# Patient Record
Sex: Male | Born: 1950
Health system: Southern US, Community
[De-identification: ages and names within clinical notes are randomized; demographics above are authoritative.]

## PROBLEM LIST (undated history)

## (undated) DIAGNOSIS — I10 Essential (primary) hypertension: Secondary | ICD-10-CM

## (undated) DIAGNOSIS — C9590 Leukemia, unspecified not having achieved remission: Secondary | ICD-10-CM

## (undated) DIAGNOSIS — E119 Type 2 diabetes mellitus without complications: Secondary | ICD-10-CM

## (undated) DIAGNOSIS — E785 Hyperlipidemia, unspecified: Secondary | ICD-10-CM

## (undated) HISTORY — PX: TOOTH EXTRACTION: SUR596

## (undated) HISTORY — PX: TONSILLECTOMY: SUR1361

## (undated) HISTORY — DX: Type 2 diabetes mellitus without complications: E11.9

## (undated) HISTORY — PX: CATARACT EXTRACTION: SUR2

## (undated) HISTORY — DX: Hyperlipidemia, unspecified: E78.5

---

## 2016-05-11 DIAGNOSIS — E119 Type 2 diabetes mellitus without complications: Secondary | ICD-10-CM | POA: Diagnosis not present

## 2016-05-22 ENCOUNTER — Ambulatory Visit
Admission: EM | Admit: 2016-05-22 | Discharge: 2016-05-22 | Disposition: A | Discharge status: Home / Self Care | Payer: PPO | Attending: Emergency Medicine | Admitting: Emergency Medicine

## 2016-05-22 DIAGNOSIS — I872 Venous insufficiency (chronic) (peripheral): Secondary | ICD-10-CM | POA: Diagnosis not present

## 2016-05-22 DIAGNOSIS — L03115 Cellulitis of right lower limb: Secondary | ICD-10-CM | POA: Diagnosis not present

## 2016-05-22 DIAGNOSIS — L97229 Non-pressure chronic ulcer of left calf with unspecified severity: Secondary | ICD-10-CM | POA: Diagnosis not present

## 2016-05-22 DIAGNOSIS — T148XXA Other injury of unspecified body region, initial encounter: Secondary | ICD-10-CM

## 2016-05-22 DIAGNOSIS — L97219 Non-pressure chronic ulcer of right calf with unspecified severity: Secondary | ICD-10-CM | POA: Diagnosis not present

## 2016-05-22 HISTORY — DX: Leukemia, unspecified not having achieved remission: C95.90

## 2016-05-22 MED ORDER — CEFTRIAXONE SODIUM 1 G IJ SOLR
1.0000 g | Freq: Once | INTRAMUSCULAR | Status: AC
Start: 2016-05-22 — End: 2016-05-22
  Administered 2016-05-22: 1 g via INTRAMUSCULAR

## 2016-05-22 MED ORDER — SILVER SULFADIAZINE 1 % EX CREA
TOPICAL_CREAM | Freq: Once | CUTANEOUS | Status: AC
  Administered 2016-05-22: 16:00:00 via TOPICAL

## 2016-05-22 MED ORDER — DOXYCYCLINE HYCLATE 100 MG PO CAPS: 100.0000 mg | ORAL_CAPSULE | Freq: Two times a day (BID) | ORAL | 0 refills | Status: DC

## 2016-05-22 NOTE — Discharge Instructions (Signed)
Follow-up with the [facility] ER tomorrow. You will need to get involved with a wound care center, and you can either do this through the [place] or you can go to [facility] wound care center. This will take some time to heal. Finish the antibiotics unless your doctor tells you to stop. Go to the ER if you get acutely worse tonight.

## 2016-05-22 NOTE — ED Triage Notes (Signed)
Pt has large blisters on his lower legs that are oozing fluid. He does have peripheral vascular issues. He is a diabetic.

## 2016-05-22 NOTE — ED Provider Notes (Signed)
HPI  SUBJECTIVE:  [name] is a 66 y.o. male who presents with  Bilateral lower extremity "water blisters" starting 2 days ago. He reports clear drainage. He reports intermittent minutes long leg pain described as soreness the right lower extremity. Is worse with walking. He tried draining the blister multiple times. No alleviating factors. He reports redness in his right lower extremity, but no fevers, increased temperature, body aches are generalized weakness. He has had similar blisters like this before, but they have been smaller and has not had localized erythema. He denies increased swelling in his legs recently.  Past medical history of leukemia, diabetes, hypertension, , lower extremity edema for he takes Lasix. No history of MRSA, recurrent skin infections, peripheral vascular disease, hypercholesterolemia, coronary artery disease, congestive heart failure, peripheral neuropathy. PMD: Dr. Arnoldo Morale at the [facility].   Past Medical History:  Diagnosis Date  . Leukemia ([place])     History reviewed. No pertinent surgical history.  History reviewed. No pertinent family history.  Social History  Substance Use Topics  . Smoking status: Former Research scientist (life sciences)  . Smokeless tobacco: Never Used  . Alcohol use No    No current facility-administered medications for this encounter.   Current Outpatient Prescriptions:  .  bisoprolol-hydrochlorothiazide (ZIAC) 10-6.25 MG tablet, Take 1 tablet by mouth daily., Disp: , Rfl:  .  furosemide (LASIX) 20 MG tablet, Take 20 mg by mouth., Disp: , Rfl:  .  hydrALAZINE (APRESOLINE) 10 MG tablet, Take 10 mg by mouth 3 (three) times daily., Disp: , Rfl:  .  lisinopril-hydrochlorothiazide (PRINZIDE,ZESTORETIC) 20-12.5 MG tablet, Take 1 tablet by mouth daily., Disp: , Rfl:  .  metFORMIN (GLUCOPHAGE) 1000 MG tablet, Take 1,000 mg by mouth 2 (two) times daily with a meal., Disp: , Rfl:  .  pioglitazone (ACTOS) 15 MG tablet, Take 15 mg by mouth daily., Disp: , Rfl:   .  pravastatin (PRAVACHOL) 80 MG tablet, Take 80 mg by mouth daily., Disp: , Rfl:  .  doxycycline (VIBRAMYCIN) 100 MG capsule, Take 1 capsule (100 mg total) by mouth 2 (two) times daily. X 7 days, Disp: 14 capsule, Rfl: 0  No Known Allergies   ROS  As noted in HPI.   Physical Exam  BP (!) 146/66 (BP Location: Left Arm)   Pulse (!) 56   Temp 97.9 F (36.6 C) (Oral)   Resp 18   Ht 6\' 1"  (1.854 m)   Wt (!) 305 lb (138.3 kg)   SpO2 97%   BMI 40.24 kg/m   Constitutional: Well developed, well nourished, no acute distress Eyes:  EOMI, conjunctiva normal bilaterally HENT: Normocephalic, atraumatic,mucus membranes moist Respiratory: Normal inspiratory effort Cardiovascular: Normal rate GI: nondistended skin:Tender Flaccid blister right lower extremity measuring 13 x 15 cm with diffuse distal erythema. He has bilateral foot edema. Right foot is erythematous. Bilateral DP pulses 2+. Slightly increased temperature in the right lower extremity. Marked area of erythema with a marker for reference. 3 x3.5 centimeter flaccid blister draining serous fluid with no surrounding erythema on his left lower extremity. Marked this with a marker for reference.         Musculoskeletal: no deformities Neurologic: Alert & oriented x 3, no focal neuro deficits Psychiatric: Speech and behavior appropriate   ED Course   Medications  cefTRIAXone (ROCEPHIN) injection 1 g (1 g Intramuscular Given 05/22/16 1625)  silver sulfADIAZINE (SILVADENE) 1 % cream ( Topical Given 05/22/16 1625)    Orders Placed This Encounter  Procedures  . Wound  or Superficial Culture    Standing Status:   Standing    Number of Occurrences:   1    No results found for this or any previous visit (from the past 24 hour(s)). No results found.  ED Clinical Impression  Blister  Venous stasis ulcer of left calf without varicose veins, unspecified ulcer stage (HCC)  Venous stasis ulcer of right calf without varicose  veins, unspecified ulcer stage (HCC)  Cellulitis of right lower extremity   ED Assessment/Plan  presentation most consistent with blisters due to venous stasis. He appears to have a cellulitis of the right lower extremity. Giving 1 g Rocephin will start him on doxycycline. Sending off wound culture. He needs to follow-up with the [facility] either tomorrow or the next day, or he can follow up with [facility] wound care center. Advised patient that this will take some time to heal. Advised elevation, compression hose. He is to go to the ER for any acute worsening of his symptoms.   Discussed  MDM, plan and followup with patient. Discussed sn/sx that should prompt return to the ED. Patient agrees with plan.   Meds ordered this encounter  Medications  . metFORMIN (GLUCOPHAGE) 1000 MG tablet    Sig: Take 1,000 mg by mouth 2 (two) times daily with a meal.  . pioglitazone (ACTOS) 15 MG tablet    Sig: Take 15 mg by mouth daily.  . hydrALAZINE (APRESOLINE) 10 MG tablet    Sig: Take 10 mg by mouth 3 (three) times daily.  . bisoprolol-hydrochlorothiazide (ZIAC) 10-6.25 MG tablet    Sig: Take 1 tablet by mouth daily.  . furosemide (LASIX) 20 MG tablet    Sig: Take 20 mg by mouth.  Marland Kitchen lisinopril-hydrochlorothiazide (PRINZIDE,ZESTORETIC) 20-12.5 MG tablet    Sig: Take 1 tablet by mouth daily.  . pravastatin (PRAVACHOL) 80 MG tablet    Sig: Take 80 mg by mouth daily.  . cefTRIAXone (ROCEPHIN) injection 1 g  . doxycycline (VIBRAMYCIN) 100 MG capsule    Sig: Take 1 capsule (100 mg total) by mouth 2 (two) times daily. X 7 days    Dispense:  14 capsule    Refill:  0  . silver sulfADIAZINE (SILVADENE) 1 % cream    *This clinic note was created using [employer]. Therefore, there may be occasional mistakes despite careful proofreading.  ?   Melynda Ripple, MD 05/22/16 [phone]

## 2016-05-23 ENCOUNTER — Encounter: Payer: Self-pay | Admitting: *Deleted

## 2016-05-23 ENCOUNTER — Encounter: Payer: PPO | Attending: Surgery | Admitting: Surgery

## 2016-05-23 ENCOUNTER — Ambulatory Visit
Admission: EM | Admit: 2016-05-23 | Discharge: 2016-05-23 | Disposition: A | Discharge status: Home / Self Care | Payer: PPO | Attending: Family Medicine | Admitting: Family Medicine

## 2016-05-23 DIAGNOSIS — L97209 Non-pressure chronic ulcer of unspecified calf with unspecified severity: Secondary | ICD-10-CM

## 2016-05-23 DIAGNOSIS — I890 Lymphedema, not elsewhere classified: Secondary | ICD-10-CM | POA: Diagnosis not present

## 2016-05-23 DIAGNOSIS — Z79899 Other long term (current) drug therapy: Secondary | ICD-10-CM | POA: Insufficient documentation

## 2016-05-23 DIAGNOSIS — E1140 Type 2 diabetes mellitus with diabetic neuropathy, unspecified: Secondary | ICD-10-CM | POA: Insufficient documentation

## 2016-05-23 DIAGNOSIS — I110 Hypertensive heart disease with heart failure: Secondary | ICD-10-CM | POA: Diagnosis not present

## 2016-05-23 DIAGNOSIS — I509 Heart failure, unspecified: Secondary | ICD-10-CM | POA: Insufficient documentation

## 2016-05-23 DIAGNOSIS — I83002 Varicose veins of unspecified lower extremity with ulcer of calf: Secondary | ICD-10-CM

## 2016-05-23 DIAGNOSIS — L97211 Non-pressure chronic ulcer of right calf limited to breakdown of skin: Secondary | ICD-10-CM | POA: Diagnosis not present

## 2016-05-23 DIAGNOSIS — L03115 Cellulitis of right lower limb: Secondary | ICD-10-CM | POA: Diagnosis not present

## 2016-05-23 DIAGNOSIS — I2510 Atherosclerotic heart disease of native coronary artery without angina pectoris: Secondary | ICD-10-CM | POA: Diagnosis not present

## 2016-05-23 DIAGNOSIS — Z7984 Long term (current) use of oral hypoglycemic drugs: Secondary | ICD-10-CM | POA: Diagnosis not present

## 2016-05-23 DIAGNOSIS — Z87891 Personal history of nicotine dependence: Secondary | ICD-10-CM | POA: Insufficient documentation

## 2016-05-23 DIAGNOSIS — E11622 Type 2 diabetes mellitus with other skin ulcer: Secondary | ICD-10-CM | POA: Insufficient documentation

## 2016-05-23 DIAGNOSIS — L97221 Non-pressure chronic ulcer of left calf limited to breakdown of skin: Secondary | ICD-10-CM | POA: Insufficient documentation

## 2016-05-23 HISTORY — DX: Essential (primary) hypertension: I10

## 2016-05-23 MED ORDER — CEFTRIAXONE SODIUM 1 G IJ SOLR
1.0000 g | Freq: Once | INTRAMUSCULAR | Status: AC
  Administered 2016-05-23: 1 g via INTRAMUSCULAR

## 2016-05-23 NOTE — ED Triage Notes (Signed)
Seen here yesterday for cellulitis to both lower legs. Here for wound check.

## 2016-05-23 NOTE — ED Provider Notes (Signed)
[facility]    CSN: [id number] Arrival date & time: 05/23/16  0848     History   Chief Complaint Chief Complaint  Patient presents with  . Wound Check    HPI [name] is a 66 y.o. male.   66 yo diabetic male seen here yesterday with a venous stasis ulcers and right lower extremity cellulitis. He was given Rocephin 1gm IM and started on oral antibiotic. Here for wound recheck. States it feels slightly improved today. Denies any fevers, chills.    The history is provided by the patient.  Wound Check     Past Medical History:  Diagnosis Date  . Hypertension   . Leukemia ([place])     There are no active problems to display for this patient.   History reviewed. No pertinent surgical history.     Home Medications    Prior to Admission medications   Medication Sig Start Date End Date Taking? Authorizing Provider  bisoprolol-hydrochlorothiazide (ZIAC) 10-6.25 MG tablet Take 1 tablet by mouth daily.   Yes Historical Provider, MD  doxycycline (VIBRAMYCIN) 100 MG capsule Take 1 capsule (100 mg total) by mouth 2 (two) times daily. X 7 days 05/22/16  Yes Melynda Ripple, MD  furosemide (LASIX) 20 MG tablet Take 20 mg by mouth.   Yes Historical Provider, MD  hydrALAZINE (APRESOLINE) 10 MG tablet Take 10 mg by mouth 3 (three) times daily.   Yes Historical Provider, MD  lisinopril-hydrochlorothiazide (PRINZIDE,ZESTORETIC) 20-12.5 MG tablet Take 1 tablet by mouth daily.   Yes Historical Provider, MD  metFORMIN (GLUCOPHAGE) 1000 MG tablet Take 1,000 mg by mouth 2 (two) times daily with a meal.   Yes Historical Provider, MD  pioglitazone (ACTOS) 15 MG tablet Take 15 mg by mouth daily.   Yes Historical Provider, MD  pravastatin (PRAVACHOL) 80 MG tablet Take 80 mg by mouth daily.   Yes Historical Provider, MD    Family History History reviewed. No pertinent family history.  Social History Social History  Substance Use Topics  . Smoking status: Former Research scientist (life sciences)  .  Smokeless tobacco: Never Used  . Alcohol use No     Allergies   Patient has no known allergies.   Review of Systems Review of Systems   Physical Exam Triage Vital Signs ED Triage Vitals  Enc Vitals Group     BP 05/23/16 0902 (!) 135/55     Pulse Rate 05/23/16 0902 60     Resp 05/23/16 0902 16     Temp 05/23/16 0902 98.8 F (37.1 C)     Temp Source 05/23/16 0902 Oral     SpO2 05/23/16 0902 95 %     Weight 05/23/16 0905 (!) 310 lb (140.6 kg)     Height 05/23/16 0905 6\' 1"  (1.854 m)     Head Circumference --      Peak Flow --      Pain Score --      Pain Loc --      Pain Edu? --      Excl. in [place]? --    No data found.   Updated Vital Signs BP (!) 135/55 (BP Location: Left Arm)   Pulse 60   Temp 98.8 F (37.1 C) (Oral)   Resp 16   Ht 6\' 1"  (1.854 m)   Wt (!) 310 lb (140.6 kg)   SpO2 95%   BMI 40.90 kg/m   Visual Acuity Right Eye Distance:   Left Eye Distance:   Bilateral Distance:  Right Eye Near:   Left Eye Near:    Bilateral Near:     Physical Exam  Constitutional: He appears well-developed and well-nourished. No distress.  Skin: He is not diaphoretic.  Large venous stasis blister and ulcer with surrounding tenderness and skin blanchable erythema on right lower leg; Venous stasis blister/ulcer to left posterior lower leg  Nursing note and vitals reviewed.    UC Treatments / Results  Labs (all labs ordered are listed, but only abnormal results are displayed) Labs Reviewed - No data to display  EKG  EKG Interpretation None       Radiology No results found.  Procedures Procedures (including critical care time)  Medications Ordered in UC Medications  cefTRIAXone (ROCEPHIN) injection 1 g (1 g Intramuscular Given 05/23/16 0931)     Initial Impression / Assessment and Plan / UC Course  I have reviewed the triage vital signs and the nursing notes.  Pertinent labs & imaging results that were available during my care of the patient were  reviewed by me and considered in my medical decision making (see chart for details).  Clinical Course       Final Clinical Impressions(s) / UC Diagnoses   Final diagnoses:  Venous stasis ulcer of calf, unspecified laterality, unspecified ulcer stage, unspecified whether varicose veins present (HCC)  Cellulitis of right leg    New Prescriptions New Prescriptions   No medications on file   1. diagnosis reviewed with patient 2. Continue current oral antibiotic 3. Recommend follow up at wound care clinic ([facility]); appt today at 1:30    Norval Gable, MD 05/23/16 [phone]

## 2016-05-23 NOTE — Progress Notes (Signed)
NEVYN, GARG ([id number]) Visit Report for 05/23/2016 Allergy List Details Patient Name: [name], [name] Date of Service: 05/23/2016 1:30 PM Medical Record Number: [id number] Patient Account Number: [id number] Date of Birth/Sex: 1951-02-02 (66 y.o. Male) Treating RN: Montey Hora Primary Care Physician: PATIENT, NO Other Clinician: Referring Physician: Treating Physician/Extender: Frann Rider in Treatment: 0 Allergies Active Allergies No Known Drug Allergies Allergy Notes Electronic Signature(s) Signed: 05/23/2016 3:52:38 PM By: Montey Hora Entered By: Montey Hora on 05/23/2016 13:39:34 [name] ([id number]) -------------------------------------------------------------------------------- [place] Details Patient Name: [name] Date of Service: 05/23/2016 1:30 PM Medical Record Number: [id number] Patient Account Number: [id number] Date of Birth/Sex: 1951/04/29 (66 y.o. Male) Treating RN: Montey Hora Primary Care Physician: PATIENT, NO Other Clinician: Referring Physician: Treating Physician/Extender: Frann Rider in Treatment: 0 Visit Information Patient Arrived: Ambulatory Arrival Time: 13:35 Accompanied By: self Transfer Assistance: None Patient Identification Verified: Yes Secondary Verification Process Yes Completed: Patient Has Alerts: Yes Patient Alerts: DMII NO ABI RIGHT R/T PAIN Electronic Signature(s) Signed: 05/23/2016 3:52:38 PM By: Montey Hora Entered By: Montey Hora on 05/23/2016 14:18:50 [name] ([id number]) -------------------------------------------------------------------------------- Clinic Level of Care Assessment Details Patient Name: [name] Date of Service: 05/23/2016 1:30 PM Medical Record Number: [id number] Patient Account Number: [id number] Date of Birth/Sex: 18-Jun-1950 (66 y.o. Male) Treating RN: Montey Hora Primary Care Physician: PATIENT, NO Other Clinician: Referring  Physician: Treating Physician/Extender: Frann Rider in Treatment: 0 Clinic Level of Care Assessment Items TOOL 2 Quantity Score []  - Use when only an EandM is performed on the INITIAL visit 0 ASSESSMENTS - Nursing Assessment / Reassessment X - General Physical Exam (combine w/ comprehensive assessment (listed just 1 20 below) when performed on new pt. evals) X - Comprehensive Assessment (HX, ROS, Risk Assessments, Wounds Hx, etc.) 1 25 ASSESSMENTS - Wound and Skin Assessment / Reassessment []  - Simple Wound Assessment / Reassessment - one wound 0 X - Complex Wound Assessment / Reassessment - multiple wounds 2 5 []  - Dermatologic / Skin Assessment (not related to wound area) 0 ASSESSMENTS - Ostomy and/or Continence Assessment and Care []  - Incontinence Assessment and Management 0 []  - Ostomy Care Assessment and Management (repouching, etc.) 0 PROCESS - Coordination of Care X - Simple Patient / Family Education for ongoing care 1 15 []  - Complex (extensive) Patient / Family Education for ongoing care 0 X - Staff obtains Programmer, systems, Records, Test Results / Process Orders 1 10 []  - Staff telephones HHA, Nursing Homes / Clarify orders / etc 0 []  - Routine Transfer to another Facility (non-emergent condition) 0 []  - Routine Hospital Admission (non-emergent condition) 0 []  - New Admissions / Biomedical engineer / Ordering NPWT, Apligraf, etc. 0 []  - Emergency Hospital Admission (emergent condition) 0 X - Simple Discharge Coordination 1 10 [name], [name] ([id number]) []  - Complex (extensive) Discharge Coordination 0 PROCESS - Special Needs []  - Pediatric / Minor Patient Management 0 []  - Isolation Patient Management 0 []  - Hearing / Language / Visual special needs 0 []  - Assessment of Community assistance (transportation, D/C planning, etc.) 0 []  - Additional assistance / Altered mentation 0 []  - Support Surface(s) Assessment (bed, cushion, seat, etc.) 0 INTERVENTIONS - Wound  Cleansing / Measurement X - Wound Imaging (photographs - any number of wounds) 1 5 []  - Wound Tracing (instead of photographs) 0 []  - Simple Wound Measurement - one wound 0 X - Complex Wound Measurement - multiple wounds 2 5 []  - Simple Wound Cleansing - one wound 0 X - Complex Wound  Cleansing - multiple wounds 2 5 INTERVENTIONS - Wound Dressings []  - Small Wound Dressing one or multiple wounds 0 []  - Medium Wound Dressing one or multiple wounds 0 X - Large Wound Dressing one or multiple wounds 2 20 []  - Application of Medications - injection 0 INTERVENTIONS - Miscellaneous []  - External ear exam 0 []  - Specimen Collection (cultures, biopsies, blood, body fluids, etc.) 0 []  - Specimen(s) / Culture(s) sent or taken to Lab for analysis 0 []  - Patient Transfer (multiple staff / Harrel Lemon Lift / Similar devices) 0 []  - Simple Staple / Suture removal (25 or less) 0 []  - Complex Staple / Suture removal (26 or more) 0 [name], [name] ([id number]) []  - Hypo / Hyperglycemic Management (close monitor of Blood Glucose) 0 X - Ankle / Brachial Index (ABI) - do not check if billed separately 1 15 Has the patient been seen at the hospital within the last three years: Yes Total Score: 170 Level Of Care: New/Established - Level 5 Electronic Signature(s) Signed: 05/23/2016 3:52:38 PM By: Montey Hora Entered By: Montey Hora on 05/23/2016 14:35:46 [name] ([id number]) -------------------------------------------------------------------------------- Encounter Discharge Information Details Patient Name: [name] Date of Service: 05/23/2016 1:30 PM Medical Record Number: [id number] Patient Account Number: [id number] Date of Birth/Sex: 1950/09/22 (66 y.o. Male) Treating RN: Montey Hora Primary Care Physician: PATIENT, NO Other Clinician: Referring Physician: Treating Physician/Extender: Frann Rider in Treatment: 0 Encounter Discharge Information Items Discharge Pain Level:  0 Discharge Condition: Stable Ambulatory Status: Ambulatory Discharge Destination: Home Transportation: Private Auto Accompanied By: self Schedule Follow-up Appointment: Yes Medication Reconciliation completed No and provided to Patient/Care Tamee Battin: Patient Clinical Summary of Care: Declined Electronic Signature(s) Signed: 05/23/2016 3:03:24 PM By: Sharon Mt Entered By: Sharon Mt on 05/23/2016 15:03:24 [name] ([id number]) -------------------------------------------------------------------------------- Lower Extremity Assessment Details Patient Name: [name] Date of Service: 05/23/2016 1:30 PM Medical Record Number: [id number] Patient Account Number: [id number] Date of Birth/Sex: [birth year] (66 y.o. Male) Treating RN: Montey Hora Primary Care Physician: PATIENT, NO Other Clinician: Referring Physician: Treating Physician/Extender: Frann Rider in Treatment: 0 Edema Assessment Assessed: [Left: No] [Right: No] Edema: [Left: Yes] [Right: Yes] Calf Left: Right: Point of Measurement: 34 cm From Medial Instep 41.2 cm 42.5 cm Ankle Left: Right: Point of Measurement: 12 cm From Medial Instep 29 cm 29.5 cm Vascular Assessment Pulses: Dorsalis Pedis Palpable: [Left:Yes] [Right:Yes] Doppler Audible: [Left:Yes] [Right:Yes] Posterior Tibial Palpable: [Left:No] [Right:No] Doppler Audible: [Left:Yes] [Right:Yes] Extremity colors, hair growth, and conditions: Extremity Color: [Left:Hyperpigmented] [Right:Hyperpigmented] Hair Growth on Extremity: [Left:No] [Right:No] Temperature of Extremity: [Left:Warm] [Right:Warm] Capillary Refill: [Left:< 3 seconds] [Right:< 3 seconds] Blood Pressure: Brachial: [Left:124] Dorsalis Pedis: 146 [Left:Dorsalis Pedis:] Ankle: Posterior Tibial: 170 [Left:Posterior Tibial: 1.37] Toe Nail Assessment Left: Right: Thick: Yes Yes Discolored: No No Deformed: No No Improper Length and Hygiene: No No MAYNARD, PINTA  ([id number]) Electronic Signature(s) Signed: 05/23/2016 3:52:38 PM By: Montey Hora Entered By: Montey Hora on 05/23/2016 14:18:33 [name] ([id number]) -------------------------------------------------------------------------------- Multi Wound Chart Details Patient Name: [name] Date of Service: 05/23/2016 1:30 PM Medical Record Number: [id number] Patient Account Number: [id number] Date of Birth/Sex: 23-Aug-1950 (66 y.o. Male) Treating RN: Montey Hora Primary Care Physician: PATIENT, NO Other Clinician: Referring Physician: Treating Physician/Extender: Frann Rider in Treatment: 0 Vital Signs Height(in): 73 Pulse(bpm): 61 Weight(lbs): 326 Blood Pressure 126/49 (mmHg): Body Mass Index(BMI): 43 Temperature(F): 98.1 Respiratory Rate 16 (breaths/min): Photos: [N/A:N/A] Wound Location: Right Lower Leg - Medial Left Lower Leg - Medial N/A Wounding Event: Blister Blister N/A Primary  Etiology: Venous Leg Ulcer Venous Leg Ulcer N/A Comorbid History: Hypertension, Type II Hypertension, Type II N/A Diabetes, Neuropathy, Diabetes, Neuropathy, Received Chemotherapy Received Chemotherapy Date Acquired: 05/20/2016 05/20/2016 N/A Weeks of Treatment: 0 0 N/A Wound Status: Open Open N/A Measurements L x W x D 12.3x15.5x0.1 2.3x2.2x0.1 N/A (cm) Area (cm) : 149.736 3.974 N/A Volume (cm) : 14.974 0.397 N/A % Reduction in Area: 0.00% N/A N/A % Reduction in Volume: 0.00% N/A N/A Classification: Partial Thickness Partial Thickness N/A HBO Classification: Grade 1 Grade 1 N/A Exudate Amount: Large Large N/A Exudate Type: Serous Serous N/A Exudate Color: amber amber N/A Wound Margin: Flat and Intact Flat and Intact N/A Granulation Amount: Large (67-100%) Large (67-100%) N/A Granulation Quality: Red, Pink Pink N/A [place], Banyan ([id number]) Necrotic Amount: Small (1-33%) None Present (0%) N/A Exposed Structures: Fascia: No Fascia: No N/A Fat: No Fat: No Tendon:  No Tendon: No Muscle: No Muscle: No Joint: No Joint: No Bone: No Bone: No Limited to Skin Limited to Skin Breakdown Breakdown Epithelialization: Small (1-33%) Small (1-33%) N/A Periwound Skin Texture: Edema: No Edema: No N/A Excoriation: No Excoriation: No Induration: No Induration: No Callus: No Callus: No Crepitus: No Crepitus: No Fluctuance: No Fluctuance: No Friable: No Friable: No Rash: No Rash: No Scarring: No Scarring: No Periwound Skin Moist: Yes Moist: Yes N/A Moisture: Maceration: No Maceration: No Dry/Scaly: No Dry/Scaly: No Periwound Skin Color: Erythema: Yes Atrophie Blanche: No N/A Atrophie Blanche: No Cyanosis: No Cyanosis: No Ecchymosis: No Ecchymosis: No Erythema: No Hemosiderin Staining: No Hemosiderin Staining: No Mottled: No Mottled: No Pallor: No Pallor: No Rubor: No Rubor: No Erythema Location: Circumferential N/A N/A Tenderness on Yes Yes N/A Palpation: Wound Preparation: Ulcer Cleansing: Ulcer Cleansing: N/A Rinsed/Irrigated with Rinsed/Irrigated with Saline Saline Topical Anesthetic Topical Anesthetic Applied: Other: lidocaine Applied: Other: lidocaine 4% 4% Treatment Notes Electronic Signature(s) Signed: 05/23/2016 2:39:08 PM By: Christin Fudge MD, FACS Entered By: Christin Fudge on 05/23/2016 14:39:08 [name] ([id number]) -------------------------------------------------------------------------------- [place] Details Patient Name: [name] Date of Service: 05/23/2016 1:30 PM Medical Record Number: [id number] Patient Account Number: [id number] Date of Birth/Sex: [birth year] (65 y.o. Male) Treating RN: Montey Hora Primary Care Physician: PATIENT, NO Other Clinician: Referring Physician: Treating Physician/Extender: Frann Rider in Treatment: 0 Active Inactive Abuse / Safety / Falls / Self Care Management Nursing Diagnoses: Potential for falls Goals: Patient will remain injury  free Date Initiated: 05/23/2016 Goal Status: Active Interventions: Assess fall risk on admission and as needed Notes: Orientation to the Wound Care Program Nursing Diagnoses: Knowledge deficit related to the wound healing center program Goals: Patient/caregiver will verbalize understanding of the [facility] Date Initiated: 05/23/2016 Goal Status: Active Interventions: Provide education on orientation to the wound center Notes: Venous Leg Ulcer Nursing Diagnoses: Potential for venous Insuffiency (use before diagnosis confirmed) Goals: Non-invasive venous studies are completed as ordered Date Initiated: 05/23/2016 NYSEAN, PRATS ([id number]) Goal Status: Active Patient will maintain optimal edema control Date Initiated: 05/23/2016 Goal Status: Active Interventions: Assess peripheral edema status every visit. Compression as ordered Notes: Wound/Skin Impairment Nursing Diagnoses: Impaired tissue integrity Goals: Patient/caregiver will verbalize understanding of skin care regimen Date Initiated: 05/23/2016 Goal Status: Active Ulcer/skin breakdown will have a volume reduction of 30% by week 4 Date Initiated: 05/23/2016 Goal Status: Active Ulcer/skin breakdown will have a volume reduction of 50% by week 8 Date Initiated: 05/23/2016 Goal Status: Active Ulcer/skin breakdown will have a volume reduction of 80% by week 12 Date Initiated: 05/23/2016 Goal Status: Active Ulcer/skin breakdown  will heal within 14 weeks Date Initiated: 05/23/2016 Goal Status: Active Interventions: Assess patient/caregiver ability to obtain necessary supplies Assess patient/caregiver ability to perform ulcer/skin care regimen upon admission and as needed Assess ulceration(s) every visit Notes: Electronic Signature(s) Signed: 05/23/2016 3:52:38 PM By: Montey Hora Entered By: Montey Hora on 05/23/2016 14:29:19 [name]  ([id number]) -------------------------------------------------------------------------------- Pain Assessment Details Patient Name: [name] Date of Service: 05/23/2016 1:30 PM Medical Record Number: [id number] Patient Account Number: [id number] Date of Birth/Sex: 1951/01/06 (66 y.o. Male) Treating RN: Montey Hora Primary Care Physician: PATIENT, NO Other Clinician: Referring Physician: Treating Physician/Extender: Frann Rider in Treatment: 0 Active Problems Location of Pain Severity and Description of Pain Patient Has Paino Yes Site Locations Pain Location: Pain in Ulcers With Dressing Change: Yes Duration of the Pain. Constant / Intermittento Constant Pain Management and Medication Current Pain Management: Notes Topical or injectable lidocaine is offered to patient for acute pain when surgical debridement is performed. If needed, Patient is instructed to use over the counter pain medication for the following 24-48 hours after debridement. Wound care MDs do not prescribed pain medications. Patient has chronic pain or uncontrolled pain. Patient has been instructed to make an appointment with their Primary Care Physician for pain management. Electronic Signature(s) Signed: 05/23/2016 3:52:38 PM By: Montey Hora Entered By: Montey Hora on 05/23/2016 13:39:55 [name] ([id number]) -------------------------------------------------------------------------------- Patient/Caregiver Education Details Patient Name: [name] Date of Service: 05/23/2016 1:30 PM Medical Record Number: [id number] Patient Account Number: [id number] Date of Birth/Gender: 1950/10/26 (65 y.o. Male) Treating RN: Montey Hora Primary Care Physician: PATIENT, NO Other Clinician: Referring Physician: Treating Physician/Extender: Frann Rider in Treatment: 0 Education Assessment Education Provided To: Patient Education Topics Provided Venous: Handouts: Other: leg  elevation Methods: Explain/Verbal Responses: State content correctly Electronic Signature(s) Signed: 05/23/2016 3:52:38 PM By: Montey Hora Entered By: Montey Hora on 05/23/2016 14:30:59 [name] ([id number]) -------------------------------------------------------------------------------- Wound Assessment Details Patient Name: [name] Date of Service: 05/23/2016 1:30 PM Medical Record Number: [id number] Patient Account Number: [id number] Date of Birth/Sex: [birth year] (66 y.o. Male) Treating RN: Montey Hora Primary Care Physician: PATIENT, NO Other Clinician: Referring Physician: Treating Physician/Extender: Frann Rider in Treatment: 0 Wound Status Wound Number: 1 Primary Venous Leg Ulcer Etiology: Wound Location: Right Lower Leg - Medial Wound Open Wounding Event: Blister Status: Date Acquired: 05/20/2016 Comorbid Hypertension, Type II Diabetes, Weeks Of Treatment: 0 History: Neuropathy, Received Chemotherapy Clustered Wound: No Photos Wound Measurements Length: (cm) 12.3 Width: (cm) 15.5 Depth: (cm) 0.1 Area: (cm) 149.736 Volume: (cm) 14.974 % Reduction in Area: 0% % Reduction in Volume: 0% Epithelialization: Small (1-33%) Tunneling: No Undermining: No Wound Description Classification: Partial Thickness Foul Odor Af Diabetic Severity (Wagner): Grade 1 Wound Margin: Flat and Intact Exudate Amount: Large Exudate Type: Serous Exudate Color: amber ter Cleansing: No Wound Bed Granulation Amount: Large (67-100%) Exposed Structure Granulation Quality: Red, Pink Fascia Exposed: No Necrotic Amount: Small (1-33%) Fat Layer Exposed: No Necrotic Quality: Adherent Slough Tendon Exposed: No [place], [place] ([id number]) Muscle Exposed: No Joint Exposed: No Bone Exposed: No Limited to Skin Breakdown Periwound Skin Texture Texture Color No Abnormalities Noted: No No Abnormalities Noted: No Callus: No Atrophie Blanche: No Crepitus:  No Cyanosis: No Excoriation: No Ecchymosis: No Fluctuance: No Erythema: Yes Friable: No Erythema Location: Circumferential Induration: No Hemosiderin Staining: No Localized Edema: No Mottled: No Rash: No Pallor: No Scarring: No Rubor: No Moisture Temperature / Pain No Abnormalities Noted: No Tenderness on Palpation: Yes Dry / Scaly: No Maceration: No Moist: Yes  Wound Preparation Ulcer Cleansing: Rinsed/Irrigated with Saline Topical Anesthetic Applied: Other: lidocaine 4%, Treatment Notes Wound #1 (Right, Medial Lower Leg) 1. Cleansed with: Clean wound with Normal Saline 2. Anesthetic Topical Lidocaine 4% cream to wound bed prior to debridement 4. Dressing Applied: Aquacel Ag Other dressing (specify in notes) 5. Secondary Dressing Applied ABD Pad 7. Secured with Tape Notes xtrasorb, kerlix and coban wrap from toes to 3cm below the knee Electronic Signature(s) Signed: 05/23/2016 3:52:38 PM By: Montey Hora Entered By: Montey Hora on 05/23/2016 14:07:15 [name] ([id number]) [place], [name] ([id number]) -------------------------------------------------------------------------------- Wound Assessment Details Patient Name: [name] Date of Service: 05/23/2016 1:30 PM Medical Record Number: [id number] Patient Account Number: [id number] Date of Birth/Sex: Jan 16, 1951 (66 y.o. Male) Treating RN: Montey Hora Primary Care Physician: PATIENT, NO Other Clinician: Referring Physician: Treating Physician/Extender: Frann Rider in Treatment: 0 Wound Status Wound Number: 2 Primary Venous Leg Ulcer Etiology: Wound Location: Left Lower Leg - Medial Wound Open Wounding Event: Blister Status: Date Acquired: 05/20/2016 Comorbid Hypertension, Type II Diabetes, Weeks Of Treatment: 0 History: Neuropathy, Received Chemotherapy Clustered Wound: No Photos Wound Measurements Length: (cm) 2.3 Width: (cm) 2.2 Depth: (cm) 0.1 Area: (cm) 3.974 Volume:  (cm) 0.397 % Reduction in Area: % Reduction in Volume: Epithelialization: Small (1-33%) Tunneling: No Undermining: No Wound Description Classification: Partial Thickness Foul Odor A Diabetic Severity (Wagner): Grade 1 Wound Margin: Flat and Intact Exudate Amount: Large Exudate Type: Serous Exudate Color: amber fter Cleansing: No Wound Bed Granulation Amount: Large (67-100%) Exposed Structure Granulation Quality: Pink Fascia Exposed: No Necrotic Amount: None Present (0%) Fat Layer Exposed: No Tendon Exposed: No MARKEITH, BORDONARO ([id number]) Muscle Exposed: No Joint Exposed: No Bone Exposed: No Limited to Skin Breakdown Periwound Skin Texture Texture Color No Abnormalities Noted: No No Abnormalities Noted: No Callus: No Atrophie Blanche: No Crepitus: No Cyanosis: No Excoriation: No Ecchymosis: No Fluctuance: No Erythema: No Friable: No Hemosiderin Staining: No Induration: No Mottled: No Localized Edema: No Pallor: No Rash: No Rubor: No Scarring: No Temperature / Pain Moisture Tenderness on Palpation: Yes No Abnormalities Noted: No Dry / Scaly: No Maceration: No Moist: Yes Wound Preparation Ulcer Cleansing: Rinsed/Irrigated with Saline Topical Anesthetic Applied: Other: lidocaine 4%, Treatment Notes Wound #2 (Left, Medial Lower Leg) 1. Cleansed with: Clean wound with Normal Saline 2. Anesthetic Topical Lidocaine 4% cream to wound bed prior to debridement 4. Dressing Applied: Aquacel Ag Other dressing (specify in notes) 5. Secondary Dressing Applied ABD Pad 7. Secured with Tape Notes xtrasorb, kerlix and coban wrap from toes to 3cm below the knee Electronic Signature(s) Signed: 05/23/2016 3:52:38 PM By: Montey Hora Entered By: Montey Hora on 05/23/2016 14:06:57 [name] ([id number]) [place], [name] ([id number]) -------------------------------------------------------------------------------- [place] Details Patient Name: [name] Date  of Service: 05/23/2016 1:30 PM Medical Record Number: [id number] Patient Account Number: [id number] Date of Birth/Sex: 1950/08/03 (66 y.o. Male) Treating RN: Montey Hora Primary Care Physician: PATIENT, NO Other Clinician: Referring Physician: Treating Physician/Extender: Frann Rider in Treatment: 0 Vital Signs Time Taken: 13:36 Temperature (F): 98.1 Height (in): 73 Pulse (bpm): 61 Source: Measured Respiratory Rate (breaths/min): 16 Weight (lbs): 326 Blood Pressure (mmHg): 126/49 Source: Measured Reference Range: 80 - 120 mg / dl Body Mass Index (BMI): 43 Electronic Signature(s) Signed: 05/23/2016 3:52:38 PM By: Montey Hora Entered By: Montey Hora on 05/23/2016 13:39:10

## 2016-05-23 NOTE — Discharge Instructions (Signed)
Continue current oral antibiotic\ Follow up at wound care clinic ([facility]); appointment today at 1:30

## 2016-05-23 NOTE — Progress Notes (Addendum)
[name], [name] ([id number]) Visit Report for 05/23/2016 Chief Complaint Document Details Patient Name: [name] Date of Service: 05/23/2016 1:30 PM Medical Record Number: [id number] Patient Account Number: [id number] Date of Birth/Sex: [birth year] (66 y.o. Male) Treating RN: Primary Care Physician: PATIENT, NO Other Clinician: Referring Physician: Treating Physician/Extender: Frann Rider in Treatment: 0 Information Obtained from: Patient Chief Complaint Patient seen for complaints of Non-Healing Wound to both lower extremities with ulceration and swelling which has been going on for several months. 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 Male) Treating RN: Primary Care Physician: PATIENT, NO Other Clinician: Referring Physician: Treating Physician/Extender: Frann Rider in Treatment: 0 History of Present Illness Location: both lower extremities right worse than left Quality: Patient reports experiencing a dull pain to affected area(s). Severity: Patient states wound are getting worse. Duration: Patient has had the swelling for > 3 months prior to seeking treatment at the wound center. the blisters only arisen for the last week Timing: Pain in wound is Intermittent (comes and goes Context: The wound appeared gradually over time Modifying Factors: Other treatment(s) tried include:he has been put on oral doxycycline and has had Rocephin at the ER for 2 days Associated Signs and Symptoms: Patient reports having increase swelling. HPI Description:  66 year old male who presented to the ER with bilateral lower extremity blisters which had started last week. he has a past medical history of leukemia, diabetes mellitus, hypertension, edema of both lower extremities, his recurrent skin infections, peripheral vascular disease, coronary artery disease, congestive heart failure and peripheral neuropathy. in the ER he was given Rocephin and put on Silvadene cream. he was put on oral doxycycline and was asked to follow-up with the [facility]. His last hemoglobin A1c was 6.6 in December and he checks his blood sugar once a week. He does not have any physicians outside the [place] system. He does not recall any vascular duplex studies done either for arterial or venous disease but was told to wear compression stockings which he does not use Electronic Signature(s) Signed: 05/23/2016 2:42:31 PM By: Christin Fudge MD, FACS Previous Signature: 05/23/2016 1:52:44 PM Version By: Christin Fudge MD, FACS Entered By: Christin Fudge on 05/23/2016 14:42:31 [name] ([id number]) -------------------------------------------------------------------------------- Physical Exam Details Patient Name: [name] Date of Service: 05/23/2016 1:30 PM Medical Record Number: [id number] Patient Account Number: [id number] Date of Birth/Sex: 1951/01/20 (66 y.o. Male) Treating RN: Primary Care Physician: PATIENT, NO Other Clinician: Referring Physician: Treating Physician/Extender: Frann Rider in Treatment: 0 Constitutional . Pulse regular. Respirations normal and unlabored. Afebrile. . Eyes Nonicteric. Reactive to light. Ears, Nose, Mouth, and Throat Lips, teeth, and gums WNL.Marland Kitchen Moist mucosa without lesions. Neck supple and nontender. No palpable supraclavicular or cervical adenopathy. Normal sized without goiter. Respiratory WNL. No retractions.. Breath sounds WNL, No rubs, rales, rhonchi, or wheeze.. Cardiovascular Heart rhythm and rate regular, no murmur or  gallop.. Pedal Pulses WNL. his ABI on the left was 1.37 and basically noncompressible. The right could not be measured. he has stage I lymphedema of both lower extremities. Chest Breasts symmetical and no nipple discharge.. Breast tissue WNL, no masses, lumps, or tenderness.. Gastrointestinal (GI) Abdomen without masses or tenderness.. No liver or spleen enlargement or tenderness.Marland Kitchen  Lymphatic No adneopathy. No adenopathy. No adenopathy. Musculoskeletal Adexa without tenderness or enlargement.. Digits and nails w/o clubbing, cyanosis, infection, petechiae, ischemia, or inflammatory conditions.. Integumentary (Hair, Skin) No suspicious lesions. No crepitus or fluctuance. No peri-wound warmth or erythema. No masses.Marland Kitchen Psychiatric Judgement and insight Intact.. No evidence of depression, anxiety, or agitation.. Notes aside bilateral lymphedema the patient has a large blister of the right lower extremity which has now discharge the fluid and the skin is adherent to the underlying breakdown. The right leg is worse than the left leg and no sharp debridement was required today. Electronic Signature(s) Signed: 05/23/2016 2:43:41 PM By: Christin Fudge MD, FACS [name], [name] ([id number]) Entered By: Christin Fudge on 05/23/2016 14:43:41 [name] ([id number]) -------------------------------------------------------------------------------- Physician Orders Details Patient Name: [name] Date of Service: 05/23/2016 1:30 PM Medical Record Number: [id number] Patient Account Number: [id number] Date of Birth/Sex: 1950-12-26 (66 y.o. Male) Treating RN: Montey Hora Primary Care Provider: PATIENT, NO Other Clinician: Referring Provider: Treating Provider/Extender: Frann Rider in Treatment: 0 Verbal / Phone Orders: Yes Clinician: Montey Hora Read Back and Verified: Yes Diagnosis Coding Wound Cleansing Wound #1 Right,Medial Lower Leg o Clean wound with Normal Saline. Wound #2  Left,Medial Lower Leg o Clean wound with Normal Saline. Anesthetic Wound #1 Right,Medial Lower Leg o Topical Lidocaine 4% cream applied to wound bed prior to debridement Wound #2 Left,Medial Lower Leg o Topical Lidocaine 4% cream applied to wound bed prior to debridement Primary Wound Dressing Wound #1 Right,Medial Lower Leg o Aquacel Ag Wound #2 Left,Medial Lower Leg o Aquacel Ag Secondary Dressing Wound #1 Right,Medial Lower Leg o ABD pad o XtraSorb Wound #2 Left,Medial Lower Leg o ABD pad o XtraSorb Dressing Change Frequency Wound #1 Right,Medial Lower Leg o Change Dressing Monday, Wednesday, Friday Wound #2 Left,Medial Lower Leg o Change Dressing Monday, Wednesday, Friday [place] ([id number]) Follow-up Appointments Wound #1 Right,Medial Lower Leg o Return Appointment in 1 week. Wound #2 Left,Medial Lower Leg o Return Appointment in 1 week. Edema Control Wound #1 Right,Medial Lower Leg o Kerlix and Coban - Bilateral - may anchor with unna if needed o Elevate legs to the level of the heart and pump ankles as often as possible Wound #2 Left,Medial Lower Leg o Kerlix and Coban - Bilateral - may anchor with unna if needed o Elevate legs to the level of the heart and pump ankles as often as possible Additional Orders / Instructions Wound #1 Right,Medial Lower Leg o Increase protein intake. o Other: - Please added vitamin a, vitamin c and zinc supplements to your diet Wound #2 Left,Medial Lower Leg o Increase protein intake. o Other: - Please added vitamin a, vitamin c and zinc supplements to your diet Home Health Wound #1 [facility] [place] Nurse may visit PRN to address patientos wound care needs. o FACE TO FACE ENCOUNTER: MEDICARE and MEDICAID PATIENTS: I certify that this patient is under my care and that I had a face-to-face encounter that meets the  physician face-to-face encounter requirements with this patient on this date. The encounter with the patient was in whole or in part for the following MEDICAL CONDITION: (primary reason for [place]) MEDICAL NECESSITY: I certify, that based on my findings, NURSING services are a medically necessary home health service. HOME BOUND STATUS: I certify that my clinical findings support that this patient is homebound (i.e., Due to illness or injury, pt requires aid of supportive devices such as crutches, cane,  wheelchairs, walkers, the use of special transportation or the assistance of another person to leave their place of residence. There is a normal inability to leave the home and doing so requires considerable and taxing effort. Other absences are for medical reasons / religious services and are infrequent or of short duration when for other reasons). o If current dressing causes regression in wound condition, may D/C ordered dressing product/s and apply Normal Saline Moist Dressing daily until next [place] / Other MD appointment. [place] of regression in wound condition at [phone]. o Please direct any NON-WOUND related issues/requests for orders to patient's Primary Care Physician [place] ([id number]) Wound #2 [place] for [place] Nurse may visit PRN to address patientos wound care needs. o FACE TO FACE ENCOUNTER: MEDICARE and MEDICAID PATIENTS: I certify that this patient is under my care and that I had a face-to-face encounter that meets the physician face-to-face encounter requirements with this patient on this date. The encounter with the patient was in whole or in part for the following MEDICAL CONDITION: (primary reason for [place]) MEDICAL NECESSITY: I certify, that based on my findings, NURSING services are a medically necessary home health service. HOME BOUND  STATUS: I certify that my clinical findings support that this patient is homebound (i.e., Due to illness or injury, pt requires aid of supportive devices such as crutches, cane, wheelchairs, walkers, the use of special transportation or the assistance of another person to leave their place of residence. There is a normal inability to leave the home and doing so requires considerable and taxing effort. Other absences are for medical reasons / religious services and are infrequent or of short duration when for other reasons). o If current dressing causes regression in wound condition, may D/C ordered dressing product/s and apply Normal Saline Moist Dressing daily until next [place] / Other MD appointment. [place] of regression in wound condition at [phone]. o Please direct any NON-WOUND related issues/requests for orders to patient's Primary Care Physician Services and Therapies o Arterial Studies- Bilateral o Venous Studies -Bilateral Electronic Signature(s) Signed: 05/30/2016 4:30:11 PM By: Christin Fudge MD, FACS Signed: 08/18/2016 4:57:27 PM By: Montey Hora Previous Signature: 05/23/2016 3:40:08 PM Version By: Christin Fudge MD, FACS Previous Signature: 05/23/2016 3:52:38 PM Version By: Montey Hora Entered By: Montey Hora on 05/30/2016 11:11:44 [name] ([id number]) -------------------------------------------------------------------------------- Problem List Details Patient Name: [name] Date of Service: 05/23/2016 1:30 PM Medical Record Number: [id number] Patient Account Number: [id number] Date of Birth/Sex: [birth year] (66 y.o. Male) Treating RN: Primary Care Physician: PATIENT, NO Other Clinician: Referring Physician: Treating Physician/Extender: Frann Rider in Treatment: 0 Active Problems ICD-10 Encounter Code Description Active Date Diagnosis E11.622 Type 2 diabetes mellitus with other skin ulcer 05/23/2016  Yes I89.0 Lymphedema, not elsewhere classified 05/23/2016 Yes L97.211 Non-pressure chronic ulcer of right calf limited to 05/23/2016 Yes breakdown of skin L97.221 Non-pressure chronic ulcer of left calf limited to 05/23/2016 Yes breakdown of skin E66.01 Morbid (severe) obesity due to excess calories 05/23/2016 Yes Inactive Problems Resolved Problems Electronic Signature(s) Signed: 05/23/2016 2:39:01 PM By: Christin Fudge MD, FACS Entered By: Christin Fudge on 05/23/2016 14:39:00 [name] ([id number]) -------------------------------------------------------------------------------- Progress Note Details Patient Name: [name] Date of Service: 05/23/2016 1:30 PM Medical Record Number: [id number] Patient Account Number: [id number] Date of Birth/Sex: 1950/08/05 (66 y.o. Male) Treating RN: Primary Care Provider: PATIENT, NO Other Clinician: Referring Provider: Treating Provider/Extender: Con Memos,  Landon Truax Weeks in Treatment: 0 Subjective Chief Complaint Information obtained from Patient Patient seen for complaints of Non-Healing Wound to both lower extremities with ulceration and swelling which has been going on for several months. He broken into large blisters about a week ago History of Present Illness (HPI) The following HPI elements were documented for the patient's wound: Location: both lower extremities right worse than left Quality: Patient reports experiencing a dull pain to affected area(s). Severity: Patient states wound are getting worse. Duration: Patient has had the swelling for > 3 months prior to seeking treatment at the wound center. the blisters only arisen for the last week Timing: Pain in wound is Intermittent (comes and goes Context: The wound appeared gradually over time Modifying Factors: Other treatment(s) tried include:he has been put on oral doxycycline and has had Rocephin at the ER for 2 days Associated Signs and Symptoms: Patient reports having increase  swelling. 66 year old male who presented to the ER with bilateral lower extremity blisters which had started last week. he has a past medical history of leukemia, diabetes mellitus, hypertension, edema of both lower extremities, his recurrent skin infections, peripheral vascular disease, coronary artery disease, congestive heart failure and peripheral neuropathy. in the ER he was given Rocephin and put on Silvadene cream. he was put on oral doxycycline and was asked to follow-up with the [facility]. His last hemoglobin A1c was 6.6 in December and he checks his blood sugar once a week. He does not have any physicians outside the [place] system. He does not recall any vascular duplex studies done either for arterial or venous disease but was told to wear compression stockings which he does not use Wound History Patient presents with 2 open wounds that have been present for approximately 4-5 days. Patient has been treating wounds in the following manner: cream and wrapping from urgent care in [place]. Laboratory tests have been performed in the last month. Patient reportedly has not tested positive for an antibiotic resistant organism. Patient reportedly has not tested positive for osteomyelitis. Patient reportedly has not had testing performed to evaluate circulation in the legs. Patient experiences the following problems associated with their wounds: swelling. Patient History [name], [name] ([id number]) Information obtained from Patient. Allergies No Known Drug Allergies Social History Former smoker - quit 5 years ago, Marital Status - Divorced, Alcohol Use - Never, Drug Use - No History, Caffeine Use - Daily. Medical History Cardiovascular Patient has history of Hypertension Endocrine Patient has history of Type II Diabetes Neurologic Patient has history of Neuropathy Oncologic Patient has history of Received Chemotherapy Patient is treated with Oral Agents. 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 Eyes Complains or has symptoms of Glasses / Contacts. Ear/Nose/Mouth/Throat The patient has no complaints or symptoms. Hematologic/Lymphatic The patient has no complaints or symptoms. Respiratory The patient has no complaints or symptoms. Cardiovascular Complains or has symptoms of LE edema. Gastrointestinal The patient has no complaints or symptoms. Genitourinary The patient has no complaints or symptoms. Immunological The patient has no complaints or symptoms. Integumentary (Skin) The patient has no complaints or symptoms. Musculoskeletal The patient has no complaints or symptoms. Neurologic The patient has no complaints or symptoms. [name], [name] ([id number]) Psychiatric The patient has no complaints or symptoms. Medications lisinopril 20 mg-hydrochlorothiazide 12.5 mg tablet oral tablet oral metformin 1,000 mg tablet oral tablet oral pioglitazone 15 mg tablet oral tablet oral  pravastatin 80 mg tablet oral tablet oral hydralazine 10 mg tablet oral tablet oral bisoprolol 10 mg-hydrochlorothiazide 6.25 mg tablet oral tablet oral furosemide 20 mg tablet oral tablet oral Vibramycin 100 mg capsule oral capsule oral Objective Constitutional Pulse regular. Respirations normal and unlabored. Afebrile. Vitals Time Taken: 1:36 PM, Height: 73 in, Source: Measured, Weight: 326 lbs, Source: Measured, BMI: 43, Temperature: 98.1 F, Pulse: 61 bpm, Respiratory Rate: 16 breaths/min, Blood Pressure: 126/49 mmHg. Eyes Nonicteric. Reactive to light. Ears, Nose, Mouth, and Throat Lips, teeth, and gums WNL.Marland Kitchen Moist mucosa without lesions. Neck supple and nontender. No palpable supraclavicular or cervical adenopathy. Normal sized without goiter. Respiratory WNL. No retractions.. Breath sounds WNL, No rubs, rales, rhonchi, or  wheeze.. Cardiovascular Heart rhythm and rate regular, no murmur or gallop.. Pedal Pulses WNL. his ABI on the left was 1.37 and basically noncompressible. The right could not be measured. he has stage I lymphedema of both lower extremities. Chest Breasts symmetical and no nipple discharge.. Breast tissue WNL, no masses, lumps, or tenderness.[name], [name] ([id number]) Gastrointestinal (GI) Abdomen without masses or tenderness.. No liver or spleen enlargement or tenderness.. Lymphatic No adneopathy. No adenopathy. No adenopathy. Musculoskeletal Adexa without tenderness or enlargement.. Digits and nails w/o clubbing, cyanosis, infection, petechiae, ischemia, or inflammatory conditions.Marland Kitchen Psychiatric Judgement and insight Intact.. No evidence of depression, anxiety, or agitation.. General Notes: aside bilateral lymphedema the patient has a large blister of the right lower extremity which has now discharge the fluid and the skin is adherent to the underlying breakdown. The right leg is worse than the left leg and no sharp debridement was required today. Integumentary (Hair, Skin) No suspicious lesions. No crepitus or fluctuance. No peri-wound warmth or erythema. No masses.. Wound #1 status is Open. Original cause of wound was Blister. The wound is located on the Right,Medial Lower Leg. The wound measures 12.3cm length x 15.5cm width x 0.1cm depth; 149.736cm^2 area and 14.974cm^3 volume. The wound is limited to skin breakdown. There is no tunneling or undermining noted. There is a large amount of serous drainage noted. The wound margin is flat and intact. There is large (67- 100%) red, pink granulation within the wound bed. There is a small (1-33%) amount of necrotic tissue within the wound bed including Adherent Slough. The periwound skin appearance exhibited: Moist, Erythema. The periwound skin appearance did not exhibit: Callus, Crepitus, Excoriation, Fluctuance, Friable,  Induration, Localized Edema, Rash, Scarring, Dry/Scaly, Maceration, Atrophie Blanche, Cyanosis, Ecchymosis, Hemosiderin Staining, Mottled, Pallor, Rubor. The surrounding wound skin color is noted with erythema which is circumferential. The periwound has tenderness on palpation. Wound #2 status is Open. Original cause of wound was Blister. The wound is located on the Left,Medial Lower Leg. The wound measures 2.3cm length x 2.2cm width x 0.1cm depth; 3.974cm^2 area and 0.397cm^3 volume. The wound is limited to skin breakdown. There is no tunneling or undermining noted. There is a large amount of serous drainage noted. The wound margin is flat and intact. There is large (67- 100%) pink granulation within the wound bed. There is no necrotic tissue within the wound bed. The periwound skin appearance exhibited: Moist. The periwound skin appearance did not exhibit: Callus, Crepitus, Excoriation, Fluctuance, Friable, Induration, Localized Edema, Rash, Scarring, Dry/Scaly, Maceration, Atrophie Blanche, Cyanosis, Ecchymosis, Hemosiderin Staining, Mottled, Pallor, Rubor, Erythema. The periwound has tenderness on palpation. Assessment Active Problems ICD-10 [name], [name] ([id number]) E11.622 - Type 2 diabetes mellitus with other skin ulcer I89.0 - Lymphedema, not elsewhere classified L97.211 - Non-pressure chronic ulcer of right  calf limited to breakdown of skin L97.221 - Non-pressure chronic ulcer of left calf limited to breakdown of skin E66.01 - Morbid (severe) obesity due to excess calories we have not been able to get any of the patient's notes as he has been worked up at the [place], [place] system. I have requested notes from his PCP and have recommended: 1. Silver alginate to both lower extremities and a Kerlix and Coban wrap to be changed twice a week 2. Elevation and exercise 3. Adequate protein, vitamin A, vitamin C and zinc 4. arterial duplex study and venous reflux study for both lower  extremities 5. Regular basis to the wound center Plan Wound Cleansing: Wound #1 Right,Medial Lower Leg: Clean wound with Normal Saline. Wound #2 Left,Medial Lower Leg: Clean wound with Normal Saline. Anesthetic: Wound #1 Right,Medial Lower Leg: Topical Lidocaine 4% cream applied to wound bed prior to debridement Wound #2 Left,Medial Lower Leg: Topical Lidocaine 4% cream applied to wound bed prior to debridement Primary Wound Dressing: Wound #1 Right,Medial Lower Leg: Aquacel Ag Wound #2 Left,Medial Lower Leg: Aquacel Ag Secondary Dressing: Wound #1 Right,Medial Lower Leg: ABD pad XtraSorb Wound #2 Left,Medial Lower Leg: ABD pad XtraSorb Dressing Change Frequency: Wound #1 Right,Medial Lower Leg: Change Dressing Monday, Wednesday, Friday Wound #2 Left,Medial Lower Leg: Change Dressing Monday, Wednesday, Friday Follow-up Appointments: [name], [name] ([id number]) Wound #1 Right,Medial Lower Leg: Return Appointment in 1 week. Wound #2 Left,Medial Lower Leg: Return Appointment in 1 week. Edema Control: Wound #1 Right,Medial Lower Leg: Kerlix and Coban - Bilateral - may anchor with unna if needed Elevate legs to the level of the heart and pump ankles as often as possible Wound #2 Left,Medial Lower Leg: Kerlix and Coban - Bilateral - may anchor with unna if needed Elevate legs to the level of the heart and pump ankles as often as possible Additional Orders / Instructions: Wound #1 Right,Medial Lower Leg: Increase protein intake. Other: - Please added vitamin a, vitamin c and zinc supplements to your diet Wound #2 Left,Medial Lower Leg: Increase protein intake. Other: - Please added vitamin a, vitamin c and zinc supplements to your diet Home Health: Wound #1 Right,Medial Lower Leg: [place] for [place] Nurse may visit PRN to address patient s wound care needs. FACE TO FACE ENCOUNTER: MEDICARE and MEDICAID PATIENTS: I certify that this  patient is under my care and that I had a face-to-face encounter that meets the physician face-to-face encounter requirements with this patient on this date. The encounter with the patient was in whole or in part for the following MEDICAL CONDITION: (primary reason for [place]) MEDICAL NECESSITY: I certify, that based on my findings, NURSING services are a medically necessary home health service. HOME BOUND STATUS: I certify that my clinical findings support that this patient is homebound (i.e., Due to illness or injury, pt requires aid of supportive devices such as crutches, cane, wheelchairs, walkers, the use of special transportation or the assistance of another person to leave their place of residence. There is a normal inability to leave the home and doing so requires considerable and taxing effort. Other absences are for medical reasons / religious services and are infrequent or of short duration when for other reasons). If current dressing causes regression in wound condition, may D/C ordered dressing product/s and apply Normal Saline Moist Dressing daily until next [place] / Other MD appointment. [place] of regression in wound condition at [phone]. Please direct any NON-WOUND  related issues/requests for orders to patient's Primary Care Physician Wound #2 Left,Medial Lower Leg: [place] for [place] Nurse may visit PRN to address patient s wound care needs. FACE TO FACE ENCOUNTER: MEDICARE and MEDICAID PATIENTS: I certify that this patient is under my care and that I had a face-to-face encounter that meets the physician face-to-face encounter requirements with this patient on this date. The encounter with the patient was in whole or in part for the following MEDICAL CONDITION: (primary reason for [place]) MEDICAL NECESSITY: I certify, that based on my findings, NURSING services are a medically necessary  home health service. HOME BOUND STATUS: I certify that my clinical findings support that this patient is homebound (i.e., Due to illness or injury, pt requires aid of supportive devices such as crutches, cane, wheelchairs, walkers, the use of special transportation or the assistance of another person to leave their place of residence. There is a normal inability to leave the home and doing so requires considerable and taxing effort. Other absences are for medical reasons / religious services and are infrequent or of short duration when for other reasons). If current dressing causes regression in wound condition, may D/C ordered dressing product/s and apply Normal Saline Moist Dressing daily until next [place] / Other MD appointment. Notify Wound [name], [name] ([id number]) [name] of regression in wound condition at [phone]. Please direct any NON-WOUND related issues/requests for orders to patient's Primary Care Physician Services and Therapies ordered were: Arterial Studies- Bilateral, Venous Studies -Bilateral we have not been able to get any of the patient's notes as he has been worked up at the [place] system. I have requested notes from his PCP and have recommended: 1. Silver alginate to both lower extremities and a Kerlix and Coban wrap to be changed twice a week 2. Elevation and exercise 3. Adequate protein, vitamin A, vitamin C and zinc 4. arterial duplex study and venous reflux study for both lower extremities 5. Regular basis to the wound center Electronic Signature(s) Signed: 05/30/2016 4:26:52 PM By: Christin Fudge MD, FACS Previous Signature: 05/23/2016 2:45:19 PM Version By: Christin Fudge MD, FACS Entered By: Christin Fudge on 05/30/2016 16:26:52 [name] ([id number]) -------------------------------------------------------------------------------- ROS/PFSH Details Patient Name: [name] Date of Service: 05/23/2016 1:30 PM Medical Record Number:  [id number] Patient Account Number: [id number] Date of Birth/Sex: 1951/04/15 (66 y.o. Male) Treating RN: Montey Hora Primary Care Physician: PATIENT, NO Other Clinician: Referring Physician: Treating Physician/Extender: Frann Rider in Treatment: 0 Information Obtained From Patient Wound History Do you currently have one or more open woundso Yes How many open wounds do you currently haveo 2 Approximately how long have you had your woundso 4-5 days How have you been treating your wound(s) until nowo cream and wrapping from urgent care in [place] Has your wound(s) ever healed and then re-openedo No Have you had any lab work done in the past montho Yes Who ordered the lab work [place] Have you tested positive for an antibiotic resistant organism No (MRSA, VRE)o Have you tested positive for osteomyelitis (bone infection)o No Have you had any tests for circulation on your legso No Have you had other problems associated with your woundso Swelling Eyes Complaints and Symptoms: Positive for: Glasses / Contacts Cardiovascular Complaints and Symptoms: Positive for: LE edema Medical History: Positive for: Hypertension Constitutional Symptoms (General Health) Complaints and Symptoms: No Complaints or Symptoms Ear/Nose/Mouth/Throat Complaints and Symptoms: No Complaints or Symptoms Hematologic/Lymphatic [name], [name] ([id number]) Complaints and Symptoms:  No Complaints or Symptoms Respiratory Complaints and Symptoms: No Complaints or Symptoms Gastrointestinal Complaints and Symptoms: No Complaints or Symptoms Endocrine Medical History: Positive for: Type II Diabetes Treated with: Oral agents Genitourinary Complaints and Symptoms: No Complaints or Symptoms Immunological Complaints and Symptoms: No Complaints or Symptoms Integumentary (Skin) Complaints and Symptoms: No Complaints or Symptoms Musculoskeletal Complaints and Symptoms: No Complaints or  Symptoms Neurologic Complaints and Symptoms: No Complaints or Symptoms Medical History: Positive for: Neuropathy Oncologic Medical History: Positive for: Received Chemotherapy Past Medical History NotesMarland Kitchen [name], [name] ([id number]) 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 Psychiatric Complaints and Symptoms: No Complaints or Symptoms Immunizations Pneumococcal Vaccine: Received Pneumococcal Vaccination: Yes Immunization Notes: up to date Family and Social History Former smoker - quit 5 years ago; Marital Status - Divorced; Alcohol Use: Never; Drug Use: No History; Caffeine Use: Daily; Financial Concerns: No; Food, Clothing or Shelter Needs: No; Support System Lacking: No; Transportation Concerns: No; Advanced Directives: Yes (Not Provided); Patient does not want information on Advanced Directives; Living Will: Yes (Not Provided); Medical Power of Attorney: Yes - sister - Ferd Hibbs (Not Provided) Physician Affirmation I have reviewed and agree with the above information. Electronic Signature(s) Signed: 05/23/2016 3:40:08 PM By: Christin Fudge MD, FACS Signed: 05/23/2016 3:52:38 PM By: Montey Hora Entered By: Christin Fudge on 05/23/2016 14:22:53 [name] ([id number]) -------------------------------------------------------------------------------- [place] Details Patient Name: [name] Date of Service: 05/23/2016 Medical Record Number: [id number] Patient Account Number: [id number] Date of Birth/Sex: [birth year] (66 y.o. Male) Treating RN: Primary Care Physician: PATIENT, NO Other Clinician: Referring Physician: Treating Physician/Extender: Frann Rider in Treatment: 0 Diagnosis Coding ICD-10 Codes Code Description E11.622 Type 2 diabetes mellitus with other skin ulcer I89.0 Lymphedema, not elsewhere classified L97.211 Non-pressure chronic ulcer of right calf limited to breakdown of skin L97.221 Non-pressure chronic ulcer of left calf limited to breakdown of  skin E66.01 Morbid (severe) obesity due to excess calories Facility Procedures CPT4 Code: [id number] Description: [id number] - WOUND CARE VISIT-LEV 5 EST PT Modifier: Quantity: 1 Physician Procedures CPT4 Code Description: [id number] - WC PHYS LEVEL 4 - NEW PT ICD-10 Description Diagnosis E11.622 Type 2 diabetes mellitus with other skin ulcer I89.0 Lymphedema, not elsewhere classified L97.211 Non-pressure chronic ulcer of right calf limited to  L97.221 Non-pressure chronic ulcer of left calf limited to b Modifier: breakdown of sk reakdown of ski Quantity: 1 in Administrator) Signed: 05/23/2016 2:45:45 PM By: Christin Fudge MD, FACS Entered By: Christin Fudge on 05/23/2016 14:45:44

## 2016-05-23 NOTE — Progress Notes (Signed)
BAILEN, MCCONVIL[place] ([id number]) Visit Report for 05/23/2016 Abuse/Suicide Risk Screen Details Patient Name: [name], [name] Date of Service: 05/23/2016 1:30 PM Medical Record Number: [id number] Patient Account Number: [id number] Date of Birth/Sex: [birth year] (66 y.o. Male) Treating RN: Montey Hora Primary Care Physician: PATIENT, NO Other Clinician: Referring Physician: Treating Physician/Extender: Frann Rider in Treatment: 0 Abuse/Suicide Risk Screen Items Answer ABUSE/SUICIDE RISK SCREEN: Has anyone close to you tried to hurt or harm you recentlyo No Do you feel uncomfortable with anyone in your familyo No Has anyone forced you do things that you didnot want to doo No Do you have any thoughts of harming yourselfo No Patient displays signs or symptoms of abuse and/or neglect. No Electronic Signature(s) Signed: 05/23/2016 3:52:38 PM By: Montey Hora Entered By: Montey Hora on 05/23/2016 13:40:06 [name] ([id number]) -------------------------------------------------------------------------------- Activities of Daily Living Details Patient Name: [name] Date of Service: 05/23/2016 1:30 PM Medical Record Number: [id number] Patient Account Number: [id number] Date of Birth/Sex: [birth year] (66 y.o. Male) Treating RN: Montey Hora Primary Care Physician: PATIENT, NO Other Clinician: Referring Physician: Treating Physician/Extender: Frann Rider in Treatment: 0 Activities of Daily Living Items Answer Activities of Daily Living (Please select one for each item) Drive Automobile Completely Able Take Medications Completely Able Use Telephone Completely [place] for Appearance Completely Able Use Toilet Completely Able Bath / Shower Completely Able Dress Self Completely Able Feed Self Completely Able Walk Completely Able Get In / Out Bed Completely [place] for Self  Completely Able Electronic Signature(s) Signed: 05/23/2016 3:52:38 PM By: Montey Hora Entered By: Montey Hora on 05/23/2016 13:40:26 [name] ([id number]) -------------------------------------------------------------------------------- Education Assessment Details Patient Name: [name] Date of Service: 05/23/2016 1:30 PM Medical Record Number: [id number] Patient Account Number: [id number] Date of Birth/Sex: [birth year] (66 y.o. Male) Treating RN: Montey Hora Primary Care Physician: PATIENT, NO Other Clinician: Referring Physician: Treating Physician/Extender: Frann Rider in Treatment: 0 Primary Learner Assessed: Patient Learning Preferences/Education Level/Primary Language Learning Preference: Explanation, Demonstration Highest Education Level: High School Preferred Language: English Cognitive Barrier Assessment/Beliefs Language Barrier: No Translator Needed: No Memory Deficit: No Emotional Barrier: No Cultural/Religious Beliefs Affecting Medical No Care: Physical Barrier Assessment Impaired Vision: No Impaired Hearing: No Decreased Hand dexterity: No Knowledge/Comprehension Assessment Knowledge Level: Medium Comprehension Level: Medium Ability to understand written Medium instructions: Ability to understand verbal Medium instructions: Motivation Assessment Anxiety Level: Calm Cooperation: Cooperative Education Importance: Acknowledges Need Interest in Health Problems: Asks Questions Perception: Coherent Willingness to Engage in Self- Medium Management Activities: Readiness to Engage in Self- Medium Management Activities: Electronic Signature(s) [place], [place] ([id number]) Signed: 05/23/2016 3:52:38 PM By: Montey Hora Entered By: Montey Hora on 05/23/2016 13:40:50 [name] ([id number]) -------------------------------------------------------------------------------- Fall Risk Assessment Details Patient Name: [name] Date  of Service: 05/23/2016 1:30 PM Medical Record Number: [id number] Patient Account Number: [id number] Date of Birth/Sex: 1951/01/27 (66 y.o. Male) Treating RN: Montey Hora Primary Care Physician: PATIENT, NO Other Clinician: Referring Physician: Treating Physician/Extender: Frann Rider in Treatment: 0 Fall Risk Assessment Items Have you had 2 or more falls in the last 12 monthso 0 No Have you had any fall that resulted in injury in the last 12 monthso 0 No FALL RISK ASSESSMENT: History of falling - immediate or within 3 months 0 No Secondary diagnosis 0 No Ambulatory aid None/bed rest/wheelchair/nurse 0 Yes Crutches/cane/walker 0 No Furniture 0 No IV Access/Saline Lock 0 No Gait/Training Normal/bed rest/immobile 0 No Weak 10 Yes Impaired 0 No  Mental Status Oriented to own ability 0 Yes Electronic Signature(s) Signed: 05/23/2016 3:52:38 PM By: Montey Hora Entered By: Montey Hora on 05/23/2016 13:41:00 [name] ([id number]) -------------------------------------------------------------------------------- Foot Assessment Details Patient Name: [name] Date of Service: 05/23/2016 1:30 PM Medical Record Number: [id number] Patient Account Number: [id number] Date of Birth/Sex: 1950/10/24 (66 y.o. Male) Treating RN: Montey Hora Primary Care Physician: PATIENT, NO Other Clinician: Referring Physician: Treating Physician/Extender: Frann Rider in Treatment: 0 Foot Assessment Items Site Locations + = Sensation present, - = Sensation absent, C = Callus, U = Ulcer R = Redness, W = Warmth, M = Maceration, PU = Pre-ulcerative lesion F = Fissure, S = Swelling, D = Dryness Assessment Right: Left: Other Deformity: No No Prior Foot Ulcer: No No Prior Amputation: No No Charcot Joint: No No Ambulatory Status: Ambulatory Without Help Gait: Steady Electronic Signature(s) Signed: 05/23/2016 3:52:38 PM By: Montey Hora Entered By: Montey Hora on  05/23/2016 14:07:41 [name] ([id number]) -------------------------------------------------------------------------------- Nutrition Risk Assessment Details Patient Name: [name] Date of Service: 05/23/2016 1:30 PM Medical Record Number: [id number] Patient Account Number: [id number] Date of Birth/Sex: 04-01-1951 (66 y.o. Male) Treating RN: Montey Hora Primary Care Physician: PATIENT, NO Other Clinician: Referring Physician: Treating Physician/Extender: Frann Rider in Treatment: 0 Height (in): 73 Weight (lbs): 326 Body Mass Index (BMI): 43 Nutrition Risk Assessment Items NUTRITION RISK SCREEN: I have an illness or condition that made me change the kind and/or 0 No amount of food I eat I eat fewer than two meals per day 0 No I eat few fruits and vegetables, or milk products 0 No I have three or more drinks of beer, liquor or wine almost every day 0 No I have tooth or mouth problems that make it hard for me to eat 0 No I don't always have enough money to buy the food I need 0 No I eat alone most of the time 0 No I take three or more different prescribed or over-the-counter drugs a 1 Yes day Without wanting to, I have lost or gained 10 pounds in the last six 0 No months I am not always physically able to shop, cook and/or feed myself 0 No Nutrition Protocols Good Risk Protocol 0 No interventions needed Moderate Risk Protocol Electronic Signature(s) Signed: 05/23/2016 3:52:38 PM By: Montey Hora Entered By: Montey Hora on 05/23/2016 13:41:10

## 2016-05-25 LAB — AEROBIC CULTURE  (SUPERFICIAL SPECIMEN)

## 2016-05-25 LAB — AEROBIC CULTURE W GRAM STAIN (SUPERFICIAL SPECIMEN)

## 2016-05-26 ENCOUNTER — Telehealth (HOSPITAL_COMMUNITY): Payer: Self-pay | Admitting: Internal Medicine

## 2016-05-26 MED ORDER — CEPHALEXIN 500 MG PO CAPS: 500.0000 mg | ORAL_CAPSULE | Freq: Two times a day (BID) | ORAL | 0 refills | Status: DC

## 2016-05-26 NOTE — Telephone Encounter (Signed)
Clinical staff please see how patient's legs are and let patient know that wound culture was positive for Strep germs.  Rx for cephalexin sent to pharmacy of record, [facility] on [address].  Should continue doxycycline and add cephalexin.  [facility] not [facility], please ask why and encourage followup with wound center either at [facility] or his preferred site.  Can recheck at urgent care as well but anticipate that more specialized therapies than we are able to provide, and prolonged time, will be needed for this condition to be controlled.  LM

## 2016-05-30 ENCOUNTER — Encounter: Payer: PPO | Admitting: Surgery

## 2016-05-30 DIAGNOSIS — E11622 Type 2 diabetes mellitus with other skin ulcer: Secondary | ICD-10-CM | POA: Diagnosis not present

## 2016-05-30 DIAGNOSIS — L97211 Non-pressure chronic ulcer of right calf limited to breakdown of skin: Secondary | ICD-10-CM | POA: Diagnosis not present

## 2016-05-30 DIAGNOSIS — L97221 Non-pressure chronic ulcer of left calf limited to breakdown of skin: Secondary | ICD-10-CM | POA: Diagnosis not present

## 2016-05-31 NOTE — Progress Notes (Signed)
[name], [name] ([id number]) Visit Report for 05/30/2016 Chief Complaint Document Details Patient Name: [name], [name] Date of Service: 05/30/2016 10:15 AM Medical Record Number: [id number] Patient Account Number: [id number] Date of Birth/Sex: [birth year] (66 y.o. Male) Treating RN: Montey Hora Primary Care Provider: PATIENT, NO Other Clinician: Referring Provider: Treating Provider/Extender: Frann Rider in Treatment: 1 Information Obtained from: Patient Chief Complaint Patient seen for complaints of Non-Healing Wound to both lower extremities with ulceration and swelling which has been going on for several months. 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 Male) Treating RN: Montey Hora Primary Care Provider: PATIENT, NO Other Clinician: Referring Provider: Treating Provider/Extender: Frann Rider in Treatment: 1 History of Present Illness Location: both lower extremities right worse than left Quality: Patient reports experiencing a dull pain to affected area(s). Severity: Patient states wound are getting worse. Duration: Patient has had the swelling for > 3 months prior to seeking treatment at the wound center. the blisters only arisen for the last week Timing: Pain in wound is Intermittent (comes and goes Context: The wound appeared gradually over time Modifying Factors: Other treatment(s) tried include:he has been put on oral doxycycline and has had Rocephin at the ER for 2 days Associated Signs and Symptoms: Patient reports having increase  swelling. HPI Description: 66 year old male who presented to the ER with bilateral lower extremity blisters which had started last week. he has a past medical history of leukemia, diabetes mellitus, hypertension, edema of both lower extremities, his recurrent skin infections, peripheral vascular disease, coronary artery disease, congestive heart failure and peripheral neuropathy. in the ER he was given Rocephin and put on Silvadene cream. he was put on oral doxycycline and was asked to follow-up with the [facility]. His last hemoglobin A1c was 6.6 in December and he checks his blood sugar once a week. He does not have any physicians outside the [place] system. He does not recall any vascular duplex studies done either for arterial or venous disease but was told to wear compression stockings which he does not use 05/30/2016 -- we have not yet received any of his notes from the [facility] hospital system and his arterial and venous duplex studies are scheduled here in [place] around mid February. We are unable to have his insurance accepted by home health agencies and hence he is getting dressings only once a week. Electronic Signature(s) Signed: 05/30/2016 10:59:38 AM By: Christin Fudge MD, FACS Entered By: Christin Fudge on 05/30/2016 10:59:38 [name] ([id number]) -------------------------------------------------------------------------------- Physical Exam Details Patient Name: [name] Date of Service: 05/30/2016 10:15 AM Medical Record Number: [id number] Patient Account Number: [id number] Date of Birth/Sex: [birth year] (66 y.o. Male) Treating RN: Montey Hora Primary Care Provider: PATIENT, NO Other Clinician: Referring Provider: Treating Provider/Extender: Frann Rider in Treatment: 1 Constitutional . Pulse regular. Respirations normal and unlabored. Afebrile. . Eyes Nonicteric. Reactive to light. Ears, Nose, Mouth, and Throat Lips, teeth, and gums WNL.Marland Kitchen Moist mucosa without  lesions. Neck supple and nontender. No palpable supraclavicular or cervical adenopathy. Normal sized without goiter. Respiratory WNL. No retractions.. Cardiovascular Pedal Pulses WNL. No clubbing, cyanosis or edema. Lymphatic No adneopathy. No adenopathy. No adenopathy. Musculoskeletal Adexa without tenderness or enlargement.. Digits and nails w/o clubbing, cyanosis,  infection, petechiae, ischemia, or inflammatory conditions.. Integumentary (Hair, Skin) No suspicious lesions. No crepitus or fluctuance. No peri-wound warmth or erythema. No masses.Marland Kitchen Psychiatric Judgement and insight Intact.. No evidence of depression, anxiety, or agitation.. Notes the lymphedema is about the same but the blister on the left lower extremity is looking much better on the right side has got some healthy epithelization. No sharp debridement was done today. Electronic Signature(s) Signed: 05/30/2016 11:00:16 AM By: Christin Fudge MD, FACS Entered By: Christin Fudge on 05/30/2016 11:00:15 [name] ([id number]) -------------------------------------------------------------------------------- Physician Orders Details Patient Name: [name] Date of Service: 05/30/2016 10:15 AM Medical Record Number: [id number] Patient Account Number: [id number] Date of Birth/Sex: [birth year] (66 y.o. Male) Treating RN: Montey Hora Primary Care Provider: PATIENT, NO Other Clinician: Referring Provider: Treating Provider/Extender: Frann Rider in Treatment: 1 Verbal / Phone Orders: Yes Clinician: Montey Hora Read Back and Verified: Yes Diagnosis Coding Wound Cleansing Wound #1 Right,Medial Lower Leg o Clean wound with Normal Saline. Wound #2 Left,Medial Lower Leg o Clean wound with Normal Saline. Anesthetic Wound #1 Right,Medial Lower Leg o Topical Lidocaine 4% cream applied to wound bed prior to debridement Wound #2 Left,Medial Lower Leg o Topical Lidocaine 4% cream applied to wound bed prior  to debridement Primary Wound Dressing Wound #1 Right,Medial Lower Leg o Aquacel Ag Wound #2 Left,Medial Lower Leg o Aquacel Ag Secondary Dressing Wound #1 Right,Medial Lower Leg o ABD pad o XtraSorb Wound #2 Left,Medial Lower Leg o ABD pad o XtraSorb Dressing Change Frequency Wound #1 Right,Medial Lower Leg o Change dressing every week Wound #2 Left,Medial Lower Leg o Change dressing every week [name], [name] ([id number]) Follow-up Appointments Wound #1 Right,Medial Lower Leg o Return Appointment in 1 week. Wound #2 Left,Medial Lower Leg o Return Appointment in 1 week. Edema Control Wound #1 Right,Medial Lower Leg o Kerlix and Coban - Bilateral - may anchor with unna if needed o Elevate legs to the level of the heart and pump ankles as often as possible Wound #2 Left,Medial Lower Leg o Kerlix and Coban - Bilateral - may anchor with unna if needed o Elevate legs to the level of the heart and pump ankles as often as possible Additional Orders / Instructions Wound #1 Right,Medial Lower Leg o Increase protein intake. o Other: - Please added vitamin a, vitamin c and zinc supplements to your diet Wound #2 Left,Medial Lower Leg o Increase protein intake. o Other: - Please added vitamin a, vitamin c and zinc supplements to your diet Electronic Signature(s) Signed: 05/30/2016 4:24:55 PM By: Christin Fudge MD, FACS Signed: 05/30/2016 4:53:16 PM By: Montey Hora Entered By: Montey Hora on 05/30/2016 10:35:55 [name] ([id number]) -------------------------------------------------------------------------------- Problem List Details Patient Name: [name] Date of Service: 05/30/2016 10:15 AM Medical Record Number: [id number] Patient Account Number: [id number] Date of Birth/Sex: Sep 09, 1950 (66 y.o. Male) Treating RN: Montey Hora Primary Care Provider: PATIENT, NO Other Clinician: Referring Provider: Treating Provider/Extender: Frann Rider in Treatment: 1 Active Problems ICD-10 Encounter Code Description Active Date Diagnosis E11.622 Type 2 diabetes mellitus with other skin ulcer 05/23/2016 Yes I89.0 Lymphedema, not elsewhere classified 05/23/2016 Yes L97.211 Non-pressure chronic ulcer of right calf limited to 05/23/2016 Yes breakdown of skin L97.221 Non-pressure chronic ulcer of left calf limited to 05/23/2016 Yes breakdown of skin E66.01 Morbid (severe) obesity due to excess calories 05/23/2016 Yes Inactive Problems Resolved Problems Electronic Signature(s) Signed: 05/30/2016 10:58:41 AM By: Christin Fudge MD, FACS Entered By: Christin Fudge on 05/30/2016 10:58:40 [name] ([id number]) -------------------------------------------------------------------------------- Progress Note  Details Patient Name: [name], [name] Date of Service: 05/30/2016 10:15 AM Medical Record Number: [id number] Patient Account Number: [id number] Date of Birth/Sex: 1950-07-30 (66 y.o. Male) Treating RN: Montey Hora Primary Care Provider: PATIENT, NO Other Clinician: Referring Provider: Treating Provider/Extender: Frann Rider in Treatment: 1 Subjective Chief Complaint Information obtained from Patient Patient seen for complaints of Non-Healing Wound to both lower extremities with ulceration and swelling which has been going on for several months. He broken into large blisters about a week ago History of Present Illness (HPI) The following HPI elements were documented for the patient's wound: Location: both lower extremities right worse than left Quality: Patient reports experiencing a dull pain to affected area(s). Severity: Patient states wound are getting worse. Duration: Patient has had the swelling for > 3 months prior to seeking treatment at the wound center. the blisters only arisen for the last week Timing: Pain in wound is Intermittent (comes and goes Context: The wound appeared gradually over time Modifying  Factors: Other treatment(s) tried include:he has been put on oral doxycycline and has had Rocephin at the ER for 2 days Associated Signs and Symptoms: Patient reports having increase swelling. 66 year old male who presented to the ER with bilateral lower extremity blisters which had started last week. he has a past medical history of leukemia, diabetes mellitus, hypertension, edema of both lower extremities, his recurrent skin infections, peripheral vascular disease, coronary artery disease, congestive heart failure and peripheral neuropathy. in the ER he was given Rocephin and put on Silvadene cream. he was put on oral doxycycline and was asked to follow-up with the [facility]. His last hemoglobin A1c was 6.6 in December and he checks his blood sugar once a week. He does not have any physicians outside the [place] system. He does not recall any vascular duplex studies done either for arterial or venous disease but was told to wear compression stockings which he does not use 05/30/2016 -- we have not yet received any of his notes from the [facility] hospital system and his arterial and venous duplex studies are scheduled here in [place] around mid February. We are unable to have his insurance accepted by home health agencies and hence he is getting dressings only once a week. [place], [name] ([id number]) Constitutional Pulse regular. Respirations normal and unlabored. Afebrile. Vitals Time Taken: 10:11 AM, Height: 73 in, Weight: 326 lbs, BMI: 43, Temperature: 98.4 F, Pulse: 54 bpm, Respiratory Rate: 18 breaths/min, Blood Pressure: 137/61 mmHg. Eyes Nonicteric. Reactive to light. Ears, Nose, Mouth, and Throat Lips, teeth, and gums WNL.Marland Kitchen Moist mucosa without lesions. Neck supple and nontender. No palpable supraclavicular or cervical adenopathy. Normal sized without goiter. Respiratory WNL. No retractions.. Cardiovascular Pedal Pulses WNL. No clubbing, cyanosis or edema. Lymphatic No  adneopathy. No adenopathy. No adenopathy. Musculoskeletal Adexa without tenderness or enlargement.. Digits and nails w/o clubbing, cyanosis, infection, petechiae, ischemia, or inflammatory conditions.Marland Kitchen Psychiatric Judgement and insight Intact.. No evidence of depression, anxiety, or agitation.. General Notes: the lymphedema is about the same but the blister on the left lower extremity is looking much better on the right side has got some healthy epithelization. No sharp debridement was done today. Integumentary (Hair, Skin) No suspicious lesions. No crepitus or fluctuance. No peri-wound warmth or erythema. No masses.. Wound #1 status is Open. Original cause of wound was Blister. The wound is located on the Right,Medial Lower Leg. The wound measures 12cm length x 15.3cm width x 0.1cm depth; 144.199cm^2 area and 14.42cm^3 volume. The wound is limited to skin  breakdown. There is no tunneling or undermining noted. There is a large amount of serous drainage noted. The wound margin is flat and intact. There is large (67- 100%) red, pink granulation within the wound bed. There is a small (1-33%) amount of necrotic tissue within the wound bed including Adherent Slough. The periwound skin appearance did not exhibit: Callus, Crepitus, Excoriation, Induration, Rash, Scarring, Dry/Scaly, Maceration, Atrophie Blanche, Cyanosis, Ecchymosis, Hemosiderin Staining, Mottled, Pallor, Rubor, Erythema. The periwound has tenderness on palpation. Wound #2 status is Open. Original cause of wound was Blister. The wound is located on the Left,Medial Lower Leg. The wound measures 1.1cm length x 0.7cm width x 0.1cm depth; 0.605cm^2 area and Advance, Almon ([id number]) 0.06cm^3 volume. The wound is limited to skin breakdown. There is no tunneling or undermining noted. There is a large amount of serous drainage noted. The wound margin is flat and intact. There is large (67- 100%) pink granulation within the wound bed. There  is no necrotic tissue within the wound bed. The periwound skin appearance did not exhibit: Callus, Crepitus, Excoriation, Induration, Rash, Scarring, Dry/Scaly, Maceration, Atrophie Blanche, Cyanosis, Ecchymosis, Hemosiderin Staining, Mottled, Pallor, Rubor, Erythema. The periwound has tenderness on palpation. Assessment Active Problems ICD-10 E11.622 - Type 2 diabetes mellitus with other skin ulcer I89.0 - Lymphedema, not elsewhere classified L97.211 - Non-pressure chronic ulcer of right calf limited to breakdown of skin L97.221 - Non-pressure chronic ulcer of left calf limited to breakdown of skin E66.01 - Morbid (severe) obesity due to excess calories Plan Wound Cleansing: Wound #1 Right,Medial Lower Leg: Clean wound with Normal Saline. Wound #2 Left,Medial Lower Leg: Clean wound with Normal Saline. Anesthetic: Wound #1 Right,Medial Lower Leg: Topical Lidocaine 4% cream applied to wound bed prior to debridement Wound #2 Left,Medial Lower Leg: Topical Lidocaine 4% cream applied to wound bed prior to debridement Primary Wound Dressing: Wound #1 Right,Medial Lower Leg: Aquacel Ag Wound #2 Left,Medial Lower Leg: Aquacel Ag Secondary Dressing: Wound #1 Right,Medial Lower Leg: ABD pad XtraSorb Wound #2 Left,Medial Lower Leg: ABD pad Alexander Bergeron, [name] ([id number]) Dressing Change Frequency: Wound #1 Right,Medial Lower Leg: Change dressing every week Wound #2 Left,Medial Lower Leg: Change dressing every week Follow-up Appointments: Wound #1 Right,Medial Lower Leg: Return Appointment in 1 week. Wound #2 Left,Medial Lower Leg: Return Appointment in 1 week. Edema Control: Wound #1 Right,Medial Lower Leg: Kerlix and Coban - Bilateral - may anchor with unna if needed Elevate legs to the level of the heart and pump ankles as often as possible Wound #2 Left,Medial Lower Leg: Kerlix and Coban - Bilateral - may anchor with unna if needed Elevate legs to the level of the  heart and pump ankles as often as possible Additional Orders / Instructions: Wound #1 Right,Medial Lower Leg: Increase protein intake. Other: - Please added vitamin a, vitamin c and zinc supplements to your diet Wound #2 Left,Medial Lower Leg: Increase protein intake. Other: - Please added vitamin a, vitamin c and zinc supplements to your diet I have requested notes from his PCP and have recommended: 1. Silver alginate to both lower extremities and a Kerlix and Coban wrap to be changed once a week 2. Elevation and exercise 3. Adequate protein, vitamin A, vitamin C and zinc 4. arterial duplex study and venous reflux study for both lower extremities -- med February at [facility] 5. Regular basis to the wound center Electronic Signature(s) Signed: 05/30/2016 11:01:13 AM By: Christin Fudge MD, FACS Entered By: Christin Fudge on 05/30/2016 11:01:13 [name] ([id number]) -------------------------------------------------------------------------------- SuperBill  Details Patient Name: DEVIAN, TATA Date of Service: 05/30/2016 Medical Record Number: [id number] Patient Account Number: [id number] Date of Birth/Sex: 1950/11/26 (66 y.o. Male) Treating RN: Montey Hora Primary Care Provider: PATIENT, NO Other Clinician: Referring Provider: Treating Provider/Extender: Frann Rider in Treatment: 1 Diagnosis Coding ICD-10 Codes Code Description E11.622 Type 2 diabetes mellitus with other skin ulcer I89.0 Lymphedema, not elsewhere classified L97.211 Non-pressure chronic ulcer of right calf limited to breakdown of skin L97.221 Non-pressure chronic ulcer of left calf limited to breakdown of skin E66.01 Morbid (severe) obesity due to excess calories Facility Procedures CPT4 Code: [id number] Description: 99214 - WOUND CARE VISIT-LEV 4 EST PT Modifier: Quantity: 1 Physician Procedures CPT4 Code Description: [id number] - WC PHYS LEVEL 3 - EST PT ICD-10 Description Diagnosis I89.0  Lymphedema, not elsewhere classified E11.622 Type 2 diabetes mellitus with other skin ulcer L97.221 Non-pressure chronic ulcer of left calf limited to  L97.211 Non-pressure chronic ulcer of right calf limited to Modifier: breakdown of ski breakdown of sk Quantity: 1 n in Electronic Signature(s) Signed: 05/30/2016 11:01:37 AM By: Christin Fudge MD, FACS Entered By: Christin Fudge on 05/30/2016 11:01:36

## 2016-05-31 NOTE — Progress Notes (Signed)
[name] ([id number]) Visit Report for 05/30/2016 Arrival Information Details Patient Name: [name], [name] Date of Service: 05/30/2016 10:15 AM Medical Record Number: [id number] Patient Account Number: [id number] Date of Birth/Sex: 1950-07-29 (66 y.o. Male) Treating RN: Montey Hora Primary Care Saidee Geremia: PATIENT, NO Other Clinician: Referring Macy Lingenfelter: Treating Kyleigha Markert/Extender: Frann Rider in Treatment: 1 Visit Information History Since Last Visit Added or deleted any medications: No Patient Arrived: Ambulatory Any new allergies or adverse reactions: No Arrival Time: 10:09 Had a fall or experienced change in No Accompanied By: self activities of daily living that may affect Transfer Assistance: None risk of falls: Patient Identification Verified: Yes Signs or symptoms of abuse/neglect since last No Secondary Verification Process Yes visito Completed: Hospitalized since last visit: No Patient Has Alerts: Yes Pain Present Now: Yes Patient Alerts: DMII NO ABI RIGHT R/T PAIN Electronic Signature(s) Signed: 05/30/2016 4:53:16 PM By: Montey Hora Entered By: Montey Hora on 05/30/2016 10:10:37 [name] ([id number]) -------------------------------------------------------------------------------- Clinic Level of Care Assessment Details Patient Name: [name] Date of Service: 05/30/2016 10:15 AM Medical Record Number: [id number] Patient Account Number: [id number] Date of Birth/Sex: [birth year] (65 y.o. Male) Treating RN: Montey Hora Primary Care Zenia Guest: PATIENT, NO Other Clinician: Referring Shakeia Krus: Treating Zeriah Baysinger/Extender: Frann Rider in Treatment: 1 Clinic Level of Care Assessment Items TOOL 4 Quantity Score []  - Use when only an EandM is performed on FOLLOW-UP visit 0 ASSESSMENTS - Nursing Assessment / Reassessment X - Reassessment of Co-morbidities (includes updates in patient status) 1 10 X - Reassessment of Adherence to  Treatment Plan 1 5 ASSESSMENTS - Wound and Skin Assessment / Reassessment []  - Simple Wound Assessment / Reassessment - one wound 0 X - Complex Wound Assessment / Reassessment - multiple wounds 2 5 []  - Dermatologic / Skin Assessment (not related to wound area) 0 ASSESSMENTS - Focused Assessment []  - Circumferential Edema Measurements - multi extremities 0 []  - Nutritional Assessment / Counseling / Intervention 0 X - Lower Extremity Assessment (monofilament, tuning fork, pulses) 1 5 []  - Peripheral Arterial Disease Assessment (using hand held doppler) 0 ASSESSMENTS - Ostomy and/or Continence Assessment and Care []  - Incontinence Assessment and Management 0 []  - Ostomy Care Assessment and Management (repouching, etc.) 0 PROCESS - Coordination of Care X - Simple Patient / Family Education for ongoing care 1 15 []  - Complex (extensive) Patient / Family Education for ongoing care 0 []  - Staff obtains Programmer, systems, Records, Test Results / Process Orders 0 []  - Staff telephones HHA, Nursing Homes / Clarify orders / etc 0 []  - Routine Transfer to another Facility (non-emergent condition) 0 [place], [name] ([id number]) []  - Routine Hospital Admission (non-emergent condition) 0 []  - New Admissions / Biomedical engineer / Ordering NPWT, Apligraf, etc. 0 []  - Emergency Hospital Admission (emergent condition) 0 X - Simple Discharge Coordination 1 10 []  - Complex (extensive) Discharge Coordination 0 PROCESS - Special Needs []  - Pediatric / Minor Patient Management 0 []  - Isolation Patient Management 0 []  - Hearing / Language / Visual special needs 0 []  - Assessment of Community assistance (transportation, D/C planning, etc.) 0 []  - Additional assistance / Altered mentation 0 []  - Support Surface(s) Assessment (bed, cushion, seat, etc.) 0 INTERVENTIONS - Wound Cleansing / Measurement []  - Simple Wound Cleansing - one wound 0 X - Complex Wound Cleansing - multiple wounds 2 5 X - Wound Imaging  (photographs - any number of wounds) 1 5 []  - Wound Tracing (instead of photographs) 0 []  - Simple Wound Measurement - one  wound 0 X - Complex Wound Measurement - multiple wounds 2 5 INTERVENTIONS - Wound Dressings []  - Small Wound Dressing one or multiple wounds 0 []  - Medium Wound Dressing one or multiple wounds 0 X - Large Wound Dressing one or multiple wounds 2 20 []  - Application of Medications - topical 0 []  - Application of Medications - injection 0 INTERVENTIONS - Miscellaneous []  - External ear exam 0 JONANTHAN, ANDREOLA ([id number]) []  - Specimen Collection (cultures, biopsies, blood, body fluids, etc.) 0 []  - Specimen(s) / Culture(s) sent or taken to Lab for analysis 0 []  - Patient Transfer (multiple staff / Harrel Lemon Lift / Similar devices) 0 []  - Simple Staple / Suture removal (25 or less) 0 []  - Complex Staple / Suture removal (26 or more) 0 []  - Hypo / Hyperglycemic Management (close monitor of Blood Glucose) 0 []  - Ankle / Brachial Index (ABI) - do not check if billed separately 0 X - Vital Signs 1 5 Has the patient been seen at the hospital within the last three years: Yes Total Score: 125 Level Of Care: New/Established - Level 4 Electronic Signature(s) Signed: 05/30/2016 4:53:16 PM By: Montey Hora Entered By: Montey Hora on 05/30/2016 10:36:35 [name] ([id number]) -------------------------------------------------------------------------------- Encounter Discharge Information Details Patient Name: [name] Date of Service: 05/30/2016 10:15 AM Medical Record Number: [id number] Patient Account Number: [id number] Date of Birth/Sex: 06-08-1950 (66 y.o. Male) Treating RN: Montey Hora Primary Care Rainn Zupko: PATIENT, NO Other Clinician: Referring Amatullah Christy: Treating Siobhan Zaro/Extender: Frann Rider in Treatment: 1 Encounter Discharge Information Items Discharge Pain Level: 0 Discharge Condition: Stable Ambulatory Status: Ambulatory Discharge Destination:  Home Transportation: Private Auto Accompanied By: self Schedule Follow-up Appointment: Yes Medication Reconciliation completed and provided to Patient/Care No Rice Walsh: Provided on Clinical Summary of Care: 05/30/2016 Form Type Recipient Paper Patient DT Electronic Signature(s) Signed: 05/30/2016 10:56:59 AM By: Ruthine Dose Entered By: Ruthine Dose on 05/30/2016 10:56:59 [name] ([id number]) -------------------------------------------------------------------------------- Lower Extremity Assessment Details Patient Name: [name] Date of Service: 05/30/2016 10:15 AM Medical Record Number: [id number] Patient Account Number: [id number] Date of Birth/Sex: 09-10-1950 (66 y.o. Male) Treating RN: Montey Hora Primary Care Shadoe Bethel: PATIENT, NO Other Clinician: Referring Dae Antonucci: Treating Leanette Eutsler/Extender: Frann Rider in Treatment: 1 Edema Assessment Assessed: [Left: No] [Right: No] Edema: [Left: Yes] [Right: Yes] Calf Left: Right: Point of Measurement: 34 cm From Medial Instep 39.7 cm 40.3 cm Ankle Left: Right: Point of Measurement: 12 cm From Medial Instep 27.5 cm 29.3 cm Vascular Assessment Pulses: Dorsalis Pedis Palpable: [Left:Yes] [Right:Yes] Posterior Tibial Extremity colors, hair growth, and conditions: Extremity Color: [Left:Hyperpigmented] [Right:Hyperpigmented] Hair Growth on Extremity: [Left:No] [Right:No] Temperature of Extremity: [Left:Warm] [Right:Warm] Capillary Refill: [Left:< 3 seconds] [Right:< 3 seconds] Electronic Signature(s) Signed: 05/30/2016 4:53:16 PM By: Montey Hora Entered By: Montey Hora on 05/30/2016 10:27:00 [name] ([id number]) -------------------------------------------------------------------------------- Multi Wound Chart Details Patient Name: [name] Date of Service: 05/30/2016 10:15 AM Medical Record Number: [id number] Patient Account Number: [id number] Date of Birth/Sex: 08-01-1950 (66 y.o.  Male) Treating RN: Montey Hora Primary Care Aero Drummonds: PATIENT, NO Other Clinician: Referring Clemma Johnsen: Treating Harris Kistler/Extender: Frann Rider in Treatment: 1 Vital Signs Height(in): 73 Pulse(bpm): 54 Weight(lbs): 326 Blood Pressure 137/61 (mmHg): Body Mass Index(BMI): 43 Temperature(F): 98.4 Respiratory Rate 18 (breaths/min): Photos: [N/A:N/A] Wound Location: Right Lower Leg - Medial Left Lower Leg - Medial N/A Wounding Event: Blister Blister N/A Primary Etiology: Venous Leg Ulcer Venous Leg Ulcer N/A Comorbid History: Hypertension, Type II Hypertension, Type II N/A Diabetes, Neuropathy, Diabetes, Neuropathy, Received Chemotherapy Received  Chemotherapy Date Acquired: 05/20/2016 05/20/2016 N/A Weeks of Treatment: 1 1 N/A Wound Status: Open Open N/A Measurements L x W x D 12x15.3x0.1 1.1x0.7x0.1 N/A (cm) Area (cm) : 144.199 0.605 N/A Volume (cm) : 14.42 0.06 N/A % Reduction in Area: 3.70% 84.80% N/A % Reduction in Volume: 3.70% 84.90% N/A Classification: Partial Thickness Partial Thickness N/A HBO Classification: Grade 1 Grade 1 N/A Exudate Amount: Large Large N/A Exudate Type: Serous Serous N/A Exudate Color: amber amber N/A Wound Margin: Flat and Intact Flat and Intact N/A Granulation Amount: Large (67-100%) Large (67-100%) N/A Granulation Quality: Red, Pink Pink N/A Peculiar, Leonell ([id number]) Necrotic Amount: Small (1-33%) None Present (0%) N/A Exposed Structures: Fascia: No Fascia: No N/A Fat Layer (Subcutaneous Fat Layer (Subcutaneous Tissue) Exposed: No Tissue) Exposed: No Tendon: No Tendon: No Muscle: No Muscle: No Joint: No Joint: No Bone: No Bone: No Limited to Skin Limited to Skin Breakdown Breakdown Epithelialization: Small (1-33%) Medium (34-66%) N/A Periwound Skin Texture: Excoriation: No Excoriation: No N/A Induration: No Induration: No Callus: No Callus: No Crepitus: No Crepitus: No Rash: No Rash: No Scarring:  No Scarring: No Periwound Skin Maceration: No Maceration: No N/A Moisture: Dry/Scaly: No Dry/Scaly: No Periwound Skin Color: Atrophie Blanche: No Atrophie Blanche: No N/A Cyanosis: No Cyanosis: No Ecchymosis: No Ecchymosis: No Erythema: No Erythema: No Hemosiderin Staining: No Hemosiderin Staining: No Mottled: No Mottled: No Pallor: No Pallor: No Rubor: No Rubor: No Tenderness on Yes Yes N/A Palpation: Wound Preparation: Ulcer Cleansing: Ulcer Cleansing: N/A Rinsed/Irrigated with Rinsed/Irrigated with Saline Saline Topical Anesthetic Topical Anesthetic Applied: Other: lidocaine Applied: Other: lidocaine 4% 4% Treatment Notes Electronic Signature(s) Signed: 05/30/2016 10:58:50 AM By: Christin Fudge MD, FACS Entered By: Christin Fudge on 05/30/2016 10:58:50 [name] ([id number]) -------------------------------------------------------------------------------- [place] Details Patient Name: [name] Date of Service: 05/30/2016 10:15 AM Medical Record Number: [id number] Patient Account Number: [id number] Date of Birth/Sex: Sep 18, 1950 (66 y.o. Male) Treating RN: Montey Hora Primary Care Lazaria Schaben: PATIENT, NO Other Clinician: Referring Kamayah Pillay: Treating Wagner Tanzi/Extender: Frann Rider in Treatment: 1 Active Inactive ` Abuse / Safety / Falls / Self Care Management Nursing Diagnoses: Potential for falls Goals: Patient will remain injury free Date Initiated: 05/23/2016 Target Resolution Date: 06/06/2016 Goal Status: Active Interventions: Assess fall risk on admission and as needed Notes: ` Orientation to the Wound Care Program Nursing Diagnoses: Knowledge deficit related to the wound healing center program Goals: Patient/caregiver will verbalize understanding of the [place] Date Initiated: 05/23/2016 Target Resolution Date: 06/06/2016 Goal Status: Active Interventions: Provide education on orientation to  the wound center Notes: ` Venous Leg Ulcer Nursing Diagnoses: Potential for venous Insuffiency (use before diagnosis confirmed) HARVEY, MAXON ([id number]) Goals: Non-invasive venous studies are completed as ordered Date Initiated: 05/23/2016 Target Resolution Date: 06/23/2016 Goal Status: Active Patient will maintain optimal edema control Date Initiated: 05/23/2016 Target Resolution Date: 06/06/2016 Goal Status: Active Interventions: Assess peripheral edema status every visit. Compression as ordered Notes: ` Wound/Skin Impairment Nursing Diagnoses: Impaired tissue integrity Goals: Patient/caregiver will verbalize understanding of skin care regimen Date Initiated: 05/23/2016 Target Resolution Date: 06/06/2016 Goal Status: Active Ulcer/skin breakdown will have a volume reduction of 30% by week 4 Date Initiated: 05/23/2016 Target Resolution Date: 06/20/2016 Goal Status: Active Ulcer/skin breakdown will have a volume reduction of 50% by week 8 Date Initiated: 05/23/2016 Target Resolution Date: 07/18/2016 Goal Status: Active Ulcer/skin breakdown will have a volume reduction of 80% by week 12 Date Initiated: 05/23/2016 Target Resolution Date: 07/18/2016 Goal Status: Active Ulcer/skin breakdown will  heal within 14 weeks Date Initiated: 05/23/2016 Target Resolution Date: 08/01/2016 Goal Status: Active Interventions: Assess patient/caregiver ability to obtain necessary supplies Assess patient/caregiver ability to perform ulcer/skin care regimen upon admission and as needed Assess ulceration(s) every visit Notes: Electronic Signature(s) MORELAND, DEGENOVA ([id number]) Signed: 05/30/2016 4:53:16 PM By: Montey Hora Entered By: Montey Hora on 05/30/2016 10:34:40 [name] ([id number]) -------------------------------------------------------------------------------- Pain Assessment Details Patient Name: [name] Date of Service: 05/30/2016 10:15 AM Medical Record Number:  [id number] Patient Account Number: [id number] Date of Birth/Sex: 09/14/1950 (66 y.o. Male) Treating RN: Montey Hora Primary Care Aliz Meritt: PATIENT, NO Other Clinician: Referring Immanuel Fedak: Treating Niza Soderholm/Extender: Frann Rider in Treatment: 1 Active Problems Location of Pain Severity and Description of Pain Patient Has Paino Yes Site Locations Pain Location: Pain in Ulcers With Dressing Change: Yes Duration of the Pain. Constant / Intermittento Intermittent Pain Management and Medication Current Pain Management: Notes Topical or injectable lidocaine is offered to patient for acute pain when surgical debridement is performed. If needed, Patient is instructed to use over the counter pain medication for the following 24-48 hours after debridement. Wound care MDs do not prescribed pain medications. Patient has chronic pain or uncontrolled pain. Patient has been instructed to make an appointment with their Primary Care Physician for pain management. Electronic Signature(s) Signed: 05/30/2016 4:53:16 PM By: Montey Hora Entered By: Montey Hora on 05/30/2016 10:10:50 [name] ([id number]) -------------------------------------------------------------------------------- Patient/Caregiver Education Details Patient Name: [name] Date of Service: 05/30/2016 10:15 AM Medical Record Number: [id number] Patient Account Number: [id number] Date of Birth/Gender: Sep 11, 1950 (66 y.o. Male) Treating RN: Montey Hora Primary Care Physician: PATIENT, NO Other Clinician: Referring Physician: Treating Physician/Extender: Frann Rider in Treatment: 1 Education Assessment Education Provided To: Patient Education Topics Provided Venous: Handouts: Other: leg eevation and come in for nurse visit if wrap slips down Methods: Explain/Verbal Responses: State content correctly Electronic Signature(s) Signed: 05/30/2016 4:53:16 PM By: Montey Hora Entered By: Montey Hora on 05/30/2016 10:37:22 [name] ([id number]) -------------------------------------------------------------------------------- Wound Assessment Details Patient Name: [name] Date of Service: 05/30/2016 10:15 AM Medical Record Number: [id number] Patient Account Number: [id number] Date of Birth/Sex: 1951/04/11 (66 y.o. Male) Treating RN: Montey Hora Primary Care Elena Cothern: PATIENT, NO Other Clinician: Referring Chimaobi Casebolt: Treating Elye Harmsen/Extender: Frann Rider in Treatment: 1 Wound Status Wound Number: 1 Primary Venous Leg Ulcer Etiology: Wound Location: Right Lower Leg - Medial Wound Open Wounding Event: Blister Status: Date Acquired: 05/20/2016 Comorbid Hypertension, Type II Diabetes, Weeks Of Treatment: 1 History: Neuropathy, Received Chemotherapy Clustered Wound: No Photos Wound Measurements Length: (cm) 12 Width: (cm) 15.3 Depth: (cm) 0.1 Area: (cm) 144.199 Volume: (cm) 14.42 % Reduction in Area: 3.7% % Reduction in Volume: 3.7% Epithelialization: Small (1-33%) Tunneling: No Undermining: No Wound Description Classification: Partial Thickness Foul Odor Af Diabetic Severity Earleen Newport): Grade 1 Wound Margin: Flat and Intact Exudate Amount: Large Exudate Type: Serous Exudate Color: amber ter Cleansing: No Wound Bed Granulation Amount: Large (67-100%) Exposed Structure Granulation Quality: Red, Pink Fascia Exposed: No Necrotic Amount: Small (1-33%) Fat Layer (Subcutaneous Tissue) Exposed: No Necrotic Quality: Adherent Slough Tendon Exposed: No Danby, Rollan ([id number]) Muscle Exposed: No Joint Exposed: No Bone Exposed: No Limited to Skin Breakdown Periwound Skin Texture Texture Color No Abnormalities Noted: No No Abnormalities Noted: No Callus: No Atrophie Blanche: No Crepitus: No Cyanosis: No Excoriation: No Ecchymosis: No Induration: No Erythema: No Rash: No Hemosiderin Staining: No Scarring: No Mottled: No Pallor:  No Moisture Rubor: No No Abnormalities Noted: No Dry / Scaly: No Temperature / Pain  Maceration: No Tenderness on Palpation: Yes Wound Preparation Ulcer Cleansing: Rinsed/Irrigated with Saline Topical Anesthetic Applied: Other: lidocaine 4%, Electronic Signature(s) Signed: 05/30/2016 4:53:16 PM By: Montey Hora Entered By: Montey Hora on 05/30/2016 10:22:54 [name] ([id number]) -------------------------------------------------------------------------------- Wound Assessment Details Patient Name: [name] Date of Service: 05/30/2016 10:15 AM Medical Record Number: [id number] Patient Account Number: [id number] Date of Birth/Sex: 1951/02/24 (66 y.o. Male) Treating RN: Montey Hora Primary Care Caoimhe Damron: PATIENT, NO Other Clinician: Referring Alyse Kathan: Treating Derl Abalos/Extender: Frann Rider in Treatment: 1 Wound Status Wound Number: 2 Primary Venous Leg Ulcer Etiology: Wound Location: Left Lower Leg - Medial Wound Open Wounding Event: Blister Status: Date Acquired: 05/20/2016 Comorbid Hypertension, Type II Diabetes, Weeks Of Treatment: 1 History: Neuropathy, Received Chemotherapy Clustered Wound: No Photos Wound Measurements Length: (cm) 1.1 Width: (cm) 0.7 Depth: (cm) 0.1 Area: (cm) 0.605 Volume: (cm) 0.06 % Reduction in Area: 84.8% % Reduction in Volume: 84.9% Epithelialization: Medium (34-66%) Tunneling: No Undermining: No Wound Description Classification: Partial Thickness Foul Odor A Diabetic Severity (Wagner): Grade 1 Wound Margin: Flat and Intact Exudate Amount: Large Exudate Type: Serous Exudate Color: amber fter Cleansing: No Wound Bed Granulation Amount: Large (67-100%) Exposed Structure Granulation Quality: Pink Fascia Exposed: No Necrotic Amount: None Present (0%) Fat Layer (Subcutaneous Tissue) Exposed: No Tendon Exposed: No WALEED, NAIFEH ([id number]) Muscle Exposed: No Joint Exposed: No Bone Exposed: No Limited to  Skin Breakdown Periwound Skin Texture Texture Color No Abnormalities Noted: No No Abnormalities Noted: No Callus: No Atrophie Blanche: No Crepitus: No Cyanosis: No Excoriation: No Ecchymosis: No Induration: No Erythema: No Rash: No Hemosiderin Staining: No Scarring: No Mottled: No Pallor: No Moisture Rubor: No No Abnormalities Noted: No Dry / Scaly: No Temperature / Pain Maceration: No Tenderness on Palpation: Yes Wound Preparation Ulcer Cleansing: Rinsed/Irrigated with Saline Topical Anesthetic Applied: Other: lidocaine 4%, Electronic Signature(s) Signed: 05/30/2016 4:53:16 PM By: Montey Hora Entered By: Montey Hora on 05/30/2016 10:23:18 [name] ([id number]) -------------------------------------------------------------------------------- [place] Details Patient Name: [name] Date of Service: 05/30/2016 10:15 AM Medical Record Number: [id number] Patient Account Number: [id number] Date of Birth/Sex: 03-Dec-1950 (66 y.o. Male) Treating RN: Montey Hora Primary Care Vega Withrow: PATIENT, NO Other Clinician: Referring Hakeem Frazzini: Treating Toni Demo/Extender: Frann Rider in Treatment: 1 Vital Signs Time Taken: 10:11 Temperature (F): 98.4 Height (in): 73 Pulse (bpm): 54 Weight (lbs): 326 Respiratory Rate (breaths/min): 18 Body Mass Index (BMI): 43 Blood Pressure (mmHg): 137/61 Reference Range: 80 - 120 mg / dl Electronic Signature(s) Signed: 05/30/2016 4:53:16 PM By: Montey Hora Entered By: Montey Hora on 05/30/2016 10:13:09

## 2016-06-06 ENCOUNTER — Encounter: Payer: PPO | Admitting: Surgery

## 2016-06-06 DIAGNOSIS — L97229 Non-pressure chronic ulcer of left calf with unspecified severity: Secondary | ICD-10-CM | POA: Diagnosis not present

## 2016-06-06 DIAGNOSIS — E11622 Type 2 diabetes mellitus with other skin ulcer: Secondary | ICD-10-CM | POA: Diagnosis not present

## 2016-06-06 DIAGNOSIS — I890 Lymphedema, not elsewhere classified: Secondary | ICD-10-CM | POA: Diagnosis not present

## 2016-06-06 DIAGNOSIS — L97211 Non-pressure chronic ulcer of right calf limited to breakdown of skin: Secondary | ICD-10-CM | POA: Diagnosis not present

## 2016-06-06 NOTE — Progress Notes (Addendum)
[name], [name] ([id number]) Visit Report for 06/06/2016 Chief Complaint Document Details Patient Name: [name], [name] Date of Service: 06/06/2016 8:30 AM Medical Record Number: [id number] Patient Account Number: [id number] Date of Birth/Sex: 06/26/1950 (66 y.o. Male) Treating RN: Montey Hora Primary Care Provider: PATIENT, NO Other Clinician: Referring Provider: Treating Provider/Extender: Frann Rider in Treatment: 2 Information Obtained from: Patient Chief Complaint Patient seen for complaints of Non-Healing Wound to both lower extremities with ulceration and swelling which has been going on for several months. 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 Male) Treating RN: Montey Hora Primary Care Provider: PATIENT, NO Other Clinician: Referring Provider: Treating Provider/Extender: Frann Rider in Treatment: 2 History of Present Illness Location: both lower extremities right worse than left Quality: Patient reports experiencing a dull pain to affected area(s). Severity: Patient states wound are getting worse. Duration: Patient has had the swelling for > 3 months prior to seeking treatment at the wound center. the blisters only arisen for the last week Timing: Pain in wound is Intermittent (comes and goes Context: The wound appeared gradually over time Modifying Factors: Other treatment(s) tried include:he has been put on oral doxycycline and has had Rocephin at the ER for 2 days Associated Signs and Symptoms: Patient reports having increase  swelling. HPI Description: 66 year old male who presented to the ER with bilateral lower extremity blisters which had started last week. he has a past medical history of leukemia, diabetes mellitus, hypertension, edema of both lower extremities, his recurrent skin infections, peripheral vascular disease, coronary artery disease, congestive heart failure and peripheral neuropathy. in the ER he was given Rocephin and put on Silvadene cream. he was put on oral doxycycline and was asked to follow-up with the [facility]. His last hemoglobin A1c was 6.6 in December and he checks his blood sugar once a week. He does not have any physicians outside the [place] system. He does not recall any vascular duplex studies done either for arterial or venous disease but was told to wear compression stockings which he does not use 05/30/2016 -- we have not yet received any of his notes from the [facility] hospital system and his arterial and venous duplex studies are scheduled here in [place] around mid February. We are unable to have his insurance accepted by home health agencies and hence he is getting dressings only once a week. 06/06/16 -- -- I received a call from the patient's PCP at the [facility] at [facility] and spoke to Dr. Garvin Fila, phone number [phone] and fax number [phone]. She confirmed that no vascular testing was done over the last 5 years and she would be happy to do them if the patient did want them to be done at the [place] and we could fax him a request. Electronic Signature(s) Signed: 06/06/2016 8:57:25 AM By: Christin Fudge MD, FACS Previous Signature: 06/06/2016 8:56:14 AM Version By: Christin Fudge MD, FACS Previous Signature: 06/06/2016 8:52:16 AM Version By: Christin Fudge MD, FACS Entered By: Christin Fudge on 06/06/2016 08:57:25 [name] ([id number]) -------------------------------------------------------------------------------- Physical Exam Details Patient Name: [name] Date of  Service: 06/06/2016 8:30 AM Medical Record Number: [id number] Patient Account Number: [id number] Date of Birth/Sex: May 10, 1950 (66 y.o. Male) Treating RN: Montey Hora Primary  Care Provider: PATIENT, NO Other Clinician: Referring Provider: Treating Provider/Extender: Frann Rider in Treatment: 2 Constitutional . Pulse regular. Respirations normal and unlabored. Afebrile. . Eyes Nonicteric. Reactive to light. Ears, Nose, Mouth, and Throat Lips, teeth, and gums WNL.Marland Kitchen Moist mucosa without lesions. Neck supple and nontender. No palpable supraclavicular or cervical adenopathy. Normal sized without goiter. Respiratory WNL. No retractions.. Breath sounds WNL, No rubs, rales, rhonchi, or wheeze.. Cardiovascular Heart rhythm and rate regular, no murmur or gallop.. Pedal Pulses WNL. No clubbing, cyanosis or edema. Chest Breasts symmetical and no nipple discharge.. Breast tissue WNL, no masses, lumps, or tenderness.. Lymphatic No adneopathy. No adenopathy. No adenopathy. Musculoskeletal Adexa without tenderness or enlargement.. Digits and nails w/o clubbing, cyanosis, infection, petechiae, ischemia, or inflammatory conditions.. Integumentary (Hair, Skin) No suspicious lesions. No crepitus or fluctuance. No peri-wound warmth or erythema. No masses.Marland Kitchen Psychiatric Judgement and insight Intact.. No evidence of depression, anxiety, or agitation.. Notes the lymphedema is much better on the left lower extremity and the wound is completely healed. The right side still has some open areas and there is no evidence of cellulitis in the lymphedema has come down significantly. Electronic Signature(s) Signed: 06/06/2016 8:59:28 AM By: Christin Fudge MD, FACS Entered By: Christin Fudge on 06/06/2016 08:59:28 [name] ([id number]) -------------------------------------------------------------------------------- Physician Orders Details Patient Name: [name] Date of Service: 06/06/2016 8:30  AM Medical Record Number: [id number] Patient Account Number: [id number] Date of Birth/Sex: 1950/06/12 (66 y.o. Male) Treating RN: Montey Hora Primary Care Provider: PATIENT, NO Other Clinician: Referring Provider: Treating Provider/Extender: Frann Rider in Treatment: 2 Verbal / Phone Orders: Yes Clinician: Montey Hora Read Back and Verified: Yes Diagnosis Coding Wound Cleansing Wound #1 Right,Medial Lower Leg o Clean wound with Normal Saline. Anesthetic Wound #1 Right,Medial Lower Leg o Topical Lidocaine 4% cream applied to wound bed prior to debridement Primary Wound Dressing Wound #1 Right,Medial Lower Leg o Aquacel Ag Secondary Dressing Wound #1 Right,Medial Lower Leg o ABD pad Dressing Change Frequency Wound #1 Right,Medial Lower Leg o Change dressing every week Follow-up Appointments Wound #1 Right,Medial Lower Leg o Return Appointment in 1 week. Edema Control Wound #1 Right,Medial Lower Leg o Kerlix and Coban - Bilateral - may anchor with unna if needed o Patient to wear own compression stockings - on left leg o Elevate legs to the level of the heart and pump ankles as often as possible Additional Orders / Instructions Wound #1 Right,Medial Lower Leg o Increase protein intake. o Other: - Please added vitamin a, vitamin c and zinc supplements to your diet ULICES, SASSE ([id number]) Electronic Signature(s) Signed: 06/06/2016 4:18:36 PM By: Christin Fudge MD, FACS Signed: 06/06/2016 4:46:17 PM By: Montey Hora Entered By: Montey Hora on 06/06/2016 08:55:45 [name] ([id number]) -------------------------------------------------------------------------------- Problem List Details Patient Name: [name] Date of Service: 06/06/2016 8:30 AM Medical Record Number: [id number] Patient Account Number: [id number] Date of Birth/Sex: 1950-05-25 (66 y.o. Male) Treating RN: Montey Hora Primary Care Provider: PATIENT, NO Other  Clinician: Referring Provider: Treating Provider/Extender: Frann Rider in Treatment: 2 Active Problems ICD-10 Encounter Code Description Active Date Diagnosis E11.622 Type 2 diabetes mellitus with other skin ulcer 05/23/2016 Yes I89.0 Lymphedema, not elsewhere classified 05/23/2016 Yes L97.211 Non-pressure chronic ulcer of right calf limited to 05/23/2016 Yes breakdown of skin L97.221 Non-pressure chronic ulcer of left calf limited to 05/23/2016 Yes breakdown of skin E66.01 Morbid (severe) obesity due to excess calories 05/23/2016 Yes Inactive Problems Resolved Problems Electronic Signature(s) Signed: 06/06/2016 8:57:02 AM By: Christin Fudge MD, FACS  Entered By: Christin Fudge on 06/06/2016 08:57:02 [name] ([id number]) -------------------------------------------------------------------------------- Progress Note Details Patient Name: [name], [name] Date of Service: 06/06/2016 8:30 AM Medical Record Number: [id number] Patient Account Number: [id number] Date of Birth/Sex: Jan 10, 1951 (66 y.o. Male) Treating RN: Montey Hora Primary Care Provider: PATIENT, NO Other Clinician: Referring Provider: Treating Provider/Extender: Frann Rider in Treatment: 2 Subjective Chief Complaint Information obtained from Patient Patient seen for complaints of Non-Healing Wound to both lower extremities with ulceration and swelling which has been going on for several months. He broken into large blisters about a week ago History of Present Illness (HPI) The following HPI elements were documented for the patient's wound: Location: both lower extremities right worse than left Quality: Patient reports experiencing a dull pain to affected area(s). Severity: Patient states wound are getting worse. Duration: Patient has had the swelling for > 3 months prior to seeking treatment at the wound center. the blisters only arisen for the last week Timing: Pain in wound is Intermittent (comes and  goes Context: The wound appeared gradually over time Modifying Factors: Other treatment(s) tried include:he has been put on oral doxycycline and has had Rocephin at the ER for 2 days Associated Signs and Symptoms: Patient reports having increase swelling. 66 year old male who presented to the ER with bilateral lower extremity blisters which had started last week. he has a past medical history of leukemia, diabetes mellitus, hypertension, edema of both lower extremities, his recurrent skin infections, peripheral vascular disease, coronary artery disease, congestive heart failure and peripheral neuropathy. in the ER he was given Rocephin and put on Silvadene cream. he was put on oral doxycycline and was asked to follow-up with the [facility]. His last hemoglobin A1c was 6.6 in December and he checks his blood sugar once a week. He does not have any physicians outside the [place] system. He does not recall any vascular duplex studies done either for arterial or venous disease but was told to wear compression stockings which he does not use 05/30/2016 -- we have not yet received any of his notes from the [facility] hospital system and his arterial and venous duplex studies are scheduled here in [place] around mid February. We are unable to have his insurance accepted by home health agencies and hence he is getting dressings only once a week. 06/06/16 -- -- I received a call from the patient's PCP at the [facility] and spoke to Dr. Garvin Fila, phone number [phone] and fax number [phone]. She confirmed that no vascular testing was done over the last 5 years and she would be happy to do them if the patient did want them to be done at the [place] and we could fax him a request. [name], [name] ([id number]) Objective Constitutional Pulse regular. Respirations normal and unlabored. Afebrile. Vitals Time Taken: 8:36 AM, Height: 73 in, Weight: 326 lbs, BMI: 43, Temperature: 98.3 F, Pulse: 53  bpm, Respiratory Rate: 18 breaths/min, Blood Pressure: 128/59 mmHg. Eyes Nonicteric. Reactive to light. Ears, Nose, Mouth, and Throat Lips, teeth, and gums WNL.Marland Kitchen Moist mucosa without lesions. Neck supple and nontender. No palpable supraclavicular or cervical adenopathy. Normal sized without goiter. Respiratory WNL. No retractions.. Breath sounds WNL, No rubs, rales, rhonchi, or wheeze.. Cardiovascular Heart rhythm and rate regular, no murmur or gallop.. Pedal Pulses WNL. No clubbing, cyanosis or edema. Chest Breasts symmetical and no nipple discharge.. Breast tissue WNL, no masses, lumps, or tenderness.. Lymphatic No adneopathy. No adenopathy. No adenopathy. Musculoskeletal Adexa without tenderness or enlargement.. Digits and nails  w/o clubbing, cyanosis, infection, petechiae, ischemia, or inflammatory conditions.Marland Kitchen Psychiatric Judgement and insight Intact.. No evidence of depression, anxiety, or agitation.. General Notes: the lymphedema is much better on the left lower extremity and the wound is completely healed. The right side still has some open areas and there is no evidence of cellulitis in the lymphedema has come down significantly. Integumentary (Hair, Skin) No suspicious lesions. No crepitus or fluctuance. No peri-wound warmth or erythema. No masses.. Wound #1 status is Open. Original cause of wound was Blister. The wound is located on the Right,Medial Lower Leg. The wound measures 0.2cm length x 1.1cm width x 0.1cm depth; 0.173cm^2 area and [place], Imari ([id number]) 0.017cm^3 volume. The wound is limited to skin breakdown. There is no tunneling or undermining noted. There is a large amount of serous drainage noted. The wound margin is flat and intact. There is large (67- 100%) red, pink granulation within the wound bed. There is a small (1-33%) amount of necrotic tissue within the wound bed including Adherent Slough. The periwound skin appearance did not exhibit: Callus,  Crepitus, Excoriation, Induration, Rash, Scarring, Dry/Scaly, Maceration, Atrophie Blanche, Cyanosis, Ecchymosis, Hemosiderin Staining, Mottled, Pallor, Rubor, Erythema. The periwound has tenderness on palpation. Wound #2 status is Healed - Epithelialized. Original cause of wound was Blister. The wound is located on the Left,Medial Lower Leg. The wound measures 0cm length x 0cm width x 0cm depth; 0cm^2 area and 0cm^3 volume. Assessment Active Problems ICD-10 E11.622 - Type 2 diabetes mellitus with other skin ulcer I89.0 - Lymphedema, not elsewhere classified L97.211 - Non-pressure chronic ulcer of right calf limited to breakdown of skin L97.221 - Non-pressure chronic ulcer of left calf limited to breakdown of skin E66.01 - Morbid (severe) obesity due to excess calories Plan Wound Cleansing: Wound #1 Right,Medial Lower Leg: Clean wound with Normal Saline. Anesthetic: Wound #1 Right,Medial Lower Leg: Topical Lidocaine 4% cream applied to wound bed prior to debridement Primary Wound Dressing: Wound #1 Right,Medial Lower Leg: Aquacel Ag Secondary Dressing: Wound #1 Right,Medial Lower Leg: ABD pad Dressing Change Frequency: Wound #1 Right,Medial Lower Leg: Change dressing every week Follow-up Appointments: Wound #1 Right,Medial Lower Leg: Return Appointment in 1 week. [name], [name] ([id number]) Edema Control: Wound #1 Right,Medial Lower Leg: Kerlix and Coban - Bilateral - may anchor with unna if needed Patient to wear own compression stockings - on left leg Elevate legs to the level of the heart and pump ankles as often as possible Additional Orders / Instructions: Wound #1 Right,Medial Lower Leg: Increase protein intake. Other: - Please added vitamin a, vitamin c and zinc supplements to your diet having recently spoken to his PCP and after review today, I have recommended: 1. Silver alginate to the right lower extremities with a Kerlix and Coban wrap to be changed once a  week. 2. he will wear compression stockings 20-30 mm variety on the left lower extremity. He has some given by the [place] but we will order him a fresh pair. 3. Elevation and exercise 4. Adequate protein, vitamin A, vitamin C and zinc 5. arterial duplex study and venous reflux study for both lower extremities -- mid February at [facility] 6. Regular basis to the wound center Electronic Signature(s) Signed: 06/06/2016 9:00:57 AM By: Christin Fudge MD, FACS Entered By: Christin Fudge on 06/06/2016 09:00:57 [name] ([id number]) -------------------------------------------------------------------------------- SuperBill Details Patient Name: [name] Date of Service: 06/06/2016 Medical Record Number: [id number] Patient Account Number: [id number] Date of Birth/Sex: October 09, 1950 (66 y.o. Male) Treating RN: Montey Hora Primary Care Provider:  PATIENT, NO Other Clinician: Referring Provider: Treating Provider/Extender: Frann Rider in Treatment: 2 Diagnosis Coding ICD-10 Codes Code Description E11.622 Type 2 diabetes mellitus with other skin ulcer I89.0 Lymphedema, not elsewhere classified L97.211 Non-pressure chronic ulcer of right calf limited to breakdown of skin L97.221 Non-pressure chronic ulcer of left calf limited to breakdown of skin E66.01 Morbid (severe) obesity due to excess calories Facility Procedures CPT4 Code: [id number] Description: 99213 - WOUND CARE VISIT-LEV 3 EST PT Modifier: Quantity: 1 Physician Procedures CPT4 Code Description: [id number] - WC PHYS LEVEL 3 - EST PT ICD-10 Description Diagnosis E11.622 Type 2 diabetes mellitus with other skin ulcer I89.0 Lymphedema, not elsewhere classified L97.211 Non-pressure chronic ulcer of right calf limited to  L97.221 Non-pressure chronic ulcer of left calf limited to Modifier: breakdown of sk breakdown of ski Quantity: 1 in n Electronic Signature(s) Signed: 06/06/2016 4:18:36 PM By: Christin Fudge MD, FACS Signed:  06/06/2016 4:46:17 PM By: Montey Hora Previous Signature: 06/06/2016 9:01:12 AM Version By: Christin Fudge MD, FACS Entered By: Montey Hora on 06/06/2016 09:27:26

## 2016-06-07 NOTE — Progress Notes (Signed)
[name], [name] ([id number]) Visit Report for 06/06/2016 Arrival Information Details Patient Name: JORAWAR, STUTE Date of Service: 06/06/2016 8:30 AM Medical Record Number: [id number] Patient Account Number: [id number] Date of Birth/Sex: October 17, 1950 (66 y.o. Male) Treating RN: Montey Hora Primary Care Tracker Mance: PATIENT, NO Other Clinician: Referring Wanell Lorenzi: Treating Verla Bryngelson/Extender: Frann Rider in Treatment: 2 Visit Information History Since Last Visit Added or deleted any medications: No Patient Arrived: Ambulatory Any new allergies or adverse reactions: No Arrival Time: 08:35 Had a fall or experienced change in No Accompanied By: self activities of daily living that may affect Transfer Assistance: None risk of falls: Patient Identification Verified: Yes Signs or symptoms of abuse/neglect since last No Secondary Verification Process Yes visito Completed: Hospitalized since last visit: No Patient Has Alerts: Yes Has Dressing in Place as Prescribed: Yes Patient Alerts: DMII Has Compression in Place as Prescribed: Yes NO ABI RIGHT R/T Pain Present Now: No PAIN Electronic Signature(s) Signed: 06/06/2016 4:46:17 PM By: Montey Hora Entered By: Montey Hora on 06/06/2016 08:35:47 [name] ([id number]) -------------------------------------------------------------------------------- Clinic Level of Care Assessment Details Patient Name: [name] Date of Service: 06/06/2016 8:30 AM Medical Record Number: [id number] Patient Account Number: [id number] Date of Birth/Sex: July 16, 1950 (66 y.o. Male) Treating RN: Montey Hora Primary Care Rashema Seawright: PATIENT, NO Other Clinician: Referring Caroline Longie: Treating Nashaly Dorantes/Extender: Frann Rider in Treatment: 2 Clinic Level of Care Assessment Items TOOL 4 Quantity Score []  - Use when only an EandM is performed on FOLLOW-UP visit 0 ASSESSMENTS - Nursing Assessment / Reassessment X - Reassessment of Co-morbidities  (includes updates in patient status) 1 10 X - Reassessment of Adherence to Treatment Plan 1 5 ASSESSMENTS - Wound and Skin Assessment / Reassessment []  - Simple Wound Assessment / Reassessment - one wound 0 X - Complex Wound Assessment / Reassessment - multiple wounds 2 5 []  - Dermatologic / Skin Assessment (not related to wound area) 0 ASSESSMENTS - Focused Assessment X - Circumferential Edema Measurements - multi extremities 1 5 []  - Nutritional Assessment / Counseling / Intervention 0 X - Lower Extremity Assessment (monofilament, tuning fork, pulses) 1 5 []  - Peripheral Arterial Disease Assessment (using hand held doppler) 0 ASSESSMENTS - Ostomy and/or Continence Assessment and Care []  - Incontinence Assessment and Management 0 []  - Ostomy Care Assessment and Management (repouching, etc.) 0 PROCESS - Coordination of Care X - Simple Patient / Family Education for ongoing care 1 15 []  - Complex (extensive) Patient / Family Education for ongoing care 0 []  - Staff obtains Programmer, systems, Records, Test Results / Process Orders 0 []  - Staff telephones HHA, Nursing Homes / Clarify orders / etc 0 []  - Routine Transfer to another Facility (non-emergent condition) 0 [name], [name] ([id number]) []  - Routine Hospital Admission (non-emergent condition) 0 []  - New Admissions / Biomedical engineer / Ordering NPWT, Apligraf, etc. 0 []  - Emergency Hospital Admission (emergent condition) 0 X - Simple Discharge Coordination 1 10 []  - Complex (extensive) Discharge Coordination 0 PROCESS - Special Needs []  - Pediatric / Minor Patient Management 0 []  - Isolation Patient Management 0 []  - Hearing / Language / Visual special needs 0 []  - Assessment of Community assistance (transportation, D/C planning, etc.) 0 []  - Additional assistance / Altered mentation 0 []  - Support Surface(s) Assessment (bed, cushion, seat, etc.) 0 INTERVENTIONS - Wound Cleansing / Measurement []  - Simple Wound Cleansing - one wound  0 X - Complex Wound Cleansing - multiple wounds 2 5 X - Wound Imaging (photographs - any number of wounds) 1 5 []  -  Wound Tracing (instead of photographs) 0 []  - Simple Wound Measurement - one wound 0 X - Complex Wound Measurement - multiple wounds 2 5 INTERVENTIONS - Wound Dressings []  - Small Wound Dressing one or multiple wounds 0 []  - Medium Wound Dressing one or multiple wounds 0 X - Large Wound Dressing one or multiple wounds 1 20 []  - Application of Medications - topical 0 []  - Application of Medications - injection 0 INTERVENTIONS - Miscellaneous []  - External ear exam 0 [name], [name] ([id number]) []  - Specimen Collection (cultures, biopsies, blood, body fluids, etc.) 0 []  - Specimen(s) / Culture(s) sent or taken to Lab for analysis 0 []  - Patient Transfer (multiple staff / Harrel Lemon Lift / Similar devices) 0 []  - Simple Staple / Suture removal (25 or less) 0 []  - Complex Staple / Suture removal (26 or more) 0 []  - Hypo / Hyperglycemic Management (close monitor of Blood Glucose) 0 []  - Ankle / Brachial Index (ABI) - do not check if billed separately 0 X - Vital Signs 1 5 Has the patient been seen at the hospital within the last three years: Yes Total Score: 110 Level Of Care: New/Established - Level 3 Electronic Signature(s) Signed: 06/06/2016 4:46:17 PM By: Montey Hora Entered By: Montey Hora on 06/06/2016 09:27:17 [name] ([id number]) -------------------------------------------------------------------------------- Encounter Discharge Information Details Patient Name: [name] Date of Service: 06/06/2016 8:30 AM Medical Record Number: [id number] Patient Account Number: [id number] Date of Birth/Sex: 1951/03/27 (66 y.o. Male) Treating RN: Montey Hora Primary Care Hairo Garraway: PATIENT, NO Other Clinician: Referring Chasmine Lender: Treating Dayten Juba/Extender: Frann Rider in Treatment: 2 Encounter Discharge Information Items Discharge Pain Level: 0 Discharge  Condition: Stable Ambulatory Status: Ambulatory Discharge Destination: Home Transportation: Private Auto Accompanied By: self Schedule Follow-up Appointment: Yes Medication Reconciliation completed and provided to Patient/Care No Mitchelle Goerner: Provided on Clinical Summary of Care: 06/06/2016 Form Type Recipient Paper Patient DT Electronic Signature(s) Signed: 06/06/2016 4:46:17 PM By: Montey Hora Previous Signature: 06/06/2016 9:13:02 AM Version By: Ruthine Dose Entered By: Montey Hora on 06/06/2016 09:28:10 [name] ([id number]) -------------------------------------------------------------------------------- Lower Extremity Assessment Details Patient Name: [name] Date of Service: 06/06/2016 8:30 AM Medical Record Number: [id number] Patient Account Number: [id number] Date of Birth/Sex: [birth year] (66 y.o. Male) Treating RN: Montey Hora Primary Care Mekai Wilkinson: PATIENT, NO Other Clinician: Referring Jamey Harman: Treating Ania Levay/Extender: Frann Rider in Treatment: 2 Edema Assessment Assessed: [Left: No] [Right: No] Edema: [Left: Ye] [Right: s] Calf Left: Right: Point of Measurement: 34 cm From Medial Instep cm 39.8 cm Ankle Left: Right: Point of Measurement: 12 cm From Medial Instep cm 27.9 cm Vascular Assessment Pulses: Dorsalis Pedis Palpable: [Right:Yes] Posterior Tibial Extremity colors, hair growth, and conditions: Extremity Color: [Right:Hyperpigmented] Hair Growth on Extremity: [Right:No] Temperature of Extremity: [Right:Warm] Capillary Refill: [Right:< 3 seconds] Electronic Signature(s) Signed: 06/06/2016 4:46:17 PM By: Montey Hora Entered By: Montey Hora on 06/06/2016 08:54:53 [name] ([id number]) -------------------------------------------------------------------------------- Multi Wound Chart Details Patient Name: [name] Date of Service: 06/06/2016 8:30 AM Medical Record Number: [id number] Patient Account Number:  [id number] Date of Birth/Sex: 1950/09/19 (66 y.o. Male) Treating RN: Montey Hora Primary Care Merlina Marchena: PATIENT, NO Other Clinician: Referring Kennth Vanbenschoten: Treating Mehek Grega/Extender: Frann Rider in Treatment: 2 Vital Signs Height(in): 73 Pulse(bpm): 53 Weight(lbs): 326 Blood Pressure 128/59 (mmHg): Body Mass Index(BMI): 43 Temperature(F): 98.3 Respiratory Rate 18 (breaths/min): Photos: [2:No Photos] [N/A:N/A] Wound Location: Right Lower Leg - Medial Left, Medial Lower Leg N/A Wounding Event: Blister Blister N/A Primary Etiology: Venous Leg Ulcer Venous Leg Ulcer N/A Comorbid History:  Hypertension, Type II N/A N/A Diabetes, Neuropathy, Received Chemotherapy Date Acquired: 05/20/2016 05/20/2016 N/A Weeks of Treatment: 2 2 N/A Wound Status: Open Healed - Epithelialized N/A Measurements L x W x D 0.2x1.1x0.1 0x0x0 N/A (cm) Area (cm) : 0.173 0 N/A Volume (cm) : 0.017 0 N/A % Reduction in Area: 99.90% 100.00% N/A % Reduction in Volume: 99.90% 100.00% N/A Classification: Partial Thickness Partial Thickness N/A HBO Classification: Grade 1 N/A N/A Exudate Amount: Large N/A N/A Exudate Type: Serous N/A N/A Exudate Color: amber N/A N/A Wound Margin: Flat and Intact N/A N/A Granulation Amount: Large (67-100%) N/A N/A Granulation Quality: Red, Pink N/A N/A [name], [name] ([id number]) Necrotic Amount: Small (1-33%) N/A N/A Exposed Structures: Fascia: No N/A N/A Fat Layer (Subcutaneous Tissue) Exposed: No Tendon: No Muscle: No Joint: No Bone: No Limited to Skin Breakdown Epithelialization: Large (67-100%) N/A N/A Periwound Skin Texture: Excoriation: No No Abnormalities Noted N/A Induration: No Callus: No Crepitus: No Rash: No Scarring: No Periwound Skin Maceration: No No Abnormalities Noted N/A Moisture: Dry/Scaly: No Periwound Skin Color: Atrophie Blanche: No No Abnormalities Noted N/A Cyanosis: No Ecchymosis: No Erythema: No Hemosiderin Staining:  No Mottled: No Pallor: No Rubor: No Tenderness on Yes No N/A Palpation: Wound Preparation: Ulcer Cleansing: N/A N/A Rinsed/Irrigated with Saline Topical Anesthetic Applied: Other: lidocaine 4% Treatment Notes Electronic Signature(s) Signed: 06/06/2016 8:57:06 AM By: Christin Fudge MD, FACS Entered By: Christin Fudge on 06/06/2016 08:57:06 [name] ([id number]) -------------------------------------------------------------------------------- [place] Details Patient Name: [name] Date of Service: 06/06/2016 8:30 AM Medical Record Number: [id number] Patient Account Number: [id number] Date of Birth/Sex: 03-22-1951 (66 y.o. Male) Treating RN: Montey Hora Primary Care Kao Berkheimer: PATIENT, NO Other Clinician: Referring Lucilia Yanni: Treating Aimy Sweeting/Extender: Frann Rider in Treatment: 2 Active Inactive ` Abuse / Safety / Falls / Self Care Management Nursing Diagnoses: Potential for falls Goals: Patient will remain injury free Date Initiated: 05/23/2016 Target Resolution Date: 06/06/2016 Goal Status: Active Interventions: Assess fall risk on admission and as needed Notes: ` Orientation to the Wound Care Program Nursing Diagnoses: Knowledge deficit related to the wound healing center program Goals: Patient/caregiver will verbalize understanding of the [place] Date Initiated: 05/23/2016 Target Resolution Date: 06/06/2016 Goal Status: Active Interventions: Provide education on orientation to the wound center Notes: ` Venous Leg Ulcer Nursing Diagnoses: Potential for venous Insuffiency (use before diagnosis confirmed) [name], [name] ([id number]) Goals: Non-invasive venous studies are completed as ordered Date Initiated: 05/23/2016 Target Resolution Date: 06/23/2016 Goal Status: Active Patient will maintain optimal edema control Date Initiated: 05/23/2016 Target Resolution Date: 06/06/2016 Goal Status:  Active Interventions: Assess peripheral edema status every visit. Compression as ordered Notes: ` Wound/Skin Impairment Nursing Diagnoses: Impaired tissue integrity Goals: Patient/caregiver will verbalize understanding of skin care regimen Date Initiated: 05/23/2016 Target Resolution Date: 06/06/2016 Goal Status: Active Ulcer/skin breakdown will have a volume reduction of 30% by week 4 Date Initiated: 05/23/2016 Target Resolution Date: 06/20/2016 Goal Status: Active Ulcer/skin breakdown will have a volume reduction of 50% by week 8 Date Initiated: 05/23/2016 Target Resolution Date: 07/18/2016 Goal Status: Active Ulcer/skin breakdown will have a volume reduction of 80% by week 12 Date Initiated: 05/23/2016 Target Resolution Date: 07/18/2016 Goal Status: Active Ulcer/skin breakdown will heal within 14 weeks Date Initiated: 05/23/2016 Target Resolution Date: 08/01/2016 Goal Status: Active Interventions: Assess patient/caregiver ability to obtain necessary supplies Assess patient/caregiver ability to perform ulcer/skin care regimen upon admission and as needed Assess ulceration(s) every visit Notes: Electronic Signature(s) [name], [name] ([id number]) Signed: 06/06/2016 4:46:17 PM By: Marjory Lies,  Di Kindle Entered By: Montey Hora on 06/06/2016 08:55:05 [name] ([id number]) -------------------------------------------------------------------------------- Pain Assessment Details Patient Name: [name], [name] Date of Service: 06/06/2016 8:30 AM Medical Record Number: [id number] Patient Account Number: [id number] Date of Birth/Sex: August 08, 1950 (66 y.o. Male) Treating RN: Montey Hora Primary Care Tobey Schmelzle: PATIENT, NO Other Clinician: Referring Emmer Lillibridge: Treating Jaedyn Marrufo/Extender: Frann Rider in Treatment: 2 Active Problems Location of Pain Severity and Description of Pain Patient Has Paino No Site Locations Pain Management and Medication Current Pain  Management: Notes Topical or injectable lidocaine is offered to patient for acute pain when surgical debridement is performed. If needed, Patient is instructed to use over the counter pain medication for the following 24-48 hours after debridement. Wound care MDs do not prescribed pain medications. Patient has chronic pain or uncontrolled pain. Patient has been instructed to make an appointment with their Primary Care Physician for pain management. Electronic Signature(s) Signed: 06/06/2016 4:46:17 PM By: Montey Hora Entered By: Montey Hora on 06/06/2016 08:35:55 [name] ([id number]) -------------------------------------------------------------------------------- Patient/Caregiver Education Details Patient Name: [name] Date of Service: 06/06/2016 8:30 AM Medical Record Number: [id number] Patient Account Number: [id number] Date of Birth/Gender: 08-07-1950 (67 y.o. Male) Treating RN: Montey Hora Primary Care Physician: PATIENT, NO Other Clinician: Referring Physician: Treating Physician/Extender: Frann Rider in Treatment: 2 Education Assessment Education Provided To: Patient Education Topics Provided Venous: Handouts: Other: wear compression on left eg Methods: Explain/Verbal Responses: State content correctly Electronic Signature(s) Signed: 06/06/2016 4:46:17 PM By: Montey Hora Entered By: Montey Hora on 06/06/2016 09:28:29 [name] ([id number]) -------------------------------------------------------------------------------- Wound Assessment Details Patient Name: [name] Date of Service: 06/06/2016 8:30 AM Medical Record Number: [id number] Patient Account Number: [id number] Date of Birth/Sex: 09/20/1950 (66 y.o. Male) Treating RN: Montey Hora Primary Care Laylee Schooley: PATIENT, NO Other Clinician: Referring Marquel Spoto: Treating Donivin Wirt/Extender: Frann Rider in Treatment: 2 Wound Status Wound Number: 1 Primary Venous Leg  Ulcer Etiology: Wound Location: Right Lower Leg - Medial Wound Open Wounding Event: Blister Status: Date Acquired: 05/20/2016 Comorbid Hypertension, Type II Diabetes, Weeks Of Treatment: 2 History: Neuropathy, Received Chemotherapy Clustered Wound: No Photos Wound Measurements Length: (cm) 0.2 Width: (cm) 1.1 Depth: (cm) 0.1 Area: (cm) 0.173 Volume: (cm) 0.017 % Reduction in Area: 99.9% % Reduction in Volume: 99.9% Epithelialization: Large (67-100%) Tunneling: No Undermining: No Wound Description Classification: Partial Thickness Foul Odor Af Diabetic Severity (Wagner): Grade 1 Slough/Fibri Wound Margin: Flat and Intact Exudate Amount: Large Exudate Type: Serous Exudate Color: amber ter Cleansing: No no No Wound Bed Granulation Amount: Large (67-100%) Exposed Structure Granulation Quality: Red, Pink Fascia Exposed: No Necrotic Amount: Small (1-33%) Fat Layer (Subcutaneous Tissue) Exposed: No Necrotic Quality: Adherent Slough Tendon Exposed: No [place], [name] ([id number]) Muscle Exposed: No Joint Exposed: No Bone Exposed: No Limited to Skin Breakdown Periwound Skin Texture Texture Color No Abnormalities Noted: No No Abnormalities Noted: No Callus: No Atrophie Blanche: No Crepitus: No Cyanosis: No Excoriation: No Ecchymosis: No Induration: No Erythema: No Rash: No Hemosiderin Staining: No Scarring: No Mottled: No Pallor: No Moisture Rubor: No No Abnormalities Noted: No Dry / Scaly: No Temperature / Pain Maceration: No Tenderness on Palpation: Yes Wound Preparation Ulcer Cleansing: Rinsed/Irrigated with Saline Topical Anesthetic Applied: Other: lidocaine 4%, Treatment Notes Wound #1 (Right, Medial Lower Leg) 1. Cleansed with: Clean wound with Normal Saline 4. Dressing Applied: Aquacel Ag 5. Secondary Dressing Applied ABD Pad 7. Secured with Other (specify in notes) Notes kerlix and coban wrap from toes to 3cm below the knee Electronic  Signature(s) Signed: 06/06/2016 4:46:17 PM By:  Dorthy, Di Kindle Entered By: Montey Hora on 06/06/2016 08:54:15 [name] ([id number]) -------------------------------------------------------------------------------- Wound Assessment Details Patient Name: [name], [name] Date of Service: 06/06/2016 8:30 AM Medical Record Number: [id number] Patient Account Number: [id number] Date of Birth/Sex: 1950-07-08 (66 y.o. Male) Treating RN: Montey Hora Primary Care Aljean Horiuchi: PATIENT, NO Other Clinician: Referring Keyshon Stein: Treating Seline Enzor/Extender: Frann Rider in Treatment: 2 Wound Status Wound Number: 2 Primary Etiology: Venous Leg Ulcer Wound Location: Left, Medial Lower Leg Wound Status: Healed - Epithelialized Wounding Event: Blister Date Acquired: 05/20/2016 Weeks Of Treatment: 2 Clustered Wound: No Photos Photo Uploaded By: Montey Hora on 06/06/2016 09:30:10 Wound Measurements Length: (cm) 0 % Reduction in Width: (cm) 0 % Reduction in Depth: (cm) 0 Area: (cm) 0 Volume: (cm) 0 Area: 100% Volume: 100% Wound Description Classification: Partial Thickness Periwound Skin Texture Texture Color No Abnormalities Noted: No No Abnormalities Noted: No Moisture No Abnormalities Noted: No Electronic Signature(s) Signed: 06/06/2016 4:46:17 PM By: Zella Richer, [name] ([id number]) Entered By: Montey Hora on 06/06/2016 08:50:01 [name] ([id number]) -------------------------------------------------------------------------------- [place] Details Patient Name: [name] Date of Service: 06/06/2016 8:30 AM Medical Record Number: [id number] Patient Account Number: [id number] Date of Birth/Sex: 1951-01-01 (66 y.o. Male) Treating RN: Montey Hora Primary Care Tin Engram: PATIENT, NO Other Clinician: Referring Fantashia Shupert: Treating Minaal Struckman/Extender: Frann Rider in Treatment: 2 Vital Signs Time Taken: 08:36 Temperature (F): 98.3 Height (in):  73 Pulse (bpm): 53 Weight (lbs): 326 Respiratory Rate (breaths/min): 18 Body Mass Index (BMI): 43 Blood Pressure (mmHg): 128/59 Reference Range: 80 - 120 mg / dl Electronic Signature(s) Signed: 06/06/2016 4:46:17 PM By: Montey Hora Entered By: Montey Hora on 06/06/2016 08:38:27

## 2016-06-13 ENCOUNTER — Encounter: Payer: PPO | Attending: Surgery | Admitting: Surgery

## 2016-06-13 DIAGNOSIS — Z7984 Long term (current) use of oral hypoglycemic drugs: Secondary | ICD-10-CM | POA: Diagnosis not present

## 2016-06-13 DIAGNOSIS — I110 Hypertensive heart disease with heart failure: Secondary | ICD-10-CM | POA: Diagnosis not present

## 2016-06-13 DIAGNOSIS — L97229 Non-pressure chronic ulcer of left calf with unspecified severity: Secondary | ICD-10-CM | POA: Diagnosis not present

## 2016-06-13 DIAGNOSIS — Z79899 Other long term (current) drug therapy: Secondary | ICD-10-CM | POA: Insufficient documentation

## 2016-06-13 DIAGNOSIS — I509 Heart failure, unspecified: Secondary | ICD-10-CM | POA: Diagnosis not present

## 2016-06-13 DIAGNOSIS — I890 Lymphedema, not elsewhere classified: Secondary | ICD-10-CM | POA: Insufficient documentation

## 2016-06-13 DIAGNOSIS — L97211 Non-pressure chronic ulcer of right calf limited to breakdown of skin: Secondary | ICD-10-CM | POA: Diagnosis not present

## 2016-06-13 DIAGNOSIS — Z87891 Personal history of nicotine dependence: Secondary | ICD-10-CM | POA: Insufficient documentation

## 2016-06-13 DIAGNOSIS — E1140 Type 2 diabetes mellitus with diabetic neuropathy, unspecified: Secondary | ICD-10-CM | POA: Insufficient documentation

## 2016-06-13 DIAGNOSIS — Z6841 Body Mass Index (BMI) 40.0 and over, adult: Secondary | ICD-10-CM | POA: Insufficient documentation

## 2016-06-13 DIAGNOSIS — I2510 Atherosclerotic heart disease of native coronary artery without angina pectoris: Secondary | ICD-10-CM | POA: Diagnosis not present

## 2016-06-13 DIAGNOSIS — E11622 Type 2 diabetes mellitus with other skin ulcer: Secondary | ICD-10-CM | POA: Insufficient documentation

## 2016-06-13 DIAGNOSIS — L97221 Non-pressure chronic ulcer of left calf limited to breakdown of skin: Secondary | ICD-10-CM | POA: Diagnosis not present

## 2016-06-13 DIAGNOSIS — L97219 Non-pressure chronic ulcer of right calf with unspecified severity: Secondary | ICD-10-CM | POA: Diagnosis not present

## 2016-06-13 NOTE — Progress Notes (Signed)
TEGH, BRYS ([id number]) Visit Report for 06/13/2016 Arrival Information Details Patient Name: [name], [name] Date of Service: 06/13/2016 9:15 AM Medical Record Number: [id number] Patient Account Number: [id number] Date of Birth/Sex: 14-Sep-1950 (66 y.o. Male) Treating RN: Montey Hora Primary Care Vanessa Alesi: PATIENT, NO Other Clinician: Referring Tecumseh Yeagley: Treating Syrena Burges/Extender: Frann Rider in Treatment: 3 Visit Information History Since Last Visit Added or deleted any medications: No Patient Arrived: Ambulatory Any new allergies or adverse reactions: No Arrival Time: 09:07 Had a fall or experienced change in No Accompanied By: self activities of daily living that may affect Transfer Assistance: None risk of falls: Patient Identification Verified: Yes Signs or symptoms of abuse/neglect since last No Secondary Verification Process Yes visito Completed: Hospitalized since last visit: No Patient Has Alerts: Yes Has Dressing in Place as Prescribed: Yes Patient Alerts: DMII Has Compression in Place as Prescribed: Yes NO ABI RIGHT R/T Pain Present Now: No PAIN Electronic Signature(s) Signed: 06/13/2016 4:09:53 PM By: Montey Hora Entered By: Montey Hora on 06/13/2016 09:07:42 [name] ([id number]) -------------------------------------------------------------------------------- Clinic Level of Care Assessment Details Patient Name: [name] Date of Service: 06/13/2016 9:15 AM Medical Record Number: [id number] Patient Account Number: [id number] Date of Birth/Sex: [birth year] (66 y.o. Male) Treating RN: Montey Hora Primary Care Jeanne Terrance: PATIENT, NO Other Clinician: Referring Fiorela Pelzer: Treating Young Brim/Extender: Frann Rider in Treatment: 3 Clinic Level of Care Assessment Items TOOL 4 Quantity Score []  - Use when only an EandM is performed on FOLLOW-UP visit 0 ASSESSMENTS - Nursing Assessment / Reassessment X - Reassessment of Co-morbidities  (includes updates in patient status) 1 10 X - Reassessment of Adherence to Treatment Plan 1 5 ASSESSMENTS - Wound and Skin Assessment / Reassessment X - Simple Wound Assessment / Reassessment - one wound 1 5 []  - Complex Wound Assessment / Reassessment - multiple wounds 0 []  - Dermatologic / Skin Assessment (not related to wound area) 0 ASSESSMENTS - Focused Assessment X - Circumferential Edema Measurements - multi extremities 1 5 []  - Nutritional Assessment / Counseling / Intervention 0 X - Lower Extremity Assessment (monofilament, tuning fork, pulses) 1 5 []  - Peripheral Arterial Disease Assessment (using hand held doppler) 0 ASSESSMENTS - Ostomy and/or Continence Assessment and Care []  - Incontinence Assessment and Management 0 []  - Ostomy Care Assessment and Management (repouching, etc.) 0 PROCESS - Coordination of Care []  - Simple Patient / Family Education for ongoing care 0 X - Complex (extensive) Patient / Family Education for ongoing care 1 20 []  - Staff obtains Programmer, systems, Records, Test Results / Process Orders 0 []  - Staff telephones HHA, Nursing Homes / Clarify orders / etc 0 []  - Routine Transfer to another Facility (non-emergent condition) 0 [name], [name] ([id number]) []  - Routine Hospital Admission (non-emergent condition) 0 []  - New Admissions / Biomedical engineer / Ordering NPWT, Apligraf, etc. 0 []  - Emergency Hospital Admission (emergent condition) 0 X - Simple Discharge Coordination 1 10 []  - Complex (extensive) Discharge Coordination 0 PROCESS - Special Needs []  - Pediatric / Minor Patient Management 0 []  - Isolation Patient Management 0 []  - Hearing / Language / Visual special needs 0 []  - Assessment of Community assistance (transportation, D/C planning, etc.) 0 []  - Additional assistance / Altered mentation 0 []  - Support Surface(s) Assessment (bed, cushion, seat, etc.) 0 INTERVENTIONS - Wound Cleansing / Measurement X - Simple Wound Cleansing - one wound 1  5 []  - Complex Wound Cleansing - multiple wounds 0 X - Wound Imaging (photographs - any number of wounds) 1 5 []  -  Wound Tracing (instead of photographs) 0 X - Simple Wound Measurement - one wound 1 5 []  - Complex Wound Measurement - multiple wounds 0 INTERVENTIONS - Wound Dressings []  - Small Wound Dressing one or multiple wounds 0 []  - Medium Wound Dressing one or multiple wounds 0 []  - Large Wound Dressing one or multiple wounds 0 []  - Application of Medications - topical 0 []  - Application of Medications - injection 0 INTERVENTIONS - Miscellaneous []  - External ear exam 0 [name], [name] ([id number]) []  - Specimen Collection (cultures, biopsies, blood, body fluids, etc.) 0 []  - Specimen(s) / Culture(s) sent or taken to Lab for analysis 0 []  - Patient Transfer (multiple staff / Harrel Lemon Lift / Similar devices) 0 []  - Simple Staple / Suture removal (25 or less) 0 []  - Complex Staple / Suture removal (26 or more) 0 []  - Hypo / Hyperglycemic Management (close monitor of Blood Glucose) 0 []  - Ankle / Brachial Index (ABI) - do not check if billed separately 0 X - Vital Signs 1 5 Has the patient been seen at the hospital within the last three years: Yes Total Score: 80 Level Of Care: New/Established - Level 3 Electronic Signature(s) Signed: 06/13/2016 4:09:53 PM By: Montey Hora Entered By: Montey Hora on 06/13/2016 09:29:47 [name] ([id number]) -------------------------------------------------------------------------------- Encounter Discharge Information Details Patient Name: [name] Date of Service: 06/13/2016 9:15 AM Medical Record Number: [id number] Patient Account Number: [id number] Date of Birth/Sex: July 25, 1950 (66 y.o. Male) Treating RN: Montey Hora Primary Care Allisson Schindel: PATIENT, NO Other Clinician: Referring Alaria Oconnor: Treating Charlesa Ehle/Extender: Frann Rider in Treatment: 3 Encounter Discharge Information Items Discharge Pain Level: 0 Discharge  Condition: Stable Ambulatory Status: Ambulatory Discharge Destination: Home Transportation: Private Auto Accompanied By: self Schedule Follow-up Appointment: No Medication Reconciliation completed and provided to Patient/Care No Yvonnie Schinke: Provided on Clinical Summary of Care: 06/13/2016 Form Type Recipient Paper Patient DT Electronic Signature(s) Signed: 06/13/2016 4:09:53 PM By: Montey Hora Previous Signature: 06/13/2016 9:31:24 AM Version By: Ruthine Dose Entered By: Montey Hora on 06/13/2016 09:40:32 [name] ([id number]) -------------------------------------------------------------------------------- Lower Extremity Assessment Details Patient Name: [name] Date of Service: 06/13/2016 9:15 AM Medical Record Number: [id number] Patient Account Number: [id number] Date of Birth/Sex: Sep 27, 1950 (66 y.o. Male) Treating RN: Montey Hora Primary Care Lianah Peed: PATIENT, NO Other Clinician: Referring Mandy Peeks: Treating Ranyah Groeneveld/Extender: Frann Rider in Treatment: 3 Edema Assessment Assessed: [Left: No] [Right: No] E[Left: dema] [Right: :] Calf Left: Right: Point of Measurement: 34 cm From Medial Instep cm 39.4 cm Ankle Left: Right: Point of Measurement: 12 cm From Medial Instep cm 27.5 cm Vascular Assessment Pulses: Dorsalis Pedis Palpable: [Right:Yes] Posterior Tibial Extremity colors, hair growth, and conditions: Extremity Color: [Right:Hyperpigmented] Hair Growth on Extremity: [Right:No] Temperature of Extremity: [Right:Warm] Capillary Refill: [Right:< 3 seconds] Electronic Signature(s) Signed: 06/13/2016 4:09:53 PM By: Montey Hora Entered By: Montey Hora on 06/13/2016 09:14:29 [name] ([id number]) -------------------------------------------------------------------------------- Multi Wound Chart Details Patient Name: [name] Date of Service: 06/13/2016 9:15 AM Medical Record Number: [id number] Patient Account Number: [id number] Date  of Birth/Sex: 12/25/1950 (66 y.o. Male) Treating RN: Montey Hora Primary Care Darnette Lampron: PATIENT, NO Other Clinician: Referring Aadith Raudenbush: Treating Yuuki Skeens/Extender: Frann Rider in Treatment: 3 Vital Signs Height(in): 73 Pulse(bpm): 51 Weight(lbs): 326 Blood Pressure 131/51 (mmHg): Body Mass Index(BMI): 43 Temperature(F): 98.2 Respiratory Rate 18 (breaths/min): Photos: [1:No Photos] [N/A:N/A] Wound Location: [1:Right, Medial Lower Leg] [N/A:N/A] Wounding Event: [1:Blister] [N/A:N/A] Primary Etiology: [1:Venous Leg Ulcer] [N/A:N/A] Date Acquired: [1:05/20/2016] [N/A:N/A] Weeks of Treatment: [1:3] [N/A:N/A] Wound Status: [1:Open] [N/A:N/A]  Measurements L x W x D 0x0x0 [N/A:N/A] (cm) Area (cm) : [1:0] [N/A:N/A] Volume (cm) : [1:0] [N/A:N/A] % Reduction in Area: [1:100.00%] [N/A:N/A] % Reduction in Volume: 100.00% [N/A:N/A] Classification: [1:Partial Thickness] [N/A:N/A] Periwound Skin Texture: No Abnormalities Noted [N/A:N/A] Periwound Skin [1:No Abnormalities Noted] [N/A:N/A] Moisture: Periwound Skin Color: No Abnormalities Noted [N/A:N/A] Tenderness on [1:No] [N/A:N/A] Treatment Notes Electronic Signature(s) Signed: 06/13/2016 9:26:14 AM By: Christin Fudge MD, FACS Entered By: Christin Fudge on 06/13/2016 09:26:14 [name] ([id number]) -------------------------------------------------------------------------------- [place] Details Patient Name: [name] Date of Service: 06/13/2016 9:15 AM Medical Record Number: [id number] Patient Account Number: [id number] Date of Birth/Sex: [birth year] (66 y.o. Male) Treating RN: Montey Hora Primary Care Haset Oaxaca: PATIENT, NO Other Clinician: Referring Jessenia Filippone: Treating Abigayl Hor/Extender: Frann Rider in Treatment: 3 Active Inactive Electronic Signature(s) Signed: 06/13/2016 4:09:53 PM By: Montey Hora Entered By: Montey Hora on 06/13/2016 09:24:10 [name]  ([id number]) -------------------------------------------------------------------------------- Pain Assessment Details Patient Name: [name] Date of Service: 06/13/2016 9:15 AM Medical Record Number: [id number] Patient Account Number: [id number] Date of Birth/Sex: 1951-01-22 (66 y.o. Male) Treating RN: Montey Hora Primary Care Jorell Agne: PATIENT, NO Other Clinician: Referring Berdene Askari: Treating Sadiyah Kangas/Extender: Frann Rider in Treatment: 3 Active Problems Location of Pain Severity and Description of Pain Patient Has Paino No Site Locations Pain Management and Medication Current Pain Management: Notes Topical or injectable lidocaine is offered to patient for acute pain when surgical debridement is performed. If needed, Patient is instructed to use over the counter pain medication for the following 24-48 hours after debridement. Wound care MDs do not prescribed pain medications. Patient has chronic pain or uncontrolled pain. Patient has been instructed to make an appointment with their Primary Care Physician for pain management. Electronic Signature(s) Signed: 06/13/2016 4:09:53 PM By: Montey Hora Entered By: Montey Hora on 06/13/2016 09:07:50 [name] ([id number]) -------------------------------------------------------------------------------- Patient/Caregiver Education Details Patient Name: [name] Date of Service: 06/13/2016 9:15 AM Medical Record Number: [id number] Patient Account Number: [id number] Date of Birth/Gender: Jul 26, 1950 (66 y.o. Male) Treating RN: Montey Hora Primary Care Physician: PATIENT, NO Other Clinician: Referring Physician: Treating Physician/Extender: Frann Rider in Treatment: 3 Education Assessment Education Provided To: Patient Education Topics Provided Venous: Handouts: Other: compression daily Methods: Explain/Verbal, Printed Responses: State content correctly Electronic Signature(s) Signed: 06/13/2016  4:09:53 PM By: Montey Hora Entered By: Montey Hora on 06/13/2016 09:40:49 [name] ([id number]) -------------------------------------------------------------------------------- Wound Assessment Details Patient Name: [name] Date of Service: 06/13/2016 9:15 AM Medical Record Number: [id number] Patient Account Number: [id number] Date of Birth/Sex: 1951/03/09 (66 y.o. Male) Treating RN: Montey Hora Primary Care Eular Panek: PATIENT, NO Other Clinician: Referring Lorie Cleckley: Treating Lyfe Monger/Extender: Frann Rider in Treatment: 3 Wound Status Wound Number: 1 Primary Etiology: Venous Leg Ulcer Wound Location: Right, Medial Lower Leg Wound Status: Open Wounding Event: Blister Date Acquired: 05/20/2016 Weeks Of Treatment: 3 Clustered Wound: No Photos Photo Uploaded By: Montey Hora on 06/13/2016 09:41:01 Wound Measurements Length: (cm) 0 % Reduction Width: (cm) 0 % Reduction Depth: (cm) 0 Area: (cm) 0 Volume: (cm) 0 in Area: 100% in Volume: 100% Wound Description Classification: Partial Thickness Periwound Skin Texture Texture Color No Abnormalities Noted: No No Abnormalities Noted: No Moisture No Abnormalities Noted: No Electronic Signature(s) Signed: 06/13/2016 4:09:53 PM By: Zella Richer, [name] ([id number]) Entered By: Montey Hora on 06/13/2016 09:14:01 [name] ([id number]) -------------------------------------------------------------------------------- [place] Details Patient Name: [name] Date of Service: 06/13/2016 9:15 AM Medical Record Number: [id number] Patient Account Number: [id number] Date of Birth/Sex: 1950/09/12 (66 y.o. Male) Treating RN: Marjory Lies,  Di Kindle Primary Care Koleton Duchemin: PATIENT, NO Other Clinician: Referring Iverna Hammac: Treating Jaydyn Menon/Extender: Frann Rider in Treatment: 3 Vital Signs Time Taken: 09:08 Temperature (F): 98.2 Height (in): 73 Pulse (bpm): 51 Weight (lbs): 326 Respiratory Rate  (breaths/min): 18 Body Mass Index (BMI): 43 Blood Pressure (mmHg): 131/51 Reference Range: 80 - 120 mg / dl Electronic Signature(s) Signed: 06/13/2016 4:09:53 PM By: Montey Hora Entered By: Montey Hora on 06/13/2016 09:08:55

## 2016-06-13 NOTE — Progress Notes (Signed)
JAREE, REMPE ([id number]) Visit Report for 06/13/2016 Chief Complaint Document Details Patient Name: [name], [name] Date of Service: 06/13/2016 9:15 AM Medical Record Number: [id number] Patient Account Number: [id number] Date of Birth/Sex: 12-17-1950 (66 y.o. Male) Treating RN: Montey Hora Primary Care Provider: PATIENT, NO Other Clinician: Referring Provider: Treating Provider/Extender: Frann Rider in Treatment: 3 Information Obtained from: Patient Chief Complaint Patient seen for complaints of Non-Healing Wound to both lower extremities with ulceration and swelling which has been going on for several months. 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 Male) Treating RN: Montey Hora Primary Care Provider: PATIENT, NO Other Clinician: Referring Provider: Treating Provider/Extender: Frann Rider in Treatment: 3 History of Present Illness Location: both lower extremities right worse than left Quality: Patient reports experiencing a dull pain to affected area(s). Severity: Patient states wound are getting worse. Duration: Patient has had the swelling for > 3 months prior to seeking treatment at the wound center. the blisters only arisen for the last week Timing: Pain in wound is Intermittent (comes and goes Context: The wound appeared gradually over time Modifying Factors: Other treatment(s) tried include:he has been put on oral doxycycline and has had Rocephin at the ER for 2 days Associated Signs and Symptoms: Patient reports having increase  swelling. HPI Description: 66 year old male who presented to the ER with bilateral lower extremity blisters which had started last week. he has a past medical history of leukemia, diabetes mellitus, hypertension, edema of both lower extremities, his recurrent skin infections, peripheral vascular disease, coronary artery disease, congestive heart failure and peripheral neuropathy. in the ER he was given Rocephin and put on Silvadene cream. he was put on oral doxycycline and was asked to follow-up with the [facility]. His last hemoglobin A1c was 6.6 in December and he checks his blood sugar once a week. He does not have any physicians outside the [place] system. He does not recall any vascular duplex studies done either for arterial or venous disease but was told to wear compression stockings which he does not use 05/30/2016 -- we have not yet received any of his notes from the [facility] hospital system and his arterial and venous duplex studies are scheduled here in [place] around mid February. We are unable to have his insurance accepted by home health agencies and hence he is getting dressings only once a week. 06/06/16 -- -- I received a call from the patient's PCP at the [facility] at [facility] and spoke to Dr. Garvin Fila, phone number [phone] and fax number [phone]. She confirmed that no vascular testing was done over the last 5 years and she would be happy to do them if the patient did want them to be done at the [place] and we could fax him a request. Electronic Signature(s) Signed: 06/13/2016 9:26:25 AM By: Christin Fudge MD, FACS Entered By: Christin Fudge on 06/13/2016 09:26:25 [name] ([id number]) -------------------------------------------------------------------------------- Physical Exam Details Patient Name: [name] Date of Service: 06/13/2016 9:15 AM Medical Record Number: [id number] Patient Account Number: [id number] Date of Birth/Sex: 1950/10/13 (66 y.o. Male) Treating RN: Montey Hora Primary Care Provider: PATIENT, NO Other Clinician: Referring Provider: Treating Provider/Extender: Frann Rider in Treatment: 3 Constitutional . Pulse regular. Respirations normal  and unlabored. Afebrile. . Eyes Nonicteric. Reactive to light. Ears, Nose, Mouth, and Throat Lips, teeth, and gums WNL.Marland Kitchen Moist mucosa without lesions. Neck supple and nontender. No palpable supraclavicular or cervical adenopathy. Normal sized without goiter. Respiratory WNL. No retractions.. Breath sounds WNL, No rubs, rales, rhonchi, or wheeze.. Cardiovascular Heart rhythm and rate regular, no murmur or gallop.. Pedal Pulses WNL. No clubbing, cyanosis or edema. Chest Breasts symmetical and no nipple discharge.. Breast tissue WNL, no masses, lumps, or tenderness.. Lymphatic No adneopathy. No adenopathy. No adenopathy. Musculoskeletal Adexa without tenderness or enlargement.. Digits and nails w/o clubbing, cyanosis, infection, petechiae, ischemia, or inflammatory conditions.. Integumentary (Hair, Skin) No suspicious lesions. No crepitus or fluctuance. No peri-wound warmth or erythema. No masses.Marland Kitchen Psychiatric Judgement and insight Intact.. No evidence of depression, anxiety, or agitation.. Notes the lymphedema has gone down significantly and his right and left leg are now completely healed Electronic Signature(s) Signed: 06/13/2016 9:26:47 AM By: Christin Fudge MD, FACS Entered By: Christin Fudge on 06/13/2016 09:26:47 [name] ([id number]) -------------------------------------------------------------------------------- Physician Orders Details Patient Name: [name] Date of Service: 06/13/2016 9:15 AM Medical Record Number: [id number] Patient Account Number: [id number] Date of Birth/Sex: 29-Apr-1951 (66 y.o. Male) Treating RN: Montey Hora Primary Care Provider: PATIENT, NO Other Clinician: Referring Provider: Treating Provider/Extender: Frann Rider in Treatment:  3 Verbal / Phone Orders: Yes Clinician: Montey Hora Read Back and Verified: Yes Diagnosis Coding Discharge From [facility] Services o Discharge from [place] wearing compression every day from morning until evening when you shower and go to bed Electronic Signature(s) Signed: 06/13/2016 4:07:26 PM By: Christin Fudge MD, FACS Signed: 06/13/2016 4:09:53 PM By: Montey Hora Entered By: Montey Hora on 06/13/2016 09:24:48 [name] ([id number]) -------------------------------------------------------------------------------- Problem List Details Patient Name: [name] Date of Service: 06/13/2016 9:15 AM Medical Record Number: [id number] Patient Account Number: [id number] Date of Birth/Sex: 05/18/1950 (67 y.o. Male) Treating RN: Montey Hora Primary Care Provider: PATIENT, NO Other Clinician: Referring Provider: Treating Provider/Extender: Frann Rider in Treatment: 3 Active Problems ICD-10 Encounter Code Description Active Date Diagnosis E11.622 Type 2 diabetes mellitus with other skin ulcer 05/23/2016 Yes I89.0 Lymphedema, not elsewhere classified 05/23/2016 Yes L97.211 Non-pressure chronic ulcer of right calf limited to 05/23/2016 Yes breakdown of skin L97.221 Non-pressure chronic ulcer of left calf limited to 05/23/2016 Yes breakdown of skin E66.01 Morbid (severe) obesity due to excess calories 05/23/2016 Yes Inactive Problems Resolved Problems Electronic Signature(s) Signed: 06/13/2016 9:26:11 AM By: Christin Fudge MD, FACS Entered By: Christin Fudge on 06/13/2016 09:26:11 [name] ([id number]) -------------------------------------------------------------------------------- Progress Note Details Patient Name: [name] Date of Service: 06/13/2016 9:15 AM Medical Record Number: [id number] Patient Account Number: [id number] Date of Birth/Sex: Oct 07, 1950 (66 y.o. Male) Treating RN: Montey Hora Primary Care Provider: PATIENT, NO Other  Clinician: Referring Provider: Treating Provider/Extender: Frann Rider in Treatment: 3 Subjective Chief Complaint Information obtained from Patient Patient seen for complaints of Non-Healing Wound to both lower extremities with ulceration and swelling which has been going on for several months. He broken into large blisters about a week ago History of Present Illness (HPI) The following HPI elements were documented for the patient's wound: Location: both lower extremities right worse than left Quality: Patient reports experiencing a dull pain to affected area(s). Severity: Patient states wound are getting worse. Duration: Patient has had the swelling for > 3 months prior to seeking treatment at the wound center. the blisters only arisen for the last week Timing: Pain in wound is Intermittent (comes and  goes Context: The wound appeared gradually over time Modifying Factors: Other treatment(s) tried include:he has been put on oral doxycycline and has had Rocephin at the ER for 2 days Associated Signs and Symptoms: Patient reports having increase swelling. 67 year old male who presented to the ER with bilateral lower extremity blisters which had started last week. he has a past medical history of leukemia, diabetes mellitus, hypertension, edema of both lower extremities, his recurrent skin infections, peripheral vascular disease, coronary artery disease, congestive heart failure and peripheral neuropathy. in the ER he was given Rocephin and put on Silvadene cream. he was put on oral doxycycline and was asked to follow-up with the [facility]. His last hemoglobin A1c was 6.6 in December and he checks his blood sugar once a week. He does not have any physicians outside the [place] system. He does not recall any vascular duplex studies done either for arterial or venous disease but was told to wear compression stockings which he does not use 05/30/2016 -- we have not yet received any of his  notes from the [facility] hospital system and his arterial and venous duplex studies are scheduled here in [place] around mid February. We are unable to have his insurance accepted by home health agencies and hence he is getting dressings only once a week. 06/06/16 -- -- I received a call from the patient's PCP at the [facility] at [facility] and spoke to Dr. Garvin Fila, phone number [phone] and fax number [phone]. She confirmed that no vascular testing was done over the last 5 years and she would be happy to do them if the patient did want them to be done at the [place] and we could fax him a request. [name], [name] ([id number]) Objective Constitutional Pulse regular. Respirations normal and unlabored. Afebrile. Vitals Time Taken: 9:08 AM, Height: 73 in, Weight: 326 lbs, BMI: 43, Temperature: 98.2 F, Pulse: 51 bpm, Respiratory Rate: 18 breaths/min, Blood Pressure: 131/51 mmHg. Eyes Nonicteric. Reactive to light. Ears, Nose, Mouth, and Throat Lips, teeth, and gums WNL.Marland Kitchen Moist mucosa without lesions. Neck supple and nontender. No palpable supraclavicular or cervical adenopathy. Normal sized without goiter. Respiratory WNL. No retractions.. Breath sounds WNL, No rubs, rales, rhonchi, or wheeze.. Cardiovascular Heart rhythm and rate regular, no murmur or gallop.. Pedal Pulses WNL. No clubbing, cyanosis or edema. Chest Breasts symmetical and no nipple discharge.. Breast tissue WNL, no masses, lumps, or tenderness.. Lymphatic No adneopathy. No adenopathy. No adenopathy. Musculoskeletal Adexa without tenderness or enlargement.. Digits and nails w/o clubbing, cyanosis, infection, petechiae, ischemia, or inflammatory conditions.Marland Kitchen Psychiatric Judgement and insight Intact.. No evidence of depression, anxiety, or agitation.. General Notes: the lymphedema has gone down significantly and his right and left leg are now completely healed Integumentary (Hair, Skin) No suspicious lesions. No crepitus or  fluctuance. No peri-wound warmth or erythema. No masses.. Wound #1 status is Open. Original cause of wound was Blister. The wound is located on the Right,Medial Lower Leg. The wound measures 0cm length x 0cm width x 0cm depth; 0cm^2 area and 0cm^3 volume. [name], [name] ([id number]) Assessment Active Problems ICD-10 E11.622 - Type 2 diabetes mellitus with other skin ulcer I89.0 - Lymphedema, not elsewhere classified L97.211 - Non-pressure chronic ulcer of right calf limited to breakdown of skin L97.221 - Non-pressure chronic ulcer of left calf limited to breakdown of skin E66.01 - Morbid (severe) obesity due to excess calories Plan Discharge From [facility] Services: Discharge from [place] wearing compression every day from morning until evening when you shower  and go to bed having healed his wound completely and his lymphedema is much better I have discharged him from the wound care services with the following advice: 1. he will wear compression stockings 20-30 mm variety on the lower extremity. He has some given by the [place] but we will order him a fresh pair. 2. Elevation and exercise 3. Adequate protein, vitamin A, vitamin C and zinc 4. arterial duplex study and venous reflux study for both lower extremities -- mid February at [facility]. I have asked him to keep these appointments and follow-up with the vascular surgeon as required 5. Return to the wound center only if his problems recur Electronic Signature(s) Signed: 06/13/2016 9:28:51 AM By: Christin Fudge MD, FACS Entered By: Christin Fudge on 06/13/2016 09:28:51 [name] ([id number]) -------------------------------------------------------------------------------- SuperBill Details Patient Name: [name] Date of Service: 06/13/2016 Medical Record Number: [id number] Patient Account Number: [id number] Date of Birth/Sex: 12/28/1950 (66 y.o. Male) Treating RN: Montey Hora Primary Care Provider: PATIENT, NO Other  Clinician: Referring Provider: Treating Provider/Extender: Frann Rider in Treatment: 3 Diagnosis Coding ICD-10 Codes Code Description E11.622 Type 2 diabetes mellitus with other skin ulcer I89.0 Lymphedema, not elsewhere classified L97.211 Non-pressure chronic ulcer of right calf limited to breakdown of skin L97.221 Non-pressure chronic ulcer of left calf limited to breakdown of skin E66.01 Morbid (severe) obesity due to excess calories Facility Procedures CPT4 Code: [id number] Description: 99213 - WOUND CARE VISIT-LEV 3 EST PT Modifier: Quantity: 1 Physician Procedures CPT4 Code Description: [id number] - WC PHYS LEVEL 2 - EST PT ICD-10 Description Diagnosis E11.622 Type 2 diabetes mellitus with other skin ulcer I89.0 Lymphedema, not elsewhere classified L97.211 Non-pressure chronic ulcer of right calf limited to  L97.221 Non-pressure chronic ulcer of left calf limited to Modifier: breakdown of sk breakdown of ski Quantity: 1 in n Electronic Signature(s) Signed: 06/13/2016 4:07:26 PM By: Christin Fudge MD, FACS Signed: 06/13/2016 4:09:53 PM By: Montey Hora Previous Signature: 06/13/2016 9:29:06 AM Version By: Christin Fudge MD, FACS Entered By: Montey Hora on 06/13/2016 09:30:01

## 2016-06-23 ENCOUNTER — Encounter (INDEPENDENT_AMBULATORY_CARE_PROVIDER_SITE_OTHER): Payer: PPO

## 2016-06-23 ENCOUNTER — Other Ambulatory Visit (INDEPENDENT_AMBULATORY_CARE_PROVIDER_SITE_OTHER): Payer: PPO

## 2016-06-23 ENCOUNTER — Other Ambulatory Visit: Payer: Self-pay | Admitting: Surgery

## 2016-06-23 ENCOUNTER — Encounter (INDEPENDENT_AMBULATORY_CARE_PROVIDER_SITE_OTHER): Payer: Self-pay

## 2016-06-23 DIAGNOSIS — L97221 Non-pressure chronic ulcer of left calf limited to breakdown of skin: Secondary | ICD-10-CM

## 2016-06-23 DIAGNOSIS — L97211 Non-pressure chronic ulcer of right calf limited to breakdown of skin: Secondary | ICD-10-CM

## 2016-06-23 DIAGNOSIS — I890 Lymphedema, not elsewhere classified: Secondary | ICD-10-CM

## 2016-07-07 ENCOUNTER — Other Ambulatory Visit (INDEPENDENT_AMBULATORY_CARE_PROVIDER_SITE_OTHER): Payer: PPO

## 2016-07-07 ENCOUNTER — Encounter (INDEPENDENT_AMBULATORY_CARE_PROVIDER_SITE_OTHER): Payer: Self-pay | Admitting: Vascular Surgery

## 2016-07-07 ENCOUNTER — Ambulatory Visit (INDEPENDENT_AMBULATORY_CARE_PROVIDER_SITE_OTHER): Payer: PPO | Admitting: Vascular Surgery

## 2016-07-07 DIAGNOSIS — I890 Lymphedema, not elsewhere classified: Secondary | ICD-10-CM | POA: Insufficient documentation

## 2016-07-07 DIAGNOSIS — E11620 Type 2 diabetes mellitus with diabetic dermatitis: Secondary | ICD-10-CM | POA: Diagnosis not present

## 2016-07-07 DIAGNOSIS — L97211 Non-pressure chronic ulcer of right calf limited to breakdown of skin: Secondary | ICD-10-CM

## 2016-07-07 DIAGNOSIS — E1129 Type 2 diabetes mellitus with other diabetic kidney complication: Secondary | ICD-10-CM | POA: Insufficient documentation

## 2016-07-07 DIAGNOSIS — E782 Mixed hyperlipidemia: Secondary | ICD-10-CM | POA: Diagnosis not present

## 2016-07-07 DIAGNOSIS — I872 Venous insufficiency (chronic) (peripheral): Secondary | ICD-10-CM | POA: Insufficient documentation

## 2016-07-07 DIAGNOSIS — L97221 Non-pressure chronic ulcer of left calf limited to breakdown of skin: Secondary | ICD-10-CM

## 2016-07-07 DIAGNOSIS — E785 Hyperlipidemia, unspecified: Secondary | ICD-10-CM | POA: Insufficient documentation

## 2016-07-07 DIAGNOSIS — I10 Essential (primary) hypertension: Secondary | ICD-10-CM | POA: Diagnosis not present

## 2016-07-07 DIAGNOSIS — E119 Type 2 diabetes mellitus without complications: Secondary | ICD-10-CM | POA: Insufficient documentation

## 2016-07-07 NOTE — Progress Notes (Signed)
MRN : [id number]  [name] is a 66 y.o. (Jun 14, 1950) male who presents with chief complaint of  Chief Complaint  Patient presents with  . New Evaluation    Venous and Arterial  .  History of Present Illness: Patient is seen for evaluation of leg pain and swelling associated with new onset ulceration bilateral ankles. The patient first noticed the swelling remotely. The swelling is associated with pain and discoloration. The pain and swelling worsens with prolonged dependency and improves with elevation. The pain is unrelated to activity.  The patient notes that in the morning the legs are better but the leg symptoms worsened throughout the course of the day. The patient has also noted a progressive worsening of the discoloration in the ankle and shin area.   The patient notes that an ulcer has developed acutely without specific trauma and since it occurred it has been very slow to heal.  There is a moderate amount of drainage associated with the open area.  The wound is also very painful.  The patient denies claudication symptoms or rest pain symptoms.  The patient denies DJD and LS spine disease.  The patient has not had any past angiography, interventions or vascular surgery.  Elevation makes the leg symptoms better, dependency makes them much worse. The patient denies any recent changes in medications.  The patient has not been wearing graduated compression.  The patient denies a history of DVT or PE. There is no prior history of phlebitis. There is no history of primary lymphedema.  No history of malignancies. No history of trauma or groin or pelvic surgery. There is no history of radiation treatment to the groin or pelvis    Current Meds  Medication Sig  . bisoprolol-hydrochlorothiazide (ZIAC) 10-6.25 MG tablet Take 1 tablet by mouth daily.  . furosemide (LASIX) 20 MG tablet Take 20 mg by mouth.  Marland Kitchen glimepiride (AMARYL) 4 MG tablet   . hydrALAZINE (APRESOLINE) 10 MG tablet  Take 10 mg by mouth 3 (three) times daily.  Marland Kitchen lisinopril-hydrochlorothiazide (PRINZIDE,ZESTORETIC) 20-12.5 MG tablet Take 1 tablet by mouth daily.  . metFORMIN (GLUCOPHAGE) 1000 MG tablet Take 1,000 mg by mouth 2 (two) times daily with a meal.  . pioglitazone (ACTOS) 15 MG tablet Take 15 mg by mouth daily.  . pravastatin (PRAVACHOL) 80 MG tablet Take 80 mg by mouth daily.    Past Medical History:  Diagnosis Date  . Diabetes mellitus without complication ([place])   . Hyperlipidemia   . Hypertension   . Leukemia ([place])     No past surgical history on file.  Social History Social History  Substance Use Topics  . Smoking status: Former Research scientist (life sciences)  . Smokeless tobacco: Never Used  . Alcohol use No    Family History No family history on file. No family history of bleeding/clotting disorders, porphyria or autoimmune disease   No Known Allergies   REVIEW OF SYSTEMS (Negative unless checked)  Constitutional: [] Weight loss  [] Fever  [] Chills Cardiac: [] Chest pain   [] Chest pressure   [] Palpitations   [] Shortness of breath when laying flat   [] Shortness of breath with exertion. Vascular:  [] Pain in legs with walking   [x] Pain in legs with standing  [] History of DVT   [] Phlebitis   [x] Swelling in legs   [x] Varicose veins   [x] Non-healing ulcers Pulmonary:   [] Uses home oxygen   [] Productive cough   [] Hemoptysis   [] Wheeze  [] COPD   [] Asthma Neurologic:  [] Dizziness   [] Seizures   []   History of stroke   [] History of TIA  [] Aphasia   [] Vissual changes   [] Weakness or numbness in arm   [] Weakness or numbness in leg Musculoskeletal:   [] Joint swelling   [x] Joint pain   [] Low back pain Hematologic:  [] Easy bruising  [] Easy bleeding   [] Hypercoagulable state   [] Anemic Gastrointestinal:  [] Diarrhea   [] Vomiting  [] Gastroesophageal reflux/heartburn   [] Difficulty swallowing. Genitourinary:  [] Chronic kidney disease   [] Difficult urination  [] Frequent urination   [] Blood in urine Skin:  [] Rashes    [] Ulcers  Psychological:  [] History of anxiety   []  History of major depression.  Physical Examination  Vitals:   07/07/16 1503  BP: (!) 186/78  Pulse: 65  Resp: 18  Weight: (!) 321 lb (145.6 kg)  Height: 6\' 1"  (1.854 m)   Body mass index is 42.35 kg/m. Gen: WD/WN, NAD Head: [place]/AT, No temporalis wasting.  Ear/Nose/Throat: Hearing grossly intact, nares w/o erythema or drainage, poor dentition Eyes: PER, EOMI, sclera nonicteric.  Neck: Supple, no masses.  No bruit or JVD.  Pulmonary:  Good air movement, clear to auscultation bilaterally, no use of accessory muscles.  Cardiac: RRR, normal S1, S2, no Murmurs. Vascular: 2-3+ edema bilaterally with severe venous changes bilaterally.  Venous ulcers noted in the ankle area bilaterally, noninfected Vessel Right Left  Radial Palpable Palpable  Ulnar Palpable Palpable  Brachial Palpable Palpable  Carotid Palpable Palpable  Femoral Palpable Palpable  Popliteal Palpable Palpable  PT Palpable Palpable  DP Palpable Palpable   Gastrointestinal: soft, non-distended. No guarding/no peritoneal signs.  Musculoskeletal: M/S 5/5 throughout.  No deformity or atrophy.  Neurologic: CN 2-12 intact. Pain and light touch intact in extremities.  Symmetrical.  Speech is fluent. Motor exam as listed above. Psychiatric: Judgment intact, Mood & affect appropriate for pt's clinical situation. Dermatologic: venous rashes with ulcers noted bilaterally.  No changes consistent with cellulitis. Lymph : No Cervical lymphadenopathy, no lichenification or skin changes of chronic lymphedema.  CBC No results found for: WBC, HGB, HCT, MCV, PLT  BMET No results found for: NA, K, CL, CO2, GLUCOSE, BUN, CREATININE, CALCIUM, GFRNONAA, GFRAA CrCl cannot be calculated (No order found.).  COAG No results found for: INR, PROTIME  Radiology No results found.  Assessment/Plan 1. Chronic venous insufficiency I have had a long discussion with the patient regarding  swelling and why it  causes symptoms.  Patient will begin wearing graduated compression stockings class 1 (20-30 mmHg) on a daily basis a prescription was given. The patient will  beginning wearing the stockings first thing in the morning and removing them in the evening. The patient is instructed specifically not to sleep in the stockings.   In addition, behavioral modification will be initiated.  This will include frequent elevation, use of over the counter pain medications and exercise such as walking.  I have reviewed systemic causes for chronic edema such as liver, kidney and cardiac etiologies.  The patient denies problems with these organ systems.    Consideration for a lymph pump will also be made based upon the effectiveness of conservative therapy.  This would help to improve the edema control and prevent sequela such as ulcers and infections   Patient should undergo duplex ultrasound of the venous system to ensure that DVT or reflux is not present.  The patient will follow-up with me after the ultrasound.   2. Venous stasis dermatitis of both lower extremities See #1  3. Lymphedema See #1  4. Type 2 diabetes mellitus with  diabetic dermatitis, without long-term current use of insulin ([place]) Continue hypoglycemic medications as already ordered, these medications have been reviewed and there are no changes at this time.  Hgb A1C to be monitored as already arranged by primary service  5. Essential hypertension Continue antihypertensive medications as already ordered, these medications have been reviewed and there are no changes at this time.  6. Mixed hyperlipidemia Continue statin as ordered and reviewed, no changes at this time   Hortencia Pilar, MD  07/07/2016 10:07 PM

## 2017-01-12 ENCOUNTER — Ambulatory Visit (INDEPENDENT_AMBULATORY_CARE_PROVIDER_SITE_OTHER): Payer: PPO | Admitting: Vascular Surgery

## 2017-01-27 ENCOUNTER — Telehealth (INDEPENDENT_AMBULATORY_CARE_PROVIDER_SITE_OTHER): Payer: Self-pay | Admitting: Vascular Surgery

## 2017-01-27 ENCOUNTER — Encounter: Payer: PPO | Attending: Surgery | Admitting: Surgery

## 2017-01-27 DIAGNOSIS — E11622 Type 2 diabetes mellitus with other skin ulcer: Secondary | ICD-10-CM | POA: Diagnosis not present

## 2017-01-27 DIAGNOSIS — L97221 Non-pressure chronic ulcer of left calf limited to breakdown of skin: Secondary | ICD-10-CM | POA: Insufficient documentation

## 2017-01-27 DIAGNOSIS — I87312 Chronic venous hypertension (idiopathic) with ulcer of left lower extremity: Secondary | ICD-10-CM | POA: Insufficient documentation

## 2017-01-27 DIAGNOSIS — L97821 Non-pressure chronic ulcer of other part of left lower leg limited to breakdown of skin: Secondary | ICD-10-CM | POA: Diagnosis not present

## 2017-01-27 DIAGNOSIS — I890 Lymphedema, not elsewhere classified: Secondary | ICD-10-CM | POA: Insufficient documentation

## 2017-01-27 DIAGNOSIS — I87303 Chronic venous hypertension (idiopathic) without complications of bilateral lower extremity: Secondary | ICD-10-CM | POA: Diagnosis not present

## 2017-02-03 ENCOUNTER — Ambulatory Visit: Payer: PPO | Admitting: Physician Assistant

## 2017-02-03 ENCOUNTER — Encounter: Payer: PPO | Admitting: Physician Assistant

## 2017-02-03 DIAGNOSIS — E11622 Type 2 diabetes mellitus with other skin ulcer: Secondary | ICD-10-CM | POA: Diagnosis not present

## 2017-02-03 DIAGNOSIS — L97221 Non-pressure chronic ulcer of left calf limited to breakdown of skin: Secondary | ICD-10-CM | POA: Diagnosis not present

## 2017-02-06 NOTE — Progress Notes (Signed)
[name], [name] ([id number]) Visit Report for 02/03/2017 Arrival Information Details Patient Name: [name], [name] Date of Service: 02/03/2017 8:45 AM Medical Record Number: [id number] Patient Account Number: [id number] Date of Birth/Sex: 03-24-1951 (66 y.o. Male) Treating RN: Ahmed Prima Primary Care Jocelyn Nold: PATIENT, NO Other Clinician: Referring Trish Mancinelli: Referral, Self Treating Laurie Penado/Extender: STONE III, [name] Weeks in Treatment: 1 Visit Information History Since Last Visit All ordered tests and consults were completed: No Patient Arrived: Ambulatory Added or deleted any medications: No Arrival Time: 08:48 Any new allergies or adverse reactions: No Accompanied By: self Had a fall or experienced change in No Transfer Assistance: None activities of daily living that may affect Patient Identification Verified: Yes risk of falls: Secondary Verification Process Yes Signs or symptoms of abuse/neglect since last No Completed: visito Patient Requires Transmission-Based No Hospitalized since last visit: No Precautions: Has Dressing in Place as Prescribed: Yes Patient Has Alerts: No Has Compression in Place as Prescribed: Yes Pain Present Now: No Electronic Signature(s) Signed: 02/06/2017 9:42:14 AM By: Alric Quan Entered By: Alric Quan on 02/03/2017 08:49:07 [name] ([id number]) -------------------------------------------------------------------------------- Encounter Discharge Information Details Patient Name: [name] Date of Service: 02/03/2017 8:45 AM Medical Record Number: [id number] Patient Account Number: [id number] Date of Birth/Sex: 08/16/1950 (66 y.o. Male) Treating RN: Ahmed Prima Primary Care Damon Hargrove: PATIENT, NO Other Clinician: Referring Kasyn Stouffer: Referral, Self Treating Eufemio Strahm/Extender: STONE III, [name] Weeks in Treatment: 1 Encounter Discharge Information Items Discharge Pain Level: 0 Discharge Condition: Stable Ambulatory  Status: Ambulatory Discharge Destination: Home Transportation: Private Auto Accompanied By: self Schedule Follow-up Appointment: Yes Medication Reconciliation completed and provided to Patient/Care No Burgundy Matuszak: Provided on Clinical Summary of Care: 02/03/2017 Form Type Recipient Paper Patient DT Electronic Signature(s) Signed: 02/06/2017 9:00:05 AM By: Ruthine Dose Entered By: Ruthine Dose on 02/03/2017 09:31:28 [name] ([id number]) -------------------------------------------------------------------------------- Lower Extremity Assessment Details Patient Name: [name] Date of Service: 02/03/2017 8:45 AM Medical Record Number: [id number] Patient Account Number: [id number] Date of Birth/Sex: 1950/07/19 (66 y.o. Male) Treating RN: Ahmed Prima Primary Care Navarre Diana: PATIENT, NO Other Clinician: Referring Lawren Sexson: Referral, Self Treating [place] Steidle/Extender: STONE III, [name] Weeks in Treatment: 1 Edema Assessment Assessed: [Left: No] [Right: No] E[Left: dema] [Right: :] Calf Left: Right: Point of Measurement: 36 cm From Medial Instep 41.6 cm cm Ankle Left: Right: Point of Measurement: 11 cm From Medial Instep 27 cm cm Vascular Assessment Pulses: Dorsalis Pedis Palpable: [Left:Yes] Posterior Tibial Extremity colors, hair growth, and conditions: Extremity Color: [Left:Hyperpigmented] Temperature of Extremity: [Left:Warm] Capillary Refill: [Left:< 3 seconds] Toe Nail Assessment Left: Right: Thick: No Discolored: No Deformed: No Improper Length and Hygiene: No Electronic Signature(s) Signed: 02/06/2017 9:42:14 AM By: Alric Quan Entered By: Alric Quan on 02/03/2017 08:59:01 [name] ([id number]) -------------------------------------------------------------------------------- Multi Wound Chart Details Patient Name: [name] Date of Service: 02/03/2017 8:45 AM Medical Record Number: [id number] Patient Account Number: [id number] Date of  Birth/Sex: 09/22/1950 (66 y.o. Male) Treating RN: Ahmed Prima Primary Care Marney Treloar: PATIENT, NO Other Clinician: Referring Jayshaun Phillips: Referral, Self Treating Juan Kissoon/Extender: STONE III, [name] Weeks in Treatment: 1 Vital Signs Height(in): 73 Pulse(bpm): 50 Weight(lbs): 331.7 Blood Pressure 134/59 (mmHg): Body Mass Index(BMI): 44 Temperature(F): 98.1 Respiratory Rate 20 (breaths/min): Photos: [3:No Photos] [N/A:N/A] Wound Location: [3:Left Lower Leg - Medial] [N/A:N/A] Wounding Event: [3:Blister] [N/A:N/A] Primary Etiology: [3:Diabetic Wound/Ulcer of the Lower Extremity] [N/A:N/A] Comorbid History: [3:Hypertension, Type II Diabetes, Neuropathy, Received Chemotherapy] [N/A:N/A] Date Acquired: [3:01/24/2017] [N/A:N/A] Weeks of Treatment: [3:1] [N/A:N/A] Wound Status: [3:Open] [N/A:N/A] Clustered Wound: [3:Yes] [N/A:N/A] Clustered Quantity: [3:2] [N/A:N/A] Measurements L x W  x D 10.5x7x0.1 [N/A:N/A] (cm) Area (cm) : [3:57.727] [N/A:N/A] Volume (cm) : [3:5.773] [N/A:N/A] % Reduction in Area: [3:-50.80%] [N/A:N/A] % Reduction in Volume: -50.80% [N/A:N/A] Classification: [3:Grade 1] [N/A:N/A] Exudate Amount: [3:Large] [N/A:N/A] Exudate Type: [3:Serous] [N/A:N/A] Exudate Color: [3:amber] [N/A:N/A] Wound Margin: [3:Distinct, outline attached] [N/A:N/A] Granulation Amount: [3:Small (1-33%)] [N/A:N/A] Granulation Quality: [3:Red] [N/A:N/A] Necrotic Amount: [3:Large (67-100%)] [N/A:N/A] Necrotic Tissue: [3:Eschar, Adherent Slough] [N/A:N/A] Epithelialization: [3:None] [N/A:N/A] Periwound Skin Texture: No Abnormalities Noted N/A N/A Periwound Skin No Abnormalities Noted N/A N/A Moisture: Periwound Skin Color: No Abnormalities Noted N/A N/A Temperature: No Abnormality N/A N/A Tenderness on Yes N/A N/A Palpation: Wound Preparation: Ulcer Cleansing: N/A N/A Rinsed/Irrigated with Saline Topical Anesthetic Applied: Other: lidocaine 4% Treatment Notes Electronic  Signature(s) Signed: 02/06/2017 9:42:14 AM By: Alric Quan Entered By: Alric Quan on 02/03/2017 09:00:25 [name] ([id number]) -------------------------------------------------------------------------------- [place] Details Patient Name: [name] Date of Service: 02/03/2017 8:45 AM Medical Record Number: [id number] Patient Account Number: [id number] Date of Birth/Sex: Nov 09, 1950 (66 y.o. Male) Treating RN: Ahmed Prima Primary Care Haidee Stogsdill: PATIENT, NO Other Clinician: Referring Kastiel Simonian: Referral, Self Treating Jillyn Stacey/Extender: STONE III, [name] Weeks in Treatment: 1 Active Inactive ` Orientation to the Wound Care Program Nursing Diagnoses: Knowledge deficit related to the wound healing center program Goals: Patient/caregiver will verbalize understanding of the [facility] Date Initiated: 01/27/2017 Target Resolution Date: 02/11/2017 Goal Status: Active Interventions: Provide education on orientation to the wound center Notes: ` Pain, Acute or Chronic Nursing Diagnoses: Pain, acute or chronic: actual or potential Potential alteration in comfort, pain Goals: Patient/caregiver will verbalize adequate pain control between visits Date Initiated: 01/27/2017 Target Resolution Date: 05/13/2017 Goal Status: Active Interventions: Complete pain assessment as per visit requirements Notes: ` Wound/Skin Impairment Nursing Diagnoses: Impaired tissue integrity [name], [name] ([id number]) Knowledge deficit related to ulceration/compromised skin integrity Goals: Ulcer/skin breakdown will have a volume reduction of 80% by week 12 Date Initiated: 01/27/2017 Target Resolution Date: 05/06/2017 Goal Status: Active Interventions: Assess patient/caregiver ability to perform ulcer/skin care regimen upon admission and as needed Notes: Electronic Signature(s) Signed: 02/06/2017 9:42:14 AM By: Alric Quan Entered By: Alric Quan on 02/03/2017 09:00:20 [name] ([id number]) -------------------------------------------------------------------------------- Pain Assessment Details Patient Name: [name] Date of Service: 02/03/2017 8:45 AM Medical Record Number: [id number] Patient Account Number: [id number] Date of Birth/Sex: Sep 29, 1950 (66 y.o. Male) Treating RN: Ahmed Prima Primary Care Meaghen Vecchiarelli: PATIENT, NO Other Clinician: Referring Duilio Heritage: Referral, Self Treating Glendell Schlottman/Extender: STONE III, [name] Weeks in Treatment: 1 Active Problems Location of Pain Severity and Description of Pain Patient Has Paino No Site Locations Pain Management and Medication Current Pain Management: Electronic Signature(s) Signed: 02/06/2017 9:42:14 AM By: Alric Quan Entered By: Alric Quan on 02/03/2017 08:49:12 [name] ([id number]) -------------------------------------------------------------------------------- Patient/Caregiver Education Details Patient Name: [name] Date of Service: 02/03/2017 8:45 AM Medical Record Number: [id number] Patient Account Number: [id number] Date of Birth/Gender: [birth year] (66 y.o. Male) Treating RN: Ahmed Prima Primary Care Physician: PATIENT, NO Other Clinician: Referring Physician: Referral, Self Treating Physician/Extender: Melburn Hake, [name] Weeks in Treatment: 1 Education Assessment Education Provided To: Patient Education Topics Provided Wound/Skin Impairment: Handouts: Other: do not get wrap wet Methods: Demonstration, Explain/Verbal Responses: State content correctly Electronic Signature(s) Signed: 02/06/2017 9:42:14 AM By: Alric Quan Entered By: Alric Quan on 02/03/2017 09:14:53 [name] ([id number]) -------------------------------------------------------------------------------- Wound Assessment Details Patient Name: [name] Date of Service: 02/03/2017 8:45 AM Medical Record Number: [id number] Patient Account  Number: [id number] Date of Birth/Sex: 19-Jul-1950 (66 y.o. Male) Treating RN: Ahmed Prima Primary Care  Abrahan Fulmore: PATIENT, NO Other Clinician: Referring Arjay Jaskiewicz: Referral, Self Treating Zayd Bonet/Extender: STONE III, [name] Weeks in Treatment: 1 Wound Status Wound Number: 3 Primary Diabetic Wound/Ulcer of the Lower Etiology: Extremity Wound Location: Left Lower Leg - Medial Wound Open Wounding Event: Blister Status: Date Acquired: 01/24/2017 Comorbid Hypertension, Type II Diabetes, Weeks Of Treatment: 1 History: Neuropathy, Received Chemotherapy Clustered Wound: Yes Photos Photo Uploaded By: Alric Quan on 02/03/2017 11:50:30 Wound Measurements Length: (cm) 10.5 % Reduction i Width: (cm) 7 % Reduction i Depth: (cm) 0.1 Epithelializa Clustered Quantity: 2 Tunneling: Area: (cm) 57.727 Undermining: Volume: (cm) 5.773 n Area: -50.8% n Volume: -50.8% tion: None No No Wound Description Classification: Grade 1 Foul Odor Aft Wound Margin: Distinct, outline attached Slough/Fibrin Exudate Amount: Large Exudate Type: Serous Exudate Color: amber er Cleansing: No o Yes Wound Bed Granulation Amount: Small (1-33%) Granulation Quality: Red Necrotic Amount: Large (67-100%) [name], [name] ([id number]) Necrotic Quality: Eschar, Adherent Slough Periwound Skin Texture Texture Color No Abnormalities Noted: No No Abnormalities Noted: No Moisture Temperature / Pain No Abnormalities Noted: No Temperature: No Abnormality Tenderness on Palpation: Yes Wound Preparation Ulcer Cleansing: Rinsed/Irrigated with Saline Topical Anesthetic Applied: Other: lidocaine 4%, Treatment Notes Wound #3 (Left, Medial Lower Leg) 1. Cleansed with: Clean wound with Normal Saline Cleanse wound with antibacterial soap and water 2. Anesthetic Topical Lidocaine 4% cream to wound bed prior to debridement 4. Dressing Applied: Other dressing (specify in notes) 5. Secondary Dressing Applied ABD  Pad 7. Secured with Tape 3 Layer Compression System - Left Lower Extremity Notes unna to anchor, xtrasorb, silvercel Electronic Signature(s) Signed: 02/06/2017 9:42:14 AM By: Alric Quan Entered By: Alric Quan on 02/03/2017 08:56:14 [name] ([id number]) -------------------------------------------------------------------------------- Vitals Details Patient Name: [name] Date of Service: 02/03/2017 8:45 AM Medical Record Number: [id number] Patient Account Number: [id number] Date of Birth/Sex: Sep 07, 1950 (66 y.o. Male) Treating RN: Ahmed Prima Primary Care Morrigan Wickens: PATIENT, NO Other Clinician: Referring Manisha Cancel: Referral, Self Treating Kelty Szafran/Extender: STONE III, [name] Weeks in Treatment: 1 Vital Signs Time Taken: 08:56 Temperature (F): 98.1 Height (in): 73 Pulse (bpm): 50 Weight (lbs): 331.7 Respiratory Rate (breaths/min): 20 Body Mass Index (BMI): 43.8 Blood Pressure (mmHg): 134/59 Reference Range: 80 - 120 mg / dl Electronic Signature(s) Signed: 02/06/2017 9:42:14 AM By: Alric Quan Entered By: Alric Quan on 02/03/2017 08:57:24

## 2017-02-06 NOTE — Progress Notes (Signed)
MAHLIK, LENN ([id number]) Visit Report for 02/03/2017 Chief Complaint Document Details Patient Name: ONEAL, SCHOENBERGER Date of Service: 02/03/2017 8:45 AM Medical Record Number: [id number] Patient Account Number: [id number] Date of Birth/Sex: [birth year] (67 y.o. Male) Treating RN: Ahmed Prima Primary Care Provider: PATIENT, NO Other Clinician: Referring Provider: Referral, Self Treating Provider/Extender: STONE III, [name] Weeks in Treatment: 1 Information Obtained from: Patient Chief Complaint this patient is returning to [place] after having recurrence of left lower extremity blisters Electronic Signature(s) Signed: 02/03/2017 5:13:37 PM By: Worthy Keeler PA-C Entered By: Worthy Keeler on 02/03/2017 09:24:33 [name] ([id number]) -------------------------------------------------------------------------------- Debridement Details Patient Name: [name] Date of Service: 02/03/2017 8:45 AM Medical Record Number: [id number] Patient Account Number: [id number] Date of Birth/Sex: 08-Feb-1951 (66 y.o. Male) Treating RN: Ahmed Prima Primary Care Provider: PATIENT, NO Other Clinician: Referring Provider: Referral, Self Treating Provider/Extender: STONE III, [name] Weeks in Treatment: 1 Debridement Performed for Wound #3 Left,Medial Lower Leg Assessment: Performed By: Physician STONE III, [name] E., PA-C Debridement: Open Wound/Selective Severity of Tissue Pre Fat layer exposed Debridement: Debridement Selective Description: Pre-procedure Verification/Time Out Yes - 09:14 Taken: Start Time: 09:15 Pain Control: Lidocaine 4% Topical Solution Level: Non-Viable Tissue Total Area Debrided (L x 10.5 (cm) x 7 (cm) = 73.5 (cm) W): Tissue and other Viable, Non-Viable, Exudate, Fibrin/Slough material debrided: Instrument: Forceps, Scissors Bleeding: None End Time: 09:19 Procedural Pain: 0 Post Procedural Pain: 0 Response to Treatment: Procedure was tolerated well Post  Debridement Measurements of Total Wound Length: (cm) 10.5 Width: (cm) 7 Depth: (cm) 0.2 Volume: (cm) 11.545 Character of Wound/Ulcer Post Requires Further Debridement Debridement: Severity of Tissue Post Debridement: Fat layer exposed Post Procedure Diagnosis Same as Pre-procedure Electronic Signature(s) Signed: 02/03/2017 5:13:37 PM By: Irean Hong [place], [place] ([id number]) Signed: 02/06/2017 9:42:14 AM By: Alric Quan Entered By: Alric Quan on 02/03/2017 09:20:08 [name] ([id number]) -------------------------------------------------------------------------------- HPI Details Patient Name: [name] Date of Service: 02/03/2017 8:45 AM Medical Record Number: [id number] Patient Account Number: [id number] Date of Birth/Sex: 1950-07-23 (66 y.o. Male) Treating RN: Ahmed Prima Primary Care Provider: PATIENT, NO Other Clinician: Referring Provider: Referral, Self Treating Provider/Extender: STONE III, [name] Weeks in Treatment: 1 History of Present Illness Location: left lower extremity Quality: Patient reports experiencing a dull pain to affected area(s). Severity: Patient states wound are getting worse. Duration: Patient has had the swelling for > 5 days prior to seeking treatment at the wound center. the blisters only arisen for the last week Timing: Pain in wound is Intermittent (comes and goes Context: The wound appeared gradually over time Modifying Factors: Other treatment(s) tried include:he has been put on oral doxycycline and has had Rocephin at the ER for 2 days Associated Signs and Symptoms: Patient reports having increase swelling. HPI Description: 66 year old male seen by as in February of this year and was referred to vein and vascular for studies and opinion from the vascular surgeons. The patient returns today with a fresh problem having had blisters on his left lower extremity which have been there for about 5 days and he clearly  states that he has been wearing his compression stockings as advised though he could not read the moderate compression and has been wearing light compression. Review of his electronic medical records note that he had lower extremity arterial duplex examination done on 06/23/2016 which showed no hemodynamically significant stenosis in the bilateral lower extremity arterial system. He also had a lower extremity venous reflux examination done on 07/07/2016 and it was noted that he  had venous incompetence in the right great saphenous vein and bilateral common femoral veins. Patient was seen by Dr. Tamala Julian on the same day and for some reason his notes do not reflect the venous studies or the arterial studies and he recommended patient do a venous duplex ultrasound to look for reflux and return to see him.he would also consider a lymph pump if required. The patient was told that his workup was normal and hence the patient canceled his follow-up appointment. 02/03/17 on evaluation today patient left medial lower extremity blister appears to be doing about the same. It is still continuing to drain and there's still the blistered skin covering the wound bed which is making it difficult for the alternate to do its job. Fortunately there is no evidence of cellulitis. No fevers chills noted. Patient states in general he is not having any significant discomfort. Patient's lower extremity arterial duplex exam revealed that patient was hemodynamically stable with no evidence of stenosis in regard to the bilateral lower extremities. The lower extremity venous reflux exam revealed the patient had venous incontinence noted in the right greater saphenous and bilateral common femoral vein. There is no evidence of deep or superficial vein thrombosis in the bilateral lower extremities. ====== Old notes SHIVANK, PINEDO ([id number]) 66 year old male who presented to the ER with bilateral lower extremity blisters which had  started last week. he has a past medical history of leukemia, diabetes mellitus, hypertension, edema of both lower extremities, his recurrent skin infections, peripheral vascular disease, coronary artery disease, congestive heart failure and peripheral neuropathy. in the ER he was given Rocephin and put on Silvadene cream. he was put on oral doxycycline and was asked to follow-up with the [facility]. His last hemoglobin A1c was 6.6 in December and he checks his blood sugar once a week. He does not have any physicians outside the [place] system. He does not recall any vascular duplex studies done either for arterial or venous disease but was told to wear compression stockings which he does not use 05/30/2016 -- we have not yet received any of his notes from the [facility] hospital system and his arterial and venous duplex studies are scheduled here in [place] around mid February. We are unable to have his insurance accepted by home health agencies and hence he is getting dressings only once a week. 06/06/16 -- -- I received a call from the patient's PCP at the [facility] at [facility] and spoke to Dr. Garvin Fila, phone number [phone] and fax number [phone]. She confirmed that no vascular testing was done over the last 5 years and she would be happy to do them if the patient did want them to be done at the [place] and we could fax him a request. ============ Electronic Signature(s) Signed: 02/03/2017 5:13:37 PM By: Worthy Keeler PA-C Entered By: Worthy Keeler on 02/03/2017 10:21:50 [name] ([id number]) -------------------------------------------------------------------------------- Physical Exam Details Patient Name: [name] Date of Service: 02/03/2017 8:45 AM Medical Record Number: [id number] Patient Account Number: [id number] Date of Birth/Sex: Mar 28, 1951 (66 y.o. Male) Treating RN: Ahmed Prima Primary Care Provider: PATIENT, NO Other Clinician: Referring Provider: Referral,  Self Treating Provider/Extender: STONE III, [name] Weeks in Treatment: 1 Constitutional Obese and well-hydrated in no acute distress. Respiratory normal breathing without difficulty. clear to auscultation bilaterally. Cardiovascular regular rate and rhythm with normal S1, S2. Psychiatric this patient is able to make decisions and demonstrates good insight into disease process. Alert and Oriented x 3. pleasant and cooperative. Notes Patient did  have a layer of blistered skin which was necrotic and starting to slough off still covering the wound bed and the wound itself was very macerated due to trapping mainly serous fluid. This did require selective debridement today which patient tolerated very well without complication no pain was noted during the procedure. Electronic Signature(s) Signed: 02/03/2017 5:13:37 PM By: Worthy Keeler PA-C Entered By: Worthy Keeler on 02/03/2017 09:34:44 [name] ([id number]) -------------------------------------------------------------------------------- Physician Orders Details Patient Name: [name] Date of Service: 02/03/2017 8:45 AM Medical Record Number: [id number] Patient Account Number: [id number] Date of Birth/Sex: [birth year] (66 y.o. Male) Treating RN: Ahmed Prima Primary Care Provider: PATIENT, NO Other Clinician: Referring Provider: Referral, Self Treating Provider/Extender: STONE III, [name] Weeks in Treatment: 1 Verbal / Phone Orders: Yes Clinician: Pinkerton, Debi Read Back and Verified: Yes Diagnosis Coding Wound Cleansing Wound #3 Left,Medial Lower Leg o Clean wound with Normal Saline. o Cleanse wound with mild soap and water Anesthetic Wound #3 Left,Medial Lower Leg o Topical Lidocaine 4% cream applied to wound bed prior to debridement Primary Wound Dressing Wound #3 Left,Medial Lower Leg o Silvercel Non-Adherent Secondary Dressing Wound #3 Left,Medial Lower Leg o ABD pad o XtraSorb Dressing Change  Frequency Wound #3 Left,Medial Lower Leg o Change dressing every week Follow-up Appointments Wound #3 Left,Medial Lower Leg o Return Appointment in 1 week. Edema Control Wound #3 Left,Medial Lower Leg o 3 Layer Compression System - Left Lower Extremity - unna to anchor o Elevate legs to the level of the heart and pump ankles as often as possible Additional Orders / Instructions Wound #3 Left,Medial Lower Leg o Increase protein intake. BRIGG, CAPE ([id number]) Electronic Signature(s) Signed: 02/03/2017 5:13:37 PM By: Worthy Keeler PA-C Signed: 02/06/2017 9:42:14 AM By: Alric Quan Entered By: Alric Quan on 02/03/2017 09:20:32 [name] ([id number]) -------------------------------------------------------------------------------- Problem List Details Patient Name: [name] Date of Service: 02/03/2017 8:45 AM Medical Record Number: [id number] Patient Account Number: [id number] Date of Birth/Sex: April 20, 1951 (66 y.o. Male) Treating RN: Ahmed Prima Primary Care Provider: PATIENT, NO Other Clinician: Referring Provider: Referral, Self Treating Provider/Extender: Melburn Hake, [name] Weeks in Treatment: 1 Active Problems ICD-10 Encounter Code Description Active Date Diagnosis E11.622 Type 2 diabetes mellitus with other skin ulcer 01/27/2017 Yes L97.221 Non-pressure chronic ulcer of left calf limited to 01/27/2017 Yes breakdown of skin I87.312 Chronic venous hypertension (idiopathic) with ulcer of left 01/27/2017 Yes lower extremity I89.0 Lymphedema, not elsewhere classified 01/27/2017 Yes E66.01 Morbid (severe) obesity due to excess calories 01/27/2017 Yes Inactive Problems Resolved Problems Electronic Signature(s) Signed: 02/03/2017 5:13:37 PM By: Worthy Keeler PA-C Entered By: Worthy Keeler on 02/03/2017 09:24:14 [name] ([id number]) -------------------------------------------------------------------------------- Progress Note Details Patient  Name: [name] Date of Service: 02/03/2017 8:45 AM Medical Record Number: [id number] Patient Account Number: [id number] Date of Birth/Sex: [birth year] (66 y.o. Male) Treating RN: Ahmed Prima Primary Care Provider: PATIENT, NO Other Clinician: Referring Provider: Referral, Self Treating Provider/Extender: STONE III, [name] Weeks in Treatment: 1 Subjective Chief Complaint Information obtained from Patient this patient is returning to [place] after having recurrence of left lower extremity blisters History of Present Illness (HPI) The following HPI elements were documented for the patient's wound: Location: left lower extremity Quality: Patient reports experiencing a dull pain to affected area(s). Severity: Patient states wound are getting worse. Duration: Patient has had the swelling for > 5 days prior to seeking treatment at the wound center. the blisters only arisen for the last week Timing: Pain in wound is Intermittent (comes and goes  Context: The wound appeared gradually over time Modifying Factors: Other treatment(s) tried include:he has been put on oral doxycycline and has had Rocephin at the ER for 2 days Associated Signs and Symptoms: Patient reports having increase swelling. 66 year old male seen by as in February of this year and was referred to vein and vascular for studies and opinion from the vascular surgeons. The patient returns today with a fresh problem having had blisters on his left lower extremity which have been there for about 5 days and he clearly states that he has been wearing his compression stockings as advised though he could not read the moderate compression and has been wearing light compression. Review of his electronic medical records note that he had lower extremity arterial duplex examination done on 06/23/2016 which showed no hemodynamically significant stenosis in the bilateral lower extremity arterial system. He also had a lower extremity venous  reflux examination done on 07/07/2016 and it was noted that he had venous incompetence in the right great saphenous vein and bilateral common femoral veins. Patient was seen by Dr. Tamala Julian on the same day and for some reason his notes do not reflect the venous studies or the arterial studies and he recommended patient do a venous duplex ultrasound to look for reflux and return to see him.he would also consider a lymph pump if required. The patient was told that his workup was normal and hence the patient canceled his follow-up appointment. 02/03/17 on evaluation today patient left medial lower extremity blister appears to be doing about the same. It is still continuing to drain and there's still the blistered skin covering the wound bed which is making it difficult for the alternate to do its job. Fortunately there is no evidence of cellulitis. No fevers chills noted. Patient states in general he is not having any significant discomfort. Patient's lower extremity arterial duplex exam revealed that patient was hemodynamically stable with no [place]kom, [name] ([id number]) evidence of stenosis in regard to the bilateral lower extremities. The lower extremity venous reflux exam revealed the patient had venous incontinence noted in the right greater saphenous and bilateral common femoral vein. There is no evidence of deep or superficial vein thrombosis in the bilateral lower extremities. ====== Old notes 66 year old male who presented to the ER with bilateral lower extremity blisters which had started last week. he has a past medical history of leukemia, diabetes mellitus, hypertension, edema of both lower extremities, his recurrent skin infections, peripheral vascular disease, coronary artery disease, congestive heart failure and peripheral neuropathy. in the ER he was given Rocephin and put on Silvadene cream. he was put on oral doxycycline and was asked to follow-up with the [facility]. His last  hemoglobin A1c was 6.6 in December and he checks his blood sugar once a week. He does not have any physicians outside the [place] system. He does not recall any vascular duplex studies done either for arterial or venous disease but was told to wear compression stockings which he does not use 05/30/2016 -- we have not yet received any of his notes from the [facility] hospital system and his arterial and venous duplex studies are scheduled here in [place] around mid February. We are unable to have his insurance accepted by home health agencies and hence he is getting dressings only once a week. 06/06/16 -- -- I received a call from the patient's PCP at the [facility] at [facility] and spoke to Dr. Garvin Fila, phone number [phone] and fax number [phone]. She confirmed that no  vascular testing was done over the last 5 years and she would be happy to do them if the patient did want them to be done at the [place] and we could fax him a request. ============ Objective Constitutional Obese and well-hydrated in no acute distress. Vitals Time Taken: 8:56 AM, Height: 73 in, Weight: 331.7 lbs, BMI: 43.8, Temperature: 98.1 F, Pulse: 50 bpm, Respiratory Rate: 20 breaths/min, Blood Pressure: 134/59 mmHg. Respiratory normal breathing without difficulty. clear to auscultation bilaterally. Cardiovascular regular rate and rhythm with normal S1, S2. Psychiatric KASS, HERBERGER ([id number]) this patient is able to make decisions and demonstrates good insight into disease process. Alert and Oriented x 3. pleasant and cooperative. General Notes: Patient did have a layer of blistered skin which was necrotic and starting to slough off still covering the wound bed and the wound itself was very macerated due to trapping mainly serous fluid. This did require selective debridement today which patient tolerated very well without complication no pain was noted during the procedure. Integumentary (Hair, Skin) Wound #3 status is  Open. Original cause of wound was Blister. The wound is located on the Left,Medial Lower Leg. The wound measures 10.5cm length x 7cm width x 0.1cm depth; 57.727cm^2 area and 5.773cm^3 volume. There is no tunneling or undermining noted. There is a large amount of serous drainage noted. The wound margin is distinct with the outline attached to the wound base. There is small (1-33%) red granulation within the wound bed. There is a large (67-100%) amount of necrotic tissue within the wound bed including Eschar and Adherent Slough. Periwound temperature was noted as No Abnormality. The periwound has tenderness on palpation. Assessment Active Problems ICD-10 E11.622 - Type 2 diabetes mellitus with other skin ulcer L97.221 - Non-pressure chronic ulcer of left calf limited to breakdown of skin I87.312 - Chronic venous hypertension (idiopathic) with ulcer of left lower extremity I89.0 - Lymphedema, not elsewhere classified E66.01 - Morbid (severe) obesity due to excess calories Procedures Wound #3 Pre-procedure diagnosis of Wound #3 is a Diabetic Wound/Ulcer of the Lower Extremity located on the Left,Medial Lower Leg .Severity of Tissue Pre Debridement is: Fat layer exposed. There was a Non-Viable Tissue Open Wound/Selective [phone]) debridement with total area of 73.5 sq cm performed by STONE III, [name] E., PA-C. with the following instrument(s): Forceps and Scissors to remove Viable and Non-Viable tissue/material including Exudate and Fibrin/Slough after achieving pain control using Lidocaine 4% Topical Solution. A time out was conducted at 09:14, prior to the start of the procedure. There was no bleeding. The procedure was tolerated well with a pain level of 0 throughout and a pain level of 0 following the procedure. Post Debridement Measurements: 10.5cm length x 7cm width x 0.2cm depth; 11.545cm^3 volume. Character of Wound/Ulcer Post Debridement requires further debridement. Severity of  Tissue Post [place], SATORU ([id number]) Debridement is: Fat layer exposed. Post procedure Diagnosis Wound #3: Same as Pre-Procedure Plan Wound Cleansing: Wound #3 Left,Medial Lower Leg: Clean wound with Normal Saline. Cleanse wound with mild soap and water Anesthetic: Wound #3 Left,Medial Lower Leg: Topical Lidocaine 4% cream applied to wound bed prior to debridement Primary Wound Dressing: Wound #3 Left,Medial Lower Leg: Silvercel Non-Adherent Secondary Dressing: Wound #3 Left,Medial Lower Leg: ABD pad XtraSorb Dressing Change Frequency: Wound #3 Left,Medial Lower Leg: Change dressing every week Follow-up Appointments: Wound #3 Left,Medial Lower Leg: Return Appointment in 1 week. Edema Control: Wound #3 Left,Medial Lower Leg: 3 Layer Compression System - Left Lower Extremity - unna to anchor Elevate legs  to the level of the heart and pump ankles as often as possible Additional Orders / Instructions: Wound #3 Left,Medial Lower Leg: Increase protein intake. Fortunately patient did tolerate the selected debridement today without complication. We are going to continue with the silver alginate along with the three layer compression wraps. We will see were things stand in one weeks time. I feel like this will likely be much better since we now have clear golf the blistered skin which will hopefully no longer trap and further macerated the wound bed. We will see him for reevaluation in one weeks time if you have any concerns in the interim he will contact the office for additional recommendations. ROGER, KETTLES ([id number]) Electronic Signature(s) Signed: 02/03/2017 5:13:37 PM By: Worthy Keeler PA-C Entered By: Worthy Keeler on 02/03/2017 10:22:14 [name] ([id number]) -------------------------------------------------------------------------------- SuperBill Details Patient Name: [name] Date of Service: 02/03/2017 Medical Record Number: [id number] Patient Account  Number: [id number] Date of Birth/Sex: 02-10-1951 (66 y.o. Male) Treating RN: Ahmed Prima Primary Care Provider: PATIENT, NO Other Clinician: Referring Provider: Referral, Self Treating Provider/Extender: STONE III, [name] Weeks in Treatment: 1 Diagnosis Coding ICD-10 Codes Code Description E11.622 Type 2 diabetes mellitus with other skin ulcer L97.221 Non-pressure chronic ulcer of left calf limited to breakdown of skin I87.312 Chronic venous hypertension (idiopathic) with ulcer of left lower extremity I89.0 Lymphedema, not elsewhere classified E66.01 Morbid (severe) obesity due to excess calories Facility Procedures CPT4 Code Description: [id number] 97597 - DEBRIDE WOUND 1ST 20 SQ CM OR < ICD-10 Description Diagnosis L97.221 Non-pressure chronic ulcer of left calf limited to brea Modifier: kdown of sk Quantity: 1 in CPT4 Code Description: [id number] 97598 - DEBRIDE WOUND EA ADDL 20 SQ CM ICD-10 Description Diagnosis L97.221 Non-pressure chronic ulcer of left calf limited to brea Modifier: kdown of sk Quantity: 3 in Physician Procedures CPT4 Code Description: [id number] - WC PHYS DEBR WO ANESTH 20 SQ CM ICD-10 Description Diagnosis L97.221 Non-pressure chronic ulcer of left calf limited to brea Modifier: kdown of sk Quantity: 1 in CPT4 Code Description: [id number] 18299 - WC PHYS DEBR WO ANESTH EA ADD 20 CM ICD-10 Description Diagnosis L97.221 Non-pressure chronic ulcer of left calf limited to brea Shepherd, JODIE ([id number]) Modifier: kdown of sk Quantity: 3 in Electronic Signature(s) Signed: 02/03/2017 5:13:37 PM By: Worthy Keeler PA-C Entered By: Worthy Keeler on 02/03/2017 09:37:11

## 2017-02-08 ENCOUNTER — Encounter: Payer: PPO | Attending: Internal Medicine

## 2017-02-08 DIAGNOSIS — I87312 Chronic venous hypertension (idiopathic) with ulcer of left lower extremity: Secondary | ICD-10-CM | POA: Diagnosis not present

## 2017-02-08 DIAGNOSIS — E11622 Type 2 diabetes mellitus with other skin ulcer: Secondary | ICD-10-CM | POA: Insufficient documentation

## 2017-02-08 DIAGNOSIS — I890 Lymphedema, not elsewhere classified: Secondary | ICD-10-CM | POA: Diagnosis not present

## 2017-02-08 DIAGNOSIS — L97221 Non-pressure chronic ulcer of left calf limited to breakdown of skin: Secondary | ICD-10-CM | POA: Insufficient documentation

## 2017-02-09 NOTE — Progress Notes (Signed)
[name], [name] ([id number]) Visit Report for 02/08/2017 Arrival Information Details Patient Name: [name], [name] Date of Service: 02/08/2017 9:00 AM Medical Record Number: [id number] Patient Account Number: [id number] Date of Birth/Sex: [birth year] (66 y.o. Male) Treating RN: [name] Primary Care [name]: PATIENT, NO Other Clinician: Referring [name]: Referral, Self Treating Marin Milley/Extender: [name]: 1 Visit Information History Since Last Visit Added or deleted any medications: No Patient Arrived: Ambulatory Any new allergies or adverse reactions: No Arrival Time: 08:52 Had a fall or experienced change in No Accompanied By: self activities of daily living that may affect Transfer Assistance: None risk of falls: Patient Identification Verified: Yes Signs or symptoms of abuse/neglect since last No Secondary Verification Process Yes visito Completed: Hospitalized since last visit: No Patient Requires Transmission-Based No Has Dressing in Place as Prescribed: Yes Precautions: Has Compression in Place as Prescribed: Yes Patient Has Alerts: No Pain Present Now: No Electronic Signature(s) Signed: 02/08/2017 4:42:06 PM By: [name], BSN, RN, CWS, Kim RN, BSN Entered By: [name] on 02/08/2017 08:54:05 [name] ([id number]) -------------------------------------------------------------------------------- Compression Therapy Details Patient Name: [name] Date of Service: 02/08/2017 9:00 AM Medical Record Number: [id number] Patient Account Number: [id number] Date of Birth/Sex: 24-Apr-1951 (66 y.o. Male) Treating RN: [name] Primary Care [name]: PATIENT, NO Other Clinician: Referring [name]: Referral, Self Treating [name]/Extender: [name]: 1 Compression Therapy Performed for Wound Wound #3 Left,Medial Lower Leg Assessment: Performed By: Clinician [name] Barman, RN Compression Type: Three  Layer Pre Mooney ABI: 1.2 Electronic Signature(s) Signed: 02/08/2017 4:42:06 PM By: [name], BSN, RN, CWS, Kim RN, BSN Entered By: [name] on 02/08/2017 09:07:48 [name] ([id number]) -------------------------------------------------------------------------------- Encounter Discharge Information Details Patient Name: [name] Date of Service: 02/08/2017 9:00 AM Medical Record Number: [id number] Patient Account Number: [id number] Date of Birth/Sex: 12/30/1950 (66 y.o. Male) Treating RN: [name] Primary Care [name]: PATIENT, NO Other Clinician: Referring [name]: Referral, Self Treating [name]/Extender: [name] in Mooney: 1 Encounter Discharge Information Items Discharge Pain Level: 0 Discharge Condition: Stable Ambulatory Status: Ambulatory Discharge Destination: Home Private Transportation: Auto Schedule Follow-up Appointment: Yes Medication Reconciliation completed and Yes provided to Patient/Care Nai Borromeo: Clinical Summary of Care: Electronic Signature(s) Signed: 02/08/2017 4:42:06 PM By: [name], BSN, RN, CWS, Kim RN, BSN Entered By: [name] on 02/08/2017 09:09:13 [name] ([id number]) -------------------------------------------------------------------------------- Patient/Caregiver Education Details Patient Name: [name] Date of Service: 02/08/2017 9:00 AM Medical Record Patient Account Number: [id number] Number: Treating RN: [name] [birth year] (66 y.o. Other Clinician: Date of Birth/Gender: Male) Treating [name], [name] Primary Care Physician: PATIENT, NO Physician/Extender: G Referring Physician: Referral, Self Weeks in Mooney: 1 Education Assessment Education Provided To: Patient Education Topics Provided Venous: Handouts: Controlling Swelling with Compression Stockings , Other: elevate and keep dressing dry Methods: Demonstration, Explain/Verbal Responses: State  content correctly Electronic Signature(s) Signed: 02/08/2017 4:42:06 PM By: [name], BSN, RN, CWS, Kim RN, BSN Entered By: [name] on 02/08/2017 09:09:00 [name] ([id number]) -------------------------------------------------------------------------------- Wound Assessment Details Patient Name: [name] Date of Service: 02/08/2017 9:00 AM Medical Record Number: [id number] Patient Account Number: [id number] Date of Birth/Sex: 1950-09-12 (66 y.o. Male) Treating RN: [name] Primary Care [name]: PATIENT, NO Other Clinician: Referring [name]: Referral, Self Treating [name]: [name]: 1 Wound Status Wound Number: 3 Primary Diabetic Wound/Ulcer of the Lower Etiology: Extremity Wound Location: Left Lower Leg - Medial Wound Open Wounding Event: Blister Status: Date  Acquired: 01/24/2017 Comorbid Hypertension, Type II Diabetes, Weeks Of Mooney: 1 History: Neuropathy, Received Chemotherapy Clustered Wound: Yes Photos Wound Measurements Length: (cm) 8.5 % Reduction i Width: (cm) 5 % Reduction i Depth: (cm) 0.1 Epithelializa Clustered Quantity: 2 Tunneling: Area: (cm) 33.379 Undermining: Volume: (cm) 3.338 n Area: 12.8% n Volume: 12.8% tion: Small (1-33%) No No Wound Description Classification: Grade 1 Foul Odor Af Wound Margin: Distinct, outline attached Slough/Fibri Exudate Amount: Large Exudate Type: Serous Exudate Color: amber ter Cleansing: No no Yes Wound Bed Granulation Amount: Small (1-33%) Granulation Quality: Red Necrotic Amount: Large (67-100%) Necrotic Quality: Eschar, Adherent Slough Periwound Skin Texture Texture Color [place], Estell ([id number]) No Abnormalities Noted: No No Abnormalities Noted: No Rubor: Yes Moisture No Abnormalities Noted: No Temperature / Pain Maceration: Yes Temperature: No Abnormality Tenderness on Palpation: Yes Wound Preparation Ulcer Cleansing:  Rinsed/Irrigated with Saline Topical Anesthetic Applied: Other: lidocaine 4%, Mooney Notes Wound #3 (Left, Medial Lower Leg) 1. Cleansed with: Cleanse wound with antibacterial soap and water 4. Dressing Applied: Other dressing (specify in notes) 5. Secondary Dressing Applied ABD Pad 7. Secured with 3 Layer Compression System - Left Lower Extremity Notes unna to anchor, xtrasorb, Research officer, trade union) Signed: 02/08/2017 4:42:06 PM By: [name], BSN, RN, CWS, Kim RN, BSN Entered By: [name] on 02/08/2017 09:07:12

## 2017-02-17 ENCOUNTER — Encounter: Payer: PPO | Admitting: Surgery

## 2017-02-17 DIAGNOSIS — I890 Lymphedema, not elsewhere classified: Secondary | ICD-10-CM | POA: Diagnosis not present

## 2017-02-17 DIAGNOSIS — E11622 Type 2 diabetes mellitus with other skin ulcer: Secondary | ICD-10-CM | POA: Diagnosis not present

## 2017-02-17 DIAGNOSIS — S80822A Blister (nonthermal), left lower leg, initial encounter: Secondary | ICD-10-CM | POA: Diagnosis not present

## 2017-02-19 NOTE — Progress Notes (Signed)
JOHNEL, YIELDING ([id number]) Visit Report for 02/17/2017 Arrival Information Details Patient Name: [name], [name] Date of Service: 02/17/2017 8:00 AM Medical Record Number: [id number] Patient Account Number: [id number] Date of Birth/Sex: [birth year] (66 y.o. Male) Treating RN: Ahmed Prima Primary Care Kaci Freel: PATIENT, NO Other Clinician: Referring Malyk Girouard: Referral, Self Treating [name]/Extender: Frann Rider in Treatment: 3 Visit Information History Since Last Visit All ordered tests and consults were completed: No Patient Arrived: Ambulatory Added or deleted any medications: No Arrival Time: 08:07 Any new allergies or adverse reactions: No Accompanied By: self Had a fall or experienced change in No Transfer Assistance: None activities of daily living that may affect Patient Identification Verified: Yes risk of falls: Secondary Verification Process Yes Signs or symptoms of abuse/neglect since last No Completed: visito Patient Requires Transmission-Based No Hospitalized since last visit: No Precautions: Has Dressing in Place as Prescribed: Yes Patient Has Alerts: No Has Compression in Place as Prescribed: Yes Pain Present Now: No Electronic Signature(s) Signed: 02/17/2017 9:47:23 AM By: Alric Quan Entered By: Alric Quan on 02/17/2017 08:10:04 [name] ([id number]) -------------------------------------------------------------------------------- Clinic Level of Care Assessment Details Patient Name: [name] Date of Service: 02/17/2017 8:00 AM Medical Record Number: [id number] Patient Account Number: [id number] Date of Birth/Sex: 23-Mar-1951 (66 y.o. Male) Treating RN: Ahmed Prima Primary Care Rabecka Brendel: PATIENT, NO Other Clinician: Referring Lateya Dauria: Referral, Self Treating Layani Foronda/Extender: Frann Rider in Treatment: 3 Clinic Level of Care Assessment Items TOOL 4 Quantity Score X - Use when only an EandM is performed on  FOLLOW-UP visit 1 0 ASSESSMENTS - Nursing Assessment / Reassessment X - Reassessment of Co-morbidities (includes updates in patient status) 1 10 X - Reassessment of Adherence to Treatment Plan 1 5 ASSESSMENTS - Wound and Skin Assessment / Reassessment X - Simple Wound Assessment / Reassessment - one wound 1 5 []  - Complex Wound Assessment / Reassessment - multiple wounds 0 []  - Dermatologic / Skin Assessment (not related to wound area) 0 ASSESSMENTS - Focused Assessment []  - Circumferential Edema Measurements - multi extremities 0 []  - Nutritional Assessment / Counseling / Intervention 0 []  - Lower Extremity Assessment (monofilament, tuning fork, pulses) 0 []  - Peripheral Arterial Disease Assessment (using hand held doppler) 0 ASSESSMENTS - Ostomy and/or Continence Assessment and Care []  - Incontinence Assessment and Management 0 []  - Ostomy Care Assessment and Management (repouching, etc.) 0 PROCESS - Coordination of Care X - Simple Patient / Family Education for ongoing care 1 15 []  - Complex (extensive) Patient / Family Education for ongoing care 0 []  - Staff obtains Programmer, systems, Records, Test Results / Process Orders 0 []  - Staff telephones HHA, Nursing Homes / Clarify orders / etc 0 []  - Routine Transfer to another Facility (non-emergent condition) 0 [place], [name] ([id number]) []  - Routine Hospital Admission (non-emergent condition) 0 []  - New Admissions / Biomedical engineer / Ordering NPWT, Apligraf, etc. 0 []  - Emergency Hospital Admission (emergent condition) 0 X - Simple Discharge Coordination 1 10 []  - Complex (extensive) Discharge Coordination 0 PROCESS - Special Needs []  - Pediatric / Minor Patient Management 0 []  - Isolation Patient Management 0 []  - Hearing / Language / Visual special needs 0 []  - Assessment of Community assistance (transportation, D/C planning, etc.) 0 []  - Additional assistance / Altered mentation 0 []  - Support Surface(s) Assessment (bed, cushion,  seat, etc.) 0 INTERVENTIONS - Wound Cleansing / Measurement X - Simple Wound Cleansing - one wound 1 5 []  - Complex Wound Cleansing - multiple wounds 0 X - Wound Imaging (  photographs - any number of wounds) 1 5 []  - Wound Tracing (instead of photographs) 0 []  - Simple Wound Measurement - one wound 0 []  - Complex Wound Measurement - multiple wounds 0 INTERVENTIONS - Wound Dressings []  - Small Wound Dressing one or multiple wounds 0 []  - Medium Wound Dressing one or multiple wounds 0 []  - Large Wound Dressing one or multiple wounds 0 []  - Application of Medications - topical 0 []  - Application of Medications - injection 0 INTERVENTIONS - Miscellaneous []  - External ear exam 0 [name], [name] ([id number]) []  - Specimen Collection (cultures, biopsies, blood, body fluids, etc.) 0 []  - Specimen(s) / Culture(s) sent or taken to Lab for analysis 0 []  - Patient Transfer (multiple staff / Harrel Lemon Lift / Similar devices) 0 []  - Simple Staple / Suture removal (25 or less) 0 []  - Complex Staple / Suture removal (26 or more) 0 []  - Hypo / Hyperglycemic Management (close monitor of Blood Glucose) 0 []  - Ankle / Brachial Index (ABI) - do not check if billed separately 0 X - Vital Signs 1 5 Has the patient been seen at the hospital within the last three years: Yes Total Score: 60 Level Of Care: New/Established - Level 2 Electronic Signature(s) Signed: 02/17/2017 9:47:23 AM By: Alric Quan Entered By: Alric Quan on 02/17/2017 09:16:18 [name] ([id number]) -------------------------------------------------------------------------------- Encounter Discharge Information Details Patient Name: [name] Date of Service: 02/17/2017 8:00 AM Medical Record Number: [id number] Patient Account Number: [id number] Date of Birth/Sex: 1950/06/23 (66 y.o. Male) Treating RN: Ahmed Prima Primary Care Orvill Coulthard: PATIENT, NO Other Clinician: Referring Richanda Darin: Referral, Self Treating  [name]/Extender: Frann Rider in Treatment: 3 Encounter Discharge Information Items Discharge Pain Level: 0 Discharge Condition: Stable Ambulatory Status: Ambulatory Discharge Destination: Home Transportation: Private Auto Accompanied By: self Schedule Follow-up Appointment: No Medication Reconciliation completed and provided to Patient/Care No Esta Carmon: Provided on Clinical Summary of Care: 02/17/2017 Form Type Recipient Paper Patient DT Electronic Signature(s) Signed: 02/17/2017 4:20:56 PM By: Ruthine Dose Entered By: Ruthine Dose on 02/17/2017 08:26:06 [name] ([id number]) -------------------------------------------------------------------------------- Lower Extremity Assessment Details Patient Name: [name] Date of Service: 02/17/2017 8:00 AM Medical Record Number: [id number] Patient Account Number: [id number] Date of Birth/Sex: 10/23/1950 (66 y.o. Male) Treating RN: Ahmed Prima Primary Care Elianne Gubser: PATIENT, NO Other Clinician: Referring Tammee Thielke: Referral, Self Treating Arabell Neria/Extender: Frann Rider in Treatment: 3 Edema Assessment Assessed: [Left: No] [Right: No] E[Left: dema] [Right: :] Calf Left: Right: Point of Measurement: 36 cm From Medial Instep 41.6 cm cm Ankle Left: Right: Point of Measurement: 11 cm From Medial Instep 27 cm cm Vascular Assessment Pulses: Dorsalis Pedis Palpable: [Left:Yes] Posterior Tibial Extremity colors, hair growth, and conditions: Extremity Color: [Left:Hyperpigmented] Temperature of Extremity: [Left:Warm] Capillary Refill: [Left:< 3 seconds] Toe Nail Assessment Left: Right: Thick: No Discolored: No Deformed: No Improper Length and Hygiene: No Electronic Signature(s) Signed: 02/17/2017 9:47:23 AM By: Alric Quan Entered By: Alric Quan on 02/17/2017 08:18:13 [name] ([id number]) -------------------------------------------------------------------------------- Multi  Wound Chart Details Patient Name: [name] Date of Service: 02/17/2017 8:00 AM Medical Record Number: [id number] Patient Account Number: [id number] Date of Birth/Sex: [birth year] (66 y.o. Male) Treating RN: Ahmed Prima Primary Care Cristen Murcia: PATIENT, NO Other Clinician: Referring Nayzeth Altman: Referral, Self Treating Christabell Loseke/Extender: Frann Rider in Treatment: 3 Vital Signs Height(in): 73 Pulse(bpm): 50 Weight(lbs): 331.7 Blood Pressure 140/65 (mmHg): Body Mass Index(BMI): 44 Temperature(F): 97.7 Respiratory Rate 20 (breaths/min): Photos: [3:No Photos] [N/A:N/A] Wound Location: [3:Left Lower Leg - Medial] [N/A:N/A] Wounding Event: [3:Blister] [N/A:N/A] Primary  Etiology: [3:Diabetic Wound/Ulcer of the Lower Extremity] [N/A:N/A] Comorbid History: [3:Hypertension, Type II Diabetes, Neuropathy, Received Chemotherapy] [N/A:N/A] Date Acquired: [3:01/24/2017] [N/A:N/A] Weeks of Treatment: [3:3] [N/A:N/A] Wound Status: [3:Open] [N/A:N/A] Clustered Wound: [3:Yes] [N/A:N/A] Clustered Quantity: [3:2] [N/A:N/A] Measurements L x W x D 0x0x0 [N/A:N/A] (cm) Area (cm) : [3:0] [N/A:N/A] Volume (cm) : [3:0] [N/A:N/A] % Reduction in Area: [3:100.00%] [N/A:N/A] % Reduction in Volume: 100.00% [N/A:N/A] Classification: [3:Grade 1] [N/A:N/A] Exudate Amount: [3:None Present] [N/A:N/A] Wound Margin: [3:Distinct, outline attached] [N/A:N/A] Granulation Amount: [3:None Present (0%)] [N/A:N/A] Necrotic Amount: [3:None Present (0%)] [N/A:N/A] Epithelialization: [3:Large (67-100%)] [N/A:N/A] Periwound Skin Texture: No Abnormalities Noted [N/A:N/A] Periwound Skin [3:Maceration: No] [N/A:N/A] Moisture: Periwound Skin Color: Rubor: Yes [N/A:N/A] Temperature: No Abnormality N/A N/A Tenderness on Yes N/A N/A Palpation: Wound Preparation: Ulcer Cleansing: N/A N/A Rinsed/Irrigated with Saline Topical Anesthetic Applied: None Treatment Notes Electronic Signature(s) Signed:  02/17/2017 8:28:37 AM By: Christin Fudge MD, FACS Entered By: Christin Fudge on 02/17/2017 08:28:37 [name] ([id number]) -------------------------------------------------------------------------------- Multi-Disciplinary Care Plan Details Patient Name: [name] Date of Service: 02/17/2017 8:00 AM Medical Record Number: [id number] Patient Account Number: [id number] Date of Birth/Sex: 1951-04-24 (66 y.o. Male) Treating RN: Ahmed Prima Primary Care Yuchen Fedor: PATIENT, NO Other Clinician: Referring [place] Franson: Referral, Self Treating Birt Reinoso/Extender: Frann Rider in Treatment: 3 Active Inactive Electronic Signature(s) Signed: 02/17/2017 9:47:23 AM By: Alric Quan Entered By: Alric Quan on 02/17/2017 08:23:17 [name] ([id number]) -------------------------------------------------------------------------------- Pain Assessment Details Patient Name: [name] Date of Service: 02/17/2017 8:00 AM Medical Record Number: [id number] Patient Account Number: [id number] Date of Birth/Sex: 1950/10/04 (66 y.o. Male) Treating RN: Ahmed Prima Primary Care Amarian Botero: PATIENT, NO Other Clinician: Referring Alleah Dearman: Referral, Self Treating Harel Repetto/Extender: Frann Rider in Treatment: 3 Active Problems Location of Pain Severity and Description of Pain Patient Has Paino No Site Locations Pain Management and Medication Current Pain Management: Electronic Signature(s) Signed: 02/17/2017 9:47:23 AM By: Alric Quan Entered By: Alric Quan on 02/17/2017 08:10:11 [name] ([id number]) -------------------------------------------------------------------------------- Patient/Caregiver Education Details Patient Name: [name] Date of Service: 02/17/2017 8:00 AM Medical Record Number: [id number] Patient Account Number: [id number] Date of Birth/Gender: 29-Apr-1951 (66 y.o. Male) Treating RN: Ahmed Prima Primary Care Physician: PATIENT,  NO Other Clinician: Referring Physician: Referral, Self Treating Physician/Extender: Frann Rider in Treatment: 3 Education Assessment Education Provided To: Patient Education Topics Provided Wound/Skin Impairment: Handouts: Other: Wear you compression stockings. See your vascular doctor. Methods: Explain/Verbal Responses: State content correctly Electronic Signature(s) Signed: 02/17/2017 9:47:23 AM By: Alric Quan Entered By: Alric Quan on 02/17/2017 08:26:16 [name] ([id number]) -------------------------------------------------------------------------------- Wound Assessment Details Patient Name: [name] Date of Service: 02/17/2017 8:00 AM Medical Record Number: [id number] Patient Account Number: [id number] Date of Birth/Sex: [birth year] (66 y.o. Male) Treating RN: Ahmed Prima Primary Care Kea Callan: PATIENT, NO Other Clinician: Referring Lexandra Rettke: Referral, Self Treating Eniola Cerullo/Extender: Frann Rider in Treatment: 3 Wound Status Wound Number: 3 Primary Diabetic Wound/Ulcer of the Lower Etiology: Extremity Wound Location: Left Lower Leg - Medial Wound Open Wounding Event: Blister Status: Date Acquired: 01/24/2017 Comorbid Hypertension, Type II Diabetes, Weeks Of Treatment: 3 History: Neuropathy, Received Chemotherapy Clustered Wound: Yes Photos Photo Uploaded By: Alric Quan on 02/17/2017 09:48:50 Wound Measurements Length: (cm) Width: (cm) Depth: (cm) Clustered Quantity: Area: (cm) Volume: (cm) 0 % Reduction in Area: 100% 0 % Reduction in Volume: 100% 0 Epithelialization: Large (67-100%) 2 Tunneling: No 0 Undermining: No 0 Wound Description Classification: Grade 1 Wound Margin: Distinct, outline attached Exudate Amount: None Present Foul Odor After Cleansing: No Slough/Fibrino No Wound  Bed Granulation Amount: None Present (0%) Necrotic Amount: None Present (0%) Periwound Skin Texture Texture Color No  Abnormalities Noted: No No Abnormalities Noted: No [name], [name] ([id number]) Moisture Rubor: Yes No Abnormalities Noted: No Temperature / Pain Maceration: No Temperature: No Abnormality Tenderness on Palpation: Yes Wound Preparation Ulcer Cleansing: Rinsed/Irrigated with Saline Topical Anesthetic Applied: None Electronic Signature(s) Signed: 02/17/2017 9:47:23 AM By: Alric Quan Entered By: Alric Quan on 02/17/2017 08:22:43 [name] ([id number]) -------------------------------------------------------------------------------- Vitals Details Patient Name: [name] Date of Service: 02/17/2017 8:00 AM Medical Record Number: [id number] Patient Account Number: [id number] Date of Birth/Sex: 1951/04/01 (66 y.o. Male) Treating RN: Ahmed Prima Primary Care [name]: PATIENT, NO Other Clinician: Referring Shailen Thielen: Referral, Self Treating Starlet Gallentine/Extender: Frann Rider in Treatment: 3 Vital Signs Time Taken: 08:10 Temperature (F): 97.7 Height (in): 73 Pulse (bpm): 50 Weight (lbs): 331.7 Respiratory Rate (breaths/min): 20 Body Mass Index (BMI): 43.8 Blood Pressure (mmHg): 140/65 Reference Range: 80 - 120 mg / dl Electronic Signature(s) Signed: 02/17/2017 9:47:23 AM By: Alric Quan Entered By: Alric Quan on 02/17/2017 08:10:44

## 2017-02-20 ENCOUNTER — Encounter (INDEPENDENT_AMBULATORY_CARE_PROVIDER_SITE_OTHER): Payer: Self-pay | Admitting: Vascular Surgery

## 2017-02-20 ENCOUNTER — Ambulatory Visit (INDEPENDENT_AMBULATORY_CARE_PROVIDER_SITE_OTHER): Payer: PPO | Admitting: Vascular Surgery

## 2017-02-20 VITALS — BP 138/79 | HR 75 | Resp 16 | Ht 73.0 in | Wt 325.8 lb

## 2017-02-20 DIAGNOSIS — I890 Lymphedema, not elsewhere classified: Secondary | ICD-10-CM

## 2017-02-20 DIAGNOSIS — I10 Essential (primary) hypertension: Secondary | ICD-10-CM

## 2017-02-20 DIAGNOSIS — I872 Venous insufficiency (chronic) (peripheral): Secondary | ICD-10-CM

## 2017-02-20 NOTE — Progress Notes (Signed)
[name] ([id number]) Visit Report for 02/17/2017 Chief Complaint Document Details Patient Name: [name], [name] Date of Service: 02/17/2017 8:00 AM Medical Record Number: [id number] Patient Account Number: [id number] Date of Birth/Sex: 1950-06-10 (66 y.o. Male) Treating RN: Ahmed Prima Primary Care Provider: PATIENT, NO Other Clinician: Referring Provider: Referral, Self Treating Provider/Extender: Frann Rider in Treatment: 3 Information Obtained from: Patient Chief Complaint this patient is returning to [place] after having recurrence of left lower extremity blisters Electronic Signature(s) Signed: 02/17/2017 8:28:42 AM By: Christin Fudge MD, FACS Entered By: Christin Fudge on 02/17/2017 08:28:42 [name] ([id number]) -------------------------------------------------------------------------------- HPI Details Patient Name: [name] Date of Service: 02/17/2017 8:00 AM Medical Record Number: [id number] Patient Account Number: [id number] Date of Birth/Sex: 1951/02/23 (66 y.o. Male) Treating RN: Ahmed Prima Primary Care Provider: PATIENT, NO Other Clinician: Referring Provider: Referral, Self Treating Provider/Extender: Frann Rider in Treatment: 3 History of Present Illness Location: left lower extremity Quality: Patient reports experiencing a dull pain to affected area(s). Severity: Patient states wound are getting worse. Duration: Patient has had the swelling for > 5 days prior to seeking treatment at the wound center. the blisters only arisen for the last week Timing: Pain in wound is Intermittent (comes and goes Context: The wound appeared gradually over time Modifying Factors: Other treatment(s) tried include:he has been put on oral doxycycline and has had Rocephin at the ER for 2 days Associated Signs and Symptoms: Patient reports having increase swelling. HPI Description: 66 year old male seen by as in February of this year and was referred to  vein and vascular for studies and opinion from the vascular surgeons. The patient returns today with a fresh problem having had blisters on his left lower extremity which have been there for about 5 days and he clearly states that he has been wearing his compression stockings as advised though he could not read the moderate compression and has been wearing light compression. Review of his electronic medical records note that he had lower extremity arterial duplex examination done on 06/23/2016 which showed no hemodynamically significant stenosis in the bilateral lower extremity arterial system. He also had a lower extremity venous reflux examination done on 07/07/2016 and it was noted that he had venous incompetence in the right great saphenous vein and bilateral common femoral veins. Patient was seen by Dr. Tamala Julian on the same day and for some reason his notes do not reflect the venous studies or the arterial studies and he recommended patient do a venous duplex ultrasound to look for reflux and return to see him.he would also consider a lymph pump if required. The patient was told that his workup was normal and hence the patient canceled his follow-up appointment. 02/03/17 on evaluation today patient left medial lower extremity blister appears to be doing about the same. It is still continuing to drain and there's still the blistered skin covering the wound bed which is making it difficult for the alternate to do its job. Fortunately there is no evidence of cellulitis. No fevers chills noted. Patient states in general he is not having any significant discomfort. Patient's lower extremity arterial duplex exam revealed that patient was hemodynamically stable with no evidence of stenosis in regard to the bilateral lower extremities. The lower extremity venous reflux exam revealed the patient had venous incontinence noted in the right greater saphenous and bilateral common femoral vein. There is no  evidence of deep or superficial vein thrombosis in the bilateral lower extremities. ====== Old notes [name] ([id number]) 66 year old male who  presented to the ER with bilateral lower extremity blisters which had started last week. he has a past medical history of leukemia, diabetes mellitus, hypertension, edema of both lower extremities, his recurrent skin infections, peripheral vascular disease, coronary artery disease, congestive heart failure and peripheral neuropathy. in the ER he was given Rocephin and put on Silvadene cream. he was put on oral doxycycline and was asked to follow-up with the [facility]. His last hemoglobin A1c was 6.6 in December and he checks his blood sugar once a week. He does not have any physicians outside the [place] system. He does not recall any vascular duplex studies done either for arterial or venous disease but was told to wear compression stockings which he does not use 05/30/2016 -- we have not yet received any of his notes from the [facility] hospital system and his arterial and venous duplex studies are scheduled here in [place] around mid February. We are unable to have his insurance accepted by home health agencies and hence he is getting dressings only once a week. 06/06/16 -- -- I received a call from the patient's PCP at the [facility] and spoke to Dr. Garvin Fila, phone number [phone] and fax number [phone]. She confirmed that no vascular testing was done over the last 5 years and she would be happy to do them if the patient did want them to be done at the [place] and we could fax him a request. ============ Electronic Signature(s) Signed: 02/17/2017 8:29:31 AM By: Christin Fudge MD, FACS Entered By: Christin Fudge on 02/17/2017 08:29:31 [name] ([id number]) -------------------------------------------------------------------------------- Physical Exam Details Patient Name: [name] Date of Service: 02/17/2017 8:00 AM Medical  Record Number: [id number] Patient Account Number: [id number] Date of Birth/Sex: 1950/07/12 (66 y.o. Male) Treating RN: Ahmed Prima Primary Care Provider: PATIENT, NO Other Clinician: Referring Provider: Referral, Self Treating Provider/Extender: Frann Rider in Treatment: 3 Constitutional . Pulse regular. Respirations normal and unlabored. Afebrile. . Eyes Nonicteric. Reactive to light. Ears, Nose, Mouth, and Throat Lips, teeth, and gums WNL.Marland Kitchen Moist mucosa without lesions. Neck supple and nontender. No palpable supraclavicular or cervical adenopathy. Normal sized without goiter. Respiratory WNL. No retractions.. Cardiovascular Pedal Pulses WNL. No clubbing, cyanosis or edema. Genitourinary (GU) No hydrocele, spermatocele, tenderness of the cord, or testicular mass.Marland Kitchen Penis without lesions.Lowella Fairy without lesions. No cystocele, or rectocele. Pelvic support intact, no discharge.Marland Kitchen Urethra without masses, tenderness or scarring.Marland Kitchen Lymphatic No adneopathy. No adenopathy. No adenopathy. Musculoskeletal Adexa without tenderness or enlargement.. Digits and nails w/o clubbing, cyanosis, infection, petechiae, ischemia, or inflammatory conditions.. Integumentary (Hair, Skin) No suspicious lesions. No crepitus or fluctuance. No peri-wound warmth or erythema. No masses.Marland Kitchen Psychiatric Judgement and insight Intact.. No evidence of depression, anxiety, or agitation.. Notes the left lower extremity lymphedema as well as current cold and the wound is completely healed with no open areas Electronic Signature(s) Signed: 02/17/2017 8:29:56 AM By: Christin Fudge MD, FACS Entered By: Christin Fudge on 02/17/2017 08:29:56 [name] ([id number]) -------------------------------------------------------------------------------- Physician Orders Details Patient Name: [name] Date of Service: 02/17/2017 8:00 AM Medical Record Number: [id number] Patient Account Number: [id number] Date  of Birth/Sex: [birth year] (66 y.o. Male) Treating RN: Ahmed Prima Primary Care Provider: PATIENT, NO Other Clinician: Referring Provider: Referral, Self Treating Provider/Extender: Frann Rider in Treatment: 3 Verbal / Phone Orders: Yes Clinician: Carolyne Fiscal, Debi Read Back and Verified: Yes Diagnosis Coding Discharge From [facility] Services o Discharge from [place] you compression stockings. See your vascular doctor. Please call our office  if you have any questions or concerns. Electronic Signature(s) Signed: 02/17/2017 8:30:03 AM By: Christin Fudge MD, FACS Entered By: Christin Fudge on 02/17/2017 08:30:02 [name] ([id number]) -------------------------------------------------------------------------------- Problem List Details Patient Name: [name] Date of Service: 02/17/2017 8:00 AM Medical Record Number: [id number] Patient Account Number: [id number] Date of Birth/Sex: 1950/06/07 (66 y.o. Male) Treating RN: Ahmed Prima Primary Care Provider: PATIENT, NO Other Clinician: Referring Provider: Referral, Self Treating Provider/Extender: Frann Rider in Treatment: 3 Active Problems ICD-10 Encounter Code Description Active Date Diagnosis E11.622 Type 2 diabetes mellitus with other skin ulcer 01/27/2017 Yes L97.221 Non-pressure chronic ulcer of left calf limited to 01/27/2017 Yes breakdown of skin I87.312 Chronic venous hypertension (idiopathic) with ulcer of left 01/27/2017 Yes lower extremity I89.0 Lymphedema, not elsewhere classified 01/27/2017 Yes E66.01 Morbid (severe) obesity due to excess calories 01/27/2017 Yes Inactive Problems Resolved Problems Electronic Signature(s) Signed: 02/17/2017 8:28:31 AM By: Christin Fudge MD, FACS Entered By: Christin Fudge on 02/17/2017 08:28:31 [name] ([id number]) -------------------------------------------------------------------------------- Progress Note Details Patient Name: [name] Date of Service: 02/17/2017 8:00 AM Medical Record Number: [id number] Patient Account Number: [id number] Date of Birth/Sex: Feb 07, 1951 (66 y.o. Male) Treating RN: Ahmed Prima Primary Care Provider: PATIENT, NO Other Clinician: Referring Provider: Referral, Self Treating Provider/Extender: Frann Rider in Treatment: 3 Subjective Chief Complaint Information obtained from Patient this patient is returning to [place] after having recurrence of left lower extremity blisters History of Present Illness (HPI) The following HPI elements were documented for the patient's wound: Location: left lower extremity Quality: Patient reports experiencing a dull pain to affected area(s). Severity: Patient states wound are getting worse. Duration: Patient has had the swelling for > 5 days prior to seeking treatment at the wound center. the blisters only arisen for the last week Timing: Pain in wound is Intermittent (comes and goes Context: The wound appeared gradually over time Modifying Factors: Other treatment(s) tried include:he has been put on oral doxycycline and has had Rocephin at the ER for 2 days Associated Signs and Symptoms: Patient reports having increase swelling. 66 year old male seen by as in February of this year and was referred to vein and vascular for studies and opinion from the vascular surgeons. The patient returns today with a fresh problem having had blisters on his left lower extremity which have been there for about 5 days and he clearly states that he has been wearing his compression stockings as advised though he could not read the moderate compression and has been wearing light compression. Review of his electronic medical records note that he had lower extremity arterial duplex examination done on 06/23/2016 which showed no hemodynamically significant stenosis in the bilateral lower extremity arterial system. He also had a lower extremity venous reflux examination  done on 07/07/2016 and it was noted that he had venous incompetence in the right great saphenous vein and bilateral common femoral veins. Patient was seen by Dr. Tamala Julian on the same day and for some reason his notes do not reflect the venous studies or the arterial studies and he recommended patient do a venous duplex ultrasound to look for reflux and return to see him.he would also consider a lymph pump if required. The patient was told that his workup was normal and hence the patient canceled his follow-up appointment. 02/03/17 on evaluation today patient left medial lower extremity blister appears to be doing about the same. It is still continuing to drain and there's still the blistered skin covering the wound bed which is making it difficult  for the alternate to do its job. Fortunately there is no evidence of cellulitis. No fevers chills noted. Patient states in general he is not having any significant discomfort. Patient's lower extremity arterial duplex exam revealed that patient was hemodynamically stable with no [place], [name] ([id number]) evidence of stenosis in regard to the bilateral lower extremities. The lower extremity venous reflux exam revealed the patient had venous incontinence noted in the right greater saphenous and bilateral common femoral vein. There is no evidence of deep or superficial vein thrombosis in the bilateral lower extremities. ====== Old notes 66 year old male who presented to the ER with bilateral lower extremity blisters which had started last week. he has a past medical history of leukemia, diabetes mellitus, hypertension, edema of both lower extremities, his recurrent skin infections, peripheral vascular disease, coronary artery disease, congestive heart failure and peripheral neuropathy. in the ER he was given Rocephin and put on Silvadene cream. he was put on oral doxycycline and was asked to follow-up with the [facility]. His last hemoglobin A1c was 6.6 in  December and he checks his blood sugar once a week. He does not have any physicians outside the [place] system. He does not recall any vascular duplex studies done either for arterial or venous disease but was told to wear compression stockings which he does not use 05/30/2016 -- we have not yet received any of his notes from the [facility] hospital system and his arterial and venous duplex studies are scheduled here in [place] around mid February. We are unable to have his insurance accepted by home health agencies and hence he is getting dressings only once a week. 06/06/16 -- -- I received a call from the patient's PCP at the [facility] at [facility] and spoke to Dr. Garvin Fila, phone number [phone] and fax number [phone]. She confirmed that no vascular testing was done over the last 5 years and she would be happy to do them if the patient did want them to be done at the [place] and we could fax him a request. ============ Objective Constitutional Pulse regular. Respirations normal and unlabored. Afebrile. Vitals Time Taken: 8:10 AM, Height: 73 in, Weight: 331.7 lbs, BMI: 43.8, Temperature: 97.7 F, Pulse: 50 bpm, Respiratory Rate: 20 breaths/min, Blood Pressure: 140/65 mmHg. Eyes Nonicteric. Reactive to light. Ears, Nose, Mouth, and Throat Lips, teeth, and gums WNL.Marland Kitchen Moist mucosa without lesions. Neck [place], [name] ([id number]) supple and nontender. No palpable supraclavicular or cervical adenopathy. Normal sized without goiter. Respiratory WNL. No retractions.. Cardiovascular Pedal Pulses WNL. No clubbing, cyanosis or edema. Genitourinary (GU) No hydrocele, spermatocele, tenderness of the cord, or testicular mass.Marland Kitchen Penis without lesions.Lowella Fairy without lesions. No cystocele, or rectocele. Pelvic support intact, no discharge.Marland Kitchen Urethra without masses, tenderness or scarring.Marland Kitchen Lymphatic No adneopathy. No adenopathy. No adenopathy. Musculoskeletal Adexa without tenderness or enlargement..  Digits and nails w/o clubbing, cyanosis, infection, petechiae, ischemia, or inflammatory conditions.Marland Kitchen Psychiatric Judgement and insight Intact.. No evidence of depression, anxiety, or agitation.. General Notes: the left lower extremity lymphedema as well as current cold and the wound is completely healed with no open areas Integumentary (Hair, Skin) No suspicious lesions. No crepitus or fluctuance. No peri-wound warmth or erythema. No masses.. Wound #3 status is Open. Original cause of wound was Blister. The wound is located on the Left,Medial Lower Leg. The wound measures 0cm length x 0cm width x 0cm depth; 0cm^2 area and 0cm^3 volume. There is no tunneling or undermining noted. There is a none present amount of drainage noted. The wound  margin is distinct with the outline attached to the wound base. There is no granulation within the wound bed. There is no necrotic tissue within the wound bed. The periwound skin appearance exhibited: Rubor. The periwound skin appearance did not exhibit: Maceration. Periwound temperature was noted as No Abnormality. The periwound has tenderness on palpation. Assessment Active Problems ICD-10 E11.622 - Type 2 diabetes mellitus with other skin ulcer L97.221 - Non-pressure chronic ulcer of left calf limited to breakdown of skin I87.312 - Chronic venous hypertension (idiopathic) with ulcer of left lower extremity I89.0 - Lymphedema, not elsewhere classified LEJEND, DALBY ([id number]) E66.01 - Morbid (severe) obesity due to excess calories Plan Discharge From [facility] Services: Discharge from [place] you compression stockings. See your vascular doctor. Please call our office if you have any questions or concerns. the patient's wound is completely healed and after review today I have recommended: 1. he should commence wearing his compression stockings all day except for bedtime 2. Elevation and exercise 3. Adequate protein, vitamin A, vitamin C  and zinc 4. arterial duplex study and venous reflux study for both lower extremities -- results discussed with him. I have also recommended he sees the vascular surgeon in follow-up for further review of his results -- appointment on Monday 5. he is discharged from the wound care services and be seen back only if needed Electronic Signature(s) Signed: 02/17/2017 8:31:27 AM By: Christin Fudge MD, FACS Entered By: Christin Fudge on 02/17/2017 08:31:27 [name] ([id number]) -------------------------------------------------------------------------------- [place] Details Patient Name: [name] Date of Service: 02/17/2017 Medical Record Number: [id number] Patient Account Number: [id number] Date of Birth/Sex: 06/09/1950 (66 y.o. Male) Treating RN: Ahmed Prima Primary Care Provider: PATIENT, NO Other Clinician: Referring Provider: Referral, Self Treating Provider/Extender: Frann Rider in Treatment: 3 Diagnosis Coding ICD-10 Codes Code Description E11.622 Type 2 diabetes mellitus with other skin ulcer L97.221 Non-pressure chronic ulcer of left calf limited to breakdown of skin I87.312 Chronic venous hypertension (idiopathic) with ulcer of left lower extremity I89.0 Lymphedema, not elsewhere classified E66.01 Morbid (severe) obesity due to excess calories Facility Procedures CPT4 Code: [id number] Description: [phone] - WOUND CARE VISIT-LEV 2 EST PT Modifier: Quantity: 1 Physician Procedures CPT4 Code Description: [id number] - WC PHYS LEVEL 2 - EST PT ICD-10 Description Diagnosis E11.622 Type 2 diabetes mellitus with other skin ulcer L97.221 Non-pressure chronic ulcer of left calf limited to I87.312 Chronic venous hypertension  (idiopathic) with ulcer I89.0 Lymphedema, not elsewhere classified Modifier: breakdown of ski of left lower e Quantity: 1 n xtremity Electronic Signature(s) Signed: 02/17/2017 9:47:23 AM By: Alric Quan Signed: 02/17/2017 4:18:43 PM By:  Christin Fudge MD, FACS Previous Signature: 02/17/2017 8:31:41 AM Version By: Christin Fudge MD, FACS Entered By: Alric Quan on 02/17/2017 09:16:51

## 2017-03-05 NOTE — Progress Notes (Signed)
MRN : [id number]  [name] is a 66 y.o. (04/24/1951) male who presents with chief complaint of  Chief Complaint  Patient presents with  . Follow-up    ref [place] review results  .  History of Present Illness: The patient returns to the office for followup evaluation regarding leg swelling.  The swelling has persisted and the pain associated with swelling continues. There have not been any interval development of a ulcerations or wounds.  Since the previous visit the patient has been wearing graduated compression stockings and has noted little if any improvement in the lymphedema. The patient has been using compression routinely morning until night.  The patient also states elevation during the day and exercise is being done too.   Current Meds  Medication Sig  . bisoprolol-hydrochlorothiazide (ZIAC) 10-6.25 MG tablet Take 1 tablet by mouth daily.  . furosemide (LASIX) 20 MG tablet Take 20 mg by mouth.  Marland Kitchen glimepiride (AMARYL) 4 MG tablet   . hydrALAZINE (APRESOLINE) 10 MG tablet Take 10 mg by mouth 3 (three) times daily.  Marland Kitchen lisinopril-hydrochlorothiazide (PRINZIDE,ZESTORETIC) 20-12.5 MG tablet Take 1 tablet by mouth daily.  . metFORMIN (GLUCOPHAGE) 1000 MG tablet Take 1,000 mg by mouth 2 (two) times daily with a meal.  . pioglitazone (ACTOS) 15 MG tablet Take 15 mg by mouth daily.  . pravastatin (PRAVACHOL) 80 MG tablet Take 80 mg by mouth daily.    Past Medical History:  Diagnosis Date  . Diabetes mellitus without complication ([place])   . Hyperlipidemia   . Hypertension   . Leukemia [facility])     Past Surgical History:  Procedure Laterality Date  . CATARACT EXTRACTION Right   . TONSILLECTOMY    . TOOTH EXTRACTION      Social History Social History  Substance Use Topics  . Smoking status: Former Research scientist (life sciences)  . Smokeless tobacco: Never Used  . Alcohol use No    Family History Family History  Problem Relation Age of Onset  . Cancer Mother   . Stroke Father   . Cancer  Sister     No Known Allergies   REVIEW OF SYSTEMS (Negative unless checked)  Constitutional: [] Weight loss  [] Fever  [] Chills Cardiac: [] Chest pain   [] Chest pressure   [] Palpitations   [] Shortness of breath when laying flat   [] Shortness of breath with exertion. Vascular:  [] Pain in legs with walking   [x] Pain in legs with standing  [] History of DVT   [] Phlebitis   [x] Swelling in legs   [] Varicose veins   [] Non-healing ulcers Pulmonary:   [] Uses home oxygen   [] Productive cough   [] Hemoptysis   [] Wheeze  [] COPD   [] Asthma Neurologic:  [] Dizziness   [] Seizures   [] History of stroke   [] History of TIA  [] Aphasia   [] Vissual changes   [] Weakness or numbness in arm   [] Weakness or numbness in leg Musculoskeletal:   [] Joint swelling   [] Joint pain   [] Low back pain Hematologic:  [] Easy bruising  [] Easy bleeding   [] Hypercoagulable state   [] Anemic Gastrointestinal:  [] Diarrhea   [] Vomiting  [] Gastroesophageal reflux/heartburn   [] Difficulty swallowing. Genitourinary:  [] Chronic kidney disease   [] Difficult urination  [] Frequent urination   [] Blood in urine Skin:  [] Rashes   [] Ulcers  Psychological:  [] History of anxiety   []  History of major depression.  Physical Examination  Vitals:   02/20/17 1347  BP: 138/79  Pulse: 75  Resp: 16  Weight: (!) 325 lb 12.8 oz (147.8 kg)  Height:  6\' 1"  (1.854 m)   Body mass index is 42.98 kg/m. Gen: WD/WN, NAD Head: [place]/AT, No temporalis wasting.  Ear/Nose/Throat: Hearing grossly intact, nares w/o erythema or drainage Eyes: PER, EOMI, sclera nonicteric.  Neck: Supple, no large masses.   Pulmonary:  Good air movement, no audible wheezing bilaterally, no use of accessory muscles.  Cardiac: RRR, no JVD Vascular: scattered varicosities present bilaterally.  Severe venous stasis changes to the legs bilaterally.  3-4+ soft pitting edema Vessel Right Left  Radial Palpable Palpable  PT Palpable Palpable  DP Palpable Palpable  Gastrointestinal:  Non-distended. No guarding/no peritoneal signs.  Musculoskeletal: M/S 5/5 throughout.  No deformity or atrophy.  Neurologic: CN 2-12 intact. Symmetrical.  Speech is fluent. Motor exam as listed above. Psychiatric: Judgment intact, Mood & affect appropriate for pt's clinical situation. Dermatologic: No rashes or ulcers noted.  No changes consistent with cellulitis. Lymph : No lichenification or skin changes of chronic lymphedema.  CBC No results found for: WBC, HGB, HCT, MCV, PLT  BMET No results found for: NA, K, CL, CO2, GLUCOSE, BUN, CREATININE, CALCIUM, GFRNONAA, GFRAA CrCl cannot be calculated (No order found.).  COAG No results found for: INR, PROTIME  Radiology No results found.  Assessment/Plan 1. Lymphedema Recommend:  No surgery or intervention at this point in time.    I have reviewed my previous discussion with the patient regarding swelling and why it causes symptoms.  Patient will continue wearing graduated compression stockings class 1 (20-30 mmHg) on a daily basis. The patient will  beginning wearing the stockings first thing in the morning and removing them in the evening. The patient is instructed specifically not to sleep in the stockings.    In addition, behavioral modification including several periods of elevation of the lower extremities during the day will be continued.  This was reviewed with the patient during the initial visit.  The patient will also continue routine exercise, especially walking on a daily basis as was discussed during the initial visit.    Despite conservative treatments including graduated compression therapy class 1 and behavioral modification including exercise and elevation the patient  has not obtained adequate control of the lymphedema.  The patient still has stage 3 lymphedema and therefore, I believe that a lymph pump should be added to improve the control of the patient's lymphedema.  Additionally, a lymph pump is warranted because  it will reduce the risk of cellulitis and ulceration in the future.  Patient should follow-up in six months    2. Chronic venous insufficiency Recommend:  No surgery or intervention at this point in time.    I have reviewed my previous discussion with the patient regarding swelling and why it causes symptoms.  Patient will continue wearing graduated compression stockings class 1 (20-30 mmHg) on a daily basis. The patient will  beginning wearing the stockings first thing in the morning and removing them in the evening. The patient is instructed specifically not to sleep in the stockings.    In addition, behavioral modification including several periods of elevation of the lower extremities during the day will be continued.  This was reviewed with the patient during the initial visit.  The patient will also continue routine exercise, especially walking on a daily basis as was discussed during the initial visit.    Despite conservative treatments including graduated compression therapy class 1 and behavioral modification including exercise and elevation the patient  has not obtained adequate control of the lymphedema.  The patient still has  stage 3 lymphedema and therefore, I believe that a lymph pump should be added to improve the control of the patient's lymphedema.  Additionally, a lymph pump is warranted because it will reduce the risk of cellulitis and ulceration in the future.  Patient should follow-up in six months    3. Essential hypertension Continue antihypertensive medications as already ordered, these medications have been reviewed and there are no changes at this time.   4. Venous stasis dermatitis of both lower extremities See #1&2    Hortencia Pilar, MD  03/05/2017 3:50 PM

## 2017-05-18 DIAGNOSIS — E119 Type 2 diabetes mellitus without complications: Secondary | ICD-10-CM | POA: Diagnosis not present

## 2017-08-21 ENCOUNTER — Ambulatory Visit (INDEPENDENT_AMBULATORY_CARE_PROVIDER_SITE_OTHER): Payer: PPO | Admitting: Vascular Surgery

## 2018-05-23 DIAGNOSIS — Z961 Presence of intraocular lens: Secondary | ICD-10-CM | POA: Diagnosis not present

## 2018-11-12 ENCOUNTER — Other Ambulatory Visit: Payer: Self-pay

## 2018-11-12 ENCOUNTER — Encounter: Payer: PPO | Attending: Physician Assistant | Admitting: Physician Assistant

## 2018-11-12 DIAGNOSIS — I2510 Atherosclerotic heart disease of native coronary artery without angina pectoris: Secondary | ICD-10-CM | POA: Insufficient documentation

## 2018-11-12 DIAGNOSIS — I110 Hypertensive heart disease with heart failure: Secondary | ICD-10-CM | POA: Insufficient documentation

## 2018-11-12 DIAGNOSIS — I509 Heart failure, unspecified: Secondary | ICD-10-CM | POA: Insufficient documentation

## 2018-11-12 DIAGNOSIS — L97222 Non-pressure chronic ulcer of left calf with fat layer exposed: Secondary | ICD-10-CM | POA: Diagnosis not present

## 2018-11-12 DIAGNOSIS — I890 Lymphedema, not elsewhere classified: Secondary | ICD-10-CM | POA: Insufficient documentation

## 2018-11-12 DIAGNOSIS — Z87891 Personal history of nicotine dependence: Secondary | ICD-10-CM | POA: Insufficient documentation

## 2018-11-12 DIAGNOSIS — L97822 Non-pressure chronic ulcer of other part of left lower leg with fat layer exposed: Secondary | ICD-10-CM | POA: Diagnosis not present

## 2018-11-12 DIAGNOSIS — Z856 Personal history of leukemia: Secondary | ICD-10-CM | POA: Insufficient documentation

## 2018-11-12 DIAGNOSIS — E1142 Type 2 diabetes mellitus with diabetic polyneuropathy: Secondary | ICD-10-CM | POA: Diagnosis not present

## 2018-11-12 DIAGNOSIS — I872 Venous insufficiency (chronic) (peripheral): Secondary | ICD-10-CM | POA: Insufficient documentation

## 2018-11-12 DIAGNOSIS — Z8249 Family history of ischemic heart disease and other diseases of the circulatory system: Secondary | ICD-10-CM | POA: Insufficient documentation

## 2018-11-12 DIAGNOSIS — E1151 Type 2 diabetes mellitus with diabetic peripheral angiopathy without gangrene: Secondary | ICD-10-CM | POA: Diagnosis not present

## 2018-11-12 DIAGNOSIS — E11622 Type 2 diabetes mellitus with other skin ulcer: Secondary | ICD-10-CM | POA: Insufficient documentation

## 2018-11-12 NOTE — Progress Notes (Signed)
KEINO, PLACENCIA ([id number]) Visit Report for 11/12/2018 Abuse/Suicide Risk Screen Details Patient Name: [name], [name] Date of Service: 11/12/2018 9:45 AM Medical Record Number: [id number] Patient Account Number: [id number] Date of Birth/Sex: Jan 07, 1951 (68 y.o. M) Treating RN: Montey Hora Primary Care Jhalen Eley: SYSTEM, PCP Other Clinician: Referring Soffia Doshier: Referral, Self Treating Chriselda Leppert/Extender: STONE III, [name] Weeks in Treatment: 0 Abuse/Suicide Risk Screen Items Answer ABUSE RISK SCREEN: Has anyone close to you tried to hurt or harm you recentlyo No Do you feel uncomfortable with anyone in your familyo No Has anyone forced you do things that you didnot want to doo No Electronic Signature(s) Signed: 11/12/2018 3:32:36 PM By: Montey Hora Entered By: Montey Hora on 11/12/2018 10:09:46 [name] ([id number]) -------------------------------------------------------------------------------- Activities of Daily Living Details Patient Name: [name] Date of Service: 11/12/2018 9:45 AM Medical Record Number: [id number] Patient Account Number: [id number] Date of Birth/Sex: [birth year] (68 y.o. M) Treating RN: Montey Hora Primary Care Allyne Hebert: SYSTEM, PCP Other Clinician: Referring Kiegan Macaraeg: Referral, Self Treating Geneen Dieter/Extender: STONE III, [name] Weeks in Treatment: 0 Activities of Daily Living Items Answer Activities of Daily Living (Please select one for each item) Drive Automobile Completely Able Take Medications Completely Able Use Telephone Completely Able Care for Appearance Completely Able Use Toilet Completely Able Bath / Shower Completely Able Dress Self Completely Able Feed Self Completely Able Walk Completely Able Get In / Out Bed Completely Able Housework Completely Able Prepare Meals Completely [place] for Self Completely Able Electronic Signature(s) Signed: 11/12/2018 3:32:36 PM By: Montey Hora Entered By: Montey Hora on 11/12/2018 10:10:22 [name] ([id number]) -------------------------------------------------------------------------------- Education Screening Details Patient Name: [name] Date of Service: 11/12/2018 9:45 AM Medical Record Number: [id number] Patient Account Number: [id number] Date of Birth/Sex: 1950-11-08 (68 y.o. M) Treating RN: Montey Hora Primary Care Zachory Mangual: SYSTEM, PCP Other Clinician: Referring Bryant Saye: Referral, Self Treating Leahmarie Gasiorowski/Extender: Melburn Hake, [name] Weeks in Treatment: 0 Primary Learner Assessed: Patient Learning Preferences/Education Level/Primary Language Learning Preference: Explanation, Demonstration Highest Education Level: High School Preferred Language: English Cognitive Barrier Language Barrier: No Translator Needed: No Memory Deficit: No Emotional Barrier: No Cultural/Religious Beliefs Affecting Medical Care: No Physical Barrier Impaired Vision: No Impaired Hearing: No Decreased Hand dexterity: No Knowledge/Comprehension Knowledge Level: Medium Comprehension Level: Medium Ability to understand written Medium instructions: Ability to understand verbal Medium instructions: Motivation Anxiety Level: Calm Cooperation: Cooperative Education Importance: Acknowledges Need Interest in Health Problems: Asks Questions Perception: Coherent Willingness to Engage in Self- Medium Management Activities: Readiness to Engage in Self- Medium Management Activities: Electronic Signature(s) Signed: 11/12/2018 3:32:36 PM By: Montey Hora Entered By: Montey Hora on 11/12/2018 10:10:47 [name] ([id number]) -------------------------------------------------------------------------------- Fall Risk Assessment Details Patient Name: [name] Date of Service: 11/12/2018 9:45 AM Medical Record Number: [id number] Patient Account Number: [id number] Date of Birth/Sex: 1951/04/17 (68 y.o. M) Treating RN: Montey Hora Primary Care  Ceylon Arenson: SYSTEM, PCP Other Clinician: Referring Demonte Dobratz: Referral, Self Treating Kamiryn Bezanson/Extender: STONE III, [name] Weeks in Treatment: 0 Fall Risk Assessment Items Have you had 2 or more falls in the last 12 monthso 0 No Have you had any fall that resulted in injury in the last 12 monthso 0 No FALLS RISK SCREEN History of falling - immediate or within 3 months 0 No Secondary diagnosis (Do you have 2 or more medical diagnoseso) 0 No Ambulatory aid None/bed rest/wheelchair/nurse 0 Yes Crutches/cane/walker 0 No Furniture 0 No Intravenous therapy Access/Saline/Heparin Lock 0 No Gait/Transferring Normal/ bed rest/ wheelchair 0 No Weak (short steps with or without shuffle, stooped but  able to lift head while 10 Yes walking, may seek support from furniture) Impaired (short steps with shuffle, may have difficulty arising from chair, head 0 No down, impaired balance) Mental Status Oriented to own ability 0 Yes Electronic Signature(s) Signed: 11/12/2018 3:32:36 PM By: Montey Hora Entered By: Montey Hora on 11/12/2018 10:10:59 [name] ([id number]) -------------------------------------------------------------------------------- Foot Assessment Details Patient Name: [name] Date of Service: 11/12/2018 9:45 AM Medical Record Number: [id number] Patient Account Number: [id number] Date of Birth/Sex: Feb 11, 1951 (68 y.o. M) Treating RN: Montey Hora Primary Care Anisa Leanos: SYSTEM, PCP Other Clinician: Referring Affie Gasner: Referral, Self Treating Gerlene Glassburn/Extender: STONE III, [name] Weeks in Treatment: 0 Foot Assessment Items Site Locations + = Sensation present, - = Sensation absent, C = Callus, U = Ulcer R = Redness, W = Warmth, M = Maceration, PU = Pre-ulcerative lesion F = Fissure, S = Swelling, D = Dryness Assessment Right: Left: Other Deformity: No No Prior Foot Ulcer: No No Prior Amputation: No No Charcot Joint: No No Ambulatory Status: Ambulatory Without Help Gait:  Steady Electronic Signature(s) Signed: 11/12/2018 3:32:36 PM By: Montey Hora Entered By: Montey Hora on 11/12/2018 10:12:44 [name] ([id number]) -------------------------------------------------------------------------------- Nutrition Risk Screening Details Patient Name: [name] Date of Service: 11/12/2018 9:45 AM Medical Record Number: [id number] Patient Account Number: [id number] Date of Birth/Sex: 05-Oct-1950 (68 y.o. M) Treating RN: Montey Hora Primary Care Kaavya Puskarich: SYSTEM, PCP Other Clinician: Referring Filomena Pokorney: Referral, Self Treating Hennessy Bartel/Extender: STONE III, [name] Weeks in Treatment: 0 Height (in): 73 Weight (lbs): 300 Body Mass Index (BMI): 39.6 Nutrition Risk Screening Items Score Screening NUTRITION RISK SCREEN: I have an illness or condition that made me change the kind and/or amount of 0 No food I eat I eat fewer than two meals per day 0 No I eat few fruits and vegetables, or milk products 0 No I have three or more drinks of beer, liquor or wine almost every day 0 No I have tooth or mouth problems that make it hard for me to eat 0 No I don't always have enough money to buy the food I need 0 No I eat alone most of the time 0 No I take three or more different prescribed or over-the-counter drugs a day 1 Yes Without wanting to, I have lost or gained 10 pounds in the last six months 0 No I am not always physically able to shop, cook and/or feed myself 0 No Nutrition Protocols Good Risk Protocol 0 No interventions needed Moderate Risk Protocol High Risk Proctocol Risk Level: Good Risk Score: 1 Electronic Signature(s) Signed: 11/12/2018 3:32:36 PM By: Montey Hora Entered By: Montey Hora on 11/12/2018 10:11:08

## 2018-11-14 NOTE — Progress Notes (Signed)
GIOVANNI, BIBY ([id number]) Visit Report for 11/12/2018 Chief Complaint Document Details Patient Name: [name], [name] Date of Service: 11/12/2018 9:45 AM Medical Record Number: [id number] Patient Account Number: [id number] Date of Birth/Sex: Aug 10, 1950 (68 y.o. M) Treating RN: Primary Care Provider: SYSTEM, PCP Other Clinician: Referring Provider: Referral, Self Treating Provider/Extender: STONE III, [name] Weeks in Treatment: 0 Information Obtained from: Patient Chief Complaint Left LE ulcers Electronic Signature(s) Signed: 11/14/2018 1:30:00 AM By: Worthy Keeler PA-C Entered By: Worthy Keeler on 11/12/2018 10:36:41 [name] ([id number]) -------------------------------------------------------------------------------- HPI Details Patient Name: [name] Date of Service: 11/12/2018 9:45 AM Medical Record Number: [id number] Patient Account Number: [id number] Date of Birth/Sex: Mar 24, 1951 (68 y.o. M) Treating RN: Primary Care Provider: SYSTEM, PCP Other Clinician: Referring Provider: Referral, Self Treating Provider/Extender: STONE III, [name] Weeks in Treatment: 0 History of Present Illness HPI Description: 68 year old male who presented to the ER with bilateral lower extremity blisters which had started last week. he has a past medical history of leukemia, diabetes mellitus, hypertension, edema of both lower extremities, his recurrent skin infections, peripheral vascular disease, coronary artery disease, congestive heart failure and peripheral neuropathy. in the ER he was given Rocephin and put on Silvadene cream. he was put on oral doxycycline and was asked to follow-up with the [facility]. His last hemoglobin A1c was 6.6 in December and he checks his blood sugar once a week. He does not have any physicians outside the [place] system. He does not recall any vascular duplex studies done either for arterial or venous disease but was told to wear compression stockings which he does not  use 05/30/2016 -- we have not yet received any of his notes from the [facility] hospital system and his arterial and venous duplex studies are scheduled here in [place] around mid February. We are unable to have his insurance accepted by home health agencies and hence he is getting dressings only once a week. 06/06/16 -- -- I received a call from the patient's PCP at the [facility] at [facility] and spoke to Dr. Garvin Fila, phone number [phone] and fax number [phone]. She confirmed that no vascular testing was done over the last 5 years and she would be happy to do them if the patient did want them to be done at the [place] and we could fax him a request. Readmission: 68 year old male seen by as in February of this year and was referred to vein and vascular for studies and opinion from the vascular surgeons. The patient returns today with a fresh problem having had blisters on his left lower extremity which have been there for about 5 days and he clearly states that he has been wearing his compression stockings as advised though he could not read the moderate compression and has been wearing light compression. Review of his electronic medical records note that he had lower extremity arterial duplex examination done on 06/23/2016 which showed no hemodynamically significant stenosis in the bilateral lower extremity arterial system. He also had a lower extremity venous reflux examination done on 07/07/2016 and it was noted that he had venous incompetence in the right great saphenous vein and bilateral common femoral veins. Patient was seen by Dr. Tamala Julian on the same day and for some reason his notes do not reflect the venous studies or the arterial studies and he recommended patient do a venous duplex ultrasound to look for reflux and return to see him.he would also consider a lymph pump if required. The patient was told that his workup was normal  and hence the patient canceled his follow-up  appointment. 02/03/17 on evaluation today patient left medial lower extremity blister appears to be doing about the same. It is still continuing to drain and there's still the blistered skin covering the wound bed which is making it difficult for the alternate to do its job. Fortunately there is no evidence of cellulitis. No fevers chills noted. Patient states in general he is not having any significant discomfort. Patient's lower extremity arterial duplex exam revealed that patient was hemodynamically stable with no evidence of stenosis in regard to the bilateral lower extremities. The lower extremity venous reflux exam revealed the patient had venous incontinence noted in the right greater saphenous and bilateral common femoral vein. There is no evidence of deep or superficial vein thrombosis in the bilateral lower extremities. Readmission: [name], [name] ([id number]) 11/12/18 Patient presents for evaluation our clinic today concerning issues that he is having with his left lower extremity. He tells me that a couple weeks ago he began developing blisters on the left lower extremity along with increased swelling. He typically wears his compression stockings on a regular basis is previously been evaluated both here as well is with vascular surgery they would recommend lymphedema pumps but unfortunately that somehow fell through and he never heard anything back from that. Nonetheless I think lymphedema pumps would be beneficial for this patient. He does have a history of hypertension and diabetes. Obviously the chronic venous stasis and lymphedema as well. At this point the blisters have been given in more trouble he states sometimes when the blisters openings able to clean it down with alcohol and it will dry out and do well. Unfortunately that has not been the case this time. He is having some discomfort although this mean these with cleaning the areas he doesn't have discomfort just on a regular  basis. He has not been able to wear his compression stockings since the blisters arose due to the fact that of course it will drain into the socks causing additional issues and he didn't have any way to wrap this otherwise. He has increased to taking his Lasix every day instead of every other day. He sees his primary care provider later this month as well. No fevers, chills, nausea, or vomiting noted at this time. Electronic Signature(s) Signed: 11/14/2018 1:30:00 AM By: Worthy Keeler PA-C Entered By: Worthy Keeler on 11/12/2018 11:00:12 [name] ([id number]) -------------------------------------------------------------------------------- Physical Exam Details Patient Name: [name], [name] Date of Service: 11/12/2018 9:45 AM Medical Record Number: [id number] Patient Account Number: [id number] Date of Birth/Sex: 10/13/1950 (68 y.o. M) Treating RN: Harold Barban Primary Care Provider: SYSTEM, PCP Other Clinician: Referring Provider: Referral, Self Treating Provider/Extender: STONE III, [name] Weeks in Treatment: 0 Constitutional sitting or standing blood pressure is within target range for patient.. pulse regular and within target range for patient.Marland Kitchen respirations regular, non-labored and within target range for patient.Marland Kitchen temperature within target range for patient.. Well- nourished and well-hydrated in no acute distress. Eyes conjunctiva clear no eyelid edema noted. pupils equal round and reactive to light and accommodation. Ears, Nose, Mouth, and Throat no gross abnormality of ear auricles or external auditory canals. normal hearing noted during conversation. mucus membranes moist. Respiratory normal breathing without difficulty. clear to auscultation bilaterally. Cardiovascular regular rate and rhythm with normal S1, S2. 2+ dorsalis pedis/posterior tibialis pulses. no clubbing, cyanosis, significant edema, <3 sec cap refill. Gastrointestinal (GI) soft, non-tender, non-distended,  +BS. no ventral hernia noted. Musculoskeletal normal gait and posture. no significant deformity  or arthritic changes, no loss or range of motion, no clubbing. Psychiatric this patient is able to make decisions and demonstrates good insight into disease process. Alert and Oriented x 3. pleasant and cooperative. Notes On inspection today patient's wounds actually appeared to be doing okay and that they're not too deep. With that being said he is having some issues with quite a bit of weeping and drainage from the leg secondary to the fact is not been able to wear his compression stockings due to the open wounds themselves. Obviously this is a issue that kind of continues to cause ongoing complications since the increased swelling propagates more drainage the more drainage he has he can't wears compression stockings. Prior to this he is been extremely compliant with his compression stockings his predeceasing vascular with a recommend lymphedema pumps. With that being said he states that was never completely follow through with. He does try to elevate his legs as much as possible as well obviously he is retired so he doesn't have to work actively although he still has a lot of stuff around the house to do he tells me. Prior to getting wounds however he was compression stockings at all times he still has one on the right given that he cannot wear it on the left. Electronic Signature(s) Signed: 11/14/2018 1:30:00 AM By: Worthy Keeler PA-C Entered By: Worthy Keeler on 11/12/2018 15:18:44 [name] ([id number]) -------------------------------------------------------------------------------- Physician Orders Details Patient Name: [name] Date of Service: 11/12/2018 9:45 AM Medical Record Number: [id number] Patient Account Number: [id number] Date of Birth/Sex: Sep 15, 1950 (68 y.o. M) Treating RN: Harold Barban Primary Care Provider: SYSTEM, PCP Other Clinician: Referring Provider: Referral,  Self Treating Provider/Extender: STONE III, [name] Weeks in Treatment: 0 Verbal / Phone Orders: No Diagnosis Coding ICD-10 Coding Code Description E11.622 Type 2 diabetes mellitus with other skin ulcer I89.0 Lymphedema, not elsewhere classified I87.2 Venous insufficiency (chronic) (peripheral) L97.822 Non-pressure chronic ulcer of other part of left lower leg with fat layer exposed I10 Essential (primary) hypertension Wound Cleansing Wound #4 Left,Medial Lower Leg o May shower with protection. - Can cover with bag and tape tightly so not to leak or purchase a cast protector to keep wrap dry Wound #5 Left,Proximal,Lateral Lower Leg o May shower with protection. - Can cover with bag and tape tightly so not to leak or purchase a cast protector to keep wrap dry Wound #6 Left,Lateral Lower Leg o May shower with protection. - Can cover with bag and tape tightly so not to leak or purchase a cast protector to keep wrap dry Wound #7 Left,Distal,Lateral Lower Leg o May shower with protection. - Can cover with bag and tape tightly so not to leak or purchase a cast protector to keep wrap dry Primary Wound Dressing Wound #4 Left,Medial Lower Leg o Silver Alginate Wound #5 Left,Proximal,Lateral Lower Leg o Silver Alginate Wound #6 Left,Lateral Lower Leg o Silver Alginate Wound #7 Left,Distal,Lateral Lower Leg o Silver Alginate Secondary Dressing Wound #4 Left,Medial Lower Leg o XtraSorb Wound #5 Left,Proximal,Lateral Lower Leg [name], [name] ([id number]) o XtraSorb Wound #6 Left,Lateral Lower Leg o XtraSorb Wound #7 Left,Distal,Lateral Lower Leg o XtraSorb Follow-up Appointments Wound #4 Left,Medial Lower Leg o Return Appointment in 1 week. - For dressing and wrap change on Thursday July 9th for Nurse Visit to re-wrap leg Wound #5 Left,Proximal,Lateral Lower Leg o Return Appointment in 1 week. - For dressing and wrap change on Thursday July 9th for Nurse  Visit to re-wrap leg Wound #6  Left,Lateral Lower Leg o Return Appointment in 1 week. - For dressing and wrap change on Thursday July 9th for Nurse Visit to re-wrap leg Wound #7 Left,Distal,Lateral Lower Leg o Return Appointment in 1 week. - For dressing and wrap change on Thursday July 9th for Nurse Visit to re-wrap leg Edema Control Wound #4 Left,Medial Lower Leg o 3 Layer Compression System - Left Lower Extremity o Unna Boot to Left Lower Extremity - Unna only at top to anchor o Compression Pump: Use compression pump on left lower extremity for 60 minutes, twice daily. - Wound Care will order pumps for Patient o Compression Pump: Use compression pump on right lower extremity for 60 minutes, twice daily. - Wound Care will order pumps for Patient Wound #5 Left,Proximal,Lateral Lower Leg o 3 Layer Compression System - Left Lower Extremity o Unna Boot to Left Lower Extremity - Unna only at top to anchor Wound #6 Left,Lateral Lower Leg o 3 Layer Compression System - Left Lower Extremity o Unna Boot to Left Lower Extremity - Unna only at top to anchor Wound #7 Left,Distal,Lateral Lower Leg o 3 Layer Compression System - Left Lower Extremity o Unna Boot to Left Lower Extremity - Unna only at top to anchor Off-Loading Wound #4 Left,Medial Lower Leg o Other: - Elevate leg while sitting, to aid in the reduction of swelling. Wound #5 Left,Proximal,Lateral Lower Leg o Other: - Elevate leg while sitting, to aid in the reduction of swelling. Wound #6 Left,Lateral Lower Leg o Other: - Elevate leg while sitting, to aid in the reduction of swelling. Wound #7 Left,Distal,Lateral Lower Leg o Other: - Elevate leg while sitting, to aid in the reduction of swelling. [name], [name] ([id number]) Electronic Signature(s) Signed: 11/12/2018 4:36:57 PM By: Harold Barban Signed: 11/14/2018 1:30:00 AM By: Worthy Keeler PA-C Entered By: Harold Barban on 11/12/2018  10:55:51 [name] ([id number]) -------------------------------------------------------------------------------- Problem List Details Patient Name: [name] Date of Service: 11/12/2018 9:45 AM Medical Record Number: [id number] Patient Account Number: [id number] Date of Birth/Sex: 1950/06/02 (68 y.o. M) Treating RN: Primary Care Provider: SYSTEM, PCP Other Clinician: Referring Provider: Referral, Self Treating Provider/Extender: STONE III, [name] Weeks in Treatment: 0 Active Problems ICD-10 Evaluated Encounter Code Description Active Date Today Diagnosis E11.622 Type 2 diabetes mellitus with other skin ulcer 11/12/2018 No Yes I89.0 Lymphedema, not elsewhere classified 11/12/2018 No Yes I87.2 Venous insufficiency (chronic) (peripheral) 11/12/2018 No Yes L97.822 Non-pressure chronic ulcer of other part of left lower leg with 11/12/2018 No Yes fat layer exposed I10 Essential (primary) hypertension 11/12/2018 No Yes Inactive Problems Resolved Problems Electronic Signature(s) Signed: 11/14/2018 1:30:00 AM By: Worthy Keeler PA-C Entered By: Worthy Keeler on 11/12/2018 10:36:09 [name] ([id number]) -------------------------------------------------------------------------------- Progress Note Details Patient Name: [name] Date of Service: 11/12/2018 9:45 AM Medical Record Number: [id number] Patient Account Number: [id number] Date of Birth/Sex: 1950-05-19 (68 y.o. M) Treating RN: Harold Barban Primary Care Provider: SYSTEM, PCP Other Clinician: Referring Provider: Referral, Self Treating Provider/Extender: STONE III, [name] Weeks in Treatment: 0 Subjective Chief Complaint Information obtained from Patient Left LE ulcers History of Present Illness (HPI) 68 year old male who presented to the ER with bilateral lower extremity blisters which had started last week. he has a past medical history of leukemia, diabetes mellitus, hypertension, edema of both lower extremities, his  recurrent skin infections, peripheral vascular disease, coronary artery disease, congestive heart failure and peripheral neuropathy. in the ER he was given Rocephin and put on Silvadene cream. he was put on oral doxycycline and was  asked to follow-up with the [facility]. His last hemoglobin A1c was 6.6 in December and he checks his blood sugar once a week. He does not have any physicians outside the [place] system. He does not recall any vascular duplex studies done either for arterial or venous disease but was told to wear compression stockings which he does not use 05/30/2016 -- we have not yet received any of his notes from the [facility] hospital system and his arterial and venous duplex studies are scheduled here in [place] around mid February. We are unable to have his insurance accepted by home health agencies and hence he is getting dressings only once a week. 06/06/16 -- -- I received a call from the patient's PCP at the [facility] at [facility] and spoke to Dr. Garvin Fila, phone number [phone] and fax number [phone]. She confirmed that no vascular testing was done over the last 5 years and she would be happy to do them if the patient did want them to be done at the [place] and we could fax him a request. Readmission: 68 year old male seen by as in February of this year and was referred to vein and vascular for studies and opinion from the vascular surgeons. The patient returns today with a fresh problem having had blisters on his left lower extremity which have been there for about 5 days and he clearly states that he has been wearing his compression stockings as advised though he could not read the moderate compression and has been wearing light compression. Review of his electronic medical records note that he had lower extremity arterial duplex examination done on 06/23/2016 which showed no hemodynamically significant stenosis in the bilateral lower extremity arterial system. He also had a lower  extremity venous reflux examination done on 07/07/2016 and it was noted that he had venous incompetence in the right great saphenous vein and bilateral common femoral veins. Patient was seen by Dr. Tamala Julian on the same day and for some reason his notes do not reflect the venous studies or the arterial studies and he recommended patient do a venous duplex ultrasound to look for reflux and return to see him.he would also consider a lymph pump if required. The patient was told that his workup was normal and hence the patient canceled his follow-up appointment. 02/03/17 on evaluation today patient left medial lower extremity blister appears to be doing about the same. It is still continuing to drain and there's still the blistered skin covering the wound bed which is making it difficult for the alternate to do its job. Fortunately there is no evidence of cellulitis. No fevers chills noted. Patient states in general he is not having any significant discomfort. Patient's lower extremity arterial duplex exam revealed that patient was hemodynamically stable with no evidence of stenosis in regard to the bilateral lower extremities. The lower extremity venous reflux exam revealed the patient had venous incontinence noted in the right greater saphenous [name], [name] ([id number]) and bilateral common femoral vein. There is no evidence of deep or superficial vein thrombosis in the bilateral lower extremities. Readmission: 11/12/18 Patient presents for evaluation our clinic today concerning issues that he is having with his left lower extremity. He tells me that a couple weeks ago he began developing blisters on the left lower extremity along with increased swelling. He typically wears his compression stockings on a regular basis is previously been evaluated both here as well is with vascular surgery they would recommend lymphedema pumps but unfortunately that somehow fell through  and he never heard anything back  from that. Nonetheless I think lymphedema pumps would be beneficial for this patient. He does have a history of hypertension and diabetes. Obviously the chronic venous stasis and lymphedema as well. At this point the blisters have been given in more trouble he states sometimes when the blisters openings able to clean it down with alcohol and it will dry out and do well. Unfortunately that has not been the case this time. He is having some discomfort although this mean these with cleaning the areas he doesn't have discomfort just on a regular basis. He has not been able to wear his compression stockings since the blisters arose due to the fact that of course it will drain into the socks causing additional issues and he didn't have any way to wrap this otherwise. He has increased to taking his Lasix every day instead of every other day. He sees his primary care provider later this month as well. No fevers, chills, nausea, or vomiting noted at this time. Patient History Information obtained from Patient. Allergies No Known Drug Allergies Family History Cancer - Mother, Stroke - Father, No family history of Diabetes, Heart Disease, Hereditary Spherocytosis, Hypertension, Kidney Disease, Lung Disease, Seizures, Thyroid Problems, Tuberculosis. Social History Former smoker - quit 5 years ago, Marital Status - Divorced, Alcohol Use - Never, Drug Use - No History, Caffeine Use - Daily. Medical History Cardiovascular Patient has history of Hypertension, Peripheral Venous Disease Denies history of Angina, Arrhythmia, Congestive Heart Failure, Coronary Artery Disease, Deep Vein Thrombosis, Hypotension, Myocardial Infarction, Peripheral Arterial Disease, Phlebitis, Vasculitis Endocrine Patient has history of Type II Diabetes Integumentary (Skin) Denies history of History of Burn, History of pressure wounds Neurologic Patient has history of Neuropathy Denies history of Dementia, Quadriplegia,  Paraplegia, Seizure Disorder Oncologic Patient has history of Received Chemotherapy Denies history of Received Radiation Medical And Surgical History Notes 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 Eyes Denies complaints or symptoms of Dry Eyes, Vision Changes, Glasses / Contacts. [name], [name] ([id number]) Ear/Nose/Mouth/Throat Denies complaints or symptoms of Difficult clearing ears, Sinusitis. Hematologic/Lymphatic Denies complaints or symptoms of Bleeding / Clotting Disorders, Human Immunodeficiency Virus. Respiratory Denies complaints or symptoms of Chronic or frequent coughs, Shortness of Breath. Cardiovascular Complains or has symptoms of LE edema. Denies complaints or symptoms of Chest pain. Gastrointestinal Denies complaints or symptoms of Frequent diarrhea, Nausea, Vomiting. Endocrine Denies complaints or symptoms of Hepatitis, Thyroid disease, Polydypsia (Excessive Thirst). Genitourinary Denies complaints or symptoms of Kidney failure/ Dialysis, Incontinence/dribbling. Immunological Denies complaints or symptoms of Hives, Itching. Integumentary (Skin) Complains or has symptoms of Wounds, Swelling. Denies complaints or symptoms of Bleeding or bruising tendency, Breakdown. Musculoskeletal Denies complaints or symptoms of Muscle Pain, Muscle Weakness. Neurologic Denies complaints or symptoms of Numbness/parasthesias, Focal/Weakness. Psychiatric Denies complaints or symptoms of Anxiety, Claustrophobia. Objective Constitutional sitting or standing blood pressure is within target range for patient.. pulse regular and within target range for patient.Marland Kitchen respirations regular, non-labored and within target range for patient.Marland Kitchen temperature within target range for patient.. Well- nourished and well-hydrated in no acute  distress. Vitals Time Taken: 10:02 AM, Height: 73 in, Source: Measured, Weight: 300 lbs, Source: Measured, BMI: 39.6, Temperature: 98.3 F, Pulse: 56 bpm, Respiratory Rate: 18 breaths/min, Blood Pressure: 117/57 mmHg. Eyes conjunctiva clear no eyelid edema noted. pupils equal round and reactive to light and accommodation. Ears, Nose, Mouth, and Throat  no gross abnormality of ear auricles or external auditory canals. normal hearing noted during conversation. mucus membranes moist. Respiratory normal breathing without difficulty. clear to auscultation bilaterally. Cardiovascular regular rate and rhythm with normal S1, S2. 2+ dorsalis pedis/posterior tibialis pulses. no clubbing, cyanosis, significant edema, [name], [name] ([id number]) Gastrointestinal (GI) soft, non-tender, non-distended, +BS. no ventral hernia noted. Musculoskeletal normal gait and posture. no significant deformity or arthritic changes, no loss or range of motion, no clubbing. Psychiatric this patient is able to make decisions and demonstrates good insight into disease process. Alert and Oriented x 3. pleasant and cooperative. General Notes: On inspection today patient's wounds actually appeared to be doing okay and that they're not too deep. With that being said he is having some issues with quite a bit of weeping and drainage from the leg secondary to the fact is not been able to wear his compression stockings due to the open wounds themselves. Obviously this is a issue that kind of continues to cause ongoing complications since the increased swelling propagates more drainage the more drainage he has he can't wears compression stockings. Prior to this he is been extremely compliant with his compression stockings his predeceasing vascular with a recommend lymphedema pumps. With that being said he states that was never completely follow through with. He does try to elevate his legs as much as possible as well obviously he is  retired so he doesn't have to work actively although he still has a lot of stuff around the house to do he tells me. Prior to getting wounds however he was compression stockings at all times he still has one on the right given that he cannot wear it on the left. Integumentary (Hair, Skin) Wound #4 status is Open. Original cause of wound was Blister. The wound is located on the Left,Medial Lower Leg. The wound measures 0.9cm length x 1cm width x 0.1cm depth; 0.707cm^2 area and 0.071cm^3 volume. There is Fat Layer (Subcutaneous Tissue) Exposed exposed. There is no tunneling or undermining noted. There is a large amount of serous drainage noted. The wound margin is flat and intact. There is large (67-100%) pink granulation within the wound bed. There is a small (1-33%) amount of necrotic tissue within the wound bed including Adherent Slough. Wound #5 status is Open. Original cause of wound was Blister. The wound is located on the Left,Proximal,Lateral Lower Leg. The wound measures 3.4cm length x 7.5cm width x 0.1cm depth; 20.028cm^2 area and 2.003cm^3 volume. There is Fat Layer (Subcutaneous Tissue) Exposed exposed. There is no tunneling or undermining noted. There is a large amount of serous drainage noted. The wound margin is flat and intact. There is medium (34-66%) pink granulation within the wound bed. There is a medium (34-66%) amount of necrotic tissue within the wound bed including Adherent Slough. Wound #6 status is Open. Original cause of wound was Blister. The wound is located on the Left,Lateral Lower Leg. The wound measures 7.5cm length x 3cm width x 0.1cm depth; 17.671cm^2 area and 1.767cm^3 volume. There is Fat Layer (Subcutaneous Tissue) Exposed exposed. There is no tunneling or undermining noted. There is a large amount of serous drainage noted. The wound margin is flat and intact. There is medium (34-66%) pink granulation within the wound bed. There is a medium (34-66%) amount of  necrotic tissue within the wound bed including Adherent Slough. Wound #7 status is Open. Original cause of wound was Blister. The wound is located on the Left,Distal,Lateral Lower Leg. The wound measures 5cm length x  5.5cm width x 0.1cm depth; 21.598cm^2 area and 2.16cm^3 volume. There is Fat Layer (Subcutaneous Tissue) Exposed exposed. There is no tunneling or undermining noted. There is a large amount of serous drainage noted. The wound margin is flat and intact. There is medium (34-66%) pink granulation within the wound bed. There is a medium (34-66%) amount of necrotic tissue within the wound bed including Adherent Slough. Assessment Active Problems ICD-10 Type 2 diabetes mellitus with other skin ulcer Lymphedema, not elsewhere classified Venous insufficiency (chronic) (peripheral) Non-pressure chronic ulcer of other part of left lower leg with fat layer exposed Essential (primary) hypertension [name], [name] ([id number]) Plan Wound Cleansing: Wound #4 Left,Medial Lower Leg: May shower with protection. - Can cover with bag and tape tightly so not to leak or purchase a cast protector to keep wrap dry Wound #5 Left,Proximal,Lateral Lower Leg: May shower with protection. - Can cover with bag and tape tightly so not to leak or purchase a cast protector to keep wrap dry Wound #6 Left,Lateral Lower Leg: May shower with protection. - Can cover with bag and tape tightly so not to leak or purchase a cast protector to keep wrap dry Wound #7 Left,Distal,Lateral Lower Leg: May shower with protection. - Can cover with bag and tape tightly so not to leak or purchase a cast protector to keep wrap dry Primary Wound Dressing: Wound #4 Left,Medial Lower Leg: Silver Alginate Wound #5 Left,Proximal,Lateral Lower Leg: Silver Alginate Wound #6 Left,Lateral Lower Leg: Silver Alginate Wound #7 Left,Distal,Lateral Lower Leg: Silver Alginate Secondary Dressing: Wound #4 Left,Medial Lower  Leg: XtraSorb Wound #5 Left,Proximal,Lateral Lower Leg: XtraSorb Wound #6 Left,Lateral Lower Leg: XtraSorb Wound #7 Left,Distal,Lateral Lower Leg: XtraSorb Follow-up Appointments: Wound #4 Left,Medial Lower Leg: Return Appointment in 1 week. - For dressing and wrap change on Thursday July 9th for Nurse Visit to re-wrap leg Wound #5 Left,Proximal,Lateral Lower Leg: Return Appointment in 1 week. - For dressing and wrap change on Thursday July 9th for Nurse Visit to re-wrap leg Wound #6 Left,Lateral Lower Leg: Return Appointment in 1 week. - For dressing and wrap change on Thursday July 9th for Nurse Visit to re-wrap leg Wound #7 Left,Distal,Lateral Lower Leg: Return Appointment in 1 week. - For dressing and wrap change on Thursday July 9th for Nurse Visit to re-wrap leg Edema Control: Wound #4 Left,Medial Lower Leg: 3 Layer Compression System - Left Lower Extremity Unna Boot to Left Lower Extremity - Unna only at top to anchor Compression Pump: Use compression pump on left lower extremity for 60 minutes, twice daily. - Wound Care will order pumps for Patient Compression Pump: Use compression pump on right lower extremity for 60 minutes, twice daily. - Wound Care will order pumps for Patient Wound #5 Left,Proximal,Lateral Lower Leg: 3 Layer Compression System - Left Lower Extremity Unna Boot to Left Lower Extremity - Unna only at top to anchor [name], [name] ([id number]) Wound #6 Left,Lateral Lower Leg: 3 Layer Compression System - Left Lower Extremity Unna Boot to Left Lower Extremity - Unna only at top to anchor Wound #7 Left,Distal,Lateral Lower Leg: 3 Layer Compression System - Left Lower Extremity Unna Boot to Left Lower Extremity - Unna only at top to anchor Off-Loading: Wound #4 Left,Medial Lower Leg: Other: - Elevate leg while sitting, to aid in the reduction of swelling. Wound #5 Left,Proximal,Lateral Lower Leg: Other: - Elevate leg while sitting, to aid in the reduction of  swelling. Wound #6 Left,Lateral Lower Leg: Other: - Elevate leg while sitting, to aid in the  reduction of swelling. Wound #7 Left,Distal,Lateral Lower Leg: Other: - Elevate leg while sitting, to aid in the reduction of swelling. The patient does have stage II lymphedema which I think would benefit from lymphedema pumps. Again prior to developing the wounds currently he has been wearing compression stockings on a regular basis for the past two years since we last saw him. Compression/lymphedema pumps were previously recommended for him by vascular although for some reason they were never fully executed. Nonetheless I think that he would be a candidate for lymphedema pumps at this point due to persistent symptoms and the fact that even with wearing compression stockings he still tends to develop blisters and ulcerations. I suggested that he continue to walk and exercise as much as possible, elevate his legs as needed, and we are gonna initiate a compression wrap for him today. We will subsequently see were things stand at follow-up. Please see above for specific wound care orders. We will see patient for re-evaluation in 1 week(s) here in the clinic. If anything worsens or changes patient will contact our office for additional recommendations. Electronic Signature(s) Signed: 11/14/2018 1:30:00 AM By: Worthy Keeler PA-C Entered By: Worthy Keeler on 11/12/2018 15:19:52 [name] ([id number]) -------------------------------------------------------------------------------- ROS/PFSH Details Patient Name: [name] Date of Service: 11/12/2018 9:45 AM Medical Record Number: [id number] Patient Account Number: [id number] Date of Birth/Sex: Aug 23, 1950 (68 y.o. M) Treating RN: Montey Hora Primary Care Provider: SYSTEM, PCP Other Clinician: Referring Provider: Referral, Self Treating Provider/Extender: STONE III, [name] Weeks in Treatment: 0 Information Obtained From Patient Constitutional  Symptoms (General Health) Complaints and Symptoms: Negative for: Fatigue; Fever; Chills; Marked Weight Change Eyes Complaints and Symptoms: Negative for: Dry Eyes; Vision Changes; Glasses / Contacts Ear/Nose/Mouth/Throat Complaints and Symptoms: Negative for: Difficult clearing ears; Sinusitis Hematologic/Lymphatic Complaints and Symptoms: Negative for: Bleeding / Clotting Disorders; Human Immunodeficiency Virus Respiratory Complaints and Symptoms: Negative for: Chronic or frequent coughs; Shortness of Breath Cardiovascular Complaints and Symptoms: Positive for: LE edema Negative for: Chest pain Medical History: Positive for: Hypertension; Peripheral Venous Disease Negative for: Angina; Arrhythmia; Congestive Heart Failure; Coronary Artery Disease; Deep Vein Thrombosis; Hypotension; Myocardial Infarction; Peripheral Arterial Disease; Phlebitis; Vasculitis Gastrointestinal Complaints and Symptoms: Negative for: Frequent diarrhea; Nausea; Vomiting Endocrine Complaints and Symptoms: Negative for: Hepatitis; Thyroid disease; Polydypsia (Excessive Thirst) Medical History: Positive for: Type II Diabetes [name], [name] ([id number]) Treated with: Oral agents Genitourinary Complaints and Symptoms: Negative for: Kidney failure/ Dialysis; Incontinence/dribbling Immunological Complaints and Symptoms: Negative for: Hives; Itching Integumentary (Skin) Complaints and Symptoms: Positive for: Wounds; Swelling Negative for: Bleeding or bruising tendency; Breakdown Medical History: Negative for: History of Burn; History of pressure wounds Musculoskeletal Complaints and Symptoms: Negative for: Muscle Pain; Muscle Weakness Neurologic Complaints and Symptoms: Negative for: Numbness/parasthesias; Focal/Weakness Medical History: Positive for: Neuropathy Negative for: Dementia; Quadriplegia; Paraplegia; Seizure Disorder Psychiatric Complaints and Symptoms: Negative for: Anxiety;  Claustrophobia Oncologic Medical History: Positive for: Received Chemotherapy Negative for: Received Radiation Past Medical History Notes: 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 Immunizations Pneumococcal Vaccine: Received Pneumococcal Vaccination: Yes Immunization Notes: up to date Implantable Devices None Family and Social History [name], GOETTEL ([id number]) Cancer: Yes - Mother; Diabetes: No; Heart Disease: No; Hereditary Spherocytosis: No; Hypertension: No; Kidney Disease: No; Lung Disease: No; Seizures: No; Stroke: Yes - Father; Thyroid Problems: No; Tuberculosis: No; Former smoker - quit 5 years ago; Marital Status - Divorced; Alcohol Use: Never; Drug Use: No History; Caffeine Use: Daily; Financial Concerns: No; Food, Clothing or Shelter Needs: No; Support System Lacking:  No; Transportation Concerns: No Electronic Signature(s) Signed: 11/12/2018 3:32:36 PM By: Montey Hora Signed: 11/14/2018 1:30:00 AM By: Worthy Keeler PA-C Entered By: Montey Hora on 11/12/2018 10:09:38 [name] ([id number]) -------------------------------------------------------------------------------- SuperBill Details Patient Name: [name] Date of Service: 11/12/2018 Medical Record Number: [id number] Patient Account Number: [id number] Date of Birth/Sex: 04-27-1951 (68 y.o. M) Treating RN: Harold Barban Primary Care Provider: SYSTEM, PCP Other Clinician: Referring Provider: Referral, Self Treating Provider/Extender: STONE III, [name] Weeks in Treatment: 0 Diagnosis Coding ICD-10 Codes Code Description E11.622 Type 2 diabetes mellitus with other skin ulcer I89.0 Lymphedema, not elsewhere classified I87.2 Venous insufficiency (chronic) (peripheral) L97.822 Non-pressure chronic ulcer of other part of left lower leg with fat layer exposed I10 Essential (primary) hypertension Facility Procedures CPT4 Code: [id number] Description: (Facility Use Only) [phone] - APPLY [facility]  LEG Modifier: Quantity: 1 Physician Procedures CPT4 Code Description: [id number] - WC PHYS LEVEL 4 - EST PT ICD-10 Diagnosis Description E11.622 Type 2 diabetes mellitus with other skin ulcer I89.0 Lymphedema, not elsewhere classified I87.2 Venous insufficiency (chronic) (peripheral) L97.822  Non-pressure chronic ulcer of other part of left lower leg wit Modifier: h fat layer expos Quantity: 1 ed Electronic Signature(s) Signed: 11/14/2018 1:30:00 AM By: Worthy Keeler PA-C Entered By: Worthy Keeler on 11/12/2018 15:20:12

## 2018-11-14 NOTE — Progress Notes (Signed)
TOBI, LEINWEBER ([id number]) Visit Report for 11/12/2018 Allergy List Details Patient Name: [name], [name] Date of Service: 11/12/2018 9:45 AM Medical Record Number: [id number] Patient Account Number: [id number] Date of Birth/Sex: Jan 17, 1951 (68 y.o. M) Treating RN: Montey Hora Primary Care Manasvini Whatley: SYSTEM, PCP Other Clinician: Referring Melynda Krzywicki: Referral, Self Treating Labrian Torregrossa/Extender: STONE III, [name] Weeks in Treatment: 0 Allergies Active Allergies No Known Drug Allergies Allergy Notes Electronic Signature(s) Signed: 11/12/2018 3:32:36 PM By: Montey Hora Entered By: Montey Hora on 11/12/2018 10:07:17 [name] ([id number]) -------------------------------------------------------------------------------- Arrival Information Details Patient Name: [name] Date of Service: 11/12/2018 9:45 AM Medical Record Number: [id number] Patient Account Number: [id number] Date of Birth/Sex: 06/11/1950 (68 y.o. M) Treating RN: Montey Hora Primary Care Ira Busbin: SYSTEM, PCP Other Clinician: Referring Dwight Burdo: Referral, Self Treating Zaara Sprowl/Extender: STONE III, [name] Weeks in Treatment: 0 Visit Information Patient Arrived: Ambulatory Arrival Time: 10:01 Accompanied By: self Transfer Assistance: None Patient Identification Verified: Yes Secondary Verification Process Completed: Yes Patient Has Alerts: Yes Patient Alerts: DMII History Since Last Visit Added or deleted any medications: No Any new allergies or adverse reactions: No Had a fall or experienced change in activities of daily living that may affect risk of falls: No Signs or symptoms of abuse/neglect since last visito No Hospitalized since last visit: No Implantable device outside of the clinic excluding cellular tissue based products placed in the center since last visit: No Has Dressing in Place as Prescribed: Yes Has Compression in Place as Prescribed: No Electronic Signature(s) Signed: 11/12/2018 3:32:36 PM By:  Montey Hora Entered By: Montey Hora on 11/12/2018 10:02:00 [name] ([id number]) -------------------------------------------------------------------------------- Clinic Level of Care Assessment Details Patient Name: [name] Date of Service: 11/12/2018 9:45 AM Medical Record Number: [id number] Patient Account Number: [id number] Date of Birth/Sex: 03-11-1951 (68 y.o. M) Treating RN: Harold Barban Primary Care Lynia Landry: SYSTEM, PCP Other Clinician: Referring Mayur Duman: Referral, Self Treating Amberlyn Martinezgarcia/Extender: STONE III, [name] Weeks in Treatment: 0 Clinic Level of Care Assessment Items TOOL 3 Quantity Score []  - Use when EandM and Procedure is performed on FOLLOW-UP visit 0 ASSESSMENTS - Nursing Assessment / Reassessment X - Reassessment of Co-morbidities (includes updates in patient status) 1 10 X- 1 5 Reassessment of Adherence to Treatment Plan ASSESSMENTS - Wound and Skin Assessment / Reassessment []  - Points for Wound Assessment can only be taken for a new wound of unknown or different 0 etiology and a procedure is NOT performed to that wound []  - 0 Simple Wound Assessment / Reassessment - one wound X- 4 5 Complex Wound Assessment / Reassessment - multiple wounds []  - 0 Dermatologic / Skin Assessment (not related to wound area) ASSESSMENTS - Focused Assessment X - Circumferential Edema Measurements - multi extremities 1 5 []  - 0 Nutritional Assessment / Counseling / Intervention []  - 0 Lower Extremity Assessment (monofilament, tuning fork, pulses) []  - 0 Peripheral Arterial Disease Assessment (using hand held doppler) ASSESSMENTS - Ostomy and/or Continence Assessment and Care []  - Incontinence Assessment and Management 0 []  - 0 Ostomy Care Assessment and Management (repouching, etc.) PROCESS - Coordination of Care []  - Points for Discharge Coordination can only be taken for a new wound of unknown or different 0 etiology and a procedure is NOT performed to  that wound X- 1 15 Simple Patient / Family Education for ongoing care []  - 0 Complex (extensive) Patient / Family Education for ongoing care []  - 0 Staff obtains Programmer, systems, Records, Test Results / Process Orders []  - 0 Staff telephones HHA, Nursing Homes / Clarify orders /  etc []  - 0 Routine Transfer to another Facility (non-emergent condition) []  - 0 Routine Hospital Admission (non-emergent condition) REYNALD, WOODS ([id number]) []  - 0 New Admissions / Biomedical engineer / Ordering NPWT, Apligraf, etc. []  - 0 Emergency Hospital Admission (emergent condition) X- 1 10 Simple Discharge Coordination []  - 0 Complex (extensive) Discharge Coordination PROCESS - Special Needs []  - Pediatric / Minor Patient Management 0 []  - 0 Isolation Patient Management []  - 0 Hearing / Language / Visual special needs []  - 0 Assessment of Community assistance (transportation, D/C planning, etc.) []  - 0 Additional assistance / Altered mentation []  - 0 Support Surface(s) Assessment (bed, cushion, seat, etc.) INTERVENTIONS - Wound Cleansing / Measurement []  - Points for Wound Cleaning / Measurement, Wound Dressing, Specimen Collection and 0 Specimen taken to lab can only be taken for a new wound of unknown or different etiology and a procedure is NOT performed to that wound []  - 0 Simple Wound Cleansing - one wound X- 4 5 Complex Wound Cleansing - multiple wounds X- 1 5 Wound Imaging (photographs - any number of wounds) []  - 0 Wound Tracing (instead of photographs) []  - 0 Simple Wound Measurement - one wound X- 4 5 Complex Wound Measurement - multiple wounds INTERVENTIONS - Wound Dressings []  - Small Wound Dressing one or multiple wounds 0 X- 4 15 Medium Wound Dressing one or multiple wounds []  - 0 Large Wound Dressing one or multiple wounds INTERVENTIONS - Miscellaneous []  - External ear exam 0 []  - 0 Specimen Collection (cultures, biopsies, blood, body fluids, etc.) []  -  0 Specimen(s) / Culture(s) sent or taken to Lab for analysis []  - 0 Patient Transfer (multiple staff / Civil Service fast streamer / Similar devices) []  - 0 Simple Staple / Suture removal (25 or less) []  - 0 Complex Staple / Suture removal (26 or more) Sand Pillow, Jassen ([id number]) []  - 0 Hypo / Hyperglycemic Management (close monitor of Blood Glucose) []  - 0 Ankle / Brachial Index (ABI) - do not check if billed separately X- 1 5 Vital Signs Has the patient been seen at the hospital within the last three years: Yes Total Score: 175 Level Of Care: New/Established - Level 5 Electronic Signature(s) Signed: 11/12/2018 4:36:57 PM By: Harold Barban Entered By: Harold Barban on 11/12/2018 10:56:50 [name] ([id number]) -------------------------------------------------------------------------------- Encounter Discharge Information Details Patient Name: [name] Date of Service: 11/12/2018 9:45 AM Medical Record Number: [id number] Patient Account Number: [id number] Date of Birth/Sex: 1950/06/17 (68 y.o. M) Treating RN: Army Melia Primary Care Retaj Hilbun: SYSTEM, PCP Other Clinician: Referring Adanely Reynoso: Referral, Self Treating Pinkey Mcjunkin/Extender: STONE III, [name] Weeks in Treatment: 0 Encounter Discharge Information Items Discharge Condition: Stable Ambulatory Status: Ambulatory Discharge Destination: Home Transportation: Private Auto Accompanied By: self Schedule Follow-up Appointment: Yes Clinical Summary of Care: Electronic Signature(s) Signed: 11/12/2018 4:22:26 PM By: Army Melia Entered By: Army Melia on 11/12/2018 11:11:20 [name] ([id number]) -------------------------------------------------------------------------------- Lower Extremity Assessment Details Patient Name: [name] Date of Service: 11/12/2018 9:45 AM Medical Record Number: [id number] Patient Account Number: [id number] Date of Birth/Sex: [birth year] (68 y.o. M) Treating RN: Montey Hora Primary Care Rochell Puett:  SYSTEM, PCP Other Clinician: Referring Alonia Dibuono: Referral, Self Treating Kasaundra Fahrney/Extender: STONE III, [name] Weeks in Treatment: 0 Edema Assessment Assessed: [Left: No] [Right: No] Edema: [Left: Ye] [Right: s] Calf Left: Right: Point of Measurement: 30 cm From Medial Instep 42 cm cm Ankle Left: Right: Point of Measurement: 11 cm From Medial Instep 32 cm cm Vascular Assessment Pulses: Dorsalis Pedis Palpable: [Left:No] Doppler Audible: [Left:Yes]  Posterior Tibial Palpable: [Left:No] Doppler Audible: [Left:Yes] Blood Pressure: Brachial: [Left:122] Dorsalis Pedis: 174 Ankle: Posterior Tibial: 188 Ankle Brachial Index: [Left:1.54] Electronic Signature(s) Signed: 11/12/2018 3:32:36 PM By: Montey Hora Entered By: Montey Hora on 11/12/2018 10:29:11 [name] ([id number]) -------------------------------------------------------------------------------- Multi Wound Chart Details Patient Name: [name] Date of Service: 11/12/2018 9:45 AM Medical Record Number: [id number] Patient Account Number: [id number] Date of Birth/Sex: 06-05-1950 (68 y.o. 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 Lower Extremity Lower Extremity Secondary Etiology: Venous Leg Ulcer Venous Leg Ulcer Venous Leg Ulcer Comorbid History: Hypertension, Peripheral Hypertension, Peripheral Hypertension, Peripheral Venous Disease,  Type II Venous Disease, Type II Venous Disease, Type II Diabetes, Neuropathy, Diabetes, Neuropathy, Diabetes, Neuropathy, Received Chemotherapy Received Chemotherapy Received Chemotherapy Date Acquired: 10/29/2018 10/29/2018 10/29/2018 Weeks of Treatment: 0 0 0 Wound Status: Open Open Open Clustered Wound: No No Yes Clustered Quantity: N/A N/A 2 Measurements L x W x D 0.9x1x0.1 3.4x7.5x0.1 7.5x3x0.1 (cm) Area (cm) : 0.707 20.028 17.671 Volume (cm) : 0.071 2.003 1.767 % Reduction in Area: N/A N/A 0.00% % Reduction in Volume: N/A N/A 0.00% Classification: Grade 1 Grade 1 Grade 1 Exudate Amount: Large Large Large Exudate Type: Serous Serous Serous Exudate Color: amber amber amber Wound Margin: Flat and Intact Flat and Intact Flat and Intact Granulation Amount: Large (67-100%) Medium (34-66%) Medium (34-66%) Granulation Quality: Pink Pink Pink Necrotic Amount: Small (1-33%) Medium (34-66%) Medium (34-66%) JP, EAST[place]AM ([id number]) Exposed Structures: Fat Layer (Subcutaneous Fat Layer (Subcutaneous Fat Layer (Subcutaneous Tissue) Exposed: Yes Tissue) Exposed: Yes Tissue) Exposed: Yes Fascia: No Fascia: No Fascia: No Tendon: No Tendon: No Tendon: No Muscle: No Muscle: No Muscle: No Joint: No Joint: No Joint: No Bone: No Bone: No Bone: No Epithelialization: None None None Wound Number: 7 N/A N/A Photos: N/A N/A Wound Location: Left Lower Leg - Lateral, Distal N/A N/A Wounding Event: Blister N/A N/A Primary Etiology: Diabetic Wound/Ulcer of the N/A N/A Lower Extremity Secondary Etiology: Venous Leg Ulcer N/A N/A Comorbid History: Hypertension, Peripheral N/A N/A Venous Disease, Type II Diabetes, Neuropathy, Received Chemotherapy Date Acquired: 10/29/2018 N/A N/A Weeks of Treatment: 0 N/A N/A Wound Status: Open N/A N/A Clustered Wound: No N/A N/A Clustered Quantity: N/A N/A N/A Measurements L x W x D 5x5.5x0.1 N/A N/A (cm) Area (cm) : 21.598 N/A N/A Volume  (cm) : 2.16 N/A N/A % Reduction in Area: N/A N/A N/A % Reduction in Volume: N/A N/A N/A Classification: Grade 1 N/A N/A Exudate Amount: Large N/A N/A Exudate Type: Serous N/A N/A Exudate Color: amber N/A N/A Wound Margin: Flat and Intact N/A N/A Granulation Amount: Medium (34-66%) N/A N/A Granulation Quality: Pink N/A N/A Necrotic Amount: Medium (34-66%) N/A N/A Exposed Structures: Fat Layer (Subcutaneous N/A N/A Tissue) Exposed: Yes Fascia: No Tendon: No Muscle: No Joint: No Bone: No Epithelialization: None N/A N/A Treatment Notes MOSS, BERRY ([id number]) Electronic Signature(s) Signed: 11/12/2018 4:36:57 PM By: Harold Barban Entered By: Harold Barban on 11/12/2018 10:42:19 [name] ([id number]) -------------------------------------------------------------------------------- [place] Details Patient Name: [name] Date of Service: 11/12/2018 9:45  AM Medical Record Number: [id number] Patient Account Number: [id number] Date of Birth/Sex: 1951/04/15 (68 y.o. M) Treating RN: Harold Barban Primary Care Key Cen: SYSTEM, PCP Other Clinician: Referring Haward Pope: Referral, Self Treating Danyla Wattley/Extender: STONE III, [name] Weeks in Treatment: 0 Active Inactive Venous Leg Ulcer Nursing Diagnoses: Actual venous Insuffiency (use after diagnosis is confirmed) Knowledge deficit related to disease process and management Goals: Patient will maintain optimal edema control Date Initiated: 11/12/2018 Target Resolution Date: 12/13/2018 Goal Status: Active Patient/caregiver will verbalize understanding of disease process and disease management Date Initiated: 11/12/2018 Target Resolution Date: 12/13/2018 Goal Status: Active Interventions: Assess peripheral edema status every visit. Compression as ordered Provide education on venous insufficiency Notes: Wound/Skin Impairment Nursing Diagnoses: Impaired tissue integrity Knowledge deficit related to  ulceration/compromised skin integrity Goals: Ulcer/skin breakdown will have a volume reduction of 30% by week 4 Date Initiated: 11/12/2018 Target Resolution Date: 12/13/2018 Goal Status: Active Interventions: Assess patient/caregiver ability to obtain necessary supplies Assess patient/caregiver ability to perform ulcer/skin care regimen upon admission and as needed Assess ulceration(s) every visit Provide education on ulcer and skin care Notes: GERMAIN, KOOPMANN ([id number]) Electronic Signature(s) Signed: 11/12/2018 4:36:57 PM By: Harold Barban Entered By: Harold Barban on 11/12/2018 10:41:46 [name] ([id number]) -------------------------------------------------------------------------------- Pain Assessment Details Patient Name: [name] Date of Service: 11/12/2018 9:45 AM Medical Record Number: [id number] Patient Account Number: [id number] Date of Birth/Sex: Oct 18, 1950 (68 y.o. M) Treating RN: Montey Hora Primary Care Kyland No: SYSTEM, PCP Other Clinician: Referring Lear Carstens: Referral, Self Treating Jovani Flury/Extender: STONE III, [name] Weeks in Treatment: 0 Active Problems Location of Pain Severity and Description of Pain Patient Has Paino No Site Locations Pain Management and Medication Current Pain Management: Electronic Signature(s) Signed: 11/12/2018 3:32:36 PM By: Montey Hora Entered By: Montey Hora on 11/12/2018 10:02:12 [name] ([id number]) -------------------------------------------------------------------------------- Patient/Caregiver Education Details Patient Name: [name] Date of Service: 11/12/2018 9:45 AM Medical Record Number: [id number] Patient Account Number: [id number] Date of Birth/Gender: [birth year] (68 y.o. M) Treating RN: Harold Barban Primary Care Physician: SYSTEM, PCP Other Clinician: Referring Physician: Referral, Self Treating Physician/Extender: Melburn Hake, [name] Weeks in Treatment: 0 Education Assessment Education Provided  To: Patient Education Topics Provided Venous: Handouts: Controlling Swelling with Compression Stockings Methods: Demonstration, Explain/Verbal Responses: State content correctly Wound/Skin Impairment: Handouts: Caring for Your Ulcer Methods: Demonstration, Explain/Verbal Responses: State content correctly Electronic Signature(s) Signed: 11/12/2018 4:36:57 PM By: Harold Barban Entered By: Harold Barban on 11/12/2018 10:42:47 [name] ([id number]) -------------------------------------------------------------------------------- Wound Assessment Details Patient Name: [name] Date of Service: 11/12/2018 9:45 AM Medical Record Number: [id number] Patient Account Number: [id number] Date of Birth/Sex: 06-01-1950 (68 y.o. M) Treating RN: Montey Hora Primary Care Wymon Swaney: SYSTEM, PCP Other Clinician: Referring Magdelene Ruark: Referral, Self Treating Manreet Kiernan/Extender: STONE III, [name] Weeks in Treatment: 0 Wound Status Wound Number: 4 Primary Diabetic Wound/Ulcer of the Lower Extremity Etiology: Wound Location: Left Lower Leg - Medial Secondary Venous Leg Ulcer Wounding Event: Blister Etiology: Date Acquired: 10/29/2018 Wound Open Weeks Of Treatment: 0 Status: Clustered Wound: No Comorbid Hypertension, Peripheral Venous Disease, History: Type II Diabetes, Neuropathy, Received Chemotherapy Photos Wound Measurements Length: (cm) 0.9 % Reduction in Width: (cm) 1 % Reduction in Depth: (cm) 0.1 Epithelializat Area: (cm) 0.707 Tunneling: Volume: (cm) 0.071 Undermining: Area: Volume: ion: None No No Wound Description Classification: Grade 1 Foul Odor Afte Wound Margin: Flat and Intact Slough/Fibrino Exudate Amount: Large Exudate Type: Serous Exudate Color: amber r Cleansing: No Yes Wound Bed Granulation Amount: Large (67-100%) Exposed Structure Granulation Quality: Pink Fascia Exposed: No Necrotic Amount: Small (1-33%) Fat Layer (  Subcutaneous Tissue) Exposed:  Yes Necrotic Quality: Adherent Slough Tendon Exposed: No Muscle Exposed: No Joint Exposed: No Bone Exposed: No Luddy, [name] ([id number]) Treatment Notes Wound #4 (Left, Medial Lower Leg) Notes silver cell, xtrasorb, 3 layer Electronic Signature(s) Signed: 11/12/2018 3:32:36 PM By: Montey Hora Entered By: Montey Hora on 11/12/2018 10:17:48 [name] ([id number]) -------------------------------------------------------------------------------- Wound Assessment Details Patient Name: [name] Date of Service: 11/12/2018 9:45 AM Medical Record Number: [id number] Patient Account Number: [id number] Date of Birth/Sex: 05/14/1950 (68 y.o. M) Treating RN: Montey Hora Primary Care Psalm Arman: SYSTEM, PCP Other Clinician: Referring Amayah Staheli: Referral, Self Treating Sharisse Rantz/Extender: STONE III, [name] Weeks in Treatment: 0 Wound Status Wound Number: 5 Primary Diabetic Wound/Ulcer of the Lower Extremity Etiology: Wound Location: Left Lower Leg - Lateral, Proximal Secondary Venous Leg Ulcer Wounding Event: Blister Etiology: Date Acquired: 10/29/2018 Wound Open Weeks Of Treatment: 0 Status: Clustered Wound: No Comorbid Hypertension, Peripheral Venous Disease, History: Type II Diabetes, Neuropathy, Received Chemotherapy Photos Wound Measurements Length: (cm) 3.4 Width: (cm) 7.5 Depth: (cm) 0.1 Area: (cm) 20.028 Volume: (cm) 2.003 % Reduction in Area: % Reduction in Volume: Epithelialization: None Tunneling: No Undermining: No Wound Description Classification: Grade 1 Wound Margin: Flat and Intact Exudate Amount: Large Exudate Type: Serous Exudate Color: amber Foul Odor After Cleansing: No Slough/Fibrino Yes Wound Bed Granulation Amount: Medium (34-66%) Exposed Structure Granulation Quality: Pink Fascia Exposed: No Necrotic Amount: Medium (34-66%) Fat Layer (Subcutaneous Tissue) Exposed: Yes Necrotic Quality: Adherent Slough Tendon Exposed: No Muscle  Exposed: No Joint Exposed: No Bone Exposed: No Mercado, [name] ([id number]) Treatment Notes Wound #5 (Left, Proximal, Lateral Lower Leg) Notes silver cell, xtrasorb, 3 layer Electronic Signature(s) Signed: 11/12/2018 3:32:36 PM By: Montey Hora Entered By: Montey Hora on 11/12/2018 10:19:50 [name] ([id number]) -------------------------------------------------------------------------------- Wound Assessment Details Patient Name: [name] Date of Service: 11/12/2018 9:45 AM Medical Record Number: [id number] Patient Account Number: [id number] Date of Birth/Sex: 1950-12-08 (68 y.o. M) Treating RN: Montey Hora Primary Care Omir Cooprider: SYSTEM, PCP Other Clinician: Referring Lenea Bywater: Referral, Self Treating Orrie Schubert/Extender: STONE III, [name] Weeks in Treatment: 0 Wound Status Wound Number: 6 Primary Diabetic Wound/Ulcer of the Lower Extremity Etiology: Wound Location: Left Lower Leg - Lateral Secondary Venous Leg Ulcer Wounding Event: Blister Etiology: Date Acquired: 10/29/2018 Wound Open Weeks Of Treatment: 0 Status: Clustered Wound: Yes Comorbid Hypertension, Peripheral Venous Disease, History: Type II Diabetes, Neuropathy, Received Chemotherapy Photos Wound Measurements Length: (cm) 7.5 % Reduction Width: (cm) 3 % Reduction Depth: (cm) 0.1 Epitheliali Clustered Quantity: 2 Tunneling: Area: (cm) 17.671 Underminin Volume: (cm) 1.767 in Area: 0% in Volume: 0% zation: None No g: No Wound Description Classification: Grade 1 Foul Odor A Wound Margin: Flat and Intact Slough/Fibr Exudate Amount: Large Exudate Type: Serous Exudate Color: amber fter Cleansing: No ino Yes Wound Bed Granulation Amount: Medium (34-66%) Exposed Structure Granulation Quality: Pink Fascia Exposed: No Necrotic Amount: Medium (34-66%) Fat Layer (Subcutaneous Tissue) Exposed: Yes Necrotic Quality: Adherent Slough Tendon Exposed: No Muscle Exposed: No Joint Exposed: No Bone  Exposed: No Fertig, Taber ([id number]) Treatment Notes Wound #6 (Left, Lateral Lower Leg) Notes silver cell, xtrasorb, 3 layer Electronic Signature(s) Signed: 11/12/2018 3:32:36 PM By: Montey Hora Entered By: Montey Hora on 11/12/2018 10:21:53 [name] ([id number]) -------------------------------------------------------------------------------- Wound Assessment Details Patient Name: [name] Date of Service: 11/12/2018 9:45 AM Medical Record Number: [id number] Patient Account Number: [id number] Date of Birth/Sex: [birth year] (68 y.o. M) Treating RN: Montey Hora Primary Care Deanglo Hissong: SYSTEM, PCP Other Clinician: Referring Bless Belshe: Referral, Self Treating Deshonda Cryderman/Extender: STONE III, [name] Weeks  in Treatment: 0 Wound Status Wound Number: 7 Primary Diabetic Wound/Ulcer of the Lower Extremity Etiology: Wound Location: Left Lower Leg - Lateral, Distal Secondary Venous Leg Ulcer Wounding Event: Blister Etiology: Date Acquired: 10/29/2018 Wound Open Weeks Of Treatment: 0 Status: Clustered Wound: No Comorbid Hypertension, Peripheral Venous Disease, History: Type II Diabetes, Neuropathy, Received Chemotherapy Photos Wound Measurements Length: (cm) 5 Width: (cm) 5.5 Depth: (cm) 0.1 Area: (cm) 21.598 Volume: (cm) 2.16 % Reduction in Area: % Reduction in Volume: Epithelialization: None Tunneling: No Undermining: No Wound Description Classification: Grade 1 Foul Odor Wound Margin: Flat and Intact Slough/Fi Exudate Amount: Large Exudate Type: Serous Exudate Color: amber After Cleansing: No brino Yes Wound Bed Granulation Amount: Medium (34-66%) Exposed Structure Granulation Quality: Pink Fascia Exposed: No Necrotic Amount: Medium (34-66%) Fat Layer (Subcutaneous Tissue) Exposed: Yes Necrotic Quality: Adherent Slough Tendon Exposed: No Muscle Exposed: No Joint Exposed: No Bone Exposed: No Furniss, [name] ([id number]) Treatment Notes Wound #7 (Left,  Distal, Lateral Lower Leg) Notes silver cell, xtrasorb, 3 layer Electronic Signature(s) Signed: 11/12/2018 3:32:36 PM By: Montey Hora Entered By: Montey Hora on 11/12/2018 10:23:28 [name] ([id number]) -------------------------------------------------------------------------------- Vitals Details Patient Name: [name] Date of Service: 11/12/2018 9:45 AM Medical Record Number: [id number] Patient Account Number: [id number] Date of Birth/Sex: 23-May-1950 (68 y.o. M) Treating RN: Montey Hora Primary Care Daisy Lites: SYSTEM, PCP Other Clinician: Referring Antionette Luster: Referral, Self Treating Laveah Gloster/Extender: STONE III, [name] Weeks in Treatment: 0 Vital Signs Time Taken: 10:02 Temperature (F): 98.3 Height (in): 73 Pulse (bpm): 56 Source: Measured Respiratory Rate (breaths/min): 18 Weight (lbs): 300 Blood Pressure (mmHg): 117/57 Source: Measured Reference Range: 80 - 120 mg / dl Body Mass Index (BMI): 39.6 Electronic Signature(s) Signed: 11/12/2018 3:32:36 PM By: Montey Hora Entered By: Montey Hora on 11/12/2018 10:06:40

## 2018-11-15 ENCOUNTER — Other Ambulatory Visit: Payer: Self-pay

## 2018-11-15 DIAGNOSIS — I872 Venous insufficiency (chronic) (peripheral): Secondary | ICD-10-CM | POA: Diagnosis not present

## 2018-11-15 NOTE — Progress Notes (Signed)
LANGLEY, INGALLS ([id number]) Visit Report for 11/15/2018 Arrival Information Details Patient Name: [name], [name] Date of Service: 11/15/2018 1:00 PM Medical Record Number: [id number] Patient Account Number: [id number] Date of Birth/Sex: 15-Sep-1950 (68 y.o. M) Treating RN: Montey Hora Primary Care Javoni Lucken: SYSTEM, PCP Other Clinician: Referring Fermin Yan: Referral, Self Treating Daksh Coates/Extender: STONE III, [name] Weeks in Treatment: 0 Visit Information History Since Last Visit Added or deleted any medications: No Patient Arrived: Ambulatory Any new allergies or adverse reactions: No Arrival Time: 13:05 Had a fall or experienced change in No Accompanied By: self activities of daily living that may affect Transfer Assistance: None risk of falls: Patient Identification Verified: Yes Signs or symptoms of abuse/neglect since last visito No Secondary Verification Process Completed: Yes Hospitalized since last visit: No Patient Has Alerts: Yes Implantable device outside of the clinic excluding No Patient Alerts: DMII cellular tissue based products placed in the center since last visit: Has Dressing in Place as Prescribed: Yes Has Compression in Place as Prescribed: Yes Pain Present Now: No Electronic Signature(s) Signed: 11/15/2018 4:02:59 PM By: Montey Hora Entered By: Montey Hora on 11/15/2018 13:05:37 [name] ([id number]) -------------------------------------------------------------------------------- Compression Therapy Details Patient Name: [name] Date of Service: 11/15/2018 1:00 PM Medical Record Number: [id number] Patient Account Number: [id number] Date of Birth/Sex: 1950-10-29 (68 y.o. M) Treating RN: Montey Hora Primary Care Eevee Borbon: SYSTEM, PCP Other Clinician: Referring Jalene Demo: Referral, Self Treating Mialee Weyman/Extender: STONE III, [name] Weeks in Treatment: 0 Compression Therapy Performed for Wound Assessment: Wound #4 Left,Medial Lower Leg Performed By:  Clinician Montey Hora, RN Compression Type: Three Layer Pre Treatment ABI: 1.5 Electronic Signature(s) Signed: 11/15/2018 4:02:59 PM By: Montey Hora Entered By: Montey Hora on 11/15/2018 13:07:13 [name] ([id number]) -------------------------------------------------------------------------------- Compression Therapy Details Patient Name: [name] Date of Service: 11/15/2018 1:00 PM Medical Record Number: [id number] Patient Account Number: [id number] Date of Birth/Sex: 27-Aug-1950 (68 y.o. M) Treating RN: Montey Hora Primary Care Aiken Withem: SYSTEM, PCP Other Clinician: Referring Serenitie Vinton: Referral, Self Treating Dasean Brow/Extender: STONE III, [name] Weeks in Treatment: 0 Compression Therapy Performed for Wound Assessment: Wound #5 Left,Proximal,Lateral Lower Leg Performed By: Clinician Montey Hora, RN Compression Type: Three Layer Pre Treatment ABI: 1.5 Electronic Signature(s) Signed: 11/15/2018 4:02:59 PM By: Montey Hora Entered By: Montey Hora on 11/15/2018 13:07:14 [name] ([id number]) -------------------------------------------------------------------------------- Compression Therapy Details Patient Name: [name] Date of Service: 11/15/2018 1:00 PM Medical Record Number: [id number] Patient Account Number: [id number] Date of Birth/Sex: 07-Apr-1951 (68 y.o. M) Treating RN: Montey Hora Primary Care Rukiya Hodgkins: SYSTEM, PCP Other Clinician: Referring Karrine Kluttz: Referral, Self Treating Mathayus Stanbery/Extender: STONE III, [name] Weeks in Treatment: 0 Compression Therapy Performed for Wound Assessment: Wound #6 Left,Lateral Lower Leg Performed By: Clinician Montey Hora, RN Compression Type: Three Layer Pre Treatment ABI: 1.5 Electronic Signature(s) Signed: 11/15/2018 4:02:59 PM By: Montey Hora Entered By: Montey Hora on 11/15/2018 13:07:14 [name]  ([id number]) -------------------------------------------------------------------------------- Compression Therapy Details Patient Name: [name] Date of Service: 11/15/2018 1:00 PM Medical Record Number: [id number] Patient Account Number: [id number] Date of Birth/Sex: 02/19/1951 (68 y.o. M) Treating RN: Montey Hora Primary Care Katherina Wimer: SYSTEM, PCP Other Clinician: Referring Chinara Hertzberg: Referral, Self Treating Shayley Medlin/Extender: STONE III, [name] Weeks in Treatment: 0 Compression Therapy Performed for Wound Assessment: Wound #7 Left,Distal,Lateral Lower Leg Performed By: Clinician Montey Hora, RN Compression Type: Three Layer Pre Treatment ABI: 1.5 Electronic Signature(s) Signed: 11/15/2018 4:02:59 PM By: Montey Hora Entered By: Montey Hora on 11/15/2018 13:07:14 [name] ([id number]) -------------------------------------------------------------------------------- Encounter Discharge Information Details Patient Name: [name] Date of Service: 11/15/2018 1:00 PM Medical  Record Number: [id number] Patient Account Number: [id number] Date of Birth/Sex: [birth year] (68 y.o. M) Treating RN: Montey Hora Primary Care Jasmen Emrich: SYSTEM, PCP Other Clinician: Referring Leighton Luster: Referral, Self Treating Jasilyn Holderman/Extender: STONE III, [name] Weeks in Treatment: 0 Encounter Discharge Information Items Discharge Condition: Stable Ambulatory Status: Ambulatory Discharge Destination: Home Transportation: Private Auto Accompanied By: self Schedule Follow-up Appointment: No Clinical Summary of Care: Electronic Signature(s) Signed: 11/15/2018 4:02:59 PM By: Montey Hora Entered By: Montey Hora on 11/15/2018 13:07:54 [name] ([id number]) -------------------------------------------------------------------------------- Wound Assessment Details Patient Name: [name] Date of Service: 11/15/2018 1:00 PM Medical Record Number: [id number] Patient Account Number:  [id number] Date of Birth/Sex: December 17, 1950 (68 y.o. M) Treating RN: Montey Hora Primary Care Alexsia Klindt: SYSTEM, PCP Other Clinician: Referring Aalijah Lanphere: Referral, Self Treating Winferd Wease/Extender: STONE III, [name] Weeks in Treatment: 0 Wound Status Wound Number: 4 Primary Diabetic Wound/Ulcer of the Lower Extremity Etiology: Wound Location: Left Lower Leg - Medial Secondary Venous Leg Ulcer Wounding Event: Blister Etiology: Date Acquired: 10/29/2018 Wound Open Weeks Of Treatment: 0 Status: Clustered Wound: No Comorbid Hypertension, Peripheral Venous Disease, History: Type II Diabetes, Neuropathy, Received Chemotherapy Wound Measurements Length: (cm) 0.9 Width: (cm) 1 Depth: (cm) 0.1 Area: (cm) 0.707 Volume: (cm) 0.071 % Reduction in Area: 0% % Reduction in Volume: 0% Epithelialization: None Tunneling: No Undermining: No Wound Description Classification: Grade 1 Wound Margin: Flat and Intact Exudate Amount: Large Exudate Type: Serous Exudate Color: amber Foul Odor After Cleansing: No Slough/Fibrino Yes Wound Bed Granulation Amount: Large (67-100%) Exposed Structure Granulation Quality: Pink Fascia Exposed: No Necrotic Amount: Small (1-33%) Fat Layer (Subcutaneous Tissue) Exposed: Yes Necrotic Quality: Adherent Slough Tendon Exposed: No Muscle Exposed: No Joint Exposed: No Bone Exposed: No Treatment Notes Wound #4 (Left, Medial Lower Leg) Notes silvercel, xtrasorb, 3 layer Electronic Signature(s) Signed: 11/15/2018 4:02:59 PM By: Montey Hora Entered By: Montey Hora on 11/15/2018 13:06:20 [name] ([id number]) -------------------------------------------------------------------------------- Wound Assessment Details Patient Name: [name] Date of Service: 11/15/2018 1:00 PM Medical Record Number: [id number] Patient Account Number: [id number] Date of Birth/Sex: [birth year] (68 y.o. M) Treating RN: Montey Hora Primary Care Audel Coakley: SYSTEM, PCP  Other Clinician: Referring Zauria Dombek: Referral, Self Treating Vibha Ferdig/Extender: STONE III, [name] Weeks in Treatment: 0 Wound Status Wound Number: 5 Primary Diabetic Wound/Ulcer of the Lower Extremity Etiology: Wound Location: Left Lower Leg - Lateral, Proximal Secondary Venous Leg Ulcer Wounding Event: Blister Etiology: Date Acquired: 10/29/2018 Wound Open Weeks Of Treatment: 0 Status: Clustered Wound: No Comorbid Hypertension, Peripheral Venous Disease, History: Type II Diabetes, Neuropathy, Received Chemotherapy Wound Measurements Length: (cm) 3.4 Width: (cm) 7.5 Depth: (cm) 0.1 Area: (cm) 20.028 Volume: (cm) 2.003 % Reduction in Area: 0% % Reduction in Volume: 0% Epithelialization: None Tunneling: No Undermining: No Wound Description Classification: Grade 1 Wound Margin: Flat and Intact Exudate Amount: Large Exudate Type: Serous Exudate Color: amber Foul Odor After Cleansing: No Slough/Fibrino Yes Wound Bed Granulation Amount: Medium (34-66%) Exposed Structure Granulation Quality: Pink Fascia Exposed: No Necrotic Amount: Medium (34-66%) Fat Layer (Subcutaneous Tissue) Exposed: Yes Necrotic Quality: Adherent Slough Tendon Exposed: No Muscle Exposed: No Joint Exposed: No Bone Exposed: No Treatment Notes Wound #5 (Left, Proximal, Lateral Lower Leg) Notes silvercel, xtrasorb, 3 layer Electronic Signature(s) Signed: 11/15/2018 4:02:59 PM By: Montey Hora Entered By: Montey Hora on 11/15/2018 13:06:32 [name] ([id number]) -------------------------------------------------------------------------------- Wound Assessment Details Patient Name: [name] Date of Service: 11/15/2018 1:00 PM Medical Record Number: [id number] Patient Account Number: [id number] Date of Birth/Sex: Apr 15, 1951 (68 y.o. M) Treating RN: Montey Hora Primary Care Fred Hammes: SYSTEM, PCP  Other Clinician: Referring Wyolene Weimann: Referral, Self Treating Kenetra Hildenbrand/Extender: STONE III,  [name] Weeks in Treatment: 0 Wound Status Wound Number: 6 Primary Diabetic Wound/Ulcer of the Lower Extremity Etiology: Wound Location: Left Lower Leg - Lateral Secondary Venous Leg Ulcer Wounding Event: Blister Etiology: Date Acquired: 10/29/2018 Wound Open Weeks Of Treatment: 0 Status: Clustered Wound: Yes Comorbid Hypertension, Peripheral Venous Disease, History: Type II Diabetes, Neuropathy, Received Chemotherapy Wound Measurements Length: (cm) 7.5 Width: (cm) 3 Depth: (cm) 0.1 Clustered Quantity: 2 Area: (cm) 17.671 Volume: (cm) 1.767 % Reduction in Area: 0% % Reduction in Volume: 0% Epithelialization: None Tunneling: No Undermining: No Wound Description Classification: Grade 1 Wound Margin: Flat and Intact Exudate Amount: Large Exudate Type: Serous Exudate Color: amber Foul Odor After Cleansing: No Slough/Fibrino Yes Wound Bed Granulation Amount: Medium (34-66%) Exposed Structure Granulation Quality: Pink Fascia Exposed: No Necrotic Amount: Medium (34-66%) Fat Layer (Subcutaneous Tissue) Exposed: Yes Necrotic Quality: Adherent Slough Tendon Exposed: No Muscle Exposed: No Joint Exposed: No Bone Exposed: No Treatment Notes Wound #6 (Left, Lateral Lower Leg) Notes silvercel, xtrasorb, 3 layer Electronic Signature(s) Signed: 11/15/2018 4:02:59 PM By: Montey Hora Entered By: Montey Hora on 11/15/2018 13:06:42 [name] ([id number] Moores, [name] ([id number]) -------------------------------------------------------------------------------- Wound Assessment Details Patient Name: [name] Date of Service: 11/15/2018 1:00 PM Medical Record Number: [id number] Patient Account Number: [id number] Date of Birth/Sex: [birth year] (69 y.o. M) Treating RN: Montey Hora Primary Care Taje Littler: SYSTEM, PCP Other Clinician: Referring Blimy Napoleon: Referral, Self Treating Niani Mourer/Extender: STONE III, [name] Weeks in Treatment: 0 Wound Status Wound Number: 7  Primary Diabetic Wound/Ulcer of the Lower Extremity Etiology: Wound Location: Left Lower Leg - Lateral, Distal Secondary Venous Leg Ulcer Wounding Event: Blister Etiology: Date Acquired: 10/29/2018 Wound Open Weeks Of Treatment: 0 Status: Clustered Wound: No Comorbid Hypertension, Peripheral Venous Disease, History: Type II Diabetes, Neuropathy, Received Chemotherapy Wound Measurements Length: (cm) 5 Width: (cm) 5.5 Depth: (cm) 0.1 Area: (cm) 21.598 Volume: (cm) 2.16 % Reduction in Area: 0% % Reduction in Volume: 0% Epithelialization: None Tunneling: No Undermining: No Wound Description Classification: Grade 1 Wound Margin: Flat and Intact Exudate Amount: Large Exudate Type: Serous Exudate Color: amber Foul Odor After Cleansing: No Slough/Fibrino Yes Wound Bed Granulation Amount: Medium (34-66%) Exposed Structure Granulation Quality: Pink Fascia Exposed: No Necrotic Amount: Medium (34-66%) Fat Layer (Subcutaneous Tissue) Exposed: Yes Necrotic Quality: Adherent Slough Tendon Exposed: No Muscle Exposed: No Joint Exposed: No Bone Exposed: No Treatment Notes Wound #7 (Left, Distal, Lateral Lower Leg) Notes silvercel, xtrasorb, 3 layer Electronic Signature(s) Signed: 11/15/2018 4:02:59 PM By: Montey Hora Entered By: Montey Hora on 11/15/2018 13:06:51

## 2018-11-19 ENCOUNTER — Encounter: Payer: PPO | Admitting: Internal Medicine

## 2018-11-19 ENCOUNTER — Other Ambulatory Visit: Payer: Self-pay

## 2018-11-19 DIAGNOSIS — S81802A Unspecified open wound, left lower leg, initial encounter: Secondary | ICD-10-CM | POA: Diagnosis not present

## 2018-11-19 DIAGNOSIS — I872 Venous insufficiency (chronic) (peripheral): Secondary | ICD-10-CM | POA: Diagnosis not present

## 2018-11-20 NOTE — Progress Notes (Signed)
ARLIND, KLINGERMAN ([id number]) Visit Report for 11/19/2018 Arrival Information Details Patient Name: CHEICK, SUHR Date of Service: 11/19/2018 9:00 AM Medical Record Number: [id number] Patient Account Number: [id number] Date of Birth/Sex: 1950-11-24 (68 y.o. M) Treating RN: Montey Hora Primary Care Latria Mccarron: SYSTEM, PCP Other Clinician: Referring Deysy Schabel: Referral, Self Treating Zyaira Vejar/Extender: Beverly Gust in Treatment: 1 Visit Information History Since Last Visit Added or deleted any medications: No Patient Arrived: Ambulatory Any new allergies or adverse reactions: No Arrival Time: 09:01 Had a fall or experienced change in No Accompanied By: self activities of daily living that may affect Transfer Assistance: None risk of falls: Patient Identification Verified: Yes Signs or symptoms of abuse/neglect since last visito No Secondary Verification Process Completed: Yes Hospitalized since last visit: No Patient Has Alerts: Yes Implantable device outside of the clinic excluding No Patient Alerts: DMII cellular tissue based products placed in the center since last visit: Has Dressing in Place as Prescribed: Yes Has Compression in Place as Prescribed: Yes Pain Present Now: No Electronic Signature(s) Signed: 11/19/2018 4:55:00 PM By: Montey Hora Entered By: Montey Hora on 11/19/2018 09:02:12 [name] ([id number]) -------------------------------------------------------------------------------- Clinic Level of Care Assessment Details Patient Name: [name] Date of Service: 11/19/2018 9:00 AM Medical Record Number: [id number] Patient Account Number: [id number] Date of Birth/Sex: [birth year] (68 y.o. M) Treating RN: Harold Barban Primary Care Jamilet Ambroise: SYSTEM, PCP Other Clinician: Referring Morton Simson: Referral, Self Treating Renaye Janicki/Extender: Beverly Gust in Treatment: 1 Clinic Level of Care Assessment Items TOOL 4 Quantity Score []  - Use when only  an EandM is performed on FOLLOW-UP visit 0 ASSESSMENTS - Nursing Assessment / Reassessment X - Reassessment of Co-morbidities (includes updates in patient status) 1 10 X- 1 5 Reassessment of Adherence to Treatment Plan ASSESSMENTS - Wound and Skin Assessment / Reassessment []  - Simple Wound Assessment / Reassessment - one wound 0 X- 4 5 Complex Wound Assessment / Reassessment - multiple wounds []  - 0 Dermatologic / Skin Assessment (not related to wound area) ASSESSMENTS - Focused Assessment X - Circumferential Edema Measurements - multi extremities 1 5 []  - 0 Nutritional Assessment / Counseling / Intervention []  - 0 Lower Extremity Assessment (monofilament, tuning fork, pulses) []  - 0 Peripheral Arterial Disease Assessment (using hand held doppler) ASSESSMENTS - Ostomy and/or Continence Assessment and Care []  - Incontinence Assessment and Management 0 []  - 0 Ostomy Care Assessment and Management (repouching, etc.) PROCESS - Coordination of Care X - Simple Patient / Family Education for ongoing care 1 15 []  - 0 Complex (extensive) Patient / Family Education for ongoing care []  - 0 Staff obtains Programmer, systems, Records, Test Results / Process Orders []  - 0 Staff telephones HHA, Nursing Homes / Clarify orders / etc []  - 0 Routine Transfer to another Facility (non-emergent condition) []  - 0 Routine Hospital Admission (non-emergent condition) []  - 0 New Admissions / Biomedical engineer / Ordering NPWT, Apligraf, etc. []  - 0 Emergency Hospital Admission (emergent condition) X- 1 10 Simple Discharge Coordination [place], GENNIE ([id number]) []  - 0 Complex (extensive) Discharge Coordination PROCESS - Special Needs []  - Pediatric / Minor Patient Management 0 []  - 0 Isolation Patient Management []  - 0 Hearing / Language / Visual special needs []  - 0 Assessment of Community assistance (transportation, D/C planning, etc.) []  - 0 Additional assistance / Altered mentation []  -  0 Support Surface(s) Assessment (bed, cushion, seat, etc.) INTERVENTIONS - Wound Cleansing / Measurement []  - Simple Wound Cleansing - one wound 0 X- 4 5 Complex Wound Cleansing - multiple  wounds X- 1 5 Wound Imaging (photographs - any number of wounds) []  - 0 Wound Tracing (instead of photographs) []  - 0 Simple Wound Measurement - one wound X- 4 5 Complex Wound Measurement - multiple wounds INTERVENTIONS - Wound Dressings []  - Small Wound Dressing one or multiple wounds 0 X- 4 15 Medium Wound Dressing one or multiple wounds []  - 0 Large Wound Dressing one or multiple wounds []  - 0 Application of Medications - topical []  - 0 Application of Medications - injection INTERVENTIONS - Miscellaneous []  - External ear exam 0 []  - 0 Specimen Collection (cultures, biopsies, blood, body fluids, etc.) []  - 0 Specimen(s) / Culture(s) sent or taken to Lab for analysis []  - 0 Patient Transfer (multiple staff / Civil Service fast streamer / Similar devices) []  - 0 Simple Staple / Suture removal (25 or less) []  - 0 Complex Staple / Suture removal (26 or more) []  - 0 Hypo / Hyperglycemic Management (close monitor of Blood Glucose) []  - 0 Ankle / Brachial Index (ABI) - do not check if billed separately X- 1 5 Vital Signs ASRIEL, WESTRUP ([id number]) Has the patient been seen at the hospital within the last three years: Yes Total Score: 175 Level Of Care: New/Established - Level 5 Electronic Signature(s) Signed: 11/19/2018 5:21:59 PM By: Harold Barban Entered By: Harold Barban on 11/19/2018 09:22:03 [name] ([id number]) -------------------------------------------------------------------------------- Encounter Discharge Information Details Patient Name: [name] Date of Service: 11/19/2018 9:00 AM Medical Record Number: [id number] Patient Account Number: [id number] Date of Birth/Sex: 1950-08-22 (68 y.o. M) Treating RN: Army Melia Primary Care Future Yeldell: SYSTEM, PCP Other  Clinician: Referring Joana Nolton: Referral, Self Treating Quetzally Callas/Extender: Beverly Gust in Treatment: 1 Encounter Discharge Information Items Discharge Condition: Stable Ambulatory Status: Ambulatory Discharge Destination: Home Transportation: Private Auto Accompanied By: self Schedule Follow-up Appointment: Yes Clinical Summary of Care: Electronic Signature(s) Signed: 11/19/2018 11:19:00 AM By: Army Melia Entered By: Army Melia on 11/19/2018 09:36:23 [name] ([id number]) -------------------------------------------------------------------------------- Lower Extremity Assessment Details Patient Name: [name] Date of Service: 11/19/2018 9:00 AM Medical Record Number: [id number] Patient Account Number: [id number] Date of Birth/Sex: Jan 01, 1951 (68 y.o. M) Treating RN: Montey Hora Primary Care Trayven Lumadue: SYSTEM, PCP Other Clinician: Referring Angelle Isais: Referral, Self Treating Latavia Goga/Extender: Beverly Gust in Treatment: 1 Edema Assessment Assessed: [Left: No] [Right: No] Edema: [Left: Ye] [Right: s] Calf Left: Right: Point of Measurement: 30 cm From Medial Instep 39.5 cm cm Ankle Left: Right: Point of Measurement: 11 cm From Medial Instep 31.5 cm cm Vascular Assessment Pulses: Dorsalis Pedis Palpable: [Left:Yes] Electronic Signature(s) Signed: 11/19/2018 4:55:00 PM By: Montey Hora Entered By: Montey Hora on 11/19/2018 09:08:49 [name] ([id number]) -------------------------------------------------------------------------------- Multi Wound Chart Details Patient Name: [name] Date of Service: 11/19/2018 9:00 AM Medical Record Number: [id number] Patient Account Number: [id number] Date of Birth/Sex: [birth year] (68 y.o. 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1x0.1x0.1 2.7x2.1x0.1 (cm) Area (cm) : 0.44 0.008 4.453 Volume (cm) : 0.044 0.001 0.445 % Reduction in Area: 37.80% 100.00% 74.80% % Reduction in Volume: 38.00% 100.00% 74.80% Classification: Grade 1 Grade 1 Grade 1 Exudate Amount: Large Large Large Exudate Type: Serous Serous Serous Exudate Color: amber amber amber Wound Margin: Flat and Intact Flat and Intact Flat and Intact Granulation Amount: Large (67-100%) Medium (34-66%) Large (67-100%) Granulation Quality: Pink Pink Pink Necrotic Amount: Small (1-33%) Medium (34-66%) Small (1-33%) Roane, Cote ([id number]) Exposed Structures: Fat Layer (Subcutaneous Fat Layer (Subcutaneous Fat Layer (Subcutaneous Tissue) Exposed: Yes Tissue) Exposed: Yes Tissue) Exposed: Yes Fascia: No Fascia: No Fascia: No Tendon: No Tendon: No Tendon: No Muscle: No Muscle: No Muscle: No Joint: No Joint: No Joint: No Bone: No Bone: No Bone:  No Epithelialization: None None None Wound Number: 7 N/A N/A Photos: N/A N/A Wound Location: Left Lower Leg - Lateral, Distal N/A N/A Wounding Event: Blister N/A N/A Primary Etiology: Diabetic Wound/Ulcer of the N/A N/A Lower Extremity Secondary Etiology: Venous Leg Ulcer N/A N/A Comorbid History: Hypertension, Peripheral N/A N/A Venous Disease, Type II Diabetes, Neuropathy, Received Chemotherapy Date Acquired: 10/29/2018 N/A N/A Weeks of Treatment: 1 N/A N/A Wound Status: Open N/A N/A Clustered Wound: No N/A N/A Clustered Quantity: N/A N/A N/A Measurements L x W x D 4.2x3.1x0.1 N/A N/A (cm) Area (cm) : 10.226 N/A N/A Volume (cm) : 1.023 N/A N/A % Reduction in Area: 52.70% N/A N/A % Reduction in Volume: 52.60% N/A N/A Classification: Grade 1 N/A N/A Exudate Amount: Large N/A N/A Exudate Type: Serous N/A N/A Exudate Color: amber N/A N/A Wound Margin: Flat and Intact N/A N/A Granulation Amount: Small (1-33%) N/A N/A Granulation Quality: Pink N/A N/A Necrotic Amount: Large (67-100%) N/A N/A Exposed Structures: Fat Layer (Subcutaneous N/A N/A Tissue) Exposed: Yes Fascia: No Tendon: No Muscle: No Joint: No Bone: No Epithelialization: None N/A N/A Treatment Notes GERHARDT, GLEED ([id number]) Electronic Signature(s) Signed: 11/19/2018 5:21:59 PM By: Harold Barban Entered By: Harold Barban on 11/19/2018 09:20:26 [name] ([id number]) -------------------------------------------------------------------------------- Multi-Disciplinary Care Plan Details Patient Name: [name] Date of Service: 11/19/2018 9:00 AM Medical Record Number: [id number] Patient Account Number: [id number] Date of Birth/Sex: Dec 08, 1950 (68 y.o. M) Treating RN: Harold Barban Primary Care Vitali Seibert: SYSTEM, PCP Other Clinician: Referring Jmichael Gille: Referral, Self Treating Kaarin Pardy/Extender: Beverly Gust in Treatment: 1 Active Inactive Venous Leg Ulcer Nursing Diagnoses: Actual  venous Insuffiency (use after diagnosis is confirmed) Knowledge deficit related to disease process and management Goals: Patient will maintain optimal edema control Date Initiated: 11/12/2018 Target Resolution Date: 12/13/2018 Goal Status: Active Patient/caregiver will verbalize understanding of disease process and disease management Date Initiated: 11/12/2018 Target Resolution Date: 12/13/2018 Goal Status: Active Interventions: Assess peripheral edema status every visit. Compression as ordered Provide education on venous insufficiency Notes: Wound/Skin Impairment Nursing Diagnoses: Impaired tissue integrity Knowledge deficit related to ulceration/compromised skin integrity Goals: Ulcer/skin breakdown will have a volume reduction of 30% by week 4 Date Initiated: 11/12/2018 Target Resolution Date: 12/13/2018 Goal Status: Active Interventions: Assess patient/caregiver ability to obtain necessary supplies Assess patient/caregiver ability to perform ulcer/skin care regimen upon admission and as needed Assess ulceration(s) every visit Provide education on ulcer and skin care Notes: KRISTAIN, HU ([id number])  Electronic Signature(s) Signed: 11/19/2018 5:21:59 PM By: Harold Barban Entered By: Harold Barban on 11/19/2018 09:19:53 [name] ([id number]) -------------------------------------------------------------------------------- Pain Assessment Details Patient Name: [name] Date of Service: 11/19/2018 9:00 AM Medical Record Number: [id number] Patient Account Number: [id number] Date of Birth/Sex: 1951/03/13 (68 y.o. M) Treating RN: Montey Hora Primary Care Talyia Allende: SYSTEM, PCP Other Clinician: Referring Tippi Mccrae: Referral, Self Treating Tida Saner/Extender: Beverly Gust in Treatment: 1 Active Problems Location of Pain Severity and Description of Pain Patient Has Paino No Site Locations Pain Management and Medication Current Pain Management: Electronic  Signature(s) Signed: 11/19/2018 4:55:00 PM By: Montey Hora Entered By: Montey Hora on 11/19/2018 09:02:19 [name] ([id number]) -------------------------------------------------------------------------------- Patient/Caregiver Education Details Patient Name: [name] Date of Service: 11/19/2018 9:00 AM Medical Record Number: [id number] Patient Account Number: [id number] Date of Birth/Gender: July 27, 1950 (68 y.o. M) Treating RN: Harold Barban Primary Care Physician: SYSTEM, PCP Other Clinician: Referring Physician: Referral, Self Treating Physician/Extender: Beverly Gust in Treatment: 1 Education Assessment Education Provided To: Patient Education Topics Provided Venous: Handouts: Controlling Swelling with Compression Stockings Methods: Demonstration, Explain/Verbal Responses: State content correctly Wound/Skin Impairment: Handouts: Caring for Your Ulcer Methods: Demonstration, Explain/Verbal Responses: State content correctly Electronic Signature(s) Signed: 11/19/2018 5:21:59 PM By: Harold Barban Entered By: Harold Barban on 11/19/2018 09:20:54 [name] ([id number]) -------------------------------------------------------------------------------- Wound Assessment Details Patient Name: [name] Date of Service: 11/19/2018 9:00 AM Medical Record Number: [id number] Patient Account Number: [id number] Date of Birth/Sex: 03-23-1951 (68 y.o. M) Treating RN: Montey Hora Primary Care Dejean Tribby: SYSTEM, PCP Other Clinician: Referring Janelis Stelzer: Referral, Self Treating Desmon Hitchner/Extender: Beverly Gust in Treatment: 1 Wound Status Wound Number: 4 Primary Diabetic Wound/Ulcer of the Lower Extremity Etiology: Wound Location: Left Lower Leg - Medial Secondary Venous Leg Ulcer Wounding Event: Blister Etiology: Date Acquired: 10/29/2018 Wound Open Weeks Of Treatment: 1 Status: Clustered Wound: No Comorbid Hypertension, Peripheral Venous  Disease, History: Type II Diabetes, Neuropathy, Received Chemotherapy Photos Wound Measurements Length: (cm) 0.8 % Reduction in Width: (cm) 0.7 % Reduction in Depth: (cm) 0.1 Epithelializat Area: (cm) 0.44 Tunneling: Volume: (cm) 0.044 Undermining: Area: 37.8% Volume: 38% ion: None No No Wound Description Classification: Grade 1 Foul Odor Afte Wound Margin: Flat and Intact Slough/Fibrino Exudate Amount: Large Exudate Type: Serous Exudate Color: amber r Cleansing: No Yes Wound Bed Granulation Amount: Large (67-100%) Exposed Structure Granulation Quality: Pink Fascia Exposed: No Necrotic Amount: Small (1-33%) Fat Layer (Subcutaneous Tissue) Exposed: Yes Necrotic Quality: Adherent Slough Tendon Exposed: No Muscle Exposed: No Joint Exposed: No Bone Exposed: No Vanosdol, [name] ([id number]) Treatment Notes Wound #4 (Left, Medial Lower Leg) Notes silver ag, ABD, 3 layer with unna to anchor Electronic Signature(s) Signed: 11/19/2018 4:55:00 PM By: Montey Hora Entered By: Montey Hora on 11/19/2018 09:14:52 [name] ([id number]) -------------------------------------------------------------------------------- Wound Assessment Details Patient Name: [name] Date of Service: 11/19/2018 9:00 AM Medical Record Number: [id number] Patient Account Number: [id number] Date of Birth/Sex: 20-Nov-1950 (68 y.o. M) Treating RN: Montey Hora Primary Care Alvey Brockel: SYSTEM, PCP Other Clinician: Referring Kaniesha Barile: Referral, Self Treating Michiah Masse/Extender: Beverly Gust in Treatment: 1 Wound Status Wound Number: 5 Primary Diabetic Wound/Ulcer of the Lower Extremity Etiology: Wound Location: Left Lower Leg - Lateral, Proximal Secondary Venous Leg Ulcer Wounding Event: Blister Etiology: Date Acquired: 10/29/2018 Wound Open Weeks Of Treatment: 1 Status: Clustered Wound: No Comorbid Hypertension, Peripheral Venous Disease, History: Type II Diabetes,  Neuropathy, Received Chemotherapy Photos Wound Measurements Length: (cm) 0.1 Width: (cm) 0.1 Depth: (cm) 0.1 Area: (cm) 0.008 Volume: (cm) 0.001 % Reduction in Area: 100% %  Reduction in Volume: 100% Epithelialization: None Tunneling: No Undermining: No Wound Description Classification: Grade 1 Wound Margin: Flat and Intact Exudate Amount: Large Exudate Type: Serous Exudate Color: amber Foul Odor After Cleansing: No Slough/Fibrino Yes Wound Bed Granulation Amount: Medium (34-66%) Exposed Structure Granulation Quality: Pink Fascia Exposed: No Necrotic Amount: Medium (34-66%) Fat Layer (Subcutaneous Tissue) Exposed: Yes Necrotic Quality: Adherent Slough Tendon Exposed: No Muscle Exposed: No Joint Exposed: No Bone Exposed: No Kreh, [name] ([id number]) Treatment Notes Wound #5 (Left, Proximal, Lateral Lower Leg) Notes silver ag, ABD, 3 layer with unna to anchor Electronic Signature(s) Signed: 11/19/2018 4:55:00 PM By: Montey Hora Entered By: Montey Hora on 11/19/2018 09:15:23 [name] ([id number]) -------------------------------------------------------------------------------- Wound Assessment Details Patient Name: [name] Date of Service: 11/19/2018 9:00 AM Medical Record Number: [id number] Patient Account Number: [id number] Date of Birth/Sex: [birth year] (68 y.o. M) Treating RN: Montey Hora Primary Care Athziri Freundlich: SYSTEM, PCP Other Clinician: Referring Alaney Witter: Referral, Self Treating Nyhla Mountjoy/Extender: Beverly Gust in Treatment: 1 Wound Status Wound Number: 6 Primary Diabetic Wound/Ulcer of the Lower Extremity Etiology: Wound Location: Left Lower Leg - Lateral Secondary Venous Leg Ulcer Wounding Event: Blister Etiology: Date Acquired: 10/29/2018 Wound Open Weeks Of Treatment: 1 Status: Clustered Wound: Yes Comorbid Hypertension, Peripheral Venous Disease, History: Type II Diabetes, Neuropathy,  Received Chemotherapy Photos Wound Measurements Length: (cm) 2.7 % Reducti Width: (cm) 2.1 % Reducti Depth: (cm) 0.1 Epithelia Clustered Quantity: 2 Tunneling Area: (cm) 4.453 Undermin Volume: (cm) 0.445 on in Area: 74.8% on in Volume: 74.8% lization: None : No ing: No Wound Description Classification: Grade 1 Foul Odor Wound Margin: Flat and Intact Slough/Fi Exudate Amount: Large Exudate Type: Serous Exudate Color: amber After Cleansing: No brino Yes Wound Bed Granulation Amount: Large (67-100%) Exposed Structure Granulation Quality: Pink Fascia Exposed: No Necrotic Amount: Small (1-33%) Fat Layer (Subcutaneous Tissue) Exposed: Yes Necrotic Quality: Adherent Slough Tendon Exposed: No Muscle Exposed: No Joint Exposed: No Bone Exposed: No Zafar, [name] ([id number]) Treatment Notes Wound #6 (Left, Lateral Lower Leg) Notes silver ag, ABD, 3 layer with unna to anchor Electronic Signature(s) Signed: 11/19/2018 4:55:00 PM By: Montey Hora Entered By: Montey Hora on 11/19/2018 [place] [name] ([id number]) -------------------------------------------------------------------------------- Wound Assessment Details Patient Name: [name] Date of Service: 11/19/2018 9:00 AM Medical Record Number: [id number] Patient Account Number: [id number] Date of Birth/Sex: January 16, 1951 (68 y.o. M) Treating RN: Montey Hora Primary Care Leighton Brickley: SYSTEM, PCP Other Clinician: Referring Kerline Trahan: Referral, Self Treating Abran Gavigan/Extender: Beverly Gust in Treatment: 1 Wound Status Wound Number: 7 Primary Diabetic Wound/Ulcer of the Lower Extremity Etiology: Wound Location: Left Lower Leg - Lateral, Distal Secondary Venous Leg Ulcer Wounding Event: Blister Etiology: Date Acquired: 10/29/2018 Wound Open Weeks Of Treatment: 1 Status: Clustered Wound: No Comorbid Hypertension, Peripheral Venous Disease, History: Type II Diabetes, Neuropathy,  Received Chemotherapy Photos Wound Measurements Length: (cm) 4.2 Width: (cm) 3.1 Depth: (cm) 0.1 Area: (cm) 10.226 Volume: (cm) 1.023 % Reduction in Area: 52.7% % Reduction in Volume: 52.6% Epithelialization: None Tunneling: No Undermining: No Wound Description Classification: Grade 1 Foul Odor Wound Margin: Flat and Intact Slough/Fi Exudate Amount: Large Exudate Type: Serous Exudate Color: amber After Cleansing: No brino Yes Wound Bed Granulation Amount: Small (1-33%) Exposed Structure Granulation Quality: Pink Fascia Exposed: No Necrotic Amount: Large (67-100%) Fat Layer (Subcutaneous Tissue) Exposed: Yes Necrotic Quality: Adherent Slough Tendon Exposed: No Muscle Exposed: No Joint Exposed: No Bone Exposed: No Boschert, Kace ([id number]) Treatment Notes Wound #7 (Left, Distal, Lateral Lower Leg) Notes silver ag, ABD, 3 layer with unna  to anchor Electronic Signature(s) Signed: 11/19/2018 4:55:00 PM By: Montey Hora Entered By: Montey Hora on 11/19/2018 09:16:34 [name] ([id number]) -------------------------------------------------------------------------------- [place] Details Patient Name: [name] Date of Service: 11/19/2018 9:00 AM Medical Record Number: [id number] Patient Account Number: [id number] Date of Birth/Sex: Jun 16, 1950 (68 y.o. M) Treating RN: Montey Hora Primary Care Chesney Klimaszewski: SYSTEM, PCP Other Clinician: Referring [place]: Referral, Self Treating Anyely Cunning/Extender: Beverly Gust in Treatment: 1 Vital Signs Time Taken: 09:02 Temperature (F): 98.4 Height (in): 73 Pulse (bpm): 49 Weight (lbs): 300 Respiratory Rate (breaths/min): 18 Body Mass Index (BMI): 39.6 Blood Pressure (mmHg): 133/50 Reference Range: 80 - 120 mg / dl Electronic Signature(s) Signed: 11/19/2018 4:55:00 PM By: Montey Hora Entered By: Montey Hora on 11/19/2018 09:03:20

## 2018-11-20 NOTE — Progress Notes (Signed)
RYDELL, WIEGEL ([id number]) Visit Report for 11/19/2018 HPI Details Patient Name: [name], [name] Date of Service: 11/19/2018 9:00 AM Medical Record Number: [id number] Patient Account Number: [id number] Date of Birth/Sex: 06/20/1950 (68 y.o. M) Treating RN: Harold Barban Primary Care Provider: SYSTEM, PCP Other Clinician: Referring Provider: Referral, Self Treating Provider/Extender: Beverly Gust in Treatment: 1 History of Present Illness HPI Description: 68 year old male who presented to the ER with bilateral lower extremity blisters which had started last week. he has a past medical history of leukemia, diabetes mellitus, hypertension, edema of both lower extremities, his recurrent skin infections, peripheral vascular disease, coronary artery disease, congestive heart failure and peripheral neuropathy. in the ER he was given Rocephin and put on Silvadene cream. he was put on oral doxycycline and was asked to follow-up with the [facility]. His last hemoglobin A1c was 6.6 in December and he checks his blood sugar once a week. He does not have any physicians outside the [place] system. He does not recall any vascular duplex studies done either for arterial or venous disease but was told to wear compression stockings which he does not use 05/30/2016 -- we have not yet received any of his notes from the [facility] hospital system and his arterial and venous duplex studies are scheduled here in [place] around mid February. We are unable to have his insurance accepted by home health agencies and hence he is getting dressings only once a week. 06/06/16 -- -- I received a call from the patient's PCP at the [facility] and spoke to Dr. Garvin Fila, phone number [phone] and fax number [phone]. She confirmed that no vascular testing was done over the last 5 years and she would be happy to do them if the patient did want them to be done at the [place] and we could fax him a  request. Readmission: 67 year old male seen by as in February of this year and was referred to vein and vascular for studies and opinion from the vascular surgeons. The patient returns today with a fresh problem having had blisters on his left lower extremity which have been there for about 5 days and he clearly states that he has been wearing his compression stockings as advised though he could not read the moderate compression and has been wearing light compression. Review of his electronic medical records note that he had lower extremity arterial duplex examination done on 06/23/2016 which showed no hemodynamically significant stenosis in the bilateral lower extremity arterial system. He also had a lower extremity venous reflux examination done on 07/07/2016 and it was noted that he had venous incompetence in the right great saphenous vein and bilateral common femoral veins. Patient was seen by Dr. Tamala Julian on the same day and for some reason his notes do not reflect the venous studies or the arterial studies and he recommended patient do a venous duplex ultrasound to look for reflux and return to see him.he would also consider a lymph pump if required. The patient was told that his workup was normal and hence the patient canceled his follow-up appointment. 02/03/17 on evaluation today patient left medial lower extremity blister appears to be doing about the same. It is still continuing to drain and there's still the blistered skin covering the wound bed which is making it difficult for the alternate to do its job. Fortunately there is no evidence of cellulitis. No fevers chills noted. Patient states in general he is not having any significant discomfort. Patient's lower extremity arterial duplex exam revealed  that patient was hemodynamically stable with no evidence of stenosis in regard to the bilateral lower extremities. The lower extremity venous reflux exam revealed the patient had venous  incontinence noted in the right greater saphenous and bilateral common femoral vein. There is no evidence of deep or superficial vein thrombosis in the bilateral lower [place], [name] ([id number]) extremities. Readmission: 11/12/18 Patient presents for evaluation our clinic today concerning issues that he is having with his left lower extremity. He tells me that a couple weeks ago he began developing blisters on the left lower extremity along with increased swelling. He typically wears his compression stockings on a regular basis is previously been evaluated both here as well is with vascular surgery they would recommend lymphedema pumps but unfortunately that somehow fell through and he never heard anything back from that. Nonetheless I think lymphedema pumps would be beneficial for this patient. He does have a history of hypertension and diabetes. Obviously the chronic venous stasis and lymphedema as well. At this point the blisters have been given in more trouble he states sometimes when the blisters openings able to clean it down with alcohol and it will dry out and do well. Unfortunately that has not been the case this time. He is having some discomfort although this mean these with cleaning the areas he doesn't have discomfort just on a regular basis. He has not been able to wear his compression stockings since the blisters arose due to the fact that of course it will drain into the socks causing additional issues and he didn't have any way to wrap this otherwise. He has increased to taking his Lasix every day instead of every other day. He sees his primary care provider later this month as well. No fevers, chills, nausea, or vomiting noted at this time. 11/19/18-Patient returns at 1 week, per intake RN the amount of seepage into the compression wraps was definitely improved, overall all the wounds are measuring smaller but continuing silver alginate to the wounds as primary dressing Electronic  Signature(s) Signed: 11/19/2018 9:24:30 AM By: Tobi Bastos Entered By: Tobi Bastos on 11/19/2018 09:24:29 [name] ([id number]) -------------------------------------------------------------------------------- Physical Exam Details Patient Name: [name] Date of Service: 11/19/2018 9:00 AM Medical Record Number: [id number] Patient Account Number: [id number] Date of Birth/Sex: November 27, 1950 (68 y.o. M) Treating RN: Harold Barban Primary Care Provider: SYSTEM, PCP Other Clinician: Referring Provider: Referral, Self Treating Provider/Extender: Beverly Gust in Treatment: 1 Constitutional alert and oriented x 3. sitting or standing blood pressure is within target range for patient.. supine blood pressure is within target range for patient.. pulse regular and within target range for patient.Marland Kitchen respirations regular, non-labored and within target range for patient.Marland Kitchen temperature within target range for patient.. . . Well-nourished and well-hydrated in no acute distress. Notes The left lateral leg wounds numbering 4 are definitely looking better, the base appears healthy, the periwound does not have any significant inflammation, no slough or necrotic areas noted in the wounds, the smallest of the wounds are the most proximal they are almost closed. Electronic Signature(s) Signed: 11/19/2018 9:25:20 AM By: Tobi Bastos Entered By: Tobi Bastos on 11/19/2018 09:25:20 [name] ([id number]) -------------------------------------------------------------------------------- Physician Orders Details Patient Name: [name] Date of Service: 11/19/2018 9:00 AM Medical Record Number: [id number] Patient Account Number: [id number] Date of Birth/Sex: [birth year] (68 y.o. M) Treating RN: Harold Barban Primary Care Provider: SYSTEM, PCP Other Clinician: Referring Provider: Referral, Self Treating Provider/Extender: Beverly Gust in Treatment: 1 Verbal / Phone Orders:  No Diagnosis Coding  Wound Cleansing Wound #4 Left,Medial Lower Leg o May shower with protection. - Can cover with bag and tape tightly so not to leak or purchase a cast protector to keep wrap dry Wound #5 Left,Proximal,Lateral Lower Leg o May shower with protection. - Can cover with bag and tape tightly so not to leak or purchase a cast protector to keep wrap dry Wound #6 Left,Lateral Lower Leg o May shower with protection. - Can cover with bag and tape tightly so not to leak or purchase a cast protector to keep wrap dry Wound #7 Left,Distal,Lateral Lower Leg o May shower with protection. - Can cover with bag and tape tightly so not to leak or purchase a cast protector to keep wrap dry Primary Wound Dressing Wound #4 Left,Medial Lower Leg o Silver Alginate Wound #5 Left,Proximal,Lateral Lower Leg o Silver Alginate Wound #6 Left,Lateral Lower Leg o Silver Alginate Wound #7 Left,Distal,Lateral Lower Leg o Silver Alginate Dressing Change Frequency Wound #4 Left,Medial Lower Leg o Change dressing every week Wound #5 Left,Proximal,Lateral Lower Leg o Change dressing every week Wound #6 Left,Lateral Lower Leg o Change dressing every week Wound #7 Left,Distal,Lateral Lower Leg o Change dressing every week Follow-up Appointments [name], [name] ([id number]) Wound #4 Left,Medial Lower Leg o Return Appointment in 1 week. Wound #5 Left,Proximal,Lateral Lower Leg o Return Appointment in 1 week. Wound #6 Left,Lateral Lower Leg o Return Appointment in 1 week. Wound #7 Left,Distal,Lateral Lower Leg o Return Appointment in 1 week. Edema Control Wound #4 Left,Medial Lower Leg o 3 Layer Compression System - Left Lower Extremity o Unna Boot to Left Lower Extremity - Unna only at top to anchor o Compression Pump: Use compression pump on left lower extremity for 60 minutes, twice daily. - Wound Care will order pumps for Patient o Compression Pump: Use  compression pump on right lower extremity for 60 minutes, twice daily. - Wound Care will order pumps for Patient Wound #5 Left,Proximal,Lateral Lower Leg o 3 Layer Compression System - Left Lower Extremity o Unna Boot to Left Lower Extremity - Unna only at top to anchor Wound #6 Left,Lateral Lower Leg o 3 Layer Compression System - Left Lower Extremity o Unna Boot to Left Lower Extremity - Unna only at top to anchor Wound #7 Left,Distal,Lateral Lower Leg o 3 Layer Compression System - Left Lower Extremity o Unna Boot to Left Lower Extremity - Unna only at top to anchor Off-Loading Wound #4 Left,Medial Lower Leg o Other: - Elevate leg while sitting, to aid in the reduction of swelling. Wound #5 Left,Proximal,Lateral Lower Leg o Other: - Elevate leg while sitting, to aid in the reduction of swelling. Wound #6 Left,Lateral Lower Leg o Other: - Elevate leg while sitting, to aid in the reduction of swelling. Wound #7 Left,Distal,Lateral Lower Leg o Other: - Elevate leg while sitting, to aid in the reduction of swelling. Electronic Signature(s) Signed: 11/19/2018 5:21:59 PM By: Harold Barban Signed: 11/20/2018 8:15:47 AM By: Tobi Bastos Entered By: Harold Barban on 11/19/2018 09:24:11 [name] ([id number]) -------------------------------------------------------------------------------- Progress Note Details Patient Name: [name] Date of Service: 11/19/2018 9:00 AM Medical Record Number: [id number] Patient Account Number: [id number] Date of Birth/Sex: 09-14-1950 (68 y.o. M) Treating RN: Harold Barban Primary Care Provider: SYSTEM, PCP Other Clinician: Referring Provider: Referral, Self Treating Provider/Extender: Beverly Gust in Treatment: 1 Subjective History of Present Illness (HPI) 68 year old male who presented to the ER with bilateral lower extremity blisters which had started last week. he has a past medical history of leukemia,  diabetes mellitus, hypertension, edema of both lower extremities, his recurrent skin infections, peripheral vascular disease, coronary artery disease, congestive heart failure and peripheral neuropathy. in the ER he was given Rocephin and put on Silvadene cream. he was put on oral doxycycline and was asked to follow-up with the [facility]. His last hemoglobin A1c was 6.6 in December and he checks his blood sugar once a week. He does not have any physicians outside the [place] system. He does not recall any vascular duplex studies done either for arterial or venous disease but was told to wear compression stockings which he does not use 05/30/2016 -- we have not yet received any of his notes from the [facility] hospital system and his arterial and venous duplex studies are scheduled here in [place] around mid February. We are unable to have his insurance accepted by home health agencies and hence he is getting dressings only once a week. 06/06/16 -- -- I received a call from the patient's PCP at the [facility] at [facility] and spoke to Dr. Garvin Fila, phone number [phone] and fax number [phone]. She confirmed that no vascular testing was done over the last 5 years and she would be happy to do them if the patient did want them to be done at the [place] and we could fax him a request. Readmission: 68 year old male seen by as in February of this year and was referred to vein and vascular for studies and opinion from the vascular surgeons. The patient returns today with a fresh problem having had blisters on his left lower extremity which have been there for about 5 days and he clearly states that he has been wearing his compression stockings as advised though he could not read the moderate compression and has been wearing light compression. Review of his electronic medical records note that he had lower extremity arterial duplex examination done on 06/23/2016 which showed no hemodynamically significant stenosis  in the bilateral lower extremity arterial system. He also had a lower extremity venous reflux examination done on 07/07/2016 and it was noted that he had venous incompetence in the right great saphenous vein and bilateral common femoral veins. Patient was seen by Dr. Tamala Julian on the same day and for some reason his notes do not reflect the venous studies or the arterial studies and he recommended patient do a venous duplex ultrasound to look for reflux and return to see him.he would also consider a lymph pump if required. The patient was told that his workup was normal and hence the patient canceled his follow-up appointment. 02/03/17 on evaluation today patient left medial lower extremity blister appears to be doing about the same. It is still continuing to drain and there's still the blistered skin covering the wound bed which is making it difficult for the alternate to do its job. Fortunately there is no evidence of cellulitis. No fevers chills noted. Patient states in general he is not having any significant discomfort. Patient's lower extremity arterial duplex exam revealed that patient was hemodynamically stable with no evidence of stenosis in regard to the bilateral lower extremities. The lower extremity venous reflux exam revealed the patient had venous incontinence noted in the right greater saphenous and bilateral common femoral vein. There is no evidence of deep or superficial vein thrombosis in the bilateral lower extremities. Readmission: KINNETH, FUJIWARA ([id number]) 11/12/18 Patient presents for evaluation our clinic today concerning issues that he is having with his left lower extremity. He tells me that a couple weeks ago he began  developing blisters on the left lower extremity along with increased swelling. He typically wears his compression stockings on a regular basis is previously been evaluated both here as well is with vascular surgery they would recommend lymphedema pumps but  unfortunately that somehow fell through and he never heard anything back from that. Nonetheless I think lymphedema pumps would be beneficial for this patient. He does have a history of hypertension and diabetes. Obviously the chronic venous stasis and lymphedema as well. At this point the blisters have been given in more trouble he states sometimes when the blisters openings able to clean it down with alcohol and it will dry out and do well. Unfortunately that has not been the case this time. He is having some discomfort although this mean these with cleaning the areas he doesn't have discomfort just on a regular basis. He has not been able to wear his compression stockings since the blisters arose due to the fact that of course it will drain into the socks causing additional issues and he didn't have any way to wrap this otherwise. He has increased to taking his Lasix every day instead of every other day. He sees his primary care provider later this month as well. No fevers, chills, nausea, or vomiting noted at this time. 11/19/18-Patient returns at 1 week, per intake RN the amount of seepage into the compression wraps was definitely improved, overall all the wounds are measuring smaller but continuing silver alginate to the wounds as primary dressing Objective Constitutional alert and oriented x 3. sitting or standing blood pressure is within target range for patient.. supine blood pressure is within target range for patient.. pulse regular and within target range for patient.Marland Kitchen respirations regular, non-labored and within target range for patient.Marland Kitchen temperature within target range for patient.. Well-nourished and well-hydrated in no acute distress. Vitals Time Taken: 9:02 AM, Height: 73 in, Weight: 300 lbs, BMI: 39.6, Temperature: 98.4 F, Pulse: 49 bpm, Respiratory Rate: 18 breaths/min, Blood Pressure: 133/50 mmHg. General Notes: The left lateral leg wounds numbering 4 are definitely looking  better, the base appears healthy, the periwound does not have any significant inflammation, no slough or necrotic areas noted in the wounds, the smallest of the wounds are the most proximal they are almost closed. Integumentary (Hair, Skin) Wound #4 status is Open. Original cause of wound was Blister. The wound is located on the Left,Medial Lower Leg. The wound measures 0.8cm length x 0.7cm width x 0.1cm depth; 0.44cm^2 area and 0.044cm^3 volume. There is Fat Layer (Subcutaneous Tissue) Exposed exposed. There is no tunneling or undermining noted. There is a large amount of serous drainage noted. The wound margin is flat and intact. There is large (67-100%) pink granulation within the wound bed. There is a small (1-33%) amount of necrotic tissue within the wound bed including Adherent Slough. Wound #5 status is Open. Original cause of wound was Blister. The wound is located on the Left,Proximal,Lateral Lower Leg. The wound measures 0.1cm length x 0.1cm width x 0.1cm depth; 0.008cm^2 area and 0.001cm^3 volume. There is Fat Layer (Subcutaneous Tissue) Exposed exposed. There is no tunneling or undermining noted. There is a large amount of serous drainage noted. The wound margin is flat and intact. There is medium (34-66%) pink granulation within the wound bed. There is a medium (34-66%) amount of necrotic tissue within the wound bed including Adherent Slough. Wound #6 status is Open. Original cause of wound was Blister. The wound is located on the Left,Lateral Lower Leg. The wound  measures 2.7cm length x 2.1cm width x 0.1cm depth; 4.453cm^2 area and 0.445cm^3 volume. There is Fat Layer (Subcutaneous Tissue) Exposed exposed. There is no tunneling or undermining noted. There is a large amount of serous drainage noted. The wound margin is flat and intact. There is large (67-100%) pink granulation within the wound bed. There is a small (1-33%) amount of necrotic tissue within the wound bed including  Adherent Slough. Wound #7 status is Open. Original cause of wound was Blister. The wound is located on the Left,Distal,Lateral Lower Leg. [place], [name] ([id number]) The wound measures 4.2cm length x 3.1cm width x 0.1cm depth; 10.226cm^2 area and 1.023cm^3 volume. There is Fat Layer (Subcutaneous Tissue) Exposed exposed. There is no tunneling or undermining noted. There is a large amount of serous drainage noted. The wound margin is flat and intact. There is small (1-33%) pink granulation within the wound bed. There is a large (67-100%) amount of necrotic tissue within the wound bed including Adherent Slough. Plan Wound Cleansing: Wound #4 Left,Medial Lower Leg: May shower with protection. - Can cover with bag and tape tightly so not to leak or purchase a cast protector to keep wrap dry Wound #5 Left,Proximal,Lateral Lower Leg: May shower with protection. - Can cover with bag and tape tightly so not to leak or purchase a cast protector to keep wrap dry Wound #6 Left,Lateral Lower Leg: May shower with protection. - Can cover with bag and tape tightly so not to leak or purchase a cast protector to keep wrap dry Wound #7 Left,Distal,Lateral Lower Leg: May shower with protection. - Can cover with bag and tape tightly so not to leak or purchase a cast protector to keep wrap dry Primary Wound Dressing: Wound #4 Left,Medial Lower Leg: Silver Alginate Wound #5 Left,Proximal,Lateral Lower Leg: Silver Alginate Wound #6 Left,Lateral Lower Leg: Silver Alginate Wound #7 Left,Distal,Lateral Lower Leg: Silver Alginate Dressing Change Frequency: Wound #4 Left,Medial Lower Leg: Change dressing every week Wound #5 Left,Proximal,Lateral Lower Leg: Change dressing every week Wound #6 Left,Lateral Lower Leg: Change dressing every week Wound #7 Left,Distal,Lateral Lower Leg: Change dressing every week Follow-up Appointments: Wound #4 Left,Medial Lower Leg: Return Appointment in 1 week. Wound #5  Left,Proximal,Lateral Lower Leg: Return Appointment in 1 week. Wound #6 Left,Lateral Lower Leg: Return Appointment in 1 week. Wound #7 Left,Distal,Lateral Lower Leg: Return Appointment in 1 week. Edema Control: Wound #4 Left,Medial Lower Leg: 3 Layer Compression System - Left Lower Extremity Unna Boot to Left Lower Extremity - Unna only at top to anchor Compression Pump: Use compression pump on left lower extremity for 60 minutes, twice daily. - Wound Care will order pumps for Patient Compression Pump: Use compression pump on right lower extremity for 60 minutes, twice daily. - Wound Care will order pumps for Patient HARVIS, MABUS ([id number]) Wound #5 Left,Proximal,Lateral Lower Leg: 3 Layer Compression System - Left Lower Extremity Unna Boot to Left Lower Extremity - Unna only at top to anchor Wound #6 Left,Lateral Lower Leg: 3 Layer Compression System - Left Lower Extremity Unna Boot to Left Lower Extremity - Unna only at top to anchor Wound #7 Left,Distal,Lateral Lower Leg: 3 Layer Compression System - Left Lower Extremity Unna Boot to Left Lower Extremity - Unna only at top to anchor Off-Loading: Wound #4 Left,Medial Lower Leg: Other: - Elevate leg while sitting, to aid in the reduction of swelling. Wound #5 Left,Proximal,Lateral Lower Leg: Other: - Elevate leg while sitting, to aid in the reduction of swelling. Wound #6 Left,Lateral Lower Leg: Other: -  Elevate leg while sitting, to aid in the reduction of swelling. Wound #7 Left,Distal,Lateral Lower Leg: Other: - Elevate leg while sitting, to aid in the reduction of swelling. 1. Continue with silver alginate dressing under 3 layer compression to the left leg 2. Return to clinic next week 3. Patient asked about his furosemide which he is using 20 mg daily and has not seen any changes in his edema nor his weight I have encouraged him to increase it to 40 daily but strongly advised him to connect with his primary care physician  to monitor those response. Electronic Signature(s) Signed: 11/19/2018 9:26:33 AM By: Tobi Bastos Entered By: Tobi Bastos on 11/19/2018 09:26:32 [name] ([id number]) -------------------------------------------------------------------------------- SuperBill Details Patient Name: [name] Date of Service: 11/19/2018 Medical Record Number: [id number] Patient Account Number: [id number] Date of Birth/Sex: 10-23-1950 (68 y.o. M) Treating RN: Harold Barban Primary Care Provider: SYSTEM, PCP Other Clinician: Referring Provider: Referral, Self Treating Provider/Extender: Beverly Gust in Treatment: 1 Diagnosis Coding ICD-10 Codes Code Description E11.622 Type 2 diabetes mellitus with other skin ulcer I89.0 Lymphedema, not elsewhere classified I87.2 Venous insufficiency (chronic) (peripheral) L97.822 Non-pressure chronic ulcer of other part of left lower leg with fat layer exposed I10 Essential (primary) hypertension Physician Procedures CPT4 Code Description: [id number] - WC PHYS LEVEL 3 - EST PT ICD-10 Diagnosis Description L97.822 Non-pressure chronic ulcer of other part of left lower leg wit Modifier: h fat layer expos Quantity: 1 ed Electronic Signature(s) Signed: 11/19/2018 9:26:52 AM By: Tobi Bastos Entered By: Tobi Bastos on 11/19/2018 09:26:52

## 2018-11-21 DIAGNOSIS — H26491 Other secondary cataract, right eye: Secondary | ICD-10-CM | POA: Diagnosis not present

## 2018-11-21 DIAGNOSIS — H2512 Age-related nuclear cataract, left eye: Secondary | ICD-10-CM | POA: Diagnosis not present

## 2018-11-26 ENCOUNTER — Other Ambulatory Visit: Payer: Self-pay

## 2018-11-26 ENCOUNTER — Encounter: Payer: PPO | Admitting: Physician Assistant

## 2018-11-26 DIAGNOSIS — L97222 Non-pressure chronic ulcer of left calf with fat layer exposed: Secondary | ICD-10-CM | POA: Diagnosis not present

## 2018-11-26 DIAGNOSIS — L97822 Non-pressure chronic ulcer of other part of left lower leg with fat layer exposed: Secondary | ICD-10-CM | POA: Diagnosis not present

## 2018-11-26 DIAGNOSIS — L97821 Non-pressure chronic ulcer of other part of left lower leg limited to breakdown of skin: Secondary | ICD-10-CM | POA: Diagnosis not present

## 2018-11-26 DIAGNOSIS — I872 Venous insufficiency (chronic) (peripheral): Secondary | ICD-10-CM | POA: Diagnosis not present

## 2018-11-28 NOTE — Progress Notes (Signed)
ANGUS, AMINI ([id number]) Visit Report for 11/26/2018 Arrival Information Details Patient Name: VANDELL, KUN Date of Service: 11/26/2018 9:00 AM Medical Record Number: [id number] Patient Account Number: [id number] Date of Birth/Sex: Jun 18, 1950 (68 y.o. M) Treating RN: Cornell Barman Primary Care Suvi Archuletta: SYSTEM, PCP Other Clinician: Referring Amzie Sillas: Referral, Self Treating Brodey Bonn/Extender: STONE III, [name] Weeks in Treatment: 2 Visit Information History Since Last Visit Added or deleted any medications: No Patient Arrived: Ambulatory Any new allergies or adverse reactions: No Arrival Time: 09:05 Had a fall or experienced change in No Accompanied By: self activities of daily living that may affect Transfer Assistance: None risk of falls: Patient Identification Verified: Yes Signs or symptoms of abuse/neglect since last visito No Secondary Verification Process Completed: Yes Hospitalized since last visit: No Patient Has Alerts: Yes Implantable device outside of the clinic excluding No Patient Alerts: DMII cellular tissue based products placed in the center since last visit: Has Dressing in Place as Prescribed: Yes Pain Present Now: No Electronic Signature(s) Signed: 11/26/2018 4:21:32 PM By: Lorine Bears RCP, RRT, CHT Entered By: Lorine Bears on 11/26/2018 09:05:46 [name] ([id number]) -------------------------------------------------------------------------------- Encounter Discharge Information Details Patient Name: [name] Date of Service: 11/26/2018 9:00 AM Medical Record Number: [id number] Patient Account Number: [id number] Date of Birth/Sex: 1950/12/29 (68 y.o. M) Treating RN: Army Melia Primary Care Araya Roel: SYSTEM, PCP Other Clinician: Referring Annisten Manchester: Referral, Self Treating Ronit Marczak/Extender: STONE III, [name] Weeks in Treatment: 2 Encounter Discharge Information Items Discharge Condition: Stable Ambulatory Status:  Ambulatory Discharge Destination: Home Transportation: Other Accompanied By: self Schedule Follow-up Appointment: Yes Clinical Summary of Care: Electronic Signature(s) Signed: 11/26/2018 10:17:25 AM By: Army Melia Entered By: Army Melia on 11/26/2018 09:45:15 [name] ([id number]) -------------------------------------------------------------------------------- Lower Extremity Assessment Details Patient Name: [name] Date of Service: 11/26/2018 9:00 AM Medical Record Number: [id number] Patient Account Number: [id number] Date of Birth/Sex: December 14, 1950 (68 y.o. M) Treating RN: Montey Hora Primary Care Atticus Wedin: SYSTEM, PCP Other Clinician: Referring Leliana Kontz: Referral, Self Treating Remo Kirschenmann/Extender: STONE III, [name] Weeks in Treatment: 2 Edema Assessment Assessed: [Left: No] [Right: No] Edema: [Left: Ye] [Right: s] Calf Left: Right: Point of Measurement: 30 cm From Medial Instep 39.7 cm cm Ankle Left: Right: Point of Measurement: 11 cm From Medial Instep 31.7 cm cm Vascular Assessment Pulses: Dorsalis Pedis Palpable: [Left:Yes] Electronic Signature(s) Signed: 11/26/2018 4:17:58 PM By: Montey Hora Entered By: Montey Hora on 11/26/2018 09:14:09 [name] ([id number]) -------------------------------------------------------------------------------- Multi Wound Chart Details Patient Name: [name] Date of Service: 11/26/2018 9:00 AM Medical Record Number: [id number] Patient Account Number: [id number] Date of Birth/Sex: [birth year] (68 y.o. M) Treating RN: Cornell Barman Primary Care Addilee Neu: SYSTEM, PCP Other Clinician: Referring Bobetta Korf: Referral, Self Treating Lazarus Sudbury/Extender: STONE III, [name] Weeks in Treatment: 2 Vital Signs Height(in): 73 Pulse(bpm): 36 Weight(lbs): 300 Blood Pressure(mmHg): 125/63 Body Mass Index(BMI): 40 Temperature(F): 98.3 Respiratory Rate 18 (breaths/min): Photos: Wound Location: Left, Medial Lower Leg Left Lower  Leg - Lateral, Left Lower Leg - Lateral Proximal Wounding Event: Blister Blister Blister Primary Etiology: Diabetic Wound/Ulcer of the Diabetic Wound/Ulcer of the Diabetic Wound/Ulcer of the Lower Extremity Lower Extremity Lower Extremity Secondary Etiology: Venous Leg Ulcer Venous Leg Ulcer Venous Leg Ulcer Comorbid History: Hypertension, Peripheral Hypertension, Peripheral Hypertension, Peripheral Venous Disease, Type II Venous Disease, Type II Venous Disease, Type II Diabetes, Neuropathy, Diabetes, Neuropathy, Diabetes, Neuropathy, Received Chemotherapy Received Chemotherapy Received Chemotherapy Date Acquired: 10/29/2018 10/29/2018 10/29/2018 Weeks of Treatment: 2 2 2  Wound Status: Healed - Epithelialized Open Open Clustered Wound: No No Yes Clustered Quantity: N/A  N/A 2 Measurements L x W x D 0x0x0 0x0x0 0.8x0.8x0.1 (cm) Area (cm) : 0 0 0.503 Volume (cm) : 0 0 0.05 % Reduction in Area: 100.00% 100.00% 97.20% % Reduction in Volume: 100.00% 100.00% 97.20% Classification: Grade 1 Grade 1 Grade 1 Exudate Amount: None Present None Present Large Exudate Type: N/A N/A Serous Exudate Color: N/A N/A amber Wound Margin: Flat and Intact Flat and Intact Flat and Intact Granulation Amount: None Present (0%) None Present (0%) Large (67-100%) Granulation Quality: N/A N/A Pink Necrotic Amount: None Present (0%) None Present (0%) Small (1-33%) SHIRAZ, BASTYR ([id number]) Exposed Structures: Fascia: No Fascia: No Fat Layer (Subcutaneous Fat Layer (Subcutaneous Fat Layer (Subcutaneous Tissue) Exposed: Yes Tissue) Exposed: No Tissue) Exposed: No Fascia: No Tendon: No Tendon: No Tendon: No Muscle: No Muscle: No Muscle: No Joint: No Joint: No Joint: No Bone: No Bone: No Bone: No Limited to Skin Breakdown Limited to Skin Breakdown Epithelialization: Large (67-100%) Large (67-100%) None Wound Number: 7 N/A N/A Photos: N/A N/A Wound Location: Left Lower Leg - Lateral, Distal N/A  N/A Wounding Event: Blister N/A N/A Primary Etiology: Diabetic Wound/Ulcer of the N/A N/A Lower Extremity Secondary Etiology: Venous Leg Ulcer N/A N/A Comorbid History: Hypertension, Peripheral N/A N/A Venous Disease, Type II Diabetes, Neuropathy, Received Chemotherapy Date Acquired: 10/29/2018 N/A N/A Weeks of Treatment: 2 N/A N/A Wound Status: Open N/A N/A Clustered Wound: No N/A N/A Clustered Quantity: N/A N/A N/A Measurements L x W x D 3.4x2.8x0.1 N/A N/A (cm) Area (cm) : 7.477 N/A N/A Volume (cm) : 0.748 N/A N/A % Reduction in Area: 65.40% N/A N/A % Reduction in Volume: 65.40% N/A N/A Classification: Grade 1 N/A N/A Exudate Amount: Large N/A N/A Exudate Type: Serous N/A N/A Exudate Color: amber N/A N/A Wound Margin: Flat and Intact N/A N/A Granulation Amount: Medium (34-66%) N/A N/A Granulation Quality: Pink N/A N/A Necrotic Amount: Medium (34-66%) N/A N/A Exposed Structures: Fat Layer (Subcutaneous N/A N/A Tissue) Exposed: Yes Fascia: No Tendon: No Muscle: No Joint: No Bone: No Epithelialization: None N/A N/A Treatment Notes GERY, SABEDRA ([id number]) Electronic Signature(s) Signed: 11/27/2018 9:33:28 AM By: Gretta Cool, BSN, RN, CWS, Kim RN, BSN Entered By: Gretta Cool, BSN, RN, CWS, Kim on 11/26/2018 09:28:05 [name] ([id number]) -------------------------------------------------------------------------------- [place] Details Patient Name: [name] Date of Service: 11/26/2018 9:00 AM Medical Record Number: [id number] Patient Account Number: [id number] Date of Birth/Sex: [birth year] (68 y.o. M) Treating RN: Cornell Barman Primary Care Krystelle Prashad: SYSTEM, PCP Other Clinician: Referring Lucill Mauck: Referral, Self Treating Shanina Kepple/Extender: STONE III, [name] Weeks in Treatment: 2 Active Inactive Venous Leg Ulcer Nursing Diagnoses: Actual venous Insuffiency (use after diagnosis is confirmed) Knowledge deficit related to disease process and  management Goals: Patient will maintain optimal edema control Date Initiated: 11/12/2018 Target Resolution Date: 12/13/2018 Goal Status: Active Patient/caregiver will verbalize understanding of disease process and disease management Date Initiated: 11/12/2018 Target Resolution Date: 12/13/2018 Goal Status: Active Interventions: Assess peripheral edema status every visit. Compression as ordered Provide education on venous insufficiency Notes: Wound/Skin Impairment Nursing Diagnoses: Impaired tissue integrity Knowledge deficit related to ulceration/compromised skin integrity Goals: Ulcer/skin breakdown will have a volume reduction of 30% by week 4 Date Initiated: 11/12/2018 Target Resolution Date: 12/13/2018 Goal Status: Active Interventions: Assess patient/caregiver ability to obtain necessary supplies Assess patient/caregiver ability to perform ulcer/skin care regimen upon admission and as needed Assess ulceration(s) every visit Provide education on ulcer and skin care Notes: HASSON, GASPARD ([id number]) Electronic Signature(s) Signed: 11/27/2018 9:33:28 AM By: Gretta Cool, BSN, RN, CWS, Kim RN, BSN  Entered By: Gretta Cool, BSN, RN, CWS, Kim on 11/26/2018 09:27:52 [name] ([id number]) -------------------------------------------------------------------------------- Pain Assessment Details Patient Name: ROCCO, KERKHOFF Date of Service: 11/26/2018 9:00 AM Medical Record Number: [id number] Patient Account Number: [id number] Date of Birth/Sex: Oct 31, 1950 (68 y.o. M) Treating RN: Cornell Barman Primary Care Paislee Szatkowski: SYSTEM, PCP Other Clinician: Referring Idalee Foxworthy: Referral, Self Treating Cristyn Crossno/Extender: STONE III, [name] Weeks in Treatment: 2 Active Problems Location of Pain Severity and Description of Pain Patient Has Paino No Site Locations Pain Management and Medication Current Pain Management: Electronic Signature(s) Signed: 11/26/2018 4:21:32 PM By: Lorine Bears RCP, RRT,  CHT Signed: 11/27/2018 9:33:28 AM By: Gretta Cool, BSN, RN, CWS, Kim RN, BSN Entered By: Lorine Bears on 11/26/2018 09:06:00 [name] ([id number]) -------------------------------------------------------------------------------- Patient/Caregiver Education Details Patient Name: [name] Date of Service: 11/26/2018 9:00 AM Medical Record Number: [id number] Patient Account Number: [id number] Date of Birth/Gender: 1951-04-29 (68 y.o. M) Treating RN: Cornell Barman Primary Care Physician: SYSTEM, PCP Other Clinician: Referring Physician: Referral, Self Treating Physician/Extender: Melburn Hake, [name] Weeks in Treatment: 2 Education Assessment Education Provided To: Patient Education Topics Provided Venous: Handouts: Controlling Swelling with Multilayered Compression Wraps Methods: Demonstration, Explain/Verbal Responses: State content correctly Wound/Skin Impairment: Handouts: Caring for Your Ulcer Methods: Demonstration, Explain/Verbal Responses: State content correctly Electronic Signature(s) Signed: 11/27/2018 9:33:28 AM By: Gretta Cool, BSN, RN, CWS, Kim RN, BSN Entered By: Gretta Cool, BSN, RN, CWS, Kim on 11/26/2018 09:32:46 [name] ([id number]) -------------------------------------------------------------------------------- Wound Assessment Details Patient Name: [name] Date of Service: 11/26/2018 9:00 AM Medical Record Number: [id number] Patient Account Number: [id number] Date of Birth/Sex: 1950-10-19 (68 y.o. M) Treating RN: Cornell Barman Primary Care Shamar Kracke: SYSTEM, PCP Other Clinician: Referring Bayli Quesinberry: Referral, Self Treating Timya Trimmer/Extender: STONE III, [name] Weeks in Treatment: 2 Wound Status Wound Number: 4 Primary Diabetic Wound/Ulcer of the Lower Extremity Etiology: Wound Location: Left, Medial Lower Leg Secondary Venous Leg Ulcer Wounding Event: Blister Etiology: Date Acquired: 10/29/2018 Wound Healed - Epithelialized Weeks Of Treatment:  2 Status: Clustered Wound: No Comorbid Hypertension, Peripheral Venous Disease, History: Type II Diabetes, Neuropathy, Received Chemotherapy Photos Wound Measurements Length: (cm) 0 % Reductio Width: (cm) 0 % Reductio Depth: (cm) 0 Epithelial Area: (cm) 0 Tunneling Volume: (cm) 0 Undermini n in Area: 100% n in Volume: 100% ization: Large (67-100%) : No ng: No Wound Description Classification: Grade 1 Foul Odor Wound Margin: Flat and Intact Slough/Fi Exudate Amount: None Present After Cleansing: No brino No Wound Bed Granulation Amount: None Present (0%) Exposed Structure Necrotic Amount: None Present (0%) Fascia Exposed: No Fat Layer (Subcutaneous Tissue) Exposed: No Tendon Exposed: No Muscle Exposed: No Joint Exposed: No Bone Exposed: No Limited to Skin CAP, MASSI ([id number]) Electronic Signature(s) Signed: 11/27/2018 9:33:28 AM By: Gretta Cool, BSN, RN, CWS, Kim RN, BSN Entered By: Gretta Cool, BSN, RN, CWS, Kim on 11/26/2018 09:27:39 [name] ([id number]) -------------------------------------------------------------------------------- Wound Assessment Details Patient Name: [name] Date of Service: 11/26/2018 9:00 AM Medical Record Number: [id number] Patient Account Number: [id number] Date of Birth/Sex: [birth year] (68 y.o. M) Treating RN: Montey Hora Primary Care Reinhard Schack: SYSTEM, PCP Other Clinician: Referring Laureen Frederic: Referral, Self Treating Ching Rabideau/Extender: STONE III, [name] Weeks in Treatment: 2 Wound Status Wound Number: 5 Primary Diabetic Wound/Ulcer of the Lower Extremity Etiology: Wound Location: Left Lower Leg - Lateral, Proximal Secondary Venous Leg Ulcer Wounding Event: Blister Etiology: Date Acquired: 10/29/2018 Wound Open Weeks Of Treatment: 2 Status: Clustered Wound: No Comorbid Hypertension, Peripheral Venous Disease, History: Type II Diabetes, Neuropathy, Received Chemotherapy Photos Wound Measurements Length:  (cm) Width: (cm) Depth: (cm)  Area: (cm) Volume: (cm) 0 % Reduction in Area: 100% 0 % Reduction in Volume: 100% 0 Epithelialization: Large (67-100%) 0 Tunneling: No 0 Undermining: No Wound Description Classification: Grade 1 Wound Margin: Flat and Intact Exudate Amount: None Present Foul Odor After Cleansing: No Slough/Fibrino No Wound Bed Granulation Amount: None Present (0%) Exposed Structure Necrotic Amount: None Present (0%) Fascia Exposed: No Fat Layer (Subcutaneous Tissue) Exposed: No Tendon Exposed: No Muscle Exposed: No Joint Exposed: No Bone Exposed: No Limited to Skin OSHAE, SIMMERING ([id number]) Electronic Signature(s) Signed: 11/26/2018 4:17:58 PM By: Montey Hora Entered By: Montey Hora on 11/26/2018 09:19:44 [name] ([id number]) -------------------------------------------------------------------------------- Wound Assessment Details Patient Name: [name] Date of Service: 11/26/2018 9:00 AM Medical Record Number: [id number] Patient Account Number: [id number] Date of Birth/Sex: 1950-06-14 (68 y.o. M) Treating RN: Montey Hora Primary Care Josue Kass: SYSTEM, PCP Other Clinician: Referring Kamela Blansett: Referral, Self Treating Ferdie Bakken/Extender: STONE III, [name] Weeks in Treatment: 2 Wound Status Wound Number: 6 Primary Diabetic Wound/Ulcer of the Lower Extremity Etiology: Wound Location: Left Lower Leg - Lateral Secondary Venous Leg Ulcer Wounding Event: Blister Etiology: Date Acquired: 10/29/2018 Wound Open Weeks Of Treatment: 2 Status: Clustered Wound: Yes Comorbid Hypertension, Peripheral Venous Disease, History: Type II Diabetes, Neuropathy, Received Chemotherapy Photos Wound Measurements Length: (cm) 0.8 % Reducti Width: (cm) 0.8 % Reducti Depth: (cm) 0.1 Epithelia Clustered Quantity: 2 Tunneling Area: (cm) 0.503 Undermin Volume: (cm) 0.05 on in Area: 97.2% on in Volume: 97.2% lization: None : No ing: No Wound  Description Classification: Grade 1 Foul Odor Wound Margin: Flat and Intact Slough/Fi Exudate Amount: Large Exudate Type: Serous Exudate Color: amber After Cleansing: No brino Yes Wound Bed Granulation Amount: Large (67-100%) Exposed Structure Granulation Quality: Pink Fascia Exposed: No Necrotic Amount: Small (1-33%) Fat Layer (Subcutaneous Tissue) Exposed: Yes Necrotic Quality: Adherent Slough Tendon Exposed: No Muscle Exposed: No Joint Exposed: No Bone Exposed: No Handy, Cheston ([id number]) Treatment Notes Wound #6 (Left, Lateral Lower Leg) Notes silver cell, ABD, 3 layer Electronic Signature(s) Signed: 11/26/2018 4:17:58 PM By: Montey Hora Entered By: Montey Hora on 11/26/2018 09:20:43 [name] ([id number]) -------------------------------------------------------------------------------- Wound Assessment Details Patient Name: [name] Date of Service: 11/26/2018 9:00 AM Medical Record Number: [id number] Patient Account Number: [id number] Date of Birth/Sex: 23-Dec-1950 (68 y.o. M) Treating RN: Montey Hora Primary Care Qais Jowers: SYSTEM, PCP Other Clinician: Referring Trestin Vences: Referral, Self Treating Vietta Bonifield/Extender: STONE III, [name] Weeks in Treatment: 2 Wound Status Wound Number: 7 Primary Diabetic Wound/Ulcer of the Lower Extremity Etiology: Wound Location: Left Lower Leg - Lateral, Distal Secondary Venous Leg Ulcer Wounding Event: Blister Etiology: Date Acquired: 10/29/2018 Wound Open Weeks Of Treatment: 2 Status: Clustered Wound: No Comorbid Hypertension, Peripheral Venous Disease, History: Type II Diabetes, Neuropathy, Received Chemotherapy Photos Wound Measurements Length: (cm) 3.4 Width: (cm) 2.8 Depth: (cm) 0.1 Area: (cm) 7.477 Volume: (cm) 0.748 % Reduction in Area: 65.4% % Reduction in Volume: 65.4% Epithelialization: None Tunneling: No Undermining: No Wound Description Classification: Grade 1 Foul Odor Wound Margin: Flat  and Intact Slough/Fi Exudate Amount: Large Exudate Type: Serous Exudate Color: amber After Cleansing: No brino Yes Wound Bed Granulation Amount: Medium (34-66%) Exposed Structure Granulation Quality: Pink Fascia Exposed: No Necrotic Amount: Medium (34-66%) Fat Layer (Subcutaneous Tissue) Exposed: Yes Necrotic Quality: Adherent Slough Tendon Exposed: No Muscle Exposed: No Joint Exposed: No Bone Exposed: No Saavedra, [name] ([id number]) Treatment Notes Wound #7 (Left, Distal, Lateral Lower Leg) Notes silver cell, ABD, 3 layer Electronic Signature(s) Signed: 11/26/2018 4:17:58 PM By: Montey Hora Entered By: Montey Hora  on 11/26/2018 09:21:21 TRESEAN, MATTIX ([id number]) -------------------------------------------------------------------------------- [place] Details Patient Name: FESTUS, PURSEL Date of Service: 11/26/2018 9:00 AM Medical Record Number: [id number] Patient Account Number: [id number] Date of Birth/Sex: 12-16-1950 (68 y.o. M) Treating RN: Cornell Barman Primary Care Linette Gunderson: SYSTEM, PCP Other Clinician: Referring Jorie Zee: Referral, Self Treating Martise Waddell/Extender: STONE III, [name] Weeks in Treatment: 2 Vital Signs Time Taken: 09:06 Temperature (F): 98.3 Height (in): 73 Pulse (bpm): 47 Weight (lbs): 300 Respiratory Rate (breaths/min): 18 Body Mass Index (BMI): 39.6 Blood Pressure (mmHg): 125/63 Reference Range: 80 - 120 mg / dl Electronic Signature(s) Signed: 11/26/2018 4:21:32 PM By: Lorine Bears RCP, RRT, CHT Entered By: Lorine Bears on 11/26/2018 09:09:06

## 2018-11-28 NOTE — Progress Notes (Signed)
[name], [name] ([id number]) Visit Report for 11/26/2018 Chief Complaint Document Details Patient Name: [name], [name] Date of Service: 11/26/2018 9:00 AM Medical Record Number: [id number] Patient Account Number: [id number] Date of Birth/Sex: 01/20/1951 (68 y.o. M) Treating RN: [name] Primary Care Provider: SYSTEM, PCP Other Clinician: Referring Provider: Referral, Self Treating Provider/Extender: Melburn Hake, Nirvi Boehler Weeks in Treatment: 2 Information Obtained from: Patient Chief Complaint Left LE ulcers Electronic Signature(s) Signed: 11/28/2018 7:41:37 AM By: Worthy Keeler PA-C Entered By: Worthy Keeler on 11/26/2018 09:26:17 [name] ([id number]) -------------------------------------------------------------------------------- HPI Details Patient Name: [name] Date of Service: 11/26/2018 9:00 AM Medical Record Number: [id number] Patient Account Number: [id number] Date of Birth/Sex: [birth year] (68 y.o. M) Treating RN: [name] Primary Care Provider: SYSTEM, PCP Other Clinician: Referring Provider: Referral, Self Treating Provider/Extender: [name], [name] Weeks in Treatment: 2 History of Present Illness HPI Description: 68 year old male who presented to the ER with bilateral lower extremity blisters which had started last week. he has a past medical history of leukemia, diabetes mellitus, hypertension, edema of both lower extremities, his recurrent skin infections, peripheral vascular disease, coronary artery disease, congestive heart failure and peripheral neuropathy. in the ER he was given Rocephin and put on Silvadene cream. he was put on oral doxycycline and was asked to follow-up with the [facility]. His last hemoglobin A1c was 6.6 in December and he checks his blood sugar once a week. He does not have any physicians outside the [place] system. He does not recall any vascular duplex studies done either for arterial or venous disease but was told to wear compression stockings  which he does not use 05/30/2016 -- we have not yet received any of his notes from the [facility] hospital system and his arterial and venous duplex studies are scheduled here in [place] around mid February. We are unable to have his insurance accepted by home health agencies and hence he is getting dressings only once a week. 06/06/16 -- -- I received a call from the patient's PCP at the [facility] at [facility] and spoke to Dr. Garvin Fila, phone number [phone] and fax number [phone]. She confirmed that no vascular testing was done over the last 5 years and she would be happy to do them if the patient did want them to be done at the [place] and we could fax him a request. Readmission: 68 year old male seen by as in February of this year and was referred to vein and vascular for studies and opinion from the vascular surgeons. The patient returns today with a fresh problem having had blisters on his left lower extremity which have been there for about 5 days and he clearly states that he has been wearing his compression stockings as advised though he could not read the moderate compression and has been wearing light compression. Review of his electronic medical records note that he had lower extremity arterial duplex examination done on 06/23/2016 which showed no hemodynamically significant stenosis in the bilateral lower extremity arterial system. He also had a lower extremity venous reflux examination done on 07/07/2016 and it was noted that he had venous incompetence in the right great saphenous vein and bilateral common femoral veins. Patient was seen by Dr. Tamala Julian on the same day and for some reason his notes do not reflect the venous studies or the arterial studies and he recommended patient do a venous duplex ultrasound to look for reflux and return to see him.he would also consider a lymph pump if required. The patient was told that  his workup was normal and hence the patient canceled his  follow-up appointment. 02/03/17 on evaluation today patient left medial lower extremity blister appears to be doing about the same. It is still continuing to drain and there's still the blistered skin covering the wound bed which is making it difficult for the alternate to do its job. Fortunately there is no evidence of cellulitis. No fevers chills noted. Patient states in general he is not having any significant discomfort. Patient's lower extremity arterial duplex exam revealed that patient was hemodynamically stable with no evidence of stenosis in regard to the bilateral lower extremities. The lower extremity venous reflux exam revealed the patient had venous incontinence noted in the right greater saphenous and bilateral common femoral vein. There is no evidence of deep or superficial vein thrombosis in the bilateral lower extremities. Readmission: [name], [name] ([id number]) 11/12/18 Patient presents for evaluation our clinic today concerning issues that he is having with his left lower extremity. He tells me that a couple weeks ago he began developing blisters on the left lower extremity along with increased swelling. He typically wears his compression stockings on a regular basis is previously been evaluated both here as well is with vascular surgery they would recommend lymphedema pumps but unfortunately that somehow fell through and he never heard anything back from that. Nonetheless I think lymphedema pumps would be beneficial for this patient. He does have a history of hypertension and diabetes. Obviously the chronic venous stasis and lymphedema as well. At this point the blisters have been given in more trouble he states sometimes when the blisters openings able to clean it down with alcohol and it will dry out and do well. Unfortunately that has not been the case this time. He is having some discomfort although this mean these with cleaning the areas he doesn't have discomfort just on a  regular basis. He has not been able to wear his compression stockings since the blisters arose due to the fact that of course it will drain into the socks causing additional issues and he didn't have any way to wrap this otherwise. He has increased to taking his Lasix every day instead of every other day. He sees his primary care provider later this month as well. No fevers, chills, nausea, or vomiting noted at this time. 11/19/18-Patient returns at 1 week, per intake RN the amount of seepage into the compression wraps was definitely improved, overall all the wounds are measuring smaller but continuing silver alginate to the wounds as primary dressing 11/26/18 on evaluation today patient appears to be doing quite well in regard to his left lower Trinity ulcers. In fact of the areas that were noted initially he only has two regions still open. There is no evidence of active infection at this time. He still is not heard anything from the company regarding lymphedema pumps as of yet. Again as previously seen vascular they have not recommended any surgical intervention. Electronic Signature(s) Signed: 11/28/2018 7:41:37 AM By: Worthy Keeler PA-C Entered By: Worthy Keeler on 11/26/2018 12:31:12 [name] ([id number]) -------------------------------------------------------------------------------- Physical Exam Details Patient Name: [name] Date of Service: 11/26/2018 9:00 AM Medical Record Number: [id number] Patient Account Number: [id number] Date of Birth/Sex: 22-Jun-1950 (68 y.o. M) Treating RN: [name] Primary Care Provider: SYSTEM, PCP Other Clinician: Referring Provider: Referral, Self Treating Provider/Extender: [name], Lular Letson Weeks in Treatment: 2 Constitutional Well-nourished and well-hydrated in no acute distress. Respiratory normal breathing without difficulty. clear to auscultation bilaterally. Cardiovascular regular rate and  rhythm with normal S1, S2. trace pitting  edema of the bilateral lower extremities. Psychiatric this patient is able to make decisions and demonstrates good insight into disease process. Alert and Oriented x 3. pleasant and cooperative. Notes No sharp debridement was necessary today during the office visit patient's wounds actually appear to be progressing quite nicely and very pleased in this regard. Overall his swelling is much better controlled with the compression wrap at this time he does wonder how much longer he's gonna have to wear this. Electronic Signature(s) Signed: 11/28/2018 7:41:37 AM By: Worthy Keeler PA-C Entered By: Worthy Keeler on 11/26/2018 12:31:44 [name] ([id number]) -------------------------------------------------------------------------------- Physician Orders Details Patient Name: [name] Date of Service: 11/26/2018 9:00 AM Medical Record Number: [id number] Patient Account Number: [id number] Date of Birth/Sex: Oct 01, 1950 (68 y.o. M) Treating RN: [name] Primary Care Provider: SYSTEM, PCP Other Clinician: Referring Provider: Referral, Self Treating Provider/Extender: [name], [name] Weeks in Treatment: 2 Verbal / Phone Orders: No Diagnosis Coding ICD-10 Coding Code Description E11.622 Type 2 diabetes mellitus with other skin ulcer I89.0 Lymphedema, not elsewhere classified I87.2 Venous insufficiency (chronic) (peripheral) L97.822 Non-pressure chronic ulcer of other part of left lower leg with fat layer exposed I10 Essential (primary) hypertension Wound Cleansing o May shower with protection. - Can cover with bag and tape tightly so not to leak or purchase a cast protector to keep wrap dry Primary Wound Dressing Wound #6 Left,Lateral Lower Leg o Silver Alginate Wound #7 Left,Distal,Lateral Lower Leg o Silver Alginate Secondary Dressing Wound #6 Left,Lateral Lower Leg o ABD pad Wound #7 Left,Distal,Lateral Lower Leg o ABD pad Dressing Change Frequency Wound #6  Left,Lateral Lower Leg o Change dressing every week Wound #7 Left,Distal,Lateral Lower Leg o Change dressing every week Follow-up Appointments Wound #6 Left,Lateral Lower Leg o Return Appointment in 1 week. Wound #7 Left,Distal,Lateral Lower Leg o Return Appointment in 1 week. Edema Control Wound #6 Left,Lateral Lower Leg KENRY, DAUBERT ([id number]) o 3 Layer Compression System - Left Lower Extremity o Unna Boot to Left Lower Extremity - Unna only at top to anchor Wound #7 Left,Distal,Lateral Lower Leg o 3 Layer Compression System - Left Lower Extremity o Unna Boot to Left Lower Extremity - Unna only at top to anchor Off-Loading Wound #6 Left,Lateral Lower Leg o Other: - Elevate leg while sitting, to aid in the reduction of swelling. Wound #7 Left,Distal,Lateral Lower Leg o Other: - Elevate leg while sitting, to aid in the reduction of swelling. Electronic Signature(s) Signed: 11/27/2018 9:33:28 AM By: Gretta Cool, BSN, RN, CWS, Kim RN, BSN Signed: 11/28/2018 7:41:37 AM By: Worthy Keeler PA-C Entered By: Gretta Cool BSN, RN, CWS, Kim on 11/26/2018 09:29:48 [name] ([id number]) -------------------------------------------------------------------------------- Problem List Details Patient Name: [name], [name] Date of Service: 11/26/2018 9:00 AM Medical Record Number: [id number] Patient Account Number: [id number] Date of Birth/Sex: 26-Jun-1950 (68 y.o. M) Treating RN: [name] Primary Care Provider: SYSTEM, PCP Other Clinician: Referring Provider: Referral, Self Treating Provider/Extender: Melburn Hake, Ettel Albergo Weeks in Treatment: 2 Active Problems ICD-10 Evaluated Encounter Code Description Active Date Today Diagnosis E11.622 Type 2 diabetes mellitus with other skin ulcer 11/12/2018 No Yes I89.0 Lymphedema, not elsewhere classified 11/12/2018 No Yes I87.2 Venous insufficiency (chronic) (peripheral) 11/12/2018 No Yes L97.822 Non-pressure chronic ulcer of other part of left lower  leg with 11/12/2018 No Yes fat layer exposed I10 Essential (primary) hypertension 11/12/2018 No Yes Inactive Problems Resolved Problems Electronic Signature(s) Signed: 11/28/2018 7:41:37 AM By: Worthy Keeler PA-C Entered By: Worthy Keeler  on 11/26/2018 09:26:02 [name], [name] ([id number]) -------------------------------------------------------------------------------- Progress Note Details Patient Name: [name], [name] Date of Service: 11/26/2018 9:00 AM Medical Record Number: [id number] Patient Account Number: [id number] Date of Birth/Sex: 1950-07-18 (68 y.o. M) Treating RN: [name] Primary Care Provider: SYSTEM, PCP Other Clinician: Referring Provider: Referral, Self Treating Provider/Extender: [name], Tracer Gutridge Weeks in Treatment: 2 Subjective Chief Complaint Information obtained from Patient Left LE ulcers History of Present Illness (HPI) 68 year old male who presented to the ER with bilateral lower extremity blisters which had started last week. he has a past medical history of leukemia, diabetes mellitus, hypertension, edema of both lower extremities, his recurrent skin infections, peripheral vascular disease, coronary artery disease, congestive heart failure and peripheral neuropathy. in the ER he was given Rocephin and put on Silvadene cream. he was put on oral doxycycline and was asked to follow-up with the [facility]. His last hemoglobin A1c was 6.6 in December and he checks his blood sugar once a week. He does not have any physicians outside the [place] system. He does not recall any vascular duplex studies done either for arterial or venous disease but was told to wear compression stockings which he does not use 05/30/2016 -- we have not yet received any of his notes from the [facility] hospital system and his arterial and venous duplex studies are scheduled here in [place] around mid February. We are unable to have his insurance accepted by home health agencies and hence he is getting  dressings only once a week. 06/06/16 -- -- I received a call from the patient's PCP at the [facility] and spoke to Dr. Garvin Fila, phone number [phone] and fax number [phone]. She confirmed that no vascular testing was done over the last 5 years and she would be happy to do them if the patient did want them to be done at the [place] and we could fax him a request. Readmission: 68 year old male seen by as in February of this year and was referred to vein and vascular for studies and opinion from the vascular surgeons. The patient returns today with a fresh problem having had blisters on his left lower extremity which have been there for about 5 days and he clearly states that he has been wearing his compression stockings as advised though he could not read the moderate compression and has been wearing light compression. Review of his electronic medical records note that he had lower extremity arterial duplex examination done on 06/23/2016 which showed no hemodynamically significant stenosis in the bilateral lower extremity arterial system. He also had a lower extremity venous reflux examination done on 07/07/2016 and it was noted that he had venous incompetence in the right great saphenous vein and bilateral common femoral veins. Patient was seen by Dr. Tamala Julian on the same day and for some reason his notes do not reflect the venous studies or the arterial studies and he recommended patient do a venous duplex ultrasound to look for reflux and return to see him.he would also consider a lymph pump if required. The patient was told that his workup was normal and hence the patient canceled his follow-up appointment. 02/03/17 on evaluation today patient left medial lower extremity blister appears to be doing about the same. It is still continuing to drain and there's still the blistered skin covering the wound bed which is making it difficult for the alternate to do its job. Fortunately there  is no evidence of cellulitis. No fevers chills noted. Patient states in general he is  not having any significant discomfort. Patient's lower extremity arterial duplex exam revealed that patient was hemodynamically stable with no evidence of stenosis in regard to the bilateral lower extremities. The lower extremity venous reflux exam revealed the patient had venous incontinence noted in the right greater saphenous [name], [name] ([id number]) and bilateral common femoral vein. There is no evidence of deep or superficial vein thrombosis in the bilateral lower extremities. Readmission: 11/12/18 Patient presents for evaluation our clinic today concerning issues that he is having with his left lower extremity. He tells me that a couple weeks ago he began developing blisters on the left lower extremity along with increased swelling. He typically wears his compression stockings on a regular basis is previously been evaluated both here as well is with vascular surgery they would recommend lymphedema pumps but unfortunately that somehow fell through and he never heard anything back from that. Nonetheless I think lymphedema pumps would be beneficial for this patient. He does have a history of hypertension and diabetes. Obviously the chronic venous stasis and lymphedema as well. At this point the blisters have been given in more trouble he states sometimes when the blisters openings able to clean it down with alcohol and it will dry out and do well. Unfortunately that has not been the case this time. He is having some discomfort although this mean these with cleaning the areas he doesn't have discomfort just on a regular basis. He has not been able to wear his compression stockings since the blisters arose due to the fact that of course it will drain into the socks causing additional issues and he didn't have any way to wrap this otherwise. He has increased to taking his Lasix every day instead of every other  day. He sees his primary care provider later this month as well. No fevers, chills, nausea, or vomiting noted at this time. 11/19/18-Patient returns at 1 week, per intake RN the amount of seepage into the compression wraps was definitely improved, overall all the wounds are measuring smaller but continuing silver alginate to the wounds as primary dressing 11/26/18 on evaluation today patient appears to be doing quite well in regard to his left lower Trinity ulcers. In fact of the areas that were noted initially he only has two regions still open. There is no evidence of active infection at this time. He still is not heard anything from the company regarding lymphedema pumps as of yet. Again as previously seen vascular they have not recommended any surgical intervention. Patient History Information obtained from Patient. Family History Cancer - Mother, Stroke - Father, No family history of Diabetes, Heart Disease, Hereditary Spherocytosis, Hypertension, Kidney Disease, Lung Disease, Seizures, Thyroid Problems, Tuberculosis. Social History Former smoker - quit 5 years ago, Marital Status - Divorced, Alcohol Use - Never, Drug Use - No History, Caffeine Use - Daily. Medical History Cardiovascular Patient has history of Hypertension, Peripheral Venous Disease Denies history of Angina, Arrhythmia, Congestive Heart Failure, Coronary Artery Disease, Deep Vein Thrombosis, Hypotension, Myocardial Infarction, Peripheral Arterial Disease, Phlebitis, Vasculitis Endocrine Patient has history of Type II Diabetes Integumentary (Skin) Denies history of History of Burn, History of pressure wounds Neurologic Patient has history of Neuropathy Denies history of Dementia, Quadriplegia, Paraplegia, Seizure Disorder Oncologic Patient has history of Received Chemotherapy Denies history of Received Radiation Medical And Surgical History Notes 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 Respiratory Denies complaints or symptoms of Chronic or frequent coughs, Shortness of Breath. Cardiovascular Complains or has symptoms of LE edema. Denies complaints or symptoms of Chest pain. Psychiatric Denies complaints or symptoms of Anxiety, Claustrophobia. Objective Constitutional Well-nourished and well-hydrated in no acute distress. Vitals Time Taken: 9:06 AM, Height: 73 in, Weight: 300 lbs, BMI: 39.6, Temperature: 98.3 F, Pulse: 47 bpm, Respiratory Rate: 18 breaths/min, Blood Pressure: 125/63 mmHg. Respiratory normal breathing without difficulty. clear to auscultation bilaterally. Cardiovascular regular rate and rhythm with normal S1, S2. trace pitting edema of the bilateral lower extremities. Psychiatric this patient is able to make decisions and demonstrates good insight into disease process. Alert and Oriented x 3. pleasant and cooperative. General Notes: No sharp debridement was necessary today during the office visit patient's wounds actually appear to be progressing quite nicely and very pleased in this regard. Overall his swelling is much better controlled with the compression wrap at this time he does wonder how much longer he's gonna have to wear this. Integumentary (Hair, Skin) Wound #4 status is Healed - Epithelialized. Original cause of wound was Blister. The wound is located on the Left,Medial Lower Leg. The wound measures 0cm length x 0cm width x 0cm depth; 0cm^2 area and 0cm^3 volume. The wound is limited to skin breakdown. There is no tunneling or undermining noted. There is a none present amount of drainage noted. The wound margin is flat and intact. There is no granulation within the wound bed. There is no necrotic tissue within the wound bed. Wound #5 status is Open. Original cause of wound was Blister.  The wound is located on the Left,Proximal,Lateral Lower Leg. The wound measures 0cm length x 0cm width x 0cm depth; 0cm^2 area and 0cm^3 volume. The wound is limited to skin breakdown. There is no tunneling or undermining noted. There is a none present amount of drainage noted. The wound margin is flat and intact. There is no granulation within the wound bed. There is no necrotic tissue within the wound bed. Wound #6 status is Open. Original cause of wound was Blister. The wound is located on the Left,Lateral Lower Leg. The wound measures 0.8cm length x 0.8cm width x 0.1cm depth; 0.503cm^2 area and 0.05cm^3 volume. There is Fat Layer (Subcutaneous Tissue) Exposed exposed. There is no tunneling or undermining noted. There is a large amount of serous drainage noted. The wound margin is flat and intact. There is large (67-100%) pink granulation within the wound bed. There is a small (1-33%) amount of necrotic tissue within the wound bed including Adherent Slough. [name], [name] ([id number]) Wound #7 status is Open. Original cause of wound was Blister. The wound is located on the Left,Distal,Lateral Lower Leg. The wound measures 3.4cm length x 2.8cm width x 0.1cm depth; 7.477cm^2 area and 0.748cm^3 volume. There is Fat Layer (Subcutaneous Tissue) Exposed exposed. There is no tunneling or undermining noted. There is a large amount of serous drainage noted. The wound margin is flat and intact. There is medium (34-66%) pink granulation within the wound bed. There is a medium (34-66%) amount of necrotic tissue within the wound bed including Adherent Slough. Assessment Active Problems ICD-10 Type 2 diabetes mellitus with other skin ulcer Lymphedema, not elsewhere classified Venous insufficiency (chronic) (peripheral) Non-pressure chronic ulcer of other part of left lower leg with fat layer exposed Essential (primary) hypertension Plan Wound Cleansing: May shower with protection. - Can cover with bag  and tape tightly so not to leak or purchase a cast protector to keep  wrap dry Primary Wound Dressing: Wound #6 Left,Lateral Lower Leg: Silver Alginate Wound #7 Left,Distal,Lateral Lower Leg: Silver Alginate Secondary Dressing: Wound #6 Left,Lateral Lower Leg: ABD pad Wound #7 Left,Distal,Lateral Lower Leg: ABD pad Dressing Change Frequency: Wound #6 Left,Lateral Lower Leg: Change dressing every week Wound #7 Left,Distal,Lateral Lower Leg: Change dressing every week Follow-up Appointments: Wound #6 Left,Lateral Lower Leg: Return Appointment in 1 week. Wound #7 Left,Distal,Lateral Lower Leg: Return Appointment in 1 week. Edema Control: Wound #6 Left,Lateral Lower Leg: 3 Layer Compression System - Left Lower Extremity Unna Boot to Left Lower Extremity - Unna only at top to anchor Wound #7 Left,Distal,Lateral Lower Leg: 3 Layer Compression System - Left Lower Extremity Unna Boot to Left Lower Extremity - Unna only at top to anchor Off-Loading: Wound #6 Left,Lateral Lower Leg: JOSPEH, MANGEL ([id number]) Other: - Elevate leg while sitting, to aid in the reduction of swelling. Wound #7 Left,Distal,Lateral Lower Leg: Other: - Elevate leg while sitting, to aid in the reduction of swelling. My suggestion is gonna be currently that we go ahead and continue with the above wound to measures since the patient seems to be doing so well he is in agreement with plan. If anything changes or worsens the meantime he will contact the office and let me know. Otherwise will see were things stand at follow-up. Please see above for specific wound care orders. We will see patient for re-evaluation in 1 week(s) here in the clinic. If anything worsens or changes patient will contact our office for additional recommendations. Electronic Signature(s) Signed: 11/28/2018 7:41:37 AM By: Worthy Keeler PA-C Entered By: Worthy Keeler on 11/26/2018 12:31:58 [name]  ([id number]) -------------------------------------------------------------------------------- ROS/PFSH Details Patient Name: [name] Date of Service: 11/26/2018 9:00 AM Medical Record Number: [id number] Patient Account Number: [id number] Date of Birth/Sex: [birth year] (68 y.o. M) Treating RN: [name] Primary Care Provider: SYSTEM, PCP Other Clinician: Referring Provider: Referral, Self Treating Provider/Extender: [name], Edward Guthmiller Weeks in Treatment: 2 Information Obtained From Patient Constitutional Symptoms (General Health) Complaints and Symptoms: Negative for: Fatigue; Fever; Chills; Marked Weight Change Respiratory Complaints and Symptoms: Negative for: Chronic or frequent coughs; Shortness of Breath Cardiovascular Complaints and Symptoms: Positive for: LE edema Negative for: Chest pain Medical History: Positive for: Hypertension; Peripheral Venous Disease Negative for: Angina; Arrhythmia; Congestive Heart Failure; Coronary Artery Disease; Deep Vein Thrombosis; Hypotension; Myocardial Infarction; Peripheral Arterial Disease; Phlebitis; Vasculitis Psychiatric Complaints and Symptoms: Negative for: Anxiety; Claustrophobia Endocrine Medical History: Positive for: Type II Diabetes Treated with: Oral agents Integumentary (Skin) Medical History: Negative for: History of Burn; History of pressure wounds Neurologic Medical History: Positive for: Neuropathy Negative for: Dementia; Quadriplegia; Paraplegia; Seizure Disorder Oncologic Medical History: Positive for: Received Chemotherapy MARQUE, RADEMAKER ([id number]) Negative for: Received Radiation Past Medical History Notes: 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 Immunizations Pneumococcal Vaccine: Received Pneumococcal Vaccination: Yes Immunization Notes: up to date Implantable Devices None Family and Social History Cancer: Yes - Mother; Diabetes: No; Heart Disease: No; Hereditary Spherocytosis: No; Hypertension: No;  Kidney Disease: No; Lung Disease: No; Seizures: No; Stroke: Yes - Father; Thyroid Problems: No; Tuberculosis: No; Former smoker - quit 5 years ago; Marital Status - Divorced; Alcohol Use: Never; Drug Use: No History; Caffeine Use: Daily; Financial Concerns: No; Food, Clothing or Shelter Needs: No; Support System Lacking: No; Transportation Concerns: No Physician Affirmation I have reviewed and agree with the above information. Electronic Signature(s) Signed: 11/27/2018 9:33:28 AM By: Gretta Cool, BSN, RN, CWS, Kim RN, BSN Signed: 11/28/2018 7:41:37 AM By: Joaquim Lai  Mooney, Windsor Goeken PA-C Entered By: Worthy Keeler on 11/26/2018 12:31:28 [name] ([id number]) -------------------------------------------------------------------------------- SuperBill Details Patient Name: [name] Date of Service: 11/26/2018 Medical Record Number: [id number] Patient Account Number: [id number] Date of Birth/Sex: 1950/07/08 (68 y.o. M) Treating RN: [name] Primary Care Provider: SYSTEM, PCP Other Clinician: Referring Provider: Referral, Self Treating Provider/Extender: [name], Elisavet Buehrer Weeks in Treatment: 2 Diagnosis Coding ICD-10 Codes Code Description E11.622 Type 2 diabetes mellitus with other skin ulcer I89.0 Lymphedema, not elsewhere classified I87.2 Venous insufficiency (chronic) (peripheral) L97.822 Non-pressure chronic ulcer of other part of left lower leg with fat layer exposed I10 Essential (primary) hypertension Facility Procedures CPT4 Code: [id number] Description: (Facility Use Only) [phone] - [place] [facility] LT LEG Modifier: Quantity: 1 Physician Procedures CPT4 Code Description: [phone] [id number] - WC PHYS LEVEL 4 - EST PT ICD-10 Diagnosis Description E11.622 Type 2 diabetes mellitus with other skin ulcer I89.0 Lymphedema, not elsewhere classified I87.2 Venous insufficiency (chronic) (peripheral) L97.822  Non-pressure chronic ulcer of other part of left lower leg wit Modifier: h fat layer  expos Quantity: 1 ed Electronic Signature(s) Signed: 11/28/2018 7:41:37 AM By: Worthy Keeler PA-C Entered By: Worthy Keeler on 11/26/2018 12:32:11

## 2018-12-03 ENCOUNTER — Encounter: Payer: PPO | Admitting: Physician Assistant

## 2018-12-03 ENCOUNTER — Other Ambulatory Visit: Payer: Self-pay

## 2018-12-03 DIAGNOSIS — L97822 Non-pressure chronic ulcer of other part of left lower leg with fat layer exposed: Secondary | ICD-10-CM | POA: Diagnosis not present

## 2018-12-03 DIAGNOSIS — L97221 Non-pressure chronic ulcer of left calf limited to breakdown of skin: Secondary | ICD-10-CM | POA: Diagnosis not present

## 2018-12-03 DIAGNOSIS — I872 Venous insufficiency (chronic) (peripheral): Secondary | ICD-10-CM | POA: Diagnosis not present

## 2018-12-03 NOTE — Progress Notes (Addendum)
[name], [name] ([id number]) Visit Report for 12/03/2018 Chief Complaint Document Details Patient Name: [name], [name] Date of Service: 12/03/2018 9:00 AM Medical Record Number: [id number] Patient Account Number: [id number] Date of Birth/Sex: 08/11/1950 (68 y.o. M) Treating RN: Harold Barban Primary Care Provider: SYSTEM, PCP Other Clinician: Referring Provider: Referral, Self Treating Provider/Extender: Melburn Hake, [name] Weeks in Treatment: 3 Information Obtained from: Patient Chief Complaint Left LE ulcers Electronic Signature(s) Signed: 12/03/2018 9:14:09 AM By: Worthy Keeler PA-C Entered By: Worthy Keeler on 12/03/2018 09:14:08 [name] ([id number]) -------------------------------------------------------------------------------- HPI Details Patient Name: [name] Date of Service: 12/03/2018 9:00 AM Medical Record Number: [id number] Patient Account Number: [id number] Date of Birth/Sex: January 12, 1951 (68 y.o. M) Treating RN: Harold Barban Primary Care Provider: SYSTEM, PCP Other Clinician: Referring Provider: Referral, Self Treating Provider/Extender: STONE III, [name] Weeks in Treatment: 3 History of Present Illness HPI Description: 68 year old male who presented to the ER with bilateral lower extremity blisters which had started last week. he has a past medical history of leukemia, diabetes mellitus, hypertension, edema of both lower extremities, his recurrent skin infections, peripheral vascular disease, coronary artery disease, congestive heart failure and peripheral neuropathy. in the ER he was given Rocephin and put on Silvadene cream. he was put on oral doxycycline and was asked to follow-up with the [facility]. His last hemoglobin A1c was 6.6 in December and he checks his blood sugar once a week. He does not have any physicians outside the [place] system. He does not recall any vascular duplex studies done either for arterial or venous disease but was told to wear  compression stockings which he does not use 05/30/2016 -- we have not yet received any of his notes from the [facility] hospital system and his arterial and venous duplex studies are scheduled here in [place] around mid February. We are unable to have his insurance accepted by home health agencies and hence he is getting dressings only once a week. 06/06/16 -- -- I received a call from the patient's PCP at the [name] at [facility] and spoke to Dr. Garvin Fila, phone number [phone] and fax number [phone]. She confirmed that no vascular testing was done over the last 5 years and she would be happy to do them if the patient did want them to be done at the [place] and we could fax him a request. Readmission: 68 year old male seen by as in February of this year and was referred to vein and vascular for studies and opinion from the vascular surgeons. The patient returns today with a fresh problem having had blisters on his left lower extremity which have been there for about 5 days and he clearly states that he has been wearing his compression stockings as advised though he could not read the moderate compression and has been wearing light compression. Review of his electronic medical records note that he had lower extremity arterial duplex examination done on 06/23/2016 which showed no hemodynamically significant stenosis in the bilateral lower extremity arterial system. He also had a lower extremity venous reflux examination done on 07/07/2016 and it was noted that he had venous incompetence in the right great saphenous vein and bilateral common femoral veins. Patient was seen by Dr. Tamala Julian on the same day and for some reason his notes do not reflect the venous studies or the arterial studies and he recommended patient do a venous duplex ultrasound to look for reflux and return to see him.he would also consider a lymph pump if required. The patient was told that  his workup was normal and hence the  patient canceled his follow-up appointment. 02/03/17 on evaluation today patient left medial lower extremity blister appears to be doing about the same. It is still continuing to drain and there's still the blistered skin covering the wound bed which is making it difficult for the alternate to do its job. Fortunately there is no evidence of cellulitis. No fevers chills noted. Patient states in general he is not having any significant discomfort. Patient's lower extremity arterial duplex exam revealed that patient was hemodynamically stable with no evidence of stenosis in regard to the bilateral lower extremities. The lower extremity venous reflux exam revealed the patient had venous incontinence noted in the right greater saphenous and bilateral common femoral vein. There is no evidence of deep or superficial vein thrombosis in the bilateral lower extremities. Readmission: [name], [name] ([id number]) 11/12/18 Patient presents for evaluation our clinic today concerning issues that he is having with his left lower extremity. He tells me that a couple weeks ago he began developing blisters on the left lower extremity along with increased swelling. He typically wears his compression stockings on a regular basis is previously been evaluated both here as well is with vascular surgery they would recommend lymphedema pumps but unfortunately that somehow fell through and he never heard anything back from that. Nonetheless I think lymphedema pumps would be beneficial for this patient. He does have a history of hypertension and diabetes. Obviously the chronic venous stasis and lymphedema as well. At this point the blisters have been given in more trouble he states sometimes when the blisters openings able to clean it down with alcohol and it will dry out and do well. Unfortunately that has not been the case this time. He is having some discomfort although this mean these with cleaning the areas he doesn't have  discomfort just on a regular basis. He has not been able to wear his compression stockings since the blisters arose due to the fact that of course it will drain into the socks causing additional issues and he didn't have any way to wrap this otherwise. He has increased to taking his Lasix every day instead of every other day. He sees his primary care provider later this month as well. No fevers, chills, nausea, or vomiting noted at this time. 11/19/18-Patient returns at 1 week, per intake RN the amount of seepage into the compression wraps was definitely improved, overall all the wounds are measuring smaller but continuing silver alginate to the wounds as primary dressing 11/26/18 on evaluation today patient appears to be doing quite well in regard to his left lower Trinity ulcers. In fact of the areas that were noted initially he only has two regions still open. There is no evidence of active infection at this time. He still is not heard anything from the company regarding lymphedema pumps as of yet. Again as previously seen vascular they have not recommended any surgical intervention. 12/03/2018 on evaluation today patient actually appears to be doing quite well with regard to his lower extremity ulcers. In fact most of the areas appear to be healed the one spot which does not seem to be completely healed I am unsure of whether or not this is really draining that much but nonetheless there does not appear to be any signs of infection or significant drainage at this point. There is no sign of fever, chills, nausea, vomiting, or diarrhea. Overall I am pleased with how things have progressed I think is very close  to being able to transition to his home compression stockings. Electronic Signature(s) Signed: 12/03/2018 10:10:24 AM By: Worthy Keeler PA-C Entered By: Worthy Keeler on 12/03/2018 10:10:24 [name]  ([id number]) -------------------------------------------------------------------------------- Physical Exam Details Patient Name: [name] Date of Service: 12/03/2018 9:00 AM Medical Record Number: [id number] Patient Account Number: [id number] Date of Birth/Sex: 1951-03-31 (68 y.o. M) Treating RN: Harold Barban Primary Care Provider: SYSTEM, PCP Other Clinician: Referring Provider: Referral, Self Treating Provider/Extender: STONE III, [name] Weeks in Treatment: 3 Constitutional Well-nourished and well-hydrated in no acute distress. Respiratory normal breathing without difficulty. clear to auscultation bilaterally. Cardiovascular regular rate and rhythm with normal S1, S2. Psychiatric this patient is able to make decisions and demonstrates good insight into disease process. Alert and Oriented x 3. pleasant and cooperative. Notes Patient's wound bed currently did not require any sharp debridement and effect appears to be for the most part heal. I think that his swelling is much better controlled and I do believe that he is close to complete resolution possibly even next week. Electronic Signature(s) Signed: 12/05/2018 9:47:59 AM By: Worthy Keeler PA-C Entered By: Worthy Keeler on 12/03/2018 20:36:41 [name] ([id number]) -------------------------------------------------------------------------------- Physician Orders Details Patient Name: [name] Date of Service: 12/03/2018 9:00 AM Medical Record Number: [id number] Patient Account Number: [id number] Date of Birth/Sex: Jul 12, 1950 (68 y.o. M) Treating RN: Harold Barban Primary Care Provider: SYSTEM, PCP Other Clinician: Referring Provider: Referral, Self Treating Provider/Extender: STONE III, [name] Weeks in Treatment: 3 Verbal / Phone Orders: No Diagnosis Coding ICD-10 Coding Code Description E11.622 Type 2 diabetes mellitus with other skin ulcer I89.0 Lymphedema, not elsewhere classified I87.2 Venous  insufficiency (chronic) (peripheral) L97.822 Non-pressure chronic ulcer of other part of left lower leg with fat layer exposed I10 Essential (primary) hypertension Wound Cleansing o May shower with protection. - Can cover with bag and tape tightly so not to leak or purchase a cast protector to keep wrap dry Primary Wound Dressing Wound #6 Left,Lateral Lower Leg o Silver Alginate Wound #7 Left,Distal,Lateral Lower Leg o Silver Alginate Secondary Dressing Wound #6 Left,Lateral Lower Leg o ABD pad Wound #7 Left,Distal,Lateral Lower Leg o ABD pad Dressing Change Frequency Wound #6 Left,Lateral Lower Leg o Change dressing every week Wound #7 Left,Distal,Lateral Lower Leg o Change dressing every week Follow-up Appointments Wound #6 Left,Lateral Lower Leg o Return Appointment in 1 week. Wound #7 Left,Distal,Lateral Lower Leg o Return Appointment in 1 week. Edema Control Wound #6 Left,Lateral Lower Leg KORTNEY, SCHOENFELDER ([id number]) o 3 Layer Compression System - Left Lower Extremity o Unna Boot to Left Lower Extremity - Unna only at top to anchor Wound #7 Left,Distal,Lateral Lower Leg o 3 Layer Compression System - Left Lower Extremity o Unna Boot to Left Lower Extremity - Unna only at top to anchor Off-Loading Wound #6 Left,Lateral Lower Leg o Other: - Elevate leg while sitting, to aid in the reduction of swelling. Wound #7 Left,Distal,Lateral Lower Leg o Other: - Elevate leg while sitting, to aid in the reduction of swelling. Electronic Signature(s) Signed: 12/04/2018 4:21:20 PM By: Harold Barban Signed: 12/05/2018 9:47:59 AM By: Worthy Keeler PA-C Entered By: Harold Barban on 12/03/2018 09:23:40 [name] ([id number]) -------------------------------------------------------------------------------- Problem List Details Patient Name: [name] Date of Service: 12/03/2018 9:00 AM Medical Record Number: [id number] Patient Account Number:  [id number] Date of Birth/Sex: [birth year] (68 y.o. M) Treating RN: Harold Barban Primary Care Provider: SYSTEM, PCP Other Clinician: Referring Provider: Referral, Self Treating Provider/Extender: STONE III, [name] Weeks in Treatment: 3 Active  Problems ICD-10 Evaluated Encounter Code Description Active Date Today Diagnosis E11.622 Type 2 diabetes mellitus with other skin ulcer 11/12/2018 No Yes I89.0 Lymphedema, not elsewhere classified 11/12/2018 No Yes I87.2 Venous insufficiency (chronic) (peripheral) 11/12/2018 No Yes L97.822 Non-pressure chronic ulcer of other part of left lower leg with 11/12/2018 No Yes fat layer exposed [name] (primary) hypertension 11/12/2018 No Yes Inactive Problems Resolved Problems Electronic Signature(s) Signed: 12/03/2018 9:13:58 AM By: Worthy Keeler PA-C Entered By: Worthy Keeler on 12/03/2018 09:13:57 [name] ([id number]) -------------------------------------------------------------------------------- Progress Note Details Patient Name: [name] Date of Service: 12/03/2018 9:00 AM Medical Record Number: [id number] Patient Account Number: [id number] Date of Birth/Sex: 06-09-1950 (68 y.o. M) Treating RN: Harold Barban Primary Care Provider: SYSTEM, PCP Other Clinician: Referring Provider: Referral, Self Treating Provider/Extender: STONE III, [name] Weeks in Treatment: 3 Subjective Chief Complaint Information obtained from Patient Left LE ulcers History of Present Illness (HPI) 68 year old male who presented to the ER with bilateral lower extremity blisters which had started last week. he has a past medical history of leukemia, diabetes mellitus, hypertension, edema of both lower extremities, his recurrent skin infections, peripheral vascular disease, coronary artery disease, congestive heart failure and peripheral neuropathy. in the ER he was given Rocephin and put on Silvadene cream. he was put on oral doxycycline and was asked to follow-up  with the [facility]. His last hemoglobin A1c was 6.6 in December and he checks his blood sugar once a week. He does not have any physicians outside the [place] system. He does not recall any vascular duplex studies done either for arterial or venous disease but was told to wear compression stockings which he does not use 05/30/2016 -- we have not yet received any of his notes from the [facility] hospital system and his arterial and venous duplex studies are scheduled here in [place] around mid February. We are unable to have his insurance accepted by home health agencies and hence he is getting dressings only once a week. 06/06/16 -- -- I received a call from the patient's PCP at the [facility] at [facility] and spoke to Dr. Garvin Fila, phone number [phone] and fax number [phone]. She confirmed that no vascular testing was done over the last 5 years and she would be happy to do them if the patient did want them to be done at the [place] and we could fax him a request. Readmission: 68 year old male seen by as in February of this year and was referred to vein and vascular for studies and opinion from the vascular surgeons. The patient returns today with a fresh problem having had blisters on his left lower extremity which have been there for about 5 days and he clearly states that he has been wearing his compression stockings as advised though he could not read the moderate compression and has been wearing light compression. Review of his electronic medical records note that he had lower extremity arterial duplex examination done on 06/23/2016 which showed no hemodynamically significant stenosis in the bilateral lower extremity arterial system. He also had a lower extremity venous reflux examination done on 07/07/2016 and it was noted that he had venous incompetence in the right great saphenous vein and bilateral common femoral veins. Patient was seen by Dr. Tamala Julian on the same day and for some reason his notes  do not reflect the venous studies or the arterial studies and he recommended patient do a venous duplex ultrasound to look for reflux and return to see him.he would also consider a  lymph pump if required. The patient was told that his workup was normal and hence the patient canceled his follow-up appointment. 02/03/17 on evaluation today patient left medial lower extremity blister appears to be doing about the same. It is still continuing to drain and there's still the blistered skin covering the wound bed which is making it difficult for the alternate to do its job. Fortunately there is no evidence of cellulitis. No fevers chills noted. Patient states in general he is not having any significant discomfort. Patient's lower extremity arterial duplex exam revealed that patient was hemodynamically stable with no evidence of stenosis in regard to the bilateral lower extremities. The lower extremity venous reflux exam revealed the patient had venous incontinence noted in the right greater saphenous [place], [name] ([id number]) and bilateral common femoral vein. There is no evidence of deep or superficial vein thrombosis in the bilateral lower extremities. Readmission: 11/12/18 Patient presents for evaluation our clinic today concerning issues that he is having with his left lower extremity. He tells me that a couple weeks ago he began developing blisters on the left lower extremity along with increased swelling. He typically wears his compression stockings on a regular basis is previously been evaluated both here as well is with vascular surgery they would recommend lymphedema pumps but unfortunately that somehow fell through and he never heard anything back from that. Nonetheless I think lymphedema pumps would be beneficial for this patient. He does have a history of hypertension and diabetes. Obviously the chronic venous stasis and lymphedema as well. At this point the blisters have been given in more  trouble he states sometimes when the blisters openings able to clean it down with alcohol and it will dry out and do well. Unfortunately that has not been the case this time. He is having some discomfort although this mean these with cleaning the areas he doesn't have discomfort just on a regular basis. He has not been able to wear his compression stockings since the blisters arose due to the fact that of course it will drain into the socks causing additional issues and he didn't have any way to wrap this otherwise. He has increased to taking his Lasix every day instead of every other day. He sees his primary care provider later this month as well. No fevers, chills, nausea, or vomiting noted at this time. 11/19/18-Patient returns at 1 week, per intake RN the amount of seepage into the compression wraps was definitely improved, overall all the wounds are measuring smaller but continuing silver alginate to the wounds as primary dressing 11/26/18 on evaluation today patient appears to be doing quite well in regard to his left lower Trinity ulcers. In fact of the areas that were noted initially he only has two regions still open. There is no evidence of active infection at this time. He still is not heard anything from the company regarding lymphedema pumps as of yet. Again as previously seen vascular they have not recommended any surgical intervention. 12/03/2018 on evaluation today patient actually appears to be doing quite well with regard to his lower extremity ulcers. In fact most of the areas appear to be healed the one spot which does not seem to be completely healed I am unsure of whether or not this is really draining that much but nonetheless there does not appear to be any signs of infection or significant drainage at this point. There is no sign of fever, chills, nausea, vomiting, or diarrhea. Overall I am pleased with how  things have progressed I think is very close to being able to transition  to his home compression stockings. Patient History Information obtained from Patient. Family History Cancer - Mother, Stroke - Father, No family history of Diabetes, Heart Disease, Hereditary Spherocytosis, Hypertension, Kidney Disease, Lung Disease, Seizures, Thyroid Problems, Tuberculosis. Social History Former smoker - quit 5 years ago, Marital Status - Divorced, Alcohol Use - Never, Drug Use - No History, Caffeine Use - Daily. Medical History Cardiovascular Patient has history of Hypertension, Peripheral Venous Disease Denies history of Angina, Arrhythmia, Congestive Heart Failure, Coronary Artery Disease, Deep Vein Thrombosis, Hypotension, Myocardial Infarction, Peripheral Arterial Disease, Phlebitis, Vasculitis Endocrine Patient has history of Type II Diabetes Integumentary (Skin) Denies history of History of Burn, History of pressure wounds Neurologic Patient has history of Neuropathy Denies history of Dementia, Quadriplegia, Paraplegia, Seizure Disorder Oncologic Patient has history of Received Chemotherapy Denies history of Received Radiation [name], [name] ([id number]) Medical And Surgical History Notes 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 Respiratory Denies complaints or symptoms of Chronic or frequent coughs, Shortness of Breath. Cardiovascular Complains or has symptoms of LE edema. Denies complaints or symptoms of Chest pain. Psychiatric Denies complaints or symptoms of Anxiety, Claustrophobia. Objective Constitutional Well-nourished and well-hydrated in no acute distress. Vitals Time Taken: 9:01 AM, Height: 73 in, Weight: 300 lbs, BMI: 39.6, Temperature: 98.2 F, Pulse: 50 bpm, Respiratory Rate: 18 breaths/min, Blood Pressure: 144/67 mmHg. Respiratory normal breathing without difficulty. clear to auscultation  bilaterally. Cardiovascular regular rate and rhythm with normal S1, S2. Psychiatric this patient is able to make decisions and demonstrates good insight into disease process. Alert and Oriented x 3. pleasant and cooperative. General Notes: Patient's wound bed currently did not require any sharp debridement and effect appears to be for the most part heal. I think that his swelling is much better controlled and I do believe that he is close to complete resolution possibly even next week. Integumentary (Hair, Skin) Wound #6 status is Open. Original cause of wound was Blister. The wound is located on the Left,Lateral Lower Leg. The wound measures 0.5cm length x 0.7cm width x 0.1cm depth; 0.275cm^2 area and 0.027cm^3 volume. There is Fat Layer (Subcutaneous Tissue) Exposed exposed. There is no tunneling or undermining noted. There is a large amount of serous drainage noted. The wound margin is flat and intact. There is large (67-100%) pink granulation within the wound bed. There is no necrotic tissue within the wound bed. Wound #7 status is Open. Original cause of wound was Blister. The wound is located on the Left,Distal,Lateral Lower Leg. The wound measures 0.1cm length x 0.1cm width x 0.1cm depth; 0.008cm^2 area and 0.001cm^3 volume. The wound is limited to skin breakdown. There is no tunneling or undermining noted. There is a none present amount of drainage noted. The wound margin is flat and intact. There is no granulation within the wound bed. There is no necrotic tissue within the [place], [name] ([id number]) wound bed. Assessment Active Problems ICD-10 Type 2 diabetes mellitus with other skin ulcer Lymphedema, not elsewhere classified Venous insufficiency (chronic) (peripheral) Non-pressure chronic ulcer of other part of left lower leg with fat layer exposed Essential (primary) hypertension Plan Wound Cleansing: May shower with protection. - Can cover with bag and tape tightly so not  to leak or purchase a cast protector to keep wrap dry Primary Wound Dressing: Wound #6 Left,Lateral Lower Leg: Silver  Alginate Wound #7 Left,Distal,Lateral Lower Leg: Silver Alginate Secondary Dressing: Wound #6 Left,Lateral Lower Leg: ABD pad Wound #7 Left,Distal,Lateral Lower Leg: ABD pad Dressing Change Frequency: Wound #6 Left,Lateral Lower Leg: Change dressing every week Wound #7 Left,Distal,Lateral Lower Leg: Change dressing every week Follow-up Appointments: Wound #6 Left,Lateral Lower Leg: Return Appointment in 1 week. Wound #7 Left,Distal,Lateral Lower Leg: Return Appointment in 1 week. Edema Control: Wound #6 Left,Lateral Lower Leg: 3 Layer Compression System - Left Lower Extremity Unna Boot to Left Lower Extremity - Unna only at top to anchor Wound #7 Left,Distal,Lateral Lower Leg: 3 Layer Compression System - Left Lower Extremity Unna Boot to Left Lower Extremity - Unna only at top to anchor Off-Loading: Wound #6 Left,Lateral Lower Leg: Other: - Elevate leg while sitting, to aid in the reduction of swelling. Wound #7 Left,Distal,Lateral Lower Leg: Other: - Elevate leg while sitting, to aid in the reduction of swelling. [name], [name] ([id number]) My suggestion at this point is gonna be that we go ahead and initiate the above wound care measures for the next week. That is gonna include a continuation of the silver out and it dressing to keep things nice and dry. With that being said we will also keep with the compression wrap at this point for the next week. He will plan to bring his compression stocking with him next week. With regard to the lymphedema pumps we are gonna check on where things stand in this regard. Obviously I do believe that this is something that will still be beneficial for him and we have filled out several items of paperwork to get this moving. Nonetheless we haven't gotten the word back from the insurance as to whether or not this is gonna be  covered in winter if he would receive this. We will check on this and follow up with him. Please see above for specific wound care orders. We will see patient for re-evaluation in 1 week(s) here in the clinic. If anything worsens or changes patient will contact our office for additional recommendations. Electronic Signature(s) Signed: 12/05/2018 9:47:59 AM By: Worthy Keeler PA-C Entered By: Worthy Keeler on 12/03/2018 20:37:55 [name] ([id number]) -------------------------------------------------------------------------------- ROS/PFSH Details Patient Name: [name] Date of Service: 12/03/2018 9:00 AM Medical Record Number: [id number] Patient Account Number: [id number] Date of Birth/Sex: [birth year] (68 y.o. M) Treating RN: Harold Barban Primary Care Provider: SYSTEM, PCP Other Clinician: Referring Provider: Referral, Self Treating Provider/Extender: STONE III, [name] Weeks in Treatment: 3 Information Obtained From Patient Constitutional Symptoms (General Health) Complaints and Symptoms: Negative for: Fatigue; Fever; Chills; Marked Weight Change Respiratory Complaints and Symptoms: Negative for: Chronic or frequent coughs; Shortness of Breath Cardiovascular Complaints and Symptoms: Positive for: LE edema Negative for: Chest pain Medical History: Positive for: Hypertension; Peripheral Venous Disease Negative for: Angina; Arrhythmia; Congestive Heart Failure; Coronary Artery Disease; Deep Vein Thrombosis; Hypotension; Myocardial Infarction; Peripheral Arterial Disease; Phlebitis; Vasculitis Psychiatric Complaints and Symptoms: Negative for: Anxiety; Claustrophobia Endocrine Medical History: Positive for: Type II Diabetes Treated with: Oral agents Integumentary (Skin) Medical History: Negative for: History of Burn; History of pressure wounds Neurologic Medical History: Positive for: Neuropathy Negative for: Dementia; Quadriplegia; Paraplegia; Seizure  Disorder Oncologic Medical History: Positive for: Received Chemotherapy [name], [name] ([id number]) Negative for: Received Radiation Past Medical History Notes: 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 Immunizations Pneumococcal Vaccine: Received Pneumococcal Vaccination: Yes Immunization Notes: up to date Implantable Devices None Family and Social History Cancer: Yes - Mother; Diabetes: No; Heart Disease: No; Hereditary Spherocytosis: No;  Hypertension: No; Kidney Disease: No; Lung Disease: No; Seizures: No; Stroke: Yes - Father; Thyroid Problems: No; Tuberculosis: No; Former smoker - quit 5 years ago; Marital Status - Divorced; Alcohol Use: Never; Drug Use: No History; Caffeine Use: Daily; Financial Concerns: No; Food, Clothing or Shelter Needs: No; Support System Lacking: No; Transportation Concerns: No Physician Affirmation I have reviewed and agree with the above information. Electronic Signature(s) Signed: 12/04/2018 4:21:20 PM By: Harold Barban Signed: 12/05/2018 9:47:59 AM By: Worthy Keeler PA-C Entered By: Worthy Keeler on 12/03/2018 20:36:27 [name] ([id number]) -------------------------------------------------------------------------------- SuperBill Details Patient Name: [name] Date of Service: 12/03/2018 Medical Record Number: [id number] Patient Account Number: [id number] Date of Birth/Sex: 10/18/1950 (68 y.o. M) Treating RN: Harold Barban Primary Care Provider: SYSTEM, PCP Other Clinician: Referring Provider: Referral, Self Treating Provider/Extender: STONE III, [name] Weeks in Treatment: 3 Diagnosis Coding ICD-10 Codes Code Description E11.622 Type 2 diabetes mellitus with other skin ulcer I89.0 Lymphedema, not elsewhere classified I87.2 Venous insufficiency (chronic) (peripheral) L97.822 Non-pressure chronic ulcer of other part of left lower leg with fat layer exposed I10 Essential (primary) hypertension Facility Procedures CPT4 Code:  28206015 Description: 99213 - WOUND CARE VISIT-LEV 3 EST PT Modifier: Quantity: 1 Physician Procedures CPT4 Code Description: 6153794 99214 - WC PHYS LEVEL 4 - EST PT ICD-10 Diagnosis Description E11.622 Type 2 diabetes mellitus with other skin ulcer I89.0 Lymphedema, not elsewhere classified I87.2 Venous insufficiency (chronic) (peripheral) L97.822  Non-pressure chronic ulcer of other part of left lower leg wit Modifier: h fat layer expos Quantity: 1 ed Electronic Signature(s) Signed: 12/05/2018 9:47:59 AM By: Worthy Keeler PA-C Entered By: Worthy Keeler on 12/03/2018 20:38:08

## 2018-12-04 NOTE — Progress Notes (Signed)
MURRELL, DOME ([id number]) Visit Report for 12/03/2018 Arrival Information Details Patient Name: [name], [name] Date of Service: 12/03/2018 9:00 AM Medical Record Number: [id number] Patient Account Number: [id number] Date of Birth/Sex: [birth year] (68 y.o. M) Treating RN: Harold Barban Primary Care Shynia Daleo: SYSTEM, PCP Other Clinician: Referring Bernadetta Roell: Referral, Self Treating Priyal Musquiz/Extender: STONE III, [name] Weeks in Treatment: 3 Visit Information History Since Last Visit Added or deleted any medications: No Patient Arrived: Ambulatory Any new allergies or adverse reactions: No Arrival Time: 09:00 Had a fall or experienced change in No Accompanied By: self activities of daily living that may affect Transfer Assistance: None risk of falls: Patient Identification Verified: Yes Signs or symptoms of abuse/neglect since last visito No Secondary Verification Process Completed: Yes Hospitalized since last visit: No Patient Has Alerts: Yes Implantable device outside of the clinic excluding No Patient Alerts: DMII cellular tissue based products placed in the center since last visit: Has Dressing in Place as Prescribed: Yes Has Compression in Place as Prescribed: Yes Pain Present Now: No Electronic Signature(s) Signed: 12/03/2018 11:52:04 AM By: Lorine Bears RCP, RRT, CHT Entered By: Lorine Bears on 12/03/2018 09:01:33 [name] ([id number]) -------------------------------------------------------------------------------- Clinic Level of Care Assessment Details Patient Name: [name] Date of Service: 12/03/2018 9:00 AM Medical Record Number: [id number] Patient Account Number: [id number] Date of Birth/Sex: Jan 07, 1951 (68 y.o. M) Treating RN: Harold Barban Primary Care Tahir Blank: SYSTEM, PCP Other Clinician: Referring Samira Acero: Referral, Self Treating Friedrich [name]/Extender: STONE III, [name] Weeks in Treatment: 3 Clinic Level of Care Assessment  Items TOOL 4 Quantity Score []  - Use when only an EandM is performed on FOLLOW-UP visit 0 ASSESSMENTS - Nursing Assessment / Reassessment X - Reassessment of Co-morbidities (includes updates in patient status) 1 10 X- 1 5 Reassessment of Adherence to Treatment Plan ASSESSMENTS - Wound and Skin Assessment / Reassessment X - Simple Wound Assessment / Reassessment - one wound 1 5 []  - 0 Complex Wound Assessment / Reassessment - multiple wounds []  - 0 Dermatologic / Skin Assessment (not related to wound area) ASSESSMENTS - Focused Assessment []  - Circumferential Edema Measurements - multi extremities 0 []  - 0 Nutritional Assessment / Counseling / Intervention []  - 0 Lower Extremity Assessment (monofilament, tuning fork, pulses) []  - 0 Peripheral Arterial Disease Assessment (using hand held doppler) ASSESSMENTS - Ostomy and/or Continence Assessment and Care []  - Incontinence Assessment and Management 0 []  - 0 Ostomy Care Assessment and Management (repouching, etc.) PROCESS - Coordination of Care X - Simple Patient / Family Education for ongoing care 1 15 []  - 0 Complex (extensive) Patient / Family Education for ongoing care X- 1 10 Staff obtains Programmer, systems, Records, Test Results / Process Orders []  - 0 Staff telephones HHA, Nursing Homes / Clarify orders / etc []  - 0 Routine Transfer to another Facility (non-emergent condition) []  - 0 Routine Hospital Admission (non-emergent condition) []  - 0 New Admissions / Biomedical engineer / Ordering NPWT, Apligraf, etc. []  - 0 Emergency Hospital Admission (emergent condition) X- 1 10 Simple Discharge Coordination [place], AVYUKT ([id number]) []  - 0 Complex (extensive) Discharge Coordination PROCESS - Special Needs []  - Pediatric / Minor Patient Management 0 []  - 0 Isolation Patient Management []  - 0 Hearing / Language / Visual special needs []  - 0 Assessment of Community assistance (transportation, D/C planning, etc.) []  -  0 Additional assistance / Altered mentation []  - 0 Support Surface(s) Assessment (bed, cushion, seat, etc.) INTERVENTIONS - Wound Cleansing / Measurement X - Simple Wound Cleansing - one wound 1 5 []  -  0 Complex Wound Cleansing - multiple wounds X- 1 5 Wound Imaging (photographs - any number of wounds) []  - 0 Wound Tracing (instead of photographs) X- 1 5 Simple Wound Measurement - one wound []  - 0 Complex Wound Measurement - multiple wounds INTERVENTIONS - Wound Dressings X - Small Wound Dressing one or multiple wounds 1 10 []  - 0 Medium Wound Dressing one or multiple wounds []  - 0 Large Wound Dressing one or multiple wounds []  - 0 Application of Medications - topical []  - 0 Application of Medications - injection INTERVENTIONS - Miscellaneous []  - External ear exam 0 []  - 0 Specimen Collection (cultures, biopsies, blood, body fluids, etc.) []  - 0 Specimen(s) / Culture(s) sent or taken to Lab for analysis []  - 0 Patient Transfer (multiple staff / Civil Service fast streamer / Similar devices) []  - 0 Simple Staple / Suture removal (25 or less) []  - 0 Complex Staple / Suture removal (26 or more) []  - 0 Hypo / Hyperglycemic Management (close monitor of Blood Glucose) []  - 0 Ankle / Brachial Index (ABI) - do not check if billed separately X- 1 5 Vital Signs CASIMER, RUSSETT ([id number]) Has the patient been seen at the hospital within the last three years: Yes Total Score: 85 Level Of Care: New/Established - Level 3 Electronic Signature(s) Signed: 12/04/2018 4:21:20 PM By: Harold Barban Entered By: Harold Barban on 12/03/2018 09:21:58 [name] ([id number]) -------------------------------------------------------------------------------- Encounter Discharge Information Details Patient Name: [name] Date of Service: 12/03/2018 9:00 AM Medical Record Number: [id number] Patient Account Number: [id number] Date of Birth/Sex: 08/29/1950 (68 y.o. M) Treating RN: Army Melia Primary  Care Felicita Nuncio: SYSTEM, PCP Other Clinician: Referring Avalin Briley: Referral, Self Treating Biagio Snelson/Extender: STONE III, [name] Weeks in Treatment: 3 Encounter Discharge Information Items Discharge Condition: Stable Ambulatory Status: Ambulatory Discharge Destination: Home Transportation: Private Auto Accompanied By: self Schedule Follow-up Appointment: Yes Clinical Summary of Care: Electronic Signature(s) Signed: 12/03/2018 11:41:51 AM By: Army Melia Entered By: Army Melia on 12/03/2018 09:35:14 [name] ([id number]) -------------------------------------------------------------------------------- Lower Extremity Assessment Details Patient Name: [name] Date of Service: 12/03/2018 9:00 AM Medical Record Number: [id number] Patient Account Number: [id number] Date of Birth/Sex: Feb 07, 1951 (68 y.o. M) Treating RN: Montey Hora Primary Care Dulse Rutan: SYSTEM, PCP Other Clinician: Referring Clarabel Marion: Referral, Self Treating Brach Birdsall/Extender: STONE III, [name] Weeks in Treatment: 3 Edema Assessment Assessed: [Left: No] [Right: No] Edema: [Left: Ye] [Right: s] Calf Left: Right: Point of Measurement: 30 cm From Medial Instep 39.5 cm cm Ankle Left: Right: Point of Measurement: 11 cm From Medial Instep 31.5 cm cm Vascular Assessment Pulses: Dorsalis Pedis Palpable: [Left:Yes] Electronic Signature(s) Signed: 12/03/2018 4:17:28 PM By: Montey Hora Entered By: Montey Hora on 12/03/2018 09:05:56 [name] ([id number]) -------------------------------------------------------------------------------- Multi Wound Chart Details Patient Name: [name] Date of Service: 12/03/2018 9:00 AM Medical Record Number: [id number] Patient Account Number: [id number] Date of Birth/Sex: 10-20-1950 (68 y.o. M) Treating RN: Harold Barban Primary Care Kyarra Vancamp: SYSTEM, PCP Other Clinician: Referring Tierra Thoma: Referral, Self Treating Toni Hoffmeister/Extender: STONE III, [name] Weeks in  Treatment: 3 Vital Signs Height(in): 73 Pulse(bpm): 50 Weight(lbs): 300 Blood Pressure(mmHg): 144/67 Body Mass Index(BMI): 40 Temperature(F): 98.2 Respiratory Rate 18 (breaths/min): Photos: [N/A:N/A] Wound Location: Left Lower Leg - Lateral Left Lower Leg - Lateral, Distal N/A Wounding Event: Blister Blister N/A Primary Etiology: Diabetic Wound/Ulcer of the Diabetic Wound/Ulcer of the N/A Lower Extremity Lower Extremity Secondary Etiology: Venous Leg Ulcer Venous Leg Ulcer N/A Comorbid History: Hypertension, Peripheral Hypertension, Peripheral N/A Venous Disease, Type II Venous Disease, Type II Diabetes, Neuropathy,  Diabetes, Neuropathy, Received Chemotherapy Received Chemotherapy Date Acquired: 10/29/2018 10/29/2018 N/A Weeks of Treatment: 3 3 N/A Wound Status: Open Open N/A Clustered Wound: Yes No N/A Clustered Quantity: 2 N/A N/A Measurements L x W x D 0.5x0.7x0.1 0.1x0.1x0.1 N/A (cm) Area (cm) : 0.275 0.008 N/A Volume (cm) : 0.027 0.001 N/A % Reduction in Area: 98.40% 100.00% N/A % Reduction in Volume: 98.50% 100.00% N/A Classification: Grade 1 Grade 1 N/A Exudate Amount: Large None Present N/A Exudate Type: Serous N/A N/A Exudate Color: amber N/A N/A Wound Margin: Flat and Intact Flat and Intact N/A Granulation Amount: Large (67-100%) None Present (0%) N/A Granulation Quality: Pink N/A N/A Necrotic Amount: None Present (0%) None Present (0%) N/A Exposed Structures: N/A KONNOR, VONDRASEK ([id number]) Fat Layer (Subcutaneous Fascia: No Tissue) Exposed: Yes Fat Layer (Subcutaneous Fascia: No Tissue) Exposed: No Tendon: No Tendon: No Muscle: No Muscle: No Joint: No Joint: No Bone: No Bone: No Limited to Skin Breakdown Epithelialization: Medium (34-66%) Large (67-100%) N/A Treatment Notes Electronic Signature(s) Signed: 12/04/2018 4:21:20 PM By: Harold Barban Entered By: Harold Barban on 12/03/2018 [place] [name]  ([id number]) -------------------------------------------------------------------------------- Multi-Disciplinary Care Plan Details Patient Name: [name] Date of Service: 12/03/2018 9:00 AM Medical Record Number: [id number] Patient Account Number: [id number] Date of Birth/Sex: Mar 18, 1951 (68 y.o. M) Treating RN: Harold Barban Primary Care Tawny Raspberry: SYSTEM, PCP Other Clinician: Referring Colman Birdwell: Referral, Self Treating Murray Guzzetta/Extender: STONE III, [name] Weeks in Treatment: 3 Active Inactive Venous Leg Ulcer Nursing Diagnoses: Actual venous Insuffiency (use after diagnosis is confirmed) Knowledge deficit related to disease process and management Goals: Patient will maintain optimal edema control Date Initiated: 11/12/2018 Target Resolution Date: 12/13/2018 Goal Status: Active Patient/caregiver will verbalize understanding of disease process and disease management Date Initiated: 11/12/2018 Target Resolution Date: 12/13/2018 Goal Status: Active Interventions: Assess peripheral edema status every visit. Compression as ordered Provide education on venous insufficiency Notes: Wound/Skin Impairment Nursing Diagnoses: Impaired tissue integrity Knowledge deficit related to ulceration/compromised skin integrity Goals: Ulcer/skin breakdown will have a volume reduction of 30% by week 4 Date Initiated: 11/12/2018 Target Resolution Date: 12/13/2018 Goal Status: Active Interventions: Assess patient/caregiver ability to obtain necessary supplies Assess patient/caregiver ability to perform ulcer/skin care regimen upon admission and as needed Assess ulceration(s) every visit Provide education on ulcer and skin care Notes: ASPEN, DETERDING ([id number]) Electronic Signature(s) Signed: 12/04/2018 4:21:20 PM By: Harold Barban Entered By: Harold Barban on 12/03/2018 09:18:26 [name] ([id number]) -------------------------------------------------------------------------------- Pain Assessment  Details Patient Name: [name] Date of Service: 12/03/2018 9:00 AM Medical Record Number: [id number] Patient Account Number: [id number] Date of Birth/Sex: Jun 14, 1950 (68 y.o. M) Treating RN: Harold Barban Primary Care Sharone Almond: SYSTEM, PCP Other Clinician: Referring Psalms Olarte: Referral, Self Treating Alahna Dunne/Extender: STONE III, [name] Weeks in Treatment: 3 Active Problems Location of Pain Severity and Description of Pain Patient Has Paino No Site Locations Pain Management and Medication Current Pain Management: Electronic Signature(s) Signed: 12/03/2018 11:52:04 AM By: Lorine Bears RCP, RRT, CHT Signed: 12/04/2018 4:21:20 PM By: Harold Barban Entered By: Lorine Bears on 12/03/2018 09:01:40 [name] ([id number]) -------------------------------------------------------------------------------- Patient/Caregiver Education Details Patient Name: [name] Date of Service: 12/03/2018 9:00 AM Medical Record Number: [id number] Patient Account Number: [id number] Date of Birth/Gender: April 10, 1951 (68 y.o. M) Treating RN: Harold Barban Primary Care Physician: SYSTEM, PCP Other Clinician: Referring Physician: Referral, Self Treating Physician/Extender: Melburn Hake, [name] Weeks in Treatment: 3 Education Assessment Education Provided To: Patient Education Topics Provided Wound/Skin Impairment: Handouts: Caring for Your Ulcer Methods: Demonstration, Explain/Verbal Responses: State content correctly Electronic Signature(s) Signed:  12/04/2018 4:21:20 PM By: Harold Barban Entered By: Harold Barban on 12/03/2018 09:19:31 [name] ([id number]) -------------------------------------------------------------------------------- Wound Assessment Details Patient Name: [name] Date of Service: 12/03/2018 9:00 AM Medical Record Number: [id number] Patient Account Number: [id number] Date of Birth/Sex: Aug 27, 1950 (68 y.o. M) Treating RN: Montey Hora Primary Care Taegan Standage: SYSTEM, PCP Other Clinician: Referring Taisia Fantini: Referral, Self Treating Breanah Faddis/Extender: STONE III, [name] Weeks in Treatment: 3 Wound Status Wound Number: 6 Primary Diabetic Wound/Ulcer of the Lower Extremity Etiology: Wound Location: Left Lower Leg - Lateral Secondary Venous Leg Ulcer Wounding Event: Blister Etiology: Date Acquired: 10/29/2018 Wound Open Weeks Of Treatment: 3 Status: Clustered Wound: Yes Comorbid Hypertension, Peripheral Venous Disease, History: Type II Diabetes, Neuropathy, Received Chemotherapy Photos Wound Measurements Length: (cm) 0.5 % Reducti Width: (cm) 0.7 % Reducti Depth: (cm) 0.1 Epithelia Clustered Quantity: 2 Tunneling Area: (cm) 0.275 Undermin Volume: (cm) 0.027 on in Area: 98.4% on in Volume: 98.5% lization: Medium (34-66%) : No ing: No Wound Description Classification: Grade 1 Foul Odor Wound Margin: Flat and Intact Slough/Fi Exudate Amount: Large Exudate Type: Serous Exudate Color: amber After Cleansing: No brino Yes Wound Bed Granulation Amount: Large (67-100%) Exposed Structure Granulation Quality: Pink Fascia Exposed: No Necrotic Amount: None Present (0%) Fat Layer (Subcutaneous Tissue) Exposed: Yes Tendon Exposed: No Muscle Exposed: No Joint Exposed: No Bone Exposed: No Gracie, [name] ([id number]) Treatment Notes Wound #6 (Left, Lateral Lower Leg) Notes silver ag, ABD, 3 layer Electronic Signature(s) Signed: 12/03/2018 4:17:28 PM By: Montey Hora Entered By: Montey Hora on 12/03/2018 09:10:51 [name] ([id number]) -------------------------------------------------------------------------------- Wound Assessment Details Patient Name: [name] Date of Service: 12/03/2018 9:00 AM Medical Record Number: [id number] Patient Account Number: [id number] Date of Birth/Sex: [birth year] (68 y.o. M) Treating RN: Montey Hora Primary Care Tully Burgo: SYSTEM, PCP Other  Clinician: Referring Roger Fasnacht: Referral, Self Treating Shaquella Stamant/Extender: STONE III, [name] Weeks in Treatment: 3 Wound Status Wound Number: 7 Primary Diabetic Wound/Ulcer of the Lower Extremity Etiology: Wound Location: Left Lower Leg - Lateral, Distal Secondary Venous Leg Ulcer Wounding Event: Blister Etiology: Date Acquired: 10/29/2018 Wound Open Weeks Of Treatment: 3 Status: Clustered Wound: No Comorbid Hypertension, Peripheral Venous Disease, History: Type II Diabetes, Neuropathy, Received Chemotherapy Photos Wound Measurements Length: (cm) 0.1 Width: (cm) 0.1 Depth: (cm) 0.1 Area: (cm) 0.008 Volume: (cm) 0.001 % Reduction in Area: 100% % Reduction in Volume: 100% Epithelialization: Large (67-100%) Tunneling: No Undermining: No Wound Description Classification: Grade 1 Foul Odo Wound Margin: Flat and Intact Slough/F Exudate Amount: None Present r After Cleansing: No ibrino No Wound Bed Granulation Amount: None Present (0%) Exposed Structure Necrotic Amount: None Present (0%) Fascia Exposed: No Fat Layer (Subcutaneous Tissue) Exposed: No Tendon Exposed: No Muscle Exposed: No Joint Exposed: No Bone Exposed: No Limited to Skin Jock Mahon, [name] ([id number]) Treatment Notes Wound #7 (Left, Distal, Lateral Lower Leg) Notes silver ag, ABD, 3 layer Electronic Signature(s) Signed: 12/03/2018 4:17:28 PM By: Montey Hora Entered By: Montey Hora on 12/03/2018 09:11:48 [name] ([id number]) -------------------------------------------------------------------------------- Central Details Patient Name: [name] Date of Service: 12/03/2018 9:00 AM Medical Record Number: [id number] Patient Account Number: [id number] Date of Birth/Sex: 29-Sep-1950 (68 y.o. M) Treating RN: Harold Barban Primary Care Tomasz Steeves: SYSTEM, PCP Other Clinician: Referring Korvin Valentine: Referral, Self Treating Joeziah Voit/Extender: STONE III, [name] Weeks in Treatment: 3 Vital  Signs Time Taken: 09:01 Temperature (F): 98.2 Height (in): 73 Pulse (bpm): 50 Weight (lbs): 300 Respiratory Rate (breaths/min): 18 Body Mass Index (BMI): 39.6 Blood Pressure (mmHg): 144/67 Reference Range: 80 - 120 mg / dl Electronic  Signature(s) Signed: 12/03/2018 11:52:04 AM By: Lorine Bears RCP, RRT, CHT Entered By: Lorine Bears on 12/03/2018 09:05:22

## 2018-12-10 ENCOUNTER — Encounter: Payer: PPO | Attending: Physician Assistant | Admitting: Physician Assistant

## 2018-12-10 ENCOUNTER — Other Ambulatory Visit: Payer: Self-pay | Admitting: Physician Assistant

## 2018-12-10 ENCOUNTER — Other Ambulatory Visit: Payer: Self-pay

## 2018-12-10 DIAGNOSIS — I509 Heart failure, unspecified: Secondary | ICD-10-CM | POA: Insufficient documentation

## 2018-12-10 DIAGNOSIS — E1142 Type 2 diabetes mellitus with diabetic polyneuropathy: Secondary | ICD-10-CM | POA: Insufficient documentation

## 2018-12-10 DIAGNOSIS — E11622 Type 2 diabetes mellitus with other skin ulcer: Secondary | ICD-10-CM | POA: Insufficient documentation

## 2018-12-10 DIAGNOSIS — I890 Lymphedema, not elsewhere classified: Secondary | ICD-10-CM | POA: Diagnosis not present

## 2018-12-10 DIAGNOSIS — I252 Old myocardial infarction: Secondary | ICD-10-CM | POA: Insufficient documentation

## 2018-12-10 DIAGNOSIS — L97222 Non-pressure chronic ulcer of left calf with fat layer exposed: Secondary | ICD-10-CM | POA: Diagnosis not present

## 2018-12-10 DIAGNOSIS — I2510 Atherosclerotic heart disease of native coronary artery without angina pectoris: Secondary | ICD-10-CM | POA: Diagnosis not present

## 2018-12-10 DIAGNOSIS — I110 Hypertensive heart disease with heart failure: Secondary | ICD-10-CM | POA: Insufficient documentation

## 2018-12-10 DIAGNOSIS — E669 Obesity, unspecified: Secondary | ICD-10-CM | POA: Insufficient documentation

## 2018-12-10 DIAGNOSIS — Z809 Family history of malignant neoplasm, unspecified: Secondary | ICD-10-CM | POA: Insufficient documentation

## 2018-12-10 DIAGNOSIS — Z87891 Personal history of nicotine dependence: Secondary | ICD-10-CM | POA: Insufficient documentation

## 2018-12-10 DIAGNOSIS — L97822 Non-pressure chronic ulcer of other part of left lower leg with fat layer exposed: Secondary | ICD-10-CM | POA: Insufficient documentation

## 2018-12-10 DIAGNOSIS — Z856 Personal history of leukemia: Secondary | ICD-10-CM | POA: Diagnosis not present

## 2018-12-10 DIAGNOSIS — E1151 Type 2 diabetes mellitus with diabetic peripheral angiopathy without gangrene: Secondary | ICD-10-CM | POA: Insufficient documentation

## 2018-12-10 NOTE — Progress Notes (Addendum)
[name] ([id number]) Visit Report for 12/10/2018 Chief Complaint Document Details Patient Name: [name], [name] Date of Service: 12/10/2018 9:15 AM Medical Record Number: [id number] Patient Account Number: [id number] Date of Birth/Sex: 1950-05-16 (68 y.o. M) Treating RN: Harold Barban Primary Care Provider: SYSTEM, PCP Other Clinician: Referring Provider: Referral, Self Treating Provider/Extender: Melburn Hake, Dlynn Ranes Weeks in Treatment: 4 Information Obtained from: Patient Chief Complaint Left LE ulcers Electronic Signature(s) Signed: 12/10/2018 9:29:19 AM By: Worthy Keeler PA-C Entered By: Worthy Keeler on 12/10/2018 09:29:19 [name] ([id number]) -------------------------------------------------------------------------------- Debridement Details Patient Name: [name] Date of Service: 12/10/2018 9:15 AM Medical Record Number: [id number] Patient Account Number: [id number] Date of Birth/Sex: [birth year] (68 y.o. M) Treating RN: Harold Barban Primary Care Provider: SYSTEM, PCP Other Clinician: Referring Provider: Referral, Self Treating Provider/Extender: STONE III, Aylen Stradford Weeks in Treatment: 4 Debridement Performed for Wound #7 Left,Distal,Lateral Lower Leg Assessment: Performed By: Physician STONE III, Coraima Tibbs E., PA-C Debridement Type: Debridement Severity of Tissue Pre Fat layer exposed Debridement: Level of Consciousness (Pre- Awake and Alert procedure): Pre-procedure Verification/Time Yes - 09:34 Out Taken: Start Time: 09:34 Pain Control: Lidocaine Total Area Debrided (L x W): 0.1 (cm) x 0.1 (cm) = 0.01 (cm) Tissue and other material Viable, Non-Viable, Eschar, Slough, Subcutaneous, Slough debrided: Level: Skin/Subcutaneous Tissue Debridement Description: Excisional Instrument: Curette Bleeding: Minimum Hemostasis Achieved: Pressure End Time: 09:37 Procedural Pain: 0 Post Procedural Pain: 0 Response to Treatment: Procedure was tolerated well Level of  Consciousness Awake and Alert (Post-procedure): Post Debridement Measurements of Total Wound Length: (cm) 0.1 Width: (cm) 0.1 Depth: (cm) 0.1 Volume: (cm) 0.001 Character of Wound/Ulcer Post Debridement: Improved Severity of Tissue Post Debridement: Fat layer exposed Post Procedure Diagnosis Same as Pre-procedure Electronic Signature(s) Signed: 12/10/2018 4:11:30 PM By: Harold Barban Signed: 12/10/2018 6:32:30 PM By: Worthy Keeler PA-C Entered By: Harold Barban on 12/10/2018 09:37:09 [name] ([id number]) -------------------------------------------------------------------------------- HPI Details Patient Name: [name] Date of Service: 12/10/2018 9:15 AM Medical Record Number: [id number] Patient Account Number: [id number] Date of Birth/Sex: Jul 28, 1950 (68 y.o. M) Treating RN: Harold Barban Primary Care Provider: SYSTEM, PCP Other Clinician: Referring Provider: Referral, Self Treating Provider/Extender: STONE III, Shaquia Berkley Weeks in Treatment: 4 History of Present Illness HPI Description: 68 year old male who presented to the ER with bilateral lower extremity blisters which had started last week. he has a past medical history of leukemia, diabetes mellitus, hypertension, edema of both lower extremities, his recurrent skin infections, peripheral vascular disease, coronary artery disease, congestive heart failure and peripheral neuropathy. in the ER he was given Rocephin and put on Silvadene cream. he was put on oral doxycycline and was asked to follow-up with the [facility]. His last hemoglobin A1c was 6.6 in December and he checks his blood sugar once a week. He does not have any physicians outside the [place] system. He does not recall any vascular duplex studies done either for arterial or venous disease but was told to wear compression stockings which he does not use 05/30/2016 -- we have not yet received any of his notes from the [facility] hospital system and his arterial and venous  duplex studies are scheduled here in [place] around mid February. We are unable to have his insurance accepted by home health agencies and hence he is getting dressings only once a week. 06/06/16 -- -- I received a call from the patient's PCP at the [facility] at [facility] and spoke to Dr. Garvin Fila, phone number [phone] and fax number [phone]. She confirmed that no vascular testing was done  over the last 5 years and she would be happy to do them if the patient did want them to be done at the [place] and we could fax him a request. Readmission: 68 year old male seen by as in February of this year and was referred to vein and vascular for studies and opinion from the vascular surgeons. The patient returns today with a fresh problem having had blisters on his left lower extremity which have been there for about 5 days and he clearly states that he has been wearing his compression stockings as advised though he could not read the moderate compression and has been wearing light compression. Review of his electronic medical records note that he had lower extremity arterial duplex examination done on 06/23/2016 which showed no hemodynamically significant stenosis in the bilateral lower extremity arterial system. He also had a lower extremity venous reflux examination done on 07/07/2016 and it was noted that he had venous incompetence in the right great saphenous vein and bilateral common femoral veins. Patient was seen by Dr. Tamala Julian on the same day and for some reason his notes do not reflect the venous studies or the arterial studies and he recommended patient do a venous duplex ultrasound to look for reflux and return to see him.he would also consider a lymph pump if required. The patient was told that his workup was normal and hence the patient canceled his follow-up appointment. 02/03/17 on evaluation today patient left medial lower extremity blister appears to be doing about the same. It is still  continuing to drain and there's still the blistered skin covering the wound bed which is making it difficult for the alternate to do its job. Fortunately there is no evidence of cellulitis. No fevers chills noted. Patient states in general he is not having any significant discomfort. Patient's lower extremity arterial duplex exam revealed that patient was hemodynamically stable with no evidence of stenosis in regard to the bilateral lower extremities. The lower extremity venous reflux exam revealed the patient had venous incontinence noted in the right greater saphenous and bilateral common femoral vein. There is no evidence of deep or superficial vein thrombosis in the bilateral lower extremities. Readmission: JAHAAN, VANWAGNER ([id number]) 11/12/18 Patient presents for evaluation our clinic today concerning issues that he is having with his left lower extremity. He tells me that a couple weeks ago he began developing blisters on the left lower extremity along with increased swelling. He typically wears his compression stockings on a regular basis is previously been evaluated both here as well is with vascular surgery they would recommend lymphedema pumps but unfortunately that somehow fell through and he never heard anything back from that. Nonetheless I think lymphedema pumps would be beneficial for this patient. He does have a history of hypertension and diabetes. Obviously the chronic venous stasis and lymphedema as well. At this point the blisters have been given in more trouble he states sometimes when the blisters openings able to clean it down with alcohol and it will dry out and do well. Unfortunately that has not been the case this time. He is having some discomfort although this mean these with cleaning the areas he doesn't have discomfort just on a regular basis. He has not been able to wear his compression stockings since the blisters arose due to the fact that of course it will drain into  the socks causing additional issues and he didn't have any way to wrap this otherwise. He has increased to taking his Lasix every day  instead of every other day. He sees his primary care provider later this month as well. No fevers, chills, nausea, or vomiting noted at this time. 11/19/18-Patient returns at 1 week, per intake RN the amount of seepage into the compression wraps was definitely improved, overall all the wounds are measuring smaller but continuing silver alginate to the wounds as primary dressing 11/26/18 on evaluation today patient appears to be doing quite well in regard to his left lower Trinity ulcers. In fact of the areas that were noted initially he only has two regions still open. There is no evidence of active infection at this time. He still is not heard anything from the company regarding lymphedema pumps as of yet. Again as previously seen vascular they have not recommended any surgical intervention. 12/03/2018 on evaluation today patient actually appears to be doing quite well with regard to his lower extremity ulcers. In fact most of the areas appear to be healed the one spot which does not seem to be completely healed I am unsure of whether or not this is really draining that much but nonetheless there does not appear to be any signs of infection or significant drainage at this point. There is no sign of fever, chills, nausea, vomiting, or diarrhea. Overall I am pleased with how things have progressed I think is very close to being able to transition to his home compression stockings. 12/10/2018 upon evaluation today patient appears to be doing quite well with regard to his left lower extremity. He has been tolerating the dressing changes without complication. Fortunately there is no signs of active infection at this time. He appears after thorough evaluation of his leg to only have 1 small area that remains open at this point everything else appears to be almost completely  closed. He still have significant swelling of the left lower extremity. We had discussed discussing this with his primary care provider he is not able to see her in person they were at the [facility] and right now the [place] is not seeing patients on site. According to the patient anyway. Subsequently he did speak with her apparently and his primary care provider feels that he may likely have a DVT. With that being said she has not seen his leg she is just going off of his history. Nonetheless that is a concern that the patient now has as well and while I do not feel the DVT is likely we can definitely ensure that that is not the case I will go ahead and see about putting that order in today. Nonetheless otherwise I am in a recommend that we continue with the current wound care measures including the compression therapy most likely. We just need to ensure that his leg is indeed free of any DVTs. Electronic Signature(s) Signed: 12/10/2018 1:21:23 PM By: Worthy Keeler PA-C Entered By: Worthy Keeler on 12/10/2018 13:21:22 [name] ([id number]) -------------------------------------------------------------------------------- Physical Exam Details Patient Name: EDER, MACEK Date of Service: 12/10/2018 9:15 AM Medical Record Number: [id number] Patient Account Number: [id number] Date of Birth/Sex: 04-28-1951 (68 y.o. M) Treating RN: Harold Barban Primary Care Provider: SYSTEM, PCP Other Clinician: Referring Provider: Referral, Self Treating Provider/Extender: STONE III, Kyser Wandel Weeks in Treatment: 4 Constitutional Well-nourished and well-hydrated in no acute distress. Respiratory normal breathing without difficulty. clear to auscultation bilaterally. Cardiovascular regular rate and rhythm with normal S1, S2. Psychiatric this patient is able to make decisions and demonstrates good insight into disease process. Alert and Oriented x 3. pleasant and cooperative.  Notes Upon inspection today  patient's wound bed actually again showed some eschar covering the surface of the wound I did have to remove this once eschar was removed down to good subcutaneous tissue it revealed that he really did not have any significant opening there was just a very small area at this point this is very close to complete resolution. Overall very pleased in this regard. Electronic Signature(s) Signed: 12/10/2018 1:21:56 PM By: Worthy Keeler PA-C Entered By: Worthy Keeler on 12/10/2018 13:21:56 [name] ([id number]) -------------------------------------------------------------------------------- Physician Orders Details Patient Name: [name] Date of Service: 12/10/2018 9:15 AM Medical Record Number: [id number] Patient Account Number: [id number] Date of Birth/Sex: 1950-12-01 (68 y.o. M) Treating RN: Harold Barban Primary Care Provider: SYSTEM, PCP Other Clinician: Referring Provider: Referral, Self Treating Provider/Extender: STONE III, Reagan Klemz Weeks in Treatment: 4 Verbal / Phone Orders: No Diagnosis Coding ICD-10 Coding Code Description E11.622 Type 2 diabetes mellitus with other skin ulcer I89.0 Lymphedema, not elsewhere classified I87.2 Venous insufficiency (chronic) (peripheral) L97.822 Non-pressure chronic ulcer of other part of left lower leg with fat layer exposed I10 Essential (primary) hypertension Wound Cleansing o May shower with protection. - Can cover with bag and tape tightly so not to leak or purchase a cast protector to keep wrap dry Primary Wound Dressing Wound #6 Left,Lateral Lower Leg o Silver Alginate Wound #7 Left,Distal,Lateral Lower Leg o Silver Alginate Secondary Dressing Wound #6 Left,Lateral Lower Leg o ABD pad Wound #7 Left,Distal,Lateral Lower Leg o ABD pad Dressing Change Frequency Wound #6 Left,Lateral Lower Leg o Change dressing every week Wound #7 Left,Distal,Lateral Lower Leg o Change dressing every week Follow-up  Appointments Wound #6 Left,Lateral Lower Leg o Return Appointment in 1 week. - Nurse visit after Ultrasound Test Wound #7 Left,Distal,Lateral Lower Leg o Return Appointment in 1 week. - Nurse visit after Ultrasound Test Edema Control Wound #6 Left,Lateral Lower Leg JOESPH, MARCY ([id number]) o Other: - Kerlix and Tubi Grip G Wound #7 Left,Distal,Lateral Lower Leg o Other: - Ker;ix and Tubi Grip G Off-Loading Wound #6 Left,Lateral Lower Leg o Other: - Elevate leg while sitting, to aid in the reduction of swelling. Wound #7 Left,Distal,Lateral Lower Leg o Other: - Elevate leg while sitting, to aid in the reduction of swelling. Additional Orders / Instructions o Activity as tolerated Electronic Signature(s) Signed: 12/10/2018 4:11:30 PM By: Harold Barban Signed: 12/10/2018 6:32:30 PM By: Worthy Keeler PA-C Entered By: Harold Barban on 12/10/2018 15:09:11 [name] ([id number]) -------------------------------------------------------------------------------- Problem List Details Patient Name: [name] Date of Service: 12/10/2018 9:15 AM Medical Record Number: [id number] Patient Account Number: [id number] Date of Birth/Sex: 1950/11/18 (68 y.o. M) Treating RN: Harold Barban Primary Care Provider: SYSTEM, PCP Other Clinician: Referring Provider: Referral, Self Treating Provider/Extender: Melburn Hake, Lawyer Washabaugh Weeks in Treatment: 4 Active Problems ICD-10 Evaluated Encounter Code Description Active Date Today Diagnosis E11.622 Type 2 diabetes mellitus with other skin ulcer 11/12/2018 No Yes I89.0 Lymphedema, not elsewhere classified 11/12/2018 No Yes I87.2 Venous insufficiency (chronic) (peripheral) 11/12/2018 No Yes L97.822 Non-pressure chronic ulcer of other part of left lower leg with 11/12/2018 No Yes fat layer exposed I10 Essential (primary) hypertension 11/12/2018 No Yes Inactive Problems Resolved Problems Electronic Signature(s) Signed: 12/10/2018 9:29:11 AM By: Worthy Keeler PA-C Entered By: Worthy Keeler on 12/10/2018 09:29:10 [name] ([id number]) -------------------------------------------------------------------------------- Progress Note Details Patient Name: [name] Date of Service: 12/10/2018 9:15 AM Medical Record Number: [id number] Patient Account Number: [id number] Date of Birth/Sex: 1951/05/09 (68 y.o. M) Treating RN: Harold Barban  Primary Care Provider: SYSTEM, PCP Other Clinician: Referring Provider: Referral, Self Treating Provider/Extender: STONE III, Rieley Khalsa Weeks in Treatment: 4 Subjective Chief Complaint Information obtained from Patient Left LE ulcers History of Present Illness (HPI) 68 year old male who presented to the ER with bilateral lower extremity blisters which had started last week. he has a past medical history of leukemia, diabetes mellitus, hypertension, edema of both lower extremities, his recurrent skin infections, peripheral vascular disease, coronary artery disease, congestive heart failure and peripheral neuropathy. in the ER he was given Rocephin and put on Silvadene cream. he was put on oral doxycycline and was asked to follow-up with the [facility]. His last hemoglobin A1c was 6.6 in December and he checks his blood sugar once a week. He does not have any physicians outside the [place] system. He does not recall any vascular duplex studies done either for arterial or venous disease but was told to wear compression stockings which he does not use 05/30/2016 -- we have not yet received any of his notes from the [facility] hospital system and his arterial and venous duplex studies are scheduled here in [place] around mid February. We are unable to have his insurance accepted by home health agencies and hence he is getting dressings only once a week. 06/06/16 -- -- I received a call from the patient's PCP at the [facility] at [facility] and spoke to Dr. Garvin Fila, phone number [phone] and fax number [phone]. She  confirmed that no vascular testing was done over the last 5 years and she would be happy to do them if the patient did want them to be done at the [place] and we could fax him a request. Readmission: 68 year old male seen by as in February of this year and was referred to vein and vascular for studies and opinion from the vascular surgeons. The patient returns today with a fresh problem having had blisters on his left lower extremity which have been there for about 5 days and he clearly states that he has been wearing his compression stockings as advised though he could not read the moderate compression and has been wearing light compression. Review of his electronic medical records note that he had lower extremity arterial duplex examination done on 06/23/2016 which showed no hemodynamically significant stenosis in the bilateral lower extremity arterial system. He also had a lower extremity venous reflux examination done on 07/07/2016 and it was noted that he had venous incompetence in the right great saphenous vein and bilateral common femoral veins. Patient was seen by Dr. Tamala Julian on the same day and for some reason his notes do not reflect the venous studies or the arterial studies and he recommended patient do a venous duplex ultrasound to look for reflux and return to see him.he would also consider a lymph pump if required. The patient was told that his workup was normal and hence the patient canceled his follow-up appointment. 02/03/17 on evaluation today patient left medial lower extremity blister appears to be doing about the same. It is still continuing to drain and there's still the blistered skin covering the wound bed which is making it difficult for the alternate to do its job. Fortunately there is no evidence of cellulitis. No fevers chills noted. Patient states in general he is not having any significant discomfort. Patient's lower extremity arterial duplex exam revealed that patient was  hemodynamically stable with no evidence of stenosis in regard to the bilateral lower extremities. The lower extremity venous reflux exam revealed the patient had  venous incontinence noted in the right greater saphenous Odell, [name] ([id number]) and bilateral common femoral vein. There is no evidence of deep or superficial vein thrombosis in the bilateral lower extremities. Readmission: 11/12/18 Patient presents for evaluation our clinic today concerning issues that he is having with his left lower extremity. He tells me that a couple weeks ago he began developing blisters on the left lower extremity along with increased swelling. He typically wears his compression stockings on a regular basis is previously been evaluated both here as well is with vascular surgery they would recommend lymphedema pumps but unfortunately that somehow fell through and he never heard anything back from that. Nonetheless I think lymphedema pumps would be beneficial for this patient. He does have a history of hypertension and diabetes. Obviously the chronic venous stasis and lymphedema as well. At this point the blisters have been given in more trouble he states sometimes when the blisters openings able to clean it down with alcohol and it will dry out and do well. Unfortunately that has not been the case this time. He is having some discomfort although this mean these with cleaning the areas he doesn't have discomfort just on a regular basis. He has not been able to wear his compression stockings since the blisters arose due to the fact that of course it will drain into the socks causing additional issues and he didn't have any way to wrap this otherwise. He has increased to taking his Lasix every day instead of every other day. He sees his primary care provider later this month as well. No fevers, chills, nausea, or vomiting noted at this time. 11/19/18-Patient returns at 1 week, per intake RN the amount of seepage into  the compression wraps was definitely improved, overall all the wounds are measuring smaller but continuing silver alginate to the wounds as primary dressing 11/26/18 on evaluation today patient appears to be doing quite well in regard to his left lower Trinity ulcers. In fact of the areas that were noted initially he only has two regions still open. There is no evidence of active infection at this time. He still is not heard anything from the company regarding lymphedema pumps as of yet. Again as previously seen vascular they have not recommended any surgical intervention. 12/03/2018 on evaluation today patient actually appears to be doing quite well with regard to his lower extremity ulcers. In fact most of the areas appear to be healed the one spot which does not seem to be completely healed I am unsure of whether or not this is really draining that much but nonetheless there does not appear to be any signs of infection or significant drainage at this point. There is no sign of fever, chills, nausea, vomiting, or diarrhea. Overall I am pleased with how things have progressed I think is very close to being able to transition to his home compression stockings. 12/10/2018 upon evaluation today patient appears to be doing quite well with regard to his left lower extremity. He has been tolerating the dressing changes without complication. Fortunately there is no signs of active infection at this time. He appears after thorough evaluation of his leg to only have 1 small area that remains open at this point everything else appears to be almost completely closed. He still have significant swelling of the left lower extremity. We had discussed discussing this with his primary care provider he is not able to see her in person they were at the [facility] According to the patient anyway. Subsequently he did speak with her apparently and his primary  care provider feels that he may likely have a DVT. With that being said she has not seen his leg she is just going off of his history. Nonetheless that is a concern that the patient now has as well and while I do not feel the DVT is likely we can definitely ensure that that is not the case I will go ahead and see about putting that order in today. Nonetheless otherwise I am in a recommend that we continue with the current wound care measures including the compression therapy most likely. We just need to ensure that his leg is indeed free of any DVTs. Patient History Information obtained from Patient. Family History Cancer - Mother, Stroke - Father, No family history of Diabetes, Heart Disease, Hereditary Spherocytosis, Hypertension, Kidney Disease, Lung Disease, Seizures, Thyroid Problems, Tuberculosis. Social History Former smoker - quit 5 years ago, Marital Status - Divorced, Alcohol Use - Never, Drug Use - No History, Caffeine Use - Daily. Medical History Cardiovascular Patient has history of Hypertension, Peripheral Venous Disease Denies history of Angina, Arrhythmia, Congestive Heart Failure, Coronary Artery Disease, Deep Vein Thrombosis, JAMESEN, STAHNKE ([id number]) Hypotension, Myocardial Infarction, Peripheral Arterial Disease, Phlebitis, Vasculitis Endocrine Patient has history of Type II Diabetes Integumentary (Skin) Denies history of History of Burn, History of pressure wounds Neurologic Patient has history of Neuropathy Denies history of Dementia, Quadriplegia, Paraplegia, Seizure Disorder Oncologic Patient has history of Received Chemotherapy Denies history of Received Radiation Medical And Surgical History Notes 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 Respiratory Denies complaints or symptoms of Chronic or frequent coughs,  Shortness of Breath. Cardiovascular Complains or has symptoms of LE edema. Denies complaints or symptoms of Chest pain. Psychiatric Denies complaints or symptoms of Anxiety, Claustrophobia. Objective Constitutional Well-nourished and well-hydrated in no acute distress. Vitals Time Taken: 9:21 AM, Height: 73 in, Weight: 300 lbs, BMI: 39.6, Temperature: 99.1 F, Pulse: 58 bpm, Respiratory Rate: 16 breaths/min, Blood Pressure: 135/45 mmHg. Respiratory normal breathing without difficulty. clear to auscultation bilaterally. Cardiovascular regular rate and rhythm with normal S1, S2. Psychiatric this patient is able to make decisions and demonstrates good insight into disease process. Alert and Oriented x 3. pleasant and cooperative. General Notes: Upon inspection today patient's wound bed actually again showed some eschar covering the surface of the wound I did have to remove this once eschar was removed down to good subcutaneous tissue it revealed that he really did not have any significant opening there was just a very small area at this point this is very close to complete resolution. Overall very pleased in this regard. Pocahontas, [name] ([id number]) Integumentary (Hair, Skin) Wound #6 status is Open. Original cause of wound was Blister. The wound is located on the Left,Lateral Lower Leg. The wound measures 0.1cm length x 0.1cm width x 0.1cm depth; 0.008cm^2 area and 0.001cm^3 volume. There is Fat Layer (Subcutaneous Tissue) Exposed exposed. There is no tunneling or undermining noted. There is a none present amount of drainage noted. The wound margin is flat and intact. There is no granulation within the wound bed. There is a large (67-100%) amount of necrotic tissue within the wound bed including Eschar. Wound #7 status is Open. Original cause of wound was Blister. The wound is located on the Left,Distal,Lateral Lower  Leg. The wound measures 0.1cm length x 0.1cm width x 0.1cm depth;  0.008cm^2 area and 0.001cm^3 volume. The wound is limited to skin breakdown. There is no tunneling or undermining noted. There is a none present amount of drainage noted. The wound margin is flat and intact. There is no granulation within the wound bed. There is a large (67-100%) amount of necrotic tissue within the wound bed including Eschar. Assessment Active Problems ICD-10 Type 2 diabetes mellitus with other skin ulcer Lymphedema, not elsewhere classified Venous insufficiency (chronic) (peripheral) Non-pressure chronic ulcer of other part of left lower leg with fat layer exposed Essential (primary) hypertension Procedures Wound #7 Pre-procedure diagnosis of Wound #7 is a Diabetic Wound/Ulcer of the Lower Extremity located on the Left,Distal,Lateral Lower Leg .Severity of Tissue Pre Debridement is: Fat layer exposed. There was a Excisional Skin/Subcutaneous Tissue Debridement with a total area of 0.01 sq cm performed by STONE III, Anthoney Sheppard E., PA-C. With the following instrument(s): Curette to remove Viable and Non-Viable tissue/material. Material removed includes Eschar, Subcutaneous Tissue, and Slough after achieving pain control using Lidocaine. No specimens were taken. A time out was conducted at 09:34, prior to the start of the procedure. A Minimum amount of bleeding was controlled with Pressure. The procedure was tolerated well with a pain level of 0 throughout and a pain level of 0 following the procedure. Post Debridement Measurements: 0.1cm length x 0.1cm width x 0.1cm depth; 0.001cm^3 volume. Character of Wound/Ulcer Post Debridement is improved. Severity of Tissue Post Debridement is: Fat layer exposed. Post procedure Diagnosis Wound #7: Same as Pre-Procedure Plan Wound Cleansing: May shower with protection. - Can cover with bag and tape tightly so not to leak or purchase a cast protector to keep wrap dry Primary Wound Dressing: Wound #6 Left,Lateral Lower Leg: NYKO, GELL  ([id number]) Silver Alginate Wound #7 Left,Distal,Lateral Lower Leg: Silver Alginate Secondary Dressing: Wound #6 Left,Lateral Lower Leg: ABD pad Wound #7 Left,Distal,Lateral Lower Leg: ABD pad Dressing Change Frequency: Wound #6 Left,Lateral Lower Leg: Change dressing every week Wound #7 Left,Distal,Lateral Lower Leg: Change dressing every week Follow-up Appointments: Wound #6 Left,Lateral Lower Leg: Return Appointment in 1 week. - Nurse visit after Ultrasound Test Wound #7 Left,Distal,Lateral Lower Leg: Return Appointment in 1 week. - Nurse visit after Ultrasound Test Edema Control: Wound #6 Left,Lateral Lower Leg: Other: - Ker;ix and Tubi Grip G Wound #7 Left,Distal,Lateral Lower Leg: Other: - Ker;ix and Tubi Grip G Off-Loading: Wound #6 Left,Lateral Lower Leg: Other: - Elevate leg while sitting, to aid in the reduction of swelling. Wound #7 Left,Distal,Lateral Lower Leg: Other: - Elevate leg while sitting, to aid in the reduction of swelling. 1. I would recommend that we go ahead and order the DVT study at this point I think that this is appropriate and hopefully will ensure that there is again no DVT and then we can proceed with higher normal care as above. 2. I would hold off on wrapping his leg for the time being again due to ordering the DVT study they obviously will not be able to do this with him wrapped. 3. In the interim we will utilize Tubigrip to help with some compression while this will also be something he can remove for his DVT study. 4. I still recommend that he attempt to elevate his legs as much as possible this will in turn continue to help with the swelling is much as possible. 5. We will contact him with the results of the DVT study as soon as we  get those back and then subsequently we will get him in for a nurse visit to rewrap his leg with a 3 layer compression wrap at that point. We will see patient back for reevaluation in 1 week here in the clinic.  If anything worsens or changes patient will contact our office for additional recommendations. Electronic Signature(s) Signed: 12/10/2018 1:22:54 PM By: Worthy Keeler PA-C Entered By: Worthy Keeler on 12/10/2018 13:22:54 [name] ([id number]) -------------------------------------------------------------------------------- ROS/PFSH Details Patient Name: [name] Date of Service: 12/10/2018 9:15 AM Medical Record Number: [id number] Patient Account Number: [id number] Date of Birth/Sex: July 29, 1950 (68 y.o. M) Treating RN: Harold Barban Primary Care Provider: SYSTEM, PCP Other Clinician: Referring Provider: Referral, Self Treating Provider/Extender: STONE III, Anjannette Gauger Weeks in Treatment: 4 Information Obtained From Patient Constitutional Symptoms (General Health) Complaints and Symptoms: Negative for: Fatigue; Fever; Chills; Marked Weight Change Respiratory Complaints and Symptoms: Negative for: Chronic or frequent coughs; Shortness of Breath Cardiovascular Complaints and Symptoms: Positive for: LE edema Negative for: Chest pain Medical History: Positive for: Hypertension; Peripheral Venous Disease Negative for: Angina; Arrhythmia; Congestive Heart Failure; Coronary Artery Disease; Deep Vein Thrombosis; Hypotension; Myocardial Infarction; Peripheral Arterial Disease; Phlebitis; Vasculitis Psychiatric Complaints and Symptoms: Negative for: Anxiety; Claustrophobia Endocrine Medical History: Positive for: Type II Diabetes Treated with: Oral agents Integumentary (Skin) Medical History: Negative for: History of Burn; History of pressure wounds Neurologic Medical History: Positive for: Neuropathy Negative for: Dementia; Quadriplegia; Paraplegia; Seizure Disorder Oncologic Medical History: Positive for: Received Chemotherapy DEMETRIC, DUNNAWAY ([id number]) Negative for: Received Radiation Past Medical History Notes: leukemia - 1995 - received  chemo Immunizations Pneumococcal Vaccine: Received Pneumococcal Vaccination: Yes Immunization Notes: up to date Implantable Devices None Family and Social History Cancer: Yes - Mother; Diabetes: No; Heart Disease: No; Hereditary Spherocytosis: No; Hypertension: No; Kidney Disease: No; Lung Disease: No; Seizures: No; Stroke: Yes - Father; Thyroid Problems: No; Tuberculosis: No; Former smoker - quit 5 years ago; Marital Status - Divorced; Alcohol Use: Never; Drug Use: No History; Caffeine Use: Daily; Financial Concerns: No; Food, Clothing or Shelter Needs: No; Support System Lacking: No; Transportation Concerns: No Physician Affirmation I have reviewed and agree with the above information. Electronic Signature(s) Signed: 12/10/2018 4:11:30 PM By: Harold Barban Signed: 12/10/2018 6:32:30 PM By: Worthy Keeler PA-C Entered By: Worthy Keeler on 12/10/2018 13:21:41 [name] ([id number]) -------------------------------------------------------------------------------- SuperBill Details Patient Name: [name] Date of Service: 12/10/2018 Medical Record Number: [id number] Patient Account Number: [id number] Date of Birth/Sex: 27-Jan-1951 (68 y.o. M) Treating RN: Harold Barban Primary Care Provider: SYSTEM, PCP Other Clinician: Referring Provider: Referral, Self Treating Provider/Extender: STONE III, Niambi Smoak Weeks in Treatment: 4 Diagnosis Coding ICD-10 Codes Code Description E11.622 Type 2 diabetes mellitus with other skin ulcer I89.0 Lymphedema, not elsewhere classified I87.2 Venous insufficiency (chronic) (peripheral) L97.822 Non-pressure chronic ulcer of other part of left lower leg with fat layer exposed I10 Essential (primary) hypertension Facility Procedures CPT4 Code Description: [id number] 11042 - DEB SUBQ TISSUE 20 SQ CM/< ICD-10 Diagnosis Description L97.822 Non-pressure chronic ulcer of other part of left lower leg with Modifier: fat layer expos Quantity: 1 ed Physician  Procedures CPT4 Code Description: [id number] - WC PHYS LEVEL 4 - EST PT ICD-10 Diagnosis Description E11.622 Type 2 diabetes mellitus with other skin ulcer I89.0 Lymphedema, not elsewhere classified I87.2 Venous insufficiency (chronic) (peripheral) L97.822  Non-pressure chronic ulcer of other part of left lower leg with Modifier: 25 fat layer expos Quantity: 1 ed CPT4 Code Description: [id number] - WC PHYS SUBQ TISS  20 SQ CM ICD-10 Diagnosis Description L97.822 Non-pressure chronic ulcer of other part of left lower leg with Modifier: fat layer expos Quantity: 1 ed Electronic Signature(s) Signed: 12/10/2018 1:23:19 PM By: Worthy Keeler PA-C Entered By: Worthy Keeler on 12/10/2018 13:23:19

## 2018-12-10 NOTE — Progress Notes (Signed)
LEDFORD, GOODSON ([id number]) Visit Report for 12/10/2018 Arrival Information Details Patient Name: [name], [name] Date of Service: 12/10/2018 9:15 AM Medical Record Number: [id number] Patient Account Number: [id number] Date of Birth/Sex: [birth year] (68 y.o. M) Treating RN: Army Melia Primary Care Vernida Mcnicholas: SYSTEM, PCP Other Clinician: Referring Juquan Reznick: Referral, Self Treating Nikolette Reindl/Extender: STONE III, [name] Weeks in Treatment: 4 Visit Information History Since Last Visit Added or deleted any medications: No Patient Arrived: Ambulatory Any new allergies or adverse reactions: No Arrival Time: 09:20 Had a fall or experienced change in No Accompanied By: self activities of daily living that may affect Transfer Assistance: None risk of falls: Patient Has Alerts: Yes Signs or symptoms of abuse/neglect since last visito No Patient Alerts: DMII Hospitalized since last visit: No Has Dressing in Place as Prescribed: Yes Pain Present Now: No Electronic Signature(s) Signed: 12/10/2018 1:41:30 PM By: Army Melia Entered By: Army Melia on 12/10/2018 09:21:08 [name] ([id number]) -------------------------------------------------------------------------------- Encounter Discharge Information Details Patient Name: [name] Date of Service: 12/10/2018 9:15 AM Medical Record Number: [id number] Patient Account Number: [id number] Date of Birth/Sex: 1950/10/04 (68 y.o. M) Treating RN: Army Melia Primary Care Amario Longmore: SYSTEM, PCP Other Clinician: Referring Usbaldo Pannone: Referral, Self Treating Shakil Dirk/Extender: STONE III, [name] Weeks in Treatment: 4 Encounter Discharge Information Items Post Procedure Vitals Discharge Condition: Stable Temperature (F): 99.1 Ambulatory Status: Ambulatory Pulse (bpm): 58 Discharge Destination: Home Respiratory Rate (breaths/min): 16 Transportation: Private Auto Blood Pressure (mmHg): 135/45 Accompanied By: self Schedule Follow-up Appointment:  Yes Clinical Summary of Care: Electronic Signature(s) Signed: 12/10/2018 1:41:30 PM By: Army Melia Entered By: Army Melia on 12/10/2018 09:50:26 [name] ([id number]) -------------------------------------------------------------------------------- Lower Extremity Assessment Details Patient Name: [name] Date of Service: 12/10/2018 9:15 AM Medical Record Number: [id number] Patient Account Number: [id number] Date of Birth/Sex: 1950/06/13 (68 y.o. M) Treating RN: Army Melia Primary Care Dniya Neuhaus: SYSTEM, PCP Other Clinician: Referring Tranisha Tissue: Referral, Self Treating Haden Cavenaugh/Extender: STONE III, [name] Weeks in Treatment: 4 Edema Assessment Assessed: [Left: No] [Right: No] Edema: [Left: N] [Right: o] Vascular Assessment Pulses: Dorsalis Pedis Palpable: [Left:Yes] Electronic Signature(s) Signed: 12/10/2018 1:41:30 PM By: Army Melia Entered By: Army Melia on 12/10/2018 09:27:47 [name] ([id number]) -------------------------------------------------------------------------------- Multi Wound Chart Details Patient Name: [name] Date of Service: 12/10/2018 9:15 AM Medical Record Number: [id number] Patient Account Number: [id number] Date of Birth/Sex: 19-Feb-1951 (68 y.o. M) Treating RN: Harold Barban Primary Care Yonatan Guitron: SYSTEM, PCP Other Clinician: Referring Naythan Douthit: Referral, Self Treating Zakiyah Diop/Extender: STONE III, [name] Weeks in Treatment: 4 Vital Signs Height(in): 73 Pulse(bpm): 51 Weight(lbs): 300 Blood Pressure(mmHg): 135/45 Body Mass Index(BMI): 40 Temperature(F): 99.1 Respiratory Rate 16 (breaths/min): Photos: [N/A:N/A] Wound Location: Left Lower Leg - Lateral Left Lower Leg - Lateral, Distal N/A Wounding Event: Blister Blister N/A Primary Etiology: Diabetic Wound/Ulcer of the Diabetic Wound/Ulcer of the N/A Lower Extremity Lower Extremity Secondary Etiology: Venous Leg Ulcer Venous Leg Ulcer N/A Comorbid History: Hypertension,  Peripheral Hypertension, Peripheral N/A Venous Disease, Type II Venous Disease, Type II Diabetes, Neuropathy, Diabetes, Neuropathy, Received Chemotherapy Received Chemotherapy Date Acquired: 10/29/2018 10/29/2018 N/A Weeks of Treatment: 4 4 N/A Wound Status: Open Open N/A Clustered Wound: Yes No N/A Clustered Quantity: 2 N/A N/A Measurements L x W x D 0.1x0.1x0.1 0.1x0.1x0.1 N/A (cm) Area (cm) : 0.008 0.008 N/A Volume (cm) : 0.001 0.001 N/A % Reduction in Area: 100.00% 100.00% N/A % Reduction in Volume: 99.90% 100.00% N/A Classification: Grade 1 Grade 1 N/A Exudate Amount: None Present None Present N/A Wound Margin: Flat and Intact Flat and Intact N/A Granulation Amount: None  Present (0%) None Present (0%) N/A Necrotic Amount: Large (67-100%) Large (67-100%) N/A Necrotic Tissue: Eschar Eschar N/A Exposed Structures: Fat Layer (Subcutaneous Fascia: No N/A Tissue) Exposed: Yes Fat Layer (Subcutaneous Fascia: No Tissue) Exposed: No Drewry, Rc ([id number]) Tendon: No Tendon: No Muscle: No Muscle: No Joint: No Joint: No Bone: No Bone: No Limited to Skin Breakdown Epithelialization: Large (67-100%) Large (67-100%) N/A Treatment Notes Electronic Signature(s) Signed: 12/10/2018 4:11:30 PM By: Harold Barban Entered By: Harold Barban on 12/10/2018 09:31:20 [name] ([id number]) -------------------------------------------------------------------------------- Multi-Disciplinary Care Plan Details Patient Name: [name] Date of Service: 12/10/2018 9:15 AM Medical Record Number: [id number] Patient Account Number: [id number] Date of Birth/Sex: 08/05/1950 (68 y.o. M) Treating RN: Harold Barban Primary Care Billijo Dilling: SYSTEM, PCP Other Clinician: Referring Earsel Shouse: Referral, Self Treating Bellatrix Devonshire/Extender: STONE III, [name] Weeks in Treatment: 4 Active Inactive Venous Leg Ulcer Nursing Diagnoses: Actual venous Insuffiency (use after diagnosis is confirmed) Knowledge  deficit related to disease process and management Goals: Patient will maintain optimal edema control Date Initiated: 11/12/2018 Target Resolution Date: 12/13/2018 Goal Status: Active Patient/caregiver will verbalize understanding of disease process and disease management Date Initiated: 11/12/2018 Target Resolution Date: 12/13/2018 Goal Status: Active Interventions: Assess peripheral edema status every visit. Compression as ordered Provide education on venous insufficiency Notes: Wound/Skin Impairment Nursing Diagnoses: Impaired tissue integrity Knowledge deficit related to ulceration/compromised skin integrity Goals: Ulcer/skin breakdown will have a volume reduction of 30% by week 4 Date Initiated: 11/12/2018 Target Resolution Date: 12/13/2018 Goal Status: Active Interventions: Assess patient/caregiver ability to obtain necessary supplies Assess patient/caregiver ability to perform ulcer/skin care regimen upon admission and as needed Assess ulceration(s) every visit Provide education on ulcer and skin care Notes: [name], HOLLENBAUGH ([id number]) Electronic Signature(s) Signed: 12/10/2018 4:11:30 PM By: Harold Barban Entered By: Harold Barban on 12/10/2018 09:30:28 [name] ([id number]) -------------------------------------------------------------------------------- Pain Assessment Details Patient Name: [name] Date of Service: 12/10/2018 9:15 AM Medical Record Number: [id number] Patient Account Number: [id number] Date of Birth/Sex: January 30, 1951 (68 y.o. M) Treating RN: Army Melia Primary Care Amellia Panik: SYSTEM, PCP Other Clinician: Referring Tyresse Jayson: Referral, Self Treating Uliana Brinker/Extender: STONE III, [name] Weeks in Treatment: 4 Active Problems Location of Pain Severity and Description of Pain Patient Has Paino No Site Locations Pain Management and Medication Current Pain Management: Electronic Signature(s) Signed: 12/10/2018 1:41:30 PM By: Army Melia Entered By: Army Melia on 12/10/2018 09:21:16 [name] ([id number]) -------------------------------------------------------------------------------- Patient/Caregiver Education Details Patient Name: [name] Date of Service: 12/10/2018 9:15 AM Medical Record Number: [id number] Patient Account Number: [id number] Date of Birth/Gender: [birth year] (68 y.o. M) Treating RN: Harold Barban Primary Care Physician: SYSTEM, PCP Other Clinician: Referring Physician: Referral, Self Treating Physician/Extender: Melburn Hake, [name] Weeks in Treatment: 4 Education Assessment Education Provided To: Patient Education Topics Provided Venous: Handouts: Controlling Swelling with Compression Stockings Methods: Demonstration, Explain/Verbal Responses: State content correctly Wound/Skin Impairment: Handouts: Caring for Your Ulcer Methods: Demonstration, Explain/Verbal Responses: State content correctly Electronic Signature(s) Signed: 12/10/2018 4:11:30 PM By: Harold Barban Entered By: Harold Barban on 12/10/2018 09:31:51 [name] ([id number]) -------------------------------------------------------------------------------- Wound Assessment Details Patient Name: [name] Date of Service: 12/10/2018 9:15 AM Medical Record Number: [id number] Patient Account Number: [id number] Date of Birth/Sex: 12/11/1950 (68 y.o. M) Treating RN: Army Melia Primary Care Raef Sprigg: SYSTEM, PCP Other Clinician: Referring Berneda Piccininni: Referral, Self Treating Tayvia Faughnan/Extender: STONE III, [name] Weeks in Treatment: 4 Wound Status Wound Number: 6 Primary Diabetic Wound/Ulcer of the Lower Extremity Etiology: Wound Location: Left Lower Leg - Lateral Secondary Venous Leg Ulcer Wounding Event: Blister Etiology: Date Acquired: 10/29/2018 Wound  Open Weeks Of Treatment: 4 Status: Clustered Wound: Yes Comorbid Hypertension, Peripheral Venous Disease, History: Type II Diabetes, Neuropathy, Received Chemotherapy Photos Wound  Measurements Length: (cm) 0.1 % Reducti Width: (cm) 0.1 % Reducti Depth: (cm) 0.1 Epithelia Clustered Quantity: 2 Tunneling Area: (cm) 0.008 Undermin Volume: (cm) 0.001 on in Area: 100% on in Volume: 99.9% lization: Large (67-100%) : No ing: No Wound Description Classification: Grade 1 Foul Odor Wound Margin: Flat and Intact Slough/Fi Exudate Amount: None Present After Cleansing: No brino Yes Wound Bed Granulation Amount: None Present (0%) Exposed Structure Necrotic Amount: Large (67-100%) Fascia Exposed: No Necrotic Quality: Eschar Fat Layer (Subcutaneous Tissue) Exposed: Yes Tendon Exposed: No Muscle Exposed: No Joint Exposed: No Bone Exposed: No Gater, Taichi ([id number]) Treatment Notes Wound #6 (Left, Lateral Lower Leg) Notes prisma, ABD, kerlix, Tubigrip G Electronic Signature(s) Signed: 12/10/2018 1:41:30 PM By: Army Melia Entered By: Army Melia on 12/10/2018 09:27:34 [name] ([id number]) -------------------------------------------------------------------------------- Wound Assessment Details Patient Name: [name] Date of Service: 12/10/2018 9:15 AM Medical Record Number: [id number] Patient Account Number: [id number] Date of Birth/Sex: 04/01/1951 (68 y.o. M) Treating RN: Army Melia Primary Care Calub Tarnow: SYSTEM, PCP Other Clinician: Referring Amelya Mabry: Referral, Self Treating Lavanna Rog/Extender: STONE III, [name] Weeks in Treatment: 4 Wound Status Wound Number: 7 Primary Diabetic Wound/Ulcer of the Lower Extremity Etiology: Wound Location: Left Lower Leg - Lateral, Distal Secondary Venous Leg Ulcer Wounding Event: Blister Etiology: Date Acquired: 10/29/2018 Wound Open Weeks Of Treatment: 4 Status: Clustered Wound: No Comorbid Hypertension, Peripheral Venous Disease, History: Type II Diabetes, Neuropathy, Received Chemotherapy Photos Wound Measurements Length: (cm) 0.1 Width: (cm) 0.1 Depth: (cm) 0.1 Area: (cm) 0.008 Volume:  (cm) 0.001 % Reduction in Area: 100% % Reduction in Volume: 100% Epithelialization: Large (67-100%) Tunneling: No Undermining: No Wound Description Classification: Grade 1 Foul Odor Wound Margin: Flat and Intact Slough/Fi Exudate Amount: None Present After Cleansing: No brino No Wound Bed Granulation Amount: None Present (0%) Exposed Structure Necrotic Amount: Large (67-100%) Fascia Exposed: No Necrotic Quality: Eschar Fat Layer (Subcutaneous Tissue) Exposed: No Tendon Exposed: No Muscle Exposed: No Joint Exposed: No Bone Exposed: No Limited to Skin Marcques Wrightsman, [name] ([id number]) Treatment Notes Wound #7 (Left, Distal, Lateral Lower Leg) Notes prisma, ABD, kerlix, Tubigrip G Electronic Signature(s) Signed: 12/10/2018 1:41:30 PM By: Army Melia Entered By: Army Melia on 12/10/2018 09:27:05 [name] ([id number]) -------------------------------------------------------------------------------- Vitals Details Patient Name: [name] Date of Service: 12/10/2018 9:15 AM Medical Record Number: [id number] Patient Account Number: [id number] Date of Birth/Sex: 01/13/1951 (68 y.o. M) Treating RN: Army Melia Primary Care Sondos Wolfman: SYSTEM, PCP Other Clinician: Referring Henleigh Robello: Referral, Self Treating Ellias Mcelreath/Extender: STONE III, [name] Weeks in Treatment: 4 Vital Signs Time Taken: 09:21 Temperature (F): 99.1 Height (in): 73 Pulse (bpm): 58 Weight (lbs): 300 Respiratory Rate (breaths/min): 16 Body Mass Index (BMI): 39.6 Blood Pressure (mmHg): 135/45 Reference Range: 80 - 120 mg / dl Electronic Signature(s) Signed: 12/10/2018 1:41:30 PM By: Army Melia Entered By: Army Melia on 12/10/2018 38:45:36

## 2018-12-12 ENCOUNTER — Ambulatory Visit: Payer: PPO

## 2018-12-13 ENCOUNTER — Other Ambulatory Visit: Payer: Self-pay

## 2018-12-13 DIAGNOSIS — E11622 Type 2 diabetes mellitus with other skin ulcer: Secondary | ICD-10-CM | POA: Diagnosis not present

## 2018-12-14 ENCOUNTER — Ambulatory Visit
Admission: RE | Admit: 2018-12-14 | Discharge: 2018-12-14 | Disposition: A | Discharge status: Home / Self Care | Payer: PPO | Source: Ambulatory Visit | Attending: Physician Assistant | Admitting: Physician Assistant

## 2018-12-14 ENCOUNTER — Other Ambulatory Visit: Payer: Self-pay

## 2018-12-14 DIAGNOSIS — L97822 Non-pressure chronic ulcer of other part of left lower leg with fat layer exposed: Secondary | ICD-10-CM | POA: Insufficient documentation

## 2018-12-14 DIAGNOSIS — M7989 Other specified soft tissue disorders: Secondary | ICD-10-CM | POA: Diagnosis not present

## 2018-12-17 ENCOUNTER — Other Ambulatory Visit: Payer: Self-pay

## 2018-12-17 ENCOUNTER — Encounter: Payer: PPO | Admitting: Physician Assistant

## 2018-12-17 DIAGNOSIS — E11622 Type 2 diabetes mellitus with other skin ulcer: Secondary | ICD-10-CM | POA: Diagnosis not present

## 2018-12-17 DIAGNOSIS — L97229 Non-pressure chronic ulcer of left calf with unspecified severity: Secondary | ICD-10-CM | POA: Diagnosis not present

## 2018-12-17 DIAGNOSIS — L97829 Non-pressure chronic ulcer of other part of left lower leg with unspecified severity: Secondary | ICD-10-CM | POA: Diagnosis not present

## 2018-12-17 NOTE — Progress Notes (Addendum)
[name] ([id number]) Visit Report for 12/17/2018 Chief Complaint Document Details Patient Name: [name], [name] Date of Service: 12/17/2018 9:00 AM Medical Record Number: [id number] Patient Account Number: [id number] Date of Birth/Sex: Jul 27, 1950 (68 y.o. M) Treating RN: [name] Primary Care Provider: SYSTEM, PCP Other Clinician: Referring Provider: Referral, Self Treating Provider/Extender: [name] Weeks in Treatment: 5 Information Obtained from: Patient Chief Complaint Left LE ulcers Electronic Signature(s) Signed: 12/17/2018 9:50:22 AM By: Worthy Keeler PA-C Entered By: Worthy Mooney on 12/17/2018 09:50:21 [name] ([id number]) -------------------------------------------------------------------------------- HPI Details Patient Name: [name] Date of Service: 12/17/2018 9:00 AM Medical Record Number: [id number] Patient Account Number: [id number] Date of Birth/Sex: 08-18-1950 (68 y.o. M) Treating RN: [name] Primary Care Provider: SYSTEM, PCP Other Clinician: Referring Provider: Referral, Self Treating Provider/Extender: [name] Weeks in Treatment: 5 History of Present Illness HPI Description: 68 year old male who presented to the ER with bilateral lower extremity blisters which had started last week. he has a past medical history of leukemia, diabetes mellitus, hypertension, edema of both lower extremities, his recurrent skin infections, peripheral vascular disease, coronary artery disease, congestive heart failure and peripheral neuropathy. in the ER he was given Rocephin and put on Silvadene cream. he was put on oral doxycycline and was asked to follow-up with the [facility]. His last hemoglobin A1c was 6.6 in December and he checks his blood sugar once a week. He does not have any physicians outside the [place] system. He does not recall any vascular duplex studies done either for arterial or venous disease but was told to wear  compression stockings which he does not use 05/30/2016 -- we have not yet received any of his notes from the [facility] hospital system and his arterial and venous duplex studies are scheduled here in [place] around mid February. We are unable to have his insurance accepted by home health agencies and hence he is getting dressings only once a week. 06/06/16 -- -- I received a call from the patient's PCP at the [facility] at [facility] and spoke to Dr. Garvin Fila, phone number [phone] and fax number [phone]. She confirmed that no vascular testing was done over the last 5 years and she would be happy to do them if the patient did want them to be done at the [place] and we could fax him a request. Readmission: 68 year old male seen by as in February of this year and was referred to vein and vascular for studies and opinion from the vascular surgeons. The patient returns today with a fresh problem having had blisters on his left lower extremity which have been there for about 5 days and he clearly states that he has been wearing his compression stockings as advised though he could not read the moderate compression and has been wearing light compression. Review of his electronic medical records note that he had lower extremity arterial duplex examination done on 06/23/2016 which showed no hemodynamically significant stenosis in the bilateral lower extremity arterial system. He also had a lower extremity venous reflux examination done on 07/07/2016 and it was noted that he had venous incompetence in the right great saphenous vein and bilateral common femoral veins. Patient was seen by Dr. Tamala Julian on the same day and for some reason his notes do not reflect the venous studies or the arterial studies and he recommended patient do a venous duplex ultrasound to look for reflux and return to see him.he would also consider a lymph pump if required. The patient was told that  his workup was normal and hence the  patient canceled his follow-up appointment. 02/03/17 on evaluation today patient left medial lower extremity blister appears to be doing about the same. It is still continuing to drain and there's still the blistered skin covering the wound bed which is making it difficult for the alternate to do its job. Fortunately there is no evidence of cellulitis. No fevers chills noted. Patient states in general he is not having any significant discomfort. Patient's lower extremity arterial duplex exam revealed that patient was hemodynamically stable with no evidence of stenosis in regard to the bilateral lower extremities. The lower extremity venous reflux exam revealed the patient had venous incontinence noted in the right greater saphenous and bilateral common femoral vein. There is no evidence of deep or superficial vein thrombosis in the bilateral lower extremities. Readmission: [name] ([id number]) 11/12/18 Patient presents for evaluation our clinic today concerning issues that he is having with his left lower extremity. He tells me that a couple weeks ago he began developing blisters on the left lower extremity along with increased swelling. He typically wears his compression stockings on a regular basis is previously been evaluated both here as well is with vascular surgery they would recommend lymphedema pumps but unfortunately that somehow fell through and he never heard anything back from that. Nonetheless I think lymphedema pumps would be beneficial for this patient. He does have a history of hypertension and diabetes. Obviously the chronic venous stasis and lymphedema as well. At this point the blisters have been given in more trouble he states sometimes when the blisters openings able to clean it down with alcohol and it will dry out and do well. Unfortunately that has not been the case this time. He is having some discomfort although this mean these with cleaning the areas he doesn't have  discomfort just on a regular basis. He has not been able to wear his compression stockings since the blisters arose due to the fact that of course it will drain into the socks causing additional issues and he didn't have any way to wrap this otherwise. He has increased to taking his Lasix every day instead of every other day. He sees his primary care provider later this month as well. No fevers, chills, nausea, or vomiting noted at this time. 11/19/18-Patient returns at 1 week, per intake RN the amount of seepage into the compression wraps was definitely improved, overall all the wounds are measuring smaller but continuing silver alginate to the wounds as primary dressing 11/26/18 on evaluation today patient appears to be doing quite well in regard to his left lower Trinity ulcers. In fact of the areas that were noted initially he only has two regions still open. There is no evidence of active infection at this time. He still is not heard anything from the company regarding lymphedema pumps as of yet. Again as previously seen vascular they have not recommended any surgical intervention. 12/03/2018 on evaluation today patient actually appears to be doing quite well with regard to his lower extremity ulcers. In fact most of the areas appear to be healed the one spot which does not seem to be completely healed I am unsure of whether or not this is really draining that much but nonetheless there does not appear to be any signs of infection or significant drainage at this point. There is no sign of fever, chills, nausea, vomiting, or diarrhea. Overall I am pleased with how things have progressed I think is very close  to being able to transition to his home compression stockings. 12/10/2018 upon evaluation today patient appears to be doing quite well with regard to his left lower extremity. He has been tolerating the dressing changes without complication. Fortunately there is no signs of active infection at  this time. He appears after thorough evaluation of his leg to only have 1 small area that remains open at this point everything else appears to be almost completely closed. He still have significant swelling of the left lower extremity. We had discussed discussing this with his primary care provider he is not able to see her in person they were at the [facility] and right now the [place] is not seeing patients on site. According to the patient anyway. Subsequently he did speak with her apparently and his primary care provider feels that he may likely have a DVT. With that being said she has not seen his leg she is just going off of his history. Nonetheless that is a concern that the patient now has as well and while I do not feel the DVT is likely we can definitely ensure that that is not the case I will go ahead and see about putting that order in today. Nonetheless otherwise I am in a recommend that we continue with the current wound care measures including the compression therapy most likely. We just need to ensure that his leg is indeed free of any DVTs. 12/17/2018 on evaluation today patient actually appears to be completely healed today. He does have 2 very small areas of blistering although this is not anything too significant at this point which is good news. With that being said I am in agreement with the fact that I think he is completely healed at this point. He does want to get back into his compression stocking. The good news is we have gotten approval from insurance for his lymphedema pumps we received a letter since last saw him last week. The other good news is his study did come back and showed no evidence of a DVT. Electronic Signature(s) Signed: 12/17/2018 10:45:44 AM By: Worthy Keeler PA-C Entered By: Worthy Mooney on 12/17/2018 10:45:43 [name] ([id number]) -------------------------------------------------------------------------------- Physical Exam Details Patient Name:  [name] Date of Service: 12/17/2018 9:00 AM Medical Record Number: [id number] Patient Account Number: [id number] Date of Birth/Sex: 07/22/1950 (68 y.o. M) Treating RN: [name] Primary Care Provider: SYSTEM, PCP Other Clinician: Referring Provider: Referral, Self Treating Provider/Extender: [name] Weeks in Treatment: 5 Constitutional Obese and well-hydrated in no acute distress. Respiratory normal breathing without difficulty. clear to auscultation bilaterally. Cardiovascular regular rate and rhythm with normal S1, S2. Psychiatric this patient is able to make decisions and demonstrates good insight into disease process. Alert and Oriented x 3. pleasant and cooperative. Notes Upon inspection today patient has no evidence of active infection at this time. He has been tolerating the dressing changes without complication. He was just using the Tubigrip until today although I do believe he is doing much better at this point. He is ready to get into his compression stocking I think that is okay to do I am also hopeful that lymphedema pumps as soon as we get those together for him would also be of benefit. Electronic Signature(s) Signed: 12/17/2018 10:46:15 AM By: Worthy Keeler PA-C Entered By: Worthy Mooney on 12/17/2018 10:46:15 [name] ([id number]) -------------------------------------------------------------------------------- Physician Orders Details Patient Name: [name] Date of Service: 12/17/2018 9:00 AM Medical Record Number: [id number] Patient Account  Number: [id number] Date of Birth/Sex: 10-21-1950 (68 y.o. M) Treating RN: [name] Primary Care Provider: SYSTEM, PCP Other Clinician: Referring Provider: Referral, Self Treating Provider/Extender: [name] Weeks in Treatment: 5 Verbal / Phone Orders: No Diagnosis Coding ICD-10 Coding Code Description E11.622 Type 2 diabetes mellitus with other skin ulcer I89.0 Lymphedema, not  elsewhere classified I87.2 Venous insufficiency (chronic) (peripheral) L97.822 Non-pressure chronic ulcer of other part of left lower leg with fat layer exposed I10 Essential (primary) hypertension Discharge From [facility] o Discharge from [place] with Letter of [place]. Call with any questions Electronic Signature(s) Signed: 12/17/2018 4:21:56 PM By: [name] Signed: 12/17/2018 5:48:01 PM By: Worthy Keeler PA-C Entered By: [name] on 12/17/2018 10:05:08 [name] ([id number]) -------------------------------------------------------------------------------- Problem List Details Patient Name: [name] Date of Service: 12/17/2018 9:00 AM Medical Record Number: [id number] Patient Account Number: [id number] Date of Birth/Sex: September 05, 1950 (68 y.o. M) Treating RN: [name] Primary Care Provider: SYSTEM, PCP Other Clinician: Referring Provider: Referral, Self Treating Provider/Extender: [name] Weeks in Treatment: 5 Active Problems ICD-10 Evaluated Encounter Code Description Active Date Today Diagnosis E11.622 Type 2 diabetes mellitus with other skin ulcer 11/12/2018 No Yes I89.0 Lymphedema, not elsewhere classified 11/12/2018 No Yes I87.2 Venous insufficiency (chronic) (peripheral) 11/12/2018 No Yes L97.822 Non-pressure chronic ulcer of other part of left lower leg with 11/12/2018 No Yes fat layer exposed I10 Essential (primary) hypertension 11/12/2018 No Yes Inactive Problems Resolved Problems Electronic Signature(s) Signed: 12/17/2018 9:50:13 AM By: Worthy Keeler PA-C Entered By: Worthy Mooney on 12/17/2018 09:50:12 [name] ([id number]) -------------------------------------------------------------------------------- Progress Note Details Patient Name: [name] Date of Service: 12/17/2018 9:00 AM Medical Record Number: [id number] Patient Account Number: [id number] Date of  Birth/Sex: 10/23/1950 (68 y.o. M) Treating RN: [name] Primary Care Provider: SYSTEM, PCP Other Clinician: Referring Provider: Referral, Self Treating Provider/Extender: [name] Weeks in Treatment: 5 Subjective Chief Complaint Information obtained from Patient Left LE ulcers History of Present Illness (HPI) 68 year old male who presented to the ER with bilateral lower extremity blisters which had started last week. he has a past medical history of leukemia, diabetes mellitus, hypertension, edema of both lower extremities, his recurrent skin infections, peripheral vascular disease, coronary artery disease, congestive heart failure and peripheral neuropathy. in the ER he was given Rocephin and put on Silvadene cream. he was put on oral doxycycline and was asked to follow-up with the [facility]. His last hemoglobin A1c was 6.6 in December and he checks his blood sugar once a week. He does not have any physicians outside the [place] system. He does not recall any vascular duplex studies done either for arterial or venous disease but was told to wear compression stockings which he does not use 05/30/2016 -- we have not yet received any of his notes from the [facility] hospital system and his arterial and venous duplex studies are scheduled here in [place] around mid February. We are unable to have his insurance accepted by home health agencies and hence he is getting dressings only once a week. 06/06/16 -- -- I received a call from the patient's PCP at the [facility] at [facility] and spoke to Dr. Garvin Fila, phone number [phone] and fax number [phone]. She confirmed that no vascular testing was done over the last 5 years and she would be happy to do them if the patient did want them to be done at the [place] and we could fax him a request.  Readmission: 69 year old male seen by as in February of this year and was referred to vein and vascular for studies and opinion from the vascular  surgeons. The patient returns today with a fresh problem having had blisters on his left lower extremity which have been there for about 5 days and he clearly states that he has been wearing his compression stockings as advised though he could not read the moderate compression and has been wearing light compression. Review of his electronic medical records note that he had lower extremity arterial duplex examination done on 06/23/2016 which showed no hemodynamically significant stenosis in the bilateral lower extremity arterial system. He also had a lower extremity venous reflux examination done on 07/07/2016 and it was noted that he had venous incompetence in the right great saphenous vein and bilateral common femoral veins. Patient was seen by Dr. Tamala Julian on the same day and for some reason his notes do not reflect the venous studies or the arterial studies and he recommended patient do a venous duplex ultrasound to look for reflux and return to see him.he would also consider a lymph pump if required. The patient was told that his workup was normal and hence the patient canceled his follow-up appointment. 02/03/17 on evaluation today patient left medial lower extremity blister appears to be doing about the same. It is still continuing to drain and there's still the blistered skin covering the wound bed which is making it difficult for the alternate to do its job. Fortunately there is no evidence of cellulitis. No fevers chills noted. Patient states in general he is not having any significant discomfort. Patient's lower extremity arterial duplex exam revealed that patient was hemodynamically stable with no evidence of stenosis in regard to the bilateral lower extremities. The lower extremity venous reflux exam revealed the patient had venous incontinence noted in the right greater saphenous [name], [name] ([id number]) and bilateral common femoral vein. There is no evidence of deep or superficial vein  thrombosis in the bilateral lower extremities. Readmission: 11/12/18 Patient presents for evaluation our clinic today concerning issues that he is having with his left lower extremity. He tells me that a couple weeks ago he began developing blisters on the left lower extremity along with increased swelling. He typically wears his compression stockings on a regular basis is previously been evaluated both here as well is with vascular surgery they would recommend lymphedema pumps but unfortunately that somehow fell through and he never heard anything back from that. Nonetheless I think lymphedema pumps would be beneficial for this patient. He does have a history of hypertension and diabetes. Obviously the chronic venous stasis and lymphedema as well. At this point the blisters have been given in more trouble he states sometimes when the blisters openings able to clean it down with alcohol and it will dry out and do well. Unfortunately that has not been the case this time. He is having some discomfort although this mean these with cleaning the areas he doesn't have discomfort just on a regular basis. He has not been able to wear his compression stockings since the blisters arose due to the fact that of course it will drain into the socks causing additional issues and he didn't have any way to wrap this otherwise. He has increased to taking his Lasix every day instead of every other day. He sees his primary care provider later this month as well. No fevers, chills, nausea, or vomiting noted at this time. 11/19/18-Patient returns at 1 week, per  intake RN the amount of seepage into the compression wraps was definitely improved, overall all the wounds are measuring smaller but continuing silver alginate to the wounds as primary dressing 11/26/18 on evaluation today patient appears to be doing quite well in regard to his left lower Trinity ulcers. In fact of the areas that were noted initially he only has two  regions still open. There is no evidence of active infection at this time. He still is not heard anything from the company regarding lymphedema pumps as of yet. Again as previously seen vascular they have not recommended any surgical intervention. 12/03/2018 on evaluation today patient actually appears to be doing quite well with regard to his lower extremity ulcers. In fact most of the areas appear to be healed the one spot which does not seem to be completely healed I am unsure of whether or not this is really draining that much but nonetheless there does not appear to be any signs of infection or significant drainage at this point. There is no sign of fever, chills, nausea, vomiting, or diarrhea. Overall I am pleased with how things have progressed I think is very close to being able to transition to his home compression stockings. 12/10/2018 upon evaluation today patient appears to be doing quite well with regard to his left lower extremity. He has been tolerating the dressing changes without complication. Fortunately there is no signs of active infection at this time. He appears after thorough evaluation of his leg to only have 1 small area that remains open at this point everything else appears to be almost completely closed. He still have significant swelling of the left lower extremity. We had discussed discussing this with his primary care provider he is not able to see her in person they were at the [facility] and right now the [place] is not seeing patients on site. According to the patient anyway. Subsequently he did speak with her apparently and his primary care provider feels that he may likely have a DVT. With that being said she has not seen his leg she is just going off of his history. Nonetheless that is a concern that the patient now has as well and while I do not feel the DVT is likely we can definitely ensure that that is not the case I will go ahead and see about putting that order in  today. Nonetheless otherwise I am in a recommend that we continue with the current wound care measures including the compression therapy most likely. We just need to ensure that his leg is indeed free of any DVTs. 12/17/2018 on evaluation today patient actually appears to be completely healed today. He does have 2 very small areas of blistering although this is not anything too significant at this point which is good news. With that being said I am in agreement with the fact that I think he is completely healed at this point. He does want to get back into his compression stocking. The good news is we have gotten approval from insurance for his lymphedema pumps we received a letter since last saw him last week. The other good news is his study did come back and showed no evidence of a DVT. Patient History Information obtained from Patient. Family History Cancer - Mother, Stroke - Father, No family history of Diabetes, Heart Disease, Hereditary Spherocytosis, Hypertension, Kidney Disease, Lung Disease, Seizures, Thyroid Problems, Tuberculosis. Social History Former smoker - quit 5 years ago, Marital Status - Divorced, Alcohol Use -  Never, Drug Use - No History, Caffeine Use [name], [name] ([id number]) Daily. Medical History Cardiovascular Patient has history of Hypertension, Peripheral Venous Disease Denies history of Angina, Arrhythmia, Congestive Heart Failure, Coronary Artery Disease, Deep Vein Thrombosis, Hypotension, Myocardial Infarction, Peripheral Arterial Disease, Phlebitis, Vasculitis Endocrine Patient has history of Type II Diabetes Integumentary (Skin) Denies history of History of Burn, History of pressure wounds Neurologic Patient has history of Neuropathy Denies history of Dementia, Quadriplegia, Paraplegia, Seizure Disorder Oncologic Patient has history of Received Chemotherapy Denies history of Received Radiation Medical And Surgical History Notes 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 Respiratory Denies complaints or symptoms of Chronic or frequent coughs, Shortness of Breath. Cardiovascular Complains or has symptoms of LE edema. Denies complaints or symptoms of Chest pain. Psychiatric Denies complaints or symptoms of Anxiety, Claustrophobia. Objective Constitutional Obese and well-hydrated in no acute distress. Vitals Time Taken: 9:05 AM, Height: 73 in, Weight: 300 lbs, BMI: 39.6, Temperature: 98.9 F, Pulse: 52 bpm, Respiratory Rate: 16 breaths/min, Blood Pressure: 128/57 mmHg. Respiratory normal breathing without difficulty. clear to auscultation bilaterally. Cardiovascular regular rate and rhythm with normal S1, S2. Psychiatric this patient is able to make decisions and demonstrates good insight into disease process. Alert and Oriented x 3. pleasant and cooperative. [name], [name] ([id number]) General Notes: Upon inspection today patient has no evidence of active infection at this time. He has been tolerating the dressing changes without complication. He was just using the Tubigrip until today although I do believe he is doing much better at this point. He is ready to get into his compression stocking I think that is okay to do I am also hopeful that lymphedema pumps as soon as we get those together for him would also be of benefit. Integumentary (Hair, Skin) Wound #6 status is Healed - Epithelialized. Original cause of wound was Blister. The wound is located on the Left,Lateral Lower Leg. The wound measures 0cm length x 0cm width x 0cm depth; 0cm^2 area and 0cm^3 volume. Wound #7 status is Healed - Epithelialized. Original cause of wound was Blister. The wound is located on the Left,Distal,Lateral Lower Leg. The wound measures 0cm length x 0cm width x 0cm depth; 0cm^2 area and 0cm^3  volume. Assessment Active Problems ICD-10 Type 2 diabetes mellitus with other skin ulcer Lymphedema, not elsewhere classified Venous insufficiency (chronic) (peripheral) Non-pressure chronic ulcer of other part of left lower leg with fat layer exposed Essential (primary) hypertension Plan [facility]charge [facility]: Discharge from [facility] with Letter of [place]. Call with any questions 1. I am in a suggest that he continue to wear compression stockings on a regular basis day in and day out. 2. I also suggest that he use the lymphedema pumps as directed he should be getting these within the next couple of weeks I hope based on the fact that insurance has approved that as of today. 3. With regard to elevation I still think that elevating his legs as much as possible is good to be of benefit for him. Follow-up as needed. Electronic Signature(s) Signed: 12/17/2018 10:46:53 AM By: Worthy Keeler PA-C Entered By: Worthy Mooney on 12/17/2018 10:46:53 [name] ([id number]) -------------------------------------------------------------------------------- ROS/PFSH Details Patient Name: [name] Date of Service: 12/17/2018 9:00 AM Medical Record Number: [id number] Patient Account Number: [id number] Date of Birth/Sex: 1950-05-31 (68 y.o. M) Treating  RN: [name] Primary Care Provider: SYSTEM, PCP Other Clinician: Referring Provider: Referral, Self Treating Provider/Extender: [name] Weeks in Treatment: 5 Information Obtained From Patient Constitutional Symptoms (General Health) Complaints and Symptoms: Negative for: Fatigue; Fever; Chills; Marked Weight Change Respiratory Complaints and Symptoms: Negative for: Chronic or frequent coughs; Shortness of Breath Cardiovascular Complaints and Symptoms: Positive for: LE edema Negative for: Chest pain Medical History: Positive for: Hypertension; Peripheral  Venous Disease Negative for: Angina; Arrhythmia; Congestive Heart Failure; Coronary Artery Disease; Deep Vein Thrombosis; Hypotension; Myocardial Infarction; Peripheral Arterial Disease; Phlebitis; Vasculitis Psychiatric Complaints and Symptoms: Negative for: Anxiety; Claustrophobia Endocrine Medical History: Positive for: Type II Diabetes Treated with: Oral agents Integumentary (Skin) Medical History: Negative for: History of Burn; History of pressure wounds Neurologic Medical History: Positive for: Neuropathy Negative for: Dementia; Quadriplegia; Paraplegia; Seizure Disorder Oncologic Medical History: Positive for: Received Chemotherapy [name], [name] ([id number]) Negative for: Received Radiation Past Medical History Notes: 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 Immunizations Pneumococcal Vaccine: Received Pneumococcal Vaccination: Yes Immunization Notes: up to date Implantable Devices None Family and Social History Cancer: Yes - Mother; Diabetes: No; Heart Disease: No; Hereditary Spherocytosis: No; Hypertension: No; Kidney Disease: No; Lung Disease: No; Seizures: No; Stroke: Yes - Father; Thyroid Problems: No; Tuberculosis: No; Former smoker - quit 5 years ago; Marital Status - Divorced; Alcohol Use: Never; Drug Use: No History; Caffeine Use: Daily; Financial Concerns: No; Food, Clothing or Shelter Needs: No; Support System Lacking: No; Transportation Concerns: No Physician Affirmation I have reviewed and agree with the above information. Electronic Signature(s) Signed: 12/17/2018 4:21:56 PM By: [name] Signed: 12/17/2018 5:48:01 PM By: Worthy Keeler PA-C Entered By: Worthy Mooney on 12/17/2018 10:45:58 [name] ([id number]) -------------------------------------------------------------------------------- SuperBill Details Patient Name: [name] Date of Service: 12/17/2018 Medical Record Number: [id number] Patient Account Number: [id number] Date of  Birth/Sex: Mar 06, 1951 (68 y.o. M) Treating RN: [name] Primary Care Provider: SYSTEM, PCP Other Clinician: Referring Provider: Referral, Self Treating Provider/Extender: [name] Weeks in Treatment: 5 Diagnosis Coding ICD-10 Codes Code Description E11.622 Type 2 diabetes mellitus with other skin ulcer I89.0 Lymphedema, not elsewhere classified I87.2 Venous insufficiency (chronic) (peripheral) L97.822 Non-pressure chronic ulcer of other part of left lower leg with fat layer exposed I10 Essential (primary) hypertension Facility Procedures CPT4 Code: [id number] Description: 99213 - WOUND CARE VISIT-LEV 3 EST PT Modifier: Quantity: 1 Physician Procedures CPT4 Code Description: 3893734 99214 - WC PHYS LEVEL 4 - EST PT ICD-10 Diagnosis Description E11.622 Type 2 diabetes mellitus with other skin ulcer I89.0 Lymphedema, not elsewhere classified I87.2 Venous insufficiency (chronic) (peripheral) L97.822  Non-pressure chronic ulcer of other part of left lower leg wit Modifier: h fat layer expos Quantity: 1 ed Electronic Signature(s) Signed: 12/17/2018 10:47:06 AM By: Worthy Keeler PA-C Entered By: Worthy Mooney on 12/17/2018 10:47:06

## 2018-12-17 NOTE — Progress Notes (Signed)
TAMI, BARREN ([id number]) Visit Report for 12/17/2018 Arrival Information Details Patient Name: [name], [name] Date of Service: 12/17/2018 9:00 AM Medical Record Number: [id number] Patient Account Number: [id number] Date of Birth/Sex: December 26, 1950 (68 y.o. M) Treating RN: Harold Barban Primary Care Vernelle Wisner: SYSTEM, PCP Other Clinician: Referring Nyasia Baxley: Referral, Self Treating Adonia Porada/Extender: STONE III, [name] Weeks in Treatment: 5 Visit Information History Since Last Visit Added or deleted any medications: No Patient Arrived: Ambulatory Any new allergies or adverse reactions: No Arrival Time: 09:07 Had a fall or experienced change in No Accompanied By: self activities of daily living that may affect Transfer Assistance: None risk of falls: Patient Has Alerts: Yes Signs or symptoms of abuse/neglect since last visito No Patient Alerts: DMII Hospitalized since last visit: No Implantable device outside of the clinic excluding No cellular tissue based products placed in the center since last visit: Has Dressing in Place as Prescribed: Yes Pain Present Now: No Electronic Signature(s) Signed: 12/17/2018 4:33:12 PM By: Lorine Bears RCP, RRT, CHT Entered By: Lorine Bears on 12/17/2018 09:09:01 [name] ([id number]) -------------------------------------------------------------------------------- Clinic Level of Care Assessment Details Patient Name: [name] Date of Service: 12/17/2018 9:00 AM Medical Record Number: [id number] Patient Account Number: [id number] Date of Birth/Sex: [birth year] (68 y.o. M) Treating RN: Harold Barban Primary Care Atisha Hamidi: SYSTEM, PCP Other Clinician: Referring Mushka Laconte: Referral, Self Treating Kymari Lollis/Extender: STONE III, [name] Weeks in Treatment: 5 Clinic Level of Care Assessment Items TOOL 4 Quantity Score []  - Use when only an EandM is performed on FOLLOW-UP visit 0 ASSESSMENTS - Nursing Assessment /  Reassessment X - Reassessment of Co-morbidities (includes updates in patient status) 1 10 X- 1 5 Reassessment of Adherence to Treatment Plan ASSESSMENTS - Wound and Skin Assessment / Reassessment []  - Simple Wound Assessment / Reassessment - one wound 0 X- 2 5 Complex Wound Assessment / Reassessment - multiple wounds []  - 0 Dermatologic / Skin Assessment (not related to wound area) ASSESSMENTS - Focused Assessment []  - Circumferential Edema Measurements - multi extremities 0 []  - 0 Nutritional Assessment / Counseling / Intervention []  - 0 Lower Extremity Assessment (monofilament, tuning fork, pulses) []  - 0 Peripheral Arterial Disease Assessment (using hand held doppler) ASSESSMENTS - Ostomy and/or Continence Assessment and Care []  - Incontinence Assessment and Management 0 []  - 0 Ostomy Care Assessment and Management (repouching, etc.) PROCESS - Coordination of Care X - Simple Patient / Family Education for ongoing care 1 15 []  - 0 Complex (extensive) Patient / Family Education for ongoing care []  - 0 Staff obtains Programmer, systems, Records, Test Results / Process Orders []  - 0 Staff telephones HHA, Nursing Homes / Clarify orders / etc []  - 0 Routine Transfer to another Facility (non-emergent condition) []  - 0 Routine Hospital Admission (non-emergent condition) []  - 0 New Admissions / Biomedical engineer / Ordering NPWT, Apligraf, etc. []  - 0 Emergency Hospital Admission (emergent condition) X- 1 10 Simple Discharge Coordination [place], ANTINIO ([id number]) []  - 0 Complex (extensive) Discharge Coordination PROCESS - Special Needs []  - Pediatric / Minor Patient Management 0 []  - 0 Isolation Patient Management []  - 0 Hearing / Language / Visual special needs []  - 0 Assessment of Community assistance (transportation, D/C planning, etc.) []  - 0 Additional assistance / Altered mentation []  - 0 Support Surface(s) Assessment (bed, cushion, seat, etc.) INTERVENTIONS - Wound  Cleansing / Measurement []  - Simple Wound Cleansing - one wound 0 X- 2 5 Complex Wound Cleansing - multiple wounds X- 1 5 Wound Imaging (photographs - any number  of wounds) []  - 0 Wound Tracing (instead of photographs) []  - 0 Simple Wound Measurement - one wound X- 2 5 Complex Wound Measurement - multiple wounds INTERVENTIONS - Wound Dressings X - Small Wound Dressing one or multiple wounds 2 10 []  - 0 Medium Wound Dressing one or multiple wounds []  - 0 Large Wound Dressing one or multiple wounds []  - 0 Application of Medications - topical []  - 0 Application of Medications - injection INTERVENTIONS - Miscellaneous []  - External ear exam 0 []  - 0 Specimen Collection (cultures, biopsies, blood, body fluids, etc.) []  - 0 Specimen(s) / Culture(s) sent or taken to Lab for analysis []  - 0 Patient Transfer (multiple staff / Civil Service fast streamer / Similar devices) []  - 0 Simple Staple / Suture removal (25 or less) []  - 0 Complex Staple / Suture removal (26 or more) []  - 0 Hypo / Hyperglycemic Management (close monitor of Blood Glucose) []  - 0 Ankle / Brachial Index (ABI) - do not check if billed separately X- 1 5 Vital Signs [name], Mooney ([id number]) Has the patient been seen at the hospital within the last three years: Yes Total Score: 100 Level Of Care: New/Established - Level 3 Electronic Signature(s) Signed: 12/17/2018 4:21:56 PM By: Harold Barban Entered By: Harold Barban on 12/17/2018 09:59:57 [name] ([id number]) -------------------------------------------------------------------------------- Encounter Discharge Information Details Patient Name: [name] Date of Service: 12/17/2018 9:00 AM Medical Record Number: [id number] Patient Account Number: [id number] Date of Birth/Sex: [birth year] (68 y.o. M) Treating RN: Harold Barban Primary Care Kimberlin Scheel: SYSTEM, PCP Other Clinician: Referring Tasmine Hipwell: Referral, Self Treating Ralynn San/Extender: STONE III,  [name] Weeks in Treatment: 5 Encounter Discharge Information Items Discharge Condition: Stable Ambulatory Status: Walker Discharge Destination: Home Transportation: Private Auto Accompanied By: self Schedule Follow-up Appointment: Yes Clinical Summary of Care: Electronic Signature(s) Signed: 12/17/2018 4:21:56 PM By: Harold Barban Entered By: Harold Barban on 12/17/2018 15:20:13 [name] ([id number]) -------------------------------------------------------------------------------- Lower Extremity Assessment Details Patient Name: [name] Date of Service: 12/17/2018 9:00 AM Medical Record Number: [id number] Patient Account Number: [id number] Date of Birth/Sex: 18-Dec-1950 (68 y.o. M) Treating RN: Cornell Barman Primary Care Koree Staheli: SYSTEM, PCP Other Clinician: Referring [place] Skalicky: Referral, Self Treating Trish Mancinelli/Extender: STONE III, [name] Weeks in Treatment: 5 Edema Assessment Assessed: [Left: Yes] [Right: No] Edema: [Left: Ye] [Right: s] Vascular Assessment Pulses: Dorsalis Pedis Palpable: [Left:No] Posterior Tibial Palpable: [Left:No] Notes Unable to palpate pulses due to 3+ pitting edema. Electronic Signature(s) Signed: 12/17/2018 5:04:06 PM By: Gretta Cool, BSN, RN, CWS, Kim RN, BSN Entered By: Gretta Cool, BSN, RN, CWS, Kim on 12/17/2018 09:14:41 [name] ([id number]) -------------------------------------------------------------------------------- Multi Wound Chart Details Patient Name: [name] Date of Service: 12/17/2018 9:00 AM Medical Record Number: [id number] Patient Account Number: [id number] Date of Birth/Sex: 03/01/1951 (68 y.o. M) Treating RN: Harold Barban Primary Care Nizar Cutler: SYSTEM, PCP Other Clinician: Referring Izaak Sahr: Referral, Self Treating Miela Desjardin/Extender: STONE III, [name] Weeks in Treatment: 5 Vital Signs Height(in): 73 Pulse(bpm): 52 Weight(lbs): 300 Blood Pressure(mmHg): 128/57 Body Mass Index(BMI): 40 Temperature(F):  98.9 Respiratory Rate 16 (breaths/min): Photos: [6:No Photos] [N/A:N/A] Wound Location: [6:Left, Lateral Lower Leg] [N/A:N/A] Wounding Event: [6:Blister] [N/A:N/A] Primary Etiology: [6:Diabetic Wound/Ulcer of the Lower Extremity] [N/A:N/A] Secondary Etiology: [6:Venous Leg Ulcer] [N/A:N/A] Date Acquired: [6:10/29/2018] [N/A:N/A] Weeks of Treatment: [6:5] [N/A:N/A] Wound Status: [6:Open] [N/A:N/A] Clustered Wound: [6:Yes] [N/A:N/A] Measurements L x W x D [6:0.1x0.1x0.1] [N/A:N/A] (cm) Area (cm) : [4:6.270] [N/A:N/A] Volume (cm) : [6:0.001] [N/A:N/A] % Reduction in Area: [6:100.00%] [N/A:N/A] % Reduction in Volume: [6:99.90% Grade 1] [N/A:N/A N/A] Treatment Notes Electronic  Signature(s) Signed: 12/17/2018 4:21:56 PM By: Harold Barban Entered By: Harold Barban on 12/17/2018 09:56:41 [name] ([id number]) -------------------------------------------------------------------------------- [place] Details Patient Name: [name] Date of Service: 12/17/2018 9:00 AM Medical Record Number: [id number] Patient Account Number: [id number] Date of Birth/Sex: July 04, 1950 (68 y.o. M) Treating RN: Harold Barban Primary Care Najla Aughenbaugh: SYSTEM, PCP Other Clinician: Referring Daveon Arpino: Referral, Self Treating Valinda Fedie/Extender: STONE III, [name] Weeks in Treatment: 5 Active Inactive Electronic Signature(s) Signed: 12/17/2018 4:21:56 PM By: Harold Barban Entered By: Harold Barban on 12/17/2018 15:19:03 [name] ([id number]) -------------------------------------------------------------------------------- Pain Assessment Details Patient Name: [name] Date of Service: 12/17/2018 9:00 AM Medical Record Number: [id number] Patient Account Number: [id number] Date of Birth/Sex: Jan 17, 1951 (68 y.o. M) Treating RN: Harold Barban Primary Care Donique Hammonds: SYSTEM, PCP Other Clinician: Referring [place]: Referral, Self Treating Kyley Solow/Extender: STONE III,  [name] Weeks in Treatment: 5 Active Problems Location of Pain Severity and Description of Pain Patient Has Paino No Site Locations Pain Management and Medication Current Pain Management: Electronic Signature(s) Signed: 12/17/2018 4:21:56 PM By: Harold Barban Signed: 12/17/2018 4:33:12 PM By: Lorine Bears RCP, RRT, CHT Entered By: Lorine Bears on 12/17/2018 09:09:10 [name] ([id number]) -------------------------------------------------------------------------------- Patient/Caregiver Education Details Patient Name: [name] Date of Service: 12/17/2018 9:00 AM Medical Record Number: [id number] Patient Account Number: [id number] Date of Birth/Gender: 1950/10/19 (68 y.o. M) Treating RN: Harold Barban Primary Care Physician: SYSTEM, PCP Other Clinician: Referring Physician: Referral, Self Treating Physician/Extender: Melburn Hake, [name] Weeks in Treatment: 5 Education Assessment Education Provided To: Patient Education Topics Provided Venous: Handouts: Controlling Swelling with Compression Stockings Methods: Demonstration, Explain/Verbal Responses: State content correctly Wound/Skin Impairment: Handouts: Caring for Your Ulcer Methods: Demonstration, Explain/Verbal Responses: State content correctly Electronic Signature(s) Signed: 12/17/2018 4:21:56 PM By: Harold Barban Entered By: Harold Barban on 12/17/2018 09:57:10 [name] ([id number]) -------------------------------------------------------------------------------- Wound Assessment Details Patient Name: [name] Date of Service: 12/17/2018 9:00 AM Medical Record Number: [id number] Patient Account Number: [id number] Date of Birth/Sex: 1950/12/04 (68 y.o. M) Treating RN: Harold Barban Primary Care Arasely Akkerman: SYSTEM, PCP Other Clinician: Referring Lucy Boardman: Referral, Self Treating [place] Cantero/Extender: STONE III, [name] Weeks in Treatment: 5 Wound Status Wound Number: 6 Primary  Etiology: Diabetic Wound/Ulcer of the Lower Extremity Wound Location: Left, Lateral Lower Leg Secondary Venous Leg Ulcer Wounding Event: Blister Etiology: Date Acquired: 10/29/2018 Wound Status: Healed - Epithelialized Weeks Of Treatment: 5 Clustered Wound: Yes Wound Measurements Length: (cm) 0 % Width: (cm) 0 % Depth: (cm) 0 Area: (cm) 0 Volume: (cm) 0 Reduction in Area: 100% Reduction in Volume: 100% Wound Description Classification: Grade 1 Electronic Signature(s) Signed: 12/17/2018 4:21:56 PM By: Harold Barban Entered By: Harold Barban on 12/17/2018 09:58:43 [name] ([id number]) -------------------------------------------------------------------------------- Wound Assessment Details Patient Name: [name] Date of Service: 12/17/2018 9:00 AM Medical Record Number: [id number] Patient Account Number: [id number] Date of Birth/Sex: 1950/11/02 (68 y.o. M) Treating RN: Harold Barban Primary Care Bubba Vanbenschoten: SYSTEM, PCP Other Clinician: Referring Davan Hark: Referral, Self Treating Zoiee Wimmer/Extender: STONE III, [name] Weeks in Treatment: 5 Wound Status Wound Number: 7 Primary Etiology: Diabetic Wound/Ulcer of the Lower Extremity Wound Location: Left, Distal, Lateral Lower Leg Secondary Venous Leg Ulcer Wounding Event: Blister Etiology: Date Acquired: 10/29/2018 Wound Status: Healed - Epithelialized Weeks Of Treatment: 5 Clustered Wound: No Wound Measurements Length: (cm) 0 % Width: (cm) 0 % Depth: (cm) 0 Area: (cm) 0 Volume: (cm) 0 Reduction in Area: 100% Reduction in Volume: 100% Wound Description Classification: Grade 1 Electronic Signature(s) Signed: 12/17/2018 4:21:56 PM By: Harold Barban Entered By: Harold Barban on 12/17/2018  09:58:52 JATHNIEL, SMELTZER ([id number]) -------------------------------------------------------------------------------- [place] Details Patient Name: JOSEFF, LUCKMAN Date of Service: 12/17/2018 9:00 AM Medical Record  Number: [id number] Patient Account Number: [id number] Date of Birth/Sex: 1950-06-30 (68 y.o. M) Treating RN: Harold Barban Primary Care Doralyn Kirkes: SYSTEM, PCP Other Clinician: Referring Lesia Monica: Referral, Self Treating Nataniel Gasper/Extender: STONE III, [name] Weeks in Treatment: 5 Vital Signs Time Taken: 09:05 Temperature (F): 98.9 Height (in): 73 Pulse (bpm): 52 Weight (lbs): 300 Respiratory Rate (breaths/min): 16 Body Mass Index (BMI): 39.6 Blood Pressure (mmHg): 128/57 Reference Range: 80 - 120 mg / dl Electronic Signature(s) Signed: 12/17/2018 4:33:12 PM By: Lorine Bears RCP, RRT, CHT Entered By: Lorine Bears on 12/17/2018 09:10:33

## 2018-12-18 NOTE — Progress Notes (Signed)
LEEMAN, JOHNSEY ([id number]) Visit Report for 12/13/2018 Arrival Information Details Patient Name: [name], [name] Date of Service: 12/13/2018 3:00 PM Medical Record Number: [id number] Patient Account Number: [id number] Date of Birth/Sex: 07/14/1950 (68 y.o. M) Treating RN: Cornell Barman Primary Care Calbert Hulsebus: SYSTEM, PCP Other Clinician: Referring Marticia Reifschneider: Referral, Self Treating Lyllie Cobbins/Extender: STONE III, [name] Weeks in Treatment: 4 Visit Information History Since Last Visit Added or deleted any medications: No Patient Arrived: Ambulatory Any new allergies or adverse reactions: No Arrival Time: 10:02 Had a fall or experienced change in No Accompanied By: self activities of daily living that may affect Transfer Assistance: None risk of falls: Patient Identification Verified: Yes Signs or symptoms of abuse/neglect since last visito No Secondary Verification Process Completed: Yes Hospitalized since last visit: No Patient Requires Transmission-Based No Implantable device outside of the clinic excluding No Precautions: cellular tissue based products placed in the center Patient Has Alerts: Yes since last visit: Patient Alerts: DMII Pain Present Now: No Electronic Signature(s) Signed: 12/18/2018 10:03:13 AM By: Gretta Cool, BSN, RN, CWS, Kim RN, BSN Previous Signature: 12/18/2018 10:02:29 AM Version By: Gretta Cool, BSN, RN, CWS, Kim RN, BSN Entered By: Gretta Cool, BSN, RN, CWS, Kim on 12/18/2018 10:03:13 [name] ([id number]) -------------------------------------------------------------------------------- Clinic Level of Care Assessment Details Patient Name: [name] Date of Service: 12/13/2018 3:00 PM Medical Record Number: [id number] Patient Account Number: [id number] Date of Birth/Sex: 02-18-1951 (68 y.o. M) Treating RN: Cornell Barman Primary Care Arben Packman: SYSTEM, PCP Other Clinician: Referring Jd Mccaster: Referral, Self Treating [place]/Extender: STONE III, [name] Weeks in Treatment: 4 Clinic  Level of Care Assessment Items TOOL 4 Quantity Score []  - Use when only an EandM is performed on FOLLOW-UP visit 0 ASSESSMENTS - Nursing Assessment / Reassessment []  - Reassessment of Co-morbidities (includes updates in patient status) 0 []  - 0 Reassessment of Adherence to Treatment Plan ASSESSMENTS - Wound and Skin Assessment / Reassessment X - Simple Wound Assessment / Reassessment - one wound 1 5 []  - 0 Complex Wound Assessment / Reassessment - multiple wounds []  - 0 Dermatologic / Skin Assessment (not related to wound area) ASSESSMENTS - Focused Assessment []  - Circumferential Edema Measurements - multi extremities 0 []  - 0 Nutritional Assessment / Counseling / Intervention []  - 0 Lower Extremity Assessment (monofilament, tuning fork, pulses) []  - 0 Peripheral Arterial Disease Assessment (using hand held doppler) ASSESSMENTS - Ostomy and/or Continence Assessment and Care []  - Incontinence Assessment and Management 0 []  - 0 Ostomy Care Assessment and Management (repouching, etc.) PROCESS - Coordination of Care X - Simple Patient / Family Education for ongoing care 1 15 []  - 0 Complex (extensive) Patient / Family Education for ongoing care X- 1 10 Staff obtains Programmer, systems, Records, Test Results / Process Orders []  - 0 Staff telephones HHA, Nursing Homes / Clarify orders / etc []  - 0 Routine Transfer to another Facility (non-emergent condition) []  - 0 Routine Hospital Admission (non-emergent condition) []  - 0 New Admissions / Biomedical engineer / Ordering NPWT, Apligraf, etc. []  - 0 Emergency Hospital Admission (emergent condition) X- 1 10 Simple Discharge Coordination [place], AVREY ([id number]) []  - 0 Complex (extensive) Discharge Coordination PROCESS - Special Needs []  - Pediatric / Minor Patient Management 0 []  - 0 Isolation Patient Management []  - 0 Hearing / Language / Visual special needs []  - 0 Assessment of Community assistance (transportation, D/C  planning, etc.) []  - 0 Additional assistance / Altered mentation []  - 0 Support Surface(s) Assessment (bed, cushion, seat, etc.) INTERVENTIONS - Wound Cleansing / Measurement X - Simple  Wound Cleansing - one wound 1 5 []  - 0 Complex Wound Cleansing - multiple wounds X- 1 5 Wound Imaging (photographs - any number of wounds) []  - 0 Wound Tracing (instead of photographs) X- 1 5 Simple Wound Measurement - one wound []  - 0 Complex Wound Measurement - multiple wounds INTERVENTIONS - Wound Dressings []  - Small Wound Dressing one or multiple wounds 0 []  - 0 Medium Wound Dressing one or multiple wounds X- 1 20 Large Wound Dressing one or multiple wounds []  - 0 Application of Medications - topical []  - 0 Application of Medications - injection INTERVENTIONS - Miscellaneous []  - External ear exam 0 []  - 0 Specimen Collection (cultures, biopsies, blood, body fluids, etc.) []  - 0 Specimen(s) / Culture(s) sent or taken to Lab for analysis []  - 0 Patient Transfer (multiple staff / Civil Service fast streamer / Similar devices) []  - 0 Simple Staple / Suture removal (25 or less) []  - 0 Complex Staple / Suture removal (26 or more) []  - 0 Hypo / Hyperglycemic Management (close monitor of Blood Glucose) []  - 0 Ankle / Brachial Index (ABI) - do not check if billed separately []  - 0 Vital Signs [place], SHELLHAMMER ([id number]) Has the patient been seen at the hospital within the last three years: Yes Total Score: 75 Level Of Care: New/Established - Level 2 Electronic Signature(s) Unsigned Entered By: Gretta Cool, BSN, RN, CWS, Kim on 12/18/2018 10:05:02 Signature(s): Date(s): [name] ([id number]) -------------------------------------------------------------------------------- Encounter Discharge Information Details Patient Name: [name] Date of Service: 12/13/2018 3:00 PM Medical Record Number: [id number] Patient Account Number: [id number] Date of Birth/Sex: 08-31-1950 (68 y.o. M) Treating RN: Cornell Barman Primary Care Mahdi Frye: SYSTEM, PCP Other Clinician: Referring Hildred Pharo: Referral, Self Treating Elianah Karis/Extender: Melburn Hake, [name] Weeks in Treatment: 4 Encounter Discharge Information Items Discharge Condition: Stable Ambulatory Status: Ambulatory Discharge Destination: Home Transportation: Other Accompanied By: self Schedule Follow-up Appointment: Yes Clinical Summary of Care: Electronic Signature(s) Signed: 12/18/2018 10:04:18 AM By: Gretta Cool, BSN, RN, CWS, Kim RN, BSN Entered By: Gretta Cool, BSN, RN, CWS, Kim on 12/18/2018 10:04:17

## 2018-12-20 ENCOUNTER — Other Ambulatory Visit: Payer: Self-pay

## 2018-12-20 ENCOUNTER — Encounter: Payer: PPO | Admitting: Physician Assistant

## 2018-12-20 DIAGNOSIS — L97829 Non-pressure chronic ulcer of other part of left lower leg with unspecified severity: Secondary | ICD-10-CM | POA: Diagnosis not present

## 2018-12-20 DIAGNOSIS — E11622 Type 2 diabetes mellitus with other skin ulcer: Secondary | ICD-10-CM | POA: Diagnosis not present

## 2018-12-20 DIAGNOSIS — I890 Lymphedema, not elsewhere classified: Secondary | ICD-10-CM | POA: Diagnosis not present

## 2018-12-20 NOTE — Progress Notes (Addendum)
AASIM, RESTIVO ([id number]) Visit Report for 12/20/2018 Chief Complaint Document Details Patient Name: [name], [name] Date of Service: 12/20/2018 8:45 AM Medical Record Number: [id number] Patient Account Number: [id number] Date of Birth/Sex: Oct 05, 1950 (68 y.o. M) Treating RN: Army Melia Primary Care Provider: SYSTEM, PCP Other Clinician: Referring Provider: Referral, Self Treating Provider/Extender: STONE III, [name] Weeks in Treatment: 5 Information Obtained from: Patient Chief Complaint Left LE ulcers Electronic Signature(s) Signed: 12/20/2018 9:39:09 AM By: Worthy Keeler PA-C Entered By: Worthy Keeler on 12/20/2018 09:39:08 [name] ([id number]) -------------------------------------------------------------------------------- HPI Details Patient Name: [name] Date of Service: 12/20/2018 8:45 AM Medical Record Number: [id number] Patient Account Number: [id number] Date of Birth/Sex: August 26, 1950 (68 y.o. M) Treating RN: Army Melia Primary Care Provider: SYSTEM, PCP Other Clinician: Referring Provider: Referral, Self Treating Provider/Extender: STONE III, [name] Weeks in Treatment: 5 History of Present Illness HPI Description: 68 year old male who presented to the ER with bilateral lower extremity blisters which had started last week. he has a past medical history of leukemia, diabetes mellitus, hypertension, edema of both lower extremities, his recurrent skin infections, peripheral vascular disease, coronary artery disease, congestive heart failure and peripheral neuropathy. in the ER he was given Rocephin and put on Silvadene cream. he was put on oral doxycycline and was asked to follow-up with the [facility]. His last hemoglobin A1c was 6.6 in December and he checks his blood sugar once a week. He does not have any physicians outside the [place] system. He does not recall any vascular duplex studies done either for arterial or venous disease but was told to wear  compression stockings which he does not use 05/30/2016 -- we have not yet received any of his notes from the [facility] hospital system and his arterial and venous duplex studies are scheduled here in [place] around mid February. We are unable to have his insurance accepted by home health agencies and hence he is getting dressings only once a week. 06/06/16 -- -- I received a call from the patient's PCP at the [facility] at [facility] and spoke to Dr. Garvin Fila, phone number [phone] and fax number [phone]. She confirmed that no vascular testing was done over the last 5 years and she would be happy to do them if the patient did want them to be done at the [place] and we could fax him a request. Readmission: 68 year old male seen by as in February of this year and was referred to vein and vascular for studies and opinion from the vascular surgeons. The patient returns today with a fresh problem having had blisters on his left lower extremity which have been there for about 5 days and he clearly states that he has been wearing his compression stockings as advised though he could not read the moderate compression and has been wearing light compression. Review of his electronic medical records note that he had lower extremity arterial duplex examination done on 06/23/2016 which showed no hemodynamically significant stenosis in the bilateral lower extremity arterial system. He also had a lower extremity venous reflux examination done on 07/07/2016 and it was noted that he had venous incompetence in the right great saphenous vein and bilateral common femoral veins. Patient was seen by Dr. Tamala Julian on the same day and for some reason his notes do not reflect the venous studies or the arterial studies and he recommended patient do a venous duplex ultrasound to look for reflux and return to see him.he would also consider a lymph pump if required. The patient was told that  his workup was normal and hence the  patient canceled his follow-up appointment. 02/03/17 on evaluation today patient left medial lower extremity blister appears to be doing about the same. It is still continuing to drain and there's still the blistered skin covering the wound bed which is making it difficult for the alternate to do its job. Fortunately there is no evidence of cellulitis. No fevers chills noted. Patient states in general he is not having any significant discomfort. Patient's lower extremity arterial duplex exam revealed that patient was hemodynamically stable with no evidence of stenosis in regard to the bilateral lower extremities. The lower extremity venous reflux exam revealed the patient had venous incontinence noted in the right greater saphenous and bilateral common femoral vein. There is no evidence of deep or superficial vein thrombosis in the bilateral lower extremities. Readmission: [name], [name] ([id number]) 11/12/18 Patient presents for evaluation our clinic today concerning issues that he is having with his left lower extremity. He tells me that a couple weeks ago he began developing blisters on the left lower extremity along with increased swelling. He typically wears his compression stockings on a regular basis is previously been evaluated both here as well is with vascular surgery they would recommend lymphedema pumps but unfortunately that somehow fell through and he never heard anything back from that. Nonetheless I think lymphedema pumps would be beneficial for this patient. He does have a history of hypertension and diabetes. Obviously the chronic venous stasis and lymphedema as well. At this point the blisters have been given in more trouble he states sometimes when the blisters openings able to clean it down with alcohol and it will dry out and do well. Unfortunately that has not been the case this time. He is having some discomfort although this mean these with cleaning the areas he doesn't have  discomfort just on a regular basis. He has not been able to wear his compression stockings since the blisters arose due to the fact that of course it will drain into the socks causing additional issues and he didn't have any way to wrap this otherwise. He has increased to taking his Lasix every day instead of every other day. He sees his primary care provider later this month as well. No fevers, chills, nausea, or vomiting noted at this time. 11/19/18-Patient returns at 1 week, per intake RN the amount of seepage into the compression wraps was definitely improved, overall all the wounds are measuring smaller but continuing silver alginate to the wounds as primary dressing 11/26/18 on evaluation today patient appears to be doing quite well in regard to his left lower Trinity ulcers. In fact of the areas that were noted initially he only has two regions still open. There is no evidence of active infection at this time. He still is not heard anything from the company regarding lymphedema pumps as of yet. Again as previously seen vascular they have not recommended any surgical intervention. 12/03/2018 on evaluation today patient actually appears to be doing quite well with regard to his lower extremity ulcers. In fact most of the areas appear to be healed the one spot which does not seem to be completely healed I am unsure of whether or not this is really draining that much but nonetheless there does not appear to be any signs of infection or significant drainage at this point. There is no sign of fever, chills, nausea, vomiting, or diarrhea. Overall I am pleased with how things have progressed I think is very close  to being able to transition to his home compression stockings. 12/10/2018 upon evaluation today patient appears to be doing quite well with regard to his left lower extremity. He has been tolerating the dressing changes without complication. Fortunately there is no signs of active infection at  this time. He appears after thorough evaluation of his leg to only have 1 small area that remains open at this point everything else appears to be almost completely closed. He still have significant swelling of the left lower extremity. We had discussed discussing this with his primary care provider he is not able to see her in person they were at the [facility] and right now the [place] is not seeing patients on site. According to the patient anyway. Subsequently he did speak with her apparently and his primary care provider feels that he may likely have a DVT. With that being said she has not seen his leg she is just going off of his history. Nonetheless that is a concern that the patient now has as well and while I do not feel the DVT is likely we can definitely ensure that that is not the case I will go ahead and see about putting that order in today. Nonetheless otherwise I am in a recommend that we continue with the current wound care measures including the compression therapy most likely. We just need to ensure that his leg is indeed free of any DVTs. 12/17/2018 on evaluation today patient actually appears to be completely healed today. He does have 2 very small areas of blistering although this is not anything too significant at this point which is good news. With that being said I am in agreement with the fact that I think he is completely healed at this point. He does want to get back into his compression stocking. The good news is we have gotten approval from insurance for his lymphedema pumps we received a letter since last saw him last week. The other good news is his study did come back and showed no evidence of a DVT. 12/20/2018 on evaluation today patient presents for follow-up concerning his ongoing issues with his left lower extremity. He was actually discharged last Friday and did fairly well until he states blisters opened this morning. He tells me he has been wearing his compression  stocking although he has a hard time getting this on. There does not appear to be any signs of active infection at this time. No fevers, chills, nausea, vomiting, or diarrhea. Electronic Signature(s) Signed: 12/20/2018 9:50:58 AM By: Worthy Keeler PA-C Entered By: Worthy Keeler on 12/20/2018 09:50:58 [name] ([id number]) -------------------------------------------------------------------------------- Physical Exam Details Patient Name: [name] Date of Service: 12/20/2018 8:45 AM Medical Record Number: [id number] Patient Account Number: [id number] Date of Birth/Sex: 09/23/1950 (68 y.o. M) Treating RN: Army Melia Primary Care Provider: SYSTEM, PCP Other Clinician: Referring Provider: Referral, Self Treating Provider/Extender: STONE III, [name] Weeks in Treatment: 5 Constitutional Well-nourished and well-hydrated in no acute distress. Respiratory normal breathing without difficulty. clear to auscultation bilaterally. Cardiovascular regular rate and rhythm with normal S1, S2. Psychiatric this patient is able to make decisions and demonstrates good insight into disease process. Alert and Oriented x 3. pleasant and cooperative. Notes Patient's leg currently does not show any particular open wounds at this point he does have several blistered areas that are weeping currently. With that being said this does not appear to be too extreme and a hopefully will be able to get this under control fairly  quickly. I think the big thing we need to check on her his lymphedema pumps which he still has not heard from the company regarding at this point. Electronic Signature(s) Signed: 12/20/2018 9:51:47 AM By: Worthy Keeler PA-C Entered By: Worthy Keeler on 12/20/2018 09:51:47 [name] ([id number]) -------------------------------------------------------------------------------- Physician Orders Details Patient Name: [name] Date of Service: 12/20/2018 8:45 AM Medical Record  Number: [id number] Patient Account Number: [id number] Date of Birth/Sex: 03/27/1951 (68 y.o. M) Treating RN: Army Melia Primary Care Provider: SYSTEM, PCP Other Clinician: Referring Provider: Referral, Self Treating Provider/Extender: STONE III, [name] Weeks in Treatment: 5 Verbal / Phone Orders: No Diagnosis Coding ICD-10 Coding Code Description E11.622 Type 2 diabetes mellitus with other skin ulcer I89.0 Lymphedema, not elsewhere classified I87.2 Venous insufficiency (chronic) (peripheral) L97.822 Non-pressure chronic ulcer of other part of left lower leg with fat layer exposed I10 Essential (primary) hypertension Primary Wound Dressing o XtraSorb Dressing Change Frequency o Change dressing every week - nurse visit for Monday in office to re-wrap Follow-up Appointments o Return Appointment in 1 week. Edema Control o 4-Layer Compression System - Left Lower Extremity. Electronic Signature(s) Signed: 12/24/2018 2:15:02 PM By: Army Melia Signed: 12/24/2018 5:18:58 PM By: Worthy Keeler PA-C Previous Signature: 12/20/2018 11:56:24 AM Version By: Army Melia Previous Signature: 12/20/2018 10:42:11 PM Version By: Worthy Keeler PA-C Entered By: Army Melia on 12/24/2018 13:34:18 [name] ([id number]) -------------------------------------------------------------------------------- Problem List Details Patient Name: [name] Date of Service: 12/20/2018 8:45 AM Medical Record Number: [id number] Patient Account Number: [id number] Date of Birth/Sex: 10-Nov-1950 (68 y.o. M) Treating RN: Army Melia Primary Care Provider: SYSTEM, PCP Other Clinician: Referring Provider: Referral, Self Treating Provider/Extender: Melburn Hake, [name] Weeks in Treatment: 5 Active Problems ICD-10 Evaluated Encounter Code Description Active Date Today Diagnosis E11.622 Type 2 diabetes mellitus with other skin ulcer 11/12/2018 No Yes I89.0 Lymphedema, not elsewhere classified 11/12/2018 No  Yes I87.2 Venous insufficiency (chronic) (peripheral) 11/12/2018 No Yes L97.822 Non-pressure chronic ulcer of other part of left lower leg with 11/12/2018 No Yes fat layer exposed I10 Essential (primary) hypertension 11/12/2018 No Yes Inactive Problems Resolved Problems Electronic Signature(s) Signed: 12/20/2018 9:39:03 AM By: Worthy Keeler PA-C Entered By: Worthy Keeler on 12/20/2018 09:39:02 [name] ([id number]) -------------------------------------------------------------------------------- Progress Note Details Patient Name: [name] Date of Service: 12/20/2018 8:45 AM Medical Record Number: [id number] Patient Account Number: [id number] Date of Birth/Sex: 1950-06-15 (68 y.o. M) Treating RN: Army Melia Primary Care Provider: SYSTEM, PCP Other Clinician: Referring Provider: Referral, Self Treating Provider/Extender: STONE III, [name] Weeks in Treatment: 5 Subjective Chief Complaint Information obtained from Patient Left LE ulcers History of Present Illness (HPI) 68 year old male who presented to the ER with bilateral lower extremity blisters which had started last week. he has a past medical history of leukemia, diabetes mellitus, hypertension, edema of both lower extremities, his recurrent skin infections, peripheral vascular disease, coronary artery disease, congestive heart failure and peripheral neuropathy. in the ER he was given Rocephin and put on Silvadene cream. he was put on oral doxycycline and was asked to follow-up with the [facility]. His last hemoglobin A1c was 6.6 in December and he checks his blood sugar once a week. He does not have any physicians outside the [place] system. He does not recall any vascular duplex studies done either for arterial or venous disease but was told to wear compression stockings which he does not use 05/30/2016 -- we have not yet received any of his notes from the [facility] hospital system and  his arterial and venous duplex studies are  scheduled here in [place] around mid February. We are unable to have his insurance accepted by home health agencies and hence he is getting dressings only once a week. 06/06/16 -- -- I received a call from the patient's PCP at the [facility] at [facility] and spoke to Dr. Garvin Fila, phone number [phone] and fax number [phone]. She confirmed that no vascular testing was done over the last 5 years and she would be happy to do them if the patient did want them to be done at the [place] and we could fax him a request. Readmission: 68 year old male seen by as in February of this year and was referred to vein and vascular for studies and opinion from the vascular surgeons. The patient returns today with a fresh problem having had blisters on his left lower extremity which have been there for about 5 days and he clearly states that he has been wearing his compression stockings as advised though he could not read the moderate compression and has been wearing light compression. Review of his electronic medical records note that he had lower extremity arterial duplex examination done on 06/23/2016 which showed no hemodynamically significant stenosis in the bilateral lower extremity arterial system. He also had a lower extremity venous reflux examination done on 07/07/2016 and it was noted that he had venous incompetence in the right great saphenous vein and bilateral common femoral veins. Patient was seen by Dr. Tamala Julian on the same day and for some reason his notes do not reflect the venous studies or the arterial studies and he recommended patient do a venous duplex ultrasound to look for reflux and return to see him.he would also consider a lymph pump if required. The patient was told that his workup was normal and hence the patient canceled his follow-up appointment. 02/03/17 on evaluation today patient left medial lower extremity blister appears to be doing about the same. It is still continuing to  drain and there's still the blistered skin covering the wound bed which is making it difficult for the alternate to do its job. Fortunately there is no evidence of cellulitis. No fevers chills noted. Patient states in general he is not having any significant discomfort. Patient's lower extremity arterial duplex exam revealed that patient was hemodynamically stable with no evidence of stenosis in regard to the bilateral lower extremities. The lower extremity venous reflux exam revealed the patient had venous incontinence noted in the right greater saphenous [name], [name] ([id number]) and bilateral common femoral vein. There is no evidence of deep or superficial vein thrombosis in the bilateral lower extremities. Readmission: 11/12/18 Patient presents for evaluation our clinic today concerning issues that he is having with his left lower extremity. He tells me that a couple weeks ago he began developing blisters on the left lower extremity along with increased swelling. He typically wears his compression stockings on a regular basis is previously been evaluated both here as well is with vascular surgery they would recommend lymphedema pumps but unfortunately that somehow fell through and he never heard anything back from that. Nonetheless I think lymphedema pumps would be beneficial for this patient. He does have a history of hypertension and diabetes. Obviously the chronic venous stasis and lymphedema as well. At this point the blisters have been given in more trouble he states sometimes when the blisters openings able to clean it down with alcohol and it will dry out and do well. Unfortunately that has not been the case  this time. He is having some discomfort although this mean these with cleaning the areas he doesn't have discomfort just on a regular basis. He has not been able to wear his compression stockings since the blisters arose due to the fact that of course it will drain into the socks  causing additional issues and he didn't have any way to wrap this otherwise. He has increased to taking his Lasix every day instead of every other day. He sees his primary care provider later this month as well. No fevers, chills, nausea, or vomiting noted at this time. 11/19/18-Patient returns at 1 week, per intake RN the amount of seepage into the compression wraps was definitely improved, overall all the wounds are measuring smaller but continuing silver alginate to the wounds as primary dressing 11/26/18 on evaluation today patient appears to be doing quite well in regard to his left lower Trinity ulcers. In fact of the areas that were noted initially he only has two regions still open. There is no evidence of active infection at this time. He still is not heard anything from the company regarding lymphedema pumps as of yet. Again as previously seen vascular they have not recommended any surgical intervention. 12/03/2018 on evaluation today patient actually appears to be doing quite well with regard to his lower extremity ulcers. In fact most of the areas appear to be healed the one spot which does not seem to be completely healed I am unsure of whether or not this is really draining that much but nonetheless there does not appear to be any signs of infection or significant drainage at this point. There is no sign of fever, chills, nausea, vomiting, or diarrhea. Overall I am pleased with how things have progressed I think is very close to being able to transition to his home compression stockings. 12/10/2018 upon evaluation today patient appears to be doing quite well with regard to his left lower extremity. He has been tolerating the dressing changes without complication. Fortunately there is no signs of active infection at this time. He appears after thorough evaluation of his leg to only have 1 small area that remains open at this point everything else appears to be almost completely closed. He  still have significant swelling of the left lower extremity. We had discussed discussing this with his primary care provider he is not able to see her in person they were at the [facility] and right now the [place] is not seeing patients on site. According to the patient anyway. Subsequently he did speak with her apparently and his primary care provider feels that he may likely have a DVT. With that being said she has not seen his leg she is just going off of his history. Nonetheless that is a concern that the patient now has as well and while I do not feel the DVT is likely we can definitely ensure that that is not the case I will go ahead and see about putting that order in today. Nonetheless otherwise I am in a recommend that we continue with the current wound care measures including the compression therapy most likely. We just need to ensure that his leg is indeed free of any DVTs. 12/17/2018 on evaluation today patient actually appears to be completely healed today. He does have 2 very small areas of blistering although this is not anything too significant at this point which is good news. With that being said I am in agreement with the fact that I think he  is completely healed at this point. He does want to get back into his compression stocking. The good news is we have gotten approval from insurance for his lymphedema pumps we received a letter since last saw him last week. The other good news is his study did come back and showed no evidence of a DVT. 12/20/2018 on evaluation today patient presents for follow-up concerning his ongoing issues with his left lower extremity. He was actually discharged last Friday and did fairly well until he states blisters opened this morning. He tells me he has been wearing his compression stocking although he has a hard time getting this on. There does not appear to be any signs of active infection at this time. No fevers, chills, nausea, vomiting, or  diarrhea. Patient History Information obtained from Patient. Family History Cancer - Mother, Stroke - [name], [name] ([id number]) No family history of Diabetes, Heart Disease, Hereditary Spherocytosis, Hypertension, Kidney Disease, Lung Disease, Seizures, Thyroid Problems, Tuberculosis. Social History Former smoker - quit 5 years ago, Marital Status - Divorced, Alcohol Use - Never, Drug Use - No History, Caffeine Use - Daily. Medical History Cardiovascular Patient has history of Hypertension, Peripheral Venous Disease Denies history of Angina, Arrhythmia, Congestive Heart Failure, Coronary Artery Disease, Deep Vein Thrombosis, Hypotension, Myocardial Infarction, Peripheral Arterial Disease, Phlebitis, Vasculitis Endocrine Patient has history of Type II Diabetes Integumentary (Skin) Denies history of History of Burn, History of pressure wounds Neurologic Patient has history of Neuropathy Denies history of Dementia, Quadriplegia, Paraplegia, Seizure Disorder Oncologic Patient has history of Received Chemotherapy Denies history of Received Radiation Medical And Surgical History Notes 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 Respiratory Denies complaints or symptoms of Chronic or frequent coughs, Shortness of Breath. Cardiovascular Complains or has symptoms of LE edema. Denies complaints or symptoms of Chest pain. Psychiatric Denies complaints or symptoms of Anxiety, Claustrophobia. Objective Constitutional Well-nourished and well-hydrated in no acute distress. Vitals Time Taken: 9:05 AM, Height: 73 in, Source: Stated, Weight: 300 lbs, Source: Stated, BMI: 39.6, Temperature: 98.6 F, Pulse: 52 bpm, Respiratory Rate: 18 breaths/min, Blood Pressure: 134/54 mmHg. Respiratory normal breathing without difficulty. clear to  auscultation bilaterally. Cardiovascular [name], [name] ([id number]) regular rate and rhythm with normal S1, S2. Psychiatric this patient is able to make decisions and demonstrates good insight into disease process. Alert and Oriented x 3. pleasant and cooperative. General Notes: Patient's leg currently does not show any particular open wounds at this point he does have several blistered areas that are weeping currently. With that being said this does not appear to be too extreme and a hopefully will be able to get this under control fairly quickly. I think the big thing we need to check on her his lymphedema pumps which he still has not heard from the company regarding at this point. Other Condition(s) Patient presents with Lymphedema located on the Left Leg. Assessment Active Problems ICD-10 Type 2 diabetes mellitus with other skin ulcer Lymphedema, not elsewhere classified Venous insufficiency (chronic) (peripheral) Non-pressure chronic ulcer of other part of left lower leg with fat layer exposed Essential (primary) hypertension Procedures There was a Four Layer Compression Therapy Procedure with a pre-treatment ABI of 1.5 by Army Melia, RN. Post procedure Diagnosis Wound #: Same as Pre-Procedure Plan Primary Wound Dressing: XtraSorb Dressing Change Frequency: Change dressing every week - nurse visit for Monday in office to re-wrap Follow-up Appointments: Return  Appointment in 1 week. Edema Control: 3 Layer Compression System - Left Lower Extremity [place], [name] ([id number]) 1. I would recommend that we go ahead and initiate a 4 layer compression wrap to get the fluid and swelling under control and the patient is in agreement with the plan. 2. We will use extra sorb over the weeping areas. 3. I plan to have him come back for nurse visit on Monday to rewrap as him afraid that this is been a start sliding down as more the fluid is squeezed out of his leg. 4. I recommend still  he continue to elevate his legs as much as possible. We will also check into the lymphedema pumps and see where things stand in that regard since he really my opinion should have already heard from the company regarding this since insurance has approved it. He has not heard anything from them in the past week. We will see patient back for reevaluation in 1 week here in the clinic. If anything worsens or changes patient will contact our office for additional recommendations. Electronic Signature(s) Signed: 12/20/2018 9:52:20 AM By: Worthy Keeler PA-C Entered By: Worthy Keeler on 12/20/2018 09:52:20 [name] ([id number]) -------------------------------------------------------------------------------- ROS/PFSH Details Patient Name: [name] Date of Service: 12/20/2018 8:45 AM Medical Record Number: [id number] Patient Account Number: [id number] Date of Birth/Sex: 1951/02/03 (68 y.o. M) Treating RN: Army Melia Primary Care Provider: SYSTEM, PCP Other Clinician: Referring Provider: Referral, Self Treating Provider/Extender: STONE III, [name] Weeks in Treatment: 5 Information Obtained From Patient Constitutional Symptoms (General Health) Complaints and Symptoms: Negative for: Fatigue; Fever; Chills; Marked Weight Change Respiratory Complaints and Symptoms: Negative for: Chronic or frequent coughs; Shortness of Breath Cardiovascular Complaints and Symptoms: Positive for: LE edema Negative for: Chest pain Medical History: Positive for: Hypertension; Peripheral Venous Disease Negative for: Angina; Arrhythmia; Congestive Heart Failure; Coronary Artery Disease; Deep Vein Thrombosis; Hypotension; Myocardial Infarction; Peripheral Arterial Disease; Phlebitis; Vasculitis Psychiatric Complaints and Symptoms: Negative for: Anxiety; Claustrophobia Endocrine Medical History: Positive for: Type II Diabetes Treated with: Oral agents Integumentary (Skin) Medical History: Negative for:  History of Burn; History of pressure wounds Neurologic Medical History: Positive for: Neuropathy Negative for: Dementia; Quadriplegia; Paraplegia; Seizure Disorder Oncologic Medical History: Positive for: Received Chemotherapy [name], [name] ([id number]) Negative for: Received Radiation Past Medical History Notes: 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 Immunizations Pneumococcal Vaccine: Received Pneumococcal Vaccination: Yes Immunization Notes: up to date Implantable Devices None Family and Social History Cancer: Yes - Mother; Diabetes: No; Heart Disease: No; Hereditary Spherocytosis: No; Hypertension: No; Kidney Disease: No; Lung Disease: No; Seizures: No; Stroke: Yes - Father; Thyroid Problems: No; Tuberculosis: No; Former smoker - quit 5 years ago; Marital Status - Divorced; Alcohol Use: Never; Drug Use: No History; Caffeine Use: Daily; Financial Concerns: No; Food, Clothing or Shelter Needs: No; Support System Lacking: No; Transportation Concerns: No Physician Affirmation I have reviewed and agree with the above information. Electronic Signature(s) Signed: 12/20/2018 11:56:24 AM By: Army Melia Signed: 12/20/2018 10:42:11 PM By: Worthy Keeler PA-C Entered By: Worthy Keeler on 12/20/2018 09:51:17 [name] ([id number]) -------------------------------------------------------------------------------- SuperBill Details Patient Name: [name] Date of Service: 12/20/2018 Medical Record Number: [id number] Patient Account Number: [id number] Date of Birth/Sex: 14-Jan-1951 (68 y.o. M) Treating RN: Army Melia Primary Care Provider: SYSTEM, PCP Other Clinician: Referring Provider: Referral, Self Treating Provider/Extender: STONE III, [name] Weeks in Treatment: 5 Diagnosis Coding ICD-10 Codes Code Description E11.622 Type 2 diabetes mellitus with other skin ulcer I89.0 Lymphedema, not elsewhere classified I87.2 Venous  insufficiency (chronic) (peripheral) L97.822 Non-pressure  chronic ulcer of other part of left lower leg with fat layer exposed I10 Essential (primary) hypertension Facility Procedures CPT4 Code: 88677373 Description: (Facility Use Only) [phone] - APPLY [place] LWR LT LEG Modifier: Quantity: 1 Physician Procedures CPT4 Code Description: [id number] - WC PHYS LEVEL 4 - EST PT ICD-10 Diagnosis Description E11.622 Type 2 diabetes mellitus with other skin ulcer I89.0 Lymphedema, not elsewhere classified I87.2 Venous insufficiency (chronic) (peripheral) L97.822  Non-pressure chronic ulcer of other part of left lower leg wit Modifier: h fat layer expos Quantity: 1 ed Electronic Signature(s) Signed: 12/20/2018 9:52:32 AM By: Worthy Keeler PA-C Entered By: Worthy Keeler on 12/20/2018 09:52:32

## 2018-12-20 NOTE — Progress Notes (Signed)
[name] ([id number]) Visit Report for 12/20/2018 Arrival Information Details Patient Name: [name], [name] Date of Service: 12/20/2018 8:45 AM Medical Record Number: [id number] Patient Account Number: [id number] Date of Birth/Sex: [birth year] (68 y.o. M) Treating RN: Harold Barban Primary Care Federick Levene: SYSTEM, PCP Other Clinician: Referring Rasheeda Mulvehill: Referral, Self Treating Vallerie Hentz/Extender: STONE III, [name] Weeks in Treatment: 5 Visit Information History Since Last Visit Added or deleted any medications: No Patient Arrived: Ambulatory Any new allergies or adverse reactions: No Arrival Time: 09:04 Had a fall or experienced change in No Accompanied By: self activities of daily living that may affect Transfer Assistance: None risk of falls: Patient Identification Verified: Yes Signs or symptoms of abuse/neglect since last visito No Secondary Verification Process Completed: Yes Hospitalized since last visit: No Patient Has Alerts: Yes Has Dressing in Place as Prescribed: No Patient Alerts: DMII Has Compression in Place as Prescribed: No Pain Present Now: No Electronic Signature(s) Signed: 12/20/2018 2:03:15 PM By: Harold Barban Entered By: Harold Barban on 12/20/2018 09:04:43 [name] ([id number]) -------------------------------------------------------------------------------- Compression Therapy Details Patient Name: [name] Date of Service: 12/20/2018 8:45 AM Medical Record Number: [id number] Patient Account Number: [id number] Date of Birth/Sex: 19-Nov-1950 (68 y.o. M) Treating RN: Army Melia Primary Care Patra Gherardi: SYSTEM, PCP Other Clinician: Referring Kayron Kalmar: Referral, Self Treating Aida Lemaire/Extender: STONE III, [name] Weeks in Treatment: 5 Compression Therapy Performed for Wound Assessment: NonWound Condition Lymphedema - Left Leg Performed By: Clinician Army Melia, RN Compression Type: Four Layer Pre Treatment ABI: 1.5 Post Procedure Diagnosis Same  as Pre-procedure Electronic Signature(s) Signed: 12/20/2018 11:56:24 AM By: Army Melia Entered By: Army Melia on 12/20/2018 09:47:57 [name] ([id number]) -------------------------------------------------------------------------------- Lower Extremity Assessment Details Patient Name: [name] Date of Service: 12/20/2018 8:45 AM Medical Record Number: [id number] Patient Account Number: [id number] Date of Birth/Sex: 1951/01/08 (68 y.o. M) Treating RN: Harold Barban Primary Care Graig Hessling: SYSTEM, PCP Other Clinician: Referring Carole Deere: Referral, Self Treating Brinn Westby/Extender: STONE III, [name] Weeks in Treatment: 5 Edema Assessment Assessed: [Left: No] [Right: No] [Left: Edema] [Right: :] Calf Left: Right: Point of Measurement: 37 cm From Medial Instep 45 cm cm Ankle Left: Right: Point of Measurement: 13 cm From Medial Instep 33.5 cm cm Vascular Assessment Pulses: Dorsalis Pedis Palpable: [Left:Yes] Posterior Tibial Palpable: [Left:Yes] Electronic Signature(s) Signed: 12/20/2018 2:03:15 PM By: Harold Barban Entered By: Harold Barban on 12/20/2018 09:11:39 [name] ([id number]) -------------------------------------------------------------------------------- Multi Wound Chart Details Patient Name: [name] Date of Service: 12/20/2018 8:45 AM Medical Record Number: [id number] Patient Account Number: [id number] Date of Birth/Sex: June 16, 1950 (68 y.o. M) Treating RN: Army Melia Primary Care Denice Cardon: SYSTEM, PCP Other Clinician: Referring Baylyn Sickles: Referral, Self Treating Jahid Weida/Extender: STONE III, [name] Weeks in Treatment: 5 Vital Signs Height(in): 73 Pulse(bpm): 52 Weight(lbs): 300 Blood Pressure(mmHg): 134/54 Body Mass Index(BMI): 40 Temperature(F): 98.6 Respiratory Rate 18 (breaths/min): Wound Assessments Treatment Notes Electronic Signature(s) Signed: 12/20/2018 11:56:24 AM By: Army Melia Entered By: Army Melia on 12/20/2018  09:41:19 [name] ([id number]) -------------------------------------------------------------------------------- Non-Wound Condition Assessment Details Patient Name: [name] Date of Service: 12/20/2018 8:45 AM Medical Record Number: [id number] Patient Account Number: [id number] Date of Birth/Sex: 1950/11/12 (68 y.o. M) Treating RN: Harold Barban Primary Care Elease Swarm: SYSTEM, PCP Other Clinician: Referring Lyndsee Casa: Referral, Self Treating Pallie Swigert/Extender: STONE III, [name] Weeks in Treatment: 5 Non-Wound Condition: Condition: Lymphedema Location: Leg Side: Left Notes: Anterior aspect of lower leg Photos Electronic Signature(s) Signed: 12/20/2018 2:03:15 PM By: Harold Barban Entered By: Harold Barban on 12/20/2018 09:10:36 [name] ([id number]) -------------------------------------------------------------------------------- Pain Assessment Details Patient Name: [name] Date of  Service: 12/20/2018 8:45 AM Medical Record Number: [id number] Patient Account Number: [id number] Date of Birth/Sex: July 01, 1950 (68 y.o. M) Treating RN: Harold Barban Primary Care Colm Lyford: SYSTEM, PCP Other Clinician: Referring Jilene Spohr: Referral, Self Treating Fabienne Nolasco/Extender: STONE III, [name] Weeks in Treatment: 5 Active Problems Location of Pain Severity and Description of Pain Patient Has Paino No Site Locations Pain Management and Medication Current Pain Management: Electronic Signature(s) Signed: 12/20/2018 2:03:15 PM By: Harold Barban Entered By: Harold Barban on 12/20/2018 09:05:41 [name] ([id number]) -------------------------------------------------------------------------------- Patient/Caregiver Education Details Patient Name: [name] Date of Service: 12/20/2018 8:45 AM Medical Record Number: [id number] Patient Account Number: [id number] Date of Birth/Gender: 04-Sep-1950 (68 y.o. M) Treating RN: Army Melia Primary Care Physician: SYSTEM, PCP Other  Clinician: Referring Physician: Referral, Self Treating Physician/Extender: Melburn Hake, [name] Weeks in Treatment: 5 Education Assessment Education Provided To: Patient Education Topics Provided Wound/Skin Impairment: Handouts: Caring for Your Ulcer Methods: Demonstration, Explain/Verbal Responses: State content correctly Electronic Signature(s) Signed: 12/20/2018 11:56:24 AM By: Army Melia Entered By: Army Melia on 12/20/2018 09:51:04 [name] ([id number]) -------------------------------------------------------------------------------- Vitals Details Patient Name: [name] Date of Service: 12/20/2018 8:45 AM Medical Record Number: [id number] Patient Account Number: [id number] Date of Birth/Sex: 18-Sep-1950 (68 y.o. M) Treating RN: Harold Barban Primary Care Shomari Matusik: SYSTEM, PCP Other Clinician: Referring Sylas Twombly: Referral, Self Treating Gohan Collister/Extender: STONE III, [name] Weeks in Treatment: 5 Vital Signs Time Taken: 09:05 Temperature (F): 98.6 Height (in): 73 Pulse (bpm): 52 Source: Stated Respiratory Rate (breaths/min): 18 Weight (lbs): 300 Blood Pressure (mmHg): 134/54 Source: Stated Reference Range: 80 - 120 mg / dl Body Mass Index (BMI): 39.6 Electronic Signature(s) Signed: 12/20/2018 2:03:15 PM By: Harold Barban Entered By: Harold Barban on 12/20/2018 09:07:27

## 2018-12-24 ENCOUNTER — Other Ambulatory Visit: Payer: Self-pay

## 2018-12-24 DIAGNOSIS — E11622 Type 2 diabetes mellitus with other skin ulcer: Secondary | ICD-10-CM | POA: Diagnosis not present

## 2018-12-24 NOTE — Progress Notes (Signed)
[facility], [facility] ([id number]) Visit Report for 12/24/2018 Arrival Information Details Patient Name: CORDEL, DREWES Date of Service: 12/24/2018 1:30 PM Medical Record Number: [id number] Patient Account Number: [id number] Date of Birth/Sex: 1950/12/16 (68 y.o. M) Treating RN: Montey Hora Primary Care Amyia Lodwick: SYSTEM, PCP Other Clinician: Referring Oda Placke: Referral, Self Treating My Rinke/Extender: STONE III, [name] Weeks in Treatment: 6 Visit Information History Since Last Visit Added or deleted any medications: No Patient Arrived: Ambulatory Any new allergies or adverse reactions: No Arrival Time: 13:42 Had a fall or experienced change in No Accompanied By: self activities of daily living that may affect Transfer Assistance: None risk of falls: Patient Identification Verified: Yes Signs or symptoms of abuse/neglect since last visito No Secondary Verification Process Completed: Yes Hospitalized since last visit: No Patient Has Alerts: Yes Implantable device outside of the clinic excluding No Patient Alerts: DMII cellular tissue based products placed in the center since last visit: Has Dressing in Place as Prescribed: Yes Has Compression in Place as Prescribed: Yes Pain Present Now: No Electronic Signature(s) Signed: 12/24/2018 1:43:16 PM By: Montey Hora Entered By: Montey Hora on 12/24/2018 13:43:16 [name] ([id number]) -------------------------------------------------------------------------------- Compression Therapy Details Patient Name: [name] Date of Service: 12/24/2018 1:30 PM Medical Record Number: [id number] Patient Account Number: [id number] Date of Birth/Sex: 07/15/1950 (68 y.o. M) Treating RN: Montey Hora Primary Care Raysha Tilmon: SYSTEM, PCP Other Clinician: Referring Geraldine Tesar: Referral, Self Treating Adrien Dietzman/Extender: STONE III, [name] Weeks in Treatment: 6 Compression Therapy Performed for Wound Assessment: NonWound Condition Lymphedema - Left  Leg Performed By: Clinician Montey Hora, RN Compression Type: Four Layer Pre Treatment ABI: 1.5 Electronic Signature(s) Signed: 12/24/2018 1:43:39 PM By: Montey Hora Entered By: Montey Hora on 12/24/2018 13:43:38 [name] ([id number]) -------------------------------------------------------------------------------- Encounter Discharge Information Details Patient Name: [name] Date of Service: 12/24/2018 1:30 PM Medical Record Number: [id number] Patient Account Number: [id number] Date of Birth/Sex: [birth year] (68 y.o. M) Treating RN: Montey Hora Primary Care Mouna Yager: SYSTEM, PCP Other Clinician: Referring Juli Odom: Referral, Self Treating Hana Trippett/Extender: Melburn Hake, [name] Weeks in Treatment: 6 Encounter Discharge Information Items Discharge Condition: Stable Ambulatory Status: Ambulatory Discharge Destination: Home Transportation: Private Auto Accompanied By: self Schedule Follow-up Appointment: Yes Clinical Summary of Care: Electronic Signature(s) Signed: 12/24/2018 1:44:37 PM By: Montey Hora Entered By: Montey Hora on 12/24/2018 13:44:37

## 2018-12-26 DIAGNOSIS — I890 Lymphedema, not elsewhere classified: Secondary | ICD-10-CM | POA: Diagnosis not present

## 2018-12-27 ENCOUNTER — Other Ambulatory Visit: Payer: Self-pay

## 2018-12-27 ENCOUNTER — Encounter: Payer: PPO | Admitting: Physician Assistant

## 2018-12-27 DIAGNOSIS — L97822 Non-pressure chronic ulcer of other part of left lower leg with fat layer exposed: Secondary | ICD-10-CM | POA: Diagnosis not present

## 2018-12-27 DIAGNOSIS — E11622 Type 2 diabetes mellitus with other skin ulcer: Secondary | ICD-10-CM | POA: Diagnosis not present

## 2018-12-27 DIAGNOSIS — I890 Lymphedema, not elsewhere classified: Secondary | ICD-10-CM | POA: Diagnosis not present

## 2018-12-27 NOTE — Progress Notes (Addendum)
Mooney, [name] ([id number]) Visit Report for 12/27/2018 Chief Complaint Document Details Patient Name: [name], [name] Date of Service: 12/27/2018 9:00 AM Medical Record Number: [id number] Patient Account Number: [id number] Date of Birth/Sex: Jun 13, 1950 (68 y.o. M) Treating RN: Army Melia Primary Care Provider: SYSTEM, PCP Other Clinician: Referring Provider: Referral, Self Treating Provider/Extender: STONE III, Ousman Dise Weeks in Treatment: 6 Information Obtained from: Patient Chief Complaint Left LE ulcers Electronic Signature(s) Signed: 12/27/2018 9:10:41 AM By: Worthy Keeler PA-C Entered By: Worthy Keeler on 12/27/2018 09:10:41 [name] ([id number]) -------------------------------------------------------------------------------- HPI Details Patient Name: [name] Date of Service: 12/27/2018 9:00 AM Medical Record Number: [id number] Patient Account Number: [id number] Date of Birth/Sex: 04-24-1951 (68 y.o. M) Treating RN: Army Melia Primary Care Provider: SYSTEM, PCP Other Clinician: Referring Provider: Referral, Self Treating Provider/Extender: STONE III, Nalaya Wojdyla Weeks in Treatment: 6 History of Present Illness HPI Description: 68 year old male who presented to the ER with bilateral lower extremity blisters which had started last week. he has a past medical history of leukemia, diabetes mellitus, hypertension, edema of both lower extremities, his recurrent skin infections, peripheral vascular disease, coronary artery disease, congestive heart failure and peripheral neuropathy. in the ER he was given Rocephin and put on Silvadene cream. he was put on oral doxycycline and was asked to follow-up with the [facility]. His last hemoglobin A1c was 6.6 in December and he checks his blood sugar once a week. He does not have any physicians outside the [place] system. He does not recall any vascular duplex studies done either for arterial or venous disease but was told to wear  compression stockings which he does not use 05/30/2016 -- we have not yet received any of his notes from the [facility] hospital system and his arterial and venous duplex studies are scheduled here in [place] around mid February. We are unable to have his insurance accepted by home health agencies and hence he is getting dressings only once a week. 06/06/16 -- -- I received a call from the patient's PCP at the [facility] at [facility] and spoke to Dr. Garvin Fila, phone number [phone] and fax number [phone]. She confirmed that no vascular testing was done over the last 5 years and she would be happy to do them if the patient did want them to be done at the [place] and we could fax him a request. Readmission: 68 year old male seen by as in February of this year and was referred to vein and vascular for studies and opinion from the vascular surgeons. The patient returns today with a fresh problem having had blisters on his left lower extremity which have been there for about 5 days and he clearly states that he has been wearing his compression stockings as advised though he could not read the moderate compression and has been wearing light compression. Review of his electronic medical records note that he had lower extremity arterial duplex examination done on 06/23/2016 which showed no hemodynamically significant stenosis in the bilateral lower extremity arterial system. He also had a lower extremity venous reflux examination done on 07/07/2016 and it was noted that he had venous incompetence in the right great saphenous vein and bilateral common femoral veins. Patient was seen by Dr. Tamala Julian on the same day and for some reason his notes do not reflect the venous studies or the arterial studies and he recommended patient do a venous duplex ultrasound to look for reflux and return to see him.he would also consider a lymph pump if required. The patient was told that  his workup was normal and hence the  patient canceled his follow-up appointment. 02/03/17 on evaluation today patient left medial lower extremity blister appears to be doing about the same. It is still continuing to drain and there's still the blistered skin covering the wound bed which is making it difficult for the alternate to do its job. Fortunately there is no evidence of cellulitis. No fevers chills noted. Patient states in general he is not having any significant discomfort. Patient's lower extremity arterial duplex exam revealed that patient was hemodynamically stable with no evidence of stenosis in regard to the bilateral lower extremities. The lower extremity venous reflux exam revealed the patient had venous incontinence noted in the right greater saphenous and bilateral common femoral vein. There is no evidence of deep or superficial vein thrombosis in the bilateral lower extremities. Readmission: [name], Mooney ([id number]) 11/12/18 Patient presents for evaluation our clinic today concerning issues that he is having with his left lower extremity. He tells me that a couple weeks ago he began developing blisters on the left lower extremity along with increased swelling. He typically wears his compression stockings on a regular basis is previously been evaluated both here as well is with vascular surgery they would recommend lymphedema pumps but unfortunately that somehow fell through and he never heard anything back from that. Nonetheless I think lymphedema pumps would be beneficial for this patient. He does have a history of hypertension and diabetes. Obviously the chronic venous stasis and lymphedema as well. At this point the blisters have been given in more trouble he states sometimes when the blisters openings able to clean it down with alcohol and it will dry out and do well. Unfortunately that has not been the case this time. He is having some discomfort although this mean these with cleaning the areas he doesn't have  discomfort just on a regular basis. He has not been able to wear his compression stockings since the blisters arose due to the fact that of course it will drain into the socks causing additional issues and he didn't have any way to wrap this otherwise. He has increased to taking his Lasix every day instead of every other day. He sees his primary care provider later this month as well. No fevers, chills, nausea, or vomiting noted at this time. 11/19/18-Patient returns at 1 week, per intake RN the amount of seepage into the compression wraps was definitely improved, overall all the wounds are measuring smaller but continuing silver alginate to the wounds as primary dressing 11/26/18 on evaluation today patient appears to be doing quite well in regard to his left lower Trinity ulcers. In fact of the areas that were noted initially he only has two regions still open. There is no evidence of active infection at this time. He still is not heard anything from the company regarding lymphedema pumps as of yet. Again as previously seen vascular they have not recommended any surgical intervention. 12/03/2018 on evaluation today patient actually appears to be doing quite well with regard to his lower extremity ulcers. In fact most of the areas appear to be healed the one spot which does not seem to be completely healed I am unsure of whether or not this is really draining that much but nonetheless there does not appear to be any signs of infection or significant drainage at this point. There is no sign of fever, chills, nausea, vomiting, or diarrhea. Overall I am pleased with how things have progressed I think is very close  to being able to transition to his home compression stockings. 12/10/2018 upon evaluation today patient appears to be doing quite well with regard to his left lower extremity. He has been tolerating the dressing changes without complication. Fortunately there is no signs of active infection at  this time. He appears after thorough evaluation of his leg to only have 1 small area that remains open at this point everything else appears to be almost completely closed. He still have significant swelling of the left lower extremity. We had discussed discussing this with his primary care provider he is not able to see her in person they were at the [facility] and right now the [place] is not seeing patients on site. According to the patient anyway. Subsequently he did speak with her apparently and his primary care provider feels that he may likely have a DVT. With that being said she has not seen his leg she is just going off of his history. Nonetheless that is a concern that the patient now has as well and while I do not feel the DVT is likely we can definitely ensure that that is not the case I will go ahead and see about putting that order in today. Nonetheless otherwise I am in a recommend that we continue with the current wound care measures including the compression therapy most likely. We just need to ensure that his leg is indeed free of any DVTs. 12/17/2018 on evaluation today patient actually appears to be completely healed today. He does have 2 very small areas of blistering although this is not anything too significant at this point which is good news. With that being said I am in agreement with the fact that I think he is completely healed at this point. He does want to get back into his compression stocking. The good news is we have gotten approval from insurance for his lymphedema pumps we received a letter since last saw him last week. The other good news is his study did come back and showed no evidence of a DVT. 12/20/2018 on evaluation today patient presents for follow-up concerning his ongoing issues with his left lower extremity. He was actually discharged last Friday and did fairly well until he states blisters opened this morning. He tells me he has been wearing his compression  stocking although he has a hard time getting this on. There does not appear to be any signs of active infection at this time. No fevers, chills, nausea, vomiting, or diarrhea. 12/27/2018 on evaluation today patient appears to be doing very well with regard to his swelling of the left lower extremity the 4 layer compression wrap seems to have been beneficial for him. Fortunately there is no signs of active infection at this time. Patient has been tolerating the compression wrap without complication and his foot swelling in particular appears to be greatly improved. He does still have a wound on the lateral portion of his left leg I believe this is more of a blister that has now reopened. Electronic Signature(s) Signed: 12/27/2018 9:56:10 AM By: Worthy Keeler PA-C Entered By: Worthy Keeler on 12/27/2018 09:56:09 ROEY, COOPMAN ([id number]) JIOVANY, SCHEFFEL ([id number]) -------------------------------------------------------------------------------- Physical Exam Details Patient Name: STEADMAN, PROSPERI Date of Service: 12/27/2018 9:00 AM Medical Record Number: [id number] Patient Account Number: [id number] Date of Birth/Sex: 1950/06/18 (68 y.o. M) Treating RN: Army Melia Primary Care Provider: SYSTEM, PCP Other Clinician: Referring Provider: Referral, Self Treating Provider/Extender: STONE III, Othelia Riederer Weeks in Treatment: 6 Constitutional Well-nourished and well-hydrated  in no acute distress. Respiratory normal breathing without difficulty. clear to auscultation bilaterally. Cardiovascular regular rate and rhythm with normal S1, S2. Psychiatric this patient is able to make decisions and demonstrates good insight into disease process. Alert and Oriented x 3. pleasant and cooperative. Notes Upon inspection today patient's wound on the left lateral lower extremity appears to be very superficial and there is some new skin growth noted at this point. Fortunately there is no evidence of active infection  which is good news. Overall I feel like his swelling is significantly improved hopefully will be even better so once he gets the lymphedema pumps they should be arriving either tomorrow or Monday. Electronic Signature(s) Signed: 12/27/2018 9:58:19 AM By: Worthy Keeler PA-C Entered By: Worthy Keeler on 12/27/2018 09:58:18 [name] ([id number]) -------------------------------------------------------------------------------- Physician Orders Details Patient Name: [name] Date of Service: 12/27/2018 9:00 AM Medical Record Number: [id number] Patient Account Number: [id number] Date of Birth/Sex: [birth year] (68 y.o. M) Treating RN: Army Melia Primary Care Provider: SYSTEM, PCP Other Clinician: Referring Provider: Referral, Self Treating Provider/Extender: STONE III, Ishaaq Penna Weeks in Treatment: 6 Verbal / Phone Orders: No Diagnosis Coding ICD-10 Coding Code Description E11.622 Type 2 diabetes mellitus with other skin ulcer I89.0 Lymphedema, not elsewhere classified I87.2 Venous insufficiency (chronic) (peripheral) L97.822 Non-pressure chronic ulcer of other part of left lower leg with fat layer exposed I10 Essential (primary) hypertension Primary Wound Dressing Wound #6R Left,Lateral Lower Leg o ABD Pad o Silver Collagen Dressing Change Frequency Wound #6R Left,Lateral Lower Leg o Change dressing every week - nurse visit for Monday in office to re-wrap Follow-up Appointments Wound #6R Left,Lateral Lower Leg o Return Appointment in 1 week. Edema Control Wound #6R Left,Lateral Lower Leg o 4-Layer Compression System - Left Lower Extremity. Electronic Signature(s) Signed: 12/27/2018 11:51:11 AM By: Army Melia Signed: 12/27/2018 5:39:21 PM By: Worthy Keeler PA-C Entered By: Army Melia on 12/27/2018 09:32:13 [name] ([id number]) -------------------------------------------------------------------------------- Problem List Details Patient Name: [name] Date of Service: 12/27/2018 9:00 AM Medical Record Number: [id number] Patient Account Number: [id number] Date of Birth/Sex: [birth year] (68 y.o. M) Treating RN: Army Melia Primary Care Provider: SYSTEM, PCP Other Clinician: Referring Provider: Referral, Self Treating Provider/Extender: Melburn Hake, Kue Fox Weeks in Treatment: 6 Active Problems ICD-10 Evaluated Encounter Code Description Active Date Today Diagnosis E11.622 Type 2 diabetes mellitus with other skin ulcer 11/12/2018 No Yes I89.0 Lymphedema, not elsewhere classified 11/12/2018 No Yes I87.2 Venous insufficiency (chronic) (peripheral) 11/12/2018 No Yes L97.822 Non-pressure chronic ulcer of other part of left lower leg with 11/12/2018 No Yes fat layer exposed I10 Essential (primary) hypertension 11/12/2018 No Yes Inactive Problems Resolved Problems Electronic Signature(s) Signed: 12/27/2018 9:10:35 AM By: Worthy Keeler PA-C Entered By: Worthy Keeler on 12/27/2018 09:10:34 [name] ([id number]) -------------------------------------------------------------------------------- Progress Note Details Patient Name: [name] Date of Service: 12/27/2018 9:00 AM Medical Record Number: [id number] Patient Account Number: [id number] Date of Birth/Sex: Oct 11, 1950 (68 y.o. M) Treating RN: Army Melia Primary Care Provider: SYSTEM, PCP Other Clinician: Referring Provider: Referral, Self Treating Provider/Extender: STONE III, Alayne Estrella Weeks in Treatment: 6 Subjective Chief Complaint Information obtained from Patient Left LE ulcers History of Present Illness (HPI) 68 year old male who presented to the ER with bilateral lower extremity blisters which had started last week. he has a past medical history of leukemia, diabetes mellitus, hypertension, edema of both lower extremities, his recurrent skin infections, peripheral vascular disease, coronary artery disease, congestive heart failure and peripheral neuropathy. in the ER he was given  Rocephin  and put on Silvadene cream. he was put on oral doxycycline and was asked to follow-up with the [facility]. His last hemoglobin A1c was 6.6 in December and he checks his blood sugar once a week. He does not have any physicians outside the [place] system. He does not recall any vascular duplex studies done either for arterial or venous disease but was told to wear compression stockings which he does not use 05/30/2016 -- we have not yet received any of his notes from the [facility] hospital system and his arterial and venous duplex studies are scheduled here in [place] around mid February. We are unable to have his insurance accepted by home health agencies and hence he is getting dressings only once a week. 06/06/16 -- -- I received a call from the patient's PCP at the [facility] at [facility] and spoke to Dr. Garvin Fila, phone number [phone] and fax number [phone]. She confirmed that no vascular testing was done over the last 5 years and she would be happy to do them if the patient did want them to be done at the [place] and we could fax him a request. Readmission: 68 year old male seen by as in February of this year and was referred to vein and vascular for studies and opinion from the vascular surgeons. The patient returns today with a fresh problem having had blisters on his left lower extremity which have been there for about 5 days and he clearly states that he has been wearing his compression stockings as advised though he could not read the moderate compression and has been wearing light compression. Review of his electronic medical records note that he had lower extremity arterial duplex examination done on 06/23/2016 which showed no hemodynamically significant stenosis in the bilateral lower extremity arterial system. He also had a lower extremity venous reflux examination done on 07/07/2016 and it was noted that he had venous incompetence in the right great saphenous vein and bilateral  common femoral veins. Patient was seen by Dr. Tamala Julian on the same day and for some reason his notes do not reflect the venous studies or the arterial studies and he recommended patient do a venous duplex ultrasound to look for reflux and return to see him.he would also consider a lymph pump if required. The patient was told that his workup was normal and hence the patient canceled his follow-up appointment. 02/03/17 on evaluation today patient left medial lower extremity blister appears to be doing about the same. It is still continuing to drain and there's still the blistered skin covering the wound bed which is making it difficult for the alternate to do its job. Fortunately there is no evidence of cellulitis. No fevers chills noted. Patient states in general he is not having any significant discomfort. Patient's lower extremity arterial duplex exam revealed that patient was hemodynamically stable with no evidence of stenosis in regard to the bilateral lower extremities. The lower extremity venous reflux exam revealed the patient had venous incontinence noted in the right greater saphenous [place], [name] ([id number]) and bilateral common femoral vein. There is no evidence of deep or superficial vein thrombosis in the bilateral lower extremities. Readmission: 11/12/18 Patient presents for evaluation our clinic today concerning issues that he is having with his left lower extremity. He tells me that a couple weeks ago he began developing blisters on the left lower extremity along with increased swelling. He typically wears his compression stockings on a regular basis is previously been evaluated both here as well is with  vascular surgery they would recommend lymphedema pumps but unfortunately that somehow fell through and he never heard anything back from that. Nonetheless I think lymphedema pumps would be beneficial for this patient. He does have a history of hypertension and diabetes. Obviously the  chronic venous stasis and lymphedema as well. At this point the blisters have been given in more trouble he states sometimes when the blisters openings able to clean it down with alcohol and it will dry out and do well. Unfortunately that has not been the case this time. He is having some discomfort although this mean these with cleaning the areas he doesn't have discomfort just on a regular basis. He has not been able to wear his compression stockings since the blisters arose due to the fact that of course it will drain into the socks causing additional issues and he didn't have any way to wrap this otherwise. He has increased to taking his Lasix every day instead of every other day. He sees his primary care provider later this month as well. No fevers, chills, nausea, or vomiting noted at this time. 11/19/18-Patient returns at 1 week, per intake RN the amount of seepage into the compression wraps was definitely improved, overall all the wounds are measuring smaller but continuing silver alginate to the wounds as primary dressing 11/26/18 on evaluation today patient appears to be doing quite well in regard to his left lower Trinity ulcers. In fact of the areas that were noted initially he only has two regions still open. There is no evidence of active infection at this time. He still is not heard anything from the company regarding lymphedema pumps as of yet. Again as previously seen vascular they have not recommended any surgical intervention. 12/03/2018 on evaluation today patient actually appears to be doing quite well with regard to his lower extremity ulcers. In fact most of the areas appear to be healed the one spot which does not seem to be completely healed I am unsure of whether or not this is really draining that much but nonetheless there does not appear to be any signs of infection or significant drainage at this point. There is no sign of fever, chills, nausea, vomiting, or diarrhea.  Overall I am pleased with how things have progressed I think is very close to being able to transition to his home compression stockings. 12/10/2018 upon evaluation today patient appears to be doing quite well with regard to his left lower extremity. He has been tolerating the dressing changes without complication. Fortunately there is no signs of active infection at this time. He appears after thorough evaluation of his leg to only have 1 small area that remains open at this point everything else appears to be almost completely closed. He still have significant swelling of the left lower extremity. We had discussed discussing this with his primary care provider he is not able to see her in person they were at the [facility] and right now the [place] is not seeing patients on site. According to the patient anyway. Subsequently he did speak with her apparently and his primary care provider feels that he may likely have a DVT. With that being said she has not seen his leg she is just going off of his history. Nonetheless that is a concern that the patient now has as well and while I do not feel the DVT is likely we can definitely ensure that that is not the case I will go ahead and see about putting  that order in today. Nonetheless otherwise I am in a recommend that we continue with the current wound care measures including the compression therapy most likely. We just need to ensure that his leg is indeed free of any DVTs. 12/17/2018 on evaluation today patient actually appears to be completely healed today. He does have 2 very small areas of blistering although this is not anything too significant at this point which is good news. With that being said I am in agreement with the fact that I think he is completely healed at this point. He does want to get back into his compression stocking. The good news is we have gotten approval from insurance for his lymphedema pumps we received a letter since last saw him  last week. The other good news is his study did come back and showed no evidence of a DVT. 12/20/2018 on evaluation today patient presents for follow-up concerning his ongoing issues with his left lower extremity. He was actually discharged last Friday and did fairly well until he states blisters opened this morning. He tells me he has been wearing his compression stocking although he has a hard time getting this on. There does not appear to be any signs of active infection at this time. No fevers, chills, nausea, vomiting, or diarrhea. 12/27/2018 on evaluation today patient appears to be doing very well with regard to his swelling of the left lower extremity the 4 layer compression wrap seems to have been beneficial for him. Fortunately there is no signs of active infection at this time. Patient has been tolerating the compression wrap without complication and his foot swelling in particular appears to be greatly improved. He does still have a wound on the lateral portion of his left leg I believe this is more of a blister that has now reopened. GARRY, NICOLINI ([id number]) Patient History Information obtained from Patient. Family History Cancer - Mother, Stroke - Father, No family history of Diabetes, Heart Disease, Hereditary Spherocytosis, Hypertension, Kidney Disease, Lung Disease, Seizures, Thyroid Problems, Tuberculosis. Social History Former smoker - quit 5 years ago, Marital Status - Divorced, Alcohol Use - Never, Drug Use - No History, Caffeine Use - Daily. Medical History Cardiovascular Patient has history of Hypertension, Peripheral Venous Disease Denies history of Angina, Arrhythmia, Congestive Heart Failure, Coronary Artery Disease, Deep Vein Thrombosis, Hypotension, Myocardial Infarction, Peripheral Arterial Disease, Phlebitis, Vasculitis Endocrine Patient has history of Type II Diabetes Integumentary (Skin) Denies history of History of Burn, History of pressure  wounds Neurologic Patient has history of Neuropathy Denies history of Dementia, Quadriplegia, Paraplegia, Seizure Disorder Oncologic Patient has history of Received Chemotherapy Denies history of Received Radiation Medical And Surgical History Notes 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 Respiratory Denies complaints or symptoms of Chronic or frequent coughs, Shortness of Breath. Cardiovascular Complains or has symptoms of LE edema. Denies complaints or symptoms of Chest pain. Psychiatric Denies complaints or symptoms of Anxiety, Claustrophobia. Objective Constitutional Well-nourished and well-hydrated in no acute distress. Vitals Time Taken: 9:07 AM, Height: 73 in, Weight: 300 lbs, BMI: 39.6, Temperature: 98.8 F, Pulse: 57 bpm, Respiratory Rate: 18 breaths/min, Blood Pressure: 129/56 mmHg. [place], [name] ([id number]) Respiratory normal breathing without difficulty. clear to auscultation bilaterally. Cardiovascular regular rate and rhythm with normal S1, S2. Psychiatric this patient is able to make decisions and demonstrates good insight into disease process. Alert and Oriented x 3. pleasant and cooperative.  General Notes: Upon inspection today patient's wound on the left lateral lower extremity appears to be very superficial and there is some new skin growth noted at this point. Fortunately there is no evidence of active infection which is good news. Overall I feel like his swelling is significantly improved hopefully will be even better so once he gets the lymphedema pumps they should be arriving either tomorrow or Monday. Integumentary (Hair, Skin) Wound #6R status is Open. Original cause of wound was Blister. The wound is located on the Left,Lateral Lower Leg. The wound measures 6.2cm length x 3.5cm width x 0.1cm depth;  17.043cm^2 area and 1.704cm^3 volume. There is Fat Layer (Subcutaneous Tissue) Exposed exposed. There is no tunneling or undermining noted. There is large (67-100%) red granulation within the wound bed. There is no necrotic tissue within the wound bed. Other Condition(s) Patient presents with Lymphedema located on the Left Leg. Assessment Active Problems ICD-10 Type 2 diabetes mellitus with other skin ulcer Lymphedema, not elsewhere classified Venous insufficiency (chronic) (peripheral) Non-pressure chronic ulcer of other part of left lower leg with fat layer exposed Essential (primary) hypertension Procedures Wound #6R Pre-procedure diagnosis of Wound #6R is a Diabetic Wound/Ulcer of the Lower Extremity located on the Left,Lateral Lower Leg . There was a Four Layer Compression Therapy Procedure with a pre-treatment ABI of 1.5 by Army Melia, RN. Post procedure Diagnosis Wound #6R: Same as Pre-Procedure DOLORES, MCGOVERN ([id number]) Plan Primary Wound Dressing: Wound #6R Left,Lateral Lower Leg: ABD Pad Silver Collagen Dressing Change Frequency: Wound #6R Left,Lateral Lower Leg: Change dressing every week - nurse visit for Monday in office to re-wrap Follow-up Appointments: Wound #6R Left,Lateral Lower Leg: Return Appointment in 1 week. Edema Control: Wound #6R Left,Lateral Lower Leg: 4-Layer Compression System - Left Lower Extremity. 1. I would recommend that we go ahead and continue with the 4 layer compression wrap since this seems to be beneficial for the patient he is in agreement with the plan. 2. I am in a suggest as well that we apply Prisma to the open wound location and then subsequently we will see where things stand at this site upon follow-up next week. 3. With regard to lymphedema pumps the good news is he should be getting these either tomorrow or Monday at the latest and hopefully this will help with getting his edema under better control his foot already seems to be  a little bit better based on what I am seeing today. 4. I still recommend that he continue to elevate his legs as much as possible. We will see patient back for reevaluation in 1 week here in the clinic. If anything worsens or changes patient will contact our office for additional recommendations. Electronic Signature(s) Signed: 12/27/2018 9:59:04 AM By: Worthy Keeler PA-C Entered By: Worthy Keeler on 12/27/2018 09:59:04 [name] ([id number]) -------------------------------------------------------------------------------- ROS/PFSH Details Patient Name: [name] Date of Service: 12/27/2018 9:00 AM Medical Record Number: [id number] Patient Account Number: [id number] Date of Birth/Sex: June 21, 1950 (68 y.o. M) Treating RN: Army Melia Primary Care Provider: SYSTEM, PCP Other Clinician: Referring Provider: Referral, Self Treating Provider/Extender: STONE III, Korde Jeppsen Weeks in Treatment: 6 Information Obtained From Patient Constitutional Symptoms (General Health) Complaints and Symptoms: Negative for: Fatigue; Fever; Chills; Marked Weight Change Respiratory Complaints and Symptoms: Negative for: Chronic or frequent coughs; Shortness of Breath Cardiovascular Complaints and Symptoms: Positive for: LE edema Negative for: Chest pain Medical History: Positive for: Hypertension; Peripheral Venous Disease Negative for: Angina; Arrhythmia; Congestive Heart Failure; Coronary Artery Disease; Deep  Vein Thrombosis; Hypotension; Myocardial Infarction; Peripheral Arterial Disease; Phlebitis; Vasculitis Psychiatric Complaints and Symptoms: Negative for: Anxiety; Claustrophobia Endocrine Medical History: Positive for: Type II Diabetes Treated with: Oral agents Integumentary (Skin) Medical History: Negative for: History of Burn; History of pressure wounds Neurologic Medical History: Positive for: Neuropathy Negative for: Dementia; Quadriplegia; Paraplegia; Seizure  Disorder Oncologic Medical History: Positive for: Received Chemotherapy MUNEER, LEIDER ([id number]) Negative for: Received Radiation Past Medical History Notes: 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 Immunizations Pneumococcal Vaccine: Received Pneumococcal Vaccination: Yes Immunization Notes: up to date Implantable Devices None Family and Social History Cancer: Yes - Mother; Diabetes: No; Heart Disease: No; Hereditary Spherocytosis: No; Hypertension: No; Kidney Disease: No; Lung Disease: No; Seizures: No; Stroke: Yes - Father; Thyroid Problems: No; Tuberculosis: No; Former smoker - quit 5 years ago; Marital Status - Divorced; Alcohol Use: Never; Drug Use: No History; Caffeine Use: Daily; Financial Concerns: No; Food, Clothing or Shelter Needs: No; Support System Lacking: No; Transportation Concerns: No Physician Affirmation I have reviewed and agree with the above information. Electronic Signature(s) Signed: 12/27/2018 11:51:11 AM By: Army Melia Signed: 12/27/2018 5:39:21 PM By: Worthy Keeler PA-C Entered By: Worthy Keeler on 12/27/2018 09:56:26 [name] ([id number]) -------------------------------------------------------------------------------- SuperBill Details Patient Name: [name] Date of Service: 12/27/2018 Medical Record Number: [id number] Patient Account Number: [id number] Date of Birth/Sex: 1951/03/12 (68 y.o. M) Treating RN: Army Melia Primary Care Provider: SYSTEM, PCP Other Clinician: Referring Provider: Referral, Self Treating Provider/Extender: STONE III, Marciana Uplinger Weeks in Treatment: 6 Diagnosis Coding ICD-10 Codes Code Description E11.622 Type 2 diabetes mellitus with other skin ulcer I89.0 Lymphedema, not elsewhere classified I87.2 Venous insufficiency (chronic) (peripheral) L97.822 Non-pressure chronic ulcer of other part of left lower leg with fat layer exposed I10 Essential (primary) hypertension Facility Procedures CPT4 Code: [id number] Description:  (Facility Use Only) [phone] - APPLY [place] LWR LT LEG Modifier: Quantity: 1 Physician Procedures CPT4 Code Description: [id number] - WC PHYS LEVEL 4 - EST PT ICD-10 Diagnosis Description E11.622 Type 2 diabetes mellitus with other skin ulcer I89.0 Lymphedema, not elsewhere classified I87.2 Venous insufficiency (chronic) (peripheral) L97.822  Non-pressure chronic ulcer of other part of left lower leg wit Modifier: h fat layer expos Quantity: 1 ed Electronic Signature(s) Signed: 12/27/2018 9:59:18 AM By: Worthy Keeler PA-C Entered By: Worthy Keeler on 12/27/2018 09:59:17

## 2018-12-27 NOTE — Progress Notes (Signed)
CARMICHAEL, BURDETTE ([id number]) Visit Report for 12/27/2018 Arrival Information Details Patient Name: [name], [name] Date of Service: 12/27/2018 9:00 AM Medical Record Number: [id number] Patient Account Number: [id number] Date of Birth/Sex: [birth year] (68 y.o. M) Treating RN: Montey Hora Primary Care Aiyla Baucom: SYSTEM, PCP Other Clinician: Referring Al Bracewell: Referral, Self Treating Feliza Diven/Extender: STONE III, [name] Weeks in Treatment: 6 Visit Information History Since Last Visit Added or deleted any medications: No Patient Arrived: Ambulatory Any new allergies or adverse reactions: No Arrival Time: 09:03 Had a fall or experienced change in No Accompanied By: self activities of daily living that may affect Transfer Assistance: None risk of falls: Patient Identification Verified: Yes Signs or symptoms of abuse/neglect since last visito No Secondary Verification Process Completed: Yes Hospitalized since last visit: No Patient Has Alerts: Yes Implantable device outside of the clinic excluding No Patient Alerts: DMII cellular tissue based products placed in the center since last visit: Has Dressing in Place as Prescribed: Yes Has Compression in Place as Prescribed: Yes Pain Present Now: No Electronic Signature(s) Signed: 12/27/2018 4:23:33 PM By: Montey Hora Entered By: Montey Hora on 12/27/2018 09:04:13 [name] ([id number]) -------------------------------------------------------------------------------- Compression Therapy Details Patient Name: [name] Date of Service: 12/27/2018 9:00 AM Medical Record Number: [id number] Patient Account Number: [id number] Date of Birth/Sex: [birth year] (68 y.o. M) Treating RN: Army Melia Primary Care Darcie Mellone: SYSTEM, PCP Other Clinician: Referring Namir Neto: Referral, Self Treating Refugia Laneve/Extender: STONE III, [name] Weeks in Treatment: 6 Compression Therapy Performed for Wound Assessment: Wound #6R Left,Lateral Lower Leg Performed  By: Clinician Army Melia, RN Compression Type: Four Layer Pre Treatment ABI: 1.5 Post Procedure Diagnosis Same as Pre-procedure Electronic Signature(s) Signed: 12/27/2018 11:51:11 AM By: Army Melia Entered By: Army Melia on 12/27/2018 09:31:17 [name] ([id number]) -------------------------------------------------------------------------------- Encounter Discharge Information Details Patient Name: [name] Date of Service: 12/27/2018 9:00 AM Medical Record Number: [id number] Patient Account Number: [id number] Date of Birth/Sex: Sep 20, 1950 (68 y.o. M) Treating RN: Army Melia Primary Care Trayvon Trumbull: SYSTEM, PCP Other Clinician: Referring Chikita Dogan: Referral, Self Treating Rowdy Guerrini/Extender: STONE III, [name] Weeks in Treatment: 6 Encounter Discharge Information Items Discharge Condition: Stable Ambulatory Status: Ambulatory Discharge Destination: Home Transportation: Private Auto Accompanied By: self Schedule Follow-up Appointment: Yes Clinical Summary of Care: Electronic Signature(s) Signed: 12/27/2018 11:51:11 AM By: Army Melia Entered By: Army Melia on 12/27/2018 09:33:37 [name] ([id number]) -------------------------------------------------------------------------------- Lower Extremity Assessment Details Patient Name: [name] Date of Service: 12/27/2018 9:00 AM Medical Record Number: [id number] Patient Account Number: [id number] Date of Birth/Sex: [birth year] (68 y.o. M) Treating RN: Montey Hora Primary Care Ofelia Podolski: SYSTEM, PCP Other Clinician: Referring Riot Barrick: Referral, Self Treating Jancie Kercher/Extender: STONE III, [name] Weeks in Treatment: 6 Edema Assessment Assessed: [Left: No] [Right: No] Edema: [Left: Ye] [Right: s] Calf Left: Right: Point of Measurement: 37 cm From Medial Instep 40 cm cm Ankle Left: Right: Point of Measurement: 13 cm From Medial Instep 29 cm cm Vascular Assessment Pulses: Dorsalis Pedis Palpable:  [Left:Yes] Electronic Signature(s) Signed: 12/27/2018 4:23:33 PM By: Montey Hora Entered By: Montey Hora on 12/27/2018 09:11:53 [name] ([id number]) -------------------------------------------------------------------------------- Multi Wound Chart Details Patient Name: [name] Date of Service: 12/27/2018 9:00 AM Medical Record Number: [id number] Patient Account Number: [id number] Date of Birth/Sex: 07-01-1950 (68 y.o. M) Treating RN: Army Melia Primary Care Liba Hulsey: SYSTEM, PCP Other Clinician: Referring Ashey Tramontana: Referral, Self Treating Navada Osterhout/Extender: STONE III, [name] Weeks in Treatment: 6 Vital Signs Height(in): 73 Pulse(bpm): 57 Weight(lbs): 300 Blood Pressure(mmHg): 129/56 Body Mass Index(BMI): 40 Temperature(F): 98.8 Respiratory Rate 18 (breaths/min): Photos: [N/A:N/A] Wound Location: Left Lower  Leg - Lateral N/A N/A Wounding Event: Blister N/A N/A Primary Etiology: Diabetic Wound/Ulcer of the N/A N/A Lower Extremity Secondary Etiology: Venous Leg Ulcer N/A N/A Comorbid History: Hypertension, Peripheral N/A N/A Venous Disease, Type II Diabetes, Neuropathy, Received Chemotherapy Date Acquired: 10/29/2018 N/A N/A Weeks of Treatment: 6 N/A N/A Wound Status: Open N/A N/A Wound Recurrence: Yes N/A N/A Clustered Wound: Yes N/A N/A Measurements L x W x D 6.2x3.5x0.1 N/A N/A (cm) Area (cm) : 17.043 N/A N/A Volume (cm) : 1.704 N/A N/A % Reduction in Area: 3.60% N/A N/A % Reduction in Volume: 3.60% N/A N/A Classification: Grade 1 N/A N/A Granulation Amount: Large (67-100%) N/A N/A Granulation Quality: Red N/A N/A Necrotic Amount: None Present (0%) N/A N/A Exposed Structures: Fat Layer (Subcutaneous N/A N/A Tissue) Exposed: Yes Fascia: No Tendon: No Muscle: No [name], [name] ([id number]) Joint: No Bone: No Epithelialization: None N/A N/A Treatment Notes Electronic Signature(s) Signed: 12/27/2018 11:51:11 AM By: Army Melia Entered By:  Army Melia on 12/27/2018 09:30:57 [name] ([id number]) -------------------------------------------------------------------------------- Multi-Disciplinary Care Plan Details Patient Name: [name] Date of Service: 12/27/2018 9:00 AM Medical Record Number: [id number] Patient Account Number: [id number] Date of Birth/Sex: [birth year] (68 y.o. M) Treating RN: Army Melia Primary Care Brayen Bunn: SYSTEM, PCP Other Clinician: Referring Anabeth Chilcott: Referral, Self Treating Kylinn Shropshire/Extender: STONE III, [name] Weeks in Treatment: 6 Active Inactive Venous Leg Ulcer Nursing Diagnoses: Actual venous Insuffiency (use after diagnosis is confirmed) Knowledge deficit related to disease process and management Goals: Patient will maintain optimal edema control Date Initiated: 11/12/2018 Target Resolution Date: 01/03/2019 Goal Status: Active Patient/caregiver will verbalize understanding of disease process and disease management Date Initiated: 11/12/2018 Target Resolution Date: 01/03/2019 Goal Status: Active Interventions: Assess peripheral edema status every visit. Compression as ordered Provide education on venous insufficiency Notes: Wound/Skin Impairment Nursing Diagnoses: Impaired tissue integrity Knowledge deficit related to ulceration/compromised skin integrity Goals: Ulcer/skin breakdown will have a volume reduction of 30% by week 4 Date Initiated: 11/12/2018 Target Resolution Date: 01/03/2019 Goal Status: Active Interventions: Assess patient/caregiver ability to obtain necessary supplies Assess patient/caregiver ability to perform ulcer/skin care regimen upon admission and as needed Assess ulceration(s) every visit Provide education on ulcer and skin care Notes: [name], [name] ([id number]) Electronic Signature(s) Signed: 12/27/2018 11:51:11 AM By: Army Melia Entered By: Army Melia on 12/27/2018 09:30:37 [name]  ([id number]) -------------------------------------------------------------------------------- Non-Wound Condition Assessment Details Patient Name: [name] Date of Service: 12/27/2018 9:00 AM Medical Record Number: [id number] Patient Account Number: [id number] Date of Birth/Sex: 07-Mar-1951 (68 y.o. M) Treating RN: Montey Hora Primary Care Cigi Bega: SYSTEM, PCP Other Clinician: Referring Magdalena Skilton: Referral, Self Treating Oceana Walthall/Extender: STONE III, [name] Weeks in Treatment: 6 Non-Wound Condition: Condition: Lymphedema Location: Leg Side: Left Notes: Anterior aspect of lower leg Photos Electronic Signature(s) Signed: 12/27/2018 4:23:33 PM By: Montey Hora Entered By: Montey Hora on 12/27/2018 09:12:53 [name] ([id number]) -------------------------------------------------------------------------------- Pain Assessment Details Patient Name: [name] Date of Service: 12/27/2018 9:00 AM Medical Record Number: [id number] Patient Account Number: [id number] Date of Birth/Sex: 06-24-1950 (68 y.o. M) Treating RN: Montey Hora Primary Care Dakiya Puopolo: SYSTEM, PCP Other Clinician: Referring Jocelynn Gioffre: Referral, Self Treating Adriene Knipfer/Extender: STONE III, [name] Weeks in Treatment: 6 Active Problems Location of Pain Severity and Description of Pain Patient Has Paino No Site Locations Pain Management and Medication Current Pain Management: Electronic Signature(s) Signed: 12/27/2018 4:23:33 PM By: Montey Hora Entered By: Montey Hora on 12/27/2018 09:07:47 [name] ([id number]) -------------------------------------------------------------------------------- Patient/Caregiver Education Details Patient Name: [name] Date of Service: 12/27/2018 9:00 AM Medical Record Number: [id number] Patient  Account Number: [id number] Date of Birth/Gender: Apr 26, 1951 (68 y.o. M) Treating RN: Army Melia Primary Care Physician: SYSTEM, PCP Other Clinician: Referring  Physician: Referral, Self Treating Physician/Extender: Melburn Hake, [name] Weeks in Treatment: 6 Education Assessment Education Provided To: Patient Education Topics Provided Wound/Skin Impairment: Handouts: Caring for Your Ulcer Methods: Demonstration, Explain/Verbal Responses: State content correctly Electronic Signature(s) Signed: 12/27/2018 11:51:11 AM By: Army Melia Entered By: Army Melia on 12/27/2018 09:32:30 [name] ([id number]) -------------------------------------------------------------------------------- Wound Assessment Details Patient Name: [name] Date of Service: 12/27/2018 9:00 AM Medical Record Number: [id number] Patient Account Number: [id number] Date of Birth/Sex: Sep 12, 1950 (68 y.o. M) Treating RN: Army Melia Primary Care Starlett Pehrson: SYSTEM, PCP Other Clinician: Referring Romayne Ticas: Referral, Self Treating Azarya Oconnell/Extender: STONE III, [name] Weeks in Treatment: 6 Wound Status Wound Number: 6R Primary Diabetic Wound/Ulcer of the Lower Extremity Etiology: Wound Location: Left Lower Leg - Lateral Secondary Venous Leg Ulcer Wounding Event: Blister Etiology: Date Acquired: 10/29/2018 Wound Open Weeks Of Treatment: 6 Status: Clustered Wound: Yes Comorbid Hypertension, Peripheral Venous Disease, History: Type II Diabetes, Neuropathy, Received Chemotherapy Photos Wound Measurements Length: (cm) 6.2 % Reduction i Width: (cm) 3.5 % Reduction i Depth: (cm) 0.1 Epithelializa Area: (cm) 17.043 Tunneling: Volume: (cm) 1.704 Undermining: n Area: 3.6% n Volume: 3.6% tion: None No No Wound Description Classification: Grade 1 Wound Bed Granulation Amount: Large (67-100%) Exposed Structure Granulation Quality: Red Fascia Exposed: No Necrotic Amount: None Present (0%) Fat Layer (Subcutaneous Tissue) Exposed: Yes Tendon Exposed: No Muscle Exposed: No Joint Exposed: No Bone Exposed: No Treatment Notes Wound #6R (Left, Lateral Lower Leg) [name], [name] ([id number]) Notes prisma, ABD, 4 layer Electronic Signature(s) Signed: 12/27/2018 11:51:11 AM By: Army Melia Entered By: Army Melia on 12/27/2018 09:28:18 [name] ([id number]) -------------------------------------------------------------------------------- Vitals Details Patient Name: [name] Date of Service: 12/27/2018 9:00 AM Medical Record Number: [id number] Patient Account Number: [id number] Date of Birth/Sex: 30-Dec-1950 (68 y.o. M) Treating RN: Montey Hora Primary Care Synai Prettyman: SYSTEM, PCP Other Clinician: Referring Tyshawn Keel: Referral, Self Treating Lyman Balingit/Extender: STONE III, [name] Weeks in Treatment: 6 Vital Signs Time Taken: 09:07 Temperature (F): 98.8 Height (in): 73 Pulse (bpm): 57 Weight (lbs): 300 Respiratory Rate (breaths/min): 18 Body Mass Index (BMI): 39.6 Blood Pressure (mmHg): 129/56 Reference Range: 80 - 120 mg / dl Electronic Signature(s) Signed: 12/27/2018 4:23:33 PM By: Montey Hora Entered By: Montey Hora on 12/27/2018 09:08:10

## 2019-01-03 ENCOUNTER — Encounter: Payer: PPO | Admitting: Physician Assistant

## 2019-01-03 ENCOUNTER — Other Ambulatory Visit: Payer: Self-pay

## 2019-01-03 DIAGNOSIS — E11622 Type 2 diabetes mellitus with other skin ulcer: Secondary | ICD-10-CM | POA: Diagnosis not present

## 2019-01-03 DIAGNOSIS — L97829 Non-pressure chronic ulcer of other part of left lower leg with unspecified severity: Secondary | ICD-10-CM | POA: Diagnosis not present

## 2019-01-03 NOTE — Progress Notes (Addendum)
ANGLE, HEROD ([id number]) Visit Report for 01/03/2019 Chief Complaint Document Details Patient Name: [name], [name] Date of Service: 01/03/2019 9:00 AM Medical Record Number: [id number] Patient Account Number: [id number] Date of Birth/Sex: [birth year] (68 y.o. M) Treating RN: Army Melia Primary Care Provider: SYSTEM, PCP Other Clinician: Referring Provider: Referral, Self Treating Provider/Extender: STONE III, [name] Weeks in Treatment: 7 Information Obtained from: Patient Chief Complaint Left LE ulcers Electronic Signature(s) Signed: 01/03/2019 9:15:55 AM By: Worthy Keeler PA-C Entered By: Worthy Keeler on 01/03/2019 09:15:54 [name] ([id number]) -------------------------------------------------------------------------------- HPI Details Patient Name: [name] Date of Service: 01/03/2019 9:00 AM Medical Record Number: [id number] Patient Account Number: [id number] Date of Birth/Sex: [birth year] (68 y.o. M) Treating RN: Army Melia Primary Care Provider: SYSTEM, PCP Other Clinician: Referring Provider: Referral, Self Treating Provider/Extender: STONE III, [name] Weeks in Treatment: 7 History of Present Illness HPI Description: 68 year old male who presented to the ER with bilateral lower extremity blisters which had started last week. he has a past medical history of leukemia, diabetes mellitus, hypertension, edema of both lower extremities, his recurrent skin infections, peripheral vascular disease, coronary artery disease, congestive heart failure and peripheral neuropathy. in the ER he was given Rocephin and put on Silvadene cream. he was put on oral doxycycline and was asked to follow-up with the [facility]. His last hemoglobin A1c was 6.6 in December and he checks his blood sugar once a week. He does not have any physicians outside the [place] system. He does not recall any vascular duplex studies done either for arterial or venous disease but was told to wear  compression stockings which he does not use 05/30/2016 -- we have not yet received any of his notes from the [facility] hospital system and his arterial and venous duplex studies are scheduled here in [place] around mid February. We are unable to have his insurance accepted by home health agencies and hence he is getting dressings only once a week. 06/06/16 -- -- I received a call from the patient's PCP at the [facility] at [facility] and spoke to Dr. Garvin Fila, phone number [phone] and fax number [phone]. She confirmed that no vascular testing was done over the last 5 years and she would be happy to do them if the patient did want them to be done at the [place] and we could fax him a request. Readmission: 68 year old male seen by as in February of this year and was referred to vein and vascular for studies and opinion from the vascular surgeons. The patient returns today with a fresh problem having had blisters on his left lower extremity which have been there for about 5 days and he clearly states that he has been wearing his compression stockings as advised though he could not read the moderate compression and has been wearing light compression. Review of his electronic medical records note that he had lower extremity arterial duplex examination done on 06/23/2016 which showed no hemodynamically significant stenosis in the bilateral lower extremity arterial system. He also had a lower extremity venous reflux examination done on 07/07/2016 and it was noted that he had venous incompetence in the right great saphenous vein and bilateral common femoral veins. Patient was seen by Dr. Tamala Julian on the same day and for some reason his notes do not reflect the venous studies or the arterial studies and he recommended patient do a venous duplex ultrasound to look for reflux and return to see him.he would also consider a lymph pump if required. The patient was told that  his workup was normal and hence the  patient canceled his follow-up appointment. 02/03/17 on evaluation today patient left medial lower extremity blister appears to be doing about the same. It is still continuing to drain and there's still the blistered skin covering the wound bed which is making it difficult for the alternate to do its job. Fortunately there is no evidence of cellulitis. No fevers chills noted. Patient states in general he is not having any significant discomfort. Patient's lower extremity arterial duplex exam revealed that patient was hemodynamically stable with no evidence of stenosis in regard to the bilateral lower extremities. The lower extremity venous reflux exam revealed the patient had venous incontinence noted in the right greater saphenous and bilateral common femoral vein. There is no evidence of deep or superficial vein thrombosis in the bilateral lower extremities. Readmission: [name] ([id number]) 11/12/18 Patient presents for evaluation our clinic today concerning issues that he is having with his left lower extremity. He tells me that a couple weeks ago he began developing blisters on the left lower extremity along with increased swelling. He typically wears his compression stockings on a regular basis is previously been evaluated both here as well is with vascular surgery they would recommend lymphedema pumps but unfortunately that somehow fell through and he never heard anything back from that. Nonetheless I think lymphedema pumps would be beneficial for this patient. He does have a history of hypertension and diabetes. Obviously the chronic venous stasis and lymphedema as well. At this point the blisters have been given in more trouble he states sometimes when the blisters openings able to clean it down with alcohol and it will dry out and do well. Unfortunately that has not been the case this time. He is having some discomfort although this mean these with cleaning the areas he doesn't have  discomfort just on a regular basis. He has not been able to wear his compression stockings since the blisters arose due to the fact that of course it will drain into the socks causing additional issues and he didn't have any way to wrap this otherwise. He has increased to taking his Lasix every day instead of every other day. He sees his primary care provider later this month as well. No fevers, chills, nausea, or vomiting noted at this time. 11/19/18-Patient returns at 1 week, per intake RN the amount of seepage into the compression wraps was definitely improved, overall all the wounds are measuring smaller but continuing silver alginate to the wounds as primary dressing 11/26/18 on evaluation today patient appears to be doing quite well in regard to his left lower Trinity ulcers. In fact of the areas that were noted initially he only has two regions still open. There is no evidence of active infection at this time. He still is not heard anything from the company regarding lymphedema pumps as of yet. Again as previously seen vascular they have not recommended any surgical intervention. 12/03/2018 on evaluation today patient actually appears to be doing quite well with regard to his lower extremity ulcers. In fact most of the areas appear to be healed the one spot which does not seem to be completely healed I am unsure of whether or not this is really draining that much but nonetheless there does not appear to be any signs of infection or significant drainage at this point. There is no sign of fever, chills, nausea, vomiting, or diarrhea. Overall I am pleased with how things have progressed I think is very close  to being able to transition to his home compression stockings. 12/10/2018 upon evaluation today patient appears to be doing quite well with regard to his left lower extremity. He has been tolerating the dressing changes without complication. Fortunately there is no signs of active infection at  this time. He appears after thorough evaluation of his leg to only have 1 small area that remains open at this point everything else appears to be almost completely closed. He still have significant swelling of the left lower extremity. We had discussed discussing this with his primary care provider he is not able to see her in person they were at the [facility] and right now the [place] is not seeing patients on site. According to the patient anyway. Subsequently he did speak with her apparently and his primary care provider feels that he may likely have a DVT. With that being said she has not seen his leg she is just going off of his history. Nonetheless that is a concern that the patient now has as well and while I do not feel the DVT is likely we can definitely ensure that that is not the case I will go ahead and see about putting that order in today. Nonetheless otherwise I am in a recommend that we continue with the current wound care measures including the compression therapy most likely. We just need to ensure that his leg is indeed free of any DVTs. 12/17/2018 on evaluation today patient actually appears to be completely healed today. He does have 2 very small areas of blistering although this is not anything too significant at this point which is good news. With that being said I am in agreement with the fact that I think he is completely healed at this point. He does want to get back into his compression stocking. The good news is we have gotten approval from insurance for his lymphedema pumps we received a letter since last saw him last week. The other good news is his study did come back and showed no evidence of a DVT. 12/20/2018 on evaluation today patient presents for follow-up concerning his ongoing issues with his left lower extremity. He was actually discharged last Friday and did fairly well until he states blisters opened this morning. He tells me he has been wearing his compression  stocking although he has a hard time getting this on. There does not appear to be any signs of active infection at this time. No fevers, chills, nausea, vomiting, or diarrhea. 12/27/2018 on evaluation today patient appears to be doing very well with regard to his swelling of the left lower extremity the 4 layer compression wrap seems to have been beneficial for him. Fortunately there is no signs of active infection at this time. Patient has been tolerating the compression wrap without complication and his foot swelling in particular appears to be greatly improved. He does still have a wound on the lateral portion of his left leg I believe this is more of a blister that has now reopened. 01/03/2019 on evaluation today patient actually appears to be doing excellent in regard to his left lower extremity. He did receive his compression pumps and is actually use this 7 times since he was last here in the office. On top of the compression wrap he is now roughly 3 cm better at the calf and 2 cm better at the ankle he also states that his foot seem to go an issue better without even having to use a shoe horn.  Obviously I think this is all evidence that he is doing excellent in this regard. The other good news is he does not appear to have anything open today as far as wounds are concerned. RONNEL, RAVENSCRAFT ([id number]) Electronic Signature(s) Signed: 01/03/2019 9:40:53 AM By: Worthy Keeler PA-C Entered By: Worthy Keeler on 01/03/2019 09:40:53 [name] ([id number]) -------------------------------------------------------------------------------- Physical Exam Details Patient Name: [name] Date of Service: 01/03/2019 9:00 AM Medical Record Number: [id number] Patient Account Number: [id number] Date of Birth/Sex: [birth year] (68 y.o. M) Treating RN: Army Melia Primary Care Provider: SYSTEM, PCP Other Clinician: Referring Provider: Referral, Self Treating Provider/Extender: STONE III, [name] Weeks in  Treatment: 7 Constitutional Obese and well-hydrated in no acute distress. Respiratory normal breathing without difficulty. Psychiatric this patient is able to make decisions and demonstrates good insight into disease process. Alert and Oriented x 3. pleasant and cooperative. Notes Upon inspection patient's wound bed actually showed signs of good epithelialization there did not appear to be any wound openings today which is great news overall very pleased with how things are doing. His edema is under excellent control and obviously he has been using the lymphedema pumps and they are effective for him. Electronic Signature(s) Signed: 01/03/2019 9:41:26 AM By: Worthy Keeler PA-C Entered By: Worthy Keeler on 01/03/2019 09:41:26 [name] ([id number]) -------------------------------------------------------------------------------- Physician Orders Details Patient Name: [name] Date of Service: 01/03/2019 9:00 AM Medical Record Number: [id number] Patient Account Number: [id number] Date of Birth/Sex: 21-Oct-1950 (68 y.o. M) Treating RN: Army Melia Primary Care Provider: SYSTEM, PCP Other Clinician: Referring Provider: Referral, Self Treating Provider/Extender: STONE III, [name] Weeks in Treatment: 7 Verbal / Phone Orders: No Diagnosis Coding ICD-10 Coding Code Description E11.622 Type 2 diabetes mellitus with other skin ulcer I89.0 Lymphedema, not elsewhere classified I87.2 Venous insufficiency (chronic) (peripheral) L97.822 Non-pressure chronic ulcer of other part of left lower leg with fat layer exposed I10 Essential (primary) hypertension Edema Control o Patient to wear own compression stockings o Elevate legs to the level of the heart and pump ankles as often as possible o Compression Pump: Use compression pump on left lower extremity for 60 minutes, twice daily. o Compression Pump: Use compression pump on right lower extremity for 60 minutes, twice  daily. Discharge From [facility] o Discharge from [place] as needed Electronic Signature(s) Signed: 01/06/2019 6:59:38 PM By: Worthy Keeler PA-C Entered By: Worthy Keeler on 01/03/2019 09:38:11 [name] ([id number]) -------------------------------------------------------------------------------- Problem List Details Patient Name: [name] Date of Service: 01/03/2019 9:00 AM Medical Record Number: [id number] Patient Account Number: [id number] Date of Birth/Sex: 09/28/1950 (68 y.o. M) Treating RN: Army Melia Primary Care Provider: SYSTEM, PCP Other Clinician: Referring Provider: Referral, Self Treating Provider/Extender: Melburn Hake, [name] Weeks in Treatment: 7 Active Problems ICD-10 Evaluated Encounter Code Description Active Date Today Diagnosis E11.622 Type 2 diabetes mellitus with other skin ulcer 11/12/2018 No Yes I89.0 Lymphedema, not elsewhere classified 11/12/2018 No Yes I87.2 Venous insufficiency (chronic) (peripheral) 11/12/2018 No Yes L97.822 Non-pressure chronic ulcer of other part of left lower leg with 11/12/2018 No Yes fat layer exposed I10 Essential (primary) hypertension 11/12/2018 No Yes Inactive Problems Resolved Problems Electronic Signature(s) Signed: 01/03/2019 9:15:47 AM By: Worthy Keeler PA-C Entered By: Worthy Keeler on 01/03/2019 09:15:47 [name] ([id number]) -------------------------------------------------------------------------------- Progress Note Details Patient Name: [name] Date of Service: 01/03/2019 9:00 AM Medical Record Number: [id number] Patient Account Number: [id number] Date of Birth/Sex: 06-21-1950 (68 y.o. M) Treating RN: Army Melia Primary Care Provider:  SYSTEM, PCP Other Clinician: Referring Provider: Referral, Self Treating Provider/Extender: STONE III, [name] Weeks in Treatment: 7 Subjective Chief Complaint Information obtained from Patient Left LE ulcers History of Present Illness  (HPI) 68 year old male who presented to the ER with bilateral lower extremity blisters which had started last week. he has a past medical history of leukemia, diabetes mellitus, hypertension, edema of both lower extremities, his recurrent skin infections, peripheral vascular disease, coronary artery disease, congestive heart failure and peripheral neuropathy. in the ER he was given Rocephin and put on Silvadene cream. he was put on oral doxycycline and was asked to follow-up with the [facility]. His last hemoglobin A1c was 6.6 in December and he checks his blood sugar once a week. He does not have any physicians outside the [place] system. He does not recall any vascular duplex studies done either for arterial or venous disease but was told to wear compression stockings which he does not use 05/30/2016 -- we have not yet received any of his notes from the [facility] hospital system and his arterial and venous duplex studies are scheduled here in [place] around mid February. We are unable to have his insurance accepted by home health agencies and hence he is getting dressings only once a week. 06/06/16 -- -- I received a call from the patient's PCP at the [facility] and spoke to Dr. Garvin Fila, phone number [phone] and fax number [phone]. She confirmed that no vascular testing was done over the last 5 years and she would be happy to do them if the patient did want them to be done at the [place] and we could fax him a request. Readmission: 68 year old male seen by as in February of this year and was referred to vein and vascular for studies and opinion from the vascular surgeons. The patient returns today with a fresh problem having had blisters on his left lower extremity which have been there for about 5 days and he clearly states that he has been wearing his compression stockings as advised though he could not read the moderate compression and has been wearing light compression. Review of his  electronic medical records note that he had lower extremity arterial duplex examination done on 06/23/2016 which showed no hemodynamically significant stenosis in the bilateral lower extremity arterial system. He also had a lower extremity venous reflux examination done on 07/07/2016 and it was noted that he had venous incompetence in the right great saphenous vein and bilateral common femoral veins. Patient was seen by Dr. Tamala Julian on the same day and for some reason his notes do not reflect the venous studies or the arterial studies and he recommended patient do a venous duplex ultrasound to look for reflux and return to see him.he would also consider a lymph pump if required. The patient was told that his workup was normal and hence the patient canceled his follow-up appointment. 02/03/17 on evaluation today patient left medial lower extremity blister appears to be doing about the same. It is still continuing to drain and there's still the blistered skin covering the wound bed which is making it difficult for the alternate to do its job. Fortunately there is no evidence of cellulitis. No fevers chills noted. Patient states in general he is not having any significant discomfort. Patient's lower extremity arterial duplex exam revealed that patient was hemodynamically stable with no evidence of stenosis in regard to the bilateral lower extremities. The lower extremity venous reflux exam revealed the patient had venous incontinence noted  in the right greater saphenous [place], [name] ([id number]) and bilateral common femoral vein. There is no evidence of deep or superficial vein thrombosis in the bilateral lower extremities. Readmission: 11/12/18 Patient presents for evaluation our clinic today concerning issues that he is having with his left lower extremity. He tells me that a couple weeks ago he began developing blisters on the left lower extremity along with increased swelling. He typically wears his  compression stockings on a regular basis is previously been evaluated both here as well is with vascular surgery they would recommend lymphedema pumps but unfortunately that somehow fell through and he never heard anything back from that. Nonetheless I think lymphedema pumps would be beneficial for this patient. He does have a history of hypertension and diabetes. Obviously the chronic venous stasis and lymphedema as well. At this point the blisters have been given in more trouble he states sometimes when the blisters openings able to clean it down with alcohol and it will dry out and do well. Unfortunately that has not been the case this time. He is having some discomfort although this mean these with cleaning the areas he doesn't have discomfort just on a regular basis. He has not been able to wear his compression stockings since the blisters arose due to the fact that of course it will drain into the socks causing additional issues and he didn't have any way to wrap this otherwise. He has increased to taking his Lasix every day instead of every other day. He sees his primary care provider later this month as well. No fevers, chills, nausea, or vomiting noted at this time. 11/19/18-Patient returns at 1 week, per intake RN the amount of seepage into the compression wraps was definitely improved, overall all the wounds are measuring smaller but continuing silver alginate to the wounds as primary dressing 11/26/18 on evaluation today patient appears to be doing quite well in regard to his left lower Trinity ulcers. In fact of the areas that were noted initially he only has two regions still open. There is no evidence of active infection at this time. He still is not heard anything from the company regarding lymphedema pumps as of yet. Again as previously seen vascular they have not recommended any surgical intervention. 12/03/2018 on evaluation today patient actually appears to be doing quite well with  regard to his lower extremity ulcers. In fact most of the areas appear to be healed the one spot which does not seem to be completely healed I am unsure of whether or not this is really draining that much but nonetheless there does not appear to be any signs of infection or significant drainage at this point. There is no sign of fever, chills, nausea, vomiting, or diarrhea. Overall I am pleased with how things have progressed I think is very close to being able to transition to his home compression stockings. 12/10/2018 upon evaluation today patient appears to be doing quite well with regard to his left lower extremity. He has been tolerating the dressing changes without complication. Fortunately there is no signs of active infection at this time. He appears after thorough evaluation of his leg to only have 1 small area that remains open at this point everything else appears to be almost completely closed. He still have significant swelling of the left lower extremity. We had discussed discussing this with his primary care provider he is not able to see her in person they were at the [facility] and right now the [place]  is not seeing patients on site. According to the patient anyway. Subsequently he did speak with her apparently and his primary care provider feels that he may likely have a DVT. With that being said she has not seen his leg she is just going off of his history. Nonetheless that is a concern that the patient now has as well and while I do not feel the DVT is likely we can definitely ensure that that is not the case I will go ahead and see about putting that order in today. Nonetheless otherwise I am in a recommend that we continue with the current wound care measures including the compression therapy most likely. We just need to ensure that his leg is indeed free of any DVTs. 12/17/2018 on evaluation today patient actually appears to be completely healed today. He does have 2 very small areas  of blistering although this is not anything too significant at this point which is good news. With that being said I am in agreement with the fact that I think he is completely healed at this point. He does want to get back into his compression stocking. The good news is we have gotten approval from insurance for his lymphedema pumps we received a letter since last saw him last week. The other good news is his study did come back and showed no evidence of a DVT. 12/20/2018 on evaluation today patient presents for follow-up concerning his ongoing issues with his left lower extremity. He was actually discharged last Friday and did fairly well until he states blisters opened this morning. He tells me he has been wearing his compression stocking although he has a hard time getting this on. There does not appear to be any signs of active infection at this time. No fevers, chills, nausea, vomiting, or diarrhea. 12/27/2018 on evaluation today patient appears to be doing very well with regard to his swelling of the left lower extremity the 4 layer compression wrap seems to have been beneficial for him. Fortunately there is no signs of active infection at this time. Patient has been tolerating the compression wrap without complication and his foot swelling in particular appears to be greatly improved. He does still have a wound on the lateral portion of his left leg I believe this is more of a blister that has now reopened. DANG, CORPUS ([id number]) 01/03/2019 on evaluation today patient actually appears to be doing excellent in regard to his left lower extremity. He did receive his compression pumps and is actually use this 7 times since he was last here in the office. On top of the compression wrap he is now roughly 3 cm better at the calf and 2 cm better at the ankle he also states that his foot seem to go an issue better without even having to use a shoe horn. Obviously I think this is all evidence that he  is doing excellent in this regard. The other good news is he does not appear to have anything open today as far as wounds are concerned. Patient History Information obtained from Patient. Family History Cancer - Mother, Stroke - Father, No family history of Diabetes, Heart Disease, Hereditary Spherocytosis, Hypertension, Kidney Disease, Lung Disease, Seizures, Thyroid Problems, Tuberculosis. Social History Former smoker - quit 5 years ago, Marital Status - Divorced, Alcohol Use - Never, Drug Use - No History, Caffeine Use - Daily. Medical History Cardiovascular Patient has history of Hypertension, Peripheral Venous Disease Denies history of Angina, Arrhythmia, Congestive Heart Failure, Coronary  Artery Disease, Deep Vein Thrombosis, Hypotension, Myocardial Infarction, Peripheral Arterial Disease, Phlebitis, Vasculitis Endocrine Patient has history of Type II Diabetes Integumentary (Skin) Denies history of History of Burn, History of pressure wounds Neurologic Patient has history of Neuropathy Denies history of Dementia, Quadriplegia, Paraplegia, Seizure Disorder Oncologic Patient has history of Received Chemotherapy Denies history of Received Radiation Medical And Surgical History Notes 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 Respiratory Denies complaints or symptoms of Chronic or frequent coughs, Shortness of Breath. Cardiovascular Complains or has symptoms of LE edema. Denies complaints or symptoms of Chest pain. Psychiatric Denies complaints or symptoms of Anxiety, Claustrophobia. Objective KEAGON, HOFMANN ([id number]) Constitutional Obese and well-hydrated in no acute distress. Vitals Time Taken: 9:15 AM, Height: 73 in, Weight: 300 lbs, BMI: 39.6, Temperature: 98.7 F, Pulse: 52 bpm, Respiratory Rate: 18 breaths/min, Blood  Pressure: 128/57 mmHg. Respiratory normal breathing without difficulty. Psychiatric this patient is able to make decisions and demonstrates good insight into disease process. Alert and Oriented x 3. pleasant and cooperative. General Notes: Upon inspection patient's wound bed actually showed signs of good epithelialization there did not appear to be any wound openings today which is great news overall very pleased with how things are doing. His edema is under excellent control and obviously he has been using the lymphedema pumps and they are effective for him. Integumentary (Hair, Skin) Wound #6R status is Healed - Epithelialized. Original cause of wound was Blister. The wound is located on the Left,Lateral Lower Leg. The wound measures 0cm length x 0cm width x 0cm depth; 0cm^2 area and 0cm^3 volume. There is no tunneling or undermining noted. There is a none present amount of drainage noted. The wound margin is indistinct and nonvisible. There is no granulation within the wound bed. There is no necrotic tissue within the wound bed. Assessment Active Problems ICD-10 Type 2 diabetes mellitus with other skin ulcer Lymphedema, not elsewhere classified Venous insufficiency (chronic) (peripheral) Non-pressure chronic ulcer of other part of left lower leg with fat layer exposed Essential (primary) hypertension Plan Edema Control: Patient to wear own compression stockings Elevate legs to the level of the heart and pump ankles as often as possible Compression Pump: Use compression pump on left lower extremity for 60 minutes, twice daily. Compression Pump: Use compression pump on right lower extremity for 60 minutes, twice daily. Discharge From [facility] Services: Discharge from [place] as needed AIDON, HERGENRADER ([id number]) 1. I would recommend that he continue to wear his compression stockings on a regular basis and he is in agreement with that plan. I told him that he does not  have to remove those to use the pumps he can use the pumps over top of the stockings. 2. He will continue to utilize the lymphedema pumps for 60 minutes 2 times a day he actually is doing this very well at this point as well and again I think he is making excellent progress. Patient will follow-up as needed here in the office. Electronic Signature(s) Signed: 01/03/2019 9:42:00 AM By: Worthy Keeler PA-C Entered By: Worthy Keeler on 01/03/2019 09:41:59 [name] ([id number]) -------------------------------------------------------------------------------- ROS/PFSH Details Patient Name: [name] Date of Service: 01/03/2019 9:00 AM Medical Record Number: [id number] Patient Account Number: [id number] Date of Birth/Sex: [birth year] (68 y.o. M) Treating RN: Army Melia Primary Care Provider: SYSTEM, PCP Other Clinician: Referring Provider: Referral, Self Treating Provider/Extender: Joaquim Lai  III, [name] Weeks in Treatment: 7 Information Obtained From Patient Constitutional Symptoms (General Health) Complaints and Symptoms: Negative for: Fatigue; Fever; Chills; Marked Weight Change Respiratory Complaints and Symptoms: Negative for: Chronic or frequent coughs; Shortness of Breath Cardiovascular Complaints and Symptoms: Positive for: LE edema Negative for: Chest pain Medical History: Positive for: Hypertension; Peripheral Venous Disease Negative for: Angina; Arrhythmia; Congestive Heart Failure; Coronary Artery Disease; Deep Vein Thrombosis; Hypotension; Myocardial Infarction; Peripheral Arterial Disease; Phlebitis; Vasculitis Psychiatric Complaints and Symptoms: Negative for: Anxiety; Claustrophobia Endocrine Medical History: Positive for: Type II Diabetes Treated with: Oral agents Integumentary (Skin) Medical History: Negative for: History of Burn; History of pressure wounds Neurologic Medical History: Positive for: Neuropathy Negative for: Dementia; Quadriplegia; Paraplegia;  Seizure Disorder Oncologic Medical History: Positive for: Received Chemotherapy RALPHAEL, TRAME ([id number]) Negative for: Received Radiation Past Medical History Notes: 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 Immunizations Pneumococcal Vaccine: Received Pneumococcal Vaccination: Yes Immunization Notes: up to date Implantable Devices None Family and Social History Cancer: Yes - Mother; Diabetes: No; Heart Disease: No; Hereditary Spherocytosis: No; Hypertension: No; Kidney Disease: No; Lung Disease: No; Seizures: No; Stroke: Yes - Father; Thyroid Problems: No; Tuberculosis: No; Former smoker - quit 5 years ago; Marital Status - Divorced; Alcohol Use: Never; Drug Use: No History; Caffeine Use: Daily; Financial Concerns: No; Food, Clothing or Shelter Needs: No; Support System Lacking: No; Transportation Concerns: No Physician Affirmation I have reviewed and agree with the above information. Electronic Signature(s) Signed: 01/03/2019 11:49:33 AM By: Army Melia Signed: 01/06/2019 6:59:38 PM By: Worthy Keeler PA-C Entered By: Worthy Keeler on 01/03/2019 09:41:09 [name] ([id number]) -------------------------------------------------------------------------------- SuperBill Details Patient Name: [name] Date of Service: 01/03/2019 Medical Record Number: [id number] Patient Account Number: [id number] Date of Birth/Sex: 1950/11/05 (68 y.o. M) Treating RN: Army Melia Primary Care Provider: SYSTEM, PCP Other Clinician: Referring Provider: Referral, Self Treating Provider/Extender: STONE III, [name] Weeks in Treatment: 7 Diagnosis Coding ICD-10 Codes Code Description E11.622 Type 2 diabetes mellitus with other skin ulcer I89.0 Lymphedema, not elsewhere classified I87.2 Venous insufficiency (chronic) (peripheral) L97.822 Non-pressure chronic ulcer of other part of left lower leg with fat layer exposed I10 Essential (primary) hypertension Physician Procedures CPT4 Code Description:  [id number] - WC PHYS LEVEL 3 - EST PT ICD-10 Diagnosis Description E11.622 Type 2 diabetes mellitus with other skin ulcer I89.0 Lymphedema, not elsewhere classified I87.2 Venous insufficiency (chronic) (peripheral) L97.822  Non-pressure chronic ulcer of other part of left lower leg wit Modifier: h fat layer expos Quantity: 1 ed Electronic Signature(s) Signed: 01/03/2019 9:42:17 AM By: Worthy Keeler PA-C Entered By: Worthy Keeler on 01/03/2019 09:42:17

## 2019-01-06 NOTE — Progress Notes (Signed)
[name] ([id number]) Visit Report for 01/03/2019 Arrival Information Details Patient Name: [name] Date of Service: 01/03/2019 9:00 AM Medical Record Number: [id number] Patient Account Number: [id number] Date of Birth/Sex: 1950-05-12 (68 y.o. M) Treating RN: Cornell Barman Primary Care Carrissa Taitano: SYSTEM, PCP Other Clinician: Referring Daleyza Gadomski: Referral, Self Treating Anette Barra/Extender: STONE III, [name] Weeks in Treatment: 7 Visit Information History Since Last Visit Added or deleted any medications: No Patient Arrived: Ambulatory Any new allergies or adverse reactions: No Arrival Time: 09:14 Had a fall or experienced change in No Accompanied By: self activities of daily living that may affect Transfer Assistance: None risk of falls: Patient Identification Verified: Yes Signs or symptoms of abuse/neglect since last visito No Secondary Verification Process Completed: Yes Hospitalized since last visit: No Patient Has Alerts: Yes Implantable device outside of the clinic excluding No Patient Alerts: DMII cellular tissue based products placed in the center since last visit: Has Compression in Place as Prescribed: Yes Pain Present Now: No Electronic Signature(s) Signed: 01/04/2019 9:32:15 AM By: Gretta Cool, BSN, RN, CWS, Kim RN, BSN Entered By: Gretta Cool, BSN, RN, CWS, Kim on 01/03/2019 09:14:44 [name] ([id number]) -------------------------------------------------------------------------------- Clinic Level of Care Assessment Details Patient Name: [name] Date of Service: 01/03/2019 9:00 AM Medical Record Number: [id number] Patient Account Number: [id number] Date of Birth/Sex: July 04, 1950 (68 y.o. M) Treating RN: Army Melia Primary Care Dawayne Ohair: SYSTEM, PCP Other Clinician: Referring Leeasia Secrist: Referral, Self Treating Steffani Dionisio/Extender: STONE III, [name] Weeks in Treatment: 7 Clinic Level of Care Assessment Items TOOL 4 Quantity Score []  - Use when only an EandM is  performed on FOLLOW-UP visit 0 ASSESSMENTS - Nursing Assessment / Reassessment X - Reassessment of Co-morbidities (includes updates in patient status) 1 10 X- 1 5 Reassessment of Adherence to Treatment Plan ASSESSMENTS - Wound and Skin Assessment / Reassessment X - Simple Wound Assessment / Reassessment - one wound 1 5 []  - 0 Complex Wound Assessment / Reassessment - multiple wounds []  - 0 Dermatologic / Skin Assessment (not related to wound area) ASSESSMENTS - Focused Assessment []  - Circumferential Edema Measurements - multi extremities 0 []  - 0 Nutritional Assessment / Counseling / Intervention []  - 0 Lower Extremity Assessment (monofilament, tuning fork, pulses) []  - 0 Peripheral Arterial Disease Assessment (using hand held doppler) ASSESSMENTS - Ostomy and/or Continence Assessment and Care []  - Incontinence Assessment and Management 0 []  - 0 Ostomy Care Assessment and Management (repouching, etc.) PROCESS - Coordination of Care X - Simple Patient / Family Education for ongoing care 1 15 []  - 0 Complex (extensive) Patient / Family Education for ongoing care []  - 0 Staff obtains Programmer, systems, Records, Test Results / Process Orders []  - 0 Staff telephones HHA, Nursing Homes / Clarify orders / etc []  - 0 Routine Transfer to another Facility (non-emergent condition) []  - 0 Routine Hospital Admission (non-emergent condition) []  - 0 New Admissions / Biomedical engineer / Ordering NPWT, Apligraf, etc. []  - 0 Emergency Hospital Admission (emergent condition) X- 1 10 Simple Discharge Coordination [place], [name] ([id number]) []  - 0 Complex (extensive) Discharge Coordination PROCESS - Special Needs []  - Pediatric / Minor Patient Management 0 []  - 0 Isolation Patient Management []  - 0 Hearing / Language / Visual special needs []  - 0 Assessment of Community assistance (transportation, D/C planning, etc.) []  - 0 Additional assistance / Altered mentation []  - 0 Support  Surface(s) Assessment (bed, cushion, seat, etc.) INTERVENTIONS - Wound Cleansing / Measurement X - Simple Wound Cleansing - one wound 1 5 []  - 0 Complex  Wound Cleansing - multiple wounds X- 1 5 Wound Imaging (photographs - any number of wounds) []  - 0 Wound Tracing (instead of photographs) X- 1 5 Simple Wound Measurement - one wound []  - 0 Complex Wound Measurement - multiple wounds INTERVENTIONS - Wound Dressings []  - Small Wound Dressing one or multiple wounds 0 []  - 0 Medium Wound Dressing one or multiple wounds []  - 0 Large Wound Dressing one or multiple wounds []  - 0 Application of Medications - topical []  - 0 Application of Medications - injection INTERVENTIONS - Miscellaneous []  - External ear exam 0 []  - 0 Specimen Collection (cultures, biopsies, blood, body fluids, etc.) []  - 0 Specimen(s) / Culture(s) sent or taken to Lab for analysis []  - 0 Patient Transfer (multiple staff / Civil Service fast streamer / Similar devices) []  - 0 Simple Staple / Suture removal (25 or less) []  - 0 Complex Staple / Suture removal (26 or more) []  - 0 Hypo / Hyperglycemic Management (close monitor of Blood Glucose) []  - 0 Ankle / Brachial Index (ABI) - do not check if billed separately X- 1 5 Vital Signs [name], [name] ([id number]) Has the patient been seen at the hospital within the last three years: Yes Total Score: 65 Level Of Care: New/Established - Level 2 Electronic Signature(s) Signed: 01/03/2019 11:49:33 AM By: Army Melia Entered By: Army Melia on 01/03/2019 10:01:18 [name] ([id number]) -------------------------------------------------------------------------------- Encounter Discharge Information Details Patient Name: [name] Date of Service: 01/03/2019 9:00 AM Medical Record Number: [id number] Patient Account Number: [id number] Date of Birth/Sex: [birth year] (68 y.o. M) Treating RN: Army Melia Primary Care Ezme Duch: SYSTEM, PCP Other Clinician: Referring Cahlil Sattar:  Referral, Self Treating Zyairah Wacha/Extender: STONE III, [name] Weeks in Treatment: 7 Encounter Discharge Information Items Discharge Condition: Stable Ambulatory Status: Ambulatory Discharge Destination: Home Transportation: Private Auto Accompanied By: self Schedule Follow-up Appointment: Yes Clinical Summary of Care: Electronic Signature(s) Signed: 01/03/2019 10:01:51 AM By: Army Melia Entered By: Army Melia on 01/03/2019 10:01:51 [name] ([id number]) -------------------------------------------------------------------------------- Lower Extremity Assessment Details Patient Name: [name] Date of Service: 01/03/2019 9:00 AM Medical Record Number: [id number] Patient Account Number: [id number] Date of Birth/Sex: 10/18/1950 (68 y.o. M) Treating RN: Cornell Barman Primary Care Aadin Gaut: SYSTEM, PCP Other Clinician: Referring Nashira Mcglynn: Referral, Self Treating Comfort Iversen/Extender: STONE III, [name] Weeks in Treatment: 7 Edema Assessment Assessed: [Left: No] [Right: No] [Left: Edema] [Right: :] Calf Left: Right: Point of Measurement: 37 cm From Medial Instep 38.2 cm cm Ankle Left: Right: Point of Measurement: 13 cm From Medial Instep 26.6 cm cm Vascular Assessment Pulses: Dorsalis Pedis Palpable: [Left:Yes] Electronic Signature(s) Signed: 01/04/2019 9:32:15 AM By: Gretta Cool, BSN, RN, CWS, Kim RN, BSN Entered By: Gretta Cool, BSN, RN, CWS, Kim on 01/03/2019 09:27:03 [name] ([id number]) -------------------------------------------------------------------------------- Multi Wound Chart Details Patient Name: [name] Date of Service: 01/03/2019 9:00 AM Medical Record Number: [id number] Patient Account Number: [id number] Date of Birth/Sex: [birth year] (68 y.o. M) Treating RN: Army Melia Primary Care Hayzen Lorenson: SYSTEM, PCP Other Clinician: Referring Eli Adami: Referral, Self Treating Lucero Ide/Extender: STONE III, [name] Weeks in Treatment: 7 Vital Signs Height(in): 73 Pulse(bpm):  52 Weight(lbs): 300 Blood Pressure(mmHg): 128/57 Body Mass Index(BMI): 40 Temperature(F): 98.7 Respiratory Rate 18 (breaths/min): Photos: [N/A:N/A] Wound Location: Left, Lateral Lower Leg N/A N/A Wounding Event: Blister N/A N/A Primary Etiology: Diabetic Wound/Ulcer of the N/A N/A Lower Extremity Secondary Etiology: Venous Leg Ulcer N/A N/A Comorbid History: Hypertension, Peripheral N/A N/A Venous Disease, Type II Diabetes, Neuropathy, Received Chemotherapy Date Acquired: 10/29/2018 N/A N/A Weeks of Treatment: 7 N/A N/A Wound  Status: Healed - Epithelialized N/A N/A Wound Recurrence: Yes N/A N/A Clustered Wound: Yes N/A N/A Measurements L x W x D 0x0x0 N/A N/A (cm) Area (cm) : 0 N/A N/A Volume (cm) : 0 N/A N/A % Reduction in Area: 100.00% N/A N/A % Reduction in Volume: 100.00% N/A N/A Classification: Grade 1 N/A N/A Exudate Amount: None Present N/A N/A Wound Margin: Indistinct, nonvisible N/A N/A Granulation Amount: None Present (0%) N/A N/A Necrotic Amount: None Present (0%) N/A N/A Exposed Structures: Fascia: No N/A N/A Fat Layer (Subcutaneous Tissue) Exposed: No Tendon: No [name], [name] ([id number]) Muscle: No Joint: No Bone: No Epithelialization: Large (67-100%) N/A N/A Treatment Notes Electronic Signature(s) Signed: 01/03/2019 10:00:38 AM By: Army Melia Entered By: Army Melia on 01/03/2019 10:00:37 [name] ([id number]) -------------------------------------------------------------------------------- Multi-Disciplinary Care Plan Details Patient Name: [name] Date of Service: 01/03/2019 9:00 AM Medical Record Number: [id number] Patient Account Number: [id number] Date of Birth/Sex: Dec 02, 1950 (68 y.o. M) Treating RN: Army Melia Primary Care Briahnna Harries: SYSTEM, PCP Other Clinician: Referring Kidada Ging: Referral, Self Treating Marshall Roehrich/Extender: STONE III, [name] Weeks in Treatment: 7 Active Inactive Electronic Signature(s) Signed: 01/03/2019  10:00:06 AM By: Army Melia Entered By: Army Melia on 01/03/2019 10:00:05 [name] ([id number]) -------------------------------------------------------------------------------- Pain Assessment Details Patient Name: [name] Date of Service: 01/03/2019 9:00 AM Medical Record Number: [id number] Patient Account Number: [id number] Date of Birth/Sex: January 03, 1951 (68 y.o. M) Treating RN: Cornell Barman Primary Care Scherry Laverne: SYSTEM, PCP Other Clinician: Referring Damaria Stofko: Referral, Self Treating River Ambrosio/Extender: STONE III, [name] Weeks in Treatment: 7 Active Problems Location of Pain Severity and Description of Pain Patient Has Paino No Site Locations Pain Management and Medication Current Pain Management: Notes Patient denies pain at this time. Electronic Signature(s) Signed: 01/04/2019 9:32:15 AM By: Gretta Cool, BSN, RN, CWS, Kim RN, BSN Entered By: Gretta Cool, BSN, RN, CWS, Kim on 01/03/2019 09:15:05 [name] ([id number]) -------------------------------------------------------------------------------- Patient/Caregiver Education Details Patient Name: [name] Date of Service: 01/03/2019 9:00 AM Medical Record Number: [id number] Patient Account Number: [id number] Date of Birth/Gender: 09-Apr-1951 (68 y.o. M) Treating RN: Army Melia Primary Care Physician: SYSTEM, PCP Other Clinician: Referring Physician: Referral, Self Treating Physician/Extender: Melburn Hake, [name] Weeks in Treatment: 7 Education Assessment Education Provided To: Patient Education Topics Provided Wound/Skin Impairment: Handouts: Caring for Your Ulcer Methods: Demonstration, Explain/Verbal Responses: State content correctly Electronic Signature(s) Signed: 01/03/2019 11:49:33 AM By: Army Melia Entered By: Army Melia on 01/03/2019 10:01:32 [name] ([id number]) -------------------------------------------------------------------------------- Wound Assessment Details Patient Name: [name] Date  of Service: 01/03/2019 9:00 AM Medical Record Number: [id number] Patient Account Number: [id number] Date of Birth/Sex: Sep 14, 1950 (68 y.o. M) Treating RN: Army Melia Primary Care Poonam Woehrle: SYSTEM, PCP Other Clinician: Referring Kassidee Narciso: Referral, Self Treating Fard Borunda/Extender: STONE III, [name] Weeks in Treatment: 7 Wound Status Wound Number: [id number] Primary Diabetic Wound/Ulcer of the Lower Extremity Etiology: Wound Location: Left, Lateral Lower Leg Secondary Venous Leg Ulcer Wounding Event: Blister Etiology: Date Acquired: 10/29/2018 Wound Healed - Epithelialized Weeks Of Treatment: 7 Status: Clustered Wound: Yes Comorbid Hypertension, Peripheral Venous Disease, History: Type II Diabetes, Neuropathy, Received Chemotherapy Photos Wound Measurements Length: (cm) 0 % Reduct Width: (cm) 0 % Reduct Depth: (cm) 0 Epitheli Area: (cm) 0 Tunneli Volume: (cm) 0 Undermi ion in Area: 100% ion in Volume: 100% alization: Large (67-100%) ng: No ning: No Wound Description Classification: Grade 1 Wound Margin: Indistinct, nonvisible Exudate Amount: None Present Foul Odor After Cleansing: No Slough/Fibrino No Wound Bed Granulation Amount: None Present (0%) Exposed Structure Necrotic Amount: None Present (0%) Fascia Exposed: No Fat Layer (  Subcutaneous Tissue) Exposed: No Tendon Exposed: No Muscle Exposed: No Joint Exposed: No Bone Exposed: No Electronic Signature(s) [name], [name] ([id number]) Signed: 01/03/2019 11:49:33 AM By: Army Melia Signed: 01/06/2019 6:59:38 PM By: Worthy Keeler PA-C Entered By: Worthy Keeler on 01/03/2019 09:36:34 [name] ([id number]) -------------------------------------------------------------------------------- [place] Details Patient Name: [name] Date of Service: 01/03/2019 9:00 AM Medical Record Number: [id number] Patient Account Number: [id number] Date of Birth/Sex: [birth year] (69 y.o. M) Treating RN: Cornell Barman Primary Care Dai Apel:  SYSTEM, PCP Other Clinician: Referring Harjas Biggins: Referral, Self Treating Noelle Hoogland/Extender: STONE III, [name] Weeks in Treatment: 7 Vital Signs Time Taken: 09:15 Temperature (F): 98.7 Height (in): 73 Pulse (bpm): 52 Weight (lbs): 300 Respiratory Rate (breaths/min): 18 Body Mass Index (BMI): 39.6 Blood Pressure (mmHg): 128/57 Reference Range: 80 - 120 mg / dl Electronic Signature(s) Signed: 01/04/2019 9:32:15 AM By: Gretta Cool, BSN, RN, CWS, Kim RN, BSN Entered By: Gretta Cool, BSN, RN, CWS, Kim on 01/03/2019 09:15:31

## 2019-01-15 ENCOUNTER — Other Ambulatory Visit: Payer: Self-pay

## 2019-01-15 ENCOUNTER — Encounter: Payer: PPO | Attending: Physician Assistant | Admitting: Physician Assistant

## 2019-01-15 DIAGNOSIS — L97822 Non-pressure chronic ulcer of other part of left lower leg with fat layer exposed: Secondary | ICD-10-CM | POA: Insufficient documentation

## 2019-01-15 DIAGNOSIS — I2510 Atherosclerotic heart disease of native coronary artery without angina pectoris: Secondary | ICD-10-CM | POA: Diagnosis not present

## 2019-01-15 DIAGNOSIS — E1142 Type 2 diabetes mellitus with diabetic polyneuropathy: Secondary | ICD-10-CM | POA: Diagnosis not present

## 2019-01-15 DIAGNOSIS — I890 Lymphedema, not elsewhere classified: Secondary | ICD-10-CM | POA: Insufficient documentation

## 2019-01-15 DIAGNOSIS — Z809 Family history of malignant neoplasm, unspecified: Secondary | ICD-10-CM | POA: Insufficient documentation

## 2019-01-15 DIAGNOSIS — I509 Heart failure, unspecified: Secondary | ICD-10-CM | POA: Diagnosis not present

## 2019-01-15 DIAGNOSIS — E11622 Type 2 diabetes mellitus with other skin ulcer: Secondary | ICD-10-CM | POA: Insufficient documentation

## 2019-01-15 DIAGNOSIS — I110 Hypertensive heart disease with heart failure: Secondary | ICD-10-CM | POA: Insufficient documentation

## 2019-01-15 DIAGNOSIS — L97222 Non-pressure chronic ulcer of left calf with fat layer exposed: Secondary | ICD-10-CM | POA: Diagnosis not present

## 2019-01-15 DIAGNOSIS — Z87891 Personal history of nicotine dependence: Secondary | ICD-10-CM | POA: Insufficient documentation

## 2019-01-15 DIAGNOSIS — I878 Other specified disorders of veins: Secondary | ICD-10-CM | POA: Diagnosis not present

## 2019-01-15 DIAGNOSIS — E1151 Type 2 diabetes mellitus with diabetic peripheral angiopathy without gangrene: Secondary | ICD-10-CM | POA: Insufficient documentation

## 2019-01-15 DIAGNOSIS — Z856 Personal history of leukemia: Secondary | ICD-10-CM | POA: Diagnosis not present

## 2019-01-15 NOTE — Progress Notes (Signed)
LAGUAN, BADO ([id number]) Visit Report for 01/15/2019 Arrival Information Details Patient Name: [name], [name] Date of Service: 01/15/2019 10:45 AM Medical Record Number: [id number] Patient Account Number: [id number] Date of Birth/Sex: 12-27-1950 (68 y.o. M) Treating RN: Harold Barban Primary Care Trena Dunavan: SYSTEM, PCP Other Clinician: Referring Adeli Frost: Referral, Self Treating Lillybeth Tal/Extender: STONE III, [name] Weeks in Treatment: 9 Visit Information History Since Last Visit Added or deleted any medications: No Patient Arrived: Ambulatory Any new allergies or adverse reactions: No Arrival Time: 10:48 Had a fall or experienced change in No Accompanied By: self activities of daily living that may affect Transfer Assistance: None risk of falls: Patient Identification Verified: Yes Signs or symptoms of abuse/neglect since last visito No Secondary Verification Process Completed: Yes Has Dressing in Place as Prescribed: No Patient Has Alerts: Yes Pain Present Now: Yes Patient Alerts: DMII Electronic Signature(s) Signed: 01/15/2019 4:18:38 PM By: Harold Barban Entered By: Harold Barban on 01/15/2019 10:49:48 [name] ([id number]) -------------------------------------------------------------------------------- Compression Therapy Details Patient Name: [name] Date of Service: 01/15/2019 10:45 AM Medical Record Number: [id number] Patient Account Number: [id number] Date of Birth/Sex: 1950/08/01 (68 y.o. M) Treating RN: Montey Hora Primary Care Garry Bochicchio: SYSTEM, PCP Other Clinician: Referring Florance Paolillo: Referral, Self Treating Ade Stmarie/Extender: STONE III, [name] Weeks in Treatment: 9 Compression Therapy Performed for Wound Assessment: Wound #8 Left,Posterior Lower Leg Performed By: Clinician Montey Hora, RN Compression Type: Three Layer Pre Treatment ABI: 1.5 Post Procedure Diagnosis Same as Pre-procedure Electronic Signature(s) Signed: 01/15/2019 4:33:06 PM By: Montey Hora Entered By: Montey Hora on 01/15/2019 11:32:53 [name] ([id number]) -------------------------------------------------------------------------------- Encounter Discharge Information Details Patient Name: [name] Date of Service: 01/15/2019 10:45 AM Medical Record Number: [id number] Patient Account Number: [id number] Date of Birth/Sex: 11-13-1950 (68 y.o. M) Treating RN: Montey Hora Primary Care Jamin Humphries: SYSTEM, PCP Other Clinician: Referring Anis Cinelli: Referral, Self Treating Krizia Flight/Extender: STONE III, [name] Weeks in Treatment: 9 Encounter Discharge Information Items Discharge Condition: Stable Ambulatory Status: Ambulatory Discharge Destination: Home Transportation: Private Auto Accompanied By: self Schedule Follow-up Appointment: Yes Clinical Summary of Care: Electronic Signature(s) Signed: 01/15/2019 4:33:06 PM By: Montey Hora Entered By: Montey Hora on 01/15/2019 11:41:42 [name] ([id number]) -------------------------------------------------------------------------------- Lower Extremity Assessment Details Patient Name: [name] Date of Service: 01/15/2019 10:45 AM Medical Record Number: [id number] Patient Account Number: [id number] Date of Birth/Sex: 1951/01/25 (68 y.o. M) Treating RN: Harold Barban Primary Care Vali Capano: SYSTEM, PCP Other Clinician: Referring Sonoma Firkus: Referral, Self Treating Havier Deeb/Extender: STONE III, [name] Weeks in Treatment: 9 Edema Assessment Assessed: [Left: No] [Right: No] Edema: [Left: N] [Right: o] Vascular Assessment Pulses: Dorsalis Pedis Palpable: [Left:Yes] Electronic Signature(s) Signed: 01/15/2019 4:18:38 PM By: Harold Barban Entered By: Harold Barban on 01/15/2019 10:55:42 [name] ([id number]) -------------------------------------------------------------------------------- Multi Wound Chart Details Patient Name: [name] Date of Service: 01/15/2019 10:45 AM Medical Record Number:  [id number] Patient Account Number: [id number] Date of Birth/Sex: 1951/02/09 (67 y.o. M) Treating RN: Montey Hora Primary Care Yaneisy Wenz: SYSTEM, PCP Other Clinician: Referring Rajni Holsworth: Referral, Self Treating Myiah Petkus/Extender: STONE III, [name] Weeks in Treatment: 9 Vital Signs Height(in): 73 Pulse(bpm): 19 Weight(lbs): 300 Blood Pressure(mmHg): 137/58 Body Mass Index(BMI): 40 Temperature(F): 98.4 Respiratory Rate 18 (breaths/min): Photos: [N/A:N/A] Wound Location: Left Lower Leg - Posterior N/A N/A Wounding Event: Gradually Appeared N/A N/A Primary Etiology: Diabetic Wound/Ulcer of the N/A N/A Lower Extremity Comorbid History: Hypertension, Peripheral N/A N/A Venous Disease, Type II Diabetes, Neuropathy, Received Chemotherapy Date Acquired: 01/01/2019 N/A N/A Weeks of Treatment: 0 N/A N/A Wound Status: Open N/A N/A Measurements L x W x D 1.7x2x0.1 N/A  N/A (cm) Area (cm) : 2.67 N/A N/A Volume (cm) : 0.267 N/A N/A Classification: Grade 2 N/A N/A Exudate Amount: Medium N/A N/A Exudate Type: Serosanguineous N/A N/A Exudate Color: red, brown N/A N/A Wound Margin: Flat and Intact N/A N/A Granulation Amount: Small (1-33%) N/A N/A Granulation Quality: Red N/A N/A Necrotic Amount: Medium (34-66%) N/A N/A Exposed Structures: Fat Layer (Subcutaneous N/A N/A Tissue) Exposed: Yes Fascia: No Tendon: No Muscle: No [name], [name] ([id number]) Joint: No Bone: No Epithelialization: None N/A N/A Treatment Notes Electronic Signature(s) Signed: 01/15/2019 4:33:06 PM By: Montey Hora Entered By: Montey Hora on 01/15/2019 11:27:19 [name] ([id number]) -------------------------------------------------------------------------------- Multi-Disciplinary Care Plan Details Patient Name: [name] Date of Service: 01/15/2019 10:45 AM Medical Record Number: [id number] Patient Account Number: [id number] Date of Birth/Sex: Jun 27, 1950 (68 y.o. M) Treating RN: Montey Hora Primary Care Maeve Debord: SYSTEM, PCP Other Clinician: Referring Brendan Gruwell: Referral, Self Treating Mahnoor Mathisen/Extender: STONE III, [name] Weeks in Treatment: 9 Active Inactive Venous Leg Ulcer Nursing Diagnoses: Actual venous Insuffiency (use after diagnosis is confirmed) Knowledge deficit related to disease process and management Goals: Patient will maintain optimal edema control Date Initiated: 11/12/2018 Target Resolution Date: 03/08/2019 Goal Status: Active Patient/caregiver will verbalize understanding of disease process and disease management Date Initiated: 11/12/2018 Target Resolution Date: 03/08/2019 Goal Status: Active Interventions: Assess peripheral edema status every visit. Compression as ordered Provide education on venous insufficiency Notes: Wound/Skin Impairment Nursing Diagnoses: Impaired tissue integrity Knowledge deficit related to ulceration/compromised skin integrity Goals: Ulcer/skin breakdown will have a volume reduction of 30% by week 4 Date Initiated: 11/12/2018 Target Resolution Date: 03/08/2019 Goal Status: Active Interventions: Assess patient/caregiver ability to obtain necessary supplies Assess patient/caregiver ability to perform ulcer/skin care regimen upon admission and as needed Assess ulceration(s) every visit Provide education on ulcer and skin care Notes: [name], [name] ([id number]) Electronic Signature(s) Signed: 01/15/2019 4:33:06 PM By: Montey Hora Entered By: Montey Hora on 01/15/2019 11:27:04 [name] ([id number]) -------------------------------------------------------------------------------- Pain Assessment Details Patient Name: [name] Date of Service: 01/15/2019 10:45 AM Medical Record Number: [id number] Patient Account Number: [id number] Date of Birth/Sex: 1951/04/25 (68 y.o. M) Treating RN: Harold Barban Primary Care Abron Neddo: SYSTEM, PCP Other Clinician: Referring Ambrosia Wisnewski: Referral, Self Treating  Diamante Rubin/Extender: STONE III, [name] Weeks in Treatment: 9 Active Problems Location of Pain Severity and Description of Pain Patient Has Paino Yes Site Locations Rate the pain. Current Pain Level: 3 Pain Management and Medication Current Pain Management: Electronic Signature(s) Signed: 01/15/2019 4:18:38 PM By: Harold Barban Entered By: Harold Barban on 01/15/2019 10:49:59 [name] ([id number]) -------------------------------------------------------------------------------- Wound Assessment Details Patient Name: [name] Date of Service: 01/15/2019 10:45 AM Medical Record Number: [id number] Patient Account Number: [id number] Date of Birth/Sex: November 05, 1950 (68 y.o. M) Treating RN: Harold Barban Primary Care Nishaan Stanke: SYSTEM, PCP Other Clinician: Referring Shiron Whetsel: Referral, Self Treating Nigel Wessman/Extender: STONE III, [name] Weeks in Treatment: 9 Wound Status Wound Number: 8 Primary Diabetic Wound/Ulcer of the Lower Extremity Etiology: Wound Location: Left Lower Leg - Posterior Wound Open Wounding Event: Gradually Appeared Status: Date Acquired: 01/01/2019 Comorbid Hypertension, Peripheral Venous Disease, Type Weeks Of Treatment: 0 History: II Diabetes, Neuropathy, Received Clustered Wound: No Chemotherapy Photos Wound Measurements Length: (cm) 1.7 % Reduction in Width: (cm) 2 % Reduction in Depth: (cm) 0.1 Epithelializat Area: (cm) 2.67 Tunneling: Volume: (cm) 0.267 Undermining: Area: Volume: ion: None No No Wound Description Classification: Grade 2 Foul Odor Aft Wound Margin: Flat and Intact Slough/Fibrin Exudate Amount: Medium Exudate Type: Serosanguineous Exudate Color: red, brown er Cleansing: No o Yes Wound Bed Granulation  Amount: Small (1-33%) Exposed Structure Granulation Quality: Red Fascia Exposed: No Necrotic Amount: Medium (34-66%) Fat Layer (Subcutaneous Tissue) Exposed: Yes Necrotic Quality: Adherent Slough Tendon Exposed:  No Muscle Exposed: No Joint Exposed: No Bone Exposed: No Treatment Notes [name], [name] ([id number]) Wound #8 (Left, Posterior Lower Leg) Notes hydrofera blue, ABD, 3 layer Electronic Signature(s) Signed: 01/15/2019 4:18:38 PM By: Harold Barban Entered By: Harold Barban on 01/15/2019 10:54:53 [name] ([id number]) -------------------------------------------------------------------------------- Dresser Details Patient Name: [name] Date of Service: 01/15/2019 10:45 AM Medical Record Number: [id number] Patient Account Number: [id number] Date of Birth/Sex: Sep 23, 1950 (68 y.o. M) Treating RN: Harold Barban Primary Care Dhilan Brauer: SYSTEM, PCP Other Clinician: Referring Yazlynn Birkeland: Referral, Self Treating Asaf Elmquist/Extender: STONE III, [name] Weeks in Treatment: 9 Vital Signs Time Taken: 10:40 Temperature (F): 98.4 Height (in): 73 Pulse (bpm): 59 Weight (lbs): 300 Respiratory Rate (breaths/min): 18 Body Mass Index (BMI): 39.6 Blood Pressure (mmHg): 137/58 Reference Range: 80 - 120 mg / dl Electronic Signature(s) Signed: 01/15/2019 4:18:38 PM By: Harold Barban Entered By: Harold Barban on 01/15/2019 10:52:03

## 2019-01-15 NOTE — Progress Notes (Addendum)
KAMIN, WAYE ([id number]) Visit Report for 01/15/2019 Chief Complaint Document Details Patient Name: [name], Mooney Date of Service: 01/15/2019 10:45 AM Medical Record Number: [id number] Patient Account Number: [id number] Date of Birth/Sex: 06/21/1950 (68 y.o. M) Treating RN: Montey Hora Primary Care Provider: SYSTEM, PCP Other Clinician: Referring Provider: Referral, Self Treating Provider/Extender: Melburn Hake, [name] Weeks in Treatment: 9 Information Obtained from: Patient Chief Complaint Left LE ulcers Electronic Signature(s) Signed: 01/15/2019 10:59:07 AM By: Worthy Keeler PA-C Entered By: Worthy Keeler on 01/15/2019 10:59:06 [name] ([id number]) -------------------------------------------------------------------------------- HPI Details Patient Name: [name] Date of Service: 01/15/2019 10:45 AM Medical Record Number: [id number] Patient Account Number: [id number] Date of Birth/Sex: 1951-02-07 (68 y.o. M) Treating RN: Montey Hora Primary Care Provider: SYSTEM, PCP Other Clinician: Referring Provider: Referral, Self Treating Provider/Extender: STONE III, [name] Weeks in Treatment: 9 History of Present Illness HPI Description: 68 year old male who presented to the ER with bilateral lower extremity blisters which had started last week. he has a past medical history of leukemia, diabetes mellitus, hypertension, edema of both lower extremities, his recurrent skin infections, peripheral vascular disease, coronary artery disease, congestive heart failure and peripheral neuropathy. in the ER he was given Rocephin and put on Silvadene cream. he was put on oral doxycycline and was asked to follow-up with the [facility]. His last hemoglobin A1c was 6.6 in December and he checks his blood sugar once a week. He does not have any physicians outside the [place] system. He does not recall any vascular duplex studies done either for arterial or venous disease but was told to wear  compression stockings which he does not use 05/30/2016 -- we have not yet received any of his notes from the [facility] hospital system and his arterial and venous duplex studies are scheduled here in [place] around mid February. We are unable to have his insurance accepted by home health agencies and hence he is getting dressings only once a week. 06/06/16 -- -- I received a call from the patient's PCP at the [facility] and spoke to Dr. Garvin Fila, phone number [phone] and fax number [phone]. She confirmed that no vascular testing was done over the last 5 years and she would be happy to do them if the patient did want them to be done at the [place] and we could fax him a request. Readmission: 68 year old male seen by as in February of this year and was referred to vein and vascular for studies and opinion from the vascular surgeons. The patient returns today with a fresh problem having had blisters on his left lower extremity which have been there for about 5 days and he clearly states that he has been wearing his compression stockings as advised though he could not read the moderate compression and has been wearing light compression. Review of his electronic medical records note that he had lower extremity arterial duplex examination done on 06/23/2016 which showed no hemodynamically significant stenosis in the bilateral lower extremity arterial system. He also had a lower extremity venous reflux examination done on 07/07/2016 and it was noted that he had venous incompetence in the right great saphenous vein and bilateral common femoral veins. Patient was seen by Dr. Tamala Julian on the same day and for some reason his notes do not reflect the venous studies or the arterial studies and he recommended patient do a venous duplex ultrasound to look for reflux and return to see him.he would also consider a lymph pump if required. The patient was told that  his workup was normal and hence the  patient canceled his follow-up appointment. 02/03/17 on evaluation today patient left medial lower extremity blister appears to be doing about the same. It is still continuing to drain and there's still the blistered skin covering the wound bed which is making it difficult for the alternate to do its job. Fortunately there is no evidence of cellulitis. No fevers chills noted. Patient states in general he is not having any significant discomfort. Patient's lower extremity arterial duplex exam revealed that patient was hemodynamically stable with no evidence of stenosis in regard to the bilateral lower extremities. The lower extremity venous reflux exam revealed the patient had venous incontinence noted in the right greater saphenous and bilateral common femoral vein. There is no evidence of deep or superficial vein thrombosis in the bilateral lower extremities. Readmission: [name], [name] ([id number]) 11/12/18 Patient presents for evaluation our clinic today concerning issues that he is having with his left lower extremity. He tells me that a couple weeks ago he began developing blisters on the left lower extremity along with increased swelling. He typically wears his compression stockings on a regular basis is previously been evaluated both here as well is with vascular surgery they would recommend lymphedema pumps but unfortunately that somehow fell through and he never heard anything back from that. Nonetheless I think lymphedema pumps would be beneficial for this patient. He does have a history of hypertension and diabetes. Obviously the chronic venous stasis and lymphedema as well. At this point the blisters have been given in more trouble he states sometimes when the blisters openings able to clean it down with alcohol and it will dry out and do well. Unfortunately that has not been the case this time. He is having some discomfort although this mean these with cleaning the areas he doesn't have  discomfort just on a regular basis. He has not been able to wear his compression stockings since the blisters arose due to the fact that of course it will drain into the socks causing additional issues and he didn't have any way to wrap this otherwise. He has increased to taking his Lasix every day instead of every other day. He sees his primary care provider later this month as well. No fevers, chills, nausea, or vomiting noted at this time. 11/19/18-Patient returns at 1 week, per intake RN the amount of seepage into the compression wraps was definitely improved, overall all the wounds are measuring smaller but continuing silver alginate to the wounds as primary dressing 11/26/18 on evaluation today patient appears to be doing quite well in regard to his left lower Trinity ulcers. In fact of the areas that were noted initially he only has two regions still open. There is no evidence of active infection at this time. He still is not heard anything from the company regarding lymphedema pumps as of yet. Again as previously seen vascular they have not recommended any surgical intervention. 12/03/2018 on evaluation today patient actually appears to be doing quite well with regard to his lower extremity ulcers. In fact most of the areas appear to be healed the one spot which does not seem to be completely healed I am unsure of whether or not this is really draining that much but nonetheless there does not appear to be any signs of infection or significant drainage at this point. There is no sign of fever, chills, nausea, vomiting, or diarrhea. Overall I am pleased with how things have progressed I think is very close  to being able to transition to his home compression stockings. 12/10/2018 upon evaluation today patient appears to be doing quite well with regard to his left lower extremity. He has been tolerating the dressing changes without complication. Fortunately there is no signs of active infection at  this time. He appears after thorough evaluation of his leg to only have 1 small area that remains open at this point everything else appears to be almost completely closed. He still have significant swelling of the left lower extremity. We had discussed discussing this with his primary care provider he is not able to see her in person they were at the [facility] and right now the [place] is not seeing patients on site. According to the patient anyway. Subsequently he did speak with her apparently and his primary care provider feels that he may likely have a DVT. With that being said she has not seen his leg she is just going off of his history. Nonetheless that is a concern that the patient now has as well and while I do not feel the DVT is likely we can definitely ensure that that is not the case I will go ahead and see about putting that order in today. Nonetheless otherwise I am in a recommend that we continue with the current wound care measures including the compression therapy most likely. We just need to ensure that his leg is indeed free of any DVTs. 12/17/2018 on evaluation today patient actually appears to be completely healed today. He does have 2 very small areas of blistering although this is not anything too significant at this point which is good news. With that being said I am in agreement with the fact that I think he is completely healed at this point. He does want to get back into his compression stocking. The good news is we have gotten approval from insurance for his lymphedema pumps we received a letter since last saw him last week. The other good news is his study did come back and showed no evidence of a DVT. 12/20/2018 on evaluation today patient presents for follow-up concerning his ongoing issues with his left lower extremity. He was actually discharged last Friday and did fairly well until he states blisters opened this morning. He tells me he has been wearing his compression  stocking although he has a hard time getting this on. There does not appear to be any signs of active infection at this time. No fevers, chills, nausea, vomiting, or diarrhea. 12/27/2018 on evaluation today patient appears to be doing very well with regard to his swelling of the left lower extremity the 4 layer compression wrap seems to have been beneficial for him. Fortunately there is no signs of active infection at this time. Patient has been tolerating the compression wrap without complication and his foot swelling in particular appears to be greatly improved. He does still have a wound on the lateral portion of his left leg I believe this is more of a blister that has now reopened. 01/03/2019 on evaluation today patient actually appears to be doing excellent in regard to his left lower extremity. He did receive his compression pumps and is actually use this 7 times since he was last here in the office. On top of the compression wrap he is now roughly 3 cm better at the calf and 2 cm better at the ankle he also states that his foot seem to go an issue better without even having to use a shoe horn.  Obviously I think this is all evidence that he is doing excellent in this regard. The other good news is he does not appear to have anything open today as far as wounds are concerned. JAVAUGHN, GERREN ([id number]) 01/15/2019 on evaluation today patient appears to be doing more poorly yet again with regard to his left lower extremity. He has developed new wounds again after being discharged just recently. Unfortunately this continues to be the case that he will heal and then have subsequent new wounds. The last time I was hopeful that he may not end up coming back too quickly especially since he states he has been using his lymphedema pumps along with wearing his compression. Nonetheless he had a blister on the back of his leg that popped up on the left and this has opened up into an ulceration it is quite  painful. Electronic Signature(s) Signed: 01/15/2019 5:20:35 PM By: Worthy Keeler PA-C Entered By: Worthy Keeler on 01/15/2019 17:20:35 [name] ([id number]) -------------------------------------------------------------------------------- Physical Exam Details Patient Name: [name] Date of Service: 01/15/2019 10:45 AM Medical Record Number: [id number] Patient Account Number: [id number] Date of Birth/Sex: 02-23-1951 (68 y.o. M) Treating RN: Montey Hora Primary Care Provider: SYSTEM, PCP Other Clinician: Referring Provider: Referral, Self Treating Provider/Extender: STONE III, [name] Weeks in Treatment: 9 Constitutional Well-nourished and well-hydrated in no acute distress. Respiratory normal breathing without difficulty. clear to auscultation bilaterally. Cardiovascular regular rate and rhythm with normal S1, S2. Psychiatric this patient is able to make decisions and demonstrates good insight into disease process. Alert and Oriented x 3. pleasant and cooperative. Notes Patient's wound bed currently showed signs of granulation there did not appear to be evidence of active infection which is good news. Fortunately there is no signs of systemic infection either. Overall his edema seems to be fairly well-controlled I do believe it sounds like he has been using his pumps which is good he is also been wearing the compression unfortunately she is not seeming to be quite enough as of yet. He has compression socks that he is currently utilizing. I think he may benefit from [place] looking into Velcro compression wraps for him. Electronic Signature(s) Signed: 01/15/2019 5:21:09 PM By: Worthy Keeler PA-C Entered By: Worthy Keeler on 01/15/2019 17:21:09 [name] ([id number]) -------------------------------------------------------------------------------- Physician Orders Details Patient Name: [name] Date of Service: 01/15/2019 10:45 AM Medical Record Number: [id number] Patient  Account Number: [id number] Date of Birth/Sex: 03-18-1951 (68 y.o. M) Treating RN: Montey Hora Primary Care Provider: SYSTEM, PCP Other Clinician: Referring Provider: Referral, Self Treating Provider/Extender: STONE III, [name] Weeks in Treatment: 9 Verbal / Phone Orders: No Diagnosis Coding ICD-10 Coding Code Description E11.622 Type 2 diabetes mellitus with other skin ulcer I89.0 Lymphedema, not elsewhere classified I87.2 Venous insufficiency (chronic) (peripheral) L97.822 Non-pressure chronic ulcer of other part of left lower leg with fat layer exposed I10 Essential (primary) hypertension Wound Cleansing Wound #8 Left,Posterior Lower Leg o May shower with protection. - Please do not get your wraps wet Primary Wound Dressing Wound #8 Left,Posterior Lower Leg o Hydrafera Blue Ready Transfer Secondary Dressing Wound #8 Left,Posterior Lower Leg o ABD pad Dressing Change Frequency Wound #8 Left,Posterior Lower Leg o Change dressing every week Follow-up Appointments Wound #8 Left,Posterior Lower Leg o Return Appointment in 1 week. Edema Control Wound #8 Left,Posterior Lower Leg o 3 Layer Compression System - Left Lower Extremity Electronic Signature(s) Signed: 01/15/2019 4:33:06 PM By: Montey Hora Signed: 01/15/2019 5:39:49 PM By: Worthy Keeler PA-C Entered By: Marjory Lies,  Di Kindle on 01/15/2019 11:32:28 HANNON, CERA ([id number]) -------------------------------------------------------------------------------- Problem List Details Patient Name: CHIKA, WEB Date of Service: 01/15/2019 10:45 AM Medical Record Number: [id number] Patient Account Number: [id number] Date of Birth/Sex: 14-Oct-1950 (68 y.o. M) Treating RN: Montey Hora Primary Care Provider: SYSTEM, PCP Other Clinician: Referring Provider: Referral, Self Treating Provider/Extender: Melburn Hake, [name] Weeks in Treatment: 9 Active Problems ICD-10 Evaluated Encounter Code Description Active Date Today  Diagnosis E11.622 Type 2 diabetes mellitus with other skin ulcer 11/12/2018 No Yes I89.0 Lymphedema, not elsewhere classified 11/12/2018 No Yes I87.2 Venous insufficiency (chronic) (peripheral) 11/12/2018 No Yes L97.822 Non-pressure chronic ulcer of other part of left lower leg with 11/12/2018 No Yes fat layer exposed I10 Essential (primary) hypertension 11/12/2018 No Yes Inactive Problems Resolved Problems Electronic Signature(s) Signed: 01/15/2019 10:58:49 AM By: Worthy Keeler PA-C Entered By: Worthy Keeler on 01/15/2019 10:58:49 [name] ([id number]) -------------------------------------------------------------------------------- Progress Note Details Patient Name: [name] Date of Service: 01/15/2019 10:45 AM Medical Record Number: [id number] Patient Account Number: [id number] Date of Birth/Sex: 1950-07-08 (68 y.o. M) Treating RN: Montey Hora Primary Care Provider: SYSTEM, PCP Other Clinician: Referring Provider: Referral, Self Treating Provider/Extender: STONE III, [name] Weeks in Treatment: 9 Subjective Chief Complaint Information obtained from Patient Left LE ulcers History of Present Illness (HPI) 68 year old male who presented to the ER with bilateral lower extremity blisters which had started last week. he has a past medical history of leukemia, diabetes mellitus, hypertension, edema of both lower extremities, his recurrent skin infections, peripheral vascular disease, coronary artery disease, congestive heart failure and peripheral neuropathy. in the ER he was given Rocephin and put on Silvadene cream. he was put on oral doxycycline and was asked to follow-up with the [facility]. His last hemoglobin A1c was 6.6 in December and he checks his blood sugar once a week. He does not have any physicians outside the [place] system. He does not recall any vascular duplex studies done either for arterial or venous disease but was told to wear compression stockings which he does not  use 05/30/2016 -- we have not yet received any of his notes from the [facility] hospital system and his arterial and venous duplex studies are scheduled here in [place] around mid February. We are unable to have his insurance accepted by home health agencies and hence he is getting dressings only once a week. 06/06/16 -- -- I received a call from the patient's PCP at the [facility] and spoke to Dr. Garvin Fila, phone number [phone] and fax number [phone]. She confirmed that no vascular testing was done over the last 5 years and she would be happy to do them if the patient did want them to be done at the [place] and we could fax him a request. Readmission: 68 year old male seen by as in February of this year and was referred to vein and vascular for studies and opinion from the vascular surgeons. The patient returns today with a fresh problem having had blisters on his left lower extremity which have been there for about 5 days and he clearly states that he has been wearing his compression stockings as advised though he could not read the moderate compression and has been wearing light compression. Review of his electronic medical records note that he had lower extremity arterial duplex examination done on 06/23/2016 which showed no hemodynamically significant stenosis in the bilateral lower extremity arterial system. He also had a lower extremity venous reflux examination done on 07/07/2016 and it was noted that he had  venous incompetence in the right great saphenous vein and bilateral common femoral veins. Patient was seen by Dr. Tamala Julian on the same day and for some reason his notes do not reflect the venous studies or the arterial studies and he recommended patient do a venous duplex ultrasound to look for reflux and return to see him.he would also consider a lymph pump if required. The patient was told that his workup was normal and hence the patient canceled his follow-up  appointment. 02/03/17 on evaluation today patient left medial lower extremity blister appears to be doing about the same. It is still continuing to drain and there's still the blistered skin covering the wound bed which is making it difficult for the alternate to do its job. Fortunately there is no evidence of cellulitis. No fevers chills noted. Patient states in general he is not having any significant discomfort. Patient's lower extremity arterial duplex exam revealed that patient was hemodynamically stable with no evidence of stenosis in regard to the bilateral lower extremities. The lower extremity venous reflux exam revealed the patient had venous incontinence noted in the right greater saphenous [place], [name] ([id number]) and bilateral common femoral vein. There is no evidence of deep or superficial vein thrombosis in the bilateral lower extremities. Readmission: 11/12/18 Patient presents for evaluation our clinic today concerning issues that he is having with his left lower extremity. He tells me that a couple weeks ago he began developing blisters on the left lower extremity along with increased swelling. He typically wears his compression stockings on a regular basis is previously been evaluated both here as well is with vascular surgery they would recommend lymphedema pumps but unfortunately that somehow fell through and he never heard anything back from that. Nonetheless I think lymphedema pumps would be beneficial for this patient. He does have a history of hypertension and diabetes. Obviously the chronic venous stasis and lymphedema as well. At this point the blisters have been given in more trouble he states sometimes when the blisters openings able to clean it down with alcohol and it will dry out and do well. Unfortunately that has not been the case this time. He is having some discomfort although this mean these with cleaning the areas he doesn't have discomfort just on a regular  basis. He has not been able to wear his compression stockings since the blisters arose due to the fact that of course it will drain into the socks causing additional issues and he didn't have any way to wrap this otherwise. He has increased to taking his Lasix every day instead of every other day. He sees his primary care provider later this month as well. No fevers, chills, nausea, or vomiting noted at this time. 11/19/18-Patient returns at 1 week, per intake RN the amount of seepage into the compression wraps was definitely improved, overall all the wounds are measuring smaller but continuing silver alginate to the wounds as primary dressing 11/26/18 on evaluation today patient appears to be doing quite well in regard to his left lower Trinity ulcers. In fact of the areas that were noted initially he only has two regions still open. There is no evidence of active infection at this time. He still is not heard anything from the company regarding lymphedema pumps as of yet. Again as previously seen vascular they have not recommended any surgical intervention. 12/03/2018 on evaluation today patient actually appears to be doing quite well with regard to his lower extremity ulcers. In fact most of the areas appear  to be healed the one spot which does not seem to be completely healed I am unsure of whether or not this is really draining that much but nonetheless there does not appear to be any signs of infection or significant drainage at this point. There is no sign of fever, chills, nausea, vomiting, or diarrhea. Overall I am pleased with how things have progressed I think is very close to being able to transition to his home compression stockings. 12/10/2018 upon evaluation today patient appears to be doing quite well with regard to his left lower extremity. He has been tolerating the dressing changes without complication. Fortunately there is no signs of active infection at this time. He appears after  thorough evaluation of his leg to only have 1 small area that remains open at this point everything else appears to be almost completely closed. He still have significant swelling of the left lower extremity. We had discussed discussing this with his primary care provider he is not able to see her in person they were at the [facility] and right now the [place] is not seeing patients on site. According to the patient anyway. Subsequently he did speak with her apparently and his primary care provider feels that he may likely have a DVT. With that being said she has not seen his leg she is just going off of his history. Nonetheless that is a concern that the patient now has as well and while I do not feel the DVT is likely we can definitely ensure that that is not the case I will go ahead and see about putting that order in today. Nonetheless otherwise I am in a recommend that we continue with the current wound care measures including the compression therapy most likely. We just need to ensure that his leg is indeed free of any DVTs. 12/17/2018 on evaluation today patient actually appears to be completely healed today. He does have 2 very small areas of blistering although this is not anything too significant at this point which is good news. With that being said I am in agreement with the fact that I think he is completely healed at this point. He does want to get back into his compression stocking. The good news is we have gotten approval from insurance for his lymphedema pumps we received a letter since last saw him last week. The other good news is his study did come back and showed no evidence of a DVT. 12/20/2018 on evaluation today patient presents for follow-up concerning his ongoing issues with his left lower extremity. He was actually discharged last Friday and did fairly well until he states blisters opened this morning. He tells me he has been wearing his compression stocking although he has a  hard time getting this on. There does not appear to be any signs of active infection at this time. No fevers, chills, nausea, vomiting, or diarrhea. 12/27/2018 on evaluation today patient appears to be doing very well with regard to his swelling of the left lower extremity the 4 layer compression wrap seems to have been beneficial for him. Fortunately there is no signs of active infection at this time. Patient has been tolerating the compression wrap without complication and his foot swelling in particular appears to be greatly improved. He does still have a wound on the lateral portion of his left leg I believe this is more of a blister that has now reopened. MJ, WALT ([id number]) 01/03/2019 on evaluation today patient actually appears to be  doing excellent in regard to his left lower extremity. He did receive his compression pumps and is actually use this 7 times since he was last here in the office. On top of the compression wrap he is now roughly 3 cm better at the calf and 2 cm better at the ankle he also states that his foot seem to go an issue better without even having to use a shoe horn. Obviously I think this is all evidence that he is doing excellent in this regard. The other good news is he does not appear to have anything open today as far as wounds are concerned. 01/15/2019 on evaluation today patient appears to be doing more poorly yet again with regard to his left lower extremity. He has developed new wounds again after being discharged just recently. Unfortunately this continues to be the case that he will heal and then have subsequent new wounds. The last time I was hopeful that he may not end up coming back too quickly especially since he states he has been using his lymphedema pumps along with wearing his compression. Nonetheless he had a blister on the back of his leg that popped up on the left and this has opened up into an ulceration it is quite painful. Patient  History Information obtained from Patient. Family History Cancer - Mother, Stroke - Father, No family history of Diabetes, Heart Disease, Hereditary Spherocytosis, Hypertension, Kidney Disease, Lung Disease, Seizures, Thyroid Problems, Tuberculosis. Social History Former smoker - quit 5 years ago, Marital Status - Divorced, Alcohol Use - Never, Drug Use - No History, Caffeine Use - Daily. Medical History Cardiovascular Patient has history of Hypertension, Peripheral Venous Disease Denies history of Angina, Arrhythmia, Congestive Heart Failure, Coronary Artery Disease, Deep Vein Thrombosis, Hypotension, Myocardial Infarction, Peripheral Arterial Disease, Phlebitis, Vasculitis Endocrine Patient has history of Type II Diabetes Integumentary (Skin) Denies history of History of Burn, History of pressure wounds Neurologic Patient has history of Neuropathy Denies history of Dementia, Quadriplegia, Paraplegia, Seizure Disorder Oncologic Patient has history of Received Chemotherapy Denies history of Received Radiation Medical And Surgical History Notes 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 Respiratory Denies complaints or symptoms of Chronic or frequent coughs, Shortness of Breath. Cardiovascular Complains or has symptoms of LE edema. Denies complaints or symptoms of Chest pain. Psychiatric Denies complaints or symptoms of Anxiety, Claustrophobia. [place], [name] ([id number]) Objective Constitutional Well-nourished and well-hydrated in no acute distress. Vitals Time Taken: 10:40 AM, Height: 73 in, Weight: 300 lbs, BMI: 39.6, Temperature: 98.4 F, Pulse: 59 bpm, Respiratory Rate: 18 breaths/min, Blood Pressure: 137/58 mmHg. Respiratory normal breathing without difficulty. clear to auscultation bilaterally. Cardiovascular regular rate  and rhythm with normal S1, S2. Psychiatric this patient is able to make decisions and demonstrates good insight into disease process. Alert and Oriented x 3. pleasant and cooperative. General Notes: Patient's wound bed currently showed signs of granulation there did not appear to be evidence of active infection which is good news. Fortunately there is no signs of systemic infection either. Overall his edema seems to be fairly well-controlled I do believe it sounds like he has been using his pumps which is good he is also been wearing the compression unfortunately she is not seeming to be quite enough as of yet. He has compression socks that he is currently utilizing. I think he may benefit from [place] looking into Velcro compression wraps for  him. Integumentary (Hair, Skin) Wound #8 status is Open. Original cause of wound was Gradually Appeared. The wound is located on the Left,Posterior Lower Leg. The wound measures 1.7cm length x 2cm width x 0.1cm depth; 2.67cm^2 area and 0.267cm^3 volume. There is Fat Layer (Subcutaneous Tissue) Exposed exposed. There is no tunneling or undermining noted. There is a medium amount of serosanguineous drainage noted. The wound margin is flat and intact. There is small (1-33%) red granulation within the wound bed. There is a medium (34-66%) amount of necrotic tissue within the wound bed including Adherent Slough. Assessment Active Problems ICD-10 Type 2 diabetes mellitus with other skin ulcer Lymphedema, not elsewhere classified Venous insufficiency (chronic) (peripheral) Non-pressure chronic ulcer of other part of left lower leg with fat layer exposed Essential (primary) hypertension Procedures Wound #8 [place], [name] ([id number]) Pre-procedure diagnosis of Wound #8 is a Diabetic Wound/Ulcer of the Lower Extremity located on the Left,Posterior Lower Leg . There was a Three Layer Compression Therapy Procedure with a pre-treatment ABI of 1.5 by Montey Hora,  RN. Post procedure Diagnosis Wound #8: Same as Pre-Procedure Plan Wound Cleansing: Wound #8 Left,Posterior Lower Leg: May shower with protection. - Please do not get your wraps wet Primary Wound Dressing: Wound #8 Left,Posterior Lower Leg: Hydrafera Blue Ready Transfer Secondary Dressing: Wound #8 Left,Posterior Lower Leg: ABD pad Dressing Change Frequency: Wound #8 Left,Posterior Lower Leg: Change dressing every week Follow-up Appointments: Wound #8 Left,Posterior Lower Leg: Return Appointment in 1 week. Edema Control: Wound #8 Left,Posterior Lower Leg: 3 Layer Compression System - Left Lower Extremity 1. I would recommend currently that we go ahead and see about ordering a Velcro compression wrap for the left lower extremity to try to keep the wounds under control and prevent anything from worsening. Obviously the goal is to stop these blisters from occurring as frequently as they are. 2. For the time being I will go ahead and initiate treatment yet again with a 3 layer compression wrap and we will utilize [facility] as the dressing of choice underneath this. 3. I still think the patient needs to continue to elevate his legs as well as use his lymphedema pumps on a regular basis as we have previously discussed he states he has been doing such and I do believe him I recommend he continue to do such. We will see patient back for reevaluation in 1 week here in the clinic. If anything worsens or changes patient will contact our office for additional recommendations. Electronic Signature(s) Signed: 01/15/2019 5:21:57 PM By: Worthy Keeler PA-C Entered By: Worthy Keeler on 01/15/2019 17:21:57 [name] ([id number]) -------------------------------------------------------------------------------- ROS/PFSH Details Patient Name: [name] Date of Service: 01/15/2019 10:45 AM Medical Record Number: [id number] Patient Account Number: [id number] Date of Birth/Sex: [birth year] (68  y.o. M) Treating RN: Montey Hora Primary Care Provider: SYSTEM, PCP Other Clinician: Referring Provider: Referral, Self Treating Provider/Extender: STONE III, [name] Weeks in Treatment: 9 Information Obtained From Patient Constitutional Symptoms (General Health) Complaints and Symptoms: Negative for: Fatigue; Fever; Chills; Marked Weight Change Respiratory Complaints and Symptoms: Negative for: Chronic or frequent coughs; Shortness of Breath Cardiovascular Complaints and Symptoms: Positive for: LE edema Negative for: Chest pain Medical History: Positive for: Hypertension; Peripheral Venous Disease Negative for: Angina; Arrhythmia; Congestive Heart Failure; Coronary Artery Disease; Deep Vein Thrombosis; Hypotension; Myocardial Infarction; Peripheral Arterial Disease; Phlebitis; Vasculitis Psychiatric Complaints and Symptoms: Negative for: Anxiety; Claustrophobia Endocrine Medical History: Positive for: Type II Diabetes Treated with: Oral agents Integumentary (Skin) Medical History: Negative for:  History of Burn; History of pressure wounds Neurologic Medical History: Positive for: Neuropathy Negative for: Dementia; Quadriplegia; Paraplegia; Seizure Disorder Oncologic Medical History: Positive for: Received Chemotherapy MALICAH, LOEZA ([id number]) Negative for: Received Radiation Past Medical History Notes: 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 Immunizations Pneumococcal Vaccine: Received Pneumococcal Vaccination: Yes Immunization Notes: up to date Implantable Devices None Family and Social History Cancer: Yes - Mother; Diabetes: No; Heart Disease: No; Hereditary Spherocytosis: No; Hypertension: No; Kidney Disease: No; Lung Disease: No; Seizures: No; Stroke: Yes - Father; Thyroid Problems: No; Tuberculosis: No; Former smoker - quit 5 years ago; Marital Status - Divorced; Alcohol Use: Never; Drug Use: No History; Caffeine Use: Daily; Financial Concerns: No; Food, Clothing  or Shelter Needs: No; Support System Lacking: No; Transportation Concerns: No Physician Affirmation I have reviewed and agree with the above information. Electronic Signature(s) Signed: 01/15/2019 5:39:49 PM By: Worthy Keeler PA-C Signed: 01/17/2019 4:59:38 PM By: Montey Hora Entered By: Worthy Keeler on 01/15/2019 17:20:53 [name] ([id number]) -------------------------------------------------------------------------------- SuperBill Details Patient Name: [name] Date of Service: 01/15/2019 Medical Record Number: [id number] Patient Account Number: [id number] Date of Birth/Sex: December 10, 1950 (68 y.o. M) Treating RN: Montey Hora Primary Care Provider: SYSTEM, PCP Other Clinician: Referring Provider: Referral, Self Treating Provider/Extender: STONE III, [name] Weeks in Treatment: 9 Diagnosis Coding ICD-10 Codes Code Description E11.622 Type 2 diabetes mellitus with other skin ulcer I89.0 Lymphedema, not elsewhere classified I87.2 Venous insufficiency (chronic) (peripheral) L97.822 Non-pressure chronic ulcer of other part of left lower leg with fat layer exposed I10 Essential (primary) hypertension Facility Procedures CPT4 Code: [id number] Description: (Facility Use Only) [phone] - APPLY [facility] LT LEG Modifier: Quantity: 1 Physician Procedures CPT4 Code Description: [id number] - WC PHYS LEVEL 4 - EST PT ICD-10 Diagnosis Description E11.622 Type 2 diabetes mellitus with other skin ulcer I89.0 Lymphedema, not elsewhere classified I87.2 Venous insufficiency (chronic) (peripheral) L97.822  Non-pressure chronic ulcer of other part of left lower leg wit Modifier: h fat layer expos Quantity: 1 ed Electronic Signature(s) Signed: 01/15/2019 5:22:26 PM By: Worthy Keeler PA-C Previous Signature: 01/15/2019 12:54:16 PM Version By: Montey Hora Entered By: Worthy Keeler on 01/15/2019 17:22:26

## 2019-01-16 DIAGNOSIS — E11621 Type 2 diabetes mellitus with foot ulcer: Secondary | ICD-10-CM | POA: Diagnosis not present

## 2019-01-22 ENCOUNTER — Encounter: Payer: PPO | Admitting: Physician Assistant

## 2019-01-22 ENCOUNTER — Other Ambulatory Visit: Payer: Self-pay

## 2019-01-22 DIAGNOSIS — E11622 Type 2 diabetes mellitus with other skin ulcer: Secondary | ICD-10-CM | POA: Diagnosis not present

## 2019-01-22 DIAGNOSIS — L97222 Non-pressure chronic ulcer of left calf with fat layer exposed: Secondary | ICD-10-CM | POA: Diagnosis not present

## 2019-01-22 NOTE — Progress Notes (Addendum)
[name], Mooney ([id number]) Visit Report for 01/22/2019 Chief Complaint Document Details Patient Name: [name], [name] Date of Service: 01/22/2019 9:00 AM Medical Record Number: [id number] Patient Account Number: [id number] Date of Birth/Sex: [birth year] (68 y.o. M) Treating RN: Montey Hora Primary Care Provider: SYSTEM, PCP Other Clinician: Referring Provider: Referral, Self Treating Provider/Extender: STONE III, [name] Weeks in Treatment: 10 Information Obtained from: Patient Chief Complaint Left LE ulcers Electronic Signature(s) Signed: 01/22/2019 9:08:39 AM By: Worthy Keeler PA-C Entered By: Worthy Keeler on 01/22/2019 09:08:39 [name] ([id number]) -------------------------------------------------------------------------------- HPI Details Patient Name: [name] Date of Service: 01/22/2019 9:00 AM Medical Record Number: [id number] Patient Account Number: [id number] Date of Birth/Sex: Oct 24, 1950 (68 y.o. M) Treating RN: Montey Hora Primary Care Provider: SYSTEM, PCP Other Clinician: Referring Provider: Referral, Self Treating Provider/Extender: STONE III, [name] Weeks in Treatment: 10 History of Present Illness HPI Description: 68 year old male who presented to the ER with bilateral lower extremity blisters which had started last week. he has a past medical history of leukemia, diabetes mellitus, hypertension, edema of both lower extremities, his recurrent skin infections, peripheral vascular disease, coronary artery disease, congestive heart failure and peripheral neuropathy. in the ER he was given Rocephin and put on Silvadene cream. he was put on oral doxycycline and was asked to follow-up with the [facility]. His last hemoglobin A1c was 6.6 in December and he checks his blood sugar once a week. He does not have any physicians outside the [place] system. He does not recall any vascular duplex studies done either for arterial or venous disease but was told to wear  compression stockings which he does not use 05/30/2016 -- we have not yet received any of his notes from the [facility] hospital system and his arterial and venous duplex studies are scheduled here in [place] around mid February. We are unable to have his insurance accepted by home health agencies and hence he is getting dressings only once a week. 06/06/16 -- -- I received a call from the patient's PCP at the [facility] and spoke to Dr. Garvin Fila, phone number [phone] and fax number [phone]. She confirmed that no vascular testing was done over the last 5 years and she would be happy to do them if the patient did want them to be done at the [place] and we could fax him a request. Readmission: 68 year old male seen by as in February of this year and was referred to vein and vascular for studies and opinion from the vascular surgeons. The patient returns today with a fresh problem having had blisters on his left lower extremity which have been there for about 5 days and he clearly states that he has been wearing his compression stockings as advised though he could not read the moderate compression and has been wearing light compression. Review of his electronic medical records note that he had lower extremity arterial duplex examination done on 06/23/2016 which showed no hemodynamically significant stenosis in the bilateral lower extremity arterial system. He also had a lower extremity venous reflux examination done on 07/07/2016 and it was noted that he had venous incompetence in the right great saphenous vein and bilateral common femoral veins. Patient was seen by Dr. Tamala Julian on the same day and for some reason his notes do not reflect the venous studies or the arterial studies and he recommended patient do a venous duplex ultrasound to look for reflux and return to see him.he would also consider a lymph pump if required. The patient was told that  his workup was normal and hence the  patient canceled his follow-up appointment. 02/03/17 on evaluation today patient left medial lower extremity blister appears to be doing about the same. It is still continuing to drain and there's still the blistered skin covering the wound bed which is making it difficult for the alternate to do its job. Fortunately there is no evidence of cellulitis. No fevers chills noted. Patient states in general he is not having any significant discomfort. Patient's lower extremity arterial duplex exam revealed that patient was hemodynamically stable with no evidence of stenosis in regard to the bilateral lower extremities. The lower extremity venous reflux exam revealed the patient had venous incontinence noted in the right greater saphenous and bilateral common femoral vein. There is no evidence of deep or superficial vein thrombosis in the bilateral lower extremities. Readmission: [name], [name] ([id number]) 11/12/18 Patient presents for evaluation our clinic today concerning issues that he is having with his left lower extremity. He tells me that a couple weeks ago he began developing blisters on the left lower extremity along with increased swelling. He typically wears his compression stockings on a regular basis is previously been evaluated both here as well is with vascular surgery they would recommend lymphedema pumps but unfortunately that somehow fell through and he never heard anything back from that. Nonetheless I think lymphedema pumps would be beneficial for this patient. He does have a history of hypertension and diabetes. Obviously the chronic venous stasis and lymphedema as well. At this point the blisters have been given in more trouble he states sometimes when the blisters openings able to clean it down with alcohol and it will dry out and do well. Unfortunately that has not been the case this time. He is having some discomfort although this mean these with cleaning the areas he doesn't have  discomfort just on a regular basis. He has not been able to wear his compression stockings since the blisters arose due to the fact that of course it will drain into the socks causing additional issues and he didn't have any way to wrap this otherwise. He has increased to taking his Lasix every day instead of every other day. He sees his primary care provider later this month as well. No fevers, chills, nausea, or vomiting noted at this time. 11/19/18-Patient returns at 1 week, per intake RN the amount of seepage into the compression wraps was definitely improved, overall all the wounds are measuring smaller but continuing silver alginate to the wounds as primary dressing 11/26/18 on evaluation today patient appears to be doing quite well in regard to his left lower Trinity ulcers. In fact of the areas that were noted initially he only has two regions still open. There is no evidence of active infection at this time. He still is not heard anything from the company regarding lymphedema pumps as of yet. Again as previously seen vascular they have not recommended any surgical intervention. 12/03/2018 on evaluation today patient actually appears to be doing quite well with regard to his lower extremity ulcers. In fact most of the areas appear to be healed the one spot which does not seem to be completely healed I am unsure of whether or not this is really draining that much but nonetheless there does not appear to be any signs of infection or significant drainage at this point. There is no sign of fever, chills, nausea, vomiting, or diarrhea. Overall I am pleased with how things have progressed I think is very close  to being able to transition to his home compression stockings. 12/10/2018 upon evaluation today patient appears to be doing quite well with regard to his left lower extremity. He has been tolerating the dressing changes without complication. Fortunately there is no signs of active infection at  this time. He appears after thorough evaluation of his leg to only have 1 small area that remains open at this point everything else appears to be almost completely closed. He still have significant swelling of the left lower extremity. We had discussed discussing this with his primary care provider he is not able to see her in person they were at the [facility] and right now the [place] is not seeing patients on site. According to the patient anyway. Subsequently he did speak with her apparently and his primary care provider feels that he may likely have a DVT. With that being said she has not seen his leg she is just going off of his history. Nonetheless that is a concern that the patient now has as well and while I do not feel the DVT is likely we can definitely ensure that that is not the case I will go ahead and see about putting that order in today. Nonetheless otherwise I am in a recommend that we continue with the current wound care measures including the compression therapy most likely. We just need to ensure that his leg is indeed free of any DVTs. 12/17/2018 on evaluation today patient actually appears to be completely healed today. He does have 2 very small areas of blistering although this is not anything too significant at this point which is good news. With that being said I am in agreement with the fact that I think he is completely healed at this point. He does want to get back into his compression stocking. The good news is we have gotten approval from insurance for his lymphedema pumps we received a letter since last saw him last week. The other good news is his study did come back and showed no evidence of a DVT. 12/20/2018 on evaluation today patient presents for follow-up concerning his ongoing issues with his left lower extremity. He was actually discharged last Friday and did fairly well until he states blisters opened this morning. He tells me he has been wearing his compression  stocking although he has a hard time getting this on. There does not appear to be any signs of active infection at this time. No fevers, chills, nausea, vomiting, or diarrhea. 12/27/2018 on evaluation today patient appears to be doing very well with regard to his swelling of the left lower extremity the 4 layer compression wrap seems to have been beneficial for him. Fortunately there is no signs of active infection at this time. Patient has been tolerating the compression wrap without complication and his foot swelling in particular appears to be greatly improved. He does still have a wound on the lateral portion of his left leg I believe this is more of a blister that has now reopened. 01/03/2019 on evaluation today patient actually appears to be doing excellent in regard to his left lower extremity. He did receive his compression pumps and is actually use this 7 times since he was last here in the office. On top of the compression wrap he is now roughly 3 cm better at the calf and 2 cm better at the ankle he also states that his foot seem to go an issue better without even having to use a shoe horn.  Obviously I think this is all evidence that he is doing excellent in this regard. The other good news is he does not appear to have anything open today as far as wounds are concerned. [name], [name] ([id number]) 01/15/2019 on evaluation today patient appears to be doing more poorly yet again with regard to his left lower extremity. He has developed new wounds again after being discharged just recently. Unfortunately this continues to be the case that he will heal and then have subsequent new wounds. The last time I was hopeful that he may not end up coming back too quickly especially since he states he has been using his lymphedema pumps along with wearing his compression. Nonetheless he had a blister on the back of his leg that popped up on the left and this has opened up into an ulceration it is quite  painful. 01/22/19 on evaluation today patient actually appears to be doing well with regard to his wound on the left lower extremity. He's been tolerating the dressing changes without complication including the compression wrap in the wound appears to be significantly smaller today which is great news. Overall very pleased in this regard. Electronic Signature(s) Signed: 01/24/2019 2:05:35 AM By: Worthy Keeler PA-C Entered By: Worthy Keeler on 01/24/2019 00:03:48 [name] ([id number]) -------------------------------------------------------------------------------- Physical Exam Details Patient Name: [name] Date of Service: 01/22/2019 9:00 AM Medical Record Number: [id number] Patient Account Number: [id number] Date of Birth/Sex: Jun 14, 1950 (68 y.o. M) Treating RN: Montey Hora Primary Care Provider: SYSTEM, PCP Other Clinician: Referring Provider: Referral, Self Treating Provider/Extender: STONE III, [name] Weeks in Treatment: 10 Constitutional Obese and well-hydrated in no acute distress. Respiratory normal breathing without difficulty. clear to auscultation bilaterally. Cardiovascular regular rate and rhythm with normal S1, S2. Psychiatric this patient is able to make decisions and demonstrates good insight into disease process. Alert and Oriented x 3. pleasant and cooperative. Notes Patient's wound bed showed signs of good granulation at this time to does not appear to be evidence of active infection which is good news. Overall very pleased with how he seems to be progressing no sharp debridement was necessary today. Electronic Signature(s) Signed: 01/24/2019 2:05:35 AM By: Worthy Keeler PA-C Entered By: Worthy Keeler on 01/24/2019 01:43:16 [name] ([id number]) -------------------------------------------------------------------------------- Physician Orders Details Patient Name: [name] Date of Service: 01/22/2019 9:00 AM Medical Record Number:  [id number] Patient Account Number: [id number] Date of Birth/Sex: 1950-11-20 (68 y.o. M) Treating RN: Montey Hora Primary Care Provider: SYSTEM, PCP Other Clinician: Referring Provider: Referral, Self Treating Provider/Extender: STONE III, [name] Weeks in Treatment: 10 Verbal / Phone Orders: No Diagnosis Coding ICD-10 Coding Code Description E11.622 Type 2 diabetes mellitus with other skin ulcer I89.0 Lymphedema, not elsewhere classified I87.2 Venous insufficiency (chronic) (peripheral) L97.822 Non-pressure chronic ulcer of other part of left lower leg with fat layer exposed I10 Essential (primary) hypertension Wound Cleansing Wound #8 Left,Posterior Lower Leg o May shower with protection. - Please do not get your wraps wet Primary Wound Dressing Wound #8 Left,Posterior Lower Leg o Hydrafera Blue Ready Transfer Secondary Dressing Wound #8 Left,Posterior Lower Leg o ABD pad Dressing Change Frequency Wound #8 Left,Posterior Lower Leg o Change dressing every week Follow-up Appointments Wound #8 Left,Posterior Lower Leg o Return Appointment in 1 week. Edema Control Wound #8 Left,Posterior Lower Leg o 3 Layer Compression System - Left Lower Extremity Electronic Signature(s) Signed: 01/22/2019 3:57:57 PM By: Montey Hora Signed: 01/24/2019 2:05:35 AM By: Worthy Keeler PA-C Entered By: Montey Hora on 01/22/2019  09:34:47 [name], [name] ([id number]) -------------------------------------------------------------------------------- Problem List Details Patient Name: [name], [name] Date of Service: 01/22/2019 9:00 AM Medical Record Number: [id number] Patient Account Number: [id number] Date of Birth/Sex: Dec 30, 1950 (68 y.o. M) Treating RN: Montey Hora Primary Care Provider: SYSTEM, PCP Other Clinician: Referring Provider: Referral, Self Treating Provider/Extender: Melburn Hake, [name] Weeks in Treatment: 10 Active Problems ICD-10 Evaluated Encounter Code Description  Active Date Today Diagnosis E11.622 Type 2 diabetes mellitus with other skin ulcer 11/12/2018 No Yes I89.0 Lymphedema, not elsewhere classified 11/12/2018 No Yes I87.2 Venous insufficiency (chronic) (peripheral) 11/12/2018 No Yes L97.822 Non-pressure chronic ulcer of other part of left lower leg with 11/12/2018 No Yes fat layer exposed I10 Essential (primary) hypertension 11/12/2018 No Yes Inactive Problems Resolved Problems Electronic Signature(s) Signed: 01/22/2019 9:08:33 AM By: Worthy Keeler PA-C Entered By: Worthy Keeler on 01/22/2019 09:08:32 [name] ([id number]) -------------------------------------------------------------------------------- Progress Note Details Patient Name: [name] Date of Service: 01/22/2019 9:00 AM Medical Record Number: [id number] Patient Account Number: [id number] Date of Birth/Sex: 1951-01-25 (68 y.o. M) Treating RN: Montey Hora Primary Care Provider: SYSTEM, PCP Other Clinician: Referring Provider: Referral, Self Treating Provider/Extender: STONE III, [name] Weeks in Treatment: 10 Subjective Chief Complaint Information obtained from Patient Left LE ulcers History of Present Illness (HPI) 68 year old male who presented to the ER with bilateral lower extremity blisters which had started last week. he has a past medical history of leukemia, diabetes mellitus, hypertension, edema of both lower extremities, his recurrent skin infections, peripheral vascular disease, coronary artery disease, congestive heart failure and peripheral neuropathy. in the ER he was given Rocephin and put on Silvadene cream. he was put on oral doxycycline and was asked to follow-up with the [facility]. His last hemoglobin A1c was 6.6 in December and he checks his blood sugar once a week. He does not have any physicians outside the [place] system. He does not recall any vascular duplex studies done either for arterial or venous disease but was told to wear compression stockings  which he does not use 05/30/2016 -- we have not yet received any of his notes from the [facility] hospital system and his arterial and venous duplex studies are scheduled here in [place] around mid February. We are unable to have his insurance accepted by home health agencies and hence he is getting dressings only once a week. 06/06/16 -- -- I received a call from the patient's PCP at the [facility] and spoke to Dr. Garvin Fila, phone number [phone] and fax number [phone]. She confirmed that no vascular testing was done over the last 5 years and she would be happy to do them if the patient did want them to be done at the [place] and we could fax him a request. Readmission: 68 year old male seen by as in February of this year and was referred to vein and vascular for studies and opinion from the vascular surgeons. The patient returns today with a fresh problem having had blisters on his left lower extremity which have been there for about 5 days and he clearly states that he has been wearing his compression stockings as advised though he could not read the moderate compression and has been wearing light compression. Review of his electronic medical records note that he had lower extremity arterial duplex examination done on 06/23/2016 which showed no hemodynamically significant stenosis in the bilateral lower extremity arterial system. He also had a lower extremity venous reflux examination done on 07/07/2016 and it was noted that he had venous incompetence in  the right great saphenous vein and bilateral common femoral veins. Patient was seen by Dr. Tamala Julian on the same day and for some reason his notes do not reflect the venous studies or the arterial studies and he recommended patient do a venous duplex ultrasound to look for reflux and return to see him.he would also consider a lymph pump if required. The patient was told that his workup was normal and hence the patient canceled his  follow-up appointment. 02/03/17 on evaluation today patient left medial lower extremity blister appears to be doing about the same. It is still continuing to drain and there's still the blistered skin covering the wound bed which is making it difficult for the alternate to do its job. Fortunately there is no evidence of cellulitis. No fevers chills noted. Patient states in general he is not having any significant discomfort. Patient's lower extremity arterial duplex exam revealed that patient was hemodynamically stable with no evidence of stenosis in regard to the bilateral lower extremities. The lower extremity venous reflux exam revealed the patient had venous incontinence noted in the right greater saphenous [place], [name] ([id number]) and bilateral common femoral vein. There is no evidence of deep or superficial vein thrombosis in the bilateral lower extremities. Readmission: 11/12/18 Patient presents for evaluation our clinic today concerning issues that he is having with his left lower extremity. He tells me that a couple weeks ago he began developing blisters on the left lower extremity along with increased swelling. He typically wears his compression stockings on a regular basis is previously been evaluated both here as well is with vascular surgery they would recommend lymphedema pumps but unfortunately that somehow fell through and he never heard anything back from that. Nonetheless I think lymphedema pumps would be beneficial for this patient. He does have a history of hypertension and diabetes. Obviously the chronic venous stasis and lymphedema as well. At this point the blisters have been given in more trouble he states sometimes when the blisters openings able to clean it down with alcohol and it will dry out and do well. Unfortunately that has not been the case this time. He is having some discomfort although this mean these with cleaning the areas he doesn't have discomfort just on a  regular basis. He has not been able to wear his compression stockings since the blisters arose due to the fact that of course it will drain into the socks causing additional issues and he didn't have any way to wrap this otherwise. He has increased to taking his Lasix every day instead of every other day. He sees his primary care provider later this month as well. No fevers, chills, nausea, or vomiting noted at this time. 11/19/18-Patient returns at 1 week, per intake RN the amount of seepage into the compression wraps was definitely improved, overall all the wounds are measuring smaller but continuing silver alginate to the wounds as primary dressing 11/26/18 on evaluation today patient appears to be doing quite well in regard to his left lower Trinity ulcers. In fact of the areas that were noted initially he only has two regions still open. There is no evidence of active infection at this time. He still is not heard anything from the company regarding lymphedema pumps as of yet. Again as previously seen vascular they have not recommended any surgical intervention. 12/03/2018 on evaluation today patient actually appears to be doing quite well with regard to his lower extremity ulcers. In fact most of the areas appear to be healed  the one spot which does not seem to be completely healed I am unsure of whether or not this is really draining that much but nonetheless there does not appear to be any signs of infection or significant drainage at this point. There is no sign of fever, chills, nausea, vomiting, or diarrhea. Overall I am pleased with how things have progressed I think is very close to being able to transition to his home compression stockings. 12/10/2018 upon evaluation today patient appears to be doing quite well with regard to his left lower extremity. He has been tolerating the dressing changes without complication. Fortunately there is no signs of active infection at this time. He  appears after thorough evaluation of his leg to only have 1 small area that remains open at this point everything else appears to be almost completely closed. He still have significant swelling of the left lower extremity. We had discussed discussing this with his primary care provider he is not able to see her in person they were at the [facility] and right now the [place] is not seeing patients on site. According to the patient anyway. Subsequently he did speak with her apparently and his primary care provider feels that he may likely have a DVT. With that being said she has not seen his leg she is just going off of his history. Nonetheless that is a concern that the patient now has as well and while I do not feel the DVT is likely we can definitely ensure that that is not the case I will go ahead and see about putting that order in today. Nonetheless otherwise I am in a recommend that we continue with the current wound care measures including the compression therapy most likely. We just need to ensure that his leg is indeed free of any DVTs. 12/17/2018 on evaluation today patient actually appears to be completely healed today. He does have 2 very small areas of blistering although this is not anything too significant at this point which is good news. With that being said I am in agreement with the fact that I think he is completely healed at this point. He does want to get back into his compression stocking. The good news is we have gotten approval from insurance for his lymphedema pumps we received a letter since last saw him last week. The other good news is his study did come back and showed no evidence of a DVT. 12/20/2018 on evaluation today patient presents for follow-up concerning his ongoing issues with his left lower extremity. He was actually discharged last Friday and did fairly well until he states blisters opened this morning. He tells me he has been wearing his compression stocking  although he has a hard time getting this on. There does not appear to be any signs of active infection at this time. No fevers, chills, nausea, vomiting, or diarrhea. 12/27/2018 on evaluation today patient appears to be doing very well with regard to his swelling of the left lower extremity the 4 layer compression wrap seems to have been beneficial for him. Fortunately there is no signs of active infection at this time. Patient has been tolerating the compression wrap without complication and his foot swelling in particular appears to be greatly improved. He does still have a wound on the lateral portion of his left leg I believe this is more of a blister that has now reopened. [name], [name] ([id number]) 01/03/2019 on evaluation today patient actually appears to be doing excellent in  regard to his left lower extremity. He did receive his compression pumps and is actually use this 7 times since he was last here in the office. On top of the compression wrap he is now roughly 3 cm better at the calf and 2 cm better at the ankle he also states that his foot seem to go an issue better without even having to use a shoe horn. Obviously I think this is all evidence that he is doing excellent in this regard. The other good news is he does not appear to have anything open today as far as wounds are concerned. 01/15/2019 on evaluation today patient appears to be doing more poorly yet again with regard to his left lower extremity. He has developed new wounds again after being discharged just recently. Unfortunately this continues to be the case that he will heal and then have subsequent new wounds. The last time I was hopeful that he may not end up coming back too quickly especially since he states he has been using his lymphedema pumps along with wearing his compression. Nonetheless he had a blister on the back of his leg that popped up on the left and this has opened up into an ulceration it is quite  painful. 01/22/19 on evaluation today patient actually appears to be doing well with regard to his wound on the left lower extremity. He's been tolerating the dressing changes without complication including the compression wrap in the wound appears to be significantly smaller today which is great news. Overall very pleased in this regard. Patient History Information obtained from Patient. Family History Cancer - Mother, Stroke - Father, No family history of Diabetes, Heart Disease, Hereditary Spherocytosis, Hypertension, Kidney Disease, Lung Disease, Seizures, Thyroid Problems, Tuberculosis. Social History Former smoker - quit 5 years ago, Marital Status - Divorced, Alcohol Use - Never, Drug Use - No History, Caffeine Use - Daily. Medical History Cardiovascular Patient has history of Hypertension, Peripheral Venous Disease Denies history of Angina, Arrhythmia, Congestive Heart Failure, Coronary Artery Disease, Deep Vein Thrombosis, Hypotension, Myocardial Infarction, Peripheral Arterial Disease, Phlebitis, Vasculitis Endocrine Patient has history of Type II Diabetes Integumentary (Skin) Denies history of History of Burn, History of pressure wounds Neurologic Patient has history of Neuropathy Denies history of Dementia, Quadriplegia, Paraplegia, Seizure Disorder Oncologic Patient has history of Received Chemotherapy Denies history of Received Radiation Medical And Surgical History Notes 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 Respiratory Denies complaints or symptoms of Chronic or frequent coughs, Shortness of Breath. Cardiovascular Denies complaints or symptoms of Chest pain, LE edema. Psychiatric Denies complaints or symptoms of Anxiety, Claustrophobia. [place], [name] ([id number]) Objective Constitutional Obese and well-hydrated in  no acute distress. Vitals Time Taken: 9:02 AM, Height: 73 in, Weight: 300 lbs, BMI: 39.6, Temperature: 98.0 F, Pulse: 57 bpm, Respiratory Rate: 16 breaths/min, Blood Pressure: 137/68 mmHg. Respiratory normal breathing without difficulty. clear to auscultation bilaterally. Cardiovascular regular rate and rhythm with normal S1, S2. Psychiatric this patient is able to make decisions and demonstrates good insight into disease process. Alert and Oriented x 3. pleasant and cooperative. General Notes: Patient's wound bed showed signs of good granulation at this time to does not appear to be evidence of active infection which is good news. Overall very pleased with how he seems to be progressing no sharp debridement was necessary today. Integumentary (Hair, Skin) Wound #8 status is Open. Original cause  of wound was Gradually Appeared. The wound is located on the Left,Posterior Lower Leg. The wound measures 1.5cm length x 1.7cm width x 0.1cm depth; 2.003cm^2 area and 0.2cm^3 volume. There is Fat Layer (Subcutaneous Tissue) Exposed exposed. There is no tunneling or undermining noted. There is a medium amount of serosanguineous drainage noted. The wound margin is flat and intact. There is large (67-100%) red granulation within the wound bed. There is a small (1-33%) amount of necrotic tissue within the wound bed including Adherent Slough. Assessment Active Problems ICD-10 Type 2 diabetes mellitus with other skin ulcer Lymphedema, not elsewhere classified Venous insufficiency (chronic) (peripheral) Non-pressure chronic ulcer of other part of left lower leg with fat layer exposed Essential (primary) hypertension Procedures [name], [name] ([id number]) Wound #8 Pre-procedure diagnosis of Wound #8 is a Diabetic Wound/Ulcer of the Lower Extremity located on the Left,Posterior Lower Leg . There was a Three Layer Compression Therapy Procedure with a pre-treatment ABI of 1.5 by Montey Hora, RN. Post  procedure Diagnosis Wound #8: Same as Pre-Procedure Plan Wound Cleansing: Wound #8 Left,Posterior Lower Leg: May shower with protection. - Please do not get your wraps wet Primary Wound Dressing: Wound #8 Left,Posterior Lower Leg: Hydrafera Blue Ready Transfer Secondary Dressing: Wound #8 Left,Posterior Lower Leg: ABD pad Dressing Change Frequency: Wound #8 Left,Posterior Lower Leg: Change dressing every week Follow-up Appointments: Wound #8 Left,Posterior Lower Leg: Return Appointment in 1 week. Edema Control: Wound #8 Left,Posterior Lower Leg: 3 Layer Compression System - Left Lower Extremity 1. I am gonna recommend currently that we continue with the [facility] Dressing as this seems to be doing very well for the patient. 2. I'm also gonna suggest we go ahead and continue with the three layer compression wrap is a seems to be very beneficial for the patient. He is tolerating this quite well. 3. The good news is he does have the Velcro compression wrap as well at this time and I'm hopeful that once we get the wound under control at this point he will be able to transition into that without any complication. Please see above for specific wound care orders. We will see patient for re-evaluation in 1 week(s) here in the clinic. If anything worsens or changes patient will contact our office for additional recommendations. Electronic Signature(s) Signed: 01/24/2019 2:05:35 AM By: Worthy Keeler PA-C Entered By: Worthy Keeler on 01/24/2019 01:45:02 [name] ([id number]) -------------------------------------------------------------------------------- ROS/PFSH Details Patient Name: [name] Date of Service: 01/22/2019 9:00 AM Medical Record Number: [id number] Patient Account Number: [id number] Date of Birth/Sex: 1950-05-21 (68 y.o. M) Treating RN: Montey Hora Primary Care Provider: SYSTEM, PCP Other Clinician: Referring Provider: Referral, Self Treating  Provider/Extender: STONE III, [name] Weeks in Treatment: 10 Information Obtained From Patient Constitutional Symptoms (General Health) Complaints and Symptoms: Negative for: Fatigue; Fever; Chills; Marked Weight Change Respiratory Complaints and Symptoms: Negative for: Chronic or frequent coughs; Shortness of Breath Cardiovascular Complaints and Symptoms: Negative for: Chest pain; LE edema Medical History: Positive for: Hypertension; Peripheral Venous Disease Negative for: Angina; Arrhythmia; Congestive Heart Failure; Coronary Artery Disease; Deep Vein Thrombosis; Hypotension; Myocardial Infarction; Peripheral Arterial Disease; Phlebitis; Vasculitis Psychiatric Complaints and Symptoms: Negative for: Anxiety; Claustrophobia Endocrine Medical History: Positive for: Type II Diabetes Treated with: Oral agents Integumentary (Skin) Medical History: Negative for: History of Burn; History of pressure wounds Neurologic Medical History: Positive for: Neuropathy Negative for: Dementia; Quadriplegia; Paraplegia; Seizure Disorder Oncologic Medical History: Positive for: Received Chemotherapy Negative for: Received Radiation [name], [name] ([id number]) Past Medical History Notes:  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 Immunizations Pneumococcal Vaccine: Received Pneumococcal Vaccination: Yes Immunization Notes: up to date Implantable Devices None Family and Social History Cancer: Yes - Mother; Diabetes: No; Heart Disease: No; Hereditary Spherocytosis: No; Hypertension: No; Kidney Disease: No; Lung Disease: No; Seizures: No; Stroke: Yes - Father; Thyroid Problems: No; Tuberculosis: No; Former smoker - quit 5 years ago; Marital Status - Divorced; Alcohol Use: Never; Drug Use: No History; Caffeine Use: Daily; Financial Concerns: No; Food, Clothing or Shelter Needs: No; Support System Lacking: No; Transportation Concerns: No Physician Affirmation I have reviewed and agree with the above  information. Electronic Signature(s) Signed: 01/24/2019 2:05:35 AM By: Worthy Keeler PA-C Signed: 01/25/2019 4:27:31 PM By: Montey Hora Entered By: Worthy Keeler on 01/24/2019 01:03:07 [name] ([id number]) -------------------------------------------------------------------------------- SuperBill Details Patient Name: [name] Date of Service: 01/22/2019 Medical Record Number: [id number] Patient Account Number: [id number] Date of Birth/Sex: [birth year] (68 y.o. M) Treating RN: Montey Hora Primary Care Provider: SYSTEM, PCP Other Clinician: Referring Provider: Referral, Self Treating Provider/Extender: STONE III, [name] Weeks in Treatment: 10 Diagnosis Coding ICD-10 Codes Code Description E11.622 Type 2 diabetes mellitus with other skin ulcer I89.0 Lymphedema, not elsewhere classified I87.2 Venous insufficiency (chronic) (peripheral) L97.822 Non-pressure chronic ulcer of other part of left lower leg with fat layer exposed I10 Essential (primary) hypertension Facility Procedures CPT4 Code: [id number] Description: (Facility Use Only) [id number] - APPLY [facility] L[facility] LT LEG Modifier: Quantity: 1 Physician Procedures CPT4 Code Description: [id number] - WC PHYS LEVEL 4 - EST PT ICD-10 Diagnosis Description I89.0 Lymphedema, not elsewhere classified E11.622 Type 2 diabetes mellitus with other skin ulcer I87.2 Venous insufficiency (chronic) (peripheral) L97.822  Non-pressure chronic ulcer of other part of left lower leg wit Modifier: h fat layer expos Quantity: 1 ed Electronic Signature(s) Signed: 01/24/2019 2:05:35 AM By: Worthy Keeler PA-C Previous Signature: 01/22/2019 3:57:57 PM Version By: Montey Hora Entered By: Worthy Keeler on 01/22/2019 23:23:36

## 2019-01-22 NOTE — Progress Notes (Signed)
SOTERO, TORCIVIA ([id number]) Visit Report for 01/22/2019 Arrival Information Details Patient Name: [name], [name] Date of Service: 01/22/2019 9:00 AM Medical Record Number: [id number] Patient Account Number: [id number] Date of Birth/Sex: 1950/07/08 (68 y.o. M) Treating RN: Army Melia Primary Care Rocio Roam: SYSTEM, PCP Other Clinician: Referring Dallis Czaja: Referral, Self Treating Yeimi Debnam/Extender: STONE III, [name] Weeks in Treatment: 10 Visit Information History Since Last Visit Added or deleted any medications: No Patient Arrived: Ambulatory Any new allergies or adverse reactions: No Arrival Time: 09:01 Had a fall or experienced change in No Accompanied By: self activities of daily living that may affect Transfer Assistance: None risk of falls: Patient Has Alerts: Yes Signs or symptoms of abuse/neglect since last visito No Patient Alerts: DMII Hospitalized since last visit: No Has Dressing in Place as Prescribed: Yes Pain Present Now: No Electronic Signature(s) Signed: 01/22/2019 11:24:57 AM By: Army Melia Entered By: Army Melia on 01/22/2019 09:02:08 [name] ([id number]) -------------------------------------------------------------------------------- Compression Therapy Details Patient Name: [name] Date of Service: 01/22/2019 9:00 AM Medical Record Number: [id number] Patient Account Number: [id number] Date of Birth/Sex: 07-May-1951 (68 y.o. M) Treating RN: Montey Hora Primary Care Raekwan Spelman: SYSTEM, PCP Other Clinician: Referring Boyd Litaker: Referral, Self Treating Ileane Sando/Extender: STONE III, [name] Weeks in Treatment: 10 Compression Therapy Performed for Wound Assessment: Wound #8 Left,Posterior Lower Leg Performed By: Clinician Montey Hora, RN Compression Type: Three Layer Pre Treatment ABI: 1.5 Post Procedure Diagnosis Same as Pre-procedure Electronic Signature(s) Signed: 01/22/2019 3:57:57 PM By: Montey Hora Entered By: Montey Hora on 01/22/2019  09:34:24 [name] ([id number]) -------------------------------------------------------------------------------- Encounter Discharge Information Details Patient Name: [name] Date of Service: 01/22/2019 9:00 AM Medical Record Number: [id number] Patient Account Number: [id number] Date of Birth/Sex: Apr 26, 1951 (68 y.o. M) Treating RN: Montey Hora Primary Care Skyelyn Scruggs: SYSTEM, PCP Other Clinician: Referring Allean Montfort: Referral, Self Treating Lexia Vandevender/Extender: STONE III, [name] Weeks in Treatment: 10 Encounter Discharge Information Items Discharge Condition: Stable Ambulatory Status: Ambulatory Discharge Destination: Home Transportation: Private Auto Accompanied By: self Schedule Follow-up Appointment: Yes Clinical Summary of Care: Electronic Signature(s) Signed: 01/22/2019 3:57:57 PM By: Montey Hora Entered By: Montey Hora on 01/22/2019 09:36:01 [name] ([id number]) -------------------------------------------------------------------------------- Lower Extremity Assessment Details Patient Name: [name] Date of Service: 01/22/2019 9:00 AM Medical Record Number: [id number] Patient Account Number: [id number] Date of Birth/Sex: [birth year] (68 y.o. M) Treating RN: Army Melia Primary Care Kol Consuegra: SYSTEM, PCP Other Clinician: Referring Dushawn Pusey: Referral, Self Treating Evangelia Whitaker/Extender: STONE III, [name] Weeks in Treatment: 10 Edema Assessment Assessed: [Left: No] [Right: No] Edema: [Left: N] [Right: o] Calf Left: Right: Point of Measurement: 35 cm From Medial Instep 40 cm cm Ankle Left: Right: Point of Measurement: 11 cm From Medial Instep 29 cm cm Vascular Assessment Pulses: Dorsalis Pedis Palpable: [Left:Yes] Electronic Signature(s) Signed: 01/22/2019 11:24:57 AM By: Army Melia Entered By: Army Melia on 01/22/2019 09:10:01 [name] ([id number]) -------------------------------------------------------------------------------- Multi Wound  Chart Details Patient Name: [name] Date of Service: 01/22/2019 9:00 AM Medical Record Number: [id number] Patient Account Number: [id number] Date of Birth/Sex: [birth year] (68 y.o. M) Treating RN: Montey Hora Primary Care Wylma Tatem: SYSTEM, PCP Other Clinician: Referring Ladashia Demarinis: Referral, Self Treating Kinser Fellman/Extender: STONE III, [name] Weeks in Treatment: 10 Vital Signs Height(in): 73 Pulse(bpm): 38 Weight(lbs): 300 Blood Pressure(mmHg): 137/68 Body Mass Index(BMI): 40 Temperature(F): 98.0 Respiratory Rate 16 (breaths/min): Photos: [N/A:N/A] Wound Location: Left Lower Leg - Posterior N/A N/A Wounding Event: Gradually Appeared N/A N/A Primary Etiology: Diabetic Wound/Ulcer of the N/A N/A Lower Extremity Comorbid History: Hypertension, Peripheral N/A N/A Venous Disease, Type II Diabetes, Neuropathy, Received Chemotherapy  Date Acquired: 01/01/2019 N/A N/A Weeks of Treatment: 1 N/A N/A Wound Status: Open N/A N/A Measurements L x W x D 1.5x1.7x0.1 N/A N/A (cm) Area (cm) : 2.003 N/A N/A Volume (cm) : 0.2 N/A N/A % Reduction in Area: 25.00% N/A N/A % Reduction in Volume: 25.10% N/A N/A Classification: Grade 2 N/A N/A Exudate Amount: Medium N/A N/A Exudate Type: Serosanguineous N/A N/A Exudate Color: red, brown N/A N/A Wound Margin: Flat and Intact N/A N/A Granulation Amount: Large (67-100%) N/A N/A Granulation Quality: Red N/A N/A Necrotic Amount: Small (1-33%) N/A N/A Exposed Structures: Fat Layer (Subcutaneous N/A N/A Tissue) Exposed: Yes Fascia: No Tendon: No [name], [name] ([id number]) Muscle: No Joint: No Bone: No Epithelialization: None N/A N/A Treatment Notes Electronic Signature(s) Signed: 01/22/2019 3:57:57 PM By: Montey Hora Entered By: Montey Hora on 01/22/2019 09:34:07 [name] ([id number]) -------------------------------------------------------------------------------- Multi-Disciplinary Care Plan Details Patient Name: [name] Date of Service: 01/22/2019 9:00 AM Medical Record Number: [id number] Patient Account Number: [id number] Date of Birth/Sex: [birth year] (68 y.o. M) Treating RN: Montey Hora Primary Care Brayan Votaw: SYSTEM, PCP Other Clinician: Referring Walda Hertzog: Referral, Self Treating Wesly Whisenant/Extender: STONE III, [name] Weeks in Treatment: 10 Active Inactive Venous Leg Ulcer Nursing Diagnoses: Actual venous Insuffiency (use after diagnosis is confirmed) Knowledge deficit related to disease process and management Goals: Patient will maintain optimal edema control Date Initiated: 11/12/2018 Target Resolution Date: 03/08/2019 Goal Status: Active Patient/caregiver will verbalize understanding of disease process and disease management Date Initiated: 11/12/2018 Target Resolution Date: 03/08/2019 Goal Status: Active Interventions: Assess peripheral edema status every visit. Compression as ordered Provide education on venous insufficiency Notes: Wound/Skin Impairment Nursing Diagnoses: Impaired tissue integrity Knowledge deficit related to ulceration/compromised skin integrity Goals: Ulcer/skin breakdown will have a volume reduction of 30% by week 4 Date Initiated: 11/12/2018 Target Resolution Date: 03/08/2019 Goal Status: Active Interventions: Assess patient/caregiver ability to obtain necessary supplies Assess patient/caregiver ability to perform ulcer/skin care regimen upon admission and as needed Assess ulceration(s) every visit Provide education on ulcer and skin care Notes: [name], [name] ([id number]) Electronic Signature(s) Signed: 01/22/2019 3:57:57 PM By: Montey Hora Entered By: Montey Hora on 01/22/2019 09:33:28 [name] ([id number]) -------------------------------------------------------------------------------- Pain Assessment Details Patient Name: [name] Date of Service: 01/22/2019 9:00 AM Medical Record Number: [id number] Patient Account Number: [id number] Date of  Birth/Sex: [birth year] (68 y.o. M) Treating RN: Army Melia Primary Care Amarria Andreasen: SYSTEM, PCP Other Clinician: Referring Katheline Brendlinger: Referral, Self Treating Geary Rufo/Extender: STONE III, [name] Weeks in Treatment: 10 Active Problems Location of Pain Severity and Description of Pain Patient Has Paino No Site Locations Pain Management and Medication Current Pain Management: Electronic Signature(s) Signed: 01/22/2019 11:24:57 AM By: Army Melia Entered By: Army Melia on 01/22/2019 09:02:14 [name] ([id number]) -------------------------------------------------------------------------------- Patient/Caregiver Education Details Patient Name: [name] Date of Service: 01/22/2019 9:00 AM Medical Record Number: [id number] Patient Account Number: [id number] Date of Birth/Gender: 06/07/1950 (68 y.o. M) Treating RN: Montey Hora Primary Care Physician: SYSTEM, PCP Other Clinician: Referring Physician: Referral, Self Treating Physician/Extender: Melburn Hake, [name] Weeks in Treatment: 10 Education Assessment Education Provided To: Patient Education Topics Provided Venous: Handouts: Other: need for ongoing compression Methods: Explain/Verbal Responses: State content correctly Electronic Signature(s) Signed: 01/22/2019 3:57:57 PM By: Montey Hora Entered By: Montey Hora on 01/22/2019 09:35:24 [name] ([id number]) -------------------------------------------------------------------------------- Wound Assessment Details Patient Name: [name] Date of Service: 01/22/2019 9:00 AM Medical Record Number: [id number] Patient Account Number: [id number] Date of Birth/Sex: 13-Mar-1951 (68 y.o. M) Treating RN: Army Melia Primary Care Gabryel Files: SYSTEM, PCP Other Clinician: Referring Rodell Marrs:  Referral, Self Treating Josean Lycan/Extender: STONE III, [name] Weeks in Treatment: 10 Wound Status Wound Number: 8 Primary Diabetic Wound/Ulcer of the Lower Extremity Etiology: Wound  Location: Left Lower Leg - Posterior Wound Open Wounding Event: Gradually Appeared Status: Date Acquired: 01/01/2019 Comorbid Hypertension, Peripheral Venous Disease, Type Weeks Of Treatment: 1 History: II Diabetes, Neuropathy, Received Clustered Wound: No Chemotherapy Photos Wound Measurements Length: (cm) 1.5 % Reduction in Width: (cm) 1.7 % Reduction in Depth: (cm) 0.1 Epithelializat Area: (cm) 2.003 Tunneling: Volume: (cm) 0.2 Undermining: Area: 25% Volume: 25.1% ion: None No No Wound Description Classification: Grade 2 Foul Odor Aft Wound Margin: Flat and Intact Slough/Fibrin Exudate Amount: Medium Exudate Type: Serosanguineous Exudate Color: red, brown er Cleansing: No o Yes Wound Bed Granulation Amount: Large (67-100%) Exposed Structure Granulation Quality: Red Fascia Exposed: No Necrotic Amount: Small (1-33%) Fat Layer (Subcutaneous Tissue) Exposed: Yes Necrotic Quality: Adherent Slough Tendon Exposed: No Muscle Exposed: No Joint Exposed: No Bone Exposed: No Treatment Notes [name], [name] ([id number]) Wound #8 (Left, Posterior Lower Leg) Notes hydrofera blue, ABD, 3 layer Electronic Signature(s) Signed: 01/22/2019 11:24:57 AM By: Army Melia Entered By: Army Melia on 01/22/2019 09:07:59 [name] ([id number]) -------------------------------------------------------------------------------- Vitals Details Patient Name: [name] Date of Service: 01/22/2019 9:00 AM Medical Record Number: [id number] Patient Account Number: [id number] Date of Birth/Sex: Oct 21, 1950 (68 y.o. M) Treating RN: Army Melia Primary Care Malan Werk: SYSTEM, PCP Other Clinician: Referring Equilla Que: Referral, Self Treating Rhonda Linan/Extender: STONE III, [name] Weeks in Treatment: 10 Vital Signs Time Taken: 09:02 Temperature (F): 98.0 Height (in): 73 Pulse (bpm): 57 Weight (lbs): 300 Respiratory Rate (breaths/min): 16 Body Mass Index (BMI): 39.6 Blood Pressure (mmHg):  137/68 Reference Range: 80 - 120 mg / dl Electronic Signature(s) Signed: 01/22/2019 11:24:57 AM By: Army Melia Entered By: Army Melia on 01/22/2019 09:02:39

## 2019-01-29 ENCOUNTER — Encounter: Payer: PPO | Admitting: Physician Assistant

## 2019-01-29 ENCOUNTER — Other Ambulatory Visit: Payer: Self-pay

## 2019-01-29 DIAGNOSIS — L97222 Non-pressure chronic ulcer of left calf with fat layer exposed: Secondary | ICD-10-CM | POA: Diagnosis not present

## 2019-01-29 DIAGNOSIS — E11622 Type 2 diabetes mellitus with other skin ulcer: Secondary | ICD-10-CM | POA: Diagnosis not present

## 2019-01-29 NOTE — Progress Notes (Addendum)
[name], Mooney ([id number]) Visit Report for 01/29/2019 Arrival Information Details Patient Name: [name], [name] Date of Service: 01/29/2019 10:30 AM Medical Record Number: [id number] Patient Account Number: [id number] Date of Birth/Sex: 1951/03/27 (68 y.o. M) Treating RN: Montey Hora Primary Care Jastin Fore: SYSTEM, PCP Other Clinician: Referring Keishla Oyer: Referral, Self Treating Bannon Giammarco/Extender: STONE III, [name] Weeks in Treatment: 11 Visit Information History Since Last Visit Added or deleted any medications: No Patient Arrived: Ambulatory Any new allergies or adverse reactions: No Arrival Time: 10:36 Had a fall or experienced change in No Accompanied By: self activities of daily living that may affect Transfer Assistance: None risk of falls: Patient Identification Verified: Yes Signs or symptoms of abuse/neglect since last visito No Secondary Verification Process Completed: Yes Hospitalized since last visit: No Patient Has Alerts: Yes Implantable device outside of the clinic excluding No Patient Alerts: DMII cellular tissue based products placed in the center since last visit: Has Dressing in Place as Prescribed: Yes Pain Present Now: No Electronic Signature(s) Signed: 01/29/2019 1:07:49 PM By: Lorine Bears RCP, RRT, CHT Entered By: Lorine Bears on 01/29/2019 10:37:23 [name] ([id number]) -------------------------------------------------------------------------------- Encounter Discharge Information Details Patient Name: [name] Date of Service: 01/29/2019 10:30 AM Medical Record Number: [id number] Patient Account Number: [id number] Date of Birth/Sex: [birth year] (68 y.o. M) Treating RN: Montey Hora Primary Care Cheila Wickstrom: SYSTEM, PCP Other Clinician: Referring Jaques Mineer: Referral, Self Treating Kyannah Climer/Extender: STONE III, [name] Weeks in Treatment: 11 Encounter Discharge Information Items Post Procedure Vitals Discharge  Condition: Stable Temperature (F): 98.4 Ambulatory Status: Ambulatory Pulse (bpm): 60 Discharge Destination: Home Respiratory Rate (breaths/min): 16 Transportation: Private Auto Blood Pressure (mmHg): 128/60 Accompanied By: self Schedule Follow-up Appointment: Yes Clinical Summary of Care: Electronic Signature(s) Signed: 01/29/2019 4:20:48 PM By: Montey Hora Entered By: Montey Hora on 01/29/2019 11:10:17 [name] ([id number]) -------------------------------------------------------------------------------- Lower Extremity Assessment Details Patient Name: [name] Date of Service: 01/29/2019 10:30 AM Medical Record Number: [id number] Patient Account Number: [id number] Date of Birth/Sex: 1951-02-21 (68 y.o. M) Treating RN: Army Melia Primary Care Henrine Hayter: SYSTEM, PCP Other Clinician: Referring Sulema Braid: Referral, Self Treating Versie Fleener/Extender: STONE III, [name] Weeks in Treatment: 11 Edema Assessment Assessed: [Left: No] [Right: No] Edema: [Left: N] [Right: o] Calf Left: Right: Point of Measurement: 35 cm From Medial Instep 40 cm cm Ankle Left: Right: Point of Measurement: 11 cm From Medial Instep 29 cm cm Vascular Assessment Pulses: Dorsalis Pedis Palpable: [Left:Yes] Electronic Signature(s) Signed: 01/29/2019 3:53:22 PM By: Army Melia Entered By: Army Melia on 01/29/2019 10:43:37 [name] ([id number]) -------------------------------------------------------------------------------- Multi Wound Chart Details Patient Name: [name] Date of Service: 01/29/2019 10:30 AM Medical Record Number: [id number] Patient Account Number: [id number] Date of Birth/Sex: 1951-04-21 (68 y.o. M) Treating RN: Montey Hora Primary Care Adalyn Pennock: SYSTEM, PCP Other Clinician: Referring Elfreda Blanchet: Referral, Self Treating Clary Meeker/Extender: STONE III, [name] Weeks in Treatment: 11 Vital Signs Height(in): 73 Pulse(bpm): 60 Weight(lbs): 300 Blood Pressure(mmHg):  128/60 Body Mass Index(BMI): 40 Temperature(F): 98.4 Respiratory Rate 16 (breaths/min): Photos: [N/A:N/A] Wound Location: Left Lower Leg - Posterior N/A N/A Wounding Event: Gradually Appeared N/A N/A Primary Etiology: Diabetic Wound/Ulcer of the N/A N/A Lower Extremity Comorbid History: Hypertension, Peripheral N/A N/A Venous Disease, Type II Diabetes, Neuropathy, Received Chemotherapy Date Acquired: 01/01/2019 N/A N/A Weeks of Treatment: 2 N/A N/A Wound Status: Open N/A N/A Measurements L x W x D 1x0.9x0.1 N/A N/A (cm) Area (cm) : 0.707 N/A N/A Volume (cm) : 0.071 N/A N/A % Reduction in Area: 73.50% N/A N/A % Reduction in Volume: 73.40% N/A N/A Classification:  Grade 2 N/A N/A Exudate Amount: Medium N/A N/A Exudate Type: Serosanguineous N/A N/A Exudate Color: red, brown N/A N/A Wound Margin: Flat and Intact N/A N/A Granulation Amount: Large (67-100%) N/A N/A Granulation Quality: Red N/A N/A Necrotic Amount: Small (1-33%) N/A N/A Exposed Structures: Fat Layer (Subcutaneous N/A N/A Tissue) Exposed: Yes Fascia: No Tendon: No JERRIEL, MURAT ([id number]) Muscle: No Joint: No Bone: No Epithelialization: None N/A N/A Treatment Notes Electronic Signature(s) Signed: 01/29/2019 4:20:48 PM By: Montey Hora Entered By: Montey Hora on 01/29/2019 11:03:04 [name] ([id number]) -------------------------------------------------------------------------------- Multi-Disciplinary Care Plan Details Patient Name: [name] Date of Service: 01/29/2019 10:30 AM Medical Record Number: [id number] Patient Account Number: [id number] Date of Birth/Sex: 04-Oct-1950 (68 y.o. M) Treating RN: Montey Hora Primary Care Sydna Brodowski: SYSTEM, PCP Other Clinician: Referring Rodolphe Edmonston: Referral, Self Treating Hollister Wessler/Extender: STONE III, [name] Weeks in Treatment: 11 Active Inactive Venous Leg Ulcer Nursing Diagnoses: Actual venous Insuffiency (use after diagnosis is confirmed) Knowledge  deficit related to disease process and management Goals: Patient will maintain optimal edema control Date Initiated: 11/12/2018 Target Resolution Date: 03/08/2019 Goal Status: Active Patient/caregiver will verbalize understanding of disease process and disease management Date Initiated: 11/12/2018 Target Resolution Date: 03/08/2019 Goal Status: Active Interventions: Assess peripheral edema status every visit. Compression as ordered Provide education on venous insufficiency Notes: Wound/Skin Impairment Nursing Diagnoses: Impaired tissue integrity Knowledge deficit related to ulceration/compromised skin integrity Goals: Ulcer/skin breakdown will have a volume reduction of 30% by week 4 Date Initiated: 11/12/2018 Target Resolution Date: 03/08/2019 Goal Status: Active Interventions: Assess patient/caregiver ability to obtain necessary supplies Assess patient/caregiver ability to perform ulcer/skin care regimen upon admission and as needed Assess ulceration(s) every visit Provide education on ulcer and skin care Notes: JERMON, HUMPERT ([id number]) Electronic Signature(s) Signed: 01/29/2019 4:20:48 PM By: Montey Hora Entered By: Montey Hora on 01/29/2019 11:02:57 [name] ([id number]) -------------------------------------------------------------------------------- Pain Assessment Details Patient Name: [name] Date of Service: 01/29/2019 10:30 AM Medical Record Number: [id number] Patient Account Number: [id number] Date of Birth/Sex: [birth year] (68 y.o. M) Treating RN: Montey Hora Primary Care Diesel Lina: SYSTEM, PCP Other Clinician: Referring Carolynn Tuley: Referral, Self Treating Skylee Baird/Extender: STONE III, [name] Weeks in Treatment: 11 Active Problems Location of Pain Severity and Description of Pain Patient Has Paino No Site Locations Pain Management and Medication Current Pain Management: Electronic Signature(s) Signed: 01/29/2019 1:07:49 PM By: Paulla Fore, RRT, CHT Signed: 01/29/2019 4:20:48 PM By: Montey Hora Entered By: Lorine Bears on 01/29/2019 10:37:33 [name] ([id number]) -------------------------------------------------------------------------------- Patient/Caregiver Education Details Patient Name: [name] Date of Service: 01/29/2019 10:30 AM Medical Record Number: [id number] Patient Account Number: [id number] Date of Birth/Gender: December 28, 1950 (68 y.o. M) Treating RN: Montey Hora Primary Care Physician: SYSTEM, PCP Other Clinician: Referring Physician: Referral, Self Treating Physician/Extender: Melburn Hake, [name] Weeks in Treatment: 11 Education Assessment Education Provided To: Patient Education Topics Provided Venous: Handouts: Other: continue using pumps Methods: Explain/Verbal Responses: State content correctly Electronic Signature(s) Signed: 01/29/2019 4:20:48 PM By: Montey Hora Entered By: Montey Hora on 01/29/2019 11:07:42 [name] ([id number]) -------------------------------------------------------------------------------- Wound Assessment Details Patient Name: [name] Date of Service: 01/29/2019 10:30 AM Medical Record Number: [id number] Patient Account Number: [id number] Date of Birth/Sex: [birth year] (68 y.o. M) Treating RN: Army Melia Primary Care Lambert Jeanty: SYSTEM, PCP Other Clinician: Referring Maryela Tapper: Referral, Self Treating Dashanique Brownstein/Extender: STONE III, [name] Weeks in Treatment: 11 Wound Status Wound Number: 8 Primary Diabetic Wound/Ulcer of the Lower Extremity Etiology: Wound Location: Left Lower Leg - Posterior Wound Open Wounding Event: Gradually Appeared Status: Date Acquired: 01/01/2019 Comorbid  Hypertension, Peripheral Venous Disease, Type Weeks Of Treatment: 2 History: II Diabetes, Neuropathy, Received Clustered Wound: No Chemotherapy Photos Wound Measurements Length: (cm) 1 % Reduction i Width: (cm) 0.9 % Reduction i Depth: (cm) 0.1  Epithelializa Area: (cm) 0.707 Tunneling: Volume: (cm) 0.071 Undermining: n Area: 73.5% n Volume: 73.4% tion: None No No Wound Description Classification: Grade 2 Foul Odor Af Wound Margin: Flat and Intact Slough/Fibri Exudate Amount: Medium Exudate Type: Serosanguineous Exudate Color: red, brown ter Cleansing: No no Yes Wound Bed Granulation Amount: Large (67-100%) Exposed Structure Granulation Quality: Red Fascia Exposed: No Necrotic Amount: Small (1-33%) Fat Layer (Subcutaneous Tissue) Exposed: Yes Necrotic Quality: Adherent Slough Tendon Exposed: No Muscle Exposed: No Joint Exposed: No Bone Exposed: No Treatment Notes KALUP, KLIMAS ([id number]) Wound #8 (Left, Posterior Lower Leg) Notes hydrofera blue, ABD, 3 layer Electronic Signature(s) Signed: 01/29/2019 3:53:22 PM By: Army Melia Entered By: Army Melia on 01/29/2019 10:42:39 [name] ([id number]) -------------------------------------------------------------------------------- Vitals Details Patient Name: [name] Date of Service: 01/29/2019 10:30 AM Medical Record Number: [id number] Patient Account Number: [id number] Date of Birth/Sex: 10-Oct-1950 (68 y.o. M) Treating RN: Montey Hora Primary Care Jerricka Carvey: SYSTEM, PCP Other Clinician: Referring Reon Hunley: Referral, Self Treating Lizann Edelman/Extender: STONE III, [name] Weeks in Treatment: 11 Vital Signs Time Taken: 10:37 Temperature (F): 98.4 Height (in): 73 Pulse (bpm): 60 Weight (lbs): 300 Respiratory Rate (breaths/min): 16 Body Mass Index (BMI): 39.6 Blood Pressure (mmHg): 128/60 Reference Range: 80 - 120 mg / dl Electronic Signature(s) Signed: 01/29/2019 1:07:49 PM By: Lorine Bears RCP, RRT, CHT Entered By: Lorine Bears on 01/29/2019 10:39:37

## 2019-01-29 NOTE — Progress Notes (Addendum)
JEREMIH, GERLICH ([id number]) Visit Report for 01/29/2019 Chief Complaint Document Details Patient Name: [name], [name] Date of Service: 01/29/2019 10:30 AM Medical Record Number: [id number] Patient Account Number: [id number] Date of Birth/Sex: 17-Dec-1950 (68 y.o. M) Treating RN: Montey Hora Primary Care Provider: SYSTEM, PCP Other Clinician: Referring Provider: Referral, Self Treating Provider/Extender: Melburn Hake, [name] Weeks in Treatment: 11 Information Obtained from: Patient Chief Complaint Left LE ulcers Electronic Signature(s) Signed: 01/29/2019 10:30:00 AM By: Worthy Keeler PA-C Entered By: Worthy Keeler on 01/29/2019 10:29:59 [name] ([id number]) -------------------------------------------------------------------------------- Debridement Details Patient Name: [name] Date of Service: 01/29/2019 10:30 AM Medical Record Number: [id number] Patient Account Number: [id number] Date of Birth/Sex: 10/03/1950 (68 y.o. M) Treating RN: Montey Hora Primary Care Provider: SYSTEM, PCP Other Clinician: Referring Provider: Referral, Self Treating Provider/Extender: STONE III, [name] Weeks in Treatment: 11 Debridement Performed for Wound #8 Left,Posterior Lower Leg Assessment: Performed By: Physician STONE III, [name] E., PA-C Debridement Type: Debridement Severity of Tissue Pre Fat layer exposed Debridement: Level of Consciousness (Pre- Awake and Alert procedure): Pre-procedure Verification/Time Yes - 11:04 Out Taken: Start Time: 11:04 Pain Control: Lidocaine 4% Topical Solution Total Area Debrided (L x W): 1 (cm) x 0.9 (cm) = 0.9 (cm) Tissue and other material Viable, Non-Viable, Slough, Subcutaneous, Slough debrided: Level: Skin/Subcutaneous Tissue Debridement Description: Excisional Instrument: Curette Bleeding: Minimum Hemostasis Achieved: Pressure End Time: 11:06 Procedural Pain: 0 Post Procedural Pain: 0 Response to Treatment: Procedure was tolerated  well Level of Consciousness Awake and Alert (Post-procedure): Post Debridement Measurements of Total Wound Length: (cm) 1 Width: (cm) 0.9 Depth: (cm) 0.2 Volume: (cm) 0.141 Character of Wound/Ulcer Post Debridement: Improved Severity of Tissue Post Debridement: Fat layer exposed Post Procedure Diagnosis Same as Pre-procedure Electronic Signature(s) Signed: 01/29/2019 4:20:48 PM By: Montey Hora Signed: 01/29/2019 6:05:24 PM By: Worthy Keeler PA-C Entered By: Montey Hora on 01/29/2019 11:06:46 [name] ([id number]) -------------------------------------------------------------------------------- HPI Details Patient Name: [name] Date of Service: 01/29/2019 10:30 AM Medical Record Number: [id number] Patient Account Number: [id number] Date of Birth/Sex: 10/26/1950 (68 y.o. M) Treating RN: Montey Hora Primary Care Provider: SYSTEM, PCP Other Clinician: Referring Provider: Referral, Self Treating Provider/Extender: STONE III, [name] Weeks in Treatment: 11 History of Present Illness HPI Description: 68 year old male who presented to the ER with bilateral lower extremity blisters which had started last week. he has a past medical history of leukemia, diabetes mellitus, hypertension, edema of both lower extremities, his recurrent skin infections, peripheral vascular disease, coronary artery disease, congestive heart failure and peripheral neuropathy. in the ER he was given Rocephin and put on Silvadene cream. he was put on oral doxycycline and was asked to follow-up with the [facility]. His last hemoglobin A1c was 6.6 in December and he checks his blood sugar once a week. He does not have any physicians outside the [place] system. He does not recall any vascular duplex studies done either for arterial or venous disease but was told to wear compression stockings which he does not use 05/30/2016 -- we have not yet received any of his notes from the [facility] hospital system and his  arterial and venous duplex studies are scheduled here in [place] around mid February. We are unable to have his insurance accepted by home health agencies and hence he is getting dressings only once a week. 06/06/16 -- -- I received a call from the patient's PCP at the [facility] and spoke to Dr. Garvin Fila, phone number [phone] and fax number [phone]. She confirmed that no vascular testing  was done over the last 5 years and she would be happy to do them if the patient did want them to be done at the [place] and we could fax him a request. Readmission: 68 year old male seen by as in February of this year and was referred to vein and vascular for studies and opinion from the vascular surgeons. The patient returns today with a fresh problem having had blisters on his left lower extremity which have been there for about 5 days and he clearly states that he has been wearing his compression stockings as advised though he could not read the moderate compression and has been wearing light compression. Review of his electronic medical records note that he had lower extremity arterial duplex examination done on 06/23/2016 which showed no hemodynamically significant stenosis in the bilateral lower extremity arterial system. He also had a lower extremity venous reflux examination done on 07/07/2016 and it was noted that he had venous incompetence in the right great saphenous vein and bilateral common femoral veins. Patient was seen by Dr. Tamala Julian on the same day and for some reason his notes do not reflect the venous studies or the arterial studies and he recommended patient do a venous duplex ultrasound to look for reflux and return to see him.he would also consider a lymph pump if required. The patient was told that his workup was normal and hence the patient canceled his follow-up appointment. 02/03/17 on evaluation today patient left medial lower extremity blister appears to be doing about  the same. It is still continuing to drain and there's still the blistered skin covering the wound bed which is making it difficult for the alternate to do its job. Fortunately there is no evidence of cellulitis. No fevers chills noted. Patient states in general he is not having any significant discomfort. Patient's lower extremity arterial duplex exam revealed that patient was hemodynamically stable with no evidence of stenosis in regard to the bilateral lower extremities. The lower extremity venous reflux exam revealed the patient had venous incontinence noted in the right greater saphenous and bilateral common femoral vein. There is no evidence of deep or superficial vein thrombosis in the bilateral lower extremities. Readmission: [name], [name] ([id number]) 11/12/18 Patient presents for evaluation our clinic today concerning issues that he is having with his left lower extremity. He tells me that a couple weeks ago he began developing blisters on the left lower extremity along with increased swelling. He typically wears his compression stockings on a regular basis is previously been evaluated both here as well is with vascular surgery they would recommend lymphedema pumps but unfortunately that somehow fell through and he never heard anything back from that. Nonetheless I think lymphedema pumps would be beneficial for this patient. He does have a history of hypertension and diabetes. Obviously the chronic venous stasis and lymphedema as well. At this point the blisters have been given in more trouble he states sometimes when the blisters openings able to clean it down with alcohol and it will dry out and do well. Unfortunately that has not been the case this time. He is having some discomfort although this mean these with cleaning the areas he doesn't have discomfort just on a regular basis. He has not been able to wear his compression stockings since the blisters arose due to the fact that of  course it will drain into the socks causing additional issues and he didn't have any way to wrap this otherwise. He has increased to taking his Lasix  every day instead of every other day. He sees his primary care provider later this month as well. No fevers, chills, nausea, or vomiting noted at this time. 11/19/18-Patient returns at 1 week, per intake RN the amount of seepage into the compression wraps was definitely improved, overall all the wounds are measuring smaller but continuing silver alginate to the wounds as primary dressing 11/26/18 on evaluation today patient appears to be doing quite well in regard to his left lower [place] ulcers. In fact of the areas that were noted initially he only has two regions still open. There is no evidence of active infection at this time. He still is not heard anything from the company regarding lymphedema pumps as of yet. Again as previously seen vascular they have not recommended any surgical intervention. 12/03/2018 on evaluation today patient actually appears to be doing quite well with regard to his lower extremity ulcers. In fact most of the areas appear to be healed the one spot which does not seem to be completely healed I am unsure of whether or not this is really draining that much but nonetheless there does not appear to be any signs of infection or significant drainage at this point. There is no sign of fever, chills, nausea, vomiting, or diarrhea. Overall I am pleased with how things have progressed I think is very close to being able to transition to his home compression stockings. 12/10/2018 upon evaluation today patient appears to be doing quite well with regard to his left lower extremity. He has been tolerating the dressing changes without complication. Fortunately there is no signs of active infection at this time. He appears after thorough evaluation of his leg to only have 1 small area that remains open at this point everything else appears to  be almost completely closed. He still have significant swelling of the left lower extremity. We had discussed discussing this with his primary care provider he is not able to see her in person they were at the [facility] and right now the [place] is not seeing patients on site. According to the patient anyway. Subsequently he did speak with her apparently and his primary care provider feels that he may likely have a DVT. With that being said she has not seen his leg she is just going off of his history. Nonetheless that is a concern that the patient now has as well and while I do not feel the DVT is likely we can definitely ensure that that is not the case I will go ahead and see about putting that order in today. Nonetheless otherwise I am in a recommend that we continue with the current wound care measures including the compression therapy most likely. We just need to ensure that his leg is indeed free of any DVTs. 12/17/2018 on evaluation today patient actually appears to be completely healed today. He does have 2 very small areas of blistering although this is not anything too significant at this point which is good news. With that being said I am in agreement with the fact that I think he is completely healed at this point. He does want to get back into his compression stocking. The good news is we have gotten approval from insurance for his lymphedema pumps we received a letter since last saw him last week. The other good news is his study did come back and showed no evidence of a DVT. 12/20/2018 on evaluation today patient presents for follow-up concerning his ongoing issues with his left  lower extremity. He was actually discharged last Friday and did fairly well until he states blisters opened this morning. He tells me he has been wearing his compression stocking although he has a hard time getting this on. There does not appear to be any signs of active infection at this time. No fevers, chills,  nausea, vomiting, or diarrhea. 12/27/2018 on evaluation today patient appears to be doing very well with regard to his swelling of the left lower extremity the 4 layer compression wrap seems to have been beneficial for him. Fortunately there is no signs of active infection at this time. Patient has been tolerating the compression wrap without complication and his foot swelling in particular appears to be greatly improved. He does still have a wound on the lateral portion of his left leg I believe this is more of a blister that has now reopened. 01/03/2019 on evaluation today patient actually appears to be doing excellent in regard to his left lower extremity. He did receive his compression pumps and is actually use this 7 times since he was last here in the office. On top of the compression wrap he is now roughly 3 cm better at the calf and 2 cm better at the ankle he also states that his foot seem to go an issue better without even having to use a shoe horn. Obviously I think this is all evidence that he is doing excellent in this regard. The other good news is he does not appear to have anything open today as far as wounds are concerned. [name], [name] ([id number]) 01/15/2019 on evaluation today patient appears to be doing more poorly yet again with regard to his left lower extremity. He has developed new wounds again after being discharged just recently. Unfortunately this continues to be the case that he will heal and then have subsequent new wounds. The last time I was hopeful that he may not end up coming back too quickly especially since he states he has been using his lymphedema pumps along with wearing his compression. Nonetheless he had a blister on the back of his leg that popped up on the left and this has opened up into an ulceration it is quite painful. 01/22/19 on evaluation today patient actually appears to be doing well with regard to his wound on the left lower extremity. He's been  tolerating the dressing changes without complication including the compression wrap in the wound appears to be significantly smaller today which is great news. Overall very pleased in this regard. 01/29/2019 on evaluation today patient appears to be doing well with regard to his left posterior lower extremity ulcer. He has been tolerating the dressing changes without complication. This is not completely healed but is getting much closer. We did order a [place] wrap 4000 for him he has received this and has it with him today although I am not sure we are quite ready to start him on that as of yet. We are very close. Electronic Signature(s) Signed: 01/29/2019 5:22:56 PM By: Worthy Keeler PA-C Entered By: Worthy Keeler on 01/29/2019 17:22:56 [name] ([id number]) -------------------------------------------------------------------------------- Physical Exam Details Patient Name: [name] Date of Service: 01/29/2019 10:30 AM Medical Record Number: [id number] Patient Account Number: [id number] Date of Birth/Sex: 04/01/1951 (68 y.o. M) Treating RN: Montey Hora Primary Care Provider: SYSTEM, PCP Other Clinician: Referring Provider: Referral, Self Treating Provider/Extender: STONE III, [name] Weeks in Treatment: 74 Constitutional Well-nourished and well-hydrated in no acute distress. Respiratory normal breathing without difficulty. clear to  auscultation bilaterally. Cardiovascular regular rate and rhythm with normal S1, S2. Psychiatric this patient is able to make decisions and demonstrates good insight into disease process. Alert and Oriented x 3. pleasant and cooperative. Notes Patient's wound bed currently that did require some sharp debridement to clear away some of the necrotic tissue at this time from the surface of the wound. He tolerated this without complication post debridement the wound bed appears to be doing much better. Fortunately there is no evidence of active  infection at this time. Electronic Signature(s) Signed: 01/29/2019 5:23:39 PM By: Worthy Keeler PA-C Entered By: Worthy Keeler on 01/29/2019 17:23:39 [name] ([id number]) -------------------------------------------------------------------------------- Physician Orders Details Patient Name: [name] Date of Service: 01/29/2019 10:30 AM Medical Record Number: [id number] Patient Account Number: [id number] Date of Birth/Sex: Aug 02, 1950 (68 y.o. M) Treating RN: Montey Hora Primary Care Provider: SYSTEM, PCP Other Clinician: Referring Provider: Referral, Self Treating Provider/Extender: STONE III, [name] Weeks in Treatment: 11 Verbal / Phone Orders: No Diagnosis Coding ICD-10 Coding Code Description E11.622 Type 2 diabetes mellitus with other skin ulcer I89.0 Lymphedema, not elsewhere classified I87.2 Venous insufficiency (chronic) (peripheral) L97.822 Non-pressure chronic ulcer of other part of left lower leg with fat layer exposed I10 Essential (primary) hypertension Wound Cleansing Wound #8 Left,Posterior Lower Leg o May shower with protection. - Please do not get your wrap wet Primary Wound Dressing Wound #8 Left,Posterior Lower Leg o Hydrafera Blue Ready Transfer Secondary Dressing Wound #8 Left,Posterior Lower Leg o ABD pad Dressing Change Frequency Wound #8 Left,Posterior Lower Leg o Change dressing every week Follow-up Appointments Wound #8 Left,Posterior Lower Leg o Return Appointment in 1 week. Edema Control Wound #8 Left,Posterior Lower Leg o 3 Layer Compression System - Left Lower Extremity Electronic Signature(s) Signed: 01/29/2019 4:20:48 PM By: Montey Hora Signed: 01/29/2019 6:05:24 PM By: Worthy Keeler PA-C Entered By: Montey Hora on 01/29/2019 11:07:20 [name] ([id number]) -------------------------------------------------------------------------------- Problem List Details Patient Name: [name] Date of Service:  01/29/2019 10:30 AM Medical Record Number: [id number] Patient Account Number: [id number] Date of Birth/Sex: 01-05-1951 (68 y.o. M) Treating RN: Montey Hora Primary Care Provider: SYSTEM, PCP Other Clinician: Referring Provider: Referral, Self Treating Provider/Extender: Melburn Hake, [name] Weeks in Treatment: 11 Active Problems ICD-10 Evaluated Encounter Code Description Active Date Today Diagnosis E11.622 Type 2 diabetes mellitus with other skin ulcer 11/12/2018 No Yes I89.0 Lymphedema, not elsewhere classified 11/12/2018 No Yes I87.2 Venous insufficiency (chronic) (peripheral) 11/12/2018 No Yes L97.822 Non-pressure chronic ulcer of other part of left lower leg with 11/12/2018 No Yes fat layer exposed I10 Essential (primary) hypertension 11/12/2018 No Yes Inactive Problems Resolved Problems Electronic Signature(s) Signed: 01/29/2019 10:29:51 AM By: Worthy Keeler PA-C Entered By: Worthy Keeler on 01/29/2019 10:29:51 [name] ([id number]) -------------------------------------------------------------------------------- Progress Note Details Patient Name: [name] Date of Service: 01/29/2019 10:30 AM Medical Record Number: [id number] Patient Account Number: [id number] Date of Birth/Sex: 10/20/1950 (68 y.o. M) Treating RN: Montey Hora Primary Care Provider: SYSTEM, PCP Other Clinician: Referring Provider: Referral, Self Treating Provider/Extender: STONE III, [name] Weeks in Treatment: 11 Subjective Chief Complaint Information obtained from Patient Left LE ulcers History of Present Illness (HPI) 68 year old male who presented to the ER with bilateral lower extremity blisters which had started last week. he has a past medical history of leukemia, diabetes mellitus, hypertension, edema of both lower extremities, his recurrent skin infections, peripheral vascular disease, coronary artery disease, congestive heart failure and peripheral neuropathy. in the ER he was given Rocephin and  put on Silvadene  cream. he was put on oral doxycycline and was asked to follow-up with the [facility]. His last hemoglobin A1c was 6.6 in December and he checks his blood sugar once a week. He does not have any physicians outside the [place] system. He does not recall any vascular duplex studies done either for arterial or venous disease but was told to wear compression stockings which he does not use 05/30/2016 -- we have not yet received any of his notes from the [facility] hospital system and his arterial and venous duplex studies are scheduled here in [place] around mid February. We are unable to have his insurance accepted by home health agencies and hence he is getting dressings only once a week. 06/06/16 -- -- I received a call from the patient's PCP at the [facility] and spoke to Dr. Garvin Fila, phone number [phone] and fax number [phone]. She confirmed that no vascular testing was done over the last 5 years and she would be happy to do them if the patient did want them to be done at the [place] and we could fax him a request. Readmission: 68 year old male seen by as in February of this year and was referred to vein and vascular for studies and opinion from the vascular surgeons. The patient returns today with a fresh problem having had blisters on his left lower extremity which have been there for about 5 days and he clearly states that he has been wearing his compression stockings as advised though he could not read the moderate compression and has been wearing light compression. Review of his electronic medical records note that he had lower extremity arterial duplex examination done on 06/23/2016 which showed no hemodynamically significant stenosis in the bilateral lower extremity arterial system. He also had a lower extremity venous reflux examination done on 07/07/2016 and it was noted that he had venous incompetence in the right great saphenous vein and bilateral common femoral  veins. Patient was seen by Dr. Tamala Julian on the same day and for some reason his notes do not reflect the venous studies or the arterial studies and he recommended patient do a venous duplex ultrasound to look for reflux and return to see him.he would also consider a lymph pump if required. The patient was told that his workup was normal and hence the patient canceled his follow-up appointment. 02/03/17 on evaluation today patient left medial lower extremity blister appears to be doing about the same. It is still continuing to drain and there's still the blistered skin covering the wound bed which is making it difficult for the alternate to do its job. Fortunately there is no evidence of cellulitis. No fevers chills noted. Patient states in general he is not having any significant discomfort. Patient's lower extremity arterial duplex exam revealed that patient was hemodynamically stable with no evidence of stenosis in regard to the bilateral lower extremities. The lower extremity venous reflux exam revealed the patient had venous incontinence noted in the right greater saphenous [place], [name] ([id number]) and bilateral common femoral vein. There is no evidence of deep or superficial vein thrombosis in the bilateral lower extremities. Readmission: 11/12/18 Patient presents for evaluation our clinic today concerning issues that he is having with his left lower extremity. He tells me that a couple weeks ago he began developing blisters on the left lower extremity along with increased swelling. He typically wears his compression stockings on a regular basis is previously been evaluated both here as well is with vascular surgery they would  recommend lymphedema pumps but unfortunately that somehow fell through and he never heard anything back from that. Nonetheless I think lymphedema pumps would be beneficial for this patient. He does have a history of hypertension and diabetes. Obviously the chronic venous  stasis and lymphedema as well. At this point the blisters have been given in more trouble he states sometimes when the blisters openings able to clean it down with alcohol and it will dry out and do well. Unfortunately that has not been the case this time. He is having some discomfort although this mean these with cleaning the areas he doesn't have discomfort just on a regular basis. He has not been able to wear his compression stockings since the blisters arose due to the fact that of course it will drain into the socks causing additional issues and he didn't have any way to wrap this otherwise. He has increased to taking his Lasix every day instead of every other day. He sees his primary care provider later this month as well. No fevers, chills, nausea, or vomiting noted at this time. 11/19/18-Patient returns at 1 week, per intake RN the amount of seepage into the compression wraps was definitely improved, overall all the wounds are measuring smaller but continuing silver alginate to the wounds as primary dressing 11/26/18 on evaluation today patient appears to be doing quite well in regard to his left lower [place] ulcers. In fact of the areas that were noted initially he only has two regions still open. There is no evidence of active infection at this time. He still is not heard anything from the company regarding lymphedema pumps as of yet. Again as previously seen vascular they have not recommended any surgical intervention. 12/03/2018 on evaluation today patient actually appears to be doing quite well with regard to his lower extremity ulcers. In fact most of the areas appear to be healed the one spot which does not seem to be completely healed I am unsure of whether or not this is really draining that much but nonetheless there does not appear to be any signs of infection or significant drainage at this point. There is no sign of fever, chills, nausea, vomiting, or diarrhea. Overall I am pleased  with how things have progressed I think is very close to being able to transition to his home compression stockings. 12/10/2018 upon evaluation today patient appears to be doing quite well with regard to his left lower extremity. He has been tolerating the dressing changes without complication. Fortunately there is no signs of active infection at this time. He appears after thorough evaluation of his leg to only have 1 small area that remains open at this point everything else appears to be almost completely closed. He still have significant swelling of the left lower extremity. We had discussed discussing this with his primary care provider he is not able to see her in person they were at the [facility] and right now the [place] is not seeing patients on site. According to the patient anyway. Subsequently he did speak with her apparently and his primary care provider feels that he may likely have a DVT. With that being said she has not seen his leg she is just going off of his history. Nonetheless that is a concern that the patient now has as well and while I do not feel the DVT is likely we can definitely ensure that that is not the case I will go ahead and see about putting that order in today.  Nonetheless otherwise I am in a recommend that we continue with the current wound care measures including the compression therapy most likely. We just need to ensure that his leg is indeed free of any DVTs. 12/17/2018 on evaluation today patient actually appears to be completely healed today. He does have 2 very small areas of blistering although this is not anything too significant at this point which is good news. With that being said I am in agreement with the fact that I think he is completely healed at this point. He does want to get back into his compression stocking. The good news is we have gotten approval from insurance for his lymphedema pumps we received a letter since last saw him last week. The other  good news is his study did come back and showed no evidence of a DVT. 12/20/2018 on evaluation today patient presents for follow-up concerning his ongoing issues with his left lower extremity. He was actually discharged last Friday and did fairly well until he states blisters opened this morning. He tells me he has been wearing his compression stocking although he has a hard time getting this on. There does not appear to be any signs of active infection at this time. No fevers, chills, nausea, vomiting, or diarrhea. 12/27/2018 on evaluation today patient appears to be doing very well with regard to his swelling of the left lower extremity the 4 layer compression wrap seems to have been beneficial for him. Fortunately there is no signs of active infection at this time. Patient has been tolerating the compression wrap without complication and his foot swelling in particular appears to be greatly improved. He does still have a wound on the lateral portion of his left leg I believe this is more of a blister that has now reopened. [name], [name] ([id number]) 01/03/2019 on evaluation today patient actually appears to be doing excellent in regard to his left lower extremity. He did receive his compression pumps and is actually use this 7 times since he was last here in the office. On top of the compression wrap he is now roughly 3 cm better at the calf and 2 cm better at the ankle he also states that his foot seem to go an issue better without even having to use a shoe horn. Obviously I think this is all evidence that he is doing excellent in this regard. The other good news is he does not appear to have anything open today as far as wounds are concerned. 01/15/2019 on evaluation today patient appears to be doing more poorly yet again with regard to his left lower extremity. He has developed new wounds again after being discharged just recently. Unfortunately this continues to be the case that he will heal and  then have subsequent new wounds. The last time I was hopeful that he may not end up coming back too quickly especially since he states he has been using his lymphedema pumps along with wearing his compression. Nonetheless he had a blister on the back of his leg that popped up on the left and this has opened up into an ulceration it is quite painful. 01/22/19 on evaluation today patient actually appears to be doing well with regard to his wound on the left lower extremity. He's been tolerating the dressing changes without complication including the compression wrap in the wound appears to be significantly smaller today which is great news. Overall very pleased in this regard. 01/29/2019 on evaluation today patient appears to be doing well  with regard to his left posterior lower extremity ulcer. He has been tolerating the dressing changes without complication. This is not completely healed but is getting much closer. We did order a [place] wrap 4000 for him he has received this and has it with him today although I am not sure we are quite ready to start him on that as of yet. We are very close. Patient History Information obtained from Patient. Family History Cancer - Mother, Stroke - Father, No family history of Diabetes, Heart Disease, Hereditary Spherocytosis, Hypertension, Kidney Disease, Lung Disease, Seizures, Thyroid Problems, Tuberculosis. Social History Former smoker - quit 5 years ago, Marital Status - Divorced, Alcohol Use - Never, Drug Use - No History, Caffeine Use - Daily. Medical History Cardiovascular Patient has history of Hypertension, Peripheral Venous Disease Denies history of Angina, Arrhythmia, Congestive Heart Failure, Coronary Artery Disease, Deep Vein Thrombosis, Hypotension, Myocardial Infarction, Peripheral Arterial Disease, Phlebitis, Vasculitis Endocrine Patient has history of Type II Diabetes Integumentary (Skin) Denies history of History of Burn, History of  pressure wounds Neurologic Patient has history of Neuropathy Denies history of Dementia, Quadriplegia, Paraplegia, Seizure Disorder Oncologic Patient has history of Received Chemotherapy Denies history of Received Radiation Medical And Surgical History Notes 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 Respiratory Denies complaints or symptoms of Chronic or frequent coughs, Shortness of Breath. Cardiovascular [name], [name] ([id number]) Complains or has symptoms of LE edema. Denies complaints or symptoms of Chest pain. Psychiatric Denies complaints or symptoms of Anxiety, Claustrophobia. Objective Constitutional Well-nourished and well-hydrated in no acute distress. Vitals Time Taken: 10:37 AM, Height: 73 in, Weight: 300 lbs, BMI: 39.6, Temperature: 98.4 F, Pulse: 60 bpm, Respiratory Rate: 16 breaths/min, Blood Pressure: 128/60 mmHg. Respiratory normal breathing without difficulty. clear to auscultation bilaterally. Cardiovascular regular rate and rhythm with normal S1, S2. Psychiatric this patient is able to make decisions and demonstrates good insight into disease process. Alert and Oriented x 3. pleasant and cooperative. General Notes: Patient's wound bed currently that did require some sharp debridement to clear away some of the necrotic tissue at this time from the surface of the wound. He tolerated this without complication post debridement the wound bed appears to be doing much better. Fortunately there is no evidence of active infection at this time. Integumentary (Hair, Skin) Wound #8 status is Open. Original cause of wound was Gradually Appeared. The wound is located on the Left,Posterior Lower Leg. The wound measures 1cm length x 0.9cm width x 0.1cm depth; 0.707cm^2 area and 0.071cm^3 volume. There is Fat Layer (Subcutaneous  Tissue) Exposed exposed. There is no tunneling or undermining noted. There is a medium amount of serosanguineous drainage noted. The wound margin is flat and intact. There is large (67-100%) red granulation within the wound bed. There is a small (1-33%) amount of necrotic tissue within the wound bed including Adherent Slough. Assessment Active Problems ICD-10 Type 2 diabetes mellitus with other skin ulcer Lymphedema, not elsewhere classified Venous insufficiency (chronic) (peripheral) Non-pressure chronic ulcer of other part of left lower leg with fat layer exposed Essential (primary) hypertension [name], [name] ([id number]) Procedures Wound #8 Pre-procedure diagnosis of Wound #8 is a Diabetic Wound/Ulcer of the Lower Extremity located on the Left,Posterior Lower Leg .Severity of Tissue Pre Debridement is: Fat layer exposed. There was a Excisional Skin/Subcutaneous Tissue Debridement with a total area of 0.9 sq cm performed by STONE III, [name] E., PA-C.  With the following instrument(s): Curette to remove Viable and Non-Viable tissue/material. Material removed includes Subcutaneous Tissue and Slough and after achieving pain control using Lidocaine 4% Topical Solution. No specimens were taken. A time out was conducted at 11:04, prior to the start of the procedure. A Minimum amount of bleeding was controlled with Pressure. The procedure was tolerated well with a pain level of 0 throughout and a pain level of 0 following the procedure. Post Debridement Measurements: 1cm length x 0.9cm width x 0.2cm depth; 0.141cm^3 volume. Character of Wound/Ulcer Post Debridement is improved. Severity of Tissue Post Debridement is: Fat layer exposed. Post procedure Diagnosis Wound #8: Same as Pre-Procedure Plan Wound Cleansing: Wound #8 Left,Posterior Lower Leg: May shower with protection. - Please do not get your wrap wet Primary Wound Dressing: Wound #8 Left,Posterior Lower Leg: Hydrafera Blue Ready  Transfer Secondary Dressing: Wound #8 Left,Posterior Lower Leg: ABD pad Dressing Change Frequency: Wound #8 Left,Posterior Lower Leg: Change dressing every week Follow-up Appointments: Wound #8 Left,Posterior Lower Leg: Return Appointment in 1 week. Edema Control: Wound #8 Left,Posterior Lower Leg: 3 Layer Compression System - Left Lower Extremity 1. I would recommend currently that we go ahead and continue with the current wound care measures which includes the [facility] [place] dressing that seems to be doing well for him. 2. I recommend as well that we continue with a 3 layer compression wrap I think that is better than switching to his Velcro wrap at this time until we get the wound closed. 3. I am also get a recommend he continue to elevate his legs as well as using his lymphedema pumps on a regular basis 1 hour 2 times a day. We will see patient back for reevaluation in 1 week here in the clinic. If anything worsens or changes patient will contact our office for additional recommendations. [name], [name] ([id number]) Electronic Signature(s) Signed: 01/29/2019 5:24:19 PM By: Worthy Keeler PA-C Entered By: Worthy Keeler on 01/29/2019 17:24:19 [name] ([id number]) -------------------------------------------------------------------------------- ROS/PFSH Details Patient Name: [name] Date of Service: 01/29/2019 10:30 AM Medical Record Number: [id number] Patient Account Number: [id number] Date of Birth/Sex: [birth year] (68 y.o. M) Treating RN: Montey Hora Primary Care Provider: SYSTEM, PCP Other Clinician: Referring Provider: Referral, Self Treating Provider/Extender: STONE III, [name] Weeks in Treatment: 11 Information Obtained From Patient Constitutional Symptoms (General Health) Complaints and Symptoms: Negative for: Fatigue; Fever; Chills; Marked Weight Change Respiratory Complaints and Symptoms: Negative for: Chronic or frequent coughs; Shortness of  Breath Cardiovascular Complaints and Symptoms: Positive for: LE edema Negative for: Chest pain Medical History: Positive for: Hypertension; Peripheral Venous Disease Negative for: Angina; Arrhythmia; Congestive Heart Failure; Coronary Artery Disease; Deep Vein Thrombosis; Hypotension; Myocardial Infarction; Peripheral Arterial Disease; Phlebitis; Vasculitis Psychiatric Complaints and Symptoms: Negative for: Anxiety; Claustrophobia Endocrine Medical History: Positive for: Type II Diabetes Treated with: Oral agents Integumentary (Skin) Medical History: Negative for: History of Burn; History of pressure wounds Neurologic Medical History: Positive for: Neuropathy Negative for: Dementia; Quadriplegia; Paraplegia; Seizure Disorder Oncologic Medical History: Positive for: Received Chemotherapy [name], [name] ([id number]) Negative for: Received Radiation Past Medical History Notes: 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 Immunizations Pneumococcal Vaccine: Received Pneumococcal Vaccination: Yes Immunization Notes: up to date Implantable Devices None Family and Social History Cancer: Yes - Mother; Diabetes: No; Heart Disease: No; Hereditary Spherocytosis: No; Hypertension: No; Kidney Disease: No; Lung Disease: No; Seizures: No; Stroke: Yes - Father; Thyroid Problems: No; Tuberculosis: No; Former smoker - quit 5 years ago; Marital Status - Divorced; Alcohol Use:  Never; Drug Use: No History; Caffeine Use: Daily; Financial Concerns: No; Food, Clothing or Shelter Needs: No; Support System Lacking: No; Transportation Concerns: No Physician Affirmation I have reviewed and agree with the above information. Electronic Signature(s) Signed: 01/29/2019 6:05:24 PM By: Worthy Keeler PA-C Signed: 01/30/2019 5:03:49 PM By: Montey Hora Entered By: Worthy Keeler on 01/29/2019 17:23:27 [name]  ([id number]) -------------------------------------------------------------------------------- SuperBill Details Patient Name: [name] Date of Service: 01/29/2019 Medical Record Number: [id number] Patient Account Number: [id number] Date of Birth/Sex: 09/22/1950 (68 y.o. M) Treating RN: Montey Hora Primary Care Provider: SYSTEM, PCP Other Clinician: Referring Provider: Referral, Self Treating Provider/Extender: STONE III, [name] Weeks in Treatment: 11 Diagnosis Coding ICD-10 Codes Code Description E11.622 Type 2 diabetes mellitus with other skin ulcer I89.0 Lymphedema, not elsewhere classified I87.2 Venous insufficiency (chronic) (peripheral) L97.822 Non-pressure chronic ulcer of other part of left lower leg with fat layer exposed I10 Essential (primary) hypertension Facility Procedures CPT4 Code Description: [id number] - DEB SUBQ TISSUE 20 SQ CM/< ICD-10 Diagnosis Description L97.822 Non-pressure chronic ulcer of other part of left lower leg with Modifier: fat layer expos Quantity: 1 ed Physician Procedures CPT4 Code Description: [id number] - WC PHYS SUBQ TISS 20 SQ CM ICD-10 Diagnosis Description L97.822 Non-pressure chronic ulcer of other part of left lower leg with Modifier: fat layer expos Quantity: 1 ed Electronic Signature(s) Signed: 01/29/2019 5:24:33 PM By: Worthy Keeler PA-C Entered By: Worthy Keeler on 01/29/2019 17:24:33

## 2019-02-05 ENCOUNTER — Encounter: Payer: PPO | Admitting: Physician Assistant

## 2019-02-05 ENCOUNTER — Other Ambulatory Visit: Payer: Self-pay

## 2019-02-05 DIAGNOSIS — E11622 Type 2 diabetes mellitus with other skin ulcer: Secondary | ICD-10-CM | POA: Diagnosis not present

## 2019-02-05 DIAGNOSIS — I890 Lymphedema, not elsewhere classified: Secondary | ICD-10-CM | POA: Diagnosis not present

## 2019-02-05 DIAGNOSIS — L97222 Non-pressure chronic ulcer of left calf with fat layer exposed: Secondary | ICD-10-CM | POA: Diagnosis not present

## 2019-02-05 NOTE — Progress Notes (Addendum)
[name], Mooney ([id number]) Visit Report for 02/05/2019 Chief Complaint Document Details Patient Name: [name], [name] Date of Service: 02/05/2019 10:45 AM Medical Record Number: [id number] Patient Account Number: [id number] Date of Birth/Sex: 1951/04/09 (68 y.o. M) Treating RN: Montey Hora Primary Care Provider: SYSTEM, PCP Other Clinician: Referring Provider: Referral, Self Treating Provider/Extender: Melburn Hake, Sevan Mcbroom Weeks in Treatment: 12 Information Obtained from: Patient Chief Complaint Left LE ulcers Electronic Signature(s) Signed: 02/05/2019 10:59:48 AM By: Worthy Keeler PA-C Entered By: Worthy Keeler on 02/05/2019 10:59:48 [name] ([id number]) -------------------------------------------------------------------------------- HPI Details Patient Name: [name] Date of Service: 02/05/2019 10:45 AM Medical Record Number: [id number] Patient Account Number: [id number] Date of Birth/Sex: 22-Mar-1951 (68 y.o. M) Treating RN: Montey Hora Primary Care Provider: SYSTEM, PCP Other Clinician: Referring Provider: Referral, Self Treating Provider/Extender: STONE III, Xian Alves Weeks in Treatment: 12 History of Present Illness HPI Description: 68 year old male who presented to the ER with bilateral lower extremity blisters which had started last week. he has a past medical history of leukemia, diabetes mellitus, hypertension, edema of both lower extremities, his recurrent skin infections, peripheral vascular disease, coronary artery disease, congestive heart failure and peripheral neuropathy. in the ER he was given Rocephin and put on Silvadene cream. he was put on oral doxycycline and was asked to follow-up with the [facility]. His last hemoglobin A1c was 6.6 in December and he checks his blood sugar once a week. He does not have any physicians outside the [place] system. He does not recall any vascular duplex studies done either for arterial or venous disease but was told to wear  compression stockings which he does not use 05/30/2016 -- we have not yet received any of his notes from the [facility] hospital system and his arterial and venous duplex studies are scheduled here in [place] around mid February. We are unable to have his insurance accepted by home health agencies and hence he is getting dressings only once a week. 06/06/16 -- -- I received a call from the patient's PCP at the [facility] and spoke to Dr. Garvin Fila, phone number [phone] and fax number [phone]. She confirmed that no vascular testing was done over the last 5 years and she would be happy to do them if the patient did want them to be done at the [place] and we could fax him a request. Readmission: 68 year old male seen by as in February of this year and was referred to vein and vascular for studies and opinion from the vascular surgeons. The patient returns today with a fresh problem having had blisters on his left lower extremity which have been there for about 5 days and he clearly states that he has been wearing his compression stockings as advised though he could not read the moderate compression and has been wearing light compression. Review of his electronic medical records note that he had lower extremity arterial duplex examination done on 06/23/2016 which showed no hemodynamically significant stenosis in the bilateral lower extremity arterial system. He also had a lower extremity venous reflux examination done on 07/07/2016 and it was noted that he had venous incompetence in the right great saphenous vein and bilateral common femoral veins. Patient was seen by Dr. Tamala Julian on the same day and for some reason his notes do not reflect the venous studies or the arterial studies and he recommended patient do a venous duplex ultrasound to look for reflux and return to see him.he would also consider a lymph pump if required. The patient was told that  his workup was normal and hence the  patient canceled his follow-up appointment. 02/03/17 on evaluation today patient left medial lower extremity blister appears to be doing about the same. It is still continuing to drain and there's still the blistered skin covering the wound bed which is making it difficult for the alternate to do its job. Fortunately there is no evidence of cellulitis. No fevers chills noted. Patient states in general he is not having any significant discomfort. Patient's lower extremity arterial duplex exam revealed that patient was hemodynamically stable with no evidence of stenosis in regard to the bilateral lower extremities. The lower extremity venous reflux exam revealed the patient had venous incontinence noted in the right greater saphenous and bilateral common femoral vein. There is no evidence of deep or superficial vein thrombosis in the bilateral lower extremities. Readmission: [name], Mooney ([id number]) 11/12/18 Patient presents for evaluation our clinic today concerning issues that he is having with his left lower extremity. He tells me that a couple weeks ago he began developing blisters on the left lower extremity along with increased swelling. He typically wears his compression stockings on a regular basis is previously been evaluated both here as well is with vascular surgery they would recommend lymphedema pumps but unfortunately that somehow fell through and he never heard anything back from that. Nonetheless I think lymphedema pumps would be beneficial for this patient. He does have a history of hypertension and diabetes. Obviously the chronic venous stasis and lymphedema as well. At this point the blisters have been given in more trouble he states sometimes when the blisters openings able to clean it down with alcohol and it will dry out and do well. Unfortunately that has not been the case this time. He is having some discomfort although this mean these with cleaning the areas he doesn't have  discomfort just on a regular basis. He has not been able to wear his compression stockings since the blisters arose due to the fact that of course it will drain into the socks causing additional issues and he didn't have any way to wrap this otherwise. He has increased to taking his Lasix every day instead of every other day. He sees his primary care provider later this month as well. No fevers, chills, nausea, or vomiting noted at this time. 11/19/18-Patient returns at 1 week, per intake RN the amount of seepage into the compression wraps was definitely improved, overall all the wounds are measuring smaller but continuing silver alginate to the wounds as primary dressing 11/26/18 on evaluation today patient appears to be doing quite well in regard to his left lower Trinity ulcers. In fact of the areas that were noted initially he only has two regions still open. There is no evidence of active infection at this time. He still is not heard anything from the company regarding lymphedema pumps as of yet. Again as previously seen vascular they have not recommended any surgical intervention. 12/03/2018 on evaluation today patient actually appears to be doing quite well with regard to his lower extremity ulcers. In fact most of the areas appear to be healed the one spot which does not seem to be completely healed I am unsure of whether or not this is really draining that much but nonetheless there does not appear to be any signs of infection or significant drainage at this point. There is no sign of fever, chills, nausea, vomiting, or diarrhea. Overall I am pleased with how things have progressed I think is very close  to being able to transition to his home compression stockings. 12/10/2018 upon evaluation today patient appears to be doing quite well with regard to his left lower extremity. He has been tolerating the dressing changes without complication. Fortunately there is no signs of active infection at  this time. He appears after thorough evaluation of his leg to only have 1 small area that remains open at this point everything else appears to be almost completely closed. He still have significant swelling of the left lower extremity. We had discussed discussing this with his primary care provider he is not able to see her in person they were at the [facility] and right now the [place] is not seeing patients on site. According to the patient anyway. Subsequently he did speak with her apparently and his primary care provider feels that he may likely have a DVT. With that being said she has not seen his leg she is just going off of his history. Nonetheless that is a concern that the patient now has as well and while I do not feel the DVT is likely we can definitely ensure that that is not the case I will go ahead and see about putting that order in today. Nonetheless otherwise I am in a recommend that we continue with the current wound care measures including the compression therapy most likely. We just need to ensure that his leg is indeed free of any DVTs. 12/17/2018 on evaluation today patient actually appears to be completely healed today. He does have 2 very small areas of blistering although this is not anything too significant at this point which is good news. With that being said I am in agreement with the fact that I think he is completely healed at this point. He does want to get back into his compression stocking. The good news is we have gotten approval from insurance for his lymphedema pumps we received a letter since last saw him last week. The other good news is his study did come back and showed no evidence of a DVT. 12/20/2018 on evaluation today patient presents for follow-up concerning his ongoing issues with his left lower extremity. He was actually discharged last Friday and did fairly well until he states blisters opened this morning. He tells me he has been wearing his compression  stocking although he has a hard time getting this on. There does not appear to be any signs of active infection at this time. No fevers, chills, nausea, vomiting, or diarrhea. 12/27/2018 on evaluation today patient appears to be doing very well with regard to his swelling of the left lower extremity the 4 layer compression wrap seems to have been beneficial for him. Fortunately there is no signs of active infection at this time. Patient has been tolerating the compression wrap without complication and his foot swelling in particular appears to be greatly improved. He does still have a wound on the lateral portion of his left leg I believe this is more of a blister that has now reopened. 01/03/2019 on evaluation today patient actually appears to be doing excellent in regard to his left lower extremity. He did receive his compression pumps and is actually use this 7 times since he was last here in the office. On top of the compression wrap he is now roughly 3 cm better at the calf and 2 cm better at the ankle he also states that his foot seem to go an issue better without even having to use a shoe horn.  Obviously I think this is all evidence that he is doing excellent in this regard. The other good news is he does not appear to have anything open today as far as wounds are concerned. DACION, FAULK ([id number]) 01/15/2019 on evaluation today patient appears to be doing more poorly yet again with regard to his left lower extremity. He has developed new wounds again after being discharged just recently. Unfortunately this continues to be the case that he will heal and then have subsequent new wounds. The last time I was hopeful that he may not end up coming back too quickly especially since he states he has been using his lymphedema pumps along with wearing his compression. Nonetheless he had a blister on the back of his leg that popped up on the left and this has opened up into an ulceration it is quite  painful. 01/22/19 on evaluation today patient actually appears to be doing well with regard to his wound on the left lower extremity. He's been tolerating the dressing changes without complication including the compression wrap in the wound appears to be significantly smaller today which is great news. Overall very pleased in this regard. 01/29/2019 on evaluation today patient appears to be doing well with regard to his left posterior lower extremity ulcer. He has been tolerating the dressing changes without complication. This is not completely healed but is getting much closer. We did order a Farrow wrap 4000 for him he has received this and has it with him today although I am not sure we are quite ready to start him on that as of yet. We are very close. 02/05/2019 on evaluation today patient actually appears to be doing quite well with regard to his left posterior lower extremity ulcer. He still has a very tiny opening remaining but the fortunate thing is he seems to be healing quite nicely. He also did get his Farrow wrap which I am hoping will help with his edema control as well at home. Fortunately there is no evidence of active infection. Electronic Signature(s) Signed: 02/05/2019 12:49:56 PM By: Worthy Keeler PA-C Entered By: Worthy Keeler on 02/05/2019 12:49:56 [name] ([id number]) -------------------------------------------------------------------------------- Physical Exam Details Patient Name: [name] Date of Service: 02/05/2019 10:45 AM Medical Record Number: [id number] Patient Account Number: [id number] Date of Birth/Sex: 03/27/1951 (68 y.o. M) Treating RN: Montey Hora Primary Care Provider: SYSTEM, PCP Other Clinician: Referring Provider: Referral, Self Treating Provider/Extender: STONE III, Lenix Kidd Weeks in Treatment: 66 Constitutional Well-nourished and well-hydrated in no acute distress. Respiratory normal breathing without difficulty. clear to auscultation  bilaterally. Cardiovascular regular rate and rhythm with normal S1, S2. Psychiatric this patient is able to make decisions and demonstrates good insight into disease process. Alert and Oriented x 3. pleasant and cooperative. Notes Patient's wound bed currently showed signs of good granulation at this time there is no evidence of infection and excellent epithelization I am overall very pleased he seems to be tolerating the dressing changes well although I think he may be able to switch over to his Velcro compression wrap at this point and just use a dressing over top of this changing this at home and then we recheck with him in a week. Electronic Signature(s) Signed: 02/05/2019 12:50:31 PM By: Worthy Keeler PA-C Entered By: Worthy Keeler on 02/05/2019 12:50:31 [name] ([id number]) -------------------------------------------------------------------------------- Physician Orders Details Patient Name: [name] Date of Service: 02/05/2019 10:45 AM Medical Record Number: [id number] Patient Account Number: [id number] Date of Birth/Sex: 30-Jul-1950 (68 y.o. M)  Treating RN: Montey Hora Primary Care Provider: SYSTEM, PCP Other Clinician: Referring Provider: Referral, Self Treating Provider/Extender: STONE III, Swayzie Choate Weeks in Treatment: 12 Verbal / Phone Orders: No Diagnosis Coding ICD-10 Coding Code Description E11.622 Type 2 diabetes mellitus with other skin ulcer I89.0 Lymphedema, not elsewhere classified I87.2 Venous insufficiency (chronic) (peripheral) L97.822 Non-pressure chronic ulcer of other part of left lower leg with fat layer exposed I10 Essential (primary) hypertension Wound Cleansing Wound #8 Left,Posterior Lower Leg o Dial antibacterial soap, wash wounds, rinse and pat dry prior to dressing wounds o May Shower, gently pat wound dry prior to applying new dressing. Primary Wound Dressing Wound #8 Left,Posterior Lower Leg o Hydrafera Blue Ready  Transfer Secondary Dressing Wound #8 Left,Posterior Lower Leg o ABD and Kerlix/Conform Dressing Change Frequency Wound #8 Left,Posterior Lower Leg o Change dressing every other day. - every 2 to 3 days Follow-up Appointments Wound #8 Left,Posterior Lower Leg o Return Appointment in 1 week. Edema Control Wound #8 Left,Posterior Lower Leg o Patient to wear own Velcro compression garment. o Elevate legs to the level of the heart and pump ankles as often as possible o Compression Pump: Use compression pump on left lower extremity for 60 minutes, twice daily. o Compression Pump: Use compression pump on right lower extremity for 60 minutes, twice daily. Electronic Signature(s) Signed: 02/05/2019 4:22:21 PM By: Montey Hora Signed: 02/06/2019 9:08:00 AM By: Irean Hong Norris, [place] ([id number]) Entered By: Montey Hora on 02/05/2019 11:27:06 [name] ([id number]) -------------------------------------------------------------------------------- Problem List Details Patient Name: OLAN, LOUGHNANE Date of Service: 02/05/2019 10:45 AM Medical Record Number: [id number] Patient Account Number: [id number] Date of Birth/Sex: [birth year] (68 y.o. M) Treating RN: Montey Hora Primary Care Provider: SYSTEM, PCP Other Clinician: Referring Provider: Referral, Self Treating Provider/Extender: Melburn Hake, Patt Steinhardt Weeks in Treatment: 12 Active Problems ICD-10 Evaluated Encounter Code Description Active Date Today Diagnosis E11.622 Type 2 diabetes mellitus with other skin ulcer 11/12/2018 No Yes I89.0 Lymphedema, not elsewhere classified 11/12/2018 No Yes I87.2 Venous insufficiency (chronic) (peripheral) 11/12/2018 No Yes L97.822 Non-pressure chronic ulcer of other part of left lower leg with 11/12/2018 No Yes fat layer exposed I10 Essential (primary) hypertension 11/12/2018 No Yes Inactive Problems Resolved Problems Electronic Signature(s) Signed: 02/05/2019 10:59:41 AM By: Worthy Keeler PA-C Entered By: Worthy Keeler on 02/05/2019 10:59:41 [name] ([id number]) -------------------------------------------------------------------------------- Progress Note Details Patient Name: [name] Date of Service: 02/05/2019 10:45 AM Medical Record Number: [id number] Patient Account Number: [id number] Date of Birth/Sex: [birth year] (68 y.o. M) Treating RN: Montey Hora Primary Care Provider: SYSTEM, PCP Other Clinician: Referring Provider: Referral, Self Treating Provider/Extender: STONE III, Yamin Swingler Weeks in Treatment: 12 Subjective Chief Complaint Information obtained from Patient Left LE ulcers History of Present Illness (HPI) 68 year old male who presented to the ER with bilateral lower extremity blisters which had started last week. he has a past medical history of leukemia, diabetes mellitus, hypertension, edema of both lower extremities, his recurrent skin infections, peripheral vascular disease, coronary artery disease, congestive heart failure and peripheral neuropathy. in the ER he was given Rocephin and put on Silvadene cream. he was put on oral doxycycline and was asked to follow-up with the [facility]. His last hemoglobin A1c was 6.6 in December and he checks his blood sugar once a week. He does not have any physicians outside the [place] system. He does not recall any vascular duplex studies done either for arterial or venous disease but was told to wear compression stockings which he does not use 05/30/2016 --  we have not yet received any of his notes from the [place] hospital system and his arterial and venous duplex studies are scheduled here in [place] around mid February. We are unable to have his insurance accepted by home health agencies and hence he is getting dressings only once a week. 06/06/16 -- -- I received a call from the patient's PCP at the [facility] and spoke to Dr. Garvin Fila, phone number [phone] and fax number [phone]. She  confirmed that no vascular testing was done over the last 5 years and she would be happy to do them if the patient did want them to be done at the [place] and we could fax him a request. Readmission: 68 year old male seen by as in February of this year and was referred to vein and vascular for studies and opinion from the vascular surgeons. The patient returns today with a fresh problem having had blisters on his left lower extremity which have been there for about 5 days and he clearly states that he has been wearing his compression stockings as advised though he could not read the moderate compression and has been wearing light compression. Review of his electronic medical records note that he had lower extremity arterial duplex examination done on 06/23/2016 which showed no hemodynamically significant stenosis in the bilateral lower extremity arterial system. He also had a lower extremity venous reflux examination done on 07/07/2016 and it was noted that he had venous incompetence in the right great saphenous vein and bilateral common femoral veins. Patient was seen by Dr. Tamala Julian on the same day and for some reason his notes do not reflect the venous studies or the arterial studies and he recommended patient do a venous duplex ultrasound to look for reflux and return to see him.he would also consider a lymph pump if required. The patient was told that his workup was normal and hence the patient canceled his follow-up appointment. 02/03/17 on evaluation today patient left medial lower extremity blister appears to be doing about the same. It is still continuing to drain and there's still the blistered skin covering the wound bed which is making it difficult for the alternate to do its job. Fortunately there is no evidence of cellulitis. No fevers chills noted. Patient states in general he is not having any significant discomfort. Patient's lower extremity arterial duplex exam revealed that patient was  hemodynamically stable with no evidence of stenosis in regard to the bilateral lower extremities. The lower extremity venous reflux exam revealed the patient had venous incontinence noted in the right greater saphenous Kief, [name] ([id number]) and bilateral common femoral vein. There is no evidence of deep or superficial vein thrombosis in the bilateral lower extremities. Readmission: 11/12/18 Patient presents for evaluation our clinic today concerning issues that he is having with his left lower extremity. He tells me that a couple weeks ago he began developing blisters on the left lower extremity along with increased swelling. He typically wears his compression stockings on a regular basis is previously been evaluated both here as well is with vascular surgery they would recommend lymphedema pumps but unfortunately that somehow fell through and he never heard anything back from that. Nonetheless I think lymphedema pumps would be beneficial for this patient. He does have a history of hypertension and diabetes. Obviously the chronic venous stasis and lymphedema as well. At this point the blisters have been given in more trouble he states sometimes when the blisters openings able to clean it down with alcohol  and it will dry out and do well. Unfortunately that has not been the case this time. He is having some discomfort although this mean these with cleaning the areas he doesn't have discomfort just on a regular basis. He has not been able to wear his compression stockings since the blisters arose due to the fact that of course it will drain into the socks causing additional issues and he didn't have any way to wrap this otherwise. He has increased to taking his Lasix every day instead of every other day. He sees his primary care provider later this month as well. No fevers, chills, nausea, or vomiting noted at this time. 11/19/18-Patient returns at 1 week, per intake RN the amount of seepage into  the compression wraps was definitely improved, overall all the wounds are measuring smaller but continuing silver alginate to the wounds as primary dressing 11/26/18 on evaluation today patient appears to be doing quite well in regard to his left lower Trinity ulcers. In fact of the areas that were noted initially he only has two regions still open. There is no evidence of active infection at this time. He still is not heard anything from the company regarding lymphedema pumps as of yet. Again as previously seen vascular they have not recommended any surgical intervention. 12/03/2018 on evaluation today patient actually appears to be doing quite well with regard to his lower extremity ulcers. In fact most of the areas appear to be healed the one spot which does not seem to be completely healed I am unsure of whether or not this is really draining that much but nonetheless there does not appear to be any signs of infection or significant drainage at this point. There is no sign of fever, chills, nausea, vomiting, or diarrhea. Overall I am pleased with how things have progressed I think is very close to being able to transition to his home compression stockings. 12/10/2018 upon evaluation today patient appears to be doing quite well with regard to his left lower extremity. He has been tolerating the dressing changes without complication. Fortunately there is no signs of active infection at this time. He appears after thorough evaluation of his leg to only have 1 small area that remains open at this point everything else appears to be almost completely closed. He still have significant swelling of the left lower extremity. We had discussed discussing this with his primary care provider he is not able to see her in person they were at the [facility] and right now the [place] is not seeing patients on site. According to the patient anyway. Subsequently he did speak with her apparently and his primary  care provider feels that he may likely have a DVT. With that being said she has not seen his leg she is just going off of his history. Nonetheless that is a concern that the patient now has as well and while I do not feel the DVT is likely we can definitely ensure that that is not the case I will go ahead and see about putting that order in today. Nonetheless otherwise I am in a recommend that we continue with the current wound care measures including the compression therapy most likely. We just need to ensure that his leg is indeed free of any DVTs. 12/17/2018 on evaluation today patient actually appears to be completely healed today. He does have 2 very small areas of blistering although this is not anything too significant at this point which is good news.  With that being said I am in agreement with the fact that I think he is completely healed at this point. He does want to get back into his compression stocking. The good news is we have gotten approval from insurance for his lymphedema pumps we received a letter since last saw him last week. The other good news is his study did come back and showed no evidence of a DVT. 12/20/2018 on evaluation today patient presents for follow-up concerning his ongoing issues with his left lower extremity. He was actually discharged last Friday and did fairly well until he states blisters opened this morning. He tells me he has been wearing his compression stocking although he has a hard time getting this on. There does not appear to be any signs of active infection at this time. No fevers, chills, nausea, vomiting, or diarrhea. 12/27/2018 on evaluation today patient appears to be doing very well with regard to his swelling of the left lower extremity the 4 layer compression wrap seems to have been beneficial for him. Fortunately there is no signs of active infection at this time. Patient has been tolerating the compression wrap without complication and his foot  swelling in particular appears to be greatly improved. He does still have a wound on the lateral portion of his left leg I believe this is more of a blister that has now reopened. THELONIOUS, OGLESBEE ([id number]) 01/03/2019 on evaluation today patient actually appears to be doing excellent in regard to his left lower extremity. He did receive his compression pumps and is actually use this 7 times since he was last here in the office. On top of the compression wrap he is now roughly 3 cm better at the calf and 2 cm better at the ankle he also states that his foot seem to go an issue better without even having to use a shoe horn. Obviously I think this is all evidence that he is doing excellent in this regard. The other good news is he does not appear to have anything open today as far as wounds are concerned. 01/15/2019 on evaluation today patient appears to be doing more poorly yet again with regard to his left lower extremity. He has developed new wounds again after being discharged just recently. Unfortunately this continues to be the case that he will heal and then have subsequent new wounds. The last time I was hopeful that he may not end up coming back too quickly especially since he states he has been using his lymphedema pumps along with wearing his compression. Nonetheless he had a blister on the back of his leg that popped up on the left and this has opened up into an ulceration it is quite painful. 01/22/19 on evaluation today patient actually appears to be doing well with regard to his wound on the left lower extremity. He's been tolerating the dressing changes without complication including the compression wrap in the wound appears to be significantly smaller today which is great news. Overall very pleased in this regard. 01/29/2019 on evaluation today patient appears to be doing well with regard to his left posterior lower extremity ulcer. He has been tolerating the dressing changes without  complication. This is not completely healed but is getting much closer. We did order a Farrow wrap 4000 for him he has received this and has it with him today although I am not sure we are quite ready to start him on that as of yet. We are very close. 02/05/2019 on evaluation  today patient actually appears to be doing quite well with regard to his left posterior lower extremity ulcer. He still has a very tiny opening remaining but the fortunate thing is he seems to be healing quite nicely. He also did get his Farrow wrap which I am hoping will help with his edema control as well at home. Fortunately there is no evidence of active infection. Patient History Information obtained from Patient. Family History Cancer - Mother, Stroke - Father, No family history of Diabetes, Heart Disease, Hereditary Spherocytosis, Hypertension, Kidney Disease, Lung Disease, Seizures, Thyroid Problems, Tuberculosis. Social History Former smoker - quit 5 years ago, Marital Status - Divorced, Alcohol Use - Never, Drug Use - No History, Caffeine Use - Daily. Medical History Cardiovascular Patient has history of Hypertension, Peripheral Venous Disease Denies history of Angina, Arrhythmia, Congestive Heart Failure, Coronary Artery Disease, Deep Vein Thrombosis, Hypotension, Myocardial Infarction, Peripheral Arterial Disease, Phlebitis, Vasculitis Endocrine Patient has history of Type II Diabetes Integumentary (Skin) Denies history of History of Burn, History of pressure wounds Neurologic Patient has history of Neuropathy Denies history of Dementia, Quadriplegia, Paraplegia, Seizure Disorder Oncologic Patient has history of Received Chemotherapy Denies history of Received Radiation Medical And Surgical History Notes 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 Respiratory Denies complaints or symptoms of Chronic or frequent coughs, Shortness of Breath. Cardiovascular Complains or has symptoms of LE edema. Denies complaints or symptoms of Chest pain. Psychiatric Denies complaints or symptoms of Anxiety, Claustrophobia. Objective Constitutional Well-nourished and well-hydrated in no acute distress. Vitals Time Taken: 11:00 AM, Height: 73 in, Weight: 300 lbs, BMI: 39.6, Temperature: 99.2 F, Pulse: 53 bpm, Respiratory Rate: 18 breaths/min, Blood Pressure: 157/79 mmHg. Respiratory normal breathing without difficulty. clear to auscultation bilaterally. Cardiovascular regular rate and rhythm with normal S1, S2. Psychiatric this patient is able to make decisions and demonstrates good insight into disease process. Alert and Oriented x 3. pleasant and cooperative. General Notes: Patient's wound bed currently showed signs of good granulation at this time there is no evidence of infection and excellent epithelization I am overall very pleased he seems to be tolerating the dressing changes well although I think he may be able to switch over to his Velcro compression wrap at this point and just use a dressing over top of this changing this at home and then we recheck with him in a week. Integumentary (Hair, Skin) Wound #8 status is Open. Original cause of wound was Gradually Appeared. The wound is located on the Left,Posterior Lower Leg. The wound measures 0.5cm length x 0.5cm width x 0.1cm depth; 0.196cm^2 area and 0.02cm^3 volume. There is Fat Layer (Subcutaneous Tissue) Exposed exposed. There is no tunneling or undermining noted. There is a medium amount of serosanguineous drainage noted. The wound margin is flat and intact. There is large (67-100%) red granulation within the wound bed. There is a small (1-33%) amount of necrotic tissue within the wound bed including Eschar and Adherent Slough. Assessment Active  Problems NAHIR, DAURIO ([id number]) ICD-10 Type 2 diabetes mellitus with other skin ulcer Lymphedema, not elsewhere classified Venous insufficiency (chronic) (peripheral) Non-pressure chronic ulcer of other part of left lower leg with fat layer exposed Essential (primary) hypertension Plan Wound Cleansing: Wound #8 Left,Posterior Lower Leg: Dial antibacterial soap, wash wounds, rinse and pat dry prior to dressing wounds May Shower, gently pat wound dry prior to applying new  dressing. Primary Wound Dressing: Wound #8 Left,Posterior Lower Leg: Hydrafera Blue Ready Transfer Secondary Dressing: Wound #8 Left,Posterior Lower Leg: ABD and Kerlix/Conform Dressing Change Frequency: Wound #8 Left,Posterior Lower Leg: Change dressing every other day. - every 2 to 3 days Follow-up Appointments: Wound #8 Left,Posterior Lower Leg: Return Appointment in 1 week. Edema Control: Wound #8 Left,Posterior Lower Leg: Patient to wear own Velcro compression garment. Elevate legs to the level of the heart and pump ankles as often as possible Compression Pump: Use compression pump on left lower extremity for 60 minutes, twice daily. Compression Pump: Use compression pump on right lower extremity for 60 minutes, twice daily. 1. I would recommend that we go ahead and continue with the [facility] dressing as it seems to be doing well. 2. subsequently we will also go ahead and have him switch to his Wallie Char wrap that he will use in place of the compression wrap and we will see how things do over the next week this will be a good trial for him as well considering the fact these often come back shortly after being discharged with new openings. 3. I still recommend the patient continue to elevate his legs as much as possible as well as using the lymphedema pumps 60 minutes twice a day. We will see patient back for reevaluation in 1 week here in the clinic. If anything worsens or changes patient will contact  our office for additional recommendations. Electronic Signature(s) Signed: 02/05/2019 12:51:27 PM By: Worthy Keeler PA-C Entered By: Worthy Keeler on 02/05/2019 12:51:27 [name] ([id number]) -------------------------------------------------------------------------------- ROS/PFSH Details Patient Name: [name] Date of Service: 02/05/2019 10:45 AM Medical Record Number: [id number] Patient Account Number: [id number] Date of Birth/Sex: 03-02-1951 (68 y.o. M) Treating RN: Montey Hora Primary Care Provider: SYSTEM, PCP Other Clinician: Referring Provider: Referral, Self Treating Provider/Extender: STONE III, Davin Archuletta Weeks in Treatment: 12 Information Obtained From Patient Constitutional Symptoms (General Health) Complaints and Symptoms: Negative for: Fatigue; Fever; Chills; Marked Weight Change Respiratory Complaints and Symptoms: Negative for: Chronic or frequent coughs; Shortness of Breath Cardiovascular Complaints and Symptoms: Positive for: LE edema Negative for: Chest pain Medical History: Positive for: Hypertension; Peripheral Venous Disease Negative for: Angina; Arrhythmia; Congestive Heart Failure; Coronary Artery Disease; Deep Vein Thrombosis; Hypotension; Myocardial Infarction; Peripheral Arterial Disease; Phlebitis; Vasculitis Psychiatric Complaints and Symptoms: Negative for: Anxiety; Claustrophobia Endocrine Medical History: Positive for: Type II Diabetes Treated with: Oral agents Integumentary (Skin) Medical History: Negative for: History of Burn; History of pressure wounds Neurologic Medical History: Positive for: Neuropathy Negative for: Dementia; Quadriplegia; Paraplegia; Seizure Disorder Oncologic Medical History: Positive for: Received Chemotherapy JASSIEL, TESFAI ([id number]) Negative for: Received Radiation Past Medical History Notes: 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 Immunizations Pneumococcal Vaccine: Received Pneumococcal Vaccination:  Yes Immunization Notes: up to date Implantable Devices None Family and Social History Cancer: Yes - Mother; Diabetes: No; Heart Disease: No; Hereditary Spherocytosis: No; Hypertension: No; Kidney Disease: No; Lung Disease: No; Seizures: No; Stroke: Yes - Father; Thyroid Problems: No; Tuberculosis: No; Former smoker - quit 5 years ago; Marital Status - Divorced; Alcohol Use: Never; Drug Use: No History; Caffeine Use: Daily; Financial Concerns: No; Food, Clothing or Shelter Needs: No; Support System Lacking: No; Transportation Concerns: No Physician Affirmation I have reviewed and agree with the above information. Electronic Signature(s) Signed: 02/05/2019 4:22:21 PM By: Montey Hora Signed: 02/06/2019 9:08:00 AM By: Worthy Keeler PA-C Entered By: Worthy Keeler on 02/05/2019 12:50:17 [name] ([id number]) -------------------------------------------------------------------------------- SuperBill Details Patient Name: JORDI, MCGILLEN  Date of Service: 02/05/2019 Medical Record Number: [id number] Patient Account Number: [id number] Date of Birth/Sex: 09-23-1950 (68 y.o. M) Treating RN: Montey Hora Primary Care Provider: SYSTEM, PCP Other Clinician: Referring Provider: Referral, Self Treating Provider/Extender: STONE III, Haru Anspaugh Weeks in Treatment: 12 Diagnosis Coding ICD-10 Codes Code Description E11.622 Type 2 diabetes mellitus with other skin ulcer I89.0 Lymphedema, not elsewhere classified I87.2 Venous insufficiency (chronic) (peripheral) L97.822 Non-pressure chronic ulcer of other part of left lower leg with fat layer exposed I10 Essential (primary) hypertension Facility Procedures CPT4 Code: [id number] Description: 99213 - WOUND CARE VISIT-LEV 3 EST PT Modifier: Quantity: 1 Physician Procedures CPT4 Code Description: [id number] - WC PHYS LEVEL 4 - EST PT ICD-10 Diagnosis Description E11.622 Type 2 diabetes mellitus with other skin ulcer I89.0 Lymphedema, not elsewhere  classified I87.2 Venous insufficiency (chronic) (peripheral) L97.822  Non-pressure chronic ulcer of other part of left lower leg wit Modifier: h fat layer expos Quantity: 1 ed Electronic Signature(s) Signed: 02/05/2019 12:51:42 PM By: Worthy Keeler PA-C Entered By: Worthy Keeler on 02/05/2019 12:51:42

## 2019-02-06 NOTE — Progress Notes (Signed)
TRAI, NASH ([id number]) Visit Report for 02/05/2019 Arrival Information Details Patient Name: [name], MAST Date of Service: 02/05/2019 10:45 AM Medical Record Number: [id number] Patient Account Number: [id number] Date of Birth/Sex: [birth year] (68 y.o. M) Treating RN: Cornell Barman Primary Care Bryahna Lesko: SYSTEM, PCP Other Clinician: Referring Tamber Burtch: Referral, Self Treating Viraaj Vorndran/Extender: STONE III, [name] Weeks in Treatment: 12 Visit Information History Since Last Visit Added or deleted any medications: No Patient Arrived: Ambulatory Any new allergies or adverse reactions: No Arrival Time: 10:51 Had a fall or experienced change in No Accompanied By: self activities of daily living that may affect Transfer Assistance: None risk of falls: Patient Identification Verified: Yes Signs or symptoms of abuse/neglect since last visito No Secondary Verification Process Completed: Yes Hospitalized since last visit: No Patient Has Alerts: Yes Has Compression in Place as Prescribed: Yes Patient Alerts: DMII Pain Present Now: No Electronic Signature(s) Signed: 02/06/2019 5:01:16 PM By: Gretta Cool, BSN, RN, CWS, Kim RN, BSN Entered By: Gretta Cool, BSN, RN, CWS, Kim on 02/05/2019 10:51:34 [name] ([id number]) -------------------------------------------------------------------------------- Clinic Level of Care Assessment Details Patient Name: [name] Date of Service: 02/05/2019 10:45 AM Medical Record Number: [id number] Patient Account Number: [id number] Date of Birth/Sex: Aug 07, 1950 (68 y.o. M) Treating RN: Montey Hora Primary Care Koralyn Prestage: SYSTEM, PCP Other Clinician: Referring Yordi Krager: Referral, Self Treating Valeen Borys/Extender: STONE III, [name] Weeks in Treatment: 12 Clinic Level of Care Assessment Items TOOL 4 Quantity Score []  - Use when only an EandM is performed on FOLLOW-UP visit 0 ASSESSMENTS - Nursing Assessment / Reassessment X - Reassessment of Co-morbidities (includes  updates in patient status) 1 10 X- 1 5 Reassessment of Adherence to Treatment Plan ASSESSMENTS - Wound and Skin Assessment / Reassessment X - Simple Wound Assessment / Reassessment - one wound 1 5 []  - 0 Complex Wound Assessment / Reassessment - multiple wounds []  - 0 Dermatologic / Skin Assessment (not related to wound area) ASSESSMENTS - Focused Assessment X - Circumferential Edema Measurements - multi extremities 1 5 []  - 0 Nutritional Assessment / Counseling / Intervention X- 1 5 Lower Extremity Assessment (monofilament, tuning fork, pulses) []  - 0 Peripheral Arterial Disease Assessment (using hand held doppler) ASSESSMENTS - Ostomy and/or Continence Assessment and Care []  - Incontinence Assessment and Management 0 []  - 0 Ostomy Care Assessment and Management (repouching, etc.) PROCESS - Coordination of Care X - Simple Patient / Family Education for ongoing care 1 15 []  - 0 Complex (extensive) Patient / Family Education for ongoing care X- 1 10 Staff obtains Programmer, systems, Records, Test Results / Process Orders []  - 0 Staff telephones HHA, Nursing Homes / Clarify orders / etc []  - 0 Routine Transfer to another Facility (non-emergent condition) []  - 0 Routine Hospital Admission (non-emergent condition) []  - 0 New Admissions / Biomedical engineer / Ordering NPWT, Apligraf, etc. []  - 0 Emergency Hospital Admission (emergent condition) X- 1 10 Simple Discharge Coordination [place], JAYJAY ([id number]) []  - 0 Complex (extensive) Discharge Coordination PROCESS - Special Needs []  - Pediatric / Minor Patient Management 0 []  - 0 Isolation Patient Management []  - 0 Hearing / Language / Visual special needs []  - 0 Assessment of Community assistance (transportation, D/C planning, etc.) []  - 0 Additional assistance / Altered mentation []  - 0 Support Surface(s) Assessment (bed, cushion, seat, etc.) INTERVENTIONS - Wound Cleansing / Measurement X - Simple Wound Cleansing -  one wound 1 5 []  - 0 Complex Wound Cleansing - multiple wounds X- 1 5 Wound Imaging (photographs - any number of wounds) []  -  0 Wound Tracing (instead of photographs) X- 1 5 Simple Wound Measurement - one wound []  - 0 Complex Wound Measurement - multiple wounds INTERVENTIONS - Wound Dressings []  - Small Wound Dressing one or multiple wounds 0 X- 1 15 Medium Wound Dressing one or multiple wounds []  - 0 Large Wound Dressing one or multiple wounds []  - 0 Application of Medications - topical []  - 0 Application of Medications - injection INTERVENTIONS - Miscellaneous []  - External ear exam 0 []  - 0 Specimen Collection (cultures, biopsies, blood, body fluids, etc.) []  - 0 Specimen(s) / Culture(s) sent or taken to Lab for analysis []  - 0 Patient Transfer (multiple staff / Civil Service fast streamer / Similar devices) []  - 0 Simple Staple / Suture removal (25 or less) []  - 0 Complex Staple / Suture removal (26 or more) []  - 0 Hypo / Hyperglycemic Management (close monitor of Blood Glucose) []  - 0 Ankle / Brachial Index (ABI) - do not check if billed separately X- 1 5 Vital Signs KENDRICK, LEDYARD ([id number]) Has the patient been seen at the hospital within the last three years: Yes Total Score: 100 Level Of Care: New/Established - Level 3 Electronic Signature(s) Signed: 02/05/2019 4:22:21 PM By: Montey Hora Entered By: Montey Hora on 02/05/2019 11:42:27 [name] ([id number]) -------------------------------------------------------------------------------- Encounter Discharge Information Details Patient Name: [name] Date of Service: 02/05/2019 10:45 AM Medical Record Number: [id number] Patient Account Number: [id number] Date of Birth/Sex: 08/20/1950 (68 y.o. M) Treating RN: Montey Hora Primary Care Camyla Camposano: SYSTEM, PCP Other Clinician: Referring Mazi Schuff: Referral, Self Treating Basilia Stuckert/Extender: STONE III, [name] Weeks in Treatment: 12 Encounter Discharge Information  Items Discharge Condition: Stable Ambulatory Status: Ambulatory Discharge Destination: Home Transportation: Private Auto Accompanied By: self Schedule Follow-up Appointment: Yes Clinical Summary of Care: Electronic Signature(s) Signed: 02/05/2019 4:22:21 PM By: Montey Hora Entered By: Montey Hora on 02/05/2019 11:38:30 [name] ([id number]) -------------------------------------------------------------------------------- Lower Extremity Assessment Details Patient Name: [name] Date of Service: 02/05/2019 10:45 AM Medical Record Number: [id number] Patient Account Number: [id number] Date of Birth/Sex: Dec 24, 1950 (68 y.o. M) Treating RN: Cornell Barman Primary Care Laya Letendre: SYSTEM, PCP Other Clinician: Referring Kalden Wanke: Referral, Self Treating Kathalene Sporer/Extender: STONE III, [name] Weeks in Treatment: 12 Edema Assessment Assessed: [Left: No] [Right: No] [Left: Edema] [Right: :] Calf Left: Right: Point of Measurement: 352 cm From Medial Instep 41 cm cm Ankle Left: Right: Point of Measurement: 11 cm From Medial Instep 30.4 cm cm Vascular Assessment Pulses: Dorsalis Pedis Palpable: [Left:Yes] Posterior Tibial Palpable: [Left:Yes] Electronic Signature(s) Signed: 02/06/2019 5:01:16 PM By: Gretta Cool, BSN, RN, CWS, Kim RN, BSN Entered By: Gretta Cool, BSN, RN, CWS, Kim on 02/05/2019 11:04:51 [name] ([id number]) -------------------------------------------------------------------------------- Multi Wound Chart Details Patient Name: [name] Date of Service: 02/05/2019 10:45 AM Medical Record Number: [id number] Patient Account Number: [id number] Date of Birth/Sex: Nov 05, 1950 (68 y.o. M) Treating RN: Montey Hora Primary Care Dakai Braithwaite: SYSTEM, PCP Other Clinician: Referring Kamber Vignola: Referral, Self Treating Orval Dortch/Extender: STONE III, [name] Weeks in Treatment: 12 Vital Signs Height(in): 73 Pulse(bpm): 53 Weight(lbs): 300 Blood Pressure(mmHg): 157/79 Body Mass  Index(BMI): 40 Temperature(F): 99.2 Respiratory Rate 18 (breaths/min): Photos: [N/A:N/A] Wound Location: Left, Posterior Lower Leg N/A N/A Wounding Event: Gradually Appeared N/A N/A Primary Etiology: Diabetic Wound/Ulcer of the N/A N/A Lower Extremity Comorbid History: Hypertension, Peripheral N/A N/A Venous Disease, Type II Diabetes, Neuropathy, Received Chemotherapy Date Acquired: 01/01/2019 N/A N/A Weeks of Treatment: 3 N/A N/A Wound Status: Open N/A N/A Measurements L x W x D 0.5x0.5x0.1 N/A N/A (cm) Area (cm) : 0.196 N/A N/A  Volume (cm) : 0.02 N/A N/A % Reduction in Area: 92.70% N/A N/A % Reduction in Volume: 92.50% N/A N/A Classification: Grade 2 N/A N/A Exudate Amount: Medium N/A N/A Exudate Type: Serosanguineous N/A N/A Exudate Color: red, brown N/A N/A Wound Margin: Flat and Intact N/A N/A Granulation Amount: Large (67-100%) N/A N/A Granulation Quality: Red N/A N/A Necrotic Amount: Small (1-33%) N/A N/A Necrotic Tissue: Eschar, Adherent Slough N/A N/A Exposed Structures: Fat Layer (Subcutaneous N/A N/A Tissue) Exposed: Yes Fascia: No Cecil, [name] ([id number]) Tendon: No Muscle: No Joint: No Bone: No Epithelialization: None N/A N/A Treatment Notes Electronic Signature(s) Signed: 02/05/2019 4:22:21 PM By: Montey Hora Entered By: Montey Hora on 02/05/2019 11:26:06 [name] ([id number]) -------------------------------------------------------------------------------- Multi-Disciplinary Care Plan Details Patient Name: [name] Date of Service: 02/05/2019 10:45 AM Medical Record Number: [id number] Patient Account Number: [id number] Date of Birth/Sex: 1950-07-05 (68 y.o. M) Treating RN: Montey Hora Primary Care Jonessa Triplett: SYSTEM, PCP Other Clinician: Referring Galileah Piggee: Referral, Self Treating Libbey Duce/Extender: STONE III, [name] Weeks in Treatment: 12 Active Inactive Venous Leg Ulcer Nursing Diagnoses: Actual venous Insuffiency (use after  diagnosis is confirmed) Knowledge deficit related to disease process and management Goals: Patient will maintain optimal edema control Date Initiated: 11/12/2018 Target Resolution Date: 03/08/2019 Goal Status: Active Patient/caregiver will verbalize understanding of disease process and disease management Date Initiated: 11/12/2018 Target Resolution Date: 03/08/2019 Goal Status: Active Interventions: Assess peripheral edema status every visit. Compression as ordered Provide education on venous insufficiency Notes: Wound/Skin Impairment Nursing Diagnoses: Impaired tissue integrity Knowledge deficit related to ulceration/compromised skin integrity Goals: Ulcer/skin breakdown will have a volume reduction of 30% by week 4 Date Initiated: 11/12/2018 Target Resolution Date: 03/08/2019 Goal Status: Active Interventions: Assess patient/caregiver ability to obtain necessary supplies Assess patient/caregiver ability to perform ulcer/skin care regimen upon admission and as needed Assess ulceration(s) every visit Provide education on ulcer and skin care Notes: BREKIN, ASEL ([id number]) Electronic Signature(s) Signed: 02/05/2019 4:22:21 PM By: Montey Hora Entered By: Montey Hora on 02/05/2019 11:25:59 [name] ([id number]) -------------------------------------------------------------------------------- Pain Assessment Details Patient Name: [name] Date of Service: 02/05/2019 10:45 AM Medical Record Number: [id number] Patient Account Number: [id number] Date of Birth/Sex: [birth year] (68 y.o. M) Treating RN: Cornell Barman Primary Care Taseen Marasigan: SYSTEM, PCP Other Clinician: Referring Briante Loveall: Referral, Self Treating Bowden Boody/Extender: STONE III, [name] Weeks in Treatment: 12 Active Problems Location of Pain Severity and Description of Pain Patient Has Paino No Site Locations Pain Management and Medication Current Pain Management: Goals for Pain Management Patient denies pain at  this time. Electronic Signature(s) Signed: 02/06/2019 5:01:16 PM By: Gretta Cool, BSN, RN, CWS, Kim RN, BSN Entered By: Gretta Cool, BSN, RN, CWS, Kim on 02/05/2019 10:51:49 [name] ([id number]) -------------------------------------------------------------------------------- Patient/Caregiver Education Details Patient Name: [name] Date of Service: 02/05/2019 10:45 AM Medical Record Number: [id number] Patient Account Number: [id number] Date of Birth/Gender: 04/01/1951 (68 y.o. M) Treating RN: Montey Hora Primary Care Physician: SYSTEM, PCP Other Clinician: Referring Physician: Referral, Self Treating Physician/Extender: Melburn Hake, [name] Weeks in Treatment: 12 Education Assessment Education Provided To: Patient Education Topics Provided Venous: Handouts: Other: how to use farrow wraps Methods: Demonstration, Explain/Verbal Responses: State content correctly Electronic Signature(s) Signed: 02/05/2019 4:22:21 PM By: Montey Hora Entered By: Montey Hora on 02/05/2019 11:42:54 [name] ([id number]) -------------------------------------------------------------------------------- Wound Assessment Details Patient Name: [name] Date of Service: 02/05/2019 10:45 AM Medical Record Number: [id number] Patient Account Number: [id number] Date of Birth/Sex: 07-12-1950 (68 y.o. M) Treating RN: Montey Hora Primary Care Lagena Strand: SYSTEM, PCP Other Clinician: Referring Ermias Tomeo: Referral, Self Treating Ceriah Kohler/Extender: Joaquim Lai  III, [name] Weeks in Treatment: 12 Wound Status Wound Number: 8 Primary Diabetic Wound/Ulcer of the Lower Extremity Etiology: Wound Location: Left, Posterior Lower Leg Wound Open Wounding Event: Gradually Appeared Status: Date Acquired: 01/01/2019 Comorbid Hypertension, Peripheral Venous Disease, Type Weeks Of Treatment: 3 History: II Diabetes, Neuropathy, Received Clustered Wound: No Chemotherapy Photos Wound Measurements Length: (cm) 0.5 %  Reductio Width: (cm) 0.5 % Reductio Depth: (cm) 0.1 Epithelial Area: (cm) 0.196 Tunneling Volume: (cm) 0.02 Undermini n in Area: 92.7% n in Volume: 92.5% ization: None : No ng: No Wound Description Classification: Grade 2 Foul Odor Wound Margin: Flat and Intact Slough/Fi Exudate Amount: Medium Exudate Type: Serosanguineous Exudate Color: red, brown After Cleansing: No brino Yes Wound Bed Granulation Amount: Large (67-100%) Exposed Structure Granulation Quality: Red Fascia Exposed: No Necrotic Amount: Small (1-33%) Fat Layer (Subcutaneous Tissue) Exposed: Yes Necrotic Quality: Eschar, Adherent Slough Tendon Exposed: No Muscle Exposed: No Joint Exposed: No Bone Exposed: No Treatment Notes TAYDEN, GRABENSTEIN ([id number]) Wound #8 (Left, Posterior Lower Leg) Notes hydrofera blue, ABD, conform to secure, patient's own compression wrap Electronic Signature(s) Signed: 02/05/2019 4:22:21 PM By: Montey Hora Entered By: Montey Hora on 02/05/2019 11:25:52 [name] ([id number]) -------------------------------------------------------------------------------- [place] Details Patient Name: [name] Date of Service: 02/05/2019 10:45 AM Medical Record Number: [id number] Patient Account Number: [id number] Date of Birth/Sex: 03/10/1951 (68 y.o. M) Treating RN: Cornell Barman Primary Care [name] Longanecker: SYSTEM, PCP Other Clinician: Referring Reign Bartnick: Referral, Self Treating Ji Fairburn/Extender: STONE III, [name] Weeks in Treatment: 12 Vital Signs Time Taken: 11:00 Temperature (F): 99.2 Height (in): 73 Pulse (bpm): 53 Weight (lbs): 300 Respiratory Rate (breaths/min): 18 Body Mass Index (BMI): 39.6 Blood Pressure (mmHg): 157/79 Reference Range: 80 - 120 mg / dl Electronic Signature(s) Signed: 02/06/2019 5:01:16 PM By: Gretta Cool, BSN, RN, CWS, Kim RN, BSN Entered By: Gretta Cool, BSN, RN, CWS, Kim on 02/05/2019 11:01:07

## 2019-02-10 ENCOUNTER — Encounter: Payer: Self-pay | Admitting: Emergency Medicine

## 2019-02-10 ENCOUNTER — Ambulatory Visit
Admission: EM | Admit: 2019-02-10 | Discharge: 2019-02-10 | Disposition: A | Discharge status: Home / Self Care | Payer: PPO

## 2019-02-10 ENCOUNTER — Other Ambulatory Visit: Payer: Self-pay

## 2019-02-10 DIAGNOSIS — I872 Venous insufficiency (chronic) (peripheral): Secondary | ICD-10-CM | POA: Diagnosis not present

## 2019-02-10 NOTE — Discharge Instructions (Signed)
Elevate your leg above the level of your heart sufficiently for 20 to 30 minutes 3 times daily.  When you are up ambulating be sure to use your compression hose.  Follow-up on Tuesday with [facility] wound care.  You will not need to remove the dressings until you go to for your appointment.

## 2019-02-10 NOTE — ED Provider Notes (Signed)
[facility]    CSN: [id number] Arrival date & time: 02/10/19  1218      History   Chief Complaint Chief Complaint  Patient presents with  . Wound Check    HPI [name] is a 68 y.o. male.   HPI  68 year old male diabetic with chronic venous stasis both lower extremities presents with left leg wound started draining this morning.  He is a patient at the [facility] wound clinic and is seen on once a week.  He has a new appointment on Tuesday of this week.  Been attending the wound clinic since June 2020.  He had dressing on the leg that was supposedly removed 3 days ago.  He noticed that he has a blister on the posterior aspect of his calf both lateral and medial.  The lateral one has been draining and he was concerned he may need antibiotics.  Review of available medical records shows he is under the care of [facility].  He has been seen in our clinic on 2018 at which time he had received Rocephin IM doxycycline p.o. and a Silvadene cream for an extensive ulcer that he had at that time.  He has not been running any fevers he is currently afebrile.  He receives primary care through the [facility] in [place] but has not received any wound care from them and is using the wound center for that purpose.      Past Medical History:  Diagnosis Date  . Diabetes mellitus without complication ([place])   . Hyperlipidemia   . Hypertension   . Leukemia [facility])     Patient Active Problem List   Diagnosis Date Noted  . Chronic venous insufficiency 07/07/2016  . Venous stasis dermatitis of both lower extremities 07/07/2016  . Lymphedema 07/07/2016  . Diabetes ([place]) 07/07/2016  . Essential hypertension 07/07/2016  . Hyperlipidemia 07/07/2016    Past Surgical History:  Procedure Laterality Date  . CATARACT EXTRACTION Right   . TONSILLECTOMY    . TOOTH EXTRACTION         Home Medications    Prior to Admission medications   Medication Sig Start Date End Date Taking?  Authorizing Provider  bisoprolol-hydrochlorothiazide (ZIAC) 10-6.25 MG tablet Take 1 tablet by mouth daily.   Yes [provider]  furosemide (LASIX) 20 MG tablet Take 20 mg by mouth.   Yes [provider]  glimepiride (AMARYL) 4 MG tablet  06/16/16  Yes [provider]  hydrALAZINE (APRESOLINE) 10 MG tablet Take 10 mg by mouth 3 (three) times daily.   Yes [provider]  lisinopril-hydrochlorothiazide (PRINZIDE,ZESTORETIC) 20-12.5 MG tablet Take 1 tablet by mouth daily.   Yes [provider]  metFORMIN (GLUCOPHAGE) 1000 MG tablet Take 1,000 mg by mouth 2 (two) times daily with a meal.   Yes [provider]  pioglitazone (ACTOS) 15 MG tablet Take 15 mg by mouth daily.   Yes [provider]  pravastatin (PRAVACHOL) 80 MG tablet Take 80 mg by mouth daily.   Yes [provider]    Family History Family History  Problem Relation Age of Onset  . Cancer Mother   . Stroke Father   . Cancer Sister     Social History Social History   Tobacco Use  . Smoking status: Former Research scientist (life sciences)  . Smokeless tobacco: Never Used  Substance Use Topics  . Alcohol use: No  . Drug use: No     Allergies   Patient has no known  allergies.   Review of Systems Review of Systems  Constitutional: Positive for activity change. Negative for appetite change, chills, fatigue and fever.  Skin: Positive for color change and wound.  All other systems reviewed and are negative.    Physical Exam Triage Vital Signs ED Triage Vitals  Enc Vitals Group     BP 02/10/19 1238 133/66     Pulse Rate 02/10/19 1238 70     Resp --      Temp 02/10/19 1238 98.2 F (36.8 C)     Temp Source 02/10/19 1238 Oral     SpO2 02/10/19 1238 96 %     Weight 02/10/19 1234 (!) 350 lb (158.8 kg)     Height --      Head Circumference --      Peak Flow --      Pain Score 02/10/19 1234 0     Pain Loc --      Pain Edu? --      Excl. in [place]? --    No data found.   Updated Vital Signs BP 133/66 (BP Location: Left Arm)   Pulse 70   Temp 98.2 F (36.8 C) (Oral)   Wt (!) 350 lb (158.8 kg)   SpO2 96%   BMI 46.18 kg/m   Visual Acuity Right Eye Distance:   Left Eye Distance:   Bilateral Distance:    Right Eye Near:   Left Eye Near:    Bilateral Near:     Physical Exam Vitals signs and nursing note reviewed.  Constitutional:      General: He is not in acute distress.    Appearance: Normal appearance. He is obese. He is not ill-appearing, toxic-appearing or diaphoretic.  HENT:     Head: Normocephalic and atraumatic.  Eyes:     Conjunctiva/sclera: Conjunctivae normal.  Neck:     Musculoskeletal: Normal range of motion and neck supple.  Cardiovascular:     Rate and Rhythm: Normal rate and regular rhythm.     Heart sounds: Normal heart sounds.  Pulmonary:     Effort: Pulmonary effort is normal.     Breath sounds: Normal breath sounds.  Musculoskeletal:        General: Swelling and tenderness present.     Right lower leg: Edema present.     Left lower leg: Edema present.  Skin:    General: Skin is warm and dry.     Comments: Examination of the left lower extremity shows chronic venous stasis changes from mid calf distally.  He does have swelling of the left foot.  He has a blister on the lateral left calf that has lost epidermis down to the dermis but does not appear to be infected.  Is draining clear fluid.  Is a similar ulcer on the medial aspect which is slightly deeper but again does not show any significant infection.  Neurological:     General: No focal deficit present.     Mental Status: He is alert and oriented to person, place, and time.  Psychiatric:        Mood and Affect: Mood normal.        Behavior: Behavior normal.        Thought Content: Thought content normal.        Judgment: Judgment normal.      UC Treatments / Results  Labs (all labs ordered are listed, but only abnormal results are displayed) Labs Reviewed -  No data to display  EKG  Radiology No results found.  Procedures Procedures (including critical care time)  Medications Ordered in UC Medications - No data to display  Initial Impression / Assessment and Plan / UC Course  I have reviewed the triage vital signs and the nursing notes.  Pertinent labs & imaging results that were available during my care of the patient were reviewed by me and considered in my medical decision making (see chart for details).   68 year old male with chronic venous stasis with skin breakdown and ulcers presents because of draining from a posterior left calf ulcer.  There is no evidence of infection at this time.  Follow-up appointment on Tuesday with the wound clinic.  Is reassured that there is no evidence of infection and there is no need for oral antibiotics.  We will place Silvadene cream on these areas and wrapped them with a gauze dressing.  Leave this in place until his follow-up on Tuesday at the wound clinic.  He was encouraged to elevate his leg sufficiently to reduce swelling.  Recommended 20 to 30 minutes 3 times a day.  When he is up ambulating he will continue to use his compression hose.   Final Clinical Impressions(s) / UC Diagnoses   Final diagnoses:  Venous stasis dermatitis of left lower extremity     Discharge Instructions     Elevate your leg above the level of your heart sufficiently for 20 to 30 minutes 3 times daily.  When you are up ambulating be sure to use your compression hose.  Follow-up on Tuesday with  wound care.  You will not need to remove the dressings until you go to for your appointment.    ED Prescriptions    None     PDMP not reviewed this encounter.   Lorin Picket, PA-C 02/10/19 1819

## 2019-02-10 NOTE — ED Triage Notes (Addendum)
Wound on left leg that started draining this morning. Already sees  wound Clinic center and his next visit is Tuesday. drainage /swelling and blister on back of left leg  Denies: Fever  Per patient sees Wound Clinic center once a week. This has been ongoing since June 2020

## 2019-02-12 ENCOUNTER — Other Ambulatory Visit: Payer: Self-pay

## 2019-02-12 ENCOUNTER — Encounter: Payer: PPO | Attending: Physician Assistant | Admitting: Physician Assistant

## 2019-02-12 DIAGNOSIS — Z6839 Body mass index (BMI) 39.0-39.9, adult: Secondary | ICD-10-CM | POA: Insufficient documentation

## 2019-02-12 DIAGNOSIS — I878 Other specified disorders of veins: Secondary | ICD-10-CM | POA: Insufficient documentation

## 2019-02-12 DIAGNOSIS — L97822 Non-pressure chronic ulcer of other part of left lower leg with fat layer exposed: Secondary | ICD-10-CM | POA: Insufficient documentation

## 2019-02-12 DIAGNOSIS — I2510 Atherosclerotic heart disease of native coronary artery without angina pectoris: Secondary | ICD-10-CM | POA: Diagnosis not present

## 2019-02-12 DIAGNOSIS — Z881 Allergy status to other antibiotic agents status: Secondary | ICD-10-CM | POA: Insufficient documentation

## 2019-02-12 DIAGNOSIS — E11622 Type 2 diabetes mellitus with other skin ulcer: Secondary | ICD-10-CM | POA: Insufficient documentation

## 2019-02-12 DIAGNOSIS — I509 Heart failure, unspecified: Secondary | ICD-10-CM | POA: Diagnosis not present

## 2019-02-12 DIAGNOSIS — I890 Lymphedema, not elsewhere classified: Secondary | ICD-10-CM | POA: Insufficient documentation

## 2019-02-12 DIAGNOSIS — I872 Venous insufficiency (chronic) (peripheral): Secondary | ICD-10-CM | POA: Diagnosis not present

## 2019-02-12 DIAGNOSIS — E669 Obesity, unspecified: Secondary | ICD-10-CM | POA: Insufficient documentation

## 2019-02-12 DIAGNOSIS — I110 Hypertensive heart disease with heart failure: Secondary | ICD-10-CM | POA: Diagnosis not present

## 2019-02-12 DIAGNOSIS — Z809 Family history of malignant neoplasm, unspecified: Secondary | ICD-10-CM | POA: Diagnosis not present

## 2019-02-12 DIAGNOSIS — Z87891 Personal history of nicotine dependence: Secondary | ICD-10-CM | POA: Diagnosis not present

## 2019-02-12 DIAGNOSIS — E1151 Type 2 diabetes mellitus with diabetic peripheral angiopathy without gangrene: Secondary | ICD-10-CM | POA: Insufficient documentation

## 2019-02-12 DIAGNOSIS — E1142 Type 2 diabetes mellitus with diabetic polyneuropathy: Secondary | ICD-10-CM | POA: Diagnosis not present

## 2019-02-12 DIAGNOSIS — L97829 Non-pressure chronic ulcer of other part of left lower leg with unspecified severity: Secondary | ICD-10-CM | POA: Diagnosis not present

## 2019-02-12 DIAGNOSIS — L97222 Non-pressure chronic ulcer of left calf with fat layer exposed: Secondary | ICD-10-CM | POA: Diagnosis not present

## 2019-02-12 DIAGNOSIS — Z856 Personal history of leukemia: Secondary | ICD-10-CM | POA: Insufficient documentation

## 2019-02-12 NOTE — Progress Notes (Signed)
[name], [name] ([id number]) Visit Report for 02/12/2019 Arrival Information Details Patient Name: [name] Date of Service: 02/12/2019 10:45 AM Medical Record Number: [id number] Patient Account Number: [id number] Date of Birth/Sex: 28-Sep-1950 (68 y.o. M) Treating RN: Montey Hora Primary Care Karyme Mcconathy: SYSTEM, PCP Other Clinician: Referring Elizabella Nolet: Referral, Self Treating Janila Arrazola/Extender: STONE III, [name] Weeks in Treatment: 13 Visit Information History Since Last Visit Added or deleted any medications: No Patient Arrived: Ambulatory Any new allergies or adverse reactions: No Arrival Time: 10:52 Had a fall or experienced change in No Accompanied By: self activities of daily living that may affect Transfer Assistance: None risk of falls: Patient Identification Verified: Yes Signs or symptoms of abuse/neglect since last visito No Secondary Verification Process Completed: Yes Hospitalized since last visit: No Patient Has Alerts: Yes Implantable device outside of the clinic excluding No Patient Alerts: DMII cellular tissue based products placed in the center since last visit: Has Dressing in Place as Prescribed: Yes Pain Present Now: No Electronic Signature(s) Signed: 02/12/2019 4:06:56 PM By: Lorine Bears RCP, RRT, CHT Entered By: Lorine Bears on 02/12/2019 10:52:47 [name] ([id number]) -------------------------------------------------------------------------------- Encounter Discharge Information Details Patient Name: [name] Date of Service: 02/12/2019 10:45 AM Medical Record Number: [id number] Patient Account Number: [id number] Date of Birth/Sex: 04/23/1951 (68 y.o. M) Treating RN: Montey Hora Primary Care Leen Tworek: SYSTEM, PCP Other Clinician: Referring Kynslei Art: Referral, Self Treating Ingri Diemer/Extender: STONE III, [name] Weeks in Treatment: 13 Encounter Discharge Information Items Post Procedure Vitals Discharge  Condition: Stable Temperature (F): 98.2 Ambulatory Status: Ambulatory Pulse (bpm): 58 Discharge Destination: Home Respiratory Rate (breaths/min): 16 Transportation: Private Auto Blood Pressure (mmHg): 146/64 Accompanied By: self Schedule Follow-up Appointment: Yes Clinical Summary of Care: Electronic Signature(s) Signed: 02/12/2019 11:38:28 AM By: Montey Hora Entered By: Montey Hora on 02/12/2019 11:38:28 [name] ([id number]) -------------------------------------------------------------------------------- Lower Extremity Assessment Details Patient Name: [name] Date of Service: 02/12/2019 10:45 AM Medical Record Number: [id number] Patient Account Number: [id number] Date of Birth/Sex: 1950-12-01 (68 y.o. M) Treating RN: Cornell Mooney Primary Care Makella Buckingham: SYSTEM, PCP Other Clinician: Referring Jarrell Armond: Referral, Self Treating Cooper Stamp/Extender: STONE III, [name] Weeks in Treatment: 13 Edema Assessment Assessed: [Left: No] [Right: No] [Left: Edema] [Right: :] Calf Left: Right: Point of Measurement: 35 cm From Medial Instep 44 cm cm Ankle Left: Right: Point of Measurement: 11 cm From Medial Instep 32 cm cm Vascular Assessment Pulses: Dorsalis Pedis Palpable: [Left:Yes] Notes leg is red and swollen. Electronic Signature(s) Signed: 02/12/2019 5:14:16 PM By: Gretta Cool, BSN, RN, CWS, Kim RN, BSN Entered By: Gretta Cool, BSN, RN, CWS, Kim on 02/12/2019 11:03:56 [name] ([id number]) -------------------------------------------------------------------------------- Multi Wound Chart Details Patient Name: [name] Date of Service: 02/12/2019 10:45 AM Medical Record Number: [id number] Patient Account Number: [id number] Date of Birth/Sex: 09-27-1950 (68 y.o. 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 N/A Lower Extremity Comorbid History: N/A Hypertension, Peripheral N/A Venous Disease, Type II Diabetes, Neuropathy, Received Chemotherapy Date Acquired: 01/01/2019 02/10/2019 N/A Weeks of Treatment: 4 0 N/A Wound Status: Open Open N/A Clustered Wound: No Yes N/A Clustered Quantity: N/A 2 N/A Measurements L x W x  D 6x2.6x0.1 2.2x1.2x0.1 N/A (cm) Area (cm) : 12.252 2.073 N/A Volume (cm) : 1.225 0.207 N/A % Reduction in Area: -358.90% 0.00% N/A % Reduction in Volume: -358.80% 0.00% N/A Classification: Grade 2 Full Thickness Without N/A Exposed Support Structures Exudate Amount: N/A Large N/A Exudate Type: N/A Serous N/A Exudate Color: N/A amber N/A Wound Margin: N/A Flat and Intact N/A Granulation Amount: N/A Large (67-100%) N/A Granulation Quality: N/A Red N/A Necrotic Amount: N/A None Present (0%) N/A [name], [name] ([id number]) Treatment Notes Electronic Signature(s) Signed: 02/12/2019 4:54:06 PM By: Montey Hora Entered By: Montey Hora on 02/12/2019 11:25:59 [name] ([id number]) -------------------------------------------------------------------------------- [place] Details Patient Name: [name] Date of Service: 02/12/2019 10:45 AM Medical Record Number: [id number] Patient Account Number: [id number] Date of Birth/Sex: [birth year] (68 y.o. M) Treating RN: Montey Hora Primary Care Allon Costlow: SYSTEM, PCP Other Clinician: Referring Kinston Magnan: Referral, Self Treating Emeterio Balke/Extender: STONE III, [name] Weeks in Treatment: 13 Active Inactive Venous Leg  Ulcer Nursing Diagnoses: Actual venous Insuffiency (use after diagnosis is confirmed) Knowledge deficit related to disease process and management Goals: Patient will maintain optimal edema control Date Initiated: 11/12/2018 Target Resolution Date: 03/08/2019 Goal Status: Active Patient/caregiver will verbalize understanding of disease process and disease management Date Initiated: 11/12/2018 Target Resolution Date: 03/08/2019 Goal Status: Active Interventions: Assess peripheral edema status every visit. Compression as ordered Provide education on venous insufficiency Notes: Wound/Skin Impairment Nursing Diagnoses: Impaired tissue integrity Knowledge deficit related to ulceration/compromised skin integrity Goals: Ulcer/skin breakdown will have a volume reduction of 30% by week 4 Date Initiated: 11/12/2018 Target Resolution Date: 03/08/2019 Goal Status: Active Interventions: Assess patient/caregiver ability to obtain necessary supplies Assess patient/caregiver ability to perform ulcer/skin care regimen upon admission and as needed Assess ulceration(s) every visit Provide education on ulcer and skin care Notes: [name], [name] ([id number]) Electronic Signature(s) Signed: 02/12/2019 4:54:06 PM By: Montey Hora Entered By: Montey Hora on 02/12/2019 11:25:45 [name] ([id number]) -------------------------------------------------------------------------------- Pain Assessment Details Patient Name: [name] Date of Service: 02/12/2019 10:45 AM Medical Record Number: [id number] Patient Account Number: [id number] Date of Birth/Sex: 28-Feb-1951 (68 y.o. M) Treating RN: Montey Hora Primary Care Gini Caputo: SYSTEM, PCP Other Clinician: Referring Spring San: Referral, Self Treating Kody Vigil/Extender: STONE III, [name] Weeks in Treatment: 13 Active Problems Location of Pain Severity and Description of Pain Patient Has Paino No Site Locations Pain Management and Medication Current  Pain Management: Electronic Signature(s) Signed: 02/12/2019 4:06:56 PM By: Lorine Bears RCP, RRT, CHT Signed: 02/12/2019 4:54:06 PM By: Montey Hora Entered By: Lorine Bears on 02/12/2019 10:52:56 [name] ([id number]) -------------------------------------------------------------------------------- Patient/Caregiver Education Details Patient Name: [name] Date of Service: 02/12/2019 10:45 AM Medical Record Number: [id number] Patient Account Number: [id number] Date of Birth/Gender: [birth year] (68 y.o. M) Treating RN: Montey Hora Primary Care Physician: SYSTEM, PCP Other Clinician: Referring Physician: Referral, Self Treating Physician/Extender: Melburn Hake, [name] Weeks in Treatment: 13 Education Assessment Education Provided To: Patient Education Topics Provided Venous: Handouts: Other: continue using pumps Methods: Explain/Verbal Responses: State content correctly Electronic Signature(s) Signed: 02/12/2019 4:54:06 PM By: Montey Hora Entered By: Montey Hora on 02/12/2019 11:37:14 [name] ([id number]) -------------------------------------------------------------------------------- Wound Assessment Details Patient Name: [name] Date of Service: 02/12/2019 10:45 AM Medical Record Number: [id number] Patient Account Number: [id number] Date of Birth/Sex: [birth year] (68 y.o. M) Treating RN: Cornell Mooney Primary Care Ottie Neglia: SYSTEM, PCP Other Clinician: Referring Jamesa Tedrick: Referral, Self Treating Herbert Aguinaldo/Extender: STONE III, [name] Weeks in Treatment: 13 Wound Status Wound Number: 8 Primary Diabetic Wound/Ulcer of the Lower Extremity Etiology: Wound Location: Left Lower Leg -  Posterior Wound Open Wounding Event: Blister Status: Date Acquired: 01/01/2019 Comorbid Hypertension, Peripheral Venous Disease, Type Weeks Of Treatment: 4 History: II Diabetes, Neuropathy, Received Clustered Wound: No Chemotherapy Photos Wound  Measurements Length: (cm) 6 Width: (cm) 2.6 Depth: (cm) 0.1 Area: (cm) 12.252 Volume: (cm) 1.225 % Reduction in Area: -358.9% % Reduction in Volume: -358.8% Epithelialization: None Wound Description Classification: Grade 2 Wound Margin: Flat and Intact Exudate Amount: Medium Exudate Type: Serosanguineous Exudate Color: red, brown Foul Odor After Cleansing: No Slough/Fibrino Yes Wound Bed Granulation Amount: Large (67-100%) Exposed Structure Granulation Quality: Red Fascia Exposed: No Necrotic Amount: Small (1-33%) Fat Layer (Subcutaneous Tissue) Exposed: Yes Necrotic Quality: Eschar, Adherent Slough Tendon Exposed: No Muscle Exposed: No Joint Exposed: No Bone Exposed: No Treatment Notes [name], [name] ([id number]) Wound #8 (Left, Posterior Lower Leg) Notes hydrofera blue, abd, 3 layer wrap with unna to anchor Electronic Signature(s) Signed: 02/12/2019 4:54:06 PM By: Montey Hora Signed: 02/12/2019 5:14:16 PM By: Gretta Cool, BSN, RN, CWS, Kim RN, BSN Entered By: Montey Hora on 02/12/2019 11:33:42 [name] ([id number]) -------------------------------------------------------------------------------- Wound Assessment Details Patient Name: [name] Date of Service: 02/12/2019 10:45 AM Medical Record Number: [id number] Patient Account Number: [id number] Date of Birth/Sex: [birth year] (68 y.o. M) Treating RN: Cornell Mooney Primary Care Aleksa Collinsworth: SYSTEM, PCP Other Clinician: Referring Timia Casselman: Referral, Self Treating Moustapha Tooker/Extender: STONE III, [name] Weeks in Treatment: 13 Wound Status Wound Number: 9 Primary Venous Leg Ulcer Etiology: Wound Location: Left Lower Leg - Midline, Anterior Wound Open Wounding Event: Gradually Appeared Status: Date Acquired: 02/10/2019 Comorbid Hypertension, Peripheral Venous Disease, Type Weeks Of Treatment: 0 History: II Diabetes, Neuropathy, Received Clustered Wound: Yes Chemotherapy Photos Wound Measurements Length: (cm)  2.2 Width: (cm) 1.2 Depth: (cm) 0.1 Clustered Quantity: 2 Area: (cm) 2.073 Volume: (cm) 0.207 % Reduction in Area: 0% % Reduction in Volume: 0% Wound Description Full Thickness Without Exposed Support Classification: Structures Wound Margin: Flat and Intact Exudate Large Amount: Exudate Type: Serous Exudate Color: amber Foul Odor After Cleansing: No Slough/Fibrino No Wound Bed Granulation Amount: Large (67-100%) Exposed Structure Granulation Quality: Red Fascia Exposed: No Necrotic Amount: None Present (0%) Fat Layer (Subcutaneous Tissue) Exposed: No Tendon Exposed: No Muscle Exposed: No Joint Exposed: No Bone Exposed: No [name], [name] ([id number]) Treatment Notes Wound #9 (Left, Midline, Anterior Lower Leg) Notes hydrofera blue, abd, 3 layer wrap with unna to anchor Electronic Signature(s) Signed: 02/12/2019 5:14:16 PM By: Gretta Cool, BSN, RN, CWS, Kim RN, BSN Entered By: Gretta Cool, BSN, RN, CWS, Kim on 02/12/2019 11:01:55 [name] ([id number]) -------------------------------------------------------------------------------- [place] Details Patient Name: [name] Date of Service: 02/12/2019 10:45 AM Medical Record Number: [id number] Patient Account Number: [id number] Date of Birth/Sex: November 08, 1950 (68 y.o. M) Treating RN: Montey Hora Primary Care [name]: SYSTEM, PCP Other Clinician: Referring [name]: Referral, Self Treating Alvaro Aungst/Extender: STONE III, [name] Weeks in Treatment: 13 Vital Signs Time Taken: 10:50 Temperature (F): 98.2 Height (in): 73 Pulse (bpm): 58 Weight (lbs): 300 Respiratory Rate (breaths/min): 16 Body Mass Index (BMI): 39.6 Blood Pressure (mmHg): 146/64 Reference Range: 80 - 120 mg / dl Electronic Signature(s) Signed: 02/12/2019 4:06:56 PM By: Lorine Bears RCP, RRT, CHT Entered By: Lorine Bears on 02/12/2019 10:53:19

## 2019-02-12 NOTE — Progress Notes (Addendum)
JULYEN, NYARKO ([id number]) Visit Report for 02/12/2019 Chief Complaint Document Details Patient Name: [name], [name] Date of Service: 02/12/2019 10:45 AM Medical Record Number: [id number] Patient Account Number: [id number] Date of Birth/Sex: [birth year] (68 y.o. M) Treating RN: Montey Hora Primary Care Provider: SYSTEM, PCP Other Clinician: Referring Provider: Referral, Self Treating Provider/Extender: Melburn Hake, [name] Weeks in Treatment: 13 Information Obtained from: Patient Chief Complaint Left LE ulcers Electronic Signature(s) Signed: 02/12/2019 10:56:02 AM By: Worthy Keeler PA-C Entered By: Worthy Keeler on 02/12/2019 10:56:02 [name] ([id number]) -------------------------------------------------------------------------------- Debridement Details Patient Name: [name] Date of Service: 02/12/2019 10:45 AM Medical Record Number: [id number] Patient Account Number: [id number] Date of Birth/Sex: Oct 16, 1950 (68 y.o. M) Treating RN: Montey Hora Primary Care Provider: SYSTEM, PCP Other Clinician: Referring Provider: Referral, Self Treating Provider/Extender: STONE III, [name] Weeks in Treatment: 13 Debridement Performed for Wound #8 Left,Posterior Lower Leg Assessment: Performed By: Physician STONE III, [name] E., PA-C Debridement Type: Debridement Severity of Tissue Pre Fat layer exposed Debridement: Level of Consciousness (Pre- Awake and Alert procedure): Pre-procedure Verification/Time Yes - 11:27 Out Taken: Start Time: 11:27 Pain Control: Lidocaine 4% Topical Solution Total Area Debrided (L x W): 6 (cm) x 2.6 (cm) = 15.6 (cm) Tissue and other material Viable, Non-Viable, Skin: Dermis debrided: Level: Skin/Dermis Debridement Description: Selective/Open Wound Instrument: Forceps, Scissors Bleeding: None End Time: 11:30 Procedural Pain: 0 Post Procedural Pain: 0 Response to Treatment: Procedure was tolerated [name] Level of Consciousness Awake and  Alert (Post-procedure): Post Debridement Measurements of Total Wound Length: (cm) 12.5 Width: (cm) 8 Depth: (cm) 0.1 Volume: (cm) 7.854 Character of Wound/Ulcer Post Debridement: Improved Severity of Tissue Post Debridement: Fat layer exposed Post Procedure Diagnosis Same as Pre-procedure Electronic Signature(s) Signed: 02/12/2019 4:54:06 PM By: Montey Hora Signed: 02/12/2019 5:57:32 PM By: Worthy Keeler PA-C Entered By: Montey Hora on 02/12/2019 11:31:49 [name] ([id number]) -------------------------------------------------------------------------------- HPI Details Patient Name: [name] Date of Service: 02/12/2019 10:45 AM Medical Record Number: [id number] Patient Account Number: [id number] Date of Birth/Sex: 03/06/1951 (68 y.o. M) (68 y.o. M) Treating RN: Montey Hora Primary Care Provider: SYSTEM, PCP Other Clinician: Referring Provider: Referral, Self Treating Provider/Extender: STONE III, [name] Weeks in Treatment: 13 History of Present Illness HPI Description: 68 year old male who presented to the ER with bilateral lower extremity blisters which had started last week. he has a past medical history of leukemia, diabetes mellitus, hypertension, edema of both lower extremities, his recurrent skin infections, peripheral vascular disease, coronary artery disease, congestive heart failure and peripheral neuropathy. in the ER he was given Rocephin and put on Silvadene cream. he was put on oral doxycycline and was asked to follow-up with the [facility]. His last hemoglobin A1c was 6.6 in December and he checks his blood sugar once a week. He does not have any physicians outside the [place] system. He does not recall any vascular duplex studies done either for arterial or venous disease but was told to wear compression stockings which he does not use 05/30/2016 -- we have not yet received any of his notes from the [facility] hospital system and his arterial and venous duplex studies are  scheduled here in [place] around mid February. We are unable to have his insurance accepted by home health agencies and hence he is getting dressings only once a week. 06/06/16 -- -- I received a call from the patient's PCP at the [facility] at [facility] and spoke to Dr. Garvin Fila, phone number [phone] and fax number [phone]. She confirmed that no vascular testing was done over  the last 5 years and she would be happy to do them if the patient did want them to be done at the [place] and we could fax him a request. Readmission: 69 year old male seen by as in February of this year and was referred to vein and vascular for studies and opinion from the vascular surgeons. The patient returns today with a fresh problem having had blisters on his left lower extremity which have been there for about 5 days and he clearly states that he has been wearing his compression stockings as advised though he could not read the moderate compression and has been wearing light compression. Review of his electronic medical records note that he had lower extremity arterial duplex examination done on 06/23/2016 which showed no hemodynamically significant stenosis in the bilateral lower extremity arterial system. He also had a lower extremity venous reflux examination done on 07/07/2016 and it was noted that he had venous incompetence in the right great saphenous vein and bilateral common femoral veins. Patient was seen by Dr. Tamala Julian on the same day and for some reason his notes do not reflect the venous studies or the arterial studies and he recommended patient do a venous duplex ultrasound to look for reflux and return to see him.he would also consider a lymph pump if required. The patient was told that his workup was normal and hence the patient canceled his follow-up appointment. 02/03/17 on evaluation today patient left medial lower extremity blister appears to be doing about the same. It is still continuing to  drain and there's still the blistered skin covering the wound bed which is making it difficult for the alternate to do its job. Fortunately there is no evidence of cellulitis. No fevers chills noted. Patient states in general he is not having any significant discomfort. Patient's lower extremity arterial duplex exam revealed that patient was hemodynamically stable with no evidence of stenosis in regard to the bilateral lower extremities. The lower extremity venous reflux exam revealed the patient had venous incontinence noted in the right greater saphenous and bilateral common femoral vein. There is no evidence of deep or superficial vein thrombosis in the bilateral lower extremities. Readmission: [name], [name] ([id number]) 11/12/18 Patient presents for evaluation our clinic today concerning issues that he is having with his left lower extremity. He tells me that a couple weeks ago he began developing blisters on the left lower extremity along with increased swelling. He typically wears his compression stockings on a regular basis is previously been evaluated both here as [name] is with vascular surgery they would recommend lymphedema pumps but unfortunately that somehow fell through and he never heard anything back from that. Nonetheless I think lymphedema pumps would be beneficial for this patient. He does have a history of hypertension and diabetes. Obviously the chronic venous stasis and lymphedema as [name]. At this point the blisters have been given in more trouble he states sometimes when the blisters openings able to clean it down with alcohol and it will dry out and do [name]. Unfortunately that has not been the case this time. He is having some discomfort although this mean these with cleaning the areas he doesn't have discomfort just on a regular basis. He has not been able to wear his compression stockings since the blisters arose due to the fact that of course it will drain into the socks  causing additional issues and he didn't have any way to wrap this otherwise. He has increased to taking his Lasix every day instead  of every other day. He sees his primary care provider later this month as [name]. No fevers, chills, nausea, or vomiting noted at this time. 11/19/18-Patient returns at 1 week, per intake RN the amount of seepage into the compression wraps was definitely improved, overall all the wounds are measuring smaller but continuing silver alginate to the wounds as primary dressing 11/26/18 on evaluation today patient appears to be doing quite [name] in regard to his left lower [place] ulcers. In fact of the areas that were noted initially he only has two regions still open. There is no evidence of active infection at this time. He still is not heard anything from the company regarding lymphedema pumps as of yet. Again as previously seen vascular they have not recommended any surgical intervention. 12/03/2018 on evaluation today patient actually appears to be doing quite [name] with regard to his lower extremity ulcers. In fact most of the areas appear to be healed the one spot which does not seem to be completely healed I am unsure of whether or not this is really draining that much but nonetheless there does not appear to be any signs of infection or significant drainage at this point. There is no sign of fever, chills, nausea, vomiting, or diarrhea. Overall I am pleased with how things have progressed I think is very close to being able to transition to his home compression stockings. 12/10/2018 upon evaluation today patient appears to be doing quite [name] with regard to his left lower extremity. He has been tolerating the dressing changes without complication. Fortunately there is no signs of active infection at this time. He appears after thorough evaluation of his leg to only have 1 small area that remains open at this point everything else appears to be almost completely closed. He  still have significant swelling of the left lower extremity. We had discussed discussing this with his primary care provider he is not able to see her in person they were at the [facility] and right now the [place] is not seeing patients on site. According to the patient anyway. Subsequently he did speak with her apparently and his primary care provider feels that he may likely have a DVT. With that being said she has not seen his leg she is just going off of his history. Nonetheless that is a concern that the patient now has as [name] and while I do not feel the DVT is likely we can definitely ensure that that is not the case I will go ahead and see about putting that order in today. Nonetheless otherwise I am in a recommend that we continue with the current wound care measures including the compression therapy most likely. We just need to ensure that his leg is indeed free of any DVTs. 12/17/2018 on evaluation today patient actually appears to be completely healed today. He does have 2 very small areas of blistering although this is not anything too significant at this point which is good news. With that being said I am in agreement with the fact that I think he is completely healed at this point. He does want to get back into his compression stocking. The good news is we have gotten approval from insurance for his lymphedema pumps we received a letter since last saw him last week. The other good news is his study did come back and showed no evidence of a DVT. 12/20/2018 on evaluation today patient presents for follow-up concerning his ongoing issues with his left lower extremity. He  was actually discharged last Friday and did fairly [name] until he states blisters opened this morning. He tells me he has been wearing his compression stocking although he has a hard time getting this on. There does not appear to be any signs of active infection at this time. No fevers, chills, nausea, vomiting, or  diarrhea. 12/27/2018 on evaluation today patient appears to be doing very [name] with regard to his swelling of the left lower extremity the 4 layer compression wrap seems to have been beneficial for him. Fortunately there is no signs of active infection at this time. Patient has been tolerating the compression wrap without complication and his foot swelling in particular appears to be greatly improved. He does still have a wound on the lateral portion of his left leg I believe this is more of a blister that has now reopened. 01/03/2019 on evaluation today patient actually appears to be doing excellent in regard to his left lower extremity. He did receive his compression pumps and is actually use this 7 times since he was last here in the office. On top of the compression wrap he is now roughly 3 cm better at the calf and 2 cm better at the ankle he also states that his foot seem to go an issue better without even having to use a shoe horn. Obviously I think this is all evidence that he is doing excellent in this regard. The other good news is he does not appear to have anything open today as far as wounds are concerned. [name], [name] ([id number]) 01/15/2019 on evaluation today patient appears to be doing more poorly yet again with regard to his left lower extremity. He has developed new wounds again after being discharged just recently. Unfortunately this continues to be the case that he will heal and then have subsequent new wounds. The last time I was hopeful that he may not end up coming back too quickly especially since he states he has been using his lymphedema pumps along with wearing his compression. Nonetheless he had a blister on the back of his leg that popped up on the left and this has opened up into an ulceration it is quite painful. 01/22/19 on evaluation today patient actually appears to be doing [name] with regard to his wound on the left lower extremity. He's been tolerating the dressing  changes without complication including the compression wrap in the wound appears to be significantly smaller today which is great news. Overall very pleased in this regard. 01/29/2019 on evaluation today patient appears to be doing [name] with regard to his left posterior lower extremity ulcer. He has been tolerating the dressing changes without complication. This is not completely healed but is getting much closer. We did order a Farrow wrap 4000 for him he has received this and has it with him today although I am not sure we are quite ready to start him on that as of yet. We are very close. 02/05/2019 on evaluation today patient actually appears to be doing quite [name] with regard to his left posterior lower extremity ulcer. He still has a very tiny opening remaining but the fortunate thing is he seems to be healing quite nicely. He also did get his Farrow wrap which I am hoping will help with his edema control as [name] at home. Fortunately there is no evidence of active infection. 02/12/2019 patient and fortunately appears to be doing poorly in regard to his wounds of the left lower extremity. He was very close  to healing therefore we attempted to use his Velcro compression wraps continuing with lymphedema pumps at home. Unfortunately that does not seem to have done very [name] for him. He tells me that he wore them all the time but again I am not sure why if that is the case that he is having such significant edema. He is still on his fluid pills as [name]. With that being said there is no obvious sign of infection although I do wonder about the possibility of infection at this time as [name]. Electronic Signature(s) Signed: 02/12/2019 12:55:28 PM By: Worthy Keeler PA-C Entered By: Worthy Keeler on 02/12/2019 12:55:28 [name] ([id number]) -------------------------------------------------------------------------------- Physical Exam Details Patient Name: [name], [name] Date of Service: 02/12/2019  10:45 AM Medical Record Number: [id number] Patient Account Number: [id number] Date of Birth/Sex: 03-20-1951 (68 y.o. M) Treating RN: Montey Hora Primary Care Provider: SYSTEM, PCP Other Clinician: Referring Provider: Referral, Self Treating Provider/Extender: STONE III, [name] Weeks in Treatment: 13 Constitutional Obese and [name]-hydrated in no acute distress. Respiratory normal breathing without difficulty. clear to auscultation bilaterally. Cardiovascular regular rate and rhythm with normal S1, S2. Psychiatric this patient is able to make decisions and demonstrates good insight into disease process. Alert and Oriented x 3. pleasant and cooperative. Notes His wound bed currently showed signs of good granulation at this time as [name] as blistering unfortunately. I did remove some of the blistered tissue which was nonviable on the surface of the wound today. He tolerated this without complication. I am also can place him on an antibiotic Electronic Signature(s) Signed: 02/12/2019 12:56:03 PM By: Worthy Keeler PA-C Entered By: Worthy Keeler on 02/12/2019 12:56:03 [name] ([id number]) -------------------------------------------------------------------------------- Physician Orders Details Patient Name: [name] Date of Service: 02/12/2019 10:45 AM Medical Record Number: [id number] Patient Account Number: [id number] Date of Birth/Sex: Apr 28, 1951 (68 y.o. M) Treating RN: Montey Hora Primary Care Provider: SYSTEM, PCP Other Clinician: Referring Provider: Referral, Self Treating Provider/Extender: STONE III, [name] Weeks in Treatment: 13 Verbal / Phone Orders: No Diagnosis Coding ICD-10 Coding Code Description E11.622 Type 2 diabetes mellitus with other skin ulcer I89.0 Lymphedema, not elsewhere classified I87.2 Venous insufficiency (chronic) (peripheral) L97.822 Non-pressure chronic ulcer of other part of left lower leg with fat layer exposed I10 Essential (primary)  hypertension Wound Cleansing Wound #8 Left,Posterior Lower Leg o May shower with protection. - Please do not get your wrap wet Wound #9 Left,Midline,Anterior Lower Leg o May shower with protection. - Please do not get your wrap wet Primary Wound Dressing Wound #8 Left,Posterior Lower Leg o Hydrafera Blue Ready Transfer Wound #9 Left,Midline,Anterior Lower Leg o Hydrafera Blue Ready Transfer Secondary Dressing Wound #8 Left,Posterior Lower Leg o ABD pad Wound #9 Left,Midline,Anterior Lower Leg o ABD pad Dressing Change Frequency Wound #8 Left,Posterior Lower Leg o Change dressing every week Wound #9 Left,Midline,Anterior Lower Leg o Change dressing every week Follow-up Appointments Wound #8 Left,Posterior Lower Leg o Return Appointment in 1 week. Edema Control [place], PIERCEN ([id number]) Wound #8 Left,Posterior Lower Leg o 3 Layer Compression System - Left Lower Extremity Wound #9 Left,Midline,Anterior Lower Leg o 3 Layer Compression System - Left Lower Extremity Patient Medications Allergies: No Known Drug Allergies Notifications Medication Indication Start End doxycycline hyclate 02/12/2019 DOSE 1 - oral 100 mg capsule - 1 capsule oral taken 2 times a day for 10 days Electronic Signature(s) Signed: 02/12/2019 11:37:32 AM By: Worthy Keeler PA-C Entered By: Worthy Keeler on 02/12/2019 11:37:31 [name] ([id number]) -------------------------------------------------------------------------------- Problem List Details  Patient Name: [name], [name] Date of Service: 02/12/2019 10:45 AM Medical Record Number: [id number] Patient Account Number: [id number] Date of Birth/Sex: 06/21/1950 (68 y.o. M) Treating RN: Montey Hora Primary Care Provider: SYSTEM, PCP Other Clinician: Referring Provider: Referral, Self Treating Provider/Extender: Melburn Hake, [name] Weeks in Treatment: 13 Active Problems ICD-10 Evaluated Encounter Code Description Active  Date Today Diagnosis E11.622 Type 2 diabetes mellitus with other skin ulcer 11/12/2018 No Yes I89.0 Lymphedema, not elsewhere classified 11/12/2018 No Yes I87.2 Venous insufficiency (chronic) (peripheral) 11/12/2018 No Yes L97.822 Non-pressure chronic ulcer of other part of left lower leg with 11/12/2018 No Yes fat layer exposed [place] (primary) hypertension 11/12/2018 No Yes Inactive Problems Resolved Problems Electronic Signature(s) Signed: 02/12/2019 10:55:56 AM By: Worthy Keeler PA-C Entered By: Worthy Keeler on 02/12/2019 10:55:56 [name] ([id number]) -------------------------------------------------------------------------------- Progress Note Details Patient Name: [name] Date of Service: 02/12/2019 10:45 AM Medical Record Number: [id number] Patient Account Number: [id number] Date of Birth/Sex: 04-09-1951 (68 y.o. M) Treating RN: Montey Hora Primary Care Provider: SYSTEM, PCP Other Clinician: Referring Provider: Referral, Self Treating Provider/Extender: STONE III, [name] Weeks in Treatment: 13 Subjective Chief Complaint Information obtained from Patient Left LE ulcers History of Present Illness (HPI) 68 year old male who presented to the ER with bilateral lower extremity blisters which had started last week. he has a past medical history of leukemia, diabetes mellitus, hypertension, edema of both lower extremities, his recurrent skin infections, peripheral vascular disease, coronary artery disease, congestive heart failure and peripheral neuropathy. in the ER he was given Rocephin and put on Silvadene cream. he was put on oral doxycycline and was asked to follow-up with the [facility]. His last hemoglobin A1c was 6.6 in December and he checks his blood sugar once a week. He does not have any physicians outside the [place] system. He does not recall any vascular duplex studies done either for arterial or venous disease but was told to wear compression stockings which he  does not use 05/30/2016 -- we have not yet received any of his notes from the [facility] hospital system and his arterial and venous duplex studies are scheduled here in [place] around mid February. We are unable to have his insurance accepted by home health agencies and hence he is getting dressings only once a week. 06/06/16 -- -- I received a call from the patient's PCP at the [facility] at [facility] and spoke to Dr. Garvin Fila, phone number [phone] and fax number [phone]. She confirmed that no vascular testing was done over the last 5 years and she would be happy to do them if the patient did want them to be done at the [place] and we could fax him a request. Readmission: 68 year old male seen by as in February of this year and was referred to vein and vascular for studies and opinion from the vascular surgeons. The patient returns today with a fresh problem having had blisters on his left lower extremity which have been there for about 5 days and he clearly states that he has been wearing his compression stockings as advised though he could not read the moderate compression and has been wearing light compression. Review of his electronic medical records note that he had lower extremity arterial duplex examination done on 06/23/2016 which showed no hemodynamically significant stenosis in the bilateral lower extremity arterial system. He also had a lower extremity venous reflux examination done on 07/07/2016 and it was noted that he had venous incompetence in the right great saphenous vein and bilateral common  femoral veins. Patient was seen by Dr. Tamala Julian on the same day and for some reason his notes do not reflect the venous studies or the arterial studies and he recommended patient do a venous duplex ultrasound to look for reflux and return to see him.he would also consider a lymph pump if required. The patient was told that his workup was normal and hence the patient canceled his follow-up  appointment. 02/03/17 on evaluation today patient left medial lower extremity blister appears to be doing about the same. It is still continuing to drain and there's still the blistered skin covering the wound bed which is making it difficult for the alternate to do its job. Fortunately there is no evidence of cellulitis. No fevers chills noted. Patient states in general he is not having any significant discomfort. Patient's lower extremity arterial duplex exam revealed that patient was hemodynamically stable with no evidence of stenosis in regard to the bilateral lower extremities. The lower extremity venous reflux exam revealed the patient had venous incontinence noted in the right greater saphenous [place], [name] ([id number]) and bilateral common femoral vein. There is no evidence of deep or superficial vein thrombosis in the bilateral lower extremities. Readmission: 11/12/18 Patient presents for evaluation our clinic today concerning issues that he is having with his left lower extremity. He tells me that a couple weeks ago he began developing blisters on the left lower extremity along with increased swelling. He typically wears his compression stockings on a regular basis is previously been evaluated both here as [name] is with vascular surgery they would recommend lymphedema pumps but unfortunately that somehow fell through and he never heard anything back from that. Nonetheless I think lymphedema pumps would be beneficial for this patient. He does have a history of hypertension and diabetes. Obviously the chronic venous stasis and lymphedema as [name]. At this point the blisters have been given in more trouble he states sometimes when the blisters openings able to clean it down with alcohol and it will dry out and do [name]. Unfortunately that has not been the case this time. He is having some discomfort although this mean these with cleaning the areas he doesn't have discomfort just on a regular  basis. He has not been able to wear his compression stockings since the blisters arose due to the fact that of course it will drain into the socks causing additional issues and he didn't have any way to wrap this otherwise. He has increased to taking his Lasix every day instead of every other day. He sees his primary care provider later this month as [name]. No fevers, chills, nausea, or vomiting noted at this time. 11/19/18-Patient returns at 1 week, per intake RN the amount of seepage into the compression wraps was definitely improved, overall all the wounds are measuring smaller but continuing silver alginate to the wounds as primary dressing 11/26/18 on evaluation today patient appears to be doing quite [name] in regard to his left lower [place] ulcers. In fact of the areas that were noted initially he only has two regions still open. There is no evidence of active infection at this time. He still is not heard anything from the company regarding lymphedema pumps as of yet. Again as previously seen vascular they have not recommended any surgical intervention. 12/03/2018 on evaluation today patient actually appears to be doing quite [name] with regard to his lower extremity ulcers. In fact most of the areas appear to be healed the one spot which does not seem to  be completely healed I am unsure of whether or not this is really draining that much but nonetheless there does not appear to be any signs of infection or significant drainage at this point. There is no sign of fever, chills, nausea, vomiting, or diarrhea. Overall I am pleased with how things have progressed I think is very close to being able to transition to his home compression stockings. 12/10/2018 upon evaluation today patient appears to be doing quite [name] with regard to his left lower extremity. He has been tolerating the dressing changes without complication. Fortunately there is no signs of active infection at this time. He appears after  thorough evaluation of his leg to only have 1 small area that remains open at this point everything else appears to be almost completely closed. He still have significant swelling of the left lower extremity. We had discussed discussing this with his primary care provider he is not able to see her in person they were at the [facility] and right now the [place] is not seeing patients on site. According to the patient anyway. Subsequently he did speak with her apparently and his primary care provider feels that he may likely have a DVT. With that being said she has not seen his leg she is just going off of his history. Nonetheless that is a concern that the patient now has as [name] and while I do not feel the DVT is likely we can definitely ensure that that is not the case I will go ahead and see about putting that order in today. Nonetheless otherwise I am in a recommend that we continue with the current wound care measures including the compression therapy most likely. We just need to ensure that his leg is indeed free of any DVTs. 12/17/2018 on evaluation today patient actually appears to be completely healed today. He does have 2 very small areas of blistering although this is not anything too significant at this point which is good news. With that being said I am in agreement with the fact that I think he is completely healed at this point. He does want to get back into his compression stocking. The good news is we have gotten approval from insurance for his lymphedema pumps we received a letter since last saw him last week. The other good news is his study did come back and showed no evidence of a DVT. 12/20/2018 on evaluation today patient presents for follow-up concerning his ongoing issues with his left lower extremity. He was actually discharged last Friday and did fairly [name] until he states blisters opened this morning. He tells me he has been wearing his compression stocking although he has a  hard time getting this on. There does not appear to be any signs of active infection at this time. No fevers, chills, nausea, vomiting, or diarrhea. 12/27/2018 on evaluation today patient appears to be doing very [name] with regard to his swelling of the left lower extremity the 4 layer compression wrap seems to have been beneficial for him. Fortunately there is no signs of active infection at this time. Patient has been tolerating the compression wrap without complication and his foot swelling in particular appears to be greatly improved. He does still have a wound on the lateral portion of his left leg I believe this is more of a blister that has now reopened. [name], [name] ([id number]) 01/03/2019 on evaluation today patient actually appears to be doing excellent in regard to his left lower extremity. He did  receive his compression pumps and is actually use this 7 times since he was last here in the office. On top of the compression wrap he is now roughly 3 cm better at the calf and 2 cm better at the ankle he also states that his foot seem to go an issue better without even having to use a shoe horn. Obviously I think this is all evidence that he is doing excellent in this regard. The other good news is he does not appear to have anything open today as far as wounds are concerned. 01/15/2019 on evaluation today patient appears to be doing more poorly yet again with regard to his left lower extremity. He has developed new wounds again after being discharged just recently. Unfortunately this continues to be the case that he will heal and then have subsequent new wounds. The last time I was hopeful that he may not end up coming back too quickly especially since he states he has been using his lymphedema pumps along with wearing his compression. Nonetheless he had a blister on the back of his leg that popped up on the left and this has opened up into an ulceration it is quite painful. 01/22/19 on evaluation  today patient actually appears to be doing [name] with regard to his wound on the left lower extremity. He's been tolerating the dressing changes without complication including the compression wrap in the wound appears to be significantly smaller today which is great news. Overall very pleased in this regard. 01/29/2019 on evaluation today patient appears to be doing [name] with regard to his left posterior lower extremity ulcer. He has been tolerating the dressing changes without complication. This is not completely healed but is getting much closer. We did order a Farrow wrap 4000 for him he has received this and has it with him today although I am not sure we are quite ready to start him on that as of yet. We are very close. 02/05/2019 on evaluation today patient actually appears to be doing quite [name] with regard to his left posterior lower extremity ulcer. He still has a very tiny opening remaining but the fortunate thing is he seems to be healing quite nicely. He also did get his Farrow wrap which I am hoping will help with his edema control as [name] at home. Fortunately there is no evidence of active infection. 02/12/2019 patient and fortunately appears to be doing poorly in regard to his wounds of the left lower extremity. He was very close to healing therefore we attempted to use his Velcro compression wraps continuing with lymphedema pumps at home. Unfortunately that does not seem to have done very [name] for him. He tells me that he wore them all the time but again I am not sure why if that is the case that he is having such significant edema. He is still on his fluid pills as [name]. With that being said there is no obvious sign of infection although I do wonder about the possibility of infection at this time as [name]. Patient History Information obtained from Patient. Family History Cancer - Mother, Stroke - Father, No family history of Diabetes, Heart Disease, Hereditary Spherocytosis,  Hypertension, Kidney Disease, Lung Disease, Seizures, Thyroid Problems, Tuberculosis. Social History Former smoker - quit 5 years ago, Marital Status - Divorced, Alcohol Use - Never, Drug Use - No History, Caffeine Use - Daily. Medical History Cardiovascular Patient has history of Hypertension, Peripheral Venous Disease Denies history of Angina, Arrhythmia, Congestive Heart Failure,  Coronary Artery Disease, Deep Vein Thrombosis, Hypotension, Myocardial Infarction, Peripheral Arterial Disease, Phlebitis, Vasculitis Endocrine Patient has history of Type II Diabetes Integumentary (Skin) Denies history of History of Burn, History of pressure wounds Neurologic Patient has history of Neuropathy Denies history of Dementia, Quadriplegia, Paraplegia, Seizure Disorder Oncologic Patient has history of Received Chemotherapy Denies history of Received Radiation [name], [name] ([id number]) Medical And Surgical History Notes 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 Respiratory Denies complaints or symptoms of Chronic or frequent coughs, Shortness of Breath. Cardiovascular Denies complaints or symptoms of Chest pain, LE edema. Psychiatric Denies complaints or symptoms of Anxiety, Claustrophobia. Objective Constitutional Obese and [name]-hydrated in no acute distress. Vitals Time Taken: 10:50 AM, Height: 73 in, Weight: 300 lbs, BMI: 39.6, Temperature: 98.2 F, Pulse: 58 bpm, Respiratory Rate: 16 breaths/min, Blood Pressure: 146/64 mmHg. Respiratory normal breathing without difficulty. clear to auscultation bilaterally. Cardiovascular regular rate and rhythm with normal S1, S2. Psychiatric this patient is able to make decisions and demonstrates good insight into disease process. Alert and Oriented x 3. pleasant and cooperative. General Notes: His  wound bed currently showed signs of good granulation at this time as [name] as blistering unfortunately. I did remove some of the blistered tissue which was nonviable on the surface of the wound today. He tolerated this without complication. I am also can place him on an antibiotic Integumentary (Hair, Skin) Wound #8 status is Open. Original cause of wound was Blister. The wound is located on the Left,Posterior Lower Leg. The wound measures 6cm length x 2.6cm width x 0.1cm depth; 12.252cm^2 area and 1.225cm^3 volume. There is Fat Layer (Subcutaneous Tissue) Exposed exposed. There is a medium amount of serosanguineous drainage noted. The wound margin is flat and intact. There is large (67-100%) red granulation within the wound bed. There is a small (1-33%) amount of necrotic tissue within the wound bed including Eschar and Adherent Slough. Wound #9 status is Open. Original cause of wound was Gradually Appeared. The wound is located on the Left,Midline,Anterior Lower Leg. The wound measures 2.2cm length x 1.2cm width x 0.1cm depth; 2.073cm^2 area and 0.207cm^3 volume. There is a large amount of serous drainage noted. The wound margin is flat and intact. There is large (67- 100%) red granulation within the wound bed. There is no necrotic tissue within the wound bed. [name], [name] ([id number]) Assessment Active Problems ICD-10 Type 2 diabetes mellitus with other skin ulcer Lymphedema, not elsewhere classified Venous insufficiency (chronic) (peripheral) Non-pressure chronic ulcer of other part of left lower leg with fat layer exposed Essential (primary) hypertension Procedures Wound #8 Pre-procedure diagnosis of Wound #8 is a Diabetic Wound/Ulcer of the Lower Extremity located on the Left,Posterior Lower Leg .Severity of Tissue Pre Debridement is: Fat layer exposed. There was a Selective/Open Wound Skin/Dermis Debridement with a total area of 15.6 sq cm performed by STONE III, [name] E., PA-C. With  the following instrument(s): Forceps, and Scissors to remove Viable and Non-Viable tissue/material. Material removed includes Skin: Dermis after achieving pain control using Lidocaine 4% Topical Solution. No specimens were taken. A time out was conducted at 11:27, prior to the start of the procedure. There was no bleeding. The procedure was tolerated [name] with a pain level of 0 throughout and a pain level of 0 following the procedure. Post Debridement Measurements: 12.5cm length x 8cm width x 0.1cm depth; 7.854cm^3 volume. Character of Wound/Ulcer Post Debridement  is improved. Severity of Tissue Post Debridement is: Fat layer exposed. Post procedure Diagnosis Wound #8: Same as Pre-Procedure Plan Wound Cleansing: Wound #8 Left,Posterior Lower Leg: May shower with protection. - Please do not get your wrap wet Wound #9 Left,Midline,Anterior Lower Leg: May shower with protection. - Please do not get your wrap wet Primary Wound Dressing: Wound #8 Left,Posterior Lower Leg: Hydrafera Blue Ready Transfer Wound #9 Left,Midline,Anterior Lower Leg: Hydrafera Blue Ready Transfer Secondary Dressing: Wound #8 Left,Posterior Lower Leg: ABD pad Wound #9 Left,Midline,Anterior Lower Leg: ABD pad Dressing Change Frequency: Wound #8 Left,Posterior Lower Leg: Change dressing every week Wound #9 Left,Midline,Anterior Lower Leg: [name], [name] ([id number]) Change dressing every week Follow-up Appointments: Wound #8 Left,Posterior Lower Leg: Return Appointment in 1 week. Edema Control: Wound #8 Left,Posterior Lower Leg: 3 Layer Compression System - Left Lower Extremity Wound #9 Left,Midline,Anterior Lower Leg: 3 Layer Compression System - Left Lower Extremity The following medication(s) was prescribed: doxycycline hyclate oral 100 mg capsule 1 1 capsule oral taken 2 times a day for 10 days starting 02/12/2019 1. I am going to suggest currently that we go ahead and initiate treatment with doxycycline as  I feel like this will be a good option for him from the standpoint of an antibiotic. 2. Subsequently I am to go ahead and reinitiate the compression wrap here in the office as [name]. That seems to have done much better for him when we actually were wrapping his leg. Again we were utilizing a 3 layer compression. 3. I am also going to suggest that we continue with the [facility] dressing which was also doing [name] at that point. 4. I am going to look into making referral to lymphedema clinic once we get everything healed although obviously we need to get this healed first as they do not see patients with open and active wounds. We will see patient back for reevaluation in 1 week here in the clinic. If anything worsens or changes patient will contact our office for additional recommendations. Electronic Signature(s) Signed: 02/12/2019 12:56:46 PM By: Worthy Keeler PA-C Entered By: Worthy Keeler on 02/12/2019 12:56:46 [name] ([id number]) -------------------------------------------------------------------------------- ROS/PFSH Details Patient Name: [name] Date of Service: 02/12/2019 10:45 AM Medical Record Number: [id number] Patient Account Number: [id number] Date of Birth/Sex: 30-Jan-1951 (68 y.o. M) Treating RN: Montey Hora Primary Care Provider: SYSTEM, PCP Other Clinician: Referring Provider: Referral, Self Treating Provider/Extender: STONE III, [name] Weeks in Treatment: 13 Information Obtained From Patient Constitutional Symptoms ([place]) Complaints and Symptoms: Negative for: Fatigue; Fever; Chills; Marked Weight Change Respiratory Complaints and Symptoms: Negative for: Chronic or frequent coughs; Shortness of Breath Cardiovascular Complaints and Symptoms: Negative for: Chest pain; LE edema Medical History: Positive for: Hypertension; Peripheral Venous Disease Negative for: Angina; Arrhythmia; Congestive Heart Failure; Coronary Artery Disease; Deep Vein  Thrombosis; Hypotension; Myocardial Infarction; Peripheral Arterial Disease; Phlebitis; Vasculitis Psychiatric Complaints and Symptoms: Negative for: Anxiety; Claustrophobia Endocrine Medical History: Positive for: Type II Diabetes Treated with: Oral agents Integumentary (Skin) Medical History: Negative for: History of Burn; History of pressure wounds Neurologic Medical History: Positive for: Neuropathy Negative for: Dementia; Quadriplegia; Paraplegia; Seizure Disorder Oncologic Medical History: Positive for: Received Chemotherapy Negative for: Received Radiation [name], [name] ([id number]) Past Medical History Notes: 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 Immunizations Pneumococcal Vaccine: Received Pneumococcal Vaccination: Yes Immunization Notes: up to date Implantable Devices None Family and Social History Cancer: Yes - Mother; Diabetes: No; Heart Disease: No; Hereditary Spherocytosis: No; Hypertension: No; Kidney Disease: No; Lung Disease:  No; Seizures: No; Stroke: Yes - Father; Thyroid Problems: No; Tuberculosis: No; Former smoker - quit 5 years ago; Marital Status - Divorced; Alcohol Use: Never; Drug Use: No History; Caffeine Use: Daily; Financial Concerns: No; Food, Clothing or Shelter Needs: No; Support System Lacking: No; Transportation Concerns: No Physician Affirmation I have reviewed and agree with the above information. Electronic Signature(s) Signed: 02/12/2019 4:54:06 PM By: Montey Hora Signed: 02/12/2019 5:57:32 PM By: Worthy Keeler PA-C Entered By: Worthy Keeler on 02/12/2019 12:55:41 [name] ([id number]) -------------------------------------------------------------------------------- SuperBill Details Patient Name: [name] Date of Service: 02/12/2019 Medical Record Number: [id number] Patient Account Number: [id number] Date of Birth/Sex: 1950-12-14 (68 y.o. M) Treating RN: Montey Hora Primary Care Provider: SYSTEM, PCP Other  Clinician: Referring Provider: Referral, Self Treating Provider/Extender: STONE III, [name] Weeks in Treatment: 13 Diagnosis Coding ICD-10 Codes Code Description E11.622 Type 2 diabetes mellitus with other skin ulcer I89.0 Lymphedema, not elsewhere classified I87.2 Venous insufficiency (chronic) (peripheral) L97.822 Non-pressure chronic ulcer of other part of left lower leg with fat layer exposed I10 Essential (primary) hypertension Facility Procedures CPT4 Code Description: [id number] - DEBRIDE WOUND 1ST 20 SQ CM OR < ICD-10 Diagnosis Description L97.822 Non-pressure chronic ulcer of other part of left lower leg with Modifier: fat layer expos Quantity: 1 ed Physician Procedures CPT4 Code Description: [id number] - WC PHYS LEVEL 4 - EST PT ICD-10 Diagnosis Description E11.622 Type 2 diabetes mellitus with other skin ulcer I89.0 Lymphedema, not elsewhere classified I87.2 Venous insufficiency (chronic) (peripheral) L97.822  Non-pressure chronic ulcer of other part of left lower leg with Modifier: 25 fat layer expos Quantity: 1 ed CPT4 Code Description: [id number] - WC PHYS DEBR WO ANESTH 20 SQ CM ICD-10 Diagnosis Description L97.822 Non-pressure chronic ulcer of other part of left lower leg with Modifier: fat layer expos Quantity: 1 ed Electronic Signature(s) Signed: 02/12/2019 12:57:17 PM By: Worthy Keeler PA-C Entered By: Worthy Keeler on 02/12/2019 12:57:15

## 2019-02-14 ENCOUNTER — Other Ambulatory Visit: Payer: Self-pay

## 2019-02-14 DIAGNOSIS — E11622 Type 2 diabetes mellitus with other skin ulcer: Secondary | ICD-10-CM | POA: Diagnosis not present

## 2019-02-14 NOTE — Progress Notes (Signed)
[name] ([id number]) Visit Report for 02/14/2019 Arrival Information Details Patient Name: [name], [name] Date of Service: 02/14/2019 8:00 AM Medical Record Number: [id number] Patient Account Number: [id number] Date of Birth/Sex: [birth year] (68 y.o. M) Treating RN: Montey Hora Primary Care Alexarae Oliva: SYSTEM, PCP Other Clinician: Referring Chanceler Pullin: Referral, Self Treating Cesiah Westley/Extender: STONE III, [name] Weeks in Treatment: 13 Visit Information History Since Last Visit Added or deleted any medications: No Patient Arrived: Ambulatory Any new allergies or adverse reactions: No Arrival Time: 08:02 Had a fall or experienced change in No Accompanied By: self activities of daily living that may affect Transfer Assistance: None risk of falls: Patient Identification Verified: Yes Signs or symptoms of abuse/neglect since last visito No Secondary Verification Process Completed: Yes Hospitalized since last visit: No Patient Has Alerts: Yes Implantable device outside of the clinic excluding No Patient Alerts: DMII cellular tissue based products placed in the center since last visit: Has Dressing in Place as Prescribed: Yes Has Compression in Place as Prescribed: Yes Pain Present Now: No Electronic Signature(s) Signed: 02/14/2019 1:28:19 PM By: Montey Hora Entered By: Montey Hora on 02/14/2019 13:28:19 [name] ([id number]) -------------------------------------------------------------------------------- Compression Therapy Details Patient Name: [name] Date of Service: 02/14/2019 8:00 AM Medical Record Number: [id number] Patient Account Number: [id number] Date of Birth/Sex: February 02, 1951 (68 y.o. M) Treating RN: Montey Hora Primary Care Asher Babilonia: SYSTEM, PCP Other Clinician: Referring Caelan Branden: Referral, Self Treating Dewane Timson/Extender: STONE III, [name] Weeks in Treatment: 13 Compression Therapy Performed for Wound Assessment: Wound #8 Left,Posterior Lower  Leg Performed By: Clinician Montey Hora, RN Compression Type: Three Layer Pre Treatment ABI: 1.5 Electronic Signature(s) Signed: 02/14/2019 1:30:06 PM By: Montey Hora Entered By: Montey Hora on 02/14/2019 13:30:05 [name] ([id number]) -------------------------------------------------------------------------------- Compression Therapy Details Patient Name: [name] Date of Service: 02/14/2019 8:00 AM Medical Record Number: [id number] Patient Account Number: [id number] Date of Birth/Sex: [birth year] (68 y.o. M) Treating RN: Montey Hora Primary Care Berlinda Farve: SYSTEM, PCP Other Clinician: Referring Khalifa Knecht: Referral, Self Treating Kamyiah Colantonio/Extender: STONE III, [name] Weeks in Treatment: 13 Compression Therapy Performed for Wound Assessment: Wound #9 Left,Midline,Anterior Lower Leg Performed By: Clinician Montey Hora, RN Compression Type: Three Layer Pre Treatment ABI: 1.5 Electronic Signature(s) Signed: 02/14/2019 1:30:07 PM By: Montey Hora Entered By: Montey Hora on 02/14/2019 13:30:06 [name] ([id number]) -------------------------------------------------------------------------------- Encounter Discharge Information Details Patient Name: [name] Date of Service: 02/14/2019 8:00 AM Medical Record Number: [id number] Patient Account Number: [id number] Date of Birth/Sex: Sep 09, 1950 (68 y.o. M) Treating RN: Montey Hora Primary Care Nyeemah Jennette: SYSTEM, PCP Other Clinician: Referring Jaylia Pettus: Referral, Self Treating Karstyn Birkey/Extender: STONE III, [name] Weeks in Treatment: 13 Encounter Discharge Information Items Discharge Condition: Stable Ambulatory Status: Ambulatory Discharge Destination: Home Transportation: Private Auto Accompanied By: self Schedule Follow-up Appointment: No Clinical Summary of Care: Electronic Signature(s) Signed: 02/14/2019 1:30:55 PM By: Montey Hora Entered By: Montey Hora on 02/14/2019 13:30:54 [name]  ([id number]) -------------------------------------------------------------------------------- Wound Assessment Details Patient Name: [name] Date of Service: 02/14/2019 8:00 AM Medical Record Number: [id number] Patient Account Number: [id number] Date of Birth/Sex: 03-09-1951 (68 y.o. M) Treating RN: Montey Hora Primary Care Abad Manard: SYSTEM, PCP Other Clinician: Referring Elesa Garman: Referral, Self Treating Jerric Oyen/Extender: STONE III, [name] Weeks in Treatment: 13 Wound Status Wound Number: 8 Primary Diabetic Wound/Ulcer of the Lower Extremity Etiology: Wound Location: Left Lower Leg - Posterior Wound Open Wounding Event: Blister Status: Date Acquired: 01/01/2019 Comorbid Hypertension, Peripheral Venous Disease, Weeks Of Treatment: 4 History: Type II Diabetes, Neuropathy, Received Clustered Wound: No Chemotherapy Wound Measurements Length: (cm) 6 Width: (cm) 2.6 Depth: (  cm) 0.1 Area: (cm) 12.252 Volume: (cm) 1.225 % Reduction in Area: -358.9% % Reduction in Volume: -358.8% Epithelialization: None Tunneling: No Undermining: No Wound Description Classification: Grade 1 Wound Margin: Flat and Intact Exudate Amount: Large Exudate Type: Serous Exudate Color: amber Foul Odor After Cleansing: No Slough/Fibrino Yes Wound Bed Granulation Amount: Large (67-100%) Exposed Structure Granulation Quality: Red Fascia Exposed: No Necrotic Amount: Small (1-33%) Fat Layer (Subcutaneous Tissue) Exposed: Yes Necrotic Quality: Eschar, Adherent Slough Tendon Exposed: No Muscle Exposed: No Joint Exposed: No Bone Exposed: No Treatment Notes Wound #8 (Left, Posterior Lower Leg) Notes silvercel, xtrasorb, 3 layer wrap with unna to anchor Electronic Signature(s) Signed: 02/14/2019 1:29:17 PM By: Montey Hora Previous Signature: 02/14/2019 1:29:04 PM Version By: Montey Hora Entered By: Montey Hora on 02/14/2019 13:29:16 [name]  ([id number]) -------------------------------------------------------------------------------- Wound Assessment Details Patient Name: [name] Date of Service: 02/14/2019 8:00 AM Medical Record Number: [id number] Patient Account Number: [id number] Date of Birth/Sex: 1951-01-21 (68 y.o. M) Treating RN: Montey Hora Primary Care Christopherjohn Schiele: SYSTEM, PCP Other Clinician: Referring Magdelyn Roebuck: Referral, Self Treating Arael Piccione/Extender: STONE III, [name] Weeks in Treatment: 13 Wound Status Wound Number: 9 Primary Venous Leg Ulcer Etiology: Wound Location: Left Lower Leg - Midline, Anterior Wound Open Wounding Event: Gradually Appeared Status: Date Acquired: 02/10/2019 Comorbid Hypertension, Peripheral Venous Disease, Weeks Of Treatment: 0 History: Type II Diabetes, Neuropathy, Received Clustered Wound: Yes Chemotherapy Wound Measurements Length: (cm) 2.2 % R Width: (cm) 1.2 % R Depth: (cm) 0.1 Epi Clustered Quantity: 2 Tun Area: (cm) 2.073 Un Volume: (cm) 0.207 eduction in Area: 0% eduction in Volume: 0% thelialization: None neling: No dermining: No Wound Description Full Thickness Without Exposed Support Classification: Structures Wound Margin: Flat and Intact Exudate Medium Amount: Exudate Type: Serous Exudate Color: amber Foul Odor After Cleansing: No Slough/Fibrino No Wound Bed Granulation Amount: Large (67-100%) Exposed Structure Granulation Quality: Red Fascia Exposed: No Necrotic Amount: None Present (0%) Fat Layer (Subcutaneous Tissue) Exposed: Yes Tendon Exposed: No Muscle Exposed: No Joint Exposed: No Bone Exposed: No Treatment Notes Wound #9 (Left, Midline, Anterior Lower Leg) Notes silvercel, xtrasorb, 3 layer wrap with unna to anchor Electronic Signature(s) Signed: 02/14/2019 1:29:43 PM By: Montey Hora Entered By: Montey Hora on 02/14/2019 13:29:43

## 2019-02-19 ENCOUNTER — Encounter: Payer: PPO | Admitting: Physician Assistant

## 2019-02-19 ENCOUNTER — Other Ambulatory Visit: Payer: Self-pay

## 2019-02-19 DIAGNOSIS — L97222 Non-pressure chronic ulcer of left calf with fat layer exposed: Secondary | ICD-10-CM | POA: Diagnosis not present

## 2019-02-19 DIAGNOSIS — L97822 Non-pressure chronic ulcer of other part of left lower leg with fat layer exposed: Secondary | ICD-10-CM | POA: Diagnosis not present

## 2019-02-19 DIAGNOSIS — E11622 Type 2 diabetes mellitus with other skin ulcer: Secondary | ICD-10-CM | POA: Diagnosis not present

## 2019-02-19 DIAGNOSIS — I872 Venous insufficiency (chronic) (peripheral): Secondary | ICD-10-CM | POA: Diagnosis not present

## 2019-02-20 ENCOUNTER — Other Ambulatory Visit
Admission: RE | Admit: 2019-02-20 | Discharge: 2019-02-20 | Disposition: A | Discharge status: Home / Self Care | Payer: PPO | Source: Ambulatory Visit | Attending: Physician Assistant | Admitting: Physician Assistant

## 2019-02-20 DIAGNOSIS — B999 Unspecified infectious disease: Secondary | ICD-10-CM | POA: Insufficient documentation

## 2019-02-20 NOTE — Progress Notes (Signed)
[name], Mooney ([id number]) Visit Report for 02/19/2019 Arrival Information Details Patient Name: [name], [name] Date of Service: 02/19/2019 3:15 PM Medical Record Number: [id number] Patient Account Number: [id number] Date of Birth/Sex: Feb 15, 1951 (68 y.o. M) Treating RN: Montey Hora Primary Care Saralynn Langhorst: SYSTEM, PCP Other Clinician: Referring Elyna Pangilinan: Referral, Self Treating Oleg Oleson/Extender: STONE III, [name] Weeks in Treatment: 14 Visit Information History Since Last Visit Added or deleted any medications: No Patient Arrived: Ambulatory Any new allergies or adverse reactions: No Arrival Time: 15:30 Had a fall or experienced change in No Accompanied By: self activities of daily living that may affect Transfer Assistance: None risk of falls: Patient Identification Verified: Yes Signs or symptoms of abuse/neglect since last visito No Secondary Verification Process Completed: Yes Hospitalized since last visit: No Patient Has Alerts: Yes Implantable device outside of the clinic excluding No Patient Alerts: DMII cellular tissue based products placed in the center since last visit: Has Dressing in Place as Prescribed: Yes Has Compression in Place as Prescribed: Yes Pain Present Now: No Electronic Signature(s) Signed: 02/19/2019 4:59:02 PM By: Lorine Bears RCP, RRT, CHT Entered By: Lorine Bears on 02/19/2019 15:33:13 [name] ([id number]) -------------------------------------------------------------------------------- Compression Therapy Details Patient Name: [name] Date of Service: 02/19/2019 3:15 PM Medical Record Number: [id number] Patient Account Number: [id number] Date of Birth/Sex: 1951/03/31 (68 y.o. M) Treating RN: Montey Hora Primary Care Toshiyuki Fredell: SYSTEM, PCP Other Clinician: Referring Lukka Black: Referral, Self Treating Darcey Cardy/Extender: STONE III, [name] Weeks in Treatment: 14 Compression Therapy Performed for Wound  Assessment: Wound #8 Left,Posterior Lower Leg Performed By: Clinician Montey Hora, RN Compression Type: Three Layer Pre Treatment ABI: 1.5 Post Procedure Diagnosis Same as Pre-procedure Electronic Signature(s) Signed: 02/19/2019 5:32:28 PM By: Montey Hora Entered By: Montey Hora on 02/19/2019 16:24:33 [name] ([id number]) -------------------------------------------------------------------------------- Compression Therapy Details Patient Name: [name] Date of Service: 02/19/2019 3:15 PM Medical Record Number: [id number] Patient Account Number: [id number] Date of Birth/Sex: 11/27/1950 (68 y.o. M) Treating RN: Montey Hora Primary Care Rana Hochstein: SYSTEM, PCP Other Clinician: Referring Devra Stare: Referral, Self Treating Dasie Chancellor/Extender: STONE III, [name] Weeks in Treatment: 14 Compression Therapy Performed for Wound Assessment: Wound #9 Left,Midline,Anterior Lower Leg Performed By: Clinician Montey Hora, RN Compression Type: Three Layer Pre Treatment ABI: 1.5 Post Procedure Diagnosis Same as Pre-procedure Electronic Signature(s) Signed: 02/19/2019 5:32:28 PM By: Montey Hora Entered By: Montey Hora on 02/19/2019 16:24:33 [name] ([id number]) -------------------------------------------------------------------------------- Encounter Discharge Information Details Patient Name: [name] Date of Service: 02/19/2019 3:15 PM Medical Record Number: [id number] Patient Account Number: [id number] Date of Birth/Sex: Aug 18, 1950 (68 y.o. M) Treating RN: Montey Hora Primary Care Fantasha Daniele: SYSTEM, PCP Other Clinician: Referring Fredna Stricker: Referral, Self Treating Omarii Scalzo/Extender: STONE III, [name] Weeks in Treatment: 14 Encounter Discharge Information Items Discharge Condition: Stable Ambulatory Status: Ambulatory Discharge Destination: Home Transportation: Private Auto Accompanied By: self Schedule Follow-up Appointment: Yes Clinical Summary of  Care: Electronic Signature(s) Signed: 02/19/2019 5:32:28 PM By: Montey Hora Entered By: Montey Hora on 02/19/2019 16:36:08 [name] ([id number]) -------------------------------------------------------------------------------- Lower Extremity Assessment Details Patient Name: [name] Date of Service: 02/19/2019 3:15 PM Medical Record Number: [id number] Patient Account Number: [id number] Date of Birth/Sex: February 06, 1951 (68 y.o. M) Treating RN: Harold Barban Primary Care Eydan Chianese: SYSTEM, PCP Other Clinician: Referring Lavarr President: Referral, Self Treating Curlie Sittner/Extender: STONE III, [name] Weeks in Treatment: 14 Edema Assessment Assessed: [Left: No] [Right: No] [Left: Edema] [Right: :] Calf Left: Right: Point of Measurement: 35 cm From Medial Instep 41 cm cm Ankle Left: Right: Point of Measurement: 11 cm From Medial Instep 31.6 cm cm  Vascular Assessment Pulses: Dorsalis Pedis Palpable: [Left:No] Doppler Audible: [Left:Inaudible] Posterior Tibial Palpable: [Left:Yes] Electronic Signature(s) Signed: 02/20/2019 4:37:37 PM By: Harold Barban Entered By: Harold Barban on 02/19/2019 15:46:45 [name] ([id number]) -------------------------------------------------------------------------------- Multi Wound Chart Details Patient Name: [name] Date of Service: 02/19/2019 3:15 PM Medical Record Number: [id number] Patient Account Number: [id number] Date of Birth/Sex: 06/23/1950 (67 y.o. M) Treating RN: Montey Hora Primary Care Dulce Martian: SYSTEM, PCP Other Clinician: Referring Ermias Tomeo: Referral, Self Treating Marcelino Campos/Extender: STONE III, [name] Weeks in Treatment: 14 Vital Signs Height(in): 73 Pulse(bpm): 67 Weight(lbs): 300 Blood Pressure(mmHg): 147/56 Body Mass Index(BMI): 40 Temperature(F): 99.4 Respiratory Rate 16 (breaths/min): Photos: [N/A:N/A] Wound Location: Left Lower Leg - Posterior Left Lower Leg - Midline, N/A Anterior Wounding Event:  Blister Gradually Appeared N/A Primary Etiology: Diabetic Wound/Ulcer of the Venous Leg Ulcer N/A Lower Extremity Comorbid History: Hypertension, Peripheral Hypertension, Peripheral N/A Venous Disease, Type II Venous Disease, Type II Diabetes, Neuropathy, Diabetes, Neuropathy, Received Chemotherapy Received Chemotherapy Date Acquired: 01/01/2019 02/10/2019 N/A Weeks of Treatment: 5 1 N/A Wound Status: Open Open N/A Clustered Wound: No Yes N/A Clustered Quantity: N/A 2 N/A Measurements L x W x D 15x9x0.1 0.5x0.4x0.1 N/A (cm) Area (cm) : 106.029 0.157 N/A Volume (cm) : 10.603 0.016 N/A % Reduction in Area: -3871.10% 92.40% N/A % Reduction in Volume: -3871.20% 92.30% N/A Classification: Grade 1 Full Thickness Without N/A Exposed Support Structures Exudate Amount: Large Medium N/A Exudate Type: Serous Serous N/A Exudate Color: amber amber N/A Wound Margin: Flat and Intact Flat and Intact N/A Granulation Amount: Large (67-100%) Large (67-100%) N/A Granulation Quality: Red Red N/A Necrotic Amount: Small (1-33%) Small (1-33%) N/A TRAELYN, GENTLEMAN ([id number]) Necrotic Tissue: Adherent Mertzon N/A Exposed Structures: Fat Layer (Subcutaneous Fat Layer (Subcutaneous N/A Tissue) Exposed: Yes Tissue) Exposed: Yes Fascia: No Fascia: No Tendon: No Tendon: No Muscle: No Muscle: No Joint: No Joint: No Bone: No Bone: No Epithelialization: None None N/A Treatment Notes Electronic Signature(s) Signed: 02/19/2019 5:32:28 PM By: Montey Hora Entered By: Montey Hora on 02/19/2019 16:20:17 [name] ([id number]) -------------------------------------------------------------------------------- [place] Details Patient Name: [name] Date of Service: 02/19/2019 3:15 PM Medical Record Number: [id number] Patient Account Number: [id number] Date of Birth/Sex: [birth year] (68 y.o. M) Treating RN: Montey Hora Primary Care Orvil Faraone: SYSTEM,  PCP Other Clinician: Referring Adelynne Joerger: Referral, Self Treating Eliya Bubar/Extender: STONE III, [name] Weeks in Treatment: 14 Active Inactive Venous Leg Ulcer Nursing Diagnoses: Actual venous Insuffiency (use after diagnosis is confirmed) Knowledge deficit related to disease process and management Goals: Patient will maintain optimal edema control Date Initiated: 11/12/2018 Target Resolution Date: 03/08/2019 Goal Status: Active Patient/caregiver will verbalize understanding of disease process and disease management Date Initiated: 11/12/2018 Target Resolution Date: 03/08/2019 Goal Status: Active Interventions: Assess peripheral edema status every visit. Compression as ordered Provide education on venous insufficiency Notes: Wound/Skin Impairment Nursing Diagnoses: Impaired tissue integrity Knowledge deficit related to ulceration/compromised skin integrity Goals: Ulcer/skin breakdown will have a volume reduction of 30% by week 4 Date Initiated: 11/12/2018 Target Resolution Date: 03/08/2019 Goal Status: Active Interventions: Assess patient/caregiver ability to obtain necessary supplies Assess patient/caregiver ability to perform ulcer/skin care regimen upon admission and as needed Assess ulceration(s) every visit Provide education on ulcer and skin care Notes: DUSHON, KANU ([id number]) Electronic Signature(s) Signed: 02/19/2019 5:32:28 PM By: Montey Hora Entered By: Montey Hora on 02/19/2019 16:19:58 [name] ([id number]) -------------------------------------------------------------------------------- Pain Assessment Details Patient Name: [name] Date of Service: 02/19/2019 3:15 PM Medical Record Number: [id number] Patient Account Number: [id number] Date of Birth/Sex:  21-Sep-1950 (68 y.o. M) Treating RN: Montey Hora Primary Care Jaydyn Bozzo: SYSTEM, PCP Other Clinician: Referring Ebenezer Mccaskey: Referral, Self Treating Lulamae Skorupski/Extender: STONE III, [name] Weeks in  Treatment: 14 Active Problems Location of Pain Severity and Description of Pain Patient Has Paino No Site Locations Pain Management and Medication Current Pain Management: Electronic Signature(s) Signed: 02/19/2019 4:59:02 PM By: Lorine Bears RCP, RRT, CHT Signed: 02/19/2019 5:32:28 PM By: Montey Hora Entered By: Lorine Bears on 02/19/2019 15:33:19 [name] ([id number]) -------------------------------------------------------------------------------- Patient/Caregiver Education Details Patient Name: [name] Date of Service: 02/19/2019 3:15 PM Medical Record Number: [id number] Patient Account Number: [id number] Date of Birth/Gender: March 13, 1951 (68 y.o. M) Treating RN: Montey Hora Primary Care Physician: SYSTEM, PCP Other Clinician: Referring Physician: Referral, Self Treating Physician/Extender: Melburn Hake, [name] Weeks in Treatment: 14 Education Assessment Education Provided To: Patient Education Topics Provided Venous: Handouts: Other: continue with edema management Methods: Explain/Verbal Responses: State content correctly Electronic Signature(s) Signed: 02/19/2019 5:32:28 PM By: Montey Hora Entered By: Montey Hora on 02/19/2019 16:27:02 [name] ([id number]) -------------------------------------------------------------------------------- Wound Assessment Details Patient Name: [name] Date of Service: 02/19/2019 3:15 PM Medical Record Number: [id number] Patient Account Number: [id number] Date of Birth/Sex: [birth year] (68 y.o. M) Treating RN: Harold Barban Primary Care Tineka Uriegas: SYSTEM, PCP Other Clinician: Referring Francie Keeling: Referral, Self Treating Albaraa Swingle/Extender: STONE III, [name] Weeks in Treatment: 14 Wound Status Wound Number: 8 Primary Diabetic Wound/Ulcer of the Lower Extremity Etiology: Wound Location: Left Lower Leg - Posterior Wound Open Wounding Event: Blister Status: Date Acquired:  01/01/2019 Comorbid Hypertension, Peripheral Venous Disease, Type Weeks Of Treatment: 5 History: II Diabetes, Neuropathy, Received Clustered Wound: No Chemotherapy Photos Wound Measurements Length: (cm) 15 % Reduction i Width: (cm) 9 % Reduction i Depth: (cm) 0.1 Epithelializa Area: (cm) 106.029 Tunneling: Volume: (cm) 10.603 Undermining: n Area: -3871.1% n Volume: -3871.2% tion: None No No Wound Description Classification: Grade 1 Foul Odor Aft Wound Margin: Flat and Intact Slough/Fibrin Exudate Amount: Large Exudate Type: Serous Exudate Color: amber er Cleansing: No o Yes Wound Bed Granulation Amount: Large (67-100%) Exposed Structure Granulation Quality: Red Fascia Exposed: No Necrotic Amount: Small (1-33%) Fat Layer (Subcutaneous Tissue) Exposed: Yes Necrotic Quality: Adherent Slough Tendon Exposed: No Muscle Exposed: No Joint Exposed: No Bone Exposed: No Treatment Notes ERIBERTO, SEDENO ([id number]) Wound #8 (Left, Posterior Lower Leg) Notes silvercel, xtrasorb, 3 layer wrap with unna to anchor Electronic Signature(s) Signed: 02/20/2019 4:37:37 PM By: Harold Barban Entered By: Harold Barban on 02/19/2019 15:49:47 [name] ([id number]) -------------------------------------------------------------------------------- Wound Assessment Details Patient Name: [name] Date of Service: 02/19/2019 3:15 PM Medical Record Number: [id number] Patient Account Number: [id number] Date of Birth/Sex: January 22, 1951 (68 y.o. M) Treating RN: Harold Barban Primary Care Thailyn Khalid: SYSTEM, PCP Other Clinician: Referring Safal Halderman: Referral, Self Treating Elowyn Raupp/Extender: STONE III, [name] Weeks in Treatment: 14 Wound Status Wound Number: 9 Primary Venous Leg Ulcer Etiology: Wound Location: Left Lower Leg - Midline, Anterior Wound Open Wounding Event: Gradually Appeared Status: Date Acquired: 02/10/2019 Comorbid Hypertension, Peripheral Venous Disease, Type Weeks Of  Treatment: 1 History: II Diabetes, Neuropathy, Received Clustered Wound: Yes Chemotherapy Photos Wound Measurements Length: (cm) 0.5 Width: (cm) 0.4 Depth: (cm) 0.1 Clustered Quantity: 2 Area: (cm) 0.157 Volume: (cm) 0.016 % Reduction in Area: 92.4% % Reduction in Volume: 92.3% Epithelialization: None Tunneling: No Undermining: No Wound Description Full Thickness Without Exposed Support Foul O Classification: Structures Slough Wound Margin: Flat and Intact Exudate Medium Amount: Exudate Type: Serous Exudate Color: amber dor After Cleansing: No /Fibrino No Wound Bed Granulation Amount: Large (67-100%)  Exposed Structure Granulation Quality: Red Fascia Exposed: No Necrotic Amount: Small (1-33%) Fat Layer (Subcutaneous Tissue) Exposed: Yes Necrotic Quality: Eschar, Adherent Slough Tendon Exposed: No Muscle Exposed: No Joint Exposed: No Bone Exposed: No Armetta, [name] ([id number]) Treatment Notes Wound #9 (Left, Midline, Anterior Lower Leg) Notes silvercel, xtrasorb, 3 layer wrap with unna to anchor Electronic Signature(s) Signed: 02/20/2019 4:37:37 PM By: Harold Barban Entered By: Harold Barban on 02/19/2019 15:50:20 [name] ([id number]) -------------------------------------------------------------------------------- [place] Details Patient Name: [name] Date of Service: 02/19/2019 3:15 PM Medical Record Number: [id number] Patient Account Number: [id number] Date of Birth/Sex: Mar 11, 1951 (68 y.o. M) Treating RN: Montey Hora Primary Care Trajan Grove: SYSTEM, PCP Other Clinician: Referring Elmore Hyslop: Referral, Self Treating Vivica Dobosz/Extender: STONE III, [name] Weeks in Treatment: 14 Vital Signs Time Taken: 15:30 Temperature (F): 99.4 Height (in): 73 Pulse (bpm): 57 Weight (lbs): 300 Respiratory Rate (breaths/min): 16 Body Mass Index (BMI): 39.6 Blood Pressure (mmHg): 147/56 Reference Range: 80 - 120 mg / dl Electronic Signature(s) Signed:  02/19/2019 4:59:02 PM By: Lorine Bears RCP, RRT, CHT Entered By: Lorine Bears on 02/19/2019 15:35:00

## 2019-02-20 NOTE — Progress Notes (Addendum)
MUTASIM, BEYE ([id number]) Visit Report for 02/19/2019 Chief Complaint Document Details Patient Name: [name], [name] Date of Service: 02/19/2019 3:15 PM Medical Record Number: [id number] Patient Account Number: [id number] Date of Birth/Sex: 10-03-1950 (68 y.o. M) Treating RN: Montey Hora Primary Care Provider: SYSTEM, PCP Other Clinician: Referring Provider: Referral, Self Treating Provider/Extender: Melburn Hake, Lyle Niblett Weeks in Treatment: 14 Information Obtained from: Patient Chief Complaint Left LE ulcers Electronic Signature(s) Signed: 02/19/2019 4:01:55 PM By: Worthy Keeler PA-C Entered By: Worthy Keeler on 02/19/2019 16:01:55 [name] ([id number]) -------------------------------------------------------------------------------- HPI Details Patient Name: [name] Date of Service: 02/19/2019 3:15 PM Medical Record Number: [id number] Patient Account Number: [id number] Date of Birth/Sex: November 24, 1950 (68 y.o. M) Treating RN: Montey Hora Primary Care Provider: SYSTEM, PCP Other Clinician: Referring Provider: Referral, Self Treating Provider/Extender: STONE III, Pavielle Biggar Weeks in Treatment: 14 History of Present Illness HPI Description: 68 year old male who presented to the ER with bilateral lower extremity blisters which had started last week. he has a past medical history of leukemia, diabetes mellitus, hypertension, edema of both lower extremities, his recurrent skin infections, peripheral vascular disease, coronary artery disease, congestive heart failure and peripheral neuropathy. in the ER he was given Rocephin and put on Silvadene cream. he was put on oral doxycycline and was asked to follow-up with the [facility]. His last hemoglobin A1c was 6.6 in December and he checks his blood sugar once a week. He does not have any physicians outside the [place] system. He does not recall any vascular duplex studies done either for arterial or venous disease but was told to wear  compression stockings which he does not use 05/30/2016 -- we have not yet received any of his notes from the [facility] hospital system and his arterial and venous duplex studies are scheduled here in [place] around mid February. We are unable to have his insurance accepted by home health agencies and hence he is getting dressings only once a week. 06/06/16 -- -- I received a call from the patient's PCP at the [facility] and spoke to Dr. Garvin Fila, phone number [phone] and fax number [phone]. She confirmed that no vascular testing was done over the last 5 years and she would be happy to do them if the patient did want them to be done at the [place] and we could fax him a request. Readmission: 68 year old male seen by as in February of this year and was referred to vein and vascular for studies and opinion from the vascular surgeons. The patient returns today with a fresh problem having had blisters on his left lower extremity which have been there for about 5 days and he clearly states that he has been wearing his compression stockings as advised though he could not read the moderate compression and has been wearing light compression. Review of his electronic medical records note that he had lower extremity arterial duplex examination done on 06/23/2016 which showed no hemodynamically significant stenosis in the bilateral lower extremity arterial system. He also had a lower extremity venous reflux examination done on 07/07/2016 and it was noted that he had venous incompetence in the right great saphenous vein and bilateral common femoral veins. Patient was seen by Dr. Tamala Julian on the same day and for some reason his notes do not reflect the venous studies or the arterial studies and he recommended patient do a venous duplex ultrasound to look for reflux and return to see him.he would also consider a lymph pump if required. The patient was told that  his workup was normal and hence the  patient canceled his follow-up appointment. 02/03/17 on evaluation today patient left medial lower extremity blister appears to be doing about the same. It is still continuing to drain and there's still the blistered skin covering the wound bed which is making it difficult for the alternate to do its job. Fortunately there is no evidence of cellulitis. No fevers chills noted. Patient states in general he is not having any significant discomfort. Patient's lower extremity arterial duplex exam revealed that patient was hemodynamically stable with no evidence of stenosis in regard to the bilateral lower extremities. The lower extremity venous reflux exam revealed the patient had venous incontinence noted in the right greater saphenous and bilateral common femoral vein. There is no evidence of deep or superficial vein thrombosis in the bilateral lower extremities. Readmission: [name], [name] ([id number]) 11/12/18 Patient presents for evaluation our clinic today concerning issues that he is having with his left lower extremity. He tells me that a couple weeks ago he began developing blisters on the left lower extremity along with increased swelling. He typically wears his compression stockings on a regular basis is previously been evaluated both here as well is with vascular surgery they would recommend lymphedema pumps but unfortunately that somehow fell through and he never heard anything back from that. Nonetheless I think lymphedema pumps would be beneficial for this patient. He does have a history of hypertension and diabetes. Obviously the chronic venous stasis and lymphedema as well. At this point the blisters have been given in more trouble he states sometimes when the blisters openings able to clean it down with alcohol and it will dry out and do well. Unfortunately that has not been the case this time. He is having some discomfort although this mean these with cleaning the areas he doesn't have  discomfort just on a regular basis. He has not been able to wear his compression stockings since the blisters arose due to the fact that of course it will drain into the socks causing additional issues and he didn't have any way to wrap this otherwise. He has increased to taking his Lasix every day instead of every other day. He sees his primary care provider later this month as well. No fevers, chills, nausea, or vomiting noted at this time. 11/19/18-Patient returns at 1 week, per intake RN the amount of seepage into the compression wraps was definitely improved, overall all the wounds are measuring smaller but continuing silver alginate to the wounds as primary dressing 11/26/18 on evaluation today patient appears to be doing quite well in regard to his left lower Trinity ulcers. In fact of the areas that were noted initially he only has two regions still open. There is no evidence of active infection at this time. He still is not heard anything from the company regarding lymphedema pumps as of yet. Again as previously seen vascular they have not recommended any surgical intervention. 12/03/2018 on evaluation today patient actually appears to be doing quite well with regard to his lower extremity ulcers. In fact most of the areas appear to be healed the one spot which does not seem to be completely healed I am unsure of whether or not this is really draining that much but nonetheless there does not appear to be any signs of infection or significant drainage at this point. There is no sign of fever, chills, nausea, vomiting, or diarrhea. Overall I am pleased with how things have progressed I think is very close  to being able to transition to his home compression stockings. 12/10/2018 upon evaluation today patient appears to be doing quite well with regard to his left lower extremity. He has been tolerating the dressing changes without complication. Fortunately there is no signs of active infection at  this time. He appears after thorough evaluation of his leg to only have 1 small area that remains open at this point everything else appears to be almost completely closed. He still have significant swelling of the left lower extremity. We had discussed discussing this with his primary care provider he is not able to see her in person they were at the [facility] and right now the [place] is not seeing patients on site. According to the patient anyway. Subsequently he did speak with her apparently and his primary care provider feels that he may likely have a DVT. With that being said she has not seen his leg she is just going off of his history. Nonetheless that is a concern that the patient now has as well and while I do not feel the DVT is likely we can definitely ensure that that is not the case I will go ahead and see about putting that order in today. Nonetheless otherwise I am in a recommend that we continue with the current wound care measures including the compression therapy most likely. We just need to ensure that his leg is indeed free of any DVTs. 12/17/2018 on evaluation today patient actually appears to be completely healed today. He does have 2 very small areas of blistering although this is not anything too significant at this point which is good news. With that being said I am in agreement with the fact that I think he is completely healed at this point. He does want to get back into his compression stocking. The good news is we have gotten approval from insurance for his lymphedema pumps we received a letter since last saw him last week. The other good news is his study did come back and showed no evidence of a DVT. 12/20/2018 on evaluation today patient presents for follow-up concerning his ongoing issues with his left lower extremity. He was actually discharged last Friday and did fairly well until he states blisters opened this morning. He tells me he has been wearing his compression  stocking although he has a hard time getting this on. There does not appear to be any signs of active infection at this time. No fevers, chills, nausea, vomiting, or diarrhea. 12/27/2018 on evaluation today patient appears to be doing very well with regard to his swelling of the left lower extremity the 4 layer compression wrap seems to have been beneficial for him. Fortunately there is no signs of active infection at this time. Patient has been tolerating the compression wrap without complication and his foot swelling in particular appears to be greatly improved. He does still have a wound on the lateral portion of his left leg I believe this is more of a blister that has now reopened. 01/03/2019 on evaluation today patient actually appears to be doing excellent in regard to his left lower extremity. He did receive his compression pumps and is actually use this 7 times since he was last here in the office. On top of the compression wrap he is now roughly 3 cm better at the calf and 2 cm better at the ankle he also states that his foot seem to go an issue better without even having to use a shoe horn.  Obviously I think this is all evidence that he is doing excellent in this regard. The other good news is he does not appear to have anything open today as far as wounds are concerned. [name], [name] ([id number]) 01/15/2019 on evaluation today patient appears to be doing more poorly yet again with regard to his left lower extremity. He has developed new wounds again after being discharged just recently. Unfortunately this continues to be the case that he will heal and then have subsequent new wounds. The last time I was hopeful that he may not end up coming back too quickly especially since he states he has been using his lymphedema pumps along with wearing his compression. Nonetheless he had a blister on the back of his leg that popped up on the left and this has opened up into an ulceration it is quite  painful. 01/22/19 on evaluation today patient actually appears to be doing well with regard to his wound on the left lower extremity. He's been tolerating the dressing changes without complication including the compression wrap in the wound appears to be significantly smaller today which is great news. Overall very pleased in this regard. 01/29/2019 on evaluation today patient appears to be doing well with regard to his left posterior lower extremity ulcer. He has been tolerating the dressing changes without complication. This is not completely healed but is getting much closer. We did order a Farrow wrap 4000 for him he has received this and has it with him today although I am not sure we are quite ready to start him on that as of yet. We are very close. 02/05/2019 on evaluation today patient actually appears to be doing quite well with regard to his left posterior lower extremity ulcer. He still has a very tiny opening remaining but the fortunate thing is he seems to be healing quite nicely. He also did get his Farrow wrap which I am hoping will help with his edema control as well at home. Fortunately there is no evidence of active infection. 02/12/2019 patient and fortunately appears to be doing poorly in regard to his wounds of the left lower extremity. He was very close to healing therefore we attempted to use his Velcro compression wraps continuing with lymphedema pumps at home. Unfortunately that does not seem to have done very well for him. He tells me that he wore them all the time but again I am not sure why if that is the case that he is having such significant edema. He is still on his fluid pills as well. With that being said there is no obvious sign of infection although I do wonder about the possibility of infection at this time as well. 02/19/2019 unfortunately upon evaluation today patient appears to be doing more poorly with regard to his left lower extremity. He is not showing signs  of significant improvement and I think the biggest issue here is that he does have an infection that appears to likely be Pseudomonas. That is based on the blue-green drainage that were noted at this time. Unfortunately the antibiotic that has been on is not going to take care of this at all. I think they will get a need to switch him to either Levaquin or Cipro and this was discussed with the patient. Electronic Signature(s) Signed: 02/20/2019 6:12:50 PM By: Worthy Keeler PA-C Entered By: Worthy Keeler on 02/20/2019 13:11:35 [name] ([id number]) -------------------------------------------------------------------------------- Physical Exam Details Patient Name: [name] Date of Service: 02/19/2019 3:15 PM Medical  Record Number: [id number] Patient Account Number: [id number] Date of Birth/Sex: [birth year] (68 y.o. M) Treating RN: Montey Hora Primary Care Provider: SYSTEM, PCP Other Clinician: Referring Provider: Referral, Self Treating Provider/Extender: STONE III, Kalene Cutler Weeks in Treatment: 67 Constitutional Well-nourished and well-hydrated in no acute distress. Respiratory normal breathing without difficulty. clear to auscultation bilaterally. Cardiovascular regular rate and rhythm with normal S1, S2. Psychiatric this patient is able to make decisions and demonstrates good insight into disease process. Alert and Oriented x 3. pleasant and cooperative. Notes In regard to the patient's wound on left lower extremity posteriorly this still shows signs of excessive drainage and this is blue- green in color which I think does indicate a likely Pseudomonas infection unfortunately. He has been tolerating the dressing changes including the wraps without complication though the XtraSorb is filling up very rapidly I think we may need to bring him back for nurse visit on Friday as well. Electronic Signature(s) Signed: 02/20/2019 6:12:50 PM By: Worthy Keeler PA-C Entered By: Worthy Keeler on 02/20/2019 13:12:12 [name] ([id number]) -------------------------------------------------------------------------------- Physician Orders Details Patient Name: [name] Date of Service: 02/19/2019 3:15 PM Medical Record Number: [id number] Patient Account Number: [id number] Date of Birth/Sex: 01-31-1951 (68 y.o. M) Treating RN: Montey Hora Primary Care Provider: SYSTEM, PCP Other Clinician: Referring Provider: Referral, Self Treating Provider/Extender: STONE III, Waneda Klammer Weeks in Treatment: 14 Verbal / Phone Orders: No Diagnosis Coding ICD-10 Coding Code Description E11.622 Type 2 diabetes mellitus with other skin ulcer I89.0 Lymphedema, not elsewhere classified I87.2 Venous insufficiency (chronic) (peripheral) L97.822 Non-pressure chronic ulcer of other part of left lower leg with fat layer exposed I10 Essential (primary) hypertension Wound Cleansing Wound #8 Left,Posterior Lower Leg o May shower with protection. - Please do not get your wrap wet Wound #9 Left,Midline,Anterior Lower Leg o May shower with protection. - Please do not get your wrap wet Primary Wound Dressing Wound #8 Left,Posterior Lower Leg o Silver Alginate Wound #9 Left,Midline,Anterior Lower Leg o Silver Alginate Secondary Dressing Wound #8 Left,Posterior Lower Leg o ABD pad o XtraSorb Wound #9 Left,Midline,Anterior Lower Leg o ABD pad Dressing Change Frequency Wound #8 Left,Posterior Lower Leg o Change dressing every week Wound #9 Left,Midline,Anterior Lower Leg o Change dressing every week Follow-up Appointments Wound #8 Left,Posterior Lower Leg o Return Appointment in 1 week. o Nurse Visit as needed - Friday 02/22/19 [name] ([id number]) Edema Control Wound #8 Left,Posterior Lower Leg o 3 Layer Compression System - Left Lower Extremity Wound #9 Left,Midline,Anterior Lower Leg o 3 Layer Compression System - Left Lower  Extremity Laboratory o Bacteria identified in Wound by Culture (MICRO) oooo LOINC Code: [id number] oooo Convenience Name: Wound culture routine Patient Medications Allergies: No Known Drug Allergies Notifications Medication Indication Start End Levaquin 02/19/2019 DOSE 1 - oral 750 mg tablet - 1 tablet oral taken daily for 15 days. Interactions with diabetes medications noted but this medication is needed for current infection Electronic Signature(s) Signed: 02/19/2019 4:31:16 PM By: Worthy Keeler PA-C Entered By: Worthy Keeler on 02/19/2019 16:31:15 [name] ([id number]) -------------------------------------------------------------------------------- Problem List Details Patient Name: [name] Date of Service: 02/19/2019 3:15 PM Medical Record Number: [id number] Patient Account Number: [id number] Date of Birth/Sex: Oct 10, 1950 (68 y.o. M) Treating RN: Montey Hora Primary Care Provider: SYSTEM, PCP Other Clinician: Referring Provider: Referral, Self Treating Provider/Extender: STONE III, Teasia Zapf Weeks in Treatment: 14 Active Problems ICD-10 Evaluated Encounter Code Description Active Date Today Diagnosis E11.622 Type 2 diabetes mellitus with other skin ulcer 11/12/2018 No Yes I89.0  Lymphedema, not elsewhere classified 11/12/2018 No Yes I87.2 Venous insufficiency (chronic) (peripheral) 11/12/2018 No Yes L97.822 Non-pressure chronic ulcer of other part of left lower leg with 11/12/2018 No Yes fat layer exposed I10 Essential (primary) hypertension 11/12/2018 No Yes Inactive Problems Resolved Problems Electronic Signature(s) Signed: 02/19/2019 4:01:47 PM By: Worthy Keeler PA-C Entered By: Worthy Keeler on 02/19/2019 16:01:47 [name] ([id number]) -------------------------------------------------------------------------------- Progress Note Details Patient Name: [name] Date of Service: 02/19/2019 3:15 PM Medical Record Number: [id number] Patient Account Number:  [id number] Date of Birth/Sex: Jul 07, 1950 (68 y.o. M) Treating RN: Montey Hora Primary Care Provider: SYSTEM, PCP Other Clinician: Referring Provider: Referral, Self Treating Provider/Extender: STONE III, Nohelani Benning Weeks in Treatment: 14 Subjective Chief Complaint Information obtained from Patient Left LE ulcers History of Present Illness (HPI) 68 year old male who presented to the ER with bilateral lower extremity blisters which had started last week. he has a past medical history of leukemia, diabetes mellitus, hypertension, edema of both lower extremities, his recurrent skin infections, peripheral vascular disease, coronary artery disease, congestive heart failure and peripheral neuropathy. in the ER he was given Rocephin and put on Silvadene cream. he was put on oral doxycycline and was asked to follow-up with the [facility]. His last hemoglobin A1c was 6.6 in December and he checks his blood sugar once a week. He does not have any physicians outside the [place] system. He does not recall any vascular duplex studies done either for arterial or venous disease but was told to wear compression stockings which he does not use 05/30/2016 -- we have not yet received any of his notes from the [facility] hospital system and his arterial and venous duplex studies are scheduled here in [place] around mid February. We are unable to have his insurance accepted by home health agencies and hence he is getting dressings only once a week. 06/06/16 -- -- I received a call from the patient's PCP at the [facility] at [facility] and spoke to Dr. Garvin Fila, phone number [phone] and fax number [phone]. She confirmed that no vascular testing was done over the last 5 years and she would be happy to do them if the patient did want them to be done at the [place] and we could fax him a request. Readmission: 68 year old male seen by as in February of this year and was referred to vein and vascular for studies and opinion from  the vascular surgeons. The patient returns today with a fresh problem having had blisters on his left lower extremity which have been there for about 5 days and he clearly states that he has been wearing his compression stockings as advised though he could not read the moderate compression and has been wearing light compression. Review of his electronic medical records note that he had lower extremity arterial duplex examination done on 06/23/2016 which showed no hemodynamically significant stenosis in the bilateral lower extremity arterial system. He also had a lower extremity venous reflux examination done on 07/07/2016 and it was noted that he had venous incompetence in the right great saphenous vein and bilateral common femoral veins. Patient was seen by Dr. Tamala Julian on the same day and for some reason his notes do not reflect the venous studies or the arterial studies and he recommended patient do a venous duplex ultrasound to look for reflux and return to see him.he would also consider a lymph pump if required. The patient was told that his workup was normal and hence the patient canceled his follow-up appointment. 02/03/17 on  evaluation today patient left medial lower extremity blister appears to be doing about the same. It is still continuing to drain and there's still the blistered skin covering the wound bed which is making it difficult for the alternate to do its job. Fortunately there is no evidence of cellulitis. No fevers chills noted. Patient states in general he is not having any significant discomfort. Patient's lower extremity arterial duplex exam revealed that patient was hemodynamically stable with no evidence of stenosis in regard to the bilateral lower extremities. The lower extremity venous reflux exam revealed the patient had venous incontinence noted in the right greater saphenous [place], [name] ([id number]) and bilateral common femoral vein. There is no evidence of deep or  superficial vein thrombosis in the bilateral lower extremities. Readmission: 11/12/18 Patient presents for evaluation our clinic today concerning issues that he is having with his left lower extremity. He tells me that a couple weeks ago he began developing blisters on the left lower extremity along with increased swelling. He typically wears his compression stockings on a regular basis is previously been evaluated both here as well is with vascular surgery they would recommend lymphedema pumps but unfortunately that somehow fell through and he never heard anything back from that. Nonetheless I think lymphedema pumps would be beneficial for this patient. He does have a history of hypertension and diabetes. Obviously the chronic venous stasis and lymphedema as well. At this point the blisters have been given in more trouble he states sometimes when the blisters openings able to clean it down with alcohol and it will dry out and do well. Unfortunately that has not been the case this time. He is having some discomfort although this mean these with cleaning the areas he doesn't have discomfort just on a regular basis. He has not been able to wear his compression stockings since the blisters arose due to the fact that of course it will drain into the socks causing additional issues and he didn't have any way to wrap this otherwise. He has increased to taking his Lasix every day instead of every other day. He sees his primary care provider later this month as well. No fevers, chills, nausea, or vomiting noted at this time. 11/19/18-Patient returns at 1 week, per intake RN the amount of seepage into the compression wraps was definitely improved, overall all the wounds are measuring smaller but continuing silver alginate to the wounds as primary dressing 11/26/18 on evaluation today patient appears to be doing quite well in regard to his left lower [place] ulcers. In fact of the areas that were noted initially  he only has two regions still open. There is no evidence of active infection at this time. He still is not heard anything from the company regarding lymphedema pumps as of yet. Again as previously seen vascular they have not recommended any surgical intervention. 12/03/2018 on evaluation today patient actually appears to be doing quite well with regard to his lower extremity ulcers. In fact most of the areas appear to be healed the one spot which does not seem to be completely healed I am unsure of whether or not this is really draining that much but nonetheless there does not appear to be any signs of infection or significant drainage at this point. There is no sign of fever, chills, nausea, vomiting, or diarrhea. Overall I am pleased with how things have progressed I think is very close to being able to transition to his home compression stockings. 12/10/2018 upon evaluation today  patient appears to be doing quite well with regard to his left lower extremity. He has been tolerating the dressing changes without complication. Fortunately there is no signs of active infection at this time. He appears after thorough evaluation of his leg to only have 1 small area that remains open at this point everything else appears to be almost completely closed. He still have significant swelling of the left lower extremity. We had discussed discussing this with his primary care provider he is not able to see her in person they were at the [facility] and right now the [place] is not seeing patients on site. According to the patient anyway. Subsequently he did speak with her apparently and his primary care provider feels that he may likely have a DVT. With that being said she has not seen his leg she is just going off of his history. Nonetheless that is a concern that the patient now has as well and while I do not feel the DVT is likely we can definitely ensure that that is not the case I will go ahead and see about  putting that order in today. Nonetheless otherwise I am in a recommend that we continue with the current wound care measures including the compression therapy most likely. We just need to ensure that his leg is indeed free of any DVTs. 12/17/2018 on evaluation today patient actually appears to be completely healed today. He does have 2 very small areas of blistering although this is not anything too significant at this point which is good news. With that being said I am in agreement with the fact that I think he is completely healed at this point. He does want to get back into his compression stocking. The good news is we have gotten approval from insurance for his lymphedema pumps we received a letter since last saw him last week. The other good news is his study did come back and showed no evidence of a DVT. 12/20/2018 on evaluation today patient presents for follow-up concerning his ongoing issues with his left lower extremity. He was actually discharged last Friday and did fairly well until he states blisters opened this morning. He tells me he has been wearing his compression stocking although he has a hard time getting this on. There does not appear to be any signs of active infection at this time. No fevers, chills, nausea, vomiting, or diarrhea. 12/27/2018 on evaluation today patient appears to be doing very well with regard to his swelling of the left lower extremity the 4 layer compression wrap seems to have been beneficial for him. Fortunately there is no signs of active infection at this time. Patient has been tolerating the compression wrap without complication and his foot swelling in particular appears to be greatly improved. He does still have a wound on the lateral portion of his left leg I believe this is more of a blister that has now reopened. [name], [name] ([id number]) 01/03/2019 on evaluation today patient actually appears to be doing excellent in regard to his left lower  extremity. He did receive his compression pumps and is actually use this 7 times since he was last here in the office. On top of the compression wrap he is now roughly 3 cm better at the calf and 2 cm better at the ankle he also states that his foot seem to go an issue better without even having to use a shoe horn. Obviously I think this is all evidence that he is doing  excellent in this regard. The other good news is he does not appear to have anything open today as far as wounds are concerned. 01/15/2019 on evaluation today patient appears to be doing more poorly yet again with regard to his left lower extremity. He has developed new wounds again after being discharged just recently. Unfortunately this continues to be the case that he will heal and then have subsequent new wounds. The last time I was hopeful that he may not end up coming back too quickly especially since he states he has been using his lymphedema pumps along with wearing his compression. Nonetheless he had a blister on the back of his leg that popped up on the left and this has opened up into an ulceration it is quite painful. 01/22/19 on evaluation today patient actually appears to be doing well with regard to his wound on the left lower extremity. He's been tolerating the dressing changes without complication including the compression wrap in the wound appears to be significantly smaller today which is great news. Overall very pleased in this regard. 01/29/2019 on evaluation today patient appears to be doing well with regard to his left posterior lower extremity ulcer. He has been tolerating the dressing changes without complication. This is not completely healed but is getting much closer. We did order a Farrow wrap 4000 for him he has received this and has it with him today although I am not sure we are quite ready to start him on that as of yet. We are very close. 02/05/2019 on evaluation today patient actually appears to be doing  quite well with regard to his left posterior lower extremity ulcer. He still has a very tiny opening remaining but the fortunate thing is he seems to be healing quite nicely. He also did get his Farrow wrap which I am hoping will help with his edema control as well at home. Fortunately there is no evidence of active infection. 02/12/2019 patient and fortunately appears to be doing poorly in regard to his wounds of the left lower extremity. He was very close to healing therefore we attempted to use his Velcro compression wraps continuing with lymphedema pumps at home. Unfortunately that does not seem to have done very well for him. He tells me that he wore them all the time but again I am not sure why if that is the case that he is having such significant edema. He is still on his fluid pills as well. With that being said there is no obvious sign of infection although I do wonder about the possibility of infection at this time as well. 02/19/2019 unfortunately upon evaluation today patient appears to be doing more poorly with regard to his left lower extremity. He is not showing signs of significant improvement and I think the biggest issue here is that he does have an infection that appears to likely be Pseudomonas. That is based on the blue-green drainage that were noted at this time. Unfortunately the antibiotic that has been on is not going to take care of this at all. I think they will get a need to switch him to either Levaquin or Cipro and this was discussed with the patient. Patient History Information obtained from Patient. Family History Cancer - Mother, Stroke - Father, No family history of Diabetes, Heart Disease, Hereditary Spherocytosis, Hypertension, Kidney Disease, Lung Disease, Seizures, Thyroid Problems, Tuberculosis. Social History Former smoker - quit 5 years ago, Marital Status - Divorced, Alcohol Use - Never, Drug Use -  No History, Caffeine Use - Daily. Medical  History Cardiovascular Patient has history of Hypertension, Peripheral Venous Disease Denies history of Angina, Arrhythmia, Congestive Heart Failure, Coronary Artery Disease, Deep Vein Thrombosis, Hypotension, Myocardial Infarction, Peripheral Arterial Disease, Phlebitis, Vasculitis Endocrine Patient has history of Type II Diabetes Integumentary (Skin) Denies history of History of Burn, History of pressure wounds [name], [name] ([id number]) Neurologic Patient has history of Neuropathy Denies history of Dementia, Quadriplegia, Paraplegia, Seizure Disorder Oncologic Patient has history of Received Chemotherapy Denies history of Received Radiation Medical And Surgical History Notes 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 Respiratory Denies complaints or symptoms of Chronic or frequent coughs, Shortness of Breath. Cardiovascular Complains or has symptoms of LE edema. Denies complaints or symptoms of Chest pain. Psychiatric Denies complaints or symptoms of Anxiety, Claustrophobia. Objective Constitutional Well-nourished and well-hydrated in no acute distress. Vitals Time Taken: 3:30 PM, Height: 73 in, Weight: 300 lbs, BMI: 39.6, Temperature: 99.4 F, Pulse: 57 bpm, Respiratory Rate: 16 breaths/min, Blood Pressure: 147/56 mmHg. Respiratory normal breathing without difficulty. clear to auscultation bilaterally. Cardiovascular regular rate and rhythm with normal S1, S2. Psychiatric this patient is able to make decisions and demonstrates good insight into disease process. Alert and Oriented x 3. pleasant and cooperative. General Notes: In regard to the patient's wound on left lower extremity posteriorly this still shows signs of excessive drainage and this is blue-green in color which I think does indicate a likely Pseudomonas infection  unfortunately. He has been tolerating the dressing changes including the wraps without complication though the XtraSorb is filling up very rapidly I think we may need to bring him back for nurse visit on Friday as well. Integumentary (Hair, Skin) Wound #8 status is Open. Original cause of wound was Blister. The wound is located on the Left,Posterior Lower Leg. The wound measures 15cm length x 9cm width x 0.1cm depth; 106.029cm^2 area and 10.603cm^3 volume. There is Fat Layer (Subcutaneous Tissue) Exposed exposed. There is no tunneling or undermining noted. There is a large amount of serous [place], [name] ([id number]) drainage noted. The wound margin is flat and intact. There is large (67-100%) red granulation within the wound bed. There is a small (1-33%) amount of necrotic tissue within the wound bed including Adherent Slough. Wound #9 status is Open. Original cause of wound was Gradually Appeared. The wound is located on the Left,Midline,Anterior Lower Leg. The wound measures 0.5cm length x 0.4cm width x 0.1cm depth; 0.157cm^2 area and 0.016cm^3 volume. There is Fat Layer (Subcutaneous Tissue) Exposed exposed. There is no tunneling or undermining noted. There is a medium amount of serous drainage noted. The wound margin is flat and intact. There is large (67-100%) red granulation within the wound bed. There is a small (1-33%) amount of necrotic tissue within the wound bed including Eschar and Adherent Slough. Assessment Active Problems ICD-10 Type 2 diabetes mellitus with other skin ulcer Lymphedema, not elsewhere classified Venous insufficiency (chronic) (peripheral) Non-pressure chronic ulcer of other part of left lower leg with fat layer exposed Essential (primary) hypertension Procedures Wound #8 Pre-procedure diagnosis of Wound #8 is a Diabetic Wound/Ulcer of the Lower Extremity located on the Left,Posterior Lower Leg . There was a Three Layer Compression Therapy Procedure with a  pre-treatment ABI of 1.5 by Montey Hora, RN. Post procedure Diagnosis Wound #8: Same as Pre-Procedure Wound #9 Pre-procedure diagnosis of Wound #9 is a Venous Leg Ulcer  located on the Left,Midline,Anterior Lower Leg . There was a Three Layer Compression Therapy Procedure with a pre-treatment ABI of 1.5 by Montey Hora, RN. Post procedure Diagnosis Wound #9: Same as Pre-Procedure Plan Wound Cleansing: Wound #8 Left,Posterior Lower Leg: May shower with protection. - Please do not get your wrap wet Wound #9 Left,Midline,Anterior Lower Leg: May shower with protection. - Please do not get your wrap wet Primary Wound Dressing: Wound #8 Left,Posterior Lower Leg: Silver Alginate Wound #9 Left,Midline,Anterior Lower Leg: [name], [name] ([id number]) Silver Alginate Secondary Dressing: Wound #8 Left,Posterior Lower Leg: ABD pad XtraSorb Wound #9 Left,Midline,Anterior Lower Leg: ABD pad Dressing Change Frequency: Wound #8 Left,Posterior Lower Leg: Change dressing every week Wound #9 Left,Midline,Anterior Lower Leg: Change dressing every week Follow-up Appointments: Wound #8 Left,Posterior Lower Leg: Return Appointment in 1 week. Nurse Visit as needed - Friday 02/22/19 Edema Control: Wound #8 Left,Posterior Lower Leg: 3 Layer Compression System - Left Lower Extremity Wound #9 Left,Midline,Anterior Lower Leg: 3 Layer Compression System - Left Lower Extremity Laboratory ordered were: Wound culture routine The following medication(s) was prescribed: Levaquin oral 750 mg tablet 1 1 tablet oral taken daily for 15 days. Interactions with diabetes medications noted but this medication is needed for current infection starting 02/19/2019 1. At this point my suggestion is going to be that we go ahead and initiate treatment with Levaquin as I think this is good to be the best option for him getting the Pseudomonas infection under control. I did obtain a wound culture today which we will  send in for evaluation and depending on the results of the culture I will make any adjustments as necessary but again I think the type of drainage she is experiencing is very specific to Pseudomonas. 2. I am also going to suggest a silver alginate dressing applied to the wound bed followed by the 3 layer compression wrap which does seem to help control his edema quite well. 3. I am going to recommend a nurse visit this coming Friday just due to the fact that he is having such significant drainage I think that he is going to need to have this changed more frequently than just once a week until we get the infection under better control. 4. If he is not doing significantly better with the oral antibiotics then potentially he may need IV antibiotics possibly even admission to the hospital my hope is though he will be doing much better by next week. We will see patient back for reevaluation in 1 week here in the clinic. If anything worsens or changes patient will contact our office for additional recommendations. Electronic Signature(s) Signed: 02/20/2019 6:12:50 PM By: Worthy Keeler PA-C Entered By: Worthy Keeler on 02/20/2019 13:13:24 [name] ([id number]) -------------------------------------------------------------------------------- ROS/PFSH Details Patient Name: [name] Date of Service: 02/19/2019 3:15 PM Medical Record Number: [id number] Patient Account Number: [id number] Date of Birth/Sex: April 25, 1951 (68 y.o. M) Treating RN: Montey Hora Primary Care Provider: SYSTEM, PCP Other Clinician: Referring Provider: Referral, Self Treating Provider/Extender: STONE III, Sayge Brienza Weeks in Treatment: 14 Information Obtained From Patient Constitutional Symptoms ([place]) Complaints and Symptoms: Negative for: Fatigue; Fever; Chills; Marked Weight Change Respiratory Complaints and Symptoms: Negative for: Chronic or frequent coughs; Shortness of  Breath Cardiovascular Complaints and Symptoms: Positive for: LE edema Negative for: Chest pain Medical History: Positive for: Hypertension; Peripheral Venous Disease Negative for: Angina; Arrhythmia; Congestive Heart Failure; Coronary Artery Disease; Deep Vein Thrombosis; Hypotension; Myocardial Infarction; Peripheral Arterial Disease; Phlebitis; Vasculitis Psychiatric Complaints and Symptoms:  Negative for: Anxiety; Claustrophobia Endocrine Medical History: Positive for: Type II Diabetes Treated with: Oral agents Integumentary (Skin) Medical History: Negative for: History of Burn; History of pressure wounds Neurologic Medical History: Positive for: Neuropathy Negative for: Dementia; Quadriplegia; Paraplegia; Seizure Disorder Oncologic Medical History: Positive for: Received Chemotherapy [name], [name] ([id number]) Negative for: Received Radiation Past Medical History Notes: 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 Immunizations Pneumococcal Vaccine: Received Pneumococcal Vaccination: Yes Immunization Notes: up to date Implantable Devices None Family and Social History Cancer: Yes - Mother; Diabetes: No; Heart Disease: No; Hereditary Spherocytosis: No; Hypertension: No; Kidney Disease: No; Lung Disease: No; Seizures: No; Stroke: Yes - Father; Thyroid Problems: No; Tuberculosis: No; Former smoker - quit 5 years ago; Marital Status - Divorced; Alcohol Use: Never; Drug Use: No History; Caffeine Use: Daily; Financial Concerns: No; Food, Clothing or Shelter Needs: No; Support System Lacking: No; Transportation Concerns: No Physician Affirmation I have reviewed and agree with the above information. Electronic Signature(s) Signed: 02/20/2019 4:44:26 PM By: Montey Hora Signed: 02/20/2019 6:12:50 PM By: Worthy Keeler PA-C Entered By: Worthy Keeler on 02/20/2019 13:11:56 [name]  ([id number]) -------------------------------------------------------------------------------- SuperBill Details Patient Name: [name] Date of Service: 02/19/2019 Medical Record Number: [id number] Patient Account Number: [id number] Date of Birth/Sex: 12-Dec-1950 (68 y.o. M) Treating RN: Montey Hora Primary Care Provider: SYSTEM, PCP Other Clinician: Referring Provider: Referral, Self Treating Provider/Extender: STONE III, Emin Foree Weeks in Treatment: 14 Diagnosis Coding ICD-10 Codes Code Description E11.622 Type 2 diabetes mellitus with other skin ulcer I89.0 Lymphedema, not elsewhere classified I87.2 Venous insufficiency (chronic) (peripheral) L97.822 Non-pressure chronic ulcer of other part of left lower leg with fat layer exposed I10 Essential (primary) hypertension Facility Procedures CPT4 Code: [id number] Description: (Facility Use Only) [phone] - APPLY [place] LWR LT LEG Modifier: Quantity: 1 Physician Procedures CPT4 Code Description: [id number] - WC PHYS LEVEL 4 - EST PT ICD-10 Diagnosis Description E11.622 Type 2 diabetes mellitus with other skin ulcer I89.0 Lymphedema, not elsewhere classified I87.2 Venous insufficiency (chronic) (peripheral) L97.822  Non-pressure chronic ulcer of other part of left lower leg wit Modifier: h fat layer expos Quantity: 1 ed Electronic Signature(s) Signed: 02/20/2019 6:12:50 PM By: Worthy Keeler PA-C Previous Signature: 02/19/2019 5:32:28 PM Version By: Montey Hora Entered By: Worthy Keeler on 02/20/2019 13:13:44

## 2019-02-22 ENCOUNTER — Ambulatory Visit: Payer: PPO

## 2019-02-23 LAB — AEROBIC CULTURE? (SUPERFICIAL SPECIMEN): Gram Stain: NONE SEEN

## 2019-02-23 LAB — AEROBIC CULTURE W GRAM STAIN (SUPERFICIAL SPECIMEN)

## 2019-02-26 ENCOUNTER — Encounter: Payer: PPO | Admitting: Physician Assistant

## 2019-02-26 ENCOUNTER — Other Ambulatory Visit: Payer: Self-pay

## 2019-02-26 DIAGNOSIS — L97829 Non-pressure chronic ulcer of other part of left lower leg with unspecified severity: Secondary | ICD-10-CM | POA: Diagnosis not present

## 2019-02-26 DIAGNOSIS — L97222 Non-pressure chronic ulcer of left calf with fat layer exposed: Secondary | ICD-10-CM | POA: Diagnosis not present

## 2019-02-26 DIAGNOSIS — E11622 Type 2 diabetes mellitus with other skin ulcer: Secondary | ICD-10-CM | POA: Diagnosis not present

## 2019-02-26 NOTE — Progress Notes (Signed)
RAMIL, FOIL ([id number]) Visit Report for 02/26/2019 Arrival Information Details Patient Name: [name], [name] Date of Service: 02/26/2019 1:00 PM Medical Record Number: [id number] Patient Account Number: [id number] Date of Birth/Sex: 11/04/1950 (68 y.o. M) Treating RN: Montey Hora Primary Care Anfernee Peschke: SYSTEM, PCP Other Clinician: Referring Rani Sisney: Referral, Self Treating Natha Guin/Extender: STONE III, [name] Weeks in Treatment: 15 Visit Information History Since Last Visit Added or deleted any medications: No Patient Arrived: Ambulatory Any new allergies or adverse reactions: No Arrival Time: 12:58 Had a fall or experienced change in No Accompanied By: self activities of daily living that may affect Transfer Assistance: None risk of falls: Patient Identification Verified: Yes Signs or symptoms of abuse/neglect since last visito No Secondary Verification Process Completed: Yes Hospitalized since last visit: No Patient Has Alerts: Yes Implantable device outside of the clinic excluding No Patient Alerts: DMII cellular tissue based products placed in the center since last visit: Has Dressing in Place as Prescribed: Yes Pain Present Now: No Electronic Signature(s) Signed: 02/26/2019 2:42:16 PM By: Lorine Bears RCP, RRT, CHT Entered By: Lorine Bears on 02/26/2019 12:58:44 [name] ([id number]) -------------------------------------------------------------------------------- Compression Therapy Details Patient Name: [name] Date of Service: 02/26/2019 1:00 PM Medical Record Number: [id number] Patient Account Number: [id number] Date of Birth/Sex: 06/23/1950 (68 y.o. M) Treating RN: Montey Hora Primary Care Malin Sambrano: SYSTEM, PCP Other Clinician: Referring Rameses Ou: Referral, Self Treating Ariday Brinker/Extender: STONE III, [name] Weeks in Treatment: 15 Compression Therapy Performed for Wound Assessment: Wound #8 Left,Posterior Lower  Leg Performed By: Clinician Montey Hora, RN Compression Type: Three Layer Pre Treatment ABI: 1.5 Post Procedure Diagnosis Same as Pre-procedure Electronic Signature(s) Signed: 02/26/2019 4:44:40 PM By: Montey Hora Entered By: Montey Hora on 02/26/2019 13:33:13 [name] ([id number]) -------------------------------------------------------------------------------- Encounter Discharge Information Details Patient Name: [name] Date of Service: 02/26/2019 1:00 PM Medical Record Number: [id number] Patient Account Number: [id number] Date of Birth/Sex: 10-16-1950 (68 y.o. M) Treating RN: Montey Hora Primary Care Soniyah Mcglory: SYSTEM, PCP Other Clinician: Referring Shervon Kerwin: Referral, Self Treating Barbara Keng/Extender: STONE III, [name] Weeks in Treatment: 15 Encounter Discharge Information Items Discharge Condition: Stable Ambulatory Status: Ambulatory Discharge Destination: Home Transportation: Private Auto Accompanied By: self Schedule Follow-up Appointment: Yes Clinical Summary of Care: Electronic Signature(s) Signed: 02/26/2019 1:41:08 PM By: Montey Hora Entered By: Montey Hora on 02/26/2019 13:41:08 [name] ([id number]) -------------------------------------------------------------------------------- Lower Extremity Assessment Details Patient Name: [name] Date of Service: 02/26/2019 1:00 PM Medical Record Number: [id number] Patient Account Number: [id number] Date of Birth/Sex: 07/19/1950 (68 y.o. M) Treating RN: Harold Barban Primary Care Arasely Akkerman: SYSTEM, PCP Other Clinician: Referring Anaiyah Anglemyer: Referral, Self Treating Timera Windt/Extender: STONE III, [name] Weeks in Treatment: 15 Edema Assessment Assessed: [Left: No] [Right: No] [Left: Edema] [Right: :] Calf Left: Right: Point of Measurement: 35 cm From Medial Instep 42.8 cm cm Ankle Left: Right: Point of Measurement: 11 cm From Medial Instep 32.7 cm cm Vascular Assessment Pulses: Dorsalis  Pedis Palpable: [Left:No] Posterior Tibial Palpable: [Left:Yes] Electronic Signature(s) Signed: 02/26/2019 4:21:49 PM By: Harold Barban Entered By: Harold Barban on 02/26/2019 13:13:19 [name] ([id number]) -------------------------------------------------------------------------------- Multi Wound Chart Details Patient Name: [name] Date of Service: 02/26/2019 1:00 PM Medical Record Number: [id number] Patient Account Number: [id number] Date of Birth/Sex: 1950-10-31 (68 y.o. 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 N/A Lower Extremity Comorbid History: Hypertension, Peripheral Hypertension, Peripheral N/A Venous Disease, Type II Venous Disease, Type II Diabetes, Neuropathy, Diabetes, Neuropathy, Received Chemotherapy Received Chemotherapy Date Acquired: 01/01/2019 02/10/2019 N/A Weeks of Treatment: 6 2 N/A Wound Status: Open Healed - Epithelialized N/A Clustered Wound: No Yes N/A Clustered Quantity: N/A 2 N/A Measurements L x W x D 13.4x6.2x0.1 0x0x0 N/A (cm) Area (cm) : 65.251 0 N/A Volume (cm) : 6.525 0 N/A % Reduction in Area: -2343.90% 100.00% N/A % Reduction in Volume: -2343.80% 100.00% N/A Classification: Grade 1 Full Thickness Without N/A Exposed Support Structures Exudate Amount: Large None Present N/A Exudate Type: Serous N/A N/A Exudate Color: amber N/A N/A Wound Margin: Flat and Intact Flat and Intact N/A Granulation Amount:  Large (67-100%) Large (67-100%) N/A Granulation Quality: Red Red N/A Necrotic Amount: Small (1-33%) Small (1-33%) N/A TEOFIL, SKEANS ([id number]) Necrotic Tissue: Adherent Slough Eschar N/A Exposed Structures: Fat Layer (Subcutaneous Fascia: No N/A Tissue) Exposed: Yes Fat Layer (Subcutaneous Fascia: No Tissue) Exposed: No Tendon: No Tendon: No Muscle: No Muscle: No Joint: No Joint: No Bone: No Bone: No Epithelialization: None Large (67-100%) N/A Treatment Notes Electronic Signature(s) Signed: 02/26/2019 4:44:40 PM By: Montey Hora Entered By: Montey Hora on 02/26/2019 13:31:26 [name] ([id number]) -------------------------------------------------------------------------------- [place] Details Patient Name: [name] Date of Service: 02/26/2019 1:00 PM Medical Record Number: [id number] Patient Account Number: [id number] Date of Birth/Sex: [birth year] (68 y.o. M) Treating RN: Montey Hora Primary Care Marymargaret Kirker: SYSTEM, PCP Other Clinician: Referring Josph Norfleet: Referral, Self Treating Dannie Woolen/Extender: STONE III, [name] Weeks in Treatment: 15 Active Inactive Venous Leg Ulcer Nursing Diagnoses: Actual venous Insuffiency (use after diagnosis is confirmed) Knowledge deficit related to disease process and management Goals: Patient will maintain optimal edema control Date Initiated: 11/12/2018 Target Resolution Date: 03/08/2019 Goal Status: Active Patient/caregiver will verbalize understanding of disease process and disease management Date Initiated: 11/12/2018 Target Resolution Date: 03/08/2019 Goal Status: Active Interventions: Assess peripheral edema status every visit. Compression as ordered Provide education on venous insufficiency Notes: Wound/Skin Impairment Nursing Diagnoses: Impaired tissue integrity Knowledge deficit related to ulceration/compromised skin integrity Goals: Ulcer/skin breakdown will have a volume reduction of 30%  by week 4 Date Initiated: 11/12/2018 Target Resolution Date: 03/08/2019 Goal Status: Active Interventions: Assess patient/caregiver ability to obtain necessary supplies Assess patient/caregiver ability to perform ulcer/skin care regimen upon admission and as needed Assess ulceration(s) every visit Provide education on ulcer and skin care Notes: JASMON, SETTLE ([id number]) Electronic Signature(s) Signed: 02/26/2019 4:44:40 PM By: Montey Hora Entered By: Montey Hora on 02/26/2019 13:29:24 [name] ([id number]) -------------------------------------------------------------------------------- Pain Assessment Details Patient Name: [name] Date of Service: 02/26/2019 1:00 PM Medical Record Number: [id number] Patient Account Number: [id number] Date of Birth/Sex: 1950/10/22 (68 y.o. M) Treating RN: Montey Hora Primary Care Sung Parodi: SYSTEM, PCP Other Clinician: Referring Keyry Iracheta: Referral, Self Treating Mickie Badders/Extender: STONE III, [name] Weeks in Treatment: 15 Active Problems Location of Pain Severity and Description of Pain Patient Has Paino No Site Locations Pain Management and Medication Current Pain Management: Electronic Signature(s) Signed: 02/26/2019 2:42:16 PM By: Lorine Bears RCP, RRT, CHT Signed: 02/26/2019 4:44:40 PM By: Montey Hora Entered By: Lorine Bears on 02/26/2019 12:59:31 [name] ([id number]) -------------------------------------------------------------------------------- Patient/Caregiver Education Details Patient Name: [name] Date of Service: 02/26/2019 1:00 PM Medical Record Number: [id number] Patient Account Number: [id number] Date of Birth/Gender: 1951-01-14 (68 y.o. M) Treating  RN: Montey Hora Primary Care Physician: SYSTEM, PCP Other Clinician: Referring Physician: Referral, Self Treating Physician/Extender: Melburn Hake, [name] Weeks in Treatment: 15 Education Assessment Education Provided  To: Patient Education Topics Provided Venous: Handouts: Other: need for ongoing compression Methods: Explain/Verbal Responses: State content correctly Electronic Signature(s) Signed: 02/26/2019 4:44:40 PM By: Montey Hora Entered By: Montey Hora on 02/26/2019 13:40:18 [name] ([id number]) -------------------------------------------------------------------------------- Wound Assessment Details Patient Name: [name] Date of Service: 02/26/2019 1:00 PM Medical Record Number: [id number] Patient Account Number: [id number] Date of Birth/Sex: 1950-06-13 (68 y.o. M) Treating RN: Harold Barban Primary Care Leonidas Boateng: SYSTEM, PCP Other Clinician: Referring Abie Killian: Referral, Self Treating Shakelia Scrivner/Extender: STONE III, [name] Weeks in Treatment: 15 Wound Status Wound Number: 8 Primary Diabetic Wound/Ulcer of the Lower Extremity Etiology: Wound Location: Left Lower Leg - Posterior Wound Open Wounding Event: Blister Status: Date Acquired: 01/01/2019 Comorbid Hypertension, Peripheral Venous Disease, Type Weeks Of Treatment: 6 History: II Diabetes, Neuropathy, Received Clustered Wound: No Chemotherapy Photos Wound Measurements Length: (cm) 13.4 % Reduction i Width: (cm) 6.2 % Reduction i Depth: (cm) 0.1 Epithelializa Area: (cm) 65.251 Tunneling: Volume: (cm) 6.525 Undermining: n Area: -2343.9% n Volume: -2343.8% tion: None No No Wound Description Classification: Grade 1 Foul Odor Aft Wound Margin: Flat and Intact Slough/Fibrin Exudate Amount: Large Exudate Type: Serous Exudate Color: amber er Cleansing: No o Yes Wound Bed Granulation Amount: Large (67-100%) Exposed Structure Granulation Quality: Red Fascia Exposed: No Necrotic Amount: Small (1-33%) Fat Layer (Subcutaneous Tissue) Exposed: Yes Necrotic Quality: Adherent Slough Tendon Exposed: No Muscle Exposed: No Joint Exposed: No Bone Exposed: No Treatment Notes SABASTAIN, SIDOR ([id number]) Wound #8  (Left, Posterior Lower Leg) Notes adaptic, silvercel, xtrasorb, 3 layer wrap with unna to anchor Electronic Signature(s) Signed: 02/26/2019 4:21:49 PM By: Harold Barban Entered By: Harold Barban on 02/26/2019 13:11:51 [name] ([id number]) -------------------------------------------------------------------------------- Wound Assessment Details Patient Name: [name] Date of Service: 02/26/2019 1:00 PM Medical Record Number: [id number] Patient Account Number: [id number] Date of Birth/Sex: 1951-04-02 (68 y.o. M) Treating RN: Montey Hora Primary Care Kaeden Mester: SYSTEM, PCP Other Clinician: Referring Maude Hettich: Referral, Self Treating Rece Zechman/Extender: STONE III, [name] Weeks in Treatment: 15 Wound Status Wound Number: 9 Primary Venous Leg Ulcer Etiology: Wound Location: Left, Midline, Anterior Lower Leg Wound Healed - Epithelialized Wounding Event: Gradually Appeared Status: Date Acquired: 02/10/2019 Comorbid Hypertension, Peripheral Venous Disease, Type Weeks Of Treatment: 2 History: II Diabetes, Neuropathy, Received Clustered Wound: Yes Chemotherapy Photos Wound Measurements Length: (cm) 0 % R Width: (cm) 0 % R Depth: (cm) 0 Epi Clustered Quantity: 2 Tun Area: (cm) 0 Un Volume: (cm) 0 eduction in Area: 100% eduction in Volume: 100% thelialization: Large (67-100%) neling: No dermining: No Wound Description Full Thickness Without Exposed Support Classification: Structures Wound Margin: Flat and Intact Exudate None Present Amount: Foul Odor After Cleansing: No Slough/Fibrino No Wound Bed Granulation Amount: Large (67-100%) Exposed Structure Granulation Quality: Red Fascia Exposed: No Necrotic Amount: Small (1-33%) Fat Layer (Subcutaneous Tissue) Exposed: No Necrotic Quality: Eschar Tendon Exposed: No Muscle Exposed: No Joint Exposed: No Bone Exposed: No ROMUALDO, CARION ([id number]) Electronic Signature(s) Signed: 02/26/2019 4:44:40 PM By:  Montey Hora Entered By: Montey Hora on 02/26/2019 13:31:17 [name] ([id number]) -------------------------------------------------------------------------------- Vitals Details Patient Name: [name] Date of Service: 02/26/2019 1:00 PM Medical Record Number: [id number] Patient Account Number: [id number] Date of Birth/Sex: Sep 30, 1950 (68 y.o. M) Treating RN: Montey Hora Primary Care Tamerra Merkley: SYSTEM, PCP Other Clinician: Referring Izael Bessinger: Referral, Self Treating Nysha Koplin/Extender: STONE III, [name] Weeks in Treatment: 15 Vital Signs Time Taken:  12:58 Temperature (F): 98.6 Height (in): 73 Pulse (bpm): 76 Weight (lbs): 300 Respiratory Rate (breaths/min): 16 Body Mass Index (BMI): 39.6 Blood Pressure (mmHg): 121/60 Reference Range: 80 - 120 mg / dl Electronic Signature(s) Signed: 02/26/2019 2:42:16 PM By: Lorine Bears RCP, RRT, CHT Entered By: Lorine Bears on 02/26/2019 12:59:56

## 2019-02-26 NOTE — Progress Notes (Addendum)
HARLO, HAMMERS ([id number]) Visit Report for 02/26/2019 Chief Complaint Document Details Patient Name: [name], [name] Date of Service: 02/26/2019 1:00 PM Medical Record Number: [id number] Patient Account Number: [id number] Date of Birth/Sex: 1950/05/14 (68 y.o. M) Treating RN: Montey Hora Primary Care Provider: SYSTEM, PCP Other Clinician: Referring Provider: Referral, Self Treating Provider/Extender: Melburn Hake, [name] Weeks in Treatment: 15 Information Obtained from: Patient Chief Complaint Left LE ulcers Electronic Signature(s) Signed: 02/26/2019 1:22:49 PM By: Worthy Keeler PA-C Entered By: Worthy Keeler on 02/26/2019 13:22:49 [name] ([id number]) -------------------------------------------------------------------------------- HPI Details Patient Name: [name] Date of Service: 02/26/2019 1:00 PM Medical Record Number: [id number] Patient Account Number: [id number] Date of Birth/Sex: [birth year] (68 y.o. M) Treating RN: Montey Hora Primary Care Provider: SYSTEM, PCP Other Clinician: Referring Provider: Referral, Self Treating Provider/Extender: STONE III, [name] Weeks in Treatment: 15 History of Present Illness HPI Description: 68 year old male who presented to the ER with bilateral lower extremity blisters which had started last week. he has a past medical history of leukemia, diabetes mellitus, hypertension, edema of both lower extremities, his recurrent skin infections, peripheral vascular disease, coronary artery disease, congestive heart failure and peripheral neuropathy. in the ER he was given Rocephin and put on Silvadene cream. he was put on oral doxycycline and was asked to follow-up with the [facility]. His last hemoglobin A1c was 6.6 in December and he checks his blood sugar once a week. He does not have any physicians outside the [place] system. He does not recall any vascular duplex studies done either for arterial or venous disease but was told to wear  compression stockings which he does not use 05/30/2016 -- we have not yet received any of his notes from the [facility] hospital system and his arterial and venous duplex studies are scheduled here in [place] around mid February. We are unable to have his insurance accepted by home health agencies and hence he is getting dressings only once a week. 06/06/16 -- -- I received a call from the patient's PCP at the [facility] at [facility] and spoke to Dr. Garvin Fila, phone number [phone] and fax number [phone]. She confirmed that no vascular testing was done over the last 5 years and she would be happy to do them if the patient did want them to be done at the [place] and we could fax him a request. Readmission: 68 year old male seen by as in February of this year and was referred to vein and vascular for studies and opinion from the vascular surgeons. The patient returns today with a fresh problem having had blisters on his left lower extremity which have been there for about 5 days and he clearly states that he has been wearing his compression stockings as advised though he could not read the moderate compression and has been wearing light compression. Review of his electronic medical records note that he had lower extremity arterial duplex examination done on 06/23/2016 which showed no hemodynamically significant stenosis in the bilateral lower extremity arterial system. He also had a lower extremity venous reflux examination done on 07/07/2016 and it was noted that he had venous incompetence in the right great saphenous vein and bilateral common femoral veins. Patient was seen by Dr. Tamala Julian on the same day and for some reason his notes do not reflect the venous studies or the arterial studies and he recommended patient do a venous duplex ultrasound to look for reflux and return to see him.he would also consider a lymph pump if required. The patient was told that  his workup was normal and hence the  patient canceled his follow-up appointment. 02/03/17 on evaluation today patient left medial lower extremity blister appears to be doing about the same. It is still continuing to drain and there's still the blistered skin covering the wound bed which is making it difficult for the alternate to do its job. Fortunately there is no evidence of cellulitis. No fevers chills noted. Patient states in general he is not having any significant discomfort. Patient's lower extremity arterial duplex exam revealed that patient was hemodynamically stable with no evidence of stenosis in regard to the bilateral lower extremities. The lower extremity venous reflux exam revealed the patient had venous incontinence noted in the right greater saphenous and bilateral common femoral vein. There is no evidence of deep or superficial vein thrombosis in the bilateral lower extremities. Readmission: [name], [name] ([id number]) 11/12/18 Patient presents for evaluation our clinic today concerning issues that he is having with his left lower extremity. He tells me that a couple weeks ago he began developing blisters on the left lower extremity along with increased swelling. He typically wears his compression stockings on a regular basis is previously been evaluated both here as well is with vascular surgery they would recommend lymphedema pumps but unfortunately that somehow fell through and he never heard anything back from that. Nonetheless I think lymphedema pumps would be beneficial for this patient. He does have a history of hypertension and diabetes. Obviously the chronic venous stasis and lymphedema as well. At this point the blisters have been given in more trouble he states sometimes when the blisters openings able to clean it down with alcohol and it will dry out and do well. Unfortunately that has not been the case this time. He is having some discomfort although this mean these with cleaning the areas he doesn't have  discomfort just on a regular basis. He has not been able to wear his compression stockings since the blisters arose due to the fact that of course it will drain into the socks causing additional issues and he didn't have any way to wrap this otherwise. He has increased to taking his Lasix every day instead of every other day. He sees his primary care provider later this month as well. No fevers, chills, nausea, or vomiting noted at this time. 11/19/18-Patient returns at 1 week, per intake RN the amount of seepage into the compression wraps was definitely improved, overall all the wounds are measuring smaller but continuing silver alginate to the wounds as primary dressing 11/26/18 on evaluation today patient appears to be doing quite well in regard to his left lower [place] ulcers. In fact of the areas that were noted initially he only has two regions still open. There is no evidence of active infection at this time. He still is not heard anything from the company regarding lymphedema pumps as of yet. Again as previously seen vascular they have not recommended any surgical intervention. 12/03/2018 on evaluation today patient actually appears to be doing quite well with regard to his lower extremity ulcers. In fact most of the areas appear to be healed the one spot which does not seem to be completely healed I am unsure of whether or not this is really draining that much but nonetheless there does not appear to be any signs of infection or significant drainage at this point. There is no sign of fever, chills, nausea, vomiting, or diarrhea. Overall I am pleased with how things have progressed I think is very close  to being able to transition to his home compression stockings. 12/10/2018 upon evaluation today patient appears to be doing quite well with regard to his left lower extremity. He has been tolerating the dressing changes without complication. Fortunately there is no signs of active infection at  this time. He appears after thorough evaluation of his leg to only have 1 small area that remains open at this point everything else appears to be almost completely closed. He still have significant swelling of the left lower extremity. We had discussed discussing this with his primary care provider he is not able to see her in person they were at the [facility] and right now the [place] is not seeing patients on site. According to the patient anyway. Subsequently he did speak with her apparently and his primary care provider feels that he may likely have a DVT. With that being said she has not seen his leg she is just going off of his history. Nonetheless that is a concern that the patient now has as well and while I do not feel the DVT is likely we can definitely ensure that that is not the case I will go ahead and see about putting that order in today. Nonetheless otherwise I am in a recommend that we continue with the current wound care measures including the compression therapy most likely. We just need to ensure that his leg is indeed free of any DVTs. 12/17/2018 on evaluation today patient actually appears to be completely healed today. He does have 2 very small areas of blistering although this is not anything too significant at this point which is good news. With that being said I am in agreement with the fact that I think he is completely healed at this point. He does want to get back into his compression stocking. The good news is we have gotten approval from insurance for his lymphedema pumps we received a letter since last saw him last week. The other good news is his study did come back and showed no evidence of a DVT. 12/20/2018 on evaluation today patient presents for follow-up concerning his ongoing issues with his left lower extremity. He was actually discharged last Friday and did fairly well until he states blisters opened this morning. He tells me he has been wearing his compression  stocking although he has a hard time getting this on. There does not appear to be any signs of active infection at this time. No fevers, chills, nausea, vomiting, or diarrhea. 12/27/2018 on evaluation today patient appears to be doing very well with regard to his swelling of the left lower extremity the 4 layer compression wrap seems to have been beneficial for him. Fortunately there is no signs of active infection at this time. Patient has been tolerating the compression wrap without complication and his foot swelling in particular appears to be greatly improved. He does still have a wound on the lateral portion of his left leg I believe this is more of a blister that has now reopened. 01/03/2019 on evaluation today patient actually appears to be doing excellent in regard to his left lower extremity. He did receive his compression pumps and is actually use this 7 times since he was last here in the office. On top of the compression wrap he is now roughly 3 cm better at the calf and 2 cm better at the ankle he also states that his foot seem to go an issue better without even having to use a shoe horn.  Obviously I think this is all evidence that he is doing excellent in this regard. The other good news is he does not appear to have anything open today as far as wounds are concerned. [name], [name] ([id number]) 01/15/2019 on evaluation today patient appears to be doing more poorly yet again with regard to his left lower extremity. He has developed new wounds again after being discharged just recently. Unfortunately this continues to be the case that he will heal and then have subsequent new wounds. The last time I was hopeful that he may not end up coming back too quickly especially since he states he has been using his lymphedema pumps along with wearing his compression. Nonetheless he had a blister on the back of his leg that popped up on the left and this has opened up into an ulceration it is quite  painful. 01/22/19 on evaluation today patient actually appears to be doing well with regard to his wound on the left lower extremity. He's been tolerating the dressing changes without complication including the compression wrap in the wound appears to be significantly smaller today which is great news. Overall very pleased in this regard. 01/29/2019 on evaluation today patient appears to be doing well with regard to his left posterior lower extremity ulcer. He has been tolerating the dressing changes without complication. This is not completely healed but is getting much closer. We did order a Farrow wrap 4000 for him he has received this and has it with him today although I am not sure we are quite ready to start him on that as of yet. We are very close. 02/05/2019 on evaluation today patient actually appears to be doing quite well with regard to his left posterior lower extremity ulcer. He still has a very tiny opening remaining but the fortunate thing is he seems to be healing quite nicely. He also did get his Farrow wrap which I am hoping will help with his edema control as well at home. Fortunately there is no evidence of active infection. 02/12/2019 patient and fortunately appears to be doing poorly in regard to his wounds of the left lower extremity. He was very close to healing therefore we attempted to use his Velcro compression wraps continuing with lymphedema pumps at home. Unfortunately that does not seem to have done very well for him. He tells me that he wore them all the time but again I am not sure why if that is the case that he is having such significant edema. He is still on his fluid pills as well. With that being said there is no obvious sign of infection although I do wonder about the possibility of infection at this time as well. 02/19/2019 unfortunately upon evaluation today patient appears to be doing more poorly with regard to his left lower extremity. He is not showing signs  of significant improvement and I think the biggest issue here is that he does have an infection that appears to likely be Pseudomonas. That is based on the blue-green drainage that were noted at this time. Unfortunately the antibiotic that has been on is not going to take care of this at all. I think they will get a need to switch him to either Levaquin or Cipro and this was discussed with the patient. 02/26/2019 on evaluation today patient's lower extremity on the left appears to be doing significantly better as compared to last evaluation. Fortunately there is no signs of active infection at this time. He has been tolerating the  compression wrap without complication in fact he made it the whole week at this point. He is showing signs of excellent improvement I am very happy in this regard. With that being said he is having some issues with infection we did review the results of his culture which I noted today. He did have a positive finding for Enterobacter as well as Alcaligenes faecalis. Fortunately the Levaquin that I placed him on will work for both which is great news. There is no signs of systemic infection at this point. Electronic Signature(s) Signed: 02/26/2019 1:42:24 PM By: Worthy Keeler PA-C Entered By: Worthy Keeler on 02/26/2019 13:42:24 [name] ([id number]) -------------------------------------------------------------------------------- Physical Exam Details Patient Name: [name] Date of Service: 02/26/2019 1:00 PM Medical Record Number: [id number] Patient Account Number: [id number] Date of Birth/Sex: [birth year] (68 y.o. M) Treating RN: Montey Hora Primary Care Provider: SYSTEM, PCP Other Clinician: Referring Provider: Referral, Self Treating Provider/Extender: STONE III, [name] Weeks in Treatment: 15 Constitutional Obese and well-hydrated in no acute distress. Respiratory normal breathing without difficulty. clear to auscultation  bilaterally. Cardiovascular regular rate and rhythm with normal S1, S2. Psychiatric this patient is able to make decisions and demonstrates good insight into disease process. Alert and Oriented x 3. pleasant and cooperative. Notes Patient's wound bed currently is showing signs of good epithelialization at this point the results of his use of the Adaptic show excellent improvement as far as the dressings are concerned nothing is sticking badly to the wound bed which is good news. Fortunately there is no evidence of active infection at this time systemically although locally the infection is resolving as well. Overall I am very pleased in this regard. He still has significant swelling of the left lower extremity. Electronic Signature(s) Signed: 02/26/2019 1:42:59 PM By: Worthy Keeler PA-C Entered By: Worthy Keeler on 02/26/2019 13:42:59 [name] ([id number]) -------------------------------------------------------------------------------- Physician Orders Details Patient Name: [name] Date of Service: 02/26/2019 1:00 PM Medical Record Number: [id number] Patient Account Number: [id number] Date of Birth/Sex: 01/18/1951 (68 y.o. M) Treating RN: Montey Hora Primary Care Provider: SYSTEM, PCP Other Clinician: Referring Provider: Referral, Self Treating Provider/Extender: STONE III, [name] Weeks in Treatment: 15 Verbal / Phone Orders: No Diagnosis Coding ICD-10 Coding Code Description E11.622 Type 2 diabetes mellitus with other skin ulcer I89.0 Lymphedema, not elsewhere classified I87.2 Venous insufficiency (chronic) (peripheral) L97.822 Non-pressure chronic ulcer of other part of left lower leg with fat layer exposed I10 Essential (primary) hypertension Wound Cleansing Wound #8 Left,Posterior Lower Leg o May shower with protection. - Please do not get your wrap wet Primary Wound Dressing Wound #8 Left,Posterior Lower Leg o Silver Alginate o Other: - adaptic contact  layer Secondary Dressing Wound #8 Left,Posterior Lower Leg o ABD pad o XtraSorb Dressing Change Frequency Wound #8 Left,Posterior Lower Leg o Other: - Twice Weekly - Tuesdays and Fridays Follow-up Appointments Wound #8 Left,Posterior Lower Leg o Return Appointment in 1 week. - Friday 03/08/19 o Nurse Visit as needed - Friday 03/01/19 and Tuesday 03/05/19 Edema Control Wound #8 Left,Posterior Lower Leg o 3 Layer Compression System - Left Lower Extremity Consults o Vascular - [place] for Consult for Venous Insufficiency Electronic Signature(s) KAIDIN, RUSHLOW ([id number]) Signed: 02/26/2019 4:44:40 PM By: Montey Hora Signed: 02/26/2019 7:13:08 PM By: Worthy Keeler PA-C Entered By: Montey Hora on 02/26/2019 13:37:43 [name] ([id number]) -------------------------------------------------------------------------------- Problem List Details Patient Name: [name] Date of Service: 02/26/2019 1:00 PM Medical Record Number: [id number] Patient Account Number: [id number] Date of Birth/Sex: 1950/11/16 (68  y.o. M) Treating RN: Montey Hora Primary Care Provider: SYSTEM, PCP Other Clinician: Referring Provider: Referral, Self Treating Provider/Extender: Melburn Hake, [name] Weeks in Treatment: 15 Active Problems ICD-10 Evaluated Encounter Code Description Active Date Today Diagnosis E11.622 Type 2 diabetes mellitus with other skin ulcer 11/12/2018 No Yes I89.0 Lymphedema, not elsewhere classified 11/12/2018 No Yes I87.2 Venous insufficiency (chronic) (peripheral) 11/12/2018 No Yes L97.822 Non-pressure chronic ulcer of other part of left lower leg with 11/12/2018 No Yes fat layer exposed I10 Essential (primary) hypertension 11/12/2018 No Yes Inactive Problems Resolved Problems Electronic Signature(s) Signed: 02/26/2019 1:22:39 PM By: Worthy Keeler PA-C Entered By: Worthy Keeler on 02/26/2019 13:22:39 [name]  ([id number]) -------------------------------------------------------------------------------- Progress Note Details Patient Name: [name] Date of Service: 02/26/2019 1:00 PM Medical Record Number: [id number] Patient Account Number: [id number] Date of Birth/Sex: 1951-04-02 (68 y.o. M) Treating RN: Montey Hora Primary Care Provider: SYSTEM, PCP Other Clinician: Referring Provider: Referral, Self Treating Provider/Extender: STONE III, [name] Weeks in Treatment: 15 Subjective Chief Complaint Information obtained from Patient Left LE ulcers History of Present Illness (HPI) 68 year old male who presented to the ER with bilateral lower extremity blisters which had started last week. he has a past medical history of leukemia, diabetes mellitus, hypertension, edema of both lower extremities, his recurrent skin infections, peripheral vascular disease, coronary artery disease, congestive heart failure and peripheral neuropathy. in the ER he was given Rocephin and put on Silvadene cream. he was put on oral doxycycline and was asked to follow-up with the [facility]. His last hemoglobin A1c was 6.6 in December and he checks his blood sugar once a week. He does not have any physicians outside the [place] system. He does not recall any vascular duplex studies done either for arterial or venous disease but was told to wear compression stockings which he does not use 05/30/2016 -- we have not yet received any of his notes from the [facility] hospital system and his arterial and venous duplex studies are scheduled here in [place] around mid February. We are unable to have his insurance accepted by home health agencies and hence he is getting dressings only once a week. 06/06/16 -- -- I received a call from the patient's PCP at the [facility] at [facility] and spoke to Dr. Garvin Fila, phone number [phone] and fax number [phone]. She confirmed that no vascular testing was done over the last 5 years and she would  be happy to do them if the patient did want them to be done at the [place] and we could fax him a request. Readmission: 68 year old male seen by as in February of this year and was referred to vein and vascular for studies and opinion from the vascular surgeons. The patient returns today with a fresh problem having had blisters on his left lower extremity which have been there for about 5 days and he clearly states that he has been wearing his compression stockings as advised though he could not read the moderate compression and has been wearing light compression. Review of his electronic medical records note that he had lower extremity arterial duplex examination done on 06/23/2016 which showed no hemodynamically significant stenosis in the bilateral lower extremity arterial system. He also had a lower extremity venous reflux examination done on 07/07/2016 and it was noted that he had venous incompetence in the right great saphenous vein and bilateral common femoral veins. Patient was seen by Dr. Tamala Julian on the same day and for some reason his notes do not reflect the venous  studies or the arterial studies and he recommended patient do a venous duplex ultrasound to look for reflux and return to see him.he would also consider a lymph pump if required. The patient was told that his workup was normal and hence the patient canceled his follow-up appointment. 02/03/17 on evaluation today patient left medial lower extremity blister appears to be doing about the same. It is still continuing to drain and there's still the blistered skin covering the wound bed which is making it difficult for the alternate to do its job. Fortunately there is no evidence of cellulitis. No fevers chills noted. Patient states in general he is not having any significant discomfort. Patient's lower extremity arterial duplex exam revealed that patient was hemodynamically stable with no evidence of stenosis in regard to the bilateral  lower extremities. The lower extremity venous reflux exam revealed the patient had venous incontinence noted in the right greater saphenous [place], [name] ([id number]) and bilateral common femoral vein. There is no evidence of deep or superficial vein thrombosis in the bilateral lower extremities. Readmission: 11/12/18 Patient presents for evaluation our clinic today concerning issues that he is having with his left lower extremity. He tells me that a couple weeks ago he began developing blisters on the left lower extremity along with increased swelling. He typically wears his compression stockings on a regular basis is previously been evaluated both here as well is with vascular surgery they would recommend lymphedema pumps but unfortunately that somehow fell through and he never heard anything back from that. Nonetheless I think lymphedema pumps would be beneficial for this patient. He does have a history of hypertension and diabetes. Obviously the chronic venous stasis and lymphedema as well. At this point the blisters have been given in more trouble he states sometimes when the blisters openings able to clean it down with alcohol and it will dry out and do well. Unfortunately that has not been the case this time. He is having some discomfort although this mean these with cleaning the areas he doesn't have discomfort just on a regular basis. He has not been able to wear his compression stockings since the blisters arose due to the fact that of course it will drain into the socks causing additional issues and he didn't have any way to wrap this otherwise. He has increased to taking his Lasix every day instead of every other day. He sees his primary care provider later this month as well. No fevers, chills, nausea, or vomiting noted at this time. 11/19/18-Patient returns at 1 week, per intake RN the amount of seepage into the compression wraps was definitely improved, overall all the wounds are  measuring smaller but continuing silver alginate to the wounds as primary dressing 11/26/18 on evaluation today patient appears to be doing quite well in regard to his left lower [place] ulcers. In fact of the areas that were noted initially he only has two regions still open. There is no evidence of active infection at this time. He still is not heard anything from the company regarding lymphedema pumps as of yet. Again as previously seen vascular they have not recommended any surgical intervention. 12/03/2018 on evaluation today patient actually appears to be doing quite well with regard to his lower extremity ulcers. In fact most of the areas appear to be healed the one spot which does not seem to be completely healed I am unsure of whether or not this is really draining that much but nonetheless there does not appear to  be any signs of infection or significant drainage at this point. There is no sign of fever, chills, nausea, vomiting, or diarrhea. Overall I am pleased with how things have progressed I think is very close to being able to transition to his home compression stockings. 12/10/2018 upon evaluation today patient appears to be doing quite well with regard to his left lower extremity. He has been tolerating the dressing changes without complication. Fortunately there is no signs of active infection at this time. He appears after thorough evaluation of his leg to only have 1 small area that remains open at this point everything else appears to be almost completely closed. He still have significant swelling of the left lower extremity. We had discussed discussing this with his primary care provider he is not able to see her in person they were at the [facility] and right now the [place] is not seeing patients on site. According to the patient anyway. Subsequently he did speak with her apparently and his primary care provider feels that he may likely have a DVT. With that being said she has not  seen his leg she is just going off of his history. Nonetheless that is a concern that the patient now has as well and while I do not feel the DVT is likely we can definitely ensure that that is not the case I will go ahead and see about putting that order in today. Nonetheless otherwise I am in a recommend that we continue with the current wound care measures including the compression therapy most likely. We just need to ensure that his leg is indeed free of any DVTs. 12/17/2018 on evaluation today patient actually appears to be completely healed today. He does have 2 very small areas of blistering although this is not anything too significant at this point which is good news. With that being said I am in agreement with the fact that I think he is completely healed at this point. He does want to get back into his compression stocking. The good news is we have gotten approval from insurance for his lymphedema pumps we received a letter since last saw him last week. The other good news is his study did come back and showed no evidence of a DVT. 12/20/2018 on evaluation today patient presents for follow-up concerning his ongoing issues with his left lower extremity. He was actually discharged last Friday and did fairly well until he states blisters opened this morning. He tells me he has been wearing his compression stocking although he has a hard time getting this on. There does not appear to be any signs of active infection at this time. No fevers, chills, nausea, vomiting, or diarrhea. 12/27/2018 on evaluation today patient appears to be doing very well with regard to his swelling of the left lower extremity the 4 layer compression wrap seems to have been beneficial for him. Fortunately there is no signs of active infection at this time. Patient has been tolerating the compression wrap without complication and his foot swelling in particular appears to be greatly improved. He does still have a wound on  the lateral portion of his left leg I believe this is more of a blister that has now reopened. [name], [name] ([id number]) 01/03/2019 on evaluation today patient actually appears to be doing excellent in regard to his left lower extremity. He did receive his compression pumps and is actually use this 7 times since he was last here in the office. On top of the  compression wrap he is now roughly 3 cm better at the calf and 2 cm better at the ankle he also states that his foot seem to go an issue better without even having to use a shoe horn. Obviously I think this is all evidence that he is doing excellent in this regard. The other good news is he does not appear to have anything open today as far as wounds are concerned. 01/15/2019 on evaluation today patient appears to be doing more poorly yet again with regard to his left lower extremity. He has developed new wounds again after being discharged just recently. Unfortunately this continues to be the case that he will heal and then have subsequent new wounds. The last time I was hopeful that he may not end up coming back too quickly especially since he states he has been using his lymphedema pumps along with wearing his compression. Nonetheless he had a blister on the back of his leg that popped up on the left and this has opened up into an ulceration it is quite painful. 01/22/19 on evaluation today patient actually appears to be doing well with regard to his wound on the left lower extremity. He's been tolerating the dressing changes without complication including the compression wrap in the wound appears to be significantly smaller today which is great news. Overall very pleased in this regard. 01/29/2019 on evaluation today patient appears to be doing well with regard to his left posterior lower extremity ulcer. He has been tolerating the dressing changes without complication. This is not completely healed but is getting much closer. We did order a  Farrow wrap 4000 for him he has received this and has it with him today although I am not sure we are quite ready to start him on that as of yet. We are very close. 02/05/2019 on evaluation today patient actually appears to be doing quite well with regard to his left posterior lower extremity ulcer. He still has a very tiny opening remaining but the fortunate thing is he seems to be healing quite nicely. He also did get his Farrow wrap which I am hoping will help with his edema control as well at home. Fortunately there is no evidence of active infection. 02/12/2019 patient and fortunately appears to be doing poorly in regard to his wounds of the left lower extremity. He was very close to healing therefore we attempted to use his Velcro compression wraps continuing with lymphedema pumps at home. Unfortunately that does not seem to have done very well for him. He tells me that he wore them all the time but again I am not sure why if that is the case that he is having such significant edema. He is still on his fluid pills as well. With that being said there is no obvious sign of infection although I do wonder about the possibility of infection at this time as well. 02/19/2019 unfortunately upon evaluation today patient appears to be doing more poorly with regard to his left lower extremity. He is not showing signs of significant improvement and I think the biggest issue here is that he does have an infection that appears to likely be Pseudomonas. That is based on the blue-green drainage that were noted at this time. Unfortunately the antibiotic that has been on is not going to take care of this at all. I think they will get a need to switch him to either Levaquin or Cipro and this was discussed with the patient. 02/26/2019 on  evaluation today patient's lower extremity on the left appears to be doing significantly better as compared to last evaluation. Fortunately there is no signs of active infection at  this time. He has been tolerating the compression wrap without complication in fact he made it the whole week at this point. He is showing signs of excellent improvement I am very happy in this regard. With that being said he is having some issues with infection we did review the results of his culture which I noted today. He did have a positive finding for Enterobacter as well as Alcaligenes faecalis. Fortunately the Levaquin that I placed him on will work for both which is great news. There is no signs of systemic infection at this point. Patient History Information obtained from Patient. Family History Cancer - Mother, Stroke - Father, No family history of Diabetes, Heart Disease, Hereditary Spherocytosis, Hypertension, Kidney Disease, Lung Disease, Seizures, Thyroid Problems, Tuberculosis. Social History Former smoker - quit 5 years ago, Marital Status - Divorced, Alcohol Use - Never, Drug Use - No History, Caffeine Use - Daily. Medical History Cardiovascular [name], [name] ([id number]) Patient has history of Hypertension, Peripheral Venous Disease Denies history of Angina, Arrhythmia, Congestive Heart Failure, Coronary Artery Disease, Deep Vein Thrombosis, Hypotension, Myocardial Infarction, Peripheral Arterial Disease, Phlebitis, Vasculitis Endocrine Patient has history of Type II Diabetes Integumentary (Skin) Denies history of History of Burn, History of pressure wounds Neurologic Patient has history of Neuropathy Denies history of Dementia, Quadriplegia, Paraplegia, Seizure Disorder Oncologic Patient has history of Received Chemotherapy Denies history of Received Radiation Medical And Surgical History Notes 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 Respiratory Denies complaints or symptoms of Chronic or frequent coughs, Shortness  of Breath. Cardiovascular Denies complaints or symptoms of Chest pain, LE edema. Psychiatric Denies complaints or symptoms of Anxiety, Claustrophobia. Objective Constitutional Obese and well-hydrated in no acute distress. Vitals Time Taken: 12:58 PM, Height: 73 in, Weight: 300 lbs, BMI: 39.6, Temperature: 98.6 F, Pulse: 76 bpm, Respiratory Rate: 16 breaths/min, Blood Pressure: 121/60 mmHg. Respiratory normal breathing without difficulty. clear to auscultation bilaterally. Cardiovascular regular rate and rhythm with normal S1, S2. Psychiatric this patient is able to make decisions and demonstrates good insight into disease process. Alert and Oriented x 3. pleasant and cooperative. General Notes: Patient's wound bed currently is showing signs of good epithelialization at this point the results of his use of the Adaptic show excellent improvement as far as the dressings are concerned nothing is sticking badly to the wound bed which is good news. Fortunately there is no evidence of active infection at this time systemically although locally the infection [place], [name] ([id number]) is resolving as well. Overall I am very pleased in this regard. He still has significant swelling of the left lower extremity. Integumentary (Hair, Skin) Wound #8 status is Open. Original cause of wound was Blister. The wound is located on the Left,Posterior Lower Leg. The wound measures 13.4cm length x 6.2cm width x 0.1cm depth; 65.251cm^2 area and 6.525cm^3 volume. There is Fat Layer (Subcutaneous Tissue) Exposed exposed. There is no tunneling or undermining noted. There is a large amount of serous drainage noted. The wound margin is flat and intact. There is large (67-100%) red granulation within the wound bed. There is a small (1-33%) amount of necrotic tissue within the wound bed including Adherent Slough. Wound #9 status is Healed - Epithelialized. Original cause of wound  was Gradually Appeared. The wound is  located on the Left,Midline,Anterior Lower Leg. The wound measures 0cm length x 0cm width x 0cm depth; 0cm^2 area and 0cm^3 volume. There is no tunneling or undermining noted. There is a none present amount of drainage noted. The wound margin is flat and intact. There is large (67-100%) red granulation within the wound bed. There is a small (1-33%) amount of necrotic tissue within the wound bed including Eschar. Assessment Active Problems ICD-10 Type 2 diabetes mellitus with other skin ulcer Lymphedema, not elsewhere classified Venous insufficiency (chronic) (peripheral) Non-pressure chronic ulcer of other part of left lower leg with fat layer exposed Essential (primary) hypertension Procedures Wound #8 Pre-procedure diagnosis of Wound #8 is a Diabetic Wound/Ulcer of the Lower Extremity located on the Left,Posterior Lower Leg . There was a Three Layer Compression Therapy Procedure with a pre-treatment ABI of 1.5 by Montey Hora, RN. Post procedure Diagnosis Wound #8: Same as Pre-Procedure Plan Wound Cleansing: Wound #8 Left,Posterior Lower Leg: May shower with protection. - Please do not get your wrap wet Primary Wound Dressing: Wound #8 Left,Posterior Lower Leg: Silver Alginate Other: - adaptic contact layer Secondary Dressing: Wound #8 Left,Posterior Lower Leg: ABD pad [name], [name] ([id number]) XtraSorb Dressing Change Frequency: Wound #8 Left,Posterior Lower Leg: Other: - Twice Weekly - Tuesdays and Fridays Follow-up Appointments: Wound #8 Left,Posterior Lower Leg: Return Appointment in 1 week. - Friday 03/08/19 Nurse Visit as needed - Friday 03/01/19 and Tuesday 03/05/19 Edema Control: Wound #8 Left,Posterior Lower Leg: 3 Layer Compression System - Left Lower Extremity Consults ordered were: Vascular - [place] for Consult for Venous Insufficiency 1. My suggestion currently is good to be that we go ahead and continue with the current wound care measures which  includes the silver alginate dressing with Adaptic underneath in order to prevent the dressing from sticking to his wound. 2. A medic recommend we continue with the ABD pads and XtraSorb as well for The Excessive Drainage. 3. I Am Also Can Recommend That We Continue with a 3 Layer Compression Wrap Which We Have Been Utilizing for Him. 4. I Am Get a Recommend Nurse Visits for Him over the Next Week until He Is Seen by Dr. Dellia Nims Next Friday. Hopefully We Can Keep Things from Becoming Too Macerated and Allow This to Continue to Heal. 5. I Am Going to Suggest That We Go Ahead and Make an Appointment for the Patient to Be Seen at Vascular in [facility] for Second Opinion regarding His Venous Stasis He Would like to Go That Route at This Point. He Heals and We Have Been Able to [place] Several Times Only to Have This Reopen He Is Becoming Very Frustrated Which I Completely Understand We Will See If There Is Anything They Can Do to Aid in This Regard. We will see patient back for reevaluation in 1 week here in the clinic. If anything worsens or changes patient will contact our office for additional recommendations. Electronic Signature(s) Signed: 02/26/2019 1:44:23 PM By: Worthy Keeler PA-C Entered By: Worthy Keeler on 02/26/2019 13:44:22 [name] ([id number]) -------------------------------------------------------------------------------- ROS/PFSH Details Patient Name: [name] Date of Service: 02/26/2019 1:00 PM Medical Record Number: [id number] Patient Account Number: [id number] Date of Birth/Sex: [birth year] (68 y.o. M) Treating RN: Montey Hora Primary Care Provider: SYSTEM, PCP Other Clinician: Referring Provider: Referral, Self Treating Provider/Extender: STONE III, [name] Weeks in Treatment: 15 Information Obtained From Patient Constitutional Symptoms (General Health) Complaints and Symptoms: Negative for: Fatigue; Fever; Chills; Marked Weight  Change Respiratory Complaints and Symptoms: Negative for: Chronic or frequent coughs; Shortness of Breath Cardiovascular Complaints and Symptoms: Negative for: Chest pain; LE edema Medical History: Positive for: Hypertension; Peripheral Venous Disease Negative for: Angina; Arrhythmia; Congestive Heart Failure; Coronary Artery Disease; Deep Vein Thrombosis; Hypotension; Myocardial Infarction; Peripheral Arterial Disease; Phlebitis; Vasculitis Psychiatric Complaints and Symptoms: Negative for: Anxiety; Claustrophobia Endocrine Medical History: Positive for: Type II Diabetes Treated with: Oral agents Integumentary (Skin) Medical History: Negative for: History of Burn; History of pressure wounds Neurologic Medical History: Positive for: Neuropathy Negative for: Dementia; Quadriplegia; Paraplegia; Seizure Disorder Oncologic Medical History: Positive for: Received Chemotherapy Negative for: Received Radiation [name], [name] ([id number]) Past Medical History Notes: 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 Immunizations Pneumococcal Vaccine: Received Pneumococcal Vaccination: Yes Immunization Notes: up to date Implantable Devices None Family and Social History Cancer: Yes - Mother; Diabetes: No; Heart Disease: No; Hereditary Spherocytosis: No; Hypertension: No; Kidney Disease: No; Lung Disease: No; Seizures: No; Stroke: Yes - Father; Thyroid Problems: No; Tuberculosis: No; Former smoker - quit 5 years ago; Marital Status - Divorced; Alcohol Use: Never; Drug Use: No History; Caffeine Use: Daily; Financial Concerns: No; Food, Clothing or Shelter Needs: No; Support System Lacking: No; Transportation Concerns: No Physician Affirmation I have reviewed and agree with the above information. Electronic Signature(s) Signed: 02/26/2019 4:44:40 PM By: Montey Hora Signed: 02/26/2019 7:13:08 PM By: Worthy Keeler PA-C Entered By: Worthy Keeler on 02/26/2019 13:42:42 [name]  ([id number]) -------------------------------------------------------------------------------- SuperBill Details Patient Name: [name] Date of Service: 02/26/2019 Medical Record Number: [id number] Patient Account Number: [id number] Date of Birth/Sex: Jan 11, 1951 (68 y.o. M) Treating RN: Montey Hora Primary Care Provider: SYSTEM, PCP Other Clinician: Referring Provider: Referral, Self Treating Provider/Extender: STONE III, [name] Weeks in Treatment: 15 Diagnosis Coding ICD-10 Codes Code Description E11.622 Type 2 diabetes mellitus with other skin ulcer I89.0 Lymphedema, not elsewhere classified I87.2 Venous insufficiency (chronic) (peripheral) L97.822 Non-pressure chronic ulcer of other part of left lower leg with fat layer exposed I10 Essential (primary) hypertension Facility Procedures CPT4 Code: [id number] Description: (Facility Use Only) [phone] - APPLY [place] LWR LT LEG Modifier: Quantity: 1 Physician Procedures CPT4 Code Description: [id number] - WC PHYS LEVEL 4 - EST PT ICD-10 Diagnosis Description E11.622 Type 2 diabetes mellitus with other skin ulcer I89.0 Lymphedema, not elsewhere classified I87.2 Venous insufficiency (chronic) (peripheral) L97.822  Non-pressure chronic ulcer of other part of left lower leg wit Modifier: h fat layer expos Quantity: 1 ed Electronic Signature(s) Signed: 02/26/2019 1:45:30 PM By: Worthy Keeler PA-C Previous Signature: 02/26/2019 1:39:52 PM Version By: Montey Hora Entered By: Worthy Keeler on 02/26/2019 13:45:29

## 2019-03-01 ENCOUNTER — Other Ambulatory Visit: Payer: Self-pay

## 2019-03-01 DIAGNOSIS — E11622 Type 2 diabetes mellitus with other skin ulcer: Secondary | ICD-10-CM | POA: Diagnosis not present

## 2019-03-02 NOTE — Progress Notes (Signed)
HERCHEL, MCNITT ([id number]) Visit Report for 03/01/2019 Arrival Information Details Patient Name: [name], [name] Date of Service: 03/01/2019 3:00 PM Medical Record Number: [id number] Patient Account Number: [id number] Date of Birth/Sex: October 25, 1950 (68 y.o. M) Treating RN: Army Melia Primary Care Rien Marland: SYSTEM, PCP Other Clinician: Referring Burgundy Matuszak: Referral, Self Treating Quentavious Rittenhouse/Extender: STONE III, [name] Weeks in Treatment: 15 Visit Information History Since Last Visit Added or deleted any medications: No Patient Arrived: Ambulatory Any new allergies or adverse reactions: No Arrival Time: 15:18 Had a fall or experienced change in No Accompanied By: self activities of daily living that may affect Transfer Assistance: None risk of falls: Patient Identification Verified: Yes Signs or symptoms of abuse/neglect since last visito No Patient Has Alerts: Yes Hospitalized since last visit: No Patient Alerts: DMII Has Dressing in Place as Prescribed: Yes Pain Present Now: No Electronic Signature(s) Signed: 03/01/2019 4:31:01 PM By: Army Melia Entered By: Army Melia on 03/01/2019 15:18:29 [name] ([id number]) -------------------------------------------------------------------------------- Compression Therapy Details Patient Name: [name] Date of Service: 03/01/2019 3:00 PM Medical Record Number: [id number] Patient Account Number: [id number] Date of Birth/Sex: Sep 15, 1950 (68 y.o. M) Treating RN: Army Melia Primary Care Macio Kissoon: SYSTEM, PCP Other Clinician: Referring Kamorah Nevils: Referral, Self Treating Aidyn Sportsman/Extender: STONE III, [name] Weeks in Treatment: 15 Compression Therapy Performed for Wound Assessment: Wound #8 Left,Posterior Lower Leg Performed By: Clinician Army Melia, RN Compression Type: Three Layer Pre Treatment ABI: 1.5 Electronic Signature(s) Signed: 03/01/2019 4:31:01 PM By: Army Melia Entered By: Army Melia on 03/01/2019 15:26:24 [name] ([id number]) -------------------------------------------------------------------------------- Encounter Discharge Information Details Patient Name: [name] Date of Service: 03/01/2019 3:00 PM Medical Record Number: [id number] Patient Account Number: [id number] Date of Birth/Sex: 13-Oct-1950 (68 y.o. M) Treating RN: Army Melia Primary Care Alayna Mabe: SYSTEM, PCP Other Clinician: Referring Arali Somera: Referral, Self Treating Jamil Castillo/Extender: STONE III, [name] Weeks in Treatment: 15 Encounter Discharge Information Items Discharge Condition: Stable Ambulatory Status: Ambulatory Discharge Destination: Home Transportation: Private Auto Accompanied By: self Schedule Follow-up Appointment: Yes Clinical Summary of Care: Electronic Signature(s) Signed: 03/01/2019 4:31:01 PM By: Army Melia Entered By: Army Melia on 03/01/2019 15:26:58 [name] ([id number]) -------------------------------------------------------------------------------- Wound Assessment Details Patient Name: [name] Date of Service: 03/01/2019 3:00 PM Medical Record Number: [id number] Patient Account Number: [id number] Date of Birth/Sex: 28-Oct-1950 (68 y.o. M) Treating RN: Army Melia Primary Care Ayiden Milliman: SYSTEM, PCP Other Clinician: Referring Kentavious Michele: Referral, Self Treating Bascom Biel/Extender: STONE III, [name] Weeks in Treatment: 15 Wound Status Wound Number: 8 Primary Diabetic Wound/Ulcer of the Lower Extremity Etiology: Wound Location: Left Lower Leg - Posterior Wound Open Wounding Event: Blister Status: Date Acquired: 01/01/2019 Comorbid Hypertension, Peripheral Venous Disease, Weeks Of Treatment: 6 History: Type II Diabetes, Neuropathy, Received Clustered Wound: No Chemotherapy Photos Wound Measurements Length: (cm) 12 % Reduction i Width: (cm) 8 % Reduction i Depth: (cm) 0.1 Epithelializa Area: (cm) 75.398 Tunneling: Volume: (cm) 7.54 Undermining: n Area: -2723.9% n Volume:  -2724% tion: None No No Wound Description Classification: Grade 1 Foul Odor Aft Wound Margin: Flat and Intact Slough/Fibrin Exudate Amount: Large Exudate Type: Serous Exudate Color: amber er Cleansing: No o Yes Wound Bed Granulation Amount: Large (67-100%) Exposed Structure Granulation Quality: Red Fascia Exposed: No Necrotic Amount: Small (1-33%) Fat Layer (Subcutaneous Tissue) Exposed: Yes Necrotic Quality: Adherent Slough Tendon Exposed: No Muscle Exposed: No Joint Exposed: No Bone Exposed: No Treatment Notes SHAIDEN, JORY ([id number]) Wound #8 (Left, Posterior Lower Leg) Notes silvercel, xtrasorb, 3 layer wrap with unna to anchor Electronic Signature(s) Signed: 03/01/2019 4:31:01 PM By: Army Melia Entered By:  Army Melia on 03/01/2019 15:19:15

## 2019-03-05 ENCOUNTER — Other Ambulatory Visit: Payer: Self-pay

## 2019-03-05 DIAGNOSIS — E11622 Type 2 diabetes mellitus with other skin ulcer: Secondary | ICD-10-CM | POA: Diagnosis not present

## 2019-03-05 NOTE — Progress Notes (Signed)
RHYSE, HOUGEN ([id number]) Visit Report for 03/05/2019 Arrival Information Details Patient Name: JORELL, GIBNEY Date of Service: 03/05/2019 9:00 AM Medical Record Number: [id number] Patient Account Number: [id number] Date of Birth/Sex: [birth year] (68 y.o. M) Treating RN: Army Melia Primary Care Andrian Sabala: SYSTEM, PCP Other Clinician: Referring Teyona Nichelson: Referral, Self Treating Espiridion Supinski/Extender: STONE III, [name] Weeks in Treatment: 16 Visit Information History Since Last Visit Added or deleted any medications: No Patient Arrived: Ambulatory Any new allergies or adverse reactions: No Arrival Time: 09:08 Had a fall or experienced change in No Accompanied By: self activities of daily living that may affect Transfer Assistance: None risk of falls: Patient Has Alerts: Yes Signs or symptoms of abuse/neglect since last visito No Patient Alerts: DMII Hospitalized since last visit: No Has Dressing in Place as Prescribed: Yes Pain Present Now: No Electronic Signature(s) Signed: 03/05/2019 11:16:23 AM By: Army Melia Entered By: Army Melia on 03/05/2019 09:09:01 [name] ([id number]) -------------------------------------------------------------------------------- Compression Therapy Details Patient Name: [name] Date of Service: 03/05/2019 9:00 AM Medical Record Number: [id number] Patient Account Number: [id number] Date of Birth/Sex: 05/14/1950 (68 y.o. M) Treating RN: Army Melia Primary Care Anastazja Isaac: SYSTEM, PCP Other Clinician: Referring Oyuki Hogan: Referral, Self Treating Joah Patlan/Extender: STONE III, [name] Weeks in Treatment: 16 Compression Therapy Performed for Wound Assessment: Wound #8 Left,Posterior Lower Leg Performed By: Clinician Army Melia, RN Compression Type: Three Layer Electronic Signature(s) Signed: 03/05/2019 11:16:23 AM By: Army Melia Entered By: Army Melia on 03/05/2019 09:13:29 [name]  ([id number]) -------------------------------------------------------------------------------- Encounter Discharge Information Details Patient Name: [name] Date of Service: 03/05/2019 9:00 AM Medical Record Number: [id number] Patient Account Number: [id number] Date of Birth/Sex: Sep 14, 1950 (68 y.o. M) Treating RN: Army Melia Primary Care Caoimhe Damron: SYSTEM, PCP Other Clinician: Referring Bence Trapp: Referral, Self Treating Oletta Buehring/Extender: STONE III, [name] Weeks in Treatment: 16 Encounter Discharge Information Items Discharge Condition: Stable Ambulatory Status: Ambulatory Discharge Destination: Home Transportation: Private Auto Accompanied By: self Schedule Follow-up Appointment: Yes Clinical Summary of Care: Electronic Signature(s) Signed: 03/05/2019 11:16:23 AM By: Army Melia Entered By: Army Melia on 03/05/2019 09:13:53 [name] ([id number]) -------------------------------------------------------------------------------- Wound Assessment Details Patient Name: [name] Date of Service: 03/05/2019 9:00 AM Medical Record Number: [id number] Patient Account Number: [id number] Date of Birth/Sex: 1950/06/28 (68 y.o. M) Treating RN: Army Melia Primary Care Graylee Arutyunyan: SYSTEM, PCP Other Clinician: Referring Sherra [name]: Referral, Self Treating Cindy Fullman/Extender: STONE III, [name] Weeks in Treatment: 16 Wound Status Wound Number: 8 Primary Diabetic Wound/Ulcer of the Lower Extremity Etiology: Wound Location: Left Lower Leg - Posterior Wound Open Wounding Event: Blister Status: Date Acquired: 01/01/2019 Comorbid Hypertension, Peripheral Venous Disease, Weeks Of Treatment: 7 History: Type II Diabetes, Neuropathy, Received Clustered Wound: No Chemotherapy Photos Wound Measurements Length: (cm) 2.8 % Reduction i Width: (cm) 5.2 % Reduction i Depth: (cm) 0.1 Epithelializa Area: (cm) 11.435 Volume: (cm) 1.144 n Area: -328.3% n Volume: -328.5% tion: None Wound  Description Classification: Grade 1 Foul Odor Aft Wound Margin: Flat and Intact Slough/Fibrin Exudate Amount: Large Exudate Type: Serous Exudate Color: amber er Cleansing: No o Yes Wound Bed Granulation Amount: Large (67-100%) Exposed Structure Granulation Quality: Red Fascia Exposed: No Necrotic Amount: Small (1-33%) Fat Layer (Subcutaneous Tissue) Exposed: Yes Necrotic Quality: Adherent Slough Tendon Exposed: No Muscle Exposed: No Joint Exposed: No Bone Exposed: No Treatment Notes MALEKAI, CARLE ([id number]) Wound #8 (Left, Posterior Lower Leg) Notes silvercel, xtrasorb, 3 layer wrap with unna to anchor Electronic Signature(s) Signed: 03/05/2019 11:16:23 AM By: Army Melia Entered By: Army Melia on 03/05/2019 09:13:10

## 2019-03-08 ENCOUNTER — Other Ambulatory Visit: Payer: Self-pay

## 2019-03-08 ENCOUNTER — Encounter: Payer: PPO | Admitting: Internal Medicine

## 2019-03-08 DIAGNOSIS — E11622 Type 2 diabetes mellitus with other skin ulcer: Secondary | ICD-10-CM | POA: Diagnosis not present

## 2019-03-08 DIAGNOSIS — L97222 Non-pressure chronic ulcer of left calf with fat layer exposed: Secondary | ICD-10-CM | POA: Diagnosis not present

## 2019-03-08 NOTE — Progress Notes (Signed)
THAISON, KALUZA ([id number]) Visit Report for 03/08/2019 Arrival Information Details Patient Name: JALIK, PHARO Date of Service: 03/08/2019 9:00 AM Medical Record Number: [id number] Patient Account Number: [id number] Date of Birth/Sex: October 08, 1950 (68 y.o. M) Treating RN: Harold Barban Primary Care Odis Turck: SYSTEM, PCP Other Clinician: Referring Berel Najjar: Referral, Self Treating Sanae Willetts/Extender: Tito Dine in Treatment: 16 Visit Information History Since Last Visit Added or deleted any medications: No Patient Arrived: Ambulatory Any new allergies or adverse reactions: No Arrival Time: 08:57 Had a fall or experienced change in No Accompanied By: self activities of daily living that may affect Transfer Assistance: None risk of falls: Patient Identification Verified: Yes Signs or symptoms of abuse/neglect since last visito No Secondary Verification Process Completed: Yes Hospitalized since last visit: No Patient Has Alerts: Yes Has Dressing in Place as Prescribed: Yes Patient Alerts: DMII Has Compression in Place as Prescribed: Yes Pain Present Now: No Electronic Signature(s) Signed: 03/08/2019 4:47:34 PM By: Harold Barban Entered By: Harold Barban on 03/08/2019 08:58:17 [name] ([id number]) -------------------------------------------------------------------------------- Compression Therapy Details Patient Name: [name] Date of Service: 03/08/2019 9:00 AM Medical Record Number: [id number] Patient Account Number: [id number] Date of Birth/Sex: 01/19/1951 (68 y.o. M) Treating RN: Montey Hora Primary Care Keandra Medero: SYSTEM, PCP Other Clinician: Referring Lativia Velie: Referral, Self Treating Puanani Gene/Extender: Tito Dine in Treatment: 16 Compression Therapy Performed for Wound Assessment: Wound #8 Left,Posterior Lower Leg Performed By: Clinician Montey Hora, RN Compression Type: Four Layer Pre Treatment ABI: 1.5 Post Procedure  Diagnosis Same as Pre-procedure Electronic Signature(s) Signed: 03/08/2019 5:07:31 PM By: Montey Hora Entered By: Montey Hora on 03/08/2019 09:34:41 [name] ([id number]) -------------------------------------------------------------------------------- Encounter Discharge Information Details Patient Name: [name] Date of Service: 03/08/2019 9:00 AM Medical Record Number: [id number] Patient Account Number: [id number] Date of Birth/Sex: 1950/12/03 (68 y.o. M) Treating RN: Montey Hora Primary Care Ruberta Holck: SYSTEM, PCP Other Clinician: Referring Loleta Frommelt: Referral, Self Treating Thara Searing/Extender: Tito Dine in Treatment: 16 Encounter Discharge Information Items Discharge Condition: Stable Ambulatory Status: Ambulatory Discharge Destination: Home Transportation: Private Auto Accompanied By: self Schedule Follow-up Appointment: Yes Clinical Summary of Care: Electronic Signature(s) Signed: 03/08/2019 5:07:31 PM By: Montey Hora Entered By: Montey Hora on 03/08/2019 09:37:18 [name] ([id number]) -------------------------------------------------------------------------------- Lower Extremity Assessment Details Patient Name: [name] Date of Service: 03/08/2019 9:00 AM Medical Record Number: [id number] Patient Account Number: [id number] Date of Birth/Sex: [birth year] (68 y.o. M) Treating RN: Harold Barban Primary Care Garnette Greb: SYSTEM, PCP Other Clinician: Referring Romy Mcgue: Referral, Self Treating Miah Boye/Extender: Ricard Dillon Weeks in Treatment: 16 Edema Assessment Assessed: [Left: No] [Right: No] [Left: Edema] [Right: :] Calf Left: Right: Point of Measurement: 35 cm From Medial Instep 45 cm cm Ankle Left: Right: Point of Measurement: 11 cm From Medial Instep 34 cm cm Electronic Signature(s) Signed: 03/08/2019 4:47:34 PM By: Harold Barban Entered By: Harold Barban on 03/08/2019 09:10:22 [name]  ([id number]) -------------------------------------------------------------------------------- Multi Wound Chart Details Patient Name: [name] Date of Service: 03/08/2019 9:00 AM Medical Record Number: [id number] Patient Account Number: [id number] Date of Birth/Sex: July 08, 1950 (68 y.o. M) Treating RN: Montey Hora Primary Care Ole Lafon: SYSTEM, PCP Other Clinician: Referring Kilynn Fitzsimmons: Referral, Self Treating Linell Meldrum/Extender: Tito Dine in Treatment: 16 Vital Signs Height(in): 73 Pulse(bpm): 91 Weight(lbs): 300 Blood Pressure(mmHg): 119/51 Body Mass Index(BMI): 40 Temperature(F): 98.3 Respiratory Rate 20 (breaths/min): Photos: [N/A:N/A] Wound Location: Left Lower Leg - Posterior N/A N/A Wounding Event: Blister N/A N/A Primary Etiology: Diabetic Wound/Ulcer of the N/A N/A Lower Extremity Comorbid History: Hypertension, Peripheral N/A N/A Venous  Disease, Type II Diabetes, Neuropathy, Received Chemotherapy Date Acquired: 01/01/2019 N/A N/A Weeks of Treatment: 7 N/A N/A Wound Status: Open N/A N/A Measurements L x W x D 1.5x4.4x0.1 N/A N/A (cm) Area (cm) : 5.184 N/A N/A Volume (cm) : 0.518 N/A N/A % Reduction in Area: -94.20% N/A N/A % Reduction in Volume: -94.00% N/A N/A Classification: Grade 1 N/A N/A Exudate Amount: Large N/A N/A Exudate Type: Serous N/A N/A Exudate Color: amber N/A N/A Wound Margin: Flat and Intact N/A N/A Granulation Amount: Large (67-100%) N/A N/A Granulation Quality: Red N/A N/A Necrotic Amount: Small (1-33%) N/A N/A Exposed Structures: Fat Layer (Subcutaneous N/A N/A Tissue) Exposed: Yes Fascia: No Tendon: No IKEY, MATSEN ([id number]) Muscle: No Joint: No Bone: No Epithelialization: None N/A N/A Procedures Performed: Compression Therapy N/A N/A Treatment Notes Wound #8 (Left, Posterior Lower Leg) Notes adapitc, silvercel, xtrasorb, 4 layer wrap with unna to anchor Electronic Signature(s) Signed: 03/08/2019 5:42:32  PM By: Linton Ham MD Entered By: Linton Ham on 03/08/2019 09:58:51 [name] ([id number]) -------------------------------------------------------------------------------- Multi-Disciplinary Care Plan Details Patient Name: [name] Date of Service: 03/08/2019 9:00 AM Medical Record Number: [id number] Patient Account Number: [id number] Date of Birth/Sex: 07-Apr-1951 (68 y.o. M) Treating RN: Montey Hora Primary Care Katrisha Segall: SYSTEM, PCP Other Clinician: Referring Zamaria Brazzle: Referral, Self Treating Jessel Gettinger/Extender: Tito Dine in Treatment: 16 Active Inactive Venous Leg Ulcer Nursing Diagnoses: Actual venous Insuffiency (use after diagnosis is confirmed) Knowledge deficit related to disease process and management Goals: Patient will maintain optimal edema control Date Initiated: 11/12/2018 Target Resolution Date: 03/08/2019 Goal Status: Active Patient/caregiver will verbalize understanding of disease process and disease management Date Initiated: 11/12/2018 Target Resolution Date: 03/08/2019 Goal Status: Active Interventions: Assess peripheral edema status every visit. Compression as ordered Provide education on venous insufficiency Notes: Wound/Skin Impairment Nursing Diagnoses: Impaired tissue integrity Knowledge deficit related to ulceration/compromised skin integrity Goals: Ulcer/skin breakdown will have a volume reduction of 30% by week 4 Date Initiated: 11/12/2018 Target Resolution Date: 03/08/2019 Goal Status: Active Interventions: Assess patient/caregiver ability to obtain necessary supplies Assess patient/caregiver ability to perform ulcer/skin care regimen upon admission and as needed Assess ulceration(s) every visit Provide education on ulcer and skin care Notes: GOPAL, MELODY ([id number]) Electronic Signature(s) Signed: 03/08/2019 5:07:31 PM By: Montey Hora Entered By: Montey Hora on 03/08/2019 09:34:17 [name]  ([id number]) -------------------------------------------------------------------------------- Pain Assessment Details Patient Name: [name] Date of Service: 03/08/2019 9:00 AM Medical Record Number: [id number] Patient Account Number: [id number] Date of Birth/Sex: 1951-01-11 (68 y.o. M) Treating RN: Harold Barban Primary Care Dewel Lotter: SYSTEM, PCP Other Clinician: Referring Olukemi Panchal: Referral, Self Treating Rome Echavarria/Extender: Tito Dine in Treatment: 16 Active Problems Location of Pain Severity and Description of Pain Patient Has Paino No Site Locations Pain Management and Medication Current Pain Management: Electronic Signature(s) Signed: 03/08/2019 4:47:34 PM By: Harold Barban Entered By: Harold Barban on 03/08/2019 09:00:43 [name] ([id number]) -------------------------------------------------------------------------------- Patient/Caregiver Education Details Patient Name: [name] Date of Service: 03/08/2019 9:00 AM Medical Record Number: [id number] Patient Account Number: [id number] Date of Birth/Gender: Jun 28, 1950 (68 y.o. M) Treating RN: Montey Hora Primary Care Physician: SYSTEM, PCP Other Clinician: Referring Physician: Referral, Self Treating Physician/Extender: Tito Dine in Treatment: 16 Education Assessment Education Provided To: Patient Education Topics Provided Venous: Handouts: Other: need for compression Methods: Explain/Verbal Responses: State content correctly Electronic Signature(s) Signed: 03/08/2019 5:07:31 PM By: Montey Hora Entered By: Montey Hora on 03/08/2019 09:35:57 [name] ([id number]) -------------------------------------------------------------------------------- Wound Assessment Details Patient Name: [name] Date of Service: 03/08/2019 9:00  AM Medical Record Number: [id number] Patient Account Number: [id number] Date of Birth/Sex: [birth year] (68 y.o. M) Treating RN: Harold Barban Primary Care Boykin Baetz: SYSTEM, PCP Other Clinician: Referring Odas Ozer: Referral, Self Treating Nakaiya Beddow/Extender: Ricard Dillon Weeks in Treatment: 16 Wound Status Wound Number: 8 Primary Diabetic Wound/Ulcer of the Lower Extremity Etiology: Wound Location: Left Lower Leg - Posterior Wound Open Wounding Event: Blister Status: Date Acquired: 01/01/2019 Comorbid Hypertension, Peripheral Venous Disease, Type Weeks Of Treatment: 7 History: II Diabetes, Neuropathy, Received Clustered Wound: No Chemotherapy Photos Wound Measurements Length: (cm) 1.5 % Reduction Width: (cm) 4.4 % Reduction Depth: (cm) 0.1 Epithelializ Area: (cm) 5.184 Tunneling: Volume: (cm) 0.518 Undermining in Area: -94.2% in Volume: -94% ation: None No : No Wound Description Classification: Grade 1 Foul Odor A Wound Margin: Flat and Intact Slough/Fibr Exudate Amount: Large Exudate Type: Serous Exudate Color: amber fter Cleansing: No ino Yes Wound Bed Granulation Amount: Large (67-100%) Exposed Structure Granulation Quality: Red Fascia Exposed: No Necrotic Amount: Small (1-33%) Fat Layer (Subcutaneous Tissue) Exposed: Yes Necrotic Quality: Adherent Slough Tendon Exposed: No Muscle Exposed: No Joint Exposed: No Bone Exposed: No Treatment Notes AOUS, MODEL ([id number]) Wound #8 (Left, Posterior Lower Leg) Notes adapitc, silvercel, xtrasorb, 4 layer wrap with unna to anchor Electronic Signature(s) Signed: 03/08/2019 4:47:34 PM By: Harold Barban Entered By: Harold Barban on 03/08/2019 09:11:45 [name] ([id number]) -------------------------------------------------------------------------------- Vitals Details Patient Name: [name] Date of Service: 03/08/2019 9:00 AM Medical Record Number: [id number] Patient Account Number: [id number] Date of Birth/Sex: [birth year] (68 y.o. M) Treating RN: Harold Barban Primary Care Avyukth Bontempo: SYSTEM, PCP Other Clinician: Referring  Kiara Mcdowell: Referral, Self Treating Bladimir Auman/Extender: Tito Dine in Treatment: 16 Vital Signs Time Taken: 09:00 Temperature (F): 98.3 Height (in): 73 Pulse (bpm): 67 Weight (lbs): 300 Respiratory Rate (breaths/min): 20 Body Mass Index (BMI): 39.6 Blood Pressure (mmHg): 119/51 Reference Range: 80 - 120 mg / dl Electronic Signature(s) Signed: 03/08/2019 4:47:34 PM By: Harold Barban Entered By: Harold Barban on 03/08/2019 09:00:59

## 2019-03-08 NOTE — Progress Notes (Signed)
FARAJ, OBORNY ([id number]) Visit Report for 03/08/2019 HPI Details Patient Name: [name], [name] Date of Service: 03/08/2019 9:00 AM Medical Record Number: [id number] Patient Account Number: [id number] Date of Birth/Sex: 02/24/1951 (68 y.o. M) Treating RN: Montey Hora Primary Care Provider: SYSTEM, PCP Other Clinician: Referring Provider: Referral, Self Treating Provider/Extender: Tito Dine in Treatment: 16 History of Present Illness HPI Description: 68 year old male who presented to the ER with bilateral lower extremity blisters which had started last week. he has a past medical history of leukemia, diabetes mellitus, hypertension, edema of both lower extremities, his recurrent skin infections, peripheral vascular disease, coronary artery disease, congestive heart failure and peripheral neuropathy. in the ER he was given Rocephin and put on Silvadene cream. he was put on oral doxycycline and was asked to follow-up with the [facility]. His last hemoglobin A1c was 6.6 in December and he checks his blood sugar once a week. He does not have any physicians outside the [place] system. He does not recall any vascular duplex studies done either for arterial or venous disease but was told to wear compression stockings which he does not use 05/30/2016 -- we have not yet received any of his notes from the [facility] hospital system and his arterial and venous duplex studies are scheduled here in [place] around mid February. We are unable to have his insurance accepted by home health agencies and hence he is getting dressings only once a week. 06/06/16 -- -- I received a call from the patient's PCP at the [facility] at [facility] and spoke to Dr. Garvin Fila, phone number [phone] and fax number [phone]. She confirmed that no vascular testing was done over the last 5 years and she would be happy to do them if the patient did want them to be done at the [place] and we could fax him a  request. Readmission: 68 year old male seen by as in February of this year and was referred to vein and vascular for studies and opinion from the vascular surgeons. The patient returns today with a fresh problem having had blisters on his left lower extremity which have been there for about 5 days and he clearly states that he has been wearing his compression stockings as advised though he could not read the moderate compression and has been wearing light compression. Review of his electronic medical records note that he had lower extremity arterial duplex examination done on 06/23/2016 which showed no hemodynamically significant stenosis in the bilateral lower extremity arterial system. He also had a lower extremity venous reflux examination done on 07/07/2016 and it was noted that he had venous incompetence in the right great saphenous vein and bilateral common femoral veins. Patient was seen by Dr. Tamala Julian on the same day and for some reason his notes do not reflect the venous studies or the arterial studies and he recommended patient do a venous duplex ultrasound to look for reflux and return to see him.he would also consider a lymph pump if required. The patient was told that his workup was normal and hence the patient canceled his follow-up appointment. 02/03/17 on evaluation today patient left medial lower extremity blister appears to be doing about the same. It is still continuing to drain and there's still the blistered skin covering the wound bed which is making it difficult for the alternate to do its job. Fortunately there is no evidence of cellulitis. No fevers chills noted. Patient states in general he is not having any significant discomfort. Patient's lower extremity arterial duplex exam  revealed that patient was hemodynamically stable with no evidence of stenosis in regard to the bilateral lower extremities. The lower extremity venous reflux exam revealed the patient had venous  incontinence noted in the right greater saphenous and bilateral common femoral vein. There is no evidence of deep or superficial vein thrombosis in the bilateral lower [name], [name] ([id number]) extremities. Readmission: 11/12/18 Patient presents for evaluation our clinic today concerning issues that he is having with his left lower extremity. He tells me that a couple weeks ago he began developing blisters on the left lower extremity along with increased swelling. He typically wears his compression stockings on a regular basis is previously been evaluated both here as well is with vascular surgery they would recommend lymphedema pumps but unfortunately that somehow fell through and he never heard anything back from that. Nonetheless I think lymphedema pumps would be beneficial for this patient. He does have a history of hypertension and diabetes. Obviously the chronic venous stasis and lymphedema as well. At this point the blisters have been given in more trouble he states sometimes when the blisters openings able to clean it down with alcohol and it will dry out and do well. Unfortunately that has not been the case this time. He is having some discomfort although this mean these with cleaning the areas he doesn't have discomfort just on a regular basis. He has not been able to wear his compression stockings since the blisters arose due to the fact that of course it will drain into the socks causing additional issues and he didn't have any way to wrap this otherwise. He has increased to taking his Lasix every day instead of every other day. He sees his primary care provider later this month as well. No fevers, chills, nausea, or vomiting noted at this time. 11/19/18-Patient returns at 1 week, per intake RN the amount of seepage into the compression wraps was definitely improved, overall all the wounds are measuring smaller but continuing silver alginate to the wounds as primary dressing 11/26/18 on  evaluation today patient appears to be doing quite well in regard to his left lower [place] ulcers. In fact of the areas that were noted initially he only has two regions still open. There is no evidence of active infection at this time. He still is not heard anything from the company regarding lymphedema pumps as of yet. Again as previously seen vascular they have not recommended any surgical intervention. 12/03/2018 on evaluation today patient actually appears to be doing quite well with regard to his lower extremity ulcers. In fact most of the areas appear to be healed the one spot which does not seem to be completely healed I am unsure of whether or not this is really draining that much but nonetheless there does not appear to be any signs of infection or significant drainage at this point. There is no sign of fever, chills, nausea, vomiting, or diarrhea. Overall I am pleased with how things have progressed I think is very close to being able to transition to his home compression stockings. 12/10/2018 upon evaluation today patient appears to be doing quite well with regard to his left lower extremity. He has been tolerating the dressing changes without complication. Fortunately there is no signs of active infection at this time. He appears after thorough evaluation of his leg to only have 1 small area that remains open at this point everything else appears to be almost completely closed. He still have significant swelling of the left lower  extremity. We had discussed discussing this with his primary care provider he is not able to see her in person they were at the [facility] and right now the [place] is not seeing patients on site. According to the patient anyway. Subsequently he did speak with her apparently and his primary care provider feels that he may likely have a DVT. With that being said she has not seen his leg she is just going off of his history. Nonetheless that is a concern that the  patient now has as well and while I do not feel the DVT is likely we can definitely ensure that that is not the case I will go ahead and see about putting that order in today. Nonetheless otherwise I am in a recommend that we continue with the current wound care measures including the compression therapy most likely. We just need to ensure that his leg is indeed free of any DVTs. 12/17/2018 on evaluation today patient actually appears to be completely healed today. He does have 2 very small areas of blistering although this is not anything too significant at this point which is good news. With that being said I am in agreement with the fact that I think he is completely healed at this point. He does want to get back into his compression stocking. The good news is we have gotten approval from insurance for his lymphedema pumps we received a letter since last saw him last week. The other good news is his study did come back and showed no evidence of a DVT. 12/20/2018 on evaluation today patient presents for follow-up concerning his ongoing issues with his left lower extremity. He was actually discharged last Friday and did fairly well until he states blisters opened this morning. He tells me he has been wearing his compression stocking although he has a hard time getting this on. There does not appear to be any signs of active infection at this time. No fevers, chills, nausea, vomiting, or diarrhea. 12/27/2018 on evaluation today patient appears to be doing very well with regard to his swelling of the left lower extremity the 4 layer compression wrap seems to have been beneficial for him. Fortunately there is no signs of active infection at this time. Patient has been tolerating the compression wrap without complication and his foot swelling in particular appears to be greatly improved. He does still have a wound on the lateral portion of his left leg I believe this is more of a blister that has  now reopened. 01/03/2019 on evaluation today patient actually appears to be doing excellent in regard to his left lower extremity. He did [place], [place] ([id number]) receive his compression pumps and is actually use this 7 times since he was last here in the office. On top of the compression wrap he is now roughly 3 cm better at the calf and 2 cm better at the ankle he also states that his foot seem to go an issue better without even having to use a shoe horn. Obviously I think this is all evidence that he is doing excellent in this regard. The other good news is he does not appear to have anything open today as far as wounds are concerned. 01/15/2019 on evaluation today patient appears to be doing more poorly yet again with regard to his left lower extremity. He has developed new wounds again after being discharged just recently. Unfortunately this continues to be the case that he will heal and then have subsequent new  wounds. The last time I was hopeful that he may not end up coming back too quickly especially since he states he has been using his lymphedema pumps along with wearing his compression. Nonetheless he had a blister on the back of his leg that popped up on the left and this has opened up into an ulceration it is quite painful. 01/22/19 on evaluation today patient actually appears to be doing well with regard to his wound on the left lower extremity. He's been tolerating the dressing changes without complication including the compression wrap in the wound appears to be significantly smaller today which is great news. Overall very pleased in this regard. 01/29/2019 on evaluation today patient appears to be doing well with regard to his left posterior lower extremity ulcer. He has been tolerating the dressing changes without complication. This is not completely healed but is getting much closer. We did order a Farrow wrap 4000 for him he has received this and has it with him today although I am  not sure we are quite ready to start him on that as of yet. We are very close. 02/05/2019 on evaluation today patient actually appears to be doing quite well with regard to his left posterior lower extremity ulcer. He still has a very tiny opening remaining but the fortunate thing is he seems to be healing quite nicely. He also did get his Farrow wrap which I am hoping will help with his edema control as well at home. Fortunately there is no evidence of active infection. 02/12/2019 patient and fortunately appears to be doing poorly in regard to his wounds of the left lower extremity. He was very close to healing therefore we attempted to use his Velcro compression wraps continuing with lymphedema pumps at home. Unfortunately that does not seem to have done very well for him. He tells me that he wore them all the time but again I am not sure why if that is the case that he is having such significant edema. He is still on his fluid pills as well. With that being said there is no obvious sign of infection although I do wonder about the possibility of infection at this time as well. 02/19/2019 unfortunately upon evaluation today patient appears to be doing more poorly with regard to his left lower extremity. He is not showing signs of significant improvement and I think the biggest issue here is that he does have an infection that appears to likely be Pseudomonas. That is based on the blue-green drainage that were noted at this time. Unfortunately the antibiotic that has been on is not going to take care of this at all. I think they will get a need to switch him to either Levaquin or Cipro and this was discussed with the patient. 02/26/2019 on evaluation today patient's lower extremity on the left appears to be doing significantly better as compared to last evaluation. Fortunately there is no signs of active infection at this time. He has been tolerating the compression wrap without complication in fact  he made it the whole week at this point. He is showing signs of excellent improvement I am very happy in this regard. With that being said he is having some issues with infection we did review the results of his culture which I noted today. He did have a positive finding for Enterobacter as well as Alcaligenes faecalis. Fortunately the Levaquin that I placed him on will work for both which is great news. There is no signs  of systemic infection at this point. 10/30; left posterior leg wound in the setting of very significant edema and what looks like chronic venous inflammation. He has compression pumps but does not use them. We have been using 3 layer compression. Silver alginate to the wound as the primary dressing Electronic Signature(s) Signed: 03/08/2019 5:42:32 PM By: Linton Ham MD Entered By: Linton Ham on 03/08/2019 10:01:09 [name] ([id number]) -------------------------------------------------------------------------------- Physical Exam Details Patient Name: [name] Date of Service: 03/08/2019 9:00 AM Medical Record Number: [id number] Patient Account Number: [id number] Date of Birth/Sex: [birth year] (68 y.o. M) Treating RN: Montey Hora Primary Care Provider: SYSTEM, PCP Other Clinician: Referring Provider: Referral, Self Treating Provider/Extender: Ricard Dillon Weeks in Treatment: 16 Constitutional Sitting or standing Blood Pressure is within target range for patient.. Pulse regular and within target range for patient.Marland Kitchen Respirations regular, non-labored and within target range.. Temperature is normal and within the target range for the patient.Marland Kitchen appears in no distress. Eyes Conjunctivae clear. No discharge. Respiratory Respiratory effort is easy and symmetric bilaterally. Rate is normal at rest and on room air.. Cardiovascular Even through marked edema I was able to feel the peripheral pulses in his feet. 3+ mostly pitting edema. Integumentary (Hair,  Skin) What looks like marked stasis dermatitis no overt tenderness. Psychiatric No evidence of depression, anxiety, or agitation. Calm, cooperative, and communicative. Appropriate interactions and affect.. Notes Wound exam; patient's wound on the posterior left calf looks really quite healthy. No debridement is necessary. There is no surrounding erythema. Electronic Signature(s) Signed: 03/08/2019 5:42:32 PM By: Linton Ham MD Entered By: Linton Ham on 03/08/2019 10:03:37 [name] ([id number]) -------------------------------------------------------------------------------- Physician Orders Details Patient Name: [name] Date of Service: 03/08/2019 9:00 AM Medical Record Number: [id number] Patient Account Number: [id number] Date of Birth/Sex: 1951/01/14 (68 y.o. M) Treating RN: Montey Hora Primary Care Provider: SYSTEM, PCP Other Clinician: Referring Provider: Referral, Self Treating Provider/Extender: Tito Dine in Treatment: 16 Verbal / Phone Orders: No Diagnosis Coding Wound Cleansing Wound #8 Left,Posterior Lower Leg o May shower with protection. - Please do not get your wrap wet Primary Wound Dressing Wound #8 Left,Posterior Lower Leg o Silver Alginate o Other: - adaptic contact layer Secondary Dressing Wound #8 Left,Posterior Lower Leg o ABD pad o XtraSorb Dressing Change Frequency Wound #8 Left,Posterior Lower Leg o Change dressing every week Follow-up Appointments Wound #8 Left,Posterior Lower Leg o Return Appointment in 1 week. Edema Control Wound #8 Left,Posterior Lower Leg o 4-Layer Compression System - Left Lower Extremity. - anchor with Doctor, hospital) Signed: 03/08/2019 5:07:31 PM By: Montey Hora Signed: 03/08/2019 5:42:32 PM By: Linton Ham MD Entered By: Montey Hora on 03/08/2019 09:40:48 [name]  ([id number]) -------------------------------------------------------------------------------- Problem List Details Patient Name: WALDEMAR, SCHWEPPE Date of Service: 03/08/2019 9:00 AM Medical Record Number: [id number] Patient Account Number: [id number] Date of Birth/Sex: Dec 24, 1950 (68 y.o. M) Treating RN: Montey Hora Primary Care Provider: SYSTEM, PCP Other Clinician: Referring Provider: Referral, Self Treating Provider/Extender: Tito Dine in Treatment: 16 Active Problems ICD-10 Evaluated Encounter Code Description Active Date Today Diagnosis E11.622 Type 2 diabetes mellitus with other skin ulcer 11/12/2018 No Yes I89.0 Lymphedema, not elsewhere classified 11/12/2018 No Yes I87.2 Venous insufficiency (chronic) (peripheral) 11/12/2018 No Yes L97.822 Non-pressure chronic ulcer of other part of left lower leg with 11/12/2018 No Yes fat layer exposed I10 Essential (primary) hypertension 11/12/2018 No Yes Inactive Problems Resolved Problems Electronic Signature(s) Signed: 03/08/2019 5:42:32 PM By: Linton Ham MD Entered By: Linton Ham on 03/08/2019 09:58:42 [name],  [name] ([id number]) -------------------------------------------------------------------------------- Progress Note Details Patient Name: WILHELM, HASSAN Date of Service: 03/08/2019 9:00 AM Medical Record Number: [id number] Patient Account Number: [id number] Date of Birth/Sex: 23-Dec-1950 (68 y.o. M) Treating RN: Montey Hora Primary Care Provider: SYSTEM, PCP Other Clinician: Referring Provider: Referral, Self Treating Provider/Extender: Tito Dine in Treatment: 16 Subjective History of Present Illness (HPI) 68 year old male who presented to the ER with bilateral lower extremity blisters which had started last week. he has a past medical history of leukemia, diabetes mellitus, hypertension, edema of both lower extremities, his recurrent skin infections, peripheral vascular disease, coronary artery  disease, congestive heart failure and peripheral neuropathy. in the ER he was given Rocephin and put on Silvadene cream. he was put on oral doxycycline and was asked to follow-up with the [facility]. His last hemoglobin A1c was 6.6 in December and he checks his blood sugar once a week. He does not have any physicians outside the [place] system. He does not recall any vascular duplex studies done either for arterial or venous disease but was told to wear compression stockings which he does not use 05/30/2016 -- we have not yet received any of his notes from the [facility] hospital system and his arterial and venous duplex studies are scheduled here in [place] around mid February. We are unable to have his insurance accepted by home health agencies and hence he is getting dressings only once a week. 06/06/16 -- -- I received a call from the patient's PCP at the [facility] at [facility] and spoke to Dr. Garvin Fila, phone number [phone] and fax number [phone]. She confirmed that no vascular testing was done over the last 5 years and she would be happy to do them if the patient did want them to be done at the [place] and we could fax him a request. Readmission: 68 year old male seen by as in February of this year and was referred to vein and vascular for studies and opinion from the vascular surgeons. The patient returns today with a fresh problem having had blisters on his left lower extremity which have been there for about 5 days and he clearly states that he has been wearing his compression stockings as advised though he could not read the moderate compression and has been wearing light compression. Review of his electronic medical records note that he had lower extremity arterial duplex examination done on 06/23/2016 which showed no hemodynamically significant stenosis in the bilateral lower extremity arterial system. He also had a lower extremity venous reflux examination done on 07/07/2016 and it was noted  that he had venous incompetence in the right great saphenous vein and bilateral common femoral veins. Patient was seen by Dr. Tamala Julian on the same day and for some reason his notes do not reflect the venous studies or the arterial studies and he recommended patient do a venous duplex ultrasound to look for reflux and return to see him.he would also consider a lymph pump if required. The patient was told that his workup was normal and hence the patient canceled his follow-up appointment. 02/03/17 on evaluation today patient left medial lower extremity blister appears to be doing about the same. It is still continuing to drain and there's still the blistered skin covering the wound bed which is making it difficult for the alternate to do its job. Fortunately there is no evidence of cellulitis. No fevers chills noted. Patient states in general he is not having any significant discomfort. Patient's lower extremity arterial duplex exam revealed that  patient was hemodynamically stable with no evidence of stenosis in regard to the bilateral lower extremities. The lower extremity venous reflux exam revealed the patient had venous incontinence noted in the right greater saphenous and bilateral common femoral vein. There is no evidence of deep or superficial vein thrombosis in the bilateral lower extremities. Readmission: BRELYN, GORRIE ([id number]) 11/12/18 Patient presents for evaluation our clinic today concerning issues that he is having with his left lower extremity. He tells me that a couple weeks ago he began developing blisters on the left lower extremity along with increased swelling. He typically wears his compression stockings on a regular basis is previously been evaluated both here as well is with vascular surgery they would recommend lymphedema pumps but unfortunately that somehow fell through and he never heard anything back from that. Nonetheless I think lymphedema pumps would be beneficial for  this patient. He does have a history of hypertension and diabetes. Obviously the chronic venous stasis and lymphedema as well. At this point the blisters have been given in more trouble he states sometimes when the blisters openings able to clean it down with alcohol and it will dry out and do well. Unfortunately that has not been the case this time. He is having some discomfort although this mean these with cleaning the areas he doesn't have discomfort just on a regular basis. He has not been able to wear his compression stockings since the blisters arose due to the fact that of course it will drain into the socks causing additional issues and he didn't have any way to wrap this otherwise. He has increased to taking his Lasix every day instead of every other day. He sees his primary care provider later this month as well. No fevers, chills, nausea, or vomiting noted at this time. 11/19/18-Patient returns at 1 week, per intake RN the amount of seepage into the compression wraps was definitely improved, overall all the wounds are measuring smaller but continuing silver alginate to the wounds as primary dressing 11/26/18 on evaluation today patient appears to be doing quite well in regard to his left lower [place] ulcers. In fact of the areas that were noted initially he only has two regions still open. There is no evidence of active infection at this time. He still is not heard anything from the company regarding lymphedema pumps as of yet. Again as previously seen vascular they have not recommended any surgical intervention. 12/03/2018 on evaluation today patient actually appears to be doing quite well with regard to his lower extremity ulcers. In fact most of the areas appear to be healed the one spot which does not seem to be completely healed I am unsure of whether or not this is really draining that much but nonetheless there does not appear to be any signs of infection or significant drainage at  this point. There is no sign of fever, chills, nausea, vomiting, or diarrhea. Overall I am pleased with how things have progressed I think is very close to being able to transition to his home compression stockings. 12/10/2018 upon evaluation today patient appears to be doing quite well with regard to his left lower extremity. He has been tolerating the dressing changes without complication. Fortunately there is no signs of active infection at this time. He appears after thorough evaluation of his leg to only have 1 small area that remains open at this point everything else appears to be almost completely closed. He still have significant swelling of the left lower extremity. We  had discussed discussing this with his primary care provider he is not able to see her in person they were at the [facility] and right now the [place] is not seeing patients on site. According to the patient anyway. Subsequently he did speak with her apparently and his primary care provider feels that he may likely have a DVT. With that being said she has not seen his leg she is just going off of his history. Nonetheless that is a concern that the patient now has as well and while I do not feel the DVT is likely we can definitely ensure that that is not the case I will go ahead and see about putting that order in today. Nonetheless otherwise I am in a recommend that we continue with the current wound care measures including the compression therapy most likely. We just need to ensure that his leg is indeed free of any DVTs. 12/17/2018 on evaluation today patient actually appears to be completely healed today. He does have 2 very small areas of blistering although this is not anything too significant at this point which is good news. With that being said I am in agreement with the fact that I think he is completely healed at this point. He does want to get back into his compression stocking. The good news is we have gotten approval  from insurance for his lymphedema pumps we received a letter since last saw him last week. The other good news is his study did come back and showed no evidence of a DVT. 12/20/2018 on evaluation today patient presents for follow-up concerning his ongoing issues with his left lower extremity. He was actually discharged last Friday and did fairly well until he states blisters opened this morning. He tells me he has been wearing his compression stocking although he has a hard time getting this on. There does not appear to be any signs of active infection at this time. No fevers, chills, nausea, vomiting, or diarrhea. 12/27/2018 on evaluation today patient appears to be doing very well with regard to his swelling of the left lower extremity the 4 layer compression wrap seems to have been beneficial for him. Fortunately there is no signs of active infection at this time. Patient has been tolerating the compression wrap without complication and his foot swelling in particular appears to be greatly improved. He does still have a wound on the lateral portion of his left leg I believe this is more of a blister that has now reopened. 01/03/2019 on evaluation today patient actually appears to be doing excellent in regard to his left lower extremity. He did receive his compression pumps and is actually use this 7 times since he was last here in the office. On top of the compression wrap he is now roughly 3 cm better at the calf and 2 cm better at the ankle he also states that his foot seem to go an issue better without even having to use a shoe horn. Obviously I think this is all evidence that he is doing excellent in this regard. The other good news is he does not appear to have anything open today as far as wounds are concerned. REYCE, DRACHENBERG ([id number]) 01/15/2019 on evaluation today patient appears to be doing more poorly yet again with regard to his left lower extremity. He has developed new wounds again  after being discharged just recently. Unfortunately this continues to be the case that he will heal and then have subsequent new wounds. The  last time I was hopeful that he may not end up coming back too quickly especially since he states he has been using his lymphedema pumps along with wearing his compression. Nonetheless he had a blister on the back of his leg that popped up on the left and this has opened up into an ulceration it is quite painful. 01/22/19 on evaluation today patient actually appears to be doing well with regard to his wound on the left lower extremity. He's been tolerating the dressing changes without complication including the compression wrap in the wound appears to be significantly smaller today which is great news. Overall very pleased in this regard. 01/29/2019 on evaluation today patient appears to be doing well with regard to his left posterior lower extremity ulcer. He has been tolerating the dressing changes without complication. This is not completely healed but is getting much closer. We did order a Farrow wrap 4000 for him he has received this and has it with him today although I am not sure we are quite ready to start him on that as of yet. We are very close. 02/05/2019 on evaluation today patient actually appears to be doing quite well with regard to his left posterior lower extremity ulcer. He still has a very tiny opening remaining but the fortunate thing is he seems to be healing quite nicely. He also did get his Farrow wrap which I am hoping will help with his edema control as well at home. Fortunately there is no evidence of active infection. 02/12/2019 patient and fortunately appears to be doing poorly in regard to his wounds of the left lower extremity. He was very close to healing therefore we attempted to use his Velcro compression wraps continuing with lymphedema pumps at home. Unfortunately that does not seem to have done very well for him. He tells me that  he wore them all the time but again I am not sure why if that is the case that he is having such significant edema. He is still on his fluid pills as well. With that being said there is no obvious sign of infection although I do wonder about the possibility of infection at this time as well. 02/19/2019 unfortunately upon evaluation today patient appears to be doing more poorly with regard to his left lower extremity. He is not showing signs of significant improvement and I think the biggest issue here is that he does have an infection that appears to likely be Pseudomonas. That is based on the blue-green drainage that were noted at this time. Unfortunately the antibiotic that has been on is not going to take care of this at all. I think they will get a need to switch him to either Levaquin or Cipro and this was discussed with the patient. 02/26/2019 on evaluation today patient's lower extremity on the left appears to be doing significantly better as compared to last evaluation. Fortunately there is no signs of active infection at this time. He has been tolerating the compression wrap without complication in fact he made it the whole week at this point. He is showing signs of excellent improvement I am very happy in this regard. With that being said he is having some issues with infection we did review the results of his culture which I noted today. He did have a positive finding for Enterobacter as well as Alcaligenes faecalis. Fortunately the Levaquin that I placed him on will work for both which is great news. There is no signs of systemic infection  at this point. 10/30; left posterior leg wound in the setting of very significant edema and what looks like chronic venous inflammation. He has compression pumps but does not use them. We have been using 3 layer compression. Silver alginate to the wound as the primary dressing Objective Constitutional Sitting or standing Blood Pressure is within  target range for patient.. Pulse regular and within target range for patient.Marland Kitchen Respirations regular, non-labored and within target range.. Temperature is normal and within the target range for the patient.Marland Kitchen appears in no distress. Vitals Time Taken: 9:00 AM, Height: 73 in, Weight: 300 lbs, BMI: 39.6, Temperature: 98.3 F, Pulse: 67 bpm, Respiratory Wingerter, Marselino ([id number]) Rate: 20 breaths/min, Blood Pressure: 119/51 mmHg. Eyes Conjunctivae clear. No discharge. Respiratory Respiratory effort is easy and symmetric bilaterally. Rate is normal at rest and on room air.. Cardiovascular Even through marked edema I was able to feel the peripheral pulses in his feet. 3+ mostly pitting edema. Psychiatric No evidence of depression, anxiety, or agitation. Calm, cooperative, and communicative. Appropriate interactions and affect.. General Notes: Wound exam; patient's wound on the posterior left calf looks really quite healthy. No debridement is necessary. There is no surrounding erythema. Integumentary (Hair, Skin) What looks like marked stasis dermatitis no overt tenderness. Wound #8 status is Open. Original cause of wound was Blister. The wound is located on the Left,Posterior Lower Leg. The wound measures 1.5cm length x 4.4cm width x 0.1cm depth; 5.184cm^2 area and 0.518cm^3 volume. There is Fat Layer (Subcutaneous Tissue) Exposed exposed. There is no tunneling or undermining noted. There is a large amount of serous drainage noted. The wound margin is flat and intact. There is large (67-100%) red granulation within the wound bed. There is a small (1-33%) amount of necrotic tissue within the wound bed including Adherent Slough. Assessment Active Problems ICD-10 Type 2 diabetes mellitus with other skin ulcer Lymphedema, not elsewhere classified Venous insufficiency (chronic) (peripheral) Non-pressure chronic ulcer of other part of left lower leg with fat layer exposed Essential (primary)  hypertension Procedures Wound #8 Pre-procedure diagnosis of Wound #8 is a Diabetic Wound/Ulcer of the Lower Extremity located on the Left,Posterior Lower Leg . There was a Four Layer Compression Therapy Procedure with a pre-treatment ABI of 1.5 by Montey Hora, RN. Post procedure Diagnosis Wound #8: Same as Pre-Procedure DA, BEISEL ([id number]) Plan Wound Cleansing: Wound #8 Left,Posterior Lower Leg: May shower with protection. - Please do not get your wrap wet Primary Wound Dressing: Wound #8 Left,Posterior Lower Leg: Silver Alginate Other: - adaptic contact layer Secondary Dressing: Wound #8 Left,Posterior Lower Leg: ABD pad XtraSorb Dressing Change Frequency: Wound #8 Left,Posterior Lower Leg: Change dressing every week Follow-up Appointments: Wound #8 Left,Posterior Lower Leg: Return Appointment in 1 week. Edema Control: Wound #8 Left,Posterior Lower Leg: 4-Layer Compression System - Left Lower Extremity. - anchor with unna #1 I put silver alginate on this again but put him in a 4-layer compression. Although I do not see any formal arterial studies there is a palpable dorsalis pedis pulse even through the 3+ pitting edema. 2. Lymphedema is usually not pitting. There was no signs of systemic fluid volume overload that I could pick up on. This does not extend above his knee. I agree with going back to see vascular with regards to venous insufficiency Electronic Signature(s) Signed: 03/08/2019 5:42:32 PM By: Linton Ham MD Entered By: Linton Ham on 03/08/2019 10:05:00 [name] ([id number]) -------------------------------------------------------------------------------- [place] Details Patient Name: [name] Date of Service: 03/08/2019 Medical Record Number: [id number] Patient Account  Number: [id number] Date of Birth/Sex: [birth year] (68 y.o. M) Treating RN: Montey Hora Primary Care Provider: SYSTEM, PCP Other Clinician: Referring Provider:  Referral, Self Treating Provider/Extender: Tito Dine in Treatment: 16 Diagnosis Coding ICD-10 Codes Code Description E11.622 Type 2 diabetes mellitus with other skin ulcer I89.0 Lymphedema, not elsewhere classified I87.2 Venous insufficiency (chronic) (peripheral) L97.822 Non-pressure chronic ulcer of other part of left lower leg with fat layer exposed I10 Essential (primary) hypertension Facility Procedures CPT4 Code: [id number] Description: (Facility Use Only) [phone] - [place] [id number] LWR LT LEG Modifier: Quantity: 1 Physician Procedures CPT4 Code Description: [id number] - WC PHYS LEVEL 3 - EST PT ICD-10 Diagnosis Description L97.822 Non-pressure chronic ulcer of other part of left lower leg wit I87.2 Venous insufficiency (chronic) (peripheral) Modifier: h fat layer expos Quantity: 1 ed Electronic Signature(s) Signed: 03/08/2019 5:42:32 PM By: Linton Ham MD Entered By: Linton Ham on 03/08/2019 10:05:23

## 2019-03-18 ENCOUNTER — Other Ambulatory Visit: Payer: Self-pay

## 2019-03-18 ENCOUNTER — Encounter: Payer: PPO | Attending: Physician Assistant | Admitting: Physician Assistant

## 2019-03-18 DIAGNOSIS — E1142 Type 2 diabetes mellitus with diabetic polyneuropathy: Secondary | ICD-10-CM | POA: Diagnosis not present

## 2019-03-18 DIAGNOSIS — Z9221 Personal history of antineoplastic chemotherapy: Secondary | ICD-10-CM | POA: Insufficient documentation

## 2019-03-18 DIAGNOSIS — I10 Essential (primary) hypertension: Secondary | ICD-10-CM | POA: Insufficient documentation

## 2019-03-18 DIAGNOSIS — Z87891 Personal history of nicotine dependence: Secondary | ICD-10-CM | POA: Insufficient documentation

## 2019-03-18 DIAGNOSIS — E11622 Type 2 diabetes mellitus with other skin ulcer: Secondary | ICD-10-CM | POA: Diagnosis not present

## 2019-03-18 DIAGNOSIS — I2510 Atherosclerotic heart disease of native coronary artery without angina pectoris: Secondary | ICD-10-CM | POA: Insufficient documentation

## 2019-03-18 DIAGNOSIS — L97822 Non-pressure chronic ulcer of other part of left lower leg with fat layer exposed: Secondary | ICD-10-CM | POA: Insufficient documentation

## 2019-03-18 DIAGNOSIS — I890 Lymphedema, not elsewhere classified: Secondary | ICD-10-CM | POA: Insufficient documentation

## 2019-03-18 DIAGNOSIS — I872 Venous insufficiency (chronic) (peripheral): Secondary | ICD-10-CM | POA: Diagnosis not present

## 2019-03-18 DIAGNOSIS — L97222 Non-pressure chronic ulcer of left calf with fat layer exposed: Secondary | ICD-10-CM | POA: Diagnosis not present

## 2019-03-18 DIAGNOSIS — Z856 Personal history of leukemia: Secondary | ICD-10-CM | POA: Insufficient documentation

## 2019-03-18 NOTE — Progress Notes (Addendum)
CEASAR, CLINKENBEARD ([id number]) Visit Report for 03/18/2019 Chief Complaint Document Details Patient Name: [name] Date of Service: 03/18/2019 9:00 AM Medical Record Number: [id number] Patient Account Number: [id number] Date of Birth/Sex: November 05, 1950 (68 y.o. M) Treating RN: [name] Primary Care Provider: SYSTEM, Mooney Other Clinician: Referring Provider: Referral, Self Treating Provider/Extender: [name], [name] in Treatment: 18 Information Obtained from: Patient Chief Complaint Left LE ulcers Electronic Signature(s) Signed: 03/18/2019 8:55:00 AM By: [name] Keeler PA-C Entered By: [name] on 03/18/2019 08:55:00 [name] ([id number]) -------------------------------------------------------------------------------- HPI Details Patient Name: [name] Date of Service: 03/18/2019 9:00 AM Medical Record Number: [id number] Patient Account Number: [id number] Date of Birth/Sex: 1950-07-11 (68 y.o. M) Treating RN: [name] Primary Care Provider: SYSTEM, Mooney Other Clinician: Referring Provider: Referral, Self Treating Provider/Extender: [name] Mooney in Treatment: 18 History of Present Illness HPI Description: 68 year old male who presented to the ER with bilateral lower extremity blisters which had started last week. he has a past medical history of leukemia, diabetes mellitus, hypertension, edema of both lower extremities, his recurrent skin infections, peripheral vascular disease, coronary artery disease, congestive heart failure and peripheral neuropathy. in the ER he was given Rocephin and put on Silvadene cream. he was put on oral doxycycline and was asked to follow-up with the [facility]. His last hemoglobin A1c was 6.6 in December and he checks his blood sugar once a week. He does not have any physicians outside the [place] system. He does not recall any vascular duplex studies done either for arterial or venous disease but was told to wear  compression stockings which he does not use 05/30/2016 -- we have not yet received any of his notes from the [facility] hospital system and his arterial and venous duplex studies are scheduled here in [place] around mid February. We are unable to have his insurance accepted by home health agencies and hence he is getting dressings only once a week. 06/06/16 -- -- I received a call from the patient's Mooney at the [facility] at [facility] and spoke to [name], phone number [phone] and fax number [phone]. She confirmed that no vascular testing was done over the last 5 years and she would be happy to do them if the patient did want them to be done at the [place] and we could fax him a request. Readmission: 68 year old male seen by as in February of this year and was referred to vein and vascular for studies and opinion from the vascular surgeons. The patient returns today with a fresh problem having had blisters on his left lower extremity which have been there for about 5 days and he clearly states that he has been wearing his compression stockings as advised though he could not read the moderate compression and has been wearing light compression. Review of his electronic medical records note that he had lower extremity arterial duplex examination done on 06/23/2016 which showed no hemodynamically significant stenosis in the bilateral lower extremity arterial system. He also had a lower extremity venous reflux examination done on 07/07/2016 and it was noted that he had venous incompetence in the right great saphenous vein and bilateral common femoral veins. Patient was seen by [name] on the same day and for some reason his notes do not reflect the venous studies or the arterial studies and he recommended patient do a venous duplex ultrasound to look for reflux and return to see him.he would also consider a lymph pump if required. The patient was told that  his workup was normal and hence the  patient canceled his follow-up appointment. 02/03/17 on evaluation today patient left medial lower extremity blister appears to be doing about the same. It is still continuing to drain and there's still the blistered skin covering the wound bed which is making it difficult for the alternate to do its job. Fortunately there is no evidence of cellulitis. No fevers chills noted. Patient states in general he is not having any significant discomfort. Patient's lower extremity arterial duplex exam revealed that patient was hemodynamically stable with no evidence of stenosis in regard to the bilateral lower extremities. The lower extremity venous reflux exam revealed the patient had venous incontinence noted in the right greater saphenous and bilateral common femoral vein. There is no evidence of deep or superficial vein thrombosis in the bilateral lower extremities. Readmission: [name], [name] ([id number]) 11/12/18 Patient presents for evaluation our clinic today concerning issues that he is having with his left lower extremity. He tells me that a couple Mooney ago he began developing blisters on the left lower extremity along with increased swelling. He typically wears his compression stockings on a regular basis is previously been evaluated both here as well is with vascular surgery they would recommend lymphedema pumps but unfortunately that somehow fell through and he never heard anything back from that. Nonetheless I think lymphedema pumps would be beneficial for this patient. He does have a history of hypertension and diabetes. Obviously the chronic venous stasis and lymphedema as well. At this point the blisters have been given in more trouble he states sometimes when the blisters openings able to clean it down with alcohol and it will dry out and do well. Unfortunately that has not been the case this time. He is having some discomfort although this mean these with cleaning the areas he doesn't have  discomfort just on a regular basis. He has not been able to wear his compression stockings since the blisters arose due to the fact that of course it will drain into the socks causing additional issues and he didn't have any way to wrap this otherwise. He has increased to taking his Lasix every day instead of every other day. He sees his primary care provider later this month as well. No fevers, chills, nausea, or vomiting noted at this time. 11/19/18-Patient returns at 1 week, per intake RN the amount of seepage into the compression wraps was definitely improved, overall all the wounds are measuring smaller but continuing silver alginate to the wounds as primary dressing 11/26/18 on evaluation today patient appears to be doing quite well in regard to his left lower [place] ulcers. In fact of the areas that were noted initially he only has two regions still open. There is no evidence of active infection at this time. He still is not heard anything from the company regarding lymphedema pumps as of yet. Again as previously seen vascular they have not recommended any surgical intervention. 12/03/2018 on evaluation today patient actually appears to be doing quite well with regard to his lower extremity ulcers. In fact most of the areas appear to be healed the one spot which does not seem to be completely healed I am unsure of whether or not this is really draining that much but nonetheless there does not appear to be any signs of infection or significant drainage at this point. There is no sign of fever, chills, nausea, vomiting, or diarrhea. Overall I am pleased with how things have progressed I think is very close  to being able to transition to his home compression stockings. 12/10/2018 upon evaluation today patient appears to be doing quite well with regard to his left lower extremity. He has been tolerating the dressing changes without complication. Fortunately there is no signs of active infection at  this time. He appears after thorough evaluation of his leg to only have 1 small area that remains open at this point everything else appears to be almost completely closed. He still have significant swelling of the left lower extremity. We had discussed discussing this with his primary care provider he is not able to see her in person they were at the [facility] and right now the [place] is not seeing patients on site. According to the patient anyway. Subsequently he did speak with her apparently and his primary care provider feels that he may likely have a DVT. With that being said she has not seen his leg she is just going off of his history. Nonetheless that is a concern that the patient now has as well and while I do not feel the DVT is likely we can definitely ensure that that is not the case I will go ahead and see about putting that order in today. Nonetheless otherwise I am in a recommend that we continue with the current wound care measures including the compression therapy most likely. We just need to ensure that his leg is indeed free of any DVTs. 12/17/2018 on evaluation today patient actually appears to be completely healed today. He does have 2 very small areas of blistering although this is not anything too significant at this point which is good news. With that being said I am in agreement with the fact that I think he is completely healed at this point. He does want to get back into his compression stocking. The good news is we have gotten approval from insurance for his lymphedema pumps we received a letter since last saw him last week. The other good news is his study did come back and showed no evidence of a DVT. 12/20/2018 on evaluation today patient presents for follow-up concerning his ongoing issues with his left lower extremity. He was actually discharged last Friday and did fairly well until he states blisters opened this morning. He tells me he has been wearing his compression  stocking although he has a hard time getting this on. There does not appear to be any signs of active infection at this time. No fevers, chills, nausea, vomiting, or diarrhea. 12/27/2018 on evaluation today patient appears to be doing very well with regard to his swelling of the left lower extremity the 4 layer compression wrap seems to have been beneficial for him. Fortunately there is no signs of active infection at this time. Patient has been tolerating the compression wrap without complication and his foot swelling in particular appears to be greatly improved. He does still have a wound on the lateral portion of his left leg I believe this is more of a blister that has now reopened. 01/03/2019 on evaluation today patient actually appears to be doing excellent in regard to his left lower extremity. He did receive his compression pumps and is actually use this 7 times since he was last here in the office. On top of the compression wrap he is now roughly 3 cm better at the calf and 2 cm better at the ankle he also states that his foot seem to go an issue better without even having to use a shoe horn.  Obviously I think this is all evidence that he is doing excellent in this regard. The other good news is he does not appear to have anything open today as far as wounds are concerned. [name], [name] ([id number]) 01/15/2019 on evaluation today patient appears to be doing more poorly yet again with regard to his left lower extremity. He has developed new wounds again after being discharged just recently. Unfortunately this continues to be the case that he will heal and then have subsequent new wounds. The last time I was hopeful that he may not end up coming back too quickly especially since he states he has been using his lymphedema pumps along with wearing his compression. Nonetheless he had a blister on the back of his leg that popped up on the left and this has opened up into an ulceration it is quite  painful. 01/22/19 on evaluation today patient actually appears to be doing well with regard to his wound on the left lower extremity. He's been tolerating the dressing changes without complication including the compression wrap in the wound appears to be significantly smaller today which is great news. Overall very pleased in this regard. 01/29/2019 on evaluation today patient appears to be doing well with regard to his left posterior lower extremity ulcer. He has been tolerating the dressing changes without complication. This is not completely healed but is getting much closer. We did order a Farrow wrap 4000 for him he has received this and has it with him today although I am not sure we are quite ready to start him on that as of yet. We are very close. 02/05/2019 on evaluation today patient actually appears to be doing quite well with regard to his left posterior lower extremity ulcer. He still has a very tiny opening remaining but the fortunate thing is he seems to be healing quite nicely. He also did get his Farrow wrap which I am hoping will help with his edema control as well at home. Fortunately there is no evidence of active infection. 02/12/2019 patient and fortunately appears to be doing poorly in regard to his wounds of the left lower extremity. He was very close to healing therefore we attempted to use his Velcro compression wraps continuing with lymphedema pumps at home. Unfortunately that does not seem to have done very well for him. He tells me that he wore them all the time but again I am not sure why if that is the case that he is having such significant edema. He is still on his fluid pills as well. With that being said there is no obvious sign of infection although I do wonder about the possibility of infection at this time as well. 02/19/2019 unfortunately upon evaluation today patient appears to be doing more poorly with regard to his left lower extremity. He is not showing signs  of significant improvement and I think the biggest issue here is that he does have an infection that appears to likely be Pseudomonas. That is based on the blue-green drainage that were noted at this time. Unfortunately the antibiotic that has been on is not going to take care of this at all. I think they will get a need to switch him to either Levaquin or Cipro and this was discussed with the patient. 02/26/2019 on evaluation today patient's lower extremity on the left appears to be doing significantly better as compared to last evaluation. Fortunately there is no signs of active infection at this time. He has been tolerating the  compression wrap without complication in fact he made it the whole week at this point. He is showing signs of excellent improvement I am very happy in this regard. With that being said he is having some issues with infection we did review the results of his culture which I noted today. He did have a positive finding for Enterobacter as well as Alcaligenes faecalis. Fortunately the Levaquin that I placed him on will work for both which is great news. There is no signs of systemic infection at this point. 10/30; left posterior leg wound in the setting of very significant edema and what looks like chronic venous inflammation. He has compression pumps but does not use them. We have been using 3 layer compression. Silver alginate to the wound as the primary dressing 03/18/2019 on evaluation today patient appears to be doing a little better compared to last time I saw him. He really has not been using his compression pumps he tells me that he is having too much discomfort. He has been keeping his wraps on however. He is only been taking his fluid pills every other day because he states they are not really helping and he has an appointment with his primary care provider at the [facility] tomorrow. Subsequently the wound itself on the left lower extremity does seem to be greatly improved  compared to previous. Electronic Signature(s) Signed: 03/18/2019 9:39:49 AM By: [name] Keeler PA-C Entered By: [name] on 03/18/2019 09:39:48 [name] ([id number]) -------------------------------------------------------------------------------- Physical Exam Details Patient Name: [name] Date of Service: 03/18/2019 9:00 AM Medical Record Number: [id number] Patient Account Number: [id number] Date of Birth/Sex: [birth year] (68 y.o. M) Treating RN: [name] Primary Care Provider: SYSTEM, Mooney Other Clinician: Referring Provider: Referral, Self Treating Provider/Extender: [name] III, Dequan Kindred Mooney in Treatment: 64 Constitutional Well-nourished and well-hydrated in no acute distress. Respiratory normal breathing without difficulty. clear to auscultation bilaterally. Cardiovascular regular rate and rhythm with normal S1, S2. Patient does have bilateral stage III lymphedema.Marland Kitchen Psychiatric this patient is able to make decisions and demonstrates good insight into disease process. Alert and Oriented x 3. pleasant and cooperative. Notes Patient's wound bed currently showed signs of good granulation at this time. Fortunately there is improvement in the overall size of the wound although unfortunately the patient still is having a lot of issues with edema control he has significant edema which we have really not been able to do a sufficient amount of improvement concerning unfortunately. He seems to be very frustrated with the situation which I completely understand. Electronic Signature(s) Signed: 03/18/2019 9:40:31 AM By: [name] Keeler PA-C Entered By: [name] on 03/18/2019 09:40:31 [name] ([id number]) -------------------------------------------------------------------------------- Physician Orders Details Patient Name: [name] Date of Service: 03/18/2019 9:00 AM Medical Record Number: [id number] Patient Account Number: [id number] Date of Birth/Sex:  01-28-1951 (68 y.o. M) Treating RN: [name] Primary Care Provider: SYSTEM, Mooney Other Clinician: Referring Provider: Referral, Self Treating Provider/Extender: [name] III, Xavyer Steenson Mooney in Treatment: 18 Verbal / Phone Orders: No Diagnosis Coding ICD-10 Coding Code Description E11.622 Type 2 diabetes mellitus with other skin ulcer I89.0 Lymphedema, not elsewhere classified I87.2 Venous insufficiency (chronic) (peripheral) L97.822 Non-pressure chronic ulcer of other part of left lower leg with fat layer exposed I10 Essential (primary) hypertension Wound Cleansing Wound #8 Left,Posterior Lower Leg o May shower with protection. - Please do not get your wrap wet Primary Wound Dressing Wound #8 Left,Posterior Lower Leg o Silver Alginate o Other: - adaptic contact layer Secondary Dressing Wound #  8 Left,Posterior Lower Leg o ABD pad o XtraSorb Dressing Change Frequency Wound #8 Left,Posterior Lower Leg o Change dressing every week Follow-up Appointments Wound #8 Left,Posterior Lower Leg o Return Appointment in 1 week. Edema Control Wound #8 Left,Posterior Lower Leg o 4 Layer Compression System - Bilateral - unna to anchor Consults o Vascular - Consult Electronic Signature(s) Signed: 03/18/2019 11:27:10 AM By: Irean Hong [place], [place] ([id number]) Signed: 03/18/2019 4:23:10 PM By: [name] Entered By: [name] on 03/18/2019 09:29:11 [name] ([id number]) -------------------------------------------------------------------------------- Problem List Details Patient Name: [name] Date of Service: 03/18/2019 9:00 AM Medical Record Number: [id number] Patient Account Number: [id number] Date of Birth/Sex: 01/31/1951 (68 y.o. M) Treating RN: [name] Primary Care Provider: SYSTEM, Mooney Other Clinician: Referring Provider: Referral, Self Treating Provider/Extender: [name], Yaqub Arney Mooney in Treatment: 18 Active  Problems ICD-10 Evaluated Encounter Code Description Active Date Today Diagnosis E11.622 Type 2 diabetes mellitus with other skin ulcer 11/12/2018 No Yes I89.0 Lymphedema, not elsewhere classified 11/12/2018 No Yes I87.2 Venous insufficiency (chronic) (peripheral) 11/12/2018 No Yes L97.822 Non-pressure chronic ulcer of other part of left lower leg with 11/12/2018 No Yes fat layer exposed I10 Essential (primary) hypertension 11/12/2018 No Yes Inactive Problems Resolved Problems Electronic Signature(s) Signed: 03/18/2019 8:54:55 AM By: [name] Keeler PA-C Entered By: [name] on 03/18/2019 08:54:55 [name] ([id number]) -------------------------------------------------------------------------------- Progress Note Details Patient Name: [name] Date of Service: 03/18/2019 9:00 AM Medical Record Number: [id number] Patient Account Number: [id number] Date of Birth/Sex: 27-Sep-1950 (68 y.o. M) Treating RN: [name] Primary Care Provider: SYSTEM, Mooney Other Clinician: Referring Provider: Referral, Self Treating Provider/Extender: [name] III, Mitch Arquette Mooney in Treatment: 18 Subjective Chief Complaint Information obtained from Patient Left LE ulcers History of Present Illness (HPI) 68 year old male who presented to the ER with bilateral lower extremity blisters which had started last week. he has a past medical history of leukemia, diabetes mellitus, hypertension, edema of both lower extremities, his recurrent skin infections, peripheral vascular disease, coronary artery disease, congestive heart failure and peripheral neuropathy. in the ER he was given Rocephin and put on Silvadene cream. he was put on oral doxycycline and was asked to follow-up with the [facility]. His last hemoglobin A1c was 6.6 in December and he checks his blood sugar once a week. He does not have any physicians outside the [place] system. He does not recall any vascular duplex studies done either for arterial or  venous disease but was told to wear compression stockings which he does not use 05/30/2016 -- we have not yet received any of his notes from the [facility] hospital system and his arterial and venous duplex studies are scheduled here in [place] around mid February. We are unable to have his insurance accepted by home health agencies and hence he is getting dressings only once a week. 06/06/16 -- -- I received a call from the patient's Mooney at the [facility] and spoke to [name], phone number [phone] and fax number [phone]. She confirmed that no vascular testing was done over the last 5 years and she would be happy to do them if the patient did want them to be done at the [place] and we could fax him a request. Readmission: 68 year old male seen by as in February of this year and was referred to vein and vascular for studies and opinion from the vascular surgeons. The patient returns today with a fresh problem having had blisters on his left lower extremity which have been there  for about 5 days and he clearly states that he has been wearing his compression stockings as advised though he could not read the moderate compression and has been wearing light compression. Review of his electronic medical records note that he had lower extremity arterial duplex examination done on 06/23/2016 which showed no hemodynamically significant stenosis in the bilateral lower extremity arterial system. He also had a lower extremity venous reflux examination done on 07/07/2016 and it was noted that he had venous incompetence in the right great saphenous vein and bilateral common femoral veins. Patient was seen by [name] on the same day and for some reason his notes do not reflect the venous studies or the arterial studies and he recommended patient do a venous duplex ultrasound to look for reflux and return to see him.he would also consider a lymph pump if required. The patient was told that his  workup was normal and hence the patient canceled his follow-up appointment. 02/03/17 on evaluation today patient left medial lower extremity blister appears to be doing about the same. It is still continuing to drain and there's still the blistered skin covering the wound bed which is making it difficult for the alternate to do its job. Fortunately there is no evidence of cellulitis. No fevers chills noted. Patient states in general he is not having any significant discomfort. Patient's lower extremity arterial duplex exam revealed that patient was hemodynamically stable with no evidence of stenosis in regard to the bilateral lower extremities. The lower extremity venous reflux exam revealed the patient had venous incontinence noted in the right greater saphenous [place], [name] ([id number]) and bilateral common femoral vein. There is no evidence of deep or superficial vein thrombosis in the bilateral lower extremities. Readmission: 11/12/18 Patient presents for evaluation our clinic today concerning issues that he is having with his left lower extremity. He tells me that a couple Mooney ago he began developing blisters on the left lower extremity along with increased swelling. He typically wears his compression stockings on a regular basis is previously been evaluated both here as well is with vascular surgery they would recommend lymphedema pumps but unfortunately that somehow fell through and he never heard anything back from that. Nonetheless I think lymphedema pumps would be beneficial for this patient. He does have a history of hypertension and diabetes. Obviously the chronic venous stasis and lymphedema as well. At this point the blisters have been given in more trouble he states sometimes when the blisters openings able to clean it down with alcohol and it will dry out and do well. Unfortunately that has not been the case this time. He is having some discomfort although this mean these with  cleaning the areas he doesn't have discomfort just on a regular basis. He has not been able to wear his compression stockings since the blisters arose due to the fact that of course it will drain into the socks causing additional issues and he didn't have any way to wrap this otherwise. He has increased to taking his Lasix every day instead of every other day. He sees his primary care provider later this month as well. No fevers, chills, nausea, or vomiting noted at this time. 11/19/18-Patient returns at 1 week, per intake RN the amount of seepage into the compression wraps was definitely improved, overall all the wounds are measuring smaller but continuing silver alginate to the wounds as primary dressing 11/26/18 on evaluation today patient appears to be doing quite well in regard to his left  lower [place] ulcers. In fact of the areas that were noted initially he only has two regions still open. There is no evidence of active infection at this time. He still is not heard anything from the company regarding lymphedema pumps as of yet. Again as previously seen vascular they have not recommended any surgical intervention. 12/03/2018 on evaluation today patient actually appears to be doing quite well with regard to his lower extremity ulcers. In fact most of the areas appear to be healed the one spot which does not seem to be completely healed I am unsure of whether or not this is really draining that much but nonetheless there does not appear to be any signs of infection or significant drainage at this point. There is no sign of fever, chills, nausea, vomiting, or diarrhea. Overall I am pleased with how things have progressed I think is very close to being able to transition to his home compression stockings. 12/10/2018 upon evaluation today patient appears to be doing quite well with regard to his left lower extremity. He has been tolerating the dressing changes without complication. Fortunately there is  no signs of active infection at this time. He appears after thorough evaluation of his leg to only have 1 small area that remains open at this point everything else appears to be almost completely closed. He still have significant swelling of the left lower extremity. We had discussed discussing this with his primary care provider he is not able to see her in person they were at the [facility] and right now the [place] is not seeing patients on site. According to the patient anyway. Subsequently he did speak with her apparently and his primary care provider feels that he may likely have a DVT. With that being said she has not seen his leg she is just going off of his history. Nonetheless that is a concern that the patient now has as well and while I do not feel the DVT is likely we can definitely ensure that that is not the case I will go ahead and see about putting that order in today. Nonetheless otherwise I am in a recommend that we continue with the current wound care measures including the compression therapy most likely. We just need to ensure that his leg is indeed free of any DVTs. 12/17/2018 on evaluation today patient actually appears to be completely healed today. He does have 2 very small areas of blistering although this is not anything too significant at this point which is good news. With that being said I am in agreement with the fact that I think he is completely healed at this point. He does want to get back into his compression stocking. The good news is we have gotten approval from insurance for his lymphedema pumps we received a letter since last saw him last week. The other good news is his study did come back and showed no evidence of a DVT. 12/20/2018 on evaluation today patient presents for follow-up concerning his ongoing issues with his left lower extremity. He was actually discharged last Friday and did fairly well until he states blisters opened this morning. He tells me he has  been wearing his compression stocking although he has a hard time getting this on. There does not appear to be any signs of active infection at this time. No fevers, chills, nausea, vomiting, or diarrhea. 12/27/2018 on evaluation today patient appears to be doing very well with regard to his swelling of the left lower  extremity the 4 layer compression wrap seems to have been beneficial for him. Fortunately there is no signs of active infection at this time. Patient has been tolerating the compression wrap without complication and his foot swelling in particular appears to be greatly improved. He does still have a wound on the lateral portion of his left leg I believe this is more of a blister that has now reopened. [name], [name] ([id number]) 01/03/2019 on evaluation today patient actually appears to be doing excellent in regard to his left lower extremity. He did receive his compression pumps and is actually use this 7 times since he was last here in the office. On top of the compression wrap he is now roughly 3 cm better at the calf and 2 cm better at the ankle he also states that his foot seem to go an issue better without even having to use a shoe horn. Obviously I think this is all evidence that he is doing excellent in this regard. The other good news is he does not appear to have anything open today as far as wounds are concerned. 01/15/2019 on evaluation today patient appears to be doing more poorly yet again with regard to his left lower extremity. He has developed new wounds again after being discharged just recently. Unfortunately this continues to be the case that he will heal and then have subsequent new wounds. The last time I was hopeful that he may not end up coming back too quickly especially since he states he has been using his lymphedema pumps along with wearing his compression. Nonetheless he had a blister on the back of his leg that popped up on the left and this has opened up into  an ulceration it is quite painful. 01/22/19 on evaluation today patient actually appears to be doing well with regard to his wound on the left lower extremity. He's been tolerating the dressing changes without complication including the compression wrap in the wound appears to be significantly smaller today which is great news. Overall very pleased in this regard. 01/29/2019 on evaluation today patient appears to be doing well with regard to his left posterior lower extremity ulcer. He has been tolerating the dressing changes without complication. This is not completely healed but is getting much closer. We did order a Farrow wrap 4000 for him he has received this and has it with him today although I am not sure we are quite ready to start him on that as of yet. We are very close. 02/05/2019 on evaluation today patient actually appears to be doing quite well with regard to his left posterior lower extremity ulcer. He still has a very tiny opening remaining but the fortunate thing is he seems to be healing quite nicely. He also did get his Farrow wrap which I am hoping will help with his edema control as well at home. Fortunately there is no evidence of active infection. 02/12/2019 patient and fortunately appears to be doing poorly in regard to his wounds of the left lower extremity. He was very close to healing therefore we attempted to use his Velcro compression wraps continuing with lymphedema pumps at home. Unfortunately that does not seem to have done very well for him. He tells me that he wore them all the time but again I am not sure why if that is the case that he is having such significant edema. He is still on his fluid pills as well. With that being said there is no obvious sign of  infection although I do wonder about the possibility of infection at this time as well. 02/19/2019 unfortunately upon evaluation today patient appears to be doing more poorly with regard to his left  lower extremity. He is not showing signs of significant improvement and I think the biggest issue here is that he does have an infection that appears to likely be Pseudomonas. That is based on the blue-green drainage that were noted at this time. Unfortunately the antibiotic that has been on is not going to take care of this at all. I think they will get a need to switch him to either Levaquin or Cipro and this was discussed with the patient. 02/26/2019 on evaluation today patient's lower extremity on the left appears to be doing significantly better as compared to last evaluation. Fortunately there is no signs of active infection at this time. He has been tolerating the compression wrap without complication in fact he made it the whole week at this point. He is showing signs of excellent improvement I am very happy in this regard. With that being said he is having some issues with infection we did review the results of his culture which I noted today. He did have a positive finding for Enterobacter as well as Alcaligenes faecalis. Fortunately the Levaquin that I placed him on will work for both which is great news. There is no signs of systemic infection at this point. 10/30; left posterior leg wound in the setting of very significant edema and what looks like chronic venous inflammation. He has compression pumps but does not use them. We have been using 3 layer compression. Silver alginate to the wound as the primary dressing 03/18/2019 on evaluation today patient appears to be doing a little better compared to last time I saw him. He really has not been using his compression pumps he tells me that he is having too much discomfort. He has been keeping his wraps on however. He is only been taking his fluid pills every other day because he states they are not really helping and he has an appointment with his primary care provider at the [facility] tomorrow. Subsequently the wound itself on the left lower  extremity does seem to be greatly improved compared to previous. Patient History Information obtained from Patient. Family History Cancer - Mother, Stroke - [name], [name] ([id number]) No family history of Diabetes, Heart Disease, Hereditary Spherocytosis, Hypertension, Kidney Disease, Lung Disease, Seizures, Thyroid Problems, Tuberculosis. Social History Former smoker - quit 5 years ago, Marital Status - Divorced, Alcohol Use - Never, Drug Use - No History, Caffeine Use - Daily. Medical History Cardiovascular Patient has history of Hypertension, Peripheral Venous Disease Denies history of Angina, Arrhythmia, Congestive Heart Failure, Coronary Artery Disease, Deep Vein Thrombosis, Hypotension, Myocardial Infarction, Peripheral Arterial Disease, Phlebitis, Vasculitis Endocrine Patient has history of Type II Diabetes Integumentary (Skin) Denies history of History of Burn, History of pressure wounds Neurologic Patient has history of Neuropathy Denies history of Dementia, Quadriplegia, Paraplegia, Seizure Disorder Oncologic Patient has history of Received Chemotherapy Denies history of Received Radiation Medical And Surgical History Notes 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 Respiratory Denies complaints or symptoms of Chronic or frequent coughs, Shortness of Breath. Cardiovascular Complains or has symptoms of LE edema. Denies complaints or symptoms of Chest pain. Psychiatric Denies complaints or symptoms of Anxiety, Claustrophobia. Objective Constitutional Well-nourished and well-hydrated in no acute  distress. Vitals Time Taken: 8:57 AM, Height: 73 in, Weight: 300 lbs, BMI: 39.6, Temperature: 98.9 F, Pulse: 57 bpm, Respiratory Rate: 18 breaths/min, Blood Pressure: 130/73 mmHg. Respiratory normal breathing without  difficulty. clear to auscultation bilaterally. Cardiovascular [name], [name] ([id number]) regular rate and rhythm with normal S1, S2. Patient does have bilateral stage III lymphedema.Marland Kitchen Psychiatric this patient is able to make decisions and demonstrates good insight into disease process. Alert and Oriented x 3. pleasant and cooperative. General Notes: Patient's wound bed currently showed signs of good granulation at this time. Fortunately there is improvement in the overall size of the wound although unfortunately the patient still is having a lot of issues with edema control he has significant edema which we have really not been able to do a sufficient amount of improvement concerning unfortunately. He seems to be very frustrated with the situation which I completely understand. Integumentary (Hair, Skin) Wound #8 status is Open. Original cause of wound was Blister. The wound is located on the Left,Posterior Lower Leg. The wound measures 5cm length x 5cm width x 0.1cm depth; 19.635cm^2 area and 1.963cm^3 volume. There is Fat Layer (Subcutaneous Tissue) Exposed exposed. There is no tunneling or undermining noted. There is a medium amount of serous drainage noted. The wound margin is flat and intact. There is large (67-100%) red granulation within the wound bed. There is no necrotic tissue within the wound bed. General Notes: maceration Assessment Active Problems ICD-10 Type 2 diabetes mellitus with other skin ulcer Lymphedema, not elsewhere classified Venous insufficiency (chronic) (peripheral) Non-pressure chronic ulcer of other part of left lower leg with fat layer exposed Essential (primary) hypertension Plan Wound Cleansing: Wound #8 Left,Posterior Lower Leg: May shower with protection. - Please do not get your wrap wet Primary Wound Dressing: Wound #8 Left,Posterior Lower Leg: Silver Alginate Other: - adaptic contact layer Secondary Dressing: Wound #8 Left,Posterior Lower  Leg: ABD pad XtraSorb Dressing Change Frequency: Wound #8 Left,Posterior Lower Leg: Change dressing every week Follow-up Appointments: Wound #8 Left,Posterior Lower Leg: Return Appointment in 1 week. [place], [name] ([id number]) Edema Control: Wound #8 Left,Posterior Lower Leg: 4 Layer Compression System - Bilateral - unna to anchor Consults ordered were: Vascular - Consult 1. At this point I do believe that the current wound care measures including the contact layer followed by alginate dressing and Lauraine Rinne is appropriate he seems to be doing well with this. We will actually do this bilaterally as the patient is also having issues with weeping from the right lower extremity. 2. I am also going to suggest at this point that we continue with the 4 layer compression wrap but we will add this bilaterally in order to help keep edema under good control. 3. I would also suggest that at this point we may want to get him back to see vascular he tells me has been about 2 years or so since he seen Dr. Delana Meyer I believe that he may need to have a reevaluation and see if there is anything they can do to help in this situation the patient is having a significant amount of edema and we are having a difficult time getting it under control. Whenever we are able to get his edema under better control he then subsequently ends up with reopening as soon as we try to go into Velcro wraps. Overall this has been very frustrating for the patient which I completely understand. Nonetheless I am not sure that there is a vascular solution from a procedure standpoint but again  I would like for him to see Dr. Delana Meyer and see what they recommend. We will see patient back for reevaluation in 1 week here in the clinic. If anything worsens or changes patient will contact our office for additional recommendations. Electronic Signature(s) Signed: 03/18/2019 9:42:08 AM By: [name] Keeler PA-C Entered By: [name] on  03/18/2019 09:42:07 [name] ([id number]) -------------------------------------------------------------------------------- ROS/PFSH Details Patient Name: [name] Date of Service: 03/18/2019 9:00 AM Medical Record Number: [id number] Patient Account Number: [id number] Date of Birth/Sex: [birth year] (68 y.o. M) Treating RN: [name] Primary Care Provider: SYSTEM, Mooney Other Clinician: Referring Provider: Referral, Self Treating Provider/Extender: [name] III, Hinton Luellen Mooney in Treatment: 18 Information Obtained From Patient Constitutional Symptoms (General Health) Complaints and Symptoms: Negative for: Fatigue; Fever; Chills; Marked Weight Change Respiratory Complaints and Symptoms: Negative for: Chronic or frequent coughs; Shortness of Breath Cardiovascular Complaints and Symptoms: Positive for: LE edema Negative for: Chest pain Medical History: Positive for: Hypertension; Peripheral Venous Disease Negative for: Angina; Arrhythmia; Congestive Heart Failure; Coronary Artery Disease; Deep Vein Thrombosis; Hypotension; Myocardial Infarction; Peripheral Arterial Disease; Phlebitis; Vasculitis Psychiatric Complaints and Symptoms: Negative for: Anxiety; Claustrophobia Endocrine Medical History: Positive for: Type II Diabetes Treated with: Oral agents Integumentary (Skin) Medical History: Negative for: History of Burn; History of pressure wounds Neurologic Medical History: Positive for: Neuropathy Negative for: Dementia; Quadriplegia; Paraplegia; Seizure Disorder Oncologic Medical History: Positive for: Received Chemotherapy [name], [name] ([id number]) Negative for: Received Radiation Past Medical History Notes: 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 Immunizations Pneumococcal Vaccine: Received Pneumococcal Vaccination: Yes Immunization Notes: up to date Implantable Devices None Family and Social History Cancer: Yes - Mother; Diabetes: No; Heart Disease: No; Hereditary  Spherocytosis: No; Hypertension: No; Kidney Disease: No; Lung Disease: No; Seizures: No; Stroke: Yes - Father; Thyroid Problems: No; Tuberculosis: No; Former smoker - quit 5 years ago; Marital Status - Divorced; Alcohol Use: Never; Drug Use: No History; Caffeine Use: Daily; Financial Concerns: No; Food, Clothing or Shelter Needs: No; Support System Lacking: No; Transportation Concerns: No Physician Affirmation I have reviewed and agree with the above information. Electronic Signature(s) Signed: 03/18/2019 11:27:10 AM By: [name] Keeler PA-C Signed: 03/18/2019 4:23:10 PM By: [name] Entered By: [name] on 03/18/2019 09:40:06 [name] ([id number]) -------------------------------------------------------------------------------- SuperBill Details Patient Name: [name] Date of Service: 03/18/2019 Medical Record Number: [id number] Patient Account Number: [id number] Date of Birth/Sex: 1951-05-06 (68 y.o. M) Treating RN: [name] Primary Care Provider: SYSTEM, Mooney Other Clinician: Referring Provider: Referral, Self Treating Provider/Extender: [name] III, Jania Steinke Mooney in Treatment: 18 Diagnosis Coding ICD-10 Codes Code Description E11.622 Type 2 diabetes mellitus with other skin ulcer I89.0 Lymphedema, not elsewhere classified I87.2 Venous insufficiency (chronic) (peripheral) L97.822 Non-pressure chronic ulcer of other part of left lower leg with fat layer exposed I10 Essential (primary) hypertension Facility Procedures CPT4: Description Modifier Quantity Code [id number] - WOUND CARE VISIT-LEV 2 EST PT 1 CPT4: [id number] BILATERAL: Application of multi-layer venous compression system; leg (below 1 knee), including ankle and foot. Physician Procedures CPT4 Code Description: [id number] - WC PHYS LEVEL 4 - EST PT ICD-10 Diagnosis Description E11.622 Type 2 diabetes mellitus with other skin ulcer I89.0 Lymphedema, not elsewhere classified I87.2 Venous  insufficiency (chronic) (peripheral) L97.822  Non-pressure chronic ulcer of other part of left lower leg wit Modifier: h fat layer expos Quantity: 1 ed Electronic Signature(s) Signed: 03/18/2019 9:42:36 AM By: [name] Keeler PA-C Entered By: [name] on 03/18/2019 09:42:36

## 2019-03-25 ENCOUNTER — Other Ambulatory Visit: Payer: Self-pay

## 2019-03-25 ENCOUNTER — Encounter: Payer: PPO | Admitting: Physician Assistant

## 2019-03-25 DIAGNOSIS — I890 Lymphedema, not elsewhere classified: Secondary | ICD-10-CM | POA: Diagnosis not present

## 2019-03-25 DIAGNOSIS — L97222 Non-pressure chronic ulcer of left calf with fat layer exposed: Secondary | ICD-10-CM | POA: Diagnosis not present

## 2019-03-25 DIAGNOSIS — E11622 Type 2 diabetes mellitus with other skin ulcer: Secondary | ICD-10-CM | POA: Diagnosis not present

## 2019-03-25 NOTE — Progress Notes (Addendum)
OTHA, KELLNER ([id number]) Visit Report for 03/25/2019 Chief Complaint Document Details Patient Name: [name], [name] Date of Service: 03/25/2019 8:45 AM Medical Record Number: [id number] Patient Account Number: [id number] Date of Birth/Sex: 01-Jun-1950 (68 y.o. M) Treating RN: Harold Barban Primary Care Provider: SYSTEM, PCP Other Clinician: Referring Provider: Referral, Self Treating Provider/Extender: Melburn Hake, [name] Weeks in Treatment: 19 Information Obtained from: Patient Chief Complaint Left LE ulcers Electronic Signature(s) Signed: 03/25/2019 8:47:58 AM By: Worthy Keeler PA-C Entered By: Worthy Keeler on 03/25/2019 08:47:58 [name] ([id number]) -------------------------------------------------------------------------------- HPI Details Patient Name: [name] Date of Service: 03/25/2019 8:45 AM Medical Record Number: [id number] Patient Account Number: [id number] Date of Birth/Sex: [birth year] (68 y.o. M) Treating RN: Harold Barban Primary Care Provider: SYSTEM, PCP Other Clinician: Referring Provider: Referral, Self Treating Provider/Extender: STONE III, [name] Weeks in Treatment: 19 History of Present Illness HPI Description: 68 year old male who presented to the ER with bilateral lower extremity blisters which had started last week. he has a past medical history of leukemia, diabetes mellitus, hypertension, edema of both lower extremities, his recurrent skin infections, peripheral vascular disease, coronary artery disease, congestive heart failure and peripheral neuropathy. in the ER he was given Rocephin and put on Silvadene cream. he was put on oral doxycycline and was asked to follow-up with the [facility]. His last hemoglobin A1c was 6.6 in December and he checks his blood sugar once a week. He does not have any physicians outside the [place] system. He does not recall any vascular duplex studies done either for arterial or venous disease but was told to wear  compression stockings which he does not use 05/30/2016 -- we have not yet received any of his notes from the [facility] hospital system and his arterial and venous duplex studies are scheduled here in [place] around mid February. We are unable to have his insurance accepted by home health agencies and hence he is getting dressings only once a week. 06/06/16 -- -- I received a call from the patient's PCP at the [facility] and spoke to Dr. Garvin Fila, phone number [phone] and fax number [phone]. She confirmed that no vascular testing was done over the last 5 years and she would be happy to do them if the patient did want them to be done at the [place] and we could fax him a request. Readmission: 68 year old male seen by as in February of this year and was referred to vein and vascular for studies and opinion from the vascular surgeons. The patient returns today with a fresh problem having had blisters on his left lower extremity which have been there for about 5 days and he clearly states that he has been wearing his compression stockings as advised though he could not read the moderate compression and has been wearing light compression. Review of his electronic medical records note that he had lower extremity arterial duplex examination done on 06/23/2016 which showed no hemodynamically significant stenosis in the bilateral lower extremity arterial system. He also had a lower extremity venous reflux examination done on 07/07/2016 and it was noted that he had venous incompetence in the right great saphenous vein and bilateral common femoral veins. Patient was seen by Dr. Tamala Julian on the same day and for some reason his notes do not reflect the venous studies or the arterial studies and he recommended patient do a venous duplex ultrasound to look for reflux and return to see him.he would also consider a lymph pump if required. The patient was told that  his workup was normal and hence the  patient canceled his follow-up appointment. 02/03/17 on evaluation today patient left medial lower extremity blister appears to be doing about the same. It is still continuing to drain and there's still the blistered skin covering the wound bed which is making it difficult for the alternate to do its job. Fortunately there is no evidence of cellulitis. No fevers chills noted. Patient states in general he is not having any significant discomfort. Patient's lower extremity arterial duplex exam revealed that patient was hemodynamically stable with no evidence of stenosis in regard to the bilateral lower extremities. The lower extremity venous reflux exam revealed the patient had venous incontinence noted in the right greater saphenous and bilateral common femoral vein. There is no evidence of deep or superficial vein thrombosis in the bilateral lower extremities. Readmission: [name], [name] ([id number]) 11/12/18 Patient presents for evaluation our clinic today concerning issues that he is having with his left lower extremity. He tells me that a couple weeks ago he began developing blisters on the left lower extremity along with increased swelling. He typically wears his compression stockings on a regular basis is previously been evaluated both here as well is with vascular surgery they would recommend lymphedema pumps but unfortunately that somehow fell through and he never heard anything back from that. Nonetheless I think lymphedema pumps would be beneficial for this patient. He does have a history of hypertension and diabetes. Obviously the chronic venous stasis and lymphedema as well. At this point the blisters have been given in more trouble he states sometimes when the blisters openings able to clean it down with alcohol and it will dry out and do well. Unfortunately that has not been the case this time. He is having some discomfort although this mean these with cleaning the areas he doesn't have  discomfort just on a regular basis. He has not been able to wear his compression stockings since the blisters arose due to the fact that of course it will drain into the socks causing additional issues and he didn't have any way to wrap this otherwise. He has increased to taking his Lasix every day instead of every other day. He sees his primary care provider later this month as well. No fevers, chills, nausea, or vomiting noted at this time. 11/19/18-Patient returns at 1 week, per intake RN the amount of seepage into the compression wraps was definitely improved, overall all the wounds are measuring smaller but continuing silver alginate to the wounds as primary dressing 11/26/18 on evaluation today patient appears to be doing quite well in regard to his left lower [place] ulcers. In fact of the areas that were noted initially he only has two regions still open. There is no evidence of active infection at this time. He still is not heard anything from the company regarding lymphedema pumps as of yet. Again as previously seen vascular they have not recommended any surgical intervention. 12/03/2018 on evaluation today patient actually appears to be doing quite well with regard to his lower extremity ulcers. In fact most of the areas appear to be healed the one spot which does not seem to be completely healed I am unsure of whether or not this is really draining that much but nonetheless there does not appear to be any signs of infection or significant drainage at this point. There is no sign of fever, chills, nausea, vomiting, or diarrhea. Overall I am pleased with how things have progressed I think is very close  to being able to transition to his home compression stockings. 12/10/2018 upon evaluation today patient appears to be doing quite well with regard to his left lower extremity. He has been tolerating the dressing changes without complication. Fortunately there is no signs of active infection at  this time. He appears after thorough evaluation of his leg to only have 1 small area that remains open at this point everything else appears to be almost completely closed. He still have significant swelling of the left lower extremity. We had discussed discussing this with his primary care provider he is not able to see her in person they were at the [facility] and right now the [place] is not seeing patients on site. According to the patient anyway. Subsequently he did speak with her apparently and his primary care provider feels that he may likely have a DVT. With that being said she has not seen his leg she is just going off of his history. Nonetheless that is a concern that the patient now has as well and while I do not feel the DVT is likely we can definitely ensure that that is not the case I will go ahead and see about putting that order in today. Nonetheless otherwise I am in a recommend that we continue with the current wound care measures including the compression therapy most likely. We just need to ensure that his leg is indeed free of any DVTs. 12/17/2018 on evaluation today patient actually appears to be completely healed today. He does have 2 very small areas of blistering although this is not anything too significant at this point which is good news. With that being said I am in agreement with the fact that I think he is completely healed at this point. He does want to get back into his compression stocking. The good news is we have gotten approval from insurance for his lymphedema pumps we received a letter since last saw him last week. The other good news is his study did come back and showed no evidence of a DVT. 12/20/2018 on evaluation today patient presents for follow-up concerning his ongoing issues with his left lower extremity. He was actually discharged last Friday and did fairly well until he states blisters opened this morning. He tells me he has been wearing his compression  stocking although he has a hard time getting this on. There does not appear to be any signs of active infection at this time. No fevers, chills, nausea, vomiting, or diarrhea. 12/27/2018 on evaluation today patient appears to be doing very well with regard to his swelling of the left lower extremity the 4 layer compression wrap seems to have been beneficial for him. Fortunately there is no signs of active infection at this time. Patient has been tolerating the compression wrap without complication and his foot swelling in particular appears to be greatly improved. He does still have a wound on the lateral portion of his left leg I believe this is more of a blister that has now reopened. 01/03/2019 on evaluation today patient actually appears to be doing excellent in regard to his left lower extremity. He did receive his compression pumps and is actually use this 7 times since he was last here in the office. On top of the compression wrap he is now roughly 3 cm better at the calf and 2 cm better at the ankle he also states that his foot seem to go an issue better without even having to use a shoe horn.  Obviously I think this is all evidence that he is doing excellent in this regard. The other good news is he does not appear to have anything open today as far as wounds are concerned. [name], [name] ([id number]) 01/15/2019 on evaluation today patient appears to be doing more poorly yet again with regard to his left lower extremity. He has developed new wounds again after being discharged just recently. Unfortunately this continues to be the case that he will heal and then have subsequent new wounds. The last time I was hopeful that he may not end up coming back too quickly especially since he states he has been using his lymphedema pumps along with wearing his compression. Nonetheless he had a blister on the back of his leg that popped up on the left and this has opened up into an ulceration it is quite  painful. 01/22/19 on evaluation today patient actually appears to be doing well with regard to his wound on the left lower extremity. He's been tolerating the dressing changes without complication including the compression wrap in the wound appears to be significantly smaller today which is great news. Overall very pleased in this regard. 01/29/2019 on evaluation today patient appears to be doing well with regard to his left posterior lower extremity ulcer. He has been tolerating the dressing changes without complication. This is not completely healed but is getting much closer. We did order a Farrow wrap 4000 for him he has received this and has it with him today although I am not sure we are quite ready to start him on that as of yet. We are very close. 02/05/2019 on evaluation today patient actually appears to be doing quite well with regard to his left posterior lower extremity ulcer. He still has a very tiny opening remaining but the fortunate thing is he seems to be healing quite nicely. He also did get his Farrow wrap which I am hoping will help with his edema control as well at home. Fortunately there is no evidence of active infection. 02/12/2019 patient and fortunately appears to be doing poorly in regard to his wounds of the left lower extremity. He was very close to healing therefore we attempted to use his Velcro compression wraps continuing with lymphedema pumps at home. Unfortunately that does not seem to have done very well for him. He tells me that he wore them all the time but again I am not sure why if that is the case that he is having such significant edema. He is still on his fluid pills as well. With that being said there is no obvious sign of infection although I do wonder about the possibility of infection at this time as well. 02/19/2019 unfortunately upon evaluation today patient appears to be doing more poorly with regard to his left lower extremity. He is not showing signs  of significant improvement and I think the biggest issue here is that he does have an infection that appears to likely be Pseudomonas. That is based on the blue-green drainage that were noted at this time. Unfortunately the antibiotic that has been on is not going to take care of this at all. I think they will get a need to switch him to either Levaquin or Cipro and this was discussed with the patient. 02/26/2019 on evaluation today patient's lower extremity on the left appears to be doing significantly better as compared to last evaluation. Fortunately there is no signs of active infection at this time. He has been tolerating the  compression wrap without complication in fact he made it the whole week at this point. He is showing signs of excellent improvement I am very happy in this regard. With that being said he is having some issues with infection we did review the results of his culture which I noted today. He did have a positive finding for Enterobacter as well as Alcaligenes faecalis. Fortunately the Levaquin that I placed him on will work for both which is great news. There is no signs of systemic infection at this point. 10/30; left posterior leg wound in the setting of very significant edema and what looks like chronic venous inflammation. He has compression pumps but does not use them. We have been using 3 layer compression. Silver alginate to the wound as the primary dressing 03/18/2019 on evaluation today patient appears to be doing a little better compared to last time I saw him. He really has not been using his compression pumps he tells me that he is having too much discomfort. He has been keeping his wraps on however. He is only been taking his fluid pills every other day because he states they are not really helping and he has an appointment with his primary care provider at the [facility] tomorrow. Subsequently the wound itself on the left lower extremity does seem to be greatly improved  compared to previous. 03/25/2019 on evaluation today patient appears to be doing better with regard to his wounds on the bilateral lower extremities. The left is doing excellent the right is also doing better although both still do show some signs of open wounds noted at this point unfortunately. Fortunately there is no signs of active infection at this time. The patient also is not really having any significant pain which is good news. Unfortunately there was some confusion with the referral on vascular disease and as far as getting the patient scheduled there can be contacting him later today to do this fortunately we got this straightened out. Electronic Signature(s) Signed: 03/25/2019 1:00:07 PM By: Jamie Kato, [place] ([id number]) Entered By: Worthy Keeler on 03/25/2019 13:00:07 [name] ([id number]) -------------------------------------------------------------------------------- Physical Exam Details Patient Name: [name], [name] Date of Service: 03/25/2019 8:45 AM Medical Record Number: [id number] Patient Account Number: [id number] Date of Birth/Sex: 03-31-1951 (68 y.o. M) Treating RN: Harold Barban Primary Care Provider: SYSTEM, PCP Other Clinician: Referring Provider: Referral, Self Treating Provider/Extender: STONE III, [name] Weeks in Treatment: 19 Constitutional Obese and well-hydrated in no acute distress. Respiratory normal breathing without difficulty. clear to auscultation bilaterally. Cardiovascular regular rate and rhythm with normal S1, S2. Psychiatric this patient is able to make decisions and demonstrates good insight into disease process. Alert and Oriented x 3. pleasant and cooperative. Notes Upon inspection today patient's wound bed actually showed signs of good epithelialization and granulation at this time on both lower extremities. He seems to be making good progress which is excellent news. With that being said he still has a lot of edema  and again more having him see vascular for consult in order to evaluate and see whether or not they can help with his edema 1 way or another. Potentially with some type of surgical intervention but again I am not even sure if that is an option for him which she will have to see. Electronic Signature(s) Signed: 03/25/2019 1:25:58 PM By: Worthy Keeler PA-C Entered By: Worthy Keeler on 03/25/2019 13:25:57 [name] ([id number]) -------------------------------------------------------------------------------- Physician Orders Details Patient Name: [name] Date of Service: 03/25/2019  8:45 AM Medical Record Number: [id number] Patient Account Number: [id number] Date of Birth/Sex: April 02, 1951 (68 y.o. M) Treating RN: Harold Barban Primary Care Provider: SYSTEM, PCP Other Clinician: Referring Provider: Referral, Self Treating Provider/Extender: STONE III, [name] Weeks in Treatment: 19 Verbal / Phone Orders: No Diagnosis Coding ICD-10 Coding Code Description E11.622 Type 2 diabetes mellitus with other skin ulcer I89.0 Lymphedema, not elsewhere classified I87.2 Venous insufficiency (chronic) (peripheral) L97.822 Non-pressure chronic ulcer of other part of left lower leg with fat layer exposed I10 Essential (primary) hypertension Wound Cleansing Wound #8 Left,Posterior Lower Leg o May shower with protection. - Please do not get your wrap wet Primary Wound Dressing Wound #8 Left,Posterior Lower Leg o Silver Alginate o Other: - adaptic contact layer Secondary Dressing Wound #8 Left,Posterior Lower Leg o ABD pad o XtraSorb Dressing Change Frequency Wound #8 Left,Posterior Lower Leg o Change dressing every week Follow-up Appointments Wound #8 Left,Posterior Lower Leg o Return Appointment in 1 week. Edema Control Wound #8 Left,Posterior Lower Leg o 4 Layer Compression System - Bilateral - unna to anchor Electronic Signature(s) Signed: 03/25/2019 4:27:39 PM By:  Harold Barban Signed: 03/25/2019 4:45:50 PM By: Worthy Keeler PA-C Entered By: Harold Barban on 03/25/2019 09:15:55 [name] ([id number]) [name], [name] ([id number]) -------------------------------------------------------------------------------- Problem List Details Patient Name: [name] Date of Service: 03/25/2019 8:45 AM Medical Record Number: [id number] Patient Account Number: [id number] Date of Birth/Sex: 05/21/1950 (68 y.o. M) Treating RN: Harold Barban Primary Care Provider: SYSTEM, PCP Other Clinician: Referring Provider: Referral, Self Treating Provider/Extender: Melburn Hake, [name] Weeks in Treatment: 19 Active Problems ICD-10 Evaluated Encounter Code Description Active Date Today Diagnosis E11.622 Type 2 diabetes mellitus with other skin ulcer 11/12/2018 No Yes I89.0 Lymphedema, not elsewhere classified 11/12/2018 No Yes I87.2 Venous insufficiency (chronic) (peripheral) 11/12/2018 No Yes L97.822 Non-pressure chronic ulcer of other part of left lower leg with 11/12/2018 No Yes fat layer exposed I10 Essential (primary) hypertension 11/12/2018 No Yes Inactive Problems Resolved Problems Electronic Signature(s) Signed: 03/25/2019 8:47:52 AM By: Worthy Keeler PA-C Entered By: Worthy Keeler on 03/25/2019 08:47:52 [name] ([id number]) -------------------------------------------------------------------------------- Progress Note Details Patient Name: [name] Date of Service: 03/25/2019 8:45 AM Medical Record Number: [id number] Patient Account Number: [id number] Date of Birth/Sex: 08/14/1950 (68 y.o. M) Treating RN: Harold Barban Primary Care Provider: SYSTEM, PCP Other Clinician: Referring Provider: Referral, Self Treating Provider/Extender: STONE III, [name] Weeks in Treatment: 19 Subjective Chief Complaint Information obtained from Patient Left LE ulcers History of Present Illness (HPI) 68 year old male who presented to the ER with bilateral lower  extremity blisters which had started last week. he has a past medical history of leukemia, diabetes mellitus, hypertension, edema of both lower extremities, his recurrent skin infections, peripheral vascular disease, coronary artery disease, congestive heart failure and peripheral neuropathy. in the ER he was given Rocephin and put on Silvadene cream. he was put on oral doxycycline and was asked to follow-up with the [facility]. His last hemoglobin A1c was 6.6 in December and he checks his blood sugar once a week. He does not have any physicians outside the [place] system. He does not recall any vascular duplex studies done either for arterial or venous disease but was told to wear compression stockings which he does not use 05/30/2016 -- we have not yet received any of his notes from the [facility] hospital system and his arterial and venous duplex studies are scheduled here in [place] around mid February. We are unable to have his insurance accepted by home health agencies  and hence he is getting dressings only once a week. 06/06/16 -- -- I received a call from the patient's PCP at the [facility] and spoke to Dr. Garvin Fila, phone number [phone] and fax number [phone]. She confirmed that no vascular testing was done over the last 5 years and she would be happy to do them if the patient did want them to be done at the [place] and we could fax him a request. Readmission: 68 year old male seen by as in February of this year and was referred to vein and vascular for studies and opinion from the vascular surgeons. The patient returns today with a fresh problem having had blisters on his left lower extremity which have been there for about 5 days and he clearly states that he has been wearing his compression stockings as advised though he could not read the moderate compression and has been wearing light compression. Review of his electronic medical records note that he had lower extremity arterial  duplex examination done on 06/23/2016 which showed no hemodynamically significant stenosis in the bilateral lower extremity arterial system. He also had a lower extremity venous reflux examination done on 07/07/2016 and it was noted that he had venous incompetence in the right great saphenous vein and bilateral common femoral veins. Patient was seen by Dr. Tamala Julian on the same day and for some reason his notes do not reflect the venous studies or the arterial studies and he recommended patient do a venous duplex ultrasound to look for reflux and return to see him.he would also consider a lymph pump if required. The patient was told that his workup was normal and hence the patient canceled his follow-up appointment. 02/03/17 on evaluation today patient left medial lower extremity blister appears to be doing about the same. It is still continuing to drain and there's still the blistered skin covering the wound bed which is making it difficult for the alternate to do its job. Fortunately there is no evidence of cellulitis. No fevers chills noted. Patient states in general he is not having any significant discomfort. Patient's lower extremity arterial duplex exam revealed that patient was hemodynamically stable with no evidence of stenosis in regard to the bilateral lower extremities. The lower extremity venous reflux exam revealed the patient had venous incontinence noted in the right greater saphenous [place], [name] ([id number]) and bilateral common femoral vein. There is no evidence of deep or superficial vein thrombosis in the bilateral lower extremities. Readmission: 11/12/18 Patient presents for evaluation our clinic today concerning issues that he is having with his left lower extremity. He tells me that a couple weeks ago he began developing blisters on the left lower extremity along with increased swelling. He typically wears his compression stockings on a regular basis is previously been evaluated  both here as well is with vascular surgery they would recommend lymphedema pumps but unfortunately that somehow fell through and he never heard anything back from that. Nonetheless I think lymphedema pumps would be beneficial for this patient. He does have a history of hypertension and diabetes. Obviously the chronic venous stasis and lymphedema as well. At this point the blisters have been given in more trouble he states sometimes when the blisters openings able to clean it down with alcohol and it will dry out and do well. Unfortunately that has not been the case this time. He is having some discomfort although this mean these with cleaning the areas he doesn't have discomfort just on a regular basis. He has  not been able to wear his compression stockings since the blisters arose due to the fact that of course it will drain into the socks causing additional issues and he didn't have any way to wrap this otherwise. He has increased to taking his Lasix every day instead of every other day. He sees his primary care provider later this month as well. No fevers, chills, nausea, or vomiting noted at this time. 11/19/18-Patient returns at 1 week, per intake RN the amount of seepage into the compression wraps was definitely improved, overall all the wounds are measuring smaller but continuing silver alginate to the wounds as primary dressing 11/26/18 on evaluation today patient appears to be doing quite well in regard to his left lower [place] ulcers. In fact of the areas that were noted initially he only has two regions still open. There is no evidence of active infection at this time. He still is not heard anything from the company regarding lymphedema pumps as of yet. Again as previously seen vascular they have not recommended any surgical intervention. 12/03/2018 on evaluation today patient actually appears to be doing quite well with regard to his lower extremity ulcers. In fact most of the areas appear  to be healed the one spot which does not seem to be completely healed I am unsure of whether or not this is really draining that much but nonetheless there does not appear to be any signs of infection or significant drainage at this point. There is no sign of fever, chills, nausea, vomiting, or diarrhea. Overall I am pleased with how things have progressed I think is very close to being able to transition to his home compression stockings. 12/10/2018 upon evaluation today patient appears to be doing quite well with regard to his left lower extremity. He has been tolerating the dressing changes without complication. Fortunately there is no signs of active infection at this time. He appears after thorough evaluation of his leg to only have 1 small area that remains open at this point everything else appears to be almost completely closed. He still have significant swelling of the left lower extremity. We had discussed discussing this with his primary care provider he is not able to see her in person they were at the [facility] and right now the [place] is not seeing patients on site. According to the patient anyway. Subsequently he did speak with her apparently and his primary care provider feels that he may likely have a DVT. With that being said she has not seen his leg she is just going off of his history. Nonetheless that is a concern that the patient now has as well and while I do not feel the DVT is likely we can definitely ensure that that is not the case I will go ahead and see about putting that order in today. Nonetheless otherwise I am in a recommend that we continue with the current wound care measures including the compression therapy most likely. We just need to ensure that his leg is indeed free of any DVTs. 12/17/2018 on evaluation today patient actually appears to be completely healed today. He does have 2 very small areas of blistering although this is not anything too significant at this  point which is good news. With that being said I am in agreement with the fact that I think he is completely healed at this point. He does want to get back into his compression stocking. The good news is we have gotten approval from insurance  for his lymphedema pumps we received a letter since last saw him last week. The other good news is his study did come back and showed no evidence of a DVT. 12/20/2018 on evaluation today patient presents for follow-up concerning his ongoing issues with his left lower extremity. He was actually discharged last Friday and did fairly well until he states blisters opened this morning. He tells me he has been wearing his compression stocking although he has a hard time getting this on. There does not appear to be any signs of active infection at this time. No fevers, chills, nausea, vomiting, or diarrhea. 12/27/2018 on evaluation today patient appears to be doing very well with regard to his swelling of the left lower extremity the 4 layer compression wrap seems to have been beneficial for him. Fortunately there is no signs of active infection at this time. Patient has been tolerating the compression wrap without complication and his foot swelling in particular appears to be greatly improved. He does still have a wound on the lateral portion of his left leg I believe this is more of a blister that has now reopened. [name], [name] ([id number]) 01/03/2019 on evaluation today patient actually appears to be doing excellent in regard to his left lower extremity. He did receive his compression pumps and is actually use this 7 times since he was last here in the office. On top of the compression wrap he is now roughly 3 cm better at the calf and 2 cm better at the ankle he also states that his foot seem to go an issue better without even having to use a shoe horn. Obviously I think this is all evidence that he is doing excellent in this regard. The other good news is he does  not appear to have anything open today as far as wounds are concerned. 01/15/2019 on evaluation today patient appears to be doing more poorly yet again with regard to his left lower extremity. He has developed new wounds again after being discharged just recently. Unfortunately this continues to be the case that he will heal and then have subsequent new wounds. The last time I was hopeful that he may not end up coming back too quickly especially since he states he has been using his lymphedema pumps along with wearing his compression. Nonetheless he had a blister on the back of his leg that popped up on the left and this has opened up into an ulceration it is quite painful. 01/22/19 on evaluation today patient actually appears to be doing well with regard to his wound on the left lower extremity. He's been tolerating the dressing changes without complication including the compression wrap in the wound appears to be significantly smaller today which is great news. Overall very pleased in this regard. 01/29/2019 on evaluation today patient appears to be doing well with regard to his left posterior lower extremity ulcer. He has been tolerating the dressing changes without complication. This is not completely healed but is getting much closer. We did order a Farrow wrap 4000 for him he has received this and has it with him today although I am not sure we are quite ready to start him on that as of yet. We are very close. 02/05/2019 on evaluation today patient actually appears to be doing quite well with regard to his left posterior lower extremity ulcer. He still has a very tiny opening remaining but the fortunate thing is he seems to be healing quite nicely. He also did get  his Farrow wrap which I am hoping will help with his edema control as well at home. Fortunately there is no evidence of active infection. 02/12/2019 patient and fortunately appears to be doing poorly in regard to his wounds of the left  lower extremity. He was very close to healing therefore we attempted to use his Velcro compression wraps continuing with lymphedema pumps at home. Unfortunately that does not seem to have done very well for him. He tells me that he wore them all the time but again I am not sure why if that is the case that he is having such significant edema. He is still on his fluid pills as well. With that being said there is no obvious sign of infection although I do wonder about the possibility of infection at this time as well. 02/19/2019 unfortunately upon evaluation today patient appears to be doing more poorly with regard to his left lower extremity. He is not showing signs of significant improvement and I think the biggest issue here is that he does have an infection that appears to likely be Pseudomonas. That is based on the blue-green drainage that were noted at this time. Unfortunately the antibiotic that has been on is not going to take care of this at all. I think they will get a need to switch him to either Levaquin or Cipro and this was discussed with the patient. 02/26/2019 on evaluation today patient's lower extremity on the left appears to be doing significantly better as compared to last evaluation. Fortunately there is no signs of active infection at this time. He has been tolerating the compression wrap without complication in fact he made it the whole week at this point. He is showing signs of excellent improvement I am very happy in this regard. With that being said he is having some issues with infection we did review the results of his culture which I noted today. He did have a positive finding for Enterobacter as well as Alcaligenes faecalis. Fortunately the Levaquin that I placed him on will work for both which is great news. There is no signs of systemic infection at this point. 10/30; left posterior leg wound in the setting of very significant edema and what looks like chronic venous  inflammation. He has compression pumps but does not use them. We have been using 3 layer compression. Silver alginate to the wound as the primary dressing 03/18/2019 on evaluation today patient appears to be doing a little better compared to last time I saw him. He really has not been using his compression pumps he tells me that he is having too much discomfort. He has been keeping his wraps on however. He is only been taking his fluid pills every other day because he states they are not really helping and he has an appointment with his primary care provider at the [facility] tomorrow. Subsequently the wound itself on the left lower extremity does seem to be greatly improved compared to previous. 03/25/2019 on evaluation today patient appears to be doing better with regard to his wounds on the bilateral lower extremities. The left is doing excellent the right is also doing better although both still do show some signs of open wounds noted at this point unfortunately. Fortunately there is no signs of active infection at this time. The patient also is not really having any significant pain which is good news. Unfortunately there was some confusion with the referral on vascular disease and as far as getting the patient scheduled  there can be contacting him later today to do this fortunately we got this straightened out. [name], [name] ([id number]) Patient History Information obtained from Patient. Family History Cancer - Mother, Stroke - Father, No family history of Diabetes, Heart Disease, Hereditary Spherocytosis, Hypertension, Kidney Disease, Lung Disease, Seizures, Thyroid Problems, Tuberculosis. Social History Former smoker - quit 5 years ago, Marital Status - Divorced, Alcohol Use - Never, Drug Use - No History, Caffeine Use - Daily. Medical History Cardiovascular Patient has history of Hypertension, Peripheral Venous Disease Denies history of Angina, Arrhythmia, Congestive Heart Failure, Coronary  Artery Disease, Deep Vein Thrombosis, Hypotension, Myocardial Infarction, Peripheral Arterial Disease, Phlebitis, Vasculitis Endocrine Patient has history of Type II Diabetes Integumentary (Skin) Denies history of History of Burn, History of pressure wounds Neurologic Patient has history of Neuropathy Denies history of Dementia, Quadriplegia, Paraplegia, Seizure Disorder Oncologic Patient has history of Received Chemotherapy Denies history of Received Radiation Medical And Surgical History Notes 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 Respiratory Denies complaints or symptoms of Chronic or frequent coughs, Shortness of Breath. Cardiovascular Complains or has symptoms of LE edema. Denies complaints or symptoms of Chest pain. Psychiatric Denies complaints or symptoms of Anxiety, Claustrophobia. Objective Constitutional Obese and well-hydrated in no acute distress. Vitals Time Taken: 8:49 AM, Height: 73 in, Weight: 300 lbs, BMI: 39.6, Temperature: 98.4 F, Pulse: 57 bpm, Respiratory [name], [name] ([id number]) Rate: 18 breaths/min, Blood Pressure: 118/54 mmHg. Respiratory normal breathing without difficulty. clear to auscultation bilaterally. Cardiovascular regular rate and rhythm with normal S1, S2. Psychiatric this patient is able to make decisions and demonstrates good insight into disease process. Alert and Oriented x 3. pleasant and cooperative. General Notes: Upon inspection today patient's wound bed actually showed signs of good epithelialization and granulation at this time on both lower extremities. He seems to be making good progress which is excellent news. With that being said he still has a lot of edema and again more having him see vascular for consult in order to evaluate and see whether or not they can help with his  edema 1 way or another. Potentially with some type of surgical intervention but again I am not even sure if that is an option for him which she will have to see. Integumentary (Hair, Skin) Wound #8 status is Open. Original cause of wound was Blister. The wound is located on the Left,Posterior Lower Leg. The wound measures 3cm length x 1.2cm width x 0.1cm depth; 2.827cm^2 area and 0.283cm^3 volume. There is Fat Layer (Subcutaneous Tissue) Exposed exposed. There is no tunneling or undermining noted. There is a small amount of serous drainage noted. The wound margin is flat and intact. There is large (67-100%) red granulation within the wound bed. There is no necrotic tissue within the wound bed. Other Condition(s) Patient presents with Lymphedema located on the Right Leg. Assessment Active Problems ICD-10 Type 2 diabetes mellitus with other skin ulcer Lymphedema, not elsewhere classified Venous insufficiency (chronic) (peripheral) Non-pressure chronic ulcer of other part of left lower leg with fat layer exposed Essential (primary) hypertension Plan Wound Cleansing: Wound #8 Left,Posterior Lower Leg: May shower with protection. - Please do not get your wrap wet Primary Wound Dressing: Wound #8 Left,Posterior Lower Leg: Silver Alginate Other: - adaptic contact layer [name], [name] ([id number]) Secondary Dressing: Wound #8 Left,Posterior Lower Leg: ABD pad XtraSorb Dressing Change Frequency: Wound #8 Left,Posterior Lower Leg: Change dressing  every week Follow-up Appointments: Wound #8 Left,Posterior Lower Leg: Return Appointment in 1 week. Edema Control: Wound #8 Left,Posterior Lower Leg: 4 Layer Compression System - Bilateral - unna to anchor 1. At this point my suggestion is good to be that we go ahead and continue with the current wound care measures including the silver alginate dressing followed by Lauraine Rinne at this point as that seems to be doing well for him bilaterally. 2. I  am also going to suggest that we continue with the 4 layer compression wraps bilaterally which also seems to be beneficial for him at this time. 3. I would recommend that we go ahead and have him continue to elevate his legs as much as possible when he is seated in order to ensure that he is helping as much as he can in that regard with edema control as well. 4. Also suggest that he should continue to use his lymphedema pumps twice a day I know he is not seeing a tremendous amount of improvement but I do believe this helps some nonetheless. 5. We are also awaiting the evaluation by vascular in order to evaluate and see if there is anything they can do from a venous interventional standpoint to help with his edema control. We will see patient back for reevaluation in 1 week here in the clinic. If anything worsens or changes patient will contact our office for additional recommendations. Electronic Signature(s) Signed: 03/25/2019 1:27:30 PM By: Worthy Keeler PA-C Entered By: Worthy Keeler on 03/25/2019 13:27:30 [name] ([id number]) -------------------------------------------------------------------------------- ROS/PFSH Details Patient Name: [name] Date of Service: 03/25/2019 8:45 AM Medical Record Number: [id number] Patient Account Number: [id number] Date of Birth/Sex: Oct 06, 1950 (68 y.o. M) Treating RN: Harold Barban Primary Care Provider: SYSTEM, PCP Other Clinician: Referring Provider: Referral, Self Treating Provider/Extender: STONE III, [name] Weeks in Treatment: 19 Information Obtained From Patient Constitutional Symptoms (General Health) Complaints and Symptoms: Negative for: Fatigue; Fever; Chills; Marked Weight Change Respiratory Complaints and Symptoms: Negative for: Chronic or frequent coughs; Shortness of Breath Cardiovascular Complaints and Symptoms: Positive for: LE edema Negative for: Chest pain Medical History: Positive for: Hypertension; Peripheral  Venous Disease Negative for: Angina; Arrhythmia; Congestive Heart Failure; Coronary Artery Disease; Deep Vein Thrombosis; Hypotension; Myocardial Infarction; Peripheral Arterial Disease; Phlebitis; Vasculitis Psychiatric Complaints and Symptoms: Negative for: Anxiety; Claustrophobia Endocrine Medical History: Positive for: Type II Diabetes Treated with: Oral agents Integumentary (Skin) Medical History: Negative for: History of Burn; History of pressure wounds Neurologic Medical History: Positive for: Neuropathy Negative for: Dementia; Quadriplegia; Paraplegia; Seizure Disorder Oncologic Medical History: Positive for: Received Chemotherapy [name], [name] ([id number]) Negative for: Received Radiation Past Medical History Notes: 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 Immunizations Pneumococcal Vaccine: Received Pneumococcal Vaccination: Yes Immunization Notes: up to date Implantable Devices None Family and Social History Cancer: Yes - Mother; Diabetes: No; Heart Disease: No; Hereditary Spherocytosis: No; Hypertension: No; Kidney Disease: No; Lung Disease: No; Seizures: No; Stroke: Yes - Father; Thyroid Problems: No; Tuberculosis: No; Former smoker - quit 5 years ago; Marital Status - Divorced; Alcohol Use: Never; Drug Use: No History; Caffeine Use: Daily; Financial Concerns: No; Food, Clothing or Shelter Needs: No; Support System Lacking: No; Transportation Concerns: No Physician Affirmation I have reviewed and agree with the above information. Electronic Signature(s) Signed: 03/25/2019 4:27:39 PM By: Harold Barban Signed: 03/25/2019 4:45:50 PM By: Worthy Keeler PA-C Entered By: Worthy Keeler on 03/25/2019 13:25:36 [name] ([id number]) -------------------------------------------------------------------------------- SuperBill Details Patient Name: [name] Date of Service: 03/25/2019 Medical Record  Number: [id number] Patient Account Number: [id number] Date of  Birth/Sex: [birth year] (68 y.o. M) Treating RN: Harold Barban Primary Care Provider: SYSTEM, PCP Other Clinician: Referring Provider: Referral, Self Treating Provider/Extender: STONE III, [name] Weeks in Treatment: 19 Diagnosis Coding ICD-10 Codes Code Description E11.622 Type 2 diabetes mellitus with other skin ulcer I89.0 Lymphedema, not elsewhere classified I87.2 Venous insufficiency (chronic) (peripheral) L97.822 Non-pressure chronic ulcer of other part of left lower leg with fat layer exposed I10 Essential (primary) hypertension Facility Procedures CPT4 Code: [id number] Description: 99213 - WOUND CARE VISIT-LEV 3 EST PT Modifier: Quantity: 1 Physician Procedures CPT4 Code Description: [id number] - WC PHYS LEVEL 4 - EST PT ICD-10 Diagnosis Description E11.622 Type 2 diabetes mellitus with other skin ulcer I89.0 Lymphedema, not elsewhere classified I87.2 Venous insufficiency (chronic) (peripheral) L97.822  Non-pressure chronic ulcer of other part of left lower leg wit Modifier: h fat layer expos Quantity: 1 ed Electronic Signature(s) Signed: 03/25/2019 1:27:42 PM By: Worthy Keeler PA-C Entered By: Worthy Keeler on 03/25/2019 13:27:42

## 2019-03-25 NOTE — Progress Notes (Addendum)
AHSIR, SIMA ([id number]) Visit Report for 03/25/2019 Arrival Information Details Patient Name: [name], [name] Date of Service: 03/25/2019 8:45 AM Medical Record Number: [id number] Patient Account Number: [id number] Date of Birth/Sex: September 01, 1950 (68 y.o. M) Treating RN: Montey Hora Primary Care Hernan Turnage: SYSTEM, PCP Other Clinician: Referring Cornellius Kropp: Referral, Self Treating Tiphany Fayson/Extender: STONE III, [name] Weeks in Treatment: 19 Visit Information History Since Last Visit Added or deleted any medications: No Patient Arrived: Ambulatory Any new allergies or adverse reactions: No Arrival Time: 08:48 Had a fall or experienced change in No Accompanied By: self activities of daily living that may affect Transfer Assistance: None risk of falls: Patient Identification Verified: Yes Signs or symptoms of abuse/neglect since last visito No Secondary Verification Process Completed: Yes Hospitalized since last visit: No Patient Has Alerts: Yes Implantable device outside of the clinic excluding No Patient Alerts: DMII cellular tissue based products placed in the center since last visit: Has Dressing in Place as Prescribed: Yes Has Compression in Place as Prescribed: Yes Pain Present Now: No Electronic Signature(s) Signed: 03/25/2019 4:21:28 PM By: Montey Hora Entered By: Montey Hora on 03/25/2019 08:49:29 [name] ([id number]) -------------------------------------------------------------------------------- Clinic Level of Care Assessment Details Patient Name: [name] Date of Service: 03/25/2019 8:45 AM Medical Record Number: [id number] Patient Account Number: [id number] Date of Birth/Sex: November 02, 1950 (68 y.o. M) Treating RN: Harold Barban Primary Care Kyle Luppino: SYSTEM, PCP Other Clinician: Referring Christinia Lambeth: Referral, Self Treating Meredyth Hornung/Extender: STONE III, [name] Weeks in Treatment: 19 Clinic Level of Care Assessment Items TOOL 4 Quantity Score []  - Use  when only an EandM is performed on FOLLOW-UP visit 0 ASSESSMENTS - Nursing Assessment / Reassessment X - Reassessment of Co-morbidities (includes updates in patient status) 1 10 X- 1 5 Reassessment of Adherence to Treatment Plan ASSESSMENTS - Wound and Skin Assessment / Reassessment []  - Simple Wound Assessment / Reassessment - one wound 0 X- 2 5 Complex Wound Assessment / Reassessment - multiple wounds []  - 0 Dermatologic / Skin Assessment (not related to wound area) ASSESSMENTS - Focused Assessment []  - Circumferential Edema Measurements - multi extremities 0 []  - 0 Nutritional Assessment / Counseling / Intervention []  - 0 Lower Extremity Assessment (monofilament, tuning fork, pulses) []  - 0 Peripheral Arterial Disease Assessment (using hand held doppler) ASSESSMENTS - Ostomy and/or Continence Assessment and Care []  - Incontinence Assessment and Management 0 []  - 0 Ostomy Care Assessment and Management (repouching, etc.) PROCESS - Coordination of Care X - Simple Patient / Family Education for ongoing care 1 15 []  - 0 Complex (extensive) Patient / Family Education for ongoing care []  - 0 Staff obtains Programmer, systems, Records, Test Results / Process Orders []  - 0 Staff telephones HHA, Nursing Homes / Clarify orders / etc []  - 0 Routine Transfer to another Facility (non-emergent condition) []  - 0 Routine Hospital Admission (non-emergent condition) []  - 0 New Admissions / Biomedical engineer / Ordering NPWT, Apligraf, etc. []  - 0 Emergency Hospital Admission (emergent condition) X- 1 10 Simple Discharge Coordination Staples, NISHAD ([id number]) []  - 0 Complex (extensive) Discharge Coordination PROCESS - Special Needs []  - Pediatric / Minor Patient Management 0 []  - 0 Isolation Patient Management []  - 0 Hearing / Language / Visual special needs []  - 0 Assessment of Community assistance (transportation, D/C planning, etc.) []  - 0 Additional assistance / Altered  mentation []  - 0 Support Surface(s) Assessment (bed, cushion, seat, etc.) INTERVENTIONS - Wound Cleansing / Measurement []  - Simple Wound Cleansing - one wound 0 X- 2 5 Complex Wound Cleansing -  multiple wounds X- 1 5 Wound Imaging (photographs - any number of wounds) []  - 0 Wound Tracing (instead of photographs) []  - 0 Simple Wound Measurement - one wound X- 2 5 Complex Wound Measurement - multiple wounds INTERVENTIONS - Wound Dressings X - Small Wound Dressing one or multiple wounds 1 10 []  - 0 Medium Wound Dressing one or multiple wounds []  - 0 Large Wound Dressing one or multiple wounds []  - 0 Application of Medications - topical []  - 0 Application of Medications - injection INTERVENTIONS - Miscellaneous []  - External ear exam 0 []  - 0 Specimen Collection (cultures, biopsies, blood, body fluids, etc.) []  - 0 Specimen(s) / Culture(s) sent or taken to Lab for analysis []  - 0 Patient Transfer (multiple staff / Civil Service fast streamer / Similar devices) []  - 0 Simple Staple / Suture removal (25 or less) []  - 0 Complex Staple / Suture removal (26 or more) []  - 0 Hypo / Hyperglycemic Management (close monitor of Blood Glucose) []  - 0 Ankle / Brachial Index (ABI) - do not check if billed separately X- 1 5 Vital Signs KELDEN, OREJEL ([id number]) Has the patient been seen at the hospital within the last three years: Yes Total Score: 90 Level Of Care: New/Established - Level 3 Electronic Signature(s) Signed: 03/25/2019 4:27:39 PM By: Harold Barban Entered By: Harold Barban on 03/25/2019 09:11:26 [name] ([id number]) -------------------------------------------------------------------------------- Encounter Discharge Information Details Patient Name: [name] Date of Service: 03/25/2019 8:45 AM Medical Record Number: [id number] Patient Account Number: [id number] Date of Birth/Sex: 27-May-1950 (68 y.o. M) Treating RN: Harold Barban Primary Care Olufemi Mofield: SYSTEM, PCP  Other Clinician: Referring Latica Hohmann: Referral, Self Treating Teena Mangus/Extender: STONE III, [name] Weeks in Treatment: 19 Encounter Discharge Information Items Discharge Condition: Stable Ambulatory Status: Ambulatory Discharge Destination: Home Transportation: Private Auto Accompanied By: self Schedule Follow-up Appointment: Yes Clinical Summary of Care: Electronic Signature(s) Signed: 03/25/2019 4:27:39 PM By: Harold Barban Entered By: Harold Barban on 03/25/2019 09:16:51 [name] ([id number]) -------------------------------------------------------------------------------- Lower Extremity Assessment Details Patient Name: [name] Date of Service: 03/25/2019 8:45 AM Medical Record Number: [id number] Patient Account Number: [id number] Date of Birth/Sex: [birth year] (68 y.o. M) Treating RN: Montey Hora Primary Care Lanise Mergen: SYSTEM, PCP Other Clinician: Referring Harli Engelken: Referral, Self Treating Zariel Capano/Extender: STONE III, [name] Weeks in Treatment: 19 Edema Assessment Assessed: [Left: No] [Right: No] Edema: [Left: Yes] [Right: Yes] Calf Left: Right: Point of Measurement: 35 cm From Medial Instep 42 cm 43 cm Ankle Left: Right: Point of Measurement: 11 cm From Medial Instep 31.5 cm 30.3 cm Vascular Assessment Pulses: Dorsalis Pedis Palpable: [Left:Yes] [Right:Yes] Electronic Signature(s) Signed: 03/25/2019 4:21:28 PM By: Montey Hora Entered By: Montey Hora on 03/25/2019 08:57:29 [name] ([id number]) -------------------------------------------------------------------------------- Multi Wound Chart Details Patient Name: [name] Date of Service: 03/25/2019 8:45 AM Medical Record Number: [id number] Patient Account Number: [id number] Date of Birth/Sex: 1951/01/18 (68 y.o. M) Treating RN: Harold Barban Primary Care Aidel Davisson: SYSTEM, PCP Other Clinician: Referring Alecxis Baltzell: Referral, Self Treating Anthonia Monger/Extender: STONE III, [name] Weeks in  Treatment: 19 Vital Signs Height(in): 73 Pulse(bpm): 6 Weight(lbs): 300 Blood Pressure(mmHg): 118/54 Body Mass Index(BMI): 40 Temperature(F): 98.4 Respiratory Rate 18 (breaths/min): Photos: [N/A:N/A] Wound Location: Left Lower Leg - Posterior N/A N/A Wounding Event: Blister N/A N/A Primary Etiology: Diabetic Wound/Ulcer of the N/A N/A Lower Extremity Comorbid History: Hypertension, Peripheral N/A N/A Venous Disease, Type II Diabetes, Neuropathy, Received Chemotherapy Date Acquired: 01/01/2019 N/A N/A Weeks of Treatment: 9 N/A N/A Wound Status: Open N/A N/A Measurements L x W x D 3x1.2x0.1 N/A  N/A (cm) Area (cm) : 2.827 N/A N/A Volume (cm) : 0.283 N/A N/A % Reduction in Area: -5.90% N/A N/A % Reduction in Volume: -6.00% N/A N/A Classification: Grade 1 N/A N/A Exudate Amount: Small N/A N/A Exudate Type: Serous N/A N/A Exudate Color: amber N/A N/A Wound Margin: Flat and Intact N/A N/A Granulation Amount: Large (67-100%) N/A N/A Granulation Quality: Red N/A N/A Necrotic Amount: None Present (0%) N/A N/A Exposed Structures: Fat Layer (Subcutaneous N/A N/A Tissue) Exposed: Yes Fascia: No Tendon: No CYRESS, LASPADA ([id number]) Muscle: No Joint: No Bone: No Epithelialization: Medium (34-66%) N/A N/A Treatment Notes Electronic Signature(s) Signed: 03/25/2019 4:27:39 PM By: Harold Barban Entered By: Harold Barban on 03/25/2019 09:07:46 [name] ([id number]) -------------------------------------------------------------------------------- Multi-Disciplinary Care Plan Details Patient Name: [name] Date of Service: 03/25/2019 8:45 AM Medical Record Number: [id number] Patient Account Number: [id number] Date of Birth/Sex: 09/09/1950 (68 y.o. M) Treating RN: Harold Barban Primary Care Gracee Ratterree: SYSTEM, PCP Other Clinician: Referring Rilley Stash: Referral, Self Treating Zakkery Dorian/Extender: STONE III, [name] Weeks in Treatment: 19 Active Inactive Venous Leg  Ulcer Nursing Diagnoses: Actual venous Insuffiency (use after diagnosis is confirmed) Knowledge deficit related to disease process and management Goals: Patient will maintain optimal edema control Date Initiated: 11/12/2018 Target Resolution Date: 03/08/2019 Goal Status: Active Patient/caregiver will verbalize understanding of disease process and disease management Date Initiated: 11/12/2018 Target Resolution Date: 03/08/2019 Goal Status: Active Interventions: Assess peripheral edema status every visit. Compression as ordered Provide education on venous insufficiency Notes: Wound/Skin Impairment Nursing Diagnoses: Impaired tissue integrity Knowledge deficit related to ulceration/compromised skin integrity Goals: Ulcer/skin breakdown will have a volume reduction of 30% by week 4 Date Initiated: 11/12/2018 Target Resolution Date: 03/08/2019 Goal Status: Active Interventions: Assess patient/caregiver ability to obtain necessary supplies Assess patient/caregiver ability to perform ulcer/skin care regimen upon admission and as needed Assess ulceration(s) every visit Provide education on ulcer and skin care Notes: JAQUALIN, HALPERT ([id number]) Electronic Signature(s) Signed: 03/25/2019 4:27:39 PM By: Harold Barban Entered By: Harold Barban on 03/25/2019 09:07:38 [name] ([id number]) -------------------------------------------------------------------------------- Non-Wound Condition Assessment Details Patient Name: [name] Date of Service: 03/25/2019 8:45 AM Medical Record Number: [id number] Patient Account Number: [id number] Date of Birth/Sex: Oct 20, 1950 (68 y.o. M) Treating RN: Montey Hora Primary Care Quanah Majka: SYSTEM, PCP Other Clinician: Referring Marlise Fahr: Referral, Self Treating Twilia Yaklin/Extender: STONE III, [name] Weeks in Treatment: 19 Non-Wound Condition: Condition: Lymphedema Location: Leg Side: Right Photos Electronic Signature(s) Signed: 03/25/2019  4:21:28 PM By: Montey Hora Entered By: Montey Hora on 03/25/2019 09:03:41 [name] ([id number]) -------------------------------------------------------------------------------- Pain Assessment Details Patient Name: [name] Date of Service: 03/25/2019 8:45 AM Medical Record Number: [id number] Patient Account Number: [id number] Date of Birth/Sex: [birth year] (68 y.o. M) Treating RN: Montey Hora Primary Care Romey Mathieson: SYSTEM, PCP Other Clinician: Referring Loveah Like: Referral, Self Treating Laurinda Carreno/Extender: STONE III, [name] Weeks in Treatment: 19 Active Problems Location of Pain Severity and Description of Pain Patient Has Paino No Site Locations Pain Management and Medication Current Pain Management: Electronic Signature(s) Signed: 03/25/2019 4:21:28 PM By: Montey Hora Entered By: Montey Hora on 03/25/2019 08:49:36 [name] ([id number]) -------------------------------------------------------------------------------- Patient/Caregiver Education Details Patient Name: [name] Date of Service: 03/25/2019 8:45 AM Medical Record Number: [id number] Patient Account Number: [id number] Date of Birth/Gender: 09-17-1950 (68 y.o. M) Treating RN: Harold Barban Primary Care Physician: SYSTEM, PCP Other Clinician: Referring Physician: Referral, Self Treating Physician/Extender: Melburn Hake, [name] Weeks in Treatment: 19 Education Assessment Education Provided To: Patient Education Topics Provided Venous: Handouts: Controlling Swelling with Compression Stockings Methods: Demonstration, Explain/Verbal Responses: State content correctly Wound/Skin  Impairment: Handouts: Caring for Your Ulcer Methods: Demonstration, Explain/Verbal Responses: State content correctly Electronic Signature(s) Signed: 03/25/2019 4:27:39 PM By: Harold Barban Entered By: Harold Barban on 03/25/2019 09:08:34 [name]  ([id number]) -------------------------------------------------------------------------------- Wound Assessment Details Patient Name: [name] Date of Service: 03/25/2019 8:45 AM Medical Record Number: [id number] Patient Account Number: [id number] Date of Birth/Sex: 28-Apr-1951 (68 y.o. M) Treating RN: Montey Hora Primary Care Deaisha Welborn: SYSTEM, PCP Other Clinician: Referring Sama Arauz: Referral, Self Treating Ilario Dhaliwal/Extender: STONE III, [name] Weeks in Treatment: 19 Wound Status Wound Number: 8 Primary Diabetic Wound/Ulcer of the Lower Extremity Etiology: Wound Location: Left Lower Leg - Posterior Wound Open Wounding Event: Blister Status: Date Acquired: 01/01/2019 Comorbid Hypertension, Peripheral Venous Disease, Type Weeks Of Treatment: 9 History: II Diabetes, Neuropathy, Received Clustered Wound: No Chemotherapy Photos Wound Measurements Length: (cm) 3 % Reduction Width: (cm) 1.2 % Reduction Depth: (cm) 0.1 Epithelializ Area: (cm) 2.827 Tunneling: Volume: (cm) 0.283 Undermining in Area: -5.9% in Volume: -6% ation: Medium (34-66%) No : No Wound Description Classification: Grade 1 Foul Odor Af Wound Margin: Flat and Intact Slough/Fibri Exudate Amount: Small Exudate Type: Serous Exudate Color: amber ter Cleansing: No no No Wound Bed Granulation Amount: Large (67-100%) Exposed Structure Granulation Quality: Red Fascia Exposed: No Necrotic Amount: None Present (0%) Fat Layer (Subcutaneous Tissue) Exposed: Yes Tendon Exposed: No Muscle Exposed: No Joint Exposed: No Bone Exposed: No Treatment Notes LUZ, EATHERLY ([id number]) Wound #8 (Left, Posterior Lower Leg) Notes adapitc, silvercel,ABD, xtrasorb, 4 layer Bilateral wrap with unna to anchor Electronic Signature(s) Signed: 03/25/2019 4:21:28 PM By: Montey Hora Entered By: Montey Hora on 03/25/2019 09:02:12 [name]  ([id number]) -------------------------------------------------------------------------------- [place] Details Patient Name: [name] Date of Service: 03/25/2019 8:45 AM Medical Record Number: [id number] Patient Account Number: [id number] Date of Birth/Sex: 1951/01/25 (68 y.o. M) Treating RN: Montey Hora Primary Care Brailey Buescher: SYSTEM, PCP Other Clinician: Referring Amaranta Mehl: Referral, Self Treating Bria Portales/Extender: STONE III, [name] Weeks in Treatment: 19 Vital Signs Time Taken: 08:49 Temperature (F): 98.4 Height (in): 73 Pulse (bpm): 57 Weight (lbs): 300 Respiratory Rate (breaths/min): 18 Body Mass Index (BMI): 39.6 Blood Pressure (mmHg): 118/54 Reference Range: 80 - 120 mg / dl Electronic Signature(s) Signed: 03/25/2019 4:21:28 PM By: Montey Hora Entered By: Montey Hora on 03/25/2019 08:51:38

## 2019-04-01 ENCOUNTER — Other Ambulatory Visit: Payer: Self-pay

## 2019-04-01 ENCOUNTER — Encounter: Payer: PPO | Admitting: Physician Assistant

## 2019-04-01 DIAGNOSIS — L97222 Non-pressure chronic ulcer of left calf with fat layer exposed: Secondary | ICD-10-CM | POA: Diagnosis not present

## 2019-04-01 DIAGNOSIS — E11622 Type 2 diabetes mellitus with other skin ulcer: Secondary | ICD-10-CM | POA: Diagnosis not present

## 2019-04-01 NOTE — Progress Notes (Signed)
ALIC, OBREMSKI ([id number]) Visit Report for 04/01/2019 Chief Complaint Document Details Patient Name: [name], [name] Date of Service: 04/01/2019 3:15 PM Medical Record Number: [id number] Patient Account Number: [id number] Date of Birth/Sex: 1950/06/13 (68 y.o. M) Treating RN: Harold Barban Primary Care Provider: SYSTEM, PCP Other Clinician: Referring Provider: Referral, Self Treating Provider/Extender: Melburn Hake, Carletta Feasel Weeks in Treatment: 20 Information Obtained from: Patient Chief Complaint Left LE ulcers Electronic Signature(s) Signed: 04/01/2019 3:35:13 PM By: Worthy Keeler PA-C Entered By: Worthy Keeler on 04/01/2019 15:35:12 [name] ([id number]) -------------------------------------------------------------------------------- HPI Details Patient Name: [name] Date of Service: 04/01/2019 3:15 PM Medical Record Number: [id number] Patient Account Number: [id number] Date of Birth/Sex: Apr 24, 1951 (68 y.o. M) Treating RN: Harold Barban Primary Care Provider: SYSTEM, PCP Other Clinician: Referring Provider: Referral, Self Treating Provider/Extender: STONE III, Ashaad Gaertner Weeks in Treatment: 20 History of Present Illness HPI Description: 68 year old male who presented to the ER with bilateral lower extremity blisters which had started last week. he has a past medical history of leukemia, diabetes mellitus, hypertension, edema of both lower extremities, his recurrent skin infections, peripheral vascular disease, coronary artery disease, congestive heart failure and peripheral neuropathy. in the ER he was given Rocephin and put on Silvadene cream. he was put on oral doxycycline and was asked to follow-up with the [facility]. His last hemoglobin A1c was 6.6 in December and he checks his blood sugar once a week. He does not have any physicians outside the [place] system. He does not recall any vascular duplex studies done either for arterial or venous disease but was told to wear  compression stockings which he does not use 05/30/2016 -- we have not yet received any of his notes from the [facility] hospital system and his arterial and venous duplex studies are scheduled here in [place] around mid February. We are unable to have his insurance accepted by home health agencies and hence he is getting dressings only once a week. 06/06/16 -- -- I received a call from the patient's PCP at the [facility] and spoke to Dr. Garvin Fila, phone number [phone] and fax number [phone]. She confirmed that no vascular testing was done over the last 5 years and she would be happy to do them if the patient did want them to be done at the [place] and we could fax him a request. Readmission: 68 year old male seen by as in February of this year and was referred to vein and vascular for studies and opinion from the vascular surgeons. The patient returns today with a fresh problem having had blisters on his left lower extremity which have been there for about 5 days and he clearly states that he has been wearing his compression stockings as advised though he could not read the moderate compression and has been wearing light compression. Review of his electronic medical records note that he had lower extremity arterial duplex examination done on 06/23/2016 which showed no hemodynamically significant stenosis in the bilateral lower extremity arterial system. He also had a lower extremity venous reflux examination done on 07/07/2016 and it was noted that he had venous incompetence in the right great saphenous vein and bilateral common femoral veins. Patient was seen by Dr. Tamala Julian on the same day and for some reason his notes do not reflect the venous studies or the arterial studies and he recommended patient do a venous duplex ultrasound to look for reflux and return to see him.he would also consider a lymph pump if required. The patient was told that  his workup was normal and hence the  patient canceled his follow-up appointment. 02/03/17 on evaluation today patient left medial lower extremity blister appears to be doing about the same. It is still continuing to drain and there's still the blistered skin covering the wound bed which is making it difficult for the alternate to do its job. Fortunately there is no evidence of cellulitis. No fevers chills noted. Patient states in general he is not having any significant discomfort. Patient's lower extremity arterial duplex exam revealed that patient was hemodynamically stable with no evidence of stenosis in regard to the bilateral lower extremities. The lower extremity venous reflux exam revealed the patient had venous incontinence noted in the right greater saphenous and bilateral common femoral vein. There is no evidence of deep or superficial vein thrombosis in the bilateral lower extremities. Readmission: [name], [name] ([id number]) 11/12/18 Patient presents for evaluation our clinic today concerning issues that he is having with his left lower extremity. He tells me that a couple weeks ago he began developing blisters on the left lower extremity along with increased swelling. He typically wears his compression stockings on a regular basis is previously been evaluated both here as well is with vascular surgery they would recommend lymphedema pumps but unfortunately that somehow fell through and he never heard anything back from that. Nonetheless I think lymphedema pumps would be beneficial for this patient. He does have a history of hypertension and diabetes. Obviously the chronic venous stasis and lymphedema as well. At this point the blisters have been given in more trouble he states sometimes when the blisters openings able to clean it down with alcohol and it will dry out and do well. Unfortunately that has not been the case this time. He is having some discomfort although this mean these with cleaning the areas he doesn't have  discomfort just on a regular basis. He has not been able to wear his compression stockings since the blisters arose due to the fact that of course it will drain into the socks causing additional issues and he didn't have any way to wrap this otherwise. He has increased to taking his Lasix every day instead of every other day. He sees his primary care provider later this month as well. No fevers, chills, nausea, or vomiting noted at this time. 11/19/18-Patient returns at 1 week, per intake RN the amount of seepage into the compression wraps was definitely improved, overall all the wounds are measuring smaller but continuing silver alginate to the wounds as primary dressing 11/26/18 on evaluation today patient appears to be doing quite well in regard to his left lower Trinity ulcers. In fact of the areas that were noted initially he only has two regions still open. There is no evidence of active infection at this time. He still is not heard anything from the company regarding lymphedema pumps as of yet. Again as previously seen vascular they have not recommended any surgical intervention. 12/03/2018 on evaluation today patient actually appears to be doing quite well with regard to his lower extremity ulcers. In fact most of the areas appear to be healed the one spot which does not seem to be completely healed I am unsure of whether or not this is really draining that much but nonetheless there does not appear to be any signs of infection or significant drainage at this point. There is no sign of fever, chills, nausea, vomiting, or diarrhea. Overall I am pleased with how things have progressed I think is very close  to being able to transition to his home compression stockings. 12/10/2018 upon evaluation today patient appears to be doing quite well with regard to his left lower extremity. He has been tolerating the dressing changes without complication. Fortunately there is no signs of active infection at  this time. He appears after thorough evaluation of his leg to only have 1 small area that remains open at this point everything else appears to be almost completely closed. He still have significant swelling of the left lower extremity. We had discussed discussing this with his primary care provider he is not able to see her in person they were at the [facility] and right now the [place] is not seeing patients on site. According to the patient anyway. Subsequently he did speak with her apparently and his primary care provider feels that he may likely have a DVT. With that being said she has not seen his leg she is just going off of his history. Nonetheless that is a concern that the patient now has as well and while I do not feel the DVT is likely we can definitely ensure that that is not the case I will go ahead and see about putting that order in today. Nonetheless otherwise I am in a recommend that we continue with the current wound care measures including the compression therapy most likely. We just need to ensure that his leg is indeed free of any DVTs. 12/17/2018 on evaluation today patient actually appears to be completely healed today. He does have 2 very small areas of blistering although this is not anything too significant at this point which is good news. With that being said I am in agreement with the fact that I think he is completely healed at this point. He does want to get back into his compression stocking. The good news is we have gotten approval from insurance for his lymphedema pumps we received a letter since last saw him last week. The other good news is his study did come back and showed no evidence of a DVT. 12/20/2018 on evaluation today patient presents for follow-up concerning his ongoing issues with his left lower extremity. He was actually discharged last Friday and did fairly well until he states blisters opened this morning. He tells me he has been wearing his compression  stocking although he has a hard time getting this on. There does not appear to be any signs of active infection at this time. No fevers, chills, nausea, vomiting, or diarrhea. 12/27/2018 on evaluation today patient appears to be doing very well with regard to his swelling of the left lower extremity the 4 layer compression wrap seems to have been beneficial for him. Fortunately there is no signs of active infection at this time. Patient has been tolerating the compression wrap without complication and his foot swelling in particular appears to be greatly improved. He does still have a wound on the lateral portion of his left leg I believe this is more of a blister that has now reopened. 01/03/2019 on evaluation today patient actually appears to be doing excellent in regard to his left lower extremity. He did receive his compression pumps and is actually use this 7 times since he was last here in the office. On top of the compression wrap he is now roughly 3 cm better at the calf and 2 cm better at the ankle he also states that his foot seem to go an issue better without even having to use a shoe horn.  Obviously I think this is all evidence that he is doing excellent in this regard. The other good news is he does not appear to have anything open today as far as wounds are concerned. [name], [name] ([id number]) 01/15/2019 on evaluation today patient appears to be doing more poorly yet again with regard to his left lower extremity. He has developed new wounds again after being discharged just recently. Unfortunately this continues to be the case that he will heal and then have subsequent new wounds. The last time I was hopeful that he may not end up coming back too quickly especially since he states he has been using his lymphedema pumps along with wearing his compression. Nonetheless he had a blister on the back of his leg that popped up on the left and this has opened up into an ulceration it is quite  painful. 01/22/19 on evaluation today patient actually appears to be doing well with regard to his wound on the left lower extremity. He's been tolerating the dressing changes without complication including the compression wrap in the wound appears to be significantly smaller today which is great news. Overall very pleased in this regard. 01/29/2019 on evaluation today patient appears to be doing well with regard to his left posterior lower extremity ulcer. He has been tolerating the dressing changes without complication. This is not completely healed but is getting much closer. We did order a Farrow wrap 4000 for him he has received this and has it with him today although I am not sure we are quite ready to start him on that as of yet. We are very close. 02/05/2019 on evaluation today patient actually appears to be doing quite well with regard to his left posterior lower extremity ulcer. He still has a very tiny opening remaining but the fortunate thing is he seems to be healing quite nicely. He also did get his Farrow wrap which I am hoping will help with his edema control as well at home. Fortunately there is no evidence of active infection. 02/12/2019 patient and fortunately appears to be doing poorly in regard to his wounds of the left lower extremity. He was very close to healing therefore we attempted to use his Velcro compression wraps continuing with lymphedema pumps at home. Unfortunately that does not seem to have done very well for him. He tells me that he wore them all the time but again I am not sure why if that is the case that he is having such significant edema. He is still on his fluid pills as well. With that being said there is no obvious sign of infection although I do wonder about the possibility of infection at this time as well. 02/19/2019 unfortunately upon evaluation today patient appears to be doing more poorly with regard to his left lower extremity. He is not showing signs  of significant improvement and I think the biggest issue here is that he does have an infection that appears to likely be Pseudomonas. That is based on the blue-green drainage that were noted at this time. Unfortunately the antibiotic that has been on is not going to take care of this at all. I think they will get a need to switch him to either Levaquin or Cipro and this was discussed with the patient. 02/26/2019 on evaluation today patient's lower extremity on the left appears to be doing significantly better as compared to last evaluation. Fortunately there is no signs of active infection at this time. He has been tolerating the  compression wrap without complication in fact he made it the whole week at this point. He is showing signs of excellent improvement I am very happy in this regard. With that being said he is having some issues with infection we did review the results of his culture which I noted today. He did have a positive finding for Enterobacter as well as Alcaligenes faecalis. Fortunately the Levaquin that I placed him on will work for both which is great news. There is no signs of systemic infection at this point. 10/30; left posterior leg wound in the setting of very significant edema and what looks like chronic venous inflammation. He has compression pumps but does not use them. We have been using 3 layer compression. Silver alginate to the wound as the primary dressing 03/18/2019 on evaluation today patient appears to be doing a little better compared to last time I saw him. He really has not been using his compression pumps he tells me that he is having too much discomfort. He has been keeping his wraps on however. He is only been taking his fluid pills every other day because he states they are not really helping and he has an appointment with his primary care provider at the [facility] tomorrow. Subsequently the wound itself on the left lower extremity does seem to be greatly improved  compared to previous. 03/25/2019 on evaluation today patient appears to be doing better with regard to his wounds on the bilateral lower extremities. The left is doing excellent the right is also doing better although both still do show some signs of open wounds noted at this point unfortunately. Fortunately there is no signs of active infection at this time. The patient also is not really having any significant pain which is good news. Unfortunately there was some confusion with the referral on vascular disease and as far as getting the patient scheduled there can be contacting him later today to do this fortunately we got this straightened out. 04/01/2019 on evaluation today patient appears to be doing no fevers, chills, nausea, vomiting, or diarrhea. Excellent at this time with regard to his lower extremities. There does not appear to be any open wound at this point which is good news. Fortunately is also no signs of active infection at this time. Overall feel like the patient has done excellent with the compression the problem is every time we got him to this point and then subsequently go to using his own compression [place], [name] ([id number]) things just go right back to where they were. I am not sure how to address this we can try to get an appointment with vascular for 2 weeks now they have yet to call him. Obviously this has become frustrating for the patient as well. I think the issue has just been an honest error as far as scheduling is concerned but nonetheless still worn out the point where I am unsure of which direction we should take. Electronic Signature(s) Signed: 04/01/2019 4:15:11 PM By: Worthy Keeler PA-C Entered By: Worthy Keeler on 04/01/2019 16:15:10 [name] ([id number]) -------------------------------------------------------------------------------- Physical Exam Details Patient Name: [name] Date of Service: 04/01/2019 3:15 PM Medical Record Number:  [id number] Patient Account Number: [id number] Date of Birth/Sex: Apr 24, 1951 (68 y.o. M) Treating RN: Harold Barban Primary Care Provider: SYSTEM, PCP Other Clinician: Referring Provider: Referral, Self Treating Provider/Extender: STONE III, Diego Ulbricht Weeks in Treatment: 58 Constitutional Well-nourished and well-hydrated in no acute distress. Respiratory normal breathing without difficulty. clear to auscultation bilaterally. Cardiovascular regular  rate and rhythm with normal S1, S2. Psychiatric this patient is able to make decisions and demonstrates good insight into disease process. Alert and Oriented x 3. pleasant and cooperative. Notes Patient's wounds again have completely resolved and seem to be doing excellent as far as that is concerned based on what I am seeing today. Fortunately there is no evidence of active infection either which is also good news. Overall I still think he is going require compression wraps to keep the edema under control and prevent this from reopening immediately which is what happened in the past 2 times he is healed and we transitioned into his home compression. Electronic Signature(s) Signed: 04/01/2019 4:15:53 PM By: Worthy Keeler PA-C Entered By: Worthy Keeler on 04/01/2019 16:15:52 [name] ([id number]) -------------------------------------------------------------------------------- Physician Orders Details Patient Name: [name] Date of Service: 04/01/2019 3:15 PM Medical Record Number: [id number] Patient Account Number: [id number] Date of Birth/Sex: [birth year] (68 y.o. M) Treating RN: Harold Barban Primary Care Provider: SYSTEM, PCP Other Clinician: Referring Provider: Referral, Self Treating Provider/Extender: STONE III, Trenda Corliss Weeks in Treatment: 20 Verbal / Phone Orders: No Diagnosis Coding ICD-10 Coding Code Description E11.622 Type 2 diabetes mellitus with other skin ulcer I89.0 Lymphedema, not elsewhere classified I87.2 Venous  insufficiency (chronic) (peripheral) L97.822 Non-pressure chronic ulcer of other part of left lower leg with fat layer exposed I10 Essential (primary) hypertension Follow-up Appointments o Return Appointment in 1 week. Edema Control o 4 Layer Compression System - Bilateral - Unna to anchor Electronic Signature(s) Signed: 04/01/2019 5:37:56 PM By: Harold Barban Entered By: Harold Barban on 04/01/2019 16:13:10 [name] ([id number]) -------------------------------------------------------------------------------- Problem List Details Patient Name: [name] Date of Service: 04/01/2019 3:15 PM Medical Record Number: [id number] Patient Account Number: [id number] Date of Birth/Sex: 1950-10-13 (68 y.o. M) Treating RN: Harold Barban Primary Care Provider: SYSTEM, PCP Other Clinician: Referring Provider: Referral, Self Treating Provider/Extender: Melburn Hake, Secundino Ellithorpe Weeks in Treatment: 20 Active Problems ICD-10 Evaluated Encounter Code Description Active Date Today Diagnosis E11.622 Type 2 diabetes mellitus with other skin ulcer 11/12/2018 No Yes I89.0 Lymphedema, not elsewhere classified 11/12/2018 No Yes I87.2 Venous insufficiency (chronic) (peripheral) 11/12/2018 No Yes L97.822 Non-pressure chronic ulcer of other part of left lower leg with 11/12/2018 No Yes fat layer exposed I10 Essential (primary) hypertension 11/12/2018 No Yes Inactive Problems Resolved Problems Electronic Signature(s) Signed: 04/01/2019 3:35:02 PM By: Worthy Keeler PA-C Entered By: Worthy Keeler on 04/01/2019 15:35:01 [name] ([id number]) -------------------------------------------------------------------------------- Progress Note Details Patient Name: [name] Date of Service: 04/01/2019 3:15 PM Medical Record Number: [id number] Patient Account Number: [id number] Date of Birth/Sex: 1950/05/11 (68 y.o. M) Treating RN: Harold Barban Primary Care Provider: SYSTEM, PCP Other  Clinician: Referring Provider: Referral, Self Treating Provider/Extender: STONE III, Cecilia Vancleve Weeks in Treatment: 20 Subjective Chief Complaint Information obtained from Patient Left LE ulcers History of Present Illness (HPI) 68 year old male who presented to the ER with bilateral lower extremity blisters which had started last week. he has a past medical history of leukemia, diabetes mellitus, hypertension, edema of both lower extremities, his recurrent skin infections, peripheral vascular disease, coronary artery disease, congestive heart failure and peripheral neuropathy. in the ER he was given Rocephin and put on Silvadene cream. he was put on oral doxycycline and was asked to follow-up with the [facility]. His last hemoglobin A1c was 6.6 in December and he checks his blood sugar once a week. He does not have any physicians outside the [place] system. He does not recall any vascular duplex studies done  either for arterial or venous disease but was told to wear compression stockings which he does not use 05/30/2016 -- we have not yet received any of his notes from the [facility] hospital system and his arterial and venous duplex studies are scheduled here in [place] around mid February. We are unable to have his insurance accepted by home health agencies and hence he is getting dressings only once a week. 06/06/16 -- -- I received a call from the patient's PCP at the [name] and spoke to Dr. Garvin Fila, phone number [phone] and fax number [phone]. She confirmed that no vascular testing was done over the last 5 years and she would be happy to do them if the patient did want them to be done at the [place] and we could fax him a request. Readmission: 68 year old male seen by as in February of this year and was referred to vein and vascular for studies and opinion from the vascular surgeons. The patient returns today with a fresh problem having had blisters on his left lower extremity which  have been there for about 5 days and he clearly states that he has been wearing his compression stockings as advised though he could not read the moderate compression and has been wearing light compression. Review of his electronic medical records note that he had lower extremity arterial duplex examination done on 06/23/2016 which showed no hemodynamically significant stenosis in the bilateral lower extremity arterial system. He also had a lower extremity venous reflux examination done on 07/07/2016 and it was noted that he had venous incompetence in the right great saphenous vein and bilateral common femoral veins. Patient was seen by Dr. Tamala Julian on the same day and for some reason his notes do not reflect the venous studies or the arterial studies and he recommended patient do a venous duplex ultrasound to look for reflux and return to see him.he would also consider a lymph pump if required. The patient was told that his workup was normal and hence the patient canceled his follow-up appointment. 02/03/17 on evaluation today patient left medial lower extremity blister appears to be doing about the same. It is still continuing to drain and there's still the blistered skin covering the wound bed which is making it difficult for the alternate to do its job. Fortunately there is no evidence of cellulitis. No fevers chills noted. Patient states in general he is not having any significant discomfort. Patient's lower extremity arterial duplex exam revealed that patient was hemodynamically stable with no evidence of stenosis in regard to the bilateral lower extremities. The lower extremity venous reflux exam revealed the patient had venous incontinence noted in the right greater saphenous [place], [name] ([id number]) and bilateral common femoral vein. There is no evidence of deep or superficial vein thrombosis in the bilateral lower extremities. Readmission: 11/12/18 Patient presents for evaluation our  clinic today concerning issues that he is having with his left lower extremity. He tells me that a couple weeks ago he began developing blisters on the left lower extremity along with increased swelling. He typically wears his compression stockings on a regular basis is previously been evaluated both here as well is with vascular surgery they would recommend lymphedema pumps but unfortunately that somehow fell through and he never heard anything back from that. Nonetheless I think lymphedema pumps would be beneficial for this patient. He does have a history of hypertension and diabetes. Obviously the chronic venous stasis and lymphedema as well. At this point the blisters  have been given in more trouble he states sometimes when the blisters openings able to clean it down with alcohol and it will dry out and do well. Unfortunately that has not been the case this time. He is having some discomfort although this mean these with cleaning the areas he doesn't have discomfort just on a regular basis. He has not been able to wear his compression stockings since the blisters arose due to the fact that of course it will drain into the socks causing additional issues and he didn't have any way to wrap this otherwise. He has increased to taking his Lasix every day instead of every other day. He sees his primary care provider later this month as well. No fevers, chills, nausea, or vomiting noted at this time. 11/19/18-Patient returns at 1 week, per intake RN the amount of seepage into the compression wraps was definitely improved, overall all the wounds are measuring smaller but continuing silver alginate to the wounds as primary dressing 11/26/18 on evaluation today patient appears to be doing quite well in regard to his left lower [place] ulcers. In fact of the areas that were noted initially he only has two regions still open. There is no evidence of active infection at this time. He still is not heard anything  from the company regarding lymphedema pumps as of yet. Again as previously seen vascular they have not recommended any surgical intervention. 12/03/2018 on evaluation today patient actually appears to be doing quite well with regard to his lower extremity ulcers. In fact most of the areas appear to be healed the one spot which does not seem to be completely healed I am unsure of whether or not this is really draining that much but nonetheless there does not appear to be any signs of infection or significant drainage at this point. There is no sign of fever, chills, nausea, vomiting, or diarrhea. Overall I am pleased with how things have progressed I think is very close to being able to transition to his home compression stockings. 12/10/2018 upon evaluation today patient appears to be doing quite well with regard to his left lower extremity. He has been tolerating the dressing changes without complication. Fortunately there is no signs of active infection at this time. He appears after thorough evaluation of his leg to only have 1 small area that remains open at this point everything else appears to be almost completely closed. He still have significant swelling of the left lower extremity. We had discussed discussing this with his primary care provider he is not able to see her in person they were at the [facility] and right now the [place] is not seeing patients on site. According to the patient anyway. Subsequently he did speak with her apparently and his primary care provider feels that he may likely have a DVT. With that being said she has not seen his leg she is just going off of his history. Nonetheless that is a concern that the patient now has as well and while I do not feel the DVT is likely we can definitely ensure that that is not the case I will go ahead and see about putting that order in today. Nonetheless otherwise I am in a recommend that we continue with the current wound care measures  including the compression therapy most likely. We just need to ensure that his leg is indeed free of any DVTs. 12/17/2018 on evaluation today patient actually appears to be completely healed today. He does have  2 very small areas of blistering although this is not anything too significant at this point which is good news. With that being said I am in agreement with the fact that I think he is completely healed at this point. He does want to get back into his compression stocking. The good news is we have gotten approval from insurance for his lymphedema pumps we received a letter since last saw him last week. The other good news is his study did come back and showed no evidence of a DVT. 12/20/2018 on evaluation today patient presents for follow-up concerning his ongoing issues with his left lower extremity. He was actually discharged last Friday and did fairly well until he states blisters opened this morning. He tells me he has been wearing his compression stocking although he has a hard time getting this on. There does not appear to be any signs of active infection at this time. No fevers, chills, nausea, vomiting, or diarrhea. 12/27/2018 on evaluation today patient appears to be doing very well with regard to his swelling of the left lower extremity the 4 layer compression wrap seems to have been beneficial for him. Fortunately there is no signs of active infection at this time. Patient has been tolerating the compression wrap without complication and his foot swelling in particular appears to be greatly improved. He does still have a wound on the lateral portion of his left leg I believe this is more of a blister that has now reopened. [name], [name] ([id number]) 01/03/2019 on evaluation today patient actually appears to be doing excellent in regard to his left lower extremity. He did receive his compression pumps and is actually use this 7 times since he was last here in the office. On top of  the compression wrap he is now roughly 3 cm better at the calf and 2 cm better at the ankle he also states that his foot seem to go an issue better without even having to use a shoe horn. Obviously I think this is all evidence that he is doing excellent in this regard. The other good news is he does not appear to have anything open today as far as wounds are concerned. 01/15/2019 on evaluation today patient appears to be doing more poorly yet again with regard to his left lower extremity. He has developed new wounds again after being discharged just recently. Unfortunately this continues to be the case that he will heal and then have subsequent new wounds. The last time I was hopeful that he may not end up coming back too quickly especially since he states he has been using his lymphedema pumps along with wearing his compression. Nonetheless he had a blister on the back of his leg that popped up on the left and this has opened up into an ulceration it is quite painful. 01/22/19 on evaluation today patient actually appears to be doing well with regard to his wound on the left lower extremity. He's been tolerating the dressing changes without complication including the compression wrap in the wound appears to be significantly smaller today which is great news. Overall very pleased in this regard. 01/29/2019 on evaluation today patient appears to be doing well with regard to his left posterior lower extremity ulcer. He has been tolerating the dressing changes without complication. This is not completely healed but is getting much closer. We did order a Farrow wrap 4000 for him he has received this and has it with him today although I am not sure  we are quite ready to start him on that as of yet. We are very close. 02/05/2019 on evaluation today patient actually appears to be doing quite well with regard to his left posterior lower extremity ulcer. He still has a very tiny opening remaining but the fortunate  thing is he seems to be healing quite nicely. He also did get his Farrow wrap which I am hoping will help with his edema control as well at home. Fortunately there is no evidence of active infection. 02/12/2019 patient and fortunately appears to be doing poorly in regard to his wounds of the left lower extremity. He was very close to healing therefore we attempted to use his Velcro compression wraps continuing with lymphedema pumps at home. Unfortunately that does not seem to have done very well for him. He tells me that he wore them all the time but again I am not sure why if that is the case that he is having such significant edema. He is still on his fluid pills as well. With that being said there is no obvious sign of infection although I do wonder about the possibility of infection at this time as well. 02/19/2019 unfortunately upon evaluation today patient appears to be doing more poorly with regard to his left lower extremity. He is not showing signs of significant improvement and I think the biggest issue here is that he does have an infection that appears to likely be Pseudomonas. That is based on the blue-green drainage that were noted at this time. Unfortunately the antibiotic that has been on is not going to take care of this at all. I think they will get a need to switch him to either Levaquin or Cipro and this was discussed with the patient. 02/26/2019 on evaluation today patient's lower extremity on the left appears to be doing significantly better as compared to last evaluation. Fortunately there is no signs of active infection at this time. He has been tolerating the compression wrap without complication in fact he made it the whole week at this point. He is showing signs of excellent improvement I am very happy in this regard. With that being said he is having some issues with infection we did review the results of his culture which I noted today. He did have a positive finding for  Enterobacter as well as Alcaligenes faecalis. Fortunately the Levaquin that I placed him on will work for both which is great news. There is no signs of systemic infection at this point. 10/30; left posterior leg wound in the setting of very significant edema and what looks like chronic venous inflammation. He has compression pumps but does not use them. We have been using 3 layer compression. Silver alginate to the wound as the primary dressing 03/18/2019 on evaluation today patient appears to be doing a little better compared to last time I saw him. He really has not been using his compression pumps he tells me that he is having too much discomfort. He has been keeping his wraps on however. He is only been taking his fluid pills every other day because he states they are not really helping and he has an appointment with his primary care provider at the [facility] tomorrow. Subsequently the wound itself on the left lower extremity does seem to be greatly improved compared to previous. 03/25/2019 on evaluation today patient appears to be doing better with regard to his wounds on the bilateral lower extremities. The left is doing excellent the right is also  doing better although both still do show some signs of open wounds noted at this point unfortunately. Fortunately there is no signs of active infection at this time. The patient also is not really having any significant pain which is good news. Unfortunately there was some confusion with the referral on vascular disease and as far as getting the patient scheduled there can be contacting him later today to do this fortunately we got this straightened out. [name], [name] ([id number]) 04/01/2019 on evaluation today patient appears to be doing no fevers, chills, nausea, vomiting, or diarrhea. Excellent at this time with regard to his lower extremities. There does not appear to be any open wound at this point which is good news. Fortunately is also no signs of  active infection at this time. Overall feel like the patient has done excellent with the compression the problem is every time we got him to this point and then subsequently go to using his own compression things just go right back to where they were. I am not sure how to address this we can try to get an appointment with vascular for 2 weeks now they have yet to call him. Obviously this has become frustrating for the patient as well. I think the issue has just been an honest error as far as scheduling is concerned but nonetheless still worn out the point where I am unsure of which direction we should take. Patient History Information obtained from Patient. Family History Cancer - Mother, Stroke - Father, No family history of Diabetes, Heart Disease, Hereditary Spherocytosis, Hypertension, Kidney Disease, Lung Disease, Seizures, Thyroid Problems, Tuberculosis. Social History Former smoker - quit 5 years ago, Marital Status - Divorced, Alcohol Use - Never, Drug Use - No History, Caffeine Use - Daily. Medical History Cardiovascular Patient has history of Hypertension, Peripheral Venous Disease Denies history of Angina, Arrhythmia, Congestive Heart Failure, Coronary Artery Disease, Deep Vein Thrombosis, Hypotension, Myocardial Infarction, Peripheral Arterial Disease, Phlebitis, Vasculitis Endocrine Patient has history of Type II Diabetes Integumentary (Skin) Denies history of History of Burn, History of pressure wounds Neurologic Patient has history of Neuropathy Denies history of Dementia, Quadriplegia, Paraplegia, Seizure Disorder Oncologic Patient has history of Received Chemotherapy Denies history of Received Radiation Medical And Surgical History Notes 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 Respiratory Denies complaints or symptoms  of Chronic or frequent coughs, Shortness of Breath. Cardiovascular Denies complaints or symptoms of Chest pain, LE edema. Psychiatric Denies complaints or symptoms of Anxiety, Claustrophobia. [place], [name] ([id number]) Objective Constitutional Well-nourished and well-hydrated in no acute distress. Vitals Time Taken: 3:15 PM, Height: 73 in, Weight: 300 lbs, BMI: 39.6, Temperature: 99.0 F, Pulse: 61 bpm, Respiratory Rate: 16 breaths/min, Blood Pressure: 135/55 mmHg. Respiratory normal breathing without difficulty. clear to auscultation bilaterally. Cardiovascular regular rate and rhythm with normal S1, S2. Psychiatric this patient is able to make decisions and demonstrates good insight into disease process. Alert and Oriented x 3. pleasant and cooperative. General Notes: Patient's wounds again have completely resolved and seem to be doing excellent as far as that is concerned based on what I am seeing today. Fortunately there is no evidence of active infection either which is also good news. Overall I still think he is going require compression wraps to keep the edema under control and prevent this from reopening immediately which is what happened in the past 2 times he is healed and  we transitioned into his home compression. Integumentary (Hair, Skin) Wound #8 status is Healed - Epithelialized. Original cause of wound was Blister. The wound is located on the Left,Posterior Lower Leg. The wound measures 0cm length x 0cm width x 0cm depth; 0cm^2 area and 0cm^3 volume. There is Fat Layer (Subcutaneous Tissue) Exposed exposed. There is no tunneling or undermining noted. There is a small amount of serous drainage noted. The wound margin is flat and intact. There is large (67-100%) red granulation within the wound bed. There is no necrotic tissue within the wound bed. Assessment Active Problems ICD-10 Type 2 diabetes mellitus with other skin ulcer Lymphedema, not elsewhere  classified Venous insufficiency (chronic) (peripheral) Non-pressure chronic ulcer of other part of left lower leg with fat layer exposed Essential (primary) hypertension Plan Follow-up Appointments: Return Appointment in 1 week. Edema Control: 4 Layer Compression System - Bilateral - Unna to anchor [name], [name] ([id number]) 1. At this point again the patient has no open wounds which is excellent news however I am going to go ahead and recommend that we continue with the compression wrap at this time since again I think that is the only thing that is really can keep him under good control as far as the edema is concerned. 2. We will still try to get an appointment with vascular we will call them again and see if they can go ahead and expedite this referral since again they were supposed to call him last Monday and is yet to hear from them after we settle things with while the appointment had not been taking care of previous. 3. We will also recommend the patient continue with elevation as well as using lymphedema pumps which I think are still good to be of utmost importance ongoing for him. Despite all this however he still has issues where this just seems to be reopening very quickly. We will see patient back for reevaluation in 1 week here in the clinic. If anything worsens or changes patient will contact our office for additional recommendations. Electronic Signature(s) Signed: 04/01/2019 4:28:08 PM By: Worthy Keeler PA-C Entered By: Worthy Keeler on 04/01/2019 16:28:08 [name] ([id number]) -------------------------------------------------------------------------------- ROS/PFSH Details Patient Name: [name] Date of Service: 04/01/2019 3:15 PM Medical Record Number: [id number] Patient Account Number: [id number] Date of Birth/Sex: 1951/01/01 (68 y.o. M) Treating RN: Harold Barban Primary Care Provider: SYSTEM, PCP Other Clinician: Referring Provider: Referral,  Self Treating Provider/Extender: STONE III, Baby Gieger Weeks in Treatment: 20 Information Obtained From Patient Constitutional Symptoms (General Health) Complaints and Symptoms: Negative for: Fatigue; Fever; Chills; Marked Weight Change Respiratory Complaints and Symptoms: Negative for: Chronic or frequent coughs; Shortness of Breath Cardiovascular Complaints and Symptoms: Negative for: Chest pain; LE edema Medical History: Positive for: Hypertension; Peripheral Venous Disease Negative for: Angina; Arrhythmia; Congestive Heart Failure; Coronary Artery Disease; Deep Vein Thrombosis; Hypotension; Myocardial Infarction; Peripheral Arterial Disease; Phlebitis; Vasculitis Psychiatric Complaints and Symptoms: Negative for: Anxiety; Claustrophobia Endocrine Medical History: Positive for: Type II Diabetes Treated with: Oral agents Integumentary (Skin) Medical History: Negative for: History of Burn; History of pressure wounds Neurologic Medical History: Positive for: Neuropathy Negative for: Dementia; Quadriplegia; Paraplegia; Seizure Disorder Oncologic Medical History: Positive for: Received Chemotherapy Negative for: Received Radiation [name], [name] ([id number]) Past Medical History Notes: 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 Immunizations Pneumococcal Vaccine: Received Pneumococcal Vaccination: Yes Immunization Notes: up to date Implantable Devices None Family and Social History Cancer: Yes - Mother; Diabetes: No; Heart Disease: No; Hereditary Spherocytosis: No; Hypertension: No; Kidney  Disease: No; Lung Disease: No; Seizures: No; Stroke: Yes - Father; Thyroid Problems: No; Tuberculosis: No; Former smoker - quit 5 years ago; Marital Status - Divorced; Alcohol Use: Never; Drug Use: No History; Caffeine Use: Daily; Financial Concerns: No; Food, Clothing or Shelter Needs: No; Support System Lacking: No; Transportation Concerns: No Physician Affirmation I have reviewed and agree with  the above information. Electronic Signature(s) Signed: 04/01/2019 5:37:56 PM By: Harold Barban Entered By: Worthy Keeler on 04/01/2019 16:15:32 [name] ([id number]) -------------------------------------------------------------------------------- SuperBill Details Patient Name: [name] Date of Service: 04/01/2019 Medical Record Number: [id number] Patient Account Number: [id number] Date of Birth/Sex: 06/08/1950 (68 y.o. M) Treating RN: Harold Barban Primary Care Provider: SYSTEM, PCP Other Clinician: Referring Provider: Referral, Self Treating Provider/Extender: STONE III, Trumaine Wimer Weeks in Treatment: 20 Diagnosis Coding ICD-10 Codes Code Description E11.622 Type 2 diabetes mellitus with other skin ulcer I89.0 Lymphedema, not elsewhere classified I87.2 Venous insufficiency (chronic) (peripheral) L97.822 Non-pressure chronic ulcer of other part of left lower leg with fat layer exposed I10 Essential (primary) hypertension Facility Procedures CPT4 Code: [id number] Description: [id number] - WOUND CARE VISIT-LEV 2 EST PT Modifier: Quantity: 1 Physician Procedures CPT4 Code Description: [id number] - WC PHYS LEVEL 4 - EST PT ICD-10 Diagnosis Description E11.622 Type 2 diabetes mellitus with other skin ulcer I89.0 Lymphedema, not elsewhere classified I87.2 Venous insufficiency (chronic) (peripheral) L97.822  Non-pressure chronic ulcer of other part of left lower leg wit Modifier: h fat layer expos Quantity: 1 ed Electronic Signature(s) Signed: 04/01/2019 4:28:27 PM By: Worthy Keeler PA-C Entered By: Worthy Keeler on 04/01/2019 [id number]

## 2019-04-08 ENCOUNTER — Encounter: Payer: PPO | Admitting: Physician Assistant

## 2019-04-08 ENCOUNTER — Other Ambulatory Visit: Payer: Self-pay

## 2019-04-08 DIAGNOSIS — E11622 Type 2 diabetes mellitus with other skin ulcer: Secondary | ICD-10-CM | POA: Diagnosis not present

## 2019-04-08 DIAGNOSIS — L97829 Non-pressure chronic ulcer of other part of left lower leg with unspecified severity: Secondary | ICD-10-CM | POA: Diagnosis not present

## 2019-04-08 DIAGNOSIS — R600 Localized edema: Secondary | ICD-10-CM | POA: Diagnosis not present

## 2019-04-09 ENCOUNTER — Other Ambulatory Visit (INDEPENDENT_AMBULATORY_CARE_PROVIDER_SITE_OTHER): Payer: Self-pay | Admitting: Vascular Surgery

## 2019-04-09 DIAGNOSIS — L97929 Non-pressure chronic ulcer of unspecified part of left lower leg with unspecified severity: Secondary | ICD-10-CM

## 2019-04-10 NOTE — Progress Notes (Signed)
KAAN, CASINI ([id number]) Visit Report for 04/08/2019 Chief Complaint Document Details Patient Name: [name], [name] Date of Service: 04/08/2019 9:00 AM Medical Record Number: [id number] Patient Account Number: [id number] Date of Birth/Sex: 1950/09/07 (68 y.o. M) Treating RN: Harold Barban Primary Care Provider: SYSTEM, PCP Other Clinician: Referring Provider: Referral, Self Treating Provider/Extender: Melburn Hake, Anaja Monts Weeks in Treatment: 21 Information Obtained from: Patient Chief Complaint Left LE ulcers Electronic Signature(s) Signed: 04/09/2019 5:47:36 PM By: Worthy Keeler PA-C Entered By: Worthy Keeler on 04/08/2019 09:12:51 [name] ([id number]) -------------------------------------------------------------------------------- HPI Details Patient Name: [name] Date of Service: 04/08/2019 9:00 AM Medical Record Number: [id number] Patient Account Number: [id number] Date of Birth/Sex: [birth year] (68 y.o. M) Treating RN: Harold Barban Primary Care Provider: SYSTEM, PCP Other Clinician: Referring Provider: Referral, Self Treating Provider/Extender: STONE III, Helena Sardo Weeks in Treatment: 21 History of Present Illness HPI Description: 68 year old male who presented to the ER with bilateral lower extremity blisters which had started last week. he has a past medical history of leukemia, diabetes mellitus, hypertension, edema of both lower extremities, his recurrent skin infections, peripheral vascular disease, coronary artery disease, congestive heart failure and peripheral neuropathy. in the ER he was given Rocephin and put on Silvadene cream. he was put on oral doxycycline and was asked to follow-up with the [facility]. His last hemoglobin A1c was 6.6 in December and he checks his blood sugar once a week. He does not have any physicians outside the [place] system. He does not recall any vascular duplex studies done either for arterial or venous disease but was told to wear  compression stockings which he does not use 05/30/2016 -- we have not yet received any of his notes from the [facility] hospital system and his arterial and venous duplex studies are scheduled here in [place] around mid February. We are unable to have his insurance accepted by home health agencies and hence he is getting dressings only once a week. 06/06/16 -- -- I received a call from the patient's PCP at the [facility] at [facility] and spoke to Dr. Garvin Fila, phone number [phone] and fax number [phone]. She confirmed that no vascular testing was done over the last 5 years and she would be happy to do them if the patient did want them to be done at the [place] and we could fax him a request. Readmission: 68 year old male seen by as in February of this year and was referred to vein and vascular for studies and opinion from the vascular surgeons. The patient returns today with a fresh problem having had blisters on his left lower extremity which have been there for about 5 days and he clearly states that he has been wearing his compression stockings as advised though he could not read the moderate compression and has been wearing light compression. Review of his electronic medical records note that he had lower extremity arterial duplex examination done on 06/23/2016 which showed no hemodynamically significant stenosis in the bilateral lower extremity arterial system. He also had a lower extremity venous reflux examination done on 07/07/2016 and it was noted that he had venous incompetence in the right great saphenous vein and bilateral common femoral veins. Patient was seen by Dr. Tamala Julian on the same day and for some reason his notes do not reflect the venous studies or the arterial studies and he recommended patient do a venous duplex ultrasound to look for reflux and return to see him.he would also consider a lymph pump if required. The patient was told that  his workup was normal and hence the  patient canceled his follow-up appointment. 02/03/17 on evaluation today patient left medial lower extremity blister appears to be doing about the same. It is still continuing to drain and there's still the blistered skin covering the wound bed which is making it difficult for the alternate to do its job. Fortunately there is no evidence of cellulitis. No fevers chills noted. Patient states in general he is not having any significant discomfort. Patient's lower extremity arterial duplex exam revealed that patient was hemodynamically stable with no evidence of stenosis in regard to the bilateral lower extremities. The lower extremity venous reflux exam revealed the patient had venous incontinence noted in the right greater saphenous and bilateral common femoral vein. There is no evidence of deep or superficial vein thrombosis in the bilateral lower extremities. Readmission: [name], Mooney ([id number]) 11/12/18 Patient presents for evaluation our clinic today concerning issues that he is having with his left lower extremity. He tells me that a couple weeks ago he began developing blisters on the left lower extremity along with increased swelling. He typically wears his compression stockings on a regular basis is previously been evaluated both here as well is with vascular surgery they would recommend lymphedema pumps but unfortunately that somehow fell through and he never heard anything back from that. Nonetheless I think lymphedema pumps would be beneficial for this patient. He does have a history of hypertension and diabetes. Obviously the chronic venous stasis and lymphedema as well. At this point the blisters have been given in more trouble he states sometimes when the blisters openings able to clean it down with alcohol and it will dry out and do well. Unfortunately that has not been the case this time. He is having some discomfort although this mean these with cleaning the areas he doesn't have  discomfort just on a regular basis. He has not been able to wear his compression stockings since the blisters arose due to the fact that of course it will drain into the socks causing additional issues and he didn't have any way to wrap this otherwise. He has increased to taking his Lasix every day instead of every other day. He sees his primary care provider later this month as well. No fevers, chills, nausea, or vomiting noted at this time. 11/19/18-Patient returns at 1 week, per intake RN the amount of seepage into the compression wraps was definitely improved, overall all the wounds are measuring smaller but continuing silver alginate to the wounds as primary dressing 11/26/18 on evaluation today patient appears to be doing quite well in regard to his left lower [place] ulcers. In fact of the areas that were noted initially he only has two regions still open. There is no evidence of active infection at this time. He still is not heard anything from the company regarding lymphedema pumps as of yet. Again as previously seen vascular they have not recommended any surgical intervention. 12/03/2018 on evaluation today patient actually appears to be doing quite well with regard to his lower extremity ulcers. In fact most of the areas appear to be healed the one spot which does not seem to be completely healed I am unsure of whether or not this is really draining that much but nonetheless there does not appear to be any signs of infection or significant drainage at this point. There is no sign of fever, chills, nausea, vomiting, or diarrhea. Overall I am pleased with how things have progressed I think is very close  to being able to transition to his home compression stockings. 12/10/2018 upon evaluation today patient appears to be doing quite well with regard to his left lower extremity. He has been tolerating the dressing changes without complication. Fortunately there is no signs of active infection at  this time. He appears after thorough evaluation of his leg to only have 1 small area that remains open at this point everything else appears to be almost completely closed. He still have significant swelling of the left lower extremity. We had discussed discussing this with his primary care provider he is not able to see her in person they were at the [facility] and right now the [place] is not seeing patients on site. According to the patient anyway. Subsequently he did speak with her apparently and his primary care provider feels that he may likely have a DVT. With that being said she has not seen his leg she is just going off of his history. Nonetheless that is a concern that the patient now has as well and while I do not feel the DVT is likely we can definitely ensure that that is not the case I will go ahead and see about putting that order in today. Nonetheless otherwise I am in a recommend that we continue with the current wound care measures including the compression therapy most likely. We just need to ensure that his leg is indeed free of any DVTs. 12/17/2018 on evaluation today patient actually appears to be completely healed today. He does have 2 very small areas of blistering although this is not anything too significant at this point which is good news. With that being said I am in agreement with the fact that I think he is completely healed at this point. He does want to get back into his compression stocking. The good news is we have gotten approval from insurance for his lymphedema pumps we received a letter since last saw him last week. The other good news is his study did come back and showed no evidence of a DVT. 12/20/2018 on evaluation today patient presents for follow-up concerning his ongoing issues with his left lower extremity. He was actually discharged last Friday and did fairly well until he states blisters opened this morning. He tells me he has been wearing his compression  stocking although he has a hard time getting this on. There does not appear to be any signs of active infection at this time. No fevers, chills, nausea, vomiting, or diarrhea. 12/27/2018 on evaluation today patient appears to be doing very well with regard to his swelling of the left lower extremity the 4 layer compression wrap seems to have been beneficial for him. Fortunately there is no signs of active infection at this time. Patient has been tolerating the compression wrap without complication and his foot swelling in particular appears to be greatly improved. He does still have a wound on the lateral portion of his left leg I believe this is more of a blister that has now reopened. 01/03/2019 on evaluation today patient actually appears to be doing excellent in regard to his left lower extremity. He did receive his compression pumps and is actually use this 7 times since he was last here in the office. On top of the compression wrap he is now roughly 3 cm better at the calf and 2 cm better at the ankle he also states that his foot seem to go an issue better without even having to use a shoe horn.  Obviously I think this is all evidence that he is doing excellent in this regard. The other good news is he does not appear to have anything open today as far as wounds are concerned. JONOTHAN, MELIAN ([id number]) 01/15/2019 on evaluation today patient appears to be doing more poorly yet again with regard to his left lower extremity. He has developed new wounds again after being discharged just recently. Unfortunately this continues to be the case that he will heal and then have subsequent new wounds. The last time I was hopeful that he may not end up coming back too quickly especially since he states he has been using his lymphedema pumps along with wearing his compression. Nonetheless he had a blister on the back of his leg that popped up on the left and this has opened up into an ulceration it is quite  painful. 01/22/19 on evaluation today patient actually appears to be doing well with regard to his wound on the left lower extremity. He's been tolerating the dressing changes without complication including the compression wrap in the wound appears to be significantly smaller today which is great news. Overall very pleased in this regard. 01/29/2019 on evaluation today patient appears to be doing well with regard to his left posterior lower extremity ulcer. He has been tolerating the dressing changes without complication. This is not completely healed but is getting much closer. We did order a Farrow wrap 4000 for him he has received this and has it with him today although I am not sure we are quite ready to start him on that as of yet. We are very close. 02/05/2019 on evaluation today patient actually appears to be doing quite well with regard to his left posterior lower extremity ulcer. He still has a very tiny opening remaining but the fortunate thing is he seems to be healing quite nicely. He also did get his Farrow wrap which I am hoping will help with his edema control as well at home. Fortunately there is no evidence of active infection. 02/12/2019 patient and fortunately appears to be doing poorly in regard to his wounds of the left lower extremity. He was very close to healing therefore we attempted to use his Velcro compression wraps continuing with lymphedema pumps at home. Unfortunately that does not seem to have done very well for him. He tells me that he wore them all the time but again I am not sure why if that is the case that he is having such significant edema. He is still on his fluid pills as well. With that being said there is no obvious sign of infection although I do wonder about the possibility of infection at this time as well. 02/19/2019 unfortunately upon evaluation today patient appears to be doing more poorly with regard to his left lower extremity. He is not showing signs  of significant improvement and I think the biggest issue here is that he does have an infection that appears to likely be Pseudomonas. That is based on the blue-green drainage that were noted at this time. Unfortunately the antibiotic that has been on is not going to take care of this at all. I think they will get a need to switch him to either Levaquin or Cipro and this was discussed with the patient. 02/26/2019 on evaluation today patient's lower extremity on the left appears to be doing significantly better as compared to last evaluation. Fortunately there is no signs of active infection at this time. He has been tolerating the  compression wrap without complication in fact he made it the whole week at this point. He is showing signs of excellent improvement I am very happy in this regard. With that being said he is having some issues with infection we did review the results of his culture which I noted today. He did have a positive finding for Enterobacter as well as Alcaligenes faecalis. Fortunately the Levaquin that I placed him on will work for both which is great news. There is no signs of systemic infection at this point. 10/30; left posterior leg wound in the setting of very significant edema and what looks like chronic venous inflammation. He has compression pumps but does not use them. We have been using 3 layer compression. Silver alginate to the wound as the primary dressing 03/18/2019 on evaluation today patient appears to be doing a little better compared to last time I saw him. He really has not been using his compression pumps he tells me that he is having too much discomfort. He has been keeping his wraps on however. He is only been taking his fluid pills every other day because he states they are not really helping and he has an appointment with his primary care provider at the [facility] tomorrow. Subsequently the wound itself on the left lower extremity does seem to be greatly improved  compared to previous. 03/25/2019 on evaluation today patient appears to be doing better with regard to his wounds on the bilateral lower extremities. The left is doing excellent the right is also doing better although both still do show some signs of open wounds noted at this point unfortunately. Fortunately there is no signs of active infection at this time. The patient also is not really having any significant pain which is good news. Unfortunately there was some confusion with the referral on vascular disease and as far as getting the patient scheduled there can be contacting him later today to do this fortunately we got this straightened out. 04/01/2019 on evaluation today patient appears to be doing no fevers, chills, nausea, vomiting, or diarrhea. Excellent at this time with regard to his lower extremities. There does not appear to be any open wound at this point which is good news. Fortunately is also no signs of active infection at this time. Overall feel like the patient has done excellent with the compression the problem is every time we got him to this point and then subsequently go to using his own compression [place], [name] ([id number]) things just go right back to where they were. I am not sure how to address this we can try to get an appointment with vascular for 2 weeks now they have yet to call him. Obviously this has become frustrating for the patient as well. I think the issue has just been an honest error as far as scheduling is concerned but nonetheless still worn out the point where I am unsure of which direction we should take. 04/08/2019 on evaluation today patient actually appears to be doing well with regard to his lower extremities. There are no open wounds at this time and things seem to be managing quite nicely as far as the overall edema control is concerned. With that being said he does have his compression socks today for [place] to go ahead and reinitiate therapy in that  manner at this point. He is going to be going for shoes to be measured on Wednesday and then coincidentally he will also be seeing vascular on Thursday. Overall I think  this is good news and again I am hopeful that they will be able to do something for him to help prevent ongoing issues with edema control as well. No fevers, chills, nausea, vomiting, or diarrhea. Electronic Signature(s) Signed: 04/09/2019 5:47:36 PM By: Worthy Keeler PA-C Entered By: Worthy Keeler on 04/08/2019 09:22:33 [name] ([id number]) -------------------------------------------------------------------------------- Physical Exam Details Patient Name: [name] Date of Service: 04/08/2019 9:00 AM Medical Record Number: [id number] Patient Account Number: [id number] Date of Birth/Sex: 27-Mar-1951 (68 y.o. M) Treating RN: Harold Barban Primary Care Provider: SYSTEM, PCP Other Clinician: Referring Provider: Referral, Self Treating Provider/Extender: STONE III, Albertina Leise Weeks in Treatment: 21 Constitutional Well-nourished and well-hydrated in no acute distress. Respiratory normal breathing without difficulty. clear to auscultation bilaterally. Cardiovascular regular rate and rhythm with normal S1, S2. Psychiatric this patient is able to make decisions and demonstrates good insight into disease process. Alert and Oriented x 3. pleasant and cooperative. Notes Patient's wound bed currently showed signs of good epithelization at this point. Obviously this has been good news as far as wounds are concerned although he still has an issue with edema and again in the past every time we have transition to his own compression he has had issues with edema control. Nonetheless at this point I think that he is under good control I am hoping get him into his compression socks will be beneficial for him and then subsequently he will be seeing vascular on Thursday to see what they think they can do to potentially help him as  well. If there is really nothing vascular can do he continues to have issues ongoing with this recurrence and I think lymphedema clinic would be his next best option. Electronic Signature(s) Signed: 04/09/2019 5:47:36 PM By: Worthy Keeler PA-C Entered By: Worthy Keeler on 04/08/2019 09:23:48 [name] ([id number]) -------------------------------------------------------------------------------- Physician Orders Details Patient Name: [name] Date of Service: 04/08/2019 9:00 AM Medical Record Number: [id number] Patient Account Number: [id number] Date of Birth/Sex: 1951/01/06 (68 y.o. M) Treating RN: Harold Barban Primary Care Provider: SYSTEM, PCP Other Clinician: Referring Provider: Referral, Self Treating Provider/Extender: STONE III, Chelby Salata Weeks in Treatment: 21 Verbal / Phone Orders: No Diagnosis Coding ICD-10 Coding Code Description E11.622 Type 2 diabetes mellitus with other skin ulcer I89.0 Lymphedema, not elsewhere classified I87.2 Venous insufficiency (chronic) (peripheral) L97.822 Non-pressure chronic ulcer of other part of left lower leg with fat layer exposed I10 Essential (primary) hypertension Discharge From [facility] Services o Discharge from [place] wearing own compressions hose, call with any questions. Thank you Electronic Signature(s) Signed: 04/09/2019 5:47:36 PM By: Worthy Keeler PA-C Signed: 04/10/2019 4:10:47 PM By: Harold Barban Entered By: Harold Barban on 04/08/2019 09:19:39 [name] ([id number]) -------------------------------------------------------------------------------- Problem List Details Patient Name: [name] Date of Service: 04/08/2019 9:00 AM Medical Record Number: [id number] Patient Account Number: [id number] Date of Birth/Sex: January 03, 1951 (68 y.o. M) Treating RN: Harold Barban Primary Care Provider: SYSTEM, PCP Other Clinician: Referring Provider: Referral, Self Treating Provider/Extender: Melburn Hake, Demaryius Imran Weeks in Treatment: 21 Active Problems ICD-10 Evaluated Encounter Code Description Active Date Today Diagnosis E11.622 Type 2 diabetes mellitus with other skin ulcer 11/12/2018 No Yes I89.0 Lymphedema, not elsewhere classified 11/12/2018 No Yes I87.2 Venous insufficiency (chronic) (peripheral) 11/12/2018 No Yes L97.822 Non-pressure chronic ulcer of other part of left lower leg with 11/12/2018 No Yes fat layer exposed I10 Essential (primary) hypertension 11/12/2018 No Yes Inactive Problems Resolved Problems Electronic Signature(s) Signed: 04/09/2019 5:47:36 PM By: Worthy Keeler PA-C  Entered By: Worthy Keeler on 04/08/2019 09:12:44 [name] ([id number]) -------------------------------------------------------------------------------- Progress Note Details Patient Name: KAHEEM, VENHAUS Date of Service: 04/08/2019 9:00 AM Medical Record Number: [id number] Patient Account Number: [id number] Date of Birth/Sex: [birth year] (68 y.o. M) Treating RN: Harold Barban Primary Care Provider: SYSTEM, PCP Other Clinician: Referring Provider: Referral, Self Treating Provider/Extender: STONE III, Lain Tetterton Weeks in Treatment: 21 Subjective Chief Complaint Information obtained from Patient Left LE ulcers History of Present Illness (HPI) 68 year old male who presented to the ER with bilateral lower extremity blisters which had started last week. he has a past medical history of leukemia, diabetes mellitus, hypertension, edema of both lower extremities, his recurrent skin infections, peripheral vascular disease, coronary artery disease, congestive heart failure and peripheral neuropathy. in the ER he was given Rocephin and put on Silvadene cream. he was put on oral doxycycline and was asked to follow-up with the [facility]. His last hemoglobin A1c was 6.6 in December and he checks his blood sugar once a week. He does not have any physicians outside the [place] system. He does not recall any vascular  duplex studies done either for arterial or venous disease but was told to wear compression stockings which he does not use 05/30/2016 -- we have not yet received any of his notes from the [facility] hospital system and his arterial and venous duplex studies are scheduled here in [place] around mid February. We are unable to have his insurance accepted by home health agencies and hence he is getting dressings only once a week. 06/06/16 -- -- I received a call from the patient's PCP at the [facility] and spoke to Dr. Garvin Fila, phone number [phone] and fax number [phone]. She confirmed that no vascular testing was done over the last 5 years and she would be happy to do them if the patient did want them to be done at the [place] and we could fax him a request. Readmission: 68 year old male seen by as in February of this year and was referred to vein and vascular for studies and opinion from the vascular surgeons. The patient returns today with a fresh problem having had blisters on his left lower extremity which have been there for about 5 days and he clearly states that he has been wearing his compression stockings as advised though he could not read the moderate compression and has been wearing light compression. Review of his electronic medical records note that he had lower extremity arterial duplex examination done on 06/23/2016 which showed no hemodynamically significant stenosis in the bilateral lower extremity arterial system. He also had a lower extremity venous reflux examination done on 07/07/2016 and it was noted that he had venous incompetence in the right great saphenous vein and bilateral common femoral veins. Patient was seen by Dr. Tamala Julian on the same day and for some reason his notes do not reflect the venous studies or the arterial studies and he recommended patient do a venous duplex ultrasound to look for reflux and return to see him.he would also consider a lymph pump if  required. The patient was told that his workup was normal and hence the patient canceled his follow-up appointment. 02/03/17 on evaluation today patient left medial lower extremity blister appears to be doing about the same. It is still continuing to drain and there's still the blistered skin covering the wound bed which is making it difficult for the alternate to do its job. Fortunately there is no evidence of cellulitis. No fevers chills noted. Patient  states in general he is not having any significant discomfort. Patient's lower extremity arterial duplex exam revealed that patient was hemodynamically stable with no evidence of stenosis in regard to the bilateral lower extremities. The lower extremity venous reflux exam revealed the patient had venous incontinence noted in the right greater saphenous [place], [name] ([id number]) and bilateral common femoral vein. There is no evidence of deep or superficial vein thrombosis in the bilateral lower extremities. Readmission: 11/12/18 Patient presents for evaluation our clinic today concerning issues that he is having with his left lower extremity. He tells me that a couple weeks ago he began developing blisters on the left lower extremity along with increased swelling. He typically wears his compression stockings on a regular basis is previously been evaluated both here as well is with vascular surgery they would recommend lymphedema pumps but unfortunately that somehow fell through and he never heard anything back from that. Nonetheless I think lymphedema pumps would be beneficial for this patient. He does have a history of hypertension and diabetes. Obviously the chronic venous stasis and lymphedema as well. At this point the blisters have been given in more trouble he states sometimes when the blisters openings able to clean it down with alcohol and it will dry out and do well. Unfortunately that has not been the case this time. He is having some  discomfort although this mean these with cleaning the areas he doesn't have discomfort just on a regular basis. He has not been able to wear his compression stockings since the blisters arose due to the fact that of course it will drain into the socks causing additional issues and he didn't have any way to wrap this otherwise. He has increased to taking his Lasix every day instead of every other day. He sees his primary care provider later this month as well. No fevers, chills, nausea, or vomiting noted at this time. 11/19/18-Patient returns at 1 week, per intake RN the amount of seepage into the compression wraps was definitely improved, overall all the wounds are measuring smaller but continuing silver alginate to the wounds as primary dressing 11/26/18 on evaluation today patient appears to be doing quite well in regard to his left lower [place] ulcers. In fact of the areas that were noted initially he only has two regions still open. There is no evidence of active infection at this time. He still is not heard anything from the company regarding lymphedema pumps as of yet. Again as previously seen vascular they have not recommended any surgical intervention. 12/03/2018 on evaluation today patient actually appears to be doing quite well with regard to his lower extremity ulcers. In fact most of the areas appear to be healed the one spot which does not seem to be completely healed I am unsure of whether or not this is really draining that much but nonetheless there does not appear to be any signs of infection or significant drainage at this point. There is no sign of fever, chills, nausea, vomiting, or diarrhea. Overall I am pleased with how things have progressed I think is very close to being able to transition to his home compression stockings. 12/10/2018 upon evaluation today patient appears to be doing quite well with regard to his left lower extremity. He has been tolerating the dressing changes  without complication. Fortunately there is no signs of active infection at this time. He appears after thorough evaluation of his leg to only have 1 small area that remains open at this point everything  else appears to be almost completely closed. He still have significant swelling of the left lower extremity. We had discussed discussing this with his primary care provider he is not able to see her in person they were at the [facility] and right now the [place] is not seeing patients on site. According to the patient anyway. Subsequently he did speak with her apparently and his primary care provider feels that he may likely have a DVT. With that being said she has not seen his leg she is just going off of his history. Nonetheless that is a concern that the patient now has as well and while I do not feel the DVT is likely we can definitely ensure that that is not the case I will go ahead and see about putting that order in today. Nonetheless otherwise I am in a recommend that we continue with the current wound care measures including the compression therapy most likely. We just need to ensure that his leg is indeed free of any DVTs. 12/17/2018 on evaluation today patient actually appears to be completely healed today. He does have 2 very small areas of blistering although this is not anything too significant at this point which is good news. With that being said I am in agreement with the fact that I think he is completely healed at this point. He does want to get back into his compression stocking. The good news is we have gotten approval from insurance for his lymphedema pumps we received a letter since last saw him last week. The other good news is his study did come back and showed no evidence of a DVT. 12/20/2018 on evaluation today patient presents for follow-up concerning his ongoing issues with his left lower extremity. He was actually discharged last Friday and did fairly well until he states  blisters opened this morning. He tells me he has been wearing his compression stocking although he has a hard time getting this on. There does not appear to be any signs of active infection at this time. No fevers, chills, nausea, vomiting, or diarrhea. 12/27/2018 on evaluation today patient appears to be doing very well with regard to his swelling of the left lower extremity the 4 layer compression wrap seems to have been beneficial for him. Fortunately there is no signs of active infection at this time. Patient has been tolerating the compression wrap without complication and his foot swelling in particular appears to be greatly improved. He does still have a wound on the lateral portion of his left leg I believe this is more of a blister that has now reopened. MONSERRATE, FERRELLI ([id number]) 01/03/2019 on evaluation today patient actually appears to be doing excellent in regard to his left lower extremity. He did receive his compression pumps and is actually use this 7 times since he was last here in the office. On top of the compression wrap he is now roughly 3 cm better at the calf and 2 cm better at the ankle he also states that his foot seem to go an issue better without even having to use a shoe horn. Obviously I think this is all evidence that he is doing excellent in this regard. The other good news is he does not appear to have anything open today as far as wounds are concerned. 01/15/2019 on evaluation today patient appears to be doing more poorly yet again with regard to his left lower extremity. He has developed new wounds again after being discharged just recently. Unfortunately  this continues to be the case that he will heal and then have subsequent new wounds. The last time I was hopeful that he may not end up coming back too quickly especially since he states he has been using his lymphedema pumps along with wearing his compression. Nonetheless he had a blister on the back of his leg that  popped up on the left and this has opened up into an ulceration it is quite painful. 01/22/19 on evaluation today patient actually appears to be doing well with regard to his wound on the left lower extremity. He's been tolerating the dressing changes without complication including the compression wrap in the wound appears to be significantly smaller today which is great news. Overall very pleased in this regard. 01/29/2019 on evaluation today patient appears to be doing well with regard to his left posterior lower extremity ulcer. He has been tolerating the dressing changes without complication. This is not completely healed but is getting much closer. We did order a Farrow wrap 4000 for him he has received this and has it with him today although I am not sure we are quite ready to start him on that as of yet. We are very close. 02/05/2019 on evaluation today patient actually appears to be doing quite well with regard to his left posterior lower extremity ulcer. He still has a very tiny opening remaining but the fortunate thing is he seems to be healing quite nicely. He also did get his Farrow wrap which I am hoping will help with his edema control as well at home. Fortunately there is no evidence of active infection. 02/12/2019 patient and fortunately appears to be doing poorly in regard to his wounds of the left lower extremity. He was very close to healing therefore we attempted to use his Velcro compression wraps continuing with lymphedema pumps at home. Unfortunately that does not seem to have done very well for him. He tells me that he wore them all the time but again I am not sure why if that is the case that he is having such significant edema. He is still on his fluid pills as well. With that being said there is no obvious sign of infection although I do wonder about the possibility of infection at this time as well. 02/19/2019 unfortunately upon evaluation today patient appears to be doing  more poorly with regard to his left lower extremity. He is not showing signs of significant improvement and I think the biggest issue here is that he does have an infection that appears to likely be Pseudomonas. That is based on the blue-green drainage that were noted at this time. Unfortunately the antibiotic that has been on is not going to take care of this at all. I think they will get a need to switch him to either Levaquin or Cipro and this was discussed with the patient. 02/26/2019 on evaluation today patient's lower extremity on the left appears to be doing significantly better as compared to last evaluation. Fortunately there is no signs of active infection at this time. He has been tolerating the compression wrap without complication in fact he made it the whole week at this point. He is showing signs of excellent improvement I am very happy in this regard. With that being said he is having some issues with infection we did review the results of his culture which I noted today. He did have a positive finding for Enterobacter as well as Alcaligenes faecalis. Fortunately the Levaquin that I  placed him on will work for both which is great news. There is no signs of systemic infection at this point. 10/30; left posterior leg wound in the setting of very significant edema and what looks like chronic venous inflammation. He has compression pumps but does not use them. We have been using 3 layer compression. Silver alginate to the wound as the primary dressing 03/18/2019 on evaluation today patient appears to be doing a little better compared to last time I saw him. He really has not been using his compression pumps he tells me that he is having too much discomfort. He has been keeping his wraps on however. He is only been taking his fluid pills every other day because he states they are not really helping and he has an appointment with his primary care provider at the [facility] tomorrow. Subsequently  the wound itself on the left lower extremity does seem to be greatly improved compared to previous. 03/25/2019 on evaluation today patient appears to be doing better with regard to his wounds on the bilateral lower extremities. The left is doing excellent the right is also doing better although both still do show some signs of open wounds noted at this point unfortunately. Fortunately there is no signs of active infection at this time. The patient also is not really having any significant pain which is good news. Unfortunately there was some confusion with the referral on vascular disease and as far as getting the patient scheduled there can be contacting him later today to do this fortunately we got this straightened out. JASPER, BERRELLEZA ([id number]) 04/01/2019 on evaluation today patient appears to be doing no fevers, chills, nausea, vomiting, or diarrhea. Excellent at this time with regard to his lower extremities. There does not appear to be any open wound at this point which is good news. Fortunately is also no signs of active infection at this time. Overall feel like the patient has done excellent with the compression the problem is every time we got him to this point and then subsequently go to using his own compression things just go right back to where they were. I am not sure how to address this we can try to get an appointment with vascular for 2 weeks now they have yet to call him. Obviously this has become frustrating for the patient as well. I think the issue has just been an honest error as far as scheduling is concerned but nonetheless still worn out the point where I am unsure of which direction we should take. 04/08/2019 on evaluation today patient actually appears to be doing well with regard to his lower extremities. There are no open wounds at this time and things seem to be managing quite nicely as far as the overall edema control is concerned. With that being said he does have  his compression socks today for [place] to go ahead and reinitiate therapy in that manner at this point. He is going to be going for shoes to be measured on Wednesday and then coincidentally he will also be seeing vascular on Thursday. Overall I think this is good news and again I am hopeful that they will be able to do something for him to help prevent ongoing issues with edema control as well. No fevers, chills, nausea, vomiting, or diarrhea. Patient History Information obtained from Patient. Family History Cancer - Mother, Stroke - Father, No family history of Diabetes, Heart Disease, Hereditary Spherocytosis, Hypertension, Kidney Disease, Lung Disease, Seizures, Thyroid Problems, Tuberculosis. Social History  Former smoker - quit 5 years ago, Marital Status - Divorced, Alcohol Use - Never, Drug Use - No History, Caffeine Use - Daily. Medical History Cardiovascular Patient has history of Hypertension, Peripheral Venous Disease Denies history of Angina, Arrhythmia, Congestive Heart Failure, Coronary Artery Disease, Deep Vein Thrombosis, Hypotension, Myocardial Infarction, Peripheral Arterial Disease, Phlebitis, Vasculitis Endocrine Patient has history of Type II Diabetes Integumentary (Skin) Denies history of History of Burn, History of pressure wounds Neurologic Patient has history of Neuropathy Denies history of Dementia, Quadriplegia, Paraplegia, Seizure Disorder Oncologic Patient has history of Received Chemotherapy Denies history of Received Radiation Medical And Surgical History Notes 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 Respiratory Denies complaints or symptoms of Chronic or frequent coughs, Shortness of Breath. Cardiovascular Denies complaints or symptoms of Chest pain, LE edema. Psychiatric Denies complaints or symptoms of  Anxiety, Claustrophobia. [place], [name] ([id number]) Objective Constitutional Well-nourished and well-hydrated in no acute distress. Vitals Time Taken: 8:55 AM, Height: 73 in, Weight: 300 lbs, BMI: 39.6, Temperature: 98.6 F, Pulse: 55 bpm, Respiratory Rate: 16 breaths/min, Blood Pressure: 160/57 mmHg. Respiratory normal breathing without difficulty. clear to auscultation bilaterally. Cardiovascular regular rate and rhythm with normal S1, S2. Psychiatric this patient is able to make decisions and demonstrates good insight into disease process. Alert and Oriented x 3. pleasant and cooperative. General Notes: Patient's wound bed currently showed signs of good epithelization at this point. Obviously this has been good news as far as wounds are concerned although he still has an issue with edema and again in the past every time we have transition to his own compression he has had issues with edema control. Nonetheless at this point I think that he is under good control I am hoping get him into his compression socks will be beneficial for him and then subsequently he will be seeing vascular on Thursday to see what they think they can do to potentially help him as well. If there is really nothing vascular can do he continues to have issues ongoing with this recurrence and I think lymphedema clinic would be his next best option. Assessment Active Problems ICD-10 Type 2 diabetes mellitus with other skin ulcer Lymphedema, not elsewhere classified Venous insufficiency (chronic) (peripheral) Non-pressure chronic ulcer of other part of left lower leg with fat layer exposed Essential (primary) hypertension Plan AAHIL, GILE ([id number]) Discharge From [facility] Services: Discharge from [place] wearing own compressions hose, call with any questions. Thank you 1. My suggestion currently is good to be that we discontinue wound care services as he is completely healed and has  an appointment with vascular on Thursday we will passed off care to them as far as his edema control is concerned. 2. He is to wear his compression stockings at all times when he is awake at home and subsequently also needs to be using his lymphedema pumps on a regular basis for 1 hour 2 times a day to help keep edema under control. 3. I would recommend as well that when he sitting he needs to elevate his legs as much as possible. We will see the patient back for a follow-up visit as needed if anything changes or worsens. Electronic Signature(s) Signed: 04/09/2019 5:47:36 PM By: Worthy Keeler PA-C Entered By: Worthy Keeler on 04/08/2019 09:24:30 [name] ([id number]) -------------------------------------------------------------------------------- ROS/PFSH Details Patient Name: [name] Date of Service: 04/08/2019 9:00 AM  Medical Record Number: [id number] Patient Account Number: [id number] Date of Birth/Sex: 03-Jun-1950 (68 y.o. M) Treating RN: Harold Barban Primary Care Provider: SYSTEM, PCP Other Clinician: Referring Provider: Referral, Self Treating Provider/Extender: STONE III, Joyanne Eddinger Weeks in Treatment: 21 Information Obtained From Patient Constitutional Symptoms (General Health) Complaints and Symptoms: Negative for: Fatigue; Fever; Chills; Marked Weight Change Respiratory Complaints and Symptoms: Negative for: Chronic or frequent coughs; Shortness of Breath Cardiovascular Complaints and Symptoms: Negative for: Chest pain; LE edema Medical History: Positive for: Hypertension; Peripheral Venous Disease Negative for: Angina; Arrhythmia; Congestive Heart Failure; Coronary Artery Disease; Deep Vein Thrombosis; Hypotension; Myocardial Infarction; Peripheral Arterial Disease; Phlebitis; Vasculitis Psychiatric Complaints and Symptoms: Negative for: Anxiety; Claustrophobia Endocrine Medical History: Positive for: Type II Diabetes Treated with: Oral  agents Integumentary (Skin) Medical History: Negative for: History of Burn; History of pressure wounds Neurologic Medical History: Positive for: Neuropathy Negative for: Dementia; Quadriplegia; Paraplegia; Seizure Disorder Oncologic Medical History: Positive for: Received Chemotherapy Negative for: Received Radiation YAASIR, TROTTA ([id number]) Past Medical History Notes: 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 Immunizations Pneumococcal Vaccine: Received Pneumococcal Vaccination: Yes Immunization Notes: up to date Implantable Devices None Family and Social History Cancer: Yes - Mother; Diabetes: No; Heart Disease: No; Hereditary Spherocytosis: No; Hypertension: No; Kidney Disease: No; Lung Disease: No; Seizures: No; Stroke: Yes - Father; Thyroid Problems: No; Tuberculosis: No; Former smoker - quit 5 years ago; Marital Status - Divorced; Alcohol Use: Never; Drug Use: No History; Caffeine Use: Daily; Financial Concerns: No; Food, Clothing or Shelter Needs: No; Support System Lacking: No; Transportation Concerns: No Physician Affirmation I have reviewed and agree with the above information. Electronic Signature(s) Signed: 04/09/2019 5:47:36 PM By: Worthy Keeler PA-C Signed: 04/10/2019 4:10:47 PM By: Harold Barban Entered By: Worthy Keeler on 04/08/2019 09:23:01 [name] ([id number]) -------------------------------------------------------------------------------- SuperBill Details Patient Name: [name] Date of Service: 04/08/2019 Medical Record Number: [id number] Patient Account Number: [id number] Date of Birth/Sex: Jun 02, 1950 (68 y.o. M) Treating RN: Harold Barban Primary Care Provider: SYSTEM, PCP Other Clinician: Referring Provider: Referral, Self Treating Provider/Extender: STONE III, Diantha Paxson Weeks in Treatment: 21 Diagnosis Coding ICD-10 Codes Code Description E11.622 Type 2 diabetes mellitus with other skin ulcer I89.0 Lymphedema, not elsewhere classified I87.2  Venous insufficiency (chronic) (peripheral) L97.822 Non-pressure chronic ulcer of other part of left lower leg with fat layer exposed I10 Essential (primary) hypertension Facility Procedures CPT4 Code: [id number] Description: [id number] - WOUND CARE VISIT-LEV 2 EST PT Modifier: Quantity: 1 Physician Procedures CPT4 Code Description: [id number] - WC PHYS LEVEL 4 - EST PT ICD-10 Diagnosis Description E11.622 Type 2 diabetes mellitus with other skin ulcer I89.0 Lymphedema, not elsewhere classified I87.2 Venous insufficiency (chronic) (peripheral) L97.822  Non-pressure chronic ulcer of other part of left lower leg wit Modifier: h fat layer expos Quantity: 1 ed Electronic Signature(s) Signed: 04/09/2019 5:47:36 PM By: Worthy Keeler PA-C Entered By: Worthy Keeler on 04/08/2019 09:24:45

## 2019-04-10 NOTE — Progress Notes (Signed)
MONICA, TALMAN ([id number]) Visit Report for 04/08/2019 Arrival Information Details Patient Name: [name], [name] Date of Service: 04/08/2019 9:00 AM Medical Record Number: [id number] Patient Account Number: [id number] Date of Birth/Sex: [birth year] (68 y.o. M) Treating RN: Army Melia Primary Care Leyton Magoon: SYSTEM, PCP Other Clinician: Referring Nikkolas Coomes: Referral, Self Treating Grizelda Piscopo/Extender: STONE III, [name] Weeks in Treatment: 21 Visit Information History Since Last Visit Added or deleted any medications: No Patient Arrived: Ambulatory Any new allergies or adverse reactions: No Arrival Time: 08:54 Had a fall or experienced change in No Accompanied By: self activities of daily living that may affect Transfer Assistance: None risk of falls: Patient Identification Verified: Yes Signs or symptoms of abuse/neglect since last visito No Patient Has Alerts: Yes Hospitalized since last visit: No Patient Alerts: DMII Has Dressing in Place as Prescribed: Yes Pain Present Now: No Electronic Signature(s) Signed: 04/08/2019 12:57:44 PM By: Army Melia Entered By: Army Melia on 04/08/2019 08:54:55 [name] ([id number]) -------------------------------------------------------------------------------- Clinic Level of Care Assessment Details Patient Name: [name] Date of Service: 04/08/2019 9:00 AM Medical Record Number: [id number] Patient Account Number: [id number] Date of Birth/Sex: Dec 13, 1950 (68 y.o. M) Treating RN: Harold Barban Primary Care Jeannine Pennisi: SYSTEM, PCP Other Clinician: Referring Suezette Lafave: Referral, Self Treating Selen Smucker/Extender: STONE III, [name] Weeks in Treatment: 21 Clinic Level of Care Assessment Items TOOL 4 Quantity Score []  - Use when only an EandM is performed on FOLLOW-UP visit 0 ASSESSMENTS - Nursing Assessment / Reassessment X - Reassessment of Co-morbidities (includes updates in patient status) 1 10 X- 1 5 Reassessment of Adherence to Treatment  Plan ASSESSMENTS - Wound and Skin Assessment / Reassessment X - Simple Wound Assessment / Reassessment - one wound 1 5 []  - 0 Complex Wound Assessment / Reassessment - multiple wounds []  - 0 Dermatologic / Skin Assessment (not related to wound area) ASSESSMENTS - Focused Assessment []  - Circumferential Edema Measurements - multi extremities 0 []  - 0 Nutritional Assessment / Counseling / Intervention []  - 0 Lower Extremity Assessment (monofilament, tuning fork, pulses) []  - 0 Peripheral Arterial Disease Assessment (using hand held doppler) ASSESSMENTS - Ostomy and/or Continence Assessment and Care []  - Incontinence Assessment and Management 0 []  - 0 Ostomy Care Assessment and Management (repouching, etc.) PROCESS - Coordination of Care X - Simple Patient / Family Education for ongoing care 1 15 []  - 0 Complex (extensive) Patient / Family Education for ongoing care []  - 0 Staff obtains Programmer, systems, Records, Test Results / Process Orders []  - 0 Staff telephones HHA, Nursing Homes / Clarify orders / etc []  - 0 Routine Transfer to another Facility (non-emergent condition) []  - 0 Routine Hospital Admission (non-emergent condition) []  - 0 New Admissions / Biomedical engineer / Ordering NPWT, Apligraf, etc. []  - 0 Emergency Hospital Admission (emergent condition) X- 1 10 Simple Discharge Coordination [place], SONNI ([id number]) []  - 0 Complex (extensive) Discharge Coordination PROCESS - Special Needs []  - Pediatric / Minor Patient Management 0 []  - 0 Isolation Patient Management []  - 0 Hearing / Language / Visual special needs []  - 0 Assessment of Community assistance (transportation, D/C planning, etc.) []  - 0 Additional assistance / Altered mentation []  - 0 Support Surface(s) Assessment (bed, cushion, seat, etc.) INTERVENTIONS - Wound Cleansing / Measurement X - Simple Wound Cleansing - one wound 1 5 []  - 0 Complex Wound Cleansing - multiple wounds X- 1 5 Wound  Imaging (photographs - any number of wounds) []  - 0 Wound Tracing (instead of photographs) X- 1 5 Simple Wound Measurement - one  wound []  - 0 Complex Wound Measurement - multiple wounds INTERVENTIONS - Wound Dressings X - Small Wound Dressing one or multiple wounds 1 10 []  - 0 Medium Wound Dressing one or multiple wounds []  - 0 Large Wound Dressing one or multiple wounds []  - 0 Application of Medications - topical []  - 0 Application of Medications - injection INTERVENTIONS - Miscellaneous []  - External ear exam 0 []  - 0 Specimen Collection (cultures, biopsies, blood, body fluids, etc.) []  - 0 Specimen(s) / Culture(s) sent or taken to Lab for analysis []  - 0 Patient Transfer (multiple staff / Civil Service fast streamer / Similar devices) []  - 0 Simple Staple / Suture removal (25 or less) []  - 0 Complex Staple / Suture removal (26 or more) []  - 0 Hypo / Hyperglycemic Management (close monitor of Blood Glucose) []  - 0 Ankle / Brachial Index (ABI) - do not check if billed separately X- 1 5 Vital Signs MCCALL, BEAUCHESNE ([id number]) Has the patient been seen at the hospital within the last three years: Yes Total Score: 75 Level Of Care: New/Established - Level 2 Electronic Signature(s) Signed: 04/10/2019 4:10:47 PM By: Harold Barban Entered By: Harold Barban on 04/08/2019 09:18:35 [name] ([id number]) -------------------------------------------------------------------------------- Encounter Discharge Information Details Patient Name: [name] Date of Service: 04/08/2019 9:00 AM Medical Record Number: [id number] Patient Account Number: [id number] Date of Birth/Sex: Sep 22, 1950 (68 y.o. M) Treating RN: Harold Barban Primary Care Ahjanae [place]el: SYSTEM, PCP Other Clinician: Referring Taevyn Hausen: Referral, Self Treating Jaelee [name]/Extender: STONE III, [name] Weeks in Treatment: 21 Encounter Discharge Information Items Discharge Condition: Stable Ambulatory Status: Ambulatory Discharge  Destination: Home Transportation: Private Auto Accompanied By: self Schedule Follow-up Appointment: Yes Clinical Summary of Care: Electronic Signature(s) Signed: 04/10/2019 4:10:47 PM By: Harold Barban Entered By: Harold Barban on 04/08/2019 09:21:05 [name] ([id number]) -------------------------------------------------------------------------------- Lower Extremity Assessment Details Patient Name: [name] Date of Service: 04/08/2019 9:00 AM Medical Record Number: [id number] Patient Account Number: [id number] Date of Birth/Sex: 07-27-1950 (68 y.o. M) Treating RN: Army Melia Primary Care Charman Blasco: SYSTEM, PCP Other Clinician: Referring Jaydalynn Olivero: Referral, Self Treating Skylor Hughson/Extender: STONE III, [name] Weeks in Treatment: 21 Edema Assessment Assessed: [Left: No] [Right: No] Edema: [Left: Yes] [Right: Yes] Calf Left: Right: Point of Measurement: 35 cm From Medial Instep 46.7 cm 43 cm Ankle Left: Right: Point of Measurement: 11 cm From Medial Instep 31.5 cm 29 cm Vascular Assessment Pulses: Dorsalis Pedis Palpable: [Left:Yes] [Right:Yes] Electronic Signature(s) Signed: 04/08/2019 12:57:44 PM By: Army Melia Entered By: Army Melia on 04/08/2019 09:00:25 [name] ([id number]) -------------------------------------------------------------------------------- Multi Wound Chart Details Patient Name: [name] Date of Service: 04/08/2019 9:00 AM Medical Record Number: [id number] Patient Account Number: [id number] Date of Birth/Sex: [birth year] (68 y.o. M) Treating RN: Harold Barban Primary Care Jaci Desanto: SYSTEM, PCP Other Clinician: Referring Willard Farquharson: Referral, Self Treating Ladd Cen/Extender: STONE III, [name] Weeks in Treatment: 21 Vital Signs Height(in): 73 Pulse(bpm): 55 Weight(lbs): 300 Blood Pressure(mmHg): 160/57 Body Mass Index(BMI): 40 Temperature(F): 98.6 Respiratory Rate 16 (breaths/min): Wound Assessments Treatment  Notes Electronic Signature(s) Signed: 04/10/2019 4:10:47 PM By: Harold Barban Entered By: Harold Barban on 04/08/2019 09:17:31 [name] ([id number]) -------------------------------------------------------------------------------- Multi-Disciplinary Care Plan Details Patient Name: [name] Date of Service: 04/08/2019 9:00 AM Medical Record Number: [id number] Patient Account Number: [id number] Date of Birth/Sex: 11/10/1950 (68 y.o. M) Treating RN: Harold Barban Primary Care Esmae Donathan: SYSTEM, PCP Other Clinician: Referring Uri Turnbough: Referral, Self Treating Edge Mauger/Extender: Melburn Hake, [name] Weeks in Treatment: 21 Active Inactive Electronic Signature(s) Signed: 04/10/2019 4:10:47 PM By: Harold Barban Entered By: Harold Barban on  04/08/2019 10:40:12 OTHELL, MCQUEARY ([id number]) -------------------------------------------------------------------------------- Pain Assessment Details Patient Name: LUVERN, FINKBINER Date of Service: 04/08/2019 9:00 AM Medical Record Number: [id number] Patient Account Number: [id number] Date of Birth/Sex: March 25, 1951 (68 y.o. M) Treating RN: Army Melia Primary Care Makaley Storts: SYSTEM, PCP Other Clinician: Referring Clarivel Callaway: Referral, Self Treating Fionna Merriott/Extender: STONE III, [name] Weeks in Treatment: 21 Active Problems Location of Pain Severity and Description of Pain Patient Has Paino No Site Locations Pain Management and Medication Current Pain Management: Electronic Signature(s) Signed: 04/08/2019 12:57:44 PM By: Army Melia Entered By: Army Melia on 04/08/2019 08:55:01 [name] ([id number]) -------------------------------------------------------------------------------- Patient/Caregiver Education Details Patient Name: [name] Date of Service: 04/08/2019 9:00 AM Medical Record Number: [id number] Patient Account Number: [id number] Date of Birth/Gender: 07-May-1951 (68 y.o. M) Treating RN: Harold Barban Primary Care  Physician: SYSTEM, PCP Other Clinician: Referring Physician: Referral, Self Treating Physician/Extender: Melburn Hake, [name] Weeks in Treatment: 21 Education Assessment Education Provided To: Patient Education Topics Provided Venous: Handouts: Controlling Swelling with Compression Stockings Methods: Demonstration, Explain/Verbal Responses: State content correctly Electronic Signature(s) Signed: 04/10/2019 4:10:47 PM By: Harold Barban Entered By: Harold Barban on 04/08/2019 09:17:51 [name] ([id number]) -------------------------------------------------------------------------------- [place] Details Patient Name: [name] Date of Service: 04/08/2019 9:00 AM Medical Record Number: [id number] Patient Account Number: [id number] Date of Birth/Sex: 01/15/1951 (68 y.o. M) Treating RN: Army Melia Primary Care Adalai Perl: SYSTEM, PCP Other Clinician: Referring Tou Hayner: Referral, Self Treating Neldon Shepard/Extender: STONE III, [name] Weeks in Treatment: 21 Vital Signs Time Taken: 08:55 Temperature (F): 98.6 Height (in): 73 Pulse (bpm): 55 Weight (lbs): 300 Respiratory Rate (breaths/min): 16 Body Mass Index (BMI): 39.6 Blood Pressure (mmHg): 160/57 Reference Range: 80 - 120 mg / dl Electronic Signature(s) Signed: 04/08/2019 12:57:44 PM By: Army Melia Entered By: Army Melia on 04/08/2019 08:55:43

## 2019-04-11 ENCOUNTER — Encounter: Payer: PPO | Attending: Physician Assistant | Admitting: Physician Assistant

## 2019-04-11 ENCOUNTER — Other Ambulatory Visit: Payer: Self-pay

## 2019-04-11 ENCOUNTER — Ambulatory Visit (INDEPENDENT_AMBULATORY_CARE_PROVIDER_SITE_OTHER): Payer: PPO | Admitting: Vascular Surgery

## 2019-04-11 ENCOUNTER — Encounter (INDEPENDENT_AMBULATORY_CARE_PROVIDER_SITE_OTHER): Payer: Self-pay | Admitting: Vascular Surgery

## 2019-04-11 ENCOUNTER — Ambulatory Visit (INDEPENDENT_AMBULATORY_CARE_PROVIDER_SITE_OTHER): Payer: PPO

## 2019-04-11 VITALS — BP 132/76 | HR 64 | Resp 16 | Wt 338.6 lb

## 2019-04-11 DIAGNOSIS — Z809 Family history of malignant neoplasm, unspecified: Secondary | ICD-10-CM | POA: Insufficient documentation

## 2019-04-11 DIAGNOSIS — I872 Venous insufficiency (chronic) (peripheral): Secondary | ICD-10-CM

## 2019-04-11 DIAGNOSIS — E1151 Type 2 diabetes mellitus with diabetic peripheral angiopathy without gangrene: Secondary | ICD-10-CM | POA: Diagnosis not present

## 2019-04-11 DIAGNOSIS — I509 Heart failure, unspecified: Secondary | ICD-10-CM | POA: Insufficient documentation

## 2019-04-11 DIAGNOSIS — L97822 Non-pressure chronic ulcer of other part of left lower leg with fat layer exposed: Secondary | ICD-10-CM | POA: Insufficient documentation

## 2019-04-11 DIAGNOSIS — I110 Hypertensive heart disease with heart failure: Secondary | ICD-10-CM | POA: Insufficient documentation

## 2019-04-11 DIAGNOSIS — E1142 Type 2 diabetes mellitus with diabetic polyneuropathy: Secondary | ICD-10-CM | POA: Diagnosis not present

## 2019-04-11 DIAGNOSIS — I890 Lymphedema, not elsewhere classified: Secondary | ICD-10-CM | POA: Insufficient documentation

## 2019-04-11 DIAGNOSIS — I10 Essential (primary) hypertension: Secondary | ICD-10-CM | POA: Diagnosis not present

## 2019-04-11 DIAGNOSIS — L97909 Non-pressure chronic ulcer of unspecified part of unspecified lower leg with unspecified severity: Secondary | ICD-10-CM

## 2019-04-11 DIAGNOSIS — E11620 Type 2 diabetes mellitus with diabetic dermatitis: Secondary | ICD-10-CM

## 2019-04-11 DIAGNOSIS — L97929 Non-pressure chronic ulcer of unspecified part of left lower leg with unspecified severity: Secondary | ICD-10-CM | POA: Diagnosis not present

## 2019-04-11 DIAGNOSIS — E782 Mixed hyperlipidemia: Secondary | ICD-10-CM | POA: Diagnosis not present

## 2019-04-11 DIAGNOSIS — Z856 Personal history of leukemia: Secondary | ICD-10-CM | POA: Diagnosis not present

## 2019-04-11 DIAGNOSIS — I83009 Varicose veins of unspecified lower extremity with ulcer of unspecified site: Secondary | ICD-10-CM | POA: Diagnosis not present

## 2019-04-11 DIAGNOSIS — L97919 Non-pressure chronic ulcer of unspecified part of right lower leg with unspecified severity: Secondary | ICD-10-CM

## 2019-04-11 DIAGNOSIS — Z87891 Personal history of nicotine dependence: Secondary | ICD-10-CM | POA: Insufficient documentation

## 2019-04-11 DIAGNOSIS — E11622 Type 2 diabetes mellitus with other skin ulcer: Secondary | ICD-10-CM | POA: Insufficient documentation

## 2019-04-11 NOTE — Progress Notes (Signed)
MRN : [id number]  [name] is a 68 y.o. (September 06, 1950) male who presents with chief complaint of  Chief Complaint  Patient presents with  . New Patient (Initial Visit)    ref Stone LLE wounds  .  History of Present Illness:   Patient is seen for evaluation of leg pain and swelling associated with new onset ulceration bilaterally. The patient first noticed the swelling remotely. The swelling is associated with pain and discoloration. The pain and swelling worsens with prolonged dependency and improves with elevation. The pain is unrelated to activity.  The patient notes that in the morning the legs are better but the leg symptoms worsened throughout the course of the day. The patient has also noted a progressive worsening of the discoloration in the ankle and shin area.   The patient notes that an ulcer has developed acutely without specific trauma and since it occurred it has been very slow to heal.  There is a moderate amount of drainage associated with the open area.  The wound is also very painful.  The patient denies claudication symptoms or rest pain symptoms.  The patient denies DJD and LS spine disease.  The patient has not had any past angiography, interventions or vascular surgery.  Elevation makes the leg symptoms better, dependency makes them much worse. The patient denies any recent changes in medications.  The patient has not been wearing graduated compression.  The patient denies a history of DVT or PE. There is no prior history of phlebitis. There is no history of primary lymphedema.  No history of malignancies. No history of trauma or groin or pelvic surgery. There is no history of radiation treatment to the groin or pelvis   ABI's obtained today are normal bilaterally.  This is consistent with the previous noninvasive study obtained 06/23/2016    Current Meds  Medication Sig  . bisoprolol-hydrochlorothiazide (ZIAC) 10-6.25 MG tablet Take 1 tablet by mouth daily.   . furosemide (LASIX) 20 MG tablet Take 20 mg by mouth.  Marland Kitchen glimepiride (AMARYL) 4 MG tablet   . hydrALAZINE (APRESOLINE) 10 MG tablet Take 10 mg by mouth 3 (three) times daily.  Marland Kitchen lisinopril-hydrochlorothiazide (PRINZIDE,ZESTORETIC) 20-12.5 MG tablet Take 1 tablet by mouth daily.  . metFORMIN (GLUCOPHAGE) 1000 MG tablet Take 1,000 mg by mouth 2 (two) times daily with a meal.  . pioglitazone (ACTOS) 15 MG tablet Take 15 mg by mouth daily.  . pravastatin (PRAVACHOL) 80 MG tablet Take 80 mg by mouth daily.    Past Medical History:  Diagnosis Date  . Diabetes mellitus without complication ([place])   . Hyperlipidemia   . Hypertension   . Leukemia [facility])     Past Surgical History:  Procedure Laterality Date  . CATARACT EXTRACTION Right   . TONSILLECTOMY    . TOOTH EXTRACTION      Social History Social History   Tobacco Use  . Smoking status: Former Research scientist (life sciences)  . Smokeless tobacco: Never Used  Substance Use Topics  . Alcohol use: No  . Drug use: No    Family History Family History  Problem Relation Age of Onset  . Cancer Mother   . Stroke Father   . Cancer Sister     No Known Allergies   REVIEW OF SYSTEMS (Negative unless checked)  Constitutional: [] Weight loss  [] Fever  [] Chills Cardiac: [] Chest pain   [] Chest pressure   [] Palpitations   [] Shortness of breath when laying flat   [] Shortness of breath with exertion. Vascular:  []   Pain in legs with walking   [x] Pain in legs at rest  [] History of DVT   [] Phlebitis   [x] Swelling in legs   [] Varicose veins   [x] Non-healing ulcers Pulmonary:   [] Uses home oxygen   [] Productive cough   [] Hemoptysis   [] Wheeze  [] COPD   [] Asthma Neurologic:  [] Dizziness   [] Seizures   [] History of stroke   [] History of TIA  [] Aphasia   [] Vissual changes   [] Weakness or numbness in arm   [] Weakness or numbness in leg Musculoskeletal:   [] Joint swelling   [] Joint pain   [] Low back pain Hematologic:  [] Easy bruising  [] Easy bleeding   [] Hypercoagulable  state   [] Anemic Gastrointestinal:  [] Diarrhea   [] Vomiting  [] Gastroesophageal reflux/heartburn   [] Difficulty swallowing. Genitourinary:  [] Chronic kidney disease   [] Difficult urination  [] Frequent urination   [] Blood in urine Skin:  [x] Rashes   [x] Ulcers  Psychological:  [] History of anxiety   []  History of major depression.  Physical Examination  Vitals:   04/11/19 0815  BP: 132/76  Pulse: 64  Resp: 16  Weight: (!) 338 lb 9.6 oz (153.6 kg)   Body mass index is 44.67 kg/m. Gen: WD/WN, NAD Head: [place]/AT, No temporalis wasting.  Ear/Nose/Throat: Hearing grossly intact, nares w/o erythema or drainage Eyes: PER, EOMI, sclera nonicteric.  Neck: Supple, no large masses.   Pulmonary:  Good air movement, no audible wheezing bilaterally, no use of accessory muscles.  Cardiac: RRR, no JVD Vascular: 4+ edema bilaterally with severe venous changes bilaterally.  Venous ulcers noted in the ankle and shin area bilaterally, noninfected. Vessel Right Left  PT Not Palpable Not Palpable  DP Not Palpable Not Palpable  Gastrointestinal: Non-distended. No guarding/no peritoneal signs.  Musculoskeletal: M/S 5/5 throughout.  No deformity or atrophy.  Neurologic: CN 2-12 intact. Symmetrical.  Speech is fluent. Motor exam as listed above. Psychiatric: Judgment intact, Mood & affect appropriate for pt's clinical situation. Dermatologic: Venous stasis dermatitis with ulcers present.  No changes consistent with cellulitis. Lymph : No lichenification or skin changes of chronic lymphedema.  CBC No results found for: WBC, HGB, HCT, MCV, PLT  BMET No results found for: NA, K, CL, CO2, GLUCOSE, BUN, CREATININE, CALCIUM, GFRNONAA, GFRAA CrCl cannot be calculated (No successful lab value found.).  COAG No results found for: INR, PROTIME  Radiology No results found.   Assessment/Plan 1. Venous ulcer ([place]) No surgery or intervention at this point in time.    I have had a long discussion with the  patient regarding venous insufficiency and why it  causes symptoms, specifically venous ulceration . I have discussed with the patient the chronic skin changes that accompany venous insufficiency and the long term sequela such as infection and recurring  ulceration.  Patient will be placed in [employer] which will be changed weekly drainage permitting.  In addition, behavioral modification including several periods of elevation of the lower extremities during the day will be continued. Achieving a position with the ankles at heart level was stressed to the patient  The patient is instructed to begin routine exercise, especially walking on a daily basis  Patient should undergo duplex ultrasound of the venous system to ensure that DVT or reflux is not present.  Following the review of the ultrasound the patient will follow up in one week to reassess the degree of swelling and the control that Unna therapy is offering.   The patient can be assessed for graduated compression stockings or wraps as well as a  Lymph Pump once the ulcers are healed.   2. Chronic venous insufficiency No surgery or intervention at this point in time.    I have had a long discussion with the patient regarding venous insufficiency and why it  causes symptoms, specifically venous ulceration . I have discussed with the patient the chronic skin changes that accompany venous insufficiency and the long term sequela such as infection and recurring  ulceration.  Patient will be placed in [employer] which will be changed weekly drainage permitting.  In addition, behavioral modification including several periods of elevation of the lower extremities during the day will be continued. Achieving a position with the ankles at heart level was stressed to the patient  The patient is instructed to begin routine exercise, especially walking on a daily basis  Patient should undergo duplex ultrasound of the venous system to ensure that DVT or  reflux is not present.  Following the review of the ultrasound the patient will follow up in one week to reassess the degree of swelling and the control that Unna therapy is offering.   The patient can be assessed for graduated compression stockings or wraps as well as a Lymph Pump once the ulcers are healed.   3. Lymphedema  No surgery or intervention at this point in time.    I have reviewed my discussion with the patient regarding lymphedema and why it  causes symptoms.  Patient will continue wearing graduated compression stockings class 1 (20-30 mmHg) on a daily basis a prescription was given. The patient is reminded to put the stockings on first thing in the morning and removing them in the evening. The patient is instructed specifically not to sleep in the stockings.   In addition, behavioral modification throughout the day will be continued.  This will include frequent elevation (such as in a recliner), use of over the counter pain medications as needed and exercise such as walking.  I have reviewed systemic causes for chronic edema such as liver, kidney and cardiac etiologies and there does not appear to be any significant changes in these organ systems over the past year.  The patient is under the impression that these organ systems are all stable and unchanged.    The patient will continue aggressive use of the  lymph pump.  This will continue to improve the edema control and prevent sequela such as ulcers and infections.   4. Essential hypertension Continue antihypertensive medications as already ordered, these medications have been reviewed and there are no changes at this time.   5. Type 2 diabetes mellitus with diabetic dermatitis, without long-term current use of insulin ([place]) Continue hypoglycemic medications as already ordered, these medications have been reviewed and there are no changes at this time.  Hgb A1C to be monitored as already arranged by primary service   6.  Mixed hyperlipidemia Continue statin as ordered and reviewed, no changes at this time     Hortencia Pilar, MD  04/11/2019 10:41 AM

## 2019-04-11 NOTE — Progress Notes (Signed)
ARIHANT, BYRDSONG ([id number]) Visit Report for 04/11/2019 Arrival Information Details Patient Name: [name], [name] Date of Service: 04/11/2019 3:00 PM Medical Record Number: [id number] Patient Account Number: [id number] Date of Birth/Sex: [birth year] (68 y.o. M) Treating RN: Army Melia Primary Care Tyronn Golda: SYSTEM, PCP Other Clinician: Referring Akela Pocius: Referral, Self Treating Nikki Glanzer/Extender: STONE III, [name] Weeks in Treatment: 21 Visit Information History Since Last Visit Added or deleted any medications: No Patient Arrived: Ambulatory Any new allergies or adverse reactions: No Arrival Time: 15:02 Had a fall or experienced change in No Accompanied By: self activities of daily living that may affect Transfer Assistance: None risk of falls: Patient Identification Verified: Yes Signs or symptoms of abuse/neglect since last visito No Secondary Verification Process Completed: Yes Hospitalized since last visit: No Patient Has Alerts: Yes Implantable device outside of the clinic excluding No Patient Alerts: DMII cellular tissue based products placed in the center since last visit: Has Dressing in Place as Prescribed: Yes Pain Present Now: No Electronic Signature(s) Signed: 04/11/2019 4:37:51 PM By: Lorine Bears RCP, RRT, CHT Entered By: Lorine Bears on 04/11/2019 15:04:01 [name] ([id number]) -------------------------------------------------------------------------------- Clinic Level of Care Assessment Details Patient Name: [name] Date of Service: 04/11/2019 3:00 PM Medical Record Number: [id number] Patient Account Number: [id number] Date of Birth/Sex: 27-Sep-1950 (68 y.o. M) Treating RN: Army Melia Primary Care Eyden Dobie: SYSTEM, PCP Other Clinician: Referring Denver Harder: Referral, Self Treating Itzel Mckibbin/Extender: STONE III, [name] Weeks in Treatment: 21 Clinic Level of Care Assessment Items TOOL 4 Quantity Score []  - Use when only an  EandM is performed on FOLLOW-UP visit 0 ASSESSMENTS - Nursing Assessment / Reassessment X - Reassessment of Co-morbidities (includes updates in patient status) 1 10 X- 1 5 Reassessment of Adherence to Treatment Plan ASSESSMENTS - Wound and Skin Assessment / Reassessment []  - Simple Wound Assessment / Reassessment - one wound 0 X- 3 5 Complex Wound Assessment / Reassessment - multiple wounds []  - 0 Dermatologic / Skin Assessment (not related to wound area) ASSESSMENTS - Focused Assessment []  - Circumferential Edema Measurements - multi extremities 0 []  - 0 Nutritional Assessment / Counseling / Intervention []  - 0 Lower Extremity Assessment (monofilament, tuning fork, pulses) []  - 0 Peripheral Arterial Disease Assessment (using hand held doppler) ASSESSMENTS - Ostomy and/or Continence Assessment and Care []  - Incontinence Assessment and Management 0 []  - 0 Ostomy Care Assessment and Management (repouching, etc.) PROCESS - Coordination of Care X - Simple Patient / Family Education for ongoing care 1 15 []  - 0 Complex (extensive) Patient / Family Education for ongoing care X- 1 10 Staff obtains Programmer, systems, Records, Test Results / Process Orders []  - 0 Staff telephones HHA, Nursing Homes / Clarify orders / etc []  - 0 Routine Transfer to another Facility (non-emergent condition) []  - 0 Routine Hospital Admission (non-emergent condition) []  - 0 New Admissions / Biomedical engineer / Ordering NPWT, Apligraf, etc. []  - 0 Emergency Hospital Admission (emergent condition) X- 1 10 Simple Discharge Coordination Lav[place], DAWIT ([id number]) []  - 0 Complex (extensive) Discharge Coordination PROCESS - Special Needs []  - Pediatric / Minor Patient Management 0 []  - 0 Isolation Patient Management []  - 0 Hearing / Language / Visual special needs []  - 0 Assessment of Community assistance (transportation, D/C planning, etc.) []  - 0 Additional assistance / Altered mentation []  -  0 Support Surface(s) Assessment (bed, cushion, seat, etc.) INTERVENTIONS - Wound Cleansing / Measurement []  - Simple Wound Cleansing - one wound 0 X- 3 5 Complex Wound Cleansing - multiple wounds  X- 1 5 Wound Imaging (photographs - any number of wounds) []  - 0 Wound Tracing (instead of photographs) []  - 0 Simple Wound Measurement - one wound X- 3 5 Complex Wound Measurement - multiple wounds INTERVENTIONS - Wound Dressings []  - Small Wound Dressing one or multiple wounds 0 X- 3 15 Medium Wound Dressing one or multiple wounds []  - 0 Large Wound Dressing one or multiple wounds []  - 0 Application of Medications - topical []  - 0 Application of Medications - injection INTERVENTIONS - Miscellaneous []  - External ear exam 0 []  - 0 Specimen Collection (cultures, biopsies, blood, body fluids, etc.) []  - 0 Specimen(s) / Culture(s) sent or taken to Lab for analysis []  - 0 Patient Transfer (multiple staff / Civil Service fast streamer / Similar devices) []  - 0 Simple Staple / Suture removal (25 or less) []  - 0 Complex Staple / Suture removal (26 or more) []  - 0 Hypo / Hyperglycemic Management (close monitor of Blood Glucose) []  - 0 Ankle / Brachial Index (ABI) - do not check if billed separately X- 1 5 Vital Signs LANDYNN, ENGERT ([id number]) Has the patient been seen at the hospital within the last three years: Yes Total Score: 150 Level Of Care: New/Established - Level 4 Electronic Signature(s) Signed: 04/11/2019 4:43:12 PM By: Army Melia Entered By: Army Melia on 04/11/2019 15:59:01 [name] ([id number]) -------------------------------------------------------------------------------- Compression Therapy Details Patient Name: [name] Date of Service: 04/11/2019 3:00 PM Medical Record Number: [id number] Patient Account Number: [id number] Date of Birth/Sex: Oct 02, 1950 (68 y.o. M) Treating RN: Army Melia Primary Care Devaris Quirk: SYSTEM, PCP Other Clinician: Referring Spyros Winch:  Referral, Self Treating Marcelle Hepner/Extender: STONE III, [name] Weeks in Treatment: 21 Compression Therapy Performed for Wound Assessment: Wound #10 Left,Proximal,Anterior Lower Leg Performed By: Clinician Army Melia, RN Compression Type: Four Layer Pre Treatment ABI: 1.5 Post Procedure Diagnosis Same as Pre-procedure Electronic Signature(s) Signed: 04/11/2019 4:43:12 PM By: Army Melia Entered By: Army Melia on 04/11/2019 15:51:08 [name] ([id number]) -------------------------------------------------------------------------------- Compression Therapy Details Patient Name: [name] Date of Service: 04/11/2019 3:00 PM Medical Record Number: [id number] Patient Account Number: [id number] Date of Birth/Sex: 25-May-1950 (68 y.o. M) Treating RN: Army Melia Primary Care Brent Noto: SYSTEM, PCP Other Clinician: Referring Jadore Veals: Referral, Self Treating Rhiann Boucher/Extender: STONE III, [name] Weeks in Treatment: 21 Compression Therapy Performed for Wound Assessment: NonWound Condition Lymphedema - Right Leg Performed By: Clinician Army Melia, RN Compression Type: Four Layer Post Procedure Diagnosis Same as Pre-procedure Electronic Signature(s) Signed: 04/11/2019 4:43:12 PM By: Army Melia Entered By: Army Melia on 04/11/2019 15:51:53 [name] ([id number]) -------------------------------------------------------------------------------- Encounter Discharge Information Details Patient Name: [name] Date of Service: 04/11/2019 3:00 PM Medical Record Number: [id number] Patient Account Number: [id number] Date of Birth/Sex: March 08, 1951 (68 y.o. M) Treating RN: Army Melia Primary Care Lurlene Ronda: SYSTEM, PCP Other Clinician: Referring Quinnton Bury: Referral, Self Treating Esvin Hnat/Extender: STONE III, [name] Weeks in Treatment: 21 Encounter Discharge Information Items Discharge Condition: Stable Ambulatory Status: Ambulatory Discharge Destination: Home Transportation: Private  Auto Accompanied By: self Schedule Follow-up Appointment: Yes Clinical Summary of Care: Electronic Signature(s) Signed: 04/11/2019 4:43:12 PM By: Army Melia Entered By: Army Melia on 04/11/2019 16:00:14 [name] ([id number]) -------------------------------------------------------------------------------- Lower Extremity Assessment Details Patient Name: [name] Date of Service: 04/11/2019 3:00 PM Medical Record Number: [id number] Patient Account Number: [id number] Date of Birth/Sex: Aug 26, 1950 (68 y.o. M) Treating RN: Montey Hora Primary Care Tzipporah Nagorski: SYSTEM, PCP Other Clinician: Referring Khaalid Lefkowitz: Referral, Self Treating Aiven Kampe/Extender: STONE III, [name] Weeks in Treatment: 21 Edema Assessment Assessed: [Left: No] [Right: No] Edema: [  Left: Yes] [Right: Yes] Calf Left: Right: Point of Measurement: 35 cm From Medial Instep 43 cm 42.5 cm Ankle Left: Right: Point of Measurement: 11 cm From Medial Instep 34.5 cm 31 cm Vascular Assessment Pulses: Dorsalis Pedis Palpable: [Left:Yes] [Right:Yes] Electronic Signature(s) Signed: 04/11/2019 5:00:08 PM By: Montey Hora Entered By: Montey Hora on 04/11/2019 15:27:08 [name] ([id number]) -------------------------------------------------------------------------------- Multi Wound Chart Details Patient Name: [name] Date of Service: 04/11/2019 3:00 PM Medical Record Number: [id number] Patient Account Number: [id number] Date of Birth/Sex: 1950/12/21 (68 y.o. M) Treating RN: Army Melia Primary Care Blair Mesina: SYSTEM, PCP Other Clinician: Referring Ruey Storer: Referral, Self Treating Joua Bake/Extender: STONE III, [name] Weeks in Treatment: 21 Vital Signs Height(in): 73 Pulse(bpm): 82 Weight(lbs): 300 Blood Pressure(mmHg): 136/51 Body Mass Index(BMI): 40 Temperature(F): 98.3 Respiratory Rate 18 (breaths/min): Photos: [N/A:N/A] Wound Location: Left Lower Leg - Anterior, Left Lower Leg - Anterior,  N/A Proximal Distal Wounding Event: Blister Blister N/A Primary Etiology: Lymphedema Lymphedema N/A Comorbid History: Hypertension, Peripheral Hypertension, Peripheral N/A Venous Disease, Type II Venous Disease, Type II Diabetes, Neuropathy, Diabetes, Neuropathy, Received Chemotherapy Received Chemotherapy Date Acquired: 04/10/2019 04/10/2019 N/A Weeks of Treatment: 0 0 N/A Wound Status: Open Open N/A Measurements L x W x D 5.8x1.4x0.1 5x7.5x0.1 N/A (cm) Area (cm) : 6.377 29.452 N/A Volume (cm) : 0.638 2.945 N/A % Reduction in Area: 0.00% 0.00% N/A % Reduction in Volume: 0.00% 0.00% N/A Classification: Full Thickness Without Full Thickness Without N/A Exposed Support Structures Exposed Support Structures Exudate Amount: Large Large N/A Exudate Type: Serous Serous N/A Exudate Color: amber amber N/A Wound Margin: Flat and Intact Flat and Intact N/A Granulation Amount: Large (67-100%) Large (67-100%) N/A Granulation Quality: Pink Pink N/A Necrotic Amount: None Present (0%) None Present (0%) N/A Exposed Structures: Fat Layer (Subcutaneous Fat Layer (Subcutaneous N/A Tissue) Exposed: Yes Tissue) Exposed: Yes Fascia: No Fascia: No Quaintance, Mong ([id number]) Tendon: No Tendon: No Muscle: No Muscle: No Joint: No Joint: No Bone: No Bone: No Epithelialization: None None N/A Treatment Notes Electronic Signature(s) Signed: 04/11/2019 4:43:12 PM By: Army Melia Entered By: Army Melia on 04/11/2019 15:50:36 [name] ([id number]) -------------------------------------------------------------------------------- Multi-Disciplinary Care Plan Details Patient Name: [name] Date of Service: 04/11/2019 3:00 PM Medical Record Number: [id number] Patient Account Number: [id number] Date of Birth/Sex: February 20, 1951 (68 y.o. M) Treating RN: Army Melia Primary Care Minervia Osso: SYSTEM, PCP Other Clinician: Referring Maykel Reitter: Referral, Self Treating Alannah Averhart/Extender: STONE III,  [name] Weeks in Treatment: 21 Active Inactive Electronic Signature(s) Signed: 04/11/2019 4:43:12 PM By: Army Melia Entered By: Army Melia on 04/11/2019 15:50:26 [name] ([id number]) -------------------------------------------------------------------------------- Non-Wound Condition Assessment Details Patient Name: [name] Date of Service: 04/11/2019 3:00 PM Medical Record Number: [id number] Patient Account Number: [id number] Date of Birth/Sex: 10-22-1950 (68 y.o. M) Treating RN: Montey Hora Primary Care Kellyjo Edgren: SYSTEM, PCP Other Clinician: Referring Abdulmalik Darco: Referral, Self Treating Emersen Carroll/Extender: STONE III, [name] Weeks in Treatment: 21 Non-Wound Condition: Condition: Lymphedema Location: Leg Side: Right Photos Electronic Signature(s) Signed: 04/11/2019 5:00:08 PM By: Montey Hora Entered By: Montey Hora on 04/11/2019 15:28:49 [name] ([id number]) -------------------------------------------------------------------------------- Pain Assessment Details Patient Name: [name] Date of Service: 04/11/2019 3:00 PM Medical Record Number: [id number] Patient Account Number: [id number] Date of Birth/Sex: [birth year] (68 y.o. M) Treating RN: Army Melia Primary Care Frances Ambrosino: SYSTEM, PCP Other Clinician: Referring Vane Yapp: Referral, Self Treating Carol Loftin/Extender: STONE III, [name] Weeks in Treatment: 21 Active Problems Location of Pain Severity and Description of Pain Patient Has Paino No Site Locations Pain Management and Medication Current Pain Management: Electronic Signature(s) Signed: 04/11/2019 4:37:51  PM By: Paulla Fore, RRT, CHT Signed: 04/11/2019 4:43:12 PM By: Army Melia Entered By: Lorine Bears on 04/11/2019 15:04:09 [name] ([id number]) -------------------------------------------------------------------------------- Patient/Caregiver Education Details Patient Name: [name] Date of  Service: 04/11/2019 3:00 PM Medical Record Number: [id number] Patient Account Number: [id number] Date of Birth/Gender: 1950-10-27 (68 y.o. M) Treating RN: Army Melia Primary Care Physician: SYSTEM, PCP Other Clinician: Referring Physician: Referral, Self Treating Physician/Extender: Melburn Hake, [name] Weeks in Treatment: 21 Education Assessment Education Provided To: Patient Education Topics Provided Wound/Skin Impairment: Handouts: Caring for Your Ulcer Methods: Demonstration, Explain/Verbal Responses: State content correctly Electronic Signature(s) Signed: 04/11/2019 4:43:12 PM By: Army Melia Entered By: Army Melia on 04/11/2019 15:59:28 [name] ([id number]) -------------------------------------------------------------------------------- Wound Assessment Details Patient Name: [name] Date of Service: 04/11/2019 3:00 PM Medical Record Number: [id number] Patient Account Number: [id number] Date of Birth/Sex: [birth year] (68 y.o. M) Treating RN: Montey Hora Primary Care Tonesha Tsou: SYSTEM, PCP Other Clinician: Referring Fountain Derusha: Referral, Self Treating Ilina Xu/Extender: STONE III, [name] Weeks in Treatment: 21 Wound Status Wound Number: 10 Primary Lymphedema Etiology: Wound Location: Left Lower Leg - Anterior, Proximal Wound Open Wounding Event: Blister Status: Date Acquired: 04/10/2019 Comorbid Hypertension, Peripheral Venous Disease, Type Weeks Of Treatment: 0 History: II Diabetes, Neuropathy, Received Clustered Wound: No Chemotherapy Photos Wound Measurements Length: (cm) 5.8 Width: (cm) 1.4 Depth: (cm) 0.1 Area: (cm) 6.377 Volume: (cm) 0.638 % Reduction in Area: 0% % Reduction in Volume: 0% Epithelialization: None Tunneling: No Undermining: No Wound Description Full Thickness Without Exposed Support Classification: Structures Wound Margin: Flat and Intact Exudate Large Amount: Exudate Type: Serous Exudate Color: amber Foul Odor After  Cleansing: No Slough/Fibrino No Wound Bed Granulation Amount: Large (67-100%) Exposed Structure Granulation Quality: Pink Fascia Exposed: No Necrotic Amount: None Present (0%) Fat Layer (Subcutaneous Tissue) Exposed: Yes Tendon Exposed: No Muscle Exposed: No Joint Exposed: No Bone Exposed: No Tinner, Kaylor ([id number]) Treatment Notes Wound #10 (Left, Proximal, Anterior Lower Leg) Notes silver cell, xtrasorb, 4layer bilateral with unna to anchor Electronic Signature(s) Signed: 04/11/2019 5:00:08 PM By: Montey Hora Entered By: Montey Hora on 04/11/2019 15:27:35 [name] ([id number]) -------------------------------------------------------------------------------- Wound Assessment Details Patient Name: [name] Date of Service: 04/11/2019 3:00 PM Medical Record Number: [id number] Patient Account Number: [id number] Date of Birth/Sex: 08/31/1950 (68 y.o. M) Treating RN: Montey Hora Primary Care Nesa Distel: SYSTEM, PCP Other Clinician: Referring Edvardo Honse: Referral, Self Treating Parke Jandreau/Extender: STONE III, [name] Weeks in Treatment: 21 Wound Status Wound Number: 11 Primary Lymphedema Etiology: Wound Location: Left Lower Leg - Anterior, Distal Wound Open Wounding Event: Blister Status: Date Acquired: 04/10/2019 Comorbid Hypertension, Peripheral Venous Disease, Type Weeks Of Treatment: 0 History: II Diabetes, Neuropathy, Received Clustered Wound: No Chemotherapy Photos Wound Measurements Length: (cm) 5 Width: (cm) 7.5 Depth: (cm) 0.1 Area: (cm) 29.452 Volume: (cm) 2.945 % Reduction in Area: 0% % Reduction in Volume: 0% Epithelialization: None Tunneling: No Undermining: No Wound Description Full Thickness Without Exposed Support Classification: Structures Wound Margin: Flat and Intact Exudate Large Amount: Exudate Type: Serous Exudate Color: amber Foul Odor After Cleansing: No Slough/Fibrino No Wound Bed Granulation Amount: Large (67-100%)  Exposed Structure Granulation Quality: Pink Fascia Exposed: No Necrotic Amount: None Present (0%) Fat Layer (Subcutaneous Tissue) Exposed: Yes Tendon Exposed: No Muscle Exposed: No Joint Exposed: No Bone Exposed: No Derasmo, [name] ([id number]) Treatment Notes Wound #11 (Left, Distal, Anterior Lower Leg) Notes silver cell, xtrasorb, 4layer bilateral with unna to anchor Electronic Signature(s) Signed: 04/11/2019 5:00:08 PM By: Montey Hora Entered By: Montey Hora on 04/11/2019 15:27:56 [place],  [name] ([id number]) -------------------------------------------------------------------------------- Vitals Details Patient Name: ROCK, RIZK Date of Service: 04/11/2019 3:00 PM Medical Record Number: [id number] Patient Account Number: [id number] Date of Birth/Sex: 09-May-1951 (68 y.o. M) Treating RN: Army Melia Primary Care Elton Heid: SYSTEM, PCP Other Clinician: Referring Neetu Carrozza: Referral, Self Treating Tempie Gibeault/Extender: STONE III, [name] Weeks in Treatment: 21 Vital Signs Time Taken: 15:05 Temperature (F): 98.3 Height (in): 73 Pulse (bpm): 53 Weight (lbs): 300 Respiratory Rate (breaths/min): 18 Body Mass Index (BMI): 39.6 Blood Pressure (mmHg): 136/51 Reference Range: 80 - 120 mg / dl Electronic Signature(s) Signed: 04/11/2019 4:37:51 PM By: Lorine Bears RCP, RRT, CHT Entered By: Lorine Bears on 04/11/2019 15:08:01

## 2019-04-11 NOTE — Progress Notes (Addendum)
[name] ([id number]) Visit Report for 04/11/2019 Chief Complaint Document Details Patient Name: [name] Date of Service: 04/11/2019 3:00 PM Medical Record Number: [id number] Patient Account Number: [id number] Date of Birth/Sex: [birth year] (68 y.o. M) Treating RN: Army Melia Primary Care Provider: SYSTEM, PCP Other Clinician: Referring Provider: Referral, Self Treating Provider/Extender: [name] Weeks in Treatment: 21 Information Obtained from: Patient Chief Complaint Left LE ulcers Electronic Signature(s) Signed: 04/11/2019 3:18:17 PM By: Worthy Keeler PA-C Entered By: Worthy Keeler on 04/11/2019 15:18:17 [name] ([id number]) -------------------------------------------------------------------------------- HPI Details Patient Name: [name] Date of Service: 04/11/2019 3:00 PM Medical Record Number: [id number] Patient Account Number: [id number] Date of Birth/Sex: 02-24-1951 (68 y.o. M) Treating RN: Army Melia Primary Care Provider: SYSTEM, PCP Other Clinician: Referring Provider: Referral, Self Treating Provider/Extender: [name] III, Kehinde Totzke Weeks in Treatment: 21 History of Present Illness HPI Description: 68 year old male who presented to the ER with bilateral lower extremity blisters which had started last week. he has a past medical history of leukemia, diabetes mellitus, hypertension, edema of both lower extremities, his recurrent skin infections, peripheral vascular disease, coronary artery disease, congestive heart failure and peripheral neuropathy. in the ER he was given Rocephin and put on Silvadene cream. he was put on oral doxycycline and was asked to follow-up with the [facility]. His last hemoglobin A1c was 6.6 in December and he checks his blood sugar once a week. He does not have any physicians outside the [place] system. He does not recall any vascular duplex studies done either for arterial or venous disease but was told to wear  compression stockings which he does not use 05/30/2016 -- we have not yet received any of his notes from the [facility] hospital system and his arterial and venous duplex studies are scheduled here in [place] around mid February. We are unable to have his insurance accepted by home health agencies and hence he is getting dressings only once a week. 06/06/16 -- -- I received a call from the patient's PCP at the [facility] and spoke to Dr. Garvin Fila, phone number [phone] and fax number [phone]. She confirmed that no vascular testing was done over the last 5 years and she would be happy to do them if the patient did want them to be done at the [place] and we could fax him a request. Readmission: 68 year old male seen by as in February of this year and was referred to vein and vascular for studies and opinion from the vascular surgeons. The patient returns today with a fresh problem having had blisters on his left lower extremity which have been there for about 5 days and he clearly states that he has been wearing his compression stockings as advised though he could not read the moderate compression and has been wearing light compression. Review of his electronic medical records note that he had lower extremity arterial duplex examination done on 06/23/2016 which showed no hemodynamically significant stenosis in the bilateral lower extremity arterial system. He also had a lower extremity venous reflux examination done on 07/07/2016 and it was noted that he had venous incompetence in the right great saphenous vein and bilateral common femoral veins. Patient was seen by Dr. Tamala Julian on the same day and for some reason his notes do not reflect the venous studies or the arterial studies and he recommended patient do a venous duplex ultrasound to look for reflux and return to see him.he would also consider a lymph pump if required. The patient was told that  his workup was normal and hence the  patient canceled his follow-up appointment. 02/03/17 on evaluation today patient left medial lower extremity blister appears to be doing about the same. It is still continuing to drain and there's still the blistered skin covering the wound bed which is making it difficult for the alternate to do its job. Fortunately there is no evidence of cellulitis. No fevers chills noted. Patient states in general he is not having any significant discomfort. Patient's lower extremity arterial duplex exam revealed that patient was hemodynamically stable with no evidence of stenosis in regard to the bilateral lower extremities. The lower extremity venous reflux exam revealed the patient had venous incontinence noted in the right greater saphenous and bilateral common femoral vein. There is no evidence of deep or superficial vein thrombosis in the bilateral lower extremities. Readmission: [name], [name] ([id number]) 11/12/18 Patient presents for evaluation our clinic today concerning issues that he is having with his left lower extremity. He tells me that a couple weeks ago he began developing blisters on the left lower extremity along with increased swelling. He typically wears his compression stockings on a regular basis is previously been evaluated both here as well is with vascular surgery they would recommend lymphedema pumps but unfortunately that somehow fell through and he never heard anything back from that. Nonetheless I think lymphedema pumps would be beneficial for this patient. He does have a history of hypertension and diabetes. Obviously the chronic venous stasis and lymphedema as well. At this point the blisters have been given in more trouble he states sometimes when the blisters openings able to clean it down with alcohol and it will dry out and do well. Unfortunately that has not been the case this time. He is having some discomfort although this mean these with cleaning the areas he doesn't have  discomfort just on a regular basis. He has not been able to wear his compression stockings since the blisters arose due to the fact that of course it will drain into the socks causing additional issues and he didn't have any way to wrap this otherwise. He has increased to taking his Lasix every day instead of every other day. He sees his primary care provider later this month as well. No fevers, chills, nausea, or vomiting noted at this time. 11/19/18-Patient returns at 1 week, per intake RN the amount of seepage into the compression wraps was definitely improved, overall all the wounds are measuring smaller but continuing silver alginate to the wounds as primary dressing 11/26/18 on evaluation today patient appears to be doing quite well in regard to his left lower [place] ulcers. In fact of the areas that were noted initially he only has two regions still open. There is no evidence of active infection at this time. He still is not heard anything from the company regarding lymphedema pumps as of yet. Again as previously seen vascular they have not recommended any surgical intervention. 12/03/2018 on evaluation today patient actually appears to be doing quite well with regard to his lower extremity ulcers. In fact most of the areas appear to be healed the one spot which does not seem to be completely healed I am unsure of whether or not this is really draining that much but nonetheless there does not appear to be any signs of infection or significant drainage at this point. There is no sign of fever, chills, nausea, vomiting, or diarrhea. Overall I am pleased with how things have progressed I think is very close  to being able to transition to his home compression stockings. 12/10/2018 upon evaluation today patient appears to be doing quite well with regard to his left lower extremity. He has been tolerating the dressing changes without complication. Fortunately there is no signs of active infection at  this time. He appears after thorough evaluation of his leg to only have 1 small area that remains open at this point everything else appears to be almost completely closed. He still have significant swelling of the left lower extremity. We had discussed discussing this with his primary care provider he is not able to see her in person they were at the [facility] and right now the [place] is not seeing patients on site. According to the patient anyway. Subsequently he did speak with her apparently and his primary care provider feels that he may likely have a DVT. With that being said she has not seen his leg she is just going off of his history. Nonetheless that is a concern that the patient now has as well and while I do not feel the DVT is likely we can definitely ensure that that is not the case I will go ahead and see about putting that order in today. Nonetheless otherwise I am in a recommend that we continue with the current wound care measures including the compression therapy most likely. We just need to ensure that his leg is indeed free of any DVTs. 12/17/2018 on evaluation today patient actually appears to be completely healed today. He does have 2 very small areas of blistering although this is not anything too significant at this point which is good news. With that being said I am in agreement with the fact that I think he is completely healed at this point. He does want to get back into his compression stocking. The good news is we have gotten approval from insurance for his lymphedema pumps we received a letter since last saw him last week. The other good news is his study did come back and showed no evidence of a DVT. 12/20/2018 on evaluation today patient presents for follow-up concerning his ongoing issues with his left lower extremity. He was actually discharged last Friday and did fairly well until he states blisters opened this morning. He tells me he has been wearing his compression  stocking although he has a hard time getting this on. There does not appear to be any signs of active infection at this time. No fevers, chills, nausea, vomiting, or diarrhea. 12/27/2018 on evaluation today patient appears to be doing very well with regard to his swelling of the left lower extremity the 4 layer compression wrap seems to have been beneficial for him. Fortunately there is no signs of active infection at this time. Patient has been tolerating the compression wrap without complication and his foot swelling in particular appears to be greatly improved. He does still have a wound on the lateral portion of his left leg I believe this is more of a blister that has now reopened. 01/03/2019 on evaluation today patient actually appears to be doing excellent in regard to his left lower extremity. He did receive his compression pumps and is actually use this 7 times since he was last here in the office. On top of the compression wrap he is now roughly 3 cm better at the calf and 2 cm better at the ankle he also states that his foot seem to go an issue better without even having to use a shoe horn.  Obviously I think this is all evidence that he is doing excellent in this regard. The other good news is he does not appear to have anything open today as far as wounds are concerned. [name], [name] ([id number]) 01/15/2019 on evaluation today patient appears to be doing more poorly yet again with regard to his left lower extremity. He has developed new wounds again after being discharged just recently. Unfortunately this continues to be the case that he will heal and then have subsequent new wounds. The last time I was hopeful that he may not end up coming back too quickly especially since he states he has been using his lymphedema pumps along with wearing his compression. Nonetheless he had a blister on the back of his leg that popped up on the left and this has opened up into an ulceration it is quite  painful. 01/22/19 on evaluation today patient actually appears to be doing well with regard to his wound on the left lower extremity. He's been tolerating the dressing changes without complication including the compression wrap in the wound appears to be significantly smaller today which is great news. Overall very pleased in this regard. 01/29/2019 on evaluation today patient appears to be doing well with regard to his left posterior lower extremity ulcer. He has been tolerating the dressing changes without complication. This is not completely healed but is getting much closer. We did order a Farrow wrap 4000 for him he has received this and has it with him today although I am not sure we are quite ready to start him on that as of yet. We are very close. 02/05/2019 on evaluation today patient actually appears to be doing quite well with regard to his left posterior lower extremity ulcer. He still has a very tiny opening remaining but the fortunate thing is he seems to be healing quite nicely. He also did get his Farrow wrap which I am hoping will help with his edema control as well at home. Fortunately there is no evidence of active infection. 02/12/2019 patient and fortunately appears to be doing poorly in regard to his wounds of the left lower extremity. He was very close to healing therefore we attempted to use his Velcro compression wraps continuing with lymphedema pumps at home. Unfortunately that does not seem to have done very well for him. He tells me that he wore them all the time but again I am not sure why if that is the case that he is having such significant edema. He is still on his fluid pills as well. With that being said there is no obvious sign of infection although I do wonder about the possibility of infection at this time as well. 02/19/2019 unfortunately upon evaluation today patient appears to be doing more poorly with regard to his left lower extremity. He is not showing signs  of significant improvement and I think the biggest issue here is that he does have an infection that appears to likely be Pseudomonas. That is based on the blue-green drainage that were noted at this time. Unfortunately the antibiotic that has been on is not going to take care of this at all. I think they will get a need to switch him to either Levaquin or Cipro and this was discussed with the patient. 02/26/2019 on evaluation today patient's lower extremity on the left appears to be doing significantly better as compared to last evaluation. Fortunately there is no signs of active infection at this time. He has been tolerating the  compression wrap without complication in fact he made it the whole week at this point. He is showing signs of excellent improvement I am very happy in this regard. With that being said he is having some issues with infection we did review the results of his culture which I noted today. He did have a positive finding for Enterobacter as well as Alcaligenes faecalis. Fortunately the Levaquin that I placed him on will work for both which is great news. There is no signs of systemic infection at this point. 10/30; left posterior leg wound in the setting of very significant edema and what looks like chronic venous inflammation. He has compression pumps but does not use them. We have been using 3 layer compression. Silver alginate to the wound as the primary dressing 03/18/2019 on evaluation today patient appears to be doing a little better compared to last time I saw him. He really has not been using his compression pumps he tells me that he is having too much discomfort. He has been keeping his wraps on however. He is only been taking his fluid pills every other day because he states they are not really helping and he has an appointment with his primary care provider at the [facility] tomorrow. Subsequently the wound itself on the left lower extremity does seem to be greatly improved  compared to previous. 03/25/2019 on evaluation today patient appears to be doing better with regard to his wounds on the bilateral lower extremities. The left is doing excellent the right is also doing better although both still do show some signs of open wounds noted at this point unfortunately. Fortunately there is no signs of active infection at this time. The patient also is not really having any significant pain which is good news. Unfortunately there was some confusion with the referral on vascular disease and as far as getting the patient scheduled there can be contacting him later today to do this fortunately we got this straightened out. 04/01/2019 on evaluation today patient appears to be doing no fevers, chills, nausea, vomiting, or diarrhea. Excellent at this time with regard to his lower extremities. There does not appear to be any open wound at this point which is good news. Fortunately is also no signs of active infection at this time. Overall feel like the patient has done excellent with the compression the problem is every time we got him to this point and then subsequently go to using his own compression [name], [name] ([id number]) things just go right back to where they were. I am not sure how to address this we can try to get an appointment with vascular for 2 weeks now they have yet to call him. Obviously this has become frustrating for the patient as well. I think the issue has just been an honest error as far as scheduling is concerned but nonetheless still worn out the point where I am unsure of which direction we should take. 04/08/2019 on evaluation today patient actually appears to be doing well with regard to his lower extremities. There are no open wounds at this time and things seem to be managing quite nicely as far as the overall edema control is concerned. With that being said he does have his compression socks today for [place] to go ahead and reinitiate therapy in that  manner at this point. He is going to be going for shoes to be measured on Wednesday and then coincidentally he will also be seeing vascular on Thursday. Overall I think  this is good news and again I am hopeful that they will be able to do something for him to help prevent ongoing issues with edema control as well. No fevers, chills, nausea, vomiting, or diarrhea. 04/11/2019 on evaluation today patient actually appears to be doing poorly after just being discharged on Monday of this week. He had been experiencing issues with again blisters especially on the left lower extremity. With that being said he was completely healed and appeared to be doing great this past Monday. He then subsequently has new blisters that formed before his appointment with vascular this morning. He was also measured for shoes in the interim. With that being said we may have figured out what exactly is going on and why he continues to have issues like what we are seeing at this point. He takes his compression stockings off at nighttime and then he ends up having to sleep in his chair for 5-6 hours a night. He sleeps with his feet down he cannot really get him up in the recliner and therefore he is sleeping and the worst possible his position with his feet on the floor for that majority of the time. Again as I explained to him that is about one third at minimum at least one fourth of his day that he spending with his feet dangling down on the ground and the worst possible position they could be. I think this may be what is causing the issue. Subsequently I am leaning toward thinking that he may need a hospital bed in order to elevate his legs. We likely can have to coordinate this with his primary care provider at the [place] Signature(s) Signed: 04/11/2019 4:05:20 PM By: Worthy Keeler PA-C Entered By: Worthy Keeler on 04/11/2019 16:05:20 [name]  ([id number]) -------------------------------------------------------------------------------- Physical Exam Details Patient Name: [name] Date of Service: 04/11/2019 3:00 PM Medical Record Number: [id number] Patient Account Number: [id number] Date of Birth/Sex: 12-10-1950 (68 y.o. M) Treating RN: Army Melia Primary Care Provider: SYSTEM, PCP Other Clinician: Referring Provider: Referral, Self Treating Provider/Extender: [name] III, Aishi Courts Weeks in Treatment: 21 Constitutional Well-nourished and well-hydrated in no acute distress. Respiratory normal breathing without difficulty. clear to auscultation bilaterally. Cardiovascular regular rate and rhythm with normal S1, S2. Psychiatric this patient is able to make decisions and demonstrates good insight into disease process. Alert and Oriented x 3. pleasant and cooperative. Notes Patient's wound bed currently again shows multiple blisters over the bilateral lower extremities upon evaluation today unfortunately. This is I think causing him some issues obviously with pain but I am also concerned about the fact that obviously it did not take hardly any time whatsoever for this to open back up after being discharged just on Monday. Fortunately there is no signs of active infection but unfortunately he does have significant wounds that are now again to take some time to get healed yet again. 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 Electronic Signature(s) Signed: 04/11/2019 4:06:08 PM By: Worthy Keeler PA-C Entered By: Worthy Keeler on 04/11/2019 16:06:07 [name] ([id number]) -------------------------------------------------------------------------------- Physician Orders Details Patient Name: [name] Date of Service: 04/11/2019 3:00 PM Medical Record Number: [id number] Patient Account Number: [id number] Date of Birth/Sex: [birth year] (68 y.o.  M) Treating RN: Army Melia Primary Care Provider: SYSTEM, PCP Other Clinician: Referring Provider: Referral, Self Treating Provider/Extender: [name] III, Anaeli Cornwall Weeks in Treatment: 21 Verbal / Phone  Orders: No Diagnosis Coding ICD-10 Coding Code Description E11.622 Type 2 diabetes mellitus with other skin ulcer I89.0 Lymphedema, not elsewhere classified I87.2 Venous insufficiency (chronic) (peripheral) L97.822 Non-pressure chronic ulcer of other part of left lower leg with fat layer exposed I10 Essential (primary) hypertension Wound Cleansing Wound #10 Left,Proximal,Anterior Lower Leg o Clean wound with Normal Saline. Wound #11 Left,Distal,Anterior Lower Leg o Clean wound with Normal Saline. Primary Wound Dressing Wound #10 Left,Proximal,Anterior Lower Leg o Silver Alginate Wound #11 Left,Distal,Anterior Lower Leg o Silver Alginate Secondary Dressing Wound #10 Left,Proximal,Anterior Lower Leg o XtraSorb Wound #11 Left,Distal,Anterior Lower Leg o XtraSorb Dressing Change Frequency Wound #10 Left,Proximal,Anterior Lower Leg o Change dressing every week Wound #11 Left,Distal,Anterior Lower Leg o Change dressing every week Follow-up Appointments Wound #10 Left,Proximal,Anterior Lower Leg o Return Appointment in 1 week. Wound #11 Left,Distal,Anterior Lower Leg o Return Appointment in 1 week. [place], [name] ([id number]) Edema Control Wound #10 Left,Proximal,Anterior Lower Leg o 4 Layer Compression System - Bilateral - anchor with unna o Elevate legs to the level of the heart and pump ankles as often as possible o Compression Pump: Use compression pump on left lower extremity for 60 minutes, twice daily. o Compression Pump: Use compression pump on right lower extremity for 60 minutes, twice daily. Wound #11 Left,Distal,Anterior Lower Leg o 4 Layer Compression System - Bilateral - anchor with unna o Elevate legs to the level of the heart and pump  ankles as often as possible o Compression Pump: Use compression pump on left lower extremity for 60 minutes, twice daily. o Compression Pump: Use compression pump on right lower extremity for 60 minutes, twice daily. Electronic Signature(s) Signed: 04/11/2019 4:43:12 PM By: Army Melia Signed: 04/11/2019 5:16:54 PM By: Worthy Keeler PA-C Entered By: Army Melia on 04/11/2019 15:58:07 [name] ([id number]) -------------------------------------------------------------------------------- Problem List Details Patient Name: [name] Date of Service: 04/11/2019 3:00 PM Medical Record Number: [id number] Patient Account Number: [id number] Date of Birth/Sex: 09/04/1950 (68 y.o. M) Treating RN: Army Melia Primary Care Provider: SYSTEM, PCP Other Clinician: Referring Provider: Referral, Self Treating Provider/Extender: Melburn Hake, Tamirah George Weeks in Treatment: 21 Active Problems ICD-10 Evaluated Encounter Code Description Active Date Today Diagnosis E11.622 Type 2 diabetes mellitus with other skin ulcer 11/12/2018 No Yes I89.0 Lymphedema, not elsewhere classified 11/12/2018 No Yes I87.2 Venous insufficiency (chronic) (peripheral) 11/12/2018 No Yes L97.822 Non-pressure chronic ulcer of other part of left lower leg with 11/12/2018 No Yes fat layer exposed I10 Essential (primary) hypertension 11/12/2018 No Yes Inactive Problems Resolved Problems Electronic Signature(s) Signed: 04/11/2019 3:18:11 PM By: Worthy Keeler PA-C Entered By: Worthy Keeler on 04/11/2019 15:18:11 [name] ([id number]) -------------------------------------------------------------------------------- Progress Note Details Patient Name: [name] Date of Service: 04/11/2019 3:00 PM Medical Record Number: [id number] Patient Account Number: [id number] Date of Birth/Sex: 05/15/1950 (68 y.o. M) Treating RN: Army Melia Primary Care Provider: SYSTEM, PCP Other Clinician: Referring Provider: Referral, Self Treating  Provider/Extender: [name] III, Morene Cecilio Weeks in Treatment: 21 Subjective Chief Complaint Information obtained from Patient Left LE ulcers History of Present Illness (HPI) 68 year old male who presented to the ER with bilateral lower extremity blisters which had started last week. he has a past medical history of leukemia, diabetes mellitus, hypertension, edema of both lower extremities, his recurrent skin infections, peripheral vascular disease, coronary artery disease, congestive heart failure and peripheral neuropathy. in the ER he was given Rocephin and put on Silvadene cream. he was put on oral doxycycline and was asked to follow-up with the [facility]. His last  hemoglobin A1c was 6.6 in December and he checks his blood sugar once a week. He does not have any physicians outside the [place] system. He does not recall any vascular duplex studies done either for arterial or venous disease but was told to wear compression stockings which he does not use 05/30/2016 -- we have not yet received any of his notes from the [facility] hospital system and his arterial and venous duplex studies are scheduled here in [place] around mid February. We are unable to have his insurance accepted by home health agencies and hence he is getting dressings only once a week. 06/06/16 -- -- I received a call from the patient's PCP at the [facility] and spoke to Dr. Garvin Fila, phone number [phone] and fax number [phone]. She confirmed that no vascular testing was done over the last 5 years and she would be happy to do them if the patient did want them to be done at the [place] and we could fax him a request. Readmission: 68 year old male seen by as in February of this year and was referred to vein and vascular for studies and opinion from the vascular surgeons. The patient returns today with a fresh problem having had blisters on his left lower extremity which have been there for about 5 days and he clearly states  that he has been wearing his compression stockings as advised though he could not read the moderate compression and has been wearing light compression. Review of his electronic medical records note that he had lower extremity arterial duplex examination done on 06/23/2016 which showed no hemodynamically significant stenosis in the bilateral lower extremity arterial system. He also had a lower extremity venous reflux examination done on 07/07/2016 and it was noted that he had venous incompetence in the right great saphenous vein and bilateral common femoral veins. Patient was seen by Dr. Tamala Julian on the same day and for some reason his notes do not reflect the venous studies or the arterial studies and he recommended patient do a venous duplex ultrasound to look for reflux and return to see him.he would also consider a lymph pump if required. The patient was told that his workup was normal and hence the patient canceled his follow-up appointment. 02/03/17 on evaluation today patient left medial lower extremity blister appears to be doing about the same. It is still continuing to drain and there's still the blistered skin covering the wound bed which is making it difficult for the alternate to do its job. Fortunately there is no evidence of cellulitis. No fevers chills noted. Patient states in general he is not having any significant discomfort. Patient's lower extremity arterial duplex exam revealed that patient was hemodynamically stable with no evidence of stenosis in regard to the bilateral lower extremities. The lower extremity venous reflux exam revealed the patient had venous incontinence noted in the right greater saphenous [place], [name] ([id number]) and bilateral common femoral vein. There is no evidence of deep or superficial vein thrombosis in the bilateral lower extremities. Readmission: 11/12/18 Patient presents for evaluation our clinic today concerning issues that he is having with his  left lower extremity. He tells me that a couple weeks ago he began developing blisters on the left lower extremity along with increased swelling. He typically wears his compression stockings on a regular basis is previously been evaluated both here as well is with vascular surgery they would recommend lymphedema pumps but unfortunately that somehow fell through and he never heard anything back from that.  Nonetheless I think lymphedema pumps would be beneficial for this patient. He does have a history of hypertension and diabetes. Obviously the chronic venous stasis and lymphedema as well. At this point the blisters have been given in more trouble he states sometimes when the blisters openings able to clean it down with alcohol and it will dry out and do well. Unfortunately that has not been the case this time. He is having some discomfort although this mean these with cleaning the areas he doesn't have discomfort just on a regular basis. He has not been able to wear his compression stockings since the blisters arose due to the fact that of course it will drain into the socks causing additional issues and he didn't have any way to wrap this otherwise. He has increased to taking his Lasix every day instead of every other day. He sees his primary care provider later this month as well. No fevers, chills, nausea, or vomiting noted at this time. 11/19/18-Patient returns at 1 week, per intake RN the amount of seepage into the compression wraps was definitely improved, overall all the wounds are measuring smaller but continuing silver alginate to the wounds as primary dressing 11/26/18 on evaluation today patient appears to be doing quite well in regard to his left lower [place] ulcers. In fact of the areas that were noted initially he only has two regions still open. There is no evidence of active infection at this time. He still is not heard anything from the company regarding lymphedema pumps as of yet.  Again as previously seen vascular they have not recommended any surgical intervention. 12/03/2018 on evaluation today patient actually appears to be doing quite well with regard to his lower extremity ulcers. In fact most of the areas appear to be healed the one spot which does not seem to be completely healed I am unsure of whether or not this is really draining that much but nonetheless there does not appear to be any signs of infection or significant drainage at this point. There is no sign of fever, chills, nausea, vomiting, or diarrhea. Overall I am pleased with how things have progressed I think is very close to being able to transition to his home compression stockings. 12/10/2018 upon evaluation today patient appears to be doing quite well with regard to his left lower extremity. He has been tolerating the dressing changes without complication. Fortunately there is no signs of active infection at this time. He appears after thorough evaluation of his leg to only have 1 small area that remains open at this point everything else appears to be almost completely closed. He still have significant swelling of the left lower extremity. We had discussed discussing this with his primary care provider he is not able to see her in person they were at the [facility] and right now the [place] is not seeing patients on site. According to the patient anyway. Subsequently he did speak with her apparently and his primary care provider feels that he may likely have a DVT. With that being said she has not seen his leg she is just going off of his history. Nonetheless that is a concern that the patient now has as well and while I do not feel the DVT is likely we can definitely ensure that that is not the case I will go ahead and see about putting that order in today. Nonetheless otherwise I am in a recommend that we continue with the current wound care measures including the  compression therapy most likely. We  just need to ensure that his leg is indeed free of any DVTs. 12/17/2018 on evaluation today patient actually appears to be completely healed today. He does have 2 very small areas of blistering although this is not anything too significant at this point which is good news. With that being said I am in agreement with the fact that I think he is completely healed at this point. He does want to get back into his compression stocking. The good news is we have gotten approval from insurance for his lymphedema pumps we received a letter since last saw him last week. The other good news is his study did come back and showed no evidence of a DVT. 12/20/2018 on evaluation today patient presents for follow-up concerning his ongoing issues with his left lower extremity. He was actually discharged last Friday and did fairly well until he states blisters opened this morning. He tells me he has been wearing his compression stocking although he has a hard time getting this on. There does not appear to be any signs of active infection at this time. No fevers, chills, nausea, vomiting, or diarrhea. 12/27/2018 on evaluation today patient appears to be doing very well with regard to his swelling of the left lower extremity the 4 layer compression wrap seems to have been beneficial for him. Fortunately there is no signs of active infection at this time. Patient has been tolerating the compression wrap without complication and his foot swelling in particular appears to be greatly improved. He does still have a wound on the lateral portion of his left leg I believe this is more of a blister that has now reopened. [name], [name] ([id number]) 01/03/2019 on evaluation today patient actually appears to be doing excellent in regard to his left lower extremity. He did receive his compression pumps and is actually use this 7 times since he was last here in the office. On top of the compression wrap he is now roughly 3 cm better at  the calf and 2 cm better at the ankle he also states that his foot seem to go an issue better without even having to use a shoe horn. Obviously I think this is all evidence that he is doing excellent in this regard. The other good news is he does not appear to have anything open today as far as wounds are concerned. 01/15/2019 on evaluation today patient appears to be doing more poorly yet again with regard to his left lower extremity. He has developed new wounds again after being discharged just recently. Unfortunately this continues to be the case that he will heal and then have subsequent new wounds. The last time I was hopeful that he may not end up coming back too quickly especially since he states he has been using his lymphedema pumps along with wearing his compression. Nonetheless he had a blister on the back of his leg that popped up on the left and this has opened up into an ulceration it is quite painful. 01/22/19 on evaluation today patient actually appears to be doing well with regard to his wound on the left lower extremity. He's been tolerating the dressing changes without complication including the compression wrap in the wound appears to be significantly smaller today which is great news. Overall very pleased in this regard. 01/29/2019 on evaluation today patient appears to be doing well with regard to his left posterior lower extremity ulcer. He has been tolerating the dressing changes without complication.  This is not completely healed but is getting much closer. We did order a Farrow wrap 4000 for him he has received this and has it with him today although I am not sure we are quite ready to start him on that as of yet. We are very close. 02/05/2019 on evaluation today patient actually appears to be doing quite well with regard to his left posterior lower extremity ulcer. He still has a very tiny opening remaining but the fortunate thing is he seems to be healing quite nicely. He also  did get his Farrow wrap which I am hoping will help with his edema control as well at home. Fortunately there is no evidence of active infection. 02/12/2019 patient and fortunately appears to be doing poorly in regard to his wounds of the left lower extremity. He was very close to healing therefore we attempted to use his Velcro compression wraps continuing with lymphedema pumps at home. Unfortunately that does not seem to have done very well for him. He tells me that he wore them all the time but again I am not sure why if that is the case that he is having such significant edema. He is still on his fluid pills as well. With that being said there is no obvious sign of infection although I do wonder about the possibility of infection at this time as well. 02/19/2019 unfortunately upon evaluation today patient appears to be doing more poorly with regard to his left lower extremity. He is not showing signs of significant improvement and I think the biggest issue here is that he does have an infection that appears to likely be Pseudomonas. That is based on the blue-green drainage that were noted at this time. Unfortunately the antibiotic that has been on is not going to take care of this at all. I think they will get a need to switch him to either Levaquin or Cipro and this was discussed with the patient. 02/26/2019 on evaluation today patient's lower extremity on the left appears to be doing significantly better as compared to last evaluation. Fortunately there is no signs of active infection at this time. He has been tolerating the compression wrap without complication in fact he made it the whole week at this point. He is showing signs of excellent improvement I am very happy in this regard. With that being said he is having some issues with infection we did review the results of his culture which I noted today. He did have a positive finding for Enterobacter as well as Alcaligenes faecalis.  Fortunately the Levaquin that I placed him on will work for both which is great news. There is no signs of systemic infection at this point. 10/30; left posterior leg wound in the setting of very significant edema and what looks like chronic venous inflammation. He has compression pumps but does not use them. We have been using 3 layer compression. Silver alginate to the wound as the primary dressing 03/18/2019 on evaluation today patient appears to be doing a little better compared to last time I saw him. He really has not been using his compression pumps he tells me that he is having too much discomfort. He has been keeping his wraps on however. He is only been taking his fluid pills every other day because he states they are not really helping and he has an appointment with his primary care provider at the [facility] tomorrow. Subsequently the wound itself on the left lower extremity does seem to be  greatly improved compared to previous. 03/25/2019 on evaluation today patient appears to be doing better with regard to his wounds on the bilateral lower extremities. The left is doing excellent the right is also doing better although both still do show some signs of open wounds noted at this point unfortunately. Fortunately there is no signs of active infection at this time. The patient also is not really having any significant pain which is good news. Unfortunately there was some confusion with the referral on vascular disease and as far as getting the patient scheduled there can be contacting him later today to do this fortunately we got this straightened out. [name], [name] ([id number]) 04/01/2019 on evaluation today patient appears to be doing no fevers, chills, nausea, vomiting, or diarrhea. Excellent at this time with regard to his lower extremities. There does not appear to be any open wound at this point which is good news. Fortunately is also no signs of active infection at this time. Overall feel  like the patient has done excellent with the compression the problem is every time we got him to this point and then subsequently go to using his own compression things just go right back to where they were. I am not sure how to address this we can try to get an appointment with vascular for 2 weeks now they have yet to call him. Obviously this has become frustrating for the patient as well. I think the issue has just been an honest error as far as scheduling is concerned but nonetheless still worn out the point where I am unsure of which direction we should take. 04/08/2019 on evaluation today patient actually appears to be doing well with regard to his lower extremities. There are no open wounds at this time and things seem to be managing quite nicely as far as the overall edema control is concerned. With that being said he does have his compression socks today for [place] to go ahead and reinitiate therapy in that manner at this point. He is going to be going for shoes to be measured on Wednesday and then coincidentally he will also be seeing vascular on Thursday. Overall I think this is good news and again I am hopeful that they will be able to do something for him to help prevent ongoing issues with edema control as well. No fevers, chills, nausea, vomiting, or diarrhea. 04/11/2019 on evaluation today patient actually appears to be doing poorly after just being discharged on Monday of this week. He had been experiencing issues with again blisters especially on the left lower extremity. With that being said he was completely healed and appeared to be doing great this past Monday. He then subsequently has new blisters that formed before his appointment with vascular this morning. He was also measured for shoes in the interim. With that being said we may have figured out what exactly is going on and why he continues to have issues like what we are seeing at this point. He takes his compression stockings  off at nighttime and then he ends up having to sleep in his chair for 5-6 hours a night. He sleeps with his feet down he cannot really get him up in the recliner and therefore he is sleeping and the worst possible his position with his feet on the floor for that majority of the time. Again as I explained to him that is about one third at minimum at least one fourth of his day that he spending with his  feet dangling down on the ground and the worst possible position they could be. I think this may be what is causing the issue. Subsequently I am leaning toward thinking that he may need a hospital bed in order to elevate his legs. We likely can have to coordinate this with his primary care provider at the [facility]. Patient History Information obtained from Patient. Family History Cancer - Mother, Stroke - Father, No family history of Diabetes, Heart Disease, Hereditary Spherocytosis, Hypertension, Kidney Disease, Lung Disease, Seizures, Thyroid Problems, Tuberculosis. Social History Former smoker - quit 5 years ago, Marital Status - Divorced, Alcohol Use - Never, Drug Use - No History, Caffeine Use - Daily. Medical History Cardiovascular Patient has history of Hypertension, Peripheral Venous Disease Denies history of Angina, Arrhythmia, Congestive Heart Failure, Coronary Artery Disease, Deep Vein Thrombosis, Hypotension, Myocardial Infarction, Peripheral Arterial Disease, Phlebitis, Vasculitis Endocrine Patient has history of Type II Diabetes Integumentary (Skin) Denies history of History of Burn, History of pressure wounds Neurologic Patient has history of Neuropathy Denies history of Dementia, Quadriplegia, Paraplegia, Seizure Disorder Oncologic Patient has history of Received Chemotherapy Denies history of Received Radiation Medical And Surgical History Notes JASYN, MARRA ([id number]) 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 Respiratory Denies complaints or symptoms of Chronic or frequent coughs, Shortness of Breath. Cardiovascular Complains or has symptoms of LE edema. Denies complaints or symptoms of Chest pain. Psychiatric Denies complaints or symptoms of Anxiety, Claustrophobia. Objective Constitutional Well-nourished and well-hydrated in no acute distress. Vitals Time Taken: 3:05 PM, Height: 73 in, Weight: 300 lbs, BMI: 39.6, Temperature: 98.3 F, Pulse: 53 bpm, Respiratory Rate: 18 breaths/min, Blood Pressure: 136/51 mmHg. Respiratory normal breathing without difficulty. clear to auscultation bilaterally. Cardiovascular regular rate and rhythm with normal S1, S2. Psychiatric this patient is able to make decisions and demonstrates good insight into disease process. Alert and Oriented x 3. pleasant and cooperative. General Notes: Patient's wound bed currently again shows multiple blisters over the bilateral lower extremities upon evaluation today unfortunately. This is I think causing him some issues obviously with pain but I am also concerned about the fact that obviously it did not take hardly any time whatsoever for this to open back up after being discharged just on Monday. Fortunately there is no signs of active infection but unfortunately he does have significant wounds that are now again to take some time to get healed yet again. 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 Integumentary (Hair, Skin) Wound #10 status is Open. Original cause of wound was Blister. The wound is located on the Left,Proximal,Anterior Lower Leg. The wound measures 5.8cm length x 1.4cm width x 0.1cm depth; 6.377cm^2 area and 0.638cm^3 volume. There is Fat Layer (Subcutaneous Tissue) Exposed exposed. There is no tunneling or undermining noted. There is a  large amount of serous drainage noted. The wound margin is flat and intact. There is large (67-100%) pink granulation within the wound bed. There is no necrotic tissue within the wound bed. Wound #11 status is Open. Original cause of wound was Blister. The wound is located on the [facility] Lower Leg. The wound measures 5cm length x 7.5cm width x 0.1cm depth; 29.452cm^2 area and 2.945cm^3 volume. There is Fat Layer (Subcutaneous Tissue) Exposed exposed. There is no tunneling or undermining noted. There is a large  amount of serous [place], [name] ([id number]) drainage noted. The wound margin is flat and intact. There is large (67-100%) pink granulation within the wound bed. There is no necrotic tissue within the wound bed. Other Condition(s) Patient presents with Lymphedema located on the Right Leg. Assessment Active Problems ICD-10 Type 2 diabetes mellitus with other skin ulcer Lymphedema, not elsewhere classified Venous insufficiency (chronic) (peripheral) Non-pressure chronic ulcer of other part of left lower leg with fat layer exposed Essential (primary) hypertension Procedures Wound #10 Pre-procedure diagnosis of Wound #10 is a Lymphedema located on the Left,Proximal,Anterior Lower Leg . There was a Four Layer Compression Therapy Procedure with a pre-treatment ABI of 1.5 by Army Melia, RN. Post procedure Diagnosis Wound #10: Same as Pre-Procedure There was a Four Layer Compression Therapy Procedure by Army Melia, RN. Post procedure Diagnosis Wound #: Same as Pre-Procedure Plan Wound Cleansing: Wound #10 Left,Proximal,Anterior Lower Leg: Clean wound with Normal Saline. Wound #11 Left,Distal,Anterior Lower Leg: Clean wound with Normal Saline. Primary Wound Dressing: Wound #10 Left,Proximal,Anterior Lower Leg: Silver Alginate Wound #11 Left,Distal,Anterior Lower Leg: Silver Alginate Secondary Dressing: Wound #10 Left,Proximal,Anterior Lower Leg: XtraSorb Wound #11  Left,Distal,Anterior Lower Leg: [name], [name] ([id number]) Dressing Change Frequency: Wound #10 Left,Proximal,Anterior Lower Leg: Change dressing every week Wound #11 Left,Distal,Anterior Lower Leg: Change dressing every week Follow-up Appointments: Wound #10 Left,Proximal,Anterior Lower Leg: Return Appointment in 1 week. Wound #11 Left,Distal,Anterior Lower Leg: Return Appointment in 1 week. Edema Control: Wound #10 Left,Proximal,Anterior Lower Leg: 4 Layer Compression System - Bilateral - anchor with unna Elevate legs to the level of the heart and pump ankles as often as possible Compression Pump: Use compression pump on left lower extremity for 60 minutes, twice daily. Compression Pump: Use compression pump on right lower extremity for 60 minutes, twice daily. Wound #11 Left,Distal,Anterior Lower Leg: 4 Layer Compression System - Bilateral - anchor with unna Elevate legs to the level of the heart and pump ankles as often as possible Compression Pump: Use compression pump on left lower extremity for 60 minutes, twice daily. Compression Pump: Use compression pump on right lower extremity for 60 minutes, twice daily. 1. My suggestion at this time is good to be that we go ahead and initiate treatment with the 4 layer compression wrap along with the silver alginate dressing which in the past has done very well for him. 2. I would recommend as well that we go ahead and have him continue to try to elevate his legs much as possible. With that being said he is going to require I think a hospital bed in order to be able to elevate his legs at night due to how he has to sleep with his breathing. Obviously this may be the only way that we can get things under control. 3. I still recommend that he continue to use his lymphedema pumps I think they are beneficial but again even with pumping and doing everything else I still believe that him sleeping in the manner he has right now is going  to continue to cause some issues. We will see patient back for reevaluation in 1 week here in the clinic. If anything worsens or changes patient will contact our office for additional recommendations. Electronic Signature(s) Signed: 04/11/2019 4:07:44 PM By: Worthy Keeler PA-C Entered By: Worthy Keeler on 04/11/2019 16:07:44 [name] ([id number]) -------------------------------------------------------------------------------- ROS/PFSH Details Patient Name: [name] Date of Service: 04/11/2019 3:00 PM Medical Record Number: [id number] Patient Account Number: [id number] Date of Birth/Sex: 07-02-1950 (68 y.o.  M) Treating RN: Army Melia Primary Care Provider: SYSTEM, PCP Other Clinician: Referring Provider: Referral, Self Treating Provider/Extender: [name] III, Kenyona Rena Weeks in Treatment: 21 Information Obtained From Patient Constitutional Symptoms (General Health) Complaints and Symptoms: Negative for: Fatigue; Fever; Chills; Marked Weight Change Respiratory Complaints and Symptoms: Negative for: Chronic or frequent coughs; Shortness of Breath Cardiovascular Complaints and Symptoms: Positive for: LE edema Negative for: Chest pain Medical History: Positive for: Hypertension; Peripheral Venous Disease Negative for: Angina; Arrhythmia; Congestive Heart Failure; Coronary Artery Disease; Deep Vein Thrombosis; Hypotension; Myocardial Infarction; Peripheral Arterial Disease; Phlebitis; Vasculitis Psychiatric Complaints and Symptoms: Negative for: Anxiety; Claustrophobia Endocrine Medical History: Positive for: Type II Diabetes Treated with: Oral agents Integumentary (Skin) Medical History: Negative for: History of Burn; History of pressure wounds Neurologic Medical History: Positive for: Neuropathy Negative for: Dementia; Quadriplegia; Paraplegia; Seizure Disorder Oncologic Medical History: Positive for: Received Chemotherapy [name], [name] ([id number]) Negative for:  Received Radiation Past Medical History Notes: 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 Immunizations Pneumococcal Vaccine: Received Pneumococcal Vaccination: Yes Immunization Notes: up to date Implantable Devices None Family and Social History Cancer: Yes - Mother; Diabetes: No; Heart Disease: No; Hereditary Spherocytosis: No; Hypertension: No; Kidney Disease: No; Lung Disease: No; Seizures: No; Stroke: Yes - Father; Thyroid Problems: No; Tuberculosis: No; Former smoker - quit 5 years ago; Marital Status - Divorced; Alcohol Use: Never; Drug Use: No History; Caffeine Use: Daily; Financial Concerns: No; Food, Clothing or Shelter Needs: No; Support System Lacking: No; Transportation Concerns: No Physician Affirmation I have reviewed and agree with the above information. Electronic Signature(s) Signed: 04/11/2019 4:43:12 PM By: Army Melia Signed: 04/11/2019 5:16:54 PM By: Worthy Keeler PA-C Entered By: Worthy Keeler on 04/11/2019 16:05:37 [name] ([id number]) -------------------------------------------------------------------------------- SuperBill Details Patient Name: [name] Date of Service: 04/11/2019 Medical Record Number: [id number] Patient Account Number: [id number] Date of Birth/Sex: [birth year] (68 y.o. M) Treating RN: Army Melia Primary Care Provider: SYSTEM, PCP Other Clinician: Referring Provider: Referral, Self Treating Provider/Extender: [name] III, Wylene Weissman Weeks in Treatment: 21 Diagnosis Coding ICD-10 Codes Code Description E11.622 Type 2 diabetes mellitus with other skin ulcer I89.0 Lymphedema, not elsewhere classified I87.2 Venous insufficiency (chronic) (peripheral) L97.822 Non-pressure chronic ulcer of other part of left lower leg with fat layer exposed I10 Essential (primary) hypertension Facility Procedures CPT4: Description Modifier Quantity Code [id number] - WOUND CARE VISIT-LEV 4 EST PT 1 CPT4: [id number] BILATERAL: Application of multi-layer  venous compression system; leg (below 1 knee), including ankle and foot. Physician Procedures CPT4 Code Description: [id number] - WC PHYS LEVEL 4 - EST PT ICD-10 Diagnosis Description E11.622 Type 2 diabetes mellitus with other skin ulcer I89.0 Lymphedema, not elsewhere classified I87.2 Venous insufficiency (chronic) (peripheral) L97.822  Non-pressure chronic ulcer of other part of left lower leg wit Modifier: h fat layer expos Quantity: 1 ed Electronic Signature(s) Signed: 04/11/2019 4:07:54 PM By: Worthy Keeler PA-C Entered By: Worthy Keeler on 04/11/2019 16:07:54

## 2019-04-16 ENCOUNTER — Ambulatory Visit: Payer: PPO | Admitting: Physician Assistant

## 2019-04-18 ENCOUNTER — Other Ambulatory Visit: Payer: Self-pay

## 2019-04-18 ENCOUNTER — Ambulatory Visit (INDEPENDENT_AMBULATORY_CARE_PROVIDER_SITE_OTHER): Payer: PPO | Admitting: Nurse Practitioner

## 2019-04-18 VITALS — BP 127/75 | HR 53 | Resp 14 | Ht 73.0 in | Wt 345.0 lb

## 2019-04-18 DIAGNOSIS — L97909 Non-pressure chronic ulcer of unspecified part of unspecified lower leg with unspecified severity: Secondary | ICD-10-CM | POA: Diagnosis not present

## 2019-04-18 DIAGNOSIS — I83009 Varicose veins of unspecified lower extremity with ulcer of unspecified site: Secondary | ICD-10-CM | POA: Diagnosis not present

## 2019-04-18 NOTE — Progress Notes (Signed)
History of Present Illness  There is no documented history at this time  Assessments & Plan   There are no diagnoses linked to this encounter.    Additional instructions  Subjective:  Patient presents with venous ulcer of the Bilateral lower extremity.    Procedure:  3 layer unna wrap was placed Bilateral lower extremity.   Plan:   Follow up in one week.  

## 2019-04-22 ENCOUNTER — Encounter (INDEPENDENT_AMBULATORY_CARE_PROVIDER_SITE_OTHER): Payer: Self-pay | Admitting: Nurse Practitioner

## 2019-04-25 ENCOUNTER — Encounter (INDEPENDENT_AMBULATORY_CARE_PROVIDER_SITE_OTHER): Payer: Self-pay | Admitting: Nurse Practitioner

## 2019-04-25 ENCOUNTER — Other Ambulatory Visit: Payer: Self-pay

## 2019-04-25 ENCOUNTER — Ambulatory Visit (INDEPENDENT_AMBULATORY_CARE_PROVIDER_SITE_OTHER): Payer: PPO | Admitting: Nurse Practitioner

## 2019-04-25 VITALS — BP 142/72 | HR 54 | Resp 16 | Wt 341.2 lb

## 2019-04-25 DIAGNOSIS — L97909 Non-pressure chronic ulcer of unspecified part of unspecified lower leg with unspecified severity: Secondary | ICD-10-CM

## 2019-04-25 DIAGNOSIS — I83009 Varicose veins of unspecified lower extremity with ulcer of unspecified site: Secondary | ICD-10-CM

## 2019-04-25 NOTE — Progress Notes (Signed)
History of Present Illness  There is no documented history at this time  Assessments & Plan   There are no diagnoses linked to this encounter.    Additional instructions  Subjective:  Patient presents with venous ulcer of the Bilateral lower extremity.    Procedure:  3 layer unna wrap was placed Bilateral lower extremity.   Plan:   Follow up in one week.  

## 2019-05-01 ENCOUNTER — Other Ambulatory Visit: Payer: Self-pay

## 2019-05-01 ENCOUNTER — Encounter (INDEPENDENT_AMBULATORY_CARE_PROVIDER_SITE_OTHER): Payer: Self-pay | Admitting: Nurse Practitioner

## 2019-05-01 ENCOUNTER — Ambulatory Visit (INDEPENDENT_AMBULATORY_CARE_PROVIDER_SITE_OTHER): Payer: PPO | Admitting: Nurse Practitioner

## 2019-05-01 VITALS — BP 149/70 | HR 78 | Resp 16 | Wt 342.0 lb

## 2019-05-01 DIAGNOSIS — I83009 Varicose veins of unspecified lower extremity with ulcer of unspecified site: Secondary | ICD-10-CM

## 2019-05-01 DIAGNOSIS — I10 Essential (primary) hypertension: Secondary | ICD-10-CM

## 2019-05-01 DIAGNOSIS — E11620 Type 2 diabetes mellitus with diabetic dermatitis: Secondary | ICD-10-CM

## 2019-05-01 DIAGNOSIS — L97909 Non-pressure chronic ulcer of unspecified part of unspecified lower leg with unspecified severity: Secondary | ICD-10-CM

## 2019-05-06 ENCOUNTER — Encounter (INDEPENDENT_AMBULATORY_CARE_PROVIDER_SITE_OTHER): Payer: Self-pay | Admitting: Nurse Practitioner

## 2019-05-06 NOTE — Progress Notes (Signed)
SUBJECTIVE:  Patient ID: [name], male    DOB: [birth year], 68 y.o.   MRN: [id number] Chief Complaint  Patient presents with  . Follow-up    unna check    HPI  [name] is a 68 y.o. male presents today for Unna wrap check.  Last time the patient was seen he had blisters along the anterior portion of his calf, these areas look much better however he now has new blisters in the posterior portion of his calf.  The patient also states that he has extensive weeping of his lower extremities to the point where his Unna wraps get wet and he has fluid collection in his shoe.  He denies any fever, chills, nausea, vomiting or diarrhea.  He denies any chest pain or shortness of breath.  Other than this the patient has been tolerating the Unna wraps well.  Past Medical History:  Diagnosis Date  . Diabetes mellitus without complication ([place])   . Hyperlipidemia   . Hypertension   . Leukemia [facility])     Past Surgical History:  Procedure Laterality Date  . CATARACT EXTRACTION Right   . TONSILLECTOMY    . TOOTH EXTRACTION      Social History   Socioeconomic History  . Marital status: Divorced    Spouse name: Not on file  . Number of children: Not on file  . Years of education: Not on file  . Highest education level: Not on file  Occupational History  . Not on file  Tobacco Use  . Smoking status: Former Research scientist (life sciences)  . Smokeless tobacco: Never Used  Substance and Sexual Activity  . Alcohol use: No  . Drug use: No  . Sexual activity: Not on file  Other Topics Concern  . Not on file  Social History Narrative  . Not on file   Social Determinants of Health   Financial Resource Strain:   . Difficulty of Paying Living Expenses: Not on file  Food Insecurity:   . Worried About Charity fundraiser in the Last Year: Not on file  . Ran Out of Food in the Last Year: Not on file  Transportation Needs:   . Lack of Transportation (Medical): Not on file  . Lack of Transportation  (Non-Medical): Not on file  Physical Activity:   . Days of Exercise per Week: Not on file  . Minutes of Exercise per Session: Not on file  Stress:   . Feeling of Stress : Not on file  Social Connections:   . Frequency of Communication with Friends and Family: Not on file  . Frequency of Social Gatherings with Friends and Family: Not on file  . Attends Religious Services: Not on file  . Active Member of Clubs or Organizations: Not on file  . Attends Archivist Meetings: Not on file  . Marital Status: Not on file  Intimate Partner Violence:   . Fear of Current or Ex-Partner: Not on file  . Emotionally Abused: Not on file  . Physically Abused: Not on file  . Sexually Abused: Not on file    Family History  Problem Relation Age of Onset  . Cancer Mother   . Stroke Father   . Cancer Sister     No Known Allergies   Review of Systems   Review of Systems: Negative Unless Checked Constitutional: [] Weight loss  [] Fever  [] Chills Cardiac: [] Chest pain   []  Atrial Fibrillation  [] Palpitations   [] Shortness of breath when laying flat   []   Shortness of breath with exertion. [] Shortness of breath at rest Vascular:  [] Pain in legs with walking   [] Pain in legs with standing [] Pain in legs when laying flat   [] Claudication    [] Pain in feet when laying flat    [] History of DVT   [] Phlebitis   [x] Swelling in legs   [] Varicose veins   [] Non-healing ulcers Pulmonary:   [] Uses home oxygen   [] Productive cough   [] Hemoptysis   [] Wheeze  [] COPD   [] Asthma Neurologic:  [] Dizziness   [] Seizures  [] Blackouts [] History of stroke   [] History of TIA  [] Aphasia   [] Temporary Blindness   [] Weakness or numbness in arm   [] Weakness or numbness in leg Musculoskeletal:   [] Joint swelling   [] Joint pain   [] Low back pain  []  History of Knee Replacement [] Arthritis [] back Surgeries  []  Spinal Stenosis    Hematologic:  [] Easy bruising  [] Easy bleeding   [] Hypercoagulable state   [] Anemic Gastrointestinal:   [] Diarrhea   [] Vomiting  [] Gastroesophageal reflux/heartburn   [] Difficulty swallowing. [] Abdominal pain Genitourinary:  [] Chronic kidney disease   [] Difficult urination  [] Anuric   [] Blood in urine [] Frequent urination  [] Burning with urination   [] Hematuria Skin:  [] Rashes   [x] Ulcers [] Wounds Psychological:  [] History of anxiety   []  History of major depression  []  Memory Difficulties      OBJECTIVE:   Physical Exam  BP (!) 149/70 (BP Location: Right Arm)   Pulse 78   Resp 16   Wt (!) 342 lb (155.1 kg)   BMI 45.12 kg/m   Gen: WD/WN, NAD Head: [place]/AT, No temporalis wasting.  Ear/Nose/Throat: Hearing grossly intact, nares w/o erythema or drainage Eyes: PER, EOMI, sclera nonicteric.  Neck: Supple, no masses.  No JVD.  Pulmonary:  Good air movement, no use of accessory muscles.  Cardiac: RRR Vascular: 4+ pitting edema bilaterally with severe venous changes bilaterally. Some fluid filled blisters with weeping  Vessel Right Left  Dorsalis Pedis Not Palpable Not Palpable  Posterior Tibial Not Palpable Not Palpable   Gastrointestinal: soft, non-distended. No guarding/no peritoneal signs.  Musculoskeletal: M/S 5/5 throughout.  No deformity or atrophy.  Neurologic: Pain and light touch intact in extremities.  Symmetrical.  Speech is fluent. Motor exam as listed above. Psychiatric: Judgment intact, Mood & affect appropriate for pt's clinical situation. Dermatologic: No Venous rashes. No Ulcers Noted.  No changes consistent with cellulitis. Lymph : No Cervical lymphadenopathy, no lichenification or skin changes of chronic lymphedema.       ASSESSMENT AND PLAN:  1. Venous ulcer ([place]) No surgery or intervention at this point in time.    I have had a long discussion with the patient regarding venous insufficiency and why it  causes symptoms, specifically venous ulceration . I have discussed with the patient the chronic skin changes that accompany venous insufficiency and the long term  sequela such as infection and recurring  ulceration.  Patient will be placed in [employer] which will be changed weekly drainage permitting.  Patient is specifically instructed that if he begins to weep through his Unna wraps he should contact her office to have them changed as leading wet wraps around his skin could be more detrimental to his wound healing.  In addition, behavioral modification including several periods of elevation of the lower extremities during the day will be continued. Achieving a position with the ankles at heart level was stressed to the patient  The patient is instructed to begin routine exercise, especially walking on  a daily basis  The patient will be evaluated in 4 weeks to see the progression with Unna wraps.  The patient continues to have lower extremity edema as well as blistering we may also want to consider a cardiology consult to rule out possible cardiac causes.   2. Essential hypertension Continue antihypertensive medications as already ordered, these medications have been reviewed and there are no changes at this time.   3. Type 2 diabetes mellitus with diabetic dermatitis, without long-term current use of insulin ([place]) Continue hypoglycemic medications as already ordered, these medications have been reviewed and there are no changes at this time.  Hgb A1C to be monitored as already arranged by primary service    Current Outpatient Medications on File Prior to Visit  Medication Sig Dispense Refill  . bisoprolol-hydrochlorothiazide (ZIAC) 10-6.25 MG tablet Take 1 tablet by mouth daily.    . furosemide (LASIX) 20 MG tablet Take 20 mg by mouth.    Marland Kitchen glimepiride (AMARYL) 4 MG tablet     . hydrALAZINE (APRESOLINE) 10 MG tablet Take 10 mg by mouth 3 (three) times daily.    Marland Kitchen lisinopril-hydrochlorothiazide (PRINZIDE,ZESTORETIC) 20-12.5 MG tablet Take 1 tablet by mouth daily.    . metFORMIN (GLUCOPHAGE) 1000 MG tablet Take 1,000 mg by mouth 2 (two) times daily  with a meal.    . pioglitazone (ACTOS) 15 MG tablet Take 15 mg by mouth daily.    . pravastatin (PRAVACHOL) 80 MG tablet Take 80 mg by mouth daily.     No current facility-administered medications on file prior to visit.    There are no Patient Instructions on file for this visit. No follow-ups on file.   Kris Hartmann, NP  This note was completed with Sales executive.  Any errors are purely unintentional.

## 2019-05-08 ENCOUNTER — Other Ambulatory Visit: Payer: Self-pay

## 2019-05-08 ENCOUNTER — Encounter (INDEPENDENT_AMBULATORY_CARE_PROVIDER_SITE_OTHER): Payer: Self-pay | Admitting: Nurse Practitioner

## 2019-05-08 ENCOUNTER — Ambulatory Visit (INDEPENDENT_AMBULATORY_CARE_PROVIDER_SITE_OTHER): Payer: PPO | Admitting: Nurse Practitioner

## 2019-05-08 VITALS — BP 131/64 | HR 61 | Resp 16 | Wt 348.0 lb

## 2019-05-08 DIAGNOSIS — I83009 Varicose veins of unspecified lower extremity with ulcer of unspecified site: Secondary | ICD-10-CM | POA: Diagnosis not present

## 2019-05-08 DIAGNOSIS — L97909 Non-pressure chronic ulcer of unspecified part of unspecified lower leg with unspecified severity: Secondary | ICD-10-CM

## 2019-05-08 DIAGNOSIS — R600 Localized edema: Secondary | ICD-10-CM

## 2019-05-08 NOTE — Progress Notes (Signed)
History of Present Illness  There is no documented history at this time  Assessments & Plan   There are no diagnoses linked to this encounter.    Additional instructions  Subjective:  Patient presents with venous ulcer of the Bilateral lower extremity.    Procedure:  3 layer unna wrap was placed Bilateral lower extremity.   Plan:   Follow up in one week.  

## 2019-05-13 ENCOUNTER — Encounter (INDEPENDENT_AMBULATORY_CARE_PROVIDER_SITE_OTHER): Payer: Self-pay | Admitting: Nurse Practitioner

## 2019-05-13 ENCOUNTER — Other Ambulatory Visit: Payer: Self-pay

## 2019-05-13 ENCOUNTER — Ambulatory Visit (INDEPENDENT_AMBULATORY_CARE_PROVIDER_SITE_OTHER): Payer: PPO | Admitting: Nurse Practitioner

## 2019-05-13 VITALS — BP 125/69 | HR 77 | Resp 17 | Ht 73.0 in | Wt 347.0 lb

## 2019-05-13 DIAGNOSIS — L97909 Non-pressure chronic ulcer of unspecified part of unspecified lower leg with unspecified severity: Secondary | ICD-10-CM

## 2019-05-13 DIAGNOSIS — I83009 Varicose veins of unspecified lower extremity with ulcer of unspecified site: Secondary | ICD-10-CM

## 2019-05-13 NOTE — Progress Notes (Signed)
History of Present Illness  There is no documented history at this time  Assessments & Plan   There are no diagnoses linked to this encounter.    Additional instructions  Subjective:  Patient presents with venous ulcer of the Bilateral lower extremity.    Procedure:  3 layer unna wrap was placed Bilateral lower extremity.   Plan:   Follow up in one week.  

## 2019-05-16 ENCOUNTER — Other Ambulatory Visit: Payer: Self-pay

## 2019-05-16 ENCOUNTER — Ambulatory Visit (INDEPENDENT_AMBULATORY_CARE_PROVIDER_SITE_OTHER): Payer: PPO | Admitting: Nurse Practitioner

## 2019-05-16 VITALS — BP 127/69 | HR 72 | Wt 348.0 lb

## 2019-05-16 DIAGNOSIS — L97909 Non-pressure chronic ulcer of unspecified part of unspecified lower leg with unspecified severity: Secondary | ICD-10-CM

## 2019-05-16 DIAGNOSIS — I83009 Varicose veins of unspecified lower extremity with ulcer of unspecified site: Secondary | ICD-10-CM | POA: Diagnosis not present

## 2019-05-16 NOTE — Progress Notes (Signed)
History of Present Illness  There is no documented history at this time  Assessments & Plan   There are no diagnoses linked to this encounter.    Additional instructions  Subjective:  Patient presents with venous ulcer of the Bilateral lower extremity.    Procedure:  3 layer unna wrap was placed Bilateral lower extremity.   Plan:   Follow up in one week.  

## 2019-05-17 ENCOUNTER — Encounter (INDEPENDENT_AMBULATORY_CARE_PROVIDER_SITE_OTHER): Payer: Self-pay | Admitting: Nurse Practitioner

## 2019-05-20 ENCOUNTER — Other Ambulatory Visit: Payer: Self-pay

## 2019-05-20 ENCOUNTER — Encounter (INDEPENDENT_AMBULATORY_CARE_PROVIDER_SITE_OTHER): Payer: Self-pay | Admitting: Nurse Practitioner

## 2019-05-20 ENCOUNTER — Ambulatory Visit (INDEPENDENT_AMBULATORY_CARE_PROVIDER_SITE_OTHER): Payer: PPO | Admitting: Nurse Practitioner

## 2019-05-20 VITALS — BP 124/70 | HR 59 | Resp 16 | Ht 73.0 in | Wt 350.0 lb

## 2019-05-20 DIAGNOSIS — L97909 Non-pressure chronic ulcer of unspecified part of unspecified lower leg with unspecified severity: Secondary | ICD-10-CM

## 2019-05-20 DIAGNOSIS — I83009 Varicose veins of unspecified lower extremity with ulcer of unspecified site: Secondary | ICD-10-CM | POA: Diagnosis not present

## 2019-05-20 NOTE — Progress Notes (Signed)
History of Present Illness  There is no documented history at this time  Assessments & Plan   There are no diagnoses linked to this encounter.    Additional instructions  Subjective:  Patient presents with venous ulcer of the Bilateral lower extremity.    Procedure:  3 layer unna wrap was placed Bilateral lower extremity.   Plan:   Follow up in one week.  

## 2019-05-23 ENCOUNTER — Ambulatory Visit (INDEPENDENT_AMBULATORY_CARE_PROVIDER_SITE_OTHER): Payer: PPO | Admitting: Nurse Practitioner

## 2019-05-23 ENCOUNTER — Other Ambulatory Visit: Payer: Self-pay

## 2019-05-23 ENCOUNTER — Encounter (INDEPENDENT_AMBULATORY_CARE_PROVIDER_SITE_OTHER): Payer: Self-pay

## 2019-05-23 VITALS — BP 113/72 | HR 72 | Resp 16 | Wt 349.0 lb

## 2019-05-23 DIAGNOSIS — I83009 Varicose veins of unspecified lower extremity with ulcer of unspecified site: Secondary | ICD-10-CM

## 2019-05-23 DIAGNOSIS — L97909 Non-pressure chronic ulcer of unspecified part of unspecified lower leg with unspecified severity: Secondary | ICD-10-CM

## 2019-05-23 NOTE — Progress Notes (Signed)
History of Present Illness  There is no documented history at this time  Assessments & Plan   There are no diagnoses linked to this encounter.    Additional instructions  Subjective:  Patient presents with venous ulcer of the Bilateral lower extremity.    Procedure:  3 layer unna wrap was placed Bilateral lower extremity.   Plan:   Follow up in one week.  

## 2019-05-24 ENCOUNTER — Ambulatory Visit: Payer: PPO | Admitting: Cardiology

## 2019-05-27 ENCOUNTER — Encounter (INDEPENDENT_AMBULATORY_CARE_PROVIDER_SITE_OTHER): Payer: PPO

## 2019-05-30 ENCOUNTER — Encounter (INDEPENDENT_AMBULATORY_CARE_PROVIDER_SITE_OTHER): Payer: Self-pay | Admitting: Vascular Surgery

## 2019-05-30 ENCOUNTER — Ambulatory Visit (INDEPENDENT_AMBULATORY_CARE_PROVIDER_SITE_OTHER): Payer: PPO | Admitting: Vascular Surgery

## 2019-05-30 ENCOUNTER — Other Ambulatory Visit: Payer: Self-pay

## 2019-05-30 VITALS — BP 132/71 | HR 80 | Resp 19 | Wt 350.0 lb

## 2019-05-30 DIAGNOSIS — I890 Lymphedema, not elsewhere classified: Secondary | ICD-10-CM | POA: Diagnosis not present

## 2019-05-30 DIAGNOSIS — I83009 Varicose veins of unspecified lower extremity with ulcer of unspecified site: Secondary | ICD-10-CM | POA: Diagnosis not present

## 2019-05-30 DIAGNOSIS — E782 Mixed hyperlipidemia: Secondary | ICD-10-CM | POA: Diagnosis not present

## 2019-05-30 DIAGNOSIS — L97909 Non-pressure chronic ulcer of unspecified part of unspecified lower leg with unspecified severity: Secondary | ICD-10-CM | POA: Diagnosis not present

## 2019-05-30 DIAGNOSIS — I872 Venous insufficiency (chronic) (peripheral): Secondary | ICD-10-CM

## 2019-05-30 DIAGNOSIS — I10 Essential (primary) hypertension: Secondary | ICD-10-CM | POA: Diagnosis not present

## 2019-05-30 NOTE — Progress Notes (Signed)
MRN : [id number]  [name] is a 69 y.o. (Dec 11, 1950) male who presents with chief complaint of No chief complaint on file. Marland Kitchen  History of Present Illness:   Patient is seen for follow up evaluation of leg pain and swelling associated with venous ulceration. The patient was recently seen here and started on Unna boot therapy.  The swelling abruptly became much worse bilaterally and is associated with pain and discoloration. The pain and swelling worsens with prolonged dependency and improves with elevation.  The patient notes that in the morning the legs are better but the leg symptoms worsened throughout the course of the day. The patient has also noted a progressive worsening of the discoloration in the ankle and shin area.   The patient notes that an ulcer has developed acutely without specific trauma and since it occurred it has been very slow to heal.  There is a moderate amount of drainage associated with the open area.  The wound is also very painful.  The patient notes that they were not able to tolerate the Unna boot and removed it several days ago.  The patient states that they have been elevating as much as possible. The patient denies any recent changes in medications.  The patient denies a history of DVT or PE. There is no prior history of phlebitis. There is no history of primary lymphedema.  No SOB or increased cough.  No sputum production.  No recent episodes of CHF exacerbation.  Previous ABI's are normal bilaterally.  This is consistent with the previous noninvasive study obtained 06/23/2016  No outpatient medications have been marked as taking for the 05/30/19 encounter (Appointment) with Delana Meyer, Dolores Lory, MD.    Past Medical History:  Diagnosis Date  . Diabetes mellitus without complication ([place])   . Hyperlipidemia   . Hypertension   . Leukemia [facility])     Past Surgical History:  Procedure Laterality Date  . CATARACT EXTRACTION Right   . TONSILLECTOMY    .  TOOTH EXTRACTION      Social History Social History   Tobacco Use  . Smoking status: Former Research scientist (life sciences)  . Smokeless tobacco: Never Used  Substance Use Topics  . Alcohol use: No  . Drug use: No    Family History Family History  Problem Relation Age of Onset  . Cancer Mother   . Stroke Father   . Cancer Sister     No Known Allergies   REVIEW OF SYSTEMS (Negative unless checked)  Constitutional: [] Weight loss  [] Fever  [] Chills Cardiac: [] Chest pain   [] Chest pressure   [] Palpitations   [] Shortness of breath when laying flat   [] Shortness of breath with exertion. Vascular:  [] Pain in legs with walking   [] Pain in legs at rest  [] History of DVT   [] Phlebitis   [x] Swelling in legs   [x] Varicose veins   [x] Non-healing ulcers Pulmonary:   [] Uses home oxygen   [] Productive cough   [] Hemoptysis   [] Wheeze  [] COPD   [] Asthma Neurologic:  [] Dizziness   [] Seizures   [] History of stroke   [] History of TIA  [] Aphasia   [] Vissual changes   [] Weakness or numbness in arm   [] Weakness or numbness in leg Musculoskeletal:   [] Joint swelling   [] Joint pain   [] Low back pain Hematologic:  [] Easy bruising  [] Easy bleeding   [] Hypercoagulable state   [] Anemic Gastrointestinal:  [] Diarrhea   [] Vomiting  [] Gastroesophageal reflux/heartburn   [] Difficulty swallowing. Genitourinary:  [] Chronic kidney disease   []   Difficult urination  [] Frequent urination   [] Blood in urine Skin:  [x] Rashes   [x] Ulcers  Psychological:  [] History of anxiety   []  History of major depression.  Physical Examination  There were no vitals filed for this visit. There is no height or weight on file to calculate BMI. Gen: WD/WN, NAD Head: [place]/AT, No temporalis wasting.  Ear/Nose/Throat: Hearing grossly intact, nares w/o erythema or drainage Eyes: PER, EOMI, sclera nonicteric.  Neck: Supple, no large masses.   Pulmonary:  Good air movement, no audible wheezing bilaterally, no use of accessory muscles.  Cardiac: RRR, no JVD  Vascular: 2-3+ edema bilaterally with severe venous changes bilaterally.  Venous ulcers noted in the ankle area bilaterally, noninfected, left worse than right.  No evidence of cellulitis Gastrointestinal: Non-distended. No guarding/no peritoneal signs.  Musculoskeletal: M/S 5/5 throughout.  No deformity or atrophy.  Neurologic: CN 2-12 intact. Symmetrical.  Speech is fluent. Motor exam as listed above. Psychiatric: Judgment intact, Mood & affect appropriate for pt's clinical situation. Dermatologic: Severe venous stasis dermatitis with ulcers present.  No changes consistent with cellulitis. Lymph : No lichenification or skin changes of chronic lymphedema.  CBC No results found for: WBC, HGB, HCT, MCV, PLT  BMET No results found for: NA, K, CL, CO2, GLUCOSE, BUN, CREATININE, CALCIUM, GFRNONAA, GFRAA CrCl cannot be calculated (No successful lab value found.).  COAG No results found for: INR, PROTIME  Radiology No results found.    Assessment/Plan 1. Venous ulcer ([place]) No surgery or intervention at this point in time.    I have had a long discussion with the patient regarding venous insufficiency and why it  causes symptoms, specifically venous ulceration . I have discussed with the patient the chronic skin changes that accompany venous insufficiency and the long term sequela such as infection and recurring  ulceration.  Patient will be placed in bilateral Unna Boots which will be changed weekly drainage permitting.  In addition, behavioral modification including several periods of elevation of the lower extremities during the day will be continued. Achieving a position with the ankles at heart level was stressed to the patient  The patient is instructed to begin routine exercise, especially walking on a daily basis  Continue lymph pump daily.  Following the review of the ultrasound the patient will follow up in one week to reassess the degree of swelling and the control that Unna  therapy is offering.   The patient can be assessed for graduated compression stockings or wraps as well as a Lymph Pump once the ulcers are healed.   2. Chronic venous insufficiency  No surgery or intervention at this point in time.    I have reviewed my discussion with the patient regarding lymphedema and why it  causes symptoms.  Patient will continue wearing graduated compression stockings class 1 (20-30 mmHg) on a daily basis a prescription was given. The patient is reminded to put the stockings on first thing in the morning and removing them in the evening. The patient is instructed specifically not to sleep in the stockings.   In addition, behavioral modification throughout the day will be continued.  This will include frequent elevation (such as in a recliner), use of over the counter pain medications as needed and exercise such as walking.  I have reviewed systemic causes for chronic edema such as liver, kidney and cardiac etiologies and there does not appear to be any significant changes in these organ systems over the past year.  The patient is under  the impression that these organ systems are all stable and unchanged.    The patient will continue aggressive use of the  lymph pump.  This will continue to improve the edema control and prevent sequela such as ulcers and infections.   The patient will follow-up with me on an annual basis.    3. Lymphedema  No surgery or intervention at this point in time.    I have reviewed my discussion with the patient regarding lymphedema and why it  causes symptoms.  Patient will continue wearing graduated compression stockings class 1 (20-30 mmHg) on a daily basis a prescription was given. The patient is reminded to put the stockings on first thing in the morning and removing them in the evening. The patient is instructed specifically not to sleep in the stockings.   In addition, behavioral modification throughout the day will be continued.  This  will include frequent elevation (such as in a recliner), use of over the counter pain medications as needed and exercise such as walking.  I have reviewed systemic causes for chronic edema such as liver, kidney and cardiac etiologies and there does not appear to be any significant changes in these organ systems over the past year.  The patient is under the impression that these organ systems are all stable and unchanged.    The patient will continue aggressive use of the  lymph pump.  This will continue to improve the edema control and prevent sequela such as ulcers and infections.   The patient will follow-up with me on an annual basis.    4. Essential hypertension Continue antihypertensive medications as already ordered, these medications have been reviewed and there are no changes at this time.   5. Mixed hyperlipidemia Continue statin as ordered and reviewed, no changes at this time     Hortencia Pilar, MD  05/30/2019 8:52 AM

## 2019-06-06 ENCOUNTER — Ambulatory Visit (INDEPENDENT_AMBULATORY_CARE_PROVIDER_SITE_OTHER): Payer: PPO | Admitting: Nurse Practitioner

## 2019-06-06 ENCOUNTER — Other Ambulatory Visit: Payer: Self-pay

## 2019-06-06 ENCOUNTER — Encounter (INDEPENDENT_AMBULATORY_CARE_PROVIDER_SITE_OTHER): Payer: Self-pay

## 2019-06-06 VITALS — BP 144/73 | HR 67 | Resp 16 | Wt 352.0 lb

## 2019-06-06 DIAGNOSIS — I83009 Varicose veins of unspecified lower extremity with ulcer of unspecified site: Secondary | ICD-10-CM | POA: Diagnosis not present

## 2019-06-06 DIAGNOSIS — L97909 Non-pressure chronic ulcer of unspecified part of unspecified lower leg with unspecified severity: Secondary | ICD-10-CM | POA: Diagnosis not present

## 2019-06-06 NOTE — Progress Notes (Signed)
History of Present Illness  There is no documented history at this time  Assessments & Plan   There are no diagnoses linked to this encounter.    Additional instructions  Subjective:  Patient presents with venous ulcer of the Bilateral lower extremity.    Procedure:  3 layer unna wrap was placed Bilateral lower extremity.   Plan:   Follow up in one week.  

## 2019-06-10 ENCOUNTER — Encounter (INDEPENDENT_AMBULATORY_CARE_PROVIDER_SITE_OTHER): Payer: Self-pay | Admitting: Nurse Practitioner

## 2019-06-13 ENCOUNTER — Other Ambulatory Visit: Payer: Self-pay

## 2019-06-13 ENCOUNTER — Encounter (INDEPENDENT_AMBULATORY_CARE_PROVIDER_SITE_OTHER): Payer: Self-pay

## 2019-06-13 ENCOUNTER — Ambulatory Visit (INDEPENDENT_AMBULATORY_CARE_PROVIDER_SITE_OTHER): Payer: PPO | Admitting: Nurse Practitioner

## 2019-06-13 VITALS — BP 129/67 | HR 63 | Resp 16 | Wt 353.0 lb

## 2019-06-13 DIAGNOSIS — L97909 Non-pressure chronic ulcer of unspecified part of unspecified lower leg with unspecified severity: Secondary | ICD-10-CM

## 2019-06-13 DIAGNOSIS — I83009 Varicose veins of unspecified lower extremity with ulcer of unspecified site: Secondary | ICD-10-CM | POA: Diagnosis not present

## 2019-06-13 NOTE — Progress Notes (Signed)
History of Present Illness  There is no documented history at this time  Assessments & Plan   There are no diagnoses linked to this encounter.    Additional instructions  Subjective:  Patient presents with venous ulcer of the Bilateral lower extremity.    Procedure:  3 layer unna wrap was placed Bilateral lower extremity.   Plan:   Follow up in one week.  

## 2019-06-14 ENCOUNTER — Encounter (INDEPENDENT_AMBULATORY_CARE_PROVIDER_SITE_OTHER): Payer: Self-pay | Admitting: Nurse Practitioner

## 2019-06-20 ENCOUNTER — Ambulatory Visit (INDEPENDENT_AMBULATORY_CARE_PROVIDER_SITE_OTHER): Payer: PPO | Admitting: Nurse Practitioner

## 2019-06-20 ENCOUNTER — Other Ambulatory Visit: Payer: Self-pay

## 2019-06-20 ENCOUNTER — Encounter (INDEPENDENT_AMBULATORY_CARE_PROVIDER_SITE_OTHER): Payer: Self-pay

## 2019-06-20 VITALS — BP 145/71 | HR 61 | Resp 16 | Wt 354.0 lb

## 2019-06-20 DIAGNOSIS — I890 Lymphedema, not elsewhere classified: Secondary | ICD-10-CM | POA: Diagnosis not present

## 2019-06-20 NOTE — Progress Notes (Signed)
History of Present Illness  There is no documented history at this time  Assessments & Plan   There are no diagnoses linked to this encounter.    Additional instructions  Subjective:  Patient presents with venous ulcer of the Bilateral lower extremity.    Procedure:  3 layer unna wrap was placed Bilateral lower extremity.   Plan:   Follow up in one week.  

## 2019-06-27 ENCOUNTER — Ambulatory Visit (INDEPENDENT_AMBULATORY_CARE_PROVIDER_SITE_OTHER): Payer: PPO | Admitting: Nurse Practitioner

## 2019-06-28 ENCOUNTER — Other Ambulatory Visit: Payer: Self-pay

## 2019-06-28 ENCOUNTER — Encounter (INDEPENDENT_AMBULATORY_CARE_PROVIDER_SITE_OTHER): Payer: Self-pay | Admitting: Nurse Practitioner

## 2019-06-28 ENCOUNTER — Ambulatory Visit (INDEPENDENT_AMBULATORY_CARE_PROVIDER_SITE_OTHER): Payer: PPO | Admitting: Nurse Practitioner

## 2019-06-28 VITALS — BP 125/75 | HR 62 | Resp 16 | Wt 352.0 lb

## 2019-06-28 DIAGNOSIS — I83009 Varicose veins of unspecified lower extremity with ulcer of unspecified site: Secondary | ICD-10-CM | POA: Diagnosis not present

## 2019-06-28 DIAGNOSIS — L97909 Non-pressure chronic ulcer of unspecified part of unspecified lower leg with unspecified severity: Secondary | ICD-10-CM

## 2019-06-28 DIAGNOSIS — I10 Essential (primary) hypertension: Secondary | ICD-10-CM | POA: Diagnosis not present

## 2019-06-28 DIAGNOSIS — I890 Lymphedema, not elsewhere classified: Secondary | ICD-10-CM

## 2019-06-28 NOTE — Progress Notes (Signed)
SUBJECTIVE:  Patient ID: [name], male    DOB: 1950-11-06, 69 y.o.   MRN: [id number] Chief Complaint  Patient presents with  . Follow-up    unna check    HPI  [name] is a 69 y.o. male follows up today for evaluation of his bilateral lower extremity ulcerations that he has been in [place] wraps for.  The patient has mostly healed his ulcerations at this time there are few small shallow ones left.  The patient does continue to have some extensive edema in his feet.  The patient does endorse that he is not able to elevate his much due to the fact that he has knee pain when he does elevate.  The patient also endorses he does not use his lymphedema pump as when he used it previously he did not see much relief or change.  The patient also reports that the weeping that he had had stopped.  He denies any fever, chills, nausea, vomiting or diarrhea.  Past Medical History:  Diagnosis Date  . Diabetes mellitus without complication ([place])   . Hyperlipidemia   . Hypertension   . Leukemia [facility])     Past Surgical History:  Procedure Laterality Date  . CATARACT EXTRACTION Right   . TONSILLECTOMY    . TOOTH EXTRACTION      Social History   Socioeconomic History  . Marital status: Divorced    Spouse name: Not on file  . Number of children: Not on file  . Years of education: Not on file  . Highest education level: Not on file  Occupational History  . Not on file  Tobacco Use  . Smoking status: Former Research scientist (life sciences)  . Smokeless tobacco: Never Used  Substance and Sexual Activity  . Alcohol use: No  . Drug use: No  . Sexual activity: Not on file  Other Topics Concern  . Not on file  Social History Narrative  . Not on file   Social Determinants of Health   Financial Resource Strain:   . Difficulty of Paying Living Expenses: Not on file  Food Insecurity:   . Worried About Charity fundraiser in the Last Year: Not on file  . Ran Out of Food in the Last Year: Not on file   Transportation Needs:   . Lack of Transportation (Medical): Not on file  . Lack of Transportation (Non-Medical): Not on file  Physical Activity:   . Days of Exercise per Week: Not on file  . Minutes of Exercise per Session: Not on file  Stress:   . Feeling of Stress : Not on file  Social Connections:   . Frequency of Communication with Friends and Family: Not on file  . Frequency of Social Gatherings with Friends and Family: Not on file  . Attends Religious Services: Not on file  . Active Member of Clubs or Organizations: Not on file  . Attends Archivist Meetings: Not on file  . Marital Status: Not on file  Intimate Partner Violence:   . Fear of Current or Ex-Partner: Not on file  . Emotionally Abused: Not on file  . Physically Abused: Not on file  . Sexually Abused: Not on file    Family History  Problem Relation Age of Onset  . Cancer Mother   . Stroke Father   . Cancer Sister     No Known Allergies   Review of Systems   Review of Systems: Negative Unless Checked Constitutional: [] Weight loss  [] Fever  []   Chills Cardiac: [] Chest pain   []  Atrial Fibrillation  [] Palpitations   [] Shortness of breath when laying flat   [] Shortness of breath with exertion. [] Shortness of breath at rest Vascular:  [] Pain in legs with walking   [] Pain in legs with standing [] Pain in legs when laying flat   [] Claudication    [] Pain in feet when laying flat    [] History of DVT   [] Phlebitis   [x] Swelling in legs   [] Varicose veins   [] Non-healing ulcers Pulmonary:   [] Uses home oxygen   [] Productive cough   [] Hemoptysis   [] Wheeze  [] COPD   [] Asthma Neurologic:  [] Dizziness   [] Seizures  [] Blackouts [] History of stroke   [] History of TIA  [] Aphasia   [] Temporary Blindness   [] Weakness or numbness in arm   [] Weakness or numbness in leg Musculoskeletal:   [] Joint swelling   [] Joint pain   [] Low back pain  []  History of Knee Replacement [] Arthritis [] back Surgeries  []  Spinal Stenosis     Hematologic:  [] Easy bruising  [] Easy bleeding   [] Hypercoagulable state   [] Anemic Gastrointestinal:  [] Diarrhea   [] Vomiting  [] Gastroesophageal reflux/heartburn   [] Difficulty swallowing. [] Abdominal pain Genitourinary:  [] Chronic kidney disease   [] Difficult urination  [] Anuric   [] Blood in urine [] Frequent urination  [] Burning with urination   [] Hematuria Skin:  [] Rashes   [x] Ulcers [] Wounds Psychological:  [] History of anxiety   []  History of major depression  []  Memory Difficulties      OBJECTIVE:   Physical Exam  BP 125/75 (BP Location: Right Arm)   Pulse 62   Resp 16   Wt (!) 352 lb (159.7 kg)   BMI 46.44 kg/m   Gen: WD/WN, NAD Head: /AT, No temporalis wasting.  Ear/Nose/Throat: Hearing grossly intact, nares w/o erythema or drainage Eyes: PER, EOMI, sclera nonicteric.  Neck: Supple, no masses.  No JVD.  Pulmonary:  Good air movement, no use of accessory muscles.  Cardiac: RRR Vascular:  4+ pedal edema, 3+ at extremities Vessel Right Left  Radial Palpable Palpable   Gastrointestinal: soft, non-distended. No guarding/no peritoneal signs.  Musculoskeletal: M/S 5/5 throughout.  No deformity or atrophy.  Neurologic: Pain and light touch intact in extremities.  Symmetrical.  Speech is fluent. Motor exam as listed above. Psychiatric: Judgment intact, Mood & affect appropriate for pt's clinical situation. Dermatologic: Bilateral venous ulcers with mild stasis dermatitis bilaterally no Ulcers Noted.  No changes consistent with cellulitis. Lymph : No Cervical lymphadenopathy, bilateral dermal thickening and lichenification       ASSESSMENT AND PLAN:  1. Lymphedema I discussed with the patient elevating his lower extremities however due to some arthritis issues this is difficult for him.  I discussed elevating as much as he can in addition to reusing his lymphedema pump to help with his swelling.  The patient agrees to start using his lymphedema pump.  He also has medical  grade 1 compression stockings at home that he can transition into once he is hopefully able to come out of the Unna wraps in about about the next 4 weeks or so.  2. Venous ulcer ([place]) Venous ulcerations mostly healed.  The excessive weeping that the patient has is decreased to where he is no longer weeping at this point.  We will continue to do Unna wraps on a weekly basis.  Patient will present to the office in 4 weeks for evaluation.  3. Essential hypertension Continue antihypertensive medications as already ordered, these medications have been reviewed and there are  no changes at this time.    Current Outpatient Medications on File Prior to Visit  Medication Sig Dispense Refill  . bisoprolol-hydrochlorothiazide (ZIAC) 10-6.25 MG tablet Take 1 tablet by mouth daily.    . furosemide (LASIX) 20 MG tablet Take 20 mg by mouth.    Marland Kitchen glimepiride (AMARYL) 4 MG tablet     . hydrALAZINE (APRESOLINE) 10 MG tablet Take 10 mg by mouth 3 (three) times daily.    Marland Kitchen lisinopril-hydrochlorothiazide (PRINZIDE,ZESTORETIC) 20-12.5 MG tablet Take 1 tablet by mouth daily.    . metFORMIN (GLUCOPHAGE) 1000 MG tablet Take 1,000 mg by mouth 2 (two) times daily with a meal.    . pioglitazone (ACTOS) 15 MG tablet Take 15 mg by mouth daily.    . pravastatin (PRAVACHOL) 80 MG tablet Take 80 mg by mouth daily.     No current facility-administered medications on file prior to visit.    There are no Patient Instructions on file for this visit. No follow-ups on file.   Kris Hartmann, NP  This note was completed with Sales executive.  Any errors are purely unintentional.

## 2019-07-04 ENCOUNTER — Other Ambulatory Visit: Payer: Self-pay

## 2019-07-04 ENCOUNTER — Ambulatory Visit (INDEPENDENT_AMBULATORY_CARE_PROVIDER_SITE_OTHER): Payer: PPO | Admitting: Nurse Practitioner

## 2019-07-04 ENCOUNTER — Encounter (INDEPENDENT_AMBULATORY_CARE_PROVIDER_SITE_OTHER): Payer: Self-pay

## 2019-07-04 VITALS — BP 131/70 | HR 76 | Resp 16 | Wt 355.0 lb

## 2019-07-04 DIAGNOSIS — I83009 Varicose veins of unspecified lower extremity with ulcer of unspecified site: Secondary | ICD-10-CM

## 2019-07-04 DIAGNOSIS — L97909 Non-pressure chronic ulcer of unspecified part of unspecified lower leg with unspecified severity: Secondary | ICD-10-CM

## 2019-07-04 NOTE — Progress Notes (Signed)
History of Present Illness  There is no documented history at this time  Assessments & Plan   There are no diagnoses linked to this encounter.    Additional instructions  Subjective:  Patient presents with venous ulcer of the Bilateral lower extremity.    Procedure:  3 layer unna wrap was placed Bilateral lower extremity.   Plan:   Follow up in one week.  

## 2019-07-11 ENCOUNTER — Other Ambulatory Visit: Payer: Self-pay

## 2019-07-11 ENCOUNTER — Encounter (INDEPENDENT_AMBULATORY_CARE_PROVIDER_SITE_OTHER): Payer: Self-pay

## 2019-07-11 ENCOUNTER — Ambulatory Visit (INDEPENDENT_AMBULATORY_CARE_PROVIDER_SITE_OTHER): Payer: PPO | Admitting: Nurse Practitioner

## 2019-07-11 VITALS — BP 144/78 | HR 77 | Resp 16 | Ht 73.0 in | Wt 353.0 lb

## 2019-07-11 DIAGNOSIS — I83009 Varicose veins of unspecified lower extremity with ulcer of unspecified site: Secondary | ICD-10-CM

## 2019-07-11 DIAGNOSIS — L97909 Non-pressure chronic ulcer of unspecified part of unspecified lower leg with unspecified severity: Secondary | ICD-10-CM

## 2019-07-12 ENCOUNTER — Encounter (INDEPENDENT_AMBULATORY_CARE_PROVIDER_SITE_OTHER): Payer: Self-pay | Admitting: Nurse Practitioner

## 2019-07-12 NOTE — Progress Notes (Signed)
History of Present Illness  There is no documented history at this time  Assessments & Plan   There are no diagnoses linked to this encounter.    Additional instructions  Subjective:  Patient presents with venous ulcer of the Bilateral lower extremity.    Procedure:  3 layer unna wrap was placed Bilateral lower extremity.   Plan:   Follow up in one week.  

## 2019-07-18 ENCOUNTER — Other Ambulatory Visit: Payer: Self-pay

## 2019-07-18 ENCOUNTER — Encounter (INDEPENDENT_AMBULATORY_CARE_PROVIDER_SITE_OTHER): Payer: Self-pay | Admitting: Nurse Practitioner

## 2019-07-18 ENCOUNTER — Ambulatory Visit (INDEPENDENT_AMBULATORY_CARE_PROVIDER_SITE_OTHER): Payer: PPO | Admitting: Nurse Practitioner

## 2019-07-18 VITALS — BP 119/68 | HR 68 | Resp 10 | Ht 72.0 in | Wt 350.0 lb

## 2019-07-18 DIAGNOSIS — I83009 Varicose veins of unspecified lower extremity with ulcer of unspecified site: Secondary | ICD-10-CM

## 2019-07-18 DIAGNOSIS — L97909 Non-pressure chronic ulcer of unspecified part of unspecified lower leg with unspecified severity: Secondary | ICD-10-CM

## 2019-07-18 NOTE — Progress Notes (Signed)
History of Present Illness  There is no documented history at this time  Assessments & Plan   There are no diagnoses linked to this encounter.    Additional instructions  Subjective:  Patient presents with venous ulcer of the Bilateral lower extremity.    Procedure:  3 layer unna wrap was placed Bilateral lower extremity.   Plan:   Follow up in one week.  

## 2019-07-25 ENCOUNTER — Telehealth (INDEPENDENT_AMBULATORY_CARE_PROVIDER_SITE_OTHER): Payer: Self-pay

## 2019-07-25 ENCOUNTER — Other Ambulatory Visit: Payer: Self-pay

## 2019-07-25 ENCOUNTER — Ambulatory Visit (INDEPENDENT_AMBULATORY_CARE_PROVIDER_SITE_OTHER): Payer: PPO | Admitting: Nurse Practitioner

## 2019-07-25 ENCOUNTER — Encounter (INDEPENDENT_AMBULATORY_CARE_PROVIDER_SITE_OTHER): Payer: Self-pay | Admitting: Nurse Practitioner

## 2019-07-25 VITALS — BP 152/75 | HR 67 | Resp 16 | Wt 354.0 lb

## 2019-07-25 DIAGNOSIS — L97909 Non-pressure chronic ulcer of unspecified part of unspecified lower leg with unspecified severity: Secondary | ICD-10-CM | POA: Diagnosis not present

## 2019-07-25 DIAGNOSIS — I83009 Varicose veins of unspecified lower extremity with ulcer of unspecified site: Secondary | ICD-10-CM | POA: Diagnosis not present

## 2019-07-25 DIAGNOSIS — E782 Mixed hyperlipidemia: Secondary | ICD-10-CM | POA: Diagnosis not present

## 2019-07-25 DIAGNOSIS — I890 Lymphedema, not elsewhere classified: Secondary | ICD-10-CM

## 2019-07-25 NOTE — Telephone Encounter (Signed)
The pt called and  Says that he has blister on his right leg per the NP he needs to be scheduled for wraps.

## 2019-07-26 ENCOUNTER — Ambulatory Visit (INDEPENDENT_AMBULATORY_CARE_PROVIDER_SITE_OTHER): Payer: PPO | Admitting: Nurse Practitioner

## 2019-07-26 ENCOUNTER — Encounter (INDEPENDENT_AMBULATORY_CARE_PROVIDER_SITE_OTHER): Payer: Self-pay

## 2019-07-26 VITALS — BP 117/67 | HR 67 | Resp 16

## 2019-07-26 DIAGNOSIS — I83009 Varicose veins of unspecified lower extremity with ulcer of unspecified site: Secondary | ICD-10-CM | POA: Diagnosis not present

## 2019-07-26 DIAGNOSIS — L97909 Non-pressure chronic ulcer of unspecified part of unspecified lower leg with unspecified severity: Secondary | ICD-10-CM | POA: Diagnosis not present

## 2019-07-26 NOTE — Telephone Encounter (Signed)
Patient has been added to the schedule today at 10:30 to get wrapped.

## 2019-07-26 NOTE — Progress Notes (Signed)
History of Present Illness  There is no documented history at this time  Assessments & Plan   There are no diagnoses linked to this encounter.    Additional instructions  Subjective:  Patient presents with venous ulcer of the Bilateral lower extremity.    Procedure:  3 layer unna wrap was placed Bilateral lower extremity.   Plan:   Follow up in one week.  

## 2019-07-29 ENCOUNTER — Encounter (INDEPENDENT_AMBULATORY_CARE_PROVIDER_SITE_OTHER): Payer: Self-pay | Admitting: Nurse Practitioner

## 2019-07-29 NOTE — Progress Notes (Signed)
SUBJECTIVE:  Patient ID: [name], male    DOB: 19-Aug-1950, 69 y.o.   MRN: [id number] Chief Complaint  Patient presents with  . Follow-up    unna check    HPI  [name] is a 69 y.o. male that presents today for Unna wrap check.  Today the patient does not have any venous blisters or ulcers however he continued to have severe pedal edema.  The patient does endorse staying dependent a majority of the day due to the fact that when he lays flat at night he is short of breath.  Therefore the patient sleeps in his chair.  He states that he does not have very much to elevate his legs on.  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  The patient has difficulty with compression stockings due to his pedal edema.  He denies any fever, chills, nausea, vomiting or diarrhea.  Previously the patient had a cardiology referral but he refused to go at that time as he feels his condition was getting better.  Past Medical History:  Diagnosis Date  . Diabetes mellitus without complication ([place])   . Hyperlipidemia   . Hypertension   . Leukemia [facility])     Past Surgical History:  Procedure Laterality Date  . CATARACT EXTRACTION Right   . TONSILLECTOMY    . TOOTH EXTRACTION      Social History   Socioeconomic History  . Marital status: Divorced    Spouse name: Not on file  . Number of children: Not on file  . Years of education: Not on file  . Highest education level: Not on file  Occupational History  . Not on file  Tobacco Use  . Smoking status: Former Research scientist (life sciences)  . Smokeless tobacco: Never Used  Substance and Sexual Activity  . Alcohol use: No  . Drug use: No  . Sexual activity: Not on file  Other Topics Concern  . Not on file  Social History Narrative  . Not on file   Social Determinants of Health   Financial Resource Strain:   . Difficulty of Paying Living Expenses:   Food Insecurity:   . Worried About  Charity fundraiser in the Last Year:   . Arboriculturist in the Last Year:   Transportation Needs:   . Film/video editor (Medical):   Marland Kitchen Lack of Transportation (Non-Medical):   Physical Activity:   . Days of Exercise per Week:   . Minutes of Exercise per Session:   Stress:   . Feeling of Stress :   Social Connections:   . Frequency of Communication with Friends and Family:   . Frequency of Social Gatherings with Friends and Family:   . Attends Religious Services:   . Active Member of Clubs or Organizations:   . Attends Archivist Meetings:   Marland Kitchen Marital Status:   Intimate Partner Violence:   . Fear of Current or Ex-Partner:   . Emotionally Abused:   Marland Kitchen Physically Abused:   . Sexually Abused:     Family History  Problem Relation Age of Onset  . Cancer Mother   . Stroke Father   . Cancer Sister     No Known Allergies   Review of Systems   Review of Systems: Negative Unless Checked Constitutional: [] Weight loss  [] Fever  [] Chills Cardiac: [] Chest pain   []  Atrial Fibrillation  [] Palpitations   [x] Shortness  of breath when laying flat   [] Shortness of breath with exertion. [] Shortness of breath at rest Vascular:  [] Pain in legs with walking   [] Pain in legs with standing [] Pain in legs when laying flat   [] Claudication    [] Pain in feet when laying flat    [] History of DVT   [] Phlebitis   [x] Swelling in legs   [] Varicose veins   [] Non-healing ulcers Pulmonary:   [] Uses home oxygen   [] Productive cough   [] Hemoptysis   [] Wheeze  [] COPD   [] Asthma Neurologic:  [] Dizziness   [] Seizures  [] Blackouts [] History of stroke   [] History of TIA  [] Aphasia   [] Temporary Blindness   [] Weakness or numbness in arm   [] Weakness or numbness in leg Musculoskeletal:   [] Joint swelling   [] Joint pain   [] Low back pain  []  History of Knee Replacement [] Arthritis [] back Surgeries  []  Spinal Stenosis    Hematologic:  [] Easy bruising  [] Easy bleeding   [] Hypercoagulable state    [] Anemic Gastrointestinal:  [] Diarrhea   [] Vomiting  [] Gastroesophageal reflux/heartburn   [] Difficulty swallowing. [] Abdominal pain Genitourinary:  [] Chronic kidney disease   [] Difficult urination  [] Anuric   [] Blood in urine [] Frequent urination  [] Burning with urination   [] Hematuria Skin:  [] Rashes   [] Ulcers [] Wounds Psychological:  [] History of anxiety   []  History of major depression  []  Memory Difficulties      OBJECTIVE:   Physical Exam  BP (!) 152/75 (BP Location: Right Arm)   Pulse 67   Resp 16   Wt (!) 354 lb (160.6 kg)   BMI 48.01 kg/m   Gen: WD/WN, NAD Head: [place]/AT, No temporalis wasting.  Ear/Nose/Throat: Hearing grossly intact, nares w/o erythema or drainage Eyes: PER, EOMI, sclera nonicteric.  Neck: Supple, no masses.  No JVD.  Pulmonary:  Good air movement, no use of accessory muscles.  Cardiac: RRR Vascular:  1+ leg edema however 4+ pedal edema bilaterally.  Unable to palpate pedal pulses due to edema Vessel Right Left  Radial Palpable Palpable   Gastrointestinal: soft, non-distended. No guarding/no peritoneal signs.  Musculoskeletal: M/S 5/5 throughout.  No deformity or atrophy.  Neurologic: Pain and light touch intact in extremities.  Symmetrical.  Speech is fluent. Motor exam as listed above. Psychiatric: Judgment intact, Mood & affect appropriate for pt's clinical situation. Dermatologic:  Bilateral stasis dermatitis no Ulcers Noted.  No changes consistent with cellulitis. Lymph : No Cervical lymphadenopathy, dermal thickening bilaterally       ASSESSMENT AND PLAN:  1. Lymphedema Today patient's lower extremity edema looks well in addition to his blisters being completely healed.  However, the patient continues to have excessive pedal edema.  This is likely due to the fact that the patient stays in a dependent position the majority of the time, even when he is sleeping.  The patient likely has some component of heart failure.  We attempted to get the  patient seen by cardiology however the patient refused the appointment.  This is due to the fact that the patient has had excessive weeping before and with discussion the patient seems to have orthopnea all of which can have a cardiovascular cause.  We will continue to advise the patient to utilize medical grade 1 compression stockings in addition to elevation of his lower extremities.  Patient is also advised to utilize his lymphedema pump.  We will have the patient follow-up in office in 3 months to reevaluate lower extremity edema however he should follow-up sooner if he has  new ulcerations or blisters.  2. Venous ulcer ([place]) Currently resolved at this time.  Patient is advised to contact our office for follow-up if blisters or ulcerations return, patient may need to be placed back into Unna wraps.  3. Mixed hyperlipidemia Continue statin as ordered and reviewed, no changes at this time    Current Outpatient Medications on File Prior to Visit  Medication Sig Dispense Refill  . bisoprolol-hydrochlorothiazide (ZIAC) 10-6.25 MG tablet Take 1 tablet by mouth daily.    . furosemide (LASIX) 20 MG tablet Take 20 mg by mouth.    Marland Kitchen glimepiride (AMARYL) 4 MG tablet     . hydrALAZINE (APRESOLINE) 10 MG tablet Take 10 mg by mouth 3 (three) times daily.    Marland Kitchen lisinopril-hydrochlorothiazide (PRINZIDE,ZESTORETIC) 20-12.5 MG tablet Take 1 tablet by mouth daily.    . metFORMIN (GLUCOPHAGE) 1000 MG tablet Take 1,000 mg by mouth 2 (two) times daily with a meal.    . pioglitazone (ACTOS) 15 MG tablet Take 15 mg by mouth daily.    . pravastatin (PRAVACHOL) 80 MG tablet Take 80 mg by mouth daily.     No current facility-administered medications on file prior to visit.    There are no Patient Instructions on file for this visit. No follow-ups on file.   Kris Hartmann, NP  This note was completed with Sales executive.  Any errors are purely unintentional.

## 2019-07-30 ENCOUNTER — Encounter (INDEPENDENT_AMBULATORY_CARE_PROVIDER_SITE_OTHER): Payer: Self-pay | Admitting: Nurse Practitioner

## 2019-08-02 ENCOUNTER — Other Ambulatory Visit: Payer: Self-pay

## 2019-08-02 ENCOUNTER — Ambulatory Visit (INDEPENDENT_AMBULATORY_CARE_PROVIDER_SITE_OTHER): Payer: PPO | Admitting: Nurse Practitioner

## 2019-08-02 VITALS — BP 122/64 | HR 65 | Resp 18 | Ht 73.0 in | Wt 349.0 lb

## 2019-08-02 DIAGNOSIS — I83009 Varicose veins of unspecified lower extremity with ulcer of unspecified site: Secondary | ICD-10-CM | POA: Diagnosis not present

## 2019-08-02 DIAGNOSIS — L97909 Non-pressure chronic ulcer of unspecified part of unspecified lower leg with unspecified severity: Secondary | ICD-10-CM | POA: Diagnosis not present

## 2019-08-02 NOTE — Progress Notes (Signed)
History of Present Illness  There is no documented history at this time  Assessments & Plan   There are no diagnoses linked to this encounter.    Additional instructions  Subjective:  Patient presents with venous ulcer of the Bilateral lower extremity.    Procedure:  3 layer unna wrap was placed Bilateral lower extremity.   Plan:   Follow up in one week.  

## 2019-08-04 ENCOUNTER — Encounter (INDEPENDENT_AMBULATORY_CARE_PROVIDER_SITE_OTHER): Payer: Self-pay | Admitting: Nurse Practitioner

## 2019-08-08 ENCOUNTER — Other Ambulatory Visit: Payer: Self-pay

## 2019-08-08 ENCOUNTER — Encounter (INDEPENDENT_AMBULATORY_CARE_PROVIDER_SITE_OTHER): Payer: Self-pay

## 2019-08-08 ENCOUNTER — Ambulatory Visit (INDEPENDENT_AMBULATORY_CARE_PROVIDER_SITE_OTHER): Payer: PPO | Admitting: Nurse Practitioner

## 2019-08-08 VITALS — BP 115/69 | HR 73 | Resp 16 | Wt 351.6 lb

## 2019-08-08 DIAGNOSIS — L97909 Non-pressure chronic ulcer of unspecified part of unspecified lower leg with unspecified severity: Secondary | ICD-10-CM | POA: Diagnosis not present

## 2019-08-08 DIAGNOSIS — I83009 Varicose veins of unspecified lower extremity with ulcer of unspecified site: Secondary | ICD-10-CM | POA: Diagnosis not present

## 2019-08-08 NOTE — Progress Notes (Signed)
History of Present Illness  There is no documented history at this time  Assessments & Plan   There are no diagnoses linked to this encounter.    Additional instructions  Subjective:  Patient presents with venous ulcer of the Bilateral lower extremity.    Procedure:  3 layer unna wrap was placed Bilateral lower extremity.   Plan:   Follow up in one week.  

## 2019-08-12 ENCOUNTER — Encounter (INDEPENDENT_AMBULATORY_CARE_PROVIDER_SITE_OTHER): Payer: Self-pay | Admitting: Nurse Practitioner

## 2019-08-16 ENCOUNTER — Ambulatory Visit (INDEPENDENT_AMBULATORY_CARE_PROVIDER_SITE_OTHER): Payer: PPO | Admitting: Nurse Practitioner

## 2019-08-16 ENCOUNTER — Encounter (INDEPENDENT_AMBULATORY_CARE_PROVIDER_SITE_OTHER): Payer: Self-pay

## 2019-08-16 ENCOUNTER — Other Ambulatory Visit: Payer: Self-pay

## 2019-08-16 VITALS — BP 131/71 | HR 69 | Resp 18 | Ht 72.0 in | Wt 355.0 lb

## 2019-08-16 DIAGNOSIS — L97909 Non-pressure chronic ulcer of unspecified part of unspecified lower leg with unspecified severity: Secondary | ICD-10-CM | POA: Diagnosis not present

## 2019-08-16 DIAGNOSIS — I83009 Varicose veins of unspecified lower extremity with ulcer of unspecified site: Secondary | ICD-10-CM

## 2019-08-16 NOTE — Progress Notes (Signed)
History of Present Illness  There is no documented history at this time  Assessments & Plan   There are no diagnoses linked to this encounter.    Additional instructions  Subjective:  Patient presents with venous ulcer of the Bilateral lower extremity.    Procedure:  3 layer unna wrap was placed Bilateral lower extremity.   Plan:   Follow up in one week.  

## 2019-08-19 ENCOUNTER — Other Ambulatory Visit: Payer: Self-pay

## 2019-08-19 ENCOUNTER — Ambulatory Visit (INDEPENDENT_AMBULATORY_CARE_PROVIDER_SITE_OTHER): Payer: PPO | Admitting: Nurse Practitioner

## 2019-08-19 ENCOUNTER — Telehealth (INDEPENDENT_AMBULATORY_CARE_PROVIDER_SITE_OTHER): Payer: Self-pay

## 2019-08-19 VITALS — BP 145/73 | HR 60 | Ht 73.0 in | Wt 354.0 lb

## 2019-08-19 DIAGNOSIS — I83009 Varicose veins of unspecified lower extremity with ulcer of unspecified site: Secondary | ICD-10-CM | POA: Diagnosis not present

## 2019-08-19 DIAGNOSIS — L97909 Non-pressure chronic ulcer of unspecified part of unspecified lower leg with unspecified severity: Secondary | ICD-10-CM | POA: Diagnosis not present

## 2019-08-19 NOTE — Progress Notes (Signed)
History of Present Illness  There is no documented history at this time  Assessments & Plan   There are no diagnoses linked to this encounter.    Additional instructions  Subjective:  Patient presents with venous ulcer of the Bilateral lower extremity.    Procedure:  3 layer unna wrap was placed Bilateral lower extremity.   Plan:   Follow up in one week.  

## 2019-08-19 NOTE — Telephone Encounter (Signed)
sure

## 2019-08-19 NOTE — Telephone Encounter (Signed)
Pt called and wants to know can  He come in and be re wrapped due to tightness.

## 2019-08-19 NOTE — Telephone Encounter (Signed)
I called and made the pt aware that that some one from out office will be getting in touch with him to come in and be re wrapped.

## 2019-08-20 ENCOUNTER — Encounter (INDEPENDENT_AMBULATORY_CARE_PROVIDER_SITE_OTHER): Payer: Self-pay | Admitting: Nurse Practitioner

## 2019-08-22 ENCOUNTER — Encounter (INDEPENDENT_AMBULATORY_CARE_PROVIDER_SITE_OTHER): Payer: Self-pay

## 2019-08-22 ENCOUNTER — Ambulatory Visit (INDEPENDENT_AMBULATORY_CARE_PROVIDER_SITE_OTHER): Payer: PPO | Admitting: Vascular Surgery

## 2019-08-23 ENCOUNTER — Encounter (INDEPENDENT_AMBULATORY_CARE_PROVIDER_SITE_OTHER): Payer: PPO

## 2019-08-26 ENCOUNTER — Ambulatory Visit (INDEPENDENT_AMBULATORY_CARE_PROVIDER_SITE_OTHER): Payer: PPO | Admitting: Vascular Surgery

## 2019-08-29 ENCOUNTER — Ambulatory Visit (INDEPENDENT_AMBULATORY_CARE_PROVIDER_SITE_OTHER): Payer: PPO | Admitting: Nurse Practitioner

## 2019-08-29 ENCOUNTER — Other Ambulatory Visit: Payer: Self-pay

## 2019-08-29 ENCOUNTER — Encounter (INDEPENDENT_AMBULATORY_CARE_PROVIDER_SITE_OTHER): Payer: Self-pay | Admitting: Nurse Practitioner

## 2019-08-29 VITALS — BP 127/77 | HR 67 | Resp 16 | Wt 354.0 lb

## 2019-08-29 DIAGNOSIS — I83009 Varicose veins of unspecified lower extremity with ulcer of unspecified site: Secondary | ICD-10-CM | POA: Diagnosis not present

## 2019-08-29 DIAGNOSIS — I890 Lymphedema, not elsewhere classified: Secondary | ICD-10-CM

## 2019-08-29 DIAGNOSIS — I10 Essential (primary) hypertension: Secondary | ICD-10-CM

## 2019-08-29 DIAGNOSIS — L97909 Non-pressure chronic ulcer of unspecified part of unspecified lower leg with unspecified severity: Secondary | ICD-10-CM

## 2019-09-04 ENCOUNTER — Encounter (INDEPENDENT_AMBULATORY_CARE_PROVIDER_SITE_OTHER): Payer: Self-pay | Admitting: Nurse Practitioner

## 2019-09-04 NOTE — Progress Notes (Signed)
Subjective:    Patient ID: [name], male    DOB: [birth year], 69 y.o.   MRN: [id number] Chief Complaint  Patient presents with  . Follow-up    unna check    [name] is a 69 y.o. male that presents today for Unna wrap check.  Patient continues to have severe pedal edema and has actually developed some small blisters just posterior to his knee.  The patient continues to sleep in his chair which likely contributes to his edema.  The patient also has difficulty with elevating his legs.  He denies any current issues with utilizing his medical grade 1 compression stockings.  He denies fever, chills, nausea, vomiting or diarrhea.  He denies any pain of his lower extremities.         Review of Systems  Cardiovascular: Positive for leg swelling.  Skin: Positive for rash.  All other systems reviewed and are negative.      Objective:   Physical Exam Vitals reviewed.  Constitutional:      Appearance: Normal appearance.  Cardiovascular:     Rate and Rhythm: Normal rate and regular rhythm.     Pulses:          Radial pulses are 2+ on the left side.  Musculoskeletal:     Right lower leg: 3+ Edema present.     Left lower leg: 3+ Edema present.     Comments: Blister right lower extremity  Neurological:     Mental Status: He is alert and oriented to person, place, and time.  Psychiatric:        Mood and Affect: Mood normal.        Behavior: Behavior normal.        Thought Content: Thought content normal.        Judgment: Judgment normal.     BP 127/77 (BP Location: Right Arm)   Pulse 67   Resp 16   Wt (!) 354 lb (160.6 kg)   BMI 46.70 kg/m   Past Medical History:  Diagnosis Date  . Diabetes mellitus without complication ([place])   . Hyperlipidemia   . Hypertension   . Leukemia [facility])     Social History   Socioeconomic History  . Marital status: Divorced    Spouse name: Not on file  . Number of children: Not on file  . Years of education: Not on file  .  Highest education level: Not on file  Occupational History  . Not on file  Tobacco Use  . Smoking status: Former Research scientist (life sciences)  . Smokeless tobacco: Never Used  Substance and Sexual Activity  . Alcohol use: No  . Drug use: No  . Sexual activity: Not on file  Other Topics Concern  . Not on file  Social History Narrative  . Not on file   Social Determinants of Health   Financial Resource Strain:   . Difficulty of Paying Living Expenses:   Food Insecurity:   . Worried About Charity fundraiser in the Last Year:   . Arboriculturist in the Last Year:   Transportation Needs:   . Film/video editor (Medical):   Marland Kitchen Lack of Transportation (Non-Medical):   Physical Activity:   . Days of Exercise per Week:   . Minutes of Exercise per Session:   Stress:   . Feeling of Stress :   Social Connections:   . Frequency of Communication with Friends and Family:   . Frequency of Social Gatherings with Friends  and Family:   . Attends Religious Services:   . Active Member of Clubs or Organizations:   . Attends Archivist Meetings:   Marland Kitchen Marital Status:   Intimate Partner Violence:   . Fear of Current or Ex-Partner:   . Emotionally Abused:   Marland Kitchen Physically Abused:   . Sexually Abused:     Past Surgical History:  Procedure Laterality Date  . CATARACT EXTRACTION Right   . TONSILLECTOMY    . TOOTH EXTRACTION      Family History  Problem Relation Age of Onset  . Cancer Mother   . Stroke Father   . Cancer Sister     No Known Allergies     Assessment & Plan:   1. Lymphedema  No surgery or intervention at this point in time.    I have reviewed my discussion with the patient regarding lymphedema and why it  causes symptoms.  Patient will continue wearing graduated compression stockings class 1 (20-30 mmHg) on a daily basis a prescription was given. The patient is reminded to put the stockings on first thing in the morning and removing them in the evening. The patient is instructed  specifically not to sleep in the stockings.   In addition, behavioral modification throughout the day will be continued.  This will include frequent elevation (such as in a recliner), use of over the counter pain medications as needed and exercise such as walking.  Patient is also encouraged to continue to utilize his lymphedema pump on a regular basis.   2. Venous ulcer ([place]) We will continue to place the patient in bilateral Unna wraps.  We will have the patient return to the office for weekly wrap changes.  The patient is encouraged to adhere to conservative therapy such as elevating his lower extremities and exercise when possible.  We will reevaluate the patient's lower extremity edema and 4 weeks.  3. Essential hypertension Continue antihypertensive medications as already ordered, these medications have been reviewed and there are no changes at this time.    Current Outpatient Medications on File Prior to Visit  Medication Sig Dispense Refill  . bisoprolol-hydrochlorothiazide (ZIAC) 10-6.25 MG tablet Take 1 tablet by mouth daily.    . furosemide (LASIX) 20 MG tablet Take 20 mg by mouth.    Marland Kitchen glimepiride (AMARYL) 4 MG tablet     . hydrALAZINE (APRESOLINE) 10 MG tablet Take 10 mg by mouth 3 (three) times daily.    Marland Kitchen lisinopril-hydrochlorothiazide (PRINZIDE,ZESTORETIC) 20-12.5 MG tablet Take 1 tablet by mouth daily.    . metFORMIN (GLUCOPHAGE) 1000 MG tablet Take 1,000 mg by mouth 2 (two) times daily with a meal.    . pioglitazone (ACTOS) 15 MG tablet Take 15 mg by mouth daily.    . pravastatin (PRAVACHOL) 80 MG tablet Take 80 mg by mouth daily.     No current facility-administered medications on file prior to visit.    There are no Patient Instructions on file for this visit. No follow-ups on file.   Kris Hartmann, NP

## 2019-09-05 ENCOUNTER — Other Ambulatory Visit: Payer: Self-pay

## 2019-09-05 ENCOUNTER — Ambulatory Visit (INDEPENDENT_AMBULATORY_CARE_PROVIDER_SITE_OTHER): Payer: PPO | Admitting: Nurse Practitioner

## 2019-09-05 ENCOUNTER — Encounter (INDEPENDENT_AMBULATORY_CARE_PROVIDER_SITE_OTHER): Payer: Self-pay

## 2019-09-05 VITALS — BP 130/73 | HR 68 | Resp 16 | Wt 356.0 lb

## 2019-09-05 DIAGNOSIS — L97909 Non-pressure chronic ulcer of unspecified part of unspecified lower leg with unspecified severity: Secondary | ICD-10-CM

## 2019-09-05 DIAGNOSIS — I83009 Varicose veins of unspecified lower extremity with ulcer of unspecified site: Secondary | ICD-10-CM

## 2019-09-05 NOTE — Progress Notes (Signed)
History of Present Illness  There is no documented history at this time  Assessments & Plan   There are no diagnoses linked to this encounter.    Additional instructions  Subjective:  Patient presents with venous ulcer of the Bilateral lower extremity.    Procedure:  3 layer unna wrap was placed Bilateral lower extremity.   Plan:   Follow up in one week.  

## 2019-09-12 ENCOUNTER — Ambulatory Visit (INDEPENDENT_AMBULATORY_CARE_PROVIDER_SITE_OTHER): Payer: PPO | Admitting: Nurse Practitioner

## 2019-09-12 ENCOUNTER — Other Ambulatory Visit: Payer: Self-pay

## 2019-09-12 ENCOUNTER — Encounter (INDEPENDENT_AMBULATORY_CARE_PROVIDER_SITE_OTHER): Payer: Self-pay | Admitting: Nurse Practitioner

## 2019-09-12 VITALS — BP 113/65 | HR 59 | Ht 73.0 in | Wt 354.0 lb

## 2019-09-12 DIAGNOSIS — I83009 Varicose veins of unspecified lower extremity with ulcer of unspecified site: Secondary | ICD-10-CM | POA: Diagnosis not present

## 2019-09-12 DIAGNOSIS — L97909 Non-pressure chronic ulcer of unspecified part of unspecified lower leg with unspecified severity: Secondary | ICD-10-CM

## 2019-09-12 NOTE — Progress Notes (Signed)
History of Present Illness  There is no documented history at this time  Assessments & Plan   There are no diagnoses linked to this encounter.    Additional instructions  Subjective:  Patient presents with venous ulcer of the Bilateral lower extremity.    Procedure:  3 layer unna wrap was placed Bilateral lower extremity.   Plan:   Follow up in one week.  

## 2019-09-16 DIAGNOSIS — H903 Sensorineural hearing loss, bilateral: Secondary | ICD-10-CM | POA: Diagnosis not present

## 2019-09-16 DIAGNOSIS — H6123 Impacted cerumen, bilateral: Secondary | ICD-10-CM | POA: Diagnosis not present

## 2019-09-19 ENCOUNTER — Ambulatory Visit (INDEPENDENT_AMBULATORY_CARE_PROVIDER_SITE_OTHER): Payer: PPO | Admitting: Nurse Practitioner

## 2019-09-19 ENCOUNTER — Other Ambulatory Visit: Payer: Self-pay

## 2019-09-19 ENCOUNTER — Encounter (INDEPENDENT_AMBULATORY_CARE_PROVIDER_SITE_OTHER): Payer: Self-pay

## 2019-09-19 VITALS — BP 122/72 | HR 78 | Resp 16

## 2019-09-19 DIAGNOSIS — I83009 Varicose veins of unspecified lower extremity with ulcer of unspecified site: Secondary | ICD-10-CM

## 2019-09-19 DIAGNOSIS — L97909 Non-pressure chronic ulcer of unspecified part of unspecified lower leg with unspecified severity: Secondary | ICD-10-CM

## 2019-09-19 NOTE — Progress Notes (Signed)
History of Present Illness  There is no documented history at this time  Assessments & Plan   There are no diagnoses linked to this encounter.    Additional instructions  Subjective:  Patient presents with venous ulcer of the Bilateral lower extremity.    Procedure:  3 layer unna wrap was placed Bilateral lower extremity.   Plan:   Follow up in one week.  

## 2019-09-26 ENCOUNTER — Other Ambulatory Visit: Payer: Self-pay

## 2019-09-26 ENCOUNTER — Ambulatory Visit (INDEPENDENT_AMBULATORY_CARE_PROVIDER_SITE_OTHER): Payer: PPO | Admitting: Nurse Practitioner

## 2019-09-26 ENCOUNTER — Encounter (INDEPENDENT_AMBULATORY_CARE_PROVIDER_SITE_OTHER): Payer: Self-pay

## 2019-09-26 VITALS — BP 123/69 | HR 80 | Resp 16 | Wt 353.0 lb

## 2019-09-26 DIAGNOSIS — L97909 Non-pressure chronic ulcer of unspecified part of unspecified lower leg with unspecified severity: Secondary | ICD-10-CM

## 2019-09-26 DIAGNOSIS — I83009 Varicose veins of unspecified lower extremity with ulcer of unspecified site: Secondary | ICD-10-CM | POA: Diagnosis not present

## 2019-09-26 NOTE — Progress Notes (Signed)
History of Present Illness  There is no documented history at this time  Assessments & Plan   There are no diagnoses linked to this encounter.    Additional instructions  Subjective:  Patient presents with venous ulcer of the Bilateral lower extremity.    Procedure:  3 layer unna wrap was placed Bilateral lower extremity.   Plan:   Follow up in one week.  

## 2019-10-03 ENCOUNTER — Other Ambulatory Visit: Payer: Self-pay

## 2019-10-03 ENCOUNTER — Ambulatory Visit (INDEPENDENT_AMBULATORY_CARE_PROVIDER_SITE_OTHER): Payer: PPO | Admitting: Nurse Practitioner

## 2019-10-03 ENCOUNTER — Encounter (INDEPENDENT_AMBULATORY_CARE_PROVIDER_SITE_OTHER): Payer: Self-pay | Admitting: Nurse Practitioner

## 2019-10-03 VITALS — BP 123/66 | HR 76 | Ht 73.0 in | Wt 350.0 lb

## 2019-10-03 DIAGNOSIS — L03116 Cellulitis of left lower limb: Secondary | ICD-10-CM

## 2019-10-03 DIAGNOSIS — I10 Essential (primary) hypertension: Secondary | ICD-10-CM

## 2019-10-03 DIAGNOSIS — I890 Lymphedema, not elsewhere classified: Secondary | ICD-10-CM

## 2019-10-03 DIAGNOSIS — L97909 Non-pressure chronic ulcer of unspecified part of unspecified lower leg with unspecified severity: Secondary | ICD-10-CM | POA: Diagnosis not present

## 2019-10-03 DIAGNOSIS — I83009 Varicose veins of unspecified lower extremity with ulcer of unspecified site: Secondary | ICD-10-CM | POA: Diagnosis not present

## 2019-10-03 DIAGNOSIS — E782 Mixed hyperlipidemia: Secondary | ICD-10-CM

## 2019-10-03 MED ORDER — DOXYCYCLINE HYCLATE 100 MG PO CAPS: 100.0000 mg | ORAL_CAPSULE | Freq: Two times a day (BID) | ORAL | 0 refills | Status: DC

## 2019-10-04 ENCOUNTER — Encounter (INDEPENDENT_AMBULATORY_CARE_PROVIDER_SITE_OTHER): Payer: Self-pay | Admitting: Nurse Practitioner

## 2019-10-04 NOTE — Progress Notes (Signed)
Subjective:    Patient ID: [name], male    DOB: 08-Sep-1950, 69 y.o.   MRN: [id number] Chief Complaint  Patient presents with  . Follow-up    BIL unna  boot    [name] is a 69 y.o. male that presents today for Unna wrap check.  The patient continues to have excessive pedal edema however it is slightly decreased from his last visit.  The patient does not have any blisters however he continues to have a shallow ulceration on the right lower extremity.  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  The patient is also not able to utilize his lymphedema pump as much as possible.  He is adherent with his Unna wraps.  The patient is somewhat active.  He denies any fever, chills or syncope.  The patient's left lower extremity is somewhat concerning for cellulitis as he has had some increased redness and discomfort.        Review of Systems  Cardiovascular: Positive for leg swelling.  Skin: Positive for color change.  All other systems reviewed and are negative.      Objective:   Physical Exam Vitals reviewed.  Cardiovascular:     Rate and Rhythm: Normal rate and regular rhythm.     Comments: 4+ pedal edema Musculoskeletal:     Right lower leg: 3+ Edema present.     Left lower leg: 3+ Edema present.  Skin:    Findings: Erythema (Left lower extremity) present.  Neurological:     Mental Status: He is alert and oriented to person, place, and time.  Psychiatric:        Mood and Affect: Mood normal.        Behavior: Behavior normal.        Thought Content: Thought content normal.        Judgment: Judgment normal.     BP 123/66   Pulse 76   Ht 6\' 1"  (1.854 m)   Wt (!) 350 lb (158.8 kg)   BMI 46.18 kg/m   Past Medical History:  Diagnosis Date  . Diabetes mellitus without complication ([place])   . Hyperlipidemia   . Hypertension   . Leukemia [facility])     Social History    Socioeconomic History  . Marital status: Divorced    Spouse name: Not on file  . Number of children: Not on file  . Years of education: Not on file  . Highest education level: Not on file  Occupational History  . Not on file  Tobacco Use  . Smoking status: Former Research scientist (life sciences)  . Smokeless tobacco: Never Used  Substance and Sexual Activity  . Alcohol use: No  . Drug use: No  . Sexual activity: Not on file  Other Topics Concern  . Not on file  Social History Narrative  . Not on file   Social Determinants of Health   Financial Resource Strain:   . Difficulty of Paying Living Expenses:   Food Insecurity:   . Worried About Charity fundraiser in the Last Year:   . Arboriculturist in the Last Year:   Transportation Needs:   . Film/video editor (Medical):   Marland Kitchen Lack of Transportation (Non-Medical):   Physical Activity:   . Days of Exercise per Week:   . Minutes of Exercise per Session:   Stress:   . Feeling of Stress :  Social Connections:   . Frequency of Communication with Friends and Family:   . Frequency of Social Gatherings with Friends and Family:   . Attends Religious Services:   . Active Member of Clubs or Organizations:   . Attends Archivist Meetings:   Marland Kitchen Marital Status:   Intimate Partner Violence:   . Fear of Current or Ex-Partner:   . Emotionally Abused:   Marland Kitchen Physically Abused:   . Sexually Abused:     Past Surgical History:  Procedure Laterality Date  . CATARACT EXTRACTION Right   . TONSILLECTOMY    . TOOTH EXTRACTION      Family History  Problem Relation Age of Onset  . Cancer Mother   . Stroke Father   . Cancer Sister     No Known Allergies     Assessment & Plan:   1. Venous ulcer ([place]) Patient will continue to remain in bilateral Unna wraps.  The patient will continue to come to the office on a weekly basis for changes.  We will reevaluate his ulceration in 4 weeks at his follow-up.  2. Lymphedema Patient is encouraged to  try to utilize conservative tactics more.  He is advised to elevate as much as he possibly can although it is somewhat difficult.  The patient is also advised to try to utilize his lymphedema pump even if it is for short periods.  3. Essential hypertension Continue antihypertensive medications as already ordered, these medications have been reviewed and there are no changes at this time.   4. Mixed hyperlipidemia Continue statin as ordered and reviewed, no changes at this time   5. Cellulitis of left lower extremity Treatment and control of lower extremity lymphedema will be helpful to treating his cellulitis.  We will place the patient on a short course of antibiotics.  We will evaluate the patient when he presents to the office for his Unna wraps.  Patient is advised that if he begins to have fever, chills, chest pain or shortness of breath he should present to the emergency room for evaluation. - doxycycline (VIBRAMYCIN) 100 MG capsule; Take 1 capsule (100 mg total) by mouth 2 (two) times daily.  Dispense: 14 capsule; Refill: 0   Current Outpatient Medications on File Prior to Visit  Medication Sig Dispense Refill  . bisoprolol-hydrochlorothiazide (ZIAC) 10-6.25 MG tablet Take 1 tablet by mouth daily.    . furosemide (LASIX) 20 MG tablet Take 20 mg by mouth.    Marland Kitchen glimepiride (AMARYL) 4 MG tablet     . hydrALAZINE (APRESOLINE) 10 MG tablet Take 10 mg by mouth 3 (three) times daily.    Marland Kitchen lisinopril-hydrochlorothiazide (PRINZIDE,ZESTORETIC) 20-12.5 MG tablet Take 1 tablet by mouth daily.    . metFORMIN (GLUCOPHAGE) 1000 MG tablet Take 1,000 mg by mouth 2 (two) times daily with a meal.    . pioglitazone (ACTOS) 15 MG tablet Take 15 mg by mouth daily.    . pravastatin (PRAVACHOL) 80 MG tablet Take 80 mg by mouth daily.     No current facility-administered medications on file prior to visit.    There are no Patient Instructions on file for this visit. No follow-ups on file.   Kris Hartmann, NP

## 2019-10-10 ENCOUNTER — Other Ambulatory Visit: Payer: Self-pay

## 2019-10-10 ENCOUNTER — Ambulatory Visit (INDEPENDENT_AMBULATORY_CARE_PROVIDER_SITE_OTHER): Payer: PPO | Admitting: Nurse Practitioner

## 2019-10-10 ENCOUNTER — Encounter (INDEPENDENT_AMBULATORY_CARE_PROVIDER_SITE_OTHER): Payer: Self-pay | Admitting: Nurse Practitioner

## 2019-10-10 VITALS — BP 121/75 | HR 80 | Ht 73.0 in | Wt 351.0 lb

## 2019-10-10 DIAGNOSIS — I83009 Varicose veins of unspecified lower extremity with ulcer of unspecified site: Secondary | ICD-10-CM | POA: Diagnosis not present

## 2019-10-10 DIAGNOSIS — L97909 Non-pressure chronic ulcer of unspecified part of unspecified lower leg with unspecified severity: Secondary | ICD-10-CM | POA: Diagnosis not present

## 2019-10-10 NOTE — Progress Notes (Signed)
History of Present Illness  There is no documented history at this time  Assessments & Plan   There are no diagnoses linked to this encounter.    Additional instructions  Subjective:  Patient presents with venous ulcer of the Bilateral lower extremity.    Procedure:  3 layer unna wrap was placed Bilateral lower extremity.   Plan:   Follow up in one week.  

## 2019-10-17 ENCOUNTER — Other Ambulatory Visit: Payer: Self-pay

## 2019-10-17 ENCOUNTER — Ambulatory Visit (INDEPENDENT_AMBULATORY_CARE_PROVIDER_SITE_OTHER): Payer: PPO | Admitting: Nurse Practitioner

## 2019-10-17 VITALS — BP 132/72 | HR 54 | Ht 72.0 in | Wt 356.0 lb

## 2019-10-17 DIAGNOSIS — I83009 Varicose veins of unspecified lower extremity with ulcer of unspecified site: Secondary | ICD-10-CM | POA: Diagnosis not present

## 2019-10-17 DIAGNOSIS — L97909 Non-pressure chronic ulcer of unspecified part of unspecified lower leg with unspecified severity: Secondary | ICD-10-CM | POA: Diagnosis not present

## 2019-10-17 NOTE — Progress Notes (Signed)
History of Present Illness  There is no documented history at this time  Assessments & Plan   There are no diagnoses linked to this encounter.    Additional instructions  Subjective:  Patient presents with venous ulcer of the Bilateral lower extremity.    Procedure:  3 layer unna wrap was placed Bilateral lower extremity.   Plan:   Follow up in one week.  

## 2019-10-21 ENCOUNTER — Encounter (INDEPENDENT_AMBULATORY_CARE_PROVIDER_SITE_OTHER): Payer: Self-pay | Admitting: Nurse Practitioner

## 2019-10-24 ENCOUNTER — Other Ambulatory Visit: Payer: Self-pay

## 2019-10-24 ENCOUNTER — Ambulatory Visit (INDEPENDENT_AMBULATORY_CARE_PROVIDER_SITE_OTHER): Payer: PPO | Admitting: Nurse Practitioner

## 2019-10-24 ENCOUNTER — Encounter (INDEPENDENT_AMBULATORY_CARE_PROVIDER_SITE_OTHER): Payer: Self-pay | Admitting: Nurse Practitioner

## 2019-10-24 VITALS — BP 144/81 | HR 79 | Ht 73.0 in | Wt 355.0 lb

## 2019-10-24 DIAGNOSIS — I83009 Varicose veins of unspecified lower extremity with ulcer of unspecified site: Secondary | ICD-10-CM | POA: Diagnosis not present

## 2019-10-24 DIAGNOSIS — L97909 Non-pressure chronic ulcer of unspecified part of unspecified lower leg with unspecified severity: Secondary | ICD-10-CM

## 2019-10-24 NOTE — Progress Notes (Signed)
History of Present Illness  There is no documented history at this time  Assessments & Plan   There are no diagnoses linked to this encounter.    Additional instructions  Subjective:  Patient presents with venous ulcer of the Bilateral lower extremity.    Procedure:  3 layer unna wrap was placed Bilateral lower extremity.   Plan:   Follow up in one week.  

## 2019-10-31 ENCOUNTER — Ambulatory Visit (INDEPENDENT_AMBULATORY_CARE_PROVIDER_SITE_OTHER): Payer: PPO | Admitting: Nurse Practitioner

## 2019-10-31 ENCOUNTER — Other Ambulatory Visit: Payer: Self-pay

## 2019-10-31 ENCOUNTER — Encounter (INDEPENDENT_AMBULATORY_CARE_PROVIDER_SITE_OTHER): Payer: Self-pay

## 2019-10-31 VITALS — BP 132/77 | HR 56 | Resp 14 | Ht 73.0 in | Wt 352.0 lb

## 2019-10-31 DIAGNOSIS — I83009 Varicose veins of unspecified lower extremity with ulcer of unspecified site: Secondary | ICD-10-CM

## 2019-10-31 DIAGNOSIS — L97909 Non-pressure chronic ulcer of unspecified part of unspecified lower leg with unspecified severity: Secondary | ICD-10-CM

## 2019-10-31 NOTE — Progress Notes (Signed)
History of Present Illness  There is no documented history at this time  Assessments & Plan   There are no diagnoses linked to this encounter.    Additional instructions  Subjective:  Patient presents with venous ulcer of the Bilateral lower extremity.    Procedure:  3 layer unna wrap was placed Bilateral lower extremity.   Plan:   Follow up in one week.  

## 2019-11-03 ENCOUNTER — Encounter (INDEPENDENT_AMBULATORY_CARE_PROVIDER_SITE_OTHER): Payer: Self-pay | Admitting: Nurse Practitioner

## 2019-11-07 ENCOUNTER — Other Ambulatory Visit: Payer: Self-pay

## 2019-11-07 ENCOUNTER — Ambulatory Visit (INDEPENDENT_AMBULATORY_CARE_PROVIDER_SITE_OTHER): Payer: PPO | Admitting: Vascular Surgery

## 2019-11-07 ENCOUNTER — Encounter (INDEPENDENT_AMBULATORY_CARE_PROVIDER_SITE_OTHER): Payer: Self-pay

## 2019-11-07 VITALS — BP 126/71 | HR 61 | Resp 16 | Wt 344.0 lb

## 2019-11-07 DIAGNOSIS — L97909 Non-pressure chronic ulcer of unspecified part of unspecified lower leg with unspecified severity: Secondary | ICD-10-CM | POA: Diagnosis not present

## 2019-11-07 DIAGNOSIS — I83009 Varicose veins of unspecified lower extremity with ulcer of unspecified site: Secondary | ICD-10-CM | POA: Diagnosis not present

## 2019-11-07 DIAGNOSIS — I872 Venous insufficiency (chronic) (peripheral): Secondary | ICD-10-CM

## 2019-11-07 NOTE — Progress Notes (Signed)
History of Present Illness  There is no documented history at this time  Assessments & Plan   There are no diagnoses linked to this encounter.    Additional instructions  Subjective:  Patient presents with venous ulcer of the Bilateral lower extremity.    Procedure:  3 layer unna wrap was placed Bilateral lower extremity.   Plan:   Follow up in one week.  

## 2019-11-14 ENCOUNTER — Encounter (INDEPENDENT_AMBULATORY_CARE_PROVIDER_SITE_OTHER): Payer: Self-pay | Admitting: Vascular Surgery

## 2019-11-14 ENCOUNTER — Other Ambulatory Visit: Payer: Self-pay

## 2019-11-14 ENCOUNTER — Ambulatory Visit (INDEPENDENT_AMBULATORY_CARE_PROVIDER_SITE_OTHER): Payer: PPO | Admitting: Vascular Surgery

## 2019-11-14 VITALS — BP 134/77 | HR 101 | Ht 73.0 in | Wt 350.0 lb

## 2019-11-14 DIAGNOSIS — E782 Mixed hyperlipidemia: Secondary | ICD-10-CM | POA: Diagnosis not present

## 2019-11-14 DIAGNOSIS — L97909 Non-pressure chronic ulcer of unspecified part of unspecified lower leg with unspecified severity: Secondary | ICD-10-CM

## 2019-11-14 DIAGNOSIS — E11620 Type 2 diabetes mellitus with diabetic dermatitis: Secondary | ICD-10-CM

## 2019-11-14 DIAGNOSIS — I83009 Varicose veins of unspecified lower extremity with ulcer of unspecified site: Secondary | ICD-10-CM | POA: Diagnosis not present

## 2019-11-14 DIAGNOSIS — I872 Venous insufficiency (chronic) (peripheral): Secondary | ICD-10-CM | POA: Diagnosis not present

## 2019-11-14 DIAGNOSIS — I10 Essential (primary) hypertension: Secondary | ICD-10-CM | POA: Diagnosis not present

## 2019-11-14 NOTE — Progress Notes (Signed)
MRN : [id number]  [name] is a 69 y.o. (13-Jun-1950) male who presents with chief complaint of  Chief Complaint  Patient presents with   Follow-up    unna boot check  .  History of Present Illness:   The patient presents today for Unna wrap check.  The patient continues to have excessive pedal edema however it is slightly decreased from his last visit.  The patient does not have any blisters however he continues to have a shallow ulceration of the left medial ankle, the right  lower extremity is improved.  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  The patient is able to utilize his lymphedema pump two time a day but doesn't feel it is much help.  He is adherent with his Unna wraps.  The patient is somewhat active.  He denies any fever, chills or syncope.  The patient's left lower extremity is somewhat concerning for cellulitis as he has had some increased redness and discomfort.   Current Meds  Medication Sig   bisoprolol-hydrochlorothiazide (ZIAC) 10-6.25 MG tablet Take 1 tablet by mouth daily.   doxycycline (VIBRAMYCIN) 100 MG capsule Take 1 capsule (100 mg total) by mouth 2 (two) times daily.   furosemide (LASIX) 20 MG tablet Take 20 mg by mouth.   glimepiride (AMARYL) 4 MG tablet    hydrALAZINE (APRESOLINE) 10 MG tablet Take 10 mg by mouth 3 (three) times daily.   lisinopril-hydrochlorothiazide (PRINZIDE,ZESTORETIC) 20-12.5 MG tablet Take 1 tablet by mouth daily.   metFORMIN (GLUCOPHAGE) 1000 MG tablet Take 1,000 mg by mouth 2 (two) times daily with a meal.   pioglitazone (ACTOS) 15 MG tablet Take 15 mg by mouth daily.   pravastatin (PRAVACHOL) 80 MG tablet Take 80 mg by mouth daily.   sertraline (ZOLOFT) 25 MG tablet Take 25 mg by mouth daily.    Past Medical History:  Diagnosis Date   Diabetes mellitus without complication ([place])    Hyperlipidemia    Hypertension     Leukemia ([place])     Past Surgical History:  Procedure Laterality Date   CATARACT EXTRACTION Right    TONSILLECTOMY     TOOTH EXTRACTION      Social History Social History   Tobacco Use   Smoking status: Former Smoker   Smokeless tobacco: Never Used  Substance Use Topics   Alcohol use: No   Drug use: No    Family History Family History  Problem Relation Age of Onset   Cancer Mother    Stroke Father    Cancer Sister     No Known Allergies   REVIEW OF SYSTEMS (Negative unless checked)  Constitutional: [] Weight loss  [] Fever  [] Chills Cardiac: [] Chest pain   [] Chest pressure   [] Palpitations   [] Shortness of breath when laying flat   [] Shortness of breath with exertion. Vascular:  [] Pain in legs with walking   [x] Pain in legs at rest  [] History of DVT   [] Phlebitis   [] Swelling in legs   [] Varicose veins   [x] Non-healing ulcers Pulmonary:   [] Uses home oxygen   [] Productive cough   [] Hemoptysis   [] Wheeze  [] COPD   [] Asthma Neurologic:  [] Dizziness   [] Seizures   [] History of stroke   [] History of TIA  [] Aphasia   [] Vissual changes   [] Weakness or numbness in arm   [] Weakness or numbness in leg Musculoskeletal:   [] Joint swelling   []   Joint pain   [] Low back pain Hematologic:  [] Easy bruising  [] Easy bleeding   [] Hypercoagulable state   [] Anemic Gastrointestinal:  [] Diarrhea   [] Vomiting  [] Gastroesophageal reflux/heartburn   [] Difficulty swallowing. Genitourinary:  [] Chronic kidney disease   [] Difficult urination  [] Frequent urination   [] Blood in urine Skin:  [x] Rashes   [x] Ulcers  Psychological:  [] History of anxiety   []  History of major depression.  Physical Examination  Vitals:   11/14/19 0949  BP: 134/77  Pulse: (!) 101  Weight: (!) 350 lb (158.8 kg)  Height: 6\' 1"  (1.854 m)   Body mass index is 46.18 kg/m. Gen: WD/WN, NAD Head: [place]/AT, No temporalis wasting.  Ear/Nose/Throat: Hearing grossly intact, nares w/o erythema or drainage Eyes: PER, EOMI,  sclera nonicteric.  Neck: Supple, no large masses.   Pulmonary:  Good air movement, no audible wheezing bilaterally, no use of accessory muscles.  Cardiac: RRR, no JVD Vascular: scattered varicosities present bilaterally.  Severe venous stasis changes to the legs bilaterally.  4+ soft pitting edema, excoriation of the medial left ankle Vessel Right Left  Radial Palpable Palpable  Gastrointestinal: Non-distended. No guarding/no peritoneal signs.  Musculoskeletal: M/S 5/5 throughout.  No deformity or atrophy.  Neurologic: CN 2-12 intact. Symmetrical.  Speech is fluent. Motor exam as listed above. Psychiatric: Judgment intact, Mood & affect appropriate for pt's clinical situation. Dermatologic: Severe venous rashes with left ulcers noted.  No changes consistent with cellulitis. Lymph : + lichenification and skin changes of chronic lymphedema.  CBC No results found for: WBC, HGB, HCT, MCV, PLT  BMET No results found for: NA, K, CL, CO2, GLUCOSE, BUN, CREATININE, CALCIUM, GFRNONAA, GFRAA CrCl cannot be calculated (No successful lab value found.).  COAG No results found for: INR, PROTIME  Radiology No results found.   Assessment/Plan 1. Chronic venous insufficiency No surgery or intervention at this point in time.    I have had a long discussion with the patient regarding venous insufficiency and why it  causes symptoms, specifically venous ulceration . I have discussed with the patient the chronic skin changes that accompany venous insufficiency and the long term sequela such as infection and recurring  ulceration.  Patient will be placed in [employer] which will be changed every two weeks at the patient's request, drainage permitting.  In addition, behavioral modification including several periods of elevation of the lower extremities during the day will be continued. Achieving a position with the ankles at heart level was stressed to the patient  The patient is instructed to begin  routine exercise, especially walking on a daily basis  Patient should undergo duplex ultrasound of the venous system to ensure that DVT or reflux is not present.  Following the review of the ultrasound the patient will follow up in one week to reassess the degree of swelling and the control that Unna therapy is offering.   The patient should continue Lymph Pump bid  2. Venous ulcer ([place]) No surgery or intervention at this point in time.    I have had a long discussion with the patient regarding venous insufficiency and why it  causes symptoms, specifically venous ulceration . I have discussed with the patient the chronic skin changes that accompany venous insufficiency and the long term sequela such as infection and recurring  ulceration.  Patient will be placed in [employer] which will be changed every two weeks at the patient's request, drainage permitting.  In addition, behavioral modification including several periods of elevation of the lower  extremities during the day will be continued. Achieving a position with the ankles at heart level was stressed to the patient  The patient is instructed to begin routine exercise, especially walking on a daily basis  Patient should undergo duplex ultrasound of the venous system to ensure that DVT or reflux is not present.  Following the review of the ultrasound the patient will follow up in one week to reassess the degree of swelling and the control that Unna therapy is offering.   The patient should continue Lymph Pump bid   3. Type 2 diabetes mellitus with diabetic dermatitis, without long-term current use of insulin (HCC) Continue hypoglycemic medications as already ordered, these medications have been reviewed and there are no changes at this time.  Hgb A1C to be monitored as already arranged by primary service   4. Essential hypertension Continue antihypertensive medications as already ordered, these medications have been reviewed and there  are no changes at this time.   5. Mixed hyperlipidemia Continue statin as ordered and reviewed, no changes at this time    Hortencia Pilar, MD  11/14/2019 10:06 AM

## 2019-11-28 ENCOUNTER — Ambulatory Visit (INDEPENDENT_AMBULATORY_CARE_PROVIDER_SITE_OTHER): Payer: PPO | Admitting: Nurse Practitioner

## 2019-11-28 ENCOUNTER — Other Ambulatory Visit: Payer: Self-pay

## 2019-11-28 ENCOUNTER — Encounter (INDEPENDENT_AMBULATORY_CARE_PROVIDER_SITE_OTHER): Payer: Self-pay

## 2019-11-28 VITALS — BP 151/80 | HR 80 | Resp 16 | Wt 324.0 lb

## 2019-11-28 DIAGNOSIS — I83009 Varicose veins of unspecified lower extremity with ulcer of unspecified site: Secondary | ICD-10-CM | POA: Diagnosis not present

## 2019-11-28 DIAGNOSIS — L97909 Non-pressure chronic ulcer of unspecified part of unspecified lower leg with unspecified severity: Secondary | ICD-10-CM | POA: Diagnosis not present

## 2019-11-28 NOTE — Progress Notes (Signed)
History of Present Illness  There is no documented history at this time  Assessments & Plan   There are no diagnoses linked to this encounter.    Additional instructions  Subjective:  Patient presents with venous ulcer of the Bilateral lower extremity.    Procedure:  3 layer unna wrap was placed Bilateral lower extremity.   Plan:   Follow up in one week.  

## 2019-12-09 DIAGNOSIS — E119 Type 2 diabetes mellitus without complications: Secondary | ICD-10-CM | POA: Diagnosis not present

## 2019-12-12 ENCOUNTER — Ambulatory Visit (INDEPENDENT_AMBULATORY_CARE_PROVIDER_SITE_OTHER): Payer: PPO | Admitting: Nurse Practitioner

## 2019-12-12 ENCOUNTER — Encounter (INDEPENDENT_AMBULATORY_CARE_PROVIDER_SITE_OTHER): Payer: Self-pay | Admitting: Nurse Practitioner

## 2019-12-12 ENCOUNTER — Other Ambulatory Visit: Payer: Self-pay

## 2019-12-12 VITALS — BP 128/72 | HR 66 | Resp 16 | Wt 353.0 lb

## 2019-12-12 DIAGNOSIS — I890 Lymphedema, not elsewhere classified: Secondary | ICD-10-CM

## 2019-12-12 DIAGNOSIS — L97909 Non-pressure chronic ulcer of unspecified part of unspecified lower leg with unspecified severity: Secondary | ICD-10-CM

## 2019-12-12 DIAGNOSIS — I10 Essential (primary) hypertension: Secondary | ICD-10-CM

## 2019-12-12 DIAGNOSIS — I83009 Varicose veins of unspecified lower extremity with ulcer of unspecified site: Secondary | ICD-10-CM | POA: Diagnosis not present

## 2019-12-12 NOTE — Progress Notes (Signed)
Subjective:    Patient ID: [name], male    DOB: 1950-11-20, 69 y.o.   MRN: [id number] Chief Complaint  Patient presents with   Follow-up    unna check    HPI  Review of Systems     Objective:   Physical Exam  BP 128/72 (BP Location: Right Arm)    Pulse 66    Resp 16    Wt (!) 353 lb (160.1 kg)    BMI 46.57 kg/m   Past Medical History:  Diagnosis Date   Diabetes mellitus without complication (HCC)    Hyperlipidemia    Hypertension    Leukemia ([place])     Social History   Socioeconomic History   Marital status: Divorced    Spouse name: Not on file   Number of children: Not on file   Years of education: Not on file   Highest education level: Not on file  Occupational History   Not on file  Tobacco Use   Smoking status: Former Smoker   Smokeless tobacco: Never Used  Substance and Sexual Activity   Alcohol use: No   Drug use: No   Sexual activity: Not on file  Other Topics Concern   Not on file  Social History Narrative   Not on file   Social Determinants of Health   Financial Resource Strain:    Difficulty of Paying Living Expenses:   Food Insecurity:    Worried About Charity fundraiser in the Last Year:    Arboriculturist in the Last Year:   Transportation Needs:    Film/video editor (Medical):    Lack of Transportation (Non-Medical):   Physical Activity:    Days of Exercise per Week:    Minutes of Exercise per Session:   Stress:    Feeling of Stress :   Social Connections:    Frequency of Communication with Friends and Family:    Frequency of Social Gatherings with Friends and Family:    Attends Religious Services:    Active Member of Clubs or Organizations:    Attends Music therapist:    Marital Status:   Intimate Partner Violence:    Fear of Current or Ex-Partner:    Emotionally Abused:    Physically Abused:    Sexually Abused:     Past Surgical History:  Procedure Laterality  Date   CATARACT EXTRACTION Right    TONSILLECTOMY     TOOTH EXTRACTION      Family History  Problem Relation Age of Onset   Cancer Mother    Stroke Father    Cancer Sister     No Known Allergies     Assessment & Plan:   1. Venous ulcer ([place]) The patient has bilateral ulcerations.  The patient ulceration on the right lower extremity appears better than the ulceration on the left.  The patient's ulceration on the left is a little bit more fluid and boggy.  The patient is unable to present to the office on a weekly basis for wrap changes and there is concern that due to continued wraps the patient may suffer skin maceration as well as worsening infection.  We will try to obtain home health with the patient.  2. Lymphedema Patient will continue with bilateral Unna wraps changed on a weekly basis.  We will reevaluate his progress in 4 weeks.  He is also advised to continue with elevation and exercise as well as aggressive use of his lymphedema  pump.  3. Essential hypertension Continue antihypertensive medications as already ordered, these medications have been reviewed and there are no changes at this time.    Current Outpatient Medications on File Prior to Visit  Medication Sig Dispense Refill   bisoprolol (ZEBETA) 5 MG tablet Take by mouth.     bisoprolol-hydrochlorothiazide (ZIAC) 10-6.25 MG tablet Take 1 tablet by mouth daily.     furosemide (LASIX) 20 MG tablet Take 20 mg by mouth.     glimepiride (AMARYL) 4 MG tablet      hydrALAZINE (APRESOLINE) 10 MG tablet Take 10 mg by mouth 3 (three) times daily.     lisinopril (ZESTRIL) 20 MG tablet Take by mouth.     lisinopril-hydrochlorothiazide (PRINZIDE,ZESTORETIC) 20-12.5 MG tablet Take 1 tablet by mouth daily.     metFORMIN (GLUCOPHAGE) 1000 MG tablet Take 1,000 mg by mouth 2 (two) times daily with a meal.     pioglitazone (ACTOS) 15 MG tablet Take 15 mg by mouth daily.     pravastatin (PRAVACHOL) 80 MG tablet  Take 80 mg by mouth daily.     sertraline (ZOLOFT) 25 MG tablet Take 25 mg by mouth daily.     doxycycline (VIBRAMYCIN) 100 MG capsule Take 1 capsule (100 mg total) by mouth 2 (two) times daily. (Patient not taking: Reported on 12/12/2019) 14 capsule 0   No current facility-administered medications on file prior to visit.    There are no Patient Instructions on file for this visit. No follow-ups on file.   Kris Hartmann, NP

## 2019-12-19 ENCOUNTER — Encounter (INDEPENDENT_AMBULATORY_CARE_PROVIDER_SITE_OTHER): Payer: Self-pay | Admitting: Nurse Practitioner

## 2019-12-19 ENCOUNTER — Ambulatory Visit (INDEPENDENT_AMBULATORY_CARE_PROVIDER_SITE_OTHER): Payer: PPO | Admitting: Nurse Practitioner

## 2019-12-19 ENCOUNTER — Other Ambulatory Visit: Payer: Self-pay

## 2019-12-19 VITALS — BP 150/78 | HR 56 | Ht 73.0 in | Wt 352.0 lb

## 2019-12-19 DIAGNOSIS — L97909 Non-pressure chronic ulcer of unspecified part of unspecified lower leg with unspecified severity: Secondary | ICD-10-CM

## 2019-12-19 DIAGNOSIS — I83009 Varicose veins of unspecified lower extremity with ulcer of unspecified site: Secondary | ICD-10-CM

## 2019-12-19 NOTE — Progress Notes (Signed)
History of Present Illness  There is no documented history at this time  Assessments & Plan   There are no diagnoses linked to this encounter.    Additional instructions  Subjective:  Patient presents with venous ulcer of the Bilateral lower extremity.    Procedure:  3 layer unna wrap was placed Bilateral lower extremity.   Plan:   Follow up in one week.  

## 2019-12-25 ENCOUNTER — Encounter (INDEPENDENT_AMBULATORY_CARE_PROVIDER_SITE_OTHER): Payer: Self-pay

## 2019-12-26 ENCOUNTER — Encounter (INDEPENDENT_AMBULATORY_CARE_PROVIDER_SITE_OTHER): Payer: PPO

## 2019-12-26 DIAGNOSIS — I10 Essential (primary) hypertension: Secondary | ICD-10-CM | POA: Diagnosis not present

## 2019-12-26 DIAGNOSIS — Z792 Long term (current) use of antibiotics: Secondary | ICD-10-CM | POA: Diagnosis not present

## 2019-12-26 DIAGNOSIS — Z87891 Personal history of nicotine dependence: Secondary | ICD-10-CM | POA: Diagnosis not present

## 2019-12-26 DIAGNOSIS — Z9181 History of falling: Secondary | ICD-10-CM | POA: Diagnosis not present

## 2019-12-26 DIAGNOSIS — Z7984 Long term (current) use of oral hypoglycemic drugs: Secondary | ICD-10-CM | POA: Diagnosis not present

## 2019-12-26 DIAGNOSIS — C9590 Leukemia, unspecified not having achieved remission: Secondary | ICD-10-CM | POA: Diagnosis not present

## 2019-12-26 DIAGNOSIS — I872 Venous insufficiency (chronic) (peripheral): Secondary | ICD-10-CM | POA: Diagnosis not present

## 2019-12-26 DIAGNOSIS — E119 Type 2 diabetes mellitus without complications: Secondary | ICD-10-CM | POA: Diagnosis not present

## 2019-12-26 DIAGNOSIS — E785 Hyperlipidemia, unspecified: Secondary | ICD-10-CM | POA: Diagnosis not present

## 2019-12-26 DIAGNOSIS — I890 Lymphedema, not elsewhere classified: Secondary | ICD-10-CM | POA: Diagnosis not present

## 2020-01-02 ENCOUNTER — Ambulatory Visit (INDEPENDENT_AMBULATORY_CARE_PROVIDER_SITE_OTHER): Payer: PPO | Admitting: Vascular Surgery

## 2020-01-02 ENCOUNTER — Encounter (INDEPENDENT_AMBULATORY_CARE_PROVIDER_SITE_OTHER): Payer: Self-pay | Admitting: Vascular Surgery

## 2020-01-02 ENCOUNTER — Other Ambulatory Visit: Payer: Self-pay

## 2020-01-02 VITALS — BP 114/70 | HR 60 | Ht 73.0 in | Wt 351.0 lb

## 2020-01-02 DIAGNOSIS — I890 Lymphedema, not elsewhere classified: Secondary | ICD-10-CM | POA: Diagnosis not present

## 2020-01-02 DIAGNOSIS — L97818 Non-pressure chronic ulcer of other part of right lower leg with other specified severity: Secondary | ICD-10-CM | POA: Diagnosis not present

## 2020-01-02 DIAGNOSIS — I83023 Varicose veins of left lower extremity with ulcer of ankle: Secondary | ICD-10-CM | POA: Diagnosis not present

## 2020-01-02 DIAGNOSIS — I83009 Varicose veins of unspecified lower extremity with ulcer of unspecified site: Secondary | ICD-10-CM | POA: Diagnosis not present

## 2020-01-02 DIAGNOSIS — E782 Mixed hyperlipidemia: Secondary | ICD-10-CM | POA: Diagnosis not present

## 2020-01-02 DIAGNOSIS — L97909 Non-pressure chronic ulcer of unspecified part of unspecified lower leg with unspecified severity: Secondary | ICD-10-CM | POA: Diagnosis not present

## 2020-01-02 DIAGNOSIS — I872 Venous insufficiency (chronic) (peripheral): Secondary | ICD-10-CM

## 2020-01-02 DIAGNOSIS — I10 Essential (primary) hypertension: Secondary | ICD-10-CM

## 2020-01-02 NOTE — Progress Notes (Signed)
MRN : [id number]  [name] is a 69 y.o. (February 15, 1951) male who presents with chief complaint of  Chief Complaint  Patient presents with  . Follow-up    BIL unna boot check  .  History of Present Illness:  The patient presents today for Unna wrap check. The patient continues to have excessive pedal edema however it is slightly decreased from his last visit. The patient does not have any blisters however he continues to have a shallow ulceration of the left medial ankle, the right  lower extremity is improved. 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 The patient is able to utilize his lymphedema pump two time a day but doesn't feel it is much help. He is adherent with his Unna wraps. The patient is somewhat active. He denies any fever, chills or syncope. The patient's left lower extremity is somewhat concerning for cellulitis as he has had some increased redness and discomfort.  No outpatient medications have been marked as taking for the 01/02/20 encounter (Office Visit) with Delana Meyer, Dolores Lory, MD.    Past Medical History:  Diagnosis Date  . Diabetes mellitus without complication ([place])   . Hyperlipidemia   . Hypertension   . Leukemia [facility])     Past Surgical History:  Procedure Laterality Date  . CATARACT EXTRACTION Right   . TONSILLECTOMY    . TOOTH EXTRACTION      Social History Social History   Tobacco Use  . Smoking status: Former Research scientist (life sciences)  . Smokeless tobacco: Never Used  Substance Use Topics  . Alcohol use: No  . Drug use: No    Family History Family History  Problem Relation Age of Onset  . Cancer Mother   . Stroke Father   . Cancer Sister     No Known Allergies   REVIEW OF SYSTEMS (Negative unless checked)  Constitutional: [] Weight loss  [] Fever  [] Chills Cardiac: [] Chest pain   [] Chest pressure   [] Palpitations   [] Shortness of breath when laying flat    [] Shortness of breath with exertion. Vascular:  [] Pain in legs with walking   [] Pain in legs at rest  [] History of DVT   [] Phlebitis   [x] Swelling in legs   [] Varicose veins   [x] Healing ulcers Pulmonary:   [] Uses home oxygen   [] Productive cough   [] Hemoptysis   [] Wheeze  [] COPD   [] Asthma Neurologic:  [] Dizziness   [] Seizures   [] History of stroke   [] History of TIA  [] Aphasia   [] Vissual changes   [] Weakness or numbness in arm   [] Weakness or numbness in leg Musculoskeletal:   [] Joint swelling   [] Joint pain   [] Low back pain Hematologic:  [] Easy bruising  [] Easy bleeding   [] Hypercoagulable state   [] Anemic Gastrointestinal:  [] Diarrhea   [] Vomiting  [] Gastroesophageal reflux/heartburn   [] Difficulty swallowing. Genitourinary:  [] Chronic kidney disease   [] Difficult urination  [] Frequent urination   [] Blood in urine Skin:  [x] Rashes   [x] Ulcers  Psychological:  [] History of anxiety   []  History of major depression.  Physical Examination  Vitals:   01/02/20 0859  BP: 114/70  Pulse: 60  Weight: (!) 351 lb (159.2 kg)  Height: 6\' 1"  (1.854 m)   Body mass index is 46.31 kg/m. Gen: WD/WN, NAD Head: [place]/AT, No temporalis wasting.  Ear/Nose/Throat: Hearing grossly intact, nares w/o erythema or drainage Eyes: PER, EOMI, sclera nonicteric.  Neck: Supple, no  large masses.   Pulmonary:  Good air movement, no audible wheezing bilaterally, no use of accessory muscles.  Cardiac: RRR, no JVD Vascular: 2-3+ edema bilaterally with severe venous changes bilaterally.  Venous ulcers noted in the ankle area bilaterally, noninfected Vessel Right Left  Radial Palpable Palpable  Gastrointestinal: Non-distended. No guarding/no peritoneal signs.  Musculoskeletal: M/S 5/5 throughout.  No deformity or atrophy.  Neurologic: CN 2-12 intact. Symmetrical.  Speech is fluent. Motor exam as listed above. Psychiatric: Judgment intact, Mood & affect appropriate for pt's clinical situation. Dermatologic: Venous  stasis dermatitis with ulcers present.  No changes consistent with cellulitis. Lymph : Severe lichenification and skin changes of chronic lymphedema.  CBC No results found for: WBC, HGB, HCT, MCV, PLT  BMET No results found for: NA, K, CL, CO2, GLUCOSE, BUN, CREATININE, CALCIUM, GFRNONAA, GFRAA CrCl cannot be calculated (No successful lab value found.).  COAG No results found for: INR, PROTIME  Radiology No results found.   Assessment/Plan 1. Venous ulcer ([place]) No surgery or intervention at this point in time.    I have had a long discussion with the patient regarding venous insufficiency and why it  causes symptoms, specifically venous ulceration . I have discussed with the patient the chronic skin changes that accompany venous insufficiency and the long term sequela such as infection and recurring  ulceration.  Patient will be placed in [employer] which will be changed weekly drainage permitting.  In addition, behavioral modification including several periods of elevation of the lower extremities during the day will be continued. Achieving a position with the ankles at heart level was stressed to the patient  The patient is instructed to begin routine exercise, especially walking on a daily basis  Following the review of the ultrasound the patient will follow up in one month to reassess the degree of swelling and the control that Unna therapy is offering.   The patient can be assessed for graduated compression stockings or wraps as well as a Lymph Pump once the ulcers are healed.   2. Chronic venous insufficiency See #1  3. Essential hypertension Continue antihypertensive medications as already ordered, these medications have been reviewed and there are no changes at this time.   4. Mixed hyperlipidemia Continue statin as ordered and reviewed, no changes at this time    Hortencia Pilar, MD  01/02/2020 9:00 AM

## 2020-01-09 ENCOUNTER — Encounter (INDEPENDENT_AMBULATORY_CARE_PROVIDER_SITE_OTHER): Payer: PPO

## 2020-01-16 ENCOUNTER — Encounter (INDEPENDENT_AMBULATORY_CARE_PROVIDER_SITE_OTHER): Payer: PPO

## 2020-01-16 DIAGNOSIS — Z9181 History of falling: Secondary | ICD-10-CM | POA: Diagnosis not present

## 2020-01-16 DIAGNOSIS — I890 Lymphedema, not elsewhere classified: Secondary | ICD-10-CM | POA: Diagnosis not present

## 2020-01-16 DIAGNOSIS — Z792 Long term (current) use of antibiotics: Secondary | ICD-10-CM | POA: Diagnosis not present

## 2020-01-16 DIAGNOSIS — E119 Type 2 diabetes mellitus without complications: Secondary | ICD-10-CM | POA: Diagnosis not present

## 2020-01-16 DIAGNOSIS — C9590 Leukemia, unspecified not having achieved remission: Secondary | ICD-10-CM | POA: Diagnosis not present

## 2020-01-16 DIAGNOSIS — E785 Hyperlipidemia, unspecified: Secondary | ICD-10-CM | POA: Diagnosis not present

## 2020-01-16 DIAGNOSIS — Z7984 Long term (current) use of oral hypoglycemic drugs: Secondary | ICD-10-CM | POA: Diagnosis not present

## 2020-01-16 DIAGNOSIS — I872 Venous insufficiency (chronic) (peripheral): Secondary | ICD-10-CM | POA: Diagnosis not present

## 2020-01-16 DIAGNOSIS — I10 Essential (primary) hypertension: Secondary | ICD-10-CM | POA: Diagnosis not present

## 2020-01-16 DIAGNOSIS — Z87891 Personal history of nicotine dependence: Secondary | ICD-10-CM | POA: Diagnosis not present

## 2020-01-23 ENCOUNTER — Ambulatory Visit: Payer: PPO | Admitting: Podiatry

## 2020-01-23 ENCOUNTER — Encounter: Payer: Self-pay | Admitting: Podiatry

## 2020-01-23 ENCOUNTER — Encounter (INDEPENDENT_AMBULATORY_CARE_PROVIDER_SITE_OTHER): Payer: Self-pay | Admitting: Nurse Practitioner

## 2020-01-23 ENCOUNTER — Other Ambulatory Visit: Payer: Self-pay

## 2020-01-23 ENCOUNTER — Ambulatory Visit (INDEPENDENT_AMBULATORY_CARE_PROVIDER_SITE_OTHER): Payer: PPO | Admitting: Nurse Practitioner

## 2020-01-23 VITALS — BP 131/70 | HR 73 | Resp 16 | Wt 351.0 lb

## 2020-01-23 DIAGNOSIS — L97909 Non-pressure chronic ulcer of unspecified part of unspecified lower leg with unspecified severity: Secondary | ICD-10-CM | POA: Diagnosis not present

## 2020-01-23 DIAGNOSIS — I10 Essential (primary) hypertension: Secondary | ICD-10-CM

## 2020-01-23 DIAGNOSIS — B351 Tinea unguium: Secondary | ICD-10-CM

## 2020-01-23 DIAGNOSIS — M79675 Pain in left toe(s): Secondary | ICD-10-CM

## 2020-01-23 DIAGNOSIS — I872 Venous insufficiency (chronic) (peripheral): Secondary | ICD-10-CM

## 2020-01-23 DIAGNOSIS — I890 Lymphedema, not elsewhere classified: Secondary | ICD-10-CM | POA: Diagnosis not present

## 2020-01-23 DIAGNOSIS — E782 Mixed hyperlipidemia: Secondary | ICD-10-CM | POA: Diagnosis not present

## 2020-01-23 DIAGNOSIS — E11628 Type 2 diabetes mellitus with other skin complications: Secondary | ICD-10-CM | POA: Diagnosis not present

## 2020-01-23 DIAGNOSIS — I83009 Varicose veins of unspecified lower extremity with ulcer of unspecified site: Secondary | ICD-10-CM

## 2020-01-23 DIAGNOSIS — M79674 Pain in right toe(s): Secondary | ICD-10-CM | POA: Diagnosis not present

## 2020-01-23 NOTE — Progress Notes (Signed)
This patient returns to my office for at risk foot care.  This patient requires this care by a professional since this patient will be at risk due to having diabetes and venous insufficiency.  This patient is unable to cut nails himself since the patient cannot reach his nails.These nails are painful walking and wearing shoes.  Patient is wearing unna boots  B/L. This patient presents for at risk foot care today.  General Appearance  Alert, conversant and in no acute stress.  Vascular  Deferred due to unna boots.  Neurologic  Deferred due to unna boots.  Nails Thick disfigured discolored nails with subungual debris  from hallux to fifth toes bilaterally. No evidence of bacterial infection or drainage bilaterally.  Orthopedic  No limitations of motion  feet .  No crepitus or effusions noted.  No bony pathology or digital deformities noted.  Skin  normotropic skin with no porokeratosis noted bilaterally.  No signs of infections or ulcers noted.     Onychomycosis  Pain in right toes  Pain in left toes  Consent was obtained for treatment procedures.   Mechanical debridement of nails 1-5  bilaterally performed with a nail nipper.  Filed with dremel without incident.    Return office visit    3 months                 Told patient to return for periodic foot care and evaluation due to potential at risk complications.   Esli Clements DPM  

## 2020-01-30 ENCOUNTER — Encounter (INDEPENDENT_AMBULATORY_CARE_PROVIDER_SITE_OTHER): Payer: Self-pay | Admitting: Nurse Practitioner

## 2020-01-30 NOTE — Progress Notes (Signed)
Subjective:    Patient ID: [name], male    DOB: [birth year], 70 y.o.   MRN: [id number] Chief Complaint  Patient presents with  . Follow-up    4 week unna check    The patient presents today for Unna wrap check. The patient continues to have excessive pedal edema however it is slightly decreased from his last visit.  The patient has 3 ulcerations that have been treated by home health.  He is tolerating these wraps well.  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  The patient was previously treated for cellulitis which has resolved at this time.  He does continue to utilize his lymphedema pump as much as possible.  He denies any fever, chills, nausea, vomiting or diarrhea.     Review of Systems  Cardiovascular: Positive for leg swelling.  Skin: Positive for wound.       Objective:   Physical Exam Vitals reviewed.  HENT:     Head: Normocephalic.  Cardiovascular:     Rate and Rhythm: Normal rate.     Pulses: Normal pulses.  Pulmonary:     Effort: Pulmonary effort is normal.  Musculoskeletal:     Right lower leg: 3+ Edema present.     Left lower leg: 3+ Edema present.  Skin:    General: Skin is warm and dry.  Neurological:     Mental Status: He is alert and oriented to person, place, and time.  Psychiatric:        Mood and Affect: Mood normal.        Behavior: Behavior normal.        Thought Content: Thought content normal.        Judgment: Judgment normal.     BP 131/70 (BP Location: Right Arm)   Pulse 73   Resp 16   Wt (!) 351 lb (159.2 kg)   BMI 46.31 kg/m   Past Medical History:  Diagnosis Date  . Diabetes mellitus without complication ([place])   . Hyperlipidemia   . Hypertension   . Leukemia [facility])     Social History   Socioeconomic History  . Marital status: Divorced    Spouse name: Not on file  . Number of children: Not on file  . Years of education: Not on file  . Highest  education level: Not on file  Occupational History  . Not on file  Tobacco Use  . Smoking status: Former Research scientist (life sciences)  . Smokeless tobacco: Never Used  Substance and Sexual Activity  . Alcohol use: No  . Drug use: No  . Sexual activity: Not on file  Other Topics Concern  . Not on file  Social History Narrative  . Not on file   Social Determinants of Health   Financial Resource Strain:   . Difficulty of Paying Living Expenses: Not on file  Food Insecurity:   . Worried About Charity fundraiser in the Last Year: Not on file  . Ran Out of Food in the Last Year: Not on file  Transportation Needs:   . Lack of Transportation (Medical): Not on file  . Lack of Transportation (Non-Medical): Not on file  Physical Activity:   . Days of Exercise per Week: Not on file  . Minutes of Exercise per Session: Not on file  Stress:   . Feeling of Stress : Not on file  Social Connections:   . Frequency of Communication with Friends and  Family: Not on file  . Frequency of Social Gatherings with Friends and Family: Not on file  . Attends Religious Services: Not on file  . Active Member of Clubs or Organizations: Not on file  . Attends Archivist Meetings: Not on file  . Marital Status: Not on file  Intimate Partner Violence:   . Fear of Current or Ex-Partner: Not on file  . Emotionally Abused: Not on file  . Physically Abused: Not on file  . Sexually Abused: Not on file    Past Surgical History:  Procedure Laterality Date  . CATARACT EXTRACTION Right   . TONSILLECTOMY    . TOOTH EXTRACTION      Family History  Problem Relation Age of Onset  . Cancer Mother   . Stroke Father   . Cancer Sister     No Known Allergies     Assessment & Plan:   1. Venous ulcer ([place]) No surgery or intervention at this point in time.    I have had a long discussion with the patient regarding venous insufficiency and why it  causes symptoms, specifically venous ulceration . I have discussed with  the patient the chronic skin changes that accompany venous insufficiency and the long term sequela such as infection and recurring  ulceration.  Patient will be placed in [employer] which will be changed weekly drainage permitting.  In addition, behavioral modification including several periods of elevation of the lower extremities during the day will be continued. Achieving a position with the ankles at heart level was stressed to the patient  The patient is instructed to begin routine exercise, especially walking on a daily basis  Patient should undergo duplex ultrasound of the venous system to ensure that DVT or reflux is not present.  Following the review of the ultrasound the patient will follow up in four weeks to reassess the degree of swelling and the control that Unna therapy is offering.      2. Lymphedema Patient will continue with bilateral Unna wraps changed on a weekly basis.  We will reevaluate his progress in 4 weeks.  He is also advised to continue with elevation and exercise as well as aggressive use of his lymphedema pump.  3. Essential hypertension Continue antihypertensive medications as already ordered, these medications have been reviewed and there are no changes at this time.   4. Mixed hyperlipidemia Continue statin as ordered and reviewed, no changes at this time    Current Outpatient Medications on File Prior to Visit  Medication Sig Dispense Refill  . bisoprolol-hydrochlorothiazide (ZIAC) 10-6.25 MG tablet Take 1 tablet by mouth daily.    . furosemide (LASIX) 20 MG tablet Take 20 mg by mouth.    Marland Kitchen glimepiride (AMARYL) 4 MG tablet     . hydrALAZINE (APRESOLINE) 10 MG tablet Take 10 mg by mouth 3 (three) times daily.    Marland Kitchen lisinopril-hydrochlorothiazide (PRINZIDE,ZESTORETIC) 20-12.5 MG tablet Take 1 tablet by mouth daily.    . metFORMIN (GLUCOPHAGE) 1000 MG tablet Take 1,000 mg by mouth 2 (two) times daily with a meal.    . pioglitazone (ACTOS) 15 MG tablet  Take 15 mg by mouth daily.    . pravastatin (PRAVACHOL) 80 MG tablet Take 40 mg by mouth daily. daily    . sertraline (ZOLOFT) 100 MG tablet Take 100 mg by mouth daily.      No current facility-administered medications on file prior to visit.    There are no Patient Instructions on file for  this visit. No follow-ups on file.   Kris Hartmann, NP

## 2020-02-07 DIAGNOSIS — C9590 Leukemia, unspecified not having achieved remission: Secondary | ICD-10-CM | POA: Diagnosis not present

## 2020-02-07 DIAGNOSIS — I890 Lymphedema, not elsewhere classified: Secondary | ICD-10-CM | POA: Diagnosis not present

## 2020-02-07 DIAGNOSIS — Z87891 Personal history of nicotine dependence: Secondary | ICD-10-CM | POA: Diagnosis not present

## 2020-02-07 DIAGNOSIS — E119 Type 2 diabetes mellitus without complications: Secondary | ICD-10-CM | POA: Diagnosis not present

## 2020-02-07 DIAGNOSIS — I10 Essential (primary) hypertension: Secondary | ICD-10-CM | POA: Diagnosis not present

## 2020-02-07 DIAGNOSIS — Z9181 History of falling: Secondary | ICD-10-CM | POA: Diagnosis not present

## 2020-02-07 DIAGNOSIS — E785 Hyperlipidemia, unspecified: Secondary | ICD-10-CM | POA: Diagnosis not present

## 2020-02-07 DIAGNOSIS — Z792 Long term (current) use of antibiotics: Secondary | ICD-10-CM | POA: Diagnosis not present

## 2020-02-07 DIAGNOSIS — I872 Venous insufficiency (chronic) (peripheral): Secondary | ICD-10-CM | POA: Diagnosis not present

## 2020-02-07 DIAGNOSIS — Z7984 Long term (current) use of oral hypoglycemic drugs: Secondary | ICD-10-CM | POA: Diagnosis not present

## 2020-02-20 ENCOUNTER — Ambulatory Visit (INDEPENDENT_AMBULATORY_CARE_PROVIDER_SITE_OTHER): Payer: PPO | Admitting: Vascular Surgery

## 2020-02-20 ENCOUNTER — Other Ambulatory Visit: Payer: Self-pay

## 2020-02-20 ENCOUNTER — Encounter (INDEPENDENT_AMBULATORY_CARE_PROVIDER_SITE_OTHER): Payer: Self-pay | Admitting: Vascular Surgery

## 2020-02-20 VITALS — BP 145/75 | HR 61 | Resp 16 | Wt 346.6 lb

## 2020-02-20 DIAGNOSIS — I10 Essential (primary) hypertension: Secondary | ICD-10-CM

## 2020-02-20 DIAGNOSIS — I872 Venous insufficiency (chronic) (peripheral): Secondary | ICD-10-CM | POA: Diagnosis not present

## 2020-02-20 DIAGNOSIS — I890 Lymphedema, not elsewhere classified: Secondary | ICD-10-CM | POA: Diagnosis not present

## 2020-02-20 DIAGNOSIS — I83009 Varicose veins of unspecified lower extremity with ulcer of unspecified site: Secondary | ICD-10-CM

## 2020-02-20 DIAGNOSIS — L97909 Non-pressure chronic ulcer of unspecified part of unspecified lower leg with unspecified severity: Secondary | ICD-10-CM

## 2020-02-20 NOTE — Progress Notes (Signed)
MRN : [id number]  [name] is a 69 y.o. (Mar 10, 1951) male who presents with chief complaint of  Chief Complaint  Patient presents with  . Follow-up    unna boot check  .  History of Present Illness:   The patientpresents today for Unna wrap check. The patient continues to have excessive pedal edema and although is about the same compared to his last visit the color is much less red and less angry appearing. The patient does not have any blisters however he continues to have a shallow ulcerationof the left medial ankle and the left upper shin.  The rightlower extremity is improved and doesn't have any ulcers. 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 The patient is able to utilize his lymphedema pumptwo time a day but doesn't feel it is much help. He is adherent with his Unna wraps. The patient is somewhat active. He denies any fever, chills or syncope. The patient's left lower extremity is somewhat concerning for cellulitis as he has had some increased redness and discomfort.  Current Meds  Medication Sig  . bisoprolol-hydrochlorothiazide (ZIAC) 10-6.25 MG tablet Take 1 tablet by mouth daily.  . furosemide (LASIX) 20 MG tablet Take 20 mg by mouth.  Marland Kitchen glimepiride (AMARYL) 4 MG tablet   . hydrALAZINE (APRESOLINE) 10 MG tablet Take 10 mg by mouth 3 (three) times daily.  Marland Kitchen lisinopril-hydrochlorothiazide (PRINZIDE,ZESTORETIC) 20-12.5 MG tablet Take 1 tablet by mouth daily.  . metFORMIN (GLUCOPHAGE) 1000 MG tablet Take 1,000 mg by mouth 2 (two) times daily with a meal.  . pioglitazone (ACTOS) 15 MG tablet Take 15 mg by mouth daily.  . pravastatin (PRAVACHOL) 80 MG tablet Take 40 mg by mouth daily. daily  . sertraline (ZOLOFT) 100 MG tablet Take 100 mg by mouth daily.     Past Medical History:  Diagnosis Date  . Diabetes mellitus without complication ([place])   . Hyperlipidemia   .  Hypertension   . Leukemia [facility])     Past Surgical History:  Procedure Laterality Date  . CATARACT EXTRACTION Right   . TONSILLECTOMY    . TOOTH EXTRACTION      Social History Social History   Tobacco Use  . Smoking status: Former Research scientist (life sciences)  . Smokeless tobacco: Never Used  Substance Use Topics  . Alcohol use: No  . Drug use: No    Family History Family History  Problem Relation Age of Onset  . Cancer Mother   . Stroke Father   . Cancer Sister     No Known Allergies   REVIEW OF SYSTEMS (Negative unless checked)  Constitutional: [] Weight loss  [] Fever  [] Chills Cardiac: [] Chest pain   [] Chest pressure   [] Palpitations   [] Shortness of breath when laying flat   [] Shortness of breath with exertion. Vascular:  [] Pain in legs with walking   [x] Pain in legs at rest  [] History of DVT   [] Phlebitis   [x] Swelling in legs   [] Varicose veins   [x] Non-healing ulcers Pulmonary:   [] Uses home oxygen   [] Productive cough   [] Hemoptysis   [] Wheeze  [] COPD   [] Asthma Neurologic:  [] Dizziness   [] Seizures   [] History of stroke   [] History of TIA  [] Aphasia   [] Vissual changes   [] Weakness or numbness in arm   [] Weakness or numbness in leg Musculoskeletal:   [] Joint swelling   [] Joint pain   [] Low back pain  Hematologic:  [] Easy bruising  [] Easy bleeding   [] Hypercoagulable state   [] Anemic Gastrointestinal:  [] Diarrhea   [] Vomiting  [] Gastroesophageal reflux/heartburn   [] Difficulty swallowing. Genitourinary:  [] Chronic kidney disease   [] Difficult urination  [] Frequent urination   [] Blood in urine Skin:  [x] Rashes   [x] Ulcers  Psychological:  [] History of anxiety   []  History of major depression.  Physical Examination  Vitals:   02/20/20 1031  BP: (!) 145/75  Pulse: 61  Resp: 16  Weight: (!) 346 lb 9.6 oz (157.2 kg)   Body mass index is 45.73 kg/m. Gen: WD/WN, NAD Head: [place]/AT, No temporalis wasting.  Ear/Nose/Throat: Hearing grossly intact, nares w/o erythema or drainage Eyes:  PER, EOMI, sclera nonicteric.  Neck: Supple, no large masses.   Pulmonary:  Good air movement, no audible wheezing bilaterally, no use of accessory muscles.  Cardiac: RRR, no JVD Vascular: scattered varicosities present bilaterally.  Severe venous stasis changes to the legs bilaterally.  Massive hard non-pitting edema.  Shallow ulcer proximal shin and a crack like ulcer left medial ankle.  No wounds right leg Gastrointestinal: Non-distended. No guarding/no peritoneal signs.  Musculoskeletal: M/S 5/5 throughout.  No deformity or atrophy.  Neurologic: CN 2-12 intact. Symmetrical.  Speech is fluent. Motor exam as listed above. Psychiatric: Judgment intact, Mood & affect appropriate for pt's clinical situation. Dermatologic: Severe venous rashes with left ulcers noted.  No changes consistent with cellulitis. Lymph : + lichenification  skin changes of chronic lymphedema particularly of the toes.  CBC No results found for: WBC, HGB, HCT, MCV, PLT  BMET No results found for: NA, K, CL, CO2, GLUCOSE, BUN, CREATININE, CALCIUM, GFRNONAA, GFRAA CrCl cannot be calculated (No successful lab value found.).  COAG No results found for: INR, PROTIME  Radiology No results found.    Assessment/Plan 1. Lymphedema  No surgery or intervention at this point in time.    I have reviewed my discussion with the patient regarding lymphedema and why it  causes symptoms.  Patient will continue wearing Unna boots but compression wraps were discussed and a prescription was given. The patient is reminded to put the wraps on first thing in the morning and removing them in the evening. The patient is instructed specifically not to sleep in the wraps.   In addition, behavioral modification throughout the day will be continued.  This will include frequent elevation (such as in a recliner), use of over the counter pain medications as needed and exercise such as walking.  I have reviewed systemic causes for chronic  edema such as liver, kidney and cardiac etiologies and there does not appear to be any significant changes in these organ systems over the past year.  The patient is under the impression that these organ systems are all stable and unchanged.    The patient will continue aggressive use of the  lymph pump.  This will continue to improve the edema control and prevent sequela such as ulcers and infections.   2. Venous ulcer ([place]) No surgery or intervention at this point in time.    I have had a long discussion with the patient regarding venous insufficiency and why it  causes symptoms, specifically venous ulceration . I have discussed with the patient the chronic skin changes that accompany venous insufficiency and the long term sequela such as infection and recurring  ulceration.  Patient will be placed in [employer] which will be changed weekly drainage permitting.  In addition, behavioral modification including several periods of elevation of  the lower extremities during the day will be continued. Achieving a position with the ankles at heart level was stressed to the patient  The patient is instructed to begin routine exercise, especially walking on a daily basis  Patient should undergo duplex ultrasound of the venous system to ensure that DVT or reflux is not present.  Following the review of the ultrasound the patient will follow up in one week to reassess the degree of swelling and the control that Unna therapy is offering.   The patient can be assessed for graduated compression stockings or wraps as well as a Lymph Pump once the ulcers are healed.   3. Chronic venous insufficiency No surgery or intervention at this point in time.    I have had a long discussion with the patient regarding venous insufficiency and why it  causes symptoms, specifically venous ulceration . I have discussed with the patient the chronic skin changes that accompany venous insufficiency and the long term sequela  such as infection and recurring  ulceration.  Patient will be placed in [employer] which will be changed weekly drainage permitting.  In addition, behavioral modification including several periods of elevation of the lower extremities during the day will be continued. Achieving a position with the ankles at heart level was stressed to the patient  The patient is instructed to begin routine exercise, especially walking on a daily basis  Patient should undergo duplex ultrasound of the venous system to ensure that DVT or reflux is not present.  Following the review of the ultrasound the patient will follow up in one week to reassess the degree of swelling and the control that Unna therapy is offering.   The patient can be assessed for graduated compression stockings or wraps as well as a Lymph Pump once the ulcers are healed.   4. Essential hypertension Continue antihypertensive medications as already ordered, these medications have been reviewed and there are no changes at this time.    Hortencia Pilar, MD  02/20/2020 11:10 AM

## 2020-03-04 DIAGNOSIS — E782 Mixed hyperlipidemia: Secondary | ICD-10-CM | POA: Diagnosis not present

## 2020-03-04 DIAGNOSIS — B351 Tinea unguium: Secondary | ICD-10-CM | POA: Diagnosis not present

## 2020-03-04 DIAGNOSIS — Z87891 Personal history of nicotine dependence: Secondary | ICD-10-CM | POA: Diagnosis not present

## 2020-03-04 DIAGNOSIS — I890 Lymphedema, not elsewhere classified: Secondary | ICD-10-CM | POA: Diagnosis not present

## 2020-03-04 DIAGNOSIS — Z9181 History of falling: Secondary | ICD-10-CM | POA: Diagnosis not present

## 2020-03-04 DIAGNOSIS — E1151 Type 2 diabetes mellitus with diabetic peripheral angiopathy without gangrene: Secondary | ICD-10-CM | POA: Diagnosis not present

## 2020-03-04 DIAGNOSIS — I872 Venous insufficiency (chronic) (peripheral): Secondary | ICD-10-CM | POA: Diagnosis not present

## 2020-03-04 DIAGNOSIS — C9590 Leukemia, unspecified not having achieved remission: Secondary | ICD-10-CM | POA: Diagnosis not present

## 2020-03-04 DIAGNOSIS — I10 Essential (primary) hypertension: Secondary | ICD-10-CM | POA: Diagnosis not present

## 2020-03-04 DIAGNOSIS — Z7984 Long term (current) use of oral hypoglycemic drugs: Secondary | ICD-10-CM | POA: Diagnosis not present

## 2020-03-10 DIAGNOSIS — Z7984 Long term (current) use of oral hypoglycemic drugs: Secondary | ICD-10-CM | POA: Diagnosis not present

## 2020-03-10 DIAGNOSIS — B351 Tinea unguium: Secondary | ICD-10-CM | POA: Diagnosis not present

## 2020-03-10 DIAGNOSIS — C9590 Leukemia, unspecified not having achieved remission: Secondary | ICD-10-CM | POA: Diagnosis not present

## 2020-03-10 DIAGNOSIS — Z9181 History of falling: Secondary | ICD-10-CM | POA: Diagnosis not present

## 2020-03-10 DIAGNOSIS — E782 Mixed hyperlipidemia: Secondary | ICD-10-CM | POA: Diagnosis not present

## 2020-03-10 DIAGNOSIS — I10 Essential (primary) hypertension: Secondary | ICD-10-CM | POA: Diagnosis not present

## 2020-03-10 DIAGNOSIS — E1151 Type 2 diabetes mellitus with diabetic peripheral angiopathy without gangrene: Secondary | ICD-10-CM | POA: Diagnosis not present

## 2020-03-10 DIAGNOSIS — Z87891 Personal history of nicotine dependence: Secondary | ICD-10-CM | POA: Diagnosis not present

## 2020-03-10 DIAGNOSIS — I872 Venous insufficiency (chronic) (peripheral): Secondary | ICD-10-CM | POA: Diagnosis not present

## 2020-03-10 DIAGNOSIS — I890 Lymphedema, not elsewhere classified: Secondary | ICD-10-CM | POA: Diagnosis not present

## 2020-03-19 ENCOUNTER — Ambulatory Visit (INDEPENDENT_AMBULATORY_CARE_PROVIDER_SITE_OTHER): Payer: PPO | Admitting: Nurse Practitioner

## 2020-03-19 ENCOUNTER — Encounter (INDEPENDENT_AMBULATORY_CARE_PROVIDER_SITE_OTHER): Payer: Self-pay | Admitting: Nurse Practitioner

## 2020-03-19 ENCOUNTER — Other Ambulatory Visit: Payer: Self-pay

## 2020-03-19 VITALS — BP 139/67 | HR 71 | Resp 16 | Wt 344.0 lb

## 2020-03-19 DIAGNOSIS — I890 Lymphedema, not elsewhere classified: Secondary | ICD-10-CM

## 2020-03-19 DIAGNOSIS — I83009 Varicose veins of unspecified lower extremity with ulcer of unspecified site: Secondary | ICD-10-CM

## 2020-03-19 DIAGNOSIS — L97909 Non-pressure chronic ulcer of unspecified part of unspecified lower leg with unspecified severity: Secondary | ICD-10-CM | POA: Diagnosis not present

## 2020-03-19 DIAGNOSIS — I10 Essential (primary) hypertension: Secondary | ICD-10-CM

## 2020-03-26 ENCOUNTER — Encounter (INDEPENDENT_AMBULATORY_CARE_PROVIDER_SITE_OTHER): Payer: Self-pay | Admitting: Nurse Practitioner

## 2020-03-26 NOTE — Progress Notes (Signed)
Subjective:    Patient ID: [name], male    DOB: Mar 04, 1951, 69 y.o.   MRN: [id number] Chief Complaint  Patient presents with  . Follow-up    4wk unna boot check     The patient presents today for Unna wrap check. The patient continues to have excessive pedal edema however it is slightly worse from his last visit.  The venous ulcerations are healing well although there's an area on the medial right surface.  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  The patient was previously treated for cellulitis which has resolved at this time.  He does continue to utilize his lymphedema pump as much as possible.  He denies any fever, chills, nausea, vomiting or diarrhea.   Review of Systems  Cardiovascular: Positive for leg swelling.  Skin: Positive for wound.       Objective:   Physical Exam Vitals reviewed.  HENT:     Head: Normocephalic.  Cardiovascular:     Rate and Rhythm: Normal rate and regular rhythm.     Pulses: Normal pulses.  Pulmonary:     Effort: Pulmonary effort is normal.  Musculoskeletal:     Right lower leg: 3+ Edema present.     Left lower leg: 3+ Edema present.  Neurological:     Mental Status: He is alert and oriented to person, place, and time.  Psychiatric:        Mood and Affect: Mood normal.        Behavior: Behavior normal.        Thought Content: Thought content normal.        Judgment: Judgment normal.     BP 139/67 (BP Location: Right Arm)   Pulse 71   Resp 16   Wt (!) 344 lb (156 kg)   BMI 45.39 kg/m   Past Medical History:  Diagnosis Date  . Diabetes mellitus without complication ([place])   . Hyperlipidemia   . Hypertension   . Leukemia [facility])     Social History   Socioeconomic History  . Marital status: Divorced    Spouse name: Not on file  . Number of children: Not on file  . Years of education: Not on file  . Highest education level: Not on file   Occupational History  . Not on file  Tobacco Use  . Smoking status: Former Research scientist (life sciences)  . Smokeless tobacco: Never Used  Substance and Sexual Activity  . Alcohol use: No  . Drug use: No  . Sexual activity: Not on file  Other Topics Concern  . Not on file  Social History Narrative  . Not on file   Social Determinants of Health   Financial Resource Strain:   . Difficulty of Paying Living Expenses: Not on file  Food Insecurity:   . Worried About Charity fundraiser in the Last Year: Not on file  . Ran Out of Food in the Last Year: Not on file  Transportation Needs:   . Lack of Transportation (Medical): Not on file  . Lack of Transportation (Non-Medical): Not on file  Physical Activity:   . Days of Exercise per Week: Not on file  . Minutes of Exercise per Session: Not on file  Stress:   . Feeling of Stress : Not on file  Social Connections:   . Frequency of Communication with Friends and Family: Not on file  . Frequency of Social Gatherings with Friends and  Family: Not on file  . Attends Religious Services: Not on file  . Active Member of Clubs or Organizations: Not on file  . Attends Archivist Meetings: Not on file  . Marital Status: Not on file  Intimate Partner Violence:   . Fear of Current or Ex-Partner: Not on file  . Emotionally Abused: Not on file  . Physically Abused: Not on file  . Sexually Abused: Not on file    Past Surgical History:  Procedure Laterality Date  . CATARACT EXTRACTION Right   . TONSILLECTOMY    . TOOTH EXTRACTION      Family History  Problem Relation Age of Onset  . Cancer Mother   . Stroke Father   . Cancer Sister     No Known Allergies  No flowsheet data found.    CMP  No results found for: NA, K, CL, CO2, GLUCOSE, BUN, CREATININE, CALCIUM, PROT, ALBUMIN, AST, ALT, ALKPHOS, BILITOT, GFRNONAA, GFRAA   No results found.     Assessment & Plan:   1. Venous ulcer ([place]) No surgery or intervention at this point in time.     I have had a long discussion with the patient regarding venous insufficiency and why it  causes symptoms, specifically venous ulceration . I have discussed with the patient the chronic skin changes that accompany venous insufficiency and the long term sequela such as infection and recurring  ulceration.  Patient will be placed in [employer] which will be changed weekly drainage permitting.  In addition, behavioral modification including several periods of elevation of the lower extremities during the day will be continued. Achieving a position with the ankles at heart level was stressed to the patient  The patient is instructed to begin routine exercise, especially walking on a daily basis  Patient should undergo duplex ultrasound of the venous system to ensure that DVT or reflux is not present.  Following the review of the ultrasound the patient will follow up in four weeks to reassess the degree of swelling and the control that Unna therapy is offering.   The patient can be assessed for graduated compression stockings or wraps as well as a Lymph Pump once the ulcers are healed.   2. Lymphedema No surgery or intervention at this point in time.    I have reviewed my discussion with the patient regarding venous insufficiency and secondary lymph edema and why it  causes symptoms. I have discussed with the patient the chronic skin changes that accompany these problems and the long term sequela such as ulceration and infection.  Patient will continue wearing graduated compression stockings class 1 (20-30 mmHg) on a daily basis a prescription was given to the patient to keep this updated. The patient will  put the stockings on first thing in the morning and removing them in the evening. The patient is instructed specifically not to sleep in the stockings.  In addition, behavioral modification including elevation during the day will be continued.  Diet and salt restriction was also discussed.    3.  Essential hypertension Continue antihypertensive medications as already ordered, these medications have been reviewed and there are no changes at this time.    Current Outpatient Medications on File Prior to Visit  Medication Sig Dispense Refill  . bisoprolol-hydrochlorothiazide (ZIAC) 10-6.25 MG tablet Take 1 tablet by mouth daily.    . furosemide (LASIX) 20 MG tablet Take 20 mg by mouth.    Marland Kitchen glimepiride (AMARYL) 4 MG tablet     .  hydrALAZINE (APRESOLINE) 10 MG tablet Take 10 mg by mouth 3 (three) times daily.    Marland Kitchen lisinopril-hydrochlorothiazide (PRINZIDE,ZESTORETIC) 20-12.5 MG tablet Take 1 tablet by mouth daily.    . metFORMIN (GLUCOPHAGE) 1000 MG tablet Take 1,000 mg by mouth 2 (two) times daily with a meal.    . pioglitazone (ACTOS) 15 MG tablet Take 15 mg by mouth daily.    . pravastatin (PRAVACHOL) 80 MG tablet Take 40 mg by mouth daily. daily    . sertraline (ZOLOFT) 100 MG tablet Take 100 mg by mouth daily.      No current facility-administered medications on file prior to visit.    There are no Patient Instructions on file for this visit. No follow-ups on file.   Kris Hartmann, NP

## 2020-04-15 DIAGNOSIS — B351 Tinea unguium: Secondary | ICD-10-CM | POA: Diagnosis not present

## 2020-04-15 DIAGNOSIS — Z7984 Long term (current) use of oral hypoglycemic drugs: Secondary | ICD-10-CM | POA: Diagnosis not present

## 2020-04-15 DIAGNOSIS — I890 Lymphedema, not elsewhere classified: Secondary | ICD-10-CM | POA: Diagnosis not present

## 2020-04-15 DIAGNOSIS — I10 Essential (primary) hypertension: Secondary | ICD-10-CM | POA: Diagnosis not present

## 2020-04-15 DIAGNOSIS — C9590 Leukemia, unspecified not having achieved remission: Secondary | ICD-10-CM | POA: Diagnosis not present

## 2020-04-15 DIAGNOSIS — I872 Venous insufficiency (chronic) (peripheral): Secondary | ICD-10-CM | POA: Diagnosis not present

## 2020-04-15 DIAGNOSIS — E1151 Type 2 diabetes mellitus with diabetic peripheral angiopathy without gangrene: Secondary | ICD-10-CM | POA: Diagnosis not present

## 2020-04-15 DIAGNOSIS — Z87891 Personal history of nicotine dependence: Secondary | ICD-10-CM | POA: Diagnosis not present

## 2020-04-15 DIAGNOSIS — E782 Mixed hyperlipidemia: Secondary | ICD-10-CM | POA: Diagnosis not present

## 2020-04-15 DIAGNOSIS — Z9181 History of falling: Secondary | ICD-10-CM | POA: Diagnosis not present

## 2020-04-16 ENCOUNTER — Other Ambulatory Visit: Payer: Self-pay

## 2020-04-16 ENCOUNTER — Ambulatory Visit (INDEPENDENT_AMBULATORY_CARE_PROVIDER_SITE_OTHER): Payer: PPO | Admitting: Nurse Practitioner

## 2020-04-16 VITALS — BP 125/70 | HR 64 | Ht 73.0 in | Wt 343.0 lb

## 2020-04-16 DIAGNOSIS — I890 Lymphedema, not elsewhere classified: Secondary | ICD-10-CM

## 2020-04-16 DIAGNOSIS — I83009 Varicose veins of unspecified lower extremity with ulcer of unspecified site: Secondary | ICD-10-CM

## 2020-04-16 DIAGNOSIS — I10 Essential (primary) hypertension: Secondary | ICD-10-CM

## 2020-04-16 DIAGNOSIS — L97909 Non-pressure chronic ulcer of unspecified part of unspecified lower leg with unspecified severity: Secondary | ICD-10-CM

## 2020-04-16 MED ORDER — CEPHALEXIN 500 MG PO CAPS: 500.0000 mg | ORAL_CAPSULE | Freq: Three times a day (TID) | ORAL | 0 refills | Status: DC

## 2020-04-20 ENCOUNTER — Encounter (INDEPENDENT_AMBULATORY_CARE_PROVIDER_SITE_OTHER): Payer: Self-pay | Admitting: Nurse Practitioner

## 2020-04-20 NOTE — Progress Notes (Signed)
Subjective:    Patient ID: [name], male    DOB: [birth year], 69 y.o.   MRN: [id number] Chief Complaint  Patient presents with  . Follow-up    4 week unna boot check    The patient presents today for Unna wrap check.  The patient continues to have excessive pedal edema and although is about the same compared to his last visit, and his left foot is very red and angry.  The patient does not have any blisters however he continues to have a shallow ulceration of the left medial ankle and the left upper shin.  The right  lower extremity is improved and doesn't have any ulcers.  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  The patient is able to utilize his lymphedema pump two time a day but doesn't feel it is much help.  He is adherent with his Unna wraps.  The patient is somewhat active.  He denies any fever, chills or syncope.  The patient's left lower extremity is somewhat concerning for cellulitis as he has had some increased redness and discomfort.   Review of Systems  Cardiovascular: Positive for leg swelling.  Skin: Positive for color change and wound.  All other systems reviewed and are negative.      Objective:   Physical Exam Vitals reviewed.  HENT:     Head: Normocephalic.  Cardiovascular:     Rate and Rhythm: Normal rate.     Pulses: Normal pulses.  Pulmonary:     Effort: Pulmonary effort is normal.  Musculoskeletal:     Right lower leg: Edema present.     Left lower leg: Edema present.  Skin:    General: Skin is warm.     Findings: Erythema present.  Neurological:     Mental Status: He is alert and oriented to person, place, and time.  Psychiatric:        Mood and Affect: Mood normal.        Behavior: Behavior normal.        Thought Content: Thought content normal.        Judgment: Judgment normal.     BP 125/70   Pulse 64   Ht 6\' 1"  (1.854 m)   Wt (!) 343 lb (155.6 kg)    BMI 45.25 kg/m   Past Medical History:  Diagnosis Date  . Diabetes mellitus without complication ([place])   . Hyperlipidemia   . Hypertension   . Leukemia [facility])     Social History   Socioeconomic History  . Marital status: Divorced    Spouse name: Not on file  . Number of children: Not on file  . Years of education: Not on file  . Highest education level: Not on file  Occupational History  . Not on file  Tobacco Use  . Smoking status: Former Research scientist (life sciences)  . Smokeless tobacco: Never Used  Substance and Sexual Activity  . Alcohol use: No  . Drug use: No  . Sexual activity: Not on file  Other Topics Concern  . Not on file  Social History Narrative  . Not on file   Social Determinants of Health   Financial Resource Strain: Not on file  Food Insecurity: Not on file  Transportation Needs: Not on file  Physical Activity: Not on file  Stress: Not on file  Social Connections: Not on file  Intimate Partner Violence: Not on file  Past Surgical History:  Procedure Laterality Date  . CATARACT EXTRACTION Right   . TONSILLECTOMY    . TOOTH EXTRACTION      Family History  Problem Relation Age of Onset  . Cancer Mother   . Stroke Father   . Cancer Sister     No Known Allergies  No flowsheet data found.    CMP  No results found for: NA, K, CL, CO2, GLUCOSE, BUN, CREATININE, CALCIUM, PROT, ALBUMIN, AST, ALT, ALKPHOS, BILITOT, GFRNONAA, GFRAA   No results found.     Assessment & Plan:   1. Lymphedema  No surgery or intervention at this point in time.    I have reviewed my discussion with the patient regarding lymphedema and why it  causes symptoms.  Patient will continue wearing graduated compression stockings class 1 (20-30 mmHg) on a daily basis a prescription was given. The patient is reminded to put the stockings on first thing in the morning and removing them in the evening. The patient is instructed specifically not to sleep in the stockings.   In addition,  behavioral modification throughout the day will be continued.  This will include frequent elevation (such as in a recliner), use of over the counter pain medications as needed and exercise such as walking.  I have reviewed systemic causes for chronic edema such as liver, kidney and cardiac etiologies and there does not appear to be any significant changes in these organ systems over the past year.  The patient is under the impression that these organ systems are all stable and unchanged.    The patient will continue aggressive use of the  lymph pump.  This will continue to improve the edema control and prevent sequela such as ulcers and infections.      2. Venous ulcer ([place]) We will continue to have the patient in bilateral Unna wraps.  He still has some areas that are not quite considered ulcerations but are weepy and concerning for possible ulcerative areas.  There was also the concern that he may have some cellulitis beginning in his left lower extremity and so we have also sent in antibiotics as well.  3. Essential hypertension Continue antihypertensive medications as already ordered, these medications have been reviewed and there are no changes at this time.    Current Outpatient Medications on File Prior to Visit  Medication Sig Dispense Refill  . bisoprolol-hydrochlorothiazide (ZIAC) 10-6.25 MG tablet Take 1 tablet by mouth daily.    . furosemide (LASIX) 20 MG tablet Take 20 mg by mouth.    . hydrALAZINE (APRESOLINE) 10 MG tablet Take 10 mg by mouth 3 (three) times daily.    Marland Kitchen lisinopril-hydrochlorothiazide (PRINZIDE,ZESTORETIC) 20-12.5 MG tablet Take 1 tablet by mouth daily.    . metFORMIN (GLUCOPHAGE) 1000 MG tablet Take 1,000 mg by mouth 2 (two) times daily with a meal.    . pioglitazone (ACTOS) 15 MG tablet Take 15 mg by mouth daily.    . pravastatin (PRAVACHOL) 80 MG tablet Take 40 mg by mouth daily. daily    . sertraline (ZOLOFT) 100 MG tablet Take 100 mg by mouth daily.     Marland Kitchen  glimepiride (AMARYL) 4 MG tablet  (Patient not taking: Reported on 04/16/2020)     No current facility-administered medications on file prior to visit.    There are no Patient Instructions on file for this visit. No follow-ups on file.   Kris Hartmann, NP

## 2020-04-23 ENCOUNTER — Ambulatory Visit: Payer: PPO | Admitting: Podiatry

## 2020-04-23 ENCOUNTER — Other Ambulatory Visit: Payer: Self-pay

## 2020-04-23 ENCOUNTER — Encounter: Payer: Self-pay | Admitting: Podiatry

## 2020-04-23 DIAGNOSIS — I872 Venous insufficiency (chronic) (peripheral): Secondary | ICD-10-CM | POA: Diagnosis not present

## 2020-04-23 DIAGNOSIS — M79675 Pain in left toe(s): Secondary | ICD-10-CM

## 2020-04-23 DIAGNOSIS — B351 Tinea unguium: Secondary | ICD-10-CM | POA: Diagnosis not present

## 2020-04-23 DIAGNOSIS — E11628 Type 2 diabetes mellitus with other skin complications: Secondary | ICD-10-CM | POA: Diagnosis not present

## 2020-04-23 DIAGNOSIS — M79674 Pain in right toe(s): Secondary | ICD-10-CM | POA: Diagnosis not present

## 2020-04-23 NOTE — Progress Notes (Signed)
This patient returns to my office for at risk foot care.  This patient requires this care by a professional since this patient will be at risk due to having diabetes and venous insufficiency.  This patient is unable to cut nails himself since the patient cannot reach his nails.These nails are painful walking and wearing shoes.  Patient is wearing unna boots  B/L. This patient presents for at risk foot care today.  General Appearance  Alert, conversant and in no acute stress.  Vascular  Deferred due to unna boots.  Neurologic  Deferred due to unna boots.  Nails Thick disfigured discolored nails with subungual debris  from hallux to fifth toes bilaterally. No evidence of bacterial infection or drainage bilaterally.  Orthopedic  No limitations of motion  feet .  No crepitus or effusions noted.  No bony pathology or digital deformities noted.  Skin  normotropic skin with no porokeratosis noted bilaterally.  No signs of infections or ulcers noted.     Onychomycosis  Pain in right toes  Pain in left toes  Consent was obtained for treatment procedures.   Mechanical debridement of nails 1-5  bilaterally performed with a nail nipper.  Filed with dremel without incident.    Return office visit    3 months                 Told patient to return for periodic foot care and evaluation due to potential at risk complications.   Gardiner [name] DPM

## 2020-05-07 ENCOUNTER — Ambulatory Visit (INDEPENDENT_AMBULATORY_CARE_PROVIDER_SITE_OTHER): Payer: PPO | Admitting: Nurse Practitioner

## 2020-05-07 ENCOUNTER — Other Ambulatory Visit: Payer: Self-pay

## 2020-05-07 ENCOUNTER — Encounter (INDEPENDENT_AMBULATORY_CARE_PROVIDER_SITE_OTHER): Payer: Self-pay | Admitting: Nurse Practitioner

## 2020-05-07 VITALS — BP 112/72 | HR 79 | Ht 73.0 in | Wt 349.0 lb

## 2020-05-07 DIAGNOSIS — B351 Tinea unguium: Secondary | ICD-10-CM | POA: Diagnosis not present

## 2020-05-07 DIAGNOSIS — L97909 Non-pressure chronic ulcer of unspecified part of unspecified lower leg with unspecified severity: Secondary | ICD-10-CM

## 2020-05-07 DIAGNOSIS — E782 Mixed hyperlipidemia: Secondary | ICD-10-CM | POA: Diagnosis not present

## 2020-05-07 DIAGNOSIS — C9590 Leukemia, unspecified not having achieved remission: Secondary | ICD-10-CM | POA: Diagnosis not present

## 2020-05-07 DIAGNOSIS — I83009 Varicose veins of unspecified lower extremity with ulcer of unspecified site: Secondary | ICD-10-CM

## 2020-05-07 DIAGNOSIS — E1151 Type 2 diabetes mellitus with diabetic peripheral angiopathy without gangrene: Secondary | ICD-10-CM | POA: Diagnosis not present

## 2020-05-07 DIAGNOSIS — Z9181 History of falling: Secondary | ICD-10-CM | POA: Diagnosis not present

## 2020-05-07 DIAGNOSIS — Z7982 Long term (current) use of aspirin: Secondary | ICD-10-CM | POA: Diagnosis not present

## 2020-05-07 DIAGNOSIS — Z7984 Long term (current) use of oral hypoglycemic drugs: Secondary | ICD-10-CM | POA: Diagnosis not present

## 2020-05-07 DIAGNOSIS — I872 Venous insufficiency (chronic) (peripheral): Secondary | ICD-10-CM | POA: Diagnosis not present

## 2020-05-07 DIAGNOSIS — I10 Essential (primary) hypertension: Secondary | ICD-10-CM | POA: Diagnosis not present

## 2020-05-07 DIAGNOSIS — Z87891 Personal history of nicotine dependence: Secondary | ICD-10-CM | POA: Diagnosis not present

## 2020-05-07 DIAGNOSIS — I890 Lymphedema, not elsewhere classified: Secondary | ICD-10-CM | POA: Diagnosis not present

## 2020-05-07 NOTE — Progress Notes (Signed)
History of Present Illness  There is no documented history at this time  Assessments & Plan   There are no diagnoses linked to this encounter.    Additional instructions  Subjective:  Patient presents with venous ulcer of the Bilateral lower extremity.    Procedure:  3 layer unna wrap was placed Bilateral lower extremity.   Plan:   Follow up in one week.  

## 2020-05-12 DIAGNOSIS — I890 Lymphedema, not elsewhere classified: Secondary | ICD-10-CM | POA: Diagnosis not present

## 2020-05-12 DIAGNOSIS — Z7982 Long term (current) use of aspirin: Secondary | ICD-10-CM | POA: Diagnosis not present

## 2020-05-12 DIAGNOSIS — B351 Tinea unguium: Secondary | ICD-10-CM | POA: Diagnosis not present

## 2020-05-12 DIAGNOSIS — E782 Mixed hyperlipidemia: Secondary | ICD-10-CM | POA: Diagnosis not present

## 2020-05-12 DIAGNOSIS — Z7984 Long term (current) use of oral hypoglycemic drugs: Secondary | ICD-10-CM | POA: Diagnosis not present

## 2020-05-12 DIAGNOSIS — I10 Essential (primary) hypertension: Secondary | ICD-10-CM | POA: Diagnosis not present

## 2020-05-12 DIAGNOSIS — Z9181 History of falling: Secondary | ICD-10-CM | POA: Diagnosis not present

## 2020-05-12 DIAGNOSIS — C9590 Leukemia, unspecified not having achieved remission: Secondary | ICD-10-CM | POA: Diagnosis not present

## 2020-05-12 DIAGNOSIS — I872 Venous insufficiency (chronic) (peripheral): Secondary | ICD-10-CM | POA: Diagnosis not present

## 2020-05-12 DIAGNOSIS — Z87891 Personal history of nicotine dependence: Secondary | ICD-10-CM | POA: Diagnosis not present

## 2020-05-12 DIAGNOSIS — E1151 Type 2 diabetes mellitus with diabetic peripheral angiopathy without gangrene: Secondary | ICD-10-CM | POA: Diagnosis not present

## 2020-05-14 ENCOUNTER — Ambulatory Visit (INDEPENDENT_AMBULATORY_CARE_PROVIDER_SITE_OTHER): Payer: PPO | Admitting: Nurse Practitioner

## 2020-05-14 ENCOUNTER — Other Ambulatory Visit: Payer: Self-pay

## 2020-05-14 VITALS — BP 120/70 | HR 58 | Ht 73.0 in | Wt 344.0 lb

## 2020-05-14 DIAGNOSIS — L97909 Non-pressure chronic ulcer of unspecified part of unspecified lower leg with unspecified severity: Secondary | ICD-10-CM | POA: Diagnosis not present

## 2020-05-14 DIAGNOSIS — I83009 Varicose veins of unspecified lower extremity with ulcer of unspecified site: Secondary | ICD-10-CM | POA: Diagnosis not present

## 2020-05-14 NOTE — Progress Notes (Signed)
History of Present Illness  There is no documented history at this time  Assessments & Plan   There are no diagnoses linked to this encounter.    Additional instructions  Subjective:  Patient presents with venous ulcer of the Bilateral lower extremity.    Procedure:  3 layer unna wrap was placed Bilateral lower extremity.   Plan:   Follow up in one week.  

## 2020-05-18 ENCOUNTER — Encounter (INDEPENDENT_AMBULATORY_CARE_PROVIDER_SITE_OTHER): Payer: Self-pay | Admitting: Nurse Practitioner

## 2020-05-19 DIAGNOSIS — E1151 Type 2 diabetes mellitus with diabetic peripheral angiopathy without gangrene: Secondary | ICD-10-CM | POA: Diagnosis not present

## 2020-05-19 DIAGNOSIS — I890 Lymphedema, not elsewhere classified: Secondary | ICD-10-CM | POA: Diagnosis not present

## 2020-05-19 DIAGNOSIS — E782 Mixed hyperlipidemia: Secondary | ICD-10-CM | POA: Diagnosis not present

## 2020-05-19 DIAGNOSIS — B351 Tinea unguium: Secondary | ICD-10-CM | POA: Diagnosis not present

## 2020-05-19 DIAGNOSIS — I872 Venous insufficiency (chronic) (peripheral): Secondary | ICD-10-CM | POA: Diagnosis not present

## 2020-05-19 DIAGNOSIS — Z9181 History of falling: Secondary | ICD-10-CM | POA: Diagnosis not present

## 2020-05-19 DIAGNOSIS — I10 Essential (primary) hypertension: Secondary | ICD-10-CM | POA: Diagnosis not present

## 2020-05-19 DIAGNOSIS — Z7982 Long term (current) use of aspirin: Secondary | ICD-10-CM | POA: Diagnosis not present

## 2020-05-19 DIAGNOSIS — Z87891 Personal history of nicotine dependence: Secondary | ICD-10-CM | POA: Diagnosis not present

## 2020-05-19 DIAGNOSIS — Z7984 Long term (current) use of oral hypoglycemic drugs: Secondary | ICD-10-CM | POA: Diagnosis not present

## 2020-05-19 DIAGNOSIS — C9590 Leukemia, unspecified not having achieved remission: Secondary | ICD-10-CM | POA: Diagnosis not present

## 2020-06-09 DIAGNOSIS — E1151 Type 2 diabetes mellitus with diabetic peripheral angiopathy without gangrene: Secondary | ICD-10-CM | POA: Diagnosis not present

## 2020-06-09 DIAGNOSIS — E782 Mixed hyperlipidemia: Secondary | ICD-10-CM | POA: Diagnosis not present

## 2020-06-09 DIAGNOSIS — Z9181 History of falling: Secondary | ICD-10-CM | POA: Diagnosis not present

## 2020-06-09 DIAGNOSIS — Z7984 Long term (current) use of oral hypoglycemic drugs: Secondary | ICD-10-CM | POA: Diagnosis not present

## 2020-06-09 DIAGNOSIS — Z7982 Long term (current) use of aspirin: Secondary | ICD-10-CM | POA: Diagnosis not present

## 2020-06-09 DIAGNOSIS — Z87891 Personal history of nicotine dependence: Secondary | ICD-10-CM | POA: Diagnosis not present

## 2020-06-09 DIAGNOSIS — C9590 Leukemia, unspecified not having achieved remission: Secondary | ICD-10-CM | POA: Diagnosis not present

## 2020-06-09 DIAGNOSIS — B351 Tinea unguium: Secondary | ICD-10-CM | POA: Diagnosis not present

## 2020-06-09 DIAGNOSIS — I872 Venous insufficiency (chronic) (peripheral): Secondary | ICD-10-CM | POA: Diagnosis not present

## 2020-06-09 DIAGNOSIS — I890 Lymphedema, not elsewhere classified: Secondary | ICD-10-CM | POA: Diagnosis not present

## 2020-06-09 DIAGNOSIS — I10 Essential (primary) hypertension: Secondary | ICD-10-CM | POA: Diagnosis not present

## 2020-06-11 ENCOUNTER — Other Ambulatory Visit: Payer: Self-pay

## 2020-06-11 ENCOUNTER — Ambulatory Visit (INDEPENDENT_AMBULATORY_CARE_PROVIDER_SITE_OTHER): Payer: PPO | Admitting: Nurse Practitioner

## 2020-06-11 ENCOUNTER — Encounter (INDEPENDENT_AMBULATORY_CARE_PROVIDER_SITE_OTHER): Payer: Self-pay | Admitting: Nurse Practitioner

## 2020-06-11 VITALS — BP 120/66 | HR 60 | Resp 16 | Wt 345.0 lb

## 2020-06-11 DIAGNOSIS — E782 Mixed hyperlipidemia: Secondary | ICD-10-CM

## 2020-06-11 DIAGNOSIS — I890 Lymphedema, not elsewhere classified: Secondary | ICD-10-CM | POA: Diagnosis not present

## 2020-06-11 DIAGNOSIS — L97909 Non-pressure chronic ulcer of unspecified part of unspecified lower leg with unspecified severity: Secondary | ICD-10-CM | POA: Diagnosis not present

## 2020-06-11 DIAGNOSIS — I83009 Varicose veins of unspecified lower extremity with ulcer of unspecified site: Secondary | ICD-10-CM

## 2020-06-11 DIAGNOSIS — I10 Essential (primary) hypertension: Secondary | ICD-10-CM | POA: Diagnosis not present

## 2020-06-11 DIAGNOSIS — Z7689 Persons encountering health services in other specified circumstances: Secondary | ICD-10-CM | POA: Diagnosis not present

## 2020-06-11 MED ORDER — LEVOFLOXACIN 750 MG PO TABS: 750.0000 mg | ORAL_TABLET | Freq: Every day | ORAL | 0 refills | Status: AC

## 2020-06-15 ENCOUNTER — Encounter (INDEPENDENT_AMBULATORY_CARE_PROVIDER_SITE_OTHER): Payer: Self-pay | Admitting: Nurse Practitioner

## 2020-06-15 LAB — AEROBIC CULTURE

## 2020-06-15 NOTE — Progress Notes (Signed)
Subjective:    Patient ID: [name], male    DOB: Oct 30, 1950, 70 y.o.   MRN: [id number] Chief Complaint  Patient presents with  . Follow-up    4 week unna boot check    The patient presents today for evaluation of lower extremity edema as well as ulceration on his left medial ankle area.  The patient continues to have severe pedal edema which is likely caused by not elevating his lower extremities due to difficulties.  The patient unfortunately is not the most compliant.  He has a lymphedema pump but does not utilize it.  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  Currently the patient does have some mild weeping present with pitting edema.  He denies any chest pain or shortness of breath, however he also admits he is not very active.   Review of Systems  Cardiovascular: Positive for leg swelling.  Skin: Positive for wound.  All other systems reviewed and are negative.      Objective:   Physical Exam Vitals reviewed.  HENT:     Head: Normocephalic.  Cardiovascular:     Rate and Rhythm: Normal rate.  Pulmonary:     Effort: Pulmonary effort is normal.  Musculoskeletal:     Right lower leg: 2+ Pitting Edema present.     Left lower leg: 2+ Pitting Edema present.  Neurological:     Mental Status: He is alert and oriented to person, place, and time.  Psychiatric:        Mood and Affect: Mood normal.        Behavior: Behavior normal.        Thought Content: Thought content normal.        Judgment: Judgment normal.     BP 120/66 (BP Location: Right Arm)   Pulse 60   Resp 16   Wt (!) 345 lb (156.5 kg)   BMI 45.52 kg/m   Past Medical History:  Diagnosis Date  . Diabetes mellitus without complication ([place])   . Hyperlipidemia   . Hypertension   . Leukemia [facility])     Social History   Socioeconomic History  . Marital status: Divorced    Spouse name: Not on file  . Number of children: Not on  file  . Years of education: Not on file  . Highest education level: Not on file  Occupational History  . Not on file  Tobacco Use  . Smoking status: Former Research scientist (life sciences)  . Smokeless tobacco: Never Used  Substance and Sexual Activity  . Alcohol use: No  . Drug use: No  . Sexual activity: Not on file  Other Topics Concern  . Not on file  Social History Narrative  . Not on file   Social Determinants of Health   Financial Resource Strain: Not on file  Food Insecurity: Not on file  Transportation Needs: Not on file  Physical Activity: Not on file  Stress: Not on file  Social Connections: Not on file  Intimate Partner Violence: Not on file    Past Surgical History:  Procedure Laterality Date  . CATARACT EXTRACTION Right   . TONSILLECTOMY    . TOOTH EXTRACTION      Family History  Problem Relation Age of Onset  . Cancer Mother   . Stroke Father   . Cancer Sister     No Known Allergies  No flowsheet data found.    CMP  No results  found for: NA, K, CL, CO2, GLUCOSE, BUN, CREATININE, CALCIUM, PROT, ALBUMIN, AST, ALT, ALKPHOS, BILITOT, GFRNONAA, GFRAA   No results found.     Assessment & Plan:   1. Venous ulcer ([place]) No surgery or intervention at this point in time.    I have had a long discussion with the patient regarding venous insufficiency and why it  causes symptoms, specifically venous ulceration . I have discussed with the patient the chronic skin changes that accompany venous insufficiency and the long term sequela such as infection and recurring  ulceration.  Patient will be placed in [employer] which will be changed weekly drainage permitting.  In addition, behavioral modification including several periods of elevation of the lower extremities during the day will be continued. Achieving a position with the ankles at heart level was stressed to the patient  The patient continues to have home health changes wraps.  Based on the consistency of the drainage we  cultured the wound however we will treat for antibiotics and change antibiotic pending culture and sensitivity, if necessary  2. Essential hypertension Continue antihypertensive medications as already ordered, these medications have been reviewed and there are no changes at this time.   3. Lymphedema I had a long discussion with the patient in regards to his lymphedema.  The patient was pitting up to his knee and I highly suspect there is a cardiac involvement given his weeping as well.  The patient previously had a cardiac referral which he turned down.  Discussed the possibility of placing another cardiac referral however the patient has refused at this time.  The patient states "he does not want to know if he has heart problems or not".  I strongly advised patient to at least go through with evaluation however he continues to refuse.  We will continue with conservative measures .  4. Mixed hyperlipidemia Continue statin as ordered and reviewed, no changes at this time    Current Outpatient Medications on File Prior to Visit  Medication Sig Dispense Refill  . bisoprolol-hydrochlorothiazide (ZIAC) 10-6.25 MG tablet Take 1 tablet by mouth daily.    . Cholecalciferol 25 MCG (1000 UT) tablet Take 2 tablets by mouth daily.    . furosemide (LASIX) 20 MG tablet Take 20 mg by mouth.    . hydrALAZINE (APRESOLINE) 10 MG tablet Take 10 mg by mouth 3 (three) times daily.    Marland Kitchen lisinopril (ZESTRIL) 40 MG tablet TAKE ONE TABLET BY MOUTH EVERY DAY FOR BLOOD PRESSURE **REPLACES LISINOPRIL/HCTZ    . metFORMIN (GLUCOPHAGE) 1000 MG tablet Take 1,000 mg by mouth 2 (two) times daily with a meal.    . pioglitazone (ACTOS) 15 MG tablet Take 15 mg by mouth daily.    . pravastatin (PRAVACHOL) 80 MG tablet Take 40 mg by mouth daily. daily    . sertraline (ZOLOFT) 100 MG tablet Take 100 mg by mouth daily.     . cephALEXin (KEFLEX) 500 MG capsule Take 1 capsule (500 mg total) by mouth 3 (three) times daily. (Patient  not taking: Reported on 06/11/2020) 21 capsule 0  . lisinopril-hydrochlorothiazide (PRINZIDE,ZESTORETIC) 20-12.5 MG tablet Take 1 tablet by mouth daily. (Patient not taking: Reported on 06/11/2020)     No current facility-administered medications on file prior to visit.    There are no Patient Instructions on file for this visit. No follow-ups on file.   Kris Hartmann, NP

## 2020-06-18 ENCOUNTER — Encounter (INDEPENDENT_AMBULATORY_CARE_PROVIDER_SITE_OTHER): Payer: PPO

## 2020-06-25 ENCOUNTER — Encounter (INDEPENDENT_AMBULATORY_CARE_PROVIDER_SITE_OTHER): Payer: PPO

## 2020-06-30 DIAGNOSIS — Z7984 Long term (current) use of oral hypoglycemic drugs: Secondary | ICD-10-CM | POA: Diagnosis not present

## 2020-06-30 DIAGNOSIS — I890 Lymphedema, not elsewhere classified: Secondary | ICD-10-CM | POA: Diagnosis not present

## 2020-06-30 DIAGNOSIS — Z9181 History of falling: Secondary | ICD-10-CM | POA: Diagnosis not present

## 2020-06-30 DIAGNOSIS — Z7982 Long term (current) use of aspirin: Secondary | ICD-10-CM | POA: Diagnosis not present

## 2020-06-30 DIAGNOSIS — I10 Essential (primary) hypertension: Secondary | ICD-10-CM | POA: Diagnosis not present

## 2020-06-30 DIAGNOSIS — E782 Mixed hyperlipidemia: Secondary | ICD-10-CM | POA: Diagnosis not present

## 2020-06-30 DIAGNOSIS — C9590 Leukemia, unspecified not having achieved remission: Secondary | ICD-10-CM | POA: Diagnosis not present

## 2020-06-30 DIAGNOSIS — Z87891 Personal history of nicotine dependence: Secondary | ICD-10-CM | POA: Diagnosis not present

## 2020-06-30 DIAGNOSIS — I872 Venous insufficiency (chronic) (peripheral): Secondary | ICD-10-CM | POA: Diagnosis not present

## 2020-06-30 DIAGNOSIS — B351 Tinea unguium: Secondary | ICD-10-CM | POA: Diagnosis not present

## 2020-06-30 DIAGNOSIS — E1151 Type 2 diabetes mellitus with diabetic peripheral angiopathy without gangrene: Secondary | ICD-10-CM | POA: Diagnosis not present

## 2020-07-02 ENCOUNTER — Ambulatory Visit (INDEPENDENT_AMBULATORY_CARE_PROVIDER_SITE_OTHER): Payer: PPO | Admitting: Nurse Practitioner

## 2020-07-02 ENCOUNTER — Encounter (INDEPENDENT_AMBULATORY_CARE_PROVIDER_SITE_OTHER): Payer: Self-pay | Admitting: Nurse Practitioner

## 2020-07-02 ENCOUNTER — Other Ambulatory Visit: Payer: Self-pay

## 2020-07-02 VITALS — BP 118/69 | HR 76 | Ht 73.0 in | Wt 343.0 lb

## 2020-07-02 DIAGNOSIS — E782 Mixed hyperlipidemia: Secondary | ICD-10-CM

## 2020-07-02 DIAGNOSIS — F528 Other sexual dysfunction not due to a substance or known physiological condition: Secondary | ICD-10-CM | POA: Insufficient documentation

## 2020-07-02 DIAGNOSIS — Z9189 Other specified personal risk factors, not elsewhere classified: Secondary | ICD-10-CM | POA: Insufficient documentation

## 2020-07-02 DIAGNOSIS — I890 Lymphedema, not elsewhere classified: Secondary | ICD-10-CM | POA: Diagnosis not present

## 2020-07-02 DIAGNOSIS — Z0271 Encounter for disability determination: Secondary | ICD-10-CM | POA: Insufficient documentation

## 2020-07-02 DIAGNOSIS — I10 Essential (primary) hypertension: Secondary | ICD-10-CM

## 2020-07-02 DIAGNOSIS — F17200 Nicotine dependence, unspecified, uncomplicated: Secondary | ICD-10-CM | POA: Insufficient documentation

## 2020-07-02 DIAGNOSIS — H269 Unspecified cataract: Secondary | ICD-10-CM | POA: Insufficient documentation

## 2020-07-02 DIAGNOSIS — R609 Edema, unspecified: Secondary | ICD-10-CM | POA: Insufficient documentation

## 2020-07-02 DIAGNOSIS — I119 Hypertensive heart disease without heart failure: Secondary | ICD-10-CM | POA: Insufficient documentation

## 2020-07-02 DIAGNOSIS — K429 Umbilical hernia without obstruction or gangrene: Secondary | ICD-10-CM | POA: Insufficient documentation

## 2020-07-02 DIAGNOSIS — I739 Peripheral vascular disease, unspecified: Secondary | ICD-10-CM | POA: Insufficient documentation

## 2020-07-02 MED ORDER — LEVOFLOXACIN 750 MG PO TABS: 750.0000 mg | ORAL_TABLET | Freq: Every day | ORAL | 0 refills | Status: AC

## 2020-07-05 ENCOUNTER — Encounter (INDEPENDENT_AMBULATORY_CARE_PROVIDER_SITE_OTHER): Payer: Self-pay | Admitting: Nurse Practitioner

## 2020-07-05 NOTE — Progress Notes (Signed)
Subjective:    Patient ID: [name], male    DOB: Aug 17, 1950, 70 y.o.   MRN: [id number] Chief Complaint  Patient presents with  . Follow-up    4 wk unna boot check    The patient returns today for evaluation of bilateral lower extremity edema.  The patient's pedal edema is by far the worst.  The patient notes that he continues to have drainage from his left lower extremity.  The area of drainage is coming from his medial ankle area which is not quite have an ulcer but the skin is damp and somewhat macerated.  The patient does note that the drainage has a foul-smelling odor.  The patient continues to deny frequent usage of lymphedema pump.  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  He denies having any fever or chills.   Review of Systems  Cardiovascular: Positive for leg swelling.  All other systems reviewed and are negative.      Objective:   Physical Exam Vitals reviewed.  HENT:     Head: Normocephalic.  Cardiovascular:     Rate and Rhythm: Normal rate.     Comments: Unable to palpate pedal pulses due to edema Pulmonary:     Effort: Pulmonary effort is normal.  Musculoskeletal:     Right lower leg: 4+ Edema present.     Left lower leg: 4+ Edema present.  Skin:    Findings: Erythema present.  Neurological:     Mental Status: He is alert and oriented to person, place, and time.  Psychiatric:        Mood and Affect: Mood normal.        Behavior: Behavior normal.        Thought Content: Thought content normal.        Judgment: Judgment normal.     BP 118/69   Pulse 76   Ht 6\' 1"  (1.854 m)   Wt (!) 343 lb (155.6 kg)   BMI 45.25 kg/m   Past Medical History:  Diagnosis Date  . Diabetes mellitus without complication ([place])   . Hyperlipidemia   . Hypertension   . Leukemia [facility])     Social History   Socioeconomic History  . Marital status: Divorced    Spouse name: Not on file  . Number of  children: Not on file  . Years of education: Not on file  . Highest education level: Not on file  Occupational History  . Not on file  Tobacco Use  . Smoking status: Former Research scientist (life sciences)  . Smokeless tobacco: Never Used  Substance and Sexual Activity  . Alcohol use: No  . Drug use: No  . Sexual activity: Not on file  Other Topics Concern  . Not on file  Social History Narrative  . Not on file   Social Determinants of Health   Financial Resource Strain: Not on file  Food Insecurity: Not on file  Transportation Needs: Not on file  Physical Activity: Not on file  Stress: Not on file  Social Connections: Not on file  Intimate Partner Violence: Not on file    Past Surgical History:  Procedure Laterality Date  . CATARACT EXTRACTION Right   . TONSILLECTOMY    . TOOTH EXTRACTION      Family History  Problem Relation Age of Onset  . Cancer Mother   . Stroke Father   . Cancer Sister     No  Known Allergies  No flowsheet data found.    CMP  No results found for: NA, K, CL, CO2, GLUCOSE, BUN, CREATININE, CALCIUM, PROT, ALBUMIN, AST, ALT, ALKPHOS, BILITOT, GFRNONAA, GFRAA   No results found.     Assessment & Plan:   1. Lymphedema The patient will continue to be in wraps, changed by home health on a twice weekly basis.  We also prescribed another round of Levaquin for the patient as he continues to have signs and symptoms of infection.  Patient was also encouraged once again to visit cardiology however he is not ready at this time.  We will have patient return to the office in 4 weeks  2. Essential hypertension Continue antihypertensive medications as already ordered, these medications have been reviewed and there are no changes at this time.   3. Mixed hyperlipidemia Continue statin as ordered and reviewed, no changes at this time    Current Outpatient Medications on File Prior to Visit  Medication Sig Dispense Refill  . bisoprolol-hydrochlorothiazide (ZIAC) 10-6.25  MG tablet Take 1 tablet by mouth daily.    . Cholecalciferol 25 MCG (1000 UT) tablet Take 2 tablets by mouth daily.    . furosemide (LASIX) 20 MG tablet Take 20 mg by mouth.    . hydrALAZINE (APRESOLINE) 10 MG tablet Take 10 mg by mouth 3 (three) times daily.    Marland Kitchen lisinopril (ZESTRIL) 40 MG tablet TAKE ONE TABLET BY MOUTH EVERY DAY FOR BLOOD PRESSURE **REPLACES LISINOPRIL/HCTZ    . lisinopril-hydrochlorothiazide (PRINZIDE,ZESTORETIC) 20-12.5 MG tablet Take 1 tablet by mouth daily.    . metFORMIN (GLUCOPHAGE) 1000 MG tablet Take 1,000 mg by mouth 2 (two) times daily with a meal.    . pioglitazone (ACTOS) 15 MG tablet Take 15 mg by mouth daily.    . pravastatin (PRAVACHOL) 80 MG tablet Take 40 mg by mouth daily. daily    . sertraline (ZOLOFT) 100 MG tablet Take 100 mg by mouth daily.      No current facility-administered medications on file prior to visit.    There are no Patient Instructions on file for this visit. No follow-ups on file.   Kris Hartmann, NP

## 2020-07-07 DIAGNOSIS — C9590 Leukemia, unspecified not having achieved remission: Secondary | ICD-10-CM | POA: Diagnosis not present

## 2020-07-07 DIAGNOSIS — B351 Tinea unguium: Secondary | ICD-10-CM | POA: Diagnosis not present

## 2020-07-07 DIAGNOSIS — Z87891 Personal history of nicotine dependence: Secondary | ICD-10-CM | POA: Diagnosis not present

## 2020-07-07 DIAGNOSIS — I10 Essential (primary) hypertension: Secondary | ICD-10-CM | POA: Diagnosis not present

## 2020-07-07 DIAGNOSIS — Z7984 Long term (current) use of oral hypoglycemic drugs: Secondary | ICD-10-CM | POA: Diagnosis not present

## 2020-07-07 DIAGNOSIS — I872 Venous insufficiency (chronic) (peripheral): Secondary | ICD-10-CM | POA: Diagnosis not present

## 2020-07-07 DIAGNOSIS — I890 Lymphedema, not elsewhere classified: Secondary | ICD-10-CM | POA: Diagnosis not present

## 2020-07-07 DIAGNOSIS — Z9181 History of falling: Secondary | ICD-10-CM | POA: Diagnosis not present

## 2020-07-07 DIAGNOSIS — E1151 Type 2 diabetes mellitus with diabetic peripheral angiopathy without gangrene: Secondary | ICD-10-CM | POA: Diagnosis not present

## 2020-07-07 DIAGNOSIS — Z7982 Long term (current) use of aspirin: Secondary | ICD-10-CM | POA: Diagnosis not present

## 2020-07-07 DIAGNOSIS — E782 Mixed hyperlipidemia: Secondary | ICD-10-CM | POA: Diagnosis not present

## 2020-07-13 ENCOUNTER — Telehealth (INDEPENDENT_AMBULATORY_CARE_PROVIDER_SITE_OTHER): Payer: Self-pay

## 2020-07-13 NOTE — Telephone Encounter (Signed)
I called and spoke to the nurse Jamas Lav from [facility]  about the pt she said they have  only 8 visits total for March and April total  so the pt will have to go down to 1 visit per week do to insurance refusing to pay due to the pt not having any improvements and they have been seeing him since August of last 2021. The nurse Jamas Lav from [facility] recommends that they change his wraps once a week and [facility]  change his wraps once a week until August 25,2022 until which his insurance will no longer pay for there services and he will need to come to [facility] to be wrapped twice a week. The pt is currently soaking through 2 layer wraps so she recommends 4 layer wraps due to the pt's  bad drainage. The pt also frequents bacterial infection in his legs per the nurse. Per the nurse the pt will need to be seen here  in our off this week on Thursday of Friday she is going out tomorrow to change is wraps.

## 2020-07-13 NOTE — Telephone Encounter (Signed)
The nurse returned my call about the pt having his wraps done 2 times a weeks and when and why did he need to start coming in to be wrapped. Nurse Jamas Lav from [facility] called back left a  VM saying that the pt does get home care 2 times a week to do his wraps but they are having insurance issues with the patient. I called the nurse back and am waiting for a call back as to when he needs to start coming in.

## 2020-07-13 NOTE — Telephone Encounter (Signed)
Please see below.

## 2020-07-17 ENCOUNTER — Encounter (INDEPENDENT_AMBULATORY_CARE_PROVIDER_SITE_OTHER): Payer: Self-pay

## 2020-07-17 ENCOUNTER — Other Ambulatory Visit: Payer: Self-pay

## 2020-07-17 ENCOUNTER — Ambulatory Visit (INDEPENDENT_AMBULATORY_CARE_PROVIDER_SITE_OTHER): Payer: PPO | Admitting: Nurse Practitioner

## 2020-07-17 VITALS — BP 105/53 | HR 59 | Resp 16 | Wt 338.0 lb

## 2020-07-17 DIAGNOSIS — L97909 Non-pressure chronic ulcer of unspecified part of unspecified lower leg with unspecified severity: Secondary | ICD-10-CM

## 2020-07-17 DIAGNOSIS — I83009 Varicose veins of unspecified lower extremity with ulcer of unspecified site: Secondary | ICD-10-CM

## 2020-07-17 NOTE — Progress Notes (Signed)
History of Present Illness  There is no documented history at this time  Assessments & Plan   There are no diagnoses linked to this encounter.    Additional instructions  Subjective:  Patient presents with venous ulcer of the Bilateral lower extremity.    Procedure:  3 layer unna wrap was placed Bilateral lower extremity.   Plan:   Follow up in one week.  

## 2020-07-23 ENCOUNTER — Encounter (INDEPENDENT_AMBULATORY_CARE_PROVIDER_SITE_OTHER): Payer: Self-pay

## 2020-07-23 ENCOUNTER — Other Ambulatory Visit: Payer: Self-pay

## 2020-07-23 ENCOUNTER — Ambulatory Visit (INDEPENDENT_AMBULATORY_CARE_PROVIDER_SITE_OTHER): Payer: PPO | Admitting: Nurse Practitioner

## 2020-07-23 VITALS — BP 106/62 | HR 61 | Resp 16 | Wt 332.8 lb

## 2020-07-23 DIAGNOSIS — L97909 Non-pressure chronic ulcer of unspecified part of unspecified lower leg with unspecified severity: Secondary | ICD-10-CM

## 2020-07-23 DIAGNOSIS — I83009 Varicose veins of unspecified lower extremity with ulcer of unspecified site: Secondary | ICD-10-CM | POA: Diagnosis not present

## 2020-07-23 NOTE — Progress Notes (Signed)
History of Present Illness  There is no documented history at this time  Assessments & Plan   There are no diagnoses linked to this encounter.    Additional instructions  Subjective:  Patient presents with venous ulcer of the Bilateral lower extremity.    Procedure:  3 layer unna wrap was placed Bilateral lower extremity.   Plan:   Follow up in one week.  

## 2020-07-24 ENCOUNTER — Encounter (INDEPENDENT_AMBULATORY_CARE_PROVIDER_SITE_OTHER): Payer: PPO

## 2020-07-27 ENCOUNTER — Ambulatory Visit: Payer: PPO | Admitting: Podiatry

## 2020-07-27 ENCOUNTER — Other Ambulatory Visit: Payer: Self-pay

## 2020-07-27 ENCOUNTER — Encounter: Payer: Self-pay | Admitting: Podiatry

## 2020-07-27 DIAGNOSIS — I872 Venous insufficiency (chronic) (peripheral): Secondary | ICD-10-CM | POA: Diagnosis not present

## 2020-07-27 DIAGNOSIS — E11628 Type 2 diabetes mellitus with other skin complications: Secondary | ICD-10-CM | POA: Diagnosis not present

## 2020-07-27 DIAGNOSIS — M79674 Pain in right toe(s): Secondary | ICD-10-CM | POA: Diagnosis not present

## 2020-07-27 DIAGNOSIS — B351 Tinea unguium: Secondary | ICD-10-CM | POA: Diagnosis not present

## 2020-07-27 DIAGNOSIS — M79675 Pain in left toe(s): Secondary | ICD-10-CM | POA: Diagnosis not present

## 2020-07-27 NOTE — Progress Notes (Signed)
This patient returns to my office for at risk foot care.  This patient requires this care by a professional since this patient will be at risk due to having diabetes and venous insufficiency.  This patient is unable to cut nails himself since the patient cannot reach his nails.These nails are painful walking and wearing shoes.  Patient is wearing unna boots  B/L. This patient presents for at risk foot care today.  General Appearance  Alert, conversant and in no acute stress.  Vascular  Deferred due to unna boots.  Neurologic  Deferred due to unna boots.  Nails Thick disfigured discolored nails with subungual debris  from hallux to fifth toes bilaterally. No evidence of bacterial infection or drainage bilaterally.  Orthopedic  No limitations of motion  feet .  No crepitus or effusions noted.  No bony pathology or digital deformities noted.  Skin  normotropic skin with no porokeratosis noted bilaterally.  No signs of infections or ulcers noted.     Onychomycosis  Pain in right toes  Pain in left toes  Consent was obtained for treatment procedures.   Mechanical debridement of nails 1-5  bilaterally performed with a nail nipper.  Filed with dremel without incident.  Patient was told to watch the the base of his big toe left foot since there is masceration noted.   Return office visit    3 months                 Told patient to return for periodic foot care and evaluation due to potential at risk complications.   Gardiner [name] DPM

## 2020-07-28 ENCOUNTER — Encounter (INDEPENDENT_AMBULATORY_CARE_PROVIDER_SITE_OTHER): Payer: Self-pay | Admitting: Nurse Practitioner

## 2020-07-30 ENCOUNTER — Ambulatory Visit (INDEPENDENT_AMBULATORY_CARE_PROVIDER_SITE_OTHER): Payer: PPO | Admitting: Nurse Practitioner

## 2020-07-31 ENCOUNTER — Ambulatory Visit (INDEPENDENT_AMBULATORY_CARE_PROVIDER_SITE_OTHER): Payer: PPO | Admitting: Nurse Practitioner

## 2020-07-31 ENCOUNTER — Encounter (INDEPENDENT_AMBULATORY_CARE_PROVIDER_SITE_OTHER): Payer: Self-pay | Admitting: Nurse Practitioner

## 2020-07-31 ENCOUNTER — Other Ambulatory Visit: Payer: Self-pay

## 2020-07-31 VITALS — BP 135/75 | HR 69 | Ht 73.0 in | Wt 330.0 lb

## 2020-07-31 DIAGNOSIS — I890 Lymphedema, not elsewhere classified: Secondary | ICD-10-CM | POA: Diagnosis not present

## 2020-07-31 DIAGNOSIS — I83009 Varicose veins of unspecified lower extremity with ulcer of unspecified site: Secondary | ICD-10-CM

## 2020-07-31 DIAGNOSIS — I10 Essential (primary) hypertension: Secondary | ICD-10-CM | POA: Diagnosis not present

## 2020-07-31 DIAGNOSIS — E782 Mixed hyperlipidemia: Secondary | ICD-10-CM | POA: Diagnosis not present

## 2020-07-31 DIAGNOSIS — B372 Candidiasis of skin and nail: Secondary | ICD-10-CM | POA: Diagnosis not present

## 2020-07-31 DIAGNOSIS — L97909 Non-pressure chronic ulcer of unspecified part of unspecified lower leg with unspecified severity: Secondary | ICD-10-CM | POA: Diagnosis not present

## 2020-07-31 MED ORDER — SILVER SULFADIAZINE 1 % EX CREA: TOPICAL_CREAM | CUTANEOUS | 0 refills | Status: DC

## 2020-07-31 MED ORDER — CIPROFLOXACIN HCL 500 MG PO TABS: 500.0000 mg | ORAL_TABLET | Freq: Two times a day (BID) | ORAL | 0 refills | Status: DC

## 2020-07-31 MED ORDER — NYSTATIN 100000 UNIT/GM EX POWD: CUTANEOUS | 0 refills | Status: AC

## 2020-07-31 NOTE — Progress Notes (Signed)
Subjective:    Patient ID: [name], male    DOB: 10/13/1950, 70 y.o.   MRN: [id number] Chief Complaint  Patient presents with  . Follow-up    4 week unna boot check     The patient returns today for evaluation of bilateral lower extremity edema.  The patient's pedal edema is by far the worst.  The patient notes that he continues to have drainage from his left lower extremity.  The area of drainage is coming from his medial ankle area which is not quite have an ulcer but the skin is damp and somewhat macerated.  The patient does note that the drainage has a foul-smelling odor.  The patient continues to deny frequent usage of lymphedema pump.  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  He denies having any fever or chills.   Review of Systems  Cardiovascular: Positive for leg swelling.  Skin: Positive for wound.  All other systems reviewed and are negative.      Objective:   Physical Exam Vitals reviewed.  HENT:     Head: Normocephalic.  Cardiovascular:     Rate and Rhythm: Normal rate.  Pulmonary:     Effort: Pulmonary effort is normal.  Musculoskeletal:     Right lower leg: 2+ Pitting Edema present.     Left lower leg: 3+ Pitting Edema present.  Skin:    General: Skin is warm and moist.  Neurological:     Mental Status: He is alert and oriented to person, place, and time.  Psychiatric:        Mood and Affect: Mood normal.        Behavior: Behavior normal.        Thought Content: Thought content normal.        Judgment: Judgment normal.     BP 135/75   Pulse 69   Ht 6\' 1"  (1.854 m)   Wt (!) 330 lb (149.7 kg)   BMI 43.54 kg/m   Past Medical History:  Diagnosis Date  . Diabetes mellitus without complication ([place])   . Hyperlipidemia   . Hypertension   . Leukemia [facility])     Social History   Socioeconomic History  . Marital status: Divorced    Spouse name: Not on file  . Number of  children: Not on file  . Years of education: Not on file  . Highest education level: Not on file  Occupational History  . Not on file  Tobacco Use  . Smoking status: Former Research scientist (life sciences)  . Smokeless tobacco: Never Used  Substance and Sexual Activity  . Alcohol use: No  . Drug use: No  . Sexual activity: Not on file  Other Topics Concern  . Not on file  Social History Narrative  . Not on file   Social Determinants of Health   Financial Resource Strain: Not on file  Food Insecurity: Not on file  Transportation Needs: Not on file  Physical Activity: Not on file  Stress: Not on file  Social Connections: Not on file  Intimate Partner Violence: Not on file    Past Surgical History:  Procedure Laterality Date  . CATARACT EXTRACTION Right   . TONSILLECTOMY    . TOOTH EXTRACTION      Family History  Problem Relation Age of Onset  . Cancer Mother   . Stroke Father   . Cancer Sister     No  Known Allergies  No flowsheet data found.    CMP  No results found for: NA, K, CL, CO2, GLUCOSE, BUN, CREATININE, CALCIUM, PROT, ALBUMIN, AST, ALT, ALKPHOS, BILITOT, GFRNONAA, GFRAA   No results found.     Assessment & Plan:   1. Lymphedema The patient will remain in wraps to be changed on a weekly basis by home health.  We will attempt to see if the patient can have his wraps changed twice weekly instead to try to help with the drainage control.  We will also place him for Silvadene on the wound underneath his wraps in addition to changing his antibiotics to see if that will yield better results.  The patient is urged to try to continue to elevate his lower extremities.  I also suspect that the patient's swelling may be somewhat multifactorial in cause however the patient continues to refuse cardiology referral.  2. Venous ulcer ([place]) The patient has an area that has been resistant to antibiotics despite wound cultures and ensuring that the wound is susceptible to antibiotics being  given.  This is likely due to multiple factors.  While the patient is having his MRIs done regularly he is weeping copiously and I suspect that the area sits saturated for extended periods of time.  Unfortunately the patient also is not very adherent with conservative therapy such as elevating his legs or using his lymphedema pump either.  If at the patient's next wrap visit there is not some improvement in his wound we may discuss referral to infectious disease for possible IV antibiotics. - ciprofloxacin (CIPRO) 500 MG tablet; Take 1 tablet (500 mg total) by mouth 2 (two) times daily.  Dispense: 28 tablet; Refill: 0 - silver sulfADIAZINE (SILVADENE) 1 % cream; Apply to left ankle with unna wrap changes  Dispense: 400 g; Refill: 0  3. Essential hypertension Continue antihypertensive medications as already ordered, these medications have been reviewed and there are no changes at this time.   4. Mixed hyperlipidemia Continue statin as ordered and reviewed, no changes at this time   5. Yeast infection of the skin The patient has severe swelling in his left lower extremity.  In between the crevices of his toes there is moisture and concern for yeast.  Patient is advised to place between toes with his wrap changes on her cleaning occurs. - nystatin (MYCOSTATIN/NYSTOP) powder; Apply to toe crevices with unna wrap changes  Dispense: 30 g; Refill: 0   Current Outpatient Medications on File Prior to Visit  Medication Sig Dispense Refill  . bisoprolol (ZEBETA) 5 MG tablet Take 1 tablet by mouth daily.    . bisoprolol-hydrochlorothiazide (ZIAC) 10-6.25 MG tablet Take 1 tablet by mouth daily.    . Cholecalciferol 25 MCG (1000 UT) tablet Take 2 tablets by mouth daily.    . furosemide (LASIX) 20 MG tablet Take 20 mg by mouth.    . hydrALAZINE (APRESOLINE) 10 MG tablet Take 10 mg by mouth 3 (three) times daily.    Marland Kitchen lisinopril (ZESTRIL) 40 MG tablet TAKE ONE TABLET BY MOUTH EVERY DAY FOR BLOOD PRESSURE  **REPLACES LISINOPRIL/HCTZ    . lisinopril-hydrochlorothiazide (PRINZIDE,ZESTORETIC) 20-12.5 MG tablet Take 1 tablet by mouth daily.    . metFORMIN (GLUCOPHAGE) 1000 MG tablet Take 1,000 mg by mouth 2 (two) times daily with a meal.    . pioglitazone (ACTOS) 15 MG tablet Take 15 mg by mouth daily.    . pravastatin (PRAVACHOL) 80 MG tablet Take 40 mg by mouth daily. daily    .  sertraline (ZOLOFT) 100 MG tablet Take 100 mg by mouth daily.      No current facility-administered medications on file prior to visit.    There are no Patient Instructions on file for this visit. No follow-ups on file.   Kris Hartmann, NP

## 2020-08-01 ENCOUNTER — Encounter (INDEPENDENT_AMBULATORY_CARE_PROVIDER_SITE_OTHER): Payer: Self-pay | Admitting: Nurse Practitioner

## 2020-08-06 ENCOUNTER — Ambulatory Visit (INDEPENDENT_AMBULATORY_CARE_PROVIDER_SITE_OTHER): Payer: PPO | Admitting: Nurse Practitioner

## 2020-08-06 ENCOUNTER — Other Ambulatory Visit: Payer: Self-pay

## 2020-08-06 VITALS — BP 104/62 | HR 73 | Resp 17 | Ht 73.0 in | Wt 333.0 lb

## 2020-08-06 DIAGNOSIS — L97909 Non-pressure chronic ulcer of unspecified part of unspecified lower leg with unspecified severity: Secondary | ICD-10-CM | POA: Diagnosis not present

## 2020-08-06 DIAGNOSIS — I83009 Varicose veins of unspecified lower extremity with ulcer of unspecified site: Secondary | ICD-10-CM | POA: Diagnosis not present

## 2020-08-06 NOTE — Progress Notes (Signed)
History of Present Illness  There is no documented history at this time  Assessments & Plan   There are no diagnoses linked to this encounter.    Additional instructions  Subjective:  Patient presents with venous ulcer of the Bilateral lower extremity.    Procedure:  3 layer unna wrap was placed Bilateral lower extremity.   Plan:   Follow up in one week.  

## 2020-08-07 ENCOUNTER — Encounter (INDEPENDENT_AMBULATORY_CARE_PROVIDER_SITE_OTHER): Payer: PPO

## 2020-08-08 ENCOUNTER — Encounter (INDEPENDENT_AMBULATORY_CARE_PROVIDER_SITE_OTHER): Payer: Self-pay | Admitting: Nurse Practitioner

## 2020-08-10 DIAGNOSIS — C9590 Leukemia, unspecified not having achieved remission: Secondary | ICD-10-CM | POA: Diagnosis not present

## 2020-08-10 DIAGNOSIS — I872 Venous insufficiency (chronic) (peripheral): Secondary | ICD-10-CM | POA: Diagnosis not present

## 2020-08-10 DIAGNOSIS — Z9181 History of falling: Secondary | ICD-10-CM | POA: Diagnosis not present

## 2020-08-10 DIAGNOSIS — I890 Lymphedema, not elsewhere classified: Secondary | ICD-10-CM | POA: Diagnosis not present

## 2020-08-10 DIAGNOSIS — Z7984 Long term (current) use of oral hypoglycemic drugs: Secondary | ICD-10-CM | POA: Diagnosis not present

## 2020-08-10 DIAGNOSIS — Z7982 Long term (current) use of aspirin: Secondary | ICD-10-CM | POA: Diagnosis not present

## 2020-08-10 DIAGNOSIS — E1151 Type 2 diabetes mellitus with diabetic peripheral angiopathy without gangrene: Secondary | ICD-10-CM | POA: Diagnosis not present

## 2020-08-10 DIAGNOSIS — I10 Essential (primary) hypertension: Secondary | ICD-10-CM | POA: Diagnosis not present

## 2020-08-10 DIAGNOSIS — B351 Tinea unguium: Secondary | ICD-10-CM | POA: Diagnosis not present

## 2020-08-10 DIAGNOSIS — Z87891 Personal history of nicotine dependence: Secondary | ICD-10-CM | POA: Diagnosis not present

## 2020-08-10 DIAGNOSIS — E782 Mixed hyperlipidemia: Secondary | ICD-10-CM | POA: Diagnosis not present

## 2020-08-13 ENCOUNTER — Encounter (INDEPENDENT_AMBULATORY_CARE_PROVIDER_SITE_OTHER): Payer: Self-pay

## 2020-08-13 ENCOUNTER — Other Ambulatory Visit: Payer: Self-pay

## 2020-08-13 ENCOUNTER — Ambulatory Visit (INDEPENDENT_AMBULATORY_CARE_PROVIDER_SITE_OTHER): Payer: PPO | Admitting: Nurse Practitioner

## 2020-08-13 VITALS — BP 114/68 | HR 83 | Resp 16 | Wt 330.6 lb

## 2020-08-13 DIAGNOSIS — L97909 Non-pressure chronic ulcer of unspecified part of unspecified lower leg with unspecified severity: Secondary | ICD-10-CM

## 2020-08-13 DIAGNOSIS — I83009 Varicose veins of unspecified lower extremity with ulcer of unspecified site: Secondary | ICD-10-CM | POA: Diagnosis not present

## 2020-08-13 NOTE — Progress Notes (Signed)
History of Present Illness  There is no documented history at this time  Assessments & Plan   There are no diagnoses linked to this encounter.    Additional instructions  Subjective:  Patient presents with venous ulcer of the Bilateral lower extremity.    Procedure:  3 layer unna wrap was placed Bilateral lower extremity.   Plan:   Follow up in one week.  

## 2020-08-20 ENCOUNTER — Ambulatory Visit (INDEPENDENT_AMBULATORY_CARE_PROVIDER_SITE_OTHER): Payer: PPO | Admitting: Nurse Practitioner

## 2020-08-20 ENCOUNTER — Other Ambulatory Visit: Payer: Self-pay

## 2020-08-20 ENCOUNTER — Ambulatory Visit (INDEPENDENT_AMBULATORY_CARE_PROVIDER_SITE_OTHER): Payer: PPO | Admitting: Vascular Surgery

## 2020-08-20 VITALS — BP 106/64 | HR 73 | Ht 73.0 in | Wt 334.0 lb

## 2020-08-20 DIAGNOSIS — L97909 Non-pressure chronic ulcer of unspecified part of unspecified lower leg with unspecified severity: Secondary | ICD-10-CM | POA: Diagnosis not present

## 2020-08-20 DIAGNOSIS — I83009 Varicose veins of unspecified lower extremity with ulcer of unspecified site: Secondary | ICD-10-CM

## 2020-08-20 NOTE — Progress Notes (Signed)
History of Present Illness  There is no documented history at this time  Assessments & Plan   There are no diagnoses linked to this encounter.    Additional instructions  Subjective:  Patient presents with venous ulcer of the Bilateral lower extremity.    Procedure:  3 layer unna wrap was placed Bilateral lower extremity.   Plan:   Follow up in one week. Added Aquacel to the raw places on the pt's LLE  The pt was already receiving this treatment with home care nurses at home.

## 2020-08-25 ENCOUNTER — Encounter (INDEPENDENT_AMBULATORY_CARE_PROVIDER_SITE_OTHER): Payer: Self-pay | Admitting: Nurse Practitioner

## 2020-08-26 DIAGNOSIS — C9590 Leukemia, unspecified not having achieved remission: Secondary | ICD-10-CM | POA: Diagnosis not present

## 2020-08-26 DIAGNOSIS — Z7982 Long term (current) use of aspirin: Secondary | ICD-10-CM | POA: Diagnosis not present

## 2020-08-26 DIAGNOSIS — I890 Lymphedema, not elsewhere classified: Secondary | ICD-10-CM | POA: Diagnosis not present

## 2020-08-26 DIAGNOSIS — Z7984 Long term (current) use of oral hypoglycemic drugs: Secondary | ICD-10-CM | POA: Diagnosis not present

## 2020-08-26 DIAGNOSIS — E782 Mixed hyperlipidemia: Secondary | ICD-10-CM | POA: Diagnosis not present

## 2020-08-26 DIAGNOSIS — E1151 Type 2 diabetes mellitus with diabetic peripheral angiopathy without gangrene: Secondary | ICD-10-CM | POA: Diagnosis not present

## 2020-08-26 DIAGNOSIS — I872 Venous insufficiency (chronic) (peripheral): Secondary | ICD-10-CM | POA: Diagnosis not present

## 2020-08-26 DIAGNOSIS — B351 Tinea unguium: Secondary | ICD-10-CM | POA: Diagnosis not present

## 2020-08-26 DIAGNOSIS — Z87891 Personal history of nicotine dependence: Secondary | ICD-10-CM | POA: Diagnosis not present

## 2020-08-26 DIAGNOSIS — I10 Essential (primary) hypertension: Secondary | ICD-10-CM | POA: Diagnosis not present

## 2020-08-26 DIAGNOSIS — Z9181 History of falling: Secondary | ICD-10-CM | POA: Diagnosis not present

## 2020-08-27 ENCOUNTER — Ambulatory Visit (INDEPENDENT_AMBULATORY_CARE_PROVIDER_SITE_OTHER): Payer: PPO | Admitting: Vascular Surgery

## 2020-09-03 ENCOUNTER — Ambulatory Visit (INDEPENDENT_AMBULATORY_CARE_PROVIDER_SITE_OTHER): Payer: PPO | Admitting: Nurse Practitioner

## 2020-09-03 ENCOUNTER — Other Ambulatory Visit: Payer: Self-pay

## 2020-09-03 ENCOUNTER — Other Ambulatory Visit (INDEPENDENT_AMBULATORY_CARE_PROVIDER_SITE_OTHER): Payer: Self-pay | Admitting: Nurse Practitioner

## 2020-09-03 ENCOUNTER — Encounter (INDEPENDENT_AMBULATORY_CARE_PROVIDER_SITE_OTHER): Payer: Self-pay | Admitting: Nurse Practitioner

## 2020-09-03 VITALS — BP 106/61 | HR 64 | Ht 73.0 in | Wt 336.0 lb

## 2020-09-03 DIAGNOSIS — I10 Essential (primary) hypertension: Secondary | ICD-10-CM

## 2020-09-03 DIAGNOSIS — E11620 Type 2 diabetes mellitus with diabetic dermatitis: Secondary | ICD-10-CM | POA: Diagnosis not present

## 2020-09-03 DIAGNOSIS — E782 Mixed hyperlipidemia: Secondary | ICD-10-CM | POA: Diagnosis not present

## 2020-09-03 DIAGNOSIS — I83009 Varicose veins of unspecified lower extremity with ulcer of unspecified site: Secondary | ICD-10-CM

## 2020-09-03 DIAGNOSIS — L97909 Non-pressure chronic ulcer of unspecified part of unspecified lower leg with unspecified severity: Secondary | ICD-10-CM | POA: Diagnosis not present

## 2020-09-03 DIAGNOSIS — Z7689 Persons encountering health services in other specified circumstances: Secondary | ICD-10-CM | POA: Diagnosis not present

## 2020-09-03 NOTE — Progress Notes (Signed)
Subjective:    Patient ID: [name], male    DOB: Nov 06, 1950, 70 y.o.   MRN: [id number] Chief Complaint  Patient presents with  . Follow-up    4 week unna boot check    The patient presents today for follow-up evaluation of lymphedema and bilateral Unna wraps.  The patient continues to have a copiously weeping area from his left medial ankle.  We have tried multiple rounds of antibiotics to no avail.  The patient notes that this wound has a foul-smelling odor with weeping.  However it does not appear to be red or inflamed or cellulitic.  The right lower extremity has small ulcerative areas.  The patient is worse edema is in his feet.  The patient continues to refuse evaluation by cardiology.  The patient notes his continued difficulty with elevation and use of lymphedema pump.  They denies any fever or chills.   Review of Systems  Cardiovascular: Positive for leg swelling.  Skin: Positive for wound.  All other systems reviewed and are negative.      Objective:   Physical Exam Vitals reviewed.  HENT:     Head: Normocephalic.  Cardiovascular:     Rate and Rhythm: Normal rate.  Pulmonary:     Effort: Pulmonary effort is normal.  Musculoskeletal:     Right lower leg: 4+ Edema present.     Left lower leg: 4+ Edema present.  Neurological:     Mental Status: He is alert and oriented to person, place, and time.  Psychiatric:        Mood and Affect: Mood normal.        Behavior: Behavior normal.        Thought Content: Thought content normal.        Judgment: Judgment normal.     BP 106/61   Pulse 64   Ht 6\' 1"  (1.854 m)   Wt (!) 336 lb (152.4 kg)   BMI 44.33 kg/m   Past Medical History:  Diagnosis Date  . Diabetes mellitus without complication ([place])   . Hyperlipidemia   . Hypertension   . Leukemia [facility])     Social History   Socioeconomic History  . Marital status: Divorced    Spouse name: Not on file  . Number of children: Not on file  . Years of education:  Not on file  . Highest education level: Not on file  Occupational History  . Not on file  Tobacco Use  . Smoking status: Former Research scientist (life sciences)  . Smokeless tobacco: Never Used  Substance and Sexual Activity  . Alcohol use: No  . Drug use: No  . Sexual activity: Not on file  Other Topics Concern  . Not on file  Social History Narrative  . Not on file   Social Determinants of Health   Financial Resource Strain: Not on file  Food Insecurity: Not on file  Transportation Needs: Not on file  Physical Activity: Not on file  Stress: Not on file  Social Connections: Not on file  Intimate Partner Violence: Not on file    Past Surgical History:  Procedure Laterality Date  . CATARACT EXTRACTION Right   . TONSILLECTOMY    . TOOTH EXTRACTION      Family History  Problem Relation Age of Onset  . Cancer Mother   . Stroke Father   . Cancer Sister     No Known Allergies  No flowsheet data found.    CMP  No results found for: NA,  K, CL, CO2, GLUCOSE, BUN, CREATININE, CALCIUM, PROT, ALBUMIN, AST, ALT, ALKPHOS, BILITOT, GFRNONAA, GFRAA   No results found.     Assessment & Plan:   1. Venous ulcer ([place]) The patient continues to have copious weeping from his left medial ankle area.  The wound has a foul-smelling odor.  It was recently cultured in February but we will plan to culture again in anticipation for possible IV antibiotics as we have tried multiple rounds of Levaquin as well as ciprofloxacin but had no improvement in this area.  Suspect that the patient will need IV antibiotics and wound volume is an ulcerating area.  The patient is also advised that conservative therapy will be helpful with helping the wound heal however the patient typically does have a hard time complying with the simplest conservative therapy tactics and just elevating.  However this was stressed once again.  We will continue to have the patient's wraps changed on a weekly basis and have him follow-up in 4  weeks.  Pending the result of the wound culture we will plan on possible IV antibiotics.  2. Essential hypertension Continue antihypertensive medications as already ordered, these medications have been reviewed and there are no changes at this time.   3. Mixed hyperlipidemia Continue statin as ordered and reviewed, no changes at this time   4. Type 2 diabetes mellitus with diabetic dermatitis, without long-term current use of insulin (HCC) Continue hypoglycemic medications as already ordered, these medications have been reviewed and there are no changes at this time.  Hgb A1C to be monitored as already arranged by primary service    Current Outpatient Medications on File Prior to Visit  Medication Sig Dispense Refill  . bisoprolol (ZEBETA) 5 MG tablet Take 1 tablet by mouth daily.    . bisoprolol-hydrochlorothiazide (ZIAC) 10-6.25 MG tablet Take 1 tablet by mouth daily.    . Cholecalciferol 25 MCG (1000 UT) tablet Take 2 tablets by mouth daily.    . ciprofloxacin (CIPRO) 500 MG tablet Take 1 tablet (500 mg total) by mouth 2 (two) times daily. 28 tablet 0  . furosemide (LASIX) 20 MG tablet Take 20 mg by mouth.    . hydrALAZINE (APRESOLINE) 10 MG tablet Take 10 mg by mouth 3 (three) times daily.    Marland Kitchen lisinopril (ZESTRIL) 40 MG tablet TAKE ONE TABLET BY MOUTH EVERY DAY FOR BLOOD PRESSURE **REPLACES LISINOPRIL/HCTZ    . lisinopril-hydrochlorothiazide (PRINZIDE,ZESTORETIC) 20-12.5 MG tablet Take 1 tablet by mouth daily.    . metFORMIN (GLUCOPHAGE) 1000 MG tablet Take 1,000 mg by mouth 2 (two) times daily with a meal.    . nystatin (MYCOSTATIN/NYSTOP) powder Apply to toe crevices with unna wrap changes 30 g 0  . pioglitazone (ACTOS) 15 MG tablet Take 15 mg by mouth daily.    . pravastatin (PRAVACHOL) 80 MG tablet Take 40 mg by mouth daily. daily    . sertraline (ZOLOFT) 100 MG tablet Take 100 mg by mouth daily.     . silver sulfADIAZINE (SILVADENE) 1 % cream Apply to left ankle with unna  wrap changes 400 g 0   No current facility-administered medications on file prior to visit.    There are no Patient Instructions on file for this visit. No follow-ups on file.   Kris Hartmann, NP

## 2020-09-07 LAB — AEROBIC CULTURE

## 2020-09-10 ENCOUNTER — Ambulatory Visit (INDEPENDENT_AMBULATORY_CARE_PROVIDER_SITE_OTHER): Payer: PPO | Admitting: Nurse Practitioner

## 2020-09-10 ENCOUNTER — Other Ambulatory Visit: Payer: Self-pay

## 2020-09-10 ENCOUNTER — Encounter (INDEPENDENT_AMBULATORY_CARE_PROVIDER_SITE_OTHER): Payer: Self-pay

## 2020-09-10 VITALS — BP 100/66 | HR 70 | Resp 16 | Wt 333.0 lb

## 2020-09-10 DIAGNOSIS — I83009 Varicose veins of unspecified lower extremity with ulcer of unspecified site: Secondary | ICD-10-CM

## 2020-09-10 DIAGNOSIS — L97909 Non-pressure chronic ulcer of unspecified part of unspecified lower leg with unspecified severity: Secondary | ICD-10-CM | POA: Diagnosis not present

## 2020-09-10 NOTE — Progress Notes (Signed)
History of Present Illness  There is no documented history at this time  Assessments & Plan   There are no diagnoses linked to this encounter.    Additional instructions  Subjective:  Patient presents with venous ulcer of the Bilateral lower extremity.    Procedure:  3 layer unna wrap was placed Bilateral lower extremity.   Plan:   Follow up in one week.  

## 2020-09-13 ENCOUNTER — Encounter (INDEPENDENT_AMBULATORY_CARE_PROVIDER_SITE_OTHER): Payer: Self-pay | Admitting: Nurse Practitioner

## 2020-09-17 ENCOUNTER — Ambulatory Visit (INDEPENDENT_AMBULATORY_CARE_PROVIDER_SITE_OTHER): Payer: PPO | Admitting: Nurse Practitioner

## 2020-09-17 ENCOUNTER — Other Ambulatory Visit: Payer: Self-pay

## 2020-09-17 VITALS — BP 99/58 | HR 90 | Ht 73.0 in | Wt 330.0 lb

## 2020-09-17 DIAGNOSIS — I83009 Varicose veins of unspecified lower extremity with ulcer of unspecified site: Secondary | ICD-10-CM | POA: Diagnosis not present

## 2020-09-17 DIAGNOSIS — L97909 Non-pressure chronic ulcer of unspecified part of unspecified lower leg with unspecified severity: Secondary | ICD-10-CM

## 2020-09-17 NOTE — Progress Notes (Signed)
History of Present Illness  There is no documented history at this time  Assessments & Plan   There are no diagnoses linked to this encounter.    Additional instructions  Subjective:  Patient presents with venous ulcer of the Bilateral lower extremity.    Procedure:  3 layer unna wrap was placed Bilateral lower extremity.   Plan:   Follow up in one week.  

## 2020-09-19 ENCOUNTER — Encounter (INDEPENDENT_AMBULATORY_CARE_PROVIDER_SITE_OTHER): Payer: Self-pay | Admitting: Nurse Practitioner

## 2020-09-24 ENCOUNTER — Other Ambulatory Visit: Payer: Self-pay

## 2020-09-24 ENCOUNTER — Ambulatory Visit (INDEPENDENT_AMBULATORY_CARE_PROVIDER_SITE_OTHER): Payer: PPO | Admitting: Nurse Practitioner

## 2020-09-24 VITALS — BP 106/66 | HR 58 | Ht 73.0 in | Wt 329.0 lb

## 2020-09-24 DIAGNOSIS — I890 Lymphedema, not elsewhere classified: Secondary | ICD-10-CM

## 2020-09-24 NOTE — Progress Notes (Signed)
History of Present Illness  There is no documented history at this time  Assessments & Plan   There are no diagnoses linked to this encounter.    Additional instructions  Subjective:  Patient presents with venous ulcer of the Bilateral lower extremity.    Procedure:  3 layer unna wrap was placed Bilateral lower extremity.   Plan:   Follow up in one week.  

## 2020-09-26 ENCOUNTER — Encounter (INDEPENDENT_AMBULATORY_CARE_PROVIDER_SITE_OTHER): Payer: Self-pay | Admitting: Nurse Practitioner

## 2020-10-01 ENCOUNTER — Other Ambulatory Visit: Payer: Self-pay

## 2020-10-01 ENCOUNTER — Encounter (INDEPENDENT_AMBULATORY_CARE_PROVIDER_SITE_OTHER): Payer: Self-pay | Admitting: Nurse Practitioner

## 2020-10-01 ENCOUNTER — Ambulatory Visit (INDEPENDENT_AMBULATORY_CARE_PROVIDER_SITE_OTHER): Payer: PPO | Admitting: Nurse Practitioner

## 2020-10-01 VITALS — BP 109/63 | HR 69 | Ht 73.0 in | Wt 328.0 lb

## 2020-10-01 DIAGNOSIS — I10 Essential (primary) hypertension: Secondary | ICD-10-CM

## 2020-10-01 DIAGNOSIS — L97909 Non-pressure chronic ulcer of unspecified part of unspecified lower leg with unspecified severity: Secondary | ICD-10-CM

## 2020-10-01 DIAGNOSIS — I83009 Varicose veins of unspecified lower extremity with ulcer of unspecified site: Secondary | ICD-10-CM | POA: Diagnosis not present

## 2020-10-01 DIAGNOSIS — E782 Mixed hyperlipidemia: Secondary | ICD-10-CM

## 2020-10-01 MED ORDER — CIPROFLOXACIN HCL 500 MG PO TABS: 500.0000 mg | ORAL_TABLET | Freq: Two times a day (BID) | ORAL | 0 refills | Status: DC

## 2020-10-01 MED ORDER — FUROSEMIDE 20 MG PO TABS: 20.0000 mg | ORAL_TABLET | Freq: Every day | ORAL | 1 refills | Status: DC

## 2020-10-04 NOTE — Progress Notes (Signed)
Subjective:    Patient ID: [name], male    DOB: 07-08-1950, 70 y.o.   MRN: [id number] Chief Complaint  Patient presents with  . Follow-up    B il unna boot check    The patient presents today for follow-up evaluation of lymphedema and bilateral Unna wraps.  The patient continues to have a copiously weeping area from his left medial ankle. The patient notes that this wound has a foul-smelling odor with weeping.  The patient notes that the weeping has been worse over this last few weeks.  However it does not appear to be red or inflamed or cellulitic.  The right lower extremity has small ulcerative areas but those are healed.  The patient is worse edema is in his feet.  The patient continues to refuse evaluation by cardiology.  The patient notes his continued difficulty with elevation and use of lymphedema pump.  They denies any fever or chills.    Review of Systems  Cardiovascular: Positive for leg swelling.  All other systems reviewed and are negative.      Objective:   Physical Exam Vitals reviewed.  HENT:     Head: Normocephalic.  Cardiovascular:     Rate and Rhythm: Normal rate.  Pulmonary:     Effort: Pulmonary effort is normal.  Musculoskeletal:     Right lower leg: Edema present.     Left lower leg: Edema present.  Skin:    Comments: Weeping LLE  Neurological:     Mental Status: He is alert and oriented to person, place, and time.  Psychiatric:        Mood and Affect: Mood normal.        Behavior: Behavior normal.        Thought Content: Thought content normal.        Judgment: Judgment normal.     BP 109/63   Pulse 69   Ht 6\' 1"  (1.854 m)   Wt (!) 328 lb (148.8 kg)   BMI 43.27 kg/m   Past Medical History:  Diagnosis Date  . Diabetes mellitus without complication ([place])   . Hyperlipidemia   . Hypertension   . Leukemia [facility])     Social History   Socioeconomic History  . Marital status: Divorced    Spouse name: Not on file  . Number of  children: Not on file  . Years of education: Not on file  . Highest education level: Not on file  Occupational History  . Not on file  Tobacco Use  . Smoking status: Former Research scientist (life sciences)  . Smokeless tobacco: Never Used  Substance and Sexual Activity  . Alcohol use: No  . Drug use: No  . Sexual activity: Not on file  Other Topics Concern  . Not on file  Social History Narrative  . Not on file   Social Determinants of Health   Financial Resource Strain: Not on file  Food Insecurity: Not on file  Transportation Needs: Not on file  Physical Activity: Not on file  Stress: Not on file  Social Connections: Not on file  Intimate Partner Violence: Not on file    Past Surgical History:  Procedure Laterality Date  . CATARACT EXTRACTION Right   . TONSILLECTOMY    . TOOTH EXTRACTION      Family History  Problem Relation Age of Onset  . Cancer Mother   . Stroke Father   . Cancer Sister     No Known Allergies  No flowsheet data found.  CMP  No results found for: NA, K, CL, CO2, GLUCOSE, BUN, CREATININE, CALCIUM, PROT, ALBUMIN, AST, ALT, ALKPHOS, BILITOT, GFRNONAA, GFRAA   No results found.     Assessment & Plan:   1. Venous ulcer ([place]) The drainage from the patient's wound has significantly increased.  We will treat with Cipro as this is had a positive effect in controlling some of the weeping.  We have also sent in a refill of his Lasix as the patient notes that he is nearly out.  The patient will need to continue with Lasix and I suspect part of this has to do with fluid overload.  The patient will continue to be placed in [place] wraps changed on a weekly basis.  We will have him return to the office in 4 weeks for reevaluation. - ciprofloxacin (CIPRO) 500 MG tablet; Take 1 tablet (500 mg total) by mouth 2 (two) times daily.  Dispense: 28 tablet; Refill: 0  2. Essential hypertension Continue antihypertensive medications as already ordered, these medications have been  reviewed and there are no changes at this time.   3. Mixed hyperlipidemia Continue statin as ordered and reviewed, no changes at this time    Current Outpatient Medications on File Prior to Visit  Medication Sig Dispense Refill  . bisoprolol (ZEBETA) 5 MG tablet Take 1 tablet by mouth daily.    . bisoprolol-hydrochlorothiazide (ZIAC) 10-6.25 MG tablet Take 1 tablet by mouth daily.    . Cholecalciferol 25 MCG (1000 UT) tablet Take 2 tablets by mouth daily.    . hydrALAZINE (APRESOLINE) 10 MG tablet Take 10 mg by mouth 3 (three) times daily.    Marland Kitchen lisinopril (ZESTRIL) 40 MG tablet TAKE ONE TABLET BY MOUTH EVERY DAY FOR BLOOD PRESSURE **REPLACES LISINOPRIL/HCTZ    . lisinopril-hydrochlorothiazide (PRINZIDE,ZESTORETIC) 20-12.5 MG tablet Take 1 tablet by mouth daily.    . metFORMIN (GLUCOPHAGE) 1000 MG tablet Take 1,000 mg by mouth 2 (two) times daily with a meal.    . nystatin (MYCOSTATIN/NYSTOP) powder Apply to toe crevices with unna wrap changes 30 g 0  . pioglitazone (ACTOS) 15 MG tablet Take 15 mg by mouth daily.    . pravastatin (PRAVACHOL) 80 MG tablet Take 40 mg by mouth daily. daily    . sertraline (ZOLOFT) 100 MG tablet Take 100 mg by mouth daily.     . silver sulfADIAZINE (SILVADENE) 1 % cream Apply to left ankle with unna wrap changes 400 g 0   No current facility-administered medications on file prior to visit.    There are no Patient Instructions on file for this visit. No follow-ups on file.   Kris Hartmann, NP

## 2020-10-05 ENCOUNTER — Encounter (INDEPENDENT_AMBULATORY_CARE_PROVIDER_SITE_OTHER): Payer: Self-pay | Admitting: Nurse Practitioner

## 2020-10-08 ENCOUNTER — Other Ambulatory Visit: Payer: Self-pay

## 2020-10-08 ENCOUNTER — Ambulatory Visit (INDEPENDENT_AMBULATORY_CARE_PROVIDER_SITE_OTHER): Payer: PPO | Admitting: Nurse Practitioner

## 2020-10-08 VITALS — BP 96/57 | HR 65 | Ht 73.0 in | Wt 324.0 lb

## 2020-10-08 DIAGNOSIS — I83009 Varicose veins of unspecified lower extremity with ulcer of unspecified site: Secondary | ICD-10-CM | POA: Diagnosis not present

## 2020-10-08 DIAGNOSIS — L97909 Non-pressure chronic ulcer of unspecified part of unspecified lower leg with unspecified severity: Secondary | ICD-10-CM | POA: Diagnosis not present

## 2020-10-08 NOTE — Progress Notes (Signed)
History of Present Illness  There is no documented history at this time  Assessments & Plan   There are no diagnoses linked to this encounter.    Additional instructions  Subjective:  Patient presents with venous ulcer of the Bilateral lower extremity.    Procedure:  3 layer unna wrap was placed Bilateral lower extremity.   Plan:   Follow up in one week.  

## 2020-10-11 ENCOUNTER — Encounter (INDEPENDENT_AMBULATORY_CARE_PROVIDER_SITE_OTHER): Payer: Self-pay | Admitting: Nurse Practitioner

## 2020-10-15 ENCOUNTER — Other Ambulatory Visit: Payer: Self-pay

## 2020-10-15 ENCOUNTER — Ambulatory Visit (INDEPENDENT_AMBULATORY_CARE_PROVIDER_SITE_OTHER): Payer: PPO | Admitting: Nurse Practitioner

## 2020-10-15 VITALS — BP 84/45 | HR 61 | Ht 73.0 in | Wt 322.0 lb

## 2020-10-15 DIAGNOSIS — I83009 Varicose veins of unspecified lower extremity with ulcer of unspecified site: Secondary | ICD-10-CM

## 2020-10-15 DIAGNOSIS — L97909 Non-pressure chronic ulcer of unspecified part of unspecified lower leg with unspecified severity: Secondary | ICD-10-CM

## 2020-10-15 NOTE — Progress Notes (Signed)
History of Present Illness  There is no documented history at this time  Assessments & Plan   There are no diagnoses linked to this encounter.    Additional instructions  Subjective:  Patient presents with venous ulcer of the Bilateral lower extremity.    Procedure:  3 layer unna wrap was placed Bilateral lower extremity.   Plan:   Follow up in one week.  

## 2020-10-18 ENCOUNTER — Encounter (INDEPENDENT_AMBULATORY_CARE_PROVIDER_SITE_OTHER): Payer: Self-pay | Admitting: Nurse Practitioner

## 2020-10-22 ENCOUNTER — Encounter (INDEPENDENT_AMBULATORY_CARE_PROVIDER_SITE_OTHER): Payer: Self-pay

## 2020-10-22 ENCOUNTER — Other Ambulatory Visit: Payer: Self-pay

## 2020-10-22 ENCOUNTER — Ambulatory Visit (INDEPENDENT_AMBULATORY_CARE_PROVIDER_SITE_OTHER): Payer: PPO | Admitting: Nurse Practitioner

## 2020-10-22 VITALS — BP 92/55 | HR 69 | Resp 16 | Wt 322.0 lb

## 2020-10-22 DIAGNOSIS — I83009 Varicose veins of unspecified lower extremity with ulcer of unspecified site: Secondary | ICD-10-CM | POA: Diagnosis not present

## 2020-10-22 DIAGNOSIS — L97909 Non-pressure chronic ulcer of unspecified part of unspecified lower leg with unspecified severity: Secondary | ICD-10-CM

## 2020-10-22 NOTE — Progress Notes (Signed)
History of Present Illness  There is no documented history at this time  Assessments & Plan   There are no diagnoses linked to this encounter.    Additional instructions  Subjective:  Patient presents with venous ulcer of the Bilateral lower extremity.    Procedure:  3 layer unna wrap was placed Bilateral lower extremity.   Plan:   Follow up in one week.       Applied kerlix and aquacel with [employer]

## 2020-10-29 ENCOUNTER — Ambulatory Visit (INDEPENDENT_AMBULATORY_CARE_PROVIDER_SITE_OTHER): Payer: PPO | Admitting: Vascular Surgery

## 2020-10-29 ENCOUNTER — Other Ambulatory Visit: Payer: Self-pay

## 2020-10-29 ENCOUNTER — Encounter (INDEPENDENT_AMBULATORY_CARE_PROVIDER_SITE_OTHER): Payer: Self-pay | Admitting: Vascular Surgery

## 2020-10-29 VITALS — BP 100/64 | HR 65 | Resp 16 | Wt 320.8 lb

## 2020-10-29 DIAGNOSIS — E782 Mixed hyperlipidemia: Secondary | ICD-10-CM | POA: Diagnosis not present

## 2020-10-29 DIAGNOSIS — I87313 Chronic venous hypertension (idiopathic) with ulcer of bilateral lower extremity: Secondary | ICD-10-CM | POA: Diagnosis not present

## 2020-10-29 DIAGNOSIS — I890 Lymphedema, not elsewhere classified: Secondary | ICD-10-CM

## 2020-10-29 DIAGNOSIS — I83009 Varicose veins of unspecified lower extremity with ulcer of unspecified site: Secondary | ICD-10-CM

## 2020-10-29 DIAGNOSIS — E11628 Type 2 diabetes mellitus with other skin complications: Secondary | ICD-10-CM | POA: Diagnosis not present

## 2020-10-29 DIAGNOSIS — L97909 Non-pressure chronic ulcer of unspecified part of unspecified lower leg with unspecified severity: Secondary | ICD-10-CM

## 2020-10-29 DIAGNOSIS — I872 Venous insufficiency (chronic) (peripheral): Secondary | ICD-10-CM

## 2020-10-29 NOTE — Progress Notes (Signed)
MRN : [id number]  [name] is a 70 y.o. (05/24/1950) male who presents with chief complaint of  Chief Complaint  Patient presents with   Follow-up    4 wk unna boot check  .  History of Present Illness:    The patient presents today for Unna wrap check.  The patient continues to have excessive pedal edema and although is about the same compared to his last visit the color is much less red and less angry appearing.  The patient does not have any blisters however he continues to have a shallow ulceration of the left medial ankle and the left upper shin.  The right  lower extremity is improved and doesn't have any ulcers.  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  The patient is able to utilize his lymphedema pump two time a day but doesn't feel it is much help.  He is adherent with his Unna wraps.  The patient is somewhat active.  He denies any fever, chills or syncope.  The patient's left lower extremity is somewhat concerning for cellulitis as he has had some increased redness and discomfort.  Current Meds  Medication Sig   bisoprolol (ZEBETA) 5 MG tablet Take 1 tablet by mouth daily.   bisoprolol-hydrochlorothiazide (ZIAC) 10-6.25 MG tablet Take 1 tablet by mouth daily.   Cholecalciferol 25 MCG (1000 UT) tablet Take 2 tablets by mouth daily.   furosemide (LASIX) 20 MG tablet Take 1 tablet (20 mg total) by mouth daily.   hydrALAZINE (APRESOLINE) 10 MG tablet Take 10 mg by mouth 3 (three) times daily.   lisinopril (ZESTRIL) 40 MG tablet TAKE ONE TABLET BY MOUTH EVERY DAY FOR BLOOD PRESSURE **REPLACES LISINOPRIL/HCTZ   lisinopril-hydrochlorothiazide (PRINZIDE,ZESTORETIC) 20-12.5 MG tablet Take 1 tablet by mouth daily.   metFORMIN (GLUCOPHAGE) 1000 MG tablet Take 1,000 mg by mouth 2 (two) times daily with a meal.   nystatin (MYCOSTATIN/NYSTOP) powder Apply to toe crevices with unna wrap changes    pioglitazone (ACTOS) 15 MG tablet Take 15 mg by mouth daily.   pravastatin (PRAVACHOL) 80 MG tablet Take 40 mg by mouth daily. daily   sertraline (ZOLOFT) 100 MG tablet Take 100 mg by mouth daily.     Past Medical History:  Diagnosis Date   Diabetes mellitus without complication ([place])    Hyperlipidemia    Hypertension    Leukemia ([place])     Past Surgical History:  Procedure Laterality Date   CATARACT EXTRACTION Right    TONSILLECTOMY     TOOTH EXTRACTION      Social History Social History   Tobacco Use   Smoking status: Former    Pack years: 0.00   Smokeless tobacco: Never  Substance Use Topics   Alcohol use: No   Drug use: No    Family History Family History  Problem Relation Age of Onset   Cancer Mother    Stroke Father    Cancer Sister     No Known Allergies   REVIEW OF SYSTEMS (Negative unless checked)  Constitutional: [] Weight loss  [] Fever  [] Chills Cardiac: [] Chest pain   [] Chest pressure   [] Palpitations   [] Shortness of breath when laying flat   [] Shortness of breath with exertion. Vascular:  [] Pain in legs with walking   [x] Pain in legs at rest  [] History of DVT   [] Phlebitis   [x] Swelling in legs   [] Varicose veins   [  x]Non-healing ulcers Pulmonary:   [] Uses home oxygen   [] Productive cough   [] Hemoptysis   [] Wheeze  [] COPD   [] Asthma Neurologic:  [] Dizziness   [] Seizures   [] History of stroke   [] History of TIA  [] Aphasia   [] Vissual changes   [] Weakness or numbness in arm   [] Weakness or numbness in leg Musculoskeletal:   [] Joint swelling   [] Joint pain   [] Low back pain Hematologic:  [] Easy bruising  [] Easy bleeding   [] Hypercoagulable state   [] Anemic Gastrointestinal:  [] Diarrhea   [] Vomiting  [] Gastroesophageal reflux/heartburn   [] Difficulty swallowing. Genitourinary:  [] Chronic kidney disease   [] Difficult urination  [] Frequent urination   [] Blood in urine Skin:  [x] Rashes   [x] Ulcers  Psychological:  [] History of anxiety   []  History of major  depression.  Physical Examination  Vitals:   10/29/20 1018  BP: 100/64  Pulse: 65  Resp: 16  Weight: (!) 320 lb 12.8 oz (145.5 kg)   Body mass index is 42.32 kg/m. Gen: WD/WN, NAD Head: [place]/AT, No temporalis wasting.  Ear/Nose/Throat: Hearing grossly intact, nares w/o erythema or drainage Eyes: PER, EOMI, sclera nonicteric.  Neck: Supple, no large masses.   Pulmonary:  Good air movement, no audible wheezing bilaterally, no use of accessory muscles.  Cardiac: RRR, no JVD  Vascular:  scattered varicosities present bilaterally.  Profound venous stasis changes to the legs bilaterally.  Massive soft pitting edema bilateral ulcers Vessel Right Left  Radial Palpable Palpable  PT Palpable Palpable  DP Palpable Palpable  Gastrointestinal: Non-distended. No guarding/no peritoneal signs.  Musculoskeletal: M/S 5/5 throughout.  No deformity or atrophy.  Neurologic: CN 2-12 intact. Symmetrical.  Speech is fluent. Motor exam as listed above. Psychiatric: Judgment intact, Mood & affect appropriate for pt's clinical situation. Dermatologic: Profound venous rashes with bilateral ulcers noted.  No changes consistent with cellulitis. Lymph : Severe lichenification and skin changes of chronic lymphedema.  CBC No results found for: WBC, HGB, HCT, MCV, PLT  BMET No results found for: NA, K, CL, CO2, GLUCOSE, BUN, CREATININE, CALCIUM, GFRNONAA, GFRAA CrCl cannot be calculated (No successful lab value found.).  COAG No results found for: INR, PROTIME  Radiology No results found.   Assessment/Plan 1. Venous ulcer ([place]) No surgery or intervention at this point in time.     I have reviewed my discussion with the patient regarding lymphedema and why it  causes symptoms.  Patient will continue wearing Unna boots but compression wraps were discussed and a prescription was given. The patient is reminded to put the wraps on first thing in the morning and removing them in the evening. The patient is  instructed specifically not to sleep in the wraps.   In addition, behavioral modification throughout the day will be continued.  This will include frequent elevation (such as in a recliner), use of over the counter pain medications as needed and exercise such as walking.   I have reviewed systemic causes for chronic edema such as liver, kidney and cardiac etiologies and there does not appear to be any significant changes in these organ systems over the past year.  The patient is under the impression that these organ systems are all stable and unchanged.     The patient will continue aggressive use of the  lymph pump.  This will continue to improve the edema control and prevent sequela such as ulcers and infections.  2. Chronic venous insufficiency No surgery or intervention at this point in time.     I  have reviewed my discussion with the patient regarding lymphedema and why it  causes symptoms.  Patient will continue wearing Unna boots but compression wraps were discussed and a prescription was given. The patient is reminded to put the wraps on first thing in the morning and removing them in the evening. The patient is instructed specifically not to sleep in the wraps.   In addition, behavioral modification throughout the day will be continued.  This will include frequent elevation (such as in a recliner), use of over the counter pain medications as needed and exercise such as walking.   I have reviewed systemic causes for chronic edema such as liver, kidney and cardiac etiologies and there does not appear to be any significant changes in these organ systems over the past year.  The patient is under the impression that these organ systems are all stable and unchanged.     The patient will continue aggressive use of the  lymph pump.  This will continue to improve the edema control and prevent sequela such as ulcers and infections.  3. Lymphedema No surgery or intervention at this point in time.      I have reviewed my discussion with the patient regarding lymphedema and why it  causes symptoms.  Patient will continue wearing Unna boots but compression wraps were discussed and a prescription was given. The patient is reminded to put the wraps on first thing in the morning and removing them in the evening. The patient is instructed specifically not to sleep in the wraps.   In addition, behavioral modification throughout the day will be continued.  This will include frequent elevation (such as in a recliner), use of over the counter pain medications as needed and exercise such as walking.   I have reviewed systemic causes for chronic edema such as liver, kidney and cardiac etiologies and there does not appear to be any significant changes in these organ systems over the past year.  The patient is under the impression that these organ systems are all stable and unchanged.     The patient will continue aggressive use of the  lymph pump.  This will continue to improve the edema control and prevent sequela such as ulcers and infections.  4. Mixed hyperlipidemia Continue statin as ordered and reviewed, no changes at this time   5. Type 2 diabetes mellitus with other skin complication, without long-term current use of insulin (HCC) Continue hypoglycemic medications as already ordered, these medications have been reviewed and there are no changes at this time.  Hgb A1C to be monitored as already arranged by primary service    Hortencia Pilar, MD  10/29/2020 10:36 AM

## 2020-11-02 ENCOUNTER — Ambulatory Visit: Payer: PPO | Admitting: Podiatry

## 2020-11-02 ENCOUNTER — Encounter (INDEPENDENT_AMBULATORY_CARE_PROVIDER_SITE_OTHER): Payer: Self-pay | Admitting: Vascular Surgery

## 2020-11-05 ENCOUNTER — Other Ambulatory Visit: Payer: Self-pay

## 2020-11-05 ENCOUNTER — Encounter (INDEPENDENT_AMBULATORY_CARE_PROVIDER_SITE_OTHER): Payer: Self-pay

## 2020-11-05 ENCOUNTER — Ambulatory Visit (INDEPENDENT_AMBULATORY_CARE_PROVIDER_SITE_OTHER): Payer: PPO | Admitting: Nurse Practitioner

## 2020-11-05 VITALS — BP 95/49 | HR 75 | Resp 16 | Wt 315.0 lb

## 2020-11-05 DIAGNOSIS — L97909 Non-pressure chronic ulcer of unspecified part of unspecified lower leg with unspecified severity: Secondary | ICD-10-CM

## 2020-11-05 DIAGNOSIS — I83009 Varicose veins of unspecified lower extremity with ulcer of unspecified site: Secondary | ICD-10-CM

## 2020-11-05 MED ORDER — LEVOFLOXACIN 750 MG PO TABS: 750.0000 mg | ORAL_TABLET | Freq: Every day | ORAL | 0 refills | Status: DC

## 2020-11-05 NOTE — Progress Notes (Signed)
History of Present Illness  There is no documented history at this time  Assessments & Plan   There are no diagnoses linked to this encounter.    Additional instructions  Subjective:  Patient presents with venous ulcer of the Bilateral lower extremity.    Procedure:  3 layer unna wrap was placed Bilateral lower extremity.   Plan:   Follow up in one week.  

## 2020-11-12 ENCOUNTER — Other Ambulatory Visit: Payer: Self-pay

## 2020-11-12 ENCOUNTER — Ambulatory Visit: Payer: PPO | Admitting: Podiatry

## 2020-11-12 ENCOUNTER — Encounter: Payer: Self-pay | Admitting: Podiatry

## 2020-11-12 ENCOUNTER — Ambulatory Visit (INDEPENDENT_AMBULATORY_CARE_PROVIDER_SITE_OTHER): Payer: PPO | Admitting: Nurse Practitioner

## 2020-11-12 ENCOUNTER — Telehealth: Payer: Self-pay | Admitting: *Deleted

## 2020-11-12 ENCOUNTER — Encounter (INDEPENDENT_AMBULATORY_CARE_PROVIDER_SITE_OTHER): Payer: PPO

## 2020-11-12 VITALS — BP 95/47 | HR 75 | Resp 17 | Ht 73.0 in | Wt 313.0 lb

## 2020-11-12 DIAGNOSIS — E11628 Type 2 diabetes mellitus with other skin complications: Secondary | ICD-10-CM

## 2020-11-12 DIAGNOSIS — I872 Venous insufficiency (chronic) (peripheral): Secondary | ICD-10-CM | POA: Diagnosis not present

## 2020-11-12 DIAGNOSIS — I83009 Varicose veins of unspecified lower extremity with ulcer of unspecified site: Secondary | ICD-10-CM

## 2020-11-12 DIAGNOSIS — L97909 Non-pressure chronic ulcer of unspecified part of unspecified lower leg with unspecified severity: Secondary | ICD-10-CM | POA: Diagnosis not present

## 2020-11-12 DIAGNOSIS — M79675 Pain in left toe(s): Secondary | ICD-10-CM

## 2020-11-12 DIAGNOSIS — M79674 Pain in right toe(s): Secondary | ICD-10-CM

## 2020-11-12 DIAGNOSIS — B351 Tinea unguium: Secondary | ICD-10-CM

## 2020-11-12 NOTE — Progress Notes (Signed)
This patient returns to my office for at risk foot care.  This patient requires this care by a professional since this patient will be at risk due to having diabetes and venous insufficiency.  This patient is unable to cut nails himself since the patient cannot reach his nails.These nails are painful walking and wearing shoes.  Patient is wearing unna boots  B/L for his severe lymphedema.. This patient presents for at risk foot care today.  General Appearance  Alert, conversant and in no acute stress.  Vascular  Deferred due to unna boots.  Neurologic  Deferred due to unna boots.  Nails Thick disfigured discolored nails with subungual debris  from hallux to fifth toes bilaterally. No evidence of bacterial infection or drainage bilaterally.  Orthopedic  No limitations of motion  feet .  No crepitus or effusions noted.  No bony pathology or digital deformities noted.  Skin  normotropic skin with no porokeratosis noted bilaterally.  No signs of infections or ulcers noted.     Onychomycosis  Pain in right toes  Pain in left toes  Consent was obtained for treatment procedures.   Mechanical debridement of nails 1-5  bilaterally performed with a nail nipper.  Filed with dremel without incident.  Patient says he has an appointment tomorrow and may be hospitalized for a few weeks.   Return office visit    3 months                 Told patient to return for periodic foot care and evaluation due to potential at risk complications.   Gardiner [name] DPM

## 2020-11-12 NOTE — Progress Notes (Signed)
[name] fell in the parking lot after leaving from his appointment.  He stepped off the curve by his Lucianne Lei and lost his balance.  We were informed by another patient.  [name] grabbed a wheel chair and went out to assess the situation.  He was assisted into the wheel chair with the by [name], [name], and the caregiver, [name], of another patient before I could get to the patient.  I assessed him as well as [name].   He has an abrasion on his right knee.  He did not have any other injuries.  He stated that he was okay just a little tired.  [name] cleansed the wound and applied triple antibiotic ointment and a dry sterile dressing.  [name] was able to stand with a little assistance.  I asked if he was okay to drive.  He stated he was. He got into his vehicle and left.  I told him I would call him later to check on him.  I advised him to go to the emergency room if he had any other problems.

## 2020-11-12 NOTE — Progress Notes (Signed)
History of Present Illness  There is no documented history at this time  Assessments & Plan   There are no diagnoses linked to this encounter.    Additional instructions  Subjective:  Patient presents with venous ulcer of the Bilateral lower extremity.    Procedure:  3 layer unna wrap was placed Bilateral lower extremity.   Plan:   Follow up in one week.  

## 2020-11-12 NOTE — Telephone Encounter (Addendum)
I'm calling to see how you are doing since you had your fall this morning.  "I'm making it alright.  I'm walking with my cane now."  That's great.  Please give [place] a call if you have any questions or concerns.

## 2020-11-15 ENCOUNTER — Encounter (INDEPENDENT_AMBULATORY_CARE_PROVIDER_SITE_OTHER): Payer: Self-pay | Admitting: Nurse Practitioner

## 2020-11-19 ENCOUNTER — Encounter (INDEPENDENT_AMBULATORY_CARE_PROVIDER_SITE_OTHER): Payer: Self-pay | Admitting: Nurse Practitioner

## 2020-11-19 ENCOUNTER — Other Ambulatory Visit: Payer: Self-pay

## 2020-11-19 ENCOUNTER — Ambulatory Visit (INDEPENDENT_AMBULATORY_CARE_PROVIDER_SITE_OTHER): Payer: PPO | Admitting: Nurse Practitioner

## 2020-11-19 VITALS — BP 110/64 | HR 73 | Ht 73.0 in | Wt 316.0 lb

## 2020-11-19 DIAGNOSIS — L97909 Non-pressure chronic ulcer of unspecified part of unspecified lower leg with unspecified severity: Secondary | ICD-10-CM

## 2020-11-19 DIAGNOSIS — I83009 Varicose veins of unspecified lower extremity with ulcer of unspecified site: Secondary | ICD-10-CM | POA: Diagnosis not present

## 2020-11-19 NOTE — Progress Notes (Signed)
History of Present Illness  There is no documented history at this time  Assessments & Plan   There are no diagnoses linked to this encounter.    Additional instructions  Subjective:  Patient presents with venous ulcer of the Bilateral lower extremity.    Procedure:  3 layer unna wrap was placed Bilateral lower extremity.   Plan:   Follow up in one week.  

## 2020-11-26 ENCOUNTER — Encounter (INDEPENDENT_AMBULATORY_CARE_PROVIDER_SITE_OTHER): Payer: Self-pay | Admitting: Nurse Practitioner

## 2020-11-26 ENCOUNTER — Ambulatory Visit (INDEPENDENT_AMBULATORY_CARE_PROVIDER_SITE_OTHER): Payer: PPO | Admitting: Nurse Practitioner

## 2020-11-26 ENCOUNTER — Other Ambulatory Visit: Payer: Self-pay

## 2020-11-26 VITALS — BP 104/60 | HR 84 | Ht 73.0 in | Wt 314.0 lb

## 2020-11-26 DIAGNOSIS — I890 Lymphedema, not elsewhere classified: Secondary | ICD-10-CM | POA: Diagnosis not present

## 2020-11-26 DIAGNOSIS — L97909 Non-pressure chronic ulcer of unspecified part of unspecified lower leg with unspecified severity: Secondary | ICD-10-CM

## 2020-11-26 DIAGNOSIS — E782 Mixed hyperlipidemia: Secondary | ICD-10-CM | POA: Diagnosis not present

## 2020-11-26 DIAGNOSIS — I83009 Varicose veins of unspecified lower extremity with ulcer of unspecified site: Secondary | ICD-10-CM | POA: Diagnosis not present

## 2020-11-26 NOTE — Progress Notes (Signed)
Subjective:    Patient ID: [name], male    DOB: Sep 09, 1950, 70 y.o.   MRN: [id number] Chief Complaint  Patient presents with   Follow-up    BIL 4wk unna boot check    [name] is a 70 year old male that presents today for evaluation of his longstanding lymphedema and venous ulcerations.  The patient has an upcoming appointment with cardiology.  He still has swelling of his bilateral lower extremities with the vast majority of the swelling being his feet.  The left is far worse than the right.  The right actually looks somewhat improved today.  He continues to have drainage from his left ulceration.  It is also noted that there are some maggots on the wound today as well.  He denies any fevers or chills.  He is taking the previous antibiotics prescribed to him.  He still continues to try to elevate but that is difficult for him.   Review of Systems  Cardiovascular:  Positive for leg swelling.  Skin:  Positive for wound.  All other systems reviewed and are negative.     Objective:   Physical Exam Vitals reviewed.  HENT:     Head: Normocephalic.  Cardiovascular:     Rate and Rhythm: Normal rate.  Pulmonary:     Effort: Pulmonary effort is normal.  Musculoskeletal:     Right lower leg: 3+ Edema present.     Left lower leg: 4+ Edema present.  Skin:    General: Skin is warm and moist.     Findings: Wound present.  Neurological:     Mental Status: He is alert and oriented to person, place, and time.  Psychiatric:        Mood and Affect: Mood normal.        Behavior: Behavior normal.        Thought Content: Thought content normal.        Judgment: Judgment normal.    BP 104/60   Pulse 84   Ht 6\' 1"  (1.854 m)   Wt (!) 314 lb (142.4 kg)   BMI 41.43 kg/m   Past Medical History:  Diagnosis Date   Diabetes mellitus without complication (HCC)    Hyperlipidemia    Hypertension    Leukemia ([place])     Social History   Socioeconomic History   Marital status:  Divorced    Spouse name: Not on file   Number of children: Not on file   Years of education: Not on file   Highest education level: Not on file  Occupational History   Not on file  Tobacco Use   Smoking status: Former   Smokeless tobacco: Never  Substance and Sexual Activity   Alcohol use: No   Drug use: No   Sexual activity: Not on file  Other Topics Concern   Not on file  Social History Narrative   Not on file   Social Determinants of Health   Financial Resource Strain: Not on file  Food Insecurity: Not on file  Transportation Needs: Not on file  Physical Activity: Not on file  Stress: Not on file  Social Connections: Not on file  Intimate Partner Violence: Not on file    Past Surgical History:  Procedure Laterality Date   CATARACT EXTRACTION Right    TONSILLECTOMY     TOOTH EXTRACTION      Family History  Problem Relation Age of Onset   Cancer Mother    Stroke Father    Cancer  Sister     No Known Allergies  No flowsheet data found.    CMP  No results found for: NA, K, CL, CO2, GLUCOSE, BUN, CREATININE, CALCIUM, PROT, ALBUMIN, AST, ALT, ALKPHOS, BILITOT, GFRNONAA, GFRAA   No results found.     Assessment & Plan:   1. Venous ulcer ([place]) The patient continues to have foul-smelling drainage from his wound.  The patient also had evidence of maggots as well.  We have tried multiple oral antibiotics without any success.  Had a discussion with the patient in regards to placing a PICC line for IV antibiotics versus going to the emergency room and possible admission.  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  Based on this outpatient antibiotics are a better option for him.  We will plan to have a PICC line inserted with IV antibiotics.  We will continue to have him present to the office on a weekly basis for wrap changes and wound evaluation. - Ambulatory referral to [facility]  2. Lymphedema The patient will continue with conservative therapy tactics of compression, elevation and activity.  The patient will hopefully be getting a lift chair and hospital bed so this should also help significantly with his elevation.  3. Mixed hyperlipidemia Continue statin as ordered and reviewed, no changes at this time    Current Outpatient Medications on File Prior to Visit  Medication Sig Dispense Refill   bisoprolol (ZEBETA) 5 MG tablet Take 1 tablet by mouth daily.     bisoprolol-hydrochlorothiazide (ZIAC) 10-6.25 MG tablet Take 1 tablet by mouth daily.     Cholecalciferol 25 MCG (1000 UT) tablet Take 2 tablets by mouth daily.     furosemide (LASIX) 20 MG tablet Take 1 tablet (20 mg total) by mouth daily. 30 tablet 1   hydrALAZINE (APRESOLINE) 10 MG tablet Take 10 mg by mouth 3 (three) times daily.     levofloxacin (LEVAQUIN) 750 MG tablet Take 1 tablet (750 mg total) by mouth daily. 10 tablet 0   lisinopril (ZESTRIL) 40 MG tablet TAKE ONE TABLET BY MOUTH EVERY DAY FOR BLOOD PRESSURE **REPLACES LISINOPRIL/HCTZ     lisinopril-hydrochlorothiazide (PRINZIDE,ZESTORETIC) 20-12.5 MG tablet Take 1 tablet by mouth daily.     metFORMIN (GLUCOPHAGE) 1000 MG tablet Take 1,000 mg by mouth 2 (two) times daily with a meal.     nystatin (MYCOSTATIN/NYSTOP) powder Apply to toe crevices with unna wrap changes 30 g 0   pioglitazone (ACTOS) 15 MG tablet Take 15 mg by mouth daily.     pravastatin (PRAVACHOL) 80 MG tablet Take 40 mg by mouth daily. daily     sertraline (ZOLOFT) 100 MG tablet Take 100 mg by mouth daily.      silver sulfADIAZINE (SILVADENE) 1 % cream Apply to left ankle with unna wrap changes 400 g 0   No current facility-administered medications on file prior to visit.    There are no Patient Instructions on file for this visit. No follow-ups on file.   Kris Hartmann, NP

## 2020-11-27 ENCOUNTER — Other Ambulatory Visit (INDEPENDENT_AMBULATORY_CARE_PROVIDER_SITE_OTHER): Payer: Self-pay | Admitting: Nurse Practitioner

## 2020-11-27 ENCOUNTER — Telehealth (INDEPENDENT_AMBULATORY_CARE_PROVIDER_SITE_OTHER): Payer: Self-pay

## 2020-11-27 MED ORDER — DEXTROSE 5 % IV SOLN: 2.0000 g | Freq: Two times a day (BID) | INTRAVENOUS | Status: DC

## 2020-11-27 NOTE — Telephone Encounter (Signed)
Patient has been schedule for pic line with first antibiotic dose on 12/03/20 at 10:15 . I left a detailed message on the patient voicemail.

## 2020-11-30 ENCOUNTER — Other Ambulatory Visit (INDEPENDENT_AMBULATORY_CARE_PROVIDER_SITE_OTHER): Payer: Self-pay | Admitting: Nurse Practitioner

## 2020-11-30 NOTE — Addendum Note (Signed)
Addended by: Kris Hartmann on: 11/30/2020 02:08 PM   Modules accepted: Orders

## 2020-12-01 ENCOUNTER — Other Ambulatory Visit: Payer: Self-pay

## 2020-12-01 ENCOUNTER — Encounter: Payer: Self-pay | Admitting: Cardiovascular Disease

## 2020-12-01 ENCOUNTER — Ambulatory Visit: Payer: PPO | Admitting: Cardiovascular Disease

## 2020-12-01 VITALS — BP 106/60 | HR 68 | Ht 73.0 in | Wt 312.1 lb

## 2020-12-01 DIAGNOSIS — R600 Localized edema: Secondary | ICD-10-CM | POA: Diagnosis not present

## 2020-12-01 DIAGNOSIS — I890 Lymphedema, not elsewhere classified: Secondary | ICD-10-CM | POA: Diagnosis not present

## 2020-12-01 NOTE — Patient Instructions (Addendum)
Medication Instructions:  No changes  If you need a refill on your cardiac medications before your next appointment, please call your pharmacy.    Lab work: No new labs needed   If you have labs (blood work) drawn today and your tests are completely normal, you will receive your results only by: [facility] (if you have MyChart) OR A paper copy in the mail If you have any lab test that is abnormal or we need to change your treatment, we will call you to review the results.   Testing/Procedures: - Your physician has requested that you have an echocardiogram. Echocardiography is a painless test that uses sound waves to create images of your heart. It provides your doctor with information about the size and shape of your heart and how well your heart's chambers and valves are working. This procedure takes approximately one hour. There are no restrictions for this procedure. There is a possibility that an IV may need to be started during your test to inject an image enhancing agent. This is done to obtain more optimal pictures of your heart. Therefore we ask that you do at least drink some water prior to coming in to hydrate your veins.     Follow-Up: At [facility], you and your health needs are our priority.  As part of our continuing mission to provide you with exceptional heart care, we have created designated Provider Care Teams.  These Care Teams include your primary Cardiologist (physician) and Advanced Practice Providers (APPs -  Physician Assistants and Nurse Practitioners) who all work together to provide you with the care you need, when you need it.  You will need a follow up appointment as needed  Providers on your designated Care Team:   Murray Hodgkins, NP Christell Faith, PA-C Marrianne Mood, PA-C Cadence Kathlen Mody, [place]  Any Other Special Instructions Will Be Listed Below (If Applicable).  COVID-19 Vaccine Information can be found at:  [email or web address] For questions related to vaccine distribution or appointments, please email [email or web address] or call [phone].

## 2020-12-01 NOTE — Progress Notes (Signed)
Cardiology Office Note  Date:  12/01/2020   ID:  [name], DOB January 30, 1951, MRN [id number]  PCP:  [facility]   Chief Complaint  Patient presents with   Other    Lymphedema. Meds reviewed verbally with pt.    HPI:  Mr. [name] is a 70 year old gentleman with history of Lymphedema, Venous ulcer, followed by the wound clinic and vascular surgery General debility Hyperlipidemia Presented by self-referral for evaluation of his leg swelling  Reports he has not had cardiac issues in the past, has never had work-up Is concerned that a cardiac issue may be contributing to his lower extremity swelling He was referred from the wound clinic over 1 year ago to vascular Vascular surgery has been helping to manage his venous ulcer in his lymphedema  Most recent notes from vascular reviewed detailing foul-smelling drainage from his wound, oral antibiotics have been tried, It has been recommended he have a PICC line with IV antibiotics, this is scheduled for next week Reports constant drainage that soaks his dressing changes Has dressing changed once a week reports it will be wet within a couple of hours  Reports compliance with his lymphedema pumps Notes from vascular indicating request to continue compression, elevation and attempts be made for lift chair/hospital bed  He denies any significant chest pain concerning for angina Reports that he was tried on Lasix every other day by the [place], this was increased by vascular to daily Lasix held after creatinine up to 2.6 Scheduled to go to the [place] for repeat lab work  EKG personally reviewed by myself on todays visit Normal sinus rhythm rate 69 bpm no significant ST-T wave changes   PMH:   has a past medical history of Diabetes mellitus without complication ([place]), Hyperlipidemia, Hypertension, and Leukemia ([place]).  PSH:    Past Surgical History:  Procedure Laterality Date   CATARACT EXTRACTION Right    TONSILLECTOMY      TOOTH EXTRACTION      Current Outpatient Medications  Medication Sig Dispense Refill   bisoprolol (ZEBETA) 5 MG tablet Take 1 tablet by mouth daily.     Cholecalciferol 25 MCG (1000 UT) tablet Take 2 tablets by mouth daily.     hydrALAZINE (APRESOLINE) 10 MG tablet Take 10 mg by mouth 3 (three) times daily.     levofloxacin (LEVAQUIN) 750 MG tablet Take 1 tablet (750 mg total) by mouth daily. 10 tablet 0   lisinopril (ZESTRIL) 40 MG tablet TAKE ONE TABLET BY MOUTH EVERY DAY FOR BLOOD PRESSURE **REPLACES LISINOPRIL/HCTZ     metFORMIN (GLUCOPHAGE) 1000 MG tablet Take 1,000 mg by mouth 2 (two) times daily with a meal.     nystatin (MYCOSTATIN/NYSTOP) powder Apply to toe crevices with unna wrap changes 30 g 0   pioglitazone (ACTOS) 15 MG tablet Take 15 mg by mouth daily.     pravastatin (PRAVACHOL) 80 MG tablet Take 40 mg by mouth daily. daily     sertraline (ZOLOFT) 100 MG tablet Take 100 mg by mouth daily.      silver sulfADIAZINE (SILVADENE) 1 % cream Apply to left ankle with unna wrap changes 400 g 0   Current Facility-Administered Medications  Medication Dose Route Frequency Provider Last Rate Last Admin   ceFEPIme (MAXIPIME) 2 g in dextrose 5 % 50 mL IVPB  2 g Intravenous Q12H Kris Hartmann, NP         Allergies:   Patient has no known allergies.   Social History:  The patient  reports that he has quit smoking. He has never used smokeless tobacco. He reports that he does not drink alcohol and does not use drugs.   Family History:   family history includes Cancer in his mother and sister; Stroke in his father.    Review of Systems: Review of Systems  Constitutional: Negative.   HENT: Negative.    Respiratory: Negative.    Cardiovascular:  Positive for leg swelling.  Gastrointestinal: Negative.   Musculoskeletal: Negative.   Neurological: Negative.   Psychiatric/Behavioral: Negative.    All other systems reviewed and are negative.   PHYSICAL EXAM: VS:  BP 106/60 (BP  Location: Left Arm, Patient Position: Sitting, Cuff Size: Large)   Pulse 68   Ht '6\' 1"'$  (1.854 m)   Wt (!) 312 lb 2 oz (141.6 kg)   SpO2 96%   BMI 41.18 kg/m  , BMI Body mass index is 41.18 kg/m. GEN: Well nourished, well developed, in no acute distress, presenting in a wheelchair HEENT: normal Neck: no JVD, carotid bruits, or masses Cardiac: RRR; no murmurs, rubs, or gallops, Leg wraps in place bilaterally, massive swelling, wraps are wet from drainage Respiratory:  clear to auscultation bilaterally, normal work of breathing GI: soft, nontender, nondistended, + BS MS: no deformity or atrophy Skin: warm and dry, no rash Neuro:  Strength and sensation are intact Psych: euthymic mood, full affect   Recent Labs: No results found for requested labs within last 8760 hours.    Lipid Panel No results found for: CHOL, HDL, LDLCALC, TRIG    Wt Readings from Last 3 Encounters:  12/01/20 (!) 312 lb 2 oz (141.6 kg)  11/26/20 (!) 314 lb (142.4 kg)  11/19/20 (!) 316 lb (143.3 kg)       ASSESSMENT AND PLAN:  Problem List Items Addressed This Visit   None Visit Diagnoses     Lower extremity edema    -  Primary   Relevant Orders   EKG 12-Lead   ECHOCARDIOGRAM COMPLETE       Leg swelling/lymphedema Severe disease bilateral, wraps in place that are soaked, left greater than right Has a venous ulcer left lower extremity managed by vascular surgery Recommend close follow-up with the wound clinic as well Plan for PICC line, IV antibiotics per vascular notes He has failed oral antibiotics Echocardiogram has been ordered to exclude cardiac etiology as a contributor to his swelling,.  Discussed need to evaluate ejection fraction, valvular heart disease, pulmonary hypertension  General debility Limited secondary to lymphedema Ongoing worsening symptoms past 2 years, also followed at the [facility]    Total encounter time more than 45 minutes  Greater than 50% was spent in  counseling and coordination of care with the patient    Signed, Esmond Plants, M.D., Ph.D. [place], [place]

## 2020-12-03 ENCOUNTER — Encounter (INDEPENDENT_AMBULATORY_CARE_PROVIDER_SITE_OTHER): Payer: Self-pay | Admitting: Nurse Practitioner

## 2020-12-03 ENCOUNTER — Ambulatory Visit
Admission: RE | Admit: 2020-12-03 | Discharge: 2020-12-03 | Disposition: A | Discharge status: Home / Self Care | Payer: PPO | Source: Ambulatory Visit | Attending: Nurse Practitioner | Admitting: Nurse Practitioner

## 2020-12-03 ENCOUNTER — Ambulatory Visit
Admission: RE | Admit: 2020-12-03 | Discharge: 2020-12-03 | Disposition: A | Discharge status: Home / Self Care | Payer: Self-pay | Source: Ambulatory Visit | Attending: Nurse Practitioner | Admitting: Nurse Practitioner

## 2020-12-03 ENCOUNTER — Ambulatory Visit (INDEPENDENT_AMBULATORY_CARE_PROVIDER_SITE_OTHER): Payer: PPO | Admitting: Nurse Practitioner

## 2020-12-03 ENCOUNTER — Encounter: Payer: Self-pay | Admitting: Internal Medicine

## 2020-12-03 ENCOUNTER — Observation Stay
Admission: EM | Admit: 2020-12-03 | Discharge: 2020-12-04 | Disposition: A | Discharge status: Home / Self Care | Payer: No Typology Code available for payment source | Attending: Internal Medicine | Admitting: Internal Medicine

## 2020-12-03 ENCOUNTER — Other Ambulatory Visit: Payer: Self-pay

## 2020-12-03 VITALS — BP 107/62 | HR 74 | Resp 16 | Wt 309.4 lb

## 2020-12-03 DIAGNOSIS — L97909 Non-pressure chronic ulcer of unspecified part of unspecified lower leg with unspecified severity: Secondary | ICD-10-CM

## 2020-12-03 DIAGNOSIS — I1310 Hypertensive heart and chronic kidney disease without heart failure, with stage 1 through stage 4 chronic kidney disease, or unspecified chronic kidney disease: Secondary | ICD-10-CM | POA: Insufficient documentation

## 2020-12-03 DIAGNOSIS — I83009 Varicose veins of unspecified lower extremity with ulcer of unspecified site: Secondary | ICD-10-CM | POA: Diagnosis present

## 2020-12-03 DIAGNOSIS — Z7984 Long term (current) use of oral hypoglycemic drugs: Secondary | ICD-10-CM | POA: Diagnosis not present

## 2020-12-03 DIAGNOSIS — E785 Hyperlipidemia, unspecified: Secondary | ICD-10-CM | POA: Diagnosis present

## 2020-12-03 DIAGNOSIS — N189 Chronic kidney disease, unspecified: Secondary | ICD-10-CM | POA: Diagnosis not present

## 2020-12-03 DIAGNOSIS — R799 Abnormal finding of blood chemistry, unspecified: Secondary | ICD-10-CM | POA: Diagnosis present

## 2020-12-03 DIAGNOSIS — Z87891 Personal history of nicotine dependence: Secondary | ICD-10-CM | POA: Diagnosis not present

## 2020-12-03 DIAGNOSIS — E875 Hyperkalemia: Secondary | ICD-10-CM | POA: Diagnosis not present

## 2020-12-03 DIAGNOSIS — I872 Venous insufficiency (chronic) (peripheral): Secondary | ICD-10-CM | POA: Diagnosis present

## 2020-12-03 DIAGNOSIS — Z452 Encounter for adjustment and management of vascular access device: Secondary | ICD-10-CM | POA: Insufficient documentation

## 2020-12-03 DIAGNOSIS — Z79899 Other long term (current) drug therapy: Secondary | ICD-10-CM | POA: Insufficient documentation

## 2020-12-03 DIAGNOSIS — E1122 Type 2 diabetes mellitus with diabetic chronic kidney disease: Secondary | ICD-10-CM | POA: Diagnosis not present

## 2020-12-03 DIAGNOSIS — Z20822 Contact with and (suspected) exposure to covid-19: Secondary | ICD-10-CM | POA: Insufficient documentation

## 2020-12-03 DIAGNOSIS — I10 Essential (primary) hypertension: Secondary | ICD-10-CM | POA: Diagnosis present

## 2020-12-03 DIAGNOSIS — R600 Localized edema: Secondary | ICD-10-CM | POA: Insufficient documentation

## 2020-12-03 DIAGNOSIS — I890 Lymphedema, not elsewhere classified: Secondary | ICD-10-CM

## 2020-12-03 DIAGNOSIS — N179 Acute kidney failure, unspecified: Secondary | ICD-10-CM | POA: Diagnosis not present

## 2020-12-03 DIAGNOSIS — E039 Hypothyroidism, unspecified: Secondary | ICD-10-CM | POA: Diagnosis not present

## 2020-12-03 DIAGNOSIS — F17200 Nicotine dependence, unspecified, uncomplicated: Secondary | ICD-10-CM | POA: Diagnosis present

## 2020-12-03 DIAGNOSIS — I119 Hypertensive heart disease without heart failure: Secondary | ICD-10-CM | POA: Diagnosis present

## 2020-12-03 LAB — CBC
HCT: 30 % — ABNORMAL LOW (ref 39.0–52.0)
Hemoglobin: 9.1 g/dL — ABNORMAL LOW (ref 13.0–17.0)
MCH: 27 pg (ref 26.0–34.0)
MCHC: 30.3 g/dL (ref 30.0–36.0)
MCV: 89 fL (ref 80.0–100.0)
Platelets: 186 10*3/uL (ref 150–400)
RBC: 3.37 MIL/uL — ABNORMAL LOW (ref 4.22–5.81)
RDW: 17.6 % — ABNORMAL HIGH (ref 11.5–15.5)
WBC: 5.8 10*3/uL (ref 4.0–10.5)
nRBC: 0 % (ref 0.0–0.2)

## 2020-12-03 LAB — TROPONIN I (HIGH SENSITIVITY)
Troponin I (High Sensitivity): 6 ng/L (ref <18)
Troponin I (High Sensitivity): 5 ng/L (ref <18)

## 2020-12-03 LAB — BASIC METABOLIC PANEL
Anion gap: 6 (ref 5–15)
Anion gap: 8 (ref 5–15)
BUN: 51 mg/dL — ABNORMAL HIGH (ref 8–23)
BUN: 48 mg/dL — ABNORMAL HIGH (ref 8–23)
CO2: 22 mmol/L (ref 22–32)
CO2: 21 mmol/L — ABNORMAL LOW (ref 22–32)
Calcium: 9.4 mg/dL (ref 8.9–10.3)
Calcium: 9.4 mg/dL (ref 8.9–10.3)
Chloride: 111 mmol/L (ref 98–111)
Chloride: 111 mmol/L (ref 98–111)
Creatinine, Ser: 1.99 mg/dL — ABNORMAL HIGH (ref 0.61–1.24)
Creatinine, Ser: 1.9 mg/dL — ABNORMAL HIGH (ref 0.61–1.24)
GFR, Estimated: 35 mL/min — ABNORMAL LOW (ref >60)
GFR, Estimated: 37 mL/min — ABNORMAL LOW (ref >60)
Glucose, Bld: 127 mg/dL — ABNORMAL HIGH (ref 70–99)
Glucose, Bld: 185 mg/dL — ABNORMAL HIGH (ref 70–99)
Potassium: 5.7 mmol/L — ABNORMAL HIGH (ref 3.5–5.1)
Potassium: 5.5 mmol/L — ABNORMAL HIGH (ref 3.5–5.1)
Sodium: 139 mmol/L (ref 135–145)
Sodium: 140 mmol/L (ref 135–145)

## 2020-12-03 LAB — RESP PANEL BY RT-PCR (FLU A&B, COVID) ARPGX2
Influenza A by PCR: NEGATIVE
Influenza B by PCR: NEGATIVE
SARS Coronavirus 2 by RT PCR: NEGATIVE

## 2020-12-03 LAB — PHOSPHORUS: Phosphorus: 3.8 mg/dL (ref 2.5–4.6)

## 2020-12-03 LAB — MAGNESIUM: Magnesium: 2 mg/dL (ref 1.7–2.4)

## 2020-12-03 LAB — CBG MONITORING, ED: Glucose-Capillary: 186 mg/dL — ABNORMAL HIGH (ref 70–99)

## 2020-12-03 LAB — CK: Total CK: 43 U/L — ABNORMAL LOW (ref 49–397)

## 2020-12-03 LAB — BRAIN NATRIURETIC PEPTIDE: B Natriuretic Peptide: 63.8 pg/mL (ref 0.0–100.0)

## 2020-12-03 MED ORDER — FUROSEMIDE 10 MG/ML IJ SOLN
20.0000 mg | Freq: Once | INTRAMUSCULAR | Status: AC
  Administered 2020-12-03: 20 mg via INTRAVENOUS
  Filled 2020-12-03: qty 4

## 2020-12-03 MED ORDER — ACETAMINOPHEN 650 MG RE SUPP
650.0000 mg | Freq: Four times a day (QID) | RECTAL | Status: DC | PRN
Start: 2020-12-03 — End: 2020-12-04

## 2020-12-03 MED ORDER — SODIUM CHLORIDE 0.9 % IV SOLN
2.0000 g | Freq: Two times a day (BID) | INTRAVENOUS | Status: DC
  Administered 2020-12-03 – 2020-12-04 (×2): 2 g via INTRAVENOUS
  Filled 2020-12-03 (×4): qty 2

## 2020-12-03 MED ORDER — SODIUM ZIRCONIUM CYCLOSILICATE 5 G PO PACK
5.0000 g | PACK | Freq: Once | ORAL | Status: AC
  Administered 2020-12-03: 5 g via ORAL
  Filled 2020-12-03: qty 1

## 2020-12-03 MED ORDER — VITAMIN D3 25 MCG (1000 UNIT) PO TABS
2000.0000 [IU] | ORAL_TABLET | Freq: Every day | ORAL | Status: DC
  Administered 2020-12-04: 2000 [IU] via ORAL
  Filled 2020-12-03 (×2): qty 2

## 2020-12-03 MED ORDER — INSULIN ASPART 100 UNIT/ML IJ SOLN
0.0000 [IU] | Freq: Three times a day (TID) | INTRAMUSCULAR | Status: DC
  Administered 2020-12-04: 1 [IU] via SUBCUTANEOUS
  Filled 2020-12-03: qty 1

## 2020-12-03 MED ORDER — INSULIN ASPART 100 UNIT/ML IJ SOLN: 0.0000 [IU] | Freq: Every day | INTRAMUSCULAR | Status: DC

## 2020-12-03 MED ORDER — PRAVASTATIN SODIUM 20 MG PO TABS
40.0000 mg | ORAL_TABLET | Freq: Every day | ORAL | Status: DC
  Administered 2020-12-03 – 2020-12-04 (×2): 40 mg via ORAL
  Filled 2020-12-03 (×2): qty 2

## 2020-12-03 MED ORDER — SERTRALINE HCL 50 MG PO TABS
100.0000 mg | ORAL_TABLET | Freq: Every day | ORAL | Status: DC
  Administered 2020-12-03 – 2020-12-04 (×2): 100 mg via ORAL
  Filled 2020-12-03 (×2): qty 2

## 2020-12-03 MED ORDER — SILVER SULFADIAZINE 1 % EX CREA
TOPICAL_CREAM | Freq: Two times a day (BID) | CUTANEOUS | Status: DC
  Filled 2020-12-03: qty 85

## 2020-12-03 MED ORDER — BISOPROLOL FUMARATE 5 MG PO TABS
5.0000 mg | ORAL_TABLET | Freq: Every day | ORAL | Status: DC
  Administered 2020-12-04: 10:00:00 5 mg via ORAL
  Filled 2020-12-03: qty 1

## 2020-12-03 MED ORDER — SODIUM CHLORIDE 0.9% FLUSH: 10.0000 mL | INTRAVENOUS | Status: DC | PRN

## 2020-12-03 MED ORDER — ACETAMINOPHEN 325 MG PO TABS: 650.0000 mg | ORAL_TABLET | Freq: Four times a day (QID) | ORAL | Status: DC | PRN

## 2020-12-03 MED ORDER — ONDANSETRON HCL 4 MG/2ML IJ SOLN: 4.0000 mg | Freq: Four times a day (QID) | INTRAMUSCULAR | Status: DC | PRN

## 2020-12-03 MED ORDER — HEPARIN SODIUM (PORCINE) 5000 UNIT/ML IJ SOLN
5000.0000 [IU] | Freq: Three times a day (TID) | INTRAMUSCULAR | Status: DC
  Administered 2020-12-03 – 2020-12-04 (×3): 5000 [IU] via SUBCUTANEOUS
  Filled 2020-12-03 (×3): qty 1

## 2020-12-03 MED ORDER — SODIUM ZIRCONIUM CYCLOSILICATE 5 G PO PACK
5.0000 g | PACK | Freq: Two times a day (BID) | ORAL | Status: AC
  Administered 2020-12-03 – 2020-12-04 (×2): 5 g via ORAL
  Filled 2020-12-03 (×2): qty 1

## 2020-12-03 MED ORDER — NYSTATIN 100000 UNIT/GM EX POWD
Freq: Three times a day (TID) | CUTANEOUS | Status: DC
  Filled 2020-12-03: qty 15

## 2020-12-03 MED ORDER — SODIUM CHLORIDE 0.9% FLUSH: 10.0000 mL | Freq: Two times a day (BID) | INTRAVENOUS | Status: DC

## 2020-12-03 MED ORDER — PRAVASTATIN SODIUM 20 MG PO TABS: 40.0000 mg | ORAL_TABLET | Freq: Every day | ORAL | Status: DC

## 2020-12-03 MED ORDER — SODIUM CHLORIDE 0.9 % IV SOLN
2.0000 g | Freq: Once | INTRAVENOUS | Status: AC
  Administered 2020-12-03: 2 g via INTRAVENOUS
  Filled 2020-12-03: qty 2

## 2020-12-03 MED ORDER — ONDANSETRON HCL 4 MG PO TABS: 4.0000 mg | ORAL_TABLET | Freq: Four times a day (QID) | ORAL | Status: DC | PRN

## 2020-12-03 MED ORDER — CHLORHEXIDINE GLUCONATE CLOTH 2 % EX PADS: 6.0000 | MEDICATED_PAD | Freq: Every day | CUTANEOUS | Status: DC

## 2020-12-03 NOTE — ED Provider Notes (Signed)
[facility] Emergency Department Provider Note   ____________________________________________   Event Date/Time   First MD Initiated Contact with Patient 12/03/20 1606     (approximate)  I have reviewed the triage vital signs and the nursing notes.   HISTORY  Chief Complaint Abnormal Lab    HPI [name] is a 70 y.o. male with below stated past medical history who presents for abnormal labs after he was called by his primary physician yesterday and told that his potassium and creatinine were elevated.  Patient states that he has been put on and taken off Lasix recently and was seen on 11/13/2020 where labs were drawn however the results were never conveyed to the patient.  Patient denies any complaints at this time other than chronic swelling to bilateral lower extremities with chronic wounds.  Of note, patient just had a PICC line placed today for IV antibiotics to treat these chronic wounds.  Patient currently denies any vision changes, tinnitus, difficulty speaking, facial droop, sore throat, chest pain, shortness of breath, abdominal pain, nausea/vomiting/diarrhea, dysuria, or weakness/numbness/paresthesias in any extremity          Past Medical History:  Diagnosis Date   Diabetes mellitus without complication ([place])    Hyperlipidemia    Hypertension    Leukemia ([place])     Patient Active Problem List   Diagnosis Date Noted   Edema 07/02/2020   Hypertensive heart disease without congestive heart failure 07/02/2020   Other personal history presenting hazards to health 07/02/2020   Morbid obesity ([place]) 07/02/2020   Peripheral vascular disease ([place]) 07/02/2020   Cataract 07/02/2020   Psychosexual dysfunction with inhibited sexual excitement 07/02/2020   Issue of medical certificate for disability examination 07/02/2020   Tobacco use disorder [id number]   Umbilical hernia 123XX123   Pain due to onychomycosis of toenails of both feet 01/23/2020    Venous ulcer ([place]) 04/11/2019   Chronic venous insufficiency 07/07/2016   Venous stasis dermatitis of both lower extremities 07/07/2016   Lymphedema 07/07/2016   Diabetes ([place]) 07/07/2016   Essential hypertension 07/07/2016   Hyperlipidemia 07/07/2016    Past Surgical History:  Procedure Laterality Date   CATARACT EXTRACTION Right    TONSILLECTOMY     TOOTH EXTRACTION      Prior to Admission medications   Medication Sig Start Date End Date Taking? Authorizing Provider  bisoprolol (ZEBETA) 5 MG tablet Take 1 tablet by mouth daily. 06/15/20   [provider]  Cholecalciferol 25 MCG (1000 UT) tablet Take 2 tablets by mouth daily. 09/11/14   [provider]  hydrALAZINE (APRESOLINE) 10 MG tablet Take 10 mg by mouth 3 (three) times daily.    [provider]  levofloxacin (LEVAQUIN) 750 MG tablet Take 1 tablet (750 mg total) by mouth daily. 11/05/20   Kris Hartmann, NP  lisinopril (ZESTRIL) 40 MG tablet TAKE ONE TABLET BY MOUTH EVERY DAY FOR BLOOD PRESSURE **REPLACES LISINOPRIL/HCTZ 04/13/20   [provider]  metFORMIN (GLUCOPHAGE) 1000 MG tablet Take 1,000 mg by mouth 2 (two) times daily with a meal.    [provider]  nystatin (MYCOSTATIN/NYSTOP) powder Apply to toe crevices with unna wrap changes 07/31/20   Kris Hartmann, NP  pioglitazone (ACTOS) 15 MG tablet Take 15 mg by mouth daily.    [provider]  pravastatin (PRAVACHOL) 80 MG tablet Take 40 mg by mouth daily. daily    [provider]  sertraline (ZOLOFT) 100 MG tablet Take 100 mg by  mouth daily.     [provider]  silver sulfADIAZINE (SILVADENE) 1 % cream Apply to left ankle with unna wrap changes 07/31/20   Kris Hartmann, NP    Allergies Patient has no known allergies.  Family History  Problem Relation Age of Onset   Cancer Mother    Stroke Father    Cancer Sister     Social History Social History   Tobacco Use   Smoking status: Former    Smokeless tobacco: Never  Substance Use Topics   Alcohol use: No   Drug use: No    Review of Systems Constitutional: No fever/chills Eyes: No visual changes. ENT: No sore throat. Cardiovascular: Denies chest pain. Respiratory: Denies shortness of breath. Gastrointestinal: No abdominal pain.  No nausea, no vomiting.  No diarrhea. Genitourinary: Negative for dysuria. Musculoskeletal: Endorses bilateral lower extremity pain due to chronic wounds Skin: Negative for rash. Neurological: Negative for headaches, weakness/numbness/paresthesias in any extremity Psychiatric: Negative for suicidal ideation/homicidal ideation   ____________________________________________   PHYSICAL EXAM:  VITAL SIGNS: ED Triage Vitals  Enc Vitals Group     BP 12/03/20 1340 124/72     Pulse Rate 12/03/20 1340 96     Resp 12/03/20 1340 16     Temp 12/03/20 1340 98.4 F (36.9 C)     Temp Source 12/03/20 1340 Oral     SpO2 12/03/20 1340 96 %     Weight 12/03/20 1341 (!) 308 lb 10.3 oz (140 kg)     Height 12/03/20 1341 '6\' 1"'$  (1.854 m)     Head Circumference --      Peak Flow --      Pain Score 12/03/20 1341 0     Pain Loc --      Pain Edu? --      Excl. in [place]? --    Constitutional: Alert and oriented. Well appearing obese elderly Caucasian male in no acute distress. Eyes: Conjunctivae are normal. PERRL. Head: Atraumatic. Nose: No congestion/rhinnorhea. Mouth/Throat: Mucous membranes are moist. Neck: No stridor Cardiovascular: Grossly normal heart sounds.  Good peripheral circulation. Respiratory: Normal respiratory effort.  No retractions. Gastrointestinal: Soft and nontender. No distention. Musculoskeletal: No obvious deformities Neurologic:  Normal speech and language. No gross focal neurologic deficits are appreciated. Skin:  Skin is warm and dry.  Significant edema to bilateral lower extremities with Ace wrap in place by wound care Psychiatric: Mood and affect are normal. Speech and  behavior are normal.  ____________________________________________   LABS (all labs ordered are listed, but only abnormal results are displayed)  Labs Reviewed  BASIC METABOLIC PANEL - Abnormal; Notable for the following components:      Result Value   Potassium 5.7 (*)    Glucose, Bld 127 (*)    BUN 51 (*)    Creatinine, Ser 1.99 (*)    GFR, Estimated 35 (*)    All other components within normal limits  CBC - Abnormal; Notable for the following components:   RBC 3.37 (*)    Hemoglobin 9.1 (*)    HCT 30.0 (*)    RDW 17.6 (*)    All other components within normal limits  RESP PANEL BY RT-PCR (FLU A&B, COVID) ARPGX2  TROPONIN I (HIGH SENSITIVITY)  TROPONIN I (HIGH SENSITIVITY)   ____________________________________________  EKG  ED ECG REPORT I, Naaman Plummer, the attending physician, personally viewed and interpreted this ECG.  Date: 12/03/2020 EKG Time: 1349 Rate: 64 Rhythm: normal sinus rhythm QRS Axis: normal Intervals: normal  ST/T Wave abnormalities: normal Narrative Interpretation: no evidence of acute ischemia  PROCEDURES  Procedure(s) performed (including Critical Care):  .1-3 Lead EKG Interpretation  Date/Time: 12/03/2020 4:39 PM Performed by: Naaman Plummer, MD Authorized by: Naaman Plummer, MD     Interpretation: normal     ECG rate:  84   ECG rate assessment: normal     Rhythm: sinus rhythm     Ectopy: none     Conduction: normal     ____________________________________________   INITIAL IMPRESSION / ASSESSMENT AND PLAN / ED COURSE  As part of my medical decision making, I reviewed the following data within the electronic medical record, if available:  Nursing notes reviewed and incorporated, Labs reviewed, EKG interpreted, Old chart reviewed, Radiograph reviewed and Notes from prior ED visits reviewed and incorporated      This patient presents with elevated potassium and creatinine. Suspect acute kidney injury of prerenal origin.  Doubt intrinsic renal dysfunction or obstructive nephropathy. Considered alternate etiologies of the patients symptoms including infectious processes, severe metabolic derangements or electrolyte abnormalities, ischemia/ACS, heart failure, and intracranial/central processes but think these are unlikely given the history and physical exam.  Plan: labs, 528m IV fluid resuscitation, pain/nausea control, reassessment  Dispo: Admit to medicine    ____________________________________________ FINAL CLINICAL IMPRESSION(S) / ED DIAGNOSES  Final diagnoses:  Hyperkalemia  AKI (acute kidney injury) ([facility]     ED Discharge Orders     None        Note:  This document was prepared using Dragon voice recognition software and may include unintentional dictation errors.    BNaaman Plummer MD 12/03/20 [phone]

## 2020-12-03 NOTE — Plan of Care (Signed)
Dr. Geraldine Solar to wait on 1830 BNP draw to decide if admitting of discharge.  Report given to floor, just in case and will keep informed.

## 2020-12-03 NOTE — Progress Notes (Signed)
Peripherally Inserted Central Catheter Placement  The IV Nurse has discussed with the patient and/or persons authorized to consent for the patient, the purpose of this procedure and the potential benefits and risks involved with this procedure.  The benefits include less needle sticks, lab draws from the catheter, and the patient may be discharged home with the catheter. Risks include, but not limited to, infection, bleeding, blood clot (thrombus formation), and puncture of an artery; nerve damage and irregular heartbeat and possibility to perform a PICC exchange if needed/ordered by physician.  Alternatives to this procedure were also discussed.  [employer] PICC patient education guide, fact sheet on infection prevention and patient information card has been provided to patient /or left at bedside.    PICC Placement Documentation  PICC Single Lumen 99991111 Right Basilic 44 cm 0 cm (Active)  Indication for Insertion or Continuance of Line Home intravenous therapies (PICC only) 12/03/20 1158  Exposed Catheter (cm) 0 cm 12/03/20 1158  Site Assessment Clean;Dry;Intact 12/03/20 1158  Line Status Flushed;Saline locked;Blood return noted 12/03/20 1158  Dressing Type Transparent 12/03/20 1158  Dressing Status Clean;Dry;Intact 12/03/20 1158  Antimicrobial disc in place? Yes 12/03/20 1158  Safety Lock Not Applicable [id number]  Line Care Connections checked and tightened 12/03/20 1158  Line Adjustment (NICU/IV Team Only) No 12/03/20 1158  Dressing Intervention New dressing 12/03/20 1158  Dressing Change Due 12/10/20 12/03/20 [name]       [name] 12/03/2020, 11:59 AM

## 2020-12-03 NOTE — H&P (Addendum)
History and Physical   [name] [id number] DOB: [birth year] DOA: 12/03/2020  [facility]P: [facility] [place]  Outpatient Specialists: Dr. Rockey Situ Patient coming from: vascular office  I have personally briefly reviewed patient's old medical records in [name].  Chief Concern: Abnormal labs  HPI: [name] is a 70 y.o. male with medical history significant for hypertension, bilateral lower extremity lymphedema, hyperlipidemia, depression, non-insulin-dependent diabetes mellitus, chronic venous ulcer, hypothyroid, presents to the emergency department for chief concerns of abnormal labs via his PCP/[facility] clinic.  At bedside he is able to tell me his full name, his age, his current calendar year, and current location of hospital.  He was in his normal state of health and finishing the procedure to get the PICC line placed for his chronic lower extremity wound when the vascular staff received a phone call from his PCPs office advising patient to present to the emergency department.  He was advised to present to the emergency department due to abnormal labs including a serum creatinine of 2.0, and potassium of 5.9.  At bedside he denies chest pain, shortness of breath, abdominal pain, dysuria, diarrhea, changes to his ambulation, fever, chills, cough, nausea, vomiting.  Social history: He lives at home by himself.  Formally patient took care of his father who is 57 years old.  His father recently went to a nursing facility.  His cousin [name] lives across the street from him and is his primary caregiver.  He denies tobacco, EtOH, recreational drug use.  Vaccination history: He is vaccinated for COVID-19 with 2 doses and 1 booster from [place]: Constitutional: no weight change, no fever ENT/Mouth: no sore throat, no rhinorrhea Eyes: no eye pain, no vision changes Cardiovascular: no chest pain, no dyspnea,  no edema, no palpitations Respiratory: no cough, no sputum, no  wheezing Gastrointestinal: no nausea, no vomiting, no diarrhea, no constipation Genitourinary: no urinary incontinence, no dysuria, no hematuria Musculoskeletal: no arthralgias, no myalgias Skin: no skin lesions, no pruritus, Neuro: no weakness, no loss of consciousness, no syncope Psych: no anxiety, no depression, no decrease appetite Heme/Lymph: no bruising, no bleeding  ED Course: Discussed with emergency medicine provider, patient requiring hospitalization due to acute kidney injury and hyperkalemia.   Vitals in the emergency department was remarkable for temperature 98.4, respiration rate of 16, heart rate 96, blood pressure 124/72, SPO2 of 96% on room air  Labs in the emergency department was remarkable for sodium 139, potassium 5.7, chloride 111, bicarb 22, BUN 51, serum creatinine of 1.99, nonfasting blood glucose 127, WBC 5.8, hemoglobin 9.1, platelets 186.  eGFR 35.  Troponin was 6.  Repeat troponin was 5.  ED provider ordered 1 dose of Lokelma.  Assessment/Plan  Principal Problem:   Hyperkalemia Active Problems:   Chronic venous insufficiency   Lymphedema   Essential hypertension   Hyperlipidemia   Venous ulcer (HCC)   Hypertensive heart disease without congestive heart failure   Morbid obesity (HCC)   Tobacco use disorder   AKI (acute kidney injury) ([name])   # Acute kidney injury # Hyperkalemia secondary to acute kidney injury-I suspect component of fluid retention with ACEi in setting of recent diuresis discontinuation on 11/13/2020 and component of CKD - Patient was prescribed by his PCP to take furosemide 20 mg every other day, however vascular prescribed patient to take every day. On PCP visit on 11/13/20, patient was noted to have AKI with sCr of 2.0 and K of 5.9. Patient was advised by PCP to  stop taking furosemide completely with repeat labs on 11/30/20. Which showed sCr of 2.0 and K of 5.9, at which point patient was advised to present to the ED for further evaluation.   - Status post Lokelma one-time dose per EDP - IV Lasix 20 mg once - We will check magnesium, phosphorus, BNP - Repeat BMP scheduled at 6:30 PM, showed K of 5.5 and serum Cr of 1.90, eGFR 37 - Additional IV Lasix 20 mg at 2300 - Lokelma, 5 mg twice daily, 2 more doses ordered - Recommend AM team to consider discharging patient on prior dosing of furosemide 20 mg every other day and follow-up with PCP in 1-2 weeks for lab recheck.   # Chronic venous ulcer failed multiple p.o. oral antibiotics - Had PICC line placed in the right upper extremity on day of admission on 12/03/2020 - IV cefepime 2 g every 12 hours at 8 AM and 8 PM has plan for initiation on 12/04/2020  # Hypertension-patient currently normotensive-resume bisoprolol 5 mg p.o. daily for 12/04/2020 - Holding home lisinopril at this time -Patient is on hydralazine 10 mg 3 times daily, as patient is currently normotensive and low normotensive I have not resumed this medication  # Hyperlipidemia-resumed pravastatin 40 mg nightly  # Depression-resumed sertraline 100 milligrams daily  # Non-insulin-dependent diabetes mellitus-metformin has not been resumed due to acute kidney injury - Actos is not a medication I resumed while inpatient - Insulin SSI with at bedtime coverage ordered  Chart reviewed.   DVT prophylaxis: Heparin 5000 units subcutaneous every 8 hours Code Status: Full code Diet: Heart healthy/carb modified Family Communication: Updated cousin, [name] at bedside Disposition Plan: Pending clinical course Consults called: None at this time Admission status: MedSurg, observation, telemetry ordered  Past Medical History:  Diagnosis Date   Diabetes mellitus without complication ([place])    Hyperlipidemia    Hypertension    Leukemia ([place])    Past Surgical History:  Procedure Laterality Date   CATARACT EXTRACTION Right    TONSILLECTOMY     TOOTH EXTRACTION     Social History:  reports that he has quit smoking. He has  never used smokeless tobacco. He reports that he does not drink alcohol and does not use drugs.  No Known Allergies Family History  Problem Relation Age of Onset   Cancer Mother    Stroke Father    Cancer Sister    Family history: Family history reviewed and not pertinent  Prior to Admission medications   Medication Sig Start Date End Date Taking? Authorizing Provider  bisoprolol (ZEBETA) 5 MG tablet Take 1 tablet by mouth daily. 06/15/20   [provider]  Cholecalciferol 25 MCG (1000 UT) tablet Take 2 tablets by mouth daily. 09/11/14   [provider]  hydrALAZINE (APRESOLINE) 10 MG tablet Take 10 mg by mouth 3 (three) times daily.    [provider]  levofloxacin (LEVAQUIN) 750 MG tablet Take 1 tablet (750 mg total) by mouth daily. 11/05/20   Kris Hartmann, NP  lisinopril (ZESTRIL) 40 MG tablet TAKE ONE TABLET BY MOUTH EVERY DAY FOR BLOOD PRESSURE **REPLACES LISINOPRIL/HCTZ 04/13/20   [provider]  metFORMIN (GLUCOPHAGE) 1000 MG tablet Take 1,000 mg by mouth 2 (two) times daily with a meal.    [provider]  nystatin (MYCOSTATIN/NYSTOP) powder Apply to toe crevices with unna wrap changes 07/31/20   Kris Hartmann, NP  pioglitazone (ACTOS) 15 MG tablet Take 15 mg by mouth daily.  [provider]  pravastatin (PRAVACHOL) 80 MG tablet Take 40 mg by mouth daily. daily    [provider]  sertraline (ZOLOFT) 100 MG tablet Take 100 mg by mouth daily.     [provider]  silver sulfADIAZINE (SILVADENE) 1 % cream Apply to left ankle with unna wrap changes 07/31/20   Kris Hartmann, NP   Physical Exam: Vitals:   12/03/20 1340 12/03/20 1341  BP: 124/72   Pulse: 96   Resp: 16   Temp: 98.4 F (36.9 C)   TempSrc: Oral   SpO2: 96%   Weight:  (!) 140 kg  Height:  _0  (1.854 m)   Constitutional: appears age-appropriate, NAD, calm, comfortable Eyes: PERRL, lids and conjunctivae normal ENMT: Mucous membranes are  moist. Posterior pharynx clear of any exudate or lesions. Age-appropriate dentition. Hearing appropriate Neck: normal, supple, no masses, no thyromegaly Respiratory: clear to auscultation bilaterally, no wheezing, no crackles. Normal respiratory effort. No accessory muscle use.  Cardiovascular: Regular rate and rhythm, no murmurs / rubs / gallops. Bilateral lower extremity edema. 2+ pedal pulses. No carotid bruits.  Abdomen: obese abdomen, no tenderness, no masses palpated, no hepatosplenomegaly. Bowel sounds positive.  Musculoskeletal: no clubbing / cyanosis. No joint deformity upper and lower extremities. Good ROM, no contractures, no atrophy. Normal muscle tone.  Skin: no rashes, lesions, ulcers. No induration Neurologic: Sensation intact. Strength 5/5 in all 4.  Psychiatric: Normal judgment and insight. Alert and oriented x 3. Normal mood.   EKG: independently reviewed, showing sinus rhythm with rate of 64, Qtc 404  Chest x-ray on Admission: Not indicated  Labs on Admission: I have personally reviewed following labs  CBC: Recent Labs  Lab 12/03/20 1344  WBC 5.8  HGB 9.1*  HCT 30.0*  MCV 89.0  PLT 761   Basic Metabolic Panel: Recent Labs  Lab 12/03/20 1344  NA 139  K 5.7*  CL 111  CO2 22  GLUCOSE 127*  BUN 51*  CREATININE 1.99*  CALCIUM 9.4   GFR: Estimated Creatinine Clearance: 50.8 mL/min (A) (by C-G formula based on SCr of 1.99 mg/dL (H)).  Dr. Tobie Poet [facility]  If 7PM-7AM, please contact overnight-coverage provider If 7AM-7PM, please contact day coverage provider [email or web address]  12/03/2020, 6:54 PM

## 2020-12-03 NOTE — Progress Notes (Signed)
History of Present Illness  There is no documented history at this time  Assessments & Plan   There are no diagnoses linked to this encounter.    Additional instructions  Subjective:  Patient presents with venous ulcer of the Bilateral lower extremity.    Procedure:  3 layer unna wrap was placed Bilateral lower extremity.   Plan:   Follow up in one week.  

## 2020-12-03 NOTE — ED Triage Notes (Signed)
Pt presents to the [facility] with c/o abnormal lab tests. Pt states that he had labs drawn yesterday by PCP who called last night to tell patient that his creatinine and potassium levels were elevated. Pt has no complaints at this time.

## 2020-12-04 DIAGNOSIS — E875 Hyperkalemia: Secondary | ICD-10-CM

## 2020-12-04 LAB — CBC
HCT: 28.6 % — ABNORMAL LOW (ref 39.0–52.0)
Hemoglobin: 8.7 g/dL — ABNORMAL LOW (ref 13.0–17.0)
MCH: 27.2 pg (ref 26.0–34.0)
MCHC: 30.4 g/dL (ref 30.0–36.0)
MCV: 89.4 fL (ref 80.0–100.0)
Platelets: 193 10*3/uL (ref 150–400)
RBC: 3.2 MIL/uL — ABNORMAL LOW (ref 4.22–5.81)
RDW: 17.4 % — ABNORMAL HIGH (ref 11.5–15.5)
WBC: 7 10*3/uL (ref 4.0–10.5)
nRBC: 0 % (ref 0.0–0.2)

## 2020-12-04 LAB — POTASSIUM: Potassium: 5 mmol/L (ref 3.5–5.1)

## 2020-12-04 LAB — HIV ANTIBODY (ROUTINE TESTING W REFLEX): HIV Screen 4th Generation wRfx: NONREACTIVE

## 2020-12-04 LAB — BASIC METABOLIC PANEL
Anion gap: 7 (ref 5–15)
BUN: 46 mg/dL — ABNORMAL HIGH (ref 8–23)
CO2: 22 mmol/L (ref 22–32)
Calcium: 9.1 mg/dL (ref 8.9–10.3)
Chloride: 109 mmol/L (ref 98–111)
Creatinine, Ser: 1.78 mg/dL — ABNORMAL HIGH (ref 0.61–1.24)
GFR, Estimated: 41 mL/min — ABNORMAL LOW (ref >60)
Glucose, Bld: 155 mg/dL — ABNORMAL HIGH (ref 70–99)
Potassium: 4.7 mmol/L (ref 3.5–5.1)
Sodium: 138 mmol/L (ref 135–145)

## 2020-12-04 LAB — GLUCOSE, CAPILLARY
Glucose-Capillary: 158 mg/dL — ABNORMAL HIGH (ref 70–99)
Glucose-Capillary: 159 mg/dL — ABNORMAL HIGH (ref 70–99)

## 2020-12-04 LAB — OSMOLALITY: Osmolality: 314 mOsm/kg — ABNORMAL HIGH (ref 275–295)

## 2020-12-04 MED ORDER — ADULT MULTIVITAMIN W/MINERALS CH
1.0000 | ORAL_TABLET | Freq: Every day | ORAL | Status: DC
  Administered 2020-12-04: 17:00:00 1 via ORAL
  Filled 2020-12-04: qty 1

## 2020-12-04 MED ORDER — ENSURE MAX PROTEIN PO LIQD
11.0000 [oz_av] | Freq: Two times a day (BID) | ORAL | Status: DC
  Administered 2020-12-04: 11 [oz_av] via ORAL
  Filled 2020-12-04: qty 330

## 2020-12-04 MED ORDER — ENSURE MAX PROTEIN PO LIQD: 11.0000 [oz_av] | Freq: Two times a day (BID) | ORAL | 12 refills | Status: DC

## 2020-12-04 MED ORDER — ADULT MULTIVITAMIN W/MINERALS CH: 1.0000 | ORAL_TABLET | Freq: Every day | ORAL | 1 refills | Status: AC

## 2020-12-04 MED ORDER — CHLORHEXIDINE GLUCONATE CLOTH 2 % EX PADS
6.0000 | MEDICATED_PAD | Freq: Every day | CUTANEOUS | Status: DC
  Administered 2020-12-04: 10:00:00 6 via TOPICAL

## 2020-12-04 MED ORDER — FUROSEMIDE 20 MG PO TABS: 20.0000 mg | ORAL_TABLET | Freq: Every day | ORAL | 11 refills | Status: DC

## 2020-12-04 NOTE — Consult Note (Addendum)
[facility] Nurse Consult Note: Reason for Consult: Consult requested for bilat legs.  Pt states he is followed as an outpatient by the vascular team for bilat chronic venous stasis ulcers. He had an appointment with their team yesterday and they assessed his wounds and legs and applied bilat Una boots.  These are ordered to be changed Q Thurs. He states he has chronic edema and large amt weeping yellow drainage.  Because wounds were just assessed yesterday and Una boots were applied at that time, I will not remove and change again today.  He can resume follow-up with their team after discharge. Topical treatment orders provided as follows: Leave bilat Una boots in place, WOC will change Q Thurs while patient is in the hospital. Thank-you,  Julien Girt MSN, RN, [place], [place], [place]

## 2020-12-04 NOTE — TOC Progression Note (Addendum)
Transition of Care [facility]) - Progression Note    Patient Details  Name: [name] MRN: [id number] Date of Birth: July 06, 1950  Transition of Care [facility]) CM/SW [place], RN Phone Number: 12/04/2020, 3:28 PM  Clinical Narrative:   Note:  Carolynn Sayers is with [facility] infusion, addendum change made to this note at 16:16   Patient is known to [facility] infusion therapy and Carolynn Sayers for outpatient infusions.  Pam obtained approval for patient to receive infusions at home, by [facility] and to have [facility] home health.  This authorization was from the [place] and it is dated with today's date, received at 3pm.    Patient's caregiver is at bedside, and as reported by Ms. Tamera Punt, the caregiver has been trained to do home infusions by [employer].  Caregiver can administer infusions tonight, and [facility] and [facility] will visit patient tomorrow at his home.   Patient will be discharged today, [facility] and [facility] aware of discharge.        Expected Discharge Plan and Services                                                 Social Determinants of Health (SDOH) Interventions    Readmission Risk Interventions No flowsheet data found.

## 2020-12-04 NOTE — Discharge Summary (Signed)
Physician Discharge Summary  [name] [id number] DOB: 08-23-1950 DOA: 12/03/2020  PCP: [name]  Admit date: 12/03/2020 Discharge date: 12/04/2020  Admitted From: Home Disposition: Home  Recommendations for Outpatient Follow-up:  Follow up with PCP in 1-2 weeks Please obtain BMP/CBC in one week Please follow up on the following pending results: None  Home Health: Yes Equipment/Devices: Discharge Condition: Stable CODE STATUS: Full Diet recommendation: Heart Healthy / Carb Modified   Brief/Interim Summary: [name] is a 70 y.o. male with medical history significant for hypertension, bilateral lower extremity lymphedema, hyperlipidemia, depression, non-insulin-dependent diabetes mellitus, chronic venous ulcer, hypothyroid, presents to the emergency department for chief concerns of abnormal labs via [facility]. Patient got his PICC line placed yesterday for his chronic lower extremity wounds, vascular staff received a phone call from PCP office advising patient to go to emergency room for a potassium of 5.9 and creatinine of 2.  On recheck potassium was 5.7 and creatinine was 1.9.  Patient received 2 doses of Lokelma and his potassium normalized this morning.  Patient was also on lisinopril and his home dose of Lasix was recently discontinued.  Patient was advised to hold lisinopril for few days and restart his home dose of Lasix and follow-up with his PCP for further recommendations.  Patient is being discharged home and he will resume his antibiotics infusions with cefepime as planned and follow-up with his PCP for further recommendations.  Discharge Diagnoses:  Principal Problem:   Hyperkalemia Active Problems:   Chronic venous insufficiency   Lymphedema   Essential hypertension   Hyperlipidemia   Venous ulcer (HCC)   Hypertensive heart disease without congestive heart failure   Morbid obesity ([place])   Tobacco use disorder   AKI (acute kidney  injury) [facility])   Discharge Instructions  Discharge Instructions     Diet - low sodium heart healthy   Complete by: As directed    Discharge instructions   Complete by: As directed    It was pleasure taking care of you. Continue holding lisinopril for next couple of days and then restart after seeing your primary care provider. I am starting you on a low-dose Lasix/water pill which will help maintaining your potassium levels, continue taking rest of your medications and follow-up closely with your primary care provider for further recommendations.   Increase activity slowly   Complete by: As directed    No dressing needed   Complete by: As directed       Allergies as of 12/04/2020   No Known Allergies      Medication List     STOP taking these medications    levofloxacin 750 MG tablet Commonly known as: Levaquin       TAKE these medications    bisoprolol 5 MG tablet Commonly known as: ZEBETA Take 1 tablet by mouth daily.   Cholecalciferol 25 MCG (1000 UT) tablet Take 2 tablets by mouth daily.   Ensure Max Protein Liqd Take 330 mLs (11 oz total) by mouth 2 (two) times daily.   furosemide 20 MG tablet Commonly known as: Lasix Take 1 tablet (20 mg total) by mouth daily.   hydrALAZINE 10 MG tablet Commonly known as: APRESOLINE Take 10 mg by mouth 3 (three) times daily.   lisinopril 40 MG tablet Commonly known as: ZESTRIL TAKE ONE TABLET BY MOUTH EVERY DAY FOR BLOOD PRESSURE **REPLACES LISINOPRIL/HCTZ   metFORMIN 1000 MG tablet Commonly known as: GLUCOPHAGE Take 1,000 mg by mouth 2 (two) times daily  with a meal.   multivitamin with minerals Tabs tablet Take 1 tablet by mouth daily.   nystatin powder Commonly known as: MYCOSTATIN/NYSTOP Apply to toe crevices with unna wrap changes   pioglitazone 15 MG tablet Commonly known as: ACTOS Take 15 mg by mouth daily.   pravastatin 80 MG tablet Commonly known as: PRAVACHOL Take 40 mg by mouth daily. daily    sertraline 100 MG tablet Commonly known as: ZOLOFT Take 100 mg by mouth daily.   silver sulfADIAZINE 1 % cream Commonly known as: SILVADENE Apply to left ankle with unna wrap changes               Discharge Care Instructions  (From admission, onward)           Start     Ordered   12/04/20 0000  No dressing needed        12/04/20 1623            Follow-up [place]. Schedule an appointment as soon as possible for a visit in 1 week(s).   Specialty: General Practice Contact information: [place] [phone]                No Known Allergies  Consultations: None  Procedures/Studies: [place] EKG SITE RITE  Result Date: 12/03/2020 If Site Rite image not attached, placement could not be confirmed due to current cardiac rhythm.   Subjective: Patient was seen and examined today.  No complaints.  Cousin at bedside.  Discharge Exam: Vitals:   12/04/20 1100 12/04/20 1528  BP: 131/71 124/66  Pulse: (!) 57 (!) 57  Resp: 18 16  Temp: 97.8 F (36.6 C) 97.8 F (36.6 C)  SpO2: 98% 100%   Vitals:   12/04/20 0431 12/04/20 0741 12/04/20 1100 12/04/20 1528  BP: 130/67 139/71 131/71 124/66  Pulse: 68 61 (!) 57 (!) 57  Resp: '16 20 18 16  '$ Temp: (!) 97.4 F (36.3 C) 97.8 F (36.6 C) 97.8 F (36.6 C) 97.8 F (36.6 C)  TempSrc:      SpO2: 100% 98% 98% 100%  Weight:      Height:        General: Pt is alert, awake, not in acute distress Cardiovascular: RRR, S1/S2 +, no rubs, no gallops Respiratory: CTA bilaterally, no wheezing, no rhonchi Abdominal: Soft, NT, ND, bowel sounds + Extremities: Lower extremities with Unna boot   The results of significant diagnostics from this hospitalization (including imaging, microbiology, ancillary and laboratory) are listed below for reference.    Microbiology: Recent Results (from the past 240 hour(s))  Resp Panel by RT-PCR (Flu A&B, Covid) Nasopharyngeal Swab      Status: None   Collection Time: 12/03/20  4:39 PM   Specimen: Nasopharyngeal Swab; Nasopharyngeal(NP) swabs in vial transport medium  Result Value Ref Range Status   SARS Coronavirus 2 by RT PCR NEGATIVE NEGATIVE Final    Comment: (NOTE) SARS-CoV-2 target nucleic acids are NOT DETECTED.  The SARS-CoV-2 RNA is generally detectable in upper respiratory specimens during the acute phase of infection. The lowest concentration of SARS-CoV-2 viral copies this assay can detect is 138 copies/mL. A negative result does not preclude SARS-Cov-2 infection and should not be used as the sole basis for treatment or other patient management decisions. A negative result may occur with  improper specimen collection/handling, submission of specimen other than nasopharyngeal swab, presence of viral mutation(s) within the areas targeted by this assay,  and inadequate number of viral copies(<138 copies/mL). A negative result must be combined with clinical observations, patient history, and epidemiological information. The expected result is Negative.  Fact Sheet for Patients:  EntrepreneurPulse.com.au  Fact Sheet for Healthcare Providers:  IncredibleEmployment.be  This test is no t yet approved or cleared by the [place] FDA and  has been authorized for detection and/or diagnosis of SARS-CoV-2 by FDA under an Emergency Use Authorization (EUA). This EUA will remain  in effect (meaning this test can be used) for the duration of the COVID-19 declaration under Section 564(b)(1) of the Act, 21 U.S.C.section 360bbb-3(b)(1), unless the authorization is terminated  or revoked sooner.       Influenza A by PCR NEGATIVE NEGATIVE Final   Influenza B by PCR NEGATIVE NEGATIVE Final    Comment: (NOTE) The Xpert Xpress SARS-CoV-2/FLU/RSV plus assay is intended as an aid in the diagnosis of influenza from Nasopharyngeal swab specimens and should not be used as a sole basis  for treatment. Nasal washings and aspirates are unacceptable for Xpert Xpress SARS-CoV-2/FLU/RSV testing.  Fact Sheet for Patients: EntrepreneurPulse.com.au  Fact Sheet for Healthcare Providers: IncredibleEmployment.be  This test is not yet approved or cleared by the [place] FDA and has been authorized for detection and/or diagnosis of SARS-CoV-2 by FDA under an Emergency Use Authorization (EUA). This EUA will remain in effect (meaning this test can be used) for the duration of the COVID-19 declaration under Section 564(b)(1) of the Act, 21 U.S.C. section 360bbb-3(b)(1), unless the authorization is terminated or revoked.  Performed at [facility], [address] [place], [place] [address]      Labs: BNP (last 3 results) Recent Labs    12/03/20 1824  BNP [id number]   Basic Metabolic Panel: Recent Labs  Lab 12/03/20 1344 12/03/20 1824 12/03/20 2355 12/04/20 0452  NA 139 140  --  138  K 5.7* 5.5* 5.0 4.7  CL 111 111  --  109  CO2 22 21*  --  22  GLUCOSE 127* 185*  --  155*  BUN 51* 48*  --  46*  CREATININE 1.99* 1.90*  --  1.78*  CALCIUM 9.4 9.4  --  9.1  MG  --  2.0  --   --   PHOS  --  3.8  --   --    Liver Function Tests: No results for input(s): AST, ALT, ALKPHOS, BILITOT, PROT, ALBUMIN in the last 168 hours. No results for input(s): LIPASE, AMYLASE in the last 168 hours. No results for input(s): AMMONIA in the last 168 hours. CBC: Recent Labs  Lab 12/03/20 1344 12/04/20 0452  WBC 5.8 7.0  HGB 9.1* 8.7*  HCT 30.0* 28.6*  MCV 89.0 89.4  PLT 186 193   Cardiac Enzymes: Recent Labs  Lab 12/03/20 1824  CKTOTAL 43*   BNP: Invalid input(s): POCBNP CBG: Recent Labs  Lab 12/03/20 2259 12/04/20 0740 12/04/20 1146  GLUCAP 186* 158* 159*   D-Dimer No results for input(s): DDIMER in the last 72 hours. Hgb A1c No results for input(s): HGBA1C in the last 72 hours. Lipid Profile No results for  input(s): CHOL, HDL, LDLCALC, TRIG, CHOLHDL, LDLDIRECT in the last 72 hours. Thyroid function studies No results for input(s): TSH, T4TOTAL, T3FREE, THYROIDAB in the last 72 hours.  Invalid input(s): FREET3 Anemia work up No results for input(s): VITAMINB12, FOLATE, FERRITIN, TIBC, IRON, RETICCTPCT in the last 72 hours. Urinalysis No results found for: COLORURINE, APPEARANCEUR, [place], [place], [place], [place], [place], [place], [place],  UROBILINOGEN, NITRITE, LEUKOCYTESUR Sepsis Labs Invalid input(s): PROCALCITONIN,  WBC,  LACTICIDVEN Microbiology Recent Results (from the past 240 hour(s))  Resp Panel by RT-PCR (Flu A&B, Covid) Nasopharyngeal Swab     Status: None   Collection Time: 12/03/20  4:39 PM   Specimen: Nasopharyngeal Swab; Nasopharyngeal(NP) swabs in vial transport medium  Result Value Ref Range Status   SARS Coronavirus 2 by RT PCR NEGATIVE NEGATIVE Final    Comment: (NOTE) SARS-CoV-2 target nucleic acids are NOT DETECTED.  The SARS-CoV-2 RNA is generally detectable in upper respiratory specimens during the acute phase of infection. The lowest concentration of SARS-CoV-2 viral copies this assay can detect is 138 copies/mL. A negative result does not preclude SARS-Cov-2 infection and should not be used as the sole basis for treatment or other patient management decisions. A negative result may occur with  improper specimen collection/handling, submission of specimen other than nasopharyngeal swab, presence of viral mutation(s) within the areas targeted by this assay, and inadequate number of viral copies(<138 copies/mL). A negative result must be combined with clinical observations, patient history, and epidemiological information. The expected result is Negative.  Fact Sheet for Patients:  EntrepreneurPulse.com.au  Fact Sheet for Healthcare Providers:  IncredibleEmployment.be  This test is no t yet approved or cleared by  the [place] FDA and  has been authorized for detection and/or diagnosis of SARS-CoV-2 by FDA under an Emergency Use Authorization (EUA). This EUA will remain  in effect (meaning this test can be used) for the duration of the COVID-19 declaration under Section 564(b)(1) of the Act, 21 U.S.C.section 360bbb-3(b)(1), unless the authorization is terminated  or revoked sooner.       Influenza A by PCR NEGATIVE NEGATIVE Final   Influenza B by PCR NEGATIVE NEGATIVE Final    Comment: (NOTE) The Xpert Xpress SARS-CoV-2/FLU/RSV plus assay is intended as an aid in the diagnosis of influenza from Nasopharyngeal swab specimens and should not be used as a sole basis for treatment. Nasal washings and aspirates are unacceptable for Xpert Xpress SARS-CoV-2/FLU/RSV testing.  Fact Sheet for Patients: EntrepreneurPulse.com.au  Fact Sheet for Healthcare Providers: IncredibleEmployment.be  This test is not yet approved or cleared by the [place] FDA and has been authorized for detection and/or diagnosis of SARS-CoV-2 by FDA under an Emergency Use Authorization (EUA). This EUA will remain in effect (meaning this test can be used) for the duration of the COVID-19 declaration under Section 564(b)(1) of the Act, 21 U.S.C. section 360bbb-3(b)(1), unless the authorization is terminated or revoked.  Performed at [facility], [address] [place], [place] [address]     Time coordinating discharge: Over 30 minutes  SIGNED:  Lorella Nimrod, MD  [facility] 12/04/2020, 4:26 PM  If 7PM-7AM, please contact night-coverage [email or web address]  This record has been created using Systems analyst. Errors have been sought and corrected,but may not always be located. Such creation errors do not reflect on the standard of care.

## 2020-12-04 NOTE — Progress Notes (Signed)
Initial Nutrition Assessment  DOCUMENTATION CODES:  Morbid obesity  INTERVENTION:  Continue current diet as ordered, encourage PO intake Ensure Max po BID, each supplement provides 150 kcal and 30 grams of protein.  Multivitamin with minerals daily  NUTRITION DIAGNOSIS:  Increased nutrient needs related to wound healing as evidenced by estimated needs.  GOAL:  Patient will meet greater than or equal to 90% of their needs  MONITOR:  PO intake, Supplement acceptance, Skin  REASON FOR ASSESSMENT:  Malnutrition Screening Tool    ASSESSMENT:  Pt with PMH of DM, HLD, HTN, and PVD sent to ED after PCP informed him of abnormal labs (elevated K and creatinine). Hx of chronic wounds to the BLE.   Pt resting in bed at the time of visit, family at bedside. Reports appetite has not been good at home for some time. Does report he ate well at lunch today. At home, usually eats a small breakfast (usually cereal), might have a snack around lunch time (popcorn), and a sandwich for dinner. Mainly drinks water at home. Discussed low protein being consumed during the day and the need for additional in diet to assist with wound healing. Discussed snacks that could be utilized. Pt agreeable to receiving ensure max while admitted to assist with meeting protein needs.   Discussed weight loss, reports that his dieretics were increased recently and he lost 7 lbs in 1 week. Like the result of fluid loss rather than body mass.   Average Meal Intake: 7/28-7/29: 95% intake x 1 recorded meal  Nutritionally Relevant Medications: Scheduled Meds:  cholecalciferol  2,000 Units Oral Daily   insulin aspart  0-5 Units Subcutaneous QHS   insulin aspart  0-9 Units Subcutaneous TID WC   pravastatin  40 mg Oral q1800   sodium zirconium cyclosilicate  5 g Oral BID   PRN Meds: ondansetron  Labs Reviewed: BUN 46, creatinine 1.78  NUTRITION - FOCUSED PHYSICAL EXAM: Flowsheet Row Most Recent Value  Orbital Region No  depletion  Upper Arm Region No depletion  Thoracic and Lumbar Region No depletion  Buccal Region No depletion  Temple Region No depletion  Clavicle Bone Region No depletion  Clavicle and Acromion Bone Region No depletion  Scapular Bone Region No depletion  Dorsal Hand No depletion  Patellar Region No depletion  Anterior Thigh Region No depletion  Posterior Calf Region No depletion  Edema (RD Assessment) Moderate  Hair Reviewed  Eyes Reviewed  Mouth Reviewed  Skin Reviewed  Nails Reviewed   Diet Order:   Diet Order             Diet heart healthy/carb modified Room service appropriate? Yes; Fluid consistency: Thin  Diet effective now                   EDUCATION NEEDS:  Education needs have been addressed  Skin:  Skin Assessment: Skin Integrity Issues: Skin Integrity Issues:: Other (Comment) Other: ulcers to both ankles  Last BM:  7/29 - type 3  Height:  Ht Readings from Last 1 Encounters:  12/03/20 '6\' 1"'$  (1.854 m)   Weight:  Wt Readings from Last 1 Encounters:  12/03/20 (!) 139.9 kg    Ideal Body Weight:  83.6 kg  BMI:  Body mass index is 40.69 kg/m.  Estimated Nutritional Needs:  Kcal:  2300-2500 kcal/d Protein:  115-130g/d Fluid:  2-2.2L/d  Ranell Patrick, RD, LDN Clinical Dietitian Pager on [place]

## 2020-12-04 NOTE — Progress Notes (Signed)
Pt. with bilateral lower extremity unna boot wraps with copious drainage coming thru, no orders for dressing changes, MD placed wound consult order.

## 2020-12-04 NOTE — Progress Notes (Signed)
Received MD order to discharge patient home, reviewed discharge instructions, prescriptions, follow up appointment and home meds with patient and patient verbalized understanding

## 2020-12-05 LAB — HEMOGLOBIN A1C
Hgb A1c MFr Bld: 7 % — ABNORMAL HIGH (ref 4.8–5.6)
Mean Plasma Glucose: 154 mg/dL

## 2020-12-06 LAB — GLUCOSE, CAPILLARY: Glucose-Capillary: 136 mg/dL — ABNORMAL HIGH (ref 70–99)

## 2020-12-09 IMAGING — US VENOUS DOPPLER ULTRASOUND OF LEFT LOWER EXTREMITY
1 series · 13 of 24 positions shown · non-contrast
Comparison: None.

CLINICAL DATA: Left foot swelling for 1 month



[Series 1: venous doppler ultrasound of left lower extremity · 0.10mm/px · 13 of 37 slices shown]
[im 1/37]
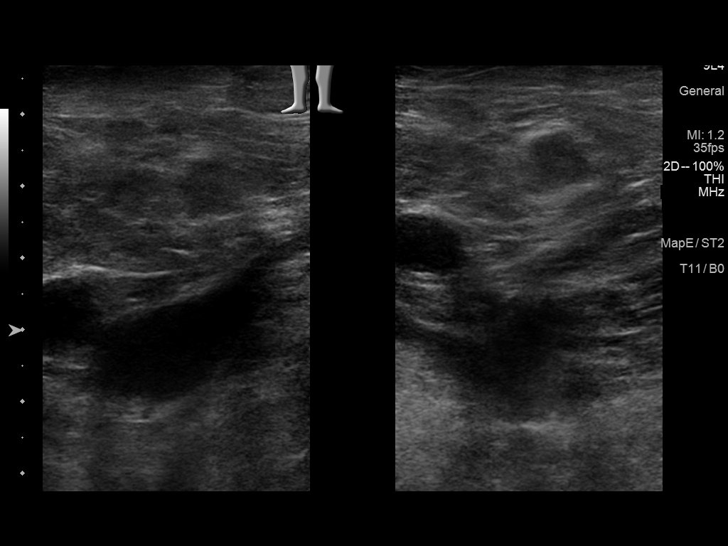
[im 4/37]
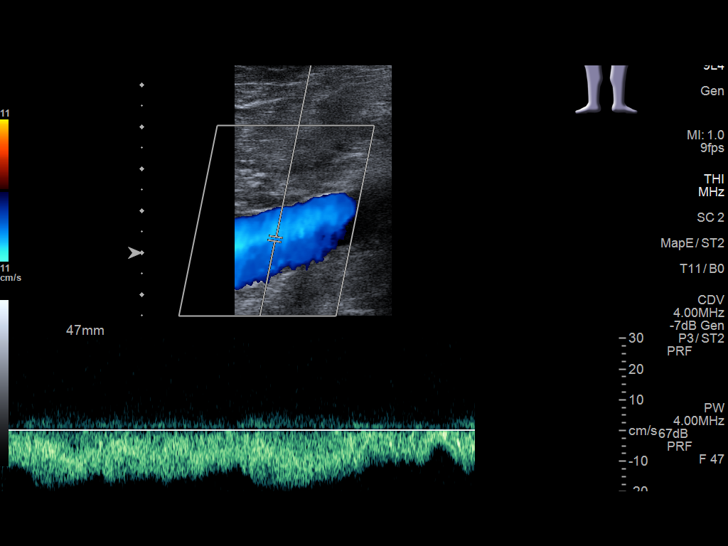
[im 7/37]
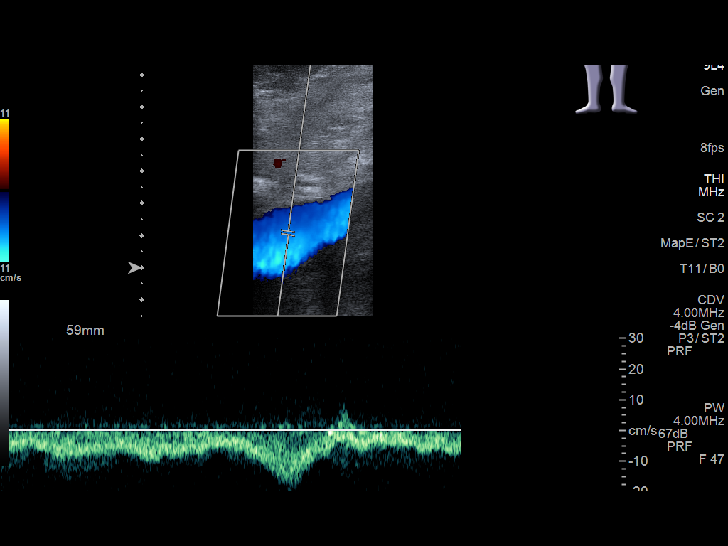
[im 10/37]
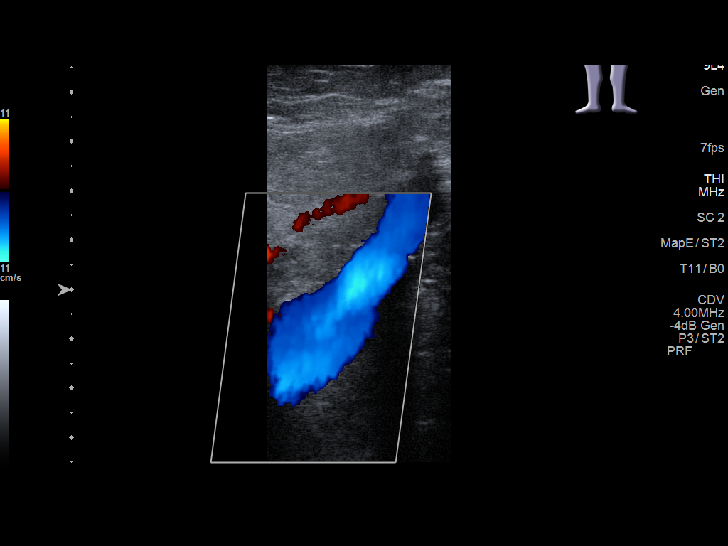
[im 13/37]
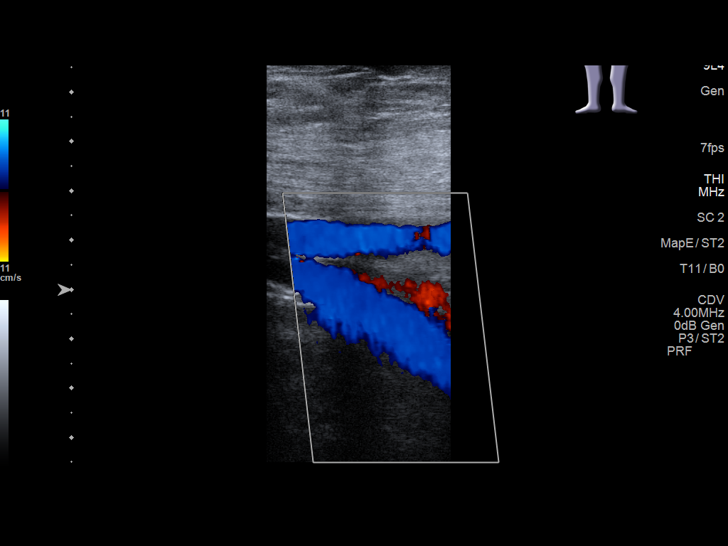
[im 16/37]
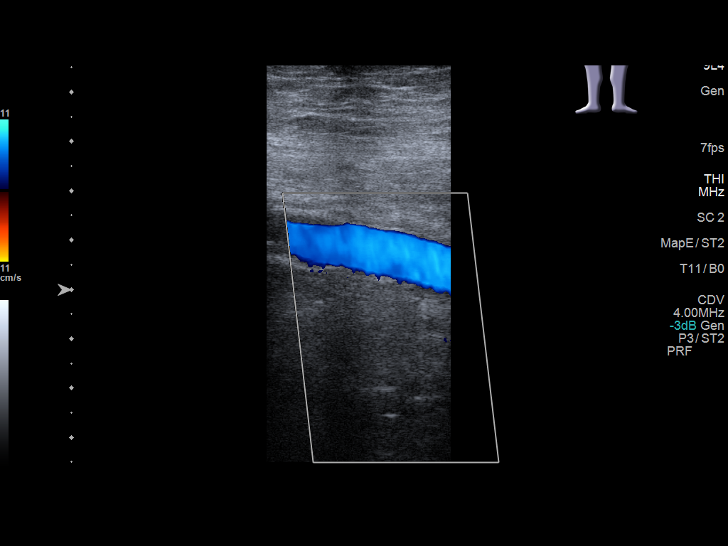
[im 19/37]
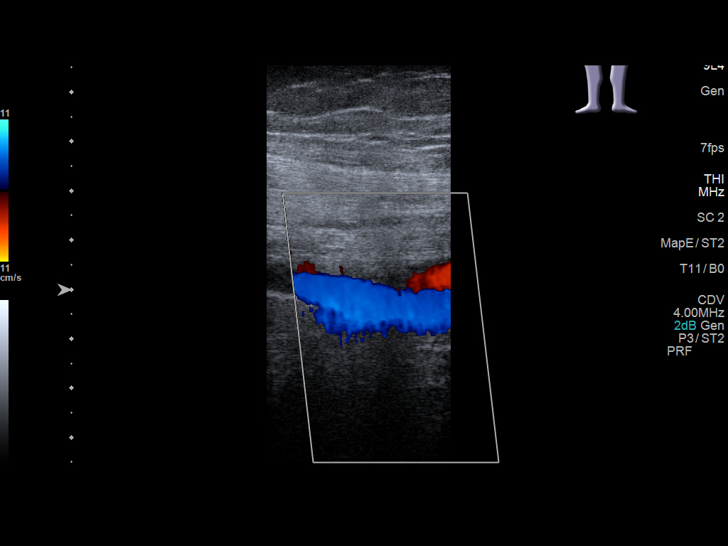
[im 21/37]
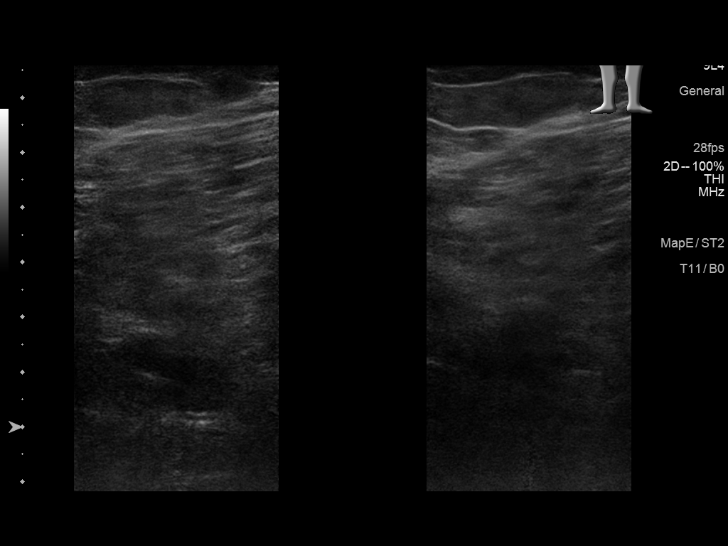
[im 24/37]
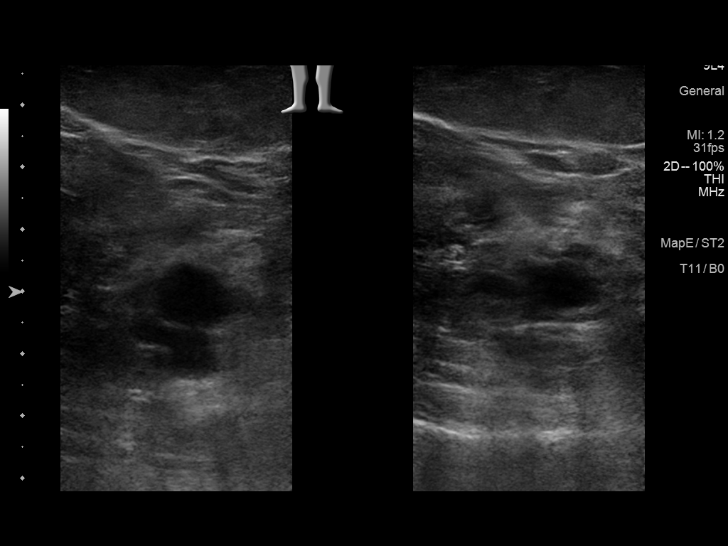
[im 27/37]
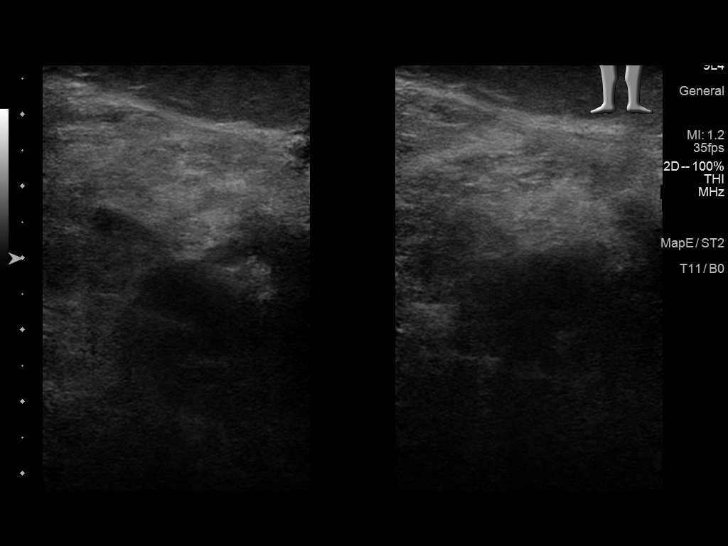
[im 30/37]
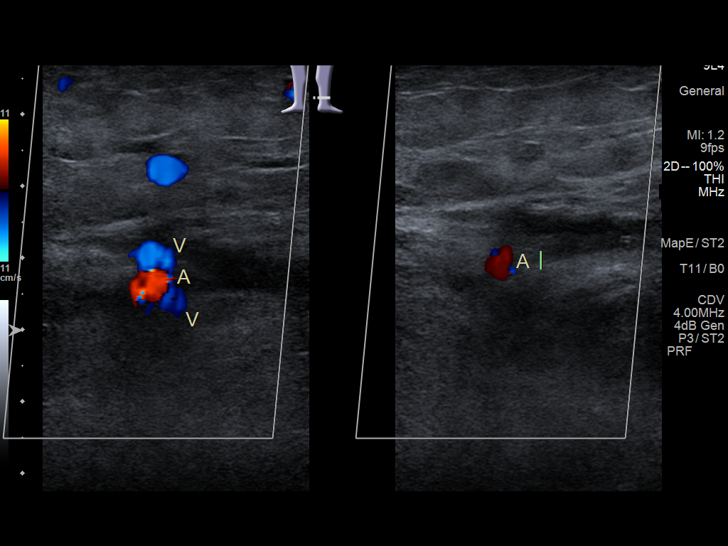
[im 33/37]
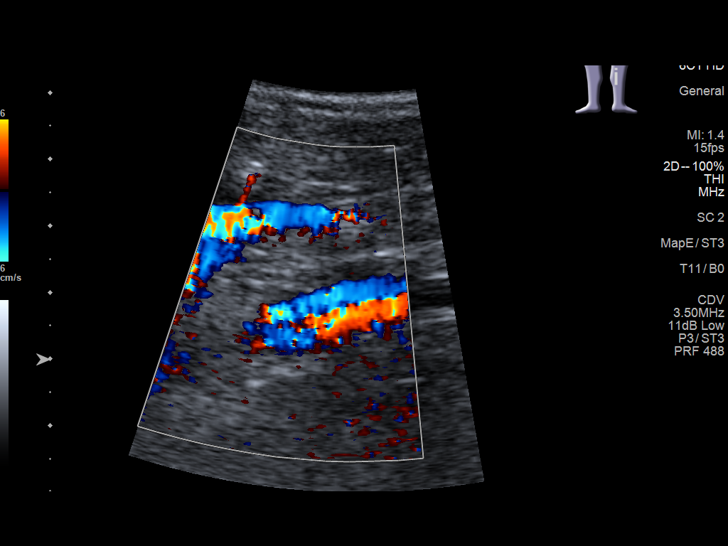
[im 37/37]
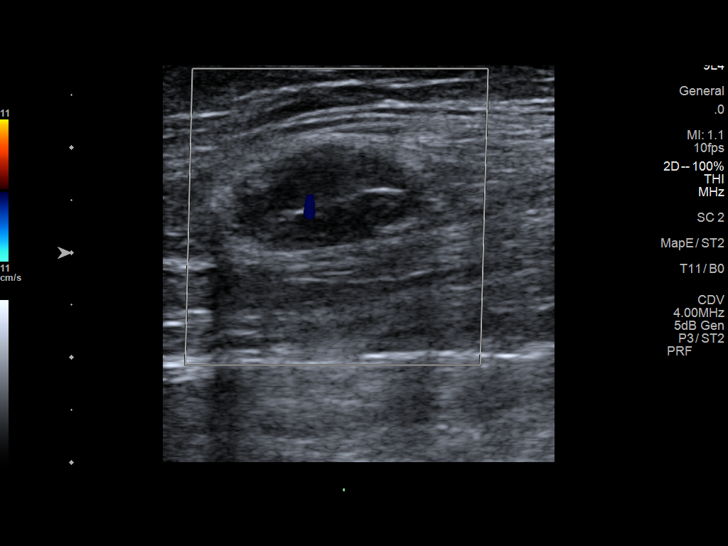

[13 of 24 positions shown; findings below may reference images not displayed]

FINDINGS: Contralateral Common Femoral Vein: Respiratory phasicity is normal
and symmetric with the symptomatic side. No evidence of thrombus.
Normal compressibility.

Common Femoral Vein: No evidence of thrombus. Normal
compressibility, respiratory phasicity and response to augmentation.

Saphenofemoral Junction: No evidence of thrombus. Normal
compressibility and flow on color Doppler imaging.

Profunda Femoral Vein: No evidence of thrombus. Normal
compressibility and flow on color Doppler imaging.

Femoral Vein: No evidence of thrombus. Normal compressibility,
respiratory phasicity and response to augmentation.

Popliteal Vein: No evidence of thrombus. Normal compressibility,
respiratory phasicity and response to augmentation.

Calf Veins: Limited because of body habitus and swelling. Tibial
vein appears patent. Peroneal vein not visualized. No gross
thrombus.

Superficial Great Saphenous Vein: No evidence of thrombus. Normal
compressibility.

Venous Reflux:  None.

Other Findings: Prominent right inguinal lymph node measures 2.3 cm
in length and 1.1 cm in short axis. This remains nonspecific.
IMPRESSION: No significant right lower extremity DVT by ultrasound. Limited
assessment of the calf veins.

Prominent right inguinal lymph node.

## 2020-12-10 ENCOUNTER — Encounter (INDEPENDENT_AMBULATORY_CARE_PROVIDER_SITE_OTHER): Payer: PPO

## 2020-12-10 DIAGNOSIS — I87319 Chronic venous hypertension (idiopathic) with ulcer of unspecified lower extremity: Secondary | ICD-10-CM | POA: Diagnosis not present

## 2020-12-15 DIAGNOSIS — L03116 Cellulitis of left lower limb: Secondary | ICD-10-CM | POA: Diagnosis not present

## 2020-12-17 ENCOUNTER — Encounter (INDEPENDENT_AMBULATORY_CARE_PROVIDER_SITE_OTHER): Payer: PPO

## 2020-12-24 ENCOUNTER — Other Ambulatory Visit: Payer: Self-pay

## 2020-12-24 ENCOUNTER — Encounter (INDEPENDENT_AMBULATORY_CARE_PROVIDER_SITE_OTHER): Payer: Self-pay | Admitting: Nurse Practitioner

## 2020-12-24 ENCOUNTER — Ambulatory Visit (INDEPENDENT_AMBULATORY_CARE_PROVIDER_SITE_OTHER): Payer: PPO | Admitting: Nurse Practitioner

## 2020-12-24 ENCOUNTER — Encounter (INDEPENDENT_AMBULATORY_CARE_PROVIDER_SITE_OTHER): Payer: PPO

## 2020-12-24 ENCOUNTER — Encounter (INDEPENDENT_AMBULATORY_CARE_PROVIDER_SITE_OTHER): Payer: Self-pay

## 2020-12-24 VITALS — BP 163/72 | HR 65 | Resp 16 | Wt 314.2 lb

## 2020-12-24 DIAGNOSIS — I10 Essential (primary) hypertension: Secondary | ICD-10-CM | POA: Diagnosis not present

## 2020-12-24 DIAGNOSIS — I83009 Varicose veins of unspecified lower extremity with ulcer of unspecified site: Secondary | ICD-10-CM | POA: Diagnosis not present

## 2020-12-24 DIAGNOSIS — N179 Acute kidney failure, unspecified: Secondary | ICD-10-CM | POA: Diagnosis not present

## 2020-12-24 DIAGNOSIS — L97909 Non-pressure chronic ulcer of unspecified part of unspecified lower leg with unspecified severity: Secondary | ICD-10-CM

## 2020-12-24 NOTE — Progress Notes (Signed)
Subjective:    Patient ID: [name], male    DOB: [birth year], 70 y.o.   MRN: [id number] Chief Complaint  Patient presents with  . Follow-up    Unna wrap follow up    [name] is a 70 year old male that presents today for evaluation of his lower extremity wounds following 2 weeks of IV antibiotics.  Patient had tried several oral antibiotics without any improvement in his symptoms.  Previous culture showed growth of multiple gram-negative rods as well as cultures before that showed Serratia marcens with previous cultures sensitive to cefepime.  The patient notes that the drainage and wound dried up while he was on IV antibiotics but once they were stopped the drainage has re started.  The skin in that area also appears improved but not completely healed.   Review of Systems  Cardiovascular:  Positive for leg swelling.  Skin:  Positive for wound.  All other systems reviewed and are negative.     Objective:   Physical Exam Vitals reviewed.  HENT:     Head: Normocephalic.  Cardiovascular:     Rate and Rhythm: Normal rate.  Pulmonary:     Effort: Pulmonary effort is normal.  Musculoskeletal:     Right lower leg: 3+ Edema present.     Left lower leg: 4+ Edema present.  Neurological:     Mental Status: He is alert and oriented to person, place, and time.  Psychiatric:        Mood and Affect: Mood normal.        Behavior: Behavior normal.        Thought Content: Thought content normal.        Judgment: Judgment normal.    BP (!) 163/72 (BP Location: Left Arm)   Pulse 65   Resp 16   Wt (!) 314 lb 3.2 oz (142.5 kg)   BMI 41.45 kg/m   Past Medical History:  Diagnosis Date  . Diabetes mellitus without complication ([place])   . Hyperlipidemia   . Hypertension   . Leukemia [facility])     Social History   Socioeconomic History  . Marital status: Divorced    Spouse name: Not on file  . Number of children: Not on file  . Years of education: Not on file  . Highest  education level: Not on file  Occupational History  . Not on file  Tobacco Use  . Smoking status: Former  . Smokeless tobacco: Never  Vaping Use  . Vaping Use: Never used  Substance and Sexual Activity  . Alcohol use: No  . Drug use: No  . Sexual activity: Not on file  Other Topics Concern  . Not on file  Social History Narrative  . Not on file   Social Determinants of Health   Financial Resource Strain: Not on file  Food Insecurity: Not on file  Transportation Needs: Not on file  Physical Activity: Not on file  Stress: Not on file  Social Connections: Not on file  Intimate Partner Violence: Not on file    Past Surgical History:  Procedure Laterality Date  . CATARACT EXTRACTION Right   . TONSILLECTOMY    . TOOTH EXTRACTION      Family History  Problem Relation Age of Onset  . Cancer Mother   . Stroke Father   . Cancer Sister     No Known Allergies  CBC Latest Ref Rng & Units 12/04/2020 12/03/2020  WBC 4.0 - 10.5 K/uL 7.0 5.8  Hemoglobin 13.0 -  17.0 g/dL 8.7(L) 9.1(L)  Hematocrit 39.0 - 52.0 % 28.6(L) 30.0(L)  Platelets 150 - 400 K/uL 193 186      CMP     Component Value Date/Time   NA 138 12/04/2020 0452   K 4.7 12/04/2020 0452   CL 109 12/04/2020 0452   CO2 22 12/04/2020 0452   GLUCOSE 155 (H) 12/04/2020 0452   BUN 46 (H) 12/04/2020 0452   CREATININE 1.78 (H) 12/04/2020 0452   CALCIUM 9.1 12/04/2020 0452   GFRNONAA 41 (L) 12/04/2020 0452     No results found.     Assessment & Plan:   1. Venous ulcer ([place]) Today although the venous ulceration is improved it is still having drainage associated from the area.  The patient notes that the drainage improved while he was on antibiotics but once they were stopped the drainage increased once again.  If possible we will try to coordinate for an additional few doses of IV antibiotics.  We also discussed referral to the wound care center however the patient does not wish to be seen by them at this point in  time.  We will continue to have the patient placed in bilateral Unna wraps and will reevaluate in 4 weeks.  We will continue to allow home health to do his wraps.  Based on most recent labs the most recent creatinine clearance was 72.4 mils per minute  2. AKI (acute kidney injury) ([place]) I discussed with the patient what his GFR is and the most recent levels that I received with his blood work that were drawn due to his IV antibiotics.  The most recent GFR drawn on 12/15/2020 was 37.  I discussed that this indicated that the patient had some kidney injury and worsening renal function which could possibly lead to dialysis.  Despite this discussion the patient did not wish to visit a nephrologist at this time.  The patient want to continue to follow with his [facility] physicians.  Patient will follow with his PCP for lab work and further treatment of his acute kidney injury.  3. Essential hypertension Continue antihypertensive medications as already ordered, these medications have been reviewed and there are no changes at this time.    Current Outpatient Medications on File Prior to Visit  Medication Sig Dispense Refill  . bisoprolol (ZEBETA) 5 MG tablet Take 1 tablet by mouth daily.    . Cholecalciferol 25 MCG (1000 UT) tablet Take 2 tablets by mouth daily.    . Ensure Max Protein (ENSURE MAX PROTEIN) LIQD Take 330 mLs (11 oz total) by mouth 2 (two) times daily. 330 mL 12  . furosemide (LASIX) 20 MG tablet Take 1 tablet (20 mg total) by mouth daily. 30 tablet 11  . hydrALAZINE (APRESOLINE) 10 MG tablet Take 10 mg by mouth 3 (three) times daily.    Marland Kitchen lisinopril (ZESTRIL) 40 MG tablet TAKE ONE TABLET BY MOUTH EVERY DAY FOR BLOOD PRESSURE **REPLACES LISINOPRIL/HCTZ    . metFORMIN (GLUCOPHAGE) 1000 MG tablet Take 1,000 mg by mouth 2 (two) times daily with a meal.    . Multiple Vitamin (MULTIVITAMIN WITH MINERALS) TABS tablet Take 1 tablet by mouth daily. 90 tablet 1  . nystatin (MYCOSTATIN/NYSTOP) powder Apply  to toe crevices with unna wrap changes 30 g 0  . pioglitazone (ACTOS) 15 MG tablet Take 15 mg by mouth daily.    . pravastatin (PRAVACHOL) 80 MG tablet Take 40 mg by mouth daily. daily    . sertraline (ZOLOFT) 100 MG  tablet Take 100 mg by mouth daily.     . silver sulfADIAZINE (SILVADENE) 1 % cream Apply to left ankle with unna wrap changes 400 g 0   Current Facility-Administered Medications on File Prior to Visit  Medication Dose Route Frequency Provider Last Rate Last Admin  . ceFEPIme (MAXIPIME) 2 g in dextrose 5 % 50 mL IVPB  2 g Intravenous Q12H Kris Hartmann, NP        There are no Patient Instructions on file for this visit. No follow-ups on file.   Kris Hartmann, NP

## 2020-12-31 ENCOUNTER — Ambulatory Visit (INDEPENDENT_AMBULATORY_CARE_PROVIDER_SITE_OTHER): Payer: PPO | Admitting: Nurse Practitioner

## 2020-12-31 DIAGNOSIS — L03116 Cellulitis of left lower limb: Secondary | ICD-10-CM | POA: Diagnosis not present

## 2021-01-05 DIAGNOSIS — L03116 Cellulitis of left lower limb: Secondary | ICD-10-CM | POA: Diagnosis not present

## 2021-01-14 ENCOUNTER — Encounter (INDEPENDENT_AMBULATORY_CARE_PROVIDER_SITE_OTHER): Payer: Self-pay | Admitting: Nurse Practitioner

## 2021-01-14 ENCOUNTER — Ambulatory Visit (INDEPENDENT_AMBULATORY_CARE_PROVIDER_SITE_OTHER): Payer: PPO | Admitting: Nurse Practitioner

## 2021-01-14 ENCOUNTER — Other Ambulatory Visit (INDEPENDENT_AMBULATORY_CARE_PROVIDER_SITE_OTHER): Payer: Self-pay | Admitting: Nurse Practitioner

## 2021-01-14 ENCOUNTER — Other Ambulatory Visit: Payer: Self-pay

## 2021-01-14 VITALS — BP 163/66 | HR 93 | Ht 73.0 in | Wt 314.0 lb

## 2021-01-14 DIAGNOSIS — L97909 Non-pressure chronic ulcer of unspecified part of unspecified lower leg with unspecified severity: Secondary | ICD-10-CM | POA: Diagnosis not present

## 2021-01-14 DIAGNOSIS — N179 Acute kidney failure, unspecified: Secondary | ICD-10-CM

## 2021-01-14 DIAGNOSIS — I10 Essential (primary) hypertension: Secondary | ICD-10-CM

## 2021-01-14 DIAGNOSIS — I83009 Varicose veins of unspecified lower extremity with ulcer of unspecified site: Secondary | ICD-10-CM | POA: Diagnosis not present

## 2021-01-14 DIAGNOSIS — Z7689 Persons encountering health services in other specified circumstances: Secondary | ICD-10-CM | POA: Diagnosis not present

## 2021-01-14 DIAGNOSIS — E782 Mixed hyperlipidemia: Secondary | ICD-10-CM

## 2021-01-14 DIAGNOSIS — R262 Difficulty in walking, not elsewhere classified: Secondary | ICD-10-CM | POA: Insufficient documentation

## 2021-01-14 NOTE — Progress Notes (Signed)
Subjective:    Patient ID: [name], male    DOB: 04-26-1951, 70 y.o.   MRN: [id number] Chief Complaint  Patient presents with   Follow-up    4 wk BIL unna boot check    [name] is a 70 year old male that presents today for evaluation of his lower extremity wounds following an additional 2 weeks of IV antibiotics.  Patient had tried several oral antibiotics without any improvement in his symptoms.  Previous culture showed growth of multiple gram-negative rods as well as cultures before that showed Serratia marcens with previous cultures sensitive to cefepime.  The patient notes that he is still continue to have some drainage in the left distal posterior calf area.  She also notes worsening renal function as noted per his doctor at the [place].  Continues to be in [place] wraps and he continues to tolerate these well.   Review of Systems  Cardiovascular:  Positive for leg swelling.  Skin:  Positive for wound.      Objective:   Physical Exam Vitals reviewed.  HENT:     Head: Normocephalic.  Cardiovascular:     Rate and Rhythm: Normal rate.     Comments: Unable to palpate pedal pulses due to edema Pulmonary:     Effort: Pulmonary effort is normal.  Musculoskeletal:     Right lower leg: 2+ Edema present.     Left lower leg: 4+ Edema present.  Skin:    General: Skin is warm.     Findings: Wound present.  Neurological:     Mental Status: He is alert and oriented to person, place, and time.     Gait: Gait abnormal.  Psychiatric:        Mood and Affect: Mood normal.        Behavior: Behavior normal.        Thought Content: Thought content normal.        Judgment: Judgment normal.    BP (!) 163/66   Pulse 93   Ht '6\' 1"'$  (1.854 m)   Wt (!) 314 lb (142.4 kg)   BMI 41.43 kg/m   Past Medical History:  Diagnosis Date   Diabetes mellitus without complication (HCC)    Hyperlipidemia    Hypertension    Leukemia ([place])     Social History   Socioeconomic History   Marital  status: Divorced    Spouse name: Not on file   Number of children: Not on file   Years of education: Not on file   Highest education level: Not on file  Occupational History   Not on file  Tobacco Use   Smoking status: Former   Smokeless tobacco: Never  Vaping Use   Vaping Use: Never used  Substance and Sexual Activity   Alcohol use: No   Drug use: No   Sexual activity: Not on file  Other Topics Concern   Not on file  Social History Narrative   Not on file   Social Determinants of Health   Financial Resource Strain: Not on file  Food Insecurity: Not on file  Transportation Needs: Not on file  Physical Activity: Not on file  Stress: Not on file  Social Connections: Not on file  Intimate Partner Violence: Not on file    Past Surgical History:  Procedure Laterality Date   CATARACT EXTRACTION Right    TONSILLECTOMY     TOOTH EXTRACTION      Family History  Problem Relation Age of Onset   Cancer  Mother    Stroke Father    Cancer Sister     No Known Allergies  CBC Latest Ref Rng & Units 12/04/2020 12/03/2020  WBC 4.0 - 10.5 K/uL 7.0 5.8  Hemoglobin 13.0 - 17.0 g/dL 8.7(L) 9.1(L)  Hematocrit 39.0 - 52.0 % 28.6(L) 30.0(L)  Platelets 150 - 400 K/uL 193 186      CMP     Component Value Date/Time   NA 138 12/04/2020 0452   K 4.7 12/04/2020 0452   CL 109 12/04/2020 0452   CO2 22 12/04/2020 0452   GLUCOSE 155 (H) 12/04/2020 0452   BUN 46 (H) 12/04/2020 0452   CREATININE 1.78 (H) 12/04/2020 0452   CALCIUM 9.1 12/04/2020 0452   GFRNONAA 41 (L) 12/04/2020 0452     No results found.     Assessment & Plan:   1. Venous ulcer ([place]) The patient has an area on his left calf that has been very difficult to treat.  Despite IV antibiotics the patient still continues to have drainage and foul-smelling odor.  This is possible that it is related to weeping from his worsening kidney function.  At this time we will make the decision to stop his IV antibiotics but we will  also reculture the wound to see if there are any lingering infections that possibly were not treated resistant to the IV antibiotics he recently received.  Home health will continue with his own wraps.  We will also refer the patient to the wound care center.  We have previously suggested referral to the wound care center however the patient was resistant and did not wish to go.  He is now open to the idea and would like a referral. - AMB referral to wound care center  2. Essential hypertension Continue antihypertensive medications as already ordered, these medications have been reviewed and there are no changes at this time.   3. Mixed hyperlipidemia Continue statin as ordered and reviewed, no changes at this time   4. AKI (acute kidney injury) [facility]) The patient has had some worsening renal function.  It was previously suggested to the patient to have a referral to a nephrologist but he was resistant.  He recently received a referral from his [place] and will be seeing nephrology soon.   Current Outpatient Medications on File Prior to Visit  Medication Sig Dispense Refill   bisoprolol (ZEBETA) 5 MG tablet Take 1 tablet by mouth daily.     Cholecalciferol 25 MCG (1000 UT) tablet Take 2 tablets by mouth daily.     Ensure Max Protein (ENSURE MAX PROTEIN) LIQD Take 330 mLs (11 oz total) by mouth 2 (two) times daily. 330 mL 12   hydrALAZINE (APRESOLINE) 10 MG tablet Take 10 mg by mouth 3 (three) times daily.     lisinopril (ZESTRIL) 40 MG tablet TAKE ONE TABLET BY MOUTH EVERY DAY FOR BLOOD PRESSURE **REPLACES LISINOPRIL/HCTZ     Multiple Vitamin (MULTIVITAMIN WITH MINERALS) TABS tablet Take 1 tablet by mouth daily. 90 tablet 1   nystatin (MYCOSTATIN/NYSTOP) powder Apply to toe crevices with unna wrap changes 30 g 0   pioglitazone (ACTOS) 15 MG tablet Take 15 mg by mouth daily.     pravastatin (PRAVACHOL) 80 MG tablet Take 40 mg by mouth daily. daily     sertraline (ZOLOFT) 100 MG tablet Take  100 mg by mouth daily.      silver sulfADIAZINE (SILVADENE) 1 % cream Apply to left ankle with unna wrap changes 400 g  0   furosemide (LASIX) 20 MG tablet Take 1 tablet (20 mg total) by mouth daily. (Patient not taking: Reported on 01/14/2021) 30 tablet 11   metFORMIN (GLUCOPHAGE) 1000 MG tablet Take 1,000 mg by mouth 2 (two) times daily with a meal. (Patient not taking: Reported on 01/14/2021)     Current Facility-Administered Medications on File Prior to Visit  Medication Dose Route Frequency Provider Last Rate Last Admin   ceFEPIme (MAXIPIME) 2 g in dextrose 5 % 50 mL IVPB  2 g Intravenous Q12H Kris Hartmann, NP        There are no Patient Instructions on file for this visit. No follow-ups on file.   Kris Hartmann, NP

## 2021-01-15 ENCOUNTER — Telehealth (INDEPENDENT_AMBULATORY_CARE_PROVIDER_SITE_OTHER): Payer: Self-pay | Admitting: Vascular Surgery

## 2021-01-15 NOTE — Telephone Encounter (Signed)
Cecille Rubin will moved forward with removing pic line

## 2021-01-15 NOTE — Telephone Encounter (Signed)
Yes that is fine

## 2021-01-15 NOTE — Telephone Encounter (Signed)
Called to request orders for Patients' pick line to be removed. Please advise.

## 2021-01-21 ENCOUNTER — Ambulatory Visit (INDEPENDENT_AMBULATORY_CARE_PROVIDER_SITE_OTHER): Payer: PPO | Admitting: Vascular Surgery

## 2021-01-23 LAB — AEROBIC CULTURE

## 2021-01-26 ENCOUNTER — Other Ambulatory Visit: Payer: Self-pay

## 2021-01-26 ENCOUNTER — Encounter: Payer: PPO | Attending: Physician Assistant | Admitting: Physician Assistant

## 2021-01-26 DIAGNOSIS — I890 Lymphedema, not elsewhere classified: Secondary | ICD-10-CM | POA: Diagnosis not present

## 2021-01-26 DIAGNOSIS — L97512 Non-pressure chronic ulcer of other part of right foot with fat layer exposed: Secondary | ICD-10-CM | POA: Insufficient documentation

## 2021-01-26 DIAGNOSIS — I10 Essential (primary) hypertension: Secondary | ICD-10-CM | POA: Insufficient documentation

## 2021-01-26 DIAGNOSIS — I872 Venous insufficiency (chronic) (peripheral): Secondary | ICD-10-CM | POA: Insufficient documentation

## 2021-01-26 DIAGNOSIS — E11622 Type 2 diabetes mellitus with other skin ulcer: Secondary | ICD-10-CM | POA: Diagnosis not present

## 2021-01-26 DIAGNOSIS — E11621 Type 2 diabetes mellitus with foot ulcer: Secondary | ICD-10-CM | POA: Diagnosis not present

## 2021-01-26 DIAGNOSIS — L97822 Non-pressure chronic ulcer of other part of left lower leg with fat layer exposed: Secondary | ICD-10-CM | POA: Diagnosis not present

## 2021-01-26 DIAGNOSIS — E1140 Type 2 diabetes mellitus with diabetic neuropathy, unspecified: Secondary | ICD-10-CM | POA: Insufficient documentation

## 2021-01-26 DIAGNOSIS — E1151 Type 2 diabetes mellitus with diabetic peripheral angiopathy without gangrene: Secondary | ICD-10-CM | POA: Insufficient documentation

## 2021-01-26 DIAGNOSIS — L97929 Non-pressure chronic ulcer of unspecified part of left lower leg with unspecified severity: Secondary | ICD-10-CM | POA: Diagnosis present

## 2021-01-28 DIAGNOSIS — H2512 Age-related nuclear cataract, left eye: Secondary | ICD-10-CM | POA: Diagnosis not present

## 2021-01-28 NOTE — Progress Notes (Signed)
[name], Mooney ([id number]) Visit Report for 01/26/2021 Abuse/Suicide Risk Screen Details Patient Name: [name], [name] Date of Service: 01/26/2021 8:45 AM Medical Record Number: [id number] Patient Account Number: [id number] Date of Birth/Sex: 01-Jul-1950 (70 y.o. M) Treating RN: [name] Primary Care Rayelle Armor: Felipa Eth Other Clinician: Referring Breland Trouten: Eulogio Ditch Treating Kody Vigil/Extender: Skipper Cliche in Treatment: 0 Abuse/Suicide Risk Screen Items Answer ABUSE RISK SCREEN: Has anyone close to you tried to hurt or harm you recentlyo No Do you feel uncomfortable with anyone in your familyo No Has anyone forced you do things that you didnot want to doo No Electronic Signature(s) Signed: 01/28/2021 1:17:18 PM By: [name] RN Entered By: [name] on 01/26/2021 09:09:10 [name] ([id number]) -------------------------------------------------------------------------------- Activities of Daily Living Details Patient Name: [name], Mooney Date of Service: 01/26/2021 8:45 AM Medical Record Number: [id number] Patient Account Number: [id number] Date of Birth/Sex: Mar 07, 1951 (70 y.o. M) Treating RN: [name] Primary Care [place]: Felipa Eth Other Clinician: Referring Keean Wilmeth: Eulogio Ditch Treating Brennen Camper/Extender: Skipper Cliche in Treatment: 0 Activities of Daily Living Items Answer Activities of Daily Living (Please select one for each item) Drive Automobile Completely Able Take Medications Completely Able Use Telephone Completely Able Care for Appearance Completely Able Use Toilet Completely Able Bath / Shower Completely Able Dress Self Completely Able Feed Self Completely Able Walk Completely Able Get In / Out Bed Completely Able Housework Completely Able Prepare Meals Completely Able Handle Money Completely Able Shop for Self Completely Able Electronic Signature(s) Signed: 01/28/2021 1:17:18 PM By: [name] RN Entered By: [name] on 01/26/2021 09:17:17 [name] ([id number]) -------------------------------------------------------------------------------- Education Screening Details Patient Name: [name] Date of Service: 01/26/2021 8:45 AM Medical Record Number: [id number] Patient Account Number: [id number] Date of Birth/Sex: Aug 24, 1950 (70 y.o. M) Treating RN: [name] Primary Care Tumeka Chimenti: Felipa Eth Other Clinician: Referring Kentravious Lipford: Eulogio Ditch Treating Kaleah Hagemeister/Extender: Skipper Cliche in Treatment: 0 Primary Learner Assessed: Patient Learning Preferences/Education Level/Primary Language Learning Preference: Explanation Highest Education Level: High School Preferred Language: English Cognitive Barrier Language Barrier: No Translator Needed: No Memory Deficit: No Emotional Barrier: No Cultural/Religious Beliefs Affecting Medical Care: No Physical Barrier Impaired Vision: No Impaired Hearing: No Decreased Hand dexterity: No Knowledge/Comprehension Knowledge Level: Medium Comprehension Level: High Ability to understand written instructions: High Ability to understand verbal instructions: High Motivation Anxiety Level: Anxious Cooperation: Cooperative Education Importance: Acknowledges Need Interest in Health Problems: Asks Questions Perception: Coherent Willingness to Engage in Self-Management High Activities: Readiness to Engage in Self-Management High Activities: Electronic Signature(s) Signed: 01/28/2021 1:17:18 PM By: [name] RN Entered By: [name] on 01/26/2021 09:18:08 [name] ([id number]) -------------------------------------------------------------------------------- Fall Risk Assessment Details Patient Name: [name] Date of Service: 01/26/2021 8:45 AM Medical Record Number: [id number] Patient Account Number: [id number] Date of Birth/Sex: 11/09/1950 (70 y.o. M) Treating RN: [name] Primary Care Diamond Martucci: Felipa Eth  Other Clinician: Referring Elaiza Shoberg: Eulogio Ditch Treating Cherice Glennie/Extender: Skipper Cliche in Treatment: 0 Fall Risk Assessment Items Have you had 2 or more falls in the last 12 monthso 0 No Have you had any fall that resulted in injury in the last 12 monthso 0 No FALLS RISK SCREEN History of falling - immediate or within 3 months 0 No Secondary diagnosis (Do you have 2 or more medical diagnoseso) 0 No Ambulatory aid None/bed rest/wheelchair/nurse 0 No Crutches/cane/walker 0 No Furniture 0 No Intravenous therapy Access/Saline/Heparin Lock 0 No Gait/Transferring Normal/ bed rest/ wheelchair 0 No Weak (short steps with or without shuffle, stooped but able to lift head  while walking, may 0 No seek support from furniture) Impaired (short steps with shuffle, may have difficulty arising from chair, head down, impaired 0 No balance) Mental Status Oriented to own ability 0 No Electronic Signature(s) Signed: 01/28/2021 1:17:18 PM By: [name] RN Entered By: [name] on 01/26/2021 09:18:22 [name] ([id number]) -------------------------------------------------------------------------------- Foot Assessment Details Patient Name: [name] Date of Service: 01/26/2021 8:45 AM Medical Record Number: [id number] Patient Account Number: [id number] Date of Birth/Sex: 04-08-1951 (70 y.o. M) Treating RN: [name] Primary Care Keith Felten: Felipa Eth Other Clinician: Referring Ladawn Boullion: Eulogio Ditch Treating Cartrell Bentsen/Extender: Skipper Cliche in Treatment: 0 Foot Assessment Items Site Locations + = Sensation present, - = Sensation absent, C = Callus, U = Ulcer R = Redness, W = Warmth, M = Maceration, PU = Pre-ulcerative lesion F = Fissure, S = Swelling, D = Dryness Assessment Right: Left: Other Deformity: No No Prior Foot Ulcer: No No Prior Amputation: No No Charcot Joint: No No Ambulatory Status: Ambulatory Without Help Gait: Steady Electronic  Signature(s) Signed: 01/28/2021 1:17:18 PM By: [name] RN Entered By: [name] on 01/26/2021 09:20:12 [name] ([id number]) -------------------------------------------------------------------------------- Nutrition Risk Screening Details Patient Name: [name] Date of Service: 01/26/2021 8:45 AM Medical Record Number: [id number] Patient Account Number: [id number] Date of Birth/Sex: 1950-07-20 (70 y.o. M) Treating RN: [name] Primary Care Kele Barthelemy: Felipa Eth Other Clinician: Referring Marne Meline: Eulogio Ditch Treating Jesper Stirewalt/Extender: Skipper Cliche in Treatment: 0 Height (in): 73 Weight (lbs): 312 Body Mass Index (BMI): 41.2 Nutrition Risk Screening Items Score Screening NUTRITION RISK SCREEN: I have an illness or condition that made me change the kind and/or amount of food I eat 0 No I eat fewer than two meals per day 0 No I eat few fruits and vegetables, or milk products 0 No I have three or more drinks of beer, liquor or wine almost every day 0 No I have tooth or mouth problems that make it hard for me to eat 0 No I don't always have enough money to buy the food I need 0 No I eat alone most of the time 0 No I take three or more different prescribed or over-the-counter drugs a day 1 Yes Without wanting to, I have lost or gained 10 pounds in the last six months 0 No I am not always physically able to shop, cook and/or feed myself 2 Yes Nutrition Protocols Good Risk Protocol Moderate Risk Protocol 0 Provide education on nutrition High Risk Proctocol Risk Level: Moderate Risk Score: 3 Electronic Signature(s) Signed: 01/28/2021 1:17:18 PM By: [name] RN Entered By: [name] on 01/26/2021 09:18:42

## 2021-01-28 NOTE — Progress Notes (Signed)
JAVIN, NONG ([id number]) Visit Report for 01/26/2021 Chief Complaint Document Details Patient Name: [name], [name] Date of Service: 01/26/2021 8:45 AM Medical Record Number: [id number] Patient Account Number: [id number] Date of Birth/Sex: 1950/05/30 (70 y.o. M) Treating RN: [name] Primary Care Provider: Felipa Eth Other Clinician: Referring Provider: Eulogio Ditch Treating Provider/Extender: Skipper Cliche in Treatment: 0 Information Obtained from: Patient Chief Complaint Left LE ulcers Electronic Signature(s) Signed: 01/26/2021 9:12:42 AM By: Worthy Keeler PA-C Entered By: Worthy Keeler on 01/26/2021 09:12:41 [name] ([id number]) -------------------------------------------------------------------------------- HPI Details Patient Name: [name] Date of Service: 01/26/2021 8:45 AM Medical Record Number: [id number] Patient Account Number: [id number] Date of Birth/Sex: 1951-01-15 (70 y.o. M) Treating RN: [name] Primary Care Provider: Felipa Eth Other Clinician: Referring Provider: Eulogio Ditch Treating Provider/Extender: Skipper Cliche in Treatment: 0 History of Present Illness HPI Description: 70 year old male who presented to the ER with bilateral lower extremity blisters which had started last week. he has a past medical history of leukemia, diabetes mellitus, hypertension, edema of both lower extremities, his recurrent skin infections, peripheral vascular disease, coronary artery disease, congestive heart failure and peripheral neuropathy. in the ER he was given Rocephin and put on Silvadene cream. he was put on oral doxycycline and was asked to follow-up with the [facility]. His last hemoglobin A1c was 6.6 in December and he checks his blood sugar once a week. He does not have any physicians outside the [place] system. He does not recall any vascular duplex studies done either for arterial or venous disease but was told to wear compression  stockings which he does not use 05/30/2016 -- we have not yet received any of his notes from the [facility] hospital system and his arterial and venous duplex studies are scheduled here in [place] around mid February. We are unable to have his insurance accepted by home health agencies and hence he is getting dressings only once a week. 06/06/16 -- -- I received a call from the patient's PCP at the [facility] and spoke to Dr. Garvin Fila, phone number [phone] and fax number [phone]. She confirmed that no vascular testing was done over the last 5 years and she would be happy to do them if the patient did want them to be done at the [place] and we could fax him a request. Readmission: 70 year old male seen by as in February of this year and was referred to vein and vascular for studies and opinion from the vascular surgeons. The patient returns today with a fresh problem having had blisters on his left lower extremity which have been there for about 5 days and he clearly states that he has been wearing his compression stockings as advised though he could not read the moderate compression and has been wearing light compression. Review of his electronic medical records note that he had lower extremity arterial duplex examination done on 06/23/2016 which showed no hemodynamically significant stenosis in the bilateral lower extremity arterial system. He also had a lower extremity venous reflux examination done on 07/07/2016 and it was noted that he had venous incompetence in the right great saphenous vein and bilateral common femoral veins. Patient was seen by Dr. Tamala Julian on the same day and for some reason his notes do not reflect the venous studies or the arterial studies and he recommended patient do a venous duplex ultrasound to look for reflux and return to see him.he would also consider a lymph pump if required. The patient was told that his workup  was normal and hence the patient canceled his  follow-up appointment. 02/03/17 on evaluation today patient left medial lower extremity blister appears to be doing about the same. It is still continuing to drain and there's still the blistered skin covering the wound bed which is making it difficult for the alternate to do its job. Fortunately there is no evidence of cellulitis. No fevers chills noted. Patient states in general he is not having any significant discomfort. Patient's lower extremity arterial duplex exam revealed that patient was hemodynamically stable with no evidence of stenosis in regard to the bilateral lower extremities. The lower extremity venous reflux exam revealed the patient had venous incontinence noted in the right greater saphenous and bilateral common femoral vein. There is no evidence of deep or superficial vein thrombosis in the bilateral lower extremities. Readmission: 11/12/18 Patient presents for evaluation our clinic today concerning issues that he is having with his left lower extremity. He tells me that a couple weeks ago he began developing blisters on the left lower extremity along with increased swelling. He typically wears his compression stockings on a regular basis is previously been evaluated both here as well is with vascular surgery they would recommend lymphedema pumps but unfortunately that somehow fell through and he never heard anything back from that. Nonetheless I think lymphedema pumps would be beneficial for this patient. He does have a history of hypertension and diabetes. Obviously the chronic venous stasis and lymphedema as well. At this point the blisters have been given in more trouble he states sometimes when the blisters openings able to clean it down with alcohol and it will dry out and do well. Unfortunately that has not been the case this time. He is having some discomfort although this mean these with cleaning the areas he doesn't have discomfort just on a regular basis. He has not been  able to wear his compression stockings since the blisters arose due to the fact that of course it will drain into the socks causing additional issues and he didn't have any way to wrap this otherwise. He has increased to taking his Lasix every day instead of every other day. He sees his primary care provider later this month as well. No fevers, chills, nausea, or vomiting noted at this time. 11/19/18-Patient returns at 1 week, per intake RN the amount of seepage into the compression wraps was definitely improved, overall all the wounds are measuring smaller but continuing silver alginate to the wounds as primary dressing 11/26/18 on evaluation today patient appears to be doing quite well in regard to his left lower Trinity ulcers. In fact of the areas that were noted initially he only has two regions still open. There is no evidence of active infection at this time. He still is not heard anything from the company regarding lymphedema pumps as of yet. Again as previously seen vascular they have not recommended any surgical intervention. 12/03/2018 on evaluation today patient actually appears to be doing quite well with regard to his lower extremity ulcers. In fact most of the areas appear to be healed the one spot which does not seem to be completely healed I am unsure of whether or not this is really draining that much but nonetheless there does not appear to be any signs of infection or significant drainage at this point. There is no sign of fever, chills, nausea, vomiting, or diarrhea. Overall I am pleased with how things have progressed I think is very close to being able to transition  to his home compression stockings. [name], [name] ([id number]) 12/10/2018 upon evaluation today patient appears to be doing quite well with regard to his left lower extremity. He has been tolerating the dressing changes without complication. Fortunately there is no signs of active infection at this time. He appears after  thorough evaluation of his leg to only have 1 small area that remains open at this point everything else appears to be almost completely closed. He still have significant swelling of the left lower extremity. We had discussed discussing this with his primary care provider he is not able to see her in person they were at the [facility] and right now the [place] is not seeing patients on site. According to the patient anyway. Subsequently he did speak with her apparently and his primary care provider feels that he may likely have a DVT. With that being said she has not seen his leg she is just going off of his history. Nonetheless that is a concern that the patient now has as well and while I do not feel the DVT is likely we can definitely ensure that that is not the case I will go ahead and see about putting that order in today. Nonetheless otherwise I am in a recommend that we continue with the current wound care measures including the compression therapy most likely. We just need to ensure that his leg is indeed free of any DVTs. 12/17/2018 on evaluation today patient actually appears to be completely healed today. He does have 2 very small areas of blistering although this is not anything too significant at this point which is good news. With that being said I am in agreement with the fact that I think he is completely healed at this point. He does want to get back into his compression stocking. The good news is we have gotten approval from insurance for his lymphedema pumps we received a letter since last saw him last week. The other good news is his study did come back and showed no evidence of a DVT. 12/20/2018 on evaluation today patient presents for follow-up concerning his ongoing issues with his left lower extremity. He was actually discharged last Friday and did fairly well until he states blisters opened this morning. He tells me he has been wearing his compression stocking although he has a hard  time getting this on. There does not appear to be any signs of active infection at this time. No fevers, chills, nausea, vomiting, or diarrhea. 12/27/2018 on evaluation today patient appears to be doing very well with regard to his swelling of the left lower extremity the 4 layer compression wrap seems to have been beneficial for him. Fortunately there is no signs of active infection at this time. Patient has been tolerating the compression wrap without complication and his foot swelling in particular appears to be greatly improved. He does still have a wound on the lateral portion of his left leg I believe this is more of a blister that has now reopened. 01/03/2019 on evaluation today patient actually appears to be doing excellent in regard to his left lower extremity. He did receive his compression pumps and is actually use this 7 times since he was last here in the office. On top of the compression wrap he is now roughly 3 cm better at the calf and 2 cm better at the ankle he also states that his foot seem to go [name] issue better without even having to use a shoe horn. Obviously I  think this is all evidence that he is doing excellent in this regard. The other good news is he does not appear to have anything open today as far as wounds are concerned. 01/15/2019 on evaluation today patient appears to be doing more poorly yet again with regard to his left lower extremity. He has developed new wounds again after being discharged just recently. Unfortunately this continues to be the case that he will heal and then have subsequent new wounds. The last time I was hopeful that he may not end up coming back too quickly especially since he states he has been using his lymphedema pumps along with wearing his compression. Nonetheless he had a blister on the back of his leg that popped up on the left and this has opened up into [name] ulceration it is quite painful. 01/22/19 on evaluation today patient actually appears  to be doing well with regard to his wound on the left lower extremity. He's been tolerating the dressing changes without complication including the compression wrap in the wound appears to be significantly smaller today which is great news. Overall very pleased in this regard. 01/29/2019 on evaluation today patient appears to be doing well with regard to his left posterior lower extremity ulcer. He has been tolerating the dressing changes without complication. This is not completely healed but is getting much closer. We did order a Farrow wrap 4000 for him he has received this and has it with him today although I am not sure we are quite ready to start him on that as of yet. We are very close. 02/05/2019 on evaluation today patient actually appears to be doing quite well with regard to his left posterior lower extremity ulcer. He still has a very tiny opening remaining but the fortunate thing is he seems to be healing quite nicely. He also did get his Farrow wrap which I am hoping will help with his edema control as well at home. Fortunately there is no evidence of active infection. 02/12/2019 patient and fortunately appears to be doing poorly in regard to his wounds of the left lower extremity. He was very close to healing therefore we attempted to use his Velcro compression wraps continuing with lymphedema pumps at home. Unfortunately that does not seem to have done very well for him. He tells me that he wore them all the time but again I am not sure why if that is the case that he is having such significant edema. He is still on his fluid pills as well. With that being said there is no obvious sign of infection although I do wonder about the possibility of infection at this time as well. 02/19/2019 unfortunately upon evaluation today patient appears to be doing more poorly with regard to his left lower extremity. He is not showing signs of significant improvement and I think the biggest issue here is  that he does have [name] infection that appears to likely be Pseudomonas. That is based on the blue-green drainage that were noted at this time. Unfortunately the antibiotic that has been on is not going to take care of this at all. I think they will get a need to switch him to either Levaquin or Cipro and this was discussed with the patient. 02/26/2019 on evaluation today patient's lower extremity on the left appears to be doing significantly better as compared to last evaluation. Fortunately there is no signs of active infection at this time. He has been tolerating the compression wrap without complication in  fact he made it the whole week at this point. He is showing signs of excellent improvement I am very happy in this regard. With that being said he is having some issues with infection we did review the results of his culture which I noted today. He did have a positive finding for Enterobacter as well as Alcaligenes faecalis. Fortunately the Levaquin that I placed him on will work for both which is great news. There is no signs of systemic infection at this point. 10/30; left posterior leg wound in the setting of very significant edema and what looks like chronic venous inflammation. He has compression pumps but does not use them. We have been using 3 layer compression. Silver alginate to the wound as the primary dressing 03/18/2019 on evaluation today patient appears to be doing a little better compared to last time I saw him. He really has not been using his compression pumps he tells me that he is having too much discomfort. He has been keeping his wraps on however. He is only been taking his fluid pills every other day because he states they are not really helping and he has [name] appointment with his primary care provider at the [facility] tomorrow. Subsequently the wound itself on the left lower extremity does seem to be greatly improved compared to previous. 03/25/2019 on evaluation today patient appears  to be doing better with regard to his wounds on the bilateral lower extremities. The left is doing excellent the right is also doing better although both still do show some signs of open wounds noted at this point unfortunately. Fortunately there is no signs of active infection at this time. The patient also is not really having any significant pain which is good news. Unfortunately there was some confusion with the referral on vascular disease and as far as getting the patient scheduled there can be contacting him later today to do this [name], [name] ([id number]) fortunately we got this straightened out. 04/01/2019 on evaluation today patient appears to be doing no fevers, chills, nausea, vomiting, or diarrhea. Excellent at this time with regard to his lower extremities. There does not appear to be any open wound at this point which is good news. Fortunately is also no signs of active infection at this time. Overall feel like the patient has done excellent with the compression the problem is every time we got him to this point and then subsequently go to using his own compression things just go right back to where they were. I am not sure how to address this we can try to get [name] appointment with vascular for 2 weeks now they have yet to call him. Obviously this has become frustrating for the patient as well. I think the issue has just been [name] honest error as far as scheduling is concerned but nonetheless still worn out the point where I am unsure of which direction we should take. 04/08/2019 on evaluation today patient actually appears to be doing well with regard to his lower extremities. There are no open wounds at this time and things seem to be managing quite nicely as far as the overall edema control is concerned. With that being said he does have his compression socks today for [place] to go ahead and reinitiate therapy in that manner at this point. He is going to be going for shoes to be  measured on Wednesday and then coincidentally he will also be seeing vascular on Thursday. Overall I think this is good news and  again I am hopeful that they will be able to do something for him to help prevent ongoing issues with edema control as well. No fevers, chills, nausea, vomiting, or diarrhea. 04/11/2019 on evaluation today patient actually appears to be doing poorly after just being discharged on Monday of this week. He had been experiencing issues with again blisters especially on the left lower extremity. With that being said he was completely healed and appeared to be doing great this past Monday. He then subsequently has new blisters that formed before his appointment with vascular this morning. He was also measured for shoes in the interim. With that being said we may have figured out what exactly is going on and why he continues to have issues like what we are seeing at this point. He takes his compression stockings off at nighttime and then he ends up having to sleep in his chair for 5-6 hours a night. He sleeps with his feet down he cannot really get him up in the recliner and therefore he is sleeping and the worst possible his position with his feet on the floor for that majority of the time. Again as I explained to him that is about one third at minimum at least one fourth of his day that he spending with his feet dangling down on the ground and the worst possible position they could be. I think this may be what is causing the issue. Subsequently I am leaning toward thinking that he may need a hospital bed in order to elevate his legs. We likely can have to coordinate this with his primary care provider at the [facility]. Readmission: 01/26/2021 this is a patient who presents for repeat evaluation here in the clinic although it is actually been couple of years since have seen him in fact it was December 2020 when I last saw him. Subsequently he never really healed but did end up  being lost to follow-up. He tells me has been having issues ongoing with his lower extremities has bilateral lower extremity lymphedema no real significant or definitive open wounds but in general his lymphedema is way out of control. We were never able to refer him to lymphedema clinic simply due to the fact to be honest we were never able to get him completely healed. I do not see anyone with open wounds. The patient does have evidence of type 2 diabetes mellitus, lymphedema, chronic venous insufficiency, and hypertension. That really has not changed since his last evaluation. Electronic Signature(s) Signed: 01/26/2021 6:19:10 PM By: Worthy Keeler PA-C Entered By: Worthy Keeler on 01/26/2021 18:19:10 [name] ([id number]) -------------------------------------------------------------------------------- Physical Exam Details Patient Name: NEYMAR, DOWE Date of Service: 01/26/2021 8:45 AM Medical Record Number: [id number] Patient Account Number: [id number] Date of Birth/Sex: [birth year] (70 y.o. M) Treating RN: [name] Primary Care Provider: Felipa Eth Other Clinician: Referring Provider: Eulogio Ditch Treating Provider/Extender: Skipper Cliche in Treatment: 0 Constitutional sitting or standing blood pressure is within target range for patient.. pulse regular and within target range for patient.Marland Kitchen respirations regular, non- labored and within target range for patient.Marland Kitchen temperature within target range for patient.. Obese and well-hydrated in no acute distress. Eyes conjunctiva clear no eyelid edema noted. pupils equal round and reactive to light and accommodation. Ears, Nose, Mouth, and Throat no gross abnormality of ear auricles or external auditory canals. normal hearing noted during conversation. mucus membranes moist. Respiratory normal breathing without difficulty. Cardiovascular 1+ dorsalis pedis/posterior tibialis pulses. Patient has bilateral stage III  lymphedema. Musculoskeletal normal gait and posture. no significant deformity or arthritic changes, no loss or range of motion, no clubbing. Psychiatric this patient is able to make decisions and demonstrates good insight into disease process. Alert and Oriented x 3. pleasant and cooperative. Notes Upon inspection again patient had no open wounds although he did have significant weeping of the bilateral lower extremities unfortunately. I do not see any signs of active infection systemically which is great news. No fevers, chills, nausea, vomiting, or diarrhea. I do think that he would benefit from a lymphedema clinic referral but to be perfectly honest right now I do not think that skin to be even possible. He has much too much weeping and we got a get this under control. Electronic Signature(s) Signed: 01/26/2021 6:20:11 PM By: Worthy Keeler PA-C Entered By: Worthy Keeler on 01/26/2021 18:20:10 [name] ([id number]) -------------------------------------------------------------------------------- Physician Orders Details Patient Name: [name] Date of Service: 01/26/2021 8:45 AM Medical Record Number: [id number] Patient Account Number: [id number] Date of Birth/Sex: [birth year] (70 y.o. M) Treating RN: [name] Primary Care Provider: Felipa Eth Other Clinician: Referring Provider: Eulogio Ditch Treating Provider/Extender: Skipper Cliche in Treatment: 0 Verbal / Phone Orders: No Diagnosis Coding ICD-10 Coding Code Description E11.622 Type 2 diabetes mellitus with other skin ulcer I89.0 Lymphedema, not elsewhere classified I87.2 Venous insufficiency (chronic) (peripheral) L97.822 Non-pressure chronic ulcer of other part of left lower leg with fat layer exposed L97.512 Non-pressure chronic ulcer of other part of right foot with fat layer exposed I10 Essential (primary) hypertension Follow-up Appointments o Return Appointment in 2 weeks. o Nurse Visit as  needed - next [place] for wound care. May utilize formulary equivalent dressing for wound treatment orders unless otherwise specified. Home Health Nurse may visit [facility] to address patientos wound care needs. Bathing/ Shower/ Hygiene o May shower; gently cleanse wound with antibacterial soap, rinse and pat dry prior to dressing wounds Edema Control - Lymphedema / Segmental Compressive Device / Other Bilateral Lower Extremities o Optional: One layer of unna paste to top of compression wrap (to act as [name] anchor). o 4 Layer Compression System Lymphedema. - bi lat, apply zetuvits/ or formulary for extra absorbant dressing around ankles and foot area, 2 times per week o Elevate, Exercise Daily and Avoid Standing for Long Periods of Time. o Elevate legs to the level of the heart and pump ankles as often as possible o Elevate leg(s) parallel to the floor when sitting. Off-Loading o Open toe surgical shoe Electronic Signature(s) Signed: 01/26/2021 6:22:10 PM By: Worthy Keeler PA-C Signed: 01/28/2021 1:17:18 PM By: [name] RN Entered By: [name] on 01/26/2021 11:45:14 [name] ([id number]) -------------------------------------------------------------------------------- Problem List Details Patient Name: [name] Date of Service: 01/26/2021 8:45 AM Medical Record Number: [id number] Patient Account Number: [id number] Date of Birth/Sex: 1951-03-16 (70 y.o. M) Treating RN: [name] Primary Care Provider: Felipa Eth Other Clinician: Referring Provider: Eulogio Ditch Treating Provider/Extender: Skipper Cliche in Treatment: 0 Active Problems ICD-10 Encounter Code Description Active Date MDM Diagnosis E11.622 Type 2 diabetes mellitus with other skin ulcer 01/26/2021 No Yes I89.0 Lymphedema, not elsewhere classified 01/26/2021 No Yes I87.2 Venous insufficiency (chronic) (peripheral) 01/26/2021 No Yes L97.822 Non-pressure  chronic ulcer of other part of left lower leg with fat layer 01/26/2021 No Yes exposed L97.512 Non-pressure chronic ulcer of other part of right foot with fat layer 01/26/2021 No Yes exposed [place] (primary) hypertension 01/26/2021 No Yes Inactive Problems Resolved Problems  Electronic Signature(s) Signed: 01/26/2021 9:12:35 AM By: Worthy Keeler PA-C Entered By: Worthy Keeler on 01/26/2021 09:12:35 [name] ([id number]) -------------------------------------------------------------------------------- Progress Note Details Patient Name: [name] Date of Service: 01/26/2021 8:45 AM Medical Record Number: [id number] Patient Account Number: [id number] Date of Birth/Sex: 1950-11-20 (70 y.o. M) Treating RN: [name] Primary Care Provider: Felipa Eth Other Clinician: Referring Provider: Eulogio Ditch Treating Provider/Extender: Skipper Cliche in Treatment: 0 Subjective Chief Complaint Information obtained from Patient Left LE ulcers History of Present Illness (HPI) 70 year old male who presented to the ER with bilateral lower extremity blisters which had started last week. he has a past medical history of leukemia, diabetes mellitus, hypertension, edema of both lower extremities, his recurrent skin infections, peripheral vascular disease, coronary artery disease, congestive heart failure and peripheral neuropathy. in the ER he was given Rocephin and put on Silvadene cream. he was put on oral doxycycline and was asked to follow-up with the [facility]. His last hemoglobin A1c was 6.6 in December and he checks his blood sugar once a week. He does not have any physicians outside the [place] system. He does not recall any vascular duplex studies done either for arterial or venous disease but was told to wear compression stockings which he does not use 05/30/2016 -- we have not yet received any of his notes from the [facility] hospital system and his arterial and venous duplex studies  are scheduled here in [place] around mid February. We are unable to have his insurance accepted by home health agencies and hence he is getting dressings only once a week. 06/06/16 -- -- I received a call from the patient's PCP at the [facility] at [facility] and spoke to Dr. Garvin Fila, phone number [phone] and fax number [phone]. She confirmed that no vascular testing was done over the last 5 years and she would be happy to do them if the patient did want them to be done at the [place] and we could fax him a request. Readmission: 69 year old male seen by as in February of this year and was referred to vein and vascular for studies and opinion from the vascular surgeons. The patient returns today with a fresh problem having had blisters on his left lower extremity which have been there for about 5 days and he clearly states that he has been wearing his compression stockings as advised though he could not read the moderate compression and has been wearing light compression. Review of his electronic medical records note that he had lower extremity arterial duplex examination done on 06/23/2016 which showed no hemodynamically significant stenosis in the bilateral lower extremity arterial system. He also had a lower extremity venous reflux examination done on 07/07/2016 and it was noted that he had venous incompetence in the right great saphenous vein and bilateral common femoral veins. Patient was seen by Dr. Tamala Julian on the same day and for some reason his notes do not reflect the venous studies or the arterial studies and he recommended patient do a venous duplex ultrasound to look for reflux and return to see him.he would also consider a lymph pump if required. The patient was told that his workup was normal and hence the patient canceled his follow-up appointment. 02/03/17 on evaluation today patient left medial lower extremity blister appears to be doing about the same. It is still continuing to  drain and there's still the blistered skin covering the wound bed which is making it difficult for the alternate to do its job. Fortunately there  is no evidence of cellulitis. No fevers chills noted. Patient states in general he is not having any significant discomfort. Patient's lower extremity arterial duplex exam revealed that patient was hemodynamically stable with no evidence of stenosis in regard to the bilateral lower extremities. The lower extremity venous reflux exam revealed the patient had venous incontinence noted in the right greater saphenous and bilateral common femoral vein. There is no evidence of deep or superficial vein thrombosis in the bilateral lower extremities. Readmission: 11/12/18 Patient presents for evaluation our clinic today concerning issues that he is having with his left lower extremity. He tells me that a couple weeks ago he began developing blisters on the left lower extremity along with increased swelling. He typically wears his compression stockings on a regular basis is previously been evaluated both here as well is with vascular surgery they would recommend lymphedema pumps but unfortunately that somehow fell through and he never heard anything back from that. Nonetheless I think lymphedema pumps would be beneficial for this patient. He does have a history of hypertension and diabetes. Obviously the chronic venous stasis and lymphedema as well. At this point the blisters have been given in more trouble he states sometimes when the blisters openings able to clean it down with alcohol and it will dry out and do well. Unfortunately that has not been the case this time. He is having some discomfort although this mean these with cleaning the areas he doesn't have discomfort just on a regular basis. He has not been able to wear his compression stockings since the blisters arose due to the fact that of course it will drain into the socks causing additional issues and he  didn't have any way to wrap this otherwise. He has increased to taking his Lasix every day instead of every other day. He sees his primary care provider later this month as well. No fevers, chills, nausea, or vomiting noted at this time. 11/19/18-Patient returns at 1 week, per intake RN the amount of seepage into the compression wraps was definitely improved, overall all the wounds are measuring smaller but continuing silver alginate to the wounds as primary dressing 11/26/18 on evaluation today patient appears to be doing quite well in regard to his left lower [place] ulcers. In fact of the areas that were noted initially he only has two regions still open. There is no evidence of active infection at this time. He still is not heard anything from the company regarding lymphedema pumps as of yet. Again as previously seen vascular they have not recommended any surgical intervention. [name], [name] ([id number]) 12/03/2018 on evaluation today patient actually appears to be doing quite well with regard to his lower extremity ulcers. In fact most of the areas appear to be healed the one spot which does not seem to be completely healed I am unsure of whether or not this is really draining that much but nonetheless there does not appear to be any signs of infection or significant drainage at this point. There is no sign of fever, chills, nausea, vomiting, or diarrhea. Overall I am pleased with how things have progressed I think is very close to being able to transition to his home compression stockings. 12/10/2018 upon evaluation today patient appears to be doing quite well with regard to his left lower extremity. He has been tolerating the dressing changes without complication. Fortunately there is no signs of active infection at this time. He appears after thorough evaluation of his leg to only have 1  small area that remains open at this point everything else appears to be almost completely closed. He still have  significant swelling of the left lower extremity. We had discussed discussing this with his primary care provider he is not able to see her in person they were at the [facility] and right now the [place] is not seeing patients on site. According to the patient anyway. Subsequently he did speak with her apparently and his primary care provider feels that he may likely have a DVT. With that being said she has not seen his leg she is just going off of his history. Nonetheless that is a concern that the patient now has as well and while I do not feel the DVT is likely we can definitely ensure that that is not the case I will go ahead and see about putting that order in today. Nonetheless otherwise I am in a recommend that we continue with the current wound care measures including the compression therapy most likely. We just need to ensure that his leg is indeed free of any DVTs. 12/17/2018 on evaluation today patient actually appears to be completely healed today. He does have 2 very small areas of blistering although this is not anything too significant at this point which is good news. With that being said I am in agreement with the fact that I think he is completely healed at this point. He does want to get back into his compression stocking. The good news is we have gotten approval from insurance for his lymphedema pumps we received a letter since last saw him last week. The other good news is his study did come back and showed no evidence of a DVT. 12/20/2018 on evaluation today patient presents for follow-up concerning his ongoing issues with his left lower extremity. He was actually discharged last Friday and did fairly well until he states blisters opened this morning. He tells me he has been wearing his compression stocking although he has a hard time getting this on. There does not appear to be any signs of active infection at this time. No fevers, chills, nausea, vomiting, or diarrhea. 12/27/2018 on  evaluation today patient appears to be doing very well with regard to his swelling of the left lower extremity the 4 layer compression wrap seems to have been beneficial for him. Fortunately there is no signs of active infection at this time. Patient has been tolerating the compression wrap without complication and his foot swelling in particular appears to be greatly improved. He does still have a wound on the lateral portion of his left leg I believe this is more of a blister that has now reopened. 01/03/2019 on evaluation today patient actually appears to be doing excellent in regard to his left lower extremity. He did receive his compression pumps and is actually use this 7 times since he was last here in the office. On top of the compression wrap he is now roughly 3 cm better at the calf and 2 cm better at the ankle he also states that his foot seem to go [name] issue better without even having to use a shoe horn. Obviously I think this is all evidence that he is doing excellent in this regard. The other good news is he does not appear to have anything open today as far as wounds are concerned. 01/15/2019 on evaluation today patient appears to be doing more poorly yet again with regard to his left lower extremity. He has developed new wounds  again after being discharged just recently. Unfortunately this continues to be the case that he will heal and then have subsequent new wounds. The last time I was hopeful that he may not end up coming back too quickly especially since he states he has been using his lymphedema pumps along with wearing his compression. Nonetheless he had a blister on the back of his leg that popped up on the left and this has opened up into [name] ulceration it is quite painful. 01/22/19 on evaluation today patient actually appears to be doing well with regard to his wound on the left lower extremity. He's been tolerating the dressing changes without complication including the compression  wrap in the wound appears to be significantly smaller today which is great news. Overall very pleased in this regard. 01/29/2019 on evaluation today patient appears to be doing well with regard to his left posterior lower extremity ulcer. He has been tolerating the dressing changes without complication. This is not completely healed but is getting much closer. We did order a Farrow wrap 4000 for him he has received this and has it with him today although I am not sure we are quite ready to start him on that as of yet. We are very close. 02/05/2019 on evaluation today patient actually appears to be doing quite well with regard to his left posterior lower extremity ulcer. He still has a very tiny opening remaining but the fortunate thing is he seems to be healing quite nicely. He also did get his Farrow wrap which I am hoping will help with his edema control as well at home. Fortunately there is no evidence of active infection. 02/12/2019 patient and fortunately appears to be doing poorly in regard to his wounds of the left lower extremity. He was very close to healing therefore we attempted to use his Velcro compression wraps continuing with lymphedema pumps at home. Unfortunately that does not seem to have done very well for him. He tells me that he wore them all the time but again I am not sure why if that is the case that he is having such significant edema. He is still on his fluid pills as well. With that being said there is no obvious sign of infection although I do wonder about the possibility of infection at this time as well. 02/19/2019 unfortunately upon evaluation today patient appears to be doing more poorly with regard to his left lower extremity. He is not showing signs of significant improvement and I think the biggest issue here is that he does have [name] infection that appears to likely be Pseudomonas. That is based on the blue-green drainage that were noted at this time. Unfortunately the  antibiotic that has been on is not going to take care of this at all. I think they will get a need to switch him to either Levaquin or Cipro and this was discussed with the patient. 02/26/2019 on evaluation today patient's lower extremity on the left appears to be doing significantly better as compared to last evaluation. Fortunately there is no signs of active infection at this time. He has been tolerating the compression wrap without complication in fact he made it the whole week at this point. He is showing signs of excellent improvement I am very happy in this regard. With that being said he is having some issues with infection we did review the results of his culture which I noted today. He did have a positive finding for Enterobacter as well as  Alcaligenes faecalis. Fortunately the Levaquin that I placed him on will work for both which is great news. There is no signs of systemic infection at this point. 10/30; left posterior leg wound in the setting of very significant edema and what looks like chronic venous inflammation. He has compression pumps but does not use them. We have been using 3 layer compression. Silver alginate to the wound as the primary dressing 03/18/2019 on evaluation today patient appears to be doing a little better compared to last time I saw him. He really has not been using his compression pumps he tells me that he is having too much discomfort. He has been keeping his wraps on however. He is only been taking his fluid pills every other day because he states they are not really helping and he has [name] appointment with his primary care provider at the [facility] tomorrow. Subsequently the wound itself on the left lower extremity does seem to be greatly improved compared to previous. [name], [name] ([id number]) 03/25/2019 on evaluation today patient appears to be doing better with regard to his wounds on the bilateral lower extremities. The left is doing excellent the right is also  doing better although both still do show some signs of open wounds noted at this point unfortunately. Fortunately there is no signs of active infection at this time. The patient also is not really having any significant pain which is good news. Unfortunately there was some confusion with the referral on vascular disease and as far as getting the patient scheduled there can be contacting him later today to do this fortunately we got this straightened out. 04/01/2019 on evaluation today patient appears to be doing no fevers, chills, nausea, vomiting, or diarrhea. Excellent at this time with regard to his lower extremities. There does not appear to be any open wound at this point which is good news. Fortunately is also no signs of active infection at this time. Overall feel like the patient has done excellent with the compression the problem is every time we got him to this point and then subsequently go to using his own compression things just go right back to where they were. I am not sure how to address this we can try to get [name] appointment with vascular for 2 weeks now they have yet to call him. Obviously this has become frustrating for the patient as well. I think the issue has just been [name] honest error as far as scheduling is concerned but nonetheless still worn out the point where I am unsure of which direction we should take. 04/08/2019 on evaluation today patient actually appears to be doing well with regard to his lower extremities. There are no open wounds at this time and things seem to be managing quite nicely as far as the overall edema control is concerned. With that being said he does have his compression socks today for [place] to go ahead and reinitiate therapy in that manner at this point. He is going to be going for shoes to be measured on Wednesday and then coincidentally he will also be seeing vascular on Thursday. Overall I think this is good news and again I am hopeful that they will  be able to do something for him to help prevent ongoing issues with edema control as well. No fevers, chills, nausea, vomiting, or diarrhea. 04/11/2019 on evaluation today patient actually appears to be doing poorly after just being discharged on Monday of this week. He had been experiencing issues with again  blisters especially on the left lower extremity. With that being said he was completely healed and appeared to be doing great this past Monday. He then subsequently has new blisters that formed before his appointment with vascular this morning. He was also measured for shoes in the interim. With that being said we may have figured out what exactly is going on and why he continues to have issues like what we are seeing at this point. He takes his compression stockings off at nighttime and then he ends up having to sleep in his chair for 5-6 hours a night. He sleeps with his feet down he cannot really get him up in the recliner and therefore he is sleeping and the worst possible his position with his feet on the floor for that majority of the time. Again as I explained to him that is about one third at minimum at least one fourth of his day that he spending with his feet dangling down on the ground and the worst possible position they could be. I think this may be what is causing the issue. Subsequently I am leaning toward thinking that he may need a hospital bed in order to elevate his legs. We likely can have to coordinate this with his primary care provider at the [facility]. Readmission: 01/26/2021 this is a patient who presents for repeat evaluation here in the clinic although it is actually been couple of years since have seen him in fact it was December 2020 when I last saw him. Subsequently he never really healed but did end up being lost to follow-up. He tells me has been having issues ongoing with his lower extremities has bilateral lower extremity lymphedema no real significant or  definitive open wounds but in general his lymphedema is way out of control. We were never able to refer him to lymphedema clinic simply due to the fact to be honest we were never able to get him completely healed. I do not see anyone with open wounds. The patient does have evidence of type 2 diabetes mellitus, lymphedema, chronic venous insufficiency, and hypertension. That really has not changed since his last evaluation. Patient History Information obtained from Patient. Allergies No Known Drug Allergies Family History Cancer - Mother, Stroke - Father, No family history of Diabetes, Heart Disease, Hereditary Spherocytosis, Hypertension, Kidney Disease, Lung Disease, Seizures, Thyroid Problems, Tuberculosis. Social History Former smoker - quit 5 years ago, Marital Status - Divorced, Alcohol Use - Never, Drug Use - No History, Caffeine Use - Daily. Medical History Cardiovascular Patient has history of Hypertension, Peripheral Venous Disease Denies history of Angina, Arrhythmia, Congestive Heart Failure, Coronary Artery Disease, Deep Vein Thrombosis, Hypotension, Myocardial Infarction, Peripheral Arterial Disease, Phlebitis, Vasculitis Endocrine Patient has history of Type II Diabetes Integumentary (Skin) Denies history of History of Burn, History of pressure wounds Neurologic Patient has history of Neuropathy Denies history of Dementia, Quadriplegia, Paraplegia, Seizure Disorder Oncologic Patient has history of Received Chemotherapy Denies history of Received Radiation Medical And Surgical History Notes 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 pulse regular and within target range for patient.Marland Kitchen respirations regular, non- labored and within target range for patient.Marland Kitchen temperature within target range for patient.. Obese and well-hydrated in no acute  distress. Vitals Time Taken: 8:57 AM, Height: 73 in, Source: Stated, Weight: 312 lbs, Source: Stated, BMI: 41.2, Temperature: 98.7 F, Pulse: 72 bpm, Respiratory Rate: 18 breaths/min, Blood Pressure:  128/78 mmHg. Eyes conjunctiva clear no eyelid edema noted. pupils equal round and reactive to light and accommodation. Ears, Nose, Mouth, and Throat no gross abnormality of ear auricles or external auditory canals. normal hearing noted during conversation. mucus membranes moist. Respiratory normal breathing without difficulty. Cardiovascular 1+ dorsalis pedis/posterior tibialis pulses. Patient has bilateral stage III lymphedema. Musculoskeletal normal gait and posture. no significant deformity or arthritic changes, no loss or range of motion, no clubbing. Psychiatric this patient is able to make decisions and demonstrates good insight into disease process. Alert and Oriented x 3. pleasant and cooperative. General Notes: Upon inspection again patient had no open wounds although he did have significant weeping of the bilateral lower extremities unfortunately. I do not see any signs of active infection systemically which is great news. No fevers, chills, nausea, vomiting, or diarrhea. I do think that he would benefit from a lymphedema clinic referral but to be perfectly honest right now I do not think that skin to be even possible. He has much too much weeping and we got a get this under control. Other Condition(s) Patient presents with Lymphedema located on the Left Leg. Patient presents with Lymphedema located on the Right Leg. 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 Post procedure Diagnosis Wound #: Same as Pre-Procedure [name], [name] ([id number]) There was a Four Layer Compression Therapy Procedure by [name], RN. Post procedure Diagnosis Wound #: Same as Pre-Procedure Plan Follow-up Appointments: Return Appointment in 2 weeks. Nurse Visit as needed - next [place]: [facility] for wound care. May utilize formulary equivalent dressing for wound treatment orders unless otherwise specified. Home Health Nurse may visit PRN to address patient s wound care needs. Bathing/ Shower/ Hygiene: May shower; gently cleanse wound with antibacterial soap, rinse and pat dry prior to dressing wounds Edema Control - Lymphedema / Segmental Compressive Device / Other: Optional: One layer of unna paste to top of compression wrap (to act as [name] anchor). 4 Layer Compression System Lymphedema. - bi lat, apply zetuvits/ or formulary for extra absorbant dressing around ankles and foot area, 2 times per week Elevate, Exercise Daily and Avoid Standing for Long Periods of Time. Elevate legs to the level of the heart and pump ankles as often as possible Elevate leg(s) parallel to the floor when sitting. Off-Loading: Open toe surgical shoe 1. Based on what I am seeing currently I do believe this patient would benefit from initiation of treatment with compression wrapping. I think that a 4-layer compression wrap bilaterally would be the ideal situation here. I discussed that with him today. With that being said he was somewhat concerned about the fact that he was not sure how this was going to work out especially with getting his shoes on. And then we had to put postop shoes on him but nonetheless the wraps are absolutely necessary. 2. I am also can recommend based on what I am seeing currently that we have the patient continue to elevate his legs much as  possible this is good to be of utmost importance. 3. I do think a referral to lymphedema clinic would be ideal but we first have to get him  healed before he came in and consider that. We will see patient back for reevaluation in 1 week here in the clinic. If anything worsens or changes patient will contact our office for additional recommendations. Electronic Signature(s) Signed: 01/26/2021 6:21:24 PM By: Worthy Keeler PA-C Entered By: Worthy Keeler on 01/26/2021 18:21:24 [name] ([id number]) -------------------------------------------------------------------------------- ROS/PFSH Details Patient Name: [name] Date of Service: 01/26/2021 8:45 AM Medical Record Number: [id number] Patient Account Number: [id number] Date of Birth/Sex: 23-Jun-1950 (70 y.o. M) Treating RN: [name] Primary Care Provider: Felipa Eth Other Clinician: Referring Provider: Eulogio Ditch Treating Provider/Extender: Skipper Cliche in Treatment: 0 Information Obtained From Patient Cardiovascular Medical History: Positive for: Hypertension; Peripheral Venous Disease Negative for: Angina; Arrhythmia; Congestive Heart Failure; Coronary Artery Disease; Deep Vein Thrombosis; Hypotension; Myocardial Infarction; Peripheral Arterial Disease; Phlebitis; Vasculitis Endocrine Medical History: Positive for: Type II Diabetes Time with diabetes: 66 Treated with: Oral agents Integumentary (Skin) Medical History: Negative for: History of Burn; History of pressure wounds Neurologic Medical History: Positive for: Neuropathy Negative for: Dementia; Quadriplegia; Paraplegia; Seizure Disorder Oncologic Medical History: Positive for: Received Chemotherapy Negative for: Received Radiation Past Medical History Notes: 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 Immunizations Pneumococcal Vaccine: Received Pneumococcal Vaccination: Yes Received Pneumococcal Vaccination On or After 60th Birthday:  Yes Immunization Notes: up to date Implantable Devices None Family and Social History Cancer: Yes - Mother; Diabetes: No; Heart Disease: No; Hereditary Spherocytosis: No; Hypertension: No; Kidney Disease: No; Lung Disease: No; Seizures: No; Stroke: Yes - Father; Thyroid Problems: No; Tuberculosis: No; Former smoker - quit 5 years ago; Marital Status - Divorced; Alcohol Use: Never; Drug Use: No History; Caffeine Use: Daily; Financial Concerns: No; Food, Clothing or Shelter Needs: No; Support System Lacking: No; Transportation Concerns: No Electronic Signature(s) Signed: 01/26/2021 6:22:10 PM By: Worthy Keeler PA-C Signed: 01/28/2021 1:17:18 PM By: [name] RN Entered By: [name] on 01/26/2021 09:09:03 [name], [name] ([id number]) [name], [name] ([id number]) -------------------------------------------------------------------------------- SuperBill Details Patient Name: [name] Date of Service: 01/26/2021 Medical Record Number: [id number] Patient Account Number: [id number] Date of Birth/Sex: May 22, 1950 (70 y.o. M) Treating RN: [name] Primary Care Provider: Felipa Eth Other Clinician: Referring Provider: Eulogio Ditch Treating Provider/Extender: Skipper Cliche in Treatment: 0 Diagnosis Coding ICD-10 Codes Code Description E11.622 Type 2 diabetes mellitus with other skin ulcer I89.0 Lymphedema, not elsewhere classified I87.2 Venous insufficiency (chronic) (peripheral) L97.822 Non-pressure chronic ulcer of other part of left lower leg with fat layer exposed L97.512 Non-pressure chronic ulcer of other part of right foot with fat layer exposed I10 Essential (primary) hypertension Facility Procedures CPT4: Description Modifier Quantity Code 97948016 99212 - WOUND CARE VISIT-LEV 2 EST PT 25 1 CPT4: 55374827 07867 BILATERAL: Application of multi-layer venous compression system; leg (below knee), including 1 ankle and foot. Physician Procedures CPT4 Code:  5449201 Description: 00712 - WC PHYS LEVEL 4 - EST PT Modifier: Quantity: 1 CPT4 Code: Description: ICD-10 Diagnosis Description E11.622 Type 2 diabetes mellitus with other skin ulcer I89.0 Lymphedema, not elsewhere classified I87.2 Venous insufficiency (chronic) (peripheral) L97.822 Non-pressure chronic ulcer of other part of left lower  leg with fat lay Modifier: er exposed Quantity: Electronic Signature(s) Signed: 01/26/2021 6:21:49 PM By: Worthy Keeler PA-C Previous Signature: 01/26/2021 11:39:07 AM Version By: [name] RN Entered By: Worthy Keeler on 01/26/2021 18:21:48

## 2021-01-28 NOTE — Progress Notes (Signed)
[name], SHOCK ([id number]) Visit Report for 01/26/2021 Allergy List Details Patient Name: [name], [name] Date of Service: 01/26/2021 8:45 AM Medical Record Number: [id number] Patient Account Number: [id number] Date of Birth/Sex: [birth year] (70 y.o. M) Treating RN: [name] Primary Care Camilia Caywood: Felipa Eth Other Clinician: Referring Nakeshia Waldeck: Eulogio Ditch Treating Laela Deviney/Extender: Jeri Cos Weeks in Treatment: 0 Allergies Active Allergies No Known Drug Allergies Allergy Notes Electronic Signature(s) Signed: 01/28/2021 1:17:18 PM By: [name] RN Entered By: [name] on 01/26/2021 08:58:33 [name] ([id number]) -------------------------------------------------------------------------------- Arrival Information Details Patient Name: [name] Date of Service: 01/26/2021 8:45 AM Medical Record Number: [id number] Patient Account Number: [id number] Date of Birth/Sex: 01-17-1951 (70 y.o. M) Treating RN: [name] Primary Care Gunda Maqueda: Felipa Eth Other Clinician: Referring Delaynie Stetzer: Eulogio Ditch Treating Kida Digiulio/Extender: Skipper Cliche in Treatment: 0 Visit Information Patient Arrived: Cane Arrival Time: 08:56 Accompanied By: self Transfer Assistance: None Patient Identification Verified: Yes Secondary Verification Process Completed: Yes Patient Requires Transmission-Based Precautions: No Patient Has Alerts: No History Since Last Visit All ordered tests and consults were completed: No Added or deleted any medications: No Any new allergies or adverse reactions: No Had a fall or experienced change in activities of daily living that may affect risk of falls: No Signs or symptoms of abuse/neglect since last visito No Hospitalized since last visit: No Implantable device outside of the clinic excluding cellular tissue based products placed in the center since last visit: No Has Dressing in Place as Prescribed: Yes Has Compression in Place as  Prescribed: Yes Electronic Signature(s) Signed: 01/28/2021 1:17:18 PM By: [name] RN Entered By: [name] on 01/26/2021 08:56:51 [name] ([id number]) -------------------------------------------------------------------------------- Clinic Level of Care Assessment Details Patient Name: [name] Date of Service: 01/26/2021 8:45 AM Medical Record Number: [id number] Patient Account Number: [id number] Date of Birth/Sex: 04-Feb-1951 (70 y.o. M) Treating RN: [name] Primary Care Jordin Dambrosio: Felipa Eth Other Clinician: Referring Talayeh Bruinsma: Eulogio Ditch Treating Janeli Lewison/Extender: Skipper Cliche in Treatment: 0 Clinic Level of Care Assessment Items TOOL 1 Quantity Score X - Use when EandM and Procedure is performed on INITIAL visit 1 0 ASSESSMENTS - Nursing Assessment / Reassessment X - General Physical Exam (combine w/ comprehensive assessment (listed just below) when performed on new 1 20 pt. evals) X- 1 25 Comprehensive Assessment (HX, ROS, Risk Assessments, Wounds Hx, etc.) ASSESSMENTS - Wound and Skin Assessment / Reassessment []  - Dermatologic / Skin Assessment (not related to wound area) 0 ASSESSMENTS - Ostomy and/or Continence Assessment and Care []  - Incontinence Assessment and Management 0 []  - 0 Ostomy Care Assessment and Management (repouching, etc.) PROCESS - Coordination of Care X - Simple Patient / Family Education for ongoing care 1 15 []  - 0 Complex (extensive) Patient / Family Education for ongoing care []  - 0 Staff obtains Programmer, systems, Records, Test Results / Process Orders []  - 0 Staff telephones HHA, Nursing Homes / Clarify orders / etc []  - 0 Routine Transfer to another Facility (non-emergent condition) []  - 0 Routine Hospital Admission (non-emergent condition) X- 1 15 New Admissions / Biomedical engineer / Ordering NPWT, Apligraf, etc. []  - 0 Emergency Hospital Admission (emergent condition) PROCESS - Special Needs []  -  Pediatric / Minor Patient Management 0 []  - 0 Isolation Patient Management []  - 0 Hearing / Language / Visual special needs []  - 0 Assessment of Community assistance (transportation, D/C planning, etc.) []  - 0 Additional assistance / Altered mentation []  - 0 Support Surface(s) Assessment (bed, cushion, seat, etc.) INTERVENTIONS - Miscellaneous []  -  External ear exam 0 []  - 0 Patient Transfer (multiple staff / Civil Service fast streamer / Similar devices) []  - 0 Simple Staple / Suture removal (25 or less) []  - 0 Complex Staple / Suture removal (26 or more) []  - 0 Hypo/Hyperglycemic Management (do not check if billed separately) []  - 0 Ankle / Brachial Index (ABI) - do not check if billed separately Has the patient been seen at the hospital within the last three years: Yes Total Score: 75 Level Of Care: New/Established - Level 2 ZUBIN, PONTILLO ([id number]) Electronic Signature(s) Signed: 01/28/2021 1:17:18 PM By: [name] RN Entered By: [name] on 01/26/2021 11:38:27 [name] ([id number]) -------------------------------------------------------------------------------- Compression Therapy Details Patient Name: [name] Date of Service: 01/26/2021 8:45 AM Medical Record Number: [id number] Patient Account Number: [id number] Date of Birth/Sex: [birth year] (70 y.o. M) Treating RN: [name] Primary Care Aimee Timmons: Felipa Eth Other Clinician: Referring Quana Chamberlain: Eulogio Ditch Treating Nathian Stencil/Extender: Skipper Cliche in Treatment: 0 Compression Therapy Performed for Wound Assessment: NonWound Condition Lymphedema - Left Leg Performed By: Clinician [name], RN Compression Type: Four Layer Post Procedure Diagnosis Same as Pre-procedure Electronic Signature(s) Signed: 01/28/2021 1:17:18 PM By: [name] RN Entered By: [name] on 01/26/2021 09:40:57 [name]  ([id number]) -------------------------------------------------------------------------------- Compression Therapy Details Patient Name: [name] Date of Service: 01/26/2021 8:45 AM Medical Record Number: [id number] Patient Account Number: [id number] Date of Birth/Sex: February 25, 1951 (70 y.o. M) Treating RN: [name] Primary Care Indianna Boran: Felipa Eth Other Clinician: Referring Hilliary Jock: Eulogio Ditch Treating Tanaja Ganger/Extender: Skipper Cliche in Treatment: 0 Compression Therapy Performed for Wound Assessment: NonWound Condition Lymphedema - Right Leg Performed By: Clinician [name], RN Compression Type: Four Layer Post Procedure Diagnosis Same as Pre-procedure Electronic Signature(s) Signed: 01/28/2021 1:17:18 PM By: [name] RN Entered By: [name] on 01/26/2021 [place] [name] ([id number]) -------------------------------------------------------------------------------- Encounter Discharge Information Details Patient Name: [name] Date of Service: 01/26/2021 8:45 AM Medical Record Number: [id number] Patient Account Number: [id number] Date of Birth/Sex: 10-27-1950 (70 y.o. M) Treating RN: [name] Primary Care Illa Enlow: Felipa Eth Other Clinician: Referring Caliope Ruppert: Eulogio Ditch Treating Elanore Talcott/Extender: Skipper Cliche in Treatment: 0 Encounter Discharge Information Items Discharge Condition: Stable Ambulatory Status: Cane Discharge Destination: Home Transportation: Private Auto Accompanied By: self Schedule Follow-up Appointment: Yes Clinical Summary of Care: Patient Declined Electronic Signature(s) Signed: 01/26/2021 11:40:28 AM By: [name] RN Entered By: [name] on 01/26/2021 11:40:27 [name] ([id number]) -------------------------------------------------------------------------------- Lower Extremity Assessment Details Patient Name: [name] Date of Service: 01/26/2021 8:45 AM Medical Record Number:  [id number] Patient Account Number: [id number] Date of Birth/Sex: 07-Jun-1950 (70 y.o. M) Treating RN: [name] Primary Care Lopaka Karge: Felipa Eth Other Clinician: Referring Monita Swier: Eulogio Ditch Treating Yaritsa Savarino/Extender: Skipper Cliche in Treatment: 0 Edema Assessment Assessed: [Left: No] [Right: No] Edema: [Left: Yes] [Right: Yes] Calf Left: Right: Point of Measurement: 35 cm From Medial Instep 44 cm 40 cm Ankle Left: Right: Point of Measurement: 10 cm From Medial Instep 38 cm 36 cm Knee To Floor Left: Right: From Medial Instep 45 cm 45 cm Electronic Signature(s) Signed: 01/28/2021 1:17:18 PM By: [name] RN Entered By: [name] on 01/26/2021 09:24:48 [name] ([id number]) -------------------------------------------------------------------------------- Multi Wound Chart Details Patient Name: [name] Date of Service: 01/26/2021 8:45 AM Medical Record Number: [id number] Patient Account Number: [id number] Date of Birth/Sex: 08-29-1950 (70 y.o. M) Treating RN: [name] Primary Care Laine Fonner: Felipa Eth Other Clinician: Referring Elis Rawlinson: Eulogio Ditch Treating Srah Ake/Extender: Skipper Cliche in Treatment: 0 Vital Signs Height(in): 76  Pulse(bpm): 72 Weight(lbs): 312 Blood Pressure(mmHg): 128/78 Body Mass Index(BMI): 41 Temperature(F): 98.7 Respiratory Rate(breaths/min): 18 Wound Assessments Treatment Notes Electronic Signature(s) Signed: 01/28/2021 1:17:18 PM By: [name] RN Entered By: [name] on 01/26/2021 09:40:21 [name] ([id number]) -------------------------------------------------------------------------------- [place] Details Patient Name: [name] Date of Service: 01/26/2021 8:45 AM Medical Record Number: [id number] Patient Account Number: [id number] Date of Birth/Sex: 1950-07-02 (70 y.o. M) Treating RN: [name] Primary Care Maribelle Hopple: Felipa Eth Other  Clinician: Referring Joellyn Grandt: Eulogio Ditch Treating Devon Pretty/Extender: Skipper Cliche in Treatment: 0 Active Inactive Wound/Skin Impairment Nursing Diagnoses: Knowledge deficit related to ulceration/compromised skin integrity Goals: Patient/caregiver will verbalize understanding of skin care regimen Date Initiated: 01/26/2021 Target Resolution Date: 02/25/2021 Goal Status: Active Interventions: Assess patient/caregiver ability to obtain necessary supplies Assess patient/caregiver ability to perform ulcer/skin care regimen upon admission and as needed Assess ulceration(s) every visit Notes: Electronic Signature(s) Signed: 01/28/2021 1:17:18 PM By: [name] RN Entered By: [name] on 01/26/2021 09:40:07 [name] ([id number]) -------------------------------------------------------------------------------- Non-Wound Condition Assessment Details Patient Name: [name] Date of Service: 01/26/2021 8:45 AM Medical Record Number: [id number] Patient Account Number: [id number] Date of Birth/Sex: 02-10-1951 (70 y.o. M) Treating RN: [name] Primary Care Deairra Halleck: Felipa Eth Other Clinician: Referring Roshawnda Pecora: Eulogio Ditch Treating Triva Hueber/Extender: Skipper Cliche in Treatment: 0 Non-Wound Condition: Condition: Lymphedema Location: Leg Side: Left Notes: Anterior aspect of lower leg Photos Electronic Signature(s) Signed: 01/28/2021 1:17:18 PM By: [name] RN Entered By: [name] on 01/26/2021 09:23:27 [name] ([id number]) -------------------------------------------------------------------------------- Non-Wound Condition Assessment Details Patient Name: [name] Date of Service: 01/26/2021 8:45 AM Medical Record Number: [id number] Patient Account Number: [id number] Date of Birth/Sex: [birth year] (70 y.o. M) Treating RN: [name] Primary Care Nataley Bahri: Felipa Eth Other Clinician: Referring Aftyn Nott: Eulogio Ditch Treating  Ashely Joshua/Extender: Jeri Cos Weeks in Treatment: 0 Non-Wound Condition: Condition: Lymphedema Location: Leg Side: Right Electronic Signature(s) Signed: 01/28/2021 1:17:18 PM By: [name] RN Entered By: [name] on 01/26/2021 09:23:41 [name] ([id number]) -------------------------------------------------------------------------------- Pain Assessment Details Patient Name: [name] Date of Service: 01/26/2021 8:45 AM Medical Record Number: [id number] Patient Account Number: [id number] Date of Birth/Sex: [birth year] (70 y.o. M) Treating RN: [name] Primary Care Deshana Rominger: Felipa Eth Other Clinician: Referring Roshad Hack: Eulogio Ditch Treating Amyra Vantuyl/Extender: Skipper Cliche in Treatment: 0 Active Problems Location of Pain Severity and Description of Pain Patient Has Paino No Site Locations Pain Management and Medication Current Pain Management: Electronic Signature(s) Signed: 01/28/2021 1:17:18 PM By: [name] RN Entered By: [name] on 01/26/2021 08:57:32 [name] ([id number]) -------------------------------------------------------------------------------- Patient/Caregiver Education Details Patient Name: [name] Date of Service: 01/26/2021 8:45 AM Medical Record Number: [id number] Patient Account Number: [id number] Date of Birth/Gender: [birth year] (70 y.o. M) Treating RN: [name] Primary Care Physician: Felipa Eth Other Clinician: Referring Physician: Eulogio Ditch Treating Physician/Extender: Skipper Cliche in Treatment: 0 Education Assessment Education Provided To: Patient Education Topics Provided Wound/Skin Impairment: Methods: Explain/Verbal Responses: State content correctly Electronic Signature(s) Signed: 01/28/2021 1:17:18 PM By: [name] RN Entered By: [name] on 01/26/2021 11:39:17 [name]  ([id number]) -------------------------------------------------------------------------------- Vitals Details Patient Name: [name] Date of Service: 01/26/2021 8:45 AM Medical Record Number: [id number] Patient Account Number: [id number] Date of Birth/Sex: [birth year] (70 y.o. M) Treating RN: [name] Primary Care Devona Holmes: Felipa Eth Other Clinician: Referring Netha Dafoe: Eulogio Ditch Treating Anneka Studer/Extender: Skipper Cliche in Treatment: 0 Vital Signs Time Taken: 08:57 Temperature (F): 98.7 Height (in): 73 Pulse (bpm): 72 Source: Stated Respiratory Rate (breaths/min): 18 Weight (lbs):  312 Blood Pressure (mmHg): 128/78 Source: Stated Reference Range: 80 - 120 mg / dl Body Mass Index (BMI): 41.2 Electronic Signature(s) Signed: 01/28/2021 1:17:18 PM By: [name] RN Entered By: [name] on 01/26/2021 08:58:09

## 2021-02-02 ENCOUNTER — Other Ambulatory Visit: Payer: Self-pay

## 2021-02-02 DIAGNOSIS — E11622 Type 2 diabetes mellitus with other skin ulcer: Secondary | ICD-10-CM | POA: Diagnosis not present

## 2021-02-02 NOTE — Progress Notes (Signed)
[name], [name] ([id number]) Visit Report for 02/02/2021 Arrival Information Details Patient Name: [name], [name] Date of Service: 02/02/2021 8:30 AM Medical Record Number: [id number] Patient Account Number: [id number] Date of Birth/Sex: January 15, 1951 (70 y.o. M) Treating RN: [name] Primary Care Soriya Worster: Felipa Eth Other Clinician: Referring Marzell Isakson: Felipa Eth Treating Jarika Robben/Extender: Skipper Cliche in Treatment: 1 Visit Information History Since Last Visit All ordered tests and consults were completed: No Patient Arrived: [name] Added or deleted any medications: No Arrival Time: 08:33 Any new allergies or adverse reactions: No Accompanied By: self Had a fall or experienced change in No Transfer Assistance: None activities of daily living that may affect Patient Identification Verified: Yes risk of falls: Secondary Verification Process Completed: Yes Signs or symptoms of abuse/neglect since last visito No Patient Requires Transmission-Based Precautions: No Hospitalized since last visit: No Patient Has Alerts: No Implantable device outside of the clinic excluding No cellular tissue based products placed in the center since last visit: Has Dressing in Place as Prescribed: Yes Has Compression in Place as Prescribed: No Pain Present Now: No Electronic Signature(s) Signed: 02/02/2021 11:11:36 AM By: [name] RN Entered By: [name] on 02/02/2021 09:02:30 [name] ([id number]) -------------------------------------------------------------------------------- Clinic Level of Care Assessment Details Patient Name: [name] Date of Service: 02/02/2021 8:30 AM Medical Record Number: [id number] Patient Account Number: [id number] Date of Birth/Sex: 03-22-1951 (70 y.o. M) Treating RN: [name] Primary Care [name]: Felipa Eth Other Clinician: Referring [name]: Felipa Eth Treating [name]/Extender: Skipper Cliche in Treatment:  1 Clinic Level of Care Assessment Items TOOL 1 Quantity Score []  - Use when EandM and Procedure is performed on INITIAL visit 0 ASSESSMENTS - Nursing Assessment / Reassessment []  - General Physical Exam (combine w/ comprehensive assessment (listed just below) when performed on new 0 pt. evals) []  - 0 Comprehensive Assessment (HX, ROS, Risk Assessments, Wounds Hx, etc.) ASSESSMENTS - Wound and Skin Assessment / Reassessment []  - Dermatologic / Skin Assessment (not related to wound area) 0 ASSESSMENTS - Ostomy and/or Continence Assessment and Care []  - Incontinence Assessment and Management 0 []  - 0 Ostomy Care Assessment and Management (repouching, etc.) PROCESS - Coordination of Care []  - Simple Patient / Family Education for ongoing care 0 []  - 0 Complex (extensive) Patient / Family Education for ongoing care []  - 0 Staff obtains Programmer, systems, Records, Test Results / Process Orders []  - 0 Staff telephones HHA, Nursing Homes / Clarify orders / etc []  - 0 Routine Transfer to another Facility (non-emergent condition) []  - 0 Routine Hospital Admission (non-emergent condition) []  - 0 New Admissions / Biomedical engineer / Ordering NPWT, Apligraf, etc. []  - 0 Emergency Hospital Admission (emergent condition) PROCESS - Special Needs []  - Pediatric / Minor Patient Management 0 []  - 0 Isolation Patient Management []  - 0 Hearing / Language / Visual special needs []  - 0 Assessment of Community assistance (transportation, D/C planning, etc.) []  - 0 Additional assistance / Altered mentation []  - 0 Support Surface(s) Assessment (bed, cushion, seat, etc.) INTERVENTIONS - Miscellaneous []  - External ear exam 0 []  - 0 Patient Transfer (multiple staff / Civil Service fast streamer / Similar devices) []  - 0 Simple Staple / Suture removal (25 or less) []  - 0 Complex Staple / Suture removal (26 or more) []  - 0 Hypo/Hyperglycemic Management (do not check if billed separately) []  - 0 Ankle /  Brachial Index (ABI) - do not check if billed separately Has the patient been seen at the hospital within the last three years: Yes Total  Score: 0 Level Of Care: ____ [name] ([id number]) Electronic Signature(s) Signed: 02/02/2021 11:11:36 AM By: [name] RN Entered By: [name] on 02/02/2021 09:05:36 [name] ([id number]) -------------------------------------------------------------------------------- Compression Therapy Details Patient Name: [name] Date of Service: 02/02/2021 8:30 AM Medical Record Number: [id number] Patient Account Number: [id number] Date of Birth/Sex: November 23, 1950 (70 y.o. M) Treating RN: [name] Primary Care Halo Laski: Felipa Eth Other Clinician: Referring Marayah Higdon: Felipa Eth Treating [name]/Extender: Skipper Cliche in Treatment: 1 Compression Therapy Performed for Wound Assessment: NonWound Condition Lymphedema - Left Leg Performed By: Clinician [name], RN Compression Type: Four Layer Electronic Signature(s) Signed: 02/02/2021 11:11:36 AM By: [name] RN Entered By: [name] on 02/02/2021 09:03:24 [name] ([id number]) -------------------------------------------------------------------------------- Compression Therapy Details Patient Name: [name] Date of Service: 02/02/2021 8:30 AM Medical Record Number: [id number] Patient Account Number: [id number] Date of Birth/Sex: Sep 05, 1950 (70 y.o. M) Treating RN: [name] Primary Care [name]: Felipa Eth Other Clinician: Referring Zackaria Burkey: Felipa Eth Treating Emre Stock/Extender: Skipper Cliche in Treatment: 1 Compression Therapy Performed for Wound Assessment: NonWound Condition Lymphedema - Right Leg Performed By: Clinician [name], RN Compression Type: Four Layer Electronic Signature(s) Signed: 02/02/2021 11:11:36 AM By: [name] RN Entered By: [name] on 02/02/2021 09:03:39 [name]  ([id number]) -------------------------------------------------------------------------------- Encounter Discharge Information Details Patient Name: [name] Date of Service: 02/02/2021 8:30 AM Medical Record Number: [id number] Patient Account Number: [id number] Date of Birth/Sex: [birth year] (70 y.o. M) Treating RN: [name] Primary Care Juliany Daughety: Felipa Eth Other Clinician: Referring Syvilla Martin: Felipa Eth Treating Leonie Amacher/Extender: Skipper Cliche in Treatment: 1 Encounter Discharge Information Items Discharge Condition: Stable Ambulatory Status: Cane Discharge Destination: Home Transportation: Private Auto Accompanied By: self Schedule Follow-up Appointment: Yes Clinical Summary of Care: Patient Declined Electronic Signature(s) Signed: 02/02/2021 11:11:36 AM By: [name] RN Entered By: [name] on 02/02/2021 09:05:17

## 2021-02-09 ENCOUNTER — Other Ambulatory Visit: Payer: Self-pay

## 2021-02-09 ENCOUNTER — Encounter: Payer: PPO | Attending: Physician Assistant | Admitting: Physician Assistant

## 2021-02-09 DIAGNOSIS — I872 Venous insufficiency (chronic) (peripheral): Secondary | ICD-10-CM | POA: Insufficient documentation

## 2021-02-09 DIAGNOSIS — I509 Heart failure, unspecified: Secondary | ICD-10-CM | POA: Diagnosis not present

## 2021-02-09 DIAGNOSIS — I890 Lymphedema, not elsewhere classified: Secondary | ICD-10-CM | POA: Insufficient documentation

## 2021-02-09 DIAGNOSIS — L97512 Non-pressure chronic ulcer of other part of right foot with fat layer exposed: Secondary | ICD-10-CM | POA: Insufficient documentation

## 2021-02-09 DIAGNOSIS — E1142 Type 2 diabetes mellitus with diabetic polyneuropathy: Secondary | ICD-10-CM | POA: Diagnosis not present

## 2021-02-09 DIAGNOSIS — E11622 Type 2 diabetes mellitus with other skin ulcer: Secondary | ICD-10-CM | POA: Insufficient documentation

## 2021-02-09 DIAGNOSIS — I110 Hypertensive heart disease with heart failure: Secondary | ICD-10-CM | POA: Insufficient documentation

## 2021-02-09 DIAGNOSIS — L97822 Non-pressure chronic ulcer of other part of left lower leg with fat layer exposed: Secondary | ICD-10-CM | POA: Insufficient documentation

## 2021-02-09 DIAGNOSIS — E1151 Type 2 diabetes mellitus with diabetic peripheral angiopathy without gangrene: Secondary | ICD-10-CM | POA: Insufficient documentation

## 2021-02-09 DIAGNOSIS — L97819 Non-pressure chronic ulcer of other part of right lower leg with unspecified severity: Secondary | ICD-10-CM | POA: Diagnosis not present

## 2021-02-09 DIAGNOSIS — Z856 Personal history of leukemia: Secondary | ICD-10-CM | POA: Diagnosis not present

## 2021-02-09 DIAGNOSIS — L97829 Non-pressure chronic ulcer of other part of left lower leg with unspecified severity: Secondary | ICD-10-CM | POA: Diagnosis not present

## 2021-02-12 NOTE — Progress Notes (Signed)
SEVRIN, SALLY ([id number]) Visit Report for 02/09/2021 Chief Complaint Document Details Patient Name: [name], [name] Date of Service: 02/09/2021 8:00 AM Medical Record Number: [id number] Patient Account Number: [id number] Date of Birth/Sex: 08-26-1950 (70 y.o. M) Treating RN: [name] Primary Care Provider: Felipa Eth Other Clinician: Referring Provider: Felipa Eth Treating Provider/Extender: Skipper Cliche in Treatment: 2 Information Obtained from: Patient Chief Complaint Left LE ulcers Electronic Signature(s) Signed: 02/09/2021 5:40:02 PM By: Worthy Keeler PA-C Entered By: Worthy Keeler on 02/09/2021 08:21:41 [name] ([id number]) -------------------------------------------------------------------------------- HPI Details Patient Name: [name] Date of Service: 02/09/2021 8:00 AM Medical Record Number: [id number] Patient Account Number: [id number] Date of Birth/Sex: [birth year] (69 y.o. M) Treating RN: [name] Primary Care Provider: Felipa Eth Other Clinician: Referring Provider: Felipa Eth Treating Provider/Extender: Skipper Cliche in Treatment: 2 History of Present Illness HPI Description: 70 year old male who presented to the ER with bilateral lower extremity blisters which had started last week. he has a past medical history of leukemia, diabetes mellitus, hypertension, edema of both lower extremities, his recurrent skin infections, peripheral vascular disease, coronary artery disease, congestive heart failure and peripheral neuropathy. in the ER he was given Rocephin and put on Silvadene cream. he was put on oral doxycycline and was asked to follow-up with the [facility]. His last hemoglobin A1c was 6.6 in December and he checks his blood sugar once a week. He does not have any physicians outside the [place] system. He does not recall any vascular duplex studies done either for arterial or venous disease but was told to wear compression  stockings which he does not use 05/30/2016 -- we have not yet received any of his notes from the [facility] hospital system and his arterial and venous duplex studies are scheduled here in [place] around mid February. We are unable to have his insurance accepted by home health agencies and hence he is getting dressings only once a week. 06/06/16 -- -- I received a call from the patient's PCP at the [facility] and spoke to Dr. Garvin Fila, phone number [phone] and fax number [phone]. She confirmed that no vascular testing was done over the last 5 years and she would be happy to do them if the patient did want them to be done at the [place] and we could fax him a request. Readmission: 70 year old male seen by as in February of this year and was referred to vein and vascular for studies and opinion from the vascular surgeons. The patient returns today with a fresh problem having had blisters on his left lower extremity which have been there for about 5 days and he clearly states that he has been wearing his compression stockings as advised though he could not read the moderate compression and has been wearing light compression. Review of his electronic medical records note that he had lower extremity arterial duplex examination done on 06/23/2016 which showed no hemodynamically significant stenosis in the bilateral lower extremity arterial system. He also had a lower extremity venous reflux examination done on 07/07/2016 and it was noted that he had venous incompetence in the right great saphenous vein and bilateral common femoral veins. Patient was seen by Dr. Tamala Julian on the same day and for some reason his notes do not reflect the venous studies or the arterial studies and he recommended patient do a venous duplex ultrasound to look for reflux and return to see him.he would also consider a lymph pump if required. The patient was told that his workup  was normal and hence the patient canceled his  follow-up appointment. 02/03/17 on evaluation today patient left medial lower extremity blister appears to be doing about the same. It is still continuing to drain and there's still the blistered skin covering the wound bed which is making it difficult for the alternate to do its job. Fortunately there is no evidence of cellulitis. No fevers chills noted. Patient states in general he is not having any significant discomfort. Patient's lower extremity arterial duplex exam revealed that patient was hemodynamically stable with no evidence of stenosis in regard to the bilateral lower extremities. The lower extremity venous reflux exam revealed the patient had venous incontinence noted in the right greater saphenous and bilateral common femoral vein. There is no evidence of deep or superficial vein thrombosis in the bilateral lower extremities. Readmission: 11/12/18 Patient presents for evaluation our clinic today concerning issues that he is having with his left lower extremity. He tells me that a couple weeks ago he began developing blisters on the left lower extremity along with increased swelling. He typically wears his compression stockings on a regular basis is previously been evaluated both here as well is with vascular surgery they would recommend lymphedema pumps but unfortunately that somehow fell through and he never heard anything back from that. Nonetheless I think lymphedema pumps would be beneficial for this patient. He does have a history of hypertension and diabetes. Obviously the chronic venous stasis and lymphedema as well. At this point the blisters have been given in more trouble he states sometimes when the blisters openings able to clean it down with alcohol and it will dry out and do well. Unfortunately that has not been the case this time. He is having some discomfort although this mean these with cleaning the areas he doesn't have discomfort just on a regular basis. He has not been  able to wear his compression stockings since the blisters arose due to the fact that of course it will drain into the socks causing additional issues and he didn't have any way to wrap this otherwise. He has increased to taking his Lasix every day instead of every other day. He sees his primary care provider later this month as well. No fevers, chills, nausea, or vomiting noted at this time. 11/19/18-Patient returns at 1 week, per intake RN the amount of seepage into the compression wraps was definitely improved, overall all the wounds are measuring smaller but continuing silver alginate to the wounds as primary dressing 11/26/18 on evaluation today patient appears to be doing quite well in regard to his left lower Trinity ulcers. In fact of the areas that were noted initially he only has two regions still open. There is no evidence of active infection at this time. He still is not heard anything from the company regarding lymphedema pumps as of yet. Again as previously seen vascular they have not recommended any surgical intervention. 12/03/2018 on evaluation today patient actually appears to be doing quite well with regard to his lower extremity ulcers. In fact most of the areas appear to be healed the one spot which does not seem to be completely healed I am unsure of whether or not this is really draining that much but nonetheless there does not appear to be any signs of infection or significant drainage at this point. There is no sign of fever, chills, nausea, vomiting, or diarrhea. Overall I am pleased with how things have progressed I think is very close to being able to transition  to his home compression stockings. LIONAL, ICENOGLE ([id number]) 12/10/2018 upon evaluation today patient appears to be doing quite well with regard to his left lower extremity. He has been tolerating the dressing changes without complication. Fortunately there is no signs of active infection at this time. He appears after  thorough evaluation of his leg to only have 1 small area that remains open at this point everything else appears to be almost completely closed. He still have significant swelling of the left lower extremity. We had discussed discussing this with his primary care provider he is not able to see her in person they were at the [facility] and right now the [place] is not seeing patients on site. According to the patient anyway. Subsequently he did speak with her apparently and his primary care provider feels that he may likely have a DVT. With that being said she has not seen his leg she is just going off of his history. Nonetheless that is a concern that the patient now has as well and while I do not feel the DVT is likely we can definitely ensure that that is not the case I will go ahead and see about putting that order in today. Nonetheless otherwise I am in a recommend that we continue with the current wound care measures including the compression therapy most likely. We just need to ensure that his leg is indeed free of any DVTs. 12/17/2018 on evaluation today patient actually appears to be completely healed today. He does have 2 very small areas of blistering although this is not anything too significant at this point which is good news. With that being said I am in agreement with the fact that I think he is completely healed at this point. He does want to get back into his compression stocking. The good news is we have gotten approval from insurance for his lymphedema pumps we received a letter since last saw him last week. The other good news is his study did come back and showed no evidence of a DVT. 12/20/2018 on evaluation today patient presents for follow-up concerning his ongoing issues with his left lower extremity. He was actually discharged last Friday and did fairly well until he states blisters opened this morning. He tells me he has been wearing his compression stocking although he has a hard  time getting this on. There does not appear to be any signs of active infection at this time. No fevers, chills, nausea, vomiting, or diarrhea. 12/27/2018 on evaluation today patient appears to be doing very well with regard to his swelling of the left lower extremity the 4 layer compression wrap seems to have been beneficial for him. Fortunately there is no signs of active infection at this time. Patient has been tolerating the compression wrap without complication and his foot swelling in particular appears to be greatly improved. He does still have a wound on the lateral portion of his left leg I believe this is more of a blister that has now reopened. 01/03/2019 on evaluation today patient actually appears to be doing excellent in regard to his left lower extremity. He did receive his compression pumps and is actually use this 7 times since he was last here in the office. On top of the compression wrap he is now roughly 3 cm better at the calf and 2 cm better at the ankle he also states that his foot seem to go [name] issue better without even having to use a shoe horn. Obviously I  think this is all evidence that he is doing excellent in this regard. The other good news is he does not appear to have anything open today as far as wounds are concerned. 01/15/2019 on evaluation today patient appears to be doing more poorly yet again with regard to his left lower extremity. He has developed new wounds again after being discharged just recently. Unfortunately this continues to be the case that he will heal and then have subsequent new wounds. The last time I was hopeful that he may not end up coming back too quickly especially since he states he has been using his lymphedema pumps along with wearing his compression. Nonetheless he had a blister on the back of his leg that popped up on the left and this has opened up into [name] ulceration it is quite painful. 01/22/19 on evaluation today patient actually appears  to be doing well with regard to his wound on the left lower extremity. He's been tolerating the dressing changes without complication including the compression wrap in the wound appears to be significantly smaller today which is great news. Overall very pleased in this regard. 01/29/2019 on evaluation today patient appears to be doing well with regard to his left posterior lower extremity ulcer. He has been tolerating the dressing changes without complication. This is not completely healed but is getting much closer. We did order a Farrow wrap 4000 for him he has received this and has it with him today although I am not sure we are quite ready to start him on that as of yet. We are very close. 02/05/2019 on evaluation today patient actually appears to be doing quite well with regard to his left posterior lower extremity ulcer. He still has a very tiny opening remaining but the fortunate thing is he seems to be healing quite nicely. He also did get his Farrow wrap which I am hoping will help with his edema control as well at home. Fortunately there is no evidence of active infection. 02/12/2019 patient and fortunately appears to be doing poorly in regard to his wounds of the left lower extremity. He was very close to healing therefore we attempted to use his Velcro compression wraps continuing with lymphedema pumps at home. Unfortunately that does not seem to have done very well for him. He tells me that he wore them all the time but again I am not sure why if that is the case that he is having such significant edema. He is still on his fluid pills as well. With that being said there is no obvious sign of infection although I do wonder about the possibility of infection at this time as well. 02/19/2019 unfortunately upon evaluation today patient appears to be doing more poorly with regard to his left lower extremity. He is not showing signs of significant improvement and I think the biggest issue here is  that he does have [name] infection that appears to likely be Pseudomonas. That is based on the blue-green drainage that were noted at this time. Unfortunately the antibiotic that has been on is not going to take care of this at all. I think they will get a need to switch him to either Levaquin or Cipro and this was discussed with the patient. 02/26/2019 on evaluation today patient's lower extremity on the left appears to be doing significantly better as compared to last evaluation. Fortunately there is no signs of active infection at this time. He has been tolerating the compression wrap without complication in  fact he made it the whole week at this point. He is showing signs of excellent improvement I am very happy in this regard. With that being said he is having some issues with infection we did review the results of his culture which I noted today. He did have a positive finding for Enterobacter as well as Alcaligenes faecalis. Fortunately the Levaquin that I placed him on will work for both which is great news. There is no signs of systemic infection at this point. 10/30; left posterior leg wound in the setting of very significant edema and what looks like chronic venous inflammation. He has compression pumps but does not use them. We have been using 3 layer compression. Silver alginate to the wound as the primary dressing 03/18/2019 on evaluation today patient appears to be doing a little better compared to last time I saw him. He really has not been using his compression pumps he tells me that he is having too much discomfort. He has been keeping his wraps on however. He is only been taking his fluid pills every other day because he states they are not really helping and he has [name] appointment with his primary care provider at the [facility] tomorrow. Subsequently the wound itself on the left lower extremity does seem to be greatly improved compared to previous. 03/25/2019 on evaluation today patient appears  to be doing better with regard to his wounds on the bilateral lower extremities. The left is doing excellent the right is also doing better although both still do show some signs of open wounds noted at this point unfortunately. Fortunately there is no signs of active infection at this time. The patient also is not really having any significant pain which is good news. Unfortunately there was some confusion with the referral on vascular disease and as far as getting the patient scheduled there can be contacting him later today to do this [name], [name] ([id number]) fortunately we got this straightened out. 04/01/2019 on evaluation today patient appears to be doing no fevers, chills, nausea, vomiting, or diarrhea. Excellent at this time with regard to his lower extremities. There does not appear to be any open wound at this point which is good news. Fortunately is also no signs of active infection at this time. Overall feel like the patient has done excellent with the compression the problem is every time we got him to this point and then subsequently go to using his own compression things just go right back to where they were. I am not sure how to address this we can try to get [name] appointment with vascular for 2 weeks now they have yet to call him. Obviously this has become frustrating for the patient as well. I think the issue has just been [name] honest error as far as scheduling is concerned but nonetheless still worn out the point where I am unsure of which direction we should take. 04/08/2019 on evaluation today patient actually appears to be doing well with regard to his lower extremities. There are no open wounds at this time and things seem to be managing quite nicely as far as the overall edema control is concerned. With that being said he does have his compression socks today for [place] to go ahead and reinitiate therapy in that manner at this point. He is going to be going for shoes to be  measured on Wednesday and then coincidentally he will also be seeing vascular on Thursday. Overall I think this is good news and  again I am hopeful that they will be able to do something for him to help prevent ongoing issues with edema control as well. No fevers, chills, nausea, vomiting, or diarrhea. 04/11/2019 on evaluation today patient actually appears to be doing poorly after just being discharged on Monday of this week. He had been experiencing issues with again blisters especially on the left lower extremity. With that being said he was completely healed and appeared to be doing great this past Monday. He then subsequently has new blisters that formed before his appointment with vascular this morning. He was also measured for shoes in the interim. With that being said we may have figured out what exactly is going on and why he continues to have issues like what we are seeing at this point. He takes his compression stockings off at nighttime and then he ends up having to sleep in his chair for 5-6 hours a night. He sleeps with his feet down he cannot really get him up in the recliner and therefore he is sleeping and the worst possible his position with his feet on the floor for that majority of the time. Again as I explained to him that is about one third at minimum at least one fourth of his day that he spending with his feet dangling down on the ground and the worst possible position they could be. I think this may be what is causing the issue. Subsequently I am leaning toward thinking that he may need a hospital bed in order to elevate his legs. We likely can have to coordinate this with his primary care provider at the [facility]. Readmission: 01/26/2021 this is a patient who presents for repeat evaluation here in the clinic although it is actually been couple of years since have seen him in fact it was December 2020 when I last saw him. Subsequently he never really healed but did end up  being lost to follow-up. He tells me has been having issues ongoing with his lower extremities has bilateral lower extremity lymphedema no real significant or definitive open wounds but in general his lymphedema is way out of control. We were never able to refer him to lymphedema clinic simply due to the fact to be honest we were never able to get him completely healed. I do not see anyone with open wounds. The patient does have evidence of type 2 diabetes mellitus, lymphedema, chronic venous insufficiency, and hypertension. That really has not changed since his last evaluation. 02/09/2021 upon evaluation today patient appears to be doing a little better in regard to his legs although he still having a tremendous amount of drainage especially on the left leg. Fortunately there does not appear to be any evidence of active infection. Of note when we looked into this further it appears that the patient did not have any absorptive dressing on it was just the 4-layer compression wrap. Nonetheless this is probably big part of the issue here. Electronic Signature(s) Signed: 02/09/2021 9:00:47 AM By: Worthy Keeler PA-C Entered By: Worthy Keeler on 02/09/2021 09:00:47 [name] ([id number]) -------------------------------------------------------------------------------- Physical Exam Details Patient Name: [name], [name] Date of Service: 02/09/2021 8:00 AM Medical Record Number: [id number] Patient Account Number: [id number] Date of Birth/Sex: 06/10/1950 (70 y.o. M) Treating RN: [name] Primary Care Provider: Felipa Eth Other Clinician: Referring Provider: Felipa Eth Treating Provider/Extender: Jeri Cos Weeks in Treatment: 2 Constitutional Well-nourished and well-hydrated in no acute distress. Respiratory normal breathing without difficulty. Psychiatric this patient is able to  make decisions and demonstrates good insight into disease process. Alert and Oriented x 3. pleasant  and cooperative. Notes Upon inspection patient's wound bed actually showed signs of good granulation epithelization at this point. Fortunately there does not appear to be any evidence of active infection which is great news and overall I am extremely pleased with where we stand in that regard. With that being said he has a tremendous amount of drainage which is the main issue that I am seeing at this point. I do believe that part of this problem is that he has not had appropriate absorptive dressings on we had in the order Zetuvit or a formulary equivalent should be used. With that being said nothing was on other than just a 4-layer wrap obviously this is not controlling the drainage sufficiently. Also think he probably needs to have this changed more frequently. Electronic Signature(s) Signed: 02/09/2021 9:01:29 AM By: Worthy Keeler PA-C Entered By: Worthy Keeler on 02/09/2021 09:01:29 [name] ([id number]) -------------------------------------------------------------------------------- Physician Orders Details Patient Name: [name] Date of Service: 02/09/2021 8:00 AM Medical Record Number: [id number] Patient Account Number: [id number] Date of Birth/Sex: 1950/09/10 (70 y.o. M) Treating RN: [name] Primary Care Provider: Felipa Eth Other Clinician: Referring Provider: Felipa Eth Treating Provider/Extender: Skipper Cliche in Treatment: 2 Verbal / Phone Orders: No Diagnosis Coding ICD-10 Coding Code Description E11.622 Type 2 diabetes mellitus with other skin ulcer I89.0 Lymphedema, not elsewhere classified I87.2 Venous insufficiency (chronic) (peripheral) L97.822 Non-pressure chronic ulcer of other part of left lower leg with fat layer exposed L97.512 Non-pressure chronic ulcer of other part of right foot with fat layer exposed I10 Essential (primary) hypertension Follow-up Appointments o Return Appointment in 1 week. [place] for wound care. May utilize formulary equivalent dressing for wound treatment orders unless otherwise specified. Home Health Nurse may visit [facility] to address patientos wound care needs. - frequency 3 times per week - patient will be seen once at wound center - home health to see patient 2 times per week on Wed and friday Bathing/ Shower/ Hygiene o May shower; gently cleanse wound with antibacterial soap, rinse and pat dry prior to dressing wounds Edema Control - Lymphedema / Segmental Compressive Device / Other Bilateral Lower Extremities o Optional: One layer of unna paste to top of compression wrap (to act as [name] anchor). o 4 Layer Compression System Lymphedema. - bi lat, apply zetuvits/ or formulary for extra absorbant dressing around ankles and foot area, 3 times per week o Elevate, Exercise Daily and Avoid Standing for Long Periods of Time. o Elevate legs to the level of the heart and pump ankles as often as possible o Elevate leg(s) parallel to the floor when sitting. Off-Loading o Open toe surgical shoe Electronic Signature(s) Signed: 02/09/2021 5:40:02 PM By: Worthy Keeler PA-C Signed: 02/12/2021 12:57:29 PM By: [name] Coria RN Entered By: [name] on 02/09/2021 08:37:41 [name] ([id number]) -------------------------------------------------------------------------------- Problem List Details Patient Name: [name] Date of Service: 02/09/2021 8:00 AM Medical Record Number: [id number] Patient Account Number: [id number] Date of Birth/Sex: [birth year] (70 y.o. M) Treating RN: [name] Primary Care Provider: Felipa Eth Other Clinician: Referring Provider: Felipa Eth Treating Provider/Extender: Skipper Cliche in Treatment: 2 Active Problems ICD-10 Encounter Code Description Active Date MDM Diagnosis E11.622 Type 2 diabetes mellitus with other skin ulcer 01/26/2021 No Yes I89.0 Lymphedema, not elsewhere classified 01/26/2021 No  Yes I87.2 Venous insufficiency (chronic) (peripheral) 01/26/2021 No Yes L97.822 Non-pressure chronic ulcer  of other part of left lower leg with fat layer 01/26/2021 No Yes exposed L97.512 Non-pressure chronic ulcer of other part of right foot with fat layer 01/26/2021 No Yes exposed [place] (primary) hypertension 01/26/2021 No Yes Inactive Problems Resolved Problems Electronic Signature(s) Signed: 02/09/2021 5:40:02 PM By: Worthy Keeler PA-C Entered By: Worthy Keeler on 02/09/2021 08:21:10 [name] ([id number]) -------------------------------------------------------------------------------- Progress Note Details Patient Name: [name] Date of Service: 02/09/2021 8:00 AM Medical Record Number: [id number] Patient Account Number: [id number] Date of Birth/Sex: 08-15-1950 (70 y.o. M) Treating RN: [name] Primary Care Provider: Felipa Eth Other Clinician: Referring Provider: Felipa Eth Treating Provider/Extender: Skipper Cliche in Treatment: 2 Subjective Chief Complaint Information obtained from Patient Left LE ulcers History of Present Illness (HPI) 70 year old male who presented to the ER with bilateral lower extremity blisters which had started last week. he has a past medical history of leukemia, diabetes mellitus, hypertension, edema of both lower extremities, his recurrent skin infections, peripheral vascular disease, coronary artery disease, congestive heart failure and peripheral neuropathy. in the ER he was given Rocephin and put on Silvadene cream. he was put on oral doxycycline and was asked to follow-up with the [facility]. His last hemoglobin A1c was 6.6 in December and he checks his blood sugar once a week. He does not have any physicians outside the [place] system. He does not recall any vascular duplex studies done either for arterial or venous disease but was told to wear compression stockings which he does not use 05/30/2016 -- we have not yet  received any of his notes from the [facility] hospital system and his arterial and venous duplex studies are scheduled here in [place] around mid February. We are unable to have his insurance accepted by home health agencies and hence he is getting dressings only once a week. 06/06/16 -- -- I received a call from the patient's PCP at the [facility] at [facility] and spoke to Dr. Garvin Fila, phone number [phone] and fax number [phone]. She confirmed that no vascular testing was done over the last 5 years and she would be happy to do them if the patient did want them to be done at the [place] and we could fax him a request. Readmission: 70 year old male seen by as in February of this year and was referred to vein and vascular for studies and opinion from the vascular surgeons. The patient returns today with a fresh problem having had blisters on his left lower extremity which have been there for about 5 days and he clearly states that he has been wearing his compression stockings as advised though he could not read the moderate compression and has been wearing light compression. Review of his electronic medical records note that he had lower extremity arterial duplex examination done on 06/23/2016 which showed no hemodynamically significant stenosis in the bilateral lower extremity arterial system. He also had a lower extremity venous reflux examination done on 07/07/2016 and it was noted that he had venous incompetence in the right great saphenous vein and bilateral common femoral veins. Patient was seen by Dr. Tamala Julian on the same day and for some reason his notes do not reflect the venous studies or the arterial studies and he recommended patient do a venous duplex ultrasound to look for reflux and return to see him.he would also consider a lymph pump if required. The patient was told that his workup was normal and hence the patient canceled his follow-up appointment. 02/03/17 on evaluation today patient  left medial lower extremity blister appears to be doing about the same. It is still continuing to drain and there's still the blistered skin covering the wound bed which is making it difficult for the alternate to do its job. Fortunately there is no evidence of cellulitis. No fevers chills noted. Patient states in general he is not having any significant discomfort. Patient's lower extremity arterial duplex exam revealed that patient was hemodynamically stable with no evidence of stenosis in regard to the bilateral lower extremities. The lower extremity venous reflux exam revealed the patient had venous incontinence noted in the right greater saphenous and bilateral common femoral vein. There is no evidence of deep or superficial vein thrombosis in the bilateral lower extremities. Readmission: 11/12/18 Patient presents for evaluation our clinic today concerning issues that he is having with his left lower extremity. He tells me that a couple weeks ago he began developing blisters on the left lower extremity along with increased swelling. He typically wears his compression stockings on a regular basis is previously been evaluated both here as well is with vascular surgery they would recommend lymphedema pumps but unfortunately that somehow fell through and he never heard anything back from that. Nonetheless I think lymphedema pumps would be beneficial for this patient. He does have a history of hypertension and diabetes. Obviously the chronic venous stasis and lymphedema as well. At this point the blisters have been given in more trouble he states sometimes when the blisters openings able to clean it down with alcohol and it will dry out and do well. Unfortunately that has not been the case this time. He is having some discomfort although this mean these with cleaning the areas he doesn't have discomfort just on a regular basis. He has not been able to wear his compression stockings since the blisters  arose due to the fact that of course it will drain into the socks causing additional issues and he didn't have any way to wrap this otherwise. He has increased to taking his Lasix every day instead of every other day. He sees his primary care provider later this month as well. No fevers, chills, nausea, or vomiting noted at this time. 11/19/18-Patient returns at 1 week, per intake RN the amount of seepage into the compression wraps was definitely improved, overall all the wounds are measuring smaller but continuing silver alginate to the wounds as primary dressing 11/26/18 on evaluation today patient appears to be doing quite well in regard to his left lower [place] ulcers. In fact of the areas that were noted initially he only has two regions still open. There is no evidence of active infection at this time. He still is not heard anything from the company regarding lymphedema pumps as of yet. Again as previously seen vascular they have not recommended any surgical intervention. [name], [name] ([id number]) 12/03/2018 on evaluation today patient actually appears to be doing quite well with regard to his lower extremity ulcers. In fact most of the areas appear to be healed the one spot which does not seem to be completely healed I am unsure of whether or not this is really draining that much but nonetheless there does not appear to be any signs of infection or significant drainage at this point. There is no sign of fever, chills, nausea, vomiting, or diarrhea. Overall I am pleased with how things have progressed I think is very close to being able to transition to his home compression stockings. 12/10/2018 upon evaluation today patient appears to be  doing quite well with regard to his left lower extremity. He has been tolerating the dressing changes without complication. Fortunately there is no signs of active infection at this time. He appears after thorough evaluation of his leg to only have 1 small area  that remains open at this point everything else appears to be almost completely closed. He still have significant swelling of the left lower extremity. We had discussed discussing this with his primary care provider he is not able to see her in person they were at the [facility] and right now the [place] is not seeing patients on site. According to the patient anyway. Subsequently he did speak with her apparently and his primary care provider feels that he may likely have a DVT. With that being said she has not seen his leg she is just going off of his history. Nonetheless that is a concern that the patient now has as well and while I do not feel the DVT is likely we can definitely ensure that that is not the case I will go ahead and see about putting that order in today. Nonetheless otherwise I am in a recommend that we continue with the current wound care measures including the compression therapy most likely. We just need to ensure that his leg is indeed free of any DVTs. 12/17/2018 on evaluation today patient actually appears to be completely healed today. He does have 2 very small areas of blistering although this is not anything too significant at this point which is good news. With that being said I am in agreement with the fact that I think he is completely healed at this point. He does want to get back into his compression stocking. The good news is we have gotten approval from insurance for his lymphedema pumps we received a letter since last saw him last week. The other good news is his study did come back and showed no evidence of a DVT. 12/20/2018 on evaluation today patient presents for follow-up concerning his ongoing issues with his left lower extremity. He was actually discharged last Friday and did fairly well until he states blisters opened this morning. He tells me he has been wearing his compression stocking although he has a hard time getting this on. There does not appear to be any  signs of active infection at this time. No fevers, chills, nausea, vomiting, or diarrhea. 12/27/2018 on evaluation today patient appears to be doing very well with regard to his swelling of the left lower extremity the 4 layer compression wrap seems to have been beneficial for him. Fortunately there is no signs of active infection at this time. Patient has been tolerating the compression wrap without complication and his foot swelling in particular appears to be greatly improved. He does still have a wound on the lateral portion of his left leg I believe this is more of a blister that has now reopened. 01/03/2019 on evaluation today patient actually appears to be doing excellent in regard to his left lower extremity. He did receive his compression pumps and is actually use this 7 times since he was last here in the office. On top of the compression wrap he is now roughly 3 cm better at the calf and 2 cm better at the ankle he also states that his foot seem to go [name] issue better without even having to use a shoe horn. Obviously I think this is all evidence that he is doing excellent in this regard. The other good  news is he does not appear to have anything open today as far as wounds are concerned. 01/15/2019 on evaluation today patient appears to be doing more poorly yet again with regard to his left lower extremity. He has developed new wounds again after being discharged just recently. Unfortunately this continues to be the case that he will heal and then have subsequent new wounds. The last time I was hopeful that he may not end up coming back too quickly especially since he states he has been using his lymphedema pumps along with wearing his compression. Nonetheless he had a blister on the back of his leg that popped up on the left and this has opened up into [name] ulceration it is quite painful. 01/22/19 on evaluation today patient actually appears to be doing well with regard to his wound on the left  lower extremity. He's been tolerating the dressing changes without complication including the compression wrap in the wound appears to be significantly smaller today which is great news. Overall very pleased in this regard. 01/29/2019 on evaluation today patient appears to be doing well with regard to his left posterior lower extremity ulcer. He has been tolerating the dressing changes without complication. This is not completely healed but is getting much closer. We did order a Farrow wrap 4000 for him he has received this and has it with him today although I am not sure we are quite ready to start him on that as of yet. We are very close. 02/05/2019 on evaluation today patient actually appears to be doing quite well with regard to his left posterior lower extremity ulcer. He still has a very tiny opening remaining but the fortunate thing is he seems to be healing quite nicely. He also did get his Farrow wrap which I am hoping will help with his edema control as well at home. Fortunately there is no evidence of active infection. 02/12/2019 patient and fortunately appears to be doing poorly in regard to his wounds of the left lower extremity. He was very close to healing therefore we attempted to use his Velcro compression wraps continuing with lymphedema pumps at home. Unfortunately that does not seem to have done very well for him. He tells me that he wore them all the time but again I am not sure why if that is the case that he is having such significant edema. He is still on his fluid pills as well. With that being said there is no obvious sign of infection although I do wonder about the possibility of infection at this time as well. 02/19/2019 unfortunately upon evaluation today patient appears to be doing more poorly with regard to his left lower extremity. He is not showing signs of significant improvement and I think the biggest issue here is that he does have [name] infection that appears to likely  be Pseudomonas. That is based on the blue-green drainage that were noted at this time. Unfortunately the antibiotic that has been on is not going to take care of this at all. I think they will get a need to switch him to either Levaquin or Cipro and this was discussed with the patient. 02/26/2019 on evaluation today patient's lower extremity on the left appears to be doing significantly better as compared to last evaluation. Fortunately there is no signs of active infection at this time. He has been tolerating the compression wrap without complication in fact he made it the whole week at this point. He is showing signs of excellent  improvement I am very happy in this regard. With that being said he is having some issues with infection we did review the results of his culture which I noted today. He did have a positive finding for Enterobacter as well as Alcaligenes faecalis. Fortunately the Levaquin that I placed him on will work for both which is great news. There is no signs of systemic infection at this point. 10/30; left posterior leg wound in the setting of very significant edema and what looks like chronic venous inflammation. He has compression pumps but does not use them. We have been using 3 layer compression. Silver alginate to the wound as the primary dressing 03/18/2019 on evaluation today patient appears to be doing a little better compared to last time I saw him. He really has not been using his compression pumps he tells me that he is having too much discomfort. He has been keeping his wraps on however. He is only been taking his fluid pills every other day because he states they are not really helping and he has [name] appointment with his primary care provider at the [facility] tomorrow. Subsequently the wound itself on the left lower extremity does seem to be greatly improved compared to previous. [name], [name] ([id number]) 03/25/2019 on evaluation today patient appears to be doing better with  regard to his wounds on the bilateral lower extremities. The left is doing excellent the right is also doing better although both still do show some signs of open wounds noted at this point unfortunately. Fortunately there is no signs of active infection at this time. The patient also is not really having any significant pain which is good news. Unfortunately there was some confusion with the referral on vascular disease and as far as getting the patient scheduled there can be contacting him later today to do this fortunately we got this straightened out. 04/01/2019 on evaluation today patient appears to be doing no fevers, chills, nausea, vomiting, or diarrhea. Excellent at this time with regard to his lower extremities. There does not appear to be any open wound at this point which is good news. Fortunately is also no signs of active infection at this time. Overall feel like the patient has done excellent with the compression the problem is every time we got him to this point and then subsequently go to using his own compression things just go right back to where they were. I am not sure how to address this we can try to get [name] appointment with vascular for 2 weeks now they have yet to call him. Obviously this has become frustrating for the patient as well. I think the issue has just been [name] honest error as far as scheduling is concerned but nonetheless still worn out the point where I am unsure of which direction we should take. 04/08/2019 on evaluation today patient actually appears to be doing well with regard to his lower extremities. There are no open wounds at this time and things seem to be managing quite nicely as far as the overall edema control is concerned. With that being said he does have his compression socks today for [place] to go ahead and reinitiate therapy in that manner at this point. He is going to be going for shoes to be measured on Wednesday and then coincidentally he will also be  seeing vascular on Thursday. Overall I think this is good news and again I am hopeful that they will be able to do something for him to help  prevent ongoing issues with edema control as well. No fevers, chills, nausea, vomiting, or diarrhea. 04/11/2019 on evaluation today patient actually appears to be doing poorly after just being discharged on Monday of this week. He had been experiencing issues with again blisters especially on the left lower extremity. With that being said he was completely healed and appeared to be doing great this past Monday. He then subsequently has new blisters that formed before his appointment with vascular this morning. He was also measured for shoes in the interim. With that being said we may have figured out what exactly is going on and why he continues to have issues like what we are seeing at this point. He takes his compression stockings off at nighttime and then he ends up having to sleep in his chair for 5-6 hours a night. He sleeps with his feet down he cannot really get him up in the recliner and therefore he is sleeping and the worst possible his position with his feet on the floor for that majority of the time. Again as I explained to him that is about one third at minimum at least one fourth of his day that he spending with his feet dangling down on the ground and the worst possible position they could be. I think this may be what is causing the issue. Subsequently I am leaning toward thinking that he may need a hospital bed in order to elevate his legs. We likely can have to coordinate this with his primary care provider at the [facility]. Readmission: 01/26/2021 this is a patient who presents for repeat evaluation here in the clinic although it is actually been couple of years since have seen him in fact it was December 2020 when I last saw him. Subsequently he never really healed but did end up being lost to follow-up. He tells me has been having issues  ongoing with his lower extremities has bilateral lower extremity lymphedema no real significant or definitive open wounds but in general his lymphedema is way out of control. We were never able to refer him to lymphedema clinic simply due to the fact to be honest we were never able to get him completely healed. I do not see anyone with open wounds. The patient does have evidence of type 2 diabetes mellitus, lymphedema, chronic venous insufficiency, and hypertension. That really has not changed since his last evaluation. 02/09/2021 upon evaluation today patient appears to be doing a little better in regard to his legs although he still having a tremendous amount of drainage especially on the left leg. Fortunately there does not appear to be any evidence of active infection. Of note when we looked into this further it appears that the patient did not have any absorptive dressing on it was just the 4-layer compression wrap. Nonetheless this is probably big part of the issue here. Objective Constitutional Well-nourished and well-hydrated in no acute distress. Vitals Time Taken: 8:08 AM, Height: 73 in, Weight: 312 lbs, BMI: 41.2, Temperature: 97.8 F, Pulse: 84 bpm, Respiratory Rate: 18 breaths/min, Blood Pressure: 120/75 mmHg. Respiratory normal breathing without difficulty. Psychiatric this patient is able to make decisions and demonstrates good insight into disease process. Alert and Oriented x 3. pleasant and cooperative. General Notes: Upon inspection patient's wound bed actually showed signs of good granulation epithelization at this point. Fortunately there does not appear to be any evidence of active infection which is great news and overall I am extremely pleased with where we stand in that  regard. With that being said he has a tremendous amount of drainage which is the main issue that I am seeing at this point. I do believe that part of this problem is that he has not had appropriate  absorptive dressings on we had in the order Zetuvit or a formulary equivalent should be used. With that being said nothing was on other than just a 4-layer wrap obviously this is not controlling the drainage sufficiently. Also think he probably [place] ([id number]) needs to have this changed more frequently. Other Condition(s) Patient presents with Lymphedema located on the Left Leg. Patient presents with Lymphedema located on the Right Leg. Assessment Active Problems ICD-10 Type 2 diabetes mellitus with other skin ulcer Lymphedema, not elsewhere classified Venous insufficiency (chronic) (peripheral) Non-pressure chronic ulcer of other part of left lower leg with fat layer exposed Non-pressure chronic ulcer of other part of right foot with fat layer exposed Essential (primary) hypertension Plan Follow-up Appointments: Return Appointment in 1 week. Home Health: [facility] for wound care. May utilize formulary equivalent dressing for wound treatment orders unless otherwise specified. Home Health Nurse may visit [facility] to address patient s wound care needs. - frequency 3 times per week - patient will be seen once at wound center - home health to see patient 2 times per week on Wed and friday Bathing/ Shower/ Hygiene: May shower; gently cleanse wound with antibacterial soap, rinse and pat dry prior to dressing wounds Edema Control - Lymphedema / Segmental Compressive Device / Other: Optional: One layer of unna paste to top of compression wrap (to act as [name] anchor). 4 Layer Compression System Lymphedema. - bi lat, apply zetuvits/ or formulary for extra absorbant dressing around ankles and foot area, 3 times per week Elevate, Exercise Daily and Avoid Standing for Long Periods of Time. Elevate legs to the level of the heart and pump ankles as often as possible Elevate leg(s) parallel to the floor when sitting. Off-Loading: Open toe surgical shoe 1. Would recommend currently  that we continue with a 4-layer compression wrap using Zetuvit to catch the excessive drainage which I think is going to be much better for him than just leaving it sitting on the 4-layer wrap. 2. I am also can recommend that we have the patient continue to elevate his legs I think this is can be of utmost importance. 3. I would also recommend that we get in touch with the home health nurse to see about having her change the dressing on Wednesday and Friday and we will be doing the dressing changes on Monday. We will see patient back for reevaluation in 1 week here in the clinic. If anything worsens or changes patient will contact our office for additional recommendations. Electronic Signature(s) Signed: 02/09/2021 9:02:19 AM By: Worthy Keeler PA-C Entered By: Worthy Keeler on 02/09/2021 09:02:19 [name] ([id number]) -------------------------------------------------------------------------------- SuperBill Details Patient Name: [name] Date of Service: 02/09/2021 Medical Record Number: [id number] Patient Account Number: [id number] Date of Birth/Sex: 1950/12/09 (70 y.o. M) Treating RN: [name] Primary Care Provider: Felipa Eth Other Clinician: Referring Provider: Felipa Eth Treating Provider/Extender: Skipper Cliche in Treatment: 2 Diagnosis Coding ICD-10 Codes Code Description [phone] Type 2 diabetes mellitus with other skin ulcer I89.0 Lymphedema, not elsewhere classified I87.2 Venous insufficiency (chronic) (peripheral) L97.822 Non-pressure chronic ulcer of other part of left lower leg with fat layer exposed L97.512 Non-pressure chronic ulcer of other part of right foot with fat layer exposed I10 Essential (primary) hypertension Facility Procedures  CPT4: Description Modifier Quantity Code [id number] BILATERAL: Application of multi-layer venous compression system; leg (below knee), including 1 ankle and foot. Physician Procedures CPT4 Code:  2763943 Description: 20037 - WC PHYS LEVEL 4 - EST PT Modifier: Quantity: 1 CPT4 Code: Description: ICD-10 Diagnosis Description E11.622 Type 2 diabetes mellitus with other skin ulcer I89.0 Lymphedema, not elsewhere classified I87.2 Venous insufficiency (chronic) (peripheral) L97.822 Non-pressure chronic ulcer of other part of left lower  leg with fat lay Modifier: er exposed Quantity: Electronic Signature(s) Signed: 02/09/2021 9:02:37 AM By: Worthy Keeler PA-C Entered By: Worthy Keeler on 02/09/2021 09:02:36

## 2021-02-12 NOTE — Progress Notes (Signed)
[name], [name] ([id number]) Visit Report for 02/09/2021 Arrival Information Details Patient Name: [name], [name] Date of Service: 02/09/2021 8:00 AM Medical Record Number: [id number] Patient Account Number: [id number] Date of Birth/Sex: 05/28/1950 (70 y.o. M) Treating RN: [name] Primary Care Mercedees Convery: Felipa Eth Other Clinician: Referring Keyion Knack: Felipa Eth Treating Elford Evilsizer/Extender: Skipper Cliche in Treatment: 2 Visit Information History Since Last Visit All ordered tests and consults were completed: No Patient Arrived: Ambulatory Added or deleted any medications: No Arrival Time: 08:03 Any new allergies or adverse reactions: No Accompanied By: self Had a fall or experienced change in No Transfer Assistance: None activities of daily living that may affect Patient Identification Verified: Yes risk of falls: Secondary Verification Process Completed: Yes Signs or symptoms of abuse/neglect since last visito No Patient Requires Transmission-Based Precautions: No Hospitalized since last visit: No Patient Has Alerts: No Implantable device outside of the clinic excluding No cellular tissue based products placed in the center since last visit: Has Dressing in Place as Prescribed: Yes Has Compression in Place as Prescribed: Yes Pain Present Now: No Electronic Signature(s) Signed: 02/12/2021 12:57:29 PM By: [name] RN Entered By: [name] on 02/09/2021 08:08:18 [name] ([id number]) -------------------------------------------------------------------------------- Clinic Level of Care Assessment Details Patient Name: [name] Date of Service: 02/09/2021 8:00 AM Medical Record Number: [id number] Patient Account Number: [id number] Date of Birth/Sex: [birth year] (70 y.o. M) Treating RN: [name] Primary Care Hayward Rylander: Felipa Eth Other Clinician: Referring Atiba Kimberlin: Felipa Eth Treating Haeleigh Streiff/Extender: Skipper Cliche in Treatment:  2 Clinic Level of Care Assessment Items TOOL 1 Quantity Score []  - Use when EandM and Procedure is performed on INITIAL visit 0 ASSESSMENTS - Nursing Assessment / Reassessment []  - General Physical Exam (combine w/ comprehensive assessment (listed just below) when performed on new 0 pt. evals) []  - 0 Comprehensive Assessment (HX, ROS, Risk Assessments, Wounds Hx, etc.) ASSESSMENTS - Wound and Skin Assessment / Reassessment []  - Dermatologic / Skin Assessment (not related to wound area) 0 ASSESSMENTS - Ostomy and/or Continence Assessment and Care []  - Incontinence Assessment and Management 0 []  - 0 Ostomy Care Assessment and Management (repouching, etc.) PROCESS - Coordination of Care []  - Simple Patient / Family Education for ongoing care 0 []  - 0 Complex (extensive) Patient / Family Education for ongoing care []  - 0 Staff obtains Programmer, systems, Records, Test Results / Process Orders []  - 0 Staff telephones HHA, Nursing Homes / Clarify orders / etc []  - 0 Routine Transfer to another Facility (non-emergent condition) []  - 0 Routine Hospital Admission (non-emergent condition) []  - 0 New Admissions / Biomedical engineer / Ordering NPWT, Apligraf, etc. []  - 0 Emergency Hospital Admission (emergent condition) PROCESS - Special Needs []  - Pediatric / Minor Patient Management 0 []  - 0 Isolation Patient Management []  - 0 Hearing / Language / Visual special needs []  - 0 Assessment of Community assistance (transportation, D/C planning, etc.) []  - 0 Additional assistance / Altered mentation []  - 0 Support Surface(s) Assessment (bed, cushion, seat, etc.) INTERVENTIONS - Miscellaneous []  - External ear exam 0 []  - 0 Patient Transfer (multiple staff / Civil Service fast streamer / Similar devices) []  - 0 Simple Staple / Suture removal (25 or less) []  - 0 Complex Staple / Suture removal (26 or more) []  - 0 Hypo/Hyperglycemic Management (do not check if billed separately) []  - 0 Ankle /  Brachial Index (ABI) - do not check if billed separately Has the patient been seen at the hospital within the last three years: Yes Total  Score: 0 Level Of Care: ____ [name] ([id number]) Electronic Signature(s) Signed: 02/12/2021 12:57:29 PM By: [name] RN Entered By: [name] on 02/09/2021 08:38:59 [name] ([id number]) -------------------------------------------------------------------------------- Encounter Discharge Information Details Patient Name: [name] Date of Service: 02/09/2021 8:00 AM Medical Record Number: [id number] Patient Account Number: [id number] Date of Birth/Sex: 19-Aug-1950 (70 y.o. M) Treating RN: [name] Primary Care Kaston Faughn: Felipa Eth Other Clinician: Referring Lorenz Donley: Felipa Eth Treating Tinaya Ceballos/Extender: Skipper Cliche in Treatment: 2 Encounter Discharge Information Items Discharge Condition: Stable Ambulatory Status: Ambulatory Discharge Destination: Home Transportation: Private Auto Accompanied By: self Schedule Follow-up Appointment: Yes Clinical Summary of Care: Patient Declined Electronic Signature(s) Signed: 02/12/2021 12:57:29 PM By: [name] RN Entered By: [name] on 02/09/2021 08:56:18 [name] ([id number]) -------------------------------------------------------------------------------- Lower Extremity Assessment Details Patient Name: [name] Date of Service: 02/09/2021 8:00 AM Medical Record Number: [id number] Patient Account Number: [id number] Date of Birth/Sex: 06/18/1950 (69 y.o. M) Treating RN: [name] Primary Care Rollin Kotowski: Felipa Eth Other Clinician: Referring Elliett Guarisco: Felipa Eth Treating Geordan Xu/Extender: Jeri Cos Weeks in Treatment: 2 Edema Assessment Assessed: [Left: No] [Right: No] [Left: Edema] [Right: :] Calf Left: Right: Point of Measurement: 35 cm From Medial Instep 40 cm 40 cm Ankle Left: Right: Point of Measurement: 10 cm From Medial  Instep 37 cm 35 cm Electronic Signature(s) Signed: 02/12/2021 12:57:29 PM By: [name] RN Entered By: [name] on 02/09/2021 08:23:04 [name] ([id number]) -------------------------------------------------------------------------------- Multi Wound Chart Details Patient Name: [name] Date of Service: 02/09/2021 8:00 AM Medical Record Number: [id number] Patient Account Number: [id number] Date of Birth/Sex: [birth year] (70 y.o. M) Treating RN: [name] Primary Care Wash Nienhaus: Felipa Eth Other Clinician: Referring Mariha Sleeper: Felipa Eth Treating Tyronda Vizcarrondo/Extender: Skipper Cliche in Treatment: 2 Vital Signs Height(in): 73 Pulse(bpm): 84 Weight(lbs): 312 Blood Pressure(mmHg): 120/75 Body Mass Index(BMI): 41 Temperature(F): 97.8 Respiratory Rate(breaths/min): 18 Wound Assessments Treatment Notes Electronic Signature(s) Signed: 02/12/2021 12:57:29 PM By: [name] RN Entered By: [name] on 02/09/2021 08:33:54 [name] ([id number]) -------------------------------------------------------------------------------- [place] Details Patient Name: [name] Date of Service: 02/09/2021 8:00 AM Medical Record Number: [id number] Patient Account Number: [id number] Date of Birth/Sex: August 09, 1950 (70 y.o. M) Treating RN: [name] Primary Care Keysi Oelkers: Felipa Eth Other Clinician: Referring Shadara Lopez: Felipa Eth Treating Lailoni Baquera/Extender: Skipper Cliche in Treatment: 2 Active Inactive Wound/Skin Impairment Nursing Diagnoses: Knowledge deficit related to ulceration/compromised skin integrity Goals: Patient/caregiver will verbalize understanding of skin care regimen Date Initiated: 01/26/2021 Target Resolution Date: 02/25/2021 Goal Status: Active Interventions: Assess patient/caregiver ability to obtain necessary supplies Assess patient/caregiver ability to perform ulcer/skin care regimen upon admission and  as needed Assess ulceration(s) every visit Notes: Electronic Signature(s) Signed: 02/12/2021 12:57:29 PM By: [name] RN Entered By: [name] on 02/09/2021 08:33:23 [name] ([id number]) -------------------------------------------------------------------------------- Non-Wound Condition Assessment Details Patient Name: [name] Date of Service: 02/09/2021 8:00 AM Medical Record Number: [id number] Patient Account Number: [id number] Date of Birth/Sex: Jun 10, 1950 (70 y.o. M) Treating RN: [name] Primary Care Veretta Sabourin: Felipa Eth Other Clinician: Referring Gerre Ranum: Felipa Eth Treating Jaymen Fetch/Extender: Jeri Cos Weeks in Treatment: 2 Non-Wound Condition: Condition: Lymphedema Location: Leg Side: Left Notes: Anterior aspect of lower leg Photos Electronic Signature(s) Signed: 02/12/2021 12:57:29 PM By: [name] RN Entered By: [name] on 02/09/2021 08:21:35 [name] ([id number]) -------------------------------------------------------------------------------- Non-Wound Condition Assessment Details Patient Name: [name] Date of Service: 02/09/2021 8:00 AM Medical Record Number: [id number] Patient Account Number: [id number] Date of Birth/Sex: [birth year] (70 y.o. M) Treating RN: [name] Primary Care Damonta Cossey:  Felipa Eth Other Clinician: Referring Cliffard Hair: Felipa Eth Treating Irving Lubbers/Extender: Jeri Cos Weeks in Treatment: 2 Non-Wound Condition: Condition: Lymphedema Location: Leg Side: Right Photos Electronic Signature(s) Signed: 02/12/2021 12:57:29 PM By: [name] RN Entered By: [name] on 02/09/2021 08:21:58 [name] ([id number]) -------------------------------------------------------------------------------- Pain Assessment Details Patient Name: [name] Date of Service: 02/09/2021 8:00 AM Medical Record Number: [id number] Patient Account Number: [id number] Date of Birth/Sex: Jul 22, 1950  (70 y.o. M) Treating RN: [name] Primary Care Jaiyden Laur: Felipa Eth Other Clinician: Referring Shirelle Tootle: Felipa Eth Treating Ava Tangney/Extender: Skipper Cliche in Treatment: 2 Active Problems Location of Pain Severity and Description of Pain Patient Has Paino No Site Locations Pain Management and Medication Current Pain Management: Electronic Signature(s) Signed: 02/12/2021 12:57:29 PM By: [name] RN Entered By: [name] on 02/09/2021 08:08:41 [name] ([id number]) -------------------------------------------------------------------------------- Patient/Caregiver Education Details Patient Name: [name] Date of Service: 02/09/2021 8:00 AM Medical Record Number: [id number] Patient Account Number: [id number] Date of Birth/Gender: 05-26-1950 (70 y.o. M) Treating RN: [name] Primary Care Physician: Felipa Eth Other Clinician: Referring Physician: Felipa Eth Treating Physician/Extender: Skipper Cliche in Treatment: 2 Education Assessment Education Provided To: Patient Education Topics Provided Wound/Skin Impairment: Methods: Explain/Verbal Responses: State content correctly Electronic Signature(s) Signed: 02/12/2021 12:57:29 PM By: [name] RN Entered By: [name] on 02/09/2021 08:40:06 [name] ([id number]) -------------------------------------------------------------------------------- [place] Details Patient Name: [name] Date of Service: 02/09/2021 8:00 AM Medical Record Number: [id number] Patient Account Number: [id number] Date of Birth/Sex: Aug 18, 1950 (70 y.o. M) Treating RN: [name] Primary Care Landry Kamath: Felipa Eth Other Clinician: Referring Viviana Trimble: Felipa Eth Treating Laurier Jasperson/Extender: Skipper Cliche in Treatment: 2 Vital Signs Time Taken: 08:08 Temperature (F): 97.8 Height (in): 73 Pulse (bpm): 84 Weight (lbs): 312 Respiratory Rate (breaths/min): 18 Body Mass Index  (BMI): 41.2 Blood Pressure (mmHg): 120/75 Reference Range: 80 - 120 mg / dl Electronic Signature(s) Signed: 02/12/2021 12:57:29 PM By: [name] RN Entered By: [name] on 02/09/2021 08:08:35

## 2021-02-15 ENCOUNTER — Encounter: Payer: PPO | Admitting: Internal Medicine

## 2021-02-15 ENCOUNTER — Other Ambulatory Visit: Payer: Self-pay

## 2021-02-15 DIAGNOSIS — E11622 Type 2 diabetes mellitus with other skin ulcer: Secondary | ICD-10-CM | POA: Diagnosis not present

## 2021-02-15 DIAGNOSIS — L97219 Non-pressure chronic ulcer of right calf with unspecified severity: Secondary | ICD-10-CM | POA: Diagnosis not present

## 2021-02-15 DIAGNOSIS — L97811 Non-pressure chronic ulcer of other part of right lower leg limited to breakdown of skin: Secondary | ICD-10-CM | POA: Diagnosis not present

## 2021-02-18 NOTE — Progress Notes (Signed)
[name], [name] ([id number]) Visit Report for 02/15/2021 HPI Details Patient Name: [name], [name] Date of Service: 02/15/2021 10:15 AM Medical Record Number: [id number] Patient Account Number: [id number] Date of Birth/Sex: [birth year] (70 y.o. M) Treating RN: [name] Primary Care Provider: Felipa Eth Other Clinician: Referring Provider: Felipa Eth Treating Provider/Extender: Tito Dine in Treatment: 2 History of Present Illness HPI Description: 70 year old male who presented to the ER with bilateral lower extremity blisters which had started last week. he has a past medical history of leukemia, diabetes mellitus, hypertension, edema of both lower extremities, his recurrent skin infections, peripheral vascular disease, coronary artery disease, congestive heart failure and peripheral neuropathy. in the ER he was given Rocephin and put on Silvadene cream. he was put on oral doxycycline and was asked to follow-up with the [facility]. His last hemoglobin A1c was 6.6 in December and he checks his blood sugar once a week. He does not have any physicians outside the [place] system. He does not recall any vascular duplex studies done either for arterial or venous disease but was told to wear compression stockings which he does not use 05/30/2016 -- we have not yet received any of his notes from the [facility] hospital system and his arterial and venous duplex studies are scheduled here in [place] around mid February. We are unable to have his insurance accepted by home health agencies and hence he is getting dressings only once a week. 06/06/16 -- -- I received a call from the patient's PCP at the [facility] at [facility] and spoke to Dr. Garvin Fila, phone number [phone] and fax number [phone]. She confirmed that no vascular testing was done over the last 5 years and she would be happy to do them if the patient did want them to be done at the [place] and we could fax him a  request. Readmission: 70 year old male seen by as in February of this year and was referred to vein and vascular for studies and opinion from the vascular surgeons. The patient returns today with a fresh problem having had blisters on his left lower extremity which have been there for about 5 days and he clearly states that he has been wearing his compression stockings as advised though he could not read the moderate compression and has been wearing light compression. Review of his electronic medical records note that he had lower extremity arterial duplex examination done on 06/23/2016 which showed no hemodynamically significant stenosis in the bilateral lower extremity arterial system. He also had a lower extremity venous reflux examination done on 07/07/2016 and it was noted that he had venous incompetence in the right great saphenous vein and bilateral common femoral veins. Patient was seen by Dr. Tamala Julian on the same day and for some reason his notes do not reflect the venous studies or the arterial studies and he recommended patient do a venous duplex ultrasound to look for reflux and return to see him.he would also consider a lymph pump if required. The patient was told that his workup was normal and hence the patient canceled his follow-up appointment. 02/03/17 on evaluation today patient left medial lower extremity blister appears to be doing about the same. It is still continuing to drain and there's still the blistered skin covering the wound bed which is making it difficult for the alternate to do its job. Fortunately there is no evidence of cellulitis. No fevers chills noted. Patient states in general he is not having any significant discomfort. Patient's lower extremity arterial duplex exam  revealed that patient was hemodynamically stable with no evidence of stenosis in regard to the bilateral lower extremities. The lower extremity venous reflux exam revealed the patient had venous  incontinence noted in the right greater saphenous and bilateral common femoral vein. There is no evidence of deep or superficial vein thrombosis in the bilateral lower extremities. Readmission: 11/12/18 Patient presents for evaluation our clinic today concerning issues that he is having with his left lower extremity. He tells me that a couple weeks ago he began developing blisters on the left lower extremity along with increased swelling. He typically wears his compression stockings on a regular basis is previously been evaluated both here as well is with vascular surgery they would recommend lymphedema pumps but unfortunately that somehow fell through and he never heard anything back from that. Nonetheless I think lymphedema pumps would be beneficial for this patient. He does have a history of hypertension and diabetes. Obviously the chronic venous stasis and lymphedema as well. At this point the blisters have been given in more trouble he states sometimes when the blisters openings able to clean it down with alcohol and it will dry out and do well. Unfortunately that has not been the case this time. He is having some discomfort although this mean these with cleaning the areas he doesn't have discomfort just on a regular basis. He has not been able to wear his compression stockings since the blisters arose due to the fact that of course it will drain into the socks causing additional issues and he didn't have any way to wrap this otherwise. He has increased to taking his Lasix every day instead of every other day. He sees his primary care provider later this month as well. No fevers, chills, nausea, or vomiting noted at this time. 11/19/18-Patient returns at 1 week, per intake RN the amount of seepage into the compression wraps was definitely improved, overall all the wounds are measuring smaller but continuing silver alginate to the wounds as primary dressing 11/26/18 on evaluation today patient  appears to be doing quite well in regard to his left lower [place] ulcers. In fact of the areas that were noted initially he only has two regions still open. There is no evidence of active infection at this time. He still is not heard anything from the company regarding lymphedema pumps as of yet. Again as previously seen vascular they have not recommended any surgical intervention. 12/03/2018 on evaluation today patient actually appears to be doing quite well with regard to his lower extremity ulcers. In fact most of the areas [place] ([id number]) appear to be healed the one spot which does not seem to be completely healed I am unsure of whether or not this is really draining that much but nonetheless there does not appear to be any signs of infection or significant drainage at this point. There is no sign of fever, chills, nausea, vomiting, or diarrhea. Overall I am pleased with how things have progressed I think is very close to being able to transition to his home compression stockings. 12/10/2018 upon evaluation today patient appears to be doing quite well with regard to his left lower extremity. He has been tolerating the dressing changes without complication. Fortunately there is no signs of active infection at this time. He appears after thorough evaluation of his leg to only have 1 small area that remains open at this point everything else appears to be almost completely closed. He still have significant swelling of the left lower  extremity. We had discussed discussing this with his primary care provider he is not able to see her in person they were at the [facility] and right now the [place] is not seeing patients on site. According to the patient anyway. Subsequently he did speak with her apparently and his primary care provider feels that he may likely have a DVT. With that being said she has not seen his leg she is just going off of his history. Nonetheless that is a concern that the  patient now has as well and while I do not feel the DVT is likely we can definitely ensure that that is not the case I will go ahead and see about putting that order in today. Nonetheless otherwise I am in a recommend that we continue with the current wound care measures including the compression therapy most likely. We just need to ensure that his leg is indeed free of any DVTs. 12/17/2018 on evaluation today patient actually appears to be completely healed today. He does have 2 very small areas of blistering although this is not anything too significant at this point which is good news. With that being said I am in agreement with the fact that I think he is completely healed at this point. He does want to get back into his compression stocking. The good news is we have gotten approval from insurance for his lymphedema pumps we received a letter since last saw him last week. The other good news is his study did come back and showed no evidence of a DVT. 12/20/2018 on evaluation today patient presents for follow-up concerning his ongoing issues with his left lower extremity. He was actually discharged last Friday and did fairly well until he states blisters opened this morning. He tells me he has been wearing his compression stocking although he has a hard time getting this on. There does not appear to be any signs of active infection at this time. No fevers, chills, nausea, vomiting, or diarrhea. 12/27/2018 on evaluation today patient appears to be doing very well with regard to his swelling of the left lower extremity the 4 layer compression wrap seems to have been beneficial for him. Fortunately there is no signs of active infection at this time. Patient has been tolerating the compression wrap without complication and his foot swelling in particular appears to be greatly improved. He does still have a wound on the lateral portion of his left leg I believe this is more of a blister that has now  reopened. 01/03/2019 on evaluation today patient actually appears to be doing excellent in regard to his left lower extremity. He did receive his compression pumps and is actually use this 7 times since he was last here in the office. On top of the compression wrap he is now roughly 3 cm better at the calf and 2 cm better at the ankle he also states that his foot seem to go an issue better without even having to use a shoe horn. Obviously I think this is all evidence that he is doing excellent in this regard. The other good news is he does not appear to have anything open today as far as wounds are concerned. 01/15/2019 on evaluation today patient appears to be doing more poorly yet again with regard to his left lower extremity. He has developed new wounds again after being discharged just recently. Unfortunately this continues to be the case that he will heal and then have subsequent new wounds. The last  time I was hopeful that he may not end up coming back too quickly especially since he states he has been using his lymphedema pumps along with wearing his compression. Nonetheless he had a blister on the back of his leg that popped up on the left and this has opened up into an ulceration it is quite painful. 01/22/19 on evaluation today patient actually appears to be doing well with regard to his wound on the left lower extremity. He's been tolerating the dressing changes without complication including the compression wrap in the wound appears to be significantly smaller today which is great news. Overall very pleased in this regard. 01/29/2019 on evaluation today patient appears to be doing well with regard to his left posterior lower extremity ulcer. He has been tolerating the dressing changes without complication. This is not completely healed but is getting much closer. We did order a Farrow wrap 4000 for him he has received this and has it with him today although I am not sure we are quite ready to  start him on that as of yet. We are very close. 02/05/2019 on evaluation today patient actually appears to be doing quite well with regard to his left posterior lower extremity ulcer. He still has a very tiny opening remaining but the fortunate thing is he seems to be healing quite nicely. He also did get his Farrow wrap which I am hoping will help with his edema control as well at home. Fortunately there is no evidence of active infection. 02/12/2019 patient and fortunately appears to be doing poorly in regard to his wounds of the left lower extremity. He was very close to healing therefore we attempted to use his Velcro compression wraps continuing with lymphedema pumps at home. Unfortunately that does not seem to have done very well for him. He tells me that he wore them all the time but again I am not sure why if that is the case that he is having such significant edema. He is still on his fluid pills as well. With that being said there is no obvious sign of infection although I do wonder about the possibility of infection at this time as well. 02/19/2019 unfortunately upon evaluation today patient appears to be doing more poorly with regard to his left lower extremity. He is not showing signs of significant improvement and I think the biggest issue here is that he does have an infection that appears to likely be Pseudomonas. That is based on the blue-green drainage that were noted at this time. Unfortunately the antibiotic that has been on is not going to take care of this at all. I think they will get a need to switch him to either Levaquin or Cipro and this was discussed with the patient. 02/26/2019 on evaluation today patient's lower extremity on the left appears to be doing significantly better as compared to last evaluation. Fortunately there is no signs of active infection at this time. He has been tolerating the compression wrap without complication in fact he made it the whole week at this  point. He is showing signs of excellent improvement I am very happy in this regard. With that being said he is having some issues with infection we did review the results of his culture which I noted today. He did have a positive finding for Enterobacter as well as Alcaligenes faecalis. Fortunately the Levaquin that I placed him on will work for both which is great news. There is no signs of systemic infection  at this point. 10/30; left posterior leg wound in the setting of very significant edema and what looks like chronic venous inflammation. He has compression pumps but does not use them. We have been using 3 layer compression. Silver alginate to the wound as the primary dressing 03/18/2019 on evaluation today patient appears to be doing a little better compared to last time I saw him. He really has not been using his compression pumps he tells me that he is having too much discomfort. He has been keeping his wraps on however. He is only been taking his fluid pills every other day because he states they are not really helping and he has an appointment with his primary care provider at the [facility] tomorrow. Subsequently the wound itself on the left lower extremity does seem to be greatly improved compared to previous. [name], [name] ([id number]) 03/25/2019 on evaluation today patient appears to be doing better with regard to his wounds on the bilateral lower extremities. The left is doing excellent the right is also doing better although both still do show some signs of open wounds noted at this point unfortunately. Fortunately there is no signs of active infection at this time. The patient also is not really having any significant pain which is good news. Unfortunately there was some confusion with the referral on vascular disease and as far as getting the patient scheduled there can be contacting him later today to do this fortunately we got this straightened out. 04/01/2019 on evaluation today patient  appears to be doing no fevers, chills, nausea, vomiting, or diarrhea. Excellent at this time with regard to his lower extremities. There does not appear to be any open wound at this point which is good news. Fortunately is also no signs of active infection at this time. Overall feel like the patient has done excellent with the compression the problem is every time we got him to this point and then subsequently go to using his own compression things just go right back to where they were. I am not sure how to address this we can try to get an appointment with vascular for 2 weeks now they have yet to call him. Obviously this has become frustrating for the patient as well. I think the issue has just been an honest error as far as scheduling is concerned but nonetheless still worn out the point where I am unsure of which direction we should take. 04/08/2019 on evaluation today patient actually appears to be doing well with regard to his lower extremities. There are no open wounds at this time and things seem to be managing quite nicely as far as the overall edema control is concerned. With that being said he does have his compression socks today for [place] to go ahead and reinitiate therapy in that manner at this point. He is going to be going for shoes to be measured on Wednesday and then coincidentally he will also be seeing vascular on Thursday. Overall I think this is good news and again I am hopeful that they will be able to do something for him to help prevent ongoing issues with edema control as well. No fevers, chills, nausea, vomiting, or diarrhea. 04/11/2019 on evaluation today patient actually appears to be doing poorly after just being discharged on Monday of this week. He had been experiencing issues with again blisters especially on the left lower extremity. With that being said he was completely healed and appeared to be doing great this past Monday. He then  subsequently has new blisters that  formed before his appointment with vascular this morning. He was also measured for shoes in the interim. With that being said we may have figured out what exactly is going on and why he continues to have issues like what we are seeing at this point. He takes his compression stockings off at nighttime and then he ends up having to sleep in his chair for 5-6 hours a night. He sleeps with his feet down he cannot really get him up in the recliner and therefore he is sleeping and the worst possible his position with his feet on the floor for that majority of the time. Again as I explained to him that is about one third at minimum at least one fourth of his day that he spending with his feet dangling down on the ground and the worst possible position they could be. I think this may be what is causing the issue. Subsequently I am leaning toward thinking that he may need a hospital bed in order to elevate his legs. We likely can have to coordinate this with his primary care provider at the [facility]. Readmission: 01/26/2021 this is a patient who presents for repeat evaluation here in the clinic although it is actually been couple of years since have seen him in fact it was December 2020 when I last saw him. Subsequently he never really healed but did end up being lost to follow-up. He tells me has been having issues ongoing with his lower extremities has bilateral lower extremity lymphedema no real significant or definitive open wounds but in general his lymphedema is way out of control. We were never able to refer him to lymphedema clinic simply due to the fact to be honest we were never able to get him completely healed. I do not see anyone with open wounds. The patient does have evidence of type 2 diabetes mellitus, lymphedema, chronic venous insufficiency, and hypertension. That really has not changed since his last evaluation. 02/09/2021 upon evaluation today patient appears to be doing a little better  in regard to his legs although he still having a tremendous amount of drainage especially on the left leg. Fortunately there does not appear to be any evidence of active infection. Of note when we looked into this further it appears that the patient did not have any absorptive dressing on it was just the 4-layer compression wrap. Nonetheless this is probably big part of the issue here. 10/10; he comes in today with 3 large areas on the upper right lower leg likely remanence of denuded blistering under his compression wraps. He has no other wounds on the right. On the left he has the denuded area on the left medial foot and ankle and on the left dorsal foot. Massive lymphedema in both feet dorsally. Using Zetuvit under compression We have increased home health visitation to twice a week to change the dressings and will change it once Electronic Signature(s) Signed: 02/15/2021 5:14:01 PM By: Linton Ham MD Entered By: Linton Ham on 02/15/2021 10:40:20 [name] ([id number]) -------------------------------------------------------------------------------- Physical Exam Details Patient Name: [name] Date of Service: 02/15/2021 10:15 AM Medical Record Number: [id number] Patient Account Number: [id number] Date of Birth/Sex: May 25, 1950 (70 y.o. M) Treating RN: [name] Primary Care Provider: Felipa Eth Other Clinician: Referring Provider: Felipa Eth Treating Provider/Extender: Tito Dine in Treatment: 2 Constitutional Sitting or standing Blood Pressure is within target range for patient.. Pulse regular and within target range for patient.Marland Kitchen Respirations  regular, non- labored and within target range.. Temperature is normal and within the target range for the patient.Marland Kitchen appears in no distress. Notes Wound exam; the patient has 3 new superficial areas on the upper right leg anteriorly. This looks like the remanent of blisters or perhaps skin tears. He has a  large areas of denuded skin on the left medial foot and ankle with excessive lymphedema still present. He does not have edema control in the left foot dorsally and the lower leg Electronic Signature(s) Signed: 02/15/2021 5:14:01 PM By: Linton Ham MD Entered By: Linton Ham on 02/15/2021 10:42:06 [name] ([id number]) -------------------------------------------------------------------------------- Physician Orders Details Patient Name: [name] Date of Service: 02/15/2021 10:15 AM Medical Record Number: [id number] Patient Account Number: [id number] Date of Birth/Sex: 1950-10-23 (70 y.o. M) Treating RN: [name] Primary Care Provider: Felipa Eth Other Clinician: Referring Provider: Felipa Eth Treating Provider/Extender: Tito Dine in Treatment: 2 Verbal / Phone Orders: No Diagnosis Coding Follow-up Appointments o Return Appointment in 1 week. [place] for wound care. May utilize formulary equivalent dressing for wound treatment orders unless otherwise specified. Home Health Nurse may visit PRN to address patientos wound care needs. - frequency 3 times per week - patient will be seen once at wound center - home health to see patient 2 times per week on Wed and friday Bathing/ Shower/ Hygiene o May shower; gently cleanse wound with antibacterial soap, rinse and pat dry prior to dressing wounds Edema Control - Lymphedema / Segmental Compressive Device / Other Bilateral Lower Extremities o Optional: One layer of unna paste to top of compression wrap (to act as an anchor). o 4 Layer Compression System Lymphedema. - bi lat, apply zetuvits/ or formulary for extra absorbant dressing around ankles and foot area, 3 times per week o Elevate, Exercise Daily and Avoid Standing for Long Periods of Time. o Elevate legs to the level of the heart and pump ankles as often as possible o Elevate leg(s) parallel to the  floor when sitting. Off-Loading o Open toe surgical shoe Wound Treatment Wound #12 - Lower Leg Wound Laterality: Right, Anterior Cleanser: Wound Cleanser (DME) (Generic) 3 x Per Week/30 Days Discharge Instructions: Wash your hands with soap and water. Remove old dressing, discard into plastic bag and place into trash. Cleanse the wound with Wound Cleanser prior to applying a clean dressing using gauze sponges, not tissues or cotton balls. Do not scrub or use excessive force. Pat dry using gauze sponges, not tissue or cotton balls. Primary Dressing: Xeroform 4x4-HBD (in/in) 3 x Per Week/30 Days Discharge Instructions: Apply Xeroform 4x4-HBD (in/in) as directed Secondary Dressing: ABD Pad 5x9 (in/in) 3 x Per Week/30 Days Discharge Instructions: Cover with ABD pad Compression Wrap: Medichoice 4 layer Compression System, 35-40 mmHG 3 x Per Week/30 Days Discharge Instructions: Apply multi-layer wrap as directed. Wound #13 - Lower Leg Wound Laterality: Right, Medial Cleanser: Wound Cleanser (DME) (Generic) 3 x Per Week/30 Days Discharge Instructions: Wash your hands with soap and water. Remove old dressing, discard into plastic bag and place into trash. Cleanse the wound with Wound Cleanser prior to applying a clean dressing using gauze sponges, not tissues or cotton balls. Do not scrub or use excessive force. Pat dry using gauze sponges, not tissue or cotton balls. Primary Dressing: Xeroform 4x4-HBD (in/in) 3 x Per Week/30 Days Discharge Instructions: Apply Xeroform 4x4-HBD (in/in) as directed Secondary Dressing: ABD Pad 5x9 (in/in) 3 x Per Week/30 Days Discharge Instructions: Cover with ABD pad  Compression Wrap: Medichoice 4 layer Compression System, 35-40 mmHG 3 x Per Week/30 Days Discharge Instructions: Apply multi-layer wrap as directed. Wound #14 - Lower Leg Wound Laterality: Right, Posterior [name], [name] ([id number]) Cleanser: Wound Cleanser (DME) (Generic) 3 x Per Week/30  Days Discharge Instructions: Wash your hands with soap and water. Remove old dressing, discard into plastic bag and place into trash. Cleanse the wound with Wound Cleanser prior to applying a clean dressing using gauze sponges, not tissues or cotton balls. Do not scrub or use excessive force. Pat dry using gauze sponges, not tissue or cotton balls. Primary Dressing: Xeroform 4x4-HBD (in/in) 3 x Per Week/30 Days Discharge Instructions: Apply Xeroform 4x4-HBD (in/in) as directed Secondary Dressing: ABD Pad 5x9 (in/in) 3 x Per Week/30 Days Discharge Instructions: Cover with ABD pad Compression Wrap: Medichoice 4 layer Compression System, 35-40 mmHG 3 x Per Week/30 Days Discharge Instructions: Apply multi-layer wrap as directed. Electronic Signature(s) Signed: 02/15/2021 5:14:01 PM By: Linton Ham MD Signed: 02/18/2021 1:01:44 PM By: [name] RN Entered By: [name] on 02/15/2021 10:38:46 [name] ([id number]) -------------------------------------------------------------------------------- Problem List Details Patient Name: [name], [name] Date of Service: 02/15/2021 10:15 AM Medical Record Number: [id number] Patient Account Number: [id number] Date of Birth/Sex: 1950-06-13 (70 y.o. M) Treating RN: [name] Primary Care Provider: Felipa Eth Other Clinician: Referring Provider: Felipa Eth Treating Provider/Extender: Tito Dine in Treatment: 2 Active Problems ICD-10 Encounter Code Description Active Date MDM Diagnosis E11.622 Type 2 diabetes mellitus with other skin ulcer 01/26/2021 No Yes I89.0 Lymphedema, not elsewhere classified 01/26/2021 No Yes I87.2 Venous insufficiency (chronic) (peripheral) 01/26/2021 No Yes L97.822 Non-pressure chronic ulcer of other part of left lower leg with fat layer 01/26/2021 No Yes exposed L97.512 Non-pressure chronic ulcer of other part of right foot with fat layer 01/26/2021 No Yes exposed [place] (primary)  hypertension 01/26/2021 No Yes L97.811 Non-pressure chronic ulcer of other part of right lower leg limited to 02/15/2021 No Yes breakdown of skin Inactive Problems Resolved Problems Electronic Signature(s) Signed: 02/15/2021 5:14:01 PM By: Linton Ham MD Entered By: Linton Ham on 02/15/2021 10:44:50 [name] ([id number]) -------------------------------------------------------------------------------- Progress Note Details Patient Name: [name] Date of Service: 02/15/2021 10:15 AM Medical Record Number: [id number] Patient Account Number: [id number] Date of Birth/Sex: 02-Oct-1950 (70 y.o. M) Treating RN: [name] Primary Care Provider: Felipa Eth Other Clinician: Referring Provider: Felipa Eth Treating Provider/Extender: Tito Dine in Treatment: 2 Subjective History of Present Illness (HPI) 70 year old male who presented to the ER with bilateral lower extremity blisters which had started last week. he has a past medical history of leukemia, diabetes mellitus, hypertension, edema of both lower extremities, his recurrent skin infections, peripheral vascular disease, coronary artery disease, congestive heart failure and peripheral neuropathy. in the ER he was given Rocephin and put on Silvadene cream. he was put on oral doxycycline and was asked to follow-up with the [facility]. His last hemoglobin A1c was 6.6 in December and he checks his blood sugar once a week. He does not have any physicians outside the [place] system. He does not recall any vascular duplex studies done either for arterial or venous disease but was told to wear compression stockings which he does not use 05/30/2016 -- we have not yet received any of his notes from the [facility] hospital system and his arterial and venous duplex studies are scheduled here in [place] around mid February. We are unable to have his insurance accepted by home health agencies and hence he is getting  dressings only once a week. 06/06/16 -- -- I received a call from the patient's PCP at the [facility] and spoke to Dr. Garvin Fila, phone number [phone] and fax number [phone]. She confirmed that no vascular testing was done over the last 5 years and she would be happy to do them if the patient did want them to be done at the [place] and we could fax him a request. Readmission: 70 year old male seen by as in February of this year and was referred to vein and vascular for studies and opinion from the vascular surgeons. The patient returns today with a fresh problem having had blisters on his left lower extremity which have been there for about 5 days and he clearly states that he has been wearing his compression stockings as advised though he could not read the moderate compression and has been wearing light compression. Review of his electronic medical records note that he had lower extremity arterial duplex examination done on 06/23/2016 which showed no hemodynamically significant stenosis in the bilateral lower extremity arterial system. He also had a lower extremity venous reflux examination done on 07/07/2016 and it was noted that he had venous incompetence in the right great saphenous vein and bilateral common femoral veins. Patient was seen by Dr. Tamala Julian on the same day and for some reason his notes do not reflect the venous studies or the arterial studies and he recommended patient do a venous duplex ultrasound to look for reflux and return to see him.he would also consider a lymph pump if required. The patient was told that his workup was normal and hence the patient canceled his follow-up appointment. 02/03/17 on evaluation today patient left medial lower extremity blister appears to be doing about the same. It is still continuing to drain and there's still the blistered skin covering the wound bed which is making it difficult for the alternate to do its job. Fortunately there is  no evidence of cellulitis. No fevers chills noted. Patient states in general he is not having any significant discomfort. Patient's lower extremity arterial duplex exam revealed that patient was hemodynamically stable with no evidence of stenosis in regard to the bilateral lower extremities. The lower extremity venous reflux exam revealed the patient had venous incontinence noted in the right greater saphenous and bilateral common femoral vein. There is no evidence of deep or superficial vein thrombosis in the bilateral lower extremities. Readmission: 11/12/18 Patient presents for evaluation our clinic today concerning issues that he is having with his left lower extremity. He tells me that a couple weeks ago he began developing blisters on the left lower extremity along with increased swelling. He typically wears his compression stockings on a regular basis is previously been evaluated both here as well is with vascular surgery they would recommend lymphedema pumps but unfortunately that somehow fell through and he never heard anything back from that. Nonetheless I think lymphedema pumps would be beneficial for this patient. He does have a history of hypertension and diabetes. Obviously the chronic venous stasis and lymphedema as well. At this point the blisters have been given in more trouble he states sometimes when the blisters openings able to clean it down with alcohol and it will dry out and do well. Unfortunately that has not been the case this time. He is having some discomfort although this mean these with cleaning the areas he doesn't have discomfort just on a regular basis. He has not been able to wear his compression stockings since  the blisters arose due to the fact that of course it will drain into the socks causing additional issues and he didn't have any way to wrap this otherwise. He has increased to taking his Lasix every day instead of every other day. He sees his primary care  provider later this month as well. No fevers, chills, nausea, or vomiting noted at this time. 11/19/18-Patient returns at 1 week, per intake RN the amount of seepage into the compression wraps was definitely improved, overall all the wounds are measuring smaller but continuing silver alginate to the wounds as primary dressing 11/26/18 on evaluation today patient appears to be doing quite well in regard to his left lower [place] ulcers. In fact of the areas that were noted initially he only has two regions still open. There is no evidence of active infection at this time. He still is not heard anything from the company regarding lymphedema pumps as of yet. Again as previously seen vascular they have not recommended any surgical intervention. 12/03/2018 on evaluation today patient actually appears to be doing quite well with regard to his lower extremity ulcers. In fact most of the areas appear to be healed the one spot which does not seem to be completely healed I am unsure of whether or not this is really draining that much but nonetheless there does not appear to be any signs of infection or significant drainage at this point. There is no sign of fever, chills, nausea, vomiting, or diarrhea. Overall I am pleased with how things have progressed I think is very close to being able to transition to his home compression stockings. [name], [name] ([id number]) 12/10/2018 upon evaluation today patient appears to be doing quite well with regard to his left lower extremity. He has been tolerating the dressing changes without complication. Fortunately there is no signs of active infection at this time. He appears after thorough evaluation of his leg to only have 1 small area that remains open at this point everything else appears to be almost completely closed. He still have significant swelling of the left lower extremity. We had discussed discussing this with his primary care provider he is not able to see her in  person they were at the [facility] and right now the [place] is not seeing patients on site. According to the patient anyway. Subsequently he did speak with her apparently and his primary care provider feels that he may likely have a DVT. With that being said she has not seen his leg she is just going off of his history. Nonetheless that is a concern that the patient now has as well and while I do not feel the DVT is likely we can definitely ensure that that is not the case I will go ahead and see about putting that order in today. Nonetheless otherwise I am in a recommend that we continue with the current wound care measures including the compression therapy most likely. We just need to ensure that his leg is indeed free of any DVTs. 12/17/2018 on evaluation today patient actually appears to be completely healed today. He does have 2 very small areas of blistering although this is not anything too significant at this point which is good news. With that being said I am in agreement with the fact that I think he is completely healed at this point. He does want to get back into his compression stocking. The good news is we have gotten approval from insurance for his lymphedema pumps we received  a letter since last saw him last week. The other good news is his study did come back and showed no evidence of a DVT. 12/20/2018 on evaluation today patient presents for follow-up concerning his ongoing issues with his left lower extremity. He was actually discharged last Friday and did fairly well until he states blisters opened this morning. He tells me he has been wearing his compression stocking although he has a hard time getting this on. There does not appear to be any signs of active infection at this time. No fevers, chills, nausea, vomiting, or diarrhea. 12/27/2018 on evaluation today patient appears to be doing very well with regard to his swelling of the left lower extremity the 4 layer compression wrap  seems to have been beneficial for him. Fortunately there is no signs of active infection at this time. Patient has been tolerating the compression wrap without complication and his foot swelling in particular appears to be greatly improved. He does still have a wound on the lateral portion of his left leg I believe this is more of a blister that has now reopened. 01/03/2019 on evaluation today patient actually appears to be doing excellent in regard to his left lower extremity. He did receive his compression pumps and is actually use this 7 times since he was last here in the office. On top of the compression wrap he is now roughly 3 cm better at the calf and 2 cm better at the ankle he also states that his foot seem to go an issue better without even having to use a shoe horn. Obviously I think this is all evidence that he is doing excellent in this regard. The other good news is he does not appear to have anything open today as far as wounds are concerned. 01/15/2019 on evaluation today patient appears to be doing more poorly yet again with regard to his left lower extremity. He has developed new wounds again after being discharged just recently. Unfortunately this continues to be the case that he will heal and then have subsequent new wounds. The last time I was hopeful that he may not end up coming back too quickly especially since he states he has been using his lymphedema pumps along with wearing his compression. Nonetheless he had a blister on the back of his leg that popped up on the left and this has opened up into an ulceration it is quite painful. 01/22/19 on evaluation today patient actually appears to be doing well with regard to his wound on the left lower extremity. He's been tolerating the dressing changes without complication including the compression wrap in the wound appears to be significantly smaller today which is great news. Overall very pleased in this regard. 01/29/2019 on  evaluation today patient appears to be doing well with regard to his left posterior lower extremity ulcer. He has been tolerating the dressing changes without complication. This is not completely healed but is getting much closer. We did order a Farrow wrap 4000 for him he has received this and has it with him today although I am not sure we are quite ready to start him on that as of yet. We are very close. 02/05/2019 on evaluation today patient actually appears to be doing quite well with regard to his left posterior lower extremity ulcer. He still has a very tiny opening remaining but the fortunate thing is he seems to be healing quite nicely. He also did get his Farrow wrap which I am hoping will  help with his edema control as well at home. Fortunately there is no evidence of active infection. 02/12/2019 patient and fortunately appears to be doing poorly in regard to his wounds of the left lower extremity. He was very close to healing therefore we attempted to use his Velcro compression wraps continuing with lymphedema pumps at home. Unfortunately that does not seem to have done very well for him. He tells me that he wore them all the time but again I am not sure why if that is the case that he is having such significant edema. He is still on his fluid pills as well. With that being said there is no obvious sign of infection although I do wonder about the possibility of infection at this time as well. 02/19/2019 unfortunately upon evaluation today patient appears to be doing more poorly with regard to his left lower extremity. He is not showing signs of significant improvement and I think the biggest issue here is that he does have an infection that appears to likely be Pseudomonas. That is based on the blue-green drainage that were noted at this time. Unfortunately the antibiotic that has been on is not going to take care of this at all. I think they will get a need to switch him to either Levaquin  or Cipro and this was discussed with the patient. 02/26/2019 on evaluation today patient's lower extremity on the left appears to be doing significantly better as compared to last evaluation. Fortunately there is no signs of active infection at this time. He has been tolerating the compression wrap without complication in fact he made it the whole week at this point. He is showing signs of excellent improvement I am very happy in this regard. With that being said he is having some issues with infection we did review the results of his culture which I noted today. He did have a positive finding for Enterobacter as well as Alcaligenes faecalis. Fortunately the Levaquin that I placed him on will work for both which is great news. There is no signs of systemic infection at this point. 10/30; left posterior leg wound in the setting of very significant edema and what looks like chronic venous inflammation. He has compression pumps but does not use them. We have been using 3 layer compression. Silver alginate to the wound as the primary dressing 03/18/2019 on evaluation today patient appears to be doing a little better compared to last time I saw him. He really has not been using his compression pumps he tells me that he is having too much discomfort. He has been keeping his wraps on however. He is only been taking his fluid pills every other day because he states they are not really helping and he has an appointment with his primary care provider at the [facility] tomorrow. Subsequently the wound itself on the left lower extremity does seem to be greatly improved compared to previous. 03/25/2019 on evaluation today patient appears to be doing better with regard to his wounds on the bilateral lower extremities. The left is doing excellent the right is also doing better although both still do show some signs of open wounds noted at this point unfortunately. Fortunately there is no signs of active infection at this  time. The patient also is not really having any significant pain which is good news. Unfortunately there was some confusion with the referral on vascular disease and as far as getting the patient scheduled there can be contacting him later today to  do this [name], [name] ([id number]) fortunately we got this straightened out. 04/01/2019 on evaluation today patient appears to be doing no fevers, chills, nausea, vomiting, or diarrhea. Excellent at this time with regard to his lower extremities. There does not appear to be any open wound at this point which is good news. Fortunately is also no signs of active infection at this time. Overall feel like the patient has done excellent with the compression the problem is every time we got him to this point and then subsequently go to using his own compression things just go right back to where they were. I am not sure how to address this we can try to get an appointment with vascular for 2 weeks now they have yet to call him. Obviously this has become frustrating for the patient as well. I think the issue has just been an honest error as far as scheduling is concerned but nonetheless still worn out the point where I am unsure of which direction we should take. 04/08/2019 on evaluation today patient actually appears to be doing well with regard to his lower extremities. There are no open wounds at this time and things seem to be managing quite nicely as far as the overall edema control is concerned. With that being said he does have his compression socks today for [place] to go ahead and reinitiate therapy in that manner at this point. He is going to be going for shoes to be measured on Wednesday and then coincidentally he will also be seeing vascular on Thursday. Overall I think this is good news and again I am hopeful that they will be able to do something for him to help prevent ongoing issues with edema control as well. No fevers, chills, nausea, vomiting,  or diarrhea. 04/11/2019 on evaluation today patient actually appears to be doing poorly after just being discharged on Monday of this week. He had been experiencing issues with again blisters especially on the left lower extremity. With that being said he was completely healed and appeared to be doing great this past Monday. He then subsequently has new blisters that formed before his appointment with vascular this morning. He was also measured for shoes in the interim. With that being said we may have figured out what exactly is going on and why he continues to have issues like what we are seeing at this point. He takes his compression stockings off at nighttime and then he ends up having to sleep in his chair for 5-6 hours a night. He sleeps with his feet down he cannot really get him up in the recliner and therefore he is sleeping and the worst possible his position with his feet on the floor for that majority of the time. Again as I explained to him that is about one third at minimum at least one fourth of his day that he spending with his feet dangling down on the ground and the worst possible position they could be. I think this may be what is causing the issue. Subsequently I am leaning toward thinking that he may need a hospital bed in order to elevate his legs. We likely can have to coordinate this with his primary care provider at the [name]. Readmission: 01/26/2021 this is a patient who presents for repeat evaluation here in the clinic although it is actually been couple of years since have seen him in fact it was December 2020 when I last saw him. Subsequently he never really healed but did  end up being lost to follow-up. He tells me has been having issues ongoing with his lower extremities has bilateral lower extremity lymphedema no real significant or definitive open wounds but in general his lymphedema is way out of control. We were never able to refer him to lymphedema clinic  simply due to the fact to be honest we were never able to get him completely healed. I do not see anyone with open wounds. The patient does have evidence of type 2 diabetes mellitus, lymphedema, chronic venous insufficiency, and hypertension. That really has not changed since his last evaluation. 02/09/2021 upon evaluation today patient appears to be doing a little better in regard to his legs although he still having a tremendous amount of drainage especially on the left leg. Fortunately there does not appear to be any evidence of active infection. Of note when we looked into this further it appears that the patient did not have any absorptive dressing on it was just the 4-layer compression wrap. Nonetheless this is probably big part of the issue here. 10/10; he comes in today with 3 large areas on the upper right lower leg likely remanence of denuded blistering under his compression wraps. He has no other wounds on the right. On the left he has the denuded area on the left medial foot and ankle and on the left dorsal foot. Massive lymphedema in both feet dorsally. Using Zetuvit under compression We have increased home health visitation to twice a week to change the dressings and will change it once Objective Constitutional Sitting or standing Blood Pressure is within target range for patient.. Pulse regular and within target range for patient.Marland Kitchen Respirations regular, non- labored and within target range.. Temperature is normal and within the target range for the patient.Marland Kitchen appears in no distress. Vitals Time Taken: 10:17 AM, Height: 73 in, Weight: 312 lbs, BMI: 41.2, Temperature: 98.2 F, Pulse: 68 bpm, Respiratory Rate: 18 breaths/min, Blood Pressure: 123/59 mmHg. General Notes: Wound exam; the patient has 3 new superficial areas on the upper right leg anteriorly. This looks like the remanent of blisters or perhaps skin tears. He has a large areas of denuded skin on the left medial foot and ankle  with excessive lymphedema still present. He does not have edema control in the left foot dorsally and the lower leg Integumentary (Hair, Skin) Wound #12 status is Open. Original cause of wound was Gradually Appeared. The date acquired was: 02/12/2021. The wound is located on the Right,Anterior Lower Leg. The wound measures 4.2cm length x 1.7cm width x 0.1cm depth; 5.608cm^2 area and 0.561cm^3 volume. There is no tunneling or undermining noted. There is a medium amount of serosanguineous drainage noted. There is no granulation within the wound bed. There is no necrotic tissue within the wound bed. Pegram, [name] ([id number]) Wound #13 status is Open. Original cause of wound was Gradually Appeared. The date acquired was: 02/12/2021. The wound is located on the Right,Medial Lower Leg. The wound measures 7cm length x 3.2cm width x 0.1cm depth; 17.593cm^2 area and 1.759cm^3 volume. There is no tunneling or undermining noted. There is a medium amount of serosanguineous drainage noted. There is no granulation within the wound bed. There is no necrotic tissue within the wound bed. Wound #14 status is Open. Original cause of wound was Gradually Appeared. The date acquired was: 02/12/2021. The wound is located on the Right,Posterior Lower Leg. The wound measures 3cm length x 3.5cm width x 0.1cm depth; 8.247cm^2 area and 0.825cm^3 volume. There is no tunneling  or undermining noted. There is a medium amount of serosanguineous drainage noted. There is no granulation within the wound bed. There is no necrotic tissue within the wound bed. 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 There was a Four Layer Compression Therapy Procedure by [name], RN. Post procedure Diagnosis Wound #12: Same as Pre-Procedure Wound #13 Pre-procedure diagnosis of Wound #13 is a Diabetic Wound/Ulcer of the Lower Extremity located on the Right,Medial Lower Leg . There was a Four Layer Compression Therapy Procedure by [name], RN. Post procedure Diagnosis Wound #13: Same as Pre-Procedure Wound #14 Pre-procedure diagnosis of Wound #14 is a Diabetic Wound/Ulcer of the Lower Extremity located on the Right,Posterior Lower Leg . There was a Four Layer Compression Therapy Procedure by [name], RN. Post procedure Diagnosis Wound #14: Same as Pre-Procedure There was a Four Layer Compression Therapy Procedure by [name], RN. Post procedure Diagnosis Wound #: Same as Pre-Procedure Plan Follow-up Appointments: Return Appointment in 1 week. Home Health: [facility] for wound care. May utilize formulary equivalent dressing for wound treatment orders unless otherwise specified. Home Health Nurse may visit [facility] to address patient s wound care needs. - frequency 3 times per week - patient will be seen once at wound center - home health to see patient 2 times per week on Wed and friday Bathing/ Shower/ Hygiene: May shower; gently cleanse wound with antibacterial soap, rinse and pat dry prior to dressing wounds Edema Control - Lymphedema / Segmental Compressive Device / Other: Optional: One layer of unna paste to top of compression wrap (to act as an anchor). 4 Layer Compression System Lymphedema. - bi lat, apply zetuvits/ or formulary for extra absorbant dressing around ankles and foot area, 3 times per week Elevate, Exercise Daily and Avoid Standing for Long Periods of Time. Elevate legs to the level of the heart and pump ankles as often as  possible Elevate leg(s) parallel to the floor when sitting. [name], [name] ([id number]) Off-Loading: Open toe surgical shoe WOUND #12: - Lower Leg Wound Laterality: Right, Anterior Cleanser: Wound Cleanser (DME) (Generic) 3 x Per Week/30 Days Discharge Instructions: Wash your hands with soap and water. Remove old dressing, discard into plastic bag and place into trash. Cleanse the wound with Wound Cleanser prior to applying a clean dressing using gauze sponges, not tissues or cotton balls. Do not scrub or use excessive force. Pat dry using gauze sponges, not tissue or cotton balls. Primary Dressing: Xeroform 4x4-HBD (in/in) 3 x Per Week/30 Days Discharge Instructions: Apply Xeroform 4x4-HBD (in/in) as directed Secondary Dressing: ABD Pad 5x9 (in/in) 3 x Per Week/30 Days Discharge Instructions: Cover with ABD pad Compression Wrap: Medichoice 4 layer Compression System, 35-40 mmHG 3 x Per Week/30 Days Discharge Instructions: Apply multi-layer wrap as directed. WOUND #13: - Lower Leg Wound Laterality: Right, Medial Cleanser: Wound Cleanser (DME) (Generic) 3 x Per Week/30 Days Discharge Instructions: Wash your hands with soap and water. Remove old dressing, discard into plastic bag and place into trash. Cleanse the wound with Wound Cleanser prior to applying a clean dressing using gauze sponges, not tissues or cotton  balls. Do not scrub or use excessive force. Pat dry using gauze sponges, not tissue or cotton balls. Primary Dressing: Xeroform 4x4-HBD (in/in) 3 x Per Week/30 Days Discharge Instructions: Apply Xeroform 4x4-HBD (in/in) as directed Secondary Dressing: ABD Pad 5x9 (in/in) 3 x Per Week/30 Days Discharge Instructions: Cover with ABD pad Compression Wrap: Medichoice 4 layer Compression System, 35-40 mmHG 3 x Per Week/30 Days Discharge Instructions: Apply multi-layer wrap as directed. WOUND #14: - Lower Leg Wound Laterality: Right, Posterior Cleanser: Wound Cleanser (DME) (Generic) 3  x Per Week/30 Days Discharge Instructions: Wash your hands with soap and water. Remove old dressing, discard into plastic bag and place into trash. Cleanse the wound with Wound Cleanser prior to applying a clean dressing using gauze sponges, not tissues or cotton balls. Do not scrub or use excessive force. Pat dry using gauze sponges, not tissue or cotton balls. Primary Dressing: Xeroform 4x4-HBD (in/in) 3 x Per Week/30 Days Discharge Instructions: Apply Xeroform 4x4-HBD (in/in) as directed Secondary Dressing: ABD Pad 5x9 (in/in) 3 x Per Week/30 Days Discharge Instructions: Cover with ABD pad Compression Wrap: Medichoice 4 layer Compression System, 35-40 mmHG 3 x Per Week/30 Days Discharge Instructions: Apply multi-layer wrap as directed. #1 we are using Zetuvit under 4-layer compression. 2. He does not have compression pumps 3. We will apply Xeroform to the superficial areas on the right upper leg under compression Electronic Signature(s) Signed: 02/15/2021 5:14:01 PM By: Linton Ham MD Entered By: Linton Ham on 02/15/2021 10:44:10 [name] ([id number]) -------------------------------------------------------------------------------- SuperBill Details Patient Name: [name] Date of Service: 02/15/2021 Medical Record Number: [id number] Patient Account Number: [id number] Date of Birth/Sex: [birth year] (70 y.o. M) Treating RN: [name] Primary Care Provider: Felipa Eth Other Clinician: Referring Provider: Felipa Eth Treating Provider/Extender: Tito Dine in Treatment: 2 Diagnosis Coding ICD-10 Codes Code Description [id number] Type 2 diabetes mellitus with other skin ulcer I89.0 Lymphedema, not elsewhere classified I87.2 Venous insufficiency (chronic) (peripheral) L97.822 Non-pressure chronic ulcer of other part of left lower leg with fat layer exposed L97.512 Non-pressure chronic ulcer of other part of right foot with fat layer exposed I10  Essential (primary) hypertension L97.811 Non-pressure chronic ulcer of other part of right lower leg limited to breakdown of skin Facility Procedures CPT4: Description Modifier Quantity Code [id number] BILATERAL: Application of multi-layer venous compression system; leg (below knee), including 1 ankle and foot. Physician Procedures CPT4 Code: [id number] Description: 15726 - WC PHYS LEVEL 3 - EST PT Modifier: Quantity: 1 CPT4 Code: Description: ICD-10 Diagnosis Description L97.822 Non-pressure chronic ulcer of other part of left lower leg with fat layer L97.811 Non-pressure chronic ulcer of other part of right lower leg limited to bre I89.0 Lymphedema, not elsewhere classified Modifier: exposed akdown of skin Quantity: Electronic Signature(s) Signed: 02/15/2021 5:14:01 PM By: Linton Ham MD Signed: 02/18/2021 1:01:44 PM By: [name] RN Entered By: [name] on 02/15/2021 10:55:29

## 2021-02-18 NOTE — Progress Notes (Signed)
LANSON, RANDLE ([id number]) Visit Report for 02/15/2021 Arrival Information Details Patient Name: [name], [name] Date of Service: 02/15/2021 10:15 AM Medical Record Number: [id number] Patient Account Number: [id number] Date of Birth/Sex: 05/13/1950 (70 y.o. M) Treating RN: [name] Primary Care Jhordyn Hoopingarner: Felipa Eth Other Clinician: Referring Mayrani Khamis: Felipa Eth Treating Nicholaos Schippers/Extender: Tito Dine in Treatment: 2 Visit Information History Since Last Visit All ordered tests and consults were completed: No Patient Arrived: [name] Added or deleted any medications: No Arrival Time: 10:17 Any new allergies or adverse reactions: No Accompanied By: self Had a fall or experienced change in No Transfer Assistance: None activities of daily living that may affect Patient Identification Verified: Yes risk of falls: Secondary Verification Process Completed: Yes Signs or symptoms of abuse/neglect since last visito No Patient Requires Transmission-Based Precautions: No Hospitalized since last visit: No Patient Has Alerts: No Implantable device outside of the clinic excluding No cellular tissue based products placed in the center since last visit: Has Dressing in Place as Prescribed: Yes Has Compression in Place as Prescribed: Yes Pain Present Now: No Electronic Signature(s) Signed: 02/18/2021 1:01:44 PM By: [name] RN Entered By: [name] on 02/15/2021 10:17:35 [name] ([id number]) -------------------------------------------------------------------------------- Clinic Level of Care Assessment Details Patient Name: [name] Date of Service: 02/15/2021 10:15 AM Medical Record Number: [id number] Patient Account Number: [id number] Date of Birth/Sex: 1951-01-17 (70 y.o. M) Treating RN: [name] Primary Care [name]: Felipa Eth Other Clinician: Referring Raynor Calcaterra: Felipa Eth Treating [name]/Extender: Tito Dine in  Treatment: 2 Clinic Level of Care Assessment Items TOOL 1 Quantity Score []  - Use when EandM and Procedure is performed on INITIAL visit 0 ASSESSMENTS - Nursing Assessment / Reassessment []  - General Physical Exam (combine w/ comprehensive assessment (listed just below) when performed on new 0 pt. evals) []  - 0 Comprehensive Assessment (HX, ROS, Risk Assessments, Wounds Hx, etc.) ASSESSMENTS - Wound and Skin Assessment / Reassessment []  - Dermatologic / Skin Assessment (not related to wound area) 0 ASSESSMENTS - Ostomy and/or Continence Assessment and Care []  - Incontinence Assessment and Management 0 []  - 0 Ostomy Care Assessment and Management (repouching, etc.) PROCESS - Coordination of Care []  - Simple Patient / Family Education for ongoing care 0 []  - 0 Complex (extensive) Patient / Family Education for ongoing care []  - 0 Staff obtains Programmer, systems, Records, Test Results / Process Orders []  - 0 Staff telephones HHA, Nursing Homes / Clarify orders / etc []  - 0 Routine Transfer to another Facility (non-emergent condition) []  - 0 Routine Hospital Admission (non-emergent condition) []  - 0 New Admissions / Biomedical engineer / Ordering NPWT, Apligraf, etc. []  - 0 Emergency Hospital Admission (emergent condition) PROCESS - Special Needs []  - Pediatric / Minor Patient Management 0 []  - 0 Isolation Patient Management []  - 0 Hearing / Language / Visual special needs []  - 0 Assessment of Community assistance (transportation, D/C planning, etc.) []  - 0 Additional assistance / Altered mentation []  - 0 Support Surface(s) Assessment (bed, cushion, seat, etc.) INTERVENTIONS - Miscellaneous []  - External ear exam 0 []  - 0 Patient Transfer (multiple staff / Civil Service fast streamer / Similar devices) []  - 0 Simple Staple / Suture removal (25 or less) []  - 0 Complex Staple / Suture removal (26 or more) []  - 0 Hypo/Hyperglycemic Management (do not check if billed separately) []  -  0 Ankle / Brachial Index (ABI) - do not check if billed separately Has the patient been seen at the hospital within the last three years:  Yes Total Score: 0 Level Of Care: ____ [name] ([id number]) Electronic Signature(s) Signed: 02/18/2021 1:01:44 PM By: [name] RN Entered By: [name] on 02/15/2021 10:55:18 [name] ([id number]) -------------------------------------------------------------------------------- Compression Therapy Details Patient Name: [name] Date of Service: 02/15/2021 10:15 AM Medical Record Number: [id number] Patient Account Number: [id number] Date of Birth/Sex: 05-Mar-1951 (70 y.o. M) Treating RN: [name] Primary Care Chasya Zenz: Felipa Eth Other Clinician: Referring Ashlyne Olenick: Felipa Eth Treating Shelley Cocke/Extender: Tito Dine in Treatment: 2 Compression Therapy Performed for Wound Assessment: Wound #12 Right,Anterior Lower Leg Performed By: Clinician [name], RN Compression Type: Four Layer Post Procedure Diagnosis Same as Pre-procedure Electronic Signature(s) Signed: 02/18/2021 1:01:44 PM By: [name] RN Entered By: [name] on 02/15/2021 10:36:46 [name] ([id number]) -------------------------------------------------------------------------------- Compression Therapy Details Patient Name: [name] Date of Service: 02/15/2021 10:15 AM Medical Record Number: [id number] Patient Account Number: [id number] Date of Birth/Sex: [birth year] (70 y.o. M) Treating RN: [name] Primary Care [name]: Felipa Eth Other Clinician: Referring Ceejay Kegley: Felipa Eth Treating Dottie Vaquerano/Extender: Tito Dine in Treatment: 2 Compression Therapy Performed for Wound Assessment: Wound #13 Right,Medial Lower Leg Performed By: Clinician [name], RN Compression Type: Four Layer Post Procedure Diagnosis Same as Pre-procedure Electronic Signature(s) Signed: 02/18/2021 1:01:44 PM  By: [name] RN Entered By: [name] on 02/15/2021 10:36:46 [name] ([id number]) -------------------------------------------------------------------------------- Compression Therapy Details Patient Name: [name] Date of Service: 02/15/2021 10:15 AM Medical Record Number: [id number] Patient Account Number: [id number] Date of Birth/Sex: 10-16-1950 (70 y.o. M) Treating RN: [name] Primary Care Katiya Fike: Felipa Eth Other Clinician: Referring Audry Pecina: Felipa Eth Treating Carmina Walle/Extender: Tito Dine in Treatment: 2 Compression Therapy Performed for Wound Assessment: Wound #14 Right,Posterior Lower Leg Performed By: Clinician [name], RN Compression Type: Four Layer Post Procedure Diagnosis Same as Pre-procedure Electronic Signature(s) Signed: 02/18/2021 1:01:44 PM By: [name] RN Entered By: [name] on 02/15/2021 10:36:46 [name] ([id number]) -------------------------------------------------------------------------------- Compression Therapy Details Patient Name: [name] Date of Service: 02/15/2021 10:15 AM Medical Record Number: [id number] Patient Account Number: [id number] Date of Birth/Sex: 12/20/1950 (70 y.o. M) Treating RN: [name] Primary Care Poonam Woehrle: Felipa Eth Other Clinician: Referring Jerald Hennington: Felipa Eth Treating Rosibel Giacobbe/Extender: Tito Dine in Treatment: 2 Compression Therapy Performed for Wound Assessment: NonWound Condition Lymphedema - Left Leg Performed By: Clinician [name], RN Compression Type: Four Layer Post Procedure Diagnosis Same as Pre-procedure Electronic Signature(s) Signed: 02/18/2021 1:01:44 PM By: [name] RN Entered By: [name] on 02/15/2021 10:37:05 [name] ([id number]) -------------------------------------------------------------------------------- Encounter Discharge Information Details Patient Name: [name] Date of  Service: 02/15/2021 10:15 AM Medical Record Number: [id number] Patient Account Number: [id number] Date of Birth/Sex: 1951-04-14 (70 y.o. M) Treating RN: [name] Primary Care Jennice Renegar: Felipa Eth Other Clinician: Referring Mayur Duman: Felipa Eth Treating Rayley Gao/Extender: Tito Dine in Treatment: 2 Encounter Discharge Information Items Discharge Condition: Stable Ambulatory Status: Cane Discharge Destination: Home Transportation: Private Auto Accompanied By: self Schedule Follow-up Appointment: Yes Clinical Summary of Care: Patient Declined Electronic Signature(s) Signed: 02/18/2021 1:01:44 PM By: [name] RN Entered By: [name] on 02/15/2021 10:56:17 [name] ([id number]) -------------------------------------------------------------------------------- Lower Extremity Assessment Details Patient Name: [name] Date of Service: 02/15/2021 10:15 AM Medical Record Number: [id number] Patient Account Number: [id number] Date of Birth/Sex: 06-25-1950 (70 y.o. M) Treating RN: [name] Primary Care Ragnar Waas: Felipa Eth Other Clinician: Referring Zakeya Junker: Felipa Eth Treating Loudon Krakow/Extender: Ricard Dillon Weeks in Treatment: 2 Edema Assessment Assessed: [Left: No] [Right: No] Edema: [Left:  Yes] [Right: Yes] Calf Left: Right: Point of Measurement: 35 cm From Medial Instep 42 cm 41 cm Ankle Left: Right: Point of Measurement: 10 cm From Medial Instep 38 cm 37 cm Electronic Signature(s) Signed: 02/18/2021 1:01:44 PM By: [name] RN Entered By: [name] on 02/15/2021 10:25:04 [name] ([id number]) -------------------------------------------------------------------------------- Multi Wound Chart Details Patient Name: [name] Date of Service: 02/15/2021 10:15 AM Medical Record Number: [id number] Patient Account Number: [id number] Date of Birth/Sex: 1950-10-22 (70 y.o. 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 Extremity Extremity Comorbid History: Hypertension, Peripheral Venous Hypertension, Peripheral Venous Hypertension, Peripheral Venous Disease, Type II Diabetes, Disease, Type II Diabetes, Disease, Type II Diabetes, Neuropathy, Received Neuropathy, Received Neuropathy, Received Chemotherapy Chemotherapy Chemotherapy Date Acquired: 02/12/2021 02/12/2021 02/12/2021 Weeks of Treatment: 0 0 0 Wound Status: Open Open Open Measurements L x W x D (cm) 4.2x1.7x0.1 7x3.2x0.1 3x3.5x0.1 Area (cm) : 5.608 17.593 8.247 Volume (cm) : 0.561 1.759 0.825 Classification: Grade 2 Grade 2 Grade 2 Exudate Amount: Medium Medium Medium Exudate Type: Serosanguineous Serosanguineous Serosanguineous Exudate Color: red, brown red, brown red, brown Granulation Amount: None Present (0%) None Present (0%) None Present (0%) Necrotic Amount: None Present (0%) None Present (0%) None Present (0%) Exposed Structures: Fascia: No Fascia: No Fascia: No Fat Layer (Subcutaneous Tissue): Fat Layer (Subcutaneous Tissue): Fat Layer (Subcutaneous Tissue): No No No Tendon: No Tendon: No Tendon: No Muscle: No Muscle: No Muscle: No Joint: No Joint: No Joint: No Bone: No Bone: No Bone: No Epithelialization: None None None Procedures Performed: Compression Therapy Compression Therapy Compression  Therapy Treatment Notes Electronic Signature(s) Signed: 02/15/2021 5:14:01 PM By: Linton Ham MD Entered By: Linton Ham on 02/15/2021 10:38:32 [name] ([id number]) -------------------------------------------------------------------------------- Multi-Disciplinary Care Plan Details Patient Name: [name] Date of Service: 02/15/2021 10:15 AM Medical Record Number: [id number] Patient Account Number: [id number] Date of Birth/Sex: Jun 24, 1950 (70 y.o. M) Treating RN: [name] Primary Care Jelitza Manninen: Felipa Eth Other Clinician: Referring Waymon Laser: Felipa Eth Treating Deborah Dondero/Extender: Tito Dine in Treatment: 2 Active Inactive Wound/Skin Impairment Nursing Diagnoses: Knowledge deficit related to ulceration/compromised skin integrity Goals: Patient/caregiver will verbalize understanding of skin care regimen Date Initiated: 01/26/2021 Target Resolution Date: 02/25/2021 Goal Status: Active Interventions: Assess patient/caregiver ability to obtain necessary supplies Assess patient/caregiver ability to perform ulcer/skin care regimen upon admission and as needed Assess ulceration(s) every visit Notes: Electronic Signature(s) Signed: 02/18/2021 1:01:44 PM By: [name] RN Entered By: [name] on 02/15/2021 10:35:57 [name] ([id number]) -------------------------------------------------------------------------------- Pain Assessment Details Patient Name: [name] Date of Service: 02/15/2021 10:15 AM Medical Record Number: [id number] Patient Account Number: [id number] Date of Birth/Sex: April 20, 1951 (70 y.o. M) Treating RN: [name] Primary Care Lindsea Olivar: Felipa Eth Other Clinician: Referring Sabastien Tyler: Felipa Eth Treating Dayzee Trower/Extender: Tito Dine in Treatment: 2 Active Problems Location of Pain Severity and Description of Pain Patient Has Paino No Site Locations Pain Management and  Medication Current Pain Management: Electronic Signature(s) Signed: 02/18/2021 1:01:44 PM By: [name] RN Entered By: [name] on 02/15/2021 10:18:06 [name] ([id number]) --------------------------------------------------------------------------------  Patient/Caregiver Education Details Patient Name: PATTRICK, BADY Date of Service: 02/15/2021 10:15 AM Medical Record Number: [id number] Patient Account Number: [id number] Date of Birth/Gender: 1950-06-03 (70 y.o. M) Treating RN: [name] Primary Care Physician: Felipa Eth Other Clinician: Referring Physician: Felipa Eth Treating Physician/Extender: Tito Dine in Treatment: 2 Education Assessment Education Provided To: Patient Education Topics Provided Wound/Skin Impairment: Methods: Explain/Verbal Responses: State content correctly Electronic Signature(s) Signed: 02/18/2021 1:01:44 PM By: [name] RN Entered By: [name] on 02/15/2021 10:55:40 [name] ([id number]) -------------------------------------------------------------------------------- Wound Assessment Details Patient Name: [name] Date of Service: 02/15/2021 10:15 AM Medical Record Number: [id number] Patient Account Number: [id number] Date of Birth/Sex: 1950/10/10 (70 y.o. M) Treating RN: [name] Primary Care Nickson Middlesworth: Felipa Eth Other Clinician: Referring Darriana Deboy: Felipa Eth Treating Kasey Hansell/Extender: Tito Dine in Treatment: 2 Wound Status Wound Number: 12 Primary Diabetic Wound/Ulcer of the Lower Extremity Etiology: Wound Location: Right, Anterior Lower Leg Wound Open Wounding Event: Gradually Appeared Status: Date Acquired: 02/12/2021 Comorbid Hypertension, Peripheral Venous Disease, Type II Weeks Of Treatment: 0 History: Diabetes, Neuropathy, Received Chemotherapy Clustered Wound: No Photos Wound Measurements Length: (cm) 4.2 Width: (cm) 1.7 Depth: (cm) 0.1 Area:  (cm) 5.608 Volume: (cm) 0.561 % Reduction in Area: % Reduction in Volume: Epithelialization: None Tunneling: No Undermining: No Wound Description Classification: Grade 2 Exudate Amount: Medium Exudate Type: Serosanguineous Exudate Color: red, brown Foul Odor After Cleansing: No Slough/Fibrino No Wound Bed Granulation Amount: None Present (0%) Exposed Structure Necrotic Amount: None Present (0%) Fascia Exposed: No Fat Layer (Subcutaneous Tissue) Exposed: No Tendon Exposed: No Muscle Exposed: No Joint Exposed: No Bone Exposed: No Treatment Notes Wound #12 (Lower Leg) Wound Laterality: Right, Anterior Cleanser Wound Cleanser Discharge Instruction: Wash your hands with soap and water. Remove old dressing, discard into plastic bag and place into trash. Cleanse the wound with Wound Cleanser prior to applying a clean dressing using gauze sponges, not tissues or cotton balls. Do not scrub or use excessive force. Pat dry using gauze sponges, not tissue or cotton balls. WATARU, MCCOWEN ([id number]) Peri-Wound Care Topical Primary Dressing Xeroform 4x4-HBD (in/in) Discharge Instruction: Apply Xeroform 4x4-HBD (in/in) as directed Secondary Dressing ABD Pad 5x9 (in/in) Discharge Instruction: Cover with ABD pad Secured With Compression Wrap Medichoice 4 layer Compression System, 35-40 mmHG Discharge Instruction: Apply multi-layer wrap as directed. Compression Stockings Add-Ons Electronic Signature(s) Signed: 02/18/2021 1:01:44 PM By: [name] RN Entered By: [name] on 02/15/2021 10:21:35 [name] ([id number]) -------------------------------------------------------------------------------- Wound Assessment Details Patient Name: [name] Date of Service: 02/15/2021 10:15 AM Medical Record Number: [id number] Patient Account Number: [id number] Date of Birth/Sex: 1950-06-20 (70 y.o. M) Treating RN: [name] Primary Care Christell Steinmiller: Felipa Eth Other  Clinician: Referring Zayven Powe: Felipa Eth Treating Araina Butrick/Extender: Tito Dine in Treatment: 2 Wound Status Wound Number: 13 Primary Diabetic Wound/Ulcer of the Lower Extremity Etiology: Wound Location: Right, Medial Lower Leg Wound Open Wounding Event: Gradually Appeared Status: Date Acquired: 02/12/2021 Comorbid Hypertension, Peripheral Venous Disease, Type II Weeks Of Treatment: 0 History: Diabetes, Neuropathy, Received Chemotherapy Clustered Wound: No Photos Wound Measurements Length: (cm) 7 Width: (cm) 3.2 Depth: (cm) 0.1 Area: (cm) 17.593 Volume: (cm) 1.759 % Reduction in Area: % Reduction in Volume: Epithelialization: None Tunneling: No Undermining: No Wound Description Classification: Grade 2 Exudate Amount: Medium Exudate Type: Serosanguineous Exudate Color: red, brown Wound Bed Granulation Amount: None Present (0%) Exposed Structure Necrotic Amount: None Present (0%) Fascia Exposed: No Fat Layer (Subcutaneous Tissue) Exposed: No Tendon Exposed: No Muscle Exposed: No  Joint Exposed: No Bone Exposed: No Treatment Notes Wound #13 (Lower Leg) Wound Laterality: Right, Medial Cleanser Wound Cleanser Discharge Instruction: Wash your hands with soap and water. Remove old dressing, discard into plastic bag and place into trash. Cleanse the wound with Wound Cleanser prior to applying a clean dressing using gauze sponges, not tissues or cotton balls. Do not scrub or use excessive force. Pat dry using gauze sponges, not tissue or cotton balls. DERYCK, HIPPLER ([id number]) Peri-Wound Care Topical Primary Dressing Xeroform 4x4-HBD (in/in) Discharge Instruction: Apply Xeroform 4x4-HBD (in/in) as directed Secondary Dressing ABD Pad 5x9 (in/in) Discharge Instruction: Cover with ABD pad Secured With Compression Wrap Medichoice 4 layer Compression System, 35-40 mmHG Discharge Instruction: Apply multi-layer wrap as directed. Compression  Stockings Add-Ons Electronic Signature(s) Signed: 02/18/2021 1:01:44 PM By: [name] RN Entered By: [name] on 02/15/2021 10:23:15 [name] ([id number]) -------------------------------------------------------------------------------- Wound Assessment Details Patient Name: [name] Date of Service: 02/15/2021 10:15 AM Medical Record Number: [id number] Patient Account Number: [id number] Date of Birth/Sex: 1950-10-22 (70 y.o. M) Treating RN: [name] Primary Care Adreanna Fickel: Felipa Eth Other Clinician: Referring Kolter Reaver: Felipa Eth Treating Ariea Rochin/Extender: Tito Dine in Treatment: 2 Wound Status Wound Number: 14 Primary Diabetic Wound/Ulcer of the Lower Extremity Etiology: Wound Location: Right, Posterior Lower Leg Wound Open Wounding Event: Gradually Appeared Status: Date Acquired: 02/12/2021 Comorbid Hypertension, Peripheral Venous Disease, Type II Weeks Of Treatment: 0 History: Diabetes, Neuropathy, Received Chemotherapy Clustered Wound: No Photos Wound Measurements Length: (cm) 3 Width: (cm) 3.5 Depth: (cm) 0.1 Area: (cm) 8.247 Volume: (cm) 0.825 % Reduction in Area: % Reduction in Volume: Epithelialization: None Tunneling: No Undermining: No Wound Description Classification: Grade 2 Exudate Amount: Medium Exudate Type: Serosanguineous Exudate Color: red, brown Foul Odor After Cleansing: No Slough/Fibrino No Wound Bed Granulation Amount: None Present (0%) Exposed Structure Necrotic Amount: None Present (0%) Fascia Exposed: No Fat Layer (Subcutaneous Tissue) Exposed: No Tendon Exposed: No Muscle Exposed: No Joint Exposed: No Bone Exposed: No Treatment Notes Wound #14 (Lower Leg) Wound Laterality: Right, Posterior Cleanser Wound Cleanser Discharge Instruction: Wash your hands with soap and water. Remove old dressing, discard into plastic bag and place into trash. Cleanse the wound with Wound Cleanser prior to  applying a clean dressing using gauze sponges, not tissues or cotton balls. Do not scrub or use excessive force. Pat dry using gauze sponges, not tissue or cotton balls. KAILO, KOSIK ([id number]) Peri-Wound Care Topical Primary Dressing Xeroform 4x4-HBD (in/in) Discharge Instruction: Apply Xeroform 4x4-HBD (in/in) as directed Secondary Dressing ABD Pad 5x9 (in/in) Discharge Instruction: Cover with ABD pad Secured With Compression Wrap Medichoice 4 layer Compression System, 35-40 mmHG Discharge Instruction: Apply multi-layer wrap as directed. Compression Stockings Add-Ons Electronic Signature(s) Signed: 02/18/2021 1:01:44 PM By: [name] RN Entered By: [name] on 02/15/2021 10:24:24 [name] ([id number]) -------------------------------------------------------------------------------- Vitals Details Patient Name: [name] Date of Service: 02/15/2021 10:15 AM Medical Record Number: [id number] Patient Account Number: [id number] Date of Birth/Sex: 1950/09/03 (70 y.o. M) Treating RN: [name] Primary Care Faris Coolman: Felipa Eth Other Clinician: Referring Kimani Bedoya: Felipa Eth Treating Dawt Reeb/Extender: Tito Dine in Treatment: 2 Vital Signs Time Taken: 10:17 Temperature (F): 98.2 Height (in): 73 Pulse (bpm): 68 Weight (lbs): 312 Respiratory Rate (breaths/min): 18 Body Mass Index (BMI): 41.2 Blood Pressure (mmHg): 123/59 Reference Range: 80 - 120 mg / dl Electronic Signature(s) Signed: 02/18/2021 1:01:44 PM By: [name] RN Entered By: [name] on 02/15/2021 10:17:59

## 2021-02-22 ENCOUNTER — Encounter: Payer: PPO | Admitting: Internal Medicine

## 2021-02-22 ENCOUNTER — Other Ambulatory Visit: Payer: Self-pay

## 2021-02-22 DIAGNOSIS — E11622 Type 2 diabetes mellitus with other skin ulcer: Secondary | ICD-10-CM | POA: Diagnosis not present

## 2021-02-22 DIAGNOSIS — L97811 Non-pressure chronic ulcer of other part of right lower leg limited to breakdown of skin: Secondary | ICD-10-CM | POA: Diagnosis not present

## 2021-02-22 DIAGNOSIS — L97212 Non-pressure chronic ulcer of right calf with fat layer exposed: Secondary | ICD-10-CM | POA: Diagnosis not present

## 2021-02-22 NOTE — Progress Notes (Addendum)
KODI, STEIL ([id number]) Visit Report for 02/22/2021 Arrival Information Details Patient Name: [name] Date of Service: 02/22/2021 10:15 AM Medical Record Number: [id number] Patient Account Number: [id number] Date of Birth/Sex: 23-Feb-1951 (70 y.o. M) Treating RN: [name] Primary Care [name]: Felipa Eth Other Clinician: Referring Lillieann Pavlich: Felipa Eth Treating Sable Knoles/Extender: Skipper Cliche in Treatment: 3 Visit Information History Since Last Visit All ordered tests and consults were completed: No Patient Arrived: [name] Added or deleted any medications: No Arrival Time: 10:14 Any new allergies or adverse reactions: No Accompanied By: self Had a fall or experienced change in No Transfer Assistance: None activities of daily living that may affect Patient Identification Verified: Yes risk of falls: Secondary Verification Process Completed: Yes Signs or symptoms of abuse/neglect since last visito No Patient Requires Transmission-Based Precautions: No Hospitalized since last visit: No Patient Has Alerts: No Implantable device outside of the clinic excluding No cellular tissue based products placed in the center since last visit: Has Dressing in Place as Prescribed: Yes Has Compression in Place as Prescribed: Yes Pain Present Now: No Electronic Signature(s) Signed: 02/26/2021 4:19:44 PM By: [name] RN Entered By: [name] on 02/22/2021 10:21:13 [name] ([id number]) -------------------------------------------------------------------------------- Clinic Level of Care Assessment Details Patient Name: [name] Date of Service: 02/22/2021 10:15 AM Medical Record Number: [id number] Patient Account Number: [id number] Date of Birth/Sex: Nov 13, 1950 (70 y.o. M) Treating RN: [name] Primary Care Saydie Gerdts: Felipa Eth Other Clinician: Referring Taedyn Glasscock: Felipa Eth Treating Saylee Sherrill/Extender: Skipper Cliche in Treatment:  3 Clinic Level of Care Assessment Items TOOL 1 Quantity Score []  - Use when EandM and Procedure is performed on INITIAL visit 0 ASSESSMENTS - Nursing Assessment / Reassessment []  - General Physical Exam (combine w/ comprehensive assessment (listed just below) when performed on new 0 pt. evals) []  - 0 Comprehensive Assessment (HX, ROS, Risk Assessments, Wounds Hx, etc.) ASSESSMENTS - Wound and Skin Assessment / Reassessment []  - Dermatologic / Skin Assessment (not related to wound area) 0 ASSESSMENTS - Ostomy and/or Continence Assessment and Care []  - Incontinence Assessment and Management 0 []  - 0 Ostomy Care Assessment and Management (repouching, etc.) PROCESS - Coordination of Care []  - Simple Patient / Family Education for ongoing care 0 []  - 0 Complex (extensive) Patient / Family Education for ongoing care []  - 0 Staff obtains Programmer, systems, Records, Test Results / Process Orders []  - 0 Staff telephones HHA, Nursing Homes / Clarify orders / etc []  - 0 Routine Transfer to another Facility (non-emergent condition) []  - 0 Routine Hospital Admission (non-emergent condition) []  - 0 New Admissions / Biomedical engineer / Ordering NPWT, Apligraf, etc. []  - 0 Emergency Hospital Admission (emergent condition) PROCESS - Special Needs []  - Pediatric / Minor Patient Management 0 []  - 0 Isolation Patient Management []  - 0 Hearing / Language / Visual special needs []  - 0 Assessment of Community assistance (transportation, D/C planning, etc.) []  - 0 Additional assistance / Altered mentation []  - 0 Support Surface(s) Assessment (bed, cushion, seat, etc.) INTERVENTIONS - Miscellaneous []  - External ear exam 0 []  - 0 Patient Transfer (multiple staff / Civil Service fast streamer / Similar devices) []  - 0 Simple Staple / Suture removal (25 or less) []  - 0 Complex Staple / Suture removal (26 or more) []  - 0 Hypo/Hyperglycemic Management (do not check if billed separately) []  - 0 Ankle /  Brachial Index (ABI) - do not check if billed separately Has the patient been seen at the hospital within the last three years: Yes Total  Score: 0 Level Of Care: ____ [name] ([id number]) Electronic Signature(s) Signed: 02/26/2021 4:19:44 PM By: [name] RN Entered By: [name] on 02/22/2021 11:00:51 [name] ([id number]) -------------------------------------------------------------------------------- Compression Therapy Details Patient Name: [name] Date of Service: 02/22/2021 10:15 AM Medical Record Number: [id number] Patient Account Number: [id number] Date of Birth/Sex: 1950/10/03 (70 y.o. M) Treating RN: [name] Primary Care Anuoluwapo Mefferd: Felipa Eth Other Clinician: Referring Trysten Berti: Felipa Eth Treating Viviann Broyles/Extender: Skipper Cliche in Treatment: 3 Compression Therapy Performed for Wound Assessment: Wound #12 Right,Anterior Lower Leg Performed By: Clinician [name], RN Compression Type: Four Layer Post Procedure Diagnosis Same as Pre-procedure Electronic Signature(s) Signed: 02/22/2021 10:59:25 AM By: [name] RN Entered By: [name] on 02/22/2021 10:59:24 [name] ([id number]) -------------------------------------------------------------------------------- Compression Therapy Details Patient Name: [name] Date of Service: 02/22/2021 10:15 AM Medical Record Number: [id number] Patient Account Number: [id number] Date of Birth/Sex: 12-Mar-1951 (70 y.o. M) Treating RN: [name] Primary Care Tarez Bowns: Felipa Eth Other Clinician: Referring Schae Cando: Felipa Eth Treating Reisa Coppola/Extender: Skipper Cliche in Treatment: 3 Compression Therapy Performed for Wound Assessment: Wound #13 Right,Medial Lower Leg Performed By: Clinician [name], RN Compression Type: Four Layer Post Procedure Diagnosis Same as Pre-procedure Electronic Signature(s) Signed: 02/22/2021 10:59:25 AM By: [name]  RN Entered By: [name] on 02/22/2021 10:59:25 [name] ([id number]) -------------------------------------------------------------------------------- Compression Therapy Details Patient Name: [name] Date of Service: 02/22/2021 10:15 AM Medical Record Number: [id number] Patient Account Number: [id number] Date of Birth/Sex: [birth year] (70 y.o. M) Treating RN: [name] Primary Care Keaghan Bowens: Felipa Eth Other Clinician: Referring Colen Eltzroth: Felipa Eth Treating Kaylanni Ezelle/Extender: Skipper Cliche in Treatment: 3 Compression Therapy Performed for Wound Assessment: Wound #14 Right,Posterior Lower Leg Performed By: Clinician [name], RN Compression Type: Four Layer Post Procedure Diagnosis Same as Pre-procedure Electronic Signature(s) Signed: 02/22/2021 10:59:26 AM By: [name] RN Entered By: [name] on 02/22/2021 10:59:25 [name] ([id number]) -------------------------------------------------------------------------------- Compression Therapy Details Patient Name: [name] Date of Service: 02/22/2021 10:15 AM Medical Record Number: [id number] Patient Account Number: [id number] Date of Birth/Sex: [birth year] (70 y.o. M) Treating RN: [name] Primary Care Londin Antone: Felipa Eth Other Clinician: Referring Jerame Hedding: Felipa Eth Treating Rhonna Holster/Extender: Skipper Cliche in Treatment: 3 Compression Therapy Performed for Wound Assessment: NonWound Condition Lymphedema - Left Leg Performed By: Clinician [name], RN Compression Type: Four Layer Post Procedure Diagnosis Same as Pre-procedure Electronic Signature(s) Signed: 02/22/2021 11:00:01 AM By: [name] RN Entered By: [name] on 02/22/2021 11:00:00 [name] ([id number]) -------------------------------------------------------------------------------- Encounter Discharge Information Details Patient Name: [name] Date of Service: 02/22/2021 10:15  AM Medical Record Number: [id number] Patient Account Number: [id number] Date of Birth/Sex: 01-04-1951 (70 y.o. M) Treating RN: [name] Primary Care Tarris Delbene: Felipa Eth Other Clinician: Referring Helaine Yackel: Felipa Eth Treating Tamaiya Bump/Extender: Skipper Cliche in Treatment: 3 Encounter Discharge Information Items Discharge Condition: Stable Ambulatory Status: Cane Discharge Destination: Home Transportation: Private Auto Accompanied By: self Schedule Follow-up Appointment: Yes Clinical Summary of Care: Patient Declined Electronic Signature(s) Signed: 02/26/2021 4:19:44 PM By: [name] RN Entered By: [name] on 02/22/2021 11:06:45 [name] ([id number]) -------------------------------------------------------------------------------- Lower Extremity Assessment Details Patient Name: [name] Date of Service: 02/22/2021 10:15 AM Medical Record Number: [id number] Patient Account Number: [id number] Date of Birth/Sex: May 19, 1950 (70 y.o. M) Treating RN: [name] Primary Care Dariusz Brase: Felipa Eth Other Clinician: Referring Yeng Frankie: Felipa Eth Treating Heyden Jaber/Extender: Jeri Cos Weeks in Treatment: 3 Edema Assessment Assessed: [Left: No] [Right: No] Edema: [Left: Yes] [Right: Yes] Calf Left: Right: Point of  Measurement: 35 cm From Medial Instep 42 cm 41 cm Ankle Left: Right: Point of Measurement: 10 cm From Medial Instep 37 cm 37 cm Electronic Signature(s) Signed: 02/26/2021 4:19:44 PM By: [name] RN Entered By: [name] on 02/22/2021 10:41:09 [name] ([id number]) -------------------------------------------------------------------------------- Multi Wound Chart Details Patient Name: [name] Date of Service: 02/22/2021 10:15 AM Medical Record Number: [id number] Patient Account Number: [id number] Date of Birth/Sex: [birth year] (70 y.o. 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 Extremity Extremity Comorbid History: Hypertension, Peripheral Venous Hypertension, Peripheral Venous Hypertension, Peripheral Venous Disease, Type II Diabetes, Disease, Type II Diabetes, Disease, Type II Diabetes, Neuropathy, Received Neuropathy, Received Neuropathy, Received Chemotherapy Chemotherapy Chemotherapy Date Acquired: 02/12/2021 02/12/2021 02/12/2021 Weeks of Treatment: 1 1 1  Wound Status: Open Open Open Measurements L x W x D (cm) 5x2x0.1 4x2.7x0.1 0.5x3x0.1 Area (cm) : 7.854 8.482 1.178 Volume (cm) : 0.785 0.848 0.118 % Reduction in Area: -40.00% 51.80% 85.70% % Reduction in Volume: -39.90% 51.80% 85.70% Classification: Grade 2 Grade 2 Grade 2 Exudate Amount: Medium Medium Medium Exudate Type: Serosanguineous Serosanguineous Serosanguineous Exudate Color: red, brown red, brown red, brown Granulation Amount: None Present (0%) Large (67-100%) Large (67-100%) Necrotic Amount: None Present (0%) None Present (0%) None Present (0%) Exposed Structures: Fascia: No Fat Layer (Subcutaneous Tissue): Fat Layer (Subcutaneous Tissue): Fat Layer (Subcutaneous Tissue): Yes Yes No Fascia: No Fascia: No Tendon: No Tendon: No Tendon: No Muscle: No Muscle: No Muscle: No Joint: No Joint: No Joint: No Bone: No Bone: No Bone: No Epithelialization: None None None Treatment Notes Electronic  Signature(s) Signed: 02/22/2021 10:58:50 AM By: [name] RN Entered By: [name] on 02/22/2021 10:58:49 [name] ([id number]) -------------------------------------------------------------------------------- Multi-Disciplinary Care Plan Details Patient Name: [name] Date of Service: 02/22/2021 10:15 AM Medical Record Number: [id number] Patient Account Number: [id number] Date of Birth/Sex: 1950-09-29 (70 y.o. M) Treating RN: [name] Primary Care Jaion Lagrange: Felipa Eth Other Clinician: Referring Bubber Rothert: Felipa Eth Treating Alyanah Elliott/Extender: Skipper Cliche in Treatment: 3 Active Inactive Wound/Skin Impairment Nursing Diagnoses: Knowledge deficit related to ulceration/compromised skin integrity Goals: Patient/caregiver will verbalize understanding of skin care regimen Date Initiated: 01/26/2021 Target Resolution Date: 02/25/2021 Goal Status: Active Interventions: Assess patient/caregiver ability to obtain necessary supplies Assess patient/caregiver ability to perform ulcer/skin care regimen upon admission and as needed Assess ulceration(s) every visit Notes: Electronic Signature(s) Signed: 02/22/2021 10:58:15 AM By: [name] RN Entered By: [name] on 02/22/2021 10:58:14 [name] ([id number]) -------------------------------------------------------------------------------- Pain Assessment Details Patient Name: [name] Date of Service: 02/22/2021 10:15 AM Medical Record Number: [id number] Patient Account Number: [id number] Date of Birth/Sex: 1951/03/07 (70 y.o. M) Treating RN: [name] Primary Care Charlesetta Milliron: Felipa Eth Other Clinician: Referring Nirali Magouirk: Felipa Eth Treating Laelani Vasko/Extender: Skipper Cliche in Treatment: 3 Active Problems Location of Pain Severity and Description of Pain Patient Has Paino No Site Locations Pain Management and Medication Current Pain Management: Electronic  Signature(s) Signed: 02/26/2021 4:19:44 PM By: [name] RN Entered By: [name] on 02/22/2021 10:22:14 [name] ([id number]) -------------------------------------------------------------------------------- Patient/Caregiver Education Details Patient Name: [name], [name]  Date of Service: 02/22/2021 10:15 AM Medical Record Number: [id number] Patient Account Number: [id number] Date of Birth/Gender: April 04, 1951 (70 y.o. M) Treating RN: [name] Primary Care Physician: Felipa Eth Other Clinician: Referring Physician: Felipa Eth Treating Physician/Extender: Skipper Cliche in Treatment: 3 Education Assessment Education Provided To: Patient Education Topics Provided Wound/Skin Impairment: Methods: Explain/Verbal Responses: State content correctly Electronic Signature(s) Signed: 02/26/2021 4:19:44 PM By: [name] RN Entered By: [name] on 02/22/2021 11:01:11 [name] ([id number]) -------------------------------------------------------------------------------- Wound Assessment Details Patient Name: [name] Date of Service: 02/22/2021 10:15 AM Medical Record Number: [id number] Patient Account Number: [id number] Date of Birth/Sex: Aug 10, 1950 (70 y.o. M) Treating RN: [name] Primary Care Milka Windholz: Felipa Eth Other Clinician: Referring Erianna Jolly: Felipa Eth Treating Kosei Rhodes/Extender: Jeri Cos Weeks in Treatment: 3 Wound Status Wound Number: 12 Primary Diabetic Wound/Ulcer of the Lower Extremity Etiology: Wound Location: Right, Anterior Lower Leg Wound Open Wounding Event: Gradually Appeared Status: Date Acquired: 02/12/2021 Comorbid Hypertension, Peripheral Venous Disease, Type II Weeks Of Treatment: 1 History: Diabetes, Neuropathy, Received Chemotherapy Clustered Wound: No Photos Wound Measurements Length: (cm) 5 Width: (cm) 2 Depth: (cm) 0.1 Area: (cm) 7.854 Volume: (cm) 0.785 % Reduction in Area: -40% %  Reduction in Volume: -39.9% Epithelialization: None Tunneling: No Undermining: No Wound Description Classification: Grade 2 Exudate Amount: Medium Exudate Type: Serosanguineous Exudate Color: red, brown Foul Odor After Cleansing: No Slough/Fibrino No Wound Bed Granulation Amount: None Present (0%) Exposed Structure Necrotic Amount: None Present (0%) Fascia Exposed: No Fat Layer (Subcutaneous Tissue) Exposed: No Tendon Exposed: No Muscle Exposed: No Joint Exposed: No Bone Exposed: No Treatment Notes Wound #12 (Lower Leg) Wound Laterality: Right, Anterior Cleanser Wound Cleanser Discharge Instruction: Wash your hands with soap and water. Remove old dressing, discard into plastic bag and place into trash. Cleanse the wound with Wound Cleanser prior to applying a clean dressing using gauze sponges, not tissues or cotton balls. Do not scrub or use excessive force. Pat dry using gauze sponges, not tissue or cotton balls. [name], [name] ([id number]) Peri-Wound Care Topical Primary Dressing Xeroform 4x4-HBD (in/in) Discharge Instruction: Apply Xeroform 4x4-HBD (in/in) as directed Secondary Dressing ABD Pad 5x9 (in/in) Discharge Instruction: Cover with ABD pad Secured With Compression Wrap Medichoice 4 layer Compression System, 35-40 mmHG Discharge Instruction: Apply multi-layer wrap as directed. Compression Stockings Add-Ons Electronic Signature(s) Signed: 02/26/2021 4:19:44 PM By: [name] RN Entered By: [name] on 02/22/2021 10:38:55 [name] ([id number]) -------------------------------------------------------------------------------- Wound Assessment Details Patient Name: [name] Date of Service: 02/22/2021 10:15 AM Medical Record Number: [id number] Patient Account Number: [id number] Date of Birth/Sex: April 17, 1951 (70 y.o. M) Treating RN: [name] Primary Care Kallan Merrick: Felipa Eth Other Clinician: Referring Lene Mckay: Felipa Eth Treating  Gavyn Zoss/Extender: Jeri Cos Weeks in Treatment: 3 Wound Status Wound Number: 13 Primary Diabetic Wound/Ulcer of the Lower Extremity Etiology: Wound Location: Right, Medial Lower Leg Wound Open Wounding Event: Gradually Appeared Status: Date Acquired: 02/12/2021 Comorbid Hypertension, Peripheral Venous Disease, Type II Weeks Of Treatment: 1 History: Diabetes, Neuropathy, Received Chemotherapy Clustered Wound: No Photos Wound Measurements Length: (cm) 4 Width: (cm) 2.7 Depth: (cm) 0.1 Area: (cm) 8.482 Volume: (cm) 0.848 % Reduction in Area: 51.8% % Reduction in Volume: 51.8% Epithelialization: None Tunneling: No Undermining: No Wound Description Classification: Grade 2 Exudate Amount: Medium Exudate Type: Serosanguineous Exudate Color: red, brown Foul Odor After Cleansing: No Slough/Fibrino No Wound Bed Granulation Amount: Large (67-100%) Exposed Structure Necrotic Amount: None Present (0%) Fascia Exposed: No Fat Layer (Subcutaneous Tissue) Exposed: Yes Tendon Exposed: No Muscle Exposed: No  Joint Exposed: No Bone Exposed: No Treatment Notes Wound #13 (Lower Leg) Wound Laterality: Right, Medial Cleanser Wound Cleanser Discharge Instruction: Wash your hands with soap and water. Remove old dressing, discard into plastic bag and place into trash. Cleanse the wound with Wound Cleanser prior to applying a clean dressing using gauze sponges, not tissues or cotton balls. Do not scrub or use excessive force. Pat dry using gauze sponges, not tissue or cotton balls. [name], [name] ([id number]) Peri-Wound Care Topical Primary Dressing Xeroform 4x4-HBD (in/in) Discharge Instruction: Apply Xeroform 4x4-HBD (in/in) as directed Secondary Dressing ABD Pad 5x9 (in/in) Discharge Instruction: Cover with ABD pad Secured With Compression Wrap Medichoice 4 layer Compression System, 35-40 mmHG Discharge Instruction: Apply multi-layer wrap as directed. Compression  Stockings Add-Ons Electronic Signature(s) Signed: 02/26/2021 4:19:44 PM By: [name] RN Entered By: [name] on 02/22/2021 10:39:43 [name] ([id number]) -------------------------------------------------------------------------------- Wound Assessment Details Patient Name: [name] Date of Service: 02/22/2021 10:15 AM Medical Record Number: [id number] Patient Account Number: [id number] Date of Birth/Sex: 11/08/1950 (70 y.o. M) Treating RN: [name] Primary Care Halley Shepheard: Felipa Eth Other Clinician: Referring Ammanda Dobbins: Felipa Eth Treating Arber Wiemers/Extender: Jeri Cos Weeks in Treatment: 3 Wound Status Wound Number: 14 Primary Diabetic Wound/Ulcer of the Lower Extremity Etiology: Wound Location: Right, Posterior Lower Leg Wound Open Wounding Event: Gradually Appeared Status: Date Acquired: 02/12/2021 Comorbid Hypertension, Peripheral Venous Disease, Type II Weeks Of Treatment: 1 History: Diabetes, Neuropathy, Received Chemotherapy Clustered Wound: No Photos Wound Measurements Length: (cm) 0.5 Width: (cm) 3 Depth: (cm) 0.1 Area: (cm) 1.178 Volume: (cm) 0.118 % Reduction in Area: 85.7% % Reduction in Volume: 85.7% Epithelialization: None Tunneling: No Undermining: No Wound Description Classification: Grade 2 Exudate Amount: Medium Exudate Type: Serosanguineous Exudate Color: red, brown Foul Odor After Cleansing: No Slough/Fibrino No Wound Bed Granulation Amount: Large (67-100%) Exposed Structure Necrotic Amount: None Present (0%) Fascia Exposed: No Fat Layer (Subcutaneous Tissue) Exposed: Yes Tendon Exposed: No Muscle Exposed: No Joint Exposed: No Bone Exposed: No Treatment Notes Wound #14 (Lower Leg) Wound Laterality: Right, Posterior Cleanser Wound Cleanser Discharge Instruction: Wash your hands with soap and water. Remove old dressing, discard into plastic bag and place into trash. Cleanse the wound with Wound Cleanser  prior to applying a clean dressing using gauze sponges, not tissues or cotton balls. Do not scrub or use excessive force. Pat dry using gauze sponges, not tissue or cotton balls. [name], [name] ([id number]) Peri-Wound Care Topical Primary Dressing Xeroform 4x4-HBD (in/in) Discharge Instruction: Apply Xeroform 4x4-HBD (in/in) as directed Secondary Dressing ABD Pad 5x9 (in/in) Discharge Instruction: Cover with ABD pad Secured With Compression Wrap Medichoice 4 layer Compression System, 35-40 mmHG Discharge Instruction: Apply multi-layer wrap as directed. Compression Stockings Add-Ons Electronic Signature(s) Signed: 02/26/2021 4:19:44 PM By: [name] RN Entered By: [name] on 02/22/2021 10:40:28 [name] ([id number]) -------------------------------------------------------------------------------- Vitals Details Patient Name: [name] Date of Service: 02/22/2021 10:15 AM Medical Record Number: [id number] Patient Account Number: [id number] Date of Birth/Sex: 19-Jan-1951 (70 y.o. M) Treating RN: [name] Primary Care Draco Malczewski: Felipa Eth Other Clinician: Referring Elfie Costanza: Felipa Eth Treating Javyon Fontan/Extender: Skipper Cliche in Treatment: 3 Vital Signs Time Taken: 10:21 Temperature (F): 98.1 Height (in): 73 Pulse (bpm): 77 Weight (lbs): 312 Respiratory Rate (breaths/min): 18 Body Mass Index (BMI): 41.2 Blood Pressure (mmHg): 124/75 Reference Range: 80 - 120 mg / dl Electronic Signature(s) Signed: 02/26/2021 4:19:44 PM By: [name] RN Entered By: [name] on 02/22/2021 10:21:52

## 2021-02-22 NOTE — Progress Notes (Addendum)
[name], [name] ([id number]) Visit Report for 02/22/2021 Chief Complaint Document Details Patient Name: [name] Date of Service: 02/22/2021 10:15 AM Medical Record Number: [id number] Patient Account Number: [id number] Date of Birth/Sex: May 17, 1950 (70 y.o. M) Treating RN: [name] Primary Care Provider: Felipa Eth Other Clinician: Referring Provider: Felipa Eth Treating Provider/Extender: Skipper Cliche in Treatment: 3 Information Obtained from: Patient Chief Complaint Left LE ulcers Electronic Signature(s) Signed: 02/22/2021 10:13:22 AM By: Worthy Keeler PA-C Entered By: Worthy Keeler on 02/22/2021 10:13:22 [name] ([id number]) -------------------------------------------------------------------------------- HPI Details Patient Name: [name] Date of Service: 02/22/2021 10:15 AM Medical Record Number: [id number] Patient Account Number: [id number] Date of Birth/Sex: 10-18-1950 (70 y.o. M) Treating RN: [name] Primary Care Provider: Felipa Eth Other Clinician: Referring Provider: Felipa Eth Treating Provider/Extender: Skipper Cliche in Treatment: 3 History of Present Illness HPI Description: 70 year old male who presented to the ER with bilateral lower extremity blisters which had started last week. he has a past medical history of leukemia, diabetes mellitus, hypertension, edema of both lower extremities, his recurrent skin infections, peripheral vascular disease, coronary artery disease, congestive heart failure and peripheral neuropathy. in the ER he was given Rocephin and put on Silvadene cream. he was put on oral doxycycline and was asked to follow-up with the [facility]. His last hemoglobin A1c was 6.6 in December and he checks his blood sugar once a week. He does not have any physicians outside the [place] system. He does not recall any vascular duplex studies done either for arterial or venous disease but was told to wear  compression stockings which he does not use 05/30/2016 -- we have not yet received any of his notes from the [facility] hospital system and his arterial and venous duplex studies are scheduled here in [place] around mid February. We are unable to have his insurance accepted by home health agencies and hence he is getting dressings only once a week. 06/06/16 -- -- I received a call from the patient's PCP at the [facility] and spoke to Dr. Garvin Fila, phone number [phone] and fax number [phone]. She confirmed that no vascular testing was done over the last 5 years and she would be happy to do them if the patient did want them to be done at the [place] and we could fax him a request. Readmission: 70 year old male seen by as in February of this year and was referred to vein and vascular for studies and opinion from the vascular surgeons. The patient returns today with a fresh problem having had blisters on his left lower extremity which have been there for about 5 days and he clearly states that he has been wearing his compression stockings as advised though he could not read the moderate compression and has been wearing light compression. Review of his electronic medical records note that he had lower extremity arterial duplex examination done on 06/23/2016 which showed no hemodynamically significant stenosis in the bilateral lower extremity arterial system. He also had a lower extremity venous reflux examination done on 07/07/2016 and it was noted that he had venous incompetence in the right great saphenous vein and bilateral common femoral veins. Patient was seen by Dr. Tamala Julian on the same day and for some reason his notes do not reflect the venous studies or the arterial studies and he recommended patient do a venous duplex ultrasound to look for reflux and return to see him.he would also consider a lymph pump if required. The patient was told that his workup  was normal and hence the patient  canceled his follow-up appointment. 02/03/17 on evaluation today patient left medial lower extremity blister appears to be doing about the same. It is still continuing to drain and there's still the blistered skin covering the wound bed which is making it difficult for the alternate to do its job. Fortunately there is no evidence of cellulitis. No fevers chills noted. Patient states in general he is not having any significant discomfort. Patient's lower extremity arterial duplex exam revealed that patient was hemodynamically stable with no evidence of stenosis in regard to the bilateral lower extremities. The lower extremity venous reflux exam revealed the patient had venous incontinence noted in the right greater saphenous and bilateral common femoral vein. There is no evidence of deep or superficial vein thrombosis in the bilateral lower extremities. Readmission: 11/12/18 Patient presents for evaluation our clinic today concerning issues that he is having with his left lower extremity. He tells me that a couple weeks ago he began developing blisters on the left lower extremity along with increased swelling. He typically wears his compression stockings on a regular basis is previously been evaluated both here as well is with vascular surgery they would recommend lymphedema pumps but unfortunately that somehow fell through and he never heard anything back from that. Nonetheless I think lymphedema pumps would be beneficial for this patient. He does have a history of hypertension and diabetes. Obviously the chronic venous stasis and lymphedema as well. At this point the blisters have been given in more trouble he states sometimes when the blisters openings able to clean it down with alcohol and it will dry out and do well. Unfortunately that has not been the case this time. He is having some discomfort although this mean these with cleaning the areas he doesn't have discomfort just on a regular basis. He  has not been able to wear his compression stockings since the blisters arose due to the fact that of course it will drain into the socks causing additional issues and he didn't have any way to wrap this otherwise. He has increased to taking his Lasix every day instead of every other day. He sees his primary care provider later this month as well. No fevers, chills, nausea, or vomiting noted at this time. 11/19/18-Patient returns at 1 week, per intake RN the amount of seepage into the compression wraps was definitely improved, overall all the wounds are measuring smaller but continuing silver alginate to the wounds as primary dressing 11/26/18 on evaluation today patient appears to be doing quite well in regard to his left lower Trinity ulcers. In fact of the areas that were noted initially he only has two regions still open. There is no evidence of active infection at this time. He still is not heard anything from the company regarding lymphedema pumps as of yet. Again as previously seen vascular they have not recommended any surgical intervention. 12/03/2018 on evaluation today patient actually appears to be doing quite well with regard to his lower extremity ulcers. In fact most of the areas appear to be healed the one spot which does not seem to be completely healed I am unsure of whether or not this is really draining that much but nonetheless there does not appear to be any signs of infection or significant drainage at this point. There is no sign of fever, chills, nausea, vomiting, or diarrhea. Overall I am pleased with how things have progressed I think is very close to being able to transition  to his home compression stockings. FIRAS, GUARDADO ([id number]) 12/10/2018 upon evaluation today patient appears to be doing quite well with regard to his left lower extremity. He has been tolerating the dressing changes without complication. Fortunately there is no signs of active infection at this time. He  appears after thorough evaluation of his leg to only have 1 small area that remains open at this point everything else appears to be almost completely closed. He still have significant swelling of the left lower extremity. We had discussed discussing this with his primary care provider he is not able to see her in person they were at the [facility] and right now the [place] is not seeing patients on site. According to the patient anyway. Subsequently he did speak with her apparently and his primary care provider feels that he may likely have a DVT. With that being said she has not seen his leg she is just going off of his history. Nonetheless that is a concern that the patient now has as well and while I do not feel the DVT is likely we can definitely ensure that that is not the case I will go ahead and see about putting that order in today. Nonetheless otherwise I am in a recommend that we continue with the current wound care measures including the compression therapy most likely. We just need to ensure that his leg is indeed free of any DVTs. 12/17/2018 on evaluation today patient actually appears to be completely healed today. He does have 2 very small areas of blistering although this is not anything too significant at this point which is good news. With that being said I am in agreement with the fact that I think he is completely healed at this point. He does want to get back into his compression stocking. The good news is we have gotten approval from insurance for his lymphedema pumps we received a letter since last saw him last week. The other good news is his study did come back and showed no evidence of a DVT. 12/20/2018 on evaluation today patient presents for follow-up concerning his ongoing issues with his left lower extremity. He was actually discharged last Friday and did fairly well until he states blisters opened this morning. He tells me he has been wearing his compression stocking although  he has a hard time getting this on. There does not appear to be any signs of active infection at this time. No fevers, chills, nausea, vomiting, or diarrhea. 12/27/2018 on evaluation today patient appears to be doing very well with regard to his swelling of the left lower extremity the 4 layer compression wrap seems to have been beneficial for him. Fortunately there is no signs of active infection at this time. Patient has been tolerating the compression wrap without complication and his foot swelling in particular appears to be greatly improved. He does still have a wound on the lateral portion of his left leg I believe this is more of a blister that has now reopened. 01/03/2019 on evaluation today patient actually appears to be doing excellent in regard to his left lower extremity. He did receive his compression pumps and is actually use this 7 times since he was last here in the office. On top of the compression wrap he is now roughly 3 cm better at the calf and 2 cm better at the ankle he also states that his foot seem to go an issue better without even having to use a shoe horn. Obviously I  think this is all evidence that he is doing excellent in this regard. The other good news is he does not appear to have anything open today as far as wounds are concerned. 01/15/2019 on evaluation today patient appears to be doing more poorly yet again with regard to his left lower extremity. He has developed new wounds again after being discharged just recently. Unfortunately this continues to be the case that he will heal and then have subsequent new wounds. The last time I was hopeful that he may not end up coming back too quickly especially since he states he has been using his lymphedema pumps along with wearing his compression. Nonetheless he had a blister on the back of his leg that popped up on the left and this has opened up into an ulceration it is quite painful. 01/22/19 on evaluation today patient  actually appears to be doing well with regard to his wound on the left lower extremity. He's been tolerating the dressing changes without complication including the compression wrap in the wound appears to be significantly smaller today which is great news. Overall very pleased in this regard. 01/29/2019 on evaluation today patient appears to be doing well with regard to his left posterior lower extremity ulcer. He has been tolerating the dressing changes without complication. This is not completely healed but is getting much closer. We did order a Farrow wrap 4000 for him he has received this and has it with him today although I am not sure we are quite ready to start him on that as of yet. We are very close. 02/05/2019 on evaluation today patient actually appears to be doing quite well with regard to his left posterior lower extremity ulcer. He still has a very tiny opening remaining but the fortunate thing is he seems to be healing quite nicely. He also did get his Farrow wrap which I am hoping will help with his edema control as well at home. Fortunately there is no evidence of active infection. 02/12/2019 patient and fortunately appears to be doing poorly in regard to his wounds of the left lower extremity. He was very close to healing therefore we attempted to use his Velcro compression wraps continuing with lymphedema pumps at home. Unfortunately that does not seem to have done very well for him. He tells me that he wore them all the time but again I am not sure why if that is the case that he is having such significant edema. He is still on his fluid pills as well. With that being said there is no obvious sign of infection although I do wonder about the possibility of infection at this time as well. 02/19/2019 unfortunately upon evaluation today patient appears to be doing more poorly with regard to his left lower extremity. He is not showing signs of significant improvement and I think the  biggest issue here is that he does have an infection that appears to likely be Pseudomonas. That is based on the blue-green drainage that were noted at this time. Unfortunately the antibiotic that has been on is not going to take care of this at all. I think they will get a need to switch him to either Levaquin or Cipro and this was discussed with the patient. 02/26/2019 on evaluation today patient's lower extremity on the left appears to be doing significantly better as compared to last evaluation. Fortunately there is no signs of active infection at this time. He has been tolerating the compression wrap without complication in  fact he made it the whole week at this point. He is showing signs of excellent improvement I am very happy in this regard. With that being said he is having some issues with infection we did review the results of his culture which I noted today. He did have a positive finding for Enterobacter as well as Alcaligenes faecalis. Fortunately the Levaquin that I placed him on will work for both which is great news. There is no signs of systemic infection at this point. 10/30; left posterior leg wound in the setting of very significant edema and what looks like chronic venous inflammation. He has compression pumps but does not use them. We have been using 3 layer compression. Silver alginate to the wound as the primary dressing 03/18/2019 on evaluation today patient appears to be doing a little better compared to last time I saw him. He really has not been using his compression pumps he tells me that he is having too much discomfort. He has been keeping his wraps on however. He is only been taking his fluid pills every other day because he states they are not really helping and he has an appointment with his primary care provider at the [facility] tomorrow. Subsequently the wound itself on the left lower extremity does seem to be greatly improved compared to previous. 03/25/2019 on evaluation  today patient appears to be doing better with regard to his wounds on the bilateral lower extremities. The left is doing excellent the right is also doing better although both still do show some signs of open wounds noted at this point unfortunately. Fortunately there is no signs of active infection at this time. The patient also is not really having any significant pain which is good news. Unfortunately there was some confusion with the referral on vascular disease and as far as getting the patient scheduled there can be contacting him later today to do this [name], [name] ([id number]) fortunately we got this straightened out. 04/01/2019 on evaluation today patient appears to be doing no fevers, chills, nausea, vomiting, or diarrhea. Excellent at this time with regard to his lower extremities. There does not appear to be any open wound at this point which is good news. Fortunately is also no signs of active infection at this time. Overall feel like the patient has done excellent with the compression the problem is every time we got him to this point and then subsequently go to using his own compression things just go right back to where they were. I am not sure how to address this we can try to get an appointment with vascular for 2 weeks now they have yet to call him. Obviously this has become frustrating for the patient as well. I think the issue has just been an honest error as far as scheduling is concerned but nonetheless still worn out the point where I am unsure of which direction we should take. 04/08/2019 on evaluation today patient actually appears to be doing well with regard to his lower extremities. There are no open wounds at this time and things seem to be managing quite nicely as far as the overall edema control is concerned. With that being said he does have his compression socks today for [place] to go ahead and reinitiate therapy in that manner at this point. He is going to be going for  shoes to be measured on Wednesday and then coincidentally he will also be seeing vascular on Thursday. Overall I think this is good news and  again I am hopeful that they will be able to do something for him to help prevent ongoing issues with edema control as well. No fevers, chills, nausea, vomiting, or diarrhea. 04/11/2019 on evaluation today patient actually appears to be doing poorly after just being discharged on Monday of this week. He had been experiencing issues with again blisters especially on the left lower extremity. With that being said he was completely healed and appeared to be doing great this past Monday. He then subsequently has new blisters that formed before his appointment with vascular this morning. He was also measured for shoes in the interim. With that being said we may have figured out what exactly is going on and why he continues to have issues like what we are seeing at this point. He takes his compression stockings off at nighttime and then he ends up having to sleep in his chair for 5-6 hours a night. He sleeps with his feet down he cannot really get him up in the recliner and therefore he is sleeping and the worst possible his position with his feet on the floor for that majority of the time. Again as I explained to him that is about one third at minimum at least one fourth of his day that he spending with his feet dangling down on the ground and the worst possible position they could be. I think this may be what is causing the issue. Subsequently I am leaning toward thinking that he may need a hospital bed in order to elevate his legs. We likely can have to coordinate this with his primary care provider at the [facility]. Readmission: 01/26/2021 this is a patient who presents for repeat evaluation here in the clinic although it is actually been couple of years since have seen him in fact it was December 2020 when I last saw him. Subsequently he never really healed but  did end up being lost to follow-up. He tells me has been having issues ongoing with his lower extremities has bilateral lower extremity lymphedema no real significant or definitive open wounds but in general his lymphedema is way out of control. We were never able to refer him to lymphedema clinic simply due to the fact to be honest we were never able to get him completely healed. I do not see anyone with open wounds. The patient does have evidence of type 2 diabetes mellitus, lymphedema, chronic venous insufficiency, and hypertension. That really has not changed since his last evaluation. 02/09/2021 upon evaluation today patient appears to be doing a little better in regard to his legs although he still having a tremendous amount of drainage especially on the left leg. Fortunately there does not appear to be any evidence of active infection. Of note when we looked into this further it appears that the patient did not have any absorptive dressing on it was just the 4-layer compression wrap. Nonetheless this is probably big part of the issue here. 10/10; he comes in today with 3 large areas on the upper right lower leg likely remanence of denuded blistering under his compression wraps. He has no other wounds on the right. On the left he has the denuded area on the left medial foot and ankle and on the left dorsal foot. Massive lymphedema in both feet dorsally. Using Zetuvit under compression We have increased home health visitation to twice a week to change the dressings and will change it once 02/22/2021 upon evaluation today patient appears to be doing well currently  with regard to his wounds. He has been tolerating the dressing changes without complication. Fortunately there does not appear to be any evidence of active infection which is great news. No fevers, chills, nausea, vomiting, or diarrhea. The biggest issue I see currently is that home health is not putting any medicine on the actual wounds  before wrapping. Electronic Signature(s) Signed: 02/22/2021 3:12:24 PM By: Worthy Keeler PA-C Entered By: Worthy Keeler on 02/22/2021 15:12:24 [name] ([id number]) -------------------------------------------------------------------------------- Physical Exam Details Patient Name: [name], [name] Date of Service: 02/22/2021 10:15 AM Medical Record Number: [id number] Patient Account Number: [id number] Date of Birth/Sex: Mar 01, 1951 (70 y.o. M) Treating RN: [name] Primary Care Provider: Felipa Eth Other Clinician: Referring Provider: Felipa Eth Treating Provider/Extender: Skipper Cliche in Treatment: 3 Constitutional Obese and well-hydrated in no acute distress. Respiratory normal breathing without difficulty. Psychiatric this patient is able to make decisions and demonstrates good insight into disease process. Alert and Oriented x 3. pleasant and cooperative. Notes Upon inspection patient's wound bed showed signs of good granulation epithelization at this point. Fortunately there does not appear to be any signs of active infection systemically which is great news and overall I am extremely pleased. With that being said he does have swelling of the lower extremities even though he is not draining nearly as much as he has been in the past there still is a lot of drainage and again he needs ABD pads and appropriate dressings on these areas. Electronic Signature(s) Signed: 02/22/2021 3:12:51 PM By: Worthy Keeler PA-C Entered By: Worthy Keeler on 02/22/2021 15:12:51 [name] ([id number]) -------------------------------------------------------------------------------- Physician Orders Details Patient Name: [name] Date of Service: 02/22/2021 10:15 AM Medical Record Number: [id number] Patient Account Number: [id number] Date of Birth/Sex: 07-May-1951 (70 y.o. M) Treating RN: [name] Primary Care Provider: Felipa Eth Other  Clinician: Referring Provider: Felipa Eth Treating Provider/Extender: Skipper Cliche in Treatment: 3 Verbal / Phone Orders: No Diagnosis Coding ICD-10 Coding Code Description E11.622 Type 2 diabetes mellitus with other skin ulcer I89.0 Lymphedema, not elsewhere classified I87.2 Venous insufficiency (chronic) (peripheral) L97.822 Non-pressure chronic ulcer of other part of left lower leg with fat layer exposed L97.512 Non-pressure chronic ulcer of other part of right foot with fat layer exposed I10 Essential (primary) hypertension L97.811 Non-pressure chronic ulcer of other part of right lower leg limited to breakdown of skin Follow-up Appointments o Return Appointment in 1 week. [place] for wound care. May utilize formulary equivalent dressing for wound treatment orders unless otherwise specified. Home Health Nurse may visit PRN to address patientos wound care needs. - frequency 3 times per week - patient will be seen once at wound center - home health to see patient 2 times per week on Wed and friday Bathing/ Shower/ Hygiene o May shower; gently cleanse wound with antibacterial soap, rinse and pat dry prior to dressing wounds Edema Control - Lymphedema / Segmental Compressive Device / Other Bilateral Lower Extremities o Optional: One layer of unna paste to top of compression wrap (to act as an anchor). o 4 Layer Compression System Lymphedema. - bi lat, apply zetuvits/ or formulary for extra absorbant dressing around ankles and foot area, 3 times per week o Elevate, Exercise Daily and Avoid Standing for Long Periods of Time. o Elevate legs to the level of the heart and pump ankles as often as possible o Elevate leg(s) parallel to the floor when sitting. Off-Loading o Open toe surgical shoe Wound Treatment Wound #  12 - Lower Leg Wound Laterality: Right, Anterior Cleanser: Wound Cleanser (Generic) 3 x Per Week/30 Days Discharge  Instructions: Wash your hands with soap and water. Remove old dressing, discard into plastic bag and place into trash. Cleanse the wound with Wound Cleanser prior to applying a clean dressing using gauze sponges, not tissues or cotton balls. Do not scrub or use excessive force. Pat dry using gauze sponges, not tissue or cotton balls. Primary Dressing: Xeroform 4x4-HBD (in/in) 3 x Per Week/30 Days Discharge Instructions: Apply Xeroform 4x4-HBD (in/in) as directed Secondary Dressing: ABD Pad 5x9 (in/in) 3 x Per Week/30 Days Discharge Instructions: Cover with ABD pad Compression Wrap: Medichoice 4 layer Compression System, 35-40 mmHG 3 x Per Week/30 Days Discharge Instructions: Apply multi-layer wrap as directed. Wound #13 - Lower Leg Wound Laterality: Right, Medial Cleanser: Wound Cleanser (Generic) 3 x Per Week/30 Days [name], [name] ([id number]) Discharge Instructions: Wash your hands with soap and water. Remove old dressing, discard into plastic bag and place into trash. Cleanse the wound with Wound Cleanser prior to applying a clean dressing using gauze sponges, not tissues or cotton balls. Do not scrub or use excessive force. Pat dry using gauze sponges, not tissue or cotton balls. Primary Dressing: Xeroform 4x4-HBD (in/in) 3 x Per Week/30 Days Discharge Instructions: Apply Xeroform 4x4-HBD (in/in) as directed Secondary Dressing: ABD Pad 5x9 (in/in) 3 x Per Week/30 Days Discharge Instructions: Cover with ABD pad Compression Wrap: Medichoice 4 layer Compression System, 35-40 mmHG 3 x Per Week/30 Days Discharge Instructions: Apply multi-layer wrap as directed. Wound #14 - Lower Leg Wound Laterality: Right, Posterior Cleanser: Wound Cleanser (Generic) 3 x Per Week/30 Days Discharge Instructions: Wash your hands with soap and water. Remove old dressing, discard into plastic bag and place into trash. Cleanse the wound with Wound Cleanser prior to applying a clean dressing using gauze sponges, not  tissues or cotton balls. Do not scrub or use excessive force. Pat dry using gauze sponges, not tissue or cotton balls. Primary Dressing: Xeroform 4x4-HBD (in/in) 3 x Per Week/30 Days Discharge Instructions: Apply Xeroform 4x4-HBD (in/in) as directed Secondary Dressing: ABD Pad 5x9 (in/in) 3 x Per Week/30 Days Discharge Instructions: Cover with ABD pad Compression Wrap: Medichoice 4 layer Compression System, 35-40 mmHG 3 x Per Week/30 Days Discharge Instructions: Apply multi-layer wrap as directed. Electronic Signature(s) Signed: 02/22/2021 11:00:37 AM By: [name] RN Signed: 02/22/2021 5:23:05 PM By: Worthy Keeler PA-C Entered By: [name] on 02/22/2021 11:00:37 [name] ([id number]) -------------------------------------------------------------------------------- Problem List Details Patient Name: [name] Date of Service: 02/22/2021 10:15 AM Medical Record Number: [id number] Patient Account Number: [id number] Date of Birth/Sex: Feb 24, 1951 (70 y.o. M) Treating RN: [name] Primary Care Provider: Felipa Eth Other Clinician: Referring Provider: Felipa Eth Treating Provider/Extender: Skipper Cliche in Treatment: 3 Active Problems ICD-10 Encounter Code Description Active Date MDM Diagnosis E11.622 Type 2 diabetes mellitus with other skin ulcer 01/26/2021 No Yes I89.0 Lymphedema, not elsewhere classified 01/26/2021 No Yes I87.2 Venous insufficiency (chronic) (peripheral) 01/26/2021 No Yes L97.822 Non-pressure chronic ulcer of other part of left lower leg with fat layer 01/26/2021 No Yes exposed L97.512 Non-pressure chronic ulcer of other part of right foot with fat layer 01/26/2021 No Yes exposed [place] (primary) hypertension 01/26/2021 No Yes L97.811 Non-pressure chronic ulcer of other part of right lower leg limited to 02/15/2021 No Yes breakdown of skin Inactive Problems Resolved Problems Electronic Signature(s) Signed: 02/22/2021 10:13:07  AM By: Worthy Keeler PA-C Entered By: Worthy Keeler on 02/22/2021  10:13:06 [name], [name] ([id number]) -------------------------------------------------------------------------------- Progress Note Details Patient Name: [name], [name] Date of Service: 02/22/2021 10:15 AM Medical Record Number: [id number] Patient Account Number: [id number] Date of Birth/Sex: 04/03/1951 (70 y.o. M) Treating RN: [name] Primary Care Provider: Felipa Eth Other Clinician: Referring Provider: Felipa Eth Treating Provider/Extender: Skipper Cliche in Treatment: 3 Subjective Chief Complaint Information obtained from Patient Left LE ulcers History of Present Illness (HPI) 70 year old male who presented to the ER with bilateral lower extremity blisters which had started last week. he has a past medical history of leukemia, diabetes mellitus, hypertension, edema of both lower extremities, his recurrent skin infections, peripheral vascular disease, coronary artery disease, congestive heart failure and peripheral neuropathy. in the ER he was given Rocephin and put on Silvadene cream. he was put on oral doxycycline and was asked to follow-up with the [facility]. His last hemoglobin A1c was 6.6 in December and he checks his blood sugar once a week. He does not have any physicians outside the [place] system. He does not recall any vascular duplex studies done either for arterial or venous disease but was told to wear compression stockings which he does not use 05/30/2016 -- we have not yet received any of his notes from the [facility] hospital system and his arterial and venous duplex studies are scheduled here in [place] around mid February. We are unable to have his insurance accepted by home health agencies and hence he is getting dressings only once a week. 06/06/16 -- -- I received a call from the patient's PCP at the [facility] and spoke to Dr. Garvin Fila, phone number [phone] and fax number  [phone]. She confirmed that no vascular testing was done over the last 5 years and she would be happy to do them if the patient did want them to be done at the [place] and we could fax him a request. Readmission: 70 year old male seen by as in February of this year and was referred to vein and vascular for studies and opinion from the vascular surgeons. The patient returns today with a fresh problem having had blisters on his left lower extremity which have been there for about 5 days and he clearly states that he has been wearing his compression stockings as advised though he could not read the moderate compression and has been wearing light compression. Review of his electronic medical records note that he had lower extremity arterial duplex examination done on 06/23/2016 which showed no hemodynamically significant stenosis in the bilateral lower extremity arterial system. He also had a lower extremity venous reflux examination done on 07/07/2016 and it was noted that he had venous incompetence in the right great saphenous vein and bilateral common femoral veins. Patient was seen by Dr. Tamala Julian on the same day and for some reason his notes do not reflect the venous studies or the arterial studies and he recommended patient do a venous duplex ultrasound to look for reflux and return to see him.he would also consider a lymph pump if required. The patient was told that his workup was normal and hence the patient canceled his follow-up appointment. 02/03/17 on evaluation today patient left medial lower extremity blister appears to be doing about the same. It is still continuing to drain and there's still the blistered skin covering the wound bed which is making it difficult for the alternate to do its job. Fortunately there is no evidence of cellulitis. No fevers chills noted. Patient states in general he is not having any significant  discomfort. Patient's lower extremity arterial duplex exam revealed  that patient was hemodynamically stable with no evidence of stenosis in regard to the bilateral lower extremities. The lower extremity venous reflux exam revealed the patient had venous incontinence noted in the right greater saphenous and bilateral common femoral vein. There is no evidence of deep or superficial vein thrombosis in the bilateral lower extremities. Readmission: 11/12/18 Patient presents for evaluation our clinic today concerning issues that he is having with his left lower extremity. He tells me that a couple weeks ago he began developing blisters on the left lower extremity along with increased swelling. He typically wears his compression stockings on a regular basis is previously been evaluated both here as well is with vascular surgery they would recommend lymphedema pumps but unfortunately that somehow fell through and he never heard anything back from that. Nonetheless I think lymphedema pumps would be beneficial for this patient. He does have a history of hypertension and diabetes. Obviously the chronic venous stasis and lymphedema as well. At this point the blisters have been given in more trouble he states sometimes when the blisters openings able to clean it down with alcohol and it will dry out and do well. Unfortunately that has not been the case this time. He is having some discomfort although this mean these with cleaning the areas he doesn't have discomfort just on a regular basis. He has not been able to wear his compression stockings since the blisters arose due to the fact that of course it will drain into the socks causing additional issues and he didn't have any way to wrap this otherwise. He has increased to taking his Lasix every day instead of every other day. He sees his primary care provider later this month as well. No fevers, chills, nausea, or vomiting noted at this time. 11/19/18-Patient returns at 1 week, per intake RN the amount of seepage into the  compression wraps was definitely improved, overall all the wounds are measuring smaller but continuing silver alginate to the wounds as primary dressing 11/26/18 on evaluation today patient appears to be doing quite well in regard to his left lower [place] ulcers. In fact of the areas that were noted initially he only has two regions still open. There is no evidence of active infection at this time. He still is not heard anything from the company regarding lymphedema pumps as of yet. Again as previously seen vascular they have not recommended any surgical intervention. [name], [name] ([id number]) 12/03/2018 on evaluation today patient actually appears to be doing quite well with regard to his lower extremity ulcers. In fact most of the areas appear to be healed the one spot which does not seem to be completely healed I am unsure of whether or not this is really draining that much but nonetheless there does not appear to be any signs of infection or significant drainage at this point. There is no sign of fever, chills, nausea, vomiting, or diarrhea. Overall I am pleased with how things have progressed I think is very close to being able to transition to his home compression stockings. 12/10/2018 upon evaluation today patient appears to be doing quite well with regard to his left lower extremity. He has been tolerating the dressing changes without complication. Fortunately there is no signs of active infection at this time. He appears after thorough evaluation of his leg to only have 1 small area that remains open at this point everything else appears to be almost completely closed. He still  have significant swelling of the left lower extremity. We had discussed discussing this with his primary care provider he is not able to see her in person they were at the [facility] and right now the [place] is not seeing patients on site. According to the patient anyway. Subsequently he did speak with her apparently  and his primary care provider feels that he may likely have a DVT. With that being said she has not seen his leg she is just going off of his history. Nonetheless that is a concern that the patient now has as well and while I do not feel the DVT is likely we can definitely ensure that that is not the case I will go ahead and see about putting that order in today. Nonetheless otherwise I am in a recommend that we continue with the current wound care measures including the compression therapy most likely. We just need to ensure that his leg is indeed free of any DVTs. 12/17/2018 on evaluation today patient actually appears to be completely healed today. He does have 2 very small areas of blistering although this is not anything too significant at this point which is good news. With that being said I am in agreement with the fact that I think he is completely healed at this point. He does want to get back into his compression stocking. The good news is we have gotten approval from insurance for his lymphedema pumps we received a letter since last saw him last week. The other good news is his study did come back and showed no evidence of a DVT. 12/20/2018 on evaluation today patient presents for follow-up concerning his ongoing issues with his left lower extremity. He was actually discharged last Friday and did fairly well until he states blisters opened this morning. He tells me he has been wearing his compression stocking although he has a hard time getting this on. There does not appear to be any signs of active infection at this time. No fevers, chills, nausea, vomiting, or diarrhea. 12/27/2018 on evaluation today patient appears to be doing very well with regard to his swelling of the left lower extremity the 4 layer compression wrap seems to have been beneficial for him. Fortunately there is no signs of active infection at this time. Patient has been tolerating the compression wrap without  complication and his foot swelling in particular appears to be greatly improved. He does still have a wound on the lateral portion of his left leg I believe this is more of a blister that has now reopened. 01/03/2019 on evaluation today patient actually appears to be doing excellent in regard to his left lower extremity. He did receive his compression pumps and is actually use this 7 times since he was last here in the office. On top of the compression wrap he is now roughly 3 cm better at the calf and 2 cm better at the ankle he also states that his foot seem to go an issue better without even having to use a shoe horn. Obviously I think this is all evidence that he is doing excellent in this regard. The other good news is he does not appear to have anything open today as far as wounds are concerned. 01/15/2019 on evaluation today patient appears to be doing more poorly yet again with regard to his left lower extremity. He has developed new wounds again after being discharged just recently. Unfortunately this continues to be the case that he will heal and  then have subsequent new wounds. The last time I was hopeful that he may not end up coming back too quickly especially since he states he has been using his lymphedema pumps along with wearing his compression. Nonetheless he had a blister on the back of his leg that popped up on the left and this has opened up into an ulceration it is quite painful. 01/22/19 on evaluation today patient actually appears to be doing well with regard to his wound on the left lower extremity. He's been tolerating the dressing changes without complication including the compression wrap in the wound appears to be significantly smaller today which is great news. Overall very pleased in this regard. 01/29/2019 on evaluation today patient appears to be doing well with regard to his left posterior lower extremity ulcer. He has been tolerating the dressing changes without  complication. This is not completely healed but is getting much closer. We did order a Farrow wrap 4000 for him he has received this and has it with him today although I am not sure we are quite ready to start him on that as of yet. We are very close. 02/05/2019 on evaluation today patient actually appears to be doing quite well with regard to his left posterior lower extremity ulcer. He still has a very tiny opening remaining but the fortunate thing is he seems to be healing quite nicely. He also did get his Farrow wrap which I am hoping will help with his edema control as well at home. Fortunately there is no evidence of active infection. 02/12/2019 patient and fortunately appears to be doing poorly in regard to his wounds of the left lower extremity. He was very close to healing therefore we attempted to use his Velcro compression wraps continuing with lymphedema pumps at home. Unfortunately that does not seem to have done very well for him. He tells me that he wore them all the time but again I am not sure why if that is the case that he is having such significant edema. He is still on his fluid pills as well. With that being said there is no obvious sign of infection although I do wonder about the possibility of infection at this time as well. 02/19/2019 unfortunately upon evaluation today patient appears to be doing more poorly with regard to his left lower extremity. He is not showing signs of significant improvement and I think the biggest issue here is that he does have an infection that appears to likely be Pseudomonas. That is based on the blue-green drainage that were noted at this time. Unfortunately the antibiotic that has been on is not going to take care of this at all. I think they will get a need to switch him to either Levaquin or Cipro and this was discussed with the patient. 02/26/2019 on evaluation today patient's lower extremity on the left appears to be doing significantly better  as compared to last evaluation. Fortunately there is no signs of active infection at this time. He has been tolerating the compression wrap without complication in fact he made it the whole week at this point. He is showing signs of excellent improvement I am very happy in this regard. With that being said he is having some issues with infection we did review the results of his culture which I noted today. He did have a positive finding for Enterobacter as well as Alcaligenes faecalis. Fortunately the Levaquin that I placed him on will work for both which is great news.  There is no signs of systemic infection at this point. 10/30; left posterior leg wound in the setting of very significant edema and what looks like chronic venous inflammation. He has compression pumps but does not use them. We have been using 3 layer compression. Silver alginate to the wound as the primary dressing 03/18/2019 on evaluation today patient appears to be doing a little better compared to last time I saw him. He really has not been using his compression pumps he tells me that he is having too much discomfort. He has been keeping his wraps on however. He is only been taking his fluid pills every other day because he states they are not really helping and he has an appointment with his primary care provider at the [facility] tomorrow. Subsequently the wound itself on the left lower extremity does seem to be greatly improved compared to previous. [name], [name] ([id number]) 03/25/2019 on evaluation today patient appears to be doing better with regard to his wounds on the bilateral lower extremities. The left is doing excellent the right is also doing better although both still do show some signs of open wounds noted at this point unfortunately. Fortunately there is no signs of active infection at this time. The patient also is not really having any significant pain which is good news. Unfortunately there was some confusion with the  referral on vascular disease and as far as getting the patient scheduled there can be contacting him later today to do this fortunately we got this straightened out. 04/01/2019 on evaluation today patient appears to be doing no fevers, chills, nausea, vomiting, or diarrhea. Excellent at this time with regard to his lower extremities. There does not appear to be any open wound at this point which is good news. Fortunately is also no signs of active infection at this time. Overall feel like the patient has done excellent with the compression the problem is every time we got him to this point and then subsequently go to using his own compression things just go right back to where they were. I am not sure how to address this we can try to get an appointment with vascular for 2 weeks now they have yet to call him. Obviously this has become frustrating for the patient as well. I think the issue has just been an honest error as far as scheduling is concerned but nonetheless still worn out the point where I am unsure of which direction we should take. 04/08/2019 on evaluation today patient actually appears to be doing well with regard to his lower extremities. There are no open wounds at this time and things seem to be managing quite nicely as far as the overall edema control is concerned. With that being said he does have his compression socks today for [place] to go ahead and reinitiate therapy in that manner at this point. He is going to be going for shoes to be measured on Wednesday and then coincidentally he will also be seeing vascular on Thursday. Overall I think this is good news and again I am hopeful that they will be able to do something for him to help prevent ongoing issues with edema control as well. No fevers, chills, nausea, vomiting, or diarrhea. 04/11/2019 on evaluation today patient actually appears to be doing poorly after just being discharged on Monday of this week. He had been experiencing  issues with again blisters especially on the left lower extremity. With that being said he was completely healed and appeared to  be doing great this past Monday. He then subsequently has new blisters that formed before his appointment with vascular this morning. He was also measured for shoes in the interim. With that being said we may have figured out what exactly is going on and why he continues to have issues like what we are seeing at this point. He takes his compression stockings off at nighttime and then he ends up having to sleep in his chair for 5-6 hours a night. He sleeps with his feet down he cannot really get him up in the recliner and therefore he is sleeping and the worst possible his position with his feet on the floor for that majority of the time. Again as I explained to him that is about one third at minimum at least one fourth of his day that he spending with his feet dangling down on the ground and the worst possible position they could be. I think this may be what is causing the issue. Subsequently I am leaning toward thinking that he may need a hospital bed in order to elevate his legs. We likely can have to coordinate this with his primary care provider at the [facility]. Readmission: 01/26/2021 this is a patient who presents for repeat evaluation here in the clinic although it is actually been couple of years since have seen him in fact it was December 2020 when I last saw him. Subsequently he never really healed but did end up being lost to follow-up. He tells me has been having issues ongoing with his lower extremities has bilateral lower extremity lymphedema no real significant or definitive open wounds but in general his lymphedema is way out of control. We were never able to refer him to lymphedema clinic simply due to the fact to be honest we were never able to get him completely healed. I do not see anyone with open wounds. The patient does have evidence of type 2  diabetes mellitus, lymphedema, chronic venous insufficiency, and hypertension. That really has not changed since his last evaluation. 02/09/2021 upon evaluation today patient appears to be doing a little better in regard to his legs although he still having a tremendous amount of drainage especially on the left leg. Fortunately there does not appear to be any evidence of active infection. Of note when we looked into this further it appears that the patient did not have any absorptive dressing on it was just the 4-layer compression wrap. Nonetheless this is probably big part of the issue here. 10/10; he comes in today with 3 large areas on the upper right lower leg likely remanence of denuded blistering under his compression wraps. He has no other wounds on the right. On the left he has the denuded area on the left medial foot and ankle and on the left dorsal foot. Massive lymphedema in both feet dorsally. Using Zetuvit under compression We have increased home health visitation to twice a week to change the dressings and will change it once 02/22/2021 upon evaluation today patient appears to be doing well currently with regard to his wounds. He has been tolerating the dressing changes without complication. Fortunately there does not appear to be any evidence of active infection which is great news. No fevers, chills, nausea, vomiting, or diarrhea. The biggest issue I see currently is that home health is not putting any medicine on the actual wounds before wrapping. Objective Constitutional Obese and well-hydrated in no acute distress. Vitals Time Taken: 10:21 AM, Height: 73 in, Weight:  312 lbs, BMI: 41.2, Temperature: 98.1 F, Pulse: 77 bpm, Respiratory Rate: 18 breaths/min, Blood Pressure: 124/75 mmHg. Respiratory normal breathing without difficulty. [name], [name] ([id number]) Psychiatric this patient is able to make decisions and demonstrates good insight into disease process. Alert and  Oriented x 3. pleasant and cooperative. General Notes: Upon inspection patient's wound bed showed signs of good granulation epithelization at this point. Fortunately there does not appear to be any signs of active infection systemically which is great news and overall I am extremely pleased. With that being said he does have swelling of the lower extremities even though he is not draining nearly as much as he has been in the past there still is a lot of drainage and again he needs ABD pads and appropriate dressings on these areas. Integumentary (Hair, Skin) Wound #12 status is Open. Original cause of wound was Gradually Appeared. The date acquired was: 02/12/2021. The wound has been in treatment 1 weeks. The wound is located on the Right,Anterior Lower Leg. The wound measures 5cm length x 2cm width x 0.1cm depth; 7.854cm^2 area and 0.785cm^3 volume. There is no tunneling or undermining noted. There is a medium amount of serosanguineous drainage noted. There is no granulation within the wound bed. There is no necrotic tissue within the wound bed. Wound #13 status is Open. Original cause of wound was Gradually Appeared. The date acquired was: 02/12/2021. The wound has been in treatment 1 weeks. The wound is located on the Right,Medial Lower Leg. The wound measures 4cm length x 2.7cm width x 0.1cm depth; 8.482cm^2 area and 0.848cm^3 volume. There is Fat Layer (Subcutaneous Tissue) exposed. There is no tunneling or undermining noted. There is a medium amount of serosanguineous drainage noted. There is large (67-100%) granulation within the wound bed. There is no necrotic tissue within the wound bed. Wound #14 status is Open. Original cause of wound was Gradually Appeared. The date acquired was: 02/12/2021. The wound has been in treatment 1 weeks. The wound is located on the Right,Posterior Lower Leg. The wound measures 0.5cm length x 3cm width x 0.1cm depth; 1.178cm^2 area and 0.118cm^3 volume. There is  Fat Layer (Subcutaneous Tissue) exposed. There is no tunneling or undermining noted. There is a medium amount of serosanguineous drainage noted. There is large (67-100%) granulation within the wound bed. There is no necrotic tissue within the wound bed. Assessment Active Problems ICD-10 Type 2 diabetes mellitus with other skin ulcer Lymphedema, not elsewhere classified Venous insufficiency (chronic) (peripheral) Non-pressure chronic ulcer of other part of left lower leg with fat layer exposed Non-pressure chronic ulcer of other part of right foot with fat layer exposed Essential (primary) hypertension Non-pressure chronic ulcer of other part of right lower leg limited to breakdown of skin Procedures Wound #12 Pre-procedure diagnosis of Wound #12 is a Diabetic Wound/Ulcer of the Lower Extremity located on the Right,Anterior Lower Leg . There was a Four Layer Compression Therapy Procedure by [name], RN. Post procedure Diagnosis Wound #12: Same as Pre-Procedure Wound #13 Pre-procedure diagnosis of Wound #13 is a Diabetic Wound/Ulcer of the Lower Extremity located on the Right,Medial Lower Leg . There was a Four Layer Compression Therapy Procedure by [name], RN. Post procedure Diagnosis Wound #13: Same as Pre-Procedure Wound #14 Pre-procedure diagnosis of Wound #14 is a Diabetic Wound/Ulcer of the Lower Extremity located on the Right,Posterior Lower Leg . There was a Four Layer Compression Therapy Procedure by [name], RN. Post procedure Diagnosis Wound #14: Same as Pre-Procedure There was a  Four Layer Compression Therapy Procedure by [name], RN. Post procedure Diagnosis Wound #: Same as Pre-Procedure [name], [name] ([id number]) Plan Follow-up Appointments: Return Appointment in 1 week. Home Health: [facility] for wound care. May utilize formulary equivalent dressing for wound treatment orders unless otherwise specified. Home Health Nurse may visit PRN to  address patient s wound care needs. - frequency 3 times per week - patient will be seen once at wound center - home health to see patient 2 times per week on Wed and friday Bathing/ Shower/ Hygiene: May shower; gently cleanse wound with antibacterial soap, rinse and pat dry prior to dressing wounds Edema Control - Lymphedema / Segmental Compressive Device / Other: Optional: One layer of unna paste to top of compression wrap (to act as an anchor). 4 Layer Compression System Lymphedema. - bi lat, apply zetuvits/ or formulary for extra absorbant dressing around ankles and foot area, 3 times per week Elevate, Exercise Daily and Avoid Standing for Long Periods of Time. Elevate legs to the level of the heart and pump ankles as often as possible Elevate leg(s) parallel to the floor when sitting. Off-Loading: Open toe surgical shoe WOUND #12: - Lower Leg Wound Laterality: Right, Anterior Cleanser: Wound Cleanser (Generic) 3 x Per Week/30 Days Discharge Instructions: Wash your hands with soap and water. Remove old dressing, discard into plastic bag and place into trash. Cleanse the wound with Wound Cleanser prior to applying a clean dressing using gauze sponges, not tissues or cotton balls. Do not scrub or use excessive force. Pat dry using gauze sponges, not tissue or cotton balls. Primary Dressing: Xeroform 4x4-HBD (in/in) 3 x Per Week/30 Days Discharge Instructions: Apply Xeroform 4x4-HBD (in/in) as directed Secondary Dressing: ABD Pad 5x9 (in/in) 3 x Per Week/30 Days Discharge Instructions: Cover with ABD pad Compression Wrap: Medichoice 4 layer Compression System, 35-40 mmHG 3 x Per Week/30 Days Discharge Instructions: Apply multi-layer wrap as directed. WOUND #13: - Lower Leg Wound Laterality: Right, Medial Cleanser: Wound Cleanser (Generic) 3 x Per Week/30 Days Discharge Instructions: Wash your hands with soap and water. Remove old dressing, discard into plastic bag and place into  trash. Cleanse the wound with Wound Cleanser prior to applying a clean dressing using gauze sponges, not tissues or cotton balls. Do not scrub or use excessive force. Pat dry using gauze sponges, not tissue or cotton balls. Primary Dressing: Xeroform 4x4-HBD (in/in) 3 x Per Week/30 Days Discharge Instructions: Apply Xeroform 4x4-HBD (in/in) as directed Secondary Dressing: ABD Pad 5x9 (in/in) 3 x Per Week/30 Days Discharge Instructions: Cover with ABD pad Compression Wrap: Medichoice 4 layer Compression System, 35-40 mmHG 3 x Per Week/30 Days Discharge Instructions: Apply multi-layer wrap as directed. WOUND #14: - Lower Leg Wound Laterality: Right, Posterior Cleanser: Wound Cleanser (Generic) 3 x Per Week/30 Days Discharge Instructions: Wash your hands with soap and water. Remove old dressing, discard into plastic bag and place into trash. Cleanse the wound with Wound Cleanser prior to applying a clean dressing using gauze sponges, not tissues or cotton balls. Do not scrub or use excessive force. Pat dry using gauze sponges, not tissue or cotton balls. Primary Dressing: Xeroform 4x4-HBD (in/in) 3 x Per Week/30 Days Discharge Instructions: Apply Xeroform 4x4-HBD (in/in) as directed Secondary Dressing: ABD Pad 5x9 (in/in) 3 x Per Week/30 Days Discharge Instructions: Cover with ABD pad Compression Wrap: Medichoice 4 layer Compression System, 35-40 mmHG 3 x Per Week/30 Days Discharge Instructions: Apply multi-layer wrap as directed. 1. I am going to recommend  that we going continue with the wound care measures as before and the patient is in agreement the plan this includes the use of Xeroform to the open wounds on the leg which seems to be doing a good job. 2. I am also can recommend ABD pads or Zetuvit to cover the areas that are draining especially in regard to the left foot this is of utmost importance. 3. We also continue with the 4-layer compression wrap. We will see patient back for  reevaluation in 1 week here in the clinic. If anything worsens or changes patient will contact our office for additional recommendations. Electronic Signature(s) Signed: 02/22/2021 3:13:21 PM By: Worthy Keeler PA-C Entered By: Worthy Keeler on 02/22/2021 15:13:20 [name] ([id number]) -------------------------------------------------------------------------------- SuperBill Details Patient Name: [name] Date of Service: 02/22/2021 Medical Record Number: [id number] Patient Account Number: [id number] Date of Birth/Sex: 1950/09/07 (70 y.o. M) Treating RN: [name] Primary Care Provider: Felipa Eth Other Clinician: Referring Provider: Felipa Eth Treating Provider/Extender: Skipper Cliche in Treatment: 3 Diagnosis Coding ICD-10 Codes Code Description [phone] Type 2 diabetes mellitus with other skin ulcer I89.0 Lymphedema, not elsewhere classified I87.2 Venous insufficiency (chronic) (peripheral) L97.822 Non-pressure chronic ulcer of other part of left lower leg with fat layer exposed L97.512 Non-pressure chronic ulcer of other part of right foot with fat layer exposed I10 Essential (primary) hypertension L97.811 Non-pressure chronic ulcer of other part of right lower leg limited to breakdown of skin Facility Procedures CPT4: Description Modifier Quantity Code [id number] BILATERAL: Application of multi-layer venous compression system; leg (below knee), including 1 ankle and foot. Physician Procedures CPT4 Code: [id number] Description: 07622 - WC PHYS LEVEL 4 - EST PT Modifier: Quantity: 1 CPT4 Code: Description: ICD-10 Diagnosis Description E11.622 Type 2 diabetes mellitus with other skin ulcer I89.0 Lymphedema, not elsewhere classified I87.2 Venous insufficiency (chronic) (peripheral) L97.822 Non-pressure chronic ulcer of other part of left lower  leg with fat layer Modifier: exposed Quantity: Electronic Signature(s) Signed: 02/22/2021 3:13:43 PM  By: Worthy Keeler PA-C Previous Signature: 02/22/2021 11:01:02 AM Version By: [name] RN Entered By: Worthy Keeler on 02/22/2021 15:13:42

## 2021-02-25 ENCOUNTER — Other Ambulatory Visit: Payer: Self-pay

## 2021-02-25 ENCOUNTER — Ambulatory Visit: Payer: PPO | Admitting: Podiatry

## 2021-02-25 ENCOUNTER — Encounter: Payer: Self-pay | Admitting: Podiatry

## 2021-02-25 DIAGNOSIS — B351 Tinea unguium: Secondary | ICD-10-CM | POA: Diagnosis not present

## 2021-02-25 DIAGNOSIS — I872 Venous insufficiency (chronic) (peripheral): Secondary | ICD-10-CM | POA: Diagnosis not present

## 2021-02-25 DIAGNOSIS — M79675 Pain in left toe(s): Secondary | ICD-10-CM | POA: Diagnosis not present

## 2021-02-25 DIAGNOSIS — M79674 Pain in right toe(s): Secondary | ICD-10-CM | POA: Diagnosis not present

## 2021-02-25 DIAGNOSIS — N179 Acute kidney failure, unspecified: Secondary | ICD-10-CM

## 2021-02-25 DIAGNOSIS — E11628 Type 2 diabetes mellitus with other skin complications: Secondary | ICD-10-CM

## 2021-02-25 NOTE — Progress Notes (Signed)
This patient returns to my office for at risk foot care.  This patient requires this care by a professional since this patient will be at risk due to having diabetes and venous insufficiency.  This patient is unable to cut nails himself since the patient cannot reach his nails.These nails are painful walking and wearing shoes.  Patient is wearing unna boots  B/L for his severe lymphedema.. This patient presents for at risk foot care today.  General Appearance  Alert, conversant and in no acute stress.  Vascular  Deferred due to unna boots.  Neurologic  Deferred due to unna boots.  Nails Thick disfigured discolored nails with subungual debris  from hallux to fifth toes bilaterally. No evidence of bacterial infection or drainage bilaterally.  Orthopedic  No limitations of motion  feet .  No crepitus or effusions noted.  No bony pathology or digital deformities noted.  Skin  normotropic skin with no porokeratosis noted bilaterally.  No signs of infections or ulcers noted.  Severe crusting and masceration at the dorsal aspect of toe.     Onychomycosis  Pain in right toes  Pain in left toes  Consent was obtained for treatment procedures.   Mechanical debridement of nails 1-5  bilaterally performed with a nail nipper.  Filed with dremel without incident.  Patient says he has an appointment tomorrow and may be hospitalized for a few weeks.   Return office visit    3 months                 Told patient to return for periodic foot care and evaluation due to potential at risk complications.   Gardiner [name] DPM

## 2021-02-26 DIAGNOSIS — I890 Lymphedema, not elsewhere classified: Secondary | ICD-10-CM | POA: Diagnosis not present

## 2021-02-26 DIAGNOSIS — I509 Heart failure, unspecified: Secondary | ICD-10-CM | POA: Diagnosis not present

## 2021-02-26 DIAGNOSIS — E11622 Type 2 diabetes mellitus with other skin ulcer: Secondary | ICD-10-CM | POA: Diagnosis present

## 2021-02-26 DIAGNOSIS — L97512 Non-pressure chronic ulcer of other part of right foot with fat layer exposed: Secondary | ICD-10-CM | POA: Diagnosis not present

## 2021-02-26 DIAGNOSIS — L97822 Non-pressure chronic ulcer of other part of left lower leg with fat layer exposed: Secondary | ICD-10-CM | POA: Diagnosis not present

## 2021-02-26 DIAGNOSIS — I872 Venous insufficiency (chronic) (peripheral): Secondary | ICD-10-CM | POA: Diagnosis not present

## 2021-02-26 DIAGNOSIS — Z856 Personal history of leukemia: Secondary | ICD-10-CM | POA: Diagnosis not present

## 2021-02-26 DIAGNOSIS — I110 Hypertensive heart disease with heart failure: Secondary | ICD-10-CM | POA: Diagnosis not present

## 2021-02-26 DIAGNOSIS — E1142 Type 2 diabetes mellitus with diabetic polyneuropathy: Secondary | ICD-10-CM | POA: Diagnosis not present

## 2021-02-26 DIAGNOSIS — E1151 Type 2 diabetes mellitus with diabetic peripheral angiopathy without gangrene: Secondary | ICD-10-CM | POA: Diagnosis not present

## 2021-03-01 ENCOUNTER — Other Ambulatory Visit
Admission: RE | Admit: 2021-03-01 | Discharge: 2021-03-01 | Disposition: A | Discharge status: Home / Self Care | Payer: No Typology Code available for payment source | Source: Ambulatory Visit | Attending: Physician Assistant | Admitting: Physician Assistant

## 2021-03-01 ENCOUNTER — Other Ambulatory Visit: Payer: Self-pay

## 2021-03-01 ENCOUNTER — Encounter: Payer: PPO | Admitting: Physician Assistant

## 2021-03-01 DIAGNOSIS — L97219 Non-pressure chronic ulcer of right calf with unspecified severity: Secondary | ICD-10-CM | POA: Diagnosis not present

## 2021-03-01 DIAGNOSIS — L97811 Non-pressure chronic ulcer of other part of right lower leg limited to breakdown of skin: Secondary | ICD-10-CM | POA: Diagnosis not present

## 2021-03-01 DIAGNOSIS — B999 Unspecified infectious disease: Secondary | ICD-10-CM | POA: Diagnosis not present

## 2021-03-01 NOTE — Progress Notes (Addendum)
JYMIR, DUNAJ ([id number]) Visit Report for 03/01/2021 Chief Complaint Document Details Patient Name: [name], [name] Date of Service: 03/01/2021 10:15 AM Medical Record Number: [id number] Patient Account Number: [id number] Date of Birth/Sex: [birth year] (70 y.o. M) Treating RN: [name] Primary Care Provider: Felipa Eth Other Clinician: Referring Provider: Felipa Eth Treating Provider/Extender: Skipper Cliche in Treatment: 4 Information Obtained from: Patient Chief Complaint Left LE ulcers Electronic Signature(s) Signed: 03/01/2021 10:54:12 AM By: Worthy Keeler PA-C Entered By: Worthy Keeler on 03/01/2021 10:54:12 [name] ([id number]) -------------------------------------------------------------------------------- HPI Details Patient Name: [name] Date of Service: 03/01/2021 10:15 AM Medical Record Number: [id number] Patient Account Number: [id number] Date of Birth/Sex: 11/07/1950 (71 y.o. M) Treating RN: [name] Primary Care Provider: Felipa Eth Other Clinician: Referring Provider: Felipa Eth Treating Provider/Extender: Skipper Cliche in Treatment: 4 History of Present Illness HPI Description: 70 year old male who presented to the ER with bilateral lower extremity blisters which had started last week. he has a past medical history of leukemia, diabetes mellitus, hypertension, edema of both lower extremities, his recurrent skin infections, peripheral vascular disease, coronary artery disease, congestive heart failure and peripheral neuropathy. in the ER he was given Rocephin and put on Silvadene cream. he was put on oral doxycycline and was asked to follow-up with the [facility]. His last hemoglobin A1c was 6.6 in December and he checks his blood sugar once a week. He does not have any physicians outside the [place] system. He does not recall any vascular duplex studies done either for arterial or venous disease but was told to wear  compression stockings which he does not use 05/30/2016 -- we have not yet received any of his notes from the [facility] system and his arterial and venous duplex studies are scheduled here in [place] around mid February. We are unable to have his insurance accepted by home health agencies and hence he is getting dressings only once a week. 06/06/16 -- -- I received a call from the patient's PCP at the [facility] at [facility] and spoke to Dr. Garvin Fila, phone number [phone] and fax number [phone]. She confirmed that no vascular testing was done over the last 5 years and she would be happy to do them if the patient did want them to be done at the [place] and we could fax him a request. Readmission: 70 year old male seen by as in February of this year and was referred to vein and vascular for studies and opinion from the vascular surgeons. The patient returns today with a fresh problem having had blisters on his left lower extremity which have been there for about 5 days and he clearly states that he has been wearing his compression stockings as advised though he could not read the moderate compression and has been wearing light compression. Review of his electronic medical records note that he had lower extremity arterial duplex examination done on 06/23/2016 which showed no hemodynamically significant stenosis in the bilateral lower extremity arterial system. He also had a lower extremity venous reflux examination done on 07/07/2016 and it was noted that he had venous incompetence in the right great saphenous vein and bilateral common femoral veins. Patient was seen by Dr. Tamala Julian on the same day and for some reason his notes do not reflect the venous studies or the arterial studies and he recommended patient do a venous duplex ultrasound to look for reflux and return to see him.he would also consider a lymph pump if required. The patient was told that his workup  was normal and hence the patient  canceled his follow-up appointment. 02/03/17 on evaluation today patient left medial lower extremity blister appears to be doing about the same. It is still continuing to drain and there's still the blistered skin covering the wound bed which is making it difficult for the alternate to do its job. Fortunately there is no evidence of cellulitis. No fevers chills noted. Patient states in general he is not having any significant discomfort. Patient's lower extremity arterial duplex exam revealed that patient was hemodynamically stable with no evidence of stenosis in regard to the bilateral lower extremities. The lower extremity venous reflux exam revealed the patient had venous incontinence noted in the right greater saphenous and bilateral common femoral vein. There is no evidence of deep or superficial vein thrombosis in the bilateral lower extremities. Readmission: 11/12/18 Patient presents for evaluation our clinic today concerning issues that he is having with his left lower extremity. He tells me that a couple weeks ago he began developing blisters on the left lower extremity along with increased swelling. He typically wears his compression stockings on a regular basis is previously been evaluated both here as well is with vascular surgery they would recommend lymphedema pumps but unfortunately that somehow fell through and he never heard anything back from that. Nonetheless I think lymphedema pumps would be beneficial for this patient. He does have a history of hypertension and diabetes. Obviously the chronic venous stasis and lymphedema as well. At this point the blisters have been given in more trouble he states sometimes when the blisters openings able to clean it down with alcohol and it will dry out and do well. Unfortunately that has not been the case this time. He is having some discomfort although this mean these with cleaning the areas he doesn't have discomfort just on a regular basis. He  has not been able to wear his compression stockings since the blisters arose due to the fact that of course it will drain into the socks causing additional issues and he didn't have any way to wrap this otherwise. He has increased to taking his Lasix every day instead of every other day. He sees his primary care provider later this month as well. No fevers, chills, nausea, or vomiting noted at this time. 11/19/18-Patient returns at 1 week, per intake RN the amount of seepage into the compression wraps was definitely improved, overall all the wounds are measuring smaller but continuing silver alginate to the wounds as primary dressing 11/26/18 on evaluation today patient appears to be doing quite well in regard to his left lower [place] ulcers. In fact of the areas that were noted initially he only has two regions still open. There is no evidence of active infection at this time. He still is not heard anything from the company regarding lymphedema pumps as of yet. Again as previously seen vascular they have not recommended any surgical intervention. 12/03/2018 on evaluation today patient actually appears to be doing quite well with regard to his lower extremity ulcers. In fact most of the areas appear to be healed the one spot which does not seem to be completely healed I am unsure of whether or not this is really draining that much but nonetheless there does not appear to be any signs of infection or significant drainage at this point. There is no sign of fever, chills, nausea, vomiting, or diarrhea. Overall I am pleased with how things have progressed I think is very close to being able to transition  to his home compression stockings. [name], [name] ([id number]) 12/10/2018 upon evaluation today patient appears to be doing quite well with regard to his left lower extremity. He has been tolerating the dressing changes without complication. Fortunately there is no signs of active infection at this time. He  appears after thorough evaluation of his leg to only have 1 small area that remains open at this point everything else appears to be almost completely closed. He still have significant swelling of the left lower extremity. We had discussed discussing this with his primary care provider he is not able to see her in person they were at the [facility] and right now the [place] is not seeing patients on site. According to the patient anyway. Subsequently he did speak with her apparently and his primary care provider feels that he may likely have a DVT. With that being said she has not seen his leg she is just going off of his history. Nonetheless that is a concern that the patient now has as well and while I do not feel the DVT is likely we can definitely ensure that that is not the case I will go ahead and see about putting that order in today. Nonetheless otherwise I am in a recommend that we continue with the current wound care measures including the compression therapy most likely. We just need to ensure that his leg is indeed free of any DVTs. 12/17/2018 on evaluation today patient actually appears to be completely healed today. He does have 2 very small areas of blistering although this is not anything too significant at this point which is good news. With that being said I am in agreement with the fact that I think he is completely healed at this point. He does want to get back into his compression stocking. The good news is we have gotten approval from insurance for his lymphedema pumps we received a letter since last saw him last week. The other good news is his study did come back and showed no evidence of a DVT. 12/20/2018 on evaluation today patient presents for follow-up concerning his ongoing issues with his left lower extremity. He was actually discharged last Friday and did fairly well until he states blisters opened this morning. He tells me he has been wearing his compression stocking although  he has a hard time getting this on. There does not appear to be any signs of active infection at this time. No fevers, chills, nausea, vomiting, or diarrhea. 12/27/2018 on evaluation today patient appears to be doing very well with regard to his swelling of the left lower extremity the 4 layer compression wrap seems to have been beneficial for him. Fortunately there is no signs of active infection at this time. Patient has been tolerating the compression wrap without complication and his foot swelling in particular appears to be greatly improved. He does still have a wound on the lateral portion of his left leg I believe this is more of a blister that has now reopened. 01/03/2019 on evaluation today patient actually appears to be doing excellent in regard to his left lower extremity. He did receive his compression pumps and is actually use this 7 times since he was last here in the office. On top of the compression wrap he is now roughly 3 cm better at the calf and 2 cm better at the ankle he also states that his foot seem to go an issue better without even having to use a shoe horn. Obviously I  think this is all evidence that he is doing excellent in this regard. The other good news is he does not appear to have anything open today as far as wounds are concerned. 01/15/2019 on evaluation today patient appears to be doing more poorly yet again with regard to his left lower extremity. He has developed new wounds again after being discharged just recently. Unfortunately this continues to be the case that he will heal and then have subsequent new wounds. The last time I was hopeful that he may not end up coming back too quickly especially since he states he has been using his lymphedema pumps along with wearing his compression. Nonetheless he had a blister on the back of his leg that popped up on the left and this has opened up into an ulceration it is quite painful. 01/22/19 on evaluation today patient  actually appears to be doing well with regard to his wound on the left lower extremity. He's been tolerating the dressing changes without complication including the compression wrap in the wound appears to be significantly smaller today which is great news. Overall very pleased in this regard. 01/29/2019 on evaluation today patient appears to be doing well with regard to his left posterior lower extremity ulcer. He has been tolerating the dressing changes without complication. This is not completely healed but is getting much closer. We did order a Farrow wrap 4000 for him he has received this and has it with him today although I am not sure we are quite ready to start him on that as of yet. We are very close. 02/05/2019 on evaluation today patient actually appears to be doing quite well with regard to his left posterior lower extremity ulcer. He still has a very tiny opening remaining but the fortunate thing is he seems to be healing quite nicely. He also did get his Farrow wrap which I am hoping will help with his edema control as well at home. Fortunately there is no evidence of active infection. 02/12/2019 patient and fortunately appears to be doing poorly in regard to his wounds of the left lower extremity. He was very close to healing therefore we attempted to use his Velcro compression wraps continuing with lymphedema pumps at home. Unfortunately that does not seem to have done very well for him. He tells me that he wore them all the time but again I am not sure why if that is the case that he is having such significant edema. He is still on his fluid pills as well. With that being said there is no obvious sign of infection although I do wonder about the possibility of infection at this time as well. 02/19/2019 unfortunately upon evaluation today patient appears to be doing more poorly with regard to his left lower extremity. He is not showing signs of significant improvement and I think the  biggest issue here is that he does have an infection that appears to likely be Pseudomonas. That is based on the blue-green drainage that were noted at this time. Unfortunately the antibiotic that has been on is not going to take care of this at all. I think they will get a need to switch him to either Levaquin or Cipro and this was discussed with the patient. 02/26/2019 on evaluation today patient's lower extremity on the left appears to be doing significantly better as compared to last evaluation. Fortunately there is no signs of active infection at this time. He has been tolerating the compression wrap without complication in  fact he made it the whole week at this point. He is showing signs of excellent improvement I am very happy in this regard. With that being said he is having some issues with infection we did review the results of his culture which I noted today. He did have a positive finding for Enterobacter as well as Alcaligenes faecalis. Fortunately the Levaquin that I placed him on will work for both which is great news. There is no signs of systemic infection at this point. 10/30; left posterior leg wound in the setting of very significant edema and what looks like chronic venous inflammation. He has compression pumps but does not use them. We have been using 3 layer compression. Silver alginate to the wound as the primary dressing 03/18/2019 on evaluation today patient appears to be doing a little better compared to last time I saw him. He really has not been using his compression pumps he tells me that he is having too much discomfort. He has been keeping his wraps on however. He is only been taking his fluid pills every other day because he states they are not really helping and he has an appointment with his primary care provider at the [facility] tomorrow. Subsequently the wound itself on the left lower extremity does seem to be greatly improved compared to previous. 03/25/2019 on evaluation  today patient appears to be doing better with regard to his wounds on the bilateral lower extremities. The left is doing excellent the right is also doing better although both still do show some signs of open wounds noted at this point unfortunately. Fortunately there is no signs of active infection at this time. The patient also is not really having any significant pain which is good news. Unfortunately there was some confusion with the referral on vascular disease and as far as getting the patient scheduled there can be contacting him later today to do this [name], [name] ([id number]) fortunately we got this straightened out. 04/01/2019 on evaluation today patient appears to be doing no fevers, chills, nausea, vomiting, or diarrhea. Excellent at this time with regard to his lower extremities. There does not appear to be any open wound at this point which is good news. Fortunately is also no signs of active infection at this time. Overall feel like the patient has done excellent with the compression the problem is every time we got him to this point and then subsequently go to using his own compression things just go right back to where they were. I am not sure how to address this we can try to get an appointment with vascular for 2 weeks now they have yet to call him. Obviously this has become frustrating for the patient as well. I think the issue has just been an honest error as far as scheduling is concerned but nonetheless still worn out the point where I am unsure of which direction we should take. 04/08/2019 on evaluation today patient actually appears to be doing well with regard to his lower extremities. There are no open wounds at this time and things seem to be managing quite nicely as far as the overall edema control is concerned. With that being said he does have his compression socks today for [place] to go ahead and reinitiate therapy in that manner at this point. He is going to be going for  shoes to be measured on Wednesday and then coincidentally he will also be seeing vascular on Thursday. Overall I think this is good news and  again I am hopeful that they will be able to do something for him to help prevent ongoing issues with edema control as well. No fevers, chills, nausea, vomiting, or diarrhea. 04/11/2019 on evaluation today patient actually appears to be doing poorly after just being discharged on Monday of this week. He had been experiencing issues with again blisters especially on the left lower extremity. With that being said he was completely healed and appeared to be doing great this past Monday. He then subsequently has new blisters that formed before his appointment with vascular this morning. He was also measured for shoes in the interim. With that being said we may have figured out what exactly is going on and why he continues to have issues like what we are seeing at this point. He takes his compression stockings off at nighttime and then he ends up having to sleep in his chair for 5-6 hours a night. He sleeps with his feet down he cannot really get him up in the recliner and therefore he is sleeping and the worst possible his position with his feet on the floor for that majority of the time. Again as I explained to him that is about one third at minimum at least one fourth of his day that he spending with his feet dangling down on the ground and the worst possible position they could be. I think this may be what is causing the issue. Subsequently I am leaning toward thinking that he may need a hospital bed in order to elevate his legs. We likely can have to coordinate this with his primary care provider at the [facility]. Readmission: 01/26/2021 this is a patient who presents for repeat evaluation here in the clinic although it is actually been couple of years since have seen him in fact it was December 2020 when I last saw him. Subsequently he never really healed but  did end up being lost to follow-up. He tells me has been having issues ongoing with his lower extremities has bilateral lower extremity lymphedema no real significant or definitive open wounds but in general his lymphedema is way out of control. We were never able to refer him to lymphedema clinic simply due to the fact to be honest we were never able to get him completely healed. I do not see anyone with open wounds. The patient does have evidence of type 2 diabetes mellitus, lymphedema, chronic venous insufficiency, and hypertension. That really has not changed since his last evaluation. 02/09/2021 upon evaluation today patient appears to be doing a little better in regard to his legs although he still having a tremendous amount of drainage especially on the left leg. Fortunately there does not appear to be any evidence of active infection. Of note when we looked into this further it appears that the patient did not have any absorptive dressing on it was just the 4-layer compression wrap. Nonetheless this is probably big part of the issue here. 10/10; he comes in today with 3 large areas on the upper right lower leg likely remanence of denuded blistering under his compression wraps. He has no other wounds on the right. On the left he has the denuded area on the left medial foot and ankle and on the left dorsal foot. Massive lymphedema in both feet dorsally. Using Zetuvit under compression We have increased home health visitation to twice a week to change the dressings and will change it once 02/22/2021 upon evaluation today patient appears to be doing well currently  with regard to his wounds. He has been tolerating the dressing changes without complication. Fortunately there does not appear to be any evidence of active infection which is great news. No fevers, chills, nausea, vomiting, or diarrhea. The biggest issue I see currently is that home health is not putting any medicine on the actual wounds  before wrapping. 03/01/2021 upon evaluation today the patient's right leg actually appears to be doing quite well which is great news there does not appear to be any evidence of active infection at this time. No fevers, chills, nausea, vomiting, or diarrhea. With that being said the patient is having issues on the left foot where he is having significant drainage is also an ammonia smell he does not have any animals at home and this makes me concerned about a bacteria producing urea as a byproduct. Again the possible common organisms will be E. coli, Proteus, and Enterococcus. All 3 of which can be successfully treated with Levaquin. For that reason I think that this may be a good option for [place] to consider placing him on and I did obtain a culture as well for confirmation sake. Electronic Signature(s) Signed: 03/01/2021 2:15:43 PM By: Worthy Keeler PA-C Entered By: Worthy Keeler on 03/01/2021 14:15:43 [name] ([id number]) -------------------------------------------------------------------------------- Physical Exam Details Patient Name: [name], [name] Date of Service: 03/01/2021 10:15 AM Medical Record Number: [id number] Patient Account Number: [id number] Date of Birth/Sex: 1951/03/03 (70 y.o. M) Treating RN: [name] Primary Care Provider: Felipa Eth Other Clinician: Referring Provider: Felipa Eth Treating Provider/Extender: Skipper Cliche in Treatment: 4 Constitutional Well-nourished and well-hydrated in no acute distress. Respiratory normal breathing without difficulty. Psychiatric this patient is able to make decisions and demonstrates good insight into disease process. Alert and Oriented x 3. pleasant and cooperative. Notes Upon inspection patient's wounds again appear to be pretty much healed as far as the right leg is concerned I am seeing no major issues here which is great news. With that being said he still has weeping and drainage from the left leg  this seems to be more potential for mild cellulitis versus just straight lymphedema drainage. Again with the ammonia type smell I am concerned about the possibility of infection working to see about treating this and obtaining a culture. Electronic Signature(s) Signed: 03/01/2021 2:16:31 PM By: Worthy Keeler PA-C Entered By: Worthy Keeler on 03/01/2021 14:16:31 [name] ([id number]) -------------------------------------------------------------------------------- Physician Orders Details Patient Name: [name] Date of Service: 03/01/2021 10:15 AM Medical Record Number: [id number] Patient Account Number: [id number] Date of Birth/Sex: 1950-12-06 (70 y.o. M) Treating RN: [name] Primary Care Provider: Felipa Eth Other Clinician: Referring Provider: Felipa Eth Treating Provider/Extender: Skipper Cliche in Treatment: 4 Verbal / Phone Orders: No Diagnosis Coding ICD-10 Coding Code Description E11.622 Type 2 diabetes mellitus with other skin ulcer I89.0 Lymphedema, not elsewhere classified I87.2 Venous insufficiency (chronic) (peripheral) L97.822 Non-pressure chronic ulcer of other part of left lower leg with fat layer exposed L97.512 Non-pressure chronic ulcer of other part of right foot with fat layer exposed I10 Essential (primary) hypertension L97.811 Non-pressure chronic ulcer of other part of right lower leg limited to breakdown of skin Follow-up Appointments o Return Appointment in 1 week. [place] for wound care. May utilize formulary equivalent dressing for wound treatment orders unless otherwise specified. Home Health Nurse may visit PRN to address patientos wound care needs. - frequency 3 times per week - patient will be seen once at wound center -  home health to see patient 2 times per week on Wed and friday Bathing/ Shower/ Hygiene o May shower; gently cleanse wound with antibacterial soap, rinse and pat dry prior  to dressing wounds Edema Control - Lymphedema / Segmental Compressive Device / Other Bilateral Lower Extremities o Optional: One layer of unna paste to top of compression wrap (to act as an anchor). o 4 Layer Compression System Lymphedema. - bi lat, apply sorbact to toe area, apply zetuvits/ or formulary for extra absorbant dressing around ankles and foot area, 3 times per week o Elevate, Exercise Daily and Avoid Standing for Long Periods of Time. o Elevate legs to the level of the heart and pump ankles as often as possible o Elevate leg(s) parallel to the floor when sitting. Off-Loading o Open toe surgical shoe Laboratory o Bacteria identified in Wound by Culture (MICRO) - (ICD10 E11.622 - Type 2 diabetes mellitus with other skin ulcer) oooo LOINC Code: 4098-1 oooo Convenience Name: Wound culture routine Patient Medications Allergies: No Known Drug Allergies Notifications Medication Indication Start End Levaquin 03/01/2021 DOSE 1 - oral 500 mg tablet - 1 tablet oral taken 1 time per day for 14 days Electronic Signature(s) Signed: 03/08/2021 3:59:15 PM By: Worthy Keeler PA-C Signed: 03/11/2021 5:07:02 PM By: [name] RN [name], [name] ([id number]) Previous Signature: 03/01/2021 11:32:55 AM Version By: Worthy Keeler PA-C Entered By: [name] on 03/08/2021 10:42:48 [name] ([id number]) -------------------------------------------------------------------------------- Problem List Details Patient Name: [name] Date of Service: 03/01/2021 10:15 AM Medical Record Number: [id number] Patient Account Number: [id number] Date of Birth/Sex: 1951-02-27 (70 y.o. M) Treating RN: [name] Primary Care Provider: Felipa Eth Other Clinician: Referring Provider: Felipa Eth Treating Provider/Extender: Skipper Cliche in Treatment: 4 Active Problems ICD-10 Encounter Code Description Active Date MDM Diagnosis E11.622 Type 2 diabetes mellitus with  other skin ulcer 01/26/2021 No Yes I89.0 Lymphedema, not elsewhere classified 01/26/2021 No Yes I87.2 Venous insufficiency (chronic) (peripheral) 01/26/2021 No Yes L97.822 Non-pressure chronic ulcer of other part of left lower leg with fat layer 01/26/2021 No Yes exposed L97.512 Non-pressure chronic ulcer of other part of right foot with fat layer 01/26/2021 No Yes exposed [place] (primary) hypertension 01/26/2021 No Yes L97.811 Non-pressure chronic ulcer of other part of right lower leg limited to 02/15/2021 No Yes breakdown of skin Inactive Problems Resolved Problems Electronic Signature(s) Signed: 03/01/2021 10:54:05 AM By: Worthy Keeler PA-C Entered By: Worthy Keeler on 03/01/2021 10:54:05 [name] ([id number]) -------------------------------------------------------------------------------- Progress Note Details Patient Name: [name] Date of Service: 03/01/2021 10:15 AM Medical Record Number: [id number] Patient Account Number: [id number] Date of Birth/Sex: 04-01-1951 (70 y.o. M) Treating RN: [name] Primary Care Provider: Felipa Eth Other Clinician: Referring Provider: Felipa Eth Treating Provider/Extender: Skipper Cliche in Treatment: 4 Subjective Chief Complaint Information obtained from Patient Left LE ulcers History of Present Illness (HPI) 70 year old male who presented to the ER with bilateral lower extremity blisters which had started last week. he has a past medical history of leukemia, diabetes mellitus, hypertension, edema of both lower extremities, his recurrent skin infections, peripheral vascular disease, coronary artery disease, congestive heart failure and peripheral neuropathy. in the ER he was given Rocephin and put on Silvadene cream. he was put on oral doxycycline and was asked to follow-up with the [facility]. His last hemoglobin A1c was 6.6 in December and he checks his blood sugar once a week. He does not have any physicians  outside the [place] system. He does not recall any vascular  duplex studies done either for arterial or venous disease but was told to wear compression stockings which he does not use 05/30/2016 -- we have not yet received any of his notes from the [facility] hospital system and his arterial and venous duplex studies are scheduled here in [place] around mid February. We are unable to have his insurance accepted by home health agencies and hence he is getting dressings only once a week. 06/06/16 -- -- I received a call from the patient's PCP at the [facility] at [name] and spoke to Dr. Garvin Fila, phone number [phone] and fax number [phone]. She confirmed that no vascular testing was done over the last 5 years and she would be happy to do them if the patient did want them to be done at the [place] and we could fax him a request. Readmission: 70 year old male seen by as in February of this year and was referred to vein and vascular for studies and opinion from the vascular surgeons. The patient returns today with a fresh problem having had blisters on his left lower extremity which have been there for about 5 days and he clearly states that he has been wearing his compression stockings as advised though he could not read the moderate compression and has been wearing light compression. Review of his electronic medical records note that he had lower extremity arterial duplex examination done on 06/23/2016 which showed no hemodynamically significant stenosis in the bilateral lower extremity arterial system. He also had a lower extremity venous reflux examination done on 07/07/2016 and it was noted that he had venous incompetence in the right great saphenous vein and bilateral common femoral veins. Patient was seen by Dr. Tamala Julian on the same day and for some reason his notes do not reflect the venous studies or the arterial studies and he recommended patient do a venous duplex ultrasound to look for reflux and  return to see him.he would also consider a lymph pump if required. The patient was told that his workup was normal and hence the patient canceled his follow-up appointment. 02/03/17 on evaluation today patient left medial lower extremity blister appears to be doing about the same. It is still continuing to drain and there's still the blistered skin covering the wound bed which is making it difficult for the alternate to do its job. Fortunately there is no evidence of cellulitis. No fevers chills noted. Patient states in general he is not having any significant discomfort. Patient's lower extremity arterial duplex exam revealed that patient was hemodynamically stable with no evidence of stenosis in regard to the bilateral lower extremities. The lower extremity venous reflux exam revealed the patient had venous incontinence noted in the right greater saphenous and bilateral common femoral vein. There is no evidence of deep or superficial vein thrombosis in the bilateral lower extremities. Readmission: 11/12/18 Patient presents for evaluation our clinic today concerning issues that he is having with his left lower extremity. He tells me that a couple weeks ago he began developing blisters on the left lower extremity along with increased swelling. He typically wears his compression stockings on a regular basis is previously been evaluated both here as well is with vascular surgery they would recommend lymphedema pumps but unfortunately that somehow fell through and he never heard anything back from that. Nonetheless I think lymphedema pumps would be beneficial for this patient. He does have a history of hypertension and diabetes. Obviously the chronic venous stasis and lymphedema as well. At this point the blisters have  been given in more trouble he states sometimes when the blisters openings able to clean it down with alcohol and it will dry out and do well. Unfortunately that has not been the case this  time. He is having some discomfort although this mean these with cleaning the areas he doesn't have discomfort just on a regular basis. He has not been able to wear his compression stockings since the blisters arose due to the fact that of course it will drain into the socks causing additional issues and he didn't have any way to wrap this otherwise. He has increased to taking his Lasix every day instead of every other day. He sees his primary care provider later this month as well. No fevers, chills, nausea, or vomiting noted at this time. 11/19/18-Patient returns at 1 week, per intake RN the amount of seepage into the compression wraps was definitely improved, overall all the wounds are measuring smaller but continuing silver alginate to the wounds as primary dressing 11/26/18 on evaluation today patient appears to be doing quite well in regard to his left lower [place] ulcers. In fact of the areas that were noted initially he only has two regions still open. There is no evidence of active infection at this time. He still is not heard anything from the company regarding lymphedema pumps as of yet. Again as previously seen vascular they have not recommended any surgical intervention. [name], [name] ([id number]) 12/03/2018 on evaluation today patient actually appears to be doing quite well with regard to his lower extremity ulcers. In fact most of the areas appear to be healed the one spot which does not seem to be completely healed I am unsure of whether or not this is really draining that much but nonetheless there does not appear to be any signs of infection or significant drainage at this point. There is no sign of fever, chills, nausea, vomiting, or diarrhea. Overall I am pleased with how things have progressed I think is very close to being able to transition to his home compression stockings. 12/10/2018 upon evaluation today patient appears to be doing quite well with regard to his left lower  extremity. He has been tolerating the dressing changes without complication. Fortunately there is no signs of active infection at this time. He appears after thorough evaluation of his leg to only have 1 small area that remains open at this point everything else appears to be almost completely closed. He still have significant swelling of the left lower extremity. We had discussed discussing this with his primary care provider he is not able to see her in person they were at the [facility] and right now the [place] is not seeing patients on site. According to the patient anyway. Subsequently he did speak with her apparently and his primary care provider feels that he may likely have a DVT. With that being said she has not seen his leg she is just going off of his history. Nonetheless that is a concern that the patient now has as well and while I do not feel the DVT is likely we can definitely ensure that that is not the case I will go ahead and see about putting that order in today. Nonetheless otherwise I am in a recommend that we continue with the current wound care measures including the compression therapy most likely. We just need to ensure that his leg is indeed free of any DVTs. 12/17/2018 on evaluation today patient actually appears to be completely healed today. He  does have 2 very small areas of blistering although this is not anything too significant at this point which is good news. With that being said I am in agreement with the fact that I think he is completely healed at this point. He does want to get back into his compression stocking. The good news is we have gotten approval from insurance for his lymphedema pumps we received a letter since last saw him last week. The other good news is his study did come back and showed no evidence of a DVT. 12/20/2018 on evaluation today patient presents for follow-up concerning his ongoing issues with his left lower extremity. He was actually discharged  last Friday and did fairly well until he states blisters opened this morning. He tells me he has been wearing his compression stocking although he has a hard time getting this on. There does not appear to be any signs of active infection at this time. No fevers, chills, nausea, vomiting, or diarrhea. 12/27/2018 on evaluation today patient appears to be doing very well with regard to his swelling of the left lower extremity the 4 layer compression wrap seems to have been beneficial for him. Fortunately there is no signs of active infection at this time. Patient has been tolerating the compression wrap without complication and his foot swelling in particular appears to be greatly improved. He does still have a wound on the lateral portion of his left leg I believe this is more of a blister that has now reopened. 01/03/2019 on evaluation today patient actually appears to be doing excellent in regard to his left lower extremity. He did receive his compression pumps and is actually use this 7 times since he was last here in the office. On top of the compression wrap he is now roughly 3 cm better at the calf and 2 cm better at the ankle he also states that his foot seem to go an issue better without even having to use a shoe horn. Obviously I think this is all evidence that he is doing excellent in this regard. The other good news is he does not appear to have anything open today as far as wounds are concerned. 01/15/2019 on evaluation today patient appears to be doing more poorly yet again with regard to his left lower extremity. He has developed new wounds again after being discharged just recently. Unfortunately this continues to be the case that he will heal and then have subsequent new wounds. The last time I was hopeful that he may not end up coming back too quickly especially since he states he has been using his lymphedema pumps along with wearing his compression. Nonetheless he had a blister on the  back of his leg that popped up on the left and this has opened up into an ulceration it is quite painful. 01/22/19 on evaluation today patient actually appears to be doing well with regard to his wound on the left lower extremity. He's been tolerating the dressing changes without complication including the compression wrap in the wound appears to be significantly smaller today which is great news. Overall very pleased in this regard. 01/29/2019 on evaluation today patient appears to be doing well with regard to his left posterior lower extremity ulcer. He has been tolerating the dressing changes without complication. This is not completely healed but is getting much closer. We did order a [place] wrap 4000 for him he has received this and has it with him today although I am not sure  we are quite ready to start him on that as of yet. We are very close. 02/05/2019 on evaluation today patient actually appears to be doing quite well with regard to his left posterior lower extremity ulcer. He still has a very tiny opening remaining but the fortunate thing is he seems to be healing quite nicely. He also did get his F[place] wrap which I am hoping will help with his edema control as well at home. Fortunately there is no evidence of active infection. 02/12/2019 patient and fortunately appears to be doing poorly in regard to his wounds of the left lower extremity. He was very close to healing therefore we attempted to use his Velcro compression wraps continuing with lymphedema pumps at home. Unfortunately that does not seem to have done very well for him. He tells me that he wore them all the time but again I am not sure why if that is the case that he is having such significant edema. He is still on his fluid pills as well. With that being said there is no obvious sign of infection although I do wonder about the possibility of infection at this time as well. 02/19/2019 unfortunately upon evaluation today patient  appears to be doing more poorly with regard to his left lower extremity. He is not showing signs of significant improvement and I think the biggest issue here is that he does have an infection that appears to likely be Pseudomonas. That is based on the blue-green drainage that were noted at this time. Unfortunately the antibiotic that has been on is not going to take care of this at all. I think they will get a need to switch him to either Levaquin or Cipro and this was discussed with the patient. 02/26/2019 on evaluation today patient's lower extremity on the left appears to be doing significantly better as compared to last evaluation. Fortunately there is no signs of active infection at this time. He has been tolerating the compression wrap without complication in fact he made it the whole week at this point. He is showing signs of excellent improvement I am very happy in this regard. With that being said he is having some issues with infection we did review the results of his culture which I noted today. He did have a positive finding for Enterobacter as well as Alcaligenes faecalis. Fortunately the Levaquin that I placed him on will work for both which is great news. There is no signs of systemic infection at this point. 10/30; left posterior leg wound in the setting of very significant edema and what looks like chronic venous inflammation. He has compression pumps but does not use them. We have been using 3 layer compression. Silver alginate to the wound as the primary dressing 03/18/2019 on evaluation today patient appears to be doing a little better compared to last time I saw him. He really has not been using his compression pumps he tells me that he is having too much discomfort. He has been keeping his wraps on however. He is only been taking his fluid pills every other day because he states they are not really helping and he has an appointment with his primary care provider at the  [facility] tomorrow. Subsequently the wound itself on the left lower extremity does seem to be greatly improved compared to previous. [name], [name] ([id number]) 03/25/2019 on evaluation today patient appears to be doing better with regard to his wounds on the bilateral lower extremities. The left is doing excellent the  right is also doing better although both still do show some signs of open wounds noted at this point unfortunately. Fortunately there is no signs of active infection at this time. The patient also is not really having any significant pain which is good news. Unfortunately there was some confusion with the referral on vascular disease and as far as getting the patient scheduled there can be contacting him later today to do this fortunately we got this straightened out. 04/01/2019 on evaluation today patient appears to be doing no fevers, chills, nausea, vomiting, or diarrhea. Excellent at this time with regard to his lower extremities. There does not appear to be any open wound at this point which is good news. Fortunately is also no signs of active infection at this time. Overall feel like the patient has done excellent with the compression the problem is every time we got him to this point and then subsequently go to using his own compression things just go right back to where they were. I am not sure how to address this we can try to get an appointment with vascular for 2 weeks now they have yet to call him. Obviously this has become frustrating for the patient as well. I think the issue has just been an honest error as far as scheduling is concerned but nonetheless still worn out the point where I am unsure of which direction we should take. 04/08/2019 on evaluation today patient actually appears to be doing well with regard to his lower extremities. There are no open wounds at this time and things seem to be managing quite nicely as far as the overall edema control is concerned. With that  being said he does have his compression socks today for [place] to go ahead and reinitiate therapy in that manner at this point. He is going to be going for shoes to be measured on Wednesday and then coincidentally he will also be seeing vascular on Thursday. Overall I think this is good news and again I am hopeful that they will be able to do something for him to help prevent ongoing issues with edema control as well. No fevers, chills, nausea, vomiting, or diarrhea. 04/11/2019 on evaluation today patient actually appears to be doing poorly after just being discharged on Monday of this week. He had been experiencing issues with again blisters especially on the left lower extremity. With that being said he was completely healed and appeared to be doing great this past Monday. He then subsequently has new blisters that formed before his appointment with vascular this morning. He was also measured for shoes in the interim. With that being said we may have figured out what exactly is going on and why he continues to have issues like what we are seeing at this point. He takes his compression stockings off at nighttime and then he ends up having to sleep in his chair for 5-6 hours a night. He sleeps with his feet down he cannot really get him up in the recliner and therefore he is sleeping and the worst possible his position with his feet on the floor for that majority of the time. Again as I explained to him that is about one third at minimum at least one fourth of his day that he spending with his feet dangling down on the ground and the worst possible position they could be. I think this may be what is causing the issue. Subsequently I am leaning toward thinking that he may need a  hospital bed in order to elevate his legs. We likely can have to coordinate this with his primary care provider at the [facility]. Readmission: 01/26/2021 this is a patient who presents for repeat evaluation here in the clinic  although it is actually been couple of years since have seen him in fact it was December 2020 when I last saw him. Subsequently he never really healed but did end up being lost to follow-up. He tells me has been having issues ongoing with his lower extremities has bilateral lower extremity lymphedema no real significant or definitive open wounds but in general his lymphedema is way out of control. We were never able to refer him to lymphedema clinic simply due to the fact to be honest we were never able to get him completely healed. I do not see anyone with open wounds. The patient does have evidence of type 2 diabetes mellitus, lymphedema, chronic venous insufficiency, and hypertension. That really has not changed since his last evaluation. 02/09/2021 upon evaluation today patient appears to be doing a little better in regard to his legs although he still having a tremendous amount of drainage especially on the left leg. Fortunately there does not appear to be any evidence of active infection. Of note when we looked into this further it appears that the patient did not have any absorptive dressing on it was just the 4-layer compression wrap. Nonetheless this is probably big part of the issue here. 10/10; he comes in today with 3 large areas on the upper right lower leg likely remanence of denuded blistering under his compression wraps. He has no other wounds on the right. On the left he has the denuded area on the left medial foot and ankle and on the left dorsal foot. Massive lymphedema in both feet dorsally. Using Zetuvit under compression We have increased home health visitation to twice a week to change the dressings and will change it once 02/22/2021 upon evaluation today patient appears to be doing well currently with regard to his wounds. He has been tolerating the dressing changes without complication. Fortunately there does not appear to be any evidence of active infection which is great  news. No fevers, chills, nausea, vomiting, or diarrhea. The biggest issue I see currently is that home health is not putting any medicine on the actual wounds before wrapping. 03/01/2021 upon evaluation today the patient's right leg actually appears to be doing quite well which is great news there does not appear to be any evidence of active infection at this time. No fevers, chills, nausea, vomiting, or diarrhea. With that being said the patient is having issues on the left foot where he is having significant drainage is also an ammonia smell he does not have any animals at home and this makes me concerned about a bacteria producing urea as a byproduct. Again the possible common organisms will be E. coli, Proteus, and Enterococcus. All 3 of which can be successfully treated with Levaquin. For that reason I think that this may be a good option for [place] to consider placing him on and I did obtain a culture as well for confirmation sake. Objective Constitutional Well-nourished and well-hydrated in no acute distress. Winfield, [name] ([id number]) Vitals Time Taken: 10:39 AM, Height: 73 in, Weight: 312 lbs, BMI: 41.2, Temperature: 98.4 F, Pulse: 82 bpm, Respiratory Rate: 18 breaths/min, Blood Pressure: 164/78 mmHg. Respiratory normal breathing without difficulty. Psychiatric this patient is able to make decisions and demonstrates good insight into disease process. Alert and  Oriented x 3. pleasant and cooperative. General Notes: Upon inspection patient's wounds again appear to be pretty much healed as far as the right leg is concerned I am seeing no major issues here which is great news. With that being said he still has weeping and drainage from the left leg this seems to be more potential for mild cellulitis versus just straight lymphedema drainage. Again with the ammonia type smell I am concerned about the possibility of infection working to see about treating this and obtaining a  culture. Integumentary (Hair, Skin) Wound #12 status is Open. Original cause of wound was Gradually Appeared. The date acquired was: 02/12/2021. The wound has been in treatment 2 weeks. The wound is located on the Right,Anterior Lower Leg. The wound measures 0cm length x 0cm width x 0cm depth; 0cm^2 area and 0cm^3 volume. There is no tunneling or undermining noted. There is a none present amount of drainage noted. There is no granulation within the wound bed. There is no necrotic tissue within the wound bed. Wound #13 status is Open. Original cause of wound was Gradually Appeared. The date acquired was: 02/12/2021. The wound has been in treatment 2 weeks. The wound is located on the Right,Medial Lower Leg. The wound measures 0cm length x 0cm width x 0cm depth; 0cm^2 area and 0cm^3 volume. There is no tunneling or undermining noted. There is a none present amount of drainage noted. There is no granulation within the wound bed. There is no necrotic tissue within the wound bed. Wound #14 status is Open. Original cause of wound was Gradually Appeared. The date acquired was: 02/12/2021. The wound has been in treatment 2 weeks. The wound is located on the Right,Posterior Lower Leg. The wound measures 0cm length x 0cm width x 0cm depth; 0cm^2 area and 0cm^3 volume. There is no tunneling or undermining noted. There is a none present amount of drainage noted. There is no granulation within the wound bed. There is no necrotic tissue within the wound bed. 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 Post procedure Diagnosis Wound #: Same as Pre-Procedure Plan Follow-up Appointments: Return Appointment in 1 week. Home Health: [facility] for wound care. May utilize formulary equivalent dressing for wound treatment orders unless otherwise specified. Home Health Nurse may visit PRN to address patient s wound care needs. - frequency 3 times per week - patient will be seen once at wound center - home health to see patient 2 times per week on Wed and friday Bathing/ Shower/ Hygiene: May shower; gently cleanse wound with antibacterial soap, rinse and pat dry prior to dressing wounds Edema Control - Lymphedema / Segmental Compressive Device / OtherALI, [name] ([id number]) Optional: One layer of unna paste to top of compression wrap (to act as an anchor). 4 Layer Compression System Lymphedema. - bi lat, apply sorbact to toe area, apply zetuvits/ or formulary for extra absorbant dressing around ankles and foot area, 3 times per week Elevate, Exercise Daily and Avoid Standing for Long Periods of Time. Elevate legs to the level of the heart and pump ankles  as often as possible Elevate leg(s) parallel to the floor when sitting. Off-Loading: Open toe surgical shoe The following medication(s) was prescribed: Levaquin oral 500 mg tablet 1 1 tablet oral taken 1 time per day for 14 days starting 03/01/2021 1. Would recommend currently that we going to continue with wound care measures as before and the patient is in agreement with that plan this includes the use of the compression wrapping we have been doing a 4-layer compression wrap currently. 2. I am also can recommend that we have the patient go ahead and start taking the Levaquin which I think is good to be a good option for him as far as treating the potential infectious agents that could be causing the odor that we are noticing at this point. He  is in agreement with that plan. 3. I am also can recommend he should continue to elevate his legs as much as possible to try to help with edema control. We will see patient back for reevaluation in 1 week here in the clinic. If anything worsens or changes patient will contact our office for additional recommendations. Electronic Signature(s) Signed: 03/01/2021 2:17:11 PM By: Worthy Keeler PA-C Entered By: Worthy Keeler on 03/01/2021 14:17:11 [name] ([id number]) -------------------------------------------------------------------------------- SuperBill Details Patient Name: [name] Date of Service: 03/01/2021 Medical Record Number: [id number] Patient Account Number: [id number] Date of Birth/Sex: Oct 27, 1950 (70 y.o. M) Treating RN: [name] Primary Care Provider: Felipa Eth Other Clinician: Referring Provider: Felipa Eth Treating Provider/Extender: Skipper Cliche in Treatment: 4 Diagnosis Coding ICD-10 Codes Code Description [phone] Type 2 diabetes mellitus with other skin ulcer I89.0 Lymphedema, not elsewhere classified I87.2 Venous insufficiency (chronic) (peripheral) L97.822 Non-pressure chronic ulcer of other part of left lower leg with fat layer exposed L97.512 Non-pressure chronic ulcer of other part of right foot with fat layer exposed I10 Essential (primary) hypertension L97.811 Non-pressure chronic ulcer of other part of right lower leg limited to breakdown of skin Facility Procedures CPT4: Description Modifier Quantity Code [id number] BILATERAL: Application of multi-layer venous compression system; leg (below knee), including 1 ankle and foot. Physician Procedures CPT4 Code: [id number] Description: 80998 - WC PHYS LEVEL 4 - EST PT Modifier: Quantity: 1 CPT4 Code: Description: ICD-10 Diagnosis Description E11.622 Type 2 diabetes mellitus with other skin ulcer I89.0 Lymphedema, not elsewhere classified I87.2 Venous insufficiency (chronic)  (peripheral) L97.822 Non-pressure chronic ulcer of other part of left lower  leg with fat layer Modifier: exposed Quantity: Electronic Signature(s) Signed: 03/01/2021 3:01:43 PM By: [name] RN Signed: 03/01/2021 4:31:19 PM By: Worthy Keeler PA-C Previous Signature: 03/01/2021 2:17:27 PM Version By: Worthy Keeler PA-C Entered By: [name] on 03/01/2021 15:01:43

## 2021-03-02 NOTE — Progress Notes (Signed)
TIRRELL, BUCHBERGER ([id number]) Visit Report for 03/01/2021 Arrival Information Details Patient Name: [name], [name] Date of Service: 03/01/2021 10:15 AM Medical Record Number: [id number] Patient Account Number: [id number] Date of Birth/Sex: [birth year] (70 y.o. M) Treating RN: [name] Primary Care Ahana Najera: Felipa Eth Other Clinician: Referring Latroya Ng: Felipa Eth Treating Ebon Ketchum/Extender: Skipper Cliche in Treatment: 4 Visit Information History Since Last Visit All ordered tests and consults were completed: No Patient Arrived: Ambulatory Added or deleted any medications: No Arrival Time: 10:30 Any new allergies or adverse reactions: No Accompanied By: self Had a fall or experienced change in No Transfer Assistance: None activities of daily living that may affect Patient Identification Verified: Yes risk of falls: Secondary Verification Process Completed: Yes Signs or symptoms of abuse/neglect since last visito No Patient Requires Transmission-Based Precautions: No Hospitalized since last visit: No Patient Has Alerts: No Implantable device outside of the clinic excluding No cellular tissue based products placed in the center since last visit: Has Dressing in Place as Prescribed: Yes Has Compression in Place as Prescribed: Yes Pain Present Now: No Electronic Signature(s) Signed: 03/02/2021 4:18:25 PM By: [name] RN Entered By: [name] on 03/01/2021 10:39:39 [name] ([id number]) -------------------------------------------------------------------------------- Clinic Level of Care Assessment Details Patient Name: [name] Date of Service: 03/01/2021 10:15 AM Medical Record Number: [id number] Patient Account Number: [id number] Date of Birth/Sex: 1950/12/02 (70 y.o. M) Treating RN: [name] Primary Care Aengus Sauceda: Felipa Eth Other Clinician: Referring Esmeralda Malay: Felipa Eth Treating Careem Yasui/Extender: Skipper Cliche in  Treatment: 4 Clinic Level of Care Assessment Items TOOL 1 Quantity Score []  - Use when EandM and Procedure is performed on INITIAL visit 0 ASSESSMENTS - Nursing Assessment / Reassessment []  - General Physical Exam (combine w/ comprehensive assessment (listed just below) when performed on new 0 pt. evals) []  - 0 Comprehensive Assessment (HX, ROS, Risk Assessments, Wounds Hx, etc.) ASSESSMENTS - Wound and Skin Assessment / Reassessment []  - Dermatologic / Skin Assessment (not related to wound area) 0 ASSESSMENTS - Ostomy and/or Continence Assessment and Care []  - Incontinence Assessment and Management 0 []  - 0 Ostomy Care Assessment and Management (repouching, etc.) PROCESS - Coordination of Care []  - Simple Patient / Family Education for ongoing care 0 []  - 0 Complex (extensive) Patient / Family Education for ongoing care []  - 0 Staff obtains Programmer, systems, Records, Test Results / Process Orders []  - 0 Staff telephones HHA, Nursing Homes / Clarify orders / etc []  - 0 Routine Transfer to another Facility (non-emergent condition) []  - 0 Routine Hospital Admission (non-emergent condition) []  - 0 New Admissions / Biomedical engineer / Ordering NPWT, Apligraf, etc. []  - 0 Emergency Hospital Admission (emergent condition) PROCESS - Special Needs []  - Pediatric / Minor Patient Management 0 []  - 0 Isolation Patient Management []  - 0 Hearing / Language / Visual special needs []  - 0 Assessment of Community assistance (transportation, D/C planning, etc.) []  - 0 Additional assistance / Altered mentation []  - 0 Support Surface(s) Assessment (bed, cushion, seat, etc.) INTERVENTIONS - Miscellaneous []  - External ear exam 0 []  - 0 Patient Transfer (multiple staff / Civil Service fast streamer / Similar devices) []  - 0 Simple Staple / Suture removal (25 or less) []  - 0 Complex Staple / Suture removal (26 or more) []  - 0 Hypo/Hyperglycemic Management (do not check if billed separately) []  -  0 Ankle / Brachial Index (ABI) - do not check if billed separately Has the patient been seen at the hospital within the last three years: Yes Total  Score: 0 Level Of Care: ____ [name] ([id number]) Electronic Signature(s) Signed: 03/02/2021 4:18:25 PM By: [name] RN Entered By: [name] on 03/01/2021 15:01:31 [name] ([id number]) -------------------------------------------------------------------------------- Compression Therapy Details Patient Name: [name] Date of Service: 03/01/2021 10:15 AM Medical Record Number: [id number] Patient Account Number: [id number] Date of Birth/Sex: [birth year] (70 y.o. M) Treating RN: [name] Primary Care Mathieu Schloemer: Felipa Eth Other Clinician: Referring Dacian Orrico: Felipa Eth Treating Neville Pauls/Extender: Skipper Cliche in Treatment: 4 Compression Therapy Performed for Wound Assessment: NonWound Condition Lymphedema - Left Leg Performed By: Clinician [name], RN Compression Type: Four Layer Post Procedure Diagnosis Same as Pre-procedure Electronic Signature(s) Signed: 03/02/2021 4:18:25 PM By: [name] RN Entered By: [name] on 03/01/2021 11:04:00 [name] ([id number]) -------------------------------------------------------------------------------- Compression Therapy Details Patient Name: [name] Date of Service: 03/01/2021 10:15 AM Medical Record Number: [id number] Patient Account Number: [id number] Date of Birth/Sex: 1951-03-09 (70 y.o. M) Treating RN: [name] Primary Care Hanalei Glace: Felipa Eth Other Clinician: Referring Deshawn Skelley: Felipa Eth Treating Mahika Vanvoorhis/Extender: Skipper Cliche in Treatment: 4 Compression Therapy Performed for Wound Assessment: NonWound Condition Lymphedema - Right Leg Performed By: Clinician [name], RN Compression Type: Four Layer Post Procedure Diagnosis Same as Pre-procedure Electronic Signature(s) Signed: 03/01/2021 3:01:14  PM By: [name] RN Entered By: [name] on 03/01/2021 15:01:14 [name] ([id number]) -------------------------------------------------------------------------------- Encounter Discharge Information Details Patient Name: [name] Date of Service: 03/01/2021 10:15 AM Medical Record Number: [id number] Patient Account Number: [id number] Date of Birth/Sex: 28-Dec-1950 (70 y.o. M) Treating RN: [name] Primary Care Ihan Pat: Felipa Eth Other Clinician: Referring Zylon Creamer: Felipa Eth Treating Skylur Fuston/Extender: Skipper Cliche in Treatment: 4 Encounter Discharge Information Items Discharge Condition: Stable Ambulatory Status: Ambulatory Discharge Destination: Home Transportation: Private Auto Accompanied By: self Schedule Follow-up Appointment: Yes Clinical Summary of Care: Patient Declined Electronic Signature(s) Signed: 03/01/2021 3:02:52 PM By: [name] RN Entered By: [name] on 03/01/2021 15:02:51 [name] ([id number]) -------------------------------------------------------------------------------- Lower Extremity Assessment Details Patient Name: [name] Date of Service: 03/01/2021 10:15 AM Medical Record Number: [id number] Patient Account Number: [id number] Date of Birth/Sex: May 08, 1951 (70 y.o. M) Treating RN: [name] Primary Care Mattix Imhof: Felipa Eth Other Clinician: Referring Christopherjame Carnell: Felipa Eth Treating Mathhew Buysse/Extender: Jeri Cos Weeks in Treatment: 4 Edema Assessment Assessed: [Left: No] [Right: No] [Left: Edema] [Right: :] Calf Left: Right: Point of Measurement: 35 cm From Medial Instep 42 cm 41.6 cm Ankle Left: Right: Point of Measurement: 10 cm From Medial Instep 37.8 cm 26.7 cm Electronic Signature(s) Signed: 03/02/2021 4:18:25 PM By: [name] RN Entered By: [name] on 03/01/2021 10:53:35 [name]  ([id number]) -------------------------------------------------------------------------------- Multi Wound Chart Details Patient Name: [name] Date of Service: 03/01/2021 10:15 AM Medical Record Number: [id number] Patient Account Number: [id number] Date of Birth/Sex: 16-May-1950 (70 y.o. 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 Extremity Extremity Comorbid History: Hypertension, Peripheral Venous Hypertension, Peripheral Venous Hypertension, Peripheral Venous Disease, Type II Diabetes, Disease, Type II Diabetes, Disease, Type II Diabetes, Neuropathy, Received Neuropathy, Received Neuropathy, Received Chemotherapy Chemotherapy Chemotherapy Date Acquired: 02/12/2021 02/12/2021 02/12/2021  Weeks of Treatment: 2 2 2  Wound Status: Open Open Open Measurements L x W x D (cm) 0x0x0 0x0x0 0x0x0 Area (cm) : 0 0 0 Volume (cm) : 0 0 0 % Reduction in Area: 100.00% 100.00% 100.00% % Reduction in Volume: 100.00% 100.00% 100.00% Classification: Grade 2 Grade 2 Grade 2 Exudate Amount: None Present None Present None Present Granulation Amount: None Present (0%) None Present (0%) None Present (0%) Necrotic Amount: None Present (0%) None Present (0%) None Present (0%) Exposed Structures: Fascia: No Fascia: No Fascia: No Fat Layer (Subcutaneous Tissue): Fat Layer (Subcutaneous Tissue):  Fat Layer (Subcutaneous Tissue): No No No Tendon: No Tendon: No Tendon: No Muscle: No Muscle: No Muscle: No Joint: No Joint: No Joint: No Bone: No Bone: No Bone: No Epithelialization: Large (67-100%) None None Treatment Notes Electronic Signature(s) Signed: 03/02/2021 4:18:25 PM By: [name] RN Entered By: [name] on 03/01/2021 10:57:50 [name] ([id number]) -------------------------------------------------------------------------------- Multi-Disciplinary Care Plan Details Patient Name: [name] Date of Service: 03/01/2021 10:15 AM Medical Record Number: [id number] Patient Account Number: [id number] Date of Birth/Sex: 1951/03/16 (70 y.o. M) Treating RN: [name] Primary Care Albena Comes: Felipa Eth Other Clinician: Referring Tregan Read: Felipa Eth Treating Perrin Eddleman/Extender: Skipper Cliche in Treatment: 4 Active Inactive Wound/Skin Impairment Nursing Diagnoses: Knowledge deficit related to ulceration/compromised skin integrity Goals: Patient/caregiver will verbalize understanding of skin care regimen Date Initiated: 01/26/2021 Target Resolution Date: 02/25/2021 Goal Status: Active Interventions: Assess patient/caregiver ability to obtain necessary supplies Assess patient/caregiver ability to perform ulcer/skin care regimen upon admission and as needed Assess ulceration(s) every visit Notes: Electronic Signature(s) Signed: 03/02/2021 4:18:25 PM By: [name] RN Entered By: [name] on 03/01/2021 10:57:13 [name] ([id number]) -------------------------------------------------------------------------------- Pain Assessment Details Patient Name: [name] Date of Service: 03/01/2021 10:15 AM Medical Record Number: [id number] Patient Account Number: [id number] Date of Birth/Sex: 12-30-1950 (70 y.o. M) Treating RN: [name] Primary Care Aurilla Coulibaly: Felipa Eth Other Clinician: Referring Thelda Gagan: Felipa Eth Treating Margaree Sandhu/Extender: Skipper Cliche in Treatment: 4 Active Problems Location of Pain Severity and Description of Pain Patient Has Paino No Site Locations Pain Management and Medication Current Pain Management: Electronic Signature(s) Signed: 03/02/2021 4:18:25 PM By: [name] RN Entered By: [name] on 03/01/2021 10:40:11 [name] ([id number]) -------------------------------------------------------------------------------- Patient/Caregiver Education Details Patient Name: [name] Date of Service: 03/01/2021 10:15 AM Medical Record Number: [id number] Patient Account Number: [id number] Date of Birth/Gender: 29-Aug-1950 (70 y.o. M) Treating RN: [name] Primary Care Physician: Felipa Eth Other Clinician: Referring Physician: Felipa Eth Treating Physician/Extender: Skipper Cliche in Treatment: 4 Education Assessment Education Provided To: Patient Education Topics Provided Wound/Skin Impairment: Methods: Explain/Verbal Responses: State content correctly Electronic Signature(s) Signed: 03/02/2021 4:18:25 PM By: [name] RN Entered By: [name] on 03/01/2021 15:01:53 [name] ([id number]) -------------------------------------------------------------------------------- Wound Assessment Details Patient Name: [name] Date of Service: 03/01/2021 10:15 AM Medical Record Number: [id number] Patient Account Number: [id number] Date of Birth/Sex: [birth year] (70 y.o. M) Treating RN: [name] Primary Care Evalie Hargraves: Felipa Eth Other Clinician: Referring Reynold Mantell: Felipa Eth Treating Elvera Almario/Extender: Skipper Cliche in Treatment: 4 Wound Status Wound Number: 12 Primary Diabetic Wound/Ulcer of the Lower Extremity Etiology: Wound Location: Right, Anterior Lower Leg Wound Open Wounding Event: Gradually Appeared Status: Date Acquired: 02/12/2021 Comorbid Hypertension, Peripheral Venous Disease, Type  II Weeks Of Treatment: 2 History: Diabetes, Neuropathy, Received Chemotherapy Clustered Wound: No Photos Wound Measurements Length: (cm) 0 Width: (cm) 0 Depth: (cm) 0 Area: (cm) 0 Volume: (cm) 0 % Reduction in Area: 100% %  Reduction in Volume: 100% Epithelialization: Large (67-100%) Tunneling: No Undermining: No Wound Description Classification: Grade 2 Exudate Amount: None Present Foul Odor After Cleansing: No Slough/Fibrino No Wound Bed Granulation Amount: None Present (0%) Exposed Structure Necrotic Amount: None Present (0%) Fascia Exposed: No Fat Layer (Subcutaneous Tissue) Exposed: No Tendon Exposed: No Muscle Exposed: No Joint Exposed: No Bone Exposed: No Electronic Signature(s) Signed: 03/02/2021 4:18:25 PM By: [name] RN Entered By: [name] on 03/01/2021 10:52:04 [name] ([id number]) -------------------------------------------------------------------------------- Wound Assessment Details Patient Name: [name] Date of Service: 03/01/2021 10:15 AM Medical Record Number: [id number] Patient Account Number: [id number] Date of Birth/Sex: 1950/11/25 (70 y.o. M) Treating RN: [name] Primary Care Shaneeka Scarboro: Felipa Eth Other Clinician: Referring Jacqeline Broers: Felipa Eth Treating Defne Gerling/Extender: Skipper Cliche in Treatment: 4 Wound Status Wound Number: 13 Primary Diabetic Wound/Ulcer of the Lower Extremity Etiology: Wound Location: Right, Medial Lower Leg Wound Open Wounding Event: Gradually Appeared Status: Date Acquired: 02/12/2021 Comorbid Hypertension, Peripheral Venous Disease, Type II Weeks Of Treatment: 2 History: Diabetes, Neuropathy, Received Chemotherapy Clustered Wound: No Photos Wound Measurements Length: (cm) 0 Width: (cm) 0 Depth: (cm) 0 Area: (cm) 0 Volume: (cm) 0 % Reduction in Area: 100% % Reduction in Volume: 100% Epithelialization: None Tunneling: No Undermining: No Wound  Description Classification: Grade 2 Exudate Amount: None Present Foul Odor After Cleansing: No Slough/Fibrino No Wound Bed Granulation Amount: None Present (0%) Exposed Structure Necrotic Amount: None Present (0%) Fascia Exposed: No Fat Layer (Subcutaneous Tissue) Exposed: No Tendon Exposed: No Muscle Exposed: No Joint Exposed: No Bone Exposed: No Electronic Signature(s) Signed: 03/02/2021 4:18:25 PM By: [name] RN Entered By: [name] on 03/01/2021 10:52:29 [name] ([id number]) -------------------------------------------------------------------------------- Wound Assessment Details Patient Name: [name] Date of Service: 03/01/2021 10:15 AM Medical Record Number: [id number] Patient Account Number: [id number] Date of Birth/Sex: [birth year] (70 y.o. M) Treating RN: [name] Primary Care Yahye Siebert: Felipa Eth Other Clinician: Referring Lakara Weiland: Felipa Eth Treating Kareen Jefferys/Extender: Skipper Cliche in Treatment: 4 Wound Status Wound Number: 14 Primary Diabetic Wound/Ulcer of the Lower Extremity Etiology: Wound Location: Right, Posterior Lower Leg Wound Open Wounding Event: Gradually Appeared Status: Date Acquired: 02/12/2021 Comorbid Hypertension, Peripheral Venous Disease, Type II Weeks Of Treatment: 2 History: Diabetes, Neuropathy, Received Chemotherapy Clustered Wound: No Photos Wound Measurements Length: (cm) 0 Width: (cm) 0 Depth: (cm) 0 Area: (cm) 0 Volume: (cm) 0 % Reduction in Area: 100% % Reduction in Volume: 100% Epithelialization: None Tunneling: No Undermining: No Wound Description Classification: Grade 2 Exudate Amount: None Present Foul Odor After Cleansing: No Slough/Fibrino No Wound Bed Granulation Amount: None Present (0%) Exposed Structure Necrotic Amount: None Present (0%) Fascia Exposed: No Fat Layer (Subcutaneous Tissue) Exposed: No Tendon Exposed: No Muscle Exposed: No Joint Exposed: No Bone  Exposed: No Electronic Signature(s) Signed: 03/02/2021 4:18:25 PM By: [name] RN Entered By: [name] on 03/01/2021 10:52:57 [name] ([id number]) -------------------------------------------------------------------------------- Vitals Details Patient Name: [name] Date of Service: 03/01/2021 10:15 AM Medical Record Number: [id number] Patient Account Number: [id number] Date of Birth/Sex: 15-Sep-1950 (70 y.o. M) Treating RN: [name] Primary Care Socorro Kanitz: Felipa Eth Other Clinician: Referring Carlus Stay: Felipa Eth Treating Srishti Strnad/Extender: Skipper Cliche in Treatment: 4 Vital Signs Time Taken: 10:39 Temperature (F): 98.4 Height (in): 73 Pulse (bpm): 82 Weight (lbs): 312 Respiratory Rate (breaths/min): 18 Body Mass Index (BMI): 41.2 Blood Pressure (mmHg): 164/78 Reference Range: 80 - 120 mg / dl Electronic Signature(s) Signed: 03/02/2021 4:18:25 PM By: [name] RN Entered By: [name] on 03/01/2021 10:40:02

## 2021-03-03 LAB — AEROBIC CULTURE W GRAM STAIN (SUPERFICIAL SPECIMEN)

## 2021-03-08 ENCOUNTER — Encounter: Payer: PPO | Admitting: Physician Assistant

## 2021-03-08 ENCOUNTER — Other Ambulatory Visit: Payer: Self-pay

## 2021-03-08 DIAGNOSIS — L97812 Non-pressure chronic ulcer of other part of right lower leg with fat layer exposed: Secondary | ICD-10-CM | POA: Diagnosis not present

## 2021-03-08 DIAGNOSIS — I890 Lymphedema, not elsewhere classified: Secondary | ICD-10-CM | POA: Diagnosis not present

## 2021-03-08 DIAGNOSIS — E11622 Type 2 diabetes mellitus with other skin ulcer: Secondary | ICD-10-CM | POA: Diagnosis not present

## 2021-03-08 NOTE — Progress Notes (Signed)
ROSHUN, KLINGENSMITH ([id number]) Visit Report for 03/08/2021 Arrival Information Details Patient Name: [name], [name] Date of Service: 03/08/2021 10:00 AM Medical Record Number: [id number] Patient Account Number: [id number] Date of Birth/Sex: 04-18-1951 (70 y.o. M) Treating RN: Cornell Barman Primary Care Zerick Prevette: Felipa Eth Other Clinician: Referring Tiki Tucciarone: Felipa Eth Treating Verneda Hollopeter/Extender: Skipper Cliche in Treatment: 5 Visit Information History Since Last Visit Added or deleted any medications: No Patient Arrived: Cane Has Dressing in Place as Prescribed: Yes Arrival Time: 10:17 Has Compression in Place as Prescribed: Yes Accompanied By: self Has Footwear/Offloading in Place as Prescribed: Yes Transfer Assistance: None Left: Other:surg shoe Patient Identification Verified: Yes Right: Other:surg shoe Secondary Verification Process Completed: Yes Pain Present Now: No Patient Requires Transmission-Based Precautions: No Patient Has Alerts: No Electronic Signature(s) Signed: 03/08/2021 3:17:27 PM By: Gretta Cool, BSN, RN, CWS, Kim RN, BSN Entered By: Gretta Cool, BSN, RN, CWS, Kim on 03/08/2021 10:47:11 [name] ([id number]) -------------------------------------------------------------------------------- Clinic Level of Care Assessment Details Patient Name: [name] Date of Service: 03/08/2021 10:00 AM Medical Record Number: [id number] Patient Account Number: [id number] Date of Birth/Sex: 1950-07-02 (70 y.o. M) Treating RN: Cornell Barman Primary Care Kali Deadwyler: Felipa Eth Other Clinician: Referring Alnita Aybar: Felipa Eth Treating Roquel Burgin/Extender: Skipper Cliche in Treatment: 5 Clinic Level of Care Assessment Items TOOL 3 Quantity Score []  - Use when EandM and Procedure is performed on FOLLOW-UP visit 0 ASSESSMENTS - Nursing Assessment / Reassessment []  - Reassessment of Co-morbidities (includes updates in patient status) 0 []  - 0 Reassessment of  Adherence to Treatment Plan ASSESSMENTS - Wound and Skin Assessment / Reassessment []  - Points for Wound Assessment can only be taken for a new wound of unknown or different etiology and a 0 procedure is NOT performed to that wound []  - 0 Simple Wound Assessment / Reassessment - one wound []  - 0 Complex Wound Assessment / Reassessment - multiple wounds []  - 0 Dermatologic / Skin Assessment (not related to wound area) ASSESSMENTS - Focused Assessment []  - Circumferential Edema Measurements - multi extremities 0 []  - 0 Nutritional Assessment / Counseling / Intervention []  - 0 Lower Extremity Assessment (monofilament, tuning fork, pulses) []  - 0 Peripheral Arterial Disease Assessment (using hand held doppler) ASSESSMENTS - Ostomy and/or Continence Assessment and Care []  - Incontinence Assessment and Management 0 []  - 0 Ostomy Care Assessment and Management (repouching, etc.) PROCESS - Coordination of Care []  - Points for Discharge Coordination can only be taken for a new wound of unknown or different etiology and a 0 procedure is NOT performed to that wound []  - 0 Simple Patient / Family Education for ongoing care []  - 0 Complex (extensive) Patient / Family Education for ongoing care []  - 0 Staff obtains Programmer, systems, Records, Test Results / Process Orders []  - 0 Staff telephones HHA, Nursing Homes / Clarify orders / etc []  - 0 Routine Transfer to another Facility (non-emergent condition) []  - 0 Routine Hospital Admission (non-emergent condition) []  - 0 New Admissions / Biomedical engineer / Ordering NPWT, Apligraf, etc. []  - 0 Emergency Hospital Admission (emergent condition) []  - 0 Simple Discharge Coordination []  - 0 Complex (extensive) Discharge Coordination PROCESS - Special Needs []  - Pediatric / Minor Patient Management 0 []  - 0 Isolation Patient Management []  - 0 Hearing / Language / Visual special needs []  - 0 Assessment of Community assistance  (transportation, D/C planning, etc.) [place], [name] ([id number]) []  - 0 Additional assistance / Altered mentation []  - 0 Support Surface(s) Assessment (bed, cushion, seat, etc.) INTERVENTIONS - Wound  Cleansing / Measurement []  - Points for Wound Cleaning / Measurement, Wound Dressing, Specimen Collection and Specimen taken to lab 0 can only be taken for a new wound of unknown or different etiology and a procedure is NOT performed to that wound []  - 0 Simple Wound Cleansing - one wound []  - 0 Complex Wound Cleansing - multiple wounds []  - 0 Wound Imaging (photographs - any number of wounds) []  - 0 Wound Tracing (instead of photographs) []  - 0 Simple Wound Measurement - one wound []  - 0 Complex Wound Measurement - multiple wounds INTERVENTIONS - Wound Dressings []  - Small Wound Dressing one or multiple wounds 0 []  - 0 Medium Wound Dressing one or multiple wounds []  - 0 Large Wound Dressing one or multiple wounds INTERVENTIONS - Miscellaneous []  - External ear exam 0 []  - 0 Specimen Collection (cultures, biopsies, blood, body fluids, etc.) []  - 0 Specimen(s) / Culture(s) sent or taken to Lab for analysis []  - 0 Patient Transfer (multiple staff / Civil Service fast streamer / Similar devices) []  - 0 Simple Staple / Suture removal (25 or less) []  - 0 Complex Staple / Suture removal (26 or more) []  - 0 Hypo / Hyperglycemic Management (close monitor of Blood Glucose) []  - 0 Ankle / Brachial Index (ABI) - do not check if billed separately []  - 0 Vital Signs Has the patient been seen at the hospital within the last three years: Yes Total Score: 0 Level Of Care: ____ Electronic Signature(s) Signed: 03/08/2021 3:17:27 PM By: Gretta Cool, BSN, RN, CWS, Kim RN, BSN Entered By: Gretta Cool, BSN, RN, CWS, Kim on 03/08/2021 10:52:43 [name] ([id number]) -------------------------------------------------------------------------------- Encounter Discharge Information Details Patient Name: [name] Date of Service: 03/08/2021 10:00 AM Medical Record Number: [id number] Patient Account Number: [id number] Date of Birth/Sex: 01/16/1951 (70 y.o. M) Treating RN: Cornell Barman Primary Care Aletha Allebach: Felipa Eth Other Clinician: Referring Lita Flynn: Felipa Eth Treating Raydell Maners/Extender: Skipper Cliche in Treatment: 5 Encounter Discharge Information Items Post Procedure Vitals Discharge Condition: Stable Temperature (F): 97.8 Ambulatory Status: Cane Pulse (bpm): 65 Discharge Destination: Home Respiratory Rate (breaths/min): 18 Transportation: Private Auto Blood Pressure (mmHg): 128/78 Accompanied By: self Schedule Follow-up Appointment: Yes Clinical Summary of Care: Electronic Signature(s) Signed: 03/08/2021 3:17:27 PM By: Gretta Cool, BSN, RN, CWS, Kim RN, BSN Entered By: Gretta Cool, BSN, RN, CWS, Kim on 03/08/2021 11:25:42 [name] ([id number]) -------------------------------------------------------------------------------- Lower Extremity Assessment Details Patient Name: [name] Date of Service: 03/08/2021 10:00 AM Medical Record Number: [id number] Patient Account Number: [id number] Date of Birth/Sex: 03/04/1951 (70 y.o. M) Treating RN: Cornell Barman Primary Care Jessicalynn Deshong: Felipa Eth Other Clinician: Referring Cecille Mcclusky: Felipa Eth Treating Dehaven Sine/Extender: Jeri Cos Weeks in Treatment: 5 Edema Assessment Assessed: [Left: No] [Right: No] [Left: Edema] [Right: :] Calf Left: Right: Point of Measurement: 35 cm From Medial Instep 44 cm 42 cm Ankle Left: Right: Point of Measurement: 10 cm From Medial Instep 46 cm 38 cm Vascular Assessment Pulses: Dorsalis Pedis Palpable: [Left:No Yes] [Right:No Yes] Notes Non-palpable [name] to swelling in feet. Electronic Signature(s) Signed: 03/08/2021 3:17:27 PM By: Gretta Cool, BSN, RN, CWS, Kim RN, BSN Entered By: Gretta Cool, BSN, RN, CWS, Kim on 03/08/2021 10:33:19 [name]  ([id number]) -------------------------------------------------------------------------------- Multi Wound Chart Details Patient Name: [name] Date of Service: 03/08/2021 10:00 AM Medical Record Number: [id number] Patient Account Number: [id number] Date of Birth/Sex: 10-Aug-1950 (70 y.o. M) Treating RN: Cornell Barman Primary Care Katrisha Segall: Felipa Eth Other Clinician: Referring Marquia Costello: Felipa Eth Treating Viviene Thurston/Extender: Jeri Cos Weeks in Treatment: 5 Vital Signs Height(in):  73 Pulse(bpm): 65 Weight(lbs): 312 Blood Pressure(mmHg): 123/72 Body Mass Index(BMI): 41 Temperature(F): 97.8 Respiratory Rate(breaths/min): 18 Photos: [N/A:N/A] Wound Location: Right, Medial Lower Leg N/A N/A Wounding Event: Blister N/A N/A Primary Etiology: Lymphedema N/A N/A Comorbid History: Hypertension, Peripheral Venous N/A N/A Disease, Type II Diabetes, Neuropathy, Received Chemotherapy Date Acquired: 03/08/2021 N/A N/A Weeks of Treatment: 0 N/A N/A Wound Status: Open N/A N/A Measurements L x W x D (cm) 2x5x0.5 N/A N/A Area (cm) : 7.854 N/A N/A Volume (cm) : 3.927 N/A N/A Classification: Full Thickness Without Exposed N/A N/A Support Structures Exudate Amount: Large N/A N/A Exudate Type: Serous N/A N/A Exudate Color: amber N/A N/A Granulation Amount: Large (67-100%) N/A N/A Necrotic Amount: None Present (0%) N/A N/A Exposed Structures: Fat Layer (Subcutaneous Tissue): N/A N/A Yes Fascia: No Tendon: No Muscle: No Joint: No Bone: No Epithelialization: None N/A N/A Treatment Notes Electronic Signature(s) Signed: 03/08/2021 3:17:27 PM By: Gretta Cool, BSN, RN, CWS, Kim RN, BSN Entered By: Gretta Cool, BSN, RN, CWS, Kim on 03/08/2021 10:49:57 [name] ([id number]) -------------------------------------------------------------------------------- Multi-Disciplinary Care Plan Details Patient Name: [name] Date of Service: 03/08/2021 10:00 AM Medical Record Number:  [id number] Patient Account Number: [id number] Date of Birth/Sex: November 28, 1950 (70 y.o. M) Treating RN: Cornell Barman Primary Care Kiyla Ringler: Felipa Eth Other Clinician: Referring Laurey Salser: Felipa Eth Treating Layci Stenglein/Extender: Skipper Cliche in Treatment: 5 Active Inactive Wound/Skin Impairment Nursing Diagnoses: Knowledge deficit related to ulceration/compromised skin integrity Goals: Patient/caregiver will verbalize understanding of skin care regimen Date Initiated: 01/26/2021 Target Resolution Date: 02/25/2021 Goal Status: Active Interventions: Assess patient/caregiver ability to obtain necessary supplies Assess patient/caregiver ability to perform ulcer/skin care regimen upon admission and as needed Assess ulceration(s) every visit Notes: Electronic Signature(s) Signed: 03/08/2021 3:17:27 PM By: Gretta Cool, BSN, RN, CWS, Kim RN, BSN Entered By: Gretta Cool, BSN, RN, CWS, Kim on 03/08/2021 10:49:50 [name] ([id number]) -------------------------------------------------------------------------------- Non-Wound Condition Assessment Details Patient Name: [name] Date of Service: 03/08/2021 10:00 AM Medical Record Number: [id number] Patient Account Number: [id number] Date of Birth/Sex: Mar 14, 1951 (70 y.o. M) Treating RN: Cornell Barman Primary Care Kelly Eisler: Felipa Eth Other Clinician: Referring Jamecia Lerman: Felipa Eth Treating Taneika Choi/Extender: Skipper Cliche in Treatment: 5 Non-Wound Condition: Condition: Lymphedema Location: Leg Side: Left Notes: Anterior aspect of lower leg Photos Electronic Signature(s) Signed: 03/08/2021 3:17:27 PM By: Gretta Cool, BSN, RN, CWS, Kim RN, BSN Entered By: Gretta Cool, BSN, RN, CWS, Kim on 03/08/2021 10:34:24 [name] ([id number]) -------------------------------------------------------------------------------- Pain Assessment Details Patient Name: [name] Date of Service: 03/08/2021 10:00 AM Medical Record Number:  [id number] Patient Account Number: [id number] Date of Birth/Sex: 07-21-1950 (70 y.o. M) Treating RN: Cornell Barman Primary Care Kennen Stammer: Felipa Eth Other Clinician: Referring Gilda Abboud: Felipa Eth Treating Marcelino Campos/Extender: Skipper Cliche in Treatment: 5 Active Problems Location of Pain Severity and Description of Pain Patient Has Paino No Site Locations Pain Management and Medication Current Pain Management: Notes Patient denies pain at this time. Electronic Signature(s) Signed: 03/08/2021 3:17:27 PM By: Gretta Cool, BSN, RN, CWS, Kim RN, BSN Entered By: Gretta Cool, BSN, RN, CWS, Kim on 03/08/2021 10:47:18 [name] ([id number]) -------------------------------------------------------------------------------- Patient/Caregiver Education Details Patient Name: [name], [name] Date of Service: 03/08/2021 10:00 AM Medical Record Number: [id number] Patient Account Number: [id number] Date of Birth/Gender: 07-Jan-1951 (70 y.o. M) Treating RN: Cornell Barman Primary Care Physician: Felipa Eth Other Clinician: Referring Physician: Felipa Eth Treating Physician/Extender: Skipper Cliche in Treatment: 5 Education Assessment Education Provided To: Patient Education Topics Provided Wound/Skin Impairment: Handouts: Caring for Your Ulcer Methods: Demonstration, Explain/Verbal Responses: State content correctly Electronic Signature(s) Signed:  03/08/2021 3:17:27 PM By: Gretta Cool, BSN, RN, CWS, Kim RN, BSN Entered By: Gretta Cool, BSN, RN, CWS, Kim on 03/08/2021 10:53:08 [name] ([id number]) -------------------------------------------------------------------------------- Wound Assessment Details Patient Name: [name], [name] Date of Service: 03/08/2021 10:00 AM Medical Record Number: [id number] Patient Account Number: [id number] Date of Birth/Sex: 22-May-1950 (70 y.o. M) Treating RN: Cornell Barman Primary Care Markiya Keefe: Felipa Eth Other Clinician: Referring Mireyah Chervenak: Felipa Eth Treating Bilal Manzer/Extender: Jeri Cos Weeks in Treatment: 5 Wound Status Wound Number: 15 Primary Lymphedema Etiology: Wound Location: Right, Medial Lower Leg Wound Open Wounding Event: Blister Status: Date Acquired: 03/08/2021 Comorbid Hypertension, Peripheral Venous Disease, Type II Weeks Of Treatment: 0 History: Diabetes, Neuropathy, Received Chemotherapy Clustered Wound: No Photos Wound Measurements Length: (cm) 2 Width: (cm) 5 Depth: (cm) 0.5 Area: (cm) 7.854 Volume: (cm) 3.927 % Reduction in Area: % Reduction in Volume: Epithelialization: None Tunneling: No Undermining: No Wound Description Classification: Full Thickness Without Exposed Support Structures Exudate Amount: Large Exudate Type: Serous Exudate Color: amber Foul Odor After Cleansing: No Slough/Fibrino No Wound Bed Granulation Amount: Large (67-100%) Exposed Structure Necrotic Amount: None Present (0%) Fascia Exposed: No Fat Layer (Subcutaneous Tissue) Exposed: Yes Tendon Exposed: No Muscle Exposed: No Joint Exposed: No Bone Exposed: No Treatment Notes Wound #15 (Lower Leg) Wound Laterality: Right, Medial Cleanser Peri-Wound Care Topical Primary Dressing [place], [name] ([id number]) Telfa Adhesive [place] Dressing, 6x6 (in/in) Secondary Dressing Zetuvit Plus Silicone Non-bordered 5x5 (in/in) Secured With Compression Wrap Medichoice 4 layer Compression System, 35-40 mmHG Discharge Instruction: Apply multi-layer wrap as directed. Compression Stockings Environmental education officer) Signed: 03/08/2021 3:17:27 PM By: Gretta Cool, BSN, RN, CWS, Kim RN, BSN Entered By: Gretta Cool, BSN, RN, CWS, Kim on 03/08/2021 10:49:33 [name] ([id number]) -------------------------------------------------------------------------------- [place] Details Patient Name: [name] Date of Service: 03/08/2021 10:00 AM Medical Record Number: [id number] Patient Account Number: [id number] Date of Birth/Sex:  04/01/1951 (70 y.o. M) Treating RN: Cornell Barman Primary Care Maurisha Mongeau: Felipa Eth Other Clinician: Referring Yuval Nolet: Felipa Eth Treating Jackson Coffield/Extender: Skipper Cliche in Treatment: 5 Vital Signs Time Taken: 10:18 Temperature (F): 97.8 Height (in): 73 Pulse (bpm): 65 Weight (lbs): 312 Respiratory Rate (breaths/min): 18 Body Mass Index (BMI): 41.2 Blood Pressure (mmHg): 123/72 Reference Range: 80 - 120 mg / dl Electronic Signature(s) Signed: 03/08/2021 3:17:27 PM By: Gretta Cool, BSN, RN, CWS, Kim RN, BSN Entered By: Gretta Cool, BSN, RN, CWS, Kim on 03/08/2021 10:47:14

## 2021-03-08 NOTE — Progress Notes (Addendum)
CHRISTION, LEONHARD ([id number]) Visit Report for 03/08/2021 Chief Complaint Document Details Patient Name: [name], [name] Date of Service: 03/08/2021 10:00 AM Medical Record Number: [id number] Patient Account Number: [id number] Date of Birth/Sex: 05/19/1950 (70 y.o. M) Treating RN: Cornell Barman Primary Care Provider: Felipa Eth Other Clinician: Referring Provider: Felipa Eth Treating Provider/Extender: Skipper Cliche in Treatment: 5 Information Obtained from: Patient Chief Complaint Left LE ulcers Electronic Signature(s) Signed: 03/08/2021 10:34:54 AM By: Worthy Keeler PA-C Entered By: Worthy Keeler on 03/08/2021 10:34:54 [name] ([id number]) -------------------------------------------------------------------------------- Debridement Details Patient Name: [name] Date of Service: 03/08/2021 10:00 AM Medical Record Number: [id number] Patient Account Number: [id number] Date of Birth/Sex: 1951/02/10 (70 y.o. M) Treating RN: Cornell Barman Primary Care Provider: Felipa Eth Other Clinician: Referring Provider: Felipa Eth Treating Provider/Extender: Skipper Cliche in Treatment: 5 Debridement Performed for Wound #15 Right,Medial Lower Leg Assessment: Performed By: Physician Tommie Sams., PA-C Debridement Type: Debridement Level of Consciousness (Pre- Awake and Alert procedure): Pre-procedure Verification/Time Out Yes - 10:45 Taken: Total Area Debrided (L x W): 2 (cm) x 5 (cm) = 10 (cm) Tissue and other material Non-Viable, Skin: Dermis , Skin: Epidermis debrided: Level: Skin/Epidermis Debridement Description: Selective/Open Wound Instrument: Other : gauze Bleeding: None Hemostasis Achieved: Pressure Response to Treatment: Procedure was tolerated well Level of Consciousness (Post- Awake and Alert procedure): Post Debridement Measurements of Total Wound Length: (cm) 2 Width: (cm) 5 Depth: (cm) 0.5 Volume: (cm) 3.927 Character of  Wound/Ulcer Post Debridement: Stable Post Procedure Diagnosis Same as Pre-procedure Electronic Signature(s) Signed: 03/08/2021 3:17:27 PM By: Gretta Cool, BSN, RN, CWS, Kim RN, BSN Signed: 03/08/2021 3:58:49 PM By: Worthy Keeler PA-C Entered By: Gretta Cool, BSN, RN, CWS, Kim on 03/08/2021 10:51:12 [name] ([id number]) -------------------------------------------------------------------------------- HPI Details Patient Name: [name] Date of Service: 03/08/2021 10:00 AM Medical Record Number: [id number] Patient Account Number: [id number] Date of Birth/Sex: 01-23-1951 (70 y.o. M) Treating RN: Cornell Barman Primary Care Provider: Felipa Eth Other Clinician: Referring Provider: Felipa Eth Treating Provider/Extender: Skipper Cliche in Treatment: 5 History of Present Illness HPI Description: 70 year old male who presented to the ER with bilateral lower extremity blisters which had started last week. he has a past medical history of leukemia, diabetes mellitus, hypertension, edema of both lower extremities, his recurrent skin infections, peripheral vascular disease, coronary artery disease, congestive heart failure and peripheral neuropathy. in the ER he was given Rocephin and put on Silvadene cream. he was put on oral doxycycline and was asked to follow-up with the [facility] His last hemoglobin A1c was 6.6 in December and he checks his blood sugar once a week. He does not have any physicians outside the [place] system. He does not recall any vascular duplex studies done either for arterial or venous disease but was told to wear compression stockings which he does not use 05/30/2016 -- we have not yet received any of his notes from the [facility] hospital system and his arterial and venous duplex studies are scheduled here in [place] around mid February. We are unable to have his insurance accepted by home health agencies and hence he is getting dressings only once a week. 06/06/16 -- -- I  received a call from the patient's PCP at the [facility] and spoke to Dr. Garvin Fila, phone number [phone] and fax number [phone]. She confirmed that no vascular testing was done over the last 5 years and she would be happy to do them if the patient did want them to be done at the [place]  and we could fax him a request. Readmission: 70 year old male seen by as in February of this year and was referred to vein and vascular for studies and opinion from the vascular surgeons. The patient returns today with a fresh problem having had blisters on his left lower extremity which have been there for about 5 days and he clearly states that he has been wearing his compression stockings as advised though he could not read the moderate compression and has been wearing light compression. Review of his electronic medical records note that he had lower extremity arterial duplex examination done on 06/23/2016 which showed no hemodynamically significant stenosis in the bilateral lower extremity arterial system. He also had a lower extremity venous reflux examination done on 07/07/2016 and it was noted that he had venous incompetence in the right great saphenous vein and bilateral common femoral veins. Patient was seen by Dr. Tamala Julian on the same day and for some reason his notes do not reflect the venous studies or the arterial studies and he recommended patient do a venous duplex ultrasound to look for reflux and return to see him.he would also consider a lymph pump if required. The patient was told that his workup was normal and hence the patient canceled his follow-up appointment. 02/03/17 on evaluation today patient left medial lower extremity blister appears to be doing about the same. It is still continuing to drain and there's still the blistered skin covering the wound bed which is making it difficult for the alternate to do its job. Fortunately there is no evidence of cellulitis. No fevers chills  noted. Patient states in general he is not having any significant discomfort. Patient's lower extremity arterial duplex exam revealed that patient was hemodynamically stable with no evidence of stenosis in regard to the bilateral lower extremities. The lower extremity venous reflux exam revealed the patient had venous incontinence noted in the right greater saphenous and bilateral common femoral vein. There is no evidence of deep or superficial vein thrombosis in the bilateral lower extremities. Readmission: 11/12/18 Patient presents for evaluation our clinic today concerning issues that he is having with his left lower extremity. He tells me that a couple weeks ago he began developing blisters on the left lower extremity along with increased swelling. He typically wears his compression stockings on a regular basis is previously been evaluated both here as well is with vascular surgery they would recommend lymphedema pumps but unfortunately that somehow fell through and he never heard anything back from that. Nonetheless I think lymphedema pumps would be beneficial for this patient. He does have a history of hypertension and diabetes. Obviously the chronic venous stasis and lymphedema as well. At this point the blisters have been given in more trouble he states sometimes when the blisters openings able to clean it down with alcohol and it will dry out and do well. Unfortunately that has not been the case this time. He is having some discomfort although this mean these with cleaning the areas he doesn't have discomfort just on a regular basis. He has not been able to wear his compression stockings since the blisters arose due to the fact that of course it will drain into the socks causing additional issues and he didn't have any way to wrap this otherwise. He has increased to taking his Lasix every day instead of every other day. He sees his primary care provider later this month as well. No fevers,  chills, nausea, or vomiting noted at this time. 11/19/18-Patient returns  at 1 week, per intake RN the amount of seepage into the compression wraps was definitely improved, overall all the wounds are measuring smaller but continuing silver alginate to the wounds as primary dressing 11/26/18 on evaluation today patient appears to be doing quite well in regard to his left lower [place] ulcers. In fact of the areas that were noted initially he only has two regions still open. There is no evidence of active infection at this time. He still is not heard anything from the company regarding lymphedema pumps as of yet. Again as previously seen vascular they have not recommended any surgical intervention. 12/03/2018 on evaluation today patient actually appears to be doing quite well with regard to his lower extremity ulcers. In fact most of the areas appear to be healed the one spot which does not seem to be completely healed I am unsure of whether or not this is really draining that much but nonetheless there does not appear to be any signs of infection or significant drainage at this point. There is no sign of fever, chills, nausea, vomiting, or diarrhea. Overall I am pleased with how things have progressed I think is very close to being able to transition to his home compression stockings. HARTWELL, VANDIVER ([id number]) 12/10/2018 upon evaluation today patient appears to be doing quite well with regard to his left lower extremity. He has been tolerating the dressing changes without complication. Fortunately there is no signs of active infection at this time. He appears after thorough evaluation of his leg to only have 1 small area that remains open at this point everything else appears to be almost completely closed. He still have significant swelling of the left lower extremity. We had discussed discussing this with his primary care provider he is not able to see her in person they were at the [facility] and right  now the [place] is not seeing patients on site. According to the patient anyway. Subsequently he did speak with her apparently and his primary care provider feels that he may likely have a DVT. With that being said she has not seen his leg she is just going off of his history. Nonetheless that is a concern that the patient now has as well and while I do not feel the DVT is likely we can definitely ensure that that is not the case I will go ahead and see about putting that order in today. Nonetheless otherwise I am in a recommend that we continue with the current wound care measures including the compression therapy most likely. We just need to ensure that his leg is indeed free of any DVTs. 12/17/2018 on evaluation today patient actually appears to be completely healed today. He does have 2 very small areas of blistering although this is not anything too significant at this point which is good news. With that being said I am in agreement with the fact that I think he is completely healed at this point. He does want to get back into his compression stocking. The good news is we have gotten approval from insurance for his lymphedema pumps we received a letter since last saw him last week. The other good news is his study did come back and showed no evidence of a DVT. 12/20/2018 on evaluation today patient presents for follow-up concerning his ongoing issues with his left lower extremity. He was actually discharged last Friday and did fairly well until he states blisters opened this morning. He tells me he has been wearing his  compression stocking although he has a hard time getting this on. There does not appear to be any signs of active infection at this time. No fevers, chills, nausea, vomiting, or diarrhea. 12/27/2018 on evaluation today patient appears to be doing very well with regard to his swelling of the left lower extremity the 4 layer compression wrap seems to have been beneficial for him. Fortunately  there is no signs of active infection at this time. Patient has been tolerating the compression wrap without complication and his foot swelling in particular appears to be greatly improved. He does still have a wound on the lateral portion of his left leg I believe this is more of a blister that has now reopened. 01/03/2019 on evaluation today patient actually appears to be doing excellent in regard to his left lower extremity. He did receive his compression pumps and is actually use this 7 times since he was last here in the office. On top of the compression wrap he is now roughly 3 cm better at the calf and 2 cm better at the ankle he also states that his foot seem to go an issue better without even having to use a shoe horn. Obviously I think this is all evidence that he is doing excellent in this regard. The other good news is he does not appear to have anything open today as far as wounds are concerned. 01/15/2019 on evaluation today patient appears to be doing more poorly yet again with regard to his left lower extremity. He has developed new wounds again after being discharged just recently. Unfortunately this continues to be the case that he will heal and then have subsequent new wounds. The last time I was hopeful that he may not end up coming back too quickly especially since he states he has been using his lymphedema pumps along with wearing his compression. Nonetheless he had a blister on the back of his leg that popped up on the left and this has opened up into an ulceration it is quite painful. 01/22/19 on evaluation today patient actually appears to be doing well with regard to his wound on the left lower extremity. He's been tolerating the dressing changes without complication including the compression wrap in the wound appears to be significantly smaller today which is great news. Overall very pleased in this regard. 01/29/2019 on evaluation today patient appears to be doing well with  regard to his left posterior lower extremity ulcer. He has been tolerating the dressing changes without complication. This is not completely healed but is getting much closer. We did order a Farrow wrap 4000 for him he has received this and has it with him today although I am not sure we are quite ready to start him on that as of yet. We are very close. 02/05/2019 on evaluation today patient actually appears to be doing quite well with regard to his left posterior lower extremity ulcer. He still has a very tiny opening remaining but the fortunate thing is he seems to be healing quite nicely. He also did get his Farrow wrap which I am hoping will help with his edema control as well at home. Fortunately there is no evidence of active infection. 02/12/2019 patient and fortunately appears to be doing poorly in regard to his wounds of the left lower extremity. He was very close to healing therefore we attempted to use his Velcro compression wraps continuing with lymphedema pumps at home. Unfortunately that does not seem to have done very well  for him. He tells me that he wore them all the time but again I am not sure why if that is the case that he is having such significant edema. He is still on his fluid pills as well. With that being said there is no obvious sign of infection although I do wonder about the possibility of infection at this time as well. 02/19/2019 unfortunately upon evaluation today patient appears to be doing more poorly with regard to his left lower extremity. He is not showing signs of significant improvement and I think the biggest issue here is that he does have an infection that appears to likely be Pseudomonas. That is based on the blue-green drainage that were noted at this time. Unfortunately the antibiotic that has been on is not going to take care of this at all. I think they will get a need to switch him to either Levaquin or Cipro and this was discussed with the  patient. 02/26/2019 on evaluation today patient's lower extremity on the left appears to be doing significantly better as compared to last evaluation. Fortunately there is no signs of active infection at this time. He has been tolerating the compression wrap without complication in fact he made it the whole week at this point. He is showing signs of excellent improvement I am very happy in this regard. With that being said he is having some issues with infection we did review the results of his culture which I noted today. He did have a positive finding for Enterobacter as well as Alcaligenes faecalis. Fortunately the Levaquin that I placed him on will work for both which is great news. There is no signs of systemic infection at this point. 10/30; left posterior leg wound in the setting of very significant edema and what looks like chronic venous inflammation. He has compression pumps but does not use them. We have been using 3 layer compression. Silver alginate to the wound as the primary dressing 03/18/2019 on evaluation today patient appears to be doing a little better compared to last time I saw him. He really has not been using his compression pumps he tells me that he is having too much discomfort. He has been keeping his wraps on however. He is only been taking his fluid pills every other day because he states they are not really helping and he has an appointment with his primary care provider at the [facility] tomorrow. Subsequently the wound itself on the left lower extremity does seem to be greatly improved compared to previous. 03/25/2019 on evaluation today patient appears to be doing better with regard to his wounds on the bilateral lower extremities. The left is doing excellent the right is also doing better although both still do show some signs of open wounds noted at this point unfortunately. Fortunately there is no signs of active infection at this time. The patient also is not really having  any significant pain which is good news. Unfortunately there was some confusion with the referral on vascular disease and as far as getting the patient scheduled there can be contacting him later today to do this NECO, KLING ([id number]) fortunately we got this straightened out. 04/01/2019 on evaluation today patient appears to be doing no fevers, chills, nausea, vomiting, or diarrhea. Excellent at this time with regard to his lower extremities. There does not appear to be any open wound at this point which is good news. Fortunately is also no signs of active infection at this time. Overall feel like  the patient has done excellent with the compression the problem is every time we got him to this point and then subsequently go to using his own compression things just go right back to where they were. I am not sure how to address this we can try to get an appointment with vascular for 2 weeks now they have yet to call him. Obviously this has become frustrating for the patient as well. I think the issue has just been an honest error as far as scheduling is concerned but nonetheless still worn out the point where I am unsure of which direction we should take. 04/08/2019 on evaluation today patient actually appears to be doing well with regard to his lower extremities. There are no open wounds at this time and things seem to be managing quite nicely as far as the overall edema control is concerned. With that being said he does have his compression socks today for [place] to go ahead and reinitiate therapy in that manner at this point. He is going to be going for shoes to be measured on Wednesday and then coincidentally he will also be seeing vascular on Thursday. Overall I think this is good news and again I am hopeful that they will be able to do something for him to help prevent ongoing issues with edema control as well. No fevers, chills, nausea, vomiting, or diarrhea. 04/11/2019 on evaluation today  patient actually appears to be doing poorly after just being discharged on Monday of this week. He had been experiencing issues with again blisters especially on the left lower extremity. With that being said he was completely healed and appeared to be doing great this past Monday. He then subsequently has new blisters that formed before his appointment with vascular this morning. He was also measured for shoes in the interim. With that being said we may have figured out what exactly is going on and why he continues to have issues like what we are seeing at this point. He takes his compression stockings off at nighttime and then he ends up having to sleep in his chair for 5-6 hours a night. He sleeps with his feet down he cannot really get him up in the recliner and therefore he is sleeping and the worst possible his position with his feet on the floor for that majority of the time. Again as I explained to him that is about one third at minimum at least one fourth of his day that he spending with his feet dangling down on the ground and the worst possible position they could be. I think this may be what is causing the issue. Subsequently I am leaning toward thinking that he may need a hospital bed in order to elevate his legs. We likely can have to coordinate this with his primary care provider at the [facility]. Readmission: 01/26/2021 this is a patient who presents for repeat evaluation here in the clinic although it is actually been couple of years since have seen him in fact it was December 2020 when I last saw him. Subsequently he never really healed but did end up being lost to follow-up. He tells me has been having issues ongoing with his lower extremities has bilateral lower extremity lymphedema no real significant or definitive open wounds but in general his lymphedema is way out of control. We were never able to refer him to lymphedema clinic simply due to the fact to be honest we were never  able to get him completely  healed. I do not see anyone with open wounds. The patient does have evidence of type 2 diabetes mellitus, lymphedema, chronic venous insufficiency, and hypertension. That really has not changed since his last evaluation. 02/09/2021 upon evaluation today patient appears to be doing a little better in regard to his legs although he still having a tremendous amount of drainage especially on the left leg. Fortunately there does not appear to be any evidence of active infection. Of note when we looked into this further it appears that the patient did not have any absorptive dressing on it was just the 4-layer compression wrap. Nonetheless this is probably big part of the issue here. 10/10; he comes in today with 3 large areas on the upper right lower leg likely remanence of denuded blistering under his compression wraps. He has no other wounds on the right. On the left he has the denuded area on the left medial foot and ankle and on the left dorsal foot. Massive lymphedema in both feet dorsally. Using Zetuvit under compression We have increased home health visitation to twice a week to change the dressings and will change it once 02/22/2021 upon evaluation today patient appears to be doing well currently with regard to his wounds. He has been tolerating the dressing changes without complication. Fortunately there does not appear to be any evidence of active infection which is great news. No fevers, chills, nausea, vomiting, or diarrhea. The biggest issue I see currently is that home health is not putting any medicine on the actual wounds before wrapping. 03/01/2021 upon evaluation today the patient's right leg actually appears to be doing quite well which is great news there does not appear to be any evidence of active infection at this time. No fevers, chills, nausea, vomiting, or diarrhea. With that being said the patient is having issues on the left foot where he is having  significant drainage is also an ammonia smell he does not have any animals at home and this makes me concerned about a bacteria producing urea as a byproduct. Again the possible common organisms will be E. coli, Proteus, and Enterococcus. All 3 of which can be successfully treated with Levaquin. For that reason I think that this may be a good option for [place] to consider placing him on and I did obtain a culture as well for confirmation sake. 03/08/2021 upon evaluation today patient appears to be doing unfortunately still somewhat poorly in regard to his leg ulcerations. He actually has an area on the right leg where he blistered due to the fact that his wrap slid down and caused an area of pinching on his skin and this has led to a significant issue here. Electronic Signature(s) Signed: 03/08/2021 2:26:30 PM By: Worthy Keeler PA-C Entered By: Worthy Keeler on 03/08/2021 14:26:29 [name] ([id number]) -------------------------------------------------------------------------------- Physical Exam Details Patient Name: [name], [name] Date of Service: 03/08/2021 10:00 AM Medical Record Number: [id number] Patient Account Number: [id number] Date of Birth/Sex: 12/01/1950 (70 y.o. M) Treating RN: Cornell Barman Primary Care Provider: Felipa Eth Other Clinician: Referring Provider: Felipa Eth Treating Provider/Extender: Jeri Cos Weeks in Treatment: 5 Constitutional Well-nourished and well-hydrated in no acute distress. Respiratory normal breathing without difficulty. Psychiatric this patient is able to make decisions and demonstrates good insight into disease process. Alert and Oriented x 3. pleasant and cooperative. Notes Upon inspection patient's leg still showed signs of significant edema and again he had some blistering on the right leg where there was an area where the wrap  slipped up on him. Again I think that we are still struggling to get this under control. He did not have  anything absorptive over top of the Sorbact over the feet either which is also of concern. Electronic Signature(s) Signed: 03/08/2021 2:46:48 PM By: Worthy Keeler PA-C Entered By: Worthy Keeler on 03/08/2021 14:46:48 [name] ([id number]) -------------------------------------------------------------------------------- Physician Orders Details Patient Name: [name] Date of Service: 03/08/2021 10:00 AM Medical Record Number: [id number] Patient Account Number: [id number] Date of Birth/Sex: 1950/08/13 (70 y.o. M) Treating RN: Cornell Barman Primary Care Provider: Felipa Eth Other Clinician: Referring Provider: Felipa Eth Treating Provider/Extender: Skipper Cliche in Treatment: 5 Verbal / Phone Orders: No Diagnosis Coding ICD-10 Coding Code Description E11.622 Type 2 diabetes mellitus with other skin ulcer I89.0 Lymphedema, not elsewhere classified I87.2 Venous insufficiency (chronic) (peripheral) L97.822 Non-pressure chronic ulcer of other part of left lower leg with fat layer exposed L97.512 Non-pressure chronic ulcer of other part of right foot with fat layer exposed I10 Essential (primary) hypertension L97.811 Non-pressure chronic ulcer of other part of right lower leg limited to breakdown of skin Follow-up Appointments o Return Appointment in 1 week. [place] for wound care. May utilize formulary equivalent dressing for wound treatment orders unless otherwise specified. Home Health Nurse may visit PRN to address patientos wound care needs. - frequency 3 times per week - patient will be seen once at wound center - home health to see patient 2 times per week on Wed and friday Bathing/ Shower/ Hygiene o May shower with wound dressing protected with water repellent cover or cast protector. Edema Control - Lymphedema / Segmental Compressive Device / Other Bilateral Lower Extremities o Optional: One layer of unna paste to top of  compression wrap (to act as an anchor). - Unna paste on feet and calf to secure wrap in place o 4 Layer Compression System Lymphedema. - bi lat, apply sorbact to toe area, apply zetuvits/ or formulary for extra absorbant dressing around ankles and foot area, 3 times per week o Elevate, Exercise Daily and Avoid Standing for Long Periods of Time. o Elevate legs to the level of the heart and pump ankles as often as possible o Elevate leg(s) parallel to the floor when sitting. o Compression Pump: Use compression pump on left lower extremity for 60 minutes, twice daily. - Start today o Compression Pump: Use compression pump on right lower extremity for 60 minutes, twice daily. - Start today o DO YOUR BEST to sleep in the bed at night. DO NOT sleep in your recliner. Long hours of sitting in a recliner leads to swelling of the legs and/or potential wounds on your backside. o Other: - Contact prescriber regarding use of diuretics to reduce fluid overload. Off-Loading Bilateral Lower Extremities o Open toe surgical shoe Wound Treatment Wound #15 - Lower Leg Wound Laterality: Right, Medial Primary Dressing: Silvercel 4 1/4x 4 1/4 (in/in) (Generic) Discharge Instructions: Apply Silvercel 4 1/4x 4 1/4 (in/in) as instructed Secondary Dressing: Zetuvit Plus Silicone Non-bordered 5x5 (in/in) Compression Wrap: Medichoice 4 layer Compression System, 35-40 mmHG (Generic) Discharge Instructions: Apply multi-layer wrap as directed. Consults o Other Physician Referral - PCP for fluid pill to decrease fluid. [place], [name] ([id number]) Notes Generic dressings may be substituted for name brands, if necessary. Electronic Signature(s) Signed: 03/08/2021 3:17:27 PM By: Gretta Cool, BSN, RN, CWS, Kim RN, BSN Signed: 03/08/2021 3:58:49 PM By: Worthy Keeler PA-C Entered By: Gretta Cool, BSN, RN, CWS, Kim on 03/08/2021 13:18:27 [place],  [name]  ([id number]) -------------------------------------------------------------------------------- Problem List Details Patient Name: [name], [name] Date of Service: 03/08/2021 10:00 AM Medical Record Number: [id number] Patient Account Number: [id number] Date of Birth/Sex: 1951/04/27 (70 y.o. M) Treating RN: Cornell Barman Primary Care Provider: Felipa Eth Other Clinician: Referring Provider: Felipa Eth Treating Provider/Extender: Skipper Cliche in Treatment: 5 Active Problems ICD-10 Encounter Code Description Active Date MDM Diagnosis E11.622 Type 2 diabetes mellitus with other skin ulcer 01/26/2021 No Yes I89.0 Lymphedema, not elsewhere classified 01/26/2021 No Yes I87.2 Venous insufficiency (chronic) (peripheral) 01/26/2021 No Yes L97.822 Non-pressure chronic ulcer of other part of left lower leg with fat layer 01/26/2021 No Yes exposed L97.512 Non-pressure chronic ulcer of other part of right foot with fat layer 01/26/2021 No Yes exposed [place] (primary) hypertension 01/26/2021 No Yes L97.811 Non-pressure chronic ulcer of other part of right lower leg limited to 02/15/2021 No Yes breakdown of skin Inactive Problems Resolved Problems Electronic Signature(s) Signed: 03/08/2021 10:34:42 AM By: Worthy Keeler PA-C Entered By: Worthy Keeler on 03/08/2021 10:34:42 [name] ([id number]) -------------------------------------------------------------------------------- Progress Note Details Patient Name: [name] Date of Service: 03/08/2021 10:00 AM Medical Record Number: [id number] Patient Account Number: [id number] Date of Birth/Sex: 06-28-1950 (70 y.o. M) Treating RN: Cornell Barman Primary Care Provider: Felipa Eth Other Clinician: Referring Provider: Felipa Eth Treating Provider/Extender: Skipper Cliche in Treatment: 5 Subjective Chief Complaint Information obtained from Patient Left LE ulcers History of Present Illness (HPI) 70 year old male  who presented to the ER with bilateral lower extremity blisters which had started last week. he has a past medical history of leukemia, diabetes mellitus, hypertension, edema of both lower extremities, his recurrent skin infections, peripheral vascular disease, coronary artery disease, congestive heart failure and peripheral neuropathy. in the ER he was given Rocephin and put on Silvadene cream. he was put on oral doxycycline and was asked to follow-up with the [facility]. His last hemoglobin A1c was 6.6 in December and he checks his blood sugar once a week. He does not have any physicians outside the [place] system. He does not recall any vascular duplex studies done either for arterial or venous disease but was told to wear compression stockings which he does not use 05/30/2016 -- we have not yet received any of his notes from the [facility] hospital system and his arterial and venous duplex studies are scheduled here in [place] around mid February. We are unable to have his insurance accepted by home health agencies and hence he is getting dressings only once a week. 06/06/16 -- -- I received a call from the patient's PCP at the [facility] at [facility] and spoke to Dr. Garvin Fila, phone number [phone] and fax number [phone]. She confirmed that no vascular testing was done over the last 5 years and she would be happy to do them if the patient did want them to be done at the [place] and we could fax him a request. Readmission: 70 year old male seen by as in February of this year and was referred to vein and vascular for studies and opinion from the vascular surgeons. The patient returns today with a fresh problem having had blisters on his left lower extremity which have been there for about 5 days and he clearly states that he has been wearing his compression stockings as advised though he could not read the moderate compression and has been wearing light compression. Review of his electronic medical  records note that he had lower extremity arterial duplex examination done on 06/23/2016 which showed no  hemodynamically significant stenosis in the bilateral lower extremity arterial system. He also had a lower extremity venous reflux examination done on 07/07/2016 and it was noted that he had venous incompetence in the right great saphenous vein and bilateral common femoral veins. Patient was seen by Dr. Tamala Julian on the same day and for some reason his notes do not reflect the venous studies or the arterial studies and he recommended patient do a venous duplex ultrasound to look for reflux and return to see him.he would also consider a lymph pump if required. The patient was told that his workup was normal and hence the patient canceled his follow-up appointment. 02/03/17 on evaluation today patient left medial lower extremity blister appears to be doing about the same. It is still continuing to drain and there's still the blistered skin covering the wound bed which is making it difficult for the alternate to do its job. Fortunately there is no evidence of cellulitis. No fevers chills noted. Patient states in general he is not having any significant discomfort. Patient's lower extremity arterial duplex exam revealed that patient was hemodynamically stable with no evidence of stenosis in regard to the bilateral lower extremities. The lower extremity venous reflux exam revealed the patient had venous incontinence noted in the right greater saphenous and bilateral common femoral vein. There is no evidence of deep or superficial vein thrombosis in the bilateral lower extremities. Readmission: 11/12/18 Patient presents for evaluation our clinic today concerning issues that he is having with his left lower extremity. He tells me that a couple weeks ago he began developing blisters on the left lower extremity along with increased swelling. He typically wears his compression stockings on a regular basis is  previously been evaluated both here as well is with vascular surgery they would recommend lymphedema pumps but unfortunately that somehow fell through and he never heard anything back from that. Nonetheless I think lymphedema pumps would be beneficial for this patient. He does have a history of hypertension and diabetes. Obviously the chronic venous stasis and lymphedema as well. At this point the blisters have been given in more trouble he states sometimes when the blisters openings able to clean it down with alcohol and it will dry out and do well. Unfortunately that has not been the case this time. He is having some discomfort although this mean these with cleaning the areas he doesn't have discomfort just on a regular basis. He has not been able to wear his compression stockings since the blisters arose due to the fact that of course it will drain into the socks causing additional issues and he didn't have any way to wrap this otherwise. He has increased to taking his Lasix every day instead of every other day. He sees his primary care provider later this month as well. No fevers, chills, nausea, or vomiting noted at this time. 11/19/18-Patient returns at 1 week, per intake RN the amount of seepage into the compression wraps was definitely improved, overall all the wounds are measuring smaller but continuing silver alginate to the wounds as primary dressing 11/26/18 on evaluation today patient appears to be doing quite well in regard to his left lower [place] ulcers. In fact of the areas that were noted initially he only has two regions still open. There is no evidence of active infection at this time. He still is not heard anything from the company regarding lymphedema pumps as of yet. Again as previously seen vascular they have not recommended any surgical intervention. [place],  [name] ([id number]) 12/03/2018 on evaluation today patient actually appears to be doing quite well with regard to his lower  extremity ulcers. In fact most of the areas appear to be healed the one spot which does not seem to be completely healed I am unsure of whether or not this is really draining that much but nonetheless there does not appear to be any signs of infection or significant drainage at this point. There is no sign of fever, chills, nausea, vomiting, or diarrhea. Overall I am pleased with how things have progressed I think is very close to being able to transition to his home compression stockings. 12/10/2018 upon evaluation today patient appears to be doing quite well with regard to his left lower extremity. He has been tolerating the dressing changes without complication. Fortunately there is no signs of active infection at this time. He appears after thorough evaluation of his leg to only have 1 small area that remains open at this point everything else appears to be almost completely closed. He still have significant swelling of the left lower extremity. We had discussed discussing this with his primary care provider he is not able to see her in person they were at the [facility] and right now the [place] is not seeing patients on site. According to the patient anyway. Subsequently he did speak with her apparently and his primary care provider feels that he may likely have a DVT. With that being said she has not seen his leg she is just going off of his history. Nonetheless that is a concern that the patient now has as well and while I do not feel the DVT is likely we can definitely ensure that that is not the case I will go ahead and see about putting that order in today. Nonetheless otherwise I am in a recommend that we continue with the current wound care measures including the compression therapy most likely. We just need to ensure that his leg is indeed free of any DVTs. 12/17/2018 on evaluation today patient actually appears to be completely healed today. He does have 2 very small areas of blistering  although this is not anything too significant at this point which is good news. With that being said I am in agreement with the fact that I think he is completely healed at this point. He does want to get back into his compression stocking. The good news is we have gotten approval from insurance for his lymphedema pumps we received a letter since last saw him last week. The other good news is his study did come back and showed no evidence of a DVT. 12/20/2018 on evaluation today patient presents for follow-up concerning his ongoing issues with his left lower extremity. He was actually discharged last Friday and did fairly well until he states blisters opened this morning. He tells me he has been wearing his compression stocking although he has a hard time getting this on. There does not appear to be any signs of active infection at this time. No fevers, chills, nausea, vomiting, or diarrhea. 12/27/2018 on evaluation today patient appears to be doing very well with regard to his swelling of the left lower extremity the 4 layer compression wrap seems to have been beneficial for him. Fortunately there is no signs of active infection at this time. Patient has been tolerating the compression wrap without complication and his foot swelling in particular appears to be greatly improved. He does still have a wound on the lateral portion  of his left leg I believe this is more of a blister that has now reopened. 01/03/2019 on evaluation today patient actually appears to be doing excellent in regard to his left lower extremity. He did receive his compression pumps and is actually use this 7 times since he was last here in the office. On top of the compression wrap he is now roughly 3 cm better at the calf and 2 cm better at the ankle he also states that his foot seem to go an issue better without even having to use a shoe horn. Obviously I think this is all evidence that he is doing excellent in this regard. The  other good news is he does not appear to have anything open today as far as wounds are concerned. 01/15/2019 on evaluation today patient appears to be doing more poorly yet again with regard to his left lower extremity. He has developed new wounds again after being discharged just recently. Unfortunately this continues to be the case that he will heal and then have subsequent new wounds. The last time I was hopeful that he may not end up coming back too quickly especially since he states he has been using his lymphedema pumps along with wearing his compression. Nonetheless he had a blister on the back of his leg that popped up on the left and this has opened up into an ulceration it is quite painful. 01/22/19 on evaluation today patient actually appears to be doing well with regard to his wound on the left lower extremity. He's been tolerating the dressing changes without complication including the compression wrap in the wound appears to be significantly smaller today which is great news. Overall very pleased in this regard. 01/29/2019 on evaluation today patient appears to be doing well with regard to his left posterior lower extremity ulcer. He has been tolerating the dressing changes without complication. This is not completely healed but is getting much closer. We did order a Farrow wrap 4000 for him he has received this and has it with him today although I am not sure we are quite ready to start him on that as of yet. We are very close. 02/05/2019 on evaluation today patient actually appears to be doing quite well with regard to his left posterior lower extremity ulcer. He still has a very tiny opening remaining but the fortunate thing is he seems to be healing quite nicely. He also did get his F[place] wrap which I am hoping will help with his edema control as well at home. Fortunately there is no evidence of active infection. 02/12/2019 patient and fortunately appears to be doing poorly in regard to  his wounds of the left lower extremity. He was very close to healing therefore we attempted to use his Velcro compression wraps continuing with lymphedema pumps at home. Unfortunately that does not seem to have done very well for him. He tells me that he wore them all the time but again I am not sure why if that is the case that he is having such significant edema. He is still on his fluid pills as well. With that being said there is no obvious sign of infection although I do wonder about the possibility of infection at this time as well. 02/19/2019 unfortunately upon evaluation today patient appears to be doing more poorly with regard to his left lower extremity. He is not showing signs of significant improvement and I think the biggest issue here is that he does have an infection  that appears to likely be Pseudomonas. That is based on the blue-green drainage that were noted at this time. Unfortunately the antibiotic that has been on is not going to take care of this at all. I think they will get a need to switch him to either Levaquin or Cipro and this was discussed with the patient. 02/26/2019 on evaluation today patient's lower extremity on the left appears to be doing significantly better as compared to last evaluation. Fortunately there is no signs of active infection at this time. He has been tolerating the compression wrap without complication in fact he made it the whole week at this point. He is showing signs of excellent improvement I am very happy in this regard. With that being said he is having some issues with infection we did review the results of his culture which I noted today. He did have a positive finding for Enterobacter as well as Alcaligenes faecalis. Fortunately the Levaquin that I placed him on will work for both which is great news. There is no signs of systemic infection at this point. 10/30; left posterior leg wound in the setting of very significant edema and what looks  like chronic venous inflammation. He has compression pumps but does not use them. We have been using 3 layer compression. Silver alginate to the wound as the primary dressing 03/18/2019 on evaluation today patient appears to be doing a little better compared to last time I saw him. He really has not been using his compression pumps he tells me that he is having too much discomfort. He has been keeping his wraps on however. He is only been taking his fluid pills every other day because he states they are not really helping and he has an appointment with his primary care provider at the [facility] tomorrow. Subsequently the wound itself on the left lower extremity does seem to be greatly improved compared to previous. JONEL, WELDON ([id number]) 03/25/2019 on evaluation today patient appears to be doing better with regard to his wounds on the bilateral lower extremities. The left is doing excellent the right is also doing better although both still do show some signs of open wounds noted at this point unfortunately. Fortunately there is no signs of active infection at this time. The patient also is not really having any significant pain which is good news. Unfortunately there was some confusion with the referral on vascular disease and as far as getting the patient scheduled there can be contacting him later today to do this fortunately we got this straightened out. 04/01/2019 on evaluation today patient appears to be doing no fevers, chills, nausea, vomiting, or diarrhea. Excellent at this time with regard to his lower extremities. There does not appear to be any open wound at this point which is good news. Fortunately is also no signs of active infection at this time. Overall feel like the patient has done excellent with the compression the problem is every time we got him to this point and then subsequently go to using his own compression things just go right back to where they were. I am not sure how to  address this we can try to get an appointment with vascular for 2 weeks now they have yet to call him. Obviously this has become frustrating for the patient as well. I think the issue has just been an honest error as far as scheduling is concerned but nonetheless still worn out the point where I am unsure of which direction we should  take. 04/08/2019 on evaluation today patient actually appears to be doing well with regard to his lower extremities. There are no open wounds at this time and things seem to be managing quite nicely as far as the overall edema control is concerned. With that being said he does have his compression socks today for [place] to go ahead and reinitiate therapy in that manner at this point. He is going to be going for shoes to be measured on Wednesday and then coincidentally he will also be seeing vascular on Thursday. Overall I think this is good news and again I am hopeful that they will be able to do something for him to help prevent ongoing issues with edema control as well. No fevers, chills, nausea, vomiting, or diarrhea. 04/11/2019 on evaluation today patient actually appears to be doing poorly after just being discharged on Monday of this week. He had been experiencing issues with again blisters especially on the left lower extremity. With that being said he was completely healed and appeared to be doing great this past Monday. He then subsequently has new blisters that formed before his appointment with vascular this morning. He was also measured for shoes in the interim. With that being said we may have figured out what exactly is going on and why he continues to have issues like what we are seeing at this point. He takes his compression stockings off at nighttime and then he ends up having to sleep in his chair for 5-6 hours a night. He sleeps with his feet down he cannot really get him up in the recliner and therefore he is sleeping and the worst possible his position  with his feet on the floor for that majority of the time. Again as I explained to him that is about one third at minimum at least one fourth of his day that he spending with his feet dangling down on the ground and the worst possible position they could be. I think this may be what is causing the issue. Subsequently I am leaning toward thinking that he may need a hospital bed in order to elevate his legs. We likely can have to coordinate this with his primary care provider at the [facility]. Readmission: 01/26/2021 this is a patient who presents for repeat evaluation here in the clinic although it is actually been couple of years since have seen him in fact it was December 2020 when I last saw him. Subsequently he never really healed but did end up being lost to follow-up. He tells me has been having issues ongoing with his lower extremities has bilateral lower extremity lymphedema no real significant or definitive open wounds but in general his lymphedema is way out of control. We were never able to refer him to lymphedema clinic simply due to the fact to be honest we were never able to get him completely healed. I do not see anyone with open wounds. The patient does have evidence of type 2 diabetes mellitus, lymphedema, chronic venous insufficiency, and hypertension. That really has not changed since his last evaluation. 02/09/2021 upon evaluation today patient appears to be doing a little better in regard to his legs although he still having a tremendous amount of drainage especially on the left leg. Fortunately there does not appear to be any evidence of active infection. Of note when we looked into this further it appears that the patient did not have any absorptive dressing on it was just the 4-layer compression wrap. Nonetheless this is  probably big part of the issue here. 10/10; he comes in today with 3 large areas on the upper right lower leg likely remanence of denuded blistering under his  compression wraps. He has no other wounds on the right. On the left he has the denuded area on the left medial foot and ankle and on the left dorsal foot. Massive lymphedema in both feet dorsally. Using Zetuvit under compression We have increased home health visitation to twice a week to change the dressings and will change it once 02/22/2021 upon evaluation today patient appears to be doing well currently with regard to his wounds. He has been tolerating the dressing changes without complication. Fortunately there does not appear to be any evidence of active infection which is great news. No fevers, chills, nausea, vomiting, or diarrhea. The biggest issue I see currently is that home health is not putting any medicine on the actual wounds before wrapping. 03/01/2021 upon evaluation today the patient's right leg actually appears to be doing quite well which is great news there does not appear to be any evidence of active infection at this time. No fevers, chills, nausea, vomiting, or diarrhea. With that being said the patient is having issues on the left foot where he is having significant drainage is also an ammonia smell he does not have any animals at home and this makes me concerned about a bacteria producing urea as a byproduct. Again the possible common organisms will be E. coli, Proteus, and Enterococcus. All 3 of which can be successfully treated with Levaquin. For that reason I think that this may be a good option for [place] to consider placing him on and I did obtain a culture as well for confirmation sake. 03/08/2021 upon evaluation today patient appears to be doing unfortunately still somewhat poorly in regard to his leg ulcerations. He actually has an area on the right leg where he blistered due to the fact that his wrap slid down and caused an area of pinching on his skin and this has led to a significant issue here. [place], [name] ([id number]) Objective Constitutional Well-nourished and  well-hydrated in no acute distress. Vitals Time Taken: 10:18 AM, Height: 73 in, Weight: 312 lbs, BMI: 41.2, Temperature: 97.8 F, Pulse: 65 bpm, Respiratory Rate: 18 breaths/min, Blood Pressure: 123/72 mmHg. Respiratory normal breathing without difficulty. Psychiatric this patient is able to make decisions and demonstrates good insight into disease process. Alert and Oriented x 3. pleasant and cooperative. General Notes: Upon inspection patient's leg still showed signs of significant edema and again he had some blistering on the right leg where there was an area where the wrap slipped up on him. Again I think that we are still struggling to get this under control. He did not have anything absorptive over top of the Sorbact over the feet either which is also of concern. Integumentary (Hair, Skin) Wound #15 status is Open. Original cause of wound was Blister. The date acquired was: 03/08/2021. The wound is located on the Right,Medial Lower Leg. The wound measures 2cm length x 5cm width x 0.5cm depth; 7.854cm^2 area and 3.927cm^3 volume. There is Fat Layer (Subcutaneous Tissue) exposed. There is no tunneling or undermining noted. There is a large amount of serous drainage noted. There is large (67-100%) granulation within the wound bed. There is no necrotic tissue within the wound bed. Other Condition(s) Patient presents with Lymphedema located on the Left Leg. 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 There was a Selective/Open Wound Skin/Epidermis Debridement with a total area of 10 sq cm performed by Tommie Sams., PA-C. With the following instrument(s): gauze to remove Non-Viable tissue/material. Material removed includes Skin: Dermis and Skin: Epidermis and. No specimens were taken. A time out was conducted at 10:45, prior to the start of the procedure. There was no bleeding. The procedure was tolerated well. Post Debridement Measurements: 2cm length x 5cm width x 0.5cm depth; 3.927cm^3 volume. Character of Wound/Ulcer Post Debridement is stable. Post procedure Diagnosis Wound #15: Same as Pre-Procedure Plan Follow-up Appointments: Return Appointment in 1 week. Home Health: [facility] for wound care. May utilize formulary equivalent dressing for wound treatment orders unless otherwise specified. Home Health Nurse may visit PRN to address patient s wound care needs. - frequency 3 times per week - patient will be seen once at wound center [name], [name] ([id number]) home health to see patient 2 times per week on Wed and friday Bathing/ Shower/ Hygiene: May shower with wound dressing protected with water repellent cover or cast protector. Edema Control - Lymphedema / Segmental Compressive Device / Other: Optional: One layer of unna paste to top of compression wrap (to act as an anchor). - Unna paste on feet and calf to secure wrap in place 4 Layer Compression System Lymphedema. - bi lat, apply sorbact to toe area, apply zetuvits/ or formulary for extra absorbant dressing around ankles and foot area, 3 times per week Elevate, Exercise Daily and Avoid Standing for Long Periods of Time. Elevate legs to the level of the heart and pump ankles as often as possible Elevate leg(s) parallel to the floor when sitting. Compression Pump: Use compression pump on left lower extremity for 60 minutes, twice daily. - Start today Compression Pump: Use compression pump  on right lower extremity for 60 minutes, twice daily. - Start today DO YOUR BEST to sleep in the bed at night. DO NOT sleep in your recliner. Long hours of sitting in a recliner leads to swelling of the legs and/or potential wounds on your backside. Other: - Contact prescriber regarding use of diuretics to reduce fluid overload. Off-Loading: Open toe surgical shoe Consults ordered were: Other Physician Referral - PCP for fluid pill to decrease fluid. General Notes: Generic dressings may be substituted for name brands, if necessary. WOUND #15: - Lower Leg Wound Laterality: Right, Medial Primary Dressing: Silvercel 4 1/4x 4 1/4 (in/in) (Generic) Discharge Instructions: Apply Silvercel 4 1/4x 4 1/4 (in/in) as instructed Secondary Dressing: Zetuvit Plus Silicone Non-bordered 5x5 (in/in) Compression Wrap: Medichoice 4 layer Compression System, 35-40 mmHG (Generic) Discharge Instructions: Apply multi-layer wrap as directed. 1. Would recommend currently that we actually continue Sorbact to the feet although he needs to have something absorptive over top of this and that was discussed with the patient today. 2. I am also can recommend that we continue to use the 4-layer compression wrap although I think that we need to try and utilize a Unna boot paste at the foot as well as the top of the wrap try to keep this from sliding down. 3.  I did review the patient's culture it revealed multiple organisms nothing predominant there is really nothing here that I can do to improve things. He is already on the Levaquin we will see how that does. We will see patient back for reevaluation in 1 week here in the clinic. If anything worsens or changes patient will contact our office for additional recommendations. Electronic Signature(s) Signed: 03/08/2021 2:47:51 PM By: Worthy Keeler PA-C Entered By: Worthy Keeler on 03/08/2021 14:47:51 [name]  ([id number]) -------------------------------------------------------------------------------- SuperBill Details Patient Name: [name] Date of Service: 03/08/2021 Medical Record Number: [id number] Patient Account Number: [id number] Date of Birth/Sex: 1951-03-14 (70 y.o. M) Treating RN: Cornell Barman Primary Care Provider: Felipa Eth Other Clinician: Referring Provider: Felipa Eth Treating Provider/Extender: Jeri Cos Weeks in Treatment: 5 Diagnosis Coding ICD-10 Codes Code Description [phone] Type 2 diabetes mellitus with other skin ulcer I89.0 Lymphedema, not elsewhere classified I87.2 Venous insufficiency (chronic) (peripheral) L97.822 Non-pressure chronic ulcer of other part of left lower leg with fat layer exposed L97.512 Non-pressure chronic ulcer of other part of right foot with fat layer exposed I10 Essential (primary) hypertension L97.811 Non-pressure chronic ulcer of other part of right lower leg limited to breakdown of skin Facility Procedures CPT4 Code: 39030092 Description: 97597 - DEBRIDE WOUND 1ST 20 SQ CM OR < Modifier: Quantity: 1 CPT4 Code: Description: ICD-10 Diagnosis Description L97.811 Non-pressure chronic ulcer of other part of right lower leg limited to brea Modifier: kdown of skin Quantity: Physician Procedures CPT4 Code: 3300762 Description: 99214 - WC PHYS LEVEL 4 - EST PT Modifier: 25 Quantity: 1 CPT4 Code: Description: ICD-10 Diagnosis Description E11.622 Type 2 diabetes mellitus with other skin ulcer I89.0 Lymphedema, not elsewhere classified I87.2 Venous insufficiency (chronic) (peripheral) L97.822 Non-pressure chronic ulcer of other part of left lower  leg with fat layer e Modifier: xposed Quantity: CPT4 Code: [id number] Description: 56256 - WC PHYS DEBR WO ANESTH 20 SQ CM Modifier: Quantity: 1 CPT4 Code: Description: ICD-10 Diagnosis Description L97.811 Non-pressure chronic ulcer of other part of right lower leg limited to  brea Modifier: kdown of skin Quantity: Electronic Signature(s) Signed: 03/08/2021 2:48:54 PM By: Worthy Keeler PA-C Entered By: Worthy Keeler on 03/08/2021 14:48:53

## 2021-03-15 ENCOUNTER — Encounter: Payer: No Typology Code available for payment source | Attending: Physician Assistant | Admitting: Physician Assistant

## 2021-03-15 ENCOUNTER — Other Ambulatory Visit: Payer: Self-pay

## 2021-03-15 DIAGNOSIS — I10 Essential (primary) hypertension: Secondary | ICD-10-CM | POA: Diagnosis not present

## 2021-03-15 DIAGNOSIS — E11622 Type 2 diabetes mellitus with other skin ulcer: Secondary | ICD-10-CM | POA: Diagnosis not present

## 2021-03-15 DIAGNOSIS — L97811 Non-pressure chronic ulcer of other part of right lower leg limited to breakdown of skin: Secondary | ICD-10-CM | POA: Diagnosis not present

## 2021-03-15 DIAGNOSIS — I890 Lymphedema, not elsewhere classified: Secondary | ICD-10-CM | POA: Diagnosis not present

## 2021-03-15 DIAGNOSIS — I872 Venous insufficiency (chronic) (peripheral): Secondary | ICD-10-CM | POA: Insufficient documentation

## 2021-03-15 DIAGNOSIS — L97822 Non-pressure chronic ulcer of other part of left lower leg with fat layer exposed: Secondary | ICD-10-CM | POA: Diagnosis not present

## 2021-03-15 DIAGNOSIS — L97512 Non-pressure chronic ulcer of other part of right foot with fat layer exposed: Secondary | ICD-10-CM | POA: Insufficient documentation

## 2021-03-15 NOTE — Progress Notes (Addendum)
JAHLEEL, STROSCHEIN ([id number]) Visit Report for 03/15/2021 Chief Complaint Document Details Patient Name: [name], Mooney Date of Service: 03/15/2021 10:15 AM Medical Record Number: [id number] Patient Account Number: [id number] Date of Birth/Sex: January 23, 1951 (70 y.o. M) Treating RN: [name] Primary Care Provider: Felipa Eth Other Clinician: Referring Provider: Felipa Eth Treating Provider/Extender: Skipper Cliche in Treatment: 6 Information Obtained from: Patient Chief Complaint Left LE ulcers Electronic Signature(s) Signed: 03/15/2021 10:19:05 AM By: Worthy Keeler PA-C Entered By: Worthy Keeler on 03/15/2021 10:19:05 [name] ([id number]) -------------------------------------------------------------------------------- HPI Details Patient Name: [name] Date of Service: 03/15/2021 10:15 AM Medical Record Number: [id number] Patient Account Number: [id number] Date of Birth/Sex: 08/01/1950 (70 y.o. M) Treating RN: [name] Primary Care Provider: Felipa Eth Other Clinician: Referring Provider: Felipa Eth Treating Provider/Extender: Skipper Cliche in Treatment: 6 History of Present Illness HPI Description: 70 year old male who presented to the ER with bilateral lower extremity blisters which had started last week. he has a past medical history of leukemia, diabetes mellitus, hypertension, edema of both lower extremities, his recurrent skin infections, peripheral vascular disease, coronary artery disease, congestive heart failure and peripheral neuropathy. in the ER he was given Rocephin and put on Silvadene cream. he was put on oral doxycycline and was asked to follow-up with the [facility]. His last hemoglobin A1c was 6.6 in December and he checks his blood sugar once a week. He does not have any physicians outside the [place] system. He does not recall any vascular duplex studies done either for arterial or venous disease but was told to wear  compression stockings which he does not use 05/30/2016 -- we have not yet received any of his notes from the [facility] hospital system and his arterial and venous duplex studies are scheduled here in [place] around mid February. We are unable to have his insurance accepted by home health agencies and hence he is getting dressings only once a week. 06/06/16 -- -- I received a call from the patient's PCP at the [facility] at [facility] and spoke to Dr. Garvin Fila, phone number [phone] and fax number [phone]. She confirmed that no vascular testing was done over the last 5 years and she would be happy to do them if the patient did want them to be done at the [place] and we could fax him a request. Readmission: 69 year old male seen by as in February of this year and was referred to vein and vascular for studies and opinion from the vascular surgeons. The patient returns today with a fresh problem having had blisters on his left lower extremity which have been there for about 5 days and he clearly states that he has been wearing his compression stockings as advised though he could not read the moderate compression and has been wearing light compression. Review of his electronic medical records note that he had lower extremity arterial duplex examination done on 06/23/2016 which showed no hemodynamically significant stenosis in the bilateral lower extremity arterial system. He also had a lower extremity venous reflux examination done on 07/07/2016 and it was noted that he had venous incompetence in the right great saphenous vein and bilateral common femoral veins. Patient was seen by Dr. Tamala Julian on the same day and for some reason his notes do not reflect the venous studies or the arterial studies and he recommended patient do a venous duplex ultrasound to look for reflux and return to see him.he would also consider a lymph pump if required. The patient was told that his workup  was normal and hence the patient  canceled his follow-up appointment. 02/03/17 on evaluation today patient left medial lower extremity blister appears to be doing about the same. It is still continuing to drain and there's still the blistered skin covering the wound bed which is making it difficult for the alternate to do its job. Fortunately there is no evidence of cellulitis. No fevers chills noted. Patient states in general he is not having any significant discomfort. Patient's lower extremity arterial duplex exam revealed that patient was hemodynamically stable with no evidence of stenosis in regard to the bilateral lower extremities. The lower extremity venous reflux exam revealed the patient had venous incontinence noted in the right greater saphenous and bilateral common femoral vein. There is no evidence of deep or superficial vein thrombosis in the bilateral lower extremities. Readmission: 11/12/18 Patient presents for evaluation our clinic today concerning issues that he is having with his left lower extremity. He tells me that a couple weeks ago he began developing blisters on the left lower extremity along with increased swelling. He typically wears his compression stockings on a regular basis is previously been evaluated both here as well is with vascular surgery they would recommend lymphedema pumps but unfortunately that somehow fell through and he never heard anything back from that. Nonetheless I think lymphedema pumps would be beneficial for this patient. He does have a history of hypertension and diabetes. Obviously the chronic venous stasis and lymphedema as well. At this point the blisters have been given in more trouble he states sometimes when the blisters openings able to clean it down with alcohol and it will dry out and do well. Unfortunately that has not been the case this time. He is having some discomfort although this mean these with cleaning the areas he doesn't have discomfort just on a regular basis. He  has not been able to wear his compression stockings since the blisters arose due to the fact that of course it will drain into the socks causing additional issues and he didn't have any way to wrap this otherwise. He has increased to taking his Lasix every day instead of every other day. He sees his primary care provider later this month as well. No fevers, chills, nausea, or vomiting noted at this time. 11/19/18-Patient returns at 1 week, per intake RN the amount of seepage into the compression wraps was definitely improved, overall all the wounds are measuring smaller but continuing silver alginate to the wounds as primary dressing 11/26/18 on evaluation today patient appears to be doing quite well in regard to his left lower [place] ulcers. In fact of the areas that were noted initially he only has two regions still open. There is no evidence of active infection at this time. He still is not heard anything from the company regarding lymphedema pumps as of yet. Again as previously seen vascular they have not recommended any surgical intervention. 12/03/2018 on evaluation today patient actually appears to be doing quite well with regard to his lower extremity ulcers. In fact most of the areas appear to be healed the one spot which does not seem to be completely healed I am unsure of whether or not this is really draining that much but nonetheless there does not appear to be any signs of infection or significant drainage at this point. There is no sign of fever, chills, nausea, vomiting, or diarrhea. Overall I am pleased with how things have progressed I think is very close to being able to transition  to his home compression stockings. FIRAS, GUARDADO ([id number]) 12/10/2018 upon evaluation today patient appears to be doing quite well with regard to his left lower extremity. He has been tolerating the dressing changes without complication. Fortunately there is no signs of active infection at this time. He  appears after thorough evaluation of his leg to only have 1 small area that remains open at this point everything else appears to be almost completely closed. He still have significant swelling of the left lower extremity. We had discussed discussing this with his primary care provider he is not able to see her in person they were at the [facility] and right now the [place] is not seeing patients on site. According to the patient anyway. Subsequently he did speak with her apparently and his primary care provider feels that he may likely have a DVT. With that being said she has not seen his leg she is just going off of his history. Nonetheless that is a concern that the patient now has as well and while I do not feel the DVT is likely we can definitely ensure that that is not the case I will go ahead and see about putting that order in today. Nonetheless otherwise I am in a recommend that we continue with the current wound care measures including the compression therapy most likely. We just need to ensure that his leg is indeed free of any DVTs. 12/17/2018 on evaluation today patient actually appears to be completely healed today. He does have 2 very small areas of blistering although this is not anything too significant at this point which is good news. With that being said I am in agreement with the fact that I think he is completely healed at this point. He does want to get back into his compression stocking. The good news is we have gotten approval from insurance for his lymphedema pumps we received a letter since last saw him last week. The other good news is his study did come back and showed no evidence of a DVT. 12/20/2018 on evaluation today patient presents for follow-up concerning his ongoing issues with his left lower extremity. He was actually discharged last Friday and did fairly well until he states blisters opened this morning. He tells me he has been wearing his compression stocking although  he has a hard time getting this on. There does not appear to be any signs of active infection at this time. No fevers, chills, nausea, vomiting, or diarrhea. 12/27/2018 on evaluation today patient appears to be doing very well with regard to his swelling of the left lower extremity the 4 layer compression wrap seems to have been beneficial for him. Fortunately there is no signs of active infection at this time. Patient has been tolerating the compression wrap without complication and his foot swelling in particular appears to be greatly improved. He does still have a wound on the lateral portion of his left leg I believe this is more of a blister that has now reopened. 01/03/2019 on evaluation today patient actually appears to be doing excellent in regard to his left lower extremity. He did receive his compression pumps and is actually use this 7 times since he was last here in the office. On top of the compression wrap he is now roughly 3 cm better at the calf and 2 cm better at the ankle he also states that his foot seem to go an issue better without even having to use a shoe horn. Obviously I  think this is all evidence that he is doing excellent in this regard. The other good news is he does not appear to have anything open today as far as wounds are concerned. 01/15/2019 on evaluation today patient appears to be doing more poorly yet again with regard to his left lower extremity. He has developed new wounds again after being discharged just recently. Unfortunately this continues to be the case that he will heal and then have subsequent new wounds. The last time I was hopeful that he may not end up coming back too quickly especially since he states he has been using his lymphedema pumps along with wearing his compression. Nonetheless he had a blister on the back of his leg that popped up on the left and this has opened up into an ulceration it is quite painful. 01/22/19 on evaluation today patient  actually appears to be doing well with regard to his wound on the left lower extremity. He's been tolerating the dressing changes without complication including the compression wrap in the wound appears to be significantly smaller today which is great news. Overall very pleased in this regard. 01/29/2019 on evaluation today patient appears to be doing well with regard to his left posterior lower extremity ulcer. He has been tolerating the dressing changes without complication. This is not completely healed but is getting much closer. We did order a Farrow wrap 4000 for him he has received this and has it with him today although I am not sure we are quite ready to start him on that as of yet. We are very close. 02/05/2019 on evaluation today patient actually appears to be doing quite well with regard to his left posterior lower extremity ulcer. He still has a very tiny opening remaining but the fortunate thing is he seems to be healing quite nicely. He also did get his Farrow wrap which I am hoping will help with his edema control as well at home. Fortunately there is no evidence of active infection. 02/12/2019 patient and fortunately appears to be doing poorly in regard to his wounds of the left lower extremity. He was very close to healing therefore we attempted to use his Velcro compression wraps continuing with lymphedema pumps at home. Unfortunately that does not seem to have done very well for him. He tells me that he wore them all the time but again I am not sure why if that is the case that he is having such significant edema. He is still on his fluid pills as well. With that being said there is no obvious sign of infection although I do wonder about the possibility of infection at this time as well. 02/19/2019 unfortunately upon evaluation today patient appears to be doing more poorly with regard to his left lower extremity. He is not showing signs of significant improvement and I think the  biggest issue here is that he does have an infection that appears to likely be Pseudomonas. That is based on the blue-green drainage that were noted at this time. Unfortunately the antibiotic that has been on is not going to take care of this at all. I think they will get a need to switch him to either Levaquin or Cipro and this was discussed with the patient. 02/26/2019 on evaluation today patient's lower extremity on the left appears to be doing significantly better as compared to last evaluation. Fortunately there is no signs of active infection at this time. He has been tolerating the compression wrap without complication in  fact he made it the whole week at this point. He is showing signs of excellent improvement I am very happy in this regard. With that being said he is having some issues with infection we did review the results of his culture which I noted today. He did have a positive finding for Enterobacter as well as Alcaligenes faecalis. Fortunately the Levaquin that I placed him on will work for both which is great news. There is no signs of systemic infection at this point. 10/30; left posterior leg wound in the setting of very significant edema and what looks like chronic venous inflammation. He has compression pumps but does not use them. We have been using 3 layer compression. Silver alginate to the wound as the primary dressing 03/18/2019 on evaluation today patient appears to be doing a little better compared to last time I saw him. He really has not been using his compression pumps he tells me that he is having too much discomfort. He has been keeping his wraps on however. He is only been taking his fluid pills every other day because he states they are not really helping and he has an appointment with his primary care provider at the [facility] tomorrow. Subsequently the wound itself on the left lower extremity does seem to be greatly improved compared to previous. 03/25/2019 on evaluation  today patient appears to be doing better with regard to his wounds on the bilateral lower extremities. The left is doing excellent the right is also doing better although both still do show some signs of open wounds noted at this point unfortunately. Fortunately there is no signs of active infection at this time. The patient also is not really having any significant pain which is good news. Unfortunately there was some confusion with the referral on vascular disease and as far as getting the patient scheduled there can be contacting him later today to do this MONTOYA, BRANDEL ([id number]) fortunately we got this straightened out. 04/01/2019 on evaluation today patient appears to be doing no fevers, chills, nausea, vomiting, or diarrhea. Excellent at this time with regard to his lower extremities. There does not appear to be any open wound at this point which is good news. Fortunately is also no signs of active infection at this time. Overall feel like the patient has done excellent with the compression the problem is every time we got him to this point and then subsequently go to using his own compression things just go right back to where they were. I am not sure how to address this we can try to get an appointment with vascular for 2 weeks now they have yet to call him. Obviously this has become frustrating for the patient as well. I think the issue has just been an honest error as far as scheduling is concerned but nonetheless still worn out the point where I am unsure of which direction we should take. 04/08/2019 on evaluation today patient actually appears to be doing well with regard to his lower extremities. There are no open wounds at this time and things seem to be managing quite nicely as far as the overall edema control is concerned. With that being said he does have his compression socks today for [place] to go ahead and reinitiate therapy in that manner at this point. He is going to be going for  shoes to be measured on Wednesday and then coincidentally he will also be seeing vascular on Thursday. Overall I think this is good news and  again I am hopeful that they will be able to do something for him to help prevent ongoing issues with edema control as well. No fevers, chills, nausea, vomiting, or diarrhea. 04/11/2019 on evaluation today patient actually appears to be doing poorly after just being discharged on Monday of this week. He had been experiencing issues with again blisters especially on the left lower extremity. With that being said he was completely healed and appeared to be doing great this past Monday. He then subsequently has new blisters that formed before his appointment with vascular this morning. He was also measured for shoes in the interim. With that being said we may have figured out what exactly is going on and why he continues to have issues like what we are seeing at this point. He takes his compression stockings off at nighttime and then he ends up having to sleep in his chair for 5-6 hours a night. He sleeps with his feet down he cannot really get him up in the recliner and therefore he is sleeping and the worst possible his position with his feet on the floor for that majority of the time. Again as I explained to him that is about one third at minimum at least one fourth of his day that he spending with his feet dangling down on the ground and the worst possible position they could be. I think this may be what is causing the issue. Subsequently I am leaning toward thinking that he may need a hospital bed in order to elevate his legs. We likely can have to coordinate this with his primary care provider at the [facility]. Readmission: 01/26/2021 this is a patient who presents for repeat evaluation here in the clinic although it is actually been couple of years since have seen him in fact it was December 2020 when I last saw him. Subsequently he never really healed but  did end up being lost to follow-up. He tells me has been having issues ongoing with his lower extremities has bilateral lower extremity lymphedema no real significant or definitive open wounds but in general his lymphedema is way out of control. We were never able to refer him to lymphedema clinic simply due to the fact to be honest we were never able to get him completely healed. I do not see anyone with open wounds. The patient does have evidence of type 2 diabetes mellitus, lymphedema, chronic venous insufficiency, and hypertension. That really has not changed since his last evaluation. 02/09/2021 upon evaluation today patient appears to be doing a little better in regard to his legs although he still having a tremendous amount of drainage especially on the left leg. Fortunately there does not appear to be any evidence of active infection. Of note when we looked into this further it appears that the patient did not have any absorptive dressing on it was just the 4-layer compression wrap. Nonetheless this is probably big part of the issue here. 10/10; he comes in today with 3 large areas on the upper right lower leg likely remanence of denuded blistering under his compression wraps. He has no other wounds on the right. On the left he has the denuded area on the left medial foot and ankle and on the left dorsal foot. Massive lymphedema in both feet dorsally. Using Zetuvit under compression We have increased home health visitation to twice a week to change the dressings and will change it once 02/22/2021 upon evaluation today patient appears to be doing well currently  with regard to his wounds. He has been tolerating the dressing changes without complication. Fortunately there does not appear to be any evidence of active infection which is great news. No fevers, chills, nausea, vomiting, or diarrhea. The biggest issue I see currently is that home health is not putting any medicine on the actual wounds  before wrapping. 03/01/2021 upon evaluation today the patient's right leg actually appears to be doing quite well which is great news there does not appear to be any evidence of active infection at this time. No fevers, chills, nausea, vomiting, or diarrhea. With that being said the patient is having issues on the left foot where he is having significant drainage is also an ammonia smell he does not have any animals at home and this makes me concerned about a bacteria producing urea as a byproduct. Again the possible common organisms will be E. coli, Proteus, and Enterococcus. All 3 of which can be successfully treated with Levaquin. For that reason I think that this may be a good option for [place] to consider placing him on and I did obtain a culture as well for confirmation sake. 03/08/2021 upon evaluation today patient appears to be doing unfortunately still somewhat poorly in regard to his leg ulcerations. He actually has an area on the right leg where he blistered due to the fact that his wrap slid down and caused an area of pinching on his skin and this has led to a significant issue here. 03/15/2021 upon evaluation today patient unfortunately has not been wrapped appropriately with absorptive dressings nor with the appropriate technique for the third layer of the 4-layer compression wrap. These are issues that we continue to try to address with the home health nurse. Also the absorptive dressing that she had was cut in half and therefore that causes things to leak out it does not actually trap the fluid in regard to the top of the foot overall I think that all these combined are really not seeing things improved significantly here. Fortunately there does not appear to be any signs of significant infection at this time which is good news. He still is having a tremendous amount of drainage. Electronic Signature(s) Signed: 03/15/2021 10:45:48 AM By: Worthy Keeler PA-C Entered By: Worthy Keeler on  03/15/2021 10:45:48 [name] ([id number]) -------------------------------------------------------------------------------- Physical Exam Details Patient Name: [name] Date of Service: 03/15/2021 10:15 AM Medical Record Number: [id number] Patient Account Number: [id number] Date of Birth/Sex: [birth year] (70 y.o. M) Treating RN: [name] Primary Care Provider: Felipa Eth Other Clinician: Referring Provider: Felipa Eth Treating Provider/Extender: Skipper Cliche in Treatment: 6 Constitutional Obese and well-hydrated in no acute distress. Respiratory normal breathing without difficulty. Psychiatric this patient is able to make decisions and demonstrates good insight into disease process. Alert and Oriented x 3. pleasant and cooperative. Notes Unfortunately patient continues to have significant drainage at this point. I do not see any signs of significant improvement unfortunately. I think that there is still a lot going on here as far as the drainage is concerned I am without something changing in that regard I am not certain he is going to get better. We need this to be wrapped appropriately with appropriate absorptive dressings as well. Electronic Signature(s) Signed: 03/15/2021 10:46:33 AM By: Worthy Keeler PA-C Entered By: Worthy Keeler on 03/15/2021 10:46:33 [name] ([id number]) -------------------------------------------------------------------------------- Physician Orders Details Patient Name: [name] Date of Service: 03/15/2021 10:15 AM Medical Record Number: [id number] Patient Account Number: [id number] Date  of Birth/Sex: Jul 17, 1950 (70 y.o. M) Treating RN: [name] Primary Care Provider: Felipa Eth Other Clinician: Referring Provider: Felipa Eth Treating Provider/Extender: Skipper Cliche in Treatment: 6 Verbal / Phone Orders: No Diagnosis Coding ICD-10 Coding Code Description E11.622 Type 2 diabetes mellitus with  other skin ulcer I89.0 Lymphedema, not elsewhere classified I87.2 Venous insufficiency (chronic) (peripheral) L97.822 Non-pressure chronic ulcer of other part of left lower leg with fat layer exposed L97.512 Non-pressure chronic ulcer of other part of right foot with fat layer exposed I10 Essential (primary) hypertension L97.811 Non-pressure chronic ulcer of other part of right lower leg limited to breakdown of skin Follow-up Appointments o Return Appointment in 1 week. [place] for wound care. May utilize formulary equivalent dressing for wound treatment orders unless otherwise specified. Home Health Nurse may visit PR[facility] to address patientos wound care needs. - frequency 3 times per week - patient will be seen once at wound center - home health to see patient 2 times per week on Wed and friday Bathing/ Shower/ Hygiene o May shower with wound dressing protected with water repellent cover or cast protector. Edema Control - Lymphedema / Segmental Compressive Device / Other Bilateral Lower Extremities o Optional: One layer of unna paste to top of compression wrap (to act as an anchor). - Unna paste on feet and calf to secure wrap in place o 4 Layer Compression System Lymphedema. - bi lat, apply zetuvits/ or formulary for extra absorbant dressing around ankles and foot area, 3 times per week COTTON LAYER SPIRAL, LIGHT TAN SPIRAL. WHITE WITH YELLOW LINE FIGURE 8 , COBAN SPIRAL o Elevate, Exercise Daily and Avoid Standing for Long Periods of Time. o Elevate legs to the level of the heart and pump ankles as often as possible o Elevate leg(s) parallel to the floor when sitting. o Compression Pump: Use compression pump on left lower extremity for 60 minutes, twice daily. - Start today o Compression Pump: Use compression pump on right lower extremity for 60 minutes, twice daily. - Start today o DO YOUR BEST to sleep in the bed at night. DO NOT sleep in  your recliner. Long hours of sitting in a recliner leads to swelling of the legs and/or potential wounds on your backside. o Other: - Contact prescriber regarding use of diuretics to reduce fluid overload. Off-Loading Bilateral Lower Extremities o Open toe surgical shoe Wound Treatment Wound #15 - Lower Leg Wound Laterality: Right, Medial Primary Dressing: Silvercel 4 1/4x 4 1/4 (in/in) (Generic) 3 x Per Week/30 Days Discharge Instructions: Apply Silvercel 4 1/4x 4 1/4 (in/in) as instructed Secondary Dressing: Zetuvit Plus Silicone Non-bordered 5x5 (in/in) 3 x Per Week/30 Days Compression Wrap: Medichoice 4 layer Compression System, 35-40 mmHG (Generic) 3 x Per Week/30 Days Discharge Instructions: Apply multi-layer wrap as directed. SERGEY, ISHLER ([id number]) Electronic Signature(s) Signed: 03/15/2021 4:18:05 PM By: Worthy Keeler PA-C Signed: 03/19/2021 3:56:47 PM By: [name] RN Entered By: [name] on 03/15/2021 13:28:06 [name] ([id number]) -------------------------------------------------------------------------------- Problem List Details Patient Name: [name] Date of Service: 03/15/2021 10:15 AM Medical Record Number: [id number] Patient Account Number: [id number] Date of Birth/Sex: 1951-01-24 (70 y.o. M) Treating RN: [name] Primary Care Provider: Felipa Eth Other Clinician: Referring Provider: Felipa Eth Treating Provider/Extender: Skipper Cliche in Treatment: 6 Active Problems ICD-10 Encounter Code Description Active Date MDM Diagnosis E11.622 Type 2 diabetes mellitus with other skin ulcer 01/26/2021 No Yes I89.0 Lymphedema, not elsewhere classified 01/26/2021 No Yes I87.2 Venous insufficiency (chronic) (  peripheral) 01/26/2021 No Yes L97.822 Non-pressure chronic ulcer of other part of left lower leg with fat layer 01/26/2021 No Yes exposed L97.512 Non-pressure chronic ulcer of other part of right foot with fat layer 01/26/2021 No  Yes exposed I10 Essential (primary) hypertension 01/26/2021 No Yes L97.811 Non-pressure chronic ulcer of other part of right lower leg limited to 02/15/2021 No Yes breakdown of skin Inactive Problems Resolved Problems Electronic Signature(s) Signed: 03/15/2021 10:18:59 AM By: Worthy Keeler PA-C Entered By: Worthy Keeler on 03/15/2021 10:18:59 [name] ([id number]) -------------------------------------------------------------------------------- Progress Note Details Patient Name: [name] Date of Service: 03/15/2021 10:15 AM Medical Record Number: [id number] Patient Account Number: [id number] Date of Birth/Sex: Aug 02, 1950 (70 y.o. M) Treating RN: [name] Primary Care Provider: Felipa Eth Other Clinician: Referring Provider: Felipa Eth Treating Provider/Extender: Skipper Cliche in Treatment: 6 Subjective Chief Complaint Information obtained from Patient Left LE ulcers History of Present Illness (HPI) 70 year old male who presented to the ER with bilateral lower extremity blisters which had started last week. he has a past medical history of leukemia, diabetes mellitus, hypertension, edema of both lower extremities, his recurrent skin infections, peripheral vascular disease, coronary artery disease, congestive heart failure and peripheral neuropathy. in the ER he was given Rocephin and put on Silvadene cream. he was put on oral doxycycline and was asked to follow-up with the [facility]. His last hemoglobin A1c was 6.6 in December and he checks his blood sugar once a week. He does not have any physicians outside the [place] system. He does not recall any vascular duplex studies done either for arterial or venous disease but was told to wear compression stockings which he does not use 05/30/2016 -- we have not yet received any of his notes from the [facility] hospital system and his arterial and venous duplex studies are scheduled here in [place] around mid February.  We are unable to have his insurance accepted by home health agencies and hence he is getting dressings only once a week. 06/06/16 -- -- I received a call from the patient's PCP at the [facility] at [facility] and spoke to Dr. Garvin Fila, phone number [phone] and fax number [phone]. She confirmed that no vascular testing was done over the last 5 years and she would be happy to do them if the patient did want them to be done at the [place] and we could fax him a request. Readmission: 70 year old male seen by as in February of this year and was referred to vein and vascular for studies and opinion from the vascular surgeons. The patient returns today with a fresh problem having had blisters on his left lower extremity which have been there for about 5 days and he clearly states that he has been wearing his compression stockings as advised though he could not read the moderate compression and has been wearing light compression. Review of his electronic medical records note that he had lower extremity arterial duplex examination done on 06/23/2016 which showed no hemodynamically significant stenosis in the bilateral lower extremity arterial system. He also had a lower extremity venous reflux examination done on 07/07/2016 and it was noted that he had venous incompetence in the right great saphenous vein and bilateral common femoral veins. Patient was seen by Dr. Tamala Julian on the same day and for some reason his notes do not reflect the venous studies or the arterial studies and he recommended patient do a venous duplex ultrasound to look for reflux and return to see him.he would also consider  a lymph pump if required. The patient was told that his workup was normal and hence the patient canceled his follow-up appointment. 02/03/17 on evaluation today patient left medial lower extremity blister appears to be doing about the same. It is still continuing to drain and there's still the blistered skin covering the  wound bed which is making it difficult for the alternate to do its job. Fortunately there is no evidence of cellulitis. No fevers chills noted. Patient states in general he is not having any significant discomfort. Patient's lower extremity arterial duplex exam revealed that patient was hemodynamically stable with no evidence of stenosis in regard to the bilateral lower extremities. The lower extremity venous reflux exam revealed the patient had venous incontinence noted in the right greater saphenous and bilateral common femoral vein. There is no evidence of deep or superficial vein thrombosis in the bilateral lower extremities. Readmission: 11/12/18 Patient presents for evaluation our clinic today concerning issues that he is having with his left lower extremity. He tells me that a couple weeks ago he began developing blisters on the left lower extremity along with increased swelling. He typically wears his compression stockings on a regular basis is previously been evaluated both here as well is with vascular surgery they would recommend lymphedema pumps but unfortunately that somehow fell through and he never heard anything back from that. Nonetheless I think lymphedema pumps would be beneficial for this patient. He does have a history of hypertension and diabetes. Obviously the chronic venous stasis and lymphedema as well. At this point the blisters have been given in more trouble he states sometimes when the blisters openings able to clean it down with alcohol and it will dry out and do well. Unfortunately that has not been the case this time. He is having some discomfort although this mean these with cleaning the areas he doesn't have discomfort just on a regular basis. He has not been able to wear his compression stockings since the blisters arose due to the fact that of course it will drain into the socks causing additional issues and he didn't have any way to wrap this otherwise. He has  increased to taking his Lasix every day instead of every other day. He sees his primary care provider later this month as well. No fevers, chills, nausea, or vomiting noted at this time. 11/19/18-Patient returns at 1 week, per intake RN the amount of seepage into the compression wraps was definitely improved, overall all the wounds are measuring smaller but continuing silver alginate to the wounds as primary dressing 11/26/18 on evaluation today patient appears to be doing quite well in regard to his left lower [place] ulcers. In fact of the areas that were noted initially he only has two regions still open. There is no evidence of active infection at this time. He still is not heard anything from the company regarding lymphedema pumps as of yet. Again as previously seen vascular they have not recommended any surgical intervention. JONLUKE, COBBINS ([id number]) 12/03/2018 on evaluation today patient actually appears to be doing quite well with regard to his lower extremity ulcers. In fact most of the areas appear to be healed the one spot which does not seem to be completely healed I am unsure of whether or not this is really draining that much but nonetheless there does not appear to be any signs of infection or significant drainage at this point. There is no sign of fever, chills, nausea, vomiting, or diarrhea. Overall I am pleased  with how things have progressed I think is very close to being able to transition to his home compression stockings. 12/10/2018 upon evaluation today patient appears to be doing quite well with regard to his left lower extremity. He has been tolerating the dressing changes without complication. Fortunately there is no signs of active infection at this time. He appears after thorough evaluation of his leg to only have 1 small area that remains open at this point everything else appears to be almost completely closed. He still have significant swelling of the left lower extremity.  We had discussed discussing this with his primary care provider he is not able to see her in person they were at the [facility] and right now the [place] is not seeing patients on site. According to the patient anyway. Subsequently he did speak with her apparently and his primary care provider feels that he may likely have a DVT. With that being said she has not seen his leg she is just going off of his history. Nonetheless that is a concern that the patient now has as well and while I do not feel the DVT is likely we can definitely ensure that that is not the case I will go ahead and see about putting that order in today. Nonetheless otherwise I am in a recommend that we continue with the current wound care measures including the compression therapy most likely. We just need to ensure that his leg is indeed free of any DVTs. 12/17/2018 on evaluation today patient actually appears to be completely healed today. He does have 2 very small areas of blistering although this is not anything too significant at this point which is good news. With that being said I am in agreement with the fact that I think he is completely healed at this point. He does want to get back into his compression stocking. The good news is we have gotten approval from insurance for his lymphedema pumps we received a letter since last saw him last week. The other good news is his study did come back and showed no evidence of a DVT. 12/20/2018 on evaluation today patient presents for follow-up concerning his ongoing issues with his left lower extremity. He was actually discharged last Friday and did fairly well until he states blisters opened this morning. He tells me he has been wearing his compression stocking although he has a hard time getting this on. There does not appear to be any signs of active infection at this time. No fevers, chills, nausea, vomiting, or diarrhea. 12/27/2018 on evaluation today patient appears to be doing very  well with regard to his swelling of the left lower extremity the 4 layer compression wrap seems to have been beneficial for him. Fortunately there is no signs of active infection at this time. Patient has been tolerating the compression wrap without complication and his foot swelling in particular appears to be greatly improved. He does still have a wound on the lateral portion of his left leg I believe this is more of a blister that has now reopened. 01/03/2019 on evaluation today patient actually appears to be doing excellent in regard to his left lower extremity. He did receive his compression pumps and is actually use this 7 times since he was last here in the office. On top of the compression wrap he is now roughly 3 cm better at the calf and 2 cm better at the ankle he also states that his foot seem to go an  issue better without even having to use a shoe horn. Obviously I think this is all evidence that he is doing excellent in this regard. The other good news is he does not appear to have anything open today as far as wounds are concerned. 01/15/2019 on evaluation today patient appears to be doing more poorly yet again with regard to his left lower extremity. He has developed new wounds again after being discharged just recently. Unfortunately this continues to be the case that he will heal and then have subsequent new wounds. The last time I was hopeful that he may not end up coming back too quickly especially since he states he has been using his lymphedema pumps along with wearing his compression. Nonetheless he had a blister on the back of his leg that popped up on the left and this has opened up into an ulceration it is quite painful. 01/22/19 on evaluation today patient actually appears to be doing well with regard to his wound on the left lower extremity. He's been tolerating the dressing changes without complication including the compression wrap in the wound appears to be significantly  smaller today which is great news. Overall very pleased in this regard. 01/29/2019 on evaluation today patient appears to be doing well with regard to his left posterior lower extremity ulcer. He has been tolerating the dressing changes without complication. This is not completely healed but is getting much closer. We did order a Farrow wrap 4000 for him he has received this and has it with him today although I am not sure we are quite ready to start him on that as of yet. We are very close. 02/05/2019 on evaluation today patient actually appears to be doing quite well with regard to his left posterior lower extremity ulcer. He still has a very tiny opening remaining but the fortunate thing is he seems to be healing quite nicely. He also did get his Farrow wrap which I am hoping will help with his edema control as well at home. Fortunately there is no evidence of active infection. 02/12/2019 patient and fortunately appears to be doing poorly in regard to his wounds of the left lower extremity. He was very close to healing therefore we attempted to use his Velcro compression wraps continuing with lymphedema pumps at home. Unfortunately that does not seem to have done very well for him. He tells me that he wore them all the time but again I am not sure why if that is the case that he is having such significant edema. He is still on his fluid pills as well. With that being said there is no obvious sign of infection although I do wonder about the possibility of infection at this time as well. 02/19/2019 unfortunately upon evaluation today patient appears to be doing more poorly with regard to his left lower extremity. He is not showing signs of significant improvement and I think the biggest issue here is that he does have an infection that appears to likely be Pseudomonas. That is based on the blue-green drainage that were noted at this time. Unfortunately the antibiotic that has been on is not going  to take care of this at all. I think they will get a need to switch him to either Levaquin or Cipro and this was discussed with the patient. 02/26/2019 on evaluation today patient's lower extremity on the left appears to be doing significantly better as compared to last evaluation. Fortunately there is no signs of active infection at  this time. He has been tolerating the compression wrap without complication in fact he made it the whole week at this point. He is showing signs of excellent improvement I am very happy in this regard. With that being said he is having some issues with infection we did review the results of his culture which I noted today. He did have a positive finding for Enterobacter as well as Alcaligenes faecalis. Fortunately the Levaquin that I placed him on will work for both which is great news. There is no signs of systemic infection at this point. 10/30; left posterior leg wound in the setting of very significant edema and what looks like chronic venous inflammation. He has compression pumps but does not use them. We have been using 3 layer compression. Silver alginate to the wound as the primary dressing 03/18/2019 on evaluation today patient appears to be doing a little better compared to last time I saw him. He really has not been using his compression pumps he tells me that he is having too much discomfort. He has been keeping his wraps on however. He is only been taking his fluid pills every other day because he states they are not really helping and he has an appointment with his primary care provider at the [facility] tomorrow. Subsequently the wound itself on the left lower extremity does seem to be greatly improved compared to previous. MYLEN, MANGAN ([id number]) 03/25/2019 on evaluation today patient appears to be doing better with regard to his wounds on the bilateral lower extremities. The left is doing excellent the right is also doing better although both still do show some  signs of open wounds noted at this point unfortunately. Fortunately there is no signs of active infection at this time. The patient also is not really having any significant pain which is good news. Unfortunately there was some confusion with the referral on vascular disease and as far as getting the patient scheduled there can be contacting him later today to do this fortunately we got this straightened out. 04/01/2019 on evaluation today patient appears to be doing no fevers, chills, nausea, vomiting, or diarrhea. Excellent at this time with regard to his lower extremities. There does not appear to be any open wound at this point which is good news. Fortunately is also no signs of active infection at this time. Overall feel like the patient has done excellent with the compression the problem is every time we got him to this point and then subsequently go to using his own compression things just go right back to where they were. I am not sure how to address this we can try to get an appointment with vascular for 2 weeks now they have yet to call him. Obviously this has become frustrating for the patient as well. I think the issue has just been an honest error as far as scheduling is concerned but nonetheless still worn out the point where I am unsure of which direction we should take. 04/08/2019 on evaluation today patient actually appears to be doing well with regard to his lower extremities. There are no open wounds at this time and things seem to be managing quite nicely as far as the overall edema control is concerned. With that being said he does have his compression socks today for [place] to go ahead and reinitiate therapy in that manner at this point. He is going to be going for shoes to be measured on Wednesday and then coincidentally he will also be seeing  vascular on Thursday. Overall I think this is good news and again I am hopeful that they will be able to do something for him to help prevent  ongoing issues with edema control as well. No fevers, chills, nausea, vomiting, or diarrhea. 04/11/2019 on evaluation today patient actually appears to be doing poorly after just being discharged on Monday of this week. He had been experiencing issues with again blisters especially on the left lower extremity. With that being said he was completely healed and appeared to be doing great this past Monday. He then subsequently has new blisters that formed before his appointment with vascular this morning. He was also measured for shoes in the interim. With that being said we may have figured out what exactly is going on and why he continues to have issues like what we are seeing at this point. He takes his compression stockings off at nighttime and then he ends up having to sleep in his chair for 5-6 hours a night. He sleeps with his feet down he cannot really get him up in the recliner and therefore he is sleeping and the worst possible his position with his feet on the floor for that majority of the time. Again as I explained to him that is about one third at minimum at least one fourth of his day that he spending with his feet dangling down on the ground and the worst possible position they could be. I think this may be what is causing the issue. Subsequently I am leaning toward thinking that he may need a hospital bed in order to elevate his legs. We likely can have to coordinate this with his primary care provider at the [facility]. Readmission: 01/26/2021 this is a patient who presents for repeat evaluation here in the clinic although it is actually been couple of years since have seen him in fact it was December 2020 when I last saw him. Subsequently he never really healed but did end up being lost to follow-up. He tells me has been having issues ongoing with his lower extremities has bilateral lower extremity lymphedema no real significant or definitive open wounds but in general his lymphedema  is way out of control. We were never able to refer him to lymphedema clinic simply due to the fact to be honest we were never able to get him completely healed. I do not see anyone with open wounds. The patient does have evidence of type 2 diabetes mellitus, lymphedema, chronic venous insufficiency, and hypertension. That really has not changed since his last evaluation. 02/09/2021 upon evaluation today patient appears to be doing a little better in regard to his legs although he still having a tremendous amount of drainage especially on the left leg. Fortunately there does not appear to be any evidence of active infection. Of note when we looked into this further it appears that the patient did not have any absorptive dressing on it was just the 4-layer compression wrap. Nonetheless this is probably big part of the issue here. 10/10; he comes in today with 3 large areas on the upper right lower leg likely remanence of denuded blistering under his compression wraps. He has no other wounds on the right. On the left he has the denuded area on the left medial foot and ankle and on the left dorsal foot. Massive lymphedema in both feet dorsally. Using Zetuvit under compression We have increased home health visitation to twice a week to change the dressings and will change it  once 02/22/2021 upon evaluation today patient appears to be doing well currently with regard to his wounds. He has been tolerating the dressing changes without complication. Fortunately there does not appear to be any evidence of active infection which is great news. No fevers, chills, nausea, vomiting, or diarrhea. The biggest issue I see currently is that home health is not putting any medicine on the actual wounds before wrapping. 03/01/2021 upon evaluation today the patient's right leg actually appears to be doing quite well which is great news there does not appear to be any evidence of active infection at this time. No fevers,  chills, nausea, vomiting, or diarrhea. With that being said the patient is having issues on the left foot where he is having significant drainage is also an ammonia smell he does not have any animals at home and this makes me concerned about a bacteria producing urea as a byproduct. Again the possible common organisms will be E. coli, Proteus, and Enterococcus. All 3 of which can be successfully treated with Levaquin. For that reason I think that this may be a good option for [place] to consider placing him on and I did obtain a culture as well for confirmation sake. 03/08/2021 upon evaluation today patient appears to be doing unfortunately still somewhat poorly in regard to his leg ulcerations. He actually has an area on the right leg where he blistered due to the fact that his wrap slid down and caused an area of pinching on his skin and this has led to a significant issue here. 03/15/2021 upon evaluation today patient unfortunately has not been wrapped appropriately with absorptive dressings nor with the appropriate technique for the third layer of the 4-layer compression wrap. These are issues that we continue to try to address with the home health nurse. Also the absorptive dressing that she had was cut in half and therefore that causes things to leak out it does not actually trap the fluid in regard to the top of the foot overall I think that all these combined are really not seeing things improved significantly here. Fortunately there does not appear to be any signs of significant infection at this time which is good news. He still is having a tremendous amount of drainage. [place], [name] ([id number]) Objective Constitutional Obese and well-hydrated in no acute distress. Vitals Time Taken: 10:21 AM, Height: 73 in, Weight: 312 lbs, BMI: 41.2, Temperature: 98.4 F, Pulse: 59 bpm, Respiratory Rate: 18 breaths/min, Blood Pressure: 120/50 mmHg. Respiratory normal breathing without  difficulty. Psychiatric this patient is able to make decisions and demonstrates good insight into disease process. Alert and Oriented x 3. pleasant and cooperative. General Notes: Unfortunately patient continues to have significant drainage at this point. I do not see any signs of significant improvement unfortunately. I think that there is still a lot going on here as far as the drainage is concerned I am without something changing in that regard I am not certain he is going to get better. We need this to be wrapped appropriately with appropriate absorptive dressings as well. Integumentary (Hair, Skin) Wound #15 status is Open. Original cause of wound was Blister. The date acquired was: 03/08/2021. The wound has been in treatment 1 weeks. The wound is located on the Right,Medial Lower Leg. The wound measures 41cm length x 18cm width x 0.2cm depth; 579.624cm^2 area and 115.925cm^3 volume. There is Fat Layer (Subcutaneous Tissue) exposed. There is no tunneling or undermining noted. There is a large amount of serous drainage noted.  There is medium (34-66%) granulation within the wound bed. There is a medium (34-66%) amount of necrotic tissue within the wound bed including Adherent Slough. Assessment Active Problems ICD-10 Type 2 diabetes mellitus with other skin ulcer Lymphedema, not elsewhere classified Venous insufficiency (chronic) (peripheral) Non-pressure chronic ulcer of other part of left lower leg with fat layer exposed Non-pressure chronic ulcer of other part of right foot with fat layer exposed Essential (primary) hypertension Non-pressure chronic ulcer of other part of right lower leg limited to breakdown of skin Procedures Wound #15 Pre-procedure diagnosis of Wound #15 is a Lymphedema located on the Right,Medial Lower Leg . There was a Four Layer Compression Therapy Procedure by [name], RN. Post procedure Diagnosis Wound #15: Same as Pre-Procedure There was a Four Layer  Compression Therapy Procedure by [name], RN. Post procedure Diagnosis Wound #: Same as Pre-Procedure Plan Follow-up Appointments: Return Appointment in 1 week. Home Health: [facility] for wound care. May utilize formulary equivalent dressing for wound treatment orders unless otherwise specified. Home Health Nurse may visit PRN to address patient s wound care needs. - frequency 3 times per week - patient will be seen once at wound center Bartolo Montanye, [name] ([id number]) home health to see patient 2 times per week on Wed and friday Bathing/ Shower/ Hygiene: May shower with wound dressing protected with water repellent cover or cast protector. Edema Control - Lymphedema / Segmental Compressive Device / Other: Optional: One layer of unna paste to top of compression wrap (to act as an anchor). - Unna paste on feet and calf to secure wrap in place 4 Layer Compression System Lymphedema. - bi lat, apply zetuvits/ or formulary for extra absorbant dressing around ankles and foot area, 3 times per week COTTON LAYER SPIRAL, LIGHT TAN SPIRAL. WHITE WITH YELLOW LINE FIGURE 8 , COBAN SPIRAL Elevate, Exercise Daily and Avoid Standing for Long Periods of Time. Elevate legs to the level of the heart and pump ankles as often as possible Elevate leg(s) parallel to the floor when sitting. Compression Pump: Use compression pump on left lower extremity for 60 minutes, twice daily. - Start today Compression Pump: Use compression pump on right lower extremity for 60 minutes, twice daily. - Start today DO YOUR BEST to sleep in the bed at night. DO NOT sleep in your recliner. Long hours of sitting in a recliner leads to swelling of the legs and/or potential wounds on your backside. Other: - Contact prescriber regarding use of diuretics to reduce fluid overload. Off-Loading: Open toe surgical shoe WOUND #15: - Lower Leg Wound Laterality: Right, Medial Primary Dressing: Silvercel 4 1/4x 4 1/4 (in/in)  (Generic) Discharge Instructions: Apply Silvercel 4 1/4x 4 1/4 (in/in) as instructed Secondary Dressing: Zetuvit Plus Silicone Non-bordered 5x5 (in/in) Compression Wrap: Medichoice 4 layer Compression System, 35-40 mmHG (Generic) Discharge Instructions: Apply multi-layer wrap as directed. 1. Regular reach out to home health again in order to try to clarify make sure that she has the orders that are necessary as far as how to apply the absorptive dressings and how to appropriately wrap the leg. Subsequently I think that this is good to be of utmost importance. 2. I am also can recommend that we have the patient go ahead and follow-up as well with his primary and/your cardiologist in order to discuss fluid control and if there is anything to do from a medication standpoint try to help in this regard. He was supposed to have an echocardiogram with Dr. Rockey Situ at cardiology  but has not gone back to see him I think he needs to make this appointment ASAP. We will see patient back for reevaluation in 1 week here in the clinic. If anything worsens or changes patient will contact our office for additional recommendations. Electronic Signature(s) Signed: 03/15/2021 10:56:04 AM By: Worthy Keeler PA-C Previous Signature: 03/15/2021 10:47:05 AM Version By: Worthy Keeler PA-C Entered By: Worthy Keeler on 03/15/2021 10:56:04 [name] ([id number]) -------------------------------------------------------------------------------- SuperBill Details Patient Name: [name] Date of Service: 03/15/2021 Medical Record Number: [id number] Patient Account Number: [id number] Date of Birth/Sex: [birth year] (70 y.o. M) Treating RN: [name] Primary Care Provider: Felipa Eth Other Clinician: Referring Provider: Felipa Eth Treating Provider/Extender: Skipper Cliche in Treatment: 6 Diagnosis Coding ICD-10 Codes Code Description [phone] Type 2 diabetes mellitus with other skin ulcer I89.0  Lymphedema, not elsewhere classified I87.2 Venous insufficiency (chronic) (peripheral) L97.822 Non-pressure chronic ulcer of other part of left lower leg with fat layer exposed L97.512 Non-pressure chronic ulcer of other part of right foot with fat layer exposed I10 Essential (primary) hypertension L97.811 Non-pressure chronic ulcer of other part of right lower leg limited to breakdown of skin Facility Procedures CPT4: Description Modifier Quantity Code [id number] BILATERAL: Application of multi-layer venous compression system; leg (below knee), including 1 ankle and foot. Physician Procedures CPT4 Code: [id number] Description: 84166 - WC PHYS LEVEL 4 - EST PT Modifier: Quantity: 1 CPT4 Code: Description: ICD-10 Diagnosis Description E11.622 Type 2 diabetes mellitus with other skin ulcer I89.0 Lymphedema, not elsewhere classified I87.2 Venous insufficiency (chronic) (peripheral) L97.822 Non-pressure chronic ulcer of other part of left lower  leg with fat layer Modifier: exposed Quantity: Electronic Signature(s) Signed: 03/15/2021 10:56:18 AM By: Worthy Keeler PA-C Entered By: Worthy Keeler on 03/15/2021 10:56:17

## 2021-03-15 NOTE — Progress Notes (Addendum)
KATAI, MARSICO ([id number]) Visit Report for 03/15/2021 Arrival Information Details Patient Name: [name], [name] Date of Service: 03/15/2021 10:15 AM Medical Record Number: [id number] Patient Account Number: [id number] Date of Birth/Sex: 1951-05-01 (70 y.o. M) Treating RN: [name] Primary Care Jashira Cotugno: Felipa Eth Other Clinician: Referring Whitleigh Garramone: Felipa Eth Treating Gurley Climer/Extender: Skipper Cliche in Treatment: 6 Visit Information History Since Last Visit All ordered tests and consults were completed: No Patient Arrived: Ambulatory Added or deleted any medications: No Arrival Time: 10:17 Any new allergies or adverse reactions: No Accompanied By: self Had a fall or experienced change in No Transfer Assistance: None activities of daily living that may affect Patient Identification Verified: Yes risk of falls: Secondary Verification Process Completed: Yes Signs or symptoms of abuse/neglect since last visito No Patient Requires Transmission-Based Precautions: No Hospitalized since last visit: No Patient Has Alerts: No Implantable device outside of the clinic excluding No cellular tissue based products placed in the center since last visit: Has Dressing in Place as Prescribed: Yes Has Compression in Place as Prescribed: Yes Pain Present Now: No Electronic Signature(s) Signed: 03/19/2021 3:56:47 PM By: [name] RN Entered By: [name] on 03/15/2021 10:20:57 [name] ([id number]) -------------------------------------------------------------------------------- Clinic Level of Care Assessment Details Patient Name: [name] Date of Service: 03/15/2021 10:15 AM Medical Record Number: [id number] Patient Account Number: [id number] Date of Birth/Sex: 03/18/1951 (70 y.o. M) Treating RN: [name] Primary Care Paddy Neis: Felipa Eth Other Clinician: Referring Terance Pomplun: Felipa Eth Treating Destinae Neubecker/Extender: Skipper Cliche in Treatment:  6 Clinic Level of Care Assessment Items TOOL 1 Quantity Score []  - Use when EandM and Procedure is performed on INITIAL visit 0 ASSESSMENTS - Nursing Assessment / Reassessment []  - General Physical Exam (combine w/ comprehensive assessment (listed just below) when performed on new 0 pt. evals) []  - 0 Comprehensive Assessment (HX, ROS, Risk Assessments, Wounds Hx, etc.) ASSESSMENTS - Wound and Skin Assessment / Reassessment []  - Dermatologic / Skin Assessment (not related to wound area) 0 ASSESSMENTS - Ostomy and/or Continence Assessment and Care []  - Incontinence Assessment and Management 0 []  - 0 Ostomy Care Assessment and Management (repouching, etc.) PROCESS - Coordination of Care []  - Simple Patient / Family Education for ongoing care 0 []  - 0 Complex (extensive) Patient / Family Education for ongoing care []  - 0 Staff obtains Programmer, systems, Records, Test Results / Process Orders []  - 0 Staff telephones HHA, Nursing Homes / Clarify orders / etc []  - 0 Routine Transfer to another Facility (non-emergent condition) []  - 0 Routine Hospital Admission (non-emergent condition) []  - 0 New Admissions / Biomedical engineer / Ordering NPWT, Apligraf, etc. []  - 0 Emergency Hospital Admission (emergent condition) PROCESS - Special Needs []  - Pediatric / Minor Patient Management 0 []  - 0 Isolation Patient Management []  - 0 Hearing / Language / Visual special needs []  - 0 Assessment of Community assistance (transportation, D/C planning, etc.) []  - 0 Additional assistance / Altered mentation []  - 0 Support Surface(s) Assessment (bed, cushion, seat, etc.) INTERVENTIONS - Miscellaneous []  - External ear exam 0 []  - 0 Patient Transfer (multiple staff / Civil Service fast streamer / Similar devices) []  - 0 Simple Staple / Suture removal (25 or less) []  - 0 Complex Staple / Suture removal (26 or more) []  - 0 Hypo/Hyperglycemic Management (do not check if billed separately) []  - 0 Ankle /  Brachial Index (ABI) - do not check if billed separately Has the patient been seen at the hospital within the last three years: Yes Total  Score: 0 Level Of Care: ____ [name] ([id number]) Electronic Signature(s) Signed: 03/19/2021 3:56:47 PM By: [name] RN Entered By: [name] on 03/15/2021 10:41:47 [name] ([id number]) -------------------------------------------------------------------------------- Compression Therapy Details Patient Name: [name] Date of Service: 03/15/2021 10:15 AM Medical Record Number: [id number] Patient Account Number: [id number] Date of Birth/Sex: 07-09-1950 (70 y.o. M) Treating RN: [name] Primary Care Elick Aguilera: Felipa Eth Other Clinician: Referring Tirso Laws: Felipa Eth Treating Kentrell Guettler/Extender: Skipper Cliche in Treatment: 6 Compression Therapy Performed for Wound Assessment: Wound #15 Right,Medial Lower Leg Performed By: Clinician [name], RN Compression Type: Four Layer Post Procedure Diagnosis Same as Pre-procedure Electronic Signature(s) Signed: 03/19/2021 3:56:47 PM By: [name] RN Entered By: [name] on 03/15/2021 10:38:10 [name] ([id number]) -------------------------------------------------------------------------------- Compression Therapy Details Patient Name: [name] Date of Service: 03/15/2021 10:15 AM Medical Record Number: [id number] Patient Account Number: [id number] Date of Birth/Sex: 06-16-1950 (70 y.o. M) Treating RN: [name] Primary Care Iley Deignan: Felipa Eth Other Clinician: Referring Mihailo Sage: Felipa Eth Treating Kemuel Buchmann/Extender: Skipper Cliche in Treatment: 6 Compression Therapy Performed for Wound Assessment: NonWound Condition Lymphedema - Right Leg Performed By: Clinician [name], RN Compression Type: Four Layer Post Procedure Diagnosis Same as Pre-procedure Electronic Signature(s) Signed: 03/19/2021 3:56:47 PM By: [name]  RN Entered By: [name] on 03/15/2021 10:38:24 [name] ([id number]) -------------------------------------------------------------------------------- Encounter Discharge Information Details Patient Name: [name] Date of Service: 03/15/2021 10:15 AM Medical Record Number: [id number] Patient Account Number: [id number] Date of Birth/Sex: July 30, 1950 (70 y.o. M) Treating RN: [name] Primary Care Arlys Scatena: Felipa Eth Other Clinician: Referring Edy Mcbane: Felipa Eth Treating Blue Winther/Extender: Skipper Cliche in Treatment: 6 Encounter Discharge Information Items Discharge Condition: Stable Ambulatory Status: Ambulatory Discharge Destination: Home Transportation: Private Auto Accompanied By: self Schedule Follow-up Appointment: Yes Clinical Summary of Care: Patient Declined Electronic Signature(s) Signed: 03/19/2021 3:56:47 PM By: [name] RN Entered By: [name] on 03/15/2021 10:42:50 [name] ([id number]) -------------------------------------------------------------------------------- Lower Extremity Assessment Details Patient Name: [name] Date of Service: 03/15/2021 10:15 AM Medical Record Number: [id number] Patient Account Number: [id number] Date of Birth/Sex: 1951/02/05 (70 y.o. M) Treating RN: [name] Primary Care Jenesis Suchy: Felipa Eth Other Clinician: Referring Kathern Lobosco: Felipa Eth Treating Axxel Gude/Extender: Jeri Cos Weeks in Treatment: 6 Edema Assessment Assessed: [Left: No] [Right: No] [Left: Edema] [Right: :] Calf Left: Right: Point of Measurement: 35 cm From Medial Instep 42 cm 41.4 cm Ankle Left: Right: Point of Measurement: 10 cm From Medial Instep 46.7 cm 38 cm Electronic Signature(s) Signed: 03/19/2021 3:56:47 PM By: [name] RN Entered By: [name] on 03/15/2021 10:34:03 [name] ([id number]) -------------------------------------------------------------------------------- Multi  Wound Chart Details Patient Name: [name] Date of Service: 03/15/2021 10:15 AM Medical Record Number: [id number] Patient Account Number: [id number] Date of Birth/Sex: [birth year] (70 y.o. M) Treating RN: [name] Primary Care Porfiria Heinrich: Felipa Eth Other Clinician: Referring Andera Cranmer: Felipa Eth Treating Jhovani Griswold/Extender: Skipper Cliche in Treatment: 6 Vital Signs Height(in): 73 Pulse(bpm): 59 Weight(lbs): 312 Blood Pressure(mmHg): 120/50 Body Mass Index(BMI): 41 Temperature(F): 98.4 Respiratory Rate(breaths/min): 18 Photos: [N/A:N/A] Wound Location: Right, Medial Lower Leg N/A N/A Wounding Event: Blister N/A N/A Primary Etiology: Lymphedema N/A N/A Comorbid History: Hypertension, Peripheral Venous N/A N/A Disease, Type II Diabetes, Neuropathy, Received Chemotherapy Date Acquired: 03/08/2021 N/A N/A Weeks of Treatment: 1 N/A N/A Wound Status: Open N/A N/A Measurements L x W x D (cm) 41x18x0.2 N/A N/A Area (cm) : 592.924 N/A N/A Volume (cm) : 115.925 N/A N/A % Reduction in Area: -7280.00% N/A N/A % Reduction  in Volume: -2852.00% N/A N/A Classification: Full Thickness Without Exposed N/A N/A Support Structures Exudate Amount: Large N/A N/A Exudate Type: Serous N/A N/A Exudate Color: amber N/A N/A Granulation Amount: Medium (34-66%) N/A N/A Necrotic Amount: Medium (34-66%) N/A N/A Exposed Structures: Fat Layer (Subcutaneous Tissue): N/A N/A Yes Fascia: No Tendon: No Muscle: No Joint: No Bone: No Epithelialization: None N/A N/A Treatment Notes Electronic Signature(s) Signed: 03/19/2021 3:56:47 PM By: [name] RN Entered By: [name] on 03/15/2021 10:37:06 [name] ([id number]) -------------------------------------------------------------------------------- [place] Details Patient Name: [name] Date of Service: 03/15/2021 10:15 AM Medical Record Number: [id number] Patient Account Number:  [id number] Date of Birth/Sex: [birth year] (70 y.o. M) Treating RN: [name] Primary Care Willisha Sligar: Felipa Eth Other Clinician: Referring Tynlee Bayle: Felipa Eth Treating Cristiana Yochim/Extender: Skipper Cliche in Treatment: 6 Active Inactive Wound/Skin Impairment Nursing Diagnoses: Knowledge deficit related to ulceration/compromised skin integrity Goals: Patient/caregiver will verbalize understanding of skin care regimen Date Initiated: 01/26/2021 Target Resolution Date: 03/28/2021 Goal Status: Active Interventions: Assess patient/caregiver ability to obtain necessary supplies Assess patient/caregiver ability to perform ulcer/skin care regimen upon admission and as needed Assess ulceration(s) every visit Notes: Electronic Signature(s) Signed: 03/19/2021 3:56:47 PM By: [name] RN Entered By: [name] on 03/15/2021 10:36:56 [name] ([id number]) -------------------------------------------------------------------------------- Pain Assessment Details Patient Name: [name] Date of Service: 03/15/2021 10:15 AM Medical Record Number: [id number] Patient Account Number: [id number] Date of Birth/Sex: 1950/10/24 (70 y.o. M) Treating RN: [name] Primary Care Brayden Betters: Felipa Eth Other Clinician: Referring Vickii Volland: Felipa Eth Treating Claude Waldman/Extender: Skipper Cliche in Treatment: 6 Active Problems Location of Pain Severity and Description of Pain Patient Has Paino No Site Locations Pain Management and Medication Current Pain Management: Electronic Signature(s) Signed: 03/19/2021 3:56:47 PM By: [name] RN Entered By: [name] on 03/15/2021 10:22:11 [name] ([id number]) -------------------------------------------------------------------------------- Patient/Caregiver Education Details Patient Name: [name] Date of Service: 03/15/2021 10:15 AM Medical Record Number: [id number] Patient Account Number: [id number] Date of  Birth/Gender: June 16, 1950 (70 y.o. M) Treating RN: [name] Primary Care Physician: Felipa Eth Other Clinician: Referring Physician: Felipa Eth Treating Physician/Extender: Skipper Cliche in Treatment: 6 Education Assessment Education Provided To: Patient Education Topics Provided Wound/Skin Impairment: Methods: Explain/Verbal Responses: State content correctly Electronic Signature(s) Signed: 03/19/2021 3:56:47 PM By: [name] RN Entered By: [name] on 03/15/2021 10:42:17 [name] ([id number]) -------------------------------------------------------------------------------- Wound Assessment Details Patient Name: [name] Date of Service: 03/15/2021 10:15 AM Medical Record Number: [id number] Patient Account Number: [id number] Date of Birth/Sex: August 19, 1950 (70 y.o. M) Treating RN: [name] Primary Care Cristal Qadir: Felipa Eth Other Clinician: Referring Linzie Boursiquot: Felipa Eth Treating Jaleya Pebley/Extender: Jeri Cos Weeks in Treatment: 6 Wound Status Wound Number: 15 Primary Lymphedema Etiology: Wound Location: Right, Medial Lower Leg Wound Open Wounding Event: Blister Status: Date Acquired: 03/08/2021 Comorbid Hypertension, Peripheral Venous Disease, Type II Weeks Of Treatment: 1 History: Diabetes, Neuropathy, Received Chemotherapy Clustered Wound: No Photos Wound Measurements Length: (cm) 41 Width: (cm) 18 Depth: (cm) 0.2 Area: (cm) 579.624 Volume: (cm) 115.925 % Reduction in Area: -7280% % Reduction in Volume: -2852% Epithelialization: None Tunneling: No Undermining: No Wound Description Classification: Full Thickness Without Exposed Support Structures Exudate Amount: Large Exudate Type: Serous Exudate Color: amber Foul Odor After Cleansing: No Slough/Fibrino No Wound Bed Granulation Amount: Medium (34-66%) Exposed Structure Necrotic Amount: Medium (34-66%) Fascia Exposed: No Necrotic Quality: Adherent  Slough Fat Layer (Subcutaneous Tissue) Exposed: Yes Tendon Exposed: No Muscle Exposed: No Joint Exposed: No Bone Exposed: No Treatment Notes Wound #15 (Lower Leg)  Wound Laterality: Right, Medial Cleanser Peri-Wound Care Topical Primary Dressing KALIQ, LEGE ([id number]) Silvercel 4 1/4x 4 1/4 (in/in) Discharge Instruction: Apply Silvercel 4 1/4x 4 1/4 (in/in) as instructed Secondary Dressing Zetuvit Plus Silicone Non-bordered 5x5 (in/in) Secured With Compression Wrap Medichoice 4 layer Compression System, 35-40 mmHG Discharge Instruction: Apply multi-layer wrap as directed. Compression Stockings Add-Ons Electronic Signature(s) Signed: 03/19/2021 3:56:47 PM By: [name] RN Entered By: [name] on 03/15/2021 10:32:19 [name] ([id number]) -------------------------------------------------------------------------------- [place] Details Patient Name: [name] Date of Service: 03/15/2021 10:15 AM Medical Record Number: [id number] Patient Account Number: [id number] Date of Birth/Sex: [birth year] (70 y.o. M) Treating RN: [name] Primary Care Melida Northington: Felipa Eth Other Clinician: Referring Ever Gustafson: Felipa Eth Treating Lovelee Forner/Extender: Skipper Cliche in Treatment: 6 Vital Signs Time Taken: 10:21 Temperature (F): 98.4 Height (in): 73 Pulse (bpm): 59 Weight (lbs): 312 Respiratory Rate (breaths/min): 18 Body Mass Index (BMI): 41.2 Blood Pressure (mmHg): 120/50 Reference Range: 80 - 120 mg / dl Electronic Signature(s) Signed: 03/19/2021 3:56:47 PM By: [name] RN Entered By: [name] on 03/15/2021 10:21:46

## 2021-03-22 ENCOUNTER — Encounter: Payer: No Typology Code available for payment source | Admitting: Physician Assistant

## 2021-03-22 ENCOUNTER — Other Ambulatory Visit: Payer: Self-pay

## 2021-03-22 DIAGNOSIS — L97822 Non-pressure chronic ulcer of other part of left lower leg with fat layer exposed: Secondary | ICD-10-CM | POA: Diagnosis not present

## 2021-03-22 DIAGNOSIS — E11622 Type 2 diabetes mellitus with other skin ulcer: Secondary | ICD-10-CM | POA: Diagnosis not present

## 2021-03-22 DIAGNOSIS — I872 Venous insufficiency (chronic) (peripheral): Secondary | ICD-10-CM | POA: Diagnosis not present

## 2021-03-22 DIAGNOSIS — I10 Essential (primary) hypertension: Secondary | ICD-10-CM | POA: Diagnosis not present

## 2021-03-24 NOTE — Progress Notes (Signed)
[name], [name] ([id number]) Visit Report for 03/22/2021 Arrival Information Details Patient Name: [name], [name] Date of Service: 03/22/2021 12:00 PM Medical Record Number: [id number] Patient Account Number: [id number] Date of Birth/Sex: 07-03-1950 (70 y.o. M) Treating RN: Cornell Barman Primary Care Geraldine Tesar: Felipa Eth Other Clinician: Referring Jeylin Woodmansee: Felipa Eth Treating Bharath Bernstein/Extender: Skipper Cliche in Treatment: 7 Visit Information History Since Last Visit Has Dressing in Place as Prescribed: Yes Patient Arrived: Ambulatory Has Compression in Place as Prescribed: Yes Arrival Time: 12:16 Has Footwear/Offloading in Place as Prescribed: Yes Accompanied By: self Left: [place] Transfer Assistance: None Right: [place] Patient Identification Verified: Yes Pain Present Now: No Secondary Verification Process Completed: Yes Patient Requires Transmission-Based Precautions: No Patient Has Alerts: No Electronic Signature(s) Signed: 03/24/2021 4:58:16 PM By: Gretta Cool, BSN, RN, CWS, Kim RN, BSN Entered By: Gretta Cool, BSN, RN, CWS, Kim on 03/22/2021 12:19:10 [name] ([id number]) -------------------------------------------------------------------------------- Lower Extremity Assessment Details Patient Name: [name] Date of Service: 03/22/2021 12:00 PM Medical Record Number: [id number] Patient Account Number: [id number] Date of Birth/Sex: 12-19-1950 (70 y.o. M) Treating RN: Cornell Barman Primary Care Janeah Kovacich: Felipa Eth Other Clinician: Referring Gray Maugeri: Felipa Eth Treating Saahas Hidrogo/Extender: Jeri Cos Weeks in Treatment: 7 Edema Assessment Assessed: [Left: No] [Right: No] [Left: Edema] [Right: :] Calf Left: Right: Point of Measurement: 35 cm From Medial Instep 44.5 cm 42.5 cm Ankle Left: Right: Point of Measurement: 10 cm From Medial Instep 45 cm 36 cm Notes Unable to palpate pulses due to size and swelling of  feet .Electronic Signature(s) Signed: 03/24/2021 4:58:16 PM By: Gretta Cool, BSN, RN, CWS, Kim RN, BSN Entered By: Gretta Cool, BSN, RN, CWS, Kim on 03/22/2021 12:33:35 [name] ([id number]) -------------------------------------------------------------------------------- Multi Wound Chart Details Patient Name: [name] Date of Service: 03/22/2021 12:00 PM Medical Record Number: [id number] Patient Account Number: [id number] Date of Birth/Sex: August 11, 1950 (70 y.o. M) Treating RN: Cornell Barman Primary Care Kent Riendeau: Felipa Eth Other Clinician: Referring Yi Haugan: Felipa Eth Treating Aleene Swanner/Extender: Skipper Cliche in Treatment: 7 Vital Signs Height(in): 73 Pulse(bpm): 82 Weight(lbs): 312 Blood Pressure(mmHg): 144/81 Body Mass Index(BMI): 41 Temperature(F): 98.2 Respiratory Rate(breaths/min): 18 Photos: [15:No Photos] [N/A:N/A] Wound Location: [15:Left, Medial Lower Leg] [N/A:N/A] Wounding Event: [15:Blister] [N/A:N/A] Primary Etiology: [15:Lymphedema] [N/A:N/A] Comorbid History: [15:Hypertension, Peripheral Venous Disease, Type II Diabetes, Neuropathy, Received Chemotherapy] [N/A:N/A] Date Acquired: [15:03/08/2021] [N/A:N/A] Weeks of Treatment: [15:2] [N/A:N/A] Wound Status: [15:Open] [N/A:N/A] Measurements L x W x D (cm) [15:16x27x0.2] [N/A:N/A] Area (cm) : [00:762.263] [N/A:N/A] Volume (cm) : [33:54.562] [N/A:N/A] % Reduction in Area: [15:-4220.00%] [N/A:N/A] % Reduction in Volume: [15:-1628.00%] [N/A:N/A] Classification: [15:Full Thickness Without Exposed Support Structures] [N/A:N/A] Exudate Amount: [15:Large] [N/A:N/A] Exudate Type: [15:Serous] [N/A:N/A] Exudate Color: [15:amber] [N/A:N/A] Granulation Amount: [15:Small (1-33%)] [N/A:N/A] Necrotic Amount: [15:Medium (34-66%)] [N/A:N/A] Exposed Structures: [15:Fat Layer (Subcutaneous Tissue): Yes Fascia: No Tendon: No Muscle: No Joint: No Bone: No None] [N/A:N/A N/A] Treatment Notes Electronic  Signature(s) Signed: 03/24/2021 4:58:16 PM By: Gretta Cool, BSN, RN, CWS, Kim RN, BSN Entered By: Gretta Cool, BSN, RN, CWS, Kim on 03/22/2021 12:48:35 [name] ([id number]) -------------------------------------------------------------------------------- Multi-Disciplinary Care Plan Details Patient Name: [name] Date of Service: 03/22/2021 12:00 PM Medical Record Number: [id number] Patient Account Number: [id number] Date of Birth/Sex: [birth year] (70 y.o. M) Treating RN: Cornell Barman Primary Care Alik Mawson: Felipa Eth Other Clinician: Referring Anurag Scarfo: Felipa Eth Treating Geza Beranek/Extender: Skipper Cliche in Treatment: 7 Active Inactive Wound/Skin Impairment Nursing Diagnoses: Knowledge deficit related to ulceration/compromised skin integrity Goals: Patient/caregiver will verbalize understanding of skin care regimen Date Initiated: 01/26/2021 Target Resolution Date: 03/28/2021 Goal Status: Active  Interventions: Assess patient/caregiver ability to obtain necessary supplies Assess patient/caregiver ability to perform ulcer/skin care regimen upon admission and as needed Assess ulceration(s) every visit Notes: Electronic Signature(s) Signed: 03/24/2021 4:58:16 PM By: Gretta Cool, BSN, RN, CWS, Kim RN, BSN Entered By: Gretta Cool, BSN, RN, CWS, Kim on 03/22/2021 12:48:20 [name] ([id number]) -------------------------------------------------------------------------------- Pain Assessment Details Patient Name: [name] Date of Service: 03/22/2021 12:00 PM Medical Record Number: [id number] Patient Account Number: [id number] Date of Birth/Sex: [birth year] (70 y.o. M) Treating RN: Cornell Barman Primary Care Henson Fraticelli: Felipa Eth Other Clinician: Referring Leliana Kontz: Felipa Eth Treating Kamarii Buren/Extender: Skipper Cliche in Treatment: 7 Active Problems Location of Pain Severity and Description of Pain Patient Has Paino No Site Locations Pain Management and  Medication Current Pain Management: Electronic Signature(s) Signed: 03/24/2021 4:58:16 PM By: Gretta Cool, BSN, RN, CWS, Kim RN, BSN Entered By: Gretta Cool, BSN, RN, CWS, Kim on 03/22/2021 12:29:28 [name] ([id number]) -------------------------------------------------------------------------------- Patient/Caregiver Education Details Patient Name: [name] Date of Service: 03/22/2021 12:00 PM Medical Record Number: [id number] Patient Account Number: [id number] Date of Birth/Gender: Aug 07, 1950 (70 y.o. M) Treating RN: Cornell Barman Primary Care Physician: Felipa Eth Other Clinician: Referring Physician: Felipa Eth Treating Physician/Extender: Skipper Cliche in Treatment: 7 Education Assessment Education Provided To: Patient Education Topics Provided Wound/Skin Impairment: Handouts: Caring for Your Ulcer Methods: Demonstration, Explain/Verbal Responses: State content correctly Electronic Signature(s) Signed: 03/24/2021 4:58:16 PM By: Gretta Cool, BSN, RN, CWS, Kim RN, BSN Entered By: Gretta Cool, BSN, RN, CWS, Kim on 03/22/2021 14:24:52 [name] ([id number]) -------------------------------------------------------------------------------- Wound Assessment Details Patient Name: [name] Date of Service: 03/22/2021 12:00 PM Medical Record Number: [id number] Patient Account Number: [id number] Date of Birth/Sex: 07-30-1950 (70 y.o. M) Treating RN: Cornell Barman Primary Care Cela Newcom: Felipa Eth Other Clinician: Referring Zell Hylton: Felipa Eth Treating Ritik Stavola/Extender: Jeri Cos Weeks in Treatment: 7 Wound Status Wound Number: 15 Primary Lymphedema Etiology: Wound Location: Left, Medial Lower Leg Wound Open Wounding Event: Blister Status: Date Acquired: 03/08/2021 Comorbid Hypertension, Peripheral Venous Disease, Type II Weeks Of Treatment: 2 History: Diabetes, Neuropathy, Received Chemotherapy Clustered Wound: No Photos Photo Uploaded By: Gretta Cool, BSN, RN,  CWS, Kim on 03/22/2021 14:34:59 Wound Measurements Length: (cm) 16 Width: (cm) 27 Depth: (cm) 0.2 Area: (cm) 339.292 Volume: (cm) 67.858 % Reduction in Area: -4220% % Reduction in Volume: -1628% Epithelialization: None Wound Description Classification: Full Thickness Without Exposed Support Structures Exudate Amount: Large Exudate Type: Serous Exudate Color: amber Foul Odor After Cleansing: No Slough/Fibrino No Wound Bed Granulation Amount: Small (1-33%) Exposed Structure Necrotic Amount: Medium (34-66%) Fascia Exposed: No Necrotic Quality: Adherent Slough Fat Layer (Subcutaneous Tissue) Exposed: Yes Tendon Exposed: No Muscle Exposed: No Joint Exposed: No Bone Exposed: No Electronic Signature(s) Signed: 03/24/2021 4:58:16 PM By: Gretta Cool, BSN, RN, CWS, Kim RN, BSN Entered By: Gretta Cool, BSN, RN, CWS, Kim on 03/22/2021 12:31:22 [name] ([id number]) -------------------------------------------------------------------------------- [place] Details Patient Name: [name] Date of Service: 03/22/2021 12:00 PM Medical Record Number: [id number] Patient Account Number: [id number] Date of Birth/Sex: [birth year] (70 y.o. M) Treating RN: Cornell Barman Primary Care Stepheny Canal: Felipa Eth Other Clinician: Referring Lilyanna Lunt: Felipa Eth Treating Kelsea Mousel/Extender: Skipper Cliche in Treatment: 7 Vital Signs Time Taken: 12:19 Temperature (F): 98.2 Height (in): 73 Pulse (bpm): 82 Weight (lbs): 312 Respiratory Rate (breaths/min): 18 Body Mass Index (BMI): 41.2 Blood Pressure (mmHg): 144/81 Reference Range: 80 - 120 mg / dl Electronic Signature(s) Signed: 03/24/2021 4:58:16 PM By: Gretta Cool, BSN, RN, CWS, Kim RN, BSN Entered By: Gretta Cool, BSN, RN, CWS, Kim on 03/22/2021 12:20:01

## 2021-03-24 NOTE — Progress Notes (Signed)
CHANDLOR, NOECKER ([id number]) Visit Report for 03/22/2021 Chief Complaint Document Details Patient Name: [name], [name] Date of Service: 03/22/2021 12:00 PM Medical Record Number: [id number] Patient Account Number: [id number] Date of Birth/Sex: [birth year] (70 y.o. M) Treating RN: Cornell Barman Primary Care Provider: Felipa Eth Other Clinician: Referring Provider: Felipa Eth Treating Provider/Extender: Skipper Cliche in Treatment: 7 Information Obtained from: Patient Chief Complaint Left LE ulcers Electronic Signature(s) Signed: 03/22/2021 5:26:28 PM By: Worthy Keeler PA-C Entered By: Worthy Keeler on 03/22/2021 12:43:19 [name] ([id number]) -------------------------------------------------------------------------------- HPI Details Patient Name: [name] Date of Service: 03/22/2021 12:00 PM Medical Record Number: [id number] Patient Account Number: [id number] Date of Birth/Sex: [birth year] (70 y.o. M) Treating RN: Cornell Barman Primary Care Provider: Felipa Eth Other Clinician: Referring Provider: Felipa Eth Treating Provider/Extender: Skipper Cliche in Treatment: 7 History of Present Illness HPI Description: 70 year old male who presented to the ER with bilateral lower extremity blisters which had started last week. he has a past medical history of leukemia, diabetes mellitus, hypertension, edema of both lower extremities, his recurrent skin infections, peripheral vascular disease, coronary artery disease, congestive heart failure and peripheral neuropathy. in the ER he was given Rocephin and put on Silvadene cream. he was put on oral doxycycline and was asked to follow-up with the [facility]. His last hemoglobin A1c was 6.6 in December and he checks his blood sugar once a week. He does not have any physicians outside the [place] system. He does not recall any vascular duplex studies done either for arterial or venous disease but was told to wear compression  stockings which he does not use 05/30/2016 -- we have not yet received any of his notes from the [facility] hospital system and his arterial and venous duplex studies are scheduled here in [place] around mid February. We are unable to have his insurance accepted by home health agencies and hence he is getting dressings only once a week. 06/06/16 -- -- I received a call from the patient's PCP at the [facility] and spoke to Dr. Garvin Fila, phone number [phone] and fax number [phone]. She confirmed that no vascular testing was done over the last 5 years and she would be happy to do them if the patient did want them to be done at the [place] and we could fax him a request. Readmission: 70 year old male seen by as in February of this year and was referred to vein and vascular for studies and opinion from the vascular surgeons. The patient returns today with a fresh problem having had blisters on his left lower extremity which have been there for about 5 days and he clearly states that he has been wearing his compression stockings as advised though he could not read the moderate compression and has been wearing light compression. Review of his electronic medical records note that he had lower extremity arterial duplex examination done on 06/23/2016 which showed no hemodynamically significant stenosis in the bilateral lower extremity arterial system. He also had a lower extremity venous reflux examination done on 07/07/2016 and it was noted that he had venous incompetence in the right great saphenous vein and bilateral common femoral veins. Patient was seen by Dr. Tamala Julian on the same day and for some reason his notes do not reflect the venous studies or the arterial studies and he recommended patient do a venous duplex ultrasound to look for reflux and return to see him.he would also consider a lymph pump if required. The patient was told that his workup  was normal and hence the patient canceled his  follow-up appointment. 02/03/17 on evaluation today patient left medial lower extremity blister appears to be doing about the same. It is still continuing to drain and there's still the blistered skin covering the wound bed which is making it difficult for the alternate to do its job. Fortunately there is no evidence of cellulitis. No fevers chills noted. Patient states in general he is not having any significant discomfort. Patient's lower extremity arterial duplex exam revealed that patient was hemodynamically stable with no evidence of stenosis in regard to the bilateral lower extremities. The lower extremity venous reflux exam revealed the patient had venous incontinence noted in the right greater saphenous and bilateral common femoral vein. There is no evidence of deep or superficial vein thrombosis in the bilateral lower extremities. Readmission: 11/12/18 Patient presents for evaluation our clinic today concerning issues that he is having with his left lower extremity. He tells me that a couple weeks ago he began developing blisters on the left lower extremity along with increased swelling. He typically wears his compression stockings on a regular basis is previously been evaluated both here as well is with vascular surgery they would recommend lymphedema pumps but unfortunately that somehow fell through and he never heard anything back from that. Nonetheless I think lymphedema pumps would be beneficial for this patient. He does have a history of hypertension and diabetes. Obviously the chronic venous stasis and lymphedema as well. At this point the blisters have been given in more trouble he states sometimes when the blisters openings able to clean it down with alcohol and it will dry out and do well. Unfortunately that has not been the case this time. He is having some discomfort although this mean these with cleaning the areas he doesn't have discomfort just on a regular basis. He has not been  able to wear his compression stockings since the blisters arose due to the fact that of course it will drain into the socks causing additional issues and he didn't have any way to wrap this otherwise. He has increased to taking his Lasix every day instead of every other day. He sees his primary care provider later this month as well. No fevers, chills, nausea, or vomiting noted at this time. 11/19/18-Patient returns at 1 week, per intake RN the amount of seepage into the compression wraps was definitely improved, overall all the wounds are measuring smaller but continuing silver alginate to the wounds as primary dressing 11/26/18 on evaluation today patient appears to be doing quite well in regard to his left lower [place] ulcers. In fact of the areas that were noted initially he only has two regions still open. There is no evidence of active infection at this time. He still is not heard anything from the company regarding lymphedema pumps as of yet. Again as previously seen vascular they have not recommended any surgical intervention. 12/03/2018 on evaluation today patient actually appears to be doing quite well with regard to his lower extremity ulcers. In fact most of the areas appear to be healed the one spot which does not seem to be completely healed I am unsure of whether or not this is really draining that much but nonetheless there does not appear to be any signs of infection or significant drainage at this point. There is no sign of fever, chills, nausea, vomiting, or diarrhea. Overall I am pleased with how things have progressed I think is very close to being able to transition  to his home compression stockings. LIONAL, ICENOGLE ([id number]) 12/10/2018 upon evaluation today patient appears to be doing quite well with regard to his left lower extremity. He has been tolerating the dressing changes without complication. Fortunately there is no signs of active infection at this time. He appears after  thorough evaluation of his leg to only have 1 small area that remains open at this point everything else appears to be almost completely closed. He still have significant swelling of the left lower extremity. We had discussed discussing this with his primary care provider he is not able to see her in person they were at the [facility] and right now the [place] is not seeing patients on site. According to the patient anyway. Subsequently he did speak with her apparently and his primary care provider feels that he may likely have a DVT. With that being said she has not seen his leg she is just going off of his history. Nonetheless that is a concern that the patient now has as well and while I do not feel the DVT is likely we can definitely ensure that that is not the case I will go ahead and see about putting that order in today. Nonetheless otherwise I am in a recommend that we continue with the current wound care measures including the compression therapy most likely. We just need to ensure that his leg is indeed free of any DVTs. 12/17/2018 on evaluation today patient actually appears to be completely healed today. He does have 2 very small areas of blistering although this is not anything too significant at this point which is good news. With that being said I am in agreement with the fact that I think he is completely healed at this point. He does want to get back into his compression stocking. The good news is we have gotten approval from insurance for his lymphedema pumps we received a letter since last saw him last week. The other good news is his study did come back and showed no evidence of a DVT. 12/20/2018 on evaluation today patient presents for follow-up concerning his ongoing issues with his left lower extremity. He was actually discharged last Friday and did fairly well until he states blisters opened this morning. He tells me he has been wearing his compression stocking although he has a hard  time getting this on. There does not appear to be any signs of active infection at this time. No fevers, chills, nausea, vomiting, or diarrhea. 12/27/2018 on evaluation today patient appears to be doing very well with regard to his swelling of the left lower extremity the 4 layer compression wrap seems to have been beneficial for him. Fortunately there is no signs of active infection at this time. Patient has been tolerating the compression wrap without complication and his foot swelling in particular appears to be greatly improved. He does still have a wound on the lateral portion of his left leg I believe this is more of a blister that has now reopened. 01/03/2019 on evaluation today patient actually appears to be doing excellent in regard to his left lower extremity. He did receive his compression pumps and is actually use this 7 times since he was last here in the office. On top of the compression wrap he is now roughly 3 cm better at the calf and 2 cm better at the ankle he also states that his foot seem to go an issue better without even having to use a shoe horn. Obviously I  think this is all evidence that he is doing excellent in this regard. The other good news is he does not appear to have anything open today as far as wounds are concerned. 01/15/2019 on evaluation today patient appears to be doing more poorly yet again with regard to his left lower extremity. He has developed new wounds again after being discharged just recently. Unfortunately this continues to be the case that he will heal and then have subsequent new wounds. The last time I was hopeful that he may not end up coming back too quickly especially since he states he has been using his lymphedema pumps along with wearing his compression. Nonetheless he had a blister on the back of his leg that popped up on the left and this has opened up into an ulceration it is quite painful. 01/22/19 on evaluation today patient actually appears  to be doing well with regard to his wound on the left lower extremity. He's been tolerating the dressing changes without complication including the compression wrap in the wound appears to be significantly smaller today which is great news. Overall very pleased in this regard. 01/29/2019 on evaluation today patient appears to be doing well with regard to his left posterior lower extremity ulcer. He has been tolerating the dressing changes without complication. This is not completely healed but is getting much closer. We did order a Farrow wrap 4000 for him he has received this and has it with him today although I am not sure we are quite ready to start him on that as of yet. We are very close. 02/05/2019 on evaluation today patient actually appears to be doing quite well with regard to his left posterior lower extremity ulcer. He still has a very tiny opening remaining but the fortunate thing is he seems to be healing quite nicely. He also did get his Farrow wrap which I am hoping will help with his edema control as well at home. Fortunately there is no evidence of active infection. 02/12/2019 patient and fortunately appears to be doing poorly in regard to his wounds of the left lower extremity. He was very close to healing therefore we attempted to use his Velcro compression wraps continuing with lymphedema pumps at home. Unfortunately that does not seem to have done very well for him. He tells me that he wore them all the time but again I am not sure why if that is the case that he is having such significant edema. He is still on his fluid pills as well. With that being said there is no obvious sign of infection although I do wonder about the possibility of infection at this time as well. 02/19/2019 unfortunately upon evaluation today patient appears to be doing more poorly with regard to his left lower extremity. He is not showing signs of significant improvement and I think the biggest issue here is  that he does have an infection that appears to likely be Pseudomonas. That is based on the blue-green drainage that were noted at this time. Unfortunately the antibiotic that has been on is not going to take care of this at all. I think they will get a need to switch him to either Levaquin or Cipro and this was discussed with the patient. 02/26/2019 on evaluation today patient's lower extremity on the left appears to be doing significantly better as compared to last evaluation. Fortunately there is no signs of active infection at this time. He has been tolerating the compression wrap without complication in  fact he made it the whole week at this point. He is showing signs of excellent improvement I am very happy in this regard. With that being said he is having some issues with infection we did review the results of his culture which I noted today. He did have a positive finding for Enterobacter as well as Alcaligenes faecalis. Fortunately the Levaquin that I placed him on will work for both which is great news. There is no signs of systemic infection at this point. 10/30; left posterior leg wound in the setting of very significant edema and what looks like chronic venous inflammation. He has compression pumps but does not use them. We have been using 3 layer compression. Silver alginate to the wound as the primary dressing 03/18/2019 on evaluation today patient appears to be doing a little better compared to last time I saw him. He really has not been using his compression pumps he tells me that he is having too much discomfort. He has been keeping his wraps on however. He is only been taking his fluid pills every other day because he states they are not really helping and he has an appointment with his primary care provider at the [facility] tomorrow. Subsequently the wound itself on the left lower extremity does seem to be greatly improved compared to previous. 03/25/2019 on evaluation today patient appears  to be doing better with regard to his wounds on the bilateral lower extremities. The left is doing excellent the right is also doing better although both still do show some signs of open wounds noted at this point unfortunately. Fortunately there is no signs of active infection at this time. The patient also is not really having any significant pain which is good news. Unfortunately there was some confusion with the referral on vascular disease and as far as getting the patient scheduled there can be contacting him later today to do this AN, SCHNABEL ([id number]) fortunately we got this straightened out. 04/01/2019 on evaluation today patient appears to be doing no fevers, chills, nausea, vomiting, or diarrhea. Excellent at this time with regard to his lower extremities. There does not appear to be any open wound at this point which is good news. Fortunately is also no signs of active infection at this time. Overall feel like the patient has done excellent with the compression the problem is every time we got him to this point and then subsequently go to using his own compression things just go right back to where they were. I am not sure how to address this we can try to get an appointment with vascular for 2 weeks now they have yet to call him. Obviously this has become frustrating for the patient as well. I think the issue has just been an honest error as far as scheduling is concerned but nonetheless still worn out the point where I am unsure of which direction we should take. 04/08/2019 on evaluation today patient actually appears to be doing well with regard to his lower extremities. There are no open wounds at this time and things seem to be managing quite nicely as far as the overall edema control is concerned. With that being said he does have his compression socks today for [place] to go ahead and reinitiate therapy in that manner at this point. He is going to be going for shoes to be  measured on Wednesday and then coincidentally he will also be seeing vascular on Thursday. Overall I think this is good news and  again I am hopeful that they will be able to do something for him to help prevent ongoing issues with edema control as well. No fevers, chills, nausea, vomiting, or diarrhea. 04/11/2019 on evaluation today patient actually appears to be doing poorly after just being discharged on Monday of this week. He had been experiencing issues with again blisters especially on the left lower extremity. With that being said he was completely healed and appeared to be doing great this past Monday. He then subsequently has new blisters that formed before his appointment with vascular this morning. He was also measured for shoes in the interim. With that being said we may have figured out what exactly is going on and why he continues to have issues like what we are seeing at this point. He takes his compression stockings off at nighttime and then he ends up having to sleep in his chair for 5-6 hours a night. He sleeps with his feet down he cannot really get him up in the recliner and therefore he is sleeping and the worst possible his position with his feet on the floor for that majority of the time. Again as I explained to him that is about one third at minimum at least one fourth of his day that he spending with his feet dangling down on the ground and the worst possible position they could be. I think this may be what is causing the issue. Subsequently I am leaning toward thinking that he may need a hospital bed in order to elevate his legs. We likely can have to coordinate this with his primary care provider at the [facility]. Readmission: 01/26/2021 this is a patient who presents for repeat evaluation here in the clinic although it is actually been couple of years since have seen him in fact it was December 2020 when I last saw him. Subsequently he never really healed but did end up  being lost to follow-up. He tells me has been having issues ongoing with his lower extremities has bilateral lower extremity lymphedema no real significant or definitive open wounds but in general his lymphedema is way out of control. We were never able to refer him to lymphedema clinic simply due to the fact to be honest we were never able to get him completely healed. I do not see anyone with open wounds. The patient does have evidence of type 2 diabetes mellitus, lymphedema, chronic venous insufficiency, and hypertension. That really has not changed since his last evaluation. 02/09/2021 upon evaluation today patient appears to be doing a little better in regard to his legs although he still having a tremendous amount of drainage especially on the left leg. Fortunately there does not appear to be any evidence of active infection. Of note when we looked into this further it appears that the patient did not have any absorptive dressing on it was just the 4-layer compression wrap. Nonetheless this is probably big part of the issue here. 10/10; he comes in today with 3 large areas on the upper right lower leg likely remanence of denuded blistering under his compression wraps. He has no other wounds on the right. On the left he has the denuded area on the left medial foot and ankle and on the left dorsal foot. Massive lymphedema in both feet dorsally. Using Zetuvit under compression We have increased home health visitation to twice a week to change the dressings and will change it once 02/22/2021 upon evaluation today patient appears to be doing well currently  with regard to his wounds. He has been tolerating the dressing changes without complication. Fortunately there does not appear to be any evidence of active infection which is great news. No fevers, chills, nausea, vomiting, or diarrhea. The biggest issue I see currently is that home health is not putting any medicine on the actual wounds  before wrapping. 03/01/2021 upon evaluation today the patient's right leg actually appears to be doing quite well which is great news there does not appear to be any evidence of active infection at this time. No fevers, chills, nausea, vomiting, or diarrhea. With that being said the patient is having issues on the left foot where he is having significant drainage is also an ammonia smell he does not have any animals at home and this makes me concerned about a bacteria producing urea as a byproduct. Again the possible common organisms will be E. coli, Proteus, and Enterococcus. All 3 of which can be successfully treated with Levaquin. For that reason I think that this may be a good option for [place] to consider placing him on and I did obtain a culture as well for confirmation sake. 03/08/2021 upon evaluation today patient appears to be doing unfortunately still somewhat poorly in regard to his leg ulcerations. He actually has an area on the right leg where he blistered due to the fact that his wrap slid down and caused an area of pinching on his skin and this has led to a significant issue here. 03/15/2021 upon evaluation today patient unfortunately has not been wrapped appropriately with absorptive dressings nor with the appropriate technique for the third layer of the 4-layer compression wrap. These are issues that we continue to try to address with the home health nurse. Also the absorptive dressing that she had was cut in half and therefore that causes things to leak out it does not actually trap the fluid in regard to the top of the foot overall I think that all these combined are really not seeing things improved significantly here. Fortunately there does not appear to be any signs of significant infection at this time which is good news. He still is having a tremendous amount of drainage. 03/22/2021 upon evaluation today patient appears to be draining tremendously. He still continues to tell me  that he is using his pumps 2 times a day and that coupled with that tells me that he is elevating his legs as well. With that being said all things considered I am really just not seeing the improvement we would expect to see with the 4-layer compression wrap and all the above noted. He in fact had an extremely large Zetuvit dressing on both legs and that they were extremely filled to the max with fluid. This is after just being changed just before the weekend and this is Monday. Nonetheless I am concerned about the fact that there is something going on fairly significant that we cannot get any of this under control and that he is draining this significantly. He supposed be having an echocardiogram it sounds like scheduling has been an issue for him as far as getting in sooner. Its something to do with needing his cousin to drive him because of where it sat and he cannot drive himself to this appointment either way I really think he needs to try to see what he can do about making this happen a little sooner. He tells me he will call today. CALOB, BASKETTE ([id number]) Electronic Signature(s) Signed: 03/22/2021 5:11:43 PM By: Joaquim Lai  III, Lonie Newsham PA-C Entered By: Worthy Keeler on 03/22/2021 17:11:42 [name] ([id number]) -------------------------------------------------------------------------------- Physical Exam Details Patient Name: DEMAURION, DICIOCCIO Date of Service: 03/22/2021 12:00 PM Medical Record Number: [id number] Patient Account Number: [id number] Date of Birth/Sex: 1950/06/18 (70 y.o. M) Treating RN: Cornell Barman Primary Care Provider: Felipa Eth Other Clinician: Referring Provider: Felipa Eth Treating Provider/Extender: Jeri Cos Weeks in Treatment: 7 Constitutional Well-nourished and well-hydrated in no acute distress. Respiratory normal breathing without difficulty. Psychiatric this patient is able to make decisions and demonstrates good insight into disease process.  Alert and Oriented x 3. pleasant and cooperative. Notes Upon inspection patient's wound bed showed signs of poor healing overall. He is draining so much that there is really no way any of this is good to seal up he really does not have any true wounds per se it is really just more large areas of weeping and skin breakdown due to the tremendous amount of lymphedema fluid that is just draining. Electronic Signature(s) Signed: 03/22/2021 5:12:09 PM By: Worthy Keeler PA-C Entered By: Worthy Keeler on 03/22/2021 17:12:09 [name] ([id number]) -------------------------------------------------------------------------------- Physician Orders Details Patient Name: [name] Date of Service: 03/22/2021 12:00 PM Medical Record Number: [id number] Patient Account Number: [id number] Date of Birth/Sex: 1950-08-21 (70 y.o. M) Treating RN: Cornell Barman Primary Care Provider: Felipa Eth Other Clinician: Referring Provider: Felipa Eth Treating Provider/Extender: Skipper Cliche in Treatment: 7 Verbal / Phone Orders: No Diagnosis Coding ICD-10 Coding Code Description E11.622 Type 2 diabetes mellitus with other skin ulcer I89.0 Lymphedema, not elsewhere classified I87.2 Venous insufficiency (chronic) (peripheral) L97.822 Non-pressure chronic ulcer of other part of left lower leg with fat layer exposed L97.512 Non-pressure chronic ulcer of other part of right foot with fat layer exposed I10 Essential (primary) hypertension L97.811 Non-pressure chronic ulcer of other part of right lower leg limited to breakdown of skin Follow-up Appointments o Return Appointment in 1 week. [place] for wound care. May utilize formulary equivalent dressing for wound treatment orders unless otherwise specified. Home Health Nurse may visit PR[facility] to address patientos wound care needs. - frequency 3 times per week - patient will be seen once at wound center - home health to  see patient 2 times per week on Wed and friday Bathing/ Shower/ Hygiene o May shower with wound dressing protected with water repellent cover or cast protector. Edema Control - Lymphedema / Segmental Compressive Device / Other Bilateral Lower Extremities o Optional: One layer of unna paste to top of compression wrap (to act as an anchor). - Unna paste on feet and calf to secure wrap in place o 4 Layer Compression System Lymphedema. - bi lat, apply zetuvits/ or formulary for extra absorbant dressing around ankles and foot area, 3 times per week COTTON LAYER SPIRAL, LIGHT TAN SPIRAL. WHITE WITH YELLOW LINE FIGURE 8 , COBAN SPIRAL o Elevate, Exercise Daily and Avoid Standing for Long Periods of Time. o Elevate legs to the level of the heart and pump ankles as often as possible o Elevate leg(s) parallel to the floor when sitting. o Compression Pump: Use compression pump on left lower extremity for 60 minutes, twice daily. - Start today o Compression Pump: Use compression pump on right lower extremity for 60 minutes, twice daily. - Start today o DO YOUR BEST to sleep in the bed at night. DO NOT sleep in your recliner. Long hours of sitting in a recliner leads to swelling of the legs and/or potential wounds on your  backside. o Other: - Contact prescriber regarding use of diuretics to reduce fluid overload. Off-Loading Bilateral Lower Extremities o Open toe surgical shoe Wound Treatment Wound #15 - Lower Leg Wound Laterality: Left, Medial Secondary Dressing: Zetuvit Plus Silicone Non-bordered 5x5 (in/in) 3 x Per Week/30 Days Compression Wrap: Medichoice 4 layer Compression System, 35-40 mmHG (Generic) 3 x Per Week/30 Days Discharge Instructions: Apply multi-layer wrap as directed. Electronic Signature(s) Signed: 03/22/2021 5:26:28 PM By: Worthy Keeler PA-C Signed: 03/24/2021 4:58:16 PM By: Gretta Cool BSN, RN, CWS, Kim RN, BSN [place], [place] ([id number]) Entered By: Gretta Cool,  BSN, RN, CWS, Kim on 03/22/2021 12:50:11 [name] ([id number]) -------------------------------------------------------------------------------- Problem List Details Patient Name: STACY, DESHLER Date of Service: 03/22/2021 12:00 PM Medical Record Number: [id number] Patient Account Number: [id number] Date of Birth/Sex: Jul 31, 1950 (70 y.o. M) Treating RN: Cornell Barman Primary Care Provider: Felipa Eth Other Clinician: Referring Provider: Felipa Eth Treating Provider/Extender: Skipper Cliche in Treatment: 7 Active Problems ICD-10 Encounter Code Description Active Date MDM Diagnosis E11.622 Type 2 diabetes mellitus with other skin ulcer 01/26/2021 No Yes I89.0 Lymphedema, not elsewhere classified 01/26/2021 No Yes I87.2 Venous insufficiency (chronic) (peripheral) 01/26/2021 No Yes L97.822 Non-pressure chronic ulcer of other part of left lower leg with fat layer 01/26/2021 No Yes exposed L97.512 Non-pressure chronic ulcer of other part of right foot with fat layer 01/26/2021 No Yes exposed [place] (primary) hypertension 01/26/2021 No Yes L97.811 Non-pressure chronic ulcer of other part of right lower leg limited to 02/15/2021 No Yes breakdown of skin Inactive Problems Resolved Problems Electronic Signature(s) Signed: 03/22/2021 5:26:28 PM By: Worthy Keeler PA-C Entered By: Worthy Keeler on 03/22/2021 12:43:06 [name] ([id number]) -------------------------------------------------------------------------------- Progress Note Details Patient Name: [name] Date of Service: 03/22/2021 12:00 PM Medical Record Number: [id number] Patient Account Number: [id number] Date of Birth/Sex: 1950-12-05 (70 y.o. M) Treating RN: Cornell Barman Primary Care Provider: Felipa Eth Other Clinician: Referring Provider: Felipa Eth Treating Provider/Extender: Skipper Cliche in Treatment: 7 Subjective Chief Complaint Information obtained from Patient Left LE  ulcers History of Present Illness (HPI) 69 year old male who presented to the ER with bilateral lower extremity blisters which had started last week. he has a past medical history of leukemia, diabetes mellitus, hypertension, edema of both lower extremities, his recurrent skin infections, peripheral vascular disease, coronary artery disease, congestive heart failure and peripheral neuropathy. in the ER he was given Rocephin and put on Silvadene cream. he was put on oral doxycycline and was asked to follow-up with the [facility]. His last hemoglobin A1c was 6.6 in December and he checks his blood sugar once a week. He does not have any physicians outside the [place] system. He does not recall any vascular duplex studies done either for arterial or venous disease but was told to wear compression stockings which he does not use 05/30/2016 -- we have not yet received any of his notes from the [facility] hospital system and his arterial and venous duplex studies are scheduled here in [place] around mid February. We are unable to have his insurance accepted by home health agencies and hence he is getting dressings only once a week. 06/06/16 -- -- I received a call from the patient's PCP at the [facility] and spoke to Dr. Garvin Fila, phone number [phone] and fax number [phone]. She confirmed that no vascular testing was done over the last 5 years and she would be happy to do them if the patient did want them to be done at the [place] and  we could fax him a request. Readmission: 70 year old male seen by as in February of this year and was referred to vein and vascular for studies and opinion from the vascular surgeons. The patient returns today with a fresh problem having had blisters on his left lower extremity which have been there for about 5 days and he clearly states that he has been wearing his compression stockings as advised though he could not read the moderate compression and has been wearing  light compression. Review of his electronic medical records note that he had lower extremity arterial duplex examination done on 06/23/2016 which showed no hemodynamically significant stenosis in the bilateral lower extremity arterial system. He also had a lower extremity venous reflux examination done on 07/07/2016 and it was noted that he had venous incompetence in the right great saphenous vein and bilateral common femoral veins. Patient was seen by Dr. Tamala Julian on the same day and for some reason his notes do not reflect the venous studies or the arterial studies and he recommended patient do a venous duplex ultrasound to look for reflux and return to see him.he would also consider a lymph pump if required. The patient was told that his workup was normal and hence the patient canceled his follow-up appointment. 02/03/17 on evaluation today patient left medial lower extremity blister appears to be doing about the same. It is still continuing to drain and there's still the blistered skin covering the wound bed which is making it difficult for the alternate to do its job. Fortunately there is no evidence of cellulitis. No fevers chills noted. Patient states in general he is not having any significant discomfort. Patient's lower extremity arterial duplex exam revealed that patient was hemodynamically stable with no evidence of stenosis in regard to the bilateral lower extremities. The lower extremity venous reflux exam revealed the patient had venous incontinence noted in the right greater saphenous and bilateral common femoral vein. There is no evidence of deep or superficial vein thrombosis in the bilateral lower extremities. Readmission: 11/12/18 Patient presents for evaluation our clinic today concerning issues that he is having with his left lower extremity. He tells me that a couple weeks ago he began developing blisters on the left lower extremity along with increased swelling. He typically wears  his compression stockings on a regular basis is previously been evaluated both here as well is with vascular surgery they would recommend lymphedema pumps but unfortunately that somehow fell through and he never heard anything back from that. Nonetheless I think lymphedema pumps would be beneficial for this patient. He does have a history of hypertension and diabetes. Obviously the chronic venous stasis and lymphedema as well. At this point the blisters have been given in more trouble he states sometimes when the blisters openings able to clean it down with alcohol and it will dry out and do well. Unfortunately that has not been the case this time. He is having some discomfort although this mean these with cleaning the areas he doesn't have discomfort just on a regular basis. He has not been able to wear his compression stockings since the blisters arose due to the fact that of course it will drain into the socks causing additional issues and he didn't have any way to wrap this otherwise. He has increased to taking his Lasix every day instead of every other day. He sees his primary care provider later this month as well. No fevers, chills, nausea, or vomiting noted at this time. 11/19/18-Patient returns at  1 week, per intake RN the amount of seepage into the compression wraps was definitely improved, overall all the wounds are measuring smaller but continuing silver alginate to the wounds as primary dressing 11/26/18 on evaluation today patient appears to be doing quite well in regard to his left lower [place] ulcers. In fact of the areas that were noted initially he only has two regions still open. There is no evidence of active infection at this time. He still is not heard anything from the company regarding lymphedema pumps as of yet. Again as previously seen vascular they have not recommended any surgical intervention. LAYDON, MARTIS ([id number]) 12/03/2018 on evaluation today patient actually appears  to be doing quite well with regard to his lower extremity ulcers. In fact most of the areas appear to be healed the one spot which does not seem to be completely healed I am unsure of whether or not this is really draining that much but nonetheless there does not appear to be any signs of infection or significant drainage at this point. There is no sign of fever, chills, nausea, vomiting, or diarrhea. Overall I am pleased with how things have progressed I think is very close to being able to transition to his home compression stockings. 12/10/2018 upon evaluation today patient appears to be doing quite well with regard to his left lower extremity. He has been tolerating the dressing changes without complication. Fortunately there is no signs of active infection at this time. He appears after thorough evaluation of his leg to only have 1 small area that remains open at this point everything else appears to be almost completely closed. He still have significant swelling of the left lower extremity. We had discussed discussing this with his primary care provider he is not able to see her in person they were at the [facility] and right now the [place] is not seeing patients on site. According to the patient anyway. Subsequently he did speak with her apparently and his primary care provider feels that he may likely have a DVT. With that being said she has not seen his leg she is just going off of his history. Nonetheless that is a concern that the patient now has as well and while I do not feel the DVT is likely we can definitely ensure that that is not the case I will go ahead and see about putting that order in today. Nonetheless otherwise I am in a recommend that we continue with the current wound care measures including the compression therapy most likely. We just need to ensure that his leg is indeed free of any DVTs. 12/17/2018 on evaluation today patient actually appears to be completely healed today. He  does have 2 very small areas of blistering although this is not anything too significant at this point which is good news. With that being said I am in agreement with the fact that I think he is completely healed at this point. He does want to get back into his compression stocking. The good news is we have gotten approval from insurance for his lymphedema pumps we received a letter since last saw him last week. The other good news is his study did come back and showed no evidence of a DVT. 12/20/2018 on evaluation today patient presents for follow-up concerning his ongoing issues with his left lower extremity. He was actually discharged last Friday and did fairly well until he states blisters opened this morning. He tells me he has been wearing his  compression stocking although he has a hard time getting this on. There does not appear to be any signs of active infection at this time. No fevers, chills, nausea, vomiting, or diarrhea. 12/27/2018 on evaluation today patient appears to be doing very well with regard to his swelling of the left lower extremity the 4 layer compression wrap seems to have been beneficial for him. Fortunately there is no signs of active infection at this time. Patient has been tolerating the compression wrap without complication and his foot swelling in particular appears to be greatly improved. He does still have a wound on the lateral portion of his left leg I believe this is more of a blister that has now reopened. 01/03/2019 on evaluation today patient actually appears to be doing excellent in regard to his left lower extremity. He did receive his compression pumps and is actually use this 7 times since he was last here in the office. On top of the compression wrap he is now roughly 3 cm better at the calf and 2 cm better at the ankle he also states that his foot seem to go an issue better without even having to use a shoe horn. Obviously I think this is all evidence that  he is doing excellent in this regard. The other good news is he does not appear to have anything open today as far as wounds are concerned. 01/15/2019 on evaluation today patient appears to be doing more poorly yet again with regard to his left lower extremity. He has developed new wounds again after being discharged just recently. Unfortunately this continues to be the case that he will heal and then have subsequent new wounds. The last time I was hopeful that he may not end up coming back too quickly especially since he states he has been using his lymphedema pumps along with wearing his compression. Nonetheless he had a blister on the back of his leg that popped up on the left and this has opened up into an ulceration it is quite painful. 01/22/19 on evaluation today patient actually appears to be doing well with regard to his wound on the left lower extremity. He's been tolerating the dressing changes without complication including the compression wrap in the wound appears to be significantly smaller today which is great news. Overall very pleased in this regard. 01/29/2019 on evaluation today patient appears to be doing well with regard to his left posterior lower extremity ulcer. He has been tolerating the dressing changes without complication. This is not completely healed but is getting much closer. We did order a Farrow wrap 4000 for him he has received this and has it with him today although I am not sure we are quite ready to start him on that as of yet. We are very close. 02/05/2019 on evaluation today patient actually appears to be doing quite well with regard to his left posterior lower extremity ulcer. He still has a very tiny opening remaining but the fortunate thing is he seems to be healing quite nicely. He also did get his Farrow wrap which I am hoping will help with his edema control as well at home. Fortunately there is no evidence of active infection. 02/12/2019 patient and  fortunately appears to be doing poorly in regard to his wounds of the left lower extremity. He was very close to healing therefore we attempted to use his Velcro compression wraps continuing with lymphedema pumps at home. Unfortunately that does not seem to have done very well  for him. He tells me that he wore them all the time but again I am not sure why if that is the case that he is having such significant edema. He is still on his fluid pills as well. With that being said there is no obvious sign of infection although I do wonder about the possibility of infection at this time as well. 02/19/2019 unfortunately upon evaluation today patient appears to be doing more poorly with regard to his left lower extremity. He is not showing signs of significant improvement and I think the biggest issue here is that he does have an infection that appears to likely be Pseudomonas. That is based on the blue-green drainage that were noted at this time. Unfortunately the antibiotic that has been on is not going to take care of this at all. I think they will get a need to switch him to either Levaquin or Cipro and this was discussed with the patient. 02/26/2019 on evaluation today patient's lower extremity on the left appears to be doing significantly better as compared to last evaluation. Fortunately there is no signs of active infection at this time. He has been tolerating the compression wrap without complication in fact he made it the whole week at this point. He is showing signs of excellent improvement I am very happy in this regard. With that being said he is having some issues with infection we did review the results of his culture which I noted today. He did have a positive finding for Enterobacter as well as Alcaligenes faecalis. Fortunately the Levaquin that I placed him on will work for both which is great news. There is no signs of systemic infection at this point. 10/30; left posterior leg wound in  the setting of very significant edema and what looks like chronic venous inflammation. He has compression pumps but does not use them. We have been using 3 layer compression. Silver alginate to the wound as the primary dressing 03/18/2019 on evaluation today patient appears to be doing a little better compared to last time I saw him. He really has not been using his compression pumps he tells me that he is having too much discomfort. He has been keeping his wraps on however. He is only been taking his fluid pills every other day because he states they are not really helping and he has an appointment with his primary care provider at the [facility] tomorrow. Subsequently the wound itself on the left lower extremity does seem to be greatly improved compared to previous. TRENDEN, HAZELRIGG ([id number]) 03/25/2019 on evaluation today patient appears to be doing better with regard to his wounds on the bilateral lower extremities. The left is doing excellent the right is also doing better although both still do show some signs of open wounds noted at this point unfortunately. Fortunately there is no signs of active infection at this time. The patient also is not really having any significant pain which is good news. Unfortunately there was some confusion with the referral on vascular disease and as far as getting the patient scheduled there can be contacting him later today to do this fortunately we got this straightened out. 04/01/2019 on evaluation today patient appears to be doing no fevers, chills, nausea, vomiting, or diarrhea. Excellent at this time with regard to his lower extremities. There does not appear to be any open wound at this point which is good news. Fortunately is also no signs of active infection at this time. Overall feel like the  patient has done excellent with the compression the problem is every time we got him to this point and then subsequently go to using his own compression things just go right  back to where they were. I am not sure how to address this we can try to get an appointment with vascular for 2 weeks now they have yet to call him. Obviously this has become frustrating for the patient as well. I think the issue has just been an honest error as far as scheduling is concerned but nonetheless still worn out the point where I am unsure of which direction we should take. 04/08/2019 on evaluation today patient actually appears to be doing well with regard to his lower extremities. There are no open wounds at this time and things seem to be managing quite nicely as far as the overall edema control is concerned. With that being said he does have his compression socks today for [place] to go ahead and reinitiate therapy in that manner at this point. He is going to be going for shoes to be measured on Wednesday and then coincidentally he will also be seeing vascular on Thursday. Overall I think this is good news and again I am hopeful that they will be able to do something for him to help prevent ongoing issues with edema control as well. No fevers, chills, nausea, vomiting, or diarrhea. 04/11/2019 on evaluation today patient actually appears to be doing poorly after just being discharged on Monday of this week. He had been experiencing issues with again blisters especially on the left lower extremity. With that being said he was completely healed and appeared to be doing great this past Monday. He then subsequently has new blisters that formed before his appointment with vascular this morning. He was also measured for shoes in the interim. With that being said we may have figured out what exactly is going on and why he continues to have issues like what we are seeing at this point. He takes his compression stockings off at nighttime and then he ends up having to sleep in his chair for 5-6 hours a night. He sleeps with his feet down he cannot really get him up in the recliner and therefore he is  sleeping and the worst possible his position with his feet on the floor for that majority of the time. Again as I explained to him that is about one third at minimum at least one fourth of his day that he spending with his feet dangling down on the ground and the worst possible position they could be. I think this may be what is causing the issue. Subsequently I am leaning toward thinking that he may need a hospital bed in order to elevate his legs. We likely can have to coordinate this with his primary care provider at the [facility]. Readmission: 01/26/2021 this is a patient who presents for repeat evaluation here in the clinic although it is actually been couple of years since have seen him in fact it was December 2020 when I last saw him. Subsequently he never really healed but did end up being lost to follow-up. He tells me has been having issues ongoing with his lower extremities has bilateral lower extremity lymphedema no real significant or definitive open wounds but in general his lymphedema is way out of control. We were never able to refer him to lymphedema clinic simply due to the fact to be honest we were never able to get him completely healed.  I do not see anyone with open wounds. The patient does have evidence of type 2 diabetes mellitus, lymphedema, chronic venous insufficiency, and hypertension. That really has not changed since his last evaluation. 02/09/2021 upon evaluation today patient appears to be doing a little better in regard to his legs although he still having a tremendous amount of drainage especially on the left leg. Fortunately there does not appear to be any evidence of active infection. Of note when we looked into this further it appears that the patient did not have any absorptive dressing on it was just the 4-layer compression wrap. Nonetheless this is probably big part of the issue here. 10/10; he comes in today with 3 large areas on the upper right lower leg  likely remanence of denuded blistering under his compression wraps. He has no other wounds on the right. On the left he has the denuded area on the left medial foot and ankle and on the left dorsal foot. Massive lymphedema in both feet dorsally. Using Zetuvit under compression We have increased home health visitation to twice a week to change the dressings and will change it once 02/22/2021 upon evaluation today patient appears to be doing well currently with regard to his wounds. He has been tolerating the dressing changes without complication. Fortunately there does not appear to be any evidence of active infection which is great news. No fevers, chills, nausea, vomiting, or diarrhea. The biggest issue I see currently is that home health is not putting any medicine on the actual wounds before wrapping. 03/01/2021 upon evaluation today the patient's right leg actually appears to be doing quite well which is great news there does not appear to be any evidence of active infection at this time. No fevers, chills, nausea, vomiting, or diarrhea. With that being said the patient is having issues on the left foot where he is having significant drainage is also an ammonia smell he does not have any animals at home and this makes me concerned about a bacteria producing urea as a byproduct. Again the possible common organisms will be E. coli, Proteus, and Enterococcus. All 3 of which can be successfully treated with Levaquin. For that reason I think that this may be a good option for [place] to consider placing him on and I did obtain a culture as well for confirmation sake. 03/08/2021 upon evaluation today patient appears to be doing unfortunately still somewhat poorly in regard to his leg ulcerations. He actually has an area on the right leg where he blistered due to the fact that his wrap slid down and caused an area of pinching on his skin and this has led to a significant issue here. 03/15/2021 upon  evaluation today patient unfortunately has not been wrapped appropriately with absorptive dressings nor with the appropriate technique for the third layer of the 4-layer compression wrap. These are issues that we continue to try to address with the home health nurse. Also the absorptive dressing that she had was cut in half and therefore that causes things to leak out it does not actually trap the fluid in regard to the top of the foot overall I think that all these combined are really not seeing things improved significantly here. Fortunately there does not appear to be any signs of significant infection at this time which is good news. He still is having a tremendous amount of drainage. 03/22/2021 upon evaluation today patient appears to be draining tremendously. He still continues to tell me that he is  using his pumps 2 times a day and that coupled with that tells me that he is elevating his legs as well. With that being said all things considered I am really just not seeing the improvement we would expect to see with the 4-layer compression wrap and all the above noted. He in fact had an extremely large Zetuvit dressing on both legs and that they were extremely filled to the max with fluid. This is after just being changed just before the weekend and this DACEN, FRAYRE ([id number]) is Monday. Nonetheless I am concerned about the fact that there is something going on fairly significant that we cannot get any of this under control and that he is draining this significantly. He supposed be having an echocardiogram it sounds like scheduling has been an issue for him as far as getting in sooner. Its something to do with needing his cousin to drive him because of where it sat and he cannot drive himself to this appointment either way I really think he needs to try to see what he can do about making this happen a little sooner. He tells me he will call today. Objective Constitutional Well-nourished and  well-hydrated in no acute distress. Vitals Time Taken: 12:19 PM, Height: 73 in, Weight: 312 lbs, BMI: 41.2, Temperature: 98.2 F, Pulse: 82 bpm, Respiratory Rate: 18 breaths/min, Blood Pressure: 144/81 mmHg. Respiratory normal breathing without difficulty. Psychiatric this patient is able to make decisions and demonstrates good insight into disease process. Alert and Oriented x 3. pleasant and cooperative. General Notes: Upon inspection patient's wound bed showed signs of poor healing overall. He is draining so much that there is really no way any of this is good to seal up he really does not have any true wounds per se it is really just more large areas of weeping and skin breakdown due to the tremendous amount of lymphedema fluid that is just draining. Integumentary (Hair, Skin) Wound #15 status is Open. Original cause of wound was Blister. The date acquired was: 03/08/2021. The wound has been in treatment 2 weeks. The wound is located on the Left,Medial Lower Leg. The wound measures 16cm length x 27cm width x 0.2cm depth; 339.292cm^2 area and 67.858cm^3 volume. There is Fat Layer (Subcutaneous Tissue) exposed. There is a large amount of serous drainage noted. There is small (1-33%) granulation within the wound bed. There is a medium (34-66%) amount of necrotic tissue within the wound bed including Adherent Slough. 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 Home Health: [facility] for wound care. May utilize formulary  equivalent dressing for wound treatment orders unless otherwise specified. Home Health Nurse may visit PRN to address patient s wound care needs. - frequency 3 times per week - patient will be seen once at wound center - home health to see patient 2 times per week on Wed and friday Bathing/ Shower/ Hygiene: May shower with wound dressing protected with water repellent cover or cast protector. Edema Control - Lymphedema / Segmental Compressive Device / Other: Optional: One layer of unna paste to top of compression wrap (to act as an anchor). -  Unna paste on feet and calf to secure wrap in place 4 Layer Compression System Lymphedema. - bi lat, apply zetuvits/ or formulary for extra absorbant dressing around ankles and foot area, 3 Mathisen, Karder ([id number]) times per week [place]. WHITE WITH YELLOW LINE FIGURE 8 , [employer] Elevate, Exercise Daily and Avoid Standing for Long Periods of Time. Elevate legs to the level of the heart and pump ankles as often as possible Elevate leg(s) parallel to the floor when sitting. Compression Pump: Use compression pump on left lower extremity for 60 minutes, twice daily. - Start today Compression Pump: Use compression pump on right lower extremity for 60 minutes, twice daily. - Start today DO YOUR BEST to sleep in the bed at night. DO NOT sleep in your recliner. Long hours of sitting in a recliner leads to swelling of the legs and/or potential wounds on your backside. Other: - Contact prescriber regarding use of diuretics to reduce fluid overload. Off-Loading: Open toe surgical shoe WOUND #15: - Lower Leg Wound Laterality: Left, Medial Secondary Dressing: Zetuvit Plus Silicone Non-bordered 5x5 (in/in) 3 x Per Week/30 Days Compression Wrap: Medichoice 4 layer Compression System, 35-40 mmHG (Generic) 3 x Per Week/30 Days Discharge Instructions: Apply multi-layer wrap as directed. 1. Would recommend currently that we continue  with the 4-layer compression wrap along with the Zetuvit to cover I think this is done better than most anything else that we tried in the past but it still just way too much drainage. 2. I am also can recommend that we have the patient continue with the elevation is much as possible he should also be using his pumps and again even if he is doing it twice a day needs this port he still needs to keep doing that because it only be worse if he was not using the pumps. 3. With regard to his cardiologist I did encourage him to contact them today to try to work into a sooner appointment he did call back he stated the soonest he can get it was December 7 at 11:30 AM. Again this is definitely better than the 15th still not as early as I like to see but I completely understand were coming up on a holiday week next week as well. We will see patient back for reevaluation in 1 week here in the clinic. If anything worsens or changes patient will contact our office for additional recommendations. Electronic Signature(s) Signed: 03/22/2021 5:13:05 PM By: Worthy Keeler PA-C Entered By: Worthy Keeler on 03/22/2021 17:13:05 [name] ([id number]) -------------------------------------------------------------------------------- SuperBill Details Patient Name: [name] Date of Service: 03/22/2021 Medical Record Number: [id number] Patient Account Number: [id number] Date of Birth/Sex: 1950/08/13 (70 y.o. M) Treating RN: Cornell Barman Primary Care Provider: Felipa Eth Other Clinician: Referring Provider: Felipa Eth Treating Provider/Extender: Skipper Cliche in Treatment: 7 Diagnosis Coding ICD-10 Codes Code Description E11.622 Type 2 diabetes mellitus with other skin ulcer I89.0 Lymphedema, not elsewhere classified I87.2 Venous insufficiency (chronic) (peripheral) L97.822 Non-pressure chronic ulcer of other part of left lower leg with fat layer exposed L97.512 Non-pressure chronic ulcer  of other part of right foot with fat layer exposed I10 Essential (primary) hypertension L97.811 Non-pressure chronic ulcer of other part of right lower leg limited to breakdown of skin Facility Procedures CPT4: Description Modifier Quantity Code [id number] BILATERAL: Application of multi-layer venous compression system; leg (below knee), including 1 ankle and foot. Physician Procedures CPT4 Code: [id number] Description: 70350 - WC PHYS LEVEL  4 - EST PT Modifier: Quantity: 1 CPT4 Code: Description: ICD-10 Diagnosis Description E11.622 Type 2 diabetes mellitus with other skin ulcer I89.0 Lymphedema, not elsewhere classified I87.2 Venous insufficiency (chronic) (peripheral) L97.822 Non-pressure chronic ulcer of other part of left lower  leg with fat layer Modifier: exposed Quantity: Electronic Signature(s) Signed: 03/22/2021 5:13:34 PM By: Worthy Keeler PA-C Entered By: Worthy Keeler on 03/22/2021 17:13:33

## 2021-03-29 ENCOUNTER — Encounter: Payer: No Typology Code available for payment source | Admitting: Physician Assistant

## 2021-03-29 ENCOUNTER — Other Ambulatory Visit: Payer: Self-pay

## 2021-03-29 DIAGNOSIS — L97212 Non-pressure chronic ulcer of right calf with fat layer exposed: Secondary | ICD-10-CM | POA: Diagnosis not present

## 2021-03-29 DIAGNOSIS — L97822 Non-pressure chronic ulcer of other part of left lower leg with fat layer exposed: Secondary | ICD-10-CM | POA: Diagnosis not present

## 2021-03-29 DIAGNOSIS — I890 Lymphedema, not elsewhere classified: Secondary | ICD-10-CM | POA: Diagnosis not present

## 2021-03-29 DIAGNOSIS — E11621 Type 2 diabetes mellitus with foot ulcer: Secondary | ICD-10-CM | POA: Diagnosis not present

## 2021-03-29 DIAGNOSIS — E11622 Type 2 diabetes mellitus with other skin ulcer: Secondary | ICD-10-CM | POA: Diagnosis not present

## 2021-03-29 NOTE — Progress Notes (Addendum)
BRIGIDO, MERA ([id number]) Visit Report for 03/29/2021 Chief Complaint Document Details Patient Name: [name], [name] Date of Service: 03/29/2021 10:15 AM Medical Record Number: [id number] Patient Account Number: [id number] Date of Birth/Sex: July 27, 1950 (70 y.o. M) Treating RN: [name] Primary Care Provider: Felipa Eth Other Clinician: Referring Provider: Felipa Eth Treating Provider/Extender: Skipper Cliche in Treatment: 8 Information Obtained from: Patient Chief Complaint Left LE ulcers Electronic Signature(s) Signed: 03/29/2021 10:20:34 AM By: Worthy Keeler PA-C Entered By: Worthy Keeler on 03/29/2021 10:20:33 [name] ([id number]) -------------------------------------------------------------------------------- HPI Details Patient Name: [name] Date of Service: 03/29/2021 10:15 AM Medical Record Number: [id number] Patient Account Number: [id number] Date of Birth/Sex: 07/22/1950 (70 y.o. M) Treating RN: [name] Primary Care Provider: Felipa Eth Other Clinician: Referring Provider: Felipa Eth Treating Provider/Extender: Skipper Cliche in Treatment: 8 History of Present Illness HPI Description: 70 year old male who presented to the ER with bilateral lower extremity blisters which had started last week. he has a past medical history of leukemia, diabetes mellitus, hypertension, edema of both lower extremities, his recurrent skin infections, peripheral vascular disease, coronary artery disease, congestive heart failure and peripheral neuropathy. in the ER he was given Rocephin and put on Silvadene cream. he was put on oral doxycycline and was asked to follow-up with the [facility]. His last hemoglobin A1c was 6.6 in December and he checks his blood sugar once a week. He does not have any physicians outside the [place] system. He does not recall any vascular duplex studies done either for arterial or venous disease but was told to wear  compression stockings which he does not use 05/30/2016 -- we have not yet received any of his notes from the [facility] hospital system and his arterial and venous duplex studies are scheduled here in [place] around mid February. We are unable to have his insurance accepted by home health agencies and hence he is getting dressings only once a week. 06/06/16 -- -- I received a call from the patient's PCP at the [name] and spoke to Dr. Garvin Fila, phone number [phone] and fax number [phone]. She confirmed that no vascular testing was done over the last 5 years and she would be happy to do them if the patient did want them to be done at the [place] and we could fax him a request. Readmission: 70 year old male seen by as in February of this year and was referred to vein and vascular for studies and opinion from the vascular surgeons. The patient returns today with a fresh problem having had blisters on his left lower extremity which have been there for about 5 days and he clearly states that he has been wearing his compression stockings as advised though he could not read the moderate compression and has been wearing light compression. Review of his electronic medical records note that he had lower extremity arterial duplex examination done on 06/23/2016 which showed no hemodynamically significant stenosis in the bilateral lower extremity arterial system. He also had a lower extremity venous reflux examination done on 07/07/2016 and it was noted that he had venous incompetence in the right great saphenous vein and bilateral common femoral veins. Patient was seen by Dr. Tamala Julian on the same day and for some reason his notes do not reflect the venous studies or the arterial studies and he recommended patient do a venous duplex ultrasound to look for reflux and return to see him.he would also consider a lymph pump if required. The patient was told that his workup  was normal and hence the patient  canceled his follow-up appointment. 02/03/17 on evaluation today patient left medial lower extremity blister appears to be doing about the same. It is still continuing to drain and there's still the blistered skin covering the wound bed which is making it difficult for the alternate to do its job. Fortunately there is no evidence of cellulitis. No fevers chills noted. Patient states in general he is not having any significant discomfort. Patient's lower extremity arterial duplex exam revealed that patient was hemodynamically stable with no evidence of stenosis in regard to the bilateral lower extremities. The lower extremity venous reflux exam revealed the patient had venous incontinence noted in the right greater saphenous and bilateral common femoral vein. There is no evidence of deep or superficial vein thrombosis in the bilateral lower extremities. Readmission: 11/12/18 Patient presents for evaluation our clinic today concerning issues that he is having with his left lower extremity. He tells me that a couple weeks ago he began developing blisters on the left lower extremity along with increased swelling. He typically wears his compression stockings on a regular basis is previously been evaluated both here as well is with vascular surgery they would recommend lymphedema pumps but unfortunately that somehow fell through and he never heard anything back from that. Nonetheless I think lymphedema pumps would be beneficial for this patient. He does have a history of hypertension and diabetes. Obviously the chronic venous stasis and lymphedema as well. At this point the blisters have been given in more trouble he states sometimes when the blisters openings able to clean it down with alcohol and it will dry out and do well. Unfortunately that has not been the case this time. He is having some discomfort although this mean these with cleaning the areas he doesn't have discomfort just on a regular basis. He  has not been able to wear his compression stockings since the blisters arose due to the fact that of course it will drain into the socks causing additional issues and he didn't have any way to wrap this otherwise. He has increased to taking his Lasix every day instead of every other day. He sees his primary care provider later this month as well. No fevers, chills, nausea, or vomiting noted at this time. 11/19/18-Patient returns at 1 week, per intake RN the amount of seepage into the compression wraps was definitely improved, overall all the wounds are measuring smaller but continuing silver alginate to the wounds as primary dressing 11/26/18 on evaluation today patient appears to be doing quite well in regard to his left lower [place] ulcers. In fact of the areas that were noted initially he only has two regions still open. There is no evidence of active infection at this time. He still is not heard anything from the company regarding lymphedema pumps as of yet. Again as previously seen vascular they have not recommended any surgical intervention. 12/03/2018 on evaluation today patient actually appears to be doing quite well with regard to his lower extremity ulcers. In fact most of the areas appear to be healed the one spot which does not seem to be completely healed I am unsure of whether or not this is really draining that much but nonetheless there does not appear to be any signs of infection or significant drainage at this point. There is no sign of fever, chills, nausea, vomiting, or diarrhea. Overall I am pleased with how things have progressed I think is very close to being able to transition  to his home compression stockings. [name], [name] ([id number]) 12/10/2018 upon evaluation today patient appears to be doing quite well with regard to his left lower extremity. He has been tolerating the dressing changes without complication. Fortunately there is no signs of active infection at this time. He  appears after thorough evaluation of his leg to only have 1 small area that remains open at this point everything else appears to be almost completely closed. He still have significant swelling of the left lower extremity. We had discussed discussing this with his primary care provider he is not able to see her in person they were at the [facility] and right now the [place] is not seeing patients on site. According to the patient anyway. Subsequently he did speak with her apparently and his primary care provider feels that he may likely have a DVT. With that being said she has not seen his leg she is just going off of his history. Nonetheless that is a concern that the patient now has as well and while I do not feel the DVT is likely we can definitely ensure that that is not the case I will go ahead and see about putting that order in today. Nonetheless otherwise I am in a recommend that we continue with the current wound care measures including the compression therapy most likely. We just need to ensure that his leg is indeed free of any DVTs. 12/17/2018 on evaluation today patient actually appears to be completely healed today. He does have 2 very small areas of blistering although this is not anything too significant at this point which is good news. With that being said I am in agreement with the fact that I think he is completely healed at this point. He does want to get back into his compression stocking. The good news is we have gotten approval from insurance for his lymphedema pumps we received a letter since last saw him last week. The other good news is his study did come back and showed no evidence of a DVT. 12/20/2018 on evaluation today patient presents for follow-up concerning his ongoing issues with his left lower extremity. He was actually discharged last Friday and did fairly well until he states blisters opened this morning. He tells me he has been wearing his compression stocking although  he has a hard time getting this on. There does not appear to be any signs of active infection at this time. No fevers, chills, nausea, vomiting, or diarrhea. 12/27/2018 on evaluation today patient appears to be doing very well with regard to his swelling of the left lower extremity the 4 layer compression wrap seems to have been beneficial for him. Fortunately there is no signs of active infection at this time. Patient has been tolerating the compression wrap without complication and his foot swelling in particular appears to be greatly improved. He does still have a wound on the lateral portion of his left leg I believe this is more of a blister that has now reopened. 01/03/2019 on evaluation today patient actually appears to be doing excellent in regard to his left lower extremity. He did receive his compression pumps and is actually use this 7 times since he was last here in the office. On top of the compression wrap he is now roughly 3 cm better at the calf and 2 cm better at the ankle he also states that his foot seem to go an issue better without even having to use a shoe horn. Obviously I  think this is all evidence that he is doing excellent in this regard. The other good news is he does not appear to have anything open today as far as wounds are concerned. 01/15/2019 on evaluation today patient appears to be doing more poorly yet again with regard to his left lower extremity. He has developed new wounds again after being discharged just recently. Unfortunately this continues to be the case that he will heal and then have subsequent new wounds. The last time I was hopeful that he may not end up coming back too quickly especially since he states he has been using his lymphedema pumps along with wearing his compression. Nonetheless he had a blister on the back of his leg that popped up on the left and this has opened up into an ulceration it is quite painful. 01/22/19 on evaluation today patient  actually appears to be doing well with regard to his wound on the left lower extremity. He's been tolerating the dressing changes without complication including the compression wrap in the wound appears to be significantly smaller today which is great news. Overall very pleased in this regard. 01/29/2019 on evaluation today patient appears to be doing well with regard to his left posterior lower extremity ulcer. He has been tolerating the dressing changes without complication. This is not completely healed but is getting much closer. We did order a Farrow wrap 4000 for him he has received this and has it with him today although I am not sure we are quite ready to start him on that as of yet. We are very close. 02/05/2019 on evaluation today patient actually appears to be doing quite well with regard to his left posterior lower extremity ulcer. He still has a very tiny opening remaining but the fortunate thing is he seems to be healing quite nicely. He also did get his Farrow wrap which I am hoping will help with his edema control as well at home. Fortunately there is no evidence of active infection. 02/12/2019 patient and fortunately appears to be doing poorly in regard to his wounds of the left lower extremity. He was very close to healing therefore we attempted to use his Velcro compression wraps continuing with lymphedema pumps at home. Unfortunately that does not seem to have done very well for him. He tells me that he wore them all the time but again I am not sure why if that is the case that he is having such significant edema. He is still on his fluid pills as well. With that being said there is no obvious sign of infection although I do wonder about the possibility of infection at this time as well. 02/19/2019 unfortunately upon evaluation today patient appears to be doing more poorly with regard to his left lower extremity. He is not showing signs of significant improvement and I think the  biggest issue here is that he does have an infection that appears to likely be Pseudomonas. That is based on the blue-green drainage that were noted at this time. Unfortunately the antibiotic that has been on is not going to take care of this at all. I think they will get a need to switch him to either Levaquin or Cipro and this was discussed with the patient. 02/26/2019 on evaluation today patient's lower extremity on the left appears to be doing significantly better as compared to last evaluation. Fortunately there is no signs of active infection at this time. He has been tolerating the compression wrap without complication in  fact he made it the whole week at this point. He is showing signs of excellent improvement I am very happy in this regard. With that being said he is having some issues with infection we did review the results of his culture which I noted today. He did have a positive finding for Enterobacter as well as Alcaligenes faecalis. Fortunately the Levaquin that I placed him on will work for both which is great news. There is no signs of systemic infection at this point. 10/30; left posterior leg wound in the setting of very significant edema and what looks like chronic venous inflammation. He has compression pumps but does not use them. We have been using 3 layer compression. Silver alginate to the wound as the primary dressing 03/18/2019 on evaluation today patient appears to be doing a little better compared to last time I saw him. He really has not been using his compression pumps he tells me that he is having too much discomfort. He has been keeping his wraps on however. He is only been taking his fluid pills every other day because he states they are not really helping and he has an appointment with his primary care provider at the [facility] tomorrow. Subsequently the wound itself on the left lower extremity does seem to be greatly improved compared to previous. 03/25/2019 on evaluation  today patient appears to be doing better with regard to his wounds on the bilateral lower extremities. The left is doing excellent the right is also doing better although both still do show some signs of open wounds noted at this point unfortunately. Fortunately there is no signs of active infection at this time. The patient also is not really having any significant pain which is good news. Unfortunately there was some confusion with the referral on vascular disease and as far as getting the patient scheduled there can be contacting him later today to do this [name], [name] ([id number]) fortunately we got this straightened out. 04/01/2019 on evaluation today patient appears to be doing no fevers, chills, nausea, vomiting, or diarrhea. Excellent at this time with regard to his lower extremities. There does not appear to be any open wound at this point which is good news. Fortunately is also no signs of active infection at this time. Overall feel like the patient has done excellent with the compression the problem is every time we got him to this point and then subsequently go to using his own compression things just go right back to where they were. I am not sure how to address this we can try to get an appointment with vascular for 2 weeks now they have yet to call him. Obviously this has become frustrating for the patient as well. I think the issue has just been an honest error as far as scheduling is concerned but nonetheless still worn out the point where I am unsure of which direction we should take. 04/08/2019 on evaluation today patient actually appears to be doing well with regard to his lower extremities. There are no open wounds at this time and things seem to be managing quite nicely as far as the overall edema control is concerned. With that being said he does have his compression socks today for [place] to go ahead and reinitiate therapy in that manner at this point. He is going to be going for  shoes to be measured on Wednesday and then coincidentally he will also be seeing vascular on Thursday. Overall I think this is good news and  again I am hopeful that they will be able to do something for him to help prevent ongoing issues with edema control as well. No fevers, chills, nausea, vomiting, or diarrhea. 04/11/2019 on evaluation today patient actually appears to be doing poorly after just being discharged on Monday of this week. He had been experiencing issues with again blisters especially on the left lower extremity. With that being said he was completely healed and appeared to be doing great this past Monday. He then subsequently has new blisters that formed before his appointment with vascular this morning. He was also measured for shoes in the interim. With that being said we may have figured out what exactly is going on and why he continues to have issues like what we are seeing at this point. He takes his compression stockings off at nighttime and then he ends up having to sleep in his chair for 5-6 hours a night. He sleeps with his feet down he cannot really get him up in the recliner and therefore he is sleeping and the worst possible his position with his feet on the floor for that majority of the time. Again as I explained to him that is about one third at minimum at least one fourth of his day that he spending with his feet dangling down on the ground and the worst possible position they could be. I think this may be what is causing the issue. Subsequently I am leaning toward thinking that he may need a hospital bed in order to elevate his legs. We likely can have to coordinate this with his primary care provider at the [facility]. Readmission: 01/26/2021 this is a patient who presents for repeat evaluation here in the clinic although it is actually been couple of years since have seen him in fact it was December 2020 when I last saw him. Subsequently he never really healed but  did end up being lost to follow-up. He tells me has been having issues ongoing with his lower extremities has bilateral lower extremity lymphedema no real significant or definitive open wounds but in general his lymphedema is way out of control. We were never able to refer him to lymphedema clinic simply due to the fact to be honest we were never able to get him completely healed. I do not see anyone with open wounds. The patient does have evidence of type 2 diabetes mellitus, lymphedema, chronic venous insufficiency, and hypertension. That really has not changed since his last evaluation. 02/09/2021 upon evaluation today patient appears to be doing a little better in regard to his legs although he still having a tremendous amount of drainage especially on the left leg. Fortunately there does not appear to be any evidence of active infection. Of note when we looked into this further it appears that the patient did not have any absorptive dressing on it was just the 4-layer compression wrap. Nonetheless this is probably big part of the issue here. 10/10; he comes in today with 3 large areas on the upper right lower leg likely remanence of denuded blistering under his compression wraps. He has no other wounds on the right. On the left he has the denuded area on the left medial foot and ankle and on the left dorsal foot. Massive lymphedema in both feet dorsally. Using Zetuvit under compression We have increased home health visitation to twice a week to change the dressings and will change it once 02/22/2021 upon evaluation today patient appears to be doing well currently  with regard to his wounds. He has been tolerating the dressing changes without complication. Fortunately there does not appear to be any evidence of active infection which is great news. No fevers, chills, nausea, vomiting, or diarrhea. The biggest issue I see currently is that home health is not putting any medicine on the actual wounds  before wrapping. 03/01/2021 upon evaluation today the patient's right leg actually appears to be doing quite well which is great news there does not appear to be any evidence of active infection at this time. No fevers, chills, nausea, vomiting, or diarrhea. With that being said the patient is having issues on the left foot where he is having significant drainage is also an ammonia smell he does not have any animals at home and this makes me concerned about a bacteria producing urea as a byproduct. Again the possible common organisms will be E. coli, Proteus, and Enterococcus. All 3 of which can be successfully treated with Levaquin. For that reason I think that this may be a good option for [place] to consider placing him on and I did obtain a culture as well for confirmation sake. 03/08/2021 upon evaluation today patient appears to be doing unfortunately still somewhat poorly in regard to his leg ulcerations. He actually has an area on the right leg where he blistered due to the fact that his wrap slid down and caused an area of pinching on his skin and this has led to a significant issue here. 03/15/2021 upon evaluation today patient unfortunately has not been wrapped appropriately with absorptive dressings nor with the appropriate technique for the third layer of the 4-layer compression wrap. These are issues that we continue to try to address with the home health nurse. Also the absorptive dressing that she had was cut in half and therefore that causes things to leak out it does not actually trap the fluid in regard to the top of the foot overall I think that all these combined are really not seeing things improved significantly here. Fortunately there does not appear to be any signs of significant infection at this time which is good news. He still is having a tremendous amount of drainage. 03/22/2021 upon evaluation today patient appears to be draining tremendously. He still continues to tell me  that he is using his pumps 2 times a day and that coupled with that tells me that he is elevating his legs as well. With that being said all things considered I am really just not seeing the improvement we would expect to see with the 4-layer compression wrap and all the above noted. He in fact had an extremely large Zetuvit dressing on both legs and that they were extremely filled to the max with fluid. This is after just being changed just before the weekend and this is Monday. Nonetheless I am concerned about the fact that there is something going on fairly significant that we cannot get any of this under control and that he is draining this significantly. He supposed be having an echocardiogram it sounds like scheduling has been an issue for him as far as getting in sooner. Its something to do with needing his cousin to drive him because of where it sat and he cannot drive himself to this appointment either way I really think he needs to try to see what he can do about making this happen a little sooner. He tells me he will call today. [name], [name] ([id number]) 03/29/2021 on evaluation today patient appears to be  doing about the same in regard to his legs. He did get his cardiology appointment moved up to 6 December which is at least good that is better than what it was before mid December. Overall very pleased in that regard. Electronic Signature(s) Signed: 03/29/2021 10:42:49 AM By: Worthy Keeler PA-C Entered By: Worthy Keeler on 03/29/2021 10:42:49 [name] ([id number]) -------------------------------------------------------------------------------- Physical Exam Details Patient Name: [name] Date of Service: 03/29/2021 10:15 AM Medical Record Number: [id number] Patient Account Number: [id number] Date of Birth/Sex: May 18, 1950 (70 y.o. M) Treating RN: [name] Primary Care Provider: Felipa Eth Other Clinician: Referring Provider: Felipa Eth Treating  Provider/Extender: Skipper Cliche in Treatment: 8 Constitutional Well-nourished and well-hydrated in no acute distress. Respiratory normal breathing without difficulty. Psychiatric this patient is able to make decisions and demonstrates good insight into disease process. Alert and Oriented x 3. pleasant and cooperative. Notes Upon inspection patient's wound bed showed signs of good granulation and epithelization at this point. Fortunately I do not see any evidence of infection systemically nor locally which is great news. No fevers, chills, nausea, vomiting, or diarrhea. Electronic Signature(s) Signed: 03/29/2021 10:43:02 AM By: Worthy Keeler PA-C Entered By: Worthy Keeler on 03/29/2021 10:43:02 [name] ([id number]) -------------------------------------------------------------------------------- Physician Orders Details Patient Name: [name] Date of Service: 03/29/2021 10:15 AM Medical Record Number: [id number] Patient Account Number: [id number] Date of Birth/Sex: Oct 16, 1950 (70 y.o. M) Treating RN: [name] Primary Care Provider: Felipa Eth Other Clinician: Referring Provider: Felipa Eth Treating Provider/Extender: Skipper Cliche in Treatment: 8 Verbal / Phone Orders: No Diagnosis Coding ICD-10 Coding Code Description E11.622 Type 2 diabetes mellitus with other skin ulcer I89.0 Lymphedema, not elsewhere classified I87.2 Venous insufficiency (chronic) (peripheral) L97.822 Non-pressure chronic ulcer of other part of left lower leg with fat layer exposed L97.512 Non-pressure chronic ulcer of other part of right foot with fat layer exposed I10 Essential (primary) hypertension L97.811 Non-pressure chronic ulcer of other part of right lower leg limited to breakdown of skin Follow-up Appointments o Return Appointment in 1 week. [place] for wound care. May utilize formulary equivalent dressing for wound treatment  orders unless otherwise specified. Home Health Nurse may visit PRN to address patientos wound care needs. - frequency 3 times per week - patient will be seen once at wound center - home health to see patient 2 times per week on Wed and friday Bathing/ Shower/ Hygiene o May shower with wound dressing protected with water repellent cover or cast protector. Edema Control - Lymphedema / Segmental Compressive Device / Other Bilateral Lower Extremities o Optional: One layer of unna paste to top of compression wrap (to act as an anchor). - Unna paste on feet and calf to secure wrap in place o 4 Layer Compression System Lymphedema. - bi lat, apply zetuvits/ or formulary for extra absorbant dressing around ankles and foot area, 3 times per week COTTON LAYER SPIRAL, LIGHT TAN SPIRAL. WHITE WITH YELLOW LINE FIGURE 8 , COBAN SPIRAL o Elevate, Exercise Daily and Avoid Standing for Long Periods of Time. o Elevate legs to the level of the heart and pump ankles as often as possible o Elevate leg(s) parallel to the floor when sitting. o Compression Pump: Use compression pump on left lower extremity for 60 minutes, twice daily. - Start today o Compression Pump: Use compression pump on right lower extremity for 60 minutes, twice daily. - Start today o DO YOUR BEST to sleep in the bed at night. DO  NOT sleep in your recliner. Long hours of sitting in a recliner leads to swelling of the legs and/or potential wounds on your backside. o Other: - Contact prescriber regarding use of diuretics to reduce fluid overload. Off-Loading Bilateral Lower Extremities o Open toe surgical shoe Wound Treatment Wound #15 - Lower Leg Wound Laterality: Left, Medial Secondary Dressing: Zetuvit Plus Silicone Non-bordered 5x5 (in/in) 3 x Per Week/30 Days Discharge Instructions: please do not cut zetuvit as they are not made to be cut causes leak and release fluid collected Compression Wrap: Medichoice 4 layer  Compression System, 35-40 mmHG (Generic) 3 x Per Week/30 Days Discharge Instructions: Apply multi-layer wrap as directed. Wound #16 - Lower Leg Wound Laterality: Right, Posterior Primary Dressing: Silvercel 4 1/4x 4 1/4 (in/in) 3 x Per Week/30 Days Discharge Instructions: Apply Silvercel 4 1/4x 4 1/4 (in/in) as instructed [name], [name] ([id number]) Secondary Dressing: Zetuvit Plus Silicone Non-bordered 5x5 (in/in) 3 x Per Week/30 Days Discharge Instructions: please do not cut zetuvit as they are not made to be cut causes leak and release fluid collected Compression Wrap: Medichoice 4 layer Compression System, 35-40 mmHG (Generic) 3 x Per Week/30 Days Discharge Instructions: Apply multi-layer wrap as directed. Electronic Signature(s) Signed: 03/29/2021 5:26:28 PM By: Worthy Keeler PA-C Signed: 04/14/2021 1:44:34 PM By: [name] RN Entered By: [name] on 03/29/2021 10:41:41 [name] ([id number]) -------------------------------------------------------------------------------- Problem List Details Patient Name: [name] Date of Service: 03/29/2021 10:15 AM Medical Record Number: [id number] Patient Account Number: [id number] Date of Birth/Sex: Dec 13, 1950 (70 y.o. M) Treating RN: [name] Primary Care Provider: Felipa Eth Other Clinician: Referring Provider: Felipa Eth Treating Provider/Extender: Skipper Cliche in Treatment: 8 Active Problems ICD-10 Encounter Code Description Active Date MDM Diagnosis E11.622 Type 2 diabetes mellitus with other skin ulcer 01/26/2021 No Yes I89.0 Lymphedema, not elsewhere classified 01/26/2021 No Yes I87.2 Venous insufficiency (chronic) (peripheral) 01/26/2021 No Yes L97.822 Non-pressure chronic ulcer of other part of left lower leg with fat layer 01/26/2021 No Yes exposed L97.512 Non-pressure chronic ulcer of other part of right foot with fat layer 01/26/2021 No Yes exposed [place] (primary) hypertension 01/26/2021  No Yes L97.811 Non-pressure chronic ulcer of other part of right lower leg limited to 02/15/2021 No Yes breakdown of skin Inactive Problems Resolved Problems Electronic Signature(s) Signed: 03/29/2021 10:20:18 AM By: Worthy Keeler PA-C Entered By: Worthy Keeler on 03/29/2021 10:20:17 [name] ([id number]) -------------------------------------------------------------------------------- Progress Note Details Patient Name: [name] Date of Service: 03/29/2021 10:15 AM Medical Record Number: [id number] Patient Account Number: [id number] Date of Birth/Sex: 1951/02/23 (70 y.o. M) Treating RN: [name] Primary Care Provider: Felipa Eth Other Clinician: Referring Provider: Felipa Eth Treating Provider/Extender: Skipper Cliche in Treatment: 8 Subjective Chief Complaint Information obtained from Patient Left LE ulcers History of Present Illness (HPI) 70 year old male who presented to the ER with bilateral lower extremity blisters which had started last week. he has a past medical history of leukemia, diabetes mellitus, hypertension, edema of both lower extremities, his recurrent skin infections, peripheral vascular disease, coronary artery disease, congestive heart failure and peripheral neuropathy. in the ER he was given Rocephin and put on Silvadene cream. he was put on oral doxycycline and was asked to follow-up with the [facility]. His last hemoglobin A1c was 6.6 in December and he checks his blood sugar once a week. He does not have any physicians outside the [place] system. He does not recall any vascular duplex studies done either for arterial or venous disease but was  told to wear compression stockings which he does not use 05/30/2016 -- we have not yet received any of his notes from the [facility] hospital system and his arterial and venous duplex studies are scheduled here in [place] around mid February. We are unable to have his insurance accepted by home health  agencies and hence he is getting dressings only once a week. 06/06/16 -- -- I received a call from the patient's PCP at the [facility] at [facility] and spoke to Dr. Garvin Fila, phone number [phone] and fax number [phone]. She confirmed that no vascular testing was done over the last 5 years and she would be happy to do them if the patient did want them to be done at the [place] and we could fax him a request. Readmission: 70 year old male seen by as in February of this year and was referred to vein and vascular for studies and opinion from the vascular surgeons. The patient returns today with a fresh problem having had blisters on his left lower extremity which have been there for about 5 days and he clearly states that he has been wearing his compression stockings as advised though he could not read the moderate compression and has been wearing light compression. Review of his electronic medical records note that he had lower extremity arterial duplex examination done on 06/23/2016 which showed no hemodynamically significant stenosis in the bilateral lower extremity arterial system. He also had a lower extremity venous reflux examination done on 07/07/2016 and it was noted that he had venous incompetence in the right great saphenous vein and bilateral common femoral veins. Patient was seen by Dr. Tamala Julian on the same day and for some reason his notes do not reflect the venous studies or the arterial studies and he recommended patient do a venous duplex ultrasound to look for reflux and return to see him.he would also consider a lymph pump if required. The patient was told that his workup was normal and hence the patient canceled his follow-up appointment. 02/03/17 on evaluation today patient left medial lower extremity blister appears to be doing about the same. It is still continuing to drain and there's still the blistered skin covering the wound bed which is making it difficult for the alternate to  do its job. Fortunately there is no evidence of cellulitis. No fevers chills noted. Patient states in general he is not having any significant discomfort. Patient's lower extremity arterial duplex exam revealed that patient was hemodynamically stable with no evidence of stenosis in regard to the bilateral lower extremities. The lower extremity venous reflux exam revealed the patient had venous incontinence noted in the right greater saphenous and bilateral common femoral vein. There is no evidence of deep or superficial vein thrombosis in the bilateral lower extremities. Readmission: 11/12/18 Patient presents for evaluation our clinic today concerning issues that he is having with his left lower extremity. He tells me that a couple weeks ago he began developing blisters on the left lower extremity along with increased swelling. He typically wears his compression stockings on a regular basis is previously been evaluated both here as well is with vascular surgery they would recommend lymphedema pumps but unfortunately that somehow fell through and he never heard anything back from that. Nonetheless I think lymphedema pumps would be beneficial for this patient. He does have a history of hypertension and diabetes. Obviously the chronic venous stasis and lymphedema as well. At this point the blisters have been given in more trouble he states sometimes when the  blisters openings able to clean it down with alcohol and it will dry out and do well. Unfortunately that has not been the case this time. He is having some discomfort although this mean these with cleaning the areas he doesn't have discomfort just on a regular basis. He has not been able to wear his compression stockings since the blisters arose due to the fact that of course it will drain into the socks causing additional issues and he didn't have any way to wrap this otherwise. He has increased to taking his Lasix every day instead of every other  day. He sees his primary care provider later this month as well. No fevers, chills, nausea, or vomiting noted at this time. 11/19/18-Patient returns at 1 week, per intake RN the amount of seepage into the compression wraps was definitely improved, overall all the wounds are measuring smaller but continuing silver alginate to the wounds as primary dressing 11/26/18 on evaluation today patient appears to be doing quite well in regard to his left lower [place] ulcers. In fact of the areas that were noted initially he only has two regions still open. There is no evidence of active infection at this time. He still is not heard anything from the company regarding lymphedema pumps as of yet. Again as previously seen vascular they have not recommended any surgical intervention. SAMMUEL, BLICK ([id number]) 12/03/2018 on evaluation today patient actually appears to be doing quite well with regard to his lower extremity ulcers. In fact most of the areas appear to be healed the one spot which does not seem to be completely healed I am unsure of whether or not this is really draining that much but nonetheless there does not appear to be any signs of infection or significant drainage at this point. There is no sign of fever, chills, nausea, vomiting, or diarrhea. Overall I am pleased with how things have progressed I think is very close to being able to transition to his home compression stockings. 12/10/2018 upon evaluation today patient appears to be doing quite well with regard to his left lower extremity. He has been tolerating the dressing changes without complication. Fortunately there is no signs of active infection at this time. He appears after thorough evaluation of his leg to only have 1 small area that remains open at this point everything else appears to be almost completely closed. He still have significant swelling of the left lower extremity. We had discussed discussing this with his primary care provider  he is not able to see her in person they were at the [facility] and right now the [place] is not seeing patients on site. According to the patient anyway. Subsequently he did speak with her apparently and his primary care provider feels that he may likely have a DVT. With that being said she has not seen his leg she is just going off of his history. Nonetheless that is a concern that the patient now has as well and while I do not feel the DVT is likely we can definitely ensure that that is not the case I will go ahead and see about putting that order in today. Nonetheless otherwise I am in a recommend that we continue with the current wound care measures including the compression therapy most likely. We just need to ensure that his leg is indeed free of any DVTs. 12/17/2018 on evaluation today patient actually appears to be completely healed today. He does have 2 very small areas of blistering although this  is not anything too significant at this point which is good news. With that being said I am in agreement with the fact that I think he is completely healed at this point. He does want to get back into his compression stocking. The good news is we have gotten approval from insurance for his lymphedema pumps we received a letter since last saw him last week. The other good news is his study did come back and showed no evidence of a DVT. 12/20/2018 on evaluation today patient presents for follow-up concerning his ongoing issues with his left lower extremity. He was actually discharged last Friday and did fairly well until he states blisters opened this morning. He tells me he has been wearing his compression stocking although he has a hard time getting this on. There does not appear to be any signs of active infection at this time. No fevers, chills, nausea, vomiting, or diarrhea. 12/27/2018 on evaluation today patient appears to be doing very well with regard to his swelling of the left lower extremity the  4 layer compression wrap seems to have been beneficial for him. Fortunately there is no signs of active infection at this time. Patient has been tolerating the compression wrap without complication and his foot swelling in particular appears to be greatly improved. He does still have a wound on the lateral portion of his left leg I believe this is more of a blister that has now reopened. 01/03/2019 on evaluation today patient actually appears to be doing excellent in regard to his left lower extremity. He did receive his compression pumps and is actually use this 7 times since he was last here in the office. On top of the compression wrap he is now roughly 3 cm better at the calf and 2 cm better at the ankle he also states that his foot seem to go an issue better without even having to use a shoe horn. Obviously I think this is all evidence that he is doing excellent in this regard. The other good news is he does not appear to have anything open today as far as wounds are concerned. 01/15/2019 on evaluation today patient appears to be doing more poorly yet again with regard to his left lower extremity. He has developed new wounds again after being discharged just recently. Unfortunately this continues to be the case that he will heal and then have subsequent new wounds. The last time I was hopeful that he may not end up coming back too quickly especially since he states he has been using his lymphedema pumps along with wearing his compression. Nonetheless he had a blister on the back of his leg that popped up on the left and this has opened up into an ulceration it is quite painful. 01/22/19 on evaluation today patient actually appears to be doing well with regard to his wound on the left lower extremity. He's been tolerating the dressing changes without complication including the compression wrap in the wound appears to be significantly smaller today which is great news. Overall very pleased in this  regard. 01/29/2019 on evaluation today patient appears to be doing well with regard to his left posterior lower extremity ulcer. He has been tolerating the dressing changes without complication. This is not completely healed but is getting much closer. We did order a Farrow wrap 4000 for him he has received this and has it with him today although I am not sure we are quite ready to start him on that as  of yet. We are very close. 02/05/2019 on evaluation today patient actually appears to be doing quite well with regard to his left posterior lower extremity ulcer. He still has a very tiny opening remaining but the fortunate thing is he seems to be healing quite nicely. He also did get his Farrow wrap which I am hoping will help with his edema control as well at home. Fortunately there is no evidence of active infection. 02/12/2019 patient and fortunately appears to be doing poorly in regard to his wounds of the left lower extremity. He was very close to healing therefore we attempted to use his Velcro compression wraps continuing with lymphedema pumps at home. Unfortunately that does not seem to have done very well for him. He tells me that he wore them all the time but again I am not sure why if that is the case that he is having such significant edema. He is still on his fluid pills as well. With that being said there is no obvious sign of infection although I do wonder about the possibility of infection at this time as well. 02/19/2019 unfortunately upon evaluation today patient appears to be doing more poorly with regard to his left lower extremity. He is not showing signs of significant improvement and I think the biggest issue here is that he does have an infection that appears to likely be Pseudomonas. That is based on the blue-green drainage that were noted at this time. Unfortunately the antibiotic that has been on is not going to take care of this at all. I think they will get a need to switch  him to either Levaquin or Cipro and this was discussed with the patient. 02/26/2019 on evaluation today patient's lower extremity on the left appears to be doing significantly better as compared to last evaluation. Fortunately there is no signs of active infection at this time. He has been tolerating the compression wrap without complication in fact he made it the whole week at this point. He is showing signs of excellent improvement I am very happy in this regard. With that being said he is having some issues with infection we did review the results of his culture which I noted today. He did have a positive finding for Enterobacter as well as Alcaligenes faecalis. Fortunately the Levaquin that I placed him on will work for both which is great news. There is no signs of systemic infection at this point. 10/30; left posterior leg wound in the setting of very significant edema and what looks like chronic venous inflammation. He has compression pumps but does not use them. We have been using 3 layer compression. Silver alginate to the wound as the primary dressing 03/18/2019 on evaluation today patient appears to be doing a little better compared to last time I saw him. He really has not been using his compression pumps he tells me that he is having too much discomfort. He has been keeping his wraps on however. He is only been taking his fluid pills every other day because he states they are not really helping and he has an appointment with his primary care provider at the [facility] tomorrow. Subsequently the wound itself on the left lower extremity does seem to be greatly improved compared to previous. [name], [name] ([id number]) 03/25/2019 on evaluation today patient appears to be doing better with regard to his wounds on the bilateral lower extremities. The left is doing excellent the right is also doing better although both still do show some  signs of open wounds noted at this point unfortunately. Fortunately  there is no signs of active infection at this time. The patient also is not really having any significant pain which is good news. Unfortunately there was some confusion with the referral on vascular disease and as far as getting the patient scheduled there can be contacting him later today to do this fortunately we got this straightened out. 04/01/2019 on evaluation today patient appears to be doing no fevers, chills, nausea, vomiting, or diarrhea. Excellent at this time with regard to his lower extremities. There does not appear to be any open wound at this point which is good news. Fortunately is also no signs of active infection at this time. Overall feel like the patient has done excellent with the compression the problem is every time we got him to this point and then subsequently go to using his own compression things just go right back to where they were. I am not sure how to address this we can try to get an appointment with vascular for 2 weeks now they have yet to call him. Obviously this has become frustrating for the patient as well. I think the issue has just been an honest error as far as scheduling is concerned but nonetheless still worn out the point where I am unsure of which direction we should take. 04/08/2019 on evaluation today patient actually appears to be doing well with regard to his lower extremities. There are no open wounds at this time and things seem to be managing quite nicely as far as the overall edema control is concerned. With that being said he does have his compression socks today for [place] to go ahead and reinitiate therapy in that manner at this point. He is going to be going for shoes to be measured on Wednesday and then coincidentally he will also be seeing vascular on Thursday. Overall I think this is good news and again I am hopeful that they will be able to do something for him to help prevent ongoing issues with edema control as well. No fevers, chills,  nausea, vomiting, or diarrhea. 04/11/2019 on evaluation today patient actually appears to be doing poorly after just being discharged on Monday of this week. He had been experiencing issues with again blisters especially on the left lower extremity. With that being said he was completely healed and appeared to be doing great this past Monday. He then subsequently has new blisters that formed before his appointment with vascular this morning. He was also measured for shoes in the interim. With that being said we may have figured out what exactly is going on and why he continues to have issues like what we are seeing at this point. He takes his compression stockings off at nighttime and then he ends up having to sleep in his chair for 5-6 hours a night. He sleeps with his feet down he cannot really get him up in the recliner and therefore he is sleeping and the worst possible his position with his feet on the floor for that majority of the time. Again as I explained to him that is about one third at minimum at least one fourth of his day that he spending with his feet dangling down on the ground and the worst possible position they could be. I think this may be what is causing the issue. Subsequently I am leaning toward thinking that he may need a hospital bed in order to elevate his legs. We likely  can have to coordinate this with his primary care provider at the [facility]. Readmission: 01/26/2021 this is a patient who presents for repeat evaluation here in the clinic although it is actually been couple of years since have seen him in fact it was December 2020 when I last saw him. Subsequently he never really healed but did end up being lost to follow-up. He tells me has been having issues ongoing with his lower extremities has bilateral lower extremity lymphedema no real significant or definitive open wounds but in general his lymphedema is way out of control. We were never able to refer him to  lymphedema clinic simply due to the fact to be honest we were never able to get him completely healed. I do not see anyone with open wounds. The patient does have evidence of type 2 diabetes mellitus, lymphedema, chronic venous insufficiency, and hypertension. That really has not changed since his last evaluation. 02/09/2021 upon evaluation today patient appears to be doing a little better in regard to his legs although he still having a tremendous amount of drainage especially on the left leg. Fortunately there does not appear to be any evidence of active infection. Of note when we looked into this further it appears that the patient did not have any absorptive dressing on it was just the 4-layer compression wrap. Nonetheless this is probably big part of the issue here. 10/10; he comes in today with 3 large areas on the upper right lower leg likely remanence of denuded blistering under his compression wraps. He has no other wounds on the right. On the left he has the denuded area on the left medial foot and ankle and on the left dorsal foot. Massive lymphedema in both feet dorsally. Using Zetuvit under compression We have increased home health visitation to twice a week to change the dressings and will change it once 02/22/2021 upon evaluation today patient appears to be doing well currently with regard to his wounds. He has been tolerating the dressing changes without complication. Fortunately there does not appear to be any evidence of active infection which is great news. No fevers, chills, nausea, vomiting, or diarrhea. The biggest issue I see currently is that home health is not putting any medicine on the actual wounds before wrapping. 03/01/2021 upon evaluation today the patient's right leg actually appears to be doing quite well which is great news there does not appear to be any evidence of active infection at this time. No fevers, chills, nausea, vomiting, or diarrhea. With that being said  the patient is having issues on the left foot where he is having significant drainage is also an ammonia smell he does not have any animals at home and this makes me concerned about a bacteria producing urea as a byproduct. Again the possible common organisms will be E. coli, Proteus, and Enterococcus. All 3 of which can be successfully treated with Levaquin. For that reason I think that this may be a good option for [place] to consider placing him on and I did obtain a culture as well for confirmation sake. 03/08/2021 upon evaluation today patient appears to be doing unfortunately still somewhat poorly in regard to his leg ulcerations. He actually has an area on the right leg where he blistered due to the fact that his wrap slid down and caused an area of pinching on his skin and this has led to a significant issue here. 03/15/2021 upon evaluation today patient unfortunately has not been wrapped appropriately with absorptive  dressings nor with the appropriate technique for the third layer of the 4-layer compression wrap. These are issues that we continue to try to address with the home health nurse. Also the absorptive dressing that she had was cut in half and therefore that causes things to leak out it does not actually trap the fluid in regard to the top of the foot overall I think that all these combined are really not seeing things improved significantly here. Fortunately there does not appear to be any signs of significant infection at this time which is good news. He still is having a tremendous amount of drainage. 03/22/2021 upon evaluation today patient appears to be draining tremendously. He still continues to tell me that he is using his pumps 2 times a day and that coupled with that tells me that he is elevating his legs as well. With that being said all things considered I am really just not seeing the improvement we would expect to see with the 4-layer compression wrap and all the above noted.  He in fact had an extremely large Zetuvit dressing on both legs and that they were extremely filled to the max with fluid. This is after just being changed just before the weekend and this [name], [name] ([id number]) is Monday. Nonetheless I am concerned about the fact that there is something going on fairly significant that we cannot get any of this under control and that he is draining this significantly. He supposed be having an echocardiogram it sounds like scheduling has been an issue for him as far as getting in sooner. Its something to do with needing his cousin to drive him because of where it sat and he cannot drive himself to this appointment either way I really think he needs to try to see what he can do about making this happen a little sooner. He tells me he will call today. 03/29/2021 on evaluation today patient appears to be doing about the same in regard to his legs. He did get his cardiology appointment moved up to 6 December which is at least good that is better than what it was before mid December. Overall very pleased in that regard. Objective Constitutional Well-nourished and well-hydrated in no acute distress. Vitals Time Taken: 10:08 AM, Height: 73 in, Weight: 312 lbs, BMI: 41.2, Temperature: 97.9 F, Pulse: 81 bpm, Respiratory Rate: 18 breaths/min, Blood Pressure: 144/83 mmHg. Respiratory normal breathing without difficulty. Psychiatric this patient is able to make decisions and demonstrates good insight into disease process. Alert and Oriented x 3. pleasant and cooperative. General Notes: Upon inspection patient's wound bed showed signs of good granulation and epithelization at this point. Fortunately I do not see any evidence of infection systemically nor locally which is great news. No fevers, chills, nausea, vomiting, or diarrhea. Integumentary (Hair, Skin) Wound #15 status is Open. Original cause of wound was Blister. The date acquired was: 03/08/2021. The wound has  been in treatment 3 weeks. The wound is located on the Left,Medial Lower Leg. The wound measures 16cm length x 27cm width x 0.2cm depth; 339.292cm^2 area and 67.858cm^3 volume. There is Fat Layer (Subcutaneous Tissue) exposed. There is no tunneling or undermining noted. There is a large amount of serous drainage noted. There is medium (34-66%) pink granulation within the wound bed. There is a medium (34-66%) amount of necrotic tissue within the wound bed including Adherent Slough. Wound #16 status is Open. Original cause of wound was Blister. The date acquired was: 03/26/2021. The wound is located  on the Right,Posterior Lower Leg. The wound measures 7cm length x 8cm width x 0.1cm depth; 43.982cm^2 area and 4.398cm^3 volume. There is Fat Layer (Subcutaneous Tissue) exposed. There is no tunneling or undermining noted. There is a medium amount of serosanguineous drainage noted. There is large (67-100%) red granulation within the wound bed. There is no necrotic tissue within the wound bed. Assessment Active Problems ICD-10 Type 2 diabetes mellitus with other skin ulcer Lymphedema, not elsewhere classified Venous insufficiency (chronic) (peripheral) Non-pressure chronic ulcer of other part of left lower leg with fat layer exposed Non-pressure chronic ulcer of other part of right foot with fat layer exposed Essential (primary) hypertension Non-pressure chronic ulcer of other part of right lower leg limited to breakdown of skin Procedures Wound #15 Pre-procedure diagnosis of Wound #15 is a Lymphedema located on the Left,Medial Lower Leg . There was a Four Layer Compression Therapy Procedure by [name], RN. Post procedure Diagnosis Wound #15: Same as Pre-Procedure [name], [name] ([id number]) Wound #16 Pre-procedure diagnosis of Wound #16 is a Diabetic Wound/Ulcer of the Lower Extremity located on the Right,Posterior Lower Leg . There was a Four Layer Compression Therapy Procedure by [name], RN. Post procedure Diagnosis Wound #16: Same as Pre-Procedure Plan Follow-up Appointments: Return Appointment in 1 week. Home Health: [facility] for wound care. May utilize formulary equivalent dressing for wound treatment orders unless otherwise specified. Home Health Nurse may visit PRN to address patient s wound care needs. - frequency 3 times per week - patient will be seen once at wound center - home health to see patient 2 times per week on Wed and friday Bathing/ Shower/ Hygiene: May shower with wound dressing protected with water repellent cover or cast protector. Edema Control - Lymphedema / Segmental Compressive Device / Other: Optional: One layer of unna paste to top of compression wrap (to act as an anchor). - Unna paste on feet and calf to secure wrap in place 4 Layer Compression System Lymphedema. - bi lat, apply zetuvits/ or formulary for extra absorbant dressing around ankles and foot area, 3 times per week COTTON LAYER SPIRAL, LIGHT TAN SPIRAL. WHITE WITH YELLOW LINE FIGURE 8 , COBAN SPIRAL Elevate, Exercise Daily and Avoid Standing for Long Periods of Time. Elevate legs to the level of the heart and pump ankles as often as possible Elevate leg(s) parallel to the floor when sitting. Compression Pump: Use compression pump on left lower extremity for 60 minutes, twice daily. - Start today Compression Pump: Use compression pump on right lower extremity for 60 minutes, twice daily. - Start today DO YOUR BEST to sleep in the bed at night. DO NOT sleep in your recliner. Long hours of sitting in a recliner leads to swelling of the legs and/or potential wounds on your backside. Other: - Contact prescriber regarding use of diuretics to reduce fluid overload. Off-Loading: Open toe surgical shoe WOUND #15: - Lower Leg Wound Laterality: Left, Medial Secondary Dressing: Zetuvit Plus Silicone Non-bordered 5x5 (in/in) 3 x Per Week/30 Days Discharge Instructions: please  do not cut zetuvit as they are not made to be cut causes leak and release fluid collected Compression Wrap: Medichoice 4 layer Compression System, 35-40 mmHG (Generic) 3 x Per Week/30 Days Discharge Instructions: Apply multi-layer wrap as directed. WOUND #16: - Lower Leg Wound Laterality: Right, Posterior Primary Dressing: Silvercel 4 1/4x 4 1/4 (in/in) 3 x Per Week/30 Days Discharge Instructions: Apply Silvercel 4 1/4x 4 1/4 (in/in) as instructed Secondary Dressing: Zetuvit Plus Silicone  Non-bordered 5x5 (in/in) 3 x Per Week/30 Days Discharge Instructions: please do not cut zetuvit as they are not made to be cut causes leak and release fluid collected Compression Wrap: Medichoice 4 layer Compression System, 35-40 mmHG (Generic) 3 x Per Week/30 Days Discharge Instructions: Apply multi-layer wrap as directed. 1. Would recommend that we go ahead and continue with the wound care measures as before and the patient is in agreement the plan. This includes the use of the Zetuvit which were using over the majority the areas here. 2. I am also can recommend that we go ahead and utilize the silver cell for the new wound on the back of the right leg this is a more distinct wound itself. 3. I am also can recommend that we continue with a 4-layer compression wrap bilaterally which is doing decently well controlled the swelling although he still has a tremendous amount of weeping unfortunately. We will see patient back for reevaluation in 1 week here in the clinic. If anything worsens or changes patient will contact our office for additional recommendations. Electronic Signature(s) Signed: 03/29/2021 10:44:00 AM By: Worthy Keeler PA-C Entered By: Worthy Keeler on 03/29/2021 10:43:59 [name] ([id number]) -------------------------------------------------------------------------------- SuperBill Details Patient Name: [name] Date of Service: 03/29/2021 Medical Record Number:  [id number] Patient Account Number: [id number] Date of Birth/Sex: April 10, 1951 (70 y.o. M) Treating RN: [name] Primary Care Provider: Felipa Eth Other Clinician: Referring Provider: Felipa Eth Treating Provider/Extender: Skipper Cliche in Treatment: 8 Diagnosis Coding ICD-10 Codes Code Description E11.622 Type 2 diabetes mellitus with other skin ulcer I89.0 Lymphedema, not elsewhere classified I87.2 Venous insufficiency (chronic) (peripheral) L97.822 Non-pressure chronic ulcer of other part of left lower leg with fat layer exposed L97.512 Non-pressure chronic ulcer of other part of right foot with fat layer exposed I10 Essential (primary) hypertension L97.811 Non-pressure chronic ulcer of other part of right lower leg limited to breakdown of skin Facility Procedures CPT4: Description Modifier Quantity Code [id number] BILATERAL: Application of multi-layer venous compression system; leg (below knee), including 1 ankle and foot. Physician Procedures CPT4 Code: 7846962 Description: 95284 - WC PHYS LEVEL 4 - EST PT Modifier: Quantity: 1 CPT4 Code: Description: ICD-10 Diagnosis Description E11.622 Type 2 diabetes mellitus with other skin ulcer I89.0 Lymphedema, not elsewhere classified I87.2 Venous insufficiency (chronic) (peripheral) L97.822 Non-pressure chronic ulcer of other part of left lower  leg with fat layer Modifier: exposed Quantity: Electronic Signature(s) Signed: 03/29/2021 11:34:28 AM By: [name] RN Signed: 03/29/2021 5:26:28 PM By: Worthy Keeler PA-C Previous Signature: 03/29/2021 10:44:34 AM Version By: Worthy Keeler PA-C Entered By: [name] on 03/29/2021 11:34:28

## 2021-04-05 ENCOUNTER — Other Ambulatory Visit: Payer: Self-pay

## 2021-04-05 ENCOUNTER — Encounter: Payer: No Typology Code available for payment source | Admitting: Physician Assistant

## 2021-04-05 DIAGNOSIS — I872 Venous insufficiency (chronic) (peripheral): Secondary | ICD-10-CM | POA: Diagnosis not present

## 2021-04-05 DIAGNOSIS — I10 Essential (primary) hypertension: Secondary | ICD-10-CM | POA: Diagnosis not present

## 2021-04-05 DIAGNOSIS — L97822 Non-pressure chronic ulcer of other part of left lower leg with fat layer exposed: Secondary | ICD-10-CM | POA: Diagnosis not present

## 2021-04-05 DIAGNOSIS — E11622 Type 2 diabetes mellitus with other skin ulcer: Secondary | ICD-10-CM | POA: Diagnosis not present

## 2021-04-05 DIAGNOSIS — I890 Lymphedema, not elsewhere classified: Secondary | ICD-10-CM | POA: Diagnosis not present

## 2021-04-05 NOTE — Progress Notes (Addendum)
[name], [name] ([id number]) Visit Report for 04/05/2021 Chief Complaint Document Details Patient Name: [name], [name] Date of Service: 04/05/2021 10:15 AM Medical Record Number: [id number] Patient Account Number: [id number] Date of Birth/Sex: 11/08/1950 (70 y.o. M) Treating RN: [name] Primary Care Provider: Felipa Eth Other Clinician: Referring Provider: Felipa Eth Treating Provider/Extender: Skipper Cliche in Treatment: 9 Information Obtained from: Patient Chief Complaint Left LE ulcers Electronic Signature(s) Signed: 04/05/2021 10:39:06 AM By: Worthy Keeler PA-C Entered By: Worthy Keeler on 04/05/2021 10:39:05 [name] ([id number]) -------------------------------------------------------------------------------- HPI Details Patient Name: [name] Date of Service: 04/05/2021 10:15 AM Medical Record Number: [id number] Patient Account Number: [id number] Date of Birth/Sex: 1950/12/12 (70 y.o. M) Treating RN: [name] Primary Care Provider: Felipa Eth Other Clinician: Referring Provider: Felipa Eth Treating Provider/Extender: Skipper Cliche in Treatment: 9 History of Present Illness HPI Description: 70 year old male who presented to the ER with bilateral lower extremity blisters which had started last week. he has a past medical history of leukemia, diabetes mellitus, hypertension, edema of both lower extremities, his recurrent skin infections, peripheral vascular disease, coronary artery disease, congestive heart failure and peripheral neuropathy. in the ER he was given Rocephin and put on Silvadene cream. he was put on oral doxycycline and was asked to follow-up with the [facility]. His last hemoglobin A1c was 6.6 in December and he checks his blood sugar once a week. He does not have any physicians outside the [place] system. He does not recall any vascular duplex studies done either for arterial or venous disease but was told to wear  compression stockings which he does not use 05/30/2016 -- we have not yet received any of his notes from the [facility] hospital system and his arterial and venous duplex studies are scheduled here in [place] around mid February. We are unable to have his insurance accepted by home health agencies and hence he is getting dressings only once a week. 06/06/16 -- -- I received a call from the patient's PCP at the [facility] at [facility] and spoke to Dr. Garvin Fila, phone number [phone] and fax number [phone]. She confirmed that no vascular testing was done over the last 5 years and she would be happy to do them if the patient did want them to be done at the [place] and we could fax him a request. Readmission: 70 year old male seen by as in February of this year and was referred to vein and vascular for studies and opinion from the vascular surgeons. The patient returns today with a fresh problem having had blisters on his left lower extremity which have been there for about 5 days and he clearly states that he has been wearing his compression stockings as advised though he could not read the moderate compression and has been wearing light compression. Review of his electronic medical records note that he had lower extremity arterial duplex examination done on 06/23/2016 which showed no hemodynamically significant stenosis in the bilateral lower extremity arterial system. He also had a lower extremity venous reflux examination done on 07/07/2016 and it was noted that he had venous incompetence in the right great saphenous vein and bilateral common femoral veins. Patient was seen by Dr. Tamala Julian on the same day and for some reason his notes do not reflect the venous studies or the arterial studies and he recommended patient do a venous duplex ultrasound to look for reflux and return to see him.he would also consider a lymph pump if required. The patient was told that his workup  was normal and hence the patient  canceled his follow-up appointment. 02/03/17 on evaluation today patient left medial lower extremity blister appears to be doing about the same. It is still continuing to drain and there's still the blistered skin covering the wound bed which is making it difficult for the alternate to do its job. Fortunately there is no evidence of cellulitis. No fevers chills noted. Patient states in general he is not having any significant discomfort. Patient's lower extremity arterial duplex exam revealed that patient was hemodynamically stable with no evidence of stenosis in regard to the bilateral lower extremities. The lower extremity venous reflux exam revealed the patient had venous incontinence noted in the right greater saphenous and bilateral common femoral vein. There is no evidence of deep or superficial vein thrombosis in the bilateral lower extremities. Readmission: 70/6/20 Patient presents for evaluation our clinic today concerning issues that he is having with his left lower extremity. He tells me that a couple weeks ago he began developing blisters on the left lower extremity along with increased swelling. He typically wears his compression stockings on a regular basis is previously been evaluated both here as well is with vascular surgery they would recommend lymphedema pumps but unfortunately that somehow fell through and he never heard anything back from that. Nonetheless I think lymphedema pumps would be beneficial for this patient. He does have a history of hypertension and diabetes. Obviously the chronic venous stasis and lymphedema as well. At this point the blisters have been given in more trouble he states sometimes when the blisters openings able to clean it down with alcohol and it will dry out and do well. Unfortunately that has not been the case this time. He is having some discomfort although this mean these with cleaning the areas he doesn't have discomfort just on a regular basis. He  has not been able to wear his compression stockings since the blisters arose due to the fact that of course it will drain into the socks causing additional issues and he didn't have any way to wrap this otherwise. He has increased to taking his Lasix every day instead of every other day. He sees his primary care provider later this month as well. No fevers, chills, nausea, or vomiting noted at this time. 11/19/18-Patient returns at 1 week, per intake RN the amount of seepage into the compression wraps was definitely improved, overall all the wounds are measuring smaller but continuing silver alginate to the wounds as primary dressing 11/26/18 on evaluation today patient appears to be doing quite well in regard to his left lower [place] ulcers. In fact of the areas that were noted initially he only has two regions still open. There is no evidence of active infection at this time. He still is not heard anything from the company regarding lymphedema pumps as of yet. Again as previously seen vascular they have not recommended any surgical intervention. 12/03/2018 on evaluation today patient actually appears to be doing quite well with regard to his lower extremity ulcers. In fact most of the areas appear to be healed the one spot which does not seem to be completely healed I am unsure of whether or not this is really draining that much but nonetheless there does not appear to be any signs of infection or significant drainage at this point. There is no sign of fever, chills, nausea, vomiting, or diarrhea. Overall I am pleased with how things have progressed I think is very close to being able to transition  to his home compression stockings. [name], [name] ([id number]) 12/10/2018 upon evaluation today patient appears to be doing quite well with regard to his left lower extremity. He has been tolerating the dressing changes without complication. Fortunately there is no signs of active infection at this time. He  appears after thorough evaluation of his leg to only have 1 small area that remains open at this point everything else appears to be almost completely closed. He still have significant swelling of the left lower extremity. We had discussed discussing this with his primary care provider he is not able to see her in person they were at the [facility] and right now the [place] is not seeing patients on site. According to the patient anyway. Subsequently he did speak with her apparently and his primary care provider feels that he may likely have a DVT. With that being said she has not seen his leg she is just going off of his history. Nonetheless that is a concern that the patient now has as well and while I do not feel the DVT is likely we can definitely ensure that that is not the case I will go ahead and see about putting that order in today. Nonetheless otherwise I am in a recommend that we continue with the current wound care measures including the compression therapy most likely. We just need to ensure that his leg is indeed free of any DVTs. 12/17/2018 on evaluation today patient actually appears to be completely healed today. He does have 2 very small areas of blistering although this is not anything too significant at this point which is good news. With that being said I am in agreement with the fact that I think he is completely healed at this point. He does want to get back into his compression stocking. The good news is we have gotten approval from insurance for his lymphedema pumps we received a letter since last saw him last week. The other good news is his study did come back and showed no evidence of a DVT. 12/20/2018 on evaluation today patient presents for follow-up concerning his ongoing issues with his left lower extremity. He was actually discharged last Friday and did fairly well until he states blisters opened this morning. He tells me he has been wearing his compression stocking although  he has a hard time getting this on. There does not appear to be any signs of active infection at this time. No fevers, chills, nausea, vomiting, or diarrhea. 12/27/2018 on evaluation today patient appears to be doing very well with regard to his swelling of the left lower extremity the 4 layer compression wrap seems to have been beneficial for him. Fortunately there is no signs of active infection at this time. Patient has been tolerating the compression wrap without complication and his foot swelling in particular appears to be greatly improved. He does still have a wound on the lateral portion of his left leg I believe this is more of a blister that has now reopened. 01/03/2019 on evaluation today patient actually appears to be doing excellent in regard to his left lower extremity. He did receive his compression pumps and is actually use this 7 times since he was last here in the office. On top of the compression wrap he is now roughly 3 cm better at the calf and 2 cm better at the ankle he also states that his foot seem to go an issue better without even having to use a shoe horn. Obviously I  think this is all evidence that he is doing excellent in this regard. The other good news is he does not appear to have anything open today as far as wounds are concerned. 01/15/2019 on evaluation today patient appears to be doing more poorly yet again with regard to his left lower extremity. He has developed new wounds again after being discharged just recently. Unfortunately this continues to be the case that he will heal and then have subsequent new wounds. The last time I was hopeful that he may not end up coming back too quickly especially since he states he has been using his lymphedema pumps along with wearing his compression. Nonetheless he had a blister on the back of his leg that popped up on the left and this has opened up into an ulceration it is quite painful. 01/22/19 on evaluation today patient  actually appears to be doing well with regard to his wound on the left lower extremity. He's been tolerating the dressing changes without complication including the compression wrap in the wound appears to be significantly smaller today which is great news. Overall very pleased in this regard. 01/29/2019 on evaluation today patient appears to be doing well with regard to his left posterior lower extremity ulcer. He has been tolerating the dressing changes without complication. This is not completely healed but is getting much closer. We did order a Farrow wrap 4000 for him he has received this and has it with him today although I am not sure we are quite ready to start him on that as of yet. We are very close. 02/05/2019 on evaluation today patient actually appears to be doing quite well with regard to his left posterior lower extremity ulcer. He still has a very tiny opening remaining but the fortunate thing is he seems to be healing quite nicely. He also did get his Farrow wrap which I am hoping will help with his edema control as well at home. Fortunately there is no evidence of active infection. 02/12/2019 patient and fortunately appears to be doing poorly in regard to his wounds of the left lower extremity. He was very close to healing therefore we attempted to use his Velcro compression wraps continuing with lymphedema pumps at home. Unfortunately that does not seem to have done very well for him. He tells me that he wore them all the time but again I am not sure why if that is the case that he is having such significant edema. He is still on his fluid pills as well. With that being said there is no obvious sign of infection although I do wonder about the possibility of infection at this time as well. 02/19/2019 unfortunately upon evaluation today patient appears to be doing more poorly with regard to his left lower extremity. He is not showing signs of significant improvement and I think the  biggest issue here is that he does have an infection that appears to likely be Pseudomonas. That is based on the blue-green drainage that were noted at this time. Unfortunately the antibiotic that has been on is not going to take care of this at all. I think they will get a need to switch him to either Levaquin or Cipro and this was discussed with the patient. 02/26/2019 on evaluation today patient's lower extremity on the left appears to be doing significantly better as compared to last evaluation. Fortunately there is no signs of active infection at this time. He has been tolerating the compression wrap without complication in  fact he made it the whole week at this point. He is showing signs of excellent improvement I am very happy in this regard. With that being said he is having some issues with infection we did review the results of his culture which I noted today. He did have a positive finding for Enterobacter as well as Alcaligenes faecalis. Fortunately the Levaquin that I placed him on will work for both which is great news. There is no signs of systemic infection at this point. 10/30; left posterior leg wound in the setting of very significant edema and what looks like chronic venous inflammation. He has compression pumps but does not use them. We have been using 3 layer compression. Silver alginate to the wound as the primary dressing 03/18/2019 on evaluation today patient appears to be doing a little better compared to last time I saw him. He really has not been using his compression pumps he tells me that he is having too much discomfort. He has been keeping his wraps on however. He is only been taking his fluid pills every other day because he states they are not really helping and he has an appointment with his primary care provider at the [facility] tomorrow. Subsequently the wound itself on the left lower extremity does seem to be greatly improved compared to previous. 03/25/2019 on evaluation  today patient appears to be doing better with regard to his wounds on the bilateral lower extremities. The left is doing excellent the right is also doing better although both still do show some signs of open wounds noted at this point unfortunately. Fortunately there is no signs of active infection at this time. The patient also is not really having any significant pain which is good news. Unfortunately there was some confusion with the referral on vascular disease and as far as getting the patient scheduled there can be contacting him later today to do this [name], [name] ([id number]) fortunately we got this straightened out. 04/01/2019 on evaluation today patient appears to be doing no fevers, chills, nausea, vomiting, or diarrhea. Excellent at this time with regard to his lower extremities. There does not appear to be any open wound at this point which is good news. Fortunately is also no signs of active infection at this time. Overall feel like the patient has done excellent with the compression the problem is every time we got him to this point and then subsequently go to using his own compression things just go right back to where they were. I am not sure how to address this we can try to get an appointment with vascular for 2 weeks now they have yet to call him. Obviously this has become frustrating for the patient as well. I think the issue has just been an honest error as far as scheduling is concerned but nonetheless still worn out the point where I am unsure of which direction we should take. 04/08/2019 on evaluation today patient actually appears to be doing well with regard to his lower extremities. There are no open wounds at this time and things seem to be managing quite nicely as far as the overall edema control is concerned. With that being said he does have his compression socks today for [place] to go ahead and reinitiate therapy in that manner at this point. He is going to be going for  shoes to be measured on Wednesday and then coincidentally he will also be seeing vascular on Thursday. Overall I think this is good news and  again I am hopeful that they will be able to do something for him to help prevent ongoing issues with edema control as well. No fevers, chills, nausea, vomiting, or diarrhea. 04/11/2019 on evaluation today patient actually appears to be doing poorly after just being discharged on Monday of this week. He had been experiencing issues with again blisters especially on the left lower extremity. With that being said he was completely healed and appeared to be doing great this past Monday. He then subsequently has new blisters that formed before his appointment with vascular this morning. He was also measured for shoes in the interim. With that being said we may have figured out what exactly is going on and why he continues to have issues like what we are seeing at this point. He takes his compression stockings off at nighttime and then he ends up having to sleep in his chair for 5-6 hours a night. He sleeps with his feet down he cannot really get him up in the recliner and therefore he is sleeping and the worst possible his position with his feet on the floor for that majority of the time. Again as I explained to him that is about one third at minimum at least one fourth of his day that he spending with his feet dangling down on the ground and the worst possible position they could be. I think this may be what is causing the issue. Subsequently I am leaning toward thinking that he may need a hospital bed in order to elevate his legs. We likely can have to coordinate this with his primary care provider at the [facility]. Readmission: 01/26/2021 this is a patient who presents for repeat evaluation here in the clinic although it is actually been couple of years since have seen him in fact it was December 2020 when I last saw him. Subsequently he never really healed but  did end up being lost to follow-up. He tells me has been having issues ongoing with his lower extremities has bilateral lower extremity lymphedema no real significant or definitive open wounds but in general his lymphedema is way out of control. We were never able to refer him to lymphedema clinic simply due to the fact to be honest we were never able to get him completely healed. I do not see anyone with open wounds. The patient does have evidence of type 2 diabetes mellitus, lymphedema, chronic venous insufficiency, and hypertension. That really has not changed since his last evaluation. 02/09/2021 upon evaluation today patient appears to be doing a little better in regard to his legs although he still having a tremendous amount of drainage especially on the left leg. Fortunately there does not appear to be any evidence of active infection. Of note when we looked into this further it appears that the patient did not have any absorptive dressing on it was just the 4-layer compression wrap. Nonetheless this is probably big part of the issue here. 10/10; he comes in today with 3 large areas on the upper right lower leg likely remanence of denuded blistering under his compression wraps. He has no other wounds on the right. On the left he has the denuded area on the left medial foot and ankle and on the left dorsal foot. Massive lymphedema in both feet dorsally. Using Zetuvit under compression We have increased home health visitation to twice a week to change the dressings and will change it once 02/22/2021 upon evaluation today patient appears to be doing well currently  with regard to his wounds. He has been tolerating the dressing changes without complication. Fortunately there does not appear to be any evidence of active infection which is great news. No fevers, chills, nausea, vomiting, or diarrhea. The biggest issue I see currently is that home health is not putting any medicine on the actual wounds  before wrapping. 03/01/2021 upon evaluation today the patient's right leg actually appears to be doing quite well which is great news there does not appear to be any evidence of active infection at this time. No fevers, chills, nausea, vomiting, or diarrhea. With that being said the patient is having issues on the left foot where he is having significant drainage is also an ammonia smell he does not have any animals at home and this makes me concerned about a bacteria producing urea as a byproduct. Again the possible common organisms will be E. coli, Proteus, and Enterococcus. All 3 of which can be successfully treated with Levaquin. For that reason I think that this may be a good option for [place] to consider placing him on and I did obtain a culture as well for confirmation sake. 03/08/2021 upon evaluation today patient appears to be doing unfortunately still somewhat poorly in regard to his leg ulcerations. He actually has an area on the right leg where he blistered due to the fact that his wrap slid down and caused an area of pinching on his skin and this has led to a significant issue here. 03/15/2021 upon evaluation today patient unfortunately has not been wrapped appropriately with absorptive dressings nor with the appropriate technique for the third layer of the 4-layer compression wrap. These are issues that we continue to try to address with the home health nurse. Also the absorptive dressing that she had was cut in half and therefore that causes things to leak out it does not actually trap the fluid in regard to the top of the foot overall I think that all these combined are really not seeing things improved significantly here. Fortunately there does not appear to be any signs of significant infection at this time which is good news. He still is having a tremendous amount of drainage. 03/22/2021 upon evaluation today patient appears to be draining tremendously. He still continues to tell me  that he is using his pumps 2 times a day and that coupled with that tells me that he is elevating his legs as well. With that being said all things considered I am really just not seeing the improvement we would expect to see with the 4-layer compression wrap and all the above noted. He in fact had an extremely large Zetuvit dressing on both legs and that they were extremely filled to the max with fluid. This is after just being changed just before the weekend and this is Monday. Nonetheless I am concerned about the fact that there is something going on fairly significant that we cannot get any of this under control and that he is draining this significantly. He supposed be having an echocardiogram it sounds like scheduling has been an issue for him as far as getting in sooner. Its something to do with needing his cousin to drive him because of where it sat and he cannot drive himself to this appointment either way I really think he needs to try to see what he can do about making this happen a little sooner. He tells me he will call today. [name], [name] ([id number]) 03/29/2021 on evaluation today patient appears to be  doing about the same in regard to his legs. He did get his cardiology appointment moved up to 6 December which is at least good that is better than what it was before mid December. Overall very pleased in that regard. 04/05/2021 upon evaluation today patient unfortunately is still doing fairly poorly. There does not appear to be any signs of active infection at this time. No fevers, chills, nausea, vomiting, or diarrhea. Unfortunately I think until his edema is under control and overall fluid overload there is really not to be much chance that I can do much to get him better. This is quite unfortunate and frustrating both for myself and the patient to be perfectly honest. Nonetheless I think that he really needs to have a conversation both with his primary care provider as well as  cardiologist he sees the PCP on Monday and cardiology on Wednesday of next week. Electronic Signature(s) Signed: 04/05/2021 4:53:23 PM By: Worthy Keeler PA-C Entered By: Worthy Keeler on 04/05/2021 16:53:23 [name] ([id number]) -------------------------------------------------------------------------------- Physical Exam Details Patient Name: [name] Date of Service: 04/05/2021 10:15 AM Medical Record Number: [id number] Patient Account Number: [id number] Date of Birth/Sex: 04-03-1951 (70 y.o. M) Treating RN: [name] Primary Care Provider: Felipa Eth Other Clinician: Referring Provider: Felipa Eth Treating Provider/Extender: Skipper Cliche in Treatment: 9 Constitutional Obese and well-hydrated in no acute distress. Respiratory normal breathing without difficulty. Psychiatric this patient is able to make decisions and demonstrates good insight into disease process. Alert and Oriented x 3. pleasant and cooperative. Notes Upon inspection patient showed still continued and extreme weeping and drainage of the bilateral lower extremities left greater than right. Unfortunately this is just continuing to be an ongoing issue and I am really not certain what I can do to improve this short of him getting the fluid under control. This is can I think I think much more medical management than what I can offer from a wound care center standpoint. Electronic Signature(s) Signed: 04/05/2021 4:53:55 PM By: Worthy Keeler PA-C Entered By: Worthy Keeler on 04/05/2021 16:53:55 [name] ([id number]) -------------------------------------------------------------------------------- Physician Orders Details Patient Name: [name] Date of Service: 04/05/2021 10:15 AM Medical Record Number: [id number] Patient Account Number: [id number] Date of Birth/Sex: 09-24-1950 (70 y.o. M) Treating RN: [name] Primary Care Provider: Felipa Eth Other  Clinician: Referring Provider: Felipa Eth Treating Provider/Extender: Skipper Cliche in Treatment: 9 Verbal / Phone Orders: No Diagnosis Coding ICD-10 Coding Code Description E11.622 Type 2 diabetes mellitus with other skin ulcer I89.0 Lymphedema, not elsewhere classified I87.2 Venous insufficiency (chronic) (peripheral) L97.822 Non-pressure chronic ulcer of other part of left lower leg with fat layer exposed L97.512 Non-pressure chronic ulcer of other part of right foot with fat layer exposed I10 Essential (primary) hypertension L97.811 Non-pressure chronic ulcer of other part of right lower leg limited to breakdown of skin Follow-up Appointments o Return Appointment in 1 week. [place] for wound care. May utilize formulary equivalent dressing for wound treatment orders unless otherwise specified. Home Health Nurse may visit PRN to address patientos wound care needs. - frequency 3 times per week - patient will be seen once at wound center - home health to see patient 2 times per week on Wed and friday Bathing/ Shower/ Hygiene o May shower with wound dressing protected with water repellent cover or cast protector. Edema Control - Lymphedema / Segmental Compressive Device / Other Bilateral Lower Extremities o Optional: One layer of unna paste to  top of compression wrap (to act as an anchor). - Unna paste on feet and calf to secure wrap in place o 4 Layer Compression System Lymphedema. - bi lat, apply zetuvits/ or formulary for extra absorbant dressing around ankles and foot area, 3 times per week COTTON LAYER SPIRAL, LIGHT TAN SPIRAL. WHITE WITH YELLOW LINE FIGURE 8 , COBAN SPIRAL o Elevate, Exercise Daily and Avoid Standing for Long Periods of Time. o Elevate legs to the level of the heart and pump ankles as often as possible o Elevate leg(s) parallel to the floor when sitting. o Compression Pump: Use compression pump on left lower  extremity for 60 minutes, twice daily. - Start today o Compression Pump: Use compression pump on right lower extremity for 60 minutes, twice daily. - Start today o DO YOUR BEST to sleep in the bed at night. DO NOT sleep in your recliner. Long hours of sitting in a recliner leads to swelling of the legs and/or potential wounds on your backside. o Other: - Contact prescriber regarding use of diuretics to reduce fluid overload. Off-Loading Bilateral Lower Extremities o Open toe surgical shoe Wound Treatment Wound #15 - Lower Leg Wound Laterality: Left, Medial Secondary Dressing: Zetuvit Plus Silicone Non-bordered 5x5 (in/in) 3 x Per Week/30 Days Discharge Instructions: please do not cut zetuvit as they are not made to be cut causes leak and release fluid collected Compression Wrap: Medichoice 4 layer Compression System, 35-40 mmHG (Generic) 3 x Per Week/30 Days Discharge Instructions: Apply multi-layer wrap as directed. Electronic Signature(s) Signed: 04/05/2021 5:14:38 PM By: Irean Hong Struble, [place] ([id number]) Signed: 04/07/2021 2:51:09 PM By: [name] RN Entered By: [name] on 04/05/2021 10:42:46 [name] ([id number]) -------------------------------------------------------------------------------- Problem List Details Patient Name: [name] Date of Service: 04/05/2021 10:15 AM Medical Record Number: [id number] Patient Account Number: [id number] Date of Birth/Sex: [birth year] (70 y.o. M) Treating RN: [name] Primary Care Provider: Felipa Eth Other Clinician: Referring Provider: Felipa Eth Treating Provider/Extender: Skipper Cliche in Treatment: 9 Active Problems ICD-10 Encounter Code Description Active Date MDM Diagnosis E11.622 Type 2 diabetes mellitus with other skin ulcer 01/26/2021 No Yes I89.0 Lymphedema, not elsewhere classified 01/26/2021 No Yes I87.2 Venous insufficiency (chronic) (peripheral) 01/26/2021 No Yes L97.822  Non-pressure chronic ulcer of other part of left lower leg with fat layer 01/26/2021 No Yes exposed L97.512 Non-pressure chronic ulcer of other part of right foot with fat layer 01/26/2021 No Yes exposed [place] (primary) hypertension 01/26/2021 No Yes L97.811 Non-pressure chronic ulcer of other part of right lower leg limited to 02/15/2021 No Yes breakdown of skin Inactive Problems Resolved Problems Electronic Signature(s) Signed: 04/05/2021 10:38:59 AM By: Worthy Keeler PA-C Entered By: Worthy Keeler on 04/05/2021 10:38:59 [name] ([id number]) -------------------------------------------------------------------------------- Progress Note Details Patient Name: [name] Date of Service: 04/05/2021 10:15 AM Medical Record Number: [id number] Patient Account Number: [id number] Date of Birth/Sex: 1951-02-02 (69 y.o. M) Treating RN: [name] Primary Care Provider: Felipa Eth Other Clinician: Referring Provider: Felipa Eth Treating Provider/Extender: Skipper Cliche in Treatment: 9 Subjective Chief Complaint Information obtained from Patient Left LE ulcers History of Present Illness (HPI) 70 year old male who presented to the ER with bilateral lower extremity blisters which had started last week. he has a past medical history of leukemia, diabetes mellitus, hypertension, edema of both lower extremities, his recurrent skin infections, peripheral vascular disease, coronary artery disease, congestive heart failure and peripheral neuropathy. in the ER he was given Rocephin and put on Silvadene cream. he  was put on oral doxycycline and was asked to follow-up with the [facility] His last hemoglobin A1c was 6.6 in December and he checks his blood sugar once a week. He does not have any physicians outside the [place] system. He does not recall any vascular duplex studies done either for arterial or venous disease but was told to wear compression stockings which  he does not use 05/30/2016 -- we have not yet received any of his notes from the [facility] hospital system and his arterial and venous duplex studies are scheduled here in [place] around mid February. We are unable to have his insurance accepted by home health agencies and hence he is getting dressings only once a week. 06/06/16 -- -- I received a call from the patient's PCP at the [facility] and spoke to Dr. Garvin Fila, phone number [phone] and fax number [phone]. She confirmed that no vascular testing was done over the last 5 years and she would be happy to do them if the patient did want them to be done at the [place] and we could fax him a request. Readmission: 70 year old male seen by as in February of this year and was referred to vein and vascular for studies and opinion from the vascular surgeons. The patient returns today with a fresh problem having had blisters on his left lower extremity which have been there for about 5 days and he clearly states that he has been wearing his compression stockings as advised though he could not read the moderate compression and has been wearing light compression. Review of his electronic medical records note that he had lower extremity arterial duplex examination done on 06/23/2016 which showed no hemodynamically significant stenosis in the bilateral lower extremity arterial system. He also had a lower extremity venous reflux examination done on 07/07/2016 and it was noted that he had venous incompetence in the right great saphenous vein and bilateral common femoral veins. Patient was seen by Dr. Tamala Julian on the same day and for some reason his notes do not reflect the venous studies or the arterial studies and he recommended patient do a venous duplex ultrasound to look for reflux and return to see him.he would also consider a lymph pump if required. The patient was told that his workup was normal and hence the patient canceled his follow-up  appointment. 02/03/17 on evaluation today patient left medial lower extremity blister appears to be doing about the same. It is still continuing to drain and there's still the blistered skin covering the wound bed which is making it difficult for the alternate to do its job. Fortunately there is no evidence of cellulitis. No fevers chills noted. Patient states in general he is not having any significant discomfort. Patient's lower extremity arterial duplex exam revealed that patient was hemodynamically stable with no evidence of stenosis in regard to the bilateral lower extremities. The lower extremity venous reflux exam revealed the patient had venous incontinence noted in the right greater saphenous and bilateral common femoral vein. There is no evidence of deep or superficial vein thrombosis in the bilateral lower extremities. Readmission: [birth year] Patient presents for evaluation our clinic today concerning issues that he is having with his left lower extremity. He tells me that a couple weeks ago he began developing blisters on the left lower extremity along with increased swelling. He typically wears his compression stockings on a regular basis is previously been evaluated both here as well is with vascular surgery they would recommend lymphedema pumps but  unfortunately that somehow fell through and he never heard anything back from that. Nonetheless I think lymphedema pumps would be beneficial for this patient. He does have a history of hypertension and diabetes. Obviously the chronic venous stasis and lymphedema as well. At this point the blisters have been given in more trouble he states sometimes when the blisters openings able to clean it down with alcohol and it will dry out and do well. Unfortunately that has not been the case this time. He is having some discomfort although this mean these with cleaning the areas he doesn't have discomfort just on a regular basis. He has not been able to  wear his compression stockings since the blisters arose due to the fact that of course it will drain into the socks causing additional issues and he didn't have any way to wrap this otherwise. He has increased to taking his Lasix every day instead of every other day. He sees his primary care provider later this month as well. No fevers, chills, nausea, or vomiting noted at this time. 11/19/18-Patient returns at 1 week, per intake RN the amount of seepage into the compression wraps was definitely improved, overall all the wounds are measuring smaller but continuing silver alginate to the wounds as primary dressing 11/26/18 on evaluation today patient appears to be doing quite well in regard to his left lower [place] ulcers. In fact of the areas that were noted initially he only has two regions still open. There is no evidence of active infection at this time. He still is not heard anything from the company regarding lymphedema pumps as of yet. Again as previously seen vascular they have not recommended any surgical intervention. [name], [name] ([id number]) 12/03/2018 on evaluation today patient actually appears to be doing quite well with regard to his lower extremity ulcers. In fact most of the areas appear to be healed the one spot which does not seem to be completely healed I am unsure of whether or not this is really draining that much but nonetheless there does not appear to be any signs of infection or significant drainage at this point. There is no sign of fever, chills, nausea, vomiting, or diarrhea. Overall I am pleased with how things have progressed I think is very close to being able to transition to his home compression stockings. 12/10/2018 upon evaluation today patient appears to be doing quite well with regard to his left lower extremity. He has been tolerating the dressing changes without complication. Fortunately there is no signs of active infection at this time. He appears after thorough  evaluation of his leg to only have 1 small area that remains open at this point everything else appears to be almost completely closed. He still have significant swelling of the left lower extremity. We had discussed discussing this with his primary care provider he is not able to see her in person they were at the [facility] and right now the [place] is not seeing patients on site. According to the patient anyway. Subsequently he did speak with her apparently and his primary care provider feels that he may likely have a DVT. With that being said she has not seen his leg she is just going off of his history. Nonetheless that is a concern that the patient now has as well and while I do not feel the DVT is likely we can definitely ensure that that is not the case I will go ahead and see about putting that order in today. Nonetheless  otherwise I am in a recommend that we continue with the current wound care measures including the compression therapy most likely. We just need to ensure that his leg is indeed free of any DVTs. 12/17/2018 on evaluation today patient actually appears to be completely healed today. He does have 2 very small areas of blistering although this is not anything too significant at this point which is good news. With that being said I am in agreement with the fact that I think he is completely healed at this point. He does want to get back into his compression stocking. The good news is we have gotten approval from insurance for his lymphedema pumps we received a letter since last saw him last week. The other good news is his study did come back and showed no evidence of a DVT. 12/20/2018 on evaluation today patient presents for follow-up concerning his ongoing issues with his left lower extremity. He was actually discharged last Friday and did fairly well until he states blisters opened this morning. He tells me he has been wearing his compression stocking although he has a hard time  getting this on. There does not appear to be any signs of active infection at this time. No fevers, chills, nausea, vomiting, or diarrhea. 12/27/2018 on evaluation today patient appears to be doing very well with regard to his swelling of the left lower extremity the 4 layer compression wrap seems to have been beneficial for him. Fortunately there is no signs of active infection at this time. Patient has been tolerating the compression wrap without complication and his foot swelling in particular appears to be greatly improved. He does still have a wound on the lateral portion of his left leg I believe this is more of a blister that has now reopened. 01/03/2019 on evaluation today patient actually appears to be doing excellent in regard to his left lower extremity. He did receive his compression pumps and is actually use this 7 times since he was last here in the office. On top of the compression wrap he is now roughly 3 cm better at the calf and 2 cm better at the ankle he also states that his foot seem to go an issue better without even having to use a shoe horn. Obviously I think this is all evidence that he is doing excellent in this regard. The other good news is he does not appear to have anything open today as far as wounds are concerned. 01/15/2019 on evaluation today patient appears to be doing more poorly yet again with regard to his left lower extremity. He has developed new wounds again after being discharged just recently. Unfortunately this continues to be the case that he will heal and then have subsequent new wounds. The last time I was hopeful that he may not end up coming back too quickly especially since he states he has been using his lymphedema pumps along with wearing his compression. Nonetheless he had a blister on the back of his leg that popped up on the left and this has opened up into an ulceration it is quite painful. 01/22/19 on evaluation today patient actually appears to be  doing well with regard to his wound on the left lower extremity. He's been tolerating the dressing changes without complication including the compression wrap in the wound appears to be significantly smaller today which is great news. Overall very pleased in this regard. 01/29/2019 on evaluation today patient appears to be doing well with regard to his  left posterior lower extremity ulcer. He has been tolerating the dressing changes without complication. This is not completely healed but is getting much closer. We did order a Farrow wrap 4000 for him he has received this and has it with him today although I am not sure we are quite ready to start him on that as of yet. We are very close. 02/05/2019 on evaluation today patient actually appears to be doing quite well with regard to his left posterior lower extremity ulcer. He still has a very tiny opening remaining but the fortunate thing is he seems to be healing quite nicely. He also did get his Farrow wrap which I am hoping will help with his edema control as well at home. Fortunately there is no evidence of active infection. 02/12/2019 patient and fortunately appears to be doing poorly in regard to his wounds of the left lower extremity. He was very close to healing therefore we attempted to use his Velcro compression wraps continuing with lymphedema pumps at home. Unfortunately that does not seem to have done very well for him. He tells me that he wore them all the time but again I am not sure why if that is the case that he is having such significant edema. He is still on his fluid pills as well. With that being said there is no obvious sign of infection although I do wonder about the possibility of infection at this time as well. 02/19/2019 unfortunately upon evaluation today patient appears to be doing more poorly with regard to his left lower extremity. He is not showing signs of significant improvement and I think the biggest issue here is that  he does have an infection that appears to likely be Pseudomonas. That is based on the blue-green drainage that were noted at this time. Unfortunately the antibiotic that has been on is not going to take care of this at all. I think they will get a need to switch him to either Levaquin or Cipro and this was discussed with the patient. 02/26/2019 on evaluation today patient's lower extremity on the left appears to be doing significantly better as compared to last evaluation. Fortunately there is no signs of active infection at this time. He has been tolerating the compression wrap without complication in fact he made it the whole week at this point. He is showing signs of excellent improvement I am very happy in this regard. With that being said he is having some issues with infection we did review the results of his culture which I noted today. He did have a positive finding for Enterobacter as well as Alcaligenes faecalis. Fortunately the Levaquin that I placed him on will work for both which is great news. There is no signs of systemic infection at this point. 10/30; left posterior leg wound in the setting of very significant edema and what looks like chronic venous inflammation. He has compression pumps but does not use them. We have been using 3 layer compression. Silver alginate to the wound as the primary dressing 03/18/2019 on evaluation today patient appears to be doing a little better compared to last time I saw him. He really has not been using his compression pumps he tells me that he is having too much discomfort. He has been keeping his wraps on however. He is only been taking his fluid pills every other day because he states they are not really helping and he has an appointment with his primary care provider at the [facility] tomorrow.  Subsequently the wound itself on the left lower extremity does seem to be greatly improved compared to previous. [name], [name] ([id number]) 03/25/2019 on evaluation  today patient appears to be doing better with regard to his wounds on the bilateral lower extremities. The left is doing excellent the right is also doing better although both still do show some signs of open wounds noted at this point unfortunately. Fortunately there is no signs of active infection at this time. The patient also is not really having any significant pain which is good news. Unfortunately there was some confusion with the referral on vascular disease and as far as getting the patient scheduled there can be contacting him later today to do this fortunately we got this straightened out. 04/01/2019 on evaluation today patient appears to be doing no fevers, chills, nausea, vomiting, or diarrhea. Excellent at this time with regard to his lower extremities. There does not appear to be any open wound at this point which is good news. Fortunately is also no signs of active infection at this time. Overall feel like the patient has done excellent with the compression the problem is every time we got him to this point and then subsequently go to using his own compression things just go right back to where they were. I am not sure how to address this we can try to get an appointment with vascular for 2 weeks now they have yet to call him. Obviously this has become frustrating for the patient as well. I think the issue has just been an honest error as far as scheduling is concerned but nonetheless still worn out the point where I am unsure of which direction we should take. 04/08/2019 on evaluation today patient actually appears to be doing well with regard to his lower extremities. There are no open wounds at this time and things seem to be managing quite nicely as far as the overall edema control is concerned. With that being said he does have his compression socks today for [place] to go ahead and reinitiate therapy in that manner at this point. He is going to be going for shoes to be measured on  Wednesday and then coincidentally he will also be seeing vascular on Thursday. Overall I think this is good news and again I am hopeful that they will be able to do something for him to help prevent ongoing issues with edema control as well. No fevers, chills, nausea, vomiting, or diarrhea. 04/11/2019 on evaluation today patient actually appears to be doing poorly after just being discharged on Monday of this week. He had been experiencing issues with again blisters especially on the left lower extremity. With that being said he was completely healed and appeared to be doing great this past Monday. He then subsequently has new blisters that formed before his appointment with vascular this morning. He was also measured for shoes in the interim. With that being said we may have figured out what exactly is going on and why he continues to have issues like what we are seeing at this point. He takes his compression stockings off at nighttime and then he ends up having to sleep in his chair for 5-6 hours a night. He sleeps with his feet down he cannot really get him up in the recliner and therefore he is sleeping and the worst possible his position with his feet on the floor for that majority of the time. Again as I explained to him that is about one third at  minimum at least one fourth of his day that he spending with his feet dangling down on the ground and the worst possible position they could be. I think this may be what is causing the issue. Subsequently I am leaning toward thinking that he may need a hospital bed in order to elevate his legs. We likely can have to coordinate this with his primary care provider at the [facility]. Readmission: 01/26/2021 this is a patient who presents for repeat evaluation here in the clinic although it is actually been couple of years since have seen him in fact it was December 2020 when I last saw him. Subsequently he never really healed but did end up being lost to  follow-up. He tells me has been having issues ongoing with his lower extremities has bilateral lower extremity lymphedema no real significant or definitive open wounds but in general his lymphedema is way out of control. We were never able to refer him to lymphedema clinic simply due to the fact to be honest we were never able to get him completely healed. I do not see anyone with open wounds. The patient does have evidence of type 2 diabetes mellitus, lymphedema, chronic venous insufficiency, and hypertension. That really has not changed since his last evaluation. 02/09/2021 upon evaluation today patient appears to be doing a little better in regard to his legs although he still having a tremendous amount of drainage especially on the left leg. Fortunately there does not appear to be any evidence of active infection. Of note when we looked into this further it appears that the patient did not have any absorptive dressing on it was just the 4-layer compression wrap. Nonetheless this is probably big part of the issue here. 10/10; he comes in today with 3 large areas on the upper right lower leg likely remanence of denuded blistering under his compression wraps. He has no other wounds on the right. On the left he has the denuded area on the left medial foot and ankle and on the left dorsal foot. Massive lymphedema in both feet dorsally. Using Zetuvit under compression We have increased home health visitation to twice a week to change the dressings and will change it once 02/22/2021 upon evaluation today patient appears to be doing well currently with regard to his wounds. He has been tolerating the dressing changes without complication. Fortunately there does not appear to be any evidence of active infection which is great news. No fevers, chills, nausea, vomiting, or diarrhea. The biggest issue I see currently is that home health is not putting any medicine on the actual wounds  before wrapping. 03/01/2021 upon evaluation today the patient's right leg actually appears to be doing quite well which is great news there does not appear to be any evidence of active infection at this time. No fevers, chills, nausea, vomiting, or diarrhea. With that being said the patient is having issues on the left foot where he is having significant drainage is also an ammonia smell he does not have any animals at home and this makes me concerned about a bacteria producing urea as a byproduct. Again the possible common organisms will be E. coli, Proteus, and Enterococcus. All 3 of which can be successfully treated with Levaquin. For that reason I think that this may be a good option for [place] to consider placing him on and I did obtain a culture as well for confirmation sake. 03/08/2021 upon evaluation today patient appears to be doing unfortunately still somewhat poorly  in regard to his leg ulcerations. He actually has an area on the right leg where he blistered due to the fact that his wrap slid down and caused an area of pinching on his skin and this has led to a significant issue here. 03/15/2021 upon evaluation today patient unfortunately has not been wrapped appropriately with absorptive dressings nor with the appropriate technique for the third layer of the 4-layer compression wrap. These are issues that we continue to try to address with the home health nurse. Also the absorptive dressing that she had was cut in half and therefore that causes things to leak out it does not actually trap the fluid in regard to the top of the foot overall I think that all these combined are really not seeing things improved significantly here. Fortunately there does not appear to be any signs of significant infection at this time which is good news. He still is having a tremendous amount of drainage. 03/22/2021 upon evaluation today patient appears to be draining tremendously. He still continues to tell me  that he is using his pumps 2 times a day and that coupled with that tells me that he is elevating his legs as well. With that being said all things considered I am really just not seeing the improvement we would expect to see with the 4-layer compression wrap and all the above noted. He in fact had an extremely large Zetuvit dressing on both legs and that they were extremely filled to the max with fluid. This is after just being changed just before the weekend and this [name], [name] ([id number]) is Monday. Nonetheless I am concerned about the fact that there is something going on fairly significant that we cannot get any of this under control and that he is draining this significantly. He supposed be having an echocardiogram it sounds like scheduling has been an issue for him as far as getting in sooner. Its something to do with needing his cousin to drive him because of where it sat and he cannot drive himself to this appointment either way I really think he needs to try to see what he can do about making this happen a little sooner. He tells me he will call today. 03/29/2021 on evaluation today patient appears to be doing about the same in regard to his legs. He did get his cardiology appointment moved up to 6 December which is at least good that is better than what it was before mid December. Overall very pleased in that regard. 04/05/2021 upon evaluation today patient unfortunately is still doing fairly poorly. There does not appear to be any signs of active infection at this time. No fevers, chills, nausea, vomiting, or diarrhea. Unfortunately I think until his edema is under control and overall fluid overload there is really not to be much chance that I can do much to get him better. This is quite unfortunate and frustrating both for myself and the patient to be perfectly honest. Nonetheless I think that he really needs to have a conversation both with his primary care provider as well as  cardiologist he sees the PCP on Monday and cardiology on Wednesday of next week. Objective Constitutional Obese and well-hydrated in no acute distress. Vitals Time Taken: 10:09 AM, Height: 73 in, Weight: 312 lbs, BMI: 41.2, Temperature: 98.2 F, Pulse: 86 bpm, Respiratory Rate: 18 breaths/min, Blood Pressure: 121/70 mmHg. Respiratory normal breathing without difficulty. Psychiatric this patient is able to make decisions and demonstrates good insight into disease  process. Alert and Oriented x 3. pleasant and cooperative. General Notes: Upon inspection patient showed still continued and extreme weeping and drainage of the bilateral lower extremities left greater than right. Unfortunately this is just continuing to be an ongoing issue and I am really not certain what I can do to improve this short of him getting the fluid under control. This is can I think I think much more medical management than what I can offer from a wound care center standpoint. Integumentary (Hair, Skin) Wound #15 status is Open. Original cause of wound was Blister. The date acquired was: 03/08/2021. The wound has been in treatment 4 weeks. The wound is located on the Left,Medial Lower Leg. The wound measures 16cm length x 27cm width x 0.2cm depth; 339.292cm^2 area and 67.858cm^3 volume. There is Fat Layer (Subcutaneous Tissue) exposed. There is no tunneling or undermining noted. There is a large amount of serous drainage noted. There is medium (34-66%) pink granulation within the wound bed. There is a medium (34-66%) amount of necrotic tissue within the wound bed including Adherent Slough. Wound #16 status is Healed - Epithelialized. Original cause of wound was Blister. The date acquired was: 03/26/2021. The wound has been in treatment 1 weeks. The wound is located on the Right,Posterior Lower Leg. The wound measures 0cm length x 0cm width x 0cm depth; 0cm^2 area and 0cm^3 volume. There is no tunneling or undermining  noted. There is a none present amount of drainage noted. There is no granulation within the wound bed. There is no necrotic tissue within the wound bed. Assessment Active Problems ICD-10 Type 2 diabetes mellitus with other skin ulcer Lymphedema, not elsewhere classified Venous insufficiency (chronic) (peripheral) Non-pressure chronic ulcer of other part of left lower leg with fat layer exposed Non-pressure chronic ulcer of other part of right foot with fat layer exposed Essential (primary) hypertension Non-pressure chronic ulcer of other part of right lower leg limited to breakdown of skin [place], [name] ([id number]) Procedures Wound #15 Pre-procedure diagnosis of Wound #15 is a Lymphedema located on the Left,Medial Lower Leg . There was a Four Layer Compression Therapy Procedure by [name], RN. Post procedure Diagnosis Wound #15: Same as Pre-Procedure There was a Four Layer Compression Therapy Procedure by [name], RN. Post procedure Diagnosis Wound #: Same as Pre-Procedure Plan Follow-up Appointments: Return Appointment in 1 week. Home Health: [facility] for wound care. May utilize formulary equivalent dressing for wound treatment orders unless otherwise specified. Home Health Nurse may visit PRN to address patient s wound care needs. - frequency 3 times per week - patient will be seen once at wound center - home health to see patient 2 times per week on Wed and friday Bathing/ Shower/ Hygiene: May shower with wound dressing protected with water repellent cover or cast protector. Edema Control - Lymphedema / Segmental Compressive Device / Other: Optional: One layer of unna paste to top of compression wrap (to act as an anchor). - Unna paste on feet and calf to secure wrap in place 4 Layer Compression System Lymphedema. - bi lat, apply zetuvits/ or formulary for extra absorbant dressing around ankles and foot area, 3 times per week COTTON LAYER SPIRAL, LIGHT TAN  SPIRAL. WHITE WITH YELLOW LINE FIGURE 8 , COBAN SPIRAL Elevate, Exercise Daily and Avoid Standing for Long Periods of Time. Elevate legs to the level of the heart and pump ankles as often as possible Elevate leg(s) parallel to the floor when sitting. Compression Pump: Use compression pump  on left lower extremity for 60 minutes, twice daily. - Start today Compression Pump: Use compression pump on right lower extremity for 60 minutes, twice daily. - Start today DO YOUR BEST to sleep in the bed at night. DO NOT sleep in your recliner. Long hours of sitting in a recliner leads to swelling of the legs and/or potential wounds on your backside. Other: - Contact prescriber regarding use of diuretics to reduce fluid overload. Off-Loading: Open toe surgical shoe WOUND #15: - Lower Leg Wound Laterality: Left, Medial Secondary Dressing: Zetuvit Plus Silicone Non-bordered 5x5 (in/in) 3 x Per Week/30 Days Discharge Instructions: please do not cut zetuvit as they are not made to be cut causes leak and release fluid collected Compression Wrap: Medichoice 4 layer Compression System, 35-40 mmHG (Generic) 3 x Per Week/30 Days Discharge Instructions: Apply multi-layer wrap as directed. 1. Explained to patient today if he is not able to get this under control by way of outpatient therapy with cardiology or otherwise that the only other option I do not understand him going to the ER for potential hospital admission to try to get things under control. Obviously somehow we have to make this happen but again I really do not know that I have many more options from a wound care outpatient perspective and to be honest he really does not have wounds as much as he just has aggressive drainage. 2. There is no signs of infection currently which is great news though that is definitely a risk if this continues to be open and weeping like it is currently. 3. For the time being we will continue with the compression therapy as  much as we can here. We will see patient back for reevaluation in 1 week here in the clinic. If anything worsens or changes patient will contact our office for additional recommendations. Electronic Signature(s) Signed: 04/05/2021 4:56:20 PM By: Worthy Keeler PA-C Entered By: Worthy Keeler on 04/05/2021 16:56:20 [name] ([id number]) -------------------------------------------------------------------------------- SuperBill Details Patient Name: [name] Date of Service: 04/05/2021 Medical Record Number: [id number] Patient Account Number: [id number] Date of Birth/Sex: 1950/06/01 (70 y.o. M) Treating RN: [name] Primary Care Provider: Felipa Eth Other Clinician: Referring Provider: Felipa Eth Treating Provider/Extender: Skipper Cliche in Treatment: 9 Diagnosis Coding ICD-10 Codes Code Description E11.622 Type 2 diabetes mellitus with other skin ulcer I89.0 Lymphedema, not elsewhere classified I87.2 Venous insufficiency (chronic) (peripheral) L97.822 Non-pressure chronic ulcer of other part of left lower leg with fat layer exposed L97.512 Non-pressure chronic ulcer of other part of right foot with fat layer exposed I10 Essential (primary) hypertension L97.811 Non-pressure chronic ulcer of other part of right lower leg limited to breakdown of skin Facility Procedures CPT4: Description Modifier Quantity Code [id number] BILATERAL: Application of multi-layer venous compression system; leg (below knee), including 1 ankle and foot. Physician Procedures CPT4 Code: 5170017 Description: 49449 - WC PHYS LEVEL 4 - EST PT Modifier: Quantity: 1 CPT4 Code: Description: ICD-10 Diagnosis Description E11.622 Type 2 diabetes mellitus with other skin ulcer I89.0 Lymphedema, not elsewhere classified I87.2 Venous insufficiency (chronic) (peripheral) L97.822 Non-pressure chronic ulcer of other part of left lower  leg with fat layer Modifier:  exposed Quantity: Electronic Signature(s) Signed: 04/05/2021 4:59:24 PM By: Worthy Keeler PA-C Entered By: Worthy Keeler on 04/05/2021 16:59:24

## 2021-04-07 NOTE — Progress Notes (Signed)
BERK, PILOT ([id number]) Visit Report for 04/05/2021 Arrival Information Details Patient Name: [name] Date of Service: 04/05/2021 10:15 AM Medical Record Number: [id number] Patient Account Number: [id number] Date of Birth/Sex: May 15, 1950 (70 y.o. M) Treating RN: [name] Primary Care Sheridan Hew: Felipa Eth Other Clinician: Referring Annmargaret Decaprio: Felipa Eth Treating Amelda Hapke/Extender: Skipper Cliche in Treatment: 9 Visit Information History Since Last Visit All ordered tests and consults were completed: No Patient Arrived: [name] Added or deleted any medications: No Arrival Time: 10:05 Any new allergies or adverse reactions: No Accompanied By: self Had a fall or experienced change in No Transfer Assistance: None activities of daily living that may affect Patient Identification Verified: Yes risk of falls: Secondary Verification Process Completed: Yes Signs or symptoms of abuse/neglect since last visito No Patient Requires Transmission-Based Precautions: No Hospitalized since last visit: No Patient Has Alerts: No Implantable device outside of the clinic excluding No cellular tissue based products placed in the center since last visit: Has Dressing in Place as Prescribed: Yes Has Compression in Place as Prescribed: Yes Pain Present Now: No Electronic Signature(s) Signed: 04/07/2021 2:51:09 PM By: [name] Coria RN Entered By: [name] on 04/05/2021 10:09:45 [name] ([id number]) -------------------------------------------------------------------------------- Clinic Level of Care Assessment Details Patient Name: [name] Date of Service: 04/05/2021 10:15 AM Medical Record Number: [id number] Patient Account Number: [id number] Date of Birth/Sex: 1950-06-12 (70 y.o. M) Treating RN: [name] Primary Care Searcy Miyoshi: Felipa Eth Other Clinician: Referring Jeancarlo Leffler: Felipa Eth Treating Jaydence Arnesen/Extender: Skipper Cliche in Treatment:  9 Clinic Level of Care Assessment Items TOOL 1 Quantity Score []  - Use when EandM and Procedure is performed on INITIAL visit 0 ASSESSMENTS - Nursing Assessment / Reassessment []  - General Physical Exam (combine w/ comprehensive assessment (listed just below) when performed on new 0 pt. evals) []  - 0 Comprehensive Assessment (HX, ROS, Risk Assessments, Wounds Hx, etc.) ASSESSMENTS - Wound and Skin Assessment / Reassessment []  - Dermatologic / Skin Assessment (not related to wound area) 0 ASSESSMENTS - Ostomy and/or Continence Assessment and Care []  - Incontinence Assessment and Management 0 []  - 0 Ostomy Care Assessment and Management (repouching, etc.) PROCESS - Coordination of Care []  - Simple Patient / Family Education for ongoing care 0 []  - 0 Complex (extensive) Patient / Family Education for ongoing care []  - 0 Staff obtains Programmer, systems, Records, Test Results / Process Orders []  - 0 Staff telephones HHA, Nursing Homes / Clarify orders / etc []  - 0 Routine Transfer to another Facility (non-emergent condition) []  - 0 Routine Hospital Admission (non-emergent condition) []  - 0 New Admissions / Biomedical engineer / Ordering NPWT, Apligraf, etc. []  - 0 Emergency Hospital Admission (emergent condition) PROCESS - Special Needs []  - Pediatric / Minor Patient Management 0 []  - 0 Isolation Patient Management []  - 0 Hearing / Language / Visual special needs []  - 0 Assessment of Community assistance (transportation, D/C planning, etc.) []  - 0 Additional assistance / Altered mentation []  - 0 Support Surface(s) Assessment (bed, cushion, seat, etc.) INTERVENTIONS - Miscellaneous []  - External ear exam 0 []  - 0 Patient Transfer (multiple staff / Civil Service fast streamer / Similar devices) []  - 0 Simple Staple / Suture removal (25 or less) []  - 0 Complex Staple / Suture removal (26 or more) []  - 0 Hypo/Hyperglycemic Management (do not check if billed separately) []  - 0 Ankle /  Brachial Index (ABI) - do not check if billed separately Has the patient been seen at the hospital within the last three years: Yes Total  Score: 0 Level Of Care: ____ [name] ([id number]) Electronic Signature(s) Signed: 04/07/2021 2:51:09 PM By: [name] Coria RN Entered By: [name] on 04/05/2021 10:42:54 [name] ([id number]) -------------------------------------------------------------------------------- Compression Therapy Details Patient Name: [name] Date of Service: 04/05/2021 10:15 AM Medical Record Number: [id number] Patient Account Number: [id number] Date of Birth/Sex: 1950-08-31 (70 y.o. M) Treating RN: [name] Primary Care Ryelee Albee: Felipa Eth Other Clinician: Referring Maitland Lesiak: Felipa Eth Treating Kelin Borum/Extender: Skipper Cliche in Treatment: 9 Compression Therapy Performed for Wound Assessment: Wound #15 Left,Medial Lower Leg Performed By: Clinician [name] Coria, RN Compression Type: Four Layer Post Procedure Diagnosis Same as Pre-procedure Electronic Signature(s) Signed: 04/07/2021 2:51:09 PM By: [name] Coria RN Entered By: [name] on 04/05/2021 10:41:27 [name] ([id number]) -------------------------------------------------------------------------------- Compression Therapy Details Patient Name: [name] Date of Service: 04/05/2021 10:15 AM Medical Record Number: [id number] Patient Account Number: [id number] Date of Birth/Sex: Oct 24, 1950 (70 y.o. M) Treating RN: [name] Primary Care Itati Brocksmith: Felipa Eth Other Clinician: Referring Yotam Rhine: Felipa Eth Treating Sora Olivo/Extender: Skipper Cliche in Treatment: 9 Compression Therapy Performed for Wound Assessment: NonWound Condition Lymphedema - Right Leg Performed By: Clinician [name] Coria, RN Compression Type: Four Layer Post Procedure Diagnosis Same as Pre-procedure Electronic Signature(s) Signed: 04/07/2021 2:51:09 PM By: [name] Coria  RN Entered By: [name] on 04/05/2021 10:41:43 [name] ([id number]) -------------------------------------------------------------------------------- Encounter Discharge Information Details Patient Name: [name] Date of Service: 04/05/2021 10:15 AM Medical Record Number: [id number] Patient Account Number: [id number] Date of Birth/Sex: Aug 31, 1950 (70 y.o. M) Treating RN: [name] Primary Care Denario Bagot: Felipa Eth Other Clinician: Referring Aydden Cumpian: Felipa Eth Treating Brennan Litzinger/Extender: Skipper Cliche in Treatment: 9 Encounter Discharge Information Items Discharge Condition: Stable Ambulatory Status: Cane Discharge Destination: Home Transportation: Private Auto Accompanied By: self Schedule Follow-up Appointment: Yes Clinical Summary of Care: Patient Declined Electronic Signature(s) Signed: 04/07/2021 2:51:09 PM By: [name] Coria RN Entered By: [name] on 04/05/2021 10:44:02 [name] ([id number]) -------------------------------------------------------------------------------- Lower Extremity Assessment Details Patient Name: [name] Date of Service: 04/05/2021 10:15 AM Medical Record Number: [id number] Patient Account Number: [id number] Date of Birth/Sex: 09-05-1950 (70 y.o. M) Treating RN: [name] Primary Care Kaydin Labo: Felipa Eth Other Clinician: Referring Amiera Herzberg: Felipa Eth Treating Bless Lisenby/Extender: Jeri Cos Weeks in Treatment: 9 Edema Assessment Assessed: [Left: No] [Right: No] [Left: Edema] [Right: :] Calf Left: Right: Point of Measurement: 35 cm From Medial Instep 44 cm 42 cm Ankle Left: Right: Point of Measurement: 10 cm From Medial Instep 47 cm 36 cm Electronic Signature(s) Signed: 04/07/2021 2:51:09 PM By: [name] Coria RN Entered By: [name] on 04/05/2021 10:24:02 [name] ([id number]) -------------------------------------------------------------------------------- Multi Wound Chart  Details Patient Name: [name] Date of Service: 04/05/2021 10:15 AM Medical Record Number: [id number] Patient Account Number: [id number] Date of Birth/Sex: May 20, 1950 (70 y.o. 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 Comorbid History: Hypertension, Peripheral Venous Hypertension, Peripheral Venous N/A Disease, Type II Diabetes, Disease, Type II Diabetes, Neuropathy, Received Neuropathy, Received Chemotherapy Chemotherapy Date Acquired: 03/08/2021 03/26/2021 N/A Weeks of Treatment: 4 1 N/A Wound Status: Open Healed - Epithelialized N/A Measurements L x W x D (cm) 16x27x0.2 0x0x0 N/A Area (cm) :  339.292 0 N/A Volume (cm) : 67.858 0 N/A % Reduction in Area: -4220.00% 100.00% N/A % Reduction in Volume: -1628.00% 100.00% N/A Classification: Full Thickness Without Exposed Grade 2 N/A Support Structures Exudate Amount: Large None Present N/A Exudate Type: Serous N/A N/A Exudate Color: amber N/A N/A Granulation Amount: Medium (34-66%) None Present (0%) N/A Granulation Quality: Pink N/A N/A Necrotic Amount: Medium (34-66%) None Present (0%) N/A Exposed Structures: Fat Layer (Subcutaneous Tissue): Fascia: No N/A Yes Fat Layer (Subcutaneous Tissue): Fascia: No No Tendon: No Tendon: No Muscle: No Muscle: No Joint: No Joint: No Bone: No Bone: No Epithelialization: None Large (67-100%) N/A Treatment Notes Electronic Signature(s) Signed: 04/07/2021 2:51:09 PM By:  [name] Coria RN Entered By: [name] on 04/05/2021 10:40:58 [name] ([id number]) -------------------------------------------------------------------------------- [place] Details Patient Name: [name] Date of Service: 04/05/2021 10:15 AM Medical Record Number: [id number] Patient Account Number: [id number] Date of Birth/Sex: 06/19/1950 (70 y.o. M) Treating RN: [name] Primary Care Katalaya Beel: Felipa Eth Other Clinician: Referring Sibel Khurana: Felipa Eth Treating Lowen Mansouri/Extender: Skipper Cliche in Treatment: 9 Active Inactive Wound/Skin Impairment Nursing Diagnoses: Knowledge deficit related to ulceration/compromised skin integrity Goals: Patient/caregiver will verbalize understanding of skin care regimen Date Initiated: 01/26/2021 Target Resolution Date: 03/28/2021 Goal Status: Active Interventions: Assess patient/caregiver ability to obtain necessary supplies Assess patient/caregiver ability to perform ulcer/skin care regimen upon admission and as needed Assess ulceration(s) every visit Notes: Electronic Signature(s) Signed: 04/07/2021 2:51:09 PM By: [name] Coria RN Entered By: [name] on 04/05/2021 10:40:45 [name] ([id number]) -------------------------------------------------------------------------------- Pain Assessment Details Patient Name: [name] Date of Service: 04/05/2021 10:15 AM Medical Record Number: [id number] Patient Account Number: [id number] Date of Birth/Sex: [birth year] (70 y.o. M) Treating RN: [name] Primary Care Lattie Riege: Felipa Eth Other Clinician: Referring Carrol Hougland: Felipa Eth Treating Alba Perillo/Extender: Skipper Cliche in Treatment: 9 Active Problems Location of Pain Severity and Description of Pain Patient Has Paino No Site Locations Pain Management and Medication Current Pain Management: Electronic Signature(s) Signed: 04/07/2021 2:51:09 PM By: [name] Coria  RN Entered By: [name] on 04/05/2021 10:10:16 [name] ([id number]) -------------------------------------------------------------------------------- Patient/Caregiver Education Details Patient Name: [name] Date of Service: 04/05/2021 10:15 AM Medical Record Number: [id number] Patient Account Number: [id number] Date of Birth/Gender: June 11, 1950 (70 y.o. M) Treating RN: [name] Primary Care Physician: Felipa Eth Other Clinician: Referring Physician: Felipa Eth Treating Physician/Extender: Skipper Cliche in Treatment: 9 Education Assessment Education Provided To: Patient Education Topics Provided Wound/Skin Impairment: Methods: Explain/Verbal Responses: State content correctly Electronic Signature(s) Signed: 04/07/2021 2:51:09 PM By: [name] Coria RN Entered By: [name] on 04/05/2021 10:43:15 [name] ([id number]) -------------------------------------------------------------------------------- Wound Assessment Details Patient Name: [name] Date of Service: 04/05/2021 10:15 AM Medical Record Number: [id number] Patient Account Number: [id number] Date of Birth/Sex: 1950/11/16 (70 y.o. M) Treating RN: [name] Primary Care Overton Boggus: Felipa Eth Other Clinician: Referring Laporcha Marchesi: Felipa Eth Treating Yurika Pereda/Extender: Jeri Cos Weeks in Treatment: 9 Wound Status Wound Number: 15 Primary Lymphedema Etiology: Wound Location: Left, Medial Lower Leg Wound Open Wounding Event: Blister Status: Date Acquired: 03/08/2021 Comorbid Hypertension, Peripheral Venous Disease, Type II Weeks Of Treatment: 4 History: Diabetes, Neuropathy, Received Chemotherapy Clustered Wound: No Photos Wound Measurements Length: (cm) 16 Width: (cm) 27 Depth: (cm) 0.2 Area: (cm) 339.292 Volume: (cm) 67.858 % Reduction in Area: -4220% % Reduction in Volume: -1628% Epithelialization: None Tunneling: No Undermining: No Wound  Description Classification: Full Thickness Without Exposed Support Structu Exudate Amount: Large Exudate Type: Serous Exudate Color: amber res Foul Odor After Cleansing: No  Slough/Fibrino Yes Wound Bed Granulation Amount: Medium (34-66%) Exposed Structure Granulation Quality: Pink Fascia Exposed: No Necrotic Amount: Medium (34-66%) Fat Layer (Subcutaneous Tissue) Exposed: Yes Necrotic Quality: Adherent Slough Tendon Exposed: No Muscle Exposed: No Joint Exposed: No Bone Exposed: No Treatment Notes Wound #15 (Lower Leg) Wound Laterality: Left, Medial Cleanser Peri-Wound Care Topical Primary Dressing YANCY, KNOBLE ([id number]) Secondary Dressing Zetuvit Plus Silicone Non-bordered 5x5 (in/in) Discharge Instruction: please do not cut zetuvit as they are not made to be cut causes leak and release fluid collected Secured With Compression Wrap Medichoice 4 layer Compression System, 35-40 mmHG Discharge Instruction: Apply multi-layer wrap as directed. Compression Stockings Add-Ons Electronic Signature(s) Signed: 04/07/2021 2:51:09 PM By: [name] Coria RN Entered By: [name] on 04/05/2021 10:23:09 [name] ([id number]) -------------------------------------------------------------------------------- Wound Assessment Details Patient Name: [name] Date of Service: 04/05/2021 10:15 AM Medical Record Number: [id number] Patient Account Number: [id number] Date of Birth/Sex: 06-12-1950 (69 y.o. M) Treating RN: [name] Primary Care Minetta Krisher: Felipa Eth Other Clinician: Referring Rielyn Krupinski: Felipa Eth Treating Thurl Boen/Extender: Skipper Cliche in Treatment: 9 Wound Status Wound Number: 16 Primary Diabetic Wound/Ulcer of the Lower Extremity Etiology: Wound Location: Right, Posterior Lower Leg Wound Healed - Epithelialized Wounding Event: Blister Status: Date Acquired: 03/26/2021 Comorbid Hypertension, Peripheral Venous Disease, Type II Weeks Of  Treatment: 1 History: Diabetes, Neuropathy, Received Chemotherapy Clustered Wound: No Photos Wound Measurements Length: (cm) 0 % R Width: (cm) 0 % R Depth: (cm) 0 Epi Area: (cm) 0 Tu Volume: (cm) 0 Un eduction in Area: 100% eduction in Volume: 100% thelialization: Large (67-100%) nneling: No dermining: No Wound Description Classification: Grade 2 Fo Exudate Amount: None Present Sl ul Odor After Cleansing: No ough/Fibrino No Wound Bed Granulation Amount: None Present (0%) Exposed Structure Necrotic Amount: None Present (0%) Fascia Exposed: No Fat Layer (Subcutaneous Tissue) Exposed: No Tendon Exposed: No Muscle Exposed: No Joint Exposed: No Bone Exposed: No Treatment Notes Wound #16 (Lower Leg) Wound Laterality: Right, Posterior Cleanser Peri-Wound Care Topical Primary Dressing GAUGE, WINSKI ([id number]) Secondary Dressing Secured With Compression Wrap Compression Stockings Add-Ons Electronic Signature(s) Signed: 04/07/2021 2:51:09 PM By: [name] Coria RN Entered By: [name] on 04/05/2021 10:23:40 [name] ([id number]) -------------------------------------------------------------------------------- Vitals Details Patient Name: [name] Date of Service: 04/05/2021 10:15 AM Medical Record Number: [id number] Patient Account Number: [id number] Date of Birth/Sex: [birth year] (70 y.o. M) Treating RN: [name] Primary Care Kalik Hoare: Felipa Eth Other Clinician: Referring Tzion Wedel: Felipa Eth Treating Shawnese Magner/Extender: Skipper Cliche in Treatment: 9 Vital Signs Time Taken: 10:09 Temperature (F): 98.2 Height (in): 73 Pulse (bpm): 86 Weight (lbs): 312 Respiratory Rate (breaths/min): 18 Body Mass Index (BMI): 41.2 Blood Pressure (mmHg): 121/70 Reference Range: 80 - 120 mg / dl Electronic Signature(s) Signed: 04/07/2021 2:51:09 PM By: [name] Coria RN Entered By: [name] on 04/05/2021 10:10:07

## 2021-04-13 ENCOUNTER — Encounter: Payer: No Typology Code available for payment source | Attending: Physician Assistant | Admitting: Physician Assistant

## 2021-04-13 ENCOUNTER — Other Ambulatory Visit: Payer: Self-pay

## 2021-04-13 DIAGNOSIS — I872 Venous insufficiency (chronic) (peripheral): Secondary | ICD-10-CM | POA: Diagnosis not present

## 2021-04-13 DIAGNOSIS — I890 Lymphedema, not elsewhere classified: Secondary | ICD-10-CM | POA: Insufficient documentation

## 2021-04-13 DIAGNOSIS — E11622 Type 2 diabetes mellitus with other skin ulcer: Secondary | ICD-10-CM | POA: Insufficient documentation

## 2021-04-13 DIAGNOSIS — I110 Hypertensive heart disease with heart failure: Secondary | ICD-10-CM | POA: Insufficient documentation

## 2021-04-13 DIAGNOSIS — I5040 Unspecified combined systolic (congestive) and diastolic (congestive) heart failure: Secondary | ICD-10-CM | POA: Diagnosis not present

## 2021-04-13 DIAGNOSIS — L97512 Non-pressure chronic ulcer of other part of right foot with fat layer exposed: Secondary | ICD-10-CM | POA: Diagnosis not present

## 2021-04-13 DIAGNOSIS — L97811 Non-pressure chronic ulcer of other part of right lower leg limited to breakdown of skin: Secondary | ICD-10-CM | POA: Diagnosis not present

## 2021-04-13 DIAGNOSIS — L97822 Non-pressure chronic ulcer of other part of left lower leg with fat layer exposed: Secondary | ICD-10-CM | POA: Insufficient documentation

## 2021-04-13 DIAGNOSIS — I10 Essential (primary) hypertension: Secondary | ICD-10-CM | POA: Diagnosis not present

## 2021-04-13 NOTE — Progress Notes (Addendum)
KEN, BONN ([id number]) Visit Report for 04/13/2021 Chief Complaint Document Details Patient Name: [name], [name] Date of Service: 04/13/2021 11:00 AM Medical Record Number: [id number] Patient Account Number: [id number] Date of Birth/Sex: 1950/06/09 (70 y.o. M) Treating RN: [name] Primary Care Provider: Felipa Eth Other Clinician: Referring Provider: Felipa Eth Treating Provider/Extender: Skipper Cliche in Treatment: 11 Information Obtained from: Patient Chief Complaint Left LE ulcers Electronic Signature(s) Signed: 04/13/2021 10:58:52 AM By: Worthy Keeler PA-C Entered By: Worthy Keeler on 04/13/2021 10:58:52 [name] ([id number]) -------------------------------------------------------------------------------- HPI Details Patient Name: [name] Date of Service: 04/13/2021 11:00 AM Medical Record Number: [id number] Patient Account Number: [id number] Date of Birth/Sex: [birth year] (70 y.o. M) Treating RN: [name] Primary Care Provider: Felipa Eth Other Clinician: Referring Provider: Felipa Eth Treating Provider/Extender: Skipper Cliche in Treatment: 11 History of Present Illness HPI Description: 70 year old male who presented to the ER with bilateral lower extremity blisters which had started last week. he has a past medical history of leukemia, diabetes mellitus, hypertension, edema of both lower extremities, his recurrent skin infections, peripheral vascular disease, coronary artery disease, congestive heart failure and peripheral neuropathy. in the ER he was given Rocephin and put on Silvadene cream. he was put on oral doxycycline and was asked to follow-up with the [facility]. His last hemoglobin A1c was 6.6 in December and he checks his blood sugar once a week. He does not have any physicians outside the [place] system. He does not recall any vascular duplex studies done either for arterial or venous disease but was told to wear  compression stockings which he does not use 05/30/2016 -- we have not yet received any of his notes from the [facility] hospital system and his arterial and venous duplex studies are scheduled here in [place] around mid February. We are unable to have his insurance accepted by home health agencies and hence he is getting dressings only once a week. 06/06/16 -- -- I received a call from the patient's PCP at the [facility] at [facility] and spoke to Dr. Garvin Fila, phone number [phone] and fax number [phone]. She confirmed that no vascular testing was done over the last 5 years and she would be happy to do them if the patient did want them to be done at the [place] and we could fax him a request. Readmission: 70 year old male seen by as in February of this year and was referred to vein and vascular for studies and opinion from the vascular surgeons. The patient returns today with a fresh problem having had blisters on his left lower extremity which have been there for about 5 days and he clearly states that he has been wearing his compression stockings as advised though he could not read the moderate compression and has been wearing light compression. Review of his electronic medical records note that he had lower extremity arterial duplex examination done on 06/23/2016 which showed no hemodynamically significant stenosis in the bilateral lower extremity arterial system. He also had a lower extremity venous reflux examination done on 07/07/2016 and it was noted that he had venous incompetence in the right great saphenous vein and bilateral common femoral veins. Patient was seen by Dr. Tamala Julian on the same day and for some reason his notes do not reflect the venous studies or the arterial studies and he recommended patient do a venous duplex ultrasound to look for reflux and return to see him.he would also consider a lymph pump if required. The patient was told that his workup  was normal and hence the patient  canceled his follow-up appointment. 02/03/17 on evaluation today patient left medial lower extremity blister appears to be doing about the same. It is still continuing to drain and there's still the blistered skin covering the wound bed which is making it difficult for the alternate to do its job. Fortunately there is no evidence of cellulitis. No fevers chills noted. Patient states in general he is not having any significant discomfort. Patient's lower extremity arterial duplex exam revealed that patient was hemodynamically stable with no evidence of stenosis in regard to the bilateral lower extremities. The lower extremity venous reflux exam revealed the patient had venous incontinence noted in the right greater saphenous and bilateral common femoral vein. There is no evidence of deep or superficial vein thrombosis in the bilateral lower extremities. Readmission: 11/12/18 Patient presents for evaluation our clinic today concerning issues that he is having with his left lower extremity. He tells me that a couple weeks ago he began developing blisters on the left lower extremity along with increased swelling. He typically wears his compression stockings on a regular basis is previously been evaluated both here as well is with vascular surgery they would recommend lymphedema pumps but unfortunately that somehow fell through and he never heard anything back from that. Nonetheless I think lymphedema pumps would be beneficial for this patient. He does have a history of hypertension and diabetes. Obviously the chronic venous stasis and lymphedema as well. At this point the blisters have been given in more trouble he states sometimes when the blisters openings able to clean it down with alcohol and it will dry out and do well. Unfortunately that has not been the case this time. He is having some discomfort although this mean these with cleaning the areas he doesn't have discomfort just on a regular basis. He  has not been able to wear his compression stockings since the blisters arose due to the fact that of course it will drain into the socks causing additional issues and he didn't have any way to wrap this otherwise. He has increased to taking his Lasix every day instead of every other day. He sees his primary care provider later this month as well. No fevers, chills, nausea, or vomiting noted at this time. 11/19/18-Patient returns at 1 week, per intake RN the amount of seepage into the compression wraps was definitely improved, overall all the wounds are measuring smaller but continuing silver alginate to the wounds as primary dressing 11/26/18 on evaluation today patient appears to be doing quite well in regard to his left lower [place] ulcers. In fact of the areas that were noted initially he only has two regions still open. There is no evidence of active infection at this time. He still is not heard anything from the company regarding lymphedema pumps as of yet. Again as previously seen vascular they have not recommended any surgical intervention. 12/03/2018 on evaluation today patient actually appears to be doing quite well with regard to his lower extremity ulcers. In fact most of the areas appear to be healed the one spot which does not seem to be completely healed I am unsure of whether or not this is really draining that much but nonetheless there does not appear to be any signs of infection or significant drainage at this point. There is no sign of fever, chills, nausea, vomiting, or diarrhea. Overall I am pleased with how things have progressed I think is very close to being able to transition  to his home compression stockings. FIRAS, GUARDADO ([id number]) 12/10/2018 upon evaluation today patient appears to be doing quite well with regard to his left lower extremity. He has been tolerating the dressing changes without complication. Fortunately there is no signs of active infection at this time. He  appears after thorough evaluation of his leg to only have 1 small area that remains open at this point everything else appears to be almost completely closed. He still have significant swelling of the left lower extremity. We had discussed discussing this with his primary care provider he is not able to see her in person they were at the [facility] and right now the [place] is not seeing patients on site. According to the patient anyway. Subsequently he did speak with her apparently and his primary care provider feels that he may likely have a DVT. With that being said she has not seen his leg she is just going off of his history. Nonetheless that is a concern that the patient now has as well and while I do not feel the DVT is likely we can definitely ensure that that is not the case I will go ahead and see about putting that order in today. Nonetheless otherwise I am in a recommend that we continue with the current wound care measures including the compression therapy most likely. We just need to ensure that his leg is indeed free of any DVTs. 12/17/2018 on evaluation today patient actually appears to be completely healed today. He does have 2 very small areas of blistering although this is not anything too significant at this point which is good news. With that being said I am in agreement with the fact that I think he is completely healed at this point. He does want to get back into his compression stocking. The good news is we have gotten approval from insurance for his lymphedema pumps we received a letter since last saw him last week. The other good news is his study did come back and showed no evidence of a DVT. 12/20/2018 on evaluation today patient presents for follow-up concerning his ongoing issues with his left lower extremity. He was actually discharged last Friday and did fairly well until he states blisters opened this morning. He tells me he has been wearing his compression stocking although  he has a hard time getting this on. There does not appear to be any signs of active infection at this time. No fevers, chills, nausea, vomiting, or diarrhea. 12/27/2018 on evaluation today patient appears to be doing very well with regard to his swelling of the left lower extremity the 4 layer compression wrap seems to have been beneficial for him. Fortunately there is no signs of active infection at this time. Patient has been tolerating the compression wrap without complication and his foot swelling in particular appears to be greatly improved. He does still have a wound on the lateral portion of his left leg I believe this is more of a blister that has now reopened. 01/03/2019 on evaluation today patient actually appears to be doing excellent in regard to his left lower extremity. He did receive his compression pumps and is actually use this 7 times since he was last here in the office. On top of the compression wrap he is now roughly 3 cm better at the calf and 2 cm better at the ankle he also states that his foot seem to go an issue better without even having to use a shoe horn. Obviously I  think this is all evidence that he is doing excellent in this regard. The other good news is he does not appear to have anything open today as far as wounds are concerned. 01/15/2019 on evaluation today patient appears to be doing more poorly yet again with regard to his left lower extremity. He has developed new wounds again after being discharged just recently. Unfortunately this continues to be the case that he will heal and then have subsequent new wounds. The last time I was hopeful that he may not end up coming back too quickly especially since he states he has been using his lymphedema pumps along with wearing his compression. Nonetheless he had a blister on the back of his leg that popped up on the left and this has opened up into an ulceration it is quite painful. 01/22/19 on evaluation today patient  actually appears to be doing well with regard to his wound on the left lower extremity. He's been tolerating the dressing changes without complication including the compression wrap in the wound appears to be significantly smaller today which is great news. Overall very pleased in this regard. 01/29/2019 on evaluation today patient appears to be doing well with regard to his left posterior lower extremity ulcer. He has been tolerating the dressing changes without complication. This is not completely healed but is getting much closer. We did order a Farrow wrap 4000 for him he has received this and has it with him today although I am not sure we are quite ready to start him on that as of yet. We are very close. 02/05/2019 on evaluation today patient actually appears to be doing quite well with regard to his left posterior lower extremity ulcer. He still has a very tiny opening remaining but the fortunate thing is he seems to be healing quite nicely. He also did get his Farrow wrap which I am hoping will help with his edema control as well at home. Fortunately there is no evidence of active infection. 02/12/2019 patient and fortunately appears to be doing poorly in regard to his wounds of the left lower extremity. He was very close to healing therefore we attempted to use his Velcro compression wraps continuing with lymphedema pumps at home. Unfortunately that does not seem to have done very well for him. He tells me that he wore them all the time but again I am not sure why if that is the case that he is having such significant edema. He is still on his fluid pills as well. With that being said there is no obvious sign of infection although I do wonder about the possibility of infection at this time as well. 02/19/2019 unfortunately upon evaluation today patient appears to be doing more poorly with regard to his left lower extremity. He is not showing signs of significant improvement and I think the  biggest issue here is that he does have an infection that appears to likely be Pseudomonas. That is based on the blue-green drainage that were noted at this time. Unfortunately the antibiotic that has been on is not going to take care of this at all. I think they will get a need to switch him to either Levaquin or Cipro and this was discussed with the patient. 02/26/2019 on evaluation today patient's lower extremity on the left appears to be doing significantly better as compared to last evaluation. Fortunately there is no signs of active infection at this time. He has been tolerating the compression wrap without complication in  fact he made it the whole week at this point. He is showing signs of excellent improvement I am very happy in this regard. With that being said he is having some issues with infection we did review the results of his culture which I noted today. He did have a positive finding for Enterobacter as well as Alcaligenes faecalis. Fortunately the Levaquin that I placed him on will work for both which is great news. There is no signs of systemic infection at this point. 10/30; left posterior leg wound in the setting of very significant edema and what looks like chronic venous inflammation. He has compression pumps but does not use them. We have been using 3 layer compression. Silver alginate to the wound as the primary dressing 03/18/2019 on evaluation today patient appears to be doing a little better compared to last time I saw him. He really has not been using his compression pumps he tells me that he is having too much discomfort. He has been keeping his wraps on however. He is only been taking his fluid pills every other day because he states they are not really helping and he has an appointment with his primary care provider at the [facility] tomorrow. Subsequently the wound itself on the left lower extremity does seem to be greatly improved compared to previous. 03/25/2019 on evaluation  today patient appears to be doing better with regard to his wounds on the bilateral lower extremities. The left is doing excellent the right is also doing better although both still do show some signs of open wounds noted at this point unfortunately. Fortunately there is no signs of active infection at this time. The patient also is not really having any significant pain which is good news. Unfortunately there was some confusion with the referral on vascular disease and as far as getting the patient scheduled there can be contacting him later today to do this MONTOYA, BRANDEL ([id number]) fortunately we got this straightened out. 04/01/2019 on evaluation today patient appears to be doing no fevers, chills, nausea, vomiting, or diarrhea. Excellent at this time with regard to his lower extremities. There does not appear to be any open wound at this point which is good news. Fortunately is also no signs of active infection at this time. Overall feel like the patient has done excellent with the compression the problem is every time we got him to this point and then subsequently go to using his own compression things just go right back to where they were. I am not sure how to address this we can try to get an appointment with vascular for 2 weeks now they have yet to call him. Obviously this has become frustrating for the patient as well. I think the issue has just been an honest error as far as scheduling is concerned but nonetheless still worn out the point where I am unsure of which direction we should take. 04/08/2019 on evaluation today patient actually appears to be doing well with regard to his lower extremities. There are no open wounds at this time and things seem to be managing quite nicely as far as the overall edema control is concerned. With that being said he does have his compression socks today for [place] to go ahead and reinitiate therapy in that manner at this point. He is going to be going for  shoes to be measured on Wednesday and then coincidentally he will also be seeing vascular on Thursday. Overall I think this is good news and  again I am hopeful that they will be able to do something for him to help prevent ongoing issues with edema control as well. No fevers, chills, nausea, vomiting, or diarrhea. 04/11/2019 on evaluation today patient actually appears to be doing poorly after just being discharged on Monday of this week. He had been experiencing issues with again blisters especially on the left lower extremity. With that being said he was completely healed and appeared to be doing great this past Monday. He then subsequently has new blisters that formed before his appointment with vascular this morning. He was also measured for shoes in the interim. With that being said we may have figured out what exactly is going on and why he continues to have issues like what we are seeing at this point. He takes his compression stockings off at nighttime and then he ends up having to sleep in his chair for 5-6 hours a night. He sleeps with his feet down he cannot really get him up in the recliner and therefore he is sleeping and the worst possible his position with his feet on the floor for that majority of the time. Again as I explained to him that is about one third at minimum at least one fourth of his day that he spending with his feet dangling down on the ground and the worst possible position they could be. I think this may be what is causing the issue. Subsequently I am leaning toward thinking that he may need a hospital bed in order to elevate his legs. We likely can have to coordinate this with his primary care provider at the [facility]. Readmission: 01/26/2021 this is a patient who presents for repeat evaluation here in the clinic although it is actually been couple of years since have seen him in fact it was December 2020 when I last saw him. Subsequently he never really healed but  did end up being lost to follow-up. He tells me has been having issues ongoing with his lower extremities has bilateral lower extremity lymphedema no real significant or definitive open wounds but in general his lymphedema is way out of control. We were never able to refer him to lymphedema clinic simply due to the fact to be honest we were never able to get him completely healed. I do not see anyone with open wounds. The patient does have evidence of type 2 diabetes mellitus, lymphedema, chronic venous insufficiency, and hypertension. That really has not changed since his last evaluation. 02/09/2021 upon evaluation today patient appears to be doing a little better in regard to his legs although he still having a tremendous amount of drainage especially on the left leg. Fortunately there does not appear to be any evidence of active infection. Of note when we looked into this further it appears that the patient did not have any absorptive dressing on it was just the 4-layer compression wrap. Nonetheless this is probably big part of the issue here. 10/10; he comes in today with 3 large areas on the upper right lower leg likely remanence of denuded blistering under his compression wraps. He has no other wounds on the right. On the left he has the denuded area on the left medial foot and ankle and on the left dorsal foot. Massive lymphedema in both feet dorsally. Using Zetuvit under compression We have increased home health visitation to twice a week to change the dressings and will change it once 02/22/2021 upon evaluation today patient appears to be doing well currently  with regard to his wounds. He has been tolerating the dressing changes without complication. Fortunately there does not appear to be any evidence of active infection which is great news. No fevers, chills, nausea, vomiting, or diarrhea. The biggest issue I see currently is that home health is not putting any medicine on the actual wounds  before wrapping. 03/01/2021 upon evaluation today the patient's right leg actually appears to be doing quite well which is great news there does not appear to be any evidence of active infection at this time. No fevers, chills, nausea, vomiting, or diarrhea. With that being said the patient is having issues on the left foot where he is having significant drainage is also an ammonia smell he does not have any animals at home and this makes me concerned about a bacteria producing urea as a byproduct. Again the possible common organisms will be E. coli, Proteus, and Enterococcus. All 3 of which can be successfully treated with Levaquin. For that reason I think that this may be a good option for [place] to consider placing him on and I did obtain a culture as well for confirmation sake. 03/08/2021 upon evaluation today patient appears to be doing unfortunately still somewhat poorly in regard to his leg ulcerations. He actually has an area on the right leg where he blistered due to the fact that his wrap slid down and caused an area of pinching on his skin and this has led to a significant issue here. 03/15/2021 upon evaluation today patient unfortunately has not been wrapped appropriately with absorptive dressings nor with the appropriate technique for the third layer of the 4-layer compression wrap. These are issues that we continue to try to address with the home health nurse. Also the absorptive dressing that she had was cut in half and therefore that causes things to leak out it does not actually trap the fluid in regard to the top of the foot overall I think that all these combined are really not seeing things improved significantly here. Fortunately there does not appear to be any signs of significant infection at this time which is good news. He still is having a tremendous amount of drainage. 03/22/2021 upon evaluation today patient appears to be draining tremendously. He still continues to tell me  that he is using his pumps 2 times a day and that coupled with that tells me that he is elevating his legs as well. With that being said all things considered I am really just not seeing the improvement we would expect to see with the 4-layer compression wrap and all the above noted. He in fact had an extremely large Zetuvit dressing on both legs and that they were extremely filled to the max with fluid. This is after just being changed just before the weekend and this is Monday. Nonetheless I am concerned about the fact that there is something going on fairly significant that we cannot get any of this under control and that he is draining this significantly. He supposed be having an echocardiogram it sounds like scheduling has been an issue for him as far as getting in sooner. Its something to do with needing his cousin to drive him because of where it sat and he cannot drive himself to this appointment either way I really think he needs to try to see what he can do about making this happen a little sooner. He tells me he will call today. ANDRES, BANTZ ([id number]) 03/29/2021 on evaluation today patient appears to be  doing about the same in regard to his legs. He did get his cardiology appointment moved up to 6 December which is at least good that is better than what it was before mid December. Overall very pleased in that regard. 04/05/2021 upon evaluation today patient unfortunately is still doing fairly poorly. There does not appear to be any signs of active infection at this time. No fevers, chills, nausea, vomiting, or diarrhea. Unfortunately I think until his edema is under control and overall fluid overload there is really not to be much chance that I can do much to get him better. This is quite unfortunate and frustrating both for myself and the patient to be perfectly honest. Nonetheless I think that he really needs to have a conversation both with his primary care provider as well as  cardiologist he sees the PCP on Monday and cardiology on Wednesday of next week. 04/13/2021 upon evaluation today patient appears to be doing poorly in regard to his bilateral lower extremities his left is still worse than the right. With that being said he has a tremendous amount of drainage he did see his primary care provider yesterday there really was not much there to be done from their perspective. He sees cardiology tomorrow. Nonetheless my biggest concern here is simply that if we do not get the edema under control he is going to continue to have drainage and honestly I think at some point he is going to become infected severely that is my main concern. Electronic Signature(s) Signed: 04/13/2021 11:56:47 AM By: Worthy Keeler PA-C Entered By: Worthy Keeler on 04/13/2021 11:56:47 [name] ([id number]) -------------------------------------------------------------------------------- Physical Exam Details Patient Name: PEDROHENRIQUE, MCCONVILLE Date of Service: 04/13/2021 11:00 AM Medical Record Number: [id number] Patient Account Number: [id number] Date of Birth/Sex: 12/03/1950 (70 y.o. M) Treating RN: [name] Primary Care Provider: Felipa Eth Other Clinician: Referring Provider: Felipa Eth Treating Provider/Extender: Skipper Cliche in Treatment: 11 Constitutional Obese and well-hydrated in no acute distress. Respiratory normal breathing without difficulty. Psychiatric this patient is able to make decisions and demonstrates good insight into disease process. Alert and Oriented x 3. pleasant and cooperative. Notes Upon inspection patient unfortunately continues to have significant issues with drainage from both legs left greater than right. This is to the degree that I am not even certain we will get a be able to manage this on an outpatient basis. I will to see what cardiology recommends tomorrow. However if that is not something that they can do or figure out to  effectively reduce his edema I think were probably can have to proceed with ER evaluation for potential hospital admission to get this under control. Electronic Signature(s) Signed: 04/13/2021 11:57:36 AM By: Worthy Keeler PA-C Entered By: Worthy Keeler on 04/13/2021 11:57:35 [name] ([id number]) -------------------------------------------------------------------------------- Physician Orders Details Patient Name: [name] Date of Service: 04/13/2021 11:00 AM Medical Record Number: [id number] Patient Account Number: [id number] Date of Birth/Sex: 09-Mar-1951 (70 y.o. M) Treating RN: [name] Primary Care Provider: Felipa Eth Other Clinician: Referring Provider: Felipa Eth Treating Provider/Extender: Skipper Cliche in Treatment: 11 Verbal / Phone Orders: No Diagnosis Coding ICD-10 Coding Code Description E11.622 Type 2 diabetes mellitus with other skin ulcer I89.0 Lymphedema, not elsewhere classified I87.2 Venous insufficiency (chronic) (peripheral) L97.822 Non-pressure chronic ulcer of other part of left lower leg with fat layer exposed L97.512 Non-pressure chronic ulcer of other part of right foot with fat layer exposed I10 Essential (primary) hypertension L97.811 Non-pressure chronic ulcer of  other part of right lower leg limited to breakdown of skin Follow-up Appointments o Return Appointment in 1 week. [facility] for wound care. May utilize formulary equivalent dressing for wound treatment orders unless otherwise specified. Home Health Nurse may visit PR[facility] to address patientos wound care needs. - frequency 3 times per week - patient will be seen once at wound center - home health to see patient 2 times per week on Wed and friday Bathing/ Shower/ Hygiene o May shower with wound dressing protected with water repellent cover or cast protector. Edema Control - Lymphedema / Segmental Compressive Device / Other Bilateral Lower  Extremities o Optional: One layer of unna paste to top of compression wrap (to act as an anchor). - Unna paste on feet and calf to secure wrap in place o 4 Layer Compression System Lymphedema. - bi lat, apply zetuvits/ or formulary for extra absorbant dressing around ankles and foot area, 3 times per week COTTON LAYER SPIRAL, LIGHT TAN SPIRAL. WHITE WITH YELLOW LINE FIGURE 8 , COBAN SPIRAL o Elevate, Exercise Daily and Avoid Standing for Long Periods of Time. o Elevate legs to the level of the heart and pump ankles as often as possible o Elevate leg(s) parallel to the floor when sitting. o Compression Pump: Use compression pump on left lower extremity for 60 minutes, twice daily. - Start today o Compression Pump: Use compression pump on right lower extremity for 60 minutes, twice daily. - Start today o DO YOUR BEST to sleep in the bed at night. DO NOT sleep in your recliner. Long hours of sitting in a recliner leads to swelling of the legs and/or potential wounds on your backside. o Other: - Contact prescriber regarding use of diuretics to reduce fluid overload. Off-Loading Bilateral Lower Extremities o Open toe surgical shoe Wound Treatment Wound #15 - Lower Leg Wound Laterality: Left, Medial Secondary Dressing: Zetuvit Plus Silicone Non-bordered 5x5 (in/in) 3 x Per Week/30 Days Discharge Instructions: please do not cut zetuvit as they are not made to be cut causes leak and release fluid collected Compression Wrap: Medichoice 4 layer Compression System, 35-40 mmHG (Generic) 3 x Per Week/30 Days Discharge Instructions: Apply multi-layer wrap as directed. Electronic Signature(s) Signed: 04/13/2021 11:54:18 AM By: [name] RN Marcello Moores, [place] ([id number]) Signed: 04/13/2021 4:19:41 PM By: Worthy Keeler PA-C Entered By: [name] on 04/13/2021 11:54:17 [name] ([id number]) -------------------------------------------------------------------------------- Problem  List Details Patient Name: [name] Date of Service: 04/13/2021 11:00 AM Medical Record Number: [id number] Patient Account Number: [id number] Date of Birth/Sex: Apr 27, 1951 (70 y.o. M) Treating RN: [name] Primary Care Provider: Felipa Eth Other Clinician: Referring Provider: Felipa Eth Treating Provider/Extender: Skipper Cliche in Treatment: 11 Active Problems ICD-10 Encounter Code Description Active Date MDM Diagnosis E11.622 Type 2 diabetes mellitus with other skin ulcer 01/26/2021 No Yes I89.0 Lymphedema, not elsewhere classified 01/26/2021 No Yes I87.2 Venous insufficiency (chronic) (peripheral) 01/26/2021 No Yes L97.822 Non-pressure chronic ulcer of other part of left lower leg with fat layer 01/26/2021 No Yes exposed L97.512 Non-pressure chronic ulcer of other part of right foot with fat layer 01/26/2021 No Yes exposed [place] (primary) hypertension 01/26/2021 No Yes L97.811 Non-pressure chronic ulcer of other part of right lower leg limited to 02/15/2021 No Yes breakdown of skin Inactive Problems Resolved Problems Electronic Signature(s) Signed: 04/13/2021 10:58:47 AM By: Worthy Keeler PA-C Entered By: Worthy Keeler on 04/13/2021 10:58:46 [name] ([id number]) -------------------------------------------------------------------------------- Progress Note Details Patient Name: [name] Date of  Service: 04/13/2021 11:00 AM Medical Record Number: [id number] Patient Account Number: [id number] Date of Birth/Sex: April 14, 1951 (70 y.o. M) Treating RN: [name] Primary Care Provider: Felipa Eth Other Clinician: Referring Provider: Felipa Eth Treating Provider/Extender: Skipper Cliche in Treatment: 11 Subjective Chief Complaint Information obtained from Patient Left LE ulcers History of Present Illness (HPI) 70 year old male who presented to the ER with bilateral lower extremity blisters which had started last week. he has a  past medical history of leukemia, diabetes mellitus, hypertension, edema of both lower extremities, his recurrent skin infections, peripheral vascular disease, coronary artery disease, congestive heart failure and peripheral neuropathy. in the ER he was given Rocephin and put on Silvadene cream. he was put on oral doxycycline and was asked to follow-up with the [facility] His last hemoglobin A1c was 6.6 in December and he checks his blood sugar once a week. He does not have any physicians outside the [place] system. He does not recall any vascular duplex studies done either for arterial or venous disease but was told to wear compression stockings which he does not use 05/30/2016 -- we have not yet received any of his notes from the [facility] hospital system and his arterial and venous duplex studies are scheduled here in [place] around mid February. We are unable to have his insurance accepted by home health agencies and hence he is getting dressings only once a week. 06/06/16 -- -- I received a call from the patient's PCP at the [facility] at [facility] and spoke to Dr. Garvin Fila, phone number [phone] and fax number [phone]. She confirmed that no vascular testing was done over the last 5 years and she would be happy to do them if the patient did want them to be done at the [place] and we could fax him a request. Readmission: 70 year old male seen by as in February of this year and was referred to vein and vascular for studies and opinion from the vascular surgeons. The patient returns today with a fresh problem having had blisters on his left lower extremity which have been there for about 5 days and he clearly states that he has been wearing his compression stockings as advised though he could not read the moderate compression and has been wearing light compression. Review of his electronic medical records note that he had lower extremity arterial duplex examination done on 06/23/2016 which showed  no hemodynamically significant stenosis in the bilateral lower extremity arterial system. He also had a lower extremity venous reflux examination done on 07/07/2016 and it was noted that he had venous incompetence in the right great saphenous vein and bilateral common femoral veins. Patient was seen by Dr. Tamala Julian on the same day and for some reason his notes do not reflect the venous studies or the arterial studies and he recommended patient do a venous duplex ultrasound to look for reflux and return to see him.he would also consider a lymph pump if required. The patient was told that his workup was normal and hence the patient canceled his follow-up appointment. 02/03/17 on evaluation today patient left medial lower extremity blister appears to be doing about the same. It is still continuing to drain and there's still the blistered skin covering the wound bed which is making it difficult for the alternate to do its job. Fortunately there is no evidence of cellulitis. No fevers chills noted. Patient states in general he is not having any significant discomfort. Patient's lower extremity arterial duplex exam revealed that patient was hemodynamically stable  with no evidence of stenosis in regard to the bilateral lower extremities. The lower extremity venous reflux exam revealed the patient had venous incontinence noted in the right greater saphenous and bilateral common femoral vein. There is no evidence of deep or superficial vein thrombosis in the bilateral lower extremities. Readmission: 11/12/18 Patient presents for evaluation our clinic today concerning issues that he is having with his left lower extremity. He tells me that a couple weeks ago he began developing blisters on the left lower extremity along with increased swelling. He typically wears his compression stockings on a regular basis is previously been evaluated both here as well is with vascular surgery they would recommend lymphedema pumps  but unfortunately that somehow fell through and he never heard anything back from that. Nonetheless I think lymphedema pumps would be beneficial for this patient. He does have a history of hypertension and diabetes. Obviously the chronic venous stasis and lymphedema as well. At this point the blisters have been given in more trouble he states sometimes when the blisters openings able to clean it down with alcohol and it will dry out and do well. Unfortunately that has not been the case this time. He is having some discomfort although this mean these with cleaning the areas he doesn't have discomfort just on a regular basis. He has not been able to wear his compression stockings since the blisters arose due to the fact that of course it will drain into the socks causing additional issues and he didn't have any way to wrap this otherwise. He has increased to taking his Lasix every day instead of every other day. He sees his primary care provider later this month as well. No fevers, chills, nausea, or vomiting noted at this time. 11/19/18-Patient returns at 1 week, per intake RN the amount of seepage into the compression wraps was definitely improved, overall all the wounds are measuring smaller but continuing silver alginate to the wounds as primary dressing 11/26/18 on evaluation today patient appears to be doing quite well in regard to his left lower [place] ulcers. In fact of the areas that were noted initially he only has two regions still open. There is no evidence of active infection at this time. He still is not heard anything from the company regarding lymphedema pumps as of yet. Again as previously seen vascular they have not recommended any surgical intervention. IANMICHAEL, AMESCUA ([id number]) 12/03/2018 on evaluation today patient actually appears to be doing quite well with regard to his lower extremity ulcers. In fact most of the areas appear to be healed the one spot which does not seem to be  completely healed I am unsure of whether or not this is really draining that much but nonetheless there does not appear to be any signs of infection or significant drainage at this point. There is no sign of fever, chills, nausea, vomiting, or diarrhea. Overall I am pleased with how things have progressed I think is very close to being able to transition to his home compression stockings. 12/10/2018 upon evaluation today patient appears to be doing quite well with regard to his left lower extremity. He has been tolerating the dressing changes without complication. Fortunately there is no signs of active infection at this time. He appears after thorough evaluation of his leg to only have 1 small area that remains open at this point everything else appears to be almost completely closed. He still have significant swelling of the left lower extremity. We had discussed discussing this  with his primary care provider he is not able to see her in person they were at the [facility] and right now the [place] is not seeing patients on site. According to the patient anyway. Subsequently he did speak with her apparently and his primary care provider feels that he may likely have a DVT. With that being said she has not seen his leg she is just going off of his history. Nonetheless that is a concern that the patient now has as well and while I do not feel the DVT is likely we can definitely ensure that that is not the case I will go ahead and see about putting that order in today. Nonetheless otherwise I am in a recommend that we continue with the current wound care measures including the compression therapy most likely. We just need to ensure that his leg is indeed free of any DVTs. 12/17/2018 on evaluation today patient actually appears to be completely healed today. He does have 2 very small areas of blistering although this is not anything too significant at this point which is good news. With that being said I am in  agreement with the fact that I think he is completely healed at this point. He does want to get back into his compression stocking. The good news is we have gotten approval from insurance for his lymphedema pumps we received a letter since last saw him last week. The other good news is his study did come back and showed no evidence of a DVT. 12/20/2018 on evaluation today patient presents for follow-up concerning his ongoing issues with his left lower extremity. He was actually discharged last Friday and did fairly well until he states blisters opened this morning. He tells me he has been wearing his compression stocking although he has a hard time getting this on. There does not appear to be any signs of active infection at this time. No fevers, chills, nausea, vomiting, or diarrhea. 12/27/2018 on evaluation today patient appears to be doing very well with regard to his swelling of the left lower extremity the 4 layer compression wrap seems to have been beneficial for him. Fortunately there is no signs of active infection at this time. Patient has been tolerating the compression wrap without complication and his foot swelling in particular appears to be greatly improved. He does still have a wound on the lateral portion of his left leg I believe this is more of a blister that has now reopened. 01/03/2019 on evaluation today patient actually appears to be doing excellent in regard to his left lower extremity. He did receive his compression pumps and is actually use this 7 times since he was last here in the office. On top of the compression wrap he is now roughly 3 cm better at the calf and 2 cm better at the ankle he also states that his foot seem to go an issue better without even having to use a shoe horn. Obviously I think this is all evidence that he is doing excellent in this regard. The other good news is he does not appear to have anything open today as far as wounds are concerned. 01/15/2019 on  evaluation today patient appears to be doing more poorly yet again with regard to his left lower extremity. He has developed new wounds again after being discharged just recently. Unfortunately this continues to be the case that he will heal and then have subsequent new wounds. The last time I was hopeful that he  may not end up coming back too quickly especially since he states he has been using his lymphedema pumps along with wearing his compression. Nonetheless he had a blister on the back of his leg that popped up on the left and this has opened up into an ulceration it is quite painful. 01/22/19 on evaluation today patient actually appears to be doing well with regard to his wound on the left lower extremity. He's been tolerating the dressing changes without complication including the compression wrap in the wound appears to be significantly smaller today which is great news. Overall very pleased in this regard. 01/29/2019 on evaluation today patient appears to be doing well with regard to his left posterior lower extremity ulcer. He has been tolerating the dressing changes without complication. This is not completely healed but is getting much closer. We did order a Farrow wrap 4000 for him he has received this and has it with him today although I am not sure we are quite ready to start him on that as of yet. We are very close. 02/05/2019 on evaluation today patient actually appears to be doing quite well with regard to his left posterior lower extremity ulcer. He still has a very tiny opening remaining but the fortunate thing is he seems to be healing quite nicely. He also did get his Farrow wrap which I am hoping will help with his edema control as well at home. Fortunately there is no evidence of active infection. 02/12/2019 patient and fortunately appears to be doing poorly in regard to his wounds of the left lower extremity. He was very close to healing therefore we attempted to use his Velcro  compression wraps continuing with lymphedema pumps at home. Unfortunately that does not seem to have done very well for him. He tells me that he wore them all the time but again I am not sure why if that is the case that he is having such significant edema. He is still on his fluid pills as well. With that being said there is no obvious sign of infection although I do wonder about the possibility of infection at this time as well. 02/19/2019 unfortunately upon evaluation today patient appears to be doing more poorly with regard to his left lower extremity. He is not showing signs of significant improvement and I think the biggest issue here is that he does have an infection that appears to likely be Pseudomonas. That is based on the blue-green drainage that were noted at this time. Unfortunately the antibiotic that has been on is not going to take care of this at all. I think they will get a need to switch him to either Levaquin or Cipro and this was discussed with the patient. 02/26/2019 on evaluation today patient's lower extremity on the left appears to be doing significantly better as compared to last evaluation. Fortunately there is no signs of active infection at this time. He has been tolerating the compression wrap without complication in fact he made it the whole week at this point. He is showing signs of excellent improvement I am very happy in this regard. With that being said he is having some issues with infection we did review the results of his culture which I noted today. He did have a positive finding for Enterobacter as well as Alcaligenes faecalis. Fortunately the Levaquin that I placed him on will work for both which is great news. There is no signs of systemic infection at this point. 10/30; left posterior leg  wound in the setting of very significant edema and what looks like chronic venous inflammation. He has compression pumps but does not use them. We have been using 3 layer  compression. Silver alginate to the wound as the primary dressing 03/18/2019 on evaluation today patient appears to be doing a little better compared to last time I saw him. He really has not been using his compression pumps he tells me that he is having too much discomfort. He has been keeping his wraps on however. He is only been taking his fluid pills every other day because he states they are not really helping and he has an appointment with his primary care provider at the [facility] tomorrow. Subsequently the wound itself on the left lower extremity does seem to be greatly improved compared to previous. DANNON, NGUYENTHI ([id number]) 03/25/2019 on evaluation today patient appears to be doing better with regard to his wounds on the bilateral lower extremities. The left is doing excellent the right is also doing better although both still do show some signs of open wounds noted at this point unfortunately. Fortunately there is no signs of active infection at this time. The patient also is not really having any significant pain which is good news. Unfortunately there was some confusion with the referral on vascular disease and as far as getting the patient scheduled there can be contacting him later today to do this fortunately we got this straightened out. 04/01/2019 on evaluation today patient appears to be doing no fevers, chills, nausea, vomiting, or diarrhea. Excellent at this time with regard to his lower extremities. There does not appear to be any open wound at this point which is good news. Fortunately is also no signs of active infection at this time. Overall feel like the patient has done excellent with the compression the problem is every time we got him to this point and then subsequently go to using his own compression things just go right back to where they were. I am not sure how to address this we can try to get an appointment with vascular for 2 weeks now they have yet to call him. Obviously  this has become frustrating for the patient as well. I think the issue has just been an honest error as far as scheduling is concerned but nonetheless still worn out the point where I am unsure of which direction we should take. 04/08/2019 on evaluation today patient actually appears to be doing well with regard to his lower extremities. There are no open wounds at this time and things seem to be managing quite nicely as far as the overall edema control is concerned. With that being said he does have his compression socks today for [place] to go ahead and reinitiate therapy in that manner at this point. He is going to be going for shoes to be measured on Wednesday and then coincidentally he will also be seeing vascular on Thursday. Overall I think this is good news and again I am hopeful that they will be able to do something for him to help prevent ongoing issues with edema control as well. No fevers, chills, nausea, vomiting, or diarrhea. 04/11/2019 on evaluation today patient actually appears to be doing poorly after just being discharged on Monday of this week. He had been experiencing issues with again blisters especially on the left lower extremity. With that being said he was completely healed and appeared to be doing great this past Monday. He then subsequently has new blisters that formed  before his appointment with vascular this morning. He was also measured for shoes in the interim. With that being said we may have figured out what exactly is going on and why he continues to have issues like what we are seeing at this point. He takes his compression stockings off at nighttime and then he ends up having to sleep in his chair for 5-6 hours a night. He sleeps with his feet down he cannot really get him up in the recliner and therefore he is sleeping and the worst possible his position with his feet on the floor for that majority of the time. Again as I explained to him that is about one third at  minimum at least one fourth of his day that he spending with his feet dangling down on the ground and the worst possible position they could be. I think this may be what is causing the issue. Subsequently I am leaning toward thinking that he may need a hospital bed in order to elevate his legs. We likely can have to coordinate this with his primary care provider at the [facility]. Readmission: 01/26/2021 this is a patient who presents for repeat evaluation here in the clinic although it is actually been couple of years since have seen him in fact it was December 2020 when I last saw him. Subsequently he never really healed but did end up being lost to follow-up. He tells me has been having issues ongoing with his lower extremities has bilateral lower extremity lymphedema no real significant or definitive open wounds but in general his lymphedema is way out of control. We were never able to refer him to lymphedema clinic simply due to the fact to be honest we were never able to get him completely healed. I do not see anyone with open wounds. The patient does have evidence of type 2 diabetes mellitus, lymphedema, chronic venous insufficiency, and hypertension. That really has not changed since his last evaluation. 02/09/2021 upon evaluation today patient appears to be doing a little better in regard to his legs although he still having a tremendous amount of drainage especially on the left leg. Fortunately there does not appear to be any evidence of active infection. Of note when we looked into this further it appears that the patient did not have any absorptive dressing on it was just the 4-layer compression wrap. Nonetheless this is probably big part of the issue here. 10/10; he comes in today with 3 large areas on the upper right lower leg likely remanence of denuded blistering under his compression wraps. He has no other wounds on the right. On the left he has the denuded area on the left medial  foot and ankle and on the left dorsal foot. Massive lymphedema in both feet dorsally. Using Zetuvit under compression We have increased home health visitation to twice a week to change the dressings and will change it once 02/22/2021 upon evaluation today patient appears to be doing well currently with regard to his wounds. He has been tolerating the dressing changes without complication. Fortunately there does not appear to be any evidence of active infection which is great news. No fevers, chills, nausea, vomiting, or diarrhea. The biggest issue I see currently is that home health is not putting any medicine on the actual wounds before wrapping. 03/01/2021 upon evaluation today the patient's right leg actually appears to be doing quite well which is great news there does not appear to be any evidence of active infection at  this time. No fevers, chills, nausea, vomiting, or diarrhea. With that being said the patient is having issues on the left foot where he is having significant drainage is also an ammonia smell he does not have any animals at home and this makes me concerned about a bacteria producing urea as a byproduct. Again the possible common organisms will be E. coli, Proteus, and Enterococcus. All 3 of which can be successfully treated with Levaquin. For that reason I think that this may be a good option for [place] to consider placing him on and I did obtain a culture as well for confirmation sake. 03/08/2021 upon evaluation today patient appears to be doing unfortunately still somewhat poorly in regard to his leg ulcerations. He actually has an area on the right leg where he blistered due to the fact that his wrap slid down and caused an area of pinching on his skin and this has led to a significant issue here. 03/15/2021 upon evaluation today patient unfortunately has not been wrapped appropriately with absorptive dressings nor with the appropriate technique for the third layer of the 4-layer  compression wrap. These are issues that we continue to try to address with the home health nurse. Also the absorptive dressing that she had was cut in half and therefore that causes things to leak out it does not actually trap the fluid in regard to the top of the foot overall I think that all these combined are really not seeing things improved significantly here. Fortunately there does not appear to be any signs of significant infection at this time which is good news. He still is having a tremendous amount of drainage. 03/22/2021 upon evaluation today patient appears to be draining tremendously. He still continues to tell me that he is using his pumps 2 times a day and that coupled with that tells me that he is elevating his legs as well. With that being said all things considered I am really just not seeing the improvement we would expect to see with the 4-layer compression wrap and all the above noted. He in fact had an extremely large Zetuvit dressing on both legs and that they were extremely filled to the max with fluid. This is after just being changed just before the weekend and this MCKAY, TEGTMEYER ([id number]) is Monday. Nonetheless I am concerned about the fact that there is something going on fairly significant that we cannot get any of this under control and that he is draining this significantly. He supposed be having an echocardiogram it sounds like scheduling has been an issue for him as far as getting in sooner. Its something to do with needing his cousin to drive him because of where it sat and he cannot drive himself to this appointment either way I really think he needs to try to see what he can do about making this happen a little sooner. He tells me he will call today. 03/29/2021 on evaluation today patient appears to be doing about the same in regard to his legs. He did get his cardiology appointment moved up to 6 December which is at least good that is better than what it was  before mid December. Overall very pleased in that regard. 04/05/2021 upon evaluation today patient unfortunately is still doing fairly poorly. There does not appear to be any signs of active infection at this time. No fevers, chills, nausea, vomiting, or diarrhea. Unfortunately I think until his edema is under control and overall fluid overload there is really  not to be much chance that I can do much to get him better. This is quite unfortunate and frustrating both for myself and the patient to be perfectly honest. Nonetheless I think that he really needs to have a conversation both with his primary care provider as well as cardiologist he sees the PCP on Monday and cardiology on Wednesday of next week. 04/13/2021 upon evaluation today patient appears to be doing poorly in regard to his bilateral lower extremities his left is still worse than the right. With that being said he has a tremendous amount of drainage he did see his primary care provider yesterday there really was not much there to be done from their perspective. He sees cardiology tomorrow. Nonetheless my biggest concern here is simply that if we do not get the edema under control he is going to continue to have drainage and honestly I think at some point he is going to become infected severely that is my main concern. Objective Constitutional Obese and well-hydrated in no acute distress. Vitals Time Taken: 10:57 AM, Height: 73 in, Weight: 312 lbs, BMI: 41.2, Temperature: 98 F, Pulse: 55 bpm, Respiratory Rate: 18 breaths/min, Blood Pressure: 146/66 mmHg. Respiratory normal breathing without difficulty. Psychiatric this patient is able to make decisions and demonstrates good insight into disease process. Alert and Oriented x 3. pleasant and cooperative. General Notes: Upon inspection patient unfortunately continues to have significant issues with drainage from both legs left greater than right. This is to the degree that I am not  even certain we will get a be able to manage this on an outpatient basis. I will to see what cardiology recommends tomorrow. However if that is not something that they can do or figure out to effectively reduce his edema I think were probably can have to proceed with ER evaluation for potential hospital admission to get this under control. Integumentary (Hair, Skin) Wound #15 status is Open. Original cause of wound was Blister. The date acquired was: 03/08/2021. The wound has been in treatment 5 weeks. The wound is located on the Left,Medial Lower Leg. The wound measures 16cm length x 27cm width x 0.1cm depth; 339.292cm^2 area and 33.929cm^3 volume. There is Fat Layer (Subcutaneous Tissue) exposed. There is a large amount of serous drainage noted. There is medium (34- 66%) pink granulation within the wound bed. There is a medium (34-66%) amount of necrotic tissue within the wound bed including Adherent Slough. Assessment Active Problems ICD-10 Type 2 diabetes mellitus with other skin ulcer Lymphedema, not elsewhere classified Venous insufficiency (chronic) (peripheral) Non-pressure chronic ulcer of other part of left lower leg with fat layer exposed Non-pressure chronic ulcer of other part of right foot with fat layer exposed Essential (primary) hypertension Non-pressure chronic ulcer of other part of right lower leg limited to breakdown of skin [place], [name] ([id number]) Procedures Wound #15 Pre-procedure diagnosis of Wound #15 is a Lymphedema located on the Left,Medial Lower Leg . There was a Three Layer Compression Therapy Procedure by [name], RN. Post procedure Diagnosis Wound #15: Same as Pre-Procedure There was a Three Layer Compression Therapy Procedure by [name], RN. Post procedure Diagnosis Wound #: Same as Pre-Procedure Plan Follow-up Appointments: Return Appointment in 1 week. Home Health: [facility] for wound care. May utilize formulary equivalent  dressing for wound treatment orders unless otherwise specified. Home Health Nurse may visit PRN to address patient s wound care needs. - frequency 3 times per week - patient will be seen once at wound  center - home health to see patient 2 times per week on Wed and friday Bathing/ Shower/ Hygiene: May shower with wound dressing protected with water repellent cover or cast protector. Edema Control - Lymphedema / Segmental Compressive Device / Other: Optional: One layer of unna paste to top of compression wrap (to act as an anchor). - Unna paste on feet and calf to secure wrap in place 4 Layer Compression System Lymphedema. - bi lat, apply zetuvits/ or formulary for extra absorbant dressing around ankles and foot area, 3 times per week COTTON LAYER SPIRAL, LIGHT TAN SPIRAL. WHITE WITH YELLOW LINE FIGURE 8 , COBAN SPIRAL Elevate, Exercise Daily and Avoid Standing for Long Periods of Time. Elevate legs to the level of the heart and pump ankles as often as possible Elevate leg(s) parallel to the floor when sitting. Compression Pump: Use compression pump on left lower extremity for 60 minutes, twice daily. - Start today Compression Pump: Use compression pump on right lower extremity for 60 minutes, twice daily. - Start today DO YOUR BEST to sleep in the bed at night. DO NOT sleep in your recliner. Long hours of sitting in a recliner leads to swelling of the legs and/or potential wounds on your backside. Other: - Contact prescriber regarding use of diuretics to reduce fluid overload. Off-Loading: Open toe surgical shoe WOUND #15: - Lower Leg Wound Laterality: Left, Medial Secondary Dressing: Zetuvit Plus Silicone Non-bordered 5x5 (in/in) 3 x Per Week/30 Days Discharge Instructions: please do not cut zetuvit as they are not made to be cut causes leak and release fluid collected Compression Wrap: Medichoice 4 layer Compression System, 35-40 mmHG (Generic) 3 x Per Week/30 Days Discharge Instructions:  Apply multi-layer wrap as directed. 1. I am good recommend that we going continue with the wound care measures as before and the patient is in agreement the plan this includes the use of the absorptive dressings followed by the 4-layer compression wrap. 2. He should continue to elevate his legs as much as possible to try to keep edema under control. 3. I am also can recommend that he continue to monitor for any signs of worsening or infection such as increased pain, redness going up his leg, nausea, vomiting, or diarrhea. We will see patient back for reevaluation in 1 week here in the clinic. If anything worsens or changes patient will contact our office for additional recommendations. Electronic Signature(s) Signed: 04/13/2021 4:19:04 PM By: Worthy Keeler PA-C Previous Signature: 04/13/2021 11:58:51 AM Version By: Worthy Keeler PA-C Entered By: Worthy Keeler on 04/13/2021 16:19:04 [name] ([id number]) -------------------------------------------------------------------------------- SuperBill Details Patient Name: [name] Date of Service: 04/13/2021 Medical Record Number: [id number] Patient Account Number: [id number] Date of Birth/Sex: 1951-01-09 (70 y.o. M) Treating RN: [name] Primary Care Provider: Felipa Eth Other Clinician: Referring Provider: Felipa Eth Treating Provider/Extender: Skipper Cliche in Treatment: 11 Diagnosis Coding ICD-10 Codes Code Description E11.622 Type 2 diabetes mellitus with other skin ulcer I89.0 Lymphedema, not elsewhere classified I87.2 Venous insufficiency (chronic) (peripheral) L97.822 Non-pressure chronic ulcer of other part of left lower leg with fat layer exposed L97.512 Non-pressure chronic ulcer of other part of right foot with fat layer exposed I10 Essential (primary) hypertension L97.811 Non-pressure chronic ulcer of other part of right lower leg limited to breakdown of skin Facility Procedures CPT4: Description  Modifier Quantity Code [id number] BILATERAL: Application of multi-layer venous compression system; leg (below knee), including 1 ankle and foot. Physician Procedures CPT4 Code: 7209470 Description: 96283 - WC  PHYS LEVEL 4 - EST PT Modifier: Quantity: 1 CPT4 Code: Description: ICD-10 Diagnosis Description E11.622 Type 2 diabetes mellitus with other skin ulcer I89.0 Lymphedema, not elsewhere classified I87.2 Venous insufficiency (chronic) (peripheral) L97.822 Non-pressure chronic ulcer of other part of left lower  leg with fat layer Modifier: exposed Quantity: Electronic Signature(s) Signed: 04/13/2021 11:59:05 AM By: Worthy Keeler PA-C Previous Signature: 04/13/2021 11:55:21 AM Version By: [name] RN Entered By: Worthy Keeler on 04/13/2021 11:59:05

## 2021-04-13 NOTE — Progress Notes (Signed)
ZIAH, LEANDRO ([id number]) Visit Report for 04/13/2021 Arrival Information Details Patient Name: [name], [name] Date of Service: 04/13/2021 11:00 AM Medical Record Number: [id number] Patient Account Number: [id number] Date of Birth/Sex: 1950/12/09 (70 y.o. M) Treating RN: [name] Primary Care Cythia Bachtel: Felipa Eth Other Clinician: Referring Susan Bleich: Felipa Eth Treating Jace Fermin/Extender: Skipper Cliche in Treatment: 11 Visit Information History Since Last Visit All ordered tests and consults were completed: No Patient Arrived: Ambulatory Added or deleted any medications: No Arrival Time: 10:52 Any new allergies or adverse reactions: No Accompanied By: self Had a fall or experienced change in No Transfer Assistance: None activities of daily living that may affect Patient Identification Verified: Yes risk of falls: Secondary Verification Process Completed: Yes Signs or symptoms of abuse/neglect since last visito No Patient Requires Transmission-Based Precautions: No Hospitalized since last visit: No Patient Has Alerts: No Implantable device outside of the clinic excluding No cellular tissue based products placed in the center since last visit: Has Dressing in Place as Prescribed: Yes Has Compression in Place as Prescribed: Yes Pain Present Now: No Electronic Signature(s) Signed: 04/13/2021 3:44:10 PM By: [name] RN Entered By: [name] on 04/13/2021 10:57:16 [name] ([id number]) -------------------------------------------------------------------------------- Clinic Level of Care Assessment Details Patient Name: [name] Date of Service: 04/13/2021 11:00 AM Medical Record Number: [id number] Patient Account Number: [id number] Date of Birth/Sex: [birth year] (70 y.o. M) Treating RN: [name] Primary Care Lun Muro: Felipa Eth Other Clinician: Referring Yudith Norlander: Felipa Eth Treating Pascal Stiggers/Extender: Skipper Cliche in Treatment:  11 Clinic Level of Care Assessment Items TOOL 1 Quantity Score []  - Use when EandM and Procedure is performed on INITIAL visit 0 ASSESSMENTS - Nursing Assessment / Reassessment []  - General Physical Exam (combine w/ comprehensive assessment (listed just below) when performed on new 0 pt. evals) []  - 0 Comprehensive Assessment (HX, ROS, Risk Assessments, Wounds Hx, etc.) ASSESSMENTS - Wound and Skin Assessment / Reassessment []  - Dermatologic / Skin Assessment (not related to wound area) 0 ASSESSMENTS - Ostomy and/or Continence Assessment and Care []  - Incontinence Assessment and Management 0 []  - 0 Ostomy Care Assessment and Management (repouching, etc.) PROCESS - Coordination of Care []  - Simple Patient / Family Education for ongoing care 0 []  - 0 Complex (extensive) Patient / Family Education for ongoing care []  - 0 Staff obtains Programmer, systems, Records, Test Results / Process Orders []  - 0 Staff telephones HHA, Nursing Homes / Clarify orders / etc []  - 0 Routine Transfer to another Facility (non-emergent condition) []  - 0 Routine Hospital Admission (non-emergent condition) []  - 0 New Admissions / Biomedical engineer / Ordering NPWT, Apligraf, etc. []  - 0 Emergency Hospital Admission (emergent condition) PROCESS - Special Needs []  - Pediatric / Minor Patient Management 0 []  - 0 Isolation Patient Management []  - 0 Hearing / Language / Visual special needs []  - 0 Assessment of Community assistance (transportation, D/C planning, etc.) []  - 0 Additional assistance / Altered mentation []  - 0 Support Surface(s) Assessment (bed, cushion, seat, etc.) INTERVENTIONS - Miscellaneous []  - External ear exam 0 []  - 0 Patient Transfer (multiple staff / Civil Service fast streamer / Similar devices) []  - 0 Simple Staple / Suture removal (25 or less) []  - 0 Complex Staple / Suture removal (26 or more) []  - 0 Hypo/Hyperglycemic Management (do not check if billed separately) []  - 0 Ankle /  Brachial Index (ABI) - do not check if billed separately Has the patient been seen at the hospital within the last three years: Yes Total  Score: 0 Level Of Care: ____ [name] ([id number]) Electronic Signature(s) Signed: 04/13/2021 3:44:10 PM By: [name] RN Entered By: [name] on 04/13/2021 11:54:34 [name] ([id number]) -------------------------------------------------------------------------------- Compression Therapy Details Patient Name: [name] Date of Service: 04/13/2021 11:00 AM Medical Record Number: [id number] Patient Account Number: [id number] Date of Birth/Sex: [birth year] (70 y.o. M) Treating RN: [name] Primary Care Yazen Rosko: Felipa Eth Other Clinician: Referring Lyndol Vanderheiden: Felipa Eth Treating Jennika Ringgold/Extender: Skipper Cliche in Treatment: 11 Compression Therapy Performed for Wound Assessment: Wound #15 Left,Medial Lower Leg Performed By: Clinician [name], RN Compression Type: Three Layer Post Procedure Diagnosis Same as Pre-procedure Electronic Signature(s) Signed: 04/13/2021 11:52:51 AM By: [name] RN Entered By: [name] on 04/13/2021 11:52:51 [name] ([id number]) -------------------------------------------------------------------------------- Compression Therapy Details Patient Name: [name] Date of Service: 04/13/2021 11:00 AM Medical Record Number: [id number] Patient Account Number: [id number] Date of Birth/Sex: 12/30/1950 (70 y.o. M) Treating RN: [name] Primary Care Airam Heidecker: Felipa Eth Other Clinician: Referring Fady Stamps: Felipa Eth Treating Nitin Mckowen/Extender: Skipper Cliche in Treatment: 11 Compression Therapy Performed for Wound Assessment: NonWound Condition Lymphedema - Right Leg Performed By: Clinician [name], RN Compression Type: Three Layer Post Procedure Diagnosis Same as Pre-procedure Electronic Signature(s) Signed: 04/13/2021 11:53:09 AM By: [name]  RN Entered By: [name] on 04/13/2021 11:53:08 [name] ([id number]) -------------------------------------------------------------------------------- Encounter Discharge Information Details Patient Name: [name] Date of Service: 04/13/2021 11:00 AM Medical Record Number: [id number] Patient Account Number: [id number] Date of Birth/Sex: 01/05/1951 (70 y.o. M) Treating RN: [name] Primary Care Lasheba Stevens: Felipa Eth Other Clinician: Referring Ashlon Lottman: Felipa Eth Treating Amirah Goerke/Extender: Skipper Cliche in Treatment: 11 Encounter Discharge Information Items Discharge Condition: Stable Ambulatory Status: Walker Discharge Destination: Home Transportation: Private Auto Accompanied By: self Schedule Follow-up Appointment: Yes Clinical Summary of Care: Patient Declined Electronic Signature(s) Signed: 04/13/2021 11:56:16 AM By: [name] RN Entered By: [name] on 04/13/2021 11:56:16 [name] ([id number]) -------------------------------------------------------------------------------- Lower Extremity Assessment Details Patient Name: [name] Date of Service: 04/13/2021 11:00 AM Medical Record Number: [id number] Patient Account Number: [id number] Date of Birth/Sex: 1950-09-18 (70 y.o. M) Treating RN: [name] Primary Care Lekendrick Alpern: Felipa Eth Other Clinician: Referring Ciria Bernardini: Felipa Eth Treating Jarman Litton/Extender: Jeri Cos Weeks in Treatment: 11 Edema Assessment Assessed: [Left: No] [Right: No] Edema: [Left: Yes] [Right: Yes] Calf Left: Right: Point of Measurement: 35 cm From Medial Instep 44 cm 42 cm Ankle Left: Right: Point of Measurement: 10 cm From Medial Instep 47 cm 36 cm Electronic Signature(s) Signed: 04/13/2021 11:52:17 AM By: [name] RN Entered By: [name] on 04/13/2021 11:52:17 [name] ([id number]) -------------------------------------------------------------------------------- Multi  Wound Chart Details Patient Name: [name] Date of Service: 04/13/2021 11:00 AM Medical Record Number: [id number] Patient Account Number: [id number] Date of Birth/Sex: 01/25/1951 (70 y.o. M) Treating RN: [name] Primary Care Gradie Ohm: Felipa Eth Other Clinician: Referring Paulena Servais: Felipa Eth Treating Danajah Birdsell/Extender: Skipper Cliche in Treatment: 11 Vital Signs Height(in): 73 Pulse(bpm): 64 Weight(lbs): 312 Blood Pressure(mmHg): 146/66 Body Mass Index(BMI): 41 Temperature(F): 98 Respiratory Rate(breaths/min): 18 Photos: [15:No Photos] [N/A:N/A] Wound Location: [15:Left, Medial Lower Leg] [N/A:N/A] Wounding Event: [15:Blister] [N/A:N/A] Primary Etiology: [15:Lymphedema] [N/A:N/A] Comorbid History: [15:Hypertension, Peripheral Venous Disease, Type II Diabetes, Neuropathy, Received Chemotherapy] [N/A:N/A] Date Acquired: [15:03/08/2021] [N/A:N/A] Weeks of Treatment: [15:5] [N/A:N/A] Wound Status: [15:Open] [N/A:N/A] Measurements L x W x D (cm) [15:16x27x0.1] [N/A:N/A] Area (cm) : [72:620.355] [N/A:N/A] Volume (cm) : [97:41.638] [N/A:N/A] % Reduction in Area: [15:-4220.00%] [N/A:N/A] % Reduction in Volume: [15:-764.00%] [N/A:N/A] Classification: [15:Full Thickness Without  Exposed Support Structures] [N/A:N/A] Exudate Amount: [15:Large] [N/A:N/A] Exudate Type: [15:Serous] [N/A:N/A] Exudate Color: [15:amber] [N/A:N/A] Granulation Amount: [15:Medium (34-66%)] [N/A:N/A] Granulation Quality: [15:Pink] [N/A:N/A] Necrotic Amount: [15:Medium (34-66%)] [N/A:N/A] Exposed Structures: [15:Fat Layer (Subcutaneous Tissue): Yes Fascia: No Tendon: No Muscle: No Joint: No Bone: No None] [N/A:N/A N/A] Treatment Notes Electronic Signature(s) Signed: 04/13/2021 11:52:34 AM By: [name] RN Entered By: [name] on 04/13/2021 11:52:34 [name] ([id number]) -------------------------------------------------------------------------------- [place] Details Patient Name: [name] Date of Service: 04/13/2021 11:00 AM Medical Record Number: [id number] Patient Account Number: [id number] Date of Birth/Sex: 03/28/1951 (70 y.o. M) Treating RN: [name] Primary Care Emmerson Shuffield: Felipa Eth Other Clinician: Referring Dejuana Weist: Felipa Eth Treating Eymi Lipuma/Extender: Skipper Cliche in Treatment: 11 Active Inactive Wound/Skin Impairment Nursing Diagnoses: Knowledge deficit related to ulceration/compromised skin integrity Goals: Patient/caregiver will verbalize understanding of skin care regimen Date Initiated: 01/26/2021 Target Resolution Date: 03/28/2021 Goal Status: Active Interventions: Assess patient/caregiver ability to obtain necessary supplies Assess patient/caregiver ability to perform ulcer/skin care regimen upon admission and as needed Assess ulceration(s) every visit Notes: Electronic Signature(s) Signed: 04/13/2021 11:52:25 AM By: [name] RN Entered By: [name] on 04/13/2021 11:52:25 [name] ([id number]) -------------------------------------------------------------------------------- Pain Assessment Details Patient Name: [name] Date of Service: 04/13/2021 11:00 AM Medical Record Number: [id number] Patient Account Number: [id number] Date of Birth/Sex: Sep 17, 1950 (70 y.o. M) Treating RN: [name] Primary Care Labrian Torregrossa: Felipa Eth Other Clinician: Referring Marshal Schrecengost: Felipa Eth Treating Makailey Hodgkin/Extender: Skipper Cliche in Treatment: 11 Active Problems Location of Pain Severity and Description of Pain Patient Has Paino No Site Locations Pain Management and Medication Current Pain Management: Electronic Signature(s) Signed: 04/13/2021 3:44:10 PM By: [name] RN Entered By: [name] on 04/13/2021 10:58:04 [name] ([id number]) -------------------------------------------------------------------------------- Patient/Caregiver Education  Details Patient Name: [name] Date of Service: 04/13/2021 11:00 AM Medical Record Number: [id number] Patient Account Number: [id number] Date of Birth/Gender: [birth year] (70 y.o. M) Treating RN: [name] Primary Care Physician: Felipa Eth Other Clinician: Referring Physician: Felipa Eth Treating Physician/Extender: Skipper Cliche in Treatment: 11 Education Assessment Education Provided To: Patient Education Topics Provided Wound/Skin Impairment: Methods: Explain/Verbal Responses: State content correctly Electronic Signature(s) Signed: 04/13/2021 3:44:10 PM By: [name] RN Entered By: [name] on 04/13/2021 11:55:31 [name] ([id number]) -------------------------------------------------------------------------------- Wound Assessment Details Patient Name: [name] Date of Service: 04/13/2021 11:00 AM Medical Record Number: [id number] Patient Account Number: [id number] Date of Birth/Sex: 1950/05/28 (70 y.o. M) Treating RN: [name] Primary Care Singleton Hickox: Felipa Eth Other Clinician: Referring Derrious Bologna: Felipa Eth Treating Jaydian Santana/Extender: Skipper Cliche in Treatment: 11 Wound Status Wound Number: 15 Primary Lymphedema Etiology: Wound Location: Left, Medial Lower Leg Wound Open Wounding Event: Blister Status: Date Acquired: 03/08/2021 Comorbid Hypertension, Peripheral Venous Disease, Type II Weeks Of Treatment: 5 History: Diabetes, Neuropathy, Received Chemotherapy Clustered Wound: No Wound Measurements Length: (cm) 16 Width: (cm) 27 Depth: (cm) 0.1 Area: (cm) 339.292 Volume: (cm) 33.929 % Reduction in Area: -4220% % Reduction in Volume: -764% Epithelialization: None Wound Description Classification: Full Thickness Without Exposed Support Structures Exudate Amount: Large Exudate Type: Serous Exudate Color: amber Foul Odor After Cleansing: No Slough/Fibrino Yes Wound Bed Granulation Amount: Medium  (34-66%) Exposed Structure Granulation Quality: Pink Fascia Exposed: No Necrotic Amount: Medium (34-66%) Fat Layer (Subcutaneous Tissue) Exposed: Yes Necrotic Quality: Adherent Slough Tendon Exposed: No Muscle Exposed: No Joint Exposed: No Bone Exposed: No Treatment Notes Wound #15 (Lower Leg) Wound Laterality: Left, Medial Cleanser Peri-Wound Care Topical Primary Dressing Secondary Dressing Zetuvit Plus Silicone Non-bordered  5x5 (in/in) Discharge Instruction: please do not cut zetuvit as they are not made to be cut causes leak and release fluid collected Secured With Compression Wrap Medichoice 4 layer Compression System, 35-40 mmHG Discharge Instruction: Apply multi-layer wrap as directed. Compression Stockings Add-Ons ENMANUEL, ZUFALL ([id number]) Electronic Signature(s) Signed: 04/13/2021 11:51:52 AM By: [name] RN Entered By: [name] on 04/13/2021 11:51:51 [name] ([id number]) -------------------------------------------------------------------------------- Vitals Details Patient Name: [name] Date of Service: 04/13/2021 11:00 AM Medical Record Number: [id number] Patient Account Number: [id number] Date of Birth/Sex: 09-02-1950 (70 y.o. M) Treating RN: [name] Primary Care Raydel Hosick: Felipa Eth Other Clinician: Referring Taft Worthing: Felipa Eth Treating Shailynn Fong/Extender: Skipper Cliche in Treatment: 11 Vital Signs Time Taken: 10:57 Temperature (F): 98 Height (in): 73 Pulse (bpm): 55 Weight (lbs): 312 Respiratory Rate (breaths/min): 18 Body Mass Index (BMI): 41.2 Blood Pressure (mmHg): 146/66 Reference Range: 80 - 120 mg / dl Electronic Signature(s) Signed: 04/13/2021 3:44:10 PM By: [name] RN Entered By: [name] on 04/13/2021 10:57:37

## 2021-04-14 ENCOUNTER — Ambulatory Visit (INDEPENDENT_AMBULATORY_CARE_PROVIDER_SITE_OTHER): Payer: PPO

## 2021-04-14 DIAGNOSIS — R600 Localized edema: Secondary | ICD-10-CM | POA: Diagnosis not present

## 2021-04-14 LAB — ECHOCARDIOGRAM COMPLETE
AR max vel: 3.22 cm2
AV Area VTI: 3.39 cm2
AV Area mean vel: 3.29 cm2
AV Mean grad: 6 mmHg
AV Peak grad: 12.7 mmHg
Ao pk vel: 1.78 m/s
Area-P 1/2: 4.89 cm2
S' Lateral: 4 cm

## 2021-04-14 MED ORDER — PERFLUTREN LIPID MICROSPHERE
1.0000 mL | INTRAVENOUS | Status: AC | PRN
Start: 2021-04-14 — End: 2021-04-14
  Administered 2021-04-14: 2 mL via INTRAVENOUS

## 2021-04-14 NOTE — Progress Notes (Unsigned)
IV  

## 2021-04-14 NOTE — Progress Notes (Signed)
LARSEN, DUNGAN ([id number]) Visit Report for 03/29/2021 Arrival Information Details Patient Name: [name], [name] Date of Service: 03/29/2021 10:15 AM Medical Record Number: [id number] Patient Account Number: [id number] Date of Birth/Sex: 19-May-1950 (70 y.o. M) Treating RN: [name] Primary Care Ciena Sampley: Felipa Eth Other Clinician: Referring Skyanne Welle: Felipa Eth Treating Kalicia Dufresne/Extender: Skipper Cliche in Treatment: 8 Visit Information History Since Last Visit All ordered tests and consults were completed: No Patient Arrived: Ambulatory Added or deleted any medications: No Arrival Time: 10:04 Any new allergies or adverse reactions: No Accompanied By: self Had a fall or experienced change in No Transfer Assistance: None activities of daily living that may affect Patient Identification Verified: Yes risk of falls: Secondary Verification Process Completed: Yes Signs or symptoms of abuse/neglect since last visito No Patient Requires Transmission-Based Precautions: No Hospitalized since last visit: No Patient Has Alerts: No Implantable device outside of the clinic excluding No cellular tissue based products placed in the center since last visit: Has Dressing in Place as Prescribed: Yes Has Compression in Place as Prescribed: Yes Pain Present Now: No Electronic Signature(s) Signed: 04/14/2021 1:44:34 PM By: [name] RN Entered By: [name] on 03/29/2021 10:08:21 [name] ([id number]) -------------------------------------------------------------------------------- Clinic Level of Care Assessment Details Patient Name: [name] Date of Service: 03/29/2021 10:15 AM Medical Record Number: [id number] Patient Account Number: [id number] Date of Birth/Sex: 1951-02-15 (70 y.o. M) Treating RN: [name] Primary Care [place] Cantero: Felipa Eth Other Clinician: Referring Jayro Mcmath: Felipa Eth Treating Alayne Estrella/Extender: Skipper Cliche in  Treatment: 8 Clinic Level of Care Assessment Items TOOL 1 Quantity Score []  - Use when EandM and Procedure is performed on INITIAL visit 0 ASSESSMENTS - Nursing Assessment / Reassessment []  - General Physical Exam (combine w/ comprehensive assessment (listed just below) when performed on new 0 pt. evals) []  - 0 Comprehensive Assessment (HX, ROS, Risk Assessments, Wounds Hx, etc.) ASSESSMENTS - Wound and Skin Assessment / Reassessment []  - Dermatologic / Skin Assessment (not related to wound area) 0 ASSESSMENTS - Ostomy and/or Continence Assessment and Care []  - Incontinence Assessment and Management 0 []  - 0 Ostomy Care Assessment and Management (repouching, etc.) PROCESS - Coordination of Care []  - Simple Patient / Family Education for ongoing care 0 []  - 0 Complex (extensive) Patient / Family Education for ongoing care []  - 0 Staff obtains Programmer, systems, Records, Test Results / Process Orders []  - 0 Staff telephones HHA, Nursing Homes / Clarify orders / etc []  - 0 Routine Transfer to another Facility (non-emergent condition) []  - 0 Routine Hospital Admission (non-emergent condition) []  - 0 New Admissions / Biomedical engineer / Ordering NPWT, Apligraf, etc. []  - 0 Emergency Hospital Admission (emergent condition) PROCESS - Special Needs []  - Pediatric / Minor Patient Management 0 []  - 0 Isolation Patient Management []  - 0 Hearing / Language / Visual special needs []  - 0 Assessment of Community assistance (transportation, D/C planning, etc.) []  - 0 Additional assistance / Altered mentation []  - 0 Support Surface(s) Assessment (bed, cushion, seat, etc.) INTERVENTIONS - Miscellaneous []  - External ear exam 0 []  - 0 Patient Transfer (multiple staff / Civil Service fast streamer / Similar devices) []  - 0 Simple Staple / Suture removal (25 or less) []  - 0 Complex Staple / Suture removal (26 or more) []  - 0 Hypo/Hyperglycemic Management (do not check if billed separately) []  -  0 Ankle / Brachial Index (ABI) - do not check if billed separately Has the patient been seen at the hospital within the last three years: Yes Total  Score: 0 Level Of Care: ____ [name] ([id number]) Electronic Signature(s) Signed: 04/14/2021 1:44:34 PM By: [name] RN Entered By: [name] on 03/29/2021 11:34:11 [name] ([id number]) -------------------------------------------------------------------------------- Compression Therapy Details Patient Name: [name] Date of Service: 03/29/2021 10:15 AM Medical Record Number: [id number] Patient Account Number: [id number] Date of Birth/Sex: 12-Aug-1950 (69 y.o. M) Treating RN: [name] Primary Care Austine Kelsay: Felipa Eth Other Clinician: Referring Braylynn Lewing: Felipa Eth Treating Necha Harries/Extender: Skipper Cliche in Treatment: 8 Compression Therapy Performed for Wound Assessment: Wound #15 Left,Medial Lower Leg Performed By: Clinician [name], RN Compression Type: Four Layer Post Procedure Diagnosis Same as Pre-procedure Electronic Signature(s) Signed: 04/14/2021 1:44:34 PM By: [name] RN Entered By: [name] on 03/29/2021 10:42:03 [name] ([id number]) -------------------------------------------------------------------------------- Compression Therapy Details Patient Name: [name] Date of Service: 03/29/2021 10:15 AM Medical Record Number: [id number] Patient Account Number: [id number] Date of Birth/Sex: 12/08/1950 (70 y.o. M) Treating RN: [name] Primary Care Inas Avena: Felipa Eth Other Clinician: Referring Jeoffrey Eleazer: Felipa Eth Treating Davonta Stroot/Extender: Skipper Cliche in Treatment: 8 Compression Therapy Performed for Wound Assessment: Wound #16 Right,Posterior Lower Leg Performed By: Clinician [name], RN Compression Type: Four Layer Post Procedure Diagnosis Same as Pre-procedure Electronic Signature(s) Signed: 04/14/2021 1:44:34 PM By: [name]  RN Entered By: [name] on 03/29/2021 10:42:26 [name] ([id number]) -------------------------------------------------------------------------------- Encounter Discharge Information Details Patient Name: [name] Date of Service: 03/29/2021 10:15 AM Medical Record Number: [id number] Patient Account Number: [id number] Date of Birth/Sex: 1950/05/15 (70 y.o. M) Treating RN: [name] Primary Care Tandre Conly: Felipa Eth Other Clinician: Referring Jisel Fleet: Felipa Eth Treating Molli Gethers/Extender: Skipper Cliche in Treatment: 8 Encounter Discharge Information Items Discharge Condition: Stable Ambulatory Status: Ambulatory Discharge Destination: Home Transportation: Private Auto Accompanied By: self Schedule Follow-up Appointment: Yes Clinical Summary of Care: Patient Declined Electronic Signature(s) Signed: 03/29/2021 11:35:29 AM By: [name] RN Entered By: [name] on 03/29/2021 11:35:29 [name] ([id number]) -------------------------------------------------------------------------------- Lower Extremity Assessment Details Patient Name: [name] Date of Service: 03/29/2021 10:15 AM Medical Record Number: [id number] Patient Account Number: [id number] Date of Birth/Sex: 28-Feb-1951 (70 y.o. M) Treating RN: [name] Primary Care Harlow Carrizales: Felipa Eth Other Clinician: Referring Jayzen Paver: Felipa Eth Treating Karelly Dewalt/Extender: Jeri Cos Weeks in Treatment: 8 Edema Assessment Assessed: [Left: No] [Right: No] Edema: [Left: Yes] [Right: Yes] Calf Left: Right: Point of Measurement: 35 cm From Medial Instep 44 cm 41.3 cm Ankle Left: Right: Point of Measurement: 10 cm From Medial Instep 46.2 cm 36.7 cm Electronic Signature(s) Signed: 04/14/2021 1:44:34 PM By: [name] RN Entered By: [name] on 03/29/2021 10:26:45 [name]  ([id number]) -------------------------------------------------------------------------------- Multi Wound Chart Details Patient Name: [name] Date of Service: 03/29/2021 10:15 AM Medical Record Number: [id number] Patient Account Number: [id number] Date of Birth/Sex: Oct 07, 1950 (70 y.o. 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 Comorbid History: Hypertension, Peripheral Venous Hypertension, Peripheral Venous N/A Disease, Type II Diabetes, Disease, Type II Diabetes, Neuropathy, Received Neuropathy, Received Chemotherapy Chemotherapy Date Acquired: 03/08/2021 03/26/2021 N/A Weeks of Treatment: 3 0 N/A Wound Status: Open Open N/A Measurements L x W x D (cm) 16x27x0.2 7x8x0.1 N/A Area (cm) : 561.537  43.982 N/A Volume (cm) : 67.858 4.398 N/A % Reduction in Area: -4220.00% N/A N/A % Reduction in Volume: -1628.00% N/A N/A Classification: Full Thickness Without Exposed Grade 2 N/A Support Structures Exudate Amount: Large Medium N/A Exudate Type: Serous Serosanguineous N/A Exudate Color: amber red, brown N/A Granulation Amount: Medium (34-66%) Large (67-100%) N/A Granulation Quality: Pink Red N/A Necrotic Amount: Medium (34-66%) None Present (0%) N/A Exposed Structures: Fat Layer (Subcutaneous Tissue): Fat Layer (Subcutaneous Tissue): N/A Yes Yes Fascia: No Fascia: No Tendon: No Tendon: No Muscle: No Muscle: No Joint: No Joint: No Bone: No Bone:  No Epithelialization: None None N/A Treatment Notes Electronic Signature(s) Signed: 04/14/2021 1:44:34 PM By: [name] RN Entered By: [name] on 03/29/2021 10:36:28 [name] ([id number]) -------------------------------------------------------------------------------- [place] Details Patient Name: [name] Date of Service: 03/29/2021 10:15 AM Medical Record Number: [id number] Patient Account Number: [id number] Date of Birth/Sex: November 20, 1950 (70 y.o. M) Treating RN: [name] Primary Care Leshaun Biebel: Felipa Eth Other Clinician: Referring Dorethy Tomey: Felipa Eth Treating Gretna Bergin/Extender: Skipper Cliche in Treatment: 8 Active Inactive Wound/Skin Impairment Nursing Diagnoses: Knowledge deficit related to ulceration/compromised skin integrity Goals: Patient/caregiver will verbalize understanding of skin care regimen Date Initiated: 01/26/2021 Target Resolution Date: 03/28/2021 Goal Status: Active Interventions: Assess patient/caregiver ability to obtain necessary supplies Assess patient/caregiver ability to perform ulcer/skin care regimen upon admission and as needed Assess ulceration(s) every visit Notes: Electronic Signature(s) Signed: 04/14/2021 1:44:34 PM By: [name] RN Entered By: [name] on 03/29/2021 10:35:38 [name] ([id number]) -------------------------------------------------------------------------------- Pain Assessment Details Patient Name: [name] Date of Service: 03/29/2021 10:15 AM Medical Record Number: [id number] Patient Account Number: [id number] Date of Birth/Sex: 1951-01-14 (70 y.o. M) Treating RN: [name] Primary Care Orella Cushman: Felipa Eth Other Clinician: Referring Admire Bunnell: Felipa Eth Treating Kynesha Guerin/Extender: Skipper Cliche in Treatment: 8 Active Problems Location of Pain Severity and Description of Pain Patient Has Paino No Site Locations Pain Management and  Medication Current Pain Management: Electronic Signature(s) Signed: 04/14/2021 1:44:34 PM By: [name] RN Entered By: [name] on 03/29/2021 10:09:48 [name] ([id number]) -------------------------------------------------------------------------------- Patient/Caregiver Education Details Patient Name: [name] Date of Service: 03/29/2021 10:15 AM Medical Record Number: [id number] Patient Account Number: [id number] Date of Birth/Gender: [birth year] (70 y.o. M) Treating RN: [name] Primary Care Physician: Felipa Eth Other Clinician: Referring Physician: Felipa Eth Treating Physician/Extender: Skipper Cliche in Treatment: 8 Education Assessment Education Provided To: Patient Education Topics Provided Wound/Skin Impairment: Methods: Explain/Verbal Responses: State content correctly Electronic Signature(s) Signed: 04/14/2021 1:44:34 PM By: [name] RN Entered By: [name] on 03/29/2021 11:34:37 [name] ([id number]) -------------------------------------------------------------------------------- Wound Assessment Details Patient Name: [name] Date of Service: 03/29/2021 10:15 AM Medical Record Number: [id number] Patient Account Number: [id number] Date of Birth/Sex: 04-24-1951 (70 y.o. M) Treating RN: [name] Primary Care Dakoda Laventure: Felipa Eth Other Clinician: Referring Keelin Neville: Felipa Eth Treating Raedyn Wenke/Extender: Jeri Cos Weeks in Treatment: 8 Wound Status Wound Number: 15 Primary Lymphedema Etiology: Wound Location: Left, Medial Lower Leg Wound Open Wounding Event: Blister Status: Date Acquired: 03/08/2021 Comorbid Hypertension, Peripheral Venous Disease, Type II Weeks Of Treatment: 3 History: Diabetes, Neuropathy, Received Chemotherapy Clustered Wound: No Photos Wound Measurements Length: (cm) 16 Width: (cm) 27 Depth: (cm) 0.2 Area: (cm) 339.292 Volume: (cm) 67.858 % Reduction in Area:  -4220% % Reduction in Volume: -1628% Epithelialization: None Tunneling: No Undermining: No Wound Description Classification: Full Thickness Without Exposed Support Structu Exudate Amount: Large Exudate Type: Serous Exudate Color: amber res Foul Odor After Cleansing: No Slough/Fibrino Yes Wound  Bed Granulation Amount: Medium (34-66%) Exposed Structure Granulation Quality: Pink Fascia Exposed: No Necrotic Amount: Medium (34-66%) Fat Layer (Subcutaneous Tissue) Exposed: Yes Necrotic Quality: Adherent Slough Tendon Exposed: No Muscle Exposed: No Joint Exposed: No Bone Exposed: No Electronic Signature(s) Signed: 04/14/2021 1:44:34 PM By: [name] RN Entered By: [name] on 03/29/2021 10:25:51 [name] ([id number]) -------------------------------------------------------------------------------- Wound Assessment Details Patient Name: [name] Date of Service: 03/29/2021 10:15 AM Medical Record Number: [id number] Patient Account Number: [id number] Date of Birth/Sex: October 24, 1950 (70 y.o. M) Treating RN: [name] Primary Care Lavenia Stumpo: Felipa Eth Other Clinician: Referring Miyeko Mahlum: Felipa Eth Treating Marykay Mccleod/Extender: Skipper Cliche in Treatment: 8 Wound Status Wound Number: 16 Primary Diabetic Wound/Ulcer of the Lower Extremity Etiology: Wound Location: Right, Posterior Lower Leg Wound Open Wounding Event: Blister Status: Date Acquired: 03/26/2021 Comorbid Hypertension, Peripheral Venous Disease, Type II Weeks Of Treatment: 0 History: Diabetes, Neuropathy, Received Chemotherapy Clustered Wound: No Photos Wound Measurements Length: (cm) 7 Width: (cm) 8 Depth: (cm) 0.1 Area: (cm) 43.982 Volume: (cm) 4.398 % Reduction in Area: % Reduction in Volume: Epithelialization: None Tunneling: No Undermining: No Wound Description Classification: Grade 2 Exudate Amount: Medium Exudate Type: Serosanguineous Exudate Color: red, brown Foul  Odor After Cleansing: No Slough/Fibrino Yes Wound Bed Granulation Amount: Large (67-100%) Exposed Structure Granulation Quality: Red Fascia Exposed: No Necrotic Amount: None Present (0%) Fat Layer (Subcutaneous Tissue) Exposed: Yes Tendon Exposed: No Muscle Exposed: No Joint Exposed: No Bone Exposed: No Electronic Signature(s) Signed: 04/14/2021 1:44:34 PM By: [name] RN Entered By: [name] on 03/29/2021 10:24:44 [name] ([id number]) -------------------------------------------------------------------------------- Vitals Details Patient Name: [name] Date of Service: 03/29/2021 10:15 AM Medical Record Number: [id number] Patient Account Number: [id number] Date of Birth/Sex: 01/26/1951 (70 y.o. M) Treating RN: [name] Primary Care Adonia Porada: Felipa Eth Other Clinician: Referring Ismahan Lippman: Felipa Eth Treating Jhoan Schmieder/Extender: Skipper Cliche in Treatment: 8 Vital Signs Time Taken: 10:08 Temperature (F): 97.9 Height (in): 73 Pulse (bpm): 81 Weight (lbs): 312 Respiratory Rate (breaths/min): 18 Body Mass Index (BMI): 41.2 Blood Pressure (mmHg): 144/83 Reference Range: 80 - 120 mg / dl Electronic Signature(s) Signed: 04/14/2021 1:44:34 PM By: [name] RN Entered By: [name] on 03/29/2021 10:08:47

## 2021-04-16 ENCOUNTER — Telehealth: Payer: Self-pay | Admitting: Cardiovascular Disease

## 2021-04-16 ENCOUNTER — Other Ambulatory Visit: Payer: Self-pay

## 2021-04-16 DIAGNOSIS — E11622 Type 2 diabetes mellitus with other skin ulcer: Secondary | ICD-10-CM | POA: Diagnosis not present

## 2021-04-16 NOTE — Progress Notes (Signed)
RESEAN, BRANDER ([id number]) Visit Report for 04/16/2021 Physician Orders Details Patient Name: [name], [name] Date of Service: 04/16/2021 12:30 PM Medical Record Number: [id number] Patient Account Number: [id number] Date of Birth/Sex: [birth year] (70 y.o. M) Treating RN: Donnamarie Poag Primary Care Provider: Felipa Eth Other Clinician: Referring Provider: Felipa Eth Treating Provider/Extender: Skipper Cliche in Treatment: 11 Verbal / Phone Orders: No Diagnosis Coding Follow-up Appointments o Return Appointment in 1 week. [place] for wound care. May utilize formulary equivalent dressing for wound treatment orders unless otherwise specified. Home Health Nurse may visit PRN to address patientos wound care needs. - frequency 3 times per week - patient will be seen once at wound center - home health to see patient 2 times per week on Wed and friday Bathing/ Shower/ Hygiene o May shower with wound dressing protected with water repellent cover or cast protector. Edema Control - Lymphedema / Segmental Compressive Device / Other Bilateral Lower Extremities o Optional: One layer of unna paste to top of compression wrap (to act as an anchor). - Unna paste on feet and calf to secure wrap in place o 4 Layer Compression System Lymphedema. - bi lat, apply zetuvits/ or formulary for extra absorbant dressing around ankles and foot area, 3 times per week COTTON LAYER SPIRAL, LIGHT TAN SPIRAL. WHITE WITH YELLOW LINE FIGURE 8 , COBAN SPIRAL o Elevate, Exercise Daily and Avoid Standing for Long Periods of Time. o Elevate legs to the level of the heart and pump ankles as often as possible o Elevate leg(s) parallel to the floor when sitting. o Compression Pump: Use compression pump on left lower extremity for 60 minutes, twice daily. - Start today o Compression Pump: Use compression pump on right lower extremity for 60 minutes, twice daily. - Start  today o DO YOUR BEST to sleep in the bed at night. DO NOT sleep in your recliner. Long hours of sitting in a recliner leads to swelling of the legs and/or potential wounds on your backside. o Other: - Contact prescriber regarding use of diuretics to reduce fluid overload. Off-Loading Bilateral Lower Extremities o Open toe surgical shoe Wound Treatment Wound #15 - Lower Leg Wound Laterality: Left, Medial Secondary Dressing: Zetuvit Plus Silicone Non-bordered 5x5 (in/in) 3 x Per Week/30 Days Discharge Instructions: please do not cut zetuvit as they are not made to be cut causes leak and release fluid collected Compression Wrap: Medichoice 4 layer Compression System, 35-40 mmHG (Generic) 3 x Per Week/30 Days Discharge Instructions: Apply multi-layer wrap as directed. Electronic Signature(s) Signed: 04/16/2021 1:51:06 PM By: Donnamarie Poag Signed: 04/16/2021 6:06:35 PM By: Worthy Keeler PA-C Entered By: Donnamarie Poag on 04/16/2021 12:56:58 [name] ([id number]) -------------------------------------------------------------------------------- [place] Details Patient Name: [name] Date of Service: 04/16/2021 Medical Record Number: [id number] Patient Account Number: [id number] Date of Birth/Sex: [birth year] (70 y.o. M) Treating RN: Donnamarie Poag Primary Care Provider: Felipa Eth Other Clinician: Referring Provider: Felipa Eth Treating Provider/Extender: Skipper Cliche in Treatment: 11 Diagnosis Coding ICD-10 Codes Code Description E11.622 Type 2 diabetes mellitus with other skin ulcer I89.0 Lymphedema, not elsewhere classified I87.2 Venous insufficiency (chronic) (peripheral) L97.822 Non-pressure chronic ulcer of other part of left lower leg with fat layer exposed L97.512 Non-pressure chronic ulcer of other part of right foot with fat layer exposed I10 Essential (primary) hypertension L97.811 Non-pressure chronic ulcer of other part of right lower leg limited to  breakdown of skin Facility Procedures CPT4: Description Modifier Quantity Code [id number] BILATERAL: Application of multi-layer venous  compression system; leg (below knee), including 1 ankle and foot. Electronic Signature(s) Signed: 04/16/2021 4:35:51 PM By: [name] RN Signed: 04/16/2021 6:06:35 PM By: Worthy Keeler PA-C Previous Signature: 04/16/2021 1:51:06 PM Version By: Donnamarie Poag Entered By: [name] on 04/16/2021 16:35:50

## 2021-04-16 NOTE — Telephone Encounter (Signed)
Was able to return call to [name] regarding ECHO results. Advised the radiologist has reviewed his ECHO and has sent it to Dr. Rockey Situ for review, at this time, Dr. Rockey Situ has not reviewed results d/t high hospital censes and high volume clinic days. [name] verbalized understanding.  Preliminary results reviewed by nurse. Forwarded to MD desktop for review and signature.  [name] thankful for the call and update, will await call back for final ECHO results.

## 2021-04-16 NOTE — Telephone Encounter (Signed)
Patient calling to check status of ECHO results

## 2021-04-16 NOTE — Progress Notes (Addendum)
[name] ([id number]) Visit Report for 04/16/2021 Arrival Information Details Patient Name: [name], [name] Date of Service: 04/16/2021 12:30 PM Medical Record Number: [id number] Patient Account Number: [id number] Date of Birth/Sex: 1950/06/26 (70 y.o. M) Treating RN: Donnamarie Poag Primary Care Kerby Borner: Felipa Eth Other Clinician: Referring Ikeem Cleckler: Felipa Eth Treating Nickolette Espinola/Extender: Skipper Cliche in Treatment: 11 Visit Information History Since Last Visit Added or deleted any medications: No Patient Arrived: [name] Had a fall or experienced change in No Arrival Time: 12:45 activities of daily living that may affect Accompanied By: self risk of falls: Transfer Assistance: None Hospitalized since last visit: No Patient Identification Verified: Yes Has Dressing in Place as Prescribed: Yes Secondary Verification Process Completed: Yes Has Compression in Place as Prescribed: Yes Patient Requires Transmission-Based Precautions: No Pain Present Now: No Patient Has Alerts: No Electronic Signature(s) Signed: 04/16/2021 1:51:06 PM By: Donnamarie Poag Entered By: Donnamarie Poag on 04/16/2021 12:45:42 [name] ([id number]) -------------------------------------------------------------------------------- Clinic Level of Care Assessment Details Patient Name: [name] Date of Service: 04/16/2021 12:30 PM Medical Record Number: [id number] Patient Account Number: [id number] Date of Birth/Sex: [birth year] (70 y.o. M) Treating RN: [name] Primary Care Grabiel Schmutz: Felipa Eth Other Clinician: Referring Charlsey Moragne: Felipa Eth Treating Mikhai Bienvenue/Extender: Skipper Cliche in Treatment: 11 Clinic Level of Care Assessment Items TOOL 1 Quantity Score []  - Use when EandM and Procedure is performed on INITIAL visit 0 ASSESSMENTS - Nursing Assessment / Reassessment []  - General Physical Exam (combine w/ comprehensive assessment (listed just below) when performed on  new 0 pt. evals) []  - 0 Comprehensive Assessment (HX, ROS, Risk Assessments, Wounds Hx, etc.) ASSESSMENTS - Wound and Skin Assessment / Reassessment []  - Dermatologic / Skin Assessment (not related to wound area) 0 ASSESSMENTS - Ostomy and/or Continence Assessment and Care []  - Incontinence Assessment and Management 0 []  - 0 Ostomy Care Assessment and Management (repouching, etc.) PROCESS - Coordination of Care []  - Simple Patient / Family Education for ongoing care 0 []  - 0 Complex (extensive) Patient / Family Education for ongoing care []  - 0 Staff obtains Programmer, systems, Records, Test Results / Process Orders []  - 0 Staff telephones HHA, Nursing Homes / Clarify orders / etc []  - 0 Routine Transfer to another Facility (non-emergent condition) []  - 0 Routine Hospital Admission (non-emergent condition) []  - 0 New Admissions / Biomedical engineer / Ordering NPWT, Apligraf, etc. []  - 0 Emergency Hospital Admission (emergent condition) PROCESS - Special Needs []  - Pediatric / Minor Patient Management 0 []  - 0 Isolation Patient Management []  - 0 Hearing / Language / Visual special needs []  - 0 Assessment of Community assistance (transportation, D/C planning, etc.) []  - 0 Additional assistance / Altered mentation []  - 0 Support Surface(s) Assessment (bed, cushion, seat, etc.) INTERVENTIONS - Miscellaneous []  - External ear exam 0 []  - 0 Patient Transfer (multiple staff / Civil Service fast streamer / Similar devices) []  - 0 Simple Staple / Suture removal (25 or less) []  - 0 Complex Staple / Suture removal (26 or more) []  - 0 Hypo/Hyperglycemic Management (do not check if billed separately) []  - 0 Ankle / Brachial Index (ABI) - do not check if billed separately Has the patient been seen at the hospital within the last three years: Yes Total Score: 0 Level Of Care: ____ [name] ([id number]) Electronic Signature(s) Unsigned Entered ByCarlene Coria on 04/16/2021  16:35:43 Signature(s): Date(s): [name] ([id number]) -------------------------------------------------------------------------------- Compression Therapy Details Patient Name: [name] Date of Service: 04/16/2021 12:30 PM Medical Record Number: [id number] Patient Account  Number: [id number] Date of Birth/Sex: May 09, 1951 (70 y.o. M) Treating RN: Donnamarie Poag Primary Care Jolin Benavides: Felipa Eth Other Clinician: Referring Dannika Hilgeman: Felipa Eth Treating Ramiya Delahunty/Extender: Skipper Cliche in Treatment: 11 Compression Therapy Performed for Wound Assessment: Wound #15 Left,Medial Lower Leg Performed By: Junius Argyle, RN Compression Type: Four Layer Electronic Signature(s) Signed: 04/16/2021 1:51:06 PM By: Donnamarie Poag Entered By: Donnamarie Poag on 04/16/2021 12:56:07 [name] ([id number]) -------------------------------------------------------------------------------- Compression Therapy Details Patient Name: [name] Date of Service: 04/16/2021 12:30 PM Medical Record Number: [id number] Patient Account Number: [id number] Date of Birth/Sex: 1950/10/10 (70 y.o. M) Treating RN: Donnamarie Poag Primary Care Pantera Winterrowd: Felipa Eth Other Clinician: Referring Aili Casillas: Felipa Eth Treating Boleslaus Holloway/Extender: Skipper Cliche in Treatment: 11 Compression Therapy Performed for Wound Assessment: NonWound Condition Lymphedema - Right Leg Performed By: Junius Argyle, RN Compression Type: Four Layer Electronic Signature(s) Signed: 04/16/2021 1:51:06 PM By: Donnamarie Poag Entered By: Donnamarie Poag on 04/16/2021 12:56:26 [name] ([id number]) -------------------------------------------------------------------------------- Encounter Discharge Information Details Patient Name: [name] Date of Service: 04/16/2021 12:30 PM Medical Record Number: [id number] Patient Account Number: [id number] Date of Birth/Sex: May 25, 1950 (70 y.o. M) Treating RN: Donnamarie Poag Primary Care Anan Dapolito: Felipa Eth Other Clinician: Referring Kynzlee Hucker: Felipa Eth Treating Ilisa Hayworth/Extender: Skipper Cliche in Treatment: 11 Encounter Discharge Information Items Discharge Condition: Stable Ambulatory Status: Cane Discharge Destination: Home Transportation: Other Accompanied By: self Schedule Follow-up Appointment: Yes Clinical Summary of Care: Patient Declined Electronic Signature(s) Signed: 04/16/2021 4:35:36 PM By: [name] RN Previous Signature: 04/16/2021 1:51:06 PM Version By: Donnamarie Poag Entered By: [name] on 04/16/2021 16:35:36 [name] ([id number]) -------------------------------------------------------------------------------- Wound Assessment Details Patient Name: [name] Date of Service: 04/16/2021 12:30 PM Medical Record Number: [id number] Patient Account Number: [id number] Date of Birth/Sex: [birth year] (70 y.o. M) Treating RN: Donnamarie Poag Primary Care Anyi Fels: Felipa Eth Other Clinician: Referring Chayanne Filippi: Felipa Eth Treating Ifeoma Vallin/Extender: Jeri Cos Weeks in Treatment: 11 Wound Status Wound Number: 15 Primary Lymphedema Etiology: Wound Location: Left, Medial Lower Leg Wound Open Wounding Event: Blister Status: Date Acquired: 03/08/2021 Comorbid Hypertension, Peripheral Venous Disease, Type II Weeks Of Treatment: 5 History: Diabetes, Neuropathy, Received Chemotherapy Clustered Wound: No Wound Measurements Length: (cm) 16 Width: (cm) 27 Depth: (cm) 0.1 Area: (cm) 339.292 Volume: (cm) 33.929 % Reduction in Area: -4220% % Reduction in Volume: -764% Epithelialization: None Wound Description Classification: Full Thickness Without Exposed Support Structures Exudate Amount: Large Exudate Type: Serous Exudate Color: amber Foul Odor After Cleansing: No Slough/Fibrino Yes Wound Bed Granulation Amount: Medium (34-66%) Exposed Structure Granulation Quality: Pink Fascia Exposed:  No Necrotic Amount: Medium (34-66%) Fat Layer (Subcutaneous Tissue) Exposed: Yes Necrotic Quality: Adherent Slough Tendon Exposed: No Muscle Exposed: No Joint Exposed: No Bone Exposed: No Treatment Notes Wound #15 (Lower Leg) Wound Laterality: Left, Medial Cleanser Peri-Wound Care Topical Primary Dressing Secondary Dressing Zetuvit Plus Silicone Non-bordered 5x5 (in/in) Discharge Instruction: please do not cut zetuvit as they are not made to be cut causes leak and release fluid collected Secured With Compression Wrap Medichoice 4 layer Compression System, 35-40 mmHG Discharge Instruction: Apply multi-layer wrap as directed. Compression Stockings Add-Ons CAZ, WEAVER ([id number]) Electronic Signature(s) Signed: 04/16/2021 1:51:06 PM By: Donnamarie Poag Entered By: Donnamarie Poag on 04/16/2021 12:55:38

## 2021-04-19 ENCOUNTER — Other Ambulatory Visit: Payer: Self-pay

## 2021-04-19 ENCOUNTER — Encounter: Payer: No Typology Code available for payment source | Admitting: Physician Assistant

## 2021-04-19 DIAGNOSIS — L97822 Non-pressure chronic ulcer of other part of left lower leg with fat layer exposed: Secondary | ICD-10-CM | POA: Diagnosis not present

## 2021-04-19 DIAGNOSIS — I10 Essential (primary) hypertension: Secondary | ICD-10-CM | POA: Diagnosis not present

## 2021-04-19 DIAGNOSIS — E11622 Type 2 diabetes mellitus with other skin ulcer: Secondary | ICD-10-CM | POA: Diagnosis not present

## 2021-04-19 DIAGNOSIS — I890 Lymphedema, not elsewhere classified: Secondary | ICD-10-CM | POA: Diagnosis not present

## 2021-04-19 DIAGNOSIS — I872 Venous insufficiency (chronic) (peripheral): Secondary | ICD-10-CM | POA: Diagnosis not present

## 2021-04-19 NOTE — Progress Notes (Signed)
[name], Mooney ([id number]) Visit Report for 04/19/2021 Arrival Information Details Patient Name: [name], [name] Date of Service: 04/19/2021 8:00 AM Medical Record Number: [id number] Patient Account Number: [id number] Date of Birth/Sex: May 21, 1950 (70 y.o. M) Treating RN: [name] Primary Care Sabena Winner: Felipa Eth Other Clinician: Referring Namari Breton: Felipa Eth Treating Donalyn Schneeberger/Extender: Skipper Cliche in Treatment: 11 Visit Information History Since Last Visit All ordered tests and consults were completed: No Patient Arrived: Ambulatory Added or deleted any medications: No Arrival Time: 07:58 Any new allergies or adverse reactions: No Accompanied By: self Had a fall or experienced change in No Transfer Assistance: None activities of daily living that may affect Patient Identification Verified: Yes risk of falls: Secondary Verification Process Completed: Yes Signs or symptoms of abuse/neglect since last visito No Patient Requires Transmission-Based Precautions: No Hospitalized since last visit: No Patient Has Alerts: No Implantable device outside of the clinic excluding No cellular tissue based products placed in the center since last visit: Has Dressing in Place as Prescribed: Yes Has Compression in Place as Prescribed: Yes Pain Present Now: No Electronic Signature(s) Signed: 04/19/2021 12:20:53 PM By: [name] RN Entered By: [name] on 04/19/2021 08:17:33 [name] ([id number]) -------------------------------------------------------------------------------- Clinic Level of Care Assessment Details Patient Name: [name] Date of Service: 04/19/2021 8:00 AM Medical Record Number: [id number] Patient Account Number: [id number] Date of Birth/Sex: 19-Apr-1951 (70 y.o. M) Treating RN: [name] Primary Care Miracle Criado: Felipa Eth Other Clinician: Referring Ajai Harville: Felipa Eth Treating Brooklyn Alfredo/Extender: Skipper Cliche in  Treatment: 11 Clinic Level of Care Assessment Items TOOL 1 Quantity Score []  - Use when EandM and Procedure is performed on INITIAL visit 0 ASSESSMENTS - Nursing Assessment / Reassessment []  - General Physical Exam (combine w/ comprehensive assessment (listed just below) when performed on new 0 pt. evals) []  - 0 Comprehensive Assessment (HX, ROS, Risk Assessments, Wounds Hx, etc.) ASSESSMENTS - Wound and Skin Assessment / Reassessment []  - Dermatologic / Skin Assessment (not related to wound area) 0 ASSESSMENTS - Ostomy and/or Continence Assessment and Care []  - Incontinence Assessment and Management 0 []  - 0 Ostomy Care Assessment and Management (repouching, etc.) PROCESS - Coordination of Care []  - Simple Patient / Family Education for ongoing care 0 []  - 0 Complex (extensive) Patient / Family Education for ongoing care []  - 0 Staff obtains Programmer, systems, Records, Test Results / Process Orders []  - 0 Staff telephones HHA, Nursing Homes / Clarify orders / etc []  - 0 Routine Transfer to another Facility (non-emergent condition) []  - 0 Routine Hospital Admission (non-emergent condition) []  - 0 New Admissions / Biomedical engineer / Ordering NPWT, Apligraf, etc. []  - 0 Emergency Hospital Admission (emergent condition) PROCESS - Special Needs []  - Pediatric / Minor Patient Management 0 []  - 0 Isolation Patient Management []  - 0 Hearing / Language / Visual special needs []  - 0 Assessment of Community assistance (transportation, D/C planning, etc.) []  - 0 Additional assistance / Altered mentation []  - 0 Support Surface(s) Assessment (bed, cushion, seat, etc.) INTERVENTIONS - Miscellaneous []  - External ear exam 0 []  - 0 Patient Transfer (multiple staff / Civil Service fast streamer / Similar devices) []  - 0 Simple Staple / Suture removal (25 or less) []  - 0 Complex Staple / Suture removal (26 or more) []  - 0 Hypo/Hyperglycemic Management (do not check if billed separately) []  -  0 Ankle / Brachial Index (ABI) - do not check if billed separately Has the patient been seen at the hospital within the last three years: Yes Total  Score: 0 Level Of Care: ____ [name] ([id number]) Electronic Signature(s) Signed: 04/19/2021 12:20:53 PM By: [name] RN Entered By: [name] on 04/19/2021 08:27:04 [name] ([id number]) -------------------------------------------------------------------------------- Compression Therapy Details Patient Name: [name] Date of Service: 04/19/2021 8:00 AM Medical Record Number: [id number] Patient Account Number: [id number] Date of Birth/Sex: August 05, 1950 (70 y.o. M) Treating RN: [name] Primary Care Rolla Servidio: Felipa Eth Other Clinician: Referring Avory Rahimi: Felipa Eth Treating Lathyn Griggs/Extender: Skipper Cliche in Treatment: 11 Compression Therapy Performed for Wound Assessment: Wound #15 Left,Medial Lower Leg Performed By: Clinician [name], RN Compression Type: Four Layer Post Procedure Diagnosis Same as Pre-procedure Electronic Signature(s) Signed: 04/19/2021 12:20:53 PM By: [name] RN Entered By: [name] on 04/19/2021 08:26:14 [name] ([id number]) -------------------------------------------------------------------------------- Compression Therapy Details Patient Name: [name] Date of Service: 04/19/2021 8:00 AM Medical Record Number: [id number] Patient Account Number: [id number] Date of Birth/Sex: 05/10/1950 (70 y.o. M) Treating RN: [name] Primary Care Lina Hitch: Felipa Eth Other Clinician: Referring Ashonte Angelucci: Felipa Eth Treating Haani Bakula/Extender: Skipper Cliche in Treatment: 11 Compression Therapy Performed for Wound Assessment: NonWound Condition Lymphedema - Right Leg Performed By: Clinician [name], RN Compression Type: Four Layer Post Procedure Diagnosis Same as Pre-procedure Electronic Signature(s) Signed: 04/19/2021 12:20:53 PM By:  [name] RN Entered By: [name] on 04/19/2021 08:26:30 [name] ([id number]) -------------------------------------------------------------------------------- Encounter Discharge Information Details Patient Name: [name] Date of Service: 04/19/2021 8:00 AM Medical Record Number: [id number] Patient Account Number: [id number] Date of Birth/Sex: 04/07/1951 (70 y.o. M) Treating RN: [name] Primary Care Jaeden Westbay: Felipa Eth Other Clinician: Referring Staley Budzinski: Felipa Eth Treating Coley Littles/Extender: Skipper Cliche in Treatment: 11 Encounter Discharge Information Items Discharge Condition: Stable Ambulatory Status: Ambulatory Discharge Destination: Home Transportation: Private Auto Accompanied By: self Schedule Follow-up Appointment: Yes Clinical Summary of Care: Patient Declined Electronic Signature(s) Signed: 04/19/2021 12:20:53 PM By: [name] RN Entered By: [name] on 04/19/2021 08:28:05 [name] ([id number]) -------------------------------------------------------------------------------- Lower Extremity Assessment Details Patient Name: [name] Date of Service: 04/19/2021 8:00 AM Medical Record Number: [id number] Patient Account Number: [id number] Date of Birth/Sex: 23-Jun-1950 (70 y.o. M) Treating RN: [name] Primary Care Rushawn Capshaw: Felipa Eth Other Clinician: Referring Mykeisha Dysert: Felipa Eth Treating Justa Hatchell/Extender: Jeri Cos Weeks in Treatment: 11 Edema Assessment Assessed: [Left: No] [Right: No] Edema: [Left: Yes] [Right: Yes] Calf Left: Right: Point of Measurement: 35 cm From Medial Instep 44 cm 42 cm Ankle Left: Right: Point of Measurement: 10 cm From Medial Instep 47 cm 36 cm Electronic Signature(s) Signed: 04/19/2021 12:20:53 PM By: [name] RN Entered By: [name] on 04/19/2021 08:25:27 [name]  ([id number]) -------------------------------------------------------------------------------- Multi Wound Chart Details Patient Name: [name] Date of Service: 04/19/2021 8:00 AM Medical Record Number: [id number] Patient Account Number: [id number] Date of Birth/Sex: 06/13/1950 (70 y.o. M) Treating RN: [name] Primary Care Kirstan Fentress: Felipa Eth Other Clinician: Referring Lemoine Goyne: Felipa Eth Treating Jasmin Trumbull/Extender: Skipper Cliche in Treatment: 11 Vital Signs Height(in): 73 Pulse(bpm): 84 Weight(lbs): 312 Blood Pressure(mmHg): 166/90 Body Mass Index(BMI): 41 Temperature(F): 97.8 Respiratory Rate(breaths/min): 18 Photos: [N/A:N/A] Wound Location: Left, Medial Lower Leg N/A N/A Wounding Event: Blister N/A N/A Primary Etiology: Lymphedema N/A N/A Comorbid History: Hypertension, Peripheral Venous N/A N/A Disease, Type II Diabetes, Neuropathy, Received Chemotherapy Date Acquired: 03/08/2021 N/A N/A Weeks of Treatment: 6 N/A N/A Wound Status: Open N/A N/A Measurements L x W x D (cm) 16x27x0.1 N/A N/A Area (cm) : 339.292 N/A N/A Volume (cm) : 33.929 N/A N/A % Reduction in Area: -4220.00% N/A N/A %  Reduction in Volume: -764.00% N/A N/A Classification: Full Thickness Without Exposed N/A N/A Support Structures Exudate Amount: Large N/A N/A Exudate Type: Serous N/A N/A Exudate Color: amber N/A N/A Granulation Amount: Medium (34-66%) N/A N/A Granulation Quality: Pink N/A N/A Necrotic Amount: Medium (34-66%) N/A N/A Exposed Structures: Fat Layer (Subcutaneous Tissue): N/A N/A Yes Fascia: No Tendon: No Muscle: No Joint: No Bone: No Epithelialization: None N/A N/A Treatment Notes Electronic Signature(s) Signed: 04/19/2021 12:20:53 PM By: [name] RN Entered By: [name] on 04/19/2021 08:25:58 [name] ([id number]) -------------------------------------------------------------------------------- [place]  Details Patient Name: [name] Date of Service: 04/19/2021 8:00 AM Medical Record Number: [id number] Patient Account Number: [id number] Date of Birth/Sex: 1950/05/26 (70 y.o. M) Treating RN: [name] Primary Care Ayad Nieman: Felipa Eth Other Clinician: Referring Baillie Mohammad: Felipa Eth Treating Krisna Omar/Extender: Skipper Cliche in Treatment: 11 Active Inactive Wound/Skin Impairment Nursing Diagnoses: Knowledge deficit related to ulceration/compromised skin integrity Goals: Patient/caregiver will verbalize understanding of skin care regimen Date Initiated: 01/26/2021 Target Resolution Date: 03/28/2021 Goal Status: Active Interventions: Assess patient/caregiver ability to obtain necessary supplies Assess patient/caregiver ability to perform ulcer/skin care regimen upon admission and as needed Assess ulceration(s) every visit Notes: Electronic Signature(s) Signed: 04/19/2021 12:20:53 PM By: [name] RN Entered By: [name] on 04/19/2021 08:25:34 [name] ([id number]) -------------------------------------------------------------------------------- Non-Wound Condition Assessment Details Patient Name: [name] Date of Service: 04/19/2021 8:00 AM Medical Record Number: [id number] Patient Account Number: [id number] Date of Birth/Gender: 26-Aug-1950 (70 y.o. M) Treating RN: [name] Primary Care Physician: Felipa Eth Other Clinician: Referring Physician: Felipa Eth Treating Physician/Extender: Jeri Cos Weeks in Treatment: 11 Non-Wound Condition: Condition: Lymphedema Location: Leg Side: Right Electronic Signature(s) Signed: 04/19/2021 12:20:53 PM By: [name] RN Entered By: [name] on 04/19/2021 08:17:25 [name] ([id number]) -------------------------------------------------------------------------------- Pain Assessment Details Patient Name: [name] Date of Service: 04/19/2021 8:00 AM Medical Record Number:  [id number] Patient Account Number: [id number] Date of Birth/Sex: [birth year] (70 y.o. M) Treating RN: [name] Primary Care Xiomara Sevillano: Felipa Eth Other Clinician: Referring Rosalyn Archambault: Felipa Eth Treating Rudolf Blizard/Extender: Skipper Cliche in Treatment: 11 Active Problems Location of Pain Severity and Description of Pain Patient Has Paino No Site Locations Pain Management and Medication Current Pain Management: Electronic Signature(s) Signed: 04/19/2021 12:20:53 PM By: [name] RN Entered By: [name] on 04/19/2021 08:03:28 [name] ([id number]) -------------------------------------------------------------------------------- Patient/Caregiver Education Details Patient Name: [name] Date of Service: 04/19/2021 8:00 AM Medical Record Number: [id number] Patient Account Number: [id number] Date of Birth/Gender: [birth year] (70 y.o. M) Treating RN: [name] Primary Care Physician: Felipa Eth Other Clinician: Referring Physician: Felipa Eth Treating Physician/Extender: Skipper Cliche in Treatment: 11 Education Assessment Education Provided To: Patient Education Topics Provided Wound/Skin Impairment: Methods: Explain/Verbal Responses: State content correctly Electronic Signature(s) Signed: 04/19/2021 12:20:53 PM By: [name] RN Entered By: [name] on 04/19/2021 08:27:25 [name] ([id number]) -------------------------------------------------------------------------------- Wound Assessment Details Patient Name: [name] Date of Service: 04/19/2021 8:00 AM Medical Record Number: [id number] Patient Account Number: [id number] Date of Birth/Sex: 11-Jun-1950 (70 y.o. M) Treating RN: [name] Primary Care Jaxsun Ciampi: Felipa Eth Other Clinician: Referring Alice Burnside: Felipa Eth Treating Louetta Hollingshead/Extender: Skipper Cliche in Treatment: 11 Wound Status Wound Number: 15 Primary Lymphedema Etiology: Wound  Location: Left, Medial Lower Leg Wound Open Wounding Event: Blister Status: Date Acquired: 03/08/2021 Comorbid Hypertension, Peripheral Venous Disease, Type II Weeks Of Treatment: 6 History: Diabetes, Neuropathy, Received Chemotherapy Clustered Wound: No Photos Wound Measurements Length: (cm) 16 Width: (cm) 27 Depth: (cm) 0.1 Area: (cm) 339.292  Volume: (cm) 33.929 % Reduction in Area: -4220% % Reduction in Volume: -764% Epithelialization: None Tunneling: No Undermining: No Wound Description Classification: Full Thickness Without Exposed Support Structu Exudate Amount: Large Exudate Type: Serous Exudate Color: amber res Foul Odor After Cleansing: No Slough/Fibrino Yes Wound Bed Granulation Amount: Medium (34-66%) Exposed Structure Granulation Quality: Pink Fascia Exposed: No Necrotic Amount: Medium (34-66%) Fat Layer (Subcutaneous Tissue) Exposed: Yes Necrotic Quality: Adherent Slough Tendon Exposed: No Muscle Exposed: No Joint Exposed: No Bone Exposed: No Treatment Notes Wound #15 (Lower Leg) Wound Laterality: Left, Medial Cleanser Peri-Wound Care Topical Primary Dressing EDEM, TIEGS ([id number]) Secondary Dressing Zetuvit Plus Silicone Non-bordered 5x5 (in/in) Discharge Instruction: please do not cut zetuvit as they are not made to be cut causes leak and release fluid collected Secured With Compression Wrap Medichoice 4 layer Compression System, 35-40 mmHG Discharge Instruction: Apply multi-layer wrap as directed. Compression Stockings Add-Ons Electronic Signature(s) Signed: 04/19/2021 12:20:53 PM By: [name] RN Entered By: [name] on 04/19/2021 08:18:15 [name] ([id number]) -------------------------------------------------------------------------------- Vitals Details Patient Name: [name] Date of Service: 04/19/2021 8:00 AM Medical Record Number: [id number] Patient Account Number: [id number] Date of Birth/Sex: Sep 09, 1950 (70  y.o. M) Treating RN: [name] Primary Care Tatisha Cerino: Felipa Eth Other Clinician: Referring Ezechiel Stooksbury: Felipa Eth Treating Ehsan Corvin/Extender: Skipper Cliche in Treatment: 11 Vital Signs Time Taken: 08:03 Temperature (F): 97.8 Height (in): 73 Pulse (bpm): 84 Weight (lbs): 312 Respiratory Rate (breaths/min): 18 Body Mass Index (BMI): 41.2 Blood Pressure (mmHg): 166/90 Reference Range: 80 - 120 mg / dl Electronic Signature(s) Signed: 04/19/2021 12:20:53 PM By: [name] RN Entered By: [name] on 04/19/2021 08:17:41

## 2021-04-19 NOTE — Progress Notes (Signed)
JOTHAM, AHN ([id number]) Visit Report for 04/19/2021 Chief Complaint Document Details Patient Name: JOVONTAE, BANKO Date of Service: 04/19/2021 8:00 AM Medical Record Number: [id number] Patient Account Number: [id number] Date of Birth/Sex: 02-03-1951 (70 y.o. M) Treating RN: [name] Primary Care Provider: Felipa Eth Other Clinician: Referring Provider: Felipa Eth Treating Provider/Extender: Skipper Cliche in Treatment: 11 Information Obtained from: Patient Chief Complaint Left LE ulcers Electronic Signature(s) Signed: 04/19/2021 8:59:30 AM By: Worthy Keeler PA-C Entered By: Worthy Keeler on 04/19/2021 08:59:30 [name] ([id number]) -------------------------------------------------------------------------------- HPI Details Patient Name: [name] Date of Service: 04/19/2021 8:00 AM Medical Record Number: [id number] Patient Account Number: [id number] Date of Birth/Sex: 1951/03/15 (70 y.o. M) Treating RN: [name] Primary Care Provider: Felipa Eth Other Clinician: Referring Provider: Felipa Eth Treating Provider/Extender: Skipper Cliche in Treatment: 11 History of Present Illness HPI Description: 70 year old male who presented to the ER with bilateral lower extremity blisters which had started last week. he has a past medical history of leukemia, diabetes mellitus, hypertension, edema of both lower extremities, his recurrent skin infections, peripheral vascular disease, coronary artery disease, congestive heart failure and peripheral neuropathy. in the ER he was given Rocephin and put on Silvadene cream. he was put on oral doxycycline and was asked to follow-up with the [facility]. His last hemoglobin A1c was 6.6 in December and he checks his blood sugar once a week. He does not have any physicians outside the [place] system. He does not recall any vascular duplex studies done either for arterial or venous disease but was told to wear  compression stockings which he does not use 05/30/2016 -- we have not yet received any of his notes from the [facility] hospital system and his arterial and venous duplex studies are scheduled here in [place] around mid February. We are unable to have his insurance accepted by home health agencies and hence he is getting dressings only once a week. 06/06/16 -- -- I received a call from the patient's PCP at the [facility] and spoke to Dr. Garvin Fila, phone number [phone] and fax number [phone]. She confirmed that no vascular testing was done over the last 5 years and she would be happy to do them if the patient did want them to be done at the [place] and we could fax him a request. Readmission: 70 year old male seen by as in February of this year and was referred to vein and vascular for studies and opinion from the vascular surgeons. The patient returns today with a fresh problem having had blisters on his left lower extremity which have been there for about 5 days and he clearly states that he has been wearing his compression stockings as advised though he could not read the moderate compression and has been wearing light compression. Review of his electronic medical records note that he had lower extremity arterial duplex examination done on 06/23/2016 which showed no hemodynamically significant stenosis in the bilateral lower extremity arterial system. He also had a lower extremity venous reflux examination done on 07/07/2016 and it was noted that he had venous incompetence in the right great saphenous vein and bilateral common femoral veins. Patient was seen by Dr. Tamala Julian on the same day and for some reason his notes do not reflect the venous studies or the arterial studies and he recommended patient do a venous duplex ultrasound to look for reflux and return to see him.he would also consider a lymph pump if required. The patient was told that his workup  was normal and hence the patient  canceled his follow-up appointment. 02/03/17 on evaluation today patient left medial lower extremity blister appears to be doing about the same. It is still continuing to drain and there's still the blistered skin covering the wound bed which is making it difficult for the alternate to do its job. Fortunately there is no evidence of cellulitis. No fevers chills noted. Patient states in general he is not having any significant discomfort. Patient's lower extremity arterial duplex exam revealed that patient was hemodynamically stable with no evidence of stenosis in regard to the bilateral lower extremities. The lower extremity venous reflux exam revealed the patient had venous incontinence noted in the right greater saphenous and bilateral common femoral vein. There is no evidence of deep or superficial vein thrombosis in the bilateral lower extremities. Readmission: 11/12/18 Patient presents for evaluation our clinic today concerning issues that he is having with his left lower extremity. He tells me that a couple weeks ago he began developing blisters on the left lower extremity along with increased swelling. He typically wears his compression stockings on a regular basis is previously been evaluated both here as well is with vascular surgery they would recommend lymphedema pumps but unfortunately that somehow fell through and he never heard anything back from that. Nonetheless I think lymphedema pumps would be beneficial for this patient. He does have a history of hypertension and diabetes. Obviously the chronic venous stasis and lymphedema as well. At this point the blisters have been given in more trouble he states sometimes when the blisters openings able to clean it down with alcohol and it will dry out and do well. Unfortunately that has not been the case this time. He is having some discomfort although this mean these with cleaning the areas he doesn't have discomfort just on a regular basis. He  has not been able to wear his compression stockings since the blisters arose due to the fact that of course it will drain into the socks causing additional issues and he didn't have any way to wrap this otherwise. He has increased to taking his Lasix every day instead of every other day. He sees his primary care provider later this month as well. No fevers, chills, nausea, or vomiting noted at this time. 11/19/18-Patient returns at 1 week, per intake RN the amount of seepage into the compression wraps was definitely improved, overall all the wounds are measuring smaller but continuing silver alginate to the wounds as primary dressing 11/26/18 on evaluation today patient appears to be doing quite well in regard to his left lower [place] ulcers. In fact of the areas that were noted initially he only has two regions still open. There is no evidence of active infection at this time. He still is not heard anything from the company regarding lymphedema pumps as of yet. Again as previously seen vascular they have not recommended any surgical intervention. 12/03/2018 on evaluation today patient actually appears to be doing quite well with regard to his lower extremity ulcers. In fact most of the areas appear to be healed the one spot which does not seem to be completely healed I am unsure of whether or not this is really draining that much but nonetheless there does not appear to be any signs of infection or significant drainage at this point. There is no sign of fever, chills, nausea, vomiting, or diarrhea. Overall I am pleased with how things have progressed I think is very close to being able to transition  to his home compression stockings. FIRAS, GUARDADO ([id number]) 12/10/2018 upon evaluation today patient appears to be doing quite well with regard to his left lower extremity. He has been tolerating the dressing changes without complication. Fortunately there is no signs of active infection at this time. He  appears after thorough evaluation of his leg to only have 1 small area that remains open at this point everything else appears to be almost completely closed. He still have significant swelling of the left lower extremity. We had discussed discussing this with his primary care provider he is not able to see her in person they were at the [facility] and right now the [place] is not seeing patients on site. According to the patient anyway. Subsequently he did speak with her apparently and his primary care provider feels that he may likely have a DVT. With that being said she has not seen his leg she is just going off of his history. Nonetheless that is a concern that the patient now has as well and while I do not feel the DVT is likely we can definitely ensure that that is not the case I will go ahead and see about putting that order in today. Nonetheless otherwise I am in a recommend that we continue with the current wound care measures including the compression therapy most likely. We just need to ensure that his leg is indeed free of any DVTs. 12/17/2018 on evaluation today patient actually appears to be completely healed today. He does have 2 very small areas of blistering although this is not anything too significant at this point which is good news. With that being said I am in agreement with the fact that I think he is completely healed at this point. He does want to get back into his compression stocking. The good news is we have gotten approval from insurance for his lymphedema pumps we received a letter since last saw him last week. The other good news is his study did come back and showed no evidence of a DVT. 12/20/2018 on evaluation today patient presents for follow-up concerning his ongoing issues with his left lower extremity. He was actually discharged last Friday and did fairly well until he states blisters opened this morning. He tells me he has been wearing his compression stocking although  he has a hard time getting this on. There does not appear to be any signs of active infection at this time. No fevers, chills, nausea, vomiting, or diarrhea. 12/27/2018 on evaluation today patient appears to be doing very well with regard to his swelling of the left lower extremity the 4 layer compression wrap seems to have been beneficial for him. Fortunately there is no signs of active infection at this time. Patient has been tolerating the compression wrap without complication and his foot swelling in particular appears to be greatly improved. He does still have a wound on the lateral portion of his left leg I believe this is more of a blister that has now reopened. 01/03/2019 on evaluation today patient actually appears to be doing excellent in regard to his left lower extremity. He did receive his compression pumps and is actually use this 7 times since he was last here in the office. On top of the compression wrap he is now roughly 3 cm better at the calf and 2 cm better at the ankle he also states that his foot seem to go an issue better without even having to use a shoe horn. Obviously I  think this is all evidence that he is doing excellent in this regard. The other good news is he does not appear to have anything open today as far as wounds are concerned. 01/15/2019 on evaluation today patient appears to be doing more poorly yet again with regard to his left lower extremity. He has developed new wounds again after being discharged just recently. Unfortunately this continues to be the case that he will heal and then have subsequent new wounds. The last time I was hopeful that he may not end up coming back too quickly especially since he states he has been using his lymphedema pumps along with wearing his compression. Nonetheless he had a blister on the back of his leg that popped up on the left and this has opened up into an ulceration it is quite painful. 01/22/19 on evaluation today patient  actually appears to be doing well with regard to his wound on the left lower extremity. He's been tolerating the dressing changes without complication including the compression wrap in the wound appears to be significantly smaller today which is great news. Overall very pleased in this regard. 01/29/2019 on evaluation today patient appears to be doing well with regard to his left posterior lower extremity ulcer. He has been tolerating the dressing changes without complication. This is not completely healed but is getting much closer. We did order a Farrow wrap 4000 for him he has received this and has it with him today although I am not sure we are quite ready to start him on that as of yet. We are very close. 02/05/2019 on evaluation today patient actually appears to be doing quite well with regard to his left posterior lower extremity ulcer. He still has a very tiny opening remaining but the fortunate thing is he seems to be healing quite nicely. He also did get his Farrow wrap which I am hoping will help with his edema control as well at home. Fortunately there is no evidence of active infection. 02/12/2019 patient and fortunately appears to be doing poorly in regard to his wounds of the left lower extremity. He was very close to healing therefore we attempted to use his Velcro compression wraps continuing with lymphedema pumps at home. Unfortunately that does not seem to have done very well for him. He tells me that he wore them all the time but again I am not sure why if that is the case that he is having such significant edema. He is still on his fluid pills as well. With that being said there is no obvious sign of infection although I do wonder about the possibility of infection at this time as well. 02/19/2019 unfortunately upon evaluation today patient appears to be doing more poorly with regard to his left lower extremity. He is not showing signs of significant improvement and I think the  biggest issue here is that he does have an infection that appears to likely be Pseudomonas. That is based on the blue-green drainage that were noted at this time. Unfortunately the antibiotic that has been on is not going to take care of this at all. I think they will get a need to switch him to either Levaquin or Cipro and this was discussed with the patient. 02/26/2019 on evaluation today patient's lower extremity on the left appears to be doing significantly better as compared to last evaluation. Fortunately there is no signs of active infection at this time. He has been tolerating the compression wrap without complication in  fact he made it the whole week at this point. He is showing signs of excellent improvement I am very happy in this regard. With that being said he is having some issues with infection we did review the results of his culture which I noted today. He did have a positive finding for Enterobacter as well as Alcaligenes faecalis. Fortunately the Levaquin that I placed him on will work for both which is great news. There is no signs of systemic infection at this point. 10/30; left posterior leg wound in the setting of very significant edema and what looks like chronic venous inflammation. He has compression pumps but does not use them. We have been using 3 layer compression. Silver alginate to the wound as the primary dressing 03/18/2019 on evaluation today patient appears to be doing a little better compared to last time I saw him. He really has not been using his compression pumps he tells me that he is having too much discomfort. He has been keeping his wraps on however. He is only been taking his fluid pills every other day because he states they are not really helping and he has an appointment with his primary care provider at the [facility] tomorrow. Subsequently the wound itself on the left lower extremity does seem to be greatly improved compared to previous. 03/25/2019 on evaluation  today patient appears to be doing better with regard to his wounds on the bilateral lower extremities. The left is doing excellent the right is also doing better although both still do show some signs of open wounds noted at this point unfortunately. Fortunately there is no signs of active infection at this time. The patient also is not really having any significant pain which is good news. Unfortunately there was some confusion with the referral on vascular disease and as far as getting the patient scheduled there can be contacting him later today to do this MONTOYA, BRANDEL ([id number]) fortunately we got this straightened out. 04/01/2019 on evaluation today patient appears to be doing no fevers, chills, nausea, vomiting, or diarrhea. Excellent at this time with regard to his lower extremities. There does not appear to be any open wound at this point which is good news. Fortunately is also no signs of active infection at this time. Overall feel like the patient has done excellent with the compression the problem is every time we got him to this point and then subsequently go to using his own compression things just go right back to where they were. I am not sure how to address this we can try to get an appointment with vascular for 2 weeks now they have yet to call him. Obviously this has become frustrating for the patient as well. I think the issue has just been an honest error as far as scheduling is concerned but nonetheless still worn out the point where I am unsure of which direction we should take. 04/08/2019 on evaluation today patient actually appears to be doing well with regard to his lower extremities. There are no open wounds at this time and things seem to be managing quite nicely as far as the overall edema control is concerned. With that being said he does have his compression socks today for [place] to go ahead and reinitiate therapy in that manner at this point. He is going to be going for  shoes to be measured on Wednesday and then coincidentally he will also be seeing vascular on Thursday. Overall I think this is good news and  again I am hopeful that they will be able to do something for him to help prevent ongoing issues with edema control as well. No fevers, chills, nausea, vomiting, or diarrhea. 04/11/2019 on evaluation today patient actually appears to be doing poorly after just being discharged on Monday of this week. He had been experiencing issues with again blisters especially on the left lower extremity. With that being said he was completely healed and appeared to be doing great this past Monday. He then subsequently has new blisters that formed before his appointment with vascular this morning. He was also measured for shoes in the interim. With that being said we may have figured out what exactly is going on and why he continues to have issues like what we are seeing at this point. He takes his compression stockings off at nighttime and then he ends up having to sleep in his chair for 5-6 hours a night. He sleeps with his feet down he cannot really get him up in the recliner and therefore he is sleeping and the worst possible his position with his feet on the floor for that majority of the time. Again as I explained to him that is about one third at minimum at least one fourth of his day that he spending with his feet dangling down on the ground and the worst possible position they could be. I think this may be what is causing the issue. Subsequently I am leaning toward thinking that he may need a hospital bed in order to elevate his legs. We likely can have to coordinate this with his primary care provider at the [facility]. Readmission: 01/26/2021 this is a patient who presents for repeat evaluation here in the clinic although it is actually been couple of years since have seen him in fact it was December 2020 when I last saw him. Subsequently he never really healed but  did end up being lost to follow-up. He tells me has been having issues ongoing with his lower extremities has bilateral lower extremity lymphedema no real significant or definitive open wounds but in general his lymphedema is way out of control. We were never able to refer him to lymphedema clinic simply due to the fact to be honest we were never able to get him completely healed. I do not see anyone with open wounds. The patient does have evidence of type 2 diabetes mellitus, lymphedema, chronic venous insufficiency, and hypertension. That really has not changed since his last evaluation. 02/09/2021 upon evaluation today patient appears to be doing a little better in regard to his legs although he still having a tremendous amount of drainage especially on the left leg. Fortunately there does not appear to be any evidence of active infection. Of note when we looked into this further it appears that the patient did not have any absorptive dressing on it was just the 4-layer compression wrap. Nonetheless this is probably big part of the issue here. 10/10; he comes in today with 3 large areas on the upper right lower leg likely remanence of denuded blistering under his compression wraps. He has no other wounds on the right. On the left he has the denuded area on the left medial foot and ankle and on the left dorsal foot. Massive lymphedema in both feet dorsally. Using Zetuvit under compression We have increased home health visitation to twice a week to change the dressings and will change it once 02/22/2021 upon evaluation today patient appears to be doing well currently  with regard to his wounds. He has been tolerating the dressing changes without complication. Fortunately there does not appear to be any evidence of active infection which is great news. No fevers, chills, nausea, vomiting, or diarrhea. The biggest issue I see currently is that home health is not putting any medicine on the actual wounds  before wrapping. 03/01/2021 upon evaluation today the patient's right leg actually appears to be doing quite well which is great news there does not appear to be any evidence of active infection at this time. No fevers, chills, nausea, vomiting, or diarrhea. With that being said the patient is having issues on the left foot where he is having significant drainage is also an ammonia smell he does not have any animals at home and this makes me concerned about a bacteria producing urea as a byproduct. Again the possible common organisms will be E. coli, Proteus, and Enterococcus. All 3 of which can be successfully treated with Levaquin. For that reason I think that this may be a good option for [place] to consider placing him on and I did obtain a culture as well for confirmation sake. 03/08/2021 upon evaluation today patient appears to be doing unfortunately still somewhat poorly in regard to his leg ulcerations. He actually has an area on the right leg where he blistered due to the fact that his wrap slid down and caused an area of pinching on his skin and this has led to a significant issue here. 03/15/2021 upon evaluation today patient unfortunately has not been wrapped appropriately with absorptive dressings nor with the appropriate technique for the third layer of the 4-layer compression wrap. These are issues that we continue to try to address with the home health nurse. Also the absorptive dressing that she had was cut in half and therefore that causes things to leak out it does not actually trap the fluid in regard to the top of the foot overall I think that all these combined are really not seeing things improved significantly here. Fortunately there does not appear to be any signs of significant infection at this time which is good news. He still is having a tremendous amount of drainage. 03/22/2021 upon evaluation today patient appears to be draining tremendously. He still continues to tell me  that he is using his pumps 2 times a day and that coupled with that tells me that he is elevating his legs as well. With that being said all things considered I am really just not seeing the improvement we would expect to see with the 4-layer compression wrap and all the above noted. He in fact had an extremely large Zetuvit dressing on both legs and that they were extremely filled to the max with fluid. This is after just being changed just before the weekend and this is Monday. Nonetheless I am concerned about the fact that there is something going on fairly significant that we cannot get any of this under control and that he is draining this significantly. He supposed be having an echocardiogram it sounds like scheduling has been an issue for him as far as getting in sooner. Its something to do with needing his cousin to drive him because of where it sat and he cannot drive himself to this appointment either way I really think he needs to try to see what he can do about making this happen a little sooner. He tells me he will call today. ANDRES, BANTZ ([id number]) 03/29/2021 on evaluation today patient appears to be  doing about the same in regard to his legs. He did get his cardiology appointment moved up to 6 December which is at least good that is better than what it was before mid December. Overall very pleased in that regard. 04/05/2021 upon evaluation today patient unfortunately is still doing fairly poorly. There does not appear to be any signs of active infection at this time. No fevers, chills, nausea, vomiting, or diarrhea. Unfortunately I think until his edema is under control and overall fluid overload there is really not to be much chance that I can do much to get him better. This is quite unfortunate and frustrating both for myself and the patient to be perfectly honest. Nonetheless I think that he really needs to have a conversation both with his primary care provider as well as  cardiologist he sees the PCP on Monday and cardiology on Wednesday of next week. 04/13/2021 upon evaluation today patient appears to be doing poorly in regard to his bilateral lower extremities his left is still worse than the right. With that being said he has a tremendous amount of drainage he did see his primary care provider yesterday there really was not much there to be done from their perspective. He sees cardiology tomorrow. Nonetheless my biggest concern here is simply that if we do not get the edema under control he is going to continue to have drainage and honestly I think at some point he is going to become infected severely that is my main concern. 04/19/2021 upon evaluation today patient appears to be doing poorly still in regard to his legs. Unfortunately there does not appear to be any signs of infection at this point. He does have a tremendous amount of drainage however. We have not seen the results back from the cardiologist and the echocardiogram that was done. It appears that the patient checked out okay as far as that is concerned with regard to ejection fraction though we still have some issues here to be honest with his diastolic function. I am unsure if this is accounting for everything that we are seeing or not. Either way he has a tremendous amount of drainage from his legs that we are just not able to control in the outpatient setting at this point. I have reached out to Dr. Rockey Situ his cardiologist to see once he reviews the sheet if there is anything that he feels like can be done from an outpatient perspective if not then I think the way to go is probably can to be through inpatient admission and diuresis. Otherwise I am not sure how working to get this under control we tried antibiotics, compression wrapping, and I have told the patient to be elevating his legs I am not sure how much he does of this but either way I think that this is still an ongoing issue  nonetheless. Electronic Signature(s) Signed: 04/19/2021 9:52:58 AM By: Worthy Keeler PA-C Entered By: Worthy Keeler on 04/19/2021 09:52:58 [name] ([id number]) -------------------------------------------------------------------------------- Physical Exam Details Patient Name: [name] Date of Service: 04/19/2021 8:00 AM Medical Record Number: [id number] Patient Account Number: [id number] Date of Birth/Sex: 1951-04-30 (70 y.o. M) Treating RN: [name] Primary Care Provider: Felipa Eth Other Clinician: Referring Provider: Felipa Eth Treating Provider/Extender: Skipper Cliche in Treatment: 11 Constitutional Obese and well-hydrated in no acute distress. Respiratory normal breathing without difficulty. Psychiatric this patient is able to make decisions and demonstrates good insight into disease process. Alert and Oriented x 3. pleasant and cooperative.  Notes Patient showed signs of still a tremendous amount of drainage from his legs which is just completely out of control. There is really no way for me to manage this on an outpatient perspective with the way things are currently. We need some kind of intervention here to get things continually moving forward. Electronic Signature(s) Signed: 04/19/2021 9:53:29 AM By: Worthy Keeler PA-C Entered By: Worthy Keeler on 04/19/2021 09:53:28 [name] ([id number]) -------------------------------------------------------------------------------- Physician Orders Details Patient Name: [name] Date of Service: 04/19/2021 8:00 AM Medical Record Number: [id number] Patient Account Number: [id number] Date of Birth/Sex: Nov 05, 1950 (70 y.o. M) Treating RN: [name] Primary Care Provider: Felipa Eth Other Clinician: Referring Provider: Felipa Eth Treating Provider/Extender: Skipper Cliche in Treatment: 11 Verbal / Phone Orders: No Diagnosis Coding Follow-up Appointments o Return  Appointment in 1 week. [facility] for wound care. May utilize formulary equivalent dressing for wound treatment orders unless otherwise specified. Home Health Nurse may visit PRN to address patientos wound care needs. - frequency 3 times per week - patient will be seen once at wound center - home health to see patient 2 times per week on Wed and friday Bathing/ Shower/ Hygiene o May shower with wound dressing protected with water repellent cover or cast protector. Edema Control - Lymphedema / Segmental Compressive Device / Other Bilateral Lower Extremities o Optional: One layer of unna paste to top of compression wrap (to act as an anchor). - Unna paste on feet and calf to secure wrap in place o 4 Layer Compression System Lymphedema. - bi lat, apply zetuvits/ or formulary for extra absorbant dressing around ankles and foot area, 3 times per week COTTON LAYER SPIRAL, LIGHT TAN SPIRAL. WHITE WITH YELLOW LINE FIGURE 8 , COBAN SPIRAL o Elevate, Exercise Daily and Avoid Standing for Long Periods of Time. o Elevate legs to the level of the heart and pump ankles as often as possible o Elevate leg(s) parallel to the floor when sitting. o Compression Pump: Use compression pump on left lower extremity for 60 minutes, twice daily. - Start today o Compression Pump: Use compression pump on right lower extremity for 60 minutes, twice daily. - Start today o DO YOUR BEST to sleep in the bed at night. DO NOT sleep in your recliner. Long hours of sitting in a recliner leads to swelling of the legs and/or potential wounds on your backside. o Other: - Contact prescriber regarding use of diuretics to reduce fluid overload. Off-Loading Bilateral Lower Extremities o Open toe surgical shoe Wound Treatment Wound #15 - Lower Leg Wound Laterality: Left, Medial Secondary Dressing: Zetuvit Plus Silicone Non-bordered 5x5 (in/in) 3 x Per Week/30 Days Discharge  Instructions: please do not cut zetuvit as they are not made to be cut causes leak and release fluid collected Compression Wrap: Medichoice 4 layer Compression System, 35-40 mmHG (Generic) 3 x Per Week/30 Days Discharge Instructions: Apply multi-layer wrap as directed. Electronic Signature(s) Signed: 04/19/2021 9:57:25 AM By: Worthy Keeler PA-C Signed: 04/19/2021 12:20:53 PM By: [name] RN Entered By: [name] on 04/19/2021 08:26:57 [name] ([id number]) -------------------------------------------------------------------------------- Problem List Details Patient Name: [name] Date of Service: 04/19/2021 8:00 AM Medical Record Number: [id number] Patient Account Number: [id number] Date of Birth/Sex: 1950-10-11 (70 y.o. M) Treating RN: [name] Primary Care Provider: Felipa Eth Other Clinician: Referring Provider: Felipa Eth Treating Provider/Extender: Skipper Cliche in Treatment: 11 Active Problems ICD-10 Encounter Code Description Active Date MDM Diagnosis E11.622 Type 2 diabetes  mellitus with other skin ulcer 01/26/2021 No Yes I89.0 Lymphedema, not elsewhere classified 01/26/2021 No Yes I87.2 Venous insufficiency (chronic) (peripheral) 01/26/2021 No Yes L97.822 Non-pressure chronic ulcer of other part of left lower leg with fat layer 01/26/2021 No Yes exposed L97.512 Non-pressure chronic ulcer of other part of right foot with fat layer 01/26/2021 No Yes exposed I10 Essential (primary) hypertension 01/26/2021 No Yes L97.811 Non-pressure chronic ulcer of other part of right lower leg limited to 02/15/2021 No Yes breakdown of skin Inactive Problems Resolved Problems Electronic Signature(s) Signed: 04/19/2021 8:59:22 AM By: Worthy Keeler PA-C Entered By: Worthy Keeler on 04/19/2021 08:59:22 [name] ([id number]) -------------------------------------------------------------------------------- Progress Note Details Patient Name: [name] Date of Service: 04/19/2021 8:00 AM Medical Record Number: [id number] Patient Account Number: [id number] Date of Birth/Sex: Feb 06, 1951 (70 y.o. M) Treating RN: [name] Primary Care Provider: Felipa Eth Other Clinician: Referring Provider: Felipa Eth Treating Provider/Extender: Skipper Cliche in Treatment: 11 Subjective Chief Complaint Information obtained from Patient Left LE ulcers History of Present Illness (HPI) 70 year old male who presented to the ER with bilateral lower extremity blisters which had started last week. he has a past medical history of leukemia, diabetes mellitus, hypertension, edema of both lower extremities, his recurrent skin infections, peripheral vascular disease, coronary artery disease, congestive heart failure and peripheral neuropathy. in the ER he was given Rocephin and put on Silvadene cream. he was put on oral doxycycline and was asked to follow-up with the [facility]. His last hemoglobin A1c was 6.6 in December and he checks his blood sugar once a week. He does not have any physicians outside the [place] system. He does not recall any vascular duplex studies done either for arterial or venous disease but was told to wear compression stockings which he does not use 05/30/2016 -- we have not yet received any of his notes from the [facility] hospital system and his arterial and venous duplex studies are scheduled here in [place] around mid February. We are unable to have his insurance accepted by home health agencies and hence he is getting dressings only once a week. 06/06/16 -- -- I received a call from the patient's PCP at the [name] and spoke to Dr. Garvin Fila, phone number [phone] and fax number [phone]. She confirmed that no vascular testing was done over the last 5 years and she would be happy to do them if the patient did want them to be done at the [place] and we could fax him a request. Readmission: 70 year old male  seen by as in February of this year and was referred to vein and vascular for studies and opinion from the vascular surgeons. The patient returns today with a fresh problem having had blisters on his left lower extremity which have been there for about 5 days and he clearly states that he has been wearing his compression stockings as advised though he could not read the moderate compression and has been wearing light compression. Review of his electronic medical records note that he had lower extremity arterial duplex examination done on 06/23/2016 which showed no hemodynamically significant stenosis in the bilateral lower extremity arterial system. He also had a lower extremity venous reflux examination done on 07/07/2016 and it was noted that he had venous incompetence in the right great saphenous vein and bilateral common femoral veins. Patient was seen by Dr. Tamala Julian on the same day and for some reason his notes do not reflect the venous studies or the arterial studies and  he recommended patient do a venous duplex ultrasound to look for reflux and return to see him.he would also consider a lymph pump if required. The patient was told that his workup was normal and hence the patient canceled his follow-up appointment. 02/03/17 on evaluation today patient left medial lower extremity blister appears to be doing about the same. It is still continuing to drain and there's still the blistered skin covering the wound bed which is making it difficult for the alternate to do its job. Fortunately there is no evidence of cellulitis. No fevers chills noted. Patient states in general he is not having any significant discomfort. Patient's lower extremity arterial duplex exam revealed that patient was hemodynamically stable with no evidence of stenosis in regard to the bilateral lower extremities. The lower extremity venous reflux exam revealed the patient had venous incontinence noted in the right greater  saphenous and bilateral common femoral vein. There is no evidence of deep or superficial vein thrombosis in the bilateral lower extremities. Readmission: 11/12/18 Patient presents for evaluation our clinic today concerning issues that he is having with his left lower extremity. He tells me that a couple weeks ago he began developing blisters on the left lower extremity along with increased swelling. He typically wears his compression stockings on a regular basis is previously been evaluated both here as well is with vascular surgery they would recommend lymphedema pumps but unfortunately that somehow fell through and he never heard anything back from that. Nonetheless I think lymphedema pumps would be beneficial for this patient. He does have a history of hypertension and diabetes. Obviously the chronic venous stasis and lymphedema as well. At this point the blisters have been given in more trouble he states sometimes when the blisters openings able to clean it down with alcohol and it will dry out and do well. Unfortunately that has not been the case this time. He is having some discomfort although this mean these with cleaning the areas he doesn't have discomfort just on a regular basis. He has not been able to wear his compression stockings since the blisters arose due to the fact that of course it will drain into the socks causing additional issues and he didn't have any way to wrap this otherwise. He has increased to taking his Lasix every day instead of every other day. He sees his primary care provider later this month as well. No fevers, chills, nausea, or vomiting noted at this time. 11/19/18-Patient returns at 1 week, per intake RN the amount of seepage into the compression wraps was definitely improved, overall all the wounds are measuring smaller but continuing silver alginate to the wounds as primary dressing 11/26/18 on evaluation today patient appears to be doing quite well in regard to  his left lower [place] ulcers. In fact of the areas that were noted initially he only has two regions still open. There is no evidence of active infection at this time. He still is not heard anything from the company regarding lymphedema pumps as of yet. Again as previously seen vascular they have not recommended any surgical intervention. BLADYN, TIPPS ([id number]) 12/03/2018 on evaluation today patient actually appears to be doing quite well with regard to his lower extremity ulcers. In fact most of the areas appear to be healed the one spot which does not seem to be completely healed I am unsure of whether or not this is really draining that much but nonetheless there does not appear to be any signs of infection or  significant drainage at this point. There is no sign of fever, chills, nausea, vomiting, or diarrhea. Overall I am pleased with how things have progressed I think is very close to being able to transition to his home compression stockings. 12/10/2018 upon evaluation today patient appears to be doing quite well with regard to his left lower extremity. He has been tolerating the dressing changes without complication. Fortunately there is no signs of active infection at this time. He appears after thorough evaluation of his leg to only have 1 small area that remains open at this point everything else appears to be almost completely closed. He still have significant swelling of the left lower extremity. We had discussed discussing this with his primary care provider he is not able to see her in person they were at the [facility] and right now the [place] is not seeing patients on site. According to the patient anyway. Subsequently he did speak with her apparently and his primary care provider feels that he may likely have a DVT. With that being said she has not seen his leg she is just going off of his history. Nonetheless that is a concern that the patient now has as well and while I do not feel  the DVT is likely we can definitely ensure that that is not the case I will go ahead and see about putting that order in today. Nonetheless otherwise I am in a recommend that we continue with the current wound care measures including the compression therapy most likely. We just need to ensure that his leg is indeed free of any DVTs. 12/17/2018 on evaluation today patient actually appears to be completely healed today. He does have 2 very small areas of blistering although this is not anything too significant at this point which is good news. With that being said I am in agreement with the fact that I think he is completely healed at this point. He does want to get back into his compression stocking. The good news is we have gotten approval from insurance for his lymphedema pumps we received a letter since last saw him last week. The other good news is his study did come back and showed no evidence of a DVT. 12/20/2018 on evaluation today patient presents for follow-up concerning his ongoing issues with his left lower extremity. He was actually discharged last Friday and did fairly well until he states blisters opened this morning. He tells me he has been wearing his compression stocking although he has a hard time getting this on. There does not appear to be any signs of active infection at this time. No fevers, chills, nausea, vomiting, or diarrhea. 12/27/2018 on evaluation today patient appears to be doing very well with regard to his swelling of the left lower extremity the 4 layer compression wrap seems to have been beneficial for him. Fortunately there is no signs of active infection at this time. Patient has been tolerating the compression wrap without complication and his foot swelling in particular appears to be greatly improved. He does still have a wound on the lateral portion of his left leg I believe this is more of a blister that has now reopened. 01/03/2019 on evaluation today patient  actually appears to be doing excellent in regard to his left lower extremity. He did receive his compression pumps and is actually use this 7 times since he was last here in the office. On top of the compression wrap he is now roughly 3 cm better  at the calf and 2 cm better at the ankle he also states that his foot seem to go an issue better without even having to use a shoe horn. Obviously I think this is all evidence that he is doing excellent in this regard. The other good news is he does not appear to have anything open today as far as wounds are concerned. 01/15/2019 on evaluation today patient appears to be doing more poorly yet again with regard to his left lower extremity. He has developed new wounds again after being discharged just recently. Unfortunately this continues to be the case that he will heal and then have subsequent new wounds. The last time I was hopeful that he may not end up coming back too quickly especially since he states he has been using his lymphedema pumps along with wearing his compression. Nonetheless he had a blister on the back of his leg that popped up on the left and this has opened up into an ulceration it is quite painful. 01/22/19 on evaluation today patient actually appears to be doing well with regard to his wound on the left lower extremity. He's been tolerating the dressing changes without complication including the compression wrap in the wound appears to be significantly smaller today which is great news. Overall very pleased in this regard. 01/29/2019 on evaluation today patient appears to be doing well with regard to his left posterior lower extremity ulcer. He has been tolerating the dressing changes without complication. This is not completely healed but is getting much closer. We did order a Farrow wrap 4000 for him he has received this and has it with him today although I am not sure we are quite ready to start him on that as of yet. We are very  close. 02/05/2019 on evaluation today patient actually appears to be doing quite well with regard to his left posterior lower extremity ulcer. He still has a very tiny opening remaining but the fortunate thing is he seems to be healing quite nicely. He also did get his Farrow wrap which I am hoping will help with his edema control as well at home. Fortunately there is no evidence of active infection. 02/12/2019 patient and fortunately appears to be doing poorly in regard to his wounds of the left lower extremity. He was very close to healing therefore we attempted to use his Velcro compression wraps continuing with lymphedema pumps at home. Unfortunately that does not seem to have done very well for him. He tells me that he wore them all the time but again I am not sure why if that is the case that he is having such significant edema. He is still on his fluid pills as well. With that being said there is no obvious sign of infection although I do wonder about the possibility of infection at this time as well. 02/19/2019 unfortunately upon evaluation today patient appears to be doing more poorly with regard to his left lower extremity. He is not showing signs of significant improvement and I think the biggest issue here is that he does have an infection that appears to likely be Pseudomonas. That is based on the blue-green drainage that were noted at this time. Unfortunately the antibiotic that has been on is not going to take care of this at all. I think they will get a need to switch him to either Levaquin or Cipro and this was discussed with the patient. 02/26/2019 on evaluation today patient's lower extremity on the left appears  to be doing significantly better as compared to last evaluation. Fortunately there is no signs of active infection at this time. He has been tolerating the compression wrap without complication in fact he made it the whole week at this point. He is showing signs of excellent  improvement I am very happy in this regard. With that being said he is having some issues with infection we did review the results of his culture which I noted today. He did have a positive finding for Enterobacter as well as Alcaligenes faecalis. Fortunately the Levaquin that I placed him on will work for both which is great news. There is no signs of systemic infection at this point. 10/30; left posterior leg wound in the setting of very significant edema and what looks like chronic venous inflammation. He has compression pumps but does not use them. We have been using 3 layer compression. Silver alginate to the wound as the primary dressing 03/18/2019 on evaluation today patient appears to be doing a little better compared to last time I saw him. He really has not been using his compression pumps he tells me that he is having too much discomfort. He has been keeping his wraps on however. He is only been taking his fluid pills every other day because he states they are not really helping and he has an appointment with his primary care provider at the [facility] tomorrow. Subsequently the wound itself on the left lower extremity does seem to be greatly improved compared to previous. TALMADGE, GANAS ([id number]) 03/25/2019 on evaluation today patient appears to be doing better with regard to his wounds on the bilateral lower extremities. The left is doing excellent the right is also doing better although both still do show some signs of open wounds noted at this point unfortunately. Fortunately there is no signs of active infection at this time. The patient also is not really having any significant pain which is good news. Unfortunately there was some confusion with the referral on vascular disease and as far as getting the patient scheduled there can be contacting him later today to do this fortunately we got this straightened out. 04/01/2019 on evaluation today patient appears to be doing no fevers, chills,  nausea, vomiting, or diarrhea. Excellent at this time with regard to his lower extremities. There does not appear to be any open wound at this point which is good news. Fortunately is also no signs of active infection at this time. Overall feel like the patient has done excellent with the compression the problem is every time we got him to this point and then subsequently go to using his own compression things just go right back to where they were. I am not sure how to address this we can try to get an appointment with vascular for 2 weeks now they have yet to call him. Obviously this has become frustrating for the patient as well. I think the issue has just been an honest error as far as scheduling is concerned but nonetheless still worn out the point where I am unsure of which direction we should take. 04/08/2019 on evaluation today patient actually appears to be doing well with regard to his lower extremities. There are no open wounds at this time and things seem to be managing quite nicely as far as the overall edema control is concerned. With that being said he does have his compression socks today for [place] to go ahead and reinitiate therapy in that manner at this point. He  is going to be going for shoes to be measured on Wednesday and then coincidentally he will also be seeing vascular on Thursday. Overall I think this is good news and again I am hopeful that they will be able to do something for him to help prevent ongoing issues with edema control as well. No fevers, chills, nausea, vomiting, or diarrhea. 04/11/2019 on evaluation today patient actually appears to be doing poorly after just being discharged on Monday of this week. He had been experiencing issues with again blisters especially on the left lower extremity. With that being said he was completely healed and appeared to be doing great this past Monday. He then subsequently has new blisters that formed before his appointment with  vascular this morning. He was also measured for shoes in the interim. With that being said we may have figured out what exactly is going on and why he continues to have issues like what we are seeing at this point. He takes his compression stockings off at nighttime and then he ends up having to sleep in his chair for 5-6 hours a night. He sleeps with his feet down he cannot really get him up in the recliner and therefore he is sleeping and the worst possible his position with his feet on the floor for that majority of the time. Again as I explained to him that is about one third at minimum at least one fourth of his day that he spending with his feet dangling down on the ground and the worst possible position they could be. I think this may be what is causing the issue. Subsequently I am leaning toward thinking that he may need a hospital bed in order to elevate his legs. We likely can have to coordinate this with his primary care provider at the [facility]. Readmission: 01/26/2021 this is a patient who presents for repeat evaluation here in the clinic although it is actually been couple of years since have seen him in fact it was December 2020 when I last saw him. Subsequently he never really healed but did end up being lost to follow-up. He tells me has been having issues ongoing with his lower extremities has bilateral lower extremity lymphedema no real significant or definitive open wounds but in general his lymphedema is way out of control. We were never able to refer him to lymphedema clinic simply due to the fact to be honest we were never able to get him completely healed. I do not see anyone with open wounds. The patient does have evidence of type 2 diabetes mellitus, lymphedema, chronic venous insufficiency, and hypertension. That really has not changed since his last evaluation. 02/09/2021 upon evaluation today patient appears to be doing a little better in regard to his legs although he  still having a tremendous amount of drainage especially on the left leg. Fortunately there does not appear to be any evidence of active infection. Of note when we looked into this further it appears that the patient did not have any absorptive dressing on it was just the 4-layer compression wrap. Nonetheless this is probably big part of the issue here. 10/10; he comes in today with 3 large areas on the upper right lower leg likely remanence of denuded blistering under his compression wraps. He has no other wounds on the right. On the left he has the denuded area on the left medial foot and ankle and on the left dorsal foot. Massive lymphedema in both feet dorsally. Using [employer]  under compression We have increased home health visitation to twice a week to change the dressings and will change it once 02/22/2021 upon evaluation today patient appears to be doing well currently with regard to his wounds. He has been tolerating the dressing changes without complication. Fortunately there does not appear to be any evidence of active infection which is great news. No fevers, chills, nausea, vomiting, or diarrhea. The biggest issue I see currently is that home health is not putting any medicine on the actual wounds before wrapping. 03/01/2021 upon evaluation today the patient's right leg actually appears to be doing quite well which is great news there does not appear to be any evidence of active infection at this time. No fevers, chills, nausea, vomiting, or diarrhea. With that being said the patient is having issues on the left foot where he is having significant drainage is also an ammonia smell he does not have any animals at home and this makes me concerned about a bacteria producing urea as a byproduct. Again the possible common organisms will be E. coli, Proteus, and Enterococcus. All 3 of which can be successfully treated with Levaquin. For that reason I think that this may be a good option for [place] to  consider placing him on and I did obtain a culture as well for confirmation sake. 03/08/2021 upon evaluation today patient appears to be doing unfortunately still somewhat poorly in regard to his leg ulcerations. He actually has an area on the right leg where he blistered due to the fact that his wrap slid down and caused an area of pinching on his skin and this has led to a significant issue here. 03/15/2021 upon evaluation today patient unfortunately has not been wrapped appropriately with absorptive dressings nor with the appropriate technique for the third layer of the 4-layer compression wrap. These are issues that we continue to try to address with the home health nurse. Also the absorptive dressing that she had was cut in half and therefore that causes things to leak out it does not actually trap the fluid in regard to the top of the foot overall I think that all these combined are really not seeing things improved significantly here. Fortunately there does not appear to be any signs of significant infection at this time which is good news. He still is having a tremendous amount of drainage. 03/22/2021 upon evaluation today patient appears to be draining tremendously. He still continues to tell me that he is using his pumps 2 times a day and that coupled with that tells me that he is elevating his legs as well. With that being said all things considered I am really just not seeing the improvement we would expect to see with the 4-layer compression wrap and all the above noted. He in fact had an extremely large Zetuvit dressing on both legs and that they were extremely filled to the max with fluid. This is after just being changed just before the weekend and this ALAZAR, CHERIAN ([id number]) is Monday. Nonetheless I am concerned about the fact that there is something going on fairly significant that we cannot get any of this under control and that he is draining this significantly. He supposed be  having an echocardiogram it sounds like scheduling has been an issue for him as far as getting in sooner. Its something to do with needing his cousin to drive him because of where it sat and he cannot drive himself to this appointment either way I really think  he needs to try to see what he can do about making this happen a little sooner. He tells me he will call today. 03/29/2021 on evaluation today patient appears to be doing about the same in regard to his legs. He did get his cardiology appointment moved up to 6 December which is at least good that is better than what it was before mid December. Overall very pleased in that regard. 04/05/2021 upon evaluation today patient unfortunately is still doing fairly poorly. There does not appear to be any signs of active infection at this time. No fevers, chills, nausea, vomiting, or diarrhea. Unfortunately I think until his edema is under control and overall fluid overload there is really not to be much chance that I can do much to get him better. This is quite unfortunate and frustrating both for myself and the patient to be perfectly honest. Nonetheless I think that he really needs to have a conversation both with his primary care provider as well as cardiologist he sees the PCP on Monday and cardiology on Wednesday of next week. 04/13/2021 upon evaluation today patient appears to be doing poorly in regard to his bilateral lower extremities his left is still worse than the right. With that being said he has a tremendous amount of drainage he did see his primary care provider yesterday there really was not much there to be done from their perspective. He sees cardiology tomorrow. Nonetheless my biggest concern here is simply that if we do not get the edema under control he is going to continue to have drainage and honestly I think at some point he is going to become infected severely that is my main concern. 04/19/2021 upon evaluation today patient  appears to be doing poorly still in regard to his legs. Unfortunately there does not appear to be any signs of infection at this point. He does have a tremendous amount of drainage however. We have not seen the results back from the cardiologist and the echocardiogram that was done. It appears that the patient checked out okay as far as that is concerned with regard to ejection fraction though we still have some issues here to be honest with his diastolic function. I am unsure if this is accounting for everything that we are seeing or not. Either way he has a tremendous amount of drainage from his legs that we are just not able to control in the outpatient setting at this point. I have reached out to Dr. Rockey Situ his cardiologist to see once he reviews the sheet if there is anything that he feels like can be done from an outpatient perspective if not then I think the way to go is probably can to be through inpatient admission and diuresis. Otherwise I am not sure how working to get this under control we tried antibiotics, compression wrapping, and I have told the patient to be elevating his legs I am not sure how much he does of this but either way I think that this is still an ongoing issue nonetheless. Objective Constitutional Obese and well-hydrated in no acute distress. Vitals Time Taken: 8:03 AM, Height: 73 in, Weight: 312 lbs, BMI: 41.2, Temperature: 97.8 F, Pulse: 84 bpm, Respiratory Rate: 18 breaths/min, Blood Pressure: 166/90 mmHg. Respiratory normal breathing without difficulty. Psychiatric this patient is able to make decisions and demonstrates good insight into disease process. Alert and Oriented x 3. pleasant and cooperative. General Notes: Patient showed signs of still a tremendous amount of drainage from his legs  which is just completely out of control. There is really no way for me to manage this on an outpatient perspective with the way things are currently. We need some kind of  intervention here to get things continually moving forward. Integumentary (Hair, Skin) Wound #15 status is Open. Original cause of wound was Blister. The date acquired was: 03/08/2021. The wound has been in treatment 6 weeks. The wound is located on the Left,Medial Lower Leg. The wound measures 16cm length x 27cm width x 0.1cm depth; 339.292cm^2 area and 33.929cm^3 volume. There is Fat Layer (Subcutaneous Tissue) exposed. There is no tunneling or undermining noted. There is a large amount of serous drainage noted. There is medium (34-66%) pink granulation within the wound bed. There is a medium (34-66%) amount of necrotic tissue within the wound bed including Adherent Slough. Other Condition(s) Patient presents with Lymphedema located on the Right Leg. Assessment Active Problems ICD-10 DANYEL, GRIESS ([id number]) Type 2 diabetes mellitus with other skin ulcer Lymphedema, not elsewhere classified Venous insufficiency (chronic) (peripheral) Non-pressure chronic ulcer of other part of left lower leg with fat layer exposed Non-pressure chronic ulcer of other part of right foot with fat layer exposed Essential (primary) hypertension Non-pressure chronic ulcer of other part of right lower leg limited to breakdown of skin Procedures Wound #15 Pre-procedure diagnosis of Wound #15 is a Lymphedema located on the Left,Medial Lower Leg . There was a Four Layer Compression Therapy Procedure by [name], RN. Post procedure Diagnosis Wound #15: Same as Pre-Procedure There was a Four Layer Compression Therapy Procedure by [name], RN. Post procedure Diagnosis Wound #: Same as Pre-Procedure Plan Follow-up Appointments: Return Appointment in 1 week. Home Health: [facility] for wound care. May utilize formulary equivalent dressing for wound treatment orders unless otherwise specified. Home Health Nurse may visit PRN to address patient s wound care needs. - frequency 3 times per week -  patient will be seen once at wound center - home health to see patient 2 times per week on Wed and friday Bathing/ Shower/ Hygiene: May shower with wound dressing protected with water repellent cover or cast protector. Edema Control - Lymphedema / Segmental Compressive Device / Other: Optional: One layer of unna paste to top of compression wrap (to act as an anchor). - Unna paste on feet and calf to secure wrap in place 4 Layer Compression System Lymphedema. - bi lat, apply zetuvits/ or formulary for extra absorbant dressing around ankles and foot area, 3 times per week COTTON LAYER SPIRAL, LIGHT TAN SPIRAL. WHITE WITH YELLOW LINE FIGURE 8 , COBAN SPIRAL Elevate, Exercise Daily and Avoid Standing for Long Periods of Time. Elevate legs to the level of the heart and pump ankles as often as possible Elevate leg(s) parallel to the floor when sitting. Compression Pump: Use compression pump on left lower extremity for 60 minutes, twice daily. - Start today Compression Pump: Use compression pump on right lower extremity for 60 minutes, twice daily. - Start today DO YOUR BEST to sleep in the bed at night. DO NOT sleep in your recliner. Long hours of sitting in a recliner leads to swelling of the legs and/or potential wounds on your backside. Other: - Contact prescriber regarding use of diuretics to reduce fluid overload. Off-Loading: Open toe surgical shoe WOUND #15: - Lower Leg Wound Laterality: Left, Medial Secondary Dressing: Zetuvit Plus Silicone Non-bordered 5x5 (in/in) 3 x Per Week/30 Days Discharge Instructions: please do not cut zetuvit as they are not made to be  cut causes leak and release fluid collected Compression Wrap: Medichoice 4 layer Compression System, 35-40 mmHG (Generic) 3 x Per Week/30 Days Discharge Instructions: Apply multi-layer wrap as directed. 1. Would recommend currently that we go ahead and continue with the compression wrapping. This is still the best option even if it  is not the ideal thing I really do think that the patient needs to have diuresis possibly even hospital admission for diuresis but at minimum something more aggressive outpatient. 2. I am also can recommend the patient should be elevating his legs were also continuing to wrap him. Since home health is at this point not able to come out we are doing nurse visits here in the clinic to try to keep things under control. We will see patient back for reevaluation in 1 week here in the clinic. If anything worsens or changes patient will contact our office for additional recommendations. Electronic Signature(s) Signed: 04/19/2021 9:54:16 AM By: Worthy Keeler PA-C Entered By: Worthy Keeler on 04/19/2021 09:54:16 [place], [name] ([id number]) [place], [name] ([id number]) -------------------------------------------------------------------------------- SuperBill Details Patient Name: [name] Date of Service: 04/19/2021 Medical Record Number: [id number] Patient Account Number: [id number] Date of Birth/Sex: 1951/03/05 (70 y.o. M) Treating RN: [name] Primary Care Provider: Felipa Eth Other Clinician: Referring Provider: Felipa Eth Treating Provider/Extender: Skipper Cliche in Treatment: 11 Diagnosis Coding ICD-10 Codes Code Description E11.622 Type 2 diabetes mellitus with other skin ulcer I89.0 Lymphedema, not elsewhere classified I87.2 Venous insufficiency (chronic) (peripheral) L97.822 Non-pressure chronic ulcer of other part of left lower leg with fat layer exposed L97.512 Non-pressure chronic ulcer of other part of right foot with fat layer exposed I10 Essential (primary) hypertension L97.811 Non-pressure chronic ulcer of other part of right lower leg limited to breakdown of skin Facility Procedures CPT4: Description Modifier Quantity Code 94076808 [id number] BILATERAL: Application of multi-layer venous compression system; leg (below knee), including 1 ankle and  foot. Physician Procedures CPT4 Code: 1594585 Description: 92924 - WC PHYS LEVEL 4 - EST PT Modifier: Quantity: 1 CPT4 Code: Description: ICD-10 Diagnosis Description E11.622 Type 2 diabetes mellitus with other skin ulcer I89.0 Lymphedema, not elsewhere classified I87.2 Venous insufficiency (chronic) (peripheral) L97.822 Non-pressure chronic ulcer of other part of left lower  leg with fat layer Modifier: exposed Quantity: Electronic Signature(s) Signed: 04/19/2021 9:54:34 AM By: Worthy Keeler PA-C Entered By: Worthy Keeler on 04/19/2021 09:54:34

## 2021-04-20 ENCOUNTER — Telehealth: Payer: Self-pay

## 2021-04-20 NOTE — Telephone Encounter (Signed)
Able to reach pt regarding his recent ECHO Dr. Rockey Situ had a chance to review his results and advised   "Echo  Normal ejection fraction  No strong indication of fluid overload  No significant valvular heart disease  Would recommend he uses lymphedema compression pumps 1 hour twice a day "  [name] very thankful for the phone call of his results, all questions and concerns were address with nothing further at this time. Will see at next schedule f/u appt.

## 2021-04-21 ENCOUNTER — Other Ambulatory Visit: Payer: Self-pay

## 2021-04-21 DIAGNOSIS — E11622 Type 2 diabetes mellitus with other skin ulcer: Secondary | ICD-10-CM | POA: Diagnosis not present

## 2021-04-21 NOTE — Progress Notes (Signed)
CONER, GIBBARD ([id number]) Visit Report for 04/21/2021 Physician Orders Details Patient Name: [name] Date of Service: 04/21/2021 12:45 PM Medical Record Number: [id number] Patient Account Number: [id number] Date of Birth/Sex: Jul 01, 1950 (70 y.o. M) Treating RN: Levora Dredge Primary Care Provider: Felipa Eth Other Clinician: Referring Provider: Felipa Eth Treating Provider/Extender: Yaakov Guthrie in Treatment: 12 Verbal / Phone Orders: No Diagnosis Coding Follow-up Appointments o Return Appointment in 1 week. [place] for wound care. May utilize formulary equivalent dressing for wound treatment orders unless otherwise specified. Home Health Nurse may visit PRN to address patientos wound care needs. - frequency 3 times per week - patient will be seen once at wound center - home health to see patient 2 times per week on Wed and friday Bathing/ Shower/ Hygiene o May shower with wound dressing protected with water repellent cover or cast protector. Edema Control - Lymphedema / Segmental Compressive Device / Other Bilateral Lower Extremities o Optional: One layer of unna paste to top of compression wrap (to act as an anchor). - Unna paste on feet and calf to secure wrap in place o 4 Layer Compression System Lymphedema. - bi lat, apply zetuvits/ or formulary for extra absorbant dressing around ankles and foot area, 3 times per week COTTON LAYER SPIRAL, LIGHT TAN SPIRAL. WHITE WITH YELLOW LINE FIGURE 8 , COBAN SPIRAL o Elevate, Exercise Daily and Avoid Standing for Long Periods of Time. o Elevate legs to the level of the heart and pump ankles as often as possible o Elevate leg(s) parallel to the floor when sitting. o Compression Pump: Use compression pump on left lower extremity for 60 minutes, twice daily. - Start today o Compression Pump: Use compression pump on right lower extremity for 60 minutes, twice daily. -  Start today o DO YOUR BEST to sleep in the bed at night. DO NOT sleep in your recliner. Long hours of sitting in a recliner leads to swelling of the legs and/or potential wounds on your backside. o Other: - Contact prescriber regarding use of diuretics to reduce fluid overload. Off-Loading Bilateral Lower Extremities o Open toe surgical shoe Wound Treatment Wound #15 - Lower Leg Wound Laterality: Left, Medial Secondary Dressing: Zetuvit Plus Silicone Non-bordered 5x5 (in/in) 3 x Per Week/30 Days Discharge Instructions: please do not cut zetuvit as they are not made to be cut causes leak and release fluid collected Compression Wrap: Medichoice 4 layer Compression System, 35-40 mmHG (Generic) 3 x Per Week/30 Days Discharge Instructions: Apply multi-layer wrap as directed. Electronic Signature(s) Signed: 04/21/2021 1:41:42 PM By: Levora Dredge Signed: 04/21/2021 2:19:58 PM By: Kalman Shan DO Entered By: Levora Dredge on 04/21/2021 13:41:41 [name] ([id number]) -------------------------------------------------------------------------------- SuperBill Details Patient Name: [name] Date of Service: 04/21/2021 Medical Record Number: [id number] Patient Account Number: [id number] Date of Birth/Sex: 06-05-1950 (70 y.o. M) Treating RN: Levora Dredge Primary Care Provider: Felipa Eth Other Clinician: Referring Provider: Felipa Eth Treating Provider/Extender: Yaakov Guthrie in Treatment: 12 Diagnosis Coding ICD-10 Codes Code Description E11.622 Type 2 diabetes mellitus with other skin ulcer I89.0 Lymphedema, not elsewhere classified I87.2 Venous insufficiency (chronic) (peripheral) L97.822 Non-pressure chronic ulcer of other part of left lower leg with fat layer exposed L97.512 Non-pressure chronic ulcer of other part of right foot with fat layer exposed I10 Essential (primary) hypertension L97.811 Non-pressure chronic ulcer of other part of  right lower leg limited to breakdown of skin Facility Procedures CPT4: Description Modifier Quantity Code [id number] BILATERAL: Application of multi-layer venous compression  system; leg (below knee), including 1 ankle and foot. Electronic Signature(s) Signed: 04/21/2021 1:44:20 PM By: Levora Dredge Signed: 04/21/2021 2:19:58 PM By: Kalman Shan DO Entered By: Levora Dredge on 04/21/2021 13:44:20

## 2021-04-21 NOTE — Progress Notes (Signed)
SEGUNDO, MAKELA ([id number]) Visit Report for 04/21/2021 Arrival Information Details Patient Name: [name], [name] Date of Service: 04/21/2021 12:45 PM Medical Record Number: [id number] Patient Account Number: [id number] Date of Birth/Sex: December 24, 1950 (70 y.o. M) Treating RN: Levora Dredge Primary Care Aarilyn Dye: Felipa Eth Other Clinician: Referring Gabe Glace: Felipa Eth Treating Lucile Didonato/Extender: Yaakov Guthrie in Treatment: 12 Visit Information History Since Last Visit Has Dressing in Place as Prescribed: Yes Patient Arrived: Cane Has Compression in Place as Prescribed: Yes Arrival Time: 12:51 Has Footwear/Offloading in Place as Prescribed: Yes Accompanied By: self Pain Present Now: No Transfer Assistance: None Patient Identification Verified: Yes Secondary Verification Process Completed: Yes Patient Requires Transmission-Based Precautions: No Patient Has Alerts: No Electronic Signature(s) Signed: 04/21/2021 1:38:41 PM By: Levora Dredge Entered By: Levora Dredge on 04/21/2021 13:38:41 [name] ([id number]) -------------------------------------------------------------------------------- Compression Therapy Details Patient Name: [name] Date of Service: 04/21/2021 12:45 PM Medical Record Number: [id number] Patient Account Number: [id number] Date of Birth/Sex: 1950-05-19 (70 y.o. M) Treating RN: Levora Dredge Primary Care Alysse Rathe: Felipa Eth Other Clinician: Referring Gustie Bobb: Felipa Eth Treating Jakaree Pickard/Extender: Yaakov Guthrie in Treatment: 12 Compression Therapy Performed for Wound Assessment: Non-Wound Location Performed By: Clinician Levora Dredge, RN Compression Type: Four Layer Location: Lower Extremity, Bilateral Electronic Signature(s) Signed: 04/21/2021 4:05:40 PM By: Levora Dredge Entered By: Levora Dredge on 04/21/2021 13:35:42 [name]  ([id number]) -------------------------------------------------------------------------------- Encounter Discharge Information Details Patient Name: [name] Date of Service: 04/21/2021 12:45 PM Medical Record Number: [id number] Patient Account Number: [id number] Date of Birth/Sex: June 26, 1950 (70 y.o. M) Treating RN: Levora Dredge Primary Care Lively Haberman: Felipa Eth Other Clinician: Referring Deangleo Passage: Felipa Eth Treating Janziel Hockett/Extender: Yaakov Guthrie in Treatment: 12 Encounter Discharge Information Items Discharge Condition: Stable Ambulatory Status: Cane Discharge Destination: Home Transportation: Private Auto Accompanied By: self Schedule Follow-up Appointment: Yes Clinical Summary of Care: Electronic Signature(s) Signed: 04/21/2021 1:42:27 PM By: Levora Dredge Entered By: Levora Dredge on 04/21/2021 13:42:26 [name] ([id number]) -------------------------------------------------------------------------------- Wound Assessment Details Patient Name: [name] Date of Service: 04/21/2021 12:45 PM Medical Record Number: [id number] Patient Account Number: [id number] Date of Birth/Sex: [birth year] (70 y.o. M) Treating RN: Levora Dredge Primary Care Mellie Buccellato: Felipa Eth Other Clinician: Referring Vernell Townley: Felipa Eth Treating Kelsen Celona/Extender: Yaakov Guthrie in Treatment: 12 Wound Status Wound Number: 15 Primary Lymphedema Etiology: Wound Location: Left, Medial Lower Leg Wound Open Wounding Event: Blister Status: Date Acquired: 03/08/2021 Comorbid Hypertension, Peripheral Venous Disease, Type II Weeks Of Treatment: 6 History: Diabetes, Neuropathy, Received Chemotherapy Clustered Wound: No Wound Measurements Length: (cm) 16 Width: (cm) 27 Depth: (cm) 0.1 Area: (cm) 339.292 Volume: (cm) 33.929 % Reduction in Area: -4220% % Reduction in Volume: -764% Epithelialization: None Wound Description Classification:  Full Thickness Without Exposed Support Structures Exudate Amount: Large Exudate Type: Serous Exudate Color: amber Foul Odor After Cleansing: No Slough/Fibrino Yes Wound Bed Granulation Amount: Medium (34-66%) Exposed Structure Granulation Quality: Pink Fascia Exposed: No Necrotic Amount: Medium (34-66%) Fat Layer (Subcutaneous Tissue) Exposed: Yes Necrotic Quality: Adherent Slough Tendon Exposed: No Muscle Exposed: No Joint Exposed: No Bone Exposed: No Treatment Notes Wound #15 (Lower Leg) Wound Laterality: Left, Medial Cleanser Peri-Wound Care Topical Primary Dressing Secondary Dressing Zetuvit Plus Silicone Non-bordered 5x5 (in/in) Discharge Instruction: please do not cut zetuvit as they are not made to be cut causes leak and release fluid collected Secured With Compression Wrap Medichoice 4 layer Compression System, 35-40 mmHG Discharge Instruction: Apply multi-layer wrap as directed. Compression Stockings Add-Ons NAYSHAWN, MESTA ([id number]) Electronic Signature(s) Signed: 04/21/2021 1:40:23 PM By: Levora Dredge Entered By:  Levora Dredge on 04/21/2021 13:40:23

## 2021-04-22 ENCOUNTER — Other Ambulatory Visit: Payer: No Typology Code available for payment source

## 2021-04-23 ENCOUNTER — Other Ambulatory Visit: Payer: Self-pay

## 2021-04-23 ENCOUNTER — Telehealth (INDEPENDENT_AMBULATORY_CARE_PROVIDER_SITE_OTHER): Payer: Self-pay

## 2021-04-23 DIAGNOSIS — E11622 Type 2 diabetes mellitus with other skin ulcer: Secondary | ICD-10-CM | POA: Diagnosis not present

## 2021-04-23 NOTE — Telephone Encounter (Signed)
A call came in regarding this patient for wound care and upon discussing the patient it was realized that they were calling the wrong facility.

## 2021-04-26 ENCOUNTER — Other Ambulatory Visit: Payer: Self-pay

## 2021-04-26 ENCOUNTER — Observation Stay: Payer: No Typology Code available for payment source

## 2021-04-26 ENCOUNTER — Inpatient Hospital Stay
Admission: EM | Admit: 2021-04-26 | Discharge: 2021-04-29 | DRG: 300 | Disposition: A | Discharge status: Home Health | Payer: No Typology Code available for payment source | Attending: Internal Medicine | Admitting: Internal Medicine

## 2021-04-26 ENCOUNTER — Encounter: Payer: No Typology Code available for payment source | Admitting: Physician Assistant

## 2021-04-26 DIAGNOSIS — E785 Hyperlipidemia, unspecified: Secondary | ICD-10-CM | POA: Diagnosis present

## 2021-04-26 DIAGNOSIS — I5032 Chronic diastolic (congestive) heart failure: Secondary | ICD-10-CM | POA: Diagnosis present

## 2021-04-26 DIAGNOSIS — Z79899 Other long term (current) drug therapy: Secondary | ICD-10-CM | POA: Diagnosis not present

## 2021-04-26 DIAGNOSIS — L97822 Non-pressure chronic ulcer of other part of left lower leg with fat layer exposed: Secondary | ICD-10-CM | POA: Diagnosis not present

## 2021-04-26 DIAGNOSIS — I10 Essential (primary) hypertension: Secondary | ICD-10-CM | POA: Diagnosis present

## 2021-04-26 DIAGNOSIS — L97919 Non-pressure chronic ulcer of unspecified part of right lower leg with unspecified severity: Secondary | ICD-10-CM | POA: Diagnosis present

## 2021-04-26 DIAGNOSIS — Z87891 Personal history of nicotine dependence: Secondary | ICD-10-CM

## 2021-04-26 DIAGNOSIS — I739 Peripheral vascular disease, unspecified: Secondary | ICD-10-CM | POA: Diagnosis present

## 2021-04-26 DIAGNOSIS — I130 Hypertensive heart and chronic kidney disease with heart failure and stage 1 through stage 4 chronic kidney disease, or unspecified chronic kidney disease: Secondary | ICD-10-CM | POA: Diagnosis present

## 2021-04-26 DIAGNOSIS — Z856 Personal history of leukemia: Secondary | ICD-10-CM

## 2021-04-26 DIAGNOSIS — E1151 Type 2 diabetes mellitus with diabetic peripheral angiopathy without gangrene: Secondary | ICD-10-CM | POA: Diagnosis not present

## 2021-04-26 DIAGNOSIS — L03115 Cellulitis of right lower limb: Secondary | ICD-10-CM | POA: Diagnosis present

## 2021-04-26 DIAGNOSIS — E11622 Type 2 diabetes mellitus with other skin ulcer: Secondary | ICD-10-CM | POA: Diagnosis present

## 2021-04-26 DIAGNOSIS — L97512 Non-pressure chronic ulcer of other part of right foot with fat layer exposed: Secondary | ICD-10-CM | POA: Diagnosis not present

## 2021-04-26 DIAGNOSIS — N1831 Chronic kidney disease, stage 3a: Secondary | ICD-10-CM | POA: Diagnosis present

## 2021-04-26 DIAGNOSIS — Z6841 Body Mass Index (BMI) 40.0 and over, adult: Secondary | ICD-10-CM

## 2021-04-26 DIAGNOSIS — I110 Hypertensive heart disease with heart failure: Secondary | ICD-10-CM | POA: Diagnosis not present

## 2021-04-26 DIAGNOSIS — I872 Venous insufficiency (chronic) (peripheral): Secondary | ICD-10-CM | POA: Diagnosis not present

## 2021-04-26 DIAGNOSIS — L089 Local infection of the skin and subcutaneous tissue, unspecified: Secondary | ICD-10-CM | POA: Diagnosis not present

## 2021-04-26 DIAGNOSIS — T148XXA Other injury of unspecified body region, initial encounter: Secondary | ICD-10-CM

## 2021-04-26 DIAGNOSIS — Z7984 Long term (current) use of oral hypoglycemic drugs: Secondary | ICD-10-CM | POA: Diagnosis not present

## 2021-04-26 DIAGNOSIS — Z7982 Long term (current) use of aspirin: Secondary | ICD-10-CM | POA: Diagnosis not present

## 2021-04-26 DIAGNOSIS — L97929 Non-pressure chronic ulcer of unspecified part of left lower leg with unspecified severity: Secondary | ICD-10-CM | POA: Diagnosis present

## 2021-04-26 DIAGNOSIS — L03116 Cellulitis of left lower limb: Secondary | ICD-10-CM | POA: Diagnosis present

## 2021-04-26 DIAGNOSIS — D509 Iron deficiency anemia, unspecified: Secondary | ICD-10-CM | POA: Diagnosis present

## 2021-04-26 DIAGNOSIS — E1122 Type 2 diabetes mellitus with diabetic chronic kidney disease: Secondary | ICD-10-CM | POA: Diagnosis present

## 2021-04-26 DIAGNOSIS — Z91199 Patient's noncompliance with other medical treatment and regimen due to unspecified reason: Secondary | ICD-10-CM | POA: Diagnosis not present

## 2021-04-26 DIAGNOSIS — N179 Acute kidney failure, unspecified: Secondary | ICD-10-CM | POA: Diagnosis present

## 2021-04-26 DIAGNOSIS — I890 Lymphedema, not elsewhere classified: Secondary | ICD-10-CM | POA: Diagnosis present

## 2021-04-26 DIAGNOSIS — I5040 Unspecified combined systolic (congestive) and diastolic (congestive) heart failure: Secondary | ICD-10-CM | POA: Diagnosis not present

## 2021-04-26 DIAGNOSIS — Z20822 Contact with and (suspected) exposure to covid-19: Secondary | ICD-10-CM | POA: Diagnosis present

## 2021-04-26 DIAGNOSIS — E1129 Type 2 diabetes mellitus with other diabetic kidney complication: Secondary | ICD-10-CM | POA: Diagnosis present

## 2021-04-26 DIAGNOSIS — N182 Chronic kidney disease, stage 2 (mild): Secondary | ICD-10-CM

## 2021-04-26 DIAGNOSIS — E782 Mixed hyperlipidemia: Secondary | ICD-10-CM | POA: Diagnosis present

## 2021-04-26 DIAGNOSIS — M7989 Other specified soft tissue disorders: Secondary | ICD-10-CM | POA: Diagnosis not present

## 2021-04-26 DIAGNOSIS — R600 Localized edema: Secondary | ICD-10-CM

## 2021-04-26 DIAGNOSIS — L97811 Non-pressure chronic ulcer of other part of right lower leg limited to breakdown of skin: Secondary | ICD-10-CM | POA: Diagnosis not present

## 2021-04-26 DIAGNOSIS — N183 Chronic kidney disease, stage 3 unspecified: Secondary | ICD-10-CM | POA: Diagnosis present

## 2021-04-26 DIAGNOSIS — R609 Edema, unspecified: Secondary | ICD-10-CM

## 2021-04-26 LAB — CBC WITH DIFFERENTIAL/PLATELET
Abs Immature Granulocytes: 0.01 10*3/uL (ref 0.00–0.07)
Basophils Absolute: 0 10*3/uL (ref 0.0–0.1)
Basophils Relative: 1 %
Eosinophils Absolute: 0.3 10*3/uL (ref 0.0–0.5)
Eosinophils Relative: 5 %
HCT: 29.8 % — ABNORMAL LOW (ref 39.0–52.0)
Hemoglobin: 8.8 g/dL — ABNORMAL LOW (ref 13.0–17.0)
Immature Granulocytes: 0 %
Lymphocytes Relative: 17 %
Lymphs Abs: 1.1 10*3/uL (ref 0.7–4.0)
MCH: 24.6 pg — ABNORMAL LOW (ref 26.0–34.0)
MCHC: 29.5 g/dL — ABNORMAL LOW (ref 30.0–36.0)
MCV: 83.2 fL (ref 80.0–100.0)
Monocytes Absolute: 0.5 10*3/uL (ref 0.1–1.0)
Monocytes Relative: 8 %
Neutro Abs: 4.2 10*3/uL (ref 1.7–7.7)
Neutrophils Relative %: 69 %
Platelets: 204 10*3/uL (ref 150–400)
RBC: 3.58 MIL/uL — ABNORMAL LOW (ref 4.22–5.81)
RDW: 17.4 % — ABNORMAL HIGH (ref 11.5–15.5)
WBC: 6.1 10*3/uL (ref 4.0–10.5)
nRBC: 0 % (ref 0.0–0.2)

## 2021-04-26 LAB — COMPREHENSIVE METABOLIC PANEL
ALT: 21 U/L (ref 0–44)
AST: 24 U/L (ref 15–41)
Albumin: 3.2 g/dL — ABNORMAL LOW (ref 3.5–5.0)
Alkaline Phosphatase: 52 U/L (ref 38–126)
Anion gap: 7 (ref 5–15)
BUN: 30 mg/dL — ABNORMAL HIGH (ref 8–23)
CO2: 27 mmol/L (ref 22–32)
Calcium: 9 mg/dL (ref 8.9–10.3)
Chloride: 106 mmol/L (ref 98–111)
Creatinine, Ser: 1.3 mg/dL — ABNORMAL HIGH (ref 0.61–1.24)
GFR, Estimated: 59 mL/min — ABNORMAL LOW (ref >60)
Glucose, Bld: 218 mg/dL — ABNORMAL HIGH (ref 70–99)
Potassium: 3.8 mmol/L (ref 3.5–5.1)
Sodium: 140 mmol/L (ref 135–145)
Total Bilirubin: 0.4 mg/dL (ref 0.3–1.2)
Total Protein: 7.4 g/dL (ref 6.5–8.1)

## 2021-04-26 LAB — RESP PANEL BY RT-PCR (FLU A&B, COVID) ARPGX2
Influenza A by PCR: NEGATIVE
Influenza B by PCR: NEGATIVE
SARS Coronavirus 2 by RT PCR: NEGATIVE

## 2021-04-26 LAB — LACTIC ACID, PLASMA
Lactic Acid, Venous: 1.4 mmol/L (ref 0.5–1.9)
Lactic Acid, Venous: 1.4 mmol/L (ref 0.5–1.9)

## 2021-04-26 LAB — CBG MONITORING, ED
Glucose-Capillary: 122 mg/dL — ABNORMAL HIGH (ref 70–99)
Glucose-Capillary: 141 mg/dL — ABNORMAL HIGH (ref 70–99)

## 2021-04-26 LAB — C-REACTIVE PROTEIN: CRP: 2.1 mg/dL — ABNORMAL HIGH (ref <1.0)

## 2021-04-26 LAB — BRAIN NATRIURETIC PEPTIDE: B Natriuretic Peptide: 92 pg/mL (ref 0.0–100.0)

## 2021-04-26 LAB — SEDIMENTATION RATE: Sed Rate: 85 mm/hr — ABNORMAL HIGH (ref 0–20)

## 2021-04-26 MED ORDER — ASPIRIN 81 MG PO CHEW
81.0000 mg | CHEWABLE_TABLET | Freq: Every day | ORAL | Status: DC
  Administered 2021-04-27 – 2021-04-29 (×3): 81 mg via ORAL
  Filled 2021-04-26 (×3): qty 1

## 2021-04-26 MED ORDER — HYDRALAZINE HCL 50 MG PO TABS
75.0000 mg | ORAL_TABLET | Freq: Three times a day (TID) | ORAL | Status: DC
  Administered 2021-04-26 – 2021-04-29 (×9): 75 mg via ORAL
  Filled 2021-04-26: qty 1
  Filled 2021-04-26: qty 2
  Filled 2021-04-26 (×3): qty 1
  Filled 2021-04-26: qty 2
  Filled 2021-04-26: qty 1
  Filled 2021-04-26: qty 2
  Filled 2021-04-26: qty 1

## 2021-04-26 MED ORDER — FERROUS SULFATE 325 (65 FE) MG PO TABS
325.0000 mg | ORAL_TABLET | Freq: Every day | ORAL | Status: DC
  Administered 2021-04-27 – 2021-04-29 (×3): 325 mg via ORAL
  Filled 2021-04-26 (×3): qty 1

## 2021-04-26 MED ORDER — HYDRALAZINE HCL 20 MG/ML IJ SOLN: 5.0000 mg | INTRAMUSCULAR | Status: DC | PRN

## 2021-04-26 MED ORDER — SODIUM CHLORIDE 0.9 % IV SOLN
1.0000 g | Freq: Once | INTRAVENOUS | Status: AC
  Administered 2021-04-26: 15:00:00 1 g via INTRAVENOUS
  Filled 2021-04-26: qty 10

## 2021-04-26 MED ORDER — PRAVASTATIN SODIUM 20 MG PO TABS
40.0000 mg | ORAL_TABLET | Freq: Every day | ORAL | Status: DC
  Administered 2021-04-28 – 2021-04-29 (×2): 40 mg via ORAL
  Filled 2021-04-26 (×3): qty 2

## 2021-04-26 MED ORDER — SILVER SULFADIAZINE 1 % EX CREA: TOPICAL_CREAM | Freq: Every day | CUTANEOUS | Status: DC

## 2021-04-26 MED ORDER — VANCOMYCIN HCL IN DEXTROSE 1-5 GM/200ML-% IV SOLN
1000.0000 mg | Freq: Once | INTRAVENOUS | Status: AC
  Administered 2021-04-26: 17:00:00 1000 mg via INTRAVENOUS
  Filled 2021-04-26: qty 200

## 2021-04-26 MED ORDER — INSULIN ASPART 100 UNIT/ML IJ SOLN
0.0000 [IU] | Freq: Three times a day (TID) | INTRAMUSCULAR | Status: DC
  Administered 2021-04-28: 12:00:00 3 [IU] via SUBCUTANEOUS
  Administered 2021-04-28: 17:00:00 1 [IU] via SUBCUTANEOUS
  Administered 2021-04-29: 10:00:00 2 [IU] via SUBCUTANEOUS
  Administered 2021-04-29: 13:00:00 3 [IU] via SUBCUTANEOUS
  Filled 2021-04-26 (×4): qty 1

## 2021-04-26 MED ORDER — SODIUM CHLORIDE 0.9 % IV SOLN
1.0000 g | Freq: Once | INTRAVENOUS | Status: AC
  Administered 2021-04-26: 17:00:00 1 g via INTRAVENOUS
  Filled 2021-04-26: qty 10

## 2021-04-26 MED ORDER — LISINOPRIL 10 MG PO TABS
40.0000 mg | ORAL_TABLET | Freq: Every day | ORAL | Status: DC
  Filled 2021-04-26: qty 4

## 2021-04-26 MED ORDER — INSULIN ASPART 100 UNIT/ML IJ SOLN: 0.0000 [IU] | Freq: Every day | INTRAMUSCULAR | Status: DC

## 2021-04-26 MED ORDER — ADULT MULTIVITAMIN W/MINERALS CH
1.0000 | ORAL_TABLET | Freq: Every day | ORAL | Status: DC
  Administered 2021-04-27 – 2021-04-29 (×3): 1 via ORAL
  Filled 2021-04-26 (×3): qty 1

## 2021-04-26 MED ORDER — FUROSEMIDE 10 MG/ML IJ SOLN
40.0000 mg | Freq: Two times a day (BID) | INTRAMUSCULAR | Status: DC
  Administered 2021-04-27 – 2021-04-29 (×5): 40 mg via INTRAVENOUS
  Filled 2021-04-26 (×5): qty 4

## 2021-04-26 MED ORDER — SODIUM CHLORIDE 0.9 % IV SOLN
2.0000 g | INTRAVENOUS | Status: DC
  Administered 2021-04-27: 18:00:00 2 g via INTRAVENOUS
  Filled 2021-04-26 (×2): qty 20

## 2021-04-26 MED ORDER — NYSTATIN 100000 UNIT/GM EX POWD
Freq: Every day | CUTANEOUS | Status: DC
  Filled 2021-04-26: qty 15

## 2021-04-26 MED ORDER — ENOXAPARIN SODIUM 40 MG/0.4ML IJ SOSY
40.0000 mg | PREFILLED_SYRINGE | INTRAMUSCULAR | Status: DC
  Administered 2021-04-26 – 2021-04-29 (×4): 40 mg via SUBCUTANEOUS
  Filled 2021-04-26 (×4): qty 0.4

## 2021-04-26 MED ORDER — ACETAMINOPHEN 160 MG/5ML PO SOLN
650.0000 mg | Freq: Four times a day (QID) | ORAL | Status: DC | PRN
  Filled 2021-04-26: qty 20.3

## 2021-04-26 MED ORDER — VANCOMYCIN HCL 1750 MG/350ML IV SOLN
1750.0000 mg | INTRAVENOUS | Status: DC
  Administered 2021-04-27: 17:00:00 1750 mg via INTRAVENOUS
  Filled 2021-04-26 (×2): qty 350

## 2021-04-26 MED ORDER — NYSTATIN 100000 UNIT/GM EX POWD: Freq: Every day | CUTANEOUS | Status: DC

## 2021-04-26 MED ORDER — VANCOMYCIN HCL 1500 MG/300ML IV SOLN
1500.0000 mg | Freq: Once | INTRAVENOUS | Status: DC
  Filled 2021-04-26: qty 300

## 2021-04-26 MED ORDER — METRONIDAZOLE 500 MG/100ML IV SOLN
500.0000 mg | Freq: Two times a day (BID) | INTRAVENOUS | Status: DC
  Administered 2021-04-26 – 2021-04-28 (×4): 500 mg via INTRAVENOUS
  Filled 2021-04-26 (×5): qty 100

## 2021-04-26 MED ORDER — ONDANSETRON HCL 4 MG/2ML IJ SOLN: 4.0000 mg | Freq: Three times a day (TID) | INTRAMUSCULAR | Status: DC | PRN

## 2021-04-26 MED ORDER — FUROSEMIDE 10 MG/ML IJ SOLN
40.0000 mg | Freq: Once | INTRAMUSCULAR | Status: AC
  Administered 2021-04-26: 15:00:00 40 mg via INTRAVENOUS
  Filled 2021-04-26: qty 4

## 2021-04-26 MED ORDER — VITAMIN D 25 MCG (1000 UNIT) PO TABS
2000.0000 [IU] | ORAL_TABLET | Freq: Every day | ORAL | Status: DC
  Administered 2021-04-27 – 2021-04-29 (×3): 2000 [IU] via ORAL
  Filled 2021-04-26 (×3): qty 2

## 2021-04-26 MED ORDER — SILVER SULFADIAZINE 1 % EX CREA
TOPICAL_CREAM | Freq: Every day | CUTANEOUS | Status: DC
  Filled 2021-04-26: qty 85

## 2021-04-26 MED ORDER — SERTRALINE HCL 50 MG PO TABS
200.0000 mg | ORAL_TABLET | Freq: Every day | ORAL | Status: DC
  Administered 2021-04-27 – 2021-04-29 (×3): 200 mg via ORAL
  Filled 2021-04-26 (×4): qty 4

## 2021-04-26 MED ORDER — OMEGA-3-ACID ETHYL ESTERS 1 G PO CAPS
1.0000 g | ORAL_CAPSULE | Freq: Every day | ORAL | Status: DC
  Administered 2021-04-27 – 2021-04-29 (×3): 1 g via ORAL
  Filled 2021-04-26 (×4): qty 1

## 2021-04-26 NOTE — Progress Notes (Addendum)
MAXTYN, NUZUM ([id number]) Visit Report for 04/26/2021 Chief Complaint Document Details Patient Name: [name], [name] Date of Service: 04/26/2021 8:00 AM Medical Record Number: [id number] Patient Account Number: [id number] Date of Birth/Sex: [birth year] (70 y.o. M) Treating RN: [name] Primary Care Provider: Felipa Eth Other Clinician: Referring Provider: Felipa Eth Treating Provider/Extender: Skipper Cliche in Treatment: 12 Information Obtained from: Patient Chief Complaint Left LE ulcers Electronic Signature(s) Signed: 04/26/2021 8:30:47 AM By: Worthy Keeler PA-C Entered By: Worthy Keeler on 04/26/2021 08:30:46 [name] ([id number]) -------------------------------------------------------------------------------- HPI Details Patient Name: [name] Date of Service: 04/26/2021 8:00 AM Medical Record Number: [id number] Patient Account Number: [id number] Date of Birth/Sex: 28-Aug-1950 (70 y.o. M) Treating RN: [name] Primary Care Provider: Felipa Eth Other Clinician: Referring Provider: Felipa Eth Treating Provider/Extender: Skipper Cliche in Treatment: 12 History of Present Illness HPI Description: 70 year old male who presented to the ER with bilateral lower extremity blisters which had started last week. he has a past medical history of leukemia, diabetes mellitus, hypertension, edema of both lower extremities, his recurrent skin infections, peripheral vascular disease, coronary artery disease, congestive heart failure and peripheral neuropathy. in the ER he was given Rocephin and put on Silvadene cream. he was put on oral doxycycline and was asked to follow-up with the [facility]. His last hemoglobin A1c was 6.6 in December and he checks his blood sugar once a week. He does not have any physicians outside the [place] system. He does not recall any vascular duplex studies done either for arterial or venous disease but was told to wear  compression stockings which he does not use 05/30/2016 -- we have not yet received any of his notes from the [facility] hospital system and his arterial and venous duplex studies are scheduled here in [place] around mid February. We are unable to have his insurance accepted by home health agencies and hence he is getting dressings only once a week. 06/06/16 -- -- I received a call from the patient's PCP at the [facility] and spoke to Dr. Garvin Fila, phone number [phone] and fax number [phone]. She confirmed that no vascular testing was done over the last 5 years and she would be happy to do them if the patient did want them to be done at the [place] and we could fax him a request. Readmission: 70 year old male seen by as in February of this year and was referred to vein and vascular for studies and opinion from the vascular surgeons. The patient returns today with a fresh problem having had blisters on his left lower extremity which have been there for about 5 days and he clearly states that he has been wearing his compression stockings as advised though he could not read the moderate compression and has been wearing light compression. Review of his electronic medical records note that he had lower extremity arterial duplex examination done on 06/23/2016 which showed no hemodynamically significant stenosis in the bilateral lower extremity arterial system. He also had a lower extremity venous reflux examination done on 07/07/2016 and it was noted that he had venous incompetence in the right great saphenous vein and bilateral common femoral veins. Patient was seen by Dr. Tamala Julian on the same day and for some reason his notes do not reflect the venous studies or the arterial studies and he recommended patient do a venous duplex ultrasound to look for reflux and return to see him.he would also consider a lymph pump if required. The patient was told that his workup  was normal and hence the patient  canceled his follow-up appointment. 02/03/17 on evaluation today patient left medial lower extremity blister appears to be doing about the same. It is still continuing to drain and there's still the blistered skin covering the wound bed which is making it difficult for the alternate to do its job. Fortunately there is no evidence of cellulitis. No fevers chills noted. Patient states in general he is not having any significant discomfort. Patient's lower extremity arterial duplex exam revealed that patient was hemodynamically stable with no evidence of stenosis in regard to the bilateral lower extremities. The lower extremity venous reflux exam revealed the patient had venous incontinence noted in the right greater saphenous and bilateral common femoral vein. There is no evidence of deep or superficial vein thrombosis in the bilateral lower extremities. Readmission: 11/12/18 Patient presents for evaluation our clinic today concerning issues that he is having with his left lower extremity. He tells me that a couple weeks ago he began developing blisters on the left lower extremity along with increased swelling. He typically wears his compression stockings on a regular basis is previously been evaluated both here as well is with vascular surgery they would recommend lymphedema pumps but unfortunately that somehow fell through and he never heard anything back from that. Nonetheless I think lymphedema pumps would be beneficial for this patient. He does have a history of hypertension and diabetes. Obviously the chronic venous stasis and lymphedema as well. At this point the blisters have been given in more trouble he states sometimes when the blisters openings able to clean it down with alcohol and it will dry out and do well. Unfortunately that has not been the case this time. He is having some discomfort although this mean these with cleaning the areas he doesn't have discomfort just on a regular basis. He  has not been able to wear his compression stockings since the blisters arose due to the fact that of course it will drain into the socks causing additional issues and he didn't have any way to wrap this otherwise. He has increased to taking his Lasix every day instead of every other day. He sees his primary care provider later this month as well. No fevers, chills, nausea, or vomiting noted at this time. 11/19/18-Patient returns at 1 week, per intake RN the amount of seepage into the compression wraps was definitely improved, overall all the wounds are measuring smaller but continuing silver alginate to the wounds as primary dressing 11/26/18 on evaluation today patient appears to be doing quite well in regard to his left lower [place] ulcers. In fact of the areas that were noted initially he only has two regions still open. There is no evidence of active infection at this time. He still is not heard anything from the company regarding lymphedema pumps as of yet. Again as previously seen vascular they have not recommended any surgical intervention. 12/03/2018 on evaluation today patient actually appears to be doing quite well with regard to his lower extremity ulcers. In fact most of the areas appear to be healed the one spot which does not seem to be completely healed I am unsure of whether or not this is really draining that much but nonetheless there does not appear to be any signs of infection or significant drainage at this point. There is no sign of fever, chills, nausea, vomiting, or diarrhea. Overall I am pleased with how things have progressed I think is very close to being able to transition  to his home compression stockings. [name], [name] ([id number]) 12/10/2018 upon evaluation today patient appears to be doing quite well with regard to his left lower extremity. He has been tolerating the dressing changes without complication. Fortunately there is no signs of active infection at this time. He  appears after thorough evaluation of his leg to only have 1 small area that remains open at this point everything else appears to be almost completely closed. He still have significant swelling of the left lower extremity. We had discussed discussing this with his primary care provider he is not able to see her in person they were at the [facility] and right now the [place] is not seeing patients on site. According to the patient anyway. Subsequently he did speak with her apparently and his primary care provider feels that he may likely have a DVT. With that being said she has not seen his leg she is just going off of his history. Nonetheless that is a concern that the patient now has as well and while I do not feel the DVT is likely we can definitely ensure that that is not the case I will go ahead and see about putting that order in today. Nonetheless otherwise I am in a recommend that we continue with the current wound care measures including the compression therapy most likely. We just need to ensure that his leg is indeed free of any DVTs. 12/17/2018 on evaluation today patient actually appears to be completely healed today. He does have 2 very small areas of blistering although this is not anything too significant at this point which is good news. With that being said I am in agreement with the fact that I think he is completely healed at this point. He does want to get back into his compression stocking. The good news is we have gotten approval from insurance for his lymphedema pumps we received a letter since last saw him last week. The other good news is his study did come back and showed no evidence of a DVT. 12/20/2018 on evaluation today patient presents for follow-up concerning his ongoing issues with his left lower extremity. He was actually discharged last Friday and did fairly well until he states blisters opened this morning. He tells me he has been wearing his compression stocking although  he has a hard time getting this on. There does not appear to be any signs of active infection at this time. No fevers, chills, nausea, vomiting, or diarrhea. 12/27/2018 on evaluation today patient appears to be doing very well with regard to his swelling of the left lower extremity the 4 layer compression wrap seems to have been beneficial for him. Fortunately there is no signs of active infection at this time. Patient has been tolerating the compression wrap without complication and his foot swelling in particular appears to be greatly improved. He does still have a wound on the lateral portion of his left leg I believe this is more of a blister that has now reopened. 01/03/2019 on evaluation today patient actually appears to be doing excellent in regard to his left lower extremity. He did receive his compression pumps and is actually use this 7 times since he was last here in the office. On top of the compression wrap he is now roughly 3 cm better at the calf and 2 cm better at the ankle he also states that his foot seem to go an issue better without even having to use a shoe horn. Obviously I  think this is all evidence that he is doing excellent in this regard. The other good news is he does not appear to have anything open today as far as wounds are concerned. 01/15/2019 on evaluation today patient appears to be doing more poorly yet again with regard to his left lower extremity. He has developed new wounds again after being discharged just recently. Unfortunately this continues to be the case that he will heal and then have subsequent new wounds. The last time I was hopeful that he may not end up coming back too quickly especially since he states he has been using his lymphedema pumps along with wearing his compression. Nonetheless he had a blister on the back of his leg that popped up on the left and this has opened up into an ulceration it is quite painful. 01/22/19 on evaluation today patient  actually appears to be doing well with regard to his wound on the left lower extremity. He's been tolerating the dressing changes without complication including the compression wrap in the wound appears to be significantly smaller today which is great news. Overall very pleased in this regard. 01/29/2019 on evaluation today patient appears to be doing well with regard to his left posterior lower extremity ulcer. He has been tolerating the dressing changes without complication. This is not completely healed but is getting much closer. We did order a Farrow wrap 4000 for him he has received this and has it with him today although I am not sure we are quite ready to start him on that as of yet. We are very close. 02/05/2019 on evaluation today patient actually appears to be doing quite well with regard to his left posterior lower extremity ulcer. He still has a very tiny opening remaining but the fortunate thing is he seems to be healing quite nicely. He also did get his Farrow wrap which I am hoping will help with his edema control as well at home. Fortunately there is no evidence of active infection. 02/12/2019 patient and fortunately appears to be doing poorly in regard to his wounds of the left lower extremity. He was very close to healing therefore we attempted to use his Velcro compression wraps continuing with lymphedema pumps at home. Unfortunately that does not seem to have done very well for him. He tells me that he wore them all the time but again I am not sure why if that is the case that he is having such significant edema. He is still on his fluid pills as well. With that being said there is no obvious sign of infection although I do wonder about the possibility of infection at this time as well. 02/19/2019 unfortunately upon evaluation today patient appears to be doing more poorly with regard to his left lower extremity. He is not showing signs of significant improvement and I think the  biggest issue here is that he does have an infection that appears to likely be Pseudomonas. That is based on the blue-green drainage that were noted at this time. Unfortunately the antibiotic that has been on is not going to take care of this at all. I think they will get a need to switch him to either Levaquin or Cipro and this was discussed with the patient. 02/26/2019 on evaluation today patient's lower extremity on the left appears to be doing significantly better as compared to last evaluation. Fortunately there is no signs of active infection at this time. He has been tolerating the compression wrap without complication in  fact he made it the whole week at this point. He is showing signs of excellent improvement I am very happy in this regard. With that being said he is having some issues with infection we did review the results of his culture which I noted today. He did have a positive finding for Enterobacter as well as Alcaligenes faecalis. Fortunately the Levaquin that I placed him on will work for both which is great news. There is no signs of systemic infection at this point. 10/30; left posterior leg wound in the setting of very significant edema and what looks like chronic venous inflammation. He has compression pumps but does not use them. We have been using 3 layer compression. Silver alginate to the wound as the primary dressing 03/18/2019 on evaluation today patient appears to be doing a little better compared to last time I saw him. He really has not been using his compression pumps he tells me that he is having too much discomfort. He has been keeping his wraps on however. He is only been taking his fluid pills every other day because he states they are not really helping and he has an appointment with his primary care provider at the [facility] tomorrow. Subsequently the wound itself on the left lower extremity does seem to be greatly improved compared to previous. 03/25/2019 on evaluation  today patient appears to be doing better with regard to his wounds on the bilateral lower extremities. The left is doing excellent the right is also doing better although both still do show some signs of open wounds noted at this point unfortunately. Fortunately there is no signs of active infection at this time. The patient also is not really having any significant pain which is good news. Unfortunately there was some confusion with the referral on vascular disease and as far as getting the patient scheduled there can be contacting him later today to do this [name], [name] ([id number]) fortunately we got this straightened out. 04/01/2019 on evaluation today patient appears to be doing no fevers, chills, nausea, vomiting, or diarrhea. Excellent at this time with regard to his lower extremities. There does not appear to be any open wound at this point which is good news. Fortunately is also no signs of active infection at this time. Overall feel like the patient has done excellent with the compression the problem is every time we got him to this point and then subsequently go to using his own compression things just go right back to where they were. I am not sure how to address this we can try to get an appointment with vascular for 2 weeks now they have yet to call him. Obviously this has become frustrating for the patient as well. I think the issue has just been an honest error as far as scheduling is concerned but nonetheless still worn out the point where I am unsure of which direction we should take. 04/08/2019 on evaluation today patient actually appears to be doing well with regard to his lower extremities. There are no open wounds at this time and things seem to be managing quite nicely as far as the overall edema control is concerned. With that being said he does have his compression socks today for [place] to go ahead and reinitiate therapy in that manner at this point. He is going to be going for  shoes to be measured on Wednesday and then coincidentally he will also be seeing vascular on Thursday. Overall I think this is good news and  again I am hopeful that they will be able to do something for him to help prevent ongoing issues with edema control as well. No fevers, chills, nausea, vomiting, or diarrhea. 04/11/2019 on evaluation today patient actually appears to be doing poorly after just being discharged on Monday of this week. He had been experiencing issues with again blisters especially on the left lower extremity. With that being said he was completely healed and appeared to be doing great this past Monday. He then subsequently has new blisters that formed before his appointment with vascular this morning. He was also measured for shoes in the interim. With that being said we may have figured out what exactly is going on and why he continues to have issues like what we are seeing at this point. He takes his compression stockings off at nighttime and then he ends up having to sleep in his chair for 5-6 hours a night. He sleeps with his feet down he cannot really get him up in the recliner and therefore he is sleeping and the worst possible his position with his feet on the floor for that majority of the time. Again as I explained to him that is about one third at minimum at least one fourth of his day that he spending with his feet dangling down on the ground and the worst possible position they could be. I think this may be what is causing the issue. Subsequently I am leaning toward thinking that he may need a hospital bed in order to elevate his legs. We likely can have to coordinate this with his primary care provider at the [facility]. Readmission: 01/26/2021 this is a patient who presents for repeat evaluation here in the clinic although it is actually been couple of years since have seen him in fact it was December 2020 when I last saw him. Subsequently he never really healed but  did end up being lost to follow-up. He tells me has been having issues ongoing with his lower extremities has bilateral lower extremity lymphedema no real significant or definitive open wounds but in general his lymphedema is way out of control. We were never able to refer him to lymphedema clinic simply due to the fact to be honest we were never able to get him completely healed. I do not see anyone with open wounds. The patient does have evidence of type 2 diabetes mellitus, lymphedema, chronic venous insufficiency, and hypertension. That really has not changed since his last evaluation. 02/09/2021 upon evaluation today patient appears to be doing a little better in regard to his legs although he still having a tremendous amount of drainage especially on the left leg. Fortunately there does not appear to be any evidence of active infection. Of note when we looked into this further it appears that the patient did not have any absorptive dressing on it was just the 4-layer compression wrap. Nonetheless this is probably big part of the issue here. 10/10; he comes in today with 3 large areas on the upper right lower leg likely remanence of denuded blistering under his compression wraps. He has no other wounds on the right. On the left he has the denuded area on the left medial foot and ankle and on the left dorsal foot. Massive lymphedema in both feet dorsally. Using Zetuvit under compression We have increased home health visitation to twice a week to change the dressings and will change it once 02/22/2021 upon evaluation today patient appears to be doing well currently  with regard to his wounds. He has been tolerating the dressing changes without complication. Fortunately there does not appear to be any evidence of active infection which is great news. No fevers, chills, nausea, vomiting, or diarrhea. The biggest issue I see currently is that home health is not putting any medicine on the actual wounds  before wrapping. 03/01/2021 upon evaluation today the patient's right leg actually appears to be doing quite well which is great news there does not appear to be any evidence of active infection at this time. No fevers, chills, nausea, vomiting, or diarrhea. With that being said the patient is having issues on the left foot where he is having significant drainage is also an ammonia smell he does not have any animals at home and this makes me concerned about a bacteria producing urea as a byproduct. Again the possible common organisms will be E. coli, Proteus, and Enterococcus. All 3 of which can be successfully treated with Levaquin. For that reason I think that this may be a good option for [place] to consider placing him on and I did obtain a culture as well for confirmation sake. 03/08/2021 upon evaluation today patient appears to be doing unfortunately still somewhat poorly in regard to his leg ulcerations. He actually has an area on the right leg where he blistered due to the fact that his wrap slid down and caused an area of pinching on his skin and this has led to a significant issue here. 03/15/2021 upon evaluation today patient unfortunately has not been wrapped appropriately with absorptive dressings nor with the appropriate technique for the third layer of the 4-layer compression wrap. These are issues that we continue to try to address with the home health nurse. Also the absorptive dressing that she had was cut in half and therefore that causes things to leak out it does not actually trap the fluid in regard to the top of the foot overall I think that all these combined are really not seeing things improved significantly here. Fortunately there does not appear to be any signs of significant infection at this time which is good news. He still is having a tremendous amount of drainage. 03/22/2021 upon evaluation today patient appears to be draining tremendously. He still continues to tell me  that he is using his pumps 2 times a day and that coupled with that tells me that he is elevating his legs as well. With that being said all things considered I am really just not seeing the improvement we would expect to see with the 4-layer compression wrap and all the above noted. He in fact had an extremely large Zetuvit dressing on both legs and that they were extremely filled to the max with fluid. This is after just being changed just before the weekend and this is Monday. Nonetheless I am concerned about the fact that there is something going on fairly significant that we cannot get any of this under control and that he is draining this significantly. He supposed be having an echocardiogram it sounds like scheduling has been an issue for him as far as getting in sooner. Its something to do with needing his cousin to drive him because of where it sat and he cannot drive himself to this appointment either way I really think he needs to try to see what he can do about making this happen a little sooner. He tells me he will call today. [name], [name] ([id number]) 03/29/2021 on evaluation today patient appears to be  doing about the same in regard to his legs. He did get his cardiology appointment moved up to 6 December which is at least good that is better than what it was before mid December. Overall very pleased in that regard. 04/05/2021 upon evaluation today patient unfortunately is still doing fairly poorly. There does not appear to be any signs of active infection at this time. No fevers, chills, nausea, vomiting, or diarrhea. Unfortunately I think until his edema is under control and overall fluid overload there is really not to be much chance that I can do much to get him better. This is quite unfortunate and frustrating both for myself and the patient to be perfectly honest. Nonetheless I think that he really needs to have a conversation both with his primary care provider as well as  cardiologist he sees the PCP on Monday and cardiology on Wednesday of next week. 04/13/2021 upon evaluation today patient appears to be doing poorly in regard to his bilateral lower extremities his left is still worse than the right. With that being said he has a tremendous amount of drainage he did see his primary care provider yesterday there really was not much there to be done from their perspective. He sees cardiology tomorrow. Nonetheless my biggest concern here is simply that if we do not get the edema under control he is going to continue to have drainage and honestly I think at some point he is going to become infected severely that is my main concern. 04/19/2021 upon evaluation today patient appears to be doing poorly still in regard to his legs. Unfortunately there does not appear to be any signs of infection at this point. He does have a tremendous amount of drainage however. We have not seen the results back from the cardiologist and the echocardiogram that was done. It appears that the patient checked out okay as far as that is concerned with regard to ejection fraction though we still have some issues here to be honest with his diastolic function. I am unsure if this is accounting for everything that we are seeing or not. Either way he has a tremendous amount of drainage from his legs that we are just not able to control in the outpatient setting at this point. I have reached out to Dr. Rockey Situ his cardiologist to see once he reviews the sheet if there is anything that he feels like can be done from an outpatient perspective if not then I think the way to go is probably can to be through inpatient admission and diuresis. Otherwise I am not sure how working to get this under control we tried antibiotics, compression wrapping, and I have told the patient to be elevating his legs I am not sure how much he does of this but either way I think that this is still an ongoing issue  nonetheless. 04/26/2021 upon evaluation today patient appears to be doing poorly in regard to his legs. He is having a tremendous amount of fluid at this point which is quite unfortunate. Its to the point that he may have had at least 5 to 10 pounds of fluid in his dressings this morning when they were removed these were changed this Friday. Subsequently I think he needs to go to the ER for further evaluation and treatment I think is probably can need diuresis possibly even IV antibiotics been on what the blood work looks like but in general I feel like he needs something to get this under control from  an outpatient perspective absent of everywhere I can think of and I cannot get this under control with our traditional measures. I think this is going require more so that we can get him better. Electronic Signature(s) Signed: 04/26/2021 8:47:51 AM By: Worthy Keeler PA-C Entered By: Worthy Keeler on 04/26/2021 08:47:51 [name] ([id number]) -------------------------------------------------------------------------------- Physical Exam Details Patient Name: [name] Date of Service: 04/26/2021 8:00 AM Medical Record Number: [id number] Patient Account Number: [id number] Date of Birth/Sex: 27-Jun-1950 (70 y.o. M) Treating RN: [name] Primary Care Provider: Felipa Eth Other Clinician: Referring Provider: Felipa Eth Treating Provider/Extender: Skipper Cliche in Treatment: 12 Constitutional Chronically ill appearing but in no apparent acute distress. Respiratory normal breathing without difficulty. Psychiatric this patient is able to make decisions and demonstrates good insight into disease process. Alert and Oriented x 3. pleasant and cooperative. Notes Upon inspection patient's wound bed actually showed signs of again being just more generalized weeping but significantly so throughout the bilateral lower extremities he has a huge blister on his left leg and  another 1 on the backside that was on the front this is tremendously significant and that he is literally dripping like a faucet from his legs this is becoming increasingly a problem as far as trying to keep him in any way shape or form dry I think he is much worse than his been I tried oral antibiotics with really no result whatsoever we been doing extreme compression wrapping to try to keep this in control is also not working. Electronic Signature(s) Signed: 04/26/2021 8:49:09 AM By: Worthy Keeler PA-C Entered By: Worthy Keeler on 04/26/2021 08:49:09 [name] ([id number]) -------------------------------------------------------------------------------- Physician Orders Details Patient Name: [name] Date of Service: 04/26/2021 8:00 AM Medical Record Number: [id number] Patient Account Number: [id number] Date of Birth/Sex: 06/17/1950 (70 y.o. M) Treating RN: [name] Primary Care Provider: Felipa Eth Other Clinician: Referring Provider: Felipa Eth Treating Provider/Extender: Skipper Cliche in Treatment: 12 Verbal / Phone Orders: No Diagnosis Coding ICD-10 Coding Code Description E11.622 Type 2 diabetes mellitus with other skin ulcer I89.0 Lymphedema, not elsewhere classified I87.2 Venous insufficiency (chronic) (peripheral) L97.822 Non-pressure chronic ulcer of other part of left lower leg with fat layer exposed L97.512 Non-pressure chronic ulcer of other part of right foot with fat layer exposed I10 Essential (primary) hypertension L97.811 Non-pressure chronic ulcer of other part of right lower leg limited to breakdown of skin Follow-up Appointments o Return Appointment in 1 week. [place] for wound care. May utilize formulary equivalent dressing for wound treatment orders unless otherwise specified. Home Health Nurse may visit PRN to address patientos wound care needs. - frequency 3 times per week - patient will be seen  once at wound center - home health to see patient 2 times per week on Wed and friday Bathing/ Shower/ Hygiene o May shower with wound dressing protected with water repellent cover or cast protector. Edema Control - Lymphedema / Segmental Compressive Device / Other Bilateral Lower Extremities o Optional: One layer of unna paste to top of compression wrap (to act as an anchor). - Unna paste on feet and calf to secure wrap in place o 4 Layer Compression System Lymphedema. - bi lat, apply zetuvits/ or formulary for extra absorbant dressing around ankles and foot area, 3 times per week COTTON LAYER SPIRAL, LIGHT TAN SPIRAL. WHITE WITH YELLOW LINE FIGURE 8 , COBAN SPIRAL o Elevate, Exercise Daily and Avoid Standing for Long Periods of Time. o  Elevate legs to the level of the heart and pump ankles as often as possible o Elevate leg(s) parallel to the floor when sitting. o Compression Pump: Use compression pump on left lower extremity for 60 minutes, twice daily. - Start today o Compression Pump: Use compression pump on right lower extremity for 60 minutes, twice daily. - Start today o DO YOUR BEST to sleep in the bed at night. DO NOT sleep in your recliner. Long hours of sitting in a recliner leads to swelling of the legs and/or potential wounds on your backside. o Other: - Contact prescriber regarding use of diuretics to reduce fluid overload. Off-Loading Bilateral Lower Extremities o Open toe surgical shoe Wound Treatment Wound #15 - Lower Leg Wound Laterality: Left, Medial Primary Dressing: Kerlix AMD Roll Dressing, 4.5x 4.1 (in/in) 3 x Per Week/30 Days Secondary Dressing: ABD Pad 5x9 (in/in) 3 x Per Week/30 Days Discharge Instructions: Cover with ABD pad Secured With: 37M Medipore H Soft Cloth Surgical Tape, 2x2 (in/yd) 3 x Per Week/30 Days Notes [name], [name] ([id number]) patient being sent to ED for eval Electronic Signature(s) Signed: 04/26/2021 8:57:00 AM By: [name] RN Signed: 04/26/2021 5:42:10 PM By: Worthy Keeler PA-C Entered By: [name] on 04/26/2021 08:57:00 [name] ([id number]) -------------------------------------------------------------------------------- Problem List Details Patient Name: [name] Date of Service: 04/26/2021 8:00 AM Medical Record Number: [id number] Patient Account Number: [id number] Date of Birth/Sex: 1951/03/02 (70 y.o. M) Treating RN: [name] Primary Care Provider: Felipa Eth Other Clinician: Referring Provider: Felipa Eth Treating Provider/Extender: Skipper Cliche in Treatment: 12 Active Problems ICD-10 Encounter Code Description Active Date MDM Diagnosis E11.622 Type 2 diabetes mellitus with other skin ulcer 01/26/2021 No Yes I89.0 Lymphedema, not elsewhere classified 01/26/2021 No Yes I87.2 Venous insufficiency (chronic) (peripheral) 01/26/2021 No Yes L97.822 Non-pressure chronic ulcer of other part of left lower leg with fat layer 01/26/2021 No Yes exposed L97.512 Non-pressure chronic ulcer of other part of right foot with fat layer 01/26/2021 No Yes exposed Pollock (primary) hypertension 01/26/2021 No Yes L97.811 Non-pressure chronic ulcer of other part of right lower leg limited to 02/15/2021 No Yes breakdown of skin Inactive Problems Resolved Problems Electronic Signature(s) Signed: 04/26/2021 8:30:42 AM By: Worthy Keeler PA-C Entered By: Worthy Keeler on 04/26/2021 08:30:41 [name] ([id number]) -------------------------------------------------------------------------------- Progress Note Details Patient Name: [name] Date of Service: 04/26/2021 8:00 AM Medical Record Number: [id number] Patient Account Number: [id number] Date of Birth/Sex: May 10, 1950 (70 y.o. M) Treating RN: [name] Primary Care Provider: Felipa Eth Other Clinician: Referring Provider: Felipa Eth Treating Provider/Extender: Skipper Cliche in Treatment:  12 Subjective Chief Complaint Information obtained from Patient Left LE ulcers History of Present Illness (HPI) 70 year old male who presented to the ER with bilateral lower extremity blisters which had started last week. he has a past medical history of leukemia, diabetes mellitus, hypertension, edema of both lower extremities, his recurrent skin infections, peripheral vascular disease, coronary artery disease, congestive heart failure and peripheral neuropathy. in the ER he was given Rocephin and put on Silvadene cream. he was put on oral doxycycline and was asked to follow-up with the [facility]. His last hemoglobin A1c was 6.6 in December and he checks his blood sugar once a week. He does not have any physicians outside the [place] system. He does not recall any vascular duplex studies done either for arterial or venous disease but was told to wear compression stockings which he does not use 05/30/2016 -- we have not yet received any  of his notes from the [place] hospital system and his arterial and venous duplex studies are scheduled here in [place] around mid February. We are unable to have his insurance accepted by home health agencies and hence he is getting dressings only once a week. 06/06/16 -- -- I received a call from the patient's PCP at the [facility] at [facility] and spoke to Dr. Garvin Fila, phone number [phone] and fax number [phone]. She confirmed that no vascular testing was done over the last 5 years and she would be happy to do them if the patient did want them to be done at the [place] and we could fax him a request. Readmission: 70 year old male seen by as in February of this year and was referred to vein and vascular for studies and opinion from the vascular surgeons. The patient returns today with a fresh problem having had blisters on his left lower extremity which have been there for about 5 days and he clearly states that he has been wearing his compression stockings as  advised though he could not read the moderate compression and has been wearing light compression. Review of his electronic medical records note that he had lower extremity arterial duplex examination done on 06/23/2016 which showed no hemodynamically significant stenosis in the bilateral lower extremity arterial system. He also had a lower extremity venous reflux examination done on 07/07/2016 and it was noted that he had venous incompetence in the right great saphenous vein and bilateral common femoral veins. Patient was seen by Dr. Tamala Julian on the same day and for some reason his notes do not reflect the venous studies or the arterial studies and he recommended patient do a venous duplex ultrasound to look for reflux and return to see him.he would also consider a lymph pump if required. The patient was told that his workup was normal and hence the patient canceled his follow-up appointment. 02/03/17 on evaluation today patient left medial lower extremity blister appears to be doing about the same. It is still continuing to drain and there's still the blistered skin covering the wound bed which is making it difficult for the alternate to do its job. Fortunately there is no evidence of cellulitis. No fevers chills noted. Patient states in general he is not having any significant discomfort. Patient's lower extremity arterial duplex exam revealed that patient was hemodynamically stable with no evidence of stenosis in regard to the bilateral lower extremities. The lower extremity venous reflux exam revealed the patient had venous incontinence noted in the right greater saphenous and bilateral common femoral vein. There is no evidence of deep or superficial vein thrombosis in the bilateral lower extremities. Readmission: 11/12/18 Patient presents for evaluation our clinic today concerning issues that he is having with his left lower extremity. He tells me that a couple weeks ago he began developing blisters  on the left lower extremity along with increased swelling. He typically wears his compression stockings on a regular basis is previously been evaluated both here as well is with vascular surgery they would recommend lymphedema pumps but unfortunately that somehow fell through and he never heard anything back from that. Nonetheless I think lymphedema pumps would be beneficial for this patient. He does have a history of hypertension and diabetes. Obviously the chronic venous stasis and lymphedema as well. At this point the blisters have been given in more trouble he states sometimes when the blisters openings able to clean it down with alcohol and it will dry out and do well. Unfortunately  that has not been the case this time. He is having some discomfort although this mean these with cleaning the areas he doesn't have discomfort just on a regular basis. He has not been able to wear his compression stockings since the blisters arose due to the fact that of course it will drain into the socks causing additional issues and he didn't have any way to wrap this otherwise. He has increased to taking his Lasix every day instead of every other day. He sees his primary care provider later this month as well. No fevers, chills, nausea, or vomiting noted at this time. 11/19/18-Patient returns at 1 week, per intake RN the amount of seepage into the compression wraps was definitely improved, overall all the wounds are measuring smaller but continuing silver alginate to the wounds as primary dressing 11/26/18 on evaluation today patient appears to be doing quite well in regard to his left lower [place] ulcers. In fact of the areas that were noted initially he only has two regions still open. There is no evidence of active infection at this time. He still is not heard anything from the company regarding lymphedema pumps as of yet. Again as previously seen vascular they have not recommended any surgical  intervention. NIRVAAN, FRETT ([id number]) 12/03/2018 on evaluation today patient actually appears to be doing quite well with regard to his lower extremity ulcers. In fact most of the areas appear to be healed the one spot which does not seem to be completely healed I am unsure of whether or not this is really draining that much but nonetheless there does not appear to be any signs of infection or significant drainage at this point. There is no sign of fever, chills, nausea, vomiting, or diarrhea. Overall I am pleased with how things have progressed I think is very close to being able to transition to his home compression stockings. 12/10/2018 upon evaluation today patient appears to be doing quite well with regard to his left lower extremity. He has been tolerating the dressing changes without complication. Fortunately there is no signs of active infection at this time. He appears after thorough evaluation of his leg to only have 1 small area that remains open at this point everything else appears to be almost completely closed. He still have significant swelling of the left lower extremity. We had discussed discussing this with his primary care provider he is not able to see her in person they were at the [facility] and right now the [place] is not seeing patients on site. According to the patient anyway. Subsequently he did speak with her apparently and his primary care provider feels that he may likely have a DVT. With that being said she has not seen his leg she is just going off of his history. Nonetheless that is a concern that the patient now has as well and while I do not feel the DVT is likely we can definitely ensure that that is not the case I will go ahead and see about putting that order in today. Nonetheless otherwise I am in a recommend that we continue with the current wound care measures including the compression therapy most likely. We just need to ensure that his leg is indeed free of any  DVTs. 12/17/2018 on evaluation today patient actually appears to be completely healed today. He does have 2 very small areas of blistering although this is not anything too significant at this point which is good news. With that being said I am  in agreement with the fact that I think he is completely healed at this point. He does want to get back into his compression stocking. The good news is we have gotten approval from insurance for his lymphedema pumps we received a letter since last saw him last week. The other good news is his study did come back and showed no evidence of a DVT. 12/20/2018 on evaluation today patient presents for follow-up concerning his ongoing issues with his left lower extremity. He was actually discharged last Friday and did fairly well until he states blisters opened this morning. He tells me he has been wearing his compression stocking although he has a hard time getting this on. There does not appear to be any signs of active infection at this time. No fevers, chills, nausea, vomiting, or diarrhea. 12/27/2018 on evaluation today patient appears to be doing very well with regard to his swelling of the left lower extremity the 4 layer compression wrap seems to have been beneficial for him. Fortunately there is no signs of active infection at this time. Patient has been tolerating the compression wrap without complication and his foot swelling in particular appears to be greatly improved. He does still have a wound on the lateral portion of his left leg I believe this is more of a blister that has now reopened. 01/03/2019 on evaluation today patient actually appears to be doing excellent in regard to his left lower extremity. He did receive his compression pumps and is actually use this 7 times since he was last here in the office. On top of the compression wrap he is now roughly 3 cm better at the calf and 2 cm better at the ankle he also states that his foot seem to go an  issue better without even having to use a shoe horn. Obviously I think this is all evidence that he is doing excellent in this regard. The other good news is he does not appear to have anything open today as far as wounds are concerned. 01/15/2019 on evaluation today patient appears to be doing more poorly yet again with regard to his left lower extremity. He has developed new wounds again after being discharged just recently. Unfortunately this continues to be the case that he will heal and then have subsequent new wounds. The last time I was hopeful that he may not end up coming back too quickly especially since he states he has been using his lymphedema pumps along with wearing his compression. Nonetheless he had a blister on the back of his leg that popped up on the left and this has opened up into an ulceration it is quite painful. 01/22/19 on evaluation today patient actually appears to be doing well with regard to his wound on the left lower extremity. He's been tolerating the dressing changes without complication including the compression wrap in the wound appears to be significantly smaller today which is great news. Overall very pleased in this regard. 01/29/2019 on evaluation today patient appears to be doing well with regard to his left posterior lower extremity ulcer. He has been tolerating the dressing changes without complication. This is not completely healed but is getting much closer. We did order a Farrow wrap 4000 for him he has received this and has it with him today although I am not sure we are quite ready to start him on that as of yet. We are very close. 02/05/2019 on evaluation today patient actually appears to be doing quite well with  regard to his left posterior lower extremity ulcer. He still has a very tiny opening remaining but the fortunate thing is he seems to be healing quite nicely. He also did get his Farrow wrap which I am hoping will help with his edema control as  well at home. Fortunately there is no evidence of active infection. 02/12/2019 patient and fortunately appears to be doing poorly in regard to his wounds of the left lower extremity. He was very close to healing therefore we attempted to use his Velcro compression wraps continuing with lymphedema pumps at home. Unfortunately that does not seem to have done very well for him. He tells me that he wore them all the time but again I am not sure why if that is the case that he is having such significant edema. He is still on his fluid pills as well. With that being said there is no obvious sign of infection although I do wonder about the possibility of infection at this time as well. 02/19/2019 unfortunately upon evaluation today patient appears to be doing more poorly with regard to his left lower extremity. He is not showing signs of significant improvement and I think the biggest issue here is that he does have an infection that appears to likely be Pseudomonas. That is based on the blue-green drainage that were noted at this time. Unfortunately the antibiotic that has been on is not going to take care of this at all. I think they will get a need to switch him to either Levaquin or Cipro and this was discussed with the patient. 02/26/2019 on evaluation today patient's lower extremity on the left appears to be doing significantly better as compared to last evaluation. Fortunately there is no signs of active infection at this time. He has been tolerating the compression wrap without complication in fact he made it the whole week at this point. He is showing signs of excellent improvement I am very happy in this regard. With that being said he is having some issues with infection we did review the results of his culture which I noted today. He did have a positive finding for Enterobacter as well as Alcaligenes faecalis. Fortunately the Levaquin that I placed him on will work for both which is great news.  There is no signs of systemic infection at this point. 10/30; left posterior leg wound in the setting of very significant edema and what looks like chronic venous inflammation. He has compression pumps but does not use them. We have been using 3 layer compression. Silver alginate to the wound as the primary dressing 03/18/2019 on evaluation today patient appears to be doing a little better compared to last time I saw him. He really has not been using his compression pumps he tells me that he is having too much discomfort. He has been keeping his wraps on however. He is only been taking his fluid pills every other day because he states they are not really helping and he has an appointment with his primary care provider at the [facility] tomorrow. Subsequently the wound itself on the left lower extremity does seem to be greatly improved compared to previous. [name], [name] ([id number]) 03/25/2019 on evaluation today patient appears to be doing better with regard to his wounds on the bilateral lower extremities. The left is doing excellent the right is also doing better although both still do show some signs of open wounds noted at this point unfortunately. Fortunately there is no signs of active infection at  this time. The patient also is not really having any significant pain which is good news. Unfortunately there was some confusion with the referral on vascular disease and as far as getting the patient scheduled there can be contacting him later today to do this fortunately we got this straightened out. 04/01/2019 on evaluation today patient appears to be doing no fevers, chills, nausea, vomiting, or diarrhea. Excellent at this time with regard to his lower extremities. There does not appear to be any open wound at this point which is good news. Fortunately is also no signs of active infection at this time. Overall feel like the patient has done excellent with the compression the problem is every time we got  him to this point and then subsequently go to using his own compression things just go right back to where they were. I am not sure how to address this we can try to get an appointment with vascular for 2 weeks now they have yet to call him. Obviously this has become frustrating for the patient as well. I think the issue has just been an honest error as far as scheduling is concerned but nonetheless still worn out the point where I am unsure of which direction we should take. 04/08/2019 on evaluation today patient actually appears to be doing well with regard to his lower extremities. There are no open wounds at this time and things seem to be managing quite nicely as far as the overall edema control is concerned. With that being said he does have his compression socks today for [place] to go ahead and reinitiate therapy in that manner at this point. He is going to be going for shoes to be measured on Wednesday and then coincidentally he will also be seeing vascular on Thursday. Overall I think this is good news and again I am hopeful that they will be able to do something for him to help prevent ongoing issues with edema control as well. No fevers, chills, nausea, vomiting, or diarrhea. 04/11/2019 on evaluation today patient actually appears to be doing poorly after just being discharged on Monday of this week. He had been experiencing issues with again blisters especially on the left lower extremity. With that being said he was completely healed and appeared to be doing great this past Monday. He then subsequently has new blisters that formed before his appointment with vascular this morning. He was also measured for shoes in the interim. With that being said we may have figured out what exactly is going on and why he continues to have issues like what we are seeing at this point. He takes his compression stockings off at nighttime and then he ends up having to sleep in his chair for 5-6 hours a night.  He sleeps with his feet down he cannot really get him up in the recliner and therefore he is sleeping and the worst possible his position with his feet on the floor for that majority of the time. Again as I explained to him that is about one third at minimum at least one fourth of his day that he spending with his feet dangling down on the ground and the worst possible position they could be. I think this may be what is causing the issue. Subsequently I am leaning toward thinking that he may need a hospital bed in order to elevate his legs. We likely can have to coordinate this with his primary care provider at the [facility]. Readmission: 01/26/2021 this is  a patient who presents for repeat evaluation here in the clinic although it is actually been couple of years since have seen him in fact it was December 2020 when I last saw him. Subsequently he never really healed but did end up being lost to follow-up. He tells me has been having issues ongoing with his lower extremities has bilateral lower extremity lymphedema no real significant or definitive open wounds but in general his lymphedema is way out of control. We were never able to refer him to lymphedema clinic simply due to the fact to be honest we were never able to get him completely healed. I do not see anyone with open wounds. The patient does have evidence of type 2 diabetes mellitus, lymphedema, chronic venous insufficiency, and hypertension. That really has not changed since his last evaluation. 02/09/2021 upon evaluation today patient appears to be doing a little better in regard to his legs although he still having a tremendous amount of drainage especially on the left leg. Fortunately there does not appear to be any evidence of active infection. Of note when we looked into this further it appears that the patient did not have any absorptive dressing on it was just the 4-layer compression wrap. Nonetheless this is probably big part of  the issue here. 10/10; he comes in today with 3 large areas on the upper right lower leg likely remanence of denuded blistering under his compression wraps. He has no other wounds on the right. On the left he has the denuded area on the left medial foot and ankle and on the left dorsal foot. Massive lymphedema in both feet dorsally. Using Zetuvit under compression We have increased home health visitation to twice a week to change the dressings and will change it once 02/22/2021 upon evaluation today patient appears to be doing well currently with regard to his wounds. He has been tolerating the dressing changes without complication. Fortunately there does not appear to be any evidence of active infection which is great news. No fevers, chills, nausea, vomiting, or diarrhea. The biggest issue I see currently is that home health is not putting any medicine on the actual wounds before wrapping. 03/01/2021 upon evaluation today the patient's right leg actually appears to be doing quite well which is great news there does not appear to be any evidence of active infection at this time. No fevers, chills, nausea, vomiting, or diarrhea. With that being said the patient is having issues on the left foot where he is having significant drainage is also an ammonia smell he does not have any animals at home and this makes me concerned about a bacteria producing urea as a byproduct. Again the possible common organisms will be E. coli, Proteus, and Enterococcus. All 3 of which can be successfully treated with Levaquin. For that reason I think that this may be a good option for [place] to consider placing him on and I did obtain a culture as well for confirmation sake. 03/08/2021 upon evaluation today patient appears to be doing unfortunately still somewhat poorly in regard to his leg ulcerations. He actually has an area on the right leg where he blistered due to the fact that his wrap slid down and caused an area of  pinching on his skin and this has led to a significant issue here. 03/15/2021 upon evaluation today patient unfortunately has not been wrapped appropriately with absorptive dressings nor with the appropriate technique for the third layer of the 4-layer compression wrap. These are issues  that we continue to try to address with the home health nurse. Also the absorptive dressing that she had was cut in half and therefore that causes things to leak out it does not actually trap the fluid in regard to the top of the foot overall I think that all these combined are really not seeing things improved significantly here. Fortunately there does not appear to be any signs of significant infection at this time which is good news. He still is having a tremendous amount of drainage. 03/22/2021 upon evaluation today patient appears to be draining tremendously. He still continues to tell me that he is using his pumps 2 times a day and that coupled with that tells me that he is elevating his legs as well. With that being said all things considered I am really just not seeing the improvement we would expect to see with the 4-layer compression wrap and all the above noted. He in fact had an extremely large Zetuvit dressing on both legs and that they were extremely filled to the max with fluid. This is after just being changed just before the weekend and this [name], [name] ([id number]) is Monday. Nonetheless I am concerned about the fact that there is something going on fairly significant that we cannot get any of this under control and that he is draining this significantly. He supposed be having an echocardiogram it sounds like scheduling has been an issue for him as far as getting in sooner. Its something to do with needing his cousin to drive him because of where it sat and he cannot drive himself to this appointment either way I really think he needs to try to see what he can do about making this happen a little  sooner. He tells me he will call today. 03/29/2021 on evaluation today patient appears to be doing about the same in regard to his legs. He did get his cardiology appointment moved up to 6 December which is at least good that is better than what it was before mid December. Overall very pleased in that regard. 04/05/2021 upon evaluation today patient unfortunately is still doing fairly poorly. There does not appear to be any signs of active infection at this time. No fevers, chills, nausea, vomiting, or diarrhea. Unfortunately I think until his edema is under control and overall fluid overload there is really not to be much chance that I can do much to get him better. This is quite unfortunate and frustrating both for myself and the patient to be perfectly honest. Nonetheless I think that he really needs to have a conversation both with his primary care provider as well as cardiologist he sees the PCP on Monday and cardiology on Wednesday of next week. 04/13/2021 upon evaluation today patient appears to be doing poorly in regard to his bilateral lower extremities his left is still worse than the right. With that being said he has a tremendous amount of drainage he did see his primary care provider yesterday there really was not much there to be done from their perspective. He sees cardiology tomorrow. Nonetheless my biggest concern here is simply that if we do not get the edema under control he is going to continue to have drainage and honestly I think at some point he is going to become infected severely that is my main concern. 04/19/2021 upon evaluation today patient appears to be doing poorly still in regard to his legs. Unfortunately there does not appear to be any signs of infection at  this point. He does have a tremendous amount of drainage however. We have not seen the results back from the cardiologist and the echocardiogram that was done. It appears that the patient checked out okay as far as  that is concerned with regard to ejection fraction though we still have some issues here to be honest with his diastolic function. I am unsure if this is accounting for everything that we are seeing or not. Either way he has a tremendous amount of drainage from his legs that we are just not able to control in the outpatient setting at this point. I have reached out to Dr. Rockey Situ his cardiologist to see once he reviews the sheet if there is anything that he feels like can be done from an outpatient perspective if not then I think the way to go is probably can to be through inpatient admission and diuresis. Otherwise I am not sure how working to get this under control we tried antibiotics, compression wrapping, and I have told the patient to be elevating his legs I am not sure how much he does of this but either way I think that this is still an ongoing issue nonetheless. 04/26/2021 upon evaluation today patient appears to be doing poorly in regard to his legs. He is having a tremendous amount of fluid at this point which is quite unfortunate. Its to the point that he may have had at least 5 to 10 pounds of fluid in his dressings this morning when they were removed these were changed this Friday. Subsequently I think he needs to go to the ER for further evaluation and treatment I think is probably can need diuresis possibly even IV antibiotics been on what the blood work looks like but in general I feel like he needs something to get this under control from an outpatient perspective absent of everywhere I can think of and I cannot get this under control with our traditional measures. I think this is going require more so that we can get him better. Objective Constitutional Chronically ill appearing but in no apparent acute distress. Vitals Time Taken: 8:22 AM, Height: 73 in, Weight: 312 lbs, BMI: 41.2, Temperature: 97.9 F, Pulse: 90 bpm, Respiratory Rate: 18 breaths/min, Blood Pressure: 165/87  mmHg. Respiratory normal breathing without difficulty. Psychiatric this patient is able to make decisions and demonstrates good insight into disease process. Alert and Oriented x 3. pleasant and cooperative. General Notes: Upon inspection patient's wound bed actually showed signs of again being just more generalized weeping but significantly so throughout the bilateral lower extremities he has a huge blister on his left leg and another 1 on the backside that was on the front this is tremendously significant and that he is literally dripping like a faucet from his legs this is becoming increasingly a problem as far as trying to keep him in any way shape or form dry I think he is much worse than his been I tried oral antibiotics with really no result whatsoever we been doing extreme compression wrapping to try to keep this in control is also not working. Integumentary (Hair, Skin) Wound #15 status is Open. Original cause of wound was Blister. The date acquired was: 03/08/2021. The wound has been in treatment 7 weeks. The wound is located on the Left,Medial Lower Leg. The wound measures 16cm length x 27cm width x 0.1cm depth; 339.292cm^2 area and 33.929cm^3 volume. There is Fat Layer (Subcutaneous Tissue) exposed. There is no tunneling or undermining noted. There is  a large amount of serous drainage noted. There is medium (34-66%) pink granulation within the wound bed. There is a medium (34-66%) amount of necrotic tissue within the wound bed including Adherent Slough. Wound #17 status is Open. Original cause of wound was Blister. The date acquired was: 04/24/2021. The wound is located on the Left,Anterior Lower Leg. The wound measures 5cm length x 9.5cm width x 0.1cm depth; 37.306cm^2 area and 3.731cm^3 volume. There is Fat Layer (Subcutaneous Tissue) exposed. There is no tunneling or undermining noted. There is a large amount of serous drainage noted. There is medium [name], [name]  ([id number]) (34-66%) red granulation within the wound bed. There is a medium (34-66%) amount of necrotic tissue within the wound bed including Adherent Slough. 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 Would recommend currently that we go ahead and send the patient for evaluation in the ER. The patient is in agreement with plan. I think this is going to be necessary in order to get the swelling under control and the edema under control. Right now he has been draining to the point that I do not even think we be able to do anything at this point from an outpatient perspective to get this better we have attempted everything we can including 4-layer compression wraps, elevation, and changing them 3 times a week and even at this seems to just be getting worse and worse not better. 2. I am also can recommend that he follow-up with [place] once he is discharged from the ER. Obviously right now I think the biggest thing is he probably needs to be admitted where he can be diuresed as well as potentially get an infection under control if he is indeed infected right now as well. We will see patient back for reevaluation in 1 week here in the clinic. If anything worsens or changes patient will contact our office for additional recommendations. Electronic Signature(s) Signed: 04/26/2021 8:50:36 AM By: Worthy Keeler PA-C Entered By: Worthy Keeler on 04/26/2021 08:50:36 [name] ([id number]) -------------------------------------------------------------------------------- SuperBill Details Patient  Name: [name] Date of Service: 04/26/2021 Medical Record Number: [id number] Patient Account Number: [id number] Date of Birth/Sex: 1950-07-16 (70 y.o. M) Treating RN: [name] Primary Care Provider: Felipa Eth Other Clinician: Referring Provider: Felipa Eth Treating Provider/Extender: Skipper Cliche in Treatment: 12 Diagnosis Coding ICD-10 Codes Code Description E11.622 Type 2 diabetes mellitus with other skin ulcer I89.0 Lymphedema, not elsewhere classified I87.2 Venous insufficiency (chronic) (peripheral) L97.822 Non-pressure chronic ulcer of other part of left lower leg with fat layer exposed L97.512 Non-pressure chronic ulcer of other part of right foot with fat layer exposed I10 Essential (primary) hypertension L97.811 Non-pressure chronic ulcer of other part of right lower leg limited to breakdown of skin Facility Procedures CPT4 Code: 68032122 Description: 99214 - WOUND CARE VISIT-LEV 4 EST PT Modifier: Quantity: 1 Physician Procedures CPT4 Code: 4825003 Description: 99214 - WC PHYS LEVEL 4 - EST PT Modifier: Quantity: 1 CPT4 Code: Description: ICD-10 Diagnosis Description E11.622 Type 2 diabetes mellitus with other skin ulcer I89.0 Lymphedema, not elsewhere classified I87.2 Venous insufficiency (chronic) (peripheral) L97.822 Non-pressure chronic ulcer of other part of left  lower  leg with fat layer Modifier: exposed Quantity: Electronic Signature(s) Signed: 04/26/2021 8:58:39 AM By: [name] RN Signed: 04/26/2021 5:42:10 PM By: Worthy Keeler PA-C Previous Signature: 04/26/2021 8:51:03 AM Version By: Worthy Keeler PA-C Entered By: [name] on 04/26/2021 08:58:38

## 2021-04-26 NOTE — ED Provider Notes (Signed)
Emergency Medicine Provider Triage Evaluation Note  [name] , a 70 y.o. male  was evaluated in triage.  Pt complains of swollen feet, odor to wounds, swollen legs.  Sent here from the wound care center for IV antibiotics, diuresis, and possible admission.  Review of Systems  Positive: Swelling of legs and feet, odor noted from wounds Negative: Fever, chills, chest pain or shortness of breath  Physical Exam  BP (!) 134/52    Pulse 83    Temp (!) 97.5 F (36.4 C) (Oral)    Resp 20    Ht 6\' 1"  (1.854 m)    Wt (!) 138.3 kg    SpO2 94%    BMI 40.24 kg/m  Gen:   Awake, no distress   Resp:  Normal effort  MSK:   Swelling noted to lower extremities, odor noted from wound Other:    Medical Decision Making  Medically screening exam initiated at 11:38 AM.  Appropriate orders placed.  [name] was informed that the remainder of the evaluation will be completed by another provider, this initial triage assessment does not replace that evaluation, and the importance of remaining in the ED until their evaluation is complete.  The patient gets most of his care at the [place].  However he was seen here from the wound center.  History of having a PICC line x2.  Vein and vascular had seen him back to the wound center and he has been discharged from their care.   Versie Starks, PA-C 04/26/21 1141    Rada Hay, MD 04/26/21 [phone]

## 2021-04-26 NOTE — ED Notes (Signed)
[place], lab contacted to collect

## 2021-04-26 NOTE — Consult Note (Signed)
Pharmacy Antibiotic Note  [name] is a 70 y.o. male admitted on 04/26/2021 with  wound infection .  Pharmacy has been consulted for Vancomycin dosing.  Plan: Vancomycin 2500mg  x 1 (loading dose, then Vancomycin 1750 mg IV Q 24 hrs Goal AUC 400-550. Expected AUC: 468 SCr used: 1.30  Height: 6\' 1"  (185.4 cm) Weight: (!) 138.3 kg (305 lb) IBW/kg (Calculated) : 79.9  Temp (24hrs), Avg:97.5 F (36.4 C), Min:97.5 F (36.4 C), Max:97.5 F (36.4 C)  Recent Labs  Lab 04/26/21 1132 04/26/21 1349  WBC 6.1  --   CREATININE 1.30*  --   LATICACIDVEN 1.4 1.4    Estimated Creatinine Clearance: 77.3 mL/min (A) (by C-G formula based on SCr of 1.3 mg/dL (H)).    No Known Allergies  Antimicrobials this admission: Ceftriaxone 12/19>>  Vancomycin 12/19 >>   Dose adjustments this admission: none  Microbiology results: 12/19 BCx: ordered  Thank you for allowing pharmacy to be a part of this patients care.  Neaveh Belanger Rodriguez-Guzman PharmD, BCPS 04/26/2021 3:50 PM

## 2021-04-26 NOTE — H&P (Signed)
History and Physical    [name] UTM:[id number] DOB: 01/06/1951 DOA: 04/26/2021  Referring MD/NP/PA:   PCP: Felipa Eth, MD   Patient coming from:  The patient is coming from home.  At baseline, pt is independent for most of ADL.        Chief Complaint: leg wounds  HPI: [name] is a 70 y.o. male with medical history significant of PVD, hypertension, hyperlipidemia, diabetes mellitus, dCHF, CKD stage IIIa, lymphedema, venous insufficiency, morbid obesity with  BMI 40.42, former smoker, iron deficiency anemia, who presents with leg wounds.  Pt states that he has chronic wounds in both legs for more than 2 years. He has been following regularly with wound care. He and has had a couple rounds of antibiotics, most recently in July. His leg wound and swelling have been progressively worsening.  He has noticed foul-smelling drainage from his open wounds. Patient does not have significant pain, no fever or chills. Patient denies chest pain, cough, shortness of breath.  No nausea vomiting, diarrhea or abdominal pain no symptoms of UTI. He followed up with his wound care provider earlier today, who recommended he come to the ED for IV diuresis as well as antibiotics.    ED Course: pt was found to have WBC 6.0, lactic acid 1.4, 1.4, pending COVID-19 PCR, stable renal function, no fever, blood pressure 132/74, heart rate 83, RR 25, oxygen saturation 94% on room air.  Patient is admitted to [facility] bed as inpatient  Review of Systems:   General: no fevers, chills, no body weight gain, has poor appetite, has fatigue HEENT: no blurry vision, hearing changes or sore throat Respiratory: no dyspnea, coughing, wheezing CV: no chest pain, no palpitations GI: no nausea, vomiting, abdominal pain, diarrhea, constipation GU: no dysuria, burning on urination, increased urinary frequency, hematuria  Ext: no leg edema Neuro: no unilateral weakness, numbness, or tingling, no vision change or hearing  loss Skin: no rash, no skin tear. MSK: No muscle spasm, no deformity, no limitation of range of movement in spin Heme: No easy bruising.  Travel history: No recent long distant travel.  Allergy: No Known Allergies  Past Medical History:  Diagnosis Date   Diabetes mellitus without complication ([place])    Hyperlipidemia    Hypertension    Leukemia ([place])     Past Surgical History:  Procedure Laterality Date   CATARACT EXTRACTION Right    TONSILLECTOMY     TOOTH EXTRACTION      Social History:  reports that he has quit smoking. He has never used smokeless tobacco. He reports that he does not drink alcohol and does not use drugs.  Family History:  Family History  Problem Relation Age of Onset   Cancer Mother    Stroke Father    Cancer Sister      Prior to Admission medications   Medication Sig Start Date End Date Taking? Authorizing Provider  aspirin 81 MG chewable tablet Chew 81 mg by mouth daily.   Yes [provider]  Cholecalciferol 25 MCG (1000 UT) tablet Take 2 tablets by mouth daily. 09/11/14  Yes [provider]  ferrous sulfate 325 (65 FE) MG EC tablet Take 325 mg by mouth daily.   Yes [provider]  glimepiride (AMARYL) 1 MG tablet Take 1 tablet by mouth daily. 02/23/21  Yes [provider]  hydrALAZINE (APRESOLINE) 25 MG tablet Take 75 mg by mouth in the morning, at noon, and at bedtime. 04/12/21  Yes [provider]  lisinopril (ZESTRIL) 40 MG tablet TAKE ONE TABLET BY MOUTH EVERY DAY FOR BLOOD PRESSURE **REPLACES LISINOPRIL/HCTZ 04/13/20  Yes [provider]  Multiple Vitamin (MULTIVITAMIN WITH MINERALS) TABS tablet Take 1 tablet by mouth daily. 12/04/20  Yes Lorella Nimrod, MD  nystatin (MYCOSTATIN/NYSTOP) powder Apply to toe crevices with unna wrap changes 07/31/20  Yes Kris Hartmann, NP  Omega-3 Fatty Acids (FISH OIL) 1000 MG CAPS Take 1 capsule by mouth daily.   Yes [provider]  pioglitazone (ACTOS)  15 MG tablet Take 15 mg by mouth daily.   Yes [provider]  pravastatin (PRAVACHOL) 80 MG tablet Take 40 mg by mouth daily. daily   Yes [provider]  sertraline (ZOLOFT) 100 MG tablet Take 100 mg by mouth daily.    Yes [provider]  silver sulfADIAZINE (SILVADENE) 1 % cream Apply to left ankle with unna wrap changes 07/31/20  Yes Kris Hartmann, NP  bisoprolol (ZEBETA) 5 MG tablet Take 1 tablet by mouth daily. Patient not taking: Reported on 04/26/2021 06/15/20   [provider]  Ensure Max Protein (ENSURE MAX PROTEIN) LIQD Take 330 mLs (11 oz total) by mouth 2 (two) times daily. 12/04/20   Lorella Nimrod, MD  furosemide (LASIX) 20 MG tablet Take 1 tablet (20 mg total) by mouth daily. Patient not taking: Reported on 01/14/2021 12/04/20 12/04/21  Lorella Nimrod, MD  hydrALAZINE (APRESOLINE) 10 MG tablet Take 10 mg by mouth 3 (three) times daily. Patient not taking: Reported on 04/26/2021    [provider]  metFORMIN (GLUCOPHAGE) 1000 MG tablet Take 1,000 mg by mouth 2 (two) times daily with a meal. Patient not taking: Reported on 01/14/2021    [provider]    Physical Exam: Vitals:   04/26/21 1132 04/26/21 1133 04/26/21 1400 04/26/21 1430  BP:  (!) 134/52 136/75 132/74  Pulse: 83  69 67  Resp: 20  (!) 22 (!) 25  Temp: (!) 97.5 F (36.4 C)     TempSrc: Oral     SpO2: 94%  100% 99%  Weight:  (!) 138.3 kg    Height:  6' 1" (1.854 m)     General: Not in acute distress HEENT:       Eyes: PERRL, EOMI, no scleral icterus.       ENT: No discharge from the ears and nose, no pharynx injection, no tonsillar enlargement.        Neck: No JVD, no bruit, no mass felt. Heme: No neck lymph node enlargement. Cardiac: S1/S2, RRR, No murmurs, No gallops or rubs. Respiratory: No rales, wheezing, rhonchi or rubs. GI: Soft, nondistended, nontender, no rebound pain, no organomegaly, BS present. GU: No hematuria Ext: has chronic venous insufficiency  change in both legs, has chronic erythema and swelling in both legs,, has open wound in left lower leg.  No significant tenderness.    Musculoskeletal: No joint deformities, No joint redness or warmth, no limitation of ROM in spin. Skin: No rashes.  Neuro: Alert, oriented X3, cranial nerves II-XII grossly intact, moves all extremities normally.  Psych: Patient is not psychotic, no suicidal or hemocidal ideation.  Labs on Admission: I have personally reviewed following labs and imaging studies  CBC: Recent Labs  Lab 04/26/21 1132  WBC 6.1  NEUTROABS 4.2  HGB 8.8*  HCT 29.8*  MCV 83.2  PLT 119   Basic Metabolic Panel: Recent Labs  Lab 04/26/21 1132  NA 140  K 3.8  CL 106  CO2 27  GLUCOSE 218*  BUN 30*  CREATININE 1.30*  CALCIUM 9.0   GFR: Estimated Creatinine Clearance: 77.3 mL/min (A) (by C-G formula based on SCr of 1.3 mg/dL (H)). Liver Function Tests: Recent Labs  Lab 04/26/21 1132  AST 24  ALT 21  ALKPHOS 52  BILITOT 0.4  PROT 7.4  ALBUMIN 3.2*   No results for input(s): LIPASE, AMYLASE in the last 168 hours. No results for input(s): AMMONIA in the last 168 hours. Coagulation Profile: No results for input(s): INR, PROTIME in the last 168 hours. Cardiac Enzymes: No results for input(s): CKTOTAL, CKMB, CKMBINDEX, TROPONINI in the last 168 hours. BNP (last 3 results) No results for input(s): PROBNP in the last 8760 hours. HbA1C: No results for input(s): HGBA1C in the last 72 hours. CBG: Recent Labs  Lab 04/26/21 1641  GLUCAP 122*   Lipid Profile: No results for input(s): CHOL, HDL, LDLCALC, TRIG, CHOLHDL, LDLDIRECT in the last 72 hours. Thyroid Function Tests: No results for input(s): TSH, T4TOTAL, FREET4, T3FREE, THYROIDAB in the last 72 hours. Anemia Panel: No results for input(s): VITAMINB12, FOLATE, FERRITIN, TIBC, IRON, RETICCTPCT in the last 72 hours. Urine analysis: No results found for: COLORURINE, APPEARANCEUR, LABSPEC, PHURINE, GLUCOSEU,  HGBUR, BILIRUBINUR, KETONESUR, PROTEINUR, UROBILINOGEN, NITRITE, LEUKOCYTESUR Sepsis Labs: _0 (procalcitonin:4,lacticidven:4) )No results found for this or any previous visit (from the past 240 hour(s)).   Radiological Exams on Admission: No results found.   EKG:  Not done in ED, will get one.   Assessment/Plan Principal Problem:   Wound infection Active Problems:   Type II diabetes mellitus with renal manifestations (HCC)   Essential hypertension   Hyperlipidemia   Morbid obesity (HCC)   Peripheral vascular disease (HCC)   Chronic diastolic CHF (congestive heart failure) (HCC)   CKD (chronic kidney disease), stage IIIa   Iron deficiency anemia   Wound infection in leg: Patient does not have sepsis.  No fever or leukocytosis.  Lactic acid is normal.  Patient has very significant bilateral leg swelling, erythema and nonhealing wound in left leg.  Suspecting this is related to PVD.  Will get vascular surgeon consultation  - Admitted to MedSurg bed as inpatient - Empiric antimicrobial treatment with vancomycin, Flagyl, Rocephin - Blood cultures x 2  - ESR and CRP - wound care consult - f/u LE doppler to r/o PE  PVD: -consulted Dr. Delana Meyer of vascular surgery  Type II diabetes mellitus with renal manifestations [facility]): Recent A1c 7.0.  Patient is taking Actos and Amaryl -Sliding scale insulin  Essential hypertension -IV hydralazine as needed -Lisinopril, oral hydralazine  Hyperlipidemia -Pravastatin  Morbid obesity ([place]): BMI 40.24 -Diet and exercise.   -Encouraged to lose weight.   Chronic diastolic CHF (congestive heart failure) ([place]): 2D echo on 04/14/2021 showed EF of 55-60% with grade 2 diastolic dysfunction.  Patient has severe bilateral leg edema, but BNP is normal.  No shortness of breath.  Does not seem to have CHF exacerbation.  His leg edema is likely related to chronic venous insufficiency change and lymphedema. -Started Lasix 40 mg twice daily  CKD  (chronic kidney disease), stage IIIa: Stable.  Recent creatinine 1.78 on 12/04/2020.  His creatinine is 1.30, BUN 30 -Follow-up with BMP  Iron deficiency anemia: Hemoglobin stable, 8.8 today (8.7 on 12/04/2020) -Continue home iron supplement    DVT ppx: SQ Lovenox Code Status: Full code Family Communication:  Yes, patient's cousin at bed side.  Disposition Plan:  Anticipate discharge back to previous environment Consults called: Dr. Delana Meyer  for vascular surgery Admission status and Level of care: Telemetry Medical:    as inpt        Status is: Inpatient  Remains inpatient appropriate because: Patient has multiple comorbidities, now presents with worsening chronic leg wounds with infection.  Though the patient does not have sepsis, yet his symptoms are very severe.  Legs are significantly swollen, erythematous,  and with open wound.  Suspecting this is related to PVD.  Will need further work-up and treatment for PVD.  His presentation is highly complicated.  Patient is at high risk of deteriorating he will need to be treated in hospital for at least 2 days.                Date of Service 04/26/2021    Ivor Costa [facility]   If 7PM-7AM, please contact night-coverage [email or web address] 04/26/2021, 4:52 PM

## 2021-04-26 NOTE — ED Triage Notes (Signed)
Pt to ED from wound center for possible IV diuresis and IV antibiotics, reports weight gain since Friday and unsure if wound care being completed.  Diabetic  Wounds to bilateral legs for over a year Alert and oriented Swelling/ edema noted to BLE

## 2021-04-26 NOTE — ED Notes (Signed)
Pt spilled urine on self while urinating in urinal. Pt cleaned, gown and linen changed. Pt tolerated well. Pt has open areas and redness with peeling skin on bilateral lower legs. Has blanching purplish discoloration on bilateral buttocks, scrotum and inner thigh, denies pain in this area.

## 2021-04-26 NOTE — ED Provider Notes (Signed)
[facility] Emergency Department Provider Note   ____________________________________________   Event Date/Time   First MD Initiated Contact with Patient 04/26/21 1349     (approximate)  I have reviewed the triage vital signs and the nursing notes.   HISTORY  Chief Complaint Wound Check    HPI [name] is a 70 y.o. male with past medical history of hypertension, hyperlipidemia, diabetes, and lymphedema who presents to the ED for leg swelling and wound check.  Patient states he has been dealing with swelling in both legs that has been worsening over the past 2 years.  He has been following regularly with wound care and has had a couple rounds of antibiotics, most recently in July.  Swelling has particularly seemed to worsen over the past couple of days with a blister opening up on his left shin.  He followed up with his wound care provider earlier today, who recommended he come to the ED for IV diuresis as well as antibiotics.  Patient denies any fevers but has noticed foul-smelling drainage from the area of open blister.  He states he is not currently on any diuretics as they had dropped his blood pressure in the past.  He does not have any chest pain or shortness of breath.        Past Medical History:  Diagnosis Date   Diabetes mellitus without complication ([place])    Hyperlipidemia    Hypertension    Leukemia ([place])     Patient Active Problem List   Diagnosis Date Noted   Wound infection 04/26/2021   Chronic diastolic CHF (congestive heart failure) ([place]) 04/26/2021   CKD (chronic kidney disease), stage IIIa 04/26/2021   Iron deficiency anemia 04/26/2021   Difficulty in walking, not elsewhere classified 01/14/2021   AKI (acute kidney injury) ([place]) 12/03/2020   Hyperkalemia 12/03/2020   Edema 07/02/2020   Hypertensive heart disease without congestive heart failure 07/02/2020   Other personal history presenting hazards to health 07/02/2020    Morbid obesity ([place]) 07/02/2020   Peripheral vascular disease ([place]) 07/02/2020   Cataract 07/02/2020   Psychosexual dysfunction with inhibited sexual excitement 07/02/2020   Issue of medical certificate for disability examination 07/02/2020   Tobacco use disorder 22/63/3354   Umbilical hernia 56/25/6389   Pain due to onychomycosis of toenails of both feet 01/23/2020   Venous ulcer ([place]) 04/11/2019   Chronic venous insufficiency 07/07/2016   Venous stasis dermatitis of both lower extremities 07/07/2016   Lymphedema 07/07/2016   Type II diabetes mellitus with renal manifestations ([place]) 07/07/2016   Essential hypertension 07/07/2016   Hyperlipidemia 07/07/2016    Past Surgical History:  Procedure Laterality Date   CATARACT EXTRACTION Right    TONSILLECTOMY     TOOTH EXTRACTION      Prior to Admission medications   Medication Sig Start Date End Date Taking? Authorizing Provider  aspirin 81 MG chewable tablet Chew 81 mg by mouth daily.   Yes [provider]  Cholecalciferol 25 MCG (1000 UT) tablet Take 2 tablets by mouth daily. 09/11/14  Yes [provider]  ferrous sulfate 325 (65 FE) MG EC tablet Take 325 mg by mouth daily.   Yes [provider]  glimepiride (AMARYL) 1 MG tablet Take 1 tablet by mouth daily. 02/23/21  Yes [provider]  hydrALAZINE (APRESOLINE) 25 MG tablet Take 75 mg by mouth in the morning, at noon, and at bedtime. 04/12/21  Yes [provider]  lisinopril (ZESTRIL) 40 MG tablet TAKE  ONE TABLET BY MOUTH EVERY DAY FOR BLOOD PRESSURE **REPLACES LISINOPRIL/HCTZ 04/13/20  Yes [provider]  Multiple Vitamin (MULTIVITAMIN WITH MINERALS) TABS tablet Take 1 tablet by mouth daily. 12/04/20  Yes Lorella Nimrod, MD  nystatin (MYCOSTATIN/NYSTOP) powder Apply to toe crevices with unna wrap changes 07/31/20  Yes Kris Hartmann, NP  Omega-3 Fatty Acids (FISH OIL) 1000 MG CAPS Take 1 capsule by mouth daily.   Yes [provider]  pioglitazone (ACTOS) 15 MG tablet Take 15 mg by mouth daily.   Yes [provider]  pravastatin (PRAVACHOL) 80 MG tablet Take 40 mg by mouth daily. daily   Yes [provider]  sertraline (ZOLOFT) 100 MG tablet Take 100 mg by mouth daily.    Yes [provider]  silver sulfADIAZINE (SILVADENE) 1 % cream Apply to left ankle with unna wrap changes 07/31/20  Yes Kris Hartmann, NP  bisoprolol (ZEBETA) 5 MG tablet Take 1 tablet by mouth daily. Patient not taking: Reported on 04/26/2021 06/15/20   [provider]  Ensure Max Protein (ENSURE MAX PROTEIN) LIQD Take 330 mLs (11 oz total) by mouth 2 (two) times daily. 12/04/20   Lorella Nimrod, MD  furosemide (LASIX) 20 MG tablet Take 1 tablet (20 mg total) by mouth daily. Patient not taking: Reported on 01/14/2021 12/04/20 12/04/21  Lorella Nimrod, MD  hydrALAZINE (APRESOLINE) 10 MG tablet Take 10 mg by mouth 3 (three) times daily. Patient not taking: Reported on 04/26/2021    [provider]  metFORMIN (GLUCOPHAGE) 1000 MG tablet Take 1,000 mg by mouth 2 (two) times daily with a meal. Patient not taking: Reported on 01/14/2021    [provider]    Allergies Patient has no known allergies.  Family History  Problem Relation Age of Onset   Cancer Mother    Stroke Father    Cancer Sister     Social History Social History   Tobacco Use   Smoking status: Former   Smokeless tobacco: Never  Scientific laboratory technician Use: Never used  Substance Use Topics   Alcohol use: No   Drug use: No    Review of Systems  Constitutional: No fever/chills Eyes: No visual changes. ENT: No sore throat. Cardiovascular: Denies chest pain. Respiratory: Denies shortness of breath. Gastrointestinal: No abdominal pain.  No nausea, no vomiting.  No diarrhea.  No constipation. Genitourinary: Negative for dysuria. Musculoskeletal: Negative for back pain.  Positive for leg swelling and leg wounds. Skin:  Negative for rash. Neurological: Negative for headaches, focal weakness or numbness.  ____________________________________________   PHYSICAL EXAM:  VITAL SIGNS: ED Triage Vitals  Enc Vitals Group     BP 04/26/21 1133 (!) 134/52     Pulse Rate 04/26/21 1132 83     Resp 04/26/21 1132 20     Temp 04/26/21 1132 (!) 97.5 F (36.4 C)     Temp Source 04/26/21 1132 Oral     SpO2 04/26/21 1132 94 %     Weight 04/26/21 1133 (!) 305 lb (138.3 kg)     Height 04/26/21 1133 6\' 1"  (1.854 m)     Head Circumference --      Peak Flow --      Pain Score 04/26/21 1133 0     Pain Loc --      Pain Edu? --      Excl. in [place]? --     Constitutional: Alert and oriented. Eyes: Conjunctivae are normal. Head: Atraumatic. Nose: No congestion/rhinnorhea.  Mouth/Throat: Mucous membranes are moist. Neck: Normal ROM Cardiovascular: Normal rate, regular rhythm. Grossly normal heart sounds. Respiratory: Normal respiratory effort.  No retractions. Lungs CTAB. Gastrointestinal: Soft and nontender. No distention. Genitourinary: deferred Musculoskeletal: Severe edema to bilateral lower extremities and with blistering and areas of serous drainage noted.  Open blistering noted to left mid shin with purulent drainage, mild associated erythema and warmth. Neurologic:  Normal speech and language. No gross focal neurologic deficits are appreciated. Skin:  Skin is warm, dry and intact. No rash noted. Psychiatric: Mood and affect are normal. Speech and behavior are normal.  ____________________________________________   LABS (all labs ordered are listed, but only abnormal results are displayed)  Labs Reviewed  COMPREHENSIVE METABOLIC PANEL - Abnormal; Notable for the following components:      Result Value   Glucose, Bld 218 (*)    BUN 30 (*)    Creatinine, Ser 1.30 (*)    Albumin 3.2 (*)    GFR, Estimated 59 (*)    All other components within normal limits  CBC WITH DIFFERENTIAL/PLATELET - Abnormal; Notable  for the following components:   RBC 3.58 (*)    Hemoglobin 8.8 (*)    HCT 29.8 (*)    MCH 24.6 (*)    MCHC 29.5 (*)    RDW 17.4 (*)    All other components within normal limits  SEDIMENTATION RATE - Abnormal; Notable for the following components:   Sed Rate 85 (*)    All other components within normal limits  RESP PANEL BY RT-PCR (FLU A&B, COVID) [id number]  CULTURE, BLOOD (ROUTINE X 2)  CULTURE, BLOOD (ROUTINE X 2)  LACTIC ACID, PLASMA  LACTIC ACID, PLASMA  URINALYSIS, ROUTINE W REFLEX MICROSCOPIC  BRAIN NATRIURETIC PEPTIDE  C-REACTIVE PROTEIN    PROCEDURES  Procedure(s) performed (including Critical Care):  Procedures   ____________________________________________   INITIAL IMPRESSION / ASSESSMENT AND PLAN / ED COURSE      70 year old male with past medical history of hypertension, hyperlipidemia, diabetes, and lymphedema who presents to the ED with increased swelling to bilateral lower extremities with area of redness and foul-smelling drainage to his left lower extremity.  There appears to be a component of both lymphedema and fluid accumulation in both of his legs, left greater than right.  There is also concern for infection affecting large blister to his left leg.  Labs are unremarkable, no findings to suggest sepsis.  Patient would benefit from IV diuresis as well as IV antibiotics and wound care here in the hospital.  Case discussed with hospitalist for admission.      ____________________________________________   FINAL CLINICAL IMPRESSION(S) / ED DIAGNOSES  Final diagnoses:  Peripheral edema  Left leg cellulitis     ED Discharge Orders     None        Note:  This document was prepared using Dragon voice recognition software and may include unintentional dictation errors.    Blake Divine, MD 04/26/21 1620

## 2021-04-27 DIAGNOSIS — I890 Lymphedema, not elsewhere classified: Secondary | ICD-10-CM

## 2021-04-27 DIAGNOSIS — I872 Venous insufficiency (chronic) (peripheral): Secondary | ICD-10-CM

## 2021-04-27 LAB — BASIC METABOLIC PANEL
Anion gap: 6 (ref 5–15)
BUN: 27 mg/dL — ABNORMAL HIGH (ref 8–23)
CO2: 29 mmol/L (ref 22–32)
Calcium: 8.6 mg/dL — ABNORMAL LOW (ref 8.9–10.3)
Chloride: 105 mmol/L (ref 98–111)
Creatinine, Ser: 1.48 mg/dL — ABNORMAL HIGH (ref 0.61–1.24)
GFR, Estimated: 51 mL/min — ABNORMAL LOW (ref >60)
Glucose, Bld: 131 mg/dL — ABNORMAL HIGH (ref 70–99)
Potassium: 3.8 mmol/L (ref 3.5–5.1)
Sodium: 140 mmol/L (ref 135–145)

## 2021-04-27 LAB — MAGNESIUM: Magnesium: 1.8 mg/dL (ref 1.7–2.4)

## 2021-04-27 LAB — CBG MONITORING, ED
Glucose-Capillary: 164 mg/dL — ABNORMAL HIGH (ref 70–99)
Glucose-Capillary: 142 mg/dL — ABNORMAL HIGH (ref 70–99)

## 2021-04-27 LAB — GLUCOSE, CAPILLARY: Glucose-Capillary: 180 mg/dL — ABNORMAL HIGH (ref 70–99)

## 2021-04-27 MED ORDER — HYDROCERIN EX CREA
TOPICAL_CREAM | Freq: Two times a day (BID) | CUTANEOUS | Status: DC
Start: 2021-04-27 — End: 2021-04-30
  Administered 2021-04-28: 1 via TOPICAL
  Filled 2021-04-27: qty 113

## 2021-04-27 MED ORDER — LISINOPRIL 20 MG PO TABS
20.0000 mg | ORAL_TABLET | Freq: Every day | ORAL | Status: DC
  Administered 2021-04-28 – 2021-04-29 (×2): 20 mg via ORAL
  Filled 2021-04-27 (×2): qty 1

## 2021-04-27 NOTE — Evaluation (Signed)
Occupational Therapy Evaluation Patient Details Name: [name] MRN: [id number] DOB: December 07, 1950 Today's Date: 04/27/2021   History of Present Illness 70 y.o. male with medical history significant of PVD, hypertension, hyperlipidemia, diabetes mellitus, dCHF, CKD, lymphedema, venous insufficiency, former smoker, iron deficiency anemia, who presents with significant bilateral leg swelling, erythema, and nonhealing wound in left leg.  Pending vascular surgeon consult   Clinical Impression   Pt seen for OT evaluation this date. Prior to admission, pt was mod-independent with [facility] for ADLs (requires increased time/effort for donning shoes) and functional mobility, living in a 1-story home alone. Pt reports that he can perform IADLs independently however will ask family/friends to assist with trash/cleaning/etc when they come to the house. Pt currently requires MIN GUARD for transfers to/from [facility], MIN GUARD for sit>stand peri-care, and SUPERVISION for standing hand hygiene due to current functional impairments (See OT Problem List below). Of note, pt reported feeling lightheaded while on BSC. Pt immediately returned to bed and placed in supine position (vitals while supine: BP: 97/43, HR 70, SpO2 92%). Pt attributed lightheadedness to walking further than usual during PT session. Once symptoms resolved, pt performed supine>sit and sit>stand transfer for posterior peri-care. At end of session, pt left in bed in no acute distress with BP 101/53. RN informed of vitals and symptoms during mobility. Pt would benefit from additional skilled OT services to maximize return to PLOF and minimize risk of future falls, injury, caregiver burden, and readmission. Upon discharge, recommend [place] services.    Recommendations for follow up therapy are one component of a multi-disciplinary discharge planning process, led by the attending physician.  Recommendations may be updated based on patient status, additional functional  criteria and insurance authorization.   Follow Up Recommendations  Home health OT    Assistance Recommended at Discharge Intermittent Supervision/Assistance  Functional Status Assessment  Patient has had a recent decline in their functional status and demonstrates the ability to make significant improvements in function in a reasonable and predictable amount of time.  Equipment Recommendations  None recommended by OT       Precautions / Restrictions Precautions Precautions: Fall Restrictions Weight Bearing Restrictions: No      Mobility Bed Mobility Overal bed mobility: Needs Assistance Bed Mobility: Supine to Sit;Sit to Supine     Supine to sit: Supervision;HOB elevated Sit to supine: Supervision;HOB elevated   General bed mobility comments: SUP for safety; increased time and effort. Bed features used.    Transfers Overall transfer level: Needs assistance Equipment used: Straight cane Transfers: Sit to/from Stand Sit to Stand: Min guard           General transfer comment: CGA for safety, no steadying required.      Balance Overall balance assessment: Needs assistance Sitting-balance support: No upper extremity supported;Feet supported Sitting balance-Leahy Scale: Good Sitting balance - Comments: Good sitting balance at EOB   Standing balance support: No upper extremity supported;During functional activity Standing balance-Leahy Scale: Fair Standing balance comment: MIN GUARD during standing hand hygiene                           ADL either performed or assessed with clinical judgement   ADL Overall ADL's : Needs assistance/impaired     Grooming: Wash/dry hands;Supervision/safety;Set up;Standing                   Toilet Transfer: Min guard;Ambulation;BSC/3in1   Toileting- Water quality scientist and Hygiene: Min guard;Sit to/from stand Toileting -  Clothing Manipulation Details (indicate cue type and reason): MIN GUARD for posterior  peri-care     Functional mobility during ADLs: Min guard;Cane       Vision Baseline Vision/History: 1 Wears glasses Ability to See in Adequate Light: 0 Adequate Patient Visual Report: No change from baseline              Pertinent Vitals/Pain Pain Assessment: No/denies pain        Extremity/Trunk Assessment Upper Extremity Assessment Upper Extremity Assessment: Overall WFL for tasks assessed   Lower Extremity Assessment Lower Extremity Assessment: Generalized weakness       Communication Communication Communication: HOH;No difficulties   Cognition Arousal/Alertness: Awake/alert Behavior During Therapy: WFL for tasks assessed/performed Overall Cognitive Status: Within Functional Limits for tasks assessed                                 General Comments: A&Ox4     General Comments  Pt reported feeling lightheaded while on BSC. Pt immediately returned to bed and placed in supine position (vitals while supine: BP: 97/43, HR 70, SpO2 92%). Pt attributing lightheadedness to walking further than usual during PT session. Once symptoms resolved, pt performed supine>sit and sit>stand transfer for posterior peri-care. At end of session, pt left in bed in no acute distress with BP 101/53. RN informed of vitals and symptoms during mobility    Exercises Other Exercises Other Exercises: PT assisted with donning gown for pt modesty and special sandals to accomodate wounds and lymphedema.        Home Living Family/patient expects to be discharged to:: Private residence Living Arrangements: Alone Available Help at Discharge: Family;Available PRN/intermittently Type of Home: House Home Access: Stairs to enter Entrance Stairs-Number of Steps: 1 Entrance Stairs-Rails: None (uses cane for balance) Home Layout: One level     Bathroom Shower/Tub: Occupational psychologist: Standard     Home Equipment: Toilet riser;Cane - single point;Hospital bed;Rollator  (4 wheels);Grab bars - tub/shower;Shower seat - built in (lift chair)   Additional Comments: Typically uses SPC and occasionaly no AD within the home. Grab bar outside of the shower.      Prior Functioning/Environment Prior Level of Function : Independent/Modified Independent;Driving;History of Falls (last six months)             Mobility Comments: Pt is Mod I with mobility; typically uses a SPC. Able to ambulate household ambulation distances. He does drive and admits 1 fall in the last 6 months while he was in the parking lot of the doctors office. ADLs Comments: Independent with ADLs; can be independent with IADLs however will ask family/friends to assist with trash/cleaning/etc when they come to the house. His cousin does the grocery shopping and delivers them to the house. Endorses some difficulty with donning shoes        OT Problem List: Decreased strength;Decreased activity tolerance;Impaired balance (sitting and/or standing);Decreased knowledge of precautions;Cardiopulmonary status limiting activity;Increased edema      OT Treatment/Interventions: Self-care/ADL training;Therapeutic exercise;Energy conservation;DME and/or AE instruction;Therapeutic activities;Patient/family education;Balance training    OT Goals(Current goals can be found in the care plan section) Acute Rehab OT Goals Patient Stated Goal: to return home OT Goal Formulation: With patient Time For Goal Achievement: 05/11/21 Potential to Achieve Goals: Good ADL Goals Pt Will Perform Grooming: with modified independence;standing (while implementing at least 1 energy conservation strategy) Pt Will Perform Lower Body Dressing: with min guard assist;sitting/lateral  leans Pt Will Transfer to Toilet: with modified independence;ambulating;bedside commode  OT Frequency: Min 2X/week    AM-PAC OT "6 Clicks" Daily Activity     Outcome Measure Help from another person eating meals?: None Help from another person taking  care of personal grooming?: A Little Help from another person toileting, which includes using toliet, bedpan, or urinal?: A Little Help from another person bathing (including washing, rinsing, drying)?: A Lot Help from another person to put on and taking off regular upper body clothing?: None Help from another person to put on and taking off regular lower body clothing?: A Lot 6 Click Score: 18   End of Session Equipment Utilized During Treatment: Gait belt;Other (comment) [facility]) Nurse Communication: Mobility status;Other (comment) (vitals during mobility)  Activity Tolerance: Patient tolerated treatment well Patient left: in bed;with call bell/phone within reach;with bed alarm set  OT Visit Diagnosis: Unsteadiness on feet (R26.81);Muscle weakness (generalized) (M62.81)                Time: 1000-1033 OT Time Calculation (min): 33 min Charges:  OT General Charges $OT Visit: 1 Visit OT Evaluation $OT Eval Moderate Complexity: 1 Mod OT Treatments $Self Care/Home Management : 23-37 mins  Fredirick Maudlin, OTR/L [place]

## 2021-04-27 NOTE — Evaluation (Signed)
Physical Therapy Evaluation Patient Details Name: [name] MRN: [id number] DOB: Oct 23, 1950 Today's Date: 04/27/2021  History of Present Illness  70 y.o. male with medical history significant of PVD, hypertension, hyperlipidemia, diabetes mellitus, dCHF, CKD, lymphedema, venous insufficiency, former smoker, iron deficiency anemia, who presents with significant bilateral leg swelling, erythema, and nonhealing wound in left leg.  Pending vascular surgeon consult  Clinical Impression  Pt received supine in bed, agreeable to therapy. He is [facility] - hears out of L ear better than R. He presents with lymphedema and wounds to bilateral lower legs, generalized weakness within BLE and decreased functional endurance as witnessed during ambulation. PT monitored and verbally checked in with pt multiple times during the 373ft walk - pt fatigued and required a standing rest break towards end of ambulation. Upon completion, pt reports that distance was further than he typically walks. CGA was provided throughout session however steadying was not required. He did use SPC with mobility. Pt would benefit from continued PT services while in the acute setting to address weakness and endurance with mobility. Recommending HHPT at d/c.      Recommendations for follow up therapy are one component of a multi-disciplinary discharge planning process, led by the attending physician.  Recommendations may be updated based on patient status, additional functional criteria and insurance authorization.  Follow Up Recommendations Home health PT    Assistance Recommended at Discharge PRN  Functional Status Assessment Patient has had a recent decline in their functional status and demonstrates the ability to make significant improvements in function in a reasonable and predictable amount of time.  Equipment Recommendations  None recommended by PT    Recommendations for Other Services       Precautions / Restrictions  Precautions Precautions: Fall Restrictions Weight Bearing Restrictions: No      Mobility  Bed Mobility Overal bed mobility: Needs Assistance Bed Mobility: Supine to Sit     Supine to sit: Supervision;HOB elevated     General bed mobility comments: SUP for safety; increased time and effort. Bed features used.    Transfers Overall transfer level: Needs assistance Equipment used: Straight cane Transfers: Sit to/from Stand Sit to Stand: Min guard           General transfer comment: CGA for safety, no steadying required. Definite use of hands to assist with stand.    Ambulation/Gait Ambulation/Gait assistance: Min guard Gait Distance (Feet): 300 Feet Assistive device: Straight cane Gait Pattern/deviations: Step-through pattern;Wide base of support Gait velocity: decreased     General Gait Details: CGA at all times using SPC; one standing rest break ~54ft prior to returning to room. No steadying required however increased lateral weightshifting during ambulation. Fatigue upon completion.  Stairs            Wheelchair Mobility    Modified Rankin (Stroke Patients Only)       Balance Overall balance assessment: Needs assistance Sitting-balance support: No upper extremity supported;Feet supported Sitting balance-Leahy Scale: Good Sitting balance - Comments: No LOB     Standing balance-Leahy Scale: Fair Standing balance comment: Uses SPC during ambulation; does not rely on it during static standing.                             Pertinent Vitals/Pain Pain Assessment: No/denies pain    Home Living Family/patient expects to be discharged to:: Private residence Living Arrangements: Alone Available Help at Discharge: Family;Available PRN/intermittently Type of Home: House Home Access:  Stairs to enter Entrance Stairs-Rails: None (uses cane for balance) Entrance Stairs-Number of Steps: 1   Home Layout: One level Home Equipment: Toilet  riser;Cane - single point;Hospital bed;Rollator (4 wheels);Grab bars - tub/shower (lift chair) Additional Comments: Typically uses SPC and occasionaly no AD within the home. Grab bar outside of the shower.    Prior Function Prior Level of Function : Independent/Modified Independent;Driving;History of Falls (last six months)             Mobility Comments: Pt is Mod I with mobility; typically uses a SPC. Able to ambulate household ambulation distances. He does drive and admits 1 fall in the last 6 months while he was in the parking lot of the doctors office. ADLs Comments: Independent with ADLs; can be independent with IADLs however will ask family/friends to assist with trash/cleaning/etc when they come to the house. His cousin does the grocery shopping and delivers them to the house.     Hand Dominance        Extremity/Trunk Assessment   Upper Extremity Assessment Upper Extremity Assessment: Overall WFL for tasks assessed;Generalized weakness    Lower Extremity Assessment Lower Extremity Assessment: Generalized weakness       Communication   Communication: HOH;No difficulties  Cognition Arousal/Alertness: Awake/alert Behavior During Therapy: WFL for tasks assessed/performed Overall Cognitive Status: Within Functional Limits for tasks assessed                                 General Comments: A&Ox4        General Comments      Exercises Other Exercises Other Exercises: PT assisted with donning gown for pt modesty and special sandals to accomodate wounds and lymphedema.   Assessment/Plan    PT Assessment Patient needs continued PT services  PT Problem List Decreased strength;Decreased mobility;Decreased safety awareness;Decreased activity tolerance;Decreased balance;Decreased skin integrity       PT Treatment Interventions Therapeutic exercise;Gait training;Balance training;Stair training;Neuromuscular re-education;Functional mobility  training;Therapeutic activities;Patient/family education    PT Goals (Current goals can be found in the Care Plan section)  Acute Rehab PT Goals Patient Stated Goal: to go home PT Goal Formulation: With patient Time For Goal Achievement: 05/11/21 Potential to Achieve Goals: Good    Frequency Min 2X/week   Barriers to discharge        Co-evaluation               AM-PAC PT "6 Clicks" Mobility  Outcome Measure Help needed turning from your back to your side while in a flat bed without using bedrails?: None Help needed moving from lying on your back to sitting on the side of a flat bed without using bedrails?: None Help needed moving to and from a bed to a chair (including a wheelchair)?: A Little Help needed standing up from a chair using your arms (e.g., wheelchair or bedside chair)?: A Little Help needed to walk in hospital room?: A Little Help needed climbing 3-5 steps with a railing? : A Little 6 Click Score: 20    End of Session Equipment Utilized During Treatment: Gait belt Activity Tolerance: Patient tolerated treatment well;Patient limited by fatigue Patient left: in bed;with call bell/phone within reach;Other (comment) (Pt sitting EOB for hand off to OT.) Nurse Communication: Mobility status PT Visit Diagnosis: Other abnormalities of gait and mobility (R26.89);Muscle weakness (generalized) (M62.81);History of falling (Z91.81);Difficulty in walking, not elsewhere classified (R26.2)    Time: 0932-1000 PT Time Calculation (  min) (ACUTE ONLY): 28 min   Charges:   PT Evaluation $PT Eval Moderate Complexity: 1 Mod PT Treatments $Therapeutic Activity: 8-22 mins        Patrina Levering PT, DPT 04/27/21 1:21 PM [phone]

## 2021-04-27 NOTE — ED Notes (Signed)
Nurse Katie informed of assigned bed on the board °

## 2021-04-27 NOTE — ED Notes (Signed)
Wound care provided.

## 2021-04-27 NOTE — Consult Note (Signed)
[facility] Vascular Consult Note  MRN : [id number]  [name] is a 70 y.o. (10/23/1950) male who presents with chief complaint of  Chief Complaint  Patient presents with   Wound Check  .  History of Present Illness:   I am asked to evaluate the patient by Dr. Charna Archer.  Patient is a 70 year old gentleman well-known to my service who has chronic lymphedema and a long history of noncompliance.  He presented to the emergency room yesterday because of increased swelling and a chronic wound of his left leg.  He has for years relied on Unna boots for treatment unable to use compression for multiple stated reasons.  He does have a lymph pump at home but feels it does not help much and does not sound like he uses it very often.  In the emergency room he was felt to have changes consistent with cellulitis and has been admitted for IV antibiotics.  Current Facility-Administered Medications  Medication Dose Route Frequency Provider Last Rate Last Admin   acetaminophen (TYLENOL) 160 MG/5ML solution 650 mg  650 mg Oral Q6H PRN Ivor Costa, MD       aspirin chewable tablet 81 mg  81 mg Oral Daily Ivor Costa, MD   81 mg at 04/27/21 1134   cefTRIAXone (ROCEPHIN) 2 g in sodium chloride 0.9 % 100 mL IVPB  2 g Intravenous Q24H Ivor Costa, MD 200 mL/hr at 04/27/21 1819 2 g at 04/27/21 1819   cholecalciferol (VITAMIN D3) tablet 2,000 Units  2,000 Units Oral Daily Ivor Costa, MD   2,000 Units at 04/27/21 1134   enoxaparin (LOVENOX) injection 40 mg  40 mg Subcutaneous Q24H Ivor Costa, MD   40 mg at 04/27/21 1818   ferrous sulfate tablet 325 mg  325 mg Oral Daily Ivor Costa, MD   325 mg at 04/27/21 1134   furosemide (LASIX) injection 40 mg  40 mg Intravenous Huntley Dec, MD   40 mg at 04/27/21 1819   hydrALAZINE (APRESOLINE) injection 5 mg  5 mg Intravenous Q2H PRN Ivor Costa, MD       hydrALAZINE (APRESOLINE) tablet 75 mg  75 mg Oral Q8H Ivor Costa, MD   75 mg at 04/27/21 5726    hydrocerin (EUCERIN) cream   Topical BID Barb Merino, MD   Given at 04/27/21 1449   insulin aspart (novoLOG) injection 0-5 Units  0-5 Units Subcutaneous QHS Ivor Costa, MD       insulin aspart (novoLOG) injection 0-9 Units  0-9 Units Subcutaneous TID WC Ivor Costa, MD       Derrill Memo ON 04/28/2021] lisinopril (ZESTRIL) tablet 20 mg  20 mg Oral Daily Ghimire, Dante Gang, MD       metroNIDAZOLE (FLAGYL) IVPB 500 mg  500 mg Intravenous Q12H Ivor Costa, MD   Stopped at 04/27/21 2035   multivitamin with minerals tablet 1 tablet  1 tablet Oral Daily Ivor Costa, MD   1 tablet at 04/27/21 1134   nystatin (MYCOSTATIN/NYSTOP) topical powder   Topical Daily Oswald Hillock, [facility]   Given at 04/27/21 1450   omega-3 acid ethyl esters (LOVAZA) capsule 1 g  1 g Oral Daily Ivor Costa, MD   1 g at 04/27/21 1139   ondansetron (ZOFRAN) injection 4 mg  4 mg Intravenous Q8H PRN Ivor Costa, MD       pravastatin (PRAVACHOL) tablet 40 mg  40 mg Oral Daily Ivor Costa, MD       sertraline (ZOLOFT) tablet 200  mg  200 mg Oral Daily Ivor Costa, MD   200 mg at 04/27/21 1134   silver sulfADIAZINE (SILVADENE) 1 % cream   Topical Daily Oswald Hillock, [facility]   Given at 04/27/21 1449   vancomycin (VANCOREADY) IVPB 1750 mg/350 mL  1,750 mg Intravenous Q24H Ivor Costa, MD 175 mL/hr at 04/27/21 1655 1,750 mg at 04/27/21 1655    Past Medical History:  Diagnosis Date   Diabetes mellitus without complication ([place])    Hyperlipidemia    Hypertension    Leukemia ([place])     Past Surgical History:  Procedure Laterality Date   CATARACT EXTRACTION Right    TONSILLECTOMY     TOOTH EXTRACTION      Social History Social History   Tobacco Use   Smoking status: Former   Smokeless tobacco: Never  Scientific laboratory technician Use: Never used  Substance Use Topics   Alcohol use: No   Drug use: No    Family History Family History  Problem Relation Age of Onset   Cancer Mother    Stroke Father    Cancer Sister     No Known  Allergies   REVIEW OF SYSTEMS (Negative unless checked)  Constitutional: [] Weight loss  [] Fever  [] Chills Cardiac: [] Chest pain   [] Chest pressure   [] Palpitations   [] Shortness of breath when laying flat   [] Shortness of breath at rest   [] Shortness of breath with exertion. Vascular:  [] Pain in legs with walking   [x] Pain in legs at rest   [] Pain in legs when laying flat   [] Claudication   [] Pain in feet when walking  [] Pain in feet at rest  [] Pain in feet when laying flat   [] History of DVT   [] Phlebitis   [x] Swelling in legs   [] Varicose veins   [x] Non-healing ulcers Pulmonary:   [] Uses home oxygen   [] Productive cough   [] Hemoptysis   [] Wheeze  [] COPD   [] Asthma Neurologic:  [] Dizziness  [] Blackouts   [] Seizures   [] History of stroke   [] History of TIA  [] Aphasia   [] Temporary blindness   [] Dysphagia   [] Weakness or numbness in arms   [] Weakness or numbness in legs Musculoskeletal:  [] Arthritis   [] Joint swelling   [] Joint pain   [] Low back pain Hematologic:  [] Easy bruising  [] Easy bleeding   [] Hypercoagulable state   [] Anemic  [] Hepatitis Gastrointestinal:  [] Blood in stool   [] Vomiting blood  [] Gastroesophageal reflux/heartburn   [] Difficulty swallowing. Genitourinary:  [x] Chronic kidney disease   [] Difficult urination  [] Frequent urination  [] Burning with urination   [] Blood in urine Skin:  [] Rashes   [] Ulcers   [] Wounds Psychological:  [] History of anxiety   []  History of major depression.    Physical Examination  Vitals:   04/27/21 1645 04/27/21 1700 04/27/21 1730 04/27/21 1854  BP:  121/63 (!) 123/55 120/60  Pulse: 66 66 63 71  Resp:    18  Temp:    98.2 F (36.8 C)  TempSrc:    Oral  SpO2: 98% 90% 96% 97%  Weight:      Height:       Body mass index is 40.24 kg/m.  Head: [place]/AT, No temporalis wasting. Prominent temp pulse not noted. Ear/Nose/Throat: Nares w/o erythema or drainage, oropharynx w/o obsrtuction,  Eyes: PERRLA, Sclera nonicteric.  Neck: Supple, no nuchal  rigidity.  No bruit or JVD.  Pulmonary:  Breath sounds equal bilaterally, no use of accessory muscles.  Cardiac: RRR, normal S1, S2, no Murmurs,  rubs or gallops. Vascular: Both legs are wrapped in Unna boots.  The forefoot is exposed and he has changes consistent with bilateral cellulitis.  He has profound swelling with lymphedematous changes and lichenification of the skin of all his toes as well as his legs below the knees. Gastrointestinal: soft, non-tender, non-distended.  Musculoskeletal: Moves all extremities.  No deformity or atrophy.  Greater than 4+ edema. Neurologic: CN 2-12 intact. Symmetrical.  Speech is fluent.  Psychiatric: Judgment intact, Mood & affect appropriate for pt's clinical situation. Dermatologic: Severe venous and lymphatic rashes with ulcers noted.  No cellulitis or open wounds.      CBC Lab Results  Component Value Date   WBC 6.1 04/26/2021   HGB 8.8 (L) 04/26/2021   HCT 29.8 (L) 04/26/2021   MCV 83.2 04/26/2021   PLT 204 04/26/2021    BMET    Component Value Date/Time   NA 140 04/27/2021 0555   K 3.8 04/27/2021 0555   CL 105 04/27/2021 0555   CO2 29 04/27/2021 0555   GLUCOSE 131 (H) 04/27/2021 0555   BUN 27 (H) 04/27/2021 0555   CREATININE 1.48 (H) 04/27/2021 0555   CALCIUM 8.6 (L) 04/27/2021 0555   GFRNONAA 51 (L) 04/27/2021 0555   Estimated Creatinine Clearance: 67.9 mL/min (A) (by C-G formula based on SCr of 1.48 mg/dL (H)).  Assessment/Plan 1.  Lymphedema with cellulitis and chronic ulceration: Patient is appropriately admitted and is started on parenteral antibiotics.  I have physically elevated the foot of his bed so that his ankles are at or slightly above heart level.  Unna boot changes will be continued.  Serial examinations will be performed to ensure his cellulitis is improving.  On questioning whether the he is involved the [place] to assist in his care of his lymphedema he stated no.  2. Essential hypertension Continue antihypertensive  medications as already ordered, these medications have been reviewed and there are no changes at this time.    3. Mixed hyperlipidemia Continue statin as ordered and reviewed, no changes at this time    4. AKI (acute kidney injury) [name]) The patient has had some worsening renal function.  It was previously suggested to the patient to have a referral to a nephrologist but he was resistant.  He recently received a referral from his [facility] and will be seeing nephrology soon.   Hortencia Pilar, MD  04/27/2021 7:33 PM

## 2021-04-27 NOTE — Progress Notes (Signed)
KASON, BENAK ([id number]) Visit Report for 04/26/2021 Arrival Information Details Patient Name: [name], [name] Date of Service: 04/26/2021 8:00 AM Medical Record Number: [id number] Patient Account Number: [id number] Date of Birth/Sex: 15-Dec-1950 (70 y.o. M) Treating RN: [name] Primary Care Aayan Haskew: Felipa Eth Other Clinician: Referring Abdalrahman Clementson: Felipa Eth Treating Hermenia Fritcher/Extender: Skipper Cliche in Treatment: 12 Visit Information History Since Last Visit All ordered tests and consults were completed: No Patient Arrived: [name] Added or deleted any medications: No Arrival Time: 08:22 Any new allergies or adverse reactions: No Accompanied By: self Had a fall or experienced change in No Transfer Assistance: None activities of daily living that may affect Patient Identification Verified: Yes risk of falls: Secondary Verification Process Completed: Yes Signs or symptoms of abuse/neglect since last visito No Patient Requires Transmission-Based Precautions: No Hospitalized since last visit: No Patient Has Alerts: No Implantable device outside of the clinic excluding No cellular tissue based products placed in the center since last visit: Has Dressing in Place as Prescribed: Yes Has Compression in Place as Prescribed: Yes Pain Present Now: No Electronic Signature(s) Signed: 04/26/2021 4:22:37 PM By: [name] RN Entered By: [name] on 04/26/2021 08:22:51 [name] ([id number]) -------------------------------------------------------------------------------- Clinic Level of Care Assessment Details Patient Name: [name] Date of Service: 04/26/2021 8:00 AM Medical Record Number: [id number] Patient Account Number: [id number] Date of Birth/Sex: [birth year] (70 y.o. M) Treating RN: [name] Primary Care Kimi Bordeau: Felipa Eth Other Clinician: Referring Jshaun Abernathy: Felipa Eth Treating Lorenzo Arscott/Extender: Skipper Cliche in Treatment:  12 Clinic Level of Care Assessment Items TOOL 4 Quantity Score X - Use when only an EandM is performed on FOLLOW-UP visit 1 0 ASSESSMENTS - Nursing Assessment / Reassessment X - Reassessment of Co-morbidities (includes updates in patient status) 1 10 X- 1 5 Reassessment of Adherence to Treatment Plan ASSESSMENTS - Wound and Skin Assessment / Reassessment []  - Simple Wound Assessment / Reassessment - one wound 0 X- 2 5 Complex Wound Assessment / Reassessment - multiple wounds []  - 0 Dermatologic / Skin Assessment (not related to wound area) ASSESSMENTS - Focused Assessment []  - Circumferential Edema Measurements - multi extremities 0 []  - 0 Nutritional Assessment / Counseling / Intervention []  - 0 Lower Extremity Assessment (monofilament, tuning fork, pulses) []  - 0 Peripheral Arterial Disease Assessment (using hand held doppler) ASSESSMENTS - Ostomy and/or Continence Assessment and Care []  - Incontinence Assessment and Management 0 []  - 0 Ostomy Care Assessment and Management (repouching, etc.) PROCESS - Coordination of Care X - Simple Patient / Family Education for ongoing care 1 15 []  - 0 Complex (extensive) Patient / Family Education for ongoing care []  - 0 Staff obtains Programmer, systems, Records, Test Results / Process Orders []  - 0 Staff telephones HHA, Nursing Homes / Clarify orders / etc []  - 0 Routine Transfer to another Facility (non-emergent condition) []  - 0 Routine Hospital Admission (non-emergent condition) []  - 0 New Admissions / Biomedical engineer / Ordering NPWT, Apligraf, etc. []  - 0 Emergency Hospital Admission (emergent condition) []  - 0 Simple Discharge Coordination X- 1 15 Complex (extensive) Discharge Coordination PROCESS - Special Needs []  - Pediatric / Minor Patient Management 0 []  - 0 Isolation Patient Management []  - 0 Hearing / Language / Visual special needs []  - 0 Assessment of Community assistance (transportation, D/C planning,  etc.) []  - 0 Additional assistance / Altered mentation []  - 0 Support Surface(s) Assessment (bed, cushion, seat, etc.) INTERVENTIONS - Wound Cleansing / Measurement [place], Norah ([id number]) []  - 0 Simple Wound Cleansing -  one wound X- 2 5 Complex Wound Cleansing - multiple wounds []  - 0 Wound Imaging (photographs - any number of wounds) X- 1 5 Wound Tracing (instead of photographs) []  - 0 Simple Wound Measurement - one wound X- 2 5 Complex Wound Measurement - multiple wounds INTERVENTIONS - Wound Dressings []  - Small Wound Dressing one or multiple wounds 0 X- 1 15 Medium Wound Dressing one or multiple wounds X- 1 20 Large Wound Dressing one or multiple wounds []  - 0 Application of Medications - topical []  - 0 Application of Medications - injection INTERVENTIONS - Miscellaneous []  - External ear exam 0 []  - 0 Specimen Collection (cultures, biopsies, blood, body fluids, etc.) []  - 0 Specimen(s) / Culture(s) sent or taken to Lab for analysis []  - 0 Patient Transfer (multiple staff / Civil Service fast streamer / Similar devices) []  - 0 Simple Staple / Suture removal (25 or less) []  - 0 Complex Staple / Suture removal (26 or more) []  - 0 Hypo / Hyperglycemic Management (close monitor of Blood Glucose) []  - 0 Ankle / Brachial Index (ABI) - do not check if billed separately X- 1 5 Vital Signs Has the patient been seen at the hospital within the last three years: Yes Total Score: 120 Level Of Care: New/Established - Level 4 Electronic Signature(s) Signed: 04/26/2021 4:22:37 PM By: [name] RN Entered By: [name] on 04/26/2021 08:58:23 [name] ([id number]) -------------------------------------------------------------------------------- Encounter Discharge Information Details Patient Name: [name] Date of Service: 04/26/2021 8:00 AM Medical Record Number: [id number] Patient Account Number: [id number] Date of Birth/Sex: [birth year] (70 y.o. M) Treating RN: [name] Primary Care Thaniel Coluccio: Felipa Eth Other Clinician: Referring Zia Kanner: Felipa Eth Treating Suvi Archuletta/Extender: Skipper Cliche in Treatment: 12 Encounter Discharge Information Items Discharge Condition: Stable Ambulatory Status: Cane Discharge Destination: Emergency Room Telephoned: Yes Spoke With: Vaughan Basta Transportation: Private Auto Accompanied By: self Schedule Follow-up Appointment: Yes Clinical Summary of Care: Patient Declined Electronic Signature(s) Signed: 04/26/2021 9:27:03 AM By: [name] RN Entered By: [name] on 04/26/2021 09:27:03 [name] ([id number]) -------------------------------------------------------------------------------- Lower Extremity Assessment Details Patient Name: [name] Date of Service: 04/26/2021 8:00 AM Medical Record Number: [id number] Patient Account Number: [id number] Date of Birth/Sex: 1950-11-15 (70 y.o. M) Treating RN: [name] Primary Care Ricco Dershem: Felipa Eth Other Clinician: Referring Antwuan Eckley: Felipa Eth Treating Daisuke Bailey/Extender: Jeri Cos Weeks in Treatment: 12 Edema Assessment Assessed: [Left: No] [Right: No] Edema: [Left: Yes] [Right: Yes] Calf Left: Right: Point of Measurement: 35 cm From Medial Instep 44 cm 48 cm Ankle Left: Right: Point of Measurement: 10 cm From Medial Instep 47 cm 36 cm Vascular Assessment Pulses: Dorsalis Pedis Palpable: [Left:Yes] [Right:Yes] Electronic Signature(s) Signed: 04/26/2021 4:22:37 PM By: [name] RN Entered By: [name] on 04/26/2021 08:28:34 [name] ([id number]) -------------------------------------------------------------------------------- Multi Wound Chart Details Patient Name: [name] Date of Service: 04/26/2021 8:00 AM Medical Record Number: [id number] Patient Account Number: [id number] Date of Birth/Sex: 02-14-1951 (70 y.o. M) Treating RN: [name] Primary Care Thais Silberstein: Felipa Eth Other  Clinician: Referring Sharmain Lastra: Felipa Eth Treating Bradleigh Sonnen/Extender: Skipper Cliche in Treatment: 12 Vital Signs Height(in): 73 Pulse(bpm): 90 Weight(lbs): 312 Blood Pressure(mmHg): 165/87 Body Mass Index(BMI): 41 Temperature(F): 97.9 Respiratory Rate(breaths/min): 18 Photos: [N/A:N/A] Wound Location: Left, Medial Lower Leg Left, Anterior Lower Leg N/A Wounding Event: Blister Blister N/A Primary Etiology: Lymphedema Lymphedema N/A Comorbid History: Hypertension, Peripheral Venous Hypertension, Peripheral Venous N/A Disease, Type II Diabetes, Disease, Type II Diabetes, Neuropathy, Received Neuropathy, Received Chemotherapy Chemotherapy Date Acquired: 03/08/2021 04/24/2021 N/A [name]  of Treatment: 7 0 N/A Wound Status: Open Open N/A Measurements L x W x D (cm) 16x27x0.1 5x9.5x0.1 N/A Area (cm) : 339.292 37.306 N/A Volume (cm) : 33.929 3.731 N/A % Reduction in Area: -4220.00% N/A N/A % Reduction in Volume: -764.00% N/A N/A Classification: Full Thickness Without Exposed Full Thickness Without Exposed N/A Support Structures Support Structures Exudate Amount: Large Large N/A Exudate Type: Serous Serous N/A Exudate Color: amber amber N/A Granulation Amount: Medium (34-66%) Medium (34-66%) N/A Granulation Quality: Pink Red N/A Necrotic Amount: Medium (34-66%) Medium (34-66%) N/A Exposed Structures: Fat Layer (Subcutaneous Tissue): Fat Layer (Subcutaneous Tissue): N/A Yes Yes Fascia: No Fascia: No Tendon: No Tendon: No Muscle: No Muscle: No Joint: No Joint: No Bone: No Bone: No Epithelialization: None None N/A Treatment Notes Electronic Signature(s) Signed: 04/26/2021 4:22:37 PM By: [name] RN Entered By: [name] on 04/26/2021 08:35:18 [name] ([id number]) -------------------------------------------------------------------------------- [place] Details Patient Name: [name] Date of Service: 04/26/2021 8:00  AM Medical Record Number: [id number] Patient Account Number: [id number] Date of Birth/Sex: Aug 05, 1950 (70 y.o. M) Treating RN: [name] Primary Care Blue Winther: Felipa Eth Other Clinician: Referring Amel Kitch: Felipa Eth Treating Sharece Fleischhacker/Extender: Skipper Cliche in Treatment: 12 Active Inactive Wound/Skin Impairment Nursing Diagnoses: Knowledge deficit related to ulceration/compromised skin integrity Goals: Patient/caregiver will verbalize understanding of skin care regimen Date Initiated: 01/26/2021 Target Resolution Date: 05/28/2021 Goal Status: Active Interventions: Assess patient/caregiver ability to obtain necessary supplies Assess patient/caregiver ability to perform ulcer/skin care regimen upon admission and as needed Assess ulceration(s) every visit Notes: Electronic Signature(s) Signed: 04/26/2021 4:22:37 PM By: [name] RN Entered By: [name] on 04/26/2021 08:35:06 [name] ([id number]) -------------------------------------------------------------------------------- Pain Assessment Details Patient Name: [name] Date of Service: 04/26/2021 8:00 AM Medical Record Number: [id number] Patient Account Number: [id number] Date of Birth/Sex: [birth year] (70 y.o. M) Treating RN: [name] Primary Care Emrah Ariola: Felipa Eth Other Clinician: Referring Shemika Robbs: Felipa Eth Treating Nyjai Graff/Extender: Skipper Cliche in Treatment: 12 Active Problems Location of Pain Severity and Description of Pain Patient Has Paino No Site Locations Pain Management and Medication Current Pain Management: Electronic Signature(s) Signed: 04/26/2021 4:22:37 PM By: [name] RN Entered By: [name] on 04/26/2021 08:23:33 [name] ([id number]) -------------------------------------------------------------------------------- Wound Assessment Details Patient Name: [name] Date of Service: 04/26/2021 8:00 AM Medical Record Number:  [id number] Patient Account Number: [id number] Date of Birth/Sex: 1950-06-27 (70 y.o. M) Treating RN: [name] Primary Care Elisama Thissen: Felipa Eth Other Clinician: Referring Meril Dray: Felipa Eth Treating Brynnlee Cumpian/Extender: Skipper Cliche in Treatment: 12 Wound Status Wound Number: 15 Primary Lymphedema Etiology: Wound Location: Left, Medial Lower Leg Wound Open Wounding Event: Blister Status: Date Acquired: 03/08/2021 Comorbid Hypertension, Peripheral Venous Disease, Type II Weeks Of Treatment: 7 History: Diabetes, Neuropathy, Received Chemotherapy Clustered Wound: No Photos Wound Measurements Length: (cm) 16 Width: (cm) 27 Depth: (cm) 0.1 Area: (cm) 339.292 Volume: (cm) 33.929 % Reduction in Area: -4220% % Reduction in Volume: -764% Epithelialization: None Tunneling: No Undermining: No Wound Description Classification: Full Thickness Without Exposed Support Structu Exudate Amount: Large Exudate Type: Serous Exudate Color: amber res Foul Odor After Cleansing: No Slough/Fibrino Yes Wound Bed Granulation Amount: Medium (34-66%) Exposed Structure Granulation Quality: Pink Fascia Exposed: No Necrotic Amount: Medium (34-66%) Fat Layer (Subcutaneous Tissue) Exposed: Yes Necrotic Quality: Adherent Slough Tendon Exposed: No Muscle Exposed: No Joint Exposed: No Bone Exposed: No Treatment Notes Wound #15 (Lower Leg) Wound Laterality: Left, Medial Cleanser Peri-Wound Care Topical Primary Dressing [place], [name] ([id number]) Kerlix AMD Roll Dressing, 4.5x 4.1 (  in/in) Secondary Dressing ABD Pad 5x9 (in/in) Discharge Instruction: Cover with ABD pad Secured With 90M Medipore H Soft Cloth Surgical Tape, 2x2 (in/yd) Compression Wrap Compression Stockings Add-Ons Electronic Signature(s) Signed: 04/26/2021 4:22:37 PM By: [name] RN Entered By: [name] on 04/26/2021 08:24:33 [name]  ([id number]) -------------------------------------------------------------------------------- Wound Assessment Details Patient Name: [name] Date of Service: 04/26/2021 8:00 AM Medical Record Number: [id number] Patient Account Number: [id number] Date of Birth/Sex: Apr 12, 1951 (70 y.o. M) Treating RN: [name] Primary Care Martel Galvan: Felipa Eth Other Clinician: Referring Cherl Gorney: Felipa Eth Treating Derian Pfost/Extender: Skipper Cliche in Treatment: 12 Wound Status Wound Number: 17 Primary Lymphedema Etiology: Wound Location: Left, Anterior Lower Leg Wound Open Wounding Event: Blister Status: Date Acquired: 04/24/2021 Comorbid Hypertension, Peripheral Venous Disease, Type II Weeks Of Treatment: 0 History: Diabetes, Neuropathy, Received Chemotherapy Clustered Wound: No Photos Wound Measurements Length: (cm) 5 Width: (cm) 9.5 Depth: (cm) 0.1 Area: (cm) 37.306 Volume: (cm) 3.731 % Reduction in Area: % Reduction in Volume: Epithelialization: None Tunneling: No Undermining: No Wound Description Classification: Full Thickness Without Exposed Support Structu Exudate Amount: Large Exudate Type: Serous Exudate Color: amber res Foul Odor After Cleansing: No Slough/Fibrino Yes Wound Bed Granulation Amount: Medium (34-66%) Exposed Structure Granulation Quality: Red Fascia Exposed: No Necrotic Amount: Medium (34-66%) Fat Layer (Subcutaneous Tissue) Exposed: Yes Necrotic Quality: Adherent Slough Tendon Exposed: No Muscle Exposed: No Joint Exposed: No Bone Exposed: No Treatment Notes Wound #17 (Lower Leg) Wound Laterality: Left, Anterior Cleanser Peri-Wound Care Topical Primary Dressing [name], [name] ([id number]) Secondary Dressing Secured With Compression Wrap Compression Stockings Add-Ons Electronic Signature(s) Signed: 04/26/2021 4:22:37 PM By: [name] RN Entered By: [name] on 04/26/2021 08:26:58 [name]  ([id number]) -------------------------------------------------------------------------------- Vitals Details Patient Name: [name] Date of Service: 04/26/2021 8:00 AM Medical Record Number: [id number] Patient Account Number: [id number] Date of Birth/Sex: 1950/10/27 (70 y.o. M) Treating RN: [name] Primary Care Maylie Ashton: Felipa Eth Other Clinician: Referring Daemian Gahm: Felipa Eth Treating Jazelyn Sipe/Extender: Skipper Cliche in Treatment: 12 Vital Signs Time Taken: 08:22 Temperature (F): 97.9 Height (in): 73 Pulse (bpm): 90 Weight (lbs): 312 Respiratory Rate (breaths/min): 18 Body Mass Index (BMI): 41.2 Blood Pressure (mmHg): 165/87 Reference Range: 80 - 120 mg / dl Electronic Signature(s) Signed: 04/26/2021 4:22:37 PM By: [name] RN Entered By: [name] on 04/26/2021 25:42:70

## 2021-04-27 NOTE — Consult Note (Signed)
[place] Nurse Consult Note: Reason for Consult: Patient well known to our team from previous admissions for lymphedema.  Seen by the outpatient wound care center once weekly and by [facility] twice weekly for Unna's boot applications.  Patient sits in chair with legs dependent.  Yesterday with new ruptured blister where proximal boot rubbed against skin with increasing edema.  Full thickness wound to left anterior LE (medical device related pressure injury), MDRPI. Buttock with chronic purple discoloration, chronic friction injury Wound type: venous insufficiency, lymphedema, full thickness Pressure Injury POA: Yes Measurement: Ruptured blister on LLE:  6cm x 10cm x 0.2cm Wound bed:dry, red Drainage (amount, consistency, odor) none Periwound: scaly, edematous consistent with lymphedema Dressing procedure/placement/frequency: At this time, patient is in bed with LEs elevated.  Topical wound care with antimicrobial nonadherent (xeroform) is indicated with dry boot application (Kerlix, ACE from toes to knees) with feet placed into Prevalon boots. We should take this opportunity to provide pressure redistribution to buttock which he does not get at home by turning and repositioning from side to side and minimizing time in the supine position to meals. A sacral prophylactic dressing is to be placed.  Recommend reestablishing schedule with [facility] (patient reports that they have not come for two weeks as [facility]) and outpatient wound care center once discharged.  We will continue the above POC as long as patient is in house.  [facility] nursing team will not follow, but will remain available to this patient, the nursing and medical teams.  Please re-consult if needed. Thanks, Maudie Flakes, MSN, RN, [place], Arther Abbott  Pager# [phone]

## 2021-04-27 NOTE — Progress Notes (Signed)
PROGRESS NOTE    [name]  CBJ:[id number] DOB: 05-11-1950 DOA: 04/26/2021 PCP: Felipa Eth, MD    Brief Narrative:  70 year old gentleman from home with history of peripheral vascular disease, hypertension, diabetes, chronic diastolic heart failure, CKD stage IIIa, severe lymphedema and venous insufficiency of bilateral leg, morbid obesity with BMI more than 40 presented with worsening leg wounds.  He does have bilateral venous ulcers for more than 2 years and followed at wound care clinic and treated with multiple rounds of antibiotics.  Now reportedly started noticing foul-smelling drainage from the open wounds, seen at wound care clinic and sent to ER.  In the emergency room, hemodynamically stable.   Assessment & Plan:   Principal Problem:   Wound infection Active Problems:   Type II diabetes mellitus with renal manifestations (HCC)   Essential hypertension   Hyperlipidemia   Morbid obesity ([place])   Peripheral vascular disease (HCC)   Chronic diastolic CHF (congestive heart failure) (HCC)   CKD (chronic kidney disease), stage IIIa   Iron deficiency anemia  Infected bilateral lower extremity venous ulcers: Significant bilateral leg swelling, erythema and ulcers.  Probably related to underlying lymphedema. Trial of broad-spectrum IV antibiotics.  Blood cultures pending. Lower extremity Dopplers. Wound care consultation. Vascular surgery was consulted for the recommendation.  Type 2 diabetes with renal manifestations: Known A1c 7.  Taking Actos and Amaryl at home.  On sliding scale insulin.  Essential hypertension: Blood pressure stable on lisinopril and hydralazine.  Dizzy on walking.  Decrease dose of lisinopril.  Hyperlipidemia Chronic diastolic heart failure CKD stage IIIa Chronic conditions anemia.  Stable and chronic.  DVT prophylaxis: enoxaparin (LOVENOX) injection 40 mg Start: 04/26/21 1530   Code Status: Full code Family Communication: None Disposition  Plan: Status is: Inpatient  Remains inpatient appropriate because: Significant infection of the legs.         Consultants:  Vascular surgery  Procedures:  None  Antimicrobials:  Vancomycin, Flagyl and Rocephin 12/19---   Subjective: Patient was seen and examined in the emergency room.  Mild pain in the both legs.  Got some dizzy episodes on walking.  Will decrease dose of antihypertensives.  Objective: Vitals:   04/27/21 0930 04/27/21 1015 04/27/21 1030 04/27/21 1132  BP: (!) 119/59 (!) 97/43 (!) 101/53 (!) 94/51  Pulse: 73  90   Resp: 15     Temp:      TempSrc:      SpO2: 96%  97%   Weight:      Height:        Intake/Output Summary (Last 24 hours) at 04/27/2021 1353 Last data filed at 04/27/2021 1607 Gross per 24 hour  Intake --  Output 1300 ml  Net -1300 ml   Filed Weights   04/26/21 1133  Weight: (!) 138.3 kg    Examination:  General exam: Appears calm and comfortable  Looks comfortable on room air. Respiratory system: Clear to auscultation. Respiratory effort normal. Cardiovascular system: S1 & S2 heard, RRR.  Gastrointestinal system: Obese and pendulous.  Bowel sound present.   Central nervous system: Alert and oriented. No focal neurological deficits. Extremities: Symmetric 5 x 5 power. Skin:  Massive bilateral lymphedema.  Discoloration of the skin. Open ulcer left shin with minimal drainage.    Data Reviewed: I have personally reviewed following labs and imaging studies  CBC: Recent Labs  Lab 04/26/21 1132  WBC 6.1  NEUTROABS 4.2  HGB 8.8*  HCT 29.8*  MCV 83.2  PLT 204   Basic  Metabolic Panel: Recent Labs  Lab 04/26/21 1132 04/27/21 0555  NA 140 140  K 3.8 3.8  CL 106 105  CO2 27 29  GLUCOSE 218* 131*  BUN 30* 27*  CREATININE 1.30* 1.48*  CALCIUM 9.0 8.6*  MG  --  1.8   GFR: Estimated Creatinine Clearance: 67.9 mL/min (A) (by C-G formula based on SCr of 1.48 mg/dL (H)). Liver Function Tests: Recent Labs  Lab  04/26/21 1132  AST 24  ALT 21  ALKPHOS 52  BILITOT 0.4  PROT 7.4  ALBUMIN 3.2*   No results for input(s): LIPASE, AMYLASE in the last 168 hours. No results for input(s): AMMONIA in the last 168 hours. Coagulation Profile: No results for input(s): INR, PROTIME in the last 168 hours. Cardiac Enzymes: No results for input(s): CKTOTAL, CKMB, CKMBINDEX, TROPONINI in the last 168 hours. BNP (last 3 results) No results for input(s): PROBNP in the last 8760 hours. HbA1C: No results for input(s): HGBA1C in the last 72 hours. CBG: Recent Labs  Lab 04/26/21 1641 04/26/21 2235 04/27/21 0733  GLUCAP 122* 141* 164*   Lipid Profile: No results for input(s): CHOL, HDL, LDLCALC, TRIG, CHOLHDL, LDLDIRECT in the last 72 hours. Thyroid Function Tests: No results for input(s): TSH, T4TOTAL, FREET4, T3FREE, THYROIDAB in the last 72 hours. Anemia Panel: No results for input(s): VITAMINB12, FOLATE, FERRITIN, TIBC, IRON, RETICCTPCT in the last 72 hours. Sepsis Labs: Recent Labs  Lab 04/26/21 1132 04/26/21 1349  LATICACIDVEN 1.4 1.4    Recent Results (from the past 240 hour(s))  Resp Panel by RT-PCR (Flu A&B, Covid) Nasopharyngeal Swab     Status: None   Collection Time: 04/26/21  2:43 PM   Specimen: Nasopharyngeal Swab; Nasopharyngeal(NP) swabs in vial transport medium  Result Value Ref Range Status   SARS Coronavirus 2 by RT PCR NEGATIVE NEGATIVE Final    Comment: (NOTE) SARS-CoV-2 target nucleic acids are NOT DETECTED.  The SARS-CoV-2 RNA is generally detectable in upper respiratory specimens during the acute phase of infection. The lowest concentration of SARS-CoV-2 viral copies this assay can detect is 138 copies/mL. A negative result does not preclude SARS-Cov-2 infection and should not be used as the sole basis for treatment or other patient management decisions. A negative result may occur with  improper specimen collection/handling, submission of specimen other than  nasopharyngeal swab, presence of viral mutation(s) within the areas targeted by this assay, and inadequate number of viral copies(<138 copies/mL). A negative result must be combined with clinical observations, patient history, and epidemiological information. The expected result is Negative.  Fact Sheet for Patients:  EntrepreneurPulse.com.au  Fact Sheet for Healthcare Providers:  IncredibleEmployment.be  This test is no t yet approved or cleared by the [place] FDA and  has been authorized for detection and/or diagnosis of SARS-CoV-2 by FDA under an Emergency Use Authorization (EUA). This EUA will remain  in effect (meaning this test can be used) for the duration of the COVID-19 declaration under Section 564(b)(1) of the Act, 21 U.S.C.section 360bbb-3(b)(1), unless the authorization is terminated  or revoked sooner.       Influenza A by PCR NEGATIVE NEGATIVE Final   Influenza B by PCR NEGATIVE NEGATIVE Final    Comment: (NOTE) The Xpert Xpress SARS-CoV-2/FLU/RSV plus assay is intended as an aid in the diagnosis of influenza from Nasopharyngeal swab specimens and should not be used as a sole basis for treatment. Nasal washings and aspirates are unacceptable for Xpert Xpress SARS-CoV-2/FLU/RSV testing.  Fact Sheet for Patients: EntrepreneurPulse.com.au  Fact Sheet for Healthcare Providers: IncredibleEmployment.be  This test is not yet approved or cleared by the [place] FDA and has been authorized for detection and/or diagnosis of SARS-CoV-2 by FDA under an Emergency Use Authorization (EUA). This EUA will remain in effect (meaning this test can be used) for the duration of the COVID-19 declaration under Section 564(b)(1) of the Act, 21 U.S.C. section 360bbb-3(b)(1), unless the authorization is terminated or revoked.  Performed at [facility], [address] [place], [place]  [address]   Culture, blood (Routine X 2) w Reflex to ID Panel     Status: None (Preliminary result)   Collection Time: 04/26/21  3:23 PM   Specimen: BLOOD  Result Value Ref Range Status   Specimen Description BLOOD BLOOD RIGHT HAND  Final   Special Requests   Final    BOTTLES DRAWN AEROBIC AND ANAEROBIC Blood Culture results may not be optimal due to an inadequate volume of blood received in culture bottles   Culture   Final    NO GROWTH < 12 HOURS Performed at [facility], [address] [place], [place] [address]    Report Status PENDING  Incomplete  Culture, blood (Routine X 2) w Reflex to ID Panel     Status: None (Preliminary result)   Collection Time: 04/26/21  3:35 PM   Specimen: BLOOD  Result Value Ref Range Status   Specimen Description BLOOD BLOOD LEFT HAND  Final   Special Requests   Final    BOTTLES DRAWN AEROBIC ONLY Blood Culture results may not be optimal due to an inadequate volume of blood received in culture bottles   Culture   Final    NO GROWTH < 12 HOURS Performed at [facility], [address] [place], [place] [address]    Report Status PENDING  Incomplete         Radiology Studies: US Venous Img Lower Bilateral (DVT)  Result Date: 04/26/2021 CLINICAL DATA:  Bilateral leg edema. Ulcerations. On anticoagulation therapy. EXAM: BILATERAL LOWER EXTREMITY VENOUS DOPPLER ULTRASOUND TECHNIQUE: Gray-scale sonography with graded compression, as well as color Doppler and duplex ultrasound were performed to evaluate the lower extremity deep venous systems from the level of the common femoral vein and including the common femoral, femoral, profunda femoral, popliteal and calf veins including the posterior tibial, peroneal and gastrocnemius veins when visible. The superficial great saphenous vein was also interrogated. Spectral Doppler was utilized to evaluate flow at rest and with distal augmentation maneuvers in the common femoral, femoral and  popliteal veins. COMPARISON:  Ultrasound venous left lower extremity 12/14/2018 FINDINGS: RIGHT LOWER EXTREMITY Common Femoral Vein: No evidence of thrombus. Normal compressibility, respiratory phasicity and response to augmentation. Saphenofemoral Junction: No evidence of thrombus. Normal compressibility and flow on color Doppler imaging. Profunda Femoral Vein: No evidence of thrombus. Normal compressibility and flow on color Doppler imaging. Femoral Vein: No evidence of thrombus. Normal compressibility, respiratory phasicity and response to augmentation. Popliteal Vein: No evidence of thrombus. Normal compressibility, respiratory phasicity and response to augmentation. Calf Veins: Not visualized Superficial Great Saphenous Vein: No evidence of thrombus. Normal compressibility. Venous Reflux:  None. Other Findings:  None. LEFT LOWER EXTREMITY Common Femoral Vein: No evidence of thrombus. Normal compressibility, respiratory phasicity and response to augmentation. Saphenofemoral Junction: No evidence of thrombus. Normal compressibility and flow on color Doppler imaging. Profunda Femoral Vein: No evidence of thrombus. Normal compressibility and flow on color Doppler imaging. Femoral Vein: No evidence of thrombus. Normal compressibility, respiratory  phasicity and response to augmentation. Popliteal Vein: No evidence of thrombus. Normal compressibility, respiratory phasicity and response to augmentation. Calf Veins: Not visualized. Superficial Great Saphenous Vein: No evidence of thrombus. Normal compressibility. Venous Reflux:  None. Other Findings:  Bilateral lower extremity edema. IMPRESSION: 1. No evidence of deep venous thrombosis in either lower extremity. 2. Please note the calf veins are not visualized/evaluated due to wounds and edema. Electronically Signed   By: Iven Finn M.D.   On: 04/26/2021 16:53        Scheduled Meds:  aspirin  81 mg Oral Daily   cholecalciferol  2,000 Units Oral Daily    enoxaparin (LOVENOX) injection  40 mg Subcutaneous Q24H   ferrous sulfate  325 mg Oral Daily   furosemide  40 mg Intravenous Q12H   hydrALAZINE  75 mg Oral Q8H   hydrocerin   Topical BID   insulin aspart  0-5 Units Subcutaneous QHS   insulin aspart  0-9 Units Subcutaneous TID WC   lisinopril  40 mg Oral Daily   multivitamin with minerals  1 tablet Oral Daily   nystatin   Topical Daily   omega-3 acid ethyl esters  1 g Oral Daily   pravastatin  40 mg Oral Daily   sertraline  200 mg Oral Daily   silver sulfADIAZINE   Topical Daily   Continuous Infusions:  ceFEPime (MAXIPIME) IV     cefTRIAXone (ROCEPHIN)  IV     metronidazole Stopped (04/27/21 0734)   vancomycin       LOS: 1 day    Time spent: 25 minutes    Barb Merino, MD [facility] Pager [phone]

## 2021-04-28 ENCOUNTER — Ambulatory Visit: Payer: PPO

## 2021-04-28 ENCOUNTER — Encounter: Payer: Self-pay | Admitting: Internal Medicine

## 2021-04-28 LAB — GLUCOSE, CAPILLARY
Glucose-Capillary: 214 mg/dL — ABNORMAL HIGH (ref 70–99)
Glucose-Capillary: 140 mg/dL — ABNORMAL HIGH (ref 70–99)
Glucose-Capillary: 153 mg/dL — ABNORMAL HIGH (ref 70–99)

## 2021-04-28 MED ORDER — CEFAZOLIN SODIUM-DEXTROSE 2-4 GM/100ML-% IV SOLN
2.0000 g | Freq: Three times a day (TID) | INTRAVENOUS | Status: DC
  Administered 2021-04-28 – 2021-04-29 (×3): 2 g via INTRAVENOUS
  Filled 2021-04-28 (×5): qty 100

## 2021-04-28 NOTE — Plan of Care (Signed)

## 2021-04-28 NOTE — Progress Notes (Signed)
Occupational Therapy Treatment Patient Details Name: [name] MRN: [id number] DOB: 24-Apr-1951 Today's Date: 04/28/2021   History of present illness 70 y.o. male with medical history significant of PVD, hypertension, hyperlipidemia, diabetes mellitus, dCHF, CKD, lymphedema, venous insufficiency, former smoker, iron deficiency anemia, who presents with significant bilateral leg swelling, erythema, and nonhealing wound in left leg.  Pending vascular surgeon consult   OT comments  Pt seen for OT tx this date to f/u re: safety with ADLs/ADL mobiltiy. Pt requires SUPV with straight cane for STS from bed and MOD A for LB bathing. He requires SETUP for MIN A for aspects of UB bathing/dressing in sitting, and MOD A for aspects up LB bathing/dressing in standing. He is able to progress to MIN A with peri care. He demos G static standing balance and F dynamic. Pt returned to bed end of session with all needs met and in reach. Will continue to follow.    Recommendations for follow up therapy are one component of a multi-disciplinary discharge planning process, led by the attending physician.  Recommendations may be updated based on patient status, additional functional criteria and insurance authorization.    Follow Up Recommendations  Home health OT    Assistance Recommended at Discharge Intermittent Supervision/Assistance  Equipment Recommendations  None recommended by OT    Recommendations for Other Services      Precautions / Restrictions Precautions Precautions: Fall Restrictions Weight Bearing Restrictions: No       Mobility Bed Mobility Overal bed mobility: Needs Assistance Bed Mobility: Supine to Sit     Supine to sit: Supervision;HOB elevated Sit to supine: Supervision;HOB elevated   General bed mobility comments: SUP for safety; increased time and effort. Bed features used.    Transfers Overall transfer level: Needs assistance Equipment used: Straight cane Transfers:  Sit to/from Stand Sit to Stand: Supervision;From elevated surface           General transfer comment: increased time     Balance Overall balance assessment: Needs assistance Sitting-balance support: No upper extremity supported;Feet supported   Sitting balance - Comments: Good on EOB and toilet   Standing balance support: No upper extremity supported;During functional activity Standing balance-Leahy Scale: Fair                             ADL either performed or assessed with clinical judgement   ADL Overall ADL's : Needs assistance/impaired         Upper Body Bathing: Set up;Sitting   Lower Body Bathing: Moderate assistance;Sit to/from stand   Upper Body Dressing : Set up;Sitting   Lower Body Dressing: Moderate assistance;Sitting/lateral leans Lower Body Dressing Details (indicate cue type and reason): clothing mgt over hips                    Extremity/Trunk Assessment Upper Extremity Assessment Upper Extremity Assessment: Overall WFL for tasks assessed   Lower Extremity Assessment Lower Extremity Assessment: Generalized weakness        Vision Baseline Vision/History: 1 Wears glasses Patient Visual Report: No change from baseline     Perception     Praxis      Cognition Arousal/Alertness: Awake/alert Behavior During Therapy: WFL for tasks assessed/performed Overall Cognitive Status: Within Functional Limits for tasks assessed  General Comments: A&Ox4          Exercises Other Exercises Other Exercises: Ot engages pt in bathinf/dressing tasks   Shoulder Instructions       General Comments      Pertinent Vitals/ Pain       Pain Assessment: No/denies pain  Home Living                                          Prior Functioning/Environment              Frequency  Min 2X/week        Progress Toward Goals  OT Goals(current goals can now be found in  the care plan section)  Progress towards OT goals: Progressing toward goals  Acute Rehab OT Goals Patient Stated Goal: to return home OT Goal Formulation: With patient Time For Goal Achievement: 05/11/21 Potential to Achieve Goals: Good  Plan Discharge plan remains appropriate    Co-evaluation                 AM-PAC OT "6 Clicks" Daily Activity     Outcome Measure   Help from another person eating meals?: None Help from another person taking care of personal grooming?: A Little Help from another person toileting, which includes using toliet, bedpan, or urinal?: A Little Help from another person bathing (including washing, rinsing, drying)?: A Lot Help from another person to put on and taking off regular upper body clothing?: None Help from another person to put on and taking off regular lower body clothing?: A Lot 6 Click Score: 18    End of Session Equipment Utilized During Treatment: Gait belt;Other (comment) (spc)  OT Visit Diagnosis: Unsteadiness on feet (R26.81);Muscle weakness (generalized) (M62.81)   Activity Tolerance Patient tolerated treatment well   Patient Left in bed;with call bell/phone within reach;with bed alarm set   Nurse Communication Mobility status        Time: 2233-6122 OT Time Calculation (min): 31 min  Charges: OT General Charges $OT Visit: 1 Visit OT Treatments $Self Care/Home Management : 8-22 mins $Therapeutic Activity: 8-22 mins  Gerrianne Scale, [place], OTR/L ascom [phone] 04/28/21, 5:44 PM

## 2021-04-28 NOTE — Progress Notes (Signed)
[name], [name] ([id number]) Visit Report for 04/23/2021 Physician Orders Details Patient Name: [name], [name] Date of Service: 04/23/2021 10:30 AM Medical Record Number: [id number] Patient Account Number: [id number] Date of Birth/Sex: Mar 06, 1951 (70 y.o. M) Treating RN: Levora Dredge Primary Care Provider: Felipa Eth Other Clinician: Referring Provider: Felipa Eth Treating Provider/Extender: Skipper Cliche in Treatment: 12 Verbal / Phone Orders: No Diagnosis Coding Follow-up Appointments o Return Appointment in 1 week. [place] for wound care. May utilize formulary equivalent dressing for wound treatment orders unless otherwise specified. Home Health Nurse may visit PRN to address patientos wound care needs. - frequency 3 times per week - patient will be seen once at wound center - home health to see patient 2 times per week on Wed and friday Bathing/ Shower/ Hygiene o May shower with wound dressing protected with water repellent cover or cast protector. Edema Control - Lymphedema / Segmental Compressive Device / Other Bilateral Lower Extremities o Optional: One layer of unna paste to top of compression wrap (to act as an anchor). - Unna paste on feet and calf to secure wrap in place o 4 Layer Compression System Lymphedema. - bi lat, apply zetuvits/ or formulary for extra absorbant dressing around ankles and foot area, zetuvit to blister on LLE 3 times per week COTTON LAYER SPIRAL, LIGHT TAN SPIRAL. WHITE WITH YELLOW LINE FIGURE 8 , COBAN SPIRAL o Elevate, Exercise Daily and Avoid Standing for Long Periods of Time. o Elevate legs to the level of the heart and pump ankles as often as possible o Elevate leg(s) parallel to the floor when sitting. o Compression Pump: Use compression pump on left lower extremity for 60 minutes, twice daily. - Start today o Compression Pump: Use compression pump on right lower extremity for 60  minutes, twice daily. - Start today o DO YOUR BEST to sleep in the bed at night. DO NOT sleep in your recliner. Long hours of sitting in a recliner leads to swelling of the legs and/or potential wounds on your backside. o Other: - Contact prescriber regarding use of diuretics to reduce fluid overload. Off-Loading Bilateral Lower Extremities o Open toe surgical shoe Wound Treatment Electronic Signature(s) Signed: 04/23/2021 5:41:04 PM By: Worthy Keeler PA-C Signed: 04/28/2021 12:56:33 PM By: Levora Dredge Previous Signature: 04/23/2021 1:24:28 PM Version By: Levora Dredge Entered By: Levora Dredge on 04/23/2021 13:28:25 [name] ([id number]) -------------------------------------------------------------------------------- SuperBill Details Patient Name: [name] Date of Service: 04/23/2021 Medical Record Number: [id number] Patient Account Number: [id number] Date of Birth/Sex: 15-Sep-1950 (70 y.o. M) Treating RN: Levora Dredge Primary Care Provider: Felipa Eth Other Clinician: Referring Provider: Felipa Eth Treating Provider/Extender: Skipper Cliche in Treatment: 12 Diagnosis Coding ICD-10 Codes Code Description E11.622 Type 2 diabetes mellitus with other skin ulcer I89.0 Lymphedema, not elsewhere classified I87.2 Venous insufficiency (chronic) (peripheral) L97.822 Non-pressure chronic ulcer of other part of left lower leg with fat layer exposed L97.512 Non-pressure chronic ulcer of other part of right foot with fat layer exposed I10 Essential (primary) hypertension L97.811 Non-pressure chronic ulcer of other part of right lower leg limited to breakdown of skin Facility Procedures CPT4: Description Modifier Quantity Code [id number] BILATERAL: Application of multi-layer venous compression system; leg (below knee), including 1 ankle and foot. Electronic Signature(s) Signed: 04/23/2021 1:29:29 PM By: Levora Dredge Signed: 04/23/2021 5:41:04  PM By: Worthy Keeler PA-C Entered By: Levora Dredge on 04/23/2021 13:29:28

## 2021-04-28 NOTE — Progress Notes (Signed)
GERARDO, CAIAZZO ([id number]) Visit Report for 04/23/2021 Arrival Information Details Patient Name: [name], [name] Date of Service: 04/23/2021 10:30 AM Medical Record Number: [id number] Patient Account Number: [id number] Date of Birth/Sex: [birth year] (70 y.o. M) Treating RN: Levora Dredge Primary Care Azlynn Mitnick: Felipa Eth Other Clinician: Referring Uchechukwu Dhawan: Felipa Eth Treating Bettye Sitton/Extender: Skipper Cliche in Treatment: 12 Visit Information History Since Last Visit Added or deleted any medications: No Patient Arrived: Cane Has Dressing in Place as Prescribed: Yes Arrival Time: 11:14 Has Compression in Place as Prescribed: Yes Accompanied By: self Pain Present Now: No Transfer Assistance: None Patient Identification Verified: Yes Secondary Verification Process Completed: Yes Patient Requires Transmission-Based Precautions: No Patient Has Alerts: No Electronic Signature(s) Signed: 04/23/2021 1:27:44 PM By: Levora Dredge Previous Signature: 04/23/2021 1:22:40 PM Version By: Levora Dredge Entered By: Levora Dredge on 04/23/2021 13:27:44 [name] ([id number]) -------------------------------------------------------------------------------- Compression Therapy Details Patient Name: [name] Date of Service: 04/23/2021 10:30 AM Medical Record Number: [id number] Patient Account Number: [id number] Date of Birth/Sex: Sep 17, 1950 (70 y.o. M) Treating RN: Levora Dredge Primary Care Skylen Spiering: Felipa Eth Other Clinician: Referring Ignatz Deis: Felipa Eth Treating Dema Timmons/Extender: Skipper Cliche in Treatment: 12 Compression Therapy Performed for Wound Assessment: Non-Wound Location Performed By: Clinician Levora Dredge, RN Compression Type: Four Layer Location: Lower Extremity, Bilateral Notes blister to proximal midline front of lower leg, pt states they pop up from time to time, will cover with non bordered zetuvit Electronic  Signature(s) Signed: 04/28/2021 12:56:33 PM By: Levora Dredge Entered By: Levora Dredge on 04/23/2021 11:38:32 [name] ([id number]) -------------------------------------------------------------------------------- Encounter Discharge Information Details Patient Name: [name] Date of Service: 04/23/2021 10:30 AM Medical Record Number: [id number] Patient Account Number: [id number] Date of Birth/Sex: May 24, 1950 (70 y.o. M) Treating RN: Levora Dredge Primary Care Waleed Dettman: Felipa Eth Other Clinician: Referring Zakar Brosch: Felipa Eth Treating Precious Gilchrest/Extender: Skipper Cliche in Treatment: 12 Encounter Discharge Information Items Discharge Condition: Stable Ambulatory Status: Cane Discharge Destination: Home Transportation: Private Auto Accompanied By: self Schedule Follow-up Appointment: Yes Clinical Summary of Care: Electronic Signature(s) Signed: 04/23/2021 1:29:08 PM By: Levora Dredge Entered By: Levora Dredge on 04/23/2021 13:29:08 [name] ([id number]) -------------------------------------------------------------------------------- Non-Wound Condition Assessment Details Patient Name: [name] Date of Service: 04/23/2021 10:30 AM Medical Record Number: [id number] Patient Account Number: [id number] Date of Birth/Sex: February 17, 1951 (70 y.o. M) Treating RN: Levora Dredge Primary Care Nthony Lefferts: Felipa Eth Other Clinician: Referring Maecyn Panning: Felipa Eth Treating Eleen Litz/Extender: Skipper Cliche in Treatment: 12 Non-Wound Condition: Condition: Lymphedema Location: Leg Side: Left Notes: Anterior aspect of lower leg Notes Blister noted on LLE, partially open and draining, patient states these appear from time to time and drain. Zetuvit placed over blister to help catch any further drainage Electronic Signature(s) Signed: 04/23/2021 1:27:32 PM By: Levora Dredge Previous Signature: 04/23/2021 1:27:19 PM Version By: Levora Dredge Previous Signature: 04/23/2021 1:26:36 PM Version By: Levora Dredge Entered By: Levora Dredge on 04/23/2021 13:27:32 [name] ([id number]) -------------------------------------------------------------------------------- Non-Wound Condition Assessment Details Patient Name: [name] Date of Service: 04/23/2021 10:30 AM Medical Record Number: [id number] Patient Account Number: [id number] Date of Birth/Sex: 1950/09/07 (70 y.o. M) Treating RN: Levora Dredge Primary Care Tovah Slavick: Felipa Eth Other Clinician: Referring Demetreus Lothamer: Felipa Eth Treating Evani Shrider/Extender: Jeri Cos Weeks in Treatment: 12 Non-Wound Condition: Condition: Lymphedema Location: Leg Side: Right Electronic Signature(s) Signed: 04/23/2021 1:27:37 PM By: Levora Dredge Previous Signature: 04/23/2021 1:27:07 PM Version By: Levora Dredge Entered By: Levora Dredge on 04/23/2021 13:27:36

## 2021-04-28 NOTE — Progress Notes (Signed)
PROGRESS NOTE    [name]  DZH:[id number] DOB: 1951-01-30 DOA: 04/26/2021 PCP: Felipa Eth, MD    Brief Narrative:  70 year old gentleman from home with history of peripheral vascular disease, hypertension, diabetes, chronic diastolic heart failure, CKD stage IIIa, severe lymphedema and venous insufficiency of bilateral leg, morbid obesity with BMI more than 40 presented with worsening leg wounds.  He does have bilateral venous ulcers for more than 2 years and followed at wound care clinic and treated with multiple rounds of antibiotics.  Now reportedly started noticing foul-smelling drainage from the open wounds, seen at wound care clinic and sent to ER.  In the emergency room, hemodynamically stable.   Assessment & Plan:   Principal Problem:   Wound infection Active Problems:   Type II diabetes mellitus with renal manifestations (HCC)   Essential hypertension   Hyperlipidemia   Morbid obesity ([place])   Peripheral vascular disease (HCC)   Chronic diastolic CHF (congestive heart failure) (HCC)   CKD (chronic kidney disease), stage IIIa   Iron deficiency anemia  Infected bilateral lower extremity venous ulcers: Significant bilateral leg swelling, erythema and ulcers.  Probably related to underlying lymphedema. Trial of broad-spectrum IV antibiotics.  Blood cultures negative so far. Lower extremity Dopplers negative for significant acute DVT. Vascular surgery was consulted for the recommendation.  Suggested local wound care. Seen by wound care, Unna boot applied. Patient will need very good wound care planning before discharge.  Type 2 diabetes with renal manifestations: Known A1c 7.  Taking Actos and Amaryl at home.  On sliding scale insulin.  Essential hypertension: Blood pressure stable on lisinopril and hydralazine.  Dizzy on walking.  Decrease dose of lisinopril with improvement of symptoms.  Hyperlipidemia Chronic diastolic heart failure CKD stage IIIa Chronic  conditions anemia.  Stable and chronic.  DVT prophylaxis: enoxaparin (LOVENOX) injection 40 mg Start: 04/26/21 1530   Code Status: Full code Family Communication: None Disposition Plan: Status is: Inpatient  Remains inpatient appropriate because: Significant infection of the legs. Patient without good support system and wound care as outpatient.         Consultants:  Vascular surgery  Procedures:  None  Antimicrobials:  Vancomycin, Flagyl and Rocephin 12/19---   Subjective:  Patient seen and examined.  Denies any complaints today.  He [facility] with [facility] to [facility] for his wound care visits. Objective: Vitals:   04/27/21 1730 04/27/21 1854 04/27/21 1956 04/28/21 0456  BP: (!) 123/55 120/60 (!) 132/54 107/64  Pulse: 63 71 64 94  Resp:  18 16 20   Temp:  98.2 F (36.8 C) 98.2 F (36.8 C) 98.2 F (36.8 C)  TempSrc:  Oral Oral Oral  SpO2: 96% 97% 98% 93%  Weight:      Height:        Intake/Output Summary (Last 24 hours) at 04/28/2021 1210 Last data filed at 04/28/2021 1055 Gross per 24 hour  Intake 1230.26 ml  Output 900 ml  Net 330.26 ml   Filed Weights   04/26/21 1133  Weight: (!) 138.3 kg    Examination:  General exam: Appears calm and comfortable  Looks comfortable on room air.  Not in any distress. Respiratory system: Clear to auscultation. Respiratory effort normal. Cardiovascular system: S1 & S2 heard, RRR.  Gastrointestinal system: Obese and pendulous.  Bowel sound present.   Central nervous system: Alert and oriented. No focal neurological deficits. Extremities: Symmetric 5 x 5 power. Skin:  Massive bilateral lymphedema.  Discoloration of the skin. Currently with [employer]  bilateral.    Data Reviewed: I have personally reviewed following labs and imaging studies  CBC: Recent Labs  Lab 04/26/21 1132  WBC 6.1  NEUTROABS 4.2  HGB 8.8*  HCT 29.8*  MCV 83.2  PLT 161   Basic Metabolic Panel: Recent Labs  Lab 04/26/21 1132  04/27/21 0555  NA 140 140  K 3.8 3.8  CL 106 105  CO2 27 29  GLUCOSE 218* 131*  BUN 30* 27*  CREATININE 1.30* 1.48*  CALCIUM 9.0 8.6*  MG  --  1.8   GFR: Estimated Creatinine Clearance: 67.9 mL/min (A) (by C-G formula based on SCr of 1.48 mg/dL (H)). Liver Function Tests: Recent Labs  Lab 04/26/21 1132  AST 24  ALT 21  ALKPHOS 52  BILITOT 0.4  PROT 7.4  ALBUMIN 3.2*   No results for input(s): LIPASE, AMYLASE in the last 168 hours. No results for input(s): AMMONIA in the last 168 hours. Coagulation Profile: No results for input(s): INR, PROTIME in the last 168 hours. Cardiac Enzymes: No results for input(s): CKTOTAL, CKMB, CKMBINDEX, TROPONINI in the last 168 hours. BNP (last 3 results) No results for input(s): PROBNP in the last 8760 hours. HbA1C: No results for input(s): HGBA1C in the last 72 hours. CBG: Recent Labs  Lab 04/26/21 2235 04/27/21 0733 04/27/21 1657 04/27/21 2024 04/28/21 1145  GLUCAP 141* 164* 142* 180* 214*   Lipid Profile: No results for input(s): CHOL, HDL, LDLCALC, TRIG, CHOLHDL, LDLDIRECT in the last 72 hours. Thyroid Function Tests: No results for input(s): TSH, T4TOTAL, FREET4, T3FREE, THYROIDAB in the last 72 hours. Anemia Panel: No results for input(s): VITAMINB12, FOLATE, FERRITIN, TIBC, IRON, RETICCTPCT in the last 72 hours. Sepsis Labs: Recent Labs  Lab 04/26/21 1132 04/26/21 1349  LATICACIDVEN 1.4 1.4    Recent Results (from the past 240 hour(s))  Resp Panel by RT-PCR (Flu A&B, Covid) Nasopharyngeal Swab     Status: None   Collection Time: 04/26/21  2:43 PM   Specimen: Nasopharyngeal Swab; Nasopharyngeal(NP) swabs in vial transport medium  Result Value Ref Range Status   SARS Coronavirus 2 by RT PCR NEGATIVE NEGATIVE Final    Comment: (NOTE) SARS-CoV-2 target nucleic acids are NOT DETECTED.  The SARS-CoV-2 RNA is generally detectable in upper respiratory specimens during the acute phase of infection. The  lowest concentration of SARS-CoV-2 viral copies this assay can detect is 138 copies/mL. A negative result does not preclude SARS-Cov-2 infection and should not be used as the sole basis for treatment or other patient management decisions. A negative result may occur with  improper specimen collection/handling, submission of specimen other than nasopharyngeal swab, presence of viral mutation(s) within the areas targeted by this assay, and inadequate number of viral copies(<138 copies/mL). A negative result must be combined with clinical observations, patient history, and epidemiological information. The expected result is Negative.  Fact Sheet for Patients:  EntrepreneurPulse.com.au  Fact Sheet for Healthcare Providers:  IncredibleEmployment.be  This test is no t yet approved or cleared by the [place] FDA and  has been authorized for detection and/or diagnosis of SARS-CoV-2 by FDA under an Emergency Use Authorization (EUA). This EUA will remain  in effect (meaning this test can be used) for the duration of the COVID-19 declaration under Section 564(b)(1) of the Act, 21 U.S.C.section 360bbb-3(b)(1), unless the authorization is terminated  or revoked sooner.       Influenza A by PCR NEGATIVE NEGATIVE Final   Influenza B by PCR NEGATIVE NEGATIVE Final  Comment: (NOTE) The Xpert Xpress SARS-CoV-2/FLU/RSV plus assay is intended as an aid in the diagnosis of influenza from Nasopharyngeal swab specimens and should not be used as a sole basis for treatment. Nasal washings and aspirates are unacceptable for Xpert Xpress SARS-CoV-2/FLU/RSV testing.  Fact Sheet for Patients: EntrepreneurPulse.com.au  Fact Sheet for Healthcare Providers: IncredibleEmployment.be  This test is not yet approved or cleared by the [place] FDA and has been authorized for detection and/or diagnosis of SARS-CoV-2 by FDA under  an Emergency Use Authorization (EUA). This EUA will remain in effect (meaning this test can be used) for the duration of the COVID-19 declaration under Section 564(b)(1) of the Act, 21 U.S.C. section 360bbb-3(b)(1), unless the authorization is terminated or revoked.  Performed at [facility], [address] [place], [place] [address]   Culture, blood (Routine X 2) w Reflex to ID Panel     Status: None (Preliminary result)   Collection Time: 04/26/21  3:23 PM   Specimen: BLOOD  Result Value Ref Range Status   Specimen Description BLOOD BLOOD RIGHT HAND  Final   Special Requests   Final    BOTTLES DRAWN AEROBIC AND ANAEROBIC Blood Culture results may not be optimal due to an inadequate volume of blood received in culture bottles   Culture   Final    NO GROWTH < 12 HOURS Performed at [facility], [address], [place], [place] [address]    Report Status PENDING  Incomplete  Culture, blood (Routine X 2) w Reflex to ID Panel     Status: None (Preliminary result)   Collection Time: 04/26/21  3:35 PM   Specimen: BLOOD  Result Value Ref Range Status   Specimen Description BLOOD BLOOD LEFT HAND  Final   Special Requests   Final    BOTTLES DRAWN AEROBIC ONLY Blood Culture results may not be optimal due to an inadequate volume of blood received in culture bottles   Culture   Final    NO GROWTH < 12 HOURS Performed at [facility], [address] [place], [place] [address]    Report Status PENDING  Incomplete         Radiology Studies: US Venous Img Lower Bilateral (DVT)  Result Date: 04/26/2021 CLINICAL DATA:  Bilateral leg edema. Ulcerations. On anticoagulation therapy. EXAM: BILATERAL LOWER EXTREMITY VENOUS DOPPLER ULTRASOUND TECHNIQUE: Gray-scale sonography with graded compression, as well as color Doppler and duplex ultrasound were performed to evaluate the lower extremity deep venous systems from the level of the common femoral vein and  including the common femoral, femoral, profunda femoral, popliteal and calf veins including the posterior tibial, peroneal and gastrocnemius veins when visible. The superficial great saphenous vein was also interrogated. Spectral Doppler was utilized to evaluate flow at rest and with distal augmentation maneuvers in the common femoral, femoral and popliteal veins. COMPARISON:  Ultrasound venous left lower extremity 12/14/2018 FINDINGS: RIGHT LOWER EXTREMITY Common Femoral Vein: No evidence of thrombus. Normal compressibility, respiratory phasicity and response to augmentation. Saphenofemoral Junction: No evidence of thrombus. Normal compressibility and flow on color Doppler imaging. Profunda Femoral Vein: No evidence of thrombus. Normal compressibility and flow on color Doppler imaging. Femoral Vein: No evidence of thrombus. Normal compressibility, respiratory phasicity and response to augmentation. Popliteal Vein: No evidence of thrombus. Normal compressibility, respiratory phasicity and response to augmentation. Calf Veins: Not visualized Superficial Great Saphenous Vein: No evidence of thrombus. Normal compressibility. Venous Reflux:  None. Other Findings:  None. LEFT LOWER EXTREMITY Common Femoral  Vein: No evidence of thrombus. Normal compressibility, respiratory phasicity and response to augmentation. Saphenofemoral Junction: No evidence of thrombus. Normal compressibility and flow on color Doppler imaging. Profunda Femoral Vein: No evidence of thrombus. Normal compressibility and flow on color Doppler imaging. Femoral Vein: No evidence of thrombus. Normal compressibility, respiratory phasicity and response to augmentation. Popliteal Vein: No evidence of thrombus. Normal compressibility, respiratory phasicity and response to augmentation. Calf Veins: Not visualized. Superficial Great Saphenous Vein: No evidence of thrombus. Normal compressibility. Venous Reflux:  None. Other Findings:  Bilateral lower extremity  edema. IMPRESSION: 1. No evidence of deep venous thrombosis in either lower extremity. 2. Please note the calf veins are not visualized/evaluated due to wounds and edema. Electronically Signed   By: Iven Finn M.D.   On: 04/26/2021 16:53        Scheduled Meds:  aspirin  81 mg Oral Daily   cholecalciferol  2,000 Units Oral Daily   enoxaparin (LOVENOX) injection  40 mg Subcutaneous Q24H   ferrous sulfate  325 mg Oral Daily   furosemide  40 mg Intravenous Q12H   hydrALAZINE  75 mg Oral Q8H   hydrocerin   Topical BID   insulin aspart  0-5 Units Subcutaneous QHS   insulin aspart  0-9 Units Subcutaneous TID WC   lisinopril  20 mg Oral Daily   multivitamin with minerals  1 tablet Oral Daily   nystatin   Topical Daily   omega-3 acid ethyl esters  1 g Oral Daily   pravastatin  40 mg Oral Daily   sertraline  200 mg Oral Daily   silver sulfADIAZINE   Topical Daily   Continuous Infusions:  cefTRIAXone (ROCEPHIN)  IV 2 g (04/27/21 1819)   metronidazole 500 mg (04/28/21 0515)   vancomycin Stopped (04/27/21 2026)     LOS: 2 days    Time spent: 25 minutes    Barb Merino, MD [facility] Pager [phone]

## 2021-04-28 NOTE — Progress Notes (Signed)
Physical Therapy Treatment Patient Details Name: Russel Morain MRN: [id number] DOB: January 10, 1951 Today's Date: 04/28/2021   History of Present Illness 70 y.o. male with medical history significant of PVD, hypertension, hyperlipidemia, diabetes mellitus, dCHF, CKD, lymphedema, venous insufficiency, former smoker, iron deficiency anemia, who presents with significant bilateral leg swelling, erythema, and nonhealing wound in left leg.  Pending vascular surgeon consult    PT Comments    Pt received sitting up in bed, his cousin and RN in room. Pt agreeable to therapy. PT assisted pt to restroom prior to ambulating. Bed mobility remains SUP, STS has improved to SUP from an elevated surface. He does require MOD A for lift to stand from a standard height toilet with BUE support on grab bar. Pt ambulated 24ft using SPC, distance limited by PT after lightheadedness was reported following yesterday's longer distance. Pt reports he feels well after session; seated comfortably in recliner with BLE elevated for lymphedema control. All needs in reach. Would benefit from skilled PT to address above deficits and promote optimal return to PLOF.   Recommendations for follow up therapy are one component of a multi-disciplinary discharge planning process, led by the attending physician.  Recommendations may be updated based on patient status, additional functional criteria and insurance authorization.  Follow Up Recommendations  Home health PT     Assistance Recommended at Discharge PRN  Equipment Recommendations  None recommended by PT    Recommendations for Other Services       Precautions / Restrictions Precautions Precautions: Fall Restrictions Weight Bearing Restrictions: No     Mobility  Bed Mobility Overal bed mobility: Needs Assistance Bed Mobility: Supine to Sit     Supine to sit: Supervision;HOB elevated     General bed mobility comments: SUP for safety; increased time and effort. Bed  features used.    Transfers Overall transfer level: Needs assistance Equipment used: Straight cane Transfers: Sit to/from Stand Sit to Stand: Supervision;From elevated surface           General transfer comment: SUP from elevated EOB. Pt does require MOD A for lift to stand up from low surface (standard) toilet.    Ambulation/Gait Ambulation/Gait assistance: Min guard Gait Distance (Feet): 200 Feet Assistive device: Straight cane Gait Pattern/deviations: Step-through pattern;Wide base of support Gait velocity: decreased     General Gait Details: Light CGA for safety. No LOB. Pt maintained steady gait velocity.   Stairs             Wheelchair Mobility    Modified Rankin (Stroke Patients Only)       Balance Overall balance assessment: Needs assistance Sitting-balance support: No upper extremity supported;Feet supported Sitting balance-Leahy Scale: Good Sitting balance - Comments: Good on EOB and toilet   Standing balance support: No upper extremity supported;During functional activity Standing balance-Leahy Scale: Fair Standing balance comment: Uses SPC during ambulation. Able to perform standing hygiene with trunk rotation with no UE support.                            Cognition Arousal/Alertness: Awake/alert Behavior During Therapy: WFL for tasks assessed/performed Overall Cognitive Status: Within Functional Limits for tasks assessed                                          Exercises      General Comments  Pertinent Vitals/Pain Pain Assessment: No/denies pain    Home Living                          Prior Function            PT Goals (current goals can now be found in the care plan section) Acute Rehab PT Goals Patient Stated Goal: to go home PT Goal Formulation: With patient Time For Goal Achievement: 05/11/21 Potential to Achieve Goals: Good    Frequency    Min 2X/week      PT  Plan      Co-evaluation              AM-PAC PT "6 Clicks" Mobility   Outcome Measure  Help needed turning from your back to your side while in a flat bed without using bedrails?: None Help needed moving from lying on your back to sitting on the side of a flat bed without using bedrails?: None Help needed moving to and from a bed to a chair (including a wheelchair)?: A Little Help needed standing up from a chair using your arms (e.g., wheelchair or bedside chair)?: A Little Help needed to walk in hospital room?: A Little Help needed climbing 3-5 steps with a railing? : A Little 6 Click Score: 20    End of Session Equipment Utilized During Treatment: Gait belt Activity Tolerance: Patient tolerated treatment well Patient left: with call bell/phone within reach;in chair (Pt sitting EOB for hand off to OT.) Nurse Communication: Mobility status PT Visit Diagnosis: Other abnormalities of gait and mobility (R26.89);Muscle weakness (generalized) (M62.81);History of falling (Z91.81);Difficulty in walking, not elsewhere classified (R26.2)     Time: 2751-7001 PT Time Calculation (min) (ACUTE ONLY): 27 min  Charges:  $Gait Training: 8-22 mins $Therapeutic Activity: 8-22 mins                     Patrina Levering PT, DPT 04/28/21 12:31 PM [phone]

## 2021-04-29 LAB — GLUCOSE, CAPILLARY
Glucose-Capillary: 172 mg/dL — ABNORMAL HIGH (ref 70–99)
Glucose-Capillary: 204 mg/dL — ABNORMAL HIGH (ref 70–99)
Glucose-Capillary: 166 mg/dL — ABNORMAL HIGH (ref 70–99)

## 2021-04-29 MED ORDER — DOXYCYCLINE MONOHYDRATE 100 MG PO TABS
100.0000 mg | ORAL_TABLET | Freq: Two times a day (BID) | ORAL | 0 refills | Status: AC
Start: 2021-04-29 — End: 2021-05-09

## 2021-04-29 MED ORDER — CEPHALEXIN 500 MG PO CAPS
500.0000 mg | ORAL_CAPSULE | Freq: Three times a day (TID) | ORAL | 0 refills | Status: AC
Start: 2021-04-29 — End: 2021-05-09

## 2021-04-29 MED ORDER — ACETAMINOPHEN 325 MG PO TABS
650.0000 mg | ORAL_TABLET | Freq: Four times a day (QID) | ORAL | Status: DC | PRN
  Administered 2021-04-29: 06:00:00 650 mg via ORAL

## 2021-04-29 MED ORDER — ACETAMINOPHEN 325 MG PO TABS
ORAL_TABLET | ORAL | Status: AC
  Administered 2021-04-29: 07:00:00 650 mg via ORAL
  Filled 2021-04-29: qty 2

## 2021-04-29 NOTE — Plan of Care (Signed)
VSS, patient alert and oriented.  IV's removed, discharge instructions reviewed and patient discharged to home

## 2021-04-29 NOTE — Discharge Summary (Signed)
Physician Discharge Summary  Amaury Kuzel QTM:[id number] DOB: Apr 13, 1951 DOA: 04/26/2021  PCP: Felipa Eth, MD  Admit date: 04/26/2021 Discharge date: 04/29/2021  Admitted From: Home Disposition: Home with home health  Recommendations for Outpatient Follow-up:  Follow up with PCP in 1-2 weeks Wound care to follow-up at home  Home Health: PT/OT/wound care Equipment/Devices: Present at home  Discharge Condition: Fair CODE STATUS: Full code Diet recommendation: Low-salt and low-carb diet  Discharge summary: 70 year old gentleman from home with history of peripheral vascular disease, hypertension, diabetes, chronic diastolic heart failure, CKD stage IIIa, severe lymphedema and venous insufficiency of bilateral leg, morbid obesity with BMI more than 40 presented with worsening leg wounds.  He does have bilateral venous ulcers for more than 2 years and followed at wound care clinic and treated with multiple rounds of antibiotics.  Now reportedly started noticing foul-smelling drainage from the open wounds, seen at wound care clinic and sent to ER.  In the emergency room, hemodynamically stable.  # Infected bilateral lower extremity venous ulcers: Significant bilateral leg swelling, erythema and ulcers.  Probably related to underlying lymphedema. Trial of broad-spectrum IV antibiotics.  Blood cultures negative so far. Lower extremity Dopplers negative for significant acute DVT. Vascular surgery was consulted for the recommendation.  Suggested local wound care. Overlying infection mostly related to chronic lymphedema. Needing compression, wound care. Will change to oral antibiotics with doxycycline and Keflex for 10 days. Wound care to follow-up at home and help with dressing changes.   Type 2 diabetes with renal manifestations: Known A1c 7.  Taking Actos and Amaryl at home.  Resume.  Essential hypertension: Blood pressure stable on lisinopril and hydralazine.     Hyperlipidemia Chronic diastolic heart failure CKD stage IIIa Chronic conditions anemia.  Stable and chronic.  Patient is medically stable.  Will need regular wound care follow-up at home and at wound care clinic.  This will be arranged via home health agencies through the [place].  Stable to discharge once wound care arrangements are made.    Discharge Diagnoses:  Principal Problem:   Wound infection Active Problems:   Type II diabetes mellitus with renal manifestations (HCC)   Essential hypertension   Hyperlipidemia   Morbid obesity ([place])   Peripheral vascular disease (HCC)   Chronic diastolic CHF (congestive heart failure) (HCC)   CKD (chronic kidney disease), stage IIIa   Iron deficiency anemia    Discharge Instructions  Discharge Instructions     Call MD for:  severe uncontrolled pain   Complete by: As directed    Diet - low sodium heart healthy   Complete by: As directed    Diet Carb Modified   Complete by: As directed    Discharge wound care:   Complete by: As directed    Wound care to right LE:  Wash leg and foot with soap and water, rinse and pat dry. Apply Eucerin cream.  Wrap from just below toes to just below knee with Kerlix roll gauze, top Kerlix with ACE bandage wrapped in a similar manner.  Use two kerlix and ACE bandages to ensure that bandages reach to just below knee.  Place foot/feet into Prevalon boots.  Wound care to left LE anterior aspect full thickness wound:  Cleanse with NS, pat dry. Apply a folded layer of xeroform gauze to the area of tissue loss, top with dry gauze, ABD and secure with Kerlix roll gauze wrapped from just below toes to just below knee. Cover Kerlix with ACE bandage wrapped in a  similar manner.  Use two Kerlix and two ACE bandages to ensure wraps reach to just below knee.  Place foot/feet into Prevalon boots.   Increase activity slowly   Complete by: As directed       Allergies as of 04/29/2021   No Known Allergies       Medication List     TAKE these medications    aspirin 81 MG chewable tablet Chew 81 mg by mouth daily.   bisoprolol 5 MG tablet Commonly known as: ZEBETA Take 1 tablet by mouth daily.   cephALEXin 500 MG capsule Commonly known as: KEFLEX Take 1 capsule (500 mg total) by mouth 3 (three) times daily for 10 days.   Cholecalciferol 25 MCG (1000 UT) tablet Take 2 tablets by mouth daily.   doxycycline 100 MG tablet Commonly known as: ADOXA Take 1 tablet (100 mg total) by mouth 2 (two) times daily for 10 days.   Ensure Max Protein Liqd Take 330 mLs (11 oz total) by mouth 2 (two) times daily.   ferrous sulfate 325 (65 FE) MG EC tablet Take 325 mg by mouth daily.   Fish Oil 1000 MG Caps Take 1 capsule by mouth daily.   glimepiride 1 MG tablet Commonly known as: AMARYL Take 1 tablet by mouth daily.   hydrALAZINE 25 MG tablet Commonly known as: APRESOLINE Take 75 mg by mouth in the morning, at noon, and at bedtime.   lisinopril 40 MG tablet Commonly known as: ZESTRIL TAKE ONE TABLET BY MOUTH EVERY DAY FOR BLOOD PRESSURE **REPLACES LISINOPRIL/HCTZ   multivitamin with minerals Tabs tablet Take 1 tablet by mouth daily.   nystatin powder Commonly known as: MYCOSTATIN/NYSTOP Apply to toe crevices with unna wrap changes   pioglitazone 15 MG tablet Commonly known as: ACTOS Take 15 mg by mouth daily.   pravastatin 80 MG tablet Commonly known as: PRAVACHOL Take 40 mg by mouth daily. daily   sertraline 100 MG tablet Commonly known as: ZOLOFT Take 100 mg by mouth daily.   silver sulfADIAZINE 1 % cream Commonly known as: SILVADENE Apply to left ankle with unna wrap changes               Discharge Care Instructions  (From admission, onward)           Start     Ordered   04/29/21 0000  Discharge wound care:       Comments: Wound care to right LE:  Wash leg and foot with soap and water, rinse and pat dry. Apply Eucerin cream.  Wrap from just below toes to  just below knee with Kerlix roll gauze, top Kerlix with ACE bandage wrapped in a similar manner.  Use two kerlix and ACE bandages to ensure that bandages reach to just below knee.  Place foot/feet into Prevalon boots.  Wound care to left LE anterior aspect full thickness wound:  Cleanse with NS, pat dry. Apply a folded layer of xeroform gauze to the area of tissue loss, top with dry gauze, ABD and secure with Kerlix roll gauze wrapped from just below toes to just below knee. Cover Kerlix with ACE bandage wrapped in a similar manner.  Use two Kerlix and two ACE bandages to ensure wraps reach to just below knee.  Place foot/feet into Prevalon boots.   04/29/21 1317            Follow-up Information     Felipa Eth, MD Follow up in 2 week(s).   Specialty: Internal Medicine  Contact information: [place] [address] [phone]                No Known Allergies  Consultations: Vascular surgery   Procedures/Studies: US Venous Img Lower Bilateral (DVT)  Result Date: 04/26/2021 CLINICAL DATA:  Bilateral leg edema. Ulcerations. On anticoagulation therapy. EXAM: BILATERAL LOWER EXTREMITY VENOUS DOPPLER ULTRASOUND TECHNIQUE: Gray-scale sonography with graded compression, as well as color Doppler and duplex ultrasound were performed to evaluate the lower extremity deep venous systems from the level of the common femoral vein and including the common femoral, femoral, profunda femoral, popliteal and calf veins including the posterior tibial, peroneal and gastrocnemius veins when visible. The superficial great saphenous vein was also interrogated. Spectral Doppler was utilized to evaluate flow at rest and with distal augmentation maneuvers in the common femoral, femoral and popliteal veins. COMPARISON:  Ultrasound venous left lower extremity 12/14/2018 FINDINGS: RIGHT LOWER EXTREMITY Common Femoral Vein: No evidence of thrombus. Normal compressibility, respiratory phasicity and  response to augmentation. Saphenofemoral Junction: No evidence of thrombus. Normal compressibility and flow on color Doppler imaging. Profunda Femoral Vein: No evidence of thrombus. Normal compressibility and flow on color Doppler imaging. Femoral Vein: No evidence of thrombus. Normal compressibility, respiratory phasicity and response to augmentation. Popliteal Vein: No evidence of thrombus. Normal compressibility, respiratory phasicity and response to augmentation. Calf Veins: Not visualized Superficial Great Saphenous Vein: No evidence of thrombus. Normal compressibility. Venous Reflux:  None. Other Findings:  None. LEFT LOWER EXTREMITY Common Femoral Vein: No evidence of thrombus. Normal compressibility, respiratory phasicity and response to augmentation. Saphenofemoral Junction: No evidence of thrombus. Normal compressibility and flow on color Doppler imaging. Profunda Femoral Vein: No evidence of thrombus. Normal compressibility and flow on color Doppler imaging. Femoral Vein: No evidence of thrombus. Normal compressibility, respiratory phasicity and response to augmentation. Popliteal Vein: No evidence of thrombus. Normal compressibility, respiratory phasicity and response to augmentation. Calf Veins: Not visualized. Superficial Great Saphenous Vein: No evidence of thrombus. Normal compressibility. Venous Reflux:  None. Other Findings:  Bilateral lower extremity edema. IMPRESSION: 1. No evidence of deep venous thrombosis in either lower extremity. 2. Please note the calf veins are not visualized/evaluated due to wounds and edema. Electronically Signed   By: Iven Finn M.D.   On: 04/26/2021 16:53   ECHOCARDIOGRAM COMPLETE  Result Date: 04/14/2021    ECHOCARDIOGRAM REPORT   Patient Name:   EDMAN LIPSEY Date of Exam: 04/14/2021 Medical Rec #:  [id number]    Height:       73.0 in Accession #:    [id number]   Weight:       314.0 lb Date of Birth:  1951-02-13    BSA:          2.606 m Patient Age:    40 years      BP:           158/68 mmHg Patient Gender: M            HR:           66 bpm. Exam Location:  [facility] Procedure: 2D Echo, Cardiac Doppler, Color Doppler and Intracardiac            Opacification Agent Indications:    R60.0 Lower extremity edema  History:        Patient has no prior history of Echocardiogram examinations.                 PAD, Signs/Symptoms:Edema and Hypertensive Heart Disease; Risk  Factors:Former Smoker and Dyslipidemia.  Sonographer:    Pilar Jarvis RDMS, RVT, RDCS Referring Phys: [place]  1. Left ventricular ejection fraction, by estimation, is 55 to 60%. The left ventricle has normal function. The left ventricle has no regional wall motion abnormalities. There is mild left ventricular hypertrophy. Left ventricular diastolic parameters are consistent with Grade II diastolic dysfunction (pseudonormalization).  2. Right ventricular systolic function is normal. The right ventricular size is not well visualized.  3. Left atrial size was mildly dilated.  4. The mitral valve is normal in structure. Trivial mitral valve regurgitation.  5. The aortic valve is tricuspid. Aortic valve regurgitation is not visualized. FINDINGS  Left Ventricle: Left ventricular ejection fraction, by estimation, is 55 to 60%. The left ventricle has normal function. The left ventricle has no regional wall motion abnormalities. Definity contrast agent was given IV to delineate the left ventricular  endocardial borders. The left ventricular internal cavity size was normal in size. There is mild left ventricular hypertrophy. Left ventricular diastolic parameters are consistent with Grade II diastolic dysfunction (pseudonormalization). Right Ventricle: The right ventricular size is not well visualized. No increase in right ventricular wall thickness. Right ventricular systolic function is normal. Left Atrium: Left atrial size was mildly dilated. Right Atrium: Right atrial size was  normal in size. Pericardium: There is no evidence of pericardial effusion. Mitral Valve: The mitral valve is normal in structure. Trivial mitral valve regurgitation. Tricuspid Valve: The tricuspid valve is not well visualized. Tricuspid valve regurgitation is not demonstrated. Aortic Valve: The aortic valve is tricuspid. Aortic valve regurgitation is not visualized. Aortic valve mean gradient measures 6.0 mmHg. Aortic valve peak gradient measures 12.7 mmHg. Aortic valve area, by VTI measures 3.39 cm. Pulmonic Valve: The pulmonic valve was normal in structure. Pulmonic valve regurgitation is not visualized. Aorta: The aortic root is normal in size and structure. Venous: The inferior vena cava was not well visualized. IAS/Shunts: No atrial level shunt detected by color flow Doppler.  LEFT VENTRICLE PLAX 2D LVIDd:         5.70 cm   Diastology LVIDs:         4.00 cm   LV e' medial:    6.64 cm/s LV PW:         1.10 cm   LV E/e' medial:  15.8 LV IVS:        1.10 cm   LV e' lateral:   7.40 cm/s LVOT diam:     2.30 cm   LV E/e' lateral: 14.2 LV SV:         139 LV SV Index:   53 LVOT Area:     4.15 cm  RIGHT VENTRICLE RV S prime:     14.00 cm/s TAPSE (M-mode): 2.9 cm LEFT ATRIUM            Index LA diam:      5.10 cm  1.96 cm/m LA Vol (A4C): 117.0 ml 44.89 ml/m  AORTIC VALVE                     PULMONIC VALVE AV Area (Vmax):    3.22 cm      PV Vmax:       1.17 m/s AV Area (Vmean):   3.29 cm      PV Peak grad:  5.5 mmHg AV Area (VTI):     3.39 cm AV Vmax:           178.00 cm/s AV  Vmean:          116.000 cm/s AV VTI:            0.411 m AV Peak Grad:      12.7 mmHg AV Mean Grad:      6.0 mmHg LVOT Vmax:         138.00 cm/s LVOT Vmean:        91.800 cm/s LVOT VTI:          0.335 m LVOT/AV VTI ratio: 0.82  AORTA Ao Root diam: 3.50 cm Ao Arch diam: 2.8 cm MITRAL VALVE MV Area (PHT): 4.89 cm     SHUNTS MV Decel Time: 155 msec     Systemic VTI:  0.34 m MV E velocity: 105.00 cm/s  Systemic Diam: 2.30 cm MV A velocity: 87.00  cm/s MV E/A ratio:  1.21 Kate Sable MD Electronically signed by Kate Sable MD Signature Date/Time: 04/14/2021/5:43:13 PM    Final    (Echo, Carotid, EGD, Colonoscopy, ERCP)    Subjective: Patient seen and examined.  He wants to go home when possible.  He has limitation of changing dressing himself.  Lives alone.  He is looking forward to have wound care at home with the help of home health.  Remains afebrile.   Discharge Exam: Vitals:   04/29/21 0521 04/29/21 0845  BP: 126/62 (!) 109/54  Pulse: 95 88  Resp: 14 16  Temp: 98.4 F (36.9 C) 97.8 F (36.6 C)  SpO2: 94% 93%   Vitals:   04/28/21 2011 04/28/21 2330 04/29/21 0521 04/29/21 0845  BP: 122/60 125/61 126/62 (!) 109/54  Pulse: 69  95 88  Resp: 16  14 16   Temp: 98.2 F (36.8 C)  98.4 F (36.9 C) 97.8 F (36.6 C)  TempSrc: Oral  Oral Oral  SpO2: 96%  94% 93%  Weight:      Height:        General: Pt is alert, awake, not in acute distress Laying flat on room air.  Not in any distress. Cardiovascular: RRR, S1/S2 +, no rubs, no gallops Respiratory: CTA bilaterally, no wheezing, no rhonchi Abdominal: Soft, NT, ND, bowel sounds + Extremities:  Massive edematous legs with chronic stasis changes, open ulceration shin of left leg. Nodularity and skin thickening dorsum of both feet and toes.    The results of significant diagnostics from this hospitalization (including imaging, microbiology, ancillary and laboratory) are listed below for reference.     Microbiology: Recent Results (from the past 240 hour(s))  Resp Panel by RT-PCR (Flu A&B, Covid) Nasopharyngeal Swab     Status: None   Collection Time: 04/26/21  2:43 PM   Specimen: Nasopharyngeal Swab; Nasopharyngeal(NP) swabs in vial transport medium  Result Value Ref Range Status   SARS Coronavirus 2 by RT PCR NEGATIVE NEGATIVE Final    Comment: (NOTE) SARS-CoV-2 target nucleic acids are NOT DETECTED.  The SARS-CoV-2 RNA is generally detectable in upper  respiratory specimens during the acute phase of infection. The lowest concentration of SARS-CoV-2 viral copies this assay can detect is 138 copies/mL. A negative result does not preclude SARS-Cov-2 infection and should not be used as the sole basis for treatment or other patient management decisions. A negative result may occur with  improper specimen collection/handling, submission of specimen other than nasopharyngeal swab, presence of viral mutation(s) within the areas targeted by this assay, and inadequate number of viral copies(<138 copies/mL). A negative result must be combined with clinical observations, patient history, and epidemiological information. The  expected result is Negative.  Fact Sheet for Patients:  EntrepreneurPulse.com.au  Fact Sheet for Healthcare Providers:  IncredibleEmployment.be  This test is no t yet approved or cleared by the [place] FDA and  has been authorized for detection and/or diagnosis of SARS-CoV-2 by FDA under an Emergency Use Authorization (EUA). This EUA will remain  in effect (meaning this test can be used) for the duration of the COVID-19 declaration under Section 564(b)(1) of the Act, 21 U.S.C.section 360bbb-3(b)(1), unless the authorization is terminated  or revoked sooner.       Influenza A by PCR NEGATIVE NEGATIVE Final   Influenza B by PCR NEGATIVE NEGATIVE Final    Comment: (NOTE) The Xpert Xpress SARS-CoV-2/FLU/RSV plus assay is intended as an aid in the diagnosis of influenza from Nasopharyngeal swab specimens and should not be used as a sole basis for treatment. Nasal washings and aspirates are unacceptable for Xpert Xpress SARS-CoV-2/FLU/RSV testing.  Fact Sheet for Patients: EntrepreneurPulse.com.au  Fact Sheet for Healthcare Providers: IncredibleEmployment.be  This test is not yet approved or cleared by the [place] FDA and has been  authorized for detection and/or diagnosis of SARS-CoV-2 by FDA under an Emergency Use Authorization (EUA). This EUA will remain in effect (meaning this test can be used) for the duration of the COVID-19 declaration under Section 564(b)(1) of the Act, 21 U.S.C. section 360bbb-3(b)(1), unless the authorization is terminated or revoked.  Performed at [facility], [address] [place], [place] [address]   Culture, blood (Routine X 2) w Reflex to ID Panel     Status: None (Preliminary result)   Collection Time: 04/26/21  3:23 PM   Specimen: BLOOD  Result Value Ref Range Status   Specimen Description BLOOD BLOOD RIGHT HAND  Final   Special Requests   Final    BOTTLES DRAWN AEROBIC AND ANAEROBIC Blood Culture results may not be optimal due to an inadequate volume of blood received in culture bottles   Culture   Final    NO GROWTH 3 DAYS Performed at [facility], [address], [place], [place] [address]    Report Status PENDING  Incomplete  Culture, blood (Routine X 2) w Reflex to ID Panel     Status: None (Preliminary result)   Collection Time: 04/26/21  3:35 PM   Specimen: BLOOD  Result Value Ref Range Status   Specimen Description BLOOD BLOOD LEFT HAND  Final   Special Requests   Final    BOTTLES DRAWN AEROBIC ONLY Blood Culture results may not be optimal due to an inadequate volume of blood received in culture bottles   Culture   Final    NO GROWTH 3 DAYS Performed at [facility], [address], [place], [place] [address]    Report Status PENDING  Incomplete     Labs: BNP (last 3 results) Recent Labs    12/03/20 1824 04/26/21 1534  BNP 63.8 76.7   Basic Metabolic Panel: Recent Labs  Lab 04/26/21 1132 04/27/21 0555  NA 140 140  K 3.8 3.8  CL 106 105  CO2 27 29  GLUCOSE 218* 131*  BUN 30* 27*  CREATININE 1.30* 1.48*  CALCIUM 9.0 8.6*  MG  --  1.8   Liver Function Tests: Recent Labs  Lab 04/26/21 1132  AST 24  ALT 21   ALKPHOS 52  BILITOT 0.4  PROT 7.4  ALBUMIN 3.2*   No results for input(s): LIPASE, AMYLASE in the last 168 hours. No results for  input(s): AMMONIA in the last 168 hours. CBC: Recent Labs  Lab 04/26/21 1132  WBC 6.1  NEUTROABS 4.2  HGB 8.8*  HCT 29.8*  MCV 83.2  PLT 204   Cardiac Enzymes: No results for input(s): CKTOTAL, CKMB, CKMBINDEX, TROPONINI in the last 168 hours. BNP: Invalid input(s): POCBNP CBG: Recent Labs  Lab 04/28/21 1145 04/28/21 1617 04/28/21 2315 04/29/21 0842 04/29/21 1151  GLUCAP 214* 140* 153* 172* 204*   D-Dimer No results for input(s): DDIMER in the last 72 hours. Hgb A1c No results for input(s): HGBA1C in the last 72 hours. Lipid Profile No results for input(s): CHOL, HDL, LDLCALC, TRIG, CHOLHDL, LDLDIRECT in the last 72 hours. Thyroid function studies No results for input(s): TSH, T4TOTAL, T3FREE, THYROIDAB in the last 72 hours.  Invalid input(s): FREET3 Anemia work up No results for input(s): VITAMINB12, FOLATE, FERRITIN, TIBC, IRON, RETICCTPCT in the last 72 hours. Urinalysis No results found for: COLORURINE, APPEARANCEUR, [place], [place], GLUCOSEU, [place], [place], [place], PROTEINUR, UROBILINOGEN, NITRITE, LEUKOCYTESUR Sepsis Labs Invalid input(s): PROCALCITONIN,  WBC,  LACTICIDVEN Microbiology Recent Results (from the past 240 hour(s))  Resp Panel by RT-PCR (Flu A&B, Covid) Nasopharyngeal Swab     Status: None   Collection Time: 04/26/21  2:43 PM   Specimen: Nasopharyngeal Swab; Nasopharyngeal(NP) swabs in vial transport medium  Result Value Ref Range Status   SARS Coronavirus 2 by RT PCR NEGATIVE NEGATIVE Final    Comment: (NOTE) SARS-CoV-2 target nucleic acids are NOT DETECTED.  The SARS-CoV-2 RNA is generally detectable in upper respiratory specimens during the acute phase of infection. The lowest concentration of SARS-CoV-2 viral copies this assay can detect is 138 copies/mL. A negative result does not preclude  SARS-Cov-2 infection and should not be used as the sole basis for treatment or other patient management decisions. A negative result may occur with  improper specimen collection/handling, submission of specimen other than nasopharyngeal swab, presence of viral mutation(s) within the areas targeted by this assay, and inadequate number of viral copies(<138 copies/mL). A negative result must be combined with clinical observations, patient history, and epidemiological information. The expected result is Negative.  Fact Sheet for Patients:  EntrepreneurPulse.com.au  Fact Sheet for Healthcare Providers:  IncredibleEmployment.be  This test is no t yet approved or cleared by the [place] FDA and  has been authorized for detection and/or diagnosis of SARS-CoV-2 by FDA under an Emergency Use Authorization (EUA). This EUA will remain  in effect (meaning this test can be used) for the duration of the COVID-19 declaration under Section 564(b)(1) of the Act, 21 U.S.C.section 360bbb-3(b)(1), unless the authorization is terminated  or revoked sooner.       Influenza A by PCR NEGATIVE NEGATIVE Final   Influenza B by PCR NEGATIVE NEGATIVE Final    Comment: (NOTE) The Xpert Xpress SARS-CoV-2/FLU/RSV plus assay is intended as an aid in the diagnosis of influenza from Nasopharyngeal swab specimens and should not be used as a sole basis for treatment. Nasal washings and aspirates are unacceptable for Xpert Xpress SARS-CoV-2/FLU/RSV testing.  Fact Sheet for Patients: EntrepreneurPulse.com.au  Fact Sheet for Healthcare Providers: IncredibleEmployment.be  This test is not yet approved or cleared by the [place] FDA and has been authorized for detection and/or diagnosis of SARS-CoV-2 by FDA under an Emergency Use Authorization (EUA). This EUA will remain in effect (meaning this test can be used) for the duration of  the COVID-19 declaration under Section 564(b)(1) of the Act, 21 U.S.C. section 360bbb-3(b)(1), unless the authorization is terminated or  revoked.  Performed at [facility], [address] [place], [place] [address]   Culture, blood (Routine X 2) w Reflex to ID Panel     Status: None (Preliminary result)   Collection Time: 04/26/21  3:23 PM   Specimen: BLOOD  Result Value Ref Range Status   Specimen Description BLOOD BLOOD RIGHT HAND  Final   Special Requests   Final    BOTTLES DRAWN AEROBIC AND ANAEROBIC Blood Culture results may not be optimal due to an inadequate volume of blood received in culture bottles   Culture   Final    NO GROWTH 3 DAYS Performed at [facility], [address] [place], [place] [address]    Report Status PENDING  Incomplete  Culture, blood (Routine X 2) w Reflex to ID Panel     Status: None (Preliminary result)   Collection Time: 04/26/21  3:35 PM   Specimen: BLOOD  Result Value Ref Range Status   Specimen Description BLOOD BLOOD LEFT HAND  Final   Special Requests   Final    BOTTLES DRAWN AEROBIC ONLY Blood Culture results may not be optimal due to an inadequate volume of blood received in culture bottles   Culture   Final    NO GROWTH 3 DAYS Performed at [facility], [address] [address] [place], [place] [id number]    Report Status PENDING  Incomplete     Time coordinating discharge:  35 minutes  SIGNED:   Barb Merino, MD  [facility] 04/29/2021, 1:17 PM

## 2021-04-29 NOTE — TOC Transition Note (Signed)
Transition of Care [facility]) - CM/SW Discharge Note   Patient Details  Name: Kalvin Buss MRN: [id number] Date of Birth: 1951/03/30  Transition of Care [facility]) CM/SW Contact:  Candie Chroman, LCSW Phone Number: 04/29/2021, 2:42 PM   Clinical Narrative:   Patient has orders to discharge home today. He was active with [facility] for home health nursing prior to admission. They got a new VA authorization on Monday for nursing and will try to get another authorization for PT and OT. No further concerns. CSW signing off.  Final next level of care: [place] Barriers to Discharge: No Barriers Identified   Patient Goals and CMS Choice     Choice offered to / list presented to : NA  Discharge Placement                    Patient and family notified of of transfer: 04/29/21  Discharge Plan and Services                          HH Arranged: RN, PT, OT [facility] Agency: [place] Date [place]: 04/29/21   Representative spoke with at [place]: Adela Lank  Social Determinants of Health (SDOH) Interventions     Readmission Risk Interventions No flowsheet data found.

## 2021-04-30 ENCOUNTER — Other Ambulatory Visit: Payer: Self-pay

## 2021-04-30 ENCOUNTER — Telehealth: Payer: Self-pay | Admitting: Cardiovascular Disease

## 2021-04-30 DIAGNOSIS — I5040 Unspecified combined systolic (congestive) and diastolic (congestive) heart failure: Secondary | ICD-10-CM | POA: Diagnosis not present

## 2021-04-30 DIAGNOSIS — I110 Hypertensive heart disease with heart failure: Secondary | ICD-10-CM | POA: Diagnosis not present

## 2021-04-30 DIAGNOSIS — I872 Venous insufficiency (chronic) (peripheral): Secondary | ICD-10-CM | POA: Diagnosis not present

## 2021-04-30 DIAGNOSIS — L97811 Non-pressure chronic ulcer of other part of right lower leg limited to breakdown of skin: Secondary | ICD-10-CM | POA: Diagnosis not present

## 2021-04-30 DIAGNOSIS — I5032 Chronic diastolic (congestive) heart failure: Secondary | ICD-10-CM

## 2021-04-30 DIAGNOSIS — I890 Lymphedema, not elsewhere classified: Secondary | ICD-10-CM | POA: Diagnosis not present

## 2021-04-30 DIAGNOSIS — E11622 Type 2 diabetes mellitus with other skin ulcer: Secondary | ICD-10-CM | POA: Diagnosis present

## 2021-04-30 DIAGNOSIS — L97512 Non-pressure chronic ulcer of other part of right foot with fat layer exposed: Secondary | ICD-10-CM | POA: Diagnosis not present

## 2021-04-30 DIAGNOSIS — L97822 Non-pressure chronic ulcer of other part of left lower leg with fat layer exposed: Secondary | ICD-10-CM | POA: Diagnosis not present

## 2021-04-30 MED ORDER — FUROSEMIDE 20 MG PO TABS: 40.0000 mg | ORAL_TABLET | Freq: Every day | ORAL | 1 refills | Status: AC

## 2021-04-30 NOTE — Progress Notes (Signed)
[name], [name] ([id number]) Visit Report for 04/30/2021 Physician Orders Details Patient Name: [name], [name] Date of Service: 04/30/2021 8:30 AM Medical Record Number: [id number] Patient Account Number: [id number] Date of Birth/Sex: 06/22/1950 (70 y.o. M) Treating RN: Donnamarie Poag Primary Care Provider: Felipa Eth Other Clinician: Referring Provider: Felipa Eth Treating Provider/Extender: Skipper Cliche in Treatment: 36 Verbal / Phone Orders: No Diagnosis Coding Follow-up Appointments o Return Appointment in 1 week. o Nurse Visit as needed [place] for wound care. May utilize formulary equivalent dressing for wound treatment orders unless otherwise specified. Home Health Nurse may visit PRN to address patientos wound care needs. - frequency 3 times per week - patient will be seen once at wound center - home health to see patient 2 times per week Bathing/ Shower/ Hygiene o May shower with wound dressing protected with water repellent cover or cast protector. o No tub bath. Edema Control - Lymphedema / Segmental Compressive Device / Other Bilateral Lower Extremities o Optional: One layer of unna paste to top of compression wrap (to act as an anchor). - Unna paste on feet and calf to secure wrap in place o 4 Layer Compression System Lymphedema. - bilateral legs bi lat, apply zetuvits/ or formulary for extra absorbant dressing around ankles and foot area, 3 times per week COTTON LAYER SPIRAL, LIGHT TAN SPIRAL. WHITE WITH YELLOW LINE FIGURE 8 , COBAN SPIRAL o Elevate, Exercise Daily and Avoid Standing for Long Periods of Time. o Elevate legs to the level of the heart and pump ankles as often as possible o Elevate leg(s) parallel to the floor when sitting. o Compression Pump: Use compression pump on left lower extremity for 60 minutes, twice daily. - Start today o Compression Pump: Use  compression pump on right lower extremity for 60 minutes, twice daily. - Start today o DO YOUR BEST to sleep in the bed at night. DO NOT sleep in your recliner. Long hours of sitting in a recliner leads to swelling of the legs and/or potential wounds on your backside. o Other: - Contact prescriber regarding use of diuretics to reduce fluid overload. Non-Wound Condition o Additional non-wound orders/instructions: - Xeroform to open areas back of left calf and top of right lower leg anterior Off-Loading Bilateral Lower Extremities o Open toe surgical shoe Wound Treatment Wound #15 - Lower Leg Wound Laterality: Left, Medial Cleanser: Soap and Water 3 x Per Week/30 Days Discharge Instructions: Gently cleanse wound with antibacterial soap, rinse and pat dry prior to dressing wounds Compression Wrap: Medichoice 4 layer Compression System, 35-40 mmHG 3 x Per Week/30 Days Discharge Instructions: Apply multi-layer wrap as directed. Wound #17 - Lower Leg Wound Laterality: Left, Anterior Cleanser: Soap and Water 3 x Per Week/30 Days Discharge Instructions: Gently cleanse wound with antibacterial soap, rinse and pat dry prior to dressing wounds Compression Wrap: Medichoice 4 layer Compression System, 35-40 mmHG 3 x Per Week/30 Days Discharge Instructions: Apply multi-layer wrap as directed. [name], [name] ([id number]) Electronic Signature(s) Signed: 04/30/2021 11:51:56 AM By: Donnamarie Poag Signed: 04/30/2021 12:10:22 PM By: Worthy Keeler PA-C Entered By: Donnamarie Poag on 04/30/2021 09:04:47 [name] ([id number]) -------------------------------------------------------------------------------- [place] Details Patient Name: [name] Date of Service: 04/30/2021 Medical Record Number: [id number] Patient Account Number: [id number] Date of Birth/Sex: July 29, 1950 (70 y.o. M) Treating RN: Donnamarie Poag Primary Care Provider: Felipa Eth Other Clinician: Referring Provider: Felipa Eth Treating Provider/Extender: Skipper Cliche in Treatment: 13 Diagnosis Coding ICD-10 Codes Code  Description E11.622 Type 2 diabetes mellitus with other skin ulcer I89.0 Lymphedema, not elsewhere classified I87.2 Venous insufficiency (chronic) (peripheral) L97.822 Non-pressure chronic ulcer of other part of left lower leg with fat layer exposed L97.512 Non-pressure chronic ulcer of other part of right foot with fat layer exposed I10 Essential (primary) hypertension L97.811 Non-pressure chronic ulcer of other part of right lower leg limited to breakdown of skin Facility Procedures CPT4: Description Modifier Quantity Code [id number] BILATERAL: Application of multi-layer venous compression system; leg (below knee), including 1 ankle and foot. Electronic Signature(s) Signed: 04/30/2021 11:51:56 AM By: Donnamarie Poag Signed: 04/30/2021 12:10:22 PM By: Worthy Keeler PA-C Entered By: Donnamarie Poag on 04/30/2021 09:05:42

## 2021-04-30 NOTE — Telephone Encounter (Signed)
Patient was discharged from hospital yesterday was taking 40mg  of Lasix am & pm. No lasix order or possasium. Patient has 20mg  at  home no potassium will have order. Please assist

## 2021-04-30 NOTE — Progress Notes (Signed)
[name], [name] ([id number]) Visit Report for 04/30/2021 Arrival Information Details Patient Name: [name], [name] Date of Service: 04/30/2021 8:30 AM Medical Record Number: [id number] Patient Account Number: [id number] Date of Birth/Sex: 1951/04/12 (70 y.o. M) Treating RN: Donnamarie Poag Primary Care Aleenah Homen: Felipa Eth Other Clinician: Referring Ariana Juul: Felipa Eth Treating Naileah Karg/Extender: Skipper Cliche in Treatment: 64 Visit Information History Since Last Visit Added or deleted any medications: No Patient Arrived: [name] Had a fall or experienced change in No Arrival Time: 08:38 activities of daily living that may affect Accompanied By: self risk of falls: Transfer Assistance: None Hospitalized since last visit: Yes Patient Identification Verified: Yes Has Dressing in Place as Prescribed: Yes Secondary Verification Process Completed: Yes Has Compression in Place as Prescribed: Yes Patient Requires Transmission-Based No Has Footwear/Offloading in Place as Prescribed: Yes Precautions: Pain Present Now: No Patient Has Alerts: Yes Patient Alerts: AVVS consult on file Last ABI-R 1.09; L 1.04 Electronic Signature(s) Signed: 04/30/2021 11:51:56 AM By: Donnamarie Poag Entered By: Donnamarie Poag on 04/30/2021 09:12:43 [name] ([id number]) -------------------------------------------------------------------------------- Compression Therapy Details Patient Name: [name] Date of Service: 04/30/2021 8:30 AM Medical Record Number: [id number] Patient Account Number: [id number] Date of Birth/Sex: 08-Mar-1951 (70 y.o. M) Treating RN: Donnamarie Poag Primary Care Shirl Ludington: Felipa Eth Other Clinician: Referring Jaeson Molstad: Felipa Eth Treating Brysten Reister/Extender: Skipper Cliche in Treatment: 13 Compression Therapy Performed for Wound Assessment: Wound #17 Left,Anterior Lower Leg Performed By: Junius Argyle, RN Compression Type: Four Layer Electronic  Signature(s) Signed: 04/30/2021 11:51:56 AM By: Donnamarie Poag Entered By: Donnamarie Poag on 04/30/2021 08:59:40 [name] ([id number]) -------------------------------------------------------------------------------- Compression Therapy Details Patient Name: [name] Date of Service: 04/30/2021 8:30 AM Medical Record Number: [id number] Patient Account Number: [id number] Date of Birth/Sex: 1950/12/01 (70 y.o. M) Treating RN: Donnamarie Poag Primary Care Kearsten Ginther: Felipa Eth Other Clinician: Referring Emira Eubanks: Felipa Eth Treating Deovion Batrez/Extender: Skipper Cliche in Treatment: 13 Compression Therapy Performed for Wound Assessment: NonWound Condition Lymphedema - Right Leg Performed By: Junius Argyle, RN Compression Type: Four Layer Electronic Signature(s) Signed: 04/30/2021 11:51:56 AM By: Donnamarie Poag Entered By: Donnamarie Poag on 04/30/2021 08:59:54 [name] ([id number]) -------------------------------------------------------------------------------- Encounter Discharge Information Details Patient Name: [name] Date of Service: 04/30/2021 8:30 AM Medical Record Number: [id number] Patient Account Number: [id number] Date of Birth/Sex: May 18, 1950 (70 y.o. M) Treating RN: Donnamarie Poag Primary Care Mollie Rossano: Felipa Eth Other Clinician: Referring Lorrena Goranson: Felipa Eth Treating Novice Vrba/Extender: Skipper Cliche in Treatment: 13 Encounter Discharge Information Items Discharge Condition: Stable Ambulatory Status: Cane Discharge Destination: Home Transportation: Other Accompanied By: self Schedule Follow-up Appointment: Yes Clinical Summary of Care: Electronic Signature(s) Signed: 04/30/2021 11:51:56 AM By: Donnamarie Poag Entered By: Donnamarie Poag on 04/30/2021 09:05:26 [name] ([id number]) -------------------------------------------------------------------------------- Wound Assessment Details Patient Name: [name] Date of Service:  04/30/2021 8:30 AM Medical Record Number: [id number] Patient Account Number: [id number] Date of Birth/Sex: [birth year] (70 y.o. M) Treating RN: Donnamarie Poag Primary Care Raschelle Wisenbaker: Felipa Eth Other Clinician: Referring Chasyn Cinque: Felipa Eth Treating Lanita Stammen/Extender: Skipper Cliche in Treatment: 13 Wound Status Wound Number: 15 Primary Lymphedema Etiology: Wound Location: Left, Medial Lower Leg Wound Open Wounding Event: Blister Status: Date Acquired: 03/08/2021 Comorbid Hypertension, Peripheral Venous Disease, Type II Weeks Of Treatment: 7 History: Diabetes, Neuropathy, Received Chemotherapy Clustered Wound: No Wound Measurements Length: (cm) 16 Width: (cm) 27 Depth: (cm) 0.1 Area: (cm) 339.292 Volume: (cm) 33.929 % Reduction in Area: -4220% % Reduction in Volume: -764% Epithelialization: None Wound Description Classification: Full Thickness Without Exposed Support Structu Exudate Amount: Large Exudate Type:  Serous Exudate Color: amber res Foul Odor After Cleansing: No Slough/Fibrino Yes Wound Bed Granulation Amount: Medium (34-66%) Exposed Structure Granulation Quality: Pink Fascia Exposed: No Necrotic Amount: Medium (34-66%) Fat Layer (Subcutaneous Tissue) Exposed: Yes Necrotic Quality: Adherent Slough Tendon Exposed: No Muscle Exposed: No Joint Exposed: No Bone Exposed: No Treatment Notes Wound #15 (Lower Leg) Wound Laterality: Left, Medial Cleanser Soap and Water Discharge Instruction: Gently cleanse wound with antibacterial soap, rinse and pat dry prior to dressing wounds Peri-Wound Care Topical Primary Dressing Secondary Dressing Secured With Compression Wrap Medichoice 4 layer Compression System, 35-40 mmHG Discharge Instruction: Apply multi-layer wrap as directed. Compression Stockings Add-Ons KARANVEER, RAMAKRISHNAN ([id number]) Electronic Signature(s) Signed: 04/30/2021 11:51:56 AM By: Donnamarie Poag Entered By: Donnamarie Poag on 04/30/2021  08:59:08 [name] ([id number]) -------------------------------------------------------------------------------- Wound Assessment Details Patient Name: [name] Date of Service: 04/30/2021 8:30 AM Medical Record Number: [id number] Patient Account Number: [id number] Date of Birth/Sex: [birth year] (70 y.o. M) Treating RN: Donnamarie Poag Primary Care Cezar Misiaszek: Felipa Eth Other Clinician: Referring Tinnie Kunin: Felipa Eth Treating Tia Hieronymus/Extender: Jeri Cos Weeks in Treatment: 13 Wound Status Wound Number: 17 Primary Lymphedema Etiology: Wound Location: Left, Anterior Lower Leg Wound Open Wounding Event: Blister Status: Date Acquired: 04/24/2021 Comorbid Hypertension, Peripheral Venous Disease, Type II Weeks Of Treatment: 0 History: Diabetes, Neuropathy, Received Chemotherapy Clustered Wound: No Wound Measurements Length: (cm) 5 Width: (cm) 9.5 Depth: (cm) 0.1 Area: (cm) 37.306 Volume: (cm) 3.731 % Reduction in Area: 0% % Reduction in Volume: 0% Epithelialization: None Wound Description Classification: Full Thickness Without Exposed Support Structu Exudate Amount: Large Exudate Type: Serous Exudate Color: amber res Foul Odor After Cleansing: No Slough/Fibrino Yes Wound Bed Granulation Amount: Medium (34-66%) Exposed Structure Granulation Quality: Red Fascia Exposed: No Necrotic Amount: Medium (34-66%) Fat Layer (Subcutaneous Tissue) Exposed: Yes Necrotic Quality: Adherent Slough Tendon Exposed: No Muscle Exposed: No Joint Exposed: No Bone Exposed: No Treatment Notes Wound #17 (Lower Leg) Wound Laterality: Left, Anterior Cleanser Soap and Water Discharge Instruction: Gently cleanse wound with antibacterial soap, rinse and pat dry prior to dressing wounds Peri-Wound Care Topical Primary Dressing Secondary Dressing Secured With Compression Wrap Medichoice 4 layer Compression System, 35-40 mmHG Discharge Instruction: Apply multi-layer wrap as  directed. Compression Stockings Add-Ons RAFAEL, SALWAY ([id number]) Electronic Signature(s) Signed: 04/30/2021 11:51:56 AM By: Donnamarie Poag Entered By: Donnamarie Poag on 04/30/2021 08:59:18

## 2021-04-30 NOTE — Telephone Encounter (Signed)
Patient called back and scheduled a lab draw for 05/04/21 at 10:15.

## 2021-04-30 NOTE — Telephone Encounter (Signed)
Called and spoke with [facility] from the wound center. She stated that when the patient was in the hospital they were giving him IV Lasix 40 MG twice a day and since discharge his legs look 5X better than before he was admitted. Prior to admission, when they would weigh the dressings during wound care they would weigh between 5-10 lbs due to fluid. She stated that the doctor is worried that this will happen again if he is not receiving Lasix as an outpatient. Patient currently has Lasix 20 MG tablets at home from an older prescription. They are also requesting Potassium supplements in addition to an order for Lasix. Dr. Rockey Situ is not here  today. Will ask Dr. Fletcher Anon who is in office this morning to review.   Discharge summary: 70 year old gentleman from home with history of peripheral vascular disease, hypertension, diabetes, chronic diastolic heart failure, CKD stage IIIa, severe lymphedema and venous insufficiency of bilateral leg, morbid obesity with BMI more than 40 presented with worsening leg wounds.  He does have bilateral venous ulcers for more than 2 years and followed at wound care clinic and treated with multiple rounds of antibiotics.  Now reportedly started noticing foul-smelling drainage from the open wounds, seen at wound care clinic and sent to ER.  In the emergency room, hemodynamically stable.   # Infected bilateral lower extremity venous ulcers: Significant bilateral leg swelling, erythema and ulcers.  Probably related to underlying lymphedema. Trial of broad-spectrum IV antibiotics.  Blood cultures negative so far. Lower extremity Dopplers negative for significant acute DVT. Vascular surgery was consulted for the recommendation.  Suggested local wound care. Overlying infection mostly related to chronic lymphedema. Needing compression, wound care. Will change to oral antibiotics with doxycycline and Keflex for 10 days. Wound care to follow-up at home and help with dressing changes.    Type 2 diabetes with renal manifestations: Known A1c 7.  Taking Actos and Amaryl at home.  Resume.  Essential hypertension: Blood pressure stable on lisinopril and hydralazine.    Hyperlipidemia Chronic diastolic heart failure CKD stage IIIa Chronic conditions anemia.  Stable and chronic.   Patient is medically stable.  Will need regular wound care follow-up at home and at wound care clinic.  This will be arranged via home health agencies through the [place].  Stable to discharge once wound care arrangements are made.

## 2021-04-30 NOTE — Telephone Encounter (Addendum)
Spoke with Dr. Fletcher Anon and he advised that the patient take Lasix 40 MG once a day and then get a BMP drawn on Tuesday 05/04/21. I called [name] from the wound care clinic back and informed her of the plan.     Called and spoke with patient and informed him of the plan as well. Patient has some Lasix on hand, will send in a prescription to his pharmacy as well. Patient was not sure what time he could  come in for the lab draw on 05/04/21 as his cousin will have to drive him. He stated that he will call me back to schedule once he speaks with his cousin.

## 2021-05-01 LAB — CULTURE, BLOOD (ROUTINE X 2)
Culture: NO GROWTH
Culture: NO GROWTH

## 2021-05-04 ENCOUNTER — Other Ambulatory Visit: Payer: Self-pay

## 2021-05-04 ENCOUNTER — Other Ambulatory Visit (INDEPENDENT_AMBULATORY_CARE_PROVIDER_SITE_OTHER): Payer: PPO

## 2021-05-04 DIAGNOSIS — I5032 Chronic diastolic (congestive) heart failure: Secondary | ICD-10-CM

## 2021-05-05 LAB — BASIC METABOLIC PANEL
BUN/Creatinine Ratio: 24 (ref 10–24)
BUN: 38 mg/dL — ABNORMAL HIGH (ref 8–27)
CO2: 27 mmol/L (ref 20–29)
Calcium: 9.3 mg/dL (ref 8.6–10.2)
Chloride: 101 mmol/L (ref 96–106)
Creatinine, Ser: 1.59 mg/dL — ABNORMAL HIGH (ref 0.76–1.27)
Glucose: 195 mg/dL — ABNORMAL HIGH (ref 70–99)
Potassium: 4.3 mmol/L (ref 3.5–5.2)
Sodium: 144 mmol/L (ref 134–144)
eGFR: 46 mL/min/{1.73_m2} — ABNORMAL LOW (ref >59)

## 2021-05-07 ENCOUNTER — Other Ambulatory Visit: Payer: Self-pay

## 2021-05-07 ENCOUNTER — Encounter: Payer: No Typology Code available for payment source | Admitting: Internal Medicine

## 2021-05-07 DIAGNOSIS — E11622 Type 2 diabetes mellitus with other skin ulcer: Secondary | ICD-10-CM | POA: Diagnosis not present

## 2021-05-07 NOTE — Progress Notes (Signed)
ABIJAH, ROUSSEL ([id number]) Visit Report for 05/07/2021 HPI Details Patient Name: [name], [name] Date of Service: 05/07/2021 11:15 AM Medical Record Number: [id number] Patient Account Number: [id number] Date of Birth/Sex: [birth year] (70 y.o. M) Treating RN: Donnamarie Poag Primary Care Provider: Felipa Eth Other Clinician: Referring Provider: Felipa Eth Treating Provider/Extender: Tito Dine in Treatment: 14 History of Present Illness HPI Description: 70 year old male who presented to the ER with bilateral lower extremity blisters which had started last week. he has a past medical history of leukemia, diabetes mellitus, hypertension, edema of both lower extremities, his recurrent skin infections, peripheral vascular disease, coronary artery disease, congestive heart failure and peripheral neuropathy. in the ER he was given Rocephin and put on Silvadene cream. he was put on oral doxycycline and was asked to follow-up with the [facility]. His last hemoglobin A1c was 6.6 in December and he checks his blood sugar once a week. He does not have any physicians outside the [place] system. He does not recall any vascular duplex studies done either for arterial or venous disease but was told to wear compression stockings which he does not use 05/30/2016 -- we have not yet received any of his notes from the [facility] hospital system and his arterial and venous duplex studies are scheduled here in [place] around mid February. We are unable to have his insurance accepted by home health agencies and hence he is getting dressings only once a week. 06/06/16 -- -- I received a call from the patient's PCP at the [facility] at [facility] and spoke to Dr. Garvin Fila, phone number [phone] and fax number [phone]. She confirmed that no vascular testing was done over the last 5 years and she would be happy to do them if the patient did want them to be done at the [place] and we could fax him a  request. Readmission: 70 year old male seen by as in February of this year and was referred to vein and vascular for studies and opinion from the vascular surgeons. The patient returns today with a fresh problem having had blisters on his left lower extremity which have been there for about 5 days and he clearly states that he has been wearing his compression stockings as advised though he could not read the moderate compression and has been wearing light compression. Review of his electronic medical records note that he had lower extremity arterial duplex examination done on 06/23/2016 which showed no hemodynamically significant stenosis in the bilateral lower extremity arterial system. He also had a lower extremity venous reflux examination done on 07/07/2016 and it was noted that he had venous incompetence in the right great saphenous vein and bilateral common femoral veins. Patient was seen by Dr. Tamala Julian on the same day and for some reason his notes do not reflect the venous studies or the arterial studies and he recommended patient do a venous duplex ultrasound to look for reflux and return to see him.he would also consider a lymph pump if required. The patient was told that his workup was normal and hence the patient canceled his follow-up appointment. 02/03/17 on evaluation today patient left medial lower extremity blister appears to be doing about the same. It is still continuing to drain and there's still the blistered skin covering the wound bed which is making it difficult for the alternate to do its job. Fortunately there is no evidence of cellulitis. No fevers chills noted. Patient states in general he is not having any significant discomfort. Patient's lower extremity arterial duplex exam  revealed that patient was hemodynamically stable with no evidence of stenosis in regard to the bilateral lower extremities. The lower extremity venous reflux exam revealed the patient had venous  incontinence noted in the right greater saphenous and bilateral common femoral vein. There is no evidence of deep or superficial vein thrombosis in the bilateral lower extremities. Readmission: 11/12/18 Patient presents for evaluation our clinic today concerning issues that he is having with his left lower extremity. He tells me that a couple weeks ago he began developing blisters on the left lower extremity along with increased swelling. He typically wears his compression stockings on a regular basis is previously been evaluated both here as well is with vascular surgery they would recommend lymphedema pumps but unfortunately that somehow fell through and he never heard anything back from that. Nonetheless I think lymphedema pumps would be beneficial for this patient. He does have a history of hypertension and diabetes. Obviously the chronic venous stasis and lymphedema as well. At this point the blisters have been given in more trouble he states sometimes when the blisters openings able to clean it down with alcohol and it will dry out and do well. Unfortunately that has not been the case this time. He is having some discomfort although this mean these with cleaning the areas he doesn't have discomfort just on a regular basis. He has not been able to wear his compression stockings since the blisters arose due to the fact that of course it will drain into the socks causing additional issues and he didn't have any way to wrap this otherwise. He has increased to taking his Lasix every day instead of every other day. He sees his primary care provider later this month as well. No fevers, chills, nausea, or vomiting noted at this time. 11/19/18-Patient returns at 1 week, per intake RN the amount of seepage into the compression wraps was definitely improved, overall all the wounds are measuring smaller but continuing silver alginate to the wounds as primary dressing 11/26/18 on evaluation today patient  appears to be doing quite well in regard to his left lower [place] ulcers. In fact of the areas that were noted initially he only has two regions still open. There is no evidence of active infection at this time. He still is not heard anything from the company regarding lymphedema pumps as of yet. Again as previously seen vascular they have not recommended any surgical intervention. 12/03/2018 on evaluation today patient actually appears to be doing quite well with regard to his lower extremity ulcers. In fact most of the areas [place], [place] ([id number]) appear to be healed the one spot which does not seem to be completely healed I am unsure of whether or not this is really draining that much but nonetheless there does not appear to be any signs of infection or significant drainage at this point. There is no sign of fever, chills, nausea, vomiting, or diarrhea. Overall I am pleased with how things have progressed I think is very close to being able to transition to his home compression stockings. 12/10/2018 upon evaluation today patient appears to be doing quite well with regard to his left lower extremity. He has been tolerating the dressing changes without complication. Fortunately there is no signs of active infection at this time. He appears after thorough evaluation of his leg to only have 1 small area that remains open at this point everything else appears to be almost completely closed. He still have significant swelling of the left lower  extremity. We had discussed discussing this with his primary care provider he is not able to see her in person they were at the [facility] and right now the [place] is not seeing patients on site. According to the patient anyway. Subsequently he did speak with her apparently and his primary care provider feels that he may likely have a DVT. With that being said she has not seen his leg she is just going off of his history. Nonetheless that is a concern that the  patient now has as well and while I do not feel the DVT is likely we can definitely ensure that that is not the case I will go ahead and see about putting that order in today. Nonetheless otherwise I am in a recommend that we continue with the current wound care measures including the compression therapy most likely. We just need to ensure that his leg is indeed free of any DVTs. 12/17/2018 on evaluation today patient actually appears to be completely healed today. He does have 2 very small areas of blistering although this is not anything too significant at this point which is good news. With that being said I am in agreement with the fact that I think he is completely healed at this point. He does want to get back into his compression stocking. The good news is we have gotten approval from insurance for his lymphedema pumps we received a letter since last saw him last week. The other good news is his study did come back and showed no evidence of a DVT. 12/20/2018 on evaluation today patient presents for follow-up concerning his ongoing issues with his left lower extremity. He was actually discharged last Friday and did fairly well until he states blisters opened this morning. He tells me he has been wearing his compression stocking although he has a hard time getting this on. There does not appear to be any signs of active infection at this time. No fevers, chills, nausea, vomiting, or diarrhea. 12/27/2018 on evaluation today patient appears to be doing very well with regard to his swelling of the left lower extremity the 4 layer compression wrap seems to have been beneficial for him. Fortunately there is no signs of active infection at this time. Patient has been tolerating the compression wrap without complication and his foot swelling in particular appears to be greatly improved. He does still have a wound on the lateral portion of his left leg I believe this is more of a blister that has now  reopened. 01/03/2019 on evaluation today patient actually appears to be doing excellent in regard to his left lower extremity. He did receive his compression pumps and is actually use this 7 times since he was last here in the office. On top of the compression wrap he is now roughly 3 cm better at the calf and 2 cm better at the ankle he also states that his foot seem to go an issue better without even having to use a shoe horn. Obviously I think this is all evidence that he is doing excellent in this regard. The other good news is he does not appear to have anything open today as far as wounds are concerned. 01/15/2019 on evaluation today patient appears to be doing more poorly yet again with regard to his left lower extremity. He has developed new wounds again after being discharged just recently. Unfortunately this continues to be the case that he will heal and then have subsequent new wounds. The last  time I was hopeful that he may not end up coming back too quickly especially since he states he has been using his lymphedema pumps along with wearing his compression. Nonetheless he had a blister on the back of his leg that popped up on the left and this has opened up into an ulceration it is quite painful. 01/22/19 on evaluation today patient actually appears to be doing well with regard to his wound on the left lower extremity. He's been tolerating the dressing changes without complication including the compression wrap in the wound appears to be significantly smaller today which is great news. Overall very pleased in this regard. 01/29/2019 on evaluation today patient appears to be doing well with regard to his left posterior lower extremity ulcer. He has been tolerating the dressing changes without complication. This is not completely healed but is getting much closer. We did order a Farrow wrap 4000 for him he has received this and has it with him today although I am not sure we are quite ready to  start him on that as of yet. We are very close. 02/05/2019 on evaluation today patient actually appears to be doing quite well with regard to his left posterior lower extremity ulcer. He still has a very tiny opening remaining but the fortunate thing is he seems to be healing quite nicely. He also did get his Farrow wrap which I am hoping will help with his edema control as well at home. Fortunately there is no evidence of active infection. 02/12/2019 patient and fortunately appears to be doing poorly in regard to his wounds of the left lower extremity. He was very close to healing therefore we attempted to use his Velcro compression wraps continuing with lymphedema pumps at home. Unfortunately that does not seem to have done very well for him. He tells me that he wore them all the time but again I am not sure why if that is the case that he is having such significant edema. He is still on his fluid pills as well. With that being said there is no obvious sign of infection although I do wonder about the possibility of infection at this time as well. 02/19/2019 unfortunately upon evaluation today patient appears to be doing more poorly with regard to his left lower extremity. He is not showing signs of significant improvement and I think the biggest issue here is that he does have an infection that appears to likely be Pseudomonas. That is based on the blue-green drainage that were noted at this time. Unfortunately the antibiotic that has been on is not going to take care of this at all. I think they will get a need to switch him to either Levaquin or Cipro and this was discussed with the patient. 02/26/2019 on evaluation today patient's lower extremity on the left appears to be doing significantly better as compared to last evaluation. Fortunately there is no signs of active infection at this time. He has been tolerating the compression wrap without complication in fact he made it the whole week at this  point. He is showing signs of excellent improvement I am very happy in this regard. With that being said he is having some issues with infection we did review the results of his culture which I noted today. He did have a positive finding for Enterobacter as well as Alcaligenes faecalis. Fortunately the Levaquin that I placed him on will work for both which is great news. There is no signs of systemic infection  at this point. 10/30; left posterior leg wound in the setting of very significant edema and what looks like chronic venous inflammation. He has compression pumps but does not use them. We have been using 3 layer compression. Silver alginate to the wound as the primary dressing 03/18/2019 on evaluation today patient appears to be doing a little better compared to last time I saw him. He really has not been using his compression pumps he tells me that he is having too much discomfort. He has been keeping his wraps on however. He is only been taking his fluid pills every other day because he states they are not really helping and he has an appointment with his primary care provider at the [facility] tomorrow. Subsequently the wound itself on the left lower extremity does seem to be greatly improved compared to previous. [name], [name] ([id number]) 03/25/2019 on evaluation today patient appears to be doing better with regard to his wounds on the bilateral lower extremities. The left is doing excellent the right is also doing better although both still do show some signs of open wounds noted at this point unfortunately. Fortunately there is no signs of active infection at this time. The patient also is not really having any significant pain which is good news. Unfortunately there was some confusion with the referral on vascular disease and as far as getting the patient scheduled there can be contacting him later today to do this fortunately we got this straightened out. 04/01/2019 on evaluation today patient  appears to be doing no fevers, chills, nausea, vomiting, or diarrhea. Excellent at this time with regard to his lower extremities. There does not appear to be any open wound at this point which is good news. Fortunately is also no signs of active infection at this time. Overall feel like the patient has done excellent with the compression the problem is every time we got him to this point and then subsequently go to using his own compression things just go right back to where they were. I am not sure how to address this we can try to get an appointment with vascular for 2 weeks now they have yet to call him. Obviously this has become frustrating for the patient as well. I think the issue has just been an honest error as far as scheduling is concerned but nonetheless still worn out the point where I am unsure of which direction we should take. 04/08/2019 on evaluation today patient actually appears to be doing well with regard to his lower extremities. There are no open wounds at this time and things seem to be managing quite nicely as far as the overall edema control is concerned. With that being said he does have his compression socks today for [place] to go ahead and reinitiate therapy in that manner at this point. He is going to be going for shoes to be measured on Wednesday and then coincidentally he will also be seeing vascular on Thursday. Overall I think this is good news and again I am hopeful that they will be able to do something for him to help prevent ongoing issues with edema control as well. No fevers, chills, nausea, vomiting, or diarrhea. 04/11/2019 on evaluation today patient actually appears to be doing poorly after just being discharged on Monday of this week. He had been experiencing issues with again blisters especially on the left lower extremity. With that being said he was completely healed and appeared to be doing great this past Monday. He then  subsequently has new blisters that  formed before his appointment with vascular this morning. He was also measured for shoes in the interim. With that being said we may have figured out what exactly is going on and why he continues to have issues like what we are seeing at this point. He takes his compression stockings off at nighttime and then he ends up having to sleep in his chair for 5-6 hours a night. He sleeps with his feet down he cannot really get him up in the recliner and therefore he is sleeping and the worst possible his position with his feet on the floor for that majority of the time. Again as I explained to him that is about one third at minimum at least one fourth of his day that he spending with his feet dangling down on the ground and the worst possible position they could be. I think this may be what is causing the issue. Subsequently I am leaning toward thinking that he may need a hospital bed in order to elevate his legs. We likely can have to coordinate this with his primary care provider at the [facility]. Readmission: 01/26/2021 this is a patient who presents for repeat evaluation here in the clinic although it is actually been couple of years since have seen him in fact it was December 2020 when I last saw him. Subsequently he never really healed but did end up being lost to follow-up. He tells me has been having issues ongoing with his lower extremities has bilateral lower extremity lymphedema no real significant or definitive open wounds but in general his lymphedema is way out of control. We were never able to refer him to lymphedema clinic simply due to the fact to be honest we were never able to get him completely healed. I do not see anyone with open wounds. The patient does have evidence of type 2 diabetes mellitus, lymphedema, chronic venous insufficiency, and hypertension. That really has not changed since his last evaluation. 02/09/2021 upon evaluation today patient appears to be doing a little better  in regard to his legs although he still having a tremendous amount of drainage especially on the left leg. Fortunately there does not appear to be any evidence of active infection. Of note when we looked into this further it appears that the patient did not have any absorptive dressing on it was just the 4-layer compression wrap. Nonetheless this is probably big part of the issue here. 10/10; he comes in today with 3 large areas on the upper right lower leg likely remanence of denuded blistering under his compression wraps. He has no other wounds on the right. On the left he has the denuded area on the left medial foot and ankle and on the left dorsal foot. Massive lymphedema in both feet dorsally. Using Zetuvit under compression We have increased home health visitation to twice a week to change the dressings and will change it once 02/22/2021 upon evaluation today patient appears to be doing well currently with regard to his wounds. He has been tolerating the dressing changes without complication. Fortunately there does not appear to be any evidence of active infection which is great news. No fevers, chills, nausea, vomiting, or diarrhea. The biggest issue I see currently is that home health is not putting any medicine on the actual wounds before wrapping. 03/01/2021 upon evaluation today the patient's right leg actually appears to be doing quite well which is great news there does not appear to be  any evidence of active infection at this time. No fevers, chills, nausea, vomiting, or diarrhea. With that being said the patient is having issues on the left foot where he is having significant drainage is also an ammonia smell he does not have any animals at home and this makes me concerned about a bacteria producing urea as a byproduct. Again the possible common organisms will be E. coli, Proteus, and Enterococcus. All 3 of which can be successfully treated with Levaquin. For that reason I think that  this may be a good option for [place] to consider placing him on and I did obtain a culture as well for confirmation sake. 03/08/2021 upon evaluation today patient appears to be doing unfortunately still somewhat poorly in regard to his leg ulcerations. He actually has an area on the right leg where he blistered due to the fact that his wrap slid down and caused an area of pinching on his skin and this has led to a significant issue here. 03/15/2021 upon evaluation today patient unfortunately has not been wrapped appropriately with absorptive dressings nor with the appropriate technique for the third layer of the 4-layer compression wrap. These are issues that we continue to try to address with the home health nurse. Also the absorptive dressing that she had was cut in half and therefore that causes things to leak out it does not actually trap the fluid in regard to the top of the foot overall I think that all these combined are really not seeing things improved significantly here. Fortunately there does not appear to be any signs of significant infection at this time which is good news. He still is having a tremendous amount of drainage. 03/22/2021 upon evaluation today patient appears to be draining tremendously. He still continues to tell me that he is using his pumps 2 times a day and that coupled with that tells me that he is elevating his legs as well. With that being said all things considered I am really just not seeing the improvement we would expect to see with the 4-layer compression wrap and all the above noted. He in fact had an extremely large Zetuvit dressing on both legs and that they were extremely filled to the max with fluid. This is after just being changed just before the weekend and this is Monday. Nonetheless I am concerned about the fact that there is something going on fairly significant that we cannot get any of this under [place], [name] ([id number]) control and that he is draining  this significantly. He supposed be having an echocardiogram it sounds like scheduling has been an issue for him as far as getting in sooner. Its something to do with needing his cousin to drive him because of where it sat and he cannot drive himself to this appointment either way I really think he needs to try to see what he can do about making this happen a little sooner. He tells me he will call today. 03/29/2021 on evaluation today patient appears to be doing about the same in regard to his legs. He did get his cardiology appointment moved up to 6 December which is at least good that is better than what it was before mid December. Overall very pleased in that regard. 04/05/2021 upon evaluation today patient unfortunately is still doing fairly poorly. There does not appear to be any signs of active infection at this time. No fevers, chills, nausea, vomiting, or diarrhea. Unfortunately I think until his edema is under control and  overall fluid overload there is really not to be much chance that I can do much to get him better. This is quite unfortunate and frustrating both for myself and the patient to be perfectly honest. Nonetheless I think that he really needs to have a conversation both with his primary care provider as well as cardiologist he sees the PCP on Monday and cardiology on Wednesday of next week. 04/13/2021 upon evaluation today patient appears to be doing poorly in regard to his bilateral lower extremities his left is still worse than the right. With that being said he has a tremendous amount of drainage he did see his primary care provider yesterday there really was not much there to be done from their perspective. He sees cardiology tomorrow. Nonetheless my biggest concern here is simply that if we do not get the edema under control he is going to continue to have drainage and honestly I think at some point he is going to become infected severely that is my main concern. 04/19/2021  upon evaluation today patient appears to be doing poorly still in regard to his legs. Unfortunately there does not appear to be any signs of infection at this point. He does have a tremendous amount of drainage however. We have not seen the results back from the cardiologist and the echocardiogram that was done. It appears that the patient checked out okay as far as that is concerned with regard to ejection fraction though we still have some issues here to be honest with his diastolic function. I am unsure if this is accounting for everything that we are seeing or not. Either way he has a tremendous amount of drainage from his legs that we are just not able to control in the outpatient setting at this point. I have reached out to Dr. Rockey Situ his cardiologist to see once he reviews the sheet if there is anything that he feels like can be done from an outpatient perspective if not then I think the way to go is probably can to be through inpatient admission and diuresis. Otherwise I am not sure how working to get this under control we tried antibiotics, compression wrapping, and I have told the patient to be elevating his legs I am not sure how much he does of this but either way I think that this is still an ongoing issue nonetheless. 04/26/2021 upon evaluation today patient appears to be doing poorly in regard to his legs. He is having a tremendous amount of fluid at this point which is quite unfortunate. Its to the point that he may have had at least 5 to 10 pounds of fluid in his dressings this morning when they were removed these were changed this Friday. Subsequently I think he needs to go to the ER for further evaluation and treatment I think is probably can need diuresis possibly even IV antibiotics been on what the blood work looks like but in general I feel like he needs something to get this under control from an outpatient perspective absent of everywhere I can think of and I cannot get this  under control with our traditional measures. I think this is going require more so that we can get him better 12/30; this is a patient with severe bilateral lymphedema. He was hospitalized from 04/26/2021 through 04/29/2021 treated for cellulitis in the setting of lower extremity ulcers and lymphedema. After he left the hospital he is apparently seen for nurse visit our staff contacted cardiology and he has been  started on Lasix 40 mg. Apparently his legs have less edema. Lab work from 05/04/2021 showed a BUN of 38 and creatinine of 1.59 these are elevated versus previous where his creatinine seems to have been 1.30 on 12/19 his potassium is 4.3. I believe the lab work is being followed by cardiology We have him in a 4-layer wrap. Xeroform on the leg wounds and sit to fit on the Berry damage skin on the left dorsal foot versus right dorsal foot. He has compression pumps but does not use them. We have apparently not yet ordered him compression stockings Electronic Signature(s) Signed: 05/07/2021 4:08:28 PM By: Linton Ham MD Entered By: Linton Ham on 05/07/2021 12:10:50 [name] ([id number]) -------------------------------------------------------------------------------- Physical Exam Details Patient Name: [name] Date of Service: 05/07/2021 11:15 AM Medical Record Number: [id number] Patient Account Number: [id number] Date of Birth/Sex: [birth year] (70 y.o. M) Treating RN: Donnamarie Poag Primary Care Provider: Felipa Eth Other Clinician: Referring Provider: Felipa Eth Treating Provider/Extender: Tito Dine in Treatment: 14 Notes Wound exam; massive lymphedema in the dorsal left foot. Berry damage skin from the midfoot towards his toes thick fissured verricused skin. There is lesser changes on the right the edema control in his legs looks satisfactory,I see no evidence of infection Electronic Signature(s) Signed: 05/07/2021 4:08:28 PM By: Linton Ham MD Entered By: Linton Ham on 05/07/2021 12:11:54 [name] ([id number]) -------------------------------------------------------------------------------- Physician Orders Details Patient Name: [name] Date of Service: 05/07/2021 11:15 AM Medical Record Number: [id number] Patient Account Number: [id number] Date of Birth/Sex: [birth year] (70 y.o. M) Treating RN: Donnamarie Poag Primary Care Provider: Felipa Eth Other Clinician: Referring Provider: Felipa Eth Treating Provider/Extender: Tito Dine in Treatment: 19 Verbal / Phone Orders: No Diagnosis Coding Follow-up Appointments o Return Appointment in 1 week. o Nurse Visit as needed [place] for wound care. May utilize formulary equivalent dressing for wound treatment orders unless otherwise specified. Home Health Nurse may visit PRN to address patientos wound care needs. - frequency 3 times per week - patient will be seen once at wound center - home health to see patient 2 times per week Bathing/ Shower/ Hygiene o May shower with wound dressing protected with water repellent cover or cast protector. o No tub bath. Edema Control - Lymphedema / Segmental Compressive Device / Other Bilateral Lower Extremities o Optional: One layer of unna paste to top of compression wrap (to act as an anchor). - Unna paste on feet and calf to secure wrap in place o 4 Layer Compression System Lymphedema. - bilateral legs bi lat, apply zetuvits/ or formulary for extra absorbant dressing around ankles and foot area, 3 times per week COTTON LAYER SPIRAL, LIGHT TAN SPIRAL. WHITE WITH YELLOW LINE FIGURE 8 , COBAN SPIRAL o Elevate, Exercise Daily and Avoid Standing for Long Periods of Time. o Elevate legs to the level of the heart and pump ankles as often as possible o Elevate leg(s) parallel to the floor when sitting. o Compression Pump:  Use compression pump on left lower extremity for 60 minutes, twice daily. - Start today o Compression Pump: Use compression pump on right lower extremity for 60 minutes, twice daily. - Start today o DO YOUR BEST to sleep in the bed at night. DO NOT sleep in your recliner. Long hours of sitting in a recliner leads to swelling of the legs and/or potential wounds on your backside. o Other: - Contact prescriber regarding use of diuretics  to reduce fluid overload. Off-Loading Bilateral Lower Extremities o Open toe surgical shoe Wound Treatment Wound #17 - Lower Leg Wound Laterality: Left, Anterior Cleanser: Soap and Water 3 x Per Week/30 Days Discharge Instructions: Gently cleanse wound with antibacterial soap, rinse and pat dry prior to dressing wounds Topical: Triamcinolone Acetonide Cream, 0.1%, 15 (g) tube 3 x Per Week/30 Days Discharge Instructions: to bilateral lower legs-Apply as directed by provider. Topical: Moisturizer lotion 3 x Per Week/30 Days Secondary Dressing: Zetuvit Plus Silicone Non-bordered 5x5 (in/in) 3 x Per Week/30 Days Discharge Instructions: cover feet and any draining areas Compression Wrap: Medichoice 4 layer Compression System, 35-40 mmHG 3 x Per Week/30 Days Discharge Instructions: Apply multi-layer wrap as directed. Electronic Signature(s) Signed: 05/07/2021 12:43:37 PM By: Donnamarie Poag Signed: 05/07/2021 4:08:28 PM By: Linton Ham MD Entered By: Donnamarie Poag on 05/07/2021 11:59:52 [name], [name] ([id number]) [name], [name] ([id number]) -------------------------------------------------------------------------------- Problem List Details Patient Name: JAQUARIUS, SEDER Date of Service: 05/07/2021 11:15 AM Medical Record Number: [id number] Patient Account Number: [id number] Date of Birth/Sex: 1950-07-08 (70 y.o. M) Treating RN: Donnamarie Poag Primary Care Provider: Felipa Eth Other Clinician: Referring Provider: Felipa Eth Treating Provider/Extender:  Tito Dine in Treatment: 14 Active Problems ICD-10 Encounter Code Description Active Date MDM Diagnosis E11.622 Type 2 diabetes mellitus with other skin ulcer 01/26/2021 No Yes I89.0 Lymphedema, not elsewhere classified 01/26/2021 No Yes I87.2 Venous insufficiency (chronic) (peripheral) 01/26/2021 No Yes L97.822 Non-pressure chronic ulcer of other part of left lower leg with fat layer 01/26/2021 No Yes exposed L97.512 Non-pressure chronic ulcer of other part of right foot with fat layer 01/26/2021 No Yes exposed [place] (primary) hypertension 01/26/2021 No Yes L97.811 Non-pressure chronic ulcer of other part of right lower leg limited to 02/15/2021 No Yes breakdown of skin Inactive Problems Resolved Problems Electronic Signature(s) Signed: 05/07/2021 4:08:28 PM By: Linton Ham MD Entered By: Linton Ham on 05/07/2021 12:08:38 [name] ([id number]) -------------------------------------------------------------------------------- Progress Note Details Patient Name: [name] Date of Service: 05/07/2021 11:15 AM Medical Record Number: [id number] Patient Account Number: [id number] Date of Birth/Sex: 10/02/1950 (70 y.o. M) Treating RN: Donnamarie Poag Primary Care Provider: Felipa Eth Other Clinician: Referring Provider: Felipa Eth Treating Provider/Extender: Tito Dine in Treatment: 14 Subjective History of Present Illness (HPI) 70 year old male who presented to the ER with bilateral lower extremity blisters which had started last week. he has a past medical history of leukemia, diabetes mellitus, hypertension, edema of both lower extremities, his recurrent skin infections, peripheral vascular disease, coronary artery disease, congestive heart failure and peripheral neuropathy. in the ER he was given Rocephin and put on Silvadene cream. he was put on oral doxycycline and was asked to follow-up with the [facility]. His last hemoglobin  A1c was 6.6 in December and he checks his blood sugar once a week. He does not have any physicians outside the [place] system. He does not recall any vascular duplex studies done either for arterial or venous disease but was told to wear compression stockings which he does not use 05/30/2016 -- we have not yet received any of his notes from the [facility] hospital system and his arterial and venous duplex studies are scheduled here in [place] around mid February. We are unable to have his insurance accepted by home health agencies and hence he is getting dressings only once a week. 06/06/16 -- -- I received a call from the patient's PCP at the [facility] at [facility] and spoke to Dr. Garvin Fila, phone number [phone] and fax number  [phone]. She confirmed that no vascular testing was done over the last 5 years and she would be happy to do them if the patient did want them to be done at the [place] and we could fax him a request. Readmission: 70 year old male seen by as in February of this year and was referred to vein and vascular for studies and opinion from the vascular surgeons. The patient returns today with a fresh problem having had blisters on his left lower extremity which have been there for about 5 days and he clearly states that he has been wearing his compression stockings as advised though he could not read the moderate compression and has been wearing light compression. Review of his electronic medical records note that he had lower extremity arterial duplex examination done on 06/23/2016 which showed no hemodynamically significant stenosis in the bilateral lower extremity arterial system. He also had a lower extremity venous reflux examination done on 07/07/2016 and it was noted that he had venous incompetence in the right great saphenous vein and bilateral common femoral veins. Patient was seen by Dr. Tamala Julian on the same day and for some reason his notes do not reflect the venous studies or the  arterial studies and he recommended patient do a venous duplex ultrasound to look for reflux and return to see him.he would also consider a lymph pump if required. The patient was told that his workup was normal and hence the patient canceled his follow-up appointment. 02/03/17 on evaluation today patient left medial lower extremity blister appears to be doing about the same. It is still continuing to drain and there's still the blistered skin covering the wound bed which is making it difficult for the alternate to do its job. Fortunately there is no evidence of cellulitis. No fevers chills noted. Patient states in general he is not having any significant discomfort. Patient's lower extremity arterial duplex exam revealed that patient was hemodynamically stable with no evidence of stenosis in regard to the bilateral lower extremities. The lower extremity venous reflux exam revealed the patient had venous incontinence noted in the right greater saphenous and bilateral common femoral vein. There is no evidence of deep or superficial vein thrombosis in the bilateral lower extremities. Readmission: 11/12/18 Patient presents for evaluation our clinic today concerning issues that he is having with his left lower extremity. He tells me that a couple weeks ago he began developing blisters on the left lower extremity along with increased swelling. He typically wears his compression stockings on a regular basis is previously been evaluated both here as well is with vascular surgery they would recommend lymphedema pumps but unfortunately that somehow fell through and he never heard anything back from that. Nonetheless I think lymphedema pumps would be beneficial for this patient. He does have a history of hypertension and diabetes. Obviously the chronic venous stasis and lymphedema as well. At this point the blisters have been given in more trouble he states sometimes when the blisters openings able to clean it  down with alcohol and it will dry out and do well. Unfortunately that has not been the case this time. He is having some discomfort although this mean these with cleaning the areas he doesn't have discomfort just on a regular basis. He has not been able to wear his compression stockings since the blisters arose due to the fact that of course it will drain into the socks causing additional issues and he didn't have any way to wrap this otherwise. He has increased  to taking his Lasix every day instead of every other day. He sees his primary care provider later this month as well. No fevers, chills, nausea, or vomiting noted at this time. 11/19/18-Patient returns at 1 week, per intake RN the amount of seepage into the compression wraps was definitely improved, overall all the wounds are measuring smaller but continuing silver alginate to the wounds as primary dressing 11/26/18 on evaluation today patient appears to be doing quite well in regard to his left lower [place] ulcers. In fact of the areas that were noted initially he only has two regions still open. There is no evidence of active infection at this time. He still is not heard anything from the company regarding lymphedema pumps as of yet. Again as previously seen vascular they have not recommended any surgical intervention. 12/03/2018 on evaluation today patient actually appears to be doing quite well with regard to his lower extremity ulcers. In fact most of the areas appear to be healed the one spot which does not seem to be completely healed I am unsure of whether or not this is really draining that much but nonetheless there does not appear to be any signs of infection or significant drainage at this point. There is no sign of fever, chills, nausea, vomiting, or diarrhea. Overall I am pleased with how things have progressed I think is very close to being able to transition to his home compression stockings. [name], [name] ([id number]) 12/10/2018  upon evaluation today patient appears to be doing quite well with regard to his left lower extremity. He has been tolerating the dressing changes without complication. Fortunately there is no signs of active infection at this time. He appears after thorough evaluation of his leg to only have 1 small area that remains open at this point everything else appears to be almost completely closed. He still have significant swelling of the left lower extremity. We had discussed discussing this with his primary care provider he is not able to see her in person they were at the [facility] and right now the [place] is not seeing patients on site. According to the patient anyway. Subsequently he did speak with her apparently and his primary care provider feels that he may likely have a DVT. With that being said she has not seen his leg she is just going off of his history. Nonetheless that is a concern that the patient now has as well and while I do not feel the DVT is likely we can definitely ensure that that is not the case I will go ahead and see about putting that order in today. Nonetheless otherwise I am in a recommend that we continue with the current wound care measures including the compression therapy most likely. We just need to ensure that his leg is indeed free of any DVTs. 12/17/2018 on evaluation today patient actually appears to be completely healed today. He does have 2 very small areas of blistering although this is not anything too significant at this point which is good news. With that being said I am in agreement with the fact that I think he is completely healed at this point. He does want to get back into his compression stocking. The good news is we have gotten approval from insurance for his lymphedema pumps we received a letter since last saw him last week. The other good news is his study did come back and showed no evidence of a DVT. 12/20/2018 on evaluation today patient presents for  follow-up concerning his ongoing issues with his left lower extremity. He was actually discharged last Friday and did fairly well until he states blisters opened this morning. He tells me he has been wearing his compression stocking although he has a hard time getting this on. There does not appear to be any signs of active infection at this time. No fevers, chills, nausea, vomiting, or diarrhea. 12/27/2018 on evaluation today patient appears to be doing very well with regard to his swelling of the left lower extremity the 4 layer compression wrap seems to have been beneficial for him. Fortunately there is no signs of active infection at this time. Patient has been tolerating the compression wrap without complication and his foot swelling in particular appears to be greatly improved. He does still have a wound on the lateral portion of his left leg I believe this is more of a blister that has now reopened. 01/03/2019 on evaluation today patient actually appears to be doing excellent in regard to his left lower extremity. He did receive his compression pumps and is actually use this 7 times since he was last here in the office. On top of the compression wrap he is now roughly 3 cm better at the calf and 2 cm better at the ankle he also states that his foot seem to go an issue better without even having to use a shoe horn. Obviously I think this is all evidence that he is doing excellent in this regard. The other good news is he does not appear to have anything open today as far as wounds are concerned. 01/15/2019 on evaluation today patient appears to be doing more poorly yet again with regard to his left lower extremity. He has developed new wounds again after being discharged just recently. Unfortunately this continues to be the case that he will heal and then have subsequent new wounds. The last time I was hopeful that he may not end up coming back too quickly especially since he states he has been  using his lymphedema pumps along with wearing his compression. Nonetheless he had a blister on the back of his leg that popped up on the left and this has opened up into an ulceration it is quite painful. 01/22/19 on evaluation today patient actually appears to be doing well with regard to his wound on the left lower extremity. He's been tolerating the dressing changes without complication including the compression wrap in the wound appears to be significantly smaller today which is great news. Overall very pleased in this regard. 01/29/2019 on evaluation today patient appears to be doing well with regard to his left posterior lower extremity ulcer. He has been tolerating the dressing changes without complication. This is not completely healed but is getting much closer. We did order a Farrow wrap 4000 for him he has received this and has it with him today although I am not sure we are quite ready to start him on that as of yet. We are very close. 02/05/2019 on evaluation today patient actually appears to be doing quite well with regard to his left posterior lower extremity ulcer. He still has a very tiny opening remaining but the fortunate thing is he seems to be healing quite nicely. He also did get his Farrow wrap which I am hoping will help with his edema control as well at home. Fortunately there is no evidence of active infection. 02/12/2019 patient and fortunately appears to be doing poorly in regard to his wounds of the  left lower extremity. He was very close to healing therefore we attempted to use his Velcro compression wraps continuing with lymphedema pumps at home. Unfortunately that does not seem to have done very well for him. He tells me that he wore them all the time but again I am not sure why if that is the case that he is having such significant edema. He is still on his fluid pills as well. With that being said there is no obvious sign of infection although I do wonder about  the possibility of infection at this time as well. 02/19/2019 unfortunately upon evaluation today patient appears to be doing more poorly with regard to his left lower extremity. He is not showing signs of significant improvement and I think the biggest issue here is that he does have an infection that appears to likely be Pseudomonas. That is based on the blue-green drainage that were noted at this time. Unfortunately the antibiotic that has been on is not going to take care of this at all. I think they will get a need to switch him to either Levaquin or Cipro and this was discussed with the patient. 02/26/2019 on evaluation today patient's lower extremity on the left appears to be doing significantly better as compared to last evaluation. Fortunately there is no signs of active infection at this time. He has been tolerating the compression wrap without complication in fact he made it the whole week at this point. He is showing signs of excellent improvement I am very happy in this regard. With that being said he is having some issues with infection we did review the results of his culture which I noted today. He did have a positive finding for Enterobacter as well as Alcaligenes faecalis. Fortunately the Levaquin that I placed him on will work for both which is great news. There is no signs of systemic infection at this point. 10/30; left posterior leg wound in the setting of very significant edema and what looks like chronic venous inflammation. He has compression pumps but does not use them. We have been using 3 layer compression. Silver alginate to the wound as the primary dressing 03/18/2019 on evaluation today patient appears to be doing a little better compared to last time I saw him. He really has not been using his compression pumps he tells me that he is having too much discomfort. He has been keeping his wraps on however. He is only been taking his fluid pills every other day because he  states they are not really helping and he has an appointment with his primary care provider at the [facility] tomorrow. Subsequently the wound itself on the left lower extremity does seem to be greatly improved compared to previous. 03/25/2019 on evaluation today patient appears to be doing better with regard to his wounds on the bilateral lower extremities. The left is doing excellent the right is also doing better although both still do show some signs of open wounds noted at this point unfortunately. Fortunately there is no signs of active infection at this time. The patient also is not really having any significant pain which is good news. Unfortunately there was some confusion with the referral on vascular disease and as far as getting the patient scheduled there can be contacting him later today to do this [name], [name] ([id number]) fortunately we got this straightened out. 04/01/2019 on evaluation today patient appears to be doing no fevers, chills, nausea, vomiting, or diarrhea. Excellent at this time with regard  to his lower extremities. There does not appear to be any open wound at this point which is good news. Fortunately is also no signs of active infection at this time. Overall feel like the patient has done excellent with the compression the problem is every time we got him to this point and then subsequently go to using his own compression things just go right back to where they were. I am not sure how to address this we can try to get an appointment with vascular for 2 weeks now they have yet to call him. Obviously this has become frustrating for the patient as well. I think the issue has just been an honest error as far as scheduling is concerned but nonetheless still worn out the point where I am unsure of which direction we should take. 04/08/2019 on evaluation today patient actually appears to be doing well with regard to his lower extremities. There are no open wounds at this time and  things seem to be managing quite nicely as far as the overall edema control is concerned. With that being said he does have his compression socks today for [place] to go ahead and reinitiate therapy in that manner at this point. He is going to be going for shoes to be measured on Wednesday and then coincidentally he will also be seeing vascular on Thursday. Overall I think this is good news and again I am hopeful that they will be able to do something for him to help prevent ongoing issues with edema control as well. No fevers, chills, nausea, vomiting, or diarrhea. 04/11/2019 on evaluation today patient actually appears to be doing poorly after just being discharged on Monday of this week. He had been experiencing issues with again blisters especially on the left lower extremity. With that being said he was completely healed and appeared to be doing great this past Monday. He then subsequently has new blisters that formed before his appointment with vascular this morning. He was also measured for shoes in the interim. With that being said we may have figured out what exactly is going on and why he continues to have issues like what we are seeing at this point. He takes his compression stockings off at nighttime and then he ends up having to sleep in his chair for 5-6 hours a night. He sleeps with his feet down he cannot really get him up in the recliner and therefore he is sleeping and the worst possible his position with his feet on the floor for that majority of the time. Again as I explained to him that is about one third at minimum at least one fourth of his day that he spending with his feet dangling down on the ground and the worst possible position they could be. I think this may be what is causing the issue. Subsequently I am leaning toward thinking that he may need a hospital bed in order to elevate his legs. We likely can have to coordinate this with his primary care provider at the [facility]. Readmission: 01/26/2021 this is a patient who presents for repeat evaluation here in the clinic although it is actually been couple of years since have seen him in fact it was December 2020 when I last saw him. Subsequently he never really healed but did end up being lost to follow-up. He tells me has been having issues ongoing with his lower extremities has bilateral lower extremity lymphedema no real significant or definitive open wounds but in  general his lymphedema is way out of control. We were never able to refer him to lymphedema clinic simply due to the fact to be honest we were never able to get him completely healed. I do not see anyone with open wounds. The patient does have evidence of type 2 diabetes mellitus, lymphedema, chronic venous insufficiency, and hypertension. That really has not changed since his last evaluation. 02/09/2021 upon evaluation today patient appears to be doing a little better in regard to his legs although he still having a tremendous amount of drainage especially on the left leg. Fortunately there does not appear to be any evidence of active infection. Of note when we looked into this further it appears that the patient did not have any absorptive dressing on it was just the 4-layer compression wrap. Nonetheless this is probably big part of the issue here. 10/10; he comes in today with 3 large areas on the upper right lower leg likely remanence of denuded blistering under his compression wraps. He has no other wounds on the right. On the left he has the denuded area on the left medial foot and ankle and on the left dorsal foot. Massive lymphedema in both feet dorsally. Using Zetuvit under compression We have increased home health visitation to twice a week to change the dressings and will change it once 02/22/2021 upon evaluation today patient appears to be doing well currently with regard to his wounds. He has been tolerating the dressing changes  without complication. Fortunately there does not appear to be any evidence of active infection which is great news. No fevers, chills, nausea, vomiting, or diarrhea. The biggest issue I see currently is that home health is not putting any medicine on the actual wounds before wrapping. 03/01/2021 upon evaluation today the patient's right leg actually appears to be doing quite well which is great news there does not appear to be any evidence of active infection at this time. No fevers, chills, nausea, vomiting, or diarrhea. With that being said the patient is having issues on the left foot where he is having significant drainage is also an ammonia smell he does not have any animals at home and this makes me concerned about a bacteria producing urea as a byproduct. Again the possible common organisms will be E. coli, Proteus, and Enterococcus. All 3 of which can be successfully treated with Levaquin. For that reason I think that this may be a good option for [place] to consider placing him on and I did obtain a culture as well for confirmation sake. 03/08/2021 upon evaluation today patient appears to be doing unfortunately still somewhat poorly in regard to his leg ulcerations. He actually has an area on the right leg where he blistered due to the fact that his wrap slid down and caused an area of pinching on his skin and this has led to a significant issue here. 03/15/2021 upon evaluation today patient unfortunately has not been wrapped appropriately with absorptive dressings nor with the appropriate technique for the third layer of the 4-layer compression wrap. These are issues that we continue to try to address with the home health nurse. Also the absorptive dressing that she had was cut in half and therefore that causes things to leak out it does not actually trap the fluid in regard to the top of the foot overall I think that all these combined are really not seeing things improved significantly here.  Fortunately there does not appear to be any signs of significant infection  at this time which is good news. He still is having a tremendous amount of drainage. 03/22/2021 upon evaluation today patient appears to be draining tremendously. He still continues to tell me that he is using his pumps 2 times a day and that coupled with that tells me that he is elevating his legs as well. With that being said all things considered I am really just not seeing the improvement we would expect to see with the 4-layer compression wrap and all the above noted. He in fact had an extremely large Zetuvit dressing on both legs and that they were extremely filled to the max with fluid. This is after just being changed just before the weekend and this is Monday. Nonetheless I am concerned about the fact that there is something going on fairly significant that we cannot get any of this under control and that he is draining this significantly. He supposed be having an echocardiogram it sounds like scheduling has been an issue for him as far as getting in sooner. Its something to do with needing his cousin to drive him because of where it sat and he cannot drive himself to this appointment either way I really think he needs to try to see what he can do about making this happen a little sooner. He tells me he will call today. [name], [name] ([id number]) 03/29/2021 on evaluation today patient appears to be doing about the same in regard to his legs. He did get his cardiology appointment moved up to 6 December which is at least good that is better than what it was before mid December. Overall very pleased in that regard. 04/05/2021 upon evaluation today patient unfortunately is still doing fairly poorly. There does not appear to be any signs of active infection at this time. No fevers, chills, nausea, vomiting, or diarrhea. Unfortunately I think until his edema is under control and overall fluid overload there is really not to  be much chance that I can do much to get him better. This is quite unfortunate and frustrating both for myself and the patient to be perfectly honest. Nonetheless I think that he really needs to have a conversation both with his primary care provider as well as cardiologist he sees the PCP on Monday and cardiology on Wednesday of next week. 04/13/2021 upon evaluation today patient appears to be doing poorly in regard to his bilateral lower extremities his left is still worse than the right. With that being said he has a tremendous amount of drainage he did see his primary care provider yesterday there really was not much there to be done from their perspective. He sees cardiology tomorrow. Nonetheless my biggest concern here is simply that if we do not get the edema under control he is going to continue to have drainage and honestly I think at some point he is going to become infected severely that is my main concern. 04/19/2021 upon evaluation today patient appears to be doing poorly still in regard to his legs. Unfortunately there does not appear to be any signs of infection at this point. He does have a tremendous amount of drainage however. We have not seen the results back from the cardiologist and the echocardiogram that was done. It appears that the patient checked out okay as far as that is concerned with regard to ejection fraction though we still have some issues here to be honest with his diastolic function. I am unsure if this is accounting for everything that we are seeing  or not. Either way he has a tremendous amount of drainage from his legs that we are just not able to control in the outpatient setting at this point. I have reached out to Dr. Rockey Situ his cardiologist to see once he reviews the sheet if there is anything that he feels like can be done from an outpatient perspective if not then I think the way to go is probably can to be through inpatient admission and diuresis. Otherwise I  am not sure how working to get this under control we tried antibiotics, compression wrapping, and I have told the patient to be elevating his legs I am not sure how much he does of this but either way I think that this is still an ongoing issue nonetheless. 04/26/2021 upon evaluation today patient appears to be doing poorly in regard to his legs. He is having a tremendous amount of fluid at this point which is quite unfortunate. Its to the point that he may have had at least 5 to 10 pounds of fluid in his dressings this morning when they were removed these were changed this Friday. Subsequently I think he needs to go to the ER for further evaluation and treatment I think is probably can need diuresis possibly even IV antibiotics been on what the blood work looks like but in general I feel like he needs something to get this under control from an outpatient perspective absent of everywhere I can think of and I cannot get this under control with our traditional measures. I think this is going require more so that we can get him better 12/30; this is a patient with severe bilateral lymphedema. He was hospitalized from 04/26/2021 through 04/29/2021 treated for cellulitis in the setting of lower extremity ulcers and lymphedema. After he left the hospital he is apparently seen for nurse visit our staff contacted cardiology and he has been started on Lasix 40 mg. Apparently his legs have less edema. Lab work from 05/04/2021 showed a BUN of 38 and creatinine of 1.59 these are elevated versus previous where his creatinine seems to have been 1.30 on 12/19 his potassium is 4.3. I believe the lab work is being followed by cardiology We have him in a 4-layer wrap. Xeroform on the leg wounds and sit to fit on the Berry damage skin on the left dorsal foot versus right dorsal foot. He has compression pumps but does not use them. We have apparently not yet ordered him compression  stockings Objective Constitutional Vitals Time Taken: 11:30 AM, Height: 73 in, Weight: 312 lbs, BMI: 41.2, Temperature: 97.7 F, Pulse: 71 bpm, Respiratory Rate: 16 breaths/min, Blood Pressure: 132/85 mmHg. Integumentary (Hair, Skin) Wound #15 status is Converted. Original cause of wound was Blister. The date acquired was: 03/08/2021. The wound has been in treatment 8 weeks. The wound is located on the Left,Medial Lower Leg. The wound measures 0cm length x 0cm width x 0cm depth; 0cm^2 area and 0cm^3 volume. There is a large amount of serous drainage noted. Wound #17 status is Open. Original cause of wound was Blister. The date acquired was: 04/24/2021. The wound has been in treatment 1 weeks. The wound is located on the Left,Anterior Lower Leg. The wound measures 1cm length x 1cm width x 0.1cm depth; 0.785cm^2 area and 0.079cm^3 volume. There is Fat Layer (Subcutaneous Tissue) exposed. There is no tunneling or undermining noted. There is a large amount of serous drainage noted. There is small (1-33%) red granulation within the wound bed. There is  a large (67-100%) amount of necrotic tissue within the wound bed including Eschar and Adherent Slough. Assessment Active Problems ICD-10 [name], [name] ([id number]) Type 2 diabetes mellitus with other skin ulcer Lymphedema, not elsewhere classified Venous insufficiency (chronic) (peripheral) Non-pressure chronic ulcer of other part of left lower leg with fat layer exposed Non-pressure chronic ulcer of other part of right foot with fat layer exposed Essential (primary) hypertension Non-pressure chronic ulcer of other part of right lower leg limited to breakdown of skin Procedures Wound #17 Pre-procedure diagnosis of Wound #17 is a Lymphedema located on the Left,Anterior Lower Leg . There was a Four Layer Compression Therapy Procedure by Donnamarie Poag, RN. Post procedure Diagnosis Wound #17: Same as Pre-Procedure There was a Four Layer Compression  Therapy Procedure by Donnamarie Poag, RN. Post procedure Diagnosis Wound #: Same as Pre-Procedure Plan Follow-up Appointments: Return Appointment in 1 week. Nurse Visit as needed Home Health: [place]: - [place] for wound care. May utilize formulary equivalent dressing for wound treatment orders unless otherwise specified. Home Health Nurse may visit PRN to address patient s wound care needs. - frequency 3 times per week - patient will be seen once at wound center - home health to see patient 2 times per week Bathing/ Shower/ Hygiene: May shower with wound dressing protected with water repellent cover or cast protector. No tub bath. Edema Control - Lymphedema / Segmental Compressive Device / Other: Optional: One layer of unna paste to top of compression wrap (to act as an anchor). - Unna paste on feet and calf to secure wrap in place 4 Layer Compression System Lymphedema. - bilateral legs bi lat, apply zetuvits/ or formulary for extra absorbant dressing around ankles and foot area, 3 times per week COTTON LAYER SPIRAL, LIGHT TAN SPIRAL. WHITE WITH YELLOW LINE FIGURE 8 , COBAN SPIRAL Elevate, Exercise Daily and Avoid Standing for Long Periods of Time. Elevate legs to the level of the heart and pump ankles as often as possible Elevate leg(s) parallel to the floor when sitting. Compression Pump: Use compression pump on left lower extremity for 60 minutes, twice daily. - Start today Compression Pump: Use compression pump on right lower extremity for 60 minutes, twice daily. - Start today DO YOUR BEST to sleep in the bed at night. DO NOT sleep in your recliner. Long hours of sitting in a recliner leads to swelling of the legs and/or potential wounds on your backside. Other: - Contact prescriber regarding use of diuretics to reduce fluid overload. Off-Loading: Open toe surgical shoe WOUND #17: - Lower Leg Wound Laterality: Left, Anterior Cleanser: Soap and Water 3 x  Per Week/30 Days Discharge Instructions: Gently cleanse wound with antibacterial soap, rinse and pat dry prior to dressing wounds Topical: Triamcinolone Acetonide Cream, 0.1%, 15 (g) tube 3 x Per Week/30 Days Discharge Instructions: to bilateral lower legs-Apply as directed by provider. Topical: Moisturizer lotion 3 x Per Week/30 Days Secondary Dressing: Zetuvit Plus Silicone Non-bordered 5x5 (in/in) 3 x Per Week/30 Days Discharge Instructions: cover feet and any draining areas Compression Wrap: Medichoice 4 layer Compression System, 35-40 mmHG 3 x Per Week/30 Days Discharge Instructions: Apply multi-layer wrap as directed. 1. We continued with visit to put to the damage skin on his bilateral feet. There is nothing open on his legs put him back in 4-layer compression. 2. He told me he is using his compression pumps about once a day I have asked him to do it twice 3. He is going to  need compression stockings probably external compression stockings. I cannot imagine him getting over the toe stockings on [place], [name] ([id number]) and he lives alone 4. He seems to have done well with the starting of Lasix although his BUN and creatinine are elevated a bit presumably cardiology is following this. His potassium is slightly elevated versus previous but it is within the normal range of 4.3 Electronic Signature(s) Signed: 05/07/2021 4:08:28 PM By: Linton Ham MD Entered By: Linton Ham on 05/07/2021 12:13:06 [name] ([id number]) -------------------------------------------------------------------------------- [place] Details Patient Name: [name] Date of Service: 05/07/2021 Medical Record Number: [id number] Patient Account Number: [id number] Date of Birth/Sex: 08-26-1950 (69 y.o. M) Treating RN: Donnamarie Poag Primary Care Provider: Felipa Eth Other Clinician: Referring Provider: Felipa Eth Treating Provider/Extender: Tito Dine in Treatment: 14 Diagnosis  Coding ICD-10 Codes Code Description E11.622 Type 2 diabetes mellitus with other skin ulcer I89.0 Lymphedema, not elsewhere classified I87.2 Venous insufficiency (chronic) (peripheral) L97.822 Non-pressure chronic ulcer of other part of left lower leg with fat layer exposed L97.512 Non-pressure chronic ulcer of other part of right foot with fat layer exposed I10 Essential (primary) hypertension L97.811 Non-pressure chronic ulcer of other part of right lower leg limited to breakdown of skin Facility Procedures CPT4: Description Modifier Quantity Code [id number] BILATERAL: Application of multi-layer venous compression system; leg (below knee), including 1 ankle and foot. Physician Procedures CPT4 Code: [id number] Description: 25852 - WC PHYS LEVEL 4 - EST PT Modifier: Quantity: 1 CPT4 Code: Description: ICD-10 Diagnosis Description L97.822 Non-pressure chronic ulcer of other part of left lower leg with fat layer L97.512 Non-pressure chronic ulcer of other part of right foot with fat layer exp I89.0 Lymphedema, not elsewhere classified Modifier: exposed osed Quantity: Electronic Signature(s) Signed: 05/07/2021 4:08:28 PM By: Linton Ham MD Entered By: Linton Ham on 05/07/2021 12:13:33

## 2021-05-07 NOTE — Progress Notes (Signed)
XIONG, HAIDAR ([id number]) Visit Report for 05/07/2021 Arrival Information Details Patient Name: [name], [name] Date of Service: 05/07/2021 11:15 AM Medical Record Number: [id number] Patient Account Number: [id number] Date of Birth/Sex: Sep 01, 1950 (70 y.o. M) Treating RN: Donnamarie Poag Primary Care Khayman Kirsch: Felipa Eth Other Clinician: Referring Athalene Kolle: Felipa Eth Treating Rahsaan Weakland/Extender: Tito Dine in Treatment: 14 Visit Information History Since Last Visit Added or deleted any medications: No Patient Arrived: [name] Had a fall or experienced change in No Arrival Time: 11:27 activities of daily living that may affect Accompanied By: self risk of falls: Transfer Assistance: None Hospitalized since last visit: No Patient Identification Verified: Yes Has Dressing in Place as Prescribed: Yes Secondary Verification Process Completed: Yes Has Compression in Place as Prescribed: Yes Patient Requires Transmission-Based No Pain Present Now: No Precautions: Patient Has Alerts: Yes Patient Alerts: AVVS consult on file Last ABI-R 1.09; L 1.04 Electronic Signature(s) Signed: 05/07/2021 12:43:37 PM By: Donnamarie Poag Entered By: Donnamarie Poag on 05/07/2021 11:29:51 [name] ([id number]) -------------------------------------------------------------------------------- Compression Therapy Details Patient Name: [name] Date of Service: 05/07/2021 11:15 AM Medical Record Number: [id number] Patient Account Number: [id number] Date of Birth/Sex: 1951-03-10 (70 y.o. M) Treating RN: Donnamarie Poag Primary Care Giulian Goldring: Felipa Eth Other Clinician: Referring Karagan Lehr: Felipa Eth Treating Ruthella Kirchman/Extender: Tito Dine in Treatment: 14 Compression Therapy Performed for Wound Assessment: Wound #17 Left,Anterior Lower Leg Performed By: Junius Argyle, RN Compression Type: Four Layer Post Procedure Diagnosis Same as  Pre-procedure Electronic Signature(s) Signed: 05/07/2021 12:43:37 PM By: Donnamarie Poag Entered By: Donnamarie Poag on 05/07/2021 11:57:12 [name] ([id number]) -------------------------------------------------------------------------------- Compression Therapy Details Patient Name: [name] Date of Service: 05/07/2021 11:15 AM Medical Record Number: [id number] Patient Account Number: [id number] Date of Birth/Sex: November 24, 1950 (69 y.o. M) Treating RN: Donnamarie Poag Primary Care Elianys Conry: Felipa Eth Other Clinician: Referring Rodel Glaspy: Felipa Eth Treating Mccartney Chuba/Extender: Tito Dine in Treatment: 14 Compression Therapy Performed for Wound Assessment: NonWound Condition Lymphedema - Right Leg Performed By: Junius Argyle, RN Compression Type: Four Layer Post Procedure Diagnosis Same as Pre-procedure Electronic Signature(s) Signed: 05/07/2021 12:43:37 PM By: Donnamarie Poag Entered By: Donnamarie Poag on 05/07/2021 11:57:29 [name] ([id number]) -------------------------------------------------------------------------------- Encounter Discharge Information Details Patient Name: [name] Date of Service: 05/07/2021 11:15 AM Medical Record Number: [id number] Patient Account Number: [id number] Date of Birth/Sex: [birth year] (70 y.o. M) Treating RN: Donnamarie Poag Primary Care Calogero Geisen: Felipa Eth Other Clinician: Referring Detric Scalisi: Felipa Eth Treating Casy Tavano/Extender: Tito Dine in Treatment: 14 Encounter Discharge Information Items Discharge Condition: Stable Ambulatory Status: Cane Discharge Destination: Home Transportation: Other Accompanied By: self Schedule Follow-up Appointment: Yes Clinical Summary of Care: Electronic Signature(s) Signed: 05/07/2021 12:43:37 PM By: Donnamarie Poag Entered By: Donnamarie Poag on 05/07/2021 12:17:23 [name]  ([id number]) -------------------------------------------------------------------------------- Lower Extremity Assessment Details Patient Name: [name] Date of Service: 05/07/2021 11:15 AM Medical Record Number: [id number] Patient Account Number: [id number] Date of Birth/Sex: 10/13/1950 (70 y.o. M) Treating RN: Donnamarie Poag Primary Care Marquese Burkland: Felipa Eth Other Clinician: Referring Undrea Shipes: Felipa Eth Treating Shallen Luedke/Extender: Tito Dine in Treatment: 14 Edema Assessment Assessed: Shirlyn Goltz: Yes] Patrice Paradise: Yes] Edema: [Left: Ye] [Right: s] Calf Left: Right: Point of Measurement: 35 cm From Medial Instep 37.5 cm 37 cm Ankle Left: Right: Point of Measurement: 10 cm From Medial Instep 34.5 cm 33 cm Vascular Assessment Pulses: Dorsalis Pedis Palpable: [Left:No] [Right:No] Electronic Signature(s) Signed: 05/07/2021 12:43:37 PM By: Donnamarie Poag Entered By: Donnamarie Poag on 05/07/2021 11:47:48 [name] ([id number]) -------------------------------------------------------------------------------- Multi Wound Chart Details  Patient Name: [name], [name] Date of Service: 05/07/2021 11:15 AM Medical Record Number: [id number] Patient Account Number: [id number] Date of Birth/Sex: [birth year] (70 y.o. M) Treating RN: Donnamarie Poag Primary Care Jeancarlo Leffler: Felipa Eth Other Clinician: Referring Rosealee Recinos: Felipa Eth Treating Ada Holness/Extender: Tito Dine in Treatment: 14 Vital Signs Height(in): 73 Pulse(bpm): 40 Weight(lbs): 312 Blood Pressure(mmHg): 132/85 Body Mass Index(BMI): 41 Temperature(F): 97.7 Respiratory Rate(breaths/min): 16 Photos: [15:No Photos] [N/A:N/A] Wound Location: Left, Medial Lower Leg Left, Anterior Lower Leg N/A Wounding Event: Blister Blister N/A Primary Etiology: Lymphedema Lymphedema N/A Comorbid History: N/A Hypertension, Peripheral Venous N/A Disease, Type II Diabetes, Neuropathy, Received Chemotherapy Date  Acquired: 03/08/2021 04/24/2021 N/A Weeks of Treatment: 8 1 N/A Wound Status: Converted Open N/A Measurements L x W x D (cm) 0x0x0 1x1x0.1 N/A Area (cm) : 0 0.785 N/A Volume (cm) : 0 0.079 N/A % Reduction in Area: 100.00% 97.90% N/A % Reduction in Volume: 100.00% 97.90% N/A Classification: Full Thickness Without Exposed Full Thickness Without Exposed N/A Support Structures Support Structures Exudate Amount: Large Large N/A Exudate Type: Serous Serous N/A Exudate Color: amber amber N/A Granulation Amount: N/A Small (1-33%) N/A Granulation Quality: N/A Red N/A Necrotic Amount: N/A Large (67-100%) N/A Necrotic Tissue: N/A Eschar, Adherent Slough N/A Epithelialization: N/A None N/A Treatment Notes Electronic Signature(s) Signed: 05/07/2021 12:43:37 PM By: Donnamarie Poag Entered By: Donnamarie Poag on 05/07/2021 11:56:39 [name] ([id number]) -------------------------------------------------------------------------------- Multi-Disciplinary Care Plan Details Patient Name: [name] Date of Service: 05/07/2021 11:15 AM Medical Record Number: [id number] Patient Account Number: [id number] Date of Birth/Sex: 04-Mar-1951 (70 y.o. M) Treating RN: Donnamarie Poag Primary Care Kiaraliz Rafuse: Felipa Eth Other Clinician: Referring Val Schiavo: Felipa Eth Treating Mailen Newborn/Extender: Tito Dine in Treatment: 14 Active Inactive Electronic Signature(s) Signed: 05/07/2021 12:43:37 PM By: Donnamarie Poag Entered By: Donnamarie Poag on 05/07/2021 11:48:00 [name] ([id number]) -------------------------------------------------------------------------------- Non-Wound Condition Assessment Details Patient Name: [name] Date of Service: 05/07/2021 11:15 AM Medical Record Number: [id number] Patient Account Number: [id number] Date of Birth/Sex: [birth year] (70 y.o. M) Treating RN: Donnamarie Poag Primary Care Kaity Pitstick: Felipa Eth Other Clinician: Referring Ziaire Bieser: Felipa Eth Treating Dianna Deshler/Extender: Tito Dine in Treatment: 14 Non-Wound Condition: Condition: Lymphedema Location: Leg Side: Left Notes: Anterior aspect of lower leg Photos Electronic Signature(s) Signed: 05/07/2021 12:43:37 PM By: Donnamarie Poag Entered By: Donnamarie Poag on 05/07/2021 11:45:55 [name] ([id number]) -------------------------------------------------------------------------------- Non-Wound Condition Assessment Details Patient Name: [name] Date of Service: 05/07/2021 11:15 AM Medical Record Number: [id number] Patient Account Number: [id number] Date of Birth/Sex: [birth year] (70 y.o. M) Treating RN: Donnamarie Poag Primary Care Beckham Buxbaum: Felipa Eth Other Clinician: Referring Sayde Lish: Felipa Eth Treating Timm Bonenberger/Extender: Ricard Dillon Weeks in Treatment: 14 Non-Wound Condition: Condition: Lymphedema Location: Leg Side: Right Photos Electronic Signature(s) Signed: 05/07/2021 12:43:37 PM By: Donnamarie Poag Entered By: Donnamarie Poag on 05/07/2021 11:46:21 [name] ([id number]) -------------------------------------------------------------------------------- Pain Assessment Details Patient Name: [name] Date of Service: 05/07/2021 11:15 AM Medical Record Number: [id number] Patient Account Number: [id number] Date of Birth/Sex: October 31, 1950 (70 y.o. M) Treating RN: Donnamarie Poag Primary Care Maelani Yarbro: Felipa Eth Other Clinician: Referring Valoree Agent: Felipa Eth Treating Astin Rape/Extender: Tito Dine in Treatment: 14 Active Problems Location of Pain Severity and Description of Pain Patient Has Paino No Site Locations Rate the pain. Current Pain Level: 0 Pain Management and Medication Current Pain Management: Electronic Signature(s) Signed: 05/07/2021 12:43:37 PM By: Donnamarie Poag Entered By: Donnamarie Poag on 05/07/2021 11:39:29 [name]  ([id number]) -------------------------------------------------------------------------------- Patient/Caregiver Education Details Patient Name: [name] Date of Service: 05/07/2021  11:15 AM Medical Record Number: [id number] Patient Account Number: [id number] Date of Birth/Gender: [birth year] (70 y.o. M) Treating RN: Donnamarie Poag Primary Care Physician: Felipa Eth Other Clinician: Referring Physician: Felipa Eth Treating Physician/Extender: Tito Dine in Treatment: 14 Education Assessment Education Provided To: Patient Education Topics Provided Wound/Skin Impairment: Electronic Signature(s) Signed: 05/07/2021 12:43:37 PM By: Donnamarie Poag Entered By: Donnamarie Poag on 05/07/2021 12:02:11 [name] ([id number]) -------------------------------------------------------------------------------- Wound Assessment Details Patient Name: [name] Date of Service: 05/07/2021 11:15 AM Medical Record Number: [id number] Patient Account Number: [id number] Date of Birth/Sex: 03-15-1951 (70 y.o. M) Treating RN: Donnamarie Poag Primary Care Quilla Freeze: Felipa Eth Other Clinician: Referring Raley Novicki: Felipa Eth Treating Mashonda Broski/Extender: Tito Dine in Treatment: 14 Wound Status Wound Number: 15 Primary Etiology: Lymphedema Wound Location: Left, Medial Lower Leg Wound Status: Converted Wounding Event: Blister Date Acquired: 03/08/2021 Weeks Of Treatment: 8 Clustered Wound: No Wound Measurements Length: (cm) Width: (cm) Depth: (cm) Area: (cm) Volume: (cm) 0 % Reduction in Area: 100% 0 % Reduction in Volume: 100% 0 0 0 Wound Description Classification: Full Thickness Without Exposed Support Structu Exudate Amount: Large Exudate Type: Serous Exudate Color: amber res Treatment Notes Wound #15 (Lower Leg) Wound Laterality: Left, Medial Cleanser Peri-Wound Care Topical Primary Dressing Secondary Dressing Secured With Compression  Wrap Compression Stockings Add-Ons Electronic Signature(s) Signed: 05/07/2021 12:43:37 PM By: Donnamarie Poag Entered By: Donnamarie Poag on 05/07/2021 11:43:10 [name] ([id number]) -------------------------------------------------------------------------------- Wound Assessment Details Patient Name: [name] Date of Service: 05/07/2021 11:15 AM Medical Record Number: [id number] Patient Account Number: [id number] Date of Birth/Sex: September 04, 1950 (70 y.o. M) Treating RN: Donnamarie Poag Primary Care Dahlton Hinde: Felipa Eth Other Clinician: Referring Montzerrat Brunell: Felipa Eth Treating Vang Kraeger/Extender: Tito Dine in Treatment: 14 Wound Status Wound Number: 17 Primary Lymphedema Etiology: Wound Location: Left, Anterior Lower Leg Wound Open Wounding Event: Blister Status: Date Acquired: 04/24/2021 Comorbid Hypertension, Peripheral Venous Disease, Type II Weeks Of Treatment: 1 History: Diabetes, Neuropathy, Received Chemotherapy Clustered Wound: No Photos Wound Measurements Length: (cm) 1 Width: (cm) 1 Depth: (cm) 0.1 Area: (cm) 0.785 Volume: (cm) 0.079 % Reduction in Area: 97.9% % Reduction in Volume: 97.9% Epithelialization: None Tunneling: No Undermining: No Wound Description Classification: Full Thickness Without Exposed Support Structures Exudate Amount: Large Exudate Type: Serous Exudate Color: amber Foul Odor After Cleansing: No Slough/Fibrino Yes Wound Bed Granulation Amount: Small (1-33%) Exposed Structure Granulation Quality: Red Fascia Exposed: No Necrotic Amount: Large (67-100%) Fat Layer (Subcutaneous Tissue) Exposed: Yes Necrotic Quality: Eschar, Adherent Slough Tendon Exposed: No Muscle Exposed: No Joint Exposed: No Bone Exposed: No Treatment Notes Wound #17 (Lower Leg) Wound Laterality: Left, Anterior Cleanser Soap and Water Discharge Instruction: Gently cleanse wound with antibacterial soap, rinse and pat dry prior to dressing  wounds Peri-Wound Care [place], [name] ([id number]) Topical Triamcinolone Acetonide Cream, 0.1%, 15 (g) tube Discharge Instruction: to bilateral lower legs-Apply as directed by Kreg Earhart. Moisturizer lotion Primary Dressing Secondary Dressing Zetuvit Plus Silicone Non-bordered 5x5 (in/in) Discharge Instruction: cover feet and any draining areas Secured With Compression Wrap Medichoice 4 layer Compression System, 35-40 mmHG Discharge Instruction: Apply multi-layer wrap as directed. Compression Stockings Add-Ons Electronic Signature(s) Signed: 05/07/2021 12:43:37 PM By: Donnamarie Poag Entered By: Donnamarie Poag on 05/07/2021 11:45:19 [name] ([id number]) -------------------------------------------------------------------------------- [place] Details Patient Name: [name] Date of Service: 05/07/2021 11:15 AM Medical Record Number: [id number] Patient Account Number: [id number] Date of Birth/Sex: 1951/02/14 (70 y.o. M) Treating RN: Donnamarie Poag Primary Care Lennon Boutwell: Felipa Eth Other Clinician: Referring Yonna Alwin: Felipa Eth Treating Dalyce Renne/Extender: Dellia Nims  MICHAEL G Weeks in Treatment: 14 Vital Signs Time Taken: 11:30 Temperature (F): 97.7 Height (in): 73 Pulse (bpm): 71 Weight (lbs): 312 Respiratory Rate (breaths/min): 16 Body Mass Index (BMI): 41.2 Blood Pressure (mmHg): 132/85 Reference Range: 80 - 120 mg / dl Electronic Signature(s) Signed: 05/07/2021 12:43:37 PM By: Donnamarie Poag Entered ByDonnamarie Poag on 05/07/2021 11:31:43

## 2021-05-11 ENCOUNTER — Telehealth: Payer: Self-pay

## 2021-05-11 NOTE — Telephone Encounter (Signed)
Able to reach pt regarding his recent lab work, Dr. Fletcher Anon had a chance to review their results and advised   "Stable renal function on Lasix. The patient needs a follow up visit with Dr. Rockey Situ or APP in few weeks. "  All questions or concerns were address and no additional concerns at this time. Will call back for anything further.    Pt schedule with Cadence Kathlen Mody, PA-C 1/25 at 3:35 PM

## 2021-05-14 ENCOUNTER — Other Ambulatory Visit: Payer: Self-pay

## 2021-05-14 ENCOUNTER — Encounter: Payer: PPO | Attending: Physician Assistant

## 2021-05-14 DIAGNOSIS — E1142 Type 2 diabetes mellitus with diabetic polyneuropathy: Secondary | ICD-10-CM | POA: Insufficient documentation

## 2021-05-14 DIAGNOSIS — I872 Venous insufficiency (chronic) (peripheral): Secondary | ICD-10-CM | POA: Insufficient documentation

## 2021-05-14 DIAGNOSIS — I890 Lymphedema, not elsewhere classified: Secondary | ICD-10-CM | POA: Insufficient documentation

## 2021-05-14 DIAGNOSIS — E11621 Type 2 diabetes mellitus with foot ulcer: Secondary | ICD-10-CM | POA: Diagnosis not present

## 2021-05-14 DIAGNOSIS — I509 Heart failure, unspecified: Secondary | ICD-10-CM | POA: Insufficient documentation

## 2021-05-14 DIAGNOSIS — L97512 Non-pressure chronic ulcer of other part of right foot with fat layer exposed: Secondary | ICD-10-CM | POA: Insufficient documentation

## 2021-05-14 DIAGNOSIS — L97822 Non-pressure chronic ulcer of other part of left lower leg with fat layer exposed: Secondary | ICD-10-CM | POA: Diagnosis not present

## 2021-05-14 DIAGNOSIS — I110 Hypertensive heart disease with heart failure: Secondary | ICD-10-CM | POA: Insufficient documentation

## 2021-05-14 DIAGNOSIS — L97811 Non-pressure chronic ulcer of other part of right lower leg limited to breakdown of skin: Secondary | ICD-10-CM | POA: Diagnosis not present

## 2021-05-14 DIAGNOSIS — I2510 Atherosclerotic heart disease of native coronary artery without angina pectoris: Secondary | ICD-10-CM | POA: Insufficient documentation

## 2021-05-14 DIAGNOSIS — E11622 Type 2 diabetes mellitus with other skin ulcer: Secondary | ICD-10-CM | POA: Insufficient documentation

## 2021-05-14 NOTE — Progress Notes (Signed)
[name], [name] ([id number]) Visit Report for 05/14/2021 Arrival Information Details Patient Name: [name], [name] Date of Service: 05/14/2021 10:45 AM Medical Record Number: [id number] Patient Account Number: [id number] Date of Birth/Sex: [birth year] (71 y.o. M) Treating RN: Cornell Barman Primary Care Oliviagrace Crisanti: Felipa Eth Other Clinician: Referring Larrisha Babineau: Felipa Eth Treating Roselyne Stalnaker/Extender: Skipper Cliche in Treatment: 15 Visit Information History Since Last Visit Added or deleted any medications: No Patient Arrived: Cane Has Compression in Place as Prescribed: Yes Arrival Time: 10:42 Pain Present Now: No Accompanied By: self Transfer Assistance: None Patient Identification Verified: Yes Secondary Verification Process Completed: Yes Patient Requires Transmission-Based No Precautions: Patient Has Alerts: Yes Patient Alerts: AVVS consult on file Last ABI-R 1.09; L 1.04 Electronic Signature(s) Signed: 05/14/2021 5:16:08 PM By: Gretta Cool, BSN, RN, CWS, Kim RN, BSN Entered By: Gretta Cool, BSN, RN, CWS, Kim on 05/14/2021 10:42:49 [name] ([id number]) -------------------------------------------------------------------------------- Compression Therapy Details Patient Name: [name] Date of Service: 05/14/2021 10:45 AM Medical Record Number: [id number] Patient Account Number: [id number] Date of Birth/Sex: [birth year] (71 y.o. M) Treating RN: Cornell Barman Primary Care Jeno Calleros: Felipa Eth Other Clinician: Referring Beauford Lando: Felipa Eth Treating Donnie Panik/Extender: Skipper Cliche in Treatment: 15 Compression Therapy Performed for Wound Assessment: Wound #17 Left,Anterior Lower Leg Performed By: Clinician Cornell Barman, RN Compression Type: Four Layer Pre Treatment ABI: 1.1 Electronic Signature(s) Signed: 05/14/2021 5:16:08 PM By: Gretta Cool, BSN, RN, CWS, Kim RN, BSN Entered By: Gretta Cool, BSN, RN, CWS, Kim on 05/14/2021 11:05:55 [name]  ([id number]) -------------------------------------------------------------------------------- Encounter Discharge Information Details Patient Name: [name] Date of Service: 05/14/2021 10:45 AM Medical Record Number: [id number] Patient Account Number: [id number] Date of Birth/Sex: 1950-10-12 (71 y.o. M) Treating RN: Cornell Barman Primary Care Valentin Benney: Felipa Eth Other Clinician: Referring Mona Ayars: Felipa Eth Treating Ryann Pauli/Extender: Skipper Cliche in Treatment: 15 Encounter Discharge Information Items Discharge Condition: Stable Ambulatory Status: Cane Discharge Destination: Home Transportation: Private Auto Accompanied By: self Schedule Follow-up Appointment: Yes Clinical Summary of Care: Electronic Signature(s) Signed: 05/14/2021 5:16:08 PM By: Gretta Cool, BSN, RN, CWS, Kim RN, BSN Entered By: Gretta Cool, BSN, RN, CWS, Kim on 05/14/2021 11:07:38 [name] ([id number]) -------------------------------------------------------------------------------- Wound Assessment Details Patient Name: [name] Date of Service: 05/14/2021 10:45 AM Medical Record Number: [id number] Patient Account Number: [id number] Date of Birth/Sex: Oct 15, 1950 (71 y.o. M) Treating RN: Cornell Barman Primary Care [place] Behlke: Felipa Eth Other Clinician: Referring Bonham Zingale: Felipa Eth Treating Amariss Detamore/Extender: Jeri Cos Weeks in Treatment: 15 Wound Status Wound Number: 17 Primary Lymphedema Etiology: Wound Location: Left, Anterior Lower Leg Wound Open Wounding Event: Blister Status: Date Acquired: 04/24/2021 Comorbid Hypertension, Peripheral Venous Disease, Type II Weeks Of Treatment: 2 History: Diabetes, Neuropathy, Received Chemotherapy Clustered Wound: No Wound Measurements Length: (cm) 1 Width: (cm) 1 Depth: (cm) 0.1 Area: (cm) 0.785 Volume: (cm) 0.079 % Reduction in Area: 97.9% % Reduction in Volume: 97.9% Epithelialization: None Wound Description Classification: Full  Thickness Without Exposed Support Structures Exudate Amount: Large Exudate Type: Serous Exudate Color: amber Foul Odor After Cleansing: No Slough/Fibrino Yes Wound Bed Granulation Amount: Large (67-100%) Exposed Structure Granulation Quality: Red Fascia Exposed: No Necrotic Amount: Small (1-33%) Fat Layer (Subcutaneous Tissue) Exposed: Yes Necrotic Quality: Adherent Slough Tendon Exposed: No Muscle Exposed: No Joint Exposed: No Bone Exposed: No Treatment Notes Wound #17 (Lower Leg) Wound Laterality: Left, Anterior Cleanser Soap and Water Discharge Instruction: Gently cleanse wound with antibacterial soap, rinse and pat dry prior to dressing wounds Peri-Wound Care Topical Triamcinolone Acetonide Cream, 0.1%, 15 (g) tube Discharge Instruction: to bilateral lower legs-Apply as directed by Coreyon Nicotra.  Moisturizer lotion Primary Dressing Secondary Dressing Zetuvit Plus Silicone Non-bordered 5x5 (in/in) Discharge Instruction: cover feet and any draining areas Secured With Compression Wrap Medichoice 4 layer Compression System, 35-40 mmHG [place], [name] ([id number]) Discharge Instruction: Apply multi-layer wrap as directed. Compression Stockings Environmental education officer) Signed: 05/14/2021 5:16:08 PM By: Gretta Cool, BSN, RN, CWS, Kim RN, BSN Entered By: Gretta Cool, BSN, RN, CWS, Kim on 05/14/2021 11:05:20

## 2021-05-17 ENCOUNTER — Other Ambulatory Visit: Payer: Self-pay

## 2021-05-17 ENCOUNTER — Encounter: Payer: PPO | Admitting: Physician Assistant

## 2021-05-17 DIAGNOSIS — E11622 Type 2 diabetes mellitus with other skin ulcer: Secondary | ICD-10-CM | POA: Diagnosis not present

## 2021-05-17 NOTE — Progress Notes (Addendum)
MOHD., DERFLINGER ([id number]) Visit Report for 05/17/2021 Chief Complaint Document Details Patient Name: [name], SWALLOWS Date of Service: 05/17/2021 12:30 PM Medical Record Number: [id number] Patient Account Number: [id number] Date of Birth/Sex: [birth year] (71 y.o. M) Treating RN: [name] Primary Care Provider: Felipa Eth Other Clinician: Referring Provider: Felipa Eth Treating Provider/Extender: Skipper Cliche in Treatment: 15 Information Obtained from: Patient Chief Complaint Left LE ulcers Electronic Signature(s) Signed: 05/17/2021 12:52:52 PM By: Worthy Keeler PA-C Entered By: Worthy Keeler on 05/17/2021 12:52:52 [name] ([id number]) -------------------------------------------------------------------------------- HPI Details Patient Name: [name] Date of Service: 05/17/2021 12:30 PM Medical Record Number: [id number] Patient Account Number: [id number] Date of Birth/Sex: [birth year] (71 y.o. M) Treating RN: [name] Primary Care Provider: Felipa Eth Other Clinician: Referring Provider: Felipa Eth Treating Provider/Extender: Skipper Cliche in Treatment: 15 History of Present Illness HPI Description: 71 year old male who presented to the ER with bilateral lower extremity blisters which had started last week. he has a past medical history of leukemia, diabetes mellitus, hypertension, edema of both lower extremities, his recurrent skin infections, peripheral vascular disease, coronary artery disease, congestive heart failure and peripheral neuropathy. in the ER he was given Rocephin and put on Silvadene cream. he was put on oral doxycycline and was asked to follow-up with the [facility]. His last hemoglobin A1c was 6.6 in December and he checks his blood sugar once a week. He does not have any physicians outside the [place] system. He does not recall any vascular duplex studies done either for arterial or venous disease but was told to wear compression  stockings which he does not use 05/30/2016 -- we have not yet received any of his notes from the [facility] hospital system and his arterial and venous duplex studies are scheduled here in [place] around mid February. We are unable to have his insurance accepted by home health agencies and hence he is getting dressings only once a week. 06/06/16 -- -- I received a call from the patient's PCP at the [facility] at [facility] and spoke to Dr. Garvin Fila, phone number [phone] and fax number [phone]. She confirmed that no vascular testing was done over the last 5 years and she would be happy to do them if the patient did want them to be done at the [place] and we could fax him a request. Readmission: 71 year old male seen by as in February of this year and was referred to vein and vascular for studies and opinion from the vascular surgeons. The patient returns today with a fresh problem having had blisters on his left lower extremity which have been there for about 5 days and he clearly states that he has been wearing his compression stockings as advised though he could not read the moderate compression and has been wearing light compression. Review of his electronic medical records note that he had lower extremity arterial duplex examination done on 06/23/2016 which showed no hemodynamically significant stenosis in the bilateral lower extremity arterial system. He also had a lower extremity venous reflux examination done on 07/07/2016 and it was noted that he had venous incompetence in the right great saphenous vein and bilateral common femoral veins. Patient was seen by Dr. Tamala Julian on the same day and for some reason his notes do not reflect the venous studies or the arterial studies and he recommended patient do a venous duplex ultrasound to look for reflux and return to see him.he would also consider a lymph pump if required. The patient was told that his workup  was normal and hence the patient canceled his  follow-up appointment. 02/03/17 on evaluation today patient left medial lower extremity blister appears to be doing about the same. It is still continuing to drain and there's still the blistered skin covering the wound bed which is making it difficult for the alternate to do its job. Fortunately there is no evidence of cellulitis. No fevers chills noted. Patient states in general he is not having any significant discomfort. Patient's lower extremity arterial duplex exam revealed that patient was hemodynamically stable with no evidence of stenosis in regard to the bilateral lower extremities. The lower extremity venous reflux exam revealed the patient had venous incontinence noted in the right greater saphenous and bilateral common femoral vein. There is no evidence of deep or superficial vein thrombosis in the bilateral lower extremities. Readmission: 11/12/18 Patient presents for evaluation our clinic today concerning issues that he is having with his left lower extremity. He tells me that a couple weeks ago he began developing blisters on the left lower extremity along with increased swelling. He typically wears his compression stockings on a regular basis is previously been evaluated both here as well is with vascular surgery they would recommend lymphedema pumps but unfortunately that somehow fell through and he never heard anything back from that. Nonetheless I think lymphedema pumps would be beneficial for this patient. He does have a history of hypertension and diabetes. Obviously the chronic venous stasis and lymphedema as well. At this point the blisters have been given in more trouble he states sometimes when the blisters openings able to clean it down with alcohol and it will dry out and do well. Unfortunately that has not been the case this time. He is having some discomfort although this mean these with cleaning the areas he doesn't have discomfort just on a regular basis. He has not been  able to wear his compression stockings since the blisters arose due to the fact that of course it will drain into the socks causing additional issues and he didn't have any way to wrap this otherwise. He has increased to taking his Lasix every day instead of every other day. He sees his primary care provider later this month as well. No fevers, chills, nausea, or vomiting noted at this time. 11/19/18-Patient returns at 1 week, per intake RN the amount of seepage into the compression wraps was definitely improved, overall all the wounds are measuring smaller but continuing silver alginate to the wounds as primary dressing 11/26/18 on evaluation today patient appears to be doing quite well in regard to his left lower [place] ulcers. In fact of the areas that were noted initially he only has two regions still open. There is no evidence of active infection at this time. He still is not heard anything from the company regarding lymphedema pumps as of yet. Again as previously seen vascular they have not recommended any surgical intervention. 12/03/2018 on evaluation today patient actually appears to be doing quite well with regard to his lower extremity ulcers. In fact most of the areas appear to be healed the one spot which does not seem to be completely healed I am unsure of whether or not this is really draining that much but nonetheless there does not appear to be any signs of infection or significant drainage at this point. There is no sign of fever, chills, nausea, vomiting, or diarrhea. Overall I am pleased with how things have progressed I think is very close to being able to transition  to his home compression stockings. MANASSEH, PITTSLEY ([id number]) 12/10/2018 upon evaluation today patient appears to be doing quite well with regard to his left lower extremity. He has been tolerating the dressing changes without complication. Fortunately there is no signs of active infection at this time. He appears after  thorough evaluation of his leg to only have 1 small area that remains open at this point everything else appears to be almost completely closed. He still have significant swelling of the left lower extremity. We had discussed discussing this with his primary care provider he is not able to see her in person they were at the [facility] and right now the [place] is not seeing patients on site. According to the patient anyway. Subsequently he did speak with her apparently and his primary care provider feels that he may likely have a DVT. With that being said she has not seen his leg she is just going off of his history. Nonetheless that is a concern that the patient now has as well and while I do not feel the DVT is likely we can definitely ensure that that is not the case I will go ahead and see about putting that order in today. Nonetheless otherwise I am in a recommend that we continue with the current wound care measures including the compression therapy most likely. We just need to ensure that his leg is indeed free of any DVTs. 12/17/2018 on evaluation today patient actually appears to be completely healed today. He does have 2 very small areas of blistering although this is not anything too significant at this point which is good news. With that being said I am in agreement with the fact that I think he is completely healed at this point. He does want to get back into his compression stocking. The good news is we have gotten approval from insurance for his lymphedema pumps we received a letter since last saw him last week. The other good news is his study did come back and showed no evidence of a DVT. 12/20/2018 on evaluation today patient presents for follow-up concerning his ongoing issues with his left lower extremity. He was actually discharged last Friday and did fairly well until he states blisters opened this morning. He tells me he has been wearing his compression stocking although he has a hard  time getting this on. There does not appear to be any signs of active infection at this time. No fevers, chills, nausea, vomiting, or diarrhea. 12/27/2018 on evaluation today patient appears to be doing very well with regard to his swelling of the left lower extremity the 4 layer compression wrap seems to have been beneficial for him. Fortunately there is no signs of active infection at this time. Patient has been tolerating the compression wrap without complication and his foot swelling in particular appears to be greatly improved. He does still have a wound on the lateral portion of his left leg I believe this is more of a blister that has now reopened. 01/03/2019 on evaluation today patient actually appears to be doing excellent in regard to his left lower extremity. He did receive his compression pumps and is actually use this 7 times since he was last here in the office. On top of the compression wrap he is now roughly 3 cm better at the calf and 2 cm better at the ankle he also states that his foot seem to go an issue better without even having to use a shoe horn. Obviously I  think this is all evidence that he is doing excellent in this regard. The other good news is he does not appear to have anything open today as far as wounds are concerned. 01/15/2019 on evaluation today patient appears to be doing more poorly yet again with regard to his left lower extremity. He has developed new wounds again after being discharged just recently. Unfortunately this continues to be the case that he will heal and then have subsequent new wounds. The last time I was hopeful that he may not end up coming back too quickly especially since he states he has been using his lymphedema pumps along with wearing his compression. Nonetheless he had a blister on the back of his leg that popped up on the left and this has opened up into an ulceration it is quite painful. 01/22/19 on evaluation today patient actually appears  to be doing well with regard to his wound on the left lower extremity. He's been tolerating the dressing changes without complication including the compression wrap in the wound appears to be significantly smaller today which is great news. Overall very pleased in this regard. 01/29/2019 on evaluation today patient appears to be doing well with regard to his left posterior lower extremity ulcer. He has been tolerating the dressing changes without complication. This is not completely healed but is getting much closer. We did order a Farrow wrap 4000 for him he has received this and has it with him today although I am not sure we are quite ready to start him on that as of yet. We are very close. 02/05/2019 on evaluation today patient actually appears to be doing quite well with regard to his left posterior lower extremity ulcer. He still has a very tiny opening remaining but the fortunate thing is he seems to be healing quite nicely. He also did get his Farrow wrap which I am hoping will help with his edema control as well at home. Fortunately there is no evidence of active infection. 02/12/2019 patient and fortunately appears to be doing poorly in regard to his wounds of the left lower extremity. He was very close to healing therefore we attempted to use his Velcro compression wraps continuing with lymphedema pumps at home. Unfortunately that does not seem to have done very well for him. He tells me that he wore them all the time but again I am not sure why if that is the case that he is having such significant edema. He is still on his fluid pills as well. With that being said there is no obvious sign of infection although I do wonder about the possibility of infection at this time as well. 02/19/2019 unfortunately upon evaluation today patient appears to be doing more poorly with regard to his left lower extremity. He is not showing signs of significant improvement and I think the biggest issue here is  that he does have an infection that appears to likely be Pseudomonas. That is based on the blue-green drainage that were noted at this time. Unfortunately the antibiotic that has been on is not going to take care of this at all. I think they will get a need to switch him to either Levaquin or Cipro and this was discussed with the patient. 02/26/2019 on evaluation today patient's lower extremity on the left appears to be doing significantly better as compared to last evaluation. Fortunately there is no signs of active infection at this time. He has been tolerating the compression wrap without complication in  fact he made it the whole week at this point. He is showing signs of excellent improvement I am very happy in this regard. With that being said he is having some issues with infection we did review the results of his culture which I noted today. He did have a positive finding for Enterobacter as well as Alcaligenes faecalis. Fortunately the Levaquin that I placed him on will work for both which is great news. There is no signs of systemic infection at this point. 10/30; left posterior leg wound in the setting of very significant edema and what looks like chronic venous inflammation. He has compression pumps but does not use them. We have been using 3 layer compression. Silver alginate to the wound as the primary dressing 03/18/2019 on evaluation today patient appears to be doing a little better compared to last time I saw him. He really has not been using his compression pumps he tells me that he is having too much discomfort. He has been keeping his wraps on however. He is only been taking his fluid pills every other day because he states they are not really helping and he has an appointment with his primary care provider at the [facility] tomorrow. Subsequently the wound itself on the left lower extremity does seem to be greatly improved compared to previous. 03/25/2019 on evaluation today patient appears  to be doing better with regard to his wounds on the bilateral lower extremities. The left is doing excellent the right is also doing better although both still do show some signs of open wounds noted at this point unfortunately. Fortunately there is no signs of active infection at this time. The patient also is not really having any significant pain which is good news. Unfortunately there was some confusion with the referral on vascular disease and as far as getting the patient scheduled there can be contacting him later today to do this ELIJAN, GOOGE ([id number]) fortunately we got this straightened out. 04/01/2019 on evaluation today patient appears to be doing no fevers, chills, nausea, vomiting, or diarrhea. Excellent at this time with regard to his lower extremities. There does not appear to be any open wound at this point which is good news. Fortunately is also no signs of active infection at this time. Overall feel like the patient has done excellent with the compression the problem is every time we got him to this point and then subsequently go to using his own compression things just go right back to where they were. I am not sure how to address this we can try to get an appointment with vascular for 2 weeks now they have yet to call him. Obviously this has become frustrating for the patient as well. I think the issue has just been an honest error as far as scheduling is concerned but nonetheless still worn out the point where I am unsure of which direction we should take. 04/08/2019 on evaluation today patient actually appears to be doing well with regard to his lower extremities. There are no open wounds at this time and things seem to be managing quite nicely as far as the overall edema control is concerned. With that being said he does have his compression socks today for [place] to go ahead and reinitiate therapy in that manner at this point. He is going to be going for shoes to be  measured on Wednesday and then coincidentally he will also be seeing vascular on Thursday. Overall I think this is good news and  again I am hopeful that they will be able to do something for him to help prevent ongoing issues with edema control as well. No fevers, chills, nausea, vomiting, or diarrhea. 04/11/2019 on evaluation today patient actually appears to be doing poorly after just being discharged on Monday of this week. He had been experiencing issues with again blisters especially on the left lower extremity. With that being said he was completely healed and appeared to be doing great this past Monday. He then subsequently has new blisters that formed before his appointment with vascular this morning. He was also measured for shoes in the interim. With that being said we may have figured out what exactly is going on and why he continues to have issues like what we are seeing at this point. He takes his compression stockings off at nighttime and then he ends up having to sleep in his chair for 5-6 hours a night. He sleeps with his feet down he cannot really get him up in the recliner and therefore he is sleeping and the worst possible his position with his feet on the floor for that majority of the time. Again as I explained to him that is about one third at minimum at least one fourth of his day that he spending with his feet dangling down on the ground and the worst possible position they could be. I think this may be what is causing the issue. Subsequently I am leaning toward thinking that he may need a hospital bed in order to elevate his legs. We likely can have to coordinate this with his primary care provider at the [facility]. Readmission: 01/26/2021 this is a patient who presents for repeat evaluation here in the clinic although it is actually been couple of years since have seen him in fact it was December 2020 when I last saw him. Subsequently he never really healed but did end up  being lost to follow-up. He tells me has been having issues ongoing with his lower extremities has bilateral lower extremity lymphedema no real significant or definitive open wounds but in general his lymphedema is way out of control. We were never able to refer him to lymphedema clinic simply due to the fact to be honest we were never able to get him completely healed. I do not see anyone with open wounds. The patient does have evidence of type 2 diabetes mellitus, lymphedema, chronic venous insufficiency, and hypertension. That really has not changed since his last evaluation. 02/09/2021 upon evaluation today patient appears to be doing a little better in regard to his legs although he still having a tremendous amount of drainage especially on the left leg. Fortunately there does not appear to be any evidence of active infection. Of note when we looked into this further it appears that the patient did not have any absorptive dressing on it was just the 4-layer compression wrap. Nonetheless this is probably big part of the issue here. 10/10; he comes in today with 3 large areas on the upper right lower leg likely remanence of denuded blistering under his compression wraps. He has no other wounds on the right. On the left he has the denuded area on the left medial foot and ankle and on the left dorsal foot. Massive lymphedema in both feet dorsally. Using Zetuvit under compression We have increased home health visitation to twice a week to change the dressings and will change it once 02/22/2021 upon evaluation today patient appears to be doing well currently  with regard to his wounds. He has been tolerating the dressing changes without complication. Fortunately there does not appear to be any evidence of active infection which is great news. No fevers, chills, nausea, vomiting, or diarrhea. The biggest issue I see currently is that home health is not putting any medicine on the actual wounds  before wrapping. 03/01/2021 upon evaluation today the patient's right leg actually appears to be doing quite well which is great news there does not appear to be any evidence of active infection at this time. No fevers, chills, nausea, vomiting, or diarrhea. With that being said the patient is having issues on the left foot where he is having significant drainage is also an ammonia smell he does not have any animals at home and this makes me concerned about a bacteria producing urea as a byproduct. Again the possible common organisms will be E. coli, Proteus, and Enterococcus. All 3 of which can be successfully treated with Levaquin. For that reason I think that this may be a good option for [place] to consider placing him on and I did obtain a culture as well for confirmation sake. 03/08/2021 upon evaluation today patient appears to be doing unfortunately still somewhat poorly in regard to his leg ulcerations. He actually has an area on the right leg where he blistered due to the fact that his wrap slid down and caused an area of pinching on his skin and this has led to a significant issue here. 03/15/2021 upon evaluation today patient unfortunately has not been wrapped appropriately with absorptive dressings nor with the appropriate technique for the third layer of the 4-layer compression wrap. These are issues that we continue to try to address with the home health nurse. Also the absorptive dressing that she had was cut in half and therefore that causes things to leak out it does not actually trap the fluid in regard to the top of the foot overall I think that all these combined are really not seeing things improved significantly here. Fortunately there does not appear to be any signs of significant infection at this time which is good news. He still is having a tremendous amount of drainage. 03/22/2021 upon evaluation today patient appears to be draining tremendously. He still continues to tell me  that he is using his pumps 2 times a day and that coupled with that tells me that he is elevating his legs as well. With that being said all things considered I am really just not seeing the improvement we would expect to see with the 4-layer compression wrap and all the above noted. He in fact had an extremely large Zetuvit dressing on both legs and that they were extremely filled to the max with fluid. This is after just being changed just before the weekend and this is Monday. Nonetheless I am concerned about the fact that there is something going on fairly significant that we cannot get any of this under control and that he is draining this significantly. He supposed be having an echocardiogram it sounds like scheduling has been an issue for him as far as getting in sooner. Its something to do with needing his cousin to drive him because of where it sat and he cannot drive himself to this appointment either way I really think he needs to try to see what he can do about making this happen a little sooner. He tells me he will call today. SHIHEEM, CORPORAN ([id number]) 03/29/2021 on evaluation today patient appears to be  doing about the same in regard to his legs. He did get his cardiology appointment moved up to 6 December which is at least good that is better than what it was before mid December. Overall very pleased in that regard. 04/05/2021 upon evaluation today patient unfortunately is still doing fairly poorly. There does not appear to be any signs of active infection at this time. No fevers, chills, nausea, vomiting, or diarrhea. Unfortunately I think until his edema is under control and overall fluid overload there is really not to be much chance that I can do much to get him better. This is quite unfortunate and frustrating both for myself and the patient to be perfectly honest. Nonetheless I think that he really needs to have a conversation both with his primary care provider as well as  cardiologist he sees the PCP on Monday and cardiology on Wednesday of next week. 04/13/2021 upon evaluation today patient appears to be doing poorly in regard to his bilateral lower extremities his left is still worse than the right. With that being said he has a tremendous amount of drainage he did see his primary care provider yesterday there really was not much there to be done from their perspective. He sees cardiology tomorrow. Nonetheless my biggest concern here is simply that if we do not get the edema under control he is going to continue to have drainage and honestly I think at some point he is going to become infected severely that is my main concern. 04/19/2021 upon evaluation today patient appears to be doing poorly still in regard to his legs. Unfortunately there does not appear to be any signs of infection at this point. He does have a tremendous amount of drainage however. We have not seen the results back from the cardiologist and the echocardiogram that was done. It appears that the patient checked out okay as far as that is concerned with regard to ejection fraction though we still have some issues here to be honest with his diastolic function. I am unsure if this is accounting for everything that we are seeing or not. Either way he has a tremendous amount of drainage from his legs that we are just not able to control in the outpatient setting at this point. I have reached out to Dr. Rockey Situ his cardiologist to see once he reviews the sheet if there is anything that he feels like can be done from an outpatient perspective if not then I think the way to go is probably can to be through inpatient admission and diuresis. Otherwise I am not sure how working to get this under control we tried antibiotics, compression wrapping, and I have told the patient to be elevating his legs I am not sure how much he does of this but either way I think that this is still an ongoing issue  nonetheless. 04/26/2021 upon evaluation today patient appears to be doing poorly in regard to his legs. He is having a tremendous amount of fluid at this point which is quite unfortunate. Its to the point that he may have had at least 5 to 10 pounds of fluid in his dressings this morning when they were removed these were changed this Friday. Subsequently I think he needs to go to the ER for further evaluation and treatment I think is probably can need diuresis possibly even IV antibiotics been on what the blood work looks like but in general I feel like he needs something to get this under control from  an outpatient perspective absent of everywhere I can think of and I cannot get this under control with our traditional measures. I think this is going require more so that we can get him better 12/30; this is a patient with severe bilateral lymphedema. He was hospitalized from 04/26/2021 through 04/29/2021 treated for cellulitis in the setting of lower extremity ulcers and lymphedema. After he left the hospital he is apparently seen for nurse visit our staff contacted cardiology and he has been started on Lasix 40 mg. Apparently his legs have less edema. Lab work from 05/04/2021 showed a BUN of 38 and creatinine of 1.59 these are elevated versus previous where his creatinine seems to have been 1.30 on 12/19 his potassium is 4.3. I believe the lab work is being followed by cardiology We have him in a 4-layer wrap. Xeroform on the leg wounds and sit to fit on the Berry damage skin on the left dorsal foot versus right dorsal foot. He has compression pumps but does not use them. We have apparently not yet ordered him compression stockings 05/17/2021 upon evaluation today patient's legs though better than last time I personally saw him appear to be getting worse compared to where they were previous. Dr. Quentin Cornwall was actually last 1 to see you I have not seen him since 19 December. That was before he went into  the ER. Coming out apparently his legs looked also and they still look better but not as good as they were in the past. Electronic Signature(s) Signed: 05/17/2021 1:15:42 PM By: Worthy Keeler PA-C Entered By: Worthy Keeler on 05/17/2021 13:15:42 [name] ([id number]) -------------------------------------------------------------------------------- Physical Exam Details Patient Name: [name] Date of Service: 05/17/2021 12:30 PM Medical Record Number: [id number] Patient Account Number: [id number] Date of Birth/Sex: 1950/06/22 (71 y.o. M) Treating RN: [name] Primary Care Provider: Felipa Eth Other Clinician: Referring Provider: Felipa Eth Treating Provider/Extender: Skipper Cliche in Treatment: 15 Constitutional Obese and well-hydrated in no acute distress. Respiratory normal breathing without difficulty. Psychiatric this patient is able to make decisions and demonstrates good insight into disease process. Alert and Oriented x 3. pleasant and cooperative. Notes Upon inspection patient's wound bed actually showed signs of significant dry skin and bilateral lymphedema he does have a lot of fluid starting to build up especially on the left foot compared to where he came out of the hospital at. The right foot is still doing better although it is also getting worse. Nonetheless I do believe that if were not careful here this is going to continue to trend back towards being worse as it was previous. Electronic Signature(s) Signed: 05/17/2021 1:16:15 PM By: Worthy Keeler PA-C Entered By: Worthy Keeler on 05/17/2021 13:16:15 [name] ([id number]) -------------------------------------------------------------------------------- Physician Orders Details Patient Name: [name] Date of Service: 05/17/2021 12:30 PM Medical Record Number: [id number] Patient Account Number: [id number] Date of Birth/Sex: 31-Jul-1950 (71 y.o. M) Treating RN: [name] Primary  Care Provider: Felipa Eth Other Clinician: Referring Provider: Felipa Eth Treating Provider/Extender: Skipper Cliche in Treatment: 15 Verbal / Phone Orders: No Diagnosis Coding ICD-10 Coding Code Description E11.622 Type 2 diabetes mellitus with other skin ulcer I89.0 Lymphedema, not elsewhere classified I87.2 Venous insufficiency (chronic) (peripheral) L97.822 Non-pressure chronic ulcer of other part of left lower leg with fat layer exposed L97.512 Non-pressure chronic ulcer of other part of right foot with fat layer exposed I10 Essential (primary) hypertension L97.811 Non-pressure chronic ulcer of other part of right lower leg limited to breakdown  of skin Follow-up Appointments o Return Appointment in 1 week. o Nurse Visit as needed [facility] for wound care. May utilize formulary equivalent dressing for wound treatment orders unless otherwise specified. Home Health Nurse may visit PR[facility] to address patientos wound care needs. - frequency 3 times per week - patient will be seen once at wound center - home health to see patient 2 times per week Bathing/ Shower/ Hygiene o May shower with wound dressing protected with water repellent cover or cast protector. o No tub bath. Edema Control - Lymphedema / Segmental Compressive Device / Other Bilateral Lower Extremities o Optional: One layer of unna paste to top of compression wrap (to act as an anchor). - Unna paste on feet and calf to secure wrap in place o 4 Layer Compression System Lymphedema. - bilateral legs bi lat, apply zetuvits/ or formulary for extra absorbant dressing around ankles and foot area, 3 times per week COTTON LAYER SPIRAL, LIGHT TAN SPIRAL. WHITE WITH YELLOW LINE FIGURE 8 , COBAN SPIRAL o Elevate, Exercise Daily and Avoid Standing for Long Periods of Time. o Elevate legs to the level of the heart and pump ankles as often as  possible o Elevate leg(s) parallel to the floor when sitting. o Compression Pump: Use compression pump on left lower extremity for 60 minutes, twice daily. - 2 times per day o DO YOUR BEST to sleep in the bed at night. DO NOT sleep in your recliner. Long hours of sitting in a recliner leads to swelling of the legs and/or potential wounds on your backside. o Other: - Contact prescriber regarding use of diuretics to reduce fluid overload. Off-Loading Bilateral Lower Extremities o Open toe surgical shoe Wound Treatment Wound #17 - Lower Leg Wound Laterality: Left, Anterior Cleanser: Soap and Water 3 x Per Week/30 Days Discharge Instructions: Gently cleanse wound with antibacterial soap, rinse and pat dry prior to dressing wounds Secondary Dressing: Zetuvit Plus Silicone Non-bordered 5x5 (in/in) 3 x Per Week/30 Days Discharge Instructions: cover feet and any draining areas Compression Wrap: Medichoice 4 layer Compression System, 35-40 mmHG 3 x Per Week/30 Days Discharge Instructions: Apply multi-layer wrap as directed. ARRAN, FESSEL ([id number]) Electronic Signature(s) Signed: 05/17/2021 3:33:26 PM By: Worthy Keeler PA-C Signed: 05/18/2021 8:45:44 AM By: [name] RN Entered By: [name] on 05/17/2021 13:11:08 [name] ([id number]) -------------------------------------------------------------------------------- Problem List Details Patient Name: [name] Date of Service: 05/17/2021 12:30 PM Medical Record Number: [id number] Patient Account Number: [id number] Date of Birth/Sex: 01-07-1951 (71 y.o. M) Treating RN: [name] Primary Care Provider: Felipa Eth Other Clinician: Referring Provider: Felipa Eth Treating Provider/Extender: Skipper Cliche in Treatment: 15 Active Problems ICD-10 Encounter Code Description Active Date MDM Diagnosis E11.622 Type 2 diabetes mellitus with other skin ulcer 01/26/2021 No Yes I89.0 Lymphedema, not elsewhere  classified 01/26/2021 No Yes I87.2 Venous insufficiency (chronic) (peripheral) 01/26/2021 No Yes L97.822 Non-pressure chronic ulcer of other part of left lower leg with fat layer 01/26/2021 No Yes exposed L97.512 Non-pressure chronic ulcer of other part of right foot with fat layer 01/26/2021 No Yes exposed Volo (primary) hypertension 01/26/2021 No Yes L97.811 Non-pressure chronic ulcer of other part of right lower leg limited to 02/15/2021 No Yes breakdown of skin Inactive Problems Resolved Problems Electronic Signature(s) Signed: 05/17/2021 12:52:23 PM By: Worthy Keeler PA-C Entered By: Worthy Keeler on 05/17/2021 12:52:23 [name] ([id number]) -------------------------------------------------------------------------------- Progress Note Details Patient Name: [name] Date of Service:  05/17/2021 12:30 PM Medical Record Number: [id number] Patient Account Number: [id number] Date of Birth/Sex: 12-16-1950 (70 y.o. M) Treating RN: [name] Primary Care Provider: Felipa Eth Other Clinician: Referring Provider: Felipa Eth Treating Provider/Extender: Skipper Cliche in Treatment: 15 Subjective Chief Complaint Information obtained from Patient Left LE ulcers History of Present Illness (HPI) 71 year old male who presented to the ER with bilateral lower extremity blisters which had started last week. he has a past medical history of leukemia, diabetes mellitus, hypertension, edema of both lower extremities, his recurrent skin infections, peripheral vascular disease, coronary artery disease, congestive heart failure and peripheral neuropathy. in the ER he was given Rocephin and put on Silvadene cream. he was put on oral doxycycline and was asked to follow-up with the [facility]. His last hemoglobin A1c was 6.6 in December and he checks his blood sugar once a week. He does not have any physicians outside the [place] system. He does not recall any vascular duplex  studies done either for arterial or venous disease but was told to wear compression stockings which he does not use 05/30/2016 -- we have not yet received any of his notes from the [facility] hospital system and his arterial and venous duplex studies are scheduled here in [place] around mid February. We are unable to have his insurance accepted by home health agencies and hence he is getting dressings only once a week. 06/06/16 -- -- I received a call from the patient's PCP at the [facility] and spoke to Dr. Garvin Fila, phone number [phone] and fax number [phone]. She confirmed that no vascular testing was done over the last 5 years and she would be happy to do them if the patient did want them to be done at the [place] and we could fax him a request. Readmission: 71 year old male seen by as in February of this year and was referred to vein and vascular for studies and opinion from the vascular surgeons. The patient returns today with a fresh problem having had blisters on his left lower extremity which have been there for about 5 days and he clearly states that he has been wearing his compression stockings as advised though he could not read the moderate compression and has been wearing light compression. Review of his electronic medical records note that he had lower extremity arterial duplex examination done on 06/23/2016 which showed no hemodynamically significant stenosis in the bilateral lower extremity arterial system. He also had a lower extremity venous reflux examination done on 07/07/2016 and it was noted that he had venous incompetence in the right great saphenous vein and bilateral common femoral veins. Patient was seen by Dr. Tamala Julian on the same day and for some reason his notes do not reflect the venous studies or the arterial studies and he recommended patient do a venous duplex ultrasound to look for reflux and return to see him.he would also consider a lymph pump if  required. The patient was told that his workup was normal and hence the patient canceled his follow-up appointment. 02/03/17 on evaluation today patient left medial lower extremity blister appears to be doing about the same. It is still continuing to drain and there's still the blistered skin covering the wound bed which is making it difficult for the alternate to do its job. Fortunately there is no evidence of cellulitis. No fevers chills noted. Patient states in general he is not having any significant discomfort. Patient's lower extremity arterial duplex exam revealed that patient was hemodynamically stable with  no evidence of stenosis in regard to the bilateral lower extremities. The lower extremity venous reflux exam revealed the patient had venous incontinence noted in the right greater saphenous and bilateral common femoral vein. There is no evidence of deep or superficial vein thrombosis in the bilateral lower extremities. Readmission: 11/12/18 Patient presents for evaluation our clinic today concerning issues that he is having with his left lower extremity. He tells me that a couple weeks ago he began developing blisters on the left lower extremity along with increased swelling. He typically wears his compression stockings on a regular basis is previously been evaluated both here as well is with vascular surgery they would recommend lymphedema pumps but unfortunately that somehow fell through and he never heard anything back from that. Nonetheless I think lymphedema pumps would be beneficial for this patient. He does have a history of hypertension and diabetes. Obviously the chronic venous stasis and lymphedema as well. At this point the blisters have been given in more trouble he states sometimes when the blisters openings able to clean it down with alcohol and it will dry out and do well. Unfortunately that has not been the case this time. He is having some discomfort although this mean these  with cleaning the areas he doesn't have discomfort just on a regular basis. He has not been able to wear his compression stockings since the blisters arose due to the fact that of course it will drain into the socks causing additional issues and he didn't have any way to wrap this otherwise. He has increased to taking his Lasix every day instead of every other day. He sees his primary care provider later this month as well. No fevers, chills, nausea, or vomiting noted at this time. 11/19/18-Patient returns at 1 week, per intake RN the amount of seepage into the compression wraps was definitely improved, overall all the wounds are measuring smaller but continuing silver alginate to the wounds as primary dressing 11/26/18 on evaluation today patient appears to be doing quite well in regard to his left lower Trinity ulcers. In fact of the areas that were noted initially he only has two regions still open. There is no evidence of active infection at this time. He still is not heard anything from the company regarding lymphedema pumps as of yet. Again as previously seen vascular they have not recommended any surgical intervention. AZREAL, STTHOMAS ([id number]) 12/03/2018 on evaluation today patient actually appears to be doing quite well with regard to his lower extremity ulcers. In fact most of the areas appear to be healed the one spot which does not seem to be completely healed I am unsure of whether or not this is really draining that much but nonetheless there does not appear to be any signs of infection or significant drainage at this point. There is no sign of fever, chills, nausea, vomiting, or diarrhea. Overall I am pleased with how things have progressed I think is very close to being able to transition to his home compression stockings. 12/10/2018 upon evaluation today patient appears to be doing quite well with regard to his left lower extremity. He has been tolerating the dressing changes without  complication. Fortunately there is no signs of active infection at this time. He appears after thorough evaluation of his leg to only have 1 small area that remains open at this point everything else appears to be almost completely closed. He still have significant swelling of the left lower extremity. We had discussed discussing this with  his primary care provider he is not able to see her in person they were at the [facility] and right now the [place] is not seeing patients on site. According to the patient anyway. Subsequently he did speak with her apparently and his primary care provider feels that he may likely have a DVT. With that being said she has not seen his leg she is just going off of his history. Nonetheless that is a concern that the patient now has as well and while I do not feel the DVT is likely we can definitely ensure that that is not the case I will go ahead and see about putting that order in today. Nonetheless otherwise I am in a recommend that we continue with the current wound care measures including the compression therapy most likely. We just need to ensure that his leg is indeed free of any DVTs. 12/17/2018 on evaluation today patient actually appears to be completely healed today. He does have 2 very small areas of blistering although this is not anything too significant at this point which is good news. With that being said I am in agreement with the fact that I think he is completely healed at this point. He does want to get back into his compression stocking. The good news is we have gotten approval from insurance for his lymphedema pumps we received a letter since last saw him last week. The other good news is his study did come back and showed no evidence of a DVT. 12/20/2018 on evaluation today patient presents for follow-up concerning his ongoing issues with his left lower extremity. He was actually discharged last Friday and did fairly well until he states blisters opened  this morning. He tells me he has been wearing his compression stocking although he has a hard time getting this on. There does not appear to be any signs of active infection at this time. No fevers, chills, nausea, vomiting, or diarrhea. 12/27/2018 on evaluation today patient appears to be doing very well with regard to his swelling of the left lower extremity the 4 layer compression wrap seems to have been beneficial for him. Fortunately there is no signs of active infection at this time. Patient has been tolerating the compression wrap without complication and his foot swelling in particular appears to be greatly improved. He does still have a wound on the lateral portion of his left leg I believe this is more of a blister that has now reopened. 01/03/2019 on evaluation today patient actually appears to be doing excellent in regard to his left lower extremity. He did receive his compression pumps and is actually use this 7 times since he was last here in the office. On top of the compression wrap he is now roughly 3 cm better at the calf and 2 cm better at the ankle he also states that his foot seem to go an issue better without even having to use a shoe horn. Obviously I think this is all evidence that he is doing excellent in this regard. The other good news is he does not appear to have anything open today as far as wounds are concerned. 01/15/2019 on evaluation today patient appears to be doing more poorly yet again with regard to his left lower extremity. He has developed new wounds again after being discharged just recently. Unfortunately this continues to be the case that he will heal and then have subsequent new wounds. The last time I was hopeful that he may not  end up coming back too quickly especially since he states he has been using his lymphedema pumps along with wearing his compression. Nonetheless he had a blister on the back of his leg that popped up on the left and this has opened up  into an ulceration it is quite painful. 01/22/19 on evaluation today patient actually appears to be doing well with regard to his wound on the left lower extremity. He's been tolerating the dressing changes without complication including the compression wrap in the wound appears to be significantly smaller today which is great news. Overall very pleased in this regard. 01/29/2019 on evaluation today patient appears to be doing well with regard to his left posterior lower extremity ulcer. He has been tolerating the dressing changes without complication. This is not completely healed but is getting much closer. We did order a Farrow wrap 4000 for him he has received this and has it with him today although I am not sure we are quite ready to start him on that as of yet. We are very close. 02/05/2019 on evaluation today patient actually appears to be doing quite well with regard to his left posterior lower extremity ulcer. He still has a very tiny opening remaining but the fortunate thing is he seems to be healing quite nicely. He also did get his Farrow wrap which I am hoping will help with his edema control as well at home. Fortunately there is no evidence of active infection. 02/12/2019 patient and fortunately appears to be doing poorly in regard to his wounds of the left lower extremity. He was very close to healing therefore we attempted to use his Velcro compression wraps continuing with lymphedema pumps at home. Unfortunately that does not seem to have done very well for him. He tells me that he wore them all the time but again I am not sure why if that is the case that he is having such significant edema. He is still on his fluid pills as well. With that being said there is no obvious sign of infection although I do wonder about the possibility of infection at this time as well. 02/19/2019 unfortunately upon evaluation today patient appears to be doing more poorly with regard to his left lower  extremity. He is not showing signs of significant improvement and I think the biggest issue here is that he does have an infection that appears to likely be Pseudomonas. That is based on the blue-green drainage that were noted at this time. Unfortunately the antibiotic that has been on is not going to take care of this at all. I think they will get a need to switch him to either Levaquin or Cipro and this was discussed with the patient. 02/26/2019 on evaluation today patient's lower extremity on the left appears to be doing significantly better as compared to last evaluation. Fortunately there is no signs of active infection at this time. He has been tolerating the compression wrap without complication in fact he made it the whole week at this point. He is showing signs of excellent improvement I am very happy in this regard. With that being said he is having some issues with infection we did review the results of his culture which I noted today. He did have a positive finding for Enterobacter as well as Alcaligenes faecalis. Fortunately the Levaquin that I placed him on will work for both which is great news. There is no signs of systemic infection at this point. 10/30; left posterior leg wound  in the setting of very significant edema and what looks like chronic venous inflammation. He has compression pumps but does not use them. We have been using 3 layer compression. Silver alginate to the wound as the primary dressing 03/18/2019 on evaluation today patient appears to be doing a little better compared to last time I saw him. He really has not been using his compression pumps he tells me that he is having too much discomfort. He has been keeping his wraps on however. He is only been taking his fluid pills every other day because he states they are not really helping and he has an appointment with his primary care provider at the [facility] tomorrow. Subsequently the wound itself on the left lower extremity  does seem to be greatly improved compared to previous. RON, BESKE ([id number]) 03/25/2019 on evaluation today patient appears to be doing better with regard to his wounds on the bilateral lower extremities. The left is doing excellent the right is also doing better although both still do show some signs of open wounds noted at this point unfortunately. Fortunately there is no signs of active infection at this time. The patient also is not really having any significant pain which is good news. Unfortunately there was some confusion with the referral on vascular disease and as far as getting the patient scheduled there can be contacting him later today to do this fortunately we got this straightened out. 04/01/2019 on evaluation today patient appears to be doing no fevers, chills, nausea, vomiting, or diarrhea. Excellent at this time with regard to his lower extremities. There does not appear to be any open wound at this point which is good news. Fortunately is also no signs of active infection at this time. Overall feel like the patient has done excellent with the compression the problem is every time we got him to this point and then subsequently go to using his own compression things just go right back to where they were. I am not sure how to address this we can try to get an appointment with vascular for 2 weeks now they have yet to call him. Obviously this has become frustrating for the patient as well. I think the issue has just been an honest error as far as scheduling is concerned but nonetheless still worn out the point where I am unsure of which direction we should take. 04/08/2019 on evaluation today patient actually appears to be doing well with regard to his lower extremities. There are no open wounds at this time and things seem to be managing quite nicely as far as the overall edema control is concerned. With that being said he does have his compression socks today for [place] to go ahead  and reinitiate therapy in that manner at this point. He is going to be going for shoes to be measured on Wednesday and then coincidentally he will also be seeing vascular on Thursday. Overall I think this is good news and again I am hopeful that they will be able to do something for him to help prevent ongoing issues with edema control as well. No fevers, chills, nausea, vomiting, or diarrhea. 04/11/2019 on evaluation today patient actually appears to be doing poorly after just being discharged on Monday of this week. He had been experiencing issues with again blisters especially on the left lower extremity. With that being said he was completely healed and appeared to be doing great this past Monday. He then subsequently has new blisters that formed before  his appointment with vascular this morning. He was also measured for shoes in the interim. With that being said we may have figured out what exactly is going on and why he continues to have issues like what we are seeing at this point. He takes his compression stockings off at nighttime and then he ends up having to sleep in his chair for 5-6 hours a night. He sleeps with his feet down he cannot really get him up in the recliner and therefore he is sleeping and the worst possible his position with his feet on the floor for that majority of the time. Again as I explained to him that is about one third at minimum at least one fourth of his day that he spending with his feet dangling down on the ground and the worst possible position they could be. I think this may be what is causing the issue. Subsequently I am leaning toward thinking that he may need a hospital bed in order to elevate his legs. We likely can have to coordinate this with his primary care provider at the [facility]. Readmission: 01/26/2021 this is a patient who presents for repeat evaluation here in the clinic although it is actually been couple of years since have seen him in fact it  was December 2020 when I last saw him. Subsequently he never really healed but did end up being lost to follow-up. He tells me has been having issues ongoing with his lower extremities has bilateral lower extremity lymphedema no real significant or definitive open wounds but in general his lymphedema is way out of control. We were never able to refer him to lymphedema clinic simply due to the fact to be honest we were never able to get him completely healed. I do not see anyone with open wounds. The patient does have evidence of type 2 diabetes mellitus, lymphedema, chronic venous insufficiency, and hypertension. That really has not changed since his last evaluation. 02/09/2021 upon evaluation today patient appears to be doing a little better in regard to his legs although he still having a tremendous amount of drainage especially on the left leg. Fortunately there does not appear to be any evidence of active infection. Of note when we looked into this further it appears that the patient did not have any absorptive dressing on it was just the 4-layer compression wrap. Nonetheless this is probably big part of the issue here. 10/10; he comes in today with 3 large areas on the upper right lower leg likely remanence of denuded blistering under his compression wraps. He has no other wounds on the right. On the left he has the denuded area on the left medial foot and ankle and on the left dorsal foot. Massive lymphedema in both feet dorsally. Using Zetuvit under compression We have increased home health visitation to twice a week to change the dressings and will change it once 02/22/2021 upon evaluation today patient appears to be doing well currently with regard to his wounds. He has been tolerating the dressing changes without complication. Fortunately there does not appear to be any evidence of active infection which is great news. No fevers, chills, nausea, vomiting, or diarrhea. The biggest issue I see  currently is that home health is not putting any medicine on the actual wounds before wrapping. 03/01/2021 upon evaluation today the patient's right leg actually appears to be doing quite well which is great news there does not appear to be any evidence of active infection at this  time. No fevers, chills, nausea, vomiting, or diarrhea. With that being said the patient is having issues on the left foot where he is having significant drainage is also an ammonia smell he does not have any animals at home and this makes me concerned about a bacteria producing urea as a byproduct. Again the possible common organisms will be E. coli, Proteus, and Enterococcus. All 3 of which can be successfully treated with Levaquin. For that reason I think that this may be a good option for [place] to consider placing him on and I did obtain a culture as well for confirmation sake. 03/08/2021 upon evaluation today patient appears to be doing unfortunately still somewhat poorly in regard to his leg ulcerations. He actually has an area on the right leg where he blistered due to the fact that his wrap slid down and caused an area of pinching on his skin and this has led to a significant issue here. 03/15/2021 upon evaluation today patient unfortunately has not been wrapped appropriately with absorptive dressings nor with the appropriate technique for the third layer of the 4-layer compression wrap. These are issues that we continue to try to address with the home health nurse. Also the absorptive dressing that she had was cut in half and therefore that causes things to leak out it does not actually trap the fluid in regard to the top of the foot overall I think that all these combined are really not seeing things improved significantly here. Fortunately there does not appear to be any signs of significant infection at this time which is good news. He still is having a tremendous amount of drainage. 03/22/2021 upon evaluation today  patient appears to be draining tremendously. He still continues to tell me that he is using his pumps 2 times a day and that coupled with that tells me that he is elevating his legs as well. With that being said all things considered I am really just not seeing the improvement we would expect to see with the 4-layer compression wrap and all the above noted. He in fact had an extremely large Zetuvit dressing on both legs and that they were extremely filled to the max with fluid. This is after just being changed just before the weekend and this JHACE, FENNELL ([id number]) is Monday. Nonetheless I am concerned about the fact that there is something going on fairly significant that we cannot get any of this under control and that he is draining this significantly. He supposed be having an echocardiogram it sounds like scheduling has been an issue for him as far as getting in sooner. Its something to do with needing his cousin to drive him because of where it sat and he cannot drive himself to this appointment either way I really think he needs to try to see what he can do about making this happen a little sooner. He tells me he will call today. 03/29/2021 on evaluation today patient appears to be doing about the same in regard to his legs. He did get his cardiology appointment moved up to 6 December which is at least good that is better than what it was before mid December. Overall very pleased in that regard. 04/05/2021 upon evaluation today patient unfortunately is still doing fairly poorly. There does not appear to be any signs of active infection at this time. No fevers, chills, nausea, vomiting, or diarrhea. Unfortunately I think until his edema is under control and overall fluid overload there is really not to  be much chance that I can do much to get him better. This is quite unfortunate and frustrating both for myself and the patient to be perfectly honest. Nonetheless I think that he really needs to  have a conversation both with his primary care provider as well as cardiologist he sees the PCP on Monday and cardiology on Wednesday of next week. 04/13/2021 upon evaluation today patient appears to be doing poorly in regard to his bilateral lower extremities his left is still worse than the right. With that being said he has a tremendous amount of drainage he did see his primary care provider yesterday there really was not much there to be done from their perspective. He sees cardiology tomorrow. Nonetheless my biggest concern here is simply that if we do not get the edema under control he is going to continue to have drainage and honestly I think at some point he is going to become infected severely that is my main concern. 04/19/2021 upon evaluation today patient appears to be doing poorly still in regard to his legs. Unfortunately there does not appear to be any signs of infection at this point. He does have a tremendous amount of drainage however. We have not seen the results back from the cardiologist and the echocardiogram that was done. It appears that the patient checked out okay as far as that is concerned with regard to ejection fraction though we still have some issues here to be honest with his diastolic function. I am unsure if this is accounting for everything that we are seeing or not. Either way he has a tremendous amount of drainage from his legs that we are just not able to control in the outpatient setting at this point. I have reached out to Dr. Rockey Situ his cardiologist to see once he reviews the sheet if there is anything that he feels like can be done from an outpatient perspective if not then I think the way to go is probably can to be through inpatient admission and diuresis. Otherwise I am not sure how working to get this under control we tried antibiotics, compression wrapping, and I have told the patient to be elevating his legs I am not sure how much he does of this but  either way I think that this is still an ongoing issue nonetheless. 04/26/2021 upon evaluation today patient appears to be doing poorly in regard to his legs. He is having a tremendous amount of fluid at this point which is quite unfortunate. Its to the point that he may have had at least 5 to 10 pounds of fluid in his dressings this morning when they were removed these were changed this Friday. Subsequently I think he needs to go to the ER for further evaluation and treatment I think is probably can need diuresis possibly even IV antibiotics been on what the blood work looks like but in general I feel like he needs something to get this under control from an outpatient perspective absent of everywhere I can think of and I cannot get this under control with our traditional measures. I think this is going require more so that we can get him better 12/30; this is a patient with severe bilateral lymphedema. He was hospitalized from 04/26/2021 through 04/29/2021 treated for cellulitis in the setting of lower extremity ulcers and lymphedema. After he left the hospital he is apparently seen for nurse visit our staff contacted cardiology and he has been started on Lasix 40 mg. Apparently his legs  have less edema. Lab work from 05/04/2021 showed a BUN of 38 and creatinine of 1.59 these are elevated versus previous where his creatinine seems to have been 1.30 on 12/19 his potassium is 4.3. I believe the lab work is being followed by cardiology We have him in a 4-layer wrap. Xeroform on the leg wounds and sit to fit on the Berry damage skin on the left dorsal foot versus right dorsal foot. He has compression pumps but does not use them. We have apparently not yet ordered him compression stockings 05/17/2021 upon evaluation today patient's legs though better than last time I personally saw him appear to be getting worse compared to where they were previous. Dr. Quentin Cornwall was actually last 1 to see you I have not  seen him since 19 December. That was before he went into the ER. Coming out apparently his legs looked also and they still look better but not as good as they were in the past. Objective Constitutional Obese and well-hydrated in no acute distress. Vitals Time Taken: 12:39 PM, Height: 73 in, Weight: 312 lbs, BMI: 41.2, Temperature: 97.9 F, Pulse: 76 bpm, Respiratory Rate: 18 breaths/min, Blood Pressure: 146/78 mmHg. Respiratory normal breathing without difficulty. Psychiatric this patient is able to make decisions and demonstrates good insight into disease process. Alert and Oriented x 3. pleasant and cooperative. General Notes: Upon inspection patient's wound bed actually showed signs of significant dry skin and bilateral lymphedema he does have a lot of fluid starting to build up especially on the left foot compared to where he came out of the hospital at. The right foot is still doing better although it ROXIE, GUEYE ([id number]) is also getting worse. Nonetheless I do believe that if were not careful here this is going to continue to trend back towards being worse as it was previous. Integumentary (Hair, Skin) Wound #17 status is Open. Original cause of wound was Blister. The date acquired was: 04/24/2021. The wound has been in treatment 3 weeks. The wound is located on the Left,Anterior Lower Leg. The wound measures 0.5cm length x 0.5cm width x 0.1cm depth; 0.196cm^2 area and 0.02cm^3 volume. There is Fat Layer (Subcutaneous Tissue) exposed. There is no tunneling or undermining noted. There is a large amount of serous drainage noted. There is large (67-100%) red granulation within the wound bed. There is a small (1-33%) amount of necrotic tissue within the wound bed including Adherent Slough. Assessment Active Problems ICD-10 Type 2 diabetes mellitus with other skin ulcer Lymphedema, not elsewhere classified Venous insufficiency (chronic) (peripheral) Non-pressure chronic ulcer of  other part of left lower leg with fat layer exposed Non-pressure chronic ulcer of other part of right foot with fat layer exposed Essential (primary) hypertension Non-pressure chronic ulcer of other part of right lower leg limited to breakdown of skin Procedures Wound #17 Pre-procedure diagnosis of Wound #17 is a Lymphedema located on the Left,Anterior Lower Leg . There was a Four Layer Compression Therapy Procedure by [name], RN. Post procedure Diagnosis Wound #17: Same as Pre-Procedure There was a Four Layer Compression Therapy Procedure by [name], RN. Post procedure Diagnosis Wound #: Same as Pre-Procedure Plan Follow-up Appointments: Return Appointment in 1 week. Nurse Visit as needed Home Health: Ruth: - [place] for wound care. May utilize formulary equivalent dressing for wound treatment orders unless otherwise specified. Home Health Nurse may visit PRN to address patient s wound care needs. - frequency 3 times per week - patient will be  seen once at wound center - home health to see patient 2 times per week Bathing/ Shower/ Hygiene: May shower with wound dressing protected with water repellent cover or cast protector. No tub bath. Edema Control - Lymphedema / Segmental Compressive Device / Other: Optional: One layer of unna paste to top of compression wrap (to act as an anchor). - Unna paste on feet and calf to secure wrap in place 4 Layer Compression System Lymphedema. - bilateral legs bi lat, apply zetuvits/ or formulary for extra absorbant dressing around ankles and foot area, 3 times per week COTTON LAYER SPIRAL, LIGHT TAN SPIRAL. WHITE WITH YELLOW LINE FIGURE 8 , COBAN SPIRAL Elevate, Exercise Daily and Avoid Standing for Long Periods of Time. Elevate legs to the level of the heart and pump ankles as often as possible Elevate leg(s) parallel to the floor when sitting. Compression Pump: Use compression pump on left lower  extremity for 60 minutes, twice daily. - 2 times per day DO YOUR BEST to sleep in the bed at night. DO NOT sleep in your recliner. Long hours of sitting in a recliner leads to swelling of the legs and/or potential wounds on your backside. Other: - Contact prescriber regarding use of diuretics to reduce fluid overload. Off-Loading: Open toe surgical shoe WOUND #17: - Lower Leg Wound Laterality: Left, Anterior Cleanser: Soap and Water 3 x Per Week/30 Days ASIA, FAVATA ([id number]) Discharge Instructions: Gently cleanse wound with antibacterial soap, rinse and pat dry prior to dressing wounds Secondary Dressing: Zetuvit Plus Silicone Non-bordered 5x5 (in/in) 3 x Per Week/30 Days Discharge Instructions: cover feet and any draining areas Compression Wrap: Medichoice 4 layer Compression System, 35-40 mmHG 3 x Per Week/30 Days Discharge Instructions: Apply multi-layer wrap as directed. 1. Would recommend currently that we go ahead and continue with the Zetuvit border foam dressings along with the compression wraps I think this can be the best way to go. 2. I am also can recommend that we have the patient continue with the 4-layer compression wrap which I think is doing a good job. 3. I think doing this pumps 2 times a day will help obviously he can do it even up to 3 times a day and that would not hurt anything. It can definitely help with the edema in his legs. 4. He has an appointment with his primary care provider next week on the 19th that is Thursday. That will be the access/gait to getting to the nephrologist at the [facility]. We will see patient back for reevaluation in 1 week here in the clinic. If anything worsens or changes patient will contact our office for additional recommendations. Electronic Signature(s) Signed: 05/17/2021 1:17:35 PM By: Worthy Keeler PA-C Entered By: Worthy Keeler on 05/17/2021 13:17:34 [name]  ([id number]) -------------------------------------------------------------------------------- SuperBill Details Patient Name: [name] Date of Service: 05/17/2021 Medical Record Number: [id number] Patient Account Number: [id number] Date of Birth/Sex: [birth year] (71 y.o. M) Treating RN: [name] Primary Care Provider: Felipa Eth Other Clinician: Referring Provider: Felipa Eth Treating Provider/Extender: Skipper Cliche in Treatment: 15 Diagnosis Coding ICD-10 Codes Code Description E11.622 Type 2 diabetes mellitus with other skin ulcer I89.0 Lymphedema, not elsewhere classified I87.2 Venous insufficiency (chronic) (peripheral) L97.822 Non-pressure chronic ulcer of other part of left lower leg with fat layer exposed L97.512 Non-pressure chronic ulcer of other part of right foot with fat layer exposed I10 Essential (primary) hypertension L97.811 Non-pressure chronic ulcer of other part of right lower leg limited to breakdown of skin Facility  Procedures CPT4: Description Modifier Quantity Code [id number] BILATERAL: Application of multi-layer venous compression system; leg (below knee), including 1 ankle and foot. Physician Procedures CPT4 Code: 1901222 Description: 41146 - WC PHYS LEVEL 3 - EST PT Modifier: Quantity: 1 CPT4 Code: Description: ICD-10 Diagnosis Description E11.622 Type 2 diabetes mellitus with other skin ulcer I89.0 Lymphedema, not elsewhere classified I87.2 Venous insufficiency (chronic) (peripheral) L97.822 Non-pressure chronic ulcer of other part of left lower  leg with fat layer Modifier: exposed Quantity: Electronic Signature(s) Signed: 05/17/2021 1:43:07 PM By: Worthy Keeler PA-C Entered By: Worthy Keeler on 05/17/2021 13:43:07

## 2021-05-18 NOTE — Progress Notes (Signed)
PARKS, CZAJKOWSKI ([id number]) Visit Report for 05/17/2021 Arrival Information Details Patient Name: [name], Mooney Date of Service: 05/17/2021 12:30 PM Medical Record Number: [id number] Patient Account Number: [id number] Date of Birth/Sex: Oct 19, 1950 (71 y.o. M) Treating RN: [name] Primary Care Ovie Eastep: Felipa Eth Other Clinician: Referring Ramey Ketcherside: Felipa Eth Treating Laquetta Racey/Extender: Skipper Cliche in Treatment: 15 Visit Information History Since Last Visit All ordered tests and consults were completed: No Patient Arrived: [name] Added or deleted any medications: No Arrival Time: 12:35 Any new allergies or adverse reactions: No Accompanied By: self Had a fall or experienced change in No Transfer Assistance: None activities of daily living that may affect Patient Identification Verified: Yes risk of falls: Secondary Verification Process Completed: Yes Signs or symptoms of abuse/neglect since last visito No Patient Requires Transmission-Based No Hospitalized since last visit: No Precautions: Implantable device outside of the clinic excluding No Patient Has Alerts: Yes cellular tissue based products placed in the center Patient Alerts: AVVS consult on file since last visit: Last ABI-R 1.09; L Has Dressing in Place as Prescribed: Yes 1.04 Pain Present Now: No Electronic Signature(s) Signed: 05/18/2021 8:45:44 AM By: [name] RN Entered By: [name] on 05/17/2021 12:39:48 [name] ([id number]) -------------------------------------------------------------------------------- Clinic Level of Care Assessment Details Patient Name: [name] Date of Service: 05/17/2021 12:30 PM Medical Record Number: [id number] Patient Account Number: [id number] Date of Birth/Sex: 1950-09-05 (71 y.o. M) Treating RN: [name] Primary Care [name]: Felipa Eth Other Clinician: Referring [name]: Felipa Eth Treating [name]/Extender: Skipper Cliche  in Treatment: 15 Clinic Level of Care Assessment Items TOOL 4 Quantity Score []  - Use when only an EandM is performed on FOLLOW-UP visit 0 ASSESSMENTS - Nursing Assessment / Reassessment []  - Reassessment of Co-morbidities (includes updates in patient status) 0 []  - 0 Reassessment of Adherence to Treatment Plan ASSESSMENTS - Wound and Skin Assessment / Reassessment []  - Simple Wound Assessment / Reassessment - one wound 0 []  - 0 Complex Wound Assessment / Reassessment - multiple wounds []  - 0 Dermatologic / Skin Assessment (not related to wound area) ASSESSMENTS - Focused Assessment []  - Circumferential Edema Measurements - multi extremities 0 []  - 0 Nutritional Assessment / Counseling / Intervention []  - 0 Lower Extremity Assessment (monofilament, tuning fork, pulses) []  - 0 Peripheral Arterial Disease Assessment (using hand held doppler) ASSESSMENTS - Ostomy and/or Continence Assessment and Care []  - Incontinence Assessment and Management 0 []  - 0 Ostomy Care Assessment and Management (repouching, etc.) PROCESS - Coordination of Care []  - Simple Patient / Family Education for ongoing care 0 []  - 0 Complex (extensive) Patient / Family Education for ongoing care []  - 0 Staff obtains Programmer, systems, Records, Test Results / Process Orders []  - 0 Staff telephones HHA, Nursing Homes / Clarify orders / etc []  - 0 Routine Transfer to another Facility (non-emergent condition) []  - 0 Routine Hospital Admission (non-emergent condition) []  - 0 New Admissions / Biomedical engineer / Ordering NPWT, Apligraf, etc. []  - 0 Emergency Hospital Admission (emergent condition) []  - 0 Simple Discharge Coordination []  - 0 Complex (extensive) Discharge Coordination PROCESS - Special Needs []  - Pediatric / Minor Patient Management 0 []  - 0 Isolation Patient Management []  - 0 Hearing / Language / Visual special needs []  - 0 Assessment of Community assistance (transportation, D/C planning,  etc.) []  - 0 Additional assistance / Altered mentation []  - 0 Support Surface(s) Assessment (bed, cushion, seat, etc.) INTERVENTIONS - Wound Cleansing / Measurement [name] ([id number]) []  - 0 Simple Wound  Cleansing - one wound []  - 0 Complex Wound Cleansing - multiple wounds []  - 0 Wound Imaging (photographs - any number of wounds) []  - 0 Wound Tracing (instead of photographs) []  - 0 Simple Wound Measurement - one wound []  - 0 Complex Wound Measurement - multiple wounds INTERVENTIONS - Wound Dressings []  - Small Wound Dressing one or multiple wounds 0 []  - 0 Medium Wound Dressing one or multiple wounds []  - 0 Large Wound Dressing one or multiple wounds []  - 0 Application of Medications - topical []  - 0 Application of Medications - injection INTERVENTIONS - Miscellaneous []  - External ear exam 0 []  - 0 Specimen Collection (cultures, biopsies, blood, body fluids, etc.) []  - 0 Specimen(s) / Culture(s) sent or taken to Lab for analysis []  - 0 Patient Transfer (multiple staff / Civil Service fast streamer / Similar devices) []  - 0 Simple Staple / Suture removal (25 or less) []  - 0 Complex Staple / Suture removal (26 or more) []  - 0 Hypo / Hyperglycemic Management (close monitor of Blood Glucose) []  - 0 Ankle / Brachial Index (ABI) - do not check if billed separately []  - 0 Vital Signs Has the patient been seen at the hospital within the last three years: Yes Total Score: 0 Level Of Care: ____ Electronic Signature(s) Signed: 05/18/2021 8:45:44 AM By: [name] RN Entered By: [name] on 05/17/2021 13:11:22 [name] ([id number]) -------------------------------------------------------------------------------- Compression Therapy Details Patient Name: [name] Date of Service: 05/17/2021 12:30 PM Medical Record Number: [id number] Patient Account Number: [id number] Date of Birth/Sex: 1950-11-16 (71 y.o. M) Treating RN: [name] Primary Care Eleftheria Taborn: Felipa Eth Other Clinician: Referring Majestic Brister: Felipa Eth Treating Nasra Counce/Extender: Skipper Cliche in Treatment: 15 Compression Therapy Performed for Wound Assessment: Wound #17 Left,Anterior Lower Leg Performed By: Clinician [name], RN Compression Type: Four Layer Post Procedure Diagnosis Same as Pre-procedure Electronic Signature(s) Signed: 05/18/2021 8:45:44 AM By: [name] RN Entered By: [name] on 05/17/2021 13:04:35 [name] ([id number]) -------------------------------------------------------------------------------- Compression Therapy Details Patient Name: [name] Date of Service: 05/17/2021 12:30 PM Medical Record Number: [id number] Patient Account Number: [id number] Date of Birth/Sex: 05-30-1950 (71 y.o. M) Treating RN: [name] Primary Care Mariyanna Mucha: Felipa Eth Other Clinician: Referring Shona Pardo: Felipa Eth Treating Debroah Shuttleworth/Extender: Skipper Cliche in Treatment: 15 Compression Therapy Performed for Wound Assessment: NonWound Condition Lymphedema - Right Leg Performed By: Clinician [name], RN Compression Type: Four Layer Post Procedure Diagnosis Same as Pre-procedure Electronic Signature(s) Signed: 05/18/2021 8:45:44 AM By: [name] RN Entered By: [name] on 05/17/2021 13:05:21 [name] ([id number]) -------------------------------------------------------------------------------- Encounter Discharge Information Details Patient Name: [name] Date of Service: 05/17/2021 12:30 PM Medical Record Number: [id number] Patient Account Number: [id number] Date of Birth/Sex: December 12, 1950 (71 y.o. M) Treating RN: [name] Primary Care Alysson Geist: Felipa Eth Other Clinician: Referring Sathvika Ojo: Felipa Eth Treating Verdell Kincannon/Extender: Skipper Cliche in Treatment: 15 Encounter Discharge Information Items Discharge Condition: Stable Ambulatory Status: Cane Discharge Destination:  Home Transportation: Private Auto Accompanied By: self Schedule Follow-up Appointment: Yes Clinical Summary of Care: Patient Declined Electronic Signature(s) Signed: 05/18/2021 8:45:44 AM By: [name] RN Entered By: [name] on 05/17/2021 13:12:26 [name] ([id number]) -------------------------------------------------------------------------------- Lower Extremity Assessment Details Patient Name: [name] Date of Service: 05/17/2021 12:30 PM Medical Record Number: [id number] Patient Account Number: [id number] Date of Birth/Sex: April 24, 1951 (71 y.o. M) Treating RN: [name] Primary Care Kaleigha Chamberlin: Felipa Eth Other Clinician: Referring Rahmel Nedved: Felipa Eth Treating Atina Feeley/Extender: Skipper Cliche in Treatment: 15 Edema Assessment Assessed: [  Left: No] [Right: No] Edema: [Left: Yes] [Right: Yes] Calf Left: Right: Point of Measurement: 35 cm From Medial Instep 39 cm 39 cm Ankle Left: Right: Point of Measurement: 10 cm From Medial Instep 34 cm 33 cm Electronic Signature(s) Signed: 05/18/2021 8:45:44 AM By: [name] RN Entered By: [name] on 05/17/2021 12:51:51 [name] ([id number]) -------------------------------------------------------------------------------- Multi Wound Chart Details Patient Name: [name] Date of Service: 05/17/2021 12:30 PM Medical Record Number: [id number] Patient Account Number: [id number] Date of Birth/Sex: [birth year] (71 y.o. M) Treating RN: [name] Primary Care Kenidi Elenbaas: Felipa Eth Other Clinician: Referring Saurav Crumble: Felipa Eth Treating Toney Difatta/Extender: Skipper Cliche in Treatment: 15 Vital Signs Height(in): 73 Pulse(bpm): 76 Weight(lbs): 312 Blood Pressure(mmHg): 146/78 Body Mass Index(BMI): 41 Temperature(F): 97.9 Respiratory Rate(breaths/min): 18 Photos: [N/A:N/A] Wound Location: Left, Anterior Lower Leg N/A N/A Wounding Event: Blister N/A N/A Primary Etiology:  Lymphedema N/A N/A Comorbid History: Hypertension, Peripheral Venous N/A N/A Disease, Type II Diabetes, Neuropathy, Received Chemotherapy Date Acquired: 04/24/2021 N/A N/A Weeks of Treatment: 3 N/A N/A Wound Status: Open N/A N/A Measurements L x W x D (cm) 0.5x0.5x0.1 N/A N/A Area (cm) : 0.196 N/A N/A Volume (cm) : 0.02 N/A N/A % Reduction in Area: 99.50% N/A N/A % Reduction in Volume: 99.50% N/A N/A Classification: Full Thickness Without Exposed N/A N/A Support Structures Exudate Amount: Large N/A N/A Exudate Type: Serous N/A N/A Exudate Color: amber N/A N/A Granulation Amount: Large (67-100%) N/A N/A Granulation Quality: Red N/A N/A Necrotic Amount: Small (1-33%) N/A N/A Exposed Structures: Fat Layer (Subcutaneous Tissue): N/A N/A Yes Fascia: No Tendon: No Muscle: No Joint: No Bone: No Epithelialization: None N/A N/A Treatment Notes Electronic Signature(s) Signed: 05/18/2021 8:45:44 AM By: [name] RN Entered By: [name] on 05/17/2021 13:04:05 [name] ([id number]) -------------------------------------------------------------------------------- [place] Details Patient Name: [name] Date of Service: 05/17/2021 12:30 PM Medical Record Number: [id number] Patient Account Number: [id number] Date of Birth/Sex: 1950/10/05 (71 y.o. M) Treating RN: [name] Primary Care Abdur Hoglund: Felipa Eth Other Clinician: Referring Verbena Boeding: Felipa Eth Treating Sinai Mahany/Extender: Skipper Cliche in Treatment: 15 Active Inactive Electronic Signature(s) Signed: 05/18/2021 8:45:44 AM By: [name] RN Entered By: [name] on 05/17/2021 13:03:47 [name] ([id number]) -------------------------------------------------------------------------------- Pain Assessment Details Patient Name: [name] Date of Service: 05/17/2021 12:30 PM Medical Record Number: [id number] Patient Account Number: [id number] Date of Birth/Sex:  [birth year] (71 y.o. M) Treating RN: [name] Primary Care Kaliana Albino: Felipa Eth Other Clinician: Referring Anuel Sitter: Felipa Eth Treating Laya Letendre/Extender: Skipper Cliche in Treatment: 15 Active Problems Location of Pain Severity and Description of Pain Patient Has Paino No Site Locations Pain Management and Medication Current Pain Management: Electronic Signature(s) Signed: 05/18/2021 8:45:44 AM By: [name] RN Entered By: [name] on 05/17/2021 12:40:39 [name] ([id number]) -------------------------------------------------------------------------------- Patient/Caregiver Education Details Patient Name: [name] Date of Service: 05/17/2021 12:30 PM Medical Record Number: [id number] Patient Account Number: [id number] Date of Birth/Gender: 1950/11/24 (71 y.o. M) Treating RN: [name] Primary Care Physician: Felipa Eth Other Clinician: Referring Physician: Felipa Eth Treating Physician/Extender: Skipper Cliche in Treatment: 15 Education Assessment Education Provided To: Patient Education Topics Provided Wound/Skin Impairment: Methods: Explain/Verbal Responses: State content correctly Electronic Signature(s) Signed: 05/18/2021 8:45:44 AM By: [name] RN Entered By: [name] on 05/17/2021 13:11:46 [name] ([id number]) -------------------------------------------------------------------------------- Wound Assessment Details Patient Name: [name] Date of Service: 05/17/2021 12:30 PM Medical Record Number: [id number] Patient Account Number: [id number] Date of Birth/Sex: 1950-09-26 (71 y.o. M) Treating RN: [name] Primary Care  Bryon Parker: Felipa Eth Other Clinician: Referring Keyshun Elpers: Felipa Eth Treating Aiyden Lauderback/Extender: Jeri Cos Weeks in Treatment: 15 Wound Status Wound Number: 17 Primary Lymphedema Etiology: Wound Location: Left, Anterior Lower Leg Wound Open Wounding Event:  Blister Status: Date Acquired: 04/24/2021 Comorbid Hypertension, Peripheral Venous Disease, Type II Weeks Of Treatment: 3 History: Diabetes, Neuropathy, Received Chemotherapy Clustered Wound: No Photos Wound Measurements Length: (cm) 0.5 Width: (cm) 0.5 Depth: (cm) 0.1 Area: (cm) 0.196 Volume: (cm) 0.02 % Reduction in Area: 99.5% % Reduction in Volume: 99.5% Epithelialization: None Tunneling: No Undermining: No Wound Description Classification: Full Thickness Without Exposed Support Structures Exudate Amount: Large Exudate Type: Serous Exudate Color: amber Foul Odor After Cleansing: No Slough/Fibrino Yes Wound Bed Granulation Amount: Large (67-100%) Exposed Structure Granulation Quality: Red Fascia Exposed: No Necrotic Amount: Small (1-33%) Fat Layer (Subcutaneous Tissue) Exposed: Yes Necrotic Quality: Adherent Slough Tendon Exposed: No Muscle Exposed: No Joint Exposed: No Bone Exposed: No Treatment Notes Wound #17 (Lower Leg) Wound Laterality: Left, Anterior Cleanser Soap and Water Discharge Instruction: Gently cleanse wound with antibacterial soap, rinse and pat dry prior to dressing wounds Peri-Wound Care Camino, [name] ([id number]) Topical Primary Dressing Secondary Dressing Zetuvit Plus Silicone Non-bordered 5x5 (in/in) Discharge Instruction: cover feet and any draining areas Secured With Compression Wrap Medichoice 4 layer Compression System, 35-40 mmHG Discharge Instruction: Apply multi-layer wrap as directed. Compression Stockings Add-Ons Electronic Signature(s) Signed: 05/18/2021 8:45:44 AM By: [name] RN Entered By: [name] on 05/17/2021 12:50:16 [name] ([id number]) -------------------------------------------------------------------------------- Vitals Details Patient Name: [name] Date of Service: 05/17/2021 12:30 PM Medical Record Number: [id number] Patient Account Number: [id number] Date of Birth/Sex: 1950-09-01 (71 y.o.  M) Treating RN: [name] Primary Care Jodilyn Giese: Felipa Eth Other Clinician: Referring Peni Rupard: Felipa Eth Treating Maverik Foot/Extender: Skipper Cliche in Treatment: 15 Vital Signs Time Taken: 12:39 Temperature (F): 97.9 Height (in): 73 Pulse (bpm): 76 Weight (lbs): 312 Respiratory Rate (breaths/min): 18 Body Mass Index (BMI): 41.2 Blood Pressure (mmHg): 146/78 Reference Range: 80 - 120 mg / dl Electronic Signature(s) Signed: 05/18/2021 8:45:44 AM By: [name] RN Entered By: [name] on 05/17/2021 12:40:28

## 2021-05-24 ENCOUNTER — Encounter: Payer: PPO | Admitting: Internal Medicine

## 2021-05-24 ENCOUNTER — Other Ambulatory Visit: Payer: Self-pay

## 2021-05-24 DIAGNOSIS — E11622 Type 2 diabetes mellitus with other skin ulcer: Secondary | ICD-10-CM | POA: Diagnosis not present

## 2021-05-25 ENCOUNTER — Ambulatory Visit: Payer: No Typology Code available for payment source | Admitting: Physician Assistant

## 2021-05-25 NOTE — Progress Notes (Signed)
VINCENT, STREATER ([id number]) Visit Report for 05/24/2021 HPI Details Patient Name: [name], Mooney Date of Service: 05/24/2021 3:30 PM Medical Record Number: [id number] Patient Account Number: [id number] Date of Birth/Sex: Nov 27, 1950 (71 y.o. M) Treating RN: [name] Primary Care Provider: Felipa Eth Other Clinician: Referring Provider: Felipa Eth Treating Provider/Extender: Tito Dine in Treatment: 16 History of Present Illness HPI Description: 71 year old male who presented to the ER with bilateral lower extremity blisters which had started last week. he has a past medical history of leukemia, diabetes mellitus, hypertension, edema of both lower extremities, his recurrent skin infections, peripheral vascular disease, coronary artery disease, congestive heart failure and peripheral neuropathy. in the ER he was given Rocephin and put on Silvadene cream. he was put on oral doxycycline and was asked to follow-up with the [facility]. His last hemoglobin A1c was 6.6 in December and he checks his blood sugar once a week. He does not have any physicians outside the [place] system. He does not recall any vascular duplex studies done either for arterial or venous disease but was told to wear compression stockings which he does not use 05/30/2016 -- we have not yet received any of his notes from the [facility] hospital system and his arterial and venous duplex studies are scheduled here in [place] around mid February. We are unable to have his insurance accepted by home health agencies and hence he is getting dressings only once a week. 06/06/16 -- -- I received a call from the patient's PCP at the [facility] and spoke to Dr. Garvin Fila, phone number [phone] and fax number [phone]. She confirmed that no vascular testing was done over the last 5 years and she would be happy to do them if the patient did want them to be done at the [place] and we could fax him a  request. Readmission: 71 year old male seen by as in February of this year and was referred to vein and vascular for studies and opinion from the vascular surgeons. The patient returns today with a fresh problem having had blisters on his left lower extremity which have been there for about 5 days and he clearly states that he has been wearing his compression stockings as advised though he could not read the moderate compression and has been wearing light compression. Review of his electronic medical records note that he had lower extremity arterial duplex examination done on 06/23/2016 which showed no hemodynamically significant stenosis in the bilateral lower extremity arterial system. He also had a lower extremity venous reflux examination done on 07/07/2016 and it was noted that he had venous incompetence in the right great saphenous vein and bilateral common femoral veins. Patient was seen by Dr. Tamala Julian on the same day and for some reason his notes do not reflect the venous studies or the arterial studies and he recommended patient do a venous duplex ultrasound to look for reflux and return to see him.he would also consider a lymph pump if required. The patient was told that his workup was normal and hence the patient canceled his follow-up appointment. 02/03/17 on evaluation today patient left medial lower extremity blister appears to be doing about the same. It is still continuing to drain and there's still the blistered skin covering the wound bed which is making it difficult for the alternate to do its job. Fortunately there is no evidence of cellulitis. No fevers chills noted. Patient states in general he is not having any significant discomfort. Patient's lower extremity arterial duplex exam  revealed that patient was hemodynamically stable with no evidence of stenosis in regard to the bilateral lower extremities. The lower extremity venous reflux exam revealed the patient had venous  incontinence noted in the right greater saphenous and bilateral common femoral vein. There is no evidence of deep or superficial vein thrombosis in the bilateral lower extremities. Readmission: 11/12/18 Patient presents for evaluation our clinic today concerning issues that he is having with his left lower extremity. He tells me that a couple weeks ago he began developing blisters on the left lower extremity along with increased swelling. He typically wears his compression stockings on a regular basis is previously been evaluated both here as well is with vascular surgery they would recommend lymphedema pumps but unfortunately that somehow fell through and he never heard anything back from that. Nonetheless I think lymphedema pumps would be beneficial for this patient. He does have a history of hypertension and diabetes. Obviously the chronic venous stasis and lymphedema as well. At this point the blisters have been given in more trouble he states sometimes when the blisters openings able to clean it down with alcohol and it will dry out and do well. Unfortunately that has not been the case this time. He is having some discomfort although this mean these with cleaning the areas he doesn't have discomfort just on a regular basis. He has not been able to wear his compression stockings since the blisters arose due to the fact that of course it will drain into the socks causing additional issues and he didn't have any way to wrap this otherwise. He has increased to taking his Lasix every day instead of every other day. He sees his primary care provider later this month as well. No fevers, chills, nausea, or vomiting noted at this time. 11/19/18-Patient returns at 1 week, per intake RN the amount of seepage into the compression wraps was definitely improved, overall all the wounds are measuring smaller but continuing silver alginate to the wounds as primary dressing 11/26/18 on evaluation today patient  appears to be doing quite well in regard to his left lower [place] ulcers. In fact of the areas that were noted initially he only has two regions still open. There is no evidence of active infection at this time. He still is not heard anything from the company regarding lymphedema pumps as of yet. Again as previously seen vascular they have not recommended any surgical intervention. 12/03/2018 on evaluation today patient actually appears to be doing quite well with regard to his lower extremity ulcers. In fact most of the areas [place], [place] ([id number]) appear to be healed the one spot which does not seem to be completely healed I am unsure of whether or not this is really draining that much but nonetheless there does not appear to be any signs of infection or significant drainage at this point. There is no sign of fever, chills, nausea, vomiting, or diarrhea. Overall I am pleased with how things have progressed I think is very close to being able to transition to his home compression stockings. 12/10/2018 upon evaluation today patient appears to be doing quite well with regard to his left lower extremity. He has been tolerating the dressing changes without complication. Fortunately there is no signs of active infection at this time. He appears after thorough evaluation of his leg to only have 1 small area that remains open at this point everything else appears to be almost completely closed. He still have significant swelling of the left lower  extremity. We had discussed discussing this with his primary care provider he is not able to see her in person they were at the [facility] and right now the [place] is not seeing patients on site. According to the patient anyway. Subsequently he did speak with her apparently and his primary care provider feels that he may likely have a DVT. With that being said she has not seen his leg she is just going off of his history. Nonetheless that is a concern that the  patient now has as well and while I do not feel the DVT is likely we can definitely ensure that that is not the case I will go ahead and see about putting that order in today. Nonetheless otherwise I am in a recommend that we continue with the current wound care measures including the compression therapy most likely. We just need to ensure that his leg is indeed free of any DVTs. 12/17/2018 on evaluation today patient actually appears to be completely healed today. He does have 2 very small areas of blistering although this is not anything too significant at this point which is good news. With that being said I am in agreement with the fact that I think he is completely healed at this point. He does want to get back into his compression stocking. The good news is we have gotten approval from insurance for his lymphedema pumps we received a letter since last saw him last week. The other good news is his study did come back and showed no evidence of a DVT. 12/20/2018 on evaluation today patient presents for follow-up concerning his ongoing issues with his left lower extremity. He was actually discharged last Friday and did fairly well until he states blisters opened this morning. He tells me he has been wearing his compression stocking although he has a hard time getting this on. There does not appear to be any signs of active infection at this time. No fevers, chills, nausea, vomiting, or diarrhea. 12/27/2018 on evaluation today patient appears to be doing very well with regard to his swelling of the left lower extremity the 4 layer compression wrap seems to have been beneficial for him. Fortunately there is no signs of active infection at this time. Patient has been tolerating the compression wrap without complication and his foot swelling in particular appears to be greatly improved. He does still have a wound on the lateral portion of his left leg I believe this is more of a blister that has now  reopened. 01/03/2019 on evaluation today patient actually appears to be doing excellent in regard to his left lower extremity. He did receive his compression pumps and is actually use this 7 times since he was last here in the office. On top of the compression wrap he is now roughly 3 cm better at the calf and 2 cm better at the ankle he also states that his foot seem to go an issue better without even having to use a shoe horn. Obviously I think this is all evidence that he is doing excellent in this regard. The other good news is he does not appear to have anything open today as far as wounds are concerned. 01/15/2019 on evaluation today patient appears to be doing more poorly yet again with regard to his left lower extremity. He has developed new wounds again after being discharged just recently. Unfortunately this continues to be the case that he will heal and then have subsequent new wounds. The last  time I was hopeful that he may not end up coming back too quickly especially since he states he has been using his lymphedema pumps along with wearing his compression. Nonetheless he had a blister on the back of his leg that popped up on the left and this has opened up into an ulceration it is quite painful. 01/22/19 on evaluation today patient actually appears to be doing well with regard to his wound on the left lower extremity. He's been tolerating the dressing changes without complication including the compression wrap in the wound appears to be significantly smaller today which is great news. Overall very pleased in this regard. 01/29/2019 on evaluation today patient appears to be doing well with regard to his left posterior lower extremity ulcer. He has been tolerating the dressing changes without complication. This is not completely healed but is getting much closer. We did order a Farrow wrap 4000 for him he has received this and has it with him today although I am not sure we are quite ready to  start him on that as of yet. We are very close. 02/05/2019 on evaluation today patient actually appears to be doing quite well with regard to his left posterior lower extremity ulcer. He still has a very tiny opening remaining but the fortunate thing is he seems to be healing quite nicely. He also did get his Farrow wrap which I am hoping will help with his edema control as well at home. Fortunately there is no evidence of active infection. 02/12/2019 patient and fortunately appears to be doing poorly in regard to his wounds of the left lower extremity. He was very close to healing therefore we attempted to use his Velcro compression wraps continuing with lymphedema pumps at home. Unfortunately that does not seem to have done very well for him. He tells me that he wore them all the time but again I am not sure why if that is the case that he is having such significant edema. He is still on his fluid pills as well. With that being said there is no obvious sign of infection although I do wonder about the possibility of infection at this time as well. 02/19/2019 unfortunately upon evaluation today patient appears to be doing more poorly with regard to his left lower extremity. He is not showing signs of significant improvement and I think the biggest issue here is that he does have an infection that appears to likely be Pseudomonas. That is based on the blue-green drainage that were noted at this time. Unfortunately the antibiotic that has been on is not going to take care of this at all. I think they will get a need to switch him to either Levaquin or Cipro and this was discussed with the patient. 02/26/2019 on evaluation today patient's lower extremity on the left appears to be doing significantly better as compared to last evaluation. Fortunately there is no signs of active infection at this time. He has been tolerating the compression wrap without complication in fact he made it the whole week at this  point. He is showing signs of excellent improvement I am very happy in this regard. With that being said he is having some issues with infection we did review the results of his culture which I noted today. He did have a positive finding for Enterobacter as well as Alcaligenes faecalis. Fortunately the Levaquin that I placed him on will work for both which is great news. There is no signs of systemic infection  at this point. 10/30; left posterior leg wound in the setting of very significant edema and what looks like chronic venous inflammation. He has compression pumps but does not use them. We have been using 3 layer compression. Silver alginate to the wound as the primary dressing 03/18/2019 on evaluation today patient appears to be doing a little better compared to last time I saw him. He really has not been using his compression pumps he tells me that he is having too much discomfort. He has been keeping his wraps on however. He is only been taking his fluid pills every other day because he states they are not really helping and he has an appointment with his primary care provider at the [facility] tomorrow. Subsequently the wound itself on the left lower extremity does seem to be greatly improved compared to previous. ALMALIK, WEISSBERG ([id number]) 03/25/2019 on evaluation today patient appears to be doing better with regard to his wounds on the bilateral lower extremities. The left is doing excellent the right is also doing better although both still do show some signs of open wounds noted at this point unfortunately. Fortunately there is no signs of active infection at this time. The patient also is not really having any significant pain which is good news. Unfortunately there was some confusion with the referral on vascular disease and as far as getting the patient scheduled there can be contacting him later today to do this fortunately we got this straightened out. 04/01/2019 on evaluation today patient  appears to be doing no fevers, chills, nausea, vomiting, or diarrhea. Excellent at this time with regard to his lower extremities. There does not appear to be any open wound at this point which is good news. Fortunately is also no signs of active infection at this time. Overall feel like the patient has done excellent with the compression the problem is every time we got him to this point and then subsequently go to using his own compression things just go right back to where they were. I am not sure how to address this we can try to get an appointment with vascular for 2 weeks now they have yet to call him. Obviously this has become frustrating for the patient as well. I think the issue has just been an honest error as far as scheduling is concerned but nonetheless still worn out the point where I am unsure of which direction we should take. 04/08/2019 on evaluation today patient actually appears to be doing well with regard to his lower extremities. There are no open wounds at this time and things seem to be managing quite nicely as far as the overall edema control is concerned. With that being said he does have his compression socks today for [place] to go ahead and reinitiate therapy in that manner at this point. He is going to be going for shoes to be measured on Wednesday and then coincidentally he will also be seeing vascular on Thursday. Overall I think this is good news and again I am hopeful that they will be able to do something for him to help prevent ongoing issues with edema control as well. No fevers, chills, nausea, vomiting, or diarrhea. 04/11/2019 on evaluation today patient actually appears to be doing poorly after just being discharged on Monday of this week. He had been experiencing issues with again blisters especially on the left lower extremity. With that being said he was completely healed and appeared to be doing great this past Monday. He then  subsequently has new blisters that  formed before his appointment with vascular this morning. He was also measured for shoes in the interim. With that being said we may have figured out what exactly is going on and why he continues to have issues like what we are seeing at this point. He takes his compression stockings off at nighttime and then he ends up having to sleep in his chair for 5-6 hours a night. He sleeps with his feet down he cannot really get him up in the recliner and therefore he is sleeping and the worst possible his position with his feet on the floor for that majority of the time. Again as I explained to him that is about one third at minimum at least one fourth of his day that he spending with his feet dangling down on the ground and the worst possible position they could be. I think this may be what is causing the issue. Subsequently I am leaning toward thinking that he may need a hospital bed in order to elevate his legs. We likely can have to coordinate this with his primary care provider at the [facility]. Readmission: 01/26/2021 this is a patient who presents for repeat evaluation here in the clinic although it is actually been couple of years since have seen him in fact it was December 2020 when I last saw him. Subsequently he never really healed but did end up being lost to follow-up. He tells me has been having issues ongoing with his lower extremities has bilateral lower extremity lymphedema no real significant or definitive open wounds but in general his lymphedema is way out of control. We were never able to refer him to lymphedema clinic simply due to the fact to be honest we were never able to get him completely healed. I do not see anyone with open wounds. The patient does have evidence of type 2 diabetes mellitus, lymphedema, chronic venous insufficiency, and hypertension. That really has not changed since his last evaluation. 02/09/2021 upon evaluation today patient appears to be doing a little better  in regard to his legs although he still having a tremendous amount of drainage especially on the left leg. Fortunately there does not appear to be any evidence of active infection. Of note when we looked into this further it appears that the patient did not have any absorptive dressing on it was just the 4-layer compression wrap. Nonetheless this is probably big part of the issue here. 10/10; he comes in today with 3 large areas on the upper right lower leg likely remanence of denuded blistering under his compression wraps. He has no other wounds on the right. On the left he has the denuded area on the left medial foot and ankle and on the left dorsal foot. Massive lymphedema in both feet dorsally. Using Zetuvit under compression We have increased home health visitation to twice a week to change the dressings and will change it once 02/22/2021 upon evaluation today patient appears to be doing well currently with regard to his wounds. He has been tolerating the dressing changes without complication. Fortunately there does not appear to be any evidence of active infection which is great news. No fevers, chills, nausea, vomiting, or diarrhea. The biggest issue I see currently is that home health is not putting any medicine on the actual wounds before wrapping. 03/01/2021 upon evaluation today the patient's right leg actually appears to be doing quite well which is great news there does not appear to be  any evidence of active infection at this time. No fevers, chills, nausea, vomiting, or diarrhea. With that being said the patient is having issues on the left foot where he is having significant drainage is also an ammonia smell he does not have any animals at home and this makes me concerned about a bacteria producing urea as a byproduct. Again the possible common organisms will be E. coli, Proteus, and Enterococcus. All 3 of which can be successfully treated with Levaquin. For that reason I think that  this may be a good option for [place] to consider placing him on and I did obtain a culture as well for confirmation sake. 03/08/2021 upon evaluation today patient appears to be doing unfortunately still somewhat poorly in regard to his leg ulcerations. He actually has an area on the right leg where he blistered due to the fact that his wrap slid down and caused an area of pinching on his skin and this has led to a significant issue here. 03/15/2021 upon evaluation today patient unfortunately has not been wrapped appropriately with absorptive dressings nor with the appropriate technique for the third layer of the 4-layer compression wrap. These are issues that we continue to try to address with the home health nurse. Also the absorptive dressing that she had was cut in half and therefore that causes things to leak out it does not actually trap the fluid in regard to the top of the foot overall I think that all these combined are really not seeing things improved significantly here. Fortunately there does not appear to be any signs of significant infection at this time which is good news. He still is having a tremendous amount of drainage. 03/22/2021 upon evaluation today patient appears to be draining tremendously. He still continues to tell me that he is using his pumps 2 times a day and that coupled with that tells me that he is elevating his legs as well. With that being said all things considered I am really just not seeing the improvement we would expect to see with the 4-layer compression wrap and all the above noted. He in fact had an extremely large Zetuvit dressing on both legs and that they were extremely filled to the max with fluid. This is after just being changed just before the weekend and this is Monday. Nonetheless I am concerned about the fact that there is something going on fairly significant that we cannot get any of this under Aten, [name] ([id number]) control and that he is draining  this significantly. He supposed be having an echocardiogram it sounds like scheduling has been an issue for him as far as getting in sooner. Its something to do with needing his cousin to drive him because of where it sat and he cannot drive himself to this appointment either way I really think he needs to try to see what he can do about making this happen a little sooner. He tells me he will call today. 03/29/2021 on evaluation today patient appears to be doing about the same in regard to his legs. He did get his cardiology appointment moved up to 6 December which is at least good that is better than what it was before mid December. Overall very pleased in that regard. 04/05/2021 upon evaluation today patient unfortunately is still doing fairly poorly. There does not appear to be any signs of active infection at this time. No fevers, chills, nausea, vomiting, or diarrhea. Unfortunately I think until his edema is under control and  overall fluid overload there is really not to be much chance that I can do much to get him better. This is quite unfortunate and frustrating both for myself and the patient to be perfectly honest. Nonetheless I think that he really needs to have a conversation both with his primary care provider as well as cardiologist he sees the PCP on Monday and cardiology on Wednesday of next week. 04/13/2021 upon evaluation today patient appears to be doing poorly in regard to his bilateral lower extremities his left is still worse than the right. With that being said he has a tremendous amount of drainage he did see his primary care provider yesterday there really was not much there to be done from their perspective. He sees cardiology tomorrow. Nonetheless my biggest concern here is simply that if we do not get the edema under control he is going to continue to have drainage and honestly I think at some point he is going to become infected severely that is my main concern. 04/19/2021  upon evaluation today patient appears to be doing poorly still in regard to his legs. Unfortunately there does not appear to be any signs of infection at this point. He does have a tremendous amount of drainage however. We have not seen the results back from the cardiologist and the echocardiogram that was done. It appears that the patient checked out okay as far as that is concerned with regard to ejection fraction though we still have some issues here to be honest with his diastolic function. I am unsure if this is accounting for everything that we are seeing or not. Either way he has a tremendous amount of drainage from his legs that we are just not able to control in the outpatient setting at this point. I have reached out to Dr. Rockey Situ his cardiologist to see once he reviews the sheet if there is anything that he feels like can be done from an outpatient perspective if not then I think the way to go is probably can to be through inpatient admission and diuresis. Otherwise I am not sure how working to get this under control we tried antibiotics, compression wrapping, and I have told the patient to be elevating his legs I am not sure how much he does of this but either way I think that this is still an ongoing issue nonetheless. 04/26/2021 upon evaluation today patient appears to be doing poorly in regard to his legs. He is having a tremendous amount of fluid at this point which is quite unfortunate. Its to the point that he may have had at least 5 to 10 pounds of fluid in his dressings this morning when they were removed these were changed this Friday. Subsequently I think he needs to go to the ER for further evaluation and treatment I think is probably can need diuresis possibly even IV antibiotics been on what the blood work looks like but in general I feel like he needs something to get this under control from an outpatient perspective absent of everywhere I can think of and I cannot get this  under control with our traditional measures. I think this is going require more so that we can get him better 12/30; this is a patient with severe bilateral lymphedema. He was hospitalized from 04/26/2021 through 04/29/2021 treated for cellulitis in the setting of lower extremity ulcers and lymphedema. After he left the hospital he is apparently seen for nurse visit our staff contacted cardiology and he has been  started on Lasix 40 mg. Apparently his legs have less edema. Lab work from 05/04/2021 showed a BUN of 38 and creatinine of 1.59 these are elevated versus previous where his creatinine seems to have been 1.30 on 12/19 his potassium is 4.3. I believe the lab work is being followed by cardiology We have him in a 4-layer wrap. Xeroform on the leg wounds and sit to fit on the Berry damage skin on the left dorsal foot versus right dorsal foot. He has compression pumps but does not use them. We have apparently not yet ordered him compression stockings 05/17/2021 upon evaluation today patient's legs though better than last time I personally saw him appear to be getting worse compared to where they were previous. Dr. Quentin Cornwall was actually last 1 to see you I have not seen him since 19 December. That was before he went into the ER. Coming out apparently his legs looked also and they still look better but not as good as they were in the past. 1/16; patient with severe bilateral lymphedema. Severe scaled hyperkeratotic skin on the dorsal aspect of his distal left foot and left medial ankle.. On the right side changes are not as bad. He did not have any weeping edema. Our intake nurse was convinced that he is being compliant with compression pumps 1 hour twice a day Electronic Signature(s) Signed: 05/25/2021 7:38:21 AM By: Linton Ham MD Entered By: Linton Ham on 05/24/2021 16:19:35 [name]  ([id number]) -------------------------------------------------------------------------------- Physical Exam Details Patient Name: [name] Date of Service: 05/24/2021 3:30 PM Medical Record Number: [id number] Patient Account Number: [id number] Date of Birth/Sex: [birth year] (71 y.o. M) Treating RN: [name] Primary Care Provider: Felipa Eth Other Clinician: Referring Provider: Felipa Eth Treating Provider/Extender: Tito Dine in Treatment: 70 Constitutional Patient is hypertensive.. Pulse regular and within target range for patient.Marland Kitchen Respirations regular, non-labored and within target range.. Temperature is normal and within the target range for the patient.Marland Kitchen appears in no distress. Notes Wound exam; minimal weeping areas on the left foot. I did not see anything on the right. We have been placing sit to fit on weeping areas and putting him in 4-layer compression. He is being compliant with his compression pumps per our intake nurse. No evidence of infection Electronic Signature(s) Signed: 05/25/2021 7:38:21 AM By: Linton Ham MD Entered By: Linton Ham on 05/24/2021 16:20:16 [name] ([id number]) -------------------------------------------------------------------------------- Physician Orders Details Patient Name: [name] Date of Service: 05/24/2021 3:30 PM Medical Record Number: [id number] Patient Account Number: [id number] Date of Birth/Sex: 03-27-1951 (71 y.o. M) Treating RN: [name] Primary Care Provider: Felipa Eth Other Clinician: Referring Provider: Felipa Eth Treating Provider/Extender: Tito Dine in Treatment: 59 Verbal / Phone Orders: No Diagnosis Coding Follow-up Appointments o Return Appointment in 1 week. o Nurse Visit as needed [place] for wound care. May utilize formulary equivalent dressing for wound treatment orders  unless otherwise specified. Home Health Nurse may visit PRN to address patientos wound care needs. - frequency 3 times per week - patient will be seen once at wound center - home health to see patient 2 times per week Bathing/ Shower/ Hygiene o May shower with wound dressing protected with water repellent cover or cast protector. o No tub bath. Edema Control - Lymphedema / Segmental Compressive Device / Other Bilateral Lower Extremities o Optional: One layer of unna paste to top of compression wrap (to act as an anchor). Louretta Parma  paste on feet and calf to secure wrap in place o 4 Layer Compression System Lymphedema. - bilateral legs bi lat, apply zetuvits/ or formulary for extra absorbant dressing around ankles and foot area, 3 times per week COTTON LAYER SPIRAL, LIGHT TAN SPIRAL. WHITE WITH YELLOW LINE FIGURE 8 , COBAN SPIRAL o Elevate, Exercise Daily and Avoid Standing for Long Periods of Time. o Elevate legs to the level of the heart and pump ankles as often as possible o Elevate leg(s) parallel to the floor when sitting. o Compression Pump: Use compression pump on left lower extremity for 60 minutes, twice daily. - 2 times per day o DO YOUR BEST to sleep in the bed at night. DO NOT sleep in your recliner. Long hours of sitting in a recliner leads to swelling of the legs and/or potential wounds on your backside. o Other: - Contact prescriber regarding use of diuretics to reduce fluid overload. Off-Loading Bilateral Lower Extremities o Open toe surgical shoe Wound Treatment Wound #17 - Lower Leg Wound Laterality: Left, Anterior Cleanser: Soap and Water 3 x Per Week/30 Days Discharge Instructions: Gently cleanse wound with antibacterial soap, rinse and pat dry prior to dressing wounds Secondary Dressing: Zetuvit Plus Silicone Non-bordered 5x5 (in/in) 3 x Per Week/30 Days Discharge Instructions: cover feet and any draining areas Compression Wrap: Medichoice 4 layer  Compression System, 35-40 mmHG 3 x Per Week/30 Days Discharge Instructions: Apply multi-layer wrap as directed. Electronic Signature(s) Signed: 05/25/2021 7:38:21 AM By: Linton Ham MD Signed: 05/25/2021 7:52:30 AM By: [name] RN Entered By: [name] on 05/24/2021 16:02:26 [name] ([id number]) -------------------------------------------------------------------------------- Problem List Details Patient Name: [name] Date of Service: 05/24/2021 3:30 PM Medical Record Number: [id number] Patient Account Number: [id number] Date of Birth/Sex: Aug 12, 1950 (71 y.o. M) Treating RN: [name] Primary Care Provider: Felipa Eth Other Clinician: Referring Provider: Felipa Eth Treating Provider/Extender: Tito Dine in Treatment: 16 Active Problems ICD-10 Encounter Code Description Active Date MDM Diagnosis E11.622 Type 2 diabetes mellitus with other skin ulcer 01/26/2021 No Yes I89.0 Lymphedema, not elsewhere classified 01/26/2021 No Yes I87.2 Venous insufficiency (chronic) (peripheral) 01/26/2021 No Yes L97.822 Non-pressure chronic ulcer of other part of left lower leg with fat layer 01/26/2021 No Yes exposed L97.512 Non-pressure chronic ulcer of other part of right foot with fat layer 01/26/2021 No Yes exposed [place] (primary) hypertension 01/26/2021 No Yes L97.811 Non-pressure chronic ulcer of other part of right lower leg limited to 02/15/2021 No Yes breakdown of skin Inactive Problems Resolved Problems Electronic Signature(s) Signed: 05/25/2021 7:38:21 AM By: Linton Ham MD Entered By: Linton Ham on 05/24/2021 16:17:21 [name] ([id number]) -------------------------------------------------------------------------------- Progress Note Details Patient Name: [name] Date of Service: 05/24/2021 3:30 PM Medical Record Number: [id number] Patient Account Number: [id number] Date of Birth/Sex: 1951/02/13 (71 y.o. M) Treating RN:  [name] Primary Care Provider: Felipa Eth Other Clinician: Referring Provider: Felipa Eth Treating Provider/Extender: Tito Dine in Treatment: 16 Subjective History of Present Illness (HPI) 71 year old male who presented to the ER with bilateral lower extremity blisters which had started last week. he has a past medical history of leukemia, diabetes mellitus, hypertension, edema of both lower extremities, his recurrent skin infections, peripheral vascular disease, coronary artery disease, congestive heart failure and peripheral neuropathy. in the ER he was given Rocephin and put on Silvadene cream. he was put on oral doxycycline and was asked to follow-up with the [facility]. His last hemoglobin A1c was 6.6 in December and he checks his blood  sugar once a week. He does not have any physicians outside the [place] system. He does not recall any vascular duplex studies done either for arterial or venous disease but was told to wear compression stockings which he does not use 05/30/2016 -- we have not yet received any of his notes from the [facility] hospital system and his arterial and venous duplex studies are scheduled here in [place] around mid February. We are unable to have his insurance accepted by home health agencies and hence he is getting dressings only once a week. 06/06/16 -- -- I received a call from the patient's PCP at the [facility] and spoke to Dr. Garvin Fila, phone number [phone] and fax number [phone]. She confirmed that no vascular testing was done over the last 5 years and she would be happy to do them if the patient did want them to be done at the [place] and we could fax him a request. Readmission: 71 year old male seen by as in February of this year and was referred to vein and vascular for studies and opinion from the vascular surgeons. The patient returns today with a fresh problem having had blisters on his left lower extremity which have  been there for about 5 days and he clearly states that he has been wearing his compression stockings as advised though he could not read the moderate compression and has been wearing light compression. Review of his electronic medical records note that he had lower extremity arterial duplex examination done on 06/23/2016 which showed no hemodynamically significant stenosis in the bilateral lower extremity arterial system. He also had a lower extremity venous reflux examination done on 07/07/2016 and it was noted that he had venous incompetence in the right great saphenous vein and bilateral common femoral veins. Patient was seen by Dr. Tamala Julian on the same day and for some reason his notes do not reflect the venous studies or the arterial studies and he recommended patient do a venous duplex ultrasound to look for reflux and return to see him.he would also consider a lymph pump if required. The patient was told that his workup was normal and hence the patient canceled his follow-up appointment. 02/03/17 on evaluation today patient left medial lower extremity blister appears to be doing about the same. It is still continuing to drain and there's still the blistered skin covering the wound bed which is making it difficult for the alternate to do its job. Fortunately there is no evidence of cellulitis. No fevers chills noted. Patient states in general he is not having any significant discomfort. Patient's lower extremity arterial duplex exam revealed that patient was hemodynamically stable with no evidence of stenosis in regard to the bilateral lower extremities. The lower extremity venous reflux exam revealed the patient had venous incontinence noted in the right greater saphenous and bilateral common femoral vein. There is no evidence of deep or superficial vein thrombosis in the bilateral lower extremities. Readmission: 11/12/18 Patient presents for evaluation our clinic today concerning issues that he  is having with his left lower extremity. He tells me that a couple weeks ago he began developing blisters on the left lower extremity along with increased swelling. He typically wears his compression stockings on a regular basis is previously been evaluated both here as well is with vascular surgery they would recommend lymphedema pumps but unfortunately that somehow fell through and he never heard anything back from that. Nonetheless I think lymphedema pumps would be beneficial for this patient. He does have  a history of hypertension and diabetes. Obviously the chronic venous stasis and lymphedema as well. At this point the blisters have been given in more trouble he states sometimes when the blisters openings able to clean it down with alcohol and it will dry out and do well. Unfortunately that has not been the case this time. He is having some discomfort although this mean these with cleaning the areas he doesn't have discomfort just on a regular basis. He has not been able to wear his compression stockings since the blisters arose due to the fact that of course it will drain into the socks causing additional issues and he didn't have any way to wrap this otherwise. He has increased to taking his Lasix every day instead of every other day. He sees his primary care provider later this month as well. No fevers, chills, nausea, or vomiting noted at this time. 11/19/18-Patient returns at 1 week, per intake RN the amount of seepage into the compression wraps was definitely improved, overall all the wounds are measuring smaller but continuing silver alginate to the wounds as primary dressing 11/26/18 on evaluation today patient appears to be doing quite well in regard to his left lower [place] ulcers. In fact of the areas that were noted initially he only has two regions still open. There is no evidence of active infection at this time. He still is not heard anything from the company regarding lymphedema  pumps as of yet. Again as previously seen vascular they have not recommended any surgical intervention. 12/03/2018 on evaluation today patient actually appears to be doing quite well with regard to his lower extremity ulcers. In fact most of the areas appear to be healed the one spot which does not seem to be completely healed I am unsure of whether or not this is really draining that much but nonetheless there does not appear to be any signs of infection or significant drainage at this point. There is no sign of fever, chills, nausea, vomiting, or diarrhea. Overall I am pleased with how things have progressed I think is very close to being able to transition to his home compression stockings. TIBOR, LEMMONS ([id number]) 12/10/2018 upon evaluation today patient appears to be doing quite well with regard to his left lower extremity. He has been tolerating the dressing changes without complication. Fortunately there is no signs of active infection at this time. He appears after thorough evaluation of his leg to only have 1 small area that remains open at this point everything else appears to be almost completely closed. He still have significant swelling of the left lower extremity. We had discussed discussing this with his primary care provider he is not able to see her in person they were at the [facility] and right now the [place] is not seeing patients on site. According to the patient anyway. Subsequently he did speak with her apparently and his primary care provider feels that he may likely have a DVT. With that being said she has not seen his leg she is just going off of his history. Nonetheless that is a concern that the patient now has as well and while I do not feel the DVT is likely we can definitely ensure that that is not the case I will go ahead and see about putting that order in today. Nonetheless otherwise I am in a recommend that we continue with the current wound care measures including the  compression therapy most likely. We just need to ensure that  his leg is indeed free of any DVTs. 12/17/2018 on evaluation today patient actually appears to be completely healed today. He does have 2 very small areas of blistering although this is not anything too significant at this point which is good news. With that being said I am in agreement with the fact that I think he is completely healed at this point. He does want to get back into his compression stocking. The good news is we have gotten approval from insurance for his lymphedema pumps we received a letter since last saw him last week. The other good news is his study did come back and showed no evidence of a DVT. 12/20/2018 on evaluation today patient presents for follow-up concerning his ongoing issues with his left lower extremity. He was actually discharged last Friday and did fairly well until he states blisters opened this morning. He tells me he has been wearing his compression stocking although he has a hard time getting this on. There does not appear to be any signs of active infection at this time. No fevers, chills, nausea, vomiting, or diarrhea. 12/27/2018 on evaluation today patient appears to be doing very well with regard to his swelling of the left lower extremity the 4 layer compression wrap seems to have been beneficial for him. Fortunately there is no signs of active infection at this time. Patient has been tolerating the compression wrap without complication and his foot swelling in particular appears to be greatly improved. He does still have a wound on the lateral portion of his left leg I believe this is more of a blister that has now reopened. 01/03/2019 on evaluation today patient actually appears to be doing excellent in regard to his left lower extremity. He did receive his compression pumps and is actually use this 7 times since he was last here in the office. On top of the compression wrap he is now roughly 3 cm  better at the calf and 2 cm better at the ankle he also states that his foot seem to go an issue better without even having to use a shoe horn. Obviously I think this is all evidence that he is doing excellent in this regard. The other good news is he does not appear to have anything open today as far as wounds are concerned. 01/15/2019 on evaluation today patient appears to be doing more poorly yet again with regard to his left lower extremity. He has developed new wounds again after being discharged just recently. Unfortunately this continues to be the case that he will heal and then have subsequent new wounds. The last time I was hopeful that he may not end up coming back too quickly especially since he states he has been using his lymphedema pumps along with wearing his compression. Nonetheless he had a blister on the back of his leg that popped up on the left and this has opened up into an ulceration it is quite painful. 01/22/19 on evaluation today patient actually appears to be doing well with regard to his wound on the left lower extremity. He's been tolerating the dressing changes without complication including the compression wrap in the wound appears to be significantly smaller today which is great news. Overall very pleased in this regard. 01/29/2019 on evaluation today patient appears to be doing well with regard to his left posterior lower extremity ulcer. He has been tolerating the dressing changes without complication. This is not completely healed but is getting much closer. We did order a  Farrow wrap 4000 for him he has received this and has it with him today although I am not sure we are quite ready to start him on that as of yet. We are very close. 02/05/2019 on evaluation today patient actually appears to be doing quite well with regard to his left posterior lower extremity ulcer. He still has a very tiny opening remaining but the fortunate thing is he seems to be healing quite  nicely. He also did get his F[place] wrap which I am hoping will help with his edema control as well at home. Fortunately there is no evidence of active infection. 02/12/2019 patient and fortunately appears to be doing poorly in regard to his wounds of the left lower extremity. He was very close to healing therefore we attempted to use his Velcro compression wraps continuing with lymphedema pumps at home. Unfortunately that does not seem to have done very well for him. He tells me that he wore them all the time but again I am not sure why if that is the case that he is having such significant edema. He is still on his fluid pills as well. With that being said there is no obvious sign of infection although I do wonder about the possibility of infection at this time as well. 02/19/2019 unfortunately upon evaluation today patient appears to be doing more poorly with regard to his left lower extremity. He is not showing signs of significant improvement and I think the biggest issue here is that he does have an infection that appears to likely be Pseudomonas. That is based on the blue-green drainage that were noted at this time. Unfortunately the antibiotic that has been on is not going to take care of this at all. I think they will get a need to switch him to either Levaquin or Cipro and this was discussed with the patient. 02/26/2019 on evaluation today patient's lower extremity on the left appears to be doing significantly better as compared to last evaluation. Fortunately there is no signs of active infection at this time. He has been tolerating the compression wrap without complication in fact he made it the whole week at this point. He is showing signs of excellent improvement I am very happy in this regard. With that being said he is having some issues with infection we did review the results of his culture which I noted today. He did have a positive finding for Enterobacter as well as Alcaligenes  faecalis. Fortunately the Levaquin that I placed him on will work for both which is great news. There is no signs of systemic infection at this point. 10/30; left posterior leg wound in the setting of very significant edema and what looks like chronic venous inflammation. He has compression pumps but does not use them. We have been using 3 layer compression. Silver alginate to the wound as the primary dressing 03/18/2019 on evaluation today patient appears to be doing a little better compared to last time I saw him. He really has not been using his compression pumps he tells me that he is having too much discomfort. He has been keeping his wraps on however. He is only been taking his fluid pills every other day because he states they are not really helping and he has an appointment with his primary care provider at the [facility] tomorrow. Subsequently the wound itself on the left lower extremity does seem to be greatly improved compared to previous. 03/25/2019 on evaluation today patient appears to be doing  better with regard to his wounds on the bilateral lower extremities. The left is doing excellent the right is also doing better although both still do show some signs of open wounds noted at this point unfortunately. Fortunately there is no signs of active infection at this time. The patient also is not really having any significant pain which is good news. Unfortunately there was some confusion with the referral on vascular disease and as far as getting the patient scheduled there can be contacting him later today to do this JACOBY, ZANNI ([id number]) fortunately we got this straightened out. 04/01/2019 on evaluation today patient appears to be doing no fevers, chills, nausea, vomiting, or diarrhea. Excellent at this time with regard to his lower extremities. There does not appear to be any open wound at this point which is good news. Fortunately is also no signs of active infection at this time. Overall  feel like the patient has done excellent with the compression the problem is every time we got him to this point and then subsequently go to using his own compression things just go right back to where they were. I am not sure how to address this we can try to get an appointment with vascular for 2 weeks now they have yet to call him. Obviously this has become frustrating for the patient as well. I think the issue has just been an honest error as far as scheduling is concerned but nonetheless still worn out the point where I am unsure of which direction we should take. 04/08/2019 on evaluation today patient actually appears to be doing well with regard to his lower extremities. There are no open wounds at this time and things seem to be managing quite nicely as far as the overall edema control is concerned. With that being said he does have his compression socks today for [place] to go ahead and reinitiate therapy in that manner at this point. He is going to be going for shoes to be measured on Wednesday and then coincidentally he will also be seeing vascular on Thursday. Overall I think this is good news and again I am hopeful that they will be able to do something for him to help prevent ongoing issues with edema control as well. No fevers, chills, nausea, vomiting, or diarrhea. 04/11/2019 on evaluation today patient actually appears to be doing poorly after just being discharged on Monday of this week. He had been experiencing issues with again blisters especially on the left lower extremity. With that being said he was completely healed and appeared to be doing great this past Monday. He then subsequently has new blisters that formed before his appointment with vascular this morning. He was also measured for shoes in the interim. With that being said we may have figured out what exactly is going on and why he continues to have issues like what we are seeing at this point. He takes his compression  stockings off at nighttime and then he ends up having to sleep in his chair for 5-6 hours a night. He sleeps with his feet down he cannot really get him up in the recliner and therefore he is sleeping and the worst possible his position with his feet on the floor for that majority of the time. Again as I explained to him that is about one third at minimum at least one fourth of his day that he spending with his feet dangling down on the ground and the worst possible position they could be.  I think this may be what is causing the issue. Subsequently I am leaning toward thinking that he may need a hospital bed in order to elevate his legs. We likely can have to coordinate this with his primary care provider at the [facility]. Readmission: 01/26/2021 this is a patient who presents for repeat evaluation here in the clinic although it is actually been couple of years since have seen him in fact it was December 2020 when I last saw him. Subsequently he never really healed but did end up being lost to follow-up. He tells me has been having issues ongoing with his lower extremities has bilateral lower extremity lymphedema no real significant or definitive open wounds but in general his lymphedema is way out of control. We were never able to refer him to lymphedema clinic simply due to the fact to be honest we were never able to get him completely healed. I do not see anyone with open wounds. The patient does have evidence of type 2 diabetes mellitus, lymphedema, chronic venous insufficiency, and hypertension. That really has not changed since his last evaluation. 02/09/2021 upon evaluation today patient appears to be doing a little better in regard to his legs although he still having a tremendous amount of drainage especially on the left leg. Fortunately there does not appear to be any evidence of active infection. Of note when we looked into this further it appears that the patient did not have any  absorptive dressing on it was just the 4-layer compression wrap. Nonetheless this is probably big part of the issue here. 10/10; he comes in today with 3 large areas on the upper right lower leg likely remanence of denuded blistering under his compression wraps. He has no other wounds on the right. On the left he has the denuded area on the left medial foot and ankle and on the left dorsal foot. Massive lymphedema in both feet dorsally. Using Zetuvit under compression We have increased home health visitation to twice a week to change the dressings and will change it once 02/22/2021 upon evaluation today patient appears to be doing well currently with regard to his wounds. He has been tolerating the dressing changes without complication. Fortunately there does not appear to be any evidence of active infection which is great news. No fevers, chills, nausea, vomiting, or diarrhea. The biggest issue I see currently is that home health is not putting any medicine on the actual wounds before wrapping. 03/01/2021 upon evaluation today the patient's right leg actually appears to be doing quite well which is great news there does not appear to be any evidence of active infection at this time. No fevers, chills, nausea, vomiting, or diarrhea. With that being said the patient is having issues on the left foot where he is having significant drainage is also an ammonia smell he does not have any animals at home and this makes me concerned about a bacteria producing urea as a byproduct. Again the possible common organisms will be E. coli, Proteus, and Enterococcus. All 3 of which can be successfully treated with Levaquin. For that reason I think that this may be a good option for [place] to consider placing him on and I did obtain a culture as well for confirmation sake. 03/08/2021 upon evaluation today patient appears to be doing unfortunately still somewhat poorly in regard to his leg ulcerations. He actually has an  area on the right leg where he blistered due to the fact that his wrap slid down  and caused an area of pinching on his skin and this has led to a significant issue here. 03/15/2021 upon evaluation today patient unfortunately has not been wrapped appropriately with absorptive dressings nor with the appropriate technique for the third layer of the 4-layer compression wrap. These are issues that we continue to try to address with the home health nurse. Also the absorptive dressing that she had was cut in half and therefore that causes things to leak out it does not actually trap the fluid in regard to the top of the foot overall I think that all these combined are really not seeing things improved significantly here. Fortunately there does not appear to be any signs of significant infection at this time which is good news. He still is having a tremendous amount of drainage. 03/22/2021 upon evaluation today patient appears to be draining tremendously. He still continues to tell me that he is using his pumps 2 times a day and that coupled with that tells me that he is elevating his legs as well. With that being said all things considered I am really just not seeing the improvement we would expect to see with the 4-layer compression wrap and all the above noted. He in fact had an extremely large Zetuvit dressing on both legs and that they were extremely filled to the max with fluid. This is after just being changed just before the weekend and this is Monday. Nonetheless I am concerned about the fact that there is something going on fairly significant that we cannot get any of this under control and that he is draining this significantly. He supposed be having an echocardiogram it sounds like scheduling has been an issue for him as far as getting in sooner. Its something to do with needing his cousin to drive him because of where it sat and he cannot drive himself to this appointment either way I really think  he needs to try to see what he can do about making this happen a little sooner. He tells me he will call today. ERNESTO, ZUKOWSKI ([id number]) 03/29/2021 on evaluation today patient appears to be doing about the same in regard to his legs. He did get his cardiology appointment moved up to 6 December which is at least good that is better than what it was before mid December. Overall very pleased in that regard. 04/05/2021 upon evaluation today patient unfortunately is still doing fairly poorly. There does not appear to be any signs of active infection at this time. No fevers, chills, nausea, vomiting, or diarrhea. Unfortunately I think until his edema is under control and overall fluid overload there is really not to be much chance that I can do much to get him better. This is quite unfortunate and frustrating both for myself and the patient to be perfectly honest. Nonetheless I think that he really needs to have a conversation both with his primary care provider as well as cardiologist he sees the PCP on Monday and cardiology on Wednesday of next week. 04/13/2021 upon evaluation today patient appears to be doing poorly in regard to his bilateral lower extremities his left is still worse than the right. With that being said he has a tremendous amount of drainage he did see his primary care provider yesterday there really was not much there to be done from their perspective. He sees cardiology tomorrow. Nonetheless my biggest concern here is simply that if we do not get the edema under control he is going to continue to  have drainage and honestly I think at some point he is going to become infected severely that is my main concern. 04/19/2021 upon evaluation today patient appears to be doing poorly still in regard to his legs. Unfortunately there does not appear to be any signs of infection at this point. He does have a tremendous amount of drainage however. We have not seen the results back from the  cardiologist and the echocardiogram that was done. It appears that the patient checked out okay as far as that is concerned with regard to ejection fraction though we still have some issues here to be honest with his diastolic function. I am unsure if this is accounting for everything that we are seeing or not. Either way he has a tremendous amount of drainage from his legs that we are just not able to control in the outpatient setting at this point. I have reached out to Dr. Rockey Situ his cardiologist to see once he reviews the sheet if there is anything that he feels like can be done from an outpatient perspective if not then I think the way to go is probably can to be through inpatient admission and diuresis. Otherwise I am not sure how working to get this under control we tried antibiotics, compression wrapping, and I have told the patient to be elevating his legs I am not sure how much he does of this but either way I think that this is still an ongoing issue nonetheless. 04/26/2021 upon evaluation today patient appears to be doing poorly in regard to his legs. He is having a tremendous amount of fluid at this point which is quite unfortunate. Its to the point that he may have had at least 5 to 10 pounds of fluid in his dressings this morning when they were removed these were changed this Friday. Subsequently I think he needs to go to the ER for further evaluation and treatment I think is probably can need diuresis possibly even IV antibiotics been on what the blood work looks like but in general I feel like he needs something to get this under control from an outpatient perspective absent of everywhere I can think of and I cannot get this under control with our traditional measures. I think this is going require more so that we can get him better 12/30; this is a patient with severe bilateral lymphedema. He was hospitalized from 04/26/2021 through 04/29/2021 treated for cellulitis in the setting of  lower extremity ulcers and lymphedema. After he left the hospital he is apparently seen for nurse visit our staff contacted cardiology and he has been started on Lasix 40 mg. Apparently his legs have less edema. Lab work from 05/04/2021 showed a BUN of 38 and creatinine of 1.59 these are elevated versus previous where his creatinine seems to have been 1.30 on 12/19 his potassium is 4.3. I believe the lab work is being followed by cardiology We have him in a 4-layer wrap. Xeroform on the leg wounds and sit to fit on the Berry damage skin on the left dorsal foot versus right dorsal foot. He has compression pumps but does not use them. We have apparently not yet ordered him compression stockings 05/17/2021 upon evaluation today patient's legs though better than last time I personally saw him appear to be getting worse compared to where they were previous. Dr. Quentin Cornwall was actually last 1 to see you I have not seen him since 19 December. That was before he went into the ER. Coming  out apparently his legs looked also and they still look better but not as good as they were in the past. 1/16; patient with severe bilateral lymphedema. Severe scaled hyperkeratotic skin on the dorsal aspect of his distal left foot and left medial ankle.. On the right side changes are not as bad. He did not have any weeping edema. Our intake nurse was convinced that he is being compliant with compression pumps 1 hour twice a day Objective Constitutional Patient is hypertensive.. Pulse regular and within target range for patient.Marland Kitchen Respirations regular, non-labored and within target range.. Temperature is normal and within the target range for the patient.Marland Kitchen appears in no distress. Vitals Time Taken: 3:30 PM, Height: 73 in, Weight: 312 lbs, BMI: 41.2, Temperature: 97.8 F, Pulse: 72 bpm, Respiratory Rate: 18 breaths/min, Blood Pressure: 173/85 mmHg. General Notes: Wound exam; minimal weeping areas on the left foot. I did not see  anything on the right. We have been placing sit to fit on weeping areas and putting him in 4-layer compression. He is being compliant with his compression pumps per our intake nurse. No evidence of infection Integumentary (Hair, Skin) Wound #17 status is Open. Original cause of wound was Blister. The date acquired was: 04/24/2021. The wound has been in treatment 4 weeks. The wound is located on the Left,Anterior Lower Leg. The wound measures 0.5cm length x 0.5cm width x 0.1cm depth; 0.196cm^2 area and 0.02cm^3 volume. There is Fat Layer (Subcutaneous Tissue) exposed. There is no tunneling or undermining noted. There is a large amount of serous drainage noted. There is large (67-100%) red granulation within the wound bed. There is a small (1-33%) amount of necrotic tissue within Cornish, BLAIDEN ([id number]) the wound bed including Adherent Slough. Assessment Active Problems ICD-10 Type 2 diabetes mellitus with other skin ulcer Lymphedema, not elsewhere classified Venous insufficiency (chronic) (peripheral) Non-pressure chronic ulcer of other part of left lower leg with fat layer exposed Non-pressure chronic ulcer of other part of right foot with fat layer exposed Essential (primary) hypertension Non-pressure chronic ulcer of other part of right lower leg limited to breakdown of skin Procedures Wound #17 Pre-procedure diagnosis of Wound #17 is a Lymphedema located on the Left,Anterior Lower Leg . There was a Four Layer Compression Therapy Procedure by [name], RN. Post procedure Diagnosis Wound #17: Same as Pre-Procedure There was a Four Layer Compression Therapy Procedure by [name], RN. Post procedure Diagnosis Wound #: Same as Pre-Procedure Plan Follow-up Appointments: Return Appointment in 1 week. Nurse Visit as needed Home Health: [place]: - [place] for wound care. May utilize formulary equivalent dressing for wound treatment orders unless  otherwise specified. Home Health Nurse may visit PRN to address patient s wound care needs. - frequency 3 times per week - patient will be seen once at wound center - home health to see patient 2 times per week Bathing/ Shower/ Hygiene: May shower with wound dressing protected with water repellent cover or cast protector. No tub bath. Edema Control - Lymphedema / Segmental Compressive Device / Other: Optional: One layer of unna paste to top of compression wrap (to act as an anchor). - Unna paste on feet and calf to secure wrap in place 4 Layer Compression System Lymphedema. - bilateral legs bi lat, apply zetuvits/ or formulary for extra absorbant dressing around ankles and foot area, 3 times per week COTTON LAYER SPIRAL, LIGHT TAN SPIRAL. WHITE WITH YELLOW LINE FIGURE 8 , COBAN SPIRAL Elevate, Exercise Daily and Avoid Standing  for Long Periods of Time. Elevate legs to the level of the heart and pump ankles as often as possible Elevate leg(s) parallel to the floor when sitting. Compression Pump: Use compression pump on left lower extremity for 60 minutes, twice daily. - 2 times per day DO YOUR BEST to sleep in the bed at night. DO NOT sleep in your recliner. Long hours of sitting in a recliner leads to swelling of the legs and/or potential wounds on your backside. Other: - Contact prescriber regarding use of diuretics to reduce fluid overload. Off-Loading: Open toe surgical shoe WOUND #17: - Lower Leg Wound Laterality: Left, Anterior Cleanser: Soap and Water 3 x Per Week/30 Days Discharge Instructions: Gently cleanse wound with antibacterial soap, rinse and pat dry prior to dressing wounds Secondary Dressing: Zetuvit Plus Silicone Non-bordered 5x5 (in/in) 3 x Per Week/30 Days Discharge Instructions: cover feet and any draining areas Compression Wrap: Medichoice 4 layer Compression System, 35-40 mmHG 3 x Per Week/30 Days Discharge Instructions: Apply multi-layer wrap as directed. Shonto,  [name] ([id number]) 1. I think the patient actually looks quite good compared to when I saw him a few weeks ago. 2. I continued the same dressing and wraps. It would be nice if we if he would use his compression pumps. We have folded ABDs on the dorsal foot 3. At some point I think ammonium lactate on the skin on his dorsal foot might be effective is an exfoliating Electronic Signature(s) Signed: 05/25/2021 7:38:21 AM By: Linton Ham MD Entered By: Linton Ham on 05/24/2021 16:21:04 [name] ([id number]) -------------------------------------------------------------------------------- SuperBill Details Patient Name: [name] Date of Service: 05/24/2021 Medical Record Number: [id number] Patient Account Number: [id number] Date of Birth/Sex: [birth year] (71 y.o. M) Treating RN: [name] Primary Care Provider: Felipa Eth Other Clinician: Referring Provider: Felipa Eth Treating Provider/Extender: Tito Dine in Treatment: 16 Diagnosis Coding ICD-10 Codes Code Description E11.622 Type 2 diabetes mellitus with other skin ulcer I89.0 Lymphedema, not elsewhere classified I87.2 Venous insufficiency (chronic) (peripheral) L97.822 Non-pressure chronic ulcer of other part of left lower leg with fat layer exposed L97.512 Non-pressure chronic ulcer of other part of right foot with fat layer exposed I10 Essential (primary) hypertension L97.811 Non-pressure chronic ulcer of other part of right lower leg limited to breakdown of skin Facility Procedures CPT4: Description Modifier Quantity Code [id number] BILATERAL: Application of multi-layer venous compression system; leg (below knee), including 1 ankle and foot. Physician Procedures CPT4 Code: 1884166 Description: 06301 - WC PHYS LEVEL 3 - EST PT Modifier: Quantity: 1 CPT4 Code: Description: ICD-10 Diagnosis Description L97.822 Non-pressure chronic ulcer of other part of left lower leg with fat layer  L97.512 Non-pressure chronic ulcer of other part of right foot with fat layer exp I89.0 Lymphedema, not elsewhere classified Modifier: exposed osed Quantity: Electronic Signature(s) Signed: 05/24/2021 4:45:11 PM By: [name] RN Signed: 05/25/2021 7:38:21 AM By: Linton Ham MD Entered By: [name] on 05/24/2021 16:45:10

## 2021-05-25 NOTE — Progress Notes (Signed)
[name], [name] ([id number]) Visit Report for 05/24/2021 Arrival Information Details Patient Name: [name], [name] Date of Service: 05/24/2021 3:30 PM Medical Record Number: [id number] Patient Account Number: [id number] Date of Birth/Sex: 22-Sep-1950 (71 y.o. M) Treating RN: [name] Primary Care Laurisa Sahakian: Felipa Eth Other Clinician: Referring Kenneshia Rehm: Felipa Eth Treating Leland Staszewski/Extender: Tito Dine in Treatment: 16 Visit Information History Since Last Visit All ordered tests and consults were completed: No Patient Arrived: [name] Added or deleted any medications: No Arrival Time: 15:25 Any new allergies or adverse reactions: No Accompanied By: self Had a fall or experienced change in No Transfer Assistance: None activities of daily living that may affect Patient Identification Verified: Yes risk of falls: Secondary Verification Process Completed: Yes Signs or symptoms of abuse/neglect since last visito No Patient Requires Transmission-Based No Hospitalized since last visit: No Precautions: Implantable device outside of the clinic excluding No Patient Has Alerts: Yes cellular tissue based products placed in the center Patient Alerts: AVVS consult on file since last visit: Last ABI-R 1.09; L Has Dressing in Place as Prescribed: Yes 1.04 Has Compression in Place as Prescribed: Yes Pain Present Now: No Electronic Signature(s) Signed: 05/25/2021 7:52:30 AM By: [name] RN Entered By: [name] on 05/24/2021 15:30:01 [name] ([id number]) -------------------------------------------------------------------------------- Clinic Level of Care Assessment Details Patient Name: [name] Date of Service: 05/24/2021 3:30 PM Medical Record Number: [id number] Patient Account Number: [id number] Date of Birth/Sex: 03-20-1951 (71 y.o. M) Treating RN: [name] Primary Care Keyanah Kozicki: Felipa Eth Other Clinician: Referring Josian Lanese: Felipa Eth Treating Debroh Sieloff/Extender: Tito Dine in Treatment: 16 Clinic Level of Care Assessment Items TOOL 1 Quantity Score []  - Use when EandM and Procedure is performed on INITIAL visit 0 ASSESSMENTS - Nursing Assessment / Reassessment []  - General Physical Exam (combine w/ comprehensive assessment (listed just below) when performed on new 0 pt. evals) []  - 0 Comprehensive Assessment (HX, ROS, Risk Assessments, Wounds Hx, etc.) ASSESSMENTS - Wound and Skin Assessment / Reassessment []  - Dermatologic / Skin Assessment (not related to wound area) 0 ASSESSMENTS - Ostomy and/or Continence Assessment and Care []  - Incontinence Assessment and Management 0 []  - 0 Ostomy Care Assessment and Management (repouching, etc.) PROCESS - Coordination of Care []  - Simple Patient / Family Education for ongoing care 0 []  - 0 Complex (extensive) Patient / Family Education for ongoing care []  - 0 Staff obtains Programmer, systems, Records, Test Results / Process Orders []  - 0 Staff telephones HHA, Nursing Homes / Clarify orders / etc []  - 0 Routine Transfer to another Facility (non-emergent condition) []  - 0 Routine Hospital Admission (non-emergent condition) []  - 0 New Admissions / Biomedical engineer / Ordering NPWT, Apligraf, etc. []  - 0 Emergency Hospital Admission (emergent condition) PROCESS - Special Needs []  - Pediatric / Minor Patient Management 0 []  - 0 Isolation Patient Management []  - 0 Hearing / Language / Visual special needs []  - 0 Assessment of Community assistance (transportation, D/C planning, etc.) []  - 0 Additional assistance / Altered mentation []  - 0 Support Surface(s) Assessment (bed, cushion, seat, etc.) INTERVENTIONS - Miscellaneous []  - External ear exam 0 []  - 0 Patient Transfer (multiple staff / Civil Service fast streamer / Similar devices) []  - 0 Simple Staple / Suture removal (25 or less) []  - 0 Complex Staple / Suture removal (26 or more) []  -  0 Hypo/Hyperglycemic Management (do not check if billed separately) []  - 0 Ankle / Brachial Index (ABI) - do not check if billed separately Has the  patient been seen at the hospital within the last three years: Yes Total Score: 0 Level Of Care: ____ [name] ([id number]) Electronic Signature(s) Signed: 05/25/2021 7:52:30 AM By: [name] RN Entered By: [name] on 05/24/2021 16:02:36 [name] ([id number]) -------------------------------------------------------------------------------- Compression Therapy Details Patient Name: [name] Date of Service: 05/24/2021 3:30 PM Medical Record Number: [id number] Patient Account Number: [id number] Date of Birth/Sex: 31-Mar-1951 (71 y.o. M) Treating RN: [name] Primary Care Constantine Ruddick: Felipa Eth Other Clinician: Referring Majd Tissue: Felipa Eth Treating Imojean Yoshino/Extender: Tito Dine in Treatment: 16 Compression Therapy Performed for Wound Assessment: Wound #17 Left,Anterior Lower Leg Performed By: Clinician [name], RN Compression Type: Four Layer Post Procedure Diagnosis Same as Pre-procedure Electronic Signature(s) Signed: 05/25/2021 7:52:30 AM By: [name] RN Entered By: [name] on 05/24/2021 16:01:19 [name] ([id number]) -------------------------------------------------------------------------------- Compression Therapy Details Patient Name: [name] Date of Service: 05/24/2021 3:30 PM Medical Record Number: [id number] Patient Account Number: [id number] Date of Birth/Sex: [birth year] (71 y.o. M) Treating RN: [name] Primary Care Jostin Rue: Felipa Eth Other Clinician: Referring Maddyson Keil: Felipa Eth Treating Selma Mink/Extender: Tito Dine in Treatment: 16 Compression Therapy Performed for Wound Assessment: NonWound Condition Lymphedema - Right Leg Performed By: Clinician [name], RN Compression Type: Four Layer Post Procedure  Diagnosis Same as Pre-procedure Electronic Signature(s) Signed: 05/25/2021 7:52:30 AM By: [name] RN Entered By: [name] on 05/24/2021 16:01:46 [name] ([id number]) -------------------------------------------------------------------------------- Encounter Discharge Information Details Patient Name: [name] Date of Service: 05/24/2021 3:30 PM Medical Record Number: [id number] Patient Account Number: [id number] Date of Birth/Sex: 09/13/1950 (71 y.o. M) Treating RN: [name] Primary Care Laveyah Oriol: Felipa Eth Other Clinician: Referring La Dibella: Felipa Eth Treating Thimothy Barretta/Extender: Tito Dine in Treatment: 56 Encounter Discharge Information Items Discharge Condition: Stable Ambulatory Status: Ambulatory Discharge Destination: Home Transportation: Private Auto Accompanied By: self Schedule Follow-up Appointment: Yes Clinical Summary of Care: Patient Declined Electronic Signature(s) Signed: 05/25/2021 7:52:30 AM By: [name] RN Entered By: [name] on 05/24/2021 16:22:01 [name] ([id number]) -------------------------------------------------------------------------------- Lower Extremity Assessment Details Patient Name: [name] Date of Service: 05/24/2021 3:30 PM Medical Record Number: [id number] Patient Account Number: [id number] Date of Birth/Sex: 1950/08/16 (71 y.o. M) Treating RN: [name] Primary Care Tyniesha Howald: Felipa Eth Other Clinician: Referring Len Azeez: Felipa Eth Treating Quita Mcgrory/Extender: Tito Dine in Treatment: 16 Edema Assessment Assessed: [Left: No] [Right: No] Edema: [Left: Yes] [Right: Yes] Calf Left: Right: Point of Measurement: 35 cm From Medial Instep 39 cm 39 cm Ankle Left: Right: Point of Measurement: 10 cm From Medial Instep 34 cm 33 cm Vascular Assessment Pulses: Dorsalis Pedis Palpable: [Left:No] [Right:No] Electronic Signature(s) Signed: 05/25/2021  7:52:30 AM By: [name] RN Entered By: [name] on 05/24/2021 15:58:48 [name] ([id number]) -------------------------------------------------------------------------------- Multi Wound Chart Details Patient Name: [name] Date of Service: 05/24/2021 3:30 PM Medical Record Number: [id number] Patient Account Number: [id number] Date of Birth/Sex: 07/12/1950 (71 y.o. M) Treating RN: [name] Primary Care Johnnay Pleitez: Felipa Eth Other Clinician: Referring Brewster Wolters: Felipa Eth Treating Wilmore Holsomback/Extender: Tito Dine in Treatment: 16 Vital Signs Height(in): 73 Pulse(bpm): 62 Weight(lbs): 312 Blood Pressure(mmHg): 173/85 Body Mass Index(BMI): 41 Temperature(F): 97.8 Respiratory Rate(breaths/min): 18 Photos: [17:No Photos] [N/A:N/A] Wound Location: [17:Left, Anterior Lower Leg] [N/A:N/A] Wounding Event: [17:Blister] [N/A:N/A] Primary Etiology: [17:Lymphedema] [N/A:N/A] Comorbid History: [17:Hypertension, Peripheral Venous Disease, Type II Diabetes, Neuropathy, Received Chemotherapy] [N/A:N/A] Date Acquired: [17:04/24/2021] [N/A:N/A] Weeks of Treatment: [17:4] [N/A:N/A] Wound Status: [17:Open] [N/A:N/A] Measurements L x W x D (cm) [17:0.5x0.5x0.1] [N/A:N/A]  Area (cm) : [17:0.196] [N/A:N/A] Volume (cm) : [17:0.02] [N/A:N/A] % Reduction in Area: [17:99.50%] [N/A:N/A] % Reduction in Volume: [17:99.50%] [N/A:N/A] Classification: [17:Full Thickness Without Exposed Support Structures] [N/A:N/A] Exudate Amount: [17:Large] [N/A:N/A] Exudate Type: [17:Serous] [N/A:N/A] Exudate Color: [17:amber] [N/A:N/A] Granulation Amount: [17:Large (67-100%)] [N/A:N/A] Granulation Quality: [17:Red] [N/A:N/A] Necrotic Amount: [17:Small (1-33%)] [N/A:N/A] Exposed Structures: [17:Fat Layer (Subcutaneous Tissue): Yes Fascia: No Tendon: No Muscle: No Joint: No Bone: No None] [N/A:N/A N/A] Treatment Notes Electronic Signature(s) Signed: 05/25/2021 7:52:30 AM By: [name] RN Entered By: [name] on 05/24/2021 15:59:13 [name] ([id number]) -------------------------------------------------------------------------------- [place] Details Patient Name: [name] Date of Service: 05/24/2021 3:30 PM Medical Record Number: [id number] Patient Account Number: [id number] Date of Birth/Sex: 08-27-1950 (71 y.o. M) Treating RN: [name] Primary Care Gardenia Witter: Felipa Eth Other Clinician: Referring Taylon Coole: Felipa Eth Treating Aidyn Kellis/Extender: Tito Dine in Treatment: 16 Active Inactive Electronic Signature(s) Signed: 05/25/2021 7:52:30 AM By: [name] RN Entered By: [name] on 05/24/2021 15:58:59 [name] ([id number]) -------------------------------------------------------------------------------- Pain Assessment Details Patient Name: [name] Date of Service: 05/24/2021 3:30 PM Medical Record Number: [id number] Patient Account Number: [id number] Date of Birth/Sex: October 08, 1950 (71 y.o. M) Treating RN: [name] Primary Care Maya Arcand: Felipa Eth Other Clinician: Referring Deakon Frix: Felipa Eth Treating Lovely Kerins/Extender: Tito Dine in Treatment: 16 Active Problems Location of Pain Severity and Description of Pain Patient Has Paino No Site Locations Pain Management and Medication Current Pain Management: Electronic Signature(s) Signed: 05/25/2021 7:52:30 AM By: [name] RN Entered By: [name] on 05/24/2021 15:30:29 [name] ([id number]) -------------------------------------------------------------------------------- Patient/Caregiver Education Details Patient Name: [name] Date of Service: 05/24/2021 3:30 PM Medical Record Number: [id number] Patient Account Number: [id number] Date of Birth/Gender: 1950/06/12 (71 y.o. M) Treating RN: [name] Primary Care Physician: Felipa Eth Other Clinician: Referring Physician:  Felipa Eth Treating Physician/Extender: Tito Dine in Treatment: 16 Education Assessment Education Provided To: Patient Education Topics Provided Wound/Skin Impairment: Methods: Explain/Verbal Responses: State content correctly Electronic Signature(s) Signed: 05/25/2021 7:52:30 AM By: [name] RN Entered By: [name] on 05/24/2021 16:03:15 [name] ([id number]) -------------------------------------------------------------------------------- Wound Assessment Details Patient Name: [name] Date of Service: 05/24/2021 3:30 PM Medical Record Number: [id number] Patient Account Number: [id number] Date of Birth/Sex: June 28, 1950 (71 y.o. M) Treating RN: [name] Primary Care Emonee Winkowski: Felipa Eth Other Clinician: Referring Casha Estupinan: Felipa Eth Treating Lashai Grosch/Extender: Tito Dine in Treatment: 16 Wound Status Wound Number: 17 Primary Lymphedema Etiology: Wound Location: Left, Anterior Lower Leg Wound Open Wounding Event: Blister Status: Date Acquired: 04/24/2021 Comorbid Hypertension, Peripheral Venous Disease, Type II Weeks Of Treatment: 4 History: Diabetes, Neuropathy, Received Chemotherapy Clustered Wound: No Wound Measurements Length: (cm) 0.5 Width: (cm) 0.5 Depth: (cm) 0.1 Area: (cm) 0.196 Volume: (cm) 0.02 % Reduction in Area: 99.5% % Reduction in Volume: 99.5% Epithelialization: None Tunneling: No Undermining: No Wound Description Classification: Full Thickness Without Exposed Support Structures Exudate Amount: Large Exudate Type: Serous Exudate Color: amber Foul Odor After Cleansing: No Slough/Fibrino Yes Wound Bed Granulation Amount: Large (67-100%) Exposed Structure Granulation Quality: Red Fascia Exposed: No Necrotic Amount: Small (1-33%) Fat Layer (Subcutaneous Tissue) Exposed: Yes Necrotic Quality: Adherent Slough Tendon Exposed: No Muscle Exposed: No Joint Exposed: No Bone Exposed:  No Treatment Notes Wound #17 (Lower Leg) Wound Laterality: Left, Anterior Cleanser Soap and Water Discharge Instruction: Gently cleanse wound with antibacterial soap, rinse and pat dry prior to dressing wounds Peri-Wound Care Topical Primary Dressing Secondary Dressing Zetuvit Plus Silicone Non-bordered 5x5 (in/in) Discharge  Instruction: cover feet and any draining areas Secured With Compression Wrap Medichoice 4 layer Compression System, 35-40 mmHG Discharge Instruction: Apply multi-layer wrap as directed. Compression Stockings [place], [place] ([id number]) Add-Ons Electronic Signature(s) Signed: 05/25/2021 7:52:30 AM By: [name] RN Entered By: [name] on 05/24/2021 15:57:21 [name] ([id number]) -------------------------------------------------------------------------------- Vitals Details Patient Name: [name] Date of Service: 05/24/2021 3:30 PM Medical Record Number: [id number] Patient Account Number: [id number] Date of Birth/Sex: 04/10/1951 (71 y.o. M) Treating RN: [name] Primary Care Mitchael Luckey: Felipa Eth Other Clinician: Referring Raquell Richer: Felipa Eth Treating Adlene Adduci/Extender: Tito Dine in Treatment: 16 Vital Signs Time Taken: 15:30 Temperature (F): 97.8 Height (in): 73 Pulse (bpm): 72 Weight (lbs): 312 Respiratory Rate (breaths/min): 18 Body Mass Index (BMI): 41.2 Blood Pressure (mmHg): 173/85 Reference Range: 80 - 120 mg / dl Electronic Signature(s) Signed: 05/25/2021 7:52:30 AM By: [name] RN Entered By: [name] on 05/24/2021 15:30:20

## 2021-05-31 ENCOUNTER — Encounter: Payer: PPO | Admitting: Physician Assistant

## 2021-05-31 ENCOUNTER — Other Ambulatory Visit: Payer: Self-pay

## 2021-05-31 DIAGNOSIS — E11622 Type 2 diabetes mellitus with other skin ulcer: Secondary | ICD-10-CM | POA: Diagnosis not present

## 2021-05-31 NOTE — Progress Notes (Addendum)
KALID, GHAN ([id number]) Visit Report for 05/31/2021 Chief Complaint Document Details Patient Name: [name], [name] Date of Service: 05/31/2021 1:00 PM Medical Record Number: [id number] Patient Account Number: [id number] Date of Birth/Sex: 1951-02-13 (71 y.o. M) Treating RN: Donnamarie Poag Primary Care Provider: Felipa Eth Other Clinician: Referring Provider: Felipa Eth Treating Provider/Extender: Skipper Cliche in Treatment: 17 Information Obtained from: Patient Chief Complaint Left LE ulcers Electronic Signature(s) Signed: 05/31/2021 12:59:09 PM By: Worthy Keeler PA-C Entered By: Worthy Keeler on 05/31/2021 12:59:09 [name] ([id number]) -------------------------------------------------------------------------------- HPI Details Patient Name: [name] Date of Service: 05/31/2021 1:00 PM Medical Record Number: [id number] Patient Account Number: [id number] Date of Birth/Sex: November 03, 1950 (71 y.o. M) Treating RN: Donnamarie Poag Primary Care Provider: Felipa Eth Other Clinician: Referring Provider: Felipa Eth Treating Provider/Extender: Skipper Cliche in Treatment: 17 History of Present Illness HPI Description: 71 year old male who presented to the ER with bilateral lower extremity blisters which had started last week. he has a past medical history of leukemia, diabetes mellitus, hypertension, edema of both lower extremities, his recurrent skin infections, peripheral vascular disease, coronary artery disease, congestive heart failure and peripheral neuropathy. in the ER he was given Rocephin and put on Silvadene cream. he was put on oral doxycycline and was asked to follow-up with the [facility]. His last hemoglobin A1c was 6.6 in December and he checks his blood sugar once a week. He does not have any physicians outside the [place] system. He does not recall any vascular duplex studies done either for arterial or venous disease but was told to wear compression  stockings which he does not use 05/30/2016 -- we have not yet received any of his notes from the [facility] hospital system and his arterial and venous duplex studies are scheduled here in [place] around mid February. We are unable to have his insurance accepted by home health agencies and hence he is getting dressings only once a week. 06/06/16 -- -- I received a call from the patient's PCP at the [facility] at [facility] and spoke to Dr. Garvin Fila, phone number [phone] and fax number [phone]. She confirmed that no vascular testing was done over the last 5 years and she would be happy to do them if the patient did want them to be done at the [place] and we could fax him a request. Readmission: 71 year old male seen by as in February of this year and was referred to vein and vascular for studies and opinion from the vascular surgeons. The patient returns today with a fresh problem having had blisters on his left lower extremity which have been there for about 5 days and he clearly states that he has been wearing his compression stockings as advised though he could not read the moderate compression and has been wearing light compression. Review of his electronic medical records note that he had lower extremity arterial duplex examination done on 06/23/2016 which showed no hemodynamically significant stenosis in the bilateral lower extremity arterial system. He also had a lower extremity venous reflux examination done on 07/07/2016 and it was noted that he had venous incompetence in the right great saphenous vein and bilateral common femoral veins. Patient was seen by Dr. Tamala Julian on the same day and for some reason his notes do not reflect the venous studies or the arterial studies and he recommended patient do a venous duplex ultrasound to look for reflux and return to see him.he would also consider a lymph pump if required. The patient was told that his workup  was normal and hence the patient canceled his  follow-up appointment. 02/03/17 on evaluation today patient left medial lower extremity blister appears to be doing about the same. It is still continuing to drain and there's still the blistered skin covering the wound bed which is making it difficult for the alternate to do its job. Fortunately there is no evidence of cellulitis. No fevers chills noted. Patient states in general he is not having any significant discomfort. Patient's lower extremity arterial duplex exam revealed that patient was hemodynamically stable with no evidence of stenosis in regard to the bilateral lower extremities. The lower extremity venous reflux exam revealed the patient had venous incontinence noted in the right greater saphenous and bilateral common femoral vein. There is no evidence of deep or superficial vein thrombosis in the bilateral lower extremities. Readmission: 11/12/18 Patient presents for evaluation our clinic today concerning issues that he is having with his left lower extremity. He tells me that a couple weeks ago he began developing blisters on the left lower extremity along with increased swelling. He typically wears his compression stockings on a regular basis is previously been evaluated both here as well is with vascular surgery they would recommend lymphedema pumps but unfortunately that somehow fell through and he never heard anything back from that. Nonetheless I think lymphedema pumps would be beneficial for this patient. He does have a history of hypertension and diabetes. Obviously the chronic venous stasis and lymphedema as well. At this point the blisters have been given in more trouble he states sometimes when the blisters openings able to clean it down with alcohol and it will dry out and do well. Unfortunately that has not been the case this time. He is having some discomfort although this mean these with cleaning the areas he doesn't have discomfort just on a regular basis. He has not been  able to wear his compression stockings since the blisters arose due to the fact that of course it will drain into the socks causing additional issues and he didn't have any way to wrap this otherwise. He has increased to taking his Lasix every day instead of every other day. He sees his primary care provider later this month as well. No fevers, chills, nausea, or vomiting noted at this time. 11/19/18-Patient returns at 1 week, per intake RN the amount of seepage into the compression wraps was definitely improved, overall all the wounds are measuring smaller but continuing silver alginate to the wounds as primary dressing 11/26/18 on evaluation today patient appears to be doing quite well in regard to his left lower [place] ulcers. In fact of the areas that were noted initially he only has two regions still open. There is no evidence of active infection at this time. He still is not heard anything from the company regarding lymphedema pumps as of yet. Again as previously seen vascular they have not recommended any surgical intervention. 12/03/2018 on evaluation today patient actually appears to be doing quite well with regard to his lower extremity ulcers. In fact most of the areas appear to be healed the one spot which does not seem to be completely healed I am unsure of whether or not this is really draining that much but nonetheless there does not appear to be any signs of infection or significant drainage at this point. There is no sign of fever, chills, nausea, vomiting, or diarrhea. Overall I am pleased with how things have progressed I think is very close to being able to transition  to his home compression stockings. [name], [name] ([id number]) 12/10/2018 upon evaluation today patient appears to be doing quite well with regard to his left lower extremity. He has been tolerating the dressing changes without complication. Fortunately there is no signs of active infection at this time. He appears after  thorough evaluation of his leg to only have 1 small area that remains open at this point everything else appears to be almost completely closed. He still have significant swelling of the left lower extremity. We had discussed discussing this with his primary care provider he is not able to see her in person they were at the [facility] and right now the [place] is not seeing patients on site. According to the patient anyway. Subsequently he did speak with her apparently and his primary care provider feels that he may likely have a DVT. With that being said she has not seen his leg she is just going off of his history. Nonetheless that is a concern that the patient now has as well and while I do not feel the DVT is likely we can definitely ensure that that is not the case I will go ahead and see about putting that order in today. Nonetheless otherwise I am in a recommend that we continue with the current wound care measures including the compression therapy most likely. We just need to ensure that his leg is indeed free of any DVTs. 12/17/2018 on evaluation today patient actually appears to be completely healed today. He does have 2 very small areas of blistering although this is not anything too significant at this point which is good news. With that being said I am in agreement with the fact that I think he is completely healed at this point. He does want to get back into his compression stocking. The good news is we have gotten approval from insurance for his lymphedema pumps we received a letter since last saw him last week. The other good news is his study did come back and showed no evidence of a DVT. 12/20/2018 on evaluation today patient presents for follow-up concerning his ongoing issues with his left lower extremity. He was actually discharged last Friday and did fairly well until he states blisters opened this morning. He tells me he has been wearing his compression stocking although he has a hard  time getting this on. There does not appear to be any signs of active infection at this time. No fevers, chills, nausea, vomiting, or diarrhea. 12/27/2018 on evaluation today patient appears to be doing very well with regard to his swelling of the left lower extremity the 4 layer compression wrap seems to have been beneficial for him. Fortunately there is no signs of active infection at this time. Patient has been tolerating the compression wrap without complication and his foot swelling in particular appears to be greatly improved. He does still have a wound on the lateral portion of his left leg I believe this is more of a blister that has now reopened. 01/03/2019 on evaluation today patient actually appears to be doing excellent in regard to his left lower extremity. He did receive his compression pumps and is actually use this 7 times since he was last here in the office. On top of the compression wrap he is now roughly 3 cm better at the calf and 2 cm better at the ankle he also states that his foot seem to go an issue better without even having to use a shoe horn. Obviously I  think this is all evidence that he is doing excellent in this regard. The other good news is he does not appear to have anything open today as far as wounds are concerned. 01/15/2019 on evaluation today patient appears to be doing more poorly yet again with regard to his left lower extremity. He has developed new wounds again after being discharged just recently. Unfortunately this continues to be the case that he will heal and then have subsequent new wounds. The last time I was hopeful that he may not end up coming back too quickly especially since he states he has been using his lymphedema pumps along with wearing his compression. Nonetheless he had a blister on the back of his leg that popped up on the left and this has opened up into an ulceration it is quite painful. 01/22/19 on evaluation today patient actually appears  to be doing well with regard to his wound on the left lower extremity. He's been tolerating the dressing changes without complication including the compression wrap in the wound appears to be significantly smaller today which is great news. Overall very pleased in this regard. 01/29/2019 on evaluation today patient appears to be doing well with regard to his left posterior lower extremity ulcer. He has been tolerating the dressing changes without complication. This is not completely healed but is getting much closer. We did order a Farrow wrap 4000 for him he has received this and has it with him today although I am not sure we are quite ready to start him on that as of yet. We are very close. 02/05/2019 on evaluation today patient actually appears to be doing quite well with regard to his left posterior lower extremity ulcer. He still has a very tiny opening remaining but the fortunate thing is he seems to be healing quite nicely. He also did get his Farrow wrap which I am hoping will help with his edema control as well at home. Fortunately there is no evidence of active infection. 02/12/2019 patient and fortunately appears to be doing poorly in regard to his wounds of the left lower extremity. He was very close to healing therefore we attempted to use his Velcro compression wraps continuing with lymphedema pumps at home. Unfortunately that does not seem to have done very well for him. He tells me that he wore them all the time but again I am not sure why if that is the case that he is having such significant edema. He is still on his fluid pills as well. With that being said there is no obvious sign of infection although I do wonder about the possibility of infection at this time as well. 02/19/2019 unfortunately upon evaluation today patient appears to be doing more poorly with regard to his left lower extremity. He is not showing signs of significant improvement and I think the biggest issue here is  that he does have an infection that appears to likely be Pseudomonas. That is based on the blue-green drainage that were noted at this time. Unfortunately the antibiotic that has been on is not going to take care of this at all. I think they will get a need to switch him to either Levaquin or Cipro and this was discussed with the patient. 02/26/2019 on evaluation today patient's lower extremity on the left appears to be doing significantly better as compared to last evaluation. Fortunately there is no signs of active infection at this time. He has been tolerating the compression wrap without complication in  fact he made it the whole week at this point. He is showing signs of excellent improvement I am very happy in this regard. With that being said he is having some issues with infection we did review the results of his culture which I noted today. He did have a positive finding for Enterobacter as well as Alcaligenes faecalis. Fortunately the Levaquin that I placed him on will work for both which is great news. There is no signs of systemic infection at this point. 10/30; left posterior leg wound in the setting of very significant edema and what looks like chronic venous inflammation. He has compression pumps but does not use them. We have been using 3 layer compression. Silver alginate to the wound as the primary dressing 03/18/2019 on evaluation today patient appears to be doing a little better compared to last time I saw him. He really has not been using his compression pumps he tells me that he is having too much discomfort. He has been keeping his wraps on however. He is only been taking his fluid pills every other day because he states they are not really helping and he has an appointment with his primary care provider at the [facility] tomorrow. Subsequently the wound itself on the left lower extremity does seem to be greatly improved compared to previous. 03/25/2019 on evaluation today patient appears  to be doing better with regard to his wounds on the bilateral lower extremities. The left is doing excellent the right is also doing better although both still do show some signs of open wounds noted at this point unfortunately. Fortunately there is no signs of active infection at this time. The patient also is not really having any significant pain which is good news. Unfortunately there was some confusion with the referral on vascular disease and as far as getting the patient scheduled there can be contacting him later today to do this [name], [name] ([id number]) fortunately we got this straightened out. 04/01/2019 on evaluation today patient appears to be doing no fevers, chills, nausea, vomiting, or diarrhea. Excellent at this time with regard to his lower extremities. There does not appear to be any open wound at this point which is good news. Fortunately is also no signs of active infection at this time. Overall feel like the patient has done excellent with the compression the problem is every time we got him to this point and then subsequently go to using his own compression things just go right back to where they were. I am not sure how to address this we can try to get an appointment with vascular for 2 weeks now they have yet to call him. Obviously this has become frustrating for the patient as well. I think the issue has just been an honest error as far as scheduling is concerned but nonetheless still worn out the point where I am unsure of which direction we should take. 04/08/2019 on evaluation today patient actually appears to be doing well with regard to his lower extremities. There are no open wounds at this time and things seem to be managing quite nicely as far as the overall edema control is concerned. With that being said he does have his compression socks today for [place] to go ahead and reinitiate therapy in that manner at this point. He is going to be going for shoes to be  measured on Wednesday and then coincidentally he will also be seeing vascular on Thursday. Overall I think this is good news and  again I am hopeful that they will be able to do something for him to help prevent ongoing issues with edema control as well. No fevers, chills, nausea, vomiting, or diarrhea. 04/11/2019 on evaluation today patient actually appears to be doing poorly after just being discharged on Monday of this week. He had been experiencing issues with again blisters especially on the left lower extremity. With that being said he was completely healed and appeared to be doing great this past Monday. He then subsequently has new blisters that formed before his appointment with vascular this morning. He was also measured for shoes in the interim. With that being said we may have figured out what exactly is going on and why he continues to have issues like what we are seeing at this point. He takes his compression stockings off at nighttime and then he ends up having to sleep in his chair for 5-6 hours a night. He sleeps with his feet down he cannot really get him up in the recliner and therefore he is sleeping and the worst possible his position with his feet on the floor for that majority of the time. Again as I explained to him that is about one third at minimum at least one fourth of his day that he spending with his feet dangling down on the ground and the worst possible position they could be. I think this may be what is causing the issue. Subsequently I am leaning toward thinking that he may need a hospital bed in order to elevate his legs. We likely can have to coordinate this with his primary care provider at the [facility]. Readmission: 01/26/2021 this is a patient who presents for repeat evaluation here in the clinic although it is actually been couple of years since have seen him in fact it was December 2020 when I last saw him. Subsequently he never really healed but did end up  being lost to follow-up. He tells me has been having issues ongoing with his lower extremities has bilateral lower extremity lymphedema no real significant or definitive open wounds but in general his lymphedema is way out of control. We were never able to refer him to lymphedema clinic simply due to the fact to be honest we were never able to get him completely healed. I do not see anyone with open wounds. The patient does have evidence of type 2 diabetes mellitus, lymphedema, chronic venous insufficiency, and hypertension. That really has not changed since his last evaluation. 02/09/2021 upon evaluation today patient appears to be doing a little better in regard to his legs although he still having a tremendous amount of drainage especially on the left leg. Fortunately there does not appear to be any evidence of active infection. Of note when we looked into this further it appears that the patient did not have any absorptive dressing on it was just the 4-layer compression wrap. Nonetheless this is probably big part of the issue here. 10/10; he comes in today with 3 large areas on the upper right lower leg likely remanence of denuded blistering under his compression wraps. He has no other wounds on the right. On the left he has the denuded area on the left medial foot and ankle and on the left dorsal foot. Massive lymphedema in both feet dorsally. Using Zetuvit under compression We have increased home health visitation to twice a week to change the dressings and will change it once 02/22/2021 upon evaluation today patient appears to be doing well currently  with regard to his wounds. He has been tolerating the dressing changes without complication. Fortunately there does not appear to be any evidence of active infection which is great news. No fevers, chills, nausea, vomiting, or diarrhea. The biggest issue I see currently is that home health is not putting any medicine on the actual wounds  before wrapping. 03/01/2021 upon evaluation today the patient's right leg actually appears to be doing quite well which is great news there does not appear to be any evidence of active infection at this time. No fevers, chills, nausea, vomiting, or diarrhea. With that being said the patient is having issues on the left foot where he is having significant drainage is also an ammonia smell he does not have any animals at home and this makes me concerned about a bacteria producing urea as a byproduct. Again the possible common organisms will be E. coli, Proteus, and Enterococcus. All 3 of which can be successfully treated with Levaquin. For that reason I think that this may be a good option for [place] to consider placing him on and I did obtain a culture as well for confirmation sake. 03/08/2021 upon evaluation today patient appears to be doing unfortunately still somewhat poorly in regard to his leg ulcerations. He actually has an area on the right leg where he blistered due to the fact that his wrap slid down and caused an area of pinching on his skin and this has led to a significant issue here. 03/15/2021 upon evaluation today patient unfortunately has not been wrapped appropriately with absorptive dressings nor with the appropriate technique for the third layer of the 4-layer compression wrap. These are issues that we continue to try to address with the home health nurse. Also the absorptive dressing that she had was cut in half and therefore that causes things to leak out it does not actually trap the fluid in regard to the top of the foot overall I think that all these combined are really not seeing things improved significantly here. Fortunately there does not appear to be any signs of significant infection at this time which is good news. He still is having a tremendous amount of drainage. 03/22/2021 upon evaluation today patient appears to be draining tremendously. He still continues to tell me  that he is using his pumps 2 times a day and that coupled with that tells me that he is elevating his legs as well. With that being said all things considered I am really just not seeing the improvement we would expect to see with the 4-layer compression wrap and all the above noted. He in fact had an extremely large Zetuvit dressing on both legs and that they were extremely filled to the max with fluid. This is after just being changed just before the weekend and this is Monday. Nonetheless I am concerned about the fact that there is something going on fairly significant that we cannot get any of this under control and that he is draining this significantly. He supposed be having an echocardiogram it sounds like scheduling has been an issue for him as far as getting in sooner. Its something to do with needing his cousin to drive him because of where it sat and he cannot drive himself to this appointment either way I really think he needs to try to see what he can do about making this happen a little sooner. He tells me he will call today. [name], [name] ([id number]) 03/29/2021 on evaluation today patient appears to be  doing about the same in regard to his legs. He did get his cardiology appointment moved up to 6 December which is at least good that is better than what it was before mid December. Overall very pleased in that regard. 04/05/2021 upon evaluation today patient unfortunately is still doing fairly poorly. There does not appear to be any signs of active infection at this time. No fevers, chills, nausea, vomiting, or diarrhea. Unfortunately I think until his edema is under control and overall fluid overload there is really not to be much chance that I can do much to get him better. This is quite unfortunate and frustrating both for myself and the patient to be perfectly honest. Nonetheless I think that he really needs to have a conversation both with his primary care provider as well as  cardiologist he sees the PCP on Monday and cardiology on Wednesday of next week. 04/13/2021 upon evaluation today patient appears to be doing poorly in regard to his bilateral lower extremities his left is still worse than the right. With that being said he has a tremendous amount of drainage he did see his primary care provider yesterday there really was not much there to be done from their perspective. He sees cardiology tomorrow. Nonetheless my biggest concern here is simply that if we do not get the edema under control he is going to continue to have drainage and honestly I think at some point he is going to become infected severely that is my main concern. 04/19/2021 upon evaluation today patient appears to be doing poorly still in regard to his legs. Unfortunately there does not appear to be any signs of infection at this point. He does have a tremendous amount of drainage however. We have not seen the results back from the cardiologist and the echocardiogram that was done. It appears that the patient checked out okay as far as that is concerned with regard to ejection fraction though we still have some issues here to be honest with his diastolic function. I am unsure if this is accounting for everything that we are seeing or not. Either way he has a tremendous amount of drainage from his legs that we are just not able to control in the outpatient setting at this point. I have reached out to Dr. Rockey Situ his cardiologist to see once he reviews the sheet if there is anything that he feels like can be done from an outpatient perspective if not then I think the way to go is probably can to be through inpatient admission and diuresis. Otherwise I am not sure how working to get this under control we tried antibiotics, compression wrapping, and I have told the patient to be elevating his legs I am not sure how much he does of this but either way I think that this is still an ongoing issue  nonetheless. 04/26/2021 upon evaluation today patient appears to be doing poorly in regard to his legs. He is having a tremendous amount of fluid at this point which is quite unfortunate. Its to the point that he may have had at least 5 to 10 pounds of fluid in his dressings this morning when they were removed these were changed this Friday. Subsequently I think he needs to go to the ER for further evaluation and treatment I think is probably can need diuresis possibly even IV antibiotics been on what the blood work looks like but in general I feel like he needs something to get this under control from  an outpatient perspective absent of everywhere I can think of and I cannot get this under control with our traditional measures. I think this is going require more so that we can get him better 12/30; this is a patient with severe bilateral lymphedema. He was hospitalized from 04/26/2021 through 04/29/2021 treated for cellulitis in the setting of lower extremity ulcers and lymphedema. After he left the hospital he is apparently seen for nurse visit our staff contacted cardiology and he has been started on Lasix 40 mg. Apparently his legs have less edema. Lab work from 05/04/2021 showed a BUN of 38 and creatinine of 1.59 these are elevated versus previous where his creatinine seems to have been 1.30 on 12/19 his potassium is 4.3. I believe the lab work is being followed by cardiology We have him in a 4-layer wrap. Xeroform on the leg wounds and sit to fit on the Berry damage skin on the left dorsal foot versus right dorsal foot. He has compression pumps but does not use them. We have apparently not yet ordered him compression stockings 05/17/2021 upon evaluation today patient's legs though better than last time I personally saw him appear to be getting worse compared to where they were previous. Dr. Quentin Cornwall was actually last 1 to see you I have not seen him since 19 December. That was before he went into  the ER. Coming out apparently his legs looked also and they still look better but not as good as they were in the past. 1/16; patient with severe bilateral lymphedema. Severe scaled hyperkeratotic skin on the dorsal aspect of his distal left foot and left medial ankle.. On the right side changes are not as bad. He did not have any weeping edema. Our intake nurse was convinced that he is being compliant with compression pumps 1 hour twice a day 05/31/2021 upon evaluation today patient actually appears to be doing a little bit better in my opinion in regard to his feet. I do not see as much drainage and it being just completely wet as it was previous. Fortunately I do not also see any signs of active infection which is great news as well. Electronic Signature(s) Signed: 05/31/2021 1:34:49 PM By: Worthy Keeler PA-C Entered By: Worthy Keeler on 05/31/2021 13:34:49 [name] ([id number]) -------------------------------------------------------------------------------- Physical Exam Details Patient Name: [name] Date of Service: 05/31/2021 1:00 PM Medical Record Number: [id number] Patient Account Number: [id number] Date of Birth/Sex: 1951-05-07 (71 y.o. M) Treating RN: Donnamarie Poag Primary Care Provider: Felipa Eth Other Clinician: Referring Provider: Felipa Eth Treating Provider/Extender: Skipper Cliche in Treatment: 17 Constitutional Obese and well-hydrated in no acute distress. Respiratory normal breathing without difficulty. Psychiatric this patient is able to make decisions and demonstrates good insight into disease process. Alert and Oriented x 3. pleasant and cooperative. Notes Upon inspection patient's wound bed actually showed signs of good granulation epithelization everything appears to be completely healed with regard to the wounds themselves. Nonetheless the biggest issue here is he still has some weeping in and around the toe areas although this does seem  to be better compared to previous. Electronic Signature(s) Signed: 05/31/2021 1:35:08 PM By: Worthy Keeler PA-C Entered By: Worthy Keeler on 05/31/2021 13:35:08 [name] ([id number]) -------------------------------------------------------------------------------- Physician Orders Details Patient Name: [name] Date of Service: 05/31/2021 1:00 PM Medical Record Number: [id number] Patient Account Number: [id number] Date of Birth/Sex: 07/27/1950 (71 y.o. M) Treating RN: Donnamarie Poag Primary Care Provider: Felipa Eth Other Clinician: Referring Provider: Felipa Eth  Treating Provider/Extender: Skipper Cliche in Treatment: 17 Verbal / Phone Orders: No Diagnosis Coding ICD-10 Coding Code Description E11.622 Type 2 diabetes mellitus with other skin ulcer I89.0 Lymphedema, not elsewhere classified I87.2 Venous insufficiency (chronic) (peripheral) L97.822 Non-pressure chronic ulcer of other part of left lower leg with fat layer exposed L97.512 Non-pressure chronic ulcer of other part of right foot with fat layer exposed I10 Essential (primary) hypertension L97.811 Non-pressure chronic ulcer of other part of right lower leg limited to breakdown of skin Follow-up Appointments o Return Appointment in 1 week. o Nurse Visit as needed [place] for wound care. May utilize formulary equivalent dressing for wound treatment orders unless otherwise specified. Home Health Nurse may visit PRN to address patientos wound care needs. - frequency 3 times per week - patient will be seen once at wound center - home health to see patient 2 times per week o Scheduled days for dressing changes to be completed; exception, patient has scheduled wound care visit that day. o **Please direct any NON-WOUND related issues/requests for orders to patient's Primary Care Physician. **If current dressing causes regression in wound  condition, may D/C ordered dressing product/s and apply Normal Saline Moist Dressing daily until next [facility] or Other MD appointment. **Notify Wound Healing Center of regression in wound condition at [phone]. Bathing/ Shower/ Hygiene o May shower with wound dressing protected with water repellent cover or cast protector. o No tub bath. Edema Control - Lymphedema / Segmental Compressive Device / Other Bilateral Lower Extremities o Optional: One layer of unna paste to top of compression wrap (to act as an anchor). - Unna paste on feet and calf to secure wrap in place as needed o 4 Layer Compression System Lymphedema. - bilateral legs bi lat, apply zetuvits/ or formulary for extra absorbant dressing around ankles and foot area, 3 times per week COTTON LAYER SPIRAL, LIGHT TAN SPIRAL. WHITE WITH YELLOW LINE FIGURE 8 , COBAN SPIRAL o Elevate, Exercise Daily and Avoid Standing for Long Periods of Time. o Elevate legs to the level of the heart and pump ankles as often as possible o Elevate leg(s) parallel to the floor when sitting. o Compression Pump: Use compression pump on left lower extremity for 60 minutes, twice daily. - 2 times per day o DO YOUR BEST to sleep in the bed at night. DO NOT sleep in your recliner. Long hours of sitting in a recliner leads to swelling of the legs and/or potential wounds on your backside. o Other: - Contact prescriber regarding use of diuretics to reduce fluid overload. Off-Loading Bilateral Lower Extremities o Open toe surgical shoe - [facility] appt to fit for footwear o Turn and reposition every 2 hours Additional Orders / Instructions o Follow Nutritious Diet and Increase Protein Intake Wound Treatment Wound #17 - Lower Leg Wound Laterality: Left, Anterior Cleanser: Soap and Water 3 x Per Week/30 Days DERREN, [name] ([id number]) Discharge Instructions: Gently cleanse wound with antibacterial soap, rinse and pat  dry prior to dressing wounds Cleanser: Wound Cleanser 3 x Per Week/30 Days Discharge Instructions: Wash your hands with soap and water. Remove old dressing, discard into plastic bag and place into trash. Cleanse the wound with Wound Cleanser prior to applying a clean dressing using gauze sponges, not tissues or cotton balls. Do not scrub or use excessive force. Pat dry using gauze sponges, not tissue or cotton balls. Secondary Dressing: Zetuvit Plus Silicone Non-bordered 5x5 (in/in)  3 x Per Week/30 Days Discharge Instructions: cover feet and any draining areas Compression Wrap: Medichoice 4 layer Compression System, 35-40 mmHG 3 x Per Week/30 Days Discharge Instructions: Apply multi-layer wrap as directed. Electronic Signature(s) Signed: 05/31/2021 4:33:32 PM By: Donnamarie Poag Signed: 05/31/2021 4:34:41 PM By: Worthy Keeler PA-C Entered By: Donnamarie Poag on 05/31/2021 13:33:14 [name] ([id number]) -------------------------------------------------------------------------------- Problem List Details Patient Name: [name] Date of Service: 05/31/2021 1:00 PM Medical Record Number: [id number] Patient Account Number: [id number] Date of Birth/Sex: [birth year] (71 y.o. M) Treating RN: Donnamarie Poag Primary Care Provider: Felipa Eth Other Clinician: Referring Provider: Felipa Eth Treating Provider/Extender: Skipper Cliche in Treatment: 17 Active Problems ICD-10 Encounter Code Description Active Date MDM Diagnosis E11.622 Type 2 diabetes mellitus with other skin ulcer 01/26/2021 No Yes I89.0 Lymphedema, not elsewhere classified 01/26/2021 No Yes I87.2 Venous insufficiency (chronic) (peripheral) 01/26/2021 No Yes L97.822 Non-pressure chronic ulcer of other part of left lower leg with fat layer 01/26/2021 No Yes exposed L97.512 Non-pressure chronic ulcer of other part of right foot with fat layer 01/26/2021 No Yes exposed [place] (primary) hypertension 01/26/2021 No  Yes L97.811 Non-pressure chronic ulcer of other part of right lower leg limited to 02/15/2021 No Yes breakdown of skin Inactive Problems Resolved Problems Electronic Signature(s) Signed: 05/31/2021 12:59:03 PM By: Worthy Keeler PA-C Entered By: Worthy Keeler on 05/31/2021 12:59:03 [name] ([id number]) -------------------------------------------------------------------------------- Progress Note Details Patient Name: [name] Date of Service: 05/31/2021 1:00 PM Medical Record Number: [id number] Patient Account Number: [id number] Date of Birth/Sex: 1950-06-19 (71 y.o. M) Treating RN: Donnamarie Poag Primary Care Provider: Felipa Eth Other Clinician: Referring Provider: Felipa Eth Treating Provider/Extender: Skipper Cliche in Treatment: 17 Subjective Chief Complaint Information obtained from Patient Left LE ulcers History of Present Illness (HPI) 70 year old male who presented to the ER with bilateral lower extremity blisters which had started last week. he has a past medical history of leukemia, diabetes mellitus, hypertension, edema of both lower extremities, his recurrent skin infections, peripheral vascular disease, coronary artery disease, congestive heart failure and peripheral neuropathy. in the ER he was given Rocephin and put on Silvadene cream. he was put on oral doxycycline and was asked to follow-up with the [facility]. His last hemoglobin A1c was 6.6 in December and he checks his blood sugar once a week. He does not have any physicians outside the [place] system. He does not recall any vascular duplex studies done either for arterial or venous disease but was told to wear compression stockings which he does not use 05/30/2016 -- we have not yet received any of his notes from the [facility] hospital system and his arterial and venous duplex studies are scheduled here in [place] around mid February. We are unable to have his insurance accepted by home health  agencies and hence he is getting dressings only once a week. 06/06/16 -- -- I received a call from the patient's PCP at the [facility] at [facility] and spoke to Dr. Garvin Fila, phone number [phone] and fax number [phone]. She confirmed that no vascular testing was done over the last 5 years and she would be happy to do them if the patient did want them to be done at the [place] and we could fax him a request. Readmission: 71 year old male seen by as in February of this year and was referred to vein and vascular for studies and opinion from the vascular surgeons. The patient returns today with a fresh problem having had blisters on his left  lower extremity which have been there for about 5 days and he clearly states that he has been wearing his compression stockings as advised though he could not read the moderate compression and has been wearing light compression. Review of his electronic medical records note that he had lower extremity arterial duplex examination done on 06/23/2016 which showed no hemodynamically significant stenosis in the bilateral lower extremity arterial system. He also had a lower extremity venous reflux examination done on 07/07/2016 and it was noted that he had venous incompetence in the right great saphenous vein and bilateral common femoral veins. Patient was seen by Dr. Tamala Julian on the same day and for some reason his notes do not reflect the venous studies or the arterial studies and he recommended patient do a venous duplex ultrasound to look for reflux and return to see him.he would also consider a lymph pump if required. The patient was told that his workup was normal and hence the patient canceled his follow-up appointment. 02/03/17 on evaluation today patient left medial lower extremity blister appears to be doing about the same. It is still continuing to drain and there's still the blistered skin covering the wound bed which is making it difficult for the alternate to  do its job. Fortunately there is no evidence of cellulitis. No fevers chills noted. Patient states in general he is not having any significant discomfort. Patient's lower extremity arterial duplex exam revealed that patient was hemodynamically stable with no evidence of stenosis in regard to the bilateral lower extremities. The lower extremity venous reflux exam revealed the patient had venous incontinence noted in the right greater saphenous and bilateral common femoral vein. There is no evidence of deep or superficial vein thrombosis in the bilateral lower extremities. Readmission: 11/12/18 Patient presents for evaluation our clinic today concerning issues that he is having with his left lower extremity. He tells me that a couple weeks ago he began developing blisters on the left lower extremity along with increased swelling. He typically wears his compression stockings on a regular basis is previously been evaluated both here as well is with vascular surgery they would recommend lymphedema pumps but unfortunately that somehow fell through and he never heard anything back from that. Nonetheless I think lymphedema pumps would be beneficial for this patient. He does have a history of hypertension and diabetes. Obviously the chronic venous stasis and lymphedema as well. At this point the blisters have been given in more trouble he states sometimes when the blisters openings able to clean it down with alcohol and it will dry out and do well. Unfortunately that has not been the case this time. He is having some discomfort although this mean these with cleaning the areas he doesn't have discomfort just on a regular basis. He has not been able to wear his compression stockings since the blisters arose due to the fact that of course it will drain into the socks causing additional issues and he didn't have any way to wrap this otherwise. He has increased to taking his Lasix every day instead of every other  day. He sees his primary care provider later this month as well. No fevers, chills, nausea, or vomiting noted at this time. 11/19/18-Patient returns at 1 week, per intake RN the amount of seepage into the compression wraps was definitely improved, overall all the wounds are measuring smaller but continuing silver alginate to the wounds as primary dressing 11/26/18 on evaluation today patient appears to be doing quite well in regard  to his left lower [place] ulcers. In fact of the areas that were noted initially he only has two regions still open. There is no evidence of active infection at this time. He still is not heard anything from the company regarding lymphedema pumps as of yet. Again as previously seen vascular they have not recommended any surgical intervention. [name], [name] ([id number]) 12/03/2018 on evaluation today patient actually appears to be doing quite well with regard to his lower extremity ulcers. In fact most of the areas appear to be healed the one spot which does not seem to be completely healed I am unsure of whether or not this is really draining that much but nonetheless there does not appear to be any signs of infection or significant drainage at this point. There is no sign of fever, chills, nausea, vomiting, or diarrhea. Overall I am pleased with how things have progressed I think is very close to being able to transition to his home compression stockings. 12/10/2018 upon evaluation today patient appears to be doing quite well with regard to his left lower extremity. He has been tolerating the dressing changes without complication. Fortunately there is no signs of active infection at this time. He appears after thorough evaluation of his leg to only have 1 small area that remains open at this point everything else appears to be almost completely closed. He still have significant swelling of the left lower extremity. We had discussed discussing this with his primary care provider  he is not able to see her in person they were at the [facility] and right now the [place] is not seeing patients on site. According to the patient anyway. Subsequently he did speak with her apparently and his primary care provider feels that he may likely have a DVT. With that being said she has not seen his leg she is just going off of his history. Nonetheless that is a concern that the patient now has as well and while I do not feel the DVT is likely we can definitely ensure that that is not the case I will go ahead and see about putting that order in today. Nonetheless otherwise I am in a recommend that we continue with the current wound care measures including the compression therapy most likely. We just need to ensure that his leg is indeed free of any DVTs. 12/17/2018 on evaluation today patient actually appears to be completely healed today. He does have 2 very small areas of blistering although this is not anything too significant at this point which is good news. With that being said I am in agreement with the fact that I think he is completely healed at this point. He does want to get back into his compression stocking. The good news is we have gotten approval from insurance for his lymphedema pumps we received a letter since last saw him last week. The other good news is his study did come back and showed no evidence of a DVT. 12/20/2018 on evaluation today patient presents for follow-up concerning his ongoing issues with his left lower extremity. He was actually discharged last Friday and did fairly well until he states blisters opened this morning. He tells me he has been wearing his compression stocking although he has a hard time getting this on. There does not appear to be any signs of active infection at this time. No fevers, chills, nausea, vomiting, or diarrhea. 12/27/2018 on evaluation today patient appears to be doing very well with regard to his  swelling of the left lower extremity the  4 layer compression wrap seems to have been beneficial for him. Fortunately there is no signs of active infection at this time. Patient has been tolerating the compression wrap without complication and his foot swelling in particular appears to be greatly improved. He does still have a wound on the lateral portion of his left leg I believe this is more of a blister that has now reopened. 01/03/2019 on evaluation today patient actually appears to be doing excellent in regard to his left lower extremity. He did receive his compression pumps and is actually use this 7 times since he was last here in the office. On top of the compression wrap he is now roughly 3 cm better at the calf and 2 cm better at the ankle he also states that his foot seem to go an issue better without even having to use a shoe horn. Obviously I think this is all evidence that he is doing excellent in this regard. The other good news is he does not appear to have anything open today as far as wounds are concerned. 01/15/2019 on evaluation today patient appears to be doing more poorly yet again with regard to his left lower extremity. He has developed new wounds again after being discharged just recently. Unfortunately this continues to be the case that he will heal and then have subsequent new wounds. The last time I was hopeful that he may not end up coming back too quickly especially since he states he has been using his lymphedema pumps along with wearing his compression. Nonetheless he had a blister on the back of his leg that popped up on the left and this has opened up into an ulceration it is quite painful. 01/22/19 on evaluation today patient actually appears to be doing well with regard to his wound on the left lower extremity. He's been tolerating the dressing changes without complication including the compression wrap in the wound appears to be significantly smaller today which is great news. Overall very pleased in this  regard. 01/29/2019 on evaluation today patient appears to be doing well with regard to his left posterior lower extremity ulcer. He has been tolerating the dressing changes without complication. This is not completely healed but is getting much closer. We did order a Farrow wrap 4000 for him he has received this and has it with him today although I am not sure we are quite ready to start him on that as of yet. We are very close. 02/05/2019 on evaluation today patient actually appears to be doing quite well with regard to his left posterior lower extremity ulcer. He still has a very tiny opening remaining but the fortunate thing is he seems to be healing quite nicely. He also did get his Farrow wrap which I am hoping will help with his edema control as well at home. Fortunately there is no evidence of active infection. 02/12/2019 patient and fortunately appears to be doing poorly in regard to his wounds of the left lower extremity. He was very close to healing therefore we attempted to use his Velcro compression wraps continuing with lymphedema pumps at home. Unfortunately that does not seem to have done very well for him. He tells me that he wore them all the time but again I am not sure why if that is the case that he is having such significant edema. He is still on his fluid pills as well. With that being said there is no  obvious sign of infection although I do wonder about the possibility of infection at this time as well. 02/19/2019 unfortunately upon evaluation today patient appears to be doing more poorly with regard to his left lower extremity. He is not showing signs of significant improvement and I think the biggest issue here is that he does have an infection that appears to likely be Pseudomonas. That is based on the blue-green drainage that were noted at this time. Unfortunately the antibiotic that has been on is not going to take care of this at all. I think they will get a need to switch  him to either Levaquin or Cipro and this was discussed with the patient. 02/26/2019 on evaluation today patient's lower extremity on the left appears to be doing significantly better as compared to last evaluation. Fortunately there is no signs of active infection at this time. He has been tolerating the compression wrap without complication in fact he made it the whole week at this point. He is showing signs of excellent improvement I am very happy in this regard. With that being said he is having some issues with infection we did review the results of his culture which I noted today. He did have a positive finding for Enterobacter as well as Alcaligenes faecalis. Fortunately the Levaquin that I placed him on will work for both which is great news. There is no signs of systemic infection at this point. 10/30; left posterior leg wound in the setting of very significant edema and what looks like chronic venous inflammation. He has compression pumps but does not use them. We have been using 3 layer compression. Silver alginate to the wound as the primary dressing 03/18/2019 on evaluation today patient appears to be doing a little better compared to last time I saw him. He really has not been using his compression pumps he tells me that he is having too much discomfort. He has been keeping his wraps on however. He is only been taking his fluid pills every other day because he states they are not really helping and he has an appointment with his primary care provider at the [facility] tomorrow. Subsequently the wound itself on the left lower extremity does seem to be greatly improved compared to previous. DEYONTE, CADDEN ([id number]) 03/25/2019 on evaluation today patient appears to be doing better with regard to his wounds on the bilateral lower extremities. The left is doing excellent the right is also doing better although both still do show some signs of open wounds noted at this point unfortunately. Fortunately  there is no signs of active infection at this time. The patient also is not really having any significant pain which is good news. Unfortunately there was some confusion with the referral on vascular disease and as far as getting the patient scheduled there can be contacting him later today to do this fortunately we got this straightened out. 04/01/2019 on evaluation today patient appears to be doing no fevers, chills, nausea, vomiting, or diarrhea. Excellent at this time with regard to his lower extremities. There does not appear to be any open wound at this point which is good news. Fortunately is also no signs of active infection at this time. Overall feel like the patient has done excellent with the compression the problem is every time we got him to this point and then subsequently go to using his own compression things just go right back to where they were. I am not sure how to address this we can  try to get an appointment with vascular for 2 weeks now they have yet to call him. Obviously this has become frustrating for the patient as well. I think the issue has just been an honest error as far as scheduling is concerned but nonetheless still worn out the point where I am unsure of which direction we should take. 04/08/2019 on evaluation today patient actually appears to be doing well with regard to his lower extremities. There are no open wounds at this time and things seem to be managing quite nicely as far as the overall edema control is concerned. With that being said he does have his compression socks today for [place] to go ahead and reinitiate therapy in that manner at this point. He is going to be going for shoes to be measured on Wednesday and then coincidentally he will also be seeing vascular on Thursday. Overall I think this is good news and again I am hopeful that they will be able to do something for him to help prevent ongoing issues with edema control as well. No fevers, chills,  nausea, vomiting, or diarrhea. 04/11/2019 on evaluation today patient actually appears to be doing poorly after just being discharged on Monday of this week. He had been experiencing issues with again blisters especially on the left lower extremity. With that being said he was completely healed and appeared to be doing great this past Monday. He then subsequently has new blisters that formed before his appointment with vascular this morning. He was also measured for shoes in the interim. With that being said we may have figured out what exactly is going on and why he continues to have issues like what we are seeing at this point. He takes his compression stockings off at nighttime and then he ends up having to sleep in his chair for 5-6 hours a night. He sleeps with his feet down he cannot really get him up in the recliner and therefore he is sleeping and the worst possible his position with his feet on the floor for that majority of the time. Again as I explained to him that is about one third at minimum at least one fourth of his day that he spending with his feet dangling down on the ground and the worst possible position they could be. I think this may be what is causing the issue. Subsequently I am leaning toward thinking that he may need a hospital bed in order to elevate his legs. We likely can have to coordinate this with his primary care provider at the [facility]. Readmission: 01/26/2021 this is a patient who presents for repeat evaluation here in the clinic although it is actually been couple of years since have seen him in fact it was December 2020 when I last saw him. Subsequently he never really healed but did end up being lost to follow-up. He tells me has been having issues ongoing with his lower extremities has bilateral lower extremity lymphedema no real significant or definitive open wounds but in general his lymphedema is way out of control. We were never able to refer him to  lymphedema clinic simply due to the fact to be honest we were never able to get him completely healed. I do not see anyone with open wounds. The patient does have evidence of type 2 diabetes mellitus, lymphedema, chronic venous insufficiency, and hypertension. That really has not changed since his last evaluation. 02/09/2021 upon evaluation today patient appears to be doing a little better in  regard to his legs although he still having a tremendous amount of drainage especially on the left leg. Fortunately there does not appear to be any evidence of active infection. Of note when we looked into this further it appears that the patient did not have any absorptive dressing on it was just the 4-layer compression wrap. Nonetheless this is probably big part of the issue here. 10/10; he comes in today with 3 large areas on the upper right lower leg likely remanence of denuded blistering under his compression wraps. He has no other wounds on the right. On the left he has the denuded area on the left medial foot and ankle and on the left dorsal foot. Massive lymphedema in both feet dorsally. Using Zetuvit under compression We have increased home health visitation to twice a week to change the dressings and will change it once 02/22/2021 upon evaluation today patient appears to be doing well currently with regard to his wounds. He has been tolerating the dressing changes without complication. Fortunately there does not appear to be any evidence of active infection which is great news. No fevers, chills, nausea, vomiting, or diarrhea. The biggest issue I see currently is that home health is not putting any medicine on the actual wounds before wrapping. 03/01/2021 upon evaluation today the patient's right leg actually appears to be doing quite well which is great news there does not appear to be any evidence of active infection at this time. No fevers, chills, nausea, vomiting, or diarrhea. With that being said  the patient is having issues on the left foot where he is having significant drainage is also an ammonia smell he does not have any animals at home and this makes me concerned about a bacteria producing urea as a byproduct. Again the possible common organisms will be E. coli, Proteus, and Enterococcus. All 3 of which can be successfully treated with Levaquin. For that reason I think that this may be a good option for [place] to consider placing him on and I did obtain a culture as well for confirmation sake. 03/08/2021 upon evaluation today patient appears to be doing unfortunately still somewhat poorly in regard to his leg ulcerations. He actually has an area on the right leg where he blistered due to the fact that his wrap slid down and caused an area of pinching on his skin and this has led to a significant issue here. 03/15/2021 upon evaluation today patient unfortunately has not been wrapped appropriately with absorptive dressings nor with the appropriate technique for the third layer of the 4-layer compression wrap. These are issues that we continue to try to address with the home health nurse. Also the absorptive dressing that she had was cut in half and therefore that causes things to leak out it does not actually trap the fluid in regard to the top of the foot overall I think that all these combined are really not seeing things improved significantly here. Fortunately there does not appear to be any signs of significant infection at this time which is good news. He still is having a tremendous amount of drainage. 03/22/2021 upon evaluation today patient appears to be draining tremendously. He still continues to tell me that he is using his pumps 2 times a day and that coupled with that tells me that he is elevating his legs as well. With that being said all things considered I am really just not seeing the improvement we would expect to see with the 4-layer compression wrap  and all the above noted.  He in fact had an extremely large Zetuvit dressing on both legs and that they were extremely filled to the max with fluid. This is after just being changed just before the weekend and this [name], [name] ([id number]) is Monday. Nonetheless I am concerned about the fact that there is something going on fairly significant that we cannot get any of this under control and that he is draining this significantly. He supposed be having an echocardiogram it sounds like scheduling has been an issue for him as far as getting in sooner. Its something to do with needing his cousin to drive him because of where it sat and he cannot drive himself to this appointment either way I really think he needs to try to see what he can do about making this happen a little sooner. He tells me he will call today. 03/29/2021 on evaluation today patient appears to be doing about the same in regard to his legs. He did get his cardiology appointment moved up to 6 December which is at least good that is better than what it was before mid December. Overall very pleased in that regard. 04/05/2021 upon evaluation today patient unfortunately is still doing fairly poorly. There does not appear to be any signs of active infection at this time. No fevers, chills, nausea, vomiting, or diarrhea. Unfortunately I think until his edema is under control and overall fluid overload there is really not to be much chance that I can do much to get him better. This is quite unfortunate and frustrating both for myself and the patient to be perfectly honest. Nonetheless I think that he really needs to have a conversation both with his primary care provider as well as cardiologist he sees the PCP on Monday and cardiology on Wednesday of next week. 04/13/2021 upon evaluation today patient appears to be doing poorly in regard to his bilateral lower extremities his left is still worse than the right. With that being said he has a tremendous amount of  drainage he did see his primary care provider yesterday there really was not much there to be done from their perspective. He sees cardiology tomorrow. Nonetheless my biggest concern here is simply that if we do not get the edema under control he is going to continue to have drainage and honestly I think at some point he is going to become infected severely that is my main concern. 04/19/2021 upon evaluation today patient appears to be doing poorly still in regard to his legs. Unfortunately there does not appear to be any signs of infection at this point. He does have a tremendous amount of drainage however. We have not seen the results back from the cardiologist and the echocardiogram that was done. It appears that the patient checked out okay as far as that is concerned with regard to ejection fraction though we still have some issues here to be honest with his diastolic function. I am unsure if this is accounting for everything that we are seeing or not. Either way he has a tremendous amount of drainage from his legs that we are just not able to control in the outpatient setting at this point. I have reached out to Dr. Rockey Situ his cardiologist to see once he reviews the sheet if there is anything that he feels like can be done from an outpatient perspective if not then I think the way to go is probably can to be through inpatient admission and diuresis. Otherwise I  am not sure how working to get this under control we tried antibiotics, compression wrapping, and I have told the patient to be elevating his legs I am not sure how much he does of this but either way I think that this is still an ongoing issue nonetheless. 04/26/2021 upon evaluation today patient appears to be doing poorly in regard to his legs. He is having a tremendous amount of fluid at this point which is quite unfortunate. Its to the point that he may have had at least 5 to 10 pounds of fluid in his dressings this morning when  they were removed these were changed this Friday. Subsequently I think he needs to go to the ER for further evaluation and treatment I think is probably can need diuresis possibly even IV antibiotics been on what the blood work looks like but in general I feel like he needs something to get this under control from an outpatient perspective absent of everywhere I can think of and I cannot get this under control with our traditional measures. I think this is going require more so that we can get him better 12/30; this is a patient with severe bilateral lymphedema. He was hospitalized from 04/26/2021 through 04/29/2021 treated for cellulitis in the setting of lower extremity ulcers and lymphedema. After he left the hospital he is apparently seen for nurse visit our staff contacted cardiology and he has been started on Lasix 40 mg. Apparently his legs have less edema. Lab work from 05/04/2021 showed a BUN of 38 and creatinine of 1.59 these are elevated versus previous where his creatinine seems to have been 1.30 on 12/19 his potassium is 4.3. I believe the lab work is being followed by cardiology We have him in a 4-layer wrap. Xeroform on the leg wounds and sit to fit on the Berry damage skin on the left dorsal foot versus right dorsal foot. He has compression pumps but does not use them. We have apparently not yet ordered him compression stockings 05/17/2021 upon evaluation today patient's legs though better than last time I personally saw him appear to be getting worse compared to where they were previous. Dr. Quentin Cornwall was actually last 1 to see you I have not seen him since 19 December. That was before he went into the ER. Coming out apparently his legs looked also and they still look better but not as good as they were in the past. 1/16; patient with severe bilateral lymphedema. Severe scaled hyperkeratotic skin on the dorsal aspect of his distal left foot and left medial ankle.. On the right side  changes are not as bad. He did not have any weeping edema. Our intake nurse was convinced that he is being compliant with compression pumps 1 hour twice a day 05/31/2021 upon evaluation today patient actually appears to be doing a little bit better in my opinion in regard to his feet. I do not see as much drainage and it being just completely wet as it was previous. Fortunately I do not also see any signs of active infection which is great news as well. Objective Constitutional Obese and well-hydrated in no acute distress. Vitals Time Taken: 1:05 PM, Height: 73 in, Weight: 312 lbs, BMI: 41.2, Temperature: 98.2 F, Pulse: 77 bpm, Respiratory Rate: 16 breaths/min, Blood Pressure: 136/76 mmHg. Respiratory normal breathing without difficulty. [name], [name] ([id number]) Psychiatric this patient is able to make decisions and demonstrates good insight into disease process. Alert and Oriented x 3. pleasant and cooperative. General Notes:  Upon inspection patient's wound bed actually showed signs of good granulation epithelization everything appears to be completely healed with regard to the wounds themselves. Nonetheless the biggest issue here is he still has some weeping in and around the toe areas although this does seem to be better compared to previous. Integumentary (Hair, Skin) Wound #17 status is Healed - Epithelialized. Original cause of wound was Blister. The date acquired was: 04/24/2021. The wound has been in treatment 5 weeks. The wound is located on the Left,Anterior Lower Leg. The wound measures 0cm length x 0cm width x 0cm depth; 0cm^2 area and 0cm^3 volume. There is Fat Layer (Subcutaneous Tissue) exposed. There is a large amount of serous drainage noted. There is large (67- 100%) red granulation within the wound bed. There is a small (1-33%) amount of necrotic tissue within the wound bed. Other Condition(s) Patient presents with Lymphedema located on the Right Leg. Assessment Active  Problems ICD-10 Type 2 diabetes mellitus with other skin ulcer Lymphedema, not elsewhere classified Venous insufficiency (chronic) (peripheral) Non-pressure chronic ulcer of other part of left lower leg with fat layer exposed Non-pressure chronic ulcer of other part of right foot with fat layer exposed Essential (primary) hypertension Non-pressure chronic ulcer of other part of right lower leg limited to breakdown of skin Procedures Wound #17 Pre-procedure diagnosis of Wound #17 is a Lymphedema located on the Left,Anterior Lower Leg . There was a Four Layer Compression Therapy Procedure by Donnamarie Poag, RN. Post procedure Diagnosis Wound #17: Same as Pre-Procedure There was a Four Layer Compression Therapy Procedure by Donnamarie Poag, RN. Post procedure Diagnosis Wound #: Same as Pre-Procedure Plan Follow-up Appointments: Return Appointment in 1 week. Nurse Visit as needed Home Health: [place]: - [place] for wound care. May utilize formulary equivalent dressing for wound treatment orders unless otherwise specified. Home Health Nurse may visit PRN to address patient s wound care needs. - frequency 3 times per week - patient will be seen once at wound center - home health to see patient 2 times per week Scheduled days for dressing changes to be completed; exception, patient has scheduled wound care visit that day. **Please direct any NON-WOUND related issues/requests for orders to patient's Primary Care Physician. **If current dressing causes regression in wound condition, may D/C ordered dressing product/s and apply Normal Saline Moist Dressing daily until next [place] or Other MD appointment. **Notify Wound Healing Center of regression in wound condition at [phone]. Bathing/ Shower/ Hygiene: May shower with wound dressing protected with water repellent cover or cast protector. No tub bath. Edema Control - Lymphedema / Segmental Compressive  Device / Other: Optional: One layer of unna paste to top of compression wrap (to act as an anchor). - Unna paste on feet and calf to secure wrap in place as needed [place], [name] ([id number]) 4 Layer Compression System Lymphedema. - bilateral legs bi lat, apply zetuvits/ or formulary for extra absorbant dressing around ankles and foot area, 3 times per week COTTON LAYER SPIRAL, LIGHT TAN SPIRAL. WHITE WITH YELLOW LINE FIGURE 8 , COBAN SPIRAL Elevate, Exercise Daily and Avoid Standing for Long Periods of Time. Elevate legs to the level of the heart and pump ankles as often as possible Elevate leg(s) parallel to the floor when sitting. Compression Pump: Use compression pump on left lower extremity for 60 minutes, twice daily. - 2 times per day DO YOUR BEST to sleep in the bed at night. DO NOT sleep in your recliner. Long  hours of sitting in a recliner leads to swelling of the legs and/or potential wounds on your backside. Other: - Contact prescriber regarding use of diuretics to reduce fluid overload. Off-Loading: Open toe surgical shoe - [facility] appt to fit for footwear Turn and reposition every 2 hours Additional Orders / Instructions: Follow Nutritious Diet and Increase Protein Intake WOUND #17: - Lower Leg Wound Laterality: Left, Anterior Cleanser: Soap and Water 3 x Per Week/30 Days Discharge Instructions: Gently cleanse wound with antibacterial soap, rinse and pat dry prior to dressing wounds Cleanser: Wound Cleanser 3 x Per Week/30 Days Discharge Instructions: Wash your hands with soap and water. Remove old dressing, discard into plastic bag and place into trash. Cleanse the wound with Wound Cleanser prior to applying a clean dressing using gauze sponges, not tissues or cotton balls. Do not scrub or use excessive force. Pat dry using gauze sponges, not tissue or cotton balls. Secondary Dressing: Zetuvit Plus Silicone Non-bordered 5x5 (in/in) 3 x Per Week/30 Days Discharge  Instructions: cover feet and any draining areas Compression Wrap: Medichoice 4 layer Compression System, 35-40 mmHG 3 x Per Week/30 Days Discharge Instructions: Apply multi-layer wrap as directed. 1. I would recommend currently that we going to continue with the wound care measures as before specifically with regard to the silver alginate to the dorsal aspect of the foot. Again I do not see any signs of anything actively open on the legs which is great news and when to close that out today. With regard to the feet this is more of a weightbearing issue and there really are not any open wounds actively at this time which is great news. Overall I think that the main goal here is going to be trying to keep things as clean and dry as possible. 2. I am also can recommend he continue with his lymphedema pumps I do believe this has probably been helping him. 3. I am also can recommend that he continue to elevate his legs much as possible. 4. He did see his primary care provider at the [place] he does have an appointment on March 15 with the kidney doctor there as well his creatinine was apparently 0.1 higher than what it should have been. We will see what they have to say. We will see patient back for reevaluation in 1 week here in the clinic. If anything worsens or changes patient will contact our office for additional recommendations. Electronic Signature(s) Signed: 05/31/2021 1:36:15 PM By: Worthy Keeler PA-C Entered By: Worthy Keeler on 05/31/2021 13:36:15 [name] ([id number]) -------------------------------------------------------------------------------- SuperBill Details Patient Name: [name] Date of Service: 05/31/2021 Medical Record Number: [id number] Patient Account Number: [id number] Date of Birth/Sex: [birth year] (71 y.o. M) Treating RN: Donnamarie Poag Primary Care Provider: Felipa Eth Other Clinician: Referring Provider: Felipa Eth Treating Provider/Extender: Skipper Cliche in Treatment: 17 Diagnosis Coding ICD-10 Codes Code Description E11.622 Type 2 diabetes mellitus with other skin ulcer I89.0 Lymphedema, not elsewhere classified I87.2 Venous insufficiency (chronic) (peripheral) L97.822 Non-pressure chronic ulcer of other part of left lower leg with fat layer exposed L97.512 Non-pressure chronic ulcer of other part of right foot with fat layer exposed I10 Essential (primary) hypertension L97.811 Non-pressure chronic ulcer of other part of right lower leg limited to breakdown of skin Facility Procedures CPT4: Description Modifier Quantity Code 26203559 [id number] BILATERAL: Application of multi-layer venous compression system; leg (below knee), including 1 ankle and foot. Physician Procedures CPT4 Code: 8453646 Description: 80321 - WC PHYS LEVEL 4 -  EST PT Modifier: Quantity: 1 CPT4 Code: Description: ICD-10 Diagnosis Description E11.622 Type 2 diabetes mellitus with other skin ulcer I89.0 Lymphedema, not elsewhere classified I87.2 Venous insufficiency (chronic) (peripheral) L97.822 Non-pressure chronic ulcer of other part of left lower  leg with fat layer Modifier: exposed Quantity: Electronic Signature(s) Signed: 05/31/2021 1:36:31 PM By: Worthy Keeler PA-C Entered By: Worthy Keeler on 05/31/2021 13:36:30

## 2021-05-31 NOTE — Progress Notes (Addendum)
[name], [name] ([id number]) Visit Report for 05/31/2021 Arrival Information Details Patient Name: [name], [name] Date of Service: 05/31/2021 1:00 PM Medical Record Number: [id number] Patient Account Number: [id number] Date of Birth/Sex: 1950-05-28 (71 y.o. M) Treating RN: Donnamarie Poag Primary Care Girtha Kilgore: Felipa Eth Other Clinician: Referring Dexton Zwilling: Felipa Eth Treating Seth Higginbotham/Extender: Skipper Cliche in Treatment: 88 Visit Information History Since Last Visit Added or deleted any medications: No Patient Arrived: [name] Had a fall or experienced change in Yes Arrival Time: 13:01 activities of daily living that may affect Accompanied By: self risk of falls: Transfer Assistance: None Hospitalized since last visit: No Patient Identification Verified: Yes Has Dressing in Place as Prescribed: Yes Secondary Verification Process Completed: Yes Has Compression in Place as Prescribed: Yes Patient Requires Transmission-Based No Pain Present Now: No Precautions: Patient Has Alerts: Yes Patient Alerts: [facility] consult on file Last ABI-R 1.09; L 1.04 Electronic Signature(s) Signed: 05/31/2021 4:33:32 PM By: Donnamarie Poag Entered By: Donnamarie Poag on 05/31/2021 13:05:35 [name] ([id number]) -------------------------------------------------------------------------------- Compression Therapy Details Patient Name: [name] Date of Service: 05/31/2021 1:00 PM Medical Record Number: [id number] Patient Account Number: [id number] Date of Birth/Sex: 1950/10/02 (71 y.o. M) Treating RN: Donnamarie Poag Primary Care Nikkolas Coomes: Felipa Eth Other Clinician: Referring Charita Lindenberger: Felipa Eth Treating Margarit Minshall/Extender: Skipper Cliche in Treatment: 17 Compression Therapy Performed for Wound Assessment: Wound #17 Left,Anterior Lower Leg Performed By: Junius Argyle, RN Compression Type: Four Layer Post Procedure Diagnosis Same as Pre-procedure Electronic  Signature(s) Signed: 05/31/2021 4:33:32 PM By: Donnamarie Poag Entered By: Donnamarie Poag on 05/31/2021 13:30:51 [name] ([id number]) -------------------------------------------------------------------------------- Compression Therapy Details Patient Name: [name] Date of Service: 05/31/2021 1:00 PM Medical Record Number: [id number] Patient Account Number: [id number] Date of Birth/Sex: [birth year] (71 y.o. M) Treating RN: Donnamarie Poag Primary Care Brinkley Peet: Felipa Eth Other Clinician: Referring Vivan Agostino: Felipa Eth Treating Keatyn Luck/Extender: Skipper Cliche in Treatment: 17 Compression Therapy Performed for Wound Assessment: NonWound Condition Lymphedema - Right Leg Performed By: Clinician Donnamarie Poag, RN Compression Type: Four Layer Post Procedure Diagnosis Same as Pre-procedure Electronic Signature(s) Signed: 05/31/2021 4:33:32 PM By: Donnamarie Poag Entered By: Donnamarie Poag on 05/31/2021 13:31:07 [name] ([id number]) -------------------------------------------------------------------------------- Encounter Discharge Information Details Patient Name: [name] Date of Service: 05/31/2021 1:00 PM Medical Record Number: [id number] Patient Account Number: [id number] Date of Birth/Sex: 1951-03-24 (71 y.o. M) Treating RN: Donnamarie Poag Primary Care Lysander Calixte: Felipa Eth Other Clinician: Referring Birgit Nowling: Felipa Eth Treating Makita Blow/Extender: Skipper Cliche in Treatment: 17 Encounter Discharge Information Items Discharge Condition: Stable Ambulatory Status: Walker Discharge Destination: Home Transportation: Private Auto Accompanied By: self Schedule Follow-up Appointment: Yes Clinical Summary of Care: Electronic Signature(s) Signed: 05/31/2021 4:33:32 PM By: Donnamarie Poag Entered By: Donnamarie Poag on 05/31/2021 13:37:37 [name] ([id number]) -------------------------------------------------------------------------------- Lower Extremity  Assessment Details Patient Name: [name] Date of Service: 05/31/2021 1:00 PM Medical Record Number: [id number] Patient Account Number: [id number] Date of Birth/Sex: 06-09-1950 (71 y.o. M) Treating RN: Donnamarie Poag Primary Care Dorise Gangi: Felipa Eth Other Clinician: Referring Moises Terpstra: Felipa Eth Treating Icela Glymph/Extender: Jeri Cos Weeks in Treatment: 17 Edema Assessment Assessed: Shirlyn Goltz: Yes] Patrice Paradise: Yes] Edema: [Left: Yes] [Right: Yes] Calf Left: Right: Point of Measurement: 35 cm From Medial Instep 40 cm 40 cm Ankle Left: Right: Point of Measurement: 10 cm From Medial Instep 33 cm 31 cm Vascular Assessment Pulses: Dorsalis Pedis Palpable: [Left:No] [Right:No] Electronic Signature(s) Signed: 05/31/2021 4:33:32 PM By: Donnamarie Poag Entered By: Donnamarie Poag on 05/31/2021 13:21:31 [name] ([id number]) -------------------------------------------------------------------------------- Multi Wound Chart Details Patient Name: [name], [name]  Date of Service: 05/31/2021 1:00 PM Medical Record Number: [id number] Patient Account Number: [id number] Date of Birth/Sex: [birth year] (71 y.o. M) Treating RN: Donnamarie Poag Primary Care Carol Loftin: Felipa Eth Other Clinician: Referring Meria Crilly: Felipa Eth Treating Kajuana Shareef/Extender: Skipper Cliche in Treatment: 17 Vital Signs Height(in): 73 Pulse(bpm): 77 Weight(lbs): 312 Blood Pressure(mmHg): 136/76 Body Mass Index(BMI): 41.2 Temperature(F): 98.2 Respiratory Rate(breaths/min): 16 Photos: [N/A:N/A] Wound Location: Left, Anterior Lower Leg N/A N/A Wounding Event: Blister N/A N/A Primary Etiology: Lymphedema N/A N/A Comorbid History: Hypertension, Peripheral Venous N/A N/A Disease, Type II Diabetes, Neuropathy, Received Chemotherapy Date Acquired: 04/24/2021 N/A N/A Weeks of Treatment: 5 N/A N/A Wound Status: Open N/A N/A Wound Recurrence: No N/A N/A Measurements L x W x D (cm) 0.1x0.1x0.1 N/A N/A Area  (cm) : 0.008 N/A N/A Volume (cm) : 0.001 N/A N/A % Reduction in Area: 100.00% N/A N/A % Reduction in Volume: 100.00% N/A N/A Classification: Full Thickness Without Exposed N/A N/A Support Structures Exudate Amount: Large N/A N/A Exudate Type: Serous N/A N/A Exudate Color: amber N/A N/A Granulation Amount: Large (67-100%) N/A N/A Granulation Quality: Red N/A N/A Necrotic Amount: Small (1-33%) N/A N/A Exposed Structures: Fat Layer (Subcutaneous Tissue): N/A N/A Yes Fascia: No Tendon: No Muscle: No Joint: No Bone: No Epithelialization: None N/A N/A [name], [name] ([id number]) Treatment Notes Electronic Signature(s) Signed: 05/31/2021 4:33:32 PM By: Donnamarie Poag Entered By: Donnamarie Poag on 05/31/2021 13:30:33 [name] ([id number]) -------------------------------------------------------------------------------- Evangeline Details Patient Name: [name] Date of Service: 05/31/2021 1:00 PM Medical Record Number: [id number] Patient Account Number: [id number] Date of Birth/Sex: 11/27/1950 (71 y.o. M) Treating RN: Donnamarie Poag Primary Care Caitlynn Ju: Felipa Eth Other Clinician: Referring Rhilyn Battle: Felipa Eth Treating Tita Terhaar/Extender: Skipper Cliche in Treatment: 17 Active Inactive Electronic Signature(s) Signed: 05/31/2021 4:33:32 PM By: Donnamarie Poag Entered By: Donnamarie Poag on 05/31/2021 13:21:39 [name] ([id number]) -------------------------------------------------------------------------------- Non-Wound Condition Assessment Details Patient Name: [name] Date of Service: 05/31/2021 1:00 PM Medical Record Number: [id number] Patient Account Number: [id number] Date of Birth/Sex: 06-02-1950 (71 y.o. M) Treating RN: Donnamarie Poag Primary Care Atharv Barriere: Felipa Eth Other Clinician: Referring Elania Crowl: Felipa Eth Treating Reford Olliff/Extender: Jeri Cos Weeks in Treatment: 17 Non-Wound Condition: Condition:  Lymphedema Location: Leg Side: Right Photos Electronic Signature(s) Signed: 05/31/2021 4:33:32 PM By: Donnamarie Poag Entered By: Donnamarie Poag on 05/31/2021 13:20:22 [name] ([id number]) -------------------------------------------------------------------------------- Non-Wound Condition Assessment Details Patient Name: [name] Date of Service: 05/31/2021 1:00 PM Medical Record Number: [id number] Patient Account Number: [id number] Date of Birth/Sex: [birth year] (71 y.o. M) Treating RN: Donnamarie Poag Primary Care Clydine Parkison: Felipa Eth Other Clinician: Referring Makinsey Pepitone: Felipa Eth Treating Zykee Avakian/Extender: Jeri Cos Weeks in Treatment: 17 Non-Wound Condition: Condition: Lymphedema Location: Leg Side: Left Notes: Anterior aspect of lower leg Photos Electronic Signature(s) Signed: 05/31/2021 4:33:32 PM By: Donnamarie Poag Entered By: Donnamarie Poag on 05/31/2021 13:35:48 [name] ([id number]) -------------------------------------------------------------------------------- Pain Assessment Details Patient Name: [name] Date of Service: 05/31/2021 1:00 PM Medical Record Number: [id number] Patient Account Number: [id number] Date of Birth/Sex: 1951/04/22 (71 y.o. M) Treating RN: Donnamarie Poag Primary Care Karey Stucki: Felipa Eth Other Clinician: Referring Jasminemarie Sherrard: Felipa Eth Treating Toleen Lachapelle/Extender: Skipper Cliche in Treatment: 17 Active Problems Location of Pain Severity and Description of Pain Patient Has Paino No Site Locations Rate the pain. Current Pain Level: 0 Pain Management and Medication Current Pain Management: Electronic Signature(s) Signed: 05/31/2021 4:33:32 PM By: Donnamarie Poag Entered By: Donnamarie Poag on 05/31/2021 13:05:46 [name] ([id number]) -------------------------------------------------------------------------------- Patient/Caregiver Education Details Patient Name: [name] Date of Service: 05/31/2021  1:00  PM Medical Record Number: [id number] Patient Account Number: [id number] Date of Birth/Gender: 03-08-1951 (71 y.o. M) Treating RN: Donnamarie Poag Primary Care Physician: Felipa Eth Other Clinician: Referring Physician: Felipa Eth Treating Physician/Extender: Skipper Cliche in Treatment: 17 Education Assessment Education Provided To: Patient Education Topics Provided Basic Hygiene: Venous: Wound/Skin Impairment: Electronic Signature(s) Signed: 05/31/2021 4:33:32 PM By: Donnamarie Poag Entered By: Donnamarie Poag on 05/31/2021 13:34:21 [name] ([id number]) -------------------------------------------------------------------------------- Wound Assessment Details Patient Name: [name] Date of Service: 05/31/2021 1:00 PM Medical Record Number: [id number] Patient Account Number: [id number] Date of Birth/Sex: 10/15/1950 (71 y.o. M) Treating RN: Donnamarie Poag Primary Care Choice Kleinsasser: Felipa Eth Other Clinician: Referring Bently Wyss: Felipa Eth Treating Siera Beyersdorf/Extender: Skipper Cliche in Treatment: 17 Wound Status Wound Number: 17 Primary Lymphedema Etiology: Wound Location: Left, Anterior Lower Leg Wound Healed - Epithelialized Wounding Event: Blister Status: Date Acquired: 04/24/2021 Comorbid Hypertension, Peripheral Venous Disease, Type II Weeks Of Treatment: 5 History: Diabetes, Neuropathy, Received Chemotherapy Clustered Wound: No Photos Wound Measurements Length: (cm) 0 Width: (cm) 0 Depth: (cm) 0 Area: (cm) Volume: (cm) % Reduction in Area: 100% % Reduction in Volume: 100% Epithelialization: None 0 0 Wound Description Classification: Full Thickness Without Exposed Support Structu Exudate Amount: Large Exudate Type: Serous Exudate Color: amber res Foul Odor After Cleansing: No Slough/Fibrino Yes Wound Bed Granulation Amount: Large (67-100%) Exposed Structure Granulation Quality: Red Fascia Exposed: No Necrotic Amount: Small  (1-33%) Fat Layer (Subcutaneous Tissue) Exposed: Yes Tendon Exposed: No Muscle Exposed: No Joint Exposed: No Bone Exposed: No Treatment Notes Wound #17 (Lower Leg) Wound Laterality: Left, Anterior Cleanser Peri-Wound Care Topical Primary Dressing [name], [name] ([id number]) Secondary Dressing Secured With Compression Wrap Compression Stockings Add-Ons Electronic Signature(s) Signed: 05/31/2021 4:33:32 PM By: Donnamarie Poag Entered By: Donnamarie Poag on 05/31/2021 13:35:03 [name] ([id number]) -------------------------------------------------------------------------------- Vitals Details Patient Name: [name] Date of Service: 05/31/2021 1:00 PM Medical Record Number: [id number] Patient Account Number: [id number] Date of Birth/Sex: 11-22-1950 (71 y.o. M) Treating RN: Donnamarie Poag Primary Care Haizley Cannella: Felipa Eth Other Clinician: Referring Bryne Lindon: Felipa Eth Treating Jahmeir Geisen/Extender: Skipper Cliche in Treatment: 17 Vital Signs Time Taken: 13:05 Temperature (F): 98.2 Height (in): 73 Pulse (bpm): 77 Weight (lbs): 312 Respiratory Rate (breaths/min): 16 Body Mass Index (BMI): 41.2 Blood Pressure (mmHg): 136/76 Reference Range: 80 - 120 mg / dl Electronic Signature(s) Signed: 05/31/2021 4:33:32 PM By: Donnamarie Poag Entered ByDonnamarie Poag on 05/31/2021 13:05:19

## 2021-06-02 ENCOUNTER — Encounter: Payer: Self-pay | Admitting: Medical

## 2021-06-02 ENCOUNTER — Other Ambulatory Visit: Payer: Self-pay

## 2021-06-02 ENCOUNTER — Ambulatory Visit (INDEPENDENT_AMBULATORY_CARE_PROVIDER_SITE_OTHER): Payer: PPO | Admitting: Medical

## 2021-06-02 VITALS — BP 110/70 | HR 75 | Ht 73.0 in | Wt 300.0 lb

## 2021-06-02 DIAGNOSIS — R600 Localized edema: Secondary | ICD-10-CM | POA: Diagnosis not present

## 2021-06-02 DIAGNOSIS — I5032 Chronic diastolic (congestive) heart failure: Secondary | ICD-10-CM | POA: Diagnosis not present

## 2021-06-02 DIAGNOSIS — I890 Lymphedema, not elsewhere classified: Secondary | ICD-10-CM | POA: Diagnosis not present

## 2021-06-02 DIAGNOSIS — N183 Chronic kidney disease, stage 3 unspecified: Secondary | ICD-10-CM

## 2021-06-02 DIAGNOSIS — I10 Essential (primary) hypertension: Secondary | ICD-10-CM

## 2021-06-02 NOTE — Patient Instructions (Signed)
Medication Instructions:   Your physician recommends that you continue on your current medications as directed. Please refer to the Current Medication list given to you today.  *If you need a refill on your cardiac medications before your next appointment, please call your pharmacy*   Lab Work:  BMET today  If you have labs (blood work) drawn today and your tests are completely normal, you will receive your results only by: [place] (if you have MyChart) OR A paper copy in the mail If you have any lab test that is abnormal or we need to change your treatment, we will call you to review the results.   Testing/Procedures:  None ordered   Follow-Up: At [facility], you and your health needs are our priority.  As part of our continuing mission to provide you with exceptional heart care, we have created designated Provider Care Teams.  These Care Teams include your primary Cardiologist (physician) and Advanced Practice Providers (APPs -  Physician Assistants and Nurse Practitioners) who all work together to provide you with the care you need, when you need it.  We recommend signing up for the patient portal called "MyChart".  Sign up information is provided on this After Visit Summary.  MyChart is used to connect with patients for Virtual Visits (Telemedicine).  Patients are able to view lab/test results, encounter notes, upcoming appointments, etc.  Non-urgent messages can be sent to your provider as well.   To learn more about what you can do with MyChart, go to [email or web address].    Your next appointment:   2 month(s)  The format for your next appointment:   In Person  Provider:   You may see Dr. Rockey Situ or one of the following Advanced Practice Providers on your designated Care Team:   Murray Hodgkins, NP Christell Faith, PA-C Cadence Kathlen Mody, [place]

## 2021-06-02 NOTE — Progress Notes (Signed)
Cardiology Office Note:    Date:  06/02/2021   ID:  [name], DOB 1950-12-07, MRN [id number]  PCP:  Felipa Eth, MD  [facility] Cardiologist:  None  [facility] Electrophysiologist:  None   Referring MD: Felipa Eth, MD   Chief Complaint: f/u repeat BMET  History of Present Illness:    [name] is a 71 y.o. male with a hx of lymphedema, PVD with venous ulcer follows by the wound clinic and vascular surgery, general debility, CKD stage 3, HFpEF, HLD who presents for follow-up.   He was being followed by [facility] for lymphedema,venous ulcer left lower extremity.  Seen 12/01/20 in the cardiology office, referred for lymphedema. No prior cardiac issues. Echo was ordered.   Echo showed LVEF 55-60%, G2DD, no WMA, mild LVH, mildly dilated.   Patient was started on lasix 20mg  daily at discharge in July 2022.   He was admitted end of December 2022 for leg wounds. He was treated with IV abx. Blood cultures were negative. DVT dopplers negative. Vascular consulted and suggested local wound care. Sent home on oral abx. He also received IV lasix in the hospital, sent home on lasix 40mg  daily.   [facility] Creat[facility] to 1.5 and lasix 40 was continued. He sees nephrology March 10th.   Today, the patient is doing better. He uses a cane at home. No chest pain, shortness of breath. He is following with the wound clinic, legs are wrapped today. He is no longer on oral antibiotics. On exam trace edema felt on the legs, hard to see since they are wrapped. No fever, chills, orthopnea, pnd. BP good.   Past Medical History:  Diagnosis Date   Diabetes mellitus without complication ([place])    Hyperlipidemia    Hypertension    Leukemia ([place])     Past Surgical History:  Procedure Laterality Date   CATARACT EXTRACTION Right    TONSILLECTOMY     TOOTH EXTRACTION      Current Medications: Current Meds  Medication Sig   aspirin 81 MG chewable tablet Chew 81 mg by  mouth daily.   bisoprolol (ZEBETA) 5 MG tablet Take 1 tablet by mouth daily.   Cholecalciferol 25 MCG (1000 UT) tablet Take 2 tablets by mouth daily.   Ensure Max Protein (ENSURE MAX PROTEIN) LIQD Take 330 mLs (11 oz total) by mouth 2 (two) times daily.   ferrous sulfate 325 (65 FE) MG EC tablet Take 325 mg by mouth daily.   furosemide (LASIX) 20 MG tablet Take 2 tablets (40 mg total) by mouth daily.   glimepiride (AMARYL) 1 MG tablet Take 1 tablet by mouth daily.   hydrALAZINE (APRESOLINE) 25 MG tablet Take 75 mg by mouth in the morning, at noon, and at bedtime.   lisinopril (ZESTRIL) 40 MG tablet TAKE ONE TABLET BY MOUTH EVERY DAY FOR BLOOD PRESSURE **REPLACES LISINOPRIL/HCTZ   Multiple Vitamin (MULTIVITAMIN WITH MINERALS) TABS tablet Take 1 tablet by mouth daily.   nystatin (MYCOSTATIN/NYSTOP) powder Apply to toe crevices with unna wrap changes   Omega-3 Fatty Acids (FISH OIL) 1000 MG CAPS Take 1 capsule by mouth daily.   pioglitazone (ACTOS) 15 MG tablet Take 15 mg by mouth daily.   pravastatin (PRAVACHOL) 80 MG tablet Take 40 mg by mouth daily. daily   sertraline (ZOLOFT) 100 MG tablet Take 200 mg by mouth daily.   silver sulfADIAZINE (SILVADENE) 1 % cream Apply to left ankle with unna wrap changes  Allergies:   Patient has no known allergies.   Social History   Socioeconomic History   Marital status: Divorced    Spouse name: Not on file   Number of children: Not on file   Years of education: Not on file   Highest education level: Not on file  Occupational History   Not on file  Tobacco Use   Smoking status: Former   Smokeless tobacco: Never  Vaping Use   Vaping Use: Never used  Substance and Sexual Activity   Alcohol use: No   Drug use: No   Sexual activity: Not on file  Other Topics Concern   Not on file  Social History Narrative   Not on file   Social Determinants of Health   Financial Resource Strain: Not on file  Food Insecurity: Not on file   Transportation Needs: Not on file  Physical Activity: Not on file  Stress: Not on file  Social Connections: Not on file     Family History: The patient's family history includes Cancer in his mother and sister; Stroke in his father.  ROS:   Please see the history of present illness.     All other systems reviewed and are negative.  EKGs/Labs/Other Studies Reviewed:    The following studies were reviewed today:  Echo 04/14/21 1. Left ventricular ejection fraction, by estimation, is 55 to 60%. The  left ventricle has normal function. The left ventricle has no regional  wall motion abnormalities. There is mild left ventricular hypertrophy.  Left ventricular diastolic parameters  are consistent with Grade II diastolic dysfunction (pseudonormalization).   2. Right ventricular systolic function is normal. The right ventricular  size is not well visualized.   3. Left atrial size was mildly dilated.   4. The mitral valve is normal in structure. Trivial mitral valve  regurgitation.   5. The aortic valve is tricuspid. Aortic valve regurgitation is not  visualized.   EKG:  EKG is not ordered today.   Recent Labs: 04/26/2021: ALT 21; B Natriuretic Peptide 92.0; Hemoglobin 8.8; Platelets 204 04/27/2021: Magnesium 1.8 05/04/2021: BUN 38; Creatinine, Ser 1.59; Potassium 4.3; Sodium 144  Recent Lipid Panel No results found for: CHOL, TRIG, HDL, CHOLHDL, VLDL, LDLCALC, LDLDIRECT     Physical Exam:    VS:  BP 110/70 (BP Location: Right Arm, Patient Position: Sitting, Cuff Size: Large)    Pulse 75    Ht 6\' 1"  (1.854 m)    Wt 300 lb (136.1 kg)    SpO2 98%    BMI 39.58 kg/m     Wt Readings from Last 3 Encounters:  06/02/21 300 lb (136.1 kg)  04/26/21 (!) 305 lb (138.3 kg)  01/14/21 (!) 314 lb (142.4 kg)     GEN:  Well nourished, well developed in no acute distress HEENT: Normal NECK: No JVD; No carotid bruits LYMPHATICS: No lymphadenopathy CARDIAC: RRR, no murmurs, rubs,  gallops RESPIRATORY:  Clear to auscultation without rales, wheezing or rhonchi  ABDOMEN: Soft, non-tender, non-distended MUSCULOSKELETAL:  Trace lower leg edema; No deformity  SKIN: Warm and dry NEUROLOGIC:  Alert and oriented x 3 PSYCHIATRIC:  Normal affect   ASSESSMENT:    1. Chronic diastolic CHF (congestive heart failure) ([place])   2. Essential hypertension   3. Lymphedema   4. Lower extremity edema   5. Morbid obesity ([place])   6. Stage 3 chronic kidney disease, unspecified whether stage 3a or 3b CKD ([place])    PLAN:    In order of  problems listed above:  Lower leg edema Lymphedema HFpEF LLE edema multifactorial. Recent admission for lower leg cellulitis, IV diuresis, sent home on oral abx and lasix 40mg  daily. Reported follow-up labs at the [facility] showed Scr of 1.5, which is stable from discharge. Some LLE due to G2DD. Today he has trace edema on exam, although difficult to tell because legs are wrapped. He will continue to follow with the wound clinic. Continue bisoprolol and lisinopril. I will check a BMET today. Continue with current lasix dose.   HTN BP good today, continue current medications.   HLD Continue Pravastatin.   CKD BMET today. He has a visit with nephrology yin March.   Disposition: Follow up in 2 month(s) with MDD/APP     Signed, Terence Googe Ninfa Meeker, PA-C  06/02/2021 3:56 PM    [facility]

## 2021-06-03 ENCOUNTER — Ambulatory Visit: Payer: PPO | Admitting: Podiatry

## 2021-06-03 LAB — BASIC METABOLIC PANEL
BUN/Creatinine Ratio: 25 — ABNORMAL HIGH (ref 10–24)
BUN: 34 mg/dL — ABNORMAL HIGH (ref 8–27)
CO2: 20 mmol/L (ref 20–29)
Calcium: 9.4 mg/dL (ref 8.6–10.2)
Chloride: 102 mmol/L (ref 96–106)
Creatinine, Ser: 1.35 mg/dL — ABNORMAL HIGH (ref 0.76–1.27)
Glucose: 93 mg/dL (ref 70–99)
Potassium: 4.7 mmol/L (ref 3.5–5.2)
Sodium: 145 mmol/L — ABNORMAL HIGH (ref 134–144)
eGFR: 56 mL/min/{1.73_m2} — ABNORMAL LOW (ref >59)

## 2021-06-07 ENCOUNTER — Encounter: Payer: PPO | Admitting: Physician Assistant

## 2021-06-07 ENCOUNTER — Other Ambulatory Visit: Payer: Self-pay

## 2021-06-07 DIAGNOSIS — E11622 Type 2 diabetes mellitus with other skin ulcer: Secondary | ICD-10-CM | POA: Diagnosis not present

## 2021-06-07 NOTE — Progress Notes (Signed)
NACHMEN, MANSEL ([id number]) Visit Report for 06/07/2021 Arrival Information Details Patient Name: [name], [name] Date of Service: 06/07/2021 1:00 PM Medical Record Number: [id number] Patient Account Number: [id number] Date of Birth/Sex: 1951/02/20 (71 y.o. M) Treating RN: Donnamarie Poag Primary Care Terita Hejl: Felipa Eth Other Clinician: Referring Angeliah Wisdom: Felipa Eth Treating Juliano Mceachin/Extender: Skipper Cliche in Treatment: 18 Visit Information History Since Last Visit Added or deleted any medications: No Patient Arrived: [name] Had a fall or experienced change in No Arrival Time: 13:05 activities of daily living that may affect Accompanied By: self risk of falls: Transfer Assistance: None Hospitalized since last visit: No Patient Identification Verified: Yes Has Dressing in Place as Prescribed: Yes Secondary Verification Process Completed: Yes Has Compression in Place as Prescribed: Yes Patient Requires Transmission-Based No Pain Present Now: No Precautions: Patient Has Alerts: Yes Patient Alerts: [facility] consult on file Last ABI-R 1.09; L 1.04 Electronic Signature(s) Signed: 06/07/2021 3:55:39 PM By: Donnamarie Poag Entered By: Donnamarie Poag on 06/07/2021 13:12:30 [name] ([id number]) -------------------------------------------------------------------------------- Compression Therapy Details Patient Name: [name] Date of Service: 06/07/2021 1:00 PM Medical Record Number: [id number] Patient Account Number: [id number] Date of Birth/Sex: Apr 13, 1951 (71 y.o. M) Treating RN: Donnamarie Poag Primary Care Sameria Morss: Felipa Eth Other Clinician: Referring Julisa Flippo: Felipa Eth Treating Perris Tripathi/Extender: Skipper Cliche in Treatment: 18 Compression Therapy Performed for Wound Assessment: Wound #18 Left,Dorsal Foot Performed By: Junius Argyle, RN Compression Type: Four Layer Post Procedure Diagnosis Same as Pre-procedure Electronic Signature(s) Signed:  06/07/2021 3:55:39 PM By: Donnamarie Poag Entered By: Donnamarie Poag on 06/07/2021 13:57:45 [name] ([id number]) -------------------------------------------------------------------------------- Compression Therapy Details Patient Name: [name] Date of Service: 06/07/2021 1:00 PM Medical Record Number: [id number] Patient Account Number: [id number] Date of Birth/Sex: 07/21/1950 (71 y.o. M) Treating RN: Donnamarie Poag Primary Care Christinea Brizuela: Felipa Eth Other Clinician: Referring Derald Lorge: Felipa Eth Treating Jaymien Landin/Extender: Skipper Cliche in Treatment: 18 Compression Therapy Performed for Wound Assessment: NonWound Condition Lymphedema - Right Leg Performed By: Junius Argyle, RN Compression Type: Four Layer Post Procedure Diagnosis Same as Pre-procedure Electronic Signature(s) Signed: 06/07/2021 3:55:39 PM By: Donnamarie Poag Entered By: Donnamarie Poag on 06/07/2021 13:58:00 [name] ([id number]) -------------------------------------------------------------------------------- Encounter Discharge Information Details Patient Name: [name] Date of Service: 06/07/2021 1:00 PM Medical Record Number: [id number] Patient Account Number: [id number] Date of Birth/Sex: 19-Jul-1950 (71 y.o. M) Treating RN: Donnamarie Poag Primary Care Geralyn Figiel: Felipa Eth Other Clinician: Referring Danil Wedge: Felipa Eth Treating Faust Thorington/Extender: Skipper Cliche in Treatment: 18 Encounter Discharge Information Items Discharge Condition: Stable Ambulatory Status: Cane Discharge Destination: Home Transportation: Private Auto Accompanied By: self Schedule Follow-up Appointment: Yes Clinical Summary of Care: Electronic Signature(s) Signed: 06/07/2021 3:55:39 PM By: Donnamarie Poag Entered By: Donnamarie Poag on 06/07/2021 14:15:28 [name] ([id number]) -------------------------------------------------------------------------------- Lower Extremity Assessment Details Patient Name:  [name] Date of Service: 06/07/2021 1:00 PM Medical Record Number: [id number] Patient Account Number: [id number] Date of Birth/Sex: [birth year] (71 y.o. M) Treating RN: Donnamarie Poag Primary Care Blakely Maranan: Felipa Eth Other Clinician: Referring Najat Olazabal: Felipa Eth Treating Lennon Boutwell/Extender: Jeri Cos Weeks in Treatment: 18 Edema Assessment Assessed: [Left: Yes] Patrice Paradise: Yes] Edema: [Left: Yes] [Right: Yes] Calf Left: Right: Point of Measurement: 35 cm From Medial Instep 40 cm 40 cm Ankle Left: Right: Point of Measurement: 10 cm From Medial Instep 38 cm 30 cm Vascular Assessment Pulses: Dorsalis Pedis Palpable: [Left:No] [Right:No] Electronic Signature(s) Signed: 06/07/2021 3:55:39 PM By: Donnamarie Poag Entered By: Donnamarie Poag on 06/07/2021 13:23:37 [name] ([id number]) -------------------------------------------------------------------------------- Multi Wound Chart Details Patient Name: [name] Date  of Service: 06/07/2021 1:00 PM Medical Record Number: [id number] Patient Account Number: [id number] Date of Birth/Sex: [birth year] (71 y.o. M) Treating RN: Donnamarie Poag Primary Care Carlon Davidson: Felipa Eth Other Clinician: Referring Andrina Locken: Felipa Eth Treating Kaeya Schiffer/Extender: Skipper Cliche in Treatment: 18 Vital Signs Height(in): 73 Pulse(bpm): 61 Weight(lbs): 312 Blood Pressure(mmHg): 129/71 Body Mass Index(BMI): 41.2 Temperature(F): 98.1 Respiratory Rate(breaths/min): 16 Wound Assessments Treatment Notes Electronic Signature(s) Signed: 06/07/2021 3:55:39 PM By: Donnamarie Poag Entered By: Donnamarie Poag on 06/07/2021 13:54:50 [name] ([id number]) -------------------------------------------------------------------------------- Amorita Details Patient Name: [name] Date of Service: 06/07/2021 1:00 PM Medical Record Number: [id number] Patient Account Number: [id number] Date of Birth/Sex: 10-07-1950 (71 y.o.  M) Treating RN: Donnamarie Poag Primary Care Alaynna Kerwood: Felipa Eth Other Clinician: Referring Rafael Quesada: Felipa Eth Treating Zniya Cottone/Extender: Jeri Cos Weeks in Treatment: 18 Active Inactive Electronic Signature(s) Signed: 06/07/2021 3:55:39 PM By: Donnamarie Poag Entered By: Donnamarie Poag on 06/07/2021 13:54:40 [name] ([id number]) -------------------------------------------------------------------------------- Non-Wound Condition Assessment Details Patient Name: [name] Date of Service: 06/07/2021 1:00 PM Medical Record Number: [id number] Patient Account Number: [id number] Date of Birth/Sex: [birth year] (71 y.o. M) Treating RN: Donnamarie Poag Primary Care Daniqua Campoy: Felipa Eth Other Clinician: Referring Aiven Kampe: Felipa Eth Treating Ruxin Ransome/Extender: Jeri Cos Weeks in Treatment: 18 Non-Wound Condition: Condition: Lymphedema Location: Leg Side: Left Notes: Anterior aspect of lower leg Photos Electronic Signature(s) Signed: 06/07/2021 3:55:39 PM By: Donnamarie Poag Entered By: Donnamarie Poag on 06/07/2021 13:24:03 [name] ([id number]) -------------------------------------------------------------------------------- Non-Wound Condition Assessment Details Patient Name: [name] Date of Service: 06/07/2021 1:00 PM Medical Record Number: [id number] Patient Account Number: [id number] Date of Birth/Sex: December 17, 1950 (71 y.o. M) Treating RN: Donnamarie Poag Primary Care Akemi Overholser: Felipa Eth Other Clinician: Referring Marylouise Mallet: Felipa Eth Treating Wasil Wolke/Extender: Jeri Cos Weeks in Treatment: 18 Non-Wound Condition: Condition: Lymphedema Location: Leg Side: Right Photos Electronic Signature(s) Signed: 06/07/2021 3:55:39 PM By: Donnamarie Poag Entered By: Donnamarie Poag on 06/07/2021 13:24:49 [name] ([id number]) -------------------------------------------------------------------------------- Pain Assessment Details Patient Name: [name] Date of Service: 06/07/2021 1:00 PM Medical Record Number: [id number] Patient Account Number: [id number] Date of Birth/Sex: May 11, 1950 (71 y.o. M) Treating RN: Donnamarie Poag Primary Care Kodee Drury: Felipa Eth Other Clinician: Referring Sheanna Dail: Felipa Eth Treating Toini Failla/Extender: Skipper Cliche in Treatment: 18 Active Problems Location of Pain Severity and Description of Pain Patient Has Paino No Site Locations Rate the pain. Current Pain Level: 0 Pain Management and Medication Current Pain Management: Electronic Signature(s) Signed: 06/07/2021 3:55:39 PM By: Donnamarie Poag Entered By: Donnamarie Poag on 06/07/2021 13:14:37 [name] ([id number]) -------------------------------------------------------------------------------- Patient/Caregiver Education Details Patient Name: [name] Date of Service: 06/07/2021 1:00 PM Medical Record Number: [id number] Patient Account Number: [id number] Date of Birth/Gender: [birth year] (71 y.o. M) Treating RN: Donnamarie Poag Primary Care Physician: Felipa Eth Other Clinician: Referring Physician: Felipa Eth Treating Physician/Extender: Skipper Cliche in Treatment: 18 Education Assessment Education Provided To: Patient and Caregiver Education Topics Provided Basic Hygiene: Elevated Blood Sugar/ Impact on Healing: Venous: Wound/Skin Impairment: Electronic Signature(s) Signed: 06/07/2021 3:55:39 PM By: Donnamarie Poag Entered By: Donnamarie Poag on 06/07/2021 14:00:23 [name] ([id number]) -------------------------------------------------------------------------------- Wound Assessment Details Patient Name: [name] Date of Service: 06/07/2021 1:00 PM Medical Record Number: [id number] Patient Account Number: [id number] Date of Birth/Sex: 07/17/1950 (71 y.o. M) Treating RN: Donnamarie Poag Primary Care Darletta Noblett: Felipa Eth Other Clinician: Referring Gelila Well: Felipa Eth Treating Jasia Hiltunen/Extender:  Jeri Cos Weeks in Treatment: 18 Wound Status Wound Number: 18 Primary Lymphedema Etiology: Wound Location: Left, Dorsal Foot Secondary Diabetic Wound/Ulcer of the Lower Extremity Wounding Event: Gradually  Appeared Etiology: Date Acquired: 06/07/2021 Wound Status: Open Weeks Of Treatment: 0 Comorbid Hypertension, Peripheral Venous Disease, Type II Clustered Wound: No History: Diabetes, Neuropathy, Received Chemotherapy Photos Wound Measurements Length: (cm) 2.1 Width: (cm) 3.5 Depth: (cm) 0.1 Area: (cm) 5.773 Volume: (cm) 0.577 % Reduction in Area: % Reduction in Volume: Tunneling: No Undermining: No Wound Description Classification: Full Thickness Without Exposed Support Structures Exudate Amount: Medium Exudate Type: Serosanguineous Exudate Color: red, brown Foul Odor After Cleansing: No Slough/Fibrino No Wound Bed Granulation Amount: Large (67-100%) Exposed Structure Granulation Quality: Red Fascia Exposed: No Necrotic Amount: None Present (0%) Fat Layer (Subcutaneous Tissue) Exposed: Yes Tendon Exposed: No Muscle Exposed: No Joint Exposed: No Bone Exposed: No Treatment Notes Wound #18 (Foot) Wound Laterality: Dorsal, Left Cleanser Normal Saline Discharge Instruction: Wash your hands with soap and water. Remove old dressing, discard into plastic bag and place into trash. Cleanse the wound with Normal Saline prior to applying a clean dressing using gauze sponges, not tissues or cotton balls. Do not scrub or use excessive force. Pat dry using gauze sponges, not tissue or cotton balls. SAYVION, VIGEN ([id number]) Soap and Water Discharge Instruction: Gently cleanse wound with antibacterial soap, rinse and pat dry prior to dressing wounds Wound Cleanser Discharge Instruction: Wash your hands with soap and water. Remove old dressing, discard into plastic bag and place into trash. Cleanse the wound with Wound Cleanser prior to applying a clean dressing using gauze  sponges, not tissues or cotton balls. Do not scrub or use excessive force. Pat dry using gauze sponges, not tissue or cotton balls. Peri-Wound Care Topical Primary Dressing Silvercel 4 1/4x 4 1/4 (in/in) Discharge Instruction: Apply Silvercel 4 1/4x 4 1/4 (in/in) as instructed Secondary Dressing ABD Pad 5x9 (in/in) Discharge Instruction: Cover with ABD pad Secured With Compression Wrap Medichoice 4 layer Compression System, 35-40 mmHG Discharge Instruction: Apply multi-layer wrap as directed. Compression Stockings Add-Ons Electronic Signature(s) Signed: 06/07/2021 3:55:39 PM By: Donnamarie Poag Entered By: Donnamarie Poag on 06/07/2021 13:57:12 [name] ([id number]) -------------------------------------------------------------------------------- Vitals Details Patient Name: [name] Date of Service: 06/07/2021 1:00 PM Medical Record Number: [id number] Patient Account Number: [id number] Date of Birth/Sex: 1950-08-23 (70 y.o. M) Treating RN: Donnamarie Poag Primary Care Yanitza Shvartsman: Felipa Eth Other Clinician: Referring Hattie Aguinaldo: Felipa Eth Treating Golda Zavalza/Extender: Skipper Cliche in Treatment: 18 Vital Signs Time Taken: 13:12 Temperature (F): 98.1 Height (in): 73 Pulse (bpm): 61 Weight (lbs): 312 Respiratory Rate (breaths/min): 16 Body Mass Index (BMI): 41.2 Blood Pressure (mmHg): 129/71 Reference Range: 80 - 120 mg / dl Electronic Signature(s) Signed: 06/07/2021 3:55:39 PM By: Donnamarie Poag Entered ByDonnamarie Poag on 06/07/2021 13:14:28

## 2021-06-07 NOTE — Progress Notes (Addendum)
[name], [name] ([id number]) Visit Report for 06/07/2021 Chief Complaint Document Details Patient Name: [name], [name] Date of Service: 06/07/2021 1:00 PM Medical Record Number: [id number] Patient Account Number: [id number] Date of Birth/Sex: 1950/05/30 (71 y.o. M) Treating RN: Donnamarie Poag Primary Care Provider: Felipa Eth Other Clinician: Referring Provider: Felipa Eth Treating Provider/Extender: Skipper Cliche in Treatment: 18 Information Obtained from: Patient Chief Complaint Left LE ulcers Electronic Signature(s) Signed: 06/07/2021 1:24:05 PM By: Worthy Keeler PA-C Entered By: Worthy Keeler on 06/07/2021 13:24:05 [name] ([id number]) -------------------------------------------------------------------------------- HPI Details Patient Name: [name] Date of Service: 06/07/2021 1:00 PM Medical Record Number: [id number] Patient Account Number: [id number] Date of Birth/Sex: 1951-03-19 (71 y.o. M) Treating RN: Donnamarie Poag Primary Care Provider: Felipa Eth Other Clinician: Referring Provider: Felipa Eth Treating Provider/Extender: Skipper Cliche in Treatment: 18 History of Present Illness HPI Description: 71 year old male who presented to the ER with bilateral lower extremity blisters which had started last week. he has a past medical history of leukemia, diabetes mellitus, hypertension, edema of both lower extremities, his recurrent skin infections, peripheral vascular disease, coronary artery disease, congestive heart failure and peripheral neuropathy. in the ER he was given Rocephin and put on Silvadene cream. he was put on oral doxycycline and was asked to follow-up with the [facility]. His last hemoglobin A1c was 6.6 in December and he checks his blood sugar once a week. He does not have any physicians outside the [place] system. He does not recall any vascular duplex studies done either for arterial or venous disease but was told to wear compression  stockings which he does not use 05/30/2016 -- we have not yet received any of his notes from the [facility] hospital system and his arterial and venous duplex studies are scheduled here in [place] around mid February. We are unable to have his insurance accepted by home health agencies and hence he is getting dressings only once a week. 06/06/16 -- -- I received a call from the patient's PCP at the [facility] at [facility] and spoke to Dr. Garvin Fila, phone number [phone] and fax number [phone]. She confirmed that no vascular testing was done over the last 5 years and she would be happy to do them if the patient did want them to be done at the [place] and we could fax him a request. Readmission: 71 year old male seen by as in February of this year and was referred to vein and vascular for studies and opinion from the vascular surgeons. The patient returns today with a fresh problem having had blisters on his left lower extremity which have been there for about 5 days and he clearly states that he has been wearing his compression stockings as advised though he could not read the moderate compression and has been wearing light compression. Review of his electronic medical records note that he had lower extremity arterial duplex examination done on 06/23/2016 which showed no hemodynamically significant stenosis in the bilateral lower extremity arterial system. He also had a lower extremity venous reflux examination done on 07/07/2016 and it was noted that he had venous incompetence in the right great saphenous vein and bilateral common femoral veins. Patient was seen by Dr. Tamala Julian on the same day and for some reason his notes do not reflect the venous studies or the arterial studies and he recommended patient do a venous duplex ultrasound to look for reflux and return to see him.he would also consider a lymph pump if required. The patient was told that his workup  was normal and hence the patient canceled his  follow-up appointment. 02/03/17 on evaluation today patient left medial lower extremity blister appears to be doing about the same. It is still continuing to drain and there's still the blistered skin covering the wound bed which is making it difficult for the alternate to do its job. Fortunately there is no evidence of cellulitis. No fevers chills noted. Patient states in general he is not having any significant discomfort. Patient's lower extremity arterial duplex exam revealed that patient was hemodynamically stable with no evidence of stenosis in regard to the bilateral lower extremities. The lower extremity venous reflux exam revealed the patient had venous incontinence noted in the right greater saphenous and bilateral common femoral vein. There is no evidence of deep or superficial vein thrombosis in the bilateral lower extremities. Readmission: 11/12/18 Patient presents for evaluation our clinic today concerning issues that he is having with his left lower extremity. He tells me that a couple weeks ago he began developing blisters on the left lower extremity along with increased swelling. He typically wears his compression stockings on a regular basis is previously been evaluated both here as well is with vascular surgery they would recommend lymphedema pumps but unfortunately that somehow fell through and he never heard anything back from that. Nonetheless I think lymphedema pumps would be beneficial for this patient. He does have a history of hypertension and diabetes. Obviously the chronic venous stasis and lymphedema as well. At this point the blisters have been given in more trouble he states sometimes when the blisters openings able to clean it down with alcohol and it will dry out and do well. Unfortunately that has not been the case this time. He is having some discomfort although this mean these with cleaning the areas he doesn't have discomfort just on a regular basis. He has not been  able to wear his compression stockings since the blisters arose due to the fact that of course it will drain into the socks causing additional issues and he didn't have any way to wrap this otherwise. He has increased to taking his Lasix every day instead of every other day. He sees his primary care provider later this month as well. No fevers, chills, nausea, or vomiting noted at this time. 11/19/18-Patient returns at 1 week, per intake RN the amount of seepage into the compression wraps was definitely improved, overall all the wounds are measuring smaller but continuing silver alginate to the wounds as primary dressing 11/26/18 on evaluation today patient appears to be doing quite well in regard to his left lower [place] ulcers. In fact of the areas that were noted initially he only has two regions still open. There is no evidence of active infection at this time. He still is not heard anything from the company regarding lymphedema pumps as of yet. Again as previously seen vascular they have not recommended any surgical intervention. 12/03/2018 on evaluation today patient actually appears to be doing quite well with regard to his lower extremity ulcers. In fact most of the areas appear to be healed the one spot which does not seem to be completely healed I am unsure of whether or not this is really draining that much but nonetheless there does not appear to be any signs of infection or significant drainage at this point. There is no sign of fever, chills, nausea, vomiting, or diarrhea. Overall I am pleased with how things have progressed I think is very close to being able to transition  to his home compression stockings. [name], [name] ([id number]) 12/10/2018 upon evaluation today patient appears to be doing quite well with regard to his left lower extremity. He has been tolerating the dressing changes without complication. Fortunately there is no signs of active infection at this time. He appears after  thorough evaluation of his leg to only have 1 small area that remains open at this point everything else appears to be almost completely closed. He still have significant swelling of the left lower extremity. We had discussed discussing this with his primary care provider he is not able to see her in person they were at the [facility] and right now the [place] is not seeing patients on site. According to the patient anyway. Subsequently he did speak with her apparently and his primary care provider feels that he may likely have a DVT. With that being said she has not seen his leg she is just going off of his history. Nonetheless that is a concern that the patient now has as well and while I do not feel the DVT is likely we can definitely ensure that that is not the case I will go ahead and see about putting that order in today. Nonetheless otherwise I am in a recommend that we continue with the current wound care measures including the compression therapy most likely. We just need to ensure that his leg is indeed free of any DVTs. 12/17/2018 on evaluation today patient actually appears to be completely healed today. He does have 2 very small areas of blistering although this is not anything too significant at this point which is good news. With that being said I am in agreement with the fact that I think he is completely healed at this point. He does want to get back into his compression stocking. The good news is we have gotten approval from insurance for his lymphedema pumps we received a letter since last saw him last week. The other good news is his study did come back and showed no evidence of a DVT. 12/20/2018 on evaluation today patient presents for follow-up concerning his ongoing issues with his left lower extremity. He was actually discharged last Friday and did fairly well until he states blisters opened this morning. He tells me he has been wearing his compression stocking although he has a hard  time getting this on. There does not appear to be any signs of active infection at this time. No fevers, chills, nausea, vomiting, or diarrhea. 12/27/2018 on evaluation today patient appears to be doing very well with regard to his swelling of the left lower extremity the 4 layer compression wrap seems to have been beneficial for him. Fortunately there is no signs of active infection at this time. Patient has been tolerating the compression wrap without complication and his foot swelling in particular appears to be greatly improved. He does still have a wound on the lateral portion of his left leg I believe this is more of a blister that has now reopened. 01/03/2019 on evaluation today patient actually appears to be doing excellent in regard to his left lower extremity. He did receive his compression pumps and is actually use this 7 times since he was last here in the office. On top of the compression wrap he is now roughly 3 cm better at the calf and 2 cm better at the ankle he also states that his foot seem to go an issue better without even having to use a shoe horn. Obviously I  think this is all evidence that he is doing excellent in this regard. The other good news is he does not appear to have anything open today as far as wounds are concerned. 01/15/2019 on evaluation today patient appears to be doing more poorly yet again with regard to his left lower extremity. He has developed new wounds again after being discharged just recently. Unfortunately this continues to be the case that he will heal and then have subsequent new wounds. The last time I was hopeful that he may not end up coming back too quickly especially since he states he has been using his lymphedema pumps along with wearing his compression. Nonetheless he had a blister on the back of his leg that popped up on the left and this has opened up into an ulceration it is quite painful. 01/22/19 on evaluation today patient actually appears  to be doing well with regard to his wound on the left lower extremity. He's been tolerating the dressing changes without complication including the compression wrap in the wound appears to be significantly smaller today which is great news. Overall very pleased in this regard. 01/29/2019 on evaluation today patient appears to be doing well with regard to his left posterior lower extremity ulcer. He has been tolerating the dressing changes without complication. This is not completely healed but is getting much closer. We did order a Farrow wrap 4000 for him he has received this and has it with him today although I am not sure we are quite ready to start him on that as of yet. We are very close. 02/05/2019 on evaluation today patient actually appears to be doing quite well with regard to his left posterior lower extremity ulcer. He still has a very tiny opening remaining but the fortunate thing is he seems to be healing quite nicely. He also did get his Farrow wrap which I am hoping will help with his edema control as well at home. Fortunately there is no evidence of active infection. 02/12/2019 patient and fortunately appears to be doing poorly in regard to his wounds of the left lower extremity. He was very close to healing therefore we attempted to use his Velcro compression wraps continuing with lymphedema pumps at home. Unfortunately that does not seem to have done very well for him. He tells me that he wore them all the time but again I am not sure why if that is the case that he is having such significant edema. He is still on his fluid pills as well. With that being said there is no obvious sign of infection although I do wonder about the possibility of infection at this time as well. 02/19/2019 unfortunately upon evaluation today patient appears to be doing more poorly with regard to his left lower extremity. He is not showing signs of significant improvement and I think the biggest issue here is  that he does have an infection that appears to likely be Pseudomonas. That is based on the blue-green drainage that were noted at this time. Unfortunately the antibiotic that has been on is not going to take care of this at all. I think they will get a need to switch him to either Levaquin or Cipro and this was discussed with the patient. 02/26/2019 on evaluation today patient's lower extremity on the left appears to be doing significantly better as compared to last evaluation. Fortunately there is no signs of active infection at this time. He has been tolerating the compression wrap without complication in  fact he made it the whole week at this point. He is showing signs of excellent improvement I am very happy in this regard. With that being said he is having some issues with infection we did review the results of his culture which I noted today. He did have a positive finding for Enterobacter as well as Alcaligenes faecalis. Fortunately the Levaquin that I placed him on will work for both which is great news. There is no signs of systemic infection at this point. 10/30; left posterior leg wound in the setting of very significant edema and what looks like chronic venous inflammation. He has compression pumps but does not use them. We have been using 3 layer compression. Silver alginate to the wound as the primary dressing 03/18/2019 on evaluation today patient appears to be doing a little better compared to last time I saw him. He really has not been using his compression pumps he tells me that he is having too much discomfort. He has been keeping his wraps on however. He is only been taking his fluid pills every other day because he states they are not really helping and he has an appointment with his primary care provider at the [facility] tomorrow. Subsequently the wound itself on the left lower extremity does seem to be greatly improved compared to previous. 03/25/2019 on evaluation today patient appears  to be doing better with regard to his wounds on the bilateral lower extremities. The left is doing excellent the right is also doing better although both still do show some signs of open wounds noted at this point unfortunately. Fortunately there is no signs of active infection at this time. The patient also is not really having any significant pain which is good news. Unfortunately there was some confusion with the referral on vascular disease and as far as getting the patient scheduled there can be contacting him later today to do this [name], [name] ([id number]) fortunately we got this straightened out. 04/01/2019 on evaluation today patient appears to be doing no fevers, chills, nausea, vomiting, or diarrhea. Excellent at this time with regard to his lower extremities. There does not appear to be any open wound at this point which is good news. Fortunately is also no signs of active infection at this time. Overall feel like the patient has done excellent with the compression the problem is every time we got him to this point and then subsequently go to using his own compression things just go right back to where they were. I am not sure how to address this we can try to get an appointment with vascular for 2 weeks now they have yet to call him. Obviously this has become frustrating for the patient as well. I think the issue has just been an honest error as far as scheduling is concerned but nonetheless still worn out the point where I am unsure of which direction we should take. 04/08/2019 on evaluation today patient actually appears to be doing well with regard to his lower extremities. There are no open wounds at this time and things seem to be managing quite nicely as far as the overall edema control is concerned. With that being said he does have his compression socks today for [place] to go ahead and reinitiate therapy in that manner at this point. He is going to be going for shoes to be  measured on Wednesday and then coincidentally he will also be seeing vascular on Thursday. Overall I think this is good news and  again I am hopeful that they will be able to do something for him to help prevent ongoing issues with edema control as well. No fevers, chills, nausea, vomiting, or diarrhea. 04/11/2019 on evaluation today patient actually appears to be doing poorly after just being discharged on Monday of this week. He had been experiencing issues with again blisters especially on the left lower extremity. With that being said he was completely healed and appeared to be doing great this past Monday. He then subsequently has new blisters that formed before his appointment with vascular this morning. He was also measured for shoes in the interim. With that being said we may have figured out what exactly is going on and why he continues to have issues like what we are seeing at this point. He takes his compression stockings off at nighttime and then he ends up having to sleep in his chair for 5-6 hours a night. He sleeps with his feet down he cannot really get him up in the recliner and therefore he is sleeping and the worst possible his position with his feet on the floor for that majority of the time. Again as I explained to him that is about one third at minimum at least one fourth of his day that he spending with his feet dangling down on the ground and the worst possible position they could be. I think this may be what is causing the issue. Subsequently I am leaning toward thinking that he may need a hospital bed in order to elevate his legs. We likely can have to coordinate this with his primary care provider at the [facility]. Readmission: 01/26/2021 this is a patient who presents for repeat evaluation here in the clinic although it is actually been couple of years since have seen him in fact it was December 2020 when I last saw him. Subsequently he never really healed but did end up  being lost to follow-up. He tells me has been having issues ongoing with his lower extremities has bilateral lower extremity lymphedema no real significant or definitive open wounds but in general his lymphedema is way out of control. We were never able to refer him to lymphedema clinic simply due to the fact to be honest we were never able to get him completely healed. I do not see anyone with open wounds. The patient does have evidence of type 2 diabetes mellitus, lymphedema, chronic venous insufficiency, and hypertension. That really has not changed since his last evaluation. 02/09/2021 upon evaluation today patient appears to be doing a little better in regard to his legs although he still having a tremendous amount of drainage especially on the left leg. Fortunately there does not appear to be any evidence of active infection. Of note when we looked into this further it appears that the patient did not have any absorptive dressing on it was just the 4-layer compression wrap. Nonetheless this is probably big part of the issue here. 10/10; he comes in today with 3 large areas on the upper right lower leg likely remanence of denuded blistering under his compression wraps. He has no other wounds on the right. On the left he has the denuded area on the left medial foot and ankle and on the left dorsal foot. Massive lymphedema in both feet dorsally. Using Zetuvit under compression We have increased home health visitation to twice a week to change the dressings and will change it once 02/22/2021 upon evaluation today patient appears to be doing well currently  with regard to his wounds. He has been tolerating the dressing changes without complication. Fortunately there does not appear to be any evidence of active infection which is great news. No fevers, chills, nausea, vomiting, or diarrhea. The biggest issue I see currently is that home health is not putting any medicine on the actual wounds  before wrapping. 03/01/2021 upon evaluation today the patient's right leg actually appears to be doing quite well which is great news there does not appear to be any evidence of active infection at this time. No fevers, chills, nausea, vomiting, or diarrhea. With that being said the patient is having issues on the left foot where he is having significant drainage is also an ammonia smell he does not have any animals at home and this makes me concerned about a bacteria producing urea as a byproduct. Again the possible common organisms will be E. coli, Proteus, and Enterococcus. All 3 of which can be successfully treated with Levaquin. For that reason I think that this may be a good option for [place] to consider placing him on and I did obtain a culture as well for confirmation sake. 03/08/2021 upon evaluation today patient appears to be doing unfortunately still somewhat poorly in regard to his leg ulcerations. He actually has an area on the right leg where he blistered due to the fact that his wrap slid down and caused an area of pinching on his skin and this has led to a significant issue here. 03/15/2021 upon evaluation today patient unfortunately has not been wrapped appropriately with absorptive dressings nor with the appropriate technique for the third layer of the 4-layer compression wrap. These are issues that we continue to try to address with the home health nurse. Also the absorptive dressing that she had was cut in half and therefore that causes things to leak out it does not actually trap the fluid in regard to the top of the foot overall I think that all these combined are really not seeing things improved significantly here. Fortunately there does not appear to be any signs of significant infection at this time which is good news. He still is having a tremendous amount of drainage. 03/22/2021 upon evaluation today patient appears to be draining tremendously. He still continues to tell me  that he is using his pumps 2 times a day and that coupled with that tells me that he is elevating his legs as well. With that being said all things considered I am really just not seeing the improvement we would expect to see with the 4-layer compression wrap and all the above noted. He in fact had an extremely large Zetuvit dressing on both legs and that they were extremely filled to the max with fluid. This is after just being changed just before the weekend and this is Monday. Nonetheless I am concerned about the fact that there is something going on fairly significant that we cannot get any of this under control and that he is draining this significantly. He supposed be having an echocardiogram it sounds like scheduling has been an issue for him as far as getting in sooner. Its something to do with needing his cousin to drive him because of where it sat and he cannot drive himself to this appointment either way I really think he needs to try to see what he can do about making this happen a little sooner. He tells me he will call today. [name], [name] ([id number]) 03/29/2021 on evaluation today patient appears to be  doing about the same in regard to his legs. He did get his cardiology appointment moved up to 6 December which is at least good that is better than what it was before mid December. Overall very pleased in that regard. 04/05/2021 upon evaluation today patient unfortunately is still doing fairly poorly. There does not appear to be any signs of active infection at this time. No fevers, chills, nausea, vomiting, or diarrhea. Unfortunately I think until his edema is under control and overall fluid overload there is really not to be much chance that I can do much to get him better. This is quite unfortunate and frustrating both for myself and the patient to be perfectly honest. Nonetheless I think that he really needs to have a conversation both with his primary care provider as well as  cardiologist he sees the PCP on Monday and cardiology on Wednesday of next week. 04/13/2021 upon evaluation today patient appears to be doing poorly in regard to his bilateral lower extremities his left is still worse than the right. With that being said he has a tremendous amount of drainage he did see his primary care provider yesterday there really was not much there to be done from their perspective. He sees cardiology tomorrow. Nonetheless my biggest concern here is simply that if we do not get the edema under control he is going to continue to have drainage and honestly I think at some point he is going to become infected severely that is my main concern. 04/19/2021 upon evaluation today patient appears to be doing poorly still in regard to his legs. Unfortunately there does not appear to be any signs of infection at this point. He does have a tremendous amount of drainage however. We have not seen the results back from the cardiologist and the echocardiogram that was done. It appears that the patient checked out okay as far as that is concerned with regard to ejection fraction though we still have some issues here to be honest with his diastolic function. I am unsure if this is accounting for everything that we are seeing or not. Either way he has a tremendous amount of drainage from his legs that we are just not able to control in the outpatient setting at this point. I have reached out to Dr. Rockey Situ his cardiologist to see once he reviews the sheet if there is anything that he feels like can be done from an outpatient perspective if not then I think the way to go is probably can to be through inpatient admission and diuresis. Otherwise I am not sure how working to get this under control we tried antibiotics, compression wrapping, and I have told the patient to be elevating his legs I am not sure how much he does of this but either way I think that this is still an ongoing issue  nonetheless. 04/26/2021 upon evaluation today patient appears to be doing poorly in regard to his legs. He is having a tremendous amount of fluid at this point which is quite unfortunate. Its to the point that he may have had at least 5 to 10 pounds of fluid in his dressings this morning when they were removed these were changed this Friday. Subsequently I think he needs to go to the ER for further evaluation and treatment I think is probably can need diuresis possibly even IV antibiotics been on what the blood work looks like but in general I feel like he needs something to get this under control from  an outpatient perspective absent of everywhere I can think of and I cannot get this under control with our traditional measures. I think this is going require more so that we can get him better 12/30; this is a patient with severe bilateral lymphedema. He was hospitalized from 04/26/2021 through 04/29/2021 treated for cellulitis in the setting of lower extremity ulcers and lymphedema. After he left the hospital he is apparently seen for nurse visit our staff contacted cardiology and he has been started on Lasix 40 mg. Apparently his legs have less edema. Lab work from 05/04/2021 showed a BUN of 38 and creatinine of 1.59 these are elevated versus previous where his creatinine seems to have been 1.30 on 12/19 his potassium is 4.3. I believe the lab work is being followed by cardiology We have him in a 4-layer wrap. Xeroform on the leg wounds and sit to fit on the Berry damage skin on the left dorsal foot versus right dorsal foot. He has compression pumps but does not use them. We have apparently not yet ordered him compression stockings 05/17/2021 upon evaluation today patient's legs though better than last time I personally saw him appear to be getting worse compared to where they were previous. Dr. Quentin Cornwall was actually last 1 to see you I have not seen him since 19 December. That was before he went into  the ER. Coming out apparently his legs looked also and they still look better but not as good as they were in the past. 1/16; patient with severe bilateral lymphedema. Severe scaled hyperkeratotic skin on the dorsal aspect of his distal left foot and left medial ankle.. On the right side changes are not as bad. He did not have any weeping edema. Our intake nurse was convinced that he is being compliant with compression pumps 1 hour twice a day 05/31/2021 upon evaluation today patient actually appears to be doing a little bit better in my opinion in regard to his feet. I do not see as much drainage and it being just completely wet as it was previous. Fortunately I do not also see any signs of active infection which is great news as well. 06/07/21 Upon inspection patient's wound bed actually showed signs of doing well he is not nearly as weepy and wet as he has been in the past and overall very pleased in that regard. Fortunately I do not see any signs of active infection locally or nor systemically at this time. Which is great news. No fevers, chills, nausea, vomiting, or diarrhea. Electronic Signature(s) Signed: 06/07/2021 2:15:03 PM By: Worthy Keeler PA-C Entered By: Worthy Keeler on 06/07/2021 14:15:03 [name] ([id number]) -------------------------------------------------------------------------------- Physical Exam Details Patient Name: [name], [name] Date of Service: 06/07/2021 1:00 PM Medical Record Number: [id number] Patient Account Number: [id number] Date of Birth/Sex: 03-26-1951 (70 y.o. M) Treating RN: Donnamarie Poag Primary Care Provider: Felipa Eth Other Clinician: Referring Provider: Felipa Eth Treating Provider/Extender: Skipper Cliche in Treatment: 16 Constitutional Well-nourished and well-hydrated in no acute distress. Respiratory normal breathing without difficulty. Psychiatric this patient is able to make decisions and demonstrates good insight into  disease process. Alert and Oriented x 3. pleasant and cooperative. Notes Upon inspection patient's feet actually appear to be less moist than what we noticed in the past. If like he is in a much better spot. He is going to get his toenails trimmed tomorrow and then tomorrow evening will be going to hopefully get his shoes from the finger clinic. Subsequently overall I think  that he is doing quite well compared to where we have been in the past. Electronic Signature(s) Signed: 06/07/2021 2:15:14 PM By: Worthy Keeler PA-C Entered By: Worthy Keeler on 06/07/2021 14:15:14 [name] ([id number]) -------------------------------------------------------------------------------- Physician Orders Details Patient Name: [name] Date of Service: 06/07/2021 1:00 PM Medical Record Number: [id number] Patient Account Number: [id number] Date of Birth/Sex: Jul 28, 1950 (70 y.o. M) Treating RN: Donnamarie Poag Primary Care Provider: Felipa Eth Other Clinician: Referring Provider: Felipa Eth Treating Provider/Extender: Skipper Cliche in Treatment: 23 Verbal / Phone Orders: No Diagnosis Coding ICD-10 Coding Code Description E11.622 Type 2 diabetes mellitus with other skin ulcer I89.0 Lymphedema, not elsewhere classified I87.2 Venous insufficiency (chronic) (peripheral) L97.822 Non-pressure chronic ulcer of other part of left lower leg with fat layer exposed L97.512 Non-pressure chronic ulcer of other part of right foot with fat layer exposed I10 Essential (primary) hypertension L97.811 Non-pressure chronic ulcer of other part of right lower leg limited to breakdown of skin Follow-up Appointments o Return Appointment in 1 week. o Nurse Visit as needed [place] for wound care. May utilize formulary equivalent dressing for wound treatment orders unless otherwise specified. Home Health Nurse may visit PRN to address  patientos wound care needs. - frequency 3 times per week - patient will be seen once at wound center - home health to see patient 2 times per week o Scheduled days for dressing changes to be completed; exception, patient has scheduled wound care visit that day. o **Please direct any NON-WOUND related issues/requests for orders to patient's Primary Care Physician. **If current dressing causes regression in wound condition, may D/C ordered dressing product/s and apply Normal Saline Moist Dressing daily until next [place] or Other MD appointment. **Notify Wound Healing Center of regression in wound condition at [phone]. Bathing/ Shower/ Hygiene o May shower with wound dressing protected with water repellent cover or cast protector. o No tub bath. Edema Control - Lymphedema / Segmental Compressive Device / Other Bilateral Lower Extremities o Optional: One layer of unna paste to top of compression wrap (to act as an anchor). - Unna paste on feet and calf to secure wrap in place as needed o 4 Layer Compression System Lymphedema. - bilateral legs bi lat, apply zetuvits/ or formulary for extra absorbant dressing around ankles and foot area, use ABD to cover toes; 3 times per week COTTON LAYER SPIRAL, LIGHT TAN SPIRAL. WHITE WITH YELLOW LINE FIGURE 8 , COBAN SPIRAL o Elevate, Exercise Daily and Avoid Standing for Long Periods of Time. o Elevate legs to the level of the heart and pump ankles as often as possible o Elevate leg(s) parallel to the floor when sitting. o Compression Pump: Use compression pump on left lower extremity for 60 minutes, twice daily. - 2 times per day o DO YOUR BEST to sleep in the bed at night. DO NOT sleep in your recliner. Long hours of sitting in a recliner leads to swelling of the legs and/or potential wounds on your backside. o Other: - Contact prescriber regarding use of diuretics to reduce fluid overload. Off-Loading Bilateral  Lower Extremities o Open toe surgical shoe - [facility] appt to fit for footwear 06/08/21 o Turn and reposition every 2 hours Additional Orders / Instructions o Follow Nutritious Diet and Increase Protein Intake Wound Treatment Wound #18 - Foot Wound Laterality: Dorsal, Left Cleanser: Normal Saline 3 x Per Week/15 Days GREY, RAKESTRAW ([id number]) Discharge Instructions: Wash your  hands with soap and water. Remove old dressing, discard into plastic bag and place into trash. Cleanse the wound with Normal Saline prior to applying a clean dressing using gauze sponges, not tissues or cotton balls. Do not scrub or use excessive force. Pat dry using gauze sponges, not tissue or cotton balls. Cleanser: Soap and Water 3 x Per Week/15 Days Discharge Instructions: Gently cleanse wound with antibacterial soap, rinse and pat dry prior to dressing wounds Cleanser: Wound Cleanser 3 x Per Week/15 Days Discharge Instructions: Wash your hands with soap and water. Remove old dressing, discard into plastic bag and place into trash. Cleanse the wound with Wound Cleanser prior to applying a clean dressing using gauze sponges, not tissues or cotton balls. Do not scrub or use excessive force. Pat dry using gauze sponges, not tissue or cotton balls. Primary Dressing: Silvercel 4 1/4x 4 1/4 (in/in) 3 x Per Week/15 Days Discharge Instructions: Apply Silvercel 4 1/4x 4 1/4 (in/in) as instructed Secondary Dressing: ABD Pad 5x9 (in/in) 3 x Per Week/15 Days Discharge Instructions: Cover with ABD pad Compression Wrap: Medichoice 4 layer Compression System, 35-40 mmHG 3 x Per Week/15 Days Discharge Instructions: Apply multi-layer wrap as directed. Electronic Signature(s) Signed: 06/07/2021 3:55:39 PM By: Donnamarie Poag Signed: 06/07/2021 5:27:53 PM By: Worthy Keeler PA-C Entered By: Donnamarie Poag on 06/07/2021 13:59:32 [name]  ([id number]) -------------------------------------------------------------------------------- Problem List Details Patient Name: [name] Date of Service: 06/07/2021 1:00 PM Medical Record Number: [id number] Patient Account Number: [id number] Date of Birth/Sex: [birth year] (71 y.o. M) Treating RN: Donnamarie Poag Primary Care Provider: Felipa Eth Other Clinician: Referring Provider: Felipa Eth Treating Provider/Extender: Skipper Cliche in Treatment: 18 Active Problems ICD-10 Encounter Code Description Active Date MDM Diagnosis E11.622 Type 2 diabetes mellitus with other skin ulcer 01/26/2021 No Yes I89.0 Lymphedema, not elsewhere classified 01/26/2021 No Yes I87.2 Venous insufficiency (chronic) (peripheral) 01/26/2021 No Yes L97.822 Non-pressure chronic ulcer of other part of left lower leg with fat layer 01/26/2021 No Yes exposed L97.512 Non-pressure chronic ulcer of other part of right foot with fat layer 01/26/2021 No Yes exposed Joyce (primary) hypertension 01/26/2021 No Yes L97.811 Non-pressure chronic ulcer of other part of right lower leg limited to 02/15/2021 No Yes breakdown of skin Inactive Problems Resolved Problems Electronic Signature(s) Signed: 06/07/2021 1:24:01 PM By: Worthy Keeler PA-C Entered By: Worthy Keeler on 06/07/2021 13:24:01 [name] ([id number]) -------------------------------------------------------------------------------- Progress Note Details Patient Name: [name] Date of Service: 06/07/2021 1:00 PM Medical Record Number: [id number] Patient Account Number: [id number] Date of Birth/Sex: March 03, 1951 (71 y.o. M) Treating RN: Donnamarie Poag Primary Care Provider: Felipa Eth Other Clinician: Referring Provider: Felipa Eth Treating Provider/Extender: Skipper Cliche in Treatment: 18 Subjective Chief Complaint Information obtained from Patient Left LE ulcers History of Present Illness (HPI) 71 year old male who  presented to the ER with bilateral lower extremity blisters which had started last week. he has a past medical history of leukemia, diabetes mellitus, hypertension, edema of both lower extremities, his recurrent skin infections, peripheral vascular disease, coronary artery disease, congestive heart failure and peripheral neuropathy. in the ER he was given Rocephin and put on Silvadene cream. he was put on oral doxycycline and was asked to follow-up with the [facility]. His last hemoglobin A1c was 6.6 in December and he checks his blood sugar once a week. He does not have any physicians outside the [place] system. He does not recall any vascular duplex studies done either for arterial or venous disease but was told  to wear compression stockings which he does not use 05/30/2016 -- we have not yet received any of his notes from the [facility] hospital system and his arterial and venous duplex studies are scheduled here in [place] around mid February. We are unable to have his insurance accepted by home health agencies and hence he is getting dressings only once a week. 06/06/16 -- -- I received a call from the patient's PCP at the [facility] at [facility] and spoke to Dr. Garvin Fila, phone number [phone] and fax number [phone]. She confirmed that no vascular testing was done over the last 5 years and she would be happy to do them if the patient did want them to be done at the [place] and we could fax him a request. Readmission: 71 year old male seen by as in February of this year and was referred to vein and vascular for studies and opinion from the vascular surgeons. The patient returns today with a fresh problem having had blisters on his left lower extremity which have been there for about 5 days and he clearly states that he has been wearing his compression stockings as advised though he could not read the moderate compression and has been wearing light compression. Review of his electronic medical records  note that he had lower extremity arterial duplex examination done on 06/23/2016 which showed no hemodynamically significant stenosis in the bilateral lower extremity arterial system. He also had a lower extremity venous reflux examination done on 07/07/2016 and it was noted that he had venous incompetence in the right great saphenous vein and bilateral common femoral veins. Patient was seen by Dr. Tamala Julian on the same day and for some reason his notes do not reflect the venous studies or the arterial studies and he recommended patient do a venous duplex ultrasound to look for reflux and return to see him.he would also consider a lymph pump if required. The patient was told that his workup was normal and hence the patient canceled his follow-up appointment. 02/03/17 on evaluation today patient left medial lower extremity blister appears to be doing about the same. It is still continuing to drain and there's still the blistered skin covering the wound bed which is making it difficult for the alternate to do its job. Fortunately there is no evidence of cellulitis. No fevers chills noted. Patient states in general he is not having any significant discomfort. Patient's lower extremity arterial duplex exam revealed that patient was hemodynamically stable with no evidence of stenosis in regard to the bilateral lower extremities. The lower extremity venous reflux exam revealed the patient had venous incontinence noted in the right greater saphenous and bilateral common femoral vein. There is no evidence of deep or superficial vein thrombosis in the bilateral lower extremities. Readmission: 11/12/18 Patient presents for evaluation our clinic today concerning issues that he is having with his left lower extremity. He tells me that a couple weeks ago he began developing blisters on the left lower extremity along with increased swelling. He typically wears his compression stockings on a regular basis is previously  been evaluated both here as well is with vascular surgery they would recommend lymphedema pumps but unfortunately that somehow fell through and he never heard anything back from that. Nonetheless I think lymphedema pumps would be beneficial for this patient. He does have a history of hypertension and diabetes. Obviously the chronic venous stasis and lymphedema as well. At this point the blisters have been given in more trouble he states sometimes when the blisters  openings able to clean it down with alcohol and it will dry out and do well. Unfortunately that has not been the case this time. He is having some discomfort although this mean these with cleaning the areas he doesn't have discomfort just on a regular basis. He has not been able to wear his compression stockings since the blisters arose due to the fact that of course it will drain into the socks causing additional issues and he didn't have any way to wrap this otherwise. He has increased to taking his Lasix every day instead of every other day. He sees his primary care provider later this month as well. No fevers, chills, nausea, or vomiting noted at this time. 11/19/18-Patient returns at 1 week, per intake RN the amount of seepage into the compression wraps was definitely improved, overall all the wounds are measuring smaller but continuing silver alginate to the wounds as primary dressing 11/26/18 on evaluation today patient appears to be doing quite well in regard to his left lower [place] ulcers. In fact of the areas that were noted initially he only has two regions still open. There is no evidence of active infection at this time. He still is not heard anything from the company regarding lymphedema pumps as of yet. Again as previously seen vascular they have not recommended any surgical intervention. [name], [name] ([id number]) 12/03/2018 on evaluation today patient actually appears to be doing quite well with regard to his lower extremity  ulcers. In fact most of the areas appear to be healed the one spot which does not seem to be completely healed I am unsure of whether or not this is really draining that much but nonetheless there does not appear to be any signs of infection or significant drainage at this point. There is no sign of fever, chills, nausea, vomiting, or diarrhea. Overall I am pleased with how things have progressed I think is very close to being able to transition to his home compression stockings. 12/10/2018 upon evaluation today patient appears to be doing quite well with regard to his left lower extremity. He has been tolerating the dressing changes without complication. Fortunately there is no signs of active infection at this time. He appears after thorough evaluation of his leg to only have 1 small area that remains open at this point everything else appears to be almost completely closed. He still have significant swelling of the left lower extremity. We had discussed discussing this with his primary care provider he is not able to see her in person they were at the [facility] and right now the [place] is not seeing patients on site. According to the patient anyway. Subsequently he did speak with her apparently and his primary care provider feels that he may likely have a DVT. With that being said she has not seen his leg she is just going off of his history. Nonetheless that is a concern that the patient now has as well and while I do not feel the DVT is likely we can definitely ensure that that is not the case I will go ahead and see about putting that order in today. Nonetheless otherwise I am in a recommend that we continue with the current wound care measures including the compression therapy most likely. We just need to ensure that his leg is indeed free of any DVTs. 12/17/2018 on evaluation today patient actually appears to be completely healed today. He does have 2 very small areas of blistering although this is  not anything too significant at this point which is good news. With that being said I am in agreement with the fact that I think he is completely healed at this point. He does want to get back into his compression stocking. The good news is we have gotten approval from insurance for his lymphedema pumps we received a letter since last saw him last week. The other good news is his study did come back and showed no evidence of a DVT. 12/20/2018 on evaluation today patient presents for follow-up concerning his ongoing issues with his left lower extremity. He was actually discharged last Friday and did fairly well until he states blisters opened this morning. He tells me he has been wearing his compression stocking although he has a hard time getting this on. There does not appear to be any signs of active infection at this time. No fevers, chills, nausea, vomiting, or diarrhea. 12/27/2018 on evaluation today patient appears to be doing very well with regard to his swelling of the left lower extremity the 4 layer compression wrap seems to have been beneficial for him. Fortunately there is no signs of active infection at this time. Patient has been tolerating the compression wrap without complication and his foot swelling in particular appears to be greatly improved. He does still have a wound on the lateral portion of his left leg I believe this is more of a blister that has now reopened. 01/03/2019 on evaluation today patient actually appears to be doing excellent in regard to his left lower extremity. He did receive his compression pumps and is actually use this 7 times since he was last here in the office. On top of the compression wrap he is now roughly 3 cm better at the calf and 2 cm better at the ankle he also states that his foot seem to go an issue better without even having to use a shoe horn. Obviously I think this is all evidence that he is doing excellent in this regard. The other good news is  he does not appear to have anything open today as far as wounds are concerned. 01/15/2019 on evaluation today patient appears to be doing more poorly yet again with regard to his left lower extremity. He has developed new wounds again after being discharged just recently. Unfortunately this continues to be the case that he will heal and then have subsequent new wounds. The last time I was hopeful that he may not end up coming back too quickly especially since he states he has been using his lymphedema pumps along with wearing his compression. Nonetheless he had a blister on the back of his leg that popped up on the left and this has opened up into an ulceration it is quite painful. 01/22/19 on evaluation today patient actually appears to be doing well with regard to his wound on the left lower extremity. He's been tolerating the dressing changes without complication including the compression wrap in the wound appears to be significantly smaller today which is great news. Overall very pleased in this regard. 01/29/2019 on evaluation today patient appears to be doing well with regard to his left posterior lower extremity ulcer. He has been tolerating the dressing changes without complication. This is not completely healed but is getting much closer. We did order a Farrow wrap 4000 for him he has received this and has it with him today although I am not sure we are quite ready to start him on that as of yet.  We are very close. 02/05/2019 on evaluation today patient actually appears to be doing quite well with regard to his left posterior lower extremity ulcer. He still has a very tiny opening remaining but the fortunate thing is he seems to be healing quite nicely. He also did get his Farrow wrap which I am hoping will help with his edema control as well at home. Fortunately there is no evidence of active infection. 02/12/2019 patient and fortunately appears to be doing poorly in regard to his wounds of the  left lower extremity. He was very close to healing therefore we attempted to use his Velcro compression wraps continuing with lymphedema pumps at home. Unfortunately that does not seem to have done very well for him. He tells me that he wore them all the time but again I am not sure why if that is the case that he is having such significant edema. He is still on his fluid pills as well. With that being said there is no obvious sign of infection although I do wonder about the possibility of infection at this time as well. 02/19/2019 unfortunately upon evaluation today patient appears to be doing more poorly with regard to his left lower extremity. He is not showing signs of significant improvement and I think the biggest issue here is that he does have an infection that appears to likely be Pseudomonas. That is based on the blue-green drainage that were noted at this time. Unfortunately the antibiotic that has been on is not going to take care of this at all. I think they will get a need to switch him to either Levaquin or Cipro and this was discussed with the patient. 02/26/2019 on evaluation today patient's lower extremity on the left appears to be doing significantly better as compared to last evaluation. Fortunately there is no signs of active infection at this time. He has been tolerating the compression wrap without complication in fact he made it the whole week at this point. He is showing signs of excellent improvement I am very happy in this regard. With that being said he is having some issues with infection we did review the results of his culture which I noted today. He did have a positive finding for Enterobacter as well as Alcaligenes faecalis. Fortunately the Levaquin that I placed him on will work for both which is great news. There is no signs of systemic infection at this point. 10/30; left posterior leg wound in the setting of very significant edema and what looks like chronic venous  inflammation. He has compression pumps but does not use them. We have been using 3 layer compression. Silver alginate to the wound as the primary dressing 03/18/2019 on evaluation today patient appears to be doing a little better compared to last time I saw him. He really has not been using his compression pumps he tells me that he is having too much discomfort. He has been keeping his wraps on however. He is only been taking his fluid pills every other day because he states they are not really helping and he has an appointment with his primary care provider at the [facility] tomorrow. Subsequently the wound itself on the left lower extremity does seem to be greatly improved compared to previous. [name], [name] ([id number]) 03/25/2019 on evaluation today patient appears to be doing better with regard to his wounds on the bilateral lower extremities. The left is doing excellent the right is also doing better although both still do show some signs  of open wounds noted at this point unfortunately. Fortunately there is no signs of active infection at this time. The patient also is not really having any significant pain which is good news. Unfortunately there was some confusion with the referral on vascular disease and as far as getting the patient scheduled there can be contacting him later today to do this fortunately we got this straightened out. 04/01/2019 on evaluation today patient appears to be doing no fevers, chills, nausea, vomiting, or diarrhea. Excellent at this time with regard to his lower extremities. There does not appear to be any open wound at this point which is good news. Fortunately is also no signs of active infection at this time. Overall feel like the patient has done excellent with the compression the problem is every time we got him to this point and then subsequently go to using his own compression things just go right back to where they were. I am not sure how to address this we can try  to get an appointment with vascular for 2 weeks now they have yet to call him. Obviously this has become frustrating for the patient as well. I think the issue has just been an honest error as far as scheduling is concerned but nonetheless still worn out the point where I am unsure of which direction we should take. 04/08/2019 on evaluation today patient actually appears to be doing well with regard to his lower extremities. There are no open wounds at this time and things seem to be managing quite nicely as far as the overall edema control is concerned. With that being said he does have his compression socks today for [place] to go ahead and reinitiate therapy in that manner at this point. He is going to be going for shoes to be measured on Wednesday and then coincidentally he will also be seeing vascular on Thursday. Overall I think this is good news and again I am hopeful that they will be able to do something for him to help prevent ongoing issues with edema control as well. No fevers, chills, nausea, vomiting, or diarrhea. 04/11/2019 on evaluation today patient actually appears to be doing poorly after just being discharged on Monday of this week. He had been experiencing issues with again blisters especially on the left lower extremity. With that being said he was completely healed and appeared to be doing great this past Monday. He then subsequently has new blisters that formed before his appointment with vascular this morning. He was also measured for shoes in the interim. With that being said we may have figured out what exactly is going on and why he continues to have issues like what we are seeing at this point. He takes his compression stockings off at nighttime and then he ends up having to sleep in his chair for 5-6 hours a night. He sleeps with his feet down he cannot really get him up in the recliner and therefore he is sleeping and the worst possible his position with his feet on the floor  for that majority of the time. Again as I explained to him that is about one third at minimum at least one fourth of his day that he spending with his feet dangling down on the ground and the worst possible position they could be. I think this may be what is causing the issue. Subsequently I am leaning toward thinking that he may need a hospital bed in order to elevate his legs. We likely can  have to coordinate this with his primary care provider at the [facility]. Readmission: 01/26/2021 this is a patient who presents for repeat evaluation here in the clinic although it is actually been couple of years since have seen him in fact it was December 2020 when I last saw him. Subsequently he never really healed but did end up being lost to follow-up. He tells me has been having issues ongoing with his lower extremities has bilateral lower extremity lymphedema no real significant or definitive open wounds but in general his lymphedema is way out of control. We were never able to refer him to lymphedema clinic simply due to the fact to be honest we were never able to get him completely healed. I do not see anyone with open wounds. The patient does have evidence of type 2 diabetes mellitus, lymphedema, chronic venous insufficiency, and hypertension. That really has not changed since his last evaluation. 02/09/2021 upon evaluation today patient appears to be doing a little better in regard to his legs although he still having a tremendous amount of drainage especially on the left leg. Fortunately there does not appear to be any evidence of active infection. Of note when we looked into this further it appears that the patient did not have any absorptive dressing on it was just the 4-layer compression wrap. Nonetheless this is probably big part of the issue here. 10/10; he comes in today with 3 large areas on the upper right lower leg likely remanence of denuded blistering under his compression wraps. He has  no other wounds on the right. On the left he has the denuded area on the left medial foot and ankle and on the left dorsal foot. Massive lymphedema in both feet dorsally. Using Zetuvit under compression We have increased home health visitation to twice a week to change the dressings and will change it once 02/22/2021 upon evaluation today patient appears to be doing well currently with regard to his wounds. He has been tolerating the dressing changes without complication. Fortunately there does not appear to be any evidence of active infection which is great news. No fevers, chills, nausea, vomiting, or diarrhea. The biggest issue I see currently is that home health is not putting any medicine on the actual wounds before wrapping. 03/01/2021 upon evaluation today the patient's right leg actually appears to be doing quite well which is great news there does not appear to be any evidence of active infection at this time. No fevers, chills, nausea, vomiting, or diarrhea. With that being said the patient is having issues on the left foot where he is having significant drainage is also an ammonia smell he does not have any animals at home and this makes me concerned about a bacteria producing urea as a byproduct. Again the possible common organisms will be E. coli, Proteus, and Enterococcus. All 3 of which can be successfully treated with Levaquin. For that reason I think that this may be a good option for [place] to consider placing him on and I did obtain a culture as well for confirmation sake. 03/08/2021 upon evaluation today patient appears to be doing unfortunately still somewhat poorly in regard to his leg ulcerations. He actually has an area on the right leg where he blistered due to the fact that his wrap slid down and caused an area of pinching on his skin and this has led to a significant issue here. 03/15/2021 upon evaluation today patient unfortunately has not been wrapped appropriately with  absorptive dressings  nor with the appropriate technique for the third layer of the 4-layer compression wrap. These are issues that we continue to try to address with the home health nurse. Also the absorptive dressing that she had was cut in half and therefore that causes things to leak out it does not actually trap the fluid in regard to the top of the foot overall I think that all these combined are really not seeing things improved significantly here. Fortunately there does not appear to be any signs of significant infection at this time which is good news. He still is having a tremendous amount of drainage. 03/22/2021 upon evaluation today patient appears to be draining tremendously. He still continues to tell me that he is using his pumps 2 times a day and that coupled with that tells me that he is elevating his legs as well. With that being said all things considered I am really just not seeing the improvement we would expect to see with the 4-layer compression wrap and all the above noted. He in fact had an extremely large Zetuvit dressing on both legs and that they were extremely filled to the max with fluid. This is after just being changed just before the weekend and this [name], [name] ([id number]) is Monday. Nonetheless I am concerned about the fact that there is something going on fairly significant that we cannot get any of this under control and that he is draining this significantly. He supposed be having an echocardiogram it sounds like scheduling has been an issue for him as far as getting in sooner. Its something to do with needing his cousin to drive him because of where it sat and he cannot drive himself to this appointment either way I really think he needs to try to see what he can do about making this happen a little sooner. He tells me he will call today. 03/29/2021 on evaluation today patient appears to be doing about the same in regard to his legs. He did get his cardiology  appointment moved up to 6 December which is at least good that is better than what it was before mid December. Overall very pleased in that regard. 04/05/2021 upon evaluation today patient unfortunately is still doing fairly poorly. There does not appear to be any signs of active infection at this time. No fevers, chills, nausea, vomiting, or diarrhea. Unfortunately I think until his edema is under control and overall fluid overload there is really not to be much chance that I can do much to get him better. This is quite unfortunate and frustrating both for myself and the patient to be perfectly honest. Nonetheless I think that he really needs to have a conversation both with his primary care provider as well as cardiologist he sees the PCP on Monday and cardiology on Wednesday of next week. 04/13/2021 upon evaluation today patient appears to be doing poorly in regard to his bilateral lower extremities his left is still worse than the right. With that being said he has a tremendous amount of drainage he did see his primary care provider yesterday there really was not much there to be done from their perspective. He sees cardiology tomorrow. Nonetheless my biggest concern here is simply that if we do not get the edema under control he is going to continue to have drainage and honestly I think at some point he is going to become infected severely that is my main concern. 04/19/2021 upon evaluation today patient appears to be doing poorly still  in regard to his legs. Unfortunately there does not appear to be any signs of infection at this point. He does have a tremendous amount of drainage however. We have not seen the results back from the cardiologist and the echocardiogram that was done. It appears that the patient checked out okay as far as that is concerned with regard to ejection fraction though we still have some issues here to be honest with his diastolic function. I am unsure if this is  accounting for everything that we are seeing or not. Either way he has a tremendous amount of drainage from his legs that we are just not able to control in the outpatient setting at this point. I have reached out to Dr. Rockey Situ his cardiologist to see once he reviews the sheet if there is anything that he feels like can be done from an outpatient perspective if not then I think the way to go is probably can to be through inpatient admission and diuresis. Otherwise I am not sure how working to get this under control we tried antibiotics, compression wrapping, and I have told the patient to be elevating his legs I am not sure how much he does of this but either way I think that this is still an ongoing issue nonetheless. 04/26/2021 upon evaluation today patient appears to be doing poorly in regard to his legs. He is having a tremendous amount of fluid at this point which is quite unfortunate. Its to the point that he may have had at least 5 to 10 pounds of fluid in his dressings this morning when they were removed these were changed this Friday. Subsequently I think he needs to go to the ER for further evaluation and treatment I think is probably can need diuresis possibly even IV antibiotics been on what the blood work looks like but in general I feel like he needs something to get this under control from an outpatient perspective absent of everywhere I can think of and I cannot get this under control with our traditional measures. I think this is going require more so that we can get him better 12/30; this is a patient with severe bilateral lymphedema. He was hospitalized from 04/26/2021 through 04/29/2021 treated for cellulitis in the setting of lower extremity ulcers and lymphedema. After he left the hospital he is apparently seen for nurse visit our staff contacted cardiology and he has been started on Lasix 40 mg. Apparently his legs have less edema. Lab work from 05/04/2021 showed a BUN of 38 and  creatinine of 1.59 these are elevated versus previous where his creatinine seems to have been 1.30 on 12/19 his potassium is 4.3. I believe the lab work is being followed by cardiology We have him in a 4-layer wrap. Xeroform on the leg wounds and sit to fit on the Berry damage skin on the left dorsal foot versus right dorsal foot. He has compression pumps but does not use them. We have apparently not yet ordered him compression stockings 05/17/2021 upon evaluation today patient's legs though better than last time I personally saw him appear to be getting worse compared to where they were previous. Dr. Quentin Cornwall was actually last 1 to see you I have not seen him since 19 December. That was before he went into the ER. Coming out apparently his legs looked also and they still look better but not as good as they were in the past. 1/16; patient with severe bilateral lymphedema. Severe scaled hyperkeratotic skin on  the dorsal aspect of his distal left foot and left medial ankle.. On the right side changes are not as bad. He did not have any weeping edema. Our intake nurse was convinced that he is being compliant with compression pumps 1 hour twice a day 05/31/2021 upon evaluation today patient actually appears to be doing a little bit better in my opinion in regard to his feet. I do not see as much drainage and it being just completely wet as it was previous. Fortunately I do not also see any signs of active infection which is great news as well. 06/07/21 Upon inspection patient's wound bed actually showed signs of doing well he is not nearly as weepy and wet as he has been in the past and overall very pleased in that regard. Fortunately I do not see any signs of active infection locally or nor systemically at this time. Which is great news. No fevers, chills, nausea, vomiting, or diarrhea. Objective Constitutional Well-nourished and well-hydrated in no acute distress. Vitals Time Taken: 1:12 PM, Height: 73  in, Weight: 312 lbs, BMI: 41.2, Temperature: 98.1 F, Pulse: 61 bpm, Respiratory Rate: 16 breaths/min, Blood Pressure: 129/71 mmHg. [place], [name] Respiratory normal breathing without difficulty. Psychiatric this patient is able to make decisions and demonstrates good insight into disease process. Alert and Oriented x 3. pleasant and cooperative. General Notes: Upon inspection patient's feet actually appear to be less moist than what we noticed in the past. If like he is in a much better spot. He is going to get his toenails trimmed tomorrow and then tomorrow evening will be going to hopefully get his shoes from the finger clinic. Subsequently overall I think that he is doing quite well compared to where we have been in the past. Integumentary (Hair, Skin) Wound #18 status is Open. Original cause of wound was Gradually Appeared. The date acquired was: 06/07/2021. The wound is located on the Left,Dorsal Foot. The wound measures 2.1cm length x 3.5cm width x 0.1cm depth; 5.773cm^2 area and 0.577cm^3 volume. There is Fat Layer (Subcutaneous Tissue) exposed. There is no tunneling or undermining noted. There is a medium amount of serosanguineous drainage noted. There is large (67-100%) red granulation within the wound bed. There is no necrotic tissue within the wound bed. Other Condition(s) Patient presents with Lymphedema located on the Left Leg. Patient presents with Lymphedema located on the Right Leg. Assessment Active Problems ICD-10 Type 2 diabetes mellitus with other skin ulcer Lymphedema, not elsewhere classified Venous insufficiency (chronic) (peripheral) Non-pressure chronic ulcer of other part of left lower leg with fat layer exposed Non-pressure chronic ulcer of other part of right foot with fat layer exposed Essential (primary) hypertension Non-pressure chronic ulcer of other part of right lower leg limited to breakdown of skin Procedures Wound #18 Pre-procedure  diagnosis of Wound #18 is a Lymphedema located on the Left,Dorsal Foot . There was a Four Layer Compression Therapy Procedure by Donnamarie Poag, RN. Post procedure Diagnosis Wound #18: Same as Pre-Procedure There was a Four Layer Compression Therapy Procedure by Donnamarie Poag, RN. Post procedure Diagnosis Wound #: Same as Pre-Procedure Plan Follow-up Appointments: Return Appointment in 1 week. Nurse Visit as needed Home Health: [place]: - [place] for wound care. May utilize formulary equivalent dressing for wound treatment orders unless otherwise specified. Home Health Nurse may visit PRN to address patient s wound care needs. - frequency 3 times per week - patient will be seen once at wound center - home  health to see patient 2 times per week Scheduled days for dressing changes to be completed; exception, patient has scheduled wound care visit that day. **Please direct any NON-WOUND related issues/requests for orders to patient's Primary Care Physician. **If current dressing causes regression in wound condition, may D/C ordered dressing product/s and apply Normal Saline Moist Dressing daily until next [place] or Other MD appointment. **Notify Wound Healing Center of regression in wound condition at [phone]. [name], [name] ([id number]) Bathing/ Shower/ Hygiene: May shower with wound dressing protected with water repellent cover or cast protector. No tub bath. Edema Control - Lymphedema / Segmental Compressive Device / Other: Optional: One layer of unna paste to top of compression wrap (to act as an anchor). - Unna paste on feet and calf to secure wrap in place as needed 4 Layer Compression System Lymphedema. - bilateral legs bi lat, apply zetuvits/ or formulary for extra absorbant dressing around ankles and foot area, use ABD to cover toes; 3 times per week COTTON LAYER SPIRAL, LIGHT TAN SPIRAL. WHITE WITH YELLOW LINE FIGURE 8 ,  COBAN SPIRAL Elevate, Exercise Daily and Avoid Standing for Long Periods of Time. Elevate legs to the level of the heart and pump ankles as often as possible Elevate leg(s) parallel to the floor when sitting. Compression Pump: Use compression pump on left lower extremity for 60 minutes, twice daily. - 2 times per day DO YOUR BEST to sleep in the bed at night. DO NOT sleep in your recliner. Long hours of sitting in a recliner leads to swelling of the legs and/or potential wounds on your backside. Other: - Contact prescriber regarding use of diuretics to reduce fluid overload. Off-Loading: Open toe surgical shoe - [facility] appt to fit for footwear 06/08/21 Turn and reposition every 2 hours Additional Orders / Instructions: Follow Nutritious Diet and Increase Protein Intake WOUND #18: - Foot Wound Laterality: Dorsal, Left Cleanser: Normal Saline 3 x Per Week/15 Days Discharge Instructions: Wash your hands with soap and water. Remove old dressing, discard into plastic bag and place into trash. Cleanse the wound with Normal Saline prior to applying a clean dressing using gauze sponges, not tissues or cotton balls. Do not scrub or use excessive force. Pat dry using gauze sponges, not tissue or cotton balls. Cleanser: Soap and Water 3 x Per Week/15 Days Discharge Instructions: Gently cleanse wound with antibacterial soap, rinse and pat dry prior to dressing wounds Cleanser: Wound Cleanser 3 x Per Week/15 Days Discharge Instructions: Wash your hands with soap and water. Remove old dressing, discard into plastic bag and place into trash. Cleanse the wound with Wound Cleanser prior to applying a clean dressing using gauze sponges, not tissues or cotton balls. Do not scrub or use excessive force. Pat dry using gauze sponges, not tissue or cotton balls. Primary Dressing: Silvercel 4 1/4x 4 1/4 (in/in) 3 x Per Week/15 Days Discharge Instructions: Apply Silvercel 4 1/4x 4 1/4 (in/in) as  instructed Secondary Dressing: ABD Pad 5x9 (in/in) 3 x Per Week/15 Days Discharge Instructions: Cover with ABD pad Compression Wrap: Medichoice 4 layer Compression System, 35-40 mmHG 3 x Per Week/15 Days Discharge Instructions: Apply multi-layer wrap as directed. 1. Would recommend currently that we continue with the wound care measures as before and the patient is in agreement with plan. This includes the use of the compression wrapping. Were actually still utilizing a 4-layer compression wrap. 2. We will also get a continue with the silver alginate dressing. 3. I am also can  recommend that we have the patient continue with ABD pads to cover at this point. We will see patient back for reevaluation in 1 week here in the clinic. If anything worsens or changes patient will contact our office for additional recommendations. Electronic Signature(s) Signed: 06/07/2021 2:15:36 PM By: Worthy Keeler PA-C Entered By: Worthy Keeler on 06/07/2021 14:15:36 [name] ([id number]) -------------------------------------------------------------------------------- SuperBill Details Patient Name: [name] Date of Service: 06/07/2021 Medical Record Number: [id number] Patient Account Number: [id number] Date of Birth/Sex: 13-Aug-1950 (70 y.o. M) Treating RN: Donnamarie Poag Primary Care Provider: Felipa Eth Other Clinician: Referring Provider: Felipa Eth Treating Provider/Extender: Skipper Cliche in Treatment: 18 Diagnosis Coding ICD-10 Codes Code Description E11.622 Type 2 diabetes mellitus with other skin ulcer I89.0 Lymphedema, not elsewhere classified I87.2 Venous insufficiency (chronic) (peripheral) L97.822 Non-pressure chronic ulcer of other part of left lower leg with fat layer exposed L97.512 Non-pressure chronic ulcer of other part of right foot with fat layer exposed I10 Essential (primary) hypertension L97.811 Non-pressure chronic ulcer of other part of right lower leg  limited to breakdown of skin Facility Procedures CPT4: Description Modifier Quantity Code [id number] BILATERAL: Application of multi-layer venous compression system; leg (below knee), including 1 ankle and foot. Physician Procedures CPT4 Code: 8891694 Description: 50388 - WC PHYS LEVEL 4 - EST PT Modifier: Quantity: 1 CPT4 Code: Description: ICD-10 Diagnosis Description E11.622 Type 2 diabetes mellitus with other skin ulcer I89.0 Lymphedema, not elsewhere classified I87.2 Venous insufficiency (chronic) (peripheral) L97.822 Non-pressure chronic ulcer of other part of left lower  leg with fat layer Modifier: exposed Quantity: Electronic Signature(s) Signed: 06/07/2021 2:21:28 PM By: Worthy Keeler PA-C Entered By: Worthy Keeler on 06/07/2021 14:21:28

## 2021-06-08 ENCOUNTER — Encounter: Payer: Self-pay | Admitting: Podiatry

## 2021-06-08 ENCOUNTER — Ambulatory Visit: Payer: PPO | Admitting: Podiatry

## 2021-06-08 DIAGNOSIS — M79675 Pain in left toe(s): Secondary | ICD-10-CM | POA: Diagnosis not present

## 2021-06-08 DIAGNOSIS — M79674 Pain in right toe(s): Secondary | ICD-10-CM

## 2021-06-08 DIAGNOSIS — B351 Tinea unguium: Secondary | ICD-10-CM

## 2021-06-08 DIAGNOSIS — I890 Lymphedema, not elsewhere classified: Secondary | ICD-10-CM

## 2021-06-08 NOTE — Progress Notes (Signed)
° °  SUBJECTIVE Patient presents to office today complaining of elongated, thickened nails that cause pain while ambulating in shoes.  Patient is seen weekly at the wound care center for chronic wounds to the bilateral feet.  Dressings are intact today.  He is requesting that the dressings not be removed.  He has a nurse coming to the house tomorrow for dressing change.  Patient is unable to trim their own nails. Patient is here for further evaluation and treatment.  Past Medical History:  Diagnosis Date   Diabetes mellitus without complication ([place])    Hyperlipidemia    Hypertension    Leukemia ([place])     OBJECTIVE General Patient is awake, alert, and oriented x 3 and in no acute distress. Derm dressings left intact to the bilateral lower extremities.  The forefoot was exposed however demonstrating severe bilateral lower extremity lymphedema with superficial skin breakdown and maceration with drainage.  No malodor. Vasc chronic bilateral severe lower extremity lymphedema noted with superficial wounds.  Dressings are intact.  They were not removed today Neuro Epicritic and protective threshold sensation diminished to the toes Musculoskeletal Exam dressings left intact to the bilateral lower extremities.  ASSESSMENT 1.  Pain due to onychomycosis of toenails both 2.  Chronic bilateral severe lower extremity lymphedema with superficial wounds managed by wound care center and home health [place] 1. Patient evaluated today.  2.  Continue management weekly at the wound care center with a home health nurse dressing changes 2 times per week. 3. Mechanical debridement of nails 1-5 bilaterally performed using a nail nipper. Filed with dremel without incident.  4. Return to clinic in 3 mos. for routine foot and nail care   Edrick Kins, DPM [facility]  Dr. Edrick Kins, DPM    [address], [place] [address]                 Office [phone]  Fax [phone]

## 2021-06-14 ENCOUNTER — Other Ambulatory Visit: Payer: Self-pay

## 2021-06-14 ENCOUNTER — Encounter: Payer: PPO | Attending: Physician Assistant | Admitting: Physician Assistant

## 2021-06-14 DIAGNOSIS — E11622 Type 2 diabetes mellitus with other skin ulcer: Secondary | ICD-10-CM | POA: Insufficient documentation

## 2021-06-14 DIAGNOSIS — I890 Lymphedema, not elsewhere classified: Secondary | ICD-10-CM | POA: Insufficient documentation

## 2021-06-14 DIAGNOSIS — I509 Heart failure, unspecified: Secondary | ICD-10-CM | POA: Insufficient documentation

## 2021-06-14 DIAGNOSIS — I872 Venous insufficiency (chronic) (peripheral): Secondary | ICD-10-CM | POA: Diagnosis not present

## 2021-06-14 DIAGNOSIS — E1142 Type 2 diabetes mellitus with diabetic polyneuropathy: Secondary | ICD-10-CM | POA: Insufficient documentation

## 2021-06-14 DIAGNOSIS — L97512 Non-pressure chronic ulcer of other part of right foot with fat layer exposed: Secondary | ICD-10-CM | POA: Diagnosis not present

## 2021-06-14 DIAGNOSIS — I2510 Atherosclerotic heart disease of native coronary artery without angina pectoris: Secondary | ICD-10-CM | POA: Insufficient documentation

## 2021-06-14 DIAGNOSIS — L97822 Non-pressure chronic ulcer of other part of left lower leg with fat layer exposed: Secondary | ICD-10-CM | POA: Insufficient documentation

## 2021-06-14 DIAGNOSIS — L97811 Non-pressure chronic ulcer of other part of right lower leg limited to breakdown of skin: Secondary | ICD-10-CM | POA: Diagnosis not present

## 2021-06-14 DIAGNOSIS — Z856 Personal history of leukemia: Secondary | ICD-10-CM | POA: Insufficient documentation

## 2021-06-14 DIAGNOSIS — I110 Hypertensive heart disease with heart failure: Secondary | ICD-10-CM | POA: Diagnosis not present

## 2021-06-14 DIAGNOSIS — I10 Essential (primary) hypertension: Secondary | ICD-10-CM | POA: Insufficient documentation

## 2021-06-14 NOTE — Progress Notes (Addendum)
NICODEMUS, DENK ([id number]) Visit Report for 06/14/2021 Chief Complaint Document Details Patient Name: [name] Date of Service: 06/14/2021 3:45 PM Medical Record Number: [id number] Patient Account Number: [id number] Date of Birth/Sex: 10/22/1950 (71 y.o. M) Treating RN: Levora Dredge Primary Care Provider: Felipa Eth Other Clinician: Referring Provider: Felipa Eth Treating Provider/Extender: Skipper Cliche in Treatment: 19 Information Obtained from: Patient Chief Complaint Left LE ulcers Electronic Signature(s) Signed: 06/14/2021 3:48:11 PM By: Worthy Keeler PA-C Entered By: Worthy Keeler on 06/14/2021 15:48:11 [name] ([id number]) -------------------------------------------------------------------------------- HPI Details Patient Name: [name] Date of Service: 06/14/2021 3:45 PM Medical Record Number: [id number] Patient Account Number: [id number] Date of Birth/Sex: [birth year] (71 y.o. M) Treating RN: Levora Dredge Primary Care Provider: Felipa Eth Other Clinician: Referring Provider: Felipa Eth Treating Provider/Extender: Skipper Cliche in Treatment: 19 History of Present Illness HPI Description: 71 year old male who presented to the ER with bilateral lower extremity blisters which had started last week. he has a past medical history of leukemia, diabetes mellitus, hypertension, edema of both lower extremities, his recurrent skin infections, peripheral vascular disease, coronary artery disease, congestive heart failure and peripheral neuropathy. in the ER he was given Rocephin and put on Silvadene cream. he was put on oral doxycycline and was asked to follow-up with the [facility]. His last hemoglobin A1c was 6.6 in December and he checks his blood sugar once a week. He does not have any physicians outside the [place] system. He does not recall any vascular duplex studies done either for arterial or venous disease but was told to wear  compression stockings which he does not use 05/30/2016 -- we have not yet received any of his notes from the [facility] hospital system and his arterial and venous duplex studies are scheduled here in [place] around mid February. We are unable to have his insurance accepted by home health agencies and hence he is getting dressings only once a week. 06/06/16 -- -- I received a call from the patient's PCP at the [facility] and spoke to Dr. Garvin Fila, phone number [phone] and fax number [phone]. She confirmed that no vascular testing was done over the last 5 years and she would be happy to do them if the patient did want them to be done at the [place] and we could fax him a request. Readmission: 71 year old male seen by as in February of this year and was referred to vein and vascular for studies and opinion from the vascular surgeons. The patient returns today with a fresh problem having had blisters on his left lower extremity which have been there for about 5 days and he clearly states that he has been wearing his compression stockings as advised though he could not read the moderate compression and has been wearing light compression. Review of his electronic medical records note that he had lower extremity arterial duplex examination done on 06/23/2016 which showed no hemodynamically significant stenosis in the bilateral lower extremity arterial system. He also had a lower extremity venous reflux examination done on 07/07/2016 and it was noted that he had venous incompetence in the right great saphenous vein and bilateral common femoral veins. Patient was seen by Dr. Tamala Julian on the same day and for some reason his notes do not reflect the venous studies or the arterial studies and he recommended patient do a venous duplex ultrasound to look for reflux and return to see him.he would also consider a lymph pump if required. The patient was told that his workup  was normal and hence the patient  canceled his follow-up appointment. 02/03/17 on evaluation today patient left medial lower extremity blister appears to be doing about the same. It is still continuing to drain and there's still the blistered skin covering the wound bed which is making it difficult for the alternate to do its job. Fortunately there is no evidence of cellulitis. No fevers chills noted. Patient states in general he is not having any significant discomfort. Patient's lower extremity arterial duplex exam revealed that patient was hemodynamically stable with no evidence of stenosis in regard to the bilateral lower extremities. The lower extremity venous reflux exam revealed the patient had venous incontinence noted in the right greater saphenous and bilateral common femoral vein. There is no evidence of deep or superficial vein thrombosis in the bilateral lower extremities. Readmission: 11/12/18 Patient presents for evaluation our clinic today concerning issues that he is having with his left lower extremity. He tells me that a couple weeks ago he began developing blisters on the left lower extremity along with increased swelling. He typically wears his compression stockings on a regular basis is previously been evaluated both here as well is with vascular surgery they would recommend lymphedema pumps but unfortunately that somehow fell through and he never heard anything back from that. Nonetheless I think lymphedema pumps would be beneficial for this patient. He does have a history of hypertension and diabetes. Obviously the chronic venous stasis and lymphedema as well. At this point the blisters have been given in more trouble he states sometimes when the blisters openings able to clean it down with alcohol and it will dry out and do well. Unfortunately that has not been the case this time. He is having some discomfort although this mean these with cleaning the areas he doesn't have discomfort just on a regular basis. He  has not been able to wear his compression stockings since the blisters arose due to the fact that of course it will drain into the socks causing additional issues and he didn't have any way to wrap this otherwise. He has increased to taking his Lasix every day instead of every other day. He sees his primary care provider later this month as well. No fevers, chills, nausea, or vomiting noted at this time. 11/19/18-Patient returns at 1 week, per intake RN the amount of seepage into the compression wraps was definitely improved, overall all the wounds are measuring smaller but continuing silver alginate to the wounds as primary dressing 11/26/18 on evaluation today patient appears to be doing quite well in regard to his left lower [place] ulcers. In fact of the areas that were noted initially he only has two regions still open. There is no evidence of active infection at this time. He still is not heard anything from the company regarding lymphedema pumps as of yet. Again as previously seen vascular they have not recommended any surgical intervention. 12/03/2018 on evaluation today patient actually appears to be doing quite well with regard to his lower extremity ulcers. In fact most of the areas appear to be healed the one spot which does not seem to be completely healed I am unsure of whether or not this is really draining that much but nonetheless there does not appear to be any signs of infection or significant drainage at this point. There is no sign of fever, chills, nausea, vomiting, or diarrhea. Overall I am pleased with how things have progressed I think is very close to being able to transition  to his home compression stockings. ADVIK, [name] ([id number]) 12/10/2018 upon evaluation today patient appears to be doing quite well with regard to his left lower extremity. He has been tolerating the dressing changes without complication. Fortunately there is no signs of active infection at this time. He  appears after thorough evaluation of his leg to only have 1 small area that remains open at this point everything else appears to be almost completely closed. He still have significant swelling of the left lower extremity. We had discussed discussing this with his primary care provider he is not able to see her in person they were at the [facility] and right now the [place] is not seeing patients on site. According to the patient anyway. Subsequently he did speak with her apparently and his primary care provider feels that he may likely have a DVT. With that being said she has not seen his leg she is just going off of his history. Nonetheless that is a concern that the patient now has as well and while I do not feel the DVT is likely we can definitely ensure that that is not the case I will go ahead and see about putting that order in today. Nonetheless otherwise I am in a recommend that we continue with the current wound care measures including the compression therapy most likely. We just need to ensure that his leg is indeed free of any DVTs. 12/17/2018 on evaluation today patient actually appears to be completely healed today. He does have 2 very small areas of blistering although this is not anything too significant at this point which is good news. With that being said I am in agreement with the fact that I think he is completely healed at this point. He does want to get back into his compression stocking. The good news is we have gotten approval from insurance for his lymphedema pumps we received a letter since last saw him last week. The other good news is his study did come back and showed no evidence of a DVT. 12/20/2018 on evaluation today patient presents for follow-up concerning his ongoing issues with his left lower extremity. He was actually discharged last Friday and did fairly well until he states blisters opened this morning. He tells me he has been wearing his compression stocking although  he has a hard time getting this on. There does not appear to be any signs of active infection at this time. No fevers, chills, nausea, vomiting, or diarrhea. 12/27/2018 on evaluation today patient appears to be doing very well with regard to his swelling of the left lower extremity the 4 layer compression wrap seems to have been beneficial for him. Fortunately there is no signs of active infection at this time. Patient has been tolerating the compression wrap without complication and his foot swelling in particular appears to be greatly improved. He does still have a wound on the lateral portion of his left leg I believe this is more of a blister that has now reopened. 01/03/2019 on evaluation today patient actually appears to be doing excellent in regard to his left lower extremity. He did receive his compression pumps and is actually use this 7 times since he was last here in the office. On top of the compression wrap he is now roughly 3 cm better at the calf and 2 cm better at the ankle he also states that his foot seem to go an issue better without even having to use a shoe horn. Obviously I  think this is all evidence that he is doing excellent in this regard. The other good news is he does not appear to have anything open today as far as wounds are concerned. 01/15/2019 on evaluation today patient appears to be doing more poorly yet again with regard to his left lower extremity. He has developed new wounds again after being discharged just recently. Unfortunately this continues to be the case that he will heal and then have subsequent new wounds. The last time I was hopeful that he may not end up coming back too quickly especially since he states he has been using his lymphedema pumps along with wearing his compression. Nonetheless he had a blister on the back of his leg that popped up on the left and this has opened up into an ulceration it is quite painful. 01/22/19 on evaluation today patient  actually appears to be doing well with regard to his wound on the left lower extremity. He's been tolerating the dressing changes without complication including the compression wrap in the wound appears to be significantly smaller today which is great news. Overall very pleased in this regard. 01/29/2019 on evaluation today patient appears to be doing well with regard to his left posterior lower extremity ulcer. He has been tolerating the dressing changes without complication. This is not completely healed but is getting much closer. We did order a Farrow wrap 4000 for him he has received this and has it with him today although I am not sure we are quite ready to start him on that as of yet. We are very close. 02/05/2019 on evaluation today patient actually appears to be doing quite well with regard to his left posterior lower extremity ulcer. He still has a very tiny opening remaining but the fortunate thing is he seems to be healing quite nicely. He also did get his Farrow wrap which I am hoping will help with his edema control as well at home. Fortunately there is no evidence of active infection. 02/12/2019 patient and fortunately appears to be doing poorly in regard to his wounds of the left lower extremity. He was very close to healing therefore we attempted to use his Velcro compression wraps continuing with lymphedema pumps at home. Unfortunately that does not seem to have done very well for him. He tells me that he wore them all the time but again I am not sure why if that is the case that he is having such significant edema. He is still on his fluid pills as well. With that being said there is no obvious sign of infection although I do wonder about the possibility of infection at this time as well. 02/19/2019 unfortunately upon evaluation today patient appears to be doing more poorly with regard to his left lower extremity. He is not showing signs of significant improvement and I think the  biggest issue here is that he does have an infection that appears to likely be Pseudomonas. That is based on the blue-green drainage that were noted at this time. Unfortunately the antibiotic that has been on is not going to take care of this at all. I think they will get a need to switch him to either Levaquin or Cipro and this was discussed with the patient. 02/26/2019 on evaluation today patient's lower extremity on the left appears to be doing significantly better as compared to last evaluation. Fortunately there is no signs of active infection at this time. He has been tolerating the compression wrap without complication in  fact he made it the whole week at this point. He is showing signs of excellent improvement I am very happy in this regard. With that being said he is having some issues with infection we did review the results of his culture which I noted today. He did have a positive finding for Enterobacter as well as Alcaligenes faecalis. Fortunately the Levaquin that I placed him on will work for both which is great news. There is no signs of systemic infection at this point. 10/30; left posterior leg wound in the setting of very significant edema and what looks like chronic venous inflammation. He has compression pumps but does not use them. We have been using 3 layer compression. Silver alginate to the wound as the primary dressing 03/18/2019 on evaluation today patient appears to be doing a little better compared to last time I saw him. He really has not been using his compression pumps he tells me that he is having too much discomfort. He has been keeping his wraps on however. He is only been taking his fluid pills every other day because he states they are not really helping and he has an appointment with his primary care provider at the [facility] tomorrow. Subsequently the wound itself on the left lower extremity does seem to be greatly improved compared to previous. 03/25/2019 on evaluation  today patient appears to be doing better with regard to his wounds on the bilateral lower extremities. The left is doing excellent the right is also doing better although both still do show some signs of open wounds noted at this point unfortunately. Fortunately there is no signs of active infection at this time. The patient also is not really having any significant pain which is good news. Unfortunately there was some confusion with the referral on vascular disease and as far as getting the patient scheduled there can be contacting him later today to do this [name], [name] ([id number]) fortunately we got this straightened out. 04/01/2019 on evaluation today patient appears to be doing no fevers, chills, nausea, vomiting, or diarrhea. Excellent at this time with regard to his lower extremities. There does not appear to be any open wound at this point which is good news. Fortunately is also no signs of active infection at this time. Overall feel like the patient has done excellent with the compression the problem is every time we got him to this point and then subsequently go to using his own compression things just go right back to where they were. I am not sure how to address this we can try to get an appointment with vascular for 2 weeks now they have yet to call him. Obviously this has become frustrating for the patient as well. I think the issue has just been an honest error as far as scheduling is concerned but nonetheless still worn out the point where I am unsure of which direction we should take. 04/08/2019 on evaluation today patient actually appears to be doing well with regard to his lower extremities. There are no open wounds at this time and things seem to be managing quite nicely as far as the overall edema control is concerned. With that being said he does have his compression socks today for [place] to go ahead and reinitiate therapy in that manner at this point. He is going to be going for  shoes to be measured on Wednesday and then coincidentally he will also be seeing vascular on Thursday. Overall I think this is good news and  again I am hopeful that they will be able to do something for him to help prevent ongoing issues with edema control as well. No fevers, chills, nausea, vomiting, or diarrhea. 04/11/2019 on evaluation today patient actually appears to be doing poorly after just being discharged on Monday of this week. He had been experiencing issues with again blisters especially on the left lower extremity. With that being said he was completely healed and appeared to be doing great this past Monday. He then subsequently has new blisters that formed before his appointment with vascular this morning. He was also measured for shoes in the interim. With that being said we may have figured out what exactly is going on and why he continues to have issues like what we are seeing at this point. He takes his compression stockings off at nighttime and then he ends up having to sleep in his chair for 5-6 hours a night. He sleeps with his feet down he cannot really get him up in the recliner and therefore he is sleeping and the worst possible his position with his feet on the floor for that majority of the time. Again as I explained to him that is about one third at minimum at least one fourth of his day that he spending with his feet dangling down on the ground and the worst possible position they could be. I think this may be what is causing the issue. Subsequently I am leaning toward thinking that he may need a hospital bed in order to elevate his legs. We likely can have to coordinate this with his primary care provider at the [facility]. Readmission: 01/26/2021 this is a patient who presents for repeat evaluation here in the clinic although it is actually been couple of years since have seen him in fact it was December 2020 when I last saw him. Subsequently he never really healed but  did end up being lost to follow-up. He tells me has been having issues ongoing with his lower extremities has bilateral lower extremity lymphedema no real significant or definitive open wounds but in general his lymphedema is way out of control. We were never able to refer him to lymphedema clinic simply due to the fact to be honest we were never able to get him completely healed. I do not see anyone with open wounds. The patient does have evidence of type 2 diabetes mellitus, lymphedema, chronic venous insufficiency, and hypertension. That really has not changed since his last evaluation. 02/09/2021 upon evaluation today patient appears to be doing a little better in regard to his legs although he still having a tremendous amount of drainage especially on the left leg. Fortunately there does not appear to be any evidence of active infection. Of note when we looked into this further it appears that the patient did not have any absorptive dressing on it was just the 4-layer compression wrap. Nonetheless this is probably big part of the issue here. 10/10; he comes in today with 3 large areas on the upper right lower leg likely remanence of denuded blistering under his compression wraps. He has no other wounds on the right. On the left he has the denuded area on the left medial foot and ankle and on the left dorsal foot. Massive lymphedema in both feet dorsally. Using Zetuvit under compression We have increased home health visitation to twice a week to change the dressings and will change it once 02/22/2021 upon evaluation today patient appears to be doing well currently  with regard to his wounds. He has been tolerating the dressing changes without complication. Fortunately there does not appear to be any evidence of active infection which is great news. No fevers, chills, nausea, vomiting, or diarrhea. The biggest issue I see currently is that home health is not putting any medicine on the actual wounds  before wrapping. 03/01/2021 upon evaluation today the patient's right leg actually appears to be doing quite well which is great news there does not appear to be any evidence of active infection at this time. No fevers, chills, nausea, vomiting, or diarrhea. With that being said the patient is having issues on the left foot where he is having significant drainage is also an ammonia smell he does not have any animals at home and this makes me concerned about a bacteria producing urea as a byproduct. Again the possible common organisms will be E. coli, Proteus, and Enterococcus. All 3 of which can be successfully treated with Levaquin. For that reason I think that this may be a good option for [place] to consider placing him on and I did obtain a culture as well for confirmation sake. 03/08/2021 upon evaluation today patient appears to be doing unfortunately still somewhat poorly in regard to his leg ulcerations. He actually has an area on the right leg where he blistered due to the fact that his wrap slid down and caused an area of pinching on his skin and this has led to a significant issue here. 03/15/2021 upon evaluation today patient unfortunately has not been wrapped appropriately with absorptive dressings nor with the appropriate technique for the third layer of the 4-layer compression wrap. These are issues that we continue to try to address with the home health nurse. Also the absorptive dressing that she had was cut in half and therefore that causes things to leak out it does not actually trap the fluid in regard to the top of the foot overall I think that all these combined are really not seeing things improved significantly here. Fortunately there does not appear to be any signs of significant infection at this time which is good news. He still is having a tremendous amount of drainage. 03/22/2021 upon evaluation today patient appears to be draining tremendously. He still continues to tell me  that he is using his pumps 2 times a day and that coupled with that tells me that he is elevating his legs as well. With that being said all things considered I am really just not seeing the improvement we would expect to see with the 4-layer compression wrap and all the above noted. He in fact had an extremely large Zetuvit dressing on both legs and that they were extremely filled to the max with fluid. This is after just being changed just before the weekend and this is Monday. Nonetheless I am concerned about the fact that there is something going on fairly significant that we cannot get any of this under control and that he is draining this significantly. He supposed be having an echocardiogram it sounds like scheduling has been an issue for him as far as getting in sooner. Its something to do with needing his cousin to drive him because of where it sat and he cannot drive himself to this appointment either way I really think he needs to try to see what he can do about making this happen a little sooner. He tells me he will call today. [name], [name] ([id number]) 03/29/2021 on evaluation today patient appears to be  doing about the same in regard to his legs. He did get his cardiology appointment moved up to 6 December which is at least good that is better than what it was before mid December. Overall very pleased in that regard. 04/05/2021 upon evaluation today patient unfortunately is still doing fairly poorly. There does not appear to be any signs of active infection at this time. No fevers, chills, nausea, vomiting, or diarrhea. Unfortunately I think until his edema is under control and overall fluid overload there is really not to be much chance that I can do much to get him better. This is quite unfortunate and frustrating both for myself and the patient to be perfectly honest. Nonetheless I think that he really needs to have a conversation both with his primary care provider as well as  cardiologist he sees the PCP on Monday and cardiology on Wednesday of next week. 04/13/2021 upon evaluation today patient appears to be doing poorly in regard to his bilateral lower extremities his left is still worse than the right. With that being said he has a tremendous amount of drainage he did see his primary care provider yesterday there really was not much there to be done from their perspective. He sees cardiology tomorrow. Nonetheless my biggest concern here is simply that if we do not get the edema under control he is going to continue to have drainage and honestly I think at some point he is going to become infected severely that is my main concern. 04/19/2021 upon evaluation today patient appears to be doing poorly still in regard to his legs. Unfortunately there does not appear to be any signs of infection at this point. He does have a tremendous amount of drainage however. We have not seen the results back from the cardiologist and the echocardiogram that was done. It appears that the patient checked out okay as far as that is concerned with regard to ejection fraction though we still have some issues here to be honest with his diastolic function. I am unsure if this is accounting for everything that we are seeing or not. Either way he has a tremendous amount of drainage from his legs that we are just not able to control in the outpatient setting at this point. I have reached out to Dr. Rockey Situ his cardiologist to see once he reviews the sheet if there is anything that he feels like can be done from an outpatient perspective if not then I think the way to go is probably can to be through inpatient admission and diuresis. Otherwise I am not sure how working to get this under control we tried antibiotics, compression wrapping, and I have told the patient to be elevating his legs I am not sure how much he does of this but either way I think that this is still an ongoing issue  nonetheless. 04/26/2021 upon evaluation today patient appears to be doing poorly in regard to his legs. He is having a tremendous amount of fluid at this point which is quite unfortunate. Its to the point that he may have had at least 5 to 10 pounds of fluid in his dressings this morning when they were removed these were changed this Friday. Subsequently I think he needs to go to the ER for further evaluation and treatment I think is probably can need diuresis possibly even IV antibiotics been on what the blood work looks like but in general I feel like he needs something to get this under control from  an outpatient perspective absent of everywhere I can think of and I cannot get this under control with our traditional measures. I think this is going require more so that we can get him better 12/30; this is a patient with severe bilateral lymphedema. He was hospitalized from 04/26/2021 through 04/29/2021 treated for cellulitis in the setting of lower extremity ulcers and lymphedema. After he left the hospital he is apparently seen for nurse visit our staff contacted cardiology and he has been started on Lasix 40 mg. Apparently his legs have less edema. Lab work from 05/04/2021 showed a BUN of 38 and creatinine of 1.59 these are elevated versus previous where his creatinine seems to have been 1.30 on 12/19 his potassium is 4.3. I believe the lab work is being followed by cardiology We have him in a 4-layer wrap. Xeroform on the leg wounds and sit to fit on the Berry damage skin on the left dorsal foot versus right dorsal foot. He has compression pumps but does not use them. We have apparently not yet ordered him compression stockings 05/17/2021 upon evaluation today patient's legs though better than last time I personally saw him appear to be getting worse compared to where they were previous. Dr. Quentin Cornwall was actually last 1 to see you I have not seen him since 19 December. That was before he went into  the ER. Coming out apparently his legs looked also and they still look better but not as good as they were in the past. 1/16; patient with severe bilateral lymphedema. Severe scaled hyperkeratotic skin on the dorsal aspect of his distal left foot and left medial ankle.. On the right side changes are not as bad. He did not have any weeping edema. Our intake nurse was convinced that he is being compliant with compression pumps 1 hour twice a day 05/31/2021 upon evaluation today patient actually appears to be doing a little bit better in my opinion in regard to his feet. I do not see as much drainage and it being just completely wet as it was previous. Fortunately I do not also see any signs of active infection which is great news as well. 06/07/21 Upon inspection patient's wound bed actually showed signs of doing well he is not nearly as weepy and wet as he has been in the past and overall very pleased in that regard. Fortunately I do not see any signs of active infection locally or nor systemically at this time. Which is great news. No fevers, chills, nausea, vomiting, or diarrhea. 06/14/2021 upon evaluation today patient appears to be doing well with regard to his right foot I am pleased in that regard. His left foot is still draining quite a bit despite using lymphedema pumps, 4-layer compression wraps, and he tells me elevating his legs as well. He also has Lasix that he takes twice a day. Nonetheless I believe that this is still good to be an ongoing issue. We have a hard time getting this under control as far as the swelling is concerned. Electronic Signature(s) Signed: 06/14/2021 5:09:23 PM By: Worthy Keeler PA-C Entered By: Worthy Keeler on 06/14/2021 17:09:23 [name] ([id number]) -------------------------------------------------------------------------------- Physical Exam Details Patient Name: [name] Date of Service: 06/14/2021 3:45 PM Medical Record Number: [id number] Patient  Account Number: [id number] Date of Birth/Sex: Nov 22, 1950 (71 y.o. M) Treating RN: Levora Dredge Primary Care Provider: Felipa Eth Other Clinician: Referring Provider: Felipa Eth Treating Provider/Extender: Skipper Cliche in Treatment: 19 Constitutional Obese and well-hydrated in no  acute distress. Respiratory normal breathing without difficulty. Psychiatric this patient is able to make decisions and demonstrates good insight into disease process. Alert and Oriented x 3. pleasant and cooperative. Notes Upon inspection patient's wound bed actually showed signs of good granulation and epithelization at this point. Fortunately I do not see any signs of infection at this time which is great news. I still do not believe he needs any antibiotics based on what I am seeing. Nonetheless this is still continue to be a significant problem here as far as the weeping of the left foot is concerned and I am not really sure what to do about getting this under better control. I really exhausted pretty much every means that we have. Electronic Signature(s) Signed: 06/14/2021 5:09:54 PM By: Worthy Keeler PA-C Entered By: Worthy Keeler on 06/14/2021 17:09:54 [name] ([id number]) -------------------------------------------------------------------------------- Physician Orders Details Patient Name: [name] Date of Service: 06/14/2021 3:45 PM Medical Record Number: [id number] Patient Account Number: [id number] Date of Birth/Sex: 1950/09/03 (72 y.o. M) Treating RN: Levora Dredge Primary Care Provider: Felipa Eth Other Clinician: Referring Provider: Felipa Eth Treating Provider/Extender: Skipper Cliche in Treatment: 61 Verbal / Phone Orders: No Diagnosis Coding ICD-10 Coding Code Description E11.622 Type 2 diabetes mellitus with other skin ulcer I89.0 Lymphedema, not elsewhere classified I87.2 Venous insufficiency (chronic) (peripheral) L97.822 Non-pressure  chronic ulcer of other part of left lower leg with fat layer exposed L97.512 Non-pressure chronic ulcer of other part of right foot with fat layer exposed I10 Essential (primary) hypertension L97.811 Non-pressure chronic ulcer of other part of right lower leg limited to breakdown of skin Follow-up Appointments o Return Appointment in 1 week. o Nurse Visit as needed [place] for wound care. May utilize formulary equivalent dressing for wound treatment orders unless otherwise specified. Home Health Nurse may visit PRN to address patientos wound care needs. - frequency 3 times per week - patient will be seen once at wound center - home health to see patient 2 times per week o Scheduled days for dressing changes to be completed; exception, patient has scheduled wound care visit that day. o **Please direct any NON-WOUND related issues/requests for orders to patient's Primary Care Physician. **If current dressing causes regression in wound condition, may D/C ordered dressing product/s and apply Normal Saline Moist Dressing daily until next [place] or Other MD appointment. **Notify Wound Healing Center of regression in wound condition at [phone]. Bathing/ Shower/ Hygiene o May shower with wound dressing protected with water repellent cover or cast protector. o No tub bath. Edema Control - Lymphedema / Segmental Compressive Device / Other Bilateral Lower Extremities o Optional: One layer of unna paste to top of compression wrap (to act as an anchor). - Unna paste on calf to secure wrap in place as needed o 4 Layer Compression System Lymphedema. - bilateral legs bi lat, nystatin powder between toes on bilateral toes, 2x2 gauze between toes only needed on left side, apply zetuvits/ or formulary for extra absorbant dressing around ankles and foot area, use ABD to cover toes; 3 times per week COTTON LAYER  SPIRAL, LIGHT TAN SPIRAL. WHITE WITH YELLOW LINE FIGURE 8 , COBAN SPIRAL o Elevate, Exercise Daily and Avoid Standing for Long Periods of Time. o Elevate legs to the level of the heart and pump ankles as often as possible o Elevate leg(s) parallel to the floor when sitting. o Compression Pump: Use compression pump on  left lower extremity for 60 minutes, twice daily. - 2 times per day o DO YOUR BEST to sleep in the bed at night. DO NOT sleep in your recliner. Long hours of sitting in a recliner leads to swelling of the legs and/or potential wounds on your backside. o Other: - Contact prescriber regarding use of diuretics to reduce fluid overload. Off-Loading Bilateral Lower Extremities o Open toe surgical shoe - [facility] appt to fit for footwear 06/08/21 o Turn and reposition every 2 hours Additional Orders / Instructions o Follow Nutritious Diet and Increase Protein Intake Patient Medications [name], [name] ([id number]) Allergies: No Known Drug Allergies Notifications Medication Indication Start End nystatin 06/14/2021 DOSE topical 100,000 unit/gram powder - powder topical applied 3 times per week with each dressing change to the toes bilateral x 30 days Electronic Signature(s) Signed: 06/14/2021 5:11:53 PM By: Worthy Keeler PA-C Entered By: Worthy Keeler on 06/14/2021 17:11:53 [name] ([id number]) -------------------------------------------------------------------------------- Problem List Details Patient Name: [name] Date of Service: 06/14/2021 3:45 PM Medical Record Number: [id number] Patient Account Number: [id number] Date of Birth/Sex: 28-Nov-1950 (71 y.o. M) Treating RN: Levora Dredge Primary Care Provider: Felipa Eth Other Clinician: Referring Provider: Felipa Eth Treating Provider/Extender: Skipper Cliche in Treatment: 19 Active Problems ICD-10 Encounter Code Description Active Date MDM Diagnosis E11.622 Type 2 diabetes  mellitus with other skin ulcer 01/26/2021 No Yes I89.0 Lymphedema, not elsewhere classified 01/26/2021 No Yes I87.2 Venous insufficiency (chronic) (peripheral) 01/26/2021 No Yes L97.822 Non-pressure chronic ulcer of other part of left lower leg with fat layer 01/26/2021 No Yes exposed L97.512 Non-pressure chronic ulcer of other part of right foot with fat layer 01/26/2021 No Yes exposed [place] (primary) hypertension 01/26/2021 No Yes L97.811 Non-pressure chronic ulcer of other part of right lower leg limited to 02/15/2021 No Yes breakdown of skin Inactive Problems Resolved Problems Electronic Signature(s) Signed: 06/14/2021 3:48:07 PM By: Worthy Keeler PA-C Entered By: Worthy Keeler on 06/14/2021 15:48:06 [name] ([id number]) -------------------------------------------------------------------------------- Progress Note Details Patient Name: [name] Date of Service: 06/14/2021 3:45 PM Medical Record Number: [id number] Patient Account Number: [id number] Date of Birth/Sex: 12-30-1950 (71 y.o. M) Treating RN: Levora Dredge Primary Care Provider: Felipa Eth Other Clinician: Referring Provider: Felipa Eth Treating Provider/Extender: Skipper Cliche in Treatment: 19 Subjective Chief Complaint Information obtained from Patient Left LE ulcers History of Present Illness (HPI) 71 year old male who presented to the ER with bilateral lower extremity blisters which had started last week. he has a past medical history of leukemia, diabetes mellitus, hypertension, edema of both lower extremities, his recurrent skin infections, peripheral vascular disease, coronary artery disease, congestive heart failure and peripheral neuropathy. in the ER he was given Rocephin and put on Silvadene cream. he was put on oral doxycycline and was asked to follow-up with the [facility]. His last hemoglobin A1c was 6.6 in December and he checks his blood sugar once a week. He does not have  any physicians outside the [place] system. He does not recall any vascular duplex studies done either for arterial or venous disease but was told to wear compression stockings which he does not use 05/30/2016 -- we have not yet received any of his notes from the [facility] hospital system and his arterial and venous duplex studies are scheduled here in [place] around mid February. We are unable to have his insurance accepted by home health agencies and hence he is getting dressings only once a week. 06/06/16 -- -- I received a call from the  patient's PCP at the [place] at [facility] and spoke to Dr. Garvin Fila, phone number [phone] and fax number [phone]. She confirmed that no vascular testing was done over the last 5 years and she would be happy to do them if the patient did want them to be done at the [place] and we could fax him a request. Readmission: 71 year old male seen by as in February of this year and was referred to vein and vascular for studies and opinion from the vascular surgeons. The patient returns today with a fresh problem having had blisters on his left lower extremity which have been there for about 5 days and he clearly states that he has been wearing his compression stockings as advised though he could not read the moderate compression and has been wearing light compression. Review of his electronic medical records note that he had lower extremity arterial duplex examination done on 06/23/2016 which showed no hemodynamically significant stenosis in the bilateral lower extremity arterial system. He also had a lower extremity venous reflux examination done on 07/07/2016 and it was noted that he had venous incompetence in the right great saphenous vein and bilateral common femoral veins. Patient was seen by Dr. Tamala Julian on the same day and for some reason his notes do not reflect the venous studies or the arterial studies and he recommended patient do a venous duplex ultrasound to look  for reflux and return to see him.he would also consider a lymph pump if required. The patient was told that his workup was normal and hence the patient canceled his follow-up appointment. 02/03/17 on evaluation today patient left medial lower extremity blister appears to be doing about the same. It is still continuing to drain and there's still the blistered skin covering the wound bed which is making it difficult for the alternate to do its job. Fortunately there is no evidence of cellulitis. No fevers chills noted. Patient states in general he is not having any significant discomfort. Patient's lower extremity arterial duplex exam revealed that patient was hemodynamically stable with no evidence of stenosis in regard to the bilateral lower extremities. The lower extremity venous reflux exam revealed the patient had venous incontinence noted in the right greater saphenous and bilateral common femoral vein. There is no evidence of deep or superficial vein thrombosis in the bilateral lower extremities. Readmission: 11/12/18 Patient presents for evaluation our clinic today concerning issues that he is having with his left lower extremity. He tells me that a couple weeks ago he began developing blisters on the left lower extremity along with increased swelling. He typically wears his compression stockings on a regular basis is previously been evaluated both here as well is with vascular surgery they would recommend lymphedema pumps but unfortunately that somehow fell through and he never heard anything back from that. Nonetheless I think lymphedema pumps would be beneficial for this patient. He does have a history of hypertension and diabetes. Obviously the chronic venous stasis and lymphedema as well. At this point the blisters have been given in more trouble he states sometimes when the blisters openings able to clean it down with alcohol and it will dry out and do well. Unfortunately that has not been  the case this time. He is having some discomfort although this mean these with cleaning the areas he doesn't have discomfort just on a regular basis. He has not been able to wear his compression stockings since the blisters arose due to the fact that of course it will drain  into the socks causing additional issues and he didn't have any way to wrap this otherwise. He has increased to taking his Lasix every day instead of every other day. He sees his primary care provider later this month as well. No fevers, chills, nausea, or vomiting noted at this time. 11/19/18-Patient returns at 1 week, per intake RN the amount of seepage into the compression wraps was definitely improved, overall all the wounds are measuring smaller but continuing silver alginate to the wounds as primary dressing 11/26/18 on evaluation today patient appears to be doing quite well in regard to his left lower [place] ulcers. In fact of the areas that were noted initially he only has two regions still open. There is no evidence of active infection at this time. He still is not heard anything from the company regarding lymphedema pumps as of yet. Again as previously seen vascular they have not recommended any surgical intervention. [name], [name] ([id number]) 12/03/2018 on evaluation today patient actually appears to be doing quite well with regard to his lower extremity ulcers. In fact most of the areas appear to be healed the one spot which does not seem to be completely healed I am unsure of whether or not this is really draining that much but nonetheless there does not appear to be any signs of infection or significant drainage at this point. There is no sign of fever, chills, nausea, vomiting, or diarrhea. Overall I am pleased with how things have progressed I think is very close to being able to transition to his home compression stockings. 12/10/2018 upon evaluation today patient appears to be doing quite well with regard to his left  lower extremity. He has been tolerating the dressing changes without complication. Fortunately there is no signs of active infection at this time. He appears after thorough evaluation of his leg to only have 1 small area that remains open at this point everything else appears to be almost completely closed. He still have significant swelling of the left lower extremity. We had discussed discussing this with his primary care provider he is not able to see her in person they were at the [facility] and right now the [place] is not seeing patients on site. According to the patient anyway. Subsequently he did speak with her apparently and his primary care provider feels that he may likely have a DVT. With that being said she has not seen his leg she is just going off of his history. Nonetheless that is a concern that the patient now has as well and while I do not feel the DVT is likely we can definitely ensure that that is not the case I will go ahead and see about putting that order in today. Nonetheless otherwise I am in a recommend that we continue with the current wound care measures including the compression therapy most likely. We just need to ensure that his leg is indeed free of any DVTs. 12/17/2018 on evaluation today patient actually appears to be completely healed today. He does have 2 very small areas of blistering although this is not anything too significant at this point which is good news. With that being said I am in agreement with the fact that I think he is completely healed at this point. He does want to get back into his compression stocking. The good news is we have gotten approval from insurance for his lymphedema pumps we received a letter since last saw him last week. The other good news is his  study did come back and showed no evidence of a DVT. 12/20/2018 on evaluation today patient presents for follow-up concerning his ongoing issues with his left lower extremity. He was  actually discharged last Friday and did fairly well until he states blisters opened this morning. He tells me he has been wearing his compression stocking although he has a hard time getting this on. There does not appear to be any signs of active infection at this time. No fevers, chills, nausea, vomiting, or diarrhea. 12/27/2018 on evaluation today patient appears to be doing very well with regard to his swelling of the left lower extremity the 4 layer compression wrap seems to have been beneficial for him. Fortunately there is no signs of active infection at this time. Patient has been tolerating the compression wrap without complication and his foot swelling in particular appears to be greatly improved. He does still have a wound on the lateral portion of his left leg I believe this is more of a blister that has now reopened. 01/03/2019 on evaluation today patient actually appears to be doing excellent in regard to his left lower extremity. He did receive his compression pumps and is actually use this 7 times since he was last here in the office. On top of the compression wrap he is now roughly 3 cm better at the calf and 2 cm better at the ankle he also states that his foot seem to go an issue better without even having to use a shoe horn. Obviously I think this is all evidence that he is doing excellent in this regard. The other good news is he does not appear to have anything open today as far as wounds are concerned. 01/15/2019 on evaluation today patient appears to be doing more poorly yet again with regard to his left lower extremity. He has developed new wounds again after being discharged just recently. Unfortunately this continues to be the case that he will heal and then have subsequent new wounds. The last time I was hopeful that he may not end up coming back too quickly especially since he states he has been using his lymphedema pumps along with wearing his compression. Nonetheless he had  a blister on the back of his leg that popped up on the left and this has opened up into an ulceration it is quite painful. 01/22/19 on evaluation today patient actually appears to be doing well with regard to his wound on the left lower extremity. He's been tolerating the dressing changes without complication including the compression wrap in the wound appears to be significantly smaller today which is great news. Overall very pleased in this regard. 01/29/2019 on evaluation today patient appears to be doing well with regard to his left posterior lower extremity ulcer. He has been tolerating the dressing changes without complication. This is not completely healed but is getting much closer. We did order a Farrow wrap 4000 for him he has received this and has it with him today although I am not sure we are quite ready to start him on that as of yet. We are very close. 02/05/2019 on evaluation today patient actually appears to be doing quite well with regard to his left posterior lower extremity ulcer. He still has a very tiny opening remaining but the fortunate thing is he seems to be healing quite nicely. He also did get his Farrow wrap which I am hoping will help with his edema control as well at home. Fortunately there is no evidence  of active infection. 02/12/2019 patient and fortunately appears to be doing poorly in regard to his wounds of the left lower extremity. He was very close to healing therefore we attempted to use his Velcro compression wraps continuing with lymphedema pumps at home. Unfortunately that does not seem to have done very well for him. He tells me that he wore them all the time but again I am not sure why if that is the case that he is having such significant edema. He is still on his fluid pills as well. With that being said there is no obvious sign of infection although I do wonder about the possibility of infection at this time as well. 02/19/2019 unfortunately upon evaluation  today patient appears to be doing more poorly with regard to his left lower extremity. He is not showing signs of significant improvement and I think the biggest issue here is that he does have an infection that appears to likely be Pseudomonas. That is based on the blue-green drainage that were noted at this time. Unfortunately the antibiotic that has been on is not going to take care of this at all. I think they will get a need to switch him to either Levaquin or Cipro and this was discussed with the patient. 02/26/2019 on evaluation today patient's lower extremity on the left appears to be doing significantly better as compared to last evaluation. Fortunately there is no signs of active infection at this time. He has been tolerating the compression wrap without complication in fact he made it the whole week at this point. He is showing signs of excellent improvement I am very happy in this regard. With that being said he is having some issues with infection we did review the results of his culture which I noted today. He did have a positive finding for Enterobacter as well as Alcaligenes faecalis. Fortunately the Levaquin that I placed him on will work for both which is great news. There is no signs of systemic infection at this point. 10/30; left posterior leg wound in the setting of very significant edema and what looks like chronic venous inflammation. He has compression pumps but does not use them. We have been using 3 layer compression. Silver alginate to the wound as the primary dressing 03/18/2019 on evaluation today patient appears to be doing a little better compared to last time I saw him. He really has not been using his compression pumps he tells me that he is having too much discomfort. He has been keeping his wraps on however. He is only been taking his fluid pills every other day because he states they are not really helping and he has an appointment with his primary care provider at  the [facility] tomorrow. Subsequently the wound itself on the left lower extremity does seem to be greatly improved compared to previous. [name], [name] ([id number]) 03/25/2019 on evaluation today patient appears to be doing better with regard to his wounds on the bilateral lower extremities. The left is doing excellent the right is also doing better although both still do show some signs of open wounds noted at this point unfortunately. Fortunately there is no signs of active infection at this time. The patient also is not really having any significant pain which is good news. Unfortunately there was some confusion with the referral on vascular disease and as far as getting the patient scheduled there can be contacting him later today to do this fortunately we got this straightened out. 04/01/2019 on evaluation  today patient appears to be doing no fevers, chills, nausea, vomiting, or diarrhea. Excellent at this time with regard to his lower extremities. There does not appear to be any open wound at this point which is good news. Fortunately is also no signs of active infection at this time. Overall feel like the patient has done excellent with the compression the problem is every time we got him to this point and then subsequently go to using his own compression things just go right back to where they were. I am not sure how to address this we can try to get an appointment with vascular for 2 weeks now they have yet to call him. Obviously this has become frustrating for the patient as well. I think the issue has just been an honest error as far as scheduling is concerned but nonetheless still worn out the point where I am unsure of which direction we should take. 04/08/2019 on evaluation today patient actually appears to be doing well with regard to his lower extremities. There are no open wounds at this time and things seem to be managing quite nicely as far as the overall edema control is concerned. With  that being said he does have his compression socks today for [place] to go ahead and reinitiate therapy in that manner at this point. He is going to be going for shoes to be measured on Wednesday and then coincidentally he will also be seeing vascular on Thursday. Overall I think this is good news and again I am hopeful that they will be able to do something for him to help prevent ongoing issues with edema control as well. No fevers, chills, nausea, vomiting, or diarrhea. 04/11/2019 on evaluation today patient actually appears to be doing poorly after just being discharged on Monday of this week. He had been experiencing issues with again blisters especially on the left lower extremity. With that being said he was completely healed and appeared to be doing great this past Monday. He then subsequently has new blisters that formed before his appointment with vascular this morning. He was also measured for shoes in the interim. With that being said we may have figured out what exactly is going on and why he continues to have issues like what we are seeing at this point. He takes his compression stockings off at nighttime and then he ends up having to sleep in his chair for 5-6 hours a night. He sleeps with his feet down he cannot really get him up in the recliner and therefore he is sleeping and the worst possible his position with his feet on the floor for that majority of the time. Again as I explained to him that is about one third at minimum at least one fourth of his day that he spending with his feet dangling down on the ground and the worst possible position they could be. I think this may be what is causing the issue. Subsequently I am leaning toward thinking that he may need a hospital bed in order to elevate his legs. We likely can have to coordinate this with his primary care provider at the [facility]. Readmission: 01/26/2021 this is a patient who presents for repeat evaluation here in the clinic  although it is actually been couple of years since have seen him in fact it was December 2020 when I last saw him. Subsequently he never really healed but did end up being lost to follow-up. He tells me has been having issues  ongoing with his lower extremities has bilateral lower extremity lymphedema no real significant or definitive open wounds but in general his lymphedema is way out of control. We were never able to refer him to lymphedema clinic simply due to the fact to be honest we were never able to get him completely healed. I do not see anyone with open wounds. The patient does have evidence of type 2 diabetes mellitus, lymphedema, chronic venous insufficiency, and hypertension. That really has not changed since his last evaluation. 02/09/2021 upon evaluation today patient appears to be doing a little better in regard to his legs although he still having a tremendous amount of drainage especially on the left leg. Fortunately there does not appear to be any evidence of active infection. Of note when we looked into this further it appears that the patient did not have any absorptive dressing on it was just the 4-layer compression wrap. Nonetheless this is probably big part of the issue here. 10/10; he comes in today with 3 large areas on the upper right lower leg likely remanence of denuded blistering under his compression wraps. He has no other wounds on the right. On the left he has the denuded area on the left medial foot and ankle and on the left dorsal foot. Massive lymphedema in both feet dorsally. Using Zetuvit under compression We have increased home health visitation to twice a week to change the dressings and will change it once 02/22/2021 upon evaluation today patient appears to be doing well currently with regard to his wounds. He has been tolerating the dressing changes without complication. Fortunately there does not appear to be any evidence of active infection which is great  news. No fevers, chills, nausea, vomiting, or diarrhea. The biggest issue I see currently is that home health is not putting any medicine on the actual wounds before wrapping. 03/01/2021 upon evaluation today the patient's right leg actually appears to be doing quite well which is great news there does not appear to be any evidence of active infection at this time. No fevers, chills, nausea, vomiting, or diarrhea. With that being said the patient is having issues on the left foot where he is having significant drainage is also an ammonia smell he does not have any animals at home and this makes me concerned about a bacteria producing urea as a byproduct. Again the possible common organisms will be E. coli, Proteus, and Enterococcus. All 3 of which can be successfully treated with Levaquin. For that reason I think that this may be a good option for [place] to consider placing him on and I did obtain a culture as well for confirmation sake. 03/08/2021 upon evaluation today patient appears to be doing unfortunately still somewhat poorly in regard to his leg ulcerations. He actually has an area on the right leg where he blistered due to the fact that his wrap slid down and caused an area of pinching on his skin and this has led to a significant issue here. 03/15/2021 upon evaluation today patient unfortunately has not been wrapped appropriately with absorptive dressings nor with the appropriate technique for the third layer of the 4-layer compression wrap. These are issues that we continue to try to address with the home health nurse. Also the absorptive dressing that she had was cut in half and therefore that causes things to leak out it does not actually trap the fluid in regard to the top of the foot overall I think that all these combined are really  not seeing things improved significantly here. Fortunately there does not appear to be any signs of significant infection at this time which is good news. He  still is having a tremendous amount of drainage. 03/22/2021 upon evaluation today patient appears to be draining tremendously. He still continues to tell me that he is using his pumps 2 times a day and that coupled with that tells me that he is elevating his legs as well. With that being said all things considered I am really just not seeing the improvement we would expect to see with the 4-layer compression wrap and all the above noted. He in fact had an extremely large Zetuvit dressing on both legs and that they were extremely filled to the max with fluid. This is after just being changed just before the weekend and this [name], [name] ([id number]) is Monday. Nonetheless I am concerned about the fact that there is something going on fairly significant that we cannot get any of this under control and that he is draining this significantly. He supposed be having an echocardiogram it sounds like scheduling has been an issue for him as far as getting in sooner. Its something to do with needing his cousin to drive him because of where it sat and he cannot drive himself to this appointment either way I really think he needs to try to see what he can do about making this happen a little sooner. He tells me he will call today. 03/29/2021 on evaluation today patient appears to be doing about the same in regard to his legs. He did get his cardiology appointment moved up to 6 December which is at least good that is better than what it was before mid December. Overall very pleased in that regard. 04/05/2021 upon evaluation today patient unfortunately is still doing fairly poorly. There does not appear to be any signs of active infection at this time. No fevers, chills, nausea, vomiting, or diarrhea. Unfortunately I think until his edema is under control and overall fluid overload there is really not to be much chance that I can do much to get him better. This is quite unfortunate and frustrating both for myself  and the patient to be perfectly honest. Nonetheless I think that he really needs to have a conversation both with his primary care provider as well as cardiologist he sees the PCP on Monday and cardiology on Wednesday of next week. 04/13/2021 upon evaluation today patient appears to be doing poorly in regard to his bilateral lower extremities his left is still worse than the right. With that being said he has a tremendous amount of drainage he did see his primary care provider yesterday there really was not much there to be done from their perspective. He sees cardiology tomorrow. Nonetheless my biggest concern here is simply that if we do not get the edema under control he is going to continue to have drainage and honestly I think at some point he is going to become infected severely that is my main concern. 04/19/2021 upon evaluation today patient appears to be doing poorly still in regard to his legs. Unfortunately there does not appear to be any signs of infection at this point. He does have a tremendous amount of drainage however. We have not seen the results back from the cardiologist and the echocardiogram that was done. It appears that the patient checked out okay as far as that is concerned with regard to ejection fraction though we still have some issues here to  be honest with his diastolic function. I am unsure if this is accounting for everything that we are seeing or not. Either way he has a tremendous amount of drainage from his legs that we are just not able to control in the outpatient setting at this point. I have reached out to Dr. Rockey Situ his cardiologist to see once he reviews the sheet if there is anything that he feels like can be done from an outpatient perspective if not then I think the way to go is probably can to be through inpatient admission and diuresis. Otherwise I am not sure how working to get this under control we tried antibiotics, compression wrapping, and I have told  the patient to be elevating his legs I am not sure how much he does of this but either way I think that this is still an ongoing issue nonetheless. 04/26/2021 upon evaluation today patient appears to be doing poorly in regard to his legs. He is having a tremendous amount of fluid at this point which is quite unfortunate. Its to the point that he may have had at least 5 to 10 pounds of fluid in his dressings this morning when they were removed these were changed this Friday. Subsequently I think he needs to go to the ER for further evaluation and treatment I think is probably can need diuresis possibly even IV antibiotics been on what the blood work looks like but in general I feel like he needs something to get this under control from an outpatient perspective absent of everywhere I can think of and I cannot get this under control with our traditional measures. I think this is going require more so that we can get him better 12/30; this is a patient with severe bilateral lymphedema. He was hospitalized from 04/26/2021 through 04/29/2021 treated for cellulitis in the setting of lower extremity ulcers and lymphedema. After he left the hospital he is apparently seen for nurse visit our staff contacted cardiology and he has been started on Lasix 40 mg. Apparently his legs have less edema. Lab work from 05/04/2021 showed a BUN of 38 and creatinine of 1.59 these are elevated versus previous where his creatinine seems to have been 1.30 on 12/19 his potassium is 4.3. I believe the lab work is being followed by cardiology We have him in a 4-layer wrap. Xeroform on the leg wounds and sit to fit on the Berry damage skin on the left dorsal foot versus right dorsal foot. He has compression pumps but does not use them. We have apparently not yet ordered him compression stockings 05/17/2021 upon evaluation today patient's legs though better than last time I personally saw him appear to be getting worse compared to  where they were previous. Dr. Quentin Cornwall was actually last 1 to see you I have not seen him since 19 December. That was before he went into the ER. Coming out apparently his legs looked also and they still look better but not as good as they were in the past. 1/16; patient with severe bilateral lymphedema. Severe scaled hyperkeratotic skin on the dorsal aspect of his distal left foot and left medial ankle.. On the right side changes are not as bad. He did not have any weeping edema. Our intake nurse was convinced that he is being compliant with compression pumps 1 hour twice a day 05/31/2021 upon evaluation today patient actually appears to be doing a little bit better in my opinion in regard to his feet. I do not  see as much drainage and it being just completely wet as it was previous. Fortunately I do not also see any signs of active infection which is great news as well. 06/07/21 Upon inspection patient's wound bed actually showed signs of doing well he is not nearly as weepy and wet as he has been in the past and overall very pleased in that regard. Fortunately I do not see any signs of active infection locally or nor systemically at this time. Which is great news. No fevers, chills, nausea, vomiting, or diarrhea. 06/14/2021 upon evaluation today patient appears to be doing well with regard to his right foot I am pleased in that regard. His left foot is still draining quite a bit despite using lymphedema pumps, 4-layer compression wraps, and he tells me elevating his legs as well. He also has Lasix that he takes twice a day. Nonetheless I believe that this is still good to be an ongoing issue. We have a hard time getting this under control as far as the swelling is concerned. Objective [name], [name] ([id number]) Constitutional Obese and well-hydrated in no acute distress. Vitals Time Taken: 4:18 PM, Height: 73 in, Weight: 312 lbs, BMI: 41.2, Temperature: 97.8 F, Pulse: 83 bpm, Respiratory Rate: 18  breaths/min, Blood Pressure: 145/71 mmHg. Respiratory normal breathing without difficulty. Psychiatric this patient is able to make decisions and demonstrates good insight into disease process. Alert and Oriented x 3. pleasant and cooperative. General Notes: Upon inspection patient's wound bed actually showed signs of good granulation and epithelization at this point. Fortunately I do not see any signs of infection at this time which is great news. I still do not believe he needs any antibiotics based on what I am seeing. Nonetheless this is still continue to be a significant problem here as far as the weeping of the left foot is concerned and I am not really sure what to do about getting this under better control. I really exhausted pretty much every means that we have. Integumentary (Hair, Skin) Wound #18 status is Healed - Epithelialized. Original cause of wound was Gradually Appeared. The date acquired was: 06/07/2021. The wound has been in treatment 1 weeks. The wound is located on the Left,Dorsal Foot. The wound measures 0cm length x 0cm width x 0cm depth; 0cm^2 area and 0cm^3 volume. There is a medium amount of serosanguineous drainage noted. Other Condition(s) Patient presents with Lymphedema located on the Left Leg. Patient presents with Lymphedema located on the Right Leg. 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 Post procedure Diagnosis Wound #: Same as Pre-Procedure There was a Four Layer  Compression Therapy Procedure by Levora Dredge, RN. Post procedure Diagnosis Wound #: Same as Pre-Procedure Plan Follow-up Appointments: Return Appointment in 1 week. Nurse Visit as needed Home Health: [facility]: - [place] for wound care. May utilize formulary equivalent dressing for wound treatment orders unless otherwise specified. Home Health Nurse may visit PRN to address patient s wound care needs. - frequency 3 times per week - patient will be seen once at wound center - home  health to see patient 2 times per week Archbald, [name] ([id number]) Scheduled days for dressing changes to be completed; exception, patient has scheduled wound care visit that day. **Please direct any NON-WOUND related issues/requests for orders to patient's Primary Care Physician. **If current dressing causes regression in wound condition, may D/C ordered dressing product/s and apply Normal Saline Moist Dressing daily until next [place] or Other MD appointment. **Notify Wound Healing Center of regression in wound condition at [phone]. Bathing/ Shower/ Hygiene: May shower with wound dressing protected with water repellent cover or cast protector. No tub bath. Edema Control - Lymphedema / Segmental Compressive Device / Other: Optional: One layer of unna paste to top of compression wrap (to act as an anchor). - Unna paste on calf to secure wrap in place as needed 4 Layer Compression System Lymphedema. - bilateral legs bi lat, nystatin powder between toes on bilateral toes, 2x2 gauze between toes only needed on left side, apply zetuvits/ or formulary for extra absorbant dressing around ankles and foot area, use ABD to cover toes; 3 times per week COTTON LAYER SPIRAL, LIGHT TAN SPIRAL. WHITE WITH YELLOW LINE FIGURE 8 , COBAN SPIRAL Elevate, Exercise Daily and Avoid Standing for Long Periods of Time. Elevate legs to the level of the heart and pump ankles as often as  possible Elevate leg(s) parallel to the floor when sitting. Compression Pump: Use compression pump on left lower extremity for 60 minutes, twice daily. - 2 times per day DO YOUR BEST to sleep in the bed at night. DO NOT sleep in your recliner. Long hours of sitting in a recliner leads to swelling of the legs and/or potential wounds on your backside. Other: - Contact prescriber regarding use of diuretics to reduce fluid overload. Off-Loading: Open toe surgical shoe - [facility] appt to fit for footwear 06/08/21 Turn and reposition every 2 hours Additional Orders / Instructions: Follow Nutritious Diet and Increase Protein Intake The following medication(s) was prescribed: nystatin topical 100,000 unit/gram powder powder topical applied 3 times per week with each dressing change to the toes bilateral x 30 days starting 06/14/2021 1. We will go ahead and send in a prescription for nystatin powder for the patient. He has been using this between the toes which I think is okay to still do. 2. I am also can recommend that we use that Zetuvit over the left foot and right foot dorsally in order to catch the excessive drainage. 3. I am also can recommend that we continue with a 4-layer compression wrap patient should also continue with his use of the lymphedema pumps 2 times a day. I still think this is going to be of utmost importance. 4. I would recommend as well the patient continue to elevate his legs as much as possible to help with edema control. We will see patient back for reevaluation in 1 week here in the clinic. If anything worsens or changes patient will contact our office for additional recommendations. Electronic Signature(s) Signed: 06/14/2021 5:12:08 PM By: Worthy Keeler PA-C Entered By: Worthy Keeler on 06/14/2021 17:12:08 [name] ([id number]) -------------------------------------------------------------------------------- SuperBill Details Patient Name: [name] Date of  Service: 06/14/2021 Medical Record Number: [id number] Patient Account Number: [id number] Date of Birth/Sex: 1950-09-30 (71 y.o. M) Treating RN: Levora Dredge Primary Care Provider: Felipa Eth Other Clinician: Referring Provider: Felipa Eth Treating Provider/Extender: Skipper Cliche in Treatment: 19 Diagnosis Coding ICD-10 Codes Code Description E11.622 Type 2 diabetes mellitus with other skin ulcer I89.0 Lymphedema, not  elsewhere classified I87.2 Venous insufficiency (chronic) (peripheral) L97.822 Non-pressure chronic ulcer of other part of left lower leg with fat layer exposed L97.512 Non-pressure chronic ulcer of other part of right foot with fat layer exposed I10 Essential (primary) hypertension L97.811 Non-pressure chronic ulcer of other part of right lower leg limited to breakdown of skin Facility Procedures CPT4: Description Modifier Quantity Code [id number] BILATERAL: Application of multi-layer venous compression system; leg (below knee), including 1 ankle and foot. Physician Procedures CPT4 Code: 8478412 Description: 82081 - WC PHYS LEVEL 4 - EST PT Modifier: Quantity: 1 CPT4 Code: Description: ICD-10 Diagnosis Description E11.622 Type 2 diabetes mellitus with other skin ulcer I89.0 Lymphedema, not elsewhere classified I87.2 Venous insufficiency (chronic) (peripheral) L97.822 Non-pressure chronic ulcer of other part of left lower  leg with fat layer Modifier: exposed Quantity: Electronic Signature(s) Signed: 06/14/2021 5:12:21 PM By: Worthy Keeler PA-C Previous Signature: 06/14/2021 5:12:29 PM Version By: Levora Dredge Entered By: Worthy Keeler on 06/14/2021 17:12:20

## 2021-06-14 NOTE — Progress Notes (Addendum)
SALAM, MICUCCI ([id number]) Visit Report for 06/14/2021 Arrival Information Details Patient Name: [name], [name] Date of Service: 06/14/2021 3:45 PM Medical Record Number: [id number] Patient Account Number: [id number] Date of Birth/Sex: [birth year] (71 y.o. M) Treating RN: Levora Dredge Primary Care Jeffree Cazeau: Felipa Eth Other Clinician: Referring Ariell Gunnels: Felipa Eth Treating Demetris Capell/Extender: Skipper Cliche in Treatment: 19 Visit Information History Since Last Visit Added or deleted any medications: No Patient Arrived: [name] Any new allergies or adverse reactions: No Arrival Time: 16:18 Had a fall or experienced change in No Accompanied By: self activities of daily living that may affect Transfer Assistance: None risk of falls: Patient Identification Verified: Yes Hospitalized since last visit: No Secondary Verification Process Completed: Yes Has Dressing in Place as Prescribed: Yes Patient Requires Transmission-Based No Has Compression in Place as Prescribed: Yes Precautions: Pain Present Now: No Patient Has Alerts: Yes Patient Alerts: [facility] consult on file Last ABI-R 1.09; L 1.04 Electronic Signature(s) Signed: 06/14/2021 5:12:29 PM By: Levora Dredge Entered By: Levora Dredge on 06/14/2021 16:18:28 [name] ([id number]) -------------------------------------------------------------------------------- Clinic Level of Care Assessment Details Patient Name: [name] Date of Service: 06/14/2021 3:45 PM Medical Record Number: [id number] Patient Account Number: [id number] Date of Birth/Sex: Nov 13, 1950 (71 y.o. M) Treating RN: Levora Dredge Primary Care Keshayla Schrum: Felipa Eth Other Clinician: Referring Zanai Mallari: Felipa Eth Treating Kassady Laboy/Extender: Skipper Cliche in Treatment: 19 Clinic Level of Care Assessment Items TOOL 1 Quantity Score []  - Use when EandM and Procedure is performed on INITIAL visit 0 ASSESSMENTS - Nursing Assessment /  Reassessment []  - General Physical Exam (combine w/ comprehensive assessment (listed just below) when performed on new 0 pt. evals) []  - 0 Comprehensive Assessment (HX, ROS, Risk Assessments, Wounds Hx, etc.) ASSESSMENTS - Wound and Skin Assessment / Reassessment []  - Dermatologic / Skin Assessment (not related to wound area) 0 ASSESSMENTS - Ostomy and/or Continence Assessment and Care []  - Incontinence Assessment and Management 0 []  - 0 Ostomy Care Assessment and Management (repouching, etc.) PROCESS - Coordination of Care []  - Simple Patient / Family Education for ongoing care 0 []  - 0 Complex (extensive) Patient / Family Education for ongoing care []  - 0 Staff obtains Programmer, systems, Records, Test Results / Process Orders []  - 0 Staff telephones HHA, Nursing Homes / Clarify orders / etc []  - 0 Routine Transfer to another Facility (non-emergent condition) []  - 0 Routine Hospital Admission (non-emergent condition) []  - 0 New Admissions / Biomedical engineer / Ordering NPWT, Apligraf, etc. []  - 0 Emergency Hospital Admission (emergent condition) PROCESS - Special Needs []  - Pediatric / Minor Patient Management 0 []  - 0 Isolation Patient Management []  - 0 Hearing / Language / Visual special needs []  - 0 Assessment of Community assistance (transportation, D/C planning, etc.) []  - 0 Additional assistance / Altered mentation []  - 0 Support Surface(s) Assessment (bed, cushion, seat, etc.) INTERVENTIONS - Miscellaneous []  - External ear exam 0 []  - 0 Patient Transfer (multiple staff / Civil Service fast streamer / Similar devices) []  - 0 Simple Staple / Suture removal (25 or less) []  - 0 Complex Staple / Suture removal (26 or more) []  - 0 Hypo/Hyperglycemic Management (do not check if billed separately) []  - 0 Ankle / Brachial Index (ABI) - do not check if billed separately Has the patient been seen at the hospital within the last three years: Yes Total Score: 0 Level Of Care:  ____ [name] ([id number]) Electronic Signature(s) Signed: 06/14/2021 5:12:29 PM By: Levora Dredge Entered By: Levora Dredge on 06/14/2021 16:59:53 [name],  [name] ([id number]) -------------------------------------------------------------------------------- Compression Therapy Details Patient Name: [name], [name] Date of Service: 06/14/2021 3:45 PM Medical Record Number: [id number] Patient Account Number: [id number] Date of Birth/Sex: [birth year] (71 y.o. M) Treating RN: Levora Dredge Primary Care Safal Halderman: Felipa Eth Other Clinician: Referring Blossie Raffel: Felipa Eth Treating Chrislyn Seedorf/Extender: Jeri Cos Weeks in Treatment: 19 Compression Therapy Performed for Wound Assessment: NonWound Condition Lymphedema - Left Leg Performed By: Clinician Levora Dredge, RN Compression Type: Four Layer Post Procedure Diagnosis Same as Pre-procedure Electronic Signature(s) Signed: 06/14/2021 5:12:29 PM By: Levora Dredge Entered By: Levora Dredge on 06/14/2021 16:59:22 [name] ([id number]) -------------------------------------------------------------------------------- Compression Therapy Details Patient Name: [name] Date of Service: 06/14/2021 3:45 PM Medical Record Number: [id number] Patient Account Number: [id number] Date of Birth/Sex: 08/03/1950 (71 y.o. M) Treating RN: Levora Dredge Primary Care Arnett Galindez: Felipa Eth Other Clinician: Referring Kinsley Holderman: Felipa Eth Treating Selene Peltzer/Extender: Skipper Cliche in Treatment: 19 Compression Therapy Performed for Wound Assessment: NonWound Condition Lymphedema - Right Leg Performed By: Clinician Levora Dredge, RN Compression Type: Four Layer Post Procedure Diagnosis Same as Pre-procedure Electronic Signature(s) Signed: 06/14/2021 5:12:29 PM By: Levora Dredge Entered By: Levora Dredge on 06/14/2021 16:59:36 [name]  ([id number]) -------------------------------------------------------------------------------- Encounter Discharge Information Details Patient Name: [name] Date of Service: 06/14/2021 3:45 PM Medical Record Number: [id number] Patient Account Number: [id number] Date of Birth/Sex: 1950-06-29 (71 y.o. M) Treating RN: Levora Dredge Primary Care Syra Sirmons: Felipa Eth Other Clinician: Referring Mckinleigh Schuchart: Felipa Eth Treating Linder Prajapati/Extender: Skipper Cliche in Treatment: 19 Encounter Discharge Information Items Discharge Condition: Stable Ambulatory Status: Cane Discharge Destination: Home Transportation: Private Auto Accompanied By: self Schedule Follow-up Appointment: Yes Clinical Summary of Care: Electronic Signature(s) Signed: 06/14/2021 5:12:29 PM By: Levora Dredge Entered By: Levora Dredge on 06/14/2021 17:01:24 [name] ([id number]) -------------------------------------------------------------------------------- Lower Extremity Assessment Details Patient Name: [name] Date of Service: 06/14/2021 3:45 PM Medical Record Number: [id number] Patient Account Number: [id number] Date of Birth/Sex: Sep 20, 1950 (71 y.o. M) Treating RN: Levora Dredge Primary Care Hiilei Gerst: Felipa Eth Other Clinician: Referring Praise Dolecki: Felipa Eth Treating Eisa Necaise/Extender: Jeri Cos Weeks in Treatment: 19 Edema Assessment Assessed: [Left: No] Patrice Paradise: No] [Left: Edema] [Right: :] Calf Left: Right: Point of Measurement: 35 cm From Medial Instep 40 cm 39 cm Ankle Left: Right: Point of Measurement: 10 cm From Medial Instep 37 cm 31 cm Electronic Signature(s) Signed: 06/14/2021 5:12:29 PM By: Levora Dredge Entered By: Levora Dredge on 06/14/2021 16:34:16 [name] ([id number]) -------------------------------------------------------------------------------- Multi Wound Chart Details Patient Name: [name] Date of Service: 06/14/2021 3:45 PM Medical  Record Number: [id number] Patient Account Number: [id number] Date of Birth/Sex: [birth year] (71 y.o. M) Treating RN: Levora Dredge Primary Care Dimitra Woodstock: Felipa Eth Other Clinician: Referring Alnita Aybar: Felipa Eth Treating Barnie Sopko/Extender: Skipper Cliche in Treatment: 19 Vital Signs Height(in): 73 Pulse(bpm): 83 Weight(lbs): 312 Blood Pressure(mmHg): 145/71 Body Mass Index(BMI): 41.2 Temperature(F): 97.8 Respiratory Rate(breaths/min): 18 Wound Assessments Treatment Notes Electronic Signature(s) Signed: 06/14/2021 5:12:29 PM By: Levora Dredge Entered By: Levora Dredge on 06/14/2021 16:41:25 [name] ([id number]) -------------------------------------------------------------------------------- Multi-Disciplinary Care Plan Details Patient Name: [name] Date of Service: 06/14/2021 3:45 PM Medical Record Number: [id number] Patient Account Number: [id number] Date of Birth/Sex: 05/16/1950 (71 y.o. M) Treating RN: Levora Dredge Primary Care Kamaile Zachow: Felipa Eth Other Clinician: Referring Zuleica Seith: Felipa Eth Treating Kent Braunschweig/Extender: Jeri Cos Weeks in Treatment: 19 Active Inactive Electronic Signature(s) Signed: 06/14/2021 5:12:29 PM By: Levora Dredge Entered By: Levora Dredge on 06/14/2021 16:41:15 [name] ([id number]) -------------------------------------------------------------------------------- Non-Wound Condition Assessment Details Patient Name: [name] Date of Service: 06/14/2021 3:45 PM Medical  Record Number: [id number] Patient Account Number: [id number] Date of Birth/Gender: 06-15-1950 (71 y.o. M) Treating RN: Levora Dredge Primary Care Physician: Felipa Eth Other Clinician: Referring Physician: Felipa Eth Treating Physician/Extender: Jeri Cos Weeks in Treatment: 19 Non-Wound Condition: Condition: Lymphedema Location: Leg Side: Left Notes: Anterior aspect of lower leg Photos Electronic  Signature(s) Signed: 06/14/2021 5:12:29 PM By: Levora Dredge Entered By: Levora Dredge on 06/14/2021 16:33:10 [name] ([id number]) -------------------------------------------------------------------------------- Non-Wound Condition Assessment Details Patient Name: [name] Date of Service: 06/14/2021 3:45 PM Medical Record Number: [id number] Patient Account Number: [id number] Date of Birth/Gender: [birth year] (71 y.o. M) Treating RN: Levora Dredge Primary Care Physician: Felipa Eth Other Clinician: Referring Physician: Felipa Eth Treating Physician/Extender: Jeri Cos Weeks in Treatment: 19 Non-Wound Condition: Condition: Lymphedema Location: Leg Side: Right Photos Electronic Signature(s) Signed: 06/14/2021 5:12:29 PM By: Levora Dredge Entered By: Levora Dredge on 06/14/2021 16:33:11 [name] ([id number]) -------------------------------------------------------------------------------- Pain Assessment Details Patient Name: [name] Date of Service: 06/14/2021 3:45 PM Medical Record Number: [id number] Patient Account Number: [id number] Date of Birth/Sex: 06-05-1950 (71 y.o. M) Treating RN: Levora Dredge Primary Care Marchele Decock: Felipa Eth Other Clinician: Referring Arnice Vanepps: Felipa Eth Treating Charina Fons/Extender: Skipper Cliche in Treatment: 19 Active Problems Location of Pain Severity and Description of Pain Patient Has Paino No Site Locations Rate the pain. Current Pain Level: 0 Pain Management and Medication Current Pain Management: Electronic Signature(s) Signed: 06/14/2021 5:12:29 PM By: Levora Dredge Entered By: Levora Dredge on 06/14/2021 16:20:03 [name] ([id number]) -------------------------------------------------------------------------------- Patient/Caregiver Education Details Patient Name: [name] Date of Service: 06/14/2021 3:45 PM Medical Record Number: [id number] Patient Account Number:  [id number] Date of Birth/Gender: 1950-11-09 (71 y.o. M) Treating RN: Levora Dredge Primary Care Physician: Felipa Eth Other Clinician: Referring Physician: Felipa Eth Treating Physician/Extender: Skipper Cliche in Treatment: 19 Education Assessment Education Provided To: Patient Education Topics Provided Wound/Skin Impairment: Handouts: Caring for Your Ulcer Methods: Explain/Verbal Responses: State content correctly Electronic Signature(s) Signed: 06/14/2021 5:12:29 PM By: Levora Dredge Entered By: Levora Dredge on 06/14/2021 17:00:15 [name] ([id number]) -------------------------------------------------------------------------------- Wound Assessment Details Patient Name: [name] Date of Service: 06/14/2021 3:45 PM Medical Record Number: [id number] Patient Account Number: [id number] Date of Birth/Sex: March 14, 1951 (71 y.o. M) Treating RN: Levora Dredge Primary Care Alexsys Eskin: Felipa Eth Other Clinician: Referring Antuan Limes: Felipa Eth Treating Lunah Losasso/Extender: Jeri Cos Weeks in Treatment: 19 Wound Status Wound Number: 18 Primary Etiology: Lymphedema Wound Location: Left, Dorsal Foot Secondary Etiology: Diabetic Wound/Ulcer of the Lower Extremity Wounding Event: Gradually Appeared Wound Status: Healed - Epithelialized Date Acquired: 06/07/2021 Weeks Of Treatment: 1 Clustered Wound: No Wound Measurements Length: (cm) 0 Width: (cm) 0 Depth: (cm) 0 Area: (cm) Volume: (cm) % Reduction in Area: 100% % Reduction in Volume: 100% 0 0 Wound Description Classification: Full Thickness Without Exposed Support Structu Exudate Amount: Medium Exudate Type: Serosanguineous Exudate Color: red, brown res Treatment Notes Wound #18 (Foot) Wound Laterality: Dorsal, Left Cleanser Peri-Wound Care Topical Primary Dressing Secondary Dressing Secured With Compression Wrap Compression Stockings Add-Ons Electronic Signature(s) Signed:  06/14/2021 5:12:29 PM By: Levora Dredge Entered By: Levora Dredge on 06/14/2021 16:42:31 [name] ([id number]) -------------------------------------------------------------------------------- Vitals Details Patient Name: [name] Date of Service: 06/14/2021 3:45 PM Medical Record Number: [id number] Patient Account Number: [id number] Date of Birth/Sex: 1951/02/13 (71 y.o. M) Treating RN: Levora Dredge Primary Care Lendell Gallick: Felipa Eth Other Clinician: Referring Dorette Hartel: Felipa Eth Treating Mikaila Grunert/Extender: Skipper Cliche in Treatment: 19 Vital Signs Time Taken: 16:18 Temperature (F): 97.8 Height (in): 73 Pulse (bpm): 83 Weight (lbs): 312 Respiratory  Rate (breaths/min): 18 Body Mass Index (BMI): 41.2 Blood Pressure (mmHg): 145/71 Reference Range: 80 - 120 mg / dl Electronic Signature(s) Signed: 06/14/2021 5:12:29 PM By: Levora Dredge Entered By: Levora Dredge on 06/14/2021 16:19:42

## 2021-06-21 ENCOUNTER — Other Ambulatory Visit: Payer: Self-pay

## 2021-06-21 ENCOUNTER — Encounter: Payer: PPO | Admitting: Physician Assistant

## 2021-06-21 DIAGNOSIS — E11622 Type 2 diabetes mellitus with other skin ulcer: Secondary | ICD-10-CM | POA: Diagnosis not present

## 2021-06-21 NOTE — Progress Notes (Addendum)
[name], [name] ([id number]) Visit Report for 06/21/2021 Chief Complaint Document Details Patient Name: [name] Date of Service: 06/21/2021 1:45 PM Medical Record Number: [id number] Patient Account Number: [id number] Date of Birth/Sex: December 29, 1950 (71 y.o. M) Treating RN: Donnamarie Poag Primary Care Provider: Felipa Eth Other Clinician: Referring Provider: Felipa Eth Treating Provider/Extender: Skipper Cliche in Treatment: 20 Information Obtained from: Patient Chief Complaint Left LE ulcers Electronic Signature(s) Signed: 06/21/2021 2:03:28 PM By: Worthy Keeler PA-C Entered By: Worthy Keeler on 06/21/2021 14:03:28 [name] ([id number]) -------------------------------------------------------------------------------- HPI Details Patient Name: [name] Date of Service: 06/21/2021 1:45 PM Medical Record Number: [id number] Patient Account Number: [id number] Date of Birth/Sex: Nov 22, 1950 (71 y.o. M) Treating RN: Donnamarie Poag Primary Care Provider: Felipa Eth Other Clinician: Referring Provider: Felipa Eth Treating Provider/Extender: Skipper Cliche in Treatment: 20 History of Present Illness HPI Description: 71 year old male who presented to the ER with bilateral lower extremity blisters which had started last week. he has a past medical history of leukemia, diabetes mellitus, hypertension, edema of both lower extremities, his recurrent skin infections, peripheral vascular disease, coronary artery disease, congestive heart failure and peripheral neuropathy. in the ER he was given Rocephin and put on Silvadene cream. he was put on oral doxycycline and was asked to follow-up with the [facility]. His last hemoglobin A1c was 6.6 in December and he checks his blood sugar once a week. He does not have any physicians outside the [place] system. He does not recall any vascular duplex studies done either for arterial or venous disease but was told to wear compression  stockings which he does not use 05/30/2016 -- we have not yet received any of his notes from the [facility] hospital system and his arterial and venous duplex studies are scheduled here in [place] around mid February. We are unable to have his insurance accepted by home health agencies and hence he is getting dressings only once a week. 06/06/16 -- -- I received a call from the patient's PCP at the [facility] and spoke to Dr. Garvin Fila, phone number [phone] and fax number [phone]. She confirmed that no vascular testing was done over the last 5 years and she would be happy to do them if the patient did want them to be done at the [place] and we could fax him a request. Readmission: 71 year old male seen by as in February of this year and was referred to vein and vascular for studies and opinion from the vascular surgeons. The patient returns today with a fresh problem having had blisters on his left lower extremity which have been there for about 5 days and he clearly states that he has been wearing his compression stockings as advised though he could not read the moderate compression and has been wearing light compression. Review of his electronic medical records note that he had lower extremity arterial duplex examination done on 06/23/2016 which showed no hemodynamically significant stenosis in the bilateral lower extremity arterial system. He also had a lower extremity venous reflux examination done on 07/07/2016 and it was noted that he had venous incompetence in the right great saphenous vein and bilateral common femoral veins. Patient was seen by Dr. Tamala Julian on the same day and for some reason his notes do not reflect the venous studies or the arterial studies and he recommended patient do a venous duplex ultrasound to look for reflux and return to see him.he would also consider a lymph pump if required. The patient was told that his workup  was normal and hence the patient canceled his  follow-up appointment. 02/03/17 on evaluation today patient left medial lower extremity blister appears to be doing about the same. It is still continuing to drain and there's still the blistered skin covering the wound bed which is making it difficult for the alternate to do its job. Fortunately there is no evidence of cellulitis. No fevers chills noted. Patient states in general he is not having any significant discomfort. Patient's lower extremity arterial duplex exam revealed that patient was hemodynamically stable with no evidence of stenosis in regard to the bilateral lower extremities. The lower extremity venous reflux exam revealed the patient had venous incontinence noted in the right greater saphenous and bilateral common femoral vein. There is no evidence of deep or superficial vein thrombosis in the bilateral lower extremities. Readmission: 11/12/18 Patient presents for evaluation our clinic today concerning issues that he is having with his left lower extremity. He tells me that a couple weeks ago he began developing blisters on the left lower extremity along with increased swelling. He typically wears his compression stockings on a regular basis is previously been evaluated both here as well is with vascular surgery they would recommend lymphedema pumps but unfortunately that somehow fell through and he never heard anything back from that. Nonetheless I think lymphedema pumps would be beneficial for this patient. He does have a history of hypertension and diabetes. Obviously the chronic venous stasis and lymphedema as well. At this point the blisters have been given in more trouble he states sometimes when the blisters openings able to clean it down with alcohol and it will dry out and do well. Unfortunately that has not been the case this time. He is having some discomfort although this mean these with cleaning the areas he doesn't have discomfort just on a regular basis. He has not been  able to wear his compression stockings since the blisters arose due to the fact that of course it will drain into the socks causing additional issues and he didn't have any way to wrap this otherwise. He has increased to taking his Lasix every day instead of every other day. He sees his primary care provider later this month as well. No fevers, chills, nausea, or vomiting noted at this time. 11/19/18-Patient returns at 1 week, per intake RN the amount of seepage into the compression wraps was definitely improved, overall all the wounds are measuring smaller but continuing silver alginate to the wounds as primary dressing 11/26/18 on evaluation today patient appears to be doing quite well in regard to his left lower [place] ulcers. In fact of the areas that were noted initially he only has two regions still open. There is no evidence of active infection at this time. He still is not heard anything from the company regarding lymphedema pumps as of yet. Again as previously seen vascular they have not recommended any surgical intervention. 12/03/2018 on evaluation today patient actually appears to be doing quite well with regard to his lower extremity ulcers. In fact most of the areas appear to be healed the one spot which does not seem to be completely healed I am unsure of whether or not this is really draining that much but nonetheless there does not appear to be any signs of infection or significant drainage at this point. There is no sign of fever, chills, nausea, vomiting, or diarrhea. Overall I am pleased with how things have progressed I think is very close to being able to transition  to his home compression stockings. [name], [name] ([id number]) 12/10/2018 upon evaluation today patient appears to be doing quite well with regard to his left lower extremity. He has been tolerating the dressing changes without complication. Fortunately there is no signs of active infection at this time. He appears after  thorough evaluation of his leg to only have 1 small area that remains open at this point everything else appears to be almost completely closed. He still have significant swelling of the left lower extremity. We had discussed discussing this with his primary care provider he is not able to see her in person they were at the [facility] and right now the [place] is not seeing patients on site. According to the patient anyway. Subsequently he did speak with her apparently and his primary care provider feels that he may likely have a DVT. With that being said she has not seen his leg she is just going off of his history. Nonetheless that is a concern that the patient now has as well and while I do not feel the DVT is likely we can definitely ensure that that is not the case I will go ahead and see about putting that order in today. Nonetheless otherwise I am in a recommend that we continue with the current wound care measures including the compression therapy most likely. We just need to ensure that his leg is indeed free of any DVTs. 12/17/2018 on evaluation today patient actually appears to be completely healed today. He does have 2 very small areas of blistering although this is not anything too significant at this point which is good news. With that being said I am in agreement with the fact that I think he is completely healed at this point. He does want to get back into his compression stocking. The good news is we have gotten approval from insurance for his lymphedema pumps we received a letter since last saw him last week. The other good news is his study did come back and showed no evidence of a DVT. 12/20/2018 on evaluation today patient presents for follow-up concerning his ongoing issues with his left lower extremity. He was actually discharged last Friday and did fairly well until he states blisters opened this morning. He tells me he has been wearing his compression stocking although he has a hard  time getting this on. There does not appear to be any signs of active infection at this time. No fevers, chills, nausea, vomiting, or diarrhea. 12/27/2018 on evaluation today patient appears to be doing very well with regard to his swelling of the left lower extremity the 4 layer compression wrap seems to have been beneficial for him. Fortunately there is no signs of active infection at this time. Patient has been tolerating the compression wrap without complication and his foot swelling in particular appears to be greatly improved. He does still have a wound on the lateral portion of his left leg I believe this is more of a blister that has now reopened. 01/03/2019 on evaluation today patient actually appears to be doing excellent in regard to his left lower extremity. He did receive his compression pumps and is actually use this 7 times since he was last here in the office. On top of the compression wrap he is now roughly 3 cm better at the calf and 2 cm better at the ankle he also states that his foot seem to go an issue better without even having to use a shoe horn. Obviously I  think this is all evidence that he is doing excellent in this regard. The other good news is he does not appear to have anything open today as far as wounds are concerned. 01/15/2019 on evaluation today patient appears to be doing more poorly yet again with regard to his left lower extremity. He has developed new wounds again after being discharged just recently. Unfortunately this continues to be the case that he will heal and then have subsequent new wounds. The last time I was hopeful that he may not end up coming back too quickly especially since he states he has been using his lymphedema pumps along with wearing his compression. Nonetheless he had a blister on the back of his leg that popped up on the left and this has opened up into an ulceration it is quite painful. 01/22/19 on evaluation today patient actually appears  to be doing well with regard to his wound on the left lower extremity. He's been tolerating the dressing changes without complication including the compression wrap in the wound appears to be significantly smaller today which is great news. Overall very pleased in this regard. 01/29/2019 on evaluation today patient appears to be doing well with regard to his left posterior lower extremity ulcer. He has been tolerating the dressing changes without complication. This is not completely healed but is getting much closer. We did order a Farrow wrap 4000 for him he has received this and has it with him today although I am not sure we are quite ready to start him on that as of yet. We are very close. 02/05/2019 on evaluation today patient actually appears to be doing quite well with regard to his left posterior lower extremity ulcer. He still has a very tiny opening remaining but the fortunate thing is he seems to be healing quite nicely. He also did get his Farrow wrap which I am hoping will help with his edema control as well at home. Fortunately there is no evidence of active infection. 02/12/2019 patient and fortunately appears to be doing poorly in regard to his wounds of the left lower extremity. He was very close to healing therefore we attempted to use his Velcro compression wraps continuing with lymphedema pumps at home. Unfortunately that does not seem to have done very well for him. He tells me that he wore them all the time but again I am not sure why if that is the case that he is having such significant edema. He is still on his fluid pills as well. With that being said there is no obvious sign of infection although I do wonder about the possibility of infection at this time as well. 02/19/2019 unfortunately upon evaluation today patient appears to be doing more poorly with regard to his left lower extremity. He is not showing signs of significant improvement and I think the biggest issue here is  that he does have an infection that appears to likely be Pseudomonas. That is based on the blue-green drainage that were noted at this time. Unfortunately the antibiotic that has been on is not going to take care of this at all. I think they will get a need to switch him to either Levaquin or Cipro and this was discussed with the patient. 02/26/2019 on evaluation today patient's lower extremity on the left appears to be doing significantly better as compared to last evaluation. Fortunately there is no signs of active infection at this time. He has been tolerating the compression wrap without complication in  fact he made it the whole week at this point. He is showing signs of excellent improvement I am very happy in this regard. With that being said he is having some issues with infection we did review the results of his culture which I noted today. He did have a positive finding for Enterobacter as well as Alcaligenes faecalis. Fortunately the Levaquin that I placed him on will work for both which is great news. There is no signs of systemic infection at this point. 10/30; left posterior leg wound in the setting of very significant edema and what looks like chronic venous inflammation. He has compression pumps but does not use them. We have been using 3 layer compression. Silver alginate to the wound as the primary dressing 03/18/2019 on evaluation today patient appears to be doing a little better compared to last time I saw him. He really has not been using his compression pumps he tells me that he is having too much discomfort. He has been keeping his wraps on however. He is only been taking his fluid pills every other day because he states they are not really helping and he has an appointment with his primary care provider at the [facility] tomorrow. Subsequently the wound itself on the left lower extremity does seem to be greatly improved compared to previous. 03/25/2019 on evaluation today patient appears  to be doing better with regard to his wounds on the bilateral lower extremities. The left is doing excellent the right is also doing better although both still do show some signs of open wounds noted at this point unfortunately. Fortunately there is no signs of active infection at this time. The patient also is not really having any significant pain which is good news. Unfortunately there was some confusion with the referral on vascular disease and as far as getting the patient scheduled there can be contacting him later today to do this [name], [name] ([id number]) fortunately we got this straightened out. 04/01/2019 on evaluation today patient appears to be doing no fevers, chills, nausea, vomiting, or diarrhea. Excellent at this time with regard to his lower extremities. There does not appear to be any open wound at this point which is good news. Fortunately is also no signs of active infection at this time. Overall feel like the patient has done excellent with the compression the problem is every time we got him to this point and then subsequently go to using his own compression things just go right back to where they were. I am not sure how to address this we can try to get an appointment with vascular for 2 weeks now they have yet to call him. Obviously this has become frustrating for the patient as well. I think the issue has just been an honest error as far as scheduling is concerned but nonetheless still worn out the point where I am unsure of which direction we should take. 04/08/2019 on evaluation today patient actually appears to be doing well with regard to his lower extremities. There are no open wounds at this time and things seem to be managing quite nicely as far as the overall edema control is concerned. With that being said he does have his compression socks today for [place] to go ahead and reinitiate therapy in that manner at this point. He is going to be going for shoes to be  measured on Wednesday and then coincidentally he will also be seeing vascular on Thursday. Overall I think this is good news and  again I am hopeful that they will be able to do something for him to help prevent ongoing issues with edema control as well. No fevers, chills, nausea, vomiting, or diarrhea. 04/11/2019 on evaluation today patient actually appears to be doing poorly after just being discharged on Monday of this week. He had been experiencing issues with again blisters especially on the left lower extremity. With that being said he was completely healed and appeared to be doing great this past Monday. He then subsequently has new blisters that formed before his appointment with vascular this morning. He was also measured for shoes in the interim. With that being said we may have figured out what exactly is going on and why he continues to have issues like what we are seeing at this point. He takes his compression stockings off at nighttime and then he ends up having to sleep in his chair for 5-6 hours a night. He sleeps with his feet down he cannot really get him up in the recliner and therefore he is sleeping and the worst possible his position with his feet on the floor for that majority of the time. Again as I explained to him that is about one third at minimum at least one fourth of his day that he spending with his feet dangling down on the ground and the worst possible position they could be. I think this may be what is causing the issue. Subsequently I am leaning toward thinking that he may need a hospital bed in order to elevate his legs. We likely can have to coordinate this with his primary care provider at the [facility]. Readmission: 01/26/2021 this is a patient who presents for repeat evaluation here in the clinic although it is actually been couple of years since have seen him in fact it was December 2020 when I last saw him. Subsequently he never really healed but did end up  being lost to follow-up. He tells me has been having issues ongoing with his lower extremities has bilateral lower extremity lymphedema no real significant or definitive open wounds but in general his lymphedema is way out of control. We were never able to refer him to lymphedema clinic simply due to the fact to be honest we were never able to get him completely healed. I do not see anyone with open wounds. The patient does have evidence of type 2 diabetes mellitus, lymphedema, chronic venous insufficiency, and hypertension. That really has not changed since his last evaluation. 02/09/2021 upon evaluation today patient appears to be doing a little better in regard to his legs although he still having a tremendous amount of drainage especially on the left leg. Fortunately there does not appear to be any evidence of active infection. Of note when we looked into this further it appears that the patient did not have any absorptive dressing on it was just the 4-layer compression wrap. Nonetheless this is probably big part of the issue here. 10/10; he comes in today with 3 large areas on the upper right lower leg likely remanence of denuded blistering under his compression wraps. He has no other wounds on the right. On the left he has the denuded area on the left medial foot and ankle and on the left dorsal foot. Massive lymphedema in both feet dorsally. Using Zetuvit under compression We have increased home health visitation to twice a week to change the dressings and will change it once 02/22/2021 upon evaluation today patient appears to be doing well currently  with regard to his wounds. He has been tolerating the dressing changes without complication. Fortunately there does not appear to be any evidence of active infection which is great news. No fevers, chills, nausea, vomiting, or diarrhea. The biggest issue I see currently is that home health is not putting any medicine on the actual wounds  before wrapping. 03/01/2021 upon evaluation today the patient's right leg actually appears to be doing quite well which is great news there does not appear to be any evidence of active infection at this time. No fevers, chills, nausea, vomiting, or diarrhea. With that being said the patient is having issues on the left foot where he is having significant drainage is also an ammonia smell he does not have any animals at home and this makes me concerned about a bacteria producing urea as a byproduct. Again the possible common organisms will be E. coli, Proteus, and Enterococcus. All 3 of which can be successfully treated with Levaquin. For that reason I think that this may be a good option for [place] to consider placing him on and I did obtain a culture as well for confirmation sake. 03/08/2021 upon evaluation today patient appears to be doing unfortunately still somewhat poorly in regard to his leg ulcerations. He actually has an area on the right leg where he blistered due to the fact that his wrap slid down and caused an area of pinching on his skin and this has led to a significant issue here. 03/15/2021 upon evaluation today patient unfortunately has not been wrapped appropriately with absorptive dressings nor with the appropriate technique for the third layer of the 4-layer compression wrap. These are issues that we continue to try to address with the home health nurse. Also the absorptive dressing that she had was cut in half and therefore that causes things to leak out it does not actually trap the fluid in regard to the top of the foot overall I think that all these combined are really not seeing things improved significantly here. Fortunately there does not appear to be any signs of significant infection at this time which is good news. He still is having a tremendous amount of drainage. 03/22/2021 upon evaluation today patient appears to be draining tremendously. He still continues to tell me  that he is using his pumps 2 times a day and that coupled with that tells me that he is elevating his legs as well. With that being said all things considered I am really just not seeing the improvement we would expect to see with the 4-layer compression wrap and all the above noted. He in fact had an extremely large Zetuvit dressing on both legs and that they were extremely filled to the max with fluid. This is after just being changed just before the weekend and this is Monday. Nonetheless I am concerned about the fact that there is something going on fairly significant that we cannot get any of this under control and that he is draining this significantly. He supposed be having an echocardiogram it sounds like scheduling has been an issue for him as far as getting in sooner. Its something to do with needing his cousin to drive him because of where it sat and he cannot drive himself to this appointment either way I really think he needs to try to see what he can do about making this happen a little sooner. He tells me he will call today. [name], [name] ([id number]) 03/29/2021 on evaluation today patient appears to be  doing about the same in regard to his legs. He did get his cardiology appointment moved up to 6 December which is at least good that is better than what it was before mid December. Overall very pleased in that regard. 04/05/2021 upon evaluation today patient unfortunately is still doing fairly poorly. There does not appear to be any signs of active infection at this time. No fevers, chills, nausea, vomiting, or diarrhea. Unfortunately I think until his edema is under control and overall fluid overload there is really not to be much chance that I can do much to get him better. This is quite unfortunate and frustrating both for myself and the patient to be perfectly honest. Nonetheless I think that he really needs to have a conversation both with his primary care provider as well as  cardiologist he sees the PCP on Monday and cardiology on Wednesday of next week. 04/13/2021 upon evaluation today patient appears to be doing poorly in regard to his bilateral lower extremities his left is still worse than the right. With that being said he has a tremendous amount of drainage he did see his primary care provider yesterday there really was not much there to be done from their perspective. He sees cardiology tomorrow. Nonetheless my biggest concern here is simply that if we do not get the edema under control he is going to continue to have drainage and honestly I think at some point he is going to become infected severely that is my main concern. 04/19/2021 upon evaluation today patient appears to be doing poorly still in regard to his legs. Unfortunately there does not appear to be any signs of infection at this point. He does have a tremendous amount of drainage however. We have not seen the results back from the cardiologist and the echocardiogram that was done. It appears that the patient checked out okay as far as that is concerned with regard to ejection fraction though we still have some issues here to be honest with his diastolic function. I am unsure if this is accounting for everything that we are seeing or not. Either way he has a tremendous amount of drainage from his legs that we are just not able to control in the outpatient setting at this point. I have reached out to Dr. Rockey Situ his cardiologist to see once he reviews the sheet if there is anything that he feels like can be done from an outpatient perspective if not then I think the way to go is probably can to be through inpatient admission and diuresis. Otherwise I am not sure how working to get this under control we tried antibiotics, compression wrapping, and I have told the patient to be elevating his legs I am not sure how much he does of this but either way I think that this is still an ongoing issue  nonetheless. 04/26/2021 upon evaluation today patient appears to be doing poorly in regard to his legs. He is having a tremendous amount of fluid at this point which is quite unfortunate. Its to the point that he may have had at least 5 to 10 pounds of fluid in his dressings this morning when they were removed these were changed this Friday. Subsequently I think he needs to go to the ER for further evaluation and treatment I think is probably can need diuresis possibly even IV antibiotics been on what the blood work looks like but in general I feel like he needs something to get this under control from  an outpatient perspective absent of everywhere I can think of and I cannot get this under control with our traditional measures. I think this is going require more so that we can get him better 12/30; this is a patient with severe bilateral lymphedema. He was hospitalized from 04/26/2021 through 04/29/2021 treated for cellulitis in the setting of lower extremity ulcers and lymphedema. After he left the hospital he is apparently seen for nurse visit our staff contacted cardiology and he has been started on Lasix 40 mg. Apparently his legs have less edema. Lab work from 05/04/2021 showed a BUN of 38 and creatinine of 1.59 these are elevated versus previous where his creatinine seems to have been 1.30 on 12/19 his potassium is 4.3. I believe the lab work is being followed by cardiology We have him in a 4-layer wrap. Xeroform on the leg wounds and sit to fit on the Berry damage skin on the left dorsal foot versus right dorsal foot. He has compression pumps but does not use them. We have apparently not yet ordered him compression stockings 05/17/2021 upon evaluation today patient's legs though better than last time I personally saw him appear to be getting worse compared to where they were previous. Dr. Quentin Cornwall was actually last 1 to see you I have not seen him since 19 December. That was before he went into  the ER. Coming out apparently his legs looked also and they still look better but not as good as they were in the past. 1/16; patient with severe bilateral lymphedema. Severe scaled hyperkeratotic skin on the dorsal aspect of his distal left foot and left medial ankle.. On the right side changes are not as bad. He did not have any weeping edema. Our intake nurse was convinced that he is being compliant with compression pumps 1 hour twice a day 05/31/2021 upon evaluation today patient actually appears to be doing a little bit better in my opinion in regard to his feet. I do not see as much drainage and it being just completely wet as it was previous. Fortunately I do not also see any signs of active infection which is great news as well. 06/07/21 Upon inspection patient's wound bed actually showed signs of doing well he is not nearly as weepy and wet as he has been in the past and overall very pleased in that regard. Fortunately I do not see any signs of active infection locally or nor systemically at this time. Which is great news. No fevers, chills, nausea, vomiting, or diarrhea. 06/14/2021 upon evaluation today patient appears to be doing well with regard to his right foot I am pleased in that regard. His left foot is still draining quite a bit despite using lymphedema pumps, 4-layer compression wraps, and he tells me elevating his legs as well. He also has Lasix that he takes twice a day. Nonetheless I believe that this is still good to be an ongoing issue. We have a hard time getting this under control as far as the swelling is concerned. 06/21/2021 upon evaluation today patient appears to be doing decently well in regard to his wounds all things considered. He still has a tremendous amount of drainage and fluid noted at this point. Fortunately I do not see any signs of active infection locally or systemically at this point which is great news. Nonetheless I am unsure where to go and how to do this  as far as trying to limit his swelling and weeping from his toes in particular.  Electronic Signature(s) Signed: 06/21/2021 5:07:01 PM By: Worthy Keeler PA-C Entered By: Worthy Keeler on 06/21/2021 17:07:01 [name] ([id number]) -------------------------------------------------------------------------------- Physical Exam Details Patient Name: [name] Date of Service: 06/21/2021 1:45 PM Medical Record Number: [id number] Patient Account Number: [id number] Date of Birth/Sex: May 28, 1950 (71 y.o. M) Treating RN: Donnamarie Poag Primary Care Provider: Felipa Eth Other Clinician: Referring Provider: Felipa Eth Treating Provider/Extender: Skipper Cliche in Treatment: 101 Constitutional Well-nourished and well-hydrated in no acute distress. Respiratory normal breathing without difficulty. Psychiatric this patient is able to make decisions and demonstrates good insight into disease process. Alert and Oriented x 3. pleasant and cooperative. Notes Upon inspection patient's wound bed actually showed signs of doing quite well. He has a lot of weeping around the toes but his legs in general appear to be doing excellent otherwise. I do think that he would benefit from a better pair of shoes I did give him some information in that regard today. Electronic Signature(s) Signed: 06/21/2021 5:07:19 PM By: Worthy Keeler PA-C Entered By: Worthy Keeler on 06/21/2021 17:07:19 [name] ([id number]) -------------------------------------------------------------------------------- Physician Orders Details Patient Name: [name] Date of Service: 06/21/2021 1:45 PM Medical Record Number: [id number] Patient Account Number: [id number] Date of Birth/Sex: 1951/03/08 (71 y.o. M) Treating RN: Donnamarie Poag Primary Care Provider: Felipa Eth Other Clinician: Referring Provider: Felipa Eth Treating Provider/Extender: Skipper Cliche in Treatment: 20 Verbal / Phone Orders:  No Diagnosis Coding ICD-10 Coding Code Description E11.622 Type 2 diabetes mellitus with other skin ulcer I89.0 Lymphedema, not elsewhere classified I87.2 Venous insufficiency (chronic) (peripheral) L97.822 Non-pressure chronic ulcer of other part of left lower leg with fat layer exposed L97.512 Non-pressure chronic ulcer of other part of right foot with fat layer exposed I10 Essential (primary) hypertension L97.811 Non-pressure chronic ulcer of other part of right lower leg limited to breakdown of skin Follow-up Appointments o Return Appointment in 1 week. o Nurse Visit as needed [place]: - [facility]-see dressing changes notes on powder between toes--also dressing 4 x week total o [place] for wound care. May utilize formulary equivalent dressing for wound treatment orders unless otherwise specified. Home Health Nurse may visit PRN to address patientos wound care needs. - frequency 3 times per week - patient will be seen once at wound center - home health to see patient 2 times per week o Scheduled days for dressing changes to be completed; exception, patient has scheduled wound care visit that day. o **Please direct any NON-WOUND related issues/requests for orders to patient's Primary Care Physician. **If current dressing causes regression in wound condition, may D/C ordered dressing product/s and apply Normal Saline Moist Dressing daily until next Tensed or Other MD appointment. **Notify Wound Healing Center of regression in wound condition at [phone]. Bathing/ Shower/ Hygiene o May shower with wound dressing protected with water repellent cover or cast protector. o No tub bath. Edema Control - Lymphedema / Segmental Compressive Device / Other Bilateral Lower Extremities o Optional: One layer of unna paste to top of compression wrap (to act as an anchor). - Unna paste on calf to secure wrap in place as  needed o 4 Layer Compression System Lymphedema. - ****bilateral legs-total of 4 x per week bi lat, nystatin powder between toes on bilateral toes, 2x2 gauze between toes only needed on left side, apply zetuvits/ or formulary for extra absorbant dressing around ankles and foot area, use ABD to cover toes; 3 times per week with  HH and 1 time at wound center [place], LIGHT TAN SPIRAL. WHITE WITH YELLOW LINE FIGURE 8 , COBAN SPIRAL o Elevate, Exercise Daily and Avoid Standing for Long Periods of Time. o Elevate legs to the level of the heart and pump ankles as often as possible o Elevate leg(s) parallel to the floor when sitting. o Compression Pump: Use compression pump on left lower extremity for 60 minutes, twice daily. - 2 times per day o DO YOUR BEST to sleep in the bed at night. DO NOT sleep in your recliner. Long hours of sitting in a recliner leads to swelling of the legs and/or potential wounds on your backside. o Other: - Contact prescriber regarding use of diuretics to reduce fluid overload. Off-Loading o Turn and reposition every 2 hours Additional Orders / Instructions o Follow Nutritious Diet and Increase Protein Intake Electronic Signature(s) Signed: 06/21/2021 3:22:57 PM By: Donnamarie Poag Signed: 06/21/2021 3:27:38 PM By: Irean Hong Matamoras, [place] ([id number]) Entered By: Donnamarie Poag on 06/21/2021 14:25:47 [name] ([id number]) -------------------------------------------------------------------------------- Problem List Details Patient Name: [name] Date of Service: 06/21/2021 1:45 PM Medical Record Number: [id number] Patient Account Number: [id number] Date of Birth/Sex: 08-Oct-1950 (71 y.o. M) Treating RN: Donnamarie Poag Primary Care Provider: Felipa Eth Other Clinician: Referring Provider: Felipa Eth Treating Provider/Extender: Skipper Cliche in Treatment: 20 Active Problems ICD-10 Encounter Code Description Active  Date MDM Diagnosis E11.622 Type 2 diabetes mellitus with other skin ulcer 01/26/2021 No Yes I89.0 Lymphedema, not elsewhere classified 01/26/2021 No Yes I87.2 Venous insufficiency (chronic) (peripheral) 01/26/2021 No Yes L97.822 Non-pressure chronic ulcer of other part of left lower leg with fat layer 01/26/2021 No Yes exposed L97.512 Non-pressure chronic ulcer of other part of right foot with fat layer 01/26/2021 No Yes exposed [place] (primary) hypertension 01/26/2021 No Yes L97.811 Non-pressure chronic ulcer of other part of right lower leg limited to 02/15/2021 No Yes breakdown of skin Inactive Problems Resolved Problems Electronic Signature(s) Signed: 06/21/2021 2:03:10 PM By: Worthy Keeler PA-C Entered By: Worthy Keeler on 06/21/2021 14:03:10 [name] ([id number]) -------------------------------------------------------------------------------- Progress Note Details Patient Name: [name] Date of Service: 06/21/2021 1:45 PM Medical Record Number: [id number] Patient Account Number: [id number] Date of Birth/Sex: 1951-05-06 (71 y.o. M) Treating RN: Donnamarie Poag Primary Care Provider: Felipa Eth Other Clinician: Referring Provider: Felipa Eth Treating Provider/Extender: Skipper Cliche in Treatment: 20 Subjective Chief Complaint Information obtained from Patient Left LE ulcers History of Present Illness (HPI) 71 year old male who presented to the ER with bilateral lower extremity blisters which had started last week. he has a past medical history of leukemia, diabetes mellitus, hypertension, edema of both lower extremities, his recurrent skin infections, peripheral vascular disease, coronary artery disease, congestive heart failure and peripheral neuropathy. in the ER he was given Rocephin and put on Silvadene cream. he was put on oral doxycycline and was asked to follow-up with the [facility]. His last hemoglobin A1c was 6.6 in December and he checks his  blood sugar once a week. He does not have any physicians outside the [place] system. He does not recall any vascular duplex studies done either for arterial or venous disease but was told to wear compression stockings which he does not use 05/30/2016 -- we have not yet received any of his notes from the [facility] hospital system and his arterial and venous duplex studies are scheduled here in [place] around mid February. We are unable to have his insurance accepted by home health agencies and hence  he is getting dressings only once a week. 06/06/16 -- -- I received a call from the patient's PCP at the [facility] at [facility] and spoke to Dr. Garvin Fila, phone number [phone] and fax number [phone]. She confirmed that no vascular testing was done over the last 5 years and she would be happy to do them if the patient did want them to be done at the [place] and we could fax him a request. Readmission: 71 year old male seen by as in February of this year and was referred to vein and vascular for studies and opinion from the vascular surgeons. The patient returns today with a fresh problem having had blisters on his left lower extremity which have been there for about 5 days and he clearly states that he has been wearing his compression stockings as advised though he could not read the moderate compression and has been wearing light compression. Review of his electronic medical records note that he had lower extremity arterial duplex examination done on 06/23/2016 which showed no hemodynamically significant stenosis in the bilateral lower extremity arterial system. He also had a lower extremity venous reflux examination done on 07/07/2016 and it was noted that he had venous incompetence in the right great saphenous vein and bilateral common femoral veins. Patient was seen by Dr. Tamala Julian on the same day and for some reason his notes do not reflect the venous studies or the arterial studies and he recommended  patient do a venous duplex ultrasound to look for reflux and return to see him.he would also consider a lymph pump if required. The patient was told that his workup was normal and hence the patient canceled his follow-up appointment. 02/03/17 on evaluation today patient left medial lower extremity blister appears to be doing about the same. It is still continuing to drain and there's still the blistered skin covering the wound bed which is making it difficult for the alternate to do its job. Fortunately there is no evidence of cellulitis. No fevers chills noted. Patient states in general he is not having any significant discomfort. Patient's lower extremity arterial duplex exam revealed that patient was hemodynamically stable with no evidence of stenosis in regard to the bilateral lower extremities. The lower extremity venous reflux exam revealed the patient had venous incontinence noted in the right greater saphenous and bilateral common femoral vein. There is no evidence of deep or superficial vein thrombosis in the bilateral lower extremities. Readmission: 11/12/18 Patient presents for evaluation our clinic today concerning issues that he is having with his left lower extremity. He tells me that a couple weeks ago he began developing blisters on the left lower extremity along with increased swelling. He typically wears his compression stockings on a regular basis is previously been evaluated both here as well is with vascular surgery they would recommend lymphedema pumps but unfortunately that somehow fell through and he never heard anything back from that. Nonetheless I think lymphedema pumps would be beneficial for this patient. He does have a history of hypertension and diabetes. Obviously the chronic venous stasis and lymphedema as well. At this point the blisters have been given in more trouble he states sometimes when the blisters openings able to clean it down with alcohol and it will dry  out and do well. Unfortunately that has not been the case this time. He is having some discomfort although this mean these with cleaning the areas he doesn't have discomfort just on a regular basis. He has not been able to wear  his compression stockings since the blisters arose due to the fact that of course it will drain into the socks causing additional issues and he didn't have any way to wrap this otherwise. He has increased to taking his Lasix every day instead of every other day. He sees his primary care provider later this month as well. No fevers, chills, nausea, or vomiting noted at this time. 11/19/18-Patient returns at 1 week, per intake RN the amount of seepage into the compression wraps was definitely improved, overall all the wounds are measuring smaller but continuing silver alginate to the wounds as primary dressing 11/26/18 on evaluation today patient appears to be doing quite well in regard to his left lower Trinity ulcers. In fact of the areas that were noted initially he only has two regions still open. There is no evidence of active infection at this time. He still is not heard anything from the company regarding lymphedema pumps as of yet. Again as previously seen vascular they have not recommended any surgical intervention. [name], [name] ([id number]) 12/03/2018 on evaluation today patient actually appears to be doing quite well with regard to his lower extremity ulcers. In fact most of the areas appear to be healed the one spot which does not seem to be completely healed I am unsure of whether or not this is really draining that much but nonetheless there does not appear to be any signs of infection or significant drainage at this point. There is no sign of fever, chills, nausea, vomiting, or diarrhea. Overall I am pleased with how things have progressed I think is very close to being able to transition to his home compression stockings. 12/10/2018 upon evaluation today patient  appears to be doing quite well with regard to his left lower extremity. He has been tolerating the dressing changes without complication. Fortunately there is no signs of active infection at this time. He appears after thorough evaluation of his leg to only have 1 small area that remains open at this point everything else appears to be almost completely closed. He still have significant swelling of the left lower extremity. We had discussed discussing this with his primary care provider he is not able to see her in person they were at the [facility] and right now the [place] is not seeing patients on site. According to the patient anyway. Subsequently he did speak with her apparently and his primary care provider feels that he may likely have a DVT. With that being said she has not seen his leg she is just going off of his history. Nonetheless that is a concern that the patient now has as well and while I do not feel the DVT is likely we can definitely ensure that that is not the case I will go ahead and see about putting that order in today. Nonetheless otherwise I am in a recommend that we continue with the current wound care measures including the compression therapy most likely. We just need to ensure that his leg is indeed free of any DVTs. 12/17/2018 on evaluation today patient actually appears to be completely healed today. He does have 2 very small areas of blistering although this is not anything too significant at this point which is good news. With that being said I am in agreement with the fact that I think he is completely healed at this point. He does want to get back into his compression stocking. The good news is we have gotten approval from insurance for his lymphedema  pumps we received a letter since last saw him last week. The other good news is his study did come back and showed no evidence of a DVT. 12/20/2018 on evaluation today patient presents for follow-up concerning his ongoing  issues with his left lower extremity. He was actually discharged last Friday and did fairly well until he states blisters opened this morning. He tells me he has been wearing his compression stocking although he has a hard time getting this on. There does not appear to be any signs of active infection at this time. No fevers, chills, nausea, vomiting, or diarrhea. 12/27/2018 on evaluation today patient appears to be doing very well with regard to his swelling of the left lower extremity the 4 layer compression wrap seems to have been beneficial for him. Fortunately there is no signs of active infection at this time. Patient has been tolerating the compression wrap without complication and his foot swelling in particular appears to be greatly improved. He does still have a wound on the lateral portion of his left leg I believe this is more of a blister that has now reopened. 01/03/2019 on evaluation today patient actually appears to be doing excellent in regard to his left lower extremity. He did receive his compression pumps and is actually use this 7 times since he was last here in the office. On top of the compression wrap he is now roughly 3 cm better at the calf and 2 cm better at the ankle he also states that his foot seem to go an issue better without even having to use a shoe horn. Obviously I think this is all evidence that he is doing excellent in this regard. The other good news is he does not appear to have anything open today as far as wounds are concerned. 01/15/2019 on evaluation today patient appears to be doing more poorly yet again with regard to his left lower extremity. He has developed new wounds again after being discharged just recently. Unfortunately this continues to be the case that he will heal and then have subsequent new wounds. The last time I was hopeful that he may not end up coming back too quickly especially since he states he has been using his lymphedema pumps along  with wearing his compression. Nonetheless he had a blister on the back of his leg that popped up on the left and this has opened up into an ulceration it is quite painful. 01/22/19 on evaluation today patient actually appears to be doing well with regard to his wound on the left lower extremity. He's been tolerating the dressing changes without complication including the compression wrap in the wound appears to be significantly smaller today which is great news. Overall very pleased in this regard. 01/29/2019 on evaluation today patient appears to be doing well with regard to his left posterior lower extremity ulcer. He has been tolerating the dressing changes without complication. This is not completely healed but is getting much closer. We did order a Farrow wrap 4000 for him he has received this and has it with him today although I am not sure we are quite ready to start him on that as of yet. We are very close. 02/05/2019 on evaluation today patient actually appears to be doing quite well with regard to his left posterior lower extremity ulcer. He still has a very tiny opening remaining but the fortunate thing is he seems to be healing quite nicely. He also did get his Farrow wrap which I  am hoping will help with his edema control as well at home. Fortunately there is no evidence of active infection. 02/12/2019 patient and fortunately appears to be doing poorly in regard to his wounds of the left lower extremity. He was very close to healing therefore we attempted to use his Velcro compression wraps continuing with lymphedema pumps at home. Unfortunately that does not seem to have done very well for him. He tells me that he wore them all the time but again I am not sure why if that is the case that he is having such significant edema. He is still on his fluid pills as well. With that being said there is no obvious sign of infection although I do wonder about the possibility of infection at this time  as well. 02/19/2019 unfortunately upon evaluation today patient appears to be doing more poorly with regard to his left lower extremity. He is not showing signs of significant improvement and I think the biggest issue here is that he does have an infection that appears to likely be Pseudomonas. That is based on the blue-green drainage that were noted at this time. Unfortunately the antibiotic that has been on is not going to take care of this at all. I think they will get a need to switch him to either Levaquin or Cipro and this was discussed with the patient. 02/26/2019 on evaluation today patient's lower extremity on the left appears to be doing significantly better as compared to last evaluation. Fortunately there is no signs of active infection at this time. He has been tolerating the compression wrap without complication in fact he made it the whole week at this point. He is showing signs of excellent improvement I am very happy in this regard. With that being said he is having some issues with infection we did review the results of his culture which I noted today. He did have a positive finding for Enterobacter as well as Alcaligenes faecalis. Fortunately the Levaquin that I placed him on will work for both which is great news. There is no signs of systemic infection at this point. 10/30; left posterior leg wound in the setting of very significant edema and what looks like chronic venous inflammation. He has compression pumps but does not use them. We have been using 3 layer compression. Silver alginate to the wound as the primary dressing 03/18/2019 on evaluation today patient appears to be doing a little better compared to last time I saw him. He really has not been using his compression pumps he tells me that he is having too much discomfort. He has been keeping his wraps on however. He is only been taking his fluid pills every other day because he states they are not really helping and he has  an appointment with his primary care provider at the [facility] tomorrow. Subsequently the wound itself on the left lower extremity does seem to be greatly improved compared to previous. [name], [name] ([id number]) 03/25/2019 on evaluation today patient appears to be doing better with regard to his wounds on the bilateral lower extremities. The left is doing excellent the right is also doing better although both still do show some signs of open wounds noted at this point unfortunately. Fortunately there is no signs of active infection at this time. The patient also is not really having any significant pain which is good news. Unfortunately there was some confusion with the referral on vascular disease and as far as getting the patient scheduled there can  be contacting him later today to do this fortunately we got this straightened out. 04/01/2019 on evaluation today patient appears to be doing no fevers, chills, nausea, vomiting, or diarrhea. Excellent at this time with regard to his lower extremities. There does not appear to be any open wound at this point which is good news. Fortunately is also no signs of active infection at this time. Overall feel like the patient has done excellent with the compression the problem is every time we got him to this point and then subsequently go to using his own compression things just go right back to where they were. I am not sure how to address this we can try to get an appointment with vascular for 2 weeks now they have yet to call him. Obviously this has become frustrating for the patient as well. I think the issue has just been an honest error as far as scheduling is concerned but nonetheless still worn out the point where I am unsure of which direction we should take. 04/08/2019 on evaluation today patient actually appears to be doing well with regard to his lower extremities. There are no open wounds at this time and things seem to be managing quite nicely as far  as the overall edema control is concerned. With that being said he does have his compression socks today for [place] to go ahead and reinitiate therapy in that manner at this point. He is going to be going for shoes to be measured on Wednesday and then coincidentally he will also be seeing vascular on Thursday. Overall I think this is good news and again I am hopeful that they will be able to do something for him to help prevent ongoing issues with edema control as well. No fevers, chills, nausea, vomiting, or diarrhea. 04/11/2019 on evaluation today patient actually appears to be doing poorly after just being discharged on Monday of this week. He had been experiencing issues with again blisters especially on the left lower extremity. With that being said he was completely healed and appeared to be doing great this past Monday. He then subsequently has new blisters that formed before his appointment with vascular this morning. He was also measured for shoes in the interim. With that being said we may have figured out what exactly is going on and why he continues to have issues like what we are seeing at this point. He takes his compression stockings off at nighttime and then he ends up having to sleep in his chair for 5-6 hours a night. He sleeps with his feet down he cannot really get him up in the recliner and therefore he is sleeping and the worst possible his position with his feet on the floor for that majority of the time. Again as I explained to him that is about one third at minimum at least one fourth of his day that he spending with his feet dangling down on the ground and the worst possible position they could be. I think this may be what is causing the issue. Subsequently I am leaning toward thinking that he may need a hospital bed in order to elevate his legs. We likely can have to coordinate this with his primary care provider at the [facility]. Readmission: 01/26/2021 this is a patient who  presents for repeat evaluation here in the clinic although it is actually been couple of years since have seen him in fact it was December 2020 when I last saw him. Subsequently he never  really healed but did end up being lost to follow-up. He tells me has been having issues ongoing with his lower extremities has bilateral lower extremity lymphedema no real significant or definitive open wounds but in general his lymphedema is way out of control. We were never able to refer him to lymphedema clinic simply due to the fact to be honest we were never able to get him completely healed. I do not see anyone with open wounds. The patient does have evidence of type 2 diabetes mellitus, lymphedema, chronic venous insufficiency, and hypertension. That really has not changed since his last evaluation. 02/09/2021 upon evaluation today patient appears to be doing a little better in regard to his legs although he still having a tremendous amount of drainage especially on the left leg. Fortunately there does not appear to be any evidence of active infection. Of note when we looked into this further it appears that the patient did not have any absorptive dressing on it was just the 4-layer compression wrap. Nonetheless this is probably big part of the issue here. 10/10; he comes in today with 3 large areas on the upper right lower leg likely remanence of denuded blistering under his compression wraps. He has no other wounds on the right. On the left he has the denuded area on the left medial foot and ankle and on the left dorsal foot. Massive lymphedema in both feet dorsally. Using Zetuvit under compression We have increased home health visitation to twice a week to change the dressings and will change it once 02/22/2021 upon evaluation today patient appears to be doing well currently with regard to his wounds. He has been tolerating the dressing changes without complication. Fortunately there does not appear to be  any evidence of active infection which is great news. No fevers, chills, nausea, vomiting, or diarrhea. The biggest issue I see currently is that home health is not putting any medicine on the actual wounds before wrapping. 03/01/2021 upon evaluation today the patient's right leg actually appears to be doing quite well which is great news there does not appear to be any evidence of active infection at this time. No fevers, chills, nausea, vomiting, or diarrhea. With that being said the patient is having issues on the left foot where he is having significant drainage is also an ammonia smell he does not have any animals at home and this makes me concerned about a bacteria producing urea as a byproduct. Again the possible common organisms will be E. coli, Proteus, and Enterococcus. All 3 of which can be successfully treated with Levaquin. For that reason I think that this may be a good option for [place] to consider placing him on and I did obtain a culture as well for confirmation sake. 03/08/2021 upon evaluation today patient appears to be doing unfortunately still somewhat poorly in regard to his leg ulcerations. He actually has an area on the right leg where he blistered due to the fact that his wrap slid down and caused an area of pinching on his skin and this has led to a significant issue here. 03/15/2021 upon evaluation today patient unfortunately has not been wrapped appropriately with absorptive dressings nor with the appropriate technique for the third layer of the 4-layer compression wrap. These are issues that we continue to try to address with the home health nurse. Also the absorptive dressing that she had was cut in half and therefore that causes things to leak out it does not actually trap the fluid  in regard to the top of the foot overall I think that all these combined are really not seeing things improved significantly here. Fortunately there does not appear to be any signs of significant  infection at this time which is good news. He still is having a tremendous amount of drainage. 03/22/2021 upon evaluation today patient appears to be draining tremendously. He still continues to tell me that he is using his pumps 2 times a day and that coupled with that tells me that he is elevating his legs as well. With that being said all things considered I am really just not seeing the improvement we would expect to see with the 4-layer compression wrap and all the above noted. He in fact had an extremely large Zetuvit dressing on both legs and that they were extremely filled to the max with fluid. This is after just being changed just before the weekend and this [name], [name] ([id number]) is Monday. Nonetheless I am concerned about the fact that there is something going on fairly significant that we cannot get any of this under control and that he is draining this significantly. He supposed be having an echocardiogram it sounds like scheduling has been an issue for him as far as getting in sooner. Its something to do with needing his cousin to drive him because of where it sat and he cannot drive himself to this appointment either way I really think he needs to try to see what he can do about making this happen a little sooner. He tells me he will call today. 03/29/2021 on evaluation today patient appears to be doing about the same in regard to his legs. He did get his cardiology appointment moved up to 6 December which is at least good that is better than what it was before mid December. Overall very pleased in that regard. 04/05/2021 upon evaluation today patient unfortunately is still doing fairly poorly. There does not appear to be any signs of active infection at this time. No fevers, chills, nausea, vomiting, or diarrhea. Unfortunately I think until his edema is under control and overall fluid overload there is really not to be much chance that I can do much to get him better. This is quite  unfortunate and frustrating both for myself and the patient to be perfectly honest. Nonetheless I think that he really needs to have a conversation both with his primary care provider as well as cardiologist he sees the PCP on Monday and cardiology on Wednesday of next week. 04/13/2021 upon evaluation today patient appears to be doing poorly in regard to his bilateral lower extremities his left is still worse than the right. With that being said he has a tremendous amount of drainage he did see his primary care provider yesterday there really was not much there to be done from their perspective. He sees cardiology tomorrow. Nonetheless my biggest concern here is simply that if we do not get the edema under control he is going to continue to have drainage and honestly I think at some point he is going to become infected severely that is my main concern. 04/19/2021 upon evaluation today patient appears to be doing poorly still in regard to his legs. Unfortunately there does not appear to be any signs of infection at this point. He does have a tremendous amount of drainage however. We have not seen the results back from the cardiologist and the echocardiogram that was done. It appears that the patient checked out okay as far  as that is concerned with regard to ejection fraction though we still have some issues here to be honest with his diastolic function. I am unsure if this is accounting for everything that we are seeing or not. Either way he has a tremendous amount of drainage from his legs that we are just not able to control in the outpatient setting at this point. I have reached out to Dr. Rockey Situ his cardiologist to see once he reviews the sheet if there is anything that he feels like can be done from an outpatient perspective if not then I think the way to go is probably can to be through inpatient admission and diuresis. Otherwise I am not sure how working to get this under control we tried  antibiotics, compression wrapping, and I have told the patient to be elevating his legs I am not sure how much he does of this but either way I think that this is still an ongoing issue nonetheless. 04/26/2021 upon evaluation today patient appears to be doing poorly in regard to his legs. He is having a tremendous amount of fluid at this point which is quite unfortunate. Its to the point that he may have had at least 5 to 10 pounds of fluid in his dressings this morning when they were removed these were changed this Friday. Subsequently I think he needs to go to the ER for further evaluation and treatment I think is probably can need diuresis possibly even IV antibiotics been on what the blood work looks like but in general I feel like he needs something to get this under control from an outpatient perspective absent of everywhere I can think of and I cannot get this under control with our traditional measures. I think this is going require more so that we can get him better 12/30; this is a patient with severe bilateral lymphedema. He was hospitalized from 04/26/2021 through 04/29/2021 treated for cellulitis in the setting of lower extremity ulcers and lymphedema. After he left the hospital he is apparently seen for nurse visit our staff contacted cardiology and he has been started on Lasix 40 mg. Apparently his legs have less edema. Lab work from 05/04/2021 showed a BUN of 38 and creatinine of 1.59 these are elevated versus previous where his creatinine seems to have been 1.30 on 12/19 his potassium is 4.3. I believe the lab work is being followed by cardiology We have him in a 4-layer wrap. Xeroform on the leg wounds and sit to fit on the Berry damage skin on the left dorsal foot versus right dorsal foot. He has compression pumps but does not use them. We have apparently not yet ordered him compression stockings 05/17/2021 upon evaluation today patient's legs though better than last time I personally  saw him appear to be getting worse compared to where they were previous. Dr. Quentin Cornwall was actually last 1 to see you I have not seen him since 19 December. That was before he went into the ER. Coming out apparently his legs looked also and they still look better but not as good as they were in the past. 1/16; patient with severe bilateral lymphedema. Severe scaled hyperkeratotic skin on the dorsal aspect of his distal left foot and left medial ankle.. On the right side changes are not as bad. He did not have any weeping edema. Our intake nurse was convinced that he is being compliant with compression pumps 1 hour twice a day 05/31/2021 upon evaluation today patient actually appears to  be doing a little bit better in my opinion in regard to his feet. I do not see as much drainage and it being just completely wet as it was previous. Fortunately I do not also see any signs of active infection which is great news as well. 06/07/21 Upon inspection patient's wound bed actually showed signs of doing well he is not nearly as weepy and wet as he has been in the past and overall very pleased in that regard. Fortunately I do not see any signs of active infection locally or nor systemically at this time. Which is great news. No fevers, chills, nausea, vomiting, or diarrhea. 06/14/2021 upon evaluation today patient appears to be doing well with regard to his right foot I am pleased in that regard. His left foot is still draining quite a bit despite using lymphedema pumps, 4-layer compression wraps, and he tells me elevating his legs as well. He also has Lasix that he takes twice a day. Nonetheless I believe that this is still good to be an ongoing issue. We have a hard time getting this under control as far as the swelling is concerned. 06/21/2021 upon evaluation today patient appears to be doing decently well in regard to his wounds all things considered. He still has a tremendous amount of drainage and fluid noted  at this point. Fortunately I do not see any signs of active infection locally or systemically at this point which is great news. Nonetheless I am unsure where to go and how to do this as far as trying to limit his swelling and weeping from his toes in particular. [name], [name] ([id number]) Objective Constitutional Well-nourished and well-hydrated in no acute distress. Vitals Time Taken: 1:55 PM, Height: 73 in, Weight: 312 lbs, BMI: 41.2, Temperature: 98.5 F, [name]: 73 bpm, Respiratory Rate: 16 breaths/min, Blood Pressure: 133/77 mmHg. Respiratory normal breathing without difficulty. Psychiatric this patient is able to make decisions and demonstrates good insight into disease process. Alert and Oriented x 3. pleasant and cooperative. General Notes: Upon inspection patient's wound bed actually showed signs of doing quite well. He has a lot of weeping around the toes but his legs in general appear to be doing excellent otherwise. I do think that he would benefit from a better pair of shoes I did give him some information in that regard today. Other Condition(s) Patient presents with Lymphedema located on the Left Leg. Patient presents with Lymphedema located on the Right Leg. 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 Post procedure Diagnosis Wound #: Same as Pre-Procedure There was a Four Layer Compression Therapy Procedure by Donnamarie Poag, RN. Post procedure Diagnosis Wound #: Same as  Pre-Procedure Plan Follow-up Appointments: Return Appointment in 1 week. Nurse Visit as needed Home Health: [place]: - [facility]-see dressing changes notes on powder between toes--also dressing 4 x week total [place] for wound care. May utilize formulary equivalent dressing for wound treatment orders unless otherwise specified. Home Health Nurse may visit PRN to address patient s wound care needs. - frequency 3 times per week - patient will be  seen once at wound center - home health to see patient 2 times per week Scheduled days for dressing changes to be completed; exception, patient has scheduled wound care visit that day. **Please direct any NON-WOUND related issues/requests for orders to patient's Primary Care Physician. **If current dressing causes regression in wound condition, may D/C ordered dressing product/s and apply Normal Saline Moist Dressing daily until next [place] or Other MD [place], [name] ([id number]) appointment. **Notify Wound Healing Center of regression in wound condition at [phone]. Bathing/ Shower/ Hygiene: May shower with wound dressing protected with water repellent cover or cast protector. No tub bath. Edema Control - Lymphedema / Segmental Compressive Device / Other: Optional: One layer of unna paste to top of compression wrap (to act as an anchor). - Unna paste on calf to secure wrap in place as needed 4 Layer Compression System Lymphedema. - ****bilateral legs-total of 4 x per week bi lat, nystatin powder between toes on bilateral toes, 2x2 gauze between toes only needed on left side, apply zetuvits/ or formulary for extra absorbant dressing around ankles and foot area, use ABD to cover toes; 3 times per week with HH and 1 time at wound center [place], LIGHT TAN SPIRAL. WHITE WITH YELLOW LINE FIGURE 8 , COBAN SPIRAL Elevate, Exercise Daily and Avoid Standing for Long Periods of Time. Elevate legs to the level of  the heart and pump ankles as often as possible Elevate leg(s) parallel to the floor when sitting. Compression Pump: Use compression pump on left lower extremity for 60 minutes, twice daily. - 2 times per day DO YOUR BEST to sleep in the bed at night. DO NOT sleep in your recliner. Long hours of sitting in a recliner leads to swelling of the legs and/or potential wounds on your backside. Other: - Contact prescriber regarding use of diuretics to reduce fluid overload. Off-Loading: Turn and reposition every 2 hours Additional Orders / Instructions: Follow Nutritious Diet and Increase Protein Intake 1. Would recommend currently that we actually continue with the nystatin and the gauze currently home health did not do that as they said they did not have the order. Nonetheless it does appear that they should have the order it was written out last week and sent to them. I am not sure what happened in that regard. With that being said we will see how things do over the next week. 2. I am also going to consider possibly using drawtex going forward. I am trying on another patient I want to see how it does for him because they are in a very similar situation and depending on how we respond over the next week we may initiate that at that point. We will see patient back for reevaluation in 1 week here in the clinic. If anything worsens or changes patient will contact our office for additional recommendations. Electronic Signature(s) Signed: 06/21/2021 5:08:27 PM By: Worthy Keeler PA-C Entered By: Worthy Keeler on 06/21/2021 17:08:27 [name] ([id number]) -------------------------------------------------------------------------------- SuperBill Details Patient Name: [name] Date of Service: 06/21/2021 Medical Record Number: [id number] Patient Account Number: [id number] Date of Birth/Sex: 1950/10/15 (71 y.o. 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 Physician Procedures CPT4 Code: 4818563 Description: 14970 - WC PHYS LEVEL 4 - EST PT Modifier: Quantity: 1 CPT4 Code: Description: ICD-10 Diagnosis Description E11.622 Type 2 diabetes mellitus with other skin ulcer I89.0 Lymphedema, not elsewhere classified I87.2 Venous insufficiency (chronic) (peripheral) L97.822 Non-pressure chronic ulcer of other part of left lower  leg with fat layer Modifier: exposed Quantity: Electronic Signature(s) Signed: 06/21/2021 5:09:01 PM By: Worthy Keeler PA-C Previous Signature: 06/21/2021 3:22:57 PM Version By: Donnamarie Poag Previous Signature: 06/21/2021 3:27:38 PM Version By: Worthy Keeler PA-C Entered By: Worthy Keeler on 06/21/2021 17:09:01

## 2021-06-21 NOTE — Progress Notes (Signed)
Mooney, [name] ([id number]) Visit Report for 06/21/2021 Arrival Information Details Patient Name: [name], MCGILLICUDDY Date of Service: 06/21/2021 1:45 PM Medical Record Number: [id number] Patient Account Number: [id number] Date of Birth/Sex: 08/17/1950 (71 y.o. M) Treating RN: Donnamarie Poag Primary Care Raffael Bugarin: Felipa Eth Other Clinician: Referring Yarrow Linhart: Felipa Eth Treating Joby Richart/Extender: Skipper Cliche in Treatment: 20 Visit Information History Since Last Visit Added or deleted any medications: No Patient Arrived: [name] Had a fall or experienced change in No Arrival Time: 13:50 activities of daily living that may affect Accompanied By: self risk of falls: Transfer Assistance: [employer] Patient Lift Hospitalized since last visit: No Patient Identification Verified: Yes Has Dressing in Place as Prescribed: Yes Secondary Verification Process Completed: Yes Has Compression in Place as Prescribed: Yes Patient Requires Transmission-Based No Pain Present Now: No Precautions: Patient Has Alerts: Yes Patient Alerts: [facility] consult on file Last ABI-R 1.09; L 1.04 Electronic Signature(s) Signed: 06/21/2021 3:22:57 PM By: Donnamarie Poag Entered ByDonnamarie Poag on 06/21/2021 13:55:47 [name] ([id number]) -------------------------------------------------------------------------------- Compression Therapy Details Patient Name: [name] Date of Service: 06/21/2021 1:45 PM Medical Record Number: [id number] Patient Account Number: [id number] Date of Birth/Sex: 07-18-1950 (71 y.o. M) Treating RN: Donnamarie Poag Primary Care [name]: Felipa Eth Other Clinician: Referring Jorian Willhoite: Felipa Eth Treating [name]/Extender: Skipper Cliche in Treatment: 20 Compression Therapy Performed for Wound Assessment: NonWound Condition Lymphedema - Left Leg Performed By: Junius Argyle, RN Compression Type: Four Layer Post Procedure Diagnosis Same as  Pre-procedure Electronic Signature(s) Signed: 06/21/2021 3:22:57 PM By: Donnamarie Poag Entered By: Donnamarie Poag on 06/21/2021 14:20:45 [name] ([id number]) -------------------------------------------------------------------------------- Compression Therapy Details Patient Name: [name] Date of Service: 06/21/2021 1:45 PM Medical Record Number: [id number] Patient Account Number: [id number] Date of Birth/Sex: Jul 28, 1950 (71 y.o. M) Treating RN: Donnamarie Poag Primary Care [name]: Felipa Eth Other Clinician: Referring [name]: Felipa Eth Treating Miakoda Mcmillion/Extender: Skipper Cliche in Treatment: 20 Compression Therapy Performed for Wound Assessment: NonWound Condition Lymphedema - Right Leg Performed By: Junius Argyle, RN Compression Type: Four Layer Post Procedure Diagnosis Same as Pre-procedure Electronic Signature(s) Signed: 06/21/2021 3:22:57 PM By: Donnamarie Poag Entered By: Donnamarie Poag on 06/21/2021 14:21:28 [name] ([id number]) -------------------------------------------------------------------------------- Encounter Discharge Information Details Patient Name: [name] Date of Service: 06/21/2021 1:45 PM Medical Record Number: [id number] Patient Account Number: [id number] Date of Birth/Sex: [birth year] (71 y.o. M) Treating RN: Donnamarie Poag Primary Care [name]: Felipa Eth Other Clinician: Referring [name]: Felipa Eth Treating Latona Krichbaum/Extender: Skipper Cliche in Treatment: 20 Encounter Discharge Information Items Discharge Condition: Stable Ambulatory Status: Cane Discharge Destination: Home Transportation: Other Accompanied By: self Schedule Follow-up Appointment: Yes Clinical Summary of Care: Electronic Signature(s) Signed: 06/21/2021 3:22:57 PM By: Donnamarie Poag Entered By: Donnamarie Poag on 06/21/2021 14:44:09 [name] ([id number]) -------------------------------------------------------------------------------- Lower  Extremity Assessment Details Patient Name: [name] Date of Service: 06/21/2021 1:45 PM Medical Record Number: [id number] Patient Account Number: [id number] Date of Birth/Sex: 18-Nov-1950 (71 y.o. M) Treating RN: Donnamarie Poag Primary Care [name]: Felipa Eth Other Clinician: Referring [name]: Felipa Eth Treating Geri Hepler/Extender: Jeri Cos Weeks in Treatment: 20 Edema Assessment Assessed: [Left: Yes] [Right: Yes] Edema: [Left: Yes] [Right: Yes] Calf Left: Right: Point of Measurement: 35 cm From Medial Instep 40 cm 39 cm Ankle Left: Right: Point of Measurement: 10 cm From Medial Instep 35 cm 32 cm Vascular Assessment Pulses: Dorsalis Pedis Palpable: [Left:No] [Right:No] Electronic Signature(s) Signed: 06/21/2021 3:22:57 PM By: Donnamarie Poag Entered By: Donnamarie Poag on 06/21/2021 14:13:28 [name] ([id number]) -------------------------------------------------------------------------------- Multi Wound Chart Details Patient Name:  [name] Date of Service: 06/21/2021 1:45 PM Medical Record Number: [id number] Patient Account Number: [id number] Date of Birth/Sex: Jun 27, 1950 (71 y.o. M) Treating RN: Donnamarie Poag Primary Care [name]: Felipa Eth Other Clinician: Referring [name]: Felipa Eth Treating [name]/Extender: Skipper Cliche in Treatment: 20 Vital Signs Height(in): 73 Pulse(bpm): 73 Weight(lbs): 312 Blood Pressure(mmHg): 133/77 Body Mass Index(BMI): 41.2 Temperature(F): 98.5 Respiratory Rate(breaths/min): 16 Wound Assessments Treatment Notes Electronic Signature(s) Signed: 06/21/2021 3:22:57 PM By: Donnamarie Poag Entered By: Donnamarie Poag on 06/21/2021 14:14:51 [name] ([id number]) -------------------------------------------------------------------------------- [name] Details Patient Name: [name] Date of Service: 06/21/2021 1:45 PM Medical Record Number: [id number] Patient Account Number:  [id number] Date of Birth/Sex: 08/19/1950 (71 y.o. M) Treating RN: Donnamarie Poag Primary Care Stewart Sasaki: Felipa Eth Other Clinician: Referring Isabella [name]: Felipa Eth Treating Desmon Hitchner/Extender: Jeri Cos Weeks in Treatment: 20 Active Inactive Electronic Signature(s) Signed: 06/21/2021 3:22:57 PM By: Donnamarie Poag Entered By: Donnamarie Poag on 06/21/2021 14:14:39 [name] ([id number]) -------------------------------------------------------------------------------- Non-Wound Condition Assessment Details Patient Name: [name] Date of Service: 06/21/2021 1:45 PM Medical Record Number: [id number] Patient Account Number: [id number] Date of Birth/Sex: 1950/09/18 (71 y.o. M) Treating RN: Donnamarie Poag Primary Care Jahari Wiginton: Felipa Eth Other Clinician: Referring Orlando Devereux: Felipa Eth Treating Marciana Uplinger/Extender: Jeri Cos Weeks in Treatment: 20 Non-Wound Condition: Condition: Lymphedema Location: Leg Side: Left Notes: Anterior aspect of lower leg Photos Electronic Signature(s) Signed: 06/21/2021 3:22:57 PM By: Donnamarie Poag Entered By: Donnamarie Poag on 06/21/2021 14:13:58 [name] ([id number]) -------------------------------------------------------------------------------- Non-Wound Condition Assessment Details Patient Name: [name] Date of Service: 06/21/2021 1:45 PM Medical Record Number: [id number] Patient Account Number: [id number] Date of Birth/Sex: 06-03-1950 (71 y.o. M) Treating RN: Donnamarie Poag Primary Care Carlton Sweaney: Felipa Eth Other Clinician: Referring Glorie Dowlen: Felipa Eth Treating Orlen Leedy/Extender: Jeri Cos Weeks in Treatment: 20 Non-Wound Condition: Condition: Lymphedema Location: Leg Side: Right Photos Electronic Signature(s) Signed: 06/21/2021 3:22:57 PM By: Donnamarie Poag Entered By: Donnamarie Poag on 06/21/2021 14:14:20 [name] ([id number]) -------------------------------------------------------------------------------- Pain  Assessment Details Patient Name: [name] Date of Service: 06/21/2021 1:45 PM Medical Record Number: [id number] Patient Account Number: [id number] Date of Birth/Sex: 1951-02-28 (71 y.o. M) Treating RN: Donnamarie Poag Primary Care Jerren Flinchbaugh: Felipa Eth Other Clinician: Referring Rock Sobol: Felipa Eth Treating Sande Pickert/Extender: Skipper Cliche in Treatment: 20 Active Problems Location of Pain Severity and Description of Pain Patient Has Paino No Site Locations Rate the pain. Current Pain Level: 0 Pain Management and Medication Current Pain Management: Electronic Signature(s) Signed: 06/21/2021 3:22:57 PM By: Donnamarie Poag Entered By: Donnamarie Poag on 06/21/2021 13:56:38 [name] ([id number]) -------------------------------------------------------------------------------- Patient/Caregiver Education Details Patient Name: [name] Date of Service: 06/21/2021 1:45 PM Medical Record Number: [id number] Patient Account Number: [id number] Date of Birth/Gender: [birth year] (71 y.o. M) Treating RN: Donnamarie Poag Primary Care Physician: Felipa Eth Other Clinician: Referring Physician: Felipa Eth Treating Physician/Extender: Skipper Cliche in Treatment: 20 Education Assessment Education Provided To: Patient Education Topics Provided Wound/Skin Impairment: Electronic Signature(s) Signed: 06/21/2021 3:22:57 PM By: Donnamarie Poag Entered By: Donnamarie Poag on 06/21/2021 14:27:04 [name] ([id number]) -------------------------------------------------------------------------------- [place] Details Patient Name: [name] Date of Service: 06/21/2021 1:45 PM Medical Record Number: [id number] Patient Account Number: [id number] Date of Birth/Sex: [birth year] (71 y.o. M) Treating RN: Donnamarie Poag Primary Care Elyjah Hazan: Felipa Eth Other Clinician: Referring Hagar Sadiq: Felipa Eth Treating Destin Vinsant/Extender: Skipper Cliche in Treatment: 20 Vital  Signs Time Taken: 13:55 Temperature (F): 98.5 Height (in): 73 Pulse (bpm): 73 Weight (lbs): 312 Respiratory Rate (breaths/min): 16 Body Mass Index (BMI): 41.2 Blood Pressure (mmHg): 133/77 Reference Range:  80 - 120 mg / dl Electronic Signature(s) Signed: 06/21/2021 3:22:57 PM By: Donnamarie Poag Entered By: Donnamarie Poag on 06/21/2021 13:56:31

## 2021-06-28 ENCOUNTER — Other Ambulatory Visit: Payer: Self-pay

## 2021-06-28 ENCOUNTER — Encounter: Payer: PPO | Admitting: Physician Assistant

## 2021-06-28 DIAGNOSIS — E11622 Type 2 diabetes mellitus with other skin ulcer: Secondary | ICD-10-CM | POA: Diagnosis not present

## 2021-06-28 NOTE — Progress Notes (Addendum)
ARLYN, BUERKLE ([id number]) Visit Report for 06/28/2021 Chief Complaint Document Details Patient Name: [name], [name] Date of Service: 06/28/2021 1:45 PM Medical Record Number: [id number] Patient Account Number: [id number] Date of Birth/Sex: 10/23/1950 (71 y.o. M) Treating RN: Donnamarie Poag Primary Care Provider: Felipa Eth Other Clinician: Referring Provider: Felipa Eth Treating Provider/Extender: Skipper Cliche in Treatment: 21 Information Obtained from: Patient Chief Complaint Left LE ulcers Electronic Signature(s) Signed: 06/28/2021 2:12:06 PM By: Worthy Keeler PA-C Entered By: Worthy Keeler on 06/28/2021 14:12:06 [name] ([id number]) -------------------------------------------------------------------------------- HPI Details Patient Name: [name] Date of Service: 06/28/2021 1:45 PM Medical Record Number: [id number] Patient Account Number: [id number] Date of Birth/Sex: September 03, 1950 (71 y.o. M) Treating RN: Donnamarie Poag Primary Care Provider: Felipa Eth Other Clinician: Referring Provider: Felipa Eth Treating Provider/Extender: Skipper Cliche in Treatment: 21 History of Present Illness HPI Description: 71 year old male who presented to the ER with bilateral lower extremity blisters which had started last week. he has a past medical history of leukemia, diabetes mellitus, hypertension, edema of both lower extremities, his recurrent skin infections, peripheral vascular disease, coronary artery disease, congestive heart failure and peripheral neuropathy. in the ER he was given Rocephin and put on Silvadene cream. he was put on oral doxycycline and was asked to follow-up with the [facility]. His last hemoglobin A1c was 6.6 in December and he checks his blood sugar once a week. He does not have any physicians outside the [place] system. He does not recall any vascular duplex studies done either for arterial or venous disease but was told to wear compression  stockings which he does not use 05/30/2016 -- we have not yet received any of his notes from the [facility] hospital system and his arterial and venous duplex studies are scheduled here in [place] around mid February. We are unable to have his insurance accepted by home health agencies and hence he is getting dressings only once a week. 06/06/16 -- -- I received a call from the patient's PCP at the [facility] and spoke to Dr. Garvin Fila, phone number [phone] and fax number [phone]. She confirmed that no vascular testing was done over the last 5 years and she would be happy to do them if the patient did want them to be done at the [place] and we could fax him a request. Readmission: 71 year old male seen by as in February of this year and was referred to vein and vascular for studies and opinion from the vascular surgeons. The patient returns today with a fresh problem having had blisters on his left lower extremity which have been there for about 5 days and he clearly states that he has been wearing his compression stockings as advised though he could not read the moderate compression and has been wearing light compression. Review of his electronic medical records note that he had lower extremity arterial duplex examination done on 06/23/2016 which showed no hemodynamically significant stenosis in the bilateral lower extremity arterial system. He also had a lower extremity venous reflux examination done on 07/07/2016 and it was noted that he had venous incompetence in the right great saphenous vein and bilateral common femoral veins. Patient was seen by Dr. Tamala Julian on the same day and for some reason his notes do not reflect the venous studies or the arterial studies and he recommended patient do a venous duplex ultrasound to look for reflux and return to see him.he would also consider a lymph pump if required. The patient was told that his workup  was normal and hence the patient canceled his  follow-up appointment. 02/03/17 on evaluation today patient left medial lower extremity blister appears to be doing about the same. It is still continuing to drain and there's still the blistered skin covering the wound bed which is making it difficult for the alternate to do its job. Fortunately there is no evidence of cellulitis. No fevers chills noted. Patient states in general he is not having any significant discomfort. Patient's lower extremity arterial duplex exam revealed that patient was hemodynamically stable with no evidence of stenosis in regard to the bilateral lower extremities. The lower extremity venous reflux exam revealed the patient had venous incontinence noted in the right greater saphenous and bilateral common femoral vein. There is no evidence of deep or superficial vein thrombosis in the bilateral lower extremities. Readmission: 11/12/18 Patient presents for evaluation our clinic today concerning issues that he is having with his left lower extremity. He tells me that a couple weeks ago he began developing blisters on the left lower extremity along with increased swelling. He typically wears his compression stockings on a regular basis is previously been evaluated both here as well is with vascular surgery they would recommend lymphedema pumps but unfortunately that somehow fell through and he never heard anything back from that. Nonetheless I think lymphedema pumps would be beneficial for this patient. He does have a history of hypertension and diabetes. Obviously the chronic venous stasis and lymphedema as well. At this point the blisters have been given in more trouble he states sometimes when the blisters openings able to clean it down with alcohol and it will dry out and do well. Unfortunately that has not been the case this time. He is having some discomfort although this mean these with cleaning the areas he doesn't have discomfort just on a regular basis. He has not been  able to wear his compression stockings since the blisters arose due to the fact that of course it will drain into the socks causing additional issues and he didn't have any way to wrap this otherwise. He has increased to taking his Lasix every day instead of every other day. He sees his primary care provider later this month as well. No fevers, chills, nausea, or vomiting noted at this time. 11/19/18-Patient returns at 1 week, per intake RN the amount of seepage into the compression wraps was definitely improved, overall all the wounds are measuring smaller but continuing silver alginate to the wounds as primary dressing 11/26/18 on evaluation today patient appears to be doing quite well in regard to his left lower [place] ulcers. In fact of the areas that were noted initially he only has two regions still open. There is no evidence of active infection at this time. He still is not heard anything from the company regarding lymphedema pumps as of yet. Again as previously seen vascular they have not recommended any surgical intervention. 12/03/2018 on evaluation today patient actually appears to be doing quite well with regard to his lower extremity ulcers. In fact most of the areas appear to be healed the one spot which does not seem to be completely healed I am unsure of whether or not this is really draining that much but nonetheless there does not appear to be any signs of infection or significant drainage at this point. There is no sign of fever, chills, nausea, vomiting, or diarrhea. Overall I am pleased with how things have progressed I think is very close to being able to transition  to his home compression stockings. [name], [name] ([id number]) 12/10/2018 upon evaluation today patient appears to be doing quite well with regard to his left lower extremity. He has been tolerating the dressing changes without complication. Fortunately there is no signs of active infection at this time. He appears after  thorough evaluation of his leg to only have 1 small area that remains open at this point everything else appears to be almost completely closed. He still have significant swelling of the left lower extremity. We had discussed discussing this with his primary care provider he is not able to see her in person they were at the [facility] and right now the [place] is not seeing patients on site. According to the patient anyway. Subsequently he did speak with her apparently and his primary care provider feels that he may likely have a DVT. With that being said she has not seen his leg she is just going off of his history. Nonetheless that is a concern that the patient now has as well and while I do not feel the DVT is likely we can definitely ensure that that is not the case I will go ahead and see about putting that order in today. Nonetheless otherwise I am in a recommend that we continue with the current wound care measures including the compression therapy most likely. We just need to ensure that his leg is indeed free of any DVTs. 12/17/2018 on evaluation today patient actually appears to be completely healed today. He does have 2 very small areas of blistering although this is not anything too significant at this point which is good news. With that being said I am in agreement with the fact that I think he is completely healed at this point. He does want to get back into his compression stocking. The good news is we have gotten approval from insurance for his lymphedema pumps we received a letter since last saw him last week. The other good news is his study did come back and showed no evidence of a DVT. 12/20/2018 on evaluation today patient presents for follow-up concerning his ongoing issues with his left lower extremity. He was actually discharged last Friday and did fairly well until he states blisters opened this morning. He tells me he has been wearing his compression stocking although he has a hard  time getting this on. There does not appear to be any signs of active infection at this time. No fevers, chills, nausea, vomiting, or diarrhea. 12/27/2018 on evaluation today patient appears to be doing very well with regard to his swelling of the left lower extremity the 4 layer compression wrap seems to have been beneficial for him. Fortunately there is no signs of active infection at this time. Patient has been tolerating the compression wrap without complication and his foot swelling in particular appears to be greatly improved. He does still have a wound on the lateral portion of his left leg I believe this is more of a blister that has now reopened. 01/03/2019 on evaluation today patient actually appears to be doing excellent in regard to his left lower extremity. He did receive his compression pumps and is actually use this 7 times since he was last here in the office. On top of the compression wrap he is now roughly 3 cm better at the calf and 2 cm better at the ankle he also states that his foot seem to go an issue better without even having to use a shoe horn. Obviously I  think this is all evidence that he is doing excellent in this regard. The other good news is he does not appear to have anything open today as far as wounds are concerned. 01/15/2019 on evaluation today patient appears to be doing more poorly yet again with regard to his left lower extremity. He has developed new wounds again after being discharged just recently. Unfortunately this continues to be the case that he will heal and then have subsequent new wounds. The last time I was hopeful that he may not end up coming back too quickly especially since he states he has been using his lymphedema pumps along with wearing his compression. Nonetheless he had a blister on the back of his leg that popped up on the left and this has opened up into an ulceration it is quite painful. 01/22/19 on evaluation today patient actually appears  to be doing well with regard to his wound on the left lower extremity. He's been tolerating the dressing changes without complication including the compression wrap in the wound appears to be significantly smaller today which is great news. Overall very pleased in this regard. 01/29/2019 on evaluation today patient appears to be doing well with regard to his left posterior lower extremity ulcer. He has been tolerating the dressing changes without complication. This is not completely healed but is getting much closer. We did order a Farrow wrap 4000 for him he has received this and has it with him today although I am not sure we are quite ready to start him on that as of yet. We are very close. 02/05/2019 on evaluation today patient actually appears to be doing quite well with regard to his left posterior lower extremity ulcer. He still has a very tiny opening remaining but the fortunate thing is he seems to be healing quite nicely. He also did get his Farrow wrap which I am hoping will help with his edema control as well at home. Fortunately there is no evidence of active infection. 02/12/2019 patient and fortunately appears to be doing poorly in regard to his wounds of the left lower extremity. He was very close to healing therefore we attempted to use his Velcro compression wraps continuing with lymphedema pumps at home. Unfortunately that does not seem to have done very well for him. He tells me that he wore them all the time but again I am not sure why if that is the case that he is having such significant edema. He is still on his fluid pills as well. With that being said there is no obvious sign of infection although I do wonder about the possibility of infection at this time as well. 02/19/2019 unfortunately upon evaluation today patient appears to be doing more poorly with regard to his left lower extremity. He is not showing signs of significant improvement and I think the biggest issue here is  that he does have an infection that appears to likely be Pseudomonas. That is based on the blue-green drainage that were noted at this time. Unfortunately the antibiotic that has been on is not going to take care of this at all. I think they will get a need to switch him to either Levaquin or Cipro and this was discussed with the patient. 02/26/2019 on evaluation today patient's lower extremity on the left appears to be doing significantly better as compared to last evaluation. Fortunately there is no signs of active infection at this time. He has been tolerating the compression wrap without complication in  fact he made it the whole week at this point. He is showing signs of excellent improvement I am very happy in this regard. With that being said he is having some issues with infection we did review the results of his culture which I noted today. He did have a positive finding for Enterobacter as well as Alcaligenes faecalis. Fortunately the Levaquin that I placed him on will work for both which is great news. There is no signs of systemic infection at this point. 10/30; left posterior leg wound in the setting of very significant edema and what looks like chronic venous inflammation. He has compression pumps but does not use them. We have been using 3 layer compression. Silver alginate to the wound as the primary dressing 03/18/2019 on evaluation today patient appears to be doing a little better compared to last time I saw him. He really has not been using his compression pumps he tells me that he is having too much discomfort. He has been keeping his wraps on however. He is only been taking his fluid pills every other day because he states they are not really helping and he has an appointment with his primary care provider at the [facility] tomorrow. Subsequently the wound itself on the left lower extremity does seem to be greatly improved compared to previous. 03/25/2019 on evaluation today patient appears  to be doing better with regard to his wounds on the bilateral lower extremities. The left is doing excellent the right is also doing better although both still do show some signs of open wounds noted at this point unfortunately. Fortunately there is no signs of active infection at this time. The patient also is not really having any significant pain which is good news. Unfortunately there was some confusion with the referral on vascular disease and as far as getting the patient scheduled there can be contacting him later today to do this [name], [name] ([id number]) fortunately we got this straightened out. 04/01/2019 on evaluation today patient appears to be doing no fevers, chills, nausea, vomiting, or diarrhea. Excellent at this time with regard to his lower extremities. There does not appear to be any open wound at this point which is good news. Fortunately is also no signs of active infection at this time. Overall feel like the patient has done excellent with the compression the problem is every time we got him to this point and then subsequently go to using his own compression things just go right back to where they were. I am not sure how to address this we can try to get an appointment with vascular for 2 weeks now they have yet to call him. Obviously this has become frustrating for the patient as well. I think the issue has just been an honest error as far as scheduling is concerned but nonetheless still worn out the point where I am unsure of which direction we should take. 04/08/2019 on evaluation today patient actually appears to be doing well with regard to his lower extremities. There are no open wounds at this time and things seem to be managing quite nicely as far as the overall edema control is concerned. With that being said he does have his compression socks today for [place] to go ahead and reinitiate therapy in that manner at this point. He is going to be going for shoes to be  measured on Wednesday and then coincidentally he will also be seeing vascular on Thursday. Overall I think this is good news and  again I am hopeful that they will be able to do something for him to help prevent ongoing issues with edema control as well. No fevers, chills, nausea, vomiting, or diarrhea. 04/11/2019 on evaluation today patient actually appears to be doing poorly after just being discharged on Monday of this week. He had been experiencing issues with again blisters especially on the left lower extremity. With that being said he was completely healed and appeared to be doing great this past Monday. He then subsequently has new blisters that formed before his appointment with vascular this morning. He was also measured for shoes in the interim. With that being said we may have figured out what exactly is going on and why he continues to have issues like what we are seeing at this point. He takes his compression stockings off at nighttime and then he ends up having to sleep in his chair for 5-6 hours a night. He sleeps with his feet down he cannot really get him up in the recliner and therefore he is sleeping and the worst possible his position with his feet on the floor for that majority of the time. Again as I explained to him that is about one third at minimum at least one fourth of his day that he spending with his feet dangling down on the ground and the worst possible position they could be. I think this may be what is causing the issue. Subsequently I am leaning toward thinking that he may need a hospital bed in order to elevate his legs. We likely can have to coordinate this with his primary care provider at the [facility]. Readmission: 01/26/2021 this is a patient who presents for repeat evaluation here in the clinic although it is actually been couple of years since have seen him in fact it was December 2020 when I last saw him. Subsequently he never really healed but did end up  being lost to follow-up. He tells me has been having issues ongoing with his lower extremities has bilateral lower extremity lymphedema no real significant or definitive open wounds but in general his lymphedema is way out of control. We were never able to refer him to lymphedema clinic simply due to the fact to be honest we were never able to get him completely healed. I do not see anyone with open wounds. The patient does have evidence of type 2 diabetes mellitus, lymphedema, chronic venous insufficiency, and hypertension. That really has not changed since his last evaluation. 02/09/2021 upon evaluation today patient appears to be doing a little better in regard to his legs although he still having a tremendous amount of drainage especially on the left leg. Fortunately there does not appear to be any evidence of active infection. Of note when we looked into this further it appears that the patient did not have any absorptive dressing on it was just the 4-layer compression wrap. Nonetheless this is probably big part of the issue here. 10/10; he comes in today with 3 large areas on the upper right lower leg likely remanence of denuded blistering under his compression wraps. He has no other wounds on the right. On the left he has the denuded area on the left medial foot and ankle and on the left dorsal foot. Massive lymphedema in both feet dorsally. Using Zetuvit under compression We have increased home health visitation to twice a week to change the dressings and will change it once 02/22/2021 upon evaluation today patient appears to be doing well currently  with regard to his wounds. He has been tolerating the dressing changes without complication. Fortunately there does not appear to be any evidence of active infection which is great news. No fevers, chills, nausea, vomiting, or diarrhea. The biggest issue I see currently is that home health is not putting any medicine on the actual wounds  before wrapping. 03/01/2021 upon evaluation today the patient's right leg actually appears to be doing quite well which is great news there does not appear to be any evidence of active infection at this time. No fevers, chills, nausea, vomiting, or diarrhea. With that being said the patient is having issues on the left foot where he is having significant drainage is also an ammonia smell he does not have any animals at home and this makes me concerned about a bacteria producing urea as a byproduct. Again the possible common organisms will be E. coli, Proteus, and Enterococcus. All 3 of which can be successfully treated with Levaquin. For that reason I think that this may be a good option for [place] to consider placing him on and I did obtain a culture as well for confirmation sake. 03/08/2021 upon evaluation today patient appears to be doing unfortunately still somewhat poorly in regard to his leg ulcerations. He actually has an area on the right leg where he blistered due to the fact that his wrap slid down and caused an area of pinching on his skin and this has led to a significant issue here. 03/15/2021 upon evaluation today patient unfortunately has not been wrapped appropriately with absorptive dressings nor with the appropriate technique for the third layer of the 4-layer compression wrap. These are issues that we continue to try to address with the home health nurse. Also the absorptive dressing that she had was cut in half and therefore that causes things to leak out it does not actually trap the fluid in regard to the top of the foot overall I think that all these combined are really not seeing things improved significantly here. Fortunately there does not appear to be any signs of significant infection at this time which is good news. He still is having a tremendous amount of drainage. 03/22/2021 upon evaluation today patient appears to be draining tremendously. He still continues to tell me  that he is using his pumps 2 times a day and that coupled with that tells me that he is elevating his legs as well. With that being said all things considered I am really just not seeing the improvement we would expect to see with the 4-layer compression wrap and all the above noted. He in fact had an extremely large Zetuvit dressing on both legs and that they were extremely filled to the max with fluid. This is after just being changed just before the weekend and this is Monday. Nonetheless I am concerned about the fact that there is something going on fairly significant that we cannot get any of this under control and that he is draining this significantly. He supposed be having an echocardiogram it sounds like scheduling has been an issue for him as far as getting in sooner. Its something to do with needing his cousin to drive him because of where it sat and he cannot drive himself to this appointment either way I really think he needs to try to see what he can do about making this happen a little sooner. He tells me he will call today. [name], [name] ([id number]) 03/29/2021 on evaluation today patient appears to be  doing about the same in regard to his legs. He did get his cardiology appointment moved up to 6 December which is at least good that is better than what it was before mid December. Overall very pleased in that regard. 04/05/2021 upon evaluation today patient unfortunately is still doing fairly poorly. There does not appear to be any signs of active infection at this time. No fevers, chills, nausea, vomiting, or diarrhea. Unfortunately I think until his edema is under control and overall fluid overload there is really not to be much chance that I can do much to get him better. This is quite unfortunate and frustrating both for myself and the patient to be perfectly honest. Nonetheless I think that he really needs to have a conversation both with his primary care provider as well as  cardiologist he sees the PCP on Monday and cardiology on Wednesday of next week. 04/13/2021 upon evaluation today patient appears to be doing poorly in regard to his bilateral lower extremities his left is still worse than the right. With that being said he has a tremendous amount of drainage he did see his primary care provider yesterday there really was not much there to be done from their perspective. He sees cardiology tomorrow. Nonetheless my biggest concern here is simply that if we do not get the edema under control he is going to continue to have drainage and honestly I think at some point he is going to become infected severely that is my main concern. 04/19/2021 upon evaluation today patient appears to be doing poorly still in regard to his legs. Unfortunately there does not appear to be any signs of infection at this point. He does have a tremendous amount of drainage however. We have not seen the results back from the cardiologist and the echocardiogram that was done. It appears that the patient checked out okay as far as that is concerned with regard to ejection fraction though we still have some issues here to be honest with his diastolic function. I am unsure if this is accounting for everything that we are seeing or not. Either way he has a tremendous amount of drainage from his legs that we are just not able to control in the outpatient setting at this point. I have reached out to Dr. Rockey Situ his cardiologist to see once he reviews the sheet if there is anything that he feels like can be done from an outpatient perspective if not then I think the way to go is probably can to be through inpatient admission and diuresis. Otherwise I am not sure how working to get this under control we tried antibiotics, compression wrapping, and I have told the patient to be elevating his legs I am not sure how much he does of this but either way I think that this is still an ongoing issue  nonetheless. 04/26/2021 upon evaluation today patient appears to be doing poorly in regard to his legs. He is having a tremendous amount of fluid at this point which is quite unfortunate. Its to the point that he may have had at least 5 to 10 pounds of fluid in his dressings this morning when they were removed these were changed this Friday. Subsequently I think he needs to go to the ER for further evaluation and treatment I think is probably can need diuresis possibly even IV antibiotics been on what the blood work looks like but in general I feel like he needs something to get this under control from  an outpatient perspective absent of everywhere I can think of and I cannot get this under control with our traditional measures. I think this is going require more so that we can get him better 12/30; this is a patient with severe bilateral lymphedema. He was hospitalized from 04/26/2021 through 04/29/2021 treated for cellulitis in the setting of lower extremity ulcers and lymphedema. After he left the hospital he is apparently seen for nurse visit our staff contacted cardiology and he has been started on Lasix 40 mg. Apparently his legs have less edema. Lab work from 05/04/2021 showed a BUN of 38 and creatinine of 1.59 these are elevated versus previous where his creatinine seems to have been 1.30 on 12/19 his potassium is 4.3. I believe the lab work is being followed by cardiology We have him in a 4-layer wrap. Xeroform on the leg wounds and sit to fit on the Berry damage skin on the left dorsal foot versus right dorsal foot. He has compression pumps but does not use them. We have apparently not yet ordered him compression stockings 05/17/2021 upon evaluation today patient's legs though better than last time I personally saw him appear to be getting worse compared to where they were previous. Dr. Quentin Cornwall was actually last 1 to see you I have not seen him since 19 December. That was before he went into  the ER. Coming out apparently his legs looked also and they still look better but not as good as they were in the past. 1/16; patient with severe bilateral lymphedema. Severe scaled hyperkeratotic skin on the dorsal aspect of his distal left foot and left medial ankle.. On the right side changes are not as bad. He did not have any weeping edema. Our intake nurse was convinced that he is being compliant with compression pumps 1 hour twice a day 05/31/2021 upon evaluation today patient actually appears to be doing a little bit better in my opinion in regard to his feet. I do not see as much drainage and it being just completely wet as it was previous. Fortunately I do not also see any signs of active infection which is great news as well. 06/07/21 Upon inspection patient's wound bed actually showed signs of doing well he is not nearly as weepy and wet as he has been in the past and overall very pleased in that regard. Fortunately I do not see any signs of active infection locally or nor systemically at this time. Which is great news. No fevers, chills, nausea, vomiting, or diarrhea. 06/14/2021 upon evaluation today patient appears to be doing well with regard to his right foot I am pleased in that regard. His left foot is still draining quite a bit despite using lymphedema pumps, 4-layer compression wraps, and he tells me elevating his legs as well. He also has Lasix that he takes twice a day. Nonetheless I believe that this is still good to be an ongoing issue. We have a hard time getting this under control as far as the swelling is concerned. 06/21/2021 upon evaluation today patient appears to be doing decently well in regard to his wounds all things considered. He still has a tremendous amount of drainage and fluid noted at this point. Fortunately I do not see any signs of active infection locally or systemically at this point which is great news. Nonetheless I am unsure where to go and how to do this  as far as trying to limit his swelling and weeping from his toes in particular.  06/28/2021 upon evaluation patient unfortunately continues to have significant drainage from his feet. We have been keeping him in a compression wrap and despite this he still continues to have extreme fluid issues he seen his cardiologist he is seeing the nephrologist. We really cannot find any way to get this under control when he did well was when he was in the hospital and they got some of the fluid away. But outside of that we are just struggling to achieve the long-term goal of getting this under control and keeping it under control unfortunately. Electronic Signature(s) Signed: 06/28/2021 4:55:06 PM By: Worthy Keeler PA-C Entered By: Worthy Keeler on 06/28/2021 16:55:06 [name] ([id number]) -------------------------------------------------------------------------------- Physical Exam Details Patient Name: EULON, ALLNUTT Date of Service: 06/28/2021 1:45 PM Medical Record Number: [id number] Patient Account Number: [id number] Date of Birth/Sex: Aug 15, 1950 (71 y.o. M) Treating RN: Donnamarie Poag Primary Care Provider: Felipa Eth Other Clinician: Referring Provider: Felipa Eth Treating Provider/Extender: Skipper Cliche in Treatment: 21 Constitutional Well-nourished and well-hydrated in no acute distress. Respiratory normal breathing without difficulty. Psychiatric this patient is able to make decisions and demonstrates good insight into disease process. Alert and Oriented x 3. pleasant and cooperative. Notes Upon inspection patient's wounds again are mainly weeping from the feet there really is not true open ulcerations at this point. He just has significant lymphedema bilaterally with extreme swelling in his feet which is causing this to be an extremely difficult situation to manage. We have attempted to go every route I can think of to try to improve our chances here. In the end I think  our best bet is probably going to be for [place] to see about a referral to [facility] to Dr. Haynes Kerns to see if there is anything he could recommend or do to help this patient. Electronic Signature(s) Signed: 06/28/2021 4:55:51 PM By: Worthy Keeler PA-C Entered By: Worthy Keeler on 06/28/2021 16:55:50 [name] ([id number]) -------------------------------------------------------------------------------- Physician Orders Details Patient Name: [name] Date of Service: 06/28/2021 1:45 PM Medical Record Number: [id number] Patient Account Number: [id number] Date of Birth/Sex: 06-Mar-1951 (71 y.o. M) Treating RN: Donnamarie Poag Primary Care Provider: Felipa Eth Other Clinician: Referring Provider: Felipa Eth Treating Provider/Extender: Skipper Cliche in Treatment: 21 Verbal / Phone Orders: No Diagnosis Coding ICD-10 Coding Code Description E11.622 Type 2 diabetes mellitus with other skin ulcer I89.0 Lymphedema, not elsewhere classified I87.2 Venous insufficiency (chronic) (peripheral) L97.822 Non-pressure chronic ulcer of other part of left lower leg with fat layer exposed L97.512 Non-pressure chronic ulcer of other part of right foot with fat layer exposed I10 Essential (primary) hypertension L97.811 Non-pressure chronic ulcer of other part of right lower leg limited to breakdown of skin Follow-up Appointments o Return Appointment in 1 week. o Nurse Visit as needed [place]: - [facility]-see dressing changes notes on powder between toes--also dressing 4 x week total o [place] for wound care. May utilize formulary equivalent dressing for wound treatment orders unless otherwise specified. Home Health Nurse may visit PRN to address patientos wound care needs. - frequency 3 times per week - patient will be seen once at wound center - home health to see patient 2 times per week o Scheduled days for dressing changes to be completed;  exception, patient has scheduled wound care visit that day. o **Please direct any NON-WOUND related issues/requests for orders to patient's Primary Care Physician. **If current dressing causes regression in wound condition, may D/C ordered dressing product/s and  apply Normal Saline Moist Dressing daily until next [place] or Other MD appointment. **Notify Wound Healing Center of regression in wound condition at [phone]. Bathing/ Shower/ Hygiene o May shower with wound dressing protected with water repellent cover or cast protector. o No tub bath. Edema Control - Lymphedema / Segmental Compressive Device / Other Bilateral Lower Extremities o Optional: One layer of unna paste to top of compression wrap (to act as an anchor). - Unna paste on calf to secure wrap in place as needed o 4 Layer Compression System Lymphedema. - ****bilateral legs-total of 4 x per week bi lat, nystatin powder between toes on bilateral toes, 2x2 gauze between toes only needed on left side, apply zetuvits/ or formulary for extra absorbant dressing around ankles and foot area, use ABD to cover toes; 3 times per week with HH and 1 time at wound center [place], LIGHT TAN SPIRAL. WHITE WITH YELLOW LINE FIGURE 8 , COBAN SPIRAL o Elevate, Exercise Daily and Avoid Standing for Long Periods of Time. o Elevate legs to the level of the heart and pump ankles as often as possible o Elevate leg(s) parallel to the floor when sitting. o Compression Pump: Use compression pump on left lower extremity for 60 minutes, twice daily. - 2 times per day o DO YOUR BEST to sleep in the bed at night. DO NOT sleep in your recliner. Long hours of sitting in a recliner leads to swelling of the legs and/or potential wounds on your backside. o Other: - Contact prescriber regarding use of diuretics to reduce fluid overload. Off-Loading o Turn and reposition every 2 hours Additional Orders /  Instructions o Follow Nutritious Diet and Increase Protein Intake Consults o Other Physician Referral - [facility]-Dr. Sharyon Medicus evaluate bilateral lymphedema with increased drainage. See provider notes - (ICD10 I89.0 - Lymphedema, not elsewhere classified) [name], [name] ([id number]) Electronic Signature(s) Signed: 06/28/2021 4:23:33 PM By: Donnamarie Poag Signed: 06/28/2021 5:14:22 PM By: Worthy Keeler PA-C Entered By: Donnamarie Poag on 06/28/2021 14:34:40 [name] ([id number]) -------------------------------------------------------------------------------- Prescription 06/28/2021 Patient Name: [name] Provider: Jeri Cos PA-C Date of Birth: April 11, 1951 NPI#: [id number] Sex: M DEA#: [id number] Phone #: [phone] License #: Patient Address: [place], [place] [address] [address] [place], [place] [address] [phone] Allergies No Known Drug Allergies Provider's Orders o Other Physician Referral - ICD10: I89.0 - [facility]-Dr. Sharyon Medicus evaluate bilateral lymphedema with increased drainage. See provider notes Hand Signature: Date(s): Electronic Signature(s) Signed: 06/28/2021 4:23:33 PM By: Donnamarie Poag Signed: 06/28/2021 5:14:22 PM By: Worthy Keeler PA-C Entered By: Donnamarie Poag on 06/28/2021 14:34:40 [name] ([id number]) --------------------------------------------------------------------------------  Problem List Details Patient Name: [name] Date of Service: 06/28/2021 1:45 PM Medical Record Number: [id number] Patient Account Number: [id number] Date of Birth/Sex: 1951/02/07 (71 y.o. M) Treating RN: Donnamarie Poag Primary Care Provider: Felipa Eth Other Clinician: Referring Provider: Felipa Eth Treating Provider/Extender: Skipper Cliche in Treatment: 21 Active Problems ICD-10 Encounter Code Description Active  Date MDM Diagnosis E11.622 Type 2 diabetes mellitus with other skin ulcer 01/26/2021 No Yes I89.0 Lymphedema, not elsewhere classified 01/26/2021 No Yes I87.2 Venous insufficiency (chronic) (peripheral) 01/26/2021 No Yes L97.822 Non-pressure chronic ulcer of other part of left lower leg with fat layer 01/26/2021 No Yes exposed L97.512 Non-pressure chronic ulcer of other part of right foot with fat layer 01/26/2021 No Yes exposed [name] (primary) hypertension 01/26/2021 No Yes  L97.811 Non-pressure chronic ulcer of other part of right lower leg limited to 02/15/2021 No Yes breakdown of skin Inactive Problems Resolved Problems Electronic Signature(s) Signed: 06/28/2021 2:11:59 PM By: Worthy Keeler PA-C Entered By: Worthy Keeler on 06/28/2021 14:11:58 [name] ([id number]) -------------------------------------------------------------------------------- Progress Note Details Patient Name: [name] Date of Service: 06/28/2021 1:45 PM Medical Record Number: [id number] Patient Account Number: [id number] Date of Birth/Sex: 09-Jul-1950 (71 y.o. M) Treating RN: Donnamarie Poag Primary Care Provider: Felipa Eth Other Clinician: Referring Provider: Felipa Eth Treating Provider/Extender: Skipper Cliche in Treatment: 21 Subjective Chief Complaint Information obtained from Patient Left LE ulcers History of Present Illness (HPI) 71 year old male who presented to the ER with bilateral lower extremity blisters which had started last week. he has a past medical history of leukemia, diabetes mellitus, hypertension, edema of both lower extremities, his recurrent skin infections, peripheral vascular disease, coronary artery disease, congestive heart failure and peripheral neuropathy. in the ER he was given Rocephin and put on Silvadene cream. he was put on oral doxycycline and was asked to follow-up with the [facility]. His last hemoglobin A1c was 6.6 in December and he checks his  blood sugar once a week. He does not have any physicians outside the [place] system. He does not recall any vascular duplex studies done either for arterial or venous disease but was told to wear compression stockings which he does not use 05/30/2016 -- we have not yet received any of his notes from the [facility] hospital system and his arterial and venous duplex studies are scheduled here in [place] around mid February. We are unable to have his insurance accepted by home health agencies and hence he is getting dressings only once a week. 06/06/16 -- -- I received a call from the patient's PCP at the [facility] at [facility] and spoke to Dr. Garvin Fila, phone number [phone] and fax number [phone]. She confirmed that no vascular testing was done over the last 5 years and she would be happy to do them if the patient did want them to be done at the [place] and we could fax him a request. Readmission: 71 year old male seen by as in February of this year and was referred to vein and vascular for studies and opinion from the vascular surgeons. The patient returns today with a fresh problem having had blisters on his left lower extremity which have been there for about 5 days and he clearly states that he has been wearing his compression stockings as advised though he could not read the moderate compression and has been wearing light compression. Review of his electronic medical records note that he had lower extremity arterial duplex examination done on 06/23/2016 which showed no hemodynamically significant stenosis in the bilateral lower extremity arterial system. He also had a lower extremity venous reflux examination done on 07/07/2016 and it was noted that he had venous incompetence in the right great saphenous vein and bilateral common femoral veins. Patient was seen by Dr. Tamala Julian on the same day and for some reason his notes do not reflect the venous studies or the arterial studies and he recommended  patient do a venous duplex ultrasound to look for reflux and return to see him.he would also consider a lymph pump if required. The patient was told that his workup was normal and hence the patient canceled his follow-up appointment. 02/03/17 on evaluation today patient left medial lower extremity blister appears to be doing about the same. It is still continuing to drain and  there's still the blistered skin covering the wound bed which is making it difficult for the alternate to do its job. Fortunately there is no evidence of cellulitis. No fevers chills noted. Patient states in general he is not having any significant discomfort. Patient's lower extremity arterial duplex exam revealed that patient was hemodynamically stable with no evidence of stenosis in regard to the bilateral lower extremities. The lower extremity venous reflux exam revealed the patient had venous incontinence noted in the right greater saphenous and bilateral common femoral vein. There is no evidence of deep or superficial vein thrombosis in the bilateral lower extremities. Readmission: 11/12/18 Patient presents for evaluation our clinic today concerning issues that he is having with his left lower extremity. He tells me that a couple weeks ago he began developing blisters on the left lower extremity along with increased swelling. He typically wears his compression stockings on a regular basis is previously been evaluated both here as well is with vascular surgery they would recommend lymphedema pumps but unfortunately that somehow fell through and he never heard anything back from that. Nonetheless I think lymphedema pumps would be beneficial for this patient. He does have a history of hypertension and diabetes. Obviously the chronic venous stasis and lymphedema as well. At this point the blisters have been given in more trouble he states sometimes when the blisters openings able to clean it down with alcohol and it will dry  out and do well. Unfortunately that has not been the case this time. He is having some discomfort although this mean these with cleaning the areas he doesn't have discomfort just on a regular basis. He has not been able to wear his compression stockings since the blisters arose due to the fact that of course it will drain into the socks causing additional issues and he didn't have any way to wrap this otherwise. He has increased to taking his Lasix every day instead of every other day. He sees his primary care provider later this month as well. No fevers, chills, nausea, or vomiting noted at this time. 11/19/18-Patient returns at 1 week, per intake RN the amount of seepage into the compression wraps was definitely improved, overall all the wounds are measuring smaller but continuing silver alginate to the wounds as primary dressing 11/26/18 on evaluation today patient appears to be doing quite well in regard to his left lower [place] ulcers. In fact of the areas that were noted initially he only has two regions still open. There is no evidence of active infection at this time. He still is not heard anything from the company regarding lymphedema pumps as of yet. Again as previously seen vascular they have not recommended any surgical intervention. [name], [name] ([id number]) 12/03/2018 on evaluation today patient actually appears to be doing quite well with regard to his lower extremity ulcers. In fact most of the areas appear to be healed the one spot which does not seem to be completely healed I am unsure of whether or not this is really draining that much but nonetheless there does not appear to be any signs of infection or significant drainage at this point. There is no sign of fever, chills, nausea, vomiting, or diarrhea. Overall I am pleased with how things have progressed I think is very close to being able to transition to his home compression stockings. 12/10/2018 upon evaluation today patient  appears to be doing quite well with regard to his left lower extremity. He has been tolerating the dressing changes without  complication. Fortunately there is no signs of active infection at this time. He appears after thorough evaluation of his leg to only have 1 small area that remains open at this point everything else appears to be almost completely closed. He still have significant swelling of the left lower extremity. We had discussed discussing this with his primary care provider he is not able to see her in person they were at the [facility] and right now the [place] is not seeing patients on site. According to the patient anyway. Subsequently he did speak with her apparently and his primary care provider feels that he may likely have a DVT. With that being said she has not seen his leg she is just going off of his history. Nonetheless that is a concern that the patient now has as well and while I do not feel the DVT is likely we can definitely ensure that that is not the case I will go ahead and see about putting that order in today. Nonetheless otherwise I am in a recommend that we continue with the current wound care measures including the compression therapy most likely. We just need to ensure that his leg is indeed free of any DVTs. 12/17/2018 on evaluation today patient actually appears to be completely healed today. He does have 2 very small areas of blistering although this is not anything too significant at this point which is good news. With that being said I am in agreement with the fact that I think he is completely healed at this point. He does want to get back into his compression stocking. The good news is we have gotten approval from insurance for his lymphedema pumps we received a letter since last saw him last week. The other good news is his study did come back and showed no evidence of a DVT. 12/20/2018 on evaluation today patient presents for follow-up concerning his ongoing  issues with his left lower extremity. He was actually discharged last Friday and did fairly well until he states blisters opened this morning. He tells me he has been wearing his compression stocking although he has a hard time getting this on. There does not appear to be any signs of active infection at this time. No fevers, chills, nausea, vomiting, or diarrhea. 12/27/2018 on evaluation today patient appears to be doing very well with regard to his swelling of the left lower extremity the 4 layer compression wrap seems to have been beneficial for him. Fortunately there is no signs of active infection at this time. Patient has been tolerating the compression wrap without complication and his foot swelling in particular appears to be greatly improved. He does still have a wound on the lateral portion of his left leg I believe this is more of a blister that has now reopened. 01/03/2019 on evaluation today patient actually appears to be doing excellent in regard to his left lower extremity. He did receive his compression pumps and is actually use this 7 times since he was last here in the office. On top of the compression wrap he is now roughly 3 cm better at the calf and 2 cm better at the ankle he also states that his foot seem to go an issue better without even having to use a shoe horn. Obviously I think this is all evidence that he is doing excellent in this regard. The other good news is he does not appear to have anything open today as far as wounds are concerned. 01/15/2019 on  evaluation today patient appears to be doing more poorly yet again with regard to his left lower extremity. He has developed new wounds again after being discharged just recently. Unfortunately this continues to be the case that he will heal and then have subsequent new wounds. The last time I was hopeful that he may not end up coming back too quickly especially since he states he has been using his lymphedema pumps along  with wearing his compression. Nonetheless he had a blister on the back of his leg that popped up on the left and this has opened up into an ulceration it is quite painful. 01/22/19 on evaluation today patient actually appears to be doing well with regard to his wound on the left lower extremity. He's been tolerating the dressing changes without complication including the compression wrap in the wound appears to be significantly smaller today which is great news. Overall very pleased in this regard. 01/29/2019 on evaluation today patient appears to be doing well with regard to his left posterior lower extremity ulcer. He has been tolerating the dressing changes without complication. This is not completely healed but is getting much closer. We did order a Farrow wrap 4000 for him he has received this and has it with him today although I am not sure we are quite ready to start him on that as of yet. We are very close. 02/05/2019 on evaluation today patient actually appears to be doing quite well with regard to his left posterior lower extremity ulcer. He still has a very tiny opening remaining but the fortunate thing is he seems to be healing quite nicely. He also did get his Farrow wrap which I am hoping will help with his edema control as well at home. Fortunately there is no evidence of active infection. 02/12/2019 patient and fortunately appears to be doing poorly in regard to his wounds of the left lower extremity. He was very close to healing therefore we attempted to use his Velcro compression wraps continuing with lymphedema pumps at home. Unfortunately that does not seem to have done very well for him. He tells me that he wore them all the time but again I am not sure why if that is the case that he is having such significant edema. He is still on his fluid pills as well. With that being said there is no obvious sign of infection although I do wonder about the possibility of infection at this time  as well. 02/19/2019 unfortunately upon evaluation today patient appears to be doing more poorly with regard to his left lower extremity. He is not showing signs of significant improvement and I think the biggest issue here is that he does have an infection that appears to likely be Pseudomonas. That is based on the blue-green drainage that were noted at this time. Unfortunately the antibiotic that has been on is not going to take care of this at all. I think they will get a need to switch him to either Levaquin or Cipro and this was discussed with the patient. 02/26/2019 on evaluation today patient's lower extremity on the left appears to be doing significantly better as compared to last evaluation. Fortunately there is no signs of active infection at this time. He has been tolerating the compression wrap without complication in fact he made it the whole week at this point. He is showing signs of excellent improvement I am very happy in this regard. With that being said he is having some issues with infection  we did review the results of his culture which I noted today. He did have a positive finding for Enterobacter as well as Alcaligenes faecalis. Fortunately the Levaquin that I placed him on will work for both which is great news. There is no signs of systemic infection at this point. 10/30; left posterior leg wound in the setting of very significant edema and what looks like chronic venous inflammation. He has compression pumps but does not use them. We have been using 3 layer compression. Silver alginate to the wound as the primary dressing 03/18/2019 on evaluation today patient appears to be doing a little better compared to last time I saw him. He really has not been using his compression pumps he tells me that he is having too much discomfort. He has been keeping his wraps on however. He is only been taking his fluid pills every other day because he states they are not really helping and he has  an appointment with his primary care provider at the [facility] tomorrow. Subsequently the wound itself on the left lower extremity does seem to be greatly improved compared to previous. [name], [name] ([id number]) 03/25/2019 on evaluation today patient appears to be doing better with regard to his wounds on the bilateral lower extremities. The left is doing excellent the right is also doing better although both still do show some signs of open wounds noted at this point unfortunately. Fortunately there is no signs of active infection at this time. The patient also is not really having any significant pain which is good news. Unfortunately there was some confusion with the referral on vascular disease and as far as getting the patient scheduled there can be contacting him later today to do this fortunately we got this straightened out. 04/01/2019 on evaluation today patient appears to be doing no fevers, chills, nausea, vomiting, or diarrhea. Excellent at this time with regard to his lower extremities. There does not appear to be any open wound at this point which is good news. Fortunately is also no signs of active infection at this time. Overall feel like the patient has done excellent with the compression the problem is every time we got him to this point and then subsequently go to using his own compression things just go right back to where they were. I am not sure how to address this we can try to get an appointment with vascular for 2 weeks now they have yet to call him. Obviously this has become frustrating for the patient as well. I think the issue has just been an honest error as far as scheduling is concerned but nonetheless still worn out the point where I am unsure of which direction we should take. 04/08/2019 on evaluation today patient actually appears to be doing well with regard to his lower extremities. There are no open wounds at this time and things seem to be managing quite nicely as far  as the overall edema control is concerned. With that being said he does have his compression socks today for [place] to go ahead and reinitiate therapy in that manner at this point. He is going to be going for shoes to be measured on Wednesday and then coincidentally he will also be seeing vascular on Thursday. Overall I think this is good news and again I am hopeful that they will be able to do something for him to help prevent ongoing issues with edema control as well. No fevers, chills, nausea, vomiting, or diarrhea. 04/11/2019 on evaluation today  patient actually appears to be doing poorly after just being discharged on Monday of this week. He had been experiencing issues with again blisters especially on the left lower extremity. With that being said he was completely healed and appeared to be doing great this past Monday. He then subsequently has new blisters that formed before his appointment with vascular this morning. He was also measured for shoes in the interim. With that being said we may have figured out what exactly is going on and why he continues to have issues like what we are seeing at this point. He takes his compression stockings off at nighttime and then he ends up having to sleep in his chair for 5-6 hours a night. He sleeps with his feet down he cannot really get him up in the recliner and therefore he is sleeping and the worst possible his position with his feet on the floor for that majority of the time. Again as I explained to him that is about one third at minimum at least one fourth of his day that he spending with his feet dangling down on the ground and the worst possible position they could be. I think this may be what is causing the issue. Subsequently I am leaning toward thinking that he may need a hospital bed in order to elevate his legs. We likely can have to coordinate this with his primary care provider at the [facility]. Readmission: 01/26/2021 this is a patient who  presents for repeat evaluation here in the clinic although it is actually been couple of years since have seen him in fact it was December 2020 when I last saw him. Subsequently he never really healed but did end up being lost to follow-up. He tells me has been having issues ongoing with his lower extremities has bilateral lower extremity lymphedema no real significant or definitive open wounds but in general his lymphedema is way out of control. We were never able to refer him to lymphedema clinic simply due to the fact to be honest we were never able to get him completely healed. I do not see anyone with open wounds. The patient does have evidence of type 2 diabetes mellitus, lymphedema, chronic venous insufficiency, and hypertension. That really has not changed since his last evaluation. 02/09/2021 upon evaluation today patient appears to be doing a little better in regard to his legs although he still having a tremendous amount of drainage especially on the left leg. Fortunately there does not appear to be any evidence of active infection. Of note when we looked into this further it appears that the patient did not have any absorptive dressing on it was just the 4-layer compression wrap. Nonetheless this is probably big part of the issue here. 10/10; he comes in today with 3 large areas on the upper right lower leg likely remanence of denuded blistering under his compression wraps. He has no other wounds on the right. On the left he has the denuded area on the left medial foot and ankle and on the left dorsal foot. Massive lymphedema in both feet dorsally. Using Zetuvit under compression We have increased home health visitation to twice a week to change the dressings and will change it once 02/22/2021 upon evaluation today patient appears to be doing well currently with regard to his wounds. He has been tolerating the dressing changes without complication. Fortunately there does not appear to be  any evidence of active infection which is great news. No fevers, chills, nausea,  vomiting, or diarrhea. The biggest issue I see currently is that home health is not putting any medicine on the actual wounds before wrapping. 03/01/2021 upon evaluation today the patient's right leg actually appears to be doing quite well which is great news there does not appear to be any evidence of active infection at this time. No fevers, chills, nausea, vomiting, or diarrhea. With that being said the patient is having issues on the left foot where he is having significant drainage is also an ammonia smell he does not have any animals at home and this makes me concerned about a bacteria producing urea as a byproduct. Again the possible common organisms will be E. coli, Proteus, and Enterococcus. All 3 of which can be successfully treated with Levaquin. For that reason I think that this may be a good option for [place] to consider placing him on and I did obtain a culture as well for confirmation sake. 03/08/2021 upon evaluation today patient appears to be doing unfortunately still somewhat poorly in regard to his leg ulcerations. He actually has an area on the right leg where he blistered due to the fact that his wrap slid down and caused an area of pinching on his skin and this has led to a significant issue here. 03/15/2021 upon evaluation today patient unfortunately has not been wrapped appropriately with absorptive dressings nor with the appropriate technique for the third layer of the 4-layer compression wrap. These are issues that we continue to try to address with the home health nurse. Also the absorptive dressing that she had was cut in half and therefore that causes things to leak out it does not actually trap the fluid in regard to the top of the foot overall I think that all these combined are really not seeing things improved significantly here. Fortunately there does not appear to be any signs of significant  infection at this time which is good news. He still is having a tremendous amount of drainage. 03/22/2021 upon evaluation today patient appears to be draining tremendously. He still continues to tell me that he is using his pumps 2 times a day and that coupled with that tells me that he is elevating his legs as well. With that being said all things considered I am really just not seeing the improvement we would expect to see with the 4-layer compression wrap and all the above noted. He in fact had an extremely large Zetuvit dressing on both legs and that they were extremely filled to the max with fluid. This is after just being changed just before the weekend and this [name], [name] ([id number]) is Monday. Nonetheless I am concerned about the fact that there is something going on fairly significant that we cannot get any of this under control and that he is draining this significantly. He supposed be having an echocardiogram it sounds like scheduling has been an issue for him as far as getting in sooner. Its something to do with needing his cousin to drive him because of where it sat and he cannot drive himself to this appointment either way I really think he needs to try to see what he can do about making this happen a little sooner. He tells me he will call today. 03/29/2021 on evaluation today patient appears to be doing about the same in regard to his legs. He did get his cardiology appointment moved up to 6 December which is at least good that is better than what it was before mid December.  Overall very pleased in that regard. 04/05/2021 upon evaluation today patient unfortunately is still doing fairly poorly. There does not appear to be any signs of active infection at this time. No fevers, chills, nausea, vomiting, or diarrhea. Unfortunately I think until his edema is under control and overall fluid overload there is really not to be much chance that I can do much to get him better. This is quite  unfortunate and frustrating both for myself and the patient to be perfectly honest. Nonetheless I think that he really needs to have a conversation both with his primary care provider as well as cardiologist he sees the PCP on Monday and cardiology on Wednesday of next week. 04/13/2021 upon evaluation today patient appears to be doing poorly in regard to his bilateral lower extremities his left is still worse than the right. With that being said he has a tremendous amount of drainage he did see his primary care provider yesterday there really was not much there to be done from their perspective. He sees cardiology tomorrow. Nonetheless my biggest concern here is simply that if we do not get the edema under control he is going to continue to have drainage and honestly I think at some point he is going to become infected severely that is my main concern. 04/19/2021 upon evaluation today patient appears to be doing poorly still in regard to his legs. Unfortunately there does not appear to be any signs of infection at this point. He does have a tremendous amount of drainage however. We have not seen the results back from the cardiologist and the echocardiogram that was done. It appears that the patient checked out okay as far as that is concerned with regard to ejection fraction though we still have some issues here to be honest with his diastolic function. I am unsure if this is accounting for everything that we are seeing or not. Either way he has a tremendous amount of drainage from his legs that we are just not able to control in the outpatient setting at this point. I have reached out to Dr. Rockey Situ his cardiologist to see once he reviews the sheet if there is anything that he feels like can be done from an outpatient perspective if not then I think the way to go is probably can to be through inpatient admission and diuresis. Otherwise I am not sure how working to get this under control we tried  antibiotics, compression wrapping, and I have told the patient to be elevating his legs I am not sure how much he does of this but either way I think that this is still an ongoing issue nonetheless. 04/26/2021 upon evaluation today patient appears to be doing poorly in regard to his legs. He is having a tremendous amount of fluid at this point which is quite unfortunate. Its to the point that he may have had at least 5 to 10 pounds of fluid in his dressings this morning when they were removed these were changed this Friday. Subsequently I think he needs to go to the ER for further evaluation and treatment I think is probably can need diuresis possibly even IV antibiotics been on what the blood work looks like but in general I feel like he needs something to get this under control from an outpatient perspective absent of everywhere I can think of and I cannot get this under control with our traditional measures. I think this is going require more so that we can get him better  12/30; this is a patient with severe bilateral lymphedema. He was hospitalized from 04/26/2021 through 04/29/2021 treated for cellulitis in the setting of lower extremity ulcers and lymphedema. After he left the hospital he is apparently seen for nurse visit our staff contacted cardiology and he has been started on Lasix 40 mg. Apparently his legs have less edema. Lab work from 05/04/2021 showed a BUN of 38 and creatinine of 1.59 these are elevated versus previous where his creatinine seems to have been 1.30 on 12/19 his potassium is 4.3. I believe the lab work is being followed by cardiology We have him in a 4-layer wrap. Xeroform on the leg wounds and sit to fit on the Berry damage skin on the left dorsal foot versus right dorsal foot. He has compression pumps but does not use them. We have apparently not yet ordered him compression stockings 05/17/2021 upon evaluation today patient's legs though better than last time I personally  saw him appear to be getting worse compared to where they were previous. Dr. Quentin Cornwall was actually last 1 to see you I have not seen him since 19 December. That was before he went into the ER. Coming out apparently his legs looked also and they still look better but not as good as they were in the past. 1/16; patient with severe bilateral lymphedema. Severe scaled hyperkeratotic skin on the dorsal aspect of his distal left foot and left medial ankle.. On the right side changes are not as bad. He did not have any weeping edema. Our intake nurse was convinced that he is being compliant with compression pumps 1 hour twice a day 05/31/2021 upon evaluation today patient actually appears to be doing a little bit better in my opinion in regard to his feet. I do not see as much drainage and it being just completely wet as it was previous. Fortunately I do not also see any signs of active infection which is great news as well. 06/07/21 Upon inspection patient's wound bed actually showed signs of doing well he is not nearly as weepy and wet as he has been in the past and overall very pleased in that regard. Fortunately I do not see any signs of active infection locally or nor systemically at this time. Which is great news. No fevers, chills, nausea, vomiting, or diarrhea. 06/14/2021 upon evaluation today patient appears to be doing well with regard to his right foot I am pleased in that regard. His left foot is still draining quite a bit despite using lymphedema pumps, 4-layer compression wraps, and he tells me elevating his legs as well. He also has Lasix that he takes twice a day. Nonetheless I believe that this is still good to be an ongoing issue. We have a hard time getting this under control as far as the swelling is concerned. 06/21/2021 upon evaluation today patient appears to be doing decently well in regard to his wounds all things considered. He still has a tremendous amount of drainage and fluid noted  at this point. Fortunately I do not see any signs of active infection locally or systemically at this point which is great news. Nonetheless I am unsure where to go and how to do this as far as trying to limit his swelling and weeping from his toes in particular. 06/28/2021 upon evaluation patient unfortunately continues to have significant drainage from his feet. We have been keeping him in a compression wrap and despite this he still continues to have extreme fluid issues he seen  his cardiologist he is seeing the nephrologist. We really cannot find any way to get this under control when he did well was when he was in the hospital and they got some of the fluid away. But outside of that we are just struggling to achieve the long-term goal of getting this under control and keeping it under control unfortunately. Nedrow, [name] ([id number]) Objective Constitutional Well-nourished and well-hydrated in no acute distress. Vitals Time Taken: 2:08 PM, Height: 73 in, Weight: 312 lbs, BMI: 41.2, Temperature: 98.2 F, Pulse: 66 bpm, Respiratory Rate: 16 breaths/min, Blood Pressure: 106/65 mmHg. Respiratory normal breathing without difficulty. Psychiatric this patient is able to make decisions and demonstrates good insight into disease process. Alert and Oriented x 3. pleasant and cooperative. General Notes: Upon inspection patient's wounds again are mainly weeping from the feet there really is not true open ulcerations at this point. He just has significant lymphedema bilaterally with extreme swelling in his feet which is causing this to be an extremely difficult situation to manage. We have attempted to go every route I can think of to try to improve our chances here. In the end I think our best bet is probably going to be for [place] to see about a referral to [facility] to Dr. Haynes Kerns to see if there is anything he could recommend or do to help this patient. Other Condition(s) Patient presents with Lymphedema  located on the Right Leg. Patient presents with Lymphedema located on the Left Leg. General Notes: increased foul smelling drainage noted-serous Assessment Active Problems ICD-10 Type 2 diabetes mellitus with other skin ulcer Lymphedema, not elsewhere classified Venous insufficiency (chronic) (peripheral) Non-pressure chronic ulcer of other part of left lower leg with fat layer exposed Non-pressure chronic ulcer of other part of right foot with fat layer exposed Essential (primary) hypertension Non-pressure chronic ulcer of other part of right lower leg limited to breakdown of skin Procedures There was a Four Layer Compression Therapy Procedure by Donnamarie Poag, RN. Post procedure Diagnosis Wound #: Same as Pre-Procedure There was a Four Layer Compression Therapy Procedure by Donnamarie Poag, RN. Post procedure Diagnosis Wound #: Same as Pre-Procedure Plan Follow-up Appointments: Return Appointment in 1 week. Nurse Visit as needed Home Health: [name], [name] ([id number]) [place]: - [facility]-see dressing changes notes on powder between toes--also dressing 4 x week total [place] for wound care. May utilize formulary equivalent dressing for wound treatment orders unless otherwise specified. Home Health Nurse may visit PRN to address patient s wound care needs. - frequency 3 times per week - patient will be seen once at wound center - home health to see patient 2 times per week Scheduled days for dressing changes to be completed; exception, patient has scheduled wound care visit that day. **Please direct any NON-WOUND related issues/requests for orders to patient's Primary Care Physician. **If current dressing causes regression in wound condition, may D/C ordered dressing product/s and apply Normal Saline Moist Dressing daily until next [place] or Other MD appointment. **Notify Wound Healing Center of regression in wound condition at [phone]. Bathing/  Shower/ Hygiene: May shower with wound dressing protected with water repellent cover or cast protector. No tub bath. Edema Control - Lymphedema / Segmental Compressive Device / Other: Optional: One layer of unna paste to top of compression wrap (to act as an anchor). - Unna paste on calf to secure wrap in place as needed 4 Layer Compression System Lymphedema. - ****bilateral legs-total of 4 x per week bi lat,  nystatin powder between toes on bilateral toes, 2x2 gauze between toes only needed on left side, apply zetuvits/ or formulary for extra absorbant dressing around ankles and foot area, use ABD to cover toes; 3 times per week with HH and 1 time at wound center [place], LIGHT TAN SPIRAL. WHITE WITH YELLOW LINE FIGURE 8 , COBAN SPIRAL Elevate, Exercise Daily and Avoid Standing for Long Periods of Time. Elevate legs to the level of the heart and pump ankles as often as possible Elevate leg(s) parallel to the floor when sitting. Compression Pump: Use compression pump on left lower extremity for 60 minutes, twice daily. - 2 times per day DO YOUR BEST to sleep in the bed at night. DO NOT sleep in your recliner. Long hours of sitting in a recliner leads to swelling of the legs and/or potential wounds on your backside. Other: - Contact prescriber regarding use of diuretics to reduce fluid overload. Off-Loading: Turn and reposition every 2 hours Additional Orders / Instructions: Follow Nutritious Diet and Increase Protein Intake Consults ordered were: Other Physician Referral - [facility]-Dr. Sharyon Medicus evaluate bilateral lymphedema with increased drainage. See provider notes 1. Would recommend currently that we go ahead and continue with wound care measures as before and the patient is in agreement with plan. This includes the use of the nystatin powder and dry gauze. We have attempted alginate without success. We have also attempted drawtex without success. Again I am  really not certain what else to do to try to keep this under control he is getting this changed by home health as well 3 times per week in the end I really think this is a fluid overload issue. 2. I am also can recommend that we have the patient go and continue with the compression wrapping. Again we have been using a 4-layer compression wrap which is doing a good job but again it does not help with the distal portion of his feet. 3. I am also going to suggest that we have the patient continue to monitor for any signs of infection right now I do not see anything that appears to be infected but I am hopeful that Dr. Haynes Kerns may have some recommendation for what to do from a venous/weeping standpoint to try to get this under better control. We will see patient back for reevaluation in 1 week here in the clinic. If anything worsens or changes patient will contact our office for additional recommendations. Electronic Signature(s) Signed: 06/28/2021 4:57:08 PM By: Worthy Keeler PA-C Entered By: Worthy Keeler on 06/28/2021 16:57:08 [name] ([id number]) -------------------------------------------------------------------------------- SuperBill Details Patient Name: [name] Date of Service: 06/28/2021 Medical Record Number: [id number] Patient Account Number: [id number] Date of Birth/Sex: 19-Feb-1951 (71 y.o. M) Treating RN: Donnamarie Poag Primary Care Provider: Felipa Eth Other Clinician: Referring Provider: Felipa Eth Treating Provider/Extender: Skipper Cliche in Treatment: 21 Diagnosis Coding ICD-10 Codes Code Description E11.622 Type 2 diabetes mellitus with other skin ulcer I89.0 Lymphedema, not elsewhere classified I87.2 Venous insufficiency (chronic) (peripheral) L97.822 Non-pressure chronic ulcer of other part of left lower leg with fat layer exposed L97.512 Non-pressure chronic ulcer of other part of right foot with fat layer exposed I10 Essential (primary)  hypertension L97.811 Non-pressure chronic ulcer of other part of right lower leg limited to breakdown of skin Facility Procedures CPT4: Description Modifier Quantity Code [id number] BILATERAL: Application of multi-layer venous compression system; leg (below knee), including 1 ankle and foot. Physician Procedures CPT4 Code: [id number] Description: [phone] -  WC PHYS LEVEL 4 - EST PT Modifier: Quantity: 1 CPT4 Code: Description: ICD-10 Diagnosis Description E11.622 Type 2 diabetes mellitus with other skin ulcer I89.0 Lymphedema, not elsewhere classified L97.822 Non-pressure chronic ulcer of other part of left lower leg with fat layer I87.2 Venous insufficiency  (chronic) (peripheral) Modifier: exposed Quantity: Electronic Signature(s) Signed: 06/28/2021 4:57:42 PM By: Worthy Keeler PA-C Previous Signature: 06/28/2021 4:23:33 PM Version By: Donnamarie Poag Entered By: Worthy Keeler on 06/28/2021 16:57:42

## 2021-06-28 NOTE — Progress Notes (Signed)
HILTON, SAEPHAN ([id number]) Visit Report for 06/28/2021 Arrival Information Details Patient Name: [name], [name] Date of Service: 06/28/2021 1:45 PM Medical Record Number: [id number] Patient Account Number: [id number] Date of Birth/Sex: [birth year] (71 y.o. M) Treating RN: Donnamarie Poag Primary Care Rylynne Schicker: Felipa Eth Other Clinician: Referring Peightyn Roberson: Felipa Eth Treating Valisha Heslin/Extender: Skipper Cliche in Treatment: 21 Visit Information History Since Last Visit Added or deleted any medications: No Patient Arrived: [name] Had a fall or experienced change in No Arrival Time: 14:06 activities of daily living that may affect Accompanied By: self risk of falls: Transfer Assistance: None Hospitalized since last visit: No Patient Identification Verified: Yes Has Dressing in Place as Prescribed: Yes Secondary Verification Process Completed: Yes Has Compression in Place as Prescribed: Yes Patient Requires Transmission-Based No Pain Present Now: No Precautions: Patient Has Alerts: Yes Patient Alerts: AV[facility] consult on file Last ABI-R 1.09; L 1.04 Electronic Signature(s) Signed: 06/28/2021 4:23:33 PM By: Donnamarie Poag Entered By: Donnamarie Poag on 06/28/2021 14:07:32 [name] ([id number]) -------------------------------------------------------------------------------- Compression Therapy Details Patient Name: [name] Date of Service: 06/28/2021 1:45 PM Medical Record Number: [id number] Patient Account Number: [id number] Date of Birth/Sex: [birth year] (72 y.o. M) Treating RN: Donnamarie Poag Primary Care Llana Deshazo: Felipa Eth Other Clinician: Referring Tosh Glaze: Felipa Eth Treating Petina Muraski/Extender: Skipper Cliche in Treatment: 21 Compression Therapy Performed for Wound Assessment: NonWound Condition Lymphedema - Left Leg Performed By: Junius Argyle, RN Compression Type: Four Layer Post Procedure Diagnosis Same as Pre-procedure Electronic  Signature(s) Signed: 06/28/2021 4:23:33 PM By: Donnamarie Poag Entered By: Donnamarie Poag on 06/28/2021 [place], [place] ([id number]) -------------------------------------------------------------------------------- Compression Therapy Details Patient Name: [name] Date of Service: 06/28/2021 1:45 PM Medical Record Number: [id number] Patient Account Number: [id number] Date of Birth/Sex: 07-09-1950 (71 y.o. M) Treating RN: Donnamarie Poag Primary Care Wylodean Shimmel: Felipa Eth Other Clinician: Referring Erroll Wilbourne: Felipa Eth Treating Torell Minder/Extender: Skipper Cliche in Treatment: 21 Compression Therapy Performed for Wound Assessment: NonWound Condition Lymphedema - Right Leg Performed By: Junius Argyle, RN Compression Type: Four Layer Post Procedure Diagnosis Same as Pre-procedure Electronic Signature(s) Signed: 06/28/2021 4:23:33 PM By: Donnamarie Poag Entered By: Donnamarie Poag on 06/28/2021 14:31:47 [name] ([id number]) -------------------------------------------------------------------------------- Encounter Discharge Information Details Patient Name: [name] Date of Service: 06/28/2021 1:45 PM Medical Record Number: [id number] Patient Account Number: [id number] Date of Birth/Sex: [birth year] (71 y.o. M) Treating RN: Donnamarie Poag Primary Care Kemar Pandit: Felipa Eth Other Clinician: Referring Fraida Veldman: Felipa Eth Treating Adger Cantera/Extender: Skipper Cliche in Treatment: 21 Encounter Discharge Information Items Discharge Condition: Stable Ambulatory Status: Cane Discharge Destination: Home Transportation: Private Auto Accompanied By: self Schedule Follow-up Appointment: Yes Clinical Summary of Care: Electronic Signature(s) Signed: 06/28/2021 4:23:33 PM By: Donnamarie Poag Entered By: Donnamarie Poag on 06/28/2021 15:01:09 [name] ([id number]) -------------------------------------------------------------------------------- Lower Extremity Assessment  Details Patient Name: [name] Date of Service: 06/28/2021 1:45 PM Medical Record Number: [id number] Patient Account Number: [id number] Date of Birth/Sex: 12-15-1950 (71 y.o. M) Treating RN: Donnamarie Poag Primary Care Bera Pinela: Felipa Eth Other Clinician: Referring Mykelle Cockerell: Felipa Eth Treating Hades Mathew/Extender: Jeri Cos Weeks in Treatment: 21 Edema Assessment Assessed: [Left: Yes] Patrice [place]: Yes] Edema: [Left: Yes] [Right: Yes] Calf Left: Right: Point of Measurement: 35 cm From Medial Instep 40 cm 39 cm Ankle Left: Right: Point of Measurement: 10 cm From Medial Instep 35 cm 32 cm Electronic Signature(s) Signed: 06/28/2021 4:23:33 PM By: Donnamarie Poag Entered By: Donnamarie Poag on 06/28/2021 14:17:06 [name] ([id number]) -------------------------------------------------------------------------------- Multi Wound Chart Details Patient Name: [name] Date of Service: 06/28/2021 1:45 PM Medical  Record Number: [id number] Patient Account Number: [id number] Date of Birth/Sex: 1950/11/06 (71 y.o. M) Treating RN: Donnamarie Poag Primary Care Taylah Dubiel: Felipa Eth Other Clinician: Referring Lizmarie Witters: Felipa Eth Treating Quinnie Barcelo/Extender: Skipper Cliche in Treatment: 21 Vital Signs Height(in): 73 Pulse(bpm): 66 Weight(lbs): 312 Blood Pressure(mmHg): 106/65 Body Mass Index(BMI): 41.2 Temperature(F): 98.2 Respiratory Rate(breaths/min): 16 Wound Assessments Treatment Notes Electronic Signature(s) Signed: 06/28/2021 4:23:33 PM By: Donnamarie Poag Entered By: Donnamarie Poag on 06/28/2021 [place], [place] ([id number]) -------------------------------------------------------------------------------- [place] Details Patient Name: [name] Date of Service: 06/28/2021 1:45 PM Medical Record Number: [id number] Patient Account Number: [id number] Date of Birth/Sex: 07-28-1950 (71 y.o. M) Treating RN: Donnamarie Poag Primary Care Analy [place]:  Felipa Eth Other Clinician: Referring Karly Pitter: Felipa Eth Treating Olivia Royse/Extender: Skipper Cliche in Treatment: 21 Active Inactive Electronic Signature(s) Signed: 06/28/2021 4:23:33 PM By: Donnamarie Poag Entered By: Donnamarie Poag on 06/28/2021 14:30:51 [name] ([id number]) -------------------------------------------------------------------------------- Non-Wound Condition Assessment Details Patient Name: [name] Date of Service: 06/28/2021 1:45 PM Medical Record Number: [id number] Patient Account Number: [id number] Date of Birth/Sex: 08-27-1950 (71 y.o. M) Treating RN: Donnamarie Poag Primary Care Rayyan Burley: Felipa Eth Other Clinician: Referring Sigmund Morera: Felipa Eth Treating Krishna Heuer/Extender: Jeri Cos Weeks in Treatment: 21 Non-Wound Condition: Condition: Lymphedema Location: Leg Side: Right Photos Electronic Signature(s) Signed: 06/28/2021 4:23:33 PM By: Donnamarie Poag Entered By: Donnamarie Poag on 06/28/2021 14:21:11 [name] ([id number]) -------------------------------------------------------------------------------- Non-Wound Condition Assessment Details Patient Name: [name] Date of Service: 06/28/2021 1:45 PM Medical Record Number: [id number] Patient Account Number: [id number] Date of Birth/Sex: 1951/04/12 (71 y.o. M) Treating RN: Donnamarie Poag Primary Care Hamlet Lasecki: Felipa Eth Other Clinician: Referring Kelon Easom: Felipa Eth Treating Giuseppe Duchemin/Extender: Skipper Cliche in Treatment: 21 Non-Wound Condition: Condition: Lymphedema Location: Leg Side: Left Notes: Anterior aspect of lower leg Photos Notes increased foul smelling drainage noted-serous Electronic Signature(s) Signed: 06/28/2021 4:23:33 PM By: Donnamarie Poag Entered By: Donnamarie Poag on 06/28/2021 14:21:47 [name] ([id number]) -------------------------------------------------------------------------------- Pain Assessment Details Patient Name: [name] Date of Service: 06/28/2021 1:45 PM Medical Record Number: [id number] Patient Account Number: [id number] Date of Birth/Sex: December 22, 1950 (71 y.o. M) Treating RN: Donnamarie Poag Primary Care Renesme Kerrigan: Felipa Eth Other Clinician: Referring Orion Mole: Felipa Eth Treating Elantra Caprara/Extender: Skipper Cliche in Treatment: 21 Active Problems Location of Pain Severity and Description of Pain Patient Has Paino No Site Locations Rate the pain. Current Pain Level: 0 Pain Management and Medication Current Pain Management: Electronic Signature(s) Signed: 06/28/2021 4:23:33 PM By: Donnamarie Poag Entered By: Donnamarie Poag on 06/28/2021 14:09:55 [name] ([id number]) -------------------------------------------------------------------------------- Patient/Caregiver Education Details Patient Name: [name] Date of Service: 06/28/2021 1:45 PM Medical Record Number: [id number] Patient Account Number: [id number] Date of Birth/Gender: 1950-09-15 (71 y.o. M) Treating RN: Donnamarie Poag Primary Care Physician: Felipa Eth Other Clinician: Referring Physician: Felipa Eth Treating Physician/Extender: Skipper Cliche in Treatment: 21 Education Assessment Education Provided To: Patient Education Topics Provided Wound/Skin Impairment: Electronic Signature(s) Signed: 06/28/2021 4:23:33 PM By: Donnamarie Poag Entered By: Donnamarie Poag on 06/28/2021 15:00:26 [name] ([id number]) -------------------------------------------------------------------------------- [place] Details Patient Name: [name] Date of Service: 06/28/2021 1:45 PM Medical Record Number: [id number] Patient Account Number: [id number] Date of Birth/Sex: 08/11/1950 (71 y.o. M) Treating RN: Donnamarie Poag Primary Care Solyana Nonaka: Felipa Eth Other Clinician: Referring Tenia Goh: Felipa Eth Treating Aleksis Jiggetts/Extender: Skipper Cliche in Treatment: 21 Vital Signs Time Taken: 14:08 Temperature (F):  98.2 Height (in): 73 Pulse (bpm): 66 Weight (lbs): 312 Respiratory Rate (breaths/min): 16 Body Mass Index (BMI): 41.2 Blood Pressure (mmHg): 106/65 Reference Range: 80 - 120  mg / dl Electronic Signature(s) Signed: 06/28/2021 4:23:33 PM By: Donnamarie Poag Entered ByDonnamarie Poag on 06/28/2021 14:09:31

## 2021-07-05 ENCOUNTER — Other Ambulatory Visit: Payer: Self-pay

## 2021-07-05 ENCOUNTER — Encounter: Payer: PPO | Admitting: Physician Assistant

## 2021-07-05 DIAGNOSIS — E11622 Type 2 diabetes mellitus with other skin ulcer: Secondary | ICD-10-CM | POA: Diagnosis not present

## 2021-07-05 NOTE — Progress Notes (Addendum)
RYZEN, DEADY ([id number]) Visit Report for 07/05/2021 Chief Complaint Document Details Patient Name: [name], [name] Date of Service: 07/05/2021 1:45 PM Medical Record Number: [id number] Patient Account Number: [id number] Date of Birth/Sex: 25-Jun-1950 (71 y.o. M) Treating RN: Donnamarie Poag Primary Care Provider: Felipa Eth Other Clinician: Referring Provider: Felipa Eth Treating Provider/Extender: Skipper Cliche in Treatment: 22 Information Obtained from: Patient Chief Complaint Left LE ulcers Electronic Signature(s) Signed: 07/05/2021 2:05:31 PM By: Worthy Keeler PA-C Entered By: Worthy Keeler on 07/05/2021 14:05:31 [name] ([id number]) -------------------------------------------------------------------------------- HPI Details Patient Name: [name] Date of Service: 07/05/2021 1:45 PM Medical Record Number: [id number] Patient Account Number: [id number] Date of Birth/Sex: [birth year] (71 y.o. M) Treating RN: Donnamarie Poag Primary Care Provider: Felipa Eth Other Clinician: Referring Provider: Felipa Eth Treating Provider/Extender: Skipper Cliche in Treatment: 22 History of Present Illness HPI Description: 71 year old male who presented to the ER with bilateral lower extremity blisters which had started last week. he has a past medical history of leukemia, diabetes mellitus, hypertension, edema of both lower extremities, his recurrent skin infections, peripheral vascular disease, coronary artery disease, congestive heart failure and peripheral neuropathy. in the ER he was given Rocephin and put on Silvadene cream. he was put on oral doxycycline and was asked to follow-up with the [facility]. His last hemoglobin A1c was 6.6 in December and he checks his blood sugar once a week. He does not have any physicians outside the [place] system. He does not recall any vascular duplex studies done either for arterial or venous disease but was told to wear compression  stockings which he does not use 05/30/2016 -- we have not yet received any of his notes from the [facility] hospital system and his arterial and venous duplex studies are scheduled here in [place] around mid February. We are unable to have his insurance accepted by home health agencies and hence he is getting dressings only once a week. 06/06/16 -- -- I received a call from the patient's PCP at the [facility] at [facility] and spoke to Dr. Garvin Fila, phone number [phone] and fax number [phone]. She confirmed that no vascular testing was done over the last 5 years and she would be happy to do them if the patient did want them to be done at the [place] and we could fax him a request. Readmission: 71 year old male seen by as in February of this year and was referred to vein and vascular for studies and opinion from the vascular surgeons. The patient returns today with a fresh problem having had blisters on his left lower extremity which have been there for about 5 days and he clearly states that he has been wearing his compression stockings as advised though he could not read the moderate compression and has been wearing light compression. Review of his electronic medical records note that he had lower extremity arterial duplex examination done on 06/23/2016 which showed no hemodynamically significant stenosis in the bilateral lower extremity arterial system. He also had a lower extremity venous reflux examination done on 07/07/2016 and it was noted that he had venous incompetence in the right great saphenous vein and bilateral common femoral veins. Patient was seen by Dr. Tamala Julian on the same day and for some reason his notes do not reflect the venous studies or the arterial studies and he recommended patient do a venous duplex ultrasound to look for reflux and return to see him.he would also consider a lymph pump if required. The patient was told that his workup  was normal and hence the patient canceled his  follow-up appointment. 02/03/17 on evaluation today patient left medial lower extremity blister appears to be doing about the same. It is still continuing to drain and there's still the blistered skin covering the wound bed which is making it difficult for the alternate to do its job. Fortunately there is no evidence of cellulitis. No fevers chills noted. Patient states in general he is not having any significant discomfort. Patient's lower extremity arterial duplex exam revealed that patient was hemodynamically stable with no evidence of stenosis in regard to the bilateral lower extremities. The lower extremity venous reflux exam revealed the patient had venous incontinence noted in the right greater saphenous and bilateral common femoral vein. There is no evidence of deep or superficial vein thrombosis in the bilateral lower extremities. Readmission: 11/12/18 Patient presents for evaluation our clinic today concerning issues that he is having with his left lower extremity. He tells me that a couple weeks ago he began developing blisters on the left lower extremity along with increased swelling. He typically wears his compression stockings on a regular basis is previously been evaluated both here as well is with vascular surgery they would recommend lymphedema pumps but unfortunately that somehow fell through and he never heard anything back from that. Nonetheless I think lymphedema pumps would be beneficial for this patient. He does have a history of hypertension and diabetes. Obviously the chronic venous stasis and lymphedema as well. At this point the blisters have been given in more trouble he states sometimes when the blisters openings able to clean it down with alcohol and it will dry out and do well. Unfortunately that has not been the case this time. He is having some discomfort although this mean these with cleaning the areas he doesn't have discomfort just on a regular basis. He has not been  able to wear his compression stockings since the blisters arose due to the fact that of course it will drain into the socks causing additional issues and he didn't have any way to wrap this otherwise. He has increased to taking his Lasix every day instead of every other day. He sees his primary care provider later this month as well. No fevers, chills, nausea, or vomiting noted at this time. 11/19/18-Patient returns at 1 week, per intake RN the amount of seepage into the compression wraps was definitely improved, overall all the wounds are measuring smaller but continuing silver alginate to the wounds as primary dressing 11/26/18 on evaluation today patient appears to be doing quite well in regard to his left lower [place] ulcers. In fact of the areas that were noted initially he only has two regions still open. There is no evidence of active infection at this time. He still is not heard anything from the company regarding lymphedema pumps as of yet. Again as previously seen vascular they have not recommended any surgical intervention. 12/03/2018 on evaluation today patient actually appears to be doing quite well with regard to his lower extremity ulcers. In fact most of the areas appear to be healed the one spot which does not seem to be completely healed I am unsure of whether or not this is really draining that much but nonetheless there does not appear to be any signs of infection or significant drainage at this point. There is no sign of fever, chills, nausea, vomiting, or diarrhea. Overall I am pleased with how things have progressed I think is very close to being able to transition  to his home compression stockings. [name], [name] ([id number]) 12/10/2018 upon evaluation today patient appears to be doing quite well with regard to his left lower extremity. He has been tolerating the dressing changes without complication. Fortunately there is no signs of active infection at this time. He appears after  thorough evaluation of his leg to only have 1 small area that remains open at this point everything else appears to be almost completely closed. He still have significant swelling of the left lower extremity. We had discussed discussing this with his primary care provider he is not able to see her in person they were at the [facility] and right now the [place] is not seeing patients on site. According to the patient anyway. Subsequently he did speak with her apparently and his primary care provider feels that he may likely have a DVT. With that being said she has not seen his leg she is just going off of his history. Nonetheless that is a concern that the patient now has as well and while I do not feel the DVT is likely we can definitely ensure that that is not the case I will go ahead and see about putting that order in today. Nonetheless otherwise I am in a recommend that we continue with the current wound care measures including the compression therapy most likely. We just need to ensure that his leg is indeed free of any DVTs. 12/17/2018 on evaluation today patient actually appears to be completely healed today. He does have 2 very small areas of blistering although this is not anything too significant at this point which is good news. With that being said I am in agreement with the fact that I think he is completely healed at this point. He does want to get back into his compression stocking. The good news is we have gotten approval from insurance for his lymphedema pumps we received a letter since last saw him last week. The other good news is his study did come back and showed no evidence of a DVT. 12/20/2018 on evaluation today patient presents for follow-up concerning his ongoing issues with his left lower extremity. He was actually discharged last Friday and did fairly well until he states blisters opened this morning. He tells me he has been wearing his compression stocking although he has a hard  time getting this on. There does not appear to be any signs of active infection at this time. No fevers, chills, nausea, vomiting, or diarrhea. 12/27/2018 on evaluation today patient appears to be doing very well with regard to his swelling of the left lower extremity the 4 layer compression wrap seems to have been beneficial for him. Fortunately there is no signs of active infection at this time. Patient has been tolerating the compression wrap without complication and his foot swelling in particular appears to be greatly improved. He does still have a wound on the lateral portion of his left leg I believe this is more of a blister that has now reopened. 01/03/2019 on evaluation today patient actually appears to be doing excellent in regard to his left lower extremity. He did receive his compression pumps and is actually use this 7 times since he was last here in the office. On top of the compression wrap he is now roughly 3 cm better at the calf and 2 cm better at the ankle he also states that his foot seem to go an issue better without even having to use a shoe horn. Obviously I  think this is all evidence that he is doing excellent in this regard. The other good news is he does not appear to have anything open today as far as wounds are concerned. 01/15/2019 on evaluation today patient appears to be doing more poorly yet again with regard to his left lower extremity. He has developed new wounds again after being discharged just recently. Unfortunately this continues to be the case that he will heal and then have subsequent new wounds. The last time I was hopeful that he may not end up coming back too quickly especially since he states he has been using his lymphedema pumps along with wearing his compression. Nonetheless he had a blister on the back of his leg that popped up on the left and this has opened up into an ulceration it is quite painful. 01/22/19 on evaluation today patient actually appears  to be doing well with regard to his wound on the left lower extremity. He's been tolerating the dressing changes without complication including the compression wrap in the wound appears to be significantly smaller today which is great news. Overall very pleased in this regard. 01/29/2019 on evaluation today patient appears to be doing well with regard to his left posterior lower extremity ulcer. He has been tolerating the dressing changes without complication. This is not completely healed but is getting much closer. We did order a Farrow wrap 4000 for him he has received this and has it with him today although I am not sure we are quite ready to start him on that as of yet. We are very close. 02/05/2019 on evaluation today patient actually appears to be doing quite well with regard to his left posterior lower extremity ulcer. He still has a very tiny opening remaining but the fortunate thing is he seems to be healing quite nicely. He also did get his Farrow wrap which I am hoping will help with his edema control as well at home. Fortunately there is no evidence of active infection. 02/12/2019 patient and fortunately appears to be doing poorly in regard to his wounds of the left lower extremity. He was very close to healing therefore we attempted to use his Velcro compression wraps continuing with lymphedema pumps at home. Unfortunately that does not seem to have done very well for him. He tells me that he wore them all the time but again I am not sure why if that is the case that he is having such significant edema. He is still on his fluid pills as well. With that being said there is no obvious sign of infection although I do wonder about the possibility of infection at this time as well. 02/19/2019 unfortunately upon evaluation today patient appears to be doing more poorly with regard to his left lower extremity. He is not showing signs of significant improvement and I think the biggest issue here is  that he does have an infection that appears to likely be Pseudomonas. That is based on the blue-green drainage that were noted at this time. Unfortunately the antibiotic that has been on is not going to take care of this at all. I think they will get a need to switch him to either Levaquin or Cipro and this was discussed with the patient. 02/26/2019 on evaluation today patient's lower extremity on the left appears to be doing significantly better as compared to last evaluation. Fortunately there is no signs of active infection at this time. He has been tolerating the compression wrap without complication in  fact he made it the whole week at this point. He is showing signs of excellent improvement I am very happy in this regard. With that being said he is having some issues with infection we did review the results of his culture which I noted today. He did have a positive finding for Enterobacter as well as Alcaligenes faecalis. Fortunately the Levaquin that I placed him on will work for both which is great news. There is no signs of systemic infection at this point. 10/30; left posterior leg wound in the setting of very significant edema and what looks like chronic venous inflammation. He has compression pumps but does not use them. We have been using 3 layer compression. Silver alginate to the wound as the primary dressing 03/18/2019 on evaluation today patient appears to be doing a little better compared to last time I saw him. He really has not been using his compression pumps he tells me that he is having too much discomfort. He has been keeping his wraps on however. He is only been taking his fluid pills every other day because he states they are not really helping and he has an appointment with his primary care provider at the [facility] tomorrow. Subsequently the wound itself on the left lower extremity does seem to be greatly improved compared to previous. 03/25/2019 on evaluation today patient appears  to be doing better with regard to his wounds on the bilateral lower extremities. The left is doing excellent the right is also doing better although both still do show some signs of open wounds noted at this point unfortunately. Fortunately there is no signs of active infection at this time. The patient also is not really having any significant pain which is good news. Unfortunately there was some confusion with the referral on vascular disease and as far as getting the patient scheduled there can be contacting him later today to do this [name], [name] ([id number]) fortunately we got this straightened out. 04/01/2019 on evaluation today patient appears to be doing no fevers, chills, nausea, vomiting, or diarrhea. Excellent at this time with regard to his lower extremities. There does not appear to be any open wound at this point which is good news. Fortunately is also no signs of active infection at this time. Overall feel like the patient has done excellent with the compression the problem is every time we got him to this point and then subsequently go to using his own compression things just go right back to where they were. I am not sure how to address this we can try to get an appointment with vascular for 2 weeks now they have yet to call him. Obviously this has become frustrating for the patient as well. I think the issue has just been an honest error as far as scheduling is concerned but nonetheless still worn out the point where I am unsure of which direction we should take. 04/08/2019 on evaluation today patient actually appears to be doing well with regard to his lower extremities. There are no open wounds at this time and things seem to be managing quite nicely as far as the overall edema control is concerned. With that being said he does have his compression socks today for [place] to go ahead and reinitiate therapy in that manner at this point. He is going to be going for shoes to be  measured on Wednesday and then coincidentally he will also be seeing vascular on Thursday. Overall I think this is good news and  again I am hopeful that they will be able to do something for him to help prevent ongoing issues with edema control as well. No fevers, chills, nausea, vomiting, or diarrhea. 04/11/2019 on evaluation today patient actually appears to be doing poorly after just being discharged on Monday of this week. He had been experiencing issues with again blisters especially on the left lower extremity. With that being said he was completely healed and appeared to be doing great this past Monday. He then subsequently has new blisters that formed before his appointment with vascular this morning. He was also measured for shoes in the interim. With that being said we may have figured out what exactly is going on and why he continues to have issues like what we are seeing at this point. He takes his compression stockings off at nighttime and then he ends up having to sleep in his chair for 5-6 hours a night. He sleeps with his feet down he cannot really get him up in the recliner and therefore he is sleeping and the worst possible his position with his feet on the floor for that majority of the time. Again as I explained to him that is about one third at minimum at least one fourth of his day that he spending with his feet dangling down on the ground and the worst possible position they could be. I think this may be what is causing the issue. Subsequently I am leaning toward thinking that he may need a hospital bed in order to elevate his legs. We likely can have to coordinate this with his primary care provider at the [facility]. Readmission: 01/26/2021 this is a patient who presents for repeat evaluation here in the clinic although it is actually been couple of years since have seen him in fact it was December 2020 when I last saw him. Subsequently he never really healed but did end up  being lost to follow-up. He tells me has been having issues ongoing with his lower extremities has bilateral lower extremity lymphedema no real significant or definitive open wounds but in general his lymphedema is way out of control. We were never able to refer him to lymphedema clinic simply due to the fact to be honest we were never able to get him completely healed. I do not see anyone with open wounds. The patient does have evidence of type 2 diabetes mellitus, lymphedema, chronic venous insufficiency, and hypertension. That really has not changed since his last evaluation. 02/09/2021 upon evaluation today patient appears to be doing a little better in regard to his legs although he still having a tremendous amount of drainage especially on the left leg. Fortunately there does not appear to be any evidence of active infection. Of note when we looked into this further it appears that the patient did not have any absorptive dressing on it was just the 4-layer compression wrap. Nonetheless this is probably big part of the issue here. 10/10; he comes in today with 3 large areas on the upper right lower leg likely remanence of denuded blistering under his compression wraps. He has no other wounds on the right. On the left he has the denuded area on the left medial foot and ankle and on the left dorsal foot. Massive lymphedema in both feet dorsally. Using Zetuvit under compression We have increased home health visitation to twice a week to change the dressings and will change it once 02/22/2021 upon evaluation today patient appears to be doing well currently  with regard to his wounds. He has been tolerating the dressing changes without complication. Fortunately there does not appear to be any evidence of active infection which is great news. No fevers, chills, nausea, vomiting, or diarrhea. The biggest issue I see currently is that home health is not putting any medicine on the actual wounds  before wrapping. 03/01/2021 upon evaluation today the patient's right leg actually appears to be doing quite well which is great news there does not appear to be any evidence of active infection at this time. No fevers, chills, nausea, vomiting, or diarrhea. With that being said the patient is having issues on the left foot where he is having significant drainage is also an ammonia smell he does not have any animals at home and this makes me concerned about a bacteria producing urea as a byproduct. Again the possible common organisms will be E. coli, Proteus, and Enterococcus. All 3 of which can be successfully treated with Levaquin. For that reason I think that this may be a good option for [place] to consider placing him on and I did obtain a culture as well for confirmation sake. 03/08/2021 upon evaluation today patient appears to be doing unfortunately still somewhat poorly in regard to his leg ulcerations. He actually has an area on the right leg where he blistered due to the fact that his wrap slid down and caused an area of pinching on his skin and this has led to a significant issue here. 03/15/2021 upon evaluation today patient unfortunately has not been wrapped appropriately with absorptive dressings nor with the appropriate technique for the third layer of the 4-layer compression wrap. These are issues that we continue to try to address with the home health nurse. Also the absorptive dressing that she had was cut in half and therefore that causes things to leak out it does not actually trap the fluid in regard to the top of the foot overall I think that all these combined are really not seeing things improved significantly here. Fortunately there does not appear to be any signs of significant infection at this time which is good news. He still is having a tremendous amount of drainage. 03/22/2021 upon evaluation today patient appears to be draining tremendously. He still continues to tell me  that he is using his pumps 2 times a day and that coupled with that tells me that he is elevating his legs as well. With that being said all things considered I am really just not seeing the improvement we would expect to see with the 4-layer compression wrap and all the above noted. He in fact had an extremely large Zetuvit dressing on both legs and that they were extremely filled to the max with fluid. This is after just being changed just before the weekend and this is Monday. Nonetheless I am concerned about the fact that there is something going on fairly significant that we cannot get any of this under control and that he is draining this significantly. He supposed be having an echocardiogram it sounds like scheduling has been an issue for him as far as getting in sooner. Its something to do with needing his cousin to drive him because of where it sat and he cannot drive himself to this appointment either way I really think he needs to try to see what he can do about making this happen a little sooner. He tells me he will call today. [name], [name] ([id number]) 03/29/2021 on evaluation today patient appears to be  doing about the same in regard to his legs. He did get his cardiology appointment moved up to 6 December which is at least good that is better than what it was before mid December. Overall very pleased in that regard. 04/05/2021 upon evaluation today patient unfortunately is still doing fairly poorly. There does not appear to be any signs of active infection at this time. No fevers, chills, nausea, vomiting, or diarrhea. Unfortunately I think until his edema is under control and overall fluid overload there is really not to be much chance that I can do much to get him better. This is quite unfortunate and frustrating both for myself and the patient to be perfectly honest. Nonetheless I think that he really needs to have a conversation both with his primary care provider as well as  cardiologist he sees the PCP on Monday and cardiology on Wednesday of next week. 04/13/2021 upon evaluation today patient appears to be doing poorly in regard to his bilateral lower extremities his left is still worse than the right. With that being said he has a tremendous amount of drainage he did see his primary care provider yesterday there really was not much there to be done from their perspective. He sees cardiology tomorrow. Nonetheless my biggest concern here is simply that if we do not get the edema under control he is going to continue to have drainage and honestly I think at some point he is going to become infected severely that is my main concern. 04/19/2021 upon evaluation today patient appears to be doing poorly still in regard to his legs. Unfortunately there does not appear to be any signs of infection at this point. He does have a tremendous amount of drainage however. We have not seen the results back from the cardiologist and the echocardiogram that was done. It appears that the patient checked out okay as far as that is concerned with regard to ejection fraction though we still have some issues here to be honest with his diastolic function. I am unsure if this is accounting for everything that we are seeing or not. Either way he has a tremendous amount of drainage from his legs that we are just not able to control in the outpatient setting at this point. I have reached out to Dr. Rockey Situ his cardiologist to see once he reviews the sheet if there is anything that he feels like can be done from an outpatient perspective if not then I think the way to go is probably can to be through inpatient admission and diuresis. Otherwise I am not sure how working to get this under control we tried antibiotics, compression wrapping, and I have told the patient to be elevating his legs I am not sure how much he does of this but either way I think that this is still an ongoing issue  nonetheless. 04/26/2021 upon evaluation today patient appears to be doing poorly in regard to his legs. He is having a tremendous amount of fluid at this point which is quite unfortunate. Its to the point that he may have had at least 5 to 10 pounds of fluid in his dressings this morning when they were removed these were changed this Friday. Subsequently I think he needs to go to the ER for further evaluation and treatment I think is probably can need diuresis possibly even IV antibiotics been on what the blood work looks like but in general I feel like he needs something to get this under control from  an outpatient perspective absent of everywhere I can think of and I cannot get this under control with our traditional measures. I think this is going require more so that we can get him better 12/30; this is a patient with severe bilateral lymphedema. He was hospitalized from 04/26/2021 through 04/29/2021 treated for cellulitis in the setting of lower extremity ulcers and lymphedema. After he left the hospital he is apparently seen for nurse visit our staff contacted cardiology and he has been started on Lasix 40 mg. Apparently his legs have less edema. Lab work from 05/04/2021 showed a BUN of 38 and creatinine of 1.59 these are elevated versus previous where his creatinine seems to have been 1.30 on 12/19 his potassium is 4.3. I believe the lab work is being followed by cardiology We have him in a 4-layer wrap. Xeroform on the leg wounds and sit to fit on the Berry damage skin on the left dorsal foot versus right dorsal foot. He has compression pumps but does not use them. We have apparently not yet ordered him compression stockings 05/17/2021 upon evaluation today patient's legs though better than last time I personally saw him appear to be getting worse compared to where they were previous. Dr. Quentin Cornwall was actually last 1 to see you I have not seen him since 19 December. That was before he went into  the ER. Coming out apparently his legs looked also and they still look better but not as good as they were in the past. 1/16; patient with severe bilateral lymphedema. Severe scaled hyperkeratotic skin on the dorsal aspect of his distal left foot and left medial ankle.. On the right side changes are not as bad. He did not have any weeping edema. Our intake nurse was convinced that he is being compliant with compression pumps 1 hour twice a day 05/31/2021 upon evaluation today patient actually appears to be doing a little bit better in my opinion in regard to his feet. I do not see as much drainage and it being just completely wet as it was previous. Fortunately I do not also see any signs of active infection which is great news as well. 06/07/21 Upon inspection patient's wound bed actually showed signs of doing well he is not nearly as weepy and wet as he has been in the past and overall very pleased in that regard. Fortunately I do not see any signs of active infection locally or nor systemically at this time. Which is great news. No fevers, chills, nausea, vomiting, or diarrhea. 06/14/2021 upon evaluation today patient appears to be doing well with regard to his right foot I am pleased in that regard. His left foot is still draining quite a bit despite using lymphedema pumps, 4-layer compression wraps, and he tells me elevating his legs as well. He also has Lasix that he takes twice a day. Nonetheless I believe that this is still good to be an ongoing issue. We have a hard time getting this under control as far as the swelling is concerned. 06/21/2021 upon evaluation today patient appears to be doing decently well in regard to his wounds all things considered. He still has a tremendous amount of drainage and fluid noted at this point. Fortunately I do not see any signs of active infection locally or systemically at this point which is great news. Nonetheless I am unsure where to go and how to do this  as far as trying to limit his swelling and weeping from his toes in particular.  06/28/2021 upon evaluation patient unfortunately continues to have significant drainage from his feet. We have been keeping him in a compression wrap and despite this he still continues to have extreme fluid issues he seen his cardiologist he is seeing the nephrologist. We really cannot find any way to get this under control when he did well was when he was in the hospital and they got some of the fluid away. But outside of that we are just struggling to achieve the long-term goal of getting this under control and keeping it under control unfortunately. 07/05/2021 upon evaluation today patient appears to be doing poorly in regard to his feet. Unfortunately this continues to be a significant issue and to be honest I am really not certain what to do about it. I referred him to Dr. Randol Kern at [facility] and he does have an appointment although it is 12 April. He also sees his primary care provider on 6 April. He did not want to see Dr. Haynes Kerns until after he saw his PCP that is the reason the appointment so far out. There is really not much I can do in that regard. Nonetheless I do think that we are still continue to have significant lymphedema issues with significant mount of weeping in regard to the feet and again this has just become extremely difficult to manage to be honest I am not sure if there is something else that Dr. Haynes Kerns or someone else could recommend he also will be seeing Dr. Dellia Nims in 2 weeks when I am on vacation and at that time I will see if Dr. Dellia Nims has any ideas about where to go from here in the meantime. [name], [name] ([id number]) Electronic Signature(s) Signed: 07/05/2021 4:18:43 PM By: Worthy Keeler PA-C Entered By: Worthy Keeler on 07/05/2021 16:18:43 [name] ([id number]) -------------------------------------------------------------------------------- Physical Exam Details Patient Name:  [name] Date of Service: 07/05/2021 1:45 PM Medical Record Number: [id number] Patient Account Number: [id number] Date of Birth/Sex: Oct 06, 1950 (71 y.o. M) Treating RN: Donnamarie Poag Primary Care Provider: Felipa Eth Other Clinician: Referring Provider: Felipa Eth Treating Provider/Extender: Skipper Cliche in Treatment: 22 Constitutional Obese and well-hydrated in no acute distress. Respiratory normal breathing without difficulty. Psychiatric this patient is able to make decisions and demonstrates good insight into disease process. Alert and Oriented x 3. pleasant and cooperative. Notes Upon inspection patient continues to have significant weeping of his bilateral lower extremities. This is to the point that to be perfectly honest it is difficult to even keep anything dry changing this 3 times a week as far as home health is concerned. We have literally tried everything that I can think of at the moment and nothing really seems to be working the only time he got better is when he was in the hospital on the diuretics at that time it did slow down tremendously he came out in much better shape but this is slowly creep back up to where it was previous hospital stay. Electronic Signature(s) Signed: 07/05/2021 4:19:21 PM By: Worthy Keeler PA-C Entered By: Worthy Keeler on 07/05/2021 16:19:20 [name] ([id number]) -------------------------------------------------------------------------------- Physician Orders Details Patient Name: [name] Date of Service: 07/05/2021 1:45 PM Medical Record Number: [id number] Patient Account Number: [id number] Date of Birth/Sex: 12/18/1950 (71 y.o. M) Treating RN: Donnamarie Poag Primary Care Provider: Felipa Eth Other Clinician: Referring Provider: Felipa Eth Treating Provider/Extender: Skipper Cliche in Treatment: 22 Verbal / Phone Orders: No Diagnosis Coding ICD-10 Coding Code Description E11.622 Type 2 diabetes  mellitus with other skin ulcer I89.0 Lymphedema, not elsewhere classified I87.2 Venous insufficiency (chronic) (peripheral) L97.822 Non-pressure chronic ulcer of other part of left lower leg with fat layer exposed L97.512 Non-pressure chronic ulcer of other part of right foot with fat layer exposed I10 Essential (primary) hypertension L97.811 Non-pressure chronic ulcer of other part of right lower leg limited to breakdown of skin Follow-up Appointments o Return Appointment in 1 week. o Nurse Visit as needed o Other: - Keep appt at [facility] with PCP 08/12/21 Keep appt with [facility] Wound center Dr. Haynes Kerns 08/18/21 [place]: - [place]-see dressing changes notes on powder between toes-- o [place] for wound care. May utilize formulary equivalent dressing for wound treatment orders unless otherwise specified. Home Health Nurse may visit PRN to address patientos wound care needs. - frequency 3 times per week - patient will be seen once at wound center - home health to see patient 2 times per week o Scheduled days for dressing changes to be completed; exception, patient has scheduled wound care visit that day. o **Please direct any NON-WOUND related issues/requests for orders to patient's Primary Care Physician. **If current dressing causes regression in wound condition, may D/C ordered dressing product/s and apply Normal Saline Moist Dressing daily until next [place] or Other MD appointment. **Notify Wound Healing Center of regression in wound condition at [phone]. Bathing/ Shower/ Hygiene o May shower with wound dressing protected with water repellent cover or cast protector. o No tub bath. Edema Control - Lymphedema / Segmental Compressive Device / Other Bilateral Lower Extremities o Optional: One layer of unna paste to top of compression wrap (to act as an anchor). - Unna paste on calf to secure wrap in place as needed o 4  Layer Compression System Lymphedema. - ****bilateral legs-total of 3 x per week bi lat, nystatin powder between toes on bilateral toes, 2x2 gauze between toes only needed on left side, apply zetuvits/ or formulary for extra absorbant dressing around ankles and foot area, use ABD to cover toes; 2 times per week with HH and 1 time at wound center [place], LIGHT TAN SPIRAL. WHITE WITH YELLOW LINE FIGURE 8 , COBAN SPIRAL o Elevate, Exercise Daily and Avoid Standing for Long Periods of Time. o Elevate legs to the level of the heart and pump ankles as often as possible o Elevate leg(s) parallel to the floor when sitting. o Compression Pump: Use compression pump on left lower extremity for 60 minutes, twice daily. - 2 times per day o DO YOUR BEST to sleep in the bed at night. DO NOT sleep in your recliner. Long hours of sitting in a recliner leads to swelling of the legs and/or potential wounds on your backside. o Other: - Contact prescriber regarding use of diuretics to reduce fluid overload. Off-Loading o Turn and reposition every 2 hours Additional Orders / Instructions o Follow Nutritious Diet and Increase Protein Intake Electronic Signature(s) [name], [name] ([id number]) Signed: 07/05/2021 4:22:58 PM By: Donnamarie Poag Signed: 07/05/2021 5:31:39 PM By: Worthy Keeler PA-C Entered By: Donnamarie Poag on 07/05/2021 14:22:08 [name] ([id number]) -------------------------------------------------------------------------------- Problem List Details Patient Name: [name] Date of Service: 07/05/2021 1:45 PM Medical Record Number: [id number] Patient Account Number: [id number] Date of Birth/Sex: 1951-01-16 (71 y.o. M) Treating RN: Donnamarie Poag Primary Care Provider: Felipa Eth Other Clinician: Referring Provider: Felipa Eth Treating Provider/Extender: Skipper Cliche in Treatment: 22 Active Problems ICD-10 Encounter Code Description Active Date  MDM Diagnosis  E11.622 Type 2 diabetes mellitus with other skin ulcer 01/26/2021 No Yes I89.0 Lymphedema, not elsewhere classified 01/26/2021 No Yes I87.2 Venous insufficiency (chronic) (peripheral) 01/26/2021 No Yes L97.822 Non-pressure chronic ulcer of other part of left lower leg with fat layer 01/26/2021 No Yes exposed L97.512 Non-pressure chronic ulcer of other part of right foot with fat layer 01/26/2021 No Yes exposed I10 Essential (primary) hypertension 01/26/2021 No Yes L97.811 Non-pressure chronic ulcer of other part of right lower leg limited to 02/15/2021 No Yes breakdown of skin Inactive Problems Resolved Problems Electronic Signature(s) Signed: 07/05/2021 2:05:26 PM By: Worthy Keeler PA-C Entered By: Worthy Keeler on 07/05/2021 14:05:25 [name] ([id number]) -------------------------------------------------------------------------------- Progress Note Details Patient Name: [name] Date of Service: 07/05/2021 1:45 PM Medical Record Number: [id number] Patient Account Number: [id number] Date of Birth/Sex: [birth year] (71 y.o. M) Treating RN: Donnamarie Poag Primary Care Provider: Felipa Eth Other Clinician: Referring Provider: Felipa Eth Treating Provider/Extender: Skipper Cliche in Treatment: 22 Subjective Chief Complaint Information obtained from Patient Left LE ulcers History of Present Illness (HPI) 71 year old male who presented to the ER with bilateral lower extremity blisters which had started last week. he has a past medical history of leukemia, diabetes mellitus, hypertension, edema of both lower extremities, his recurrent skin infections, peripheral vascular disease, coronary artery disease, congestive heart failure and peripheral neuropathy. in the ER he was given Rocephin and put on Silvadene cream. he was put on oral doxycycline and was asked to follow-up with the [facility]. His last hemoglobin A1c was 6.6 in December and he checks his blood  sugar once a week. He does not have any physicians outside the [place] system. He does not recall any vascular duplex studies done either for arterial or venous disease but was told to wear compression stockings which he does not use 05/30/2016 -- we have not yet received any of his notes from the [facility] hospital system and his arterial and venous duplex studies are scheduled here in [place] around mid February. We are unable to have his insurance accepted by home health agencies and hence he is getting dressings only once a week. 06/06/16 -- -- I received a call from the patient's PCP at the [facility] and spoke to Dr. Garvin Fila, phone number [phone] and fax number [phone]. She confirmed that no vascular testing was done over the last 5 years and she would be happy to do them if the patient did want them to be done at the [place] and we could fax him a request. Readmission: 71 year old male seen by as in February of this year and was referred to vein and vascular for studies and opinion from the vascular surgeons. The patient returns today with a fresh problem having had blisters on his left lower extremity which have been there for about 5 days and he clearly states that he has been wearing his compression stockings as advised though he could not read the moderate compression and has been wearing light compression. Review of his electronic medical records note that he had lower extremity arterial duplex examination done on 06/23/2016 which showed no hemodynamically significant stenosis in the bilateral lower extremity arterial system. He also had a lower extremity venous reflux examination done on 07/07/2016 and it was noted that he had venous incompetence in the right great saphenous vein and bilateral common femoral veins. Patient was seen by Dr. Tamala Julian on the same day and for some reason his notes do not reflect the venous studies or the  arterial studies and he recommended patient do a  venous duplex ultrasound to look for reflux and return to see him.he would also consider a lymph pump if required. The patient was told that his workup was normal and hence the patient canceled his follow-up appointment. 02/03/17 on evaluation today patient left medial lower extremity blister appears to be doing about the same. It is still continuing to drain and there's still the blistered skin covering the wound bed which is making it difficult for the alternate to do its job. Fortunately there is no evidence of cellulitis. No fevers chills noted. Patient states in general he is not having any significant discomfort. Patient's lower extremity arterial duplex exam revealed that patient was hemodynamically stable with no evidence of stenosis in regard to the bilateral lower extremities. The lower extremity venous reflux exam revealed the patient had venous incontinence noted in the right greater saphenous and bilateral common femoral vein. There is no evidence of deep or superficial vein thrombosis in the bilateral lower extremities. Readmission: 11/12/18 Patient presents for evaluation our clinic today concerning issues that he is having with his left lower extremity. He tells me that a couple weeks ago he began developing blisters on the left lower extremity along with increased swelling. He typically wears his compression stockings on a regular basis is previously been evaluated both here as well is with vascular surgery they would recommend lymphedema pumps but unfortunately that somehow fell through and he never heard anything back from that. Nonetheless I think lymphedema pumps would be beneficial for this patient. He does have a history of hypertension and diabetes. Obviously the chronic venous stasis and lymphedema as well. At this point the blisters have been given in more trouble he states sometimes when the blisters openings able to clean it down with alcohol and it will dry out and do well.  Unfortunately that has not been the case this time. He is having some discomfort although this mean these with cleaning the areas he doesn't have discomfort just on a regular basis. He has not been able to wear his compression stockings since the blisters arose due to the fact that of course it will drain into the socks causing additional issues and he didn't have any way to wrap this otherwise. He has increased to taking his Lasix every day instead of every other day. He sees his primary care provider later this month as well. No fevers, chills, nausea, or vomiting noted at this time. 11/19/18-Patient returns at 1 week, per intake RN the amount of seepage into the compression wraps was definitely improved, overall all the wounds are measuring smaller but continuing silver alginate to the wounds as primary dressing 11/26/18 on evaluation today patient appears to be doing quite well in regard to his left lower [place] ulcers. In fact of the areas that were noted initially he only has two regions still open. There is no evidence of active infection at this time. He still is not heard anything from the company regarding lymphedema pumps as of yet. Again as previously seen vascular they have not recommended any surgical intervention. [name], [name] ([id number]) 12/03/2018 on evaluation today patient actually appears to be doing quite well with regard to his lower extremity ulcers. In fact most of the areas appear to be healed the one spot which does not seem to be completely healed I am unsure of whether or not this is really draining that much but nonetheless there does not appear to be any signs  of infection or significant drainage at this point. There is no sign of fever, chills, nausea, vomiting, or diarrhea. Overall I am pleased with how things have progressed I think is very close to being able to transition to his home compression stockings. 12/10/2018 upon evaluation today patient appears to be doing  quite well with regard to his left lower extremity. He has been tolerating the dressing changes without complication. Fortunately there is no signs of active infection at this time. He appears after thorough evaluation of his leg to only have 1 small area that remains open at this point everything else appears to be almost completely closed. He still have significant swelling of the left lower extremity. We had discussed discussing this with his primary care provider he is not able to see her in person they were at the [facility] and right now the [place] is not seeing patients on site. According to the patient anyway. Subsequently he did speak with her apparently and his primary care provider feels that he may likely have a DVT. With that being said she has not seen his leg she is just going off of his history. Nonetheless that is a concern that the patient now has as well and while I do not feel the DVT is likely we can definitely ensure that that is not the case I will go ahead and see about putting that order in today. Nonetheless otherwise I am in a recommend that we continue with the current wound care measures including the compression therapy most likely. We just need to ensure that his leg is indeed free of any DVTs. 12/17/2018 on evaluation today patient actually appears to be completely healed today. He does have 2 very small areas of blistering although this is not anything too significant at this point which is good news. With that being said I am in agreement with the fact that I think he is completely healed at this point. He does want to get back into his compression stocking. The good news is we have gotten approval from insurance for his lymphedema pumps we received a letter since last saw him last week. The other good news is his study did come back and showed no evidence of a DVT. 12/20/2018 on evaluation today patient presents for follow-up concerning his ongoing issues with his left  lower extremity. He was actually discharged last Friday and did fairly well until he states blisters opened this morning. He tells me he has been wearing his compression stocking although he has a hard time getting this on. There does not appear to be any signs of active infection at this time. No fevers, chills, nausea, vomiting, or diarrhea. 12/27/2018 on evaluation today patient appears to be doing very well with regard to his swelling of the left lower extremity the 4 layer compression wrap seems to have been beneficial for him. Fortunately there is no signs of active infection at this time. Patient has been tolerating the compression wrap without complication and his foot swelling in particular appears to be greatly improved. He does still have a wound on the lateral portion of his left leg I believe this is more of a blister that has now reopened. 01/03/2019 on evaluation today patient actually appears to be doing excellent in regard to his left lower extremity. He did receive his compression pumps and is actually use this 7 times since he was last here in the office. On top of the compression wrap he is now roughly  3 cm better at the calf and 2 cm better at the ankle he also states that his foot seem to go an issue better without even having to use a shoe horn. Obviously I think this is all evidence that he is doing excellent in this regard. The other good news is he does not appear to have anything open today as far as wounds are concerned. 01/15/2019 on evaluation today patient appears to be doing more poorly yet again with regard to his left lower extremity. He has developed new wounds again after being discharged just recently. Unfortunately this continues to be the case that he will heal and then have subsequent new wounds. The last time I was hopeful that he may not end up coming back too quickly especially since he states he has been using his lymphedema pumps along with wearing his  compression. Nonetheless he had a blister on the back of his leg that popped up on the left and this has opened up into an ulceration it is quite painful. 01/22/19 on evaluation today patient actually appears to be doing well with regard to his wound on the left lower extremity. He's been tolerating the dressing changes without complication including the compression wrap in the wound appears to be significantly smaller today which is great news. Overall very pleased in this regard. 01/29/2019 on evaluation today patient appears to be doing well with regard to his left posterior lower extremity ulcer. He has been tolerating the dressing changes without complication. This is not completely healed but is getting much closer. We did order a Farrow wrap 4000 for him he has received this and has it with him today although I am not sure we are quite ready to start him on that as of yet. We are very close. 02/05/2019 on evaluation today patient actually appears to be doing quite well with regard to his left posterior lower extremity ulcer. He still has a very tiny opening remaining but the fortunate thing is he seems to be healing quite nicely. He also did get his Farrow wrap which I am hoping will help with his edema control as well at home. Fortunately there is no evidence of active infection. 02/12/2019 patient and fortunately appears to be doing poorly in regard to his wounds of the left lower extremity. He was very close to healing therefore we attempted to use his Velcro compression wraps continuing with lymphedema pumps at home. Unfortunately that does not seem to have done very well for him. He tells me that he wore them all the time but again I am not sure why if that is the case that he is having such significant edema. He is still on his fluid pills as well. With that being said there is no obvious sign of infection although I do wonder about the possibility of infection at this time as  well. 02/19/2019 unfortunately upon evaluation today patient appears to be doing more poorly with regard to his left lower extremity. He is not showing signs of significant improvement and I think the biggest issue here is that he does have an infection that appears to likely be Pseudomonas. That is based on the blue-green drainage that were noted at this time. Unfortunately the antibiotic that has been on is not going to take care of this at all. I think they will get a need to switch him to either Levaquin or Cipro and this was discussed with the patient. 02/26/2019 on evaluation today patient's lower extremity  on the left appears to be doing significantly better as compared to last evaluation. Fortunately there is no signs of active infection at this time. He has been tolerating the compression wrap without complication in fact he made it the whole week at this point. He is showing signs of excellent improvement I am very happy in this regard. With that being said he is having some issues with infection we did review the results of his culture which I noted today. He did have a positive finding for Enterobacter as well as Alcaligenes faecalis. Fortunately the Levaquin that I placed him on will work for both which is great news. There is no signs of systemic infection at this point. 10/30; left posterior leg wound in the setting of very significant edema and what looks like chronic venous inflammation. He has compression pumps but does not use them. We have been using 3 layer compression. Silver alginate to the wound as the primary dressing 03/18/2019 on evaluation today patient appears to be doing a little better compared to last time I saw him. He really has not been using his compression pumps he tells me that he is having too much discomfort. He has been keeping his wraps on however. He is only been taking his fluid pills every other day because he states they are not really helping and he has an  appointment with his primary care provider at the [facility] tomorrow. Subsequently the wound itself on the left lower extremity does seem to be greatly improved compared to previous. [name], [name] ([id number]) 03/25/2019 on evaluation today patient appears to be doing better with regard to his wounds on the bilateral lower extremities. The left is doing excellent the right is also doing better although both still do show some signs of open wounds noted at this point unfortunately. Fortunately there is no signs of active infection at this time. The patient also is not really having any significant pain which is good news. Unfortunately there was some confusion with the referral on vascular disease and as far as getting the patient scheduled there can be contacting him later today to do this fortunately we got this straightened out. 04/01/2019 on evaluation today patient appears to be doing no fevers, chills, nausea, vomiting, or diarrhea. Excellent at this time with regard to his lower extremities. There does not appear to be any open wound at this point which is good news. Fortunately is also no signs of active infection at this time. Overall feel like the patient has done excellent with the compression the problem is every time we got him to this point and then subsequently go to using his own compression things just go right back to where they were. I am not sure how to address this we can try to get an appointment with vascular for 2 weeks now they have yet to call him. Obviously this has become frustrating for the patient as well. I think the issue has just been an honest error as far as scheduling is concerned but nonetheless still worn out the point where I am unsure of which direction we should take. 04/08/2019 on evaluation today patient actually appears to be doing well with regard to his lower extremities. There are no open wounds at this time and things seem to be managing quite nicely as far as  the overall edema control is concerned. With that being said he does have his compression socks today for [place] to go ahead and reinitiate therapy in that manner  at this point. He is going to be going for shoes to be measured on Wednesday and then coincidentally he will also be seeing vascular on Thursday. Overall I think this is good news and again I am hopeful that they will be able to do something for him to help prevent ongoing issues with edema control as well. No fevers, chills, nausea, vomiting, or diarrhea. 04/11/2019 on evaluation today patient actually appears to be doing poorly after just being discharged on Monday of this week. He had been experiencing issues with again blisters especially on the left lower extremity. With that being said he was completely healed and appeared to be doing great this past Monday. He then subsequently has new blisters that formed before his appointment with vascular this morning. He was also measured for shoes in the interim. With that being said we may have figured out what exactly is going on and why he continues to have issues like what we are seeing at this point. He takes his compression stockings off at nighttime and then he ends up having to sleep in his chair for 5-6 hours a night. He sleeps with his feet down he cannot really get him up in the recliner and therefore he is sleeping and the worst possible his position with his feet on the floor for that majority of the time. Again as I explained to him that is about one third at minimum at least one fourth of his day that he spending with his feet dangling down on the ground and the worst possible position they could be. I think this may be what is causing the issue. Subsequently I am leaning toward thinking that he may need a hospital bed in order to elevate his legs. We likely can have to coordinate this with his primary care provider at the [facility]. Readmission: 01/26/2021 this is a patient who  presents for repeat evaluation here in the clinic although it is actually been couple of years since have seen him in fact it was December 2020 when I last saw him. Subsequently he never really healed but did end up being lost to follow-up. He tells me has been having issues ongoing with his lower extremities has bilateral lower extremity lymphedema no real significant or definitive open wounds but in general his lymphedema is way out of control. We were never able to refer him to lymphedema clinic simply due to the fact to be honest we were never able to get him completely healed. I do not see anyone with open wounds. The patient does have evidence of type 2 diabetes mellitus, lymphedema, chronic venous insufficiency, and hypertension. That really has not changed since his last evaluation. 02/09/2021 upon evaluation today patient appears to be doing a little better in regard to his legs although he still having a tremendous amount of drainage especially on the left leg. Fortunately there does not appear to be any evidence of active infection. Of note when we looked into this further it appears that the patient did not have any absorptive dressing on it was just the 4-layer compression wrap. Nonetheless this is probably big part of the issue here. 10/10; he comes in today with 3 large areas on the upper right lower leg likely remanence of denuded blistering under his compression wraps. He has no other wounds on the right. On the left he has the denuded area on the left medial foot and ankle and on the left dorsal foot. Massive lymphedema in both feet  dorsally. Using Zetuvit under compression We have increased home health visitation to twice a week to change the dressings and will change it once 02/22/2021 upon evaluation today patient appears to be doing well currently with regard to his wounds. He has been tolerating the dressing changes without complication. Fortunately there does not appear to be  any evidence of active infection which is great news. No fevers, chills, nausea, vomiting, or diarrhea. The biggest issue I see currently is that home health is not putting any medicine on the actual wounds before wrapping. 03/01/2021 upon evaluation today the patient's right leg actually appears to be doing quite well which is great news there does not appear to be any evidence of active infection at this time. No fevers, chills, nausea, vomiting, or diarrhea. With that being said the patient is having issues on the left foot where he is having significant drainage is also an ammonia smell he does not have any animals at home and this makes me concerned about a bacteria producing urea as a byproduct. Again the possible common organisms will be E. coli, Proteus, and Enterococcus. All 3 of which can be successfully treated with Levaquin. For that reason I think that this may be a good option for [place] to consider placing him on and I did obtain a culture as well for confirmation sake. 03/08/2021 upon evaluation today patient appears to be doing unfortunately still somewhat poorly in regard to his leg ulcerations. He actually has an area on the right leg where he blistered due to the fact that his wrap slid down and caused an area of pinching on his skin and this has led to a significant issue here. 03/15/2021 upon evaluation today patient unfortunately has not been wrapped appropriately with absorptive dressings nor with the appropriate technique for the third layer of the 4-layer compression wrap. These are issues that we continue to try to address with the home health nurse. Also the absorptive dressing that she had was cut in half and therefore that causes things to leak out it does not actually trap the fluid in regard to the top of the foot overall I think that all these combined are really not seeing things improved significantly here. Fortunately there does not appear to be any signs of significant  infection at this time which is good news. He still is having a tremendous amount of drainage. 03/22/2021 upon evaluation today patient appears to be draining tremendously. He still continues to tell me that he is using his pumps 2 times a day and that coupled with that tells me that he is elevating his legs as well. With that being said all things considered I am really just not seeing the improvement we would expect to see with the 4-layer compression wrap and all the above noted. He in fact had an extremely large Zetuvit dressing on both legs and that they were extremely filled to the max with fluid. This is after just being changed just before the weekend and this [name], [name] ([id number]) is Monday. Nonetheless I am concerned about the fact that there is something going on fairly significant that we cannot get any of this under control and that he is draining this significantly. He supposed be having an echocardiogram it sounds like scheduling has been an issue for him as far as getting in sooner. Its something to do with needing his cousin to drive him because of where it sat and he cannot drive himself to this appointment either way  I really think he needs to try to see what he can do about making this happen a little sooner. He tells me he will call today. 03/29/2021 on evaluation today patient appears to be doing about the same in regard to his legs. He did get his cardiology appointment moved up to 6 December which is at least good that is better than what it was before mid December. Overall very pleased in that regard. 04/05/2021 upon evaluation today patient unfortunately is still doing fairly poorly. There does not appear to be any signs of active infection at this time. No fevers, chills, nausea, vomiting, or diarrhea. Unfortunately I think until his edema is under control and overall fluid overload there is really not to be much chance that I can do much to get him better. This is quite  unfortunate and frustrating both for myself and the patient to be perfectly honest. Nonetheless I think that he really needs to have a conversation both with his primary care provider as well as cardiologist he sees the PCP on Monday and cardiology on Wednesday of next week. 04/13/2021 upon evaluation today patient appears to be doing poorly in regard to his bilateral lower extremities his left is still worse than the right. With that being said he has a tremendous amount of drainage he did see his primary care provider yesterday there really was not much there to be done from their perspective. He sees cardiology tomorrow. Nonetheless my biggest concern here is simply that if we do not get the edema under control he is going to continue to have drainage and honestly I think at some point he is going to become infected severely that is my main concern. 04/19/2021 upon evaluation today patient appears to be doing poorly still in regard to his legs. Unfortunately there does not appear to be any signs of infection at this point. He does have a tremendous amount of drainage however. We have not seen the results back from the cardiologist and the echocardiogram that was done. It appears that the patient checked out okay as far as that is concerned with regard to ejection fraction though we still have some issues here to be honest with his diastolic function. I am unsure if this is accounting for everything that we are seeing or not. Either way he has a tremendous amount of drainage from his legs that we are just not able to control in the outpatient setting at this point. I have reached out to Dr. Rockey Situ his cardiologist to see once he reviews the sheet if there is anything that he feels like can be done from an outpatient perspective if not then I think the way to go is probably can to be through inpatient admission and diuresis. Otherwise I am not sure how working to get this under control we tried  antibiotics, compression wrapping, and I have told the patient to be elevating his legs I am not sure how much he does of this but either way I think that this is still an ongoing issue nonetheless. 04/26/2021 upon evaluation today patient appears to be doing poorly in regard to his legs. He is having a tremendous amount of fluid at this point which is quite unfortunate. Its to the point that he may have had at least 5 to 10 pounds of fluid in his dressings this morning when they were removed these were changed this Friday. Subsequently I think he needs to go to the ER for further evaluation and  treatment I think is probably can need diuresis possibly even IV antibiotics been on what the blood work looks like but in general I feel like he needs something to get this under control from an outpatient perspective absent of everywhere I can think of and I cannot get this under control with our traditional measures. I think this is going require more so that we can get him better 12/30; this is a patient with severe bilateral lymphedema. He was hospitalized from 04/26/2021 through 04/29/2021 treated for cellulitis in the setting of lower extremity ulcers and lymphedema. After he left the hospital he is apparently seen for nurse visit our staff contacted cardiology and he has been started on Lasix 40 mg. Apparently his legs have less edema. Lab work from 05/04/2021 showed a BUN of 38 and creatinine of 1.59 these are elevated versus previous where his creatinine seems to have been 1.30 on 12/19 his potassium is 4.3. I believe the lab work is being followed by cardiology We have him in a 4-layer wrap. Xeroform on the leg wounds and sit to fit on the Berry damage skin on the left dorsal foot versus right dorsal foot. He has compression pumps but does not use them. We have apparently not yet ordered him compression stockings 05/17/2021 upon evaluation today patient's legs though better than last time I personally  saw him appear to be getting worse compared to where they were previous. Dr. Quentin Cornwall was actually last 1 to see you I have not seen him since 19 December. That was before he went into the ER. Coming out apparently his legs looked also and they still look better but not as good as they were in the past. 1/16; patient with severe bilateral lymphedema. Severe scaled hyperkeratotic skin on the dorsal aspect of his distal left foot and left medial ankle.. On the right side changes are not as bad. He did not have any weeping edema. Our intake nurse was convinced that he is being compliant with compression pumps 1 hour twice a day 05/31/2021 upon evaluation today patient actually appears to be doing a little bit better in my opinion in regard to his feet. I do not see as much drainage and it being just completely wet as it was previous. Fortunately I do not also see any signs of active infection which is great news as well. 06/07/21 Upon inspection patient's wound bed actually showed signs of doing well he is not nearly as weepy and wet as he has been in the past and overall very pleased in that regard. Fortunately I do not see any signs of active infection locally or nor systemically at this time. Which is great news. No fevers, chills, nausea, vomiting, or diarrhea. 06/14/2021 upon evaluation today patient appears to be doing well with regard to his right foot I am pleased in that regard. His left foot is still draining quite a bit despite using lymphedema pumps, 4-layer compression wraps, and he tells me elevating his legs as well. He also has Lasix that he takes twice a day. Nonetheless I believe that this is still good to be an ongoing issue. We have a hard time getting this under control as far as the swelling is concerned. 06/21/2021 upon evaluation today patient appears to be doing decently well in regard to his wounds all things considered. He still has a tremendous amount of drainage and fluid noted  at this point. Fortunately I do not see any signs of active infection locally or  systemically at this point which is great news. Nonetheless I am unsure where to go and how to do this as far as trying to limit his swelling and weeping from his toes in particular. 06/28/2021 upon evaluation patient unfortunately continues to have significant drainage from his feet. We have been keeping him in a compression wrap and despite this he still continues to have extreme fluid issues he seen his cardiologist he is seeing the nephrologist. We really cannot find any way to get this under control when he did well was when he was in the hospital and they got some of the fluid away. But outside of that we are just struggling to achieve the long-term goal of getting this under control and keeping it under control unfortunately. 07/05/2021 upon evaluation today patient appears to be doing poorly in regard to his feet. Unfortunately this continues to be a significant issue and to be honest I am really not certain what to do about it. I referred him to Dr. Randol Kern at [facility] and he does have an appointment although it is [name], [name] ([id number]) 12 April. He also sees his primary care provider on 6 April. He did not want to see Dr. Haynes Kerns until after he saw his PCP that is the reason the appointment so far out. There is really not much I can do in that regard. Nonetheless I do think that we are still continue to have significant lymphedema issues with significant mount of weeping in regard to the feet and again this has just become extremely difficult to manage to be honest I am not sure if there is something else that Dr. Haynes Kerns or someone else could recommend he also will be seeing Dr. Dellia Nims in 2 weeks when I am on vacation and at that time I will see if Dr. Dellia Nims has any ideas about where to go from here in the meantime. Objective Constitutional Obese and well-hydrated in no acute distress. Vitals Time Taken: 1:53  PM, Height: 73 in, Weight: 312 lbs, BMI: 41.2, Temperature: 98.7 F, Pulse: 59 bpm, Respiratory Rate: 16 breaths/min, Blood Pressure: 145/73 mmHg. Respiratory normal breathing without difficulty. Psychiatric this patient is able to make decisions and demonstrates good insight into disease process. Alert and Oriented x 3. pleasant and cooperative. General Notes: Upon inspection patient continues to have significant weeping of his bilateral lower extremities. This is to the point that to be perfectly honest it is difficult to even keep anything dry changing this 3 times a week as far as home health is concerned. We have literally tried everything that I can think of at the moment and nothing really seems to be working the only time he got better is when he was in the hospital on the diuretics at that time it did slow down tremendously he came out in much better shape but this is slowly creep back up to where it was previous hospital stay. Other Condition(s) Patient presents with Lymphedema located on the Right Leg. Patient presents with Lymphedema located on the Left Leg. 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 Post  procedure Diagnosis Wound #: Same as Pre-Procedure There was a Four Layer Compression Therapy Procedure by Donnamarie Poag, RN. Post procedure Diagnosis Wound #: Same as Pre-Procedure [name], [name] ([id number]) Plan Follow-up Appointments: Return Appointment in 1 week. Nurse Visit as needed Other:  - Keep appt at [facility] with PCP 08/12/21 Keep appt with [facility] Wound center Dr. Haynes Kerns 08/18/21 Home Health: [place]: - [facility]-see dressing changes notes on powder between toes-- [facility] for wound care. May utilize formulary equivalent dressing for wound treatment orders unless otherwise specified. Home Health Nurse may visit PRN to address patient s wound care needs. - frequency 3 times per week - patient will be seen once at wound center - home health to see patient 2 times per week Scheduled days for dressing changes to be completed; exception, patient has scheduled wound care visit that day. **Please direct any NON-WOUND related issues/requests for orders to patient's Primary Care Physician. **If current dressing causes regression in wound condition, may D/C ordered dressing product/s and apply Normal Saline Moist Dressing daily until next [place] or Other MD appointment. **Notify Wound Healing Center of regression in wound condition at [phone]. Bathing/ Shower/ Hygiene: May shower with wound dressing protected with water repellent cover or cast protector. No tub bath. Edema Control - Lymphedema / Segmental Compressive Device / Other: Optional: One layer of unna paste to top of compression wrap (to act as an anchor). - Unna paste on calf to secure wrap in place as needed 4 Layer Compression System Lymphedema. - ****bilateral legs-total of 3 x per week bi lat, nystatin powder between toes on bilateral toes, 2x2 gauze between toes only needed on left side, apply zetuvits/ or formulary for extra absorbant dressing around ankles and foot area, use ABD to cover toes; 2 times per week with HH and 1 time at wound center Terry, LIGHT TAN SPIRAL. WHITE WITH YELLOW LINE FIGURE 8 , COBAN SPIRAL Elevate, Exercise Daily and Avoid Standing for Long Periods of Time. Elevate legs to the level of the heart and pump ankles as often as possible Elevate leg(s)  parallel to the floor when sitting. Compression Pump: Use compression pump on left lower extremity for 60 minutes, twice daily. - 2 times per day DO YOUR BEST to sleep in the bed at night. DO NOT sleep in your recliner. Long hours of sitting in a recliner leads to swelling of the legs and/or potential wounds on your backside. Other: - Contact prescriber regarding use of diuretics to reduce fluid overload. Off-Loading: Turn and reposition every 2 hours Additional Orders / Instructions: Follow Nutritious Diet and Increase Protein Intake 1. Would recommend currently that we going continue with the wound care measures as before were using nystatin powder along with dry gauze between the toes. 2. Were using Zetuvit dressings over top to try to catch as much drainage as possible. 3. We are also using the 4-layer compression wrap to try to keep edema under control is much as possible. 4. The patient is using his fluid pills that he tells me he is also using his pumps 2 times per day. Again if he is that he is doing all this with still having no significant improvement I am not really sure what else to do to be perfectly honest. We will see patient back for reevaluation in 1 week here in the clinic. If anything worsens or changes patient will contact our office for additional recommendations. Electronic Signature(s) Signed: 07/05/2021 4:20:16 PM By:  Melburn Hake, Meril Dray PA-C Entered By: Worthy Keeler on 07/05/2021 16:20:16 [name] ([id number]) -------------------------------------------------------------------------------- SuperBill Details Patient Name: [name] Date of Service: 07/05/2021 Medical Record Number: [id number] Patient Account Number: [id number] Date of Birth/Sex: 1951-04-08 (71 y.o. M) Treating RN: Donnamarie Poag Primary Care Provider: Felipa Eth Other Clinician: Referring Provider: Felipa Eth Treating Provider/Extender: Skipper Cliche in Treatment: 22 Diagnosis  Coding ICD-10 Codes Code Description E11.622 Type 2 diabetes mellitus with other skin ulcer I89.0 Lymphedema, not elsewhere classified I87.2 Venous insufficiency (chronic) (peripheral) L97.822 Non-pressure chronic ulcer of other part of left lower leg with fat layer exposed L97.512 Non-pressure chronic ulcer of other part of right foot with fat layer exposed I10 Essential (primary) hypertension L97.811 Non-pressure chronic ulcer of other part of right lower leg limited to breakdown of skin Facility Procedures CPT4: Description Modifier Quantity Code 53299242 [id number] BILATERAL: Application of multi-layer venous compression system; leg (below knee), including 1 ankle and foot. Physician Procedures CPT4 Code: 9622297 Description: 98921 - WC PHYS LEVEL 4 - EST PT Modifier: Quantity: 1 CPT4 Code: Description: ICD-10 Diagnosis Description E11.622 Type 2 diabetes mellitus with other skin ulcer I89.0 Lymphedema, not elsewhere classified I87.2 Venous insufficiency (chronic) (peripheral) L97.822 Non-pressure chronic ulcer of other part of left lower  leg with fat layer Modifier: exposed Quantity: Electronic Signature(s) Signed: 07/05/2021 4:20:32 PM By: Worthy Keeler PA-C Entered By: Worthy Keeler on 07/05/2021 16:20:32

## 2021-07-05 NOTE — Progress Notes (Signed)
MAYES, SANGIOVANNI ([id number]) Visit Report for 07/05/2021 Arrival Information Details Patient Name: [name], [name] Date of Service: 07/05/2021 1:45 PM Medical Record Number: [id number] Patient Account Number: [id number] Date of Birth/Sex: November 28, 1950 (71 y.o. M) Treating RN: Donnamarie Poag Primary Care Herbie Lehrmann: Felipa Eth Other Clinician: Referring Daeveon Zweber: Felipa Eth Treating Donoven Pett/Extender: Skipper Cliche in Treatment: 6 Visit Information History Since Last Visit Added or deleted any medications: No Patient Arrived: [name] Had a fall or experienced change in No Arrival Time: 13:52 activities of daily living that may affect Accompanied By: self risk of falls: Transfer Assistance: Manual Hospitalized since last visit: No Patient Identification Verified: Yes Has Dressing in Place as Prescribed: Yes Secondary Verification Process Completed: Yes Pain Present Now: No Patient Requires Transmission-Based No Precautions: Patient Has Alerts: Yes Patient Alerts: AVVS consult on file Last ABI-R 1.09; L 1.04 Electronic Signature(s) Signed: 07/05/2021 4:22:58 PM By: Donnamarie Poag Entered By: Donnamarie Poag on 07/05/2021 13:53:04 [name] ([id number]) -------------------------------------------------------------------------------- Compression Therapy Details Patient Name: [name] Date of Service: 07/05/2021 1:45 PM Medical Record Number: [id number] Patient Account Number: [id number] Date of Birth/Sex: 1950/07/21 (71 y.o. M) Treating RN: Donnamarie Poag Primary Care Arly Salminen: Felipa Eth Other Clinician: Referring Izzabell Klasen: Felipa Eth Treating Dazja Houchin/Extender: Skipper Cliche in Treatment: 22 Compression Therapy Performed for Wound Assessment: NonWound Condition Lymphedema - Left Leg Performed By: Junius Argyle, RN Compression Type: Four Layer Post Procedure Diagnosis Same as Pre-procedure Electronic Signature(s) Signed: 07/05/2021 4:22:58 PM By:  Donnamarie Poag Entered By: Donnamarie Poag on 07/05/2021 14:18:51 [name] ([id number]) -------------------------------------------------------------------------------- Compression Therapy Details Patient Name: [name] Date of Service: 07/05/2021 1:45 PM Medical Record Number: [id number] Patient Account Number: [id number] Date of Birth/Sex: 08/02/1950 (71 y.o. M) Treating RN: Donnamarie Poag Primary Care Ivyanna Sibert: Felipa Eth Other Clinician: Referring Traniya Prichett: Felipa Eth Treating Devani Odonnel/Extender: Skipper Cliche in Treatment: 22 Compression Therapy Performed for Wound Assessment: NonWound Condition Lymphedema - Right Leg Performed By: Junius Argyle, RN Compression Type: Four Layer Post Procedure Diagnosis Same as Pre-procedure Electronic Signature(s) Signed: 07/05/2021 4:22:58 PM By: Donnamarie Poag Entered By: Donnamarie Poag on 07/05/2021 14:19:05 [name] ([id number]) -------------------------------------------------------------------------------- Encounter Discharge Information Details Patient Name: [name] Date of Service: 07/05/2021 1:45 PM Medical Record Number: [id number] Patient Account Number: [id number] Date of Birth/Sex: 09-15-1950 (71 y.o. M) Treating RN: Donnamarie Poag Primary Care Daylani Deblois: Felipa Eth Other Clinician: Referring Shenicka Sunderlin: Felipa Eth Treating Cortana Vanderford/Extender: Skipper Cliche in Treatment: 22 Encounter Discharge Information Items Discharge Condition: Stable Ambulatory Status: Cane Discharge Destination: Home Health Telephoned: No Orders Sent: Yes Transportation: Private Auto Accompanied By: self Schedule Follow-up Appointment: Yes Clinical Summary of Care: Electronic Signature(s) Signed: 07/05/2021 4:22:58 PM By: Donnamarie Poag Entered By: Donnamarie Poag on 07/05/2021 14:24:07 [name] ([id number]) -------------------------------------------------------------------------------- Lower Extremity Assessment  Details Patient Name: [name] Date of Service: 07/05/2021 1:45 PM Medical Record Number: [id number] Patient Account Number: [id number] Date of Birth/Sex: [birth year] (71 y.o. M) Treating RN: Donnamarie Poag Primary Care Zhyon Antenucci: Felipa Eth Other Clinician: Referring Lakota Markgraf: Felipa Eth Treating Eboni Coval/Extender: Jeri Cos Weeks in Treatment: 22 Edema Assessment Assessed: [Left: Yes] Patrice Paradise: Yes] Edema: [Left: Yes] [Right: Yes] Calf Left: Right: Point of Measurement: 35 cm From Medial Instep 40 cm 39 cm Ankle Left: Right: Point of Measurement: 10 cm From Medial Instep 35 cm 31 cm Vascular Assessment Pulses: Dorsalis Pedis Palpable: [Left:No Inaudible] [Right:No Inaudible] Notes extreme edema bil lower legs Electronic Signature(s) Signed: 07/05/2021 4:22:58 PM By: Donnamarie Poag Entered By: Donnamarie Poag on 07/05/2021 14:09:05 [name] ([id number]) --------------------------------------------------------------------------------  Multi Wound Chart Details Patient Name: [name], [name] Date of Service: 07/05/2021 1:45 PM Medical Record Number: [id number] Patient Account Number: [id number] Date of Birth/Sex: [birth year] (71 y.o. M) Treating RN: Donnamarie Poag Primary Care Honey Zakarian: Felipa Eth Other Clinician: Referring Dashiell Franchino: Felipa Eth Treating Bradford Cazier/Extender: Skipper Cliche in Treatment: 22 Vital Signs Height(in): 73 Pulse(bpm): 59 Weight(lbs): 312 Blood Pressure(mmHg): 145/73 Body Mass Index(BMI): 41.2 Temperature(F): 98.7 Respiratory Rate(breaths/min): 16 Wound Assessments Treatment Notes Electronic Signature(s) Signed: 07/05/2021 4:22:58 PM By: Donnamarie Poag Entered By: Donnamarie Poag on 07/05/2021 14:09:37 [name] ([id number]) -------------------------------------------------------------------------------- [place] Details Patient Name: [name] Date of Service: 07/05/2021 1:45 PM Medical Record Number:  [id number] Patient Account Number: [id number] Date of Birth/Sex: 30-Oct-1950 (71 y.o. M) Treating RN: Donnamarie Poag Primary Care Baylynn Shifflett: Felipa Eth Other Clinician: Referring Giavonna Pflum: Felipa Eth Treating Aleena Kirkeby/Extender: Jeri Cos Weeks in Treatment: 22 Active Inactive Electronic Signature(s) Signed: 07/05/2021 4:22:58 PM By: Donnamarie Poag Entered By: Donnamarie Poag on 07/05/2021 14:09:15 [name] ([id number]) -------------------------------------------------------------------------------- Non-Wound Condition Assessment Details Patient Name: [name] Date of Service: 07/05/2021 1:45 PM Medical Record Number: [id number] Patient Account Number: [id number] Date of Birth/Sex: 30-Aug-1950 (71 y.o. M) Treating RN: Donnamarie Poag Primary Care Albert Devaul: Felipa Eth Other Clinician: Referring Shyana Kulakowski: Felipa Eth Treating Trayven Lumadue/Extender: Jeri Cos Weeks in Treatment: 22 Non-Wound Condition: Condition: Lymphedema Location: Leg Side: Right Photos Electronic Signature(s) Signed: 07/05/2021 4:22:58 PM By: Donnamarie Poag Entered By: Donnamarie Poag on 07/05/2021 14:07:20 [name] ([id number]) -------------------------------------------------------------------------------- Non-Wound Condition Assessment Details Patient Name: [name] Date of Service: 07/05/2021 1:45 PM Medical Record Number: [id number] Patient Account Number: [id number] Date of Birth/Sex: 1950/12/31 (71 y.o. M) Treating RN: Donnamarie Poag Primary Care Cherish Runde: Felipa Eth Other Clinician: Referring Nadelyn Enriques: Felipa Eth Treating Jakylan Ron/Extender: Jeri Cos Weeks in Treatment: 22 Non-Wound Condition: Condition: Lymphedema Location: Leg Side: Left Notes: Anterior aspect of lower leg Photos Electronic Signature(s) Signed: 07/05/2021 4:22:58 PM By: Donnamarie Poag Entered By: Donnamarie Poag on 07/05/2021 14:07:59 [name]  ([id number]) -------------------------------------------------------------------------------- Pain Assessment Details Patient Name: [name] Date of Service: 07/05/2021 1:45 PM Medical Record Number: [id number] Patient Account Number: [id number] Date of Birth/Sex: 1951-02-12 (71 y.o. M) Treating RN: Donnamarie Poag Primary Care Shantai Tiedeman: Felipa Eth Other Clinician: Referring Kamee Bobst: Felipa Eth Treating Sheldon Sem/Extender: Skipper Cliche in Treatment: 22 Active Problems Location of Pain Severity and Description of Pain Patient Has Paino No Site Locations Rate the pain. Current Pain Level: 0 Pain Management and Medication Current Pain Management: Electronic Signature(s) Signed: 07/05/2021 4:22:58 PM By: Donnamarie Poag Entered By: Donnamarie Poag on 07/05/2021 14:06:13 [name] ([id number]) -------------------------------------------------------------------------------- Patient/Caregiver Education Details Patient Name: [name] Date of Service: 07/05/2021 1:45 PM Medical Record Number: [id number] Patient Account Number: [id number] Date of Birth/Gender: 1950/05/21 (71 y.o. M) Treating RN: Donnamarie Poag Primary Care Physician: Felipa Eth Other Clinician: Referring Physician: Felipa Eth Treating Physician/Extender: Skipper Cliche in Treatment: 22 Education Assessment Education Provided To: Patient Education Topics Provided Wound/Skin Impairment: Electronic Signature(s) Signed: 07/05/2021 4:22:58 PM By: Donnamarie Poag Entered By: Donnamarie Poag on 07/05/2021 14:22:49 [name] ([id number]) -------------------------------------------------------------------------------- [place] Details Patient Name: [name] Date of Service: 07/05/2021 1:45 PM Medical Record Number: [id number] Patient Account Number: [id number] Date of Birth/Sex: July 04, 1950 (71 y.o. M) Treating RN: Donnamarie Poag Primary Care Nels Munn: Felipa Eth Other Clinician: Referring Louvinia Cumbo:  Felipa Eth Treating Alesa Echevarria/Extender: Skipper Cliche in Treatment: 22 Vital Signs Time Taken: 13:53 Temperature (F): 98.7 Height (in): 73 Pulse (bpm): 59 Weight (lbs): 312 Respiratory Rate (breaths/min): 16 Body Mass Index (BMI): 41.2  Blood Pressure (mmHg): 145/73 Reference Range: 80 - 120 mg / dl Electronic Signature(s) Signed: 07/05/2021 4:22:58 PM By: Donnamarie Poag Entered By: Donnamarie Poag on 07/05/2021 13:56:21

## 2021-07-12 ENCOUNTER — Other Ambulatory Visit: Payer: Self-pay

## 2021-07-12 ENCOUNTER — Encounter: Payer: PPO | Attending: Physician Assistant | Admitting: Physician Assistant

## 2021-07-12 DIAGNOSIS — L97512 Non-pressure chronic ulcer of other part of right foot with fat layer exposed: Secondary | ICD-10-CM | POA: Insufficient documentation

## 2021-07-12 DIAGNOSIS — I2510 Atherosclerotic heart disease of native coronary artery without angina pectoris: Secondary | ICD-10-CM | POA: Insufficient documentation

## 2021-07-12 DIAGNOSIS — I890 Lymphedema, not elsewhere classified: Secondary | ICD-10-CM | POA: Diagnosis not present

## 2021-07-12 DIAGNOSIS — L97811 Non-pressure chronic ulcer of other part of right lower leg limited to breakdown of skin: Secondary | ICD-10-CM | POA: Diagnosis not present

## 2021-07-12 DIAGNOSIS — Z856 Personal history of leukemia: Secondary | ICD-10-CM | POA: Insufficient documentation

## 2021-07-12 DIAGNOSIS — I5040 Unspecified combined systolic (congestive) and diastolic (congestive) heart failure: Secondary | ICD-10-CM | POA: Diagnosis not present

## 2021-07-12 DIAGNOSIS — E11622 Type 2 diabetes mellitus with other skin ulcer: Secondary | ICD-10-CM | POA: Insufficient documentation

## 2021-07-12 DIAGNOSIS — L97822 Non-pressure chronic ulcer of other part of left lower leg with fat layer exposed: Secondary | ICD-10-CM | POA: Diagnosis not present

## 2021-07-12 DIAGNOSIS — Z79899 Other long term (current) drug therapy: Secondary | ICD-10-CM | POA: Diagnosis not present

## 2021-07-12 DIAGNOSIS — I110 Hypertensive heart disease with heart failure: Secondary | ICD-10-CM | POA: Insufficient documentation

## 2021-07-12 DIAGNOSIS — E1142 Type 2 diabetes mellitus with diabetic polyneuropathy: Secondary | ICD-10-CM | POA: Diagnosis not present

## 2021-07-12 DIAGNOSIS — I872 Venous insufficiency (chronic) (peripheral): Secondary | ICD-10-CM | POA: Insufficient documentation

## 2021-07-12 NOTE — Progress Notes (Addendum)
UZAIR, GODLEY ([id number]) Visit Report for 07/12/2021 Chief Complaint Document Details Patient Name: ANEL, PUROHIT Date of Service: 07/12/2021 2:00 PM Medical Record Number: [id number] Patient Account Number: [id number] Date of Birth/Sex: July 01, 1950 (71 y.o. M) Treating RN: [name] Primary Care Provider: Felipa Eth Other Clinician: Referring Provider: Felipa Eth Treating Provider/Extender: Skipper Cliche in Treatment: 23 Information Obtained from: Patient Chief Complaint Left LE ulcers Electronic Signature(s) Signed: 07/12/2021 2:18:03 PM By: Worthy Keeler PA-C Entered By: Worthy Keeler on 07/12/2021 14:18:02 [name] ([id number]) -------------------------------------------------------------------------------- HPI Details Patient Name: [name] Date of Service: 07/12/2021 2:00 PM Medical Record Number: [id number] Patient Account Number: [id number] Date of Birth/Sex: 1950-08-14 (71 y.o. M) Treating RN: [name] Primary Care Provider: Felipa Eth Other Clinician: Referring Provider: Felipa Eth Treating Provider/Extender: Skipper Cliche in Treatment: 23 History of Present Illness HPI Description: 71 year old male who presented to the ER with bilateral lower extremity blisters which had started last week. he has a past medical history of leukemia, diabetes mellitus, hypertension, edema of both lower extremities, his recurrent skin infections, peripheral vascular disease, coronary artery disease, congestive heart failure and peripheral neuropathy. in the ER he was given Rocephin and put on Silvadene cream. he was put on oral doxycycline and was asked to follow-up with the [facility]. His last hemoglobin A1c was 6.6 in December and he checks his blood sugar once a week. He does not have any physicians outside the [place] system. He does not recall any vascular duplex studies done either for arterial or venous disease but was told to wear compression  stockings which he does not use 05/30/2016 -- we have not yet received any of his notes from the [facility] system and his arterial and venous duplex studies are scheduled here in [place] around mid February. We are unable to have his insurance accepted by home health agencies and hence he is getting dressings only once a week. 06/06/16 -- -- I received a call from the patient's PCP at the [facility] at [facility] and spoke to Dr. Garvin Fila, phone number [phone] and fax number [phone]. She confirmed that no vascular testing was done over the last 5 years and she would be happy to do them if the patient did want them to be done at the [place] and we could fax him a request. Readmission: 71 year old male seen by as in February of this year and was referred to vein and vascular for studies and opinion from the vascular surgeons. The patient returns today with a fresh problem having had blisters on his left lower extremity which have been there for about 5 days and he clearly states that he has been wearing his compression stockings as advised though he could not read the moderate compression and has been wearing light compression. Review of his electronic medical records note that he had lower extremity arterial duplex examination done on 06/23/2016 which showed no hemodynamically significant stenosis in the bilateral lower extremity arterial system. He also had a lower extremity venous reflux examination done on 07/07/2016 and it was noted that he had venous incompetence in the right great saphenous vein and bilateral common femoral veins. Patient was seen by Dr. Tamala Julian on the same day and for some reason his notes do not reflect the venous studies or the arterial studies and he recommended patient do a venous duplex ultrasound to look for reflux and return to see him.he would also consider a lymph pump if required. The patient was told that his workup  was normal and hence the patient canceled his  follow-up appointment. 02/03/17 on evaluation today patient left medial lower extremity blister appears to be doing about the same. It is still continuing to drain and there's still the blistered skin covering the wound bed which is making it difficult for the alternate to do its job. Fortunately there is no evidence of cellulitis. No fevers chills noted. Patient states in general he is not having any significant discomfort. Patient's lower extremity arterial duplex exam revealed that patient was hemodynamically stable with no evidence of stenosis in regard to the bilateral lower extremities. The lower extremity venous reflux exam revealed the patient had venous incontinence noted in the right greater saphenous and bilateral common femoral vein. There is no evidence of deep or superficial vein thrombosis in the bilateral lower extremities. Readmission: 11/12/18 Patient presents for evaluation our clinic today concerning issues that he is having with his left lower extremity. He tells me that a couple weeks ago he began developing blisters on the left lower extremity along with increased swelling. He typically wears his compression stockings on a regular basis is previously been evaluated both here as well is with vascular surgery they would recommend lymphedema pumps but unfortunately that somehow fell through and he never heard anything back from that. Nonetheless I think lymphedema pumps would be beneficial for this patient. He does have a history of hypertension and diabetes. Obviously the chronic venous stasis and lymphedema as well. At this point the blisters have been given in more trouble he states sometimes when the blisters openings able to clean it down with alcohol and it will dry out and do well. Unfortunately that has not been the case this time. He is having some discomfort although this mean these with cleaning the areas he doesn't have discomfort just on a regular basis. He has not been  able to wear his compression stockings since the blisters arose due to the fact that of course it will drain into the socks causing additional issues and he didn't have any way to wrap this otherwise. He has increased to taking his Lasix every day instead of every other day. He sees his primary care provider later this month as well. No fevers, chills, nausea, or vomiting noted at this time. 11/19/18-Patient returns at 1 week, per intake RN the amount of seepage into the compression wraps was definitely improved, overall all the wounds are measuring smaller but continuing silver alginate to the wounds as primary dressing 11/26/18 on evaluation today patient appears to be doing quite well in regard to his left lower [place] ulcers. In fact of the areas that were noted initially he only has two regions still open. There is no evidence of active infection at this time. He still is not heard anything from the company regarding lymphedema pumps as of yet. Again as previously seen vascular they have not recommended any surgical intervention. 12/03/2018 on evaluation today patient actually appears to be doing quite well with regard to his lower extremity ulcers. In fact most of the areas appear to be healed the one spot which does not seem to be completely healed I am unsure of whether or not this is really draining that much but nonetheless there does not appear to be any signs of infection or significant drainage at this point. There is no sign of fever, chills, nausea, vomiting, or diarrhea. Overall I am pleased with how things have progressed I think is very close to being able to transition  to his home compression stockings. MANASSEH, PITTSLEY ([id number]) 12/10/2018 upon evaluation today patient appears to be doing quite well with regard to his left lower extremity. He has been tolerating the dressing changes without complication. Fortunately there is no signs of active infection at this time. He appears after  thorough evaluation of his leg to only have 1 small area that remains open at this point everything else appears to be almost completely closed. He still have significant swelling of the left lower extremity. We had discussed discussing this with his primary care provider he is not able to see her in person they were at the [facility] and right now the [place] is not seeing patients on site. According to the patient anyway. Subsequently he did speak with her apparently and his primary care provider feels that he may likely have a DVT. With that being said she has not seen his leg she is just going off of his history. Nonetheless that is a concern that the patient now has as well and while I do not feel the DVT is likely we can definitely ensure that that is not the case I will go ahead and see about putting that order in today. Nonetheless otherwise I am in a recommend that we continue with the current wound care measures including the compression therapy most likely. We just need to ensure that his leg is indeed free of any DVTs. 12/17/2018 on evaluation today patient actually appears to be completely healed today. He does have 2 very small areas of blistering although this is not anything too significant at this point which is good news. With that being said I am in agreement with the fact that I think he is completely healed at this point. He does want to get back into his compression stocking. The good news is we have gotten approval from insurance for his lymphedema pumps we received a letter since last saw him last week. The other good news is his study did come back and showed no evidence of a DVT. 12/20/2018 on evaluation today patient presents for follow-up concerning his ongoing issues with his left lower extremity. He was actually discharged last Friday and did fairly well until he states blisters opened this morning. He tells me he has been wearing his compression stocking although he has a hard  time getting this on. There does not appear to be any signs of active infection at this time. No fevers, chills, nausea, vomiting, or diarrhea. 12/27/2018 on evaluation today patient appears to be doing very well with regard to his swelling of the left lower extremity the 4 layer compression wrap seems to have been beneficial for him. Fortunately there is no signs of active infection at this time. Patient has been tolerating the compression wrap without complication and his foot swelling in particular appears to be greatly improved. He does still have a wound on the lateral portion of his left leg I believe this is more of a blister that has now reopened. 01/03/2019 on evaluation today patient actually appears to be doing excellent in regard to his left lower extremity. He did receive his compression pumps and is actually use this 7 times since he was last here in the office. On top of the compression wrap he is now roughly 3 cm better at the calf and 2 cm better at the ankle he also states that his foot seem to go an issue better without even having to use a shoe horn. Obviously I  think this is all evidence that he is doing excellent in this regard. The other good news is he does not appear to have anything open today as far as wounds are concerned. 01/15/2019 on evaluation today patient appears to be doing more poorly yet again with regard to his left lower extremity. He has developed new wounds again after being discharged just recently. Unfortunately this continues to be the case that he will heal and then have subsequent new wounds. The last time I was hopeful that he may not end up coming back too quickly especially since he states he has been using his lymphedema pumps along with wearing his compression. Nonetheless he had a blister on the back of his leg that popped up on the left and this has opened up into an ulceration it is quite painful. 01/22/19 on evaluation today patient actually appears  to be doing well with regard to his wound on the left lower extremity. He's been tolerating the dressing changes without complication including the compression wrap in the wound appears to be significantly smaller today which is great news. Overall very pleased in this regard. 01/29/2019 on evaluation today patient appears to be doing well with regard to his left posterior lower extremity ulcer. He has been tolerating the dressing changes without complication. This is not completely healed but is getting much closer. We did order a Farrow wrap 4000 for him he has received this and has it with him today although I am not sure we are quite ready to start him on that as of yet. We are very close. 02/05/2019 on evaluation today patient actually appears to be doing quite well with regard to his left posterior lower extremity ulcer. He still has a very tiny opening remaining but the fortunate thing is he seems to be healing quite nicely. He also did get his Farrow wrap which I am hoping will help with his edema control as well at home. Fortunately there is no evidence of active infection. 02/12/2019 patient and fortunately appears to be doing poorly in regard to his wounds of the left lower extremity. He was very close to healing therefore we attempted to use his Velcro compression wraps continuing with lymphedema pumps at home. Unfortunately that does not seem to have done very well for him. He tells me that he wore them all the time but again I am not sure why if that is the case that he is having such significant edema. He is still on his fluid pills as well. With that being said there is no obvious sign of infection although I do wonder about the possibility of infection at this time as well. 02/19/2019 unfortunately upon evaluation today patient appears to be doing more poorly with regard to his left lower extremity. He is not showing signs of significant improvement and I think the biggest issue here is  that he does have an infection that appears to likely be Pseudomonas. That is based on the blue-green drainage that were noted at this time. Unfortunately the antibiotic that has been on is not going to take care of this at all. I think they will get a need to switch him to either Levaquin or Cipro and this was discussed with the patient. 02/26/2019 on evaluation today patient's lower extremity on the left appears to be doing significantly better as compared to last evaluation. Fortunately there is no signs of active infection at this time. He has been tolerating the compression wrap without complication in  fact he made it the whole week at this point. He is showing signs of excellent improvement I am very happy in this regard. With that being said he is having some issues with infection we did review the results of his culture which I noted today. He did have a positive finding for Enterobacter as well as Alcaligenes faecalis. Fortunately the Levaquin that I placed him on will work for both which is great news. There is no signs of systemic infection at this point. 10/30; left posterior leg wound in the setting of very significant edema and what looks like chronic venous inflammation. He has compression pumps but does not use them. We have been using 3 layer compression. Silver alginate to the wound as the primary dressing 03/18/2019 on evaluation today patient appears to be doing a little better compared to last time I saw him. He really has not been using his compression pumps he tells me that he is having too much discomfort. He has been keeping his wraps on however. He is only been taking his fluid pills every other day because he states they are not really helping and he has an appointment with his primary care provider at the [facility] tomorrow. Subsequently the wound itself on the left lower extremity does seem to be greatly improved compared to previous. 03/25/2019 on evaluation today patient appears  to be doing better with regard to his wounds on the bilateral lower extremities. The left is doing excellent the right is also doing better although both still do show some signs of open wounds noted at this point unfortunately. Fortunately there is no signs of active infection at this time. The patient also is not really having any significant pain which is good news. Unfortunately there was some confusion with the referral on vascular disease and as far as getting the patient scheduled there can be contacting him later today to do this ELIJAN, GOOGE ([id number]) fortunately we got this straightened out. 04/01/2019 on evaluation today patient appears to be doing no fevers, chills, nausea, vomiting, or diarrhea. Excellent at this time with regard to his lower extremities. There does not appear to be any open wound at this point which is good news. Fortunately is also no signs of active infection at this time. Overall feel like the patient has done excellent with the compression the problem is every time we got him to this point and then subsequently go to using his own compression things just go right back to where they were. I am not sure how to address this we can try to get an appointment with vascular for 2 weeks now they have yet to call him. Obviously this has become frustrating for the patient as well. I think the issue has just been an honest error as far as scheduling is concerned but nonetheless still worn out the point where I am unsure of which direction we should take. 04/08/2019 on evaluation today patient actually appears to be doing well with regard to his lower extremities. There are no open wounds at this time and things seem to be managing quite nicely as far as the overall edema control is concerned. With that being said he does have his compression socks today for [place] to go ahead and reinitiate therapy in that manner at this point. He is going to be going for shoes to be  measured on Wednesday and then coincidentally he will also be seeing vascular on Thursday. Overall I think this is good news and  again I am hopeful that they will be able to do something for him to help prevent ongoing issues with edema control as well. No fevers, chills, nausea, vomiting, or diarrhea. 04/11/2019 on evaluation today patient actually appears to be doing poorly after just being discharged on Monday of this week. He had been experiencing issues with again blisters especially on the left lower extremity. With that being said he was completely healed and appeared to be doing great this past Monday. He then subsequently has new blisters that formed before his appointment with vascular this morning. He was also measured for shoes in the interim. With that being said we may have figured out what exactly is going on and why he continues to have issues like what we are seeing at this point. He takes his compression stockings off at nighttime and then he ends up having to sleep in his chair for 5-6 hours a night. He sleeps with his feet down he cannot really get him up in the recliner and therefore he is sleeping and the worst possible his position with his feet on the floor for that majority of the time. Again as I explained to him that is about one third at minimum at least one fourth of his day that he spending with his feet dangling down on the ground and the worst possible position they could be. I think this may be what is causing the issue. Subsequently I am leaning toward thinking that he may need a hospital bed in order to elevate his legs. We likely can have to coordinate this with his primary care provider at the [facility]. Readmission: 01/26/2021 this is a patient who presents for repeat evaluation here in the clinic although it is actually been couple of years since have seen him in fact it was December 2020 when I last saw him. Subsequently he never really healed but did end up  being lost to follow-up. He tells me has been having issues ongoing with his lower extremities has bilateral lower extremity lymphedema no real significant or definitive open wounds but in general his lymphedema is way out of control. We were never able to refer him to lymphedema clinic simply due to the fact to be honest we were never able to get him completely healed. I do not see anyone with open wounds. The patient does have evidence of type 2 diabetes mellitus, lymphedema, chronic venous insufficiency, and hypertension. That really has not changed since his last evaluation. 02/09/2021 upon evaluation today patient appears to be doing a little better in regard to his legs although he still having a tremendous amount of drainage especially on the left leg. Fortunately there does not appear to be any evidence of active infection. Of note when we looked into this further it appears that the patient did not have any absorptive dressing on it was just the 4-layer compression wrap. Nonetheless this is probably big part of the issue here. 10/10; he comes in today with 3 large areas on the upper right lower leg likely remanence of denuded blistering under his compression wraps. He has no other wounds on the right. On the left he has the denuded area on the left medial foot and ankle and on the left dorsal foot. Massive lymphedema in both feet dorsally. Using Zetuvit under compression We have increased home health visitation to twice a week to change the dressings and will change it once 02/22/2021 upon evaluation today patient appears to be doing well currently  with regard to his wounds. He has been tolerating the dressing changes without complication. Fortunately there does not appear to be any evidence of active infection which is great news. No fevers, chills, nausea, vomiting, or diarrhea. The biggest issue I see currently is that home health is not putting any medicine on the actual wounds  before wrapping. 03/01/2021 upon evaluation today the patient's right leg actually appears to be doing quite well which is great news there does not appear to be any evidence of active infection at this time. No fevers, chills, nausea, vomiting, or diarrhea. With that being said the patient is having issues on the left foot where he is having significant drainage is also an ammonia smell he does not have any animals at home and this makes me concerned about a bacteria producing urea as a byproduct. Again the possible common organisms will be E. coli, Proteus, and Enterococcus. All 3 of which can be successfully treated with Levaquin. For that reason I think that this may be a good option for [place] to consider placing him on and I did obtain a culture as well for confirmation sake. 03/08/2021 upon evaluation today patient appears to be doing unfortunately still somewhat poorly in regard to his leg ulcerations. He actually has an area on the right leg where he blistered due to the fact that his wrap slid down and caused an area of pinching on his skin and this has led to a significant issue here. 03/15/2021 upon evaluation today patient unfortunately has not been wrapped appropriately with absorptive dressings nor with the appropriate technique for the third layer of the 4-layer compression wrap. These are issues that we continue to try to address with the home health nurse. Also the absorptive dressing that she had was cut in half and therefore that causes things to leak out it does not actually trap the fluid in regard to the top of the foot overall I think that all these combined are really not seeing things improved significantly here. Fortunately there does not appear to be any signs of significant infection at this time which is good news. He still is having a tremendous amount of drainage. 03/22/2021 upon evaluation today patient appears to be draining tremendously. He still continues to tell me  that he is using his pumps 2 times a day and that coupled with that tells me that he is elevating his legs as well. With that being said all things considered I am really just not seeing the improvement we would expect to see with the 4-layer compression wrap and all the above noted. He in fact had an extremely large Zetuvit dressing on both legs and that they were extremely filled to the max with fluid. This is after just being changed just before the weekend and this is Monday. Nonetheless I am concerned about the fact that there is something going on fairly significant that we cannot get any of this under control and that he is draining this significantly. He supposed be having an echocardiogram it sounds like scheduling has been an issue for him as far as getting in sooner. Its something to do with needing his cousin to drive him because of where it sat and he cannot drive himself to this appointment either way I really think he needs to try to see what he can do about making this happen a little sooner. He tells me he will call today. SHIHEEM, CORPORAN ([id number]) 03/29/2021 on evaluation today patient appears to be  doing about the same in regard to his legs. He did get his cardiology appointment moved up to 6 December which is at least good that is better than what it was before mid December. Overall very pleased in that regard. 04/05/2021 upon evaluation today patient unfortunately is still doing fairly poorly. There does not appear to be any signs of active infection at this time. No fevers, chills, nausea, vomiting, or diarrhea. Unfortunately I think until his edema is under control and overall fluid overload there is really not to be much chance that I can do much to get him better. This is quite unfortunate and frustrating both for myself and the patient to be perfectly honest. Nonetheless I think that he really needs to have a conversation both with his primary care provider as well as  cardiologist he sees the PCP on Monday and cardiology on Wednesday of next week. 04/13/2021 upon evaluation today patient appears to be doing poorly in regard to his bilateral lower extremities his left is still worse than the right. With that being said he has a tremendous amount of drainage he did see his primary care provider yesterday there really was not much there to be done from their perspective. He sees cardiology tomorrow. Nonetheless my biggest concern here is simply that if we do not get the edema under control he is going to continue to have drainage and honestly I think at some point he is going to become infected severely that is my main concern. 04/19/2021 upon evaluation today patient appears to be doing poorly still in regard to his legs. Unfortunately there does not appear to be any signs of infection at this point. He does have a tremendous amount of drainage however. We have not seen the results back from the cardiologist and the echocardiogram that was done. It appears that the patient checked out okay as far as that is concerned with regard to ejection fraction though we still have some issues here to be honest with his diastolic function. I am unsure if this is accounting for everything that we are seeing or not. Either way he has a tremendous amount of drainage from his legs that we are just not able to control in the outpatient setting at this point. I have reached out to Dr. Rockey Situ his cardiologist to see once he reviews the sheet if there is anything that he feels like can be done from an outpatient perspective if not then I think the way to go is probably can to be through inpatient admission and diuresis. Otherwise I am not sure how working to get this under control we tried antibiotics, compression wrapping, and I have told the patient to be elevating his legs I am not sure how much he does of this but either way I think that this is still an ongoing issue  nonetheless. 04/26/2021 upon evaluation today patient appears to be doing poorly in regard to his legs. He is having a tremendous amount of fluid at this point which is quite unfortunate. Its to the point that he may have had at least 5 to 10 pounds of fluid in his dressings this morning when they were removed these were changed this Friday. Subsequently I think he needs to go to the ER for further evaluation and treatment I think is probably can need diuresis possibly even IV antibiotics been on what the blood work looks like but in general I feel like he needs something to get this under control from  an outpatient perspective absent of everywhere I can think of and I cannot get this under control with our traditional measures. I think this is going require more so that we can get him better 12/30; this is a patient with severe bilateral lymphedema. He was hospitalized from 04/26/2021 through 04/29/2021 treated for cellulitis in the setting of lower extremity ulcers and lymphedema. After he left the hospital he is apparently seen for nurse visit our staff contacted cardiology and he has been started on Lasix 40 mg. Apparently his legs have less edema. Lab work from 05/04/2021 showed a BUN of 38 and creatinine of 1.59 these are elevated versus previous where his creatinine seems to have been 1.30 on 12/19 his potassium is 4.3. I believe the lab work is being followed by cardiology We have him in a 4-layer wrap. Xeroform on the leg wounds and sit to fit on the Berry damage skin on the left dorsal foot versus right dorsal foot. He has compression pumps but does not use them. We have apparently not yet ordered him compression stockings 05/17/2021 upon evaluation today patient's legs though better than last time I personally saw him appear to be getting worse compared to where they were previous. Dr. Quentin Cornwall was actually last 1 to see you I have not seen him since 19 December. That was before he went into  the ER. Coming out apparently his legs looked also and they still look better but not as good as they were in the past. 1/16; patient with severe bilateral lymphedema. Severe scaled hyperkeratotic skin on the dorsal aspect of his distal left foot and left medial ankle.. On the right side changes are not as bad. He did not have any weeping edema. Our intake nurse was convinced that he is being compliant with compression pumps 1 hour twice a day 05/31/2021 upon evaluation today patient actually appears to be doing a little bit better in my opinion in regard to his feet. I do not see as much drainage and it being just completely wet as it was previous. Fortunately I do not also see any signs of active infection which is great news as well. 06/07/21 Upon inspection patient's wound bed actually showed signs of doing well he is not nearly as weepy and wet as he has been in the past and overall very pleased in that regard. Fortunately I do not see any signs of active infection locally or nor systemically at this time. Which is great news. No fevers, chills, nausea, vomiting, or diarrhea. 06/14/2021 upon evaluation today patient appears to be doing well with regard to his right foot I am pleased in that regard. His left foot is still draining quite a bit despite using lymphedema pumps, 4-layer compression wraps, and he tells me elevating his legs as well. He also has Lasix that he takes twice a day. Nonetheless I believe that this is still good to be an ongoing issue. We have a hard time getting this under control as far as the swelling is concerned. 06/21/2021 upon evaluation today patient appears to be doing decently well in regard to his wounds all things considered. He still has a tremendous amount of drainage and fluid noted at this point. Fortunately I do not see any signs of active infection locally or systemically at this point which is great news. Nonetheless I am unsure where to go and how to do this  as far as trying to limit his swelling and weeping from his toes in particular.  06/28/2021 upon evaluation patient unfortunately continues to have significant drainage from his feet. We have been keeping him in a compression wrap and despite this he still continues to have extreme fluid issues he seen his cardiologist he is seeing the nephrologist. We really cannot find any way to get this under control when he did well was when he was in the hospital and they got some of the fluid away. But outside of that we are just struggling to achieve the long-term goal of getting this under control and keeping it under control unfortunately. 07/05/2021 upon evaluation today patient appears to be doing poorly in regard to his feet. Unfortunately this continues to be a significant issue and to be honest I am really not certain what to do about it. I referred him to Dr. Randol Kern at [facility] and he does have an appointment although it is 12 April. He also sees his primary care provider on 6 April. He did not want to see Dr. Haynes Kerns until after he saw his PCP that is the reason the appointment so far out. There is really not much I can do in that regard. Nonetheless I do think that we are still continue to have significant lymphedema issues with significant mount of weeping in regard to the feet and again this has just become extremely difficult to manage to be honest I am not sure if there is something else that Dr. Haynes Kerns or someone else could recommend he also will be seeing Dr. Dellia Nims in 2 weeks when I am on vacation and at that time I will see if Dr. Dellia Nims has any ideas about where to go from here in the meantime. ROEMELLO, SPEYER ([id number]) 07/12/2021 upon evaluation today patient appears to be doing about as well as can be expected with regard to his feet. He does actually see his kidney doctor this Friday. He also will be seeing his primary care provider on April 4 and then following that around mid April he will be  seeing Dr. Haynes Kerns at [facility] which was a referral made for him. Again my goal is to try to find out some way to fix this and to be perfectly honest we have had some issues with making any good adjustments. When he was in the hospital and greater amounts of Lasix he was able to get this down and it looked much better upon discharge. With that being said right now things just are not doing nearly as good as what they used to be. Electronic Signature(s) Signed: 07/12/2021 2:45:32 PM By: Worthy Keeler PA-C Entered By: Worthy Keeler on 07/12/2021 14:45:32 [name] ([id number]) -------------------------------------------------------------------------------- Physical Exam Details Patient Name: [name] Date of Service: 07/12/2021 2:00 PM Medical Record Number: [id number] Patient Account Number: [id number] Date of Birth/Sex: [birth year] (71 y.o. M) Treating RN: [name] Primary Care Provider: Felipa Eth Other Clinician: Referring Provider: Felipa Eth Treating Provider/Extender: Skipper Cliche in Treatment: 17 Constitutional Well-nourished and well-hydrated in no acute distress. Respiratory normal breathing without difficulty. Psychiatric this patient is able to make decisions and demonstrates good insight into disease process. Alert and Oriented x 3. pleasant and cooperative. Notes Upon inspection patient's wounds again are showing signs still of having quite a bit of drainage. Fortunately there does not appear to be any signs of active infection at this time which is great news. With that being said I still think that this is something that is going to continue to be an issue if we cannot get his  fluid levels down. He is on 40 of Lasix 1 time per day. This just may not be enough. Electronic Signature(s) Signed: 07/12/2021 2:48:18 PM By: Worthy Keeler PA-C Entered By: Worthy Keeler on 07/12/2021 14:48:18 [name]  ([id number]) -------------------------------------------------------------------------------- Physician Orders Details Patient Name: [name] Date of Service: 07/12/2021 2:00 PM Medical Record Number: [id number] Patient Account Number: [id number] Date of Birth/Sex: 1950/09/20 (71 y.o. M) Treating RN: [name] Primary Care Provider: Felipa Eth Other Clinician: Referring Provider: Felipa Eth Treating Provider/Extender: Skipper Cliche in Treatment: 23 Verbal / Phone Orders: No Diagnosis Coding ICD-10 Coding Code Description E11.622 Type 2 diabetes mellitus with other skin ulcer I89.0 Lymphedema, not elsewhere classified I87.2 Venous insufficiency (chronic) (peripheral) L97.822 Non-pressure chronic ulcer of other part of left lower leg with fat layer exposed L97.512 Non-pressure chronic ulcer of other part of right foot with fat layer exposed I10 Essential (primary) hypertension L97.811 Non-pressure chronic ulcer of other part of right lower leg limited to breakdown of skin Follow-up Appointments o Return Appointment in 1 week. o Nurse Visit as needed o Other: - Keep appt at [facility] with PCP 08/12/21 Keep appt with [facility] Dr. Haynes Kerns 08/18/21 [place]: - [place]-see dressing changes notes on powder between toes-- o [place] for wound care. May utilize formulary equivalent dressing for wound treatment orders unless otherwise specified. Home Health Nurse may visit PRN to address patientos wound care needs. - frequency 3 times per week - patient will be seen once at wound center - home health to see patient 2 times per week o Scheduled days for dressing changes to be completed; exception, patient has scheduled wound care visit that day. o **Please direct any NON-WOUND related issues/requests for orders to patient's Primary Care Physician. **If current dressing causes regression in wound condition, may D/C ordered  dressing product/s and apply Normal Saline Moist Dressing daily until next [place] or Other MD appointment. **Notify Wound Healing Center of regression in wound condition at [phone]. Bathing/ Shower/ Hygiene o May shower with wound dressing protected with water repellent cover or cast protector. o No tub bath. Edema Control - Lymphedema / Segmental Compressive Device / Other Bilateral Lower Extremities o Optional: One layer of unna paste to top of compression wrap (to act as an anchor). - Unna paste on calf to secure wrap in place as needed o 4 Layer Compression System Lymphedema. - ****bilateral legs-total of 3 x per week bi lat, nystatin powder between toes on bilateral toes, 2x2 gauze between toes only needed on left side, apply zetuvits/ or formulary for extra absorbant dressing around ankles and foot area, use ABD to cover toes; 2 times per week with HH and 1 time at wound center [place], LIGHT TAN SPIRAL. WHITE WITH YELLOW LINE FIGURE 8 , COBAN SPIRAL o Elevate, Exercise Daily and Avoid Standing for Long Periods of Time. o Elevate legs to the level of the heart and pump ankles as often as possible o Elevate leg(s) parallel to the floor when sitting. o Compression Pump: Use compression pump on left lower extremity for 60 minutes, twice daily. - 2 times per day o DO YOUR BEST to sleep in the bed at night. DO NOT sleep in your recliner. Long hours of sitting in a recliner leads to swelling of the legs and/or potential wounds on your backside. o Other: - Contact prescriber regarding use of diuretics to reduce fluid overload. Off-Loading o Turn and reposition  every 2 hours Additional Orders / Instructions o Follow Nutritious Diet and Increase Protein Intake Electronic Signature(s) TRAYTON, SZABO ([id number]) Signed: 07/12/2021 4:03:57 PM By: Worthy Keeler PA-C Signed: 07/13/2021 8:30:35 AM By: [name] RN Entered By: [name] on  07/12/2021 14:36:27 [name] ([id number]) -------------------------------------------------------------------------------- Problem List Details Patient Name: [name] Date of Service: 07/12/2021 2:00 PM Medical Record Number: [id number] Patient Account Number: [id number] Date of Birth/Sex: [birth year] (71 y.o. M) Treating RN: [name] Primary Care Provider: Felipa Eth Other Clinician: Referring Provider: Felipa Eth Treating Provider/Extender: Skipper Cliche in Treatment: 23 Active Problems ICD-10 Encounter Code Description Active Date MDM Diagnosis E11.622 Type 2 diabetes mellitus with other skin ulcer 01/26/2021 No Yes I89.0 Lymphedema, not elsewhere classified 01/26/2021 No Yes I87.2 Venous insufficiency (chronic) (peripheral) 01/26/2021 No Yes L97.822 Non-pressure chronic ulcer of other part of left lower leg with fat layer 01/26/2021 No Yes exposed L97.512 Non-pressure chronic ulcer of other part of right foot with fat layer 01/26/2021 No Yes exposed [place] (primary) hypertension 01/26/2021 No Yes L97.811 Non-pressure chronic ulcer of other part of right lower leg limited to 02/15/2021 No Yes breakdown of skin Inactive Problems Resolved Problems Electronic Signature(s) Signed: 07/12/2021 2:17:57 PM By: Worthy Keeler PA-C Entered By: Worthy Keeler on 07/12/2021 14:17:57 [name] ([id number]) -------------------------------------------------------------------------------- Progress Note Details Patient Name: [name] Date of Service: 07/12/2021 2:00 PM Medical Record Number: [id number] Patient Account Number: [id number] Date of Birth/Sex: Jan 22, 1951 (71 y.o. M) Treating RN: [name] Primary Care Provider: Felipa Eth Other Clinician: Referring Provider: Felipa Eth Treating Provider/Extender: Skipper Cliche in Treatment: 23 Subjective Chief Complaint Information obtained from Patient Left LE ulcers History of Present  Illness (HPI) 71 year old male who presented to the ER with bilateral lower extremity blisters which had started last week. he has a past medical history of leukemia, diabetes mellitus, hypertension, edema of both lower extremities, his recurrent skin infections, peripheral vascular disease, coronary artery disease, congestive heart failure and peripheral neuropathy. in the ER he was given Rocephin and put on Silvadene cream. he was put on oral doxycycline and was asked to follow-up with the [facility]. His last hemoglobin A1c was 6.6 in December and he checks his blood sugar once a week. He does not have any physicians outside the [place] system. He does not recall any vascular duplex studies done either for arterial or venous disease but was told to wear compression stockings which he does not use 05/30/2016 -- we have not yet received any of his notes from the [facility] hospital system and his arterial and venous duplex studies are scheduled here in [place] around mid February. We are unable to have his insurance accepted by home health agencies and hence he is getting dressings only once a week. 06/06/16 -- -- I received a call from the patient's PCP at the [facility] at [facility] and spoke to Dr. Garvin Fila, phone number [phone] and fax number [phone]. She confirmed that no vascular testing was done over the last 5 years and she would be happy to do them if the patient did want them to be done at the [place] and we could fax him a request. Readmission: 71 year old male seen by as in February of this year and was referred to vein and vascular for studies and opinion from the vascular surgeons. The patient returns today with a fresh problem having had blisters on his left lower extremity which have been there for about 5 days and he clearly states that  he has been wearing his compression stockings as advised though he could not read the moderate compression and has been wearing light compression. Review  of his electronic medical records note that he had lower extremity arterial duplex examination done on 06/23/2016 which showed no hemodynamically significant stenosis in the bilateral lower extremity arterial system. He also had a lower extremity venous reflux examination done on 07/07/2016 and it was noted that he had venous incompetence in the right great saphenous vein and bilateral common femoral veins. Patient was seen by Dr. Tamala Julian on the same day and for some reason his notes do not reflect the venous studies or the arterial studies and he recommended patient do a venous duplex ultrasound to look for reflux and return to see him.he would also consider a lymph pump if required. The patient was told that his workup was normal and hence the patient canceled his follow-up appointment. 02/03/17 on evaluation today patient left medial lower extremity blister appears to be doing about the same. It is still continuing to drain and there's still the blistered skin covering the wound bed which is making it difficult for the alternate to do its job. Fortunately there is no evidence of cellulitis. No fevers chills noted. Patient states in general he is not having any significant discomfort. Patient's lower extremity arterial duplex exam revealed that patient was hemodynamically stable with no evidence of stenosis in regard to the bilateral lower extremities. The lower extremity venous reflux exam revealed the patient had venous incontinence noted in the right greater saphenous and bilateral common femoral vein. There is no evidence of deep or superficial vein thrombosis in the bilateral lower extremities. Readmission: 11/12/18 Patient presents for evaluation our clinic today concerning issues that he is having with his left lower extremity. He tells me that a couple weeks ago he began developing blisters on the left lower extremity along with increased swelling. He typically wears his compression stockings  on a regular basis is previously been evaluated both here as well is with vascular surgery they would recommend lymphedema pumps but unfortunately that somehow fell through and he never heard anything back from that. Nonetheless I think lymphedema pumps would be beneficial for this patient. He does have a history of hypertension and diabetes. Obviously the chronic venous stasis and lymphedema as well. At this point the blisters have been given in more trouble he states sometimes when the blisters openings able to clean it down with alcohol and it will dry out and do well. Unfortunately that has not been the case this time. He is having some discomfort although this mean these with cleaning the areas he doesn't have discomfort just on a regular basis. He has not been able to wear his compression stockings since the blisters arose due to the fact that of course it will drain into the socks causing additional issues and he didn't have any way to wrap this otherwise. He has increased to taking his Lasix every day instead of every other day. He sees his primary care provider later this month as well. No fevers, chills, nausea, or vomiting noted at this time. 11/19/18-Patient returns at 1 week, per intake RN the amount of seepage into the compression wraps was definitely improved, overall all the wounds are measuring smaller but continuing silver alginate to the wounds as primary dressing 11/26/18 on evaluation today patient appears to be doing quite well in regard to his left lower [place] ulcers. In fact of the areas that were noted initially  he only has two regions still open. There is no evidence of active infection at this time. He still is not heard anything from the company regarding lymphedema pumps as of yet. Again as previously seen vascular they have not recommended any surgical intervention. JERAMY, DIMMICK ([id number]) 12/03/2018 on evaluation today patient actually appears to be doing quite well  with regard to his lower extremity ulcers. In fact most of the areas appear to be healed the one spot which does not seem to be completely healed I am unsure of whether or not this is really draining that much but nonetheless there does not appear to be any signs of infection or significant drainage at this point. There is no sign of fever, chills, nausea, vomiting, or diarrhea. Overall I am pleased with how things have progressed I think is very close to being able to transition to his home compression stockings. 12/10/2018 upon evaluation today patient appears to be doing quite well with regard to his left lower extremity. He has been tolerating the dressing changes without complication. Fortunately there is no signs of active infection at this time. He appears after thorough evaluation of his leg to only have 1 small area that remains open at this point everything else appears to be almost completely closed. He still have significant swelling of the left lower extremity. We had discussed discussing this with his primary care provider he is not able to see her in person they were at the [facility] and right now the [place] is not seeing patients on site. According to the patient anyway. Subsequently he did speak with her apparently and his primary care provider feels that he may likely have a DVT. With that being said she has not seen his leg she is just going off of his history. Nonetheless that is a concern that the patient now has as well and while I do not feel the DVT is likely we can definitely ensure that that is not the case I will go ahead and see about putting that order in today. Nonetheless otherwise I am in a recommend that we continue with the current wound care measures including the compression therapy most likely. We just need to ensure that his leg is indeed free of any DVTs. 12/17/2018 on evaluation today patient actually appears to be completely healed today. He does have 2 very small  areas of blistering although this is not anything too significant at this point which is good news. With that being said I am in agreement with the fact that I think he is completely healed at this point. He does want to get back into his compression stocking. The good news is we have gotten approval from insurance for his lymphedema pumps we received a letter since last saw him last week. The other good news is his study did come back and showed no evidence of a DVT. 12/20/2018 on evaluation today patient presents for follow-up concerning his ongoing issues with his left lower extremity. He was actually discharged last Friday and did fairly well until he states blisters opened this morning. He tells me he has been wearing his compression stocking although he has a hard time getting this on. There does not appear to be any signs of active infection at this time. No fevers, chills, nausea, vomiting, or diarrhea. 12/27/2018 on evaluation today patient appears to be doing very well with regard to his swelling of the left lower extremity the 4 layer compression wrap seems to have  been beneficial for him. Fortunately there is no signs of active infection at this time. Patient has been tolerating the compression wrap without complication and his foot swelling in particular appears to be greatly improved. He does still have a wound on the lateral portion of his left leg I believe this is more of a blister that has now reopened. 01/03/2019 on evaluation today patient actually appears to be doing excellent in regard to his left lower extremity. He did receive his compression pumps and is actually use this 7 times since he was last here in the office. On top of the compression wrap he is now roughly 3 cm better at the calf and 2 cm better at the ankle he also states that his foot seem to go an issue better without even having to use a shoe horn. Obviously I think this is all evidence that he is doing excellent in  this regard. The other good news is he does not appear to have anything open today as far as wounds are concerned. 01/15/2019 on evaluation today patient appears to be doing more poorly yet again with regard to his left lower extremity. He has developed new wounds again after being discharged just recently. Unfortunately this continues to be the case that he will heal and then have subsequent new wounds. The last time I was hopeful that he may not end up coming back too quickly especially since he states he has been using his lymphedema pumps along with wearing his compression. Nonetheless he had a blister on the back of his leg that popped up on the left and this has opened up into an ulceration it is quite painful. 01/22/19 on evaluation today patient actually appears to be doing well with regard to his wound on the left lower extremity. He's been tolerating the dressing changes without complication including the compression wrap in the wound appears to be significantly smaller today which is great news. Overall very pleased in this regard. 01/29/2019 on evaluation today patient appears to be doing well with regard to his left posterior lower extremity ulcer. He has been tolerating the dressing changes without complication. This is not completely healed but is getting much closer. We did order a Farrow wrap 4000 for him he has received this and has it with him today although I am not sure we are quite ready to start him on that as of yet. We are very close. 02/05/2019 on evaluation today patient actually appears to be doing quite well with regard to his left posterior lower extremity ulcer. He still has a very tiny opening remaining but the fortunate thing is he seems to be healing quite nicely. He also did get his Farrow wrap which I am hoping will help with his edema control as well at home. Fortunately there is no evidence of active infection. 02/12/2019 patient and fortunately appears to be doing  poorly in regard to his wounds of the left lower extremity. He was very close to healing therefore we attempted to use his Velcro compression wraps continuing with lymphedema pumps at home. Unfortunately that does not seem to have done very well for him. He tells me that he wore them all the time but again I am not sure why if that is the case that he is having such significant edema. He is still on his fluid pills as well. With that being said there is no obvious sign of infection although I do wonder about the possibility of infection at  this time as well. 02/19/2019 unfortunately upon evaluation today patient appears to be doing more poorly with regard to his left lower extremity. He is not showing signs of significant improvement and I think the biggest issue here is that he does have an infection that appears to likely be Pseudomonas. That is based on the blue-green drainage that were noted at this time. Unfortunately the antibiotic that has been on is not going to take care of this at all. I think they will get a need to switch him to either Levaquin or Cipro and this was discussed with the patient. 02/26/2019 on evaluation today patient's lower extremity on the left appears to be doing significantly better as compared to last evaluation. Fortunately there is no signs of active infection at this time. He has been tolerating the compression wrap without complication in fact he made it the whole week at this point. He is showing signs of excellent improvement I am very happy in this regard. With that being said he is having some issues with infection we did review the results of his culture which I noted today. He did have a positive finding for Enterobacter as well as Alcaligenes faecalis. Fortunately the Levaquin that I placed him on will work for both which is great news. There is no signs of systemic infection at this point. 10/30; left posterior leg wound in the setting of very significant  edema and what looks like chronic venous inflammation. He has compression pumps but does not use them. We have been using 3 layer compression. Silver alginate to the wound as the primary dressing 03/18/2019 on evaluation today patient appears to be doing a little better compared to last time I saw him. He really has not been using his compression pumps he tells me that he is having too much discomfort. He has been keeping his wraps on however. He is only been taking his fluid pills every other day because he states they are not really helping and he has an appointment with his primary care provider at the [facility] tomorrow. Subsequently the wound itself on the left lower extremity does seem to be greatly improved compared to previous. MARCIA, LEPERA ([id number]) 03/25/2019 on evaluation today patient appears to be doing better with regard to his wounds on the bilateral lower extremities. The left is doing excellent the right is also doing better although both still do show some signs of open wounds noted at this point unfortunately. Fortunately there is no signs of active infection at this time. The patient also is not really having any significant pain which is good news. Unfortunately there was some confusion with the referral on vascular disease and as far as getting the patient scheduled there can be contacting him later today to do this fortunately we got this straightened out. 04/01/2019 on evaluation today patient appears to be doing no fevers, chills, nausea, vomiting, or diarrhea. Excellent at this time with regard to his lower extremities. There does not appear to be any open wound at this point which is good news. Fortunately is also no signs of active infection at this time. Overall feel like the patient has done excellent with the compression the problem is every time we got him to this point and then subsequently go to using his own compression things just go right back to where they were. I am  not sure how to address this we can try to get an appointment with vascular for 2 weeks now they have yet  to call him. Obviously this has become frustrating for the patient as well. I think the issue has just been an honest error as far as scheduling is concerned but nonetheless still worn out the point where I am unsure of which direction we should take. 04/08/2019 on evaluation today patient actually appears to be doing well with regard to his lower extremities. There are no open wounds at this time and things seem to be managing quite nicely as far as the overall edema control is concerned. With that being said he does have his compression socks today for [place] to go ahead and reinitiate therapy in that manner at this point. He is going to be going for shoes to be measured on Wednesday and then coincidentally he will also be seeing vascular on Thursday. Overall I think this is good news and again I am hopeful that they will be able to do something for him to help prevent ongoing issues with edema control as well. No fevers, chills, nausea, vomiting, or diarrhea. 04/11/2019 on evaluation today patient actually appears to be doing poorly after just being discharged on Monday of this week. He had been experiencing issues with again blisters especially on the left lower extremity. With that being said he was completely healed and appeared to be doing great this past Monday. He then subsequently has new blisters that formed before his appointment with vascular this morning. He was also measured for shoes in the interim. With that being said we may have figured out what exactly is going on and why he continues to have issues like what we are seeing at this point. He takes his compression stockings off at nighttime and then he ends up having to sleep in his chair for 5-6 hours a night. He sleeps with his feet down he cannot really get him up in the recliner and therefore he is sleeping and the worst possible  his position with his feet on the floor for that majority of the time. Again as I explained to him that is about one third at minimum at least one fourth of his day that he spending with his feet dangling down on the ground and the worst possible position they could be. I think this may be what is causing the issue. Subsequently I am leaning toward thinking that he may need a hospital bed in order to elevate his legs. We likely can have to coordinate this with his primary care provider at the [facility]. Readmission: 01/26/2021 this is a patient who presents for repeat evaluation here in the clinic although it is actually been couple of years since have seen him in fact it was December 2020 when I last saw him. Subsequently he never really healed but did end up being lost to follow-up. He tells me has been having issues ongoing with his lower extremities has bilateral lower extremity lymphedema no real significant or definitive open wounds but in general his lymphedema is way out of control. We were never able to refer him to lymphedema clinic simply due to the fact to be honest we were never able to get him completely healed. I do not see anyone with open wounds. The patient does have evidence of type 2 diabetes mellitus, lymphedema, chronic venous insufficiency, and hypertension. That really has not changed since his last evaluation. 02/09/2021 upon evaluation today patient appears to be doing a little better in regard to his legs although he still having a tremendous amount of drainage especially on  the left leg. Fortunately there does not appear to be any evidence of active infection. Of note when we looked into this further it appears that the patient did not have any absorptive dressing on it was just the 4-layer compression wrap. Nonetheless this is probably big part of the issue here. 10/10; he comes in today with 3 large areas on the upper right lower leg likely remanence of denuded  blistering under his compression wraps. He has no other wounds on the right. On the left he has the denuded area on the left medial foot and ankle and on the left dorsal foot. Massive lymphedema in both feet dorsally. Using Zetuvit under compression We have increased home health visitation to twice a week to change the dressings and will change it once 02/22/2021 upon evaluation today patient appears to be doing well currently with regard to his wounds. He has been tolerating the dressing changes without complication. Fortunately there does not appear to be any evidence of active infection which is great news. No fevers, chills, nausea, vomiting, or diarrhea. The biggest issue I see currently is that home health is not putting any medicine on the actual wounds before wrapping. 03/01/2021 upon evaluation today the patient's right leg actually appears to be doing quite well which is great news there does not appear to be any evidence of active infection at this time. No fevers, chills, nausea, vomiting, or diarrhea. With that being said the patient is having issues on the left foot where he is having significant drainage is also an ammonia smell he does not have any animals at home and this makes me concerned about a bacteria producing urea as a byproduct. Again the possible common organisms will be E. coli, Proteus, and Enterococcus. All 3 of which can be successfully treated with Levaquin. For that reason I think that this may be a good option for [place] to consider placing him on and I did obtain a culture as well for confirmation sake. 03/08/2021 upon evaluation today patient appears to be doing unfortunately still somewhat poorly in regard to his leg ulcerations. He actually has an area on the right leg where he blistered due to the fact that his wrap slid down and caused an area of pinching on his skin and this has led to a significant issue here. 03/15/2021 upon evaluation today patient  unfortunately has not been wrapped appropriately with absorptive dressings nor with the appropriate technique for the third layer of the 4-layer compression wrap. These are issues that we continue to try to address with the home health nurse. Also the absorptive dressing that she had was cut in half and therefore that causes things to leak out it does not actually trap the fluid in regard to the top of the foot overall I think that all these combined are really not seeing things improved significantly here. Fortunately there does not appear to be any signs of significant infection at this time which is good news. He still is having a tremendous amount of drainage. 03/22/2021 upon evaluation today patient appears to be draining tremendously. He still continues to tell me that he is using his pumps 2 times a day and that coupled with that tells me that he is elevating his legs as well. With that being said all things considered I am really just not seeing the improvement we would expect to see with the 4-layer compression wrap and all the above noted. He in fact had an extremely large Zetuvit dressing  on both legs and that they were extremely filled to the max with fluid. This is after just being changed just before the weekend and this JT, BRABEC ([id number]) is Monday. Nonetheless I am concerned about the fact that there is something going on fairly significant that we cannot get any of this under control and that he is draining this significantly. He supposed be having an echocardiogram it sounds like scheduling has been an issue for him as far as getting in sooner. Its something to do with needing his cousin to drive him because of where it sat and he cannot drive himself to this appointment either way I really think he needs to try to see what he can do about making this happen a little sooner. He tells me he will call today. 03/29/2021 on evaluation today patient appears to be doing about the  same in regard to his legs. He did get his cardiology appointment moved up to 6 December which is at least good that is better than what it was before mid December. Overall very pleased in that regard. 04/05/2021 upon evaluation today patient unfortunately is still doing fairly poorly. There does not appear to be any signs of active infection at this time. No fevers, chills, nausea, vomiting, or diarrhea. Unfortunately I think until his edema is under control and overall fluid overload there is really not to be much chance that I can do much to get him better. This is quite unfortunate and frustrating both for myself and the patient to be perfectly honest. Nonetheless I think that he really needs to have a conversation both with his primary care provider as well as cardiologist he sees the PCP on Monday and cardiology on Wednesday of next week. 04/13/2021 upon evaluation today patient appears to be doing poorly in regard to his bilateral lower extremities his left is still worse than the right. With that being said he has a tremendous amount of drainage he did see his primary care provider yesterday there really was not much there to be done from their perspective. He sees cardiology tomorrow. Nonetheless my biggest concern here is simply that if we do not get the edema under control he is going to continue to have drainage and honestly I think at some point he is going to become infected severely that is my main concern. 04/19/2021 upon evaluation today patient appears to be doing poorly still in regard to his legs. Unfortunately there does not appear to be any signs of infection at this point. He does have a tremendous amount of drainage however. We have not seen the results back from the cardiologist and the echocardiogram that was done. It appears that the patient checked out okay as far as that is concerned with regard to ejection fraction though we still have some issues here to be honest with  his diastolic function. I am unsure if this is accounting for everything that we are seeing or not. Either way he has a tremendous amount of drainage from his legs that we are just not able to control in the outpatient setting at this point. I have reached out to Dr. Rockey Situ his cardiologist to see once he reviews the sheet if there is anything that he feels like can be done from an outpatient perspective if not then I think the way to go is probably can to be through inpatient admission and diuresis. Otherwise I am not sure how working to get this under control we tried antibiotics, compression  wrapping, and I have told the patient to be elevating his legs I am not sure how much he does of this but either way I think that this is still an ongoing issue nonetheless. 04/26/2021 upon evaluation today patient appears to be doing poorly in regard to his legs. He is having a tremendous amount of fluid at this point which is quite unfortunate. Its to the point that he may have had at least 5 to 10 pounds of fluid in his dressings this morning when they were removed these were changed this Friday. Subsequently I think he needs to go to the ER for further evaluation and treatment I think is probably can need diuresis possibly even IV antibiotics been on what the blood work looks like but in general I feel like he needs something to get this under control from an outpatient perspective absent of everywhere I can think of and I cannot get this under control with our traditional measures. I think this is going require more so that we can get him better 12/30; this is a patient with severe bilateral lymphedema. He was hospitalized from 04/26/2021 through 04/29/2021 treated for cellulitis in the setting of lower extremity ulcers and lymphedema. After he left the hospital he is apparently seen for nurse visit our staff contacted cardiology and he has been started on Lasix 40 mg. Apparently his legs have less edema.  Lab work from 05/04/2021 showed a BUN of 38 and creatinine of 1.59 these are elevated versus previous where his creatinine seems to have been 1.30 on 12/19 his potassium is 4.3. I believe the lab work is being followed by cardiology We have him in a 4-layer wrap. Xeroform on the leg wounds and sit to fit on the Berry damage skin on the left dorsal foot versus right dorsal foot. He has compression pumps but does not use them. We have apparently not yet ordered him compression stockings 05/17/2021 upon evaluation today patient's legs though better than last time I personally saw him appear to be getting worse compared to where they were previous. Dr. Quentin Cornwall was actually last 1 to see you I have not seen him since 19 December. That was before he went into the ER. Coming out apparently his legs looked also and they still look better but not as good as they were in the past. 1/16; patient with severe bilateral lymphedema. Severe scaled hyperkeratotic skin on the dorsal aspect of his distal left foot and left medial ankle.. On the right side changes are not as bad. He did not have any weeping edema. Our intake nurse was convinced that he is being compliant with compression pumps 1 hour twice a day 05/31/2021 upon evaluation today patient actually appears to be doing a little bit better in my opinion in regard to his feet. I do not see as much drainage and it being just completely wet as it was previous. Fortunately I do not also see any signs of active infection which is great news as well. 06/07/21 Upon inspection patient's wound bed actually showed signs of doing well he is not nearly as weepy and wet as he has been in the past and overall very pleased in that regard. Fortunately I do not see any signs of active infection locally or nor systemically at this time. Which is great news. No fevers, chills, nausea, vomiting, or diarrhea. 06/14/2021 upon evaluation today patient appears to be doing well with  regard to his right foot I am pleased in that  regard. His left foot is still draining quite a bit despite using lymphedema pumps, 4-layer compression wraps, and he tells me elevating his legs as well. He also has Lasix that he takes twice a day. Nonetheless I believe that this is still good to be an ongoing issue. We have a hard time getting this under control as far as the swelling is concerned. 06/21/2021 upon evaluation today patient appears to be doing decently well in regard to his wounds all things considered. He still has a tremendous amount of drainage and fluid noted at this point. Fortunately I do not see any signs of active infection locally or systemically at this point which is great news. Nonetheless I am unsure where to go and how to do this as far as trying to limit his swelling and weeping from his toes in particular. 06/28/2021 upon evaluation patient unfortunately continues to have significant drainage from his feet. We have been keeping him in a compression wrap and despite this he still continues to have extreme fluid issues he seen his cardiologist he is seeing the nephrologist. We really cannot find any way to get this under control when he did well was when he was in the hospital and they got some of the fluid away. But outside of that we are just struggling to achieve the long-term goal of getting this under control and keeping it under control unfortunately. 07/05/2021 upon evaluation today patient appears to be doing poorly in regard to his feet. Unfortunately this continues to be a significant issue and to be honest I am really not certain what to do about it. I referred him to Dr. Randol Kern at [facility] and he does have an appointment although it is THERMON, ZULAUF ([id number]) 12 April. He also sees his primary care provider on 6 April. He did not want to see Dr. Haynes Kerns until after he saw his PCP that is the reason the appointment so far out. There is really not much I can do in  that regard. Nonetheless I do think that we are still continue to have significant lymphedema issues with significant mount of weeping in regard to the feet and again this has just become extremely difficult to manage to be honest I am not sure if there is something else that Dr. Haynes Kerns or someone else could recommend he also will be seeing Dr. Dellia Nims in 2 weeks when I am on vacation and at that time I will see if Dr. Dellia Nims has any ideas about where to go from here in the meantime. 07/12/2021 upon evaluation today patient appears to be doing about as well as can be expected with regard to his feet. He does actually see his kidney doctor this Friday. He also will be seeing his primary care provider on April 4 and then following that around mid April he will be seeing Dr. Haynes Kerns at [facility] which was a referral made for him. Again my goal is to try to find out some way to fix this and to be perfectly honest we have had some issues with making any good adjustments. When he was in the hospital and greater amounts of Lasix he was able to get this down and it looked much better upon discharge. With that being said right now things just are not doing nearly as good as what they used to be. Objective Constitutional Well-nourished and well-hydrated in no acute distress. Vitals Time Taken: 2:01 PM, Height: 73 in, Weight: 312 lbs, BMI: 41.2, Temperature: 98 F, Pulse:  73 bpm, Respiratory Rate: 18 breaths/min, Blood Pressure: 151/73 mmHg. Respiratory normal breathing without difficulty. Psychiatric this patient is able to make decisions and demonstrates good insight into disease process. Alert and Oriented x 3. pleasant and cooperative. General Notes: Upon inspection patient's wounds again are showing signs still of having quite a bit of drainage. Fortunately there does not appear to be any signs of active infection at this time which is great news. With that being said I still think that this is something that  is going to continue to be an issue if we cannot get his fluid levels down. He is on 40 of Lasix 1 time per day. This just may not be enough. Other Condition(s) Patient presents with Lymphedema located on the Left Leg. Patient presents with Lymphedema located on the Right Leg. 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 Post procedure Diagnosis Wound #: Same as Pre-Procedure There was a Four Layer Compression Therapy Procedure by [name], RN. Post procedure Diagnosis Wound #: Same as Pre-Procedure IZMAEL, DUROSS ([id number]) Plan Follow-up Appointments: Return Appointment in 1 week. Nurse Visit as needed Other: - Keep appt at [facility] with PCP 08/12/21 Keep appt with [facility] Wound center Dr. Haynes Kerns 08/18/21 Home Health: [place]: - [facility]-see dressing changes notes on powder between toes-- [place] for wound care. May utilize formulary equivalent dressing for wound treatment orders unless otherwise specified. Home Health Nurse may visit PRN to address patient s wound care needs. - frequency 3 times per week - patient will be seen once at wound center - home health to see patient 2 times per week Scheduled days for dressing changes to be completed; exception, patient has scheduled wound care visit that day. **Please direct any NON-WOUND related issues/requests for orders to patient's Primary Care Physician. **If current dressing causes regression in wound condition, may D/C ordered dressing  product/s and apply Normal Saline Moist Dressing daily until next [place] or Other MD appointment. **Notify Wound Healing Center of regression in wound condition at [phone]. Bathing/ Shower/ Hygiene: May shower with wound dressing protected with water repellent cover or cast protector. No tub bath. Edema Control - Lymphedema / Segmental Compressive Device / Other: Optional: One layer of unna paste to top of compression wrap (to act as an anchor). - Unna paste on calf to secure wrap in place as needed 4 Layer Compression System Lymphedema. - ****bilateral legs-total of 3 x per week bi lat, nystatin powder between toes on bilateral toes, 2x2 gauze between toes only needed on left side, apply zetuvits/ or formulary for extra absorbant dressing around ankles and foot area, use ABD to cover toes; 2 times per week with HH and 1 time at wound center [place], LIGHT TAN SPIRAL. WHITE WITH YELLOW LINE FIGURE 8 , COBAN SPIRAL Elevate, Exercise Daily and Avoid Standing for Long Periods of Time. Elevate legs to the level of the heart and pump ankles as often as possible Elevate leg(s) parallel to the floor when sitting. Compression Pump: Use compression pump on left lower extremity for 60 minutes, twice  daily. - 2 times per day DO YOUR BEST to sleep in the bed at night. DO NOT sleep in your recliner. Long hours of sitting in a recliner leads to swelling of the legs and/or potential wounds on your backside. Other: - Contact prescriber regarding use of diuretics to reduce fluid overload. Off-Loading: Turn and reposition every 2 hours Additional Orders / Instructions: Follow Nutritious Diet and Increase Protein Intake 1. I discussed with the patient that the biggest issue that I see is simply that he does not seem to get a lot better with everything that we are doing. I do believe that a lot of this is his fluid overload status in general and to be honest there is not much I can  do in that regard. I do want him to discuss with his kidney doctor this week when he sees him on Friday and to see if potentially increasing the fluid pills could be a possibility. I think this would benefit him as it did when he was in the hospital. 2. I do believe it is well continuing with the Zetuvit is the right thing to do there is really not much more that we can do the compression therapy also needs to be continue to try to keep the edema under control overall is much as we can. 3. I am also can recommend at this time that the patient should talk to his primary care provider as well about fluid pills and in general what can be done also think talking to Dr. Haynes Kerns at mid April when he sees him would be a good idea as well to see from a vascular standpoint there is anything that could be done to improve flow and limit some of this edema as well without just adding extra medication. We will see patient back for reevaluation in 1 week here in the clinic. If anything worsens or changes patient will contact our office for additional recommendations. Electronic Signature(s) Signed: 07/12/2021 2:50:02 PM By: Worthy Keeler PA-C Entered By: Worthy Keeler on 07/12/2021 14:50:01 [name] ([id number]) -------------------------------------------------------------------------------- SuperBill Details Patient Name: [name] Date of Service: 07/12/2021 Medical Record Number: [id number] Patient Account Number: [id number] Date of Birth/Sex: 1950/11/16 (71 y.o. M) Treating RN: [name] Primary Care Provider: Felipa Eth Other Clinician: Referring Provider: Felipa Eth Treating Provider/Extender: Skipper Cliche in Treatment: 23 Diagnosis Coding ICD-10 Codes Code Description E11.622 Type 2 diabetes mellitus with other skin ulcer I89.0 Lymphedema, not elsewhere classified I87.2 Venous insufficiency (chronic) (peripheral) L97.822 Non-pressure chronic ulcer of other part of  left lower leg with fat layer exposed L97.512 Non-pressure chronic ulcer of other part of right foot with fat layer exposed I10 Essential (primary) hypertension L97.811 Non-pressure chronic ulcer of other part of right lower leg limited to breakdown of skin Facility Procedures CPT4: Description Modifier Quantity Code 98338250 [id number] BILATERAL: Application of multi-layer venous compression system; leg (below knee), including 1 ankle and foot. Physician Procedures CPT4 Code: 7341937 Description: 90240 - WC PHYS LEVEL 4 - EST PT Modifier: Quantity: 1 CPT4 Code: Description: ICD-10 Diagnosis Description E11.622 Type 2 diabetes mellitus with other skin ulcer I89.0 Lymphedema, not elsewhere classified I87.2 Venous insufficiency (chronic) (peripheral) L97.822 Non-pressure chronic ulcer of other part of left lower  leg with fat layer Modifier: exposed Quantity: Electronic Signature(s) Signed: 07/12/2021 2:50:30 PM By: Worthy Keeler PA-C Entered By: Worthy Keeler on 07/12/2021 14:50:30

## 2021-07-13 NOTE — Progress Notes (Signed)
FLAY, GHOSH ([id number]) Visit Report for 07/12/2021 Arrival Information Details Patient Name: [name], [name] Date of Service: 07/12/2021 2:00 PM Medical Record Number: [id number] Patient Account Number: [id number] Date of Birth/Sex: [birth year] (71 y.o. M) Treating RN: [name] Primary Care Milanna Kozlov: Felipa Eth Other Clinician: Referring Akelia Husted: Felipa Eth Treating Rogerick Baldwin/Extender: Skipper Cliche in Treatment: 23 Visit Information History Since Last Visit All ordered tests and consults were completed: No Patient Arrived: Ambulatory Added or deleted any medications: No Arrival Time: 13:55 Any new allergies or adverse reactions: No Accompanied By: self Had a fall or experienced change in No Transfer Assistance: None activities of daily living that may affect Patient Identification Verified: Yes risk of falls: Secondary Verification Process Completed: Yes Signs or symptoms of abuse/neglect since last visito No Patient Requires Transmission-Based No Hospitalized since last visit: No Precautions: Implantable device outside of the clinic excluding No Patient Has Alerts: Yes cellular tissue based products placed in the center Patient Alerts: AVVS consult on file since last visit: Last ABI-R 1.09; L Has Dressing in Place as Prescribed: Yes 1.04 Pain Present Now: No Electronic Signature(s) Signed: 07/13/2021 8:30:35 AM By: [name] RN Entered By: [name] on 07/12/2021 14:01:36 [name] ([id number]) -------------------------------------------------------------------------------- Clinic Level of Care Assessment Details Patient Name: [name] Date of Service: 07/12/2021 2:00 PM Medical Record Number: [id number] Patient Account Number: [id number] Date of Birth/Sex: [birth year] (71 y.o. M) Treating RN: [name] Primary Care Lindy Pennisi: Felipa Eth Other Clinician: Referring David Rodriquez: Felipa Eth Treating Bradford Cazier/Extender: Skipper Cliche in Treatment: 23 Clinic Level of Care Assessment Items TOOL 1 Quantity Score '[]'$  - Use when EandM and Procedure is performed on INITIAL visit 0 ASSESSMENTS - Nursing Assessment / Reassessment '[]'$  - General Physical Exam (combine w/ comprehensive assessment (listed just below) when performed on new 0 pt. evals) '[]'$  - 0 Comprehensive Assessment (HX, ROS, Risk Assessments, Wounds Hx, etc.) ASSESSMENTS - Wound and Skin Assessment / Reassessment '[]'$  - Dermatologic / Skin Assessment (not related to wound area) 0 ASSESSMENTS - Ostomy and/or Continence Assessment and Care '[]'$  - Incontinence Assessment and Management 0 '[]'$  - 0 Ostomy Care Assessment and Management (repouching, etc.) PROCESS - Coordination of Care '[]'$  - Simple Patient / Family Education for ongoing care 0 '[]'$  - 0 Complex (extensive) Patient / Family Education for ongoing care '[]'$  - 0 Staff obtains Programmer, systems, Records, Test Results / Process Orders '[]'$  - 0 Staff telephones HHA, Nursing Homes / Clarify orders / etc '[]'$  - 0 Routine Transfer to another Facility (non-emergent condition) '[]'$  - 0 Routine Hospital Admission (non-emergent condition) '[]'$  - 0 New Admissions / Biomedical engineer / Ordering NPWT, Apligraf, etc. '[]'$  - 0 Emergency Hospital Admission (emergent condition) PROCESS - Special Needs '[]'$  - Pediatric / Minor Patient Management 0 '[]'$  - 0 Isolation Patient Management '[]'$  - 0 Hearing / Language / Visual special needs '[]'$  - 0 Assessment of Community assistance (transportation, D/C planning, etc.) '[]'$  - 0 Additional assistance / Altered mentation '[]'$  - 0 Support Surface(s) Assessment (bed, cushion, seat, etc.) INTERVENTIONS - Miscellaneous '[]'$  - External ear exam 0 '[]'$  - 0 Patient Transfer (multiple staff / Civil Service fast streamer / Similar devices) '[]'$  - 0 Simple Staple / Suture removal (25 or less) '[]'$  - 0 Complex Staple / Suture removal (26 or more) '[]'$  - 0 Hypo/Hyperglycemic Management (do not check if billed  separately) '[]'$  - 0 Ankle / Brachial Index (ABI) - do not check if billed separately Has the patient been seen at the hospital within the last  three years: Yes Total Score: 0 Level Of Care: ____ [name] ([id number]) Electronic Signature(s) Signed: 07/13/2021 8:30:35 AM By: [name] RN Entered By: [name] on 07/12/2021 14:36:50 [name] ([id number]) -------------------------------------------------------------------------------- Compression Therapy Details Patient Name: [name] Date of Service: 07/12/2021 2:00 PM Medical Record Number: [id number] Patient Account Number: [id number] Date of Birth/Sex: 1951-02-20 (71 y.o. M) Treating RN: [name] Primary Care Jamileth Putzier: Felipa Eth Other Clinician: Referring Xia Stohr: Felipa Eth Treating Jaquavious Mercer/Extender: Skipper Cliche in Treatment: 23 Compression Therapy Performed for Wound Assessment: NonWound Condition Lymphedema - Left Leg Performed By: Clinician [name], RN Compression Type: Four Layer Post Procedure Diagnosis Same as Pre-procedure Electronic Signature(s) Signed: 07/13/2021 8:30:35 AM By: [name] RN Entered By: [name] on 07/12/2021 14:35:17 [name] ([id number]) -------------------------------------------------------------------------------- Compression Therapy Details Patient Name: [name] Date of Service: 07/12/2021 2:00 PM Medical Record Number: [id number] Patient Account Number: [id number] Date of Birth/Sex: [birth year] (71 y.o. M) Treating RN: [name] Primary Care Alexica Schlossberg: Felipa Eth Other Clinician: Referring Courtland Reas: Felipa Eth Treating Debarah Mccumbers/Extender: Skipper Cliche in Treatment: 23 Compression Therapy Performed for Wound Assessment: NonWound Condition Lymphedema - Right Leg Performed By: Clinician [name], RN Compression Type: Four Layer Post Procedure Diagnosis Same as Pre-procedure Electronic Signature(s) Signed: 07/13/2021  8:30:35 AM By: [name] RN Entered By: [name] on 07/12/2021 14:35:33 [name] ([id number]) -------------------------------------------------------------------------------- Encounter Discharge Information Details Patient Name: [name] Date of Service: 07/12/2021 2:00 PM Medical Record Number: [id number] Patient Account Number: [id number] Date of Birth/Sex: [birth year] (71 y.o. M) Treating RN: [name] Primary Care Shakiara Lukic: Felipa Eth Other Clinician: Referring Jaydrien Wassenaar: Felipa Eth Treating Ichiro Chesnut/Extender: Skipper Cliche in Treatment: 23 Encounter Discharge Information Items Discharge Condition: Stable Ambulatory Status: Cane Discharge Destination: Home Transportation: Private Auto Accompanied By: self Schedule Follow-up Appointment: Yes Clinical Summary of Care: Patient Declined Electronic Signature(s) Signed: 07/13/2021 8:30:35 AM By: [name] RN Entered By: [name] on 07/12/2021 14:38:25 [name] ([id number]) -------------------------------------------------------------------------------- Lower Extremity Assessment Details Patient Name: [name] Date of Service: 07/12/2021 2:00 PM Medical Record Number: [id number] Patient Account Number: [id number] Date of Birth/Sex: 12-Jul-1950 (71 y.o. M) Treating RN: [name] Primary Care Sharyn Brilliant: Felipa Eth Other Clinician: Referring Baird Polinski: Felipa Eth Treating Darrnell Mangiaracina/Extender: Jeri Cos Weeks in Treatment: 23 Edema Assessment Assessed: [Left: No] [Right: No] Edema: [Left: Yes] [Right: Yes] Calf Left: Right: Point of Measurement: 35 cm From Medial Instep 40 cm 39 cm Ankle Left: Right: Point of Measurement: 10 cm From Medial Instep 33 cm 30 cm Vascular Assessment Pulses: Dorsalis Pedis Palpable: [Left:Yes] [Right:Yes] Electronic Signature(s) Signed: 07/13/2021 8:30:35 AM By: [name] RN Entered By: [name] on 07/12/2021 14:14:09 [name]  ([id number]) -------------------------------------------------------------------------------- Multi Wound Chart Details Patient Name: [name] Date of Service: 07/12/2021 2:00 PM Medical Record Number: [id number] Patient Account Number: [id number] Date of Birth/Sex: 1950-08-01 (71 y.o. M) Treating RN: [name] Primary Care Melany Wiesman: Felipa Eth Other Clinician: Referring Ayriel Texidor: Felipa Eth Treating Dorie Ohms/Extender: Skipper Cliche in Treatment: 23 Vital Signs Height(in): 73 Pulse(bpm): 73 Weight(lbs): 312 Blood Pressure(mmHg): 151/73 Body Mass Index(BMI): 41.2 Temperature(F): 98 Respiratory Rate(breaths/min): 18 Wound Assessments Treatment Notes Electronic Signature(s) Signed: 07/13/2021 8:30:35 AM By: [name] RN Entered By: [name] on 07/12/2021 14:14:39 [name] ([id number]) -------------------------------------------------------------------------------- Multi-Disciplinary Care Plan Details Patient Name: [name] Date of Service: 07/12/2021 2:00 PM Medical Record Number: [id number] Patient Account Number: [id number] Date of Birth/Sex: November 26, 1950 (71 y.o. M) Treating RN: [name] Primary Care Solash Tullo: Felipa Eth Other Clinician: Referring Kenlea Woodell:  Felipa Eth Treating Jasneet Schobert/Extender: Jeri Cos Weeks in Treatment: 23 Active Inactive Electronic Signature(s) Signed: 07/13/2021 8:30:35 AM By: [name] RN Entered By: [name] on 07/12/2021 14:14:22 [name] ([id number]) -------------------------------------------------------------------------------- Non-Wound Condition Assessment Details Patient Name: [name] Date of Service: 07/12/2021 2:00 PM Medical Record Number: [id number] Patient Account Number: [id number] Date of Birth/Gender: October 24, 1950 (71 y.o. M) Treating RN: [name] Primary Care Physician: Felipa Eth Other Clinician: Referring Physician: Felipa Eth Treating  Physician/Extender: Jeri Cos Weeks in Treatment: 23 Non-Wound Condition: Condition: Lymphedema Location: Leg Side: Left Notes: Anterior aspect of lower leg Electronic Signature(s) Signed: 07/13/2021 8:30:35 AM By: [name] RN Entered By: [name] on 07/12/2021 14:15:30 [name] ([id number]) -------------------------------------------------------------------------------- Non-Wound Condition Assessment Details Patient Name: [name] Date of Service: 07/12/2021 2:00 PM Medical Record Number: [id number] Patient Account Number: [id number] Date of Birth/Gender: [birth year] (71 y.o. M) Treating RN: [name] Primary Care Physician: Felipa Eth Other Clinician: Referring Physician: Felipa Eth Treating Physician/Extender: Jeri Cos Weeks in Treatment: 23 Non-Wound Condition: Condition: Lymphedema Location: Leg Side: Right Electronic Signature(s) Signed: 07/13/2021 8:30:35 AM By: [name] RN Entered By: [name] on 07/12/2021 14:15:30 [name] ([id number]) -------------------------------------------------------------------------------- Pain Assessment Details Patient Name: [name] Date of Service: 07/12/2021 2:00 PM Medical Record Number: [id number] Patient Account Number: [id number] Date of Birth/Sex: [birth year] (71 y.o. M) Treating RN: [name] Primary Care Ignazio Kincaid: Felipa Eth Other Clinician: Referring Vilma Will: Felipa Eth Treating Marialuisa Basara/Extender: Skipper Cliche in Treatment: 23 Active Problems Location of Pain Severity and Description of Pain Patient Has Paino No Site Locations Pain Management and Medication Current Pain Management: Electronic Signature(s) Signed: 07/13/2021 8:30:35 AM By: [name] RN Entered By: [name] on 07/12/2021 14:02:26 [name] ([id number]) -------------------------------------------------------------------------------- Patient/Caregiver Education Details Patient Name:  [name] Date of Service: 07/12/2021 2:00 PM Medical Record Number: [id number] Patient Account Number: [id number] Date of Birth/Gender: Sep 09, 1950 (71 y.o. M) Treating RN: [name] Primary Care Physician: Felipa Eth Other Clinician: Referring Physician: Felipa Eth Treating Physician/Extender: Skipper Cliche in Treatment: 23 Education Assessment Education Provided To: Patient Education Topics Provided Wound/Skin Impairment: Methods: Explain/Verbal Responses: State content correctly Electronic Signature(s) Signed: 07/13/2021 8:30:35 AM By: [name] RN Entered By: [name] on 07/12/2021 14:37:17 [name] ([id number]) -------------------------------------------------------------------------------- [place] Details Patient Name: [name] Date of Service: 07/12/2021 2:00 PM Medical Record Number: [id number] Patient Account Number: [id number] Date of Birth/Sex: [birth year] (71 y.o. M) Treating RN: [name] Primary Care Makella Buckingham: Felipa Eth Other Clinician: Referring Felisa Zechman: Felipa Eth Treating Teana Lindahl/Extender: Skipper Cliche in Treatment: 23 Vital Signs Time Taken: 14:01 Temperature (F): 98 Height (in): 73 Pulse (bpm): 73 Weight (lbs): 312 Respiratory Rate (breaths/min): 18 Body Mass Index (BMI): 41.2 Blood Pressure (mmHg): 151/73 Reference Range: 80 - 120 mg / dl Electronic Signature(s) Signed: 07/13/2021 8:30:35 AM By: [name] RN Entered By: [name] on 07/12/2021 14:02:06

## 2021-07-19 ENCOUNTER — Encounter (HOSPITAL_BASED_OUTPATIENT_CLINIC_OR_DEPARTMENT_OTHER): Payer: PPO | Admitting: Internal Medicine

## 2021-07-19 ENCOUNTER — Other Ambulatory Visit: Payer: Self-pay

## 2021-07-19 DIAGNOSIS — I890 Lymphedema, not elsewhere classified: Secondary | ICD-10-CM

## 2021-07-19 DIAGNOSIS — L97811 Non-pressure chronic ulcer of other part of right lower leg limited to breakdown of skin: Secondary | ICD-10-CM

## 2021-07-19 DIAGNOSIS — I872 Venous insufficiency (chronic) (peripheral): Secondary | ICD-10-CM | POA: Diagnosis not present

## 2021-07-19 DIAGNOSIS — E11622 Type 2 diabetes mellitus with other skin ulcer: Secondary | ICD-10-CM | POA: Diagnosis not present

## 2021-07-19 DIAGNOSIS — L97822 Non-pressure chronic ulcer of other part of left lower leg with fat layer exposed: Secondary | ICD-10-CM

## 2021-07-19 NOTE — Progress Notes (Signed)
KAIEA, ESSELMAN ([id number]) Visit Report for 07/19/2021 Chief Complaint Document Details Patient Name: [name], [name] Date of Service: 07/19/2021 2:00 PM Medical Record Number: [id number] Patient Account Number: [id number] Date of Birth/Sex: 1950/09/18 (71 y.o. M) Treating RN: [name] Primary Care Provider: Felipa Eth Other Clinician: Referring Provider: Felipa Eth Treating Provider/Extender: Yaakov Guthrie in Treatment: 24 Information Obtained from: Patient Chief Complaint Left LE ulcers Electronic Signature(s) Signed: 07/19/2021 2:45:04 PM By: Kalman Shan DO Entered By: Kalman Shan on 07/19/2021 14:39:57 [name] ([id number]) -------------------------------------------------------------------------------- HPI Details Patient Name: [name] Date of Service: 07/19/2021 2:00 PM Medical Record Number: [id number] Patient Account Number: [id number] Date of Birth/Sex: Nov 08, 1950 (71 y.o. M) Treating RN: [name] Primary Care Provider: Felipa Eth Other Clinician: Referring Provider: Felipa Eth Treating Provider/Extender: Yaakov Guthrie in Treatment: 24 History of Present Illness HPI Description: 71 year old male who presented to the ER with bilateral lower extremity blisters which had started last week. he has a past medical history of leukemia, diabetes mellitus, hypertension, edema of both lower extremities, his recurrent skin infections, peripheral vascular disease, coronary artery disease, congestive heart failure and peripheral neuropathy. in the ER he was given Rocephin and put on Silvadene cream. he was put on oral doxycycline and was asked to follow-up with the [facility]. His last hemoglobin A1c was 6.6 in December and he checks his blood sugar once a week. He does not have any physicians outside the [place] system. He does not recall any vascular duplex studies done either for arterial or venous disease but was told to wear  compression stockings which he does not use 05/30/2016 -- we have not yet received any of his notes from the [facility] hospital system and his arterial and venous duplex studies are scheduled here in [place] around mid February. We are unable to have his insurance accepted by home health agencies and hence he is getting dressings only once a week. 06/06/16 -- -- I received a call from the patient's PCP at the [facility] at [facility] and spoke to Dr. Garvin Fila, phone number [phone] and fax number [phone]. She confirmed that no vascular testing was done over the last 5 years and she would be happy to do them if the patient did want them to be done at the [place] and we could fax him a request. Readmission: 71 year old male seen by as in February of this year and was referred to vein and vascular for studies and opinion from the vascular surgeons. The patient returns today with a fresh problem having had blisters on his left lower extremity which have been there for about 5 days and he clearly states that he has been wearing his compression stockings as advised though he could not read the moderate compression and has been wearing light compression. Review of his electronic medical records note that he had lower extremity arterial duplex examination done on 06/23/2016 which showed no hemodynamically significant stenosis in the bilateral lower extremity arterial system. He also had a lower extremity venous reflux examination done on 07/07/2016 and it was noted that he had venous incompetence in the right great saphenous vein and bilateral common femoral veins. Patient was seen by Dr. Tamala Julian on the same day and for some reason his notes do not reflect the venous studies or the arterial studies and he recommended patient do a venous duplex ultrasound to look for reflux and return to see him.he would also consider a lymph pump if required. The patient was told that his workup was normal  and hence the patient  canceled his follow-up appointment. 02/03/17 on evaluation today patient left medial lower extremity blister appears to be doing about the same. It is still continuing to drain and there's still the blistered skin covering the wound bed which is making it difficult for the alternate to do its job. Fortunately there is no evidence of cellulitis. No fevers chills noted. Patient states in general he is not having any significant discomfort. Patient's lower extremity arterial duplex exam revealed that patient was hemodynamically stable with no evidence of stenosis in regard to the bilateral lower extremities. The lower extremity venous reflux exam revealed the patient had venous incontinence noted in the right greater saphenous and bilateral common femoral vein. There is no evidence of deep or superficial vein thrombosis in the bilateral lower extremities. Readmission: 11/12/18 Patient presents for evaluation our clinic today concerning issues that he is having with his left lower extremity. He tells me that a couple weeks ago he began developing blisters on the left lower extremity along with increased swelling. He typically wears his compression stockings on a regular basis is previously been evaluated both here as well is with vascular surgery they would recommend lymphedema pumps but unfortunately that somehow fell through and he never heard anything back from that. Nonetheless I think lymphedema pumps would be beneficial for this patient. He does have a history of hypertension and diabetes. Obviously the chronic venous stasis and lymphedema as well. At this point the blisters have been given in more trouble he states sometimes when the blisters openings able to clean it down with alcohol and it will dry out and do well. Unfortunately that has not been the case this time. He is having some discomfort although this mean these with cleaning the areas he doesn't have discomfort just on a regular basis. He  has not been able to wear his compression stockings since the blisters arose due to the fact that of course it will drain into the socks causing additional issues and he didn't have any way to wrap this otherwise. He has increased to taking his Lasix every day instead of every other day. He sees his primary care provider later this month as well. No fevers, chills, nausea, or vomiting noted at this time. 11/19/18-Patient returns at 1 week, per intake RN the amount of seepage into the compression wraps was definitely improved, overall all the wounds are measuring smaller but continuing silver alginate to the wounds as primary dressing 11/26/18 on evaluation today patient appears to be doing quite well in regard to his left lower [place] ulcers. In fact of the areas that were noted initially he only has two regions still open. There is no evidence of active infection at this time. He still is not heard anything from the company regarding lymphedema pumps as of yet. Again as previously seen vascular they have not recommended any surgical intervention. 12/03/2018 on evaluation today patient actually appears to be doing quite well with regard to his lower extremity ulcers. In fact most of the areas appear to be healed the one spot which does not seem to be completely healed I am unsure of whether or not this is really draining that much but nonetheless there does not appear to be any signs of infection or significant drainage at this point. There is no sign of fever, chills, nausea, vomiting, or diarrhea. Overall I am pleased with how things have progressed I think is very close to being able to transition to his  home compression stockings. [name], [name] ([id number]) 12/10/2018 upon evaluation today patient appears to be doing quite well with regard to his left lower extremity. He has been tolerating the dressing changes without complication. Fortunately there is no signs of active infection at this time. He  appears after thorough evaluation of his leg to only have 1 small area that remains open at this point everything else appears to be almost completely closed. He still have significant swelling of the left lower extremity. We had discussed discussing this with his primary care provider he is not able to see her in person they were at the [facility] and right now the [place] is not seeing patients on site. According to the patient anyway. Subsequently he did speak with her apparently and his primary care provider feels that he may likely have a DVT. With that being said she has not seen his leg she is just going off of his history. Nonetheless that is a concern that the patient now has as well and while I do not feel the DVT is likely we can definitely ensure that that is not the case I will go ahead and see about putting that order in today. Nonetheless otherwise I am in a recommend that we continue with the current wound care measures including the compression therapy most likely. We just need to ensure that his leg is indeed free of any DVTs. 12/17/2018 on evaluation today patient actually appears to be completely healed today. He does have 2 very small areas of blistering although this is not anything too significant at this point which is good news. With that being said I am in agreement with the fact that I think he is completely healed at this point. He does want to get back into his compression stocking. The good news is we have gotten approval from insurance for his lymphedema pumps we received a letter since last saw him last week. The other good news is his study did come back and showed no evidence of a DVT. 12/20/2018 on evaluation today patient presents for follow-up concerning his ongoing issues with his left lower extremity. He was actually discharged last Friday and did fairly well until he states blisters opened this morning. He tells me he has been wearing his compression stocking although  he has a hard time getting this on. There does not appear to be any signs of active infection at this time. No fevers, chills, nausea, vomiting, or diarrhea. 12/27/2018 on evaluation today patient appears to be doing very well with regard to his swelling of the left lower extremity the 4 layer compression wrap seems to have been beneficial for him. Fortunately there is no signs of active infection at this time. Patient has been tolerating the compression wrap without complication and his foot swelling in particular appears to be greatly improved. He does still have a wound on the lateral portion of his left leg I believe this is more of a blister that has now reopened. 01/03/2019 on evaluation today patient actually appears to be doing excellent in regard to his left lower extremity. He did receive his compression pumps and is actually use this 7 times since he was last here in the office. On top of the compression wrap he is now roughly 3 cm better at the calf and 2 cm better at the ankle he also states that his foot seem to go an issue better without even having to use a shoe horn. Obviously I think this  is all evidence that he is doing excellent in this regard. The other good news is he does not appear to have anything open today as far as wounds are concerned. 01/15/2019 on evaluation today patient appears to be doing more poorly yet again with regard to his left lower extremity. He has developed new wounds again after being discharged just recently. Unfortunately this continues to be the case that he will heal and then have subsequent new wounds. The last time I was hopeful that he may not end up coming back too quickly especially since he states he has been using his lymphedema pumps along with wearing his compression. Nonetheless he had a blister on the back of his leg that popped up on the left and this has opened up into an ulceration it is quite painful. 01/22/19 on evaluation today patient  actually appears to be doing well with regard to his wound on the left lower extremity. He's been tolerating the dressing changes without complication including the compression wrap in the wound appears to be significantly smaller today which is great news. Overall very pleased in this regard. 01/29/2019 on evaluation today patient appears to be doing well with regard to his left posterior lower extremity ulcer. He has been tolerating the dressing changes without complication. This is not completely healed but is getting much closer. We did order a Farrow wrap 4000 for him he has received this and has it with him today although I am not sure we are quite ready to start him on that as of yet. We are very close. 02/05/2019 on evaluation today patient actually appears to be doing quite well with regard to his left posterior lower extremity ulcer. He still has a very tiny opening remaining but the fortunate thing is he seems to be healing quite nicely. He also did get his Farrow wrap which I am hoping will help with his edema control as well at home. Fortunately there is no evidence of active infection. 02/12/2019 patient and fortunately appears to be doing poorly in regard to his wounds of the left lower extremity. He was very close to healing therefore we attempted to use his Velcro compression wraps continuing with lymphedema pumps at home. Unfortunately that does not seem to have done very well for him. He tells me that he wore them all the time but again I am not sure why if that is the case that he is having such significant edema. He is still on his fluid pills as well. With that being said there is no obvious sign of infection although I do wonder about the possibility of infection at this time as well. 02/19/2019 unfortunately upon evaluation today patient appears to be doing more poorly with regard to his left lower extremity. He is not showing signs of significant improvement and I think the  biggest issue here is that he does have an infection that appears to likely be Pseudomonas. That is based on the blue-green drainage that were noted at this time. Unfortunately the antibiotic that has been on is not going to take care of this at all. I think they will get a need to switch him to either Levaquin or Cipro and this was discussed with the patient. 02/26/2019 on evaluation today patient's lower extremity on the left appears to be doing significantly better as compared to last evaluation. Fortunately there is no signs of active infection at this time. He has been tolerating the compression wrap without complication in fact he  made it the whole week at this point. He is showing signs of excellent improvement I am very happy in this regard. With that being said he is having some issues with infection we did review the results of his culture which I noted today. He did have a positive finding for Enterobacter as well as Alcaligenes faecalis. Fortunately the Levaquin that I placed him on will work for both which is great news. There is no signs of systemic infection at this point. 10/30; left posterior leg wound in the setting of very significant edema and what looks like chronic venous inflammation. He has compression pumps but does not use them. We have been using 3 layer compression. Silver alginate to the wound as the primary dressing 03/18/2019 on evaluation today patient appears to be doing a little better compared to last time I saw him. He really has not been using his compression pumps he tells me that he is having too much discomfort. He has been keeping his wraps on however. He is only been taking his fluid pills every other day because he states they are not really helping and he has an appointment with his primary care provider at the [facility] tomorrow. Subsequently the wound itself on the left lower extremity does seem to be greatly improved compared to previous. 03/25/2019 on evaluation  today patient appears to be doing better with regard to his wounds on the bilateral lower extremities. The left is doing excellent the right is also doing better although both still do show some signs of open wounds noted at this point unfortunately. Fortunately there is no signs of active infection at this time. The patient also is not really having any significant pain which is good news. Unfortunately there was some confusion with the referral on vascular disease and as far as getting the patient scheduled there can be contacting him later today to do this [name], [name] ([id number]) fortunately we got this straightened out. 04/01/2019 on evaluation today patient appears to be doing no fevers, chills, nausea, vomiting, or diarrhea. Excellent at this time with regard to his lower extremities. There does not appear to be any open wound at this point which is good news. Fortunately is also no signs of active infection at this time. Overall feel like the patient has done excellent with the compression the problem is every time we got him to this point and then subsequently go to using his own compression things just go right back to where they were. I am not sure how to address this we can try to get an appointment with vascular for 2 weeks now they have yet to call him. Obviously this has become frustrating for the patient as well. I think the issue has just been an honest error as far as scheduling is concerned but nonetheless still worn out the point where I am unsure of which direction we should take. 04/08/2019 on evaluation today patient actually appears to be doing well with regard to his lower extremities. There are no open wounds at this time and things seem to be managing quite nicely as far as the overall edema control is concerned. With that being said he does have his compression socks today for [place] to go ahead and reinitiate therapy in that manner at this point. He is going to be going for  shoes to be measured on Wednesday and then coincidentally he will also be seeing vascular on Thursday. Overall I think this is good news and again I  am hopeful that they will be able to do something for him to help prevent ongoing issues with edema control as well. No fevers, chills, nausea, vomiting, or diarrhea. 04/11/2019 on evaluation today patient actually appears to be doing poorly after just being discharged on Monday of this week. He had been experiencing issues with again blisters especially on the left lower extremity. With that being said he was completely healed and appeared to be doing great this past Monday. He then subsequently has new blisters that formed before his appointment with vascular this morning. He was also measured for shoes in the interim. With that being said we may have figured out what exactly is going on and why he continues to have issues like what we are seeing at this point. He takes his compression stockings off at nighttime and then he ends up having to sleep in his chair for 5-6 hours a night. He sleeps with his feet down he cannot really get him up in the recliner and therefore he is sleeping and the worst possible his position with his feet on the floor for that majority of the time. Again as I explained to him that is about one third at minimum at least one fourth of his day that he spending with his feet dangling down on the ground and the worst possible position they could be. I think this may be what is causing the issue. Subsequently I am leaning toward thinking that he may need a hospital bed in order to elevate his legs. We likely can have to coordinate this with his primary care provider at the [facility] Readmission: 01/26/2021 this is a patient who presents for repeat evaluation here in the clinic although it is actually been couple of years since have seen him in fact it was December 2020 when I last saw him. Subsequently he never really healed but  did end up being lost to follow-up. He tells me has been having issues ongoing with his lower extremities has bilateral lower extremity lymphedema no real significant or definitive open wounds but in general his lymphedema is way out of control. We were never able to refer him to lymphedema clinic simply due to the fact to be honest we were never able to get him completely healed. I do not see anyone with open wounds. The patient does have evidence of type 2 diabetes mellitus, lymphedema, chronic venous insufficiency, and hypertension. That really has not changed since his last evaluation. 02/09/2021 upon evaluation today patient appears to be doing a little better in regard to his legs although he still having a tremendous amount of drainage especially on the left leg. Fortunately there does not appear to be any evidence of active infection. Of note when we looked into this further it appears that the patient did not have any absorptive dressing on it was just the 4-layer compression wrap. Nonetheless this is probably big part of the issue here. 10/10; he comes in today with 3 large areas on the upper right lower leg likely remanence of denuded blistering under his compression wraps. He has no other wounds on the right. On the left he has the denuded area on the left medial foot and ankle and on the left dorsal foot. Massive lymphedema in both feet dorsally. Using Zetuvit under compression We have increased home health visitation to twice a week to change the dressings and will change it once 02/22/2021 upon evaluation today patient appears to be doing well currently with regard  to his wounds. He has been tolerating the dressing changes without complication. Fortunately there does not appear to be any evidence of active infection which is great news. No fevers, chills, nausea, vomiting, or diarrhea. The biggest issue I see currently is that home health is not putting any medicine on the actual wounds  before wrapping. 03/01/2021 upon evaluation today the patient's right leg actually appears to be doing quite well which is great news there does not appear to be any evidence of active infection at this time. No fevers, chills, nausea, vomiting, or diarrhea. With that being said the patient is having issues on the left foot where he is having significant drainage is also an ammonia smell he does not have any animals at home and this makes me concerned about a bacteria producing urea as a byproduct. Again the possible common organisms will be E. coli, Proteus, and Enterococcus. All 3 of which can be successfully treated with Levaquin. For that reason I think that this may be a good option for [place] to consider placing him on and I did obtain a culture as well for confirmation sake. 03/08/2021 upon evaluation today patient appears to be doing unfortunately still somewhat poorly in regard to his leg ulcerations. He actually has an area on the right leg where he blistered due to the fact that his wrap slid down and caused an area of pinching on his skin and this has led to a significant issue here. 03/15/2021 upon evaluation today patient unfortunately has not been wrapped appropriately with absorptive dressings nor with the appropriate technique for the third layer of the 4-layer compression wrap. These are issues that we continue to try to address with the home health nurse. Also the absorptive dressing that she had was cut in half and therefore that causes things to leak out it does not actually trap the fluid in regard to the top of the foot overall I think that all these combined are really not seeing things improved significantly here. Fortunately there does not appear to be any signs of significant infection at this time which is good news. He still is having a tremendous amount of drainage. 03/22/2021 upon evaluation today patient appears to be draining tremendously. He still continues to tell me  that he is using his pumps 2 times a day and that coupled with that tells me that he is elevating his legs as well. With that being said all things considered I am really just not seeing the improvement we would expect to see with the 4-layer compression wrap and all the above noted. He in fact had an extremely large Zetuvit dressing on both legs and that they were extremely filled to the max with fluid. This is after just being changed just before the weekend and this is Monday. Nonetheless I am concerned about the fact that there is something going on fairly significant that we cannot get any of this under control and that he is draining this significantly. He supposed be having an echocardiogram it sounds like scheduling has been an issue for him as far as getting in sooner. Its something to do with needing his cousin to drive him because of where it sat and he cannot drive himself to this appointment either way I really think he needs to try to see what he can do about making this happen a little sooner. He tells me he will call today. [name], [name] ([id number]) 03/29/2021 on evaluation today patient appears to be doing about  the same in regard to his legs. He did get his cardiology appointment moved up to 6 December which is at least good that is better than what it was before mid December. Overall very pleased in that regard. 04/05/2021 upon evaluation today patient unfortunately is still doing fairly poorly. There does not appear to be any signs of active infection at this time. No fevers, chills, nausea, vomiting, or diarrhea. Unfortunately I think until his edema is under control and overall fluid overload there is really not to be much chance that I can do much to get him better. This is quite unfortunate and frustrating both for myself and the patient to be perfectly honest. Nonetheless I think that he really needs to have a conversation both with his primary care provider as well as  cardiologist he sees the PCP on Monday and cardiology on Wednesday of next week. 04/13/2021 upon evaluation today patient appears to be doing poorly in regard to his bilateral lower extremities his left is still worse than the right. With that being said he has a tremendous amount of drainage he did see his primary care provider yesterday there really was not much there to be done from their perspective. He sees cardiology tomorrow. Nonetheless my biggest concern here is simply that if we do not get the edema under control he is going to continue to have drainage and honestly I think at some point he is going to become infected severely that is my main concern. 04/19/2021 upon evaluation today patient appears to be doing poorly still in regard to his legs. Unfortunately there does not appear to be any signs of infection at this point. He does have a tremendous amount of drainage however. We have not seen the results back from the cardiologist and the echocardiogram that was done. It appears that the patient checked out okay as far as that is concerned with regard to ejection fraction though we still have some issues here to be honest with his diastolic function. I am unsure if this is accounting for everything that we are seeing or not. Either way he has a tremendous amount of drainage from his legs that we are just not able to control in the outpatient setting at this point. I have reached out to Dr. Rockey Situ his cardiologist to see once he reviews the sheet if there is anything that he feels like can be done from an outpatient perspective if not then I think the way to go is probably can to be through inpatient admission and diuresis. Otherwise I am not sure how working to get this under control we tried antibiotics, compression wrapping, and I have told the patient to be elevating his legs I am not sure how much he does of this but either way I think that this is still an ongoing issue  nonetheless. 04/26/2021 upon evaluation today patient appears to be doing poorly in regard to his legs. He is having a tremendous amount of fluid at this point which is quite unfortunate. Its to the point that he may have had at least 5 to 10 pounds of fluid in his dressings this morning when they were removed these were changed this Friday. Subsequently I think he needs to go to the ER for further evaluation and treatment I think is probably can need diuresis possibly even IV antibiotics been on what the blood work looks like but in general I feel like he needs something to get this under control from an outpatient  perspective absent of everywhere I can think of and I cannot get this under control with our traditional measures. I think this is going require more so that we can get him better 12/30; this is a patient with severe bilateral lymphedema. He was hospitalized from 04/26/2021 through 04/29/2021 treated for cellulitis in the setting of lower extremity ulcers and lymphedema. After he left the hospital he is apparently seen for nurse visit our staff contacted cardiology and he has been started on Lasix 40 mg. Apparently his legs have less edema. Lab work from 05/04/2021 showed a BUN of 38 and creatinine of 1.59 these are elevated versus previous where his creatinine seems to have been 1.30 on 12/19 his potassium is 4.3. I believe the lab work is being followed by cardiology We have him in a 4-layer wrap. Xeroform on the leg wounds and sit to fit on the Berry damage skin on the left dorsal foot versus right dorsal foot. He has compression pumps but does not use them. We have apparently not yet ordered him compression stockings 05/17/2021 upon evaluation today patient's legs though better than last time I personally saw him appear to be getting worse compared to where they were previous. Dr. Quentin Cornwall was actually last 1 to see you I have not seen him since 19 December. That was before he went into  the ER. Coming out apparently his legs looked also and they still look better but not as good as they were in the past. 1/16; patient with severe bilateral lymphedema. Severe scaled hyperkeratotic skin on the dorsal aspect of his distal left foot and left medial ankle.. On the right side changes are not as bad. He did not have any weeping edema. Our intake nurse was convinced that he is being compliant with compression pumps 1 hour twice a day 05/31/2021 upon evaluation today patient actually appears to be doing a little bit better in my opinion in regard to his feet. I do not see as much drainage and it being just completely wet as it was previous. Fortunately I do not also see any signs of active infection which is great news as well. 06/07/21 Upon inspection patient's wound bed actually showed signs of doing well he is not nearly as weepy and wet as he has been in the past and overall very pleased in that regard. Fortunately I do not see any signs of active infection locally or nor systemically at this time. Which is great news. No fevers, chills, nausea, vomiting, or diarrhea. 06/14/2021 upon evaluation today patient appears to be doing well with regard to his right foot I am pleased in that regard. His left foot is still draining quite a bit despite using lymphedema pumps, 4-layer compression wraps, and he tells me elevating his legs as well. He also has Lasix that he takes twice a day. Nonetheless I believe that this is still good to be an ongoing issue. We have a hard time getting this under control as far as the swelling is concerned. 06/21/2021 upon evaluation today patient appears to be doing decently well in regard to his wounds all things considered. He still has a tremendous amount of drainage and fluid noted at this point. Fortunately I do not see any signs of active infection locally or systemically at this point which is great news. Nonetheless I am unsure where to go and how to do this  as far as trying to limit his swelling and weeping from his toes in particular. 06/28/2021 upon  evaluation patient unfortunately continues to have significant drainage from his feet. We have been keeping him in a compression wrap and despite this he still continues to have extreme fluid issues he seen his cardiologist he is seeing the nephrologist. We really cannot find any way to get this under control when he did well was when he was in the hospital and they got some of the fluid away. But outside of that we are just struggling to achieve the long-term goal of getting this under control and keeping it under control unfortunately. 07/05/2021 upon evaluation today patient appears to be doing poorly in regard to his feet. Unfortunately this continues to be a significant issue and to be honest I am really not certain what to do about it. I referred him to Dr. Randol Kern at [facility] and he does have an appointment although it is 12 April. He also sees his primary care provider on 6 April. He did not want to see Dr. Haynes Kerns until after he saw his PCP that is the reason the appointment so far out. There is really not much I can do in that regard. Nonetheless I do think that we are still continue to have significant lymphedema issues with significant mount of weeping in regard to the feet and again this has just become extremely difficult to manage to be honest I am not sure if there is something else that Dr. Haynes Kerns or someone else could recommend he also will be seeing Dr. Dellia Nims in 2 weeks when I am on vacation and at that time I will see if Dr. Dellia Nims has any ideas about where to go from here in the meantime. [name], [name] ([id number]) 07/12/2021 upon evaluation today patient appears to be doing about as well as can be expected with regard to his feet. He does actually see his kidney doctor this Friday. He also will be seeing his primary care provider on April 4 and then following that around mid April he will be  seeing Dr. Haynes Kerns at [facility] which was a referral made for him. Again my goal is to try to find out some way to fix this and to be perfectly honest we have had some issues with making any good adjustments. When he was in the hospital and greater amounts of Lasix he was able to get this down and it looked much better upon discharge. With that being said right now things just are not doing nearly as good as what they used to be. 3/13; patient presents for follow-up. He has been using his lymphedema pumps over the past week. He reports an increase in his Lasix dose. He has no issues or complaints today. He denies signs of infection. Electronic Signature(s) Signed: 07/19/2021 2:45:04 PM By: Kalman Shan DO Entered By: Kalman Shan on 07/19/2021 14:40:33 [name] ([id number]) -------------------------------------------------------------------------------- Physical Exam Details Patient Name: [name] Date of Service: 07/19/2021 2:00 PM Medical Record Number: [id number] Patient Account Number: [id number] Date of Birth/Sex: 1951/03/04 (71 y.o. M) Treating RN: [name] Primary Care Provider: Felipa Eth Other Clinician: Referring Provider: Felipa Eth Treating Provider/Extender: Yaakov Guthrie in Treatment: 24 Constitutional . Cardiovascular . Psychiatric . Notes Bilateral lower extremity lymphedema skin changes. Good edema control. Venous stasis dermatitis bilaterally. Multiple areas of weeping but no discernible open wounds. Electronic Signature(s) Signed: 07/19/2021 2:45:04 PM By: Kalman Shan DO Entered By: Kalman Shan on 07/19/2021 14:41:17 [name] ([id number]) -------------------------------------------------------------------------------- Physician Orders Details Patient Name: [name] Date of Service: 07/19/2021 2:00 PM Medical Record  Number: [id number] Patient Account Number: [id number] Date of Birth/Sex: 1950-11-23 (71 y.o.  M) Treating RN: [name] Primary Care Provider: Felipa Eth Other Clinician: Referring Provider: Felipa Eth Treating Provider/Extender: Yaakov Guthrie in Treatment: 42 Verbal / Phone Orders: No Diagnosis Coding Follow-up Appointments o Return Appointment in 1 week. o Nurse Visit as needed o Other: - Keep appt at [facility] with PCP 08/12/21 Keep appt with [facility] center Dr. Haynes Kerns 08/18/21 [place]: - [place]-see dressing changes notes on powder between toes-- o [place] for wound care. May utilize formulary equivalent dressing for wound treatment orders unless otherwise specified. Home Health Nurse may visit PRN to address patientos wound care needs. - frequency 3 times per week - patient will be seen once at wound center - home health to see patient 2 times per week o Scheduled days for dressing changes to be completed; exception, patient has scheduled wound care visit that day. o **Please direct any NON-WOUND related issues/requests for orders to patient's Primary Care Physician. **If current dressing causes regression in wound condition, may D/C ordered dressing product/s and apply Normal Saline Moist Dressing daily until next [place] or Other MD appointment. **Notify Wound Healing Center of regression in wound condition at [phone]. Bathing/ Shower/ Hygiene o May shower with wound dressing protected with water repellent cover or cast protector. o No tub bath. Edema Control - Lymphedema / Segmental Compressive Device / Other Bilateral Lower Extremities o Optional: One layer of unna paste to top of compression wrap (to act as an anchor). - Unna paste on calf to secure wrap in place as needed o 4 Layer Compression System Lymphedema. - ****bilateral legs-total of 3 x per week bi lat, nystatin powder between toes on bilateral toes, 2x2 gauze between toes only needed on left side, apply zetuvits/  or formulary for extra absorbant dressing around ankles and foot area, use ABD to cover toes; 2 times per week with HH and 1 time at wound center [place], LIGHT TAN SPIRAL. WHITE WITH YELLOW LINE FIGURE 8 , COBAN SPIRAL o Elevate, Exercise Daily and Avoid Standing for Long Periods of Time. o Elevate legs to the level of the heart and pump ankles as often as possible o Elevate leg(s) parallel to the floor when sitting. o Compression Pump: Use compression pump on left lower extremity for 60 minutes, twice daily. - 2 times per day o DO YOUR BEST to sleep in the bed at night. DO NOT sleep in your recliner. Long hours of sitting in a recliner leads to swelling of the legs and/or potential wounds on your backside. o Other: - Contact prescriber regarding use of diuretics to reduce fluid overload. Off-Loading o Turn and reposition every 2 hours Additional Orders / Instructions o Follow Nutritious Diet and Increase Protein Intake Electronic Signature(s) Signed: 07/19/2021 2:45:04 PM By: Kalman Shan DO Previous Signature: 07/19/2021 2:33:16 PM Version By: Kalman Shan DO Entered By: Kalman Shan on 07/19/2021 14:43:23 [name] ([id number]) -------------------------------------------------------------------------------- Problem List Details Patient Name: [name] Date of Service: 07/19/2021 2:00 PM Medical Record Number: [id number] Patient Account Number: [id number] Date of Birth/Sex: 1950/09/24 (71 y.o. M) Treating RN: [name] Primary Care Provider: Felipa Eth Other Clinician: Referring Provider: Felipa Eth Treating Provider/Extender: Yaakov Guthrie in Treatment: 24 Active Problems ICD-10 Encounter Code Description Active Date MDM Diagnosis E11.622 Type 2 diabetes mellitus with other skin ulcer 01/26/2021 No Yes I89.0 Lymphedema, not elsewhere classified 01/26/2021 No Yes I87.2 Venous  insufficiency (chronic) (peripheral)  01/26/2021 No Yes L97.822 Non-pressure chronic ulcer of other part of left lower leg with fat layer 01/26/2021 No Yes exposed L97.512 Non-pressure chronic ulcer of other part of right foot with fat layer 01/26/2021 No Yes exposed I10 Essential (primary) hypertension 01/26/2021 No Yes L97.811 Non-pressure chronic ulcer of other part of right lower leg limited to 02/15/2021 No Yes breakdown of skin Inactive Problems Resolved Problems Electronic Signature(s) Signed: 07/19/2021 2:45:04 PM By: Kalman Shan DO Entered By: Kalman Shan on 07/19/2021 14:39:49 [name] ([id number]) -------------------------------------------------------------------------------- Progress Note Details Patient Name: [name] Date of Service: 07/19/2021 2:00 PM Medical Record Number: [id number] Patient Account Number: [id number] Date of Birth/Sex: [birth year] (71 y.o. M) Treating RN: [name] Primary Care Provider: Felipa Eth Other Clinician: Referring Provider: Felipa Eth Treating Provider/Extender: Yaakov Guthrie in Treatment: 24 Subjective Chief Complaint Information obtained from Patient Left LE ulcers History of Present Illness (HPI) 71 year old male who presented to the ER with bilateral lower extremity blisters which had started last week. he has a past medical history of leukemia, diabetes mellitus, hypertension, edema of both lower extremities, his recurrent skin infections, peripheral vascular disease, coronary artery disease, congestive heart failure and peripheral neuropathy. in the ER he was given Rocephin and put on Silvadene cream. he was put on oral doxycycline and was asked to follow-up with the [facility]. His last hemoglobin A1c was 6.6 in December and he checks his blood sugar once a week. He does not have any physicians outside the [place] system. He does not recall any vascular duplex studies done either for arterial or venous disease but was told to wear  compression stockings which he does not use 05/30/2016 -- we have not yet received any of his notes from the [facility] hospital system and his arterial and venous duplex studies are scheduled here in [place] around mid February. We are unable to have his insurance accepted by home health agencies and hence he is getting dressings only once a week. 06/06/16 -- -- I received a call from the patient's PCP at the [facility] at [facility] and spoke to Dr. Garvin Fila, phone number [phone] and fax number [phone]. She confirmed that no vascular testing was done over the last 5 years and she would be happy to do them if the patient did want them to be done at the [place] and we could fax him a request. Readmission: 71 year old male seen by as in February of this year and was referred to vein and vascular for studies and opinion from the vascular surgeons. The patient returns today with a fresh problem having had blisters on his left lower extremity which have been there for about 5 days and he clearly states that he has been wearing his compression stockings as advised though he could not read the moderate compression and has been wearing light compression. Review of his electronic medical records note that he had lower extremity arterial duplex examination done on 06/23/2016 which showed no hemodynamically significant stenosis in the bilateral lower extremity arterial system. He also had a lower extremity venous reflux examination done on 07/07/2016 and it was noted that he had venous incompetence in the right great saphenous vein and bilateral common femoral veins. Patient was seen by Dr. Tamala Julian on the same day and for some reason his notes do not reflect the venous studies or the arterial studies and he recommended patient do a venous duplex ultrasound to look for reflux and return to see him.he would also consider  a lymph pump if required. The patient was told that his workup was normal and hence the patient  canceled his follow-up appointment. 02/03/17 on evaluation today patient left medial lower extremity blister appears to be doing about the same. It is still continuing to drain and there's still the blistered skin covering the wound bed which is making it difficult for the alternate to do its job. Fortunately there is no evidence of cellulitis. No fevers chills noted. Patient states in general he is not having any significant discomfort. Patient's lower extremity arterial duplex exam revealed that patient was hemodynamically stable with no evidence of stenosis in regard to the bilateral lower extremities. The lower extremity venous reflux exam revealed the patient had venous incontinence noted in the right greater saphenous and bilateral common femoral vein. There is no evidence of deep or superficial vein thrombosis in the bilateral lower extremities. Readmission: 11/12/18 Patient presents for evaluation our clinic today concerning issues that he is having with his left lower extremity. He tells me that a couple weeks ago he began developing blisters on the left lower extremity along with increased swelling. He typically wears his compression stockings on a regular basis is previously been evaluated both here as well is with vascular surgery they would recommend lymphedema pumps but unfortunately that somehow fell through and he never heard anything back from that. Nonetheless I think lymphedema pumps would be beneficial for this patient. He does have a history of hypertension and diabetes. Obviously the chronic venous stasis and lymphedema as well. At this point the blisters have been given in more trouble he states sometimes when the blisters openings able to clean it down with alcohol and it will dry out and do well. Unfortunately that has not been the case this time. He is having some discomfort although this mean these with cleaning the areas he doesn't have discomfort just on a regular basis. He  has not been able to wear his compression stockings since the blisters arose due to the fact that of course it will drain into the socks causing additional issues and he didn't have any way to wrap this otherwise. He has increased to taking his Lasix every day instead of every other day. He sees his primary care provider later this month as well. No fevers, chills, nausea, or vomiting noted at this time. 11/19/18-Patient returns at 1 week, per intake RN the amount of seepage into the compression wraps was definitely improved, overall all the wounds are measuring smaller but continuing silver alginate to the wounds as primary dressing 11/26/18 on evaluation today patient appears to be doing quite well in regard to his left lower [place] ulcers. In fact of the areas that were noted initially he only has two regions still open. There is no evidence of active infection at this time. He still is not heard anything from the company regarding lymphedema pumps as of yet. Again as previously seen vascular they have not recommended any surgical intervention. [name], [name] ([id number]) 12/03/2018 on evaluation today patient actually appears to be doing quite well with regard to his lower extremity ulcers. In fact most of the areas appear to be healed the one spot which does not seem to be completely healed I am unsure of whether or not this is really draining that much but nonetheless there does not appear to be any signs of infection or significant drainage at this point. There is no sign of fever, chills, nausea, vomiting, or diarrhea. Overall I am pleased  with how things have progressed I think is very close to being able to transition to his home compression stockings. 12/10/2018 upon evaluation today patient appears to be doing quite well with regard to his left lower extremity. He has been tolerating the dressing changes without complication. Fortunately there is no signs of active infection at this time. He  appears after thorough evaluation of his leg to only have 1 small area that remains open at this point everything else appears to be almost completely closed. He still have significant swelling of the left lower extremity. We had discussed discussing this with his primary care provider he is not able to see her in person they were at the [facility] and right now the [place] is not seeing patients on site. According to the patient anyway. Subsequently he did speak with her apparently and his primary care provider feels that he may likely have a DVT. With that being said she has not seen his leg she is just going off of his history. Nonetheless that is a concern that the patient now has as well and while I do not feel the DVT is likely we can definitely ensure that that is not the case I will go ahead and see about putting that order in today. Nonetheless otherwise I am in a recommend that we continue with the current wound care measures including the compression therapy most likely. We just need to ensure that his leg is indeed free of any DVTs. 12/17/2018 on evaluation today patient actually appears to be completely healed today. He does have 2 very small areas of blistering although this is not anything too significant at this point which is good news. With that being said I am in agreement with the fact that I think he is completely healed at this point. He does want to get back into his compression stocking. The good news is we have gotten approval from insurance for his lymphedema pumps we received a letter since last saw him last week. The other good news is his study did come back and showed no evidence of a DVT. 12/20/2018 on evaluation today patient presents for follow-up concerning his ongoing issues with his left lower extremity. He was actually discharged last Friday and did fairly well until he states blisters opened this morning. He tells me he has been wearing his compression stocking although  he has a hard time getting this on. There does not appear to be any signs of active infection at this time. No fevers, chills, nausea, vomiting, or diarrhea. 12/27/2018 on evaluation today patient appears to be doing very well with regard to his swelling of the left lower extremity the 4 layer compression wrap seems to have been beneficial for him. Fortunately there is no signs of active infection at this time. Patient has been tolerating the compression wrap without complication and his foot swelling in particular appears to be greatly improved. He does still have a wound on the lateral portion of his left leg I believe this is more of a blister that has now reopened. 01/03/2019 on evaluation today patient actually appears to be doing excellent in regard to his left lower extremity. He did receive his compression pumps and is actually use this 7 times since he was last here in the office. On top of the compression wrap he is now roughly 3 cm better at the calf and 2 cm better at the ankle he also states that his foot seem to go an  issue better without even having to use a shoe horn. Obviously I think this is all evidence that he is doing excellent in this regard. The other good news is he does not appear to have anything open today as far as wounds are concerned. 01/15/2019 on evaluation today patient appears to be doing more poorly yet again with regard to his left lower extremity. He has developed new wounds again after being discharged just recently. Unfortunately this continues to be the case that he will heal and then have subsequent new wounds. The last time I was hopeful that he may not end up coming back too quickly especially since he states he has been using his lymphedema pumps along with wearing his compression. Nonetheless he had a blister on the back of his leg that popped up on the left and this has opened up into an ulceration it is quite painful. 01/22/19 on evaluation today patient  actually appears to be doing well with regard to his wound on the left lower extremity. He's been tolerating the dressing changes without complication including the compression wrap in the wound appears to be significantly smaller today which is great news. Overall very pleased in this regard. 01/29/2019 on evaluation today patient appears to be doing well with regard to his left posterior lower extremity ulcer. He has been tolerating the dressing changes without complication. This is not completely healed but is getting much closer. We did order a Farrow wrap 4000 for him he has received this and has it with him today although I am not sure we are quite ready to start him on that as of yet. We are very close. 02/05/2019 on evaluation today patient actually appears to be doing quite well with regard to his left posterior lower extremity ulcer. He still has a very tiny opening remaining but the fortunate thing is he seems to be healing quite nicely. He also did get his Farrow wrap which I am hoping will help with his edema control as well at home. Fortunately there is no evidence of active infection. 02/12/2019 patient and fortunately appears to be doing poorly in regard to his wounds of the left lower extremity. He was very close to healing therefore we attempted to use his Velcro compression wraps continuing with lymphedema pumps at home. Unfortunately that does not seem to have done very well for him. He tells me that he wore them all the time but again I am not sure why if that is the case that he is having such significant edema. He is still on his fluid pills as well. With that being said there is no obvious sign of infection although I do wonder about the possibility of infection at this time as well. 02/19/2019 unfortunately upon evaluation today patient appears to be doing more poorly with regard to his left lower extremity. He is not showing signs of significant improvement and I think the  biggest issue here is that he does have an infection that appears to likely be Pseudomonas. That is based on the blue-green drainage that were noted at this time. Unfortunately the antibiotic that has been on is not going to take care of this at all. I think they will get a need to switch him to either Levaquin or Cipro and this was discussed with the patient. 02/26/2019 on evaluation today patient's lower extremity on the left appears to be doing significantly better as compared to last evaluation. Fortunately there is no signs of active infection at  this time. He has been tolerating the compression wrap without complication in fact he made it the whole week at this point. He is showing signs of excellent improvement I am very happy in this regard. With that being said he is having some issues with infection we did review the results of his culture which I noted today. He did have a positive finding for Enterobacter as well as Alcaligenes faecalis. Fortunately the Levaquin that I placed him on will work for both which is great news. There is no signs of systemic infection at this point. 10/30; left posterior leg wound in the setting of very significant edema and what looks like chronic venous inflammation. He has compression pumps but does not use them. We have been using 3 layer compression. Silver alginate to the wound as the primary dressing 03/18/2019 on evaluation today patient appears to be doing a little better compared to last time I saw him. He really has not been using his compression pumps he tells me that he is having too much discomfort. He has been keeping his wraps on however. He is only been taking his fluid pills every other day because he states they are not really helping and he has an appointment with his primary care provider at the [facility] tomorrow. Subsequently the wound itself on the left lower extremity does seem to be greatly improved compared to previous. [name], [name]  ([id number]) 03/25/2019 on evaluation today patient appears to be doing better with regard to his wounds on the bilateral lower extremities. The left is doing excellent the right is also doing better although both still do show some signs of open wounds noted at this point unfortunately. Fortunately there is no signs of active infection at this time. The patient also is not really having any significant pain which is good news. Unfortunately there was some confusion with the referral on vascular disease and as far as getting the patient scheduled there can be contacting him later today to do this fortunately we got this straightened out. 04/01/2019 on evaluation today patient appears to be doing no fevers, chills, nausea, vomiting, or diarrhea. Excellent at this time with regard to his lower extremities. There does not appear to be any open wound at this point which is good news. Fortunately is also no signs of active infection at this time. Overall feel like the patient has done excellent with the compression the problem is every time we got him to this point and then subsequently go to using his own compression things just go right back to where they were. I am not sure how to address this we can try to get an appointment with vascular for 2 weeks now they have yet to call him. Obviously this has become frustrating for the patient as well. I think the issue has just been an honest error as far as scheduling is concerned but nonetheless still worn out the point where I am unsure of which direction we should take. 04/08/2019 on evaluation today patient actually appears to be doing well with regard to his lower extremities. There are no open wounds at this time and things seem to be managing quite nicely as far as the overall edema control is concerned. With that being said he does have his compression socks today for [place] to go ahead and reinitiate therapy in that manner at this point. He is going to be  going for shoes to be measured on Wednesday and then coincidentally he will also be  seeing vascular on Thursday. Overall I think this is good news and again I am hopeful that they will be able to do something for him to help prevent ongoing issues with edema control as well. No fevers, chills, nausea, vomiting, or diarrhea. 04/11/2019 on evaluation today patient actually appears to be doing poorly after just being discharged on Monday of this week. He had been experiencing issues with again blisters especially on the left lower extremity. With that being said he was completely healed and appeared to be doing great this past Monday. He then subsequently has new blisters that formed before his appointment with vascular this morning. He was also measured for shoes in the interim. With that being said we may have figured out what exactly is going on and why he continues to have issues like what we are seeing at this point. He takes his compression stockings off at nighttime and then he ends up having to sleep in his chair for 5-6 hours a night. He sleeps with his feet down he cannot really get him up in the recliner and therefore he is sleeping and the worst possible his position with his feet on the floor for that majority of the time. Again as I explained to him that is about one third at minimum at least one fourth of his day that he spending with his feet dangling down on the ground and the worst possible position they could be. I think this may be what is causing the issue. Subsequently I am leaning toward thinking that he may need a hospital bed in order to elevate his legs. We likely can have to coordinate this with his primary care provider at the [facility]. Readmission: 01/26/2021 this is a patient who presents for repeat evaluation here in the clinic although it is actually been couple of years since have seen him in fact it was December 2020 when I last saw him. Subsequently he never really  healed but did end up being lost to follow-up. He tells me has been having issues ongoing with his lower extremities has bilateral lower extremity lymphedema no real significant or definitive open wounds but in general his lymphedema is way out of control. We were never able to refer him to lymphedema clinic simply due to the fact to be honest we were never able to get him completely healed. I do not see anyone with open wounds. The patient does have evidence of type 2 diabetes mellitus, lymphedema, chronic venous insufficiency, and hypertension. That really has not changed since his last evaluation. 02/09/2021 upon evaluation today patient appears to be doing a little better in regard to his legs although he still having a tremendous amount of drainage especially on the left leg. Fortunately there does not appear to be any evidence of active infection. Of note when we looked into this further it appears that the patient did not have any absorptive dressing on it was just the 4-layer compression wrap. Nonetheless this is probably big part of the issue here. 10/10; he comes in today with 3 large areas on the upper right lower leg likely remanence of denuded blistering under his compression wraps. He has no other wounds on the right. On the left he has the denuded area on the left medial foot and ankle and on the left dorsal foot. Massive lymphedema in both feet dorsally. Using Zetuvit under compression We have increased home health visitation to twice a week to change the dressings and will change it  once 02/22/2021 upon evaluation today patient appears to be doing well currently with regard to his wounds. He has been tolerating the dressing changes without complication. Fortunately there does not appear to be any evidence of active infection which is great news. No fevers, chills, nausea, vomiting, or diarrhea. The biggest issue I see currently is that home health is not putting any medicine on the  actual wounds before wrapping. 03/01/2021 upon evaluation today the patient's right leg actually appears to be doing quite well which is great news there does not appear to be any evidence of active infection at this time. No fevers, chills, nausea, vomiting, or diarrhea. With that being said the patient is having issues on the left foot where he is having significant drainage is also an ammonia smell he does not have any animals at home and this makes me concerned about a bacteria producing urea as a byproduct. Again the possible common organisms will be E. coli, Proteus, and Enterococcus. All 3 of which can be successfully treated with Levaquin. For that reason I think that this may be a good option for [place] to consider placing him on and I did obtain a culture as well for confirmation sake. 03/08/2021 upon evaluation today patient appears to be doing unfortunately still somewhat poorly in regard to his leg ulcerations. He actually has an area on the right leg where he blistered due to the fact that his wrap slid down and caused an area of pinching on his skin and this has led to a significant issue here. 03/15/2021 upon evaluation today patient unfortunately has not been wrapped appropriately with absorptive dressings nor with the appropriate technique for the third layer of the 4-layer compression wrap. These are issues that we continue to try to address with the home health nurse. Also the absorptive dressing that she had was cut in half and therefore that causes things to leak out it does not actually trap the fluid in regard to the top of the foot overall I think that all these combined are really not seeing things improved significantly here. Fortunately there does not appear to be any signs of significant infection at this time which is good news. He still is having a tremendous amount of drainage. 03/22/2021 upon evaluation today patient appears to be draining tremendously. He still continues  to tell me that he is using his pumps 2 times a day and that coupled with that tells me that he is elevating his legs as well. With that being said all things considered I am really just not seeing the improvement we would expect to see with the 4-layer compression wrap and all the above noted. He in fact had an extremely large Zetuvit dressing on both legs and that they were extremely filled to the max with fluid. This is after just being changed just before the weekend and this [name], [name] ([id number]) is Monday. Nonetheless I am concerned about the fact that there is something going on fairly significant that we cannot get any of this under control and that he is draining this significantly. He supposed be having an echocardiogram it sounds like scheduling has been an issue for him as far as getting in sooner. Its something to do with needing his cousin to drive him because of where it sat and he cannot drive himself to this appointment either way I really think he needs to try to see what he can do about making this happen a little sooner. He tells me  he will call today. 03/29/2021 on evaluation today patient appears to be doing about the same in regard to his legs. He did get his cardiology appointment moved up to 6 December which is at least good that is better than what it was before mid December. Overall very pleased in that regard. 04/05/2021 upon evaluation today patient unfortunately is still doing fairly poorly. There does not appear to be any signs of active infection at this time. No fevers, chills, nausea, vomiting, or diarrhea. Unfortunately I think until his edema is under control and overall fluid overload there is really not to be much chance that I can do much to get him better. This is quite unfortunate and frustrating both for myself and the patient to be perfectly honest. Nonetheless I think that he really needs to have a conversation both with his primary care provider as well  as cardiologist he sees the PCP on Monday and cardiology on Wednesday of next week. 04/13/2021 upon evaluation today patient appears to be doing poorly in regard to his bilateral lower extremities his left is still worse than the right. With that being said he has a tremendous amount of drainage he did see his primary care provider yesterday there really was not much there to be done from their perspective. He sees cardiology tomorrow. Nonetheless my biggest concern here is simply that if we do not get the edema under control he is going to continue to have drainage and honestly I think at some point he is going to become infected severely that is my main concern. 04/19/2021 upon evaluation today patient appears to be doing poorly still in regard to his legs. Unfortunately there does not appear to be any signs of infection at this point. He does have a tremendous amount of drainage however. We have not seen the results back from the cardiologist and the echocardiogram that was done. It appears that the patient checked out okay as far as that is concerned with regard to ejection fraction though we still have some issues here to be honest with his diastolic function. I am unsure if this is accounting for everything that we are seeing or not. Either way he has a tremendous amount of drainage from his legs that we are just not able to control in the outpatient setting at this point. I have reached out to Dr. Rockey Situ his cardiologist to see once he reviews the sheet if there is anything that he feels like can be done from an outpatient perspective if not then I think the way to go is probably can to be through inpatient admission and diuresis. Otherwise I am not sure how working to get this under control we tried antibiotics, compression wrapping, and I have told the patient to be elevating his legs I am not sure how much he does of this but either way I think that this is still an ongoing issue  nonetheless. 04/26/2021 upon evaluation today patient appears to be doing poorly in regard to his legs. He is having a tremendous amount of fluid at this point which is quite unfortunate. Its to the point that he may have had at least 5 to 10 pounds of fluid in his dressings this morning when they were removed these were changed this Friday. Subsequently I think he needs to go to the ER for further evaluation and treatment I think is probably can need diuresis possibly even IV antibiotics been on what the blood work looks like but in general  I feel like he needs something to get this under control from an outpatient perspective absent of everywhere I can think of and I cannot get this under control with our traditional measures. I think this is going require more so that we can get him better 12/30; this is a patient with severe bilateral lymphedema. He was hospitalized from 04/26/2021 through 04/29/2021 treated for cellulitis in the setting of lower extremity ulcers and lymphedema. After he left the hospital he is apparently seen for nurse visit our staff contacted cardiology and he has been started on Lasix 40 mg. Apparently his legs have less edema. Lab work from 05/04/2021 showed a BUN of 38 and creatinine of 1.59 these are elevated versus previous where his creatinine seems to have been 1.30 on 12/19 his potassium is 4.3. I believe the lab work is being followed by cardiology We have him in a 4-layer wrap. Xeroform on the leg wounds and sit to fit on the Berry damage skin on the left dorsal foot versus right dorsal foot. He has compression pumps but does not use them. We have apparently not yet ordered him compression stockings 05/17/2021 upon evaluation today patient's legs though better than last time I personally saw him appear to be getting worse compared to where they were previous. Dr. Quentin Cornwall was actually last 1 to see you I have not seen him since 19 December. That was before he went into  the ER. Coming out apparently his legs looked also and they still look better but not as good as they were in the past. 1/16; patient with severe bilateral lymphedema. Severe scaled hyperkeratotic skin on the dorsal aspect of his distal left foot and left medial ankle.. On the right side changes are not as bad. He did not have any weeping edema. Our intake nurse was convinced that he is being compliant with compression pumps 1 hour twice a day 05/31/2021 upon evaluation today patient actually appears to be doing a little bit better in my opinion in regard to his feet. I do not see as much drainage and it being just completely wet as it was previous. Fortunately I do not also see any signs of active infection which is great news as well. 06/07/21 Upon inspection patient's wound bed actually showed signs of doing well he is not nearly as weepy and wet as he has been in the past and overall very pleased in that regard. Fortunately I do not see any signs of active infection locally or nor systemically at this time. Which is great news. No fevers, chills, nausea, vomiting, or diarrhea. 06/14/2021 upon evaluation today patient appears to be doing well with regard to his right foot I am pleased in that regard. His left foot is still draining quite a bit despite using lymphedema pumps, 4-layer compression wraps, and he tells me elevating his legs as well. He also has Lasix that he takes twice a day. Nonetheless I believe that this is still good to be an ongoing issue. We have a hard time getting this under control as far as the swelling is concerned. 06/21/2021 upon evaluation today patient appears to be doing decently well in regard to his wounds all things considered. He still has a tremendous amount of drainage and fluid noted at this point. Fortunately I do not see any signs of active infection locally or systemically at this point which is great news. Nonetheless I am unsure where to go and how to do this  as far as  trying to limit his swelling and weeping from his toes in particular. 06/28/2021 upon evaluation patient unfortunately continues to have significant drainage from his feet. We have been keeping him in a compression wrap and despite this he still continues to have extreme fluid issues he seen his cardiologist he is seeing the nephrologist. We really cannot find any way to get this under control when he did well was when he was in the hospital and they got some of the fluid away. But outside of that we are just struggling to achieve the long-term goal of getting this under control and keeping it under control unfortunately. 07/05/2021 upon evaluation today patient appears to be doing poorly in regard to his feet. Unfortunately this continues to be a significant issue and to be honest I am really not certain what to do about it. I referred him to Dr. Randol Kern at [facility] and he does have an appointment although it is [name], [name] ([id number]) 12 April. He also sees his primary care provider on 6 April. He did not want to see Dr. Haynes Kerns until after he saw his PCP that is the reason the appointment so far out. There is really not much I can do in that regard. Nonetheless I do think that we are still continue to have significant lymphedema issues with significant mount of weeping in regard to the feet and again this has just become extremely difficult to manage to be honest I am not sure if there is something else that Dr. Haynes Kerns or someone else could recommend he also will be seeing Dr. Dellia Nims in 2 weeks when I am on vacation and at that time I will see if Dr. Dellia Nims has any ideas about where to go from here in the meantime. 07/12/2021 upon evaluation today patient appears to be doing about as well as can be expected with regard to his feet. He does actually see his kidney doctor this Friday. He also will be seeing his primary care provider on April 4 and then following that around mid April he will be  seeing Dr. Haynes Kerns at [facility] which was a referral made for him. Again my goal is to try to find out some way to fix this and to be perfectly honest we have had some issues with making any good adjustments. When he was in the hospital and greater amounts of Lasix he was able to get this down and it looked much better upon discharge. With that being said right now things just are not doing nearly as good as what they used to be. 3/13; patient presents for follow-up. He has been using his lymphedema pumps over the past week. He reports an increase in his Lasix dose. He has no issues or complaints today. He denies signs of infection. Objective Constitutional Vitals Time Taken: 2:08 PM, Height: 73 in, Weight: 312 lbs, BMI: 41.2, Temperature: 98.1 F, Pulse: 58 bpm, Respiratory Rate: 18 breaths/min, Blood Pressure: 130/58 mmHg. General Notes: Bilateral lower extremity lymphedema skin changes. Good edema control. Venous stasis dermatitis bilaterally. Multiple areas of weeping but no discernible open wounds. 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 He has good edema control. I recommended continuing with 4-layer compression and ABD pads. He has been using his lymphedema pumps and I encouraged continued use. His Lasix has been increased to 40 mg in the morning and 20 mg at night. He has no signs of infection on exam. Follow-up in 1 week. Procedures There was a Four Layer Compression Therapy Procedure by [name],  RN. Post procedure Diagnosis Wound #: Same as Pre-Procedure There was a Four Layer Compression Therapy Procedure by [name], RN. Post procedure Diagnosis Wound #: Same as Pre-Procedure [name], [name] ([id number]) Plan Follow-up Appointments: Return Appointment in 1 week. Nurse Visit as needed Other: - Keep appt at [facility] with PCP 08/12/21 Keep appt with [facility] Wound center Dr. Haynes Kerns 08/18/21 Home Health: [place]: - [facility]-see dressing changes notes on powder between toes-- [place] for wound care. May utilize formulary equivalent dressing for wound treatment orders unless otherwise specified. Home Health Nurse may visit PRN to address patient s wound care needs. - frequency 3 times per week - patient will be seen once at wound center - home health to see patient 2 times per week Scheduled days for dressing changes to be completed; exception, patient has scheduled wound care visit that day. **Please direct any NON-WOUND related issues/requests for orders to patient's Primary Care Physician. **If current dressing causes regression in wound condition, may D/C ordered dressing product/s and apply Normal Saline Moist Dressing daily until next [place] or Other MD appointment. **Notify Wound Healing Center of regression in wound condition at [phone]. Bathing/ Shower/ Hygiene: May shower with wound dressing protected with water repellent cover or cast protector. No tub bath. Edema Control - Lymphedema / Segmental Compressive Device / Other: Optional: One layer of unna paste to top of compression wrap (to act as an anchor). - Unna paste on calf to secure wrap in place as needed 4 Layer Compression System Lymphedema. - ****bilateral legs-total of 3 x per week bi lat, nystatin powder between toes on bilateral toes, 2x2 gauze between toes only needed on left side, apply zetuvits/ or formulary for extra absorbant dressing around ankles and foot area, use ABD to cover  toes; 2 times per week with HH and 1 time at wound center [place], LIGHT TAN SPIRAL. WHITE WITH YELLOW LINE FIGURE 8 , COBAN SPIRAL Elevate, Exercise Daily and Avoid Standing for Long Periods of Time. Elevate legs to the level of the heart and pump ankles as often as possible Elevate leg(s) parallel to the floor when sitting. Compression Pump: Use compression pump on left lower extremity for 60 minutes, twice daily. - 2 times per day DO YOUR BEST to sleep in the bed at night. DO NOT sleep in your recliner. Long hours of sitting in a recliner leads to swelling of the legs and/or potential wounds on your backside. Other: - Contact prescriber regarding use of diuretics to reduce fluid overload. Off-Loading: Turn and reposition every 2 hours Additional Orders / Instructions: Follow Nutritious Diet and Increase Protein Intake 1. ABD pads with 4-layer compression 2. Follow-up in 1 week Electronic Signature(s) Signed: 07/19/2021 2:45:04 PM By: Kalman Shan DO Entered By: Kalman Shan on 07/19/2021 14:42:28 [name] ([id number]) -------------------------------------------------------------------------------- [place] Details Patient Name: [name] Date of Service: 07/19/2021 Medical Record Number: [id number] Patient Account Number: [id number] Date of Birth/Sex: [birth year] (71 y.o. M) Treating RN: [name] Primary Care Provider: Felipa Eth Other Clinician: Referring Provider: Arnoldo Morale  Leo Rod Treating Provider/Extender: Yaakov Guthrie in Treatment: 24 Diagnosis Coding ICD-10 Codes Code Description E11.622 Type 2 diabetes mellitus with other skin ulcer I89.0 Lymphedema, not elsewhere classified I87.2 Venous insufficiency (chronic) (peripheral) L97.822 Non-pressure chronic ulcer of other part of left lower leg with fat layer exposed L97.512 Non-pressure chronic ulcer of other part of right foot with fat layer exposed I10 Essential (primary)  hypertension L97.811 Non-pressure chronic ulcer of other part of right lower leg limited to breakdown of skin Facility Procedures CPT4: Description Modifier Quantity Code 59741638 [id number] BILATERAL: Application of multi-layer venous compression system; leg (below knee), including 1 ankle and foot. Physician Procedures CPT4 Code: 6803212 Description: 24825 - WC PHYS LEVEL 3 - EST PT Modifier: Quantity: 1 CPT4 Code: Description: ICD-10 Diagnosis Description L97.822 Non-pressure chronic ulcer of other part of left lower leg with fat layer L97.811 Non-pressure chronic ulcer of other part of right lower leg limited to bre I89.0 Lymphedema, not elsewhere classified  I87.2 Venous insufficiency (chronic) (peripheral) Modifier: exposed akdown of skin Quantity: Electronic Signature(s) Signed: 07/19/2021 2:45:04 PM By: Kalman Shan DO Entered By: Kalman Shan on 07/19/2021 14:43:02

## 2021-07-19 NOTE — Progress Notes (Signed)
[name], Mooney ([id number]) Visit Report for 07/19/2021 Arrival Information Details Patient Name: [name], [name] Date of Service: 07/19/2021 2:00 PM Medical Record Number: [id number] Patient Account Number: [id number] Date of Birth/Sex: 09/10/1950 (71 y.o. M) Treating RN: [name] Primary Care Penny Frisbie: Felipa Eth Other Clinician: Referring Arora Coakley: Felipa Eth Treating Cristyn Crossno/Extender: Yaakov Guthrie in Treatment: 24 Visit Information History Since Last Visit All ordered tests and consults were completed: No Patient Arrived: [name] Added or deleted any medications: No Arrival Time: 14:02 Any new allergies or adverse reactions: No Accompanied By: self Had a fall or experienced change in No Transfer Assistance: None activities of daily living that may affect Patient Identification Verified: Yes risk of falls: Secondary Verification Process Completed: Yes Signs or symptoms of abuse/neglect since last visito No Patient Requires Transmission-Based No Hospitalized since last visit: No Precautions: Implantable device outside of the clinic excluding No Patient Has Alerts: Yes cellular tissue based products placed in the center Patient Alerts: AVVS consult on file since last visit: Last ABI-R 1.09; L Has Dressing in Place as Prescribed: Yes 1.04 Pain Present Now: No Electronic Signature(s) Signed: 07/19/2021 4:21:53 PM By: [name] RN Entered By: [name] on 07/19/2021 [place] ([id number]) -------------------------------------------------------------------------------- Clinic Level of Care Assessment Details Patient Name: [name] Date of Service: 07/19/2021 2:00 PM Medical Record Number: [id number] Patient Account Number: [id number] Date of Birth/Sex: [birth year] (71 y.o. M) Treating RN: [name] Primary Care Noeh Sparacino: Felipa Eth Other Clinician: Referring Kunal Levario: Felipa Eth Treating Hae Ahlers/Extender: Yaakov Guthrie in Treatment: 24 Clinic Level of Care Assessment Items TOOL 4 Quantity Score '[]'$  - Use when only an EandM is performed on FOLLOW-UP visit 0 ASSESSMENTS - Nursing Assessment / Reassessment '[]'$  - Reassessment of Co-morbidities (includes updates in patient status) 0 '[]'$  - 0 Reassessment of Adherence to Treatment Plan ASSESSMENTS - Wound and Skin Assessment / Reassessment '[]'$  - Simple Wound Assessment / Reassessment - one wound 0 '[]'$  - 0 Complex Wound Assessment / Reassessment - multiple wounds '[]'$  - 0 Dermatologic / Skin Assessment (not related to wound area) ASSESSMENTS - Focused Assessment '[]'$  - Circumferential Edema Measurements - multi extremities 0 '[]'$  - 0 Nutritional Assessment / Counseling / Intervention '[]'$  - 0 Lower Extremity Assessment (monofilament, tuning fork, pulses) '[]'$  - 0 Peripheral Arterial Disease Assessment (using hand held doppler) ASSESSMENTS - Ostomy and/or Continence Assessment and Care '[]'$  - Incontinence Assessment and Management 0 '[]'$  - 0 Ostomy Care Assessment and Management (repouching, etc.) PROCESS - Coordination of Care '[]'$  - Simple Patient / Family Education for ongoing care 0 '[]'$  - 0 Complex (extensive) Patient / Family Education for ongoing care '[]'$  - 0 Staff obtains Programmer, systems, Records, Test Results / Process Orders '[]'$  - 0 Staff telephones HHA, Nursing Homes / Clarify orders / etc '[]'$  - 0 Routine Transfer to another Facility (non-emergent condition) '[]'$  - 0 Routine Hospital Admission (non-emergent condition) '[]'$  - 0 New Admissions / Biomedical engineer / Ordering NPWT, Apligraf, etc. '[]'$  - 0 Emergency Hospital Admission (emergent condition) '[]'$  - 0 Simple Discharge Coordination '[]'$  - 0 Complex (extensive) Discharge Coordination PROCESS - Special Needs '[]'$  - Pediatric / Minor Patient Management 0 '[]'$  - 0 Isolation Patient Management '[]'$  - 0 Hearing / Language / Visual special needs '[]'$  - 0 Assessment of Community assistance (transportation,  D/C planning, etc.) '[]'$  - 0 Additional assistance / Altered mentation '[]'$  - 0 Support Surface(s) Assessment (bed, cushion, seat, etc.) INTERVENTIONS - Wound Cleansing / Measurement [place], [place] ([id number]) '[]'$  - 0 Simple Wound  Cleansing - one wound '[]'$  - 0 Complex Wound Cleansing - multiple wounds '[]'$  - 0 Wound Imaging (photographs - any number of wounds) '[]'$  - 0 Wound Tracing (instead of photographs) '[]'$  - 0 Simple Wound Measurement - one wound '[]'$  - 0 Complex Wound Measurement - multiple wounds INTERVENTIONS - Wound Dressings '[]'$  - Small Wound Dressing one or multiple wounds 0 '[]'$  - 0 Medium Wound Dressing one or multiple wounds '[]'$  - 0 Large Wound Dressing one or multiple wounds '[]'$  - 0 Application of Medications - topical '[]'$  - 0 Application of Medications - injection INTERVENTIONS - Miscellaneous '[]'$  - External ear exam 0 '[]'$  - 0 Specimen Collection (cultures, biopsies, blood, body fluids, etc.) '[]'$  - 0 Specimen(s) / Culture(s) sent or taken to Lab for analysis '[]'$  - 0 Patient Transfer (multiple staff / Civil Service fast streamer / Similar devices) '[]'$  - 0 Simple Staple / Suture removal (25 or less) '[]'$  - 0 Complex Staple / Suture removal (26 or more) '[]'$  - 0 Hypo / Hyperglycemic Management (close monitor of Blood Glucose) '[]'$  - 0 Ankle / Brachial Index (ABI) - do not check if billed separately '[]'$  - 0 Vital Signs Has the patient been seen at the hospital within the last three years: Yes Total Score: 0 Level Of Care: ____ Electronic Signature(s) Signed: 07/19/2021 4:21:53 PM By: [name] RN Entered By: [name] on 07/19/2021 14:39:14 [name] ([id number]) -------------------------------------------------------------------------------- Compression Therapy Details Patient Name: [name] Date of Service: 07/19/2021 2:00 PM Medical Record Number: [id number] Patient Account Number: [id number] Date of Birth/Sex: Jan 02, 1951 (71 y.o. M) Treating RN: [name] Primary Care  Illa Enlow: Felipa Eth Other Clinician: Referring Alandra Sando: Felipa Eth Treating Jacyln Carmer/Extender: Yaakov Guthrie in Treatment: 24 Compression Therapy Performed for Wound Assessment: NonWound Condition Lymphedema - Left Leg Performed By: Clinician [name], RN Compression Type: Four Layer Post Procedure Diagnosis Same as Pre-procedure Electronic Signature(s) Signed: 07/19/2021 4:21:53 PM By: [name] RN Entered By: [name] on 07/19/2021 14:11:32 [name] ([id number]) -------------------------------------------------------------------------------- Compression Therapy Details Patient Name: [name] Date of Service: 07/19/2021 2:00 PM Medical Record Number: [id number] Patient Account Number: [id number] Date of Birth/Sex: [birth year] (71 y.o. M) Treating RN: [name] Primary Care Antwanette Wesche: Felipa Eth Other Clinician: Referring Alzada Brazee: Felipa Eth Treating Frederick Marro/Extender: Yaakov Guthrie in Treatment: 24 Compression Therapy Performed for Wound Assessment: NonWound Condition Lymphedema - Right Leg Performed By: Clinician [name], RN Compression Type: Four Layer Post Procedure Diagnosis Same as Pre-procedure Electronic Signature(s) Signed: 07/19/2021 4:21:53 PM By: [name] RN Entered By: [name] on 07/19/2021 14:11:54 [name] ([id number]) -------------------------------------------------------------------------------- Encounter Discharge Information Details Patient Name: [name] Date of Service: 07/19/2021 2:00 PM Medical Record Number: [id number] Patient Account Number: [id number] Date of Birth/Sex: 12-26-1950 (71 y.o. M) Treating RN: [name] Primary Care Codie Hainer: Felipa Eth Other Clinician: Referring Effie Janoski: Felipa Eth Treating Jakie Debow/Extender: Yaakov Guthrie in Treatment: 24 Encounter Discharge Information Items Discharge Condition: Stable Ambulatory Status:  [place] Discharge Destination: Home Transportation: Private Auto Accompanied By: self Schedule Follow-up Appointment: Yes Clinical Summary of Care: Patient Declined Electronic Signature(s) Signed: 07/19/2021 4:21:53 PM By: [name] RN Entered By: [name] on 07/19/2021 14:40:40 [name] ([id number]) -------------------------------------------------------------------------------- Lower Extremity Assessment Details Patient Name: [name] Date of Service: 07/19/2021 2:00 PM Medical Record Number: [id number] Patient Account Number: [id number] Date of Birth/Sex: Oct 09, 1950 (71 y.o. M) Treating RN: [name] Primary Care Herta Hink: Felipa Eth Other Clinician: Referring Shakiyla Kook: Felipa Eth Treating Carrol Hougland/Extender: Yaakov Guthrie in Treatment: 24 Edema Assessment  Assessed: [Left: No] [Right: No] [Left: Edema] [Right: :] Calf Left: Right: Point of Measurement: 35 cm From Medial Instep 37 cm 38 cm Ankle Left: Right: Point of Measurement: 10 cm From Medial Instep 32 cm 30 cm Vascular Assessment Pulses: Dorsalis Pedis Palpable: [Left:Yes] [Right:Yes] Electronic Signature(s) Signed: 07/19/2021 4:21:53 PM By: [name] RN Entered By: [name] on 07/19/2021 14:25:48 [name] ([id number]) -------------------------------------------------------------------------------- Multi Wound Chart Details Patient Name: [name] Date of Service: 07/19/2021 2:00 PM Medical Record Number: [id number] Patient Account Number: [id number] Date of Birth/Sex: 1951-03-19 (71 y.o. M) Treating RN: [name] Primary Care Adela Esteban: Felipa Eth Other Clinician: Referring Leondre Taul: Felipa Eth Treating Gauri Galvao/Extender: Yaakov Guthrie in Treatment: 24 Vital Signs Height(in): 73 Pulse(bpm): 58 Weight(lbs): 312 Blood Pressure(mmHg): 130/58 Body Mass Index(BMI): 41.2 Temperature(F): 98.1 Respiratory Rate(breaths/min): 18 Wound  Assessments Treatment Notes Electronic Signature(s) Signed: 07/19/2021 4:21:53 PM By: [name] RN Entered By: [name] on 07/19/2021 14:11:11 [name] ([id number]) -------------------------------------------------------------------------------- [place] Details Patient Name: [name] Date of Service: 07/19/2021 2:00 PM Medical Record Number: [id number] Patient Account Number: [id number] Date of Birth/Sex: 02-Nov-1950 (71 y.o. M) Treating RN: [name] Primary Care Taejah Ohalloran: Felipa Eth Other Clinician: Referring Laterica Matarazzo: Felipa Eth Treating Boleslaus Holloway/Extender: Yaakov Guthrie in Treatment: 24 Active Inactive Electronic Signature(s) Signed: 07/19/2021 4:21:53 PM By: [name] RN Entered By: [name] on 07/19/2021 14:11:01 [name] ([id number]) -------------------------------------------------------------------------------- Pain Assessment Details Patient Name: [name] Date of Service: 07/19/2021 2:00 PM Medical Record Number: [id number] Patient Account Number: [id number] Date of Birth/Sex: [birth year] (71 y.o. M) Treating RN: [name] Primary Care Canna Nickelson: Felipa Eth Other Clinician: Referring Zerina Hallinan: Felipa Eth Treating Daimen Shovlin/Extender: Yaakov Guthrie in Treatment: 24 Active Problems Location of Pain Severity and Description of Pain Patient Has Paino No Site Locations Pain Management and Medication Current Pain Management: Electronic Signature(s) Signed: 07/19/2021 4:21:53 PM By: [name] RN Entered By: [name] on 07/19/2021 14:09:38 [name] ([id number]) -------------------------------------------------------------------------------- Patient/Caregiver Education Details Patient Name: [name] Date of Service: 07/19/2021 2:00 PM Medical Record Number: [id number] Patient Account Number: [id number] Date of Birth/Gender: December 24, 1950 (71 y.o. M) Treating RN: [name] Primary Care Physician: Felipa Eth Other Clinician: Referring Physician: Felipa Eth Treating Physician/Extender: Yaakov Guthrie in Treatment: 24 Education Assessment Education Provided To: Patient Education Topics Provided Wound/Skin Impairment: Methods: Explain/Verbal Responses: State content correctly Electronic Signature(s) Signed: 07/19/2021 4:21:53 PM By: [name] RN Entered By: [name] on 07/19/2021 14:39:43 [name] ([id number]) -------------------------------------------------------------------------------- [place] Details Patient Name: [name] Date of Service: 07/19/2021 2:00 PM Medical Record Number: [id number] Patient Account Number: [id number] Date of Birth/Sex: 1951-03-18 (71 y.o. M) Treating RN: [name] Primary Care Kentavius Dettore: Felipa Eth Other Clinician: Referring Versa Craton: Felipa Eth Treating Retia Cordle/Extender: Yaakov Guthrie in Treatment: 24 Vital Signs Time Taken: 14:08 Temperature (F): 98.1 Height (in): 73 Pulse (bpm): 58 Weight (lbs): 312 Respiratory Rate (breaths/min): 18 Body Mass Index (BMI): 41.2 Blood Pressure (mmHg): 130/58 Reference Range: 80 - 120 mg / dl Electronic Signature(s) Signed: 07/19/2021 4:21:53 PM By: [name] RN Entered By: [name] on 07/19/2021 14:08:30

## 2021-07-26 ENCOUNTER — Other Ambulatory Visit: Payer: Self-pay

## 2021-07-26 ENCOUNTER — Encounter: Payer: PPO | Admitting: Physician Assistant

## 2021-07-26 DIAGNOSIS — E11622 Type 2 diabetes mellitus with other skin ulcer: Secondary | ICD-10-CM | POA: Diagnosis not present

## 2021-07-26 NOTE — Progress Notes (Signed)
[place], [name] ([id number]) ?Visit Report for 07/26/2021 ?Arrival Information Details ?Patient Name: [name], [name] ?Date of Service: 07/26/2021 11:00 AM ?Medical Record Number: [id number] ?Patient Account Number: [id number] ?Date of Birth/Sex: 16-Mar-1951 (71 y.o. M) ?Treating RN: [name] ?Primary Care Kaetlyn Noa: Felipa Eth Other Clinician: ?Referring Myleen Brailsford: Felipa Eth ?Treating Chudney Scheffler/Extender: Jeri Cos ?Weeks in Treatment: 25 ?Visit Information History Since Last Visit ?All ordered tests and consults were completed: No ?Patient Arrived: [name] ?Added or deleted any medications: No ?Arrival Time: 10:56 ?Any new allergies or adverse reactions: No ?Accompanied By: self ?Had a fall or experienced change in No ?Transfer Assistance: None ?activities of daily living that may affect ?Patient Identification Verified: Yes ?risk of falls: ?Secondary Verification Process Completed: Yes ?Signs or symptoms of abuse/neglect since last visito No ?Patient Requires Transmission-Based No ?Hospitalized since last visit: No ?Precautions: ?Implantable device outside of the clinic excluding No ?Patient Has Alerts: Yes ?cellular tissue based products placed in the center ?Patient Alerts: AVVS consult on file ?since last visit: ?Last ABI-R 1.09; L ?Has Dressing in Place as Prescribed: Yes ?1.04 ?Has Compression in Place as Prescribed: Yes ?Pain Present Now: No ?Electronic Signature(s) ?Signed: 07/26/2021 2:47:27 PM By: [name] RN ?Entered By: [name] on 07/26/2021 10:57:41 ?[place], [name] ([id number]) ?-------------------------------------------------------------------------------- ?Clinic Level of Care Assessment Details ?Patient Name: [name] ?Date of Service: 07/26/2021 11:00 AM ?Medical Record Number: [id number] ?Patient Account Number: [id number] ?Date of Birth/Sex: September 04, 1950 (71 y.o. M) ?Treating RN: [name] ?Primary Care Tracie Lindbloom: Felipa Eth Other Clinician: ?Referring Jazzlynn Rawe: Felipa Eth ?Treating Natanya Holecek/Extender: Jeri Cos ?Weeks in Treatment: 25 ?Clinic Level of Care Assessment Items ?TOOL 1 Quantity Score ?'[]'$  - Use when EandM and Procedure is performed on INITIAL visit 0 ?ASSESSMENTS - Nursing Assessment / Reassessment ?'[]'$  - General Physical Exam (combine w/ comprehensive assessment (listed just below) when performed on new ?0 ?pt. evals) ?'[]'$  - 0 ?Comprehensive Assessment (HX, ROS, Risk Assessments, Wounds Hx, etc.) ?ASSESSMENTS - Wound and Skin Assessment / Reassessment ?'[]'$  - Dermatologic / Skin Assessment (not related to wound area) 0 ?ASSESSMENTS - Ostomy and/or Continence Assessment and Care ?'[]'$  - Incontinence Assessment and Management 0 ?'[]'$  - 0 ?Ostomy Care Assessment and Management (repouching, etc.) ?PROCESS - Coordination of Care ?'[]'$  - Simple Patient / Family Education for ongoing care 0 ?'[]'$  - 0 ?Complex (extensive) Patient / Family Education for ongoing care ?'[]'$  - 0 ?Staff obtains Consents, Records, Test Results / Process Orders ?'[]'$  - 0 ?Staff telephones HHA, Nursing Homes / Clarify orders / etc ?'[]'$  - 0 ?Routine Transfer to another Facility (non-emergent condition) ?'[]'$  - 0 ?Routine Hospital Admission (non-emergent condition) ?'[]'$  - 0 ?New Admissions / Biomedical engineer / Ordering NPWT, Apligraf, etc. ?'[]'$  - 0 ?Emergency Hospital Admission (emergent condition) ?PROCESS - Special Needs ?'[]'$  - Pediatric / Minor Patient Management 0 ?'[]'$  - 0 ?Isolation Patient Management ?'[]'$  - 0 ?Hearing / Language / Visual special needs ?'[]'$  - 0 ?Assessment of Community assistance (transportation, D/C planning, etc.) ?'[]'$  - 0 ?Additional assistance / Altered mentation ?'[]'$  - 0 ?Support Surface(s) Assessment (bed, cushion, seat, etc.) ?INTERVENTIONS - Miscellaneous ?'[]'$  - External ear exam 0 ?'[]'$  - 0 ?Patient Transfer (multiple staff / Civil Service fast streamer / Similar devices) ?'[]'$  - 0 ?Simple Staple / Suture removal (25 or less) ?'[]'$  - 0 ?Complex Staple / Suture removal (26 or more) ?'[]'$  - 0 ?Hypo/Hyperglycemic  Management (do not check if billed separately) ?'[]'$  - 0 ?Ankle / Brachial Index (ABI) - do not check if billed separately ?Has the patient been  seen at the hospital within the last three years: Yes ?Total Score: 0 ?Level Of Care: ____ ?[name], [name] ([id number]) ?Electronic Signature(s) ?Signed: 07/26/2021 2:47:27 PM By: [name] RN ?Entered By: [name] on 07/26/2021 11:33:18 ?[name], [name] ([id number]) ?-------------------------------------------------------------------------------- ?Compression Therapy Details ?Patient Name: [name] ?Date of Service: 07/26/2021 11:00 AM ?Medical Record Number: [id number] ?Patient Account Number: [id number] ?Date of Birth/Sex: 10/01/1950 (71 y.o. M) ?Treating RN: [name] ?Primary Care Mahmoud Blazejewski: Felipa Eth Other Clinician: ?Referring Mazy Culton: Felipa Eth ?Treating Vonne Mcdanel/Extender: Jeri Cos ?Weeks in Treatment: 25 ?Compression Therapy Performed for Wound Assessment: NonWound Condition Lymphedema - Left Leg ?Performed By: Clinician [name], RN ?Compression Type: Four Layer ?Post Procedure Diagnosis ?Same as Pre-procedure ?Electronic Signature(s) ?Signed: 07/26/2021 2:47:27 PM By: [name] RN ?Entered By: [name] on 07/26/2021 11:14:00 ?[place], [name] ([id number]) ?-------------------------------------------------------------------------------- ?Compression Therapy Details ?Patient Name: [name] ?Date of Service: 07/26/2021 11:00 AM ?Medical Record Number: [id number] ?Patient Account Number: [id number] ?Date of Birth/Sex: [birth year] (71 y.o. M) ?Treating RN: [name] ?Primary Care Jujhar Everett: Felipa Eth Other Clinician: ?Referring Jonatha Gagen: Felipa Eth ?Treating Niveah Boerner/Extender: Jeri Cos ?Weeks in Treatment: 25 ?Compression Therapy Performed for Wound Assessment: NonWound Condition Lymphedema - Right Leg ?Performed By: Clinician [name], RN ?Compression Type: Four Layer ?Post Procedure Diagnosis ?Same as  Pre-procedure ?Electronic Signature(s) ?Signed: 07/26/2021 2:47:27 PM By: [name] RN ?Entered By: [name] on 07/26/2021 11:14:19 ?[place], [name] ([id number]) ?-------------------------------------------------------------------------------- ?Encounter Discharge Information Details ?Patient Name: JABAREE, MERCADO ?Date of Service: 07/26/2021 11:00 AM ?Medical Record Number: [id number] ?Patient Account Number: [id number] ?Date of Birth/Sex: April 03, 1951 (71 y.o. M) ?Treating RN: [name] ?Primary Care Kelton Bultman: Felipa Eth Other Clinician: ?Referring Celest Reitz: Felipa Eth ?Treating Delina Kruczek/Extender: Jeri Cos ?Weeks in Treatment: 25 ?Encounter Discharge Information Items ?Discharge Condition: Stable ?Ambulatory Status: [name] ?Discharge Destination: Home ?Transportation: Private Auto ?Accompanied By: self ?Schedule Follow-up Appointment: Yes ?Clinical Summary of Care: Patient Declined ?Electronic Signature(s) ?Signed: 07/26/2021 2:47:27 PM By: [name] RN ?Entered By: [name] on 07/26/2021 12:34:56 ?[place], [name] ([id number]) ?-------------------------------------------------------------------------------- ?Lower Extremity Assessment Details ?Patient Name: LITTLETON, HAUB ?Date of Service: 07/26/2021 11:00 AM ?Medical Record Number: [id number] ?Patient Account Number: [id number] ?Date of Birth/Sex: Feb 11, 1951 (71 y.o. M) ?Treating RN: [name] ?Primary Care Tianne Plott: Felipa Eth Other Clinician: ?Referring Allicia Culley: Felipa Eth ?Treating Yarely Bebee/Extender: Jeri Cos ?Weeks in Treatment: 25 ?Edema Assessment ?Assessed: [Left: No] [Right: No] ?Edema: [Left: Yes] [Right: Yes] ?Calf ?Left: Right: ?Point of Measurement: 35 cm From Medial Instep 37 cm 38 cm ?Ankle ?Left: Right: ?Point of Measurement: 10 cm From Medial Instep 31 cm 28 cm ?Vascular Assessment ?Pulses: ?Dorsalis Pedis ?Palpable: [Left:Yes] [Right:Yes] ?Electronic Signature(s) ?Signed: 07/26/2021 2:47:27 PM By: [name]  RN ?Entered By: [name] on 07/26/2021 11:12:35 ?[place], [name] ([id number]) ?-------------------------------------------------------------------------------- ?Multi Wound Chart Details ?Patient Name: ANIBAL, QUINBY ?Date of Service:

## 2021-07-26 NOTE — Progress Notes (Addendum)
[name] ([id number]) ?Visit Report for 07/26/2021 ?Chief Complaint Document Details ?Patient Name: [name], [name] ?Date of Service: 07/26/2021 11:00 AM ?Medical Record Number: [id number] ?Patient Account Number: [id number] ?Date of Birth/Sex: 1951/03/21 (71 y.o. M) ?Treating RN: [name] ?Primary Care Provider: Felipa Eth Other Clinician: ?Referring Provider: Felipa Eth ?Treating Provider/Extender: Jeri Cos ?Weeks in Treatment: 25 ?Information Obtained from: Patient ?Chief Complaint ?Left LE ulcers ?Electronic Signature(s) ?Signed: 07/26/2021 11:04:44 AM By: Worthy Keeler PA-C ?Entered By: Worthy Keeler on 07/26/2021 11:04:44 ?[place], Kasandra Mooney ([id number]) ?-------------------------------------------------------------------------------- ?HPI Details ?Patient Name: [name] ?Date of Service: 07/26/2021 11:00 AM ?Medical Record Number: [id number] ?Patient Account Number: [id number] ?Date of Birth/Sex: 11-Sep-1950 (71 y.o. M) ?Treating RN: [name] ?Primary Care Provider: Felipa Eth Other Clinician: ?Referring Provider: Felipa Eth ?Treating Provider/Extender: Jeri Cos ?Weeks in Treatment: 25 ?History of Present Illness ?HPI Description: 71 year old male who presented to the ER with bilateral lower extremity blisters which had started last week. he has a past ?medical history of leukemia, diabetes mellitus, hypertension, edema of both lower extremities, his recurrent skin infections, peripheral vascular ?disease, coronary artery disease, congestive heart failure and peripheral neuropathy. in the ER he was given Rocephin and put on Silvadene ?cream. he was put on oral doxycycline and was asked to follow-up with the [facility]. ?His last hemoglobin A1c was 6.6 in December and he checks his blood sugar once a week. He does not have any physicians outside the [place] ?system. ?He does not recall any vascular duplex studies done either for arterial or venous disease but was told to wear  compression stockings which he ?does not use ?05/30/2016 -- we have not yet received any of his notes from the [facility] and his arterial and venous duplex studies are scheduled ?here in [place] around mid February. We are unable to have his insurance accepted by home health agencies and hence he is getting dressings ?only once a week. ?06/06/16 -- -- I received a call from the patient's PCP at the [facility] at [facility] and spoke to Dr. Garvin Fila, phone number [phone] and fax ?number [phone]. ?She confirmed that no vascular testing was done over the last 5 years and she would be happy to do them if the patient did want them to be done ?at the [place] and we could fax him a request. ?Readmission: ?71 year old male seen by as in February of this year and was referred to vein and vascular for studies and opinion from the vascular surgeons. ?The patient returns today with a fresh problem having had blisters on his left lower extremity which have been there for about 5 days and he ?clearly states that he has been wearing his compression stockings as advised though he could not read the moderate compression and has been ?wearing light compression. ?Review of his electronic medical records note that he had lower extremity arterial duplex examination done on 06/23/2016 which showed no ?hemodynamically significant stenosis in the bilateral lower extremity arterial system. ?He also had a lower extremity venous reflux examination done on 07/07/2016 and it was noted that he had venous incompetence in the right ?great saphenous vein and bilateral common femoral veins. ?Patient was seen by Dr. Tamala Julian on the same day and for some reason his notes do not reflect the venous studies or the arterial studies and he ?recommended patient do a venous duplex ultrasound to look for reflux and return to see him.he would also consider a lymph pump if required. ?The patient was told that his workup  was normal and hence the patient  canceled his follow-up appointment. ?02/03/17 on evaluation today patient left medial lower extremity blister appears to be doing about the same. It is still continuing to drain and ?there's still the blistered skin covering the wound bed which is making it difficult for the alternate to do its job. Fortunately there is no evidence of ?cellulitis. No fevers chills noted. Patient states in general he is not having any significant discomfort. ?Patient's lower extremity arterial duplex exam revealed that patient was hemodynamically stable with no evidence of stenosis in regard to the ?bilateral lower extremities. ?The lower extremity venous reflux exam revealed the patient had venous incontinence noted in the right greater saphenous and bilateral common ?femoral vein. There is no evidence of deep or superficial vein thrombosis in the bilateral lower extremities. ?Readmission: ?11/12/18 Patient presents for evaluation our clinic today concerning issues that he is having with his left lower extremity. He tells me that a couple ?weeks ago he began developing blisters on the left lower extremity along with increased swelling. He typically wears his compression stockings on ?a regular basis is previously been evaluated both here as well is with vascular surgery they would recommend lymphedema pumps but ?unfortunately that somehow fell through and he never heard anything back from that. Nonetheless I think lymphedema pumps would be beneficial ?for this patient. He does have a history of hypertension and diabetes. Obviously the chronic venous stasis and lymphedema as well. At this point ?the blisters have been given in more trouble he states sometimes when the blisters openings able to clean it down with alcohol and it will dry out ?and do well. Unfortunately that has not been the case this time. He is having some discomfort although this mean these with cleaning the areas ?he doesn't have discomfort just on a regular basis. He  has not been able to wear his compression stockings since the blisters arose due to the fact ?that of course it will drain into the socks causing additional issues and he didn't have any way to wrap this otherwise. He has increased to taking ?his Lasix every day instead of every other day. He sees his primary care provider later this month as well. No fevers, chills, nausea, or vomiting ?noted at this time. ?11/19/18-Patient returns at 1 week, per intake RN the amount of seepage into the compression wraps was definitely improved, overall all the ?wounds are measuring smaller but continuing silver alginate to the wounds as primary dressing ?11/26/18 on evaluation today patient appears to be doing quite well in regard to his left lower [place] ulcers. In fact of the areas that were noted ?initially he only has two regions still open. There is no evidence of active infection at this time. He still is not heard anything from the company ?regarding lymphedema pumps as of yet. Again as previously seen vascular they have not recommended any surgical intervention. ?12/03/2018 on evaluation today patient actually appears to be doing quite well with regard to his lower extremity ulcers. In fact most of the areas ?appear to be healed the one spot which does not seem to be completely healed I am unsure of whether or not this is really draining that much but ?nonetheless there does not appear to be any signs of infection or significant drainage at this point. There is no sign of fever, chills, nausea, ?vomiting, or diarrhea. Overall I am pleased with how things have progressed I think is very close to being able to transition  to his home ?compression stockings. ZAKAR, BROSCH ([id number]) ?12/10/2018 upon evaluation today patient appears to be doing quite well with regard to his left lower extremity. He has been tolerating the dressing ?changes without complication. Fortunately there is no signs of active infection at this time. He  appears after thorough evaluation of his leg to only ?have 1 small area that remains open at this point everything else appears to be almost completely closed. He still have significant swelling of the ?left lo

## 2021-07-30 ENCOUNTER — Ambulatory Visit: Payer: PPO | Admitting: Cardiovascular Disease

## 2021-08-02 ENCOUNTER — Encounter: Payer: PPO | Admitting: Physician Assistant

## 2021-08-02 ENCOUNTER — Other Ambulatory Visit: Payer: Self-pay

## 2021-08-02 DIAGNOSIS — E11622 Type 2 diabetes mellitus with other skin ulcer: Secondary | ICD-10-CM | POA: Diagnosis not present

## 2021-08-02 NOTE — Progress Notes (Addendum)
[place], [name] ([id number]) ?Visit Report for 08/02/2021 ?Chief Complaint Document Details ?Patient Name: [name], [name] ?Date of Service: 08/02/2021 2:00 PM ?Medical Record Number: [id number] ?Patient Account Number: [id number] ?Date of Birth/Sex: Jun 05, 1950 (71 y.o. M) ?Treating RN: Cornell Barman ?Primary Care Provider: Felipa Eth Other Clinician: ?Referring Provider: Felipa Eth ?Treating Provider/Extender: Jeri Cos ?Weeks in Treatment: 26 ?Information Obtained from: Patient ?Chief Complaint ?Left LE ulcers ?Electronic Signature(s) ?Signed: 08/02/2021 2:22:34 PM By: Worthy Keeler PA-C ?Entered By: Worthy Keeler on 08/02/2021 14:22:33 ?[place], [name] ([id number]) ?-------------------------------------------------------------------------------- ?HPI Details ?Patient Name: [name], [name] ?Date of Service: 08/02/2021 2:00 PM ?Medical Record Number: [id number] ?Patient Account Number: [id number] ?Date of Birth/Sex: 10-18-1950 (71 y.o. M) ?Treating RN: Cornell Barman ?Primary Care Provider: Felipa Eth Other Clinician: ?Referring Provider: Felipa Eth ?Treating Provider/Extender: Jeri Cos ?Weeks in Treatment: 26 ?History of Present Illness ?HPI Description: 71 year old male who presented to the ER with bilateral lower extremity blisters which had started last week. he has a past ?medical history of leukemia, diabetes mellitus, hypertension, edema of both lower extremities, his recurrent skin infections, peripheral vascular ?disease, coronary artery disease, congestive heart failure and peripheral neuropathy. in the ER he was given Rocephin and put on Silvadene ?cream. he was put on oral doxycycline and was asked to follow-up with the [facility]. ?His last hemoglobin A1c was 6.6 in December and he checks his blood sugar once a week. He does not have any physicians outside the [place] ?system. ?He does not recall any vascular duplex studies done either for arterial or venous disease but was told to wear compression  stockings which he ?does not use ?05/30/2016 -- we have not yet received any of his notes from the [facility] hospital system and his arterial and venous duplex studies are scheduled ?here in [place] around mid February. We are unable to have his insurance accepted by home health agencies and hence he is getting dressings ?only once a week. ?06/06/16 -- -- I received a call from the patient's PCP at the [facility] at [facility] and spoke to Dr. Garvin Fila, phone number [phone] and fax ?number [phone]. ?She confirmed that no vascular testing was done over the last 5 years and she would be happy to do them if the patient did want them to be done ?at the [place] and we could fax him a request. ?Readmission: ?71 year old male seen by as in February of this year and was referred to vein and vascular for studies and opinion from the vascular surgeons. ?The patient returns today with a fresh problem having had blisters on his left lower extremity which have been there for about 5 days and he ?clearly states that he has been wearing his compression stockings as advised though he could not read the moderate compression and has been ?wearing light compression. ?Review of his electronic medical records note that he had lower extremity arterial duplex examination done on 06/23/2016 which showed no ?hemodynamically significant stenosis in the bilateral lower extremity arterial system. ?He also had a lower extremity venous reflux examination done on 07/07/2016 and it was noted that he had venous incompetence in the right ?great saphenous vein and bilateral common femoral veins. ?Patient was seen by Dr. Tamala Julian on the same day and for some reason his notes do not reflect the venous studies or the arterial studies and he ?recommended patient do a venous duplex ultrasound to look for reflux and return to see him.he would also consider a lymph pump if required. ?The patient was told that his workup  was normal and hence the patient canceled his  follow-up appointment. ?02/03/17 on evaluation today patient left medial lower extremity blister appears to be doing about the same. It is still continuing to drain and ?there's still the blistered skin covering the wound bed which is making it difficult for the alternate to do its job. Fortunately there is no evidence of ?cellulitis. No fevers chills noted. Patient states in general he is not having any significant discomfort. ?Patient's lower extremity arterial duplex exam revealed that patient was hemodynamically stable with no evidence of stenosis in regard to the ?bilateral lower extremities. ?The lower extremity venous reflux exam revealed the patient had venous incontinence noted in the right greater saphenous and bilateral common ?femoral vein. There is no evidence of deep or superficial vein thrombosis in the bilateral lower extremities. ?Readmission: ?11/12/18 Patient presents for evaluation our clinic today concerning issues that he is having with his left lower extremity. He tells me that a couple ?weeks ago he began developing blisters on the left lower extremity along with increased swelling. He typically wears his compression stockings on ?a regular basis is previously been evaluated both here as well is with vascular surgery they would recommend lymphedema pumps but ?unfortunately that somehow fell through and he never heard anything back from that. Nonetheless I think lymphedema pumps would be beneficial ?for this patient. He does have a history of hypertension and diabetes. Obviously the chronic venous stasis and lymphedema as well. At this point ?the blisters have been given in more trouble he states sometimes when the blisters openings able to clean it down with alcohol and it will dry out ?and do well. Unfortunately that has not been the case this time. He is having some discomfort although this mean these with cleaning the areas ?he doesn't have discomfort just on a regular basis. He has not been  able to wear his compression stockings since the blisters arose due to the fact ?that of course it will drain into the socks causing additional issues and he didn't have any way to wrap this otherwise. He has increased to taking ?his Lasix every day instead of every other day. He sees his primary care provider later this month as well. No fevers, chills, nausea, or vomiting ?noted at this time. ?11/19/18-Patient returns at 1 week, per intake RN the amount of seepage into the compression wraps was definitely improved, overall all the ?wounds are measuring smaller but continuing silver alginate to the wounds as primary dressing ?11/26/18 on evaluation today patient appears to be doing quite well in regard to his left lower Trinity ulcers. In fact of the areas that were noted ?initially he only has two regions still open. There is no evidence of active infection at this time. He still is not heard anything from the company ?regarding lymphedema pumps as of yet. Again as previously seen vascular they have not recommended any surgical intervention. ?12/03/2018 on evaluation today patient actually appears to be doing quite well with regard to his lower extremity ulcers. In fact most of the areas ?appear to be healed the one spot which does not seem to be completely healed I am unsure of whether or not this is really draining that much but ?nonetheless there does not appear to be any signs of infection or significant drainage at this point. There is no sign of fever, chills, nausea, ?vomiting, or diarrhea. Overall I am pleased with how things have progressed I think is very close to being able to transition  to his home ?compression stockings. [name], [name] ([id number]) ?12/10/2018 upon evaluation today patient appears to be doing quite well with regard to his left lower extremity. He has been tolerating the dressing ?changes without complication. Fortunately there is no signs of active infection at this time. He appears after  thorough evaluation of his leg to only ?have 1 small area that remains open at this point everything else appears to be almost completely closed. He still have significant swelling of the ?left lower ext

## 2021-08-02 NOTE — Progress Notes (Signed)
[place], [name] ([id number]) ?Visit Report for 08/02/2021 ?Arrival Information Details ?Patient Name: [name], [name] ?Date of Service: 08/02/2021 2:00 PM ?Medical Record Number: [id number] ?Patient Account Number: [id number] ?Date of Birth/Sex: 09-06-1950 (71 y.o. M) ?Treating RN: Cornell Barman ?Primary Care Lailee Hoelzel: Felipa Eth Other Clinician: ?Referring Hanna Ra: Felipa Eth ?Treating Deborra Phegley/Extender: Jeri Cos ?Weeks in Treatment: 26 ?Visit Information History Since Last Visit ?Added or deleted any medications: No ?Patient Arrived: Ambulatory ?Has Dressing in Place as Prescribed: Yes ?Arrival Time: 14:05 ?Has Compression in Place as Prescribed: Yes ?Accompanied By: self ?Pain Present Now: No ?Transfer Assistance: None ?Patient Identification Verified: Yes ?Secondary Verification Process Completed: Yes ?Patient Requires Transmission-Based No ?Precautions: ?Patient Has Alerts: Yes ?Patient Alerts: AVVS consult on file ?Last ABI-R 1.09; L ?1.04 ?Electronic Signature(s) ?Signed: 08/02/2021 4:05:39 PM By: Gretta Cool, BSN, RN, CWS, Kim RN, BSN ?Entered By: Gretta Cool, BSN, RN, CWS, Kim on 08/02/2021 14:08:38 ?[place], [name] ([id number]) ?-------------------------------------------------------------------------------- ?Encounter Discharge Information Details ?Patient Name: [name], [name] ?Date of Service: 08/02/2021 2:00 PM ?Medical Record Number: [id number] ?Patient Account Number: [id number] ?Date of Birth/Sex: 11-11-1950 (71 y.o. M) ?Treating RN: Cornell Barman ?Primary Care Jaleen Finch: Felipa Eth Other Clinician: ?Referring Merick Kelleher: Felipa Eth ?Treating Vahe Pienta/Extender: Jeri Cos ?Weeks in Treatment: 26 ?Encounter Discharge Information Items ?Discharge Condition: Stable ?Ambulatory Status: Ambulatory ?Discharge Destination: Home ?Transportation: Private Auto ?Schedule Follow-up Appointment: Yes ?Clinical Summary of Care: ?Electronic Signature(s) ?Signed: 08/02/2021 4:05:39 PM By: Gretta Cool, BSN, RN, CWS, Kim RN,  BSN ?Entered By: Gretta Cool, BSN, RN, CWS, Kim on 08/02/2021 14:51:33 ?[place], [name] ([id number]) ?-------------------------------------------------------------------------------- ?Lower Extremity Assessment Details ?Patient Name: [name], [name] ?Date of Service: 08/02/2021 2:00 PM ?Medical Record Number: [id number] ?Patient Account Number: [id number] ?Date of Birth/Sex: [birth year] (71 y.o. M) ?Treating RN: Cornell Barman ?Primary Care Dallana Mavity: Felipa Eth Other Clinician: ?Referring Altheia Shafran: Felipa Eth ?Treating Aarin Bluett/Extender: Jeri Cos ?Weeks in Treatment: 26 ?Edema Assessment ?Assessed: [Left: No] [Right: No] ?[Left: Edema] [Right: :] ?Calf ?Left: Right: ?Point of Measurement: 35 cm From Medial Instep 38.5 cm 39.5 cm ?Ankle ?Left: Right: ?Point of Measurement: 10 cm From Medial Instep 38 cm 28.5 cm ?Vascular Assessment ?Pulses: ?Dorsalis Pedis ?Palpable: [Left:Yes] [Right:Yes] ?Electronic Signature(s) ?Signed: 08/02/2021 4:05:39 PM By: Gretta Cool, BSN, RN, CWS, Kim RN, BSN ?Entered By: Gretta Cool, BSN, RN, CWS, Kim on 08/02/2021 14:20:59 ?[place], [name] ([id number]) ?-------------------------------------------------------------------------------- ?Multi Wound Chart Details ?Patient Name: [name], [name] ?Date of Service: 08/02/2021 2:00 PM ?Medical Record Number: [id number] ?Patient Account Number: [id number] ?Date of Birth/Sex: 06/15/1950 (71 y.o. M) ?Treating RN: Cornell Barman ?Primary Care Adron Geisel: Felipa Eth Other Clinician: ?Referring Zollie Clemence: Felipa Eth ?Treating Trysta Showman/Extender: Jeri Cos ?Weeks in Treatment: 26 ?Vital Signs ?Height(in): 73 ?Pulse(bpm): 74 ?Weight(lbs): 312 ?Blood Pressure(mmHg): 100/60 ?Body Mass Index(BMI): 41.2 ?Temperature(??F): 98 ?Respiratory Rate(breaths/min): 18 ?Wound Assessments ?Treatment Notes ?Electronic Signature(s) ?Signed: 08/02/2021 4:05:39 PM By: Gretta Cool, BSN, RN, CWS, Kim RN, BSN ?Entered By: Gretta Cool, BSN, RN, CWS, Kim on 08/02/2021 14:24:57 ?[place], [name]  ([id number]) ?-------------------------------------------------------------------------------- ?Multi-Disciplinary Care Plan Details ?Patient Name: [name], [name] ?Date of Service: 08/02/2021 2:00 PM ?Medical Record Number: [id number] ?Patient Account Number: [id number] ?Date of Birth/Sex: 03/02/1951 (71 y.o. M) ?Treating RN: Cornell Barman ?Primary Care Jadyn Barge: Felipa Eth Other Clinician: ?Referring Maloree Uplinger: Felipa Eth ?Treating Demesha Boorman/Extender: Jeri Cos ?Weeks in Treatment: 26 ?Active Inactive ?Electronic Signature(s) ?Signed: 08/02/2021 4:05:39 PM By: Gretta Cool, BSN, RN, CWS, Kim RN, BSN ?Entered By: Gretta Cool, BSN, RN, CWS, Kim on 08/02/2021 14:24:40 ?[place], [name] ([id number]) ?-------------------------------------------------------------------------------- ?Pain Assessment Details ?Patient Name: [name], [name] ?Date of Service: 08/02/2021 2:00 PM ?Medical Record Number: [id number] ?Patient Account Number: [id number] ?Date  of Birth/Sex: [birth year] (71 y.o. M) ?Treating RN: Cornell Barman ?Primary Care Puanani Gene: Felipa Eth Other Clinician: ?Referring Gleason Ardoin: Felipa Eth ?Treating Sharnese Heath/Extender: Jeri Cos ?Weeks in Treatment: 26 ?Active Problems ?Location of Pain Severity and Description of Pain ?Patient Has Paino No ?Site Locations ?Pain Management and Medication ?Current Pain Management: ?Notes ?Patient denies pain at this time. ?Electronic Signature(s) ?Signed: 08/02/2021 4:05:39 PM By: Gretta Cool, BSN, RN, CWS, Kim RN, BSN ?Entered By: Gretta Cool, BSN, RN, CWS, Kim on 08/02/2021 14:09:31 ?[place], [name] ([id number]) ?-------------------------------------------------------------------------------- ?Patient/Caregiver Education Details ?Patient Name: [name], [name] ?Date of Service: 08/02/2021 2:00 PM ?Medical Record Number: [id number] ?Patient Account Number: [id number] ?Date of Birth/Gender: March 04, 1951 (71 y.o. M) ?Treating RN: Cornell Barman ?Primary Care Physician: Felipa Eth Other Clinician: ?Referring Physician:  Felipa Eth ?Treating Physician/Extender: Jeri Cos ?Weeks in Treatment: 26 ?Education Assessment ?Education Provided To: ?Patient ?Education Topics Provided ?Venous: ?Handouts: Controlling Swelling with Multilayered Compression Wraps ?Methods: Demonstration, Explain/Verbal ?Responses: State content correctly ?Electronic Signature(s) ?Signed: 08/02/2021 4:05:39 PM By: Gretta Cool, BSN, RN, CWS, Kim RN, BSN ?Entered By: Gretta Cool, BSN, RN, CWS, Kim on 08/02/2021 14:50:34 ?[place], [name] ([id number]) ?-------------------------------------------------------------------------------- ?Vitals Details ?Patient Name: [name], [name] ?Date of Service: 08/02/2021 2:00 PM ?Medical Record Number: [id number] ?Patient Account Number: [id number] ?Date of Birth/Sex: 04-Mar-1951 (71 y.o. M) ?Treating RN: Cornell Barman ?Primary Care Aarin Bluett: Felipa Eth Other Clinician: ?Referring Amyrah Pinkhasov: Felipa Eth ?Treating Dutch Ing/Extender: Jeri Cos ?Weeks in Treatment: 26 ?Vital Signs ?Time Taken: 14:08 ?Temperature (??F): 98 ?Height (in): 73 ?Pulse (bpm): 74 ?Weight (lbs): 312 ?Respiratory Rate (breaths/min): 18 ?Body Mass Index (BMI): 41.2 ?Blood Pressure (mmHg): 100/60 ?Reference Range: 80 - 120 mg / dl ?Electronic Signature(s) ?Signed: 08/02/2021 4:05:39 PM By: Gretta Cool, BSN, RN, CWS, Kim RN, BSN ?Entered By: Gretta Cool, BSN, RN, CWS, Kim on 08/02/2021 14:09:05 ?

## 2021-08-09 ENCOUNTER — Encounter: Payer: PPO | Attending: Physician Assistant | Admitting: Physician Assistant

## 2021-08-09 DIAGNOSIS — E1142 Type 2 diabetes mellitus with diabetic polyneuropathy: Secondary | ICD-10-CM | POA: Insufficient documentation

## 2021-08-09 DIAGNOSIS — L97811 Non-pressure chronic ulcer of other part of right lower leg limited to breakdown of skin: Secondary | ICD-10-CM | POA: Insufficient documentation

## 2021-08-09 DIAGNOSIS — E11622 Type 2 diabetes mellitus with other skin ulcer: Secondary | ICD-10-CM | POA: Insufficient documentation

## 2021-08-09 DIAGNOSIS — Z79899 Other long term (current) drug therapy: Secondary | ICD-10-CM | POA: Diagnosis not present

## 2021-08-09 DIAGNOSIS — I872 Venous insufficiency (chronic) (peripheral): Secondary | ICD-10-CM | POA: Diagnosis not present

## 2021-08-09 DIAGNOSIS — Z856 Personal history of leukemia: Secondary | ICD-10-CM | POA: Insufficient documentation

## 2021-08-09 DIAGNOSIS — L97512 Non-pressure chronic ulcer of other part of right foot with fat layer exposed: Secondary | ICD-10-CM | POA: Insufficient documentation

## 2021-08-09 DIAGNOSIS — I890 Lymphedema, not elsewhere classified: Secondary | ICD-10-CM | POA: Insufficient documentation

## 2021-08-09 DIAGNOSIS — L97822 Non-pressure chronic ulcer of other part of left lower leg with fat layer exposed: Secondary | ICD-10-CM | POA: Diagnosis not present

## 2021-08-09 DIAGNOSIS — I5040 Unspecified combined systolic (congestive) and diastolic (congestive) heart failure: Secondary | ICD-10-CM | POA: Insufficient documentation

## 2021-08-09 DIAGNOSIS — I110 Hypertensive heart disease with heart failure: Secondary | ICD-10-CM | POA: Insufficient documentation

## 2021-08-09 DIAGNOSIS — I2510 Atherosclerotic heart disease of native coronary artery without angina pectoris: Secondary | ICD-10-CM | POA: Insufficient documentation

## 2021-08-09 NOTE — Progress Notes (Addendum)
[place], [name] ([id number]) ?Visit Report for 08/09/2021 ?Chief Complaint Document Details ?Patient Name: [name], [name] ?Date of Service: 08/09/2021 2:00 PM ?Medical Record Number: [id number] ?Patient Account Number: [id number] ?Date of Birth/Sex: 04-08-1951 (71 y.o. M) ?Treating RN: [name] ?Primary Care Provider: Felipa Eth Other Clinician: ?Referring Provider: Felipa Eth ?Treating Provider/Extender: Jeri Cos ?Weeks in Treatment: 27 ?Information Obtained from: Patient ?Chief Complaint ?Left LE ulcers ?Electronic Signature(s) ?Signed: 08/09/2021 2:16:24 PM By: Worthy Keeler PA-C ?Entered By: Worthy Keeler on 08/09/2021 14:16:24 ?[place], [name] ([id number]) ?-------------------------------------------------------------------------------- ?HPI Details ?Patient Name: Mooney, [name] ?Date of Service: 08/09/2021 2:00 PM ?Medical Record Number: [id number] ?Patient Account Number: [id number] ?Date of Birth/Sex: [birth year] (71 y.o. M) ?Treating RN: [name] ?Primary Care Provider: Felipa Eth Other Clinician: ?Referring Provider: Felipa Eth ?Treating Provider/Extender: Jeri Cos ?Weeks in Treatment: 27 ?History of Present Illness ?HPI Description: 71 year old male who presented to the ER with bilateral lower extremity blisters which had started last week. he has a past ?medical history of leukemia, diabetes mellitus, hypertension, edema of both lower extremities, his recurrent skin infections, peripheral vascular ?disease, coronary artery disease, congestive heart failure and peripheral neuropathy. in the ER he was given Rocephin and put on Silvadene ?cream. he was put on oral doxycycline and was asked to follow-up with the [facility]. ?His last hemoglobin A1c was 6.6 in December and he checks his blood sugar once a week. He does not have any physicians outside the [place] ?system. ?He does not recall any vascular duplex studies done either for arterial or venous disease but was told to wear compression  stockings which he ?does not use ?05/30/2016 -- we have not yet received any of his notes from the [facility] hospital system and his arterial and venous duplex studies are scheduled ?here in [place] around mid February. We are unable to have his insurance accepted by home health agencies and hence he is getting dressings ?only once a week. ?06/06/16 -- -- I received a call from the patient's PCP at the [facility] at [facility] and spoke to Dr. Garvin Fila, phone number [phone] and fax ?number [phone]. ?She confirmed that no vascular testing was done over the last 5 years and she would be happy to do them if the patient did want them to be done ?at the [place] and we could fax him a request. ?Readmission: ?72 year old male seen by as in February of this year and was referred to vein and vascular for studies and opinion from the vascular surgeons. ?The patient returns today with a fresh problem having had blisters on his left lower extremity which have been there for about 5 days and he ?clearly states that he has been wearing his compression stockings as advised though he could not read the moderate compression and has been ?wearing light compression. ?Review of his electronic medical records note that he had lower extremity arterial duplex examination done on 06/23/2016 which showed no ?hemodynamically significant stenosis in the bilateral lower extremity arterial system. ?He also had a lower extremity venous reflux examination done on 07/07/2016 and it was noted that he had venous incompetence in the right ?great saphenous vein and bilateral common femoral veins. ?Patient was seen by Dr. Tamala Julian on the same day and for some reason his notes do not reflect the venous studies or the arterial studies and he ?recommended patient do a venous duplex ultrasound to look for reflux and return to see him.he would also consider a lymph pump if required. ?The patient was told that his workup  was normal and hence the patient canceled his  follow-up appointment. ?02/03/17 on evaluation today patient left medial lower extremity blister appears to be doing about the same. It is still continuing to drain and ?there's still the blistered skin covering the wound bed which is making it difficult for the alternate to do its job. Fortunately there is no evidence of ?cellulitis. No fevers chills noted. Patient states in general he is not having any significant discomfort. ?Patient's lower extremity arterial duplex exam revealed that patient was hemodynamically stable with no evidence of stenosis in regard to the ?bilateral lower extremities. ?The lower extremity venous reflux exam revealed the patient had venous incontinence noted in the right greater saphenous and bilateral common ?femoral vein. There is no evidence of deep or superficial vein thrombosis in the bilateral lower extremities. ?Readmission: ?11/12/18 Patient presents for evaluation our clinic today concerning issues that he is having with his left lower extremity. He tells me that a couple ?weeks ago he began developing blisters on the left lower extremity along with increased swelling. He typically wears his compression stockings on ?a regular basis is previously been evaluated both here as well is with vascular surgery they would recommend lymphedema pumps but ?unfortunately that somehow fell through and he never heard anything back from that. Nonetheless I think lymphedema pumps would be beneficial ?for this patient. He does have a history of hypertension and diabetes. Obviously the chronic venous stasis and lymphedema as well. At this point ?the blisters have been given in more trouble he states sometimes when the blisters openings able to clean it down with alcohol and it will dry out ?and do well. Unfortunately that has not been the case this time. He is having some discomfort although this mean these with cleaning the areas ?he doesn't have discomfort just on a regular basis. He has not been  able to wear his compression stockings since the blisters arose due to the fact ?that of course it will drain into the socks causing additional issues and he didn't have any way to wrap this otherwise. He has increased to taking ?his Lasix every day instead of every other day. He sees his primary care provider later this month as well. No fevers, chills, nausea, or vomiting ?noted at this time. ?11/19/18-Patient returns at 1 week, per intake RN the amount of seepage into the compression wraps was definitely improved, overall all the ?wounds are measuring smaller but continuing silver alginate to the wounds as primary dressing ?11/26/18 on evaluation today patient appears to be doing quite well in regard to his left lower Trinity ulcers. In fact of the areas that were noted ?initially he only has two regions still open. There is no evidence of active infection at this time. He still is not heard anything from the company ?regarding lymphedema pumps as of yet. Again as previously seen vascular they have not recommended any surgical intervention. ?12/03/2018 on evaluation today patient actually appears to be doing quite well with regard to his lower extremity ulcers. In fact most of the areas ?appear to be healed the one spot which does not seem to be completely healed I am unsure of whether or not this is really draining that much but ?nonetheless there does not appear to be any signs of infection or significant drainage at this point. There is no sign of fever, chills, nausea, ?vomiting, or diarrhea. Overall I am pleased with how things have progressed I think is very close to being able to transition  to his home ?compression stockings. MACK, ALVIDREZ ([id number]) ?12/10/2018 upon evaluation today patient appears to be doing quite well with regard to his left lower extremity. He has been tolerating the dressing ?changes without complication. Fortunately there is no signs of active infection at this time. He appears after  thorough evaluation of his leg to only ?have 1 small area that remains open at this point everything else appears to be almost completely closed. He still have significant swelling of the ?left lower ext

## 2021-08-10 NOTE — Progress Notes (Signed)
[place], [name] ([id number]) ?Visit Report for 08/09/2021 ?Arrival Information Details ?Patient Name: [name], [name] ?Date of Service: 08/09/2021 2:00 PM ?Medical Record Number: [id number] ?Patient Account Number: [id number] ?Date of Birth/Sex: Jun 14, 1950 (71 y.o. M) ?Treating RN: [name] ?Primary Care Amyra Vantuyl: Felipa Eth Other Clinician: ?Referring Angeles Paolucci: Felipa Eth ?Treating Tyrianna Lightle/Extender: [name] ?Weeks in Treatment: 27 ?Visit Information History Since Last Visit ?All ordered tests and consults were completed: No ?Patient Arrived: [name] ?Added or deleted any medications: No ?Arrival Time: 14:03 ?Any new allergies or adverse reactions: No ?Accompanied By: self ?Had a fall or experienced change in No ?Transfer Assistance: None ?activities of daily living that may affect ?Patient Identification Verified: Yes ?risk of falls: ?Secondary Verification Process Completed: Yes ?Signs or symptoms of abuse/neglect since last visito No ?Patient Requires Transmission-Based No ?Hospitalized since last visit: No ?Precautions: ?Implantable device outside of the clinic excluding No ?Patient Has Alerts: Yes ?cellular tissue based products placed in the center ?Patient Alerts: AVVS consult on file ?since last visit: ?Last ABI-R 1.09; L ?Has Dressing in Place as Prescribed: Yes ?1.04 ?Has Compression in Place as Prescribed: Yes ?Pain Present Now: No ?Electronic Signature(s) ?Signed: 08/10/2021 9:58:52 AM By: [name] RN ?Entered By: [name] on 08/09/2021 14:04:57 ?[place], [name] ([id number]) ?-------------------------------------------------------------------------------- ?Clinic Level of Care Assessment Details ?Patient Name: [name], [name] ?Date of Service: 08/09/2021 2:00 PM ?Medical Record Number: [id number] ?Patient Account Number: [id number] ?Date of Birth/Sex: [birth year] (71 y.o. M) ?Treating RN: [name] ?Primary Care Shavona Gunderman: Felipa Eth Other Clinician: ?Referring Lanesha Azzaro: Felipa Eth ?Treating Jenniffer Vessels/Extender: [name] ?Weeks in Treatment: 27 ?Clinic Level of Care Assessment Items ?TOOL 1 Quantity Score ?'[]'$  - Use when EandM and Procedure is performed on INITIAL visit 0 ?ASSESSMENTS - Nursing Assessment / Reassessment ?'[]'$  - General Physical Exam (combine w/ comprehensive assessment (listed just below) when performed on new ?0 ?pt. evals) ?'[]'$  - 0 ?Comprehensive Assessment (HX, ROS, Risk Assessments, Wounds Hx, etc.) ?ASSESSMENTS - Wound and Skin Assessment / Reassessment ?'[]'$  - Dermatologic / Skin Assessment (not related to wound area) 0 ?ASSESSMENTS - Ostomy and/or Continence Assessment and Care ?'[]'$  - Incontinence Assessment and Management 0 ?'[]'$  - 0 ?Ostomy Care Assessment and Management (repouching, etc.) ?PROCESS - Coordination of Care ?'[]'$  - Simple Patient / Family Education for ongoing care 0 ?'[]'$  - 0 ?Complex (extensive) Patient / Family Education for ongoing care ?'[]'$  - 0 ?Staff obtains Consents, Records, Test Results / Process Orders ?'[]'$  - 0 ?Staff telephones HHA, Nursing Homes / Clarify orders / etc ?'[]'$  - 0 ?Routine Transfer to another Facility (non-emergent condition) ?'[]'$  - 0 ?Routine Hospital Admission (non-emergent condition) ?'[]'$  - 0 ?New Admissions / Biomedical engineer / Ordering NPWT, Apligraf, etc. ?'[]'$  - 0 ?Emergency Hospital Admission (emergent condition) ?PROCESS - Special Needs ?'[]'$  - Pediatric / Minor Patient Management 0 ?'[]'$  - 0 ?Isolation Patient Management ?'[]'$  - 0 ?Hearing / Language / Visual special needs ?'[]'$  - 0 ?Assessment of Community assistance (transportation, D/C planning, etc.) ?'[]'$  - 0 ?Additional assistance / Altered mentation ?'[]'$  - 0 ?Support Surface(s) Assessment (bed, cushion, seat, etc.) ?INTERVENTIONS - Miscellaneous ?'[]'$  - External ear exam 0 ?'[]'$  - 0 ?Patient Transfer (multiple staff / Civil Service fast streamer / Similar devices) ?'[]'$  - 0 ?Simple Staple / Suture removal (25 or less) ?'[]'$  - 0 ?Complex Staple / Suture removal (26 or more) ?'[]'$  - 0 ?Hypo/Hyperglycemic  Management (do not check if billed separately) ?'[]'$  - 0 ?Ankle / Brachial Index (ABI) - do not check if billed separately ?Has the patient been  seen at the hospital within the last three years: Yes ?Total Score: 0 ?Level Of Care: ____ ?[place], [name] ([id number]) ?Electronic Signature(s) ?Signed: 08/10/2021 9:58:52 AM By: [name] RN ?Entered By: [name] on 08/09/2021 14:54:46 ?[place], [name] ([id number]) ?-------------------------------------------------------------------------------- ?Compression Therapy Details ?Patient Name: [name], [name] ?Date of Service: 08/09/2021 2:00 PM ?Medical Record Number: [id number] ?Patient Account Number: [id number] ?Date of Birth/Sex: Jul 25, 1950 (71 y.o. M) ?Treating RN: [name] ?Primary Care Saryiah Bencosme: Felipa Eth Other Clinician: ?Referring Kassadie Pancake: Felipa Eth ?Treating Galvin Aversa/Extender: [name] ?Weeks in Treatment: 27 ?Compression Therapy Performed for Wound Assessment: NonWound Condition Lymphedema - Left Leg ?Performed By: Clinician [name], RN ?Compression Type: Four Layer ?Post Procedure Diagnosis ?Same as Pre-procedure ?Electronic Signature(s) ?Signed: 08/10/2021 9:58:52 AM By: [name] RN ?Entered By: [name] on 08/09/2021 14:23:52 ?[place], [name] ([id number]) ?-------------------------------------------------------------------------------- ?Compression Therapy Details ?Patient Name: [name], [name] ?Date of Service: 08/09/2021 2:00 PM ?Medical Record Number: [id number] ?Patient Account Number: [id number] ?Date of Birth/Sex: [birth year] (71 y.o. M) ?Treating RN: [name] ?Primary Care Imri Lor: Felipa Eth Other Clinician: ?Referring Yuli Lanigan: Felipa Eth ?Treating Keiara Sneeringer/Extender: [name] ?Weeks in Treatment: 27 ?Compression Therapy Performed for Wound Assessment: NonWound Condition Lymphedema - Right Leg ?Performed By: Clinician [name], RN ?Compression Type: Four Layer ?Post Procedure Diagnosis ?Same as  Pre-procedure ?Electronic Signature(s) ?Signed: 08/10/2021 9:58:52 AM By: [name] RN ?Entered ByCarlene Coria on 08/09/2021 14:24:23 ?[place], [name] ([id number]) ?-------------------------------------------------------------------------------- ?Encounter Discharge Information Details ?Patient Name: [name], [name] ?Date of Service: 08/09/2021 2:00 PM ?Medical Record Number: [id number] ?Patient Account Number: [id number] ?Date of Birth/Sex: October 10, 1950 (71 y.o. M) ?Treating RN: [name] ?Primary Care Denson Niccoli: Felipa Eth Other Clinician: ?Referring Azka Steger: Felipa Eth ?Treating Anav Lammert/Extender: [name] ?Weeks in Treatment: 27 ?Encounter Discharge Information Items ?Discharge Condition: Stable ?Ambulatory Status: [name] ?Discharge Destination: Home ?Transportation: Private Auto ?Accompanied By: self ?Schedule Follow-up Appointment: Yes ?Clinical Summary of Care: Patient Declined ?Electronic Signature(s) ?Signed: 08/10/2021 9:58:52 AM By: [name] RN ?Entered By: [name] on 08/09/2021 14:55:58 ?[place], [name] ([id number]) ?-------------------------------------------------------------------------------- ?Lower Extremity Assessment Details ?Patient Name: [name], [name] ?Date of Service: 08/09/2021 2:00 PM ?Medical Record Number: [id number] ?Patient Account Number: [id number] ?Date of Birth/Sex: March 17, 1951 (70 y.o. M) ?Treating RN: [name] ?Primary Care Nimah Uphoff: Felipa Eth Other Clinician: ?Referring Raiden Yearwood: Felipa Eth ?Treating Emaya Preston/Extender: [name] ?Weeks in Treatment: 27 ?Edema Assessment ?Assessed: [Left: No] [Right: No] ?Edema: [Left: Yes] [Right: Yes] ?Calf ?Left: Right: ?Point of Measurement: 35 cm From Medial Instep 37 cm 38 cm ?Ankle ?Left: Right: ?Point of Measurement: 10 cm From Medial Instep 38 cm 27 cm ?Vascular Assessment ?Pulses: ?Dorsalis Pedis ?Palpable: [Left:Yes] [Right:Yes] ?Electronic Signature(s) ?Signed: 08/10/2021 9:58:52 AM By: [name] RN ?Entered By:  [name] on 08/09/2021 14:18:09 ?[place], [name] ([id number]) ?-------------------------------------------------------------------------------- ?Multi Wound Chart Details ?Patient Name: [name], [name] ?Date of Service: 08/09/2021 2:00 PM ?M

## 2021-08-16 ENCOUNTER — Encounter: Payer: PPO | Admitting: Physician Assistant

## 2021-08-16 DIAGNOSIS — E11622 Type 2 diabetes mellitus with other skin ulcer: Secondary | ICD-10-CM | POA: Diagnosis not present

## 2021-08-17 NOTE — Progress Notes (Signed)
[place], [name] ([id number]) ?Visit Report for 08/16/2021 ?Arrival Information Details ?Patient Name: [name], [name] ?Date of Service: 08/16/2021 2:00 PM ?Medical Record Number: [id number] ?Patient Account Number: [id number] ?Date of Birth/Sex: 11-04-1950 (71 y.o. M) ?Treating RN: [name] ?Primary Care Cincere Zorn: Felipa Eth Other Clinician: ?Referring Johannah Rozas: Felipa Eth ?Treating Sinda Leedom/Extender: Jeri Cos ?Weeks in Treatment: 28 ?Visit Information History Since Last Visit ?All ordered tests and consults were completed: No ?Patient Arrived: Ambulatory ?Added or deleted any medications: No ?Arrival Time: 14:02 ?Any new allergies or adverse reactions: No ?Accompanied By: self ?Had a fall or experienced change in No ?Transfer Assistance: None ?activities of daily living that may affect ?Patient Identification Verified: Yes ?risk of falls: ?Secondary Verification Process Completed: Yes ?Signs or symptoms of abuse/neglect since last visito No ?Patient Requires Transmission-Based No ?Hospitalized since last visit: No ?Precautions: ?Implantable device outside of the clinic excluding No ?Patient Has Alerts: Yes ?cellular tissue based products placed in the center ?Patient Alerts: AVVS consult on file ?since last visit: ?Last ABI-R 1.09; L ?Has Dressing in Place as Prescribed: Yes ?1.04 ?Has Compression in Place as Prescribed: Yes ?Pain Present Now: No ?Electronic Signature(s) ?Signed: 08/17/2021 8:57:31 AM By: [name] RN ?Entered By: [name] on 08/16/2021 14:06:56 ?[place], [name] ([id number]) ?-------------------------------------------------------------------------------- ?Clinic Level of Care Assessment Details ?Patient Name: [name], [name] ?Date of Service: 08/16/2021 2:00 PM ?Medical Record Number: [id number] ?Patient Account Number: [id number] ?Date of Birth/Sex: 09/09/1950 (71 y.o. M) ?Treating RN: [name] ?Primary Care Deniro Laymon: Felipa Eth Other Clinician: ?Referring Oluwadarasimi Favor: Felipa Eth ?Treating Mandi Mattioli/Extender: Jeri Cos ?Weeks in Treatment: 28 ?Clinic Level of Care Assessment Items ?TOOL 1 Quantity Score ?'[]'$  - Use when EandM and Procedure is performed on INITIAL visit 0 ?ASSESSMENTS - Nursing Assessment / Reassessment ?'[]'$  - General Physical Exam (combine w/ comprehensive assessment (listed just below) when performed on new ?0 ?pt. evals) ?'[]'$  - 0 ?Comprehensive Assessment (HX, ROS, Risk Assessments, Wounds Hx, etc.) ?ASSESSMENTS - Wound and Skin Assessment / Reassessment ?'[]'$  - Dermatologic / Skin Assessment (not related to wound area) 0 ?ASSESSMENTS - Ostomy and/or Continence Assessment and Care ?'[]'$  - Incontinence Assessment and Management 0 ?'[]'$  - 0 ?Ostomy Care Assessment and Management (repouching, etc.) ?PROCESS - Coordination of Care ?'[]'$  - Simple Patient / Family Education for ongoing care 0 ?'[]'$  - 0 ?Complex (extensive) Patient / Family Education for ongoing care ?'[]'$  - 0 ?Staff obtains Consents, Records, Test Results / Process Orders ?'[]'$  - 0 ?Staff telephones HHA, Nursing Homes / Clarify orders / etc ?'[]'$  - 0 ?Routine Transfer to another Facility (non-emergent condition) ?'[]'$  - 0 ?Routine Hospital Admission (non-emergent condition) ?'[]'$  - 0 ?New Admissions / Biomedical engineer / Ordering NPWT, Apligraf, etc. ?'[]'$  - 0 ?Emergency Hospital Admission (emergent condition) ?PROCESS - Special Needs ?'[]'$  - Pediatric / Minor Patient Management 0 ?'[]'$  - 0 ?Isolation Patient Management ?'[]'$  - 0 ?Hearing / Language / Visual special needs ?'[]'$  - 0 ?Assessment of Community assistance (transportation, D/C planning, etc.) ?'[]'$  - 0 ?Additional assistance / Altered mentation ?'[]'$  - 0 ?Support Surface(s) Assessment (bed, cushion, seat, etc.) ?INTERVENTIONS - Miscellaneous ?'[]'$  - External ear exam 0 ?'[]'$  - 0 ?Patient Transfer (multiple staff / Civil Service fast streamer / Similar devices) ?'[]'$  - 0 ?Simple Staple / Suture removal (25 or less) ?'[]'$  - 0 ?Complex Staple / Suture removal (26 or more) ?'[]'$  - 0 ?Hypo/Hyperglycemic  Management (do not check if billed separately) ?'[]'$  - 0 ?Ankle / Brachial Index (ABI) - do not check if billed separately ?Has the patient been  seen at the hospital within the last three years: Yes ?Total Score: 0 ?Level Of Care: ____ ?[place], [name] ([id number]) ?Electronic Signature(s) ?Signed: 08/17/2021 8:57:31 AM By: [name] RN ?Entered By: [name] on 08/16/2021 14:31:21 ?[place], [name] ([id number]) ?-------------------------------------------------------------------------------- ?Compression Therapy Details ?Patient Name: [name], [name] ?Date of Service: 08/16/2021 2:00 PM ?Medical Record Number: [id number] ?Patient Account Number: [id number] ?Date of Birth/Sex: 06-Oct-1950 (71 y.o. M) ?Treating RN: [name] ?Primary Care Nathon Stefanski: Felipa Eth Other Clinician: ?Referring Chastin Riesgo: Felipa Eth ?Treating Jennene Downie/Extender: Jeri Cos ?Weeks in Treatment: 28 ?Compression Therapy Performed for Wound Assessment: NonWound Condition Lymphedema - Left Leg ?Performed By: Clinician [name], RN ?Compression Type: Four Layer ?Post Procedure Diagnosis ?Same as Pre-procedure ?Electronic Signature(s) ?Signed: 08/17/2021 8:57:31 AM By: [name] RN ?Entered By: [name] on 08/16/2021 14:29:58 ?[place], [name] ([id number]) ?-------------------------------------------------------------------------------- ?Compression Therapy Details ?Patient Name: [name], [name] ?Date of Service: 08/16/2021 2:00 PM ?Medical Record Number: [id number] ?Patient Account Number: [id number] ?Date of Birth/Sex: 02/24/1951 (71 y.o. M) ?Treating RN: [name] ?Primary Care Rivaldo Hineman: Felipa Eth Other Clinician: ?Referring Luana Tatro: Felipa Eth ?Treating Justus Duerr/Extender: Jeri Cos ?Weeks in Treatment: 28 ?Compression Therapy Performed for Wound Assessment: NonWound Condition Lymphedema - Right Leg ?Performed By: Clinician [name], RN ?Compression Type: Four Layer ?Post Procedure Diagnosis ?Same as  Pre-procedure ?Electronic Signature(s) ?Signed: 08/17/2021 8:57:31 AM By: [name] RN ?Entered By: [name] on 08/16/2021 14:30:24 ?[place], [name] ([id number]) ?-------------------------------------------------------------------------------- ?Encounter Discharge Information Details ?Patient Name: [name], [name] ?Date of Service: 08/16/2021 2:00 PM ?Medical Record Number: [id number] ?Patient Account Number: [id number] ?Date of Birth/Sex: 10-08-1950 (71 y.o. M) ?Treating RN: [name] ?Primary Care Travonta Gill: Felipa Eth Other Clinician: ?Referring Haidy Kackley: Felipa Eth ?Treating Sanam Marmo/Extender: Jeri Cos ?Weeks in Treatment: 28 ?Encounter Discharge Information Items ?Discharge Condition: Stable ?Ambulatory Status: [name] ?Discharge Destination: Home ?Transportation: Private Auto ?Accompanied By: self ?Schedule Follow-up Appointment: Yes ?Clinical Summary of Care: Patient Declined ?Electronic Signature(s) ?Signed: 08/16/2021 3:43:38 PM By: [name] RN ?Entered By: [name] on 08/16/2021 15:43:38 ?[place], [name] ([id number]) ?-------------------------------------------------------------------------------- ?Lower Extremity Assessment Details ?Patient Name: [name], [name] ?Date of Service: 08/16/2021 2:00 PM ?Medical Record Number: [id number] ?Patient Account Number: [id number] ?Date of Birth/Sex: 29-May-1950 (71 y.o. M) ?Treating RN: [name] ?Primary Care Kasra Melvin: Felipa Eth Other Clinician: ?Referring Lucylle Foulkes: Felipa Eth ?Treating Taino Maertens/Extender: Jeri Cos ?Weeks in Treatment: 28 ?Edema Assessment ?Assessed: [Left: No] [Right: No] ?Edema: [Left: Yes] [Right: Yes] ?Calf ?Left: Right: ?Point of Measurement: 35 cm From Medial Instep 37 cm 38 cm ?Ankle ?Left: Right: ?Point of Measurement: 10 cm From Medial Instep 38 cm 27 cm ?Vascular Assessment ?Pulses: ?Dorsalis Pedis ?Palpable: [Left:Yes] [Right:Yes] ?Electronic Signature(s) ?Signed: 08/17/2021 8:57:31 AM By: [name] RN ?Entered  By: [name] on 08/16/2021 14:18:04 ?[place], [name] ([id number]) ?-------------------------------------------------------------------------------- ?Multi Wound Chart Details ?Patient Name: [name], [name] ?Date of Service:

## 2021-08-17 NOTE — Progress Notes (Signed)
[name], [name] ([id number]) ?Visit Report for 08/16/2021 ?Chief Complaint Document Details ?Patient Name: [name], [name] ?Date of Service: 08/16/2021 2:00 PM ?Medical Record Number: [id number] ?Patient Account Number: [id number] ?Date of Birth/Sex: [birth year] (71 y.o. M) ?Treating RN: [name] ?Primary Care Provider: Felipa Eth Other Clinician: ?Referring Provider: Felipa Eth ?Treating Provider/Extender: Jeri Cos ?Weeks in Treatment: 28 ?Information Obtained from: Patient ?Chief Complaint ?Left LE ulcers ?Electronic Signature(s) ?Signed: 08/16/2021 2:38:21 PM By: Worthy Keeler PA-C ?Entered By: Worthy Keeler on 08/16/2021 14:38:21 ?[place], [name] ([id number]) ?-------------------------------------------------------------------------------- ?HPI Details ?Patient Name: [name], [name] ?Date of Service: 08/16/2021 2:00 PM ?Medical Record Number: [id number] ?Patient Account Number: [id number] ?Date of Birth/Sex: 08/26/1950 (71 y.o. M) ?Treating RN: [name] ?Primary Care Provider: Felipa Eth Other Clinician: ?Referring Provider: Felipa Eth ?Treating Provider/Extender: Jeri Cos ?Weeks in Treatment: 28 ?History of Present Illness ?HPI Description: 71 year old male who presented to the ER with bilateral lower extremity blisters which had started last week. he has a past ?medical history of leukemia, diabetes mellitus, hypertension, edema of both lower extremities, his recurrent skin infections, peripheral vascular ?disease, coronary artery disease, congestive heart failure and peripheral neuropathy. in the ER he was given Rocephin and put on Silvadene ?cream. he was put on oral doxycycline and was asked to follow-up with the [facility]. ?His last hemoglobin A1c was 6.6 in December and he checks his blood sugar once a week. He does not have any physicians outside the [place] ?system. ?He does not recall any vascular duplex studies done either for arterial or venous disease but was told to wear compression  stockings which he ?does not use ?05/30/2016 -- we have not yet received any of his notes from the [facility] hospital system and his arterial and venous duplex studies are scheduled ?here in [place] around mid February. We are unable to have his insurance accepted by home health agencies and hence he is getting dressings ?only once a week. ?06/06/16 -- -- I received a call from the patient's PCP at the [facility] at [facility] and spoke to Dr. Garvin Fila, phone number [phone] and fax ?number [phone]. ?She confirmed that no vascular testing was done over the last 5 years and she would be happy to do them if the patient did want them to be done ?at the [place] and we could fax him a request. ?Readmission: ?71 year old male seen by as in February of this year and was referred to vein and vascular for studies and opinion from the vascular surgeons. ?The patient returns today with a fresh problem having had blisters on his left lower extremity which have been there for about 5 days and he ?clearly states that he has been wearing his compression stockings as advised though he could not read the moderate compression and has been ?wearing light compression. ?Review of his electronic medical records note that he had lower extremity arterial duplex examination done on 06/23/2016 which showed no ?hemodynamically significant stenosis in the bilateral lower extremity arterial system. ?He also had a lower extremity venous reflux examination done on 07/07/2016 and it was noted that he had venous incompetence in the right ?great saphenous vein and bilateral common femoral veins. ?Patient was seen by Dr. Tamala Julian on the same day and for some reason his notes do not reflect the venous studies or the arterial studies and he ?recommended patient do a venous duplex ultrasound to look for reflux and return to see him.he would also consider a lymph pump if required. ?The patient was told that his workup  was normal and hence the patient canceled his  follow-up appointment. ?02/03/17 on evaluation today patient left medial lower extremity blister appears to be doing about the same. It is still continuing to drain and ?there's still the blistered skin covering the wound bed which is making it difficult for the alternate to do its job. Fortunately there is no evidence of ?cellulitis. No fevers chills noted. Patient states in general he is not having any significant discomfort. ?Patient's lower extremity arterial duplex exam revealed that patient was hemodynamically stable with no evidence of stenosis in regard to the ?bilateral lower extremities. ?The lower extremity venous reflux exam revealed the patient had venous incontinence noted in the right greater saphenous and bilateral common ?femoral vein. There is no evidence of deep or superficial vein thrombosis in the bilateral lower extremities. ?Readmission: ?11/12/18 Patient presents for evaluation our clinic today concerning issues that he is having with his left lower extremity. He tells me that a couple ?weeks ago he began developing blisters on the left lower extremity along with increased swelling. He typically wears his compression stockings on ?a regular basis is previously been evaluated both here as well is with vascular surgery they would recommend lymphedema pumps but ?unfortunately that somehow fell through and he never heard anything back from that. Nonetheless I think lymphedema pumps would be beneficial ?for this patient. He does have a history of hypertension and diabetes. Obviously the chronic venous stasis and lymphedema as well. At this point ?the blisters have been given in more trouble he states sometimes when the blisters openings able to clean it down with alcohol and it will dry out ?and do well. Unfortunately that has not been the case this time. He is having some discomfort although this mean these with cleaning the areas ?he doesn't have discomfort just on a regular basis. He has not been  able to wear his compression stockings since the blisters arose due to the fact ?that of course it will drain into the socks causing additional issues and he didn't have any way to wrap this otherwise. He has increased to taking ?his Lasix every day instead of every other day. He sees his primary care provider later this month as well. No fevers, chills, nausea, or vomiting ?noted at this time. ?11/19/18-Patient returns at 1 week, per intake RN the amount of seepage into the compression wraps was definitely improved, overall all the ?wounds are measuring smaller but continuing silver alginate to the wounds as primary dressing ?11/26/18 on evaluation today patient appears to be doing quite well in regard to his left lower [place] ulcers. In fact of the areas that were noted ?initially he only has two regions still open. There is no evidence of active infection at this time. He still is not heard anything from the company ?regarding lymphedema pumps as of yet. Again as previously seen vascular they have not recommended any surgical intervention. ?12/03/2018 on evaluation today patient actually appears to be doing quite well with regard to his lower extremity ulcers. In fact most of the areas ?appear to be healed the one spot which does not seem to be completely healed I am unsure of whether or not this is really draining that much but ?nonetheless there does not appear to be any signs of infection or significant drainage at this point. There is no sign of fever, chills, nausea, ?vomiting, or diarrhea. Overall I am pleased with how things have progressed I think is very close to being able to transition  to his home ?compression stockings. [name], [name] ([id number]) ?12/10/2018 upon evaluation today patient appears to be doing quite well with regard to his left lower extremity. He has been tolerating the dressing ?changes without complication. Fortunately there is no signs of active infection at this time. He appears after  thorough evaluation of his leg to only ?have 1 small area that remains open at this point everything else appears to be almost completely closed. He still have significant swelling of the ?left lower

## 2021-08-23 ENCOUNTER — Encounter: Payer: PPO | Admitting: Physician Assistant

## 2021-08-23 DIAGNOSIS — E11622 Type 2 diabetes mellitus with other skin ulcer: Secondary | ICD-10-CM | POA: Diagnosis not present

## 2021-08-23 NOTE — Progress Notes (Addendum)
[place], [name] ([id number]) ?Visit Report for 08/23/2021 ?Arrival Information Details ?Patient Name: [name], [name] ?Date of Service: 08/23/2021 2:00 PM ?Medical Record Number: [id number] ?Patient Account Number: [id number] ?Date of Birth/Sex: 1950-09-26 (71 y.o. M) ?Treating RN: [name] ?Primary Care [name]: [name] Other Clinician: ?Referring Kaelem Brach: [name] ?Treating Izaih Kataoka/Extender: [name] ?Weeks in Treatment: 29 ?Visit Information History Since Last Visit ?All ordered tests and consults were completed: No ?Patient Arrived: [name] ?Added or deleted any medications: No ?Arrival Time: 14:00 ?Any new allergies or adverse reactions: No ?Accompanied By: self ?Had a fall or experienced change in No ?Transfer Assistance: None ?activities of daily living that may affect ?Patient Identification Verified: Yes ?risk of falls: ?Secondary Verification Process Completed: Yes ?Signs or symptoms of abuse/neglect since last visito No ?Patient Requires Transmission-Based No ?Hospitalized since last visit: No ?Precautions: ?Implantable device outside of the clinic excluding No ?Patient Has Alerts: Yes ?cellular tissue based products placed in the center ?Patient Alerts: AVVS consult on file ?since last visit: ?Last ABI-R 1.09; L ?Has Dressing in Place as Prescribed: Yes ?1.04 ?Has Compression in Place as Prescribed: Yes ?Pain Present Now: No ?Electronic Signature(s) ?Signed: 08/24/2021 9:16:22 AM By: [name] Coria RN ?Entered By: [name] on 08/23/2021 14:03:34 ?[place], [name] ([id number]) ?-------------------------------------------------------------------------------- ?Clinic Level of Care Assessment Details ?Patient Name: [name], [name] ?Date of Service: 08/23/2021 2:00 PM ?Medical Record Number: [id number] ?Patient Account Number: [id number] ?Date of Birth/Sex: 06/25/1950 (71 y.o. M) ?Treating RN: [name] ?Primary Care Viney Acocella: [name] Other Clinician: ?Referring Joycelin Radloff: [name] ?Treating Grover Robinson/Extender: [name] ?Weeks in Treatment: 29 ?Clinic Level of Care Assessment Items ?TOOL 1 Quantity Score ?'[]'$  - Use when EandM and Procedure is performed on INITIAL visit 0 ?ASSESSMENTS - Nursing Assessment / Reassessment ?'[]'$  - General Physical Exam (combine w/ comprehensive assessment (listed just below) when performed on new ?0 ?pt. evals) ?'[]'$  - 0 ?Comprehensive Assessment (HX, ROS, Risk Assessments, Wounds Hx, etc.) ?ASSESSMENTS - Wound and Skin Assessment / Reassessment ?'[]'$  - Dermatologic / Skin Assessment (not related to wound area) 0 ?ASSESSMENTS - Ostomy and/or Continence Assessment and Care ?'[]'$  - Incontinence Assessment and Management 0 ?'[]'$  - 0 ?Ostomy Care Assessment and Management (repouching, etc.) ?PROCESS - Coordination of Care ?'[]'$  - Simple Patient / Family Education for ongoing care 0 ?'[]'$  - 0 ?Complex (extensive) Patient / Family Education for ongoing care ?'[]'$  - 0 ?Staff obtains Consents, Records, Test Results / Process Orders ?'[]'$  - 0 ?Staff telephones HHA, Nursing Homes / Clarify orders / etc ?'[]'$  - 0 ?Routine Transfer to another Facility (non-emergent condition) ?'[]'$  - 0 ?Routine Hospital Admission (non-emergent condition) ?'[]'$  - 0 ?New Admissions / Biomedical engineer / Ordering NPWT, Apligraf, etc. ?'[]'$  - 0 ?Emergency Hospital Admission (emergent condition) ?PROCESS - Special Needs ?'[]'$  - Pediatric / Minor Patient Management 0 ?'[]'$  - 0 ?Isolation Patient Management ?'[]'$  - 0 ?Hearing / Language / Visual special needs ?'[]'$  - 0 ?Assessment of Community assistance (transportation, D/C planning, etc.) ?'[]'$  - 0 ?Additional assistance / Altered mentation ?'[]'$  - 0 ?Support Surface(s) Assessment (bed, cushion, seat, etc.) ?INTERVENTIONS - Miscellaneous ?'[]'$  - External ear exam 0 ?'[]'$  - 0 ?Patient Transfer (multiple staff / Civil Service fast streamer / Similar devices) ?'[]'$  - 0 ?Simple Staple / Suture removal (25 or less) ?'[]'$  - 0 ?Complex Staple / Suture removal (26 or more) ?'[]'$  - 0 ?Hypo/Hyperglycemic  Management (do not check if billed separately) ?'[]'$  - 0 ?Ankle / Brachial Index (ABI) - do not check if billed separately ?Has the patient been  seen at the hospital within the last three years: Yes ?Total Score: 0 ?Level Of Care: ____ ?[place], [name] ([id number]) ?Electronic Signature(s) ?Signed: 08/24/2021 9:16:22 AM By: [name] Coria RN ?Entered By: [name] on 08/23/2021 14:27:51 ?[place], [name] ([id number]) ?-------------------------------------------------------------------------------- ?Compression Therapy Details ?Patient Name: [name], [name] ?Date of Service: 08/23/2021 2:00 PM ?Medical Record Number: [id number] ?Patient Account Number: [id number] ?Date of Birth/Sex: 1950-09-20 (71 y.o. M) ?Treating RN: [name] ?Primary Care Harlin Mazzoni: [name] Other Clinician: ?Referring Prather Failla: [name] ?Treating Octivia Canion/Extender: [name] ?Weeks in Treatment: 29 ?Compression Therapy Performed for Wound Assessment: NonWound Condition Lymphedema - Left Leg ?Performed By: Clinician [name] Coria, RN ?Compression Type: Four Layer ?Post Procedure Diagnosis ?Same as Pre-procedure ?Electronic Signature(s) ?Signed: 08/23/2021 2:26:58 PM By: [name] Coria RN ?Entered By: [name] on 08/23/2021 14:26:57 ?[place], [name] ([id number]) ?-------------------------------------------------------------------------------- ?Compression Therapy Details ?Patient Name: [name], [name] ?Date of Service: 08/23/2021 2:00 PM ?Medical Record Number: [id number] ?Patient Account Number: [id number] ?Date of Birth/Sex: 24-Oct-1950 (71 y.o. M) ?Treating RN: [name] ?Primary Care Placida Cambre: [name] Other Clinician: ?Referring Brentin Shin: [name] ?Treating Noora Locascio/Extender: [name] ?Weeks in Treatment: 29 ?Compression Therapy Performed for Wound Assessment: NonWound Condition Lymphedema - Right Leg ?Performed By: Clinician [name] Coria, RN ?Compression Type: Four Layer ?Post Procedure Diagnosis ?Same as  Pre-procedure ?Electronic Signature(s) ?Signed: 08/23/2021 2:27:21 PM By: [name] Coria RN ?Entered By: [name] on 08/23/2021 14:27:21 ?[place], [name] ([id number]) ?-------------------------------------------------------------------------------- ?Encounter Discharge Information Details ?Patient Name: [name], [name] ?Date of Service: 08/23/2021 2:00 PM ?Medical Record Number: [id number] ?Patient Account Number: [id number] ?Date of Birth/Sex: [birth year] (71 y.o. M) ?Treating RN: [name] ?Primary Care Deasha Clendenin: [name] Other Clinician: ?Referring [name]: [name] ?Treating Dshaun Reppucci/Extender: [name] ?Weeks in Treatment: 29 ?Encounter Discharge Information Items ?Discharge Condition: Stable ?Ambulatory Status: [name] ?Discharge Destination: Home ?Transportation: Private Auto ?Accompanied By: self ?Schedule Follow-up Appointment: Yes ?Clinical Summary of Care: Patient Declined ?Electronic Signature(s) ?Signed: 08/23/2021 2:33:16 PM By: [name] Coria RN ?Entered By: [name] on 08/23/2021 14:33:15 ?[place], [name] ([id number]) ?-------------------------------------------------------------------------------- ?Lower Extremity Assessment Details ?Patient Name: [name], [name] ?Date of Service: 08/23/2021 2:00 PM ?Medical Record Number: [id number] ?Patient Account Number: [id number] ?Date of Birth/Sex: 1950-08-24 (71 y.o. M) ?Treating RN: [name] ?Primary Care Ulices Maack: [name] Other Clinician: ?Referring [name]: [name] ?Treating Cydni Reddoch/Extender: [name] ?Weeks in Treatment: 29 ?Edema Assessment ?Assessed: [Left: No] [Right: No] ?Edema: [Left: Yes] [Right: Yes] ?Calf ?Left: Right: ?Point of Measurement: 35 cm From Medial Instep 38 cm 39 cm ?Ankle ?Left: Right: ?Point of Measurement: 10 cm From Medial Instep 29 cm 27 cm ?Vascular Assessment ?Pulses: ?Dorsalis Pedis ?Palpable: [Left:Yes] [Right:Yes] ?Electronic Signature(s) ?Signed: 08/24/2021 9:16:22 AM By: [name] Coria RN ?Entered  By: [name] on 08/23/2021 14:15:25 ?[place], [name] ([id number]) ?-------------------------------------------------------------------------------- ?Multi Wound Chart Details ?Patient Name: AURON, TADROS ?Date of Service: 4/17/2

## 2021-08-23 NOTE — Progress Notes (Addendum)
[place], [name] ([id number]) ?Visit Report for 08/23/2021 ?Chief Complaint Document Details ?Patient Name: [name], [name] ?Date of Service: 08/23/2021 2:00 PM ?Medical Record Number: [id number] ?Patient Account Number: [id number] ?Date of Birth/Sex: 02-25-1951 (71 y.o. M) ?Treating RN: [name] ?Primary Care Provider: Felipa Eth Other Clinician: ?Referring Provider: Felipa Eth ?Treating Provider/Extender: Jeri Cos ?Weeks in Treatment: 29 ?Information Obtained from: Patient ?Chief Complaint ?Left LE ulcers ?Electronic Signature(s) ?Signed: 08/23/2021 2:00:21 PM By: Worthy Keeler PA-C ?Entered By: Worthy Keeler on 08/23/2021 14:00:21 ?[place], [name] ([id number]) ?-------------------------------------------------------------------------------- ?HPI Details ?Patient Name: [name] ?Date of Service: 08/23/2021 2:00 PM ?Medical Record Number: [id number] ?Patient Account Number: [id number] ?Date of Birth/Sex: 09/21/1950 (71 y.o. M) ?Treating RN: [name] ?Primary Care Provider: Felipa Eth Other Clinician: ?Referring Provider: Felipa Eth ?Treating Provider/Extender: Jeri Cos ?Weeks in Treatment: 29 ?History of Present Illness ?HPI Description: 71 year old male who presented to the ER with bilateral lower extremity blisters which had started last week. he has a past ?medical history of leukemia, diabetes mellitus, hypertension, edema of both lower extremities, his recurrent skin infections, peripheral vascular ?disease, coronary artery disease, congestive heart failure and peripheral neuropathy. in the ER he was given Rocephin and put on Silvadene ?cream. he was put on oral doxycycline and was asked to follow-up with the [facility]. ?His last hemoglobin A1c was 6.6 in December and he checks his blood sugar once a week. He does not have any physicians outside the [place] ?system. ?He does not recall any vascular duplex studies done either for arterial or venous disease but was told to wear compression  stockings which he ?does not use ?05/30/2016 -- we have not yet received any of his notes from the [facility] hospital system and his arterial and venous duplex studies are scheduled ?here in [place] around mid February. We are unable to have his insurance accepted by home health agencies and hence he is getting dressings ?only once a week. ?06/06/16 -- -- I received a call from the patient's PCP at the [facility] and spoke to Dr. Garvin Fila, phone number [phone] and fax ?number [phone]. ?She confirmed that no vascular testing was done over the last 5 years and she would be happy to do them if the patient did want them to be done ?at the [place] and we could fax him a request. ?Readmission: ?71 year old male seen by as in February of this year and was referred to vein and vascular for studies and opinion from the vascular surgeons. ?The patient returns today with a fresh problem having had blisters on his left lower extremity which have been there for about 5 days and he ?clearly states that he has been wearing his compression stockings as advised though he could not read the moderate compression and has been ?wearing light compression. ?Review of his electronic medical records note that he had lower extremity arterial duplex examination done on 06/23/2016 which showed no ?hemodynamically significant stenosis in the bilateral lower extremity arterial system. ?He also had a lower extremity venous reflux examination done on 07/07/2016 and it was noted that he had venous incompetence in the right ?great saphenous vein and bilateral common femoral veins. ?Patient was seen by Dr. Tamala Julian on the same day and for some reason his notes do not reflect the venous studies or the arterial studies and he ?recommended patient do a venous duplex ultrasound to look for reflux and return to see him.he would also consider a lymph pump if required. ?The patient was told that his workup  was normal and hence the patient canceled his  follow-up appointment. ?02/03/17 on evaluation today patient left medial lower extremity blister appears to be doing about the same. It is still continuing to drain and ?there's still the blistered skin covering the wound bed which is making it difficult for the alternate to do its job. Fortunately there is no evidence of ?cellulitis. No fevers chills noted. Patient states in general he is not having any significant discomfort. ?Patient's lower extremity arterial duplex exam revealed that patient was hemodynamically stable with no evidence of stenosis in regard to the ?bilateral lower extremities. ?The lower extremity venous reflux exam revealed the patient had venous incontinence noted in the right greater saphenous and bilateral common ?femoral vein. There is no evidence of deep or superficial vein thrombosis in the bilateral lower extremities. ?Readmission: ?11/12/18 Patient presents for evaluation our clinic today concerning issues that he is having with his left lower extremity. He tells me that a couple ?weeks ago he began developing blisters on the left lower extremity along with increased swelling. He typically wears his compression stockings on ?a regular basis is previously been evaluated both here as well is with vascular surgery they would recommend lymphedema pumps but ?unfortunately that somehow fell through and he never heard anything back from that. Nonetheless I think lymphedema pumps would be beneficial ?for this patient. He does have a history of hypertension and diabetes. Obviously the chronic venous stasis and lymphedema as well. At this point ?the blisters have been given in more trouble he states sometimes when the blisters openings able to clean it down with alcohol and it will dry out ?and do well. Unfortunately that has not been the case this time. He is having some discomfort although this mean these with cleaning the areas ?he doesn't have discomfort just on a regular basis. He has not been  able to wear his compression stockings since the blisters arose due to the fact ?that of course it will drain into the socks causing additional issues and he didn't have any way to wrap this otherwise. He has increased to taking ?his Lasix every day instead of every other day. He sees his primary care provider later this month as well. No fevers, chills, nausea, or vomiting ?noted at this time. ?11/19/18-Patient returns at 1 week, per intake RN the amount of seepage into the compression wraps was definitely improved, overall all the ?wounds are measuring smaller but continuing silver alginate to the wounds as primary dressing ?11/26/18 on evaluation today patient appears to be doing quite well in regard to his left lower Trinity ulcers. In fact of the areas that were noted ?initially he only has two regions still open. There is no evidence of active infection at this time. He still is not heard anything from the company ?regarding lymphedema pumps as of yet. Again as previously seen vascular they have not recommended any surgical intervention. ?12/03/2018 on evaluation today patient actually appears to be doing quite well with regard to his lower extremity ulcers. In fact most of the areas ?appear to be healed the one spot which does not seem to be completely healed I am unsure of whether or not this is really draining that much but ?nonetheless there does not appear to be any signs of infection or significant drainage at this point. There is no sign of fever, chills, nausea, ?vomiting, or diarrhea. Overall I am pleased with how things have progressed I think is very close to being able to transition  to his home ?compression stockings. CAMMERON, GREIS ([id number]) ?12/10/2018 upon evaluation today patient appears to be doing quite well with regard to his left lower extremity. He has been tolerating the dressing ?changes without complication. Fortunately there is no signs of active infection at this time. He appears after  thorough evaluation of his leg to only ?have 1 small area that remains open at this point everything else appears to be almost completely closed. He still have significant swelling of the ?left lower

## 2021-08-30 ENCOUNTER — Encounter: Payer: PPO | Admitting: Physician Assistant

## 2021-08-30 DIAGNOSIS — E11622 Type 2 diabetes mellitus with other skin ulcer: Secondary | ICD-10-CM | POA: Diagnosis not present

## 2021-09-01 NOTE — Progress Notes (Signed)
[place], [name] ([id number]) ?Visit Report for 08/30/2021 ?Chief Complaint Document Details ?Patient Name: [name], [name] ?Date of Service: 08/30/2021 2:00 PM ?Medical Record Number: [id number] ?Patient Account Number: [id number] ?Date of Birth/Sex: 11-09-1950 (71 y.o. M) ?Treating RN: [name] ?Primary Care Provider: Fontaine No Other Clinician: ?Referring Provider: Felipa Eth ?Treating Provider/Extender: Jeri Cos ?Weeks in Treatment: 30 ?Information Obtained from: Patient ?Chief Complaint ?Left LE ulcers ?Electronic Signature(s) ?Signed: 08/30/2021 2:42:46 PM By: Worthy Keeler PA-C ?Entered By: Worthy Keeler on 08/30/2021 14:42:46 ?[place], [name] ([id number]) ?-------------------------------------------------------------------------------- ?HPI Details ?Patient Name: [name], [name] ?Date of Service: 08/30/2021 2:00 PM ?Medical Record Number: [id number] ?Patient Account Number: [id number] ?Date of Birth/Sex: Feb 05, 1951 (71 y.o. M) ?Treating RN: [name] ?Primary Care Provider: [place] No Other Clinician: ?Referring Provider: Felipa Eth ?Treating Provider/Extender: Jeri Cos ?Weeks in Treatment: 30 ?History of Present Illness ?HPI Description: 71 year old male who presented to the ER with bilateral lower extremity blisters which had started last week. he has a past ?medical history of leukemia, diabetes mellitus, hypertension, edema of both lower extremities, his recurrent skin infections, peripheral vascular ?disease, coronary artery disease, congestive heart failure and peripheral neuropathy. in the ER he was given Rocephin and put on Silvadene ?cream. he was put on oral doxycycline and was asked to follow-up with the [facility]. ?His last hemoglobin A1c was 6.6 in December and he checks his blood sugar once a week. He does not have any physicians outside the [place] ?system. ?He does not recall any vascular duplex studies done either for arterial or venous disease but was told to wear  compression stockings which he ?does not use ?05/30/2016 -- we have not yet received any of his notes from the [facility] hospital system and his arterial and venous duplex studies are scheduled ?here in [place] around mid February. We are unable to have his insurance accepted by home health agencies and hence he is getting dressings ?only once a week. ?06/06/16 -- -- I received a call from the patient's PCP at the [facility] at [facility] and spoke to Dr. Garvin Fila, phone number [phone] and fax ?number [phone]. ?She confirmed that no vascular testing was done over the last 5 years and she would be happy to do them if the patient did want them to be done ?at the [place] and we could fax him a request. ?Readmission: ?71 year old male seen by as in February of this year and was referred to vein and vascular for studies and opinion from the vascular surgeons. ?The patient returns today with a fresh problem having had blisters on his left lower extremity which have been there for about 5 days and he ?clearly states that he has been wearing his compression stockings as advised though he could not read the moderate compression and has been ?wearing light compression. ?Review of his electronic medical records note that he had lower extremity arterial duplex examination done on 06/23/2016 which showed no ?hemodynamically significant stenosis in the bilateral lower extremity arterial system. ?He also had a lower extremity venous reflux examination done on 07/07/2016 and it was noted that he had venous incompetence in the right ?great saphenous vein and bilateral common femoral veins. ?Patient was seen by Dr. Tamala Julian on the same day and for some reason his notes do not reflect the venous studies or the arterial studies and he ?recommended patient do a venous duplex ultrasound to look for reflux and return to see him.he would also consider a lymph pump if required. ?The patient was told that his workup  was normal and hence the patient  canceled his follow-up appointment. ?02/03/17 on evaluation today patient left medial lower extremity blister appears to be doing about the same. It is still continuing to drain and ?there's still the blistered skin covering the wound bed which is making it difficult for the alternate to do its job. Fortunately there is no evidence of ?cellulitis. No fevers chills noted. Patient states in general he is not having any significant discomfort. ?Patient's lower extremity arterial duplex exam revealed that patient was hemodynamically stable with no evidence of stenosis in regard to the ?bilateral lower extremities. ?The lower extremity venous reflux exam revealed the patient had venous incontinence noted in the right greater saphenous and bilateral common ?femoral vein. There is no evidence of deep or superficial vein thrombosis in the bilateral lower extremities. ?Readmission: ?11/12/18 Patient presents for evaluation our clinic today concerning issues that he is having with his left lower extremity. He tells me that a couple ?weeks ago he began developing blisters on the left lower extremity along with increased swelling. He typically wears his compression stockings on ?a regular basis is previously been evaluated both here as well is with vascular surgery they would recommend lymphedema pumps but ?unfortunately that somehow fell through and he never heard anything back from that. Nonetheless I think lymphedema pumps would be beneficial ?for this patient. He does have a history of hypertension and diabetes. Obviously the chronic venous stasis and lymphedema as well. At this point ?the blisters have been given in more trouble he states sometimes when the blisters openings able to clean it down with alcohol and it will dry out ?and do well. Unfortunately that has not been the case this time. He is having some discomfort although this mean these with cleaning the areas ?he doesn't have discomfort just on a regular basis. He  has not been able to wear his compression stockings since the blisters arose due to the fact ?that of course it will drain into the socks causing additional issues and he didn't have any way to wrap this otherwise. He has increased to taking ?his Lasix every day instead of every other day. He sees his primary care provider later this month as well. No fevers, chills, nausea, or vomiting ?noted at this time. ?11/19/18-Patient returns at 1 week, per intake RN the amount of seepage into the compression wraps was definitely improved, overall all the ?wounds are measuring smaller but continuing silver alginate to the wounds as primary dressing ?11/26/18 on evaluation today patient appears to be doing quite well in regard to his left lower Trinity ulcers. In fact of the areas that were noted ?initially he only has two regions still open. There is no evidence of active infection at this time. He still is not heard anything from the company ?regarding lymphedema pumps as of yet. Again as previously seen vascular they have not recommended any surgical intervention. ?12/03/2018 on evaluation today patient actually appears to be doing quite well with regard to his lower extremity ulcers. In fact most of the areas ?appear to be healed the one spot which does not seem to be completely healed I am unsure of whether or not this is really draining that much but ?nonetheless there does not appear to be any signs of infection or significant drainage at this point. There is no sign of fever, chills, nausea, ?vomiting, or diarrhea. Overall I am pleased with how things have progressed I think is very close to being able to transition  to his home ?compression stockings. [name], [name] ([id number]) ?12/10/2018 upon evaluation today patient appears to be doing quite well with regard to his left lower extremity. He has been tolerating the dressing ?changes without complication. Fortunately there is no signs of active infection at this time. He  appears after thorough evaluation of his leg to only ?have 1 small area that remains open at this point everything else appears to be almost completely closed. He still have significant swelling of the ?le

## 2021-09-01 NOTE — Progress Notes (Signed)
[place], [name] ([id number]) ?Visit Report for 08/30/2021 ?Arrival Information Details ?Patient Name: [name], [name] ?Date of Service: 08/30/2021 2:00 PM ?Medical Record Number: [id number] ?Patient Account Number: [id number] ?Date of Birth/Sex: 11/24/1950 (71 y.o. M) ?Treating RN: [name] ?Primary Care Janney Priego: [place] No Other Clinician: ?Referring Colt Martelle: Felipa Eth ?Treating Kalii Chesmore/Extender: Jeri Cos ?Weeks in Treatment: 30 ?Visit Information History Since Last Visit ?All ordered tests and consults were completed: No ?Patient Arrived: Ambulatory ?Added or deleted any medications: No ?Arrival Time: 14:03 ?Any new allergies or adverse reactions: No ?Accompanied By: self ?Had a fall or experienced change in No ?Transfer Assistance: None ?activities of daily living that may affect ?Patient Identification Verified: Yes ?risk of falls: ?Secondary Verification Process Completed: Yes ?Signs or symptoms of abuse/neglect since last visito No ?Patient Requires Transmission-Based No ?Hospitalized since last visit: No ?Precautions: ?Implantable device outside of the clinic excluding No ?Patient Has Alerts: Yes ?cellular tissue based products placed in the center ?Patient Alerts: AVVS consult on file ?since last visit: ?Last ABI-R 1.09; L ?Has Dressing in Place as Prescribed: Yes ?1.04 ?Has Compression in Place as Prescribed: Yes ?Pain Present Now: No ?Electronic Signature(s) ?Signed: 09/01/2021 8:47:39 AM By: [name] RN ?Entered By: [name] on 08/30/2021 14:06:45 ?[place], [name] ([id number]) ?-------------------------------------------------------------------------------- ?Clinic Level of Care Assessment Details ?Patient Name: [name], [name] ?Date of Service: 08/30/2021 2:00 PM ?Medical Record Number: [id number] ?Patient Account Number: [id number] ?Date of Birth/Sex: [birth year] (71 y.o. M) ?Treating RN: [name] ?Primary Care Allysia Ingles: [place] No Other Clinician: ?Referring Nicollette Wilhelmi:  Felipa Eth ?Treating Sweet Jarvis/Extender: Jeri Cos ?Weeks in Treatment: 30 ?Clinic Level of Care Assessment Items ?TOOL 1 Quantity Score ?'[]'$  - Use when EandM and Procedure is performed on INITIAL visit 0 ?ASSESSMENTS - Nursing Assessment / Reassessment ?'[]'$  - General Physical Exam (combine w/ comprehensive assessment (listed just below) when performed on new ?0 ?pt. evals) ?'[]'$  - 0 ?Comprehensive Assessment (HX, ROS, Risk Assessments, Wounds Hx, etc.) ?ASSESSMENTS - Wound and Skin Assessment / Reassessment ?'[]'$  - Dermatologic / Skin Assessment (not related to wound area) 0 ?ASSESSMENTS - Ostomy and/or Continence Assessment and Care ?'[]'$  - Incontinence Assessment and Management 0 ?'[]'$  - 0 ?Ostomy Care Assessment and Management (repouching, etc.) ?PROCESS - Coordination of Care ?'[]'$  - Simple Patient / Family Education for ongoing care 0 ?'[]'$  - 0 ?Complex (extensive) Patient / Family Education for ongoing care ?'[]'$  - 0 ?Staff obtains Consents, Records, Test Results / Process Orders ?'[]'$  - 0 ?Staff telephones HHA, Nursing Homes / Clarify orders / etc ?'[]'$  - 0 ?Routine Transfer to another Facility (non-emergent condition) ?'[]'$  - 0 ?Routine Hospital Admission (non-emergent condition) ?'[]'$  - 0 ?New Admissions / Biomedical engineer / Ordering NPWT, Apligraf, etc. ?'[]'$  - 0 ?Emergency Hospital Admission (emergent condition) ?PROCESS - Special Needs ?'[]'$  - Pediatric / Minor Patient Management 0 ?'[]'$  - 0 ?Isolation Patient Management ?'[]'$  - 0 ?Hearing / Language / Visual special needs ?'[]'$  - 0 ?Assessment of Community assistance (transportation, D/C planning, etc.) ?'[]'$  - 0 ?Additional assistance / Altered mentation ?'[]'$  - 0 ?Support Surface(s) Assessment (bed, cushion, seat, etc.) ?INTERVENTIONS - Miscellaneous ?'[]'$  - External ear exam 0 ?'[]'$  - 0 ?Patient Transfer (multiple staff / Civil Service fast streamer / Similar devices) ?'[]'$  - 0 ?Simple Staple / Suture removal (25 or less) ?'[]'$  - 0 ?Complex Staple / Suture removal (26 or more) ?'[]'$  -  0 ?Hypo/Hyperglycemic Management (do not check if billed separately) ?'[]'$  - 0 ?Ankle / Brachial Index (ABI) - do not check if billed separately ?Has the patient been  seen at the hospital within the last three years: Yes ?Total Score: 0 ?Level Of Care: ____ ?[place], [name] ([id number]) ?Electronic Signature(s) ?Signed: 09/01/2021 8:47:39 AM By: [name] RN ?Entered By: [name] on 08/30/2021 14:56:59 ?[place], [name] ([id number]) ?-------------------------------------------------------------------------------- ?Compression Therapy Details ?Patient Name: [name], [name] ?Date of Service: 08/30/2021 2:00 PM ?Medical Record Number: [id number] ?Patient Account Number: [id number] ?Date of Birth/Sex: 1950-10-06 (71 y.o. M) ?Treating RN: [name] ?Primary Care Cyntha Brickman: [place] No Other Clinician: ?Referring Kenidi Elenbaas: Felipa Eth ?Treating Ovida Delagarza/Extender: Jeri Cos ?Weeks in Treatment: 30 ?Compression Therapy Performed for Wound Assessment: NonWound Condition Lymphedema - Left Leg ?Performed By: Clinician [name], RN ?Compression Type: Four Layer ?Post Procedure Diagnosis ?Same as Pre-procedure ?Electronic Signature(s) ?Signed: 09/01/2021 8:47:39 AM By: [name] RN ?Entered By: [name] on 08/30/2021 14:39:18 ?[place], [name] ([id number]) ?-------------------------------------------------------------------------------- ?Compression Therapy Details ?Patient Name: [name], [name] ?Date of Service: 08/30/2021 2:00 PM ?Medical Record Number: [id number] ?Patient Account Number: [id number] ?Date of Birth/Sex: 1951/03/12 (71 y.o. M) ?Treating RN: [name] ?Primary Care Zamariya Neal: [place] No Other Clinician: ?Referring Jacobe Study: Felipa Eth ?Treating Rogen Porte/Extender: Jeri Cos ?Weeks in Treatment: 30 ?Compression Therapy Performed for Wound Assessment: NonWound Condition Lymphedema - Right Leg ?Performed By: Clinician [name], RN ?Compression Type: Four Layer ?Post Procedure  Diagnosis ?Same as Pre-procedure ?Electronic Signature(s) ?Signed: 09/01/2021 8:47:39 AM By: [name] RN ?Entered By: [name] on 08/30/2021 14:39:38 ?[place], [name] ([id number]) ?-------------------------------------------------------------------------------- ?Encounter Discharge Information Details ?Patient Name: [name], [name] ?Date of Service: 08/30/2021 2:00 PM ?Medical Record Number: [id number] ?Patient Account Number: [id number] ?Date of Birth/Sex: [birth year] (71 y.o. M) ?Treating RN: [name] ?Primary Care Pranathi Winfree: [place] No Other Clinician: ?Referring Loma [place]: Felipa Eth ?Treating Javontae Marlette/Extender: Jeri Cos ?Weeks in Treatment: 30 ?Encounter Discharge Information Items ?Discharge Condition: Stable ?Ambulatory Status: [name] ?Discharge Destination: Home ?Transportation: Private Auto ?Accompanied By: self ?Schedule Follow-up Appointment: Yes ?Clinical Summary of Care: Patient Declined ?Electronic Signature(s) ?Signed: 09/01/2021 8:47:39 AM By: [name] RN ?Entered By: [name] on 08/30/2021 14:59:51 ?[place], [name] ([id number]) ?-------------------------------------------------------------------------------- ?Lower Extremity Assessment Details ?Patient Name: [name], [name] ?Date of Service: 08/30/2021 2:00 PM ?Medical Record Number: [id number] ?Patient Account Number: [id number] ?Date of Birth/Sex: 07-02-1950 (71 y.o. M) ?Treating RN: [name] ?Primary Care Nichael Ehly: [place] No Other Clinician: ?Referring Derrell Milanes: Felipa Eth ?Treating Silver Achey/Extender: Jeri Cos ?Weeks in Treatment: 30 ?Edema Assessment ?Assessed: [Left: No] [Right: No] ?[Left: Edema] [Right: :] ?Calf ?Left: Right: ?Point of Measurement: 35 cm From Medial Instep 38 cm 39 cm ?Ankle ?Left: Right: ?Point of Measurement: 10 cm From Medial Instep 29 cm 27 cm ?Vascular Assessment ?Pulses: ?Dorsalis Pedis ?Palpable: [Left:Yes] [Right:Yes] ?Electronic Signature(s) ?Signed: 09/01/2021 8:47:39 AM By:  [name] RN ?Entered By: [name] on 08/30/2021 14:18:07 ?[place], [name] ([id number]) ?-------------------------------------------------------------------------------- ?Multi Wound Chart Details ?Patient Name: [name], [name] ?

## 2021-09-06 ENCOUNTER — Encounter: Payer: PPO | Attending: Physician Assistant | Admitting: Physician Assistant

## 2021-09-06 DIAGNOSIS — L97512 Non-pressure chronic ulcer of other part of right foot with fat layer exposed: Secondary | ICD-10-CM | POA: Diagnosis not present

## 2021-09-06 DIAGNOSIS — Z79899 Other long term (current) drug therapy: Secondary | ICD-10-CM | POA: Insufficient documentation

## 2021-09-06 DIAGNOSIS — E11622 Type 2 diabetes mellitus with other skin ulcer: Secondary | ICD-10-CM | POA: Diagnosis present

## 2021-09-06 DIAGNOSIS — I890 Lymphedema, not elsewhere classified: Secondary | ICD-10-CM | POA: Insufficient documentation

## 2021-09-06 DIAGNOSIS — Z856 Personal history of leukemia: Secondary | ICD-10-CM | POA: Diagnosis not present

## 2021-09-06 DIAGNOSIS — E1142 Type 2 diabetes mellitus with diabetic polyneuropathy: Secondary | ICD-10-CM | POA: Insufficient documentation

## 2021-09-06 DIAGNOSIS — I110 Hypertensive heart disease with heart failure: Secondary | ICD-10-CM | POA: Diagnosis not present

## 2021-09-06 DIAGNOSIS — I2510 Atherosclerotic heart disease of native coronary artery without angina pectoris: Secondary | ICD-10-CM | POA: Diagnosis not present

## 2021-09-06 DIAGNOSIS — L97811 Non-pressure chronic ulcer of other part of right lower leg limited to breakdown of skin: Secondary | ICD-10-CM | POA: Diagnosis not present

## 2021-09-06 DIAGNOSIS — I5040 Unspecified combined systolic (congestive) and diastolic (congestive) heart failure: Secondary | ICD-10-CM | POA: Insufficient documentation

## 2021-09-06 DIAGNOSIS — I872 Venous insufficiency (chronic) (peripheral): Secondary | ICD-10-CM | POA: Insufficient documentation

## 2021-09-06 DIAGNOSIS — L97822 Non-pressure chronic ulcer of other part of left lower leg with fat layer exposed: Secondary | ICD-10-CM | POA: Diagnosis not present

## 2021-09-06 NOTE — Progress Notes (Addendum)
[place], [name] ([id number]) ?Visit Report for 09/06/2021 ?Arrival Information Details ?Patient Name: [name], [name] ?Date of Service: 09/06/2021 2:00 PM ?Medical Record Number: [id number] ?Patient Account Number: [id number] ?Date of Birth/Sex: Jul 02, 1950 (71 y.o. M) ?Treating RN: Cornell Barman ?Primary Care Larrisa Cravey: [place] No Other Clinician: ?Referring Yazid Pop: Felipa Eth ?Treating Atthew Coutant/Extender: Jeri Cos ?Weeks in Treatment: 31 ?Visit Information History Since Last Visit ?Added or deleted any medications: No ?Patient Arrived: [name] ?Has Dressing in Place as Prescribed: Yes ?Arrival Time: 14:00 ?Has Footwear/Offloading in Place as Prescribed: Yes ?Accompanied By: self ?Left: Other:surgical shoe ?Transfer Assistance: None ?Right: Other:surgical shoe ?Patient Identification Verified: Yes ?Pain Present Now: No ?Secondary Verification Process Completed: Yes ?Patient Requires Transmission-Based No ?Precautions: ?Patient Has Alerts: Yes ?Patient Alerts: AVVS consult on file ?Last ABI-R 1.09; L ?1.04 ?Electronic Signature(s) ?Signed: 09/06/2021 3:25:15 PM By: Gretta Cool, BSN, RN, CWS, Kim RN, BSN ?Entered By: Gretta Cool, BSN, RN, CWS, Kim on 09/06/2021 14:02:17 ?[place], [name] ([id number]) ?-------------------------------------------------------------------------------- ?Compression Therapy Details ?Patient Name: [name], [name] ?Date of Service: 09/06/2021 2:00 PM ?Medical Record Number: [id number] ?Patient Account Number: [id number] ?Date of Birth/Sex: May 03, 1951 (71 y.o. M) ?Treating RN: Cornell Barman ?Primary Care Janeene Sand: [place] No Other Clinician: ?Referring Silvano Garofano: Felipa Eth ?Treating Brenten Janney/Extender: Jeri Cos ?Weeks in Treatment: 31 ?Compression Therapy Performed for Wound Assessment: NonWound Condition Lymphedema - Right Leg ?Performed By: Clinician Cornell Barman, RN ?Compression Type: Four Layer ?Post Procedure Diagnosis ?Same as Pre-procedure ?Electronic Signature(s) ?Signed: 09/06/2021 3:25:15 PM By:  Gretta Cool, BSN, RN, CWS, Kim RN, BSN ?Entered By: Gretta Cool, BSN, RN, CWS, Kim on 09/06/2021 14:55:00 ?[place], [name] ([id number]) ?-------------------------------------------------------------------------------- ?Compression Therapy Details ?Patient Name: [name], [name] ?Date of Service: 09/06/2021 2:00 PM ?Medical Record Number: [id number] ?Patient Account Number: [id number] ?Date of Birth/Sex: January 24, 1951 (71 y.o. M) ?Treating RN: Cornell Barman ?Primary Care Whitman Meinhardt: [place] No Other Clinician: ?Referring Divante Kotch: Felipa Eth ?Treating Kayan Blissett/Extender: Jeri Cos ?Weeks in Treatment: 31 ?Compression Therapy Performed for Wound Assessment: NonWound Condition Lymphedema - Left Leg ?Performed By: Clinician Cornell Barman, RN ?Compression Type: Four Layer ?Post Procedure Diagnosis ?Same as Pre-procedure ?Electronic Signature(s) ?Signed: 09/06/2021 3:25:15 PM By: Gretta Cool, BSN, RN, CWS, Kim RN, BSN ?Entered By: Gretta Cool, BSN, RN, CWS, Kim on 09/06/2021 14:55:15 ?[place], [name] ([id number]) ?-------------------------------------------------------------------------------- ?Encounter Discharge Information Details ?Patient Name: [name], [name] ?Date of Service: 09/06/2021 2:00 PM ?Medical Record Number: [id number] ?Patient Account Number: [id number] ?Date of Birth/Sex: [birth year] (71 y.o. M) ?Treating RN: Cornell Barman ?Primary Care Darin Arndt: [place] No Other Clinician: ?Referring Jerah Esty: Felipa Eth ?Treating Kento Gossman/Extender: Jeri Cos ?Weeks in Treatment: 31 ?Encounter Discharge Information Items ?Discharge Condition: Stable ?Ambulatory Status: [name] ?Discharge Destination: Home ?Transportation: Private Auto ?Accompanied By: self ?Schedule Follow-up Appointment: Yes ?Clinical Summary of Care: ?Electronic Signature(s) ?Signed: 09/06/2021 3:25:15 PM By: Gretta Cool, BSN, RN, CWS, Kim RN, BSN ?Entered By: Gretta Cool, BSN, RN, CWS, Kim on 09/06/2021 15:00:39 ?[place], [name]  ([id number]) ?-------------------------------------------------------------------------------- ?Lower Extremity Assessment Details ?Patient Name: [name], [name] ?Date of Service: 09/06/2021 2:00 PM ?Medical Record Number: [id number] ?Patient Account Number: [id number] ?Date of Birth/Sex: [birth year] (71 y.o. M) ?Treating RN: Cornell Barman ?Primary Care Brylei Pedley: [place] No Other Clinician: ?Referring Kapena Hamme: Felipa Eth ?Treating Benton Tooker/Extender: Jeri Cos ?Weeks in Treatment: 31 ?Edema Assessment ?Assessed: [Left: No] [Right: No] ?[Left: Edema] [Right: :] ?Calf ?Left: Right: ?Point of Measurement: 35 cm From Medial Instep 38.5 cm 29 cm ?Ankle ?Left: Right: ?Point of Measurement: 10 cm From Medial Instep 36 cm 28.5 cm ?Vascular Assessment ?Pulses: ?Dorsalis Pedis ?Palpable: [Left:No Yes] [Right:No Yes] ?Electronic Signature(s) ?Signed: 09/06/2021 3:25:15 PM By: Gretta Cool, BSN, RN, CWS,  Maudie Mercury RN, BSN ?Entered By: Gretta Cool, BSN, RN, CWS, Kim on 09/06/2021 14:20:35 ?[place], [name] ([id number]) ?-------------------------------------------------------------------------------- ?Multi Wound Chart Details ?Patient Name: [name], [name] ?Date of Service: 09/06/2021 2:00 PM ?Medical Record Number: [id number] ?Patient Account Number: [id number] ?Date of Birth/Sex: 1951-02-25 (71 y.o. M) ?Treating RN: Cornell Barman ?Primary Care Zaven Klemens: [place] No Other Clinician: ?Referring Keina Mutch: Felipa Eth ?Treating Paton Crum/Extender: Jeri Cos ?Weeks in Treatment: 31 ?Vital Signs ?Height(in): 73 ?Pulse(bpm): 72 ?Weight(lbs): 312 ?Blood Pressure(mmHg): 146/73 ?Body Mass Index(BMI): 41.2 ?Temperature(??F): 97.9 ?Respiratory Rate(breaths/min): 18 ?Wound Assessments ?Treatment Notes ?Electronic Signature(s) ?Signed: 09/06/2021 3:25:15 PM By: Gretta Cool, BSN, RN, CWS, Kim RN, BSN ?Entered By: Gretta Cool, BSN, RN, CWS, Kim on 09/06/2021 91:50:56 ?[place], [name]  ([id number]) ?-------------------------------------------------------------------------------- ?Multi-Disciplinary Care Plan Details ?Patient Name: [name], [name] ?Date of Service: 09/06/2021 2:00 PM ?Medical Record Number: [id number] ?Patient Account Number: [id number] ?Date of Birth/Sex: 10-Feb-1951 (71 y.o. M) ?Treating RN: Cornell Barman ?Primary Care Deryl Giroux: [place] No Other Clinician: ?Referring Reason Helzer: Felipa Eth ?Treating Mical Kicklighter/Extender: Jeri Cos ?Weeks in Treatment: 31 ?Active Inactive ?Soft Tissue Infection ?Nursing Diagnoses: ?Impaired tissue integrity ?Knowledge deficit related to disease process and management ?Knowledge deficit related to home infection control: handwashing, handling of soiled dressings, supply storage ?Potential for infection: soft tissue ?Goals: ?Patient/caregiver will verbalize understanding of or measures to prevent infection and contamination in the home setting ?Date Initiated: 09/06/2021 ?Target Resolution Date: 09/06/2021 ?Goal Status: Active ?Patient's soft tissue infection will resolve ?Date Initiated: 09/06/2021 ?Target Resolution Date: 09/06/2021 ?Goal Status: Active ?Signs and symptoms of infection will be recognized early to allow for prompt treatment ?Date Initiated: 09/06/2021 ?Target Resolution Date: 09/06/2021 ?Goal Status: Active ?Interventions: ?Assess signs and symptoms of infection every visit ?Provide education on infection ?Treatment Activities: ?Systemic antibiotics : 09/06/2021 ?Notes: ?Electronic Signature(s) ?Signed: 09/06/2021 3:25:15 PM By: Gretta Cool, BSN, RN, CWS, Kim RN, BSN ?Entered By: Gretta Cool, BSN, RN, CWS, Kim on 09/06/2021 14:28:14 ?[place], [name] ([id number]) ?-------------------------------------------------------------------------------- ?Non-Wound Condition Assessment Details ?Patient Name: [name], [name] ?Date of Service: 09/06/2021 2:00 PM ?Medical Record Number: [id number] ?Patient Account Number: [id number] ?Date of Birth/Gender: 11-05-1950 (71 y.o.  M) ?Treating RN: Cornell Barman ?Primary Care Physician: [place] No Other Clinician: ?Referring Physician: Felipa Eth ?Treating Physician/Extender: Jeri Cos ?Weeks in Treatment: 31 ?Non-Wound Condition: ?Condition: Lymphedema ?Location: Leg ?Side: Left ?Notes: Anterior aspect of lower leg ?Photos ?Electronic Signature(s) ?Signed: 09/06/2021 3:25:15

## 2021-09-06 NOTE — Progress Notes (Addendum)
[name], [name] ([id number]) ?Visit Report for 09/06/2021 ?Chief Complaint Document Details ?Patient Name: [name], [name] ?Date of Service: 09/06/2021 2:00 PM ?Medical Record Number: [id number] ?Patient Account Number: [id number] ?Date of Birth/Sex: April 19, 1951 (71 y.o. M) ?Treating RN: Cornell Barman ?Primary Care Provider: [place] No Other Clinician: ?Referring Provider: Felipa Eth ?Treating Provider/Extender: Jeri Cos ?Weeks in Treatment: 31 ?Information Obtained from: Patient ?Chief Complaint ?Left LE ulcers ?Electronic Signature(s) ?Signed: 09/06/2021 1:59:02 PM By: Worthy Keeler PA-C ?Entered By: Worthy Keeler on 09/06/2021 13:59:02 ?[place], [name] ([id number]) ?-------------------------------------------------------------------------------- ?HPI Details ?Patient Name: [name], Mooney ?Date of Service: 09/06/2021 2:00 PM ?Medical Record Number: [id number] ?Patient Account Number: [id number] ?Date of Birth/Sex: 23-May-1950 (71 y.o. M) ?Treating RN: Cornell Barman ?Primary Care Provider: [place] No Other Clinician: ?Referring Provider: Felipa Eth ?Treating Provider/Extender: Jeri Cos ?Weeks in Treatment: 31 ?History of Present Illness ?HPI Description: 71 year old male who presented to the ER with bilateral lower extremity blisters which had started last week. he has a past ?medical history of leukemia, diabetes mellitus, hypertension, edema of both lower extremities, his recurrent skin infections, peripheral vascular ?disease, coronary artery disease, congestive heart failure and peripheral neuropathy. in the ER he was given Rocephin and put on Silvadene ?cream. he was put on oral doxycycline and was asked to follow-up with the [facility]. ?His last hemoglobin A1c was 6.6 in December and he checks his blood sugar once a week. He does not have any physicians outside the [place] ?system. ?He does not recall any vascular duplex studies done either for arterial or venous disease but was told to wear compression  stockings which he ?does not use ?05/30/2016 -- we have not yet received any of his notes from the [facility] hospital system and his arterial and venous duplex studies are scheduled ?here in [place] around mid February. We are unable to have his insurance accepted by home health agencies and hence he is getting dressings ?only once a week. ?06/06/16 -- -- I received a call from the patient's PCP at the [facility] at [facility] and spoke to Dr. Garvin Fila, phone number [phone] and fax ?number [phone]. ?She confirmed that no vascular testing was done over the last 5 years and she would be happy to do them if the patient did want them to be done ?at the [place] and we could fax him a request. ?Readmission: ?71 year old male seen by as in February of this year and was referred to vein and vascular for studies and opinion from the vascular surgeons. ?The patient returns today with a fresh problem having had blisters on his left lower extremity which have been there for about 5 days and he ?clearly states that he has been wearing his compression stockings as advised though he could not read the moderate compression and has been ?wearing light compression. ?Review of his electronic medical records note that he had lower extremity arterial duplex examination done on 06/23/2016 which showed no ?hemodynamically significant stenosis in the bilateral lower extremity arterial system. ?He also had a lower extremity venous reflux examination done on 07/07/2016 and it was noted that he had venous incompetence in the right ?great saphenous vein and bilateral common femoral veins. ?Patient was seen by Dr. Tamala Julian on the same day and for some reason his notes do not reflect the venous studies or the arterial studies and he ?recommended patient do a venous duplex ultrasound to look for reflux and return to see him.he would also consider a lymph pump if required. ?The patient was told that his workup  was normal and hence the patient canceled his  follow-up appointment. ?02/03/17 on evaluation today patient left medial lower extremity blister appears to be doing about the same. It is still continuing to drain and ?there's still the blistered skin covering the wound bed which is making it difficult for the alternate to do its job. Fortunately there is no evidence of ?cellulitis. No fevers chills noted. Patient states in general he is not having any significant discomfort. ?Patient's lower extremity arterial duplex exam revealed that patient was hemodynamically stable with no evidence of stenosis in regard to the ?bilateral lower extremities. ?The lower extremity venous reflux exam revealed the patient had venous incontinence noted in the right greater saphenous and bilateral common ?femoral vein. There is no evidence of deep or superficial vein thrombosis in the bilateral lower extremities. ?Readmission: ?11/12/18 Patient presents for evaluation our clinic today concerning issues that he is having with his left lower extremity. He tells me that a couple ?weeks ago he began developing blisters on the left lower extremity along with increased swelling. He typically wears his compression stockings on ?a regular basis is previously been evaluated both here as well is with vascular surgery they would recommend lymphedema pumps but ?unfortunately that somehow fell through and he never heard anything back from that. Nonetheless I think lymphedema pumps would be beneficial ?for this patient. He does have a history of hypertension and diabetes. Obviously the chronic venous stasis and lymphedema as well. At this point ?the blisters have been given in more trouble he states sometimes when the blisters openings able to clean it down with alcohol and it will dry out ?and do well. Unfortunately that has not been the case this time. He is having some discomfort although this mean these with cleaning the areas ?he doesn't have discomfort just on a regular basis. He has not been  able to wear his compression stockings since the blisters arose due to the fact ?that of course it will drain into the socks causing additional issues and he didn't have any way to wrap this otherwise. He has increased to taking ?his Lasix every day instead of every other day. He sees his primary care provider later this month as well. No fevers, chills, nausea, or vomiting ?noted at this time. ?11/19/18-Patient returns at 1 week, per intake RN the amount of seepage into the compression wraps was definitely improved, overall all the ?wounds are measuring smaller but continuing silver alginate to the wounds as primary dressing ?11/26/18 on evaluation today patient appears to be doing quite well in regard to his left lower [place] ulcers. In fact of the areas that were noted ?initially he only has two regions still open. There is no evidence of active infection at this time. He still is not heard anything from the company ?regarding lymphedema pumps as of yet. Again as previously seen vascular they have not recommended any surgical intervention. ?12/03/2018 on evaluation today patient actually appears to be doing quite well with regard to his lower extremity ulcers. In fact most of the areas ?appear to be healed the one spot which does not seem to be completely healed I am unsure of whether or not this is really draining that much but ?nonetheless there does not appear to be any signs of infection or significant drainage at this point. There is no sign of fever, chills, nausea, ?vomiting, or diarrhea. Overall I am pleased with how things have progressed I think is very close to being able to transition  to his home ?compression stockings. ARTEMIO, DOBIE ([id number]) ?12/10/2018 upon evaluation today patient appears to be doing quite well with regard to his left lower extremity. He has been tolerating the dressing ?changes without complication. Fortunately there is no signs of active infection at this time. He appears after  thorough evaluation of his leg to only ?have 1 small area that remains open at this point everything else appears to be almost completely closed. He still have significant swelling of the ?left lower

## 2021-09-13 ENCOUNTER — Encounter: Payer: PPO | Admitting: Internal Medicine

## 2021-09-13 DIAGNOSIS — E11622 Type 2 diabetes mellitus with other skin ulcer: Secondary | ICD-10-CM | POA: Diagnosis not present

## 2021-09-15 NOTE — Progress Notes (Signed)
[place], [name] ([id number]) ?Visit Report for 09/13/2021 ?Arrival Information Details ?Patient Name: [name], [name] ?Date of Service: 09/13/2021 2:00 PM ?Medical Record Number: [id number] ?Patient Account Number: [id number] ?Date of Birth/Sex: Nov 28, 1950 (71 y.o. M) ?Treating RN: [name] ?Primary Care Kianah Harries: [place] No Other Clinician: ?Referring Malli Falotico: Felipa Eth ?Treating Garry Nicolini/Extender: Ricard Dillon ?Weeks in Treatment: 32 ?Visit Information History Since Last Visit ?All ordered tests and consults were completed: No ?Patient Arrived: Ambulatory ?Added or deleted any medications: No ?Arrival Time: 14:05 ?Any new allergies or adverse reactions: No ?Accompanied By: self ?Had a fall or experienced change in No ?Transfer Assistance: None ?activities of daily living that may affect ?Patient Identification Verified: Yes ?risk of falls: ?Secondary Verification Process Completed: Yes ?Signs or symptoms of abuse/neglect since last visito No ?Patient Requires Transmission-Based No ?Hospitalized since last visit: No ?Precautions: ?Implantable device outside of the clinic excluding No ?Patient Has Alerts: Yes ?cellular tissue based products placed in the center ?Patient Alerts: AVVS consult on file ?since last visit: ?Last ABI-R 1.09; L ?Has Dressing in Place as Prescribed: Yes ?1.04 ?Has Compression in Place as Prescribed: Yes ?Pain Present Now: No ?Electronic Signature(s) ?Signed: 09/15/2021 3:40:11 PM By: [name] RN ?Entered By: [name] on 09/13/2021 14:09:46 ?[place], [name] ([id number]) ?-------------------------------------------------------------------------------- ?Clinic Level of Care Assessment Details ?Patient Name: [name], [name] ?Date of Service: 09/13/2021 2:00 PM ?Medical Record Number: [id number] ?Patient Account Number: [id number] ?Date of Birth/Sex: 07-08-1950 (71 y.o. M) ?Treating RN: [name] ?Primary Care Zamara Cozad: [place] No Other Clinician: ?Referring Syrah Daughtrey:  Felipa Eth ?Treating Nephtali Docken/Extender: Ricard Dillon ?Weeks in Treatment: 32 ?Clinic Level of Care Assessment Items ?TOOL 1 Quantity Score ?'[]'$  - Use when EandM and Procedure is performed on INITIAL visit 0 ?ASSESSMENTS - Nursing Assessment / Reassessment ?'[]'$  - General Physical Exam (combine w/ comprehensive assessment (listed just below) when performed on new ?0 ?pt. evals) ?'[]'$  - 0 ?Comprehensive Assessment (HX, ROS, Risk Assessments, Wounds Hx, etc.) ?ASSESSMENTS - Wound and Skin Assessment / Reassessment ?'[]'$  - Dermatologic / Skin Assessment (not related to wound area) 0 ?ASSESSMENTS - Ostomy and/or Continence Assessment and Care ?'[]'$  - Incontinence Assessment and Management 0 ?'[]'$  - 0 ?Ostomy Care Assessment and Management (repouching, etc.) ?PROCESS - Coordination of Care ?'[]'$  - Simple Patient / Family Education for ongoing care 0 ?'[]'$  - 0 ?Complex (extensive) Patient / Family Education for ongoing care ?'[]'$  - 0 ?Staff obtains Consents, Records, Test Results / Process Orders ?'[]'$  - 0 ?Staff telephones [place], Nursing Homes / Clarify orders / etc ?'[]'$  - 0 ?Routine Transfer to another Facility (non-emergent condition) ?'[]'$  - 0 ?Routine Hospital Admission (non-emergent condition) ?'[]'$  - 0 ?New Admissions / Biomedical engineer / Ordering NPWT, Apligraf, etc. ?'[]'$  - 0 ?Emergency Hospital Admission (emergent condition) ?PROCESS - Special Needs ?'[]'$  - Pediatric / Minor Patient Management 0 ?'[]'$  - 0 ?Isolation Patient Management ?'[]'$  - 0 ?Hearing / Language / Visual special needs ?'[]'$  - 0 ?Assessment of Community assistance (transportation, D/C planning, etc.) ?'[]'$  - 0 ?Additional assistance / Altered mentation ?'[]'$  - 0 ?Support Surface(s) Assessment (bed, cushion, seat, etc.) ?INTERVENTIONS - Miscellaneous ?'[]'$  - External ear exam 0 ?'[]'$  - 0 ?Patient Transfer (multiple staff / Civil Service fast streamer / Similar devices) ?'[]'$  - 0 ?Simple Staple / Suture removal (25 or less) ?'[]'$  - 0 ?Complex Staple / Suture removal (26 or more) ?'[]'$  -  0 ?Hypo/Hyperglycemic Management (do not check if billed separately) ?'[]'$  - 0 ?Ankle / Brachial Index (ABI) - do not check if billed separately ?Has the  patient been seen at the hospital within the last three years: Yes ?Total Score: 0 ?Level Of Care: ____ ?[place], [name] ([id number]) ?Electronic Signature(s) ?Signed: 09/15/2021 3:40:11 PM By: [name] RN ?Entered By: [name] on 09/13/2021 15:04:24 ?[place], [name] ([id number]) ?-------------------------------------------------------------------------------- ?Compression Therapy Details ?Patient Name: [name], [name] ?Date of Service: 09/13/2021 2:00 PM ?Medical Record Number: [id number] ?Patient Account Number: [id number] ?Date of Birth/Sex: [birth year] (71 y.o. M) ?Treating RN: [name] ?Primary Care Brayn Eckstein: [place] No Other Clinician: ?Referring Albana Saperstein: Felipa Eth ?Treating Ayomikun Starling/Extender: Ricard Dillon ?Weeks in Treatment: 32 ?Compression Therapy Performed for Wound Assessment: NonWound Condition Lymphedema - Left Leg ?Performed By: Clinician [name], RN ?Compression Type: Four Layer ?Post Procedure Diagnosis ?Same as Pre-procedure ?Electronic Signature(s) ?Signed: 09/15/2021 3:40:11 PM By: [name] RN ?Entered By: [name] on 09/13/2021 15:04:04 ?[place], [name] ([id number]) ?-------------------------------------------------------------------------------- ?Compression Therapy Details ?Patient Name: [name], [name] ?Date of Service: 09/13/2021 2:00 PM ?Medical Record Number: [id number] ?Patient Account Number: [id number] ?Date of Birth/Sex: [birth year] (71 y.o. M) ?Treating RN: [name] ?Primary Care Johonna Binette: [place] No Other Clinician: ?Referring Takeila Thayne: Felipa Eth ?Treating Elsey Holts/Extender: Ricard Dillon ?Weeks in Treatment: 32 ?Compression Therapy Performed for Wound Assessment: NonWound Condition Lymphedema - Right Leg ?Performed By: Clinician [name], RN ?Compression Type: Four Layer ?Post  Procedure Diagnosis ?Same as Pre-procedure ?Electronic Signature(s) ?Signed: 09/15/2021 3:40:11 PM By: [name] RN ?Entered By: [name] on 09/13/2021 15:04:16 ?[place], [name] ([id number]) ?-------------------------------------------------------------------------------- ?Encounter Discharge Information Details ?Patient Name: [name], [name] ?Date of Service: 09/13/2021 2:00 PM ?Medical Record Number: [id number] ?Patient Account Number: [id number] ?Date of Birth/Sex: 09/18/1950 (71 y.o. M) ?Treating RN: [name] ?Primary Care Zamiya Dillard: [place] No Other Clinician: ?Referring Aziya Arena: Felipa Eth ?Treating Kapono Luhn/Extender: Ricard Dillon ?Weeks in Treatment: 32 ?Encounter Discharge Information Items ?Discharge Condition: Stable ?Ambulatory Status: [name] ?Discharge Destination: Home ?Transportation: Private Auto ?Accompanied By: self ?Schedule Follow-up Appointment: Yes ?Clinical Summary of Care: Patient Declined ?Electronic Signature(s) ?Signed: 09/15/2021 3:40:11 PM By: [name] RN ?Entered By: [name] on 09/13/2021 15:05:17 ?[place], [name] ([id number]) ?-------------------------------------------------------------------------------- ?Lower Extremity Assessment Details ?Patient Name: [name], [name] ?Date of Service: 09/13/2021 2:00 PM ?Medical Record Number: [id number] ?Patient Account Number: [id number] ?Date of Birth/Sex: 1951-02-19 (71 y.o. M) ?Treating RN: [name] ?Primary Care Arnola Crittendon: [place] No Other Clinician: ?Referring Ayeshia Coppin: Felipa Eth ?Treating Lexander Tremblay/Extender: Ricard Dillon ?Weeks in Treatment: 32 ?Edema Assessment ?Assessed: [Left: No] [Right: No] ?Edema: [Left: Yes] [Right: Yes] ?Calf ?Left: Right: ?Point of Measurement: 35 cm From Medial Instep 38 cm 29 cm ?Ankle ?Left: Right: ?Point of Measurement: 10 cm From Medial Instep 26 cm 28.2 cm ?Electronic Signature(s) ?Signed: 09/15/2021 3:40:11 PM By: [name] RN ?Entered By: [name] on 09/13/2021  14:23:36 ?[name], [name] ([id number]) ?-------------------------------------------------------------------------------- ?Multi Wound Chart Details ?Patient Name: [name], [name] ?Date of Service: 09/13/2021 2:00 PM ?Medical

## 2021-09-15 NOTE — Progress Notes (Signed)
[place], [name] ([id number]) ?Visit Report for 09/13/2021 ?HPI Details ?Patient Name: [name], [name] ?Date of Service: 09/13/2021 2:00 PM ?Medical Record Number: [id number] ?Patient Account Number: [id number] ?Date of Birth/Sex: [birth year] (71 y.o. M) ?Treating RN: [name] ?Primary Care Provider: Fontaine No Other Clinician: ?Referring Provider: Felipa Eth ?Treating Provider/Extender: Ricard Dillon ?Weeks in Treatment: 32 ?History of Present Illness ?HPI Description: 71 year old male who presented to the ER with bilateral lower extremity blisters which had started last week. he has a past ?medical history of leukemia, diabetes mellitus, hypertension, edema of both lower extremities, his recurrent skin infections, peripheral vascular ?disease, coronary artery disease, congestive heart failure and peripheral neuropathy. in the ER he was given Rocephin and put on Silvadene ?cream. he was put on oral doxycycline and was asked to follow-up with the [facility]. ?His last hemoglobin A1c was 6.6 in December and he checks his blood sugar once a week. He does not have any physicians outside the [place] ?system. ?He does not recall any vascular duplex studies done either for arterial or venous disease but was told to wear compression stockings which he ?does not use ?05/30/2016 -- we have not yet received any of his notes from the [facility] hospital system and his arterial and venous duplex studies are scheduled ?here in [place] around mid February. We are unable to have his insurance accepted by home health agencies and hence he is getting dressings ?only once a week. ?06/06/16 -- -- I received a call from the patient's PCP at the [facility] at [facility] and spoke to Dr. Garvin Fila, phone number [phone] and fax ?number [phone]. ?She confirmed that no vascular testing was done over the last 5 years and she would be happy to do them if the patient did want them to be done ?at the [place] and we could fax him a  request. ?Readmission: ?71 year old male seen by as in February of this year and was referred to vein and vascular for studies and opinion from the vascular surgeons. ?The patient returns today with a fresh problem having had blisters on his left lower extremity which have been there for about 5 days and he ?clearly states that he has been wearing his compression stockings as advised though he could not read the moderate compression and has been ?wearing light compression. ?Review of his electronic medical records note that he had lower extremity arterial duplex examination done on 06/23/2016 which showed no ?hemodynamically significant stenosis in the bilateral lower extremity arterial system. ?He also had a lower extremity venous reflux examination done on 07/07/2016 and it was noted that he had venous incompetence in the right ?great saphenous vein and bilateral common femoral veins. ?Patient was seen by Dr. Tamala Julian on the same day and for some reason his notes do not reflect the venous studies or the arterial studies and he ?recommended patient do a venous duplex ultrasound to look for reflux and return to see him.he would also consider a lymph pump if required. ?The patient was told that his workup was normal and hence the patient canceled his follow-up appointment. ?02/03/17 on evaluation today patient left medial lower extremity blister appears to be doing about the same. It is still continuing to drain and ?there's still the blistered skin covering the wound bed which is making it difficult for the alternate to do its job. Fortunately there is no evidence of ?cellulitis. No fevers chills noted. Patient states in general he is not having any significant discomfort. ?Patient's lower extremity arterial duplex exam  revealed that patient was hemodynamically stable with no evidence of stenosis in regard to the ?bilateral lower extremities. ?The lower extremity venous reflux exam revealed the patient had venous  incontinence noted in the right greater saphenous and bilateral common ?femoral vein. There is no evidence of deep or superficial vein thrombosis in the bilateral lower extremities. ?Readmission: ?11/12/18 Patient presents for evaluation our clinic today concerning issues that he is having with his left lower extremity. He tells me that a couple ?weeks ago he began developing blisters on the left lower extremity along with increased swelling. He typically wears his compression stockings on ?a regular basis is previously been evaluated both here as well is with vascular surgery they would recommend lymphedema pumps but ?unfortunately that somehow fell through and he never heard anything back from that. Nonetheless I think lymphedema pumps would be beneficial ?for this patient. He does have a history of hypertension and diabetes. Obviously the chronic venous stasis and lymphedema as well. At this point ?the blisters have been given in more trouble he states sometimes when the blisters openings able to clean it down with alcohol and it will dry out ?and do well. Unfortunately that has not been the case this time. He is having some discomfort although this mean these with cleaning the areas ?he doesn't have discomfort just on a regular basis. He has not been able to wear his compression stockings since the blisters arose due to the fact ?that of course it will drain into the socks causing additional issues and he didn't have any way to wrap this otherwise. He has increased to taking ?his Lasix every day instead of every other day. He sees his primary care provider later this month as well. No fevers, chills, nausea, or vomiting ?noted at this time. ?11/19/18-Patient returns at 1 week, per intake RN the amount of seepage into the compression wraps was definitely improved, overall all the ?wounds are measuring smaller but continuing silver alginate to the wounds as primary dressing ?11/26/18 on evaluation today patient  appears to be doing quite well in regard to his left lower Trinity ulcers. In fact of the areas that were noted ?initially he only has two regions still open. There is no evidence of active infection at this time. He still is not heard anything from the company ?regarding lymphedema pumps as of yet. Again as previously seen vascular they have not recommended any surgical intervention. ?12/03/2018 on evaluation today patient actually appears to be doing quite well with regard to his lower extremity ulcers. In fact most of the areas ?[place], [name] ([id number]) ?appear to be healed the one spot which does not seem to be completely healed I am unsure of whether or not this is really draining that much but ?nonetheless there does not appear to be any signs of infection or significant drainage at this point. There is no sign of fever, chills, nausea, ?vomiting, or diarrhea. Overall I am pleased with how things have progressed I think is very close to being able to transition to his home ?compression stockings. ?12/10/2018 upon evaluation today patient appears to be doing quite well with regard to his left lower extremity. He has been tolerating the dressing ?changes without complication. Fortunately there is no signs of active infection at this time. He appears after thorough evaluation of his leg to only ?have 1 small area that remains open at this point everything else appears to be almost completely closed. He still have significant swelling of the ?left lower  extremity. We had discussed discussing this with his primary care provider he is not able to see her in person they were at the [place] [facility] and right now the [place] is not seeing patients on site. According to the patient anyway. Subsequently he did speak with her apparently and ?his primary care provider feels that he may likely have a DVT. With that being said she has not seen his leg she is just going off of his history. ?Nonetheless that is a concern that the  patient now has as well and while I do not feel the DVT is likely we can definitely ensure that that is not the ?case I will go ahead and see about putting that order in today. Nonetheless otherwise I am in a recommend that we continu

## 2021-09-16 ENCOUNTER — Encounter: Payer: Self-pay | Admitting: Podiatry

## 2021-09-16 ENCOUNTER — Ambulatory Visit: Payer: PPO | Admitting: Podiatry

## 2021-09-16 DIAGNOSIS — I872 Venous insufficiency (chronic) (peripheral): Secondary | ICD-10-CM

## 2021-09-16 DIAGNOSIS — I890 Lymphedema, not elsewhere classified: Secondary | ICD-10-CM

## 2021-09-16 DIAGNOSIS — M79675 Pain in left toe(s): Secondary | ICD-10-CM

## 2021-09-16 DIAGNOSIS — M79674 Pain in right toe(s): Secondary | ICD-10-CM

## 2021-09-16 DIAGNOSIS — B351 Tinea unguium: Secondary | ICD-10-CM

## 2021-09-16 DIAGNOSIS — E11628 Type 2 diabetes mellitus with other skin complications: Secondary | ICD-10-CM

## 2021-09-16 DIAGNOSIS — N179 Acute kidney failure, unspecified: Secondary | ICD-10-CM

## 2021-09-16 NOTE — Progress Notes (Signed)
This patient returns to my office for at risk foot care.  This patient requires this care by a professional since this patient will be at risk due to having diabetes and venous insufficiency.  This patient is unable to cut nails himself since the patient cannot reach his nails.These nails are painful walking and wearing shoes.  Patient is wearing unna boots  B/L for his severe lymphedema.. This patient presents for at risk foot care today.  General Appearance  Alert, conversant and in no acute stress.  Vascular  Deferred due to unna boots.  Neurologic  Deferred due to unna boots.  Nails Thick disfigured discolored nails with subungual debris  from hallux to fifth toes bilaterally. No evidence of bacterial infection or drainage bilaterally.  Orthopedic  No limitations of motion  feet .  No crepitus or effusions noted.  No bony pathology or digital deformities noted.  Skin  normotropic skin with no porokeratosis noted bilaterally.  No signs of infections or ulcers noted.     Onychomycosis  Pain in right toes  Pain in left toes  Consent was obtained for treatment procedures.   Mechanical debridement of nails 1-5  bilaterally performed with a nail nipper.  Filed with dremel without incident.  .   Return office visit    3 months                 Told patient to return for periodic foot care and evaluation due to potential at risk complications.   Eltha Tingley DPM  

## 2021-09-20 ENCOUNTER — Encounter: Payer: PPO | Admitting: Physician Assistant

## 2021-09-20 DIAGNOSIS — E11622 Type 2 diabetes mellitus with other skin ulcer: Secondary | ICD-10-CM | POA: Diagnosis not present

## 2021-09-20 NOTE — Progress Notes (Addendum)
[place], [name] ([id number]) ?Visit Report for 09/20/2021 ?Chief Complaint Document Details ?Patient Name: [name], [name] ?Date of Service: 09/20/2021 2:00 PM ?Medical Record Number: [id number] ?Patient Account Number: [id number] ?Date of Birth/Sex: Oct 10, 1950 (71 y.o. Male) ?Treating RN: [name] ?Primary Care Provider: Fontaine No Other Clinician: ?Referring Provider: Eulogio Ditch ?Treating Provider/Extender: Jeri Cos ?Weeks in Treatment: 33 ?Information Obtained from: Patient ?Chief Complaint ?Left LE ulcers ?Electronic Signature(s) ?Signed: 09/20/2021 2:33:20 PM By: Worthy Keeler PA-C ?Entered By: Worthy Keeler on 09/20/2021 14:33:19 ?[place], [name] ([id number]) ?-------------------------------------------------------------------------------- ?HPI Details ?Patient Name: [name] ?Date of Service: 09/20/2021 2:00 PM ?Medical Record Number: [id number] ?Patient Account Number: [id number] ?Date of Birth/Sex: 16-May-1950 (71 y.o. Male) ?Treating RN: [name] ?Primary Care Provider: Fontaine No Other Clinician: ?Referring Provider: Eulogio Ditch ?Treating Provider/Extender: Jeri Cos ?Weeks in Treatment: 33 ?History of Present Illness ?HPI Description: 71 year old male who presented to the ER with bilateral lower extremity blisters which had started last week. he has a past ?medical history of leukemia, diabetes mellitus, hypertension, edema of both lower extremities, his recurrent skin infections, peripheral vascular ?disease, coronary artery disease, congestive heart failure and peripheral neuropathy. in the ER he was given Rocephin and put on Silvadene ?cream. he was put on oral doxycycline and was asked to follow-up with the [facility]. ?His last hemoglobin A1c was 6.6 in December and he checks his blood sugar once a week. He does not have any physicians outside the [place] ?system. ?He does not recall any vascular duplex studies done either for arterial or venous disease but was told to wear  compression stockings which he ?does not use ?05/30/2016 -- we have not yet received any of his notes from the [facility] hospital system and his arterial and venous duplex studies are scheduled ?here in [place] around mid February. We are unable to have his insurance accepted by home health agencies and hence he is getting dressings ?only once a week. ?06/06/16 -- -- I received a call from the patient's PCP at the [facility] at [facility] and spoke to Dr. Garvin Fila, phone number [phone] and fax ?number [phone]. ?She confirmed that no vascular testing was done over the last 5 years and she would be happy to do them if the patient did want them to be done ?at the [place] and we could fax him a request. ?Readmission: ?71 year old male seen by as in February of this year and was referred to vein and vascular for studies and opinion from the vascular surgeons. ?The patient returns today with a fresh problem having had blisters on his left lower extremity which have been there for about 5 days and he ?clearly states that he has been wearing his compression stockings as advised though he could not read the moderate compression and has been ?wearing light compression. ?Review of his electronic medical records note that he had lower extremity arterial duplex examination done on 06/23/2016 which showed no ?hemodynamically significant stenosis in the bilateral lower extremity arterial system. ?He also had a lower extremity venous reflux examination done on 07/07/2016 and it was noted that he had venous incompetence in the right ?great saphenous vein and bilateral common femoral veins. ?Patient was seen by Dr. Tamala Julian on the same day and for some reason his notes do not reflect the venous studies or the arterial studies and he ?recommended patient do a venous duplex ultrasound to look for reflux and return to see him.he would also consider a lymph pump if required. ?The patient was told that his workup  was normal and hence the patient  canceled his follow-up appointment. ?02/03/17 on evaluation today patient left medial lower extremity blister appears to be doing about the same. It is still continuing to drain and ?there's still the blistered skin covering the wound bed which is making it difficult for the alternate to do its job. Fortunately there is no evidence of ?cellulitis. No fevers chills noted. Patient states in general he is not having any significant discomfort. ?Patient's lower extremity arterial duplex exam revealed that patient was hemodynamically stable with no evidence of stenosis in regard to the ?bilateral lower extremities. ?The lower extremity venous reflux exam revealed the patient had venous incontinence noted in the right greater saphenous and bilateral common ?femoral vein. There is no evidence of deep or superficial vein thrombosis in the bilateral lower extremities. ?Readmission: ?11/12/18 Patient presents for evaluation our clinic today concerning issues that he is having with his left lower extremity. He tells me that a couple ?weeks ago he began developing blisters on the left lower extremity along with increased swelling. He typically wears his compression stockings on ?a regular basis is previously been evaluated both here as well is with vascular surgery they would recommend lymphedema pumps but ?unfortunately that somehow fell through and he never heard anything back from that. Nonetheless I think lymphedema pumps would be beneficial ?for this patient. He does have a history of hypertension and diabetes. Obviously the chronic venous stasis and lymphedema as well. At this point ?the blisters have been given in more trouble he states sometimes when the blisters openings able to clean it down with alcohol and it will dry out ?and do well. Unfortunately that has not been the case this time. He is having some discomfort although this mean these with cleaning the areas ?he doesn't have discomfort just on a regular basis. He  has not been able to wear his compression stockings since the blisters arose due to the fact ?that of course it will drain into the socks causing additional issues and he didn't have any way to wrap this otherwise. He has increased to taking ?his Lasix every day instead of every other day. He sees his primary care provider later this month as well. No fevers, chills, nausea, or vomiting ?noted at this time. ?11/19/18-Patient returns at 1 week, per intake RN the amount of seepage into the compression wraps was definitely improved, overall all the ?wounds are measuring smaller but continuing silver alginate to the wounds as primary dressing ?11/26/18 on evaluation today patient appears to be doing quite well in regard to his left lower Trinity ulcers. In fact of the areas that were noted ?initially he only has two regions still open. There is no evidence of active infection at this time. He still is not heard anything from the company ?regarding lymphedema pumps as of yet. Again as previously seen vascular they have not recommended any surgical intervention. ?12/03/2018 on evaluation today patient actually appears to be doing quite well with regard to his lower extremity ulcers. In fact most of the areas ?appear to be healed the one spot which does not seem to be completely healed I am unsure of whether or not this is really draining that much but ?nonetheless there does not appear to be any signs of infection or significant drainage at this point. There is no sign of fever, chills, nausea, ?vomiting, or diarrhea. Overall I am pleased with how things have progressed I think is very close to being able to transition  to his home ?compression stockings. ORLIE, CUNDARI ([id number]) ?12/10/2018 upon evaluation today patient appears to be doing quite well with regard to his left lower extremity. He has been tolerating the dressing ?changes without complication. Fortunately there is no signs of active infection at this time. He  appears after thorough evaluation of his leg to only ?have 1 small area that remains open at this point everything else appears to be almost completely closed. He still have significant swelling of the ?le

## 2021-09-21 NOTE — Progress Notes (Signed)
[place], [name] ?Visit Report for 09/20/2021 ?Arrival Information Details ?Patient Name: [name], [name] ?Date of Service: 09/20/2021 2:00 PM ?Medical Record Number: [id number] ?Patient Account Number: [id number] ?Date of Birth/Sex: 03/04/1951 (71 y.o. Male) ?Treating RN: [name] ?Primary Care Necie Wilcoxson: [place] No Other Clinician: ?Referring Alanta Scobey: Eulogio Ditch ?Treating Anniebell Bedore/Extender: Jeri Cos ?Weeks in Treatment: 33 ?Visit Information History Since Last Visit ?All ordered tests and consults were completed: No ?Patient Arrived: [name] ?Added or deleted any medications: No ?Arrival Time: 14:19 ?Any new allergies or adverse reactions: No ?Accompanied By: self ?Had a fall or experienced change in No ?Transfer Assistance: None ?activities of daily living that may affect ?Patient Identification Verified: Yes ?risk of falls: ?Secondary Verification Process Completed: Yes ?Signs or symptoms of abuse/neglect since last visito No ?Patient Requires Transmission-Based No ?Hospitalized since last visit: No ?Precautions: ?Implantable device outside of the clinic excluding No ?Patient Has Alerts: Yes ?cellular tissue based products placed in the center ?Patient Alerts: AVVS consult on file ?since last visit: ?Last ABI-R 1.09; L ?Has Dressing in Place as Prescribed: Yes ?1.04 ?Pain Present Now: No ?Electronic Signature(s) ?Signed: 09/21/2021 10:35:39 AM By: [name] RN ?Entered By: [name] on 09/20/2021 14:20:47 ?[place], [name] ?-------------------------------------------------------------------------------- ?Clinic Level of Care Assessment Details ?Patient Name: [name], [name] ?Date of Service: 09/20/2021 2:00 PM ?Medical Record Number: [id number] ?Patient Account Number: [id number] ?Date of Birth/Sex: April 29, 1951 (71 y.o. Male) ?Treating RN: [name] ?Primary Care Lavell Supple: [place] No Other Clinician: ?Referring Collins Dimaria: Eulogio Ditch ?Treating Uno Esau/Extender: Jeri Cos ?Weeks in Treatment: 33 ?Clinic Level of Care Assessment Items ?TOOL 1 Quantity Score ?'[]'$  - Use when EandM and Procedure is performed on INITIAL visit 0 ?ASSESSMENTS - Nursing Assessment / Reassessment ?'[]'$  - General Physical Exam (combine w/ comprehensive assessment (listed just below) when performed on new ?0 ?pt. evals) ?'[]'$  - 0 ?Comprehensive Assessment (HX, ROS, Risk Assessments, Wounds Hx, etc.) ?ASSESSMENTS - Wound and Skin Assessment / Reassessment ?'[]'$  - Dermatologic / Skin Assessment (not related to wound area) 0 ?ASSESSMENTS - Ostomy and/or Continence Assessment and Care ?'[]'$  - Incontinence Assessment and Management 0 ?'[]'$  - 0 ?Ostomy Care Assessment and Management (repouching, etc.) ?PROCESS - Coordination of Care ?'[]'$  - Simple Patient / Family Education for ongoing care 0 ?'[]'$  - 0 ?Complex (extensive) Patient / Family Education for ongoing care ?'[]'$  - 0 ?Staff obtains Consents, Records, Test Results / Process Orders ?'[]'$  - 0 ?Staff telephones HHA, Nursing Homes / Clarify orders / etc ?'[]'$  - 0 ?Routine Transfer to another Facility (non-emergent condition) ?'[]'$  - 0 ?Routine Hospital Admission (non-emergent condition) ?'[]'$  - 0 ?New Admissions / Biomedical engineer / Ordering NPWT, Apligraf, etc. ?'[]'$  - 0 ?Emergency Hospital Admission (emergent condition) ?PROCESS - Special Needs ?'[]'$  - Pediatric / Minor Patient Management 0 ?'[]'$  - 0 ?Isolation Patient Management ?'[]'$  - 0 ?Hearing / Language / Visual special needs ?'[]'$  - 0 ?Assessment of Community assistance (transportation, D/C planning, etc.) ?'[]'$  - 0 ?Additional assistance / Altered mentation ?'[]'$  - 0 ?Support Surface(s) Assessment (bed, cushion, seat, etc.) ?INTERVENTIONS - Miscellaneous ?'[]'$  - External ear exam 0 ?'[]'$  - 0 ?Patient Transfer (multiple staff / Civil Service fast streamer / Similar devices) ?'[]'$  - 0 ?Simple Staple / Suture removal (25 or less) ?'[]'$  - 0 ?Complex Staple / Suture removal (26 or more) ?'[]'$  - 0 ?Hypo/Hyperglycemic Management (do not check if billed  separately) ?'[]'$  - 0 ?Ankle / Brachial Index (ABI) - do not check if billed separately ?Has the patient been seen at the hospital within the last  three years: Yes ?Total Score: 0 ?Level Of Care: ____ ?[place], [name] ?Electronic Signature(s) ?Signed: 09/21/2021 10:35:39 AM By: [name] RN ?Entered By: [name] on 09/20/2021 15:51:23 ?[place], [name] ?-------------------------------------------------------------------------------- ?Compression Therapy Details ?Patient Name: [name], [name] ?Date of Service: 09/20/2021 2:00 PM ?Medical Record Number: [id number] ?Patient Account Number: [id number] ?Date of Birth/Sex: 11-07-1950 (71 y.o. Male) ?Treating RN: [name] ?Primary Care Kalena Mander: [place] No Other Clinician: ?Referring Amariana Mirando: Eulogio Ditch ?Treating Paetyn Pietrzak/Extender: Jeri Cos ?Weeks in Treatment: 33 ?Compression Therapy Performed for Wound Assessment: NonWound Condition Lymphedema - Left Leg ?Performed By: Clinician [name], RN ?Compression Type: Four Layer ?Post Procedure Diagnosis ?Same as Pre-procedure ?Electronic Signature(s) ?Signed: 09/21/2021 10:35:39 AM By: [name] RN ?Entered By: [name] on 09/20/2021 15:03:51 ?[place], [name] ?-------------------------------------------------------------------------------- ?Compression Therapy Details ?Patient Name: [name], [name] ?Date of Service: 09/20/2021 2:00 PM ?Medical Record Number: [id number] ?Patient Account Number: [id number] ?Date of Birth/Sex: [birth year] (71 y.o. Male) ?Treating RN: [name] ?Primary Care Cariana Karge: [place] No Other Clinician: ?Referring Garyn Waguespack: Eulogio Ditch ?Treating Valdez Brannan/Extender: Jeri Cos ?Weeks in Treatment: 33 ?Compression Therapy Performed for Wound Assessment: NonWound Condition Lymphedema - Right Leg ?Performed By: Clinician [name], RN ?Compression Type: Four Layer ?Post Procedure Diagnosis ?Same as Pre-procedure ?Electronic  Signature(s) ?Signed: 09/21/2021 10:35:39 AM By: [name] RN ?Entered By: [name] on 09/20/2021 15:04:17 ?[place], [name] ([id number]) ?-------------------------------------------------------------------------------- ?Encounter Discharge Information Details ?Patient Name: [name], [name] ?Date of Service: 09/20/2021 2:00 PM ?Medical Record Number: [id number] ?Patient Account Number: [id number] ?Date of Birth/Sex: 05-Sep-1950 (71 y.o. Male) ?Treating RN: [name] ?Primary Care [name] [name]: [place] No Other Clinician: ?Referring Olvin Rohr: Eulogio Ditch ?Treating Divya Munshi/Extender: Jeri Cos ?Weeks in Treatment: 33 ?Encounter Discharge Information Items ?Discharge Condition: Stable ?Ambulatory Status: [name] ?Discharge Destination: Home ?Transportation: Private Auto ?Accompanied By: self ?Schedule Follow-up Appointment: Yes ?Clinical Summary of Care: Patient Declined ?Electronic Signature(s) ?Signed: 09/20/2021 3:52:30 PM By: [name] RN ?Entered By: [name] on 09/20/2021 15:52:30 ?[place], [name] ([id number]) ?-------------------------------------------------------------------------------- ?Lower Extremity Assessment Details ?Patient Name: [name], [name] ?Date of Service: 09/20/2021 2:00 PM ?Medical Record Number: [id number] ?Patient Account Number: [id number] ?Date of Birth/Sex: [birth year] (71 y.o. Male) ?Treating RN: [name] ?Primary Care Deangelo Berns: [place] No Other Clinician: ?Referring Amenda Duclos: Eulogio Ditch ?Treating Kathleena Freeman/Extender: Jeri Cos ?Weeks in Treatment: 33 ?Edema Assessment ?Assessed: [Left: No] [Right: No] ?Edema: [Left: Yes] [Right: Yes] ?Calf ?Left: Right: ?Point of Measurement: 35 cm From Medial Instep 38 cm 38 cm ?Ankle ?Left: Right: ?Point of Measurement: 10 cm From Medial Instep 28 cm 28 cm ?Vascular Assessment ?Pulses: ?Dorsalis Pedis ?Palpable: [Left:Yes] [Right:Yes] ?Electronic Signature(s) ?Signed: 09/21/2021 10:35:39 AM By: [name] RN ?Entered By: [name] on 09/20/2021 14:35:38 ?[place], [name] ([id number]) ?-------------------------------------------------------------------------------- ?Multi Wound Chart Details ?Patient Name: [name], [name] ?Date of Service: 09/20/2021 2:00 PM ?Med

## 2021-09-27 ENCOUNTER — Encounter: Payer: PPO | Admitting: Physician Assistant

## 2021-09-27 DIAGNOSIS — E11622 Type 2 diabetes mellitus with other skin ulcer: Secondary | ICD-10-CM | POA: Diagnosis not present

## 2021-09-27 NOTE — Progress Notes (Signed)
RODRICK, PAYSON ([id number]) Visit Report for 09/27/2021 Arrival Information Details Patient Name: [name], [name] Date of Service: 09/27/2021 2:00 PM Medical Record Number: [id number] Patient Account Number: [id number] Date of Birth/Sex: 17-Jun-1950 (71 y.o. M) Treating RN: [name] Primary Care Kaylana Fenstermacher: [place] No Other Clinician: Referring Tyrus Wilms: Felipa Eth Treating Wirt Hemmerich/Extender: Skipper Cliche in Treatment: 56 Visit Information History Since Last Visit All ordered tests and consults were completed: No Patient Arrived: [name] Added or deleted any medications: No Arrival Time: 14:01 Any new allergies or adverse reactions: No Accompanied By: self Had a fall or experienced change in No Transfer Assistance: None activities of daily living that may affect Patient Identification Verified: Yes risk of falls: Secondary Verification Process Completed: Yes Signs or symptoms of abuse/neglect since last visito No Patient Requires Transmission-Based No Hospitalized since last visit: No Precautions: Implantable device outside of the clinic excluding No Patient Has Alerts: Yes cellular tissue based products placed in the center Patient Alerts: AVVS consult on file since last visit: Last ABI-R 1.09; L Has Dressing in Place as Prescribed: Yes 1.04 Has Compression in Place as Prescribed: Yes Pain Present Now: No Electronic Signature(s) Signed: 09/27/2021 4:02:04 PM By: [name] RN Entered By: [name] on 09/27/2021 14:05:17 [name] ([id number]) -------------------------------------------------------------------------------- Clinic Level of Care Assessment Details Patient Name: [name] Date of Service: 09/27/2021 2:00 PM Medical Record Number: [id number] Patient Account Number: [id number] Date of Birth/Sex: [birth year] (71 y.o. M) Treating RN: [name] Primary Care Kaden Daughdrill: [place] No Other Clinician: Referring Barclay Lennox: Felipa Eth Treating Shun Pletz/Extender: Skipper Cliche in Treatment: 34 Clinic Level of Care Assessment Items TOOL 1 Quantity Score '[]'$  - Use when EandM and Procedure is performed on INITIAL visit 0 ASSESSMENTS - Nursing Assessment / Reassessment '[]'$  - General Physical Exam (combine w/ comprehensive assessment (listed just below) when performed on new 0 pt. evals) '[]'$  - 0 Comprehensive Assessment (HX, ROS, Risk Assessments, Wounds Hx, etc.) ASSESSMENTS - Wound and Skin Assessment / Reassessment '[]'$  - Dermatologic / Skin Assessment (not related to wound area) 0 ASSESSMENTS - Ostomy and/or Continence Assessment and Care '[]'$  - Incontinence Assessment and Management 0 '[]'$  - 0 Ostomy Care Assessment and Management (repouching, etc.) PROCESS - Coordination of Care '[]'$  - Simple Patient / Family Education for ongoing care 0 '[]'$  - 0 Complex (extensive) Patient / Family Education for ongoing care '[]'$  - 0 Staff obtains Programmer, systems, Records, Test Results / Process Orders '[]'$  - 0 Staff telephones HHA, Nursing Homes / Clarify orders / etc '[]'$  - 0 Routine Transfer to another Facility (non-emergent condition) '[]'$  - 0 Routine Hospital Admission (non-emergent condition) '[]'$  - 0 New Admissions / Biomedical engineer / Ordering NPWT, Apligraf, etc. '[]'$  - 0 Emergency Hospital Admission (emergent condition) PROCESS - Special Needs '[]'$  - Pediatric / Minor Patient Management 0 '[]'$  - 0 Isolation Patient Management '[]'$  - 0 Hearing / Language / Visual special needs '[]'$  - 0 Assessment of Community assistance (transportation, D/C planning, etc.) '[]'$  - 0 Additional assistance / Altered mentation '[]'$  - 0 Support Surface(s) Assessment (bed, cushion, seat, etc.) INTERVENTIONS - Miscellaneous '[]'$  - External ear exam 0 '[]'$  - 0 Patient Transfer (multiple staff / Civil Service fast streamer / Similar devices) '[]'$  - 0 Simple Staple / Suture removal (25 or less) '[]'$  - 0 Complex Staple / Suture removal (26 or more) '[]'$  - 0 Hypo/Hyperglycemic  Management (do not check if billed separately) '[]'$  - 0 Ankle / Brachial Index (ABI) - do not check if billed separately Has the patient been  seen at the hospital within the last three years: Yes Total Score: 0 Level Of Care: ____ [name] ([id number]) Electronic Signature(s) Signed: 09/27/2021 4:02:04 PM By: [name] RN Entered By: [name] on 09/27/2021 14:24:02 [name] ([id number]) -------------------------------------------------------------------------------- Compression Therapy Details Patient Name: [name] Date of Service: 09/27/2021 2:00 PM Medical Record Number: [id number] Patient Account Number: [id number] Date of Birth/Sex: [birth year] (71 y.o. M) Treating RN: [name] Primary Care Chilton Sallade: [place] No Other Clinician: Referring Abrey Bradway: Felipa Eth Treating Jonessa Triplett/Extender: Skipper Cliche in Treatment: 34 Compression Therapy Performed for Wound Assessment: NonWound Condition Lymphedema - Left Leg Performed By: Clinician [name], RN Compression Type: Four Layer Post Procedure Diagnosis Same as Pre-procedure Electronic Signature(s) Signed: 09/27/2021 4:02:04 PM By: [name] RN Entered By: [name] on 09/27/2021 14:23:15 [name] ([id number]) -------------------------------------------------------------------------------- Compression Therapy Details Patient Name: [name] Date of Service: 09/27/2021 2:00 PM Medical Record Number: [id number] Patient Account Number: [id number] Date of Birth/Sex: Aug 07, 1950 (71 y.o. M) Treating RN: [name] Primary Care Tamara Monteith: [place] No Other Clinician: Referring Kenzie Flakes: Felipa Eth Treating Remer Couse/Extender: Skipper Cliche in Treatment: 34 Compression Therapy Performed for Wound Assessment: NonWound Condition Lymphedema - Right Leg Performed By: Clinician [name], RN Compression Type: Four Layer Post Procedure Diagnosis Same as  Pre-procedure Electronic Signature(s) Signed: 09/27/2021 4:02:04 PM By: [name] RN Entered By: [name] on 09/27/2021 14:23:40 [name] ([id number]) -------------------------------------------------------------------------------- Encounter Discharge Information Details Patient Name: [name] Date of Service: 09/27/2021 2:00 PM Medical Record Number: [id number] Patient Account Number: [id number] Date of Birth/Sex: Mar 19, 1951 (71 y.o. M) Treating RN: [name] Primary Care Conlan Miceli: [place] No Other Clinician: Referring Everlyn Farabaugh: Felipa Eth Treating Audra Kagel/Extender: Skipper Cliche in Treatment: 10 Encounter Discharge Information Items Discharge Condition: Stable Ambulatory Status: Cane Discharge Destination: Home Transportation: Private Auto Accompanied By: self Schedule Follow-up Appointment: Yes Clinical Summary of Care: Patient Declined Electronic Signature(s) Signed: 09/27/2021 4:02:04 PM By: [name] RN Entered By: [name] on 09/27/2021 14:25:15 [name] ([id number]) -------------------------------------------------------------------------------- Lower Extremity Assessment Details Patient Name: [name] Date of Service: 09/27/2021 2:00 PM Medical Record Number: [id number] Patient Account Number: [id number] Date of Birth/Sex: 08-03-1950 (71 y.o. M) Treating RN: [name] Primary Care Haliyah Fryman: [place] No Other Clinician: Referring Keiasha Diep: Felipa Eth Treating Naquisha Whitehair/Extender: Skipper Cliche in Treatment: 34 Edema Assessment Assessed: [Left: No] [Right: No] Edema: [Left: Yes] [Right: Yes] Calf Left: Right: Point of Measurement: 35 cm From Medial Instep 37 cm 36 cm Ankle Left: Right: Point of Measurement: 10 cm From Medial Instep 27 cm 24 cm Vascular Assessment Pulses: Dorsalis Pedis Palpable: [Left:Yes] [Right:Yes] Electronic Signature(s) Signed: 09/27/2021 4:02:04 PM By: [name]  RN Entered By: [name] on 09/27/2021 14:18:27 [name] ([id number]) -------------------------------------------------------------------------------- Multi Wound Chart Details Patient Name: [name] Date of Service: 09/27/2021 2:00 PM Medical Record Number: [id number] Patient Account Number: [id number] Date of Birth/Sex: October 20, 1950 (71 y.o. M) Treating RN: [name] Primary Care Giulietta Prokop: [place] No Other Clinician: Referring Joshwa Hemric: Felipa Eth Treating Jovi Alvizo/Extender: Skipper Cliche in Treatment: 34 Vital Signs Height(in): 73 Pulse(bpm): 71 Weight(lbs): 312 Blood Pressure(mmHg): 113/62 Body Mass Index(BMI): 41.2 Temperature(F): 98.5 Respiratory Rate(breaths/min): 18 Wound Assessments Treatment Notes Electronic Signature(s) Signed: 09/27/2021 4:02:04 PM By: [name] RN Entered By: [name] on 09/27/2021 14:18:49 [name] ([id number]) -------------------------------------------------------------------------------- Multi-Disciplinary Care Plan Details Patient Name: [name] Date of Service: 09/27/2021 2:00 PM Medical Record Number: [id number] Patient Account Number: [id number] Date of Birth/Sex: [birth year] (71 y.o. M) Treating RN: [name] Primary Care  Gracie Gupta: [place] No Other Clinician: Referring Danyell Awbrey: Felipa Eth Treating Felicitas Sine/Extender: Skipper Cliche in Treatment: 34 Active Inactive Soft Tissue Infection Nursing Diagnoses: Impaired tissue integrity Knowledge deficit related to disease process and management Knowledge deficit related to home infection control: handwashing, handling of soiled dressings, supply storage Potential for infection: soft tissue Goals: Patient/caregiver will verbalize understanding of or measures to prevent infection and contamination in the home setting Date Initiated: 09/06/2021 Target Resolution Date: 09/06/2021 Goal Status: Active Patient's soft tissue  infection will resolve Date Initiated: 09/06/2021 Target Resolution Date: 09/06/2021 Goal Status: Active Signs and symptoms of infection will be recognized early to allow for prompt treatment Date Initiated: 09/06/2021 Target Resolution Date: 09/06/2021 Goal Status: Active Interventions: Assess signs and symptoms of infection every visit Provide education on infection Treatment Activities: Education provided on Infection : 09/20/2021 Systemic antibiotics : 09/06/2021 Notes: Electronic Signature(s) Signed: 09/27/2021 4:02:04 PM By: [name] RN Entered By: [name] on 09/27/2021 14:18:35 [name] ([id number]) -------------------------------------------------------------------------------- Pain Assessment Details Patient Name: [name] Date of Service: 09/27/2021 2:00 PM Medical Record Number: [id number] Patient Account Number: [id number] Date of Birth/Sex: 1950-08-14 (71 y.o. M) Treating RN: [name] Primary Care Nathan Stallworth: [place] No Other Clinician: Referring Laney Bagshaw: Felipa Eth Treating Yarisbel Miranda/Extender: Skipper Cliche in Treatment: 15 Active Problems Location of Pain Severity and Description of Pain Patient Has Paino No Site Locations Pain Management and Medication Current Pain Management: Electronic Signature(s) Signed: 09/27/2021 4:02:04 PM By: [name] RN Entered By: [name] on 09/27/2021 14:05:47 [name] ([id number]) -------------------------------------------------------------------------------- Patient/Caregiver Education Details Patient Name: [name] Date of Service: 09/27/2021 2:00 PM Medical Record Number: [id number] Patient Account Number: [id number] Date of Birth/Gender: 12-27-1950 (71 y.o. M) Treating RN: [name] Primary Care Physician: [place] No Other Clinician: Referring Physician: Felipa Eth Treating Physician/Extender: Skipper Cliche in Treatment: 42 Education Assessment Education  Provided To: Patient Education Topics Provided Infection: Methods: Explain/Verbal Responses: State content correctly Electronic Signature(s) Signed: 09/27/2021 4:02:04 PM By: [name] RN Entered By: [name] on 09/27/2021 14:24:36 [name] ([id number]) -------------------------------------------------------------------------------- Vitals Details Patient Name: [name] Date of Service: 09/27/2021 2:00 PM Medical Record Number: [id number] Patient Account Number: [id number] Date of Birth/Sex: 1950/06/10 (71 y.o. M) Treating RN: [name] Primary Care Tanijah Morais: [place] No Other Clinician: Referring Liliahna Cudd: Felipa Eth Treating Petronella Shuford/Extender: Skipper Cliche in Treatment: 34 Vital Signs Time Taken: 14:05 Temperature (F): 98.5 Height (in): 73 Pulse (bpm): 71 Weight (lbs): 312 Respiratory Rate (breaths/min): 18 Body Mass Index (BMI): 41.2 Blood Pressure (mmHg): 113/62 Reference Range: 80 - 120 mg / dl Electronic Signature(s) Signed: 09/27/2021 4:02:04 PM By: [name] RN Entered By: [name] on 09/27/2021 14:05:34

## 2021-09-27 NOTE — Progress Notes (Addendum)
ASANI, MCBURNEY ([id number]) Visit Report for 09/27/2021 Chief Complaint Document Details Patient Name: [name], [name] Date of Service: 09/27/2021 2:00 PM Medical Record Number: [id number] Patient Account Number: [id number] Date of Birth/Sex: 10/25/1950 (71 y.o. M) Treating RN: [name] Primary Care Provider: Fontaine No Other Clinician: Referring Provider: Felipa Eth Treating Provider/Extender: Skipper Cliche in Treatment: 98 Information Obtained from: Patient Chief Complaint Left LE ulcers Electronic Signature(s) Signed: 09/27/2021 2:14:11 PM By: Worthy Keeler PA-C Entered By: Worthy Keeler on 09/27/2021 14:14:10 [name] ([id number]) -------------------------------------------------------------------------------- HPI Details Patient Name: [name] Date of Service: 09/27/2021 2:00 PM Medical Record Number: [id number] Patient Account Number: [id number] Date of Birth/Sex: 1951-03-05 (71 y.o. M) Treating RN: [name] Primary Care Provider: Fontaine No Other Clinician: Referring Provider: Felipa Eth Treating Provider/Extender: Skipper Cliche in Treatment: 54 History of Present Illness HPI Description: 71 year old male who presented to the ER with bilateral lower extremity blisters which had started last week. he has a past medical history of leukemia, diabetes mellitus, hypertension, edema of both lower extremities, his recurrent skin infections, peripheral vascular disease, coronary artery disease, congestive heart failure and peripheral neuropathy. in the ER he was given Rocephin and put on Silvadene cream. he was put on oral doxycycline and was asked to follow-up with the [facility]. His last hemoglobin A1c was 6.6 in December and he checks his blood sugar once a week. He does not have any physicians outside the [place] system. He does not recall any vascular duplex studies done either for arterial or venous disease but was told to wear  compression stockings which he does not use 05/30/2016 -- we have not yet received any of his notes from the [facility] hospital system and his arterial and venous duplex studies are scheduled here in [place] around mid February. We are unable to have his insurance accepted by home health agencies and hence he is getting dressings only once a week. 06/06/16 -- -- I received a call from the patient's PCP at the [facility] at [facility] and spoke to Dr. Garvin Fila, phone number [phone] and fax number [phone]. She confirmed that no vascular testing was done over the last 5 years and she would be happy to do them if the patient did want them to be done at the [place] and we could fax him a request. Readmission: 71 year old male seen by as in February of this year and was referred to vein and vascular for studies and opinion from the vascular surgeons. The patient returns today with a fresh problem having had blisters on his left lower extremity which have been there for about 5 days and he clearly states that he has been wearing his compression stockings as advised though he could not read the moderate compression and has been wearing light compression. Review of his electronic medical records note that he had lower extremity arterial duplex examination done on 06/23/2016 which showed no hemodynamically significant stenosis in the bilateral lower extremity arterial system. He also had a lower extremity venous reflux examination done on 07/07/2016 and it was noted that he had venous incompetence in the right great saphenous vein and bilateral common femoral veins. Patient was seen by Dr. Tamala Julian on the same day and for some reason his notes do not reflect the venous studies or the arterial studies and he recommended patient do a venous duplex ultrasound to look for reflux and return to see him.he would also consider a lymph pump if required. The patient was told that his workup  was normal and hence the patient  canceled his follow-up appointment. 02/03/17 on evaluation today patient left medial lower extremity blister appears to be doing about the same. It is still continuing to drain and there's still the blistered skin covering the wound bed which is making it difficult for the alternate to do its job. Fortunately there is no evidence of cellulitis. No fevers chills noted. Patient states in general he is not having any significant discomfort. Patient's lower extremity arterial duplex exam revealed that patient was hemodynamically stable with no evidence of stenosis in regard to the bilateral lower extremities. The lower extremity venous reflux exam revealed the patient had venous incontinence noted in the right greater saphenous and bilateral common femoral vein. There is no evidence of deep or superficial vein thrombosis in the bilateral lower extremities. Readmission: 11/12/18 Patient presents for evaluation our clinic today concerning issues that he is having with his left lower extremity. He tells me that a couple weeks ago he began developing blisters on the left lower extremity along with increased swelling. He typically wears his compression stockings on a regular basis is previously been evaluated both here as well is with vascular surgery they would recommend lymphedema pumps but unfortunately that somehow fell through and he never heard anything back from that. Nonetheless I think lymphedema pumps would be beneficial for this patient. He does have a history of hypertension and diabetes. Obviously the chronic venous stasis and lymphedema as well. At this point the blisters have been given in more trouble he states sometimes when the blisters openings able to clean it down with alcohol and it will dry out and do well. Unfortunately that has not been the case this time. He is having some discomfort although this mean these with cleaning the areas he doesn't have discomfort just on a regular basis. He  has not been able to wear his compression stockings since the blisters arose due to the fact that of course it will drain into the socks causing additional issues and he didn't have any way to wrap this otherwise. He has increased to taking his Lasix every day instead of every other day. He sees his primary care provider later this month as well. No fevers, chills, nausea, or vomiting noted at this time. 11/19/18-Patient returns at 1 week, per intake RN the amount of seepage into the compression wraps was definitely improved, overall all the wounds are measuring smaller but continuing silver alginate to the wounds as primary dressing 11/26/18 on evaluation today patient appears to be doing quite well in regard to his left lower [place] ulcers. In fact of the areas that were noted initially he only has two regions still open. There is no evidence of active infection at this time. He still is not heard anything from the company regarding lymphedema pumps as of yet. Again as previously seen vascular they have not recommended any surgical intervention. 12/03/2018 on evaluation today patient actually appears to be doing quite well with regard to his lower extremity ulcers. In fact most of the areas appear to be healed the one spot which does not seem to be completely healed I am unsure of whether or not this is really draining that much but nonetheless there does not appear to be any signs of infection or significant drainage at this point. There is no sign of fever, chills, nausea, vomiting, or diarrhea. Overall I am pleased with how things have progressed I think is very close to being able to transition  to his home compression stockings. FIRAS, GUARDADO ([id number]) 12/10/2018 upon evaluation today patient appears to be doing quite well with regard to his left lower extremity. He has been tolerating the dressing changes without complication. Fortunately there is no signs of active infection at this time. He  appears after thorough evaluation of his leg to only have 1 small area that remains open at this point everything else appears to be almost completely closed. He still have significant swelling of the left lower extremity. We had discussed discussing this with his primary care provider he is not able to see her in person they were at the [facility] and right now the [place] is not seeing patients on site. According to the patient anyway. Subsequently he did speak with her apparently and his primary care provider feels that he may likely have a DVT. With that being said she has not seen his leg she is just going off of his history. Nonetheless that is a concern that the patient now has as well and while I do not feel the DVT is likely we can definitely ensure that that is not the case I will go ahead and see about putting that order in today. Nonetheless otherwise I am in a recommend that we continue with the current wound care measures including the compression therapy most likely. We just need to ensure that his leg is indeed free of any DVTs. 12/17/2018 on evaluation today patient actually appears to be completely healed today. He does have 2 very small areas of blistering although this is not anything too significant at this point which is good news. With that being said I am in agreement with the fact that I think he is completely healed at this point. He does want to get back into his compression stocking. The good news is we have gotten approval from insurance for his lymphedema pumps we received a letter since last saw him last week. The other good news is his study did come back and showed no evidence of a DVT. 12/20/2018 on evaluation today patient presents for follow-up concerning his ongoing issues with his left lower extremity. He was actually discharged last Friday and did fairly well until he states blisters opened this morning. He tells me he has been wearing his compression stocking although  he has a hard time getting this on. There does not appear to be any signs of active infection at this time. No fevers, chills, nausea, vomiting, or diarrhea. 12/27/2018 on evaluation today patient appears to be doing very well with regard to his swelling of the left lower extremity the 4 layer compression wrap seems to have been beneficial for him. Fortunately there is no signs of active infection at this time. Patient has been tolerating the compression wrap without complication and his foot swelling in particular appears to be greatly improved. He does still have a wound on the lateral portion of his left leg I believe this is more of a blister that has now reopened. 01/03/2019 on evaluation today patient actually appears to be doing excellent in regard to his left lower extremity. He did receive his compression pumps and is actually use this 7 times since he was last here in the office. On top of the compression wrap he is now roughly 3 cm better at the calf and 2 cm better at the ankle he also states that his foot seem to go an issue better without even having to use a shoe horn. Obviously I  think this is all evidence that he is doing excellent in this regard. The other good news is he does not appear to have anything open today as far as wounds are concerned. 01/15/2019 on evaluation today patient appears to be doing more poorly yet again with regard to his left lower extremity. He has developed new wounds again after being discharged just recently. Unfortunately this continues to be the case that he will heal and then have subsequent new wounds. The last time I was hopeful that he may not end up coming back too quickly especially since he states he has been using his lymphedema pumps along with wearing his compression. Nonetheless he had a blister on the back of his leg that popped up on the left and this has opened up into an ulceration it is quite painful. 01/22/19 on evaluation today patient  actually appears to be doing well with regard to his wound on the left lower extremity. He's been tolerating the dressing changes without complication including the compression wrap in the wound appears to be significantly smaller today which is great news. Overall very pleased in this regard. 01/29/2019 on evaluation today patient appears to be doing well with regard to his left posterior lower extremity ulcer. He has been tolerating the dressing changes without complication. This is not completely healed but is getting much closer. We did order a Farrow wrap 4000 for him he has received this and has it with him today although I am not sure we are quite ready to start him on that as of yet. We are very close. 02/05/2019 on evaluation today patient actually appears to be doing quite well with regard to his left posterior lower extremity ulcer. He still has a very tiny opening remaining but the fortunate thing is he seems to be healing quite nicely. He also did get his Farrow wrap which I am hoping will help with his edema control as well at home. Fortunately there is no evidence of active infection. 02/12/2019 patient and fortunately appears to be doing poorly in regard to his wounds of the left lower extremity. He was very close to healing therefore we attempted to use his Velcro compression wraps continuing with lymphedema pumps at home. Unfortunately that does not seem to have done very well for him. He tells me that he wore them all the time but again I am not sure why if that is the case that he is having such significant edema. He is still on his fluid pills as well. With that being said there is no obvious sign of infection although I do wonder about the possibility of infection at this time as well. 02/19/2019 unfortunately upon evaluation today patient appears to be doing more poorly with regard to his left lower extremity. He is not showing signs of significant improvement and I think the  biggest issue here is that he does have an infection that appears to likely be Pseudomonas. That is based on the blue-green drainage that were noted at this time. Unfortunately the antibiotic that has been on is not going to take care of this at all. I think they will get a need to switch him to either Levaquin or Cipro and this was discussed with the patient. 02/26/2019 on evaluation today patient's lower extremity on the left appears to be doing significantly better as compared to last evaluation. Fortunately there is no signs of active infection at this time. He has been tolerating the compression wrap without complication in  fact he made it the whole week at this point. He is showing signs of excellent improvement I am very happy in this regard. With that being said he is having some issues with infection we did review the results of his culture which I noted today. He did have a positive finding for Enterobacter as well as Alcaligenes faecalis. Fortunately the Levaquin that I placed him on will work for both which is great news. There is no signs of systemic infection at this point. 10/30; left posterior leg wound in the setting of very significant edema and what looks like chronic venous inflammation. He has compression pumps but does not use them. We have been using 3 layer compression. Silver alginate to the wound as the primary dressing 03/18/2019 on evaluation today patient appears to be doing a little better compared to last time I saw him. He really has not been using his compression pumps he tells me that he is having too much discomfort. He has been keeping his wraps on however. He is only been taking his fluid pills every other day because he states they are not really helping and he has an appointment with his primary care provider at the [facility] tomorrow. Subsequently the wound itself on the left lower extremity does seem to be greatly improved compared to previous. 03/25/2019 on evaluation  today patient appears to be doing better with regard to his wounds on the bilateral lower extremities. The left is doing excellent the right is also doing better although both still do show some signs of open wounds noted at this point unfortunately. Fortunately there is no signs of active infection at this time. The patient also is not really having any significant pain which is good news. Unfortunately there was some confusion with the referral on vascular disease and as far as getting the patient scheduled there can be contacting him later today to do this MONTOYA, BRANDEL ([id number]) fortunately we got this straightened out. 04/01/2019 on evaluation today patient appears to be doing no fevers, chills, nausea, vomiting, or diarrhea. Excellent at this time with regard to his lower extremities. There does not appear to be any open wound at this point which is good news. Fortunately is also no signs of active infection at this time. Overall feel like the patient has done excellent with the compression the problem is every time we got him to this point and then subsequently go to using his own compression things just go right back to where they were. I am not sure how to address this we can try to get an appointment with vascular for 2 weeks now they have yet to call him. Obviously this has become frustrating for the patient as well. I think the issue has just been an honest error as far as scheduling is concerned but nonetheless still worn out the point where I am unsure of which direction we should take. 04/08/2019 on evaluation today patient actually appears to be doing well with regard to his lower extremities. There are no open wounds at this time and things seem to be managing quite nicely as far as the overall edema control is concerned. With that being said he does have his compression socks today for [place] to go ahead and reinitiate therapy in that manner at this point. He is going to be going for  shoes to be measured on Wednesday and then coincidentally he will also be seeing vascular on Thursday. Overall I think this is good news and  again I am hopeful that they will be able to do something for him to help prevent ongoing issues with edema control as well. No fevers, chills, nausea, vomiting, or diarrhea. 04/11/2019 on evaluation today patient actually appears to be doing poorly after just being discharged on Monday of this week. He had been experiencing issues with again blisters especially on the left lower extremity. With that being said he was completely healed and appeared to be doing great this past Monday. He then subsequently has new blisters that formed before his appointment with vascular this morning. He was also measured for shoes in the interim. With that being said we may have figured out what exactly is going on and why he continues to have issues like what we are seeing at this point. He takes his compression stockings off at nighttime and then he ends up having to sleep in his chair for 5-6 hours a night. He sleeps with his feet down he cannot really get him up in the recliner and therefore he is sleeping and the worst possible his position with his feet on the floor for that majority of the time. Again as I explained to him that is about one third at minimum at least one fourth of his day that he spending with his feet dangling down on the ground and the worst possible position they could be. I think this may be what is causing the issue. Subsequently I am leaning toward thinking that he may need a hospital bed in order to elevate his legs. We likely can have to coordinate this with his primary care provider at the [facility]. Readmission: 01/26/2021 this is a patient who presents for repeat evaluation here in the clinic although it is actually been couple of years since have seen him in fact it was December 2020 when I last saw him. Subsequently he never really healed but  did end up being lost to follow-up. He tells me has been having issues ongoing with his lower extremities has bilateral lower extremity lymphedema no real significant or definitive open wounds but in general his lymphedema is way out of control. We were never able to refer him to lymphedema clinic simply due to the fact to be honest we were never able to get him completely healed. I do not see anyone with open wounds. The patient does have evidence of type 2 diabetes mellitus, lymphedema, chronic venous insufficiency, and hypertension. That really has not changed since his last evaluation. 02/09/2021 upon evaluation today patient appears to be doing a little better in regard to his legs although he still having a tremendous amount of drainage especially on the left leg. Fortunately there does not appear to be any evidence of active infection. Of note when we looked into this further it appears that the patient did not have any absorptive dressing on it was just the 4-layer compression wrap. Nonetheless this is probably big part of the issue here. 10/10; he comes in today with 3 large areas on the upper right lower leg likely remanence of denuded blistering under his compression wraps. He has no other wounds on the right. On the left he has the denuded area on the left medial foot and ankle and on the left dorsal foot. Massive lymphedema in both feet dorsally. Using Zetuvit under compression We have increased home health visitation to twice a week to change the dressings and will change it once 02/22/2021 upon evaluation today patient appears to be doing well currently  with regard to his wounds. He has been tolerating the dressing changes without complication. Fortunately there does not appear to be any evidence of active infection which is great news. No fevers, chills, nausea, vomiting, or diarrhea. The biggest issue I see currently is that home health is not putting any medicine on the actual wounds  before wrapping. 03/01/2021 upon evaluation today the patient's right leg actually appears to be doing quite well which is great news there does not appear to be any evidence of active infection at this time. No fevers, chills, nausea, vomiting, or diarrhea. With that being said the patient is having issues on the left foot where he is having significant drainage is also an ammonia smell he does not have any animals at home and this makes me concerned about a bacteria producing urea as a byproduct. Again the possible common organisms will be E. coli, Proteus, and Enterococcus. All 3 of which can be successfully treated with Levaquin. For that reason I think that this may be a good option for [place] to consider placing him on and I did obtain a culture as well for confirmation sake. 03/08/2021 upon evaluation today patient appears to be doing unfortunately still somewhat poorly in regard to his leg ulcerations. He actually has an area on the right leg where he blistered due to the fact that his wrap slid down and caused an area of pinching on his skin and this has led to a significant issue here. 03/15/2021 upon evaluation today patient unfortunately has not been wrapped appropriately with absorptive dressings nor with the appropriate technique for the third layer of the 4-layer compression wrap. These are issues that we continue to try to address with the home health nurse. Also the absorptive dressing that she had was cut in half and therefore that causes things to leak out it does not actually trap the fluid in regard to the top of the foot overall I think that all these combined are really not seeing things improved significantly here. Fortunately there does not appear to be any signs of significant infection at this time which is good news. He still is having a tremendous amount of drainage. 03/22/2021 upon evaluation today patient appears to be draining tremendously. He still continues to tell me  that he is using his pumps 2 times a day and that coupled with that tells me that he is elevating his legs as well. With that being said all things considered I am really just not seeing the improvement we would expect to see with the 4-layer compression wrap and all the above noted. He in fact had an extremely large Zetuvit dressing on both legs and that they were extremely filled to the max with fluid. This is after just being changed just before the weekend and this is Monday. Nonetheless I am concerned about the fact that there is something going on fairly significant that we cannot get any of this under control and that he is draining this significantly. He supposed be having an echocardiogram it sounds like scheduling has been an issue for him as far as getting in sooner. Its something to do with needing his cousin to drive him because of where it sat and he cannot drive himself to this appointment either way I really think he needs to try to see what he can do about making this happen a little sooner. He tells me he will call today. ANDRES, BANTZ ([id number]) 03/29/2021 on evaluation today patient appears to be  doing about the same in regard to his legs. He did get his cardiology appointment moved up to 6 December which is at least good that is better than what it was before mid December. Overall very pleased in that regard. 04/05/2021 upon evaluation today patient unfortunately is still doing fairly poorly. There does not appear to be any signs of active infection at this time. No fevers, chills, nausea, vomiting, or diarrhea. Unfortunately I think until his edema is under control and overall fluid overload there is really not to be much chance that I can do much to get him better. This is quite unfortunate and frustrating both for myself and the patient to be perfectly honest. Nonetheless I think that he really needs to have a conversation both with his primary care provider as well as  cardiologist he sees the PCP on Monday and cardiology on Wednesday of next week. 04/13/2021 upon evaluation today patient appears to be doing poorly in regard to his bilateral lower extremities his left is still worse than the right. With that being said he has a tremendous amount of drainage he did see his primary care provider yesterday there really was not much there to be done from their perspective. He sees cardiology tomorrow. Nonetheless my biggest concern here is simply that if we do not get the edema under control he is going to continue to have drainage and honestly I think at some point he is going to become infected severely that is my main concern. 04/19/2021 upon evaluation today patient appears to be doing poorly still in regard to his legs. Unfortunately there does not appear to be any signs of infection at this point. He does have a tremendous amount of drainage however. We have not seen the results back from the cardiologist and the echocardiogram that was done. It appears that the patient checked out okay as far as that is concerned with regard to ejection fraction though we still have some issues here to be honest with his diastolic function. I am unsure if this is accounting for everything that we are seeing or not. Either way he has a tremendous amount of drainage from his legs that we are just not able to control in the outpatient setting at this point. I have reached out to Dr. Rockey Situ his cardiologist to see once he reviews the sheet if there is anything that he feels like can be done from an outpatient perspective if not then I think the way to go is probably can to be through inpatient admission and diuresis. Otherwise I am not sure how working to get this under control we tried antibiotics, compression wrapping, and I have told the patient to be elevating his legs I am not sure how much he does of this but either way I think that this is still an ongoing issue  nonetheless. 04/26/2021 upon evaluation today patient appears to be doing poorly in regard to his legs. He is having a tremendous amount of fluid at this point which is quite unfortunate. Its to the point that he may have had at least 5 to 10 pounds of fluid in his dressings this morning when they were removed these were changed this Friday. Subsequently I think he needs to go to the ER for further evaluation and treatment I think is probably can need diuresis possibly even IV antibiotics been on what the blood work looks like but in general I feel like he needs something to get this under control from  an outpatient perspective absent of everywhere I can think of and I cannot get this under control with our traditional measures. I think this is going require more so that we can get him better 12/30; this is a patient with severe bilateral lymphedema. He was hospitalized from 04/26/2021 through 04/29/2021 treated for cellulitis in the setting of lower extremity ulcers and lymphedema. After he left the hospital he is apparently seen for nurse visit our staff contacted cardiology and he has been started on Lasix 40 mg. Apparently his legs have less edema. Lab work from 05/04/2021 showed a BUN of 38 and creatinine of 1.59 these are elevated versus previous where his creatinine seems to have been 1.30 on 12/19 his potassium is 4.3. I believe the lab work is being followed by cardiology We have him in a 4-layer wrap. Xeroform on the leg wounds and sit to fit on the Berry damage skin on the left dorsal foot versus right dorsal foot. He has compression pumps but does not use them. We have apparently not yet ordered him compression stockings 05/17/2021 upon evaluation today patient's legs though better than last time I personally saw him appear to be getting worse compared to where they were previous. Dr. Quentin Cornwall was actually last 1 to see you I have not seen him since 19 December. That was before he went into  the ER. Coming out apparently his legs looked also and they still look better but not as good as they were in the past. 1/16; patient with severe bilateral lymphedema. Severe scaled hyperkeratotic skin on the dorsal aspect of his distal left foot and left medial ankle.. On the right side changes are not as bad. He did not have any weeping edema. Our intake nurse was convinced that he is being compliant with compression pumps 1 hour twice a day 05/31/2021 upon evaluation today patient actually appears to be doing a little bit better in my opinion in regard to his feet. I do not see as much drainage and it being just completely wet as it was previous. Fortunately I do not also see any signs of active infection which is great news as well. 06/07/21 Upon inspection patient's wound bed actually showed signs of doing well he is not nearly as weepy and wet as he has been in the past and overall very pleased in that regard. Fortunately I do not see any signs of active infection locally or nor systemically at this time. Which is great news. No fevers, chills, nausea, vomiting, or diarrhea. 06/14/2021 upon evaluation today patient appears to be doing well with regard to his right foot I am pleased in that regard. His left foot is still draining quite a bit despite using lymphedema pumps, 4-layer compression wraps, and he tells me elevating his legs as well. He also has Lasix that he takes twice a day. Nonetheless I believe that this is still good to be an ongoing issue. We have a hard time getting this under control as far as the swelling is concerned. 06/21/2021 upon evaluation today patient appears to be doing decently well in regard to his wounds all things considered. He still has a tremendous amount of drainage and fluid noted at this point. Fortunately I do not see any signs of active infection locally or systemically at this point which is great news. Nonetheless I am unsure where to go and how to do this  as far as trying to limit his swelling and weeping from his toes in particular.  06/28/2021 upon evaluation patient unfortunately continues to have significant drainage from his feet. We have been keeping him in a compression wrap and despite this he still continues to have extreme fluid issues he seen his cardiologist he is seeing the nephrologist. We really cannot find any way to get this under control when he did well was when he was in the hospital and they got some of the fluid away. But outside of that we are just struggling to achieve the long-term goal of getting this under control and keeping it under control unfortunately. 07/05/2021 upon evaluation today patient appears to be doing poorly in regard to his feet. Unfortunately this continues to be a significant issue and to be honest I am really not certain what to do about it. I referred him to Dr. Randol Kern at [facility] and he does have an appointment although it is 12 April. He also sees his primary care provider on 6 April. He did not want to see Dr. Haynes Kerns until after he saw his PCP that is the reason the appointment so far out. There is really not much I can do in that regard. Nonetheless I do think that we are still continue to have significant lymphedema issues with significant mount of weeping in regard to the feet and again this has just become extremely difficult to manage to be honest I am not sure if there is something else that Dr. Haynes Kerns or someone else could recommend he also will be seeing Dr. Dellia Nims in 2 weeks when I am on vacation and at that time I will see if Dr. Dellia Nims has any ideas about where to go from here in the meantime. TARRON, KROLAK ([id number]) 07/12/2021 upon evaluation today patient appears to be doing about as well as can be expected with regard to his feet. He does actually see his kidney doctor this Friday. He also will be seeing his primary care provider on April 4 and then following that around mid April he will be  seeing Dr. Haynes Kerns at [facility] which was a referral made for him. Again my goal is to try to find out some way to fix this and to be perfectly honest we have had some issues with making any good adjustments. When he was in the hospital and greater amounts of Lasix he was able to get this down and it looked much better upon discharge. With that being said right now things just are not doing nearly as good as what they used to be. 3/13; patient presents for follow-up. He has been using his lymphedema pumps over the past week. He reports an increase in his Lasix dose. He has no issues or complaints today. He denies signs of infection. 07/26/2021 upon evaluation today patient appears to be doing better in regard to his feet bilaterally. Both are showing signs of much less drainage which is great news and overall very pleased in that regard. Fortunately there does not appear to be any evidence of active infection locally or systemically at this time. No fevers, chills, nausea, vomiting, or diarrhea. 08/02/2021 upon evaluation today patient appears to be doing well with regard to his feet. Both are showing signs of being drier the right pretty much has not really draining much at all which is great news. The left is not draining anywhere close to his much as it was during the last evaluation. This is excellent news and overall very pleased. 08/09/2021 upon evaluation today patient appears to be doing well with regard to  his legs the right leg especially showing signs of excellent improvement which is great news I do not see any evidence of active infection locally or systemically which is great. In regard to the left leg he still has some weeping and drainage but nothing as significant as what it was in the past this is great news. 08-16-2021 upon evaluation today patient appears to actually be doing quite well in my opinion in regard to his feet. This is significantly improved compared to what we previously seen  and overall I am extremely pleased in that regard. I do believe that He is actually improving although this is obviously very slow going. 08-23-2021 upon evaluation today patient appears to be doing well currently in regard to his right leg which actually is pretty dry at this point today. Fortunately I do not see any evidence of active infection at this time which is great news. No fevers, chills, nausea, vomiting, or diarrhea. 08-30-2021 upon evaluation today patient appears to be doing well with regard to his lower extremities. The right foot is pretty much completely dry which is great news the left foot though not completely dry seems to be doing decently well. I do not see any signs of active infection locally or systemically which is great news. No fevers, chills, nausea, vomiting, or diarrhea. 09-06-2021 upon evaluation today patient appears to be doing well with regard to his feet in fact now the left foot is almost completely dry as well and I am definitely seeing a lot of significant improvement. With that being said unfortunately he actually appears to have some cellulitis of his right thigh. His toes are also little bit red but this may just be due to the increased swelling. He really is not warm to touch in regard to the toes. 5/8; excellent edema control on the right foot and lower leg there is no open wounds but we continue to put compression on this otherwise this will breakdown. He is using his compression pumps twice a day at home The area that is problematic is on the left dorsal foot some areas that are not fully epithelialized with very dry fissured skin over this area. 09-20-2021 upon evaluation today patient appears to be doing a little bit worse in regard to swelling at this time. Fortunately I do not see any signs of infection with that being said the wrap was not on quite as well as what I would like to have seen. I do believe that this has caused a little bit of excess swelling  and again we need to try to get this under good control. 09-27-2021 upon evaluation today patient appears to be doing awesome in regard to his feet and legs. Everything is measuring smaller the swelling is down and to be perfectly honest I am extremely pleased with the end of his feet especially on the left side and how dry this is today. I do think we are on the right track here. Electronic Signature(s) Signed: 09/27/2021 2:24:59 PM By: Worthy Keeler PA-C Entered By: Worthy Keeler on 09/27/2021 14:24:59 [name] ([id number]) -------------------------------------------------------------------------------- Physical Exam Details Patient Name: [name] Date of Service: 09/27/2021 2:00 PM Medical Record Number: [id number] Patient Account Number: [id number] Date of Birth/Sex: 16-May-1950 (71 y.o. M) Treating RN: [name] Primary Care Provider: Fontaine No Other Clinician: Referring Provider: Felipa Eth Treating Provider/Extender: Skipper Cliche in Treatment: 4 Constitutional Obese and well-hydrated in no acute distress. Respiratory normal breathing without difficulty. Psychiatric this patient  is able to make decisions and demonstrates good insight into disease process. Alert and Oriented x 3. pleasant and cooperative. Notes Upon inspection patient's wound bed actually showed signs at both locations of doing quite well there is actually nothing open at this point and even the swelling is significantly better. There does not appear to be any signs of infection and really no significant drainage. Electronic Signature(s) Signed: 09/27/2021 2:25:16 PM By: Worthy Keeler PA-C Entered By: Worthy Keeler on 09/27/2021 14:25:16 [name] ([id number]) -------------------------------------------------------------------------------- Physician Orders Details Patient Name: [name] Date of Service: 09/27/2021 2:00 PM Medical Record Number: [id number] Patient  Account Number: [id number] Date of Birth/Sex: [birth year] (71 y.o. M) Treating RN: [name] Primary Care Provider: Fontaine No Other Clinician: Referring Provider: Felipa Eth Treating Provider/Extender: Skipper Cliche in Treatment: 11 Verbal / Phone Orders: No Diagnosis Coding ICD-10 Coding Code Description E11.622 Type 2 diabetes mellitus with other skin ulcer I89.0 Lymphedema, not elsewhere classified I87.2 Venous insufficiency (chronic) (peripheral) L97.822 Non-pressure chronic ulcer of other part of left lower leg with fat layer exposed L97.512 Non-pressure chronic ulcer of other part of right foot with fat layer exposed I10 Essential (primary) hypertension L97.811 Non-pressure chronic ulcer of other part of right lower leg limited to breakdown of skin Follow-up Appointments o Return Appointment in 1 week. o Nurse Visit as needed o Other: [facility] for wound care. May utilize formulary equivalent dressing for wound treatment orders unless otherwise specified. Home Health Nurse may visit PRN to address patientos wound care needs. - frequency 3 times per week - patient will be seen once at wound center - home health to see patient 2 times per week o Scheduled days for dressing changes to be completed; exception, patient has scheduled wound care visit that day. o **Please direct any NON-WOUND related issues/requests for orders to patient's Primary Care Physician. **If current dressing causes regression in wound condition, may D/C ordered dressing product/s and apply Normal Saline Moist Dressing daily until next [place] or Other MD appointment. **Notify Wound Healing Center of regression in wound condition at [phone]. Bathing/ Shower/ Hygiene o May shower with wound dressing protected with water repellent cover or cast protector. o No tub bath. Edema Control - Lymphedema  / Segmental Compressive Device / Other Bilateral Lower Extremities o Optional: One layer of unna paste to top of compression wrap (to act as an anchor). - Unna paste on calf to secure wrap in place as needed o 4 Layer Compression System Lymphedema. - ****bilateral legs-total of 3 x per week bi lat, nystatin powder between toes on bilateral toes, 2x2 gauze between toes only needed on left side, apply zetuvits/ or formulary for extra absorbant dressing around ankles and foot area, use ABD to cover toes; 2 times per week with HH and 1 time at wound center [place], LIGHT TAN SPIRAL. WHITE WITH YELLOW LINE FIGURE 8 , COBAN SPIRAL o Tubigrip double layer applied - bi lat feet ankle areas o Elevate, Exercise Daily and Avoid Standing for Long Periods of Time. o Elevate legs to the level of the heart and pump ankles as often as possible o Elevate leg(s) parallel to the floor when sitting. o Compression Pump: Use compression pump on left lower extremity for 60 minutes, twice daily. - 2 times per day o DO YOUR BEST to sleep in the bed at night. DO NOT sleep in your recliner. Long hours of  sitting in a recliner leads to swelling of the legs and/or potential wounds on your backside. o Other: - Contact prescriber regarding use of diuretics to reduce fluid overload. Off-Loading o Turn and reposition every 2 hours Additional Orders / Instructions o Follow Nutritious Diet and Increase Protein Intake Medications-Please add to medication list. o P.O. Antibiotics - Start Doxycycline for right upper leg cellulitis. ASHDEN, SONNENBERG ([id number]) Electronic Signature(s) Signed: 09/27/2021 4:02:04 PM By: [name] RN Signed: 09/27/2021 4:26:20 PM By: Worthy Keeler PA-C Entered By: [name] on 09/27/2021 14:23:52 CAELEB, BATALLA ([id number]) -------------------------------------------------------------------------------- Problem List Details Patient Name: [name] Date  of Service: 09/27/2021 2:00 PM Medical Record Number: [id number] Patient Account Number: [id number] Date of Birth/Sex: 22-Aug-1950 (71 y.o. M) Treating RN: [name] Primary Care Provider: Fontaine No Other Clinician: Referring Provider: Felipa Eth Treating Provider/Extender: Skipper Cliche in Treatment: 74 Active Problems ICD-10 Encounter Code Description Active Date MDM Diagnosis E11.622 Type 2 diabetes mellitus with other skin ulcer 01/26/2021 No Yes I89.0 Lymphedema, not elsewhere classified 01/26/2021 No Yes I87.2 Venous insufficiency (chronic) (peripheral) 01/26/2021 No Yes L97.822 Non-pressure chronic ulcer of other part of left lower leg with fat layer 01/26/2021 No Yes exposed L97.512 Non-pressure chronic ulcer of other part of right foot with fat layer 01/26/2021 No Yes exposed [place] Post (primary) hypertension 01/26/2021 No Yes L97.811 Non-pressure chronic ulcer of other part of right lower leg limited to 02/15/2021 No Yes breakdown of skin Inactive Problems Resolved Problems Electronic Signature(s) Signed: 09/27/2021 2:14:06 PM By: Worthy Keeler PA-C Entered By: Worthy Keeler on 09/27/2021 14:14:06 [name] ([id number]) -------------------------------------------------------------------------------- Progress Note Details Patient Name: [name] Date of Service: 09/27/2021 2:00 PM Medical Record Number: [id number] Patient Account Number: [id number] Date of Birth/Sex: Sep 07, 1950 (71 y.o. M) Treating RN: [name] Primary Care Provider: Fontaine No Other Clinician: Referring Provider: Felipa Eth Treating Provider/Extender: Skipper Cliche in Treatment: 59 Subjective Chief Complaint Information obtained from Patient Left LE ulcers History of Present Illness (HPI) 71 year old male who presented to the ER with bilateral lower extremity blisters which had started last week. he has a past medical history of leukemia, diabetes  mellitus, hypertension, edema of both lower extremities, his recurrent skin infections, peripheral vascular disease, coronary artery disease, congestive heart failure and peripheral neuropathy. in the ER he was given Rocephin and put on Silvadene cream. he was put on oral doxycycline and was asked to follow-up with the [facility]. His last hemoglobin A1c was 6.6 in December and he checks his blood sugar once a week. He does not have any physicians outside the [place] system. He does not recall any vascular duplex studies done either for arterial or venous disease but was told to wear compression stockings which he does not use 05/30/2016 -- we have not yet received any of his notes from the [facility] hospital system and his arterial and venous duplex studies are scheduled here in [place] around mid February. We are unable to have his insurance accepted by home health agencies and hence he is getting dressings only once a week. 06/06/16 -- -- I received a call from the patient's PCP at the [facility] and spoke to Dr. Garvin Fila, phone number [phone] and fax number [phone]. She confirmed that no vascular testing was done over the last 5 years and she would be happy to do them if the patient did want them to be done at the [place] and we could fax him a request. Readmission: 71 year old male seen by  as in February of this year and was referred to vein and vascular for studies and opinion from the vascular surgeons. The patient returns today with a fresh problem having had blisters on his left lower extremity which have been there for about 5 days and he clearly states that he has been wearing his compression stockings as advised though he could not read the moderate compression and has been wearing light compression. Review of his electronic medical records note that he had lower extremity arterial duplex examination done on 06/23/2016 which showed no hemodynamically significant stenosis in the  bilateral lower extremity arterial system. He also had a lower extremity venous reflux examination done on 07/07/2016 and it was noted that he had venous incompetence in the right great saphenous vein and bilateral common femoral veins. Patient was seen by Dr. Tamala Julian on the same day and for some reason his notes do not reflect the venous studies or the arterial studies and he recommended patient do a venous duplex ultrasound to look for reflux and return to see him.he would also consider a lymph pump if required. The patient was told that his workup was normal and hence the patient canceled his follow-up appointment. 02/03/17 on evaluation today patient left medial lower extremity blister appears to be doing about the same. It is still continuing to drain and there's still the blistered skin covering the wound bed which is making it difficult for the alternate to do its job. Fortunately there is no evidence of cellulitis. No fevers chills noted. Patient states in general he is not having any significant discomfort. Patient's lower extremity arterial duplex exam revealed that patient was hemodynamically stable with no evidence of stenosis in regard to the bilateral lower extremities. The lower extremity venous reflux exam revealed the patient had venous incontinence noted in the right greater saphenous and bilateral common femoral vein. There is no evidence of deep or superficial vein thrombosis in the bilateral lower extremities. Readmission: 11/12/18 Patient presents for evaluation our clinic today concerning issues that he is having with his left lower extremity. He tells me that a couple weeks ago he began developing blisters on the left lower extremity along with increased swelling. He typically wears his compression stockings on a regular basis is previously been evaluated both here as well is with vascular surgery they would recommend lymphedema pumps but unfortunately that somehow fell through  and he never heard anything back from that. Nonetheless I think lymphedema pumps would be beneficial for this patient. He does have a history of hypertension and diabetes. Obviously the chronic venous stasis and lymphedema as well. At this point the blisters have been given in more trouble he states sometimes when the blisters openings able to clean it down with alcohol and it will dry out and do well. Unfortunately that has not been the case this time. He is having some discomfort although this mean these with cleaning the areas he doesn't have discomfort just on a regular basis. He has not been able to wear his compression stockings since the blisters arose due to the fact that of course it will drain into the socks causing additional issues and he didn't have any way to wrap this otherwise. He has increased to taking his Lasix every day instead of every other day. He sees his primary care provider later this month as well. No fevers, chills, nausea, or vomiting noted at this time. 11/19/18-Patient returns at 1 week, per intake RN the amount of seepage into the  compression wraps was definitely improved, overall all the wounds are measuring smaller but continuing silver alginate to the wounds as primary dressing 11/26/18 on evaluation today patient appears to be doing quite well in regard to his left lower [place] ulcers. In fact of the areas that were noted initially he only has two regions still open. There is no evidence of active infection at this time. He still is not heard anything from the company regarding lymphedema pumps as of yet. Again as previously seen vascular they have not recommended any surgical intervention. KENTRAVIOUS, LIPFORD ([id number]) 12/03/2018 on evaluation today patient actually appears to be doing quite well with regard to his lower extremity ulcers. In fact most of the areas appear to be healed the one spot which does not seem to be completely healed I am unsure of whether or not  this is really draining that much but nonetheless there does not appear to be any signs of infection or significant drainage at this point. There is no sign of fever, chills, nausea, vomiting, or diarrhea. Overall I am pleased with how things have progressed I think is very close to being able to transition to his home compression stockings. 12/10/2018 upon evaluation today patient appears to be doing quite well with regard to his left lower extremity. He has been tolerating the dressing changes without complication. Fortunately there is no signs of active infection at this time. He appears after thorough evaluation of his leg to only have 1 small area that remains open at this point everything else appears to be almost completely closed. He still have significant swelling of the left lower extremity. We had discussed discussing this with his primary care provider he is not able to see her in person they were at the [facility] and right now the [place] is not seeing patients on site. According to the patient anyway. Subsequently he did speak with her apparently and his primary care provider feels that he may likely have a DVT. With that being said she has not seen his leg she is just going off of his history. Nonetheless that is a concern that the patient now has as well and while I do not feel the DVT is likely we can definitely ensure that that is not the case I will go ahead and see about putting that order in today. Nonetheless otherwise I am in a recommend that we continue with the current wound care measures including the compression therapy most likely. We just need to ensure that his leg is indeed free of any DVTs. 12/17/2018 on evaluation today patient actually appears to be completely healed today. He does have 2 very small areas of blistering although this is not anything too significant at this point which is good news. With that being said I am in agreement with the fact that I think he is  completely healed at this point. He does want to get back into his compression stocking. The good news is we have gotten approval from insurance for his lymphedema pumps we received a letter since last saw him last week. The other good news is his study did come back and showed no evidence of a DVT. 12/20/2018 on evaluation today patient presents for follow-up concerning his ongoing issues with his left lower extremity. He was actually discharged last Friday and did fairly well until he states blisters opened this morning. He tells me he has been wearing his compression stocking although he has a hard time getting this on.  There does not appear to be any signs of active infection at this time. No fevers, chills, nausea, vomiting, or diarrhea. 12/27/2018 on evaluation today patient appears to be doing very well with regard to his swelling of the left lower extremity the 4 layer compression wrap seems to have been beneficial for him. Fortunately there is no signs of active infection at this time. Patient has been tolerating the compression wrap without complication and his foot swelling in particular appears to be greatly improved. He does still have a wound on the lateral portion of his left leg I believe this is more of a blister that has now reopened. 01/03/2019 on evaluation today patient actually appears to be doing excellent in regard to his left lower extremity. He did receive his compression pumps and is actually use this 7 times since he was last here in the office. On top of the compression wrap he is now roughly 3 cm better at the calf and 2 cm better at the ankle he also states that his foot seem to go an issue better without even having to use a shoe horn. Obviously I think this is all evidence that he is doing excellent in this regard. The other good news is he does not appear to have anything open today as far as wounds are concerned. 01/15/2019 on evaluation today patient appears to be  doing more poorly yet again with regard to his left lower extremity. He has developed new wounds again after being discharged just recently. Unfortunately this continues to be the case that he will heal and then have subsequent new wounds. The last time I was hopeful that he may not end up coming back too quickly especially since he states he has been using his lymphedema pumps along with wearing his compression. Nonetheless he had a blister on the back of his leg that popped up on the left and this has opened up into an ulceration it is quite painful. 01/22/19 on evaluation today patient actually appears to be doing well with regard to his wound on the left lower extremity. He's been tolerating the dressing changes without complication including the compression wrap in the wound appears to be significantly smaller today which is great news. Overall very pleased in this regard. 01/29/2019 on evaluation today patient appears to be doing well with regard to his left posterior lower extremity ulcer. He has been tolerating the dressing changes without complication. This is not completely healed but is getting much closer. We did order a Farrow wrap 4000 for him he has received this and has it with him today although I am not sure we are quite ready to start him on that as of yet. We are very close. 02/05/2019 on evaluation today patient actually appears to be doing quite well with regard to his left posterior lower extremity ulcer. He still has a very tiny opening remaining but the fortunate thing is he seems to be healing quite nicely. He also did get his Farrow wrap which I am hoping will help with his edema control as well at home. Fortunately there is no evidence of active infection. 02/12/2019 patient and fortunately appears to be doing poorly in regard to his wounds of the left lower extremity. He was very close to healing therefore we attempted to use his Velcro compression wraps continuing with  lymphedema pumps at home. Unfortunately that does not seem to have done very well for him. He tells me that he wore them all the  time but again I am not sure why if that is the case that he is having such significant edema. He is still on his fluid pills as well. With that being said there is no obvious sign of infection although I do wonder about the possibility of infection at this time as well. 02/19/2019 unfortunately upon evaluation today patient appears to be doing more poorly with regard to his left lower extremity. He is not showing signs of significant improvement and I think the biggest issue here is that he does have an infection that appears to likely be Pseudomonas. That is based on the blue-green drainage that were noted at this time. Unfortunately the antibiotic that has been on is not going to take care of this at all. I think they will get a need to switch him to either Levaquin or Cipro and this was discussed with the patient. 02/26/2019 on evaluation today patient's lower extremity on the left appears to be doing significantly better as compared to last evaluation. Fortunately there is no signs of active infection at this time. He has been tolerating the compression wrap without complication in fact he made it the whole week at this point. He is showing signs of excellent improvement I am very happy in this regard. With that being said he is having some issues with infection we did review the results of his culture which I noted today. He did have a positive finding for Enterobacter as well as Alcaligenes faecalis. Fortunately the Levaquin that I placed him on will work for both which is great news. There is no signs of systemic infection at this point. 10/30; left posterior leg wound in the setting of very significant edema and what looks like chronic venous inflammation. He has compression pumps but does not use them. We have been using 3 layer compression. Silver alginate to the  wound as the primary dressing 03/18/2019 on evaluation today patient appears to be doing a little better compared to last time I saw him. He really has not been using his compression pumps he tells me that he is having too much discomfort. He has been keeping his wraps on however. He is only been taking his fluid pills every other day because he states they are not really helping and he has an appointment with his primary care provider at the [facility] tomorrow. Subsequently the wound itself on the left lower extremity does seem to be greatly improved compared to previous. KREG, EARHART ([id number]) 03/25/2019 on evaluation today patient appears to be doing better with regard to his wounds on the bilateral lower extremities. The left is doing excellent the right is also doing better although both still do show some signs of open wounds noted at this point unfortunately. Fortunately there is no signs of active infection at this time. The patient also is not really having any significant pain which is good news. Unfortunately there was some confusion with the referral on vascular disease and as far as getting the patient scheduled there can be contacting him later today to do this fortunately we got this straightened out. 04/01/2019 on evaluation today patient appears to be doing no fevers, chills, nausea, vomiting, or diarrhea. Excellent at this time with regard to his lower extremities. There does not appear to be any open wound at this point which is good news. Fortunately is also no signs of active infection at this time. Overall feel like the patient has done excellent with the compression the problem is every  time we got him to this point and then subsequently go to using his own compression things just go right back to where they were. I am not sure how to address this we can try to get an appointment with vascular for 2 weeks now they have yet to call him. Obviously this has become frustrating for the  patient as well. I think the issue has just been an honest error as far as scheduling is concerned but nonetheless still worn out the point where I am unsure of which direction we should take. 04/08/2019 on evaluation today patient actually appears to be doing well with regard to his lower extremities. There are no open wounds at this time and things seem to be managing quite nicely as far as the overall edema control is concerned. With that being said he does have his compression socks today for [place] to go ahead and reinitiate therapy in that manner at this point. He is going to be going for shoes to be measured on Wednesday and then coincidentally he will also be seeing vascular on Thursday. Overall I think this is good news and again I am hopeful that they will be able to do something for him to help prevent ongoing issues with edema control as well. No fevers, chills, nausea, vomiting, or diarrhea. 04/11/2019 on evaluation today patient actually appears to be doing poorly after just being discharged on Monday of this week. He had been experiencing issues with again blisters especially on the left lower extremity. With that being said he was completely healed and appeared to be doing great this past Monday. He then subsequently has new blisters that formed before his appointment with vascular this morning. He was also measured for shoes in the interim. With that being said we may have figured out what exactly is going on and why he continues to have issues like what we are seeing at this point. He takes his compression stockings off at nighttime and then he ends up having to sleep in his chair for 5-6 hours a night. He sleeps with his feet down he cannot really get him up in the recliner and therefore he is sleeping and the worst possible his position with his feet on the floor for that majority of the time. Again as I explained to him that is about one third at minimum at least one fourth of his day  that he spending with his feet dangling down on the ground and the worst possible position they could be. I think this may be what is causing the issue. Subsequently I am leaning toward thinking that he may need a hospital bed in order to elevate his legs. We likely can have to coordinate this with his primary care provider at the [facility]. Readmission: 01/26/2021 this is a patient who presents for repeat evaluation here in the clinic although it is actually been couple of years since have seen him in fact it was December 2020 when I last saw him. Subsequently he never really healed but did end up being lost to follow-up. He tells me has been having issues ongoing with his lower extremities has bilateral lower extremity lymphedema no real significant or definitive open wounds but in general his lymphedema is way out of control. We were never able to refer him to lymphedema clinic simply due to the fact to be honest we were never able to get him completely healed. I do not see anyone with open wounds. The patient does  have evidence of type 2 diabetes mellitus, lymphedema, chronic venous insufficiency, and hypertension. That really has not changed since his last evaluation. 02/09/2021 upon evaluation today patient appears to be doing a little better in regard to his legs although he still having a tremendous amount of drainage especially on the left leg. Fortunately there does not appear to be any evidence of active infection. Of note when we looked into this further it appears that the patient did not have any absorptive dressing on it was just the 4-layer compression wrap. Nonetheless this is probably big part of the issue here. 10/10; he comes in today with 3 large areas on the upper right lower leg likely remanence of denuded blistering under his compression wraps. He has no other wounds on the right. On the left he has the denuded area on the left medial foot and ankle and on the left dorsal  foot. Massive lymphedema in both feet dorsally. Using Zetuvit under compression We have increased home health visitation to twice a week to change the dressings and will change it once 02/22/2021 upon evaluation today patient appears to be doing well currently with regard to his wounds. He has been tolerating the dressing changes without complication. Fortunately there does not appear to be any evidence of active infection which is great news. No fevers, chills, nausea, vomiting, or diarrhea. The biggest issue I see currently is that home health is not putting any medicine on the actual wounds before wrapping. 03/01/2021 upon evaluation today the patient's right leg actually appears to be doing quite well which is great news there does not appear to be any evidence of active infection at this time. No fevers, chills, nausea, vomiting, or diarrhea. With that being said the patient is having issues on the left foot where he is having significant drainage is also an ammonia smell he does not have any animals at home and this makes me concerned about a bacteria producing urea as a byproduct. Again the possible common organisms will be E. coli, Proteus, and Enterococcus. All 3 of which can be successfully treated with Levaquin. For that reason I think that this may be a good option for [place] to consider placing him on and I did obtain a culture as well for confirmation sake. 03/08/2021 upon evaluation today patient appears to be doing unfortunately still somewhat poorly in regard to his leg ulcerations. He actually has an area on the right leg where he blistered due to the fact that his wrap slid down and caused an area of pinching on his skin and this has led to a significant issue here. 03/15/2021 upon evaluation today patient unfortunately has not been wrapped appropriately with absorptive dressings nor with the appropriate technique for the third layer of the 4-layer compression wrap. These are issues  that we continue to try to address with the home health nurse. Also the absorptive dressing that she had was cut in half and therefore that causes things to leak out it does not actually trap the fluid in regard to the top of the foot overall I think that all these combined are really not seeing things improved significantly here. Fortunately there does not appear to be any signs of significant infection at this time which is good news. He still is having a tremendous amount of drainage. 03/22/2021 upon evaluation today patient appears to be draining tremendously. He still continues to tell me that he is using his pumps 2 times a day and that coupled with  that tells me that he is elevating his legs as well. With that being said all things considered I am really just not seeing the improvement we would expect to see with the 4-layer compression wrap and all the above noted. He in fact had an extremely large Zetuvit dressing on both legs and that they were extremely filled to the max with fluid. This is after just being changed just before the weekend and this ORHAN, MAYORGA ([id number]) is Monday. Nonetheless I am concerned about the fact that there is something going on fairly significant that we cannot get any of this under control and that he is draining this significantly. He supposed be having an echocardiogram it sounds like scheduling has been an issue for him as far as getting in sooner. Its something to do with needing his cousin to drive him because of where it sat and he cannot drive himself to this appointment either way I really think he needs to try to see what he can do about making this happen a little sooner. He tells me he will call today. 03/29/2021 on evaluation today patient appears to be doing about the same in regard to his legs. He did get his cardiology appointment moved up to 6 December which is at least good that is better than what it was before mid December. Overall very  pleased in that regard. 04/05/2021 upon evaluation today patient unfortunately is still doing fairly poorly. There does not appear to be any signs of active infection at this time. No fevers, chills, nausea, vomiting, or diarrhea. Unfortunately I think until his edema is under control and overall fluid overload there is really not to be much chance that I can do much to get him better. This is quite unfortunate and frustrating both for myself and the patient to be perfectly honest. Nonetheless I think that he really needs to have a conversation both with his primary care provider as well as cardiologist he sees the PCP on Monday and cardiology on Wednesday of next week. 04/13/2021 upon evaluation today patient appears to be doing poorly in regard to his bilateral lower extremities his left is still worse than the right. With that being said he has a tremendous amount of drainage he did see his primary care provider yesterday there really was not much there to be done from their perspective. He sees cardiology tomorrow. Nonetheless my biggest concern here is simply that if we do not get the edema under control he is going to continue to have drainage and honestly I think at some point he is going to become infected severely that is my main concern. 04/19/2021 upon evaluation today patient appears to be doing poorly still in regard to his legs. Unfortunately there does not appear to be any signs of infection at this point. He does have a tremendous amount of drainage however. We have not seen the results back from the cardiologist and the echocardiogram that was done. It appears that the patient checked out okay as far as that is concerned with regard to ejection fraction though we still have some issues here to be honest with his diastolic function. I am unsure if this is accounting for everything that we are seeing or not. Either way he has a tremendous amount of drainage from his legs that we are just  not able to control in the outpatient setting at this point. I have reached out to Dr. Rockey Situ his cardiologist to see once he reviews the sheet  if there is anything that he feels like can be done from an outpatient perspective if not then I think the way to go is probably can to be through inpatient admission and diuresis. Otherwise I am not sure how working to get this under control we tried antibiotics, compression wrapping, and I have told the patient to be elevating his legs I am not sure how much he does of this but either way I think that this is still an ongoing issue nonetheless. 04/26/2021 upon evaluation today patient appears to be doing poorly in regard to his legs. He is having a tremendous amount of fluid at this point which is quite unfortunate. Its to the point that he may have had at least 5 to 10 pounds of fluid in his dressings this morning when they were removed these were changed this Friday. Subsequently I think he needs to go to the ER for further evaluation and treatment I think is probably can need diuresis possibly even IV antibiotics been on what the blood work looks like but in general I feel like he needs something to get this under control from an outpatient perspective absent of everywhere I can think of and I cannot get this under control with our traditional measures. I think this is going require more so that we can get him better 12/30; this is a patient with severe bilateral lymphedema. He was hospitalized from 04/26/2021 through 04/29/2021 treated for cellulitis in the setting of lower extremity ulcers and lymphedema. After he left the hospital he is apparently seen for nurse visit our staff contacted cardiology and he has been started on Lasix 40 mg. Apparently his legs have less edema. Lab work from 05/04/2021 showed a BUN of 38 and creatinine of 1.59 these are elevated versus previous where his creatinine seems to have been 1.30 on 12/19 his potassium is 4.3. I  believe the lab work is being followed by cardiology We have him in a 4-layer wrap. Xeroform on the leg wounds and sit to fit on the Berry damage skin on the left dorsal foot versus right dorsal foot. He has compression pumps but does not use them. We have apparently not yet ordered him compression stockings 05/17/2021 upon evaluation today patient's legs though better than last time I personally saw him appear to be getting worse compared to where they were previous. Dr. Quentin Cornwall was actually last 1 to see you I have not seen him since 19 December. That was before he went into the ER. Coming out apparently his legs looked also and they still look better but not as good as they were in the past. 1/16; patient with severe bilateral lymphedema. Severe scaled hyperkeratotic skin on the dorsal aspect of his distal left foot and left medial ankle.. On the right side changes are not as bad. He did not have any weeping edema. Our intake nurse was convinced that he is being compliant with compression pumps 1 hour twice a day 05/31/2021 upon evaluation today patient actually appears to be doing a little bit better in my opinion in regard to his feet. I do not see as much drainage and it being just completely wet as it was previous. Fortunately I do not also see any signs of active infection which is great news as well. 06/07/21 Upon inspection patient's wound bed actually showed signs of doing well he is not nearly as weepy and wet as he has been in the past and overall very pleased in that regard.  Fortunately I do not see any signs of active infection locally or nor systemically at this time. Which is great news. No fevers, chills, nausea, vomiting, or diarrhea. 06/14/2021 upon evaluation today patient appears to be doing well with regard to his right foot I am pleased in that regard. His left foot is still draining quite a bit despite using lymphedema pumps, 4-layer compression wraps, and he tells me elevating  his legs as well. He also has Lasix that he takes twice a day. Nonetheless I believe that this is still good to be an ongoing issue. We have a hard time getting this under control as far as the swelling is concerned. 06/21/2021 upon evaluation today patient appears to be doing decently well in regard to his wounds all things considered. He still has a tremendous amount of drainage and fluid noted at this point. Fortunately I do not see any signs of active infection locally or systemically at this point which is great news. Nonetheless I am unsure where to go and how to do this as far as trying to limit his swelling and weeping from his toes in particular. 06/28/2021 upon evaluation patient unfortunately continues to have significant drainage from his feet. We have been keeping him in a compression wrap and despite this he still continues to have extreme fluid issues he seen his cardiologist he is seeing the nephrologist. We really cannot find any way to get this under control when he did well was when he was in the hospital and they got some of the fluid away. But outside of that we are just struggling to achieve the long-term goal of getting this under control and keeping it under control unfortunately. 07/05/2021 upon evaluation today patient appears to be doing poorly in regard to his feet. Unfortunately this continues to be a significant issue and to be honest I am really not certain what to do about it. I referred him to Dr. Randol Kern at [facility] and he does have an appointment although it is HARJAS, BIGGINS ([id number]) 12 April. He also sees his primary care provider on 6 April. He did not want to see Dr. Haynes Kerns until after he saw his PCP that is the reason the appointment so far out. There is really not much I can do in that regard. Nonetheless I do think that we are still continue to have significant lymphedema issues with significant mount of weeping in regard to the feet and again this has just  become extremely difficult to manage to be honest I am not sure if there is something else that Dr. Haynes Kerns or someone else could recommend he also will be seeing Dr. Dellia Nims in 2 weeks when I am on vacation and at that time I will see if Dr. Dellia Nims has any ideas about where to go from here in the meantime. 07/12/2021 upon evaluation today patient appears to be doing about as well as can be expected with regard to his feet. He does actually see his kidney doctor this Friday. He also will be seeing his primary care provider on April 4 and then following that around mid April he will be seeing Dr. Haynes Kerns at [facility] [address] which was a referral made for him. Again my goal is to try to find out some way to fix this and to be perfectly honest we have had some issues with making any good adjustments. When he was in the hospital and greater amounts of Lasix he was able to get this down and it looked  much better upon discharge. With that being said right now things just are not doing nearly as good as what they used to be. 3/13; patient presents for follow-up. He has been using his lymphedema pumps over the past week. He reports an increase in his Lasix dose. He has no issues or complaints today. He denies signs of infection. 07/26/2021 upon evaluation today patient appears to be doing better in regard to his feet bilaterally. Both are showing signs of much less drainage which is great news and overall very pleased in that regard. Fortunately there does not appear to be any evidence of active infection locally or systemically at this time. No fevers, chills, nausea, vomiting, or diarrhea. 08/02/2021 upon evaluation today patient appears to be doing well with regard to his feet. Both are showing signs of being drier the right pretty much has not really draining much at all which is great news. The left is not draining anywhere close to his much as it was during the last evaluation. This is excellent news and overall very  pleased. 08/09/2021 upon evaluation today patient appears to be doing well with regard to his legs the right leg especially showing signs of excellent improvement which is great news I do not see any evidence of active infection locally or systemically which is great. In regard to the left leg he still has some weeping and drainage but nothing as significant as what it was in the past this is great news. 08-16-2021 upon evaluation today patient appears to actually be doing quite well in my opinion in regard to his feet. This is significantly improved compared to what we previously seen and overall I am extremely pleased in that regard. I do believe that He is actually improving although this is obviously very slow going. 08-23-2021 upon evaluation today patient appears to be doing well currently in regard to his right leg which actually is pretty dry at this point today. Fortunately I do not see any evidence of active infection at this time which is great news. No fevers, chills, nausea, vomiting, or diarrhea. 08-30-2021 upon evaluation today patient appears to be doing well with regard to his lower extremities. The right foot is pretty much completely dry which is great news the left foot though not completely dry seems to be doing decently well. I do not see any signs of active infection locally or systemically which is great news. No fevers, chills, nausea, vomiting, or diarrhea. 09-06-2021 upon evaluation today patient appears to be doing well with regard to his feet in fact now the left foot is almost completely dry as well and I am definitely seeing a lot of significant improvement. With that being said unfortunately he actually appears to have some cellulitis of his right thigh. His toes are also little bit red but this may just be due to the increased swelling. He really is not warm to touch in regard to the toes. 5/8; excellent edema control on the right foot and lower leg there is no open wounds  but we continue to put compression on this otherwise this will breakdown. He is using his compression pumps twice a day at home The area that is problematic is on the left dorsal foot some areas that are not fully epithelialized with very dry fissured skin over this area. 09-20-2021 upon evaluation today patient appears to be doing a little bit worse in regard to swelling at this time. Fortunately I do not see any signs of infection with  that being said the wrap was not on quite as well as what I would like to have seen. I do believe that this has caused a little bit of excess swelling and again we need to try to get this under good control. 09-27-2021 upon evaluation today patient appears to be doing awesome in regard to his feet and legs. Everything is measuring smaller the swelling is down and to be perfectly honest I am extremely pleased with the end of his feet especially on the left side and how dry this is today. I do think we are on the right track here. Objective Constitutional Obese and well-hydrated in no acute distress. Vitals Time Taken: 2:05 PM, Height: 73 in, Weight: 312 lbs, BMI: 41.2, Temperature: 98.5 F, Pulse: 71 bpm, Respiratory Rate: 18 breaths/min, Blood Pressure: 113/62 mmHg. Respiratory normal breathing without difficulty. Psychiatric this patient is able to make decisions and demonstrates good insight into disease process. Alert and Oriented x 3. pleasant and cooperative. [name], [name] ([id number]) General Notes: Upon inspection patient's wound bed actually showed signs at both locations of doing quite well there is actually nothing open at this point and even the swelling is significantly better. There does not appear to be any signs of infection and really no significant drainage. 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 Post procedure Diagnosis Wound #: Same as Pre-Procedure There was a Four Layer Compression Therapy Procedure by [name], RN. Post procedure Diagnosis Wound #: Same as Pre-Procedure Plan Follow-up Appointments: Return Appointment in 1 week. Nurse Visit as needed Other: Home Health: [place]: - [place] for wound care. May utilize formulary equivalent dressing for wound treatment orders unless otherwise specified. Home Health Nurse may visit PRN to address patient s wound care needs. - frequency 3 times per week - patient will be seen once at wound center - home health to see patient 2 times per week Scheduled days for dressing changes to be completed; exception, patient has scheduled wound care visit that day. **Please direct any NON-WOUND related issues/requests for orders to patient's Primary Care Physician. **If current dressing causes regression in wound condition, may D/C ordered dressing product/s and apply Normal Saline Moist Dressing daily until next [place] or Other MD appointment. **Notify Wound Healing Center of regression in wound condition at [phone]. Bathing/ Shower/ Hygiene: May shower with wound dressing protected with water repellent cover or cast protector. No tub bath. Edema Control - Lymphedema / Segmental Compressive Device / Other: Optional: One layer of unna paste to top of compression wrap (to act as an anchor). - Unna paste on calf to secure wrap in place as needed 4 Layer Compression System Lymphedema. - ****bilateral legs-total of 3 x per week bi lat, nystatin powder  between toes on bilateral toes, 2x2 gauze between toes only needed on left side, apply zetuvits/ or formulary for extra absorbant dressing around ankles and foot area, use ABD to cover toes; 2 times per week with HH and 1 time at wound center [place], LIGHT TAN SPIRAL. WHITE WITH YELLOW  LINE FIGURE 8 , COBAN SPIRAL Tubigrip double layer applied - bi lat feet ankle areas Elevate, Exercise Daily and Avoid Standing for Long Periods of Time. Elevate legs to the level of the heart and pump ankles as often as possible Elevate leg(s) parallel to the floor when sitting. Compression Pump: Use compression pump on left lower extremity for 60 minutes, twice daily. - 2 times per day DO YOUR BEST to sleep in the bed at night. DO NOT sleep in your recliner. Long hours of sitting in a recliner leads to swelling of the legs and/or potential wounds on your backside. Other: - Contact prescriber regarding use of diuretics to reduce fluid overload. Off-Loading: Turn and reposition every 2 hours Additional Orders / Instructions: Follow Nutritious Diet and Increase Protein Intake LINO, WICKLIFF ([id number]) Medications-Please add to medication list.: None 1. I would recommend that we going continue with the wound care measures as before and the patient is in agreement with plan. This includes the use of the nystatin along with the zinc which I do believe is doing well into the foot. 2. We will going continue with the compression wrapping and then subsequently we will also continue with the Tubigrip over the end of the foot which I do believe is doing well as well. We have been using a 4-layer compression wrap. 3. After this Friday we will actually switch over to just 1 time a week wraps here in the clinic he is not weeping like he used to use I think this should be just fine. We will see patient back for reevaluation in 1 week here in the clinic. If anything worsens or changes patient will contact our  office for additional recommendations. Electronic Signature(s) Signed: 09/27/2021 2:26:04 PM By: Worthy Keeler PA-C Entered By: Worthy Keeler on 09/27/2021 14:26:04 [name] ([id number]) -------------------------------------------------------------------------------- SuperBill Details Patient Name: [name] Date of Service: 09/27/2021 Medical Record Number: [id number] Patient Account Number: [id number] Date of Birth/Sex: 1951/03/17 (71 y.o. M) Treating RN: [name] Primary Care Provider: Fontaine No Other Clinician: Referring Provider: Felipa Eth Treating Provider/Extender: Skipper Cliche in Treatment: 34 Diagnosis Coding ICD-10 Codes Code Description E11.622 Type 2 diabetes mellitus with other skin ulcer I89.0 Lymphedema, not elsewhere classified I87.2 Venous insufficiency (chronic) (peripheral) L97.822 Non-pressure chronic ulcer of other part of left lower leg with fat layer exposed L97.512 Non-pressure chronic ulcer of other part of right foot with fat layer exposed I10 Essential (primary) hypertension L97.811 Non-pressure chronic ulcer of other part of right lower leg limited to breakdown of skin Facility Procedures CPT4: Description Modifier Quantity Code [id number] BILATERAL: Application of multi-layer venous compression system; leg (below knee), including 1 ankle and foot. Physician Procedures CPT4 Code: [id number] Description: 66440 - WC PHYS LEVEL 3 - EST PT Modifier: Quantity: 1 CPT4 Code: Description: ICD-10 Diagnosis Description E11.622 Type 2 diabetes mellitus with other skin ulcer I89.0 Lymphedema, not elsewhere classified I87.2 Venous insufficiency (chronic) (peripheral) L97.822 Non-pressure chronic ulcer of other part of left lower  leg with fat layer Modifier: exposed Quantity: Electronic Signature(s) Signed: 09/27/2021 2:26:16 PM By: Worthy Keeler PA-C Entered By: Worthy Keeler on 09/27/2021 14:26:16

## 2021-10-06 ENCOUNTER — Encounter (HOSPITAL_BASED_OUTPATIENT_CLINIC_OR_DEPARTMENT_OTHER): Payer: PPO | Admitting: Internal Medicine

## 2021-10-06 DIAGNOSIS — I890 Lymphedema, not elsewhere classified: Secondary | ICD-10-CM | POA: Diagnosis not present

## 2021-10-06 DIAGNOSIS — E11622 Type 2 diabetes mellitus with other skin ulcer: Secondary | ICD-10-CM | POA: Diagnosis not present

## 2021-10-06 DIAGNOSIS — L97512 Non-pressure chronic ulcer of other part of right foot with fat layer exposed: Secondary | ICD-10-CM

## 2021-10-06 DIAGNOSIS — L97822 Non-pressure chronic ulcer of other part of left lower leg with fat layer exposed: Secondary | ICD-10-CM | POA: Diagnosis not present

## 2021-10-06 NOTE — Progress Notes (Signed)
JOBE, MUTCH ([id number]) Visit Report for 10/06/2021 Arrival Information Details Patient Name: [name], Mooney Date of Service: 10/06/2021 11:00 AM Medical Record Number: [id number] Patient Account Number: [id number] Date of Birth/Sex: 02/07/1951 (71 y.o. M) Treating RN: [name] Primary Care [name]: [name] Other Clinician: Referring [name]: [name] Treating Brylei Pedley/Extender: [name] in Treatment: 36 Visit Information History Since Last Visit All ordered tests and consults were completed: Mooney Patient Arrived: [name] Added or deleted any medications: Mooney Arrival Time: 11:08 Any new allergies or adverse reactions: Mooney Accompanied By: self Had a fall or experienced change in Mooney Transfer Assistance: None activities of daily living that may affect Patient Identification Verified: Yes risk of falls: Secondary Verification Process Completed: Yes Signs or symptoms of abuse/neglect since last visito Mooney Patient Requires Transmission-Based Mooney Hospitalized since last visit: Mooney Precautions: Implantable device outside of the clinic excluding Mooney Patient Has Alerts: Yes cellular tissue based products placed in the center Patient Alerts: AVVS consult on file since last visit: Last ABI-R 1.09; L Has Compression in Place as Prescribed: Yes 1.04 Pain Present Now: Mooney Electronic Signature(s) Signed: 10/06/2021 5:04:43 PM By: [name] Coria RN Entered By: [name] on 10/06/2021 11:09:13 [name] ([id number]) -------------------------------------------------------------------------------- Clinic Level of Care Assessment Details Patient Name: [name] Date of Service: 10/06/2021 11:00 AM Medical Record Number: [id number] Patient Account Number: [id number] Date of Birth/Sex: [birth year] (71 y.o. M) Treating RN: [name] Primary Care Kanon Novosel: [name] Other Clinician: Referring [name]: [name] Treating  [name]/Extender: [name] in Treatment: 36 Clinic Level of Care Assessment Items TOOL 1 Quantity Score '[]'$  - Use when EandM and Procedure is performed on INITIAL visit 0 ASSESSMENTS - Nursing Assessment / Reassessment '[]'$  - General Physical Exam (combine w/ comprehensive assessment (listed just below) when performed on new 0 pt. evals) '[]'$  - 0 Comprehensive Assessment (HX, ROS, Risk Assessments, Wounds Hx, etc.) ASSESSMENTS - Wound and Skin Assessment / Reassessment '[]'$  - Dermatologic / Skin Assessment (not related to wound area) 0 ASSESSMENTS - Ostomy and/or Continence Assessment and Care '[]'$  - Incontinence Assessment and Management 0 '[]'$  - 0 Ostomy Care Assessment and Management (repouching, etc.) PROCESS - Coordination of Care '[]'$  - Simple Patient / Family Education for ongoing care 0 '[]'$  - 0 Complex (extensive) Patient / Family Education for ongoing care '[]'$  - 0 Staff obtains Programmer, systems, Records, Test Results / Process Orders '[]'$  - 0 Staff telephones HHA, Nursing Homes / Clarify orders / etc '[]'$  - 0 Routine Transfer to another Facility (non-emergent condition) '[]'$  - 0 Routine Hospital Admission (non-emergent condition) '[]'$  - 0 New Admissions / Biomedical engineer / Ordering NPWT, Apligraf, etc. '[]'$  - 0 Emergency Hospital Admission (emergent condition) PROCESS - Special Needs '[]'$  - Pediatric / Minor Patient Management 0 '[]'$  - 0 Isolation Patient Management '[]'$  - 0 Hearing / Language / Visual special needs '[]'$  - 0 Assessment of Community assistance (transportation, D/C planning, etc.) '[]'$  - 0 Additional assistance / Altered mentation '[]'$  - 0 Support Surface(s) Assessment (bed, cushion, seat, etc.) INTERVENTIONS - Miscellaneous '[]'$  - External ear exam 0 '[]'$  - 0 Patient Transfer (multiple staff / Civil Service fast streamer / Similar devices) '[]'$  - 0 Simple Staple / Suture removal (25 or less) '[]'$  - 0 Complex Staple / Suture removal (26 or more) '[]'$  - 0 Hypo/Hyperglycemic Management  (do not check if billed separately) '[]'$  - 0 Ankle / Brachial Index (ABI) - do not check if billed separately Has the patient been seen at the hospital within the last  three years: Yes Total Score: 0 Level Of Care: ____ [name] ([id number]) Electronic Signature(s) Signed: 10/06/2021 5:04:43 PM By: [name] Coria RN Entered By: [name] on 10/06/2021 11:34:02 [name] ([id number]) -------------------------------------------------------------------------------- Compression Therapy Details Patient Name: [name] Date of Service: 10/06/2021 11:00 AM Medical Record Number: [id number] Patient Account Number: [id number] Date of Birth/Sex: November 08, 1950 (72 y.o. M) Treating RN: [name] Primary Care [name]: [name] Other Clinician: Referring [name]: [name] Treating [name]/Extender: [name] in Treatment: 36 Compression Therapy Performed for Wound Assessment: NonWound Condition Lymphedema - Left Leg Performed By: Clinician [name] Coria, RN Compression Type: Four Layer Post Procedure Diagnosis Same as Pre-procedure Electronic Signature(s) Signed: 10/06/2021 5:04:43 PM By: [name] Coria RN Entered By: [name] on 10/06/2021 11:32:19 [name] ([id number]) -------------------------------------------------------------------------------- Compression Therapy Details Patient Name: [name] Date of Service: 10/06/2021 11:00 AM Medical Record Number: [id number] Patient Account Number: [id number] Date of Birth/Sex: 08-25-1950 (71 y.o. M) Treating RN: [name] Primary Care [name]: [name] Other Clinician: Referring [name]: [name] Treating [name]/Extender: [name] in Treatment: 36 Compression Therapy Performed for Wound Assessment: NonWound Condition Lymphedema - Right Leg Performed By: Clinician [name] Coria, RN Compression Type: Four Layer Post Procedure Diagnosis Same as  Pre-procedure Electronic Signature(s) Signed: 10/06/2021 5:04:43 PM By: [name] Coria RN Entered By: [name] on 10/06/2021 11:33:00 [name] ([id number]) -------------------------------------------------------------------------------- Encounter Discharge Information Details Patient Name: [name] Date of Service: 10/06/2021 11:00 AM Medical Record Number: [id number] Patient Account Number: [id number] Date of Birth/Sex: 09/08/1950 (71 y.o. M) Treating RN: [name] Primary Care Sabrena Gavitt: [name] Other Clinician: Referring Tonya Carlile: [name] Treating Roda Lauture/Extender: [name] in Treatment: 23 Encounter Discharge Information Items Discharge Condition: Stable Ambulatory Status: Walker Discharge Destination: Home Transportation: Private Auto Accompanied By: self Schedule Follow-up Appointment: Yes Clinical Summary of Care: Patient Declined Electronic Signature(s) Signed: 10/06/2021 5:04:43 PM By: [name] Coria RN Entered By: [name] on 10/06/2021 11:35:30 [name] ([id number]) -------------------------------------------------------------------------------- Lower Extremity Assessment Details Patient Name: [name] Date of Service: 10/06/2021 11:00 AM Medical Record Number: [id number] Patient Account Number: [id number] Date of Birth/Sex: 1951-03-24 (71 y.o. M) Treating RN: [name] Primary Care Lakoda Mcanany: [name] Other Clinician: Referring Javian Nudd: [name] Treating Romie Tay/Extender: [name] in Treatment: 36 Edema Assessment Assessed: [Left: Mooney] Patrice Paradise: Mooney] Edema: [Left: Yes] [Right: Yes] Calf Left: Right: Point of Measurement: 35 cm From Medial Instep 37 cm 36 cm Ankle Left: Right: Point of Measurement: 10 cm From Medial Instep 27 cm 24 cm Vascular Assessment Pulses: Dorsalis Pedis Palpable: [Left:Yes] [Right:Yes] Electronic Signature(s) Signed: 10/06/2021 5:04:43  PM By: [name] Coria RN Entered By: [name] on 10/06/2021 11:18:39 [name] ([id number]) -------------------------------------------------------------------------------- Multi Wound Chart Details Patient Name: [name] Date of Service: 10/06/2021 11:00 AM Medical Record Number: [id number] Patient Account Number: [id number] Date of Birth/Sex: 1950-05-24 (71 y.o. M) Treating RN: [name] Primary Care Adryan Shin: [name] Other Clinician: Referring Shenandoah Vandergriff: [name] Treating Ryshawn Sanzone/Extender: [name] in Treatment: 36 Vital Signs Height(in): 73 Pulse(bpm): 75 Weight(lbs): 312 Blood Pressure(mmHg): 120/69 Body Mass Index(BMI): 41.2 Temperature(F): 98.2 Respiratory Rate(breaths/min): 18 Wound Assessments Treatment Notes Electronic Signature(s) Signed: 10/06/2021 5:04:43 PM By: [name] Coria RN Entered By: [name] on 10/06/2021 11:18:51 [name] ([id number]) -------------------------------------------------------------------------------- [place] Details Patient Name: [name] Date of Service: 10/06/2021 11:00 AM Medical Record Number: [id number] Patient Account Number: [id number] Date of Birth/Sex: 09/15/1950 (71 y.o. M) Treating RN: [name] Primary Care Keosha Rossa: [name] Other Clinician: Referring Avanni Turnbaugh:  [name] Treating Stephie Xu/Extender: [name] in Treatment: 36 Active Inactive Soft Tissue Infection Nursing Diagnoses: Impaired tissue integrity Knowledge deficit related to disease process and management Knowledge deficit related to home infection control: handwashing, handling of soiled dressings, supply storage Potential for infection: soft tissue Goals: Patient/caregiver will verbalize understanding of or measures to prevent infection and contamination in the home setting Date Initiated: 09/06/2021 Target Resolution Date: 09/06/2021 Goal  Status: Active Patient's soft tissue infection will resolve Date Initiated: 09/06/2021 Target Resolution Date: 09/06/2021 Goal Status: Active Signs and symptoms of infection will be recognized early to allow for prompt treatment Date Initiated: 09/06/2021 Target Resolution Date: 09/06/2021 Goal Status: Active Interventions: Assess signs and symptoms of infection every visit Provide education on infection Treatment Activities: Education provided on Infection : 09/27/2021 Systemic antibiotics : 09/06/2021 Notes: Electronic Signature(s) Signed: 10/06/2021 5:04:43 PM By: [name] Coria RN Entered By: [name] on 10/06/2021 11:18:43 [name] ([id number]) -------------------------------------------------------------------------------- Pain Assessment Details Patient Name: [name] Date of Service: 10/06/2021 11:00 AM Medical Record Number: [id number] Patient Account Number: [id number] Date of Birth/Sex: 1951/04/21 (71 y.o. M) Treating RN: [name] Primary Care Crystalyn Delia: [name] Other Clinician: Referring Jamesrobert Ohanesian: [name] Treating Saira Kramme/Extender: [name] in Treatment: 36 Active Problems Location of Pain Severity and Description of Pain Patient Has Paino Mooney Site Locations Pain Management and Medication Current Pain Management: Electronic Signature(s) Signed: 10/06/2021 5:04:43 PM By: [name] Coria RN Entered By: [name] on 10/06/2021 11:09:48 [name] ([id number]) -------------------------------------------------------------------------------- Patient/Caregiver Education Details Patient Name: [name] Date of Service: 10/06/2021 11:00 AM Medical Record Number: [id number] Patient Account Number: [id number] Date of Birth/Gender: November 12, 1950 (71 y.o. M) Treating RN: [name] Primary Care Physician: [name] Other Clinician: Referring Physician: Fontaine Mooney Treating Physician/Extender: [name] in Treatment: 72 Education Assessment Education Provided To: Patient Education Topics Provided Infection: Methods: Explain/Verbal Responses: State content correctly Electronic Signature(s) Signed: 10/06/2021 5:04:43 PM By: [name] Coria RN Entered By: [name] on 10/06/2021 11:34:40 [name] ([id number]) -------------------------------------------------------------------------------- [place] Details Patient Name: [name] Date of Service: 10/06/2021 11:00 AM Medical Record Number: [id number] Patient Account Number: [id number] Date of Birth/Sex: 1950-09-22 (71 y.o. M) Treating RN: [name] Primary Care Nataliah Hatlestad: [name] Other Clinician: Referring Fermina Mishkin: [name] Treating Jermiah Soderman/Extender: [name] in Treatment: 36 Vital Signs Time Taken: 11:09 Temperature (F): 98.2 Height (in): 73 Pulse (bpm): 75 Weight (lbs): 312 Respiratory Rate (breaths/min): 18 Body Mass Index (BMI): 41.2 Blood Pressure (mmHg): 120/69 Reference Range: 80 - 120 mg / dl Electronic Signature(s) Signed: 10/06/2021 5:04:43 PM By: [name] Coria RN Entered By: [name] on 10/06/2021 11:09:36

## 2021-10-06 NOTE — Progress Notes (Addendum)
DANFORD, TAT ([id number]) Visit Report for 10/06/2021 Chief Complaint Document Details Patient Name: [name], [name] Date of Service: 10/06/2021 11:00 AM Medical Record Number: [id number] Patient Account Number: [id number] Date of Birth/Sex: 1951-04-26 (71 y.o. M) Treating RN: [name] Primary Care Provider: Fontaine No Other Clinician: Referring Provider: Fontaine No Treating Provider/Extender: Yaakov Guthrie in Treatment: 18 Information Obtained from: Patient Chief Complaint Left LE ulcers Electronic Signature(s) Signed: 10/06/2021 12:03:52 PM By: Kalman Shan DO Entered By: Kalman Shan on 10/06/2021 11:45:38 [name] ([id number]) -------------------------------------------------------------------------------- HPI Details Patient Name: [name] Date of Service: 10/06/2021 11:00 AM Medical Record Number: [id number] Patient Account Number: [id number] Date of Birth/Sex: Apr 29, 1951 (71 y.o. M) Treating RN: [name] Primary Care Provider: Fontaine No Other Clinician: Referring Provider: Fontaine No Treating Provider/Extender: Yaakov Guthrie in Treatment: 2 History of Present Illness HPI Description: 71 year old male who presented to the ER with bilateral lower extremity blisters which had started last week. he has a past medical history of leukemia, diabetes mellitus, hypertension, edema of both lower extremities, his recurrent skin infections, peripheral vascular disease, coronary artery disease, congestive heart failure and peripheral neuropathy. in the ER he was given Rocephin and put on Silvadene cream. he was put on oral doxycycline and was asked to follow-up with the [facility]. His last hemoglobin A1c was 6.6 in December and he checks his blood sugar once a week. He does not have any physicians outside the [place] system. He does not recall any vascular duplex studies done either for arterial or venous disease but  was told to wear compression stockings which he does not use 05/30/2016 -- we have not yet received any of his notes from the [facility] hospital system and his arterial and venous duplex studies are scheduled here in [place] around mid February. We are unable to have his insurance accepted by home health agencies and hence he is getting dressings only once a week. 06/06/16 -- -- I received a call from the patient's PCP at the [facility] and spoke to Dr. Garvin Fila, phone number [phone] and fax number [phone]. She confirmed that no vascular testing was done over the last 5 years and she would be happy to do them if the patient did want them to be done at the [place] and we could fax him a request. Readmission: 71 year old male seen by as in February of this year and was referred to vein and vascular for studies and opinion from the vascular surgeons. The patient returns today with a fresh problem having had blisters on his left lower extremity which have been there for about 5 days and he clearly states that he has been wearing his compression stockings as advised though he could not read the moderate compression and has been wearing light compression. Review of his electronic medical records note that he had lower extremity arterial duplex examination done on 06/23/2016 which showed no hemodynamically significant stenosis in the bilateral lower extremity arterial system. He also had a lower extremity venous reflux examination done on 07/07/2016 and it was noted that he had venous incompetence in the right great saphenous vein and bilateral common femoral veins. Patient was seen by Dr. Tamala Julian on the same day and for some reason his notes do not reflect the venous studies or the arterial studies and he recommended patient do a venous duplex ultrasound to look for reflux and return to see him.he would also consider a lymph pump if required. The patient was told that his workup was normal  and  hence the patient canceled his follow-up appointment. 02/03/17 on evaluation today patient left medial lower extremity blister appears to be doing about the same. It is still continuing to drain and there's still the blistered skin covering the wound bed which is making it difficult for the alternate to do its job. Fortunately there is no evidence of cellulitis. No fevers chills noted. Patient states in general he is not having any significant discomfort. Patient's lower extremity arterial duplex exam revealed that patient was hemodynamically stable with no evidence of stenosis in regard to the bilateral lower extremities. The lower extremity venous reflux exam revealed the patient had venous incontinence noted in the right greater saphenous and bilateral common femoral vein. There is no evidence of deep or superficial vein thrombosis in the bilateral lower extremities. Readmission: 11/12/18 Patient presents for evaluation our clinic today concerning issues that he is having with his left lower extremity. He tells me that a couple weeks ago he began developing blisters on the left lower extremity along with increased swelling. He typically wears his compression stockings on a regular basis is previously been evaluated both here as well is with vascular surgery they would recommend lymphedema pumps but unfortunately that somehow fell through and he never heard anything back from that. Nonetheless I think lymphedema pumps would be beneficial for this patient. He does have a history of hypertension and diabetes. Obviously the chronic venous stasis and lymphedema as well. At this point the blisters have been given in more trouble he states sometimes when the blisters openings able to clean it down with alcohol and it will dry out and do well. Unfortunately that has not been the case this time. He is having some discomfort although this mean these with cleaning the areas he doesn't have discomfort just on a  regular basis. He has not been able to wear his compression stockings since the blisters arose due to the fact that of course it will drain into the socks causing additional issues and he didn't have any way to wrap this otherwise. He has increased to taking his Lasix every day instead of every other day. He sees his primary care provider later this month as well. No fevers, chills, nausea, or vomiting noted at this time. 11/19/18-Patient returns at 1 week, per intake RN the amount of seepage into the compression wraps was definitely improved, overall all the wounds are measuring smaller but continuing silver alginate to the wounds as primary dressing 11/26/18 on evaluation today patient appears to be doing quite well in regard to his left lower [place] ulcers. In fact of the areas that were noted initially he only has two regions still open. There is no evidence of active infection at this time. He still is not heard anything from the company regarding lymphedema pumps as of yet. Again as previously seen vascular they have not recommended any surgical intervention. 12/03/2018 on evaluation today patient actually appears to be doing quite well with regard to his lower extremity ulcers. In fact most of the areas appear to be healed the one spot which does not seem to be completely healed I am unsure of whether or not this is really draining that much but nonetheless there does not appear to be any signs of infection or significant drainage at this point. There is no sign of fever, chills, nausea, vomiting, or diarrhea. Overall I am pleased with how things have progressed I think is very close to being able to transition to his  home compression stockings. YUTO, CAJUSTE ([id number]) 12/10/2018 upon evaluation today patient appears to be doing quite well with regard to his left lower extremity. He has been tolerating the dressing changes without complication. Fortunately there is no signs of active infection  at this time. He appears after thorough evaluation of his leg to only have 1 small area that remains open at this point everything else appears to be almost completely closed. He still have significant swelling of the left lower extremity. We had discussed discussing this with his primary care provider he is not able to see her in person they were at the [facility] and right now the [place] is not seeing patients on site. According to the patient anyway. Subsequently he did speak with her apparently and his primary care provider feels that he may likely have a DVT. With that being said she has not seen his leg she is just going off of his history. Nonetheless that is a concern that the patient now has as well and while I do not feel the DVT is likely we can definitely ensure that that is not the case I will go ahead and see about putting that order in today. Nonetheless otherwise I am in a recommend that we continue with the current wound care measures including the compression therapy most likely. We just need to ensure that his leg is indeed free of any DVTs. 12/17/2018 on evaluation today patient actually appears to be completely healed today. He does have 2 very small areas of blistering although this is not anything too significant at this point which is good news. With that being said I am in agreement with the fact that I think he is completely healed at this point. He does want to get back into his compression stocking. The good news is we have gotten approval from insurance for his lymphedema pumps we received a letter since last saw him last week. The other good news is his study did come back and showed no evidence of a DVT. 12/20/2018 on evaluation today patient presents for follow-up concerning his ongoing issues with his left lower extremity. He was actually discharged last Friday and did fairly well until he states blisters opened this morning. He tells me he has been wearing his compression  stocking although he has a hard time getting this on. There does not appear to be any signs of active infection at this time. No fevers, chills, nausea, vomiting, or diarrhea. 12/27/2018 on evaluation today patient appears to be doing very well with regard to his swelling of the left lower extremity the 4 layer compression wrap seems to have been beneficial for him. Fortunately there is no signs of active infection at this time. Patient has been tolerating the compression wrap without complication and his foot swelling in particular appears to be greatly improved. He does still have a wound on the lateral portion of his left leg I believe this is more of a blister that has now reopened. 01/03/2019 on evaluation today patient actually appears to be doing excellent in regard to his left lower extremity. He did receive his compression pumps and is actually use this 7 times since he was last here in the office. On top of the compression wrap he is now roughly 3 cm better at the calf and 2 cm better at the ankle he also states that his foot seem to go an issue better without even having to use a shoe horn. Obviously I think this  is all evidence that he is doing excellent in this regard. The other good news is he does not appear to have anything open today as far as wounds are concerned. 01/15/2019 on evaluation today patient appears to be doing more poorly yet again with regard to his left lower extremity. He has developed new wounds again after being discharged just recently. Unfortunately this continues to be the case that he will heal and then have subsequent new wounds. The last time I was hopeful that he may not end up coming back too quickly especially since he states he has been using his lymphedema pumps along with wearing his compression. Nonetheless he had a blister on the back of his leg that popped up on the left and this has opened up into an ulceration it is quite painful. 01/22/19 on evaluation  today patient actually appears to be doing well with regard to his wound on the left lower extremity. He's been tolerating the dressing changes without complication including the compression wrap in the wound appears to be significantly smaller today which is great news. Overall very pleased in this regard. 01/29/2019 on evaluation today patient appears to be doing well with regard to his left posterior lower extremity ulcer. He has been tolerating the dressing changes without complication. This is not completely healed but is getting much closer. We did order a Farrow wrap 4000 for him he has received this and has it with him today although I am not sure we are quite ready to start him on that as of yet. We are very close. 02/05/2019 on evaluation today patient actually appears to be doing quite well with regard to his left posterior lower extremity ulcer. He still has a very tiny opening remaining but the fortunate thing is he seems to be healing quite nicely. He also did get his Farrow wrap which I am hoping will help with his edema control as well at home. Fortunately there is no evidence of active infection. 02/12/2019 patient and fortunately appears to be doing poorly in regard to his wounds of the left lower extremity. He was very close to healing therefore we attempted to use his Velcro compression wraps continuing with lymphedema pumps at home. Unfortunately that does not seem to have done very well for him. He tells me that he wore them all the time but again I am not sure why if that is the case that he is having such significant edema. He is still on his fluid pills as well. With that being said there is no obvious sign of infection although I do wonder about the possibility of infection at this time as well. 02/19/2019 unfortunately upon evaluation today patient appears to be doing more poorly with regard to his left lower extremity. He is not showing signs of significant improvement and I  think the biggest issue here is that he does have an infection that appears to likely be Pseudomonas. That is based on the blue-green drainage that were noted at this time. Unfortunately the antibiotic that has been on is not going to take care of this at all. I think they will get a need to switch him to either Levaquin or Cipro and this was discussed with the patient. 02/26/2019 on evaluation today patient's lower extremity on the left appears to be doing significantly better as compared to last evaluation. Fortunately there is no signs of active infection at this time. He has been tolerating the compression wrap without complication in fact he  made it the whole week at this point. He is showing signs of excellent improvement I am very happy in this regard. With that being said he is having some issues with infection we did review the results of his culture which I noted today. He did have a positive finding for Enterobacter as well as Alcaligenes faecalis. Fortunately the Levaquin that I placed him on will work for both which is great news. There is no signs of systemic infection at this point. 10/30; left posterior leg wound in the setting of very significant edema and what looks like chronic venous inflammation. He has compression pumps but does not use them. We have been using 3 layer compression. Silver alginate to the wound as the primary dressing 03/18/2019 on evaluation today patient appears to be doing a little better compared to last time I saw him. He really has not been using his compression pumps he tells me that he is having too much discomfort. He has been keeping his wraps on however. He is only been taking his fluid pills every other day because he states they are not really helping and he has an appointment with his primary care provider at the [facility] tomorrow. Subsequently the wound itself on the left lower extremity does seem to be greatly improved compared to previous. 03/25/2019 on  evaluation today patient appears to be doing better with regard to his wounds on the bilateral lower extremities. The left is doing excellent the right is also doing better although both still do show some signs of open wounds noted at this point unfortunately. Fortunately there is no signs of active infection at this time. The patient also is not really having any significant pain which is good news. Unfortunately there was some confusion with the referral on vascular disease and as far as getting the patient scheduled there can be contacting him later today to do this AADAN, CHENIER ([id number]) fortunately we got this straightened out. 04/01/2019 on evaluation today patient appears to be doing no fevers, chills, nausea, vomiting, or diarrhea. Excellent at this time with regard to his lower extremities. There does not appear to be any open wound at this point which is good news. Fortunately is also no signs of active infection at this time. Overall feel like the patient has done excellent with the compression the problem is every time we got him to this point and then subsequently go to using his own compression things just go right back to where they were. I am not sure how to address this we can try to get an appointment with vascular for 2 weeks now they have yet to call him. Obviously this has become frustrating for the patient as well. I think the issue has just been an honest error as far as scheduling is concerned but nonetheless still worn out the point where I am unsure of which direction we should take. 04/08/2019 on evaluation today patient actually appears to be doing well with regard to his lower extremities. There are no open wounds at this time and things seem to be managing quite nicely as far as the overall edema control is concerned. With that being said he does have his compression socks today for [place] to go ahead and reinitiate therapy in that manner at this point. He is going to be  going for shoes to be measured on Wednesday and then coincidentally he will also be seeing vascular on Thursday. Overall I think this is good news and again I  am hopeful that they will be able to do something for him to help prevent ongoing issues with edema control as well. No fevers, chills, nausea, vomiting, or diarrhea. 04/11/2019 on evaluation today patient actually appears to be doing poorly after just being discharged on Monday of this week. He had been experiencing issues with again blisters especially on the left lower extremity. With that being said he was completely healed and appeared to be doing great this past Monday. He then subsequently has new blisters that formed before his appointment with vascular this morning. He was also measured for shoes in the interim. With that being said we may have figured out what exactly is going on and why he continues to have issues like what we are seeing at this point. He takes his compression stockings off at nighttime and then he ends up having to sleep in his chair for 5-6 hours a night. He sleeps with his feet down he cannot really get him up in the recliner and therefore he is sleeping and the worst possible his position with his feet on the floor for that majority of the time. Again as I explained to him that is about one third at minimum at least one fourth of his day that he spending with his feet dangling down on the ground and the worst possible position they could be. I think this may be what is causing the issue. Subsequently I am leaning toward thinking that he may need a hospital bed in order to elevate his legs. We likely can have to coordinate this with his primary care provider at the [facility]. Readmission: 01/26/2021 this is a patient who presents for repeat evaluation here in the clinic although it is actually been couple of years since have seen him in fact it was December 2020 when I last saw him. Subsequently he never really  healed but did end up being lost to follow-up. He tells me has been having issues ongoing with his lower extremities has bilateral lower extremity lymphedema no real significant or definitive open wounds but in general his lymphedema is way out of control. We were never able to refer him to lymphedema clinic simply due to the fact to be honest we were never able to get him completely healed. I do not see anyone with open wounds. The patient does have evidence of type 2 diabetes mellitus, lymphedema, chronic venous insufficiency, and hypertension. That really has not changed since his last evaluation. 02/09/2021 upon evaluation today patient appears to be doing a little better in regard to his legs although he still having a tremendous amount of drainage especially on the left leg. Fortunately there does not appear to be any evidence of active infection. Of note when we looked into this further it appears that the patient did not have any absorptive dressing on it was just the 4-layer compression wrap. Nonetheless this is probably big part of the issue here. 10/10; he comes in today with 3 large areas on the upper right lower leg likely remanence of denuded blistering under his compression wraps. He has no other wounds on the right. On the left he has the denuded area on the left medial foot and ankle and on the left dorsal foot. Massive lymphedema in both feet dorsally. Using Zetuvit under compression We have increased home health visitation to twice a week to change the dressings and will change it once 02/22/2021 upon evaluation today patient appears to be doing well currently with regard  to his wounds. He has been tolerating the dressing changes without complication. Fortunately there does not appear to be any evidence of active infection which is great news. No fevers, chills, nausea, vomiting, or diarrhea. The biggest issue I see currently is that home health is not putting any medicine on the  actual wounds before wrapping. 03/01/2021 upon evaluation today the patient's right leg actually appears to be doing quite well which is great news there does not appear to be any evidence of active infection at this time. No fevers, chills, nausea, vomiting, or diarrhea. With that being said the patient is having issues on the left foot where he is having significant drainage is also an ammonia smell he does not have any animals at home and this makes me concerned about a bacteria producing urea as a byproduct. Again the possible common organisms will be E. coli, Proteus, and Enterococcus. All 3 of which can be successfully treated with Levaquin. For that reason I think that this may be a good option for [place] to consider placing him on and I did obtain a culture as well for confirmation sake. 03/08/2021 upon evaluation today patient appears to be doing unfortunately still somewhat poorly in regard to his leg ulcerations. He actually has an area on the right leg where he blistered due to the fact that his wrap slid down and caused an area of pinching on his skin and this has led to a significant issue here. 03/15/2021 upon evaluation today patient unfortunately has not been wrapped appropriately with absorptive dressings nor with the appropriate technique for the third layer of the 4-layer compression wrap. These are issues that we continue to try to address with the home health nurse. Also the absorptive dressing that she had was cut in half and therefore that causes things to leak out it does not actually trap the fluid in regard to the top of the foot overall I think that all these combined are really not seeing things improved significantly here. Fortunately there does not appear to be any signs of significant infection at this time which is good news. He still is having a tremendous amount of drainage. 03/22/2021 upon evaluation today patient appears to be draining tremendously. He still continues  to tell me that he is using his pumps 2 times a day and that coupled with that tells me that he is elevating his legs as well. With that being said all things considered I am really just not seeing the improvement we would expect to see with the 4-layer compression wrap and all the above noted. He in fact had an extremely large Zetuvit dressing on both legs and that they were extremely filled to the max with fluid. This is after just being changed just before the weekend and this is Monday. Nonetheless I am concerned about the fact that there is something going on fairly significant that we cannot get any of this under control and that he is draining this significantly. He supposed be having an echocardiogram it sounds like scheduling has been an issue for him as far as getting in sooner. Its something to do with needing his cousin to drive him because of where it sat and he cannot drive himself to this appointment either way I really think he needs to try to see what he can do about making this happen a little sooner. He tells me he will call today. AADIL, SUR ([id number]) 03/29/2021 on evaluation today patient appears to be doing about  the same in regard to his legs. He did get his cardiology appointment moved up to 6 December which is at least good that is better than what it was before mid December. Overall very pleased in that regard. 04/05/2021 upon evaluation today patient unfortunately is still doing fairly poorly. There does not appear to be any signs of active infection at this time. No fevers, chills, nausea, vomiting, or diarrhea. Unfortunately I think until his edema is under control and overall fluid overload there is really not to be much chance that I can do much to get him better. This is quite unfortunate and frustrating both for myself and the patient to be perfectly honest. Nonetheless I think that he really needs to have a conversation both with his primary care provider as well  as cardiologist he sees the PCP on Monday and cardiology on Wednesday of next week. 04/13/2021 upon evaluation today patient appears to be doing poorly in regard to his bilateral lower extremities his left is still worse than the right. With that being said he has a tremendous amount of drainage he did see his primary care provider yesterday there really was not much there to be done from their perspective. He sees cardiology tomorrow. Nonetheless my biggest concern here is simply that if we do not get the edema under control he is going to continue to have drainage and honestly I think at some point he is going to become infected severely that is my main concern. 04/19/2021 upon evaluation today patient appears to be doing poorly still in regard to his legs. Unfortunately there does not appear to be any signs of infection at this point. He does have a tremendous amount of drainage however. We have not seen the results back from the cardiologist and the echocardiogram that was done. It appears that the patient checked out okay as far as that is concerned with regard to ejection fraction though we still have some issues here to be honest with his diastolic function. I am unsure if this is accounting for everything that we are seeing or not. Either way he has a tremendous amount of drainage from his legs that we are just not able to control in the outpatient setting at this point. I have reached out to Dr. Rockey Situ his cardiologist to see once he reviews the sheet if there is anything that he feels like can be done from an outpatient perspective if not then I think the way to go is probably can to be through inpatient admission and diuresis. Otherwise I am not sure how working to get this under control we tried antibiotics, compression wrapping, and I have told the patient to be elevating his legs I am not sure how much he does of this but either way I think that this is still an ongoing issue  nonetheless. 04/26/2021 upon evaluation today patient appears to be doing poorly in regard to his legs. He is having a tremendous amount of fluid at this point which is quite unfortunate. Its to the point that he may have had at least 5 to 10 pounds of fluid in his dressings this morning when they were removed these were changed this Friday. Subsequently I think he needs to go to the ER for further evaluation and treatment I think is probably can need diuresis possibly even IV antibiotics been on what the blood work looks like but in general I feel like he needs something to get this under control from an outpatient  perspective absent of everywhere I can think of and I cannot get this under control with our traditional measures. I think this is going require more so that we can get him better 12/30; this is a patient with severe bilateral lymphedema. He was hospitalized from 04/26/2021 through 04/29/2021 treated for cellulitis in the setting of lower extremity ulcers and lymphedema. After he left the hospital he is apparently seen for nurse visit our staff contacted cardiology and he has been started on Lasix 40 mg. Apparently his legs have less edema. Lab work from 05/04/2021 showed a BUN of 38 and creatinine of 1.59 these are elevated versus previous where his creatinine seems to have been 1.30 on 12/19 his potassium is 4.3. I believe the lab work is being followed by cardiology We have him in a 4-layer wrap. Xeroform on the leg wounds and sit to fit on the Berry damage skin on the left dorsal foot versus right dorsal foot. He has compression pumps but does not use them. We have apparently not yet ordered him compression stockings 05/17/2021 upon evaluation today patient's legs though better than last time I personally saw him appear to be getting worse compared to where they were previous. Dr. Quentin Cornwall was actually last 1 to see you I have not seen him since 19 December. That was before he went into  the ER. Coming out apparently his legs looked also and they still look better but not as good as they were in the past. 1/16; patient with severe bilateral lymphedema. Severe scaled hyperkeratotic skin on the dorsal aspect of his distal left foot and left medial ankle.. On the right side changes are not as bad. He did not have any weeping edema. Our intake nurse was convinced that he is being compliant with compression pumps 1 hour twice a day 05/31/2021 upon evaluation today patient actually appears to be doing a little bit better in my opinion in regard to his feet. I do not see as much drainage and it being just completely wet as it was previous. Fortunately I do not also see any signs of active infection which is great news as well. 06/07/21 Upon inspection patient's wound bed actually showed signs of doing well he is not nearly as weepy and wet as he has been in the past and overall very pleased in that regard. Fortunately I do not see any signs of active infection locally or nor systemically at this time. Which is great news. No fevers, chills, nausea, vomiting, or diarrhea. 06/14/2021 upon evaluation today patient appears to be doing well with regard to his right foot I am pleased in that regard. His left foot is still draining quite a bit despite using lymphedema pumps, 4-layer compression wraps, and he tells me elevating his legs as well. He also has Lasix that he takes twice a day. Nonetheless I believe that this is still good to be an ongoing issue. We have a hard time getting this under control as far as the swelling is concerned. 06/21/2021 upon evaluation today patient appears to be doing decently well in regard to his wounds all things considered. He still has a tremendous amount of drainage and fluid noted at this point. Fortunately I do not see any signs of active infection locally or systemically at this point which is great news. Nonetheless I am unsure where to go and how to do this  as far as trying to limit his swelling and weeping from his toes in particular. 06/28/2021 upon  evaluation patient unfortunately continues to have significant drainage from his feet. We have been keeping him in a compression wrap and despite this he still continues to have extreme fluid issues he seen his cardiologist he is seeing the nephrologist. We really cannot find any way to get this under control when he did well was when he was in the hospital and they got some of the fluid away. But outside of that we are just struggling to achieve the long-term goal of getting this under control and keeping it under control unfortunately. 07/05/2021 upon evaluation today patient appears to be doing poorly in regard to his feet. Unfortunately this continues to be a significant issue and to be honest I am really not certain what to do about it. I referred him to Dr. Randol Kern at [facility] and he does have an appointment although it is 12 April. He also sees his primary care provider on 6 April. He did not want to see Dr. Haynes Kerns until after he saw his PCP that is the reason the appointment so far out. There is really not much I can do in that regard. Nonetheless I do think that we are still continue to have significant lymphedema issues with significant mount of weeping in regard to the feet and again this has just become extremely difficult to manage to be honest I am not sure if there is something else that Dr. Haynes Kerns or someone else could recommend he also will be seeing Dr. Dellia Nims in 2 weeks when I am on vacation and at that time I will see if Dr. Dellia Nims has any ideas about where to go from here in the meantime. ALDAIR, RICKEL ([id number]) 07/12/2021 upon evaluation today patient appears to be doing about as well as can be expected with regard to his feet. He does actually see his kidney doctor this Friday. He also will be seeing his primary care provider on April 4 and then following that around mid April he will be  seeing Dr. Haynes Kerns at [facility] which was a referral made for him. Again my goal is to try to find out some way to fix this and to be perfectly honest we have had some issues with making any good adjustments. When he was in the hospital and greater amounts of Lasix he was able to get this down and it looked much better upon discharge. With that being said right now things just are not doing nearly as good as what they used to be. 3/13; patient presents for follow-up. He has been using his lymphedema pumps over the past week. He reports an increase in his Lasix dose. He has no issues or complaints today. He denies signs of infection. 07/26/2021 upon evaluation today patient appears to be doing better in regard to his feet bilaterally. Both are showing signs of much less drainage which is great news and overall very pleased in that regard. Fortunately there does not appear to be any evidence of active infection locally or systemically at this time. No fevers, chills, nausea, vomiting, or diarrhea. 08/02/2021 upon evaluation today patient appears to be doing well with regard to his feet. Both are showing signs of being drier the right pretty much has not really draining much at all which is great news. The left is not draining anywhere close to his much as it was during the last evaluation. This is excellent news and overall very pleased. 08/09/2021 upon evaluation today patient appears to be doing well with regard to his legs  the right leg especially showing signs of excellent improvement which is great news I do not see any evidence of active infection locally or systemically which is great. In regard to the left leg he still has some weeping and drainage but nothing as significant as what it was in the past this is great news. 08-16-2021 upon evaluation today patient appears to actually be doing quite well in my opinion in regard to his feet. This is significantly improved compared to what we previously seen  and overall I am extremely pleased in that regard. I do believe that He is actually improving although this is obviously very slow going. 08-23-2021 upon evaluation today patient appears to be doing well currently in regard to his right leg which actually is pretty dry at this point today. Fortunately I do not see any evidence of active infection at this time which is great news. No fevers, chills, nausea, vomiting, or diarrhea. 08-30-2021 upon evaluation today patient appears to be doing well with regard to his lower extremities. The right foot is pretty much completely dry which is great news the left foot though not completely dry seems to be doing decently well. I do not see any signs of active infection locally or systemically which is great news. No fevers, chills, nausea, vomiting, or diarrhea. 09-06-2021 upon evaluation today patient appears to be doing well with regard to his feet in fact now the left foot is almost completely dry as well and I am definitely seeing a lot of significant improvement. With that being said unfortunately he actually appears to have some cellulitis of his right thigh. His toes are also little bit red but this may just be due to the increased swelling. He really is not warm to touch in regard to the toes. 5/8; excellent edema control on the right foot and lower leg there is no open wounds but we continue to put compression on this otherwise this will breakdown. He is using his compression pumps twice a day at home The area that is problematic is on the left dorsal foot some areas that are not fully epithelialized with very dry fissured skin over this area. 09-20-2021 upon evaluation today patient appears to be doing a little bit worse in regard to swelling at this time. Fortunately I do not see any signs of infection with that being said the wrap was not on quite as well as what I would like to have seen. I do believe that this has caused a little bit of excess swelling  and again we need to try to get this under good control. 09-27-2021 upon evaluation today patient appears to be doing awesome in regard to his feet and legs. Everything is measuring smaller the swelling is down and to be perfectly honest I am extremely pleased with the end of his feet especially on the left side and how dry this is today. I do think we are on the right track here. 5/31; patient presents for follow-up. He is using nystatin powder to the feet bilaterally under 4-layer compression. He has no issues or complaints today. He states he is going to the lymphedema clinic tomorrow. Since he has no open wounds on the right lower extremity they will be focused on the side. Electronic Signature(s) Signed: 10/06/2021 12:03:52 PM By: Kalman Shan DO Entered By: Kalman Shan on 10/06/2021 11:58:31 [name] ([id number]) -------------------------------------------------------------------------------- Physical Exam Details Patient Name: [name] Date of Service: 10/06/2021 11:00 AM Medical Record Number: [id number] Patient Account  Number: [id number] Date of Birth/Sex: 11-29-1950 (71 y.o. M) Treating RN: [name] Primary Care Provider: Fontaine No Other Clinician: Referring Provider: Fontaine No Treating Provider/Extender: Yaakov Guthrie in Treatment: 58 Constitutional . Cardiovascular . Psychiatric . Notes No open discernible wounds to the lower extremities bilaterally. There is some weeping noted in between the toes on the left side. Good edema control, right side better than the left. No signs of surrounding infection. Electronic Signature(s) Signed: 10/06/2021 12:03:52 PM By: Kalman Shan DO Entered By: Kalman Shan on 10/06/2021 11:59:58 [name] ([id number]) -------------------------------------------------------------------------------- Physician Orders Details Patient Name: [name] Date of Service: 10/06/2021 11:00  AM Medical Record Number: [id number] Patient Account Number: [id number] Date of Birth/Sex: 09-02-1950 (71 y.o. M) Treating RN: [name] Primary Care Provider: Fontaine No Other Clinician: Referring Provider: Fontaine No Treating Provider/Extender: Yaakov Guthrie in Treatment: 8 Verbal / Phone Orders: No Diagnosis Coding Follow-up Appointments o Return Appointment in 1 week. o Nurse Visit as needed o Other: Bathing/ Shower/ Hygiene o May shower with wound dressing protected with water repellent cover or cast protector. o No tub bath. Edema Control - Lymphedema / Segmental Compressive Device / Other Bilateral Lower Extremities o Optional: One layer of unna paste to top of compression wrap (to act as an anchor). - Unna paste on calf to secure wrap in place as needed o 4 Layer Compression System Lymphedema. - ****bilateral legs-q week bi lat, nystatin powder between toes on bilateral toes, 2x2 gauze between toes only needed on left side, apply zetuvits/ or formulary for extra absorbant dressing around ankles and foot area, use ABD to cover toes; COTTON LAYER SPIRAL, LIGHT TAN SPIRAL. WHITE WITH YELLOW LINE FIGURE 8 , COBAN SPIRAL o Tubigrip double layer applied - size D bi lat feet ankle areas o Elevate, Exercise Daily and Avoid Standing for Long Periods of Time. o Elevate legs to the level of the heart and pump ankles as often as possible o Elevate leg(s) parallel to the floor when sitting. o Compression Pump: Use compression pump on left lower extremity for 60 minutes, twice daily. - 2 times per day o DO YOUR BEST to sleep in the bed at night. DO NOT sleep in your recliner. Long hours of sitting in a recliner leads to swelling of the legs and/or potential wounds on your backside. o Other: - Contact prescriber regarding use of diuretics to reduce fluid overload. Off-Loading o Turn and reposition every 2 hours Additional Orders  / Instructions o Follow Nutritious Diet and Increase Protein Intake Electronic Signature(s) Signed: 10/06/2021 1:03:26 PM By: Kalman Shan DO Signed: 10/06/2021 5:04:43 PM By: [name] RN Previous Signature: 10/06/2021 12:03:52 PM Version By: Kalman Shan DO Entered By: [name] on 10/06/2021 12:38:17 [name] ([id number]) -------------------------------------------------------------------------------- Problem List Details Patient Name: [name] Date of Service: 10/06/2021 11:00 AM Medical Record Number: [id number] Patient Account Number: [id number] Date of Birth/Sex: November 17, 1950 (71 y.o. M) Treating RN: [name] Primary Care Provider: Fontaine No Other Clinician: Referring Provider: Fontaine No Treating Provider/Extender: Yaakov Guthrie in Treatment: 104 Active Problems ICD-10 Encounter Code Description Active Date MDM Diagnosis E11.622 Type 2 diabetes mellitus with other skin ulcer 01/26/2021 No Yes I89.0 Lymphedema, not elsewhere classified 01/26/2021 No Yes I87.2 Venous insufficiency (chronic) (peripheral) 01/26/2021 No Yes L97.822 Non-pressure chronic ulcer of other part of left lower leg with fat layer 01/26/2021 No Yes exposed L97.512 Non-pressure chronic ulcer of other part of right foot with fat layer 01/26/2021 No Yes exposed [place] (primary)  hypertension 01/26/2021 No Yes L97.811 Non-pressure chronic ulcer of other part of right lower leg limited to 02/15/2021 No Yes breakdown of skin Inactive Problems Resolved Problems Electronic Signature(s) Signed: 10/06/2021 12:03:52 PM By: Kalman Shan DO Entered By: Kalman Shan on 10/06/2021 11:45:33 [name] ([id number]) -------------------------------------------------------------------------------- Progress Note Details Patient Name: [name] Date of Service: 10/06/2021 11:00 AM Medical Record Number: [id number] Patient Account Number: [id number] Date of  Birth/Sex: 06/17/1950 (71 y.o. M) Treating RN: [name] Primary Care Provider: Fontaine No Other Clinician: Referring Provider: Fontaine No Treating Provider/Extender: Yaakov Guthrie in Treatment: 36 Subjective Chief Complaint Information obtained from Patient Left LE ulcers History of Present Illness (HPI) 71 year old male who presented to the ER with bilateral lower extremity blisters which had started last week. he has a past medical history of leukemia, diabetes mellitus, hypertension, edema of both lower extremities, his recurrent skin infections, peripheral vascular disease, coronary artery disease, congestive heart failure and peripheral neuropathy. in the ER he was given Rocephin and put on Silvadene cream. he was put on oral doxycycline and was asked to follow-up with the [facility]. His last hemoglobin A1c was 6.6 in December and he checks his blood sugar once a week. He does not have any physicians outside the [place] system. He does not recall any vascular duplex studies done either for arterial or venous disease but was told to wear compression stockings which he does not use 05/30/2016 -- we have not yet received any of his notes from the [facility] hospital system and his arterial and venous duplex studies are scheduled here in [place] around mid February. We are unable to have his insurance accepted by home health agencies and hence he is getting dressings only once a week. 06/06/16 -- -- I received a call from the patient's PCP at the [facility] at [facility] and spoke to Dr. Garvin Fila, phone number [phone] and fax number [phone]. She confirmed that no vascular testing was done over the last 5 years and she would be happy to do them if the patient did want them to be done at the [place] and we could fax him a request. Readmission: 71 year old male seen by as in February of this year and was referred to vein and vascular for studies and opinion from the  vascular surgeons. The patient returns today with a fresh problem having had blisters on his left lower extremity which have been there for about 5 days and he clearly states that he has been wearing his compression stockings as advised though he could not read the moderate compression and has been wearing light compression. Review of his electronic medical records note that he had lower extremity arterial duplex examination done on 06/23/2016 which showed no hemodynamically significant stenosis in the bilateral lower extremity arterial system. He also had a lower extremity venous reflux examination done on 07/07/2016 and it was noted that he had venous incompetence in the right great saphenous vein and bilateral common femoral veins. Patient was seen by Dr. Tamala Julian on the same day and for some reason his notes do not reflect the venous studies or the arterial studies and he recommended patient do a venous duplex ultrasound to look for reflux and return to see him.he would also consider a lymph pump if required. The patient was told that his workup was normal and hence the patient canceled his follow-up appointment. 02/03/17 on evaluation today patient left medial lower extremity blister appears to be doing about the same. It is still continuing to  drain and there's still the blistered skin covering the wound bed which is making it difficult for the alternate to do its job. Fortunately there is no evidence of cellulitis. No fevers chills noted. Patient states in general he is not having any significant discomfort. Patient's lower extremity arterial duplex exam revealed that patient was hemodynamically stable with no evidence of stenosis in regard to the bilateral lower extremities. The lower extremity venous reflux exam revealed the patient had venous incontinence noted in the right greater saphenous and bilateral common femoral vein. There is no evidence of deep or superficial vein thrombosis in the  bilateral lower extremities. Readmission: 11/12/18 Patient presents for evaluation our clinic today concerning issues that he is having with his left lower extremity. He tells me that a couple weeks ago he began developing blisters on the left lower extremity along with increased swelling. He typically wears his compression stockings on a regular basis is previously been evaluated both here as well is with vascular surgery they would recommend lymphedema pumps but unfortunately that somehow fell through and he never heard anything back from that. Nonetheless I think lymphedema pumps would be beneficial for this patient. He does have a history of hypertension and diabetes. Obviously the chronic venous stasis and lymphedema as well. At this point the blisters have been given in more trouble he states sometimes when the blisters openings able to clean it down with alcohol and it will dry out and do well. Unfortunately that has not been the case this time. He is having some discomfort although this mean these with cleaning the areas he doesn't have discomfort just on a regular basis. He has not been able to wear his compression stockings since the blisters arose due to the fact that of course it will drain into the socks causing additional issues and he didn't have any way to wrap this otherwise. He has increased to taking his Lasix every day instead of every other day. He sees his primary care provider later this month as well. No fevers, chills, nausea, or vomiting noted at this time. 11/19/18-Patient returns at 1 week, per intake RN the amount of seepage into the compression wraps was definitely improved, overall all the wounds are measuring smaller but continuing silver alginate to the wounds as primary dressing 11/26/18 on evaluation today patient appears to be doing quite well in regard to his left lower [place] ulcers. In fact of the areas that were noted initially he only has two regions still open.  There is no evidence of active infection at this time. He still is not heard anything from the company regarding lymphedema pumps as of yet. Again as previously seen vascular they have not recommended any surgical intervention. DJANGO, NGUYEN ([id number]) 12/03/2018 on evaluation today patient actually appears to be doing quite well with regard to his lower extremity ulcers. In fact most of the areas appear to be healed the one spot which does not seem to be completely healed I am unsure of whether or not this is really draining that much but nonetheless there does not appear to be any signs of infection or significant drainage at this point. There is no sign of fever, chills, nausea, vomiting, or diarrhea. Overall I am pleased with how things have progressed I think is very close to being able to transition to his home compression stockings. 12/10/2018 upon evaluation today patient appears to be doing quite well with regard to his left lower extremity. He has been tolerating the dressing  changes without complication. Fortunately there is no signs of active infection at this time. He appears after thorough evaluation of his leg to only have 1 small area that remains open at this point everything else appears to be almost completely closed. He still have significant swelling of the left lower extremity. We had discussed discussing this with his primary care provider he is not able to see her in person they were at the [facility] and right now the [place] is not seeing patients on site. According to the patient anyway. Subsequently he did speak with her apparently and his primary care provider feels that he may likely have a DVT. With that being said she has not seen his leg she is just going off of his history. Nonetheless that is a concern that the patient now has as well and while I do not feel the DVT is likely we can definitely ensure that that is not the case I will go ahead and see about putting that  order in today. Nonetheless otherwise I am in a recommend that we continue with the current wound care measures including the compression therapy most likely. We just need to ensure that his leg is indeed free of any DVTs. 12/17/2018 on evaluation today patient actually appears to be completely healed today. He does have 2 very small areas of blistering although this is not anything too significant at this point which is good news. With that being said I am in agreement with the fact that I think he is completely healed at this point. He does want to get back into his compression stocking. The good news is we have gotten approval from insurance for his lymphedema pumps we received a letter since last saw him last week. The other good news is his study did come back and showed no evidence of a DVT. 12/20/2018 on evaluation today patient presents for follow-up concerning his ongoing issues with his left lower extremity. He was actually discharged last Friday and did fairly well until he states blisters opened this morning. He tells me he has been wearing his compression stocking although he has a hard time getting this on. There does not appear to be any signs of active infection at this time. No fevers, chills, nausea, vomiting, or diarrhea. 12/27/2018 on evaluation today patient appears to be doing very well with regard to his swelling of the left lower extremity the 4 layer compression wrap seems to have been beneficial for him. Fortunately there is no signs of active infection at this time. Patient has been tolerating the compression wrap without complication and his foot swelling in particular appears to be greatly improved. He does still have a wound on the lateral portion of his left leg I believe this is more of a blister that has now reopened. 01/03/2019 on evaluation today patient actually appears to be doing excellent in regard to his left lower extremity. He did receive his compression pumps  and is actually use this 7 times since he was last here in the office. On top of the compression wrap he is now roughly 3 cm better at the calf and 2 cm better at the ankle he also states that his foot seem to go an issue better without even having to use a shoe horn. Obviously I think this is all evidence that he is doing excellent in this regard. The other good news is he does not appear to have anything open today as far as wounds are  concerned. 01/15/2019 on evaluation today patient appears to be doing more poorly yet again with regard to his left lower extremity. He has developed new wounds again after being discharged just recently. Unfortunately this continues to be the case that he will heal and then have subsequent new wounds. The last time I was hopeful that he may not end up coming back too quickly especially since he states he has been using his lymphedema pumps along with wearing his compression. Nonetheless he had a blister on the back of his leg that popped up on the left and this has opened up into an ulceration it is quite painful. 01/22/19 on evaluation today patient actually appears to be doing well with regard to his wound on the left lower extremity. He's been tolerating the dressing changes without complication including the compression wrap in the wound appears to be significantly smaller today which is great news. Overall very pleased in this regard. 01/29/2019 on evaluation today patient appears to be doing well with regard to his left posterior lower extremity ulcer. He has been tolerating the dressing changes without complication. This is not completely healed but is getting much closer. We did order a Farrow wrap 4000 for him he has received this and has it with him today although I am not sure we are quite ready to start him on that as of yet. We are very close. 02/05/2019 on evaluation today patient actually appears to be doing quite well with regard to his left posterior  lower extremity ulcer. He still has a very tiny opening remaining but the fortunate thing is he seems to be healing quite nicely. He also did get his Farrow wrap which I am hoping will help with his edema control as well at home. Fortunately there is no evidence of active infection. 02/12/2019 patient and fortunately appears to be doing poorly in regard to his wounds of the left lower extremity. He was very close to healing therefore we attempted to use his Velcro compression wraps continuing with lymphedema pumps at home. Unfortunately that does not seem to have done very well for him. He tells me that he wore them all the time but again I am not sure why if that is the case that he is having such significant edema. He is still on his fluid pills as well. With that being said there is no obvious sign of infection although I do wonder about the possibility of infection at this time as well. 02/19/2019 unfortunately upon evaluation today patient appears to be doing more poorly with regard to his left lower extremity. He is not showing signs of significant improvement and I think the biggest issue here is that he does have an infection that appears to likely be Pseudomonas. That is based on the blue-green drainage that were noted at this time. Unfortunately the antibiotic that has been on is not going to take care of this at all. I think they will get a need to switch him to either Levaquin or Cipro and this was discussed with the patient. 02/26/2019 on evaluation today patient's lower extremity on the left appears to be doing significantly better as compared to last evaluation. Fortunately there is no signs of active infection at this time. He has been tolerating the compression wrap without complication in fact he made it the whole week at this point. He is showing signs of excellent improvement I am very happy in this regard. With that being said he is having some issues  with infection we did review  the results of his culture which I noted today. He did have a positive finding for Enterobacter as well as Alcaligenes faecalis. Fortunately the Levaquin that I placed him on will work for both which is great news. There is no signs of systemic infection at this point. 10/30; left posterior leg wound in the setting of very significant edema and what looks like chronic venous inflammation. He has compression pumps but does not use them. We have been using 3 layer compression. Silver alginate to the wound as the primary dressing 03/18/2019 on evaluation today patient appears to be doing a little better compared to last time I saw him. He really has not been using his compression pumps he tells me that he is having too much discomfort. He has been keeping his wraps on however. He is only been taking his fluid pills every other day because he states they are not really helping and he has an appointment with his primary care provider at the [facility] tomorrow. Subsequently the wound itself on the left lower extremity does seem to be greatly improved compared to previous. MATHIS, CASHMAN ([id number]) 03/25/2019 on evaluation today patient appears to be doing better with regard to his wounds on the bilateral lower extremities. The left is doing excellent the right is also doing better although both still do show some signs of open wounds noted at this point unfortunately. Fortunately there is no signs of active infection at this time. The patient also is not really having any significant pain which is good news. Unfortunately there was some confusion with the referral on vascular disease and as far as getting the patient scheduled there can be contacting him later today to do this fortunately we got this straightened out. 04/01/2019 on evaluation today patient appears to be doing no fevers, chills, nausea, vomiting, or diarrhea. Excellent at this time with regard to his lower extremities. There does not appear to be  any open wound at this point which is good news. Fortunately is also no signs of active infection at this time. Overall feel like the patient has done excellent with the compression the problem is every time we got him to this point and then subsequently go to using his own compression things just go right back to where they were. I am not sure how to address this we can try to get an appointment with vascular for 2 weeks now they have yet to call him. Obviously this has become frustrating for the patient as well. I think the issue has just been an honest error as far as scheduling is concerned but nonetheless still worn out the point where I am unsure of which direction we should take. 04/08/2019 on evaluation today patient actually appears to be doing well with regard to his lower extremities. There are no open wounds at this time and things seem to be managing quite nicely as far as the overall edema control is concerned. With that being said he does have his compression socks today for [place] to go ahead and reinitiate therapy in that manner at this point. He is going to be going for shoes to be measured on Wednesday and then coincidentally he will also be seeing vascular on Thursday. Overall I think this is good news and again I am hopeful that they will be able to do something for him to help prevent ongoing issues with edema control as well. No fevers, chills, nausea, vomiting, or diarrhea. 04/11/2019 on  evaluation today patient actually appears to be doing poorly after just being discharged on Monday of this week. He had been experiencing issues with again blisters especially on the left lower extremity. With that being said he was completely healed and appeared to be doing great this past Monday. He then subsequently has new blisters that formed before his appointment with vascular this morning. He was also measured for shoes in the interim. With that being said we may have figured out what  exactly is going on and why he continues to have issues like what we are seeing at this point. He takes his compression stockings off at nighttime and then he ends up having to sleep in his chair for 5-6 hours a night. He sleeps with his feet down he cannot really get him up in the recliner and therefore he is sleeping and the worst possible his position with his feet on the floor for that majority of the time. Again as I explained to him that is about one third at minimum at least one fourth of his day that he spending with his feet dangling down on the ground and the worst possible position they could be. I think this may be what is causing the issue. Subsequently I am leaning toward thinking that he may need a hospital bed in order to elevate his legs. We likely can have to coordinate this with his primary care provider at the [facility]. Readmission: 01/26/2021 this is a patient who presents for repeat evaluation here in the clinic although it is actually been couple of years since have seen him in fact it was December 2020 when I last saw him. Subsequently he never really healed but did end up being lost to follow-up. He tells me has been having issues ongoing with his lower extremities has bilateral lower extremity lymphedema no real significant or definitive open wounds but in general his lymphedema is way out of control. We were never able to refer him to lymphedema clinic simply due to the fact to be honest we were never able to get him completely healed. I do not see anyone with open wounds. The patient does have evidence of type 2 diabetes mellitus, lymphedema, chronic venous insufficiency, and hypertension. That really has not changed since his last evaluation. 02/09/2021 upon evaluation today patient appears to be doing a little better in regard to his legs although he still having a tremendous amount of drainage especially on the left leg. Fortunately there does not appear to be any  evidence of active infection. Of note when we looked into this further it appears that the patient did not have any absorptive dressing on it was just the 4-layer compression wrap. Nonetheless this is probably big part of the issue here. 10/10; he comes in today with 3 large areas on the upper right lower leg likely remanence of denuded blistering under his compression wraps. He has no other wounds on the right. On the left he has the denuded area on the left medial foot and ankle and on the left dorsal foot. Massive lymphedema in both feet dorsally. Using Zetuvit under compression We have increased home health visitation to twice a week to change the dressings and will change it once 02/22/2021 upon evaluation today patient appears to be doing well currently with regard to his wounds. He has been tolerating the dressing changes without complication. Fortunately there does not appear to be any evidence of active infection which is great news. No fevers,  chills, nausea, vomiting, or diarrhea. The biggest issue I see currently is that home health is not putting any medicine on the actual wounds before wrapping. 03/01/2021 upon evaluation today the patient's right leg actually appears to be doing quite well which is great news there does not appear to be any evidence of active infection at this time. No fevers, chills, nausea, vomiting, or diarrhea. With that being said the patient is having issues on the left foot where he is having significant drainage is also an ammonia smell he does not have any animals at home and this makes me concerned about a bacteria producing urea as a byproduct. Again the possible common organisms will be E. coli, Proteus, and Enterococcus. All 3 of which can be successfully treated with Levaquin. For that reason I think that this may be a good option for [place] to consider placing him on and I did obtain a culture as well for confirmation sake. 03/08/2021 upon evaluation today  patient appears to be doing unfortunately still somewhat poorly in regard to his leg ulcerations. He actually has an area on the right leg where he blistered due to the fact that his wrap slid down and caused an area of pinching on his skin and this has led to a significant issue here. 03/15/2021 upon evaluation today patient unfortunately has not been wrapped appropriately with absorptive dressings nor with the appropriate technique for the third layer of the 4-layer compression wrap. These are issues that we continue to try to address with the home health nurse. Also the absorptive dressing that she had was cut in half and therefore that causes things to leak out it does not actually trap the fluid in regard to the top of the foot overall I think that all these combined are really not seeing things improved significantly here. Fortunately there does not appear to be any signs of significant infection at this time which is good news. He still is having a tremendous amount of drainage. 03/22/2021 upon evaluation today patient appears to be draining tremendously. He still continues to tell me that he is using his pumps 2 times a day and that coupled with that tells me that he is elevating his legs as well. With that being said all things considered I am really just not seeing the improvement we would expect to see with the 4-layer compression wrap and all the above noted. He in fact had an extremely large Zetuvit dressing on both legs and that they were extremely filled to the max with fluid. This is after just being changed just before the weekend and this MATTIE, NOVOSEL ([id number]) is Monday. Nonetheless I am concerned about the fact that there is something going on fairly significant that we cannot get any of this under control and that he is draining this significantly. He supposed be having an echocardiogram it sounds like scheduling has been an issue for him as far as getting in sooner. Its  something to do with needing his cousin to drive him because of where it sat and he cannot drive himself to this appointment either way I really think he needs to try to see what he can do about making this happen a little sooner. He tells me he will call today. 03/29/2021 on evaluation today patient appears to be doing about the same in regard to his legs. He did get his cardiology appointment moved up to 6 December which is at least good that is better than what it was  before mid December. Overall very pleased in that regard. 04/05/2021 upon evaluation today patient unfortunately is still doing fairly poorly. There does not appear to be any signs of active infection at this time. No fevers, chills, nausea, vomiting, or diarrhea. Unfortunately I think until his edema is under control and overall fluid overload there is really not to be much chance that I can do much to get him better. This is quite unfortunate and frustrating both for myself and the patient to be perfectly honest. Nonetheless I think that he really needs to have a conversation both with his primary care provider as well as cardiologist he sees the PCP on Monday and cardiology on Wednesday of next week. 04/13/2021 upon evaluation today patient appears to be doing poorly in regard to his bilateral lower extremities his left is still worse than the right. With that being said he has a tremendous amount of drainage he did see his primary care provider yesterday there really was not much there to be done from their perspective. He sees cardiology tomorrow. Nonetheless my biggest concern here is simply that if we do not get the edema under control he is going to continue to have drainage and honestly I think at some point he is going to become infected severely that is my main concern. 04/19/2021 upon evaluation today patient appears to be doing poorly still in regard to his legs. Unfortunately there does not appear to be any signs of  infection at this point. He does have a tremendous amount of drainage however. We have not seen the results back from the cardiologist and the echocardiogram that was done. It appears that the patient checked out okay as far as that is concerned with regard to ejection fraction though we still have some issues here to be honest with his diastolic function. I am unsure if this is accounting for everything that we are seeing or not. Either way he has a tremendous amount of drainage from his legs that we are just not able to control in the outpatient setting at this point. I have reached out to Dr. Rockey Situ his cardiologist to see once he reviews the sheet if there is anything that he feels like can be done from an outpatient perspective if not then I think the way to go is probably can to be through inpatient admission and diuresis. Otherwise I am not sure how working to get this under control we tried antibiotics, compression wrapping, and I have told the patient to be elevating his legs I am not sure how much he does of this but either way I think that this is still an ongoing issue nonetheless. 04/26/2021 upon evaluation today patient appears to be doing poorly in regard to his legs. He is having a tremendous amount of fluid at this point which is quite unfortunate. Its to the point that he may have had at least 5 to 10 pounds of fluid in his dressings this morning when they were removed these were changed this Friday. Subsequently I think he needs to go to the ER for further evaluation and treatment I think is probably can need diuresis possibly even IV antibiotics been on what the blood work looks like but in general I feel like he needs something to get this under control from an outpatient perspective absent of everywhere I can think of and I cannot get this under control with our traditional measures. I think this is going require more so that we can get  him better 12/30; this is a patient with  severe bilateral lymphedema. He was hospitalized from 04/26/2021 through 04/29/2021 treated for cellulitis in the setting of lower extremity ulcers and lymphedema. After he left the hospital he is apparently seen for nurse visit our staff contacted cardiology and he has been started on Lasix 40 mg. Apparently his legs have less edema. Lab work from 05/04/2021 showed a BUN of 38 and creatinine of 1.59 these are elevated versus previous where his creatinine seems to have been 1.30 on 12/19 his potassium is 4.3. I believe the lab work is being followed by cardiology We have him in a 4-layer wrap. Xeroform on the leg wounds and sit to fit on the Berry damage skin on the left dorsal foot versus right dorsal foot. He has compression pumps but does not use them. We have apparently not yet ordered him compression stockings 05/17/2021 upon evaluation today patient's legs though better than last time I personally saw him appear to be getting worse compared to where they were previous. Dr. Quentin Cornwall was actually last 1 to see you I have not seen him since 19 December. That was before he went into the ER. Coming out apparently his legs looked also and they still look better but not as good as they were in the past. 1/16; patient with severe bilateral lymphedema. Severe scaled hyperkeratotic skin on the dorsal aspect of his distal left foot and left medial ankle.. On the right side changes are not as bad. He did not have any weeping edema. Our intake nurse was convinced that he is being compliant with compression pumps 1 hour twice a day 05/31/2021 upon evaluation today patient actually appears to be doing a little bit better in my opinion in regard to his feet. I do not see as much drainage and it being just completely wet as it was previous. Fortunately I do not also see any signs of active infection which is great news as well. 06/07/21 Upon inspection patient's wound bed actually showed signs of doing well he is  not nearly as weepy and wet as he has been in the past and overall very pleased in that regard. Fortunately I do not see any signs of active infection locally or nor systemically at this time. Which is great news. No fevers, chills, nausea, vomiting, or diarrhea. 06/14/2021 upon evaluation today patient appears to be doing well with regard to his right foot I am pleased in that regard. His left foot is still draining quite a bit despite using lymphedema pumps, 4-layer compression wraps, and he tells me elevating his legs as well. He also has Lasix that he takes twice a day. Nonetheless I believe that this is still good to be an ongoing issue. We have a hard time getting this under control as far as the swelling is concerned. 06/21/2021 upon evaluation today patient appears to be doing decently well in regard to his wounds all things considered. He still has a tremendous amount of drainage and fluid noted at this point. Fortunately I do not see any signs of active infection locally or systemically at this point which is great news. Nonetheless I am unsure where to go and how to do this as far as trying to limit his swelling and weeping from his toes in particular. 06/28/2021 upon evaluation patient unfortunately continues to have significant drainage from his feet. We have been keeping him in a compression wrap and despite this he still continues to have extreme fluid issues  he seen his cardiologist he is seeing the nephrologist. We really cannot find any way to get this under control when he did well was when he was in the hospital and they got some of the fluid away. But outside of that we are just struggling to achieve the long-term goal of getting this under control and keeping it under control unfortunately. 07/05/2021 upon evaluation today patient appears to be doing poorly in regard to his feet. Unfortunately this continues to be a significant issue and to be honest I am really not certain what to  do about it. I referred him to Dr. Randol Kern at [facility] and he does have an appointment although it is KANYON, SEIBOLD ([id number]) 12 April. He also sees his primary care provider on 6 April. He did not want to see Dr. Haynes Kerns until after he saw his PCP that is the reason the appointment so far out. There is really not much I can do in that regard. Nonetheless I do think that we are still continue to have significant lymphedema issues with significant mount of weeping in regard to the feet and again this has just become extremely difficult to manage to be honest I am not sure if there is something else that Dr. Haynes Kerns or someone else could recommend he also will be seeing Dr. Dellia Nims in 2 weeks when I am on vacation and at that time I will see if Dr. Dellia Nims has any ideas about where to go from here in the meantime. 07/12/2021 upon evaluation today patient appears to be doing about as well as can be expected with regard to his feet. He does actually see his kidney doctor this Friday. He also will be seeing his primary care provider on April 4 and then following that around mid April he will be seeing Dr. Haynes Kerns at [facility] which was a referral made for him. Again my goal is to try to find out some way to fix this and to be perfectly honest we have had some issues with making any good adjustments. When he was in the hospital and greater amounts of Lasix he was able to get this down and it looked much better upon discharge. With that being said right now things just are not doing nearly as good as what they used to be. 3/13; patient presents for follow-up. He has been using his lymphedema pumps over the past week. He reports an increase in his Lasix dose. He has no issues or complaints today. He denies signs of infection. 07/26/2021 upon evaluation today patient appears to be doing better in regard to his feet bilaterally. Both are showing signs of much less drainage which is great news and overall very pleased in  that regard. Fortunately there does not appear to be any evidence of active infection locally or systemically at this time. No fevers, chills, nausea, vomiting, or diarrhea. 08/02/2021 upon evaluation today patient appears to be doing well with regard to his feet. Both are showing signs of being drier the right pretty much has not really draining much at all which is great news. The left is not draining anywhere close to his much as it was during the last evaluation. This is excellent news and overall very pleased. 08/09/2021 upon evaluation today patient appears to be doing well with regard to his legs the right leg especially showing signs of excellent improvement which is great news I do not see any evidence of active infection locally or systemically which is great. In regard  to the left leg he still has some weeping and drainage but nothing as significant as what it was in the past this is great news. 08-16-2021 upon evaluation today patient appears to actually be doing quite well in my opinion in regard to his feet. This is significantly improved compared to what we previously seen and overall I am extremely pleased in that regard. I do believe that He is actually improving although this is obviously very slow going. 08-23-2021 upon evaluation today patient appears to be doing well currently in regard to his right leg which actually is pretty dry at this point today. Fortunately I do not see any evidence of active infection at this time which is great news. No fevers, chills, nausea, vomiting, or diarrhea. 08-30-2021 upon evaluation today patient appears to be doing well with regard to his lower extremities. The right foot is pretty much completely dry which is great news the left foot though not completely dry seems to be doing decently well. I do not see any signs of active infection locally or systemically which is great news. No fevers, chills, nausea, vomiting, or diarrhea. 09-06-2021 upon  evaluation today patient appears to be doing well with regard to his feet in fact now the left foot is almost completely dry as well and I am definitely seeing a lot of significant improvement. With that being said unfortunately he actually appears to have some cellulitis of his right thigh. His toes are also little bit red but this may just be due to the increased swelling. He really is not warm to touch in regard to the toes. 5/8; excellent edema control on the right foot and lower leg there is no open wounds but we continue to put compression on this otherwise this will breakdown. He is using his compression pumps twice a day at home The area that is problematic is on the left dorsal foot some areas that are not fully epithelialized with very dry fissured skin over this area. 09-20-2021 upon evaluation today patient appears to be doing a little bit worse in regard to swelling at this time. Fortunately I do not see any signs of infection with that being said the wrap was not on quite as well as what I would like to have seen. I do believe that this has caused a little bit of excess swelling and again we need to try to get this under good control. 09-27-2021 upon evaluation today patient appears to be doing awesome in regard to his feet and legs. Everything is measuring smaller the swelling is down and to be perfectly honest I am extremely pleased with the end of his feet especially on the left side and how dry this is today. I do think we are on the right track here. 5/31; patient presents for follow-up. He is using nystatin powder to the feet bilaterally under 4-layer compression. He has no issues or complaints today. He states he is going to the lymphedema clinic tomorrow. Since he has no open wounds on the right lower extremity they will be focused on the side. Objective Constitutional Vitals Time Taken: 11:09 AM, Height: 73 in, Weight: 312 lbs, BMI: 41.2, Temperature: 98.2 F, Pulse: 75 bpm,  Respiratory Rate: 18 breaths/min, Blood Pressure: 120/69 mmHg. General Notes: No open discernible wounds to the lower extremities bilaterally. There is some weeping noted in between the toes on the left side. Good edema control, right side better than the left. No signs of surrounding infection. 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 He does have some weeping noted to the left toes. I recommended continuing the current course of nystatin powder to the feet and 4-layer compression to the legs bilaterally. He will be following in the lymphedema clinic. We will see him back in 1 week. Procedures There was a Four Layer Compression Therapy Procedure by [name], RN. Post procedure Diagnosis Wound #: Same as Pre-Procedure There was a Four Layer Compression Therapy Procedure by [name], RN. Post procedure Diagnosis Wound #: Same as Pre-Procedure Plan Follow-up Appointments: Return Appointment in 1 week. Nurse Visit as needed Other: Home Health: Daniel: - [facility] for wound care. May utilize formulary equivalent dressing for wound treatment orders unless otherwise specified. Home Health Nurse may visit PRN to address patient s wound care needs. - frequency 3 times per week - patient will be seen once at wound center - home health to see patient 2 times per week Scheduled days for dressing changes to be completed; exception, patient has scheduled wound care visit  that day. **Please direct any NON-WOUND related issues/requests for orders to patient's Primary Care Physician. **If current dressing causes regression in wound condition, may D/C ordered dressing product/s and apply Normal Saline Moist Dressing daily until next [place] or Other MD appointment. **Notify Wound Healing Center of regression in wound condition at [phone]. Bathing/ Shower/ Hygiene: May shower with wound dressing protected with water repellent cover or cast protector. No tub bath. Edema Control - Lymphedema / Segmental Compressive Device / Other: Optional: One layer of unna paste to top of compression wrap (to act as an anchor). - Unna paste on calf to secure wrap in place as needed 4 Layer Compression System Lymphedema. - ****bilateral legs-total of 3 x per week bi lat, nystatin powder between toes on bilateral toes, 2x2 gauze between toes only needed on left side, apply zetuvits/ or formulary for extra absorbant dressing around ankles and foot area, use ABD to cover toes; 2 times per week with HH and 1 time at wound center [place], LIGHT TAN SPIRAL. WHITE WITH YELLOW LINE FIGURE 8 , COBAN SPIRAL Tubigrip double layer applied - bi lat feet ankle areas Elevate, Exercise Daily and Avoid Standing for Long Periods of Time. Elevate legs to the level of the heart and pump ankles as often as possible Elevate leg(s) parallel to the floor when sitting. Compression Pump: Use compression pump on left lower extremity for 60 minutes, twice daily. - 2 times per day DO YOUR BEST to sleep in the bed at night. DO NOT sleep in your recliner. Long hours of sitting in a recliner leads to swelling of the legs and/or potential wounds on your backside. Other: - Contact prescriber regarding use of diuretics to reduce fluid overload. Off-Loading: Turn and reposition every 2 hours Additional Orders / Instructions: Follow Nutritious Diet and Increase Protein Intake SKYLAN, LARA ([id number]) Medications-Please add to medication list.: P.O. Antibiotics - Start Doxycycline for right upper leg cellulitis. 1. Nystatin powder and 4-layer compression 2. Follow-up in 1 week Electronic  Signature(s) Signed: 10/06/2021 12:03:52 PM By: Kalman Shan DO Entered By: Kalman Shan on 10/06/2021 12:01:59 [name] ([id number]) -------------------------------------------------------------------------------- [place] Details Patient Name: [name] Date of Service: 10/06/2021 Medical Record Number: [id number] Patient Account Number: [id number] Date of Birth/Sex: 1951-04-19 (71 y.o. M) Treating RN: [name] Primary Care Provider: Fontaine No Other Clinician: Referring Provider: Fontaine No Treating Provider/Extender: Yaakov Guthrie in Treatment: 36 Diagnosis Coding ICD-10 Codes Code Description E11.622 Type 2 diabetes mellitus with other skin ulcer I89.0 Lymphedema, not elsewhere classified I87.2 Venous insufficiency (chronic) (peripheral) L97.822 Non-pressure chronic ulcer of other part of left lower leg with fat layer exposed L97.512 Non-pressure chronic ulcer of other part of right foot with fat layer exposed I10 Essential (primary) hypertension L97.811 Non-pressure chronic ulcer of other part of right lower leg limited to breakdown of skin Facility Procedures CPT4: Description Modifier Quantity Code [id number] BILATERAL: Application of multi-layer venous compression system; leg (below knee), including 1 ankle and foot. Physician Procedures CPT4 Code: 5462703 Description: 50093 - WC PHYS LEVEL 3 - EST PT Modifier: Quantity: 1 CPT4 Code: Description: ICD-10 Diagnosis Description L97.822 Non-pressure chronic ulcer of other part of left lower leg with fat layer L97.512 Non-pressure chronic ulcer of other part of right foot with fat layer exp I89.0 Lymphedema, not elsewhere classified  E11.622 Type 2 diabetes mellitus  with other skin ulcer Modifier: exposed osed Quantity: Electronic Signature(s) Signed: 10/06/2021 12:03:52 PM By: Kalman Shan DO Entered By: Kalman Shan on 10/06/2021 12:02:21

## 2021-10-11 ENCOUNTER — Encounter: Payer: PPO | Attending: Physician Assistant | Admitting: Physician Assistant

## 2021-10-11 DIAGNOSIS — I5040 Unspecified combined systolic (congestive) and diastolic (congestive) heart failure: Secondary | ICD-10-CM | POA: Insufficient documentation

## 2021-10-11 DIAGNOSIS — I110 Hypertensive heart disease with heart failure: Secondary | ICD-10-CM | POA: Insufficient documentation

## 2021-10-11 DIAGNOSIS — E11622 Type 2 diabetes mellitus with other skin ulcer: Secondary | ICD-10-CM | POA: Insufficient documentation

## 2021-10-11 DIAGNOSIS — L97822 Non-pressure chronic ulcer of other part of left lower leg with fat layer exposed: Secondary | ICD-10-CM | POA: Diagnosis not present

## 2021-10-11 DIAGNOSIS — E1142 Type 2 diabetes mellitus with diabetic polyneuropathy: Secondary | ICD-10-CM | POA: Diagnosis not present

## 2021-10-11 DIAGNOSIS — I872 Venous insufficiency (chronic) (peripheral): Secondary | ICD-10-CM | POA: Diagnosis not present

## 2021-10-11 DIAGNOSIS — Z79899 Other long term (current) drug therapy: Secondary | ICD-10-CM | POA: Diagnosis not present

## 2021-10-11 DIAGNOSIS — I890 Lymphedema, not elsewhere classified: Secondary | ICD-10-CM | POA: Diagnosis not present

## 2021-10-11 DIAGNOSIS — Z856 Personal history of leukemia: Secondary | ICD-10-CM | POA: Diagnosis not present

## 2021-10-11 DIAGNOSIS — I2510 Atherosclerotic heart disease of native coronary artery without angina pectoris: Secondary | ICD-10-CM | POA: Insufficient documentation

## 2021-10-11 DIAGNOSIS — L97512 Non-pressure chronic ulcer of other part of right foot with fat layer exposed: Secondary | ICD-10-CM | POA: Diagnosis not present

## 2021-10-11 DIAGNOSIS — L97811 Non-pressure chronic ulcer of other part of right lower leg limited to breakdown of skin: Secondary | ICD-10-CM | POA: Insufficient documentation

## 2021-10-11 NOTE — Progress Notes (Addendum)
CRISTHIAN, VANHOOK ([id number]) Visit Report for 10/11/2021 Chief Complaint Document Details Patient Name: [name], [name] Date of Service: 10/11/2021 11:00 AM Medical Record Number: [id number] Patient Account Number: [id number] Date of Birth/Sex: 30-Aug-1950 (71 y.o. M) Treating RN: [name] Primary Care Provider: Fontaine No Other Clinician: Referring Provider: Fontaine No Treating Provider/Extender: Skipper Cliche in Treatment: 36 Information Obtained from: Patient Chief Complaint Left LE ulcers Electronic Signature(s) Signed: 10/11/2021 11:03:25 AM By: Worthy Keeler PA-C Entered By: Worthy Keeler on 10/11/2021 11:03:25 [name] ([id number]) -------------------------------------------------------------------------------- HPI Details Patient Name: [name] Date of Service: 10/11/2021 11:00 AM Medical Record Number: [id number] Patient Account Number: [id number] Date of Birth/Sex: 03-13-1951 (71 y.o. M) Treating RN: [name] Primary Care Provider: Fontaine No Other Clinician: Referring Provider: Fontaine No Treating Provider/Extender: Skipper Cliche in Treatment: 86 History of Present Illness HPI Description: 71 year old male who presented to the ER with bilateral lower extremity blisters which had started last week. he has a past medical history of leukemia, diabetes mellitus, hypertension, edema of both lower extremities, his recurrent skin infections, peripheral vascular disease, coronary artery disease, congestive heart failure and peripheral neuropathy. in the ER he was given Rocephin and put on Silvadene cream. he was put on oral doxycycline and was asked to follow-up with the [facility]. His last hemoglobin A1c was 6.6 in December and he checks his blood sugar once a week. He does not have any physicians outside the [place] system. He does not recall any vascular duplex studies done either for arterial or venous disease but was told to  wear compression stockings which he does not use 05/30/2016 -- we have not yet received any of his notes from the [facility] hospital system and his arterial and venous duplex studies are scheduled here in [place] around mid February. We are unable to have his insurance accepted by home health agencies and hence he is getting dressings only once a week. 06/06/16 -- -- I received a call from the patient's PCP at the [facility] at [facility] and spoke to Dr. Garvin Fila, phone number [phone] and fax number [phone]. She confirmed that no vascular testing was done over the last 5 years and she would be happy to do them if the patient did want them to be done at the [place] and we could fax him a request. Readmission: 71 year old male seen by as in February of this year and was referred to vein and vascular for studies and opinion from the vascular surgeons. The patient returns today with a fresh problem having had blisters on his left lower extremity which have been there for about 5 days and he clearly states that he has been wearing his compression stockings as advised though he could not read the moderate compression and has been wearing light compression. Review of his electronic medical records note that he had lower extremity arterial duplex examination done on 06/23/2016 which showed no hemodynamically significant stenosis in the bilateral lower extremity arterial system. He also had a lower extremity venous reflux examination done on 07/07/2016 and it was noted that he had venous incompetence in the right great saphenous vein and bilateral common femoral veins. Patient was seen by Dr. Tamala Julian on the same day and for some reason his notes do not reflect the venous studies or the arterial studies and he recommended patient do a venous duplex ultrasound to look for reflux and return to see him.he would also consider a lymph pump if required. The patient was told that his workup  was normal and hence the  patient canceled his follow-up appointment. 02/03/17 on evaluation today patient left medial lower extremity blister appears to be doing about the same. It is still continuing to drain and there's still the blistered skin covering the wound bed which is making it difficult for the alternate to do its job. Fortunately there is no evidence of cellulitis. No fevers chills noted. Patient states in general he is not having any significant discomfort. Patient's lower extremity arterial duplex exam revealed that patient was hemodynamically stable with no evidence of stenosis in regard to the bilateral lower extremities. The lower extremity venous reflux exam revealed the patient had venous incontinence noted in the right greater saphenous and bilateral common femoral vein. There is no evidence of deep or superficial vein thrombosis in the bilateral lower extremities. Readmission: 11/12/18 Patient presents for evaluation our clinic today concerning issues that he is having with his left lower extremity. He tells me that a couple weeks ago he began developing blisters on the left lower extremity along with increased swelling. He typically wears his compression stockings on a regular basis is previously been evaluated both here as well is with vascular surgery they would recommend lymphedema pumps but unfortunately that somehow fell through and he never heard anything back from that. Nonetheless I think lymphedema pumps would be beneficial for this patient. He does have a history of hypertension and diabetes. Obviously the chronic venous stasis and lymphedema as well. At this point the blisters have been given in more trouble he states sometimes when the blisters openings able to clean it down with alcohol and it will dry out and do well. Unfortunately that has not been the case this time. He is having some discomfort although this mean these with cleaning the areas he doesn't have discomfort just on a regular  basis. He has not been able to wear his compression stockings since the blisters arose due to the fact that of course it will drain into the socks causing additional issues and he didn't have any way to wrap this otherwise. He has increased to taking his Lasix every day instead of every other day. He sees his primary care provider later this month as well. No fevers, chills, nausea, or vomiting noted at this time. 11/19/18-Patient returns at 1 week, per intake RN the amount of seepage into the compression wraps was definitely improved, overall all the wounds are measuring smaller but continuing silver alginate to the wounds as primary dressing 11/26/18 on evaluation today patient appears to be doing quite well in regard to his left lower Trinity ulcers. In fact of the areas that were noted initially he only has two regions still open. There is no evidence of active infection at this time. He still is not heard anything from the company regarding lymphedema pumps as of yet. Again as previously seen vascular they have not recommended any surgical intervention. 12/03/2018 on evaluation today patient actually appears to be doing quite well with regard to his lower extremity ulcers. In fact most of the areas appear to be healed the one spot which does not seem to be completely healed I am unsure of whether or not this is really draining that much but nonetheless there does not appear to be any signs of infection or significant drainage at this point. There is no sign of fever, chills, nausea, vomiting, or diarrhea. Overall I am pleased with how things have progressed I think is very close to being able to transition  to his home compression stockings. [name], [name] ([id number]) 12/10/2018 upon evaluation today patient appears to be doing quite well with regard to his left lower extremity. He has been tolerating the dressing changes without complication. Fortunately there is no signs of active infection at this  time. He appears after thorough evaluation of his leg to only have 1 small area that remains open at this point everything else appears to be almost completely closed. He still have significant swelling of the left lower extremity. We had discussed discussing this with his primary care provider he is not able to see her in person they were at the [facility] and right now the [place] is not seeing patients on site. According to the patient anyway. Subsequently he did speak with her apparently and his primary care provider feels that he may likely have a DVT. With that being said she has not seen his leg she is just going off of his history. Nonetheless that is a concern that the patient now has as well and while I do not feel the DVT is likely we can definitely ensure that that is not the case I will go ahead and see about putting that order in today. Nonetheless otherwise I am in a recommend that we continue with the current wound care measures including the compression therapy most likely. We just need to ensure that his leg is indeed free of any DVTs. 12/17/2018 on evaluation today patient actually appears to be completely healed today. He does have 2 very small areas of blistering although this is not anything too significant at this point which is good news. With that being said I am in agreement with the fact that I think he is completely healed at this point. He does want to get back into his compression stocking. The good news is we have gotten approval from insurance for his lymphedema pumps we received a letter since last saw him last week. The other good news is his study did come back and showed no evidence of a DVT. 12/20/2018 on evaluation today patient presents for follow-up concerning his ongoing issues with his left lower extremity. He was actually discharged last Friday and did fairly well until he states blisters opened this morning. He tells me he has been wearing his compression  stocking although he has a hard time getting this on. There does not appear to be any signs of active infection at this time. No fevers, chills, nausea, vomiting, or diarrhea. 12/27/2018 on evaluation today patient appears to be doing very well with regard to his swelling of the left lower extremity the 4 layer compression wrap seems to have been beneficial for him. Fortunately there is no signs of active infection at this time. Patient has been tolerating the compression wrap without complication and his foot swelling in particular appears to be greatly improved. He does still have a wound on the lateral portion of his left leg I believe this is more of a blister that has now reopened. 01/03/2019 on evaluation today patient actually appears to be doing excellent in regard to his left lower extremity. He did receive his compression pumps and is actually use this 7 times since he was last here in the office. On top of the compression wrap he is now roughly 3 cm better at the calf and 2 cm better at the ankle he also states that his foot seem to go an issue better without even having to use a shoe horn. Obviously I  think this is all evidence that he is doing excellent in this regard. The other good news is he does not appear to have anything open today as far as wounds are concerned. 01/15/2019 on evaluation today patient appears to be doing more poorly yet again with regard to his left lower extremity. He has developed new wounds again after being discharged just recently. Unfortunately this continues to be the case that he will heal and then have subsequent new wounds. The last time I was hopeful that he may not end up coming back too quickly especially since he states he has been using his lymphedema pumps along with wearing his compression. Nonetheless he had a blister on the back of his leg that popped up on the left and this has opened up into an ulceration it is quite painful. 01/22/19 on evaluation  today patient actually appears to be doing well with regard to his wound on the left lower extremity. He's been tolerating the dressing changes without complication including the compression wrap in the wound appears to be significantly smaller today which is great news. Overall very pleased in this regard. 01/29/2019 on evaluation today patient appears to be doing well with regard to his left posterior lower extremity ulcer. He has been tolerating the dressing changes without complication. This is not completely healed but is getting much closer. We did order a Farrow wrap 4000 for him he has received this and has it with him today although I am not sure we are quite ready to start him on that as of yet. We are very close. 02/05/2019 on evaluation today patient actually appears to be doing quite well with regard to his left posterior lower extremity ulcer. He still has a very tiny opening remaining but the fortunate thing is he seems to be healing quite nicely. He also did get his Farrow wrap which I am hoping will help with his edema control as well at home. Fortunately there is no evidence of active infection. 02/12/2019 patient and fortunately appears to be doing poorly in regard to his wounds of the left lower extremity. He was very close to healing therefore we attempted to use his Velcro compression wraps continuing with lymphedema pumps at home. Unfortunately that does not seem to have done very well for him. He tells me that he wore them all the time but again I am not sure why if that is the case that he is having such significant edema. He is still on his fluid pills as well. With that being said there is no obvious sign of infection although I do wonder about the possibility of infection at this time as well. 02/19/2019 unfortunately upon evaluation today patient appears to be doing more poorly with regard to his left lower extremity. He is not showing signs of significant improvement and I  think the biggest issue here is that he does have an infection that appears to likely be Pseudomonas. That is based on the blue-green drainage that were noted at this time. Unfortunately the antibiotic that has been on is not going to take care of this at all. I think they will get a need to switch him to either Levaquin or Cipro and this was discussed with the patient. 02/26/2019 on evaluation today patient's lower extremity on the left appears to be doing significantly better as compared to last evaluation. Fortunately there is no signs of active infection at this time. He has been tolerating the compression wrap without complication in  fact he made it the whole week at this point. He is showing signs of excellent improvement I am very happy in this regard. With that being said he is having some issues with infection we did review the results of his culture which I noted today. He did have a positive finding for Enterobacter as well as Alcaligenes faecalis. Fortunately the Levaquin that I placed him on will work for both which is great news. There is no signs of systemic infection at this point. 10/30; left posterior leg wound in the setting of very significant edema and what looks like chronic venous inflammation. He has compression pumps but does not use them. We have been using 3 layer compression. Silver alginate to the wound as the primary dressing 03/18/2019 on evaluation today patient appears to be doing a little better compared to last time I saw him. He really has not been using his compression pumps he tells me that he is having too much discomfort. He has been keeping his wraps on however. He is only been taking his fluid pills every other day because he states they are not really helping and he has an appointment with his primary care provider at the [facility] tomorrow. Subsequently the wound itself on the left lower extremity does seem to be greatly improved compared to previous. 03/25/2019 on  evaluation today patient appears to be doing better with regard to his wounds on the bilateral lower extremities. The left is doing excellent the right is also doing better although both still do show some signs of open wounds noted at this point unfortunately. Fortunately there is no signs of active infection at this time. The patient also is not really having any significant pain which is good news. Unfortunately there was some confusion with the referral on vascular disease and as far as getting the patient scheduled there can be contacting him later today to do this [name], [name] ([id number]) fortunately we got this straightened out. 04/01/2019 on evaluation today patient appears to be doing no fevers, chills, nausea, vomiting, or diarrhea. Excellent at this time with regard to his lower extremities. There does not appear to be any open wound at this point which is good news. Fortunately is also no signs of active infection at this time. Overall feel like the patient has done excellent with the compression the problem is every time we got him to this point and then subsequently go to using his own compression things just go right back to where they were. I am not sure how to address this we can try to get an appointment with vascular for 2 weeks now they have yet to call him. Obviously this has become frustrating for the patient as well. I think the issue has just been an honest error as far as scheduling is concerned but nonetheless still worn out the point where I am unsure of which direction we should take. 04/08/2019 on evaluation today patient actually appears to be doing well with regard to his lower extremities. There are no open wounds at this time and things seem to be managing quite nicely as far as the overall edema control is concerned. With that being said he does have his compression socks today for [place] to go ahead and reinitiate therapy in that manner at this point. He is going to be  going for shoes to be measured on Wednesday and then coincidentally he will also be seeing vascular on Thursday. Overall I think this is good news and  again I am hopeful that they will be able to do something for him to help prevent ongoing issues with edema control as well. No fevers, chills, nausea, vomiting, or diarrhea. 04/11/2019 on evaluation today patient actually appears to be doing poorly after just being discharged on Monday of this week. He had been experiencing issues with again blisters especially on the left lower extremity. With that being said he was completely healed and appeared to be doing great this past Monday. He then subsequently has new blisters that formed before his appointment with vascular this morning. He was also measured for shoes in the interim. With that being said we may have figured out what exactly is going on and why he continues to have issues like what we are seeing at this point. He takes his compression stockings off at nighttime and then he ends up having to sleep in his chair for 5-6 hours a night. He sleeps with his feet down he cannot really get him up in the recliner and therefore he is sleeping and the worst possible his position with his feet on the floor for that majority of the time. Again as I explained to him that is about one third at minimum at least one fourth of his day that he spending with his feet dangling down on the ground and the worst possible position they could be. I think this may be what is causing the issue. Subsequently I am leaning toward thinking that he may need a hospital bed in order to elevate his legs. We likely can have to coordinate this with his primary care provider at the [facility]. Readmission: 01/26/2021 this is a patient who presents for repeat evaluation here in the clinic although it is actually been couple of years since have seen him in fact it was December 2020 when I last saw him. Subsequently he never really  healed but did end up being lost to follow-up. He tells me has been having issues ongoing with his lower extremities has bilateral lower extremity lymphedema no real significant or definitive open wounds but in general his lymphedema is way out of control. We were never able to refer him to lymphedema clinic simply due to the fact to be honest we were never able to get him completely healed. I do not see anyone with open wounds. The patient does have evidence of type 2 diabetes mellitus, lymphedema, chronic venous insufficiency, and hypertension. That really has not changed since his last evaluation. 02/09/2021 upon evaluation today patient appears to be doing a little better in regard to his legs although he still having a tremendous amount of drainage especially on the left leg. Fortunately there does not appear to be any evidence of active infection. Of note when we looked into this further it appears that the patient did not have any absorptive dressing on it was just the 4-layer compression wrap. Nonetheless this is probably big part of the issue here. 10/10; he comes in today with 3 large areas on the upper right lower leg likely remanence of denuded blistering under his compression wraps. He has no other wounds on the right. On the left he has the denuded area on the left medial foot and ankle and on the left dorsal foot. Massive lymphedema in both feet dorsally. Using Zetuvit under compression We have increased home health visitation to twice a week to change the dressings and will change it once 02/22/2021 upon evaluation today patient appears to be doing well currently  with regard to his wounds. He has been tolerating the dressing changes without complication. Fortunately there does not appear to be any evidence of active infection which is great news. No fevers, chills, nausea, vomiting, or diarrhea. The biggest issue I see currently is that home health is not putting any medicine on the  actual wounds before wrapping. 03/01/2021 upon evaluation today the patient's right leg actually appears to be doing quite well which is great news there does not appear to be any evidence of active infection at this time. No fevers, chills, nausea, vomiting, or diarrhea. With that being said the patient is having issues on the left foot where he is having significant drainage is also an ammonia smell he does not have any animals at home and this makes me concerned about a bacteria producing urea as a byproduct. Again the possible common organisms will be E. coli, Proteus, and Enterococcus. All 3 of which can be successfully treated with Levaquin. For that reason I think that this may be a good option for [place] to consider placing him on and I did obtain a culture as well for confirmation sake. 03/08/2021 upon evaluation today patient appears to be doing unfortunately still somewhat poorly in regard to his leg ulcerations. He actually has an area on the right leg where he blistered due to the fact that his wrap slid down and caused an area of pinching on his skin and this has led to a significant issue here. 03/15/2021 upon evaluation today patient unfortunately has not been wrapped appropriately with absorptive dressings nor with the appropriate technique for the third layer of the 4-layer compression wrap. These are issues that we continue to try to address with the home health nurse. Also the absorptive dressing that she had was cut in half and therefore that causes things to leak out it does not actually trap the fluid in regard to the top of the foot overall I think that all these combined are really not seeing things improved significantly here. Fortunately there does not appear to be any signs of significant infection at this time which is good news. He still is having a tremendous amount of drainage. 03/22/2021 upon evaluation today patient appears to be draining tremendously. He still continues  to tell me that he is using his pumps 2 times a day and that coupled with that tells me that he is elevating his legs as well. With that being said all things considered I am really just not seeing the improvement we would expect to see with the 4-layer compression wrap and all the above noted. He in fact had an extremely large Zetuvit dressing on both legs and that they were extremely filled to the max with fluid. This is after just being changed just before the weekend and this is Monday. Nonetheless I am concerned about the fact that there is something going on fairly significant that we cannot get any of this under control and that he is draining this significantly. He supposed be having an echocardiogram it sounds like scheduling has been an issue for him as far as getting in sooner. Its something to do with needing his cousin to drive him because of where it sat and he cannot drive himself to this appointment either way I really think he needs to try to see what he can do about making this happen a little sooner. He tells me he will call today. [name], [name] ([id number]) 03/29/2021 on evaluation today patient appears to be  doing about the same in regard to his legs. He did get his cardiology appointment moved up to 6 December which is at least good that is better than what it was before mid December. Overall very pleased in that regard. 04/05/2021 upon evaluation today patient unfortunately is still doing fairly poorly. There does not appear to be any signs of active infection at this time. No fevers, chills, nausea, vomiting, or diarrhea. Unfortunately I think until his edema is under control and overall fluid overload there is really not to be much chance that I can do much to get him better. This is quite unfortunate and frustrating both for myself and the patient to be perfectly honest. Nonetheless I think that he really needs to have a conversation both with his primary care provider as well  as cardiologist he sees the PCP on Monday and cardiology on Wednesday of next week. 04/13/2021 upon evaluation today patient appears to be doing poorly in regard to his bilateral lower extremities his left is still worse than the right. With that being said he has a tremendous amount of drainage he did see his primary care provider yesterday there really was not much there to be done from their perspective. He sees cardiology tomorrow. Nonetheless my biggest concern here is simply that if we do not get the edema under control he is going to continue to have drainage and honestly I think at some point he is going to become infected severely that is my main concern. 04/19/2021 upon evaluation today patient appears to be doing poorly still in regard to his legs. Unfortunately there does not appear to be any signs of infection at this point. He does have a tremendous amount of drainage however. We have not seen the results back from the cardiologist and the echocardiogram that was done. It appears that the patient checked out okay as far as that is concerned with regard to ejection fraction though we still have some issues here to be honest with his diastolic function. I am unsure if this is accounting for everything that we are seeing or not. Either way he has a tremendous amount of drainage from his legs that we are just not able to control in the outpatient setting at this point. I have reached out to Dr. Rockey Situ his cardiologist to see once he reviews the sheet if there is anything that he feels like can be done from an outpatient perspective if not then I think the way to go is probably can to be through inpatient admission and diuresis. Otherwise I am not sure how working to get this under control we tried antibiotics, compression wrapping, and I have told the patient to be elevating his legs I am not sure how much he does of this but either way I think that this is still an ongoing issue  nonetheless. 04/26/2021 upon evaluation today patient appears to be doing poorly in regard to his legs. He is having a tremendous amount of fluid at this point which is quite unfortunate. Its to the point that he may have had at least 5 to 10 pounds of fluid in his dressings this morning when they were removed these were changed this Friday. Subsequently I think he needs to go to the ER for further evaluation and treatment I think is probably can need diuresis possibly even IV antibiotics been on what the blood work looks like but in general I feel like he needs something to get this under control from  an outpatient perspective absent of everywhere I can think of and I cannot get this under control with our traditional measures. I think this is going require more so that we can get him better 12/30; this is a patient with severe bilateral lymphedema. He was hospitalized from 04/26/2021 through 04/29/2021 treated for cellulitis in the setting of lower extremity ulcers and lymphedema. After he left the hospital he is apparently seen for nurse visit our staff contacted cardiology and he has been started on Lasix 40 mg. Apparently his legs have less edema. Lab work from 05/04/2021 showed a BUN of 38 and creatinine of 1.59 these are elevated versus previous where his creatinine seems to have been 1.30 on 12/19 his potassium is 4.3. I believe the lab work is being followed by cardiology We have him in a 4-layer wrap. Xeroform on the leg wounds and sit to fit on the Berry damage skin on the left dorsal foot versus right dorsal foot. He has compression pumps but does not use them. We have apparently not yet ordered him compression stockings 05/17/2021 upon evaluation today patient's legs though better than last time I personally saw him appear to be getting worse compared to where they were previous. Dr. Quentin Cornwall was actually last 1 to see you I have not seen him since 19 December. That was before he went into  the ER. Coming out apparently his legs looked also and they still look better but not as good as they were in the past. 1/16; patient with severe bilateral lymphedema. Severe scaled hyperkeratotic skin on the dorsal aspect of his distal left foot and left medial ankle.. On the right side changes are not as bad. He did not have any weeping edema. Our intake nurse was convinced that he is being compliant with compression pumps 1 hour twice a day 05/31/2021 upon evaluation today patient actually appears to be doing a little bit better in my opinion in regard to his feet. I do not see as much drainage and it being just completely wet as it was previous. Fortunately I do not also see any signs of active infection which is great news as well. 06/07/21 Upon inspection patient's wound bed actually showed signs of doing well he is not nearly as weepy and wet as he has been in the past and overall very pleased in that regard. Fortunately I do not see any signs of active infection locally or nor systemically at this time. Which is great news. No fevers, chills, nausea, vomiting, or diarrhea. 06/14/2021 upon evaluation today patient appears to be doing well with regard to his right foot I am pleased in that regard. His left foot is still draining quite a bit despite using lymphedema pumps, 4-layer compression wraps, and he tells me elevating his legs as well. He also has Lasix that he takes twice a day. Nonetheless I believe that this is still good to be an ongoing issue. We have a hard time getting this under control as far as the swelling is concerned. 06/21/2021 upon evaluation today patient appears to be doing decently well in regard to his wounds all things considered. He still has a tremendous amount of drainage and fluid noted at this point. Fortunately I do not see any signs of active infection locally or systemically at this point which is great news. Nonetheless I am unsure where to go and how to do this  as far as trying to limit his swelling and weeping from his toes in particular.  06/28/2021 upon evaluation patient unfortunately continues to have significant drainage from his feet. We have been keeping him in a compression wrap and despite this he still continues to have extreme fluid issues he seen his cardiologist he is seeing the nephrologist. We really cannot find any way to get this under control when he did well was when he was in the hospital and they got some of the fluid away. But outside of that we are just struggling to achieve the long-term goal of getting this under control and keeping it under control unfortunately. 07/05/2021 upon evaluation today patient appears to be doing poorly in regard to his feet. Unfortunately this continues to be a significant issue and to be honest I am really not certain what to do about it. I referred him to Dr. Randol Kern at [facility] and he does have an appointment although it is 12 April. He also sees his primary care provider on 6 April. He did not want to see Dr. Haynes Kerns until after he saw his PCP that is the reason the appointment so far out. There is really not much I can do in that regard. Nonetheless I do think that we are still continue to have significant lymphedema issues with significant mount of weeping in regard to the feet and again this has just become extremely difficult to manage to be honest I am not sure if there is something else that Dr. Haynes Kerns or someone else could recommend he also will be seeing Dr. Dellia Nims in 2 weeks when I am on vacation and at that time I will see if Dr. Dellia Nims has any ideas about where to go from here in the meantime. TARRON, KROLAK ([id number]) 07/12/2021 upon evaluation today patient appears to be doing about as well as can be expected with regard to his feet. He does actually see his kidney doctor this Friday. He also will be seeing his primary care provider on April 4 and then following that around mid April he will be  seeing Dr. Haynes Kerns at [facility] which was a referral made for him. Again my goal is to try to find out some way to fix this and to be perfectly honest we have had some issues with making any good adjustments. When he was in the hospital and greater amounts of Lasix he was able to get this down and it looked much better upon discharge. With that being said right now things just are not doing nearly as good as what they used to be. 3/13; patient presents for follow-up. He has been using his lymphedema pumps over the past week. He reports an increase in his Lasix dose. He has no issues or complaints today. He denies signs of infection. 07/26/2021 upon evaluation today patient appears to be doing better in regard to his feet bilaterally. Both are showing signs of much less drainage which is great news and overall very pleased in that regard. Fortunately there does not appear to be any evidence of active infection locally or systemically at this time. No fevers, chills, nausea, vomiting, or diarrhea. 08/02/2021 upon evaluation today patient appears to be doing well with regard to his feet. Both are showing signs of being drier the right pretty much has not really draining much at all which is great news. The left is not draining anywhere close to his much as it was during the last evaluation. This is excellent news and overall very pleased. 08/09/2021 upon evaluation today patient appears to be doing well with regard to  his legs the right leg especially showing signs of excellent improvement which is great news I do not see any evidence of active infection locally or systemically which is great. In regard to the left leg he still has some weeping and drainage but nothing as significant as what it was in the past this is great news. 08-16-2021 upon evaluation today patient appears to actually be doing quite well in my opinion in regard to his feet. This is significantly improved compared to what we previously seen  and overall I am extremely pleased in that regard. I do believe that He is actually improving although this is obviously very slow going. 08-23-2021 upon evaluation today patient appears to be doing well currently in regard to his right leg which actually is pretty dry at this point today. Fortunately I do not see any evidence of active infection at this time which is great news. No fevers, chills, nausea, vomiting, or diarrhea. 08-30-2021 upon evaluation today patient appears to be doing well with regard to his lower extremities. The right foot is pretty much completely dry which is great news the left foot though not completely dry seems to be doing decently well. I do not see any signs of active infection locally or systemically which is great news. No fevers, chills, nausea, vomiting, or diarrhea. 09-06-2021 upon evaluation today patient appears to be doing well with regard to his feet in fact now the left foot is almost completely dry as well and I am definitely seeing a lot of significant improvement. With that being said unfortunately he actually appears to have some cellulitis of his right thigh. His toes are also little bit red but this may just be due to the increased swelling. He really is not warm to touch in regard to the toes. 5/8; excellent edema control on the right foot and lower leg there is no open wounds but we continue to put compression on this otherwise this will breakdown. He is using his compression pumps twice a day at home The area that is problematic is on the left dorsal foot some areas that are not fully epithelialized with very dry fissured skin over this area. 09-20-2021 upon evaluation today patient appears to be doing a little bit worse in regard to swelling at this time. Fortunately I do not see any signs of infection with that being said the wrap was not on quite as well as what I would like to have seen. I do believe that this has caused a little bit of excess swelling  and again we need to try to get this under good control. 09-27-2021 upon evaluation today patient appears to be doing awesome in regard to his feet and legs. Everything is measuring smaller the swelling is down and to be perfectly honest I am extremely pleased with the end of his feet especially on the left side and how dry this is today. I do think we are on the right track here. 5/31; patient presents for follow-up. He is using nystatin powder to the feet bilaterally under 4-layer compression. He has no issues or complaints today. He states he is going to the lymphedema clinic tomorrow. Since he has no open wounds on the right lower extremity they will be focused on the side. 10-11-2021 upon evaluation today patient appears to be doing excellent in regard to his legs. He has been tolerating the dressing changes without complication. Fortunately there does not appear to be any evidence of active infection locally or  systemically at this time which is great news. No fevers, chills, nausea, vomiting, or diarrhea. Electronic Signature(s) Signed: 10/11/2021 11:34:03 AM By: Worthy Keeler PA-C Entered By: Worthy Keeler on 10/11/2021 11:34:03 [name] ([id number]) -------------------------------------------------------------------------------- Physical Exam Details Patient Name: [name] Date of Service: 10/11/2021 11:00 AM Medical Record Number: [id number] Patient Account Number: [id number] Date of Birth/Sex: 27-Jun-1950 (71 y.o. M) Treating RN: [name] Primary Care Provider: Fontaine No Other Clinician: Referring Provider: Fontaine No Treating Provider/Extender: Skipper Cliche in Treatment: 76 Constitutional Well-nourished and well-hydrated in no acute distress. Respiratory normal breathing without difficulty. Psychiatric this patient is able to make decisions and demonstrates good insight into disease process. Alert and Oriented x 3. pleasant and  cooperative. Notes Upon inspection patient's wound bed showed signs of excellent improvement. I am very pleased with where we stand and I think that well on the right track year. He does have an appointment with lymphedema clinic tomorrow. Electronic Signature(s) Signed: 10/11/2021 11:34:18 AM By: Worthy Keeler PA-C Entered By: Worthy Keeler on 10/11/2021 11:34:17 [name] ([id number]) -------------------------------------------------------------------------------- Physician Orders Details Patient Name: [name] Date of Service: 10/11/2021 11:00 AM Medical Record Number: [id number] Patient Account Number: [id number] Date of Birth/Sex: 1951/01/17 (71 y.o. M) Treating RN: [name] Primary Care Provider: Fontaine No Other Clinician: Referring Provider: Fontaine No Treating Provider/Extender: Skipper Cliche in Treatment: 65 Verbal / Phone Orders: No Diagnosis Coding ICD-10 Coding Code Description E11.622 Type 2 diabetes mellitus with other skin ulcer I89.0 Lymphedema, not elsewhere classified I87.2 Venous insufficiency (chronic) (peripheral) L97.822 Non-pressure chronic ulcer of other part of left lower leg with fat layer exposed L97.512 Non-pressure chronic ulcer of other part of right foot with fat layer exposed I10 Essential (primary) hypertension L97.811 Non-pressure chronic ulcer of other part of right lower leg limited to breakdown of skin Follow-up Appointments o Return Appointment in 1 week. o Nurse Visit as needed o Other: Bathing/ Shower/ Hygiene o May shower with wound dressing protected with water repellent cover or cast protector. o No tub bath. Edema Control - Lymphedema / Segmental Compressive Device / Other Bilateral Lower Extremities o Optional: One layer of unna paste to top of compression wrap (to act as an anchor). - Unna paste on calf to secure wrap in place as needed o 4 Layer Compression System Lymphedema. -  ****bilateral legs-q week bi lat, nystatin powder between toes on bilateral toes, 2x2 gauze between toes only needed on left side, apply zetuvits/ or formulary for extra absorbant dressing around ankles and foot area, use ABD to cover toes; tubi grip D to foot and ankle bilat COTTON LAYER SPIRAL, LIGHT TAN SPIRAL. WHITE WITH YELLOW LINE FIGURE 8 , COBAN SPIRAL o Tubigrip double layer applied - size D bi lat feet ankle areas o Elevate, Exercise Daily and Avoid Standing for Long Periods of Time. o Elevate legs to the level of the heart and pump ankles as often as possible o Elevate leg(s) parallel to the floor when sitting. o Compression Pump: Use compression pump on left lower extremity for 60 minutes, twice daily. - 2 times per day o DO YOUR BEST to sleep in the bed at night. DO NOT sleep in your recliner. Long hours of sitting in a recliner leads to swelling of the legs and/or potential wounds on your backside. o Other: - Contact prescriber regarding use of diuretics to reduce fluid overload. Off-Loading o Turn and reposition every 2 hours Additional Orders / Instructions o Follow Nutritious Diet  and Increase Protein Intake Electronic Signature(s) Signed: 10/11/2021 4:19:54 PM By: Worthy Keeler PA-C Signed: 10/11/2021 5:11:25 PM By: [name] RN Entered By: [name] on 10/11/2021 12:37:02 [name] ([id number]) -------------------------------------------------------------------------------- Problem List Details Patient Name: [name] Date of Service: 10/11/2021 11:00 AM Medical Record Number: [id number] Patient Account Number: [id number] Date of Birth/Sex: [birth year] (71 y.o. M) Treating RN: [name] Primary Care Provider: Fontaine No Other Clinician: Referring Provider: Fontaine No Treating Provider/Extender: Skipper Cliche in Treatment: 36 Active Problems ICD-10 Encounter Code Description Active Date MDM Diagnosis E11.622  Type 2 diabetes mellitus with other skin ulcer 01/26/2021 No Yes I89.0 Lymphedema, not elsewhere classified 01/26/2021 No Yes I87.2 Venous insufficiency (chronic) (peripheral) 01/26/2021 No Yes L97.822 Non-pressure chronic ulcer of other part of left lower leg with fat layer 01/26/2021 No Yes exposed L97.512 Non-pressure chronic ulcer of other part of right foot with fat layer 01/26/2021 No Yes exposed [place] (primary) hypertension 01/26/2021 No Yes L97.811 Non-pressure chronic ulcer of other part of right lower leg limited to 02/15/2021 No Yes breakdown of skin Inactive Problems Resolved Problems Electronic Signature(s) Signed: 10/11/2021 11:03:22 AM By: Worthy Keeler PA-C Entered By: Worthy Keeler on 10/11/2021 11:03:21 [name] ([id number]) -------------------------------------------------------------------------------- Progress Note Details Patient Name: [name] Date of Service: 10/11/2021 11:00 AM Medical Record Number: [id number] Patient Account Number: [id number] Date of Birth/Sex: 1951/05/01 (71 y.o. M) Treating RN: [name] Primary Care Provider: Fontaine No Other Clinician: Referring Provider: Fontaine No Treating Provider/Extender: Skipper Cliche in Treatment: 36 Subjective Chief Complaint Information obtained from Patient Left LE ulcers History of Present Illness (HPI) 71 year old male who presented to the ER with bilateral lower extremity blisters which had started last week. he has a past medical history of leukemia, diabetes mellitus, hypertension, edema of both lower extremities, his recurrent skin infections, peripheral vascular disease, coronary artery disease, congestive heart failure and peripheral neuropathy. in the ER he was given Rocephin and put on Silvadene cream. he was put on oral doxycycline and was asked to follow-up with the [facility]. His last hemoglobin A1c was 6.6 in December and he checks his blood sugar once a  week. He does not have any physicians outside the [place] system. He does not recall any vascular duplex studies done either for arterial or venous disease but was told to wear compression stockings which he does not use 05/30/2016 -- we have not yet received any of his notes from the [facility] hospital system and his arterial and venous duplex studies are scheduled here in [place] around mid February. We are unable to have his insurance accepted by home health agencies and hence he is getting dressings only once a week. 06/06/16 -- -- I received a call from the patient's PCP at the [facility] and spoke to Dr. Garvin Fila, phone number [phone] and fax number [phone]. She confirmed that no vascular testing was done over the last 5 years and she would be happy to do them if the patient did want them to be done at the [place] and we could fax him a request. Readmission: 71 year old male seen by as in February of this year and was referred to vein and vascular for studies and opinion from the vascular surgeons. The patient returns today with a fresh problem having had blisters on his left lower extremity which have been there for about 5 days and he clearly states that he has been wearing his compression stockings as advised though he could not read  the moderate compression and has been wearing light compression. Review of his electronic medical records note that he had lower extremity arterial duplex examination done on 06/23/2016 which showed no hemodynamically significant stenosis in the bilateral lower extremity arterial system. He also had a lower extremity venous reflux examination done on 07/07/2016 and it was noted that he had venous incompetence in the right great saphenous vein and bilateral common femoral veins. Patient was seen by Dr. Tamala Julian on the same day and for some reason his notes do not reflect the venous studies or the arterial studies and he recommended patient do a venous  duplex ultrasound to look for reflux and return to see him.he would also consider a lymph pump if required. The patient was told that his workup was normal and hence the patient canceled his follow-up appointment. 02/03/17 on evaluation today patient left medial lower extremity blister appears to be doing about the same. It is still continuing to drain and there's still the blistered skin covering the wound bed which is making it difficult for the alternate to do its job. Fortunately there is no evidence of cellulitis. No fevers chills noted. Patient states in general he is not having any significant discomfort. Patient's lower extremity arterial duplex exam revealed that patient was hemodynamically stable with no evidence of stenosis in regard to the bilateral lower extremities. The lower extremity venous reflux exam revealed the patient had venous incontinence noted in the right greater saphenous and bilateral common femoral vein. There is no evidence of deep or superficial vein thrombosis in the bilateral lower extremities. Readmission: 11/12/18 Patient presents for evaluation our clinic today concerning issues that he is having with his left lower extremity. He tells me that a couple weeks ago he began developing blisters on the left lower extremity along with increased swelling. He typically wears his compression stockings on a regular basis is previously been evaluated both here as well is with vascular surgery they would recommend lymphedema pumps but unfortunately that somehow fell through and he never heard anything back from that. Nonetheless I think lymphedema pumps would be beneficial for this patient. He does have a history of hypertension and diabetes. Obviously the chronic venous stasis and lymphedema as well. At this point the blisters have been given in more trouble he states sometimes when the blisters openings able to clean it down with alcohol and it will dry out and do well.  Unfortunately that has not been the case this time. He is having some discomfort although this mean these with cleaning the areas he doesn't have discomfort just on a regular basis. He has not been able to wear his compression stockings since the blisters arose due to the fact that of course it will drain into the socks causing additional issues and he didn't have any way to wrap this otherwise. He has increased to taking his Lasix every day instead of every other day. He sees his primary care provider later this month as well. No fevers, chills, nausea, or vomiting noted at this time. 11/19/18-Patient returns at 1 week, per intake RN the amount of seepage into the compression wraps was definitely improved, overall all the wounds are measuring smaller but continuing silver alginate to the wounds as primary dressing 11/26/18 on evaluation today patient appears to be doing quite well in regard to his left lower [place] ulcers. In fact of the areas that were noted initially he only has two regions still open. There is no evidence of active infection  at this time. He still is not heard anything from the company regarding lymphedema pumps as of yet. Again as previously seen vascular they have not recommended any surgical intervention. [name], [name] ([id number]) 12/03/2018 on evaluation today patient actually appears to be doing quite well with regard to his lower extremity ulcers. In fact most of the areas appear to be healed the one spot which does not seem to be completely healed I am unsure of whether or not this is really draining that much but nonetheless there does not appear to be any signs of infection or significant drainage at this point. There is no sign of fever, chills, nausea, vomiting, or diarrhea. Overall I am pleased with how things have progressed I think is very close to being able to transition to his home compression stockings. 12/10/2018 upon evaluation today patient appears to be doing  quite well with regard to his left lower extremity. He has been tolerating the dressing changes without complication. Fortunately there is no signs of active infection at this time. He appears after thorough evaluation of his leg to only have 1 small area that remains open at this point everything else appears to be almost completely closed. He still have significant swelling of the left lower extremity. We had discussed discussing this with his primary care provider he is not able to see her in person they were at the [facility] and right now the [place] is not seeing patients on site. According to the patient anyway. Subsequently he did speak with her apparently and his primary care provider feels that he may likely have a DVT. With that being said she has not seen his leg she is just going off of his history. Nonetheless that is a concern that the patient now has as well and while I do not feel the DVT is likely we can definitely ensure that that is not the case I will go ahead and see about putting that order in today. Nonetheless otherwise I am in a recommend that we continue with the current wound care measures including the compression therapy most likely. We just need to ensure that his leg is indeed free of any DVTs. 12/17/2018 on evaluation today patient actually appears to be completely healed today. He does have 2 very small areas of blistering although this is not anything too significant at this point which is good news. With that being said I am in agreement with the fact that I think he is completely healed at this point. He does want to get back into his compression stocking. The good news is we have gotten approval from insurance for his lymphedema pumps we received a letter since last saw him last week. The other good news is his study did come back and showed no evidence of a DVT. 12/20/2018 on evaluation today patient presents for follow-up concerning his ongoing issues with his left  lower extremity. He was actually discharged last Friday and did fairly well until he states blisters opened this morning. He tells me he has been wearing his compression stocking although he has a hard time getting this on. There does not appear to be any signs of active infection at this time. No fevers, chills, nausea, vomiting, or diarrhea. 12/27/2018 on evaluation today patient appears to be doing very well with regard to his swelling of the left lower extremity the 4 layer compression wrap seems to have been beneficial for him. Fortunately there is no signs of active infection at this  time. Patient has been tolerating the compression wrap without complication and his foot swelling in particular appears to be greatly improved. He does still have a wound on the lateral portion of his left leg I believe this is more of a blister that has now reopened. 01/03/2019 on evaluation today patient actually appears to be doing excellent in regard to his left lower extremity. He did receive his compression pumps and is actually use this 7 times since he was last here in the office. On top of the compression wrap he is now roughly 3 cm better at the calf and 2 cm better at the ankle he also states that his foot seem to go an issue better without even having to use a shoe horn. Obviously I think this is all evidence that he is doing excellent in this regard. The other good news is he does not appear to have anything open today as far as wounds are concerned. 01/15/2019 on evaluation today patient appears to be doing more poorly yet again with regard to his left lower extremity. He has developed new wounds again after being discharged just recently. Unfortunately this continues to be the case that he will heal and then have subsequent new wounds. The last time I was hopeful that he may not end up coming back too quickly especially since he states he has been using his lymphedema pumps along with wearing his  compression. Nonetheless he had a blister on the back of his leg that popped up on the left and this has opened up into an ulceration it is quite painful. 01/22/19 on evaluation today patient actually appears to be doing well with regard to his wound on the left lower extremity. He's been tolerating the dressing changes without complication including the compression wrap in the wound appears to be significantly smaller today which is great news. Overall very pleased in this regard. 01/29/2019 on evaluation today patient appears to be doing well with regard to his left posterior lower extremity ulcer. He has been tolerating the dressing changes without complication. This is not completely healed but is getting much closer. We did order a Farrow wrap 4000 for him he has received this and has it with him today although I am not sure we are quite ready to start him on that as of yet. We are very close. 02/05/2019 on evaluation today patient actually appears to be doing quite well with regard to his left posterior lower extremity ulcer. He still has a very tiny opening remaining but the fortunate thing is he seems to be healing quite nicely. He also did get his Farrow wrap which I am hoping will help with his edema control as well at home. Fortunately there is no evidence of active infection. 02/12/2019 patient and fortunately appears to be doing poorly in regard to his wounds of the left lower extremity. He was very close to healing therefore we attempted to use his Velcro compression wraps continuing with lymphedema pumps at home. Unfortunately that does not seem to have done very well for him. He tells me that he wore them all the time but again I am not sure why if that is the case that he is having such significant edema. He is still on his fluid pills as well. With that being said there is no obvious sign of infection although I do wonder about the possibility of infection at this time as  well. 02/19/2019 unfortunately upon evaluation today patient appears to be doing  more poorly with regard to his left lower extremity. He is not showing signs of significant improvement and I think the biggest issue here is that he does have an infection that appears to likely be Pseudomonas. That is based on the blue-green drainage that were noted at this time. Unfortunately the antibiotic that has been on is not going to take care of this at all. I think they will get a need to switch him to either Levaquin or Cipro and this was discussed with the patient. 02/26/2019 on evaluation today patient's lower extremity on the left appears to be doing significantly better as compared to last evaluation. Fortunately there is no signs of active infection at this time. He has been tolerating the compression wrap without complication in fact he made it the whole week at this point. He is showing signs of excellent improvement I am very happy in this regard. With that being said he is having some issues with infection we did review the results of his culture which I noted today. He did have a positive finding for Enterobacter as well as Alcaligenes faecalis. Fortunately the Levaquin that I placed him on will work for both which is great news. There is no signs of systemic infection at this point. 10/30; left posterior leg wound in the setting of very significant edema and what looks like chronic venous inflammation. He has compression pumps but does not use them. We have been using 3 layer compression. Silver alginate to the wound as the primary dressing 03/18/2019 on evaluation today patient appears to be doing a little better compared to last time I saw him. He really has not been using his compression pumps he tells me that he is having too much discomfort. He has been keeping his wraps on however. He is only been taking his fluid pills every other day because he states they are not really helping and he has an  appointment with his primary care provider at the [facility] tomorrow. Subsequently the wound itself on the left lower extremity does seem to be greatly improved compared to previous. [name], [name] ([id number]) 03/25/2019 on evaluation today patient appears to be doing better with regard to his wounds on the bilateral lower extremities. The left is doing excellent the right is also doing better although both still do show some signs of open wounds noted at this point unfortunately. Fortunately there is no signs of active infection at this time. The patient also is not really having any significant pain which is good news. Unfortunately there was some confusion with the referral on vascular disease and as far as getting the patient scheduled there can be contacting him later today to do this fortunately we got this straightened out. 04/01/2019 on evaluation today patient appears to be doing no fevers, chills, nausea, vomiting, or diarrhea. Excellent at this time with regard to his lower extremities. There does not appear to be any open wound at this point which is good news. Fortunately is also no signs of active infection at this time. Overall feel like the patient has done excellent with the compression the problem is every time we got him to this point and then subsequently go to using his own compression things just go right back to where they were. I am not sure how to address this we can try to get an appointment with vascular for 2 weeks now they have yet to call him. Obviously this has become frustrating for the patient as well. I think  the issue has just been an honest error as far as scheduling is concerned but nonetheless still worn out the point where I am unsure of which direction we should take. 04/08/2019 on evaluation today patient actually appears to be doing well with regard to his lower extremities. There are no open wounds at this time and things seem to be managing quite nicely as far as  the overall edema control is concerned. With that being said he does have his compression socks today for [place] to go ahead and reinitiate therapy in that manner at this point. He is going to be going for shoes to be measured on Wednesday and then coincidentally he will also be seeing vascular on Thursday. Overall I think this is good news and again I am hopeful that they will be able to do something for him to help prevent ongoing issues with edema control as well. No fevers, chills, nausea, vomiting, or diarrhea. 04/11/2019 on evaluation today patient actually appears to be doing poorly after just being discharged on Monday of this week. He had been experiencing issues with again blisters especially on the left lower extremity. With that being said he was completely healed and appeared to be doing great this past Monday. He then subsequently has new blisters that formed before his appointment with vascular this morning. He was also measured for shoes in the interim. With that being said we may have figured out what exactly is going on and why he continues to have issues like what we are seeing at this point. He takes his compression stockings off at nighttime and then he ends up having to sleep in his chair for 5-6 hours a night. He sleeps with his feet down he cannot really get him up in the recliner and therefore he is sleeping and the worst possible his position with his feet on the floor for that majority of the time. Again as I explained to him that is about one third at minimum at least one fourth of his day that he spending with his feet dangling down on the ground and the worst possible position they could be. I think this may be what is causing the issue. Subsequently I am leaning toward thinking that he may need a hospital bed in order to elevate his legs. We likely can have to coordinate this with his primary care provider at the [facility]. Readmission: 01/26/2021 this is a patient who  presents for repeat evaluation here in the clinic although it is actually been couple of years since have seen him in fact it was December 2020 when I last saw him. Subsequently he never really healed but did end up being lost to follow-up. He tells me has been having issues ongoing with his lower extremities has bilateral lower extremity lymphedema no real significant or definitive open wounds but in general his lymphedema is way out of control. We were never able to refer him to lymphedema clinic simply due to the fact to be honest we were never able to get him completely healed. I do not see anyone with open wounds. The patient does have evidence of type 2 diabetes mellitus, lymphedema, chronic venous insufficiency, and hypertension. That really has not changed since his last evaluation. 02/09/2021 upon evaluation today patient appears to be doing a little better in regard to his legs although he still having a tremendous amount of drainage especially on the left leg. Fortunately there does not appear to be any evidence of active  infection. Of note when we looked into this further it appears that the patient did not have any absorptive dressing on it was just the 4-layer compression wrap. Nonetheless this is probably big part of the issue here. 10/10; he comes in today with 3 large areas on the upper right lower leg likely remanence of denuded blistering under his compression wraps. He has no other wounds on the right. On the left he has the denuded area on the left medial foot and ankle and on the left dorsal foot. Massive lymphedema in both feet dorsally. Using Zetuvit under compression We have increased home health visitation to twice a week to change the dressings and will change it once 02/22/2021 upon evaluation today patient appears to be doing well currently with regard to his wounds. He has been tolerating the dressing changes without complication. Fortunately there does not appear to be  any evidence of active infection which is great news. No fevers, chills, nausea, vomiting, or diarrhea. The biggest issue I see currently is that home health is not putting any medicine on the actual wounds before wrapping. 03/01/2021 upon evaluation today the patient's right leg actually appears to be doing quite well which is great news there does not appear to be any evidence of active infection at this time. No fevers, chills, nausea, vomiting, or diarrhea. With that being said the patient is having issues on the left foot where he is having significant drainage is also an ammonia smell he does not have any animals at home and this makes me concerned about a bacteria producing urea as a byproduct. Again the possible common organisms will be E. coli, Proteus, and Enterococcus. All 3 of which can be successfully treated with Levaquin. For that reason I think that this may be a good option for [place] to consider placing him on and I did obtain a culture as well for confirmation sake. 03/08/2021 upon evaluation today patient appears to be doing unfortunately still somewhat poorly in regard to his leg ulcerations. He actually has an area on the right leg where he blistered due to the fact that his wrap slid down and caused an area of pinching on his skin and this has led to a significant issue here. 03/15/2021 upon evaluation today patient unfortunately has not been wrapped appropriately with absorptive dressings nor with the appropriate technique for the third layer of the 4-layer compression wrap. These are issues that we continue to try to address with the home health nurse. Also the absorptive dressing that she had was cut in half and therefore that causes things to leak out it does not actually trap the fluid in regard to the top of the foot overall I think that all these combined are really not seeing things improved significantly here. Fortunately there does not appear to be any signs of significant  infection at this time which is good news. He still is having a tremendous amount of drainage. 03/22/2021 upon evaluation today patient appears to be draining tremendously. He still continues to tell me that he is using his pumps 2 times a day and that coupled with that tells me that he is elevating his legs as well. With that being said all things considered I am really just not seeing the improvement we would expect to see with the 4-layer compression wrap and all the above noted. He in fact had an extremely large Zetuvit dressing on both legs and that they were extremely filled to the max with fluid.  This is after just being changed just before the weekend and this [name], [name] ([id number]) is Monday. Nonetheless I am concerned about the fact that there is something going on fairly significant that we cannot get any of this under control and that he is draining this significantly. He supposed be having an echocardiogram it sounds like scheduling has been an issue for him as far as getting in sooner. Its something to do with needing his cousin to drive him because of where it sat and he cannot drive himself to this appointment either way I really think he needs to try to see what he can do about making this happen a little sooner. He tells me he will call today. 03/29/2021 on evaluation today patient appears to be doing about the same in regard to his legs. He did get his cardiology appointment moved up to 6 December which is at least good that is better than what it was before mid December. Overall very pleased in that regard. 04/05/2021 upon evaluation today patient unfortunately is still doing fairly poorly. There does not appear to be any signs of active infection at this time. No fevers, chills, nausea, vomiting, or diarrhea. Unfortunately I think until his edema is under control and overall fluid overload there is really not to be much chance that I can do much to get him better. This is quite  unfortunate and frustrating both for myself and the patient to be perfectly honest. Nonetheless I think that he really needs to have a conversation both with his primary care provider as well as cardiologist he sees the PCP on Monday and cardiology on Wednesday of next week. 04/13/2021 upon evaluation today patient appears to be doing poorly in regard to his bilateral lower extremities his left is still worse than the right. With that being said he has a tremendous amount of drainage he did see his primary care provider yesterday there really was not much there to be done from their perspective. He sees cardiology tomorrow. Nonetheless my biggest concern here is simply that if we do not get the edema under control he is going to continue to have drainage and honestly I think at some point he is going to become infected severely that is my main concern. 04/19/2021 upon evaluation today patient appears to be doing poorly still in regard to his legs. Unfortunately there does not appear to be any signs of infection at this point. He does have a tremendous amount of drainage however. We have not seen the results back from the cardiologist and the echocardiogram that was done. It appears that the patient checked out okay as far as that is concerned with regard to ejection fraction though we still have some issues here to be honest with his diastolic function. I am unsure if this is accounting for everything that we are seeing or not. Either way he has a tremendous amount of drainage from his legs that we are just not able to control in the outpatient setting at this point. I have reached out to Dr. Rockey Situ his cardiologist to see once he reviews the sheet if there is anything that he feels like can be done from an outpatient perspective if not then I think the way to go is probably can to be through inpatient admission and diuresis. Otherwise I am not sure how working to get this under control we tried  antibiotics, compression wrapping, and I have told the patient to be elevating his legs I am  not sure how much he does of this but either way I think that this is still an ongoing issue nonetheless. 04/26/2021 upon evaluation today patient appears to be doing poorly in regard to his legs. He is having a tremendous amount of fluid at this point which is quite unfortunate. Its to the point that he may have had at least 5 to 10 pounds of fluid in his dressings this morning when they were removed these were changed this Friday. Subsequently I think he needs to go to the ER for further evaluation and treatment I think is probably can need diuresis possibly even IV antibiotics been on what the blood work looks like but in general I feel like he needs something to get this under control from an outpatient perspective absent of everywhere I can think of and I cannot get this under control with our traditional measures. I think this is going require more so that we can get him better 12/30; this is a patient with severe bilateral lymphedema. He was hospitalized from 04/26/2021 through 04/29/2021 treated for cellulitis in the setting of lower extremity ulcers and lymphedema. After he left the hospital he is apparently seen for nurse visit our staff contacted cardiology and he has been started on Lasix 40 mg. Apparently his legs have less edema. Lab work from 05/04/2021 showed a BUN of 38 and creatinine of 1.59 these are elevated versus previous where his creatinine seems to have been 1.30 on 12/19 his potassium is 4.3. I believe the lab work is being followed by cardiology We have him in a 4-layer wrap. Xeroform on the leg wounds and sit to fit on the Berry damage skin on the left dorsal foot versus right dorsal foot. He has compression pumps but does not use them. We have apparently not yet ordered him compression stockings 05/17/2021 upon evaluation today patient's legs though better than last time I personally  saw him appear to be getting worse compared to where they were previous. Dr. Quentin Cornwall was actually last 1 to see you I have not seen him since 19 December. That was before he went into the ER. Coming out apparently his legs looked also and they still look better but not as good as they were in the past. 1/16; patient with severe bilateral lymphedema. Severe scaled hyperkeratotic skin on the dorsal aspect of his distal left foot and left medial ankle.. On the right side changes are not as bad. He did not have any weeping edema. Our intake nurse was convinced that he is being compliant with compression pumps 1 hour twice a day 05/31/2021 upon evaluation today patient actually appears to be doing a little bit better in my opinion in regard to his feet. I do not see as much drainage and it being just completely wet as it was previous. Fortunately I do not also see any signs of active infection which is great news as well. 06/07/21 Upon inspection patient's wound bed actually showed signs of doing well he is not nearly as weepy and wet as he has been in the past and overall very pleased in that regard. Fortunately I do not see any signs of active infection locally or nor systemically at this time. Which is great news. No fevers, chills, nausea, vomiting, or diarrhea. 06/14/2021 upon evaluation today patient appears to be doing well with regard to his right foot I am pleased in that regard. His left foot is still draining quite a bit despite using lymphedema pumps, 4-layer  compression wraps, and he tells me elevating his legs as well. He also has Lasix that he takes twice a day. Nonetheless I believe that this is still good to be an ongoing issue. We have a hard time getting this under control as far as the swelling is concerned. 06/21/2021 upon evaluation today patient appears to be doing decently well in regard to his wounds all things considered. He still has a tremendous amount of drainage and fluid noted  at this point. Fortunately I do not see any signs of active infection locally or systemically at this point which is great news. Nonetheless I am unsure where to go and how to do this as far as trying to limit his swelling and weeping from his toes in particular. 06/28/2021 upon evaluation patient unfortunately continues to have significant drainage from his feet. We have been keeping him in a compression wrap and despite this he still continues to have extreme fluid issues he seen his cardiologist he is seeing the nephrologist. We really cannot find any way to get this under control when he did well was when he was in the hospital and they got some of the fluid away. But outside of that we are just struggling to achieve the long-term goal of getting this under control and keeping it under control unfortunately. 07/05/2021 upon evaluation today patient appears to be doing poorly in regard to his feet. Unfortunately this continues to be a significant issue and to be honest I am really not certain what to do about it. I referred him to Dr. Randol Kern at [facility] and he does have an appointment although it is [name], [name] ([id number]) 12 April. He also sees his primary care provider on 6 April. He did not want to see Dr. Haynes Kerns until after he saw his PCP that is the reason the appointment so far out. There is really not much I can do in that regard. Nonetheless I do think that we are still continue to have significant lymphedema issues with significant mount of weeping in regard to the feet and again this has just become extremely difficult to manage to be honest I am not sure if there is something else that Dr. Haynes Kerns or someone else could recommend he also will be seeing Dr. Dellia Nims in 2 weeks when I am on vacation and at that time I will see if Dr. Dellia Nims has any ideas about where to go from here in the meantime. 07/12/2021 upon evaluation today patient appears to be doing about as well as can be expected with  regard to his feet. He does actually see his kidney doctor this Friday. He also will be seeing his primary care provider on April 4 and then following that around mid April he will be seeing Dr. Haynes Kerns at [facility] which was a referral made for him. Again my goal is to try to find out some way to fix this and to be perfectly honest we have had some issues with making any good adjustments. When he was in the hospital and greater amounts of Lasix he was able to get this down and it looked much better upon discharge. With that being said right now things just are not doing nearly as good as what they used to be. 3/13; patient presents for follow-up. He has been using his lymphedema pumps over the past week. He reports an increase in his Lasix dose. He has no issues or complaints today. He denies signs of infection. 07/26/2021 upon evaluation today  patient appears to be doing better in regard to his feet bilaterally. Both are showing signs of much less drainage which is great news and overall very pleased in that regard. Fortunately there does not appear to be any evidence of active infection locally or systemically at this time. No fevers, chills, nausea, vomiting, or diarrhea. 08/02/2021 upon evaluation today patient appears to be doing well with regard to his feet. Both are showing signs of being drier the right pretty much has not really draining much at all which is great news. The left is not draining anywhere close to his much as it was during the last evaluation. This is excellent news and overall very pleased. 08/09/2021 upon evaluation today patient appears to be doing well with regard to his legs the right leg especially showing signs of excellent improvement which is great news I do not see any evidence of active infection locally or systemically which is great. In regard to the left leg he still has some weeping and drainage but nothing as significant as what it was in the past this is great  news. 08-16-2021 upon evaluation today patient appears to actually be doing quite well in my opinion in regard to his feet. This is significantly improved compared to what we previously seen and overall I am extremely pleased in that regard. I do believe that He is actually improving although this is obviously very slow going. 08-23-2021 upon evaluation today patient appears to be doing well currently in regard to his right leg which actually is pretty dry at this point today. Fortunately I do not see any evidence of active infection at this time which is great news. No fevers, chills, nausea, vomiting, or diarrhea. 08-30-2021 upon evaluation today patient appears to be doing well with regard to his lower extremities. The right foot is pretty much completely dry which is great news the left foot though not completely dry seems to be doing decently well. I do not see any signs of active infection locally or systemically which is great news. No fevers, chills, nausea, vomiting, or diarrhea. 09-06-2021 upon evaluation today patient appears to be doing well with regard to his feet in fact now the left foot is almost completely dry as well and I am definitely seeing a lot of significant improvement. With that being said unfortunately he actually appears to have some cellulitis of his right thigh. His toes are also little bit red but this may just be due to the increased swelling. He really is not warm to touch in regard to the toes. 5/8; excellent edema control on the right foot and lower leg there is no open wounds but we continue to put compression on this otherwise this will breakdown. He is using his compression pumps twice a day at home The area that is problematic is on the left dorsal foot some areas that are not fully epithelialized with very dry fissured skin over this area. 09-20-2021 upon evaluation today patient appears to be doing a little bit worse in regard to swelling at this time. Fortunately I  do not see any signs of infection with that being said the wrap was not on quite as well as what I would like to have seen. I do believe that this has caused a little bit of excess swelling and again we need to try to get this under good control. 09-27-2021 upon evaluation today patient appears to be doing awesome in regard to his feet and legs. Everything is measuring  smaller the swelling is down and to be perfectly honest I am extremely pleased with the end of his feet especially on the left side and how dry this is today. I do think we are on the right track here. 5/31; patient presents for follow-up. He is using nystatin powder to the feet bilaterally under 4-layer compression. He has no issues or complaints today. He states he is going to the lymphedema clinic tomorrow. Since he has no open wounds on the right lower extremity they will be focused on the side. 10-11-2021 upon evaluation today patient appears to be doing excellent in regard to his legs. He has been tolerating the dressing changes without complication. Fortunately there does not appear to be any evidence of active infection locally or systemically at this time which is great news. No fevers, chills, nausea, vomiting, or diarrhea. Objective Constitutional Well-nourished and well-hydrated in no acute distress. Vitals Time Taken: 11:12 AM, Weight: 312 lbs, Temperature: 97.7 F, Pulse: 57 bpm, Respiratory Rate: 18 breaths/min, Blood Pressure: 123/67 mmHg. [address], [name] ([id number]) Respiratory normal breathing without difficulty. Psychiatric this patient is able to make decisions and demonstrates good insight into disease process. Alert and Oriented x 3. pleasant and cooperative. General Notes: Upon inspection patient's wound bed showed signs of excellent improvement. I am very pleased with where we stand and I think that well on the right track year. He does have an appointment with lymphedema clinic tomorrow. 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 Would recommend going to continue with the wound care measures as before and the patient is in agreement with plan. This includes the use of the bilateral 4 layer compression wraps. 2. I am also going to recommend that we continue with the 2 x 2 gauze between his toes as well as the Zetuvit's on the left side. 3. We will just use an ABD pads on the right side and he will be seeing the lymphedema clinic tomorrow starting on treatment for the right side. We will see patient back for reevaluation in 1 week here in the clinic. If anything worsens or changes patient will contact our office for additional recommendations. Electronic Signature(s) Signed: 10/11/2021 11:35:14 AM By: Worthy Keeler PA-C Entered By: Worthy Keeler on 10/11/2021 11:35:14 [name] ([id number]) -------------------------------------------------------------------------------- SuperBill Details Patient Name: [name] Date of Service: 10/11/2021 Medical Record Number: [id number] Patient Account Number: [id number] Date of Birth/Sex: 19-Mar-1951 (71 y.o. M) Treating RN: [name] Primary Care Provider: Fontaine No Other Clinician: Referring Provider: Fontaine No Treating Provider/Extender: Skipper Cliche in Treatment: 36 Diagnosis Coding ICD-10 Codes Code Description E11.622 Type 2 diabetes mellitus with other skin ulcer I89.0 Lymphedema, not elsewhere classified I87.2 Venous insufficiency (chronic) (peripheral) L97.822 Non-pressure chronic ulcer of other part of  left lower leg with fat layer exposed L97.512 Non-pressure chronic ulcer of other part of right foot with fat layer exposed I10 Essential (primary) hypertension L97.811 Non-pressure chronic ulcer of other part of right lower leg limited to breakdown of skin Facility Procedures CPT4: Description Modifier Quantity Code [id number] BILATERAL: Application of multi-layer venous compression system; leg (below knee), including 1 ankle and foot. Physician  Procedures CPT4 Code: 1694503 Description: 88828 - WC PHYS LEVEL 3 - EST PT Modifier: Quantity: 1 CPT4 Code: Description: ICD-10 Diagnosis Description E11.622 Type 2 diabetes mellitus with other skin ulcer I89.0 Lymphedema, not elsewhere classified I87.2 Venous insufficiency (chronic) (peripheral) L97.822 Non-pressure chronic ulcer of other part of left lower  leg with fat layer Modifier: exposed Quantity: Electronic Signature(s) Signed: 10/11/2021 4:19:54 PM By: Worthy Keeler PA-C Signed: 10/11/2021 5:11:25 PM By: [name] RN Previous Signature: 10/11/2021 11:35:37 AM Version By: Worthy Keeler PA-C Entered By: [name] on 10/11/2021 12:37:56

## 2021-10-11 NOTE — Progress Notes (Addendum)
RAPHEAL, MASSO ([id number]) Visit Report for 10/11/2021 Arrival Information Details Patient Name: [name], [name] Date of Service: 10/11/2021 11:00 AM Medical Record Number: [id number] Patient Account Number: [id number] Date of Birth/Sex: 1950/09/29 (71 y.o. M) Treating RN: [name] Primary Care Linzie Criss: Fontaine No Other Clinician: Referring Athen Riel: Fontaine No Treating Naoko Diperna/Extender: Skipper Cliche in Treatment: 36 Visit Information History Since Last Visit All ordered tests and consults were completed: No Patient Arrived: [name] Added or deleted any medications: No Arrival Time: 11:12 Any new allergies or adverse reactions: No Accompanied By: self Had a fall or experienced change in No Transfer Assistance: None activities of daily living that may affect Patient Identification Verified: Yes risk of falls: Secondary Verification Process Completed: Yes Signs or symptoms of abuse/neglect since last visito No Patient Requires Transmission-Based No Hospitalized since last visit: No Precautions: Implantable device outside of the clinic excluding No Patient Has Alerts: Yes cellular tissue based products placed in the center Patient Alerts: AVVS consult on file since last visit: Last ABI-R 1.09; L Has Dressing in Place as Prescribed: Yes 1.04 Has Compression in Place as Prescribed: Yes Pain Present Now: No Electronic Signature(s) Signed: 10/11/2021 5:11:25 PM By: [name] RN Entered By: [name] on 10/11/2021 11:12:53 [name] ([id number]) -------------------------------------------------------------------------------- Clinic Level of Care Assessment Details Patient Name: [name] Date of Service: 10/11/2021 11:00 AM Medical Record Number: [id number] Patient Account Number: [id number] Date of Birth/Sex: 11/17/1950 (71 y.o. M) Treating RN: [name] Primary Care Brenlee Koskela: Fontaine No Other Clinician: Referring Boomer Winders: Fontaine No Treating Celsa Nordahl/Extender: Skipper Cliche in Treatment: 36 Clinic Level of Care Assessment Items TOOL 1 Quantity Score '[]'$  - Use when EandM and Procedure is performed on INITIAL visit 0 ASSESSMENTS - Nursing Assessment / Reassessment '[]'$  - General Physical Exam (combine w/ comprehensive assessment (listed just below) when performed on new 0 pt. evals) '[]'$  - 0 Comprehensive Assessment (HX, ROS, Risk Assessments, Wounds Hx, etc.) ASSESSMENTS - Wound and Skin Assessment / Reassessment '[]'$  - Dermatologic / Skin Assessment (not related to wound area) 0 ASSESSMENTS - Ostomy and/or Continence Assessment and Care '[]'$  - Incontinence Assessment and Management 0 '[]'$  - 0 Ostomy Care Assessment and Management (repouching, etc.) PROCESS - Coordination of Care '[]'$  - Simple Patient / Family Education for ongoing care 0 '[]'$  - 0 Complex (extensive) Patient / Family Education for ongoing care '[]'$  - 0 Staff obtains Programmer, systems, Records, Test Results / Process Orders '[]'$  - 0 Staff telephones HHA, Nursing Homes / Clarify orders / etc '[]'$  - 0 Routine Transfer to another Facility (non-emergent condition) '[]'$  - 0 Routine Hospital Admission (non-emergent condition) '[]'$  - 0 New Admissions / Biomedical engineer / Ordering NPWT, Apligraf, etc. '[]'$  - 0 Emergency Hospital Admission (emergent condition) PROCESS - Special Needs '[]'$  - Pediatric / Minor Patient Management 0 '[]'$  - 0 Isolation Patient Management '[]'$  - 0 Hearing / Language / Visual special needs '[]'$  - 0 Assessment of Community assistance (transportation, D/C planning, etc.) '[]'$  - 0 Additional assistance / Altered mentation '[]'$  - 0 Support Surface(s) Assessment (bed, cushion, seat, etc.) INTERVENTIONS - Miscellaneous '[]'$  - External ear exam 0 '[]'$  - 0 Patient Transfer (multiple staff / Civil Service fast streamer / Similar devices) '[]'$  - 0 Simple Staple / Suture removal (25 or less) '[]'$  - 0 Complex Staple / Suture removal (26 or more) '[]'$  -  0 Hypo/Hyperglycemic Management (do not check if billed separately) '[]'$  - 0 Ankle / Brachial Index (ABI) - do not check if billed separately Has the patient been  seen at the hospital within the last three years: Yes Total Score: 0 Level Of Care: ____ [name] ([id number]) Electronic Signature(s) Signed: 10/11/2021 5:11:25 PM By: [name] RN Entered By: [name] on 10/11/2021 12:37:46 [name] ([id number]) -------------------------------------------------------------------------------- Compression Therapy Details Patient Name: [name] Date of Service: 10/11/2021 11:00 AM Medical Record Number: [id number] Patient Account Number: [id number] Date of Birth/Sex: 1950-10-08 (71 y.o. M) Treating RN: [name] Primary Care Janeen Watson: Fontaine No Other Clinician: Referring Perez Dirico: Fontaine No Treating Emersyn Wyss/Extender: Skipper Cliche in Treatment: 36 Compression Therapy Performed for Wound Assessment: NonWound Condition Lymphedema - Left Leg Performed By: Clinician [name], RN Compression Type: Four Layer Post Procedure Diagnosis Same as Pre-procedure Electronic Signature(s) Signed: 10/11/2021 5:11:25 PM By: [name] RN Entered By: [name] on 10/11/2021 12:37:27 [name] ([id number]) -------------------------------------------------------------------------------- Compression Therapy Details Patient Name: [name] Date of Service: 10/11/2021 11:00 AM Medical Record Number: [id number] Patient Account Number: [id number] Date of Birth/Sex: 1950-11-22 (71 y.o. M) Treating RN: [name] Primary Care Aloura Matsuoka: Fontaine No Other Clinician: Referring Angelynn Lemus: Fontaine No Treating Celise Bazar/Extender: Skipper Cliche in Treatment: 36 Compression Therapy Performed for Wound Assessment: NonWound Condition Lymphedema - Right Leg Performed By: Clinician [name], RN Compression Type: Four Layer Post Procedure  Diagnosis Same as Pre-procedure Electronic Signature(s) Signed: 10/11/2021 5:11:25 PM By: [name] RN Entered By: [name] on 10/11/2021 12:37:39 [name] ([id number]) -------------------------------------------------------------------------------- Encounter Discharge Information Details Patient Name: [name] Date of Service: 10/11/2021 11:00 AM Medical Record Number: [id number] Patient Account Number: [id number] Date of Birth/Sex: 1950/10/04 (71 y.o. M) Treating RN: [name] Primary Care Chasta Deshpande: Fontaine No Other Clinician: Referring Hermon Zea: Fontaine No Treating Eduar Kumpf/Extender: Skipper Cliche in Treatment: 33 Encounter Discharge Information Items Discharge Condition: Stable Ambulatory Status: Cane Discharge Destination: Home Transportation: Private Auto Accompanied By: self Schedule Follow-up Appointment: Yes Clinical Summary of Care: Patient Declined Electronic Signature(s) Signed: 10/11/2021 5:11:25 PM By: [name] RN Entered By: [name] on 10/11/2021 12:38:34 [name] ([id number]) -------------------------------------------------------------------------------- Lower Extremity Assessment Details Patient Name: [name] Date of Service: 10/11/2021 11:00 AM Medical Record Number: [id number] Patient Account Number: [id number] Date of Birth/Sex: 01-01-1951 (71 y.o. M) Treating RN: [name] Primary Care Maryann Mccall: Fontaine No Other Clinician: Referring Tallan Sandoz: Fontaine No Treating Mikael Skoda/Extender: Skipper Cliche in Treatment: 36 Edema Assessment Assessed: [Left: No] [Right: No] [Left: Edema] [Right: :] Calf Left: Right: Point of Measurement: From Medial Instep 37 cm 36 cm Ankle Left: Right: Point of Measurement: From Medial Instep 26 cm 24 cm Vascular Assessment Pulses: Dorsalis Pedis Palpable: [Left:Yes] [Right:Yes] Notes left 31 right 24 Electronic Signature(s) Signed: 10/11/2021  5:11:25 PM By: [name] RN Entered By: [name] on 10/11/2021 11:27:17 [name] ([id number]) -------------------------------------------------------------------------------- Multi Wound Chart Details Patient Name: [name] Date of Service: 10/11/2021 11:00 AM Medical Record Number: [id number] Patient Account Number: [id number] Date of Birth/Sex: [birth year] (71 y.o. M) Treating RN: [name] Primary Care Cailen Texeira: Fontaine No Other Clinician: Referring Caylie Sandquist: Fontaine No Treating Dreyson Mishkin/Extender: Skipper Cliche in Treatment: 36 Vital Signs Height(in): Pulse(bpm): 57 Weight(lbs): 312 Blood Pressure(mmHg): 123/67 Body Mass Index(BMI): Temperature(F): 97.7 Respiratory Rate(breaths/min): 18 Wound Assessments Treatment Notes Electronic Signature(s) Signed: 10/11/2021 5:11:25 PM By: [name] RN Entered By: [name] on 10/11/2021 11:27:32 [name] ([id number]) -------------------------------------------------------------------------------- [place] Details Patient Name: [name] Date of Service: 10/11/2021 11:00 AM Medical Record Number: [id number] Patient Account Number: [id number] Date of Birth/Sex: 07-19-1950 (71 y.o. M) Treating RN: [name] Primary Care Trystin Terhune: Sharlett Iles,  [name] Other Clinician: Referring Tymar Polyak: Fontaine No Treating Chasty Randal/Extender: Skipper Cliche in Treatment: 36 Active Inactive Soft Tissue Infection Nursing Diagnoses: Impaired tissue integrity Knowledge deficit related to disease process and management Knowledge deficit related to home infection control: handwashing, handling of soiled dressings, supply storage Potential for infection: soft tissue Goals: Patient/caregiver will verbalize understanding of or measures to prevent infection and contamination in the home setting Date Initiated: 09/06/2021 Target Resolution Date: 09/06/2021 Goal Status:  Active Patient's soft tissue infection will resolve Date Initiated: 09/06/2021 Target Resolution Date: 09/06/2021 Goal Status: Active Signs and symptoms of infection will be recognized early to allow for prompt treatment Date Initiated: 09/06/2021 Target Resolution Date: 09/06/2021 Goal Status: Active Interventions: Assess signs and symptoms of infection every visit Provide education on infection Treatment Activities: Education provided on Infection : 10/06/2021 Systemic antibiotics : 09/06/2021 Notes: Electronic Signature(s) Signed: 10/11/2021 5:11:25 PM By: [name] RN Entered By: [name] on 10/11/2021 11:27:25 [name] ([id number]) -------------------------------------------------------------------------------- Pain Assessment Details Patient Name: [name] Date of Service: 10/11/2021 11:00 AM Medical Record Number: [id number] Patient Account Number: [id number] Date of Birth/Sex: [birth year] (71 y.o. M) Treating RN: [name] Primary Care Tyquavious Gamel: Fontaine No Other Clinician: Referring Jaydy Fitzhenry: Fontaine No Treating Mordecai Tindol/Extender: Skipper Cliche in Treatment: 36 Active Problems Location of Pain Severity and Description of Pain Patient Has Paino No Site Locations Pain Management and Medication Current Pain Management: Electronic Signature(s) Signed: 10/11/2021 5:11:25 PM By: [name] RN Entered By: [name] on 10/11/2021 11:13:22 [name] ([id number]) -------------------------------------------------------------------------------- Patient/Caregiver Education Details Patient Name: [name] Date of Service: 10/11/2021 11:00 AM Medical Record Number: [id number] Patient Account Number: [id number] Date of Birth/Gender: 15-Dec-1950 (71 y.o. M) Treating RN: [name] Primary Care Physician: Fontaine No Other Clinician: Referring Physician: Fontaine No Treating Physician/Extender: Skipper Cliche in Treatment:  53 Education Assessment Education Provided To: Patient Education Topics Provided Infection: Methods: Explain/Verbal Responses: State content correctly Electronic Signature(s) Signed: 10/11/2021 5:11:25 PM By: [name] RN Entered By: [name] on 10/11/2021 12:38:05 [name] ([id number]) -------------------------------------------------------------------------------- Vitals Details Patient Name: [name] Date of Service: 10/11/2021 11:00 AM Medical Record Number: [id number] Patient Account Number: [id number] Date of Birth/Sex: 03-24-1951 (71 y.o. M) Treating RN: [name] Primary Care Jaqualyn Juday: Fontaine No Other Clinician: Referring Josslynn Mentzer: Fontaine No Treating Ceejay Kegley/Extender: Skipper Cliche in Treatment: 36 Vital Signs Time Taken: 11:12 Temperature (F): 97.7 Weight (lbs): 312 Pulse (bpm): 57 Respiratory Rate (breaths/min): 18 Blood Pressure (mmHg): 123/67 Reference Range: 80 - 120 mg / dl Electronic Signature(s) Signed: 10/11/2021 5:11:25 PM By: [name] RN Entered By: [name] on 10/11/2021 11:13:16

## 2021-10-12 ENCOUNTER — Ambulatory Visit: Payer: PPO | Admitting: Occupational Therapy

## 2021-10-14 ENCOUNTER — Ambulatory Visit: Payer: PPO | Attending: Physician Assistant | Admitting: Occupational Therapy

## 2021-10-14 ENCOUNTER — Encounter: Payer: Self-pay | Admitting: Occupational Therapy

## 2021-10-14 DIAGNOSIS — I890 Lymphedema, not elsewhere classified: Secondary | ICD-10-CM | POA: Insufficient documentation

## 2021-10-15 NOTE — Therapy (Signed)
[place] MAIN [facility] SERVICES [address] [place], [place], [address] Phone: [phone]   Fax:  [phone]  Occupational Therapy Evaluation: Lymphedema Care  Patient Details  Name: [name] MRN: [id number] Date of Birth: [birth year] Referring Provider (OT): Jeri Cos, [place]   Encounter Date: 10/14/2021   OT End of Session - 10/14/21 0953     Visit Number 1    Number of Visits 36    Date for OT Re-Evaluation 01/12/22    OT Start Time 0805    OT Stop Time 0950    OT Time Calculation (min) 105 min    Activity Tolerance Patient tolerated treatment well;No increased pain    Behavior During Therapy WFL for tasks assessed/performed             Past Medical History:  Diagnosis Date   Diabetes mellitus without complication ([place])    Hyperlipidemia    Hypertension    Leukemia ([place])     Past Surgical History:  Procedure Laterality Date   CATARACT EXTRACTION Right    TONSILLECTOMY     TOOTH EXTRACTION      There were no vitals filed for this visit.   Subjective Assessment - 10/14/21 0819     Subjective  [name] is a 71 y o male referred to Occupational Therapy for evaluation and treatment of BLE lymphedema by Jeri Cos, PA-C. Pt is transported in a manual wc and is accompanied by his counsin, Baker Janus. [name] reports onset of leg swelling, associated pain and chronic wounds below the knees bilaterally about 5 years ago after blisters developed on his skin. He reprots he was recently hospitalized for leg infection.(cellulitis?). Pt denies prior lymphedema therapy, and states he usnable to don and doff elastic compression stockings. He has undergone wound care for quite some time, and has had home health in the past  for wrapping his legs. Pt's goals for therapy are, "to get my legs better so I can wear shoes like everyone else".    Patient is accompanied by: Family member   cousin, Baker Janus, drove Pt to clinic and attended appt    Pertinent History Relevant to lymphedema: No presence of varicose veins or arterial sufficiency, and DVT and ruled out by Ronita Hipps, MD at [facility]. Pt declined venogram offered by Dr. Haynes Kerns. . Pt has a basic sequential pneumatic compression device, or "pump" , set at 40 mmHg, uses 2 x daily by report. Marland Kitchen PMH significant for leukemia, HTN, PVD, CAD and CHF. Pt has DM with neuropathy. CVI CKD, Sage 3a. Tobacco abuse disorder- quit 2010.    Limitations Impaired sensation ; increased infection risk, increased falls risk, iimpaired dynamic balance, difficulty walking, non-healing leg wounds, chronic, progressive , BLE lymphedema, impaired hip, knee and ankle AROM; impaired basic and instrumental ADLs ( bathing, grooming, dressing, skin inspection, shopping, home management, sleeping, driving, functional ambulation and transfers), impaired work and productive activities, impaired leisure pursuits (no longer able to gather with friends to play music; sedentary lifestyle) impaired social participation    Special Tests + Stemmer sign bilaterally, Intake FOTO 56/100%    Currently in Pain? Yes    Pain Score 5     Pain Location Leg    Pain Orientation Right;Left    Pain Descriptors / Indicators Discomfort;Heaviness;Tightness    Pain Type Chronic pain    Pain Onset --   leg pain onset s/p 5 years w onset of swelling   Aggravating Factors  standing, walking,  dependent sitting    Pain Relieving Factors elevation    Effect of Pain on Daily Activities chronic BLE wounds, swelling and associated pain limits functional performance in all occupational domains. See LIMITATIONS above               [facility] OT Assessment - 10/14/21 0838       Assessment   Medical Diagnosis severe, stage II, BLE lymphedema 2/2 CVI, PVD    Referring Provider (OT) Jeri Cos, PA-C    Onset Date/Surgical Date 04/08/17    Hand Dominance Right    Prior Therapy No prior CDT; Off the shelf 20-30 he thinks. Knee high       Precautions   Precaution Comments Lymphedema Precautions: DM, CHF + CKD      Balance Screen   Has the patient fallen in the past 6 months No    Has the patient had a decrease in activity level because of a fear of falling?  Yes      Home  Environment   Family/patient expects to be discharged to: Private residence    Type of [place]   1 step out front door. Has scooter coming from [place]. 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      IADL   Prior Level of [place] for transportation;Needs to be accompanied on any shopping trip    Prior Level of Function Light Housekeeping I    Light Housekeeping Needs help with all home maintenance tasks    Prior Level of Function Meal Prep I    Meal Prep Able to complete simple cold meal and snack prep    Prior Level of Function Community Mobility I    Community Mobility --   drives close to home, otherwise depends on friends/ family     Mobility   Mobility Status Needs assist      Vision - History   Baseline Vision Wears glasses all the time      Observation/Other Assessments   Focus on Therapeutic Outcomes (FOTO)  56/100%           Severe, Stage II, BLE Lymphedema 2/2 CVI, PVD,  in the context of CHF, CKD, HTN   Skin  Description Hyper-Keratosis Peau' de Orange Shiny Tight Fibrotic/ Indurated Fatty Doughy Spongy/ boggy   x   x x   woody   Skin dry Flaky WNL Macerated   x x     Color Redness Present Varicosities Blanching Hemosiderin Staining Mottled   Unable to assess        Odor Malodorous Yeast Fungal infection  Absent   Unable to assess      Temperature Warm Cool wnl    Unable to assess     Pitting  Edema   1+ 2+ 3+ 4+ Non-pitting         Unable to assess   Girth Symmetrical Asymmetrical                   Distribution     L>R  Ankles to distal thighs  Stemmer Sign Positive Negative   strong    Lymphorrhea History Of:  Present Absent   x Unable to assess     Wounds History Of Present Absent Venous Arterial Pressure Mechanical   x Unable to assess         Signs of Infection Redness Warmth Erythema Acute Swelling Drainage B[place]   Unable to assess                 Sensation Light Touch Deep pressure Hypersensitivty   Present Impaired Present Impaired Absent Impaired    x        Nails WNL   Fungus nail dystrophy   Unable to assess     Hair Growth Symmetrical Asymmetrical       Skin Creases Base of toes  Ankles   Base of Fingers knees       Abdominal pannus Thigh Lobules  Face/neck   Unable to assess                           OT Education - 10/14/21 0820     Education Details Provided Pt and family education regarding lymphatic structure and function, etiology, onset patterns and stages of progression. Taught interaction between blood circulatory system and lymphatic circulation.Discussed  impact of gravity and co-morbidities on lymphatic function. Outlined Complete Decongestive Therapy (CDT)  as standard of care and provided in depth information regarding 4 primary components of Intensive and Self Management Phases, including Manual Lymph Drainage (MLD), compression wrapping and garments, skin care, and therapeutic exercise. Pilar Plate discussion with re need for frequent attendance and high burden of care when caregiver is needed, impact of co morbidities. We discussed  the chronic, progressive nature of lymphedema and Importance of daily, ongoing LE self-care essential for limiting progression and infection risk. Lymphedema management has a high burden of care, especially for Patients who have difficulty, or who are unable to reach their  distal legs and feet.  reach their feet and distal legs to aapply bandages, compression garments, to bathe , inspect skin and groom nails. In this case because Pt is unable to perform these activities, daily caregiver assistance with the lymphedema self-care home program  between OT visits is essential for achieving clinical success. Without ongoing, daily caregiver assistance during compression bandaging, donning and doffing compression garments, skin care, and MLD , Pt's prognosis is poor. However, with consistent caregiver assistance with lymphedema home program prognosis for improved lymphedema control and reducing infection risk and progression is good.    Person(s) Educated Patient;Other (comment)    Methods Explanation;Demonstration    Comprehension Verbalized understanding;Returned demonstration;Need further instruction                 OT Long Term Goals - 10/15/21 1228       OT LONG TERM GOAL #1   Title Given this patient's Intake score of 56/100% on the functional outcomes FOTO tool, patient will experience an increase in function of 3 points to 59, to improve basic and instrumental ADLs performance, including lymphedema self-care.    Baseline Max A    Time 12    Period Weeks    Status New    Target Date 01/13/22      OT LONG TERM GOAL #2   Title Pt will demonstrate understanding of lymphedema precautions and prevention strategies with modified independence using a printed reference to identify at least 5 precautions and discussing how s/he may implement them into  daily life to reduce risk of progression and to limit infection risk.    Baseline Max A    Time 4    Period Days    Status New    Target Date --   4th OT Rx visit     OT LONG TERM GOAL #3   Title With Maximum caregiver assistance Pt will be able to apply multilayer, knee length, compression wraps using gradient techniques from base of toes to groin to decrease limb volume, to limit infection risk, and to limit  lymphedema progression. Pt requires Max CG assist to achieve this goal b/c he is unable to reach his feet and toes.    Baseline Dependent    Time 12    Period Weeks    Status New    Target Date 01/13/22      OT LONG TERM GOAL #4   Title Pt will achieve at least a 10% limb volume reduction in BLE from ankle to groin to return limb to more normal size and shape, to limit infection risk, to decrease pain, to improve AROM and function, and to limit lymphedema progression.    Baseline dependent    Time 12    Period Weeks    Status New    Target Date 01/13/22      OT LONG TERM GOAL #5   Title Max caregiver assistance is essential in this case to complete Intensive and Self-management Phases of Complete Decongestive Therapy, including the Lymphedema self-care home program b/c Pt is unable to reach his feet to apply compression wraps and garments, to perform skin  and nail care and inspection and to perform simple self-MLD. Pt agrees to arrange for consistent daily caregiver prior to commencing CDT.    Baseline dependent    Time 1    Period Days    Status New    Target Date --   initial Rx appointment     Long Term Additional Goals   Additional Long Term Goals Yes      OT LONG TERM GOAL #6   Title With Max caregiver assistance Pt will achieve and sustain no less than 85% compliance with all LE self-care home program components throughout CDT, including modified simple self-MLD, daily skin care and inspection, lymphatic pumping the ex and appropriate compression to limit lymphedema progression and to limit further functional decline.    Baseline dependent    Time 12    Period Weeks    Status New    Target Date 01/13/22                   Plan - 10/15/21 1224     Clinical Impression Statement [name] is a 68 y o male presenting with severe, stage II , BLE lymphedema , L>R, 2/2 PVD and CVI. Pt also presents with several complex medical conditions resulting in systemic fluid  overload, which in tern adds significant stress to already overlaoded lymphatic structures. It is impossible to fully evaluate skin conditions today as Pt presents to clinic with Profore leg bandages in place blaterally below the knees for non-healing leg wounds. He does not bring wound care wraps to reapply, and the lymphedema clinic does not issue short stretch bandages at evaluations. Pt reports wounds are closed on the R, but drainage persists on the L. We are able to observe what appears to be severe stasis dermatitis. The dorsum of the L foot is massively swollen, while both ankles appear typical in size. BLE  leg swelling, ongoing, fluctuating lmphorrhea, chronic leg wounds and associated pain limit patient's functional ambulation and mobility, and functional performance in all occupational domains, including basic and instrumental ADLs, productive activities, leisure pursuits and social participation. Pt will benefit from skilled Occupational Therapy for Complete Decongestive Therapy to limit lymphedema progression, and to limit further functional decline. Lymphedema management has a high burden of care, especially for Patients who have difficulty, or who are unable to reach their distal legs and feet.  reach their feet and distal legs to aapply bandages, compression garments, to bathe , inspect skin and groom nails. In this case because Pt is unable to perform these activities, daily caregiver assistance with the lymphedema self-care home program  between OT visits is essential for achieving clinical success. Without ongoing, daily caregiver assistance during compression bandaging, donning and doffing compression garments, skin care, and MLD , Pt's prognosis is poor. However, with consistent caregiver assistance with lymphedema home program prognosis for improved lymphedema control and reducing infection risk and progression is good.    OT Occupational Profile and History Detailed Assessment- Review of  Records and additional review of physical, cognitive, psychosocial history related to current functional performance    Occupational performance deficits (Please refer to evaluation for details): ADL's;IADL's;Work;Leisure;Social Participation    Body Structure / Function / Physical Skills ADL;Edema;Skin integrity;Pain;IADL;Sensation    Rehab Potential Fair    Clinical Decision Making Multiple treatment options, significant modification of task necessary    Comorbidities Affecting Occupational Performance: Presence of comorbidities impacting occupational performance    Comorbidities impacting occupational performance description: see SUBJECTIVE    Modification or Assistance to Complete Evaluation  Min-Moderate modification of tasks or assist with assess necessary to complete eval    OT Frequency 2x / week    OT Duration 12 weeks   and PRN to complete BLE Intensive Phase CDT and fit with appropriate compression   OT Treatment/Interventions Self-care/ADL training;Therapeutic exercise;Coping strategies training;Therapeutic activities;Manual lymph drainage;Manual Therapy;Other (comment);Compression bandaging;DME and/or AE instruction;Patient/family education    Plan Intensive and Self-Management Phase of Complete Decongestive Therapy (CDT) to include MLD, skin care, ther ex and compression. Lymphedema management has a high burden of care, especially for Patients who have difficulty, or who are unable to reach their distal legs and feet.  reach their feet and distal legs to aapply bandages, compression garments, to bathe , inspect skin and groom nails. In this case because Pt is unable to perform these activities, daily caregiver assistance with the lymphedema self-care home program  between OT visits is essential for achieving clinical success. Without ongoing, daily caregiver assistance during compression bandaging, donning and doffing compression garments, skin care, and MLD , Pt's prognosis is poor.  However, with consistent caregiver assistance with lymphedema home program prognosis for improved lymphedema control and reducing infection risk and progression is good.    OT Home Exercise Plan Fit with appropriate compression garments for full-time , daily use. Considerations: Pt unable to reach feet to don/ doff standard, elastic garments    Consulted and Agree with Plan of Care Patient             Patient will benefit from skilled therapeutic intervention in order to improve the following deficits and impairments:   Body Structure / Function / Physical Skills: ADL, Edema, Skin integrity, Pain, IADL, Sensation       Visit Diagnosis: Lymphedema, not elsewhere classified - Plan: Ot plan of care cert/re-cert    Problem List Patient Active Problem List   Diagnosis  Date Noted   Wound infection 04/26/2021   Chronic diastolic CHF (congestive heart failure) ([place]) 04/26/2021   CKD (chronic kidney disease), stage IIIa 04/26/2021   Iron deficiency anemia 04/26/2021   Difficulty in walking, not elsewhere classified 01/14/2021   AKI (acute kidney injury) ([place]) 12/03/2020   Hyperkalemia 12/03/2020   Edema 07/02/2020   Hypertensive heart disease without congestive heart failure 07/02/2020   Other personal history presenting hazards to health 07/02/2020   Morbid obesity ([place]) 07/02/2020   Peripheral vascular disease ([place]) 07/02/2020   Cataract 07/02/2020   Psychosexual dysfunction with inhibited sexual excitement 07/02/2020   Issue of medical certificate for disability examination 07/02/2020   Tobacco use disorder 01/29/3006   Umbilical hernia 62/26/3335   Pain due to onychomycosis of toenails of both feet 01/23/2020   Venous ulcer ([place]) 04/11/2019   Chronic venous insufficiency 07/07/2016   Venous stasis dermatitis of both lower extremities 07/07/2016   Lymphedema 07/07/2016   Type II diabetes mellitus with renal manifestations ([place]) 07/07/2016   Essential hypertension 07/07/2016    Hyperlipidemia 07/07/2016    Andrey Spearman, MS, OTR/L, CLT-LANA 10/15/21 1:27 PM      [place] MAIN [facility] SERVICES [place], [place], [address] Phone: [phone]   Fax:  [phone]  Name: [name] MRN: [id number] Date of Birth: 04-Jul-1950

## 2021-10-18 ENCOUNTER — Encounter: Payer: PPO | Admitting: Physician Assistant

## 2021-10-18 ENCOUNTER — Ambulatory Visit: Payer: PPO | Admitting: Occupational Therapy

## 2021-10-18 DIAGNOSIS — E11622 Type 2 diabetes mellitus with other skin ulcer: Secondary | ICD-10-CM | POA: Diagnosis not present

## 2021-10-18 DIAGNOSIS — I890 Lymphedema, not elsewhere classified: Secondary | ICD-10-CM

## 2021-10-18 NOTE — Therapy (Signed)
[place] MAIN [facility] SERVICES [address] [place], [place], [address] Phone: [phone]   Fax:  [phone]  Occupational Therapy Treatment  Patient Details  Name: [name] MRN: [id number] Date of Birth: 11-27-1950 Referring Provider (OT): Jeri Cos, [place]   Encounter Date: 10/18/2021   OT End of Session - 10/18/21 1015     Visit Number 2    Number of Visits 36    Date for OT Re-Evaluation 01/12/22    OT Start Time 1005    OT Stop Time 1110    OT Time Calculation (min) 65 min    Activity Tolerance Patient tolerated treatment well;No increased pain    Behavior During Therapy WFL for tasks assessed/performed             Past Medical History:  Diagnosis Date   Diabetes mellitus without complication ([place])    Hyperlipidemia    Hypertension    Leukemia ([place])     Past Surgical History:  Procedure Laterality Date   CATARACT EXTRACTION Right    TONSILLECTOMY     TOOTH EXTRACTION      There were no vitals filed for this visit.   Subjective Assessment - 10/18/21 1136     Subjective  [place] Nurse presents to Occupational Therapy for CDT to BLE. Pt is accompanied by his cousin , Baker Janus, who is here to learn to assist him with compression wrapping between visits. Pt denies LE related leg pain this morning. Pt presents with Profore wraps on both legs below the knees. He arrives in a [place] wc via Psychologist, occupational. He is able to transfer from transport char to treatment bed with extra time.Pt states he has a 2 PM appointment at the [facility] today.    Patient is accompanied by: Family member   cousin, Baker Janus, drove Pt to clinic and attended appt   Pertinent History Relevant to lymphedema: No presence of varicose veins or arterial sufficiency, and DVT and ruled out by Ronita Hipps, MD at [facility]. Pt declined venogram offered by Dr. Haynes Kerns. . Pt has a basic sequential pneumatic compression device, or "pump" , set at 40 mmHg, uses 2  x daily by report. Marland Kitchen PMH significant for leukemia, HTN, PVD, CAD and CHF. Pt has DM with neuropathy. CVI CKD, Sage 3a. Tobacco abuse disorder- quit 2010.    Limitations Impaired sensation ; increased infection risk, increased falls risk, iimpaired dynamic balance, difficulty walking, non-healing leg wounds, chronic, progressive , BLE lymphedema, impaired hip, knee and ankle AROM; impaired basic and instrumental ADLs ( bathing, grooming, dressing, skin inspection, shopping, home management, sleeping, driving, functional ambulation and transfers), impaired work and productive activities, impaired leisure pursuits (no longer able to gather with friends to play music; sedentary lifestyle) impaired social participation    Special Tests + Stemmer sign bilaterally, Intake FOTO 56/100%    Pain Onset --   leg pain onset s/p 5 years w onset of swelling                         OT Treatments/Exercises (OP) - 10/18/21 1147       ADLs   ADL Education Given Yes      Manual Therapy   Manual Therapy Edema management;Compression Bandaging    Edema Management initial RLE comparative limb volumetrics    Compression Bandaging multilayer, gradient compression wraps utilizing one ach 8 and 10 cm wide short stretch bandages over cotton stockinett and 0.4 cm  thick Rosidal foam from base of toes to tibial tuberosity                    OT Education - 10/18/21 1156     Education Details Emphasis of Pt edu on multilayer short stretch compression wrapping vs long stretch wraps. How short stretch wraps move  lymphatic congestion by accentuating pumping action of leg muscles. Demonstrated application and educated on wear and care routines.    Person(s) Educated Patient;Other (comment)   cousin Baker Janus   Methods Explanation;Demonstration    Comprehension Verbalized understanding;Returned demonstration;Need further instruction                 OT Long Term Goals - 10/15/21 1228       OT  LONG TERM GOAL #1   Title Given this patient's Intake score of 56/100% on the functional outcomes FOTO tool, patient will experience an increase in function of 3 points to 59, to improve basic and instrumental ADLs performance, including lymphedema self-care.    Baseline Max A    Time 12    Period Weeks    Status New    Target Date 01/13/22      OT LONG TERM GOAL #2   Title Pt will demonstrate understanding of lymphedema precautions and prevention strategies with modified independence using a printed reference to identify at least 5 precautions and discussing how s/he may implement them into daily life to reduce risk of progression and to limit infection risk.    Baseline Max A    Time 4    Period Days    Status New    Target Date --   4th OT Rx visit     OT LONG TERM GOAL #3   Title With Maximum caregiver assistance Pt will be able to apply multilayer, knee length, compression wraps using gradient techniques from base of toes to groin to decrease limb volume, to limit infection risk, and to limit lymphedema progression. Pt requires Max CG assist to achieve this goal b/c he is unable to reach his feet and toes.    Baseline Dependent    Time 12    Period Weeks    Status New    Target Date 01/13/22      OT LONG TERM GOAL #4   Title Pt will achieve at least a 10% limb volume reduction in BLE from ankle to groin to return limb to more normal size and shape, to limit infection risk, to decrease pain, to improve AROM and function, and to limit lymphedema progression.    Baseline dependent    Time 12    Period Weeks    Status New    Target Date 01/13/22      OT LONG TERM GOAL #5   Title Max caregiver assistance is essential in this case to complete Intensive and Self-management Phases of Complete Decongestive Therapy, including the Lymphedema self-care home program b/c Pt is unable to reach his feet to apply compression wraps and garments, to perform skin  and nail care and inspection and  to perform simple self-MLD. Pt agrees to arrange for consistent daily caregiver prior to commencing CDT.    Baseline dependent    Time 1    Period Days    Status New    Target Date --   initial Rx appointment     Long Term Additional Goals   Additional Long Term Goals Yes      OT LONG TERM GOAL #6  Title With Max caregiver assistance Pt will achieve and sustain no less than 85% compliance with all LE self-care home program components throughout CDT, including modified simple self-MLD, daily skin care and inspection, lymphatic pumping the ex and appropriate compression to limit lymphedema progression and to limit further functional decline.    Baseline dependent    Time 12    Period Weeks    Status New    Target Date 01/13/22                   Plan - 10/18/21 1015     Clinical Impression Statement Commenced RLE/RLQ CDT. Unable to treat LLE as, by Pt report,  LLE is still open and draining. Initial RLE limb volumetrics reveals RLE A-D volume measures 3792.8 ml. Unable to complete initial LKLE volumetrics today. Applied multyilayer short stretch comnpression wraps utilizing lite gradient techniques after washing R leg with soap and water. Utilized 1 each 8 anfd 10 cm short stretch wrap over single layer of cotton stockinett over 0.4 cl Rosidal foam. Began teaching Pt and caregiver skin care and bandaging regimes and techniques- intro level only. Cont as per OC.    OT Occupational Profile and History Detailed Assessment- Review of Records and additional review of physical, cognitive, psychosocial history related to current functional performance    Occupational performance deficits (Please refer to evaluation for details): ADL's;IADL's;Work;Leisure;Social Participation    Body Structure / Function / Physical Skills ADL;Edema;Skin integrity;Pain;IADL;Sensation    Rehab Potential Fair    Clinical Decision Making Multiple treatment options, significant modification of task necessary     Comorbidities Affecting Occupational Performance: Presence of comorbidities impacting occupational performance    Comorbidities impacting occupational performance description: see SUBJECTIVE    Modification or Assistance to Complete Evaluation  Min-Moderate modification of tasks or assist with assess necessary to complete eval    OT Frequency 2x / week    OT Duration 12 weeks   and PRN to complete BLE Intensive Phase CDT and fit with appropriate compression   OT Treatment/Interventions Self-care/ADL training;Therapeutic exercise;Coping strategies training;Therapeutic activities;Manual lymph drainage;Manual Therapy;Other (comment);Compression bandaging;DME and/or AE instruction;Patient/family education    Plan Intensive and Self-Management Phase of Complete Decongestive Therapy (CDT) to include MLD, skin care, ther ex and compression. Lymphedema management has a high burden of care, especially for Patients who have difficulty, or who are unable to reach their distal legs and feet.  reach their feet and distal legs to aapply bandages, compression garments, to bathe , inspect skin and groom nails. In this case because Pt is unable to perform these activities, daily caregiver assistance with the lymphedema self-care home program  between OT visits is essential for achieving clinical success. Without ongoing, daily caregiver assistance during compression bandaging, donning and doffing compression garments, skin care, and MLD , Pt's prognosis is poor. However, with consistent caregiver assistance with lymphedema home program prognosis for improved lymphedema control and reducing infection risk and progression is good.    OT Home Exercise Plan Fit with appropriate compression garments for full-time , daily use. Considerations: Pt unable to reach feet to don/ doff standard, elastic garments    Consulted and Agree with Plan of Care Patient             Patient will benefit from skilled therapeutic intervention  in order to improve the following deficits and impairments:   Body Structure / Function / Physical Skills: ADL, Edema, Skin integrity, Pain, IADL, Sensation       Visit Diagnosis:  Lymphedema, not elsewhere classified    Problem List Patient Active Problem List   Diagnosis Date Noted   Wound infection 04/26/2021   Chronic diastolic CHF (congestive heart failure) ([place]) 04/26/2021   CKD (chronic kidney disease), stage IIIa 04/26/2021   Iron deficiency anemia 04/26/2021   Difficulty in walking, not elsewhere classified 01/14/2021   AKI (acute kidney injury) ([place]) 12/03/2020   Hyperkalemia 12/03/2020   Edema 07/02/2020   Hypertensive heart disease without congestive heart failure 07/02/2020   Other personal history presenting hazards to health 07/02/2020   Morbid obesity ([place]) 07/02/2020   Peripheral vascular disease ([place]) 07/02/2020   Cataract 07/02/2020   Psychosexual dysfunction with inhibited sexual excitement 07/02/2020   Issue of medical certificate for disability examination 07/02/2020   Tobacco use disorder 14/43/1540   Umbilical hernia 08/67/6195   Pain due to onychomycosis of toenails of both feet 01/23/2020   Venous ulcer ([place]) 04/11/2019   Chronic venous insufficiency 07/07/2016   Venous stasis dermatitis of both lower extremities 07/07/2016   Lymphedema 07/07/2016   Type II diabetes mellitus with renal manifestations ([place]) 07/07/2016   Essential hypertension 07/07/2016   Hyperlipidemia 07/07/2016    Andrey Spearman, MS, OTR/L, CLT-LANA 10/18/21 12:00 PM   [place] MAIN [facility] SERVICES [place], [place], [address] Phone: [phone]   Fax:  [phone]  Name: [name] MRN: [id number] Date of Birth: 1951-04-28

## 2021-10-18 NOTE — Progress Notes (Addendum)
[name], [name] ([id number]) Visit Report for 10/18/2021 Chief Complaint Document Details Patient Name: [name] Date of Service: 10/18/2021 2:00 PM Medical Record Number: [id number] Patient Account Number: [id number] Date of Birth/Sex: 03/16/1951 (71 y.o. M) Treating RN: [name] Primary Care Provider: Fontaine No Other Clinician: Referring Provider: Fontaine No Treating Provider/Extender: Skipper Cliche in Treatment: 40 Information Obtained from: Patient Chief Complaint Left LE ulcers Electronic Signature(s) Signed: 10/18/2021 2:10:13 PM By: Worthy Keeler PA-C Entered By: Worthy Keeler on 10/18/2021 14:10:13 [name] ([id number]) -------------------------------------------------------------------------------- HPI Details Patient Name: [name] Date of Service: 10/18/2021 2:00 PM Medical Record Number: [id number] Patient Account Number: [id number] Date of Birth/Sex: 1950/06/11 (71 y.o. M) Treating RN: [name] Primary Care Provider: Fontaine No Other Clinician: Referring Provider: Fontaine No Treating Provider/Extender: Skipper Cliche in Treatment: 27 History of Present Illness HPI Description: 71 year old male who presented to the ER with bilateral lower extremity blisters which had started last week. he has a past medical history of leukemia, diabetes mellitus, hypertension, edema of both lower extremities, his recurrent skin infections, peripheral vascular disease, coronary artery disease, congestive heart failure and peripheral neuropathy. in the ER he was given Rocephin and put on Silvadene cream. he was put on oral doxycycline and was asked to follow-up with the [facility]. His last hemoglobin A1c was 6.6 in December and he checks his blood sugar once a week. He does not have any physicians outside the [place] system. He does not recall any vascular duplex studies done either for arterial or venous disease but was told to  wear compression stockings which he does not use 05/30/2016 -- we have not yet received any of his notes from the [facility] hospital system and his arterial and venous duplex studies are scheduled here in [place] around mid February. We are unable to have his insurance accepted by home health agencies and hence he is getting dressings only once a week. 06/06/16 -- -- I received a call from the patient's PCP at the [facility] and spoke to Dr. Garvin Fila, phone number [phone] and fax number [phone]. She confirmed that no vascular testing was done over the last 5 years and she would be happy to do them if the patient did want them to be done at the [place] and we could fax him a request. Readmission: 71 year old male seen by as in February of this year and was referred to vein and vascular for studies and opinion from the vascular surgeons. The patient returns today with a fresh problem having had blisters on his left lower extremity which have been there for about 5 days and he clearly states that he has been wearing his compression stockings as advised though he could not read the moderate compression and has been wearing light compression. Review of his electronic medical records note that he had lower extremity arterial duplex examination done on 06/23/2016 which showed no hemodynamically significant stenosis in the bilateral lower extremity arterial system. He also had a lower extremity venous reflux examination done on 07/07/2016 and it was noted that he had venous incompetence in the right great saphenous vein and bilateral common femoral veins. Patient was seen by Dr. Tamala Julian on the same day and for some reason his notes do not reflect the venous studies or the arterial studies and he recommended patient do a venous duplex ultrasound to look for reflux and return to see him.he would also consider a lymph pump if required. The patient was told that his workup  was normal and hence the  patient canceled his follow-up appointment. 02/03/17 on evaluation today patient left medial lower extremity blister appears to be doing about the same. It is still continuing to drain and there's still the blistered skin covering the wound bed which is making it difficult for the alternate to do its job. Fortunately there is no evidence of cellulitis. No fevers chills noted. Patient states in general he is not having any significant discomfort. Patient's lower extremity arterial duplex exam revealed that patient was hemodynamically stable with no evidence of stenosis in regard to the bilateral lower extremities. The lower extremity venous reflux exam revealed the patient had venous incontinence noted in the right greater saphenous and bilateral common femoral vein. There is no evidence of deep or superficial vein thrombosis in the bilateral lower extremities. Readmission: 11/12/18 Patient presents for evaluation our clinic today concerning issues that he is having with his left lower extremity. He tells me that a couple weeks ago he began developing blisters on the left lower extremity along with increased swelling. He typically wears his compression stockings on a regular basis is previously been evaluated both here as well is with vascular surgery they would recommend lymphedema pumps but unfortunately that somehow fell through and he never heard anything back from that. Nonetheless I think lymphedema pumps would be beneficial for this patient. He does have a history of hypertension and diabetes. Obviously the chronic venous stasis and lymphedema as well. At this point the blisters have been given in more trouble he states sometimes when the blisters openings able to clean it down with alcohol and it will dry out and do well. Unfortunately that has not been the case this time. He is having some discomfort although this mean these with cleaning the areas he doesn't have discomfort just on a regular  basis. He has not been able to wear his compression stockings since the blisters arose due to the fact that of course it will drain into the socks causing additional issues and he didn't have any way to wrap this otherwise. He has increased to taking his Lasix every day instead of every other day. He sees his primary care provider later this month as well. No fevers, chills, nausea, or vomiting noted at this time. 11/19/18-Patient returns at 1 week, per intake RN the amount of seepage into the compression wraps was definitely improved, overall all the wounds are measuring smaller but continuing silver alginate to the wounds as primary dressing 11/26/18 on evaluation today patient appears to be doing quite well in regard to his left lower Trinity ulcers. In fact of the areas that were noted initially he only has two regions still open. There is no evidence of active infection at this time. He still is not heard anything from the company regarding lymphedema pumps as of yet. Again as previously seen vascular they have not recommended any surgical intervention. 12/03/2018 on evaluation today patient actually appears to be doing quite well with regard to his lower extremity ulcers. In fact most of the areas appear to be healed the one spot which does not seem to be completely healed I am unsure of whether or not this is really draining that much but nonetheless there does not appear to be any signs of infection or significant drainage at this point. There is no sign of fever, chills, nausea, vomiting, or diarrhea. Overall I am pleased with how things have progressed I think is very close to being able to transition  to his home compression stockings. SADIEL, MOTA ([id number]) 12/10/2018 upon evaluation today patient appears to be doing quite well with regard to his left lower extremity. He has been tolerating the dressing changes without complication. Fortunately there is no signs of active infection at this  time. He appears after thorough evaluation of his leg to only have 1 small area that remains open at this point everything else appears to be almost completely closed. He still have significant swelling of the left lower extremity. We had discussed discussing this with his primary care provider he is not able to see her in person they were at the [facility] and right now the [place] is not seeing patients on site. According to the patient anyway. Subsequently he did speak with her apparently and his primary care provider feels that he may likely have a DVT. With that being said she has not seen his leg she is just going off of his history. Nonetheless that is a concern that the patient now has as well and while I do not feel the DVT is likely we can definitely ensure that that is not the case I will go ahead and see about putting that order in today. Nonetheless otherwise I am in a recommend that we continue with the current wound care measures including the compression therapy most likely. We just need to ensure that his leg is indeed free of any DVTs. 12/17/2018 on evaluation today patient actually appears to be completely healed today. He does have 2 very small areas of blistering although this is not anything too significant at this point which is good news. With that being said I am in agreement with the fact that I think he is completely healed at this point. He does want to get back into his compression stocking. The good news is we have gotten approval from insurance for his lymphedema pumps we received a letter since last saw him last week. The other good news is his study did come back and showed no evidence of a DVT. 12/20/2018 on evaluation today patient presents for follow-up concerning his ongoing issues with his left lower extremity. He was actually discharged last Friday and did fairly well until he states blisters opened this morning. He tells me he has been wearing his compression  stocking although he has a hard time getting this on. There does not appear to be any signs of active infection at this time. No fevers, chills, nausea, vomiting, or diarrhea. 12/27/2018 on evaluation today patient appears to be doing very well with regard to his swelling of the left lower extremity the 4 layer compression wrap seems to have been beneficial for him. Fortunately there is no signs of active infection at this time. Patient has been tolerating the compression wrap without complication and his foot swelling in particular appears to be greatly improved. He does still have a wound on the lateral portion of his left leg I believe this is more of a blister that has now reopened. 01/03/2019 on evaluation today patient actually appears to be doing excellent in regard to his left lower extremity. He did receive his compression pumps and is actually use this 7 times since he was last here in the office. On top of the compression wrap he is now roughly 3 cm better at the calf and 2 cm better at the ankle he also states that his foot seem to go an issue better without even having to use a shoe horn. Obviously I  think this is all evidence that he is doing excellent in this regard. The other good news is he does not appear to have anything open today as far as wounds are concerned. 01/15/2019 on evaluation today patient appears to be doing more poorly yet again with regard to his left lower extremity. He has developed new wounds again after being discharged just recently. Unfortunately this continues to be the case that he will heal and then have subsequent new wounds. The last time I was hopeful that he may not end up coming back too quickly especially since he states he has been using his lymphedema pumps along with wearing his compression. Nonetheless he had a blister on the back of his leg that popped up on the left and this has opened up into an ulceration it is quite painful. 01/22/19 on evaluation  today patient actually appears to be doing well with regard to his wound on the left lower extremity. He's been tolerating the dressing changes without complication including the compression wrap in the wound appears to be significantly smaller today which is great news. Overall very pleased in this regard. 01/29/2019 on evaluation today patient appears to be doing well with regard to his left posterior lower extremity ulcer. He has been tolerating the dressing changes without complication. This is not completely healed but is getting much closer. We did order a Farrow wrap 4000 for him he has received this and has it with him today although I am not sure we are quite ready to start him on that as of yet. We are very close. 02/05/2019 on evaluation today patient actually appears to be doing quite well with regard to his left posterior lower extremity ulcer. He still has a very tiny opening remaining but the fortunate thing is he seems to be healing quite nicely. He also did get his Farrow wrap which I am hoping will help with his edema control as well at home. Fortunately there is no evidence of active infection. 02/12/2019 patient and fortunately appears to be doing poorly in regard to his wounds of the left lower extremity. He was very close to healing therefore we attempted to use his Velcro compression wraps continuing with lymphedema pumps at home. Unfortunately that does not seem to have done very well for him. He tells me that he wore them all the time but again I am not sure why if that is the case that he is having such significant edema. He is still on his fluid pills as well. With that being said there is no obvious sign of infection although I do wonder about the possibility of infection at this time as well. 02/19/2019 unfortunately upon evaluation today patient appears to be doing more poorly with regard to his left lower extremity. He is not showing signs of significant improvement and I  think the biggest issue here is that he does have an infection that appears to likely be Pseudomonas. That is based on the blue-green drainage that were noted at this time. Unfortunately the antibiotic that has been on is not going to take care of this at all. I think they will get a need to switch him to either Levaquin or Cipro and this was discussed with the patient. 02/26/2019 on evaluation today patient's lower extremity on the left appears to be doing significantly better as compared to last evaluation. Fortunately there is no signs of active infection at this time. He has been tolerating the compression wrap without complication in  fact he made it the whole week at this point. He is showing signs of excellent improvement I am very happy in this regard. With that being said he is having some issues with infection we did review the results of his culture which I noted today. He did have a positive finding for Enterobacter as well as Alcaligenes faecalis. Fortunately the Levaquin that I placed him on will work for both which is great news. There is no signs of systemic infection at this point. 10/30; left posterior leg wound in the setting of very significant edema and what looks like chronic venous inflammation. He has compression pumps but does not use them. We have been using 3 layer compression. Silver alginate to the wound as the primary dressing 03/18/2019 on evaluation today patient appears to be doing a little better compared to last time I saw him. He really has not been using his compression pumps he tells me that he is having too much discomfort. He has been keeping his wraps on however. He is only been taking his fluid pills every other day because he states they are not really helping and he has an appointment with his primary care provider at the [facility] tomorrow. Subsequently the wound itself on the left lower extremity does seem to be greatly improved compared to previous. 03/25/2019 on  evaluation today patient appears to be doing better with regard to his wounds on the bilateral lower extremities. The left is doing excellent the right is also doing better although both still do show some signs of open wounds noted at this point unfortunately. Fortunately there is no signs of active infection at this time. The patient also is not really having any significant pain which is good news. Unfortunately there was some confusion with the referral on vascular disease and as far as getting the patient scheduled there can be contacting him later today to do this COTY, LARSH ([id number]) fortunately we got this straightened out. 04/01/2019 on evaluation today patient appears to be doing no fevers, chills, nausea, vomiting, or diarrhea. Excellent at this time with regard to his lower extremities. There does not appear to be any open wound at this point which is good news. Fortunately is also no signs of active infection at this time. Overall feel like the patient has done excellent with the compression the problem is every time we got him to this point and then subsequently go to using his own compression things just go right back to where they were. I am not sure how to address this we can try to get an appointment with vascular for 2 weeks now they have yet to call him. Obviously this has become frustrating for the patient as well. I think the issue has just been an honest error as far as scheduling is concerned but nonetheless still worn out the point where I am unsure of which direction we should take. 04/08/2019 on evaluation today patient actually appears to be doing well with regard to his lower extremities. There are no open wounds at this time and things seem to be managing quite nicely as far as the overall edema control is concerned. With that being said he does have his compression socks today for [place] to go ahead and reinitiate therapy in that manner at this point. He is going to be  going for shoes to be measured on Wednesday and then coincidentally he will also be seeing vascular on Thursday. Overall I think this is good news and  again I am hopeful that they will be able to do something for him to help prevent ongoing issues with edema control as well. No fevers, chills, nausea, vomiting, or diarrhea. 04/11/2019 on evaluation today patient actually appears to be doing poorly after just being discharged on Monday of this week. He had been experiencing issues with again blisters especially on the left lower extremity. With that being said he was completely healed and appeared to be doing great this past Monday. He then subsequently has new blisters that formed before his appointment with vascular this morning. He was also measured for shoes in the interim. With that being said we may have figured out what exactly is going on and why he continues to have issues like what we are seeing at this point. He takes his compression stockings off at nighttime and then he ends up having to sleep in his chair for 5-6 hours a night. He sleeps with his feet down he cannot really get him up in the recliner and therefore he is sleeping and the worst possible his position with his feet on the floor for that majority of the time. Again as I explained to him that is about one third at minimum at least one fourth of his day that he spending with his feet dangling down on the ground and the worst possible position they could be. I think this may be what is causing the issue. Subsequently I am leaning toward thinking that he may need a hospital bed in order to elevate his legs. We likely can have to coordinate this with his primary care provider at the [facility]. Readmission: 01/26/2021 this is a patient who presents for repeat evaluation here in the clinic although it is actually been couple of years since have seen him in fact it was December 2020 when I last saw him. Subsequently he never really  healed but did end up being lost to follow-up. He tells me has been having issues ongoing with his lower extremities has bilateral lower extremity lymphedema no real significant or definitive open wounds but in general his lymphedema is way out of control. We were never able to refer him to lymphedema clinic simply due to the fact to be honest we were never able to get him completely healed. I do not see anyone with open wounds. The patient does have evidence of type 2 diabetes mellitus, lymphedema, chronic venous insufficiency, and hypertension. That really has not changed since his last evaluation. 02/09/2021 upon evaluation today patient appears to be doing a little better in regard to his legs although he still having a tremendous amount of drainage especially on the left leg. Fortunately there does not appear to be any evidence of active infection. Of note when we looked into this further it appears that the patient did not have any absorptive dressing on it was just the 4-layer compression wrap. Nonetheless this is probably big part of the issue here. 10/10; he comes in today with 3 large areas on the upper right lower leg likely remanence of denuded blistering under his compression wraps. He has no other wounds on the right. On the left he has the denuded area on the left medial foot and ankle and on the left dorsal foot. Massive lymphedema in both feet dorsally. Using Zetuvit under compression We have increased home health visitation to twice a week to change the dressings and will change it once 02/22/2021 upon evaluation today patient appears to be doing well currently  with regard to his wounds. He has been tolerating the dressing changes without complication. Fortunately there does not appear to be any evidence of active infection which is great news. No fevers, chills, nausea, vomiting, or diarrhea. The biggest issue I see currently is that home health is not putting any medicine on the  actual wounds before wrapping. 03/01/2021 upon evaluation today the patient's right leg actually appears to be doing quite well which is great news there does not appear to be any evidence of active infection at this time. No fevers, chills, nausea, vomiting, or diarrhea. With that being said the patient is having issues on the left foot where he is having significant drainage is also an ammonia smell he does not have any animals at home and this makes me concerned about a bacteria producing urea as a byproduct. Again the possible common organisms will be E. coli, Proteus, and Enterococcus. All 3 of which can be successfully treated with Levaquin. For that reason I think that this may be a good option for [place] to consider placing him on and I did obtain a culture as well for confirmation sake. 03/08/2021 upon evaluation today patient appears to be doing unfortunately still somewhat poorly in regard to his leg ulcerations. He actually has an area on the right leg where he blistered due to the fact that his wrap slid down and caused an area of pinching on his skin and this has led to a significant issue here. 03/15/2021 upon evaluation today patient unfortunately has not been wrapped appropriately with absorptive dressings nor with the appropriate technique for the third layer of the 4-layer compression wrap. These are issues that we continue to try to address with the home health nurse. Also the absorptive dressing that she had was cut in half and therefore that causes things to leak out it does not actually trap the fluid in regard to the top of the foot overall I think that all these combined are really not seeing things improved significantly here. Fortunately there does not appear to be any signs of significant infection at this time which is good news. He still is having a tremendous amount of drainage. 03/22/2021 upon evaluation today patient appears to be draining tremendously. He still continues  to tell me that he is using his pumps 2 times a day and that coupled with that tells me that he is elevating his legs as well. With that being said all things considered I am really just not seeing the improvement we would expect to see with the 4-layer compression wrap and all the above noted. He in fact had an extremely large Zetuvit dressing on both legs and that they were extremely filled to the max with fluid. This is after just being changed just before the weekend and this is Monday. Nonetheless I am concerned about the fact that there is something going on fairly significant that we cannot get any of this under control and that he is draining this significantly. He supposed be having an echocardiogram it sounds like scheduling has been an issue for him as far as getting in sooner. Its something to do with needing his cousin to drive him because of where it sat and he cannot drive himself to this appointment either way I really think he needs to try to see what he can do about making this happen a little sooner. He tells me he will call today. JUJHAR, EVERETT ([id number]) 03/29/2021 on evaluation today patient appears to be  doing about the same in regard to his legs. He did get his cardiology appointment moved up to 6 December which is at least good that is better than what it was before mid December. Overall very pleased in that regard. 04/05/2021 upon evaluation today patient unfortunately is still doing fairly poorly. There does not appear to be any signs of active infection at this time. No fevers, chills, nausea, vomiting, or diarrhea. Unfortunately I think until his edema is under control and overall fluid overload there is really not to be much chance that I can do much to get him better. This is quite unfortunate and frustrating both for myself and the patient to be perfectly honest. Nonetheless I think that he really needs to have a conversation both with his primary care provider as well  as cardiologist he sees the PCP on Monday and cardiology on Wednesday of next week. 04/13/2021 upon evaluation today patient appears to be doing poorly in regard to his bilateral lower extremities his left is still worse than the right. With that being said he has a tremendous amount of drainage he did see his primary care provider yesterday there really was not much there to be done from their perspective. He sees cardiology tomorrow. Nonetheless my biggest concern here is simply that if we do not get the edema under control he is going to continue to have drainage and honestly I think at some point he is going to become infected severely that is my main concern. 04/19/2021 upon evaluation today patient appears to be doing poorly still in regard to his legs. Unfortunately there does not appear to be any signs of infection at this point. He does have a tremendous amount of drainage however. We have not seen the results back from the cardiologist and the echocardiogram that was done. It appears that the patient checked out okay as far as that is concerned with regard to ejection fraction though we still have some issues here to be honest with his diastolic function. I am unsure if this is accounting for everything that we are seeing or not. Either way he has a tremendous amount of drainage from his legs that we are just not able to control in the outpatient setting at this point. I have reached out to Dr. Rockey Situ his cardiologist to see once he reviews the sheet if there is anything that he feels like can be done from an outpatient perspective if not then I think the way to go is probably can to be through inpatient admission and diuresis. Otherwise I am not sure how working to get this under control we tried antibiotics, compression wrapping, and I have told the patient to be elevating his legs I am not sure how much he does of this but either way I think that this is still an ongoing issue  nonetheless. 04/26/2021 upon evaluation today patient appears to be doing poorly in regard to his legs. He is having a tremendous amount of fluid at this point which is quite unfortunate. Its to the point that he may have had at least 5 to 10 pounds of fluid in his dressings this morning when they were removed these were changed this Friday. Subsequently I think he needs to go to the ER for further evaluation and treatment I think is probably can need diuresis possibly even IV antibiotics been on what the blood work looks like but in general I feel like he needs something to get this under control from  an outpatient perspective absent of everywhere I can think of and I cannot get this under control with our traditional measures. I think this is going require more so that we can get him better 12/30; this is a patient with severe bilateral lymphedema. He was hospitalized from 04/26/2021 through 04/29/2021 treated for cellulitis in the setting of lower extremity ulcers and lymphedema. After he left the hospital he is apparently seen for nurse visit our staff contacted cardiology and he has been started on Lasix 40 mg. Apparently his legs have less edema. Lab work from 05/04/2021 showed a BUN of 38 and creatinine of 1.59 these are elevated versus previous where his creatinine seems to have been 1.30 on 12/19 his potassium is 4.3. I believe the lab work is being followed by cardiology We have him in a 4-layer wrap. Xeroform on the leg wounds and sit to fit on the Berry damage skin on the left dorsal foot versus right dorsal foot. He has compression pumps but does not use them. We have apparently not yet ordered him compression stockings 05/17/2021 upon evaluation today patient's legs though better than last time I personally saw him appear to be getting worse compared to where they were previous. Dr. Quentin Cornwall was actually last 1 to see you I have not seen him since 19 December. That was before he went into  the ER. Coming out apparently his legs looked also and they still look better but not as good as they were in the past. 1/16; patient with severe bilateral lymphedema. Severe scaled hyperkeratotic skin on the dorsal aspect of his distal left foot and left medial ankle.. On the right side changes are not as bad. He did not have any weeping edema. Our intake nurse was convinced that he is being compliant with compression pumps 1 hour twice a day 05/31/2021 upon evaluation today patient actually appears to be doing a little bit better in my opinion in regard to his feet. I do not see as much drainage and it being just completely wet as it was previous. Fortunately I do not also see any signs of active infection which is great news as well. 06/07/21 Upon inspection patient's wound bed actually showed signs of doing well he is not nearly as weepy and wet as he has been in the past and overall very pleased in that regard. Fortunately I do not see any signs of active infection locally or nor systemically at this time. Which is great news. No fevers, chills, nausea, vomiting, or diarrhea. 06/14/2021 upon evaluation today patient appears to be doing well with regard to his right foot I am pleased in that regard. His left foot is still draining quite a bit despite using lymphedema pumps, 4-layer compression wraps, and he tells me elevating his legs as well. He also has Lasix that he takes twice a day. Nonetheless I believe that this is still good to be an ongoing issue. We have a hard time getting this under control as far as the swelling is concerned. 06/21/2021 upon evaluation today patient appears to be doing decently well in regard to his wounds all things considered. He still has a tremendous amount of drainage and fluid noted at this point. Fortunately I do not see any signs of active infection locally or systemically at this point which is great news. Nonetheless I am unsure where to go and how to do this  as far as trying to limit his swelling and weeping from his toes in particular.  06/28/2021 upon evaluation patient unfortunately continues to have significant drainage from his feet. We have been keeping him in a compression wrap and despite this he still continues to have extreme fluid issues he seen his cardiologist he is seeing the nephrologist. We really cannot find any way to get this under control when he did well was when he was in the hospital and they got some of the fluid away. But outside of that we are just struggling to achieve the long-term goal of getting this under control and keeping it under control unfortunately. 07/05/2021 upon evaluation today patient appears to be doing poorly in regard to his feet. Unfortunately this continues to be a significant issue and to be honest I am really not certain what to do about it. I referred him to Dr. Randol Kern at [facility] and he does have an appointment although it is 12 April. He also sees his primary care provider on 6 April. He did not want to see Dr. Haynes Kerns until after he saw his PCP that is the reason the appointment so far out. There is really not much I can do in that regard. Nonetheless I do think that we are still continue to have significant lymphedema issues with significant mount of weeping in regard to the feet and again this has just become extremely difficult to manage to be honest I am not sure if there is something else that Dr. Haynes Kerns or someone else could recommend he also will be seeing Dr. Dellia Nims in 2 weeks when I am on vacation and at that time I will see if Dr. Dellia Nims has any ideas about where to go from here in the meantime. TARRON, KROLAK ([id number]) 07/12/2021 upon evaluation today patient appears to be doing about as well as can be expected with regard to his feet. He does actually see his kidney doctor this Friday. He also will be seeing his primary care provider on April 4 and then following that around mid April he will be  seeing Dr. Haynes Kerns at [facility] which was a referral made for him. Again my goal is to try to find out some way to fix this and to be perfectly honest we have had some issues with making any good adjustments. When he was in the hospital and greater amounts of Lasix he was able to get this down and it looked much better upon discharge. With that being said right now things just are not doing nearly as good as what they used to be. 3/13; patient presents for follow-up. He has been using his lymphedema pumps over the past week. He reports an increase in his Lasix dose. He has no issues or complaints today. He denies signs of infection. 07/26/2021 upon evaluation today patient appears to be doing better in regard to his feet bilaterally. Both are showing signs of much less drainage which is great news and overall very pleased in that regard. Fortunately there does not appear to be any evidence of active infection locally or systemically at this time. No fevers, chills, nausea, vomiting, or diarrhea. 08/02/2021 upon evaluation today patient appears to be doing well with regard to his feet. Both are showing signs of being drier the right pretty much has not really draining much at all which is great news. The left is not draining anywhere close to his much as it was during the last evaluation. This is excellent news and overall very pleased. 08/09/2021 upon evaluation today patient appears to be doing well with regard to  his legs the right leg especially showing signs of excellent improvement which is great news I do not see any evidence of active infection locally or systemically which is great. In regard to the left leg he still has some weeping and drainage but nothing as significant as what it was in the past this is great news. 08-16-2021 upon evaluation today patient appears to actually be doing quite well in my opinion in regard to his feet. This is significantly improved compared to what we previously seen  and overall I am extremely pleased in that regard. I do believe that He is actually improving although this is obviously very slow going. 08-23-2021 upon evaluation today patient appears to be doing well currently in regard to his right leg which actually is pretty dry at this point today. Fortunately I do not see any evidence of active infection at this time which is great news. No fevers, chills, nausea, vomiting, or diarrhea. 08-30-2021 upon evaluation today patient appears to be doing well with regard to his lower extremities. The right foot is pretty much completely dry which is great news the left foot though not completely dry seems to be doing decently well. I do not see any signs of active infection locally or systemically which is great news. No fevers, chills, nausea, vomiting, or diarrhea. 09-06-2021 upon evaluation today patient appears to be doing well with regard to his feet in fact now the left foot is almost completely dry as well and I am definitely seeing a lot of significant improvement. With that being said unfortunately he actually appears to have some cellulitis of his right thigh. His toes are also little bit red but this may just be due to the increased swelling. He really is not warm to touch in regard to the toes. 5/8; excellent edema control on the right foot and lower leg there is no open wounds but we continue to put compression on this otherwise this will breakdown. He is using his compression pumps twice a day at home The area that is problematic is on the left dorsal foot some areas that are not fully epithelialized with very dry fissured skin over this area. 09-20-2021 upon evaluation today patient appears to be doing a little bit worse in regard to swelling at this time. Fortunately I do not see any signs of infection with that being said the wrap was not on quite as well as what I would like to have seen. I do believe that this has caused a little bit of excess swelling  and again we need to try to get this under good control. 09-27-2021 upon evaluation today patient appears to be doing awesome in regard to his feet and legs. Everything is measuring smaller the swelling is down and to be perfectly honest I am extremely pleased with the end of his feet especially on the left side and how dry this is today. I do think we are on the right track here. 5/31; patient presents for follow-up. He is using nystatin powder to the feet bilaterally under 4-layer compression. He has no issues or complaints today. He states he is going to the lymphedema clinic tomorrow. Since he has no open wounds on the right lower extremity they will be focused on the side. 10-11-2021 upon evaluation today patient appears to be doing excellent in regard to his legs. He has been tolerating the dressing changes without complication. Fortunately there does not appear to be any evidence of active infection locally or  systemically at this time which is great news. No fevers, chills, nausea, vomiting, or diarrhea. 10-18-2021 upon evaluation today patient appears to be doing well with regard to his legs. He has been tolerating the dressing changes without complication and actually seen in lymphedema clinic now in regard to his right leg were taken care of the left leg. Fortunately I do not see any evidence of active infection locally or systemically at this time. Electronic Signature(s) Signed: 10/18/2021 2:28:36 PM By: Worthy Keeler PA-C Entered By: Worthy Keeler on 10/18/2021 14:28:36 [name] ([id number]) -------------------------------------------------------------------------------- Physical Exam Details Patient Name: [name] Date of Service: 10/18/2021 2:00 PM Medical Record Number: [id number] Patient Account Number: [id number] Date of Birth/Sex: May 15, 1950 (71 y.o. M) Treating RN: [name] Primary Care Provider: Fontaine No Other Clinician: Referring Provider:  Fontaine No Treating Provider/Extender: Skipper Cliche in Treatment: 40 Constitutional Well-nourished and well-hydrated in no acute distress. Respiratory normal breathing without difficulty. Psychiatric this patient is able to make decisions and demonstrates good insight into disease process. Alert and Oriented x 3. pleasant and cooperative. Notes Upon inspection patient's wound bed showed evidence of good granulation epithelization at this point. Fortunately I do not see any evidence of infection which is great news and overall I think you are on the right track. He has foot is extremely small compared to what it used to be. Electronic Signature(s) Signed: 10/18/2021 2:28:55 PM By: Worthy Keeler PA-C Entered By: Worthy Keeler on 10/18/2021 14:28:55 [name] ([id number]) -------------------------------------------------------------------------------- Physician Orders Details Patient Name: [name] Date of Service: 10/18/2021 2:00 PM Medical Record Number: [id number] Patient Account Number: [id number] Date of Birth/Sex: 13-Dec-1950 (71 y.o. M) Treating RN: [name] Primary Care Provider: Fontaine No Other Clinician: Referring Provider: Fontaine No Treating Provider/Extender: Skipper Cliche in Treatment: 76 Verbal / Phone Orders: No Diagnosis Coding ICD-10 Coding Code Description E11.622 Type 2 diabetes mellitus with other skin ulcer I89.0 Lymphedema, not elsewhere classified I87.2 Venous insufficiency (chronic) (peripheral) L97.822 Non-pressure chronic ulcer of other part of left lower leg with fat layer exposed L97.512 Non-pressure chronic ulcer of other part of right foot with fat layer exposed I10 Essential (primary) hypertension L97.811 Non-pressure chronic ulcer of other part of right lower leg limited to breakdown of skin Follow-up Appointments o Return Appointment in 1 week. o Nurse Visit as needed o Other: Bathing/  Shower/ Hygiene o May shower with wound dressing protected with water repellent cover or cast protector. o No tub bath. Edema Control - Lymphedema / Segmental Compressive Device / Other Bilateral Lower Extremities o Optional: One layer of unna paste to top of compression wrap (to act as an anchor). - Unna paste on calf to secure wrap in place as needed o 4 Layer Compression System Lymphedema. - left lower leg -q week bi lat, nystatin powder between toes , 4x4 gauze between toes , ABD to cover toes; zetuvi over top of foot, charcoal pad , tubi grip C to foot and ankle double layer COTTON LAYER SPIRAL, LIGHT TAN SPIRAL. WHITE WITH YELLOW LINE FIGURE 8 , COBAN SPIRAL o Tubigrip double layer applied - size C left foot ankle foods o Elevate, Exercise Daily and Avoid Standing for Long Periods of Time. o Elevate legs to the level of the heart and pump ankles as often as possible o Elevate leg(s) parallel to the floor when sitting. o Compression Pump: Use compression pump on left lower extremity for 60 minutes, twice daily. - 2 times per day o DO  YOUR BEST to sleep in the bed at night. DO NOT sleep in your recliner. Long hours of sitting in a recliner leads to swelling of the legs and/or potential wounds on your backside. o Other: - Contact prescriber regarding use of diuretics to reduce fluid overload. Off-Loading o Turn and reposition every 2 hours Additional Orders / Instructions o Follow Nutritious Diet and Increase Protein Intake Electronic Signature(s) Signed: 10/18/2021 3:46:28 PM By: Worthy Keeler PA-C Signed: 10/19/2021 2:49:16 PM By: [name] RN Entered By: [name] on 10/18/2021 15:21:03 [name] ([id number]) -------------------------------------------------------------------------------- Problem List Details Patient Name: [name] Date of Service: 10/18/2021 2:00 PM Medical Record Number: [id number] Patient Account Number: [id number] Date  of Birth/Sex: 20-Nov-1950 (71 y.o. M) Treating RN: [name] Primary Care Provider: Fontaine No Other Clinician: Referring Provider: Fontaine No Treating Provider/Extender: Skipper Cliche in Treatment: 37 Active Problems ICD-10 Encounter Code Description Active Date MDM Diagnosis E11.622 Type 2 diabetes mellitus with other skin ulcer 01/26/2021 No Yes I89.0 Lymphedema, not elsewhere classified 01/26/2021 No Yes I87.2 Venous insufficiency (chronic) (peripheral) 01/26/2021 No Yes L97.822 Non-pressure chronic ulcer of other part of left lower leg with fat layer 01/26/2021 No Yes exposed L97.512 Non-pressure chronic ulcer of other part of right foot with fat layer 01/26/2021 No Yes exposed [place] (primary) hypertension 01/26/2021 No Yes L97.811 Non-pressure chronic ulcer of other part of right lower leg limited to 02/15/2021 No Yes breakdown of skin Inactive Problems Resolved Problems Electronic Signature(s) Signed: 10/18/2021 2:10:10 PM By: Worthy Keeler PA-C Entered By: Worthy Keeler on 10/18/2021 14:10:10 [name] ([id number]) -------------------------------------------------------------------------------- Progress Note Details Patient Name: [name] Date of Service: 10/18/2021 2:00 PM Medical Record Number: [id number] Patient Account Number: [id number] Date of Birth/Sex: October 19, 1950 (71 y.o. M) Treating RN: [name] Primary Care Provider: Fontaine No Other Clinician: Referring Provider: Fontaine No Treating Provider/Extender: Skipper Cliche in Treatment: 37 Subjective Chief Complaint Information obtained from Patient Left LE ulcers History of Present Illness (HPI) 71 year old male who presented to the ER with bilateral lower extremity blisters which had started last week. he has a past medical history of leukemia, diabetes mellitus, hypertension, edema of both lower extremities, his recurrent skin infections,  peripheral vascular disease, coronary artery disease, congestive heart failure and peripheral neuropathy. in the ER he was given Rocephin and put on Silvadene cream. he was put on oral doxycycline and was asked to follow-up with the [facility]. His last hemoglobin A1c was 6.6 in December and he checks his blood sugar once a week. He does not have any physicians outside the [place] system. He does not recall any vascular duplex studies done either for arterial or venous disease but was told to wear compression stockings which he does not use 05/30/2016 -- we have not yet received any of his notes from the [facility] hospital system and his arterial and venous duplex studies are scheduled here in [place] around mid February. We are unable to have his insurance accepted by home health agencies and hence he is getting dressings only once a week. 06/06/16 -- -- I received a call from the patient's PCP at the [facility] at [facility] and spoke to Dr. Garvin Fila, phone number [phone] and fax number [phone]. She confirmed that no vascular testing was done over the last 5 years and she would be happy to do them if the patient did want them to be done at the [place] and we could fax him a request. Readmission: 71 year old male seen by as  in February of this year and was referred to vein and vascular for studies and opinion from the vascular surgeons. The patient returns today with a fresh problem having had blisters on his left lower extremity which have been there for about 5 days and he clearly states that he has been wearing his compression stockings as advised though he could not read the moderate compression and has been wearing light compression. Review of his electronic medical records note that he had lower extremity arterial duplex examination done on 06/23/2016 which showed no hemodynamically significant stenosis in the bilateral lower extremity arterial system. He also had a lower extremity venous reflux  examination done on 07/07/2016 and it was noted that he had venous incompetence in the right great saphenous vein and bilateral common femoral veins. Patient was seen by Dr. Tamala Julian on the same day and for some reason his notes do not reflect the venous studies or the arterial studies and he recommended patient do a venous duplex ultrasound to look for reflux and return to see him.he would also consider a lymph pump if required. The patient was told that his workup was normal and hence the patient canceled his follow-up appointment. 02/03/17 on evaluation today patient left medial lower extremity blister appears to be doing about the same. It is still continuing to drain and there's still the blistered skin covering the wound bed which is making it difficult for the alternate to do its job. Fortunately there is no evidence of cellulitis. No fevers chills noted. Patient states in general he is not having any significant discomfort. Patient's lower extremity arterial duplex exam revealed that patient was hemodynamically stable with no evidence of stenosis in regard to the bilateral lower extremities. The lower extremity venous reflux exam revealed the patient had venous incontinence noted in the right greater saphenous and bilateral common femoral vein. There is no evidence of deep or superficial vein thrombosis in the bilateral lower extremities. Readmission: 11/12/18 Patient presents for evaluation our clinic today concerning issues that he is having with his left lower extremity. He tells me that a couple weeks ago he began developing blisters on the left lower extremity along with increased swelling. He typically wears his compression stockings on a regular basis is previously been evaluated both here as well is with vascular surgery they would recommend lymphedema pumps but unfortunately that somehow fell through and he never heard anything back from that. Nonetheless I think lymphedema pumps would be  beneficial for this patient. He does have a history of hypertension and diabetes. Obviously the chronic venous stasis and lymphedema as well. At this point the blisters have been given in more trouble he states sometimes when the blisters openings able to clean it down with alcohol and it will dry out and do well. Unfortunately that has not been the case this time. He is having some discomfort although this mean these with cleaning the areas he doesn't have discomfort just on a regular basis. He has not been able to wear his compression stockings since the blisters arose due to the fact that of course it will drain into the socks causing additional issues and he didn't have any way to wrap this otherwise. He has increased to taking his Lasix every day instead of every other day. He sees his primary care provider later this month as well. No fevers, chills, nausea, or vomiting noted at this time. 11/19/18-Patient returns at 1 week, per intake RN the amount of seepage into the compression  wraps was definitely improved, overall all the wounds are measuring smaller but continuing silver alginate to the wounds as primary dressing 11/26/18 on evaluation today patient appears to be doing quite well in regard to his left lower [place] ulcers. In fact of the areas that were noted initially he only has two regions still open. There is no evidence of active infection at this time. He still is not heard anything from the company regarding lymphedema pumps as of yet. Again as previously seen vascular they have not recommended any surgical intervention. DAYON, WITT ([id number]) 12/03/2018 on evaluation today patient actually appears to be doing quite well with regard to his lower extremity ulcers. In fact most of the areas appear to be healed the one spot which does not seem to be completely healed I am unsure of whether or not this is really draining that much but nonetheless there does not appear to be any signs  of infection or significant drainage at this point. There is no sign of fever, chills, nausea, vomiting, or diarrhea. Overall I am pleased with how things have progressed I think is very close to being able to transition to his home compression stockings. 12/10/2018 upon evaluation today patient appears to be doing quite well with regard to his left lower extremity. He has been tolerating the dressing changes without complication. Fortunately there is no signs of active infection at this time. He appears after thorough evaluation of his leg to only have 1 small area that remains open at this point everything else appears to be almost completely closed. He still have significant swelling of the left lower extremity. We had discussed discussing this with his primary care provider he is not able to see her in person they were at the [facility] and right now the [place] is not seeing patients on site. According to the patient anyway. Subsequently he did speak with her apparently and his primary care provider feels that he may likely have a DVT. With that being said she has not seen his leg she is just going off of his history. Nonetheless that is a concern that the patient now has as well and while I do not feel the DVT is likely we can definitely ensure that that is not the case I will go ahead and see about putting that order in today. Nonetheless otherwise I am in a recommend that we continue with the current wound care measures including the compression therapy most likely. We just need to ensure that his leg is indeed free of any DVTs. 12/17/2018 on evaluation today patient actually appears to be completely healed today. He does have 2 very small areas of blistering although this is not anything too significant at this point which is good news. With that being said I am in agreement with the fact that I think he is completely healed at this point. He does want to get back into his compression stocking. The  good news is we have gotten approval from insurance for his lymphedema pumps we received a letter since last saw him last week. The other good news is his study did come back and showed no evidence of a DVT. 12/20/2018 on evaluation today patient presents for follow-up concerning his ongoing issues with his left lower extremity. He was actually discharged last Friday and did fairly well until he states blisters opened this morning. He tells me he has been wearing his compression stocking although he has a hard time getting this on. There  does not appear to be any signs of active infection at this time. No fevers, chills, nausea, vomiting, or diarrhea. 12/27/2018 on evaluation today patient appears to be doing very well with regard to his swelling of the left lower extremity the 4 layer compression wrap seems to have been beneficial for him. Fortunately there is no signs of active infection at this time. Patient has been tolerating the compression wrap without complication and his foot swelling in particular appears to be greatly improved. He does still have a wound on the lateral portion of his left leg I believe this is more of a blister that has now reopened. 01/03/2019 on evaluation today patient actually appears to be doing excellent in regard to his left lower extremity. He did receive his compression pumps and is actually use this 7 times since he was last here in the office. On top of the compression wrap he is now roughly 3 cm better at the calf and 2 cm better at the ankle he also states that his foot seem to go an issue better without even having to use a shoe horn. Obviously I think this is all evidence that he is doing excellent in this regard. The other good news is he does not appear to have anything open today as far as wounds are concerned. 01/15/2019 on evaluation today patient appears to be doing more poorly yet again with regard to his left lower extremity. He has developed new wounds  again after being discharged just recently. Unfortunately this continues to be the case that he will heal and then have subsequent new wounds. The last time I was hopeful that he may not end up coming back too quickly especially since he states he has been using his lymphedema pumps along with wearing his compression. Nonetheless he had a blister on the back of his leg that popped up on the left and this has opened up into an ulceration it is quite painful. 01/22/19 on evaluation today patient actually appears to be doing well with regard to his wound on the left lower extremity. He's been tolerating the dressing changes without complication including the compression wrap in the wound appears to be significantly smaller today which is great news. Overall very pleased in this regard. 01/29/2019 on evaluation today patient appears to be doing well with regard to his left posterior lower extremity ulcer. He has been tolerating the dressing changes without complication. This is not completely healed but is getting much closer. We did order a Farrow wrap 4000 for him he has received this and has it with him today although I am not sure we are quite ready to start him on that as of yet. We are very close. 02/05/2019 on evaluation today patient actually appears to be doing quite well with regard to his left posterior lower extremity ulcer. He still has a very tiny opening remaining but the fortunate thing is he seems to be healing quite nicely. He also did get his Farrow wrap which I am hoping will help with his edema control as well at home. Fortunately there is no evidence of active infection. 02/12/2019 patient and fortunately appears to be doing poorly in regard to his wounds of the left lower extremity. He was very close to healing therefore we attempted to use his Velcro compression wraps continuing with lymphedema pumps at home. Unfortunately that does not seem to have done very well for him. He tells me  that he wore them all the time  but again I am not sure why if that is the case that he is having such significant edema. He is still on his fluid pills as well. With that being said there is no obvious sign of infection although I do wonder about the possibility of infection at this time as well. 02/19/2019 unfortunately upon evaluation today patient appears to be doing more poorly with regard to his left lower extremity. He is not showing signs of significant improvement and I think the biggest issue here is that he does have an infection that appears to likely be Pseudomonas. That is based on the blue-green drainage that were noted at this time. Unfortunately the antibiotic that has been on is not going to take care of this at all. I think they will get a need to switch him to either Levaquin or Cipro and this was discussed with the patient. 02/26/2019 on evaluation today patient's lower extremity on the left appears to be doing significantly better as compared to last evaluation. Fortunately there is no signs of active infection at this time. He has been tolerating the compression wrap without complication in fact he made it the whole week at this point. He is showing signs of excellent improvement I am very happy in this regard. With that being said he is having some issues with infection we did review the results of his culture which I noted today. He did have a positive finding for Enterobacter as well as Alcaligenes faecalis. Fortunately the Levaquin that I placed him on will work for both which is great news. There is no signs of systemic infection at this point. 10/30; left posterior leg wound in the setting of very significant edema and what looks like chronic venous inflammation. He has compression pumps but does not use them. We have been using 3 layer compression. Silver alginate to the wound as the primary dressing 03/18/2019 on evaluation today patient appears to be doing a little better  compared to last time I saw him. He really has not been using his compression pumps he tells me that he is having too much discomfort. He has been keeping his wraps on however. He is only been taking his fluid pills every other day because he states they are not really helping and he has an appointment with his primary care provider at the [facility] tomorrow. Subsequently the wound itself on the left lower extremity does seem to be greatly improved compared to previous. JASSON, SIEGMANN ([id number]) 03/25/2019 on evaluation today patient appears to be doing better with regard to his wounds on the bilateral lower extremities. The left is doing excellent the right is also doing better although both still do show some signs of open wounds noted at this point unfortunately. Fortunately there is no signs of active infection at this time. The patient also is not really having any significant pain which is good news. Unfortunately there was some confusion with the referral on vascular disease and as far as getting the patient scheduled there can be contacting him later today to do this fortunately we got this straightened out. 04/01/2019 on evaluation today patient appears to be doing no fevers, chills, nausea, vomiting, or diarrhea. Excellent at this time with regard to his lower extremities. There does not appear to be any open wound at this point which is good news. Fortunately is also no signs of active infection at this time. Overall feel like the patient has done excellent with the compression the problem is every time  we got him to this point and then subsequently go to using his own compression things just go right back to where they were. I am not sure how to address this we can try to get an appointment with vascular for 2 weeks now they have yet to call him. Obviously this has become frustrating for the patient as well. I think the issue has just been an honest error as far as scheduling is concerned but  nonetheless still worn out the point where I am unsure of which direction we should take. 04/08/2019 on evaluation today patient actually appears to be doing well with regard to his lower extremities. There are no open wounds at this time and things seem to be managing quite nicely as far as the overall edema control is concerned. With that being said he does have his compression socks today for [place] to go ahead and reinitiate therapy in that manner at this point. He is going to be going for shoes to be measured on Wednesday and then coincidentally he will also be seeing vascular on Thursday. Overall I think this is good news and again I am hopeful that they will be able to do something for him to help prevent ongoing issues with edema control as well. No fevers, chills, nausea, vomiting, or diarrhea. 04/11/2019 on evaluation today patient actually appears to be doing poorly after just being discharged on Monday of this week. He had been experiencing issues with again blisters especially on the left lower extremity. With that being said he was completely healed and appeared to be doing great this past Monday. He then subsequently has new blisters that formed before his appointment with vascular this morning. He was also measured for shoes in the interim. With that being said we may have figured out what exactly is going on and why he continues to have issues like what we are seeing at this point. He takes his compression stockings off at nighttime and then he ends up having to sleep in his chair for 5-6 hours a night. He sleeps with his feet down he cannot really get him up in the recliner and therefore he is sleeping and the worst possible his position with his feet on the floor for that majority of the time. Again as I explained to him that is about one third at minimum at least one fourth of his day that he spending with his feet dangling down on the ground and the worst possible position they could  be. I think this may be what is causing the issue. Subsequently I am leaning toward thinking that he may need a hospital bed in order to elevate his legs. We likely can have to coordinate this with his primary care provider at the [facility]. Readmission: 01/26/2021 this is a patient who presents for repeat evaluation here in the clinic although it is actually been couple of years since have seen him in fact it was December 2020 when I last saw him. Subsequently he never really healed but did end up being lost to follow-up. He tells me has been having issues ongoing with his lower extremities has bilateral lower extremity lymphedema no real significant or definitive open wounds but in general his lymphedema is way out of control. We were never able to refer him to lymphedema clinic simply due to the fact to be honest we were never able to get him completely healed. I do not see anyone with open wounds. The patient does have  evidence of type 2 diabetes mellitus, lymphedema, chronic venous insufficiency, and hypertension. That really has not changed since his last evaluation. 02/09/2021 upon evaluation today patient appears to be doing a little better in regard to his legs although he still having a tremendous amount of drainage especially on the left leg. Fortunately there does not appear to be any evidence of active infection. Of note when we looked into this further it appears that the patient did not have any absorptive dressing on it was just the 4-layer compression wrap. Nonetheless this is probably big part of the issue here. 10/10; he comes in today with 3 large areas on the upper right lower leg likely remanence of denuded blistering under his compression wraps. He has no other wounds on the right. On the left he has the denuded area on the left medial foot and ankle and on the left dorsal foot. Massive lymphedema in both feet dorsally. Using Zetuvit under compression We have increased home  health visitation to twice a week to change the dressings and will change it once 02/22/2021 upon evaluation today patient appears to be doing well currently with regard to his wounds. He has been tolerating the dressing changes without complication. Fortunately there does not appear to be any evidence of active infection which is great news. No fevers, chills, nausea, vomiting, or diarrhea. The biggest issue I see currently is that home health is not putting any medicine on the actual wounds before wrapping. 03/01/2021 upon evaluation today the patient's right leg actually appears to be doing quite well which is great news there does not appear to be any evidence of active infection at this time. No fevers, chills, nausea, vomiting, or diarrhea. With that being said the patient is having issues on the left foot where he is having significant drainage is also an ammonia smell he does not have any animals at home and this makes me concerned about a bacteria producing urea as a byproduct. Again the possible common organisms will be E. coli, Proteus, and Enterococcus. All 3 of which can be successfully treated with Levaquin. For that reason I think that this may be a good option for [place] to consider placing him on and I did obtain a culture as well for confirmation sake. 03/08/2021 upon evaluation today patient appears to be doing unfortunately still somewhat poorly in regard to his leg ulcerations. He actually has an area on the right leg where he blistered due to the fact that his wrap slid down and caused an area of pinching on his skin and this has led to a significant issue here. 03/15/2021 upon evaluation today patient unfortunately has not been wrapped appropriately with absorptive dressings nor with the appropriate technique for the third layer of the 4-layer compression wrap. These are issues that we continue to try to address with the home health nurse. Also the absorptive dressing that she had  was cut in half and therefore that causes things to leak out it does not actually trap the fluid in regard to the top of the foot overall I think that all these combined are really not seeing things improved significantly here. Fortunately there does not appear to be any signs of significant infection at this time which is good news. He still is having a tremendous amount of drainage. 03/22/2021 upon evaluation today patient appears to be draining tremendously. He still continues to tell me that he is using his pumps 2 times a day and that coupled with that  tells me that he is elevating his legs as well. With that being said all things considered I am really just not seeing the improvement we would expect to see with the 4-layer compression wrap and all the above noted. He in fact had an extremely large Zetuvit dressing on both legs and that they were extremely filled to the max with fluid. This is after just being changed just before the weekend and this AADAM, ZHEN ([id number]) is Monday. Nonetheless I am concerned about the fact that there is something going on fairly significant that we cannot get any of this under control and that he is draining this significantly. He supposed be having an echocardiogram it sounds like scheduling has been an issue for him as far as getting in sooner. Its something to do with needing his cousin to drive him because of where it sat and he cannot drive himself to this appointment either way I really think he needs to try to see what he can do about making this happen a little sooner. He tells me he will call today. 03/29/2021 on evaluation today patient appears to be doing about the same in regard to his legs. He did get his cardiology appointment moved up to 6 December which is at least good that is better than what it was before mid December. Overall very pleased in that regard. 04/05/2021 upon evaluation today patient unfortunately is still doing fairly poorly.  There does not appear to be any signs of active infection at this time. No fevers, chills, nausea, vomiting, or diarrhea. Unfortunately I think until his edema is under control and overall fluid overload there is really not to be much chance that I can do much to get him better. This is quite unfortunate and frustrating both for myself and the patient to be perfectly honest. Nonetheless I think that he really needs to have a conversation both with his primary care provider as well as cardiologist he sees the PCP on Monday and cardiology on Wednesday of next week. 04/13/2021 upon evaluation today patient appears to be doing poorly in regard to his bilateral lower extremities his left is still worse than the right. With that being said he has a tremendous amount of drainage he did see his primary care provider yesterday there really was not much there to be done from their perspective. He sees cardiology tomorrow. Nonetheless my biggest concern here is simply that if we do not get the edema under control he is going to continue to have drainage and honestly I think at some point he is going to become infected severely that is my main concern. 04/19/2021 upon evaluation today patient appears to be doing poorly still in regard to his legs. Unfortunately there does not appear to be any signs of infection at this point. He does have a tremendous amount of drainage however. We have not seen the results back from the cardiologist and the echocardiogram that was done. It appears that the patient checked out okay as far as that is concerned with regard to ejection fraction though we still have some issues here to be honest with his diastolic function. I am unsure if this is accounting for everything that we are seeing or not. Either way he has a tremendous amount of drainage from his legs that we are just not able to control in the outpatient setting at this point. I have reached out to Dr. Rockey Situ his  cardiologist to see once he reviews the sheet if  there is anything that he feels like can be done from an outpatient perspective if not then I think the way to go is probably can to be through inpatient admission and diuresis. Otherwise I am not sure how working to get this under control we tried antibiotics, compression wrapping, and I have told the patient to be elevating his legs I am not sure how much he does of this but either way I think that this is still an ongoing issue nonetheless. 04/26/2021 upon evaluation today patient appears to be doing poorly in regard to his legs. He is having a tremendous amount of fluid at this point which is quite unfortunate. Its to the point that he may have had at least 5 to 10 pounds of fluid in his dressings this morning when they were removed these were changed this Friday. Subsequently I think he needs to go to the ER for further evaluation and treatment I think is probably can need diuresis possibly even IV antibiotics been on what the blood work looks like but in general I feel like he needs something to get this under control from an outpatient perspective absent of everywhere I can think of and I cannot get this under control with our traditional measures. I think this is going require more so that we can get him better 12/30; this is a patient with severe bilateral lymphedema. He was hospitalized from 04/26/2021 through 04/29/2021 treated for cellulitis in the setting of lower extremity ulcers and lymphedema. After he left the hospital he is apparently seen for nurse visit our staff contacted cardiology and he has been started on Lasix 40 mg. Apparently his legs have less edema. Lab work from 05/04/2021 showed a BUN of 38 and creatinine of 1.59 these are elevated versus previous where his creatinine seems to have been 1.30 on 12/19 his potassium is 4.3. I believe the lab work is being followed by cardiology We have him in a 4-layer wrap. Xeroform on the  leg wounds and sit to fit on the Berry damage skin on the left dorsal foot versus right dorsal foot. He has compression pumps but does not use them. We have apparently not yet ordered him compression stockings 05/17/2021 upon evaluation today patient's legs though better than last time I personally saw him appear to be getting worse compared to where they were previous. Dr. Quentin Cornwall was actually last 1 to see you I have not seen him since 19 December. That was before he went into the ER. Coming out apparently his legs looked also and they still look better but not as good as they were in the past. 1/16; patient with severe bilateral lymphedema. Severe scaled hyperkeratotic skin on the dorsal aspect of his distal left foot and left medial ankle.. On the right side changes are not as bad. He did not have any weeping edema. Our intake nurse was convinced that he is being compliant with compression pumps 1 hour twice a day 05/31/2021 upon evaluation today patient actually appears to be doing a little bit better in my opinion in regard to his feet. I do not see as much drainage and it being just completely wet as it was previous. Fortunately I do not also see any signs of active infection which is great news as well. 06/07/21 Upon inspection patient's wound bed actually showed signs of doing well he is not nearly as weepy and wet as he has been in the past and overall very pleased in that regard. Fortunately  I do not see any signs of active infection locally or nor systemically at this time. Which is great news. No fevers, chills, nausea, vomiting, or diarrhea. 06/14/2021 upon evaluation today patient appears to be doing well with regard to his right foot I am pleased in that regard. His left foot is still draining quite a bit despite using lymphedema pumps, 4-layer compression wraps, and he tells me elevating his legs as well. He also has Lasix that he takes twice a day. Nonetheless I believe that this is  still good to be an ongoing issue. We have a hard time getting this under control as far as the swelling is concerned. 06/21/2021 upon evaluation today patient appears to be doing decently well in regard to his wounds all things considered. He still has a tremendous amount of drainage and fluid noted at this point. Fortunately I do not see any signs of active infection locally or systemically at this point which is great news. Nonetheless I am unsure where to go and how to do this as far as trying to limit his swelling and weeping from his toes in particular. 06/28/2021 upon evaluation patient unfortunately continues to have significant drainage from his feet. We have been keeping him in a compression wrap and despite this he still continues to have extreme fluid issues he seen his cardiologist he is seeing the nephrologist. We really cannot find any way to get this under control when he did well was when he was in the hospital and they got some of the fluid away. But outside of that we are just struggling to achieve the long-term goal of getting this under control and keeping it under control unfortunately. 07/05/2021 upon evaluation today patient appears to be doing poorly in regard to his feet. Unfortunately this continues to be a significant issue and to be honest I am really not certain what to do about it. I referred him to Dr. Randol Kern at [facility] and he does have an appointment although it is SERAJ, [name] ([id number]) 12 April. He also sees his primary care provider on 6 April. He did not want to see Dr. Haynes Kerns until after he saw his PCP that is the reason the appointment so far out. There is really not much I can do in that regard. Nonetheless I do think that we are still continue to have significant lymphedema issues with significant mount of weeping in regard to the feet and again this has just become extremely difficult to manage to be honest I am not sure if there is something else that Dr.  Haynes Kerns or someone else could recommend he also will be seeing Dr. Dellia Nims in 2 weeks when I am on vacation and at that time I will see if Dr. Dellia Nims has any ideas about where to go from here in the meantime. 07/12/2021 upon evaluation today patient appears to be doing about as well as can be expected with regard to his feet. He does actually see his kidney doctor this Friday. He also will be seeing his primary care provider on April 4 and then following that around mid April he will be seeing Dr. Haynes Kerns at [facility] which was a referral made for him. Again my goal is to try to find out some way to fix this and to be perfectly honest we have had some issues with making any good adjustments. When he was in the hospital and greater amounts of Lasix he was able to get this down and it looked much  better upon discharge. With that being said right now things just are not doing nearly as good as what they used to be. 3/13; patient presents for follow-up. He has been using his lymphedema pumps over the past week. He reports an increase in his Lasix dose. He has no issues or complaints today. He denies signs of infection. 07/26/2021 upon evaluation today patient appears to be doing better in regard to his feet bilaterally. Both are showing signs of much less drainage which is great news and overall very pleased in that regard. Fortunately there does not appear to be any evidence of active infection locally or systemically at this time. No fevers, chills, nausea, vomiting, or diarrhea. 08/02/2021 upon evaluation today patient appears to be doing well with regard to his feet. Both are showing signs of being drier the right pretty much has not really draining much at all which is great news. The left is not draining anywhere close to his much as it was during the last evaluation. This is excellent news and overall very pleased. 08/09/2021 upon evaluation today patient appears to be doing well with regard to his legs the  right leg especially showing signs of excellent improvement which is great news I do not see any evidence of active infection locally or systemically which is great. In regard to the left leg he still has some weeping and drainage but nothing as significant as what it was in the past this is great news. 08-16-2021 upon evaluation today patient appears to actually be doing quite well in my opinion in regard to his feet. This is significantly improved compared to what we previously seen and overall I am extremely pleased in that regard. I do believe that He is actually improving although this is obviously very slow going. 08-23-2021 upon evaluation today patient appears to be doing well currently in regard to his right leg which actually is pretty dry at this point today. Fortunately I do not see any evidence of active infection at this time which is great news. No fevers, chills, nausea, vomiting, or diarrhea. 08-30-2021 upon evaluation today patient appears to be doing well with regard to his lower extremities. The right foot is pretty much completely dry which is great news the left foot though not completely dry seems to be doing decently well. I do not see any signs of active infection locally or systemically which is great news. No fevers, chills, nausea, vomiting, or diarrhea. 09-06-2021 upon evaluation today patient appears to be doing well with regard to his feet in fact now the left foot is almost completely dry as well and I am definitely seeing a lot of significant improvement. With that being said unfortunately he actually appears to have some cellulitis of his right thigh. His toes are also little bit red but this may just be due to the increased swelling. He really is not warm to touch in regard to the toes. 5/8; excellent edema control on the right foot and lower leg there is no open wounds but we continue to put compression on this otherwise this will breakdown. He is using his compression  pumps twice a day at home The area that is problematic is on the left dorsal foot some areas that are not fully epithelialized with very dry fissured skin over this area. 09-20-2021 upon evaluation today patient appears to be doing a little bit worse in regard to swelling at this time. Fortunately I do not see any signs of infection with that  being said the wrap was not on quite as well as what I would like to have seen. I do believe that this has caused a little bit of excess swelling and again we need to try to get this under good control. 09-27-2021 upon evaluation today patient appears to be doing awesome in regard to his feet and legs. Everything is measuring smaller the swelling is down and to be perfectly honest I am extremely pleased with the end of his feet especially on the left side and how dry this is today. I do think we are on the right track here. 5/31; patient presents for follow-up. He is using nystatin powder to the feet bilaterally under 4-layer compression. He has no issues or complaints today. He states he is going to the lymphedema clinic tomorrow. Since he has no open wounds on the right lower extremity they will be focused on the side. 10-11-2021 upon evaluation today patient appears to be doing excellent in regard to his legs. He has been tolerating the dressing changes without complication. Fortunately there does not appear to be any evidence of active infection locally or systemically at this time which is great news. No fevers, chills, nausea, vomiting, or diarrhea. 10-18-2021 upon evaluation today patient appears to be doing well with regard to his legs. He has been tolerating the dressing changes without complication and actually seen in lymphedema clinic now in regard to his right leg were taken care of the left leg. Fortunately I do not see any evidence of active infection locally or systemically at this time. Objective Constitutional [name], [name]  ([id number]) Well-nourished and well-hydrated in no acute distress. Vitals Time Taken: 2:05 PM, Weight: 312 lbs, Temperature: 97.6 F, Pulse: 60 bpm, Respiratory Rate: 18 breaths/min, Blood Pressure: 131/71 mmHg. Respiratory normal breathing without difficulty. Psychiatric this patient is able to make decisions and demonstrates good insight into disease process. Alert and Oriented x 3. pleasant and cooperative. General Notes: Upon inspection patient's wound bed showed evidence of good granulation epithelization at this point. Fortunately I do not see any evidence of infection which is great news and overall I think you are on the right track. He has foot is extremely small compared to what it used to be. 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 Post procedure Diagnosis Wound #: Same as Pre-Procedure Plan Follow-up Appointments: Return Appointment in 1 week. Nurse Visit as needed Other: Bathing/ Shower/ Hygiene: May shower with wound dressing protected with water repellent cover or cast protector. No tub bath. Edema Control - Lymphedema / Segmental Compressive Device / Other: Optional: One layer of unna paste to top of compression wrap (to act as an anchor). - Unna paste on calf to secure wrap in place as needed 4 Layer Compression System Lymphedema. - left lower leg -q week bi lat, nystatin powder between toes on bilateral toes, 4x4 gauze between toes only needed on left side, apply zetuvits/ or  formulary for extra absorbant dressing around ankles and foot area, use ABD to cover toes; tubi grip C to foot and ankle COTTON LAYER SPIRAL, LIGHT TAN  SPIRAL. WHITE WITH YELLOW LINE FIGURE 8 , COBAN SPIRAL Tubigrip double layer applied - size C left foot ankle foods Elevate, Exercise Daily and Avoid Standing for Long Periods of Time. Elevate legs to the level of the heart and pump ankles as often as possible Elevate leg(s) parallel to the floor when sitting. Compression Pump: Use compression pump on left lower extremity for 60 minutes, twice daily. - 2 times per day DO YOUR BEST to sleep in the bed at night. DO NOT sleep in your recliner. Long hours of sitting in a recliner leads to swelling of the legs and/or potential wounds on your backside. Other: - Contact prescriber regarding use of diuretics to reduce fluid overload. Off-Loading: Turn and reposition every 2 hours Additional Orders / Instructions: Follow Nutritious Diet and Increase Protein Intake [name], [name] ([id number]) 1. I would recommend that we go ahead and continue with the wound care measures as before and the patient is in agreement with plan. This includes the use of the 4-layer compression wrap which I think is doing a good job. 2. Were using Tubigrip which will get a switch to a size C over top of the toes and the top of the foot to get some extra compression and get this down to a smaller size. It is working very well and overall worsening and dramatic improvement here in the end of his foot compared to where things have been. 3. I am going to recommend as well the patient continue to elevate his legs much as possible he is also playing drums now per the recommendation of the lymphedema clinic which is helping to keep his swelling down to the muscles in the calf region. We will see patient back for reevaluation in 1 week here in the clinic. If anything worsens or changes patient will contact our office for  additional recommendations. Electronic Signature(s) Signed: 10/18/2021 2:29:54 PM By: Worthy Keeler PA-C Entered By: Worthy Keeler on 10/18/2021 14:29:54 [name] ([id number]) -------------------------------------------------------------------------------- SuperBill Details Patient Name: [name] Date of Service: 10/18/2021 Medical Record Number: [id number] Patient Account Number: [id number] Date of Birth/Sex: 07/10/1950 (71 y.o. M) Treating RN: [name] Primary Care Provider: Fontaine No Other Clinician: Referring Provider: Fontaine No Treating Provider/Extender: Skipper Cliche in Treatment: 37 Diagnosis Coding ICD-10 Codes Code Description E11.622 Type 2 diabetes mellitus with other skin ulcer I89.0 Lymphedema, not elsewhere classified I87.2 Venous insufficiency (chronic) (peripheral) L97.822 Non-pressure chronic ulcer of other part of left lower leg with fat layer exposed L97.512 Non-pressure chronic ulcer of other part of right foot with fat layer exposed I10 Essential (primary) hypertension L97.811 Non-pressure chronic ulcer of other part of right lower leg limited to breakdown of skin Facility Procedures CPT4 Code: [id number] Description: (Facility Use Only) [place] [facility] LEG Modifier: Quantity: 1 Electronic Signature(s) Signed: 10/18/2021 3:46:28 PM By: Worthy Keeler PA-C Signed: 10/19/2021 2:49:16 PM By: [name] RN Previous Signature: 10/18/2021 2:36:54 PM Version By: Worthy Keeler PA-C Entered By: [name] on 10/18/2021 14:46:18

## 2021-10-19 ENCOUNTER — Encounter: Payer: Self-pay | Admitting: Cardiovascular Disease

## 2021-10-19 NOTE — Progress Notes (Signed)
TREVONTE, ASHKAR ([id number]) Visit Report for 10/18/2021 Arrival Information Details Patient Name: [name], [name] Date of Service: 10/18/2021 2:00 PM Medical Record Number: [id number] Patient Account Number: [id number] Date of Birth/Sex: 1950/11/10 (71 y.o. M) Treating RN: [name] Primary Care Lenix Benoist: Fontaine No Other Clinician: Referring Jenean Escandon: Fontaine No Treating Dael Howland/Extender: Skipper Cliche in Treatment: 63 Visit Information History Since Last Visit All ordered tests and consults were completed: No Patient Arrived: [name] Added or deleted any medications: No Arrival Time: 14:04 Any new allergies or adverse reactions: No Accompanied By: self Had a fall or experienced change in No Transfer Assistance: None activities of daily living that may affect Patient Identification Verified: Yes risk of falls: Secondary Verification Process Completed: Yes Signs or symptoms of abuse/neglect since last visito No Patient Requires Transmission-Based No Hospitalized since last visit: No Precautions: Implantable device outside of the clinic excluding No Patient Has Alerts: Yes cellular tissue based products placed in the center Patient Alerts: AVVS consult on file since last visit: Last ABI-R 1.09; L Has Dressing in Place as Prescribed: Yes 1.04 Pain Present Now: No Electronic Signature(s) Signed: 10/19/2021 2:49:16 PM By: [name] RN Entered By: [name] on 10/18/2021 14:05:17 [name] ([id number]) -------------------------------------------------------------------------------- Clinic Level of Care Assessment Details Patient Name: [name] Date of Service: 10/18/2021 2:00 PM Medical Record Number: [id number] Patient Account Number: [id number] Date of Birth/Sex: May 29, 1950 (71 y.o. M) Treating RN: [name] Primary Care Eldredge Veldhuizen: Fontaine No Other Clinician: Referring Kiven Vangilder: Fontaine No Treating Quintasha Gren/Extender:  Skipper Cliche in Treatment: 37 Clinic Level of Care Assessment Items TOOL 1 Quantity Score _0  - Use when EandM and Procedure is performed on INITIAL visit 0 ASSESSMENTS - Nursing Assessment / Reassessment _1  - General Physical Exam (combine w/ comprehensive assessment (listed just below) when performed on new 0 pt. evals) _2  - 0 Comprehensive Assessment (HX, ROS, Risk Assessments, Wounds Hx, etc.) ASSESSMENTS - Wound and Skin Assessment / Reassessment _3  - Dermatologic / Skin Assessment (not related to wound area) 0 ASSESSMENTS - Ostomy and/or Continence Assessment and Care _4  - Incontinence Assessment and Management 0 _5  - 0 Ostomy Care Assessment and Management (repouching, etc.) PROCESS - Coordination of Care _6  - Simple Patient / Family Education for ongoing care 0 _7  - 0 Complex (extensive) Patient / Family Education for ongoing care _8  - 0 Staff obtains Programmer, systems, Records, Test Results / Process Orders _9  - 0 Staff telephones HHA, Nursing Homes / Clarify orders / etc _10  - 0 Routine Transfer to another Facility (non-emergent condition) _11  - 0 Routine Hospital Admission (non-emergent condition) _12  - 0 New Admissions / Biomedical engineer / Ordering NPWT, Apligraf, etc. _13  - 0 Emergency Hospital Admission (emergent condition) PROCESS - Special Needs _14  - Pediatric / Minor Patient Management 0 _15  - 0 Isolation Patient Management _16  - 0 Hearing / Language / Visual special needs _17  - 0 Assessment of Community assistance (transportation, D/C planning, etc.) _18  - 0 Additional assistance / Altered mentation _19  - 0 Support Surface(s) Assessment (bed, cushion, seat, etc.) INTERVENTIONS - Miscellaneous _20  - External ear exam 0 _21  - 0 Patient Transfer (multiple staff / Civil Service fast streamer / Similar devices) _22  - 0 Simple Staple / Suture removal (25 or less) _23  - 0 Complex Staple / Suture removal (26 or more) _24  - 0 Hypo/Hyperglycemic Management (do not check if billed  separately) _25  - 0 Ankle / Brachial Index (ABI) - do not check if billed separately Has the patient been seen at the hospital within the last  three years: Yes Total Score: 0 Level Of Care: ____ [name] ([id number]) Electronic Signature(s) Signed: 10/19/2021 2:49:16 PM By: [name] RN Entered By: [name] on 10/18/2021 14:26:28 [name] ([id number]) -------------------------------------------------------------------------------- Compression Therapy Details Patient Name: [name] Date of Service: 10/18/2021 2:00 PM Medical Record Number: [id number] Patient Account Number: [id number] Date of Birth/Sex: Jul 20, 1950 (71 y.o. M) Treating RN: [name] Primary Care Simpson Paulos: Fontaine No Other Clinician: Referring Meg Niemeier: Fontaine No Treating Agata Lucente/Extender: Skipper Cliche in Treatment: 37 Compression Therapy Performed for Wound Assessment: NonWound Condition Lymphedema - Left Leg Performed By: Clinician [name], RN Compression Type: Four Layer Post Procedure Diagnosis Same as Pre-procedure Electronic Signature(s) Signed: 10/19/2021 2:49:16 PM By: [name] RN Entered By: [name] on 10/18/2021 14:26:17 [name] ([id number]) -------------------------------------------------------------------------------- Encounter Discharge Information Details Patient Name: [name] Date of Service: 10/18/2021 2:00 PM Medical Record Number: [id number] Patient Account Number: [id number] Date of Birth/Sex: 07-Jun-1950 (71 y.o. M) Treating RN: [name] Primary Care Amillion Macchia: Fontaine No Other Clinician: Referring Malakie Balis: Fontaine No Treating Darsh Vandevoort/Extender: Skipper Cliche in Treatment: 37 Encounter Discharge Information Items Discharge Condition: Stable Ambulatory Status: Cane Discharge Destination: Home Transportation: Private Auto Accompanied By: self Schedule Follow-up Appointment: Yes Clinical Summary  of Care: Electronic Signature(s) Signed: 10/19/2021 2:49:16 PM By: [name] RN Entered By: [name] on 10/18/2021 14:46:59 [name] ([id number]) -------------------------------------------------------------------------------- Lower Extremity Assessment Details Patient Name: [name] Date of Service: 10/18/2021 2:00 PM Medical Record Number: [id number] Patient Account Number: [id number] Date of Birth/Sex: 05-19-1950 (71 y.o. M) Treating RN: [name] Primary Care Alexiah Koroma: Fontaine No Other Clinician: Referring Delynn Pursley: Fontaine No Treating Browning Southwood/Extender: Skipper Cliche in Treatment: 37 Edema Assessment Assessed: [Left: No] [Right: No] Edema: [Left: Ye] [Right: s] Calf Left: Right: Point of Measurement: 38 cm From Medial Instep 36 cm Ankle Left: Right: Point of Measurement: 14 cm From Medial Instep 24 cm Knee To Floor Left: Right: From Medial Instep 48 cm Vascular Assessment Pulses: Dorsalis Pedis Palpable: [Left:Yes] Electronic Signature(s) Signed: 10/19/2021 2:49:16 PM By: [name] RN Entered By: [name] on 10/18/2021 14:17:04 [name] ([id number]) -------------------------------------------------------------------------------- Multi Wound Chart Details Patient Name: [name] Date of Service: 10/18/2021 2:00 PM Medical Record Number: [id number] Patient Account Number: [id number] Date of Birth/Sex: 1950/06/05 (71 y.o. M) Treating RN: [name] Primary Care Eliazar Olivar: Fontaine No Other Clinician: Referring Adonnis Salceda: Fontaine No Treating Amulya Quintin/Extender: Skipper Cliche in Treatment: 37 Vital Signs Height(in): Pulse(bpm): 60 Weight(lbs): 312 Blood Pressure(mmHg): 131/71 Body Mass Index(BMI): Temperature(F): 97.6 Respiratory Rate(breaths/min): 18 Wound Assessments Treatment Notes Electronic Signature(s) Signed: 10/19/2021 2:49:16 PM By: [name] RN Entered By: [name] on  10/18/2021 [place], [place] ([id number]) -------------------------------------------------------------------------------- [place] Details Patient Name: [name] Date of Service: 10/18/2021 2:00 PM Medical Record Number: [id number] Patient Account Number: [id number] Date of Birth/Sex: [birth year] (71 y.o. M) Treating RN: [name] Primary Care Amariz Flamenco: Fontaine No Other Clinician: Referring Chester Sibert: Fontaine No Treating Justo Hengel/Extender: Skipper Cliche in Treatment: 37 Active Inactive Soft Tissue Infection Nursing Diagnoses: Impaired tissue integrity Knowledge deficit related to disease process and management Knowledge deficit related to home infection control: handwashing, handling of soiled dressings, supply storage Potential for infection: soft tissue Goals: Patient/caregiver will verbalize understanding of or measures to prevent infection and contamination in the home setting Date Initiated: 09/06/2021 Date Inactivated: 10/18/2021 Target Resolution Date: 09/06/2021 Goal Status: Met Patient's soft tissue infection will resolve Date Initiated: 09/06/2021 Date Inactivated: 10/18/2021 Target Resolution Date: 09/06/2021 Goal Status: Met Signs and symptoms  of infection will be recognized early to allow for prompt treatment Date Initiated: 09/06/2021 Target Resolution Date: 10/29/2021 Goal Status: Active Interventions: Assess signs and symptoms of infection every visit Provide education on infection Treatment Activities: Education provided on Infection : 10/11/2021 Systemic antibiotics : 09/06/2021 Notes: Electronic Signature(s) Signed: 10/19/2021 2:49:16 PM By: [name] RN Entered By: [name] on 10/18/2021 14:17:31 [name] ([id number]) -------------------------------------------------------------------------------- Pain Assessment Details Patient Name: [name] Date of Service: 10/18/2021 2:00 PM Medical Record  Number: [id number] Patient Account Number: [id number] Date of Birth/Sex: October 28, 1950 (71 y.o. M) Treating RN: [name] Primary Care Alexande Sheerin: Fontaine No Other Clinician: Referring Maleya Leever: Fontaine No Treating Jazarah Capili/Extender: Skipper Cliche in Treatment: 37 Active Problems Location of Pain Severity and Description of Pain Patient Has Paino No Site Locations Pain Management and Medication Current Pain Management: Electronic Signature(s) Signed: 10/19/2021 2:49:16 PM By: [name] RN Entered By: [name] on 10/18/2021 14:05:52 [name] ([id number]) -------------------------------------------------------------------------------- Patient/Caregiver Education Details Patient Name: [name] Date of Service: 10/18/2021 2:00 PM Medical Record Number: [id number] Patient Account Number: [id number] Date of Birth/Gender: 1950-12-21 (71 y.o. M) Treating RN: [name] Primary Care Physician: Fontaine No Other Clinician: Referring Physician: Fontaine No Treating Physician/Extender: Skipper Cliche in Treatment: 75 Education Assessment Education Provided To: Patient Education Topics Provided Infection: Methods: Explain/Verbal Responses: State content correctly Electronic Signature(s) Signed: 10/19/2021 2:49:16 PM By: [name] RN Entered By: [name] on 10/18/2021 14:46:28 [name] ([id number]) -------------------------------------------------------------------------------- [place] Details Patient Name: [name] Date of Service: 10/18/2021 2:00 PM Medical Record Number: [id number] Patient Account Number: [id number] Date of Birth/Sex: 04/29/1951 (71 y.o. M) Treating RN: [name] Primary Care Rodolphe Edmonston: Fontaine No Other Clinician: Referring Pauletta Pickney: Fontaine No Treating Breindy Meadow/Extender: Skipper Cliche in Treatment: 37 Vital Signs Time Taken: 14:05 Temperature (F): 97.6 Weight  (lbs): 312 Pulse (bpm): 60 Respiratory Rate (breaths/min): 18 Blood Pressure (mmHg): 131/71 Reference Range: 80 - 120 mg / dl Electronic Signature(s) Signed: 10/19/2021 2:49:16 PM By: [name] RN Entered By: [name] on 10/18/2021 14:05:44

## 2021-10-20 ENCOUNTER — Ambulatory Visit: Payer: PPO | Admitting: Occupational Therapy

## 2021-10-20 DIAGNOSIS — I890 Lymphedema, not elsewhere classified: Secondary | ICD-10-CM

## 2021-10-20 NOTE — Therapy (Signed)
[place] MAIN [facility] SERVICES [address] [place], [place], [address] Phone: [phone]   Fax:  [phone]  Occupational Therapy Treatment  Patient Details  Name: [name] MRN: [id number] Date of Birth: Jul 22, 1950 Referring Provider (OT): Jeri Cos, [place]   Encounter Date: 10/20/2021   OT End of Session - 10/20/21 1302     Visit Number 3    Number of Visits 36    Date for OT Re-Evaluation 01/12/22    OT Start Time 0100    OT Stop Time 0200    OT Time Calculation (min) 60 min    Activity Tolerance Patient tolerated treatment well;No increased pain    Behavior During Therapy WFL for tasks assessed/performed             Past Medical History:  Diagnosis Date   Diabetes mellitus without complication ([place])    Hyperlipidemia    Hypertension    Leukemia ([place])     Past Surgical History:  Procedure Laterality Date   CATARACT EXTRACTION Right    TONSILLECTOMY     TOOTH EXTRACTION      There were no vitals filed for this visit.   Subjective Assessment - 10/20/21 1316     Subjective  [name] presents to Occupational Therapy for CDT to BLE. Pt is accompanied by his cousin , Baker Janus, who tells me she applied compression wrapping between visits without difficulty.  Pt denies LE related leg pain this morning.    Patient is accompanied by: Family member   cousin, Baker Janus, drove Pt to clinic and attended appt   Pertinent History Relevant to lymphedema: No presence of varicose veins or arterial sufficiency, and DVT and ruled out by Ronita Hipps, MD at [facility]. Pt declined venogram offered by Dr. Haynes Kerns. . Pt has a basic sequential pneumatic compression device, or "pump" , set at 40 mmHg, uses 2 x daily by report. Marland Kitchen PMH significant for leukemia, HTN, PVD, CAD and CHF. Pt has DM with neuropathy. CVI CKD, Sage 3a. Tobacco abuse disorder- quit 2010.    Limitations Impaired sensation ; increased infection risk, increased falls risk,  iimpaired dynamic balance, difficulty walking, non-healing leg wounds, chronic, progressive , BLE lymphedema, impaired hip, knee and ankle AROM; impaired basic and instrumental ADLs ( bathing, grooming, dressing, skin inspection, shopping, home management, sleeping, driving, functional ambulation and transfers), impaired work and productive activities, impaired leisure pursuits (no longer able to gather with friends to play music; sedentary lifestyle) impaired social participation    Special Tests + Stemmer sign bilaterally, Intake FOTO 56/100%    Currently in Pain? No/denies    Pain Onset --   leg pain onset s/p 5 years w onset of swelling                         OT Treatments/Exercises (OP) - 10/20/21 1613       ADLs   ADL Education Given Yes      Manual Therapy   Manual Therapy Edema management;Compression Bandaging;Manual Lymphatic Drainage (MLD)    Manual Lymphatic Drainage (MLD) MLD and simultaneous skin care to LLE    Compression Bandaging multilayer, gradient compression wraps utilizing one ach 8 and 10 cm wide short stretch bandages over cotton stockinett and 0.4 cm thick Rosidal foam from base of toes to tibial tuberosity                    OT Education - 10/20/21  1614     Education Details Emphasis of Pt edu on multilayer short stretch compression wrapping vs long stretch wraps. How short stretch wraps move  lymphatic congestion by accentuating pumping action of leg muscles. Demonstrated application and educated on wear and care routines.    Person(s) Educated Patient;Other (comment)   cousin Baker Janus   Methods Explanation;Demonstration    Comprehension Verbalized understanding;Returned demonstration;Need further instruction                 OT Long Term Goals - 10/15/21 1228       OT LONG TERM GOAL #1   Title Given this patient's Intake score of 56/100% on the functional outcomes FOTO tool, patient will experience an increase in function of 3  points to 59, to improve basic and instrumental ADLs performance, including lymphedema self-care.    Baseline Max A    Time 12    Period Weeks    Status New    Target Date 01/13/22      OT LONG TERM GOAL #2   Title Pt will demonstrate understanding of lymphedema precautions and prevention strategies with modified independence using a printed reference to identify at least 5 precautions and discussing how s/he may implement them into daily life to reduce risk of progression and to limit infection risk.    Baseline Max A    Time 4    Period Days    Status New    Target Date --   4th OT Rx visit     OT LONG TERM GOAL #3   Title With Maximum caregiver assistance Pt will be able to apply multilayer, knee length, compression wraps using gradient techniques from base of toes to groin to decrease limb volume, to limit infection risk, and to limit lymphedema progression. Pt requires Max CG assist to achieve this goal b/c he is unable to reach his feet and toes.    Baseline Dependent    Time 12    Period Weeks    Status New    Target Date 01/13/22      OT LONG TERM GOAL #4   Title Pt will achieve at least a 10% limb volume reduction in BLE from ankle to groin to return limb to more normal size and shape, to limit infection risk, to decrease pain, to improve AROM and function, and to limit lymphedema progression.    Baseline dependent    Time 12    Period Weeks    Status New    Target Date 01/13/22      OT LONG TERM GOAL #5   Title Max caregiver assistance is essential in this case to complete Intensive and Self-management Phases of Complete Decongestive Therapy, including the Lymphedema self-care home program b/c Pt is unable to reach his feet to apply compression wraps and garments, to perform skin  and nail care and inspection and to perform simple self-MLD. Pt agrees to arrange for consistent daily caregiver prior to commencing CDT.    Baseline dependent    Time 1    Period Days     Status New    Target Date --   initial Rx appointment     Long Term Additional Goals   Additional Long Term Goals Yes      OT LONG TERM GOAL #6   Title With Max caregiver assistance Pt will achieve and sustain no less than 85% compliance with all LE self-care home program components throughout CDT, including modified simple self-MLD, daily skin care  and inspection, lymphatic pumping the ex and appropriate compression to limit lymphedema progression and to limit further functional decline.    Baseline dependent    Time 12    Period Weeks    Status New    Target Date 01/13/22                   Plan - 10/20/21 1611     Clinical Impression Statement Commenced RLE/RLQ MLD utilizing fgunctional inguinal waterdshed. Pt tolerated MLD and simultaneous skin care with castor oil without increased pain or irritation to skin. . Continued teaching Pt and caregiver skin care and bandaging regimes and techniques. Good session.  Cont as per OC.    OT Occupational Profile and History Detailed Assessment- Review of Records and additional review of physical, cognitive, psychosocial history related to current functional performance    Occupational performance deficits (Please refer to evaluation for details): ADL's;IADL's;Work;Leisure;Social Participation    Body Structure / Function / Physical Skills ADL;Edema;Skin integrity;Pain;IADL;Sensation    Rehab Potential Fair    Clinical Decision Making Multiple treatment options, significant modification of task necessary    Comorbidities Affecting Occupational Performance: Presence of comorbidities impacting occupational performance    Comorbidities impacting occupational performance description: see SUBJECTIVE    Modification or Assistance to Complete Evaluation  Min-Moderate modification of tasks or assist with assess necessary to complete eval    OT Frequency 2x / week    OT Duration 12 weeks   and PRN to complete BLE Intensive Phase CDT and fit with  appropriate compression   OT Treatment/Interventions Self-care/ADL training;Therapeutic exercise;Coping strategies training;Therapeutic activities;Manual lymph drainage;Manual Therapy;Other (comment);Compression bandaging;DME and/or AE instruction;Patient/family education    Plan Intensive and Self-Management Phase of Complete Decongestive Therapy (CDT) to include MLD, skin care, ther ex and compression. Lymphedema management has a high burden of care, especially for Patients who have difficulty, or who are unable to reach their distal legs and feet.  reach their feet and distal legs to aapply bandages, compression garments, to bathe , inspect skin and groom nails. In this case because Pt is unable to perform these activities, daily caregiver assistance with the lymphedema self-care home program  between OT visits is essential for achieving clinical success. Without ongoing, daily caregiver assistance during compression bandaging, donning and doffing compression garments, skin care, and MLD , Pt's prognosis is poor. However, with consistent caregiver assistance with lymphedema home program prognosis for improved lymphedema control and reducing infection risk and progression is good.    OT Home Exercise Plan Fit with appropriate compression garments for full-time , daily use. Considerations: Pt unable to reach feet to don/ doff standard, elastic garments    Consulted and Agree with Plan of Care Patient             Patient will benefit from skilled therapeutic intervention in order to improve the following deficits and impairments:   Body Structure / Function / Physical Skills: ADL, Edema, Skin integrity, Pain, IADL, Sensation       Visit Diagnosis: Lymphedema, not elsewhere classified    Problem List Patient Active Problem List   Diagnosis Date Noted   Wound infection 04/26/2021   Chronic diastolic CHF (congestive heart failure) ([place]) 04/26/2021   CKD (chronic kidney disease), stage IIIa  04/26/2021   Iron deficiency anemia 04/26/2021   Difficulty in walking, not elsewhere classified 01/14/2021   AKI (acute kidney injury) ([place]) 12/03/2020   Hyperkalemia 12/03/2020   Edema 07/02/2020   Hypertensive heart disease without congestive heart  failure 07/02/2020   Other personal history presenting hazards to health 07/02/2020   Morbid obesity ([place]) 07/02/2020   Peripheral vascular disease ([place]) 07/02/2020   Cataract 07/02/2020   Psychosexual dysfunction with inhibited sexual excitement 07/02/2020   Issue of medical certificate for disability examination 07/02/2020   Tobacco use disorder 76/28/3151   Umbilical hernia 76/16/0737   Pain due to onychomycosis of toenails of both feet 01/23/2020   Venous ulcer ([place]) 04/11/2019   Chronic venous insufficiency 07/07/2016   Venous stasis dermatitis of both lower extremities 07/07/2016   Lymphedema 07/07/2016   Type II diabetes mellitus with renal manifestations ([place]) 07/07/2016   Essential hypertension 07/07/2016   Hyperlipidemia 07/07/2016    Andrey Spearman, MS, OTR/L, CLT-LANA 10/20/21 4:15 PM   [place] MAIN [facility] SERVICES [place], [place], [address] Phone: [phone]   Fax:  [phone]  Name: [name] MRN: [id number] Date of Birth: 11-26-1950

## 2021-10-25 ENCOUNTER — Ambulatory Visit: Payer: PPO | Admitting: Occupational Therapy

## 2021-10-25 DIAGNOSIS — I890 Lymphedema, not elsewhere classified: Secondary | ICD-10-CM

## 2021-10-25 NOTE — Patient Instructions (Signed)
Lymphedema Self- Care Instructions   1. EXERCISE: Perform lymphatic pumping there ex at least 2 x a day while wearing your compression wraps or garments. Perform 10 reps of each exercise bilaterally and be sure to perform them in order. Don't skip around!  OMIT PARTIAL SIT UPs.  2. MLD: Perform simple self-manual lymphatic drainage (MLD) at least once a day as directed. Take your time! Breathe! ;-)  3. If you have a Flexitouch advanced "pump" use it 1 time each day on a single limb only. The Flexitouch moves lymphatic fluid out of your affected body part and back to your heart, so DO NOT use the Flexi on 2 legs at a time, and DO NOT ues it on 2 legs on the same day. If you experience any atypical shortness of breath, sudden onset of pain, or feelings of heart arhythmia, or racing, discontinue use of the Flexitouch and report these symptoms to your doctor right away. Also, discontinue Flexi if you have an infection or a fever. It's OK to resume using the device 72 hours AFTER your first dose of oral antibiotic.   4. 4. WRAPS: Compression wraps are to be worn 23 hrs/ 7 days/wk during Intensive Phase of Complete Decongestive Therapy (CDT).Building tolerance may take time and practice, so don't get discouraged. If bandages begin to feel tight during periods of inactivity and/or during the night, try performing your exercises to loosen them. Do not leave short stretch wraps in place for > 23 hours. It is very important that you remove all wraps daily to inspect skin, bathe and perform skin care before reapplying your wraps.  5. Daytime GARMENTS/ HOS DEVICES: During the Self-Management Phase CDT your compression garments are to be worn during waking hours when active. Do NOT sleep in your garments!!  Don daytime garments first thing in the morning. Do not wear your HOS devices all day instead of garments. These will not contain your swelling.  6. PUT YOUR FEET UP! Elevate your feet and legs and feet to the  level of your heart whenever you are sitting down.   7. SKIN: Carefully monitor skin condition and perform impeccable hygiene daily. Bathe skin with mild soap and water and apply low pH lotion (aka Eucerin ) to improve hydration and limit infection risk.  

## 2021-10-25 NOTE — Therapy (Signed)
[place] [facility] [address] [place], [place], [address] Phone: [phone]   Fax:  [phone]  Occupational Therapy Treatment  Patient Details  Name: [name] MRN: [id number] Date of Birth: [birth year] Referring Provider (OT): Jeri Cos, [place]   Encounter Date: 10/25/2021   OT End of Session - 10/25/21 1301     Visit Number 4    Number of Visits 36    Date for OT Re-Evaluation 01/12/22    OT Start Time 1004    Activity Tolerance Patient tolerated treatment well;No increased pain    Behavior During Therapy WFL for tasks assessed/performed             Past Medical History:  Diagnosis Date   Diabetes mellitus without complication ([place])    Hyperlipidemia    Hypertension    Leukemia ([place])     Past Surgical History:  Procedure Laterality Date   CATARACT EXTRACTION Right    TONSILLECTOMY     TOOTH EXTRACTION      There were no vitals filed for this visit.   Subjective Assessment - 10/25/21 1309     Subjective  [name] presents to Occupational Therapy for CDT to BLE. Pt is accompanied by his cousin , Baker Janus, who tells me she applied compression wrapping over the weekend without difficulty.  Pt denies LE related leg pain. Pt presents with drainage from dorsal  R foot at base of toes on outside of wraps. Baker Janus reports skin on base of toes  is wet.    Patient is accompanied by: Family member   cousin, Baker Janus, drove Pt to clinic and attended appt   Pertinent History Relevant to lymphedema: No presence of varicose veins or arterial sufficiency, and DVT and ruled out by Ronita Hipps, MD at [facility]. Pt declined venogram offered by Dr. Haynes Kerns. . Pt has a basic sequential pneumatic compression device, or "pump" , set at 40 mmHg, uses 2 x daily by report. Marland Kitchen PMH significant for leukemia, HTN, PVD, CAD and CHF. Pt has DM with neuropathy. CVI CKD, Sage 3a. Tobacco abuse disorder- quit 2010.    Limitations Impaired  sensation ; increased infection risk, increased falls risk, iimpaired dynamic balance, difficulty walking, non-healing leg wounds, chronic, progressive , BLE lymphedema, impaired hip, knee and ankle AROM; impaired basic and instrumental ADLs ( bathing, grooming, dressing, skin inspection, shopping, home management, sleeping, driving, functional ambulation and transfers), impaired work and productive activities, impaired leisure pursuits (no longer able to gather with friends to play music; sedentary lifestyle) impaired social participation    Special Tests + Stemmer sign bilaterally, Intake FOTO 56/100%    Currently in Pain? No/denies    Pain Onset --   leg pain onset s/p 5 years w onset of swelling                         OT Treatments/Exercises (OP) - 10/25/21 1525       ADLs   ADL Education Given Yes      Manual Therapy   Compression Bandaging multilayer, gradient compression wraps utilizing one ach 8 and 10 cm wide short stretch bandages over cotton stockinett and 0.4 cm thick Rosidal foam from base of toes to tibial tuberosity. Non-stick, Telfa  type pad placed over area of skin breakdown noted at last visit when wound ctr wraps were removed.  OT Education - 10/25/21 1527     Education Details Pt and CG edu re importance of properly fitting shoes to protect diabetic feet, from injury, to limit falls, and to act as part of compression system to limit foot swelling in forefoot and toes.    Person(s) Educated Patient;Other (comment)   cousin Baker Janus   Methods Explanation;Demonstration    Comprehension Verbalized understanding;Returned demonstration;Need further instruction                 OT Long Term Goals - 10/15/21 1228       OT LONG TERM GOAL #1   Title Given this patient's Intake score of 56/100% on the functional outcomes FOTO tool, patient will experience an increase in function of 3 points to 59, to improve basic and  instrumental ADLs performance, including lymphedema self-care.    Baseline Max A    Time 12    Period Weeks    Status New    Target Date 01/13/22      OT LONG TERM GOAL #2   Title Pt will demonstrate understanding of lymphedema precautions and prevention strategies with modified independence using a printed reference to identify at least 5 precautions and discussing how s/he may implement them into daily life to reduce risk of progression and to limit infection risk.    Baseline Max A    Time 4    Period Days    Status New    Target Date --   4th OT Rx visit     OT LONG TERM GOAL #3   Title With Maximum caregiver assistance Pt will be able to apply multilayer, knee length, compression wraps using gradient techniques from base of toes to groin to decrease limb volume, to limit infection risk, and to limit lymphedema progression. Pt requires Max CG assist to achieve this goal b/c he is unable to reach his feet and toes.    Baseline Dependent    Time 12    Period Weeks    Status New    Target Date 01/13/22      OT LONG TERM GOAL #4   Title Pt will achieve at least a 10% limb volume reduction in BLE from ankle to groin to return limb to more normal size and shape, to limit infection risk, to decrease pain, to improve AROM and function, and to limit lymphedema progression.    Baseline dependent    Time 12    Period Weeks    Status New    Target Date 01/13/22      OT LONG TERM GOAL #5   Title Max caregiver assistance is essential in this case to complete Intensive and Self-management Phases of Complete Decongestive Therapy, including the Lymphedema self-care home program b/c Pt is unable to reach his feet to apply compression wraps and garments, to perform skin  and nail care and inspection and to perform simple self-MLD. Pt agrees to arrange for consistent daily caregiver prior to commencing CDT.    Baseline dependent    Time 1    Period Days    Status New    Target Date --   initial  Rx appointment     Long Term Additional Goals   Additional Long Term Goals Yes      OT LONG TERM GOAL #6   Title With Max caregiver assistance Pt will achieve and sustain no less than 85% compliance with all LE self-care home program components throughout CDT, including modified simple self-MLD, daily skin  care and inspection, lymphatic pumping the ex and appropriate compression to limit lymphedema progression and to limit further functional decline.    Baseline dependent    Time 12    Period Weeks    Status New    Target Date 01/13/22                   Plan - 10/25/21 1504     Clinical Impression Statement Fluid drainage observed on cotton stockinett through non-stick pad applliefd at R dorsum of foot at base of toes when caregiver wrapped leg yesterday. Pad is stuck to skin so we did not remove, but underneath skin is reddened, macerated and with pin hole sized hemorages. Toes are also reddened and foot and leg swelling increased. Scaling reduced on legs with application of skin care (Eucerin cream)  between visits. No MLD to RLE today until referring provider rules out possible infection at upcoming wound care visit. tomorrow morning. We reused existing wraps , starting with clean stockinett, since Pt did not clean wraps to clinic today. Discussed plan going forward with patient and caregiver recommending he be fitted with appropriate shoes that provide safe footing, protect his toes and feet from injury, and apply compression on dorsal foot b/c existing "post op" shoes are much too small, present a substantial fall risk, and straps cut off both blood and lymphatic circulation in feet, L>R. Spoke with referring providerm Jeri Cos, PA-C, by phone and he agrees with plan to see Pt tomorrow to assess R foot condition, he will explore a referral for custom shoes at a local [employer] that will protect diabetic feet and provide some compression, and advise [place] where to go from here since we are  unable to provide wound care in our clinic and are unable to treat when skin is breaking down. [facility], [address] [place], [place] [address], O: [phone], F: [phone].  Plan: OT and PA-C will discuss plan gooing forward after his wound care visit tomorrow. This OT feels Complete Decongestive Therapy for limb volume reduction at this time is not priority while fitting with optimal footwear is crucial for safety and health of the limbs.    OT Occupational Profile and History Detailed Assessment- Review of Records and additional review of physical, cognitive, psychosocial history related to current functional performance    Occupational performance deficits (Please refer to evaluation for details): ADL's;IADL's;Work;Leisure;Social Participation    Body Structure / Function / Physical Skills ADL;Edema;Skin integrity;Pain;IADL;Sensation    Rehab Potential Fair    Clinical Decision Making Multiple treatment options, significant modification of task necessary    Comorbidities Affecting Occupational Performance: Presence of comorbidities impacting occupational performance    Comorbidities impacting occupational performance description: see SUBJECTIVE    Modification or Assistance to Complete Evaluation  Min-Moderate modification of tasks or assist with assess necessary to complete eval    OT Frequency 2x / week    OT Duration 12 weeks   and PRN to complete BLE Intensive Phase CDT and fit with appropriate compression   OT Treatment/Interventions Self-care/ADL training;Therapeutic exercise;Coping strategies training;Therapeutic activities;Manual lymph drainage;Manual Therapy;Other (comment);Compression bandaging;DME and/or AE instruction;Patient/family education    Plan Intensive and Self-Management Phase of Complete Decongestive Therapy (CDT) to include MLD, skin care, ther ex and compression. Lymphedema management has a high burden of care, especially for Patients who have  difficulty, or who are unable to reach their distal legs and feet.  reach their feet and distal legs to  aapply bandages, compression garments, to bathe , inspect skin and groom nails. In this case because Pt is unable to perform these activities, daily caregiver assistance with the lymphedema self-care home program  between OT visits is essential for achieving clinical success. Without ongoing, daily caregiver assistance during compression bandaging, donning and doffing compression garments, skin care, and MLD , Pt's prognosis is poor. However, with consistent caregiver assistance with lymphedema home program prognosis for improved lymphedema control and reducing infection risk and progression is good.    OT Home Exercise Plan Fit with appropriate compression garments for full-time , daily use. Considerations: Pt unable to reach feet to don/ doff standard, elastic garments    Consulted and Agree with Plan of Care Patient             Patient will benefit from skilled therapeutic intervention in order to improve the following deficits and impairments:   Body Structure / Function / Physical Skills: ADL, Edema, Skin integrity, Pain, IADL, Sensation       Visit Diagnosis: Lymphedema, not elsewhere classified    Problem List Patient Active Problem List   Diagnosis Date Noted   Wound infection 04/26/2021   Chronic diastolic CHF (congestive heart failure) ([place]) 04/26/2021   CKD (chronic kidney disease), stage IIIa 04/26/2021   Iron deficiency anemia 04/26/2021   Difficulty in walking, not elsewhere classified 01/14/2021   AKI (acute kidney injury) ([place]) 12/03/2020   Hyperkalemia 12/03/2020   Edema 07/02/2020   Hypertensive heart disease without congestive heart failure 07/02/2020   Other personal history presenting hazards to health 07/02/2020   Morbid obesity ([place]) 07/02/2020   Peripheral vascular disease ([place]) 07/02/2020   Cataract 07/02/2020   Psychosexual dysfunction with inhibited  sexual excitement 07/02/2020   Issue of medical certificate for disability examination 07/02/2020   Tobacco use disorder 48/05/6551   Umbilical hernia 74/82/7078   Pain due to onychomycosis of toenails of both feet 01/23/2020   Venous ulcer ([place]) 04/11/2019   Chronic venous insufficiency 07/07/2016   Venous stasis dermatitis of both lower extremities 07/07/2016   Lymphedema 07/07/2016   Type II diabetes mellitus with renal manifestations ([place]) 07/07/2016   Essential hypertension 07/07/2016   Hyperlipidemia 07/07/2016   Andrey Spearman, MS, OTR/L, CLT-LANA 10/25/21 3:30 PM   [facility] [address] [place], [place], [address] Phone: [phone]   Fax:  [phone]  Name: [name] MRN: [id number] Date of Birth: 1950/11/02

## 2021-10-26 ENCOUNTER — Encounter: Payer: PPO | Admitting: Physician Assistant

## 2021-10-26 DIAGNOSIS — E11622 Type 2 diabetes mellitus with other skin ulcer: Secondary | ICD-10-CM | POA: Diagnosis not present

## 2021-10-26 NOTE — Progress Notes (Addendum)
[name], Mooney ([id number]) Visit Report for 10/26/2021 Chief Complaint Document Details Patient Name: [name], Mooney Date of Service: 10/26/2021 9:45 AM Medical Record Number: [id number] Patient Account Number: [id number] Date of Birth/Sex: 05/13/1950 (71 y.o. M) Treating RN: Cornell Barman Primary Care Provider: Fontaine No Other Clinician: Massie Kluver Referring Provider: Fontaine No Treating Provider/Extender: Skipper Cliche in Treatment: 39 Information Obtained from: Patient Chief Complaint Left LE ulcers Electronic Signature(s) Signed: 10/26/2021 10:13:40 AM By: Worthy Keeler PA-C Entered By: Worthy Keeler on 10/26/2021 10:13:40 [name] ([id number]) -------------------------------------------------------------------------------- HPI Details Patient Name: [name] Date of Service: 10/26/2021 9:45 AM Medical Record Number: [id number] Patient Account Number: [id number] Date of Birth/Sex: [birth year] (71 y.o. M) Treating RN: Cornell Barman Primary Care Provider: Fontaine No Other Clinician: Massie Kluver Referring Provider: Fontaine No Treating Provider/Extender: Skipper Cliche in Treatment: 47 History of Present Illness HPI Description: 71 year old male who presented to the ER with bilateral lower extremity blisters which had started last week. he has a past medical history of leukemia, diabetes mellitus, hypertension, edema of both lower extremities, his recurrent skin infections, peripheral vascular disease, coronary artery disease, congestive heart failure and peripheral neuropathy. in the ER he was given Rocephin and put on Silvadene cream. he was put on oral doxycycline and was asked to follow-up with the [facility]. His last hemoglobin A1c was 6.6 in December and he checks his blood sugar once a week. He does not have any physicians outside the [place] system. He does not recall any vascular duplex studies done either for arterial or  venous disease but was told to wear compression stockings which he does not use 05/30/2016 -- we have not yet received any of his notes from the [facility] hospital system and his arterial and venous duplex studies are scheduled here in [place] around mid February. We are unable to have his insurance accepted by home health agencies and hence he is getting dressings only once a week. 06/06/16 -- -- I received a call from the patient's PCP at the [facility] at [facility] and spoke to Dr. Garvin Fila, phone number [phone] and fax number [phone]. She confirmed that no vascular testing was done over the last 5 years and she would be happy to do them if the patient did want them to be done at the [place] and we could fax him a request. Readmission: 71 year old male seen by as in February of this year and was referred to vein and vascular for studies and opinion from the vascular surgeons. The patient returns today with a fresh problem having had blisters on his left lower extremity which have been there for about 5 days and he clearly states that he has been wearing his compression stockings as advised though he could not read the moderate compression and has been wearing light compression. Review of his electronic medical records note that he had lower extremity arterial duplex examination done on 06/23/2016 which showed no hemodynamically significant stenosis in the bilateral lower extremity arterial system. He also had a lower extremity venous reflux examination done on 07/07/2016 and it was noted that he had venous incompetence in the right great saphenous vein and bilateral common femoral veins. Patient was seen by Dr. Tamala Julian on the same day and for some reason his notes do not reflect the venous studies or the arterial studies and he recommended patient do a venous duplex ultrasound to look for reflux and return to see him.he would also consider a lymph pump if required. The patient was  told that his  workup was normal and hence the patient canceled his follow-up appointment. 02/03/17 on evaluation today patient left medial lower extremity blister appears to be doing about the same. It is still continuing to drain and there's still the blistered skin covering the wound bed which is making it difficult for the alternate to do its job. Fortunately there is no evidence of cellulitis. No fevers chills noted. Patient states in general he is not having any significant discomfort. Patient's lower extremity arterial duplex exam revealed that patient was hemodynamically stable with no evidence of stenosis in regard to the bilateral lower extremities. The lower extremity venous reflux exam revealed the patient had venous incontinence noted in the right greater saphenous and bilateral common femoral vein. There is no evidence of deep or superficial vein thrombosis in the bilateral lower extremities. Readmission: 11/12/18 Patient presents for evaluation our clinic today concerning issues that he is having with his left lower extremity. He tells me that a couple weeks ago he began developing blisters on the left lower extremity along with increased swelling. He typically wears his compression stockings on a regular basis is previously been evaluated both here as well is with vascular surgery they would recommend lymphedema pumps but unfortunately that somehow fell through and he never heard anything back from that. Nonetheless I think lymphedema pumps would be beneficial for this patient. He does have a history of hypertension and diabetes. Obviously the chronic venous stasis and lymphedema as well. At this point the blisters have been given in more trouble he states sometimes when the blisters openings able to clean it down with alcohol and it will dry out and do well. Unfortunately that has not been the case this time. He is having some discomfort although this mean these with cleaning the areas he doesn't have  discomfort just on a regular basis. He has not been able to wear his compression stockings since the blisters arose due to the fact that of course it will drain into the socks causing additional issues and he didn't have any way to wrap this otherwise. He has increased to taking his Lasix every day instead of every other day. He sees his primary care provider later this month as well. No fevers, chills, nausea, or vomiting noted at this time. 11/19/18-Patient returns at 1 week, per intake RN the amount of seepage into the compression wraps was definitely improved, overall all the wounds are measuring smaller but continuing silver alginate to the wounds as primary dressing 11/26/18 on evaluation today patient appears to be doing quite well in regard to his left lower [place] ulcers. In fact of the areas that were noted initially he only has two regions still open. There is no evidence of active infection at this time. He still is not heard anything from the company regarding lymphedema pumps as of yet. Again as previously seen vascular they have not recommended any surgical intervention. 12/03/2018 on evaluation today patient actually appears to be doing quite well with regard to his lower extremity ulcers. In fact most of the areas appear to be healed the one spot which does not seem to be completely healed I am unsure of whether or not this is really draining that much but nonetheless there does not appear to be any signs of infection or significant drainage at this point. There is no sign of fever, chills, nausea, vomiting, or diarrhea. Overall I am pleased with how things have progressed I think is very close to  being able to transition to his home compression stockings. [name], [name] ([id number]) 12/10/2018 upon evaluation today patient appears to be doing quite well with regard to his left lower extremity. He has been tolerating the dressing changes without complication. Fortunately there is no signs  of active infection at this time. He appears after thorough evaluation of his leg to only have 1 small area that remains open at this point everything else appears to be almost completely closed. He still have significant swelling of the left lower extremity. We had discussed discussing this with his primary care provider he is not able to see her in person they were at the [facility] and right now the [place] is not seeing patients on site. According to the patient anyway. Subsequently he did speak with her apparently and his primary care provider feels that he may likely have a DVT. With that being said she has not seen his leg she is just going off of his history. Nonetheless that is a concern that the patient now has as well and while I do not feel the DVT is likely we can definitely ensure that that is not the case I will go ahead and see about putting that order in today. Nonetheless otherwise I am in a recommend that we continue with the current wound care measures including the compression therapy most likely. We just need to ensure that his leg is indeed free of any DVTs. 12/17/2018 on evaluation today patient actually appears to be completely healed today. He does have 2 very small areas of blistering although this is not anything too significant at this point which is good news. With that being said I am in agreement with the fact that I think he is completely healed at this point. He does want to get back into his compression stocking. The good news is we have gotten approval from insurance for his lymphedema pumps we received a letter since last saw him last week. The other good news is his study did come back and showed no evidence of a DVT. 12/20/2018 on evaluation today patient presents for follow-up concerning his ongoing issues with his left lower extremity. He was actually discharged last Friday and did fairly well until he states blisters opened this morning. He tells me he has been  wearing his compression stocking although he has a hard time getting this on. There does not appear to be any signs of active infection at this time. No fevers, chills, nausea, vomiting, or diarrhea. 12/27/2018 on evaluation today patient appears to be doing very well with regard to his swelling of the left lower extremity the 4 layer compression wrap seems to have been beneficial for him. Fortunately there is no signs of active infection at this time. Patient has been tolerating the compression wrap without complication and his foot swelling in particular appears to be greatly improved. He does still have a wound on the lateral portion of his left leg I believe this is more of a blister that has now reopened. 01/03/2019 on evaluation today patient actually appears to be doing excellent in regard to his left lower extremity. He did receive his compression pumps and is actually use this 7 times since he was last here in the office. On top of the compression wrap he is now roughly 3 cm better at the calf and 2 cm better at the ankle he also states that his foot seem to go an issue better without even having to use a  shoe horn. Obviously I think this is all evidence that he is doing excellent in this regard. The other good news is he does not appear to have anything open today as far as wounds are concerned. 01/15/2019 on evaluation today patient appears to be doing more poorly yet again with regard to his left lower extremity. He has developed new wounds again after being discharged just recently. Unfortunately this continues to be the case that he will heal and then have subsequent new wounds. The last time I was hopeful that he may not end up coming back too quickly especially since he states he has been using his lymphedema pumps along with wearing his compression. Nonetheless he had a blister on the back of his leg that popped up on the left and this has opened up into an ulceration it is quite  painful. 01/22/19 on evaluation today patient actually appears to be doing well with regard to his wound on the left lower extremity. He's been tolerating the dressing changes without complication including the compression wrap in the wound appears to be significantly smaller today which is great news. Overall very pleased in this regard. 01/29/2019 on evaluation today patient appears to be doing well with regard to his left posterior lower extremity ulcer. He has been tolerating the dressing changes without complication. This is not completely healed but is getting much closer. We did order a Farrow wrap 4000 for him he has received this and has it with him today although I am not sure we are quite ready to start him on that as of yet. We are very close. 02/05/2019 on evaluation today patient actually appears to be doing quite well with regard to his left posterior lower extremity ulcer. He still has a very tiny opening remaining but the fortunate thing is he seems to be healing quite nicely. He also did get his Farrow wrap which I am hoping will help with his edema control as well at home. Fortunately there is no evidence of active infection. 02/12/2019 patient and fortunately appears to be doing poorly in regard to his wounds of the left lower extremity. He was very close to healing therefore we attempted to use his Velcro compression wraps continuing with lymphedema pumps at home. Unfortunately that does not seem to have done very well for him. He tells me that he wore them all the time but again I am not sure why if that is the case that he is having such significant edema. He is still on his fluid pills as well. With that being said there is no obvious sign of infection although I do wonder about the possibility of infection at this time as well. 02/19/2019 unfortunately upon evaluation today patient appears to be doing more poorly with regard to his left lower extremity. He is not showing signs of  significant improvement and I think the biggest issue here is that he does have an infection that appears to likely be Pseudomonas. That is based on the blue-green drainage that were noted at this time. Unfortunately the antibiotic that has been on is not going to take care of this at all. I think they will get a need to switch him to either Levaquin or Cipro and this was discussed with the patient. 02/26/2019 on evaluation today patient's lower extremity on the left appears to be doing significantly better as compared to last evaluation. Fortunately there is no signs of active infection at this time. He has been tolerating the compression  wrap without complication in fact he made it the whole week at this point. He is showing signs of excellent improvement I am very happy in this regard. With that being said he is having some issues with infection we did review the results of his culture which I noted today. He did have a positive finding for Enterobacter as well as Alcaligenes faecalis. Fortunately the Levaquin that I placed him on will work for both which is great news. There is no signs of systemic infection at this point. 10/30; left posterior leg wound in the setting of very significant edema and what looks like chronic venous inflammation. He has compression pumps but does not use them. We have been using 3 layer compression. Silver alginate to the wound as the primary dressing 03/18/2019 on evaluation today patient appears to be doing a little better compared to last time I saw him. He really has not been using his compression pumps he tells me that he is having too much discomfort. He has been keeping his wraps on however. He is only been taking his fluid pills every other day because he states they are not really helping and he has an appointment with his primary care provider at the [facility] tomorrow. Subsequently the wound itself on the left lower extremity does seem to be greatly improved  compared to previous. 03/25/2019 on evaluation today patient appears to be doing better with regard to his wounds on the bilateral lower extremities. The left is doing excellent the right is also doing better although both still do show some signs of open wounds noted at this point unfortunately. Fortunately there is no signs of active infection at this time. The patient also is not really having any significant pain which is good news. Unfortunately there was some confusion with the referral on vascular disease and as far as getting the patient scheduled there can be contacting him later today to do this [name], [name] ([id number]) fortunately we got this straightened out. 04/01/2019 on evaluation today patient appears to be doing no fevers, chills, nausea, vomiting, or diarrhea. Excellent at this time with regard to his lower extremities. There does not appear to be any open wound at this point which is good news. Fortunately is also no signs of active infection at this time. Overall feel like the patient has done excellent with the compression the problem is every time we got him to this point and then subsequently go to using his own compression things just go right back to where they were. I am not sure how to address this we can try to get an appointment with vascular for 2 weeks now they have yet to call him. Obviously this has become frustrating for the patient as well. I think the issue has just been an honest error as far as scheduling is concerned but nonetheless still worn out the point where I am unsure of which direction we should take. 04/08/2019 on evaluation today patient actually appears to be doing well with regard to his lower extremities. There are no open wounds at this time and things seem to be managing quite nicely as far as the overall edema control is concerned. With that being said he does have his compression socks today for [place] to go ahead and reinitiate therapy in that  manner at this point. He is going to be going for shoes to be measured on Wednesday and then coincidentally he will also be seeing vascular on Thursday. Overall I think this  is good news and again I am hopeful that they will be able to do something for him to help prevent ongoing issues with edema control as well. No fevers, chills, nausea, vomiting, or diarrhea. 04/11/2019 on evaluation today patient actually appears to be doing poorly after just being discharged on Monday of this week. He had been experiencing issues with again blisters especially on the left lower extremity. With that being said he was completely healed and appeared to be doing great this past Monday. He then subsequently has new blisters that formed before his appointment with vascular this morning. He was also measured for shoes in the interim. With that being said we may have figured out what exactly is going on and why he continues to have issues like what we are seeing at this point. He takes his compression stockings off at nighttime and then he ends up having to sleep in his chair for 5-6 hours a night. He sleeps with his feet down he cannot really get him up in the recliner and therefore he is sleeping and the worst possible his position with his feet on the floor for that majority of the time. Again as I explained to him that is about one third at minimum at least one fourth of his day that he spending with his feet dangling down on the ground and the worst possible position they could be. I think this may be what is causing the issue. Subsequently I am leaning toward thinking that he may need a hospital bed in order to elevate his legs. We likely can have to coordinate this with his primary care provider at the [facility]. Readmission: 01/26/2021 this is a patient who presents for repeat evaluation here in the clinic although it is actually been couple of years since have seen him in fact it was December 2020 when I last  saw him. Subsequently he never really healed but did end up being lost to follow-up. He tells me has been having issues ongoing with his lower extremities has bilateral lower extremity lymphedema no real significant or definitive open wounds but in general his lymphedema is way out of control. We were never able to refer him to lymphedema clinic simply due to the fact to be honest we were never able to get him completely healed. I do not see anyone with open wounds. The patient does have evidence of type 2 diabetes mellitus, lymphedema, chronic venous insufficiency, and hypertension. That really has not changed since his last evaluation. 02/09/2021 upon evaluation today patient appears to be doing a little better in regard to his legs although he still having a tremendous amount of drainage especially on the left leg. Fortunately there does not appear to be any evidence of active infection. Of note when we looked into this further it appears that the patient did not have any absorptive dressing on it was just the 4-layer compression wrap. Nonetheless this is probably big part of the issue here. 10/10; he comes in today with 3 large areas on the upper right lower leg likely remanence of denuded blistering under his compression wraps. He has no other wounds on the right. On the left he has the denuded area on the left medial foot and ankle and on the left dorsal foot. Massive lymphedema in both feet dorsally. Using Zetuvit under compression We have increased home health visitation to twice a week to change the dressings and will change it once 02/22/2021 upon evaluation today patient appears to  be doing well currently with regard to his wounds. He has been tolerating the dressing changes without complication. Fortunately there does not appear to be any evidence of active infection which is great news. No fevers, chills, nausea, vomiting, or diarrhea. The biggest issue I see currently is that home health  is not putting any medicine on the actual wounds before wrapping. 03/01/2021 upon evaluation today the patient's right leg actually appears to be doing quite well which is great news there does not appear to be any evidence of active infection at this time. No fevers, chills, nausea, vomiting, or diarrhea. With that being said the patient is having issues on the left foot where he is having significant drainage is also an ammonia smell he does not have any animals at home and this makes me concerned about a bacteria producing urea as a byproduct. Again the possible common organisms will be E. coli, Proteus, and Enterococcus. All 3 of which can be successfully treated with Levaquin. For that reason I think that this may be a good option for [place] to consider placing him on and I did obtain a culture as well for confirmation sake. 03/08/2021 upon evaluation today patient appears to be doing unfortunately still somewhat poorly in regard to his leg ulcerations. He actually has an area on the right leg where he blistered due to the fact that his wrap slid down and caused an area of pinching on his skin and this has led to a significant issue here. 03/15/2021 upon evaluation today patient unfortunately has not been wrapped appropriately with absorptive dressings nor with the appropriate technique for the third layer of the 4-layer compression wrap. These are issues that we continue to try to address with the home health nurse. Also the absorptive dressing that she had was cut in half and therefore that causes things to leak out it does not actually trap the fluid in regard to the top of the foot overall I think that all these combined are really not seeing things improved significantly here. Fortunately there does not appear to be any signs of significant infection at this time which is good news. He still is having a tremendous amount of drainage. 03/22/2021 upon evaluation today patient appears to be  draining tremendously. He still continues to tell me that he is using his pumps 2 times a day and that coupled with that tells me that he is elevating his legs as well. With that being said all things considered I am really just not seeing the improvement we would expect to see with the 4-layer compression wrap and all the above noted. He in fact had an extremely large Zetuvit dressing on both legs and that they were extremely filled to the max with fluid. This is after just being changed just before the weekend and this is Monday. Nonetheless I am concerned about the fact that there is something going on fairly significant that we cannot get any of this under control and that he is draining this significantly. He supposed be having an echocardiogram it sounds like scheduling has been an issue for him as far as getting in sooner. Its something to do with needing his cousin to drive him because of where it sat and he cannot drive himself to this appointment either way I really think he needs to try to see what he can do about making this happen a little sooner. He tells me he will call today. [name], [name] ([id number]) 03/29/2021 on evaluation today  patient appears to be doing about the same in regard to his legs. He did get his cardiology appointment moved up to 6 December which is at least good that is better than what it was before mid December. Overall very pleased in that regard. 04/05/2021 upon evaluation today patient unfortunately is still doing fairly poorly. There does not appear to be any signs of active infection at this time. No fevers, chills, nausea, vomiting, or diarrhea. Unfortunately I think until his edema is under control and overall fluid overload there is really not to be much chance that I can do much to get him better. This is quite unfortunate and frustrating both for myself and the patient to be perfectly honest. Nonetheless I think that he really needs to have a conversation  both with his primary care provider as well as cardiologist he sees the PCP on Monday and cardiology on Wednesday of next week. 04/13/2021 upon evaluation today patient appears to be doing poorly in regard to his bilateral lower extremities his left is still worse than the right. With that being said he has a tremendous amount of drainage he did see his primary care provider yesterday there really was not much there to be done from their perspective. He sees cardiology tomorrow. Nonetheless my biggest concern here is simply that if we do not get the edema under control he is going to continue to have drainage and honestly I think at some point he is going to become infected severely that is my main concern. 04/19/2021 upon evaluation today patient appears to be doing poorly still in regard to his legs. Unfortunately there does not appear to be any signs of infection at this point. He does have a tremendous amount of drainage however. We have not seen the results back from the cardiologist and the echocardiogram that was done. It appears that the patient checked out okay as far as that is concerned with regard to ejection fraction though we still have some issues here to be honest with his diastolic function. I am unsure if this is accounting for everything that we are seeing or not. Either way he has a tremendous amount of drainage from his legs that we are just not able to control in the outpatient setting at this point. I have reached out to Dr. Rockey Situ his cardiologist to see once he reviews the sheet if there is anything that he feels like can be done from an outpatient perspective if not then I think the way to go is probably can to be through inpatient admission and diuresis. Otherwise I am not sure how working to get this under control we tried antibiotics, compression wrapping, and I have told the patient to be elevating his legs I am not sure how much he does of this but either way I think that  this is still an ongoing issue nonetheless. 04/26/2021 upon evaluation today patient appears to be doing poorly in regard to his legs. He is having a tremendous amount of fluid at this point which is quite unfortunate. Its to the point that he may have had at least 5 to 10 pounds of fluid in his dressings this morning when they were removed these were changed this Friday. Subsequently I think he needs to go to the ER for further evaluation and treatment I think is probably can need diuresis possibly even IV antibiotics been on what the blood work looks like but in general I feel like he needs something to get  this under control from an outpatient perspective absent of everywhere I can think of and I cannot get this under control with our traditional measures. I think this is going require more so that we can get him better 12/30; this is a patient with severe bilateral lymphedema. He was hospitalized from 04/26/2021 through 04/29/2021 treated for cellulitis in the setting of lower extremity ulcers and lymphedema. After he left the hospital he is apparently seen for nurse visit our staff contacted cardiology and he has been started on Lasix 40 mg. Apparently his legs have less edema. Lab work from 05/04/2021 showed a BUN of 38 and creatinine of 1.59 these are elevated versus previous where his creatinine seems to have been 1.30 on 12/19 his potassium is 4.3. I believe the lab work is being followed by cardiology We have him in a 4-layer wrap. Xeroform on the leg wounds and sit to fit on the Berry damage skin on the left dorsal foot versus right dorsal foot. He has compression pumps but does not use them. We have apparently not yet ordered him compression stockings 05/17/2021 upon evaluation today patient's legs though better than last time I personally saw him appear to be getting worse compared to where they were previous. Dr. Quentin Cornwall was actually last 1 to see you I have not seen him since 19  December. That was before he went into the ER. Coming out apparently his legs looked also and they still look better but not as good as they were in the past. 1/16; patient with severe bilateral lymphedema. Severe scaled hyperkeratotic skin on the dorsal aspect of his distal left foot and left medial ankle.. On the right side changes are not as bad. He did not have any weeping edema. Our intake nurse was convinced that he is being compliant with compression pumps 1 hour twice a day 05/31/2021 upon evaluation today patient actually appears to be doing a little bit better in my opinion in regard to his feet. I do not see as much drainage and it being just completely wet as it was previous. Fortunately I do not also see any signs of active infection which is great news as well. 06/07/21 Upon inspection patient's wound bed actually showed signs of doing well he is not nearly as weepy and wet as he has been in the past and overall very pleased in that regard. Fortunately I do not see any signs of active infection locally or nor systemically at this time. Which is great news. No fevers, chills, nausea, vomiting, or diarrhea. 06/14/2021 upon evaluation today patient appears to be doing well with regard to his right foot I am pleased in that regard. His left foot is still draining quite a bit despite using lymphedema pumps, 4-layer compression wraps, and he tells me elevating his legs as well. He also has Lasix that he takes twice a day. Nonetheless I believe that this is still good to be an ongoing issue. We have a hard time getting this under control as far as the swelling is concerned. 06/21/2021 upon evaluation today patient appears to be doing decently well in regard to his wounds all things considered. He still has a tremendous amount of drainage and fluid noted at this point. Fortunately I do not see any signs of active infection locally or systemically at this point which is great news. Nonetheless I am  unsure where to go and how to do this as far as trying to limit his swelling and weeping from  his toes in particular. 06/28/2021 upon evaluation patient unfortunately continues to have significant drainage from his feet. We have been keeping him in a compression wrap and despite this he still continues to have extreme fluid issues he seen his cardiologist he is seeing the nephrologist. We really cannot find any way to get this under control when he did well was when he was in the hospital and they got some of the fluid away. But outside of that we are just struggling to achieve the long-term goal of getting this under control and keeping it under control unfortunately. 07/05/2021 upon evaluation today patient appears to be doing poorly in regard to his feet. Unfortunately this continues to be a significant issue and to be honest I am really not certain what to do about it. I referred him to Dr. Randol Kern at [facility] and he does have an appointment although it is 12 April. He also sees his primary care provider on 6 April. He did not want to see Dr. Haynes Kerns until after he saw his PCP that is the reason the appointment so far out. There is really not much I can do in that regard. Nonetheless I do think that we are still continue to have significant lymphedema issues with significant mount of weeping in regard to the feet and again this has just become extremely difficult to manage to be honest I am not sure if there is something else that Dr. Haynes Kerns or someone else could recommend he also will be seeing Dr. Dellia Nims in 2 weeks when I am on vacation and at that time I will see if Dr. Dellia Nims has any ideas about where to go from here in the meantime. LULA, MICHAUX ([id number]) 07/12/2021 upon evaluation today patient appears to be doing about as well as can be expected with regard to his feet. He does actually see his kidney doctor this Friday. He also will be seeing his primary care provider on April 4 and then  following that around mid April he will be seeing Dr. Haynes Kerns at [facility] which was a referral made for him. Again my goal is to try to find out some way to fix this and to be perfectly honest we have had some issues with making any good adjustments. When he was in the hospital and greater amounts of Lasix he was able to get this down and it looked much better upon discharge. With that being said right now things just are not doing nearly as good as what they used to be. 3/13; patient presents for follow-up. He has been using his lymphedema pumps over the past week. He reports an increase in his Lasix dose. He has no issues or complaints today. He denies signs of infection. 07/26/2021 upon evaluation today patient appears to be doing better in regard to his feet bilaterally. Both are showing signs of much less drainage which is great news and overall very pleased in that regard. Fortunately there does not appear to be any evidence of active infection locally or systemically at this time. No fevers, chills, nausea, vomiting, or diarrhea. 08/02/2021 upon evaluation today patient appears to be doing well with regard to his feet. Both are showing signs of being drier the right pretty much has not really draining much at all which is great news. The left is not draining anywhere close to his much as it was during the last evaluation. This is excellent news and overall very pleased. 08/09/2021 upon evaluation today patient appears to be doing  well with regard to his legs the right leg especially showing signs of excellent improvement which is great news I do not see any evidence of active infection locally or systemically which is great. In regard to the left leg he still has some weeping and drainage but nothing as significant as what it was in the past this is great news. 08-16-2021 upon evaluation today patient appears to actually be doing quite well in my opinion in regard to his feet. This is significantly  improved compared to what we previously seen and overall I am extremely pleased in that regard. I do believe that He is actually improving although this is obviously very slow going. 08-23-2021 upon evaluation today patient appears to be doing well currently in regard to his right leg which actually is pretty dry at this point today. Fortunately I do not see any evidence of active infection at this time which is great news. No fevers, chills, nausea, vomiting, or diarrhea. 08-30-2021 upon evaluation today patient appears to be doing well with regard to his lower extremities. The right foot is pretty much completely dry which is great news the left foot though not completely dry seems to be doing decently well. I do not see any signs of active infection locally or systemically which is great news. No fevers, chills, nausea, vomiting, or diarrhea. 09-06-2021 upon evaluation today patient appears to be doing well with regard to his feet in fact now the left foot is almost completely dry as well and I am definitely seeing a lot of significant improvement. With that being said unfortunately he actually appears to have some cellulitis of his right thigh. His toes are also little bit red but this may just be due to the increased swelling. He really is not warm to touch in regard to the toes. 5/8; excellent edema control on the right foot and lower leg there is no open wounds but we continue to put compression on this otherwise this will breakdown. He is using his compression pumps twice a day at home The area that is problematic is on the left dorsal foot some areas that are not fully epithelialized with very dry fissured skin over this area. 09-20-2021 upon evaluation today patient appears to be doing a little bit worse in regard to swelling at this time. Fortunately I do not see any signs of infection with that being said the wrap was not on quite as well as what I would like to have seen. I do believe that  this has caused a little bit of excess swelling and again we need to try to get this under good control. 09-27-2021 upon evaluation today patient appears to be doing awesome in regard to his feet and legs. Everything is measuring smaller the swelling is down and to be perfectly honest I am extremely pleased with the end of his feet especially on the left side and how dry this is today. I do think we are on the right track here. 5/31; patient presents for follow-up. He is using nystatin powder to the feet bilaterally under 4-layer compression. He has no issues or complaints today. He states he is going to the lymphedema clinic tomorrow. Since he has no open wounds on the right lower extremity they will be focused on the side. 10-11-2021 upon evaluation today patient appears to be doing excellent in regard to his legs. He has been tolerating the dressing changes without complication. Fortunately there does not appear to be any evidence of  active infection locally or systemically at this time which is great news. No fevers, chills, nausea, vomiting, or diarrhea. 10-18-2021 upon evaluation today patient appears to be doing well with regard to his legs. He has been tolerating the dressing changes without complication and actually seen in lymphedema clinic now in regard to his right leg were taken care of the left leg. Fortunately I do not see any evidence of active infection locally or systemically at this time. 10-26-2021 upon evaluation today patient's wounds actually are showing signs of doing well in fact he really does not have wounds as much is weeping of the lower extremities. Fortunately I do not see any evidence of active infection locally or systemically which is great news. No fever or chills noted Electronic Signature(s) Signed: 10/26/2021 11:01:38 AM By: Worthy Keeler PA-C Entered By: Worthy Keeler on 10/26/2021 11:01:38 [name]  ([id number]) -------------------------------------------------------------------------------- Physical Exam Details Patient Name: [name] Date of Service: 10/26/2021 9:45 AM Medical Record Number: [id number] Patient Account Number: [id number] Date of Birth/Sex: [birth year] (71 y.o. M) Treating RN: Cornell Barman Primary Care Provider: Fontaine No Other Clinician: Massie Kluver Referring Provider: Fontaine No Treating Provider/Extender: Skipper Cliche in Treatment: 59 Constitutional Obese and well-hydrated in no acute distress. Respiratory normal breathing without difficulty. Psychiatric this patient is able to make decisions and demonstrates good insight into disease process. Alert and Oriented x 3. pleasant and cooperative. Notes Upon inspection patient's wound bed actually showed signs of good appearance to the legs in general he is actually significantly smaller compared to what has been previous. Fortunately I do not see any evidence of active infection locally or systemically which is great news. Electronic Signature(s) Signed: 10/26/2021 11:02:00 AM By: Worthy Keeler PA-C Entered By: Worthy Keeler on 10/26/2021 11:01:59 [name] ([id number]) -------------------------------------------------------------------------------- Physician Orders Details Patient Name: [name] Date of Service: 10/26/2021 9:45 AM Medical Record Number: [id number] Patient Account Number: [id number] Date of Birth/Sex: 1951/02/05 (71 y.o. M) Treating RN: Cornell Barman Primary Care Provider: Fontaine No Other Clinician: Massie Kluver Referring Provider: Fontaine No Treating Provider/Extender: Skipper Cliche in Treatment: 55 Verbal / Phone Orders: No Diagnosis Coding ICD-10 Coding Code Description E11.622 Type 2 diabetes mellitus with other skin ulcer I89.0 Lymphedema, not elsewhere classified I87.2 Venous insufficiency (chronic) (peripheral) L97.822  Non-pressure chronic ulcer of other part of left lower leg with fat layer exposed L97.512 Non-pressure chronic ulcer of other part of right foot with fat layer exposed I10 Essential (primary) hypertension L97.811 Non-pressure chronic ulcer of other part of right lower leg limited to breakdown of skin Follow-up Appointments o Return Appointment in 1 week. o Nurse Visit as needed o Other: Bathing/ Shower/ Hygiene o May shower with wound dressing protected with water repellent cover or cast protector. o No tub bath. Edema Control - Lymphedema / Segmental Compressive Device / Other Bilateral Lower Extremities o Optional: One layer of unna paste to top of compression wrap (to act as an anchor). - Unna paste on calf to secure wrap in place as needed o 4 Layer Compression System Lymphedema. - bilat lower leg -q week bi lat, nystatin powder between toes , 4x4 gauze between toes , ABD to cover toes; zetuvi over top of foot, charcoal pad , tubi grip C to foot and ankle double layer COTTON LAYER SPIRAL, LIGHT TAN SPIRAL. WHITE WITH YELLOW LINE FIGURE 8 , COBAN SPIRAL o Tubigrip double layer applied - size C bilat foot ankle foods o Elevate, Exercise Daily and Avoid  Standing for Long Periods of Time. o Elevate legs to the level of the heart and pump ankles as often as possible o Elevate leg(s) parallel to the floor when sitting. o Compression Pump: Use compression pump on left lower extremity for 60 minutes, twice daily. - 2 times per day o DO YOUR BEST to sleep in the bed at night. DO NOT sleep in your recliner. Long hours of sitting in a recliner leads to swelling of the legs and/or potential wounds on your backside. o Other: - Contact prescriber regarding use of diuretics to reduce fluid overload. Off-Loading o Turn and reposition every 2 hours Additional Orders / Instructions o Follow Nutritious Diet and Increase Protein Intake Electronic Signature(s) Signed:  10/27/2021 8:55:04 AM By: Worthy Keeler PA-C Signed: 10/28/2021 4:35:11 PM By: Massie Kluver Entered By: Massie Kluver on 10/26/2021 15:11:25 [name] ([id number]) -------------------------------------------------------------------------------- Problem List Details Patient Name: [name] Date of Service: 10/26/2021 9:45 AM Medical Record Number: [id number] Patient Account Number: [id number] Date of Birth/Sex: [birth year] (71 y.o. M) Treating RN: Cornell Barman Primary Care Provider: Fontaine No Other Clinician: Massie Kluver Referring Provider: Fontaine No Treating Provider/Extender: Skipper Cliche in Treatment: 39 Active Problems ICD-10 Encounter Code Description Active Date MDM Diagnosis E11.622 Type 2 diabetes mellitus with other skin ulcer 01/26/2021 No Yes I89.0 Lymphedema, not elsewhere classified 01/26/2021 No Yes I87.2 Venous insufficiency (chronic) (peripheral) 01/26/2021 No Yes L97.822 Non-pressure chronic ulcer of other part of left lower leg with fat layer 01/26/2021 No Yes exposed L97.512 Non-pressure chronic ulcer of other part of right foot with fat layer 01/26/2021 No Yes exposed [place] (primary) hypertension 01/26/2021 No Yes L97.811 Non-pressure chronic ulcer of other part of right lower leg limited to 02/15/2021 No Yes breakdown of skin Inactive Problems Resolved Problems Electronic Signature(s) Signed: 10/26/2021 10:07:05 AM By: Worthy Keeler PA-C Entered By: Worthy Keeler on 10/26/2021 10:07:05 [name] ([id number]) -------------------------------------------------------------------------------- Progress Note Details Patient Name: [name] Date of Service: 10/26/2021 9:45 AM Medical Record Number: [id number] Patient Account Number: [id number] Date of Birth/Sex: [birth year] (71 y.o. M) Treating RN: Cornell Barman Primary Care Provider: Fontaine No Other Clinician: Massie Kluver Referring Provider: Fontaine No Treating Provider/Extender: Skipper Cliche in Treatment: 39 Subjective Chief Complaint Information obtained from Patient Left LE ulcers History of Present Illness (HPI) 71 year old male who presented to the ER with bilateral lower extremity blisters which had started last week. he has a past medical history of leukemia, diabetes mellitus, hypertension, edema of both lower extremities, his recurrent skin infections, peripheral vascular disease, coronary artery disease, congestive heart failure and peripheral neuropathy. in the ER he was given Rocephin and put on Silvadene cream. he was put on oral doxycycline and was asked to follow-up with the [facility]. His last hemoglobin A1c was 6.6 in December and he checks his blood sugar once a week. He does not have any physicians outside the [place] system. He does not recall any vascular duplex studies done either for arterial or venous disease but was told to wear compression stockings which he does not use 05/30/2016 -- we have not yet received any of his notes from the [facility] hospital system and his arterial and venous duplex studies are scheduled here in [place] around mid February. We are unable to have his insurance accepted by home health agencies and hence he is getting dressings only once a week. 06/06/16 -- -- I received a call from the patient's PCP at the [place] at [facility] and spoke to  Dr. Garvin Fila, phone number [phone] and fax number [phone]. She confirmed that no vascular testing was done over the last 5 years and she would be happy to do them if the patient did want them to be done at the [place] and we could fax him a request. Readmission: 71 year old male seen by as in February of this year and was referred to vein and vascular for studies and opinion from the vascular surgeons. The patient returns today with a fresh problem having had blisters on his left lower extremity which have been there for about 5 days and  he clearly states that he has been wearing his compression stockings as advised though he could not read the moderate compression and has been wearing light compression. Review of his electronic medical records note that he had lower extremity arterial duplex examination done on 06/23/2016 which showed no hemodynamically significant stenosis in the bilateral lower extremity arterial system. He also had a lower extremity venous reflux examination done on 07/07/2016 and it was noted that he had venous incompetence in the right great saphenous vein and bilateral common femoral veins. Patient was seen by Dr. Tamala Julian on the same day and for some reason his notes do not reflect the venous studies or the arterial studies and he recommended patient do a venous duplex ultrasound to look for reflux and return to see him.he would also consider a lymph pump if required. The patient was told that his workup was normal and hence the patient canceled his follow-up appointment. 02/03/17 on evaluation today patient left medial lower extremity blister appears to be doing about the same. It is still continuing to drain and there's still the blistered skin covering the wound bed which is making it difficult for the alternate to do its job. Fortunately there is no evidence of cellulitis. No fevers chills noted. Patient states in general he is not having any significant discomfort. Patient's lower extremity arterial duplex exam revealed that patient was hemodynamically stable with no evidence of stenosis in regard to the bilateral lower extremities. The lower extremity venous reflux exam revealed the patient had venous incontinence noted in the right greater saphenous and bilateral common femoral vein. There is no evidence of deep or superficial vein thrombosis in the bilateral lower extremities. Readmission: 11/12/18 Patient presents for evaluation our clinic today concerning issues that he is having with his left lower  extremity. He tells me that a couple weeks ago he began developing blisters on the left lower extremity along with increased swelling. He typically wears his compression stockings on a regular basis is previously been evaluated both here as well is with vascular surgery they would recommend lymphedema pumps but unfortunately that somehow fell through and he never heard anything back from that. Nonetheless I think lymphedema pumps would be beneficial for this patient. He does have a history of hypertension and diabetes. Obviously the chronic venous stasis and lymphedema as well. At this point the blisters have been given in more trouble he states sometimes when the blisters openings able to clean it down with alcohol and it will dry out and do well. Unfortunately that has not been the case this time. He is having some discomfort although this mean these with cleaning the areas he doesn't have discomfort just on a regular basis. He has not been able to wear his compression stockings since the blisters arose due to the fact that of course it will drain into the socks causing additional issues and he didn't have  any way to wrap this otherwise. He has increased to taking his Lasix every day instead of every other day. He sees his primary care provider later this month as well. No fevers, chills, nausea, or vomiting noted at this time. 11/19/18-Patient returns at 1 week, per intake RN the amount of seepage into the compression wraps was definitely improved, overall all the wounds are measuring smaller but continuing silver alginate to the wounds as primary dressing 11/26/18 on evaluation today patient appears to be doing quite well in regard to his left lower [place] ulcers. In fact of the areas that were noted initially he only has two regions still open. There is no evidence of active infection at this time. He still is not heard anything from the company regarding lymphedema pumps as of yet. Again as  previously seen vascular they have not recommended any surgical intervention. [name], [name] ([id number]) 12/03/2018 on evaluation today patient actually appears to be doing quite well with regard to his lower extremity ulcers. In fact most of the areas appear to be healed the one spot which does not seem to be completely healed I am unsure of whether or not this is really draining that much but nonetheless there does not appear to be any signs of infection or significant drainage at this point. There is no sign of fever, chills, nausea, vomiting, or diarrhea. Overall I am pleased with how things have progressed I think is very close to being able to transition to his home compression stockings. 12/10/2018 upon evaluation today patient appears to be doing quite well with regard to his left lower extremity. He has been tolerating the dressing changes without complication. Fortunately there is no signs of active infection at this time. He appears after thorough evaluation of his leg to only have 1 small area that remains open at this point everything else appears to be almost completely closed. He still have significant swelling of the left lower extremity. We had discussed discussing this with his primary care provider he is not able to see her in person they were at the [facility] and right now the [place] is not seeing patients on site. According to the patient anyway. Subsequently he did speak with her apparently and his primary care provider feels that he may likely have a DVT. With that being said she has not seen his leg she is just going off of his history. Nonetheless that is a concern that the patient now has as well and while I do not feel the DVT is likely we can definitely ensure that that is not the case I will go ahead and see about putting that order in today. Nonetheless otherwise I am in a recommend that we continue with the current wound care measures including the compression therapy most  likely. We just need to ensure that his leg is indeed free of any DVTs. 12/17/2018 on evaluation today patient actually appears to be completely healed today. He does have 2 very small areas of blistering although this is not anything too significant at this point which is good news. With that being said I am in agreement with the fact that I think he is completely healed at this point. He does want to get back into his compression stocking. The good news is we have gotten approval from insurance for his lymphedema pumps we received a letter since last saw him last week. The other good news is his study did come back and showed no evidence of  a DVT. 12/20/2018 on evaluation today patient presents for follow-up concerning his ongoing issues with his left lower extremity. He was actually discharged last Friday and did fairly well until he states blisters opened this morning. He tells me he has been wearing his compression stocking although he has a hard time getting this on. There does not appear to be any signs of active infection at this time. No fevers, chills, nausea, vomiting, or diarrhea. 12/27/2018 on evaluation today patient appears to be doing very well with regard to his swelling of the left lower extremity the 4 layer compression wrap seems to have been beneficial for him. Fortunately there is no signs of active infection at this time. Patient has been tolerating the compression wrap without complication and his foot swelling in particular appears to be greatly improved. He does still have a wound on the lateral portion of his left leg I believe this is more of a blister that has now reopened. 01/03/2019 on evaluation today patient actually appears to be doing excellent in regard to his left lower extremity. He did receive his compression pumps and is actually use this 7 times since he was last here in the office. On top of the compression wrap he is now roughly 3 cm better at the calf and 2 cm  better at the ankle he also states that his foot seem to go an issue better without even having to use a shoe horn. Obviously I think this is all evidence that he is doing excellent in this regard. The other good news is he does not appear to have anything open today as far as wounds are concerned. 01/15/2019 on evaluation today patient appears to be doing more poorly yet again with regard to his left lower extremity. He has developed new wounds again after being discharged just recently. Unfortunately this continues to be the case that he will heal and then have subsequent new wounds. The last time I was hopeful that he may not end up coming back too quickly especially since he states he has been using his lymphedema pumps along with wearing his compression. Nonetheless he had a blister on the back of his leg that popped up on the left and this has opened up into an ulceration it is quite painful. 01/22/19 on evaluation today patient actually appears to be doing well with regard to his wound on the left lower extremity. He's been tolerating the dressing changes without complication including the compression wrap in the wound appears to be significantly smaller today which is great news. Overall very pleased in this regard. 01/29/2019 on evaluation today patient appears to be doing well with regard to his left posterior lower extremity ulcer. He has been tolerating the dressing changes without complication. This is not completely healed but is getting much closer. We did order a Farrow wrap 4000 for him he has received this and has it with him today although I am not sure we are quite ready to start him on that as of yet. We are very close. 02/05/2019 on evaluation today patient actually appears to be doing quite well with regard to his left posterior lower extremity ulcer. He still has a very tiny opening remaining but the fortunate thing is he seems to be healing quite nicely. He also did get his Farrow  wrap which I am hoping will help with his edema control as well at home. Fortunately there is no evidence of active infection. 02/12/2019 patient and fortunately appears to  be doing poorly in regard to his wounds of the left lower extremity. He was very close to healing therefore we attempted to use his Velcro compression wraps continuing with lymphedema pumps at home. Unfortunately that does not seem to have done very well for him. He tells me that he wore them all the time but again I am not sure why if that is the case that he is having such significant edema. He is still on his fluid pills as well. With that being said there is no obvious sign of infection although I do wonder about the possibility of infection at this time as well. 02/19/2019 unfortunately upon evaluation today patient appears to be doing more poorly with regard to his left lower extremity. He is not showing signs of significant improvement and I think the biggest issue here is that he does have an infection that appears to likely be Pseudomonas. That is based on the blue-green drainage that were noted at this time. Unfortunately the antibiotic that has been on is not going to take care of this at all. I think they will get a need to switch him to either Levaquin or Cipro and this was discussed with the patient. 02/26/2019 on evaluation today patient's lower extremity on the left appears to be doing significantly better as compared to last evaluation. Fortunately there is no signs of active infection at this time. He has been tolerating the compression wrap without complication in fact he made it the whole week at this point. He is showing signs of excellent improvement I am very happy in this regard. With that being said he is having some issues with infection we did review the results of his culture which I noted today. He did have a positive finding for Enterobacter as well as Alcaligenes faecalis. Fortunately the Levaquin that  I placed him on will work for both which is great news. There is no signs of systemic infection at this point. 10/30; left posterior leg wound in the setting of very significant edema and what looks like chronic venous inflammation. He has compression pumps but does not use them. We have been using 3 layer compression. Silver alginate to the wound as the primary dressing 03/18/2019 on evaluation today patient appears to be doing a little better compared to last time I saw him. He really has not been using his compression pumps he tells me that he is having too much discomfort. He has been keeping his wraps on however. He is only been taking his fluid pills every other day because he states they are not really helping and he has an appointment with his primary care provider at the [facility] tomorrow. Subsequently the wound itself on the left lower extremity does seem to be greatly improved compared to previous. [name], [name] ([id number]) 03/25/2019 on evaluation today patient appears to be doing better with regard to his wounds on the bilateral lower extremities. The left is doing excellent the right is also doing better although both still do show some signs of open wounds noted at this point unfortunately. Fortunately there is no signs of active infection at this time. The patient also is not really having any significant pain which is good news. Unfortunately there was some confusion with the referral on vascular disease and as far as getting the patient scheduled there can be contacting him later today to do this fortunately we got this straightened out. 04/01/2019 on evaluation today patient appears to be doing no fevers, chills, nausea,  vomiting, or diarrhea. Excellent at this time with regard to his lower extremities. There does not appear to be any open wound at this point which is good news. Fortunately is also no signs of active infection at this time. Overall feel like the patient has done excellent  with the compression the problem is every time we got him to this point and then subsequently go to using his own compression things just go right back to where they were. I am not sure how to address this we can try to get an appointment with vascular for 2 weeks now they have yet to call him. Obviously this has become frustrating for the patient as well. I think the issue has just been an honest error as far as scheduling is concerned but nonetheless still worn out the point where I am unsure of which direction we should take. 04/08/2019 on evaluation today patient actually appears to be doing well with regard to his lower extremities. There are no open wounds at this time and things seem to be managing quite nicely as far as the overall edema control is concerned. With that being said he does have his compression socks today for [place] to go ahead and reinitiate therapy in that manner at this point. He is going to be going for shoes to be measured on Wednesday and then coincidentally he will also be seeing vascular on Thursday. Overall I think this is good news and again I am hopeful that they will be able to do something for him to help prevent ongoing issues with edema control as well. No fevers, chills, nausea, vomiting, or diarrhea. 04/11/2019 on evaluation today patient actually appears to be doing poorly after just being discharged on Monday of this week. He had been experiencing issues with again blisters especially on the left lower extremity. With that being said he was completely healed and appeared to be doing great this past Monday. He then subsequently has new blisters that formed before his appointment with vascular this morning. He was also measured for shoes in the interim. With that being said we may have figured out what exactly is going on and why he continues to have issues like what we are seeing at this point. He takes his compression stockings off at nighttime and then he ends up  having to sleep in his chair for 5-6 hours a night. He sleeps with his feet down he cannot really get him up in the recliner and therefore he is sleeping and the worst possible his position with his feet on the floor for that majority of the time. Again as I explained to him that is about one third at minimum at least one fourth of his day that he spending with his feet dangling down on the ground and the worst possible position they could be. I think this may be what is causing the issue. Subsequently I am leaning toward thinking that he may need a hospital bed in order to elevate his legs. We likely can have to coordinate this with his primary care provider at the [facility]. Readmission: 01/26/2021 this is a patient who presents for repeat evaluation here in the clinic although it is actually been couple of years since have seen him in fact it was December 2020 when I last saw him. Subsequently he never really healed but did end up being lost to follow-up. He tells me has been having issues ongoing with his lower extremities has bilateral lower extremity lymphedema  no real significant or definitive open wounds but in general his lymphedema is way out of control. We were never able to refer him to lymphedema clinic simply due to the fact to be honest we were never able to get him completely healed. I do not see anyone with open wounds. The patient does have evidence of type 2 diabetes mellitus, lymphedema, chronic venous insufficiency, and hypertension. That really has not changed since his last evaluation. 02/09/2021 upon evaluation today patient appears to be doing a little better in regard to his legs although he still having a tremendous amount of drainage especially on the left leg. Fortunately there does not appear to be any evidence of active infection. Of note when we looked into this further it appears that the patient did not have any absorptive dressing on it was just the 4-layer  compression wrap. Nonetheless this is probably big part of the issue here. 10/10; he comes in today with 3 large areas on the upper right lower leg likely remanence of denuded blistering under his compression wraps. He has no other wounds on the right. On the left he has the denuded area on the left medial foot and ankle and on the left dorsal foot. Massive lymphedema in both feet dorsally. Using Zetuvit under compression We have increased home health visitation to twice a week to change the dressings and will change it once 02/22/2021 upon evaluation today patient appears to be doing well currently with regard to his wounds. He has been tolerating the dressing changes without complication. Fortunately there does not appear to be any evidence of active infection which is great news. No fevers, chills, nausea, vomiting, or diarrhea. The biggest issue I see currently is that home health is not putting any medicine on the actual wounds before wrapping. 03/01/2021 upon evaluation today the patient's right leg actually appears to be doing quite well which is great news there does not appear to be any evidence of active infection at this time. No fevers, chills, nausea, vomiting, or diarrhea. With that being said the patient is having issues on the left foot where he is having significant drainage is also an ammonia smell he does not have any animals at home and this makes me concerned about a bacteria producing urea as a byproduct. Again the possible common organisms will be E. coli, Proteus, and Enterococcus. All 3 of which can be successfully treated with Levaquin. For that reason I think that this may be a good option for [place] to consider placing him on and I did obtain a culture as well for confirmation sake. 03/08/2021 upon evaluation today patient appears to be doing unfortunately still somewhat poorly in regard to his leg ulcerations. He actually has an area on the right leg where he blistered due  to the fact that his wrap slid down and caused an area of pinching on his skin and this has led to a significant issue here. 03/15/2021 upon evaluation today patient unfortunately has not been wrapped appropriately with absorptive dressings nor with the appropriate technique for the third layer of the 4-layer compression wrap. These are issues that we continue to try to address with the home health nurse. Also the absorptive dressing that she had was cut in half and therefore that causes things to leak out it does not actually trap the fluid in regard to the top of the foot overall I think that all these combined are really not seeing things improved significantly here. Fortunately there does  not appear to be any signs of significant infection at this time which is good news. He still is having a tremendous amount of drainage. 03/22/2021 upon evaluation today patient appears to be draining tremendously. He still continues to tell me that he is using his pumps 2 times a day and that coupled with that tells me that he is elevating his legs as well. With that being said all things considered I am really just not seeing the improvement we would expect to see with the 4-layer compression wrap and all the above noted. He in fact had an extremely large Zetuvit dressing on both legs and that they were extremely filled to the max with fluid. This is after just being changed just before the weekend and this [name], [name] ([id number]) is Monday. Nonetheless I am concerned about the fact that there is something going on fairly significant that we cannot get any of this under control and that he is draining this significantly. He supposed be having an echocardiogram it sounds like scheduling has been an issue for him as far as getting in sooner. Its something to do with needing his cousin to drive him because of where it sat and he cannot drive himself to this appointment either way I really think he needs to try to  see what he can do about making this happen a little sooner. He tells me he will call today. 03/29/2021 on evaluation today patient appears to be doing about the same in regard to his legs. He did get his cardiology appointment moved up to 6 December which is at least good that is better than what it was before mid December. Overall very pleased in that regard. 04/05/2021 upon evaluation today patient unfortunately is still doing fairly poorly. There does not appear to be any signs of active infection at this time. No fevers, chills, nausea, vomiting, or diarrhea. Unfortunately I think until his edema is under control and overall fluid overload there is really not to be much chance that I can do much to get him better. This is quite unfortunate and frustrating both for myself and the patient to be perfectly honest. Nonetheless I think that he really needs to have a conversation both with his primary care provider as well as cardiologist he sees the PCP on Monday and cardiology on Wednesday of next week. 04/13/2021 upon evaluation today patient appears to be doing poorly in regard to his bilateral lower extremities his left is still worse than the right. With that being said he has a tremendous amount of drainage he did see his primary care provider yesterday there really was not much there to be done from their perspective. He sees cardiology tomorrow. Nonetheless my biggest concern here is simply that if we do not get the edema under control he is going to continue to have drainage and honestly I think at some point he is going to become infected severely that is my main concern. 04/19/2021 upon evaluation today patient appears to be doing poorly still in regard to his legs. Unfortunately there does not appear to be any signs of infection at this point. He does have a tremendous amount of drainage however. We have not seen the results back from the cardiologist and the echocardiogram that was done.  It appears that the patient checked out okay as far as that is concerned with regard to ejection fraction though we still have some issues here to be honest with his diastolic function. I am unsure  if this is accounting for everything that we are seeing or not. Either way he has a tremendous amount of drainage from his legs that we are just not able to control in the outpatient setting at this point. I have reached out to Dr. Rockey Situ his cardiologist to see once he reviews the sheet if there is anything that he feels like can be done from an outpatient perspective if not then I think the way to go is probably can to be through inpatient admission and diuresis. Otherwise I am not sure how working to get this under control we tried antibiotics, compression wrapping, and I have told the patient to be elevating his legs I am not sure how much he does of this but either way I think that this is still an ongoing issue nonetheless. 04/26/2021 upon evaluation today patient appears to be doing poorly in regard to his legs. He is having a tremendous amount of fluid at this point which is quite unfortunate. Its to the point that he may have had at least 5 to 10 pounds of fluid in his dressings this morning when they were removed these were changed this Friday. Subsequently I think he needs to go to the ER for further evaluation and treatment I think is probably can need diuresis possibly even IV antibiotics been on what the blood work looks like but in general I feel like he needs something to get this under control from an outpatient perspective absent of everywhere I can think of and I cannot get this under control with our traditional measures. I think this is going require more so that we can get him better 12/30; this is a patient with severe bilateral lymphedema. He was hospitalized from 04/26/2021 through 04/29/2021 treated for cellulitis in the setting of lower extremity ulcers and lymphedema. After he  left the hospital he is apparently seen for nurse visit our staff contacted cardiology and he has been started on Lasix 40 mg. Apparently his legs have less edema. Lab work from 05/04/2021 showed a BUN of 38 and creatinine of 1.59 these are elevated versus previous where his creatinine seems to have been 1.30 on 12/19 his potassium is 4.3. I believe the lab work is being followed by cardiology We have him in a 4-layer wrap. Xeroform on the leg wounds and sit to fit on the Berry damage skin on the left dorsal foot versus right dorsal foot. He has compression pumps but does not use them. We have apparently not yet ordered him compression stockings 05/17/2021 upon evaluation today patient's legs though better than last time I personally saw him appear to be getting worse compared to where they were previous. Dr. Quentin Cornwall was actually last 1 to see you I have not seen him since 19 December. That was before he went into the ER. Coming out apparently his legs looked also and they still look better but not as good as they were in the past. 1/16; patient with severe bilateral lymphedema. Severe scaled hyperkeratotic skin on the dorsal aspect of his distal left foot and left medial ankle.. On the right side changes are not as bad. He did not have any weeping edema. Our intake nurse was convinced that he is being compliant with compression pumps 1 hour twice a day 05/31/2021 upon evaluation today patient actually appears to be doing a little bit better in my opinion in regard to his feet. I do not see as much drainage and it being just completely wet  as it was previous. Fortunately I do not also see any signs of active infection which is great news as well. 06/07/21 Upon inspection patient's wound bed actually showed signs of doing well he is not nearly as weepy and wet as he has been in the past and overall very pleased in that regard. Fortunately I do not see any signs of active infection locally or nor  systemically at this time. Which is great news. No fevers, chills, nausea, vomiting, or diarrhea. 06/14/2021 upon evaluation today patient appears to be doing well with regard to his right foot I am pleased in that regard. His left foot is still draining quite a bit despite using lymphedema pumps, 4-layer compression wraps, and he tells me elevating his legs as well. He also has Lasix that he takes twice a day. Nonetheless I believe that this is still good to be an ongoing issue. We have a hard time getting this under control as far as the swelling is concerned. 06/21/2021 upon evaluation today patient appears to be doing decently well in regard to his wounds all things considered. He still has a tremendous amount of drainage and fluid noted at this point. Fortunately I do not see any signs of active infection locally or systemically at this point which is great news. Nonetheless I am unsure where to go and how to do this as far as trying to limit his swelling and weeping from his toes in particular. 06/28/2021 upon evaluation patient unfortunately continues to have significant drainage from his feet. We have been keeping him in a compression wrap and despite this he still continues to have extreme fluid issues he seen his cardiologist he is seeing the nephrologist. We really cannot find any way to get this under control when he did well was when he was in the hospital and they got some of the fluid away. But outside of that we are just struggling to achieve the long-term goal of getting this under control and keeping it under control unfortunately. 07/05/2021 upon evaluation today patient appears to be doing poorly in regard to his feet. Unfortunately this continues to be a significant issue and to be honest I am really not certain what to do about it. I referred him to Dr. Randol Kern at [facility] and he does have an appointment although it is [name], [name] ([id number]) 12 April. He also sees his primary care  provider on 6 April. He did not want to see Dr. Haynes Kerns until after he saw his PCP that is the reason the appointment so far out. There is really not much I can do in that regard. Nonetheless I do think that we are still continue to have significant lymphedema issues with significant mount of weeping in regard to the feet and again this has just become extremely difficult to manage to be honest I am not sure if there is something else that Dr. Haynes Kerns or someone else could recommend he also will be seeing Dr. Dellia Nims in 2 weeks when I am on vacation and at that time I will see if Dr. Dellia Nims has any ideas about where to go from here in the meantime. 07/12/2021 upon evaluation today patient appears to be doing about as well as can be expected with regard to his feet. He does actually see his kidney doctor this Friday. He also will be seeing his primary care provider on April 4 and then following that around mid April he will be seeing Dr. Haynes Kerns at [facility] which was  a referral made for him. Again my goal is to try to find out some way to fix this and to be perfectly honest we have had some issues with making any good adjustments. When he was in the hospital and greater amounts of Lasix he was able to get this down and it looked much better upon discharge. With that being said right now things just are not doing nearly as good as what they used to be. 3/13; patient presents for follow-up. He has been using his lymphedema pumps over the past week. He reports an increase in his Lasix dose. He has no issues or complaints today. He denies signs of infection. 07/26/2021 upon evaluation today patient appears to be doing better in regard to his feet bilaterally. Both are showing signs of much less drainage which is great news and overall very pleased in that regard. Fortunately there does not appear to be any evidence of active infection locally or systemically at this time. No fevers, chills, nausea, vomiting, or  diarrhea. 08/02/2021 upon evaluation today patient appears to be doing well with regard to his feet. Both are showing signs of being drier the right pretty much has not really draining much at all which is great news. The left is not draining anywhere close to his much as it was during the last evaluation. This is excellent news and overall very pleased. 08/09/2021 upon evaluation today patient appears to be doing well with regard to his legs the right leg especially showing signs of excellent improvement which is great news I do not see any evidence of active infection locally or systemically which is great. In regard to the left leg he still has some weeping and drainage but nothing as significant as what it was in the past this is great news. 08-16-2021 upon evaluation today patient appears to actually be doing quite well in my opinion in regard to his feet. This is significantly improved compared to what we previously seen and overall I am extremely pleased in that regard. I do believe that He is actually improving although this is obviously very slow going. 08-23-2021 upon evaluation today patient appears to be doing well currently in regard to his right leg which actually is pretty dry at this point today. Fortunately I do not see any evidence of active infection at this time which is great news. No fevers, chills, nausea, vomiting, or diarrhea. 08-30-2021 upon evaluation today patient appears to be doing well with regard to his lower extremities. The right foot is pretty much completely dry which is great news the left foot though not completely dry seems to be doing decently well. I do not see any signs of active infection locally or systemically which is great news. No fevers, chills, nausea, vomiting, or diarrhea. 09-06-2021 upon evaluation today patient appears to be doing well with regard to his feet in fact now the left foot is almost completely dry as well and I am definitely seeing a lot of  significant improvement. With that being said unfortunately he actually appears to have some cellulitis of his right thigh. His toes are also little bit red but this may just be due to the increased swelling. He really is not warm to touch in regard to the toes. 5/8; excellent edema control on the right foot and lower leg there is no open wounds but we continue to put compression on this otherwise this will breakdown. He is using his compression pumps twice a day at home The  area that is problematic is on the left dorsal foot some areas that are not fully epithelialized with very dry fissured skin over this area. 09-20-2021 upon evaluation today patient appears to be doing a little bit worse in regard to swelling at this time. Fortunately I do not see any signs of infection with that being said the wrap was not on quite as well as what I would like to have seen. I do believe that this has caused a little bit of excess swelling and again we need to try to get this under good control. 09-27-2021 upon evaluation today patient appears to be doing awesome in regard to his feet and legs. Everything is measuring smaller the swelling is down and to be perfectly honest I am extremely pleased with the end of his feet especially on the left side and how dry this is today. I do think we are on the right track here. 5/31; patient presents for follow-up. He is using nystatin powder to the feet bilaterally under 4-layer compression. He has no issues or complaints today. He states he is going to the lymphedema clinic tomorrow. Since he has no open wounds on the right lower extremity they will be focused on the side. 10-11-2021 upon evaluation today patient appears to be doing excellent in regard to his legs. He has been tolerating the dressing changes without complication. Fortunately there does not appear to be any evidence of active infection locally or systemically at this time which is great news. No fevers, chills,  nausea, vomiting, or diarrhea. 10-18-2021 upon evaluation today patient appears to be doing well with regard to his legs. He has been tolerating the dressing changes without complication and actually seen in lymphedema clinic now in regard to his right leg were taken care of the left leg. Fortunately I do not see any evidence of active infection locally or systemically at this time. 10-26-2021 upon evaluation today patient's wounds actually are showing signs of doing well in fact he really does not have wounds as much is weeping of the lower extremities. Fortunately I do not see any evidence of active infection locally or systemically which is great news. No fever or chills noted Objective [name], [name] ([id number]) Constitutional Obese and well-hydrated in no acute distress. Vitals Time Taken: 9:44 AM, Weight: 312 lbs, Temperature: 98.0 F, Pulse: 56 bpm, Respiratory Rate: 18 breaths/min, Blood Pressure: 130/75 mmHg. Respiratory normal breathing without difficulty. Psychiatric this patient is able to make decisions and demonstrates good insight into disease process. Alert and Oriented x 3. pleasant and cooperative. General Notes: Upon inspection patient's wound bed actually showed signs of good appearance to the legs in general he is actually significantly smaller compared to what has been previous. Fortunately I do not see any evidence of active infection locally or systemically which is great news. 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 I would recommend that we go ahead and  continue with the wound care measures as before and the patient is in agreement with plan this includes the use of the 4-layer compression wrap bilaterally. 2. I am also can recommend that we have the patient continue to  monitor for any signs of worsening or infection. If anything changes he should let me know. Organ using the Tubigrip over the ends of the feet how he been doing. 3. I did discuss with Helene Kelp from the lymphedema clinic yesterday the fact that the patient is decompressed really well in her opinion she does not think there is much she can do further. With that being said she did get the information for an area where we could try to get some compression for the patient custom made as well as compression shoes this is at [facility]. I Georgina Peer try to get in touch with them and see what we can find out can be done he is a very difficult case and I think this is going to take a little bit of work to figure out the right way to go here. Patient is in agreement with that plan. We will see patient back for reevaluation in 1 week here in the clinic. If anything worsens or changes patient will contact our office for additional recommendations. Electronic Signature(s) Signed: 10/26/2021 11:03:33 AM By: Worthy Keeler PA-C Entered By: Worthy Keeler on 10/26/2021 11:03:33 [name] ([id number]) -------------------------------------------------------------------------------- SuperBill Details Patient Name: [name] Date of Service: 10/26/2021 Medical Record Number: [id number] Patient Account Number: [id number] Date of Birth/Sex: June 30, 1950 (71 y.o. M) Treating RN: Cornell Barman Primary Care Provider: Fontaine No Other Clinician: Massie Kluver Referring Provider: Fontaine No Treating Provider/Extender: Skipper Cliche in Treatment: 39 Diagnosis Coding ICD-10 Codes Code Description E11.622 Type 2 diabetes mellitus with other skin ulcer I89.0 Lymphedema, not  elsewhere classified I87.2 Venous insufficiency (chronic) (peripheral) L97.822 Non-pressure chronic ulcer of other part of left lower leg with fat layer exposed L97.512 Non-pressure chronic ulcer of other part of right foot with fat layer exposed I10 Essential (primary) hypertension L97.811 Non-pressure chronic ulcer of other part of right lower leg limited to breakdown of skin Facility Procedures CPT4: Description Modifier Quantity Code [id number] BILATERAL: Application of multi-layer venous compression system; leg (below knee), including 1 ankle and foot. Physician Procedures CPT4 Code: [id number] Description: 85631 - WC PHYS LEVEL 4 - EST PT Modifier: Quantity: 1 CPT4 Code: Description: ICD-10 Diagnosis Description E11.622 Type 2 diabetes mellitus with other skin ulcer I89.0 Lymphedema, not elsewhere classified I87.2 Venous insufficiency (chronic) (peripheral) L97.822 Non-pressure chronic ulcer of other part of left lower  leg with fat layer Modifier: exposed Quantity: Electronic Signature(s) Signed: 10/27/2021 8:55:04 AM By: Worthy Keeler PA-C Signed: 10/28/2021 4:35:11 PM By: Massie Kluver Previous Signature: 10/26/2021 11:04:14 AM Version By: Worthy Keeler PA-C Entered By: Massie Kluver on 10/26/2021 15:12:10

## 2021-10-27 ENCOUNTER — Ambulatory Visit: Payer: PPO | Admitting: Occupational Therapy

## 2021-10-28 NOTE — Progress Notes (Signed)
[name] ([id number]) Visit Report for 10/26/2021 Arrival Information Details Patient Name: [name], [name] Date of Service: 10/26/2021 9:45 AM Medical Record Number: [id number] Patient Account Number: [id number] Date of Birth/Sex: April 26, 1951 (71 y.o. M) Treating RN: Cornell Barman Primary Care Paris Hohn: Fontaine No Other Clinician: Massie Kluver Referring Arnett Duddy: Fontaine No Treating Kei Mcelhiney/Extender: Skipper Cliche in Treatment: 39 Visit Information History Since Last Visit All ordered tests and consults were completed: No Patient Arrived: [name] Added or deleted any medications: Yes Arrival Time: 09:37 Any new allergies or adverse reactions: No Transfer Assistance: None Had a fall or experienced change in No Patient Requires Transmission-Based No activities of daily living that may affect Precautions: risk of falls: Patient Has Alerts: Yes Hospitalized since last visit: No Patient Alerts: AVVS consult on file Pain Present Now: No Last ABI-R 1.09; L 1.04 Electronic Signature(s) Signed: 10/28/2021 4:35:11 PM By: Massie Kluver Entered By: Massie Kluver on 10/26/2021 09:44:16 [name] ([id number]) -------------------------------------------------------------------------------- Clinic Level of Care Assessment Details Patient Name: [name] Date of Service: 10/26/2021 9:45 AM Medical Record Number: [id number] Patient Account Number: [id number] Date of Birth/Sex: Jul 22, 1950 (71 y.o. M) Treating RN: Cornell Barman Primary Care Jaelan Rasheed: Fontaine No Other Clinician: Massie Kluver Referring Lacora Folmer: Fontaine No Treating Zoya Sprecher/Extender: Skipper Cliche in Treatment: 39 Clinic Level of Care Assessment Items TOOL 1 Quantity Score '[]'  - Use when EandM and Procedure is performed on INITIAL visit 0 ASSESSMENTS - Nursing Assessment / Reassessment '[]'  - General Physical Exam (combine w/ comprehensive assessment (listed just below) when  performed on new 0 pt. evals) '[]'  - 0 Comprehensive Assessment (HX, ROS, Risk Assessments, Wounds Hx, etc.) ASSESSMENTS - Wound and Skin Assessment / Reassessment '[]'  - Dermatologic / Skin Assessment (not related to wound area) 0 ASSESSMENTS - Ostomy and/or Continence Assessment and Care '[]'  - Incontinence Assessment and Management 0 '[]'  - 0 Ostomy Care Assessment and Management (repouching, etc.) PROCESS - Coordination of Care '[]'  - Simple Patient / Family Education for ongoing care 0 '[]'  - 0 Complex (extensive) Patient / Family Education for ongoing care '[]'  - 0 Staff obtains Programmer, systems, Records, Test Results / Process Orders '[]'  - 0 Staff telephones HHA, Nursing Homes / Clarify orders / etc '[]'  - 0 Routine Transfer to another Facility (non-emergent condition) '[]'  - 0 Routine Hospital Admission (non-emergent condition) '[]'  - 0 New Admissions / Biomedical engineer / Ordering NPWT, Apligraf, etc. '[]'  - 0 Emergency Hospital Admission (emergent condition) PROCESS - Special Needs '[]'  - Pediatric / Minor Patient Management 0 '[]'  - 0 Isolation Patient Management '[]'  - 0 Hearing / Language / Visual special needs '[]'  - 0 Assessment of Community assistance (transportation, D/C planning, etc.) '[]'  - 0 Additional assistance / Altered mentation '[]'  - 0 Support Surface(s) Assessment (bed, cushion, seat, etc.) INTERVENTIONS - Miscellaneous '[]'  - External ear exam 0 '[]'  - 0 Patient Transfer (multiple staff / Civil Service fast streamer / Similar devices) '[]'  - 0 Simple Staple / Suture removal (25 or less) '[]'  - 0 Complex Staple / Suture removal (26 or more) '[]'  - 0 Hypo/Hyperglycemic Management (do not check if billed separately) '[]'  - 0 Ankle / Brachial Index (ABI) - do not check if billed separately Has the patient been seen at the hospital within the last three years: Yes Total Score: 0 Level Of Care: ____ [name] ([id number]) Electronic Signature(s) Signed: 10/28/2021 4:35:11 PM By: Massie Kluver Entered By: Massie Kluver on 10/26/2021 15:11:35 [name] ([id number]) -------------------------------------------------------------------------------- Compression Therapy Details Patient Name: [name] Date of Service: 10/26/2021  9:45 AM Medical Record Number: [id number] Patient Account Number: [id number] Date of Birth/Sex: 09/06/1950 (71 y.o. M) Treating RN: Cornell Barman Primary Care Patton Rabinovich: Fontaine No Other Clinician: Massie Kluver Referring Brecklynn Jian: Fontaine No Treating Mikhia Dusek/Extender: Skipper Cliche in Treatment: 39 Compression Therapy Performed for Wound Assessment: NonWound Condition Lymphedema - Right Leg Performed By: Clinician Cornell Barman, RN Compression Type: Four Layer Pre Treatment ABI: 1.1 Post Procedure Diagnosis Same as Pre-procedure Electronic Signature(s) Signed: 10/28/2021 4:35:11 PM By: Massie Kluver Entered By: Massie Kluver on 10/26/2021 15:09:49 [name] ([id number]) -------------------------------------------------------------------------------- Compression Therapy Details Patient Name: [name] Date of Service: 10/26/2021 9:45 AM Medical Record Number: [id number] Patient Account Number: [id number] Date of Birth/Sex: 01-27-1951 (72 y.o. M) Treating RN: Cornell Barman Primary Care Jojuan Champney: Fontaine No Other Clinician: Massie Kluver Referring Auston Halfmann: Fontaine No Treating Curlee Bogan/Extender: Skipper Cliche in Treatment: 39 Compression Therapy Performed for Wound Assessment: NonWound Condition Lymphedema - Left Leg Performed By: Clinician Cornell Barman, RN Compression Type: Four Layer Pre Treatment ABI: 1 Post Procedure Diagnosis Same as Pre-procedure Electronic Signature(s) Signed: 10/28/2021 4:35:11 PM By: Massie Kluver Entered By: Massie Kluver on 10/26/2021 15:09:58 [name] ([id number]) -------------------------------------------------------------------------------- Encounter  Discharge Information Details Patient Name: [name] Date of Service: 10/26/2021 9:45 AM Medical Record Number: [id number] Patient Account Number: [id number] Date of Birth/Sex: 11-21-1950 (71 y.o. M) Treating RN: Cornell Barman Primary Care Niharika Savino: Fontaine No Other Clinician: Massie Kluver Referring Roann Merk: Fontaine No Treating Alexander Aument/Extender: Skipper Cliche in Treatment: 72 Encounter Discharge Information Items Discharge Condition: Stable Ambulatory Status: Cane Discharge Destination: Home Transportation: Private Auto Accompanied By: self Schedule Follow-up Appointment: Yes Clinical Summary of Care: Electronic Signature(s) Signed: 10/28/2021 4:35:11 PM By: Massie Kluver Entered By: Massie Kluver on 10/26/2021 15:13:35 [name] ([id number]) -------------------------------------------------------------------------------- Lower Extremity Assessment Details Patient Name: [name] Date of Service: 10/26/2021 9:45 AM Medical Record Number: [id number] Patient Account Number: [id number] Date of Birth/Sex: July 21, 1950 (71 y.o. M) Treating RN: Cornell Barman Primary Care Elouise Divelbiss: Fontaine No Other Clinician: Massie Kluver Referring Stephana Morell: Fontaine No Treating Koty Anctil/Extender: Skipper Cliche in Treatment: 39 Edema Assessment Assessed: [Left: Yes] Patrice Paradise: Yes] Edema: [Left: No] [Right: No] Calf Left: Right: Point of Measurement: 38 cm From Medial Instep 35 cm 35.5 cm Ankle Left: Right: Point of Measurement: 14 cm From Medial Instep 24.5 cm 24.3 cm Knee To Floor Left: Right: From Medial Instep 28.6 cm Vascular Assessment Pulses: Dorsalis Pedis Palpable: [Left:Yes] [Right:Yes] Electronic Signature(s) Signed: 10/26/2021 12:21:47 PM By: Gretta Cool, BSN, RN, CWS, Kim RN, BSN Signed: 10/28/2021 4:35:11 PM By: Massie Kluver Entered By: Massie Kluver on 10/26/2021 10:14:30 [name]  ([id number]) -------------------------------------------------------------------------------- Multi Wound Chart Details Patient Name: [name] Date of Service: 10/26/2021 9:45 AM Medical Record Number: [id number] Patient Account Number: [id number] Date of Birth/Sex: Nov 13, 1950 (71 y.o. M) Treating RN: Cornell Barman Primary Care Zira Helinski: Fontaine No Other Clinician: Massie Kluver Referring Shanaia Sievers: Fontaine No Treating Hira Trent/Extender: Skipper Cliche in Treatment: 39 Vital Signs Height(in): Pulse(bpm): 56 Weight(lbs): 312 Blood Pressure(mmHg): 130/75 Body Mass Index(BMI): Temperature(F): 98.0 Respiratory Rate(breaths/min): 18 Wound Assessments Treatment Notes Electronic Signature(s) Signed: 10/28/2021 4:35:11 PM By: Massie Kluver Entered By: Massie Kluver on 10/26/2021 10:20:45 [name] ([id number]) -------------------------------------------------------------------------------- [place] Details Patient Name: [name] Date of Service: 10/26/2021 9:45 AM Medical Record Number: [id number] Patient Account Number: [id number] Date of Birth/Sex: Jan 23, 1951 (71 y.o. M) Treating RN: Cornell Barman Primary Care Syre Knerr: Fontaine No Other Clinician: Massie Kluver Referring Avyukth Bontempo: Fontaine No Treating Cyrilla Durkin/Extender: Skipper Cliche in Treatment: 72 Active Inactive  Soft Tissue Infection Nursing Diagnoses: Impaired tissue integrity Knowledge deficit related to disease process and management Knowledge deficit related to home infection control: handwashing, handling of soiled dressings, supply storage Potential for infection: soft tissue Goals: Patient/caregiver will verbalize understanding of or measures to prevent infection and contamination in the home setting Date Initiated: 09/06/2021 Date Inactivated: 10/18/2021 Target Resolution Date: 09/06/2021 Goal Status: Met Patient's soft tissue infection will  resolve Date Initiated: 09/06/2021 Date Inactivated: 10/18/2021 Target Resolution Date: 09/06/2021 Goal Status: Met Signs and symptoms of infection will be recognized early to allow for prompt treatment Date Initiated: 09/06/2021 Target Resolution Date: 10/29/2021 Goal Status: Active Interventions: Assess signs and symptoms of infection every visit Provide education on infection Treatment Activities: Education provided on Infection : 10/18/2021 Systemic antibiotics : 09/06/2021 Notes: Electronic Signature(s) Signed: 10/26/2021 12:21:47 PM By: Gretta Cool, BSN, RN, CWS, Kim RN, BSN Signed: 10/28/2021 4:35:11 PM By: Massie Kluver Entered By: Massie Kluver on 10/26/2021 10:20:26 [name] ([id number]) -------------------------------------------------------------------------------- Pain Assessment Details Patient Name: [name] Date of Service: 10/26/2021 9:45 AM Medical Record Number: [id number] Patient Account Number: [id number] Date of Birth/Sex: [birth year] (71 y.o. M) Treating RN: Cornell Barman Primary Care Declan Mier: Fontaine No Other Clinician: Massie Kluver Referring Lizania Bouchard: Fontaine No Treating Aero Drummonds/Extender: Skipper Cliche in Treatment: 39 Active Problems Location of Pain Severity and Description of Pain Patient Has Paino No Site Locations Pain Management and Medication Current Pain Management: Electronic Signature(s) Signed: 10/26/2021 12:21:47 PM By: Gretta Cool, BSN, RN, CWS, Kim RN, BSN Signed: 10/28/2021 4:35:11 PM By: Massie Kluver Entered By: Massie Kluver on 10/26/2021 09:46:18 [name] ([id number]) -------------------------------------------------------------------------------- Patient/Caregiver Education Details Patient Name: [name] Date of Service: 10/26/2021 9:45 AM Medical Record Number: [id number] Patient Account Number: [id number] Date of Birth/Gender: 1950-05-21 (71 y.o. M) Treating RN: Cornell Barman Primary Care Physician: Fontaine No Other Clinician: Massie Kluver Referring Physician: Fontaine No Treating Physician/Extender: Skipper Cliche in Treatment: 5 Education Assessment Education Provided To: Patient Education Topics Provided Infection: Handouts: Infection Prevention and Management Methods: Explain/Verbal Responses: State content correctly Electronic Signature(s) Signed: 10/28/2021 4:35:11 PM By: Massie Kluver Entered By: Massie Kluver on 10/26/2021 15:12:36 [name] ([id number]) -------------------------------------------------------------------------------- [place] Details Patient Name: [name] Date of Service: 10/26/2021 9:45 AM Medical Record Number: [id number] Patient Account Number: [id number] Date of Birth/Sex: June 29, 1950 (71 y.o. M) Treating RN: Cornell Barman Primary Care Semaj Kham: Fontaine No Other Clinician: Massie Kluver Referring Brisa Auth: Fontaine No Treating Alisia Vanengen/Extender: Skipper Cliche in Treatment: 39 Vital Signs Time Taken: 09:44 Temperature (F): 98.0 Weight (lbs): 312 Pulse (bpm): 56 Respiratory Rate (breaths/min): 18 Blood Pressure (mmHg): 130/75 Reference Range: 80 - 120 mg / dl Electronic Signature(s) Signed: 10/28/2021 4:35:11 PM By: Massie Kluver Entered By: Massie Kluver on 10/26/2021 09:46:11

## 2021-11-01 ENCOUNTER — Ambulatory Visit: Payer: PPO | Admitting: Occupational Therapy

## 2021-11-02 ENCOUNTER — Encounter: Payer: PPO | Admitting: Physician Assistant

## 2021-11-02 DIAGNOSIS — E11622 Type 2 diabetes mellitus with other skin ulcer: Secondary | ICD-10-CM | POA: Diagnosis not present

## 2021-11-02 NOTE — Progress Notes (Addendum)
ZAIVION, KUNDRAT ([id number]) Visit Report for 11/02/2021 Chief Complaint Document Details Patient Name: [name], [name] Date of Service: 11/02/2021 11:00 AM Medical Record Number: [id number] Patient Account Number: [id number] Date of Birth/Sex: 1950/12/11 (71 y.o. M) Treating RN: [name] Primary Care Provider: Fontaine No Other Clinician: Referring Provider: Fontaine No Treating Provider/Extender: Skipper Cliche in Treatment: 8 Information Obtained from: Patient Chief Complaint Left LE ulcers Electronic Signature(s) Signed: 11/02/2021 10:57:04 AM By: Worthy Keeler PA-C Entered By: Worthy Keeler on 11/02/2021 10:57:04 [name] ([id number]) -------------------------------------------------------------------------------- HPI Details Patient Name: [name] Date of Service: 11/02/2021 11:00 AM Medical Record Number: [id number] Patient Account Number: [id number] Date of Birth/Sex: [birth year] (71 y.o. M) Treating RN: [name] Primary Care Provider: Fontaine No Other Clinician: Referring Provider: Fontaine No Treating Provider/Extender: Skipper Cliche in Treatment: 16 History of Present Illness HPI Description: 71 year old male who presented to the ER with bilateral lower extremity blisters which had started last week. he has a past medical history of leukemia, diabetes mellitus, hypertension, edema of both lower extremities, his recurrent skin infections, peripheral vascular disease, coronary artery disease, congestive heart failure and peripheral neuropathy. in the ER he was given Rocephin and put on Silvadene cream. he was put on oral doxycycline and was asked to follow-up with the [facility]. His last hemoglobin A1c was 6.6 in December and he checks his blood sugar once a week. He does not have any physicians outside the [place] system. He does not recall any vascular duplex studies done either for arterial or venous disease but was told  to wear compression stockings which he does not use 05/30/2016 -- we have not yet received any of his notes from the [facility] hospital system and his arterial and venous duplex studies are scheduled here in [place] around mid February. We are unable to have his insurance accepted by home health agencies and hence he is getting dressings only once a week. 06/06/16 -- -- I received a call from the patient's PCP at the [facility] and spoke to Dr. Garvin Fila, phone number [phone] and fax number [phone]. She confirmed that no vascular testing was done over the last 5 years and she would be happy to do them if the patient did want them to be done at the [place] and we could fax him a request. Readmission: 71 year old male seen by as in February of this year and was referred to vein and vascular for studies and opinion from the vascular surgeons. The patient returns today with a fresh problem having had blisters on his left lower extremity which have been there for about 5 days and he clearly states that he has been wearing his compression stockings as advised though he could not read the moderate compression and has been wearing light compression. Review of his electronic medical records note that he had lower extremity arterial duplex examination done on 06/23/2016 which showed no hemodynamically significant stenosis in the bilateral lower extremity arterial system. He also had a lower extremity venous reflux examination done on 07/07/2016 and it was noted that he had venous incompetence in the right great saphenous vein and bilateral common femoral veins. Patient was seen by Dr. Tamala Julian on the same day and for some reason his notes do not reflect the venous studies or the arterial studies and he recommended patient do a venous duplex ultrasound to look for reflux and return to see him.he would also consider a lymph pump if required. The patient was told that his workup  was normal and hence the  patient canceled his follow-up appointment. 02/03/17 on evaluation today patient left medial lower extremity blister appears to be doing about the same. It is still continuing to drain and there's still the blistered skin covering the wound bed which is making it difficult for the alternate to do its job. Fortunately there is no evidence of cellulitis. No fevers chills noted. Patient states in general he is not having any significant discomfort. Patient's lower extremity arterial duplex exam revealed that patient was hemodynamically stable with no evidence of stenosis in regard to the bilateral lower extremities. The lower extremity venous reflux exam revealed the patient had venous incontinence noted in the right greater saphenous and bilateral common femoral vein. There is no evidence of deep or superficial vein thrombosis in the bilateral lower extremities. Readmission: 11/12/18 Patient presents for evaluation our clinic today concerning issues that he is having with his left lower extremity. He tells me that a couple weeks ago he began developing blisters on the left lower extremity along with increased swelling. He typically wears his compression stockings on a regular basis is previously been evaluated both here as well is with vascular surgery they would recommend lymphedema pumps but unfortunately that somehow fell through and he never heard anything back from that. Nonetheless I think lymphedema pumps would be beneficial for this patient. He does have a history of hypertension and diabetes. Obviously the chronic venous stasis and lymphedema as well. At this point the blisters have been given in more trouble he states sometimes when the blisters openings able to clean it down with alcohol and it will dry out and do well. Unfortunately that has not been the case this time. He is having some discomfort although this mean these with cleaning the areas he doesn't have discomfort just on a regular  basis. He has not been able to wear his compression stockings since the blisters arose due to the fact that of course it will drain into the socks causing additional issues and he didn't have any way to wrap this otherwise. He has increased to taking his Lasix every day instead of every other day. He sees his primary care provider later this month as well. No fevers, chills, nausea, or vomiting noted at this time. 11/19/18-Patient returns at 1 week, per intake RN the amount of seepage into the compression wraps was definitely improved, overall all the wounds are measuring smaller but continuing silver alginate to the wounds as primary dressing 11/26/18 on evaluation today patient appears to be doing quite well in regard to his left lower Trinity ulcers. In fact of the areas that were noted initially he only has two regions still open. There is no evidence of active infection at this time. He still is not heard anything from the company regarding lymphedema pumps as of yet. Again as previously seen vascular they have not recommended any surgical intervention. 12/03/2018 on evaluation today patient actually appears to be doing quite well with regard to his lower extremity ulcers. In fact most of the areas appear to be healed the one spot which does not seem to be completely healed I am unsure of whether or not this is really draining that much but nonetheless there does not appear to be any signs of infection or significant drainage at this point. There is no sign of fever, chills, nausea, vomiting, or diarrhea. Overall I am pleased with how things have progressed I think is very close to being able to transition  to his home compression stockings. SADIEL, MOTA ([id number]) 12/10/2018 upon evaluation today patient appears to be doing quite well with regard to his left lower extremity. He has been tolerating the dressing changes without complication. Fortunately there is no signs of active infection at this  time. He appears after thorough evaluation of his leg to only have 1 small area that remains open at this point everything else appears to be almost completely closed. He still have significant swelling of the left lower extremity. We had discussed discussing this with his primary care provider he is not able to see her in person they were at the [facility] and right now the [place] is not seeing patients on site. According to the patient anyway. Subsequently he did speak with her apparently and his primary care provider feels that he may likely have a DVT. With that being said she has not seen his leg she is just going off of his history. Nonetheless that is a concern that the patient now has as well and while I do not feel the DVT is likely we can definitely ensure that that is not the case I will go ahead and see about putting that order in today. Nonetheless otherwise I am in a recommend that we continue with the current wound care measures including the compression therapy most likely. We just need to ensure that his leg is indeed free of any DVTs. 12/17/2018 on evaluation today patient actually appears to be completely healed today. He does have 2 very small areas of blistering although this is not anything too significant at this point which is good news. With that being said I am in agreement with the fact that I think he is completely healed at this point. He does want to get back into his compression stocking. The good news is we have gotten approval from insurance for his lymphedema pumps we received a letter since last saw him last week. The other good news is his study did come back and showed no evidence of a DVT. 12/20/2018 on evaluation today patient presents for follow-up concerning his ongoing issues with his left lower extremity. He was actually discharged last Friday and did fairly well until he states blisters opened this morning. He tells me he has been wearing his compression  stocking although he has a hard time getting this on. There does not appear to be any signs of active infection at this time. No fevers, chills, nausea, vomiting, or diarrhea. 12/27/2018 on evaluation today patient appears to be doing very well with regard to his swelling of the left lower extremity the 4 layer compression wrap seems to have been beneficial for him. Fortunately there is no signs of active infection at this time. Patient has been tolerating the compression wrap without complication and his foot swelling in particular appears to be greatly improved. He does still have a wound on the lateral portion of his left leg I believe this is more of a blister that has now reopened. 01/03/2019 on evaluation today patient actually appears to be doing excellent in regard to his left lower extremity. He did receive his compression pumps and is actually use this 7 times since he was last here in the office. On top of the compression wrap he is now roughly 3 cm better at the calf and 2 cm better at the ankle he also states that his foot seem to go an issue better without even having to use a shoe horn. Obviously I  think this is all evidence that he is doing excellent in this regard. The other good news is he does not appear to have anything open today as far as wounds are concerned. 01/15/2019 on evaluation today patient appears to be doing more poorly yet again with regard to his left lower extremity. He has developed new wounds again after being discharged just recently. Unfortunately this continues to be the case that he will heal and then have subsequent new wounds. The last time I was hopeful that he may not end up coming back too quickly especially since he states he has been using his lymphedema pumps along with wearing his compression. Nonetheless he had a blister on the back of his leg that popped up on the left and this has opened up into an ulceration it is quite painful. 01/22/19 on evaluation  today patient actually appears to be doing well with regard to his wound on the left lower extremity. He's been tolerating the dressing changes without complication including the compression wrap in the wound appears to be significantly smaller today which is great news. Overall very pleased in this regard. 01/29/2019 on evaluation today patient appears to be doing well with regard to his left posterior lower extremity ulcer. He has been tolerating the dressing changes without complication. This is not completely healed but is getting much closer. We did order a Farrow wrap 4000 for him he has received this and has it with him today although I am not sure we are quite ready to start him on that as of yet. We are very close. 02/05/2019 on evaluation today patient actually appears to be doing quite well with regard to his left posterior lower extremity ulcer. He still has a very tiny opening remaining but the fortunate thing is he seems to be healing quite nicely. He also did get his Farrow wrap which I am hoping will help with his edema control as well at home. Fortunately there is no evidence of active infection. 02/12/2019 patient and fortunately appears to be doing poorly in regard to his wounds of the left lower extremity. He was very close to healing therefore we attempted to use his Velcro compression wraps continuing with lymphedema pumps at home. Unfortunately that does not seem to have done very well for him. He tells me that he wore them all the time but again I am not sure why if that is the case that he is having such significant edema. He is still on his fluid pills as well. With that being said there is no obvious sign of infection although I do wonder about the possibility of infection at this time as well. 02/19/2019 unfortunately upon evaluation today patient appears to be doing more poorly with regard to his left lower extremity. He is not showing signs of significant improvement and I  think the biggest issue here is that he does have an infection that appears to likely be Pseudomonas. That is based on the blue-green drainage that were noted at this time. Unfortunately the antibiotic that has been on is not going to take care of this at all. I think they will get a need to switch him to either Levaquin or Cipro and this was discussed with the patient. 02/26/2019 on evaluation today patient's lower extremity on the left appears to be doing significantly better as compared to last evaluation. Fortunately there is no signs of active infection at this time. He has been tolerating the compression wrap without complication in  fact he made it the whole week at this point. He is showing signs of excellent improvement I am very happy in this regard. With that being said he is having some issues with infection we did review the results of his culture which I noted today. He did have a positive finding for Enterobacter as well as Alcaligenes faecalis. Fortunately the Levaquin that I placed him on will work for both which is great news. There is no signs of systemic infection at this point. 10/30; left posterior leg wound in the setting of very significant edema and what looks like chronic venous inflammation. He has compression pumps but does not use them. We have been using 3 layer compression. Silver alginate to the wound as the primary dressing 03/18/2019 on evaluation today patient appears to be doing a little better compared to last time I saw him. He really has not been using his compression pumps he tells me that he is having too much discomfort. He has been keeping his wraps on however. He is only been taking his fluid pills every other day because he states they are not really helping and he has an appointment with his primary care provider at the [facility] tomorrow. Subsequently the wound itself on the left lower extremity does seem to be greatly improved compared to previous. 03/25/2019 on  evaluation today patient appears to be doing better with regard to his wounds on the bilateral lower extremities. The left is doing excellent the right is also doing better although both still do show some signs of open wounds noted at this point unfortunately. Fortunately there is no signs of active infection at this time. The patient also is not really having any significant pain which is good news. Unfortunately there was some confusion with the referral on vascular disease and as far as getting the patient scheduled there can be contacting him later today to do this COTY, LARSH ([id number]) fortunately we got this straightened out. 04/01/2019 on evaluation today patient appears to be doing no fevers, chills, nausea, vomiting, or diarrhea. Excellent at this time with regard to his lower extremities. There does not appear to be any open wound at this point which is good news. Fortunately is also no signs of active infection at this time. Overall feel like the patient has done excellent with the compression the problem is every time we got him to this point and then subsequently go to using his own compression things just go right back to where they were. I am not sure how to address this we can try to get an appointment with vascular for 2 weeks now they have yet to call him. Obviously this has become frustrating for the patient as well. I think the issue has just been an honest error as far as scheduling is concerned but nonetheless still worn out the point where I am unsure of which direction we should take. 04/08/2019 on evaluation today patient actually appears to be doing well with regard to his lower extremities. There are no open wounds at this time and things seem to be managing quite nicely as far as the overall edema control is concerned. With that being said he does have his compression socks today for [place] to go ahead and reinitiate therapy in that manner at this point. He is going to be  going for shoes to be measured on Wednesday and then coincidentally he will also be seeing vascular on Thursday. Overall I think this is good news and  again I am hopeful that they will be able to do something for him to help prevent ongoing issues with edema control as well. No fevers, chills, nausea, vomiting, or diarrhea. 04/11/2019 on evaluation today patient actually appears to be doing poorly after just being discharged on Monday of this week. He had been experiencing issues with again blisters especially on the left lower extremity. With that being said he was completely healed and appeared to be doing great this past Monday. He then subsequently has new blisters that formed before his appointment with vascular this morning. He was also measured for shoes in the interim. With that being said we may have figured out what exactly is going on and why he continues to have issues like what we are seeing at this point. He takes his compression stockings off at nighttime and then he ends up having to sleep in his chair for 5-6 hours a night. He sleeps with his feet down he cannot really get him up in the recliner and therefore he is sleeping and the worst possible his position with his feet on the floor for that majority of the time. Again as I explained to him that is about one third at minimum at least one fourth of his day that he spending with his feet dangling down on the ground and the worst possible position they could be. I think this may be what is causing the issue. Subsequently I am leaning toward thinking that he may need a hospital bed in order to elevate his legs. We likely can have to coordinate this with his primary care provider at the [facility]. Readmission: 01/26/2021 this is a patient who presents for repeat evaluation here in the clinic although it is actually been couple of years since have seen him in fact it was December 2020 when I last saw him. Subsequently he never really  healed but did end up being lost to follow-up. He tells me has been having issues ongoing with his lower extremities has bilateral lower extremity lymphedema no real significant or definitive open wounds but in general his lymphedema is way out of control. We were never able to refer him to lymphedema clinic simply due to the fact to be honest we were never able to get him completely healed. I do not see anyone with open wounds. The patient does have evidence of type 2 diabetes mellitus, lymphedema, chronic venous insufficiency, and hypertension. That really has not changed since his last evaluation. 02/09/2021 upon evaluation today patient appears to be doing a little better in regard to his legs although he still having a tremendous amount of drainage especially on the left leg. Fortunately there does not appear to be any evidence of active infection. Of note when we looked into this further it appears that the patient did not have any absorptive dressing on it was just the 4-layer compression wrap. Nonetheless this is probably big part of the issue here. 10/10; he comes in today with 3 large areas on the upper right lower leg likely remanence of denuded blistering under his compression wraps. He has no other wounds on the right. On the left he has the denuded area on the left medial foot and ankle and on the left dorsal foot. Massive lymphedema in both feet dorsally. Using Zetuvit under compression We have increased home health visitation to twice a week to change the dressings and will change it once 02/22/2021 upon evaluation today patient appears to be doing well currently  with regard to his wounds. He has been tolerating the dressing changes without complication. Fortunately there does not appear to be any evidence of active infection which is great news. No fevers, chills, nausea, vomiting, or diarrhea. The biggest issue I see currently is that home health is not putting any medicine on the  actual wounds before wrapping. 03/01/2021 upon evaluation today the patient's right leg actually appears to be doing quite well which is great news there does not appear to be any evidence of active infection at this time. No fevers, chills, nausea, vomiting, or diarrhea. With that being said the patient is having issues on the left foot where he is having significant drainage is also an ammonia smell he does not have any animals at home and this makes me concerned about a bacteria producing urea as a byproduct. Again the possible common organisms will be E. coli, Proteus, and Enterococcus. All 3 of which can be successfully treated with Levaquin. For that reason I think that this may be a good option for [place] to consider placing him on and I did obtain a culture as well for confirmation sake. 03/08/2021 upon evaluation today patient appears to be doing unfortunately still somewhat poorly in regard to his leg ulcerations. He actually has an area on the right leg where he blistered due to the fact that his wrap slid down and caused an area of pinching on his skin and this has led to a significant issue here. 03/15/2021 upon evaluation today patient unfortunately has not been wrapped appropriately with absorptive dressings nor with the appropriate technique for the third layer of the 4-layer compression wrap. These are issues that we continue to try to address with the home health nurse. Also the absorptive dressing that she had was cut in half and therefore that causes things to leak out it does not actually trap the fluid in regard to the top of the foot overall I think that all these combined are really not seeing things improved significantly here. Fortunately there does not appear to be any signs of significant infection at this time which is good news. He still is having a tremendous amount of drainage. 03/22/2021 upon evaluation today patient appears to be draining tremendously. He still continues  to tell me that he is using his pumps 2 times a day and that coupled with that tells me that he is elevating his legs as well. With that being said all things considered I am really just not seeing the improvement we would expect to see with the 4-layer compression wrap and all the above noted. He in fact had an extremely large Zetuvit dressing on both legs and that they were extremely filled to the max with fluid. This is after just being changed just before the weekend and this is Monday. Nonetheless I am concerned about the fact that there is something going on fairly significant that we cannot get any of this under control and that he is draining this significantly. He supposed be having an echocardiogram it sounds like scheduling has been an issue for him as far as getting in sooner. Its something to do with needing his cousin to drive him because of where it sat and he cannot drive himself to this appointment either way I really think he needs to try to see what he can do about making this happen a little sooner. He tells me he will call today. JUJHAR, EVERETT ([id number]) 03/29/2021 on evaluation today patient appears to be  doing about the same in regard to his legs. He did get his cardiology appointment moved up to 6 December which is at least good that is better than what it was before mid December. Overall very pleased in that regard. 04/05/2021 upon evaluation today patient unfortunately is still doing fairly poorly. There does not appear to be any signs of active infection at this time. No fevers, chills, nausea, vomiting, or diarrhea. Unfortunately I think until his edema is under control and overall fluid overload there is really not to be much chance that I can do much to get him better. This is quite unfortunate and frustrating both for myself and the patient to be perfectly honest. Nonetheless I think that he really needs to have a conversation both with his primary care provider as well  as cardiologist he sees the PCP on Monday and cardiology on Wednesday of next week. 04/13/2021 upon evaluation today patient appears to be doing poorly in regard to his bilateral lower extremities his left is still worse than the right. With that being said he has a tremendous amount of drainage he did see his primary care provider yesterday there really was not much there to be done from their perspective. He sees cardiology tomorrow. Nonetheless my biggest concern here is simply that if we do not get the edema under control he is going to continue to have drainage and honestly I think at some point he is going to become infected severely that is my main concern. 04/19/2021 upon evaluation today patient appears to be doing poorly still in regard to his legs. Unfortunately there does not appear to be any signs of infection at this point. He does have a tremendous amount of drainage however. We have not seen the results back from the cardiologist and the echocardiogram that was done. It appears that the patient checked out okay as far as that is concerned with regard to ejection fraction though we still have some issues here to be honest with his diastolic function. I am unsure if this is accounting for everything that we are seeing or not. Either way he has a tremendous amount of drainage from his legs that we are just not able to control in the outpatient setting at this point. I have reached out to Dr. Rockey Situ his cardiologist to see once he reviews the sheet if there is anything that he feels like can be done from an outpatient perspective if not then I think the way to go is probably can to be through inpatient admission and diuresis. Otherwise I am not sure how working to get this under control we tried antibiotics, compression wrapping, and I have told the patient to be elevating his legs I am not sure how much he does of this but either way I think that this is still an ongoing issue  nonetheless. 04/26/2021 upon evaluation today patient appears to be doing poorly in regard to his legs. He is having a tremendous amount of fluid at this point which is quite unfortunate. Its to the point that he may have had at least 5 to 10 pounds of fluid in his dressings this morning when they were removed these were changed this Friday. Subsequently I think he needs to go to the ER for further evaluation and treatment I think is probably can need diuresis possibly even IV antibiotics been on what the blood work looks like but in general I feel like he needs something to get this under control from  an outpatient perspective absent of everywhere I can think of and I cannot get this under control with our traditional measures. I think this is going require more so that we can get him better 12/30; this is a patient with severe bilateral lymphedema. He was hospitalized from 04/26/2021 through 04/29/2021 treated for cellulitis in the setting of lower extremity ulcers and lymphedema. After he left the hospital he is apparently seen for nurse visit our staff contacted cardiology and he has been started on Lasix 40 mg. Apparently his legs have less edema. Lab work from 05/04/2021 showed a BUN of 38 and creatinine of 1.59 these are elevated versus previous where his creatinine seems to have been 1.30 on 12/19 his potassium is 4.3. I believe the lab work is being followed by cardiology We have him in a 4-layer wrap. Xeroform on the leg wounds and sit to fit on the Berry damage skin on the left dorsal foot versus right dorsal foot. He has compression pumps but does not use them. We have apparently not yet ordered him compression stockings 05/17/2021 upon evaluation today patient's legs though better than last time I personally saw him appear to be getting worse compared to where they were previous. Dr. Quentin Cornwall was actually last 1 to see you I have not seen him since 19 December. That was before he went into  the ER. Coming out apparently his legs looked also and they still look better but not as good as they were in the past. 1/16; patient with severe bilateral lymphedema. Severe scaled hyperkeratotic skin on the dorsal aspect of his distal left foot and left medial ankle.. On the right side changes are not as bad. He did not have any weeping edema. Our intake nurse was convinced that he is being compliant with compression pumps 1 hour twice a day 05/31/2021 upon evaluation today patient actually appears to be doing a little bit better in my opinion in regard to his feet. I do not see as much drainage and it being just completely wet as it was previous. Fortunately I do not also see any signs of active infection which is great news as well. 06/07/21 Upon inspection patient's wound bed actually showed signs of doing well he is not nearly as weepy and wet as he has been in the past and overall very pleased in that regard. Fortunately I do not see any signs of active infection locally or nor systemically at this time. Which is great news. No fevers, chills, nausea, vomiting, or diarrhea. 06/14/2021 upon evaluation today patient appears to be doing well with regard to his right foot I am pleased in that regard. His left foot is still draining quite a bit despite using lymphedema pumps, 4-layer compression wraps, and he tells me elevating his legs as well. He also has Lasix that he takes twice a day. Nonetheless I believe that this is still good to be an ongoing issue. We have a hard time getting this under control as far as the swelling is concerned. 06/21/2021 upon evaluation today patient appears to be doing decently well in regard to his wounds all things considered. He still has a tremendous amount of drainage and fluid noted at this point. Fortunately I do not see any signs of active infection locally or systemically at this point which is great news. Nonetheless I am unsure where to go and how to do this  as far as trying to limit his swelling and weeping from his toes in particular.  06/28/2021 upon evaluation patient unfortunately continues to have significant drainage from his feet. We have been keeping him in a compression wrap and despite this he still continues to have extreme fluid issues he seen his cardiologist he is seeing the nephrologist. We really cannot find any way to get this under control when he did well was when he was in the hospital and they got some of the fluid away. But outside of that we are just struggling to achieve the long-term goal of getting this under control and keeping it under control unfortunately. 07/05/2021 upon evaluation today patient appears to be doing poorly in regard to his feet. Unfortunately this continues to be a significant issue and to be honest I am really not certain what to do about it. I referred him to Dr. Randol Kern at [facility] and he does have an appointment although it is 12 April. He also sees his primary care provider on 6 April. He did not want to see Dr. Haynes Kerns until after he saw his PCP that is the reason the appointment so far out. There is really not much I can do in that regard. Nonetheless I do think that we are still continue to have significant lymphedema issues with significant mount of weeping in regard to the feet and again this has just become extremely difficult to manage to be honest I am not sure if there is something else that Dr. Haynes Kerns or someone else could recommend he also will be seeing Dr. Dellia Nims in 2 weeks when I am on vacation and at that time I will see if Dr. Dellia Nims has any ideas about where to go from here in the meantime. TARRON, KROLAK ([id number]) 07/12/2021 upon evaluation today patient appears to be doing about as well as can be expected with regard to his feet. He does actually see his kidney doctor this Friday. He also will be seeing his primary care provider on April 4 and then following that around mid April he will be  seeing Dr. Haynes Kerns at [facility] which was a referral made for him. Again my goal is to try to find out some way to fix this and to be perfectly honest we have had some issues with making any good adjustments. When he was in the hospital and greater amounts of Lasix he was able to get this down and it looked much better upon discharge. With that being said right now things just are not doing nearly as good as what they used to be. 3/13; patient presents for follow-up. He has been using his lymphedema pumps over the past week. He reports an increase in his Lasix dose. He has no issues or complaints today. He denies signs of infection. 07/26/2021 upon evaluation today patient appears to be doing better in regard to his feet bilaterally. Both are showing signs of much less drainage which is great news and overall very pleased in that regard. Fortunately there does not appear to be any evidence of active infection locally or systemically at this time. No fevers, chills, nausea, vomiting, or diarrhea. 08/02/2021 upon evaluation today patient appears to be doing well with regard to his feet. Both are showing signs of being drier the right pretty much has not really draining much at all which is great news. The left is not draining anywhere close to his much as it was during the last evaluation. This is excellent news and overall very pleased. 08/09/2021 upon evaluation today patient appears to be doing well with regard to  his legs the right leg especially showing signs of excellent improvement which is great news I do not see any evidence of active infection locally or systemically which is great. In regard to the left leg he still has some weeping and drainage but nothing as significant as what it was in the past this is great news. 08-16-2021 upon evaluation today patient appears to actually be doing quite well in my opinion in regard to his feet. This is significantly improved compared to what we previously seen  and overall I am extremely pleased in that regard. I do believe that He is actually improving although this is obviously very slow going. 08-23-2021 upon evaluation today patient appears to be doing well currently in regard to his right leg which actually is pretty dry at this point today. Fortunately I do not see any evidence of active infection at this time which is great news. No fevers, chills, nausea, vomiting, or diarrhea. 08-30-2021 upon evaluation today patient appears to be doing well with regard to his lower extremities. The right foot is pretty much completely dry which is great news the left foot though not completely dry seems to be doing decently well. I do not see any signs of active infection locally or systemically which is great news. No fevers, chills, nausea, vomiting, or diarrhea. 09-06-2021 upon evaluation today patient appears to be doing well with regard to his feet in fact now the left foot is almost completely dry as well and I am definitely seeing a lot of significant improvement. With that being said unfortunately he actually appears to have some cellulitis of his right thigh. His toes are also little bit red but this may just be due to the increased swelling. He really is not warm to touch in regard to the toes. 5/8; excellent edema control on the right foot and lower leg there is no open wounds but we continue to put compression on this otherwise this will breakdown. He is using his compression pumps twice a day at home The area that is problematic is on the left dorsal foot some areas that are not fully epithelialized with very dry fissured skin over this area. 09-20-2021 upon evaluation today patient appears to be doing a little bit worse in regard to swelling at this time. Fortunately I do not see any signs of infection with that being said the wrap was not on quite as well as what I would like to have seen. I do believe that this has caused a little bit of excess swelling  and again we need to try to get this under good control. 09-27-2021 upon evaluation today patient appears to be doing awesome in regard to his feet and legs. Everything is measuring smaller the swelling is down and to be perfectly honest I am extremely pleased with the end of his feet especially on the left side and how dry this is today. I do think we are on the right track here. 5/31; patient presents for follow-up. He is using nystatin powder to the feet bilaterally under 4-layer compression. He has no issues or complaints today. He states he is going to the lymphedema clinic tomorrow. Since he has no open wounds on the right lower extremity they will be focused on the side. 10-11-2021 upon evaluation today patient appears to be doing excellent in regard to his legs. He has been tolerating the dressing changes without complication. Fortunately there does not appear to be any evidence of active infection locally or  systemically at this time which is great news. No fevers, chills, nausea, vomiting, or diarrhea. 10-18-2021 upon evaluation today patient appears to be doing well with regard to his legs. He has been tolerating the dressing changes without complication and actually seen in lymphedema clinic now in regard to his right leg were taken care of the left leg. Fortunately I do not see any evidence of active infection locally or systemically at this time. 10-26-2021 upon evaluation today patient's wounds actually are showing signs of doing well in fact he really does not have wounds as much is weeping of the lower extremities. Fortunately I do not see any evidence of active infection locally or systemically which is great news. No fever or chills noted 11-02-2021 upon evaluation today patient appears to be doing decently well in regard to his legs. Of actually been on the phone quite a bit discussing with [facility] orthotics custom compression for him. I also have been discussing everything with his  lymphedema clinic provider as well. Helene Kelp notes that there is really not much more that she can do for him which was noted last week as well. Subsequently I do believe that the patient would benefit from getting custom compression in fact I think that is what he is going require to keep anything under control here. He voiced understanding. Electronic Signature(s) Signed: 11/02/2021 1:05:52 PM By: Worthy Keeler PA-C Entered By: Worthy Keeler on 11/02/2021 13:05:52 [name] ([id number]) -------------------------------------------------------------------------------- Physical Exam Details Patient Name: KOEHN, [name] Date of Service: 11/02/2021 11:00 AM Medical Record Number: [id number] Patient Account Number: [id number] Date of Birth/Sex: 1951/02/04 (71 y.o. M) Treating RN: [name] Primary Care Provider: Fontaine No Other Clinician: Referring Provider: Fontaine No Treating Provider/Extender: Skipper Cliche in Treatment: 9 Constitutional Obese and well-hydrated in no acute distress. Respiratory normal breathing without difficulty. Psychiatric this patient is able to make decisions and demonstrates good insight into disease process. Alert and Oriented x 3. pleasant and cooperative. Notes Upon inspection patient's legs actually are showing signs of been a little bit more wet last time he was here there was quite a bit of lotion put on them I think that is part of the issue here. Otherwise we will try to get that under control at this point. Electronic Signature(s) Signed: 11/02/2021 1:06:08 PM By: Worthy Keeler PA-C Entered By: Worthy Keeler on 11/02/2021 13:06:07 [name] ([id number]) -------------------------------------------------------------------------------- Physician Orders Details Patient Name: [name] Date of Service: 11/02/2021 11:00 AM Medical Record Number: [id number] Patient Account Number: [id number] Date of Birth/Sex: [birth year]  (71 y.o. M) Treating RN: [name] Primary Care Provider: Fontaine No Other Clinician: Referring Provider: Fontaine No Treating Provider/Extender: Skipper Cliche in Treatment: 30 Verbal / Phone Orders: No Diagnosis Coding ICD-10 Coding Code Description E11.622 Type 2 diabetes mellitus with other skin ulcer I89.0 Lymphedema, not elsewhere classified I87.2 Venous insufficiency (chronic) (peripheral) L97.822 Non-pressure chronic ulcer of other part of left lower leg with fat layer exposed L97.512 Non-pressure chronic ulcer of other part of right foot with fat layer exposed I10 Essential (primary) hypertension L97.811 Non-pressure chronic ulcer of other part of right lower leg limited to breakdown of skin Follow-up Appointments o Return Appointment in 1 week. o Nurse Visit as needed o Other: Bathing/ Shower/ Hygiene o May shower with wound dressing protected with water repellent cover or cast protector. o No tub bath. Edema Control - Lymphedema / Segmental Compressive Device / Other Bilateral Lower Extremities o Optional: One layer of unna paste  to top of compression wrap (to act as an anchor). - Unna paste on calf to secure wrap in place as needed o 4 Layer Compression System Lymphedema. - bi lat, nystatin powder between toes , 4x4 gauze between toes , ABD to cover toes; zetuvit over top of foot, charcoal pad , tubi grip C to foot and ankle double layer COTTON LAYER SPIRAL, LIGHT TAN SPIRAL. WHITE WITH YELLOW LINE FIGURE 8 , COBAN SPIRAL o Tubigrip double layer applied - double size C foot ankles o Elevate, Exercise Daily and Avoid Standing for Long Periods of Time. o Elevate legs to the level of the heart and pump ankles as often as possible o Elevate leg(s) parallel to the floor when sitting. o Compression Pump: Use compression pump on left lower extremity for 60 minutes, twice daily. - 2 times per day o DO YOUR BEST to sleep in the  bed at night. DO NOT sleep in your recliner. Long hours of sitting in a recliner leads to swelling of the legs and/or potential wounds on your backside. o Other: - Contact prescriber regarding use of diuretics to reduce fluid overload. Off-Loading o Turn and reposition every 2 hours Additional Orders / Instructions o Follow Nutritious Diet and Increase Protein Intake Electronic Signature(s) Signed: 11/03/2021 8:01:30 AM By: [name] RN Signed: 11/05/2021 4:59:17 PM By: Worthy Keeler PA-C Entered By: [name] on 11/02/2021 13:24:15 [name] ([id number]) -------------------------------------------------------------------------------- Problem List Details Patient Name: [name] Date of Service: 11/02/2021 11:00 AM Medical Record Number: [id number] Patient Account Number: [id number] Date of Birth/Sex: 09/17/1950 (71 y.o. M) Treating RN: [name] Primary Care Provider: Fontaine No Other Clinician: Referring Provider: Fontaine No Treating Provider/Extender: Skipper Cliche in Treatment: 1 Active Problems ICD-10 Encounter Code Description Active Date MDM Diagnosis E11.622 Type 2 diabetes mellitus with other skin ulcer 01/26/2021 No Yes I89.0 Lymphedema, not elsewhere classified 01/26/2021 No Yes I87.2 Venous insufficiency (chronic) (peripheral) 01/26/2021 No Yes L97.822 Non-pressure chronic ulcer of other part of left lower leg with fat layer 01/26/2021 No Yes exposed L97.512 Non-pressure chronic ulcer of other part of right foot with fat layer 01/26/2021 No Yes exposed I10 Essential (primary) hypertension 01/26/2021 No Yes L97.811 Non-pressure chronic ulcer of other part of right lower leg limited to 02/15/2021 No Yes breakdown of skin Inactive Problems Resolved Problems Electronic Signature(s) Signed: 11/02/2021 10:57:00 AM By: Worthy Keeler PA-C Entered By: Worthy Keeler on 11/02/2021 10:57:00 [name]  ([id number]) -------------------------------------------------------------------------------- Progress Note Details Patient Name: [name] Date of Service: 11/02/2021 11:00 AM Medical Record Number: [id number] Patient Account Number: [id number] Date of Birth/Sex: [birth year] (71 y.o. M) Treating RN: [name] Primary Care Provider: Fontaine No Other Clinician: Referring Provider: Fontaine No Treating Provider/Extender: Skipper Cliche in Treatment: 40 Subjective Chief Complaint Information obtained from Patient Left LE ulcers History of Present Illness (HPI) 71 year old male who presented to the ER with bilateral lower extremity blisters which had started last week. he has a past medical history of leukemia, diabetes mellitus, hypertension, edema of both lower extremities, his recurrent skin infections, peripheral vascular disease, coronary artery disease, congestive heart failure and peripheral neuropathy. in the ER he was given Rocephin and put on Silvadene cream. he was put on oral doxycycline and was asked to follow-up with the [facility]. His last hemoglobin A1c was 6.6 in December and he checks his blood sugar once a week. He does not have any physicians outside the [place] system. He does not recall any vascular duplex studies  done either for arterial or venous disease but was told to wear compression stockings which he does not use 05/30/2016 -- we have not yet received any of his notes from the [facility] hospital system and his arterial and venous duplex studies are scheduled here in [place] around mid February. We are unable to have his insurance accepted by home health agencies and hence he is getting dressings only once a week. 06/06/16 -- -- I received a call from the patient's PCP at the [facility] at [facility] and spoke to Dr. Garvin Fila, phone number [phone] and fax number [phone]. She confirmed that no vascular testing was done over the last 5 years and  she would be happy to do them if the patient did want them to be done at the [place] and we could fax him a request. Readmission: 71 year old male seen by as in February of this year and was referred to vein and vascular for studies and opinion from the vascular surgeons. The patient returns today with a fresh problem having had blisters on his left lower extremity which have been there for about 5 days and he clearly states that he has been wearing his compression stockings as advised though he could not read the moderate compression and has been wearing light compression. Review of his electronic medical records note that he had lower extremity arterial duplex examination done on 06/23/2016 which showed no hemodynamically significant stenosis in the bilateral lower extremity arterial system. He also had a lower extremity venous reflux examination done on 07/07/2016 and it was noted that he had venous incompetence in the right great saphenous vein and bilateral common femoral veins. Patient was seen by Dr. Tamala Julian on the same day and for some reason his notes do not reflect the venous studies or the arterial studies and he recommended patient do a venous duplex ultrasound to look for reflux and return to see him.he would also consider a lymph pump if required. The patient was told that his workup was normal and hence the patient canceled his follow-up appointment. 02/03/17 on evaluation today patient left medial lower extremity blister appears to be doing about the same. It is still continuing to drain and there's still the blistered skin covering the wound bed which is making it difficult for the alternate to do its job. Fortunately there is no evidence of cellulitis. No fevers chills noted. Patient states in general he is not having any significant discomfort. Patient's lower extremity arterial duplex exam revealed that patient was hemodynamically stable with no evidence of stenosis in regard to  the bilateral lower extremities. The lower extremity venous reflux exam revealed the patient had venous incontinence noted in the right greater saphenous and bilateral common femoral vein. There is no evidence of deep or superficial vein thrombosis in the bilateral lower extremities. Readmission: 11/12/18 Patient presents for evaluation our clinic today concerning issues that he is having with his left lower extremity. He tells me that a couple weeks ago he began developing blisters on the left lower extremity along with increased swelling. He typically wears his compression stockings on a regular basis is previously been evaluated both here as well is with vascular surgery they would recommend lymphedema pumps but unfortunately that somehow fell through and he never heard anything back from that. Nonetheless I think lymphedema pumps would be beneficial for this patient. He does have a history of hypertension and diabetes. Obviously the chronic venous stasis and lymphedema as well. At this point the blisters have been  given in more trouble he states sometimes when the blisters openings able to clean it down with alcohol and it will dry out and do well. Unfortunately that has not been the case this time. He is having some discomfort although this mean these with cleaning the areas he doesn't have discomfort just on a regular basis. He has not been able to wear his compression stockings since the blisters arose due to the fact that of course it will drain into the socks causing additional issues and he didn't have any way to wrap this otherwise. He has increased to taking his Lasix every day instead of every other day. He sees his primary care provider later this month as well. No fevers, chills, nausea, or vomiting noted at this time. 11/19/18-Patient returns at 1 week, per intake RN the amount of seepage into the compression wraps was definitely improved, overall all the wounds are measuring smaller  but continuing silver alginate to the wounds as primary dressing 11/26/18 on evaluation today patient appears to be doing quite well in regard to his left lower [place] ulcers. In fact of the areas that were noted initially he only has two regions still open. There is no evidence of active infection at this time. He still is not heard anything from the company regarding lymphedema pumps as of yet. Again as previously seen vascular they have not recommended any surgical intervention. URIJAH, ARKO ([id number]) 12/03/2018 on evaluation today patient actually appears to be doing quite well with regard to his lower extremity ulcers. In fact most of the areas appear to be healed the one spot which does not seem to be completely healed I am unsure of whether or not this is really draining that much but nonetheless there does not appear to be any signs of infection or significant drainage at this point. There is no sign of fever, chills, nausea, vomiting, or diarrhea. Overall I am pleased with how things have progressed I think is very close to being able to transition to his home compression stockings. 12/10/2018 upon evaluation today patient appears to be doing quite well with regard to his left lower extremity. He has been tolerating the dressing changes without complication. Fortunately there is no signs of active infection at this time. He appears after thorough evaluation of his leg to only have 1 small area that remains open at this point everything else appears to be almost completely closed. He still have significant swelling of the left lower extremity. We had discussed discussing this with his primary care provider he is not able to see her in person they were at the [facility] and right now the [place] is not seeing patients on site. According to the patient anyway. Subsequently he did speak with her apparently and his primary care provider feels that he may likely have a DVT. With that being said she has  not seen his leg she is just going off of his history. Nonetheless that is a concern that the patient now has as well and while I do not feel the DVT is likely we can definitely ensure that that is not the case I will go ahead and see about putting that order in today. Nonetheless otherwise I am in a recommend that we continue with the current wound care measures including the compression therapy most likely. We just need to ensure that his leg is indeed free of any DVTs. 12/17/2018 on evaluation today patient actually appears to be completely healed today. He does  have 2 very small areas of blistering although this is not anything too significant at this point which is good news. With that being said I am in agreement with the fact that I think he is completely healed at this point. He does want to get back into his compression stocking. The good news is we have gotten approval from insurance for his lymphedema pumps we received a letter since last saw him last week. The other good news is his study did come back and showed no evidence of a DVT. 12/20/2018 on evaluation today patient presents for follow-up concerning his ongoing issues with his left lower extremity. He was actually discharged last Friday and did fairly well until he states blisters opened this morning. He tells me he has been wearing his compression stocking although he has a hard time getting this on. There does not appear to be any signs of active infection at this time. No fevers, chills, nausea, vomiting, or diarrhea. 12/27/2018 on evaluation today patient appears to be doing very well with regard to his swelling of the left lower extremity the 4 layer compression wrap seems to have been beneficial for him. Fortunately there is no signs of active infection at this time. Patient has been tolerating the compression wrap without complication and his foot swelling in particular appears to be greatly improved. He does still have a wound  on the lateral portion of his left leg I believe this is more of a blister that has now reopened. 01/03/2019 on evaluation today patient actually appears to be doing excellent in regard to his left lower extremity. He did receive his compression pumps and is actually use this 7 times since he was last here in the office. On top of the compression wrap he is now roughly 3 cm better at the calf and 2 cm better at the ankle he also states that his foot seem to go an issue better without even having to use a shoe horn. Obviously I think this is all evidence that he is doing excellent in this regard. The other good news is he does not appear to have anything open today as far as wounds are concerned. 01/15/2019 on evaluation today patient appears to be doing more poorly yet again with regard to his left lower extremity. He has developed new wounds again after being discharged just recently. Unfortunately this continues to be the case that he will heal and then have subsequent new wounds. The last time I was hopeful that he may not end up coming back too quickly especially since he states he has been using his lymphedema pumps along with wearing his compression. Nonetheless he had a blister on the back of his leg that popped up on the left and this has opened up into an ulceration it is quite painful. 01/22/19 on evaluation today patient actually appears to be doing well with regard to his wound on the left lower extremity. He's been tolerating the dressing changes without complication including the compression wrap in the wound appears to be significantly smaller today which is great news. Overall very pleased in this regard. 01/29/2019 on evaluation today patient appears to be doing well with regard to his left posterior lower extremity ulcer. He has been tolerating the dressing changes without complication. This is not completely healed but is getting much closer. We did order a Farrow wrap 4000 for him he  has received this and has it with him today although I am not sure we  are quite ready to start him on that as of yet. We are very close. 02/05/2019 on evaluation today patient actually appears to be doing quite well with regard to his left posterior lower extremity ulcer. He still has a very tiny opening remaining but the fortunate thing is he seems to be healing quite nicely. He also did get his Farrow wrap which I am hoping will help with his edema control as well at home. Fortunately there is no evidence of active infection. 02/12/2019 patient and fortunately appears to be doing poorly in regard to his wounds of the left lower extremity. He was very close to healing therefore we attempted to use his Velcro compression wraps continuing with lymphedema pumps at home. Unfortunately that does not seem to have done very well for him. He tells me that he wore them all the time but again I am not sure why if that is the case that he is having such significant edema. He is still on his fluid pills as well. With that being said there is no obvious sign of infection although I do wonder about the possibility of infection at this time as well. 02/19/2019 unfortunately upon evaluation today patient appears to be doing more poorly with regard to his left lower extremity. He is not showing signs of significant improvement and I think the biggest issue here is that he does have an infection that appears to likely be Pseudomonas. That is based on the blue-green drainage that were noted at this time. Unfortunately the antibiotic that has been on is not going to take care of this at all. I think they will get a need to switch him to either Levaquin or Cipro and this was discussed with the patient. 02/26/2019 on evaluation today patient's lower extremity on the left appears to be doing significantly better as compared to last evaluation. Fortunately there is no signs of active infection at this time. He has been  tolerating the compression wrap without complication in fact he made it the whole week at this point. He is showing signs of excellent improvement I am very happy in this regard. With that being said he is having some issues with infection we did review the results of his culture which I noted today. He did have a positive finding for Enterobacter as well as Alcaligenes faecalis. Fortunately the Levaquin that I placed him on will work for both which is great news. There is no signs of systemic infection at this point. 10/30; left posterior leg wound in the setting of very significant edema and what looks like chronic venous inflammation. He has compression pumps but does not use them. We have been using 3 layer compression. Silver alginate to the wound as the primary dressing 03/18/2019 on evaluation today patient appears to be doing a little better compared to last time I saw him. He really has not been using his compression pumps he tells me that he is having too much discomfort. He has been keeping his wraps on however. He is only been taking his fluid pills every other day because he states they are not really helping and he has an appointment with his primary care provider at the [facility] tomorrow. Subsequently the wound itself on the left lower extremity does seem to be greatly improved compared to previous. DARCEY, DEMMA ([id number]) 03/25/2019 on evaluation today patient appears to be doing better with regard to his wounds on the bilateral lower extremities. The left is doing excellent the right is  also doing better although both still do show some signs of open wounds noted at this point unfortunately. Fortunately there is no signs of active infection at this time. The patient also is not really having any significant pain which is good news. Unfortunately there was some confusion with the referral on vascular disease and as far as getting the patient scheduled there can be contacting him later today  to do this fortunately we got this straightened out. 04/01/2019 on evaluation today patient appears to be doing no fevers, chills, nausea, vomiting, or diarrhea. Excellent at this time with regard to his lower extremities. There does not appear to be any open wound at this point which is good news. Fortunately is also no signs of active infection at this time. Overall feel like the patient has done excellent with the compression the problem is every time we got him to this point and then subsequently go to using his own compression things just go right back to where they were. I am not sure how to address this we can try to get an appointment with vascular for 2 weeks now they have yet to call him. Obviously this has become frustrating for the patient as well. I think the issue has just been an honest error as far as scheduling is concerned but nonetheless still worn out the point where I am unsure of which direction we should take. 04/08/2019 on evaluation today patient actually appears to be doing well with regard to his lower extremities. There are no open wounds at this time and things seem to be managing quite nicely as far as the overall edema control is concerned. With that being said he does have his compression socks today for [place] to go ahead and reinitiate therapy in that manner at this point. He is going to be going for shoes to be measured on Wednesday and then coincidentally he will also be seeing vascular on Thursday. Overall I think this is good news and again I am hopeful that they will be able to do something for him to help prevent ongoing issues with edema control as well. No fevers, chills, nausea, vomiting, or diarrhea. 04/11/2019 on evaluation today patient actually appears to be doing poorly after just being discharged on Monday of this week. He had been experiencing issues with again blisters especially on the left lower extremity. With that being said he was completely healed  and appeared to be doing great this past Monday. He then subsequently has new blisters that formed before his appointment with vascular this morning. He was also measured for shoes in the interim. With that being said we may have figured out what exactly is going on and why he continues to have issues like what we are seeing at this point. He takes his compression stockings off at nighttime and then he ends up having to sleep in his chair for 5-6 hours a night. He sleeps with his feet down he cannot really get him up in the recliner and therefore he is sleeping and the worst possible his position with his feet on the floor for that majority of the time. Again as I explained to him that is about one third at minimum at least one fourth of his day that he spending with his feet dangling down on the ground and the worst possible position they could be. I think this may be what is causing the issue. Subsequently I am leaning toward thinking that he may need a hospital  bed in order to elevate his legs. We likely can have to coordinate this with his primary care provider at the [facility]. Readmission: 01/26/2021 this is a patient who presents for repeat evaluation here in the clinic although it is actually been couple of years since have seen him in fact it was December 2020 when I last saw him. Subsequently he never really healed but did end up being lost to follow-up. He tells me has been having issues ongoing with his lower extremities has bilateral lower extremity lymphedema no real significant or definitive open wounds but in general his lymphedema is way out of control. We were never able to refer him to lymphedema clinic simply due to the fact to be honest we were never able to get him completely healed. I do not see anyone with open wounds. The patient does have evidence of type 2 diabetes mellitus, lymphedema, chronic venous insufficiency, and hypertension. That really has not changed since his  last evaluation. 02/09/2021 upon evaluation today patient appears to be doing a little better in regard to his legs although he still having a tremendous amount of drainage especially on the left leg. Fortunately there does not appear to be any evidence of active infection. Of note when we looked into this further it appears that the patient did not have any absorptive dressing on it was just the 4-layer compression wrap. Nonetheless this is probably big part of the issue here. 10/10; he comes in today with 3 large areas on the upper right lower leg likely remanence of denuded blistering under his compression wraps. He has no other wounds on the right. On the left he has the denuded area on the left medial foot and ankle and on the left dorsal foot. Massive lymphedema in both feet dorsally. Using Zetuvit under compression We have increased home health visitation to twice a week to change the dressings and will change it once 02/22/2021 upon evaluation today patient appears to be doing well currently with regard to his wounds. He has been tolerating the dressing changes without complication. Fortunately there does not appear to be any evidence of active infection which is great news. No fevers, chills, nausea, vomiting, or diarrhea. The biggest issue I see currently is that home health is not putting any medicine on the actual wounds before wrapping. 03/01/2021 upon evaluation today the patient's right leg actually appears to be doing quite well which is great news there does not appear to be any evidence of active infection at this time. No fevers, chills, nausea, vomiting, or diarrhea. With that being said the patient is having issues on the left foot where he is having significant drainage is also an ammonia smell he does not have any animals at home and this makes me concerned about a bacteria producing urea as a byproduct. Again the possible common organisms will be E. coli, Proteus, and  Enterococcus. All 3 of which can be successfully treated with Levaquin. For that reason I think that this may be a good option for [place] to consider placing him on and I did obtain a culture as well for confirmation sake. 03/08/2021 upon evaluation today patient appears to be doing unfortunately still somewhat poorly in regard to his leg ulcerations. He actually has an area on the right leg where he blistered due to the fact that his wrap slid down and caused an area of pinching on his skin and this has led to a significant issue here. 03/15/2021 upon evaluation today  patient unfortunately has not been wrapped appropriately with absorptive dressings nor with the appropriate technique for the third layer of the 4-layer compression wrap. These are issues that we continue to try to address with the home health nurse. Also the absorptive dressing that she had was cut in half and therefore that causes things to leak out it does not actually trap the fluid in regard to the top of the foot overall I think that all these combined are really not seeing things improved significantly here. Fortunately there does not appear to be any signs of significant infection at this time which is good news. He still is having a tremendous amount of drainage. 03/22/2021 upon evaluation today patient appears to be draining tremendously. He still continues to tell me that he is using his pumps 2 times a day and that coupled with that tells me that he is elevating his legs as well. With that being said all things considered I am really just not seeing the improvement we would expect to see with the 4-layer compression wrap and all the above noted. He in fact had an extremely large Zetuvit dressing on both legs and that they were extremely filled to the max with fluid. This is after just being changed just before the weekend and this TERRON, MERFELD ([id number]) is Monday. Nonetheless I am concerned about the fact that there is  something going on fairly significant that we cannot get any of this under control and that he is draining this significantly. He supposed be having an echocardiogram it sounds like scheduling has been an issue for him as far as getting in sooner. Its something to do with needing his cousin to drive him because of where it sat and he cannot drive himself to this appointment either way I really think he needs to try to see what he can do about making this happen a little sooner. He tells me he will call today. 03/29/2021 on evaluation today patient appears to be doing about the same in regard to his legs. He did get his cardiology appointment moved up to 6 December which is at least good that is better than what it was before mid December. Overall very pleased in that regard. 04/05/2021 upon evaluation today patient unfortunately is still doing fairly poorly. There does not appear to be any signs of active infection at this time. No fevers, chills, nausea, vomiting, or diarrhea. Unfortunately I think until his edema is under control and overall fluid overload there is really not to be much chance that I can do much to get him better. This is quite unfortunate and frustrating both for myself and the patient to be perfectly honest. Nonetheless I think that he really needs to have a conversation both with his primary care provider as well as cardiologist he sees the PCP on Monday and cardiology on Wednesday of next week. 04/13/2021 upon evaluation today patient appears to be doing poorly in regard to his bilateral lower extremities his left is still worse than the right. With that being said he has a tremendous amount of drainage he did see his primary care provider yesterday there really was not much there to be done from their perspective. He sees cardiology tomorrow. Nonetheless my biggest concern here is simply that if we do not get the edema under control he is going to continue to have drainage and  honestly I think at some point he is going to become infected severely that is my main concern.  04/19/2021 upon evaluation today patient appears to be doing poorly still in regard to his legs. Unfortunately there does not appear to be any signs of infection at this point. He does have a tremendous amount of drainage however. We have not seen the results back from the cardiologist and the echocardiogram that was done. It appears that the patient checked out okay as far as that is concerned with regard to ejection fraction though we still have some issues here to be honest with his diastolic function. I am unsure if this is accounting for everything that we are seeing or not. Either way he has a tremendous amount of drainage from his legs that we are just not able to control in the outpatient setting at this point. I have reached out to Dr. Rockey Situ his cardiologist to see once he reviews the sheet if there is anything that he feels like can be done from an outpatient perspective if not then I think the way to go is probably can to be through inpatient admission and diuresis. Otherwise I am not sure how working to get this under control we tried antibiotics, compression wrapping, and I have told the patient to be elevating his legs I am not sure how much he does of this but either way I think that this is still an ongoing issue nonetheless. 04/26/2021 upon evaluation today patient appears to be doing poorly in regard to his legs. He is having a tremendous amount of fluid at this point which is quite unfortunate. Its to the point that he may have had at least 5 to 10 pounds of fluid in his dressings this morning when they were removed these were changed this Friday. Subsequently I think he needs to go to the ER for further evaluation and treatment I think is probably can need diuresis possibly even IV antibiotics been on what the blood work looks like but in general I feel like he needs something to  get this under control from an outpatient perspective absent of everywhere I can think of and I cannot get this under control with our traditional measures. I think this is going require more so that we can get him better 12/30; this is a patient with severe bilateral lymphedema. He was hospitalized from 04/26/2021 through 04/29/2021 treated for cellulitis in the setting of lower extremity ulcers and lymphedema. After he left the hospital he is apparently seen for nurse visit our staff contacted cardiology and he has been started on Lasix 40 mg. Apparently his legs have less edema. Lab work from 05/04/2021 showed a BUN of 38 and creatinine of 1.59 these are elevated versus previous where his creatinine seems to have been 1.30 on 12/19 his potassium is 4.3. I believe the lab work is being followed by cardiology We have him in a 4-layer wrap. Xeroform on the leg wounds and sit to fit on the Berry damage skin on the left dorsal foot versus right dorsal foot. He has compression pumps but does not use them. We have apparently not yet ordered him compression stockings 05/17/2021 upon evaluation today patient's legs though better than last time I personally saw him appear to be getting worse compared to where they were previous. Dr. Quentin Cornwall was actually last 1 to see you I have not seen him since 19 December. That was before he went into the ER. Coming out apparently his legs looked also and they still look better but not as good as they were in the past. 1/16;  patient with severe bilateral lymphedema. Severe scaled hyperkeratotic skin on the dorsal aspect of his distal left foot and left medial ankle.. On the right side changes are not as bad. He did not have any weeping edema. Our intake nurse was convinced that he is being compliant with compression pumps 1 hour twice a day 05/31/2021 upon evaluation today patient actually appears to be doing a little bit better in my opinion in regard to his feet. I do  not see as much drainage and it being just completely wet as it was previous. Fortunately I do not also see any signs of active infection which is great news as well. 06/07/21 Upon inspection patient's wound bed actually showed signs of doing well he is not nearly as weepy and wet as he has been in the past and overall very pleased in that regard. Fortunately I do not see any signs of active infection locally or nor systemically at this time. Which is great news. No fevers, chills, nausea, vomiting, or diarrhea. 06/14/2021 upon evaluation today patient appears to be doing well with regard to his right foot I am pleased in that regard. His left foot is still draining quite a bit despite using lymphedema pumps, 4-layer compression wraps, and he tells me elevating his legs as well. He also has Lasix that he takes twice a day. Nonetheless I believe that this is still good to be an ongoing issue. We have a hard time getting this under control as far as the swelling is concerned. 06/21/2021 upon evaluation today patient appears to be doing decently well in regard to his wounds all things considered. He still has a tremendous amount of drainage and fluid noted at this point. Fortunately I do not see any signs of active infection locally or systemically at this point which is great news. Nonetheless I am unsure where to go and how to do this as far as trying to limit his swelling and weeping from his toes in particular. 06/28/2021 upon evaluation patient unfortunately continues to have significant drainage from his feet. We have been keeping him in a compression wrap and despite this he still continues to have extreme fluid issues he seen his cardiologist he is seeing the nephrologist. We really cannot find any way to get this under control when he did well was when he was in the hospital and they got some of the fluid away. But outside of that we are just struggling to achieve the long-term goal of getting this  under control and keeping it under control unfortunately. 07/05/2021 upon evaluation today patient appears to be doing poorly in regard to his feet. Unfortunately this continues to be a significant issue and to be honest I am really not certain what to do about it. I referred him to Dr. Randol Kern at [facility] and he does have an appointment although it is ALEK, BORGES ([id number]) 12 April. He also sees his primary care provider on 6 April. He did not want to see Dr. Haynes Kerns until after he saw his PCP that is the reason the appointment so far out. There is really not much I can do in that regard. Nonetheless I do think that we are still continue to have significant lymphedema issues with significant mount of weeping in regard to the feet and again this has just become extremely difficult to manage to be honest I am not sure if there is something else that Dr. Haynes Kerns or someone else could recommend he also will be seeing  Dr. Dellia Nims in 2 weeks when I am on vacation and at that time I will see if Dr. Dellia Nims has any ideas about where to go from here in the meantime. 07/12/2021 upon evaluation today patient appears to be doing about as well as can be expected with regard to his feet. He does actually see his kidney doctor this Friday. He also will be seeing his primary care provider on April 4 and then following that around mid April he will be seeing Dr. Haynes Kerns at [facility] which was a referral made for him. Again my goal is to try to find out some way to fix this and to be perfectly honest we have had some issues with making any good adjustments. When he was in the hospital and greater amounts of Lasix he was able to get this down and it looked much better upon discharge. With that being said right now things just are not doing nearly as good as what they used to be. 3/13; patient presents for follow-up. He has been using his lymphedema pumps over the past week. He reports an increase in his Lasix dose. He has no  issues or complaints today. He denies signs of infection. 07/26/2021 upon evaluation today patient appears to be doing better in regard to his feet bilaterally. Both are showing signs of much less drainage which is great news and overall very pleased in that regard. Fortunately there does not appear to be any evidence of active infection locally or systemically at this time. No fevers, chills, nausea, vomiting, or diarrhea. 08/02/2021 upon evaluation today patient appears to be doing well with regard to his feet. Both are showing signs of being drier the right pretty much has not really draining much at all which is great news. The left is not draining anywhere close to his much as it was during the last evaluation. This is excellent news and overall very pleased. 08/09/2021 upon evaluation today patient appears to be doing well with regard to his legs the right leg especially showing signs of excellent improvement which is great news I do not see any evidence of active infection locally or systemically which is great. In regard to the left leg he still has some weeping and drainage but nothing as significant as what it was in the past this is great news. 08-16-2021 upon evaluation today patient appears to actually be doing quite well in my opinion in regard to his feet. This is significantly improved compared to what we previously seen and overall I am extremely pleased in that regard. I do believe that He is actually improving although this is obviously very slow going. 08-23-2021 upon evaluation today patient appears to be doing well currently in regard to his right leg which actually is pretty dry at this point today. Fortunately I do not see any evidence of active infection at this time which is great news. No fevers, chills, nausea, vomiting, or diarrhea. 08-30-2021 upon evaluation today patient appears to be doing well with regard to his lower extremities. The right foot is pretty much completely dry  which is great news the left foot though not completely dry seems to be doing decently well. I do not see any signs of active infection locally or systemically which is great news. No fevers, chills, nausea, vomiting, or diarrhea. 09-06-2021 upon evaluation today patient appears to be doing well with regard to his feet in fact now the left foot is almost completely dry as well and I am definitely  seeing a lot of significant improvement. With that being said unfortunately he actually appears to have some cellulitis of his right thigh. His toes are also little bit red but this may just be due to the increased swelling. He really is not warm to touch in regard to the toes. 5/8; excellent edema control on the right foot and lower leg there is no open wounds but we continue to put compression on this otherwise this will breakdown. He is using his compression pumps twice a day at home The area that is problematic is on the left dorsal foot some areas that are not fully epithelialized with very dry fissured skin over this area. 09-20-2021 upon evaluation today patient appears to be doing a little bit worse in regard to swelling at this time. Fortunately I do not see any signs of infection with that being said the wrap was not on quite as well as what I would like to have seen. I do believe that this has caused a little bit of excess swelling and again we need to try to get this under good control. 09-27-2021 upon evaluation today patient appears to be doing awesome in regard to his feet and legs. Everything is measuring smaller the swelling is down and to be perfectly honest I am extremely pleased with the end of his feet especially on the left side and how dry this is today. I do think we are on the right track here. 5/31; patient presents for follow-up. He is using nystatin powder to the feet bilaterally under 4-layer compression. He has no issues or complaints today. He states he is going to the lymphedema  clinic tomorrow. Since he has no open wounds on the right lower extremity they will be focused on the side. 10-11-2021 upon evaluation today patient appears to be doing excellent in regard to his legs. He has been tolerating the dressing changes without complication. Fortunately there does not appear to be any evidence of active infection locally or systemically at this time which is great news. No fevers, chills, nausea, vomiting, or diarrhea. 10-18-2021 upon evaluation today patient appears to be doing well with regard to his legs. He has been tolerating the dressing changes without complication and actually seen in lymphedema clinic now in regard to his right leg were taken care of the left leg. Fortunately I do not see any evidence of active infection locally or systemically at this time. 10-26-2021 upon evaluation today patient's wounds actually are showing signs of doing well in fact he really does not have wounds as much is weeping of the lower extremities. Fortunately I do not see any evidence of active infection locally or systemically which is great news. No fever or chills noted 11-02-2021 upon evaluation today patient appears to be doing decently well in regard to his legs. Of actually been on the phone quite a bit discussing with [facility] orthotics custom compression for him. I also have been discussing everything with his lymphedema clinic provider as well. Helene Kelp notes that there is really not much more that she can do for him which was noted last week as well. Subsequently I do believe that the patient would benefit from getting custom compression in fact I think that is what he is going require to keep anything under control here. He voiced understanding. Dumfries, [name] ([id number]) Objective Constitutional Obese and well-hydrated in no acute distress. Vitals Time Taken: 11:27 AM, Weight: 312 lbs, Temperature: 98.2 F, Pulse: 68 bpm, Respiratory Rate: 18 breaths/min, Blood  Pressure:  151/68 mmHg. Respiratory normal breathing without difficulty. Psychiatric this patient is able to make decisions and demonstrates good insight into disease process. Alert and Oriented x 3. pleasant and cooperative. General Notes: Upon inspection patient's legs actually are showing signs of been a little bit more wet last time he was here there was quite a bit of lotion put on them I think that is part of the issue here. Otherwise we will try to get that under control at this point. 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 Nurse Visit as needed Other: Bathing/ Shower/ Hygiene: May shower with wound dressing protected with water repellent cover or cast protector. No tub bath. Edema Control - Lymphedema / Segmental Compressive Device / Other: Optional: One layer of unna paste to top of compression wrap (to act as an anchor). - Unna paste on calf to secure wrap in place as needed 4 Layer Compression System Lymphedema. - bi lat, nystatin powder between toes , 4x4 gauze between toes , ABD to cover toes; zetuvit over top of foot, charcoal pad , tubi grip C to foot and ankle double layer COTTON LAYER SPIRAL, LIGHT TAN SPIRAL. WHITE WITH YELLOW LINE FIGURE 8 , COBAN SPIRAL Tubigrip double layer applied - size C left foot ankle Elevate, Exercise Daily and Avoid Standing for Long Periods of Time. Elevate legs to the level of the heart and pump ankles as often as possible Elevate leg(s) parallel to the floor when sitting. Compression Pump:  Use compression pump on left lower extremity for 60 minutes, twice daily. - 2 times per day DO YOUR BEST to sleep in the bed at night. DO NOT sleep in your recliner. Long hours of sitting in a recliner leads to swelling of the legs and/or potential wounds on your backside. Other: - Contact prescriber regarding use of diuretics to reduce fluid overload. Off-Loading: Turn and reposition every 2 hours Additional Orders / Instructions: Follow Nutritious Diet and Increase Protein Intake RUSTYN, CONERY ([id number]) 1. I would recommend currently that we go ahead and continue with the wound care measures as before and the patient is in agreement with plan. This includes the use of the 4-layer compression wraps bilaterally that seems to be doing well. We are using Tubigrip over the toes and then to the foot. 2. I am also can recommend that we have the patient continue to monitor for any signs of worsening or infection. Obviously right now he is doing well I am still in the works of trying to figure out what to do to see about getting the [place] to cover for his custom compression which I do believe he needs. 3. I am also can recommend that we have the patient continue with the elevation of his legs much as possible to try to help with edema control I think this is still of utmost importance. We will see patient back for reevaluation in 1 week here in the clinic. If anything worsens or changes patient will contact our office for additional recommendations. Electronic Signature(s) Signed: 11/02/2021 1:10:32 PM By: Worthy Keeler PA-C Entered By: Worthy Keeler on  11/02/2021 13:10:32 GUERINO, CAPORALE ([id number]) -------------------------------------------------------------------------------- SuperBill Details Patient Name: [name], [name] Date of Service: 11/02/2021 Medical Record Number: [id number] Patient Account Number: [id number] Date of Birth/Sex: 09-Aug-1950 (71 y.o. M) Treating RN: [name] Primary  Care Provider: Fontaine No Other Clinician: Referring Provider: Fontaine No Treating Provider/Extender: Skipper Cliche in Treatment: 40 Diagnosis Coding ICD-10 Codes Code Description E11.622 Type 2 diabetes mellitus with other skin ulcer I89.0 Lymphedema, not elsewhere classified I87.2 Venous insufficiency (chronic) (peripheral) L97.822 Non-pressure chronic ulcer of other part of left lower leg with fat layer exposed L97.512 Non-pressure chronic ulcer of other part of right foot with fat layer exposed I10 Essential (primary) hypertension L97.811 Non-pressure chronic ulcer of other part of right lower leg limited to breakdown of skin Facility Procedures CPT4 Code: 11572620 Description: [phone] - WOUND CARE VISIT-LEV 2 EST PT Modifier: Quantity: 1 Physician Procedures CPT4 Code: 4163845 Description: 36468 - WC PHYS LEVEL 3 - EST PT Modifier: Quantity: 1 CPT4 Code: Description: ICD-10 Diagnosis Description E11.622 Type 2 diabetes mellitus with other skin ulcer I89.0 Lymphedema, not elsewhere classified I87.2 Venous insufficiency (chronic) (peripheral) L97.822 Non-pressure chronic ulcer of other part of left lower  leg with fat layer Modifier: exposed Quantity: Electronic Signature(s) Signed: 11/02/2021 1:19:34 PM By: [name] RN Signed: 11/05/2021 4:59:17 PM By: Worthy Keeler PA-C Previous Signature: 11/02/2021 1:11:00 PM Version By: Worthy Keeler PA-C Entered By: [name] on 11/02/2021 13:19:34

## 2021-11-02 NOTE — Progress Notes (Addendum)
ZACCHAEUS, HALM ([id number]) Visit Report for 11/02/2021 Arrival Information Details Patient Name: [name], [name] Date of Service: 11/02/2021 11:00 AM Medical Record Number: [id number] Patient Account Number: [id number] Date of Birth/Sex: Sep 17, 1950 (71 y.o. M) Treating RN: [name] Primary Care Shwanda Soltis: Fontaine No Other Clinician: Referring Chasta Deshpande: Fontaine No Treating Sharaya Boruff/Extender: Skipper Cliche in Treatment: 82 Visit Information History Since Last Visit All ordered tests and consults were completed: No Patient Arrived: [name] Added or deleted any medications: No Arrival Time: 11:03 Any new allergies or adverse reactions: No Accompanied By: self Had a fall or experienced change in No Transfer Assistance: Stretcher activities of daily living that may affect Patient Identification Verified: No risk of falls: Secondary Verification Process Completed: No Signs or symptoms of abuse/neglect since last visito No Patient Requires Transmission-Based No Hospitalized since last visit: No Precautions: Implantable device outside of the clinic excluding No Patient Has Alerts: Yes cellular tissue based products placed in the center Patient Alerts: AVVS consult on file since last visit: Last ABI-R 1.09; L Has Dressing in Place as Prescribed: Yes 1.04 Has Compression in Place as Prescribed: Yes Pain Present Now: No Electronic Signature(s) Signed: 11/03/2021 8:01:30 AM By: [name] RN Entered By: [name] on 11/02/2021 11:27:43 [name] ([id number]) -------------------------------------------------------------------------------- Clinic Level of Care Assessment Details Patient Name: [name] Date of Service: 11/02/2021 11:00 AM Medical Record Number: [id number] Patient Account Number: [id number] Date of Birth/Sex: March 20, 1951 (71 y.o. M) Treating RN: [name] Primary Care Breawna Monten[place]: Fontaine No Other Clinician: Referring Tarini Carrier:  Fontaine No Treating Tongela Encinas/Extender: Skipper Cliche in Treatment: 40 Clinic Level of Care Assessment Items TOOL 4 Quantity Score X - Use when only an EandM is performed on FOLLOW-UP visit 1 0 ASSESSMENTS - Nursing Assessment / Reassessment X - Reassessment of Co-morbidities (includes updates in patient status) 1 10 X- 1 5 Reassessment of Adherence to Treatment Plan ASSESSMENTS - Wound and Skin Assessment / Reassessment [] - Simple Wound Assessment / Reassessment - one wound 0 [] - 0 Complex Wound Assessment / Reassessment - multiple wounds X- 1 10 Dermatologic / Skin Assessment (not related to wound area) ASSESSMENTS - Focused Assessment [] - Circumferential Edema Measurements - multi extremities 0 [] - 0 Nutritional Assessment / Counseling / Intervention [] - 0 Lower Extremity Assessment (monofilament, tuning fork, pulses) [] - 0 Peripheral Arterial Disease Assessment (using hand held doppler) ASSESSMENTS - Ostomy and/or Continence Assessment and Care [] - Incontinence Assessment and Management 0 [] - 0 Ostomy Care Assessment and Management (repouching, etc.) PROCESS - Coordination of Care [] - Simple Patient / Family Education for ongoing care 0 [] - 0 Complex (extensive) Patient / Family Education for ongoing care [] - 0 Staff obtains Programmer, systems, Records, Test Results / Process Orders [] - 0 Staff telephones HHA, Nursing Homes / Clarify orders / etc [] - 0 Routine Transfer to another Facility (non-emergent condition) [] - 0 Routine Hospital Admission (non-emergent condition) [] - 0 New Admissions / Biomedical engineer / Ordering NPWT, Apligraf, etc. [] - 0 Emergency Hospital Admission (emergent condition) X- 1 10 Simple Discharge Coordination [] - 0 Complex (extensive) Discharge Coordination PROCESS - Special Needs [] - Pediatric / Minor Patient Management 0 [] - 0 Isolation Patient Management [] - 0 Hearing / Language / Visual special  needs [] - 0 Assessment of Community assistance (transportation, D/C planning, etc.) [] - 0 Additional assistance / Altered mentation [] - 0 Support Surface(s) Assessment (bed, cushion, seat, etc.) INTERVENTIONS - Wound Cleansing /  Measurement [place], [name] ([id number]) [] - 0 Simple Wound Cleansing - one wound [] - 0 Complex Wound Cleansing - multiple wounds [] - 0 Wound Imaging (photographs - any number of wounds) [] - 0 Wound Tracing (instead of photographs) [] - 0 Simple Wound Measurement - one wound [] - 0 Complex Wound Measurement - multiple wounds INTERVENTIONS - Wound Dressings [] - Small Wound Dressing one or multiple wounds 0 [] - 0 Medium Wound Dressing one or multiple wounds [] - 0 Large Wound Dressing one or multiple wounds X- 1 5 Application of Medications - topical [] - 0 Application of Medications - injection INTERVENTIONS - Miscellaneous [] - External ear exam 0 [] - 0 Specimen Collection (cultures, biopsies, blood, body fluids, etc.) [] - 0 Specimen(s) / Culture(s) sent or taken to Lab for analysis [] - 0 Patient Transfer (multiple staff / Civil Service fast streamer / Similar devices) [] - 0 Simple Staple / Suture removal (25 or less) [] - 0 Complex Staple / Suture removal (26 or more) [] - 0 Hypo / Hyperglycemic Management (close monitor of Blood Glucose) [] - 0 Ankle / Brachial Index (ABI) - do not check if billed separately X- 1 5 Vital Signs Has the patient been seen at the hospital within the last three years: Yes Total Score: 45 Level Of Care: New/Established - Level 2 Electronic Signature(s) Signed: 11/03/2021 8:01:30 AM By: [name] RN Entered By: [name] on 11/02/2021 13:19:22 [name] ([id number]) -------------------------------------------------------------------------------- Encounter Discharge Information Details Patient Name: [name] Date of Service: 11/02/2021 11:00 AM Medical Record Number: [id number] Patient Account  Number: [id number] Date of Birth/Sex: 11/01/1950 (71 y.o. M) Treating RN: [name] Primary Care Provider: Fontaine No Other Clinician: Referring Provider: Fontaine No Treating Provider/Extender: Skipper Cliche in Treatment: 40 Encounter Discharge Information Items Discharge Condition: Stable Ambulatory Status: Cane Discharge Destination: Home Transportation: Private Auto Accompanied By: self Schedule Follow-up Appointment: Yes Clinical Summary of Care: Electronic Signature(s) Signed: 11/02/2021 1:20:21 PM By: [name] RN Entered By: [name] on 11/02/2021 13:20:20 [name] ([id number]) -------------------------------------------------------------------------------- Lower Extremity Assessment Details Patient Name: [name] Date of Service: 11/02/2021 11:00 AM Medical Record Number: [id number] Patient Account Number: [id number] Date of Birth/Sex: [birth year] (71 y.o. M) Treating RN: [name] Primary Care Provider: Fontaine No Other Clinician: Referring Provider: Fontaine No Treating Provider/Extender: Skipper Cliche in Treatment: 40 Edema Assessment Assessed: [Left: No] [Right: No] Edema: [Left: Yes] [Right: Yes] Calf Left: Right: Point of Measurement: 38 cm From Medial Instep 35 cm 35.5 cm Ankle Left: Right: Point of Measurement: 14 cm From Medial Instep 24.5 cm 24.3 cm Vascular Assessment Pulses: Dorsalis Pedis Palpable: [Left:Yes] [Right:Yes] Electronic Signature(s) Signed: 11/03/2021 8:01:30 AM By: [name] RN Entered By: [name] on 11/02/2021 11:29:07 [name] ([id number]) -------------------------------------------------------------------------------- Multi Wound Chart Details Patient Name: [name] Date of Service: 11/02/2021 11:00 AM Medical Record Number: [id number] Patient Account Number: [id number] Date of Birth/Sex: [birth year] (71 y.o. M) Treating RN: [name] Primary Care  Provider: Fontaine No Other Clinician: Referring Provider: Fontaine No Treating Provider/Extender: Skipper Cliche in Treatment: 40 Vital Signs Height(in): Pulse(bpm): 68 Weight(lbs): 312 Blood Pressure(mmHg): 151/68 Body Mass Index(BMI): Temperature(F): 98.2 Respiratory Rate(breaths/min): 18 Wound Assessments Treatment Notes Electronic Signature(s) Signed: 11/03/2021 8:01:30 AM By: [name] RN Entered By: [name] on 11/02/2021 11:29:42 [name] ([id number]) -------------------------------------------------------------------------------- [place] Details Patient Name: [name] Date of Service: 11/02/2021 11:00 AM Medical Record Number: [id number] Patient Account Number: [id number] Date of Birth/Sex: [birth year] (  71 y.o. M) Treating RN: [name] Primary Care Provider: Fontaine No Other Clinician: Referring Provider: Fontaine No Treating Provider/Extender: Skipper Cliche in Treatment: 40 Active Inactive Soft Tissue Infection Nursing Diagnoses: Impaired tissue integrity Knowledge deficit related to disease process and management Knowledge deficit related to home infection control: handwashing, handling of soiled dressings, supply storage Potential for infection: soft tissue Goals: Patient/caregiver will verbalize understanding of or measures to prevent infection and contamination in the home setting Date Initiated: 09/06/2021 Date Inactivated: 10/18/2021 Target Resolution Date: 09/06/2021 Goal Status: Met Patient's soft tissue infection will resolve Date Initiated: 09/06/2021 Date Inactivated: 10/18/2021 Target Resolution Date: 09/06/2021 Goal Status: Met Signs and symptoms of infection will be recognized early to allow for prompt treatment Date Initiated: 09/06/2021 Target Resolution Date: 10/29/2021 Goal Status: Active Interventions: Assess signs and symptoms of infection every visit Provide  education on infection Treatment Activities: Education provided on Infection : 10/26/2021 Systemic antibiotics : 09/06/2021 Notes: Electronic Signature(s) Signed: 11/03/2021 8:01:30 AM By: [name] RN Entered By: [name] on 11/02/2021 11:29:15 [name] ([id number]) -------------------------------------------------------------------------------- Pain Assessment Details Patient Name: [name] Date of Service: 11/02/2021 11:00 AM Medical Record Number: [id number] Patient Account Number: [id number] Date of Birth/Sex: [birth year] (71 y.o. M) Treating RN: [name] Primary Care Provider: Fontaine No Other Clinician: Referring Provider: Fontaine No Treating Provider/Extender: Skipper Cliche in Treatment: 40 Active Problems Location of Pain Severity and Description of Pain Patient Has Paino No Site Locations Pain Management and Medication Current Pain Management: Electronic Signature(s) Signed: 11/03/2021 8:01:30 AM By: [name] RN Entered By: [name] on 11/02/2021 11:28:20 [name] ([id number]) -------------------------------------------------------------------------------- Patient/Caregiver Education Details Patient Name: [name] Date of Service: 11/02/2021 11:00 AM Medical Record Number: [id number] Patient Account Number: [id number] Date of Birth/Gender: [birth year] (71 y.o. M) Treating RN: [name] Primary Care Physician: Fontaine No Other Clinician: Referring Physician: Fontaine No Treating Physician/Extender: Skipper Cliche in Treatment: 62 Education Assessment Education Provided To: Patient Education Topics Provided Infection: Methods: Explain/Verbal Responses: State content correctly Electronic Signature(s) Signed: 11/03/2021 8:01:30 AM By: [name] RN Entered By: [name] on 11/02/2021 13:19:44 [name]  ([id number]) -------------------------------------------------------------------------------- Vitals Details Patient Name: [name] Date of Service: 11/02/2021 11:00 AM Medical Record Number: [id number] Patient Account Number: [id number] Date of Birth/Sex: February 25, 1951 (71 y.o. M) Treating RN: [name] Primary Care Provider: Fontaine No Other Clinician: Referring Provider: Fontaine No Treating Provider/Extender: Skipper Cliche in Treatment: 40 Vital Signs Time Taken: 11:27 Temperature (F): 98.2 Weight (lbs): 312 Pulse (bpm): 68 Respiratory Rate (breaths/min): 18 Blood Pressure (mmHg): 151/68 Reference Range: 80 - 120 mg / dl Electronic Signature(s) Signed: 11/03/2021 8:01:30 AM By: [name] RN Entered By: [name] on 11/02/2021 11:28:14

## 2021-11-03 ENCOUNTER — Ambulatory Visit: Payer: PPO | Admitting: Occupational Therapy

## 2021-11-08 ENCOUNTER — Encounter: Payer: No Typology Code available for payment source | Attending: Physician Assistant | Admitting: Physician Assistant

## 2021-11-08 DIAGNOSIS — E11622 Type 2 diabetes mellitus with other skin ulcer: Secondary | ICD-10-CM | POA: Diagnosis present

## 2021-11-08 DIAGNOSIS — I5040 Unspecified combined systolic (congestive) and diastolic (congestive) heart failure: Secondary | ICD-10-CM | POA: Insufficient documentation

## 2021-11-08 DIAGNOSIS — I110 Hypertensive heart disease with heart failure: Secondary | ICD-10-CM | POA: Insufficient documentation

## 2021-11-08 DIAGNOSIS — E1151 Type 2 diabetes mellitus with diabetic peripheral angiopathy without gangrene: Secondary | ICD-10-CM | POA: Diagnosis not present

## 2021-11-08 DIAGNOSIS — I2510 Atherosclerotic heart disease of native coronary artery without angina pectoris: Secondary | ICD-10-CM | POA: Diagnosis not present

## 2021-11-08 DIAGNOSIS — I872 Venous insufficiency (chronic) (peripheral): Secondary | ICD-10-CM | POA: Diagnosis not present

## 2021-11-08 DIAGNOSIS — L97512 Non-pressure chronic ulcer of other part of right foot with fat layer exposed: Secondary | ICD-10-CM | POA: Insufficient documentation

## 2021-11-08 DIAGNOSIS — E11621 Type 2 diabetes mellitus with foot ulcer: Secondary | ICD-10-CM | POA: Insufficient documentation

## 2021-11-08 DIAGNOSIS — E1142 Type 2 diabetes mellitus with diabetic polyneuropathy: Secondary | ICD-10-CM | POA: Insufficient documentation

## 2021-11-08 DIAGNOSIS — I890 Lymphedema, not elsewhere classified: Secondary | ICD-10-CM | POA: Diagnosis not present

## 2021-11-08 DIAGNOSIS — L97822 Non-pressure chronic ulcer of other part of left lower leg with fat layer exposed: Secondary | ICD-10-CM | POA: Insufficient documentation

## 2021-11-08 DIAGNOSIS — L97811 Non-pressure chronic ulcer of other part of right lower leg limited to breakdown of skin: Secondary | ICD-10-CM | POA: Insufficient documentation

## 2021-11-08 NOTE — Progress Notes (Addendum)
[name] ([id number]) Visit Report for 11/08/2021 Arrival Information Details Patient Name: [name] Date of Service: 11/08/2021 2:00 PM Medical Record Number: [id number] Patient Account Number: [id number] Date of Birth/Sex: 1950/09/03 (71 y.o. M) Treating RN: [name] Primary Care Baine Decesare: Fontaine No Other Clinician: Referring Tolulope Pinkett: Fontaine No Treating Torie Priebe/Extender: Skipper Cliche in Treatment: 19 Visit Information History Since Last Visit All ordered tests and consults were completed: No Patient Arrived: [name] Added or deleted any medications: No Arrival Time: 14:17 Any new allergies or adverse reactions: No Accompanied By: self Had a fall or experienced change in No Transfer Assistance: None activities of daily living that may affect Patient Identification Verified: Yes risk of falls: Secondary Verification Process Completed: Yes Signs or symptoms of abuse/neglect since last visito No Patient Requires Transmission-Based No Hospitalized since last visit: No Precautions: Implantable device outside of the clinic excluding No Patient Has Alerts: Yes cellular tissue based products placed in the center Patient Alerts: AVVS consult on file since last visit: Last ABI-R 1.09; L Has Dressing in Place as Prescribed: Yes 1.04 Has Compression in Place as Prescribed: Yes Pain Present Now: No Electronic Signature(s) Signed: 11/08/2021 4:23:03 PM By: [name] RN Entered By: [name] on 11/08/2021 14:20:04 [name] ([id number]) -------------------------------------------------------------------------------- Clinic Level of Care Assessment Details Patient Name: [name] Date of Service: 11/08/2021 2:00 PM Medical Record Number: [id number] Patient Account Number: [id number] Date of Birth/Sex: 1950-06-16 (71 y.o. M) Treating RN: [name] Primary Care Blu Mcglaun: Fontaine No Other Clinician: Referring Torrance Stockley: Fontaine No Treating Pearle Wandler/Extender: Skipper Cliche in Treatment: 40 Clinic Level of Care Assessment Items TOOL 1 Quantity Score '[]'  - Use when EandM and Procedure is performed on INITIAL visit 0 ASSESSMENTS - Nursing Assessment / Reassessment '[]'  - General Physical Exam (combine w/ comprehensive assessment (listed just below) when performed on new 0 pt. evals) '[]'  - 0 Comprehensive Assessment (HX, ROS, Risk Assessments, Wounds Hx, etc.) ASSESSMENTS - Wound and Skin Assessment / Reassessment '[]'  - Dermatologic / Skin Assessment (not related to wound area) 0 ASSESSMENTS - Ostomy and/or Continence Assessment and Care '[]'  - Incontinence Assessment and Management 0 '[]'  - 0 Ostomy Care Assessment and Management (repouching, etc.) PROCESS - Coordination of Care '[]'  - Simple Patient / Family Education for ongoing care 0 '[]'  - 0 Complex (extensive) Patient / Family Education for ongoing care '[]'  - 0 Staff obtains Programmer, systems, Records, Test Results / Process Orders '[]'  - 0 Staff telephones HHA, Nursing Homes / Clarify orders / etc '[]'  - 0 Routine Transfer to another Facility (non-emergent condition) '[]'  - 0 Routine Hospital Admission (non-emergent condition) '[]'  - 0 New Admissions / Biomedical engineer / Ordering NPWT, Apligraf, etc. '[]'  - 0 Emergency Hospital Admission (emergent condition) PROCESS - Special Needs '[]'  - Pediatric / Minor Patient Management 0 '[]'  - 0 Isolation Patient Management '[]'  - 0 Hearing / Language / Visual special needs '[]'  - 0 Assessment of Community assistance (transportation, D/C planning, etc.) '[]'  - 0 Additional assistance / Altered mentation '[]'  - 0 Support Surface(s) Assessment (bed, cushion, seat, etc.) INTERVENTIONS - Miscellaneous '[]'  - External ear exam 0 '[]'  - 0 Patient Transfer (multiple staff / Civil Service fast streamer / Similar devices) '[]'  - 0 Simple Staple / Suture removal (25 or less) '[]'  - 0 Complex Staple / Suture removal (26 or more) '[]'  -  0 Hypo/Hyperglycemic Management (do not check if billed separately) '[]'  - 0 Ankle / Brachial Index (ABI) - do not check if billed separately Has the patient been  seen at the hospital within the last three years: Yes Total Score: 0 Level Of Care: ____ [name] ([id number]) Electronic Signature(s) Signed: 11/08/2021 4:23:03 PM By: [name] RN Entered By: [name] on 11/08/2021 14:46:37 [name] ([id number]) -------------------------------------------------------------------------------- Compression Therapy Details Patient Name: [name] Date of Service: 11/08/2021 2:00 PM Medical Record Number: [id number] Patient Account Number: [id number] Date of Birth/Sex: 1950-08-01 (71 y.o. M) Treating RN: [name] Primary Care Kirby Argueta: Fontaine No Other Clinician: Referring Braley Luckenbaugh: Fontaine No Treating Dellamae Rosamilia/Extender: Skipper Cliche in Treatment: 40 Compression Therapy Performed for Wound Assessment: NonWound Condition Lymphedema - Left Leg Performed By: Clinician [name], RN Compression Type: Four Layer Post Procedure Diagnosis Same as Pre-procedure Electronic Signature(s) Signed: 11/08/2021 4:23:03 PM By: [name] RN Entered By: [name] on 11/08/2021 14:46:09 [name] ([id number]) -------------------------------------------------------------------------------- Compression Therapy Details Patient Name: [name] Date of Service: 11/08/2021 2:00 PM Medical Record Number: [id number] Patient Account Number: [id number] Date of Birth/Sex: 01-20-1951 (71 y.o. M) Treating RN: [name] Primary Care [name] Osburn: Fontaine No Other Clinician: Referring Sherolyn Trettin: Fontaine No Treating Sakara Lehtinen/Extender: Skipper Cliche in Treatment: 40 Compression Therapy Performed for Wound Assessment: NonWound Condition Lymphedema - Right Leg Performed By: Clinician [name], RN Compression Type: Four Layer Post Procedure  Diagnosis Same as Pre-procedure Electronic Signature(s) Signed: 11/08/2021 4:23:03 PM By: [name] RN Entered By: [name] on 11/08/2021 14:46:24 [name] ([id number]) -------------------------------------------------------------------------------- Encounter Discharge Information Details Patient Name: [name] Date of Service: 11/08/2021 2:00 PM Medical Record Number: [id number] Patient Account Number: [id number] Date of Birth/Sex: 1950-11-18 (71 y.o. M) Treating RN: [name] Primary Care Nisreen Guise: Fontaine No Other Clinician: Referring Haru Anspaugh: Fontaine No Treating Malikah Principato/Extender: Skipper Cliche in Treatment: 40 Encounter Discharge Information Items Discharge Condition: Stable Ambulatory Status: Cane Discharge Destination: Home Transportation: Private Auto Accompanied By: self Schedule Follow-up Appointment: Yes Clinical Summary of Care: Electronic Signature(s) Signed: 11/08/2021 4:23:03 PM By: [name] RN Entered By: [name] on 11/08/2021 14:48:30 [name] ([id number]) -------------------------------------------------------------------------------- Lower Extremity Assessment Details Patient Name: [name] Date of Service: 11/08/2021 2:00 PM Medical Record Number: [id number] Patient Account Number: [id number] Date of Birth/Sex: 1951-03-27 (71 y.o. M) Treating RN: [name] Primary Care Tenicia Gural: Fontaine No Other Clinician: Referring Rayvn Rickerson: Fontaine No Treating Krystyl Cannell/Extender: Skipper Cliche in Treatment: 40 Edema Assessment Assessed: [Left: No] [Right: No] [Left: Edema] [Right: :] Calf Left: Right: Point of Measurement: 38 cm From Medial Instep 35 cm 35.4 cm Ankle Left: Right: Point of Measurement: 14 cm From Medial Instep 24 cm 24 cm Vascular Assessment Pulses: Dorsalis Pedis Palpable: [Left:Yes] [Right:Yes] Electronic Signature(s) Signed: 11/08/2021 4:23:03 PM By: [name]  RN Entered By: [name] on 11/08/2021 14:34:27 [name] ([id number]) -------------------------------------------------------------------------------- Multi Wound Chart Details Patient Name: [name] Date of Service: 11/08/2021 2:00 PM Medical Record Number: [id number] Patient Account Number: [id number] Date of Birth/Sex: 1951-01-01 (71 y.o. M) Treating RN: [name] Primary Care Renny Gunnarson: Fontaine No Other Clinician: Referring Selen Smucker: Fontaine No Treating Khyleigh Furney/Extender: Skipper Cliche in Treatment: 40 Vital Signs Height(in): Pulse(bpm): 55 Weight(lbs): 312 Blood Pressure(mmHg): 126/52 Body Mass Index(BMI): Temperature(F): 98.1 Respiratory Rate(breaths/min): 18 Wound Assessments Treatment Notes Electronic Signature(s) Signed: 11/08/2021 4:23:03 PM By: [name] RN Entered By: [name] on 11/08/2021 14:34:42 [name] ([id number]) -------------------------------------------------------------------------------- [place] Details Patient Name: [name] Date of Service: 11/08/2021 2:00 PM Medical Record Number: [id number] Patient Account Number: [id number] Date of Birth/Sex: Mar 16, 1951 (71 y.o. M) Treating RN: [name] Primary Care Zeenat Jeanbaptiste: Fontaine No Other Clinician:  Referring Jemima Petko: Fontaine No Treating Kamsiyochukwu Spickler/Extender: Skipper Cliche in Treatment: 2 Active Inactive Soft Tissue Infection Nursing Diagnoses: Impaired tissue integrity Knowledge deficit related to disease process and management Knowledge deficit related to home infection control: handwashing, handling of soiled dressings, supply storage Potential for infection: soft tissue Goals: Patient/caregiver will verbalize understanding of or measures to prevent infection and contamination in the home setting Date Initiated: 09/06/2021 Date Inactivated: 10/18/2021 Target Resolution Date: 09/06/2021 Goal Status:  Met Patient's soft tissue infection will resolve Date Initiated: 09/06/2021 Date Inactivated: 10/18/2021 Target Resolution Date: 09/06/2021 Goal Status: Met Signs and symptoms of infection will be recognized early to allow for prompt treatment Date Initiated: 09/06/2021 Target Resolution Date: 10/29/2021 Goal Status: Active Interventions: Assess signs and symptoms of infection every visit Provide education on infection Treatment Activities: Education provided on Infection : 11/02/2021 Systemic antibiotics : 09/06/2021 Notes: Electronic Signature(s) Signed: 11/08/2021 4:23:03 PM By: [name] RN Entered By: [name] on 11/08/2021 14:34:35 [name] ([id number]) -------------------------------------------------------------------------------- Pain Assessment Details Patient Name: [name] Date of Service: 11/08/2021 2:00 PM Medical Record Number: [id number] Patient Account Number: [id number] Date of Birth/Sex: May 05, 1951 (71 y.o. M) Treating RN: [name] Primary Care Derk Doubek: Fontaine No Other Clinician: Referring Kasie Leccese: Fontaine No Treating Jaleyah Longhi/Extender: Skipper Cliche in Treatment: 40 Active Problems Location of Pain Severity and Description of Pain Patient Has Paino No Site Locations Pain Management and Medication Current Pain Management: Electronic Signature(s) Signed: 11/08/2021 4:23:03 PM By: [name] RN Entered By: [name] on 11/08/2021 14:20:45 [name] ([id number]) -------------------------------------------------------------------------------- Patient/Caregiver Education Details Patient Name: [name] Date of Service: 11/08/2021 2:00 PM Medical Record Number: [id number] Patient Account Number: [id number] Date of Birth/Gender: Jun 25, 1950 (71 y.o. M) Treating RN: [name] Primary Care Physician: Fontaine No Other Clinician: Referring Physician: Fontaine No Treating Physician/Extender: Skipper Cliche in Treatment: 34 Education Assessment Education Provided To: Patient Education Topics Provided Infection: Methods: Explain/Verbal Responses: State content correctly Electronic Signature(s) Signed: 11/08/2021 4:23:03 PM By: [name] RN Entered By: [name] on 11/08/2021 14:47:00 [name] ([id number]) -------------------------------------------------------------------------------- [place] Details Patient Name: [name] Date of Service: 11/08/2021 2:00 PM Medical Record Number: [id number] Patient Account Number: [id number] Date of Birth/Sex: 09-13-1950 (71 y.o. M) Treating RN: [name] Primary Care Kona Yusuf: Fontaine No Other Clinician: Referring Weston Fulco: Fontaine No Treating Yazeed Pryer/Extender: Skipper Cliche in Treatment: 40 Vital Signs Time Taken: 14:20 Temperature (F): 98.1 Weight (lbs): 312 Pulse (bpm): 55 Respiratory Rate (breaths/min): 18 Blood Pressure (mmHg): 126/52 Reference Range: 80 - 120 mg / dl Electronic Signature(s) Signed: 11/08/2021 4:23:03 PM By: [name] RN Entered By: [name] on 11/08/2021 14:20:36

## 2021-11-08 NOTE — Progress Notes (Addendum)
AYCEN, PORRECA ([id number]) Visit Report for 11/08/2021 Chief Complaint Document Details Patient Name: [name] Date of Service: 11/08/2021 2:00 PM Medical Record Number: [id number] Patient Account Number: [id number] Date of Birth/Sex: Sep 19, 1950 (71 y.o. M) Treating RN: [name] Primary Care Provider: Fontaine No Other Clinician: Referring Provider: Fontaine No Treating Provider/Extender: Skipper Cliche in Treatment: 26 Information Obtained from: Patient Chief Complaint Left LE ulcers Electronic Signature(s) Signed: 11/08/2021 1:58:23 PM By: Worthy Keeler PA-C Entered By: Worthy Keeler on 11/08/2021 13:58:23 [name] ([id number]) -------------------------------------------------------------------------------- HPI Details Patient Name: [name] Date of Service: 11/08/2021 2:00 PM Medical Record Number: [id number] Patient Account Number: [id number] Date of Birth/Sex: 1950-10-16 (71 y.o. M) Treating RN: [name] Primary Care Provider: Fontaine No Other Clinician: Referring Provider: Fontaine No Treating Provider/Extender: Skipper Cliche in Treatment: 56 History of Present Illness HPI Description: 71 year old male who presented to the ER with bilateral lower extremity blisters which had started last week. he has a past medical history of leukemia, diabetes mellitus, hypertension, edema of both lower extremities, his recurrent skin infections, peripheral vascular disease, coronary artery disease, congestive heart failure and peripheral neuropathy. in the ER he was given Rocephin and put on Silvadene cream. he was put on oral doxycycline and was asked to follow-up with the [facility]. His last hemoglobin A1c was 6.6 in December and he checks his blood sugar once a week. He does not have any physicians outside the [place] system. He does not recall any vascular duplex studies done either for arterial or venous disease but was told to wear  compression stockings which he does not use 05/30/2016 -- we have not yet received any of his notes from the [facility] hospital system and his arterial and venous duplex studies are scheduled here in [place] around mid February. We are unable to have his insurance accepted by home health agencies and hence he is getting dressings only once a week. 06/06/16 -- -- I received a call from the patient's PCP at the [facility] and spoke to Dr. Garvin Fila, phone number [phone] and fax number [phone]. She confirmed that no vascular testing was done over the last 5 years and she would be happy to do them if the patient did want them to be done at the [place] and we could fax him a request. Readmission: 71 year old male seen by as in February of this year and was referred to vein and vascular for studies and opinion from the vascular surgeons. The patient returns today with a fresh problem having had blisters on his left lower extremity which have been there for about 5 days and he clearly states that he has been wearing his compression stockings as advised though he could not read the moderate compression and has been wearing light compression. Review of his electronic medical records note that he had lower extremity arterial duplex examination done on 06/23/2016 which showed no hemodynamically significant stenosis in the bilateral lower extremity arterial system. He also had a lower extremity venous reflux examination done on 07/07/2016 and it was noted that he had venous incompetence in the right great saphenous vein and bilateral common femoral veins. Patient was seen by Dr. Tamala Julian on the same day and for some reason his notes do not reflect the venous studies or the arterial studies and he recommended patient do a venous duplex ultrasound to look for reflux and return to see him.he would also consider a lymph pump if required. The patient was told that his workup  was normal and hence the patient  canceled his follow-up appointment. 02/03/17 on evaluation today patient left medial lower extremity blister appears to be doing about the same. It is still continuing to drain and there's still the blistered skin covering the wound bed which is making it difficult for the alternate to do its job. Fortunately there is no evidence of cellulitis. No fevers chills noted. Patient states in general he is not having any significant discomfort. Patient's lower extremity arterial duplex exam revealed that patient was hemodynamically stable with no evidence of stenosis in regard to the bilateral lower extremities. The lower extremity venous reflux exam revealed the patient had venous incontinence noted in the right greater saphenous and bilateral common femoral vein. There is no evidence of deep or superficial vein thrombosis in the bilateral lower extremities. Readmission: 11/12/18 Patient presents for evaluation our clinic today concerning issues that he is having with his left lower extremity. He tells me that a couple weeks ago he began developing blisters on the left lower extremity along with increased swelling. He typically wears his compression stockings on a regular basis is previously been evaluated both here as well is with vascular surgery they would recommend lymphedema pumps but unfortunately that somehow fell through and he never heard anything back from that. Nonetheless I think lymphedema pumps would be beneficial for this patient. He does have a history of hypertension and diabetes. Obviously the chronic venous stasis and lymphedema as well. At this point the blisters have been given in more trouble he states sometimes when the blisters openings able to clean it down with alcohol and it will dry out and do well. Unfortunately that has not been the case this time. He is having some discomfort although this mean these with cleaning the areas he doesn't have discomfort just on a regular basis. He  has not been able to wear his compression stockings since the blisters arose due to the fact that of course it will drain into the socks causing additional issues and he didn't have any way to wrap this otherwise. He has increased to taking his Lasix every day instead of every other day. He sees his primary care provider later this month as well. No fevers, chills, nausea, or vomiting noted at this time. 11/19/18-Patient returns at 1 week, per intake RN the amount of seepage into the compression wraps was definitely improved, overall all the wounds are measuring smaller but continuing silver alginate to the wounds as primary dressing 11/26/18 on evaluation today patient appears to be doing quite well in regard to his left lower [place] ulcers. In fact of the areas that were noted initially he only has two regions still open. There is no evidence of active infection at this time. He still is not heard anything from the company regarding lymphedema pumps as of yet. Again as previously seen vascular they have not recommended any surgical intervention. 12/03/2018 on evaluation today patient actually appears to be doing quite well with regard to his lower extremity ulcers. In fact most of the areas appear to be healed the one spot which does not seem to be completely healed I am unsure of whether or not this is really draining that much but nonetheless there does not appear to be any signs of infection or significant drainage at this point. There is no sign of fever, chills, nausea, vomiting, or diarrhea. Overall I am pleased with how things have progressed I think is very close to being able to transition  to his home compression stockings. FIRAS, GUARDADO ([id number]) 12/10/2018 upon evaluation today patient appears to be doing quite well with regard to his left lower extremity. He has been tolerating the dressing changes without complication. Fortunately there is no signs of active infection at this time. He  appears after thorough evaluation of his leg to only have 1 small area that remains open at this point everything else appears to be almost completely closed. He still have significant swelling of the left lower extremity. We had discussed discussing this with his primary care provider he is not able to see her in person they were at the [facility] and right now the [place] is not seeing patients on site. According to the patient anyway. Subsequently he did speak with her apparently and his primary care provider feels that he may likely have a DVT. With that being said she has not seen his leg she is just going off of his history. Nonetheless that is a concern that the patient now has as well and while I do not feel the DVT is likely we can definitely ensure that that is not the case I will go ahead and see about putting that order in today. Nonetheless otherwise I am in a recommend that we continue with the current wound care measures including the compression therapy most likely. We just need to ensure that his leg is indeed free of any DVTs. 12/17/2018 on evaluation today patient actually appears to be completely healed today. He does have 2 very small areas of blistering although this is not anything too significant at this point which is good news. With that being said I am in agreement with the fact that I think he is completely healed at this point. He does want to get back into his compression stocking. The good news is we have gotten approval from insurance for his lymphedema pumps we received a letter since last saw him last week. The other good news is his study did come back and showed no evidence of a DVT. 12/20/2018 on evaluation today patient presents for follow-up concerning his ongoing issues with his left lower extremity. He was actually discharged last Friday and did fairly well until he states blisters opened this morning. He tells me he has been wearing his compression stocking although  he has a hard time getting this on. There does not appear to be any signs of active infection at this time. No fevers, chills, nausea, vomiting, or diarrhea. 12/27/2018 on evaluation today patient appears to be doing very well with regard to his swelling of the left lower extremity the 4 layer compression wrap seems to have been beneficial for him. Fortunately there is no signs of active infection at this time. Patient has been tolerating the compression wrap without complication and his foot swelling in particular appears to be greatly improved. He does still have a wound on the lateral portion of his left leg I believe this is more of a blister that has now reopened. 01/03/2019 on evaluation today patient actually appears to be doing excellent in regard to his left lower extremity. He did receive his compression pumps and is actually use this 7 times since he was last here in the office. On top of the compression wrap he is now roughly 3 cm better at the calf and 2 cm better at the ankle he also states that his foot seem to go an issue better without even having to use a shoe horn. Obviously I  think this is all evidence that he is doing excellent in this regard. The other good news is he does not appear to have anything open today as far as wounds are concerned. 01/15/2019 on evaluation today patient appears to be doing more poorly yet again with regard to his left lower extremity. He has developed new wounds again after being discharged just recently. Unfortunately this continues to be the case that he will heal and then have subsequent new wounds. The last time I was hopeful that he may not end up coming back too quickly especially since he states he has been using his lymphedema pumps along with wearing his compression. Nonetheless he had a blister on the back of his leg that popped up on the left and this has opened up into an ulceration it is quite painful. 01/22/19 on evaluation today patient  actually appears to be doing well with regard to his wound on the left lower extremity. He's been tolerating the dressing changes without complication including the compression wrap in the wound appears to be significantly smaller today which is great news. Overall very pleased in this regard. 01/29/2019 on evaluation today patient appears to be doing well with regard to his left posterior lower extremity ulcer. He has been tolerating the dressing changes without complication. This is not completely healed but is getting much closer. We did order a Farrow wrap 4000 for him he has received this and has it with him today although I am not sure we are quite ready to start him on that as of yet. We are very close. 02/05/2019 on evaluation today patient actually appears to be doing quite well with regard to his left posterior lower extremity ulcer. He still has a very tiny opening remaining but the fortunate thing is he seems to be healing quite nicely. He also did get his Farrow wrap which I am hoping will help with his edema control as well at home. Fortunately there is no evidence of active infection. 02/12/2019 patient and fortunately appears to be doing poorly in regard to his wounds of the left lower extremity. He was very close to healing therefore we attempted to use his Velcro compression wraps continuing with lymphedema pumps at home. Unfortunately that does not seem to have done very well for him. He tells me that he wore them all the time but again I am not sure why if that is the case that he is having such significant edema. He is still on his fluid pills as well. With that being said there is no obvious sign of infection although I do wonder about the possibility of infection at this time as well. 02/19/2019 unfortunately upon evaluation today patient appears to be doing more poorly with regard to his left lower extremity. He is not showing signs of significant improvement and I think the  biggest issue here is that he does have an infection that appears to likely be Pseudomonas. That is based on the blue-green drainage that were noted at this time. Unfortunately the antibiotic that has been on is not going to take care of this at all. I think they will get a need to switch him to either Levaquin or Cipro and this was discussed with the patient. 02/26/2019 on evaluation today patient's lower extremity on the left appears to be doing significantly better as compared to last evaluation. Fortunately there is no signs of active infection at this time. He has been tolerating the compression wrap without complication in  fact he made it the whole week at this point. He is showing signs of excellent improvement I am very happy in this regard. With that being said he is having some issues with infection we did review the results of his culture which I noted today. He did have a positive finding for Enterobacter as well as Alcaligenes faecalis. Fortunately the Levaquin that I placed him on will work for both which is great news. There is no signs of systemic infection at this point. 10/30; left posterior leg wound in the setting of very significant edema and what looks like chronic venous inflammation. He has compression pumps but does not use them. We have been using 3 layer compression. Silver alginate to the wound as the primary dressing 03/18/2019 on evaluation today patient appears to be doing a little better compared to last time I saw him. He really has not been using his compression pumps he tells me that he is having too much discomfort. He has been keeping his wraps on however. He is only been taking his fluid pills every other day because he states they are not really helping and he has an appointment with his primary care provider at the [facility] tomorrow. Subsequently the wound itself on the left lower extremity does seem to be greatly improved compared to previous. 03/25/2019 on evaluation  today patient appears to be doing better with regard to his wounds on the bilateral lower extremities. The left is doing excellent the right is also doing better although both still do show some signs of open wounds noted at this point unfortunately. Fortunately there is no signs of active infection at this time. The patient also is not really having any significant pain which is good news. Unfortunately there was some confusion with the referral on vascular disease and as far as getting the patient scheduled there can be contacting him later today to do this MONTOYA, BRANDEL ([id number]) fortunately we got this straightened out. 04/01/2019 on evaluation today patient appears to be doing no fevers, chills, nausea, vomiting, or diarrhea. Excellent at this time with regard to his lower extremities. There does not appear to be any open wound at this point which is good news. Fortunately is also no signs of active infection at this time. Overall feel like the patient has done excellent with the compression the problem is every time we got him to this point and then subsequently go to using his own compression things just go right back to where they were. I am not sure how to address this we can try to get an appointment with vascular for 2 weeks now they have yet to call him. Obviously this has become frustrating for the patient as well. I think the issue has just been an honest error as far as scheduling is concerned but nonetheless still worn out the point where I am unsure of which direction we should take. 04/08/2019 on evaluation today patient actually appears to be doing well with regard to his lower extremities. There are no open wounds at this time and things seem to be managing quite nicely as far as the overall edema control is concerned. With that being said he does have his compression socks today for [place] to go ahead and reinitiate therapy in that manner at this point. He is going to be going for  shoes to be measured on Wednesday and then coincidentally he will also be seeing vascular on Thursday. Overall I think this is good news and  again I am hopeful that they will be able to do something for him to help prevent ongoing issues with edema control as well. No fevers, chills, nausea, vomiting, or diarrhea. 04/11/2019 on evaluation today patient actually appears to be doing poorly after just being discharged on Monday of this week. He had been experiencing issues with again blisters especially on the left lower extremity. With that being said he was completely healed and appeared to be doing great this past Monday. He then subsequently has new blisters that formed before his appointment with vascular this morning. He was also measured for shoes in the interim. With that being said we may have figured out what exactly is going on and why he continues to have issues like what we are seeing at this point. He takes his compression stockings off at nighttime and then he ends up having to sleep in his chair for 5-6 hours a night. He sleeps with his feet down he cannot really get him up in the recliner and therefore he is sleeping and the worst possible his position with his feet on the floor for that majority of the time. Again as I explained to him that is about one third at minimum at least one fourth of his day that he spending with his feet dangling down on the ground and the worst possible position they could be. I think this may be what is causing the issue. Subsequently I am leaning toward thinking that he may need a hospital bed in order to elevate his legs. We likely can have to coordinate this with his primary care provider at the [facility]. Readmission: 01/26/2021 this is a patient who presents for repeat evaluation here in the clinic although it is actually been couple of years since have seen him in fact it was December 2020 when I last saw him. Subsequently he never really healed but  did end up being lost to follow-up. He tells me has been having issues ongoing with his lower extremities has bilateral lower extremity lymphedema no real significant or definitive open wounds but in general his lymphedema is way out of control. We were never able to refer him to lymphedema clinic simply due to the fact to be honest we were never able to get him completely healed. I do not see anyone with open wounds. The patient does have evidence of type 2 diabetes mellitus, lymphedema, chronic venous insufficiency, and hypertension. That really has not changed since his last evaluation. 02/09/2021 upon evaluation today patient appears to be doing a little better in regard to his legs although he still having a tremendous amount of drainage especially on the left leg. Fortunately there does not appear to be any evidence of active infection. Of note when we looked into this further it appears that the patient did not have any absorptive dressing on it was just the 4-layer compression wrap. Nonetheless this is probably big part of the issue here. 10/10; he comes in today with 3 large areas on the upper right lower leg likely remanence of denuded blistering under his compression wraps. He has no other wounds on the right. On the left he has the denuded area on the left medial foot and ankle and on the left dorsal foot. Massive lymphedema in both feet dorsally. Using Zetuvit under compression We have increased home health visitation to twice a week to change the dressings and will change it once 02/22/2021 upon evaluation today patient appears to be doing well currently  with regard to his wounds. He has been tolerating the dressing changes without complication. Fortunately there does not appear to be any evidence of active infection which is great news. No fevers, chills, nausea, vomiting, or diarrhea. The biggest issue I see currently is that home health is not putting any medicine on the actual wounds  before wrapping. 03/01/2021 upon evaluation today the patient's right leg actually appears to be doing quite well which is great news there does not appear to be any evidence of active infection at this time. No fevers, chills, nausea, vomiting, or diarrhea. With that being said the patient is having issues on the left foot where he is having significant drainage is also an ammonia smell he does not have any animals at home and this makes me concerned about a bacteria producing urea as a byproduct. Again the possible common organisms will be E. coli, Proteus, and Enterococcus. All 3 of which can be successfully treated with Levaquin. For that reason I think that this may be a good option for [place] to consider placing him on and I did obtain a culture as well for confirmation sake. 03/08/2021 upon evaluation today patient appears to be doing unfortunately still somewhat poorly in regard to his leg ulcerations. He actually has an area on the right leg where he blistered due to the fact that his wrap slid down and caused an area of pinching on his skin and this has led to a significant issue here. 03/15/2021 upon evaluation today patient unfortunately has not been wrapped appropriately with absorptive dressings nor with the appropriate technique for the third layer of the 4-layer compression wrap. These are issues that we continue to try to address with the home health nurse. Also the absorptive dressing that she had was cut in half and therefore that causes things to leak out it does not actually trap the fluid in regard to the top of the foot overall I think that all these combined are really not seeing things improved significantly here. Fortunately there does not appear to be any signs of significant infection at this time which is good news. He still is having a tremendous amount of drainage. 03/22/2021 upon evaluation today patient appears to be draining tremendously. He still continues to tell me  that he is using his pumps 2 times a day and that coupled with that tells me that he is elevating his legs as well. With that being said all things considered I am really just not seeing the improvement we would expect to see with the 4-layer compression wrap and all the above noted. He in fact had an extremely large Zetuvit dressing on both legs and that they were extremely filled to the max with fluid. This is after just being changed just before the weekend and this is Monday. Nonetheless I am concerned about the fact that there is something going on fairly significant that we cannot get any of this under control and that he is draining this significantly. He supposed be having an echocardiogram it sounds like scheduling has been an issue for him as far as getting in sooner. Its something to do with needing his cousin to drive him because of where it sat and he cannot drive himself to this appointment either way I really think he needs to try to see what he can do about making this happen a little sooner. He tells me he will call today. [name], [name] ([id number]) 03/29/2021 on evaluation today patient appears to be  doing about the same in regard to his legs. He did get his cardiology appointment moved up to 6 December which is at least good that is better than what it was before mid December. Overall very pleased in that regard. 04/05/2021 upon evaluation today patient unfortunately is still doing fairly poorly. There does not appear to be any signs of active infection at this time. No fevers, chills, nausea, vomiting, or diarrhea. Unfortunately I think until his edema is under control and overall fluid overload there is really not to be much chance that I can do much to get him better. This is quite unfortunate and frustrating both for myself and the patient to be perfectly honest. Nonetheless I think that he really needs to have a conversation both with his primary care provider as well as  cardiologist he sees the PCP on Monday and cardiology on Wednesday of next week. 04/13/2021 upon evaluation today patient appears to be doing poorly in regard to his bilateral lower extremities his left is still worse than the right. With that being said he has a tremendous amount of drainage he did see his primary care provider yesterday there really was not much there to be done from their perspective. He sees cardiology tomorrow. Nonetheless my biggest concern here is simply that if we do not get the edema under control he is going to continue to have drainage and honestly I think at some point he is going to become infected severely that is my main concern. 04/19/2021 upon evaluation today patient appears to be doing poorly still in regard to his legs. Unfortunately there does not appear to be any signs of infection at this point. He does have a tremendous amount of drainage however. We have not seen the results back from the cardiologist and the echocardiogram that was done. It appears that the patient checked out okay as far as that is concerned with regard to ejection fraction though we still have some issues here to be honest with his diastolic function. I am unsure if this is accounting for everything that we are seeing or not. Either way he has a tremendous amount of drainage from his legs that we are just not able to control in the outpatient setting at this point. I have reached out to Dr. Rockey Situ his cardiologist to see once he reviews the sheet if there is anything that he feels like can be done from an outpatient perspective if not then I think the way to go is probably can to be through inpatient admission and diuresis. Otherwise I am not sure how working to get this under control we tried antibiotics, compression wrapping, and I have told the patient to be elevating his legs I am not sure how much he does of this but either way I think that this is still an ongoing issue  nonetheless. 04/26/2021 upon evaluation today patient appears to be doing poorly in regard to his legs. He is having a tremendous amount of fluid at this point which is quite unfortunate. Its to the point that he may have had at least 5 to 10 pounds of fluid in his dressings this morning when they were removed these were changed this Friday. Subsequently I think he needs to go to the ER for further evaluation and treatment I think is probably can need diuresis possibly even IV antibiotics been on what the blood work looks like but in general I feel like he needs something to get this under control from  an outpatient perspective absent of everywhere I can think of and I cannot get this under control with our traditional measures. I think this is going require more so that we can get him better 12/30; this is a patient with severe bilateral lymphedema. He was hospitalized from 04/26/2021 through 04/29/2021 treated for cellulitis in the setting of lower extremity ulcers and lymphedema. After he left the hospital he is apparently seen for nurse visit our staff contacted cardiology and he has been started on Lasix 40 mg. Apparently his legs have less edema. Lab work from 05/04/2021 showed a BUN of 38 and creatinine of 1.59 these are elevated versus previous where his creatinine seems to have been 1.30 on 12/19 his potassium is 4.3. I believe the lab work is being followed by cardiology We have him in a 4-layer wrap. Xeroform on the leg wounds and sit to fit on the Berry damage skin on the left dorsal foot versus right dorsal foot. He has compression pumps but does not use them. We have apparently not yet ordered him compression stockings 05/17/2021 upon evaluation today patient's legs though better than last time I personally saw him appear to be getting worse compared to where they were previous. Dr. Quentin Cornwall was actually last 1 to see you I have not seen him since 19 December. That was before he went into  the ER. Coming out apparently his legs looked also and they still look better but not as good as they were in the past. 1/16; patient with severe bilateral lymphedema. Severe scaled hyperkeratotic skin on the dorsal aspect of his distal left foot and left medial ankle.. On the right side changes are not as bad. He did not have any weeping edema. Our intake nurse was convinced that he is being compliant with compression pumps 1 hour twice a day 05/31/2021 upon evaluation today patient actually appears to be doing a little bit better in my opinion in regard to his feet. I do not see as much drainage and it being just completely wet as it was previous. Fortunately I do not also see any signs of active infection which is great news as well. 06/07/21 Upon inspection patient's wound bed actually showed signs of doing well he is not nearly as weepy and wet as he has been in the past and overall very pleased in that regard. Fortunately I do not see any signs of active infection locally or nor systemically at this time. Which is great news. No fevers, chills, nausea, vomiting, or diarrhea. 06/14/2021 upon evaluation today patient appears to be doing well with regard to his right foot I am pleased in that regard. His left foot is still draining quite a bit despite using lymphedema pumps, 4-layer compression wraps, and he tells me elevating his legs as well. He also has Lasix that he takes twice a day. Nonetheless I believe that this is still good to be an ongoing issue. We have a hard time getting this under control as far as the swelling is concerned. 06/21/2021 upon evaluation today patient appears to be doing decently well in regard to his wounds all things considered. He still has a tremendous amount of drainage and fluid noted at this point. Fortunately I do not see any signs of active infection locally or systemically at this point which is great news. Nonetheless I am unsure where to go and how to do this  as far as trying to limit his swelling and weeping from his toes in particular.  06/28/2021 upon evaluation patient unfortunately continues to have significant drainage from his feet. We have been keeping him in a compression wrap and despite this he still continues to have extreme fluid issues he seen his cardiologist he is seeing the nephrologist. We really cannot find any way to get this under control when he did well was when he was in the hospital and they got some of the fluid away. But outside of that we are just struggling to achieve the long-term goal of getting this under control and keeping it under control unfortunately. 07/05/2021 upon evaluation today patient appears to be doing poorly in regard to his feet. Unfortunately this continues to be a significant issue and to be honest I am really not certain what to do about it. I referred him to Dr. Randol Kern at [facility] and he does have an appointment although it is 12 April. He also sees his primary care provider on 6 April. He did not want to see Dr. Haynes Kerns until after he saw his PCP that is the reason the appointment so far out. There is really not much I can do in that regard. Nonetheless I do think that we are still continue to have significant lymphedema issues with significant mount of weeping in regard to the feet and again this has just become extremely difficult to manage to be honest I am not sure if there is something else that Dr. Haynes Kerns or someone else could recommend he also will be seeing Dr. Dellia Nims in 2 weeks when I am on vacation and at that time I will see if Dr. Dellia Nims has any ideas about where to go from here in the meantime. [name], [name] ([id number]) 07/12/2021 upon evaluation today patient appears to be doing about as well as can be expected with regard to his feet. He does actually see his kidney doctor this Friday. He also will be seeing his primary care provider on April 4 and then following that around mid April he will be  seeing Dr. Haynes Kerns at [facility] which was a referral made for him. Again my goal is to try to find out some way to fix this and to be perfectly honest we have had some issues with making any good adjustments. When he was in the hospital and greater amounts of Lasix he was able to get this down and it looked much better upon discharge. With that being said right now things just are not doing nearly as good as what they used to be. 3/13; patient presents for follow-up. He has been using his lymphedema pumps over the past week. He reports an increase in his Lasix dose. He has no issues or complaints today. He denies signs of infection. 07/26/2021 upon evaluation today patient appears to be doing better in regard to his feet bilaterally. Both are showing signs of much less drainage which is great news and overall very pleased in that regard. Fortunately there does not appear to be any evidence of active infection locally or systemically at this time. No fevers, chills, nausea, vomiting, or diarrhea. 08/02/2021 upon evaluation today patient appears to be doing well with regard to his feet. Both are showing signs of being drier the right pretty much has not really draining much at all which is great news. The left is not draining anywhere close to his much as it was during the last evaluation. This is excellent news and overall very pleased. 08/09/2021 upon evaluation today patient appears to be doing well with regard to  his legs the right leg especially showing signs of excellent improvement which is great news I do not see any evidence of active infection locally or systemically which is great. In regard to the left leg he still has some weeping and drainage but nothing as significant as what it was in the past this is great news. 08-16-2021 upon evaluation today patient appears to actually be doing quite well in my opinion in regard to his feet. This is significantly improved compared to what we previously seen  and overall I am extremely pleased in that regard. I do believe that He is actually improving although this is obviously very slow going. 08-23-2021 upon evaluation today patient appears to be doing well currently in regard to his right leg which actually is pretty dry at this point today. Fortunately I do not see any evidence of active infection at this time which is great news. No fevers, chills, nausea, vomiting, or diarrhea. 08-30-2021 upon evaluation today patient appears to be doing well with regard to his lower extremities. The right foot is pretty much completely dry which is great news the left foot though not completely dry seems to be doing decently well. I do not see any signs of active infection locally or systemically which is great news. No fevers, chills, nausea, vomiting, or diarrhea. 09-06-2021 upon evaluation today patient appears to be doing well with regard to his feet in fact now the left foot is almost completely dry as well and I am definitely seeing a lot of significant improvement. With that being said unfortunately he actually appears to have some cellulitis of his right thigh. His toes are also little bit red but this may just be due to the increased swelling. He really is not warm to touch in regard to the toes. 5/8; excellent edema control on the right foot and lower leg there is no open wounds but we continue to put compression on this otherwise this will breakdown. He is using his compression pumps twice a day at home The area that is problematic is on the left dorsal foot some areas that are not fully epithelialized with very dry fissured skin over this area. 09-20-2021 upon evaluation today patient appears to be doing a little bit worse in regard to swelling at this time. Fortunately I do not see any signs of infection with that being said the wrap was not on quite as well as what I would like to have seen. I do believe that this has caused a little bit of excess swelling  and again we need to try to get this under good control. 09-27-2021 upon evaluation today patient appears to be doing awesome in regard to his feet and legs. Everything is measuring smaller the swelling is down and to be perfectly honest I am extremely pleased with the end of his feet especially on the left side and how dry this is today. I do think we are on the right track here. 5/31; patient presents for follow-up. He is using nystatin powder to the feet bilaterally under 4-layer compression. He has no issues or complaints today. He states he is going to the lymphedema clinic tomorrow. Since he has no open wounds on the right lower extremity they will be focused on the side. 10-11-2021 upon evaluation today patient appears to be doing excellent in regard to his legs. He has been tolerating the dressing changes without complication. Fortunately there does not appear to be any evidence of active infection locally or  systemically at this time which is great news. No fevers, chills, nausea, vomiting, or diarrhea. 10-18-2021 upon evaluation today patient appears to be doing well with regard to his legs. He has been tolerating the dressing changes without complication and actually seen in lymphedema clinic now in regard to his right leg were taken care of the left leg. Fortunately I do not see any evidence of active infection locally or systemically at this time. 10-26-2021 upon evaluation today patient's wounds actually are showing signs of doing well in fact he really does not have wounds as much is weeping of the lower extremities. Fortunately I do not see any evidence of active infection locally or systemically which is great news. No fever or chills noted 11-02-2021 upon evaluation today patient appears to be doing decently well in regard to his legs. Of actually been on the phone quite a bit discussing with [facility] orthotics custom compression for him. I also have been discussing everything with his  lymphedema clinic provider as well. Helene Kelp notes that there is really not much more that she can do for him which was noted last week as well. Subsequently I do believe that the patient would benefit from getting custom compression in fact I think that is what he is going require to keep anything under control here. He voiced understanding. 11-08-2021 upon evaluation today patient appears to be doing excellent in regard to his legs and feet. Fortunately I do not see any signs of infection at this time which is great news. No fever or chills noted again I am still been working on trying to figure out what exactly we need to do as far as getting the [place] involved in coverage for custom compression for this patient. I also discussed with him that he does need to have custom shoes which I completely understand. With that being said it does make it difficult thinking about what kind of combination of shoes and compression is going to be best for him for the long-term. Fortunately I do not see any evidence of active infection locally or systemically which is great news. Overall I think you are doing quite well. [name], [name] ([id number]) Electronic Signature(s) Signed: 11/08/2021 2:50:58 PM By: Worthy Keeler PA-C Entered By: Worthy Keeler on 11/08/2021 14:50:58 [name] ([id number]) -------------------------------------------------------------------------------- Physical Exam Details Patient Name: [name] Date of Service: 11/08/2021 2:00 PM Medical Record Number: [id number] Patient Account Number: [id number] Date of Birth/Sex: [birth year] (71 y.o. M) Treating RN: [name] Primary Care Provider: Fontaine No Other Clinician: Referring Provider: Fontaine No Treating Provider/Extender: Skipper Cliche in Treatment: 71 Constitutional Well-nourished and well-hydrated in no acute distress. Respiratory normal breathing without difficulty. Psychiatric this patient is able to  make decisions and demonstrates good insight into disease process. Alert and Oriented x 3. pleasant and cooperative. Notes Upon inspection patient's legs actually appear to be doing quite well. I do not see any signs of infection and overall I think that we are actually headed in the right direction here. Electronic Signature(s) Signed: 11/08/2021 2:51:17 PM By: Worthy Keeler PA-C Entered By: Worthy Keeler on 11/08/2021 14:51:17 [name] ([id number]) -------------------------------------------------------------------------------- Physician Orders Details Patient Name: [name] Date of Service: 11/08/2021 2:00 PM Medical Record Number: [id number] Patient Account Number: [id number] Date of Birth/Sex: [birth year] (71 y.o. M) Treating RN: [name] Primary Care Provider: Fontaine No Other Clinician: Referring Provider: Fontaine No Treating Provider/Extender: Skipper Cliche in Treatment: 39 Verbal / Phone Orders: No Diagnosis Coding ICD-10 Coding  Code Description E11.622 Type 2 diabetes mellitus with other skin ulcer I89.0 Lymphedema, not elsewhere classified I87.2 Venous insufficiency (chronic) (peripheral) L97.822 Non-pressure chronic ulcer of other part of left lower leg with fat layer exposed L97.512 Non-pressure chronic ulcer of other part of right foot with fat layer exposed I10 Essential (primary) hypertension L97.811 Non-pressure chronic ulcer of other part of right lower leg limited to breakdown of skin Follow-up Appointments o Return Appointment in 1 week. o Nurse Visit as needed o Other: Bathing/ Shower/ Hygiene o May shower with wound dressing protected with water repellent cover or cast protector. o No tub bath. Edema Control - Lymphedema / Segmental Compressive Device / Other Bilateral Lower Extremities o Optional: One layer of unna paste to top of compression wrap (to act as an anchor). - Unna paste on calf to secure wrap in  place as needed o 4 Layer Compression System Lymphedema. - bi lat, nystatin powder between toes , 4x4 gauze between toes , ABD to cover toes; zetuvit over top of foot, charcoal pad , tubi grip C to foot and ankle double layer COTTON LAYER SPIRAL, LIGHT TAN SPIRAL. WHITE WITH YELLOW LINE FIGURE 8 , COBAN SPIRAL o Tubigrip double layer applied - double size C foot ankles o Elevate, Exercise Daily and Avoid Standing for Long Periods of Time. o Elevate legs to the level of the heart and pump ankles as often as possible o Elevate leg(s) parallel to the floor when sitting. o Compression Pump: Use compression pump on left lower extremity for 60 minutes, twice daily. - 2 times per day o DO YOUR BEST to sleep in the bed at night. DO NOT sleep in your recliner. Long hours of sitting in a recliner leads to swelling of the legs and/or potential wounds on your backside. o Other: - Contact prescriber regarding use of diuretics to reduce fluid overload. Off-Loading o Turn and reposition every 2 hours Additional Orders / Instructions o Follow Nutritious Diet and Increase Protein Intake Electronic Signature(s) Signed: 11/08/2021 3:51:56 PM By: Worthy Keeler PA-C Signed: 11/08/2021 4:23:03 PM By: [name] RN Entered By: [name] on 11/08/2021 14:45:51 [name] ([id number]) -------------------------------------------------------------------------------- Problem List Details Patient Name: [name] Date of Service: 11/08/2021 2:00 PM Medical Record Number: [id number] Patient Account Number: [id number] Date of Birth/Sex: Sep 18, 1950 (71 y.o. M) Treating RN: [name] Primary Care Provider: Fontaine No Other Clinician: Referring Provider: Fontaine No Treating Provider/Extender: Skipper Cliche in Treatment: 32 Active Problems ICD-10 Encounter Code Description Active Date MDM Diagnosis E11.622 Type 2 diabetes mellitus with other skin ulcer 01/26/2021  No Yes I89.0 Lymphedema, not elsewhere classified 01/26/2021 No Yes I87.2 Venous insufficiency (chronic) (peripheral) 01/26/2021 No Yes L97.822 Non-pressure chronic ulcer of other part of left lower leg with fat layer 01/26/2021 No Yes exposed L97.512 Non-pressure chronic ulcer of other part of right foot with fat layer 01/26/2021 No Yes exposed I10 Essential (primary) hypertension 01/26/2021 No Yes L97.811 Non-pressure chronic ulcer of other part of right lower leg limited to 02/15/2021 No Yes breakdown of skin Inactive Problems Resolved Problems Electronic Signature(s) Signed: 11/08/2021 1:58:16 PM By: Worthy Keeler PA-C Entered By: Worthy Keeler on 11/08/2021 13:58:16 [name] ([id number]) -------------------------------------------------------------------------------- Progress Note Details Patient Name: [name] Date of Service: 11/08/2021 2:00 PM Medical Record Number: [id number] Patient Account Number: [id number] Date of Birth/Sex: 1951-04-02 (71 y.o. M) Treating RN: [name] Primary Care Provider: Fontaine No Other Clinician: Referring Provider: Fontaine No Treating Provider/Extender: Skipper Cliche in Treatment: [phone]  Subjective Chief Complaint Information obtained from Patient Left LE ulcers History of Present Illness (HPI) 71 year old male who presented to the ER with bilateral lower extremity blisters which had started last week. he has a past medical history of leukemia, diabetes mellitus, hypertension, edema of both lower extremities, his recurrent skin infections, peripheral vascular disease, coronary artery disease, congestive heart failure and peripheral neuropathy. in the ER he was given Rocephin and put on Silvadene cream. he was put on oral doxycycline and was asked to follow-up with the [facility]. His last hemoglobin A1c was 6.6 in December and he checks his blood sugar once a week. He does not have any physicians outside the  [place] system. He does not recall any vascular duplex studies done either for arterial or venous disease but was told to wear compression stockings which he does not use 05/30/2016 -- we have not yet received any of his notes from the [facility] hospital system and his arterial and venous duplex studies are scheduled here in [place] around mid February. We are unable to have his insurance accepted by home health agencies and hence he is getting dressings only once a week. 06/06/16 -- -- I received a call from the patient's PCP at the [facility] at [facility] and spoke to Dr. Garvin Fila, phone number [phone] and fax number [phone]. She confirmed that no vascular testing was done over the last 5 years and she would be happy to do them if the patient did want them to be done at the [place] and we could fax him a request. Readmission: 71 year old male seen by as in February of this year and was referred to vein and vascular for studies and opinion from the vascular surgeons. The patient returns today with a fresh problem having had blisters on his left lower extremity which have been there for about 5 days and he clearly states that he has been wearing his compression stockings as advised though he could not read the moderate compression and has been wearing light compression. Review of his electronic medical records note that he had lower extremity arterial duplex examination done on 06/23/2016 which showed no hemodynamically significant stenosis in the bilateral lower extremity arterial system. He also had a lower extremity venous reflux examination done on 07/07/2016 and it was noted that he had venous incompetence in the right great saphenous vein and bilateral common femoral veins. Patient was seen by Dr. Tamala Julian on the same day and for some reason his notes do not reflect the venous studies or the arterial studies and he recommended patient do a venous duplex ultrasound to look for reflux and return to see  him.he would also consider a lymph pump if required. The patient was told that his workup was normal and hence the patient canceled his follow-up appointment. 02/03/17 on evaluation today patient left medial lower extremity blister appears to be doing about the same. It is still continuing to drain and there's still the blistered skin covering the wound bed which is making it difficult for the alternate to do its job. Fortunately there is no evidence of cellulitis. No fevers chills noted. Patient states in general he is not having any significant discomfort. Patient's lower extremity arterial duplex exam revealed that patient was hemodynamically stable with no evidence of stenosis in regard to the bilateral lower extremities. The lower extremity venous reflux exam revealed the patient had venous incontinence noted in the right greater saphenous and bilateral common femoral vein. There is no evidence of deep or  superficial vein thrombosis in the bilateral lower extremities. Readmission: 11/12/18 Patient presents for evaluation our clinic today concerning issues that he is having with his left lower extremity. He tells me that a couple weeks ago he began developing blisters on the left lower extremity along with increased swelling. He typically wears his compression stockings on a regular basis is previously been evaluated both here as well is with vascular surgery they would recommend lymphedema pumps but unfortunately that somehow fell through and he never heard anything back from that. Nonetheless I think lymphedema pumps would be beneficial for this patient. He does have a history of hypertension and diabetes. Obviously the chronic venous stasis and lymphedema as well. At this point the blisters have been given in more trouble he states sometimes when the blisters openings able to clean it down with alcohol and it will dry out and do well. Unfortunately that has not been the case this time. He is  having some discomfort although this mean these with cleaning the areas he doesn't have discomfort just on a regular basis. He has not been able to wear his compression stockings since the blisters arose due to the fact that of course it will drain into the socks causing additional issues and he didn't have any way to wrap this otherwise. He has increased to taking his Lasix every day instead of every other day. He sees his primary care provider later this month as well. No fevers, chills, nausea, or vomiting noted at this time. 11/19/18-Patient returns at 1 week, per intake RN the amount of seepage into the compression wraps was definitely improved, overall all the wounds are measuring smaller but continuing silver alginate to the wounds as primary dressing 11/26/18 on evaluation today patient appears to be doing quite well in regard to his left lower [place] ulcers. In fact of the areas that were noted initially he only has two regions still open. There is no evidence of active infection at this time. He still is not heard anything from the company regarding lymphedema pumps as of yet. Again as previously seen vascular they have not recommended any surgical intervention. [name], [name] ([id number]) 12/03/2018 on evaluation today patient actually appears to be doing quite well with regard to his lower extremity ulcers. In fact most of the areas appear to be healed the one spot which does not seem to be completely healed I am unsure of whether or not this is really draining that much but nonetheless there does not appear to be any signs of infection or significant drainage at this point. There is no sign of fever, chills, nausea, vomiting, or diarrhea. Overall I am pleased with how things have progressed I think is very close to being able to transition to his home compression stockings. 12/10/2018 upon evaluation today patient appears to be doing quite well with regard to his left lower extremity. He has  been tolerating the dressing changes without complication. Fortunately there is no signs of active infection at this time. He appears after thorough evaluation of his leg to only have 1 small area that remains open at this point everything else appears to be almost completely closed. He still have significant swelling of the left lower extremity. We had discussed discussing this with his primary care provider he is not able to see her in person they were at the [facility] and right now the [place] is not seeing patients on site. According to the patient anyway. Subsequently he did speak with her  apparently and his primary care provider feels that he may likely have a DVT. With that being said she has not seen his leg she is just going off of his history. Nonetheless that is a concern that the patient now has as well and while I do not feel the DVT is likely we can definitely ensure that that is not the case I will go ahead and see about putting that order in today. Nonetheless otherwise I am in a recommend that we continue with the current wound care measures including the compression therapy most likely. We just need to ensure that his leg is indeed free of any DVTs. 12/17/2018 on evaluation today patient actually appears to be completely healed today. He does have 2 very small areas of blistering although this is not anything too significant at this point which is good news. With that being said I am in agreement with the fact that I think he is completely healed at this point. He does want to get back into his compression stocking. The good news is we have gotten approval from insurance for his lymphedema pumps we received a letter since last saw him last week. The other good news is his study did come back and showed no evidence of a DVT. 12/20/2018 on evaluation today patient presents for follow-up concerning his ongoing issues with his left lower extremity. He was actually discharged last Friday and  did fairly well until he states blisters opened this morning. He tells me he has been wearing his compression stocking although he has a hard time getting this on. There does not appear to be any signs of active infection at this time. No fevers, chills, nausea, vomiting, or diarrhea. 12/27/2018 on evaluation today patient appears to be doing very well with regard to his swelling of the left lower extremity the 4 layer compression wrap seems to have been beneficial for him. Fortunately there is no signs of active infection at this time. Patient has been tolerating the compression wrap without complication and his foot swelling in particular appears to be greatly improved. He does still have a wound on the lateral portion of his left leg I believe this is more of a blister that has now reopened. 01/03/2019 on evaluation today patient actually appears to be doing excellent in regard to his left lower extremity. He did receive his compression pumps and is actually use this 7 times since he was last here in the office. On top of the compression wrap he is now roughly 3 cm better at the calf and 2 cm better at the ankle he also states that his foot seem to go an issue better without even having to use a shoe horn. Obviously I think this is all evidence that he is doing excellent in this regard. The other good news is he does not appear to have anything open today as far as wounds are concerned. 01/15/2019 on evaluation today patient appears to be doing more poorly yet again with regard to his left lower extremity. He has developed new wounds again after being discharged just recently. Unfortunately this continues to be the case that he will heal and then have subsequent new wounds. The last time I was hopeful that he may not end up coming back too quickly especially since he states he has been using his lymphedema pumps along with wearing his compression. Nonetheless he had a blister on the back of his leg  that popped up on the left and  this has opened up into an ulceration it is quite painful. 01/22/19 on evaluation today patient actually appears to be doing well with regard to his wound on the left lower extremity. He's been tolerating the dressing changes without complication including the compression wrap in the wound appears to be significantly smaller today which is great news. Overall very pleased in this regard. 01/29/2019 on evaluation today patient appears to be doing well with regard to his left posterior lower extremity ulcer. He has been tolerating the dressing changes without complication. This is not completely healed but is getting much closer. We did order a Farrow wrap 4000 for him he has received this and has it with him today although I am not sure we are quite ready to start him on that as of yet. We are very close. 02/05/2019 on evaluation today patient actually appears to be doing quite well with regard to his left posterior lower extremity ulcer. He still has a very tiny opening remaining but the fortunate thing is he seems to be healing quite nicely. He also did get his Farrow wrap which I am hoping will help with his edema control as well at home. Fortunately there is no evidence of active infection. 02/12/2019 patient and fortunately appears to be doing poorly in regard to his wounds of the left lower extremity. He was very close to healing therefore we attempted to use his Velcro compression wraps continuing with lymphedema pumps at home. Unfortunately that does not seem to have done very well for him. He tells me that he wore them all the time but again I am not sure why if that is the case that he is having such significant edema. He is still on his fluid pills as well. With that being said there is no obvious sign of infection although I do wonder about the possibility of infection at this time as well. 02/19/2019 unfortunately upon evaluation today patient appears to be  doing more poorly with regard to his left lower extremity. He is not showing signs of significant improvement and I think the biggest issue here is that he does have an infection that appears to likely be Pseudomonas. That is based on the blue-green drainage that were noted at this time. Unfortunately the antibiotic that has been on is not going to take care of this at all. I think they will get a need to switch him to either Levaquin or Cipro and this was discussed with the patient. 02/26/2019 on evaluation today patient's lower extremity on the left appears to be doing significantly better as compared to last evaluation. Fortunately there is no signs of active infection at this time. He has been tolerating the compression wrap without complication in fact he made it the whole week at this point. He is showing signs of excellent improvement I am very happy in this regard. With that being said he is having some issues with infection we did review the results of his culture which I noted today. He did have a positive finding for Enterobacter as well as Alcaligenes faecalis. Fortunately the Levaquin that I placed him on will work for both which is great news. There is no signs of systemic infection at this point. 10/30; left posterior leg wound in the setting of very significant edema and what looks like chronic venous inflammation. He has compression pumps but does not use them. We have been using 3 layer compression. Silver alginate to the wound as the primary dressing 03/18/2019 on  evaluation today patient appears to be doing a little better compared to last time I saw him. He really has not been using his compression pumps he tells me that he is having too much discomfort. He has been keeping his wraps on however. He is only been taking his fluid pills every other day because he states they are not really helping and he has an appointment with his primary care provider at the [facility] tomorrow.  Subsequently the wound itself on the left lower extremity does seem to be greatly improved compared to previous. [name], [name] ([id number]) 03/25/2019 on evaluation today patient appears to be doing better with regard to his wounds on the bilateral lower extremities. The left is doing excellent the right is also doing better although both still do show some signs of open wounds noted at this point unfortunately. Fortunately there is no signs of active infection at this time. The patient also is not really having any significant pain which is good news. Unfortunately there was some confusion with the referral on vascular disease and as far as getting the patient scheduled there can be contacting him later today to do this fortunately we got this straightened out. 04/01/2019 on evaluation today patient appears to be doing no fevers, chills, nausea, vomiting, or diarrhea. Excellent at this time with regard to his lower extremities. There does not appear to be any open wound at this point which is good news. Fortunately is also no signs of active infection at this time. Overall feel like the patient has done excellent with the compression the problem is every time we got him to this point and then subsequently go to using his own compression things just go right back to where they were. I am not sure how to address this we can try to get an appointment with vascular for 2 weeks now they have yet to call him. Obviously this has become frustrating for the patient as well. I think the issue has just been an honest error as far as scheduling is concerned but nonetheless still worn out the point where I am unsure of which direction we should take. 04/08/2019 on evaluation today patient actually appears to be doing well with regard to his lower extremities. There are no open wounds at this time and things seem to be managing quite nicely as far as the overall edema control is concerned. With that being said he  does have his compression socks today for [place] to go ahead and reinitiate therapy in that manner at this point. He is going to be going for shoes to be measured on Wednesday and then coincidentally he will also be seeing vascular on Thursday. Overall I think this is good news and again I am hopeful that they will be able to do something for him to help prevent ongoing issues with edema control as well. No fevers, chills, nausea, vomiting, or diarrhea. 04/11/2019 on evaluation today patient actually appears to be doing poorly after just being discharged on Monday of this week. He had been experiencing issues with again blisters especially on the left lower extremity. With that being said he was completely healed and appeared to be doing great this past Monday. He then subsequently has new blisters that formed before his appointment with vascular this morning. He was also measured for shoes in the interim. With that being said we may have figured out what exactly is going on and why he continues to have issues like what we are seeing  at this point. He takes his compression stockings off at nighttime and then he ends up having to sleep in his chair for 5-6 hours a night. He sleeps with his feet down he cannot really get him up in the recliner and therefore he is sleeping and the worst possible his position with his feet on the floor for that majority of the time. Again as I explained to him that is about one third at minimum at least one fourth of his day that he spending with his feet dangling down on the ground and the worst possible position they could be. I think this may be what is causing the issue. Subsequently I am leaning toward thinking that he may need a hospital bed in order to elevate his legs. We likely can have to coordinate this with his primary care provider at the [facility]. Readmission: 01/26/2021 this is a patient who presents for repeat evaluation here in the clinic although it is  actually been couple of years since have seen him in fact it was December 2020 when I last saw him. Subsequently he never really healed but did end up being lost to follow-up. He tells me has been having issues ongoing with his lower extremities has bilateral lower extremity lymphedema no real significant or definitive open wounds but in general his lymphedema is way out of control. We were never able to refer him to lymphedema clinic simply due to the fact to be honest we were never able to get him completely healed. I do not see anyone with open wounds. The patient does have evidence of type 2 diabetes mellitus, lymphedema, chronic venous insufficiency, and hypertension. That really has not changed since his last evaluation. 02/09/2021 upon evaluation today patient appears to be doing a little better in regard to his legs although he still having a tremendous amount of drainage especially on the left leg. Fortunately there does not appear to be any evidence of active infection. Of note when we looked into this further it appears that the patient did not have any absorptive dressing on it was just the 4-layer compression wrap. Nonetheless this is probably big part of the issue here. 10/10; he comes in today with 3 large areas on the upper right lower leg likely remanence of denuded blistering under his compression wraps. He has no other wounds on the right. On the left he has the denuded area on the left medial foot and ankle and on the left dorsal foot. Massive lymphedema in both feet dorsally. Using Zetuvit under compression We have increased home health visitation to twice a week to change the dressings and will change it once 02/22/2021 upon evaluation today patient appears to be doing well currently with regard to his wounds. He has been tolerating the dressing changes without complication. Fortunately there does not appear to be any evidence of active infection which is great news. No fevers,  chills, nausea, vomiting, or diarrhea. The biggest issue I see currently is that home health is not putting any medicine on the actual wounds before wrapping. 03/01/2021 upon evaluation today the patient's right leg actually appears to be doing quite well which is great news there does not appear to be any evidence of active infection at this time. No fevers, chills, nausea, vomiting, or diarrhea. With that being said the patient is having issues on the left foot where he is having significant drainage is also an ammonia smell he does not have any animals at home and  this makes me concerned about a bacteria producing urea as a byproduct. Again the possible common organisms will be E. coli, Proteus, and Enterococcus. All 3 of which can be successfully treated with Levaquin. For that reason I think that this may be a good option for [place] to consider placing him on and I did obtain a culture as well for confirmation sake. 03/08/2021 upon evaluation today patient appears to be doing unfortunately still somewhat poorly in regard to his leg ulcerations. He actually has an area on the right leg where he blistered due to the fact that his wrap slid down and caused an area of pinching on his skin and this has led to a significant issue here. 03/15/2021 upon evaluation today patient unfortunately has not been wrapped appropriately with absorptive dressings nor with the appropriate technique for the third layer of the 4-layer compression wrap. These are issues that we continue to try to address with the home health nurse. Also the absorptive dressing that she had was cut in half and therefore that causes things to leak out it does not actually trap the fluid in regard to the top of the foot overall I think that all these combined are really not seeing things improved significantly here. Fortunately there does not appear to be any signs of significant infection at this time which is good news. He still is having a  tremendous amount of drainage. 03/22/2021 upon evaluation today patient appears to be draining tremendously. He still continues to tell me that he is using his pumps 2 times a day and that coupled with that tells me that he is elevating his legs as well. With that being said all things considered I am really just not seeing the improvement we would expect to see with the 4-layer compression wrap and all the above noted. He in fact had an extremely large Zetuvit dressing on both legs and that they were extremely filled to the max with fluid. This is after just being changed just before the weekend and this [name], [name] ([id number]) is Monday. Nonetheless I am concerned about the fact that there is something going on fairly significant that we cannot get any of this under control and that he is draining this significantly. He supposed be having an echocardiogram it sounds like scheduling has been an issue for him as far as getting in sooner. Its something to do with needing his cousin to drive him because of where it sat and he cannot drive himself to this appointment either way I really think he needs to try to see what he can do about making this happen a little sooner. He tells me he will call today. 03/29/2021 on evaluation today patient appears to be doing about the same in regard to his legs. He did get his cardiology appointment moved up to 6 December which is at least good that is better than what it was before mid December. Overall very pleased in that regard. 04/05/2021 upon evaluation today patient unfortunately is still doing fairly poorly. There does not appear to be any signs of active infection at this time. No fevers, chills, nausea, vomiting, or diarrhea. Unfortunately I think until his edema is under control and overall fluid overload there is really not to be much chance that I can do much to get him better. This is quite unfortunate and frustrating both for myself and the patient to  be perfectly honest. Nonetheless I think that he really needs to have a conversation both  with his primary care provider as well as cardiologist he sees the PCP on Monday and cardiology on Wednesday of next week. 04/13/2021 upon evaluation today patient appears to be doing poorly in regard to his bilateral lower extremities his left is still worse than the right. With that being said he has a tremendous amount of drainage he did see his primary care provider yesterday there really was not much there to be done from their perspective. He sees cardiology tomorrow. Nonetheless my biggest concern here is simply that if we do not get the edema under control he is going to continue to have drainage and honestly I think at some point he is going to become infected severely that is my main concern. 04/19/2021 upon evaluation today patient appears to be doing poorly still in regard to his legs. Unfortunately there does not appear to be any signs of infection at this point. He does have a tremendous amount of drainage however. We have not seen the results back from the cardiologist and the echocardiogram that was done. It appears that the patient checked out okay as far as that is concerned with regard to ejection fraction though we still have some issues here to be honest with his diastolic function. I am unsure if this is accounting for everything that we are seeing or not. Either way he has a tremendous amount of drainage from his legs that we are just not able to control in the outpatient setting at this point. I have reached out to Dr. Rockey Situ his cardiologist to see once he reviews the sheet if there is anything that he feels like can be done from an outpatient perspective if not then I think the way to go is probably can to be through inpatient admission and diuresis. Otherwise I am not sure how working to get this under control we tried antibiotics, compression wrapping, and I have told the patient to be  elevating his legs I am not sure how much he does of this but either way I think that this is still an ongoing issue nonetheless. 04/26/2021 upon evaluation today patient appears to be doing poorly in regard to his legs. He is having a tremendous amount of fluid at this point which is quite unfortunate. Its to the point that he may have had at least 5 to 10 pounds of fluid in his dressings this morning when they were removed these were changed this Friday. Subsequently I think he needs to go to the ER for further evaluation and treatment I think is probably can need diuresis possibly even IV antibiotics been on what the blood work looks like but in general I feel like he needs something to get this under control from an outpatient perspective absent of everywhere I can think of and I cannot get this under control with our traditional measures. I think this is going require more so that we can get him better 12/30; this is a patient with severe bilateral lymphedema. He was hospitalized from 04/26/2021 through 04/29/2021 treated for cellulitis in the setting of lower extremity ulcers and lymphedema. After he left the hospital he is apparently seen for nurse visit our staff contacted cardiology and he has been started on Lasix 40 mg. Apparently his legs have less edema. Lab work from 05/04/2021 showed a BUN of 38 and creatinine of 1.59 these are elevated versus previous where his creatinine seems to have been 1.30 on 12/19 his potassium is 4.3. I believe the lab work is  being followed by cardiology We have him in a 4-layer wrap. Xeroform on the leg wounds and sit to fit on the Berry damage skin on the left dorsal foot versus right dorsal foot. He has compression pumps but does not use them. We have apparently not yet ordered him compression stockings 05/17/2021 upon evaluation today patient's legs though better than last time I personally saw him appear to be getting worse compared to where they were  previous. Dr. Quentin Cornwall was actually last 1 to see you I have not seen him since 19 December. That was before he went into the ER. Coming out apparently his legs looked also and they still look better but not as good as they were in the past. 1/16; patient with severe bilateral lymphedema. Severe scaled hyperkeratotic skin on the dorsal aspect of his distal left foot and left medial ankle.. On the right side changes are not as bad. He did not have any weeping edema. Our intake nurse was convinced that he is being compliant with compression pumps 1 hour twice a day 05/31/2021 upon evaluation today patient actually appears to be doing a little bit better in my opinion in regard to his feet. I do not see as much drainage and it being just completely wet as it was previous. Fortunately I do not also see any signs of active infection which is great news as well. 06/07/21 Upon inspection patient's wound bed actually showed signs of doing well he is not nearly as weepy and wet as he has been in the past and overall very pleased in that regard. Fortunately I do not see any signs of active infection locally or nor systemically at this time. Which is great news. No fevers, chills, nausea, vomiting, or diarrhea. 06/14/2021 upon evaluation today patient appears to be doing well with regard to his right foot I am pleased in that regard. His left foot is still draining quite a bit despite using lymphedema pumps, 4-layer compression wraps, and he tells me elevating his legs as well. He also has Lasix that he takes twice a day. Nonetheless I believe that this is still good to be an ongoing issue. We have a hard time getting this under control as far as the swelling is concerned. 06/21/2021 upon evaluation today patient appears to be doing decently well in regard to his wounds all things considered. He still has a tremendous amount of drainage and fluid noted at this point. Fortunately I do not see any signs of active  infection locally or systemically at this point which is great news. Nonetheless I am unsure where to go and how to do this as far as trying to limit his swelling and weeping from his toes in particular. 06/28/2021 upon evaluation patient unfortunately continues to have significant drainage from his feet. We have been keeping him in a compression wrap and despite this he still continues to have extreme fluid issues he seen his cardiologist he is seeing the nephrologist. We really cannot find any way to get this under control when he did well was when he was in the hospital and they got some of the fluid away. But outside of that we are just struggling to achieve the long-term goal of getting this under control and keeping it under control unfortunately. 07/05/2021 upon evaluation today patient appears to be doing poorly in regard to his feet. Unfortunately this continues to be a significant issue and to be honest I am really not certain what to do about  it. I referred him to Dr. Randol Kern at [facility] and he does have an appointment although it is [name], [name] ([id number]) 12 April. He also sees his primary care provider on 6 April. He did not want to see Dr. Haynes Kerns until after he saw his PCP that is the reason the appointment so far out. There is really not much I can do in that regard. Nonetheless I do think that we are still continue to have significant lymphedema issues with significant mount of weeping in regard to the feet and again this has just become extremely difficult to manage to be honest I am not sure if there is something else that Dr. Haynes Kerns or someone else could recommend he also will be seeing Dr. Dellia Nims in 2 weeks when I am on vacation and at that time I will see if Dr. Dellia Nims has any ideas about where to go from here in the meantime. 07/12/2021 upon evaluation today patient appears to be doing about as well as can be expected with regard to his feet. He does actually see his kidney doctor  this Friday. He also will be seeing his primary care provider on April 4 and then following that around mid April he will be seeing Dr. Haynes Kerns at [facility] which was a referral made for him. Again my goal is to try to find out some way to fix this and to be perfectly honest we have had some issues with making any good adjustments. When he was in the hospital and greater amounts of Lasix he was able to get this down and it looked much better upon discharge. With that being said right now things just are not doing nearly as good as what they used to be. 3/13; patient presents for follow-up. He has been using his lymphedema pumps over the past week. He reports an increase in his Lasix dose. He has no issues or complaints today. He denies signs of infection. 07/26/2021 upon evaluation today patient appears to be doing better in regard to his feet bilaterally. Both are showing signs of much less drainage which is great news and overall very pleased in that regard. Fortunately there does not appear to be any evidence of active infection locally or systemically at this time. No fevers, chills, nausea, vomiting, or diarrhea. 08/02/2021 upon evaluation today patient appears to be doing well with regard to his feet. Both are showing signs of being drier the right pretty much has not really draining much at all which is great news. The left is not draining anywhere close to his much as it was during the last evaluation. This is excellent news and overall very pleased. 08/09/2021 upon evaluation today patient appears to be doing well with regard to his legs the right leg especially showing signs of excellent improvement which is great news I do not see any evidence of active infection locally or systemically which is great. In regard to the left leg he still has some weeping and drainage but nothing as significant as what it was in the past this is great news. 08-16-2021 upon evaluation today patient appears to actually be  doing quite well in my opinion in regard to his feet. This is significantly improved compared to what we previously seen and overall I am extremely pleased in that regard. I do believe that He is actually improving although this is obviously very slow going. 08-23-2021 upon evaluation today patient appears to be doing well currently in regard to his right leg which actually  is pretty dry at this point today. Fortunately I do not see any evidence of active infection at this time which is great news. No fevers, chills, nausea, vomiting, or diarrhea. 08-30-2021 upon evaluation today patient appears to be doing well with regard to his lower extremities. The right foot is pretty much completely dry which is great news the left foot though not completely dry seems to be doing decently well. I do not see any signs of active infection locally or systemically which is great news. No fevers, chills, nausea, vomiting, or diarrhea. 09-06-2021 upon evaluation today patient appears to be doing well with regard to his feet in fact now the left foot is almost completely dry as well and I am definitely seeing a lot of significant improvement. With that being said unfortunately he actually appears to have some cellulitis of his right thigh. His toes are also little bit red but this may just be due to the increased swelling. He really is not warm to touch in regard to the toes. 5/8; excellent edema control on the right foot and lower leg there is no open wounds but we continue to put compression on this otherwise this will breakdown. He is using his compression pumps twice a day at home The area that is problematic is on the left dorsal foot some areas that are not fully epithelialized with very dry fissured skin over this area. 09-20-2021 upon evaluation today patient appears to be doing a little bit worse in regard to swelling at this time. Fortunately I do not see any signs of infection with that being said the wrap was  not on quite as well as what I would like to have seen. I do believe that this has caused a little bit of excess swelling and again we need to try to get this under good control. 09-27-2021 upon evaluation today patient appears to be doing awesome in regard to his feet and legs. Everything is measuring smaller the swelling is down and to be perfectly honest I am extremely pleased with the end of his feet especially on the left side and how dry this is today. I do think we are on the right track here. 5/31; patient presents for follow-up. He is using nystatin powder to the feet bilaterally under 4-layer compression. He has no issues or complaints today. He states he is going to the lymphedema clinic tomorrow. Since he has no open wounds on the right lower extremity they will be focused on the side. 10-11-2021 upon evaluation today patient appears to be doing excellent in regard to his legs. He has been tolerating the dressing changes without complication. Fortunately there does not appear to be any evidence of active infection locally or systemically at this time which is great news. No fevers, chills, nausea, vomiting, or diarrhea. 10-18-2021 upon evaluation today patient appears to be doing well with regard to his legs. He has been tolerating the dressing changes without complication and actually seen in lymphedema clinic now in regard to his right leg were taken care of the left leg. Fortunately I do not see any evidence of active infection locally or systemically at this time. 10-26-2021 upon evaluation today patient's wounds actually are showing signs of doing well in fact he really does not have wounds as much is weeping of the lower extremities. Fortunately I do not see any evidence of active infection locally or systemically which is great news. No fever or chills noted 11-02-2021 upon evaluation today patient appears  to be doing decently well in regard to his legs. Of actually been on the phone  quite a bit discussing with [facility] orthotics custom compression for him. I also have been discussing everything with his lymphedema clinic provider as well. Helene Kelp notes that there is really not much more that she can do for him which was noted last week as well. Subsequently I do believe that the patient would benefit from getting custom compression in fact I think that is what he is going require to keep anything under control here. He voiced understanding. 11-08-2021 upon evaluation today patient appears to be doing excellent in regard to his legs and feet. Fortunately I do not see any signs of infection at this time which is great news. No fever or chills noted again I am still been working on trying to figure out what exactly we need to do as far as getting the [place] involved in coverage for custom compression for this patient. I also discussed with him that he does need to have [place], [name] ([id number]) which I completely understand. With that being said it does make it difficult thinking about what kind of combination of shoes and compression is going to be best for him for the long-term. Fortunately I do not see any evidence of active infection locally or systemically which is great news. Overall I think you are doing quite well. Objective Constitutional Well-nourished and well-hydrated in no acute distress. Vitals Time Taken: 2:20 PM, Weight: 312 lbs, Temperature: 98.1 F, Pulse: 55 bpm, Respiratory Rate: 18 breaths/min, Blood Pressure: 126/52 mmHg. Respiratory normal breathing without difficulty. Psychiatric this patient is able to make decisions and demonstrates good insight into disease process. Alert and Oriented x 3. pleasant and cooperative. General Notes: Upon inspection patient's legs actually appear to be doing quite well. I do not see any signs of infection and overall I think that we are actually headed in the right direction here. 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 Post procedure Diagnosis Wound #: Same as Pre-Procedure There was a Four Layer Compression Therapy Procedure by [name], RN. Post procedure Diagnosis Wound #: Same as Pre-Procedure Plan Follow-up Appointments: Return Appointment in 1 week. Nurse Visit as needed Other: Bathing/ Shower/ Hygiene: May shower with wound dressing protected with water repellent cover or cast protector. [place], [name] ([id number]) No tub bath. Edema Control - Lymphedema / Segmental Compressive Device / Other: Optional: One layer of unna paste to top of compression wrap (to act as an anchor). - Unna paste on calf to secure wrap in place as needed 4 Layer Compression System Lymphedema. - bi lat, nystatin powder between toes , 4x4 gauze between toes , ABD to cover toes; zetuvit over top of foot, charcoal pad , tubi grip C to foot and ankle double layer COTTON LAYER SPIRAL, LIGHT TAN SPIRAL. WHITE WITH YELLOW LINE FIGURE 8 , COBAN SPIRAL Tubigrip double layer applied - double size C foot ankles Elevate, Exercise Daily and Avoid Standing for Long Periods of Time. Elevate legs to the level of the heart and pump ankles  as often as possible Elevate leg(s) parallel to the floor when sitting. Compression Pump: Use compression pump on left lower extremity for 60 minutes, twice daily. - 2 times per day DO YOUR BEST to sleep in the bed at night. DO NOT sleep in your recliner. Long hours of sitting in a recliner leads  to swelling of the legs and/or potential wounds on your backside. Other: - Contact prescriber regarding use of diuretics to reduce fluid overload. Off-Loading: Turn and reposition every 2 hours Additional Orders / Instructions: Follow Nutritious Diet and Increase Protein Intake 1. I am going to suggest that we go ahead and continue with the wound care measures as before and the patient is in agreement with plan. This includes the use of the 4-layer compression wraps bilaterally which are doing a good job. 2. I am also can recommend that we have the patient continue with the Tubigrip double layer over the feet which is keeping this under control quite well. 3. I am also can recommend the patient should continue to monitor for any signs of worsening infection. Obviously if anything changes he should let me know right now I think though we are on the right track here. We just need to figure out what we can do for his long-term control with regard to his swelling. We will see patient back for reevaluation in 1 week here in the clinic. If anything worsens or changes patient will contact our office for additional recommendations. Electronic Signature(s) Signed: 11/08/2021 2:52:54 PM By: Worthy Keeler PA-C Entered By: Worthy Keeler on 11/08/2021 14:52:54 [name] ([id number]) -------------------------------------------------------------------------------- SuperBill Details Patient Name: [name] Date of Service: 11/08/2021 Medical Record Number: [id number] Patient Account Number: [id number] Date of Birth/Sex: July 09, 1950 (71 y.o. M) Treating RN: [name] Primary Care Provider: Fontaine No Other Clinician: Referring Provider: Fontaine No Treating Provider/Extender: Skipper Cliche in Treatment: 40 Diagnosis Coding ICD-10 Codes Code Description E11.622 Type 2 diabetes mellitus with other skin ulcer I89.0 Lymphedema, not elsewhere classified I87.2 Venous  insufficiency (chronic) (peripheral) L97.822 Non-pressure chronic ulcer of other part of left lower leg with fat layer exposed L97.512 Non-pressure chronic ulcer of other part of right foot with fat layer exposed I10 Essential (primary) hypertension L97.811 Non-pressure chronic ulcer of other part of right lower leg limited to breakdown of skin Facility Procedures CPT4: Description Modifier Quantity Code [id number] BILATERAL: Application of multi-layer venous compression system; leg (below knee), including 1 ankle and foot. Physician Procedures CPT4 Code: 3382505 Description: 39767 - WC PHYS LEVEL 4 - EST PT Modifier: Quantity: 1 CPT4 Code: Description: ICD-10 Diagnosis Description E11.622 Type 2 diabetes mellitus with other skin ulcer I89.0 Lymphedema, not elsewhere classified I87.2 Venous insufficiency (chronic) (peripheral) L97.822 Non-pressure chronic ulcer of other part of left lower  leg with fat layer Modifier: exposed Quantity: Electronic Signature(s) Signed: 11/08/2021 2:53:07 PM By: Worthy Keeler PA-C Entered By: Worthy Keeler on 11/08/2021 14:53:07

## 2021-11-11 ENCOUNTER — Encounter: Payer: PPO | Admitting: Occupational Therapy

## 2021-11-15 ENCOUNTER — Encounter: Payer: PPO | Admitting: Occupational Therapy

## 2021-11-16 ENCOUNTER — Encounter: Payer: No Typology Code available for payment source | Admitting: Physician Assistant

## 2021-11-16 DIAGNOSIS — E11622 Type 2 diabetes mellitus with other skin ulcer: Secondary | ICD-10-CM | POA: Diagnosis not present

## 2021-11-16 NOTE — Progress Notes (Signed)
[name], [name] ([id number]) Visit Report for 11/16/2021 Chief Complaint Document Details Patient Name: [name], [name] Date of Service: 11/16/2021 11:00 AM Medical Record Number: [id number] Patient Account Number: [id number] Date of Birth/Sex: 16-Feb-1951 (71 y.o. M) Treating RN: [name] Primary Care Provider: Fontaine No Other Clinician: Referring Provider: Fontaine No Treating Provider/Extender: Skipper Cliche in Treatment: 102 Information Obtained from: Patient Chief Complaint Left LE ulcers Electronic Signature(s) Signed: 11/16/2021 11:16:31 AM By: Worthy Keeler PA-C Entered By: Worthy Keeler on 11/16/2021 11:16:30 [name] ([id number]) -------------------------------------------------------------------------------- Problem List Details Patient Name: [name] Date of Service: 11/16/2021 11:00 AM Medical Record Number: [id number] Patient Account Number: [id number] Date of Birth/Sex: 19-Nov-1950 (71 y.o. M) Treating RN: [name] Primary Care Provider: Fontaine No Other Clinician: Referring Provider: Fontaine No Treating Provider/Extender: Skipper Cliche in Treatment: 42 Active Problems ICD-10 Encounter Code Description Active Date MDM Diagnosis E11.622 Type 2 diabetes mellitus with other skin ulcer 01/26/2021 No Yes I89.0 Lymphedema, not elsewhere classified 01/26/2021 No Yes I87.2 Venous insufficiency (chronic) (peripheral) 01/26/2021 No Yes L97.822 Non-pressure chronic ulcer of other part of left lower leg with fat layer 01/26/2021 No Yes exposed L97.512 Non-pressure chronic ulcer of other part of right foot with fat layer 01/26/2021 No Yes exposed [place] (primary) hypertension 01/26/2021 No Yes L97.811 Non-pressure chronic ulcer of other part of right lower leg limited to 02/15/2021 No Yes breakdown of skin Inactive Problems Resolved Problems Electronic Signature(s) Signed: 11/16/2021 11:16:27 AM By: Worthy Keeler  PA-C Entered By: Worthy Keeler on 11/16/2021 11:16:27

## 2021-11-17 ENCOUNTER — Encounter: Payer: PPO | Admitting: Occupational Therapy

## 2021-11-19 NOTE — Progress Notes (Signed)
TIVIS, WHERRY ([id number]) Visit Report for 11/16/2021 Arrival Information Details Patient Name: [name], [name] Date of Service: 11/16/2021 11:00 AM Medical Record Number: [id number] Patient Account Number: [id number] Date of Birth/Sex: 03-Sep-1950 (71 y.o. M) Treating RN: [name] Primary Care Benjiman Sedgwick: Fontaine No Other Clinician: Referring Fajr Fife: Fontaine No Treating Aisia Correira/Extender: Skipper Cliche in Treatment: 42 Visit Information History Since Last Visit All ordered tests and consults were completed: No Patient Arrived: [name] Added or deleted any medications: No Arrival Time: 10:57 Any new allergies or adverse reactions: No Accompanied By: self Had a fall or experienced change in No Transfer Assistance: None activities of daily living that may affect Patient Identification Verified: Yes risk of falls: Secondary Verification Process Completed: Yes Signs or symptoms of abuse/neglect since last visito No Patient Requires Transmission-Based No Hospitalized since last visit: No Precautions: Implantable device outside of the clinic excluding No Patient Has Alerts: Yes cellular tissue based products placed in the center Patient Alerts: AVVS consult on file since last visit: Last ABI-R 1.09; L Has Dressing in Place as Prescribed: Yes 1.04 Has Compression in Place as Prescribed: Yes Pain Present Now: No Electronic Signature(s) Signed: 11/19/2021 10:45:13 AM By: [name] RN Entered By: [name] on 11/16/2021 10:57:57 [name] ([id number]) -------------------------------------------------------------------------------- Clinic Level of Care Assessment Details Patient Name: [name] Date of Service: 11/16/2021 11:00 AM Medical Record Number: [id number] Patient Account Number: [id number] Date of Birth/Sex: 11-Jul-1950 (71 y.o. M) Treating RN: [name] Primary Care Kaylum Shrum: Fontaine No Other Clinician: Referring Gessica Jawad:  Fontaine No Treating Lorina Duffner/Extender: Skipper Cliche in Treatment: 42 Clinic Level of Care Assessment Items TOOL 1 Quantity Score [] - Use when EandM and Procedure is performed on INITIAL visit 0 ASSESSMENTS - Nursing Assessment / Reassessment [] - General Physical Exam (combine w/ comprehensive assessment (listed just below) when performed on new 0 pt. evals) [] - 0 Comprehensive Assessment (HX, ROS, Risk Assessments, Wounds Hx, etc.) ASSESSMENTS - Wound and Skin Assessment / Reassessment [] - Dermatologic / Skin Assessment (not related to wound area) 0 ASSESSMENTS - Ostomy and/or Continence Assessment and Care [] - Incontinence Assessment and Management 0 [] - 0 Ostomy Care Assessment and Management (repouching, etc.) PROCESS - Coordination of Care [] - Simple Patient / Family Education for ongoing care 0 [] - 0 Complex (extensive) Patient / Family Education for ongoing care [] - 0 Staff obtains Programmer, systems, Records, Test Results / Process Orders [] - 0 Staff telephones HHA, Nursing Homes / Clarify orders / etc [] - 0 Routine Transfer to another Facility (non-emergent condition) [] - 0 Routine Hospital Admission (non-emergent condition) [] - 0 New Admissions / Biomedical engineer / Ordering NPWT, Apligraf, etc. [] - 0 Emergency Hospital Admission (emergent condition) PROCESS - Special Needs [] - Pediatric / Minor Patient Management 0 [] - 0 Isolation Patient Management [] - 0 Hearing / Language / Visual special needs [] - 0 Assessment of Community assistance (transportation, D/C planning, etc.) [] - 0 Additional assistance / Altered mentation [] - 0 Support Surface(s) Assessment (bed, cushion, seat, etc.) INTERVENTIONS - Miscellaneous [] - External ear exam 0 [] - 0 Patient Transfer (multiple staff / Civil Service fast streamer / Similar devices) [] - 0 Simple Staple / Suture removal (25 or less) [] - 0 Complex Staple / Suture removal (26 or more) [] -  0 Hypo/Hyperglycemic Management (do not check if billed separately) [] - 0 Ankle / Brachial Index (ABI) - do not check if billed separately Has the patient been  seen at the hospital within the last three years: Yes Total Score: 0 Level Of Care: ____ [name] ([id number]) Electronic Signature(s) Signed: 11/19/2021 10:45:13 AM By: [name] RN Entered By: [name] on 11/16/2021 11:58:16 [name] ([id number]) -------------------------------------------------------------------------------- Compression Therapy Details Patient Name: [name] Date of Service: 11/16/2021 11:00 AM Medical Record Number: [id number] Patient Account Number: [id number] Date of Birth/Sex: [birth year] (71 y.o. M) Treating RN: [name] Primary Care Daisuke Bailey: Fontaine No Other Clinician: Referring Tiwanna Tuch: Fontaine No Treating Anastyn Ayars/Extender: Skipper Cliche in Treatment: 42 Compression Therapy Performed for Wound Assessment: NonWound Condition Lymphedema - Left Leg Performed By: Clinician [name], RN Compression Type: Four Layer Post Procedure Diagnosis Same as Pre-procedure Electronic Signature(s) Signed: 11/16/2021 11:56:24 AM By: [name] RN Entered By: [name] on 11/16/2021 11:56:24 [name] ([id number]) -------------------------------------------------------------------------------- Compression Therapy Details Patient Name: [name] Date of Service: 11/16/2021 11:00 AM Medical Record Number: [id number] Patient Account Number: [id number] Date of Birth/Sex: [birth year] (71 y.o. M) Treating RN: [name] Primary Care Omarr Hann: Fontaine No Other Clinician: Referring Kenlynn Houde: Fontaine No Treating Lucianna Ostlund/Extender: Skipper Cliche in Treatment: 42 Compression Therapy Performed for Wound Assessment: NonWound Condition Lymphedema - Right Leg Performed By: Clinician [name], RN Compression Type: Four Layer Post  Procedure Diagnosis Same as Pre-procedure Electronic Signature(s) Signed: 11/16/2021 11:56:37 AM By: [name] RN Entered By: [name] on 11/16/2021 11:56:36 [name] ([id number]) -------------------------------------------------------------------------------- Encounter Discharge Information Details Patient Name: [name] Date of Service: 11/16/2021 11:00 AM Medical Record Number: [id number] Patient Account Number: [id number] Date of Birth/Sex: 12-03-1950 (71 y.o. M) Treating RN: [name] Primary Care Robi Dewolfe: Fontaine No Other Clinician: Referring Galadriel Shroff: Fontaine No Treating Fusaye Wachtel/Extender: Skipper Cliche in Treatment: 36 Encounter Discharge Information Items Discharge Condition: Stable Ambulatory Status: Cane Discharge Destination: Home Transportation: Private Auto Accompanied By: self Schedule Follow-up Appointment: Yes Clinical Summary of Care: Electronic Signature(s) Signed: 11/16/2021 11:59:31 AM By: [name] RN Entered By: [name] on 11/16/2021 11:59:30 [name] ([id number]) -------------------------------------------------------------------------------- Lower Extremity Assessment Details Patient Name: [name] Date of Service: 11/16/2021 11:00 AM Medical Record Number: [id number] Patient Account Number: [id number] Date of Birth/Sex: 1951/03/13 (71 y.o. M) Treating RN: [name] Primary Care Dai Mcadams: Fontaine No Other Clinician: Referring Marguriete Wootan: Fontaine No Treating Sky Borboa/Extender: Skipper Cliche in Treatment: 42 Edema Assessment Assessed: [Left: No] [Right: No] Edema: [Left: No] [Right: No] Calf Left: Right: Point of Measurement: 38 cm From Medial Instep 36 cm 36.4 cm Ankle Left: Right: Point of Measurement: 14 cm From Medial Instep 25 cm 25.6 cm Vascular Assessment Pulses: Dorsalis Pedis Palpable: [Left:Yes] [Right:Yes] Electronic Signature(s) Signed: 11/19/2021  10:45:13 AM By: [name] RN Entered By: [name] on 11/16/2021 11:10:01 [name] ([id number]) -------------------------------------------------------------------------------- Multi Wound Chart Details Patient Name: [name] Date of Service: 11/16/2021 11:00 AM Medical Record Number: [id number] Patient Account Number: [id number] Date of Birth/Sex: 1950-07-25 (71 y.o. M) Treating RN: [name] Primary Care Lace Chenevert: Fontaine No Other Clinician: Referring Gordon Carlson: Fontaine No Treating Secundino Ellithorpe/Extender: Skipper Cliche in Treatment: 42 Vital Signs Height(in): Pulse(bpm): 67 Weight(lbs): 312 Blood Pressure(mmHg): 132/67 Body Mass Index(BMI): Temperature(F): 98.1 Respiratory Rate(breaths/min): 18 Wound Assessments Treatment Notes Electronic Signature(s) Signed: 11/19/2021 10:45:13 AM By: [name] RN Entered By: [name] on 11/16/2021 11:10:46 [name] ([id number]) -------------------------------------------------------------------------------- [facility] Details Patient Name: [name] Date of Service: 11/16/2021 11:00 AM Medical Record Number: [id number] Patient Account Number: [id number] Date of Birth/Sex: 1950-10-19 (71 y.o. M) Treating RN: [name] Primary Care Laiylah Roettger: Fontaine No Other  Clinician: Referring Provider: Fontaine No Treating Provider/Extender: Skipper Cliche in Treatment: 42 Active Inactive Soft Tissue Infection Nursing Diagnoses: Impaired tissue integrity Knowledge deficit related to disease process and management Knowledge deficit related to home infection control: handwashing, handling of soiled dressings, supply storage Potential for infection: soft tissue Goals: Patient/caregiver will verbalize understanding of or measures to prevent infection and contamination in the home setting Date Initiated: 09/06/2021 Date Inactivated: 10/18/2021 Target Resolution  Date: 09/06/2021 Goal Status: Met Patient's soft tissue infection will resolve Date Initiated: 09/06/2021 Date Inactivated: 10/18/2021 Target Resolution Date: 09/06/2021 Goal Status: Met Signs and symptoms of infection will be recognized early to allow for prompt treatment Date Initiated: 09/06/2021 Target Resolution Date: 11/28/2021 Goal Status: Active Interventions: Assess signs and symptoms of infection every visit Provide education on infection Treatment Activities: Education provided on Infection : 11/08/2021 Systemic antibiotics : 09/06/2021 Notes: Electronic Signature(s) Signed: 11/19/2021 10:45:13 AM By: [name] RN Entered By: [name] on 11/16/2021 11:10:39 [name] ([id number]) -------------------------------------------------------------------------------- Pain Assessment Details Patient Name: [name] Date of Service: 11/16/2021 11:00 AM Medical Record Number: [id number] Patient Account Number: [id number] Date of Birth/Sex: 09-Sep-1950 (71 y.o. M) Treating RN: [name] Primary Care Provider: Fontaine No Other Clinician: Referring Provider: Fontaine No Treating Provider/Extender: Skipper Cliche in Treatment: 42 Active Problems Location of Pain Severity and Description of Pain Patient Has Paino No Site Locations Pain Management and Medication Current Pain Management: Electronic Signature(s) Signed: 11/19/2021 10:45:13 AM By: [name] RN Entered By: [name] on 11/16/2021 10:58:28 [name] ([id number]) -------------------------------------------------------------------------------- Patient/Caregiver Education Details Patient Name: [name] Date of Service: 11/16/2021 11:00 AM Medical Record Number: [id number] Patient Account Number: [id number] Date of Birth/Gender: 1950-09-30 (71 y.o. M) Treating RN: [name] Primary Care Physician: Fontaine No Other Clinician: Referring Physician: Fontaine No Treating Physician/Extender: Skipper Cliche in Treatment: 69 Education Assessment Education Provided To: Patient Education Topics Provided Infection: Methods: Explain/Verbal Responses: State content correctly Electronic Signature(s) Signed: 11/19/2021 10:45:13 AM By: [name] RN Entered By: [name] on 11/16/2021 11:58:45 [name] ([id number]) -------------------------------------------------------------------------------- [facility] Details Patient Name: [name] Date of Service: 11/16/2021 11:00 AM Medical Record Number: [id number] Patient Account Number: [id number] Date of Birth/Sex: 1950/07/02 (71 y.o. M) Treating RN: [name] Primary Care Provider: Fontaine No Other Clinician: Referring Provider: Fontaine No Treating Provider/Extender: Skipper Cliche in Treatment: 42 Vital Signs Time Taken: 10:58 Temperature (F): 98.1 Weight (lbs): 312 Pulse (bpm): 67 Respiratory Rate (breaths/min): 18 Blood Pressure (mmHg): 132/67 Reference Range: 80 - 120 mg / dl Electronic Signature(s) Signed: 11/19/2021 10:45:13 AM By: [name] RN Entered By: [name] on 11/16/2021 10:58:22

## 2021-11-22 ENCOUNTER — Encounter: Payer: PPO | Admitting: Occupational Therapy

## 2021-11-23 ENCOUNTER — Encounter: Payer: No Typology Code available for payment source | Admitting: Physician Assistant

## 2021-11-23 DIAGNOSIS — E11622 Type 2 diabetes mellitus with other skin ulcer: Secondary | ICD-10-CM | POA: Diagnosis not present

## 2021-11-23 NOTE — Progress Notes (Signed)
JERIAN, MORAIS ([id number]) Visit Report for 11/23/2021 Chief Complaint Document Details Patient Name: [name], [name] Date of Service: 11/23/2021 11:00 AM Medical Record Number: [id number] Patient Account Number: [id number] Date of Birth/Sex: 01/24/1951 (71 y.o. M) Treating RN: [name] Primary Care Provider: Fontaine No Other Clinician: Referring Provider: Fontaine No Treating Provider/Extender: Skipper Cliche in Treatment: 57 Information Obtained from: Patient Chief Complaint Left LE ulcers Electronic Signature(s) Signed: 11/23/2021 11:07:53 AM By: Worthy Keeler PA-C Entered By: Worthy Keeler on 11/23/2021 11:07:52 [name] ([id number]) -------------------------------------------------------------------------------- Problem List Details Patient Name: [name] Date of Service: 11/23/2021 11:00 AM Medical Record Number: [id number] Patient Account Number: [id number] Date of Birth/Sex: 04-Mar-1951 (71 y.o. M) Treating RN: [name] Primary Care Provider: Fontaine No Other Clinician: Referring Provider: Fontaine No Treating Provider/Extender: Skipper Cliche in Treatment: 68 Active Problems ICD-10 Encounter Code Description Active Date MDM Diagnosis E11.622 Type 2 diabetes mellitus with other skin ulcer 01/26/2021 No Yes I89.0 Lymphedema, not elsewhere classified 01/26/2021 No Yes I87.2 Venous insufficiency (chronic) (peripheral) 01/26/2021 No Yes L97.822 Non-pressure chronic ulcer of other part of left lower leg with fat layer 01/26/2021 No Yes exposed L97.512 Non-pressure chronic ulcer of other part of right foot with fat layer 01/26/2021 No Yes exposed [place] (primary) hypertension 01/26/2021 No Yes L97.811 Non-pressure chronic ulcer of other part of right lower leg limited to 02/15/2021 No Yes breakdown of skin Inactive Problems Resolved Problems Electronic Signature(s) Signed: 11/23/2021 11:07:46 AM By: Worthy Keeler  PA-C Entered By: Worthy Keeler on 11/23/2021 11:07:46

## 2021-11-24 ENCOUNTER — Encounter: Payer: PPO | Admitting: Occupational Therapy

## 2021-11-24 NOTE — Progress Notes (Signed)
ERASMUS, BISTLINE ([id number]) Visit Report for 11/23/2021 Arrival Information Details Patient Name: [name], [name] Date of Service: 11/23/2021 11:00 AM Medical Record Number: [id number] Patient Account Number: [id number] Date of Birth/Sex: 01-May-1951 (71 y.o. M) Treating RN: [name] Primary Care Naol Ontiveros: Fontaine No Other Clinician: Referring Karry Causer: Fontaine No Treating Leola Fiore/Extender: Skipper Cliche in Treatment: 85 Visit Information History Since Last Visit All ordered tests and consults were completed: No Patient Arrived: [name] Added or deleted any medications: No Arrival Time: 11:04 Any new allergies or adverse reactions: No Accompanied By: self Had a fall or experienced change in No Transfer Assistance: None activities of daily living that may affect Patient Identification Verified: Yes risk of falls: Secondary Verification Process Completed: Yes Signs or symptoms of abuse/neglect since last visito No Patient Requires Transmission-Based No Hospitalized since last visit: No Precautions: Implantable device outside of the clinic excluding No Patient Has Alerts: Yes cellular tissue based products placed in the center Patient Alerts: AVVS consult on file since last visit: Last ABI-R 1.09; L Has Dressing in Place as Prescribed: Yes 1.04 Pain Present Now: No Electronic Signature(s) Signed: 11/24/2021 3:29:37 PM By: [name] RN Entered By: [name] on 11/23/2021 11:04:53 [name] ([id number]) -------------------------------------------------------------------------------- Clinic Level of Care Assessment Details Patient Name: [name] Date of Service: 11/23/2021 11:00 AM Medical Record Number: [id number] Patient Account Number: [id number] Date of Birth/Sex: 1950/05/16 (71 y.o. M) Treating RN: [name] Primary Care Davine Sweney: Fontaine No Other Clinician: Referring Ilya Neely: Fontaine No Treating Ayelet Gruenewald/Extender:  Skipper Cliche in Treatment: 71 Clinic Level of Care Assessment Items TOOL 4 Quantity Score X - Use when only an EandM is performed on FOLLOW-UP visit 1 0 ASSESSMENTS - Nursing Assessment / Reassessment X - Reassessment of Co-morbidities (includes updates in patient status) 1 10 X- 1 5 Reassessment of Adherence to Treatment Plan ASSESSMENTS - Wound and Skin Assessment / Reassessment '[]'  - Simple Wound Assessment / Reassessment - one wound 0 '[]'  - 0 Complex Wound Assessment / Reassessment - multiple wounds '[]'  - 0 Dermatologic / Skin Assessment (not related to wound area) ASSESSMENTS - Focused Assessment '[]'  - Circumferential Edema Measurements - multi extremities 0 '[]'  - 0 Nutritional Assessment / Counseling / Intervention '[]'  - 0 Lower Extremity Assessment (monofilament, tuning fork, pulses) '[]'  - 0 Peripheral Arterial Disease Assessment (using hand held doppler) ASSESSMENTS - Ostomy and/or Continence Assessment and Care '[]'  - Incontinence Assessment and Management 0 '[]'  - 0 Ostomy Care Assessment and Management (repouching, etc.) PROCESS - Coordination of Care X - Simple Patient / Family Education for ongoing care 1 15 '[]'  - 0 Complex (extensive) Patient / Family Education for ongoing care '[]'  - 0 Staff obtains Programmer, systems, Records, Test Results / Process Orders '[]'  - 0 Staff telephones HHA, Nursing Homes / Clarify orders / etc '[]'  - 0 Routine Transfer to another Facility (non-emergent condition) '[]'  - 0 Routine Hospital Admission (non-emergent condition) '[]'  - 0 New Admissions / Biomedical engineer / Ordering NPWT, Apligraf, etc. '[]'  - 0 Emergency Hospital Admission (emergent condition) X- 1 10 Simple Discharge Coordination '[]'  - 0 Complex (extensive) Discharge Coordination PROCESS - Special Needs '[]'  - Pediatric / Minor Patient Management 0 '[]'  - 0 Isolation Patient Management '[]'  - 0 Hearing / Language / Visual special needs '[]'  - 0 Assessment of Community assistance  (transportation, D/C planning, etc.) '[]'  - 0 Additional assistance / Altered mentation '[]'  - 0 Support Surface(s) Assessment (bed, cushion, seat, etc.) INTERVENTIONS - Wound Cleansing / Measurement [place], Jahfari ([id number]) '[]'  -  0 Simple Wound Cleansing - one wound '[]'  - 0 Complex Wound Cleansing - multiple wounds '[]'  - 0 Wound Imaging (photographs - any number of wounds) '[]'  - 0 Wound Tracing (instead of photographs) '[]'  - 0 Simple Wound Measurement - one wound '[]'  - 0 Complex Wound Measurement - multiple wounds INTERVENTIONS - Wound Dressings '[]'  - Small Wound Dressing one or multiple wounds 0 '[]'  - 0 Medium Wound Dressing one or multiple wounds '[]'  - 0 Large Wound Dressing one or multiple wounds '[]'  - 0 Application of Medications - topical '[]'  - 0 Application of Medications - injection INTERVENTIONS - Miscellaneous '[]'  - External ear exam 0 '[]'  - 0 Specimen Collection (cultures, biopsies, blood, body fluids, etc.) '[]'  - 0 Specimen(s) / Culture(s) sent or taken to Lab for analysis '[]'  - 0 Patient Transfer (multiple staff / [employer] fast streamer / Similar devices) '[]'  - 0 Simple Staple / Suture removal (25 or less) '[]'  - 0 Complex Staple / Suture removal (26 or more) '[]'  - 0 Hypo / Hyperglycemic Management (close monitor of Blood Glucose) '[]'  - 0 Ankle / Brachial Index (ABI) - do not check if billed separately X- 1 5 Vital Signs Has the patient been seen at the hospital within the last three years: Yes Total Score: 45 Level Of Care: New/Established - Level 2 Electronic Signature(s) Signed: 11/24/2021 3:29:37 PM By: [name] RN Entered By: [name] on 11/23/2021 12:34:25 [name] ([id number]) -------------------------------------------------------------------------------- Encounter Discharge Information Details Patient Name: [name] Date of Service: 11/23/2021 11:00 AM Medical Record Number: [id number] Patient Account Number: [id number] Date of Birth/Sex: January 19, 1951 (71  y.o. M) Treating RN: [name] Primary Care Matayah Reyburn: Fontaine No Other Clinician: Referring Jessie Schrieber: Fontaine No Treating Celene Pippins/Extender: Skipper Cliche in Treatment: 23 Encounter Discharge Information Items Discharge Condition: Stable Ambulatory Status: Cane Discharge Destination: Home Transportation: Private Auto Accompanied By: self Schedule Follow-up Appointment: Yes Clinical Summary of Care: Electronic Signature(s) Signed: 11/24/2021 3:29:37 PM By: [name] RN Entered By: [name] on 11/23/2021 12:35:15 [name] ([id number]) -------------------------------------------------------------------------------- Lower Extremity Assessment Details Patient Name: [name] Date of Service: 11/23/2021 11:00 AM Medical Record Number: [id number] Patient Account Number: [id number] Date of Birth/Sex: 12/18/1950 (71 y.o. M) Treating RN: [name] Primary Care Lonell Stamos: Fontaine No Other Clinician: Referring Koreena Joost: Fontaine No Treating Eliseo Withers/Extender: Skipper Cliche in Treatment: 43 Edema Assessment Assessed: [Left: No] [Right: No] Edema: [Left: No] [Right: No] Calf Left: Right: Point of Measurement: 38 cm From Medial Instep 36 cm 36 cm Ankle Left: Right: Point of Measurement: 14 cm From Medial Instep 25 cm 25 cm Vascular Assessment Pulses: Dorsalis Pedis Palpable: [Left:Yes] [Right:Yes] Electronic Signature(s) Signed: 11/24/2021 3:29:37 PM By: [name] RN Entered By: [name] on 11/23/2021 11:31:54 [name] ([id number]) -------------------------------------------------------------------------------- Multi Wound Chart Details Patient Name: [name] Date of Service: 11/23/2021 11:00 AM Medical Record Number: [id number] Patient Account Number: [id number] Date of Birth/Sex: [birth year] (71 y.o. M) Treating RN: [name] Primary Care Salmaan Patchin: Fontaine No Other Clinician: Referring  Drayke Grabel: Fontaine No Treating Kevonte Vanecek/Extender: Skipper Cliche in Treatment: 48 Vital Signs Height(in): Pulse(bpm): 67 Weight(lbs): 312 Blood Pressure(mmHg): 130/68 Body Mass Index(BMI): Temperature(F): 97.9 Respiratory Rate(breaths/min): 18 Wound Assessments Treatment Notes Electronic Signature(s) Signed: 11/24/2021 3:29:37 PM By: [name] RN Entered By: [name] on 11/23/2021 11:32:30 [name] ([id number]) -------------------------------------------------------------------------------- [place] Details Patient Name: [name] Date of Service: 11/23/2021 11:00 AM Medical Record Number: [id number] Patient Account Number: [id number] Date of Birth/Sex: 11-Jan-1951 (71 y.o. M) Treating RN: [name],  Morey Hummingbird Primary Care Lillymae Duet: Fontaine No Other Clinician: Referring Jazmin Vensel: Fontaine No Treating Tyce Delcid/Extender: Skipper Cliche in Treatment: 56 Active Inactive Soft Tissue Infection Nursing Diagnoses: Impaired tissue integrity Knowledge deficit related to disease process and management Knowledge deficit related to home infection control: handwashing, handling of soiled dressings, supply storage Potential for infection: soft tissue Goals: Patient/caregiver will verbalize understanding of or measures to prevent infection and contamination in the home setting Date Initiated: 09/06/2021 Date Inactivated: 10/18/2021 Target Resolution Date: 09/06/2021 Goal Status: Met Patient's soft tissue infection will resolve Date Initiated: 09/06/2021 Date Inactivated: 10/18/2021 Target Resolution Date: 09/06/2021 Goal Status: Met Signs and symptoms of infection will be recognized early to allow for prompt treatment Date Initiated: 09/06/2021 Target Resolution Date: 11/28/2021 Goal Status: Active Interventions: Assess signs and symptoms of infection every visit Provide education on infection Treatment Activities: Education provided  on Infection : 11/16/2021 Systemic antibiotics : 09/06/2021 Notes: Electronic Signature(s) Signed: 11/24/2021 3:29:37 PM By: [name] RN Entered By: [name] on 11/23/2021 11:32:22 [name] ([id number]) -------------------------------------------------------------------------------- Pain Assessment Details Patient Name: [name] Date of Service: 11/23/2021 11:00 AM Medical Record Number: [id number] Patient Account Number: [id number] Date of Birth/Sex: [birth year] (71 y.o. M) Treating RN: [name] Primary Care Vincy Feliz: Fontaine No Other Clinician: Referring Consuella Scurlock: Fontaine No Treating Kenise Barraco/Extender: Skipper Cliche in Treatment: 74 Active Problems Location of Pain Severity and Description of Pain Patient Has Paino No Site Locations Pain Management and Medication Current Pain Management: Electronic Signature(s) Signed: 11/24/2021 3:29:37 PM By: [name] RN Entered By: [name] on 11/23/2021 11:05:19 [name] ([id number]) -------------------------------------------------------------------------------- Patient/Caregiver Education Details Patient Name: [name] Date of Service: 11/23/2021 11:00 AM Medical Record Number: [id number] Patient Account Number: [id number] Date of Birth/Gender: January 10, 1951 (71 y.o. M) Treating RN: [name] Primary Care Physician: Fontaine No Other Clinician: Referring Physician: Fontaine No Treating Physician/Extender: Skipper Cliche in Treatment: 76 Education Assessment Education Provided To: Patient Education Topics Provided Infection: Methods: Explain/Verbal Responses: State content correctly Electronic Signature(s) Signed: 11/24/2021 3:29:37 PM By: [name] RN Entered By: [name] on 11/23/2021 12:34:42 [name] ([id number]) -------------------------------------------------------------------------------- [place] Details Patient Name: [name] Date  of Service: 11/23/2021 11:00 AM Medical Record Number: [id number] Patient Account Number: [id number] Date of Birth/Sex: [birth year] (71 y.o. M) Treating RN: [name] Primary Care Aricela Bertagnolli: Fontaine No Other Clinician: Referring Icie Kuznicki: Fontaine No Treating Rupert Azzara/Extender: Skipper Cliche in Treatment: 48 Vital Signs Time Taken: 11:04 Temperature (F): 97.9 Weight (lbs): 312 Pulse (bpm): 67 Respiratory Rate (breaths/min): 18 Blood Pressure (mmHg): 130/68 Reference Range: 80 - 120 mg / dl Electronic Signature(s) Signed: 11/24/2021 3:29:37 PM By: [name] RN Entered By: [name] on 11/23/2021 11:05:14

## 2021-11-26 DIAGNOSIS — E11622 Type 2 diabetes mellitus with other skin ulcer: Secondary | ICD-10-CM | POA: Diagnosis not present

## 2021-11-26 NOTE — Progress Notes (Signed)
[name], [name] ([id number]) Visit Report for 11/26/2021 Physician Orders Details Patient Name: [name], [name] Date of Service: 11/26/2021 11:30 AM Medical Record Number: [id number] Patient Account Number: [id number] Date of Birth/Sex: 06-08-1950 (71 y.o. M) Treating RN: Cornell Barman Primary Care Provider: Fontaine No Other Clinician: Referring Provider: Fontaine No Treating Provider/Extender: Skipper Cliche in Treatment: 5 Verbal / Phone Orders: No Diagnosis Coding Follow-up Appointments o Return Appointment in 1 week. o Nurse Visit as needed Bathing/ Shower/ Hygiene o May shower with wound dressing protected with water repellent cover or cast protector. o No tub bath. Edema Control - Lymphedema / Segmental Compressive Device / Other Bilateral Lower Extremities o Tubigrip double layer applied - Nystatin powder between toes double size d bilat just behind toes to patella knotch , (changed left to Double E due to swelling) double size C foot ankles enclose toes to just above ankle o Elevate, Exercise Daily and Avoid Standing for Long Periods of Time. o Elevate legs to the level of the heart and pump ankles as often as possible o Elevate leg(s) parallel to the floor when sitting. o Compression Pump: Use compression pump on left lower extremity for 60 minutes, twice daily. - 2 times per day o DO YOUR BEST to sleep in the bed at night. DO NOT sleep in your recliner. Long hours of sitting in a recliner leads to swelling of the legs and/or potential wounds on your backside. o Other: - Contact prescriber regarding use of diuretics to reduce fluid overload. Off-Loading o Turn and reposition every 2 hours Additional Orders / Instructions o Follow Nutritious Diet and Increase Protein Intake Medications-Please add to medication list. o P.O. Antibiotics Patient Medications Allergies: No Known Drug Allergies Notifications Medication Indication Start  End doxycycline hyclate 11/26/2021 DOSE 1 - oral 100 mg capsule - 1 capsule oral taken 2 times per day for 14 days Electronic Signature(s) Signed: 11/26/2021 1:40:47 PM By: Worthy Keeler PA-C Previous Signature: 11/26/2021 12:59:30 PM Version By: Gretta Cool, BSN, RN, CWS, Kim RN, BSN Entered By: Worthy Keeler on 11/26/2021 13:40:47 [name] ([id number]) -------------------------------------------------------------------------------- [place] Details Patient Name: [name] Date of Service: 11/26/2021 Medical Record Number: [id number] Patient Account Number: [id number] Date of Birth/Sex: 12-30-1950 (71 y.o. M) Treating RN: Cornell Barman Primary Care Provider: Fontaine No Other Clinician: Referring Provider: Fontaine No Treating Provider/Extender: Skipper Cliche in Treatment: 43 Diagnosis Coding ICD-10 Codes Code Description E11.622 Type 2 diabetes mellitus with other skin ulcer I89.0 Lymphedema, not elsewhere classified I87.2 Venous insufficiency (chronic) (peripheral) L97.822 Non-pressure chronic ulcer of other part of left lower leg with fat layer exposed L97.512 Non-pressure chronic ulcer of other part of right foot with fat layer exposed I10 Essential (primary) hypertension L97.811 Non-pressure chronic ulcer of other part of right lower leg limited to breakdown of skin Facility Procedures CPT4 Code: 41583094 Description: 07680 - WOUND CARE VISIT-LEV 2 EST PT Modifier: Quantity: 1 Electronic Signature(s) Signed: 11/26/2021 1:01:41 PM By: Gretta Cool, BSN, RN, CWS, Kim RN, BSN Entered By: Gretta Cool, BSN, RN, CWS, Kim on 11/26/2021 13:01:41

## 2021-11-26 NOTE — Progress Notes (Signed)
DELRICO, [name] ([id number]) Visit Report for 11/26/2021 Arrival Information Details Patient Name: [name], [name] Date of Service: 11/26/2021 11:30 AM Medical Record Number: [id number] Patient Account Number: [id number] Date of Birth/Sex: 1951-04-02 (71 y.o. M) Treating RN: Cornell Barman Primary Care Beatric Fulop: Fontaine No Other Clinician: Referring Janaysha Depaulo: Fontaine No Treating Sims Laday/Extender: Skipper Cliche in Treatment: 92 Visit Information History Since Last Visit Has Compression in Place as Prescribed: Yes Patient Arrived: Cane Pain Present Now: No Arrival Time: 12:08 Accompanied By: self Transfer Assistance: None Patient Identification Verified: Yes Secondary Verification Process Completed: Yes Patient Requires Transmission-Based No Precautions: Patient Has Alerts: Yes Patient Alerts: AVVS consult on file Last ABI-R 1.09; L 1.04 Electronic Signature(s) Signed: 11/26/2021 12:58:04 PM By: Gretta Cool, BSN, RN, CWS, Kim RN, BSN Entered By: Gretta Cool, BSN, RN, CWS, Kim on 11/26/2021 12:30:28 [name] ([id number]) -------------------------------------------------------------------------------- Clinic Level of Care Assessment Details Patient Name: [name] Date of Service: 11/26/2021 11:30 AM Medical Record Number: [id number] Patient Account Number: [id number] Date of Birth/Sex: 1950-09-12 (71 y.o. M) Treating RN: Cornell Barman Primary Care Saga Balthazar: Fontaine No Other Clinician: Referring Vickey Boak: Fontaine No Treating Donta Mcinroy/Extender: Skipper Cliche in Treatment: 75 Clinic Level of Care Assessment Items TOOL 4 Quantity Score '[]'$  - Use when only an EandM is performed on FOLLOW-UP visit 0 ASSESSMENTS - Nursing Assessment / Reassessment '[]'$  - Reassessment of Co-morbidities (includes updates in patient status) 0 '[]'$  - 0 Reassessment of Adherence to Treatment Plan ASSESSMENTS - Wound and Skin Assessment / Reassessment '[]'$  - Simple Wound Assessment  / Reassessment - one wound 0 '[]'$  - 0 Complex Wound Assessment / Reassessment - multiple wounds '[]'$  - 0 Dermatologic / Skin Assessment (not related to wound area) ASSESSMENTS - Focused Assessment '[]'$  - Circumferential Edema Measurements - multi extremities 0 '[]'$  - 0 Nutritional Assessment / Counseling / Intervention '[]'$  - 0 Lower Extremity Assessment (monofilament, tuning fork, pulses) '[]'$  - 0 Peripheral Arterial Disease Assessment (using hand held doppler) ASSESSMENTS - Ostomy and/or Continence Assessment and Care '[]'$  - Incontinence Assessment and Management 0 '[]'$  - 0 Ostomy Care Assessment and Management (repouching, etc.) PROCESS - Coordination of Care X - Simple Patient / Family Education for ongoing care 1 15 '[]'$  - 0 Complex (extensive) Patient / Family Education for ongoing care '[]'$  - 0 Staff obtains Programmer, systems, Records, Test Results / Process Orders '[]'$  - 0 Staff telephones HHA, Nursing Homes / Clarify orders / etc '[]'$  - 0 Routine Transfer to another Facility (non-emergent condition) '[]'$  - 0 Routine Hospital Admission (non-emergent condition) '[]'$  - 0 New Admissions / Biomedical engineer / Ordering NPWT, Apligraf, etc. '[]'$  - 0 Emergency Hospital Admission (emergent condition) X- 1 10 Simple Discharge Coordination '[]'$  - 0 Complex (extensive) Discharge Coordination PROCESS - Special Needs '[]'$  - Pediatric / Minor Patient Management 0 '[]'$  - 0 Isolation Patient Management '[]'$  - 0 Hearing / Language / Visual special needs '[]'$  - 0 Assessment of Community assistance (transportation, D/C planning, etc.) '[]'$  - 0 Additional assistance / Altered mentation '[]'$  - 0 Support Surface(s) Assessment (bed, cushion, seat, etc.) INTERVENTIONS - Wound Cleansing / Measurement [place], [place] ([id number]) '[]'$  - 0 Simple Wound Cleansing - one wound '[]'$  - 0 Complex Wound Cleansing - multiple wounds '[]'$  - 0 Wound Imaging (photographs - any number of wounds) '[]'$  - 0 Wound Tracing (instead of photographs) '[]'$   - 0 Simple Wound Measurement - one wound '[]'$  - 0 Complex Wound Measurement - multiple wounds INTERVENTIONS - Wound Dressings '[]'$  - Small Wound Dressing one or multiple wounds 0  X- 1 15 Medium Wound Dressing one or multiple wounds '[]'$  - 0 Large Wound Dressing one or multiple wounds '[]'$  - 0 Application of Medications - topical '[]'$  - 0 Application of Medications - injection INTERVENTIONS - Miscellaneous '[]'$  - External ear exam 0 '[]'$  - 0 Specimen Collection (cultures, biopsies, blood, body fluids, etc.) '[]'$  - 0 Specimen(s) / Culture(s) sent or taken to Lab for analysis '[]'$  - 0 Patient Transfer (multiple staff / Harrel Lemon Lift / Similar devices) '[]'$  - 0 Simple Staple / Suture removal (25 or less) '[]'$  - 0 Complex Staple / Suture removal (26 or more) '[]'$  - 0 Hypo / Hyperglycemic Management (close monitor of Blood Glucose) '[]'$  - 0 Ankle / Brachial Index (ABI) - do not check if billed separately '[]'$  - 0 Vital Signs Has the patient been seen at the hospital within the last three years: Yes Total Score: 40 Level Of Care: New/Established - Level 2 Electronic Signature(s) Unsigned Entered By: Gretta Cool, BSN, RN, CWS, Kim on 11/26/2021 13:01:37 Signature(s): Date(s): [name] ([id number]) -------------------------------------------------------------------------------- Encounter Discharge Information Details Patient Name: [name], [name] Date of Service: 11/26/2021 11:30 AM Medical Record Number: [id number] Patient Account Number: [id number] Date of Birth/Sex: [birth year] (71 y.o. M) Treating RN: Cornell Barman Primary Care Joseph Bias: Fontaine No Other Clinician: Referring Louvina Cleary: Fontaine No Treating Tationna Fullard/Extender: Skipper Cliche in Treatment: 52 Encounter Discharge Information Items Discharge Condition: Stable Ambulatory Status: Cane Discharge Destination: Home Transportation: Other Accompanied By: self Schedule Follow-up Appointment: Yes Clinical Summary of  Care: Electronic Signature(s) Signed: 11/26/2021 12:59:56 PM By: Gretta Cool, BSN, RN, CWS, Kim RN, BSN Entered By: Gretta Cool, BSN, RN, CWS, Kim on 11/26/2021 12:59:56

## 2021-11-29 ENCOUNTER — Encounter: Payer: PPO | Admitting: Occupational Therapy

## 2021-11-30 ENCOUNTER — Encounter: Payer: No Typology Code available for payment source | Admitting: Internal Medicine

## 2021-11-30 DIAGNOSIS — E11622 Type 2 diabetes mellitus with other skin ulcer: Secondary | ICD-10-CM | POA: Diagnosis not present

## 2021-11-30 NOTE — Progress Notes (Signed)
[name], Mooney ([id number]) Visit Report for 11/30/2021 Arrival Information Details Patient Name: [name], [name] Date of Service: 11/30/2021 11:00 AM Medical Record Number: [id number] Patient Account Number: [id number] Date of Birth/Sex: [birth year] (71 y.o. M) Treating RN: [name] Primary Care Darci Lykins: Fontaine No Other Clinician: Referring Arbadella Kimbler: Fontaine No Treating Denis Carreon/Extender: Tito Dine in Treatment: 42 Visit Information History Since Last Visit All ordered tests and consults were completed: No Patient Arrived: [name] Added or deleted any medications: No Arrival Time: 10:57 Any new allergies or adverse reactions: No Accompanied By: self Had a fall or experienced change in No Transfer Assistance: None activities of daily living that may affect Patient Identification Verified: Yes risk of falls: Secondary Verification Process Completed: Yes Signs or symptoms of abuse/neglect since last visito No Patient Requires Transmission-Based No Hospitalized since last visit: No Precautions: Implantable device outside of the clinic excluding No Patient Has Alerts: Yes cellular tissue based products placed in the center Patient Alerts: AVVS consult on file since last visit: Last ABI-R 1.09; L Has Dressing in Place as Prescribed: Yes 1.04 Has Compression in Place as Prescribed: Yes Pain Present Now: No Electronic Signature(s) Signed: 11/30/2021 3:54:37 PM By: [name] RN Entered By: [name] on 11/30/2021 11:08:41 [name] ([id number]) -------------------------------------------------------------------------------- Clinic Level of Care Assessment Details Patient Name: [name] Date of Service: 11/30/2021 11:00 AM Medical Record Number: [id number] Patient Account Number: [id number] Date of Birth/Sex: 1951-02-26 (71 y.o. M) Treating RN: [name] Primary Care Deyra Perdomo: Fontaine No Other Clinician: Referring Floretta Petro:  Fontaine No Treating Ehsan Corvin/Extender: Tito Dine in Treatment: 34 Clinic Level of Care Assessment Items TOOL 4 Quantity Score X - Use when only an EandM is performed on FOLLOW-UP visit 1 0 ASSESSMENTS - Nursing Assessment / Reassessment X - Reassessment of Co-morbidities (includes updates in patient status) 1 10 X- 1 5 Reassessment of Adherence to Treatment Plan ASSESSMENTS - Wound and Skin Assessment / Reassessment '[]'  - Simple Wound Assessment / Reassessment - one wound 0 '[]'  - 0 Complex Wound Assessment / Reassessment - multiple wounds '[]'  - 0 Dermatologic / Skin Assessment (not related to wound area) ASSESSMENTS - Focused Assessment '[]'  - Circumferential Edema Measurements - multi extremities 0 '[]'  - 0 Nutritional Assessment / Counseling / Intervention '[]'  - 0 Lower Extremity Assessment (monofilament, tuning fork, pulses) '[]'  - 0 Peripheral Arterial Disease Assessment (using hand held doppler) ASSESSMENTS - Ostomy and/or Continence Assessment and Care '[]'  - Incontinence Assessment and Management 0 '[]'  - 0 Ostomy Care Assessment and Management (repouching, etc.) PROCESS - Coordination of Care X - Simple Patient / Family Education for ongoing care 1 15 '[]'  - 0 Complex (extensive) Patient / Family Education for ongoing care '[]'  - 0 Staff obtains Programmer, systems, Records, Test Results / Process Orders '[]'  - 0 Staff telephones HHA, Nursing Homes / Clarify orders / etc '[]'  - 0 Routine Transfer to another Facility (non-emergent condition) '[]'  - 0 Routine Hospital Admission (non-emergent condition) '[]'  - 0 New Admissions / Biomedical engineer / Ordering NPWT, Apligraf, etc. '[]'  - 0 Emergency Hospital Admission (emergent condition) X- 1 10 Simple Discharge Coordination '[]'  - 0 Complex (extensive) Discharge Coordination PROCESS - Special Needs '[]'  - Pediatric / Minor Patient Management 0 '[]'  - 0 Isolation Patient Management '[]'  - 0 Hearing / Language / Visual special  needs '[]'  - 0 Assessment of Community assistance (transportation, D/C planning, etc.) '[]'  - 0 Additional assistance / Altered mentation '[]'  - 0 Support Surface(s) Assessment (bed, cushion, seat, etc.) INTERVENTIONS -  Wound Cleansing / Measurement AYYAN, SITES ([id number]) '[]'  - 0 Simple Wound Cleansing - one wound '[]'  - 0 Complex Wound Cleansing - multiple wounds '[]'  - 0 Wound Imaging (photographs - any number of wounds) '[]'  - 0 Wound Tracing (instead of photographs) '[]'  - 0 Simple Wound Measurement - one wound '[]'  - 0 Complex Wound Measurement - multiple wounds INTERVENTIONS - Wound Dressings '[]'  - Small Wound Dressing one or multiple wounds 0 '[]'  - 0 Medium Wound Dressing one or multiple wounds '[]'  - 0 Large Wound Dressing one or multiple wounds '[]'  - 0 Application of Medications - topical '[]'  - 0 Application of Medications - injection INTERVENTIONS - Miscellaneous '[]'  - External ear exam 0 '[]'  - 0 Specimen Collection (cultures, biopsies, blood, body fluids, etc.) '[]'  - 0 Specimen(s) / Culture(s) sent or taken to Lab for analysis '[]'  - 0 Patient Transfer (multiple staff / Civil Service fast streamer / Similar devices) '[]'  - 0 Simple Staple / Suture removal (25 or less) '[]'  - 0 Complex Staple / Suture removal (26 or more) '[]'  - 0 Hypo / Hyperglycemic Management (close monitor of Blood Glucose) '[]'  - 0 Ankle / Brachial Index (ABI) - do not check if billed separately X- 1 5 Vital Signs Has the patient been seen at the hospital within the last three years: Yes Total Score: 45 Level Of Care: New/Established - Level 2 Electronic Signature(s) Unsigned Entered ByCarlene Coria on 11/30/2021 15:59:37 Signature(s): Date(s): [name] ([id number]) -------------------------------------------------------------------------------- Encounter Discharge Information Details Patient Name: [name], Mooney Date of Service: 11/30/2021 11:00 AM Medical Record Number: [id number] Patient Account Number:  [id number] Date of Birth/Sex: [birth year] (71 y.o. M) Treating RN: [name] Primary Care Mariann Palo: Fontaine No Other Clinician: Referring Marelyn Rouser: Fontaine No Treating Kasen Adduci/Extender: Tito Dine in Treatment: 45 Encounter Discharge Information Items Discharge Condition: Stable Ambulatory Status: Cane Discharge Destination: Home Transportation: Private Auto Accompanied By: self Schedule Follow-up Appointment: Yes Clinical Summary of Care: Electronic Signature(s) Signed: 11/30/2021 4:01:04 PM By: [name] RN Entered By: [name] on 11/30/2021 16:01:04 [name] ([id number]) -------------------------------------------------------------------------------- Lower Extremity Assessment Details Patient Name: [name] Date of Service: 11/30/2021 11:00 AM Medical Record Number: [id number] Patient Account Number: [id number] Date of Birth/Sex: [birth year] (71 y.o. M) Treating RN: [name] Primary Care Dhriti Fales: Fontaine No Other Clinician: Referring Giavanni Zeitlin: Fontaine No Treating Porfiria Heinrich/Extender: Tito Dine in Treatment: 44 Edema Assessment Assessed: [Left: No] Patrice Paradise: No] [Left: Edema] [Right: :] Calf Left: Right: Point of Measurement: 38 cm From Medial Instep 38 cm 38 cm Ankle Left: Right: Point of Measurement: 14 cm From Medial Instep 31 cm 24 cm Vascular Assessment Pulses: Dorsalis Pedis Palpable: [Left:Yes] [Right:Yes] Electronic Signature(s) Signed: 11/30/2021 3:54:37 PM By: [name] RN Entered By: [name] on 11/30/2021 11:11:28 [name] ([id number]) -------------------------------------------------------------------------------- Multi Wound Chart Details Patient Name: [name] Date of Service: 11/30/2021 11:00 AM Medical Record Number: [id number] Patient Account Number: [id number] Date of Birth/Sex: 1951-02-03 (71 y.o. M) Treating RN: [name] Primary Care Addison Whidbee:  Fontaine No Other Clinician: Referring Chetan Mehring: Fontaine No Treating Shelly Spenser/Extender: Tito Dine in Treatment: 75 Vital Signs Height(in): Pulse(bpm): 65 Weight(lbs): 312 Blood Pressure(mmHg): 144/72 Body Mass Index(BMI): Temperature(F): 97.7 Respiratory Rate(breaths/min): 18 Wound Assessments Treatment Notes Electronic Signature(s) Signed: 11/30/2021 3:54:37 PM By: [name] RN Entered By: [name] on 11/30/2021 11:13:20 [name] ([id number]) -------------------------------------------------------------------------------- [place] Details Patient Name: [name] Date of Service: 11/30/2021 11:00 AM Medical Record Number: [id number] Patient Account Number: [id number] Date of Birth/Sex: 1950-05-21 (  71 y.o. M) Treating RN: [name] Primary Care Hilery Wintle: Fontaine No Other Clinician: Referring Trigo Winterbottom: Fontaine No Treating Ettamae Barkett/Extender: Tito Dine in Treatment: 66 Active Inactive Soft Tissue Infection Nursing Diagnoses: Impaired tissue integrity Knowledge deficit related to disease process and management Knowledge deficit related to home infection control: handwashing, handling of soiled dressings, supply storage Potential for infection: soft tissue Goals: Patient/caregiver will verbalize understanding of or measures to prevent infection and contamination in the home setting Date Initiated: 09/06/2021 Date Inactivated: 10/18/2021 Target Resolution Date: 09/06/2021 Goal Status: Met Patient's soft tissue infection will resolve Date Initiated: 09/06/2021 Date Inactivated: 10/18/2021 Target Resolution Date: 09/06/2021 Goal Status: Met Signs and symptoms of infection will be recognized early to allow for prompt treatment Date Initiated: 09/06/2021 Target Resolution Date: 11/28/2021 Goal Status: Active Interventions: Assess signs and symptoms of infection every visit Provide  education on infection Treatment Activities: Education provided on Infection : 11/23/2021 Systemic antibiotics : 09/06/2021 Notes: Electronic Signature(s) Signed: 11/30/2021 3:54:37 PM By: [name] RN Entered By: [name] on 11/30/2021 11:11:55 [name] ([id number]) -------------------------------------------------------------------------------- Pain Assessment Details Patient Name: [name] Date of Service: 11/30/2021 11:00 AM Medical Record Number: [id number] Patient Account Number: [id number] Date of Birth/Sex: 30-Apr-1951 (71 y.o. M) Treating RN: [name] Primary Care Ivey Nembhard: Fontaine No Other Clinician: Referring Anahla Bevis: Fontaine No Treating Dayln Tugwell/Extender: Tito Dine in Treatment: 13 Active Problems Location of Pain Severity and Description of Pain Patient Has Paino No Site Locations Pain Management and Medication Current Pain Management: Electronic Signature(s) Signed: 11/30/2021 3:54:37 PM By: [name] RN Entered By: [name] on 11/30/2021 11:10:09 [name] ([id number]) -------------------------------------------------------------------------------- Patient/Caregiver Education Details Patient Name: [name] Date of Service: 11/30/2021 11:00 AM Medical Record Number: [id number] Patient Account Number: [id number] Date of Birth/Gender: [birth year] (71 y.o. M) Treating RN: [name] Primary Care Physician: Fontaine No Other Clinician: Referring Physician: Fontaine No Treating Physician/Extender: Tito Dine in Treatment: 90 Education Assessment Education Provided To: Patient Education Topics Provided Infection: Methods: Explain/Verbal Responses: State content correctly Electronic Signature(s) Unsigned Entered By: [name] on 11/30/2021 16:00:06 Signature(s): Date(s): [name]  ([id number]) -------------------------------------------------------------------------------- [place] Details Patient Name: CLARKSON, ROSSELLI Date of Service: 11/30/2021 11:00 AM Medical Record Number: [id number] Patient Account Number: [id number] Date of Birth/Sex: May 29, 1950 (71 y.o. M) Treating RN: [name] Primary Care Wadie Mattie: Fontaine No Other Clinician: Referring Amberley Hamler: Fontaine No Treating Linnea Todisco/Extender: Tito Dine in Treatment: 68 Vital Signs Time Taken: 11:08 Temperature (F): 97.7 Weight (lbs): 312 Pulse (bpm): 65 Respiratory Rate (breaths/min): 18 Blood Pressure (mmHg): 144/72 Reference Range: 80 - 120 mg / dl Electronic Signature(s) Signed: 11/30/2021 3:54:37 PM By: [name] RN Entered By: [name] on 11/30/2021 11:09:29

## 2021-11-30 NOTE — Progress Notes (Signed)
DAKWAN, PRIDGEN ([id number]) Visit Report for 11/30/2021 HPI Details Patient Name: [name], [name] Date of Service: 11/30/2021 11:00 AM Medical Record Number: [id number] Patient Account Number: [id number] Date of Birth/Sex: March 26, 1951 (71 y.o. M) Treating RN: [name] Primary Care Provider: Fontaine No Other Clinician: Referring Provider: Fontaine No Treating Provider/Extender: Tito Dine in Treatment: 58 History of Present Illness HPI Description: 71 year old male who presented to the ER with bilateral lower extremity blisters which had started last week. he has a past medical history of leukemia, diabetes mellitus, hypertension, edema of both lower extremities, his recurrent skin infections, peripheral vascular disease, coronary artery disease, congestive heart failure and peripheral neuropathy. in the ER he was given Rocephin and put on Silvadene cream. he was put on oral doxycycline and was asked to follow-up with the [facility]. His last hemoglobin A1c was 6.6 in December and he checks his blood sugar once a week. He does not have any physicians outside the [place] system. He does not recall any vascular duplex studies done either for arterial or venous disease but was told to wear compression stockings which he does not use 05/30/2016 -- we have not yet received any of his notes from the [facility] hospital system and his arterial and venous duplex studies are scheduled here in [place] around mid February. We are unable to have his insurance accepted by home health agencies and hence he is getting dressings only once a week. 06/06/16 -- -- I received a call from the patient's PCP at the [facility] and spoke to Dr. Garvin Fila, phone number [phone] and fax number [phone]. She confirmed that no vascular testing was done over the last 5 years and she would be happy to do them if the patient did want them to be done at the [place] and we could fax him a  request. Readmission: 71 year old male seen by as in February of this year and was referred to vein and vascular for studies and opinion from the vascular surgeons. The patient returns today with a fresh problem having had blisters on his left lower extremity which have been there for about 5 days and he clearly states that he has been wearing his compression stockings as advised though he could not read the moderate compression and has been wearing light compression. Review of his electronic medical records note that he had lower extremity arterial duplex examination done on 06/23/2016 which showed no hemodynamically significant stenosis in the bilateral lower extremity arterial system. He also had a lower extremity venous reflux examination done on 07/07/2016 and it was noted that he had venous incompetence in the right great saphenous vein and bilateral common femoral veins. Patient was seen by Dr. Tamala Julian on the same day and for some reason his notes do not reflect the venous studies or the arterial studies and he recommended patient do a venous duplex ultrasound to look for reflux and return to see him.he would also consider a lymph pump if required. The patient was told that his workup was normal and hence the patient canceled his follow-up appointment. 02/03/17 on evaluation today patient left medial lower extremity blister appears to be doing about the same. It is still continuing to drain and there's still the blistered skin covering the wound bed which is making it difficult for the alternate to do its job. Fortunately there is no evidence of cellulitis. No fevers chills noted. Patient states in general he is not having any significant discomfort. Patient's lower extremity arterial duplex exam  revealed that patient was hemodynamically stable with no evidence of stenosis in regard to the bilateral lower extremities. The lower extremity venous reflux exam revealed the patient had venous  incontinence noted in the right greater saphenous and bilateral common femoral vein. There is no evidence of deep or superficial vein thrombosis in the bilateral lower extremities. Readmission: 11/12/18 Patient presents for evaluation our clinic today concerning issues that he is having with his left lower extremity. He tells me that a couple weeks ago he began developing blisters on the left lower extremity along with increased swelling. He typically wears his compression stockings on a regular basis is previously been evaluated both here as well is with vascular surgery they would recommend lymphedema pumps but unfortunately that somehow fell through and he never heard anything back from that. Nonetheless I think lymphedema pumps would be beneficial for this patient. He does have a history of hypertension and diabetes. Obviously the chronic venous stasis and lymphedema as well. At this point the blisters have been given in more trouble he states sometimes when the blisters openings able to clean it down with alcohol and it will dry out and do well. Unfortunately that has not been the case this time. He is having some discomfort although this mean these with cleaning the areas he doesn't have discomfort just on a regular basis. He has not been able to wear his compression stockings since the blisters arose due to the fact that of course it will drain into the socks causing additional issues and he didn't have any way to wrap this otherwise. He has increased to taking his Lasix every day instead of every other day. He sees his primary care provider later this month as well. No fevers, chills, nausea, or vomiting noted at this time. 11/19/18-Patient returns at 1 week, per intake RN the amount of seepage into the compression wraps was definitely improved, overall all the wounds are measuring smaller but continuing silver alginate to the wounds as primary dressing 11/26/18 on evaluation today patient  appears to be doing quite well in regard to his left lower [place] ulcers. In fact of the areas that were noted initially he only has two regions still open. There is no evidence of active infection at this time. He still is not heard anything from the company regarding lymphedema pumps as of yet. Again as previously seen vascular they have not recommended any surgical intervention. 12/03/2018 on evaluation today patient actually appears to be doing quite well with regard to his lower extremity ulcers. In fact most of the areas [place], [place] ([id number]) appear to be healed the one spot which does not seem to be completely healed I am unsure of whether or not this is really draining that much but nonetheless there does not appear to be any signs of infection or significant drainage at this point. There is no sign of fever, chills, nausea, vomiting, or diarrhea. Overall I am pleased with how things have progressed I think is very close to being able to transition to his home compression stockings. 12/10/2018 upon evaluation today patient appears to be doing quite well with regard to his left lower extremity. He has been tolerating the dressing changes without complication. Fortunately there is no signs of active infection at this time. He appears after thorough evaluation of his leg to only have 1 small area that remains open at this point everything else appears to be almost completely closed. He still have significant swelling of the left lower  extremity. We had discussed discussing this with his primary care provider he is not able to see her in person they were at the [facility] and right now the [place] is not seeing patients on site. According to the patient anyway. Subsequently he did speak with her apparently and his primary care provider feels that he may likely have a DVT. With that being said she has not seen his leg she is just going off of his history. Nonetheless that is a concern that the  patient now has as well and while I do not feel the DVT is likely we can definitely ensure that that is not the case I will go ahead and see about putting that order in today. Nonetheless otherwise I am in a recommend that we continue with the current wound care measures including the compression therapy most likely. We just need to ensure that his leg is indeed free of any DVTs. 12/17/2018 on evaluation today patient actually appears to be completely healed today. He does have 2 very small areas of blistering although this is not anything too significant at this point which is good news. With that being said I am in agreement with the fact that I think he is completely healed at this point. He does want to get back into his compression stocking. The good news is we have gotten approval from insurance for his lymphedema pumps we received a letter since last saw him last week. The other good news is his study did come back and showed no evidence of a DVT. 12/20/2018 on evaluation today patient presents for follow-up concerning his ongoing issues with his left lower extremity. He was actually discharged last Friday and did fairly well until he states blisters opened this morning. He tells me he has been wearing his compression stocking although he has a hard time getting this on. There does not appear to be any signs of active infection at this time. No fevers, chills, nausea, vomiting, or diarrhea. 12/27/2018 on evaluation today patient appears to be doing very well with regard to his swelling of the left lower extremity the 4 layer compression wrap seems to have been beneficial for him. Fortunately there is no signs of active infection at this time. Patient has been tolerating the compression wrap without complication and his foot swelling in particular appears to be greatly improved. He does still have a wound on the lateral portion of his left leg I believe this is more of a blister that has now  reopened. 01/03/2019 on evaluation today patient actually appears to be doing excellent in regard to his left lower extremity. He did receive his compression pumps and is actually use this 7 times since he was last here in the office. On top of the compression wrap he is now roughly 3 cm better at the calf and 2 cm better at the ankle he also states that his foot seem to go an issue better without even having to use a shoe horn. Obviously I think this is all evidence that he is doing excellent in this regard. The other good news is he does not appear to have anything open today as far as wounds are concerned. 01/15/2019 on evaluation today patient appears to be doing more poorly yet again with regard to his left lower extremity. He has developed new wounds again after being discharged just recently. Unfortunately this continues to be the case that he will heal and then have subsequent new wounds. The last  time I was hopeful that he may not end up coming back too quickly especially since he states he has been using his lymphedema pumps along with wearing his compression. Nonetheless he had a blister on the back of his leg that popped up on the left and this has opened up into an ulceration it is quite painful. 01/22/19 on evaluation today patient actually appears to be doing well with regard to his wound on the left lower extremity. He's been tolerating the dressing changes without complication including the compression wrap in the wound appears to be significantly smaller today which is great news. Overall very pleased in this regard. 01/29/2019 on evaluation today patient appears to be doing well with regard to his left posterior lower extremity ulcer. He has been tolerating the dressing changes without complication. This is not completely healed but is getting much closer. We did order a Farrow wrap 4000 for him he has received this and has it with him today although I am not sure we are quite ready to  start him on that as of yet. We are very close. 02/05/2019 on evaluation today patient actually appears to be doing quite well with regard to his left posterior lower extremity ulcer. He still has a very tiny opening remaining but the fortunate thing is he seems to be healing quite nicely. He also did get his Farrow wrap which I am hoping will help with his edema control as well at home. Fortunately there is no evidence of active infection. 02/12/2019 patient and fortunately appears to be doing poorly in regard to his wounds of the left lower extremity. He was very close to healing therefore we attempted to use his Velcro compression wraps continuing with lymphedema pumps at home. Unfortunately that does not seem to have done very well for him. He tells me that he wore them all the time but again I am not sure why if that is the case that he is having such significant edema. He is still on his fluid pills as well. With that being said there is no obvious sign of infection although I do wonder about the possibility of infection at this time as well. 02/19/2019 unfortunately upon evaluation today patient appears to be doing more poorly with regard to his left lower extremity. He is not showing signs of significant improvement and I think the biggest issue here is that he does have an infection that appears to likely be Pseudomonas. That is based on the blue-green drainage that were noted at this time. Unfortunately the antibiotic that has been on is not going to take care of this at all. I think they will get a need to switch him to either Levaquin or Cipro and this was discussed with the patient. 02/26/2019 on evaluation today patient's lower extremity on the left appears to be doing significantly better as compared to last evaluation. Fortunately there is no signs of active infection at this time. He has been tolerating the compression wrap without complication in fact he made it the whole week at this  point. He is showing signs of excellent improvement I am very happy in this regard. With that being said he is having some issues with infection we did review the results of his culture which I noted today. He did have a positive finding for Enterobacter as well as Alcaligenes faecalis. Fortunately the Levaquin that I placed him on will work for both which is great news. There is no signs of systemic infection  at this point. 10/30; left posterior leg wound in the setting of very significant edema and what looks like chronic venous inflammation. He has compression pumps but does not use them. We have been using 3 layer compression. Silver alginate to the wound as the primary dressing 03/18/2019 on evaluation today patient appears to be doing a little better compared to last time I saw him. He really has not been using his compression pumps he tells me that he is having too much discomfort. He has been keeping his wraps on however. He is only been taking his fluid pills every other day because he states they are not really helping and he has an appointment with his primary care provider at the [facility] tomorrow. Subsequently the wound itself on the left lower extremity does seem to be greatly improved compared to previous. DONNEY, CARAVEO ([id number]) 03/25/2019 on evaluation today patient appears to be doing better with regard to his wounds on the bilateral lower extremities. The left is doing excellent the right is also doing better although both still do show some signs of open wounds noted at this point unfortunately. Fortunately there is no signs of active infection at this time. The patient also is not really having any significant pain which is good news. Unfortunately there was some confusion with the referral on vascular disease and as far as getting the patient scheduled there can be contacting him later today to do this fortunately we got this straightened out. 04/01/2019 on evaluation today patient  appears to be doing no fevers, chills, nausea, vomiting, or diarrhea. Excellent at this time with regard to his lower extremities. There does not appear to be any open wound at this point which is good news. Fortunately is also no signs of active infection at this time. Overall feel like the patient has done excellent with the compression the problem is every time we got him to this point and then subsequently go to using his own compression things just go right back to where they were. I am not sure how to address this we can try to get an appointment with vascular for 2 weeks now they have yet to call him. Obviously this has become frustrating for the patient as well. I think the issue has just been an honest error as far as scheduling is concerned but nonetheless still worn out the point where I am unsure of which direction we should take. 04/08/2019 on evaluation today patient actually appears to be doing well with regard to his lower extremities. There are no open wounds at this time and things seem to be managing quite nicely as far as the overall edema control is concerned. With that being said he does have his compression socks today for [place] to go ahead and reinitiate therapy in that manner at this point. He is going to be going for shoes to be measured on Wednesday and then coincidentally he will also be seeing vascular on Thursday. Overall I think this is good news and again I am hopeful that they will be able to do something for him to help prevent ongoing issues with edema control as well. No fevers, chills, nausea, vomiting, or diarrhea. 04/11/2019 on evaluation today patient actually appears to be doing poorly after just being discharged on Monday of this week. He had been experiencing issues with again blisters especially on the left lower extremity. With that being said he was completely healed and appeared to be doing great this past Monday. He then  subsequently has new blisters that  formed before his appointment with vascular this morning. He was also measured for shoes in the interim. With that being said we may have figured out what exactly is going on and why he continues to have issues like what we are seeing at this point. He takes his compression stockings off at nighttime and then he ends up having to sleep in his chair for 5-6 hours a night. He sleeps with his feet down he cannot really get him up in the recliner and therefore he is sleeping and the worst possible his position with his feet on the floor for that majority of the time. Again as I explained to him that is about one third at minimum at least one fourth of his day that he spending with his feet dangling down on the ground and the worst possible position they could be. I think this may be what is causing the issue. Subsequently I am leaning toward thinking that he may need a hospital bed in order to elevate his legs. We likely can have to coordinate this with his primary care provider at the [facility]. Readmission: 01/26/2021 this is a patient who presents for repeat evaluation here in the clinic although it is actually been couple of years since have seen him in fact it was December 2020 when I last saw him. Subsequently he never really healed but did end up being lost to follow-up. He tells me has been having issues ongoing with his lower extremities has bilateral lower extremity lymphedema no real significant or definitive open wounds but in general his lymphedema is way out of control. We were never able to refer him to lymphedema clinic simply due to the fact to be honest we were never able to get him completely healed. I do not see anyone with open wounds. The patient does have evidence of type 2 diabetes mellitus, lymphedema, chronic venous insufficiency, and hypertension. That really has not changed since his last evaluation. 02/09/2021 upon evaluation today patient appears to be doing a little better  in regard to his legs although he still having a tremendous amount of drainage especially on the left leg. Fortunately there does not appear to be any evidence of active infection. Of note when we looked into this further it appears that the patient did not have any absorptive dressing on it was just the 4-layer compression wrap. Nonetheless this is probably big part of the issue here. 10/10; he comes in today with 3 large areas on the upper right lower leg likely remanence of denuded blistering under his compression wraps. He has no other wounds on the right. On the left he has the denuded area on the left medial foot and ankle and on the left dorsal foot. Massive lymphedema in both feet dorsally. Using Zetuvit under compression We have increased home health visitation to twice a week to change the dressings and will change it once 02/22/2021 upon evaluation today patient appears to be doing well currently with regard to his wounds. He has been tolerating the dressing changes without complication. Fortunately there does not appear to be any evidence of active infection which is great news. No fevers, chills, nausea, vomiting, or diarrhea. The biggest issue I see currently is that home health is not putting any medicine on the actual wounds before wrapping. 03/01/2021 upon evaluation today the patient's right leg actually appears to be doing quite well which is great news there does not appear to be  any evidence of active infection at this time. No fevers, chills, nausea, vomiting, or diarrhea. With that being said the patient is having issues on the left foot where he is having significant drainage is also an ammonia smell he does not have any animals at home and this makes me concerned about a bacteria producing urea as a byproduct. Again the possible common organisms will be E. coli, Proteus, and Enterococcus. All 3 of which can be successfully treated with Levaquin. For that reason I think that  this may be a good option for [place] to consider placing him on and I did obtain a culture as well for confirmation sake. 03/08/2021 upon evaluation today patient appears to be doing unfortunately still somewhat poorly in regard to his leg ulcerations. He actually has an area on the right leg where he blistered due to the fact that his wrap slid down and caused an area of pinching on his skin and this has led to a significant issue here. 03/15/2021 upon evaluation today patient unfortunately has not been wrapped appropriately with absorptive dressings nor with the appropriate technique for the third layer of the 4-layer compression wrap. These are issues that we continue to try to address with the home health nurse. Also the absorptive dressing that she had was cut in half and therefore that causes things to leak out it does not actually trap the fluid in regard to the top of the foot overall I think that all these combined are really not seeing things improved significantly here. Fortunately there does not appear to be any signs of significant infection at this time which is good news. He still is having a tremendous amount of drainage. 03/22/2021 upon evaluation today patient appears to be draining tremendously. He still continues to tell me that he is using his pumps 2 times a day and that coupled with that tells me that he is elevating his legs as well. With that being said all things considered I am really just not seeing the improvement we would expect to see with the 4-layer compression wrap and all the above noted. He in fact had an extremely large Zetuvit dressing on both legs and that they were extremely filled to the max with fluid. This is after just being changed just before the weekend and this is Monday. Nonetheless I am concerned about the fact that there is something going on fairly significant that we cannot get any of this under [place], [name] ([id number]) control and that he is draining  this significantly. He supposed be having an echocardiogram it sounds like scheduling has been an issue for him as far as getting in sooner. Its something to do with needing his cousin to drive him because of where it sat and he cannot drive himself to this appointment either way I really think he needs to try to see what he can do about making this happen a little sooner. He tells me he will call today. 03/29/2021 on evaluation today patient appears to be doing about the same in regard to his legs. He did get his cardiology appointment moved up to 6 December which is at least good that is better than what it was before mid December. Overall very pleased in that regard. 04/05/2021 upon evaluation today patient unfortunately is still doing fairly poorly. There does not appear to be any signs of active infection at this time. No fevers, chills, nausea, vomiting, or diarrhea. Unfortunately I think until his edema is under control and  overall fluid overload there is really not to be much chance that I can do much to get him better. This is quite unfortunate and frustrating both for myself and the patient to be perfectly honest. Nonetheless I think that he really needs to have a conversation both with his primary care provider as well as cardiologist he sees the PCP on Monday and cardiology on Wednesday of next week. 04/13/2021 upon evaluation today patient appears to be doing poorly in regard to his bilateral lower extremities his left is still worse than the right. With that being said he has a tremendous amount of drainage he did see his primary care provider yesterday there really was not much there to be done from their perspective. He sees cardiology tomorrow. Nonetheless my biggest concern here is simply that if we do not get the edema under control he is going to continue to have drainage and honestly I think at some point he is going to become infected severely that is my main concern. 04/19/2021  upon evaluation today patient appears to be doing poorly still in regard to his legs. Unfortunately there does not appear to be any signs of infection at this point. He does have a tremendous amount of drainage however. We have not seen the results back from the cardiologist and the echocardiogram that was done. It appears that the patient checked out okay as far as that is concerned with regard to ejection fraction though we still have some issues here to be honest with his diastolic function. I am unsure if this is accounting for everything that we are seeing or not. Either way he has a tremendous amount of drainage from his legs that we are just not able to control in the outpatient setting at this point. I have reached out to Dr. Rockey Situ his cardiologist to see once he reviews the sheet if there is anything that he feels like can be done from an outpatient perspective if not then I think the way to go is probably can to be through inpatient admission and diuresis. Otherwise I am not sure how working to get this under control we tried antibiotics, compression wrapping, and I have told the patient to be elevating his legs I am not sure how much he does of this but either way I think that this is still an ongoing issue nonetheless. 04/26/2021 upon evaluation today patient appears to be doing poorly in regard to his legs. He is having a tremendous amount of fluid at this point which is quite unfortunate. Its to the point that he may have had at least 5 to 10 pounds of fluid in his dressings this morning when they were removed these were changed this Friday. Subsequently I think he needs to go to the ER for further evaluation and treatment I think is probably can need diuresis possibly even IV antibiotics been on what the blood work looks like but in general I feel like he needs something to get this under control from an outpatient perspective absent of everywhere I can think of and I cannot get this  under control with our traditional measures. I think this is going require more so that we can get him better 12/30; this is a patient with severe bilateral lymphedema. He was hospitalized from 04/26/2021 through 04/29/2021 treated for cellulitis in the setting of lower extremity ulcers and lymphedema. After he left the hospital he is apparently seen for nurse visit our staff contacted cardiology and he has been  started on Lasix 40 mg. Apparently his legs have less edema. Lab work from 05/04/2021 showed a BUN of 38 and creatinine of 1.59 these are elevated versus previous where his creatinine seems to have been 1.30 on 12/19 his potassium is 4.3. I believe the lab work is being followed by cardiology We have him in a 4-layer wrap. Xeroform on the leg wounds and sit to fit on the Berry damage skin on the left dorsal foot versus right dorsal foot. He has compression pumps but does not use them. We have apparently not yet ordered him compression stockings 05/17/2021 upon evaluation today patient's legs though better than last time I personally saw him appear to be getting worse compared to where they were previous. Dr. Quentin Cornwall was actually last 1 to see you I have not seen him since 19 December. That was before he went into the ER. Coming out apparently his legs looked also and they still look better but not as good as they were in the past. 1/16; patient with severe bilateral lymphedema. Severe scaled hyperkeratotic skin on the dorsal aspect of his distal left foot and left medial ankle.. On the right side changes are not as bad. He did not have any weeping edema. Our intake nurse was convinced that he is being compliant with compression pumps 1 hour twice a day 05/31/2021 upon evaluation today patient actually appears to be doing a little bit better in my opinion in regard to his feet. I do not see as much drainage and it being just completely wet as it was previous. Fortunately I do not also see any  signs of active infection which is great news as well. 06/07/21 Upon inspection patient's wound bed actually showed signs of doing well he is not nearly as weepy and wet as he has been in the past and overall very pleased in that regard. Fortunately I do not see any signs of active infection locally or nor systemically at this time. Which is great news. No fevers, chills, nausea, vomiting, or diarrhea. 06/14/2021 upon evaluation today patient appears to be doing well with regard to his right foot I am pleased in that regard. His left foot is still draining quite a bit despite using lymphedema pumps, 4-layer compression wraps, and he tells me elevating his legs as well. He also has Lasix that he takes twice a day. Nonetheless I believe that this is still good to be an ongoing issue. We have a hard time getting this under control as far as the swelling is concerned. 06/21/2021 upon evaluation today patient appears to be doing decently well in regard to his wounds all things considered. He still has a tremendous amount of drainage and fluid noted at this point. Fortunately I do not see any signs of active infection locally or systemically at this point which is great news. Nonetheless I am unsure where to go and how to do this as far as trying to limit his swelling and weeping from his toes in particular. 06/28/2021 upon evaluation patient unfortunately continues to have significant drainage from his feet. We have been keeping him in a compression wrap and despite this he still continues to have extreme fluid issues he seen his cardiologist he is seeing the nephrologist. We really cannot find any way to get this under control when he did well was when he was in the hospital and they got some of the fluid away. But outside of that we are just struggling to achieve the long-term  goal of getting this under control and keeping it under control unfortunately. 07/05/2021 upon evaluation today patient appears to be  doing poorly in regard to his feet. Unfortunately this continues to be a significant issue and to be honest I am really not certain what to do about it. I referred him to Dr. Randol Kern at [facility] and he does have an appointment although it is 12 April. He also sees his primary care provider on 6 April. He did not want to see Dr. Haynes Kerns until after he saw his PCP that is the reason the PANFILO, KETCHUM ([id number]) appointment so far out. There is really not much I can do in that regard. Nonetheless I do think that we are still continue to have significant lymphedema issues with significant mount of weeping in regard to the feet and again this has just become extremely difficult to manage to be honest I am not sure if there is something else that Dr. Haynes Kerns or someone else could recommend he also will be seeing Dr. Dellia Nims in 2 weeks when I am on vacation and at that time I will see if Dr. Dellia Nims has any ideas about where to go from here in the meantime. 07/12/2021 upon evaluation today patient appears to be doing about as well as can be expected with regard to his feet. He does actually see his kidney doctor this Friday. He also will be seeing his primary care provider on April 4 and then following that around mid April he will be seeing Dr. Haynes Kerns at [facility] which was a referral made for him. Again my goal is to try to find out some way to fix this and to be perfectly honest we have had some issues with making any good adjustments. When he was in the hospital and greater amounts of Lasix he was able to get this down and it looked much better upon discharge. With that being said right now things just are not doing nearly as good as what they used to be. 3/13; patient presents for follow-up. He has been using his lymphedema pumps over the past week. He reports an increase in his Lasix dose. He has no issues or complaints today. He denies signs of infection. 07/26/2021 upon evaluation today patient appears to be  doing better in regard to his feet bilaterally. Both are showing signs of much less drainage which is great news and overall very pleased in that regard. Fortunately there does not appear to be any evidence of active infection locally or systemically at this time. No fevers, chills, nausea, vomiting, or diarrhea. 08/02/2021 upon evaluation today patient appears to be doing well with regard to his feet. Both are showing signs of being drier the right pretty much has not really draining much at all which is great news. The left is not draining anywhere close to his much as it was during the last evaluation. This is excellent news and overall very pleased. 08/09/2021 upon evaluation today patient appears to be doing well with regard to his legs the right leg especially showing signs of excellent improvement which is great news I do not see any evidence of active infection locally or systemically which is great. In regard to the left leg he still has some weeping and drainage but nothing as significant as what it was in the past this is great news. 08-16-2021 upon evaluation today patient appears to actually be doing quite well in my opinion in regard to his feet. This is significantly improved compared  to what we previously seen and overall I am extremely pleased in that regard. I do believe that He is actually improving although this is obviously very slow going. 08-23-2021 upon evaluation today patient appears to be doing well currently in regard to his right leg which actually is pretty dry at this point today. Fortunately I do not see any evidence of active infection at this time which is great news. No fevers, chills, nausea, vomiting, or diarrhea. 08-30-2021 upon evaluation today patient appears to be doing well with regard to his lower extremities. The right foot is pretty much completely dry which is great news the left foot though not completely dry seems to be doing decently well. I do not see any  signs of active infection locally or systemically which is great news. No fevers, chills, nausea, vomiting, or diarrhea. 09-06-2021 upon evaluation today patient appears to be doing well with regard to his feet in fact now the left foot is almost completely dry as well and I am definitely seeing a lot of significant improvement. With that being said unfortunately he actually appears to have some cellulitis of his right thigh. His toes are also little bit red but this may just be due to the increased swelling. He really is not warm to touch in regard to the toes. 5/8; excellent edema control on the right foot and lower leg there is no open wounds but we continue to put compression on this otherwise this will breakdown. He is using his compression pumps twice a day at home The area that is problematic is on the left dorsal foot some areas that are not fully epithelialized with very dry fissured skin over this area. 09-20-2021 upon evaluation today patient appears to be doing a little bit worse in regard to swelling at this time. Fortunately I do not see any signs of infection with that being said the wrap was not on quite as well as what I would like to have seen. I do believe that this has caused a little bit of excess swelling and again we need to try to get this under good control. 09-27-2021 upon evaluation today patient appears to be doing awesome in regard to his feet and legs. Everything is measuring smaller the swelling is down and to be perfectly honest I am extremely pleased with the end of his feet especially on the left side and how dry this is today. I do think we are on the right track here. 5/31; patient presents for follow-up. He is using nystatin powder to the feet bilaterally under 4-layer compression. He has no issues or complaints today. He states he is going to the lymphedema clinic tomorrow. Since he has no open wounds on the right lower extremity they will be focused on the  side. 10-11-2021 upon evaluation today patient appears to be doing excellent in regard to his legs. He has been tolerating the dressing changes without complication. Fortunately there does not appear to be any evidence of active infection locally or systemically at this time which is great news. No fevers, chills, nausea, vomiting, or diarrhea. 10-18-2021 upon evaluation today patient appears to be doing well with regard to his legs. He has been tolerating the dressing changes without complication and actually seen in lymphedema clinic now in regard to his right leg were taken care of the left leg. Fortunately I do not see any evidence of active infection locally or systemically at this time. 10-26-2021 upon evaluation today patient's wounds actually are  showing signs of doing well in fact he really does not have wounds as much is weeping of the lower extremities. Fortunately I do not see any evidence of active infection locally or systemically which is great news. No fever or chills noted 11-02-2021 upon evaluation today patient appears to be doing decently well in regard to his legs. Of actually been on the phone quite a bit discussing with [facility] orthotics custom compression for him. I also have been discussing everything with his lymphedema clinic provider as well. Helene Kelp notes that there is really not much more that she can do for him which was noted last week as well. Subsequently I do believe that the patient would benefit from getting custom compression in fact I think that is what he is going require to keep anything under control here. He voiced understanding. 11-08-2021 upon evaluation today patient appears to be doing excellent in regard to his legs and feet. Fortunately I do not see any signs of infection at this time which is great news. No fever or chills noted again I am still been working on trying to figure out what exactly we need to do as far as getting the [place] involved in coverage for  custom compression for this patient. I also discussed with him that he does need to have custom shoes which I completely understand. With that being said it does make it difficult thinking about what kind of combination of shoes and compression is WASH, NIENHAUS ([id number]) going to be best for him for the long-term. Fortunately I do not see any evidence of active infection locally or systemically which is great news. Overall I think you are doing quite well. 11-16-2021 upon evaluation today patient appears to be doing well currently in regard to his legs and feet. Everything is significantly smaller compared to what has been. Fortunately I do not see any evidence of active infection locally or systemically at this time which is great news. 11-23-2021 upon evaluation today patient appears to be doing well with regard to his legs bilaterally. We can actually initiate treatment with a recommendation today to utilize Tubigrip and try to see if we can get this under better control. The patient is in agreement with the plan. Nonetheless the big question is whether this will be something that he can utilize at home in order to keep his swelling down and allow him to be able to function without having to come into the clinic as frequently. He notes that he would love to get to this point. He does have a cousin who is willing to help him with change out the Tubigrip when necessary. 7/25; the patient does not have any open wounds however his left leg is a lot more swollen than it was last week. His cousin came in who presumably would be the one to change his juxta lites to practice on doing this for the patient. She apparently lives in the [place] block. In the meantime he has been using Financial risk analyst) Signed: 11/30/2021 4:13:45 PM By: Linton Ham MD Entered By: Linton Ham on 11/30/2021 11:55:26 [name]  ([id number]) -------------------------------------------------------------------------------- Physical Exam Details Patient Name: [name] Date of Service: 11/30/2021 11:00 AM Medical Record Number: [id number] Patient Account Number: [id number] Date of Birth/Sex: 1951-04-10 (71 y.o. M) Treating RN: [name] Primary Care Provider: Fontaine No Other Clinician: Referring Provider: Fontaine No Treating Provider/Extender: Tito Dine in Treatment: 48 Constitutional Sitting or standing Blood Pressure is within target range for patient.Marland Kitchen  Pulse regular and within target range for patient.Marland Kitchen Respirations regular, non- labored and within target range.. Temperature is normal and within the target range for the patient.Marland Kitchen appears in no distress. Notes Wound exam; the patient has no open wounds however he has severe bilateral lymphedema left greater than right skin changes especially on the left dorsal foot and left medial ankle. Electronic Signature(s) Signed: 11/30/2021 4:13:45 PM By: Linton Ham MD Entered By: Linton Ham on 11/30/2021 11:56:56 [name] ([id number]) -------------------------------------------------------------------------------- Physician Orders Details Patient Name: [name] Date of Service: 11/30/2021 11:00 AM Medical Record Number: [id number] Patient Account Number: [id number] Date of Birth/Sex: 18-Jan-1951 (71 y.o. M) Treating RN: [name] Primary Care Provider: Fontaine No Other Clinician: Referring Provider: Fontaine No Treating Provider/Extender: Tito Dine in Treatment: 54 Verbal / Phone Orders: No Diagnosis Coding ICD-10 Coding Code Description E11.622 Type 2 diabetes mellitus with other skin ulcer I89.0 Lymphedema, not elsewhere classified I87.2 Venous insufficiency (chronic) (peripheral) L97.822 Non-pressure chronic ulcer of other part of left lower leg with fat layer  exposed L97.512 Non-pressure chronic ulcer of other part of right foot with fat layer exposed I10 Essential (primary) hypertension L97.811 Non-pressure chronic ulcer of other part of right lower leg limited to breakdown of skin Follow-up Appointments o Return Appointment in 1 week. o Nurse Visit as needed Bathing/ Shower/ Hygiene o May shower with wound dressing protected with water repellent cover or cast protector. o No tub bath. Edema Control - Lymphedema / Segmental Compressive Device / Other Bilateral Lower Extremities o Tubigrip double layer applied - Nystatin powder between toes double size d bilat just behind toes to patella knotch , (changed left to Double E due to swelling) double size C foot ankles enclose toes to just above ankle o Elevate, Exercise Daily and Avoid Standing for Long Periods of Time. o Elevate legs to the level of the heart and pump ankles as often as possible o Elevate leg(s) parallel to the floor when sitting. o Compression Pump: Use compression pump on left lower extremity for 60 minutes, twice daily. - 2 times per day o DO YOUR BEST to sleep in the bed at night. DO NOT sleep in your recliner. Long hours of sitting in a recliner leads to swelling of the legs and/or potential wounds on your backside. o Other: - Contact prescriber regarding use of diuretics to reduce fluid overload. Off-Loading o Turn and reposition every 2 hours Additional Orders / Instructions o Follow Nutritious Diet and Increase Protein Intake Medications-Please add to medication list. o P.O. Antibiotics Electronic Signature(s) Signed: 11/30/2021 3:58:32 PM By: [name] RN Signed: 11/30/2021 4:13:45 PM By: Linton Ham MD Entered By: [name] on 11/30/2021 15:58:32 [name] ([id number]) -------------------------------------------------------------------------------- Problem List Details Patient Name: [name] Date of Service: 11/30/2021  11:00 AM Medical Record Number: [id number] Patient Account Number: [id number] Date of Birth/Sex: 1951/04/23 (71 y.o. M) Treating RN: [name] Primary Care Provider: Fontaine No Other Clinician: Referring Provider: Fontaine No Treating Provider/Extender: Tito Dine in Treatment: 74 Active Problems ICD-10 Encounter Code Description Active Date MDM Diagnosis E11.622 Type 2 diabetes mellitus with other skin ulcer 01/26/2021 No Yes I89.0 Lymphedema, not elsewhere classified 01/26/2021 No Yes I87.2 Venous insufficiency (chronic) (peripheral) 01/26/2021 No Yes L97.822 Non-pressure chronic ulcer of other part of left lower leg with fat layer 01/26/2021 No Yes exposed L97.512 Non-pressure chronic ulcer of other part of right foot with fat layer 01/26/2021 No Yes exposed [place] (primary) hypertension 01/26/2021 No Yes L97.811 Non-pressure  chronic ulcer of other part of right lower leg limited to 02/15/2021 No Yes breakdown of skin Inactive Problems Resolved Problems Electronic Signature(s) Signed: 11/30/2021 4:13:45 PM By: Linton Ham MD Entered By: Linton Ham on 11/30/2021 11:52:25 [name] ([id number]) -------------------------------------------------------------------------------- Progress Note Details Patient Name: [name] Date of Service: 11/30/2021 11:00 AM Medical Record Number: [id number] Patient Account Number: [id number] Date of Birth/Sex: Oct 31, 1950 (71 y.o. M) Treating RN: [name] Primary Care Provider: Fontaine No Other Clinician: Referring Provider: Fontaine No Treating Provider/Extender: Tito Dine in Treatment: 38 Subjective History of Present Illness (HPI) 71 year old male who presented to the ER with bilateral lower extremity blisters which had started last week. he has a past medical history of leukemia, diabetes mellitus, hypertension, edema of both lower extremities, his  recurrent skin infections, peripheral vascular disease, coronary artery disease, congestive heart failure and peripheral neuropathy. in the ER he was given Rocephin and put on Silvadene cream. he was put on oral doxycycline and was asked to follow-up with the [facility]. His last hemoglobin A1c was 6.6 in December and he checks his blood sugar once a week. He does not have any physicians outside the [place] system. He does not recall any vascular duplex studies done either for arterial or venous disease but was told to wear compression stockings which he does not use 05/30/2016 -- we have not yet received any of his notes from the [facility] hospital system and his arterial and venous duplex studies are scheduled here in [place] around mid February. We are unable to have his insurance accepted by home health agencies and hence he is getting dressings only once a week. 06/06/16 -- -- I received a call from the patient's PCP at the [facility] at [facility] and spoke to Dr. Garvin Fila, phone number [phone] and fax number [phone]. She confirmed that no vascular testing was done over the last 5 years and she would be happy to do them if the patient did want them to be done at the [place] and we could fax him a request. Readmission: 71 year old male seen by as in February of this year and was referred to vein and vascular for studies and opinion from the vascular surgeons. The patient returns today with a fresh problem having had blisters on his left lower extremity which have been there for about 5 days and he clearly states that he has been wearing his compression stockings as advised though he could not read the moderate compression and has been wearing light compression. Review of his electronic medical records note that he had lower extremity arterial duplex examination done on 06/23/2016 which showed no hemodynamically significant stenosis in the bilateral lower extremity arterial system. He also had a lower  extremity venous reflux examination done on 07/07/2016 and it was noted that he had venous incompetence in the right great saphenous vein and bilateral common femoral veins. Patient was seen by Dr. Tamala Julian on the same day and for some reason his notes do not reflect the venous studies or the arterial studies and he recommended patient do a venous duplex ultrasound to look for reflux and return to see him.he would also consider a lymph pump if required. The patient was told that his workup was normal and hence the patient canceled his follow-up appointment. 02/03/17 on evaluation today patient left medial lower extremity blister appears to be doing about the same. It is still continuing to drain and there's still the blistered skin covering the wound bed which is making  it difficult for the alternate to do its job. Fortunately there is no evidence of cellulitis. No fevers chills noted. Patient states in general he is not having any significant discomfort. Patient's lower extremity arterial duplex exam revealed that patient was hemodynamically stable with no evidence of stenosis in regard to the bilateral lower extremities. The lower extremity venous reflux exam revealed the patient had venous incontinence noted in the right greater saphenous and bilateral common femoral vein. There is no evidence of deep or superficial vein thrombosis in the bilateral lower extremities. Readmission: 11/12/18 Patient presents for evaluation our clinic today concerning issues that he is having with his left lower extremity. He tells me that a couple weeks ago he began developing blisters on the left lower extremity along with increased swelling. He typically wears his compression stockings on a regular basis is previously been evaluated both here as well is with vascular surgery they would recommend lymphedema pumps but unfortunately that somehow fell through and he never heard anything back from that. Nonetheless I think  lymphedema pumps would be beneficial for this patient. He does have a history of hypertension and diabetes. Obviously the chronic venous stasis and lymphedema as well. At this point the blisters have been given in more trouble he states sometimes when the blisters openings able to clean it down with alcohol and it will dry out and do well. Unfortunately that has not been the case this time. He is having some discomfort although this mean these with cleaning the areas he doesn't have discomfort just on a regular basis. He has not been able to wear his compression stockings since the blisters arose due to the fact that of course it will drain into the socks causing additional issues and he didn't have any way to wrap this otherwise. He has increased to taking his Lasix every day instead of every other day. He sees his primary care provider later this month as well. No fevers, chills, nausea, or vomiting noted at this time. 11/19/18-Patient returns at 1 week, per intake RN the amount of seepage into the compression wraps was definitely improved, overall all the wounds are measuring smaller but continuing silver alginate to the wounds as primary dressing 11/26/18 on evaluation today patient appears to be doing quite well in regard to his left lower [place] ulcers. In fact of the areas that were noted initially he only has two regions still open. There is no evidence of active infection at this time. He still is not heard anything from the company regarding lymphedema pumps as of yet. Again as previously seen vascular they have not recommended any surgical intervention. 12/03/2018 on evaluation today patient actually appears to be doing quite well with regard to his lower extremity ulcers. In fact most of the areas appear to be healed the one spot which does not seem to be completely healed I am unsure of whether or not this is really draining that much but nonetheless there does not appear to be any signs of  infection or significant drainage at this point. There is no sign of fever, chills, nausea, vomiting, or diarrhea. Overall I am pleased with how things have progressed I think is very close to being able to transition to his home compression stockings. EDEM, TIEGS ([id number]) 12/10/2018 upon evaluation today patient appears to be doing quite well with regard to his left lower extremity. He has been tolerating the dressing changes without complication. Fortunately there is no signs of active infection at this time.  He appears after thorough evaluation of his leg to only have 1 small area that remains open at this point everything else appears to be almost completely closed. He still have significant swelling of the left lower extremity. We had discussed discussing this with his primary care provider he is not able to see her in person they were at the [facility] and right now the [place] is not seeing patients on site. According to the patient anyway. Subsequently he did speak with her apparently and his primary care provider feels that he may likely have a DVT. With that being said she has not seen his leg she is just going off of his history. Nonetheless that is a concern that the patient now has as well and while I do not feel the DVT is likely we can definitely ensure that that is not the case I will go ahead and see about putting that order in today. Nonetheless otherwise I am in a recommend that we continue with the current wound care measures including the compression therapy most likely. We just need to ensure that his leg is indeed free of any DVTs. 12/17/2018 on evaluation today patient actually appears to be completely healed today. He does have 2 very small areas of blistering although this is not anything too significant at this point which is good news. With that being said I am in agreement with the fact that I think he is completely healed at this point. He does want to get back into his  compression stocking. The good news is we have gotten approval from insurance for his lymphedema pumps we received a letter since last saw him last week. The other good news is his study did come back and showed no evidence of a DVT. 12/20/2018 on evaluation today patient presents for follow-up concerning his ongoing issues with his left lower extremity. He was actually discharged last Friday and did fairly well until he states blisters opened this morning. He tells me he has been wearing his compression stocking although he has a hard time getting this on. There does not appear to be any signs of active infection at this time. No fevers, chills, nausea, vomiting, or diarrhea. 12/27/2018 on evaluation today patient appears to be doing very well with regard to his swelling of the left lower extremity the 4 layer compression wrap seems to have been beneficial for him. Fortunately there is no signs of active infection at this time. Patient has been tolerating the compression wrap without complication and his foot swelling in particular appears to be greatly improved. He does still have a wound on the lateral portion of his left leg I believe this is more of a blister that has now reopened. 01/03/2019 on evaluation today patient actually appears to be doing excellent in regard to his left lower extremity. He did receive his compression pumps and is actually use this 7 times since he was last here in the office. On top of the compression wrap he is now roughly 3 cm better at the calf and 2 cm better at the ankle he also states that his foot seem to go an issue better without even having to use a shoe horn. Obviously I think this is all evidence that he is doing excellent in this regard. The other good news is he does not appear to have anything open today as far as wounds are concerned. 01/15/2019 on evaluation today patient appears to be doing more poorly yet again with  regard to his left lower extremity. He  has developed new wounds again after being discharged just recently. Unfortunately this continues to be the case that he will heal and then have subsequent new wounds. The last time I was hopeful that he may not end up coming back too quickly especially since he states he has been using his lymphedema pumps along with wearing his compression. Nonetheless he had a blister on the back of his leg that popped up on the left and this has opened up into an ulceration it is quite painful. 01/22/19 on evaluation today patient actually appears to be doing well with regard to his wound on the left lower extremity. He's been tolerating the dressing changes without complication including the compression wrap in the wound appears to be significantly smaller today which is great news. Overall very pleased in this regard. 01/29/2019 on evaluation today patient appears to be doing well with regard to his left posterior lower extremity ulcer. He has been tolerating the dressing changes without complication. This is not completely healed but is getting much closer. We did order a Farrow wrap 4000 for him he has received this and has it with him today although I am not sure we are quite ready to start him on that as of yet. We are very close. 02/05/2019 on evaluation today patient actually appears to be doing quite well with regard to his left posterior lower extremity ulcer. He still has a very tiny opening remaining but the fortunate thing is he seems to be healing quite nicely. He also did get his Farrow wrap which I am hoping will help with his edema control as well at home. Fortunately there is no evidence of active infection. 02/12/2019 patient and fortunately appears to be doing poorly in regard to his wounds of the left lower extremity. He was very close to healing therefore we attempted to use his Velcro compression wraps continuing with lymphedema pumps at home. Unfortunately that does not seem to have done very  well for him. He tells me that he wore them all the time but again I am not sure why if that is the case that he is having such significant edema. He is still on his fluid pills as well. With that being said there is no obvious sign of infection although I do wonder about the possibility of infection at this time as well. 02/19/2019 unfortunately upon evaluation today patient appears to be doing more poorly with regard to his left lower extremity. He is not showing signs of significant improvement and I think the biggest issue here is that he does have an infection that appears to likely be Pseudomonas. That is based on the blue-green drainage that were noted at this time. Unfortunately the antibiotic that has been on is not going to take care of this at all. I think they will get a need to switch him to either Levaquin or Cipro and this was discussed with the patient. 02/26/2019 on evaluation today patient's lower extremity on the left appears to be doing significantly better as compared to last evaluation. Fortunately there is no signs of active infection at this time. He has been tolerating the compression wrap without complication in fact he made it the whole week at this point. He is showing signs of excellent improvement I am very happy in this regard. With that being said he is having some issues with infection we did review the results of his culture which I noted today.  He did have a positive finding for Enterobacter as well as Alcaligenes faecalis. Fortunately the Levaquin that I placed him on will work for both which is great news. There is no signs of systemic infection at this point. 10/30; left posterior leg wound in the setting of very significant edema and what looks like chronic venous inflammation. He has compression pumps but does not use them. We have been using 3 layer compression. Silver alginate to the wound as the primary dressing 03/18/2019 on evaluation today patient appears  to be doing a little better compared to last time I saw him. He really has not been using his compression pumps he tells me that he is having too much discomfort. He has been keeping his wraps on however. He is only been taking his fluid pills every other day because he states they are not really helping and he has an appointment with his primary care provider at the [facility] tomorrow. Subsequently the wound itself on the left lower extremity does seem to be greatly improved compared to previous. 03/25/2019 on evaluation today patient appears to be doing better with regard to his wounds on the bilateral lower extremities. The left is doing excellent the right is also doing better although both still do show some signs of open wounds noted at this point unfortunately. Fortunately there is no signs of active infection at this time. The patient also is not really having any significant pain which is good news. Unfortunately there was some confusion with the referral on vascular disease and as far as getting the patient scheduled there can be contacting him later today to do this KETRICK, MATNEY ([id number]) fortunately we got this straightened out. 04/01/2019 on evaluation today patient appears to be doing no fevers, chills, nausea, vomiting, or diarrhea. Excellent at this time with regard to his lower extremities. There does not appear to be any open wound at this point which is good news. Fortunately is also no signs of active infection at this time. Overall feel like the patient has done excellent with the compression the problem is every time we got him to this point and then subsequently go to using his own compression things just go right back to where they were. I am not sure how to address this we can try to get an appointment with vascular for 2 weeks now they have yet to call him. Obviously this has become frustrating for the patient as well. I think the issue has just been an honest error as far as  scheduling is concerned but nonetheless still worn out the point where I am unsure of which direction we should take. 04/08/2019 on evaluation today patient actually appears to be doing well with regard to his lower extremities. There are no open wounds at this time and things seem to be managing quite nicely as far as the overall edema control is concerned. With that being said he does have his compression socks today for [place] to go ahead and reinitiate therapy in that manner at this point. He is going to be going for shoes to be measured on Wednesday and then coincidentally he will also be seeing vascular on Thursday. Overall I think this is good news and again I am hopeful that they will be able to do something for him to help prevent ongoing issues with edema control as well. No fevers, chills, nausea, vomiting, or diarrhea. 04/11/2019 on evaluation today patient actually appears to be doing poorly after just being discharged on  Monday of this week. He had been experiencing issues with again blisters especially on the left lower extremity. With that being said he was completely healed and appeared to be doing great this past Monday. He then subsequently has new blisters that formed before his appointment with vascular this morning. He was also measured for shoes in the interim. With that being said we may have figured out what exactly is going on and why he continues to have issues like what we are seeing at this point. He takes his compression stockings off at nighttime and then he ends up having to sleep in his chair for 5-6 hours a night. He sleeps with his feet down he cannot really get him up in the recliner and therefore he is sleeping and the worst possible his position with his feet on the floor for that majority of the time. Again as I explained to him that is about one third at minimum at least one fourth of his day that he spending with his feet dangling down on the ground and the worst  possible position they could be. I think this may be what is causing the issue. Subsequently I am leaning toward thinking that he may need a hospital bed in order to elevate his legs. We likely can have to coordinate this with his primary care provider at the [facility]. Readmission: 01/26/2021 this is a patient who presents for repeat evaluation here in the clinic although it is actually been couple of years since have seen him in fact it was December 2020 when I last saw him. Subsequently he never really healed but did end up being lost to follow-up. He tells me has been having issues ongoing with his lower extremities has bilateral lower extremity lymphedema no real significant or definitive open wounds but in general his lymphedema is way out of control. We were never able to refer him to lymphedema clinic simply due to the fact to be honest we were never able to get him completely healed. I do not see anyone with open wounds. The patient does have evidence of type 2 diabetes mellitus, lymphedema, chronic venous insufficiency, and hypertension. That really has not changed since his last evaluation. 02/09/2021 upon evaluation today patient appears to be doing a little better in regard to his legs although he still having a tremendous amount of drainage especially on the left leg. Fortunately there does not appear to be any evidence of active infection. Of note when we looked into this further it appears that the patient did not have any absorptive dressing on it was just the 4-layer compression wrap. Nonetheless this is probably big part of the issue here. 10/10; he comes in today with 3 large areas on the upper right lower leg likely remanence of denuded blistering under his compression wraps. He has no other wounds on the right. On the left he has the denuded area on the left medial foot and ankle and on the left dorsal foot. Massive lymphedema in both feet dorsally. Using Zetuvit under  compression We have increased home health visitation to twice a week to change the dressings and will change it once 02/22/2021 upon evaluation today patient appears to be doing well currently with regard to his wounds. He has been tolerating the dressing changes without complication. Fortunately there does not appear to be any evidence of active infection which is great news. No fevers, chills, nausea, vomiting, or diarrhea. The biggest issue I see currently is that home  health is not putting any medicine on the actual wounds before wrapping. 03/01/2021 upon evaluation today the patient's right leg actually appears to be doing quite well which is great news there does not appear to be any evidence of active infection at this time. No fevers, chills, nausea, vomiting, or diarrhea. With that being said the patient is having issues on the left foot where he is having significant drainage is also an ammonia smell he does not have any animals at home and this makes me concerned about a bacteria producing urea as a byproduct. Again the possible common organisms will be E. coli, Proteus, and Enterococcus. All 3 of which can be successfully treated with Levaquin. For that reason I think that this may be a good option for [place] to consider placing him on and I did obtain a culture as well for confirmation sake. 03/08/2021 upon evaluation today patient appears to be doing unfortunately still somewhat poorly in regard to his leg ulcerations. He actually has an area on the right leg where he blistered due to the fact that his wrap slid down and caused an area of pinching on his skin and this has led to a significant issue here. 03/15/2021 upon evaluation today patient unfortunately has not been wrapped appropriately with absorptive dressings nor with the appropriate technique for the third layer of the 4-layer compression wrap. These are issues that we continue to try to address with the home health nurse. Also  the absorptive dressing that she had was cut in half and therefore that causes things to leak out it does not actually trap the fluid in regard to the top of the foot overall I think that all these combined are really not seeing things improved significantly here. Fortunately there does not appear to be any signs of significant infection at this time which is good news. He still is having a tremendous amount of drainage. 03/22/2021 upon evaluation today patient appears to be draining tremendously. He still continues to tell me that he is using his pumps 2 times a day and that coupled with that tells me that he is elevating his legs as well. With that being said all things considered I am really just not seeing the improvement we would expect to see with the 4-layer compression wrap and all the above noted. He in fact had an extremely large Zetuvit dressing on both legs and that they were extremely filled to the max with fluid. This is after just being changed just before the weekend and this is Monday. Nonetheless I am concerned about the fact that there is something going on fairly significant that we cannot get any of this under control and that he is draining this significantly. He supposed be having an echocardiogram it sounds like scheduling has been an issue for him as far as getting in sooner. Its something to do with needing his cousin to drive him because of where it sat and he cannot drive himself to this appointment either way I really think he needs to try to see what he can do about making this happen a little sooner. He tells me he will call today. ABU, HEAVIN ([id number]) 03/29/2021 on evaluation today patient appears to be doing about the same in regard to his legs. He did get his cardiology appointment moved up to 6 December which is at least good that is better than what it was before mid December. Overall very pleased in that regard. 04/05/2021 upon evaluation today patient  unfortunately is still doing fairly poorly. There does not appear to be any signs of active infection at this time. No fevers, chills, nausea, vomiting, or diarrhea. Unfortunately I think until his edema is under control and overall fluid overload there is really not to be much chance that I can do much to get him better. This is quite unfortunate and frustrating both for myself and the patient to be perfectly honest. Nonetheless I think that he really needs to have a conversation both with his primary care provider as well as cardiologist he sees the PCP on Monday and cardiology on Wednesday of next week. 04/13/2021 upon evaluation today patient appears to be doing poorly in regard to his bilateral lower extremities his left is still worse than the right. With that being said he has a tremendous amount of drainage he did see his primary care provider yesterday there really was not much there to be done from their perspective. He sees cardiology tomorrow. Nonetheless my biggest concern here is simply that if we do not get the edema under control he is going to continue to have drainage and honestly I think at some point he is going to become infected severely that is my main concern. 04/19/2021 upon evaluation today patient appears to be doing poorly still in regard to his legs. Unfortunately there does not appear to be any signs of infection at this point. He does have a tremendous amount of drainage however. We have not seen the results back from the cardiologist and the echocardiogram that was done. It appears that the patient checked out okay as far as that is concerned with regard to ejection fraction though we still have some issues here to be honest with his diastolic function. I am unsure if this is accounting for everything that we are seeing or not. Either way he has a tremendous amount of drainage from his legs that we are just not able to control in the outpatient setting at this point. I  have reached out to Dr. Rockey Situ his cardiologist to see once he reviews the sheet if there is anything that he feels like can be done from an outpatient perspective if not then I think the way to go is probably can to be through inpatient admission and diuresis. Otherwise I am not sure how working to get this under control we tried antibiotics, compression wrapping, and I have told the patient to be elevating his legs I am not sure how much he does of this but either way I think that this is still an ongoing issue nonetheless. 04/26/2021 upon evaluation today patient appears to be doing poorly in regard to his legs. He is having a tremendous amount of fluid at this point which is quite unfortunate. Its to the point that he may have had at least 5 to 10 pounds of fluid in his dressings this morning when they were removed these were changed this Friday. Subsequently I think he needs to go to the ER for further evaluation and treatment I think is probably can need diuresis possibly even IV antibiotics been on what the blood work looks like but in general I feel like he needs something to get this under control from an outpatient perspective absent of everywhere I can think of and I cannot get this under control with our traditional measures. I think this is going require more so that we can get him better 12/30; this is a patient with severe bilateral lymphedema. He was hospitalized  from 04/26/2021 through 04/29/2021 treated for cellulitis in the setting of lower extremity ulcers and lymphedema. After he left the hospital he is apparently seen for nurse visit our staff contacted cardiology and he has been started on Lasix 40 mg. Apparently his legs have less edema. Lab work from 05/04/2021 showed a BUN of 38 and creatinine of 1.59 these are elevated versus previous where his creatinine seems to have been 1.30 on 12/19 his potassium is 4.3. I believe the lab work is being followed by cardiology We have him  in a 4-layer wrap. Xeroform on the leg wounds and sit to fit on the Berry damage skin on the left dorsal foot versus right dorsal foot. He has compression pumps but does not use them. We have apparently not yet ordered him compression stockings 05/17/2021 upon evaluation today patient's legs though better than last time I personally saw him appear to be getting worse compared to where they were previous. Dr. Quentin Cornwall was actually last 1 to see you I have not seen him since 19 December. That was before he went into the ER. Coming out apparently his legs looked also and they still look better but not as good as they were in the past. 1/16; patient with severe bilateral lymphedema. Severe scaled hyperkeratotic skin on the dorsal aspect of his distal left foot and left medial ankle.. On the right side changes are not as bad. He did not have any weeping edema. Our intake nurse was convinced that he is being compliant with compression pumps 1 hour twice a day 05/31/2021 upon evaluation today patient actually appears to be doing a little bit better in my opinion in regard to his feet. I do not see as much drainage and it being just completely wet as it was previous. Fortunately I do not also see any signs of active infection which is great news as well. 06/07/21 Upon inspection patient's wound bed actually showed signs of doing well he is not nearly as weepy and wet as he has been in the past and overall very pleased in that regard. Fortunately I do not see any signs of active infection locally or nor systemically at this time. Which is great news. No fevers, chills, nausea, vomiting, or diarrhea. 06/14/2021 upon evaluation today patient appears to be doing well with regard to his right foot I am pleased in that regard. His left foot is still draining quite a bit despite using lymphedema pumps, 4-layer compression wraps, and he tells me elevating his legs as well. He also has Lasix that he takes twice a day.  Nonetheless I believe that this is still good to be an ongoing issue. We have a hard time getting this under control as far as the swelling is concerned. 06/21/2021 upon evaluation today patient appears to be doing decently well in regard to his wounds all things considered. He still has a tremendous amount of drainage and fluid noted at this point. Fortunately I do not see any signs of active infection locally or systemically at this point which is great news. Nonetheless I am unsure where to go and how to do this as far as trying to limit his swelling and weeping from his toes in particular. 06/28/2021 upon evaluation patient unfortunately continues to have significant drainage from his feet. We have been keeping him in a compression wrap and despite this he still continues to have extreme fluid issues he seen his cardiologist he is seeing the nephrologist. We really cannot find any  way to get this under control when he did well was when he was in the hospital and they got some of the fluid away. But outside of that we are just struggling to achieve the long-term goal of getting this under control and keeping it under control unfortunately. 07/05/2021 upon evaluation today patient appears to be doing poorly in regard to his feet. Unfortunately this continues to be a significant issue and to be honest I am really not certain what to do about it. I referred him to Dr. Randol Kern at [facility] and he does have an appointment although it is 12 April. He also sees his primary care provider on 6 April. He did not want to see Dr. Haynes Kerns until after he saw his PCP that is the reason the appointment so far out. There is really not much I can do in that regard. Nonetheless I do think that we are still continue to have significant lymphedema issues with significant mount of weeping in regard to the feet and again this has just become extremely difficult to manage to be honest I am not sure if there is something else that  Dr. Haynes Kerns or someone else could recommend he also will be seeing Dr. Dellia Nims in 2 weeks when I am on vacation and at that time I will see if Dr. Dellia Nims has any ideas about where to go from here in the meantime. KACI, FREEL ([id number]) 07/12/2021 upon evaluation today patient appears to be doing about as well as can be expected with regard to his feet. He does actually see his kidney doctor this Friday. He also will be seeing his primary care provider on April 4 and then following that around mid April he will be seeing Dr. Haynes Kerns at [facility] which was a referral made for him. Again my goal is to try to find out some way to fix this and to be perfectly honest we have had some issues with making any good adjustments. When he was in the hospital and greater amounts of Lasix he was able to get this down and it looked much better upon discharge. With that being said right now things just are not doing nearly as good as what they used to be. 3/13; patient presents for follow-up. He has been using his lymphedema pumps over the past week. He reports an increase in his Lasix dose. He has no issues or complaints today. He denies signs of infection. 07/26/2021 upon evaluation today patient appears to be doing better in regard to his feet bilaterally. Both are showing signs of much less drainage which is great news and overall very pleased in that regard. Fortunately there does not appear to be any evidence of active infection locally or systemically at this time. No fevers, chills, nausea, vomiting, or diarrhea. 08/02/2021 upon evaluation today patient appears to be doing well with regard to his feet. Both are showing signs of being drier the right pretty much has not really draining much at all which is great news. The left is not draining anywhere close to his much as it was during the last evaluation. This is excellent news and overall very pleased. 08/09/2021 upon evaluation today patient appears to be doing well  with regard to his legs the right leg especially showing signs of excellent improvement which is great news I do not see any evidence of active infection locally or systemically which is great. In regard to the left leg he still has some weeping and drainage but nothing as  significant as what it was in the past this is great news. 08-16-2021 upon evaluation today patient appears to actually be doing quite well in my opinion in regard to his feet. This is significantly improved compared to what we previously seen and overall I am extremely pleased in that regard. I do believe that He is actually improving although this is obviously very slow going. 08-23-2021 upon evaluation today patient appears to be doing well currently in regard to his right leg which actually is pretty dry at this point today. Fortunately I do not see any evidence of active infection at this time which is great news. No fevers, chills, nausea, vomiting, or diarrhea. 08-30-2021 upon evaluation today patient appears to be doing well with regard to his lower extremities. The right foot is pretty much completely dry which is great news the left foot though not completely dry seems to be doing decently well. I do not see any signs of active infection locally or systemically which is great news. No fevers, chills, nausea, vomiting, or diarrhea. 09-06-2021 upon evaluation today patient appears to be doing well with regard to his feet in fact now the left foot is almost completely dry as well and I am definitely seeing a lot of significant improvement. With that being said unfortunately he actually appears to have some cellulitis of his right thigh. His toes are also little bit red but this may just be due to the increased swelling. He really is not warm to touch in regard to the toes. 5/8; excellent edema control on the right foot and lower leg there is no open wounds but we continue to put compression on this otherwise this will breakdown.  He is using his compression pumps twice a day at home The area that is problematic is on the left dorsal foot some areas that are not fully epithelialized with very dry fissured skin over this area. 09-20-2021 upon evaluation today patient appears to be doing a little bit worse in regard to swelling at this time. Fortunately I do not see any signs of infection with that being said the wrap was not on quite as well as what I would like to have seen. I do believe that this has caused a little bit of excess swelling and again we need to try to get this under good control. 09-27-2021 upon evaluation today patient appears to be doing awesome in regard to his feet and legs. Everything is measuring smaller the swelling is down and to be perfectly honest I am extremely pleased with the end of his feet especially on the left side and how dry this is today. I do think we are on the right track here. 5/31; patient presents for follow-up. He is using nystatin powder to the feet bilaterally under 4-layer compression. He has no issues or complaints today. He states he is going to the lymphedema clinic tomorrow. Since he has no open wounds on the right lower extremity they will be focused on the side. 10-11-2021 upon evaluation today patient appears to be doing excellent in regard to his legs. He has been tolerating the dressing changes without complication. Fortunately there does not appear to be any evidence of active infection locally or systemically at this time which is great news. No fevers, chills, nausea, vomiting, or diarrhea. 10-18-2021 upon evaluation today patient appears to be doing well with regard to his legs. He has been tolerating the dressing changes without complication and actually seen in lymphedema clinic now in  regard to his right leg were taken care of the left leg. Fortunately I do not see any evidence of active infection locally or systemically at this time. 10-26-2021 upon evaluation today  patient's wounds actually are showing signs of doing well in fact he really does not have wounds as much is weeping of the lower extremities. Fortunately I do not see any evidence of active infection locally or systemically which is great news. No fever or chills noted 11-02-2021 upon evaluation today patient appears to be doing decently well in regard to his legs. Of actually been on the phone quite a bit discussing with [facility] custom compression for him. I also have been discussing everything with his lymphedema clinic provider as well. Helene Kelp notes that there is really not much more that she can do for him which was noted last week as well. Subsequently I do believe that the patient would benefit from getting custom compression in fact I think that is what he is going require to keep anything under control here. He voiced understanding. 11-08-2021 upon evaluation today patient appears to be doing excellent in regard to his legs and feet. Fortunately I do not see any signs of infection at this time which is great news. No fever or chills noted again I am still been working on trying to figure out what exactly we need to do as far as getting the [place] involved in coverage for custom compression for this patient. I also discussed with him that he does need to have custom shoes which I completely understand. With that being said it does make it difficult thinking about what kind of combination of shoes and compression is going to be best for him for the long-term. Fortunately I do not see any evidence of active infection locally or systemically which is great news. Overall I think you are doing quite well. 11-16-2021 upon evaluation today patient appears to be doing well currently in regard to his legs and feet. Everything is significantly smaller [place], [name] ([id number]) compared to what has been. Fortunately I do not see any evidence of active infection locally or systemically at this time  which is great news. 11-23-2021 upon evaluation today patient appears to be doing well with regard to his legs bilaterally. We can actually initiate treatment with a recommendation today to utilize Tubigrip and try to see if we can get this under better control. The patient is in agreement with the plan. Nonetheless the big question is whether this will be something that he can utilize at home in order to keep his swelling down and allow him to be able to function without having to come into the clinic as frequently. He notes that he would love to get to this point. He does have a cousin who is willing to help him with change out the Tubigrip when necessary. 7/25; the patient does not have any open wounds however his left leg is a lot more swollen than it was last week. His cousin came in who presumably would be the one to change his juxta lites to practice on doing this for the patient. She apparently [place]. In the meantime he has been using Tubigrip Objective Constitutional Sitting or standing Blood Pressure is within target range for patient.. Pulse regular and within target range for patient.Marland Kitchen Respirations regular, non- labored and within target range.. Temperature is normal and within the target range for the patient.Marland Kitchen appears in no distress. Vitals Time Taken: 11:08 AM,  Weight: 312 lbs, Temperature: 97.7 F, Pulse: 65 bpm, Respiratory Rate: 18 breaths/min, Blood Pressure: 144/72 mmHg. General Notes: Wound exam; the patient has no open wounds however he has severe bilateral lymphedema left greater than right skin changes especially on the left dorsal foot and left medial ankle. 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 We rewrapped him in Tubigrip bilaterally. 2. His cousin attempted to put the juxta lite on the left leg with only marginal success however we will try again. 3. He apparently has already been to the lymphedema clinic and they did not think there was anything else they could do here. 4. For now he has no open wound Electronic Signature(s) Signed: 11/30/2021 4:13:45 PM By: Linton Ham MD Entered By: Linton Ham on 11/30/2021 11:57:48 [name] ([id number]) -------------------------------------------------------------------------------- [place] Details Patient Name: [name] Date of Service: 11/30/2021 Medical Record Number: [id number] Patient Account Number: [id number] Date of Birth/Sex: 1951/03/31 (71 y.o. M) Treating RN: [name] Primary Care Provider: Fontaine No Other Clinician: Referring Provider: Fontaine No Treating Provider/Extender: Tito Dine in Treatment: 72 Diagnosis Coding ICD-10 Codes Code Description E11.622 Type 2 diabetes mellitus with other skin ulcer I89.0 Lymphedema, not elsewhere classified I87.2 Venous insufficiency (chronic) (peripheral) L97.822 Non-pressure chronic ulcer of other part of left lower leg with fat layer exposed L97.512 Non-pressure chronic ulcer of other part of right foot with fat layer exposed I10 Essential (primary) hypertension L97.811 Non-pressure chronic ulcer of other part of right lower leg limited to breakdown of skin Facility Procedures CPT4 Code: [id number] Description: 00938 - WOUND CARE VISIT-LEV 2 EST PT Modifier: Quantity: 1 Physician Procedures CPT4 Code: [id number] Description: 99213 - WC PHYS LEVEL 3 - EST PT Modifier: Quantity: 1 CPT4 Code: Description: ICD-10 Diagnosis Description I89.0 Lymphedema, not elsewhere  classified L97.822 Non-pressure chronic ulcer of other part of left lower leg with fat layer L97.512 Non-pressure chronic ulcer of other part of right foot with fat layer exp Modifier: exposed osed Quantity: Electronic Signature(s) Signed: 11/30/2021 3:59:47 PM By: [name] RN Signed: 11/30/2021 4:13:45 PM By: Linton Ham MD Entered By: [name] on 11/30/2021 15:59:47

## 2021-12-01 ENCOUNTER — Encounter: Payer: PPO | Admitting: Occupational Therapy

## 2021-12-07 ENCOUNTER — Encounter: Payer: No Typology Code available for payment source | Attending: Physician Assistant | Admitting: Physician Assistant

## 2021-12-07 DIAGNOSIS — L97822 Non-pressure chronic ulcer of other part of left lower leg with fat layer exposed: Secondary | ICD-10-CM | POA: Insufficient documentation

## 2021-12-07 DIAGNOSIS — I872 Venous insufficiency (chronic) (peripheral): Secondary | ICD-10-CM | POA: Diagnosis not present

## 2021-12-07 DIAGNOSIS — I110 Hypertensive heart disease with heart failure: Secondary | ICD-10-CM | POA: Insufficient documentation

## 2021-12-07 DIAGNOSIS — Z79899 Other long term (current) drug therapy: Secondary | ICD-10-CM | POA: Diagnosis not present

## 2021-12-07 DIAGNOSIS — I5040 Unspecified combined systolic (congestive) and diastolic (congestive) heart failure: Secondary | ICD-10-CM | POA: Insufficient documentation

## 2021-12-07 DIAGNOSIS — L97512 Non-pressure chronic ulcer of other part of right foot with fat layer exposed: Secondary | ICD-10-CM | POA: Insufficient documentation

## 2021-12-07 DIAGNOSIS — E11621 Type 2 diabetes mellitus with foot ulcer: Secondary | ICD-10-CM | POA: Insufficient documentation

## 2021-12-07 DIAGNOSIS — I2510 Atherosclerotic heart disease of native coronary artery without angina pectoris: Secondary | ICD-10-CM | POA: Insufficient documentation

## 2021-12-07 DIAGNOSIS — L97811 Non-pressure chronic ulcer of other part of right lower leg limited to breakdown of skin: Secondary | ICD-10-CM | POA: Diagnosis not present

## 2021-12-07 DIAGNOSIS — I890 Lymphedema, not elsewhere classified: Secondary | ICD-10-CM | POA: Diagnosis not present

## 2021-12-07 DIAGNOSIS — E669 Obesity, unspecified: Secondary | ICD-10-CM | POA: Diagnosis not present

## 2021-12-07 DIAGNOSIS — E1142 Type 2 diabetes mellitus with diabetic polyneuropathy: Secondary | ICD-10-CM | POA: Insufficient documentation

## 2021-12-07 DIAGNOSIS — E11622 Type 2 diabetes mellitus with other skin ulcer: Secondary | ICD-10-CM | POA: Diagnosis not present

## 2021-12-07 NOTE — Progress Notes (Addendum)
[name], [name] ([id number]) Visit Report for 12/07/2021 Arrival Information Details Patient Name: [name], [name] Date of Service: 12/07/2021 11:00 AM Medical Record Number: [id number] Patient Account Number: [id number] Date of Birth/Sex: [birth year] (71 y.o. M) Treating RN: [name] Primary Care Jaylon Boylen: Fontaine No Other Clinician: Referring Britini Garcilazo: Fontaine No Treating Rabiah Goeser/Extender: Skipper Cliche in Treatment: 16 Visit Information History Since Last Visit All ordered tests and consults were completed: No Patient Arrived: [name] Added or deleted any medications: No Arrival Time: 10:50 Any new allergies or adverse reactions: No Accompanied By: cousin Had a fall or experienced change in No Transfer Assistance: None activities of daily living that may affect Patient Identification Verified: Yes risk of falls: Secondary Verification Process Completed: Yes Signs or symptoms of abuse/neglect since last visito No Patient Requires Transmission-Based No Hospitalized since last visit: No Precautions: Implantable device outside of the clinic excluding No Patient Has Alerts: Yes cellular tissue based products placed in the center Patient Alerts: AVVS consult on file since last visit: Last ABI-R 1.09; L Has Dressing in Place as Prescribed: Yes 1.04 Has Compression in Place as Prescribed: Yes Pain Present Now: No Electronic Signature(s) Signed: 12/07/2021 11:28:30 AM By: [name] RN Entered By: [name] on 12/07/2021 11:28:30 [name] ([id number]) -------------------------------------------------------------------------------- Clinic Level of Care Assessment Details Patient Name: [name] Date of Service: 12/07/2021 11:00 AM Medical Record Number: [id number] Patient Account Number: [id number] Date of Birth/Sex: May 18, 1950 (71 y.o. M) Treating RN: [name] Primary Care Erubiel Manasco: Fontaine No Other Clinician: Referring Minard Millirons:  Fontaine No Treating August Longest/Extender: Skipper Cliche in Treatment: 32 Clinic Level of Care Assessment Items TOOL 4 Quantity Score X - Use when only an EandM is performed on FOLLOW-UP visit 1 0 ASSESSMENTS - Nursing Assessment / Reassessment X - Reassessment of Co-morbidities (includes updates in patient status) 1 10 X- 1 5 Reassessment of Adherence to Treatment Plan ASSESSMENTS - Wound and Skin Assessment / Reassessment _0  - Simple Wound Assessment / Reassessment - one wound 0 _1  - 0 Complex Wound Assessment / Reassessment - multiple wounds _2  - 0 Dermatologic / Skin Assessment (not related to wound area) ASSESSMENTS - Focused Assessment _3  - Circumferential Edema Measurements - multi extremities 0 _4  - 0 Nutritional Assessment / Counseling / Intervention _5  - 0 Lower Extremity Assessment (monofilament, tuning fork, pulses) _6  - 0 Peripheral Arterial Disease Assessment (using hand held doppler) ASSESSMENTS - Ostomy and/or Continence Assessment and Care _7  - Incontinence Assessment and Management 0 _8  - 0 Ostomy Care Assessment and Management (repouching, etc.) PROCESS - Coordination of Care X - Simple Patient / Family Education for ongoing care 1 15 _9  - 0 Complex (extensive) Patient / Family Education for ongoing care _10  - 0 Staff obtains Programmer, systems, Records, Test Results / Process Orders _11  - 0 Staff telephones HHA, Nursing Homes / Clarify orders / etc _12  - 0 Routine Transfer to another Facility (non-emergent condition) _13  - 0 Routine Hospital Admission (non-emergent condition) _14  - 0 New Admissions / Biomedical engineer / Ordering NPWT, Apligraf, etc. _15  - 0 Emergency Hospital Admission (emergent condition) X- 1 10 Simple Discharge Coordination _16  - 0 Complex (extensive) Discharge Coordination PROCESS - Special Needs _17  - Pediatric / Minor Patient Management 0 _18  - 0 Isolation Patient Management _19  - 0 Hearing / Language / Visual special  needs _20  - 0 Assessment of Community assistance (transportation, D/C planning, etc.) _21  - 0 Additional assistance / Altered mentation _22  - 0 Support Surface(s) Assessment (bed, cushion, seat, etc.) INTERVENTIONS - Wound Cleansing /  Measurement [place], [name] ([id number]) _0  - 0 Simple Wound Cleansing - one wound _1  - 0 Complex Wound Cleansing - multiple wounds _2  - 0 Wound Imaging (photographs - any number of wounds) _3  - 0 Wound Tracing (instead of photographs) _4  - 0 Simple Wound Measurement - one wound _5  - 0 Complex Wound Measurement - multiple wounds INTERVENTIONS - Wound Dressings _6  - Small Wound Dressing one or multiple wounds 0 _7  - 0 Medium Wound Dressing one or multiple wounds _8  - 0 Large Wound Dressing one or multiple wounds X- 1 5 Application of Medications - topical <BJSEGBTDVVOHYWVP>_7<\/TGGYIRSWNIOEVOJJ>_0  - 0 Application of Medications - injection INTERVENTIONS - Miscellaneous _10  - External ear exam 0 _11  - 0 Specimen Collection (cultures, biopsies, blood, body fluids, etc.) _12  - 0 Specimen(s) / Culture(s) sent or taken to Lab for analysis _13  - 0 Patient Transfer (multiple staff / Civil Service fast streamer / Similar devices) _14  - 0 Simple Staple / Suture removal (25 or less) _15  - 0 Complex Staple / Suture removal (26 or more) _16  - 0 Hypo / Hyperglycemic Management (close monitor of Blood Glucose) _17  - 0 Ankle / Brachial Index (ABI) - do not check if billed separately X- 1 5 Vital Signs Has the patient been seen at the hospital within the last three years: Yes Total Score: 50 Level Of Care: New/Established - Level 2 Electronic Signature(s) Signed: 12/07/2021 4:03:12 PM By: [name] RN Entered By: [name] on 12/07/2021 13:07:02 [name] ([id number]) -------------------------------------------------------------------------------- Encounter Discharge Information Details Patient Name: [name] Date of Service: 12/07/2021 11:00 AM Medical Record Number: [id number] Patient Account Number:  [id number] Date of Birth/Sex: [birth year] (71 y.o. M) Treating RN: [name] Primary Care Icyss Skog: Fontaine No Other Clinician: Referring Tayen Narang: Fontaine No Treating Jayln Madeira/Extender: Skipper Cliche in Treatment: 59 Encounter Discharge Information Items Discharge Condition: Stable Ambulatory Status: Wheelchair Discharge Destination: Home Transportation: Private Auto Accompanied By: self Schedule Follow-up Appointment: Yes Clinical Summary of Care: Electronic Signature(s) Signed: 12/07/2021 1:07:58 PM By: [name] RN Entered By: [name] on 12/07/2021 13:07:58 [name] ([id number]) -------------------------------------------------------------------------------- Lower Extremity Assessment Details Patient Name: [name] Date of Service: 12/07/2021 11:00 AM Medical Record Number: [id number] Patient Account Number: [id number] Date of Birth/Sex: 06-08-1950 (71 y.o. M) Treating RN: [name] Primary Care Doniel Maiello: Fontaine No Other Clinician: Referring Analuisa Tudor: Fontaine No Treating Kursten Kruk/Extender: Skipper Cliche in Treatment: 45 Edema Assessment Assessed: [Left: No] Patrice Paradise: No] [Left: Edema] [Right: :] Calf Left: Right: Point of Measurement: 38 cm From Medial Instep 38 cm 38 cm Ankle Left: Right: Point of Measurement: 14 cm From Medial Instep 30 cm 24 cm Vascular Assessment Pulses: Dorsalis Pedis Palpable: [Left:Yes] [Right:Yes] Electronic Signature(s) Signed: 12/07/2021 11:29:36 AM By: [name] RN Entered By: [name] on 12/07/2021 11:29:36 [name] ([id number]) -------------------------------------------------------------------------------- Multi Wound Chart Details Patient Name: [name] Date of Service: 12/07/2021 11:00 AM Medical Record Number: [id number] Patient Account Number: [id number] Date of Birth/Sex: August 02, 1950 (71 y.o. M) Treating RN: [name] Primary Care Jonnell Hentges: Fontaine No Other Clinician: Referring Marvina Danner: Fontaine No Treating Ayad Nieman/Extender: Skipper Cliche in Treatment: 45 Vital Signs Height(in): Pulse(bpm): 88 Weight(lbs): 312 Blood Pressure(mmHg): 142/88 Body Mass Index(BMI): Temperature(F): 97.7 Respiratory Rate(breaths/min): 20 Wound Assessments Treatment Notes Electronic Signature(s) Signed: 12/07/2021 11:29:53 AM By: [name] RN Entered By: [name] on 12/07/2021 11:29:53 [name] ([id number]) -------------------------------------------------------------------------------- [place] Details Patient Name: [name] Date of Service: 12/07/2021 11:00 AM Medical Record Number: [id number] Patient Account Number: [id number] Date of Birth/Sex: August 16, 1950 (71  y.o. M) Treating RN: [name] Primary Care Serin Thornell: Fontaine No Other Clinician: Referring Kathey Simer: Fontaine No Treating Danamarie Minami/Extender: Skipper Cliche in Treatment: 4 Active Inactive Soft Tissue Infection Nursing Diagnoses: Impaired tissue integrity Knowledge deficit related to disease process and management Knowledge deficit related to home infection control: handwashing, handling of soiled dressings, supply storage Potential for infection: soft tissue Goals: Patient/caregiver will verbalize understanding of or measures to prevent infection and contamination in the home setting Date Initiated: 09/06/2021 Date Inactivated: 10/18/2021 Target Resolution Date: 09/06/2021 Goal Status: Met Patient's soft tissue infection will resolve Date Initiated: 09/06/2021 Date Inactivated: 10/18/2021 Target Resolution Date: 09/06/2021 Goal Status: Met Signs and symptoms of infection will be recognized early to allow for prompt treatment Date Initiated: 09/06/2021 Target Resolution Date: 11/28/2021 Goal Status: Active Interventions: Assess signs and symptoms of infection every visit Provide education on  infection Treatment Activities: Education provided on Infection : 11/30/2021 Systemic antibiotics : 09/06/2021 Notes: Electronic Signature(s) Signed: 12/07/2021 11:29:45 AM By: [name] RN Entered By: [name] on 12/07/2021 11:29:45 [name] ([id number]) -------------------------------------------------------------------------------- Pain Assessment Details Patient Name: [name] Date of Service: 12/07/2021 11:00 AM Medical Record Number: [id number] Patient Account Number: [id number] Date of Birth/Sex: 06-24-1950 (71 y.o. M) Treating RN: [name] Primary Care Esiquio Boesen: Fontaine No Other Clinician: Referring Tal Kempker: Fontaine No Treating Carlia Bomkamp/Extender: Skipper Cliche in Treatment: 45 Active Problems Location of Pain Severity and Description of Pain Patient Has Paino No Site Locations Pain Management and Medication Current Pain Management: Electronic Signature(s) Signed: 12/07/2021 11:29:01 AM By: [name] RN Entered By: [name] on 12/07/2021 11:29:01 [name] ([id number]) -------------------------------------------------------------------------------- Patient/Caregiver Education Details Patient Name: [name] Date of Service: 12/07/2021 11:00 AM Medical Record Number: [id number] Patient Account Number: [id number] Date of Birth/Gender: 1951/04/11 (70 y.o. M) Treating RN: [name] Primary Care Physician: Fontaine No Other Clinician: Referring Physician: Fontaine No Treating Physician/Extender: Skipper Cliche in Treatment: 59 Education Assessment Education Provided To: Patient Education Topics Provided Infection: Methods: Explain/Verbal Responses: State content correctly Electronic Signature(s) Signed: 12/07/2021 4:03:12 PM By: [name] RN Entered By: [name] on 12/07/2021 13:07:20 [name]  ([id number]) -------------------------------------------------------------------------------- Vitals Details Patient Name: [name] Date of Service: 12/07/2021 11:00 AM Medical Record Number: [id number] Patient Account Number: [id number] Date of Birth/Sex: 01/01/1951 (71 y.o. M) Treating RN: [name] Primary Care Deeksha Cotrell: Fontaine No Other Clinician: Referring Zoei Amison: Fontaine No Treating Syanna Remmert/Extender: Skipper Cliche in Treatment: 45 Vital Signs Time Taken: 10:50 Temperature (F): 97.7 Weight (lbs): 312 Pulse (bpm): 88 Respiratory Rate (breaths/min): 20 Blood Pressure (mmHg): 142/88 Reference Range: 80 - 120 mg / dl Electronic Signature(s) Signed: 12/07/2021 11:28:55 AM By: [name] RN Entered By: [name] on 12/07/2021 11:28:54

## 2021-12-07 NOTE — Progress Notes (Addendum)
[name], Mooney ([id number]) Visit Report for 12/07/2021 Chief Complaint Document Details Patient Name: [name], [name] Date of Service: 12/07/2021 11:00 AM Medical Record Number: [id number] Patient Account Number: [id number] Date of Birth/Sex: [birth year] (71 y.o. M) Treating RN: [name] Primary Care Provider: Fontaine No Other Clinician: Referring Provider: Fontaine No Treating Provider/Extender: Skipper Cliche in Treatment: 71 Information Obtained from: Patient Chief Complaint Left LE ulcers Electronic Signature(s) Signed: 12/07/2021 12:05:24 PM By: Worthy Keeler PA-C Entered By: Worthy Keeler on 12/07/2021 12:05:24 [name] ([id number]) -------------------------------------------------------------------------------- HPI Details Patient Name: [name] Date of Service: 12/07/2021 11:00 AM Medical Record Number: [id number] Patient Account Number: [id number] Date of Birth/Sex: June 02, 1950 (71 y.o. M) Treating RN: [name] Primary Care Provider: Fontaine No Other Clinician: Referring Provider: Fontaine No Treating Provider/Extender: Skipper Cliche in Treatment: 64 History of Present Illness HPI Description: 71 year old male who presented to the ER with bilateral lower extremity blisters which had started last week. he has a past medical history of leukemia, diabetes mellitus, hypertension, edema of both lower extremities, his recurrent skin infections, peripheral vascular disease, coronary artery disease, congestive heart failure and peripheral neuropathy. in the ER he was given Rocephin and put on Silvadene cream. he was put on oral doxycycline and was asked to follow-up with the [facility]. His last hemoglobin A1c was 6.6 in December and he checks his blood sugar once a week. He does not have any physicians outside the [place] system. He does not recall any vascular duplex studies done either for arterial or venous disease but was told to  wear compression stockings which he does not use 05/30/2016 -- we have not yet received any of his notes from the [facility] hospital system and his arterial and venous duplex studies are scheduled here in [place] around mid February. We are unable to have his insurance accepted by home health agencies and hence he is getting dressings only once a week. 06/06/16 -- -- I received a call from the patient's PCP at the [facility] at [facility] and spoke to Dr. Garvin Fila, phone number [phone] and fax number [phone]. She confirmed that no vascular testing was done over the last 5 years and she would be happy to do them if the patient did want them to be done at the [place] and we could fax him a request. Readmission: 71 year old male seen by as in February of this year and was referred to vein and vascular for studies and opinion from the vascular surgeons. The patient returns today with a fresh problem having had blisters on his left lower extremity which have been there for about 5 days and he clearly states that he has been wearing his compression stockings as advised though he could not read the moderate compression and has been wearing light compression. Review of his electronic medical records note that he had lower extremity arterial duplex examination done on 06/23/2016 which showed no hemodynamically significant stenosis in the bilateral lower extremity arterial system. He also had a lower extremity venous reflux examination done on 07/07/2016 and it was noted that he had venous incompetence in the right great saphenous vein and bilateral common femoral veins. Patient was seen by Dr. Tamala Julian on the same day and for some reason his notes do not reflect the venous studies or the arterial studies and he recommended patient do a venous duplex ultrasound to look for reflux and return to see him.he would also consider a lymph pump if required. The patient was told that his workup  was normal and hence the  patient canceled his follow-up appointment. 02/03/17 on evaluation today patient left medial lower extremity blister appears to be doing about the same. It is still continuing to drain and there's still the blistered skin covering the wound bed which is making it difficult for the alternate to do its job. Fortunately there is no evidence of cellulitis. No fevers chills noted. Patient states in general he is not having any significant discomfort. Patient's lower extremity arterial duplex exam revealed that patient was hemodynamically stable with no evidence of stenosis in regard to the bilateral lower extremities. The lower extremity venous reflux exam revealed the patient had venous incontinence noted in the right greater saphenous and bilateral common femoral vein. There is no evidence of deep or superficial vein thrombosis in the bilateral lower extremities. Readmission: 11/12/18 Patient presents for evaluation our clinic today concerning issues that he is having with his left lower extremity. He tells me that a couple weeks ago he began developing blisters on the left lower extremity along with increased swelling. He typically wears his compression stockings on a regular basis is previously been evaluated both here as well is with vascular surgery they would recommend lymphedema pumps but unfortunately that somehow fell through and he never heard anything back from that. Nonetheless I think lymphedema pumps would be beneficial for this patient. He does have a history of hypertension and diabetes. Obviously the chronic venous stasis and lymphedema as well. At this point the blisters have been given in more trouble he states sometimes when the blisters openings able to clean it down with alcohol and it will dry out and do well. Unfortunately that has not been the case this time. He is having some discomfort although this mean these with cleaning the areas he doesn't have discomfort just on a regular  basis. He has not been able to wear his compression stockings since the blisters arose due to the fact that of course it will drain into the socks causing additional issues and he didn't have any way to wrap this otherwise. He has increased to taking his Lasix every day instead of every other day. He sees his primary care provider later this month as well. No fevers, chills, nausea, or vomiting noted at this time. 11/19/18-Patient returns at 1 week, per intake RN the amount of seepage into the compression wraps was definitely improved, overall all the wounds are measuring smaller but continuing silver alginate to the wounds as primary dressing 11/26/18 on evaluation today patient appears to be doing quite well in regard to his left lower [place] ulcers. In fact of the areas that were noted initially he only has two regions still open. There is no evidence of active infection at this time. He still is not heard anything from the company regarding lymphedema pumps as of yet. Again as previously seen vascular they have not recommended any surgical intervention. 12/03/2018 on evaluation today patient actually appears to be doing quite well with regard to his lower extremity ulcers. In fact most of the areas appear to be healed the one spot which does not seem to be completely healed I am unsure of whether or not this is really draining that much but nonetheless there does not appear to be any signs of infection or significant drainage at this point. There is no sign of fever, chills, nausea, vomiting, or diarrhea. Overall I am pleased with how things have progressed I think is very close to being able to transition  to his home compression stockings. HOUA, ACKERT ([id number]) 12/10/2018 upon evaluation today patient appears to be doing quite well with regard to his left lower extremity. He has been tolerating the dressing changes without complication. Fortunately there is no signs of active infection at this  time. He appears after thorough evaluation of his leg to only have 1 small area that remains open at this point everything else appears to be almost completely closed. He still have significant swelling of the left lower extremity. We had discussed discussing this with his primary care provider he is not able to see her in person they were at the [facility] and right now the [place] is not seeing patients on site. According to the patient anyway. Subsequently he did speak with her apparently and his primary care provider feels that he may likely have a DVT. With that being said she has not seen his leg she is just going off of his history. Nonetheless that is a concern that the patient now has as well and while I do not feel the DVT is likely we can definitely ensure that that is not the case I will go ahead and see about putting that order in today. Nonetheless otherwise I am in a recommend that we continue with the current wound care measures including the compression therapy most likely. We just need to ensure that his leg is indeed free of any DVTs. 12/17/2018 on evaluation today patient actually appears to be completely healed today. He does have 2 very small areas of blistering although this is not anything too significant at this point which is good news. With that being said I am in agreement with the fact that I think he is completely healed at this point. He does want to get back into his compression stocking. The good news is we have gotten approval from insurance for his lymphedema pumps we received a letter since last saw him last week. The other good news is his study did come back and showed no evidence of a DVT. 12/20/2018 on evaluation today patient presents for follow-up concerning his ongoing issues with his left lower extremity. He was actually discharged last Friday and did fairly well until he states blisters opened this morning. He tells me he has been wearing his compression  stocking although he has a hard time getting this on. There does not appear to be any signs of active infection at this time. No fevers, chills, nausea, vomiting, or diarrhea. 12/27/2018 on evaluation today patient appears to be doing very well with regard to his swelling of the left lower extremity the 4 layer compression wrap seems to have been beneficial for him. Fortunately there is no signs of active infection at this time. Patient has been tolerating the compression wrap without complication and his foot swelling in particular appears to be greatly improved. He does still have a wound on the lateral portion of his left leg I believe this is more of a blister that has now reopened. 01/03/2019 on evaluation today patient actually appears to be doing excellent in regard to his left lower extremity. He did receive his compression pumps and is actually use this 7 times since he was last here in the office. On top of the compression wrap he is now roughly 3 cm better at the calf and 2 cm better at the ankle he also states that his foot seem to go an issue better without even having to use a shoe horn. Obviously I  think this is all evidence that he is doing excellent in this regard. The other good news is he does not appear to have anything open today as far as wounds are concerned. 01/15/2019 on evaluation today patient appears to be doing more poorly yet again with regard to his left lower extremity. He has developed new wounds again after being discharged just recently. Unfortunately this continues to be the case that he will heal and then have subsequent new wounds. The last time I was hopeful that he may not end up coming back too quickly especially since he states he has been using his lymphedema pumps along with wearing his compression. Nonetheless he had a blister on the back of his leg that popped up on the left and this has opened up into an ulceration it is quite painful. 01/22/19 on evaluation  today patient actually appears to be doing well with regard to his wound on the left lower extremity. He's been tolerating the dressing changes without complication including the compression wrap in the wound appears to be significantly smaller today which is great news. Overall very pleased in this regard. 01/29/2019 on evaluation today patient appears to be doing well with regard to his left posterior lower extremity ulcer. He has been tolerating the dressing changes without complication. This is not completely healed but is getting much closer. We did order a Farrow wrap 4000 for him he has received this and has it with him today although I am not sure we are quite ready to start him on that as of yet. We are very close. 02/05/2019 on evaluation today patient actually appears to be doing quite well with regard to his left posterior lower extremity ulcer. He still has a very tiny opening remaining but the fortunate thing is he seems to be healing quite nicely. He also did get his Farrow wrap which I am hoping will help with his edema control as well at home. Fortunately there is no evidence of active infection. 02/12/2019 patient and fortunately appears to be doing poorly in regard to his wounds of the left lower extremity. He was very close to healing therefore we attempted to use his Velcro compression wraps continuing with lymphedema pumps at home. Unfortunately that does not seem to have done very well for him. He tells me that he wore them all the time but again I am not sure why if that is the case that he is having such significant edema. He is still on his fluid pills as well. With that being said there is no obvious sign of infection although I do wonder about the possibility of infection at this time as well. 02/19/2019 unfortunately upon evaluation today patient appears to be doing more poorly with regard to his left lower extremity. He is not showing signs of significant improvement and I  think the biggest issue here is that he does have an infection that appears to likely be Pseudomonas. That is based on the blue-green drainage that were noted at this time. Unfortunately the antibiotic that has been on is not going to take care of this at all. I think they will get a need to switch him to either Levaquin or Cipro and this was discussed with the patient. 02/26/2019 on evaluation today patient's lower extremity on the left appears to be doing significantly better as compared to last evaluation. Fortunately there is no signs of active infection at this time. He has been tolerating the compression wrap without complication in  fact he made it the whole week at this point. He is showing signs of excellent improvement I am very happy in this regard. With that being said he is having some issues with infection we did review the results of his culture which I noted today. He did have a positive finding for Enterobacter as well as Alcaligenes faecalis. Fortunately the Levaquin that I placed him on will work for both which is great news. There is no signs of systemic infection at this point. 10/30; left posterior leg wound in the setting of very significant edema and what looks like chronic venous inflammation. He has compression pumps but does not use them. We have been using 3 layer compression. Silver alginate to the wound as the primary dressing 03/18/2019 on evaluation today patient appears to be doing a little better compared to last time I saw him. He really has not been using his compression pumps he tells me that he is having too much discomfort. He has been keeping his wraps on however. He is only been taking his fluid pills every other day because he states they are not really helping and he has an appointment with his primary care provider at the [facility] tomorrow. Subsequently the wound itself on the left lower extremity does seem to be greatly improved compared to previous. 03/25/2019 on  evaluation today patient appears to be doing better with regard to his wounds on the bilateral lower extremities. The left is doing excellent the right is also doing better although both still do show some signs of open wounds noted at this point unfortunately. Fortunately there is no signs of active infection at this time. The patient also is not really having any significant pain which is good news. Unfortunately there was some confusion with the referral on vascular disease and as far as getting the patient scheduled there can be contacting him later today to do this KYRUS, HYDE ([id number]) fortunately we got this straightened out. 04/01/2019 on evaluation today patient appears to be doing no fevers, chills, nausea, vomiting, or diarrhea. Excellent at this time with regard to his lower extremities. There does not appear to be any open wound at this point which is good news. Fortunately is also no signs of active infection at this time. Overall feel like the patient has done excellent with the compression the problem is every time we got him to this point and then subsequently go to using his own compression things just go right back to where they were. I am not sure how to address this we can try to get an appointment with vascular for 2 weeks now they have yet to call him. Obviously this has become frustrating for the patient as well. I think the issue has just been an honest error as far as scheduling is concerned but nonetheless still worn out the point where I am unsure of which direction we should take. 04/08/2019 on evaluation today patient actually appears to be doing well with regard to his lower extremities. There are no open wounds at this time and things seem to be managing quite nicely as far as the overall edema control is concerned. With that being said he does have his compression socks today for [place] to go ahead and reinitiate therapy in that manner at this point. He is going to be  going for shoes to be measured on Wednesday and then coincidentally he will also be seeing vascular on Thursday. Overall I think this is good news and  again I am hopeful that they will be able to do something for him to help prevent ongoing issues with edema control as well. No fevers, chills, nausea, vomiting, or diarrhea. 04/11/2019 on evaluation today patient actually appears to be doing poorly after just being discharged on Monday of this week. He had been experiencing issues with again blisters especially on the left lower extremity. With that being said he was completely healed and appeared to be doing great this past Monday. He then subsequently has new blisters that formed before his appointment with vascular this morning. He was also measured for shoes in the interim. With that being said we may have figured out what exactly is going on and why he continues to have issues like what we are seeing at this point. He takes his compression stockings off at nighttime and then he ends up having to sleep in his chair for 5-6 hours a night. He sleeps with his feet down he cannot really get him up in the recliner and therefore he is sleeping and the worst possible his position with his feet on the floor for that majority of the time. Again as I explained to him that is about one third at minimum at least one fourth of his day that he spending with his feet dangling down on the ground and the worst possible position they could be. I think this may be what is causing the issue. Subsequently I am leaning toward thinking that he may need a hospital bed in order to elevate his legs. We likely can have to coordinate this with his primary care provider at the [facility]. Readmission: 01/26/2021 this is a patient who presents for repeat evaluation here in the clinic although it is actually been couple of years since have seen him in fact it was December 2020 when I last saw him. Subsequently he never really  healed but did end up being lost to follow-up. He tells me has been having issues ongoing with his lower extremities has bilateral lower extremity lymphedema no real significant or definitive open wounds but in general his lymphedema is way out of control. We were never able to refer him to lymphedema clinic simply due to the fact to be honest we were never able to get him completely healed. I do not see anyone with open wounds. The patient does have evidence of type 2 diabetes mellitus, lymphedema, chronic venous insufficiency, and hypertension. That really has not changed since his last evaluation. 02/09/2021 upon evaluation today patient appears to be doing a little better in regard to his legs although he still having a tremendous amount of drainage especially on the left leg. Fortunately there does not appear to be any evidence of active infection. Of note when we looked into this further it appears that the patient did not have any absorptive dressing on it was just the 4-layer compression wrap. Nonetheless this is probably big part of the issue here. 10/10; he comes in today with 3 large areas on the upper right lower leg likely remanence of denuded blistering under his compression wraps. He has no other wounds on the right. On the left he has the denuded area on the left medial foot and ankle and on the left dorsal foot. Massive lymphedema in both feet dorsally. Using Zetuvit under compression We have increased home health visitation to twice a week to change the dressings and will change it once 02/22/2021 upon evaluation today patient appears to be doing well currently  with regard to his wounds. He has been tolerating the dressing changes without complication. Fortunately there does not appear to be any evidence of active infection which is great news. No fevers, chills, nausea, vomiting, or diarrhea. The biggest issue I see currently is that home health is not putting any medicine on the  actual wounds before wrapping. 03/01/2021 upon evaluation today the patient's right leg actually appears to be doing quite well which is great news there does not appear to be any evidence of active infection at this time. No fevers, chills, nausea, vomiting, or diarrhea. With that being said the patient is having issues on the left foot where he is having significant drainage is also an ammonia smell he does not have any animals at home and this makes me concerned about a bacteria producing urea as a byproduct. Again the possible common organisms will be E. coli, Proteus, and Enterococcus. All 3 of which can be successfully treated with Levaquin. For that reason I think that this may be a good option for [place] to consider placing him on and I did obtain a culture as well for confirmation sake. 03/08/2021 upon evaluation today patient appears to be doing unfortunately still somewhat poorly in regard to his leg ulcerations. He actually has an area on the right leg where he blistered due to the fact that his wrap slid down and caused an area of pinching on his skin and this has led to a significant issue here. 03/15/2021 upon evaluation today patient unfortunately has not been wrapped appropriately with absorptive dressings nor with the appropriate technique for the third layer of the 4-layer compression wrap. These are issues that we continue to try to address with the home health nurse. Also the absorptive dressing that she had was cut in half and therefore that causes things to leak out it does not actually trap the fluid in regard to the top of the foot overall I think that all these combined are really not seeing things improved significantly here. Fortunately there does not appear to be any signs of significant infection at this time which is good news. He still is having a tremendous amount of drainage. 03/22/2021 upon evaluation today patient appears to be draining tremendously. He still continues  to tell me that he is using his pumps 2 times a day and that coupled with that tells me that he is elevating his legs as well. With that being said all things considered I am really just not seeing the improvement we would expect to see with the 4-layer compression wrap and all the above noted. He in fact had an extremely large Zetuvit dressing on both legs and that they were extremely filled to the max with fluid. This is after just being changed just before the weekend and this is Monday. Nonetheless I am concerned about the fact that there is something going on fairly significant that we cannot get any of this under control and that he is draining this significantly. He supposed be having an echocardiogram it sounds like scheduling has been an issue for him as far as getting in sooner. Its something to do with needing his cousin to drive him because of where it sat and he cannot drive himself to this appointment either way I really think he needs to try to see what he can do about making this happen a little sooner. He tells me he will call today. KAVONTAE, PRITCHARD ([id number]) 03/29/2021 on evaluation today patient appears to be  doing about the same in regard to his legs. He did get his cardiology appointment moved up to 6 December which is at least good that is better than what it was before mid December. Overall very pleased in that regard. 04/05/2021 upon evaluation today patient unfortunately is still doing fairly poorly. There does not appear to be any signs of active infection at this time. No fevers, chills, nausea, vomiting, or diarrhea. Unfortunately I think until his edema is under control and overall fluid overload there is really not to be much chance that I can do much to get him better. This is quite unfortunate and frustrating both for myself and the patient to be perfectly honest. Nonetheless I think that he really needs to have a conversation both with his primary care provider as well  as cardiologist he sees the PCP on Monday and cardiology on Wednesday of next week. 04/13/2021 upon evaluation today patient appears to be doing poorly in regard to his bilateral lower extremities his left is still worse than the right. With that being said he has a tremendous amount of drainage he did see his primary care provider yesterday there really was not much there to be done from their perspective. He sees cardiology tomorrow. Nonetheless my biggest concern here is simply that if we do not get the edema under control he is going to continue to have drainage and honestly I think at some point he is going to become infected severely that is my main concern. 04/19/2021 upon evaluation today patient appears to be doing poorly still in regard to his legs. Unfortunately there does not appear to be any signs of infection at this point. He does have a tremendous amount of drainage however. We have not seen the results back from the cardiologist and the echocardiogram that was done. It appears that the patient checked out okay as far as that is concerned with regard to ejection fraction though we still have some issues here to be honest with his diastolic function. I am unsure if this is accounting for everything that we are seeing or not. Either way he has a tremendous amount of drainage from his legs that we are just not able to control in the outpatient setting at this point. I have reached out to Dr. Rockey Situ his cardiologist to see once he reviews the sheet if there is anything that he feels like can be done from an outpatient perspective if not then I think the way to go is probably can to be through inpatient admission and diuresis. Otherwise I am not sure how working to get this under control we tried antibiotics, compression wrapping, and I have told the patient to be elevating his legs I am not sure how much he does of this but either way I think that this is still an ongoing issue  nonetheless. 04/26/2021 upon evaluation today patient appears to be doing poorly in regard to his legs. He is having a tremendous amount of fluid at this point which is quite unfortunate. Its to the point that he may have had at least 5 to 10 pounds of fluid in his dressings this morning when they were removed these were changed this Friday. Subsequently I think he needs to go to the ER for further evaluation and treatment I think is probably can need diuresis possibly even IV antibiotics been on what the blood work looks like but in general I feel like he needs something to get this under control from  an outpatient perspective absent of everywhere I can think of and I cannot get this under control with our traditional measures. I think this is going require more so that we can get him better 12/30; this is a patient with severe bilateral lymphedema. He was hospitalized from 04/26/2021 through 04/29/2021 treated for cellulitis in the setting of lower extremity ulcers and lymphedema. After he left the hospital he is apparently seen for nurse visit our staff contacted cardiology and he has been started on Lasix 40 mg. Apparently his legs have less edema. Lab work from 05/04/2021 showed a BUN of 38 and creatinine of 1.59 these are elevated versus previous where his creatinine seems to have been 1.30 on 12/19 his potassium is 4.3. I believe the lab work is being followed by cardiology We have him in a 4-layer wrap. Xeroform on the leg wounds and sit to fit on the Berry damage skin on the left dorsal foot versus right dorsal foot. He has compression pumps but does not use them. We have apparently not yet ordered him compression stockings 05/17/2021 upon evaluation today patient's legs though better than last time I personally saw him appear to be getting worse compared to where they were previous. Dr. Quentin Cornwall was actually last 1 to see you I have not seen him since 19 December. That was before he went into  the ER. Coming out apparently his legs looked also and they still look better but not as good as they were in the past. 1/16; patient with severe bilateral lymphedema. Severe scaled hyperkeratotic skin on the dorsal aspect of his distal left foot and left medial ankle.. On the right side changes are not as bad. He did not have any weeping edema. Our intake nurse was convinced that he is being compliant with compression pumps 1 hour twice a day 05/31/2021 upon evaluation today patient actually appears to be doing a little bit better in my opinion in regard to his feet. I do not see as much drainage and it being just completely wet as it was previous. Fortunately I do not also see any signs of active infection which is great news as well. 06/07/21 Upon inspection patient's wound bed actually showed signs of doing well he is not nearly as weepy and wet as he has been in the past and overall very pleased in that regard. Fortunately I do not see any signs of active infection locally or nor systemically at this time. Which is great news. No fevers, chills, nausea, vomiting, or diarrhea. 06/14/2021 upon evaluation today patient appears to be doing well with regard to his right foot I am pleased in that regard. His left foot is still draining quite a bit despite using lymphedema pumps, 4-layer compression wraps, and he tells me elevating his legs as well. He also has Lasix that he takes twice a day. Nonetheless I believe that this is still good to be an ongoing issue. We have a hard time getting this under control as far as the swelling is concerned. 06/21/2021 upon evaluation today patient appears to be doing decently well in regard to his wounds all things considered. He still has a tremendous amount of drainage and fluid noted at this point. Fortunately I do not see any signs of active infection locally or systemically at this point which is great news. Nonetheless I am unsure where to go and how to do this  as far as trying to limit his swelling and weeping from his toes in particular.  06/28/2021 upon evaluation patient unfortunately continues to have significant drainage from his feet. We have been keeping him in a compression wrap and despite this he still continues to have extreme fluid issues he seen his cardiologist he is seeing the nephrologist. We really cannot find any way to get this under control when he did well was when he was in the hospital and they got some of the fluid away. But outside of that we are just struggling to achieve the long-term goal of getting this under control and keeping it under control unfortunately. 07/05/2021 upon evaluation today patient appears to be doing poorly in regard to his feet. Unfortunately this continues to be a significant issue and to be honest I am really not certain what to do about it. I referred him to Dr. Randol Kern at [facility] and he does have an appointment although it is 12 April. He also sees his primary care provider on 6 April. He did not want to see Dr. Haynes Kerns until after he saw his PCP that is the reason the appointment so far out. There is really not much I can do in that regard. Nonetheless I do think that we are still continue to have significant lymphedema issues with significant mount of weeping in regard to the feet and again this has just become extremely difficult to manage to be honest I am not sure if there is something else that Dr. Haynes Kerns or someone else could recommend he also will be seeing Dr. Dellia Nims in 2 weeks when I am on vacation and at that time I will see if Dr. Dellia Nims has any ideas about where to go from here in the meantime. JACOBY, ZANNI ([id number]) 07/12/2021 upon evaluation today patient appears to be doing about as well as can be expected with regard to his feet. He does actually see his kidney doctor this Friday. He also will be seeing his primary care provider on April 4 and then following that around mid April he will be  seeing Dr. Haynes Kerns at [facility] which was a referral made for him. Again my goal is to try to find out some way to fix this and to be perfectly honest we have had some issues with making any good adjustments. When he was in the hospital and greater amounts of Lasix he was able to get this down and it looked much better upon discharge. With that being said right now things just are not doing nearly as good as what they used to be. 3/13; patient presents for follow-up. He has been using his lymphedema pumps over the past week. He reports an increase in his Lasix dose. He has no issues or complaints today. He denies signs of infection. 07/26/2021 upon evaluation today patient appears to be doing better in regard to his feet bilaterally. Both are showing signs of much less drainage which is great news and overall very pleased in that regard. Fortunately there does not appear to be any evidence of active infection locally or systemically at this time. No fevers, chills, nausea, vomiting, or diarrhea. 08/02/2021 upon evaluation today patient appears to be doing well with regard to his feet. Both are showing signs of being drier the right pretty much has not really draining much at all which is great news. The left is not draining anywhere close to his much as it was during the last evaluation. This is excellent news and overall very pleased. 08/09/2021 upon evaluation today patient appears to be doing well with regard to  his legs the right leg especially showing signs of excellent improvement which is great news I do not see any evidence of active infection locally or systemically which is great. In regard to the left leg he still has some weeping and drainage but nothing as significant as what it was in the past this is great news. 08-16-2021 upon evaluation today patient appears to actually be doing quite well in my opinion in regard to his feet. This is significantly improved compared to what we previously seen  and overall I am extremely pleased in that regard. I do believe that He is actually improving although this is obviously very slow going. 08-23-2021 upon evaluation today patient appears to be doing well currently in regard to his right leg which actually is pretty dry at this point today. Fortunately I do not see any evidence of active infection at this time which is great news. No fevers, chills, nausea, vomiting, or diarrhea. 08-30-2021 upon evaluation today patient appears to be doing well with regard to his lower extremities. The right foot is pretty much completely dry which is great news the left foot though not completely dry seems to be doing decently well. I do not see any signs of active infection locally or systemically which is great news. No fevers, chills, nausea, vomiting, or diarrhea. 09-06-2021 upon evaluation today patient appears to be doing well with regard to his feet in fact now the left foot is almost completely dry as well and I am definitely seeing a lot of significant improvement. With that being said unfortunately he actually appears to have some cellulitis of his right thigh. His toes are also little bit red but this may just be due to the increased swelling. He really is not warm to touch in regard to the toes. 5/8; excellent edema control on the right foot and lower leg there is no open wounds but we continue to put compression on this otherwise this will breakdown. He is using his compression pumps twice a day at home The area that is problematic is on the left dorsal foot some areas that are not fully epithelialized with very dry fissured skin over this area. 09-20-2021 upon evaluation today patient appears to be doing a little bit worse in regard to swelling at this time. Fortunately I do not see any signs of infection with that being said the wrap was not on quite as well as what I would like to have seen. I do believe that this has caused a little bit of excess swelling  and again we need to try to get this under good control. 09-27-2021 upon evaluation today patient appears to be doing awesome in regard to his feet and legs. Everything is measuring smaller the swelling is down and to be perfectly honest I am extremely pleased with the end of his feet especially on the left side and how dry this is today. I do think we are on the right track here. 5/31; patient presents for follow-up. He is using nystatin powder to the feet bilaterally under 4-layer compression. He has no issues or complaints today. He states he is going to the lymphedema clinic tomorrow. Since he has no open wounds on the right lower extremity they will be focused on the side. 10-11-2021 upon evaluation today patient appears to be doing excellent in regard to his legs. He has been tolerating the dressing changes without complication. Fortunately there does not appear to be any evidence of active infection locally or  systemically at this time which is great news. No fevers, chills, nausea, vomiting, or diarrhea. 10-18-2021 upon evaluation today patient appears to be doing well with regard to his legs. He has been tolerating the dressing changes without complication and actually seen in lymphedema clinic now in regard to his right leg were taken care of the left leg. Fortunately I do not see any evidence of active infection locally or systemically at this time. 10-26-2021 upon evaluation today patient's wounds actually are showing signs of doing well in fact he really does not have wounds as much is weeping of the lower extremities. Fortunately I do not see any evidence of active infection locally or systemically which is great news. No fever or chills noted 11-02-2021 upon evaluation today patient appears to be doing decently well in regard to his legs. Of actually been on the phone quite a bit discussing with [facility] orthotics custom compression for him. I also have been discussing everything with his  lymphedema clinic provider as well. Helene Kelp notes that there is really not much more that she can do for him which was noted last week as well. Subsequently I do believe that the patient would benefit from getting custom compression in fact I think that is what he is going require to keep anything under control here. He voiced understanding. 11-08-2021 upon evaluation today patient appears to be doing excellent in regard to his legs and feet. Fortunately I do not see any signs of infection at this time which is great news. No fever or chills noted again I am still been working on trying to figure out what exactly we need to do as far as getting the [place] involved in coverage for custom compression for this patient. I also discussed with him that he does need to have custom shoes which I completely understand. With that being said it does make it difficult thinking about what kind of combination of shoes and compression is going to be best for him for the long-term. Fortunately I do not see any evidence of active infection locally or systemically which is great news. Overall I think you are doing quite well. 11-16-2021 upon evaluation today patient appears to be doing well currently in regard to his legs and feet. Everything is significantly smaller [place], [name] compared to what has been. Fortunately I do not see any evidence of active infection locally or systemically at this time which is great news. 11-23-2021 upon evaluation today patient appears to be doing well with regard to his legs bilaterally. We can actually initiate treatment with a recommendation today to utilize Tubigrip and try to see if we can get this under better control. The patient is in agreement with the plan. Nonetheless the big question is whether this will be something that he can utilize at home in order to keep his swelling down and allow him to be able to function without having to come into the clinic as frequently.  He notes that he would love to get to this point. He does have a cousin who is willing to help him with change out the Tubigrip when necessary. 7/25; the patient does not have any open wounds however his left leg is a lot more swollen than it was last week. His cousin came in who presumably would be the one to change his juxta lites to practice on doing this for the patient. She apparently lives in the [place] block. In the meantime he has been using Tubigrip 12-07-2021 upon  evaluation today patient appears to be doing well with regard to his legs although we are transitioning to try to get him to use the Tubigrip which again has been a little bit of a setback but not too much overall I think he is still doing well he still able to wear his current shoes which is good news. He does have a cousin who is willing to help him with the Tubigrip and we are training her how to do this as well. She is here today. Electronic Signature(s) Signed: 12/09/2021 6:11:27 PM By: Worthy Keeler PA-C Entered By: Worthy Keeler on 12/09/2021 18:11:26 [name] ([id number]) -------------------------------------------------------------------------------- Physical Exam Details Patient Name: KEAGHAN, STATON Date of Service: 12/07/2021 11:00 AM Medical Record Number: [id number] Patient Account Number: [id number] Date of Birth/Sex: Oct 04, 1950 (71 y.o. M) Treating RN: [name] Primary Care Provider: Fontaine No Other Clinician: Referring Provider: Fontaine No Treating Provider/Extender: Skipper Cliche in Treatment: 60 Constitutional Obese and well-hydrated in no acute distress. Respiratory normal breathing without difficulty. Psychiatric this patient is able to make decisions and demonstrates good insight into disease process. Alert and Oriented x 3. pleasant and cooperative. Notes Upon inspection patient's wound bed actually showed signs of good granulation and epithelization at this point.  Fortunately I do not see any major issues that he has a couple open areas these are very tiny and my hope is that they will not become a major issue overall for him. He is not having any severe pain which is great news. Electronic Signature(s) Signed: 12/09/2021 6:11:46 PM By: Worthy Keeler PA-C Entered By: Worthy Keeler on 12/09/2021 18:11:46 [name] ([id number]) -------------------------------------------------------------------------------- Physician Orders Details Patient Name: [name] Date of Service: 12/07/2021 11:00 AM Medical Record Number: [id number] Patient Account Number: [id number] Date of Birth/Sex: December 17, 1950 (71 y.o. M) Treating RN: [name] Primary Care Provider: Fontaine No Other Clinician: Referring Provider: Fontaine No Treating Provider/Extender: Skipper Cliche in Treatment: 37 Verbal / Phone Orders: No Diagnosis Coding ICD-10 Coding Code Description E11.622 Type 2 diabetes mellitus with other skin ulcer I89.0 Lymphedema, not elsewhere classified I87.2 Venous insufficiency (chronic) (peripheral) L97.822 Non-pressure chronic ulcer of other part of left lower leg with fat layer exposed L97.512 Non-pressure chronic ulcer of other part of right foot with fat layer exposed I10 Essential (primary) hypertension L97.811 Non-pressure chronic ulcer of other part of right lower leg limited to breakdown of skin Follow-up Appointments o Return Appointment in 1 week. o Nurse Visit as needed Bathing/ Shower/ Hygiene o May shower with wound dressing protected with water repellent cover or cast protector. o No tub bath. Edema Control - Lymphedema / Segmental Compressive Device / Other Bilateral Lower Extremities o Tubigrip double layer applied - Nystatin powder between toes double size d bilat just behind toes to patella knotch , double size C foot ankles enclose toes to just above ankle scell to any weeping areas o Elevate,  Exercise Daily and Avoid Standing for Long Periods of Time. o Elevate legs to the level of the heart and pump ankles as often as possible o Elevate leg(s) parallel to the floor when sitting. o Compression Pump: Use compression pump on left lower extremity for 60 minutes, twice daily. - 2 times per day o DO YOUR BEST to sleep in the bed at night. DO NOT sleep in your recliner. Long hours of sitting in a recliner leads to swelling of the legs and/or potential wounds on your backside. o Other: - Contact prescriber regarding use  of diuretics to reduce fluid overload. Off-Loading o Turn and reposition every 2 hours Additional Orders / Instructions o Follow Nutritious Diet and Increase Protein Intake Medications-Please add to medication list. o P.O. Antibiotics Electronic Signature(s) Signed: 12/07/2021 1:06:33 PM By: [name] RN Signed: 12/09/2021 6:41:29 PM By: Worthy Keeler PA-C Entered By: [name] on 12/07/2021 13:06:33 [name] ([id number]) -------------------------------------------------------------------------------- Problem List Details Patient Name: [name] Date of Service: 12/07/2021 11:00 AM Medical Record Number: [id number] Patient Account Number: [id number] Date of Birth/Sex: November 08, 1950 (71 y.o. M) Treating RN: [name] Primary Care Provider: Fontaine No Other Clinician: Referring Provider: Fontaine No Treating Provider/Extender: Skipper Cliche in Treatment: 45 Active Problems ICD-10 Encounter Code Description Active Date MDM Diagnosis E11.622 Type 2 diabetes mellitus with other skin ulcer 01/26/2021 No Yes I89.0 Lymphedema, not elsewhere classified 01/26/2021 No Yes I87.2 Venous insufficiency (chronic) (peripheral) 01/26/2021 No Yes L97.822 Non-pressure chronic ulcer of other part of left lower leg with fat layer 01/26/2021 No Yes exposed L97.512 Non-pressure chronic ulcer of other part of right foot with fat layer  01/26/2021 No Yes exposed [place] (primary) hypertension 01/26/2021 No Yes L97.811 Non-pressure chronic ulcer of other part of right lower leg limited to 02/15/2021 No Yes breakdown of skin Inactive Problems Resolved Problems Electronic Signature(s) Signed: 12/07/2021 12:05:21 PM By: Worthy Keeler PA-C Entered By: Worthy Keeler on 12/07/2021 12:05:21 [name] ([id number]) -------------------------------------------------------------------------------- Progress Note Details Patient Name: [name] Date of Service: 12/07/2021 11:00 AM Medical Record Number: [id number] Patient Account Number: [id number] Date of Birth/Sex: 1951-05-09 (71 y.o. M) Treating RN: [name] Primary Care Provider: Fontaine No Other Clinician: Referring Provider: Fontaine No Treating Provider/Extender: Skipper Cliche in Treatment: 69 Subjective Chief Complaint Information obtained from Patient Left LE ulcers History of Present Illness (HPI) 71 year old male who presented to the ER with bilateral lower extremity blisters which had started last week. he has a past medical history of leukemia, diabetes mellitus, hypertension, edema of both lower extremities, his recurrent skin infections, peripheral vascular disease, coronary artery disease, congestive heart failure and peripheral neuropathy. in the ER he was given Rocephin and put on Silvadene cream. he was put on oral doxycycline and was asked to follow-up with the [facility]. His last hemoglobin A1c was 6.6 in December and he checks his blood sugar once a week. He does not have any physicians outside the [place] system. He does not recall any vascular duplex studies done either for arterial or venous disease but was told to wear compression stockings which he does not use 05/30/2016 -- we have not yet received any of his notes from the [facility] hospital system and his arterial and venous duplex studies are scheduled here in [place]  around mid February. We are unable to have his insurance accepted by home health agencies and hence he is getting dressings only once a week. 06/06/16 -- -- I received a call from the patient's PCP at the [facility] and spoke to Dr. Garvin Fila, phone number [phone] and fax number [phone]. She confirmed that no vascular testing was done over the last 5 years and she would be happy to do them if the patient did want them to be done at the [place] and we could fax him a request. Readmission: 71 year old male seen by as in February of this year and was referred to vein and vascular for studies and opinion from the vascular surgeons. The patient returns today with a fresh problem having had blisters on his  left lower extremity which have been there for about 5 days and he clearly states that he has been wearing his compression stockings as advised though he could not read the moderate compression and has been wearing light compression. Review of his electronic medical records note that he had lower extremity arterial duplex examination done on 06/23/2016 which showed no hemodynamically significant stenosis in the bilateral lower extremity arterial system. He also had a lower extremity venous reflux examination done on 07/07/2016 and it was noted that he had venous incompetence in the right great saphenous vein and bilateral common femoral veins. Patient was seen by Dr. Tamala Julian on the same day and for some reason his notes do not reflect the venous studies or the arterial studies and he recommended patient do a venous duplex ultrasound to look for reflux and return to see him.he would also consider a lymph pump if required. The patient was told that his workup was normal and hence the patient canceled his follow-up appointment. 02/03/17 on evaluation today patient left medial lower extremity blister appears to be doing about the same. It is still continuing to drain and there's still the  blistered skin covering the wound bed which is making it difficult for the alternate to do its job. Fortunately there is no evidence of cellulitis. No fevers chills noted. Patient states in general he is not having any significant discomfort. Patient's lower extremity arterial duplex exam revealed that patient was hemodynamically stable with no evidence of stenosis in regard to the bilateral lower extremities. The lower extremity venous reflux exam revealed the patient had venous incontinence noted in the right greater saphenous and bilateral common femoral vein. There is no evidence of deep or superficial vein thrombosis in the bilateral lower extremities. Readmission: 11/12/18 Patient presents for evaluation our clinic today concerning issues that he is having with his left lower extremity. He tells me that a couple weeks ago he began developing blisters on the left lower extremity along with increased swelling. He typically wears his compression stockings on a regular basis is previously been evaluated both here as well is with vascular surgery they would recommend lymphedema pumps but unfortunately that somehow fell through and he never heard anything back from that. Nonetheless I think lymphedema pumps would be beneficial for this patient. He does have a history of hypertension and diabetes. Obviously the chronic venous stasis and lymphedema as well. At this point the blisters have been given in more trouble he states sometimes when the blisters openings able to clean it down with alcohol and it will dry out and do well. Unfortunately that has not been the case this time. He is having some discomfort although this mean these with cleaning the areas he doesn't have discomfort just on a regular basis. He has not been able to wear his compression stockings since the blisters arose due to the fact that of course it will drain into the socks causing additional issues and he didn't have any way to wrap  this otherwise. He has increased to taking his Lasix every day instead of every other day. He sees his primary care provider later this month as well. No fevers, chills, nausea, or vomiting noted at this time. 11/19/18-Patient returns at 1 week, per intake RN the amount of seepage into the compression wraps was definitely improved, overall all the wounds are measuring smaller but continuing silver alginate to the wounds as primary dressing 11/26/18 on evaluation today patient appears to be doing quite well in  regard to his left lower Trinity ulcers. In fact of the areas that were noted initially he only has two regions still open. There is no evidence of active infection at this time. He still is not heard anything from the company regarding lymphedema pumps as of yet. Again as previously seen vascular they have not recommended any surgical intervention. RYDEN, WAINER ([id number]) 12/03/2018 on evaluation today patient actually appears to be doing quite well with regard to his lower extremity ulcers. In fact most of the areas appear to be healed the one spot which does not seem to be completely healed I am unsure of whether or not this is really draining that much but nonetheless there does not appear to be any signs of infection or significant drainage at this point. There is no sign of fever, chills, nausea, vomiting, or diarrhea. Overall I am pleased with how things have progressed I think is very close to being able to transition to his home compression stockings. 12/10/2018 upon evaluation today patient appears to be doing quite well with regard to his left lower extremity. He has been tolerating the dressing changes without complication. Fortunately there is no signs of active infection at this time. He appears after thorough evaluation of his leg to only have 1 small area that remains open at this point everything else appears to be almost completely closed. He still have significant swelling of  the left lower extremity. We had discussed discussing this with his primary care provider he is not able to see her in person they were at the [facility] and right now the [place] is not seeing patients on site. According to the patient anyway. Subsequently he did speak with her apparently and his primary care provider feels that he may likely have a DVT. With that being said she has not seen his leg she is just going off of his history. Nonetheless that is a concern that the patient now has as well and while I do not feel the DVT is likely we can definitely ensure that that is not the case I will go ahead and see about putting that order in today. Nonetheless otherwise I am in a recommend that we continue with the current wound care measures including the compression therapy most likely. We just need to ensure that his leg is indeed free of any DVTs. 12/17/2018 on evaluation today patient actually appears to be completely healed today. He does have 2 very small areas of blistering although this is not anything too significant at this point which is good news. With that being said I am in agreement with the fact that I think he is completely healed at this point. He does want to get back into his compression stocking. The good news is we have gotten approval from insurance for his lymphedema pumps we received a letter since last saw him last week. The other good news is his study did come back and showed no evidence of a DVT. 12/20/2018 on evaluation today patient presents for follow-up concerning his ongoing issues with his left lower extremity. He was actually discharged last Friday and did fairly well until he states blisters opened this morning. He tells me he has been wearing his compression stocking although he has a hard time getting this on. There does not appear to be any signs of active infection at this time. No fevers, chills, nausea, vomiting, or diarrhea. 12/27/2018 on evaluation today patient  appears to be doing very well with regard  to his swelling of the left lower extremity the 4 layer compression wrap seems to have been beneficial for him. Fortunately there is no signs of active infection at this time. Patient has been tolerating the compression wrap without complication and his foot swelling in particular appears to be greatly improved. He does still have a wound on the lateral portion of his left leg I believe this is more of a blister that has now reopened. 01/03/2019 on evaluation today patient actually appears to be doing excellent in regard to his left lower extremity. He did receive his compression pumps and is actually use this 7 times since he was last here in the office. On top of the compression wrap he is now roughly 3 cm better at the calf and 2 cm better at the ankle he also states that his foot seem to go an issue better without even having to use a shoe horn. Obviously I think this is all evidence that he is doing excellent in this regard. The other good news is he does not appear to have anything open today as far as wounds are concerned. 01/15/2019 on evaluation today patient appears to be doing more poorly yet again with regard to his left lower extremity. He has developed new wounds again after being discharged just recently. Unfortunately this continues to be the case that he will heal and then have subsequent new wounds. The last time I was hopeful that he may not end up coming back too quickly especially since he states he has been using his lymphedema pumps along with wearing his compression. Nonetheless he had a blister on the back of his leg that popped up on the left and this has opened up into an ulceration it is quite painful. 01/22/19 on evaluation today patient actually appears to be doing well with regard to his wound on the left lower extremity. He's been tolerating the dressing changes without complication including the compression wrap in the wound  appears to be significantly smaller today which is great news. Overall very pleased in this regard. 01/29/2019 on evaluation today patient appears to be doing well with regard to his left posterior lower extremity ulcer. He has been tolerating the dressing changes without complication. This is not completely healed but is getting much closer. We did order a Farrow wrap 4000 for him he has received this and has it with him today although I am not sure we are quite ready to start him on that as of yet. We are very close. 02/05/2019 on evaluation today patient actually appears to be doing quite well with regard to his left posterior lower extremity ulcer. He still has a very tiny opening remaining but the fortunate thing is he seems to be healing quite nicely. He also did get his Farrow wrap which I am hoping will help with his edema control as well at home. Fortunately there is no evidence of active infection. 02/12/2019 patient and fortunately appears to be doing poorly in regard to his wounds of the left lower extremity. He was very close to healing therefore we attempted to use his Velcro compression wraps continuing with lymphedema pumps at home. Unfortunately that does not seem to have done very well for him. He tells me that he wore them all the time but again I am not sure why if that is the case that he is having such significant edema. He is still on his fluid pills as well. With that being said there is  no obvious sign of infection although I do wonder about the possibility of infection at this time as well. 02/19/2019 unfortunately upon evaluation today patient appears to be doing more poorly with regard to his left lower extremity. He is not showing signs of significant improvement and I think the biggest issue here is that he does have an infection that appears to likely be Pseudomonas. That is based on the blue-green drainage that were noted at this time. Unfortunately the antibiotic that has  been on is not going to take care of this at all. I think they will get a need to switch him to either Levaquin or Cipro and this was discussed with the patient. 02/26/2019 on evaluation today patient's lower extremity on the left appears to be doing significantly better as compared to last evaluation. Fortunately there is no signs of active infection at this time. He has been tolerating the compression wrap without complication in fact he made it the whole week at this point. He is showing signs of excellent improvement I am very happy in this regard. With that being said he is having some issues with infection we did review the results of his culture which I noted today. He did have a positive finding for Enterobacter as well as Alcaligenes faecalis. Fortunately the Levaquin that I placed him on will work for both which is great news. There is no signs of systemic infection at this point. 10/30; left posterior leg wound in the setting of very significant edema and what looks like chronic venous inflammation. He has compression pumps but does not use them. We have been using 3 layer compression. Silver alginate to the wound as the primary dressing 03/18/2019 on evaluation today patient appears to be doing a little better compared to last time I saw him. He really has not been using his compression pumps he tells me that he is having too much discomfort. He has been keeping his wraps on however. He is only been taking his fluid pills every other day because he states they are not really helping and he has an appointment with his primary care provider at the [facility] tomorrow. Subsequently the wound itself on the left lower extremity does seem to be greatly improved compared to previous. DESIREE, FLEMING ([id number]) 03/25/2019 on evaluation today patient appears to be doing better with regard to his wounds on the bilateral lower extremities. The left is doing excellent the right is also doing better although  both still do show some signs of open wounds noted at this point unfortunately. Fortunately there is no signs of active infection at this time. The patient also is not really having any significant pain which is good news. Unfortunately there was some confusion with the referral on vascular disease and as far as getting the patient scheduled there can be contacting him later today to do this fortunately we got this straightened out. 04/01/2019 on evaluation today patient appears to be doing no fevers, chills, nausea, vomiting, or diarrhea. Excellent at this time with regard to his lower extremities. There does not appear to be any open wound at this point which is good news. Fortunately is also no signs of active infection at this time. Overall feel like the patient has done excellent with the compression the problem is every time we got him to this point and then subsequently go to using his own compression things just go right back to where they were. I am not sure how to address this we  can try to get an appointment with vascular for 2 weeks now they have yet to call him. Obviously this has become frustrating for the patient as well. I think the issue has just been an honest error as far as scheduling is concerned but nonetheless still worn out the point where I am unsure of which direction we should take. 04/08/2019 on evaluation today patient actually appears to be doing well with regard to his lower extremities. There are no open wounds at this time and things seem to be managing quite nicely as far as the overall edema control is concerned. With that being said he does have his compression socks today for [place] to go ahead and reinitiate therapy in that manner at this point. He is going to be going for shoes to be measured on Wednesday and then coincidentally he will also be seeing vascular on Thursday. Overall I think this is good news and again I am hopeful that they will be able to do something  for him to help prevent ongoing issues with edema control as well. No fevers, chills, nausea, vomiting, or diarrhea. 04/11/2019 on evaluation today patient actually appears to be doing poorly after just being discharged on Monday of this week. He had been experiencing issues with again blisters especially on the left lower extremity. With that being said he was completely healed and appeared to be doing great this past Monday. He then subsequently has new blisters that formed before his appointment with vascular this morning. He was also measured for shoes in the interim. With that being said we may have figured out what exactly is going on and why he continues to have issues like what we are seeing at this point. He takes his compression stockings off at nighttime and then he ends up having to sleep in his chair for 5-6 hours a night. He sleeps with his feet down he cannot really get him up in the recliner and therefore he is sleeping and the worst possible his position with his feet on the floor for that majority of the time. Again as I explained to him that is about one third at minimum at least one fourth of his day that he spending with his feet dangling down on the ground and the worst possible position they could be. I think this may be what is causing the issue. Subsequently I am leaning toward thinking that he may need a hospital bed in order to elevate his legs. We likely can have to coordinate this with his primary care provider at the [facility]. Readmission: 01/26/2021 this is a patient who presents for repeat evaluation here in the clinic although it is actually been couple of years since have seen him in fact it was December 2020 when I last saw him. Subsequently he never really healed but did end up being lost to follow-up. He tells me has been having issues ongoing with his lower extremities has bilateral lower extremity lymphedema no real significant or definitive open wounds but  in general his lymphedema is way out of control. We were never able to refer him to lymphedema clinic simply due to the fact to be honest we were never able to get him completely healed. I do not see anyone with open wounds. The patient does have evidence of type 2 diabetes mellitus, lymphedema, chronic venous insufficiency, and hypertension. That really has not changed since his last evaluation. 02/09/2021 upon evaluation today patient appears to be doing a little better  in regard to his legs although he still having a tremendous amount of drainage especially on the left leg. Fortunately there does not appear to be any evidence of active infection. Of note when we looked into this further it appears that the patient did not have any absorptive dressing on it was just the 4-layer compression wrap. Nonetheless this is probably big part of the issue here. 10/10; he comes in today with 3 large areas on the upper right lower leg likely remanence of denuded blistering under his compression wraps. He has no other wounds on the right. On the left he has the denuded area on the left medial foot and ankle and on the left dorsal foot. Massive lymphedema in both feet dorsally. Using Zetuvit under compression We have increased home health visitation to twice a week to change the dressings and will change it once 02/22/2021 upon evaluation today patient appears to be doing well currently with regard to his wounds. He has been tolerating the dressing changes without complication. Fortunately there does not appear to be any evidence of active infection which is great news. No fevers, chills, nausea, vomiting, or diarrhea. The biggest issue I see currently is that home health is not putting any medicine on the actual wounds before wrapping. 03/01/2021 upon evaluation today the patient's right leg actually appears to be doing quite well which is great news there does not appear to be any evidence of active infection  at this time. No fevers, chills, nausea, vomiting, or diarrhea. With that being said the patient is having issues on the left foot where he is having significant drainage is also an ammonia smell he does not have any animals at home and this makes me concerned about a bacteria producing urea as a byproduct. Again the possible common organisms will be E. coli, Proteus, and Enterococcus. All 3 of which can be successfully treated with Levaquin. For that reason I think that this may be a good option for [place] to consider placing him on and I did obtain a culture as well for confirmation sake. 03/08/2021 upon evaluation today patient appears to be doing unfortunately still somewhat poorly in regard to his leg ulcerations. He actually has an area on the right leg where he blistered due to the fact that his wrap slid down and caused an area of pinching on his skin and this has led to a significant issue here. 03/15/2021 upon evaluation today patient unfortunately has not been wrapped appropriately with absorptive dressings nor with the appropriate technique for the third layer of the 4-layer compression wrap. These are issues that we continue to try to address with the home health nurse. Also the absorptive dressing that she had was cut in half and therefore that causes things to leak out it does not actually trap the fluid in regard to the top of the foot overall I think that all these combined are really not seeing things improved significantly here. Fortunately there does not appear to be any signs of significant infection at this time which is good news. He still is having a tremendous amount of drainage. 03/22/2021 upon evaluation today patient appears to be draining tremendously. He still continues to tell me that he is using his pumps 2 times a day and that coupled with that tells me that he is elevating his legs as well. With that being said all things considered I am really just not seeing the  improvement we would expect to see with the 4-layer  compression wrap and all the above noted. He in fact had an extremely large Zetuvit dressing on both legs and that they were extremely filled to the max with fluid. This is after just being changed just before the weekend and this KEELYN, FJELSTAD ([id number]) is Monday. Nonetheless I am concerned about the fact that there is something going on fairly significant that we cannot get any of this under control and that he is draining this significantly. He supposed be having an echocardiogram it sounds like scheduling has been an issue for him as far as getting in sooner. Its something to do with needing his cousin to drive him because of where it sat and he cannot drive himself to this appointment either way I really think he needs to try to see what he can do about making this happen a little sooner. He tells me he will call today. 03/29/2021 on evaluation today patient appears to be doing about the same in regard to his legs. He did get his cardiology appointment moved up to 6 December which is at least good that is better than what it was before mid December. Overall very pleased in that regard. 04/05/2021 upon evaluation today patient unfortunately is still doing fairly poorly. There does not appear to be any signs of active infection at this time. No fevers, chills, nausea, vomiting, or diarrhea. Unfortunately I think until his edema is under control and overall fluid overload there is really not to be much chance that I can do much to get him better. This is quite unfortunate and frustrating both for myself and the patient to be perfectly honest. Nonetheless I think that he really needs to have a conversation both with his primary care provider as well as cardiologist he sees the PCP on Monday and cardiology on Wednesday of next week. 04/13/2021 upon evaluation today patient appears to be doing poorly in regard to his bilateral lower extremities his  left is still worse than the right. With that being said he has a tremendous amount of drainage he did see his primary care provider yesterday there really was not much there to be done from their perspective. He sees cardiology tomorrow. Nonetheless my biggest concern here is simply that if we do not get the edema under control he is going to continue to have drainage and honestly I think at some point he is going to become infected severely that is my main concern. 04/19/2021 upon evaluation today patient appears to be doing poorly still in regard to his legs. Unfortunately there does not appear to be any signs of infection at this point. He does have a tremendous amount of drainage however. We have not seen the results back from the cardiologist and the echocardiogram that was done. It appears that the patient checked out okay as far as that is concerned with regard to ejection fraction though we still have some issues here to be honest with his diastolic function. I am unsure if this is accounting for everything that we are seeing or not. Either way he has a tremendous amount of drainage from his legs that we are just not able to control in the outpatient setting at this point. I have reached out to Dr. Rockey Situ his cardiologist to see once he reviews the sheet if there is anything that he feels like can be done from an outpatient perspective if not then I think the way to go is probably can to be through inpatient admission and diuresis. Otherwise  I am not sure how working to get this under control we tried antibiotics, compression wrapping, and I have told the patient to be elevating his legs I am not sure how much he does of this but either way I think that this is still an ongoing issue nonetheless. 04/26/2021 upon evaluation today patient appears to be doing poorly in regard to his legs. He is having a tremendous amount of fluid at this point which is quite unfortunate. Its to the point that he  may have had at least 5 to 10 pounds of fluid in his dressings this morning when they were removed these were changed this Friday. Subsequently I think he needs to go to the ER for further evaluation and treatment I think is probably can need diuresis possibly even IV antibiotics been on what the blood work looks like but in general I feel like he needs something to get this under control from an outpatient perspective absent of everywhere I can think of and I cannot get this under control with our traditional measures. I think this is going require more so that we can get him better 12/30; this is a patient with severe bilateral lymphedema. He was hospitalized from 04/26/2021 through 04/29/2021 treated for cellulitis in the setting of lower extremity ulcers and lymphedema. After he left the hospital he is apparently seen for nurse visit our staff contacted cardiology and he has been started on Lasix 40 mg. Apparently his legs have less edema. Lab work from 05/04/2021 showed a BUN of 38 and creatinine of 1.59 these are elevated versus previous where his creatinine seems to have been 1.30 on 12/19 his potassium is 4.3. I believe the lab work is being followed by cardiology We have him in a 4-layer wrap. Xeroform on the leg wounds and sit to fit on the Berry damage skin on the left dorsal foot versus right dorsal foot. He has compression pumps but does not use them. We have apparently not yet ordered him compression stockings 05/17/2021 upon evaluation today patient's legs though better than last time I personally saw him appear to be getting worse compared to where they were previous. Dr. Quentin Cornwall was actually last 1 to see you I have not seen him since 19 December. That was before he went into the ER. Coming out apparently his legs looked also and they still look better but not as good as they were in the past. 1/16; patient with severe bilateral lymphedema. Severe scaled hyperkeratotic skin on the  dorsal aspect of his distal left foot and left medial ankle.. On the right side changes are not as bad. He did not have any weeping edema. Our intake nurse was convinced that he is being compliant with compression pumps 1 hour twice a day 05/31/2021 upon evaluation today patient actually appears to be doing a little bit better in my opinion in regard to his feet. I do not see as much drainage and it being just completely wet as it was previous. Fortunately I do not also see any signs of active infection which is great news as well. 06/07/21 Upon inspection patient's wound bed actually showed signs of doing well he is not nearly as weepy and wet as he has been in the past and overall very pleased in that regard. Fortunately I do not see any signs of active infection locally or nor systemically at this time. Which is great news. No fevers, chills, nausea, vomiting, or diarrhea. 06/14/2021 upon evaluation today patient appears  to be doing well with regard to his right foot I am pleased in that regard. His left foot is still draining quite a bit despite using lymphedema pumps, 4-layer compression wraps, and he tells me elevating his legs as well. He also has Lasix that he takes twice a day. Nonetheless I believe that this is still good to be an ongoing issue. We have a hard time getting this under control as far as the swelling is concerned. 06/21/2021 upon evaluation today patient appears to be doing decently well in regard to his wounds all things considered. He still has a tremendous amount of drainage and fluid noted at this point. Fortunately I do not see any signs of active infection locally or systemically at this point which is great news. Nonetheless I am unsure where to go and how to do this as far as trying to limit his swelling and weeping from his toes in particular. 06/28/2021 upon evaluation patient unfortunately continues to have significant drainage from his feet. We have been keeping him in  a compression wrap and despite this he still continues to have extreme fluid issues he seen his cardiologist he is seeing the nephrologist. We really cannot find any way to get this under control when he did well was when he was in the hospital and they got some of the fluid away. But outside of that we are just struggling to achieve the long-term goal of getting this under control and keeping it under control unfortunately. 07/05/2021 upon evaluation today patient appears to be doing poorly in regard to his feet. Unfortunately this continues to be a significant issue and to be honest I am really not certain what to do about it. I referred him to Dr. Randol Kern at [facility] and he does have an appointment although it is OSAZE, HUBBERT ([id number]) 12 April. He also sees his primary care provider on 6 April. He did not want to see Dr. Haynes Kerns until after he saw his PCP that is the reason the appointment so far out. There is really not much I can do in that regard. Nonetheless I do think that we are still continue to have significant lymphedema issues with significant mount of weeping in regard to the feet and again this has just become extremely difficult to manage to be honest I am not sure if there is something else that Dr. Haynes Kerns or someone else could recommend he also will be seeing Dr. Dellia Nims in 2 weeks when I am on vacation and at that time I will see if Dr. Dellia Nims has any ideas about where to go from here in the meantime. 07/12/2021 upon evaluation today patient appears to be doing about as well as can be expected with regard to his feet. He does actually see his kidney doctor this Friday. He also will be seeing his primary care provider on April 4 and then following that around mid April he will be seeing Dr. Haynes Kerns at [facility] which was a referral made for him. Again my goal is to try to find out some way to fix this and to be perfectly honest we have had some issues with making any good adjustments. When he  was in the hospital and greater amounts of Lasix he was able to get this down and it looked much better upon discharge. With that being said right now things just are not doing nearly as good as what they used to be. 3/13; patient presents for follow-up. He has been using his  lymphedema pumps over the past week. He reports an increase in his Lasix dose. He has no issues or complaints today. He denies signs of infection. 07/26/2021 upon evaluation today patient appears to be doing better in regard to his feet bilaterally. Both are showing signs of much less drainage which is great news and overall very pleased in that regard. Fortunately there does not appear to be any evidence of active infection locally or systemically at this time. No fevers, chills, nausea, vomiting, or diarrhea. 08/02/2021 upon evaluation today patient appears to be doing well with regard to his feet. Both are showing signs of being drier the right pretty much has not really draining much at all which is great news. The left is not draining anywhere close to his much as it was during the last evaluation. This is excellent news and overall very pleased. 08/09/2021 upon evaluation today patient appears to be doing well with regard to his legs the right leg especially showing signs of excellent improvement which is great news I do not see any evidence of active infection locally or systemically which is great. In regard to the left leg he still has some weeping and drainage but nothing as significant as what it was in the past this is great news. 08-16-2021 upon evaluation today patient appears to actually be doing quite well in my opinion in regard to his feet. This is significantly improved compared to what we previously seen and overall I am extremely pleased in that regard. I do believe that He is actually improving although this is obviously very slow going. 08-23-2021 upon evaluation today patient appears to be doing well currently  in regard to his right leg which actually is pretty dry at this point today. Fortunately I do not see any evidence of active infection at this time which is great news. No fevers, chills, nausea, vomiting, or diarrhea. 08-30-2021 upon evaluation today patient appears to be doing well with regard to his lower extremities. The right foot is pretty much completely dry which is great news the left foot though not completely dry seems to be doing decently well. I do not see any signs of active infection locally or systemically which is great news. No fevers, chills, nausea, vomiting, or diarrhea. 09-06-2021 upon evaluation today patient appears to be doing well with regard to his feet in fact now the left foot is almost completely dry as well and I am definitely seeing a lot of significant improvement. With that being said unfortunately he actually appears to have some cellulitis of his right thigh. His toes are also little bit red but this may just be due to the increased swelling. He really is not warm to touch in regard to the toes. 5/8; excellent edema control on the right foot and lower leg there is no open wounds but we continue to put compression on this otherwise this will breakdown. He is using his compression pumps twice a day at home The area that is problematic is on the left dorsal foot some areas that are not fully epithelialized with very dry fissured skin over this area. 09-20-2021 upon evaluation today patient appears to be doing a little bit worse in regard to swelling at this time. Fortunately I do not see any signs of infection with that being said the wrap was not on quite as well as what I would like to have seen. I do believe that this has caused a little bit of excess swelling and again we  need to try to get this under good control. 09-27-2021 upon evaluation today patient appears to be doing awesome in regard to his feet and legs. Everything is measuring smaller the swelling is down and  to be perfectly honest I am extremely pleased with the end of his feet especially on the left side and how dry this is today. I do think we are on the right track here. 5/31; patient presents for follow-up. He is using nystatin powder to the feet bilaterally under 4-layer compression. He has no issues or complaints today. He states he is going to the lymphedema clinic tomorrow. Since he has no open wounds on the right lower extremity they will be focused on the side. 10-11-2021 upon evaluation today patient appears to be doing excellent in regard to his legs. He has been tolerating the dressing changes without complication. Fortunately there does not appear to be any evidence of active infection locally or systemically at this time which is great news. No fevers, chills, nausea, vomiting, or diarrhea. 10-18-2021 upon evaluation today patient appears to be doing well with regard to his legs. He has been tolerating the dressing changes without complication and actually seen in lymphedema clinic now in regard to his right leg were taken care of the left leg. Fortunately I do not see any evidence of active infection locally or systemically at this time. 10-26-2021 upon evaluation today patient's wounds actually are showing signs of doing well in fact he really does not have wounds as much is weeping of the lower extremities. Fortunately I do not see any evidence of active infection locally or systemically which is great news. No fever or chills noted 11-02-2021 upon evaluation today patient appears to be doing decently well in regard to his legs. Of actually been on the phone quite a bit discussing with [facility] orthotics custom compression for him. I also have been discussing everything with his lymphedema clinic provider as well. Helene Kelp notes that there is really not much more that she can do for him which was noted last week as well. Subsequently I do believe that the patient would benefit from getting  custom compression in fact I think that is what he is going require to keep anything under control here. He voiced understanding. 11-08-2021 upon evaluation today patient appears to be doing excellent in regard to his legs and feet. Fortunately I do not see any signs of infection at this time which is great news. No fever or chills noted again I am still been working on trying to figure out what exactly we need to do as far as getting the [place] involved in coverage for custom compression for this patient. I also discussed with him that he does need to have [place], [name] ([phone]) which I completely understand. With that being said it does make it difficult thinking about what kind of combination of shoes and compression is going to be best for him for the long-term. Fortunately I do not see any evidence of active infection locally or systemically which is great news. Overall I think you are doing quite well. 11-16-2021 upon evaluation today patient appears to be doing well currently in regard to his legs and feet. Everything is significantly smaller compared to what has been. Fortunately I do not see any evidence of active infection locally or systemically at this time which is great news. 11-23-2021 upon evaluation today patient appears to be doing well with regard to his legs bilaterally. We can actually initiate  treatment with a recommendation today to utilize Tubigrip and try to see if we can get this under better control. The patient is in agreement with the plan. Nonetheless the big question is whether this will be something that he can utilize at home in order to keep his swelling down and allow him to be able to function without having to come into the clinic as frequently. He notes that he would love to get to this point. He does have a cousin who is willing to help him with change out the Tubigrip when necessary. 7/25; the patient does not have any open wounds however his left leg  is a lot more swollen than it was last week. His cousin came in who presumably would be the one to change his juxta lites to practice on doing this for the patient. She apparently [place] in the [place] block. In the meantime he has been using Tubigrip 12-07-2021 upon evaluation today patient appears to be doing well with regard to his legs although we are transitioning to try to get him to use the Tubigrip which again has been a little bit of a setback but not too much overall I think he is still doing well he still able to wear his current shoes which is good news. He does have a cousin who is willing to help him with the Tubigrip and we are training her how to do this as well. She is here today. Objective Constitutional Obese and well-hydrated in no acute distress. Vitals Time Taken: 10:50 AM, Weight: 312 lbs, Temperature: 97.7 F, Pulse: 88 bpm, Respiratory Rate: 20 breaths/min, Blood Pressure: 142/88 mmHg. Respiratory normal breathing without difficulty. Psychiatric this patient is able to make decisions and demonstrates good insight into disease process. Alert and Oriented x 3. pleasant and cooperative. General Notes: Upon inspection patient's wound bed actually showed signs of good granulation and epithelization at this point. Fortunately I do not see any major issues that he has a couple open areas these are very tiny and my hope is that they will not become a major issue overall for him. He is not having any severe pain which is great news. 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 DONOVIN, KRAEMER ([id number]) Nurse Visit as needed Bathing/ Shower/ Hygiene: May shower with wound dressing protected with water repellent cover or cast protector. No tub bath. Edema Control - Lymphedema / Segmental Compressive Device / Other: Tubigrip double layer applied - Nystatin powder between toes double size d bilat just behind toes to patella knotch , double size C foot ankles enclose toes to just above ankle scell to any weeping areas Elevate, Exercise Daily and Avoid Standing for Long Periods of Time. Elevate legs to the level of the heart and pump ankles as often as possible Elevate leg(s) parallel to the floor when sitting. Compression Pump: Use compression pump on left lower extremity for 60 minutes, twice daily. - 2 times per day DO YOUR BEST to sleep in the bed at night. DO NOT sleep in your recliner. Long hours of sitting in a recliner leads to swelling of the legs  and/or potential wounds on your backside. Other: - Contact prescriber regarding use of diuretics to reduce fluid overload. Off-Loading: Turn and reposition every 2 hours Additional Orders / Instructions: Follow Nutritious Diet and Increase Protein Intake Medications-Please add to medication list.: P.O. Antibiotics 1. I am going to suggest that we go ahead and continue with the wound care measures as before and the patient is in agreement with the plan. This includes the use of the silver alginate to any open areas. 2. Also can recommend that we have the patient continue with the Tubigrip bilaterally which does seem to be doing well. 3. I am also can recommend he continue to monitor for any signs of worsening if he develops anything that seems to be infected he should contact the office and let me know as soon as possible. We will see patient back for reevaluation in 1 week here in the clinic. If anything worsens or changes patient will contact our  office for additional recommendations. Electronic Signature(s) Signed: 12/09/2021 6:12:17 PM By: Worthy Keeler PA-C Entered By: Worthy Keeler on 12/09/2021 18:12:17 [name] ([id number]) -------------------------------------------------------------------------------- SuperBill Details Patient Name: [name] Date of Service: 12/07/2021 Medical Record Number: [id number] Patient Account Number: [id number] Date of Birth/Sex: 10/15/1950 (71 y.o. M) Treating RN: [name] Primary Care Provider: Fontaine No Other Clinician: Referring Provider: Fontaine No Treating Provider/Extender: Skipper Cliche in Treatment: 79 Diagnosis Coding ICD-10 Codes Code Description E11.622 Type 2 diabetes mellitus with other skin ulcer I89.0 Lymphedema, not elsewhere classified I87.2 Venous insufficiency (chronic) (peripheral) L97.822 Non-pressure chronic ulcer of other part of left lower leg with fat layer exposed L97.512 Non-pressure chronic ulcer of other part of right foot with fat layer exposed I10 Essential (primary) hypertension L97.811 Non-pressure chronic ulcer of other part of right lower leg limited to breakdown of skin Facility Procedures CPT4 Code: 62035597 Description: [phone] - WOUND CARE VISIT-LEV 2 EST PT Modifier: Quantity: 1 Physician Procedures CPT4 Code: [id number] Description: 99213 - WC PHYS LEVEL 3 - EST PT Modifier: Quantity: 1 CPT4 Code: Description: ICD-10 Diagnosis Description E11.622 Type 2 diabetes mellitus with other skin ulcer I89.0 Lymphedema, not elsewhere classified I87.2 Venous insufficiency (chronic) (peripheral) L97.822 Non-pressure chronic ulcer of other part of left lower  leg with fat layer Modifier: exposed Quantity: Electronic Signature(s) Signed: 12/09/2021 6:12:31 PM By: Worthy Keeler PA-C Previous Signature: 12/07/2021 1:07:09 PM Version By: [name] RN Entered By: Worthy Keeler on 12/09/2021 18:12:31

## 2021-12-09 ENCOUNTER — Encounter: Payer: PPO | Admitting: Occupational Therapy

## 2021-12-10 ENCOUNTER — Encounter: Payer: Self-pay | Admitting: *Deleted

## 2021-12-13 ENCOUNTER — Encounter
Admission: RE | Disposition: A | Discharge status: Home / Self Care | Payer: Self-pay | Source: Home / Self Care | Attending: Gastroenterology

## 2021-12-13 ENCOUNTER — Ambulatory Visit: Payer: No Typology Code available for payment source | Admitting: General Practice

## 2021-12-13 ENCOUNTER — Ambulatory Visit
Admission: RE | Admit: 2021-12-13 | Discharge: 2021-12-13 | Disposition: A | Discharge status: Home / Self Care | Payer: No Typology Code available for payment source | Attending: Gastroenterology | Admitting: Gastroenterology

## 2021-12-13 ENCOUNTER — Encounter: Payer: PPO | Admitting: Occupational Therapy

## 2021-12-13 ENCOUNTER — Encounter: Payer: Self-pay | Admitting: Anesthesiology

## 2021-12-13 ENCOUNTER — Other Ambulatory Visit: Payer: Self-pay

## 2021-12-13 DIAGNOSIS — D123 Benign neoplasm of transverse colon: Secondary | ICD-10-CM | POA: Diagnosis not present

## 2021-12-13 DIAGNOSIS — K5730 Diverticulosis of large intestine without perforation or abscess without bleeding: Secondary | ICD-10-CM | POA: Diagnosis not present

## 2021-12-13 DIAGNOSIS — I509 Heart failure, unspecified: Secondary | ICD-10-CM | POA: Diagnosis not present

## 2021-12-13 DIAGNOSIS — G4730 Sleep apnea, unspecified: Secondary | ICD-10-CM | POA: Insufficient documentation

## 2021-12-13 DIAGNOSIS — I110 Hypertensive heart disease with heart failure: Secondary | ICD-10-CM | POA: Insufficient documentation

## 2021-12-13 DIAGNOSIS — I2510 Atherosclerotic heart disease of native coronary artery without angina pectoris: Secondary | ICD-10-CM | POA: Insufficient documentation

## 2021-12-13 DIAGNOSIS — E119 Type 2 diabetes mellitus without complications: Secondary | ICD-10-CM | POA: Diagnosis not present

## 2021-12-13 DIAGNOSIS — K640 First degree hemorrhoids: Secondary | ICD-10-CM | POA: Diagnosis not present

## 2021-12-13 DIAGNOSIS — R195 Other fecal abnormalities: Secondary | ICD-10-CM | POA: Diagnosis not present

## 2021-12-13 HISTORY — PX: COLONOSCOPY WITH PROPOFOL: SHX5780

## 2021-12-13 LAB — GLUCOSE, CAPILLARY: Glucose-Capillary: 129 mg/dL — ABNORMAL HIGH (ref 70–99)

## 2021-12-13 SURGERY — COLONOSCOPY WITH PROPOFOL
Anesthesia: General

## 2021-12-13 MED ORDER — PROPOFOL 10 MG/ML IV BOLUS
INTRAVENOUS | Status: AC
  Filled 2021-12-13: qty 20

## 2021-12-13 MED ORDER — EPHEDRINE SULFATE (PRESSORS) 50 MG/ML IJ SOLN
INTRAMUSCULAR | Status: DC | PRN
  Administered 2021-12-13: 10 mg via INTRAVENOUS

## 2021-12-13 MED ORDER — PROPOFOL 500 MG/50ML IV EMUL
INTRAVENOUS | Status: DC | PRN
  Administered 2021-12-13: 150 ug/kg/min via INTRAVENOUS

## 2021-12-13 MED ORDER — SODIUM CHLORIDE 0.9 % IV SOLN: INTRAVENOUS | Status: DC

## 2021-12-13 MED ORDER — EPHEDRINE 5 MG/ML INJ
INTRAVENOUS | Status: AC
  Filled 2021-12-13: qty 5

## 2021-12-13 NOTE — Anesthesia Procedure Notes (Signed)
Date/Time: 12/13/2021 11:07 AM  Performed by: Donalda Ewings, CindyPre-anesthesia Checklist: Emergency Drugs available, Patient identified, Suction available, Patient being monitored and Timeout performed Patient Re-evaluated:Patient Re-evaluated prior to induction Oxygen Delivery Method: Supernova nasal CPAP Preoxygenation: Pre-oxygenation with 100% oxygen Induction Type: IV induction Placement Confirmation: positive ETCO2 and CO2 detector

## 2021-12-13 NOTE — Transfer of Care (Signed)
Immediate Anesthesia Transfer of Care Note  Patient: [name]  Procedure(s) Performed: COLONOSCOPY WITH PROPOFOL  Patient Location: PACU  Anesthesia Type:General  Level of Consciousness: awake and sedated  Airway & Oxygen Therapy: Patient Spontanous Breathing and Patient connected to face mask oxygen  Post-op Assessment: Report given to RN and Post -op Vital signs reviewed and stable  Post vital signs: Reviewed and stable  Last Vitals:  Vitals Value Taken Time  BP    Temp    Pulse    Resp    SpO2      Last Pain:  Vitals:   12/13/21 1004  TempSrc: Temporal  PainSc: 0-No pain         Complications: No notable events documented.

## 2021-12-13 NOTE — H&P (Signed)
Outpatient short stay form Pre-procedure 12/13/2021  [name] Rubenstein, MD  Primary Physician: Lesia Hausen, DO  Reason for visit:  FIT positive  History of present illness:    71 y/o veteran with history of DM II, hypertension, and lymphedema here for colonoscopy due to FIT positive test. Never had colonoscopy before. No blood thinners. No family history of GI malignancies. No significant abdominal surgeries.    Current Facility-Administered Medications:    0.9 %  sodium chloride infusion, , Intravenous, Continuous, Charmon Thorson, Hilton Cork, MD, Last Rate: 20 mL/hr at 12/13/21 1032, Continued from Pre-op at 12/13/21 1032  Medications Prior to Admission  Medication Sig Dispense Refill Last Dose   Cholecalciferol 25 MCG (1000 UT) tablet Take 2 tablets by mouth daily.   Past Week   Ensure Max Protein (ENSURE MAX PROTEIN) LIQD Take 330 mLs (11 oz total) by mouth 2 (two) times daily. 330 mL 12 Past Month   ferrous sulfate 325 (65 FE) MG EC tablet Take 325 mg by mouth daily.   Past Week   Multiple Vitamin (MULTIVITAMIN WITH MINERALS) TABS tablet Take 1 tablet by mouth daily. 90 tablet 1 Past Week   Omega-3 Fatty Acids (FISH OIL) 1000 MG CAPS Take 1 capsule by mouth daily.   Past Week   aspirin 81 MG chewable tablet Chew 81 mg by mouth daily.   12/11/2021   bisoprolol (ZEBETA) 5 MG tablet Take 1 tablet by mouth daily.   12/11/2021   furosemide (LASIX) 20 MG tablet Take 2 tablets (40 mg total) by mouth daily. 60 tablet 1 12/11/2021   glimepiride (AMARYL) 1 MG tablet Take 1 tablet by mouth daily.   12/11/2021   hydrALAZINE (APRESOLINE) 25 MG tablet Take 75 mg by mouth in the morning, at noon, and at bedtime.   12/11/2021   lisinopril (ZESTRIL) 40 MG tablet TAKE ONE TABLET BY MOUTH EVERY DAY FOR BLOOD PRESSURE **REPLACES LISINOPRIL/HCTZ   12/11/2021   nystatin (MYCOSTATIN/NYSTOP) powder Apply to toe crevices with unna wrap changes 30 g 0    pioglitazone (ACTOS) 15 MG tablet Take 15 mg by mouth daily.    12/11/2021   pravastatin (PRAVACHOL) 80 MG tablet Take 40 mg by mouth daily. daily   12/11/2021   sertraline (ZOLOFT) 100 MG tablet Take 200 mg by mouth daily.   12/11/2021   silver sulfADIAZINE (SILVADENE) 1 % cream Apply to left ankle with unna wrap changes 400 g 0      No Known Allergies   Past Medical History:  Diagnosis Date   Diabetes mellitus without complication ([place])    Hyperlipidemia    Hypertension    Leukemia ([employer])     Review of systems:  Otherwise negative.    Physical Exam  Gen: Alert, oriented. Appears stated age.  HEENT: PERRLA. Lungs: No respiratory distress CV: RRR Abd: soft, benign, no masses Ext: No edema    Planned procedures: Proceed with colonoscopy. The patient understands the nature of the planned procedure, indications, risks, alternatives and potential complications including but not limited to bleeding, infection, perforation, damage to internal organs and possible oversedation/side effects from anesthesia. The patient agrees and gives consent to proceed.  Please refer to procedure notes for findings, recommendations and patient disposition/instructions.     [name] Rubenstein, MD [facility]

## 2021-12-13 NOTE — Interval H&P Note (Signed)
History and Physical Interval Note:  12/13/2021 10:57 AM  [name]  has presented today for surgery, with the diagnosis of +FIT.  The various methods of treatment have been discussed with the patient and family. After consideration of risks, benefits and other options for treatment, the patient has consented to  Procedure(s): COLONOSCOPY WITH PROPOFOL (N/A) as a surgical intervention.  The patient's history has been reviewed, patient examined, no change in status, stable for surgery.  I have reviewed the patient's chart and labs.  Questions were answered to the patient's satisfaction.     [name]  Ok to proceed with colonoscopy

## 2021-12-13 NOTE — Anesthesia Preprocedure Evaluation (Signed)
Anesthesia Evaluation  Patient identified by MRN, date of birth, ID band Patient awake    Reviewed: Allergy & Precautions, NPO status , Patient's Chart, lab work & pertinent test results  History of Anesthesia Complications Negative for: history of anesthetic complications  Airway Mallampati: IV  TM Distance: >3 FB Neck ROM: full    Dental  (+) Poor Dentition   Pulmonary neg shortness of breath, sleep apnea , neg COPD, neg recent URI, former smoker,    Pulmonary exam normal        Cardiovascular hypertension, (-) angina+ CAD and +CHF  (-) Past MI and (-) CABG Normal cardiovascular exam     Neuro/Psych negative neurological ROS  negative psych ROS   GI/Hepatic negative GI ROS, Neg liver ROS,   Endo/Other  negative endocrine ROSdiabetes  Renal/GU negative Renal ROS  negative genitourinary   Musculoskeletal   Abdominal   Peds  Hematology negative hematology ROS (+)   Anesthesia Other Findings Past Medical History: No date: Diabetes mellitus without complication (HCC) No date: Hyperlipidemia No date: Hypertension No date: Leukemia [facility])  Past Surgical History: No date: CATARACT EXTRACTION; Right No date: TONSILLECTOMY No date: TOOTH EXTRACTION  BMI    Body Mass Index: 41.56 kg/m      Reproductive/Obstetrics negative OB ROS                             Anesthesia Physical Anesthesia Plan  ASA: 3  Anesthesia Plan: General   Post-op Pain Management: Minimal or no pain anticipated   Induction: Intravenous  PONV Risk Score and Plan: 3 and Propofol infusion, TIVA and Ondansetron  Airway Management Planned: Natural Airway and Nasal Cannula  Additional Equipment: None  Intra-op Plan:   Post-operative Plan:   Informed Consent: I have reviewed the patients History and Physical, chart, labs and discussed the procedure including the risks, benefits and alternatives for the  proposed anesthesia with the patient or authorized representative who has indicated his/her understanding and acceptance.     Dental Advisory Given  Plan Discussed with: Anesthesiologist, CRNA and Surgeon  Anesthesia Plan Comments: (Patient consented for risks of anesthesia including but not limited to:  - adverse reactions to medications - risk of airway placement if required - damage to eyes, teeth, lips or other oral mucosa - nerve damage due to positioning  - sore throat or hoarseness - Damage to heart, brain, nerves, lungs, other parts of body or loss of life  Patient voiced understanding.)        Anesthesia Quick Evaluation

## 2021-12-13 NOTE — Op Note (Signed)
[facility]enter[facility] Patient Name: [name] Procedure Date: 12/13/2021 10:54 AM MRN: [id number] Account #: [id number] Date of Birth: 1951/03/05 Admit Type: Outpatient Age: 71 Room: [facility] ENDO ROOM 3 Gender: Male Note Status: Finalized Instrument Name: Park Meo [id number] Procedure:             Colonoscopy Indications:           Positive fecal immunochemical test Providers:             Andrey Farmer MD, MD Referring MD:          [facility], MD (Referring MD) Medicines:             Monitored Anesthesia Care Complications:         No immediate complications. Estimated blood loss:                         Minimal. Procedure:             Pre-Anesthesia Assessment:                        - Prior to the procedure, a History and Physical was                         performed, and patient medications and allergies were                         reviewed. The patient is competent. The risks and                         benefits of the procedure and the sedation options and                         risks were discussed with the patient. All questions                         were answered and informed consent was obtained.                         Patient identification and proposed procedure were                         verified by the physician, the nurse, the                         anesthesiologist, the anesthetist and the technician                         in the endoscopy suite. Mental Status Examination:                         alert and oriented. Airway Examination: normal                         oropharyngeal airway and neck mobility. Respiratory                         Examination: clear to auscultation. CV Examination:  normal. Prophylactic Antibiotics: The patient does not                         require prophylactic antibiotics. Prior                         Anticoagulants: The patient has taken no previous                          anticoagulant or antiplatelet agents. ASA Grade                         Assessment: III - A patient with severe systemic                         disease. After reviewing the risks and benefits, the                         patient was deemed in satisfactory condition to                         undergo the procedure. The anesthesia plan was to use                         monitored anesthesia care (MAC). Immediately prior to                         administration of medications, the patient was                         re-assessed for adequacy to receive sedatives. The                         heart rate, respiratory rate, oxygen saturations,                         blood pressure, adequacy of pulmonary ventilation, and                         response to care were monitored throughout the                         procedure. The physical status of the patient was                         re-assessed after the procedure.                        After obtaining informed consent, the colonoscope was                         passed under direct vision. Throughout the procedure,                         the patient's blood pressure, pulse, and oxygen                         saturations were monitored continuously. The  Colonoscope was introduced through the anus and                         advanced to the the terminal ileum. The colonoscopy                         was performed without difficulty. The patient                         tolerated the procedure well. The quality of the bowel                         preparation was good. Findings:      The perianal and digital rectal examinations were normal.      The terminal ileum appeared normal.      A 3 mm polyp was found in the ascending colon. The polyp was sessile.       The polyp was removed with a cold snare. Resection was complete, but the       polyp tissue was not retrieved. Estimated blood loss was minimal.      Two  sessile polyps were found in the transverse colon. The polyps were 2       to 3 mm in size. These polyps were removed with a cold snare. Resection       and retrieval were complete. Estimated blood loss was minimal.      Multiple small and large-mouthed diverticula were found in the sigmoid       colon, descending colon and ascending colon.      Internal hemorrhoids were found during retroflexion. The hemorrhoids       were Grade I (internal hemorrhoids that do not prolapse).      The exam was otherwise without abnormality on direct and retroflexion       views. Impression:            - The examined portion of the ileum was normal.                        - One 3 mm polyp in the ascending colon, removed with                         a cold snare. Complete resection. Polyp tissue not                         retrieved.                        - Two 2 to 3 mm polyps in the transverse colon,                         removed with a cold snare. Resected and retrieved.                        - Diverticulosis in the sigmoid colon, in the                         descending colon and in the ascending colon.                        -  Internal hemorrhoids.                        - The examination was otherwise normal on direct and                         retroflexion views. Recommendation:        - Discharge patient to home.                        - Resume previous diet.                        - Continue present medications.                        - Await pathology results.                        - Repeat colonoscopy in 3 - 5 years for surveillance.                        - Return to referring physician as previously                         scheduled. Procedure Code(s):     --- Professional ---                        [phone], Colonoscopy, flexible; with removal of                         tumor(s), polyp(s), or other lesion(s) by snare                         technique Diagnosis Code(s):     ---  Professional ---                        K64.0, First degree hemorrhoids                        K63.5, Polyp of colon                        R19.5, Other fecal abnormalities                        K57.30, Diverticulosis of large intestine without                         perforation or abscess without bleeding CPT copyright 2019 [employer]. All rights reserved. The codes documented in this report are preliminary and upon coder review may  be revised to meet current compliance requirements. Andrey Farmer MD, MD 12/13/2021 11:31:11 AM Number of Addenda: 0 Note Initiated On: 12/13/2021 10:54 AM Scope Withdrawal Time: 0 hours 15 minutes 38 seconds  Total Procedure Duration: 0 hours 19 minutes 23 seconds  Estimated Blood Loss:  Estimated blood loss was minimal.      [facility]

## 2021-12-14 ENCOUNTER — Encounter: Payer: Self-pay | Admitting: Gastroenterology

## 2021-12-14 ENCOUNTER — Encounter: Payer: No Typology Code available for payment source | Admitting: Physician Assistant

## 2021-12-14 DIAGNOSIS — E11621 Type 2 diabetes mellitus with foot ulcer: Secondary | ICD-10-CM | POA: Diagnosis not present

## 2021-12-14 LAB — SURGICAL PATHOLOGY

## 2021-12-14 NOTE — Progress Notes (Addendum)
[name], [name] ([id number]) Visit Report for 12/14/2021 Chief Complaint Document Details Patient Name: [name] Date of Service: 12/14/2021 11:00 AM Medical Record Number: [id number] Patient Account Number: [id number] Date of Birth/Sex: Mar 04, 1951 (71 y.o. M) Treating RN: [name] Primary Care Provider: Fontaine No Other Clinician: Referring Provider: Fontaine No Treating Provider/Extender: Skipper Cliche in Treatment: 59 Information Obtained from: Patient Chief Complaint Left LE ulcers Electronic Signature(s) Signed: 12/14/2021 11:19:09 AM By: Worthy Keeler PA-C Entered By: Worthy Keeler on 12/14/2021 11:19:09 [name] ([id number]) -------------------------------------------------------------------------------- HPI Details Patient Name: [name] Date of Service: 12/14/2021 11:00 AM Medical Record Number: [id number] Patient Account Number: [id number] Date of Birth/Sex: Apr 13, 1951 (71 y.o. M) Treating RN: [name] Primary Care Provider: Fontaine No Other Clinician: Referring Provider: Fontaine No Treating Provider/Extender: Skipper Cliche in Treatment: 29 History of Present Illness HPI Description: 71 year old male who presented to the ER with bilateral lower extremity blisters which had started last week. he has a past medical history of leukemia, diabetes mellitus, hypertension, edema of both lower extremities, his recurrent skin infections, peripheral vascular disease, coronary artery disease, congestive heart failure and peripheral neuropathy. in the ER he was given Rocephin and put on Silvadene cream. he was put on oral doxycycline and was asked to follow-up with the [facility]. His last hemoglobin A1c was 6.6 in December and he checks his blood sugar once a week. He does not have any physicians outside the [place] system. He does not recall any vascular duplex studies done either for arterial or venous disease but was told to  wear compression stockings which he does not use 05/30/2016 -- we have not yet received any of his notes from the [facility] hospital system and his arterial and venous duplex studies are scheduled here in [place] around mid February. We are unable to have his insurance accepted by home health agencies and hence he is getting dressings only once a week. 06/06/16 -- -- I received a call from the patient's PCP at the [facility] at [facility] and spoke to Dr. Garvin Fila, phone number [phone] and fax number [phone]. She confirmed that no vascular testing was done over the last 5 years and she would be happy to do them if the patient did want them to be done at the [place] and we could fax him a request. Readmission: 71 year old male seen by as in February of this year and was referred to vein and vascular for studies and opinion from the vascular surgeons. The patient returns today with a fresh problem having had blisters on his left lower extremity which have been there for about 5 days and he clearly states that he has been wearing his compression stockings as advised though he could not read the moderate compression and has been wearing light compression. Review of his electronic medical records note that he had lower extremity arterial duplex examination done on 06/23/2016 which showed no hemodynamically significant stenosis in the bilateral lower extremity arterial system. He also had a lower extremity venous reflux examination done on 07/07/2016 and it was noted that he had venous incompetence in the right great saphenous vein and bilateral common femoral veins. Patient was seen by Dr. Tamala Julian on the same day and for some reason his notes do not reflect the venous studies or the arterial studies and he recommended patient do a venous duplex ultrasound to look for reflux and return to see him.he would also consider a lymph pump if required. The patient was told that his workup  was normal and hence the  patient canceled his follow-up appointment. 02/03/17 on evaluation today patient left medial lower extremity blister appears to be doing about the same. It is still continuing to drain and there's still the blistered skin covering the wound bed which is making it difficult for the alternate to do its job. Fortunately there is no evidence of cellulitis. No fevers chills noted. Patient states in general he is not having any significant discomfort. Patient's lower extremity arterial duplex exam revealed that patient was hemodynamically stable with no evidence of stenosis in regard to the bilateral lower extremities. The lower extremity venous reflux exam revealed the patient had venous incontinence noted in the right greater saphenous and bilateral common femoral vein. There is no evidence of deep or superficial vein thrombosis in the bilateral lower extremities. Readmission: 11/12/18 Patient presents for evaluation our clinic today concerning issues that he is having with his left lower extremity. He tells me that a couple weeks ago he began developing blisters on the left lower extremity along with increased swelling. He typically wears his compression stockings on a regular basis is previously been evaluated both here as well is with vascular surgery they would recommend lymphedema pumps but unfortunately that somehow fell through and he never heard anything back from that. Nonetheless I think lymphedema pumps would be beneficial for this patient. He does have a history of hypertension and diabetes. Obviously the chronic venous stasis and lymphedema as well. At this point the blisters have been given in more trouble he states sometimes when the blisters openings able to clean it down with alcohol and it will dry out and do well. Unfortunately that has not been the case this time. He is having some discomfort although this mean these with cleaning the areas he doesn't have discomfort just on a regular  basis. He has not been able to wear his compression stockings since the blisters arose due to the fact that of course it will drain into the socks causing additional issues and he didn't have any way to wrap this otherwise. He has increased to taking his Lasix every day instead of every other day. He sees his primary care provider later this month as well. No fevers, chills, nausea, or vomiting noted at this time. 11/19/18-Patient returns at 1 week, per intake RN the amount of seepage into the compression wraps was definitely improved, overall all the wounds are measuring smaller but continuing silver alginate to the wounds as primary dressing 11/26/18 on evaluation today patient appears to be doing quite well in regard to his left lower [place] ulcers. In fact of the areas that were noted initially he only has two regions still open. There is no evidence of active infection at this time. He still is not heard anything from the company regarding lymphedema pumps as of yet. Again as previously seen vascular they have not recommended any surgical intervention. 12/03/2018 on evaluation today patient actually appears to be doing quite well with regard to his lower extremity ulcers. In fact most of the areas appear to be healed the one spot which does not seem to be completely healed I am unsure of whether or not this is really draining that much but nonetheless there does not appear to be any signs of infection or significant drainage at this point. There is no sign of fever, chills, nausea, vomiting, or diarrhea. Overall I am pleased with how things have progressed I think is very close to being able to transition  to his home compression stockings. [name], [name] ([id number]) 12/10/2018 upon evaluation today patient appears to be doing quite well with regard to his left lower extremity. He has been tolerating the dressing changes without complication. Fortunately there is no signs of active infection at this  time. He appears after thorough evaluation of his leg to only have 1 small area that remains open at this point everything else appears to be almost completely closed. He still have significant swelling of the left lower extremity. We had discussed discussing this with his primary care provider he is not able to see her in person they were at the [facility] and right now the [place] is not seeing patients on site. According to the patient anyway. Subsequently he did speak with her apparently and his primary care provider feels that he may likely have a DVT. With that being said she has not seen his leg she is just going off of his history. Nonetheless that is a concern that the patient now has as well and while I do not feel the DVT is likely we can definitely ensure that that is not the case I will go ahead and see about putting that order in today. Nonetheless otherwise I am in a recommend that we continue with the current wound care measures including the compression therapy most likely. We just need to ensure that his leg is indeed free of any DVTs. 12/17/2018 on evaluation today patient actually appears to be completely healed today. He does have 2 very small areas of blistering although this is not anything too significant at this point which is good news. With that being said I am in agreement with the fact that I think he is completely healed at this point. He does want to get back into his compression stocking. The good news is we have gotten approval from insurance for his lymphedema pumps we received a letter since last saw him last week. The other good news is his study did come back and showed no evidence of a DVT. 12/20/2018 on evaluation today patient presents for follow-up concerning his ongoing issues with his left lower extremity. He was actually discharged last Friday and did fairly well until he states blisters opened this morning. He tells me he has been wearing his compression  stocking although he has a hard time getting this on. There does not appear to be any signs of active infection at this time. No fevers, chills, nausea, vomiting, or diarrhea. 12/27/2018 on evaluation today patient appears to be doing very well with regard to his swelling of the left lower extremity the 4 layer compression wrap seems to have been beneficial for him. Fortunately there is no signs of active infection at this time. Patient has been tolerating the compression wrap without complication and his foot swelling in particular appears to be greatly improved. He does still have a wound on the lateral portion of his left leg I believe this is more of a blister that has now reopened. 01/03/2019 on evaluation today patient actually appears to be doing excellent in regard to his left lower extremity. He did receive his compression pumps and is actually use this 7 times since he was last here in the office. On top of the compression wrap he is now roughly 3 cm better at the calf and 2 cm better at the ankle he also states that his foot seem to go an issue better without even having to use a shoe horn. Obviously I  think this is all evidence that he is doing excellent in this regard. The other good news is he does not appear to have anything open today as far as wounds are concerned. 01/15/2019 on evaluation today patient appears to be doing more poorly yet again with regard to his left lower extremity. He has developed new wounds again after being discharged just recently. Unfortunately this continues to be the case that he will heal and then have subsequent new wounds. The last time I was hopeful that he may not end up coming back too quickly especially since he states he has been using his lymphedema pumps along with wearing his compression. Nonetheless he had a blister on the back of his leg that popped up on the left and this has opened up into an ulceration it is quite painful. 01/22/19 on evaluation  today patient actually appears to be doing well with regard to his wound on the left lower extremity. He's been tolerating the dressing changes without complication including the compression wrap in the wound appears to be significantly smaller today which is great news. Overall very pleased in this regard. 01/29/2019 on evaluation today patient appears to be doing well with regard to his left posterior lower extremity ulcer. He has been tolerating the dressing changes without complication. This is not completely healed but is getting much closer. We did order a Farrow wrap 4000 for him he has received this and has it with him today although I am not sure we are quite ready to start him on that as of yet. We are very close. 02/05/2019 on evaluation today patient actually appears to be doing quite well with regard to his left posterior lower extremity ulcer. He still has a very tiny opening remaining but the fortunate thing is he seems to be healing quite nicely. He also did get his Farrow wrap which I am hoping will help with his edema control as well at home. Fortunately there is no evidence of active infection. 02/12/2019 patient and fortunately appears to be doing poorly in regard to his wounds of the left lower extremity. He was very close to healing therefore we attempted to use his Velcro compression wraps continuing with lymphedema pumps at home. Unfortunately that does not seem to have done very well for him. He tells me that he wore them all the time but again I am not sure why if that is the case that he is having such significant edema. He is still on his fluid pills as well. With that being said there is no obvious sign of infection although I do wonder about the possibility of infection at this time as well. 02/19/2019 unfortunately upon evaluation today patient appears to be doing more poorly with regard to his left lower extremity. He is not showing signs of significant improvement and I  think the biggest issue here is that he does have an infection that appears to likely be Pseudomonas. That is based on the blue-green drainage that were noted at this time. Unfortunately the antibiotic that has been on is not going to take care of this at all. I think they will get a need to switch him to either Levaquin or Cipro and this was discussed with the patient. 02/26/2019 on evaluation today patient's lower extremity on the left appears to be doing significantly better as compared to last evaluation. Fortunately there is no signs of active infection at this time. He has been tolerating the compression wrap without complication in  fact he made it the whole week at this point. He is showing signs of excellent improvement I am very happy in this regard. With that being said he is having some issues with infection we did review the results of his culture which I noted today. He did have a positive finding for Enterobacter as well as Alcaligenes faecalis. Fortunately the Levaquin that I placed him on will work for both which is great news. There is no signs of systemic infection at this point. 10/30; left posterior leg wound in the setting of very significant edema and what looks like chronic venous inflammation. He has compression pumps but does not use them. We have been using 3 layer compression. Silver alginate to the wound as the primary dressing 03/18/2019 on evaluation today patient appears to be doing a little better compared to last time I saw him. He really has not been using his compression pumps he tells me that he is having too much discomfort. He has been keeping his wraps on however. He is only been taking his fluid pills every other day because he states they are not really helping and he has an appointment with his primary care provider at the [facility] tomorrow. Subsequently the wound itself on the left lower extremity does seem to be greatly improved compared to previous. 03/25/2019 on  evaluation today patient appears to be doing better with regard to his wounds on the bilateral lower extremities. The left is doing excellent the right is also doing better although both still do show some signs of open wounds noted at this point unfortunately. Fortunately there is no signs of active infection at this time. The patient also is not really having any significant pain which is good news. Unfortunately there was some confusion with the referral on vascular disease and as far as getting the patient scheduled there can be contacting him later today to do this [name], [name] ([id number]) fortunately we got this straightened out. 04/01/2019 on evaluation today patient appears to be doing no fevers, chills, nausea, vomiting, or diarrhea. Excellent at this time with regard to his lower extremities. There does not appear to be any open wound at this point which is good news. Fortunately is also no signs of active infection at this time. Overall feel like the patient has done excellent with the compression the problem is every time we got him to this point and then subsequently go to using his own compression things just go right back to where they were. I am not sure how to address this we can try to get an appointment with vascular for 2 weeks now they have yet to call him. Obviously this has become frustrating for the patient as well. I think the issue has just been an honest error as far as scheduling is concerned but nonetheless still worn out the point where I am unsure of which direction we should take. 04/08/2019 on evaluation today patient actually appears to be doing well with regard to his lower extremities. There are no open wounds at this time and things seem to be managing quite nicely as far as the overall edema control is concerned. With that being said he does have his compression socks today for [place] to go ahead and reinitiate therapy in that manner at this point. He is going to be  going for shoes to be measured on Wednesday and then coincidentally he will also be seeing vascular on Thursday. Overall I think this is good news and  again I am hopeful that they will be able to do something for him to help prevent ongoing issues with edema control as well. No fevers, chills, nausea, vomiting, or diarrhea. 04/11/2019 on evaluation today patient actually appears to be doing poorly after just being discharged on Monday of this week. He had been experiencing issues with again blisters especially on the left lower extremity. With that being said he was completely healed and appeared to be doing great this past Monday. He then subsequently has new blisters that formed before his appointment with vascular this morning. He was also measured for shoes in the interim. With that being said we may have figured out what exactly is going on and why he continues to have issues like what we are seeing at this point. He takes his compression stockings off at nighttime and then he ends up having to sleep in his chair for 5-6 hours a night. He sleeps with his feet down he cannot really get him up in the recliner and therefore he is sleeping and the worst possible his position with his feet on the floor for that majority of the time. Again as I explained to him that is about one third at minimum at least one fourth of his day that he spending with his feet dangling down on the ground and the worst possible position they could be. I think this may be what is causing the issue. Subsequently I am leaning toward thinking that he may need a hospital bed in order to elevate his legs. We likely can have to coordinate this with his primary care provider at the [facility]. Readmission: 01/26/2021 this is a patient who presents for repeat evaluation here in the clinic although it is actually been couple of years since have seen him in fact it was December 2020 when I last saw him. Subsequently he never really  healed but did end up being lost to follow-up. He tells me has been having issues ongoing with his lower extremities has bilateral lower extremity lymphedema no real significant or definitive open wounds but in general his lymphedema is way out of control. We were never able to refer him to lymphedema clinic simply due to the fact to be honest we were never able to get him completely healed. I do not see anyone with open wounds. The patient does have evidence of type 2 diabetes mellitus, lymphedema, chronic venous insufficiency, and hypertension. That really has not changed since his last evaluation. 02/09/2021 upon evaluation today patient appears to be doing a little better in regard to his legs although he still having a tremendous amount of drainage especially on the left leg. Fortunately there does not appear to be any evidence of active infection. Of note when we looked into this further it appears that the patient did not have any absorptive dressing on it was just the 4-layer compression wrap. Nonetheless this is probably big part of the issue here. 10/10; he comes in today with 3 large areas on the upper right lower leg likely remanence of denuded blistering under his compression wraps. He has no other wounds on the right. On the left he has the denuded area on the left medial foot and ankle and on the left dorsal foot. Massive lymphedema in both feet dorsally. Using Zetuvit under compression We have increased home health visitation to twice a week to change the dressings and will change it once 02/22/2021 upon evaluation today patient appears to be doing well currently  with regard to his wounds. He has been tolerating the dressing changes without complication. Fortunately there does not appear to be any evidence of active infection which is great news. No fevers, chills, nausea, vomiting, or diarrhea. The biggest issue I see currently is that home health is not putting any medicine on the  actual wounds before wrapping. 03/01/2021 upon evaluation today the patient's right leg actually appears to be doing quite well which is great news there does not appear to be any evidence of active infection at this time. No fevers, chills, nausea, vomiting, or diarrhea. With that being said the patient is having issues on the left foot where he is having significant drainage is also an ammonia smell he does not have any animals at home and this makes me concerned about a bacteria producing urea as a byproduct. Again the possible common organisms will be E. coli, Proteus, and Enterococcus. All 3 of which can be successfully treated with Levaquin. For that reason I think that this may be a good option for [place] to consider placing him on and I did obtain a culture as well for confirmation sake. 03/08/2021 upon evaluation today patient appears to be doing unfortunately still somewhat poorly in regard to his leg ulcerations. He actually has an area on the right leg where he blistered due to the fact that his wrap slid down and caused an area of pinching on his skin and this has led to a significant issue here. 03/15/2021 upon evaluation today patient unfortunately has not been wrapped appropriately with absorptive dressings nor with the appropriate technique for the third layer of the 4-layer compression wrap. These are issues that we continue to try to address with the home health nurse. Also the absorptive dressing that she had was cut in half and therefore that causes things to leak out it does not actually trap the fluid in regard to the top of the foot overall I think that all these combined are really not seeing things improved significantly here. Fortunately there does not appear to be any signs of significant infection at this time which is good news. He still is having a tremendous amount of drainage. 03/22/2021 upon evaluation today patient appears to be draining tremendously. He still continues  to tell me that he is using his pumps 2 times a day and that coupled with that tells me that he is elevating his legs as well. With that being said all things considered I am really just not seeing the improvement we would expect to see with the 4-layer compression wrap and all the above noted. He in fact had an extremely large Zetuvit dressing on both legs and that they were extremely filled to the max with fluid. This is after just being changed just before the weekend and this is Monday. Nonetheless I am concerned about the fact that there is something going on fairly significant that we cannot get any of this under control and that he is draining this significantly. He supposed be having an echocardiogram it sounds like scheduling has been an issue for him as far as getting in sooner. Its something to do with needing his cousin to drive him because of where it sat and he cannot drive himself to this appointment either way I really think he needs to try to see what he can do about making this happen a little sooner. He tells me he will call today. [name], [name] ([id number]) 03/29/2021 on evaluation today patient appears to be  doing about the same in regard to his legs. He did get his cardiology appointment moved up to 6 December which is at least good that is better than what it was before mid December. Overall very pleased in that regard. 04/05/2021 upon evaluation today patient unfortunately is still doing fairly poorly. There does not appear to be any signs of active infection at this time. No fevers, chills, nausea, vomiting, or diarrhea. Unfortunately I think until his edema is under control and overall fluid overload there is really not to be much chance that I can do much to get him better. This is quite unfortunate and frustrating both for myself and the patient to be perfectly honest. Nonetheless I think that he really needs to have a conversation both with his primary care provider as well  as cardiologist he sees the PCP on Monday and cardiology on Wednesday of next week. 04/13/2021 upon evaluation today patient appears to be doing poorly in regard to his bilateral lower extremities his left is still worse than the right. With that being said he has a tremendous amount of drainage he did see his primary care provider yesterday there really was not much there to be done from their perspective. He sees cardiology tomorrow. Nonetheless my biggest concern here is simply that if we do not get the edema under control he is going to continue to have drainage and honestly I think at some point he is going to become infected severely that is my main concern. 04/19/2021 upon evaluation today patient appears to be doing poorly still in regard to his legs. Unfortunately there does not appear to be any signs of infection at this point. He does have a tremendous amount of drainage however. We have not seen the results back from the cardiologist and the echocardiogram that was done. It appears that the patient checked out okay as far as that is concerned with regard to ejection fraction though we still have some issues here to be honest with his diastolic function. I am unsure if this is accounting for everything that we are seeing or not. Either way he has a tremendous amount of drainage from his legs that we are just not able to control in the outpatient setting at this point. I have reached out to Dr. Rockey Situ his cardiologist to see once he reviews the sheet if there is anything that he feels like can be done from an outpatient perspective if not then I think the way to go is probably can to be through inpatient admission and diuresis. Otherwise I am not sure how working to get this under control we tried antibiotics, compression wrapping, and I have told the patient to be elevating his legs I am not sure how much he does of this but either way I think that this is still an ongoing issue  nonetheless. 04/26/2021 upon evaluation today patient appears to be doing poorly in regard to his legs. He is having a tremendous amount of fluid at this point which is quite unfortunate. Its to the point that he may have had at least 5 to 10 pounds of fluid in his dressings this morning when they were removed these were changed this Friday. Subsequently I think he needs to go to the ER for further evaluation and treatment I think is probably can need diuresis possibly even IV antibiotics been on what the blood work looks like but in general I feel like he needs something to get this under control from  an outpatient perspective absent of everywhere I can think of and I cannot get this under control with our traditional measures. I think this is going require more so that we can get him better 12/30; this is a patient with severe bilateral lymphedema. He was hospitalized from 04/26/2021 through 04/29/2021 treated for cellulitis in the setting of lower extremity ulcers and lymphedema. After he left the hospital he is apparently seen for nurse visit our staff contacted cardiology and he has been started on Lasix 40 mg. Apparently his legs have less edema. Lab work from 05/04/2021 showed a BUN of 38 and creatinine of 1.59 these are elevated versus previous where his creatinine seems to have been 1.30 on 12/19 his potassium is 4.3. I believe the lab work is being followed by cardiology We have him in a 4-layer wrap. Xeroform on the leg wounds and sit to fit on the Berry damage skin on the left dorsal foot versus right dorsal foot. He has compression pumps but does not use them. We have apparently not yet ordered him compression stockings 05/17/2021 upon evaluation today patient's legs though better than last time I personally saw him appear to be getting worse compared to where they were previous. Dr. Quentin Cornwall was actually last 1 to see you I have not seen him since 19 December. That was before he went into  the ER. Coming out apparently his legs looked also and they still look better but not as good as they were in the past. 1/16; patient with severe bilateral lymphedema. Severe scaled hyperkeratotic skin on the dorsal aspect of his distal left foot and left medial ankle.. On the right side changes are not as bad. He did not have any weeping edema. Our intake nurse was convinced that he is being compliant with compression pumps 1 hour twice a day 05/31/2021 upon evaluation today patient actually appears to be doing a little bit better in my opinion in regard to his feet. I do not see as much drainage and it being just completely wet as it was previous. Fortunately I do not also see any signs of active infection which is great news as well. 06/07/21 Upon inspection patient's wound bed actually showed signs of doing well he is not nearly as weepy and wet as he has been in the past and overall very pleased in that regard. Fortunately I do not see any signs of active infection locally or nor systemically at this time. Which is great news. No fevers, chills, nausea, vomiting, or diarrhea. 06/14/2021 upon evaluation today patient appears to be doing well with regard to his right foot I am pleased in that regard. His left foot is still draining quite a bit despite using lymphedema pumps, 4-layer compression wraps, and he tells me elevating his legs as well. He also has Lasix that he takes twice a day. Nonetheless I believe that this is still good to be an ongoing issue. We have a hard time getting this under control as far as the swelling is concerned. 06/21/2021 upon evaluation today patient appears to be doing decently well in regard to his wounds all things considered. He still has a tremendous amount of drainage and fluid noted at this point. Fortunately I do not see any signs of active infection locally or systemically at this point which is great news. Nonetheless I am unsure where to go and how to do this  as far as trying to limit his swelling and weeping from his toes in particular.  06/28/2021 upon evaluation patient unfortunately continues to have significant drainage from his feet. We have been keeping him in a compression wrap and despite this he still continues to have extreme fluid issues he seen his cardiologist he is seeing the nephrologist. We really cannot find any way to get this under control when he did well was when he was in the hospital and they got some of the fluid away. But outside of that we are just struggling to achieve the long-term goal of getting this under control and keeping it under control unfortunately. 07/05/2021 upon evaluation today patient appears to be doing poorly in regard to his feet. Unfortunately this continues to be a significant issue and to be honest I am really not certain what to do about it. I referred him to Dr. Randol Kern at [facility] and he does have an appointment although it is 12 April. He also sees his primary care provider on 6 April. He did not want to see Dr. Haynes Kerns until after he saw his PCP that is the reason the appointment so far out. There is really not much I can do in that regard. Nonetheless I do think that we are still continue to have significant lymphedema issues with significant mount of weeping in regard to the feet and again this has just become extremely difficult to manage to be honest I am not sure if there is something else that Dr. Haynes Kerns or someone else could recommend he also will be seeing Dr. Dellia Nims in 2 weeks when I am on vacation and at that time I will see if Dr. Dellia Nims has any ideas about where to go from here in the meantime. [name], [name] ([id number]) 07/12/2021 upon evaluation today patient appears to be doing about as well as can be expected with regard to his feet. He does actually see his kidney doctor this Friday. He also will be seeing his primary care provider on April 4 and then following that around mid April he will be  seeing Dr. Haynes Kerns at [facility] which was a referral made for him. Again my goal is to try to find out some way to fix this and to be perfectly honest we have had some issues with making any good adjustments. When he was in the hospital and greater amounts of Lasix he was able to get this down and it looked much better upon discharge. With that being said right now things just are not doing nearly as good as what they used to be. 3/13; patient presents for follow-up. He has been using his lymphedema pumps over the past week. He reports an increase in his Lasix dose. He has no issues or complaints today. He denies signs of infection. 07/26/2021 upon evaluation today patient appears to be doing better in regard to his feet bilaterally. Both are showing signs of much less drainage which is great news and overall very pleased in that regard. Fortunately there does not appear to be any evidence of active infection locally or systemically at this time. No fevers, chills, nausea, vomiting, or diarrhea. 08/02/2021 upon evaluation today patient appears to be doing well with regard to his feet. Both are showing signs of being drier the right pretty much has not really draining much at all which is great news. The left is not draining anywhere close to his much as it was during the last evaluation. This is excellent news and overall very pleased. 08/09/2021 upon evaluation today patient appears to be doing well with regard to  his legs the right leg especially showing signs of excellent improvement which is great news I do not see any evidence of active infection locally or systemically which is great. In regard to the left leg he still has some weeping and drainage but nothing as significant as what it was in the past this is great news. 08-16-2021 upon evaluation today patient appears to actually be doing quite well in my opinion in regard to his feet. This is significantly improved compared to what we previously seen  and overall I am extremely pleased in that regard. I do believe that He is actually improving although this is obviously very slow going. 08-23-2021 upon evaluation today patient appears to be doing well currently in regard to his right leg which actually is pretty dry at this point today. Fortunately I do not see any evidence of active infection at this time which is great news. No fevers, chills, nausea, vomiting, or diarrhea. 08-30-2021 upon evaluation today patient appears to be doing well with regard to his lower extremities. The right foot is pretty much completely dry which is great news the left foot though not completely dry seems to be doing decently well. I do not see any signs of active infection locally or systemically which is great news. No fevers, chills, nausea, vomiting, or diarrhea. 09-06-2021 upon evaluation today patient appears to be doing well with regard to his feet in fact now the left foot is almost completely dry as well and I am definitely seeing a lot of significant improvement. With that being said unfortunately he actually appears to have some cellulitis of his right thigh. His toes are also little bit red but this may just be due to the increased swelling. He really is not warm to touch in regard to the toes. 5/8; excellent edema control on the right foot and lower leg there is no open wounds but we continue to put compression on this otherwise this will breakdown. He is using his compression pumps twice a day at home The area that is problematic is on the left dorsal foot some areas that are not fully epithelialized with very dry fissured skin over this area. 09-20-2021 upon evaluation today patient appears to be doing a little bit worse in regard to swelling at this time. Fortunately I do not see any signs of infection with that being said the wrap was not on quite as well as what I would like to have seen. I do believe that this has caused a little bit of excess swelling  and again we need to try to get this under good control. 09-27-2021 upon evaluation today patient appears to be doing awesome in regard to his feet and legs. Everything is measuring smaller the swelling is down and to be perfectly honest I am extremely pleased with the end of his feet especially on the left side and how dry this is today. I do think we are on the right track here. 5/31; patient presents for follow-up. He is using nystatin powder to the feet bilaterally under 4-layer compression. He has no issues or complaints today. He states he is going to the lymphedema clinic tomorrow. Since he has no open wounds on the right lower extremity they will be focused on the side. 10-11-2021 upon evaluation today patient appears to be doing excellent in regard to his legs. He has been tolerating the dressing changes without complication. Fortunately there does not appear to be any evidence of active infection locally or  systemically at this time which is great news. No fevers, chills, nausea, vomiting, or diarrhea. 10-18-2021 upon evaluation today patient appears to be doing well with regard to his legs. He has been tolerating the dressing changes without complication and actually seen in lymphedema clinic now in regard to his right leg were taken care of the left leg. Fortunately I do not see any evidence of active infection locally or systemically at this time. 10-26-2021 upon evaluation today patient's wounds actually are showing signs of doing well in fact he really does not have wounds as much is weeping of the lower extremities. Fortunately I do not see any evidence of active infection locally or systemically which is great news. No fever or chills noted 11-02-2021 upon evaluation today patient appears to be doing decently well in regard to his legs. Of actually been on the phone quite a bit discussing with [facility] orthotics custom compression for him. I also have been discussing everything with his  lymphedema clinic provider as well. Helene Kelp notes that there is really not much more that she can do for him which was noted last week as well. Subsequently I do believe that the patient would benefit from getting custom compression in fact I think that is what he is going require to keep anything under control here. He voiced understanding. 11-08-2021 upon evaluation today patient appears to be doing excellent in regard to his legs and feet. Fortunately I do not see any signs of infection at this time which is great news. No fever or chills noted again I am still been working on trying to figure out what exactly we need to do as far as getting the [place] involved in coverage for custom compression for this patient. I also discussed with him that he does need to have custom shoes which I completely understand. With that being said it does make it difficult thinking about what kind of combination of shoes and compression is going to be best for him for the long-term. Fortunately I do not see any evidence of active infection locally or systemically which is great news. Overall I think you are doing quite well. 11-16-2021 upon evaluation today patient appears to be doing well currently in regard to his legs and feet. Everything is significantly smaller [place], [name] ([id number]) compared to what has been. Fortunately I do not see any evidence of active infection locally or systemically at this time which is great news. 11-23-2021 upon evaluation today patient appears to be doing well with regard to his legs bilaterally. We can actually initiate treatment with a recommendation today to utilize Tubigrip and try to see if we can get this under better control. The patient is in agreement with the plan. Nonetheless the big question is whether this will be something that he can utilize at home in order to keep his swelling down and allow him to be able to function without having to come into the clinic as frequently.  He notes that he would love to get to this point. He does have a cousin who is willing to help him with change out the Tubigrip when necessary. 7/25; the patient does not have any open wounds however his left leg is a lot more swollen than it was last week. His cousin came in who presumably would be the one to change his juxta lites to practice on doing this for the patient. She apparently lives in the [place] block. In the meantime he has been using Tubigrip 12-07-2021 upon  evaluation today patient appears to be doing well with regard to his legs although we are transitioning to try to get him to use the Tubigrip which again has been a little bit of a setback but not too much overall I think he is still doing well he still able to wear his current shoes which is good news. He does have a cousin who is willing to help him with the Tubigrip and we are training her how to do this as well. She is here today. 12-14-2021 upon evaluation today patient appears to be doing well currently in regard to his right leg which I think is doing excellent with the Tubigrip. With regard to the left leg unfortunately this is not doing nearly as well and will get me to do something to intervene here. I think getting back into a 4-layer wraps can be the way to go short-term To get this back under control and the patient voiced understanding. Electronic Signature(s) Signed: 12/14/2021 11:31:49 AM By: Worthy Keeler PA-C Previous Signature: 12/14/2021 11:31:29 AM Version By: Worthy Keeler PA-C Entered By: Worthy Keeler on 12/14/2021 11:31:49 [name] ([id number]) -------------------------------------------------------------------------------- Physical Exam Details Patient Name: [name] Date of Service: 12/14/2021 11:00 AM Medical Record Number: [id number] Patient Account Number: [id number] Date of Birth/Sex: 1951-01-09 (71 y.o. M) Treating RN: [name] Primary Care Provider: Fontaine No Other  Clinician: Referring Provider: Fontaine No Treating Provider/Extender: Skipper Cliche in Treatment: 45 Constitutional Well-nourished and well-hydrated in no acute distress. Respiratory normal breathing without difficulty. Psychiatric this patient is able to make decisions and demonstrates good insight into disease process. Alert and Oriented x 3. pleasant and cooperative. Notes Upon inspection patient's wound bed actually showed signs of doing quite well there was some slough and biofilm noted on the surface of the wound I did perform some debridement just with saline gauze to clear this away no sharp debridement was necessary this is on the left anterior portion of the ankle. Postdebridement this seems to be doing much better. Electronic Signature(s) Signed: 12/14/2021 11:32:07 AM By: Worthy Keeler PA-C Entered By: Worthy Keeler on 12/14/2021 11:32:07 [name] ([id number]) -------------------------------------------------------------------------------- Physician Orders Details Patient Name: [name] Date of Service: 12/14/2021 11:00 AM Medical Record Number: [id number] Patient Account Number: [id number] Date of Birth/Sex: 02/06/1951 (71 y.o. M) Treating RN: [name] Primary Care Provider: Fontaine No Other Clinician: Referring Provider: Fontaine No Treating Provider/Extender: Skipper Cliche in Treatment: 44 Verbal / Phone Orders: No Diagnosis Coding ICD-10 Coding Code Description E11.622 Type 2 diabetes mellitus with other skin ulcer I89.0 Lymphedema, not elsewhere classified I87.2 Venous insufficiency (chronic) (peripheral) L97.822 Non-pressure chronic ulcer of other part of left lower leg with fat layer exposed L97.512 Non-pressure chronic ulcer of other part of right foot with fat layer exposed I10 Essential (primary) hypertension L97.811 Non-pressure chronic ulcer of other part of right lower leg limited to breakdown of  skin Follow-up Appointments o Return Appointment in 1 week. o Nurse Visit as needed Bathing/ Shower/ Hygiene o May shower with wound dressing protected with water repellent cover or cast protector. o No tub bath. Anesthetic (Use 'Patient Medications' Section for Anesthetic Order Entry) o Lidocaine applied to wound bed Edema Control - Lymphedema / Segmental Compressive Device / Other Bilateral Lower Extremities o 4 Layer Compression System Lymphedema. - left side with nystatin between toes and silver cell to any weeping areas with tubi c double layer to ankle/foot o Tubigrip double layer applied - Nystatin powder  between toes double size d bilat just behind toes to patella knotch , double size C foot ankles enclose toes to just above ankle scell to any weeping areas o Elevate, Exercise Daily and Avoid Standing for Long Periods of Time. o Elevate legs to the level of the heart and pump ankles as often as possible o Elevate leg(s) parallel to the floor when sitting. o Compression Pump: Use compression pump on left lower extremity for 60 minutes, twice daily. - 2 times per day o DO YOUR BEST to sleep in the bed at night. DO NOT sleep in your recliner. Long hours of sitting in a recliner leads to swelling of the legs and/or potential wounds on your backside. o Other: - Contact prescriber regarding use of diuretics to reduce fluid overload. Off-Loading o Turn and reposition every 2 hours Additional Orders / Instructions o Follow Nutritious Diet and Increase Protein Intake Medications-Please add to medication list. o P.O. Antibiotics Electronic Signature(s) Signed: 12/20/2021 10:58:33 AM By: [name] RN Signed: 12/20/2021 4:45:05 PM By: Worthy Keeler PA-C Previous Signature: 12/14/2021 12:44:12 PM Version By: [name] RN Previous Signature: 12/14/2021 3:58:52 PM Version By: Worthy Keeler PA-C Entered By: [name] on 12/20/2021 10:54:00 [name] ([id number]) -------------------------------------------------------------------------------- Problem List Details Patient Name: [name] Date of Service: 12/14/2021 11:00 AM Medical Record Number: [id number] Patient Account Number: [id number] Date of Birth/Sex: [birth year] (71 y.o. M) Treating RN: [name] Primary Care Provider: Fontaine No Other Clinician: Referring Provider: Fontaine No Treating Provider/Extender: Skipper Cliche in Treatment: 46 Active Problems ICD-10 Encounter Code Description Active Date MDM Diagnosis E11.622 Type 2 diabetes mellitus with other skin ulcer 01/26/2021 No Yes I89.0 Lymphedema, not elsewhere classified 01/26/2021 No Yes I87.2 Venous insufficiency (chronic) (peripheral) 01/26/2021 No Yes L97.822 Non-pressure chronic ulcer of other part of left lower leg with fat layer 01/26/2021 No Yes exposed L97.512 Non-pressure chronic ulcer of other part of right foot with fat layer 01/26/2021 No Yes exposed [place] (primary) hypertension 01/26/2021 No Yes L97.811 Non-pressure chronic ulcer of other part of right lower leg limited to 02/15/2021 No Yes breakdown of skin Inactive Problems Resolved Problems Electronic Signature(s) Signed: 12/14/2021 11:19:06 AM By: Worthy Keeler PA-C Entered By: Worthy Keeler on 12/14/2021 11:19:05 [name] ([id number]) -------------------------------------------------------------------------------- Progress Note Details Patient Name: [name] Date of Service: 12/14/2021 11:00 AM Medical Record Number: [id number] Patient Account Number: [id number] Date of Birth/Sex: 1950/08/08 (71 y.o. M) Treating RN: [name] Primary Care Provider: Fontaine No Other Clinician: Referring Provider: Fontaine No Treating Provider/Extender: Skipper Cliche in Treatment: 73 Subjective Chief Complaint Information obtained from Patient Left LE ulcers History of Present Illness  (HPI) 71 year old male who presented to the ER with bilateral lower extremity blisters which had started last week. he has a past medical history of leukemia, diabetes mellitus, hypertension, edema of both lower extremities, his recurrent skin infections, peripheral vascular disease, coronary artery disease, congestive heart failure and peripheral neuropathy. in the ER he was given Rocephin and put on Silvadene cream. he was put on oral doxycycline and was asked to follow-up with the [facility]. His last hemoglobin A1c was 6.6 in December and he checks his blood sugar once a week. He does not have any physicians outside the [place] system. He does not recall any vascular duplex studies done either for arterial or venous disease but was told to wear compression stockings which he does not use 05/30/2016 -- we have not yet received any of his notes  from the [facility] hospital system and his arterial and venous duplex studies are scheduled here in [place] around mid February. We are unable to have his insurance accepted by home health agencies and hence he is getting dressings only once a week. 06/06/16 -- -- I received a call from the patient's PCP at the [facility] and spoke to Dr. Garvin Fila, phone number [phone] and fax number [phone]. She confirmed that no vascular testing was done over the last 5 years and she would be happy to do them if the patient did want them to be done at the [place] and we could fax him a request. Readmission: 71 year old male seen by as in February of this year and was referred to vein and vascular for studies and opinion from the vascular surgeons. The patient returns today with a fresh problem having had blisters on his left lower extremity which have been there for about 5 days and he clearly states that he has been wearing his compression stockings as advised though he could not read the moderate compression and has been wearing light compression. Review of his  electronic medical records note that he had lower extremity arterial duplex examination done on 06/23/2016 which showed no hemodynamically significant stenosis in the bilateral lower extremity arterial system. He also had a lower extremity venous reflux examination done on 07/07/2016 and it was noted that he had venous incompetence in the right great saphenous vein and bilateral common femoral veins. Patient was seen by Dr. Tamala Julian on the same day and for some reason his notes do not reflect the venous studies or the arterial studies and he recommended patient do a venous duplex ultrasound to look for reflux and return to see him.he would also consider a lymph pump if required. The patient was told that his workup was normal and hence the patient canceled his follow-up appointment. 02/03/17 on evaluation today patient left medial lower extremity blister appears to be doing about the same. It is still continuing to drain and there's still the blistered skin covering the wound bed which is making it difficult for the alternate to do its job. Fortunately there is no evidence of cellulitis. No fevers chills noted. Patient states in general he is not having any significant discomfort. Patient's lower extremity arterial duplex exam revealed that patient was hemodynamically stable with no evidence of stenosis in regard to the bilateral lower extremities. The lower extremity venous reflux exam revealed the patient had venous incontinence noted in the right greater saphenous and bilateral common femoral vein. There is no evidence of deep or superficial vein thrombosis in the bilateral lower extremities. Readmission: 11/12/18 Patient presents for evaluation our clinic today concerning issues that he is having with his left lower extremity. He tells me that a couple weeks ago he began developing blisters on the left lower extremity along with increased swelling. He typically wears his compression stockings on a  regular basis is previously been evaluated both here as well is with vascular surgery they would recommend lymphedema pumps but unfortunately that somehow fell through and he never heard anything back from that. Nonetheless I think lymphedema pumps would be beneficial for this patient. He does have a history of hypertension and diabetes. Obviously the chronic venous stasis and lymphedema as well. At this point the blisters have been given in more trouble he states sometimes when the blisters openings able to clean it down with alcohol and it will dry out and do well. Unfortunately that has not  been the case this time. He is having some discomfort although this mean these with cleaning the areas he doesn't have discomfort just on a regular basis. He has not been able to wear his compression stockings since the blisters arose due to the fact that of course it will drain into the socks causing additional issues and he didn't have any way to wrap this otherwise. He has increased to taking his Lasix every day instead of every other day. He sees his primary care provider later this month as well. No fevers, chills, nausea, or vomiting noted at this time. 11/19/18-Patient returns at 1 week, per intake RN the amount of seepage into the compression wraps was definitely improved, overall all the wounds are measuring smaller but continuing silver alginate to the wounds as primary dressing 11/26/18 on evaluation today patient appears to be doing quite well in regard to his left lower [place] ulcers. In fact of the areas that were noted initially he only has two regions still open. There is no evidence of active infection at this time. He still is not heard anything from the company regarding lymphedema pumps as of yet. Again as previously seen vascular they have not recommended any surgical intervention. ARTHURO, CANELO ([id number]) 12/03/2018 on evaluation today patient actually appears to be doing quite well with  regard to his lower extremity ulcers. In fact most of the areas appear to be healed the one spot which does not seem to be completely healed I am unsure of whether or not this is really draining that much but nonetheless there does not appear to be any signs of infection or significant drainage at this point. There is no sign of fever, chills, nausea, vomiting, or diarrhea. Overall I am pleased with how things have progressed I think is very close to being able to transition to his home compression stockings. 12/10/2018 upon evaluation today patient appears to be doing quite well with regard to his left lower extremity. He has been tolerating the dressing changes without complication. Fortunately there is no signs of active infection at this time. He appears after thorough evaluation of his leg to only have 1 small area that remains open at this point everything else appears to be almost completely closed. He still have significant swelling of the left lower extremity. We had discussed discussing this with his primary care provider he is not able to see her in person they were at the [facility] and right now the [place] is not seeing patients on site. According to the patient anyway. Subsequently he did speak with her apparently and his primary care provider feels that he may likely have a DVT. With that being said she has not seen his leg she is just going off of his history. Nonetheless that is a concern that the patient now has as well and while I do not feel the DVT is likely we can definitely ensure that that is not the case I will go ahead and see about putting that order in today. Nonetheless otherwise I am in a recommend that we continue with the current wound care measures including the compression therapy most likely. We just need to ensure that his leg is indeed free of any DVTs. 12/17/2018 on evaluation today patient actually appears to be completely healed today. He does have 2 very small areas  of blistering although this is not anything too significant at this point which is good news. With that being said I am in agreement with  the fact that I think he is completely healed at this point. He does want to get back into his compression stocking. The good news is we have gotten approval from insurance for his lymphedema pumps we received a letter since last saw him last week. The other good news is his study did come back and showed no evidence of a DVT. 12/20/2018 on evaluation today patient presents for follow-up concerning his ongoing issues with his left lower extremity. He was actually discharged last Friday and did fairly well until he states blisters opened this morning. He tells me he has been wearing his compression stocking although he has a hard time getting this on. There does not appear to be any signs of active infection at this time. No fevers, chills, nausea, vomiting, or diarrhea. 12/27/2018 on evaluation today patient appears to be doing very well with regard to his swelling of the left lower extremity the 4 layer compression wrap seems to have been beneficial for him. Fortunately there is no signs of active infection at this time. Patient has been tolerating the compression wrap without complication and his foot swelling in particular appears to be greatly improved. He does still have a wound on the lateral portion of his left leg I believe this is more of a blister that has now reopened. 01/03/2019 on evaluation today patient actually appears to be doing excellent in regard to his left lower extremity. He did receive his compression pumps and is actually use this 7 times since he was last here in the office. On top of the compression wrap he is now roughly 3 cm better at the calf and 2 cm better at the ankle he also states that his foot seem to go an issue better without even having to use a shoe horn. Obviously I think this is all evidence that he is doing excellent in this  regard. The other good news is he does not appear to have anything open today as far as wounds are concerned. 01/15/2019 on evaluation today patient appears to be doing more poorly yet again with regard to his left lower extremity. He has developed new wounds again after being discharged just recently. Unfortunately this continues to be the case that he will heal and then have subsequent new wounds. The last time I was hopeful that he may not end up coming back too quickly especially since he states he has been using his lymphedema pumps along with wearing his compression. Nonetheless he had a blister on the back of his leg that popped up on the left and this has opened up into an ulceration it is quite painful. 01/22/19 on evaluation today patient actually appears to be doing well with regard to his wound on the left lower extremity. He's been tolerating the dressing changes without complication including the compression wrap in the wound appears to be significantly smaller today which is great news. Overall very pleased in this regard. 01/29/2019 on evaluation today patient appears to be doing well with regard to his left posterior lower extremity ulcer. He has been tolerating the dressing changes without complication. This is not completely healed but is getting much closer. We did order a Farrow wrap 4000 for him he has received this and has it with him today although I am not sure we are quite ready to start him on that as of yet. We are very close. 02/05/2019 on evaluation today patient actually appears to be doing quite well with regard to his  left posterior lower extremity ulcer. He still has a very tiny opening remaining but the fortunate thing is he seems to be healing quite nicely. He also did get his Farrow wrap which I am hoping will help with his edema control as well at home. Fortunately there is no evidence of active infection. 02/12/2019 patient and fortunately appears to be doing poorly  in regard to his wounds of the left lower extremity. He was very close to healing therefore we attempted to use his Velcro compression wraps continuing with lymphedema pumps at home. Unfortunately that does not seem to have done very well for him. He tells me that he wore them all the time but again I am not sure why if that is the case that he is having such significant edema. He is still on his fluid pills as well. With that being said there is no obvious sign of infection although I do wonder about the possibility of infection at this time as well. 02/19/2019 unfortunately upon evaluation today patient appears to be doing more poorly with regard to his left lower extremity. He is not showing signs of significant improvement and I think the biggest issue here is that he does have an infection that appears to likely be Pseudomonas. That is based on the blue-green drainage that were noted at this time. Unfortunately the antibiotic that has been on is not going to take care of this at all. I think they will get a need to switch him to either Levaquin or Cipro and this was discussed with the patient. 02/26/2019 on evaluation today patient's lower extremity on the left appears to be doing significantly better as compared to last evaluation. Fortunately there is no signs of active infection at this time. He has been tolerating the compression wrap without complication in fact he made it the whole week at this point. He is showing signs of excellent improvement I am very happy in this regard. With that being said he is having some issues with infection we did review the results of his culture which I noted today. He did have a positive finding for Enterobacter as well as Alcaligenes faecalis. Fortunately the Levaquin that I placed him on will work for both which is great news. There is no signs of systemic infection at this point. 10/30; left posterior leg wound in the setting of very significant edema and  what looks like chronic venous inflammation. He has compression pumps but does not use them. We have been using 3 layer compression. Silver alginate to the wound as the primary dressing 03/18/2019 on evaluation today patient appears to be doing a little better compared to last time I saw him. He really has not been using his compression pumps he tells me that he is having too much discomfort. He has been keeping his wraps on however. He is only been taking his fluid pills every other day because he states they are not really helping and he has an appointment with his primary care provider at the [facility] tomorrow. Subsequently the wound itself on the left lower extremity does seem to be greatly improved compared to previous. [name], [name] ([id number]) 03/25/2019 on evaluation today patient appears to be doing better with regard to his wounds on the bilateral lower extremities. The left is doing excellent the right is also doing better although both still do show some signs of open wounds noted at this point unfortunately. Fortunately there is no signs of active infection at this time. The  patient also is not really having any significant pain which is good news. Unfortunately there was some confusion with the referral on vascular disease and as far as getting the patient scheduled there can be contacting him later today to do this fortunately we got this straightened out. 04/01/2019 on evaluation today patient appears to be doing no fevers, chills, nausea, vomiting, or diarrhea. Excellent at this time with regard to his lower extremities. There does not appear to be any open wound at this point which is good news. Fortunately is also no signs of active infection at this time. Overall feel like the patient has done excellent with the compression the problem is every time we got him to this point and then subsequently go to using his own compression things just go right back to where they were. I am not sure  how to address this we can try to get an appointment with vascular for 2 weeks now they have yet to call him. Obviously this has become frustrating for the patient as well. I think the issue has just been an honest error as far as scheduling is concerned but nonetheless still worn out the point where I am unsure of which direction we should take. 04/08/2019 on evaluation today patient actually appears to be doing well with regard to his lower extremities. There are no open wounds at this time and things seem to be managing quite nicely as far as the overall edema control is concerned. With that being said he does have his compression socks today for [place] to go ahead and reinitiate therapy in that manner at this point. He is going to be going for shoes to be measured on Wednesday and then coincidentally he will also be seeing vascular on Thursday. Overall I think this is good news and again I am hopeful that they will be able to do something for him to help prevent ongoing issues with edema control as well. No fevers, chills, nausea, vomiting, or diarrhea. 04/11/2019 on evaluation today patient actually appears to be doing poorly after just being discharged on Monday of this week. He had been experiencing issues with again blisters especially on the left lower extremity. With that being said he was completely healed and appeared to be doing great this past Monday. He then subsequently has new blisters that formed before his appointment with vascular this morning. He was also measured for shoes in the interim. With that being said we may have figured out what exactly is going on and why he continues to have issues like what we are seeing at this point. He takes his compression stockings off at nighttime and then he ends up having to sleep in his chair for 5-6 hours a night. He sleeps with his feet down he cannot really get him up in the recliner and therefore he is sleeping and the worst possible  his position with his feet on the floor for that majority of the time. Again as I explained to him that is about one third at minimum at least one fourth of his day that he spending with his feet dangling down on the ground and the worst possible position they could be. I think this may be what is causing the issue. Subsequently I am leaning toward thinking that he may need a hospital bed in order to elevate his legs. We likely can have to coordinate this with his primary care provider at the [facility]. Readmission: 01/26/2021 this is a patient who  presents for repeat evaluation here in the clinic although it is actually been couple of years since have seen him in fact it was December 2020 when I last saw him. Subsequently he never really healed but did end up being lost to follow-up. He tells me has been having issues ongoing with his lower extremities has bilateral lower extremity lymphedema no real significant or definitive open wounds but in general his lymphedema is way out of control. We were never able to refer him to lymphedema clinic simply due to the fact to be honest we were never able to get him completely healed. I do not see anyone with open wounds. The patient does have evidence of type 2 diabetes mellitus, lymphedema, chronic venous insufficiency, and hypertension. That really has not changed since his last evaluation. 02/09/2021 upon evaluation today patient appears to be doing a little better in regard to his legs although he still having a tremendous amount of drainage especially on the left leg. Fortunately there does not appear to be any evidence of active infection. Of note when we looked into this further it appears that the patient did not have any absorptive dressing on it was just the 4-layer compression wrap. Nonetheless this is probably big part of the issue here. 10/10; he comes in today with 3 large areas on the upper right lower leg likely remanence of denuded  blistering under his compression wraps. He has no other wounds on the right. On the left he has the denuded area on the left medial foot and ankle and on the left dorsal foot. Massive lymphedema in both feet dorsally. Using Zetuvit under compression We have increased home health visitation to twice a week to change the dressings and will change it once 02/22/2021 upon evaluation today patient appears to be doing well currently with regard to his wounds. He has been tolerating the dressing changes without complication. Fortunately there does not appear to be any evidence of active infection which is great news. No fevers, chills, nausea, vomiting, or diarrhea. The biggest issue I see currently is that home health is not putting any medicine on the actual wounds before wrapping. 03/01/2021 upon evaluation today the patient's right leg actually appears to be doing quite well which is great news there does not appear to be any evidence of active infection at this time. No fevers, chills, nausea, vomiting, or diarrhea. With that being said the patient is having issues on the left foot where he is having significant drainage is also an ammonia smell he does not have any animals at home and this makes me concerned about a bacteria producing urea as a byproduct. Again the possible common organisms will be E. coli, Proteus, and Enterococcus. All 3 of which can be successfully treated with Levaquin. For that reason I think that this may be a good option for [place] to consider placing him on and I did obtain a culture as well for confirmation sake. 03/08/2021 upon evaluation today patient appears to be doing unfortunately still somewhat poorly in regard to his leg ulcerations. He actually has an area on the right leg where he blistered due to the fact that his wrap slid down and caused an area of pinching on his skin and this has led to a significant issue here. 03/15/2021 upon evaluation today patient  unfortunately has not been wrapped appropriately with absorptive dressings nor with the appropriate technique for the third layer of the 4-layer compression wrap. These are issues that we continue  to try to address with the home health nurse. Also the absorptive dressing that she had was cut in half and therefore that causes things to leak out it does not actually trap the fluid in regard to the top of the foot overall I think that all these combined are really not seeing things improved significantly here. Fortunately there does not appear to be any signs of significant infection at this time which is good news. He still is having a tremendous amount of drainage. 03/22/2021 upon evaluation today patient appears to be draining tremendously. He still continues to tell me that he is using his pumps 2 times a day and that coupled with that tells me that he is elevating his legs as well. With that being said all things considered I am really just not seeing the improvement we would expect to see with the 4-layer compression wrap and all the above noted. He in fact had an extremely large Zetuvit dressing on both legs and that they were extremely filled to the max with fluid. This is after just being changed just before the weekend and this [name], [name] ([id number]) is Monday. Nonetheless I am concerned about the fact that there is something going on fairly significant that we cannot get any of this under control and that he is draining this significantly. He supposed be having an echocardiogram it sounds like scheduling has been an issue for him as far as getting in sooner. Its something to do with needing his cousin to drive him because of where it sat and he cannot drive himself to this appointment either way I really think he needs to try to see what he can do about making this happen a little sooner. He tells me he will call today. 03/29/2021 on evaluation today patient appears to be doing about the  same in regard to his legs. He did get his cardiology appointment moved up to 6 December which is at least good that is better than what it was before mid December. Overall very pleased in that regard. 04/05/2021 upon evaluation today patient unfortunately is still doing fairly poorly. There does not appear to be any signs of active infection at this time. No fevers, chills, nausea, vomiting, or diarrhea. Unfortunately I think until his edema is under control and overall fluid overload there is really not to be much chance that I can do much to get him better. This is quite unfortunate and frustrating both for myself and the patient to be perfectly honest. Nonetheless I think that he really needs to have a conversation both with his primary care provider as well as cardiologist he sees the PCP on Monday and cardiology on Wednesday of next week. 04/13/2021 upon evaluation today patient appears to be doing poorly in regard to his bilateral lower extremities his left is still worse than the right. With that being said he has a tremendous amount of drainage he did see his primary care provider yesterday there really was not much there to be done from their perspective. He sees cardiology tomorrow. Nonetheless my biggest concern here is simply that if we do not get the edema under control he is going to continue to have drainage and honestly I think at some point he is going to become infected severely that is my main concern. 04/19/2021 upon evaluation today patient appears to be doing poorly still in regard to his legs. Unfortunately there does not appear to be any signs of infection at this point. He  does have a tremendous amount of drainage however. We have not seen the results back from the cardiologist and the echocardiogram that was done. It appears that the patient checked out okay as far as that is concerned with regard to ejection fraction though we still have some issues here to be honest with  his diastolic function. I am unsure if this is accounting for everything that we are seeing or not. Either way he has a tremendous amount of drainage from his legs that we are just not able to control in the outpatient setting at this point. I have reached out to Dr. Rockey Situ his cardiologist to see once he reviews the sheet if there is anything that he feels like can be done from an outpatient perspective if not then I think the way to go is probably can to be through inpatient admission and diuresis. Otherwise I am not sure how working to get this under control we tried antibiotics, compression wrapping, and I have told the patient to be elevating his legs I am not sure how much he does of this but either way I think that this is still an ongoing issue nonetheless. 04/26/2021 upon evaluation today patient appears to be doing poorly in regard to his legs. He is having a tremendous amount of fluid at this point which is quite unfortunate. Its to the point that he may have had at least 5 to 10 pounds of fluid in his dressings this morning when they were removed these were changed this Friday. Subsequently I think he needs to go to the ER for further evaluation and treatment I think is probably can need diuresis possibly even IV antibiotics been on what the blood work looks like but in general I feel like he needs something to get this under control from an outpatient perspective absent of everywhere I can think of and I cannot get this under control with our traditional measures. I think this is going require more so that we can get him better 12/30; this is a patient with severe bilateral lymphedema. He was hospitalized from 04/26/2021 through 04/29/2021 treated for cellulitis in the setting of lower extremity ulcers and lymphedema. After he left the hospital he is apparently seen for nurse visit our staff contacted cardiology and he has been started on Lasix 40 mg. Apparently his legs have less edema.  Lab work from 05/04/2021 showed a BUN of 38 and creatinine of 1.59 these are elevated versus previous where his creatinine seems to have been 1.30 on 12/19 his potassium is 4.3. I believe the lab work is being followed by cardiology We have him in a 4-layer wrap. Xeroform on the leg wounds and sit to fit on the Berry damage skin on the left dorsal foot versus right dorsal foot. He has compression pumps but does not use them. We have apparently not yet ordered him compression stockings 05/17/2021 upon evaluation today patient's legs though better than last time I personally saw him appear to be getting worse compared to where they were previous. Dr. Quentin Cornwall was actually last 1 to see you I have not seen him since 19 December. That was before he went into the ER. Coming out apparently his legs looked also and they still look better but not as good as they were in the past. 1/16; patient with severe bilateral lymphedema. Severe scaled hyperkeratotic skin on the dorsal aspect of his distal left foot and left medial ankle.. On the right side changes are not as  bad. He did not have any weeping edema. Our intake nurse was convinced that he is being compliant with compression pumps 1 hour twice a day 05/31/2021 upon evaluation today patient actually appears to be doing a little bit better in my opinion in regard to his feet. I do not see as much drainage and it being just completely wet as it was previous. Fortunately I do not also see any signs of active infection which is great news as well. 06/07/21 Upon inspection patient's wound bed actually showed signs of doing well he is not nearly as weepy and wet as he has been in the past and overall very pleased in that regard. Fortunately I do not see any signs of active infection locally or nor systemically at this time. Which is great news. No fevers, chills, nausea, vomiting, or diarrhea. 06/14/2021 upon evaluation today patient appears to be doing well with  regard to his right foot I am pleased in that regard. His left foot is still draining quite a bit despite using lymphedema pumps, 4-layer compression wraps, and he tells me elevating his legs as well. He also has Lasix that he takes twice a day. Nonetheless I believe that this is still good to be an ongoing issue. We have a hard time getting this under control as far as the swelling is concerned. 06/21/2021 upon evaluation today patient appears to be doing decently well in regard to his wounds all things considered. He still has a tremendous amount of drainage and fluid noted at this point. Fortunately I do not see any signs of active infection locally or systemically at this point which is great news. Nonetheless I am unsure where to go and how to do this as far as trying to limit his swelling and weeping from his toes in particular. 06/28/2021 upon evaluation patient unfortunately continues to have significant drainage from his feet. We have been keeping him in a compression wrap and despite this he still continues to have extreme fluid issues he seen his cardiologist he is seeing the nephrologist. We really cannot find any way to get this under control when he did well was when he was in the hospital and they got some of the fluid away. But outside of that we are just struggling to achieve the long-term goal of getting this under control and keeping it under control unfortunately. 07/05/2021 upon evaluation today patient appears to be doing poorly in regard to his feet. Unfortunately this continues to be a significant issue and to be honest I am really not certain what to do about it. I referred him to Dr. Randol Kern at [facility] and he does have an appointment although it is [name], [name] ([id number]) 12 April. He also sees his primary care provider on 6 April. He did not want to see Dr. Haynes Kerns until after he saw his PCP that is the reason the appointment so far out. There is really not much I can do in  that regard. Nonetheless I do think that we are still continue to have significant lymphedema issues with significant mount of weeping in regard to the feet and again this has just become extremely difficult to manage to be honest I am not sure if there is something else that Dr. Haynes Kerns or someone else could recommend he also will be seeing Dr. Dellia Nims in 2 weeks when I am on vacation and at that time I will see if Dr. Dellia Nims has any ideas about where to go from here in  the meantime. 07/12/2021 upon evaluation today patient appears to be doing about as well as can be expected with regard to his feet. He does actually see his kidney doctor this Friday. He also will be seeing his primary care provider on April 4 and then following that around mid April he will be seeing Dr. Haynes Kerns at [facility] which was a referral made for him. Again my goal is to try to find out some way to fix this and to be perfectly honest we have had some issues with making any good adjustments. When he was in the hospital and greater amounts of Lasix he was able to get this down and it looked much better upon discharge. With that being said right now things just are not doing nearly as good as what they used to be. 3/13; patient presents for follow-up. He has been using his lymphedema pumps over the past week. He reports an increase in his Lasix dose. He has no issues or complaints today. He denies signs of infection. 07/26/2021 upon evaluation today patient appears to be doing better in regard to his feet bilaterally. Both are showing signs of much less drainage which is great news and overall very pleased in that regard. Fortunately there does not appear to be any evidence of active infection locally or systemically at this time. No fevers, chills, nausea, vomiting, or diarrhea. 08/02/2021 upon evaluation today patient appears to be doing well with regard to his feet. Both are showing signs of being drier the right pretty much has not  really draining much at all which is great news. The left is not draining anywhere close to his much as it was during the last evaluation. This is excellent news and overall very pleased. 08/09/2021 upon evaluation today patient appears to be doing well with regard to his legs the right leg especially showing signs of excellent improvement which is great news I do not see any evidence of active infection locally or systemically which is great. In regard to the left leg he still has some weeping and drainage but nothing as significant as what it was in the past this is great news. 08-16-2021 upon evaluation today patient appears to actually be doing quite well in my opinion in regard to his feet. This is significantly improved compared to what we previously seen and overall I am extremely pleased in that regard. I do believe that He is actually improving although this is obviously very slow going. 08-23-2021 upon evaluation today patient appears to be doing well currently in regard to his right leg which actually is pretty dry at this point today. Fortunately I do not see any evidence of active infection at this time which is great news. No fevers, chills, nausea, vomiting, or diarrhea. 08-30-2021 upon evaluation today patient appears to be doing well with regard to his lower extremities. The right foot is pretty much completely dry which is great news the left foot though not completely dry seems to be doing decently well. I do not see any signs of active infection locally or systemically which is great news. No fevers, chills, nausea, vomiting, or diarrhea. 09-06-2021 upon evaluation today patient appears to be doing well with regard to his feet in fact now the left foot is almost completely dry as well and I am definitely seeing a lot of significant improvement. With that being said unfortunately he actually appears to have some cellulitis of his right thigh. His toes are also little bit red but this  may  just be due to the increased swelling. He really is not warm to touch in regard to the toes. 5/8; excellent edema control on the right foot and lower leg there is no open wounds but we continue to put compression on this otherwise this will breakdown. He is using his compression pumps twice a day at home The area that is problematic is on the left dorsal foot some areas that are not fully epithelialized with very dry fissured skin over this area. 09-20-2021 upon evaluation today patient appears to be doing a little bit worse in regard to swelling at this time. Fortunately I do not see any signs of infection with that being said the wrap was not on quite as well as what I would like to have seen. I do believe that this has caused a little bit of excess swelling and again we need to try to get this under good control. 09-27-2021 upon evaluation today patient appears to be doing awesome in regard to his feet and legs. Everything is measuring smaller the swelling is down and to be perfectly honest I am extremely pleased with the end of his feet especially on the left side and how dry this is today. I do think we are on the right track here. 5/31; patient presents for follow-up. He is using nystatin powder to the feet bilaterally under 4-layer compression. He has no issues or complaints today. He states he is going to the lymphedema clinic tomorrow. Since he has no open wounds on the right lower extremity they will be focused on the side. 10-11-2021 upon evaluation today patient appears to be doing excellent in regard to his legs. He has been tolerating the dressing changes without complication. Fortunately there does not appear to be any evidence of active infection locally or systemically at this time which is great news. No fevers, chills, nausea, vomiting, or diarrhea. 10-18-2021 upon evaluation today patient appears to be doing well with regard to his legs. He has been tolerating the dressing changes  without complication and actually seen in lymphedema clinic now in regard to his right leg were taken care of the left leg. Fortunately I do not see any evidence of active infection locally or systemically at this time. 10-26-2021 upon evaluation today patient's wounds actually are showing signs of doing well in fact he really does not have wounds as much is weeping of the lower extremities. Fortunately I do not see any evidence of active infection locally or systemically which is great news. No fever or chills noted 11-02-2021 upon evaluation today patient appears to be doing decently well in regard to his legs. Of actually been on the phone quite a bit discussing with [facility] orthotics custom compression for him. I also have been discussing everything with his lymphedema clinic provider as well. Helene Kelp notes that there is really not much more that she can do for him which was noted last week as well. Subsequently I do believe that the patient would benefit from getting custom compression in fact I think that is what he is going require to keep anything under control here. He voiced understanding. 11-08-2021 upon evaluation today patient appears to be doing excellent in regard to his legs and feet. Fortunately I do not see any signs of infection at this time which is great news. No fever or chills noted again I am still been working on trying to figure out what exactly we need to do as far as getting the [place]  involved in coverage for custom compression for this patient. I also discussed with him that he does need to have [place], [name] ([id number]) which I completely understand. With that being said it does make it difficult thinking about what kind of combination of shoes and compression is going to be best for him for the long-term. Fortunately I do not see any evidence of active infection locally or systemically which is great news. Overall I think you are doing quite well. 11-16-2021 upon  evaluation today patient appears to be doing well currently in regard to his legs and feet. Everything is significantly smaller compared to what has been. Fortunately I do not see any evidence of active infection locally or systemically at this time which is great news. 11-23-2021 upon evaluation today patient appears to be doing well with regard to his legs bilaterally. We can actually initiate treatment with a recommendation today to utilize Tubigrip and try to see if we can get this under better control. The patient is in agreement with the plan. Nonetheless the big question is whether this will be something that he can utilize at home in order to keep his swelling down and allow him to be able to function without having to come into the clinic as frequently. He notes that he would love to get to this point. He does have a cousin who is willing to help him with change out the Tubigrip when necessary. 7/25; the patient does not have any open wounds however his left leg is a lot more swollen than it was last week. His cousin came in who presumably would be the one to change his juxta lites to practice on doing this for the patient. She apparently lives in the [place] block. In the meantime he has been using Tubigrip 12-07-2021 upon evaluation today patient appears to be doing well with regard to his legs although we are transitioning to try to get him to use the Tubigrip which again has been a little bit of a setback but not too much overall I think he is still doing well he still able to wear his current shoes which is good news. He does have a cousin who is willing to help him with the Tubigrip and we are training her how to do this as well. She is here today. 12-14-2021 upon evaluation today patient appears to be doing well currently in regard to his right leg which I think is doing excellent with the Tubigrip. With regard to the left leg unfortunately this is not doing nearly as well and will get me to  do something to intervene here. I think getting back into a 4-layer wraps can be the way to go short-term To get this back under control and the patient voiced understanding. Objective Constitutional Well-nourished and well-hydrated in no acute distress. Vitals Time Taken: 11:07 AM, Weight: 312 lbs, Temperature: 97.7 F, Pulse: 59 bpm, Respiratory Rate: 18 breaths/min, Blood Pressure: 160/69 mmHg. Respiratory normal breathing without difficulty. Psychiatric this patient is able to make decisions and demonstrates good insight into disease process. Alert and Oriented x 3. pleasant and cooperative. General Notes: Upon inspection patient's wound bed actually showed signs of doing quite well there was some slough and biofilm noted on the surface of the wound I did perform some debridement just with saline gauze to clear this away no sharp debridement was necessary this is on the left anterior portion of the ankle. Postdebridement this seems to be doing much better. 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 Would recommend currently that we go ahead and continue with the recommendation for a 4-layer compression wrap on the left leg which has done well in the past we will continue with the silver cell. 2. I am also can recommend that we continue with the Tubigrip for the right leg. 3. I am also going to suggest we have the patient continue to monitor for any signs of worsening infection if anything changes he should contact the office and let me know. We will see  patient back for reevaluation in 1 week here in the clinic. If anything worsens or changes patient will contact our office for additional recommendations. Electronic Signature(s) Signed: 12/14/2021 11:32:51 AM By: Worthy Keeler PA-C Entered By: Worthy Keeler on 12/14/2021 11:32:51 [name] ([id number]) -------------------------------------------------------------------------------- SuperBill Details Patient Name: [name] Date of Service: 12/14/2021 Medical Record Number: [id number] Patient Account Number: [id number] Date of Birth/Sex: [birth year] (71 y.o. M) Treating RN: [name] Primary Care Provider: Fontaine No Other Clinician: Referring Provider: Fontaine No Treating Provider/Extender: Skipper Cliche in Treatment: 46 Diagnosis Coding ICD-10 Codes Code Description E11.622 Type 2 diabetes mellitus with other skin ulcer I89.0 Lymphedema, not elsewhere classified I87.2 Venous insufficiency (chronic) (peripheral) L97.822 Non-pressure chronic ulcer of other part of left lower leg with fat layer exposed L97.512 Non-pressure chronic ulcer of other part of right foot with fat layer exposed I10 Essential (primary) hypertension L97.811 Non-pressure chronic ulcer of other part of right lower leg limited to breakdown of skin Facility Procedures CPT4 Code: 93570177 Description: (Facility Use Only) [phone] - [place] L[facility] LT LEG Modifier: Quantity: 1 Physician Procedures CPT4 Code: 9233007 Description: 99213 - WC PHYS LEVEL 3 - EST PT Modifier: Quantity: 1 CPT4 Code: Description: ICD-10 Diagnosis Description E11.622 Type 2 diabetes mellitus with other skin ulcer I89.0 Lymphedema, not elsewhere classified I87.2 Venous insufficiency (chronic) (peripheral) L97.822 Non-pressure chronic ulcer of other part of left lower  leg with fat layer Modifier: exposed Quantity: Electronic Signature(s) Signed: 12/14/2021 12:44:32 PM By: [name] RN Signed:  12/14/2021 3:58:52 PM By: Worthy Keeler PA-C Previous Signature: 12/14/2021 11:33:21 AM Version By: Worthy Keeler PA-C Entered By: [name] on 12/14/2021 12:44:32

## 2021-12-14 NOTE — Anesthesia Postprocedure Evaluation (Signed)
Anesthesia Post Note  Patient: [name]  Procedure(s) Performed: COLONOSCOPY WITH PROPOFOL  Patient location during evaluation: PACU Anesthesia Type: General Level of consciousness: awake and alert Pain management: pain level controlled Vital Signs Assessment: post-procedure vital signs reviewed and stable Respiratory status: spontaneous breathing, nonlabored ventilation, respiratory function stable and patient connected to nasal cannula oxygen Cardiovascular status: blood pressure returned to baseline and stable Postop Assessment: no apparent nausea or vomiting Anesthetic complications: no   No notable events documented.   Last Vitals:  Vitals:   12/13/21 1138 12/13/21 1148  BP: 124/64 (!) 143/60  Pulse: 84 (!) 59  Resp: 13 17  Temp:    SpO2: 98% 94%    Last Pain:  Vitals:   12/13/21 1148  TempSrc:   PainSc: 0-No pain                 Dimas Millin

## 2021-12-16 NOTE — Progress Notes (Signed)
HASAN, DOUSE ([id number]) Visit Report for 12/14/2021 Arrival Information Details Patient Name: [name], [name] Date of Service: 12/14/2021 11:00 AM Medical Record Number: [id number] Patient Account Number: [id number] Date of Birth/Sex: 1950-07-18 (71 y.o. M) Treating RN: [name] Primary Care Javarie Crisp: Fontaine No Other Clinician: Referring Alilah Mcmeans: Fontaine No Treating Adaley Kiene/Extender: Skipper Cliche in Treatment: 65 Visit Information History Since Last Visit All ordered tests and consults were completed: No Patient Arrived: Ambulatory Added or deleted any medications: No Arrival Time: 11:02 Any new allergies or adverse reactions: No Accompanied By: cousion Had a fall or experienced change in No Transfer Assistance: None activities of daily living that may affect Patient Identification Verified: Yes risk of falls: Secondary Verification Process Completed: Yes Signs or symptoms of abuse/neglect since last visito No Patient Requires Transmission-Based No Hospitalized since last visit: No Precautions: Implantable device outside of the clinic excluding No Patient Has Alerts: Yes cellular tissue based products placed in the center Patient Alerts: AVVS consult on file since last visit: Last ABI-R 1.09; L Has Dressing in Place as Prescribed: Yes 1.04 Has Compression in Place as Prescribed: Yes Pain Present Now: No Electronic Signature(s) Signed: 12/16/2021 12:35:42 PM By: [name] RN Entered By: [name] on 12/14/2021 11:07:00 [name] ([id number]) -------------------------------------------------------------------------------- Clinic Level of Care Assessment Details Patient Name: [name] Date of Service: 12/14/2021 11:00 AM Medical Record Number: [id number] Patient Account Number: [id number] Date of Birth/Sex: May 29, 1950 (71 y.o. M) Treating RN: [name] Primary Care Duglas Heier: Fontaine No Other Clinician: Referring Eyleen Rawlinson:  Fontaine No Treating Miu Chiong/Extender: Skipper Cliche in Treatment: 46 Clinic Level of Care Assessment Items TOOL 1 Quantity Score _0  - Use when EandM and Procedure is performed on INITIAL visit 0 ASSESSMENTS - Nursing Assessment / Reassessment _1  - General Physical Exam (combine w/ comprehensive assessment (listed just below) when performed on new 0 pt. evals) _2  - 0 Comprehensive Assessment (HX, ROS, Risk Assessments, Wounds Hx, etc.) ASSESSMENTS - Wound and Skin Assessment / Reassessment _3  - Dermatologic / Skin Assessment (not related to wound area) 0 ASSESSMENTS - Ostomy and/or Continence Assessment and Care _4  - Incontinence Assessment and Management 0 _5  - 0 Ostomy Care Assessment and Management (repouching, etc.) PROCESS - Coordination of Care _6  - Simple Patient / Family Education for ongoing care 0 _7  - 0 Complex (extensive) Patient / Family Education for ongoing care _8  - 0 Staff obtains Programmer, systems, Records, Test Results / Process Orders _9  - 0 Staff telephones HHA, Nursing Homes / Clarify orders / etc _10  - 0 Routine Transfer to another Facility (non-emergent condition) _11  - 0 Routine Hospital Admission (non-emergent condition) _12  - 0 New Admissions / Biomedical engineer / Ordering NPWT, Apligraf, etc. _13  - 0 Emergency Hospital Admission (emergent condition) PROCESS - Special Needs _14  - Pediatric / Minor Patient Management 0 _15  - 0 Isolation Patient Management _16  - 0 Hearing / Language / Visual special needs _17  - 0 Assessment of Community assistance (transportation, D/C planning, etc.) _18  - 0 Additional assistance / Altered mentation _19  - 0 Support Surface(s) Assessment (bed, cushion, seat, etc.) INTERVENTIONS - Miscellaneous _20  - External ear exam 0 _21  - 0 Patient Transfer (multiple staff / Civil Service fast streamer / Similar devices) _22  - 0 Simple Staple / Suture removal (25 or less) _23  - 0 Complex Staple / Suture removal (26 or more) _24  -  0 Hypo/Hyperglycemic Management (do not check if billed separately) _25  - 0 Ankle / Brachial Index (ABI) - do not check if billed separately Has the patient been  seen at the hospital within the last three years: Yes Total Score: 0 Level Of Care: ____ [name] ([id number]) Electronic Signature(s) Signed: 12/16/2021 12:35:42 PM By: [name] RN Entered By: [name] on 12/14/2021 12:44:19 [name] ([id number]) -------------------------------------------------------------------------------- Compression Therapy Details Patient Name: [name] Date of Service: 12/14/2021 11:00 AM Medical Record Number: [id number] Patient Account Number: [id number] Date of Birth/Sex: [birth year] (71 y.o. M) Treating RN: [name] Primary Care Arhaan Chesnut: Fontaine No Other Clinician: Referring Tedra Coppernoll: Fontaine No Treating Keanan Melander/Extender: Skipper Cliche in Treatment: 46 Compression Therapy Performed for Wound Assessment: NonWound Condition Lymphedema - Left Leg Performed By: Clinician [name], RN Compression Type: Four Layer Post Procedure Diagnosis Same as Pre-procedure Electronic Signature(s) Signed: 12/14/2021 12:36:59 PM By: [name] RN Entered By: [name] on 12/14/2021 12:36:59 [name] ([id number]) -------------------------------------------------------------------------------- Encounter Discharge Information Details Patient Name: [name] Date of Service: 12/14/2021 11:00 AM Medical Record Number: [id number] Patient Account Number: [id number] Date of Birth/Sex: 1950-10-09 (71 y.o. M) Treating RN: [name] Primary Care Rino Hosea: Fontaine No Other Clinician: Referring Samael Blades: Fontaine No Treating Meryem Haertel/Extender: Skipper Cliche in Treatment: 65 Encounter Discharge Information Items Discharge Condition: Stable Ambulatory Status: Cane Discharge Destination: Home Transportation: Private Auto Accompanied By:  caregiver Schedule Follow-up Appointment: Yes Clinical Summary of Care: Electronic Signature(s) Signed: 12/14/2021 12:45:25 PM By: [name] RN Entered By: [name] on 12/14/2021 12:45:25 [name] ([id number]) -------------------------------------------------------------------------------- Lower Extremity Assessment Details Patient Name: [name] Date of Service: 12/14/2021 11:00 AM Medical Record Number: [id number] Patient Account Number: [id number] Date of Birth/Sex: [birth year] (71 y.o. M) Treating RN: [name] Primary Care Landon Bassford: Fontaine No Other Clinician: Referring Jhada Risk: Fontaine No Treating Ardene Remley/Extender: Skipper Cliche in Treatment: 46 Edema Assessment Assessed: [Left: No] [Right: No] Edema: [Left: Yes] [Right: No] Calf Left: Right: Point of Measurement: 38 cm From Medial Instep 42 cm 38 cm Ankle Left: Right: Point of Measurement: 14 cm From Medial Instep 32 cm 24 cm Vascular Assessment Pulses: Dorsalis Pedis Palpable: [Left:Yes] [Right:Yes] Electronic Signature(s) Signed: 12/14/2021 12:36:29 PM By: [name] RN Entered By: [name] on 12/14/2021 12:36:29 [name] ([id number]) -------------------------------------------------------------------------------- Multi Wound Chart Details Patient Name: [name] Date of Service: 12/14/2021 11:00 AM Medical Record Number: [id number] Patient Account Number: [id number] Date of Birth/Sex: 1950-06-11 (71 y.o. M) Treating RN: [name] Primary Care Jarmel Linhardt: Fontaine No Other Clinician: Referring Kathrin Folden: Fontaine No Treating Crystalann Korf/Extender: Skipper Cliche in Treatment: 46 Vital Signs Height(in): Pulse(bpm): 59 Weight(lbs): 312 Blood Pressure(mmHg): 160/69 Body Mass Index(BMI): Temperature(F): 97.7 Respiratory Rate(breaths/min): 18 Wound Assessments Treatment Notes Electronic Signature(s) Signed: 12/14/2021 12:36:44 PM By: [name] RN Entered By: [name] on 12/14/2021 12:36:44 [name] ([id number]) -------------------------------------------------------------------------------- Multi-Disciplinary Care Plan Details Patient Name: [name] Date of Service: 12/14/2021 11:00 AM Medical Record Number: [id number] Patient Account Number: [id number] Date of Birth/Sex: 1950-11-22 (71 y.o. M) Treating RN: [name] Primary Care Jamiria Langill: Fontaine No Other Clinician: Referring Mallory Enriques: Fontaine No Treating Viktor Philipp/Extender: Skipper Cliche in Treatment: 46 Active Inactive Soft Tissue Infection Nursing Diagnoses: Impaired tissue integrity Knowledge deficit related to disease process and management Knowledge deficit related to home infection control: handwashing, handling of soiled dressings, supply storage Potential for infection: soft tissue Goals: Patient/caregiver will verbalize understanding of or measures to prevent infection and contamination in the home setting Date Initiated: 09/06/2021 Date Inactivated: 10/18/2021 Target Resolution Date: 09/06/2021 Goal Status: Met Patient's soft tissue infection will resolve Date Initiated: 09/06/2021 Date Inactivated: 10/18/2021 Target Resolution Date: 09/06/2021 Goal Status: Met Signs  and symptoms of infection will be recognized early to allow for prompt treatment Date Initiated: 09/06/2021 Target Resolution Date: 11/28/2021 Goal Status: Active Interventions: Assess signs and symptoms of infection every visit Provide education on infection Treatment Activities: Education provided on Infection : 12/07/2021 Systemic antibiotics : 09/06/2021 Notes: Electronic Signature(s) Signed: 12/14/2021 12:36:36 PM By: [name] RN Entered By: [name] on 12/14/2021 12:36:36 [name] ([id number]) -------------------------------------------------------------------------------- Pain Assessment Details Patient Name: [name] Date of Service:  12/14/2021 11:00 AM Medical Record Number: [id number] Patient Account Number: [id number] Date of Birth/Sex: 07-09-1950 (71 y.o. M) Treating RN: [name] Primary Care Margee Trentham: Fontaine No Other Clinician: Referring Chrishonda Hesch: Fontaine No Treating Ioannis Schuh/Extender: Skipper Cliche in Treatment: 46 Active Problems Location of Pain Severity and Description of Pain Patient Has Paino No Site Locations Pain Management and Medication Current Pain Management: Electronic Signature(s) Signed: 12/16/2021 12:35:42 PM By: [name] RN Entered By: [name] on 12/14/2021 11:07:34 [name] ([id number]) -------------------------------------------------------------------------------- Patient/Caregiver Education Details Patient Name: [name] Date of Service: 12/14/2021 11:00 AM Medical Record Number: [id number] Patient Account Number: [id number] Date of Birth/Gender: May 14, 1950 (71 y.o. M) Treating RN: [name] Primary Care Physician: Fontaine No Other Clinician: Referring Physician: Fontaine No Treating Physician/Extender: Skipper Cliche in Treatment: 54 Education Assessment Education Provided To: Patient Education Topics Provided Infection: Methods: Explain/Verbal Responses: State content correctly Electronic Signature(s) Signed: 12/16/2021 12:35:42 PM By: [name] RN Entered By: [name] on 12/14/2021 12:44:45 [name] ([id number]) -------------------------------------------------------------------------------- Tangent Details Patient Name: [name] Date of Service: 12/14/2021 11:00 AM Medical Record Number: [id number] Patient Account Number: [id number] Date of Birth/Sex: 19-Nov-1950 (71 y.o. M) Treating RN: [name] Primary Care Noheli Melder: Fontaine No Other Clinician: Referring Quinterious Walraven: Fontaine No Treating Riad Wagley/Extender: Skipper Cliche in Treatment: 46 Vital Signs Time Taken:  11:07 Temperature (F): 97.7 Weight (lbs): 312 Pulse (bpm): 59 Respiratory Rate (breaths/min): 18 Blood Pressure (mmHg): 160/69 Reference Range: 80 - 120 mg / dl Electronic Signature(s) Signed: 12/16/2021 12:35:42 PM By: [name] RN Entered By: [name] on 12/14/2021 11:07:28

## 2021-12-20 ENCOUNTER — Encounter: Payer: PPO | Admitting: Occupational Therapy

## 2021-12-20 ENCOUNTER — Encounter: Payer: Self-pay | Admitting: Podiatry

## 2021-12-20 ENCOUNTER — Ambulatory Visit: Payer: PPO | Admitting: Podiatry

## 2021-12-20 DIAGNOSIS — M79674 Pain in right toe(s): Secondary | ICD-10-CM

## 2021-12-20 DIAGNOSIS — N179 Acute kidney failure, unspecified: Secondary | ICD-10-CM

## 2021-12-20 DIAGNOSIS — B351 Tinea unguium: Secondary | ICD-10-CM | POA: Diagnosis not present

## 2021-12-20 DIAGNOSIS — I890 Lymphedema, not elsewhere classified: Secondary | ICD-10-CM

## 2021-12-20 DIAGNOSIS — E11628 Type 2 diabetes mellitus with other skin complications: Secondary | ICD-10-CM

## 2021-12-20 DIAGNOSIS — I872 Venous insufficiency (chronic) (peripheral): Secondary | ICD-10-CM

## 2021-12-20 DIAGNOSIS — M79675 Pain in left toe(s): Secondary | ICD-10-CM

## 2021-12-20 NOTE — Progress Notes (Signed)
This patient returns to my office for at risk foot care.  This patient requires this care by a professional since this patient will be at risk due to having diabetes and venous insufficiency.  This patient is unable to cut nails himself since the patient cannot reach his nails.These nails are painful walking and wearing shoes.  Patient is wearing unna boots  B/L for his severe lymphedema.. This patient presents for at risk foot care today.  General Appearance  Alert, conversant and in no acute stress.  Vascular  Deferred due to unna boots.  Neurologic  Deferred due to unna boots.  Nails Thick disfigured discolored nails with subungual debris  from hallux to fifth toes bilaterally. No evidence of bacterial infection or drainage bilaterally.  Orthopedic  No limitations of motion  feet .  No crepitus or effusions noted.  No bony pathology or digital deformities noted.  Skin  normotropic skin with no porokeratosis noted bilaterally.  No signs of infections or ulcers noted.     Onychomycosis  Pain in right toes  Pain in left toes  Consent was obtained for treatment procedures.   Mechanical debridement of nails 1-5  bilaterally performed with a nail nipper.  Filed with dremel without incident.  .   Return office visit    3 months                 Told patient to return for periodic foot care and evaluation due to potential at risk complications.   Gardiner [name] DPM

## 2021-12-21 ENCOUNTER — Encounter: Payer: No Typology Code available for payment source | Admitting: Physician Assistant

## 2021-12-21 DIAGNOSIS — E11621 Type 2 diabetes mellitus with foot ulcer: Secondary | ICD-10-CM | POA: Diagnosis not present

## 2021-12-22 ENCOUNTER — Encounter: Payer: PPO | Admitting: Occupational Therapy

## 2021-12-22 NOTE — Progress Notes (Addendum)
[name], [name] ([id number]) Visit Report for 12/21/2021 Chief Complaint Document Details Patient Name: [name], [name] Date of Service: 12/21/2021 11:00 AM Medical Record Number: [id number] Patient Account Number: [id number] Date of Birth/Sex: 07-21-1950 (71 y.o. M) Treating RN: [name] Primary Care Provider: Fontaine No Other Clinician: Referring Provider: Fontaine No Treating Provider/Extender: Skipper Cliche in Treatment: 2 Information Obtained from: Patient Chief Complaint Left LE ulcers Electronic Signature(s) Signed: 12/21/2021 10:55:13 AM By: Worthy Keeler PA-C Entered By: Worthy Keeler on 12/21/2021 10:55:13 [name] ([id number]) -------------------------------------------------------------------------------- HPI Details Patient Name: [name] Date of Service: 12/21/2021 11:00 AM Medical Record Number: [id number] Patient Account Number: [id number] Date of Birth/Sex: Oct 01, 1950 (71 y.o. M) Treating RN: [name] Primary Care Provider: Fontaine No Other Clinician: Referring Provider: Fontaine No Treating Provider/Extender: Skipper Cliche in Treatment: 25 History of Present Illness HPI Description: 71 year old male who presented to the ER with bilateral lower extremity blisters which had started last week. he has a past medical history of leukemia, diabetes mellitus, hypertension, edema of both lower extremities, his recurrent skin infections, peripheral vascular disease, coronary artery disease, congestive heart failure and peripheral neuropathy. in the ER he was given Rocephin and put on Silvadene cream. he was put on oral doxycycline and was asked to follow-up with the [facility]. His last hemoglobin A1c was 6.6 in December and he checks his blood sugar once a week. He does not have any physicians outside the [place] system. He does not recall any vascular duplex studies done either for arterial or venous disease but was told  to wear compression stockings which he does not use 05/30/2016 -- we have not yet received any of his notes from the [facility] hospital system and his arterial and venous duplex studies are scheduled here in [place] around mid February. We are unable to have his insurance accepted by home health agencies and hence he is getting dressings only once a week. 06/06/16 -- -- I received a call from the patient's PCP at the [facility] and spoke to Dr. Garvin Fila, phone number [phone] and fax number [phone]. She confirmed that no vascular testing was done over the last 5 years and she would be happy to do them if the patient did want them to be done at the [place] and we could fax him a request. Readmission: 71 year old male seen by as in February of this year and was referred to vein and vascular for studies and opinion from the vascular surgeons. The patient returns today with a fresh problem having had blisters on his left lower extremity which have been there for about 5 days and he clearly states that he has been wearing his compression stockings as advised though he could not read the moderate compression and has been wearing light compression. Review of his electronic medical records note that he had lower extremity arterial duplex examination done on 06/23/2016 which showed no hemodynamically significant stenosis in the bilateral lower extremity arterial system. He also had a lower extremity venous reflux examination done on 07/07/2016 and it was noted that he had venous incompetence in the right great saphenous vein and bilateral common femoral veins. Patient was seen by Dr. Tamala Julian on the same day and for some reason his notes do not reflect the venous studies or the arterial studies and he recommended patient do a venous duplex ultrasound to look for reflux and return to see him.he would also consider a lymph pump if required. The patient was told that his workup  was normal and hence the  patient canceled his follow-up appointment. 02/03/17 on evaluation today patient left medial lower extremity blister appears to be doing about the same. It is still continuing to drain and there's still the blistered skin covering the wound bed which is making it difficult for the alternate to do its job. Fortunately there is no evidence of cellulitis. No fevers chills noted. Patient states in general he is not having any significant discomfort. Patient's lower extremity arterial duplex exam revealed that patient was hemodynamically stable with no evidence of stenosis in regard to the bilateral lower extremities. The lower extremity venous reflux exam revealed the patient had venous incontinence noted in the right greater saphenous and bilateral common femoral vein. There is no evidence of deep or superficial vein thrombosis in the bilateral lower extremities. Readmission: 11/12/18 Patient presents for evaluation our clinic today concerning issues that he is having with his left lower extremity. He tells me that a couple weeks ago he began developing blisters on the left lower extremity along with increased swelling. He typically wears his compression stockings on a regular basis is previously been evaluated both here as well is with vascular surgery they would recommend lymphedema pumps but unfortunately that somehow fell through and he never heard anything back from that. Nonetheless I think lymphedema pumps would be beneficial for this patient. He does have a history of hypertension and diabetes. Obviously the chronic venous stasis and lymphedema as well. At this point the blisters have been given in more trouble he states sometimes when the blisters openings able to clean it down with alcohol and it will dry out and do well. Unfortunately that has not been the case this time. He is having some discomfort although this mean these with cleaning the areas he doesn't have discomfort just on a regular  basis. He has not been able to wear his compression stockings since the blisters arose due to the fact that of course it will drain into the socks causing additional issues and he didn't have any way to wrap this otherwise. He has increased to taking his Lasix every day instead of every other day. He sees his primary care provider later this month as well. No fevers, chills, nausea, or vomiting noted at this time. 11/19/18-Patient returns at 1 week, per intake RN the amount of seepage into the compression wraps was definitely improved, overall all the wounds are measuring smaller but continuing silver alginate to the wounds as primary dressing 11/26/18 on evaluation today patient appears to be doing quite well in regard to his left lower [place] ulcers. In fact of the areas that were noted initially he only has two regions still open. There is no evidence of active infection at this time. He still is not heard anything from the company regarding lymphedema pumps as of yet. Again as previously seen vascular they have not recommended any surgical intervention. 12/03/2018 on evaluation today patient actually appears to be doing quite well with regard to his lower extremity ulcers. In fact most of the areas appear to be healed the one spot which does not seem to be completely healed I am unsure of whether or not this is really draining that much but nonetheless there does not appear to be any signs of infection or significant drainage at this point. There is no sign of fever, chills, nausea, vomiting, or diarrhea. Overall I am pleased with how things have progressed I think is very close to being able to transition  to his home compression stockings. [name], [name] ([id number]) 12/10/2018 upon evaluation today patient appears to be doing quite well with regard to his left lower extremity. He has been tolerating the dressing changes without complication. Fortunately there is no signs of active infection at this  time. He appears after thorough evaluation of his leg to only have 1 small area that remains open at this point everything else appears to be almost completely closed. He still have significant swelling of the left lower extremity. We had discussed discussing this with his primary care provider he is not able to see her in person they were at the [facility] and right now the [place] is not seeing patients on site. According to the patient anyway. Subsequently he did speak with her apparently and his primary care provider feels that he may likely have a DVT. With that being said she has not seen his leg she is just going off of his history. Nonetheless that is a concern that the patient now has as well and while I do not feel the DVT is likely we can definitely ensure that that is not the case I will go ahead and see about putting that order in today. Nonetheless otherwise I am in a recommend that we continue with the current wound care measures including the compression therapy most likely. We just need to ensure that his leg is indeed free of any DVTs. 12/17/2018 on evaluation today patient actually appears to be completely healed today. He does have 2 very small areas of blistering although this is not anything too significant at this point which is good news. With that being said I am in agreement with the fact that I think he is completely healed at this point. He does want to get back into his compression stocking. The good news is we have gotten approval from insurance for his lymphedema pumps we received a letter since last saw him last week. The other good news is his study did come back and showed no evidence of a DVT. 12/20/2018 on evaluation today patient presents for follow-up concerning his ongoing issues with his left lower extremity. He was actually discharged last Friday and did fairly well until he states blisters opened this morning. He tells me he has been wearing his compression  stocking although he has a hard time getting this on. There does not appear to be any signs of active infection at this time. No fevers, chills, nausea, vomiting, or diarrhea. 12/27/2018 on evaluation today patient appears to be doing very well with regard to his swelling of the left lower extremity the 4 layer compression wrap seems to have been beneficial for him. Fortunately there is no signs of active infection at this time. Patient has been tolerating the compression wrap without complication and his foot swelling in particular appears to be greatly improved. He does still have a wound on the lateral portion of his left leg I believe this is more of a blister that has now reopened. 01/03/2019 on evaluation today patient actually appears to be doing excellent in regard to his left lower extremity. He did receive his compression pumps and is actually use this 7 times since he was last here in the office. On top of the compression wrap he is now roughly 3 cm better at the calf and 2 cm better at the ankle he also states that his foot seem to go an issue better without even having to use a shoe horn. Obviously I  think this is all evidence that he is doing excellent in this regard. The other good news is he does not appear to have anything open today as far as wounds are concerned. 01/15/2019 on evaluation today patient appears to be doing more poorly yet again with regard to his left lower extremity. He has developed new wounds again after being discharged just recently. Unfortunately this continues to be the case that he will heal and then have subsequent new wounds. The last time I was hopeful that he may not end up coming back too quickly especially since he states he has been using his lymphedema pumps along with wearing his compression. Nonetheless he had a blister on the back of his leg that popped up on the left and this has opened up into an ulceration it is quite painful. 01/22/19 on evaluation  today patient actually appears to be doing well with regard to his wound on the left lower extremity. He's been tolerating the dressing changes without complication including the compression wrap in the wound appears to be significantly smaller today which is great news. Overall very pleased in this regard. 01/29/2019 on evaluation today patient appears to be doing well with regard to his left posterior lower extremity ulcer. He has been tolerating the dressing changes without complication. This is not completely healed but is getting much closer. We did order a Farrow wrap 4000 for him he has received this and has it with him today although I am not sure we are quite ready to start him on that as of yet. We are very close. 02/05/2019 on evaluation today patient actually appears to be doing quite well with regard to his left posterior lower extremity ulcer. He still has a very tiny opening remaining but the fortunate thing is he seems to be healing quite nicely. He also did get his Farrow wrap which I am hoping will help with his edema control as well at home. Fortunately there is no evidence of active infection. 02/12/2019 patient and fortunately appears to be doing poorly in regard to his wounds of the left lower extremity. He was very close to healing therefore we attempted to use his Velcro compression wraps continuing with lymphedema pumps at home. Unfortunately that does not seem to have done very well for him. He tells me that he wore them all the time but again I am not sure why if that is the case that he is having such significant edema. He is still on his fluid pills as well. With that being said there is no obvious sign of infection although I do wonder about the possibility of infection at this time as well. 02/19/2019 unfortunately upon evaluation today patient appears to be doing more poorly with regard to his left lower extremity. He is not showing signs of significant improvement and I  think the biggest issue here is that he does have an infection that appears to likely be Pseudomonas. That is based on the blue-green drainage that were noted at this time. Unfortunately the antibiotic that has been on is not going to take care of this at all. I think they will get a need to switch him to either Levaquin or Cipro and this was discussed with the patient. 02/26/2019 on evaluation today patient's lower extremity on the left appears to be doing significantly better as compared to last evaluation. Fortunately there is no signs of active infection at this time. He has been tolerating the compression wrap without complication in  fact he made it the whole week at this point. He is showing signs of excellent improvement I am very happy in this regard. With that being said he is having some issues with infection we did review the results of his culture which I noted today. He did have a positive finding for Enterobacter as well as Alcaligenes faecalis. Fortunately the Levaquin that I placed him on will work for both which is great news. There is no signs of systemic infection at this point. 10/30; left posterior leg wound in the setting of very significant edema and what looks like chronic venous inflammation. He has compression pumps but does not use them. We have been using 3 layer compression. Silver alginate to the wound as the primary dressing 03/18/2019 on evaluation today patient appears to be doing a little better compared to last time I saw him. He really has not been using his compression pumps he tells me that he is having too much discomfort. He has been keeping his wraps on however. He is only been taking his fluid pills every other day because he states they are not really helping and he has an appointment with his primary care provider at the [facility] tomorrow. Subsequently the wound itself on the left lower extremity does seem to be greatly improved compared to previous. 03/25/2019 on  evaluation today patient appears to be doing better with regard to his wounds on the bilateral lower extremities. The left is doing excellent the right is also doing better although both still do show some signs of open wounds noted at this point unfortunately. Fortunately there is no signs of active infection at this time. The patient also is not really having any significant pain which is good news. Unfortunately there was some confusion with the referral on vascular disease and as far as getting the patient scheduled there can be contacting him later today to do this [name], [name] ([id number]) fortunately we got this straightened out. 04/01/2019 on evaluation today patient appears to be doing no fevers, chills, nausea, vomiting, or diarrhea. Excellent at this time with regard to his lower extremities. There does not appear to be any open wound at this point which is good news. Fortunately is also no signs of active infection at this time. Overall feel like the patient has done excellent with the compression the problem is every time we got him to this point and then subsequently go to using his own compression things just go right back to where they were. I am not sure how to address this we can try to get an appointment with vascular for 2 weeks now they have yet to call him. Obviously this has become frustrating for the patient as well. I think the issue has just been an honest error as far as scheduling is concerned but nonetheless still worn out the point where I am unsure of which direction we should take. 04/08/2019 on evaluation today patient actually appears to be doing well with regard to his lower extremities. There are no open wounds at this time and things seem to be managing quite nicely as far as the overall edema control is concerned. With that being said he does have his compression socks today for [place] to go ahead and reinitiate therapy in that manner at this point. He is going to be  going for shoes to be measured on Wednesday and then coincidentally he will also be seeing vascular on Thursday. Overall I think this is good news and  again I am hopeful that they will be able to do something for him to help prevent ongoing issues with edema control as well. No fevers, chills, nausea, vomiting, or diarrhea. 04/11/2019 on evaluation today patient actually appears to be doing poorly after just being discharged on Monday of this week. He had been experiencing issues with again blisters especially on the left lower extremity. With that being said he was completely healed and appeared to be doing great this past Monday. He then subsequently has new blisters that formed before his appointment with vascular this morning. He was also measured for shoes in the interim. With that being said we may have figured out what exactly is going on and why he continues to have issues like what we are seeing at this point. He takes his compression stockings off at nighttime and then he ends up having to sleep in his chair for 5-6 hours a night. He sleeps with his feet down he cannot really get him up in the recliner and therefore he is sleeping and the worst possible his position with his feet on the floor for that majority of the time. Again as I explained to him that is about one third at minimum at least one fourth of his day that he spending with his feet dangling down on the ground and the worst possible position they could be. I think this may be what is causing the issue. Subsequently I am leaning toward thinking that he may need a hospital bed in order to elevate his legs. We likely can have to coordinate this with his primary care provider at the [facility]. Readmission: 01/26/2021 this is a patient who presents for repeat evaluation here in the clinic although it is actually been couple of years since have seen him in fact it was December 2020 when I last saw him. Subsequently he never really  healed but did end up being lost to follow-up. He tells me has been having issues ongoing with his lower extremities has bilateral lower extremity lymphedema no real significant or definitive open wounds but in general his lymphedema is way out of control. We were never able to refer him to lymphedema clinic simply due to the fact to be honest we were never able to get him completely healed. I do not see anyone with open wounds. The patient does have evidence of type 2 diabetes mellitus, lymphedema, chronic venous insufficiency, and hypertension. That really has not changed since his last evaluation. 02/09/2021 upon evaluation today patient appears to be doing a little better in regard to his legs although he still having a tremendous amount of drainage especially on the left leg. Fortunately there does not appear to be any evidence of active infection. Of note when we looked into this further it appears that the patient did not have any absorptive dressing on it was just the 4-layer compression wrap. Nonetheless this is probably big part of the issue here. 10/10; he comes in today with 3 large areas on the upper right lower leg likely remanence of denuded blistering under his compression wraps. He has no other wounds on the right. On the left he has the denuded area on the left medial foot and ankle and on the left dorsal foot. Massive lymphedema in both feet dorsally. Using Zetuvit under compression We have increased home health visitation to twice a week to change the dressings and will change it once 02/22/2021 upon evaluation today patient appears to be doing well currently  with regard to his wounds. He has been tolerating the dressing changes without complication. Fortunately there does not appear to be any evidence of active infection which is great news. No fevers, chills, nausea, vomiting, or diarrhea. The biggest issue I see currently is that home health is not putting any medicine on the  actual wounds before wrapping. 03/01/2021 upon evaluation today the patient's right leg actually appears to be doing quite well which is great news there does not appear to be any evidence of active infection at this time. No fevers, chills, nausea, vomiting, or diarrhea. With that being said the patient is having issues on the left foot where he is having significant drainage is also an ammonia smell he does not have any animals at home and this makes me concerned about a bacteria producing urea as a byproduct. Again the possible common organisms will be E. coli, Proteus, and Enterococcus. All 3 of which can be successfully treated with Levaquin. For that reason I think that this may be a good option for [place] to consider placing him on and I did obtain a culture as well for confirmation sake. 03/08/2021 upon evaluation today patient appears to be doing unfortunately still somewhat poorly in regard to his leg ulcerations. He actually has an area on the right leg where he blistered due to the fact that his wrap slid down and caused an area of pinching on his skin and this has led to a significant issue here. 03/15/2021 upon evaluation today patient unfortunately has not been wrapped appropriately with absorptive dressings nor with the appropriate technique for the third layer of the 4-layer compression wrap. These are issues that we continue to try to address with the home health nurse. Also the absorptive dressing that she had was cut in half and therefore that causes things to leak out it does not actually trap the fluid in regard to the top of the foot overall I think that all these combined are really not seeing things improved significantly here. Fortunately there does not appear to be any signs of significant infection at this time which is good news. He still is having a tremendous amount of drainage. 03/22/2021 upon evaluation today patient appears to be draining tremendously. He still continues  to tell me that he is using his pumps 2 times a day and that coupled with that tells me that he is elevating his legs as well. With that being said all things considered I am really just not seeing the improvement we would expect to see with the 4-layer compression wrap and all the above noted. He in fact had an extremely large Zetuvit dressing on both legs and that they were extremely filled to the max with fluid. This is after just being changed just before the weekend and this is Monday. Nonetheless I am concerned about the fact that there is something going on fairly significant that we cannot get any of this under control and that he is draining this significantly. He supposed be having an echocardiogram it sounds like scheduling has been an issue for him as far as getting in sooner. Its something to do with needing his cousin to drive him because of where it sat and he cannot drive himself to this appointment either way I really think he needs to try to see what he can do about making this happen a little sooner. He tells me he will call today. [name], [name] ([id number]) 03/29/2021 on evaluation today patient appears to be  doing about the same in regard to his legs. He did get his cardiology appointment moved up to 6 December which is at least good that is better than what it was before mid December. Overall very pleased in that regard. 04/05/2021 upon evaluation today patient unfortunately is still doing fairly poorly. There does not appear to be any signs of active infection at this time. No fevers, chills, nausea, vomiting, or diarrhea. Unfortunately I think until his edema is under control and overall fluid overload there is really not to be much chance that I can do much to get him better. This is quite unfortunate and frustrating both for myself and the patient to be perfectly honest. Nonetheless I think that he really needs to have a conversation both with his primary care provider as well  as cardiologist he sees the PCP on Monday and cardiology on Wednesday of next week. 04/13/2021 upon evaluation today patient appears to be doing poorly in regard to his bilateral lower extremities his left is still worse than the right. With that being said he has a tremendous amount of drainage he did see his primary care provider yesterday there really was not much there to be done from their perspective. He sees cardiology tomorrow. Nonetheless my biggest concern here is simply that if we do not get the edema under control he is going to continue to have drainage and honestly I think at some point he is going to become infected severely that is my main concern. 04/19/2021 upon evaluation today patient appears to be doing poorly still in regard to his legs. Unfortunately there does not appear to be any signs of infection at this point. He does have a tremendous amount of drainage however. We have not seen the results back from the cardiologist and the echocardiogram that was done. It appears that the patient checked out okay as far as that is concerned with regard to ejection fraction though we still have some issues here to be honest with his diastolic function. I am unsure if this is accounting for everything that we are seeing or not. Either way he has a tremendous amount of drainage from his legs that we are just not able to control in the outpatient setting at this point. I have reached out to Dr. Rockey Situ his cardiologist to see once he reviews the sheet if there is anything that he feels like can be done from an outpatient perspective if not then I think the way to go is probably can to be through inpatient admission and diuresis. Otherwise I am not sure how working to get this under control we tried antibiotics, compression wrapping, and I have told the patient to be elevating his legs I am not sure how much he does of this but either way I think that this is still an ongoing issue  nonetheless. 04/26/2021 upon evaluation today patient appears to be doing poorly in regard to his legs. He is having a tremendous amount of fluid at this point which is quite unfortunate. Its to the point that he may have had at least 5 to 10 pounds of fluid in his dressings this morning when they were removed these were changed this Friday. Subsequently I think he needs to go to the ER for further evaluation and treatment I think is probably can need diuresis possibly even IV antibiotics been on what the blood work looks like but in general I feel like he needs something to get this under control from  an outpatient perspective absent of everywhere I can think of and I cannot get this under control with our traditional measures. I think this is going require more so that we can get him better 12/30; this is a patient with severe bilateral lymphedema. He was hospitalized from 04/26/2021 through 04/29/2021 treated for cellulitis in the setting of lower extremity ulcers and lymphedema. After he left the hospital he is apparently seen for nurse visit our staff contacted cardiology and he has been started on Lasix 40 mg. Apparently his legs have less edema. Lab work from 05/04/2021 showed a BUN of 38 and creatinine of 1.59 these are elevated versus previous where his creatinine seems to have been 1.30 on 12/19 his potassium is 4.3. I believe the lab work is being followed by cardiology We have him in a 4-layer wrap. Xeroform on the leg wounds and sit to fit on the Berry damage skin on the left dorsal foot versus right dorsal foot. He has compression pumps but does not use them. We have apparently not yet ordered him compression stockings 05/17/2021 upon evaluation today patient's legs though better than last time I personally saw him appear to be getting worse compared to where they were previous. Dr. Quentin Cornwall was actually last 1 to see you I have not seen him since 19 December. That was before he went into  the ER. Coming out apparently his legs looked also and they still look better but not as good as they were in the past. 1/16; patient with severe bilateral lymphedema. Severe scaled hyperkeratotic skin on the dorsal aspect of his distal left foot and left medial ankle.. On the right side changes are not as bad. He did not have any weeping edema. Our intake nurse was convinced that he is being compliant with compression pumps 1 hour twice a day 05/31/2021 upon evaluation today patient actually appears to be doing a little bit better in my opinion in regard to his feet. I do not see as much drainage and it being just completely wet as it was previous. Fortunately I do not also see any signs of active infection which is great news as well. 06/07/21 Upon inspection patient's wound bed actually showed signs of doing well he is not nearly as weepy and wet as he has been in the past and overall very pleased in that regard. Fortunately I do not see any signs of active infection locally or nor systemically at this time. Which is great news. No fevers, chills, nausea, vomiting, or diarrhea. 06/14/2021 upon evaluation today patient appears to be doing well with regard to his right foot I am pleased in that regard. His left foot is still draining quite a bit despite using lymphedema pumps, 4-layer compression wraps, and he tells me elevating his legs as well. He also has Lasix that he takes twice a day. Nonetheless I believe that this is still good to be an ongoing issue. We have a hard time getting this under control as far as the swelling is concerned. 06/21/2021 upon evaluation today patient appears to be doing decently well in regard to his wounds all things considered. He still has a tremendous amount of drainage and fluid noted at this point. Fortunately I do not see any signs of active infection locally or systemically at this point which is great news. Nonetheless I am unsure where to go and how to do this  as far as trying to limit his swelling and weeping from his toes in particular.  06/28/2021 upon evaluation patient unfortunately continues to have significant drainage from his feet. We have been keeping him in a compression wrap and despite this he still continues to have extreme fluid issues he seen his cardiologist he is seeing the nephrologist. We really cannot find any way to get this under control when he did well was when he was in the hospital and they got some of the fluid away. But outside of that we are just struggling to achieve the long-term goal of getting this under control and keeping it under control unfortunately. 07/05/2021 upon evaluation today patient appears to be doing poorly in regard to his feet. Unfortunately this continues to be a significant issue and to be honest I am really not certain what to do about it. I referred him to Dr. Randol Kern at [facility] and he does have an appointment although it is 12 April. He also sees his primary care provider on 6 April. He did not want to see Dr. Haynes Kerns until after he saw his PCP that is the reason the appointment so far out. There is really not much I can do in that regard. Nonetheless I do think that we are still continue to have significant lymphedema issues with significant mount of weeping in regard to the feet and again this has just become extremely difficult to manage to be honest I am not sure if there is something else that Dr. Haynes Kerns or someone else could recommend he also will be seeing Dr. Dellia Nims in 2 weeks when I am on vacation and at that time I will see if Dr. Dellia Nims has any ideas about where to go from here in the meantime. [name], [name] ([id number]) 07/12/2021 upon evaluation today patient appears to be doing about as well as can be expected with regard to his feet. He does actually see his kidney doctor this Friday. He also will be seeing his primary care provider on April 4 and then following that around mid April he will be  seeing Dr. Haynes Kerns at [facility] which was a referral made for him. Again my goal is to try to find out some way to fix this and to be perfectly honest we have had some issues with making any good adjustments. When he was in the hospital and greater amounts of Lasix he was able to get this down and it looked much better upon discharge. With that being said right now things just are not doing nearly as good as what they used to be. 3/13; patient presents for follow-up. He has been using his lymphedema pumps over the past week. He reports an increase in his Lasix dose. He has no issues or complaints today. He denies signs of infection. 07/26/2021 upon evaluation today patient appears to be doing better in regard to his feet bilaterally. Both are showing signs of much less drainage which is great news and overall very pleased in that regard. Fortunately there does not appear to be any evidence of active infection locally or systemically at this time. No fevers, chills, nausea, vomiting, or diarrhea. 08/02/2021 upon evaluation today patient appears to be doing well with regard to his feet. Both are showing signs of being drier the right pretty much has not really draining much at all which is great news. The left is not draining anywhere close to his much as it was during the last evaluation. This is excellent news and overall very pleased. 08/09/2021 upon evaluation today patient appears to be doing well with regard to  his legs the right leg especially showing signs of excellent improvement which is great news I do not see any evidence of active infection locally or systemically which is great. In regard to the left leg he still has some weeping and drainage but nothing as significant as what it was in the past this is great news. 08-16-2021 upon evaluation today patient appears to actually be doing quite well in my opinion in regard to his feet. This is significantly improved compared to what we previously seen  and overall I am extremely pleased in that regard. I do believe that He is actually improving although this is obviously very slow going. 08-23-2021 upon evaluation today patient appears to be doing well currently in regard to his right leg which actually is pretty dry at this point today. Fortunately I do not see any evidence of active infection at this time which is great news. No fevers, chills, nausea, vomiting, or diarrhea. 08-30-2021 upon evaluation today patient appears to be doing well with regard to his lower extremities. The right foot is pretty much completely dry which is great news the left foot though not completely dry seems to be doing decently well. I do not see any signs of active infection locally or systemically which is great news. No fevers, chills, nausea, vomiting, or diarrhea. 09-06-2021 upon evaluation today patient appears to be doing well with regard to his feet in fact now the left foot is almost completely dry as well and I am definitely seeing a lot of significant improvement. With that being said unfortunately he actually appears to have some cellulitis of his right thigh. His toes are also little bit red but this may just be due to the increased swelling. He really is not warm to touch in regard to the toes. 5/8; excellent edema control on the right foot and lower leg there is no open wounds but we continue to put compression on this otherwise this will breakdown. He is using his compression pumps twice a day at home The area that is problematic is on the left dorsal foot some areas that are not fully epithelialized with very dry fissured skin over this area. 09-20-2021 upon evaluation today patient appears to be doing a little bit worse in regard to swelling at this time. Fortunately I do not see any signs of infection with that being said the wrap was not on quite as well as what I would like to have seen. I do believe that this has caused a little bit of excess swelling  and again we need to try to get this under good control. 09-27-2021 upon evaluation today patient appears to be doing awesome in regard to his feet and legs. Everything is measuring smaller the swelling is down and to be perfectly honest I am extremely pleased with the end of his feet especially on the left side and how dry this is today. I do think we are on the right track here. 5/31; patient presents for follow-up. He is using nystatin powder to the feet bilaterally under 4-layer compression. He has no issues or complaints today. He states he is going to the lymphedema clinic tomorrow. Since he has no open wounds on the right lower extremity they will be focused on the side. 10-11-2021 upon evaluation today patient appears to be doing excellent in regard to his legs. He has been tolerating the dressing changes without complication. Fortunately there does not appear to be any evidence of active infection locally or  systemically at this time which is great news. No fevers, chills, nausea, vomiting, or diarrhea. 10-18-2021 upon evaluation today patient appears to be doing well with regard to his legs. He has been tolerating the dressing changes without complication and actually seen in lymphedema clinic now in regard to his right leg were taken care of the left leg. Fortunately I do not see any evidence of active infection locally or systemically at this time. 10-26-2021 upon evaluation today patient's wounds actually are showing signs of doing well in fact he really does not have wounds as much is weeping of the lower extremities. Fortunately I do not see any evidence of active infection locally or systemically which is great news. No fever or chills noted 11-02-2021 upon evaluation today patient appears to be doing decently well in regard to his legs. Of actually been on the phone quite a bit discussing with [facility] custom compression for him. I also have been discussing everything with his  lymphedema clinic provider as well. Helene Kelp notes that there is really not much more that she can do for him which was noted last week as well. Subsequently I do believe that the patient would benefit from getting custom compression in fact I think that is what he is going require to keep anything under control here. He voiced understanding. 11-08-2021 upon evaluation today patient appears to be doing excellent in regard to his legs and feet. Fortunately I do not see any signs of infection at this time which is great news. No fever or chills noted again I am still been working on trying to figure out what exactly we need to do as far as getting the [place] involved in coverage for custom compression for this patient. I also discussed with him that he does need to have custom shoes which I completely understand. With that being said it does make it difficult thinking about what kind of combination of shoes and compression is going to be best for him for the long-term. Fortunately I do not see any evidence of active infection locally or systemically which is great news. Overall I think you are doing quite well. 11-16-2021 upon evaluation today patient appears to be doing well currently in regard to his legs and feet. Everything is significantly smaller [name], [name] ([id number]) compared to what has been. Fortunately I do not see any evidence of active infection locally or systemically at this time which is great news. 11-23-2021 upon evaluation today patient appears to be doing well with regard to his legs bilaterally. We can actually initiate treatment with a recommendation today to utilize Tubigrip and try to see if we can get this under better control. The patient is in agreement with the plan. Nonetheless the big question is whether this will be something that he can utilize at home in order to keep his swelling down and allow him to be able to function without having to come into the clinic as frequently.  He notes that he would love to get to this point. He does have a cousin who is willing to help him with change out the Tubigrip when necessary. 7/25; the patient does not have any open wounds however his left leg is a lot more swollen than it was last week. His cousin came in who presumably would be the one to change his juxta lites to practice on doing this for the patient. She apparently lives in the [place] block. In the meantime he has been using Tubigrip 12-07-2021 upon  evaluation today patient appears to be doing well with regard to his legs although we are transitioning to try to get him to use the Tubigrip which again has been a little bit of a setback but not too much overall I think he is still doing well he still able to wear his current shoes which is good news. He does have a cousin who is willing to help him with the Tubigrip and we are training her how to do this as well. She is here today. 12-14-2021 upon evaluation today patient appears to be doing well currently in regard to his right leg which I think is doing excellent with the Tubigrip. With regard to the left leg unfortunately this is not doing nearly as well and will get me to do something to intervene here. I think getting back into a 4-layer wraps can be the way to go short-term To get this back under control and the patient voiced understanding. 12-21-2021 upon evaluation today patient appears to be tolerating the dressing changes currently without complication the wrap of the left leg is doing well the right leg reason Tubigrip. The wound is looking significantly improved. Electronic Signature(s) Signed: 12/21/2021 11:52:29 AM By: Worthy Keeler PA-C Entered By: Worthy Keeler on 12/21/2021 11:52:28 [name] ([id number]) -------------------------------------------------------------------------------- Physical Exam Details Patient Name: [name] Date of Service: 12/21/2021 11:00 AM Medical Record Number:  [id number] Patient Account Number: [id number] Date of Birth/Sex: [birth year] (71 y.o. M) Treating RN: [name] Primary Care Provider: Fontaine No Other Clinician: Referring Provider: Fontaine No Treating Provider/Extender: Skipper Cliche in Treatment: 74 Constitutional Well-nourished and well-hydrated in no acute distress. Respiratory normal breathing without difficulty. Psychiatric this patient is able to make decisions and demonstrates good insight into disease process. Alert and Oriented x 3. pleasant and cooperative. Notes Upon inspection patient's wound bed actually showed signs of good granulation and epithelization at this point. Fortunately there does not appear to be any evidence of active infection locally or systemically which is great news and overall very pleased with regard to what I am seeing today. Electronic Signature(s) Signed: 12/21/2021 11:52:44 AM By: Worthy Keeler PA-C Entered By: Worthy Keeler on 12/21/2021 11:52:44 [name] ([id number]) -------------------------------------------------------------------------------- Physician Orders Details Patient Name: [name] Date of Service: 12/21/2021 11:00 AM Medical Record Number: [id number] Patient Account Number: [id number] Date of Birth/Sex: [birth year] (71 y.o. M) Treating RN: [name] Primary Care Provider: Fontaine No Other Clinician: Referring Provider: Fontaine No Treating Provider/Extender: Skipper Cliche in Treatment: 12 Verbal / Phone Orders: No Diagnosis Coding ICD-10 Coding Code Description E11.622 Type 2 diabetes mellitus with other skin ulcer I89.0 Lymphedema, not elsewhere classified I87.2 Venous insufficiency (chronic) (peripheral) L97.822 Non-pressure chronic ulcer of other part of left lower leg with fat layer exposed L97.512 Non-pressure chronic ulcer of other part of right foot with fat layer exposed I10 Essential (primary)  hypertension L97.811 Non-pressure chronic ulcer of other part of right lower leg limited to breakdown of skin Follow-up Appointments o Return Appointment in 1 week. o Nurse Visit as needed Bathing/ Shower/ Hygiene o May shower with wound dressing protected with water repellent cover or cast protector. o No tub bath. Anesthetic (Use 'Patient Medications' Section for Anesthetic Order Entry) o Lidocaine applied to wound bed Edema Control - Lymphedema / Segmental Compressive Device / Other Bilateral Lower Extremities o 4 Layer Compression System Lymphedema. - left side with nystatin between toes and silver cell to any weeping areas with tubi c double layer  to ankle/foot o Tubigrip double layer applied - Nystatin powder between toes double size d bilat just behind toes to patella knotch , double size C foot ankles enclose toes to just above ankle scell to any weeping areas o Elevate, Exercise Daily and Avoid Standing for Long Periods of Time. o Elevate legs to the level of the heart and pump ankles as often as possible o Elevate leg(s) parallel to the floor when sitting. o Compression Pump: Use compression pump on left lower extremity for 60 minutes, twice daily. - 2 times per day o DO YOUR BEST to sleep in the bed at night. DO NOT sleep in your recliner. Long hours of sitting in a recliner leads to swelling of the legs and/or potential wounds on your backside. o Other: - Contact prescriber regarding use of diuretics to reduce fluid overload. Off-Loading o Turn and reposition every 2 hours Additional Orders / Instructions o Follow Nutritious Diet and Increase Protein Intake Medications-Please add to medication list. o P.O. Antibiotics Electronic Signature(s) Signed: 12/23/2021 5:21:19 PM By: Worthy Keeler PA-C Signed: 12/24/2021 6:13:50 PM By: [name] RN Entered By: [name] on 12/21/2021 11:31:54 [name]  ([id number]) -------------------------------------------------------------------------------- Problem List Details Patient Name: [name] Date of Service: 12/21/2021 11:00 AM Medical Record Number: [id number] Patient Account Number: [id number] Date of Birth/Sex: 1950/10/28 (71 y.o. M) Treating RN: [name] Primary Care Provider: Fontaine No Other Clinician: Referring Provider: Fontaine No Treating Provider/Extender: Skipper Cliche in Treatment: 46 Active Problems ICD-10 Encounter Code Description Active Date MDM Diagnosis E11.622 Type 2 diabetes mellitus with other skin ulcer 01/26/2021 No Yes I89.0 Lymphedema, not elsewhere classified 01/26/2021 No Yes I87.2 Venous insufficiency (chronic) (peripheral) 01/26/2021 No Yes L97.822 Non-pressure chronic ulcer of other part of left lower leg with fat layer 01/26/2021 No Yes exposed L97.512 Non-pressure chronic ulcer of other part of right foot with fat layer 01/26/2021 No Yes exposed [place] (primary) hypertension 01/26/2021 No Yes L97.811 Non-pressure chronic ulcer of other part of right lower leg limited to 02/15/2021 No Yes breakdown of skin Inactive Problems Resolved Problems Electronic Signature(s) Signed: 12/21/2021 10:55:10 AM By: Worthy Keeler PA-C Entered By: Worthy Keeler on 12/21/2021 10:55:09 [name] ([id number]) -------------------------------------------------------------------------------- Progress Note Details Patient Name: [name] Date of Service: 12/21/2021 11:00 AM Medical Record Number: [id number] Patient Account Number: [id number] Date of Birth/Sex: [birth year] (71 y.o. M) Treating RN: [name] Primary Care Provider: Fontaine No Other Clinician: Referring Provider: Fontaine No Treating Provider/Extender: Skipper Cliche in Treatment: 65 Subjective Chief Complaint Information obtained from Patient Left LE ulcers History of Present Illness  (HPI) 71 year old male who presented to the ER with bilateral lower extremity blisters which had started last week. he has a past medical history of leukemia, diabetes mellitus, hypertension, edema of both lower extremities, his recurrent skin infections, peripheral vascular disease, coronary artery disease, congestive heart failure and peripheral neuropathy. in the ER he was given Rocephin and put on Silvadene cream. he was put on oral doxycycline and was asked to follow-up with the [facility]. His last hemoglobin A1c was 6.6 in December and he checks his blood sugar once a week. He does not have any physicians outside the [place] system. He does not recall any vascular duplex studies done either for arterial or venous disease but was told to wear compression stockings which he does not use 05/30/2016 -- we have not yet received any of his notes from the [facility] hospital system and his arterial and venous duplex  studies are scheduled here in [place] around mid February. We are unable to have his insurance accepted by home health agencies and hence he is getting dressings only once a week. 06/06/16 -- -- I received a call from the patient's PCP at the [facility] and spoke to Dr. Garvin Fila, phone number [phone] and fax number [phone]. She confirmed that no vascular testing was done over the last 5 years and she would be happy to do them if the patient did want them to be done at the [place] and we could fax him a request. Readmission: 71 year old male seen by as in February of this year and was referred to vein and vascular for studies and opinion from the vascular surgeons. The patient returns today with a fresh problem having had blisters on his left lower extremity which have been there for about 5 days and he clearly states that he has been wearing his compression stockings as advised though he could not read the moderate compression and has been wearing light compression. Review of his  electronic medical records note that he had lower extremity arterial duplex examination done on 06/23/2016 which showed no hemodynamically significant stenosis in the bilateral lower extremity arterial system. He also had a lower extremity venous reflux examination done on 07/07/2016 and it was noted that he had venous incompetence in the right great saphenous vein and bilateral common femoral veins. Patient was seen by Dr. Tamala Julian on the same day and for some reason his notes do not reflect the venous studies or the arterial studies and he recommended patient do a venous duplex ultrasound to look for reflux and return to see him.he would also consider a lymph pump if required. The patient was told that his workup was normal and hence the patient canceled his follow-up appointment. 02/03/17 on evaluation today patient left medial lower extremity blister appears to be doing about the same. It is still continuing to drain and there's still the blistered skin covering the wound bed which is making it difficult for the alternate to do its job. Fortunately there is no evidence of cellulitis. No fevers chills noted. Patient states in general he is not having any significant discomfort. Patient's lower extremity arterial duplex exam revealed that patient was hemodynamically stable with no evidence of stenosis in regard to the bilateral lower extremities. The lower extremity venous reflux exam revealed the patient had venous incontinence noted in the right greater saphenous and bilateral common femoral vein. There is no evidence of deep or superficial vein thrombosis in the bilateral lower extremities. Readmission: 11/12/18 Patient presents for evaluation our clinic today concerning issues that he is having with his left lower extremity. He tells me that a couple weeks ago he began developing blisters on the left lower extremity along with increased swelling. He typically wears his compression stockings on a  regular basis is previously been evaluated both here as well is with vascular surgery they would recommend lymphedema pumps but unfortunately that somehow fell through and he never heard anything back from that. Nonetheless I think lymphedema pumps would be beneficial for this patient. He does have a history of hypertension and diabetes. Obviously the chronic venous stasis and lymphedema as well. At this point the blisters have been given in more trouble he states sometimes when the blisters openings able to clean it down with alcohol and it will dry out and do well. Unfortunately that has not been the case this time. He is having some discomfort although  this mean these with cleaning the areas he doesn't have discomfort just on a regular basis. He has not been able to wear his compression stockings since the blisters arose due to the fact that of course it will drain into the socks causing additional issues and he didn't have any way to wrap this otherwise. He has increased to taking his Lasix every day instead of every other day. He sees his primary care provider later this month as well. No fevers, chills, nausea, or vomiting noted at this time. 11/19/18-Patient returns at 1 week, per intake RN the amount of seepage into the compression wraps was definitely improved, overall all the wounds are measuring smaller but continuing silver alginate to the wounds as primary dressing 11/26/18 on evaluation today patient appears to be doing quite well in regard to his left lower [place] ulcers. In fact of the areas that were noted initially he only has two regions still open. There is no evidence of active infection at this time. He still is not heard anything from the company regarding lymphedema pumps as of yet. Again as previously seen vascular they have not recommended any surgical intervention. [name], [name] ([id number]) 12/03/2018 on evaluation today patient actually appears to be doing quite well with  regard to his lower extremity ulcers. In fact most of the areas appear to be healed the one spot which does not seem to be completely healed I am unsure of whether or not this is really draining that much but nonetheless there does not appear to be any signs of infection or significant drainage at this point. There is no sign of fever, chills, nausea, vomiting, or diarrhea. Overall I am pleased with how things have progressed I think is very close to being able to transition to his home compression stockings. 12/10/2018 upon evaluation today patient appears to be doing quite well with regard to his left lower extremity. He has been tolerating the dressing changes without complication. Fortunately there is no signs of active infection at this time. He appears after thorough evaluation of his leg to only have 1 small area that remains open at this point everything else appears to be almost completely closed. He still have significant swelling of the left lower extremity. We had discussed discussing this with his primary care provider he is not able to see her in person they were at the [facility] and right now the [place] is not seeing patients on site. According to the patient anyway. Subsequently he did speak with her apparently and his primary care provider feels that he may likely have a DVT. With that being said she has not seen his leg she is just going off of his history. Nonetheless that is a concern that the patient now has as well and while I do not feel the DVT is likely we can definitely ensure that that is not the case I will go ahead and see about putting that order in today. Nonetheless otherwise I am in a recommend that we continue with the current wound care measures including the compression therapy most likely. We just need to ensure that his leg is indeed free of any DVTs. 12/17/2018 on evaluation today patient actually appears to be completely healed today. He does have 2 very small areas  of blistering although this is not anything too significant at this point which is good news. With that being said I am in agreement with the fact that I think he is completely healed at this  point. He does want to get back into his compression stocking. The good news is we have gotten approval from insurance for his lymphedema pumps we received a letter since last saw him last week. The other good news is his study did come back and showed no evidence of a DVT. 12/20/2018 on evaluation today patient presents for follow-up concerning his ongoing issues with his left lower extremity. He was actually discharged last Friday and did fairly well until he states blisters opened this morning. He tells me he has been wearing his compression stocking although he has a hard time getting this on. There does not appear to be any signs of active infection at this time. No fevers, chills, nausea, vomiting, or diarrhea. 12/27/2018 on evaluation today patient appears to be doing very well with regard to his swelling of the left lower extremity the 4 layer compression wrap seems to have been beneficial for him. Fortunately there is no signs of active infection at this time. Patient has been tolerating the compression wrap without complication and his foot swelling in particular appears to be greatly improved. He does still have a wound on the lateral portion of his left leg I believe this is more of a blister that has now reopened. 01/03/2019 on evaluation today patient actually appears to be doing excellent in regard to his left lower extremity. He did receive his compression pumps and is actually use this 7 times since he was last here in the office. On top of the compression wrap he is now roughly 3 cm better at the calf and 2 cm better at the ankle he also states that his foot seem to go an issue better without even having to use a shoe horn. Obviously I think this is all evidence that he is doing excellent in this  regard. The other good news is he does not appear to have anything open today as far as wounds are concerned. 01/15/2019 on evaluation today patient appears to be doing more poorly yet again with regard to his left lower extremity. He has developed new wounds again after being discharged just recently. Unfortunately this continues to be the case that he will heal and then have subsequent new wounds. The last time I was hopeful that he may not end up coming back too quickly especially since he states he has been using his lymphedema pumps along with wearing his compression. Nonetheless he had a blister on the back of his leg that popped up on the left and this has opened up into an ulceration it is quite painful. 01/22/19 on evaluation today patient actually appears to be doing well with regard to his wound on the left lower extremity. He's been tolerating the dressing changes without complication including the compression wrap in the wound appears to be significantly smaller today which is great news. Overall very pleased in this regard. 01/29/2019 on evaluation today patient appears to be doing well with regard to his left posterior lower extremity ulcer. He has been tolerating the dressing changes without complication. This is not completely healed but is getting much closer. We did order a Farrow wrap 4000 for him he has received this and has it with him today although I am not sure we are quite ready to start him on that as of yet. We are very close. 02/05/2019 on evaluation today patient actually appears to be doing quite well with regard to his left posterior lower extremity ulcer. He still has a very tiny  opening remaining but the fortunate thing is he seems to be healing quite nicely. He also did get his Farrow wrap which I am hoping will help with his edema control as well at home. Fortunately there is no evidence of active infection. 02/12/2019 patient and fortunately appears to be doing poorly  in regard to his wounds of the left lower extremity. He was very close to healing therefore we attempted to use his Velcro compression wraps continuing with lymphedema pumps at home. Unfortunately that does not seem to have done very well for him. He tells me that he wore them all the time but again I am not sure why if that is the case that he is having such significant edema. He is still on his fluid pills as well. With that being said there is no obvious sign of infection although I do wonder about the possibility of infection at this time as well. 02/19/2019 unfortunately upon evaluation today patient appears to be doing more poorly with regard to his left lower extremity. He is not showing signs of significant improvement and I think the biggest issue here is that he does have an infection that appears to likely be Pseudomonas. That is based on the blue-green drainage that were noted at this time. Unfortunately the antibiotic that has been on is not going to take care of this at all. I think they will get a need to switch him to either Levaquin or Cipro and this was discussed with the patient. 02/26/2019 on evaluation today patient's lower extremity on the left appears to be doing significantly better as compared to last evaluation. Fortunately there is no signs of active infection at this time. He has been tolerating the compression wrap without complication in fact he made it the whole week at this point. He is showing signs of excellent improvement I am very happy in this regard. With that being said he is having some issues with infection we did review the results of his culture which I noted today. He did have a positive finding for Enterobacter as well as Alcaligenes faecalis. Fortunately the Levaquin that I placed him on will work for both which is great news. There is no signs of systemic infection at this point. 10/30; left posterior leg wound in the setting of very significant edema and  what looks like chronic venous inflammation. He has compression pumps but does not use them. We have been using 3 layer compression. Silver alginate to the wound as the primary dressing 03/18/2019 on evaluation today patient appears to be doing a little better compared to last time I saw him. He really has not been using his compression pumps he tells me that he is having too much discomfort. He has been keeping his wraps on however. He is only been taking his fluid pills every other day because he states they are not really helping and he has an appointment with his primary care provider at the [facility] tomorrow. Subsequently the wound itself on the left lower extremity does seem to be greatly improved compared to previous. [name], [name] ([id number]) 03/25/2019 on evaluation today patient appears to be doing better with regard to his wounds on the bilateral lower extremities. The left is doing excellent the right is also doing better although both still do show some signs of open wounds noted at this point unfortunately. Fortunately there is no signs of active infection at this time. The patient also is not really having any significant pain which is  good news. Unfortunately there was some confusion with the referral on vascular disease and as far as getting the patient scheduled there can be contacting him later today to do this fortunately we got this straightened out. 04/01/2019 on evaluation today patient appears to be doing no fevers, chills, nausea, vomiting, or diarrhea. Excellent at this time with regard to his lower extremities. There does not appear to be any open wound at this point which is good news. Fortunately is also no signs of active infection at this time. Overall feel like the patient has done excellent with the compression the problem is every time we got him to this point and then subsequently go to using his own compression things just go right back to where they were. I am not sure  how to address this we can try to get an appointment with vascular for 2 weeks now they have yet to call him. Obviously this has become frustrating for the patient as well. I think the issue has just been an honest error as far as scheduling is concerned but nonetheless still worn out the point where I am unsure of which direction we should take. 04/08/2019 on evaluation today patient actually appears to be doing well with regard to his lower extremities. There are no open wounds at this time and things seem to be managing quite nicely as far as the overall edema control is concerned. With that being said he does have his compression socks today for [place] to go ahead and reinitiate therapy in that manner at this point. He is going to be going for shoes to be measured on Wednesday and then coincidentally he will also be seeing vascular on Thursday. Overall I think this is good news and again I am hopeful that they will be able to do something for him to help prevent ongoing issues with edema control as well. No fevers, chills, nausea, vomiting, or diarrhea. 04/11/2019 on evaluation today patient actually appears to be doing poorly after just being discharged on Monday of this week. He had been experiencing issues with again blisters especially on the left lower extremity. With that being said he was completely healed and appeared to be doing great this past Monday. He then subsequently has new blisters that formed before his appointment with vascular this morning. He was also measured for shoes in the interim. With that being said we may have figured out what exactly is going on and why he continues to have issues like what we are seeing at this point. He takes his compression stockings off at nighttime and then he ends up having to sleep in his chair for 5-6 hours a night. He sleeps with his feet down he cannot really get him up in the recliner and therefore he is sleeping and the worst possible  his position with his feet on the floor for that majority of the time. Again as I explained to him that is about one third at minimum at least one fourth of his day that he spending with his feet dangling down on the ground and the worst possible position they could be. I think this may be what is causing the issue. Subsequently I am leaning toward thinking that he may need a hospital bed in order to elevate his legs. We likely can have to coordinate this with his primary care provider at the [facility]. Readmission: 01/26/2021 this is a patient who presents for repeat evaluation here in the clinic although it is  actually been couple of years since have seen him in fact it was December 2020 when I last saw him. Subsequently he never really healed but did end up being lost to follow-up. He tells me has been having issues ongoing with his lower extremities has bilateral lower extremity lymphedema no real significant or definitive open wounds but in general his lymphedema is way out of control. We were never able to refer him to lymphedema clinic simply due to the fact to be honest we were never able to get him completely healed. I do not see anyone with open wounds. The patient does have evidence of type 2 diabetes mellitus, lymphedema, chronic venous insufficiency, and hypertension. That really has not changed since his last evaluation. 02/09/2021 upon evaluation today patient appears to be doing a little better in regard to his legs although he still having a tremendous amount of drainage especially on the left leg. Fortunately there does not appear to be any evidence of active infection. Of note when we looked into this further it appears that the patient did not have any absorptive dressing on it was just the 4-layer compression wrap. Nonetheless this is probably big part of the issue here. 10/10; he comes in today with 3 large areas on the upper right lower leg likely remanence of denuded  blistering under his compression wraps. He has no other wounds on the right. On the left he has the denuded area on the left medial foot and ankle and on the left dorsal foot. Massive lymphedema in both feet dorsally. Using Zetuvit under compression We have increased home health visitation to twice a week to change the dressings and will change it once 02/22/2021 upon evaluation today patient appears to be doing well currently with regard to his wounds. He has been tolerating the dressing changes without complication. Fortunately there does not appear to be any evidence of active infection which is great news. No fevers, chills, nausea, vomiting, or diarrhea. The biggest issue I see currently is that home health is not putting any medicine on the actual wounds before wrapping. 03/01/2021 upon evaluation today the patient's right leg actually appears to be doing quite well which is great news there does not appear to be any evidence of active infection at this time. No fevers, chills, nausea, vomiting, or diarrhea. With that being said the patient is having issues on the left foot where he is having significant drainage is also an ammonia smell he does not have any animals at home and this makes me concerned about a bacteria producing urea as a byproduct. Again the possible common organisms will be E. coli, Proteus, and Enterococcus. All 3 of which can be successfully treated with Levaquin. For that reason I think that this may be a good option for [place] to consider placing him on and I did obtain a culture as well for confirmation sake. 03/08/2021 upon evaluation today patient appears to be doing unfortunately still somewhat poorly in regard to his leg ulcerations. He actually has an area on the right leg where he blistered due to the fact that his wrap slid down and caused an area of pinching on his skin and this has led to a significant issue here. 03/15/2021 upon evaluation today patient  unfortunately has not been wrapped appropriately with absorptive dressings nor with the appropriate technique for the third layer of the 4-layer compression wrap. These are issues that we continue to try to address with the home health nurse. Also the  absorptive dressing that she had was cut in half and therefore that causes things to leak out it does not actually trap the fluid in regard to the top of the foot overall I think that all these combined are really not seeing things improved significantly here. Fortunately there does not appear to be any signs of significant infection at this time which is good news. He still is having a tremendous amount of drainage. 03/22/2021 upon evaluation today patient appears to be draining tremendously. He still continues to tell me that he is using his pumps 2 times a day and that coupled with that tells me that he is elevating his legs as well. With that being said all things considered I am really just not seeing the improvement we would expect to see with the 4-layer compression wrap and all the above noted. He in fact had an extremely large Zetuvit dressing on both legs and that they were extremely filled to the max with fluid. This is after just being changed just before the weekend and this [name], [name] ([id number]) is Monday. Nonetheless I am concerned about the fact that there is something going on fairly significant that we cannot get any of this under control and that he is draining this significantly. He supposed be having an echocardiogram it sounds like scheduling has been an issue for him as far as getting in sooner. Its something to do with needing his cousin to drive him because of where it sat and he cannot drive himself to this appointment either way I really think he needs to try to see what he can do about making this happen a little sooner. He tells me he will call today. 03/29/2021 on evaluation today patient appears to be doing about the  same in regard to his legs. He did get his cardiology appointment moved up to 6 December which is at least good that is better than what it was before mid December. Overall very pleased in that regard. 04/05/2021 upon evaluation today patient unfortunately is still doing fairly poorly. There does not appear to be any signs of active infection at this time. No fevers, chills, nausea, vomiting, or diarrhea. Unfortunately I think until his edema is under control and overall fluid overload there is really not to be much chance that I can do much to get him better. This is quite unfortunate and frustrating both for myself and the patient to be perfectly honest. Nonetheless I think that he really needs to have a conversation both with his primary care provider as well as cardiologist he sees the PCP on Monday and cardiology on Wednesday of next week. 04/13/2021 upon evaluation today patient appears to be doing poorly in regard to his bilateral lower extremities his left is still worse than the right. With that being said he has a tremendous amount of drainage he did see his primary care provider yesterday there really was not much there to be done from their perspective. He sees cardiology tomorrow. Nonetheless my biggest concern here is simply that if we do not get the edema under control he is going to continue to have drainage and honestly I think at some point he is going to become infected severely that is my main concern. 04/19/2021 upon evaluation today patient appears to be doing poorly still in regard to his legs. Unfortunately there does not appear to be any signs of infection at this point. He does have a tremendous amount of drainage however. We have not  seen the results back from the cardiologist and the echocardiogram that was done. It appears that the patient checked out okay as far as that is concerned with regard to ejection fraction though we still have some issues here to be honest with  his diastolic function. I am unsure if this is accounting for everything that we are seeing or not. Either way he has a tremendous amount of drainage from his legs that we are just not able to control in the outpatient setting at this point. I have reached out to Dr. Rockey Situ his cardiologist to see once he reviews the sheet if there is anything that he feels like can be done from an outpatient perspective if not then I think the way to go is probably can to be through inpatient admission and diuresis. Otherwise I am not sure how working to get this under control we tried antibiotics, compression wrapping, and I have told the patient to be elevating his legs I am not sure how much he does of this but either way I think that this is still an ongoing issue nonetheless. 04/26/2021 upon evaluation today patient appears to be doing poorly in regard to his legs. He is having a tremendous amount of fluid at this point which is quite unfortunate. Its to the point that he may have had at least 5 to 10 pounds of fluid in his dressings this morning when they were removed these were changed this Friday. Subsequently I think he needs to go to the ER for further evaluation and treatment I think is probably can need diuresis possibly even IV antibiotics been on what the blood work looks like but in general I feel like he needs something to get this under control from an outpatient perspective absent of everywhere I can think of and I cannot get this under control with our traditional measures. I think this is going require more so that we can get him better 12/30; this is a patient with severe bilateral lymphedema. He was hospitalized from 04/26/2021 through 04/29/2021 treated for cellulitis in the setting of lower extremity ulcers and lymphedema. After he left the hospital he is apparently seen for nurse visit our staff contacted cardiology and he has been started on Lasix 40 mg. Apparently his legs have less edema.  Lab work from 05/04/2021 showed a BUN of 38 and creatinine of 1.59 these are elevated versus previous where his creatinine seems to have been 1.30 on 12/19 his potassium is 4.3. I believe the lab work is being followed by cardiology We have him in a 4-layer wrap. Xeroform on the leg wounds and sit to fit on the Berry damage skin on the left dorsal foot versus right dorsal foot. He has compression pumps but does not use them. We have apparently not yet ordered him compression stockings 05/17/2021 upon evaluation today patient's legs though better than last time I personally saw him appear to be getting worse compared to where they were previous. Dr. Quentin Cornwall was actually last 1 to see you I have not seen him since 19 December. That was before he went into the ER. Coming out apparently his legs looked also and they still look better but not as good as they were in the past. 1/16; patient with severe bilateral lymphedema. Severe scaled hyperkeratotic skin on the dorsal aspect of his distal left foot and left medial ankle.. On the right side changes are not as bad. He did not have any weeping edema. Our intake nurse  was convinced that he is being compliant with compression pumps 1 hour twice a day 05/31/2021 upon evaluation today patient actually appears to be doing a little bit better in my opinion in regard to his feet. I do not see as much drainage and it being just completely wet as it was previous. Fortunately I do not also see any signs of active infection which is great news as well. 06/07/21 Upon inspection patient's wound bed actually showed signs of doing well he is not nearly as weepy and wet as he has been in the past and overall very pleased in that regard. Fortunately I do not see any signs of active infection locally or nor systemically at this time. Which is great news. No fevers, chills, nausea, vomiting, or diarrhea. 06/14/2021 upon evaluation today patient appears to be doing well with  regard to his right foot I am pleased in that regard. His left foot is still draining quite a bit despite using lymphedema pumps, 4-layer compression wraps, and he tells me elevating his legs as well. He also has Lasix that he takes twice a day. Nonetheless I believe that this is still good to be an ongoing issue. We have a hard time getting this under control as far as the swelling is concerned. 06/21/2021 upon evaluation today patient appears to be doing decently well in regard to his wounds all things considered. He still has a tremendous amount of drainage and fluid noted at this point. Fortunately I do not see any signs of active infection locally or systemically at this point which is great news. Nonetheless I am unsure where to go and how to do this as far as trying to limit his swelling and weeping from his toes in particular. 06/28/2021 upon evaluation patient unfortunately continues to have significant drainage from his feet. We have been keeping him in a compression wrap and despite this he still continues to have extreme fluid issues he seen his cardiologist he is seeing the nephrologist. We really cannot find any way to get this under control when he did well was when he was in the hospital and they got some of the fluid away. But outside of that we are just struggling to achieve the long-term goal of getting this under control and keeping it under control unfortunately. 07/05/2021 upon evaluation today patient appears to be doing poorly in regard to his feet. Unfortunately this continues to be a significant issue and to be honest I am really not certain what to do about it. I referred him to Dr. Randol Kern at [facility] and he does have an appointment although it is [name], [name] ([id number]) 12 April. He also sees his primary care provider on 6 April. He did not want to see Dr. Haynes Kerns until after he saw his PCP that is the reason the appointment so far out. There is really not much I can do in  that regard. Nonetheless I do think that we are still continue to have significant lymphedema issues with significant mount of weeping in regard to the feet and again this has just become extremely difficult to manage to be honest I am not sure if there is something else that Dr. Haynes Kerns or someone else could recommend he also will be seeing Dr. Dellia Nims in 2 weeks when I am on vacation and at that time I will see if Dr. Dellia Nims has any ideas about where to go from here in the meantime. 07/12/2021 upon evaluation today patient appears to be doing  about as well as can be expected with regard to his feet. He does actually see his kidney doctor this Friday. He also will be seeing his primary care provider on April 4 and then following that around mid April he will be seeing Dr. Haynes Kerns at [facility] which was a referral made for him. Again my goal is to try to find out some way to fix this and to be perfectly honest we have had some issues with making any good adjustments. When he was in the hospital and greater amounts of Lasix he was able to get this down and it looked much better upon discharge. With that being said right now things just are not doing nearly as good as what they used to be. 3/13; patient presents for follow-up. He has been using his lymphedema pumps over the past week. He reports an increase in his Lasix dose. He has no issues or complaints today. He denies signs of infection. 07/26/2021 upon evaluation today patient appears to be doing better in regard to his feet bilaterally. Both are showing signs of much less drainage which is great news and overall very pleased in that regard. Fortunately there does not appear to be any evidence of active infection locally or systemically at this time. No fevers, chills, nausea, vomiting, or diarrhea. 08/02/2021 upon evaluation today patient appears to be doing well with regard to his feet. Both are showing signs of being drier the right pretty much has not  really draining much at all which is great news. The left is not draining anywhere close to his much as it was during the last evaluation. This is excellent news and overall very pleased. 08/09/2021 upon evaluation today patient appears to be doing well with regard to his legs the right leg especially showing signs of excellent improvement which is great news I do not see any evidence of active infection locally or systemically which is great. In regard to the left leg he still has some weeping and drainage but nothing as significant as what it was in the past this is great news. 08-16-2021 upon evaluation today patient appears to actually be doing quite well in my opinion in regard to his feet. This is significantly improved compared to what we previously seen and overall I am extremely pleased in that regard. I do believe that He is actually improving although this is obviously very slow going. 08-23-2021 upon evaluation today patient appears to be doing well currently in regard to his right leg which actually is pretty dry at this point today. Fortunately I do not see any evidence of active infection at this time which is great news. No fevers, chills, nausea, vomiting, or diarrhea. 08-30-2021 upon evaluation today patient appears to be doing well with regard to his lower extremities. The right foot is pretty much completely dry which is great news the left foot though not completely dry seems to be doing decently well. I do not see any signs of active infection locally or systemically which is great news. No fevers, chills, nausea, vomiting, or diarrhea. 09-06-2021 upon evaluation today patient appears to be doing well with regard to his feet in fact now the left foot is almost completely dry as well and I am definitely seeing a lot of significant improvement. With that being said unfortunately he actually appears to have some cellulitis of his right thigh. His toes are also little bit red but this may  just be due to the increased swelling. He really  is not warm to touch in regard to the toes. 5/8; excellent edema control on the right foot and lower leg there is no open wounds but we continue to put compression on this otherwise this will breakdown. He is using his compression pumps twice a day at home The area that is problematic is on the left dorsal foot some areas that are not fully epithelialized with very dry fissured skin over this area. 09-20-2021 upon evaluation today patient appears to be doing a little bit worse in regard to swelling at this time. Fortunately I do not see any signs of infection with that being said the wrap was not on quite as well as what I would like to have seen. I do believe that this has caused a little bit of excess swelling and again we need to try to get this under good control. 09-27-2021 upon evaluation today patient appears to be doing awesome in regard to his feet and legs. Everything is measuring smaller the swelling is down and to be perfectly honest I am extremely pleased with the end of his feet especially on the left side and how dry this is today. I do think we are on the right track here. 5/31; patient presents for follow-up. He is using nystatin powder to the feet bilaterally under 4-layer compression. He has no issues or complaints today. He states he is going to the lymphedema clinic tomorrow. Since he has no open wounds on the right lower extremity they will be focused on the side. 10-11-2021 upon evaluation today patient appears to be doing excellent in regard to his legs. He has been tolerating the dressing changes without complication. Fortunately there does not appear to be any evidence of active infection locally or systemically at this time which is great news. No fevers, chills, nausea, vomiting, or diarrhea. 10-18-2021 upon evaluation today patient appears to be doing well with regard to his legs. He has been tolerating the dressing changes  without complication and actually seen in lymphedema clinic now in regard to his right leg were taken care of the left leg. Fortunately I do not see any evidence of active infection locally or systemically at this time. 10-26-2021 upon evaluation today patient's wounds actually are showing signs of doing well in fact he really does not have wounds as much is weeping of the lower extremities. Fortunately I do not see any evidence of active infection locally or systemically which is great news. No fever or chills noted 11-02-2021 upon evaluation today patient appears to be doing decently well in regard to his legs. Of actually been on the phone quite a bit discussing with [facility] orthotics custom compression for him. I also have been discussing everything with his lymphedema clinic provider as well. Helene Kelp notes that there is really not much more that she can do for him which was noted last week as well. Subsequently I do believe that the patient would benefit from getting custom compression in fact I think that is what he is going require to keep anything under control here. He voiced understanding. 11-08-2021 upon evaluation today patient appears to be doing excellent in regard to his legs and feet. Fortunately I do not see any signs of infection at this time which is great news. No fever or chills noted again I am still been working on trying to figure out what exactly we need to do as far as getting the [place] involved in coverage for custom compression for this patient. I also  discussed with him that he does need to have custom shoes [place], [name] ([id number]) which I completely understand. With that being said it does make it difficult thinking about what kind of combination of shoes and compression is going to be best for him for the long-term. Fortunately I do not see any evidence of active infection locally or systemically which is great news. Overall I think you are doing quite well. 11-16-2021 upon  evaluation today patient appears to be doing well currently in regard to his legs and feet. Everything is significantly smaller compared to what has been. Fortunately I do not see any evidence of active infection locally or systemically at this time which is great news. 11-23-2021 upon evaluation today patient appears to be doing well with regard to his legs bilaterally. We can actually initiate treatment with a recommendation today to utilize Tubigrip and try to see if we can get this under better control. The patient is in agreement with the plan. Nonetheless the big question is whether this will be something that he can utilize at home in order to keep his swelling down and allow him to be able to function without having to come into the clinic as frequently. He notes that he would love to get to this point. He does have a cousin who is willing to help him with change out the Tubigrip when necessary. 7/25; the patient does not have any open wounds however his left leg is a lot more swollen than it was last week. His cousin came in who presumably would be the one to change his juxta lites to practice on doing this for the patient. She apparently lives in the [place] block. In the meantime he has been using Tubigrip 12-07-2021 upon evaluation today patient appears to be doing well with regard to his legs although we are transitioning to try to get him to use the Tubigrip which again has been a little bit of a setback but not too much overall I think he is still doing well he still able to wear his current shoes which is good news. He does have a cousin who is willing to help him with the Tubigrip and we are training her how to do this as well. She is here today. 12-14-2021 upon evaluation today patient appears to be doing well currently in regard to his right leg which I think is doing excellent with the Tubigrip. With regard to the left leg unfortunately this is not doing nearly as well and will get me to  do something to intervene here. I think getting back into a 4-layer wraps can be the way to go short-term To get this back under control and the patient voiced understanding. 12-21-2021 upon evaluation today patient appears to be tolerating the dressing changes currently without complication the wrap of the left leg is doing well the right leg reason Tubigrip. The wound is looking significantly improved. Objective Constitutional Well-nourished and well-hydrated in no acute distress. Vitals Time Taken: 11:07 AM, Weight: 312 lbs, Temperature: 98.4 F, Pulse: 61 bpm, Respiratory Rate: 18 breaths/min, Blood Pressure: 142/67 mmHg. Respiratory normal breathing without difficulty. Psychiatric this patient is able to make decisions and demonstrates good insight into disease process. Alert and Oriented x 3. pleasant and cooperative. General Notes: Upon inspection patient's wound bed actually showed signs of good granulation and epithelization at this point. Fortunately there does not appear to be any evidence of active infection locally or systemically which is great news and overall very  pleased with regard to what I am seeing today. 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 Post procedure Diagnosis Wound #: Same as Pre-Procedure Plan Follow-up Appointments: Return Appointment in 1 week. Nurse Visit as needed Bathing/ Shower/ Hygiene: May shower with wound dressing protected with water  repellent cover or cast protector. No tub bath. Anesthetic (Use 'Patient Medications' Section for Anesthetic Order Entry): Lidocaine applied to wound bed Edema Control - Lymphedema / Segmental Compressive Device / Other: 4 Layer Compression System Lymphedema. - left side with nystatin between toes and silver cell to any weeping areas with tubi c double layer to ankle/foot Tubigrip double layer applied - Nystatin powder between toes double size d bilat just behind toes to patella knotch , double size C foot ankles enclose toes to just above ankle scell to any weeping areas Elevate, Exercise Daily and Avoid Standing for Long Periods of Time. Elevate legs to the level of the heart and pump ankles as often as possible Elevate leg(s) parallel to the floor when sitting. Compression Pump: Use compression pump on left lower extremity for 60 minutes, twice daily. - 2 times per day DO YOUR BEST to sleep in the bed at night. DO NOT sleep in your recliner. Long hours of sitting in a recliner leads to swelling of the legs and/or potential wounds on your backside. Other: - Contact prescriber regarding use of diuretics to reduce fluid overload. Off-Loading: Turn and reposition every 2 hours Additional Orders / Instructions: Follow Nutritious Diet and Increase Protein Intake Medications-Please add to medication list.: P.O. Antibiotics 1. I would recommend that we going continue with the wound care measures as before and the patient is in agreement with plan. This includes the use of the silver alginate to the wound bed which is good at this point. 2. I am also can recommend that we have the patient continue to monitor for any signs of worsening or infection. Obviously if anything changes he should let me know. 3 we will continue with the 4-layer compression wrap of the left leg on the right leg region Tubigrip. We will see patient back for reevaluation in 1 week here in the clinic. If anything worsens or  changes patient will contact our office for additional recommendations. Electronic Signature(s) Signed: 12/21/2021 11:53:22 AM By: Worthy Keeler PA-C Entered By: Worthy Keeler on 12/21/2021 11:53:22 [name] ([id number]) -------------------------------------------------------------------------------- SuperBill Details Patient Name: [name] Date of Service: 12/21/2021 Medical Record Number: [id number] Patient Account Number: [id number] Date of Birth/Sex: Mar 12, 1951 (71 y.o. M) Treating RN: [name] Primary Care Provider: Fontaine No Other Clinician: Referring Provider: Fontaine No Treating Provider/Extender: Skipper Cliche in Treatment: 47 Diagnosis Coding ICD-10 Codes Code Description E11.622 Type 2 diabetes mellitus with other skin ulcer I89.0 Lymphedema, not elsewhere classified I87.2 Venous insufficiency (chronic) (peripheral) L97.822 Non-pressure chronic ulcer of other part of left lower leg with fat layer exposed L97.512 Non-pressure chronic ulcer of other part of right foot with fat layer exposed I10 Essential (primary) hypertension L97.811 Non-pressure chronic ulcer of other part of right lower leg limited  to breakdown of skin Facility Procedures CPT4 Code: [id number] Description: (Facility Use Only) Walker LT LEG Modifier: Quantity: 1 Electronic Signature(s) Signed: 12/23/2021 5:21:19 PM By: Worthy Keeler PA-C Signed: 12/24/2021 6:13:50 PM By: [name] RN Previous Signature: 12/21/2021 11:53:41 AM Version By: Worthy Keeler PA-C Entered By: [name] on 12/21/2021 11:53:50

## 2021-12-22 NOTE — Progress Notes (Addendum)
[name], [name] ([id number]) Visit Report for 12/21/2021 Arrival Information Details Patient Name: [name], [name] Date of Service: 12/21/2021 11:00 AM Medical Record Number: [id number] Patient Account Number: [id number] Date of Birth/Sex: 1950/06/12 (71 y.o. M) Treating RN: [name] Primary Care Dezman Granda: Fontaine No Other Clinician: Referring Deosha Werden: Fontaine No Treating London Nonaka/Extender: Skipper Cliche in Treatment: 8 Visit Information History Since Last Visit All ordered tests and consults were completed: No Patient Arrived: [name] Added or deleted any medications: No Arrival Time: 11:03 Any new allergies or adverse reactions: No Accompanied By: self Had a fall or experienced change in No Transfer Assistance: None activities of daily living that may affect Patient Identification Verified: Yes risk of falls: Secondary Verification Process Completed: Yes Signs or symptoms of abuse/neglect since last visito No Patient Requires Transmission-Based No Hospitalized since last visit: No Precautions: Implantable device outside of the clinic excluding No Patient Has Alerts: Yes cellular tissue based products placed in the center Patient Alerts: AVVS consult on file since last visit: Last ABI-R 1.09; L Has Dressing in Place as Prescribed: Yes 1.04 Has Compression in Place as Prescribed: Yes Pain Present Now: No Electronic Signature(s) Signed: 12/24/2021 6:13:50 PM By: [name] RN Entered By: [name] on 12/21/2021 11:07:17 [name] ([id number]) -------------------------------------------------------------------------------- Clinic Level of Care Assessment Details Patient Name: [name] Date of Service: 12/21/2021 11:00 AM Medical Record Number: [id number] Patient Account Number: [id number] Date of Birth/Sex: 1950-11-19 (71 y.o. M) Treating RN: [name] Primary Care Torian Quintero: Fontaine No Other Clinician: Referring Cherita Hebel:  Fontaine No Treating Dewight Catino/Extender: Skipper Cliche in Treatment: 47 Clinic Level of Care Assessment Items TOOL 1 Quantity Score _0  - Use when EandM and Procedure is performed on INITIAL visit 0 ASSESSMENTS - Nursing Assessment / Reassessment _1  - General Physical Exam (combine w/ comprehensive assessment (listed just below) when performed on new 0 pt. evals) _2  - 0 Comprehensive Assessment (HX, ROS, Risk Assessments, Wounds Hx, etc.) ASSESSMENTS - Wound and Skin Assessment / Reassessment _3  - Dermatologic / Skin Assessment (not related to wound area) 0 ASSESSMENTS - Ostomy and/or Continence Assessment and Care _4  - Incontinence Assessment and Management 0 _5  - 0 Ostomy Care Assessment and Management (repouching, etc.) PROCESS - Coordination of Care _6  - Simple Patient / Family Education for ongoing care 0 _7  - 0 Complex (extensive) Patient / Family Education for ongoing care _8  - 0 Staff obtains Programmer, systems, Records, Test Results / Process Orders _9  - 0 Staff telephones HHA, Nursing Homes / Clarify orders / etc _10  - 0 Routine Transfer to another Facility (non-emergent condition) _11  - 0 Routine Hospital Admission (non-emergent condition) _12  - 0 New Admissions / Biomedical engineer / Ordering NPWT, Apligraf, etc. _13  - 0 Emergency Hospital Admission (emergent condition) PROCESS - Special Needs _14  - Pediatric / Minor Patient Management 0 _15  - 0 Isolation Patient Management _16  - 0 Hearing / Language / Visual special needs _17  - 0 Assessment of Community assistance (transportation, D/C planning, etc.) _18  - 0 Additional assistance / Altered mentation _19  - 0 Support Surface(s) Assessment (bed, cushion, seat, etc.) INTERVENTIONS - Miscellaneous _20  - External ear exam 0 _21  - 0 Patient Transfer (multiple staff / Civil Service fast streamer / Similar devices) _22  - 0 Simple Staple / Suture removal (25 or less) _23  - 0 Complex Staple / Suture removal (26 or more) _24  -  0 Hypo/Hyperglycemic Management (do not check if billed separately) _25  - 0 Ankle / Brachial Index (ABI) - do not check if billed separately Has the patient been  seen at the hospital within the last three years: Yes Total Score: 0 Level Of Care: ____ [name] ([id number]) Electronic Signature(s) Signed: 12/24/2021 6:13:50 PM By: [name] RN Entered By: [name] on 12/21/2021 11:53:34 [name] ([id number]) -------------------------------------------------------------------------------- Compression Therapy Details Patient Name: [name] Date of Service: 12/21/2021 11:00 AM Medical Record Number: [id number] Patient Account Number: [id number] Date of Birth/Sex: [birth year] (71 y.o. M) Treating RN: [name] Primary Care Finnian Husted: Fontaine No Other Clinician: Referring Jolisa Intriago: Fontaine No Treating Nawaal Alling/Extender: Skipper Cliche in Treatment: 47 Compression Therapy Performed for Wound Assessment: NonWound Condition Lymphedema - Left Leg Performed By: Clinician [name], RN Compression Type: Four Layer Post Procedure Diagnosis Same as Pre-procedure Electronic Signature(s) Signed: 12/24/2021 6:13:50 PM By: [name] RN Entered By: [name] on 12/21/2021 11:30:57 [name] ([id number]) -------------------------------------------------------------------------------- Encounter Discharge Information Details Patient Name: [name] Date of Service: 12/21/2021 11:00 AM Medical Record Number: [id number] Patient Account Number: [id number] Date of Birth/Sex: 01-27-1951 (71 y.o. M) Treating RN: [name] Primary Care Cristella Stiver: Fontaine No Other Clinician: Referring Oceanna Arruda: Fontaine No Treating Shinichi Anguiano/Extender: Skipper Cliche in Treatment: 58 Encounter Discharge Information Items Discharge Condition: Stable Ambulatory Status: Cane Discharge Destination: Home Transportation: Private Auto Accompanied By:  self Schedule Follow-up Appointment: Yes Clinical Summary of Care: Electronic Signature(s) Signed: 12/24/2021 6:13:50 PM By: [name] RN Entered By: [name] on 12/21/2021 11:55:12 [name] ([id number]) -------------------------------------------------------------------------------- Lower Extremity Assessment Details Patient Name: [name] Date of Service: 12/21/2021 11:00 AM Medical Record Number: [id number] Patient Account Number: [id number] Date of Birth/Sex: [birth year] (71 y.o. M) Treating RN: [name] Primary Care Khaya Theissen: Fontaine No Other Clinician: Referring Arlynn Stare: Fontaine No Treating Colsen Modi/Extender: Skipper Cliche in Treatment: 47 Edema Assessment Assessed: [Left: No] [Right: No] Edema: [Left: Yes] [Right: Yes] Calf Left: Right: Point of Measurement: 38 cm From Medial Instep 37 cm 38 cm Ankle Left: Right: Point of Measurement: 14 cm From Medial Instep 25 cm 25 cm Vascular Assessment Pulses: Dorsalis Pedis Palpable: [Left:Yes] [Right:Yes] Electronic Signature(s) Signed: 12/24/2021 6:13:50 PM By: [name] RN Entered By: [name] on 12/21/2021 11:20:09 [name] ([id number]) -------------------------------------------------------------------------------- Multi Wound Chart Details Patient Name: [name] Date of Service: 12/21/2021 11:00 AM Medical Record Number: [id number] Patient Account Number: [id number] Date of Birth/Sex: 11-21-1950 (71 y.o. M) Treating RN: [name] Primary Care Mali Eppard: Fontaine No Other Clinician: Referring Huberta Tompkins: Fontaine No Treating Annmargaret Decaprio/Extender: Skipper Cliche in Treatment: 47 Vital Signs Height(in): Pulse(bpm): 61 Weight(lbs): 312 Blood Pressure(mmHg): 142/67 Body Mass Index(BMI): Temperature(F): 98.4 Respiratory Rate(breaths/min): 18 Wound Assessments Treatment Notes Electronic Signature(s) Signed: 12/24/2021 6:13:50 PM By: [name]  RN Entered By: [name] on 12/21/2021 11:20:26 [name] ([id number]) -------------------------------------------------------------------------------- [place] Details Patient Name: [name] Date of Service: 12/21/2021 11:00 AM Medical Record Number: [id number] Patient Account Number: [id number] Date of Birth/Sex: Nov 25, 1950 (71 y.o. M) Treating RN: [name] Primary Care Kaleeah Gingerich: Fontaine No Other Clinician: Referring Monserrath Junio: Fontaine No Treating Batul Diego/Extender: Skipper Cliche in Treatment: 60 Active Inactive Soft Tissue Infection Nursing Diagnoses: Impaired tissue integrity Knowledge deficit related to disease process and management Knowledge deficit related to home infection control: handwashing, handling of soiled dressings, supply storage Potential for infection: soft tissue Goals: Patient/caregiver will verbalize understanding of or measures to prevent infection and contamination in the home setting Date Initiated: 09/06/2021 Date Inactivated: 10/18/2021 Target Resolution Date: 09/06/2021 Goal Status: Met Patient's soft tissue infection will resolve Date Initiated: 09/06/2021 Date Inactivated: 10/18/2021 Target Resolution Date: 09/06/2021 Goal Status: Met Signs  and symptoms of infection will be recognized early to allow for prompt treatment Date Initiated: 09/06/2021 Target Resolution Date: 11/28/2021 Goal Status: Active Interventions: Assess signs and symptoms of infection every visit Provide education on infection Treatment Activities: Education provided on Infection : 12/14/2021 Systemic antibiotics : 09/06/2021 Notes: Electronic Signature(s) Signed: 12/24/2021 6:13:50 PM By: [name] RN Entered By: [name] on 12/21/2021 11:20:19 [name] ([id number]) -------------------------------------------------------------------------------- Pain Assessment Details Patient Name: [name] Date of Service:  12/21/2021 11:00 AM Medical Record Number: [id number] Patient Account Number: [id number] Date of Birth/Sex: 1951/01/22 (71 y.o. M) Treating RN: [name] Primary Care Dann Galicia: Fontaine No Other Clinician: Referring Rami Budhu: Fontaine No Treating Marcille Barman/Extender: Skipper Cliche in Treatment: 51 Active Problems Location of Pain Severity and Description of Pain Patient Has Paino No Site Locations Pain Management and Medication Current Pain Management: Electronic Signature(s) Signed: 12/24/2021 6:13:50 PM By: [name] RN Entered By: [name] on 12/21/2021 11:07:44 [name] ([id number]) -------------------------------------------------------------------------------- Patient/Caregiver Education Details Patient Name: [name] Date of Service: 12/21/2021 11:00 AM Medical Record Number: [id number] Patient Account Number: [id number] Date of Birth/Gender: [birth year] (71 y.o. M) Treating RN: [name] Primary Care Physician: Fontaine No Other Clinician: Referring Physician: Fontaine No Treating Physician/Extender: Skipper Cliche in Treatment: 48 Education Assessment Education Provided To: Patient Education Topics Provided Infection: Methods: Explain/Verbal Responses: State content correctly Electronic Signature(s) Signed: 12/24/2021 6:13:50 PM By: [name] RN Entered By: [name] on 12/21/2021 11:54:03 [name] ([id number]) -------------------------------------------------------------------------------- [place] Details Patient Name: [name] Date of Service: 12/21/2021 11:00 AM Medical Record Number: [id number] Patient Account Number: [id number] Date of Birth/Sex: March 27, 1951 (71 y.o. M) Treating RN: [name] Primary Care Christana Angelica: Fontaine No Other Clinician: Referring Vivan Vanderveer: Fontaine No Treating Ayliana Casciano/Extender: Skipper Cliche in Treatment: 47 Vital Signs Time Taken:  11:07 Temperature (F): 98.4 Weight (lbs): 312 Pulse (bpm): 61 Respiratory Rate (breaths/min): 18 Blood Pressure (mmHg): 142/67 Reference Range: 80 - 120 mg / dl Electronic Signature(s) Signed: 12/24/2021 6:13:50 PM By: [name] RN Entered By: [name] on 12/21/2021 11:07:33

## 2021-12-27 ENCOUNTER — Encounter: Payer: PPO | Admitting: Occupational Therapy

## 2021-12-28 ENCOUNTER — Encounter: Payer: No Typology Code available for payment source | Admitting: Physician Assistant

## 2021-12-28 DIAGNOSIS — E11621 Type 2 diabetes mellitus with foot ulcer: Secondary | ICD-10-CM | POA: Diagnosis not present

## 2021-12-28 NOTE — Progress Notes (Signed)
[name], [name] ([id number]) Visit Report for 12/28/2021 Chief Complaint Document Details Patient Name: [name], [name] Date of Service: 12/28/2021 11:00 AM Medical Record Number: [id number] Patient Account Number: [id number] Date of Birth/Sex: 1951-01-17 (71 y.o. M) Treating RN: [name] Primary Care Provider: Fontaine No Other Clinician: Referring Provider: Fontaine No Treating Provider/Extender: Skipper Cliche in Treatment: 3 Information Obtained from: Patient Chief Complaint Left LE ulcers Electronic Signature(s) Signed: 12/28/2021 11:09:38 AM By: Worthy Keeler PA-C Entered By: Worthy Keeler on 12/28/2021 11:09:38 [name] ([id number]) -------------------------------------------------------------------------------- Physician Orders Details Patient Name: [name] Date of Service: 12/28/2021 11:00 AM Medical Record Number: [id number] Patient Account Number: [id number] Date of Birth/Sex: 09/20/1950 (71 y.o. M) Treating RN: [name] Primary Care Provider: Fontaine No Other Clinician: Referring Provider: Fontaine No Treating Provider/Extender: Skipper Cliche in Treatment: 50 Verbal / Phone Orders: No Diagnosis Coding ICD-10 Coding Code Description E11.622 Type 2 diabetes mellitus with other skin ulcer I89.0 Lymphedema, not elsewhere classified I87.2 Venous insufficiency (chronic) (peripheral) L97.822 Non-pressure chronic ulcer of other part of left lower leg with fat layer exposed L97.512 Non-pressure chronic ulcer of other part of right foot with fat layer exposed I10 Essential (primary) hypertension L97.811 Non-pressure chronic ulcer of other part of right lower leg limited to breakdown of skin Follow-up Appointments o Return Appointment in 1 week. o Nurse Visit as needed Bathing/ Shower/ Hygiene o May shower with wound dressing protected with water repellent cover or cast protector. o No tub bath. Anesthetic  (Use 'Patient Medications' Section for Anesthetic Order Entry) o Lidocaine applied to wound bed Edema Control - Lymphedema / Segmental Compressive Device / Other Bilateral Lower Extremities o 4 Layer Compression System Lymphedema. - left side with nystatin between toes and silver cell to any weeping areas with tubi c double layer to ankle/foot o Tubigrip double layer applied - Nystatin powder between toes double size d bilat just behind toes to patella knotch , double size C foot ankles enclose toes to just above ankle scell to any weeping areas o Elevate, Exercise Daily and Avoid Standing for Long Periods of Time. o Elevate legs to the level of the heart and pump ankles as often as possible o Elevate leg(s) parallel to the floor when sitting. o Compression Pump: Use compression pump on left lower extremity for 60 minutes, twice daily. - 2 times per day o DO YOUR BEST to sleep in the bed at night. DO NOT sleep in your recliner. Long hours of sitting in a recliner leads to swelling of the legs and/or potential wounds on your backside. o Other: - Contact prescriber regarding use of diuretics to reduce fluid overload. Off-Loading o Turn and reposition every 2 hours Additional Orders / Instructions o Follow Nutritious Diet and Increase Protein Intake Medications-Please add to medication list. o P.O. Antibiotics Electronic Signature(s) Unsigned Entered ByCarlene Coria on 12/28/2021 11:24:59 Signature(s): Date(s): [name], [name] ([id number]) -------------------------------------------------------------------------------- Problem List Details Patient Name: [name], [name] Date of Service: 12/28/2021 11:00 AM Medical Record Number: [id number] Patient Account Number: [id number] Date of Birth/Sex: 08/18/1950 (71 y.o. M) Treating RN: [name] Primary Care Provider: Fontaine No Other Clinician: Referring Provider: Fontaine No Treating  Provider/Extender: Skipper Cliche in Treatment: 48 Active Problems ICD-10 Encounter Code Description Active Date MDM Diagnosis E11.622 Type 2 diabetes mellitus with other skin ulcer 01/26/2021 No Yes I89.0 Lymphedema, not elsewhere classified 01/26/2021 No Yes I87.2 Venous insufficiency (chronic) (peripheral) 01/26/2021 No Yes L97.822 Non-pressure chronic ulcer of other part of left lower leg with  fat layer 01/26/2021 No Yes exposed L97.512 Non-pressure chronic ulcer of other part of right foot with fat layer 01/26/2021 No Yes exposed Excel (primary) hypertension 01/26/2021 No Yes L97.811 Non-pressure chronic ulcer of other part of right lower leg limited to 02/15/2021 No Yes breakdown of skin Inactive Problems Resolved Problems Electronic Signature(s) Signed: 12/28/2021 11:09:34 AM By: Worthy Keeler PA-C Entered By: Worthy Keeler on 12/28/2021 11:09:34 [name] ([id number]) -------------------------------------------------------------------------------- [place] Details Patient Name: [name] Date of Service: 12/28/2021 Medical Record Number: [id number] Patient Account Number: [id number] Date of Birth/Sex: 12-Jun-1950 (71 y.o. M) Treating RN: [name] Primary Care Provider: Fontaine No Other Clinician: Referring Provider: Fontaine No Treating Provider/Extender: Skipper Cliche in Treatment: 48 Diagnosis Coding ICD-10 Codes Code Description E11.622 Type 2 diabetes mellitus with other skin ulcer I89.0 Lymphedema, not elsewhere classified I87.2 Venous insufficiency (chronic) (peripheral) L97.822 Non-pressure chronic ulcer of other part of left lower leg with fat layer exposed L97.512 Non-pressure chronic ulcer of other part of right foot with fat layer exposed I10 Essential (primary) hypertension L97.811 Non-pressure chronic ulcer of other part of right lower leg limited to breakdown of skin Facility Procedures CPT4 Code:  [id number] Description: 65465 - WOUND CARE VISIT-LEV 2 EST PT Modifier: Quantity: 1 Electronic Signature(s) Unsigned Entered ByCarlene Coria on 12/28/2021 11:46:43 Signature(s): Date(s):

## 2021-12-28 NOTE — Progress Notes (Signed)
[name], [name] ([id number]) Visit Report for 12/28/2021 Arrival Information Details Patient Name: [name], [name] Date of Service: 12/28/2021 11:00 AM Medical Record Number: [id number] Patient Account Number: [id number] Date of Birth/Sex: 09-Apr-1951 (71 y.o. M) Treating RN: [name] Primary Care Hatsuko Bizzarro: Fontaine No Other Clinician: Referring Shar Paez: Fontaine No Treating Antonia Jicha/Extender: Skipper Cliche in Treatment: 79 Visit Information History Since Last Visit All ordered tests and consults were completed: No Patient Arrived: [name] Added or deleted any medications: No Arrival Time: 11:19 Any new allergies or adverse reactions: No Accompanied By: self Had a fall or experienced change in No Transfer Assistance: None activities of daily living that may affect Patient Identification Verified: Yes risk of falls: Secondary Verification Process Completed: Yes Signs or symptoms of abuse/neglect since last visito No Patient Requires Transmission-Based No Hospitalized since last visit: No Precautions: Implantable device outside of the clinic excluding No Patient Has Alerts: Yes cellular tissue based products placed in the center Patient Alerts: AVVS consult on file since last visit: Last ABI-R 1.09; L Has Dressing in Place as Prescribed: Yes 1.04 Has Compression in Place as Prescribed: Yes Pain Present Now: No Electronic Signature(s) Unsigned Entered ByCarlene Coria on 12/28/2021 11:20:42 Signature(s): Date(s): [name] ([id number]) -------------------------------------------------------------------------------- Clinic Level of Care Assessment Details Patient Name: [name], [name] Date of Service: 12/28/2021 11:00 AM Medical Record Number: [id number] Patient Account Number: [id number] Date of Birth/Sex: 1951-02-14 (71 y.o. M) Treating RN: [name] Primary Care Hasnain Manheim: Fontaine No Other Clinician: Referring Ilisa Hayworth: Fontaine No Treating Tanzie Rothschild/Extender: Skipper Cliche in Treatment: 28 Clinic Level of Care Assessment Items TOOL 4 Quantity Score X - Use when only an EandM is performed on FOLLOW-UP visit 1 0 ASSESSMENTS - Nursing Assessment / Reassessment X - Reassessment of Co-morbidities (includes updates in patient status) 1 10 X- 1 5 Reassessment of Adherence to Treatment Plan ASSESSMENTS - Wound and Skin Assessment / Reassessment '[]'  - Simple Wound Assessment / Reassessment - one wound 0 '[]'  - 0 Complex Wound Assessment / Reassessment - multiple wounds '[]'  - 0 Dermatologic / Skin Assessment (not related to wound area) ASSESSMENTS - Focused Assessment '[]'  - Circumferential Edema Measurements - multi extremities 0 '[]'  - 0 Nutritional Assessment / Counseling / Intervention '[]'  - 0 Lower Extremity Assessment (monofilament, tuning fork, pulses) '[]'  - 0 Peripheral Arterial Disease Assessment (using hand held doppler) ASSESSMENTS - Ostomy and/or Continence Assessment and Care '[]'  - Incontinence Assessment and Management 0 '[]'  - 0 Ostomy Care Assessment and Management (repouching, etc.) PROCESS - Coordination of Care X - Simple Patient / Family Education for ongoing care 1 15 '[]'  - 0 Complex (extensive) Patient / Family Education for ongoing care '[]'  - 0 Staff obtains Programmer, systems, Records, Test Results / Process Orders '[]'  - 0 Staff telephones HHA, Nursing Homes / Clarify orders / etc '[]'  - 0 Routine Transfer to another Facility (non-emergent condition) '[]'  - 0 Routine Hospital Admission (non-emergent condition) '[]'  - 0 New Admissions / Biomedical engineer / Ordering NPWT, Apligraf, etc. '[]'  - 0 Emergency Hospital Admission (emergent condition) X- 1 10 Simple Discharge Coordination '[]'  - 0 Complex (extensive) Discharge Coordination PROCESS - Special Needs '[]'  - Pediatric / Minor Patient Management 0 '[]'  - 0 Isolation Patient Management '[]'  - 0 Hearing / Language / Visual special needs '[]'  -  0 Assessment of Community assistance (transportation, D/C planning, etc.) '[]'  - 0 Additional assistance / Altered mentation '[]'  - 0 Support Surface(s) Assessment (bed, cushion, seat, etc.) INTERVENTIONS - Wound Cleansing / Measurement [place], [place] ([id number]) '[]'  -  0 Simple Wound Cleansing - one wound '[]'  - 0 Complex Wound Cleansing - multiple wounds '[]'  - 0 Wound Imaging (photographs - any number of wounds) '[]'  - 0 Wound Tracing (instead of photographs) '[]'  - 0 Simple Wound Measurement - one wound '[]'  - 0 Complex Wound Measurement - multiple wounds INTERVENTIONS - Wound Dressings '[]'  - Small Wound Dressing one or multiple wounds 0 '[]'  - 0 Medium Wound Dressing one or multiple wounds '[]'  - 0 Large Wound Dressing one or multiple wounds '[]'  - 0 Application of Medications - topical '[]'  - 0 Application of Medications - injection INTERVENTIONS - Miscellaneous '[]'  - External ear exam 0 '[]'  - 0 Specimen Collection (cultures, biopsies, blood, body fluids, etc.) '[]'  - 0 Specimen(s) / Culture(s) sent or taken to Lab for analysis '[]'  - 0 Patient Transfer (multiple staff / Civil Service fast streamer / Similar devices) '[]'  - 0 Simple Staple / Suture removal (25 or less) '[]'  - 0 Complex Staple / Suture removal (26 or more) '[]'  - 0 Hypo / Hyperglycemic Management (close monitor of Blood Glucose) '[]'  - 0 Ankle / Brachial Index (ABI) - do not check if billed separately X- 1 5 Vital Signs Has the patient been seen at the hospital within the last three years: Yes Total Score: 45 Level Of Care: New/Established - Level 2 Electronic Signature(s) Unsigned Entered ByCarlene Coria on 12/28/2021 11:46:35 Signature(s): Date(s): [name] ([id number]) -------------------------------------------------------------------------------- Encounter Discharge Information Details Patient Name: [name], [name] Date of Service: 12/28/2021 11:00 AM Medical Record Number: [id number] Patient Account Number: [id number] Date of  Birth/Sex: 1950/06/24 (71 y.o. M) Treating RN: [name] Primary Care Xai Frerking: Fontaine No Other Clinician: Referring [name] Dikes: Fontaine No Treating Jayston Trevino/Extender: Skipper Cliche in Treatment: 48 Encounter Discharge Information Items Discharge Condition: Stable Ambulatory Status: Cane Discharge Destination: Home Transportation: Private Auto Accompanied By: self Schedule Follow-up Appointment: Yes Clinical Summary of Care: Electronic Signature(s) Unsigned Entered ByCarlene Coria on 12/28/2021 11:47:48 Signature(s): Date(s): [name] ([id number]) -------------------------------------------------------------------------------- Lower Extremity Assessment Details Patient Name: [name], [name] Date of Service: 12/28/2021 11:00 AM Medical Record Number: [id number] Patient Account Number: [id number] Date of Birth/Sex: Jan 22, 1951 (71 y.o. M) Treating RN: [name] Primary Care Jailin Manocchio: Fontaine No Other Clinician: Referring Divinity Kyler: Fontaine No Treating Srinidhi Landers/Extender: Skipper Cliche in Treatment: 48 Edema Assessment Assessed: [Left: No] Patrice Paradise: No] Edema: [Left: No] [Right: No] Calf Left: Right: Point of Measurement: 38 cm From Medial Instep 36 cm 40 cm Ankle Left: Right: Point of Measurement: 14 cm From Medial Instep 24 cm 25 cm Electronic Signature(s) Unsigned Entered ByCarlene Coria on 12/28/2021 11:23:04 Signature(s): Date(s): [name] ([id number]) -------------------------------------------------------------------------------- Multi Wound Chart Details Patient Name: [name], [name] Date of Service: 12/28/2021 11:00 AM Medical Record Number: [id number] Patient Account Number: [id number] Date of Birth/Sex: [birth year] (71 y.o. M) Treating RN: [name] Primary Care Breck Hollinger: Fontaine No Other Clinician: Referring Kaia Depaolis: Fontaine No Treating Bern Fare/Extender: Skipper Cliche in  Treatment: 48 Vital Signs Height(in): Pulse(bpm): 59 Weight(lbs): 312 Blood Pressure(mmHg): 173/74 Body Mass Index(BMI): Temperature(F): 97.7 Respiratory Rate(breaths/min): 18 Wound Assessments Treatment Notes Electronic Signature(s) Unsigned Entered ByCarlene Coria on 12/28/2021 11:24:10 Signature(s): Date(s): LEONDRO, CORYELL ([id number]) -------------------------------------------------------------------------------- [place] Details Patient Name: [name], [name] Date of Service: 12/28/2021 11:00 AM Medical Record Number: [id number] Patient Account Number: [id number] Date of Birth/Sex: 1950-05-18 (71 y.o. M) Treating RN: [name] Primary Care Rasheen Schewe: Fontaine No Other Clinician: Referring Katheryne Gorr: Fontaine No Treating Kemba Hoppes/Extender: Skipper Cliche in Treatment: [phone] Active Inactive Soft Tissue Infection Nursing Diagnoses: Impaired  tissue integrity Knowledge deficit related to disease process and management Knowledge deficit related to home infection control: handwashing, handling of soiled dressings, supply storage Potential for infection: soft tissue Goals: Patient/caregiver will verbalize understanding of or measures to prevent infection and contamination in the home setting Date Initiated: 09/06/2021 Date Inactivated: 10/18/2021 Target Resolution Date: 09/06/2021 Goal Status: Met Patient's soft tissue infection will resolve Date Initiated: 09/06/2021 Date Inactivated: 10/18/2021 Target Resolution Date: 09/06/2021 Goal Status: Met Signs and symptoms of infection will be recognized early to allow for prompt treatment Date Initiated: 09/06/2021 Target Resolution Date: 11/28/2021 Goal Status: Active Interventions: Assess signs and symptoms of infection every visit Provide education on infection Treatment Activities: Education provided on Infection : 12/21/2021 Systemic antibiotics : 09/06/2021 Notes: Electronic  Signature(s) Unsigned Entered By: [name] on 12/28/2021 11:23:27 Signature(s): Date(s): [name] ([id number]) -------------------------------------------------------------------------------- Pain Assessment Details Patient Name: [name], [name] Date of Service: 12/28/2021 11:00 AM Medical Record Number: [id number] Patient Account Number: [id number] Date of Birth/Sex: [birth year] (71 y.o. M) Treating RN: [name] Primary Care Sherron Mapp: Fontaine No Other Clinician: Referring Abegail Kloeppel: Fontaine No Treating Elleah Hemsley/Extender: Skipper Cliche in Treatment: 48 Active Problems Location of Pain Severity and Description of Pain Patient Has Paino No Site Locations Pain Management and Medication Current Pain Management: Electronic Signature(s) Unsigned Entered ByCarlene Coria on 12/28/2021 11:21:41 Signature(s): Date(s): [name] ([id number]) -------------------------------------------------------------------------------- Patient/Caregiver Education Details Patient Name: [name], [name] Date of Service: 12/28/2021 11:00 AM Medical Record Number: [id number] Patient Account Number: [id number] Date of Birth/Gender: [birth year] (71 y.o. M) Treating RN: [name] Primary Care Physician: Fontaine No Other Clinician: Referring Physician: Fontaine No Treating Physician/Extender: Skipper Cliche in Treatment: 51 Education Assessment Education Provided To: Patient Education Topics Provided Infection: Methods: Explain/Verbal Responses: State content correctly Electronic Signature(s) Unsigned Entered ByCarlene Coria on 12/28/2021 11:47:06 Signature(s): Date(s): [name] ([id number]) -------------------------------------------------------------------------------- [place] Details Patient Name: [name], [name] Date of Service: 12/28/2021 11:00 AM Medical Record Number: [id number] Patient Account Number: [id number] Date of Birth/Sex:  Mar 06, 1951 (71 y.o. M) Treating RN: [name] Primary Care Dynasia Kercheval: Fontaine No Other Clinician: Referring Keyondra Lagrand: Fontaine No Treating Gudrun Axe/Extender: Skipper Cliche in Treatment: 48 Vital Signs Time Taken: 11:21 Temperature (F): 97.7 Weight (lbs): 312 Pulse (bpm): 59 Respiratory Rate (breaths/min): 18 Blood Pressure (mmHg): 173/74 Reference Range: 80 - 120 mg / dl Electronic Signature(s) Unsigned Entered ByCarlene Coria on 12/28/2021 11:21:29 Signature(s): Date(s):

## 2021-12-29 ENCOUNTER — Encounter: Payer: PPO | Admitting: Occupational Therapy

## 2022-01-03 ENCOUNTER — Encounter: Payer: PPO | Admitting: Occupational Therapy

## 2022-01-04 ENCOUNTER — Encounter: Payer: No Typology Code available for payment source | Admitting: Physician Assistant

## 2022-01-04 DIAGNOSIS — E11621 Type 2 diabetes mellitus with foot ulcer: Secondary | ICD-10-CM | POA: Diagnosis not present

## 2022-01-04 NOTE — Progress Notes (Addendum)
Mooney, [name] ([id number]) Visit Report for 01/04/2022 Chief Complaint Document Details Patient Name: [name], [name] Date of Service: 01/04/2022 11:00 AM Medical Record Number: [id number] Patient Account Number: [id number] Date of Birth/Sex: 1951-01-09 (71 y.o. M) Treating RN: [name] Primary Care Provider: Fontaine No Other Clinician: Referring Provider: Fontaine No Treating Provider/Extender: Skipper Cliche in Treatment: 29 Information Obtained from: Patient Chief Complaint Left LE ulcers Electronic Signature(s) Signed: 01/04/2022 11:14:11 AM By: [name] Keeler PA-C Entered By: [name] on 01/04/2022 11:14:11 [name] ([id number]) -------------------------------------------------------------------------------- HPI Details Patient Name: [name] Date of Service: 01/04/2022 11:00 AM Medical Record Number: [id number] Patient Account Number: [id number] Date of Birth/Sex: July 10, 1950 (71 y.o. M) Treating RN: [name] Primary Care Provider: Fontaine No Other Clinician: Referring Provider: Fontaine No Treating Provider/Extender: Skipper Cliche in Treatment: 85 History of Present Illness HPI Description: 71 year old male who presented to the ER with bilateral lower extremity blisters which had started last week. he has a past medical history of leukemia, diabetes mellitus, hypertension, edema of both lower extremities, his recurrent skin infections, peripheral vascular disease, coronary artery disease, congestive heart failure and peripheral neuropathy. in the ER he was given Rocephin and put on Silvadene cream. he was put on oral doxycycline and was asked to follow-up with the [facility]. His last hemoglobin A1c was 6.6 in December and he checks his blood sugar once a week. He does not have any physicians outside the [place] system. He does not recall any vascular duplex studies done either for arterial or venous disease but was told  to wear compression stockings which he does not use 05/30/2016 -- we have not yet received any of his notes from the [facility] hospital system and his arterial and venous duplex studies are scheduled here in [place] around mid February. We are unable to have his insurance accepted by home health agencies and hence he is getting dressings only once a week. 06/06/16 -- -- I received a call from the patient's PCP at the [facility] and spoke to Dr. Garvin Mooney, phone number [phone] and fax number [phone]. She confirmed that no vascular testing was done over the last 5 years and she would be happy to do them if the patient did want them to be done at the [place] and we could fax him a request. Readmission: 71 year old male seen by as in February of this year and was referred to vein and vascular for studies and opinion from the vascular surgeons. The patient returns today with a fresh problem having had blisters on his left lower extremity which have been there for about 5 days and he clearly states that he has been wearing his compression stockings as advised though he could not read the moderate compression and has been wearing light compression. Review of his electronic medical records note that he had lower extremity arterial duplex examination done on 06/23/2016 which showed no hemodynamically significant stenosis in the bilateral lower extremity arterial system. He also had a lower extremity venous reflux examination done on 07/07/2016 and it was noted that he had venous incompetence in the right great saphenous vein and bilateral common femoral veins. Patient was seen by Dr. Tamala Mooney on the same day and for some reason his notes do not reflect the venous studies or the arterial studies and he recommended patient do a venous duplex ultrasound to look for reflux and return to see him.he would also consider a lymph pump if required. The patient was told that his workup  was normal and hence the  patient canceled his follow-up appointment. 02/03/17 on evaluation today patient left medial lower extremity blister appears to be doing about the same. It is still continuing to drain and there's still the blistered skin covering the wound bed which is making it difficult for the alternate to do its job. Fortunately there is no evidence of cellulitis. No fevers chills noted. Patient states in general he is not having any significant discomfort. Patient's lower extremity arterial duplex exam revealed that patient was hemodynamically stable with no evidence of stenosis in regard to the bilateral lower extremities. The lower extremity venous reflux exam revealed the patient had venous incontinence noted in the right greater saphenous and bilateral common femoral vein. There is no evidence of deep or superficial vein thrombosis in the bilateral lower extremities. Readmission: 11/12/18 Patient presents for evaluation our clinic today concerning issues that he is having with his left lower extremity. He tells me that a couple weeks ago he began developing blisters on the left lower extremity along with increased swelling. He typically wears his compression stockings on a regular basis is previously been evaluated both here as well is with vascular surgery they would recommend lymphedema pumps but unfortunately that somehow fell through and he never heard anything back from that. Nonetheless I think lymphedema pumps would be beneficial for this patient. He does have a history of hypertension and diabetes. Obviously the chronic venous stasis and lymphedema as well. At this point the blisters have been given in more trouble he states sometimes when the blisters openings able to clean it down with alcohol and it will dry out and do well. Unfortunately that has not been the case this time. He is having some discomfort although this mean these with cleaning the areas he doesn't have discomfort just on a regular  basis. He has not been able to wear his compression stockings since the blisters arose due to the fact that of course it will drain into the socks causing additional issues and he didn't have any way to wrap this otherwise. He has increased to taking his Lasix every day instead of every other day. He sees his primary care provider later this month as well. No fevers, chills, nausea, or vomiting noted at this time. 11/19/18-Patient returns at 1 week, per intake RN the amount of seepage into the compression wraps was definitely improved, overall all the wounds are measuring smaller but continuing silver alginate to the wounds as primary dressing 11/26/18 on evaluation today patient appears to be doing quite well in regard to his left lower [place] ulcers. In fact of the areas that were noted initially he only has two regions still open. There is no evidence of active infection at this time. He still is not heard anything from the company regarding lymphedema pumps as of yet. Again as previously seen vascular they have not recommended any surgical intervention. 12/03/2018 on evaluation today patient actually appears to be doing quite well with regard to his lower extremity ulcers. In fact most of the areas appear to be healed the one spot which does not seem to be completely healed I am unsure of whether or not this is really draining that much but nonetheless there does not appear to be any signs of infection or significant drainage at this point. There is no sign of fever, chills, nausea, vomiting, or diarrhea. Overall I am pleased with how things have progressed I think is very close to being able to transition  to his home compression stockings. MALACHY, COLEMAN ([id number]) 12/10/2018 upon evaluation today patient appears to be doing quite well with regard to his left lower extremity. He has been tolerating the dressing changes without complication. Fortunately there is no signs of active infection at this  time. He appears after thorough evaluation of his leg to only have 1 small area that remains open at this point everything else appears to be almost completely closed. He still have significant swelling of the left lower extremity. We had discussed discussing this with his primary care provider he is not able to see her in person they were at the [facility] and right now the [place] is not seeing patients on site. According to the patient anyway. Subsequently he did speak with her apparently and his primary care provider feels that he may likely have a DVT. With that being said she has not seen his leg she is just going off of his history. Nonetheless that is a concern that the patient now has as well and while I do not feel the DVT is likely we can definitely ensure that that is not the case I will go ahead and see about putting that order in today. Nonetheless otherwise I am in a recommend that we continue with the current wound care measures including the compression therapy most likely. We just need to ensure that his leg is indeed free of any DVTs. 12/17/2018 on evaluation today patient actually appears to be completely healed today. He does have 2 very small areas of blistering although this is not anything too significant at this point which is good news. With that being said I am in agreement with the fact that I think he is completely healed at this point. He does want to get back into his compression stocking. The good news is we have gotten approval from insurance for his lymphedema pumps we received a letter since last saw him last week. The other good news is his study did come back and showed no evidence of a DVT. 12/20/2018 on evaluation today patient presents for follow-up concerning his ongoing issues with his left lower extremity. He was actually discharged last Friday and did fairly well until he states blisters opened this morning. He tells me he has been wearing his compression  stocking although he has a hard time getting this on. There does not appear to be any signs of active infection at this time. No fevers, chills, nausea, vomiting, or diarrhea. 12/27/2018 on evaluation today patient appears to be doing very well with regard to his swelling of the left lower extremity the 4 layer compression wrap seems to have been beneficial for him. Fortunately there is no signs of active infection at this time. Patient has been tolerating the compression wrap without complication and his foot swelling in particular appears to be greatly improved. He does still have a wound on the lateral portion of his left leg I believe this is more of a blister that has now reopened. 01/03/2019 on evaluation today patient actually appears to be doing excellent in regard to his left lower extremity. He did receive his compression pumps and is actually use this 7 times since he was last here in the office. On top of the compression wrap he is now roughly 3 cm better at the calf and 2 cm better at the ankle he also states that his foot seem to go an issue better without even having to use a shoe horn. Obviously I  think this is all evidence that he is doing excellent in this regard. The other good news is he does not appear to have anything open today as far as wounds are concerned. 01/15/2019 on evaluation today patient appears to be doing more poorly yet again with regard to his left lower extremity. He has developed new wounds again after being discharged just recently. Unfortunately this continues to be the case that he will heal and then have subsequent new wounds. The last time I was hopeful that he may not end up coming back too quickly especially since he states he has been using his lymphedema pumps along with wearing his compression. Nonetheless he had a blister on the back of his leg that popped up on the left and this has opened up into an ulceration it is quite painful. 01/22/19 on evaluation  today patient actually appears to be doing well with regard to his wound on the left lower extremity. He's been tolerating the dressing changes without complication including the compression wrap in the wound appears to be significantly smaller today which is great news. Overall very pleased in this regard. 01/29/2019 on evaluation today patient appears to be doing well with regard to his left posterior lower extremity ulcer. He has been tolerating the dressing changes without complication. This is not completely healed but is getting much closer. We did order a Farrow wrap 4000 for him he has received this and has it with him today although I am not sure we are quite ready to start him on that as of yet. We are very close. 02/05/2019 on evaluation today patient actually appears to be doing quite well with regard to his left posterior lower extremity ulcer. He still has a very tiny opening remaining but the fortunate thing is he seems to be healing quite nicely. He also did get his Farrow wrap which I am hoping will help with his edema control as well at home. Fortunately there is no evidence of active infection. 02/12/2019 patient and fortunately appears to be doing poorly in regard to his wounds of the left lower extremity. He was very close to healing therefore we attempted to use his Velcro compression wraps continuing with lymphedema pumps at home. Unfortunately that does not seem to have done very well for him. He tells me that he wore them all the time but again I am not sure why if that is the case that he is having such significant edema. He is still on his fluid pills as well. With that being said there is no obvious sign of infection although I do wonder about the possibility of infection at this time as well. 02/19/2019 unfortunately upon evaluation today patient appears to be doing more poorly with regard to his left lower extremity. He is not showing signs of significant improvement and I  think the biggest issue here is that he does have an infection that appears to likely be Pseudomonas. That is based on the blue-green drainage that were noted at this time. Unfortunately the antibiotic that has been on is not going to take care of this at all. I think they will get a need to switch him to either Levaquin or Cipro and this was discussed with the patient. 02/26/2019 on evaluation today patient's lower extremity on the left appears to be doing significantly better as compared to last evaluation. Fortunately there is no signs of active infection at this time. He has been tolerating the compression wrap without complication in  fact he made it the whole week at this point. He is showing signs of excellent improvement I am very happy in this regard. With that being said he is having some issues with infection we did review the results of his culture which I noted today. He did have a positive finding for Enterobacter as well as Alcaligenes faecalis. Fortunately the Levaquin that I placed him on will work for both which is great news. There is no signs of systemic infection at this point. 10/30; left posterior leg wound in the setting of very significant edema and what looks like chronic venous inflammation. He has compression pumps but does not use them. We have been using 3 layer compression. Silver alginate to the wound as the primary dressing 03/18/2019 on evaluation today patient appears to be doing a little better compared to last time I saw him. He really has not been using his compression pumps he tells me that he is having too much discomfort. He has been keeping his wraps on however. He is only been taking his fluid pills every other day because he states they are not really helping and he has an appointment with his primary care provider at the [facility] tomorrow. Subsequently the wound itself on the left lower extremity does seem to be greatly improved compared to previous. 03/25/2019 on  evaluation today patient appears to be doing better with regard to his wounds on the bilateral lower extremities. The left is doing excellent the right is also doing better although both still do show some signs of open wounds noted at this point unfortunately. Fortunately there is no signs of active infection at this time. The patient also is not really having any significant pain which is good news. Unfortunately there was some confusion with the referral on vascular disease and as far as getting the patient scheduled there can be contacting him later today to do this LOUKAS, ANTONSON ([id number]) fortunately we got this straightened out. 04/01/2019 on evaluation today patient appears to be doing no fevers, chills, nausea, vomiting, or diarrhea. Excellent at this time with regard to his lower extremities. There does not appear to be any open wound at this point which is good news. Fortunately is also no signs of active infection at this time. Overall feel like the patient has done excellent with the compression the problem is every time we got him to this point and then subsequently go to using his own compression things just go right back to where they were. I am not sure how to address this we can try to get an appointment with vascular for 2 weeks now they have yet to call him. Obviously this has become frustrating for the patient as well. I think the issue has just been an honest error as far as scheduling is concerned but nonetheless still worn out the point where I am unsure of which direction we should take. 04/08/2019 on evaluation today patient actually appears to be doing well with regard to his lower extremities. There are no open wounds at this time and things seem to be managing quite nicely as far as the overall edema control is concerned. With that being said he does have his compression socks today for [place] to go ahead and reinitiate therapy in that manner at this point. He is going to be  going for shoes to be measured on Wednesday and then coincidentally he will also be seeing vascular on Thursday. Overall I think this is good news and  again I am hopeful that they will be able to do something for him to help prevent ongoing issues with edema control as well. No fevers, chills, nausea, vomiting, or diarrhea. 04/11/2019 on evaluation today patient actually appears to be doing poorly after just being discharged on Monday of this week. He had been experiencing issues with again blisters especially on the left lower extremity. With that being said he was completely healed and appeared to be doing great this past Monday. He then subsequently has new blisters that formed before his appointment with vascular this morning. He was also measured for shoes in the interim. With that being said we may have figured out what exactly is going on and why he continues to have issues like what we are seeing at this point. He takes his compression stockings off at nighttime and then he ends up having to sleep in his chair for 5-6 hours a night. He sleeps with his feet down he cannot really get him up in the recliner and therefore he is sleeping and the worst possible his position with his feet on the floor for that majority of the time. Again as I explained to him that is about one third at minimum at least one fourth of his day that he spending with his feet dangling down on the ground and the worst possible position they could be. I think this may be what is causing the issue. Subsequently I am leaning toward thinking that he may need a hospital bed in order to elevate his legs. We likely can have to coordinate this with his primary care provider at the [facility]. Readmission: 01/26/2021 this is a patient who presents for repeat evaluation here in the clinic although it is actually been couple of years since have seen him in fact it was December 2020 when I last saw him. Subsequently he never really  healed but did end up being lost to follow-up. He tells me has been having issues ongoing with his lower extremities has bilateral lower extremity lymphedema no real significant or definitive open wounds but in general his lymphedema is way out of control. We were never able to refer him to lymphedema clinic simply due to the fact to be honest we were never able to get him completely healed. I do not see anyone with open wounds. The patient does have evidence of type 2 diabetes mellitus, lymphedema, chronic venous insufficiency, and hypertension. That really has not changed since his last evaluation. 02/09/2021 upon evaluation today patient appears to be doing a little better in regard to his legs although he still having a tremendous amount of drainage especially on the left leg. Fortunately there does not appear to be any evidence of active infection. Of note when we looked into this further it appears that the patient did not have any absorptive dressing on it was just the 4-layer compression wrap. Nonetheless this is probably big part of the issue here. 10/10; he comes in today with 3 large areas on the upper right lower leg likely remanence of denuded blistering under his compression wraps. He has no other wounds on the right. On the left he has the denuded area on the left medial foot and ankle and on the left dorsal foot. Massive lymphedema in both feet dorsally. Using Zetuvit under compression We have increased home health visitation to twice a week to change the dressings and will change it once 02/22/2021 upon evaluation today patient appears to be doing well currently  with regard to his wounds. He has been tolerating the dressing changes without complication. Fortunately there does not appear to be any evidence of active infection which is great news. No fevers, chills, nausea, vomiting, or diarrhea. The biggest issue I see currently is that home health is not putting any medicine on the  actual wounds before wrapping. 03/01/2021 upon evaluation today the patient's right leg actually appears to be doing quite well which is great news there does not appear to be any evidence of active infection at this time. No fevers, chills, nausea, vomiting, or diarrhea. With that being said the patient is having issues on the left foot where he is having significant drainage is also an ammonia smell he does not have any animals at home and this makes me concerned about a bacteria producing urea as a byproduct. Again the possible common organisms will be E. coli, Proteus, and Enterococcus. All 3 of which can be successfully treated with Levaquin. For that reason I think that this may be a good option for [place] to consider placing him on and I did obtain a culture as well for confirmation sake. 03/08/2021 upon evaluation today patient appears to be doing unfortunately still somewhat poorly in regard to his leg ulcerations. He actually has an area on the right leg where he blistered due to the fact that his wrap slid down and caused an area of pinching on his skin and this has led to a significant issue here. 03/15/2021 upon evaluation today patient unfortunately has not been wrapped appropriately with absorptive dressings nor with the appropriate technique for the third layer of the 4-layer compression wrap. These are issues that we continue to try to address with the home health nurse. Also the absorptive dressing that she had was cut in half and therefore that causes things to leak out it does not actually trap the fluid in regard to the top of the foot overall I think that all these combined are really not seeing things improved significantly here. Fortunately there does not appear to be any signs of significant infection at this time which is good news. He still is having a tremendous amount of drainage. 03/22/2021 upon evaluation today patient appears to be draining tremendously. He still continues  to tell me that he is using his pumps 2 times a day and that coupled with that tells me that he is elevating his legs as well. With that being said all things considered I am really just not seeing the improvement we would expect to see with the 4-layer compression wrap and all the above noted. He in fact had an extremely large Zetuvit dressing on both legs and that they were extremely filled to the max with fluid. This is after just being changed just before the weekend and this is Monday. Nonetheless I am concerned about the fact that there is something going on fairly significant that we cannot get any of this under control and that he is draining this significantly. He supposed be having an echocardiogram it sounds like scheduling has been an issue for him as far as getting in sooner. Its something to do with needing his cousin to drive him because of where it sat and he cannot drive himself to this appointment either way I really think he needs to try to see what he can do about making this happen a little sooner. He tells me he will call today. MIKYLE, SOX ([id number]) 03/29/2021 on evaluation today patient appears to be  doing about the same in regard to his legs. He did get his cardiology appointment moved up to 6 December which is at least good that is better than what it was before mid December. Overall very pleased in that regard. 04/05/2021 upon evaluation today patient unfortunately is still doing fairly poorly. There does not appear to be any signs of active infection at this time. No fevers, chills, nausea, vomiting, or diarrhea. Unfortunately I think until his edema is under control and overall fluid overload there is really not to be much chance that I can do much to get him better. This is quite unfortunate and frustrating both for myself and the patient to be perfectly honest. Nonetheless I think that he really needs to have a conversation both with his primary care provider as well  as cardiologist he sees the PCP on Monday and cardiology on Wednesday of next week. 04/13/2021 upon evaluation today patient appears to be doing poorly in regard to his bilateral lower extremities his left is still worse than the right. With that being said he has a tremendous amount of drainage he did see his primary care provider yesterday there really was not much there to be done from their perspective. He sees cardiology tomorrow. Nonetheless my biggest concern here is simply that if we do not get the edema under control he is going to continue to have drainage and honestly I think at some point he is going to become infected severely that is my main concern. 04/19/2021 upon evaluation today patient appears to be doing poorly still in regard to his legs. Unfortunately there does not appear to be any signs of infection at this point. He does have a tremendous amount of drainage however. We have not seen the results back from the cardiologist and the echocardiogram that was done. It appears that the patient checked out okay as far as that is concerned with regard to ejection fraction though we still have some issues here to be honest with his diastolic function. I am unsure if this is accounting for everything that we are seeing or not. Either way he has a tremendous amount of drainage from his legs that we are just not able to control in the outpatient setting at this point. I have reached out to Dr. Rockey Situ his cardiologist to see once he reviews the sheet if there is anything that he feels like can be done from an outpatient perspective if not then I think the way to go is probably can to be through inpatient admission and diuresis. Otherwise I am not sure how working to get this under control we tried antibiotics, compression wrapping, and I have told the patient to be elevating his legs I am not sure how much he does of this but either way I think that this is still an ongoing issue  nonetheless. 04/26/2021 upon evaluation today patient appears to be doing poorly in regard to his legs. He is having a tremendous amount of fluid at this point which is quite unfortunate. Its to the point that he may have had at least 5 to 10 pounds of fluid in his dressings this morning when they were removed these were changed this Friday. Subsequently I think he needs to go to the ER for further evaluation and treatment I think is probably can need diuresis possibly even IV antibiotics been on what the blood work looks like but in general I feel like he needs something to get this under control from  an outpatient perspective absent of everywhere I can think of and I cannot get this under control with our traditional measures. I think this is going require more so that we can get him better 12/30; this is a patient with severe bilateral lymphedema. He was hospitalized from 04/26/2021 through 04/29/2021 treated for cellulitis in the setting of lower extremity ulcers and lymphedema. After he left the hospital he is apparently seen for nurse visit our staff contacted cardiology and he has been started on Lasix 40 mg. Apparently his legs have less edema. Lab work from 05/04/2021 showed a BUN of 38 and creatinine of 1.59 these are elevated versus previous where his creatinine seems to have been 1.30 on 12/19 his potassium is 4.3. I believe the lab work is being followed by cardiology We have him in a 4-layer wrap. Xeroform on the leg wounds and sit to fit on the Berry damage skin on the left dorsal foot versus right dorsal foot. He has compression pumps but does not use them. We have apparently not yet ordered him compression stockings 05/17/2021 upon evaluation today patient's legs though better than last time I personally saw him appear to be getting worse compared to where they were previous. Dr. Quentin Cornwall was actually last 1 to see you I have not seen him since 19 December. That was before he went into  the ER. Coming out apparently his legs looked also and they still look better but not as good as they were in the past. 1/16; patient with severe bilateral lymphedema. Severe scaled hyperkeratotic skin on the dorsal aspect of his distal left foot and left medial ankle.. On the right side changes are not as bad. He did not have any weeping edema. Our intake nurse was convinced that he is being compliant with compression pumps 1 hour twice a day 05/31/2021 upon evaluation today patient actually appears to be doing a little bit better in my opinion in regard to his feet. I do not see as much drainage and it being just completely wet as it was previous. Fortunately I do not also see any signs of active infection which is great news as well. 06/07/21 Upon inspection patient's wound bed actually showed signs of doing well he is not nearly as weepy and wet as he has been in the past and overall very pleased in that regard. Fortunately I do not see any signs of active infection locally or nor systemically at this time. Which is great news. No fevers, chills, nausea, vomiting, or diarrhea. 06/14/2021 upon evaluation today patient appears to be doing well with regard to his right foot I am pleased in that regard. His left foot is still draining quite a bit despite using lymphedema pumps, 4-layer compression wraps, and he tells me elevating his legs as well. He also has Lasix that he takes twice a day. Nonetheless I believe that this is still good to be an ongoing issue. We have a hard time getting this under control as far as the swelling is concerned. 06/21/2021 upon evaluation today patient appears to be doing decently well in regard to his wounds all things considered. He still has a tremendous amount of drainage and fluid noted at this point. Fortunately I do not see any signs of active infection locally or systemically at this point which is great news. Nonetheless I am unsure where to go and how to do this  as far as trying to limit his swelling and weeping from his toes in particular.  06/28/2021 upon evaluation patient unfortunately continues to have significant drainage from his feet. We have been keeping him in a compression wrap and despite this he still continues to have extreme fluid issues he seen his cardiologist he is seeing the nephrologist. We really cannot find any way to get this under control when he did well was when he was in the hospital and they got some of the fluid away. But outside of that we are just struggling to achieve the long-term goal of getting this under control and keeping it under control unfortunately. 07/05/2021 upon evaluation today patient appears to be doing poorly in regard to his feet. Unfortunately this continues to be a significant issue and to be honest I am really not certain what to do about it. I referred him to Dr. Randol Kern at [facility] and he does have an appointment although it is 12 April. He also sees his primary care provider on 6 April. He did not want to see Dr. Haynes Kerns until after he saw his PCP that is the reason the appointment so far out. There is really not much I can do in that regard. Nonetheless I do think that we are still continue to have significant lymphedema issues with significant mount of weeping in regard to the feet and again this has just become extremely difficult to manage to be honest I am not sure if there is something else that Dr. Haynes Kerns or someone else could recommend he also will be seeing Dr. Dellia Nims in 2 weeks when I am on vacation and at that time I will see if Dr. Dellia Nims has any ideas about where to go from here in the meantime. JOSIE, MESA ([id number]) 07/12/2021 upon evaluation today patient appears to be doing about as well as can be expected with regard to his feet. He does actually see his kidney doctor this Friday. He also will be seeing his primary care provider on April 4 and then following that around mid April he will be  seeing Dr. Haynes Kerns at [facility] which was a referral made for him. Again my goal is to try to find out some way to fix this and to be perfectly honest we have had some issues with making any good adjustments. When he was in the hospital and greater amounts of Lasix he was able to get this down and it looked much better upon discharge. With that being said right now things just are not doing nearly as good as what they used to be. 3/13; patient presents for follow-up. He has been using his lymphedema pumps over the past week. He reports an increase in his Lasix dose. He has no issues or complaints today. He denies signs of infection. 07/26/2021 upon evaluation today patient appears to be doing better in regard to his feet bilaterally. Both are showing signs of much less drainage which is great news and overall very pleased in that regard. Fortunately there does not appear to be any evidence of active infection locally or systemically at this time. No fevers, chills, nausea, vomiting, or diarrhea. 08/02/2021 upon evaluation today patient appears to be doing well with regard to his feet. Both are showing signs of being drier the right pretty much has not really draining much at all which is great news. The left is not draining anywhere close to his much as it was during the last evaluation. This is excellent news and overall very pleased. 08/09/2021 upon evaluation today patient appears to be doing well with regard to  his legs the right leg especially showing signs of excellent improvement which is great news I do not see any evidence of active infection locally or systemically which is great. In regard to the left leg he still has some weeping and drainage but nothing as significant as what it was in the past this is great news. 08-16-2021 upon evaluation today patient appears to actually be doing quite well in my opinion in regard to his feet. This is significantly improved compared to what we previously seen  and overall I am extremely pleased in that regard. I do believe that He is actually improving although this is obviously very slow going. 08-23-2021 upon evaluation today patient appears to be doing well currently in regard to his right leg which actually is pretty dry at this point today. Fortunately I do not see any evidence of active infection at this time which is great news. No fevers, chills, nausea, vomiting, or diarrhea. 08-30-2021 upon evaluation today patient appears to be doing well with regard to his lower extremities. The right foot is pretty much completely dry which is great news the left foot though not completely dry seems to be doing decently well. I do not see any signs of active infection locally or systemically which is great news. No fevers, chills, nausea, vomiting, or diarrhea. 09-06-2021 upon evaluation today patient appears to be doing well with regard to his feet in fact now the left foot is almost completely dry as well and I am definitely seeing a lot of significant improvement. With that being said unfortunately he actually appears to have some cellulitis of his right thigh. His toes are also little bit red but this may just be due to the increased swelling. He really is not warm to touch in regard to the toes. 5/8; excellent edema control on the right foot and lower leg there is no open wounds but we continue to put compression on this otherwise this will breakdown. He is using his compression pumps twice a day at home The area that is problematic is on the left dorsal foot some areas that are not fully epithelialized with very dry fissured skin over this area. 09-20-2021 upon evaluation today patient appears to be doing a little bit worse in regard to swelling at this time. Fortunately I do not see any signs of infection with that being said the wrap was not on quite as well as what I would like to have seen. I do believe that this has caused a little bit of excess swelling  and again we need to try to get this under good control. 09-27-2021 upon evaluation today patient appears to be doing awesome in regard to his feet and legs. Everything is measuring smaller the swelling is down and to be perfectly honest I am extremely pleased with the end of his feet especially on the left side and how dry this is today. I do think we are on the right track here. 5/31; patient presents for follow-up. He is using nystatin powder to the feet bilaterally under 4-layer compression. He has no issues or complaints today. He states he is going to the lymphedema clinic tomorrow. Since he has no open wounds on the right lower extremity they will be focused on the side. 10-11-2021 upon evaluation today patient appears to be doing excellent in regard to his legs. He has been tolerating the dressing changes without complication. Fortunately there does not appear to be any evidence of active infection locally or  systemically at this time which is great news. No fevers, chills, nausea, vomiting, or diarrhea. 10-18-2021 upon evaluation today patient appears to be doing well with regard to his legs. He has been tolerating the dressing changes without complication and actually seen in lymphedema clinic now in regard to his right leg were taken care of the left leg. Fortunately I do not see any evidence of active infection locally or systemically at this time. 10-26-2021 upon evaluation today patient's wounds actually are showing signs of doing well in fact he really does not have wounds as much is weeping of the lower extremities. Fortunately I do not see any evidence of active infection locally or systemically which is great news. No fever or chills noted 11-02-2021 upon evaluation today patient appears to be doing decently well in regard to his legs. Of actually been on the phone quite a bit discussing with [facility] orthotics custom compression for him. I also have been discussing everything with his  lymphedema clinic provider as well. Helene Kelp notes that there is really not much more that she can do for him which was noted last week as well. Subsequently I do believe that the patient would benefit from getting custom compression in fact I think that is what he is going require to keep anything under control here. He voiced understanding. 11-08-2021 upon evaluation today patient appears to be doing excellent in regard to his legs and feet. Fortunately I do not see any signs of infection at this time which is great news. No fever or chills noted again I am still been working on trying to figure out what exactly we need to do as far as getting the [place] involved in coverage for custom compression for this patient. I also discussed with him that he does need to have custom shoes which I completely understand. With that being said it does make it difficult thinking about what kind of combination of shoes and compression is going to be best for him for the long-term. Fortunately I do not see any evidence of active infection locally or systemically which is great news. Overall I think you are doing quite well. 11-16-2021 upon evaluation today patient appears to be doing well currently in regard to his legs and feet. Everything is significantly smaller Troy, [name] ([id number]) compared to what has been. Fortunately I do not see any evidence of active infection locally or systemically at this time which is great news. 11-23-2021 upon evaluation today patient appears to be doing well with regard to his legs bilaterally. We can actually initiate treatment with a recommendation today to utilize Tubigrip and try to see if we can get this under better control. The patient is in agreement with the plan. Nonetheless the big question is whether this will be something that he can utilize at home in order to keep his swelling down and allow him to be able to function without having to come into the clinic as frequently.  He notes that he would love to get to this point. He does have a cousin who is willing to help him with change out the Tubigrip when necessary. 7/25; the patient does not have any open wounds however his left leg is a lot more swollen than it was last week. His cousin came in who presumably would be the one to change his juxta lites to practice on doing this for the patient. She apparently lives in the next block. In the meantime he has been using Tubigrip 12-07-2021 upon  evaluation today patient appears to be doing well with regard to his legs although we are transitioning to try to get him to use the Tubigrip which again has been a little bit of a setback but not too much overall I think he is still doing well he still able to wear his current shoes which is good news. He does have a cousin who is willing to help him with the Tubigrip and we are training her how to do this as well. She is here today. 12-14-2021 upon evaluation today patient appears to be doing well currently in regard to his right leg which I think is doing excellent with the Tubigrip. With regard to the left leg unfortunately this is not doing nearly as well and will get me to do something to intervene here. I think getting back into a 4-layer wraps can be the way to go short-term To get this back under control and the patient voiced understanding. 12-21-2021 upon evaluation today patient appears to be tolerating the dressing changes currently without complication the wrap of the left leg is doing well the right leg reason Tubigrip. The wound is looking significantly improved. 12-28-2021 upon evaluation today patient appears to be doing well currently in regard to his wound. He has been tolerating the dressing changes without complication. Fortunately there is no evidence of active infection locally or systemically at this time. I think he is very close to complete resolution on the left leg and I think we are ready to go back to the  Tubigrip. 01-04-2022 upon evaluation today patient appears to be doing okay in regard to his legs of the more swollen because he was rolling and pushing the Tubigrip down because it felt "too tight". With that being said I discussed with the patient today that we really do need to keep this pulled up and if he keeps it pulled up and when it starts to feel tight gets to where he can elevate his legs I think that he can keep this under control. I would suggest if it starts feeling tight that he lay down where he can elevate his legs above heart level to basically help the swelling to go down is much as possible. He voiced understanding. I also told him that is can to be his job to keep these pulled up and that if he is not able to do this I [name] [name] probably have to refer him elsewhere to get a second opinion as I have pretty much done everything I can try to get his legs under control and keep them under control he is much better than where he used to be and his feet can actually fit in his shoes which is great but in general I just do not know what else to do he has his cousin helping with putting the Tubigrip's on which is awesome and a great resource for him but again she cannot be there to keep them pulled up and nor is that even her job. Electronic Signature(s) Signed: 01/04/2022 11:35:05 AM By: [name] Keeler PA-C Entered By: [name] on 01/04/2022 11:35:05 [name] ([id number]) -------------------------------------------------------------------------------- Physical Exam Details Patient Name: PACEN, WATFORD Date of Service: 01/04/2022 11:00 AM Medical Record Number: [id number] Patient Account Number: [id number] Date of Birth/Sex: [birth year] (71 y.o. M) Treating RN: [name] Primary Care Provider: Fontaine No Other Clinician: Referring Provider: Fontaine No Treating Provider/Extender: Skipper Cliche in Treatment: 51 Constitutional Well-nourished and  well-hydrated in no acute distress. Respiratory  normal breathing without difficulty. Psychiatric this patient is able to make decisions and demonstrates good insight into disease process. Alert and Oriented x 3. pleasant and cooperative. Notes Upon inspection patient's wound is just very tiny on the anterior portion of the left ankle region. There does not appear to be any evidence of infection here and I think that is almost completely closed with that being said it still has a little bit of healing left to go. With regard to the remainder of the legs the upper portion of the legs are much more swollen due to not keeping the Tubigrip pulled up other than that he seems to be doing decently well here. Electronic Signature(s) Signed: 01/04/2022 11:35:32 AM By: [name] Keeler PA-C Entered By: [name] on 01/04/2022 11:35:32 [name] ([id number]) -------------------------------------------------------------------------------- Physician Orders Details Patient Name: [name] Date of Service: 01/04/2022 11:00 AM Medical Record Number: [id number] Patient Account Number: [id number] Date of Birth/Sex: [birth year] (71 y.o. M) Treating RN: [name] Primary Care Provider: Fontaine No Other Clinician: Referring Provider: Fontaine No Treating Provider/Extender: Skipper Cliche in Treatment: 60 Verbal / Phone Orders: No Diagnosis Coding Follow-up Appointments o Return Appointment in 1 week. o Nurse Visit as needed Bathing/ Shower/ Hygiene o May shower with wound dressing protected with water repellent cover or cast protector. o No tub bath. Anesthetic (Use 'Patient Medications' Section for Anesthetic Order Entry) o Lidocaine applied to wound bed Edema Control - Lymphedema / Segmental Compressive Device / Other Bilateral Lower Extremities o 4 Layer Compression System Lymphedema. - left side with nystatin between toes and silver cell to any weeping  areas with tubi c double layer to ankle/foot o Tubigrip double layer applied - Nystatin powder between toes double size d bilat just behind toes to patella knotch , double size C foot ankles enclose toes to just above ankle scell to any weeping areas o Elevate, Exercise Daily and Avoid Standing for Long Periods of Time. o Elevate legs to the level of the heart and pump ankles as often as possible o Elevate leg(s) parallel to the floor when sitting. o Compression Pump: Use compression pump on left lower extremity for 60 minutes, twice daily. - 2 times per day o DO YOUR BEST to sleep in the bed at night. DO NOT sleep in your recliner. Long hours of sitting in a recliner leads to swelling of the legs and/or potential wounds on your backside. o Other: - Contact prescriber regarding use of diuretics to reduce fluid overload. Off-Loading o Turn and reposition every 2 hours Additional Orders / Instructions o Follow Nutritious Diet and Increase Protein Intake Medications-Please add to medication list. o P.O. Antibiotics Electronic Signature(s) Signed: 01/04/2022 4:37:11 PM By: [name] Keeler PA-C Signed: 01/06/2022 8:44:03 PM By: [name] RN Entered By: [name] on 01/04/2022 11:22:19 [name] ([id number]) -------------------------------------------------------------------------------- Problem List Details Patient Name: [name] Date of Service: 01/04/2022 11:00 AM Medical Record Number: [id number] Patient Account Number: [id number] Date of Birth/Sex: [birth year] (71 y.o. M) Treating RN: [name] Primary Care Provider: Fontaine No Other Clinician: Referring Provider: Fontaine No Treating Provider/Extender: Skipper Cliche in Treatment: 82 Active Problems ICD-10 Encounter Code Description Active Date MDM Diagnosis E11.622 Type 2 diabetes mellitus with other skin ulcer 01/26/2021 No Yes I89.0 Lymphedema, not elsewhere classified  01/26/2021 No Yes I87.2 Venous insufficiency (chronic) (peripheral) 01/26/2021 No Yes L97.822 Non-pressure chronic ulcer of other part of left lower leg with fat layer 01/26/2021 No Yes exposed L97.512 Non-pressure chronic ulcer  of other part of right foot with fat layer 01/26/2021 No Yes exposed [place] (primary) hypertension 01/26/2021 No Yes L97.811 Non-pressure chronic ulcer of other part of right lower leg limited to 02/15/2021 No Yes breakdown of skin Inactive Problems Resolved Problems Electronic Signature(s) Signed: 01/04/2022 11:13:58 AM By: [name] Keeler PA-C Entered By: [name] on 01/04/2022 11:13:58 [name] ([id number]) -------------------------------------------------------------------------------- Progress Note Details Patient Name: [name] Date of Service: 01/04/2022 11:00 AM Medical Record Number: [id number] Patient Account Number: [id number] Date of Birth/Sex: Nov 16, 1950 (71 y.o. M) Treating RN: [name] Primary Care Provider: Fontaine No Other Clinician: Referring Provider: Fontaine No Treating Provider/Extender: Skipper Cliche in Treatment: 75 Subjective Chief Complaint Information obtained from Patient Left LE ulcers History of Present Illness (HPI) 71 year old male who presented to the ER with bilateral lower extremity blisters which had started last week. he has a past medical history of leukemia, diabetes mellitus, hypertension, edema of both lower extremities, his recurrent skin infections, peripheral vascular disease, coronary artery disease, congestive heart failure and peripheral neuropathy. in the ER he was given Rocephin and put on Silvadene cream. he was put on oral doxycycline and was asked to follow-up with the [facility]. His last hemoglobin A1c was 6.6 in December and he checks his blood sugar once a week. He does not have any physicians outside the [place] system. He does not recall any vascular duplex  studies done either for arterial or venous disease but was told to wear compression stockings which he does not use 05/30/2016 -- we have not yet received any of his notes from the [facility] hospital system and his arterial and venous duplex studies are scheduled here in [place] around mid February. We are unable to have his insurance accepted by home health agencies and hence he is getting dressings only once a week. 06/06/16 -- -- I received a call from the patient's PCP at the [facility] at [facility] and spoke to Dr. Garvin Mooney, phone number [phone] and fax number [phone]. She confirmed that no vascular testing was done over the last 5 years and she would be happy to do them if the patient did want them to be done at the [place] and we could fax him a request. Readmission: 71 year old male seen by as in February of this year and was referred to vein and vascular for studies and opinion from the vascular surgeons. The patient returns today with a fresh problem having had blisters on his left lower extremity which have been there for about 5 days and he clearly states that he has been wearing his compression stockings as advised though he could not read the moderate compression and has been wearing light compression. Review of his electronic medical records note that he had lower extremity arterial duplex examination done on 06/23/2016 which showed no hemodynamically significant stenosis in the bilateral lower extremity arterial system. He also had a lower extremity venous reflux examination done on 07/07/2016 and it was noted that he had venous incompetence in the right great saphenous vein and bilateral common femoral veins. Patient was seen by Dr. Tamala Mooney on the same day and for some reason his notes do not reflect the venous studies or the arterial studies and he recommended patient do a venous duplex ultrasound to look for reflux and return to see him.he would also consider a lymph pump if  required. The patient was told that his workup was normal and hence the patient canceled his follow-up appointment. 02/03/17 on evaluation today  patient left medial lower extremity blister appears to be doing about the same. It is still continuing to drain and there's still the blistered skin covering the wound bed which is making it difficult for the alternate to do its job. Fortunately there is no evidence of cellulitis. No fevers chills noted. Patient states in general he is not having any significant discomfort. Patient's lower extremity arterial duplex exam revealed that patient was hemodynamically stable with no evidence of stenosis in regard to the bilateral lower extremities. The lower extremity venous reflux exam revealed the patient had venous incontinence noted in the right greater saphenous and bilateral common femoral vein. There is no evidence of deep or superficial vein thrombosis in the bilateral lower extremities. Readmission: 11/12/18 Patient presents for evaluation our clinic today concerning issues that he is having with his left lower extremity. He tells me that a couple weeks ago he began developing blisters on the left lower extremity along with increased swelling. He typically wears his compression stockings on a regular basis is previously been evaluated both here as well is with vascular surgery they would recommend lymphedema pumps but unfortunately that somehow fell through and he never heard anything back from that. Nonetheless I think lymphedema pumps would be beneficial for this patient. He does have a history of hypertension and diabetes. Obviously the chronic venous stasis and lymphedema as well. At this point the blisters have been given in more trouble he states sometimes when the blisters openings able to clean it down with alcohol and it will dry out and do well. Unfortunately that has not been the case this time. He is having some discomfort although this mean these  with cleaning the areas he doesn't have discomfort just on a regular basis. He has not been able to wear his compression stockings since the blisters arose due to the fact that of course it will drain into the socks causing additional issues and he didn't have any way to wrap this otherwise. He has increased to taking his Lasix every day instead of every other day. He sees his primary care provider later this month as well. No fevers, chills, nausea, or vomiting noted at this time. 11/19/18-Patient returns at 1 week, per intake RN the amount of seepage into the compression wraps was definitely improved, overall all the wounds are measuring smaller but continuing silver alginate to the wounds as primary dressing 11/26/18 on evaluation today patient appears to be doing quite well in regard to his left lower [place] ulcers. In fact of the areas that were noted initially he only has two regions still open. There is no evidence of active infection at this time. He still is not heard anything from the company regarding lymphedema pumps as of yet. Again as previously seen vascular they have not recommended any surgical intervention. PAO, HAFFEY ([id number]) 12/03/2018 on evaluation today patient actually appears to be doing quite well with regard to his lower extremity ulcers. In fact most of the areas appear to be healed the one spot which does not seem to be completely healed I am unsure of whether or not this is really draining that much but nonetheless there does not appear to be any signs of infection or significant drainage at this point. There is no sign of fever, chills, nausea, vomiting, or diarrhea. Overall I am pleased with how things have progressed I think is very close to being able to transition to his home compression stockings. 12/10/2018 upon evaluation today patient appears to  be doing quite well with regard to his left lower extremity. He has been tolerating the dressing changes without  complication. Fortunately there is no signs of active infection at this time. He appears after thorough evaluation of his leg to only have 1 small area that remains open at this point everything else appears to be almost completely closed. He still have significant swelling of the left lower extremity. We had discussed discussing this with his primary care provider he is not able to see her in person they were at the [facility] and right now the [place] is not seeing patients on site. According to the patient anyway. Subsequently he did speak with her apparently and his primary care provider feels that he may likely have a DVT. With that being said she has not seen his leg she is just going off of his history. Nonetheless that is a concern that the patient now has as well and while I do not feel the DVT is likely we can definitely ensure that that is not the case I will go ahead and see about putting that order in today. Nonetheless otherwise I am in a recommend that we continue with the current wound care measures including the compression therapy most likely. We just need to ensure that his leg is indeed free of any DVTs. 12/17/2018 on evaluation today patient actually appears to be completely healed today. He does have 2 very small areas of blistering although this is not anything too significant at this point which is good news. With that being said I am in agreement with the fact that I think he is completely healed at this point. He does want to get back into his compression stocking. The good news is we have gotten approval from insurance for his lymphedema pumps we received a letter since last saw him last week. The other good news is his study did come back and showed no evidence of a DVT. 12/20/2018 on evaluation today patient presents for follow-up concerning his ongoing issues with his left lower extremity. He was actually discharged last Friday and did fairly well until he states blisters opened  this morning. He tells me he has been wearing his compression stocking although he has a hard time getting this on. There does not appear to be any signs of active infection at this time. No fevers, chills, nausea, vomiting, or diarrhea. 12/27/2018 on evaluation today patient appears to be doing very well with regard to his swelling of the left lower extremity the 4 layer compression wrap seems to have been beneficial for him. Fortunately there is no signs of active infection at this time. Patient has been tolerating the compression wrap without complication and his foot swelling in particular appears to be greatly improved. He does still have a wound on the lateral portion of his left leg I believe this is more of a blister that has now reopened. 01/03/2019 on evaluation today patient actually appears to be doing excellent in regard to his left lower extremity. He did receive his compression pumps and is actually use this 7 times since he was last here in the office. On top of the compression wrap he is now roughly 3 cm better at the calf and 2 cm better at the ankle he also states that his foot seem to go an issue better without even having to use a shoe horn. Obviously I think this is all evidence that he is doing excellent in this regard. The other  good news is he does not appear to have anything open today as far as wounds are concerned. 01/15/2019 on evaluation today patient appears to be doing more poorly yet again with regard to his left lower extremity. He has developed new wounds again after being discharged just recently. Unfortunately this continues to be the case that he will heal and then have subsequent new wounds. The last time I was hopeful that he may not end up coming back too quickly especially since he states he has been using his lymphedema pumps along with wearing his compression. Nonetheless he had a blister on the back of his leg that popped up on the left and this has opened up  into an ulceration it is quite painful. 01/22/19 on evaluation today patient actually appears to be doing well with regard to his wound on the left lower extremity. He's been tolerating the dressing changes without complication including the compression wrap in the wound appears to be significantly smaller today which is great news. Overall very pleased in this regard. 01/29/2019 on evaluation today patient appears to be doing well with regard to his left posterior lower extremity ulcer. He has been tolerating the dressing changes without complication. This is not completely healed but is getting much closer. We did order a Farrow wrap 4000 for him he has received this and has it with him today although I am not sure we are quite ready to start him on that as of yet. We are very close. 02/05/2019 on evaluation today patient actually appears to be doing quite well with regard to his left posterior lower extremity ulcer. He still has a very tiny opening remaining but the fortunate thing is he seems to be healing quite nicely. He also did get his Farrow wrap which I am hoping will help with his edema control as well at home. Fortunately there is no evidence of active infection. 02/12/2019 patient and fortunately appears to be doing poorly in regard to his wounds of the left lower extremity. He was very close to healing therefore we attempted to use his Velcro compression wraps continuing with lymphedema pumps at home. Unfortunately that does not seem to have done very well for him. He tells me that he wore them all the time but again I am not sure why if that is the case that he is having such significant edema. He is still on his fluid pills as well. With that being said there is no obvious sign of infection although I do wonder about the possibility of infection at this time as well. 02/19/2019 unfortunately upon evaluation today patient appears to be doing more poorly with regard to his left lower  extremity. He is not showing signs of significant improvement and I think the biggest issue here is that he does have an infection that appears to likely be Pseudomonas. That is based on the blue-green drainage that were noted at this time. Unfortunately the antibiotic that has been on is not going to take care of this at all. I think they will get a need to switch him to either Levaquin or Cipro and this was discussed with the patient. 02/26/2019 on evaluation today patient's lower extremity on the left appears to be doing significantly better as compared to last evaluation. Fortunately there is no signs of active infection at this time. He has been tolerating the compression wrap without complication in fact he made it the whole week at this point. He is showing signs of  excellent improvement I am very happy in this regard. With that being said he is having some issues with infection we did review the results of his culture which I noted today. He did have a positive finding for Enterobacter as well as Alcaligenes faecalis. Fortunately the Levaquin that I placed him on will work for both which is great news. There is no signs of systemic infection at this point. 10/30; left posterior leg wound in the setting of very significant edema and what looks like chronic venous inflammation. He has compression pumps but does not use them. We have been using 3 layer compression. Silver alginate to the wound as the primary dressing 03/18/2019 on evaluation today patient appears to be doing a little better compared to last time I saw him. He really has not been using his compression pumps he tells me that he is having too much discomfort. He has been keeping his wraps on however. He is only been taking his fluid pills every other day because he states they are not really helping and he has an appointment with his primary care provider at the [facility] tomorrow. Subsequently the wound itself on the left lower extremity  does seem to be greatly improved compared to previous. [name], [name] ([id number]) 03/25/2019 on evaluation today patient appears to be doing better with regard to his wounds on the bilateral lower extremities. The left is doing excellent the right is also doing better although both still do show some signs of open wounds noted at this point unfortunately. Fortunately there is no signs of active infection at this time. The patient also is not really having any significant pain which is good news. Unfortunately there was some confusion with the referral on vascular disease and as far as getting the patient scheduled there can be contacting him later today to do this fortunately we got this straightened out. 04/01/2019 on evaluation today patient appears to be doing no fevers, chills, nausea, vomiting, or diarrhea. Excellent at this time with regard to his lower extremities. There does not appear to be any open wound at this point which is good news. Fortunately is also no signs of active infection at this time. Overall feel like the patient has done excellent with the compression the problem is every time we got him to this point and then subsequently go to using his own compression things just go right back to where they were. I am not sure how to address this we can try to get an appointment with vascular for 2 weeks now they have yet to call him. Obviously this has become frustrating for the patient as well. I think the issue has just been an honest error as far as scheduling is concerned but nonetheless still worn out the point where I am unsure of which direction we should take. 04/08/2019 on evaluation today patient actually appears to be doing well with regard to his lower extremities. There are no open wounds at this time and things seem to be managing quite nicely as far as the overall edema control is concerned. With that being said he does have his compression socks today for [place] to go ahead  and reinitiate therapy in that manner at this point. He is going to be going for shoes to be measured on Wednesday and then coincidentally he will also be seeing vascular on Thursday. Overall I think this is good news and again I am hopeful that they will be able to do something for him to  help prevent ongoing issues with edema control as well. No fevers, chills, nausea, vomiting, or diarrhea. 04/11/2019 on evaluation today patient actually appears to be doing poorly after just being discharged on Monday of this week. He had been experiencing issues with again blisters especially on the left lower extremity. With that being said he was completely healed and appeared to be doing great this past Monday. He then subsequently has new blisters that formed before his appointment with vascular this morning. He was also measured for shoes in the interim. With that being said we may have figured out what exactly is going on and why he continues to have issues like what we are seeing at this point. He takes his compression stockings off at nighttime and then he ends up having to sleep in his chair for 5-6 hours a night. He sleeps with his feet down he cannot really get him up in the recliner and therefore he is sleeping and the worst possible his position with his feet on the floor for that majority of the time. Again as I explained to him that is about one third at minimum at least one fourth of his day that he spending with his feet dangling down on the ground and the worst possible position they could be. I think this may be what is causing the issue. Subsequently I am leaning toward thinking that he may need a hospital bed in order to elevate his legs. We likely can have to coordinate this with his primary care provider at the [facility]. Readmission: 01/26/2021 this is a patient who presents for repeat evaluation here in the clinic although it is actually been couple of years since have seen him in fact it  was December 2020 when I last saw him. Subsequently he never really healed but did end up being lost to follow-up. He tells me has been having issues ongoing with his lower extremities has bilateral lower extremity lymphedema no real significant or definitive open wounds but in general his lymphedema is way out of control. We were never able to refer him to lymphedema clinic simply due to the fact to be honest we were never able to get him completely healed. I do not see anyone with open wounds. The patient does have evidence of type 2 diabetes mellitus, lymphedema, chronic venous insufficiency, and hypertension. That really has not changed since his last evaluation. 02/09/2021 upon evaluation today patient appears to be doing a little better in regard to his legs although he still having a tremendous amount of drainage especially on the left leg. Fortunately there does not appear to be any evidence of active infection. Of note when we looked into this further it appears that the patient did not have any absorptive dressing on it was just the 4-layer compression wrap. Nonetheless this is probably big part of the issue here. 10/10; he comes in today with 3 large areas on the upper right lower leg likely remanence of denuded blistering under his compression wraps. He has no other wounds on the right. On the left he has the denuded area on the left medial foot and ankle and on the left dorsal foot. Massive lymphedema in both feet dorsally. Using Zetuvit under compression We have increased home health visitation to twice a week to change the dressings and will change it once 02/22/2021 upon evaluation today patient appears to be doing well currently with regard to his wounds. He has been tolerating the dressing changes without complication. Fortunately  there does not appear to be any evidence of active infection which is great news. No fevers, chills, nausea, vomiting, or diarrhea. The biggest issue I see  currently is that home health is not putting any medicine on the actual wounds before wrapping. 03/01/2021 upon evaluation today the patient's right leg actually appears to be doing quite well which is great news there does not appear to be any evidence of active infection at this time. No fevers, chills, nausea, vomiting, or diarrhea. With that being said the patient is having issues on the left foot where he is having significant drainage is also an ammonia smell he does not have any animals at home and this makes me concerned about a bacteria producing urea as a byproduct. Again the possible common organisms will be E. coli, Proteus, and Enterococcus. All 3 of which can be successfully treated with Levaquin. For that reason I think that this may be a good option for [place] to consider placing him on and I did obtain a culture as well for confirmation sake. 03/08/2021 upon evaluation today patient appears to be doing unfortunately still somewhat poorly in regard to his leg ulcerations. He actually has an area on the right leg where he blistered due to the fact that his wrap slid down and caused an area of pinching on his skin and this has led to a significant issue here. 03/15/2021 upon evaluation today patient unfortunately has not been wrapped appropriately with absorptive dressings nor with the appropriate technique for the third layer of the 4-layer compression wrap. These are issues that we continue to try to address with the home health nurse. Also the absorptive dressing that she had was cut in half and therefore that causes things to leak out it does not actually trap the fluid in regard to the top of the foot overall I think that all these combined are really not seeing things improved significantly here. Fortunately there does not appear to be any signs of significant infection at this time which is good news. He still is having a tremendous amount of drainage. 03/22/2021 upon evaluation today  patient appears to be draining tremendously. He still continues to tell me that he is using his pumps 2 times a day and that coupled with that tells me that he is elevating his legs as well. With that being said all things considered I am really just not seeing the improvement we would expect to see with the 4-layer compression wrap and all the above noted. He in fact had an extremely large Zetuvit dressing on both legs and that they were extremely filled to the max with fluid. This is after just being changed just before the weekend and this KELBY, ADELL ([id number]) is Monday. Nonetheless I am concerned about the fact that there is something going on fairly significant that we cannot get any of this under control and that he is draining this significantly. He supposed be having an echocardiogram it sounds like scheduling has been an issue for him as far as getting in sooner. Its something to do with needing his cousin to drive him because of where it sat and he cannot drive himself to this appointment either way I really think he needs to try to see what he can do about making this happen a little sooner. He tells me he will call today. 03/29/2021 on evaluation today patient appears to be doing about the same in regard to his legs. He did get his cardiology appointment  moved up to 6 December which is at least good that is better than what it was before mid December. Overall very pleased in that regard. 04/05/2021 upon evaluation today patient unfortunately is still doing fairly poorly. There does not appear to be any signs of active infection at this time. No fevers, chills, nausea, vomiting, or diarrhea. Unfortunately I think until his edema is under control and overall fluid overload there is really not to be much chance that I can do much to get him better. This is quite unfortunate and frustrating both for myself and the patient to be perfectly honest. Nonetheless I think that he really needs to  have a conversation both with his primary care provider as well as cardiologist he sees the PCP on Monday and cardiology on Wednesday of next week. 04/13/2021 upon evaluation today patient appears to be doing poorly in regard to his bilateral lower extremities his left is still worse than the right. With that being said he has a tremendous amount of drainage he did see his primary care provider yesterday there really was not much there to be done from their perspective. He sees cardiology tomorrow. Nonetheless my biggest concern here is simply that if we do not get the edema under control he is going to continue to have drainage and honestly I think at some point he is going to become infected severely that is my main concern. 04/19/2021 upon evaluation today patient appears to be doing poorly still in regard to his legs. Unfortunately there does not appear to be any signs of infection at this point. He does have a tremendous amount of drainage however. We have not seen the results back from the cardiologist and the echocardiogram that was done. It appears that the patient checked out okay as far as that is concerned with regard to ejection fraction though we still have some issues here to be honest with his diastolic function. I am unsure if this is accounting for everything that we are seeing or not. Either way he has a tremendous amount of drainage from his legs that we are just not able to control in the outpatient setting at this point. I have reached out to Dr. Rockey Situ his cardiologist to see once he reviews the sheet if there is anything that he feels like can be done from an outpatient perspective if not then I think the way to go is probably can to be through inpatient admission and diuresis. Otherwise I am not sure how working to get this under control we tried antibiotics, compression wrapping, and I have told the patient to be elevating his legs I am not sure how much he does of this but  either way I think that this is still an ongoing issue nonetheless. 04/26/2021 upon evaluation today patient appears to be doing poorly in regard to his legs. He is having a tremendous amount of fluid at this point which is quite unfortunate. Its to the point that he may have had at least 5 to 10 pounds of fluid in his dressings this morning when they were removed these were changed this Friday. Subsequently I think he needs to go to the ER for further evaluation and treatment I think is probably can need diuresis possibly even IV antibiotics been on what the blood work looks like but in general I feel like he needs something to get this under control from an outpatient perspective absent of everywhere I can think of and I cannot get this  under control with our traditional measures. I think this is going require more so that we can get him better 12/30; this is a patient with severe bilateral lymphedema. He was hospitalized from 04/26/2021 through 04/29/2021 treated for cellulitis in the setting of lower extremity ulcers and lymphedema. After he left the hospital he is apparently seen for nurse visit our staff contacted cardiology and he has been started on Lasix 40 mg. Apparently his legs have less edema. Lab work from 05/04/2021 showed a BUN of 38 and creatinine of 1.59 these are elevated versus previous where his creatinine seems to have been 1.30 on 12/19 his potassium is 4.3. I believe the lab work is being followed by cardiology We have him in a 4-layer wrap. Xeroform on the leg wounds and sit to fit on the Berry damage skin on the left dorsal foot versus right dorsal foot. He has compression pumps but does not use them. We have apparently not yet ordered him compression stockings 05/17/2021 upon evaluation today patient's legs though better than last time I personally saw him appear to be getting worse compared to where they were previous. Dr. Quentin Cornwall was actually last 1 to see you I have not  seen him since 19 December. That was before he went into the ER. Coming out apparently his legs looked also and they still look better but not as good as they were in the past. 1/16; patient with severe bilateral lymphedema. Severe scaled hyperkeratotic skin on the dorsal aspect of his distal left foot and left medial ankle.. On the right side changes are not as bad. He did not have any weeping edema. Our intake nurse was convinced that he is being compliant with compression pumps 1 hour twice a day 05/31/2021 upon evaluation today patient actually appears to be doing a little bit better in my opinion in regard to his feet. I do not see as much drainage and it being just completely wet as it was previous. Fortunately I do not also see any signs of active infection which is great news as well. 06/07/21 Upon inspection patient's wound bed actually showed signs of doing well he is not nearly as weepy and wet as he has been in the past and overall very pleased in that regard. Fortunately I do not see any signs of active infection locally or nor systemically at this time. Which is great news. No fevers, chills, nausea, vomiting, or diarrhea. 06/14/2021 upon evaluation today patient appears to be doing well with regard to his right foot I am pleased in that regard. His left foot is still draining quite a bit despite using lymphedema pumps, 4-layer compression wraps, and he tells me elevating his legs as well. He also has Lasix that he takes twice a day. Nonetheless I believe that this is still good to be an ongoing issue. We have a hard time getting this under control as far as the swelling is concerned. 06/21/2021 upon evaluation today patient appears to be doing decently well in regard to his wounds all things considered. He still has a tremendous amount of drainage and fluid noted at this point. Fortunately I do not see any signs of active infection locally or systemically at this point which is great news.  Nonetheless I am unsure where to go and how to do this as far as trying to limit his swelling and weeping from his toes in particular. 06/28/2021 upon evaluation patient unfortunately continues to have significant drainage from his feet. We have  been keeping him in a compression wrap and despite this he still continues to have extreme fluid issues he seen his cardiologist he is seeing the nephrologist. We really cannot find any way to get this under control when he did well was when he was in the hospital and they got some of the fluid away. But outside of that we are just struggling to achieve the long-term goal of getting this under control and keeping it under control unfortunately. 07/05/2021 upon evaluation today patient appears to be doing poorly in regard to his feet. Unfortunately this continues to be a significant issue and to be honest I am really not certain what to do about it. I referred him to Dr. Randol Kern at [facility] and he does have an appointment although it is JOHNATON, SONNEBORN ([id number]) 12 April. He also sees his primary care provider on 6 April. He did not want to see Dr. Haynes Kerns until after he saw his PCP that is the reason the appointment so far out. There is really not much I can do in that regard. Nonetheless I do think that we are still continue to have significant lymphedema issues with significant mount of weeping in regard to the feet and again this has just become extremely difficult to manage to be honest I am not sure if there is something else that Dr. Haynes Kerns or someone else could recommend he also will be seeing Dr. Dellia Nims in 2 weeks when I am on vacation and at that time I will see if Dr. Dellia Nims has any ideas about where to go from here in the meantime. 07/12/2021 upon evaluation today patient appears to be doing about as well as can be expected with regard to his feet. He does actually see his kidney doctor this Friday. He also will be seeing his primary care provider on April  4 and then following that around mid April he will be seeing Dr. Haynes Kerns at [facility] which was a referral made for him. Again my goal is to try to find out some way to fix this and to be perfectly honest we have had some issues with making any good adjustments. When he was in the hospital and greater amounts of Lasix he was able to get this down and it looked much better upon discharge. With that being said right now things just are not doing nearly as good as what they used to be. 3/13; patient presents for follow-up. He has been using his lymphedema pumps over the past week. He reports an increase in his Lasix dose. He has no issues or complaints today. He denies signs of infection. 07/26/2021 upon evaluation today patient appears to be doing better in regard to his feet bilaterally. Both are showing signs of much less drainage which is great news and overall very pleased in that regard. Fortunately there does not appear to be any evidence of active infection locally or systemically at this time. No fevers, chills, nausea, vomiting, or diarrhea. 08/02/2021 upon evaluation today patient appears to be doing well with regard to his feet. Both are showing signs of being drier the right pretty much has not really draining much at all which is great news. The left is not draining anywhere close to his much as it was during the last evaluation. This is excellent news and overall very pleased. 08/09/2021 upon evaluation today patient appears to be doing well with regard to his legs the right leg especially showing signs of excellent improvement which is great news  I do not see any evidence of active infection locally or systemically which is great. In regard to the left leg he still has some weeping and drainage but nothing as significant as what it was in the past this is great news. 08-16-2021 upon evaluation today patient appears to actually be doing quite well in my opinion in regard to his feet. This is  significantly improved compared to what we previously seen and overall I am extremely pleased in that regard. I do believe that He is actually improving although this is obviously very slow going. 08-23-2021 upon evaluation today patient appears to be doing well currently in regard to his right leg which actually is pretty dry at this point today. Fortunately I do not see any evidence of active infection at this time which is great news. No fevers, chills, nausea, vomiting, or diarrhea. 08-30-2021 upon evaluation today patient appears to be doing well with regard to his lower extremities. The right foot is pretty much completely dry which is great news the left foot though not completely dry seems to be doing decently well. I do not see any signs of active infection locally or systemically which is great news. No fevers, chills, nausea, vomiting, or diarrhea. 09-06-2021 upon evaluation today patient appears to be doing well with regard to his feet in fact now the left foot is almost completely dry as well and I am definitely seeing a lot of significant improvement. With that being said unfortunately he actually appears to have some cellulitis of his right thigh. His toes are also little bit red but this may just be due to the increased swelling. He really is not warm to touch in regard to the toes. 5/8; excellent edema control on the right foot and lower leg there is no open wounds but we continue to put compression on this otherwise this will breakdown. He is using his compression pumps twice a day at home The area that is problematic is on the left dorsal foot some areas that are not fully epithelialized with very dry fissured skin over this area. 09-20-2021 upon evaluation today patient appears to be doing a little bit worse in regard to swelling at this time. Fortunately I do not see any signs of infection with that being said the wrap was not on quite as well as what I would like to have seen. I do  believe that this has caused a little bit of excess swelling and again we need to try to get this under good control. 09-27-2021 upon evaluation today patient appears to be doing awesome in regard to his feet and legs. Everything is measuring smaller the swelling is down and to be perfectly honest I am extremely pleased with the end of his feet especially on the left side and how dry this is today. I do think we are on the right track here. 5/31; patient presents for follow-up. He is using nystatin powder to the feet bilaterally under 4-layer compression. He has no issues or complaints today. He states he is going to the lymphedema clinic tomorrow. Since he has no open wounds on the right lower extremity they will be focused on the side. 10-11-2021 upon evaluation today patient appears to be doing excellent in regard to his legs. He has been tolerating the dressing changes without complication. Fortunately there does not appear to be any evidence of active infection locally or systemically at this time which is great news. No fevers, chills, nausea, vomiting, or diarrhea.  10-18-2021 upon evaluation today patient appears to be doing well with regard to his legs. He has been tolerating the dressing changes without complication and actually seen in lymphedema clinic now in regard to his right leg were taken care of the left leg. Fortunately I do not see any evidence of active infection locally or systemically at this time. 10-26-2021 upon evaluation today patient's wounds actually are showing signs of doing well in fact he really does not have wounds as much is weeping of the lower extremities. Fortunately I do not see any evidence of active infection locally or systemically which is great news. No fever or chills noted 11-02-2021 upon evaluation today patient appears to be doing decently well in regard to his legs. Of actually been on the phone quite a bit discussing with [facility] orthotics custom  compression for him. I also have been discussing everything with his lymphedema clinic provider as well. Helene Kelp notes that there is really not much more that she can do for him which was noted last week as well. Subsequently I do believe that the patient would benefit from getting custom compression in fact I think that is what he is going require to keep anything under control here. He voiced understanding. 11-08-2021 upon evaluation today patient appears to be doing excellent in regard to his legs and feet. Fortunately I do not see any signs of infection at this time which is great news. No fever or chills noted again I am still been working on trying to figure out what exactly we need to do as far as getting the [place] involved in coverage for custom compression for this patient. I also discussed with him that he does need to have [place], [name] ([id number]) which I completely understand. With that being said it does make it difficult thinking about what kind of combination of shoes and compression is going to be best for him for the long-term. Fortunately I do not see any evidence of active infection locally or systemically which is great news. Overall I think you are doing quite well. 11-16-2021 upon evaluation today patient appears to be doing well currently in regard to his legs and feet. Everything is significantly smaller compared to what has been. Fortunately I do not see any evidence of active infection locally or systemically at this time which is great news. 11-23-2021 upon evaluation today patient appears to be doing well with regard to his legs bilaterally. We can actually initiate treatment with a recommendation today to utilize Tubigrip and try to see if we can get this under better control. The patient is in agreement with the plan. Nonetheless the big question is whether this will be something that he can utilize at home in order to keep his swelling down and allow him to  be able to function without having to come into the clinic as frequently. He notes that he would love to get to this point. He does have a cousin who is willing to help him with change out the Tubigrip when necessary. 7/25; the patient does not have any open wounds however his left leg is a lot more swollen than it was last week. His cousin came in who presumably would be the one to change his juxta lites to practice on doing this for the patient. She apparently lives in the [place] block. In the meantime he has been using Tubigrip 12-07-2021 upon evaluation today patient appears to be doing well with regard to his legs although we  are transitioning to try to get him to use the Tubigrip which again has been a little bit of a setback but not too much overall I think he is still doing well he still able to wear his current shoes which is good news. He does have a cousin who is willing to help him with the Tubigrip and we are training her how to do this as well. She is here today. 12-14-2021 upon evaluation today patient appears to be doing well currently in regard to his right leg which I think is doing excellent with the Tubigrip. With regard to the left leg unfortunately this is not doing nearly as well and will get me to do something to intervene here. I think getting back into a 4-layer wraps can be the way to go short-term To get this back under control and the patient voiced understanding. 12-21-2021 upon evaluation today patient appears to be tolerating the dressing changes currently without complication the wrap of the left leg is doing well the right leg reason Tubigrip. The wound is looking significantly improved. 12-28-2021 upon evaluation today patient appears to be doing well currently in regard to his wound. He has been tolerating the dressing changes without complication. Fortunately there is no evidence of active infection locally or systemically at this time. I think he is very close to  complete resolution on the left leg and I think we are ready to go back to the Tubigrip. 01-04-2022 upon evaluation today patient appears to be doing okay in regard to his legs of the more swollen because he was rolling and pushing the Tubigrip down because it felt "too tight". With that being said I discussed with the patient today that we really do need to keep this pulled up and if he keeps it pulled up and when it starts to feel tight gets to where he can elevate his legs I think that he can keep this under control. I would suggest if it starts feeling tight that he lay down where he can elevate his legs above heart level to basically help the swelling to go down is much as possible. He voiced understanding. I also told him that is can to be his job to keep these pulled up and that if he is not able to do this I [name] [name] probably have to refer him elsewhere to get a second opinion as I have pretty much done everything I can try to get his legs under control and keep them under control he is much better than where he used to be and his feet can actually fit in his shoes which is great but in general I just do not know what else to do he has his cousin helping with putting the Tubigrip's on which is awesome and a great resource for him but again she cannot be there to keep them pulled up and nor is that even her job. Objective Constitutional Well-nourished and well-hydrated in no acute distress. Vitals Time Taken: 11:06 AM, Weight: 312 lbs, Temperature: 97.6 F, Pulse: 68 bpm, Respiratory Rate: 18 breaths/min, Blood Pressure: 118/60 mmHg. Respiratory normal breathing without difficulty. Psychiatric this patient is able to make decisions and demonstrates good insight into disease process. Alert and Oriented x 3. pleasant and cooperative. General Notes: Upon inspection patient's wound is just very tiny on the anterior portion of the left ankle region. There does not appear to be any evidence of  infection here and I think that is almost completely closed with  that being said it still has a little bit of healing left to go. With regard to the remainder of the legs the upper portion of the legs are much more swollen due to not keeping the Tubigrip pulled up other than that he seems to be doing decently well here. 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 Nurse Visit as needed Bathing/ Shower/ Hygiene: May shower with wound dressing protected with water repellent cover or cast protector. No tub bath. Anesthetic (Use 'Patient Medications' Section for Anesthetic Order Entry): Lidocaine applied to wound bed Edema Control - Lymphedema / Segmental Compressive Device / Other: 4 Layer Compression System Lymphedema. - left side with nystatin between toes and silver cell to any weeping areas with tubi c double layer to ankle/foot Tubigrip double layer applied - Nystatin powder between toes double size d bilat just behind toes to patella knotch , double size C foot ankles enclose toes to just above ankle scell to any weeping areas Elevate, Exercise Daily and Avoid Standing for Long Periods of Time. Elevate legs to the level of the heart and pump ankles as often as possible Elevate leg(s) parallel to the floor when sitting. Compression Pump: Use compression pump on left lower extremity for 60 minutes, twice daily. - 2 times per day DO YOUR BEST to sleep in the bed at night. DO NOT sleep in your recliner. Long  hours of sitting in a recliner leads to swelling of the legs and/or potential wounds on your backside. Other: - Contact prescriber regarding use of diuretics to reduce fluid overload. Off-Loading: Turn and reposition every 2 hours Additional Orders / Instructions: Follow Nutritious Diet and Increase Protein Intake Medications-Please add to medication list.: P.O. Antibiotics 1. I explained to the patient that I completely understand this is not terribly comfortable to have to wear all the time but to be peripherally honest I do not really know what else he has as far as any choice is concerned. I explained to him that is can be up to him to keep this pulled up and when it starts to feel tight to go elevate his legs. Short of that I do not know what else we can do we have with literally tried everything and multiple possibilities for how to keep his swelling under control and this is the only thing that even has a chance at doing it. He voiced understanding and if he is not able to keep this pulled up or does not continue to pull it up to make sure is not swelling in the upper portion of the leg and we will get a probably have to refer him for second opinion to another provider to see if they have any other thoughts or ideas. 2. In the meantime I did tell him that anytime he starts to feel his leg swelling and it starts to get painful and tight that he needs to elevate his legs above heart level like laying on the couch with pillows under his legs. He voiced understanding. We will see patient back for reevaluation in 1 week here in the clinic. If  anything worsens or changes patient will contact our office for additional recommendations. Electronic Signature(s) Signed: 01/04/2022 11:36:41 AM By: [name] Keeler PA-C Entered By: [name] on 01/04/2022 11:36:41 [name] ([id number]) -------------------------------------------------------------------------------- SuperBill  Details Patient Name: [name] Date of Service: 01/04/2022 Medical Record Number: [id number] Patient Account Number: [id number] Date of Birth/Sex: Nov 03, 1950 (71 y.o. 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 I10 Essential (primary) hypertension L97.811 Non-pressure chronic ulcer of other part of right lower leg limited to breakdown of skin Facility Procedures CPT4 Code: 91660600 Description: [phone] - WOUND CARE VISIT-LEV 2 EST PT Modifier: Quantity: 1 Physician Procedures CPT4 Code: 7414239 Description: 99213 - WC PHYS LEVEL 3 - EST PT Modifier: Quantity: 1 CPT4 Code: Description: ICD-10 Diagnosis Description E11.622 Type 2 diabetes mellitus with other skin ulcer I89.0 Lymphedema, not elsewhere classified I87.2 Venous insufficiency (chronic) (peripheral) L97.822 Non-pressure chronic ulcer of other part of left lower  leg with fat layer Modifier: exposed Quantity: Electronic Signature(s) Signed: 01/04/2022 11:37:05 AM By: [name] Keeler PA-C Entered By: [name] on 01/04/2022 11:37:04

## 2022-01-05 ENCOUNTER — Encounter: Payer: PPO | Admitting: Occupational Therapy

## 2022-01-07 NOTE — Progress Notes (Signed)
[name], [name] ([id number]) Visit Report for 01/04/2022 Arrival Information Details Patient Name: [name], [name] Date of Service: 01/04/2022 11:00 AM Medical Record Number: [id number] Patient Account Number: [id number] Date of Birth/Sex: 11/13/1950 (71 y.o. M) Treating RN: [name], [name] Primary Care Mellie Buccellato: [name], [name] Other Clinician: Referring Zayna Toste: [name], [name] Treating Jaxxon Naeem/Extender: Stone, [name] Weeks in Treatment: 49 Visit Information History Since Last Visit All ordered tests and consults were completed: No Patient Arrived: Cane Added or deleted any medications: No Arrival Time: 10:59 Any new allergies or adverse reactions: No Accompanied By: self Had a fall or experienced change in No Transfer Assistance: None activities of daily living that may affect Patient Identification Verified: Yes risk of falls: Secondary Verification Process Completed: Yes Signs or symptoms of abuse/neglect since last visito No Patient Requires Transmission-Based No Hospitalized since last visit: No Precautions: Implantable device outside of the clinic excluding No Patient Has Alerts: Yes cellular tissue based products placed in the center Patient Alerts: AV[facility] consult on file since last visit: Last ABI-R 1.09; L Has Dressing in Place as Prescribed: Yes 1.04 Has Compression in Place as Prescribed: Yes Pain Present Now: No Electronic Signature(s) Signed: 01/06/2022 8:44:03 PM By: [name], [name] RN Entered By: [name], [name] on 01/04/2022 11:06:20 [name], [name] ([id number]) -------------------------------------------------------------------------------- Clinic Level of Care Assessment Details Patient Name: [name], [name] Date of Service: 01/04/2022 11:00 AM Medical Record Number: [id number] Patient Account Number: [id number] Date of Birth/Sex: 04/01/1951 (71 y.o. M) Treating RN: [name], [name] Primary Care Johnathon Olden: [name], [name] Other Clinician: Referring Licia Harl:  [name], [name] Treating Rosalita Carey/Extender: Stone, [name] Weeks in Treatment: 49 Clinic Level of Care Assessment Items TOOL 4 Quantity Score X - Use when only an EandM is performed on FOLLOW-UP visit 1 0 ASSESSMENTS - Nursing Assessment / Reassessment X - Reassessment of Co-morbidities (includes updates in patient status) 1 10 X- 1 5 Reassessment of Adherence to Treatment Plan ASSESSMENTS - Wound and Skin Assessment / Reassessment [] - Simple Wound Assessment / Reassessment - one wound 0 [] - 0 Complex Wound Assessment / Reassessment - multiple wounds X- 1 10 Dermatologic / Skin Assessment (not related to wound area) ASSESSMENTS - Focused Assessment [] - Circumferential Edema Measurements - multi extremities 0 [] - 0 Nutritional Assessment / Counseling / Intervention [] - 0 Lower Extremity Assessment (monofilament, tuning fork, pulses) [] - 0 Peripheral Arterial Disease Assessment (using hand held doppler) ASSESSMENTS - Ostomy and/or Continence Assessment and Care [] - Incontinence Assessment and Management 0 [] - 0 Ostomy Care Assessment and Management (repouching, etc.) PROCESS - Coordination of Care [] - Simple Patient / Family Education for ongoing care 0 [] - 0 Complex (extensive) Patient / Family Education for ongoing care [] - 0 Staff obtains Consents, Records, Test Results / Process Orders [] - 0 Staff telephones HHA, Nursing Homes / Clarify orders / etc [] - 0 Routine Transfer to another Facility (non-emergent condition) [] - 0 Routine Hospital Admission (non-emergent condition) [] - 0 New Admissions / Insurance Authorizations / Ordering NPWT, Apligraf, etc. [] - 0 Emergency Hospital Admission (emergent condition) X- 1 10 Simple Discharge Coordination [] - 0 Complex (extensive) Discharge Coordination PROCESS - Special Needs [] - Pediatric / Minor Patient Management 0 [] - 0 Isolation Patient Management [] - 0 Hearing / Language / Visual special  needs [] - 0 Assessment of Community assistance (transportation, D/C planning, etc.) [] - 0 Additional assistance / Altered mentation [] - 0 Support Surface(s) Assessment (bed, cushion, seat, etc.) INTERVENTIONS - Wound Cleansing /   Measurement [name], [name] ([id number]) [] - 0 Simple Wound Cleansing - one wound [] - 0 Complex Wound Cleansing - multiple wounds [] - 0 Wound Imaging (photographs - any number of wounds) [] - 0 Wound Tracing (instead of photographs) [] - 0 Simple Wound Measurement - one wound [] - 0 Complex Wound Measurement - multiple wounds INTERVENTIONS - Wound Dressings [] - Small Wound Dressing one or multiple wounds 0 [] - 0 Medium Wound Dressing one or multiple wounds [] - 0 Large Wound Dressing one or multiple wounds X- 1 5 Application of Medications - topical [] - 0 Application of Medications - injection INTERVENTIONS - Miscellaneous [] - External ear exam 0 [] - 0 Specimen Collection (cultures, biopsies, blood, body fluids, etc.) [] - 0 Specimen(s) / Culture(s) sent or taken to Lab for analysis [] - 0 Patient Transfer (multiple staff / [employer] fast streamer / Similar devices) [] - 0 Simple Staple / Suture removal (25 or less) [] - 0 Complex Staple / Suture removal (26 or more) [] - 0 Hypo / Hyperglycemic Management (close monitor of Blood Glucose) [] - 0 Ankle / Brachial Index (ABI) - do not check if billed separately X- 1 5 Vital Signs Has the patient been seen at the hospital within the last three years: Yes Total Score: 45 Level Of Care: New/Established - Level 2 Electronic Signature(s) Signed: 01/06/2022 8:44:03 PM By: [name] RN Entered By: [name] on 01/04/2022 11:23:58 [name] ([id number]) -------------------------------------------------------------------------------- Encounter Discharge Information Details Patient Name: [name] Date of Service: 01/04/2022 11:00 AM Medical Record Number: [id number] Patient Account  Number: [id number] Date of Birth/Sex: 1950/07/20 (71 y.o. M) Treating RN: [name] Primary Care Bradlee Heitman: Fontaine No Other Clinician: Referring Tiwanda Threats: Fontaine No Treating Gaile Allmon/Extender: Skipper Cliche in Treatment: 6 Encounter Discharge Information Items Discharge Condition: Stable Ambulatory Status: Ambulatory Discharge Destination: Home Transportation: Private Auto Accompanied By: self Schedule Follow-up Appointment: Yes Clinical Summary of Care: Electronic Signature(s) Signed: 01/06/2022 8:44:03 PM By: [name] RN Entered By: [name] on 01/04/2022 11:28:20 [name] ([id number]) -------------------------------------------------------------------------------- Lower Extremity Assessment Details Patient Name: [name] Date of Service: 01/04/2022 11:00 AM Medical Record Number: [id number] Patient Account Number: [id number] Date of Birth/Sex: Apr 06, 1951 (71 y.o. M) Treating RN: [name] Primary Care Cara Aguino: Fontaine No Other Clinician: Referring Janace Decker: Fontaine No Treating Anastazja Isaac/Extender: Skipper Cliche in Treatment: 49 Edema Assessment Assessed: [Left: No] [name]: No] [Left: Edema] [Right: :] Calf Left: Right: Point of Measurement: 38 cm From Medial Instep 42 cm 45 cm Ankle Left: Right: Point of Measurement: 14 cm From Medial Instep 24 cm 30 cm Electronic Signature(s) Signed: 01/06/2022 8:44:03 PM By: [name] RN Entered By: [name] on 01/04/2022 11:08:49 [name] ([id number]) -------------------------------------------------------------------------------- Multi Wound Chart Details Patient Name: [name] Date of Service: 01/04/2022 11:00 AM Medical Record Number: [id number] Patient Account Number: [id number] Date of Birth/Sex: 01-24-1951 (70 y.o. M) Treating RN: [name] Primary Care Graycen Sadlon: Fontaine No Other Clinician: Referring Susana Gripp: Fontaine No Treating Maliah Pyles/Extender: Skipper Cliche in Treatment: 49 Vital Signs Height(in): Pulse(bpm): 68 Weight(lbs): 312 Blood Pressure(mmHg): 118/60 Body Mass Index(BMI): Temperature(F): 97.6 Respiratory Rate(breaths/min): 18 Wound Assessments Treatment Notes Electronic Signature(s) Signed: 01/06/2022 8:44:03 PM By: [name] RN Entered By: [name] on 01/04/2022 11:10:19 [name] ([id number]) -------------------------------------------------------------------------------- [place] Details Patient Name: [name] Date of Service: 01/04/2022 11:00 AM Medical Record Number: [id number] Patient Account Number: [id number] Date of Birth/Sex: 08/29/1950 (71 y.o. M) Treating RN: [name] Primary Care  Provider: [name], [name] Other Clinician: Referring Provider: [name], [name] Treating Provider/Extender: Stone, [name] Weeks in Treatment: 49 Active Inactive Soft Tissue Infection Nursing Diagnoses: Impaired tissue integrity Knowledge deficit related to disease process and management Knowledge deficit related to home infection control: handwashing, handling of soiled dressings, supply storage Potential for infection: soft tissue Goals: Patient/caregiver will verbalize understanding of or measures to prevent infection and contamination in the home setting Date Initiated: 09/06/2021 Date Inactivated: 10/18/2021 Target Resolution Date: 09/06/2021 Goal Status: Met Patient's soft tissue infection will resolve Date Initiated: 09/06/2021 Date Inactivated: 10/18/2021 Target Resolution Date: 09/06/2021 Goal Status: Met Signs and symptoms of infection will be recognized early to allow for prompt treatment Date Initiated: 09/06/2021 Target Resolution Date: 11/28/2021 Goal Status: Active Interventions: Assess signs and symptoms of infection every visit Provide education on infection Treatment Activities: Education provided on Infection :  12/28/2021 Systemic antibiotics : 09/06/2021 Notes: Electronic Signature(s) Signed: 01/06/2022 8:44:03 PM By: [name], [name] RN Entered By: [name], [name] on 01/04/2022 11:09:56 [name], [name] ([id number]) -------------------------------------------------------------------------------- Pain Assessment Details Patient Name: [name], [name] Date of Service: 01/04/2022 11:00 AM Medical Record Number: [id number] Patient Account Number: [id number] Date of Birth/Sex: 03/05/1951 (71 y.o. M) Treating RN: [name], [name] Primary Care Provider: [name], [name] Other Clinician: Referring Provider: [name], [name] Treating Provider/Extender: Stone, [name] Weeks in Treatment: 49 Active Problems Location of Pain Severity and Description of Pain Patient Has Paino No Site Locations Pain Management and Medication Current Pain Management: Electronic Signature(s) Signed: 01/06/2022 8:44:03 PM By: [name], [name] RN Entered By: [name], [name] on 01/04/2022 11:06:48 [name], [name] ([id number]) -------------------------------------------------------------------------------- Patient/Caregiver Education Details Patient Name: Mooney, [name] Date of Service: 01/04/2022 11:00 AM Medical Record Number: [id number] Patient Account Number: [id number] Date of Birth/Gender: 09/11/1950 (71 y.o. M) Treating RN: [name], [name] Primary Care Physician: [name], [name] Other Clinician: Referring Physician: [name], [name] Treating Physician/Extender: Stone, [name] Weeks in Treatment: 49 Education Assessment Education Provided To: Patient Education Topics Provided Infection: Methods: Explain/Verbal Responses: State content correctly Electronic Signature(s) Signed: 01/06/2022 8:44:03 PM By: [name], [name] RN Entered By: [name], [name] on 01/04/2022 11:27:44 [name], [name] ([id number]) -------------------------------------------------------------------------------- Vitals Details Patient Name: [name], [name] Date of Service:  01/04/2022 11:00 AM Medical Record Number: [id number] Patient Account Number: [id number] Date of Birth/Sex: 01/06/1951 (71 y.o. M) Treating RN: [name], [name] Primary Care Provider: [name], [name] Other Clinician: Referring Provider: [name], [name] Treating Provider/Extender: Stone, [name] Weeks in Treatment: 49 Vital Signs Time Taken: 11:06 Temperature (°F): 97.6 Weight (lbs): 312 Pulse (bpm): 68 Respiratory Rate (breaths/min): 18 Blood Pressure (mmHg): 118/60 Reference Range: 80 - 120 mg / dl Electronic Signature(s) Signed: 01/06/2022 8:44:03 PM By: [name], [name] RN Entered By: [name], [name] on 01/04/2022 11:06:42 

## 2022-01-11 ENCOUNTER — Encounter: Payer: No Typology Code available for payment source | Attending: Physician Assistant | Admitting: Physician Assistant

## 2022-01-11 DIAGNOSIS — I110 Hypertensive heart disease with heart failure: Secondary | ICD-10-CM | POA: Insufficient documentation

## 2022-01-11 DIAGNOSIS — I872 Venous insufficiency (chronic) (peripheral): Secondary | ICD-10-CM | POA: Insufficient documentation

## 2022-01-11 DIAGNOSIS — I779 Disorder of arteries and arterioles, unspecified: Secondary | ICD-10-CM | POA: Insufficient documentation

## 2022-01-11 DIAGNOSIS — L97512 Non-pressure chronic ulcer of other part of right foot with fat layer exposed: Secondary | ICD-10-CM | POA: Diagnosis not present

## 2022-01-11 DIAGNOSIS — I890 Lymphedema, not elsewhere classified: Secondary | ICD-10-CM | POA: Diagnosis not present

## 2022-01-11 DIAGNOSIS — L97811 Non-pressure chronic ulcer of other part of right lower leg limited to breakdown of skin: Secondary | ICD-10-CM | POA: Insufficient documentation

## 2022-01-11 DIAGNOSIS — I509 Heart failure, unspecified: Secondary | ICD-10-CM | POA: Diagnosis not present

## 2022-01-11 DIAGNOSIS — E11621 Type 2 diabetes mellitus with foot ulcer: Secondary | ICD-10-CM | POA: Insufficient documentation

## 2022-01-11 DIAGNOSIS — L97822 Non-pressure chronic ulcer of other part of left lower leg with fat layer exposed: Secondary | ICD-10-CM | POA: Diagnosis not present

## 2022-01-11 DIAGNOSIS — E1142 Type 2 diabetes mellitus with diabetic polyneuropathy: Secondary | ICD-10-CM | POA: Insufficient documentation

## 2022-01-11 DIAGNOSIS — W19XXXA Unspecified fall, initial encounter: Secondary | ICD-10-CM | POA: Insufficient documentation

## 2022-01-11 DIAGNOSIS — E11622 Type 2 diabetes mellitus with other skin ulcer: Secondary | ICD-10-CM | POA: Diagnosis not present

## 2022-01-11 NOTE — Progress Notes (Addendum)
[name] ([id number]) Visit Report for 01/11/2022 Arrival Information Details Patient Name: [name], [name] Date of Service: 01/11/2022 11:00 AM Medical Record Number: [id number] Patient Account Number: [id number] Date of Birth/Sex: 09/15/1950 (71 y.o. M) Treating RN: [name] Primary Care Provider: [name], Mooney Other Clinician: Referring Provider: [name], Mooney Treating Provider/Extender: [name] Weeks in Treatment: 50 Visit Information History Since Last Visit All ordered tests and consults were completed: Mooney Patient Arrived: Cane Added or deleted any medications: Mooney Arrival Time: 11:00 Any new allergies or adverse reactions: Mooney Accompanied By: self Had a fall or experienced change in Mooney Transfer Assistance: None activities of daily living that may affect Patient Identification Verified: Yes risk of falls: Secondary Verification Process Completed: Yes Signs or symptoms of abuse/neglect since last visito Mooney Patient Requires Transmission-Based Mooney Hospitalized since last visit: Mooney Precautions: Implantable device outside of the clinic excluding Mooney Patient Has Alerts: Yes cellular tissue based products placed in the center Patient Alerts: AVVS consult on file since last visit: Last ABI-R 1.09; L Has Dressing in Place as Prescribed: Yes 1.04 Has Compression in Place as Prescribed: Yes Pain Present Now: Mooney Electronic Signature(s) Signed: 01/17/2022 8:13:04 AM By: [name], [name] RN Entered By: [name] on 01/11/2022 11:21:09 [name], [name] ([id number]) -------------------------------------------------------------------------------- Clinic Level of Care Assessment Details Patient Name: [name], [name] Date of Service: 01/11/2022 11:00 AM Medical Record Number: [id number] Patient Account Number: [id number] Date of Birth/Sex: 03/06/1951 (71 y.o. M) Treating RN: [name] Primary Care Provider: [name], Mooney Other Clinician: Referring Provider:  [name], Mooney Treating Provider/Extender: [name] Weeks in Treatment: 50 Clinic Level of Care Assessment Items TOOL 4 Quantity Score X - Use when only an EandM is performed on FOLLOW-UP visit 1 0 ASSESSMENTS - Nursing Assessment / Reassessment X - Reassessment of Co-morbidities (includes updates in patient status) 1 10 X- 1 5 Reassessment of Adherence to Treatment Plan ASSESSMENTS - Wound and Skin Assessment / Reassessment [] - Simple Wound Assessment / Reassessment - one wound 0 [] - 0 Complex Wound Assessment / Reassessment - multiple wounds [] - 0 Dermatologic / Skin Assessment (not related to wound area) ASSESSMENTS - Focused Assessment [] - Circumferential Edema Measurements - multi extremities 0 [] - 0 Nutritional Assessment / Counseling / Intervention [] - 0 Lower Extremity Assessment (monofilament, tuning fork, pulses) [] - 0 Peripheral Arterial Disease Assessment (using hand held doppler) ASSESSMENTS - Ostomy and/or Continence Assessment and Care [] - Incontinence Assessment and Management 0 [] - 0 Ostomy Care Assessment and Management (repouching, etc.) PROCESS - Coordination of Care X - Simple Patient / Family Education for ongoing care 1 15 [] - 0 Complex (extensive) Patient / Family Education for ongoing care [] - 0 Staff obtains Consents, Records, Test Results / Process Orders [] - 0 Staff telephones HHA, Nursing Homes / Clarify orders / etc [] - 0 Routine Transfer to another Facility (non-emergent condition) [] - 0 Routine Hospital Admission (non-emergent condition) [] - 0 New Admissions / Insurance Authorizations / Ordering NPWT, Apligraf, etc. [] - 0 Emergency Hospital Admission (emergent condition) X- 1 10 Simple Discharge Coordination [] - 0 Complex (extensive) Discharge Coordination PROCESS - Special Needs [] - Pediatric / Minor Patient Management 0 [] - 0 Isolation Patient Management [] - 0 Hearing / Language / Visual special  needs [] - 0 Assessment of Community assistance (transportation, D/C planning, etc.) [] - 0 Additional assistance / Altered mentation [] - 0 Support Surface(s) Assessment (bed, cushion, seat, etc.) INTERVENTIONS - Wound Cleansing /   Measurement [name] ([id number]) [] - 0 Simple Wound Cleansing - one wound [] - 0 Complex Wound Cleansing - multiple wounds [] - 0 Wound Imaging (photographs - any number of wounds) [] - 0 Wound Tracing (instead of photographs) [] - 0 Simple Wound Measurement - one wound [] - 0 Complex Wound Measurement - multiple wounds INTERVENTIONS - Wound Dressings [] - Small Wound Dressing one or multiple wounds 0 [] - 0 Medium Wound Dressing one or multiple wounds [] - 0 Large Wound Dressing one or multiple wounds [] - 0 Application of Medications - topical [] - 0 Application of Medications - injection INTERVENTIONS - Miscellaneous [] - External ear exam 0 [] - 0 Specimen Collection (cultures, biopsies, blood, body fluids, etc.) [] - 0 Specimen(s) / Culture(s) sent or taken to Lab for analysis [] - 0 Patient Transfer (multiple staff / [employer] fast streamer / Similar devices) [] - 0 Simple Staple / Suture removal (25 or less) [] - 0 Complex Staple / Suture removal (26 or more) [] - 0 Hypo / Hyperglycemic Management (close monitor of Blood Glucose) [] - 0 Ankle / Brachial Index (ABI) - do not check if billed separately X- 1 5 Vital Signs Has the patient been seen at the hospital within the last three years: Yes Total Score: 45 Level Of Care: New/Established - Level 2 Electronic Signature(s) Signed: 01/17/2022 8:13:04 AM By: [name] Coria RN Entered By: [name] on 01/11/2022 12:33:45 [name] ([id number]) -------------------------------------------------------------------------------- Encounter Discharge Information Details Patient Name: [name] Date of Service: 01/11/2022 11:00 AM Medical Record Number: [id number] Patient Account  Number: [id number] Date of Birth/Sex: Jan 04, 1951 (71 y.o. M) Treating RN: [name] Primary Care [name]: [name] Other Clinician: Referring [name]: [name] Treating [name]/Extender: [name] in Treatment: 78 Encounter Discharge Information Items Discharge Condition: Stable Ambulatory Status: Cane Discharge Destination: Home Transportation: Private Auto Accompanied By: self Schedule Follow-up Appointment: Yes Clinical Summary of Care: Electronic Signature(s) Signed: 01/17/2022 8:13:04 AM By: [name] Coria RN Entered By: [name] on 01/11/2022 12:34:33 [name] ([id number]) -------------------------------------------------------------------------------- Lower Extremity Assessment Details Patient Name: [name] Date of Service: 01/11/2022 11:00 AM Medical Record Number: [id number] Patient Account Number: [id number] Date of Birth/Sex: 29-Oct-1950 (71 y.o. M) Treating RN: [name] Primary Care Anavi Branscum: [name] Other Clinician: Referring Parminder Cupples: [name] Treating Nuel Dejaynes/Extender: [name] in Treatment: 50 Edema Assessment Assessed: [Left: Mooney] Patrice Paradise: Mooney] [Left: Edema] [Right: :] Calf Left: Right: Point of Measurement: 38 cm From Medial Instep 37 cm 40 cm Ankle Left: Right: Point of Measurement: 14 cm From Medial Instep 24 cm 27 cm Vascular Assessment Pulses: Dorsalis Pedis Palpable: [Left:Yes] [Right:Yes] Electronic Signature(s) Signed: 01/17/2022 8:13:04 AM By: [name] Coria RN Entered By: [name] on 01/11/2022 11:22:46 [name] ([id number]) -------------------------------------------------------------------------------- Multi Wound Chart Details Patient Name: [name] Date of Service: 01/11/2022 11:00 AM Medical Record Number: [id number] Patient Account Number: [id number] Date of Birth/Sex: 17-Jul-1950 (71 y.o. M) Treating RN: [name] Primary Care Kadence Mikkelson:  [name] Other Clinician: Referring Adreanne Yono: [name] Treating Lucille Witts/Extender: [name] in Treatment: 50 Vital Signs Height(in): Pulse(bpm): 76 Weight(lbs): 312 Blood Pressure(mmHg): 143/71 Body Mass Index(BMI): Temperature(F): 97.9 Respiratory Rate(breaths/min): 18 Wound Assessments Treatment Notes Electronic Signature(s) Signed: 01/17/2022 8:13:04 AM By: [name] Coria RN Entered By: [name] on 01/11/2022 11:23:31 [name] ([id number]) -------------------------------------------------------------------------------- [place] Details Patient Name: [name] Date of Service: 01/11/2022 11:00 AM Medical Record Number: [id number] Patient Account Number: [id number] Date of Birth/Sex: Mar 13, 1951 (71  y.o. M) Treating RN: [name] Primary Care Provider: [name], Mooney Other Clinician: Referring Provider: [name], Mooney Treating Provider/Extender: [name] Weeks in Treatment: 50 Active Inactive Soft Tissue Infection Nursing Diagnoses: Impaired tissue integrity Knowledge deficit related to disease process and management Knowledge deficit related to home infection control: handwashing, handling of soiled dressings, supply storage Potential for infection: soft tissue Goals: Patient/caregiver will verbalize understanding of or measures to prevent infection and contamination in the home setting Date Initiated: 09/06/2021 Date Inactivated: 10/18/2021 Target Resolution Date: 09/06/2021 Goal Status: Met Patient's soft tissue infection will resolve Date Initiated: 09/06/2021 Date Inactivated: 10/18/2021 Target Resolution Date: 09/06/2021 Goal Status: Met Signs and symptoms of infection will be recognized early to allow for prompt treatment Date Initiated: 09/06/2021 Target Resolution Date: 11/28/2021 Goal Status: Active Interventions: Assess signs and symptoms of infection every visit Provide education on  infection Treatment Activities: Education provided on Infection : 01/04/2022 Systemic antibiotics : 09/06/2021 Notes: Electronic Signature(s) Signed: 01/17/2022 8:13:04 AM By: [name], [name] RN Entered By: [name] on 01/11/2022 11:23:22 [name], [name] ([id number]) -------------------------------------------------------------------------------- Pain Assessment Details Patient Name: [name], [name] Date of Service: 01/11/2022 11:00 AM Medical Record Number: [id number] Patient Account Number: [id number] Date of Birth/Sex: 10/24/1950 (71 y.o. M) Treating RN: [name] Primary Care Provider: [name], Mooney Other Clinician: Referring Provider: [name], Mooney Treating Provider/Extender: [name] Weeks in Treatment: 50 Active Problems Location of Pain Severity and Description of Pain Patient Has Paino Mooney Site Locations Pain Management and Medication Current Pain Management: Electronic Signature(s) Signed: 01/17/2022 8:13:04 AM By: [name], [name] RN Entered By: [name] on 01/11/2022 11:21:40 [name], [name] ([id number]) -------------------------------------------------------------------------------- Patient/Caregiver Education Details Patient Name: [name], [name] Date of Service: 01/11/2022 11:00 AM Medical Record Number: [id number] Patient Account Number: [id number] Date of Birth/Gender: 11/30/1950 (71 y.o. M) Treating RN: [name] Primary Care Physician: [name] Other Clinician: Referring Physician: [name], Mooney Treating Physician/Extender: [name] Weeks in Treatment: 50 Education Assessment Education Provided To: Patient Education Topics Provided Infection: Methods: Explain/Verbal Responses: State content correctly Electronic Signature(s) Signed: 01/17/2022 8:13:04 AM By: [name], [name] RN Entered By: [name] on 01/11/2022 12:34:04 [name], [name]  ([id number]) -------------------------------------------------------------------------------- Vitals Details Patient Name: [name], [name] Date of Service: 01/11/2022 11:00 AM Medical Record Number: [id number] Patient Account Number: [id number] Date of Birth/Sex: 11/05/1950 (71 y.o. M) Treating RN: [name] Primary Care Provider: [name], Mooney Other Clinician: Referring Provider: [name], Mooney Treating Provider/Extender: [name] Weeks in Treatment: 50 Vital Signs Time Taken: 11:00 Temperature (°F): 97.9 Weight (lbs): 312 Pulse (bpm): 76 Respiratory Rate (breaths/min): 18 Blood Pressure (mmHg): 143/71 Reference Range: 80 - 120 mg / dl Electronic Signature(s) Signed: 01/17/2022 8:13:04 AM By: [name], [name] RN Entered By: [name] on 01/11/2022 11:21:32 

## 2022-01-11 NOTE — Progress Notes (Addendum)
JUSTINN, Mooney ([id number]) Visit Report for 01/11/2022 Chief Complaint Document Details Patient Name: [name], [name] Date of Service: 01/11/2022 11:00 AM Medical Record Number: [id number] Patient Account Number: [id number] Date of Birth/Sex: [birth year] (71 y.o. M) Treating RN: [name] Primary Care Provider: Fontaine No Other Clinician: Referring Provider: Fontaine No Treating Provider/Extender: Skipper Cliche in Treatment: 28 Information Obtained from: Patient Chief Complaint Left LE ulcers Electronic Signature(s) Signed: 01/11/2022 10:53:25 AM By: Worthy Keeler PA-C Entered By: Worthy Keeler on 01/11/2022 10:53:25 [name] ([id number]) -------------------------------------------------------------------------------- HPI Details Patient Name: [name] Date of Service: 01/11/2022 11:00 AM Medical Record Number: [id number] Patient Account Number: [id number] Date of Birth/Sex: Sep 14, 1950 (71 y.o. M) Treating RN: [name] Primary Care Provider: Fontaine No Other Clinician: Referring Provider: Fontaine No Treating Provider/Extender: Skipper Cliche in Treatment: 41 History of Present Illness HPI Description: 70 year old male who presented to the ER with bilateral lower extremity blisters which had started last week. he has a past medical history of leukemia, diabetes mellitus, hypertension, edema of both lower extremities, his recurrent skin infections, peripheral vascular disease, coronary artery disease, congestive heart failure and peripheral neuropathy. in the ER he was given Rocephin and put on Silvadene cream. he was put on oral doxycycline and was asked to follow-up with the [facility]. His last hemoglobin A1c was 6.6 in December and he checks his blood sugar once a week. He does not have any physicians outside the [place] system. He does not recall any vascular duplex studies done either for arterial or venous disease but was told to  wear compression stockings which he does not use 05/30/2016 -- we have not yet received any of his notes from the [facility] hospital system and his arterial and venous duplex studies are scheduled here in [place] around mid February. We are unable to have his insurance accepted by home health agencies and hence he is getting dressings only once a week. 06/06/16 -- -- I received a call from the patient's PCP at the [facility] and spoke to Dr. Garvin Fila, phone number [phone] and fax number [phone]. She confirmed that no vascular testing was done over the last 5 years and she would be happy to do them if the patient did want them to be done at the [place] and we could fax him a request. Readmission: 71 year old male seen by as in February of this year and was referred to vein and vascular for studies and opinion from the vascular surgeons. The patient returns today with a fresh problem having had blisters on his left lower extremity which have been there for about 5 days and he clearly states that he has been wearing his compression stockings as advised though he could not read the moderate compression and has been wearing light compression. Review of his electronic medical records note that he had lower extremity arterial duplex examination done on 06/23/2016 which showed no hemodynamically significant stenosis in the bilateral lower extremity arterial system. He also had a lower extremity venous reflux examination done on 07/07/2016 and it was noted that he had venous incompetence in the right great saphenous vein and bilateral common femoral veins. Patient was seen by Dr. Tamala Julian on the same day and for some reason his notes do not reflect the venous studies or the arterial studies and he recommended patient do a venous duplex ultrasound to look for reflux and return to see him.he would also consider a lymph pump if required. The patient was told that his workup  was normal and hence the  patient canceled his follow-up appointment. 02/03/17 on evaluation today patient left medial lower extremity blister appears to be doing about the same. It is still continuing to drain and there's still the blistered skin covering the wound bed which is making it difficult for the alternate to do its job. Fortunately there is no evidence of cellulitis. No fevers chills noted. Patient states in general he is not having any significant discomfort. Patient's lower extremity arterial duplex exam revealed that patient was hemodynamically stable with no evidence of stenosis in regard to the bilateral lower extremities. The lower extremity venous reflux exam revealed the patient had venous incontinence noted in the right greater saphenous and bilateral common femoral vein. There is no evidence of deep or superficial vein thrombosis in the bilateral lower extremities. Readmission: 11/12/18 Patient presents for evaluation our clinic today concerning issues that he is having with his left lower extremity. He tells me that a couple weeks ago he began developing blisters on the left lower extremity along with increased swelling. He typically wears his compression stockings on a regular basis is previously been evaluated both here as well is with vascular surgery they would recommend lymphedema pumps but unfortunately that somehow fell through and he never heard anything back from that. Nonetheless I think lymphedema pumps would be beneficial for this patient. He does have a history of hypertension and diabetes. Obviously the chronic venous stasis and lymphedema as well. At this point the blisters have been given in more trouble he states sometimes when the blisters openings able to clean it down with alcohol and it will dry out and do well. Unfortunately that has not been the case this time. He is having some discomfort although this mean these with cleaning the areas he doesn't have discomfort just on a regular  basis. He has not been able to wear his compression stockings since the blisters arose due to the fact that of course it will drain into the socks causing additional issues and he didn't have any way to wrap this otherwise. He has increased to taking his Lasix every day instead of every other day. He sees his primary care provider later this month as well. No fevers, chills, nausea, or vomiting noted at this time. 11/19/18-Patient returns at 1 week, per intake RN the amount of seepage into the compression wraps was definitely improved, overall all the wounds are measuring smaller but continuing silver alginate to the wounds as primary dressing 11/26/18 on evaluation today patient appears to be doing quite well in regard to his left lower Trinity ulcers. In fact of the areas that were noted initially he only has two regions still open. There is no evidence of active infection at this time. He still is not heard anything from the company regarding lymphedema pumps as of yet. Again as previously seen vascular they have not recommended any surgical intervention. 12/03/2018 on evaluation today patient actually appears to be doing quite well with regard to his lower extremity ulcers. In fact most of the areas appear to be healed the one spot which does not seem to be completely healed I am unsure of whether or not this is really draining that much but nonetheless there does not appear to be any signs of infection or significant drainage at this point. There is no sign of fever, chills, nausea, vomiting, or diarrhea. Overall I am pleased with how things have progressed I think is very close to being able to transition  to his home compression stockings. SADIEL, MOTA ([id number]) 12/10/2018 upon evaluation today patient appears to be doing quite well with regard to his left lower extremity. He has been tolerating the dressing changes without complication. Fortunately there is no signs of active infection at this  time. He appears after thorough evaluation of his leg to only have 1 small area that remains open at this point everything else appears to be almost completely closed. He still have significant swelling of the left lower extremity. We had discussed discussing this with his primary care provider he is not able to see her in person they were at the [facility] and right now the [place] is not seeing patients on site. According to the patient anyway. Subsequently he did speak with her apparently and his primary care provider feels that he may likely have a DVT. With that being said she has not seen his leg she is just going off of his history. Nonetheless that is a concern that the patient now has as well and while I do not feel the DVT is likely we can definitely ensure that that is not the case I will go ahead and see about putting that order in today. Nonetheless otherwise I am in a recommend that we continue with the current wound care measures including the compression therapy most likely. We just need to ensure that his leg is indeed free of any DVTs. 12/17/2018 on evaluation today patient actually appears to be completely healed today. He does have 2 very small areas of blistering although this is not anything too significant at this point which is good news. With that being said I am in agreement with the fact that I think he is completely healed at this point. He does want to get back into his compression stocking. The good news is we have gotten approval from insurance for his lymphedema pumps we received a letter since last saw him last week. The other good news is his study did come back and showed no evidence of a DVT. 12/20/2018 on evaluation today patient presents for follow-up concerning his ongoing issues with his left lower extremity. He was actually discharged last Friday and did fairly well until he states blisters opened this morning. He tells me he has been wearing his compression  stocking although he has a hard time getting this on. There does not appear to be any signs of active infection at this time. No fevers, chills, nausea, vomiting, or diarrhea. 12/27/2018 on evaluation today patient appears to be doing very well with regard to his swelling of the left lower extremity the 4 layer compression wrap seems to have been beneficial for him. Fortunately there is no signs of active infection at this time. Patient has been tolerating the compression wrap without complication and his foot swelling in particular appears to be greatly improved. He does still have a wound on the lateral portion of his left leg I believe this is more of a blister that has now reopened. 01/03/2019 on evaluation today patient actually appears to be doing excellent in regard to his left lower extremity. He did receive his compression pumps and is actually use this 7 times since he was last here in the office. On top of the compression wrap he is now roughly 3 cm better at the calf and 2 cm better at the ankle he also states that his foot seem to go an issue better without even having to use a shoe horn. Obviously I  think this is all evidence that he is doing excellent in this regard. The other good news is he does not appear to have anything open today as far as wounds are concerned. 01/15/2019 on evaluation today patient appears to be doing more poorly yet again with regard to his left lower extremity. He has developed new wounds again after being discharged just recently. Unfortunately this continues to be the case that he will heal and then have subsequent new wounds. The last time I was hopeful that he may not end up coming back too quickly especially since he states he has been using his lymphedema pumps along with wearing his compression. Nonetheless he had a blister on the back of his leg that popped up on the left and this has opened up into an ulceration it is quite painful. 01/22/19 on evaluation  today patient actually appears to be doing well with regard to his wound on the left lower extremity. He's been tolerating the dressing changes without complication including the compression wrap in the wound appears to be significantly smaller today which is great news. Overall very pleased in this regard. 01/29/2019 on evaluation today patient appears to be doing well with regard to his left posterior lower extremity ulcer. He has been tolerating the dressing changes without complication. This is not completely healed but is getting much closer. We did order a Farrow wrap 4000 for him he has received this and has it with him today although I am not sure we are quite ready to start him on that as of yet. We are very close. 02/05/2019 on evaluation today patient actually appears to be doing quite well with regard to his left posterior lower extremity ulcer. He still has a very tiny opening remaining but the fortunate thing is he seems to be healing quite nicely. He also did get his Farrow wrap which I am hoping will help with his edema control as well at home. Fortunately there is no evidence of active infection. 02/12/2019 patient and fortunately appears to be doing poorly in regard to his wounds of the left lower extremity. He was very close to healing therefore we attempted to use his Velcro compression wraps continuing with lymphedema pumps at home. Unfortunately that does not seem to have done very well for him. He tells me that he wore them all the time but again I am not sure why if that is the case that he is having such significant edema. He is still on his fluid pills as well. With that being said there is no obvious sign of infection although I do wonder about the possibility of infection at this time as well. 02/19/2019 unfortunately upon evaluation today patient appears to be doing more poorly with regard to his left lower extremity. He is not showing signs of significant improvement and I  think the biggest issue here is that he does have an infection that appears to likely be Pseudomonas. That is based on the blue-green drainage that were noted at this time. Unfortunately the antibiotic that has been on is not going to take care of this at all. I think they will get a need to switch him to either Levaquin or Cipro and this was discussed with the patient. 02/26/2019 on evaluation today patient's lower extremity on the left appears to be doing significantly better as compared to last evaluation. Fortunately there is no signs of active infection at this time. He has been tolerating the compression wrap without complication in  fact he made it the whole week at this point. He is showing signs of excellent improvement I am very happy in this regard. With that being said he is having some issues with infection we did review the results of his culture which I noted today. He did have a positive finding for Enterobacter as well as Alcaligenes faecalis. Fortunately the Levaquin that I placed him on will work for both which is great news. There is no signs of systemic infection at this point. 10/30; left posterior leg wound in the setting of very significant edema and what looks like chronic venous inflammation. He has compression pumps but does not use them. We have been using 3 layer compression. Silver alginate to the wound as the primary dressing 03/18/2019 on evaluation today patient appears to be doing a little better compared to last time I saw him. He really has not been using his compression pumps he tells me that he is having too much discomfort. He has been keeping his wraps on however. He is only been taking his fluid pills every other day because he states they are not really helping and he has an appointment with his primary care provider at the [facility] tomorrow. Subsequently the wound itself on the left lower extremity does seem to be greatly improved compared to previous. 03/25/2019 on  evaluation today patient appears to be doing better with regard to his wounds on the bilateral lower extremities. The left is doing excellent the right is also doing better although both still do show some signs of open wounds noted at this point unfortunately. Fortunately there is no signs of active infection at this time. The patient also is not really having any significant pain which is good news. Unfortunately there was some confusion with the referral on vascular disease and as far as getting the patient scheduled there can be contacting him later today to do this KHYAN, OATS ([id number]) fortunately we got this straightened out. 04/01/2019 on evaluation today patient appears to be doing no fevers, chills, nausea, vomiting, or diarrhea. Excellent at this time with regard to his lower extremities. There does not appear to be any open wound at this point which is good news. Fortunately is also no signs of active infection at this time. Overall feel like the patient has done excellent with the compression the problem is every time we got him to this point and then subsequently go to using his own compression things just go right back to where they were. I am not sure how to address this we can try to get an appointment with vascular for 2 weeks now they have yet to call him. Obviously this has become frustrating for the patient as well. I think the issue has just been an honest error as far as scheduling is concerned but nonetheless still worn out the point where I am unsure of which direction we should take. 04/08/2019 on evaluation today patient actually appears to be doing well with regard to his lower extremities. There are no open wounds at this time and things seem to be managing quite nicely as far as the overall edema control is concerned. With that being said he does have his compression socks today for [place] to go ahead and reinitiate therapy in that manner at this point. He is going to be  going for shoes to be measured on Wednesday and then coincidentally he will also be seeing vascular on Thursday. Overall I think this is good news and  again I am hopeful that they will be able to do something for him to help prevent ongoing issues with edema control as well. No fevers, chills, nausea, vomiting, or diarrhea. 04/11/2019 on evaluation today patient actually appears to be doing poorly after just being discharged on Monday of this week. He had been experiencing issues with again blisters especially on the left lower extremity. With that being said he was completely healed and appeared to be doing great this past Monday. He then subsequently has new blisters that formed before his appointment with vascular this morning. He was also measured for shoes in the interim. With that being said we may have figured out what exactly is going on and why he continues to have issues like what we are seeing at this point. He takes his compression stockings off at nighttime and then he ends up having to sleep in his chair for 5-6 hours a night. He sleeps with his feet down he cannot really get him up in the recliner and therefore he is sleeping and the worst possible his position with his feet on the floor for that majority of the time. Again as I explained to him that is about one third at minimum at least one fourth of his day that he spending with his feet dangling down on the ground and the worst possible position they could be. I think this may be what is causing the issue. Subsequently I am leaning toward thinking that he may need a hospital bed in order to elevate his legs. We likely can have to coordinate this with his primary care provider at the [facility]. Readmission: 01/26/2021 this is a patient who presents for repeat evaluation here in the clinic although it is actually been couple of years since have seen him in fact it was December 2020 when I last saw him. Subsequently he never really  healed but did end up being lost to follow-up. He tells me has been having issues ongoing with his lower extremities has bilateral lower extremity lymphedema no real significant or definitive open wounds but in general his lymphedema is way out of control. We were never able to refer him to lymphedema clinic simply due to the fact to be honest we were never able to get him completely healed. I do not see anyone with open wounds. The patient does have evidence of type 2 diabetes mellitus, lymphedema, chronic venous insufficiency, and hypertension. That really has not changed since his last evaluation. 02/09/2021 upon evaluation today patient appears to be doing a little better in regard to his legs although he still having a tremendous amount of drainage especially on the left leg. Fortunately there does not appear to be any evidence of active infection. Of note when we looked into this further it appears that the patient did not have any absorptive dressing on it was just the 4-layer compression wrap. Nonetheless this is probably big part of the issue here. 10/10; he comes in today with 3 large areas on the upper right lower leg likely remanence of denuded blistering under his compression wraps. He has no other wounds on the right. On the left he has the denuded area on the left medial foot and ankle and on the left dorsal foot. Massive lymphedema in both feet dorsally. Using Zetuvit under compression We have increased home health visitation to twice a week to change the dressings and will change it once 02/22/2021 upon evaluation today patient appears to be doing well currently  with regard to his wounds. He has been tolerating the dressing changes without complication. Fortunately there does not appear to be any evidence of active infection which is great news. No fevers, chills, nausea, vomiting, or diarrhea. The biggest issue I see currently is that home health is not putting any medicine on the  actual wounds before wrapping. 03/01/2021 upon evaluation today the patient's right leg actually appears to be doing quite well which is great news there does not appear to be any evidence of active infection at this time. No fevers, chills, nausea, vomiting, or diarrhea. With that being said the patient is having issues on the left foot where he is having significant drainage is also an ammonia smell he does not have any animals at home and this makes me concerned about a bacteria producing urea as a byproduct. Again the possible common organisms will be E. coli, Proteus, and Enterococcus. All 3 of which can be successfully treated with Levaquin. For that reason I think that this may be a good option for [place] to consider placing him on and I did obtain a culture as well for confirmation sake. 03/08/2021 upon evaluation today patient appears to be doing unfortunately still somewhat poorly in regard to his leg ulcerations. He actually has an area on the right leg where he blistered due to the fact that his wrap slid down and caused an area of pinching on his skin and this has led to a significant issue here. 03/15/2021 upon evaluation today patient unfortunately has not been wrapped appropriately with absorptive dressings nor with the appropriate technique for the third layer of the 4-layer compression wrap. These are issues that we continue to try to address with the home health nurse. Also the absorptive dressing that she had was cut in half and therefore that causes things to leak out it does not actually trap the fluid in regard to the top of the foot overall I think that all these combined are really not seeing things improved significantly here. Fortunately there does not appear to be any signs of significant infection at this time which is good news. He still is having a tremendous amount of drainage. 03/22/2021 upon evaluation today patient appears to be draining tremendously. He still continues  to tell me that he is using his pumps 2 times a day and that coupled with that tells me that he is elevating his legs as well. With that being said all things considered I am really just not seeing the improvement we would expect to see with the 4-layer compression wrap and all the above noted. He in fact had an extremely large Zetuvit dressing on both legs and that they were extremely filled to the max with fluid. This is after just being changed just before the weekend and this is Monday. Nonetheless I am concerned about the fact that there is something going on fairly significant that we cannot get any of this under control and that he is draining this significantly. He supposed be having an echocardiogram it sounds like scheduling has been an issue for him as far as getting in sooner. Its something to do with needing his cousin to drive him because of where it sat and he cannot drive himself to this appointment either way I really think he needs to try to see what he can do about making this happen a little sooner. He tells me he will call today. JAHSHUA, BONITO ([id number]) 03/29/2021 on evaluation today patient appears to be  doing about the same in regard to his legs. He did get his cardiology appointment moved up to 6 December which is at least good that is better than what it was before mid December. Overall very pleased in that regard. 04/05/2021 upon evaluation today patient unfortunately is still doing fairly poorly. There does not appear to be any signs of active infection at this time. No fevers, chills, nausea, vomiting, or diarrhea. Unfortunately I think until his edema is under control and overall fluid overload there is really not to be much chance that I can do much to get him better. This is quite unfortunate and frustrating both for myself and the patient to be perfectly honest. Nonetheless I think that he really needs to have a conversation both with his primary care provider as well  as cardiologist he sees the PCP on Monday and cardiology on Wednesday of next week. 04/13/2021 upon evaluation today patient appears to be doing poorly in regard to his bilateral lower extremities his left is still worse than the right. With that being said he has a tremendous amount of drainage he did see his primary care provider yesterday there really was not much there to be done from their perspective. He sees cardiology tomorrow. Nonetheless my biggest concern here is simply that if we do not get the edema under control he is going to continue to have drainage and honestly I think at some point he is going to become infected severely that is my main concern. 04/19/2021 upon evaluation today patient appears to be doing poorly still in regard to his legs. Unfortunately there does not appear to be any signs of infection at this point. He does have a tremendous amount of drainage however. We have not seen the results back from the cardiologist and the echocardiogram that was done. It appears that the patient checked out okay as far as that is concerned with regard to ejection fraction though we still have some issues here to be honest with his diastolic function. I am unsure if this is accounting for everything that we are seeing or not. Either way he has a tremendous amount of drainage from his legs that we are just not able to control in the outpatient setting at this point. I have reached out to Dr. Rockey Situ his cardiologist to see once he reviews the sheet if there is anything that he feels like can be done from an outpatient perspective if not then I think the way to go is probably can to be through inpatient admission and diuresis. Otherwise I am not sure how working to get this under control we tried antibiotics, compression wrapping, and I have told the patient to be elevating his legs I am not sure how much he does of this but either way I think that this is still an ongoing issue  nonetheless. 04/26/2021 upon evaluation today patient appears to be doing poorly in regard to his legs. He is having a tremendous amount of fluid at this point which is quite unfortunate. Its to the point that he may have had at least 5 to 10 pounds of fluid in his dressings this morning when they were removed these were changed this Friday. Subsequently I think he needs to go to the ER for further evaluation and treatment I think is probably can need diuresis possibly even IV antibiotics been on what the blood work looks like but in general I feel like he needs something to get this under control from  an outpatient perspective absent of everywhere I can think of and I cannot get this under control with our traditional measures. I think this is going require more so that we can get him better 12/30; this is a patient with severe bilateral lymphedema. He was hospitalized from 04/26/2021 through 04/29/2021 treated for cellulitis in the setting of lower extremity ulcers and lymphedema. After he left the hospital he is apparently seen for nurse visit our staff contacted cardiology and he has been started on Lasix 40 mg. Apparently his legs have less edema. Lab work from 05/04/2021 showed a BUN of 38 and creatinine of 1.59 these are elevated versus previous where his creatinine seems to have been 1.30 on 12/19 his potassium is 4.3. I believe the lab work is being followed by cardiology We have him in a 4-layer wrap. Xeroform on the leg wounds and sit to fit on the Berry damage skin on the left dorsal foot versus right dorsal foot. He has compression pumps but does not use them. We have apparently not yet ordered him compression stockings 05/17/2021 upon evaluation today patient's legs though better than last time I personally saw him appear to be getting worse compared to where they were previous. Dr. Quentin Cornwall was actually last 1 to see you I have not seen him since 19 December. That was before he went into  the ER. Coming out apparently his legs looked also and they still look better but not as good as they were in the past. 1/16; patient with severe bilateral lymphedema. Severe scaled hyperkeratotic skin on the dorsal aspect of his distal left foot and left medial ankle.. On the right side changes are not as bad. He did not have any weeping edema. Our intake nurse was convinced that he is being compliant with compression pumps 1 hour twice a day 05/31/2021 upon evaluation today patient actually appears to be doing a little bit better in my opinion in regard to his feet. I do not see as much drainage and it being just completely wet as it was previous. Fortunately I do not also see any signs of active infection which is great news as well. 06/07/21 Upon inspection patient's wound bed actually showed signs of doing well he is not nearly as weepy and wet as he has been in the past and overall very pleased in that regard. Fortunately I do not see any signs of active infection locally or nor systemically at this time. Which is great news. No fevers, chills, nausea, vomiting, or diarrhea. 06/14/2021 upon evaluation today patient appears to be doing well with regard to his right foot I am pleased in that regard. His left foot is still draining quite a bit despite using lymphedema pumps, 4-layer compression wraps, and he tells me elevating his legs as well. He also has Lasix that he takes twice a day. Nonetheless I believe that this is still good to be an ongoing issue. We have a hard time getting this under control as far as the swelling is concerned. 06/21/2021 upon evaluation today patient appears to be doing decently well in regard to his wounds all things considered. He still has a tremendous amount of drainage and fluid noted at this point. Fortunately I do not see any signs of active infection locally or systemically at this point which is great news. Nonetheless I am unsure where to go and how to do this  as far as trying to limit his swelling and weeping from his toes in particular.  06/28/2021 upon evaluation patient unfortunately continues to have significant drainage from his feet. We have been keeping him in a compression wrap and despite this he still continues to have extreme fluid issues he seen his cardiologist he is seeing the nephrologist. We really cannot find any way to get this under control when he did well was when he was in the hospital and they got some of the fluid away. But outside of that we are just struggling to achieve the long-term goal of getting this under control and keeping it under control unfortunately. 07/05/2021 upon evaluation today patient appears to be doing poorly in regard to his feet. Unfortunately this continues to be a significant issue and to be honest I am really not certain what to do about it. I referred him to Dr. Randol Kern at [facility] and he does have an appointment although it is 12 April. He also sees his primary care provider on 6 April. He did not want to see Dr. Haynes Kerns until after he saw his PCP that is the reason the appointment so far out. There is really not much I can do in that regard. Nonetheless I do think that we are still continue to have significant lymphedema issues with significant mount of weeping in regard to the feet and again this has just become extremely difficult to manage to be honest I am not sure if there is something else that Dr. Haynes Kerns or someone else could recommend he also will be seeing Dr. Dellia Nims in 2 weeks when I am on vacation and at that time I will see if Dr. Dellia Nims has any ideas about where to go from here in the meantime. TARRON, KROLAK ([id number]) 07/12/2021 upon evaluation today patient appears to be doing about as well as can be expected with regard to his feet. He does actually see his kidney doctor this Friday. He also will be seeing his primary care provider on April 4 and then following that around mid April he will be  seeing Dr. Haynes Kerns at [facility] which was a referral made for him. Again my goal is to try to find out some way to fix this and to be perfectly honest we have had some issues with making any good adjustments. When he was in the hospital and greater amounts of Lasix he was able to get this down and it looked much better upon discharge. With that being said right now things just are not doing nearly as good as what they used to be. 3/13; patient presents for follow-up. He has been using his lymphedema pumps over the past week. He reports an increase in his Lasix dose. He has no issues or complaints today. He denies signs of infection. 07/26/2021 upon evaluation today patient appears to be doing better in regard to his feet bilaterally. Both are showing signs of much less drainage which is great news and overall very pleased in that regard. Fortunately there does not appear to be any evidence of active infection locally or systemically at this time. No fevers, chills, nausea, vomiting, or diarrhea. 08/02/2021 upon evaluation today patient appears to be doing well with regard to his feet. Both are showing signs of being drier the right pretty much has not really draining much at all which is great news. The left is not draining anywhere close to his much as it was during the last evaluation. This is excellent news and overall very pleased. 08/09/2021 upon evaluation today patient appears to be doing well with regard to  his legs the right leg especially showing signs of excellent improvement which is great news I do not see any evidence of active infection locally or systemically which is great. In regard to the left leg he still has some weeping and drainage but nothing as significant as what it was in the past this is great news. 08-16-2021 upon evaluation today patient appears to actually be doing quite well in my opinion in regard to his feet. This is significantly improved compared to what we previously seen  and overall I am extremely pleased in that regard. I do believe that He is actually improving although this is obviously very slow going. 08-23-2021 upon evaluation today patient appears to be doing well currently in regard to his right leg which actually is pretty dry at this point today. Fortunately I do not see any evidence of active infection at this time which is great news. No fevers, chills, nausea, vomiting, or diarrhea. 08-30-2021 upon evaluation today patient appears to be doing well with regard to his lower extremities. The right foot is pretty much completely dry which is great news the left foot though not completely dry seems to be doing decently well. I do not see any signs of active infection locally or systemically which is great news. No fevers, chills, nausea, vomiting, or diarrhea. 09-06-2021 upon evaluation today patient appears to be doing well with regard to his feet in fact now the left foot is almost completely dry as well and I am definitely seeing a lot of significant improvement. With that being said unfortunately he actually appears to have some cellulitis of his right thigh. His toes are also little bit red but this may just be due to the increased swelling. He really is not warm to touch in regard to the toes. 5/8; excellent edema control on the right foot and lower leg there is no open wounds but we continue to put compression on this otherwise this will breakdown. He is using his compression pumps twice a day at home The area that is problematic is on the left dorsal foot some areas that are not fully epithelialized with very dry fissured skin over this area. 09-20-2021 upon evaluation today patient appears to be doing a little bit worse in regard to swelling at this time. Fortunately I do not see any signs of infection with that being said the wrap was not on quite as well as what I would like to have seen. I do believe that this has caused a little bit of excess swelling  and again we need to try to get this under good control. 09-27-2021 upon evaluation today patient appears to be doing awesome in regard to his feet and legs. Everything is measuring smaller the swelling is down and to be perfectly honest I am extremely pleased with the end of his feet especially on the left side and how dry this is today. I do think we are on the right track here. 5/31; patient presents for follow-up. He is using nystatin powder to the feet bilaterally under 4-layer compression. He has no issues or complaints today. He states he is going to the lymphedema clinic tomorrow. Since he has no open wounds on the right lower extremity they will be focused on the side. 10-11-2021 upon evaluation today patient appears to be doing excellent in regard to his legs. He has been tolerating the dressing changes without complication. Fortunately there does not appear to be any evidence of active infection locally or  systemically at this time which is great news. No fevers, chills, nausea, vomiting, or diarrhea. 10-18-2021 upon evaluation today patient appears to be doing well with regard to his legs. He has been tolerating the dressing changes without complication and actually seen in lymphedema clinic now in regard to his right leg were taken care of the left leg. Fortunately I do not see any evidence of active infection locally or systemically at this time. 10-26-2021 upon evaluation today patient's wounds actually are showing signs of doing well in fact he really does not have wounds as much is weeping of the lower extremities. Fortunately I do not see any evidence of active infection locally or systemically which is great news. No fever or chills noted 11-02-2021 upon evaluation today patient appears to be doing decently well in regard to his legs. Of actually been on the phone quite a bit discussing with [facility] orthotics custom compression for him. I also have been discussing everything with his  lymphedema clinic provider as well. Helene Kelp notes that there is really not much more that she can do for him which was noted last week as well. Subsequently I do believe that the patient would benefit from getting custom compression in fact I think that is what he is going require to keep anything under control here. He voiced understanding. 11-08-2021 upon evaluation today patient appears to be doing excellent in regard to his legs and feet. Fortunately I do not see any signs of infection at this time which is great news. No fever or chills noted again I am still been working on trying to figure out what exactly we need to do as far as getting the [place] involved in coverage for custom compression for this patient. I also discussed with him that he does need to have custom shoes which I completely understand. With that being said it does make it difficult thinking about what kind of combination of shoes and compression is going to be best for him for the long-term. Fortunately I do not see any evidence of active infection locally or systemically which is great news. Overall I think you are doing quite well. 11-16-2021 upon evaluation today patient appears to be doing well currently in regard to his legs and feet. Everything is significantly smaller [place], [name] ([id number]) compared to what has been. Fortunately I do not see any evidence of active infection locally or systemically at this time which is great news. 11-23-2021 upon evaluation today patient appears to be doing well with regard to his legs bilaterally. We can actually initiate treatment with a recommendation today to utilize Tubigrip and try to see if we can get this under better control. The patient is in agreement with the plan. Nonetheless the big question is whether this will be something that he can utilize at home in order to keep his swelling down and allow him to be able to function without having to come into the clinic as frequently.  He notes that he would love to get to this point. He does have a cousin who is willing to help him with change out the Tubigrip when necessary. 7/25; the patient does not have any open wounds however his left leg is a lot more swollen than it was last week. His cousin came in who presumably would be the one to change his juxta lites to practice on doing this for the patient. She apparently lives in the [place] block. In the meantime he has been using Tubigrip 12-07-2021 upon  evaluation today patient appears to be doing well with regard to his legs although we are transitioning to try to get him to use the Tubigrip which again has been a little bit of a setback but not too much overall I think he is still doing well he still able to wear his current shoes which is good news. He does have a cousin who is willing to help him with the Tubigrip and we are training her how to do this as well. She is here today. 12-14-2021 upon evaluation today patient appears to be doing well currently in regard to his right leg which I think is doing excellent with the Tubigrip. With regard to the left leg unfortunately this is not doing nearly as well and will get me to do something to intervene here. I think getting back into a 4-layer wraps can be the way to go short-term To get this back under control and the patient voiced understanding. 12-21-2021 upon evaluation today patient appears to be tolerating the dressing changes currently without complication the wrap of the left leg is doing well the right leg reason Tubigrip. The wound is looking significantly improved. 12-28-2021 upon evaluation today patient appears to be doing well currently in regard to his wound. He has been tolerating the dressing changes without complication. Fortunately there is no evidence of active infection locally or systemically at this time. I think he is very close to complete resolution on the left leg and I think we are ready to go back to the  Tubigrip. 01-04-2022 upon evaluation today patient appears to be doing okay in regard to his legs of the more swollen because he was rolling and pushing the Tubigrip down because it felt "too tight". With that being said I discussed with the patient today that we really do need to keep this pulled up and if he keeps it pulled up and when it starts to feel tight gets to where he can elevate his legs I think that he can keep this under control. I would suggest if it starts feeling tight that he lay down where he can elevate his legs above heart level to basically help the swelling to go down is much as possible. He voiced understanding. I also told him that is can to be his job to keep these pulled up and that if he is not able to do this I Georgina Peer probably have to refer him elsewhere to get a second opinion as I have pretty much done everything I can try to get his legs under control and keep them under control he is much better than where he used to be and his feet can actually fit in his shoes which is great but in general I just do not know what else to do he has his cousin helping with putting the Tubigrip's on which is awesome and a great resource for him but again she cannot be there to keep them pulled up and nor is that even her job. 01-11-2022 upon evaluation today patient's legs are looking much better. He actually kept the Tubigrip up this week which has made a tremendous difference and improvement in his overall appearance today. Fortunately I do not see any signs of active infection locally or systemically at this time. Electronic Signature(s) Signed: 01/11/2022 1:19:00 PM By: Worthy Keeler PA-C Entered By: Worthy Keeler on 01/11/2022 13:19:00 [name] ([id number]) -------------------------------------------------------------------------------- Physical Exam Details Patient Name: [name] Date of Service: 01/11/2022 11:00 AM Medical  Record Number: [id number] Patient Account  Number: [id number] Date of Birth/Sex: February 15, 1951 (71 y.o. M) Treating RN: [name] Primary Care Provider: Fontaine No Other Clinician: Referring Provider: Fontaine No Treating Provider/Extender: Skipper Cliche in Treatment: 63 Constitutional Obese and well-hydrated in no acute distress. Respiratory normal breathing without difficulty. Psychiatric this patient is able to make decisions and demonstrates good insight into disease process. Alert and Oriented x 3. pleasant and cooperative. Notes Upon inspection patient's wound bed actually showed signs of good granulation and epithelization at this point. Fortunately I do not see again any evidence of worsening in general and I think the patient is actually making great progress. No fevers, chills, nausea, vomiting, or diarrhea. Electronic Signature(s) Signed: 01/11/2022 1:19:44 PM By: Worthy Keeler PA-C Entered By: Worthy Keeler on 01/11/2022 13:19:44 [name] ([id number]) -------------------------------------------------------------------------------- Physician Orders Details Patient Name: [name] Date of Service: 01/11/2022 11:00 AM Medical Record Number: [id number] Patient Account Number: [id number] Date of Birth/Sex: 1950/10/01 (71 y.o. M) Treating RN: [name] Primary Care Provider: Fontaine No Other Clinician: Referring Provider: Fontaine No Treating Provider/Extender: Skipper Cliche in Treatment: 62 Verbal / Phone Orders: No Diagnosis Coding ICD-10 Coding Code Description E11.622 Type 2 diabetes mellitus with other skin ulcer I89.0 Lymphedema, not elsewhere classified I87.2 Venous insufficiency (chronic) (peripheral) L97.822 Non-pressure chronic ulcer of other part of left lower leg with fat layer exposed L97.512 Non-pressure chronic ulcer of other part of right foot with fat layer exposed I10 Essential (primary) hypertension L97.811 Non-pressure chronic ulcer of  other part of right lower leg limited to breakdown of skin Follow-up Appointments o Return Appointment in 1 week. o Nurse Visit as needed Bathing/ Shower/ Hygiene o May shower with wound dressing protected with water repellent cover or cast protector. o No tub bath. Anesthetic (Use 'Patient Medications' Section for Anesthetic Order Entry) o Lidocaine applied to wound bed Edema Control - Lymphedema / Segmental Compressive Device / Other Bilateral Lower Extremities o 4 Layer Compression System Lymphedema. - left side with nystatin between toes and silver cell to any weeping areas with tubi c double layer to ankle/foot o Tubigrip double layer applied - Nystatin powder between toes double size d bilat just behind toes to patella knotch , double size C foot ankles enclose toes to just above ankle scell to any weeping areas o Elevate, Exercise Daily and Avoid Standing for Long Periods of Time. o Elevate legs to the level of the heart and pump ankles as often as possible o Elevate leg(s) parallel to the floor when sitting. o Compression Pump: Use compression pump on left lower extremity for 60 minutes, twice daily. - 2 times per day o DO YOUR BEST to sleep in the bed at night. DO NOT sleep in your recliner. Long hours of sitting in a recliner leads to swelling of the legs and/or potential wounds on your backside. o Other: - Contact prescriber regarding use of diuretics to reduce fluid overload. Off-Loading o Turn and reposition every 2 hours Additional Orders / Instructions o Follow Nutritious Diet and Increase Protein Intake Medications-Please add to medication list. o P.O. Antibiotics Electronic Signature(s) Signed: 01/11/2022 4:56:20 PM By: Worthy Keeler PA-C Signed: 01/17/2022 8:13:04 AM By: [name] RN Entered By: [name] on 01/11/2022 12:33:28 [name]  ([id number]) -------------------------------------------------------------------------------- Problem List Details Patient Name: [name] Date of Service: 01/11/2022 11:00 AM Medical Record Number: [id number] Patient Account Number: [id number] Date of Birth/Sex: 04-Dec-1950 (71 y.o. M) Treating RN: [name] Primary Care Provider:  Fontaine No Other Clinician: Referring Provider: Fontaine No Treating Provider/Extender: Skipper Cliche in Treatment: 30 Active Problems ICD-10 Encounter Code Description Active Date MDM Diagnosis E11.622 Type 2 diabetes mellitus with other skin ulcer 01/26/2021 No Yes I89.0 Lymphedema, not elsewhere classified 01/26/2021 No Yes I87.2 Venous insufficiency (chronic) (peripheral) 01/26/2021 No Yes L97.822 Non-pressure chronic ulcer of other part of left lower leg with fat layer 01/26/2021 No Yes exposed L97.512 Non-pressure chronic ulcer of other part of right foot with fat layer 01/26/2021 No Yes exposed [place] (primary) hypertension 01/26/2021 No Yes L97.811 Non-pressure chronic ulcer of other part of right lower leg limited to 02/15/2021 No Yes breakdown of skin Inactive Problems Resolved Problems Electronic Signature(s) Signed: 01/11/2022 10:53:20 AM By: Worthy Keeler PA-C Entered By: Worthy Keeler on 01/11/2022 10:53:20 [name] ([id number]) -------------------------------------------------------------------------------- Progress Note Details Patient Name: [name] Date of Service: 01/11/2022 11:00 AM Medical Record Number: [id number] Patient Account Number: [id number] Date of Birth/Sex: [birth year] (71 y.o. M) Treating RN: [name] Primary Care Provider: Fontaine No Other Clinician: Referring Provider: Fontaine No Treating Provider/Extender: Skipper Cliche in Treatment: 66 Subjective Chief Complaint Information obtained from Patient Left LE ulcers History of Present Illness  (HPI) 70 year old male who presented to the ER with bilateral lower extremity blisters which had started last week. he has a past medical history of leukemia, diabetes mellitus, hypertension, edema of both lower extremities, his recurrent skin infections, peripheral vascular disease, coronary artery disease, congestive heart failure and peripheral neuropathy. in the ER he was given Rocephin and put on Silvadene cream. he was put on oral doxycycline and was asked to follow-up with the [facility]. His last hemoglobin A1c was 6.6 in December and he checks his blood sugar once a week. He does not have any physicians outside the [place] system. He does not recall any vascular duplex studies done either for arterial or venous disease but was told to wear compression stockings which he does not use 05/30/2016 -- we have not yet received any of his notes from the [facility] hospital system and his arterial and venous duplex studies are scheduled here in [place] around mid February. We are unable to have his insurance accepted by home health agencies and hence he is getting dressings only once a week. 06/06/16 -- -- I received a call from the patient's PCP at the [facility] at [facility] and spoke to Dr. Garvin Fila, phone number [phone] and fax number [phone]. She confirmed that no vascular testing was done over the last 5 years and she would be happy to do them if the patient did want them to be done at the [place] and we could fax him a request. Readmission: 71 year old male seen by as in February of this year and was referred to vein and vascular for studies and opinion from the vascular surgeons. The patient returns today with a fresh problem having had blisters on his left lower extremity which have been there for about 5 days and he clearly states that he has been wearing his compression stockings as advised though he could not read the moderate compression and has been wearing light compression. Review of his  electronic medical records note that he had lower extremity arterial duplex examination done on 06/23/2016 which showed no hemodynamically significant stenosis in the bilateral lower extremity arterial system. He also had a lower extremity venous reflux examination done on 07/07/2016 and it was noted that he had venous incompetence in the right great saphenous vein and bilateral  common femoral veins. Patient was seen by Dr. Tamala Julian on the same day and for some reason his notes do not reflect the venous studies or the arterial studies and he recommended patient do a venous duplex ultrasound to look for reflux and return to see him.he would also consider a lymph pump if required. The patient was told that his workup was normal and hence the patient canceled his follow-up appointment. 02/03/17 on evaluation today patient left medial lower extremity blister appears to be doing about the same. It is still continuing to drain and there's still the blistered skin covering the wound bed which is making it difficult for the alternate to do its job. Fortunately there is no evidence of cellulitis. No fevers chills noted. Patient states in general he is not having any significant discomfort. Patient's lower extremity arterial duplex exam revealed that patient was hemodynamically stable with no evidence of stenosis in regard to the bilateral lower extremities. The lower extremity venous reflux exam revealed the patient had venous incontinence noted in the right greater saphenous and bilateral common femoral vein. There is no evidence of deep or superficial vein thrombosis in the bilateral lower extremities. Readmission: 11/12/18 Patient presents for evaluation our clinic today concerning issues that he is having with his left lower extremity. He tells me that a couple weeks ago he began developing blisters on the left lower extremity along with increased swelling. He typically wears his compression stockings on a  regular basis is previously been evaluated both here as well is with vascular surgery they would recommend lymphedema pumps but unfortunately that somehow fell through and he never heard anything back from that. Nonetheless I think lymphedema pumps would be beneficial for this patient. He does have a history of hypertension and diabetes. Obviously the chronic venous stasis and lymphedema as well. At this point the blisters have been given in more trouble he states sometimes when the blisters openings able to clean it down with alcohol and it will dry out and do well. Unfortunately that has not been the case this time. He is having some discomfort although this mean these with cleaning the areas he doesn't have discomfort just on a regular basis. He has not been able to wear his compression stockings since the blisters arose due to the fact that of course it will drain into the socks causing additional issues and he didn't have any way to wrap this otherwise. He has increased to taking his Lasix every day instead of every other day. He sees his primary care provider later this month as well. No fevers, chills, nausea, or vomiting noted at this time. 11/19/18-Patient returns at 1 week, per intake RN the amount of seepage into the compression wraps was definitely improved, overall all the wounds are measuring smaller but continuing silver alginate to the wounds as primary dressing 11/26/18 on evaluation today patient appears to be doing quite well in regard to his left lower [place] ulcers. In fact of the areas that were noted initially he only has two regions still open. There is no evidence of active infection at this time. He still is not heard anything from the company regarding lymphedema pumps as of yet. Again as previously seen vascular they have not recommended any surgical intervention. KHYRE, GERMOND ([id number]) 12/03/2018 on evaluation today patient actually appears to be doing quite well with  regard to his lower extremity ulcers. In fact most of the areas appear to be healed the one spot which does not seem  to be completely healed I am unsure of whether or not this is really draining that much but nonetheless there does not appear to be any signs of infection or significant drainage at this point. There is no sign of fever, chills, nausea, vomiting, or diarrhea. Overall I am pleased with how things have progressed I think is very close to being able to transition to his home compression stockings. 12/10/2018 upon evaluation today patient appears to be doing quite well with regard to his left lower extremity. He has been tolerating the dressing changes without complication. Fortunately there is no signs of active infection at this time. He appears after thorough evaluation of his leg to only have 1 small area that remains open at this point everything else appears to be almost completely closed. He still have significant swelling of the left lower extremity. We had discussed discussing this with his primary care provider he is not able to see her in person they were at the [facility] and right now the [place] is not seeing patients on site. According to the patient anyway. Subsequently he did speak with her apparently and his primary care provider feels that he may likely have a DVT. With that being said she has not seen his leg she is just going off of his history. Nonetheless that is a concern that the patient now has as well and while I do not feel the DVT is likely we can definitely ensure that that is not the case I will go ahead and see about putting that order in today. Nonetheless otherwise I am in a recommend that we continue with the current wound care measures including the compression therapy most likely. We just need to ensure that his leg is indeed free of any DVTs. 12/17/2018 on evaluation today patient actually appears to be completely healed today. He does have 2 very small areas  of blistering although this is not anything too significant at this point which is good news. With that being said I am in agreement with the fact that I think he is completely healed at this point. He does want to get back into his compression stocking. The good news is we have gotten approval from insurance for his lymphedema pumps we received a letter since last saw him last week. The other good news is his study did come back and showed no evidence of a DVT. 12/20/2018 on evaluation today patient presents for follow-up concerning his ongoing issues with his left lower extremity. He was actually discharged last Friday and did fairly well until he states blisters opened this morning. He tells me he has been wearing his compression stocking although he has a hard time getting this on. There does not appear to be any signs of active infection at this time. No fevers, chills, nausea, vomiting, or diarrhea. 12/27/2018 on evaluation today patient appears to be doing very well with regard to his swelling of the left lower extremity the 4 layer compression wrap seems to have been beneficial for him. Fortunately there is no signs of active infection at this time. Patient has been tolerating the compression wrap without complication and his foot swelling in particular appears to be greatly improved. He does still have a wound on the lateral portion of his left leg I believe this is more of a blister that has now reopened. 01/03/2019 on evaluation today patient actually appears to be doing excellent in regard to his left lower extremity. He did receive his compression  pumps and is actually use this 7 times since he was last here in the office. On top of the compression wrap he is now roughly 3 cm better at the calf and 2 cm better at the ankle he also states that his foot seem to go an issue better without even having to use a shoe horn. Obviously I think this is all evidence that he is doing excellent in this  regard. The other good news is he does not appear to have anything open today as far as wounds are concerned. 01/15/2019 on evaluation today patient appears to be doing more poorly yet again with regard to his left lower extremity. He has developed new wounds again after being discharged just recently. Unfortunately this continues to be the case that he will heal and then have subsequent new wounds. The last time I was hopeful that he may not end up coming back too quickly especially since he states he has been using his lymphedema pumps along with wearing his compression. Nonetheless he had a blister on the back of his leg that popped up on the left and this has opened up into an ulceration it is quite painful. 01/22/19 on evaluation today patient actually appears to be doing well with regard to his wound on the left lower extremity. He's been tolerating the dressing changes without complication including the compression wrap in the wound appears to be significantly smaller today which is great news. Overall very pleased in this regard. 01/29/2019 on evaluation today patient appears to be doing well with regard to his left posterior lower extremity ulcer. He has been tolerating the dressing changes without complication. This is not completely healed but is getting much closer. We did order a Farrow wrap 4000 for him he has received this and has it with him today although I am not sure we are quite ready to start him on that as of yet. We are very close. 02/05/2019 on evaluation today patient actually appears to be doing quite well with regard to his left posterior lower extremity ulcer. He still has a very tiny opening remaining but the fortunate thing is he seems to be healing quite nicely. He also did get his Farrow wrap which I am hoping will help with his edema control as well at home. Fortunately there is no evidence of active infection. 02/12/2019 patient and fortunately appears to be doing poorly  in regard to his wounds of the left lower extremity. He was very close to healing therefore we attempted to use his Velcro compression wraps continuing with lymphedema pumps at home. Unfortunately that does not seem to have done very well for him. He tells me that he wore them all the time but again I am not sure why if that is the case that he is having such significant edema. He is still on his fluid pills as well. With that being said there is no obvious sign of infection although I do wonder about the possibility of infection at this time as well. 02/19/2019 unfortunately upon evaluation today patient appears to be doing more poorly with regard to his left lower extremity. He is not showing signs of significant improvement and I think the biggest issue here is that he does have an infection that appears to likely be Pseudomonas. That is based on the blue-green drainage that were noted at this time. Unfortunately the antibiotic that has been on is not going to take care of this at all. I think they  will get a need to switch him to either Levaquin or Cipro and this was discussed with the patient. 02/26/2019 on evaluation today patient's lower extremity on the left appears to be doing significantly better as compared to last evaluation. Fortunately there is no signs of active infection at this time. He has been tolerating the compression wrap without complication in fact he made it the whole week at this point. He is showing signs of excellent improvement I am very happy in this regard. With that being said he is having some issues with infection we did review the results of his culture which I noted today. He did have a positive finding for Enterobacter as well as Alcaligenes faecalis. Fortunately the Levaquin that I placed him on will work for both which is great news. There is no signs of systemic infection at this point. 10/30; left posterior leg wound in the setting of very significant edema and  what looks like chronic venous inflammation. He has compression pumps but does not use them. We have been using 3 layer compression. Silver alginate to the wound as the primary dressing 03/18/2019 on evaluation today patient appears to be doing a little better compared to last time I saw him. He really has not been using his compression pumps he tells me that he is having too much discomfort. He has been keeping his wraps on however. He is only been taking his fluid pills every other day because he states they are not really helping and he has an appointment with his primary care provider at the [facility] tomorrow. Subsequently the wound itself on the left lower extremity does seem to be greatly improved compared to previous. TERRIE, GRAJALES ([id number]) 03/25/2019 on evaluation today patient appears to be doing better with regard to his wounds on the bilateral lower extremities. The left is doing excellent the right is also doing better although both still do show some signs of open wounds noted at this point unfortunately. Fortunately there is no signs of active infection at this time. The patient also is not really having any significant pain which is good news. Unfortunately there was some confusion with the referral on vascular disease and as far as getting the patient scheduled there can be contacting him later today to do this fortunately we got this straightened out. 04/01/2019 on evaluation today patient appears to be doing no fevers, chills, nausea, vomiting, or diarrhea. Excellent at this time with regard to his lower extremities. There does not appear to be any open wound at this point which is good news. Fortunately is also no signs of active infection at this time. Overall feel like the patient has done excellent with the compression the problem is every time we got him to this point and then subsequently go to using his own compression things just go right back to where they were. I am not sure  how to address this we can try to get an appointment with vascular for 2 weeks now they have yet to call him. Obviously this has become frustrating for the patient as well. I think the issue has just been an honest error as far as scheduling is concerned but nonetheless still worn out the point where I am unsure of which direction we should take. 04/08/2019 on evaluation today patient actually appears to be doing well with regard to his lower extremities. There are no open wounds at this time and things seem to be managing quite nicely as far as the overall  edema control is concerned. With that being said he does have his compression socks today for [place] to go ahead and reinitiate therapy in that manner at this point. He is going to be going for shoes to be measured on Wednesday and then coincidentally he will also be seeing vascular on Thursday. Overall I think this is good news and again I am hopeful that they will be able to do something for him to help prevent ongoing issues with edema control as well. No fevers, chills, nausea, vomiting, or diarrhea. 04/11/2019 on evaluation today patient actually appears to be doing poorly after just being discharged on Monday of this week. He had been experiencing issues with again blisters especially on the left lower extremity. With that being said he was completely healed and appeared to be doing great this past Monday. He then subsequently has new blisters that formed before his appointment with vascular this morning. He was also measured for shoes in the interim. With that being said we may have figured out what exactly is going on and why he continues to have issues like what we are seeing at this point. He takes his compression stockings off at nighttime and then he ends up having to sleep in his chair for 5-6 hours a night. He sleeps with his feet down he cannot really get him up in the recliner and therefore he is sleeping and the worst possible  his position with his feet on the floor for that majority of the time. Again as I explained to him that is about one third at minimum at least one fourth of his day that he spending with his feet dangling down on the ground and the worst possible position they could be. I think this may be what is causing the issue. Subsequently I am leaning toward thinking that he may need a hospital bed in order to elevate his legs. We likely can have to coordinate this with his primary care provider at the [facility]. Readmission: 01/26/2021 this is a patient who presents for repeat evaluation here in the clinic although it is actually been couple of years since have seen him in fact it was December 2020 when I last saw him. Subsequently he never really healed but did end up being lost to follow-up. He tells me has been having issues ongoing with his lower extremities has bilateral lower extremity lymphedema no real significant or definitive open wounds but in general his lymphedema is way out of control. We were never able to refer him to lymphedema clinic simply due to the fact to be honest we were never able to get him completely healed. I do not see anyone with open wounds. The patient does have evidence of type 2 diabetes mellitus, lymphedema, chronic venous insufficiency, and hypertension. That really has not changed since his last evaluation. 02/09/2021 upon evaluation today patient appears to be doing a little better in regard to his legs although he still having a tremendous amount of drainage especially on the left leg. Fortunately there does not appear to be any evidence of active infection. Of note when we looked into this further it appears that the patient did not have any absorptive dressing on it was just the 4-layer compression wrap. Nonetheless this is probably big part of the issue here. 10/10; he comes in today with 3 large areas on the upper right lower leg likely remanence of denuded  blistering under his compression wraps. He has no other wounds on the  right. On the left he has the denuded area on the left medial foot and ankle and on the left dorsal foot. Massive lymphedema in both feet dorsally. Using Zetuvit under compression We have increased home health visitation to twice a week to change the dressings and will change it once 02/22/2021 upon evaluation today patient appears to be doing well currently with regard to his wounds. He has been tolerating the dressing changes without complication. Fortunately there does not appear to be any evidence of active infection which is great news. No fevers, chills, nausea, vomiting, or diarrhea. The biggest issue I see currently is that home health is not putting any medicine on the actual wounds before wrapping. 03/01/2021 upon evaluation today the patient's right leg actually appears to be doing quite well which is great news there does not appear to be any evidence of active infection at this time. No fevers, chills, nausea, vomiting, or diarrhea. With that being said the patient is having issues on the left foot where he is having significant drainage is also an ammonia smell he does not have any animals at home and this makes me concerned about a bacteria producing urea as a byproduct. Again the possible common organisms will be E. coli, Proteus, and Enterococcus. All 3 of which can be successfully treated with Levaquin. For that reason I think that this may be a good option for [place] to consider placing him on and I did obtain a culture as well for confirmation sake. 03/08/2021 upon evaluation today patient appears to be doing unfortunately still somewhat poorly in regard to his leg ulcerations. He actually has an area on the right leg where he blistered due to the fact that his wrap slid down and caused an area of pinching on his skin and this has led to a significant issue here. 03/15/2021 upon evaluation today patient  unfortunately has not been wrapped appropriately with absorptive dressings nor with the appropriate technique for the third layer of the 4-layer compression wrap. These are issues that we continue to try to address with the home health nurse. Also the absorptive dressing that she had was cut in half and therefore that causes things to leak out it does not actually trap the fluid in regard to the top of the foot overall I think that all these combined are really not seeing things improved significantly here. Fortunately there does not appear to be any signs of significant infection at this time which is good news. He still is having a tremendous amount of drainage. 03/22/2021 upon evaluation today patient appears to be draining tremendously. He still continues to tell me that he is using his pumps 2 times a day and that coupled with that tells me that he is elevating his legs as well. With that being said all things considered I am really just not seeing the improvement we would expect to see with the 4-layer compression wrap and all the above noted. He in fact had an extremely large Zetuvit dressing on both legs and that they were extremely filled to the max with fluid. This is after just being changed just before the weekend and this ASIR, BINGLEY ([id number]) is Monday. Nonetheless I am concerned about the fact that there is something going on fairly significant that we cannot get any of this under control and that he is draining this significantly. He supposed be having an echocardiogram it sounds like scheduling has been an issue for him as far as getting in sooner.  Its something to do with needing his cousin to drive him because of where it sat and he cannot drive himself to this appointment either way I really think he needs to try to see what he can do about making this happen a little sooner. He tells me he will call today. 03/29/2021 on evaluation today patient appears to be doing about the  same in regard to his legs. He did get his cardiology appointment moved up to 6 December which is at least good that is better than what it was before mid December. Overall very pleased in that regard. 04/05/2021 upon evaluation today patient unfortunately is still doing fairly poorly. There does not appear to be any signs of active infection at this time. No fevers, chills, nausea, vomiting, or diarrhea. Unfortunately I think until his edema is under control and overall fluid overload there is really not to be much chance that I can do much to get him better. This is quite unfortunate and frustrating both for myself and the patient to be perfectly honest. Nonetheless I think that he really needs to have a conversation both with his primary care provider as well as cardiologist he sees the PCP on Monday and cardiology on Wednesday of next week. 04/13/2021 upon evaluation today patient appears to be doing poorly in regard to his bilateral lower extremities his left is still worse than the right. With that being said he has a tremendous amount of drainage he did see his primary care provider yesterday there really was not much there to be done from their perspective. He sees cardiology tomorrow. Nonetheless my biggest concern here is simply that if we do not get the edema under control he is going to continue to have drainage and honestly I think at some point he is going to become infected severely that is my main concern. 04/19/2021 upon evaluation today patient appears to be doing poorly still in regard to his legs. Unfortunately there does not appear to be any signs of infection at this point. He does have a tremendous amount of drainage however. We have not seen the results back from the cardiologist and the echocardiogram that was done. It appears that the patient checked out okay as far as that is concerned with regard to ejection fraction though we still have some issues here to be honest with  his diastolic function. I am unsure if this is accounting for everything that we are seeing or not. Either way he has a tremendous amount of drainage from his legs that we are just not able to control in the outpatient setting at this point. I have reached out to Dr. Rockey Situ his cardiologist to see once he reviews the sheet if there is anything that he feels like can be done from an outpatient perspective if not then I think the way to go is probably can to be through inpatient admission and diuresis. Otherwise I am not sure how working to get this under control we tried antibiotics, compression wrapping, and I have told the patient to be elevating his legs I am not sure how much he does of this but either way I think that this is still an ongoing issue nonetheless. 04/26/2021 upon evaluation today patient appears to be doing poorly in regard to his legs. He is having a tremendous amount of fluid at this point which is quite unfortunate. Its to the point that he may have had at least 5 to 10 pounds of fluid in his  dressings this morning when they were removed these were changed this Friday. Subsequently I think he needs to go to the ER for further evaluation and treatment I think is probably can need diuresis possibly even IV antibiotics been on what the blood work looks like but in general I feel like he needs something to get this under control from an outpatient perspective absent of everywhere I can think of and I cannot get this under control with our traditional measures. I think this is going require more so that we can get him better 12/30; this is a patient with severe bilateral lymphedema. He was hospitalized from 04/26/2021 through 04/29/2021 treated for cellulitis in the setting of lower extremity ulcers and lymphedema. After he left the hospital he is apparently seen for nurse visit our staff contacted cardiology and he has been started on Lasix 40 mg. Apparently his legs have less edema.  Lab work from 05/04/2021 showed a BUN of 38 and creatinine of 1.59 these are elevated versus previous where his creatinine seems to have been 1.30 on 12/19 his potassium is 4.3. I believe the lab work is being followed by cardiology We have him in a 4-layer wrap. Xeroform on the leg wounds and sit to fit on the Berry damage skin on the left dorsal foot versus right dorsal foot. He has compression pumps but does not use them. We have apparently not yet ordered him compression stockings 05/17/2021 upon evaluation today patient's legs though better than last time I personally saw him appear to be getting worse compared to where they were previous. Dr. Quentin Cornwall was actually last 1 to see you I have not seen him since 19 December. That was before he went into the ER. Coming out apparently his legs looked also and they still look better but not as good as they were in the past. 1/16; patient with severe bilateral lymphedema. Severe scaled hyperkeratotic skin on the dorsal aspect of his distal left foot and left medial ankle.. On the right side changes are not as bad. He did not have any weeping edema. Our intake nurse was convinced that he is being compliant with compression pumps 1 hour twice a day 05/31/2021 upon evaluation today patient actually appears to be doing a little bit better in my opinion in regard to his feet. I do not see as much drainage and it being just completely wet as it was previous. Fortunately I do not also see any signs of active infection which is great news as well. 06/07/21 Upon inspection patient's wound bed actually showed signs of doing well he is not nearly as weepy and wet as he has been in the past and overall very pleased in that regard. Fortunately I do not see any signs of active infection locally or nor systemically at this time. Which is great news. No fevers, chills, nausea, vomiting, or diarrhea. 06/14/2021 upon evaluation today patient appears to be doing well with  regard to his right foot I am pleased in that regard. His left foot is still draining quite a bit despite using lymphedema pumps, 4-layer compression wraps, and he tells me elevating his legs as well. He also has Lasix that he takes twice a day. Nonetheless I believe that this is still good to be an ongoing issue. We have a hard time getting this under control as far as the swelling is concerned. 06/21/2021 upon evaluation today patient appears to be doing decently well in regard to his wounds all things considered.  He still has a tremendous amount of drainage and fluid noted at this point. Fortunately I do not see any signs of active infection locally or systemically at this point which is great news. Nonetheless I am unsure where to go and how to do this as far as trying to limit his swelling and weeping from his toes in particular. 06/28/2021 upon evaluation patient unfortunately continues to have significant drainage from his feet. We have been keeping him in a compression wrap and despite this he still continues to have extreme fluid issues he seen his cardiologist he is seeing the nephrologist. We really cannot find any way to get this under control when he did well was when he was in the hospital and they got some of the fluid away. But outside of that we are just struggling to achieve the long-term goal of getting this under control and keeping it under control unfortunately. 07/05/2021 upon evaluation today patient appears to be doing poorly in regard to his feet. Unfortunately this continues to be a significant issue and to be honest I am really not certain what to do about it. I referred him to Dr. Randol Kern at [facility] and he does have an appointment although it is SALIL, RAINERI ([id number]) 12 April. He also sees his primary care provider on 6 April. He did not want to see Dr. Haynes Kerns until after he saw his PCP that is the reason the appointment so far out. There is really not much I can do in  that regard. Nonetheless I do think that we are still continue to have significant lymphedema issues with significant mount of weeping in regard to the feet and again this has just become extremely difficult to manage to be honest I am not sure if there is something else that Dr. Haynes Kerns or someone else could recommend he also will be seeing Dr. Dellia Nims in 2 weeks when I am on vacation and at that time I will see if Dr. Dellia Nims has any ideas about where to go from here in the meantime. 07/12/2021 upon evaluation today patient appears to be doing about as well as can be expected with regard to his feet. He does actually see his kidney doctor this Friday. He also will be seeing his primary care provider on April 4 and then following that around mid April he will be seeing Dr. Haynes Kerns at [facility] which was a referral made for him. Again my goal is to try to find out some way to fix this and to be perfectly honest we have had some issues with making any good adjustments. When he was in the hospital and greater amounts of Lasix he was able to get this down and it looked much better upon discharge. With that being said right now things just are not doing nearly as good as what they used to be. 3/13; patient presents for follow-up. He has been using his lymphedema pumps over the past week. He reports an increase in his Lasix dose. He has no issues or complaints today. He denies signs of infection. 07/26/2021 upon evaluation today patient appears to be doing better in regard to his feet bilaterally. Both are showing signs of much less drainage which is great news and overall very pleased in that regard. Fortunately there does not appear to be any evidence of active infection locally or systemically at this time. No fevers, chills, nausea, vomiting, or diarrhea. 08/02/2021 upon evaluation today patient appears to be doing well with regard to his  feet. Both are showing signs of being drier the right pretty much has not  really draining much at all which is great news. The left is not draining anywhere close to his much as it was during the last evaluation. This is excellent news and overall very pleased. 08/09/2021 upon evaluation today patient appears to be doing well with regard to his legs the right leg especially showing signs of excellent improvement which is great news I do not see any evidence of active infection locally or systemically which is great. In regard to the left leg he still has some weeping and drainage but nothing as significant as what it was in the past this is great news. 08-16-2021 upon evaluation today patient appears to actually be doing quite well in my opinion in regard to his feet. This is significantly improved compared to what we previously seen and overall I am extremely pleased in that regard. I do believe that He is actually improving although this is obviously very slow going. 08-23-2021 upon evaluation today patient appears to be doing well currently in regard to his right leg which actually is pretty dry at this point today. Fortunately I do not see any evidence of active infection at this time which is great news. No fevers, chills, nausea, vomiting, or diarrhea. 08-30-2021 upon evaluation today patient appears to be doing well with regard to his lower extremities. The right foot is pretty much completely dry which is great news the left foot though not completely dry seems to be doing decently well. I do not see any signs of active infection locally or systemically which is great news. No fevers, chills, nausea, vomiting, or diarrhea. 09-06-2021 upon evaluation today patient appears to be doing well with regard to his feet in fact now the left foot is almost completely dry as well and I am definitely seeing a lot of significant improvement. With that being said unfortunately he actually appears to have some cellulitis of his right thigh. His toes are also little bit red but this may  just be due to the increased swelling. He really is not warm to touch in regard to the toes. 5/8; excellent edema control on the right foot and lower leg there is no open wounds but we continue to put compression on this otherwise this will breakdown. He is using his compression pumps twice a day at home The area that is problematic is on the left dorsal foot some areas that are not fully epithelialized with very dry fissured skin over this area. 09-20-2021 upon evaluation today patient appears to be doing a little bit worse in regard to swelling at this time. Fortunately I do not see any signs of infection with that being said the wrap was not on quite as well as what I would like to have seen. I do believe that this has caused a little bit of excess swelling and again we need to try to get this under good control. 09-27-2021 upon evaluation today patient appears to be doing awesome in regard to his feet and legs. Everything is measuring smaller the swelling is down and to be perfectly honest I am extremely pleased with the end of his feet especially on the left side and how dry this is today. I do think we are on the right track here. 5/31; patient presents for follow-up. He is using nystatin powder to the feet bilaterally under 4-layer compression. He has no issues or complaints today. He states he is going  to the lymphedema clinic tomorrow. Since he has no open wounds on the right lower extremity they will be focused on the side. 10-11-2021 upon evaluation today patient appears to be doing excellent in regard to his legs. He has been tolerating the dressing changes without complication. Fortunately there does not appear to be any evidence of active infection locally or systemically at this time which is great news. No fevers, chills, nausea, vomiting, or diarrhea. 10-18-2021 upon evaluation today patient appears to be doing well with regard to his legs. He has been tolerating the dressing changes  without complication and actually seen in lymphedema clinic now in regard to his right leg were taken care of the left leg. Fortunately I do not see any evidence of active infection locally or systemically at this time. 10-26-2021 upon evaluation today patient's wounds actually are showing signs of doing well in fact he really does not have wounds as much is weeping of the lower extremities. Fortunately I do not see any evidence of active infection locally or systemically which is great news. No fever or chills noted 11-02-2021 upon evaluation today patient appears to be doing decently well in regard to his legs. Of actually been on the phone quite a bit discussing with [facility] orthotics custom compression for him. I also have been discussing everything with his lymphedema clinic provider as well. Helene Kelp notes that there is really not much more that she can do for him which was noted last week as well. Subsequently I do believe that the patient would benefit from getting custom compression in fact I think that is what he is going require to keep anything under control here. He voiced understanding. 11-08-2021 upon evaluation today patient appears to be doing excellent in regard to his legs and feet. Fortunately I do not see any signs of infection at this time which is great news. No fever or chills noted again I am still been working on trying to figure out what exactly we need to do as far as getting the [place] involved in coverage for custom compression for this patient. I also discussed with him that he does need to have [place], [name] ([id number]) which I completely understand. With that being said it does make it difficult thinking about what kind of combination of shoes and compression is going to be best for him for the long-term. Fortunately I do not see any evidence of active infection locally or systemically which is great news. Overall I think you are doing quite well. 11-16-2021 upon  evaluation today patient appears to be doing well currently in regard to his legs and feet. Everything is significantly smaller compared to what has been. Fortunately I do not see any evidence of active infection locally or systemically at this time which is great news. 11-23-2021 upon evaluation today patient appears to be doing well with regard to his legs bilaterally. We can actually initiate treatment with a recommendation today to utilize Tubigrip and try to see if we can get this under better control. The patient is in agreement with the plan. Nonetheless the big question is whether this will be something that he can utilize at home in order to keep his swelling down and allow him to be able to function without having to come into the clinic as frequently. He notes that he would love to get to this point. He does have a cousin who is willing to help him with change out the Tubigrip when necessary. 7/25; the  patient does not have any open wounds however his left leg is a lot more swollen than it was last week. His cousin came in who presumably would be the one to change his juxta lites to practice on doing this for the patient. She apparently lives in the [place] block. In the meantime he has been using Tubigrip 12-07-2021 upon evaluation today patient appears to be doing well with regard to his legs although we are transitioning to try to get him to use the Tubigrip which again has been a little bit of a setback but not too much overall I think he is still doing well he still able to wear his current shoes which is good news. He does have a cousin who is willing to help him with the Tubigrip and we are training her how to do this as well. She is here today. 12-14-2021 upon evaluation today patient appears to be doing well currently in regard to his right leg which I think is doing excellent with the Tubigrip. With regard to the left leg unfortunately this is not doing nearly as well and will get me to  do something to intervene here. I think getting back into a 4-layer wraps can be the way to go short-term To get this back under control and the patient voiced understanding. 12-21-2021 upon evaluation today patient appears to be tolerating the dressing changes currently without complication the wrap of the left leg is doing well the right leg reason Tubigrip. The wound is looking significantly improved. 12-28-2021 upon evaluation today patient appears to be doing well currently in regard to his wound. He has been tolerating the dressing changes without complication. Fortunately there is no evidence of active infection locally or systemically at this time. I think he is very close to complete resolution on the left leg and I think we are ready to go back to the Tubigrip. 01-04-2022 upon evaluation today patient appears to be doing okay in regard to his legs of the more swollen because he was rolling and pushing the Tubigrip down because it felt "too tight". With that being said I discussed with the patient today that we really do need to keep this pulled up and if he keeps it pulled up and when it starts to feel tight gets to where he can elevate his legs I think that he can keep this under control. I would suggest if it starts feeling tight that he lay down where he can elevate his legs above heart level to basically help the swelling to go down is much as possible. He voiced understanding. I also told him that is can to be his job to keep these pulled up and that if he is not able to do this I Georgina Peer probably have to refer him elsewhere to get a second opinion as I have pretty much done everything I can try to get his legs under control and keep them under control he is much better than where he used to be and his feet can actually fit in his shoes which is great but in general I just do not know what else to do he has his cousin helping with putting the Tubigrip's on which is awesome and a great  resource for him but again she cannot be there to keep them pulled up and nor is that even her job. 01-11-2022 upon evaluation today patient's legs are looking much better. He actually kept the Tubigrip up this week which has made a  tremendous difference and improvement in his overall appearance today. Fortunately I do not see any signs of active infection locally or systemically at this time. Objective Constitutional Obese and well-hydrated in no acute distress. Vitals Time Taken: 11:00 AM, Weight: 312 lbs, Temperature: 97.9 F, Pulse: 76 bpm, Respiratory Rate: 18 breaths/min, Blood Pressure: 143/71 mmHg. Respiratory normal breathing without difficulty. Psychiatric this patient is able to make decisions and demonstrates good insight into disease process. Alert and Oriented x 3. pleasant and cooperative. General Notes: Upon inspection patient's wound bed actually showed signs of good granulation and epithelization at this point. Fortunately I do not see again any evidence of worsening in general and I think the patient is actually making great progress. No fevers, chills, nausea, vomiting, or diarrhea. 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 Nurse Visit as needed Bathing/ Shower/ Hygiene: May shower with wound dressing protected with water repellent cover or cast protector. No tub bath. Anesthetic (Use 'Patient Medications' Section for Anesthetic Order Entry): Lidocaine applied to wound bed Edema  Control - Lymphedema / Segmental Compressive Device / Other: 4 Layer Compression System Lymphedema. - left side with nystatin between toes and silver cell to any weeping areas with tubi c double layer to ankle/foot Tubigrip double layer applied - Nystatin powder between toes double size d bilat just behind toes to patella knotch , double size C foot ankles enclose toes to just above ankle scell to any weeping areas Elevate, Exercise Daily and Avoid Standing for Long Periods of Time. Elevate legs to the level of the heart and pump ankles as often as possible Elevate leg(s) parallel to the floor when sitting. Compression Pump: Use compression pump on left lower extremity for 60 minutes, twice daily. - 2 times per day DO YOUR BEST to sleep in the bed at night. DO NOT sleep in your recliner. Long hours of sitting in a recliner leads to swelling of the legs and/or potential wounds on your backside. Other: - Contact prescriber regarding use of diuretics to reduce fluid overload. Off-Loading: Turn and reposition every 2 hours Additional Orders / Instructions: Follow Nutritious Diet and Increase Protein Intake Medications-Please add to medication list.: P.O. Antibiotics 1. I would recommend that we go ahead and continue with the Tubigrip which seems to be doing well and the patient is in agreement with plan he states he sees what having about elevating his legs has been doing over the past week and he feels much better and his legs are looking much better. 2. Also can recommend that we have the patient continue to monitor for any signs of worsening or infection. Obviously if anything changes he knows contact the office and let me know. We will see patient back for reevaluation in 1 week here in the clinic. If anything worsens or changes patient will contact our office for additional recommendations. Electronic Signature(s) Signed: 01/11/2022 1:20:06 PM By: Worthy Keeler PA-C Entered By: Worthy Keeler on 01/11/2022 13:20:05 [name] ([id number]) -------------------------------------------------------------------------------- SuperBill Details Patient Name: [name] Date of Service: 01/11/2022 Medical Record Number: [id number] Patient Account Number: [id number] Date  of Birth/Sex: 30-Sep-1950 (71 y.o. 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 I10 Essential (primary) hypertension L97.811 Non-pressure chronic ulcer of other part of right lower leg limited to breakdown of skin Facility Procedures CPT4 Code: [id number] Description: [phone] - WOUND CARE VISIT-LEV 2 EST PT Modifier: Quantity: 1 Physician Procedures CPT4 Code: [id number] Description: 99213 - WC PHYS LEVEL 3 - EST PT Modifier: Quantity: 1 CPT4 Code: Description: ICD-10 Diagnosis Description E11.622 Type 2 diabetes mellitus with other skin ulcer I89.0 Lymphedema, not elsewhere classified I87.2 Venous insufficiency (chronic) (peripheral) L97.822 Non-pressure chronic ulcer of other part of left lower  leg with fat layer Modifier: exposed Quantity: Electronic Signature(s) Signed: 01/11/2022 1:21:22 PM By: Worthy Keeler PA-C Entered By: Worthy Keeler on 01/11/2022 13:21:21

## 2022-01-18 ENCOUNTER — Encounter: Payer: No Typology Code available for payment source | Admitting: Physician Assistant

## 2022-01-18 DIAGNOSIS — E11622 Type 2 diabetes mellitus with other skin ulcer: Secondary | ICD-10-CM | POA: Diagnosis not present

## 2022-01-18 NOTE — Progress Notes (Signed)
[name] ([id number]) Visit Report for 01/18/2022 Chief Complaint Document Details Patient Name: [name] Date of Service: 01/18/2022 11:00 AM Medical Record Number: [id number] Patient Account Number: [id number] Date of Birth/Sex: February 26, 1951 (71 y.o. M) Treating RN: [name] Primary Care Provider: Fontaine No Other Clinician: Referring Provider: Fontaine No Treating Provider/Extender: Skipper Cliche in Treatment: 72 Information Obtained from: Patient Chief Complaint Left LE ulcers Electronic Signature(s) Signed: 01/18/2022 10:59:37 AM By: Worthy Keeler PA-C Entered By: Worthy Keeler on 01/18/2022 10:59:37 [name] ([id number]) -------------------------------------------------------------------------------- HPI Details Patient Name: [name] Date of Service: 01/18/2022 11:00 AM Medical Record Number: [id number] Patient Account Number: [id number] Date of Birth/Sex: 07-14-1950 (71 y.o. M) Treating RN: [name] Primary Care Provider: Fontaine No Other Clinician: Referring Provider: Fontaine No Treating Provider/Extender: Skipper Cliche in Treatment: 83 History of Present Illness HPI Description: 71 year old male who presented to the ER with bilateral lower extremity blisters which had started last week. he has a past medical history of leukemia, diabetes mellitus, hypertension, edema of both lower extremities, his recurrent skin infections, peripheral vascular disease, coronary artery disease, congestive heart failure and peripheral neuropathy. in the ER he was given Rocephin and put on Silvadene cream. he was put on oral doxycycline and was asked to follow-up with the [facility]. His last hemoglobin A1c was 6.6 in December and he checks his blood sugar once a week. He does not have any physicians outside the [place] system. He does not recall any vascular duplex studies done either for arterial or venous disease but was told  to wear compression stockings which he does not use 05/30/2016 -- we have not yet received any of his notes from the [facility] hospital system and his arterial and venous duplex studies are scheduled here in [place] around mid February. We are unable to have his insurance accepted by home health agencies and hence he is getting dressings only once a week. 06/06/16 -- -- I received a call from the patient's PCP at the [facility] and spoke to Dr. Garvin Fila, phone number [phone] and fax number [phone]. She confirmed that no vascular testing was done over the last 5 years and she would be happy to do them if the patient did want them to be done at the [place] and we could fax him a request. Readmission: 71 year old male seen by as in February of this year and was referred to vein and vascular for studies and opinion from the vascular surgeons. The patient returns today with a fresh problem having had blisters on his left lower extremity which have been there for about 5 days and he clearly states that he has been wearing his compression stockings as advised though he could not read the moderate compression and has been wearing light compression. Review of his electronic medical records note that he had lower extremity arterial duplex examination done on 06/23/2016 which showed no hemodynamically significant stenosis in the bilateral lower extremity arterial system. He also had a lower extremity venous reflux examination done on 07/07/2016 and it was noted that he had venous incompetence in the right great saphenous vein and bilateral common femoral veins. Patient was seen by Dr. Tamala Julian on the same day and for some reason his notes do not reflect the venous studies or the arterial studies and he recommended patient do a venous duplex ultrasound to look for reflux and return to see him.he would also consider a lymph pump if required. The patient was told that his workup  was normal and hence the  patient canceled his follow-up appointment. 02/03/17 on evaluation today patient left medial lower extremity blister appears to be doing about the same. It is still continuing to drain and there's still the blistered skin covering the wound bed which is making it difficult for the alternate to do its job. Fortunately there is no evidence of cellulitis. No fevers chills noted. Patient states in general he is not having any significant discomfort. Patient's lower extremity arterial duplex exam revealed that patient was hemodynamically stable with no evidence of stenosis in regard to the bilateral lower extremities. The lower extremity venous reflux exam revealed the patient had venous incontinence noted in the right greater saphenous and bilateral common femoral vein. There is no evidence of deep or superficial vein thrombosis in the bilateral lower extremities. Readmission: 11/12/18 Patient presents for evaluation our clinic today concerning issues that he is having with his left lower extremity. He tells me that a couple weeks ago he began developing blisters on the left lower extremity along with increased swelling. He typically wears his compression stockings on a regular basis is previously been evaluated both here as well is with vascular surgery they would recommend lymphedema pumps but unfortunately that somehow fell through and he never heard anything back from that. Nonetheless I think lymphedema pumps would be beneficial for this patient. He does have a history of hypertension and diabetes. Obviously the chronic venous stasis and lymphedema as well. At this point the blisters have been given in more trouble he states sometimes when the blisters openings able to clean it down with alcohol and it will dry out and do well. Unfortunately that has not been the case this time. He is having some discomfort although this mean these with cleaning the areas he doesn't have discomfort just on a regular  basis. He has not been able to wear his compression stockings since the blisters arose due to the fact that of course it will drain into the socks causing additional issues and he didn't have any way to wrap this otherwise. He has increased to taking his Lasix every day instead of every other day. He sees his primary care provider later this month as well. No fevers, chills, nausea, or vomiting noted at this time. 11/19/18-Patient returns at 1 week, per intake RN the amount of seepage into the compression wraps was definitely improved, overall all the wounds are measuring smaller but continuing silver alginate to the wounds as primary dressing 11/26/18 on evaluation today patient appears to be doing quite well in regard to his left lower Trinity ulcers. In fact of the areas that were noted initially he only has two regions still open. There is no evidence of active infection at this time. He still is not heard anything from the company regarding lymphedema pumps as of yet. Again as previously seen vascular they have not recommended any surgical intervention. 12/03/2018 on evaluation today patient actually appears to be doing quite well with regard to his lower extremity ulcers. In fact most of the areas appear to be healed the one spot which does not seem to be completely healed I am unsure of whether or not this is really draining that much but nonetheless there does not appear to be any signs of infection or significant drainage at this point. There is no sign of fever, chills, nausea, vomiting, or diarrhea. Overall I am pleased with how things have progressed I think is very close to being able to transition  to his home compression stockings. [name], [name] ([id number]) 12/10/2018 upon evaluation today patient appears to be doing quite well with regard to his left lower extremity. He has been tolerating the dressing changes without complication. Fortunately there is no signs of active infection at this  time. He appears after thorough evaluation of his leg to only have 1 small area that remains open at this point everything else appears to be almost completely closed. He still have significant swelling of the left lower extremity. We had discussed discussing this with his primary care provider he is not able to see her in person they were at the [facility] and right now the [place] is not seeing patients on site. According to the patient anyway. Subsequently he did speak with her apparently and his primary care provider feels that he may likely have a DVT. With that being said she has not seen his leg she is just going off of his history. Nonetheless that is a concern that the patient now has as well and while I do not feel the DVT is likely we can definitely ensure that that is not the case I will go ahead and see about putting that order in today. Nonetheless otherwise I am in a recommend that we continue with the current wound care measures including the compression therapy most likely. We just need to ensure that his leg is indeed free of any DVTs. 12/17/2018 on evaluation today patient actually appears to be completely healed today. He does have 2 very small areas of blistering although this is not anything too significant at this point which is good news. With that being said I am in agreement with the fact that I think he is completely healed at this point. He does want to get back into his compression stocking. The good news is we have gotten approval from insurance for his lymphedema pumps we received a letter since last saw him last week. The other good news is his study did come back and showed no evidence of a DVT. 12/20/2018 on evaluation today patient presents for follow-up concerning his ongoing issues with his left lower extremity. He was actually discharged last Friday and did fairly well until he states blisters opened this morning. He tells me he has been wearing his compression  stocking although he has a hard time getting this on. There does not appear to be any signs of active infection at this time. No fevers, chills, nausea, vomiting, or diarrhea. 12/27/2018 on evaluation today patient appears to be doing very well with regard to his swelling of the left lower extremity the 4 layer compression wrap seems to have been beneficial for him. Fortunately there is no signs of active infection at this time. Patient has been tolerating the compression wrap without complication and his foot swelling in particular appears to be greatly improved. He does still have a wound on the lateral portion of his left leg I believe this is more of a blister that has now reopened. 01/03/2019 on evaluation today patient actually appears to be doing excellent in regard to his left lower extremity. He did receive his compression pumps and is actually use this 7 times since he was last here in the office. On top of the compression wrap he is now roughly 3 cm better at the calf and 2 cm better at the ankle he also states that his foot seem to go an issue better without even having to use a shoe horn. Obviously I  think this is all evidence that he is doing excellent in this regard. The other good news is he does not appear to have anything open today as far as wounds are concerned. 01/15/2019 on evaluation today patient appears to be doing more poorly yet again with regard to his left lower extremity. He has developed new wounds again after being discharged just recently. Unfortunately this continues to be the case that he will heal and then have subsequent new wounds. The last time I was hopeful that he may not end up coming back too quickly especially since he states he has been using his lymphedema pumps along with wearing his compression. Nonetheless he had a blister on the back of his leg that popped up on the left and this has opened up into an ulceration it is quite painful. 01/22/19 on evaluation  today patient actually appears to be doing well with regard to his wound on the left lower extremity. He's been tolerating the dressing changes without complication including the compression wrap in the wound appears to be significantly smaller today which is great news. Overall very pleased in this regard. 01/29/2019 on evaluation today patient appears to be doing well with regard to his left posterior lower extremity ulcer. He has been tolerating the dressing changes without complication. This is not completely healed but is getting much closer. We did order a Farrow wrap 4000 for him he has received this and has it with him today although I am not sure we are quite ready to start him on that as of yet. We are very close. 02/05/2019 on evaluation today patient actually appears to be doing quite well with regard to his left posterior lower extremity ulcer. He still has a very tiny opening remaining but the fortunate thing is he seems to be healing quite nicely. He also did get his Farrow wrap which I am hoping will help with his edema control as well at home. Fortunately there is no evidence of active infection. 02/12/2019 patient and fortunately appears to be doing poorly in regard to his wounds of the left lower extremity. He was very close to healing therefore we attempted to use his Velcro compression wraps continuing with lymphedema pumps at home. Unfortunately that does not seem to have done very well for him. He tells me that he wore them all the time but again I am not sure why if that is the case that he is having such significant edema. He is still on his fluid pills as well. With that being said there is no obvious sign of infection although I do wonder about the possibility of infection at this time as well. 02/19/2019 unfortunately upon evaluation today patient appears to be doing more poorly with regard to his left lower extremity. He is not showing signs of significant improvement and I  think the biggest issue here is that he does have an infection that appears to likely be Pseudomonas. That is based on the blue-green drainage that were noted at this time. Unfortunately the antibiotic that has been on is not going to take care of this at all. I think they will get a need to switch him to either Levaquin or Cipro and this was discussed with the patient. 02/26/2019 on evaluation today patient's lower extremity on the left appears to be doing significantly better as compared to last evaluation. Fortunately there is no signs of active infection at this time. He has been tolerating the compression wrap without complication in  fact he made it the whole week at this point. He is showing signs of excellent improvement I am very happy in this regard. With that being said he is having some issues with infection we did review the results of his culture which I noted today. He did have a positive finding for Enterobacter as well as Alcaligenes faecalis. Fortunately the Levaquin that I placed him on will work for both which is great news. There is no signs of systemic infection at this point. 10/30; left posterior leg wound in the setting of very significant edema and what looks like chronic venous inflammation. He has compression pumps but does not use them. We have been using 3 layer compression. Silver alginate to the wound as the primary dressing 03/18/2019 on evaluation today patient appears to be doing a little better compared to last time I saw him. He really has not been using his compression pumps he tells me that he is having too much discomfort. He has been keeping his wraps on however. He is only been taking his fluid pills every other day because he states they are not really helping and he has an appointment with his primary care provider at the [facility] tomorrow. Subsequently the wound itself on the left lower extremity does seem to be greatly improved compared to previous. 03/25/2019 on  evaluation today patient appears to be doing better with regard to his wounds on the bilateral lower extremities. The left is doing excellent the right is also doing better although both still do show some signs of open wounds noted at this point unfortunately. Fortunately there is no signs of active infection at this time. The patient also is not really having any significant pain which is good news. Unfortunately there was some confusion with the referral on vascular disease and as far as getting the patient scheduled there can be contacting him later today to do this [name], [name] ([id number]) fortunately we got this straightened out. 04/01/2019 on evaluation today patient appears to be doing no fevers, chills, nausea, vomiting, or diarrhea. Excellent at this time with regard to his lower extremities. There does not appear to be any open wound at this point which is good news. Fortunately is also no signs of active infection at this time. Overall feel like the patient has done excellent with the compression the problem is every time we got him to this point and then subsequently go to using his own compression things just go right back to where they were. I am not sure how to address this we can try to get an appointment with vascular for 2 weeks now they have yet to call him. Obviously this has become frustrating for the patient as well. I think the issue has just been an honest error as far as scheduling is concerned but nonetheless still worn out the point where I am unsure of which direction we should take. 04/08/2019 on evaluation today patient actually appears to be doing well with regard to his lower extremities. There are no open wounds at this time and things seem to be managing quite nicely as far as the overall edema control is concerned. With that being said he does have his compression socks today for [place] to go ahead and reinitiate therapy in that manner at this point. He is going to be  going for shoes to be measured on Wednesday and then coincidentally he will also be seeing vascular on Thursday. Overall I think this is good news and  again I am hopeful that they will be able to do something for him to help prevent ongoing issues with edema control as well. No fevers, chills, nausea, vomiting, or diarrhea. 04/11/2019 on evaluation today patient actually appears to be doing poorly after just being discharged on Monday of this week. He had been experiencing issues with again blisters especially on the left lower extremity. With that being said he was completely healed and appeared to be doing great this past Monday. He then subsequently has new blisters that formed before his appointment with vascular this morning. He was also measured for shoes in the interim. With that being said we may have figured out what exactly is going on and why he continues to have issues like what we are seeing at this point. He takes his compression stockings off at nighttime and then he ends up having to sleep in his chair for 5-6 hours a night. He sleeps with his feet down he cannot really get him up in the recliner and therefore he is sleeping and the worst possible his position with his feet on the floor for that majority of the time. Again as I explained to him that is about one third at minimum at least one fourth of his day that he spending with his feet dangling down on the ground and the worst possible position they could be. I think this may be what is causing the issue. Subsequently I am leaning toward thinking that he may need a hospital bed in order to elevate his legs. We likely can have to coordinate this with his primary care provider at the [facility]. Readmission: 01/26/2021 this is a patient who presents for repeat evaluation here in the clinic although it is actually been couple of years since have seen him in fact it was December 2020 when I last saw him. Subsequently he never really  healed but did end up being lost to follow-up. He tells me has been having issues ongoing with his lower extremities has bilateral lower extremity lymphedema no real significant or definitive open wounds but in general his lymphedema is way out of control. We were never able to refer him to lymphedema clinic simply due to the fact to be honest we were never able to get him completely healed. I do not see anyone with open wounds. The patient does have evidence of type 2 diabetes mellitus, lymphedema, chronic venous insufficiency, and hypertension. That really has not changed since his last evaluation. 02/09/2021 upon evaluation today patient appears to be doing a little better in regard to his legs although he still having a tremendous amount of drainage especially on the left leg. Fortunately there does not appear to be any evidence of active infection. Of note when we looked into this further it appears that the patient did not have any absorptive dressing on it was just the 4-layer compression wrap. Nonetheless this is probably big part of the issue here. 10/10; he comes in today with 3 large areas on the upper right lower leg likely remanence of denuded blistering under his compression wraps. He has no other wounds on the right. On the left he has the denuded area on the left medial foot and ankle and on the left dorsal foot. Massive lymphedema in both feet dorsally. Using Zetuvit under compression We have increased home health visitation to twice a week to change the dressings and will change it once 02/22/2021 upon evaluation today patient appears to be doing well currently  with regard to his wounds. He has been tolerating the dressing changes without complication. Fortunately there does not appear to be any evidence of active infection which is great news. No fevers, chills, nausea, vomiting, or diarrhea. The biggest issue I see currently is that home health is not putting any medicine on the  actual wounds before wrapping. 03/01/2021 upon evaluation today the patient's right leg actually appears to be doing quite well which is great news there does not appear to be any evidence of active infection at this time. No fevers, chills, nausea, vomiting, or diarrhea. With that being said the patient is having issues on the left foot where he is having significant drainage is also an ammonia smell he does not have any animals at home and this makes me concerned about a bacteria producing urea as a byproduct. Again the possible common organisms will be E. coli, Proteus, and Enterococcus. All 3 of which can be successfully treated with Levaquin. For that reason I think that this may be a good option for [place] to consider placing him on and I did obtain a culture as well for confirmation sake. 03/08/2021 upon evaluation today patient appears to be doing unfortunately still somewhat poorly in regard to his leg ulcerations. He actually has an area on the right leg where he blistered due to the fact that his wrap slid down and caused an area of pinching on his skin and this has led to a significant issue here. 03/15/2021 upon evaluation today patient unfortunately has not been wrapped appropriately with absorptive dressings nor with the appropriate technique for the third layer of the 4-layer compression wrap. These are issues that we continue to try to address with the home health nurse. Also the absorptive dressing that she had was cut in half and therefore that causes things to leak out it does not actually trap the fluid in regard to the top of the foot overall I think that all these combined are really not seeing things improved significantly here. Fortunately there does not appear to be any signs of significant infection at this time which is good news. He still is having a tremendous amount of drainage. 03/22/2021 upon evaluation today patient appears to be draining tremendously. He still continues  to tell me that he is using his pumps 2 times a day and that coupled with that tells me that he is elevating his legs as well. With that being said all things considered I am really just not seeing the improvement we would expect to see with the 4-layer compression wrap and all the above noted. He in fact had an extremely large Zetuvit dressing on both legs and that they were extremely filled to the max with fluid. This is after just being changed just before the weekend and this is Monday. Nonetheless I am concerned about the fact that there is something going on fairly significant that we cannot get any of this under control and that he is draining this significantly. He supposed be having an echocardiogram it sounds like scheduling has been an issue for him as far as getting in sooner. Its something to do with needing his cousin to drive him because of where it sat and he cannot drive himself to this appointment either way I really think he needs to try to see what he can do about making this happen a little sooner. He tells me he will call today. [name], [name] ([id number]) 03/29/2021 on evaluation today patient appears to be  doing about the same in regard to his legs. He did get his cardiology appointment moved up to 6 December which is at least good that is better than what it was before mid December. Overall very pleased in that regard. 04/05/2021 upon evaluation today patient unfortunately is still doing fairly poorly. There does not appear to be any signs of active infection at this time. No fevers, chills, nausea, vomiting, or diarrhea. Unfortunately I think until his edema is under control and overall fluid overload there is really not to be much chance that I can do much to get him better. This is quite unfortunate and frustrating both for myself and the patient to be perfectly honest. Nonetheless I think that he really needs to have a conversation both with his primary care provider as well  as cardiologist he sees the PCP on Monday and cardiology on Wednesday of next week. 04/13/2021 upon evaluation today patient appears to be doing poorly in regard to his bilateral lower extremities his left is still worse than the right. With that being said he has a tremendous amount of drainage he did see his primary care provider yesterday there really was not much there to be done from their perspective. He sees cardiology tomorrow. Nonetheless my biggest concern here is simply that if we do not get the edema under control he is going to continue to have drainage and honestly I think at some point he is going to become infected severely that is my main concern. 04/19/2021 upon evaluation today patient appears to be doing poorly still in regard to his legs. Unfortunately there does not appear to be any signs of infection at this point. He does have a tremendous amount of drainage however. We have not seen the results back from the cardiologist and the echocardiogram that was done. It appears that the patient checked out okay as far as that is concerned with regard to ejection fraction though we still have some issues here to be honest with his diastolic function. I am unsure if this is accounting for everything that we are seeing or not. Either way he has a tremendous amount of drainage from his legs that we are just not able to control in the outpatient setting at this point. I have reached out to Dr. Rockey Situ his cardiologist to see once he reviews the sheet if there is anything that he feels like can be done from an outpatient perspective if not then I think the way to go is probably can to be through inpatient admission and diuresis. Otherwise I am not sure how working to get this under control we tried antibiotics, compression wrapping, and I have told the patient to be elevating his legs I am not sure how much he does of this but either way I think that this is still an ongoing issue  nonetheless. 04/26/2021 upon evaluation today patient appears to be doing poorly in regard to his legs. He is having a tremendous amount of fluid at this point which is quite unfortunate. Its to the point that he may have had at least 5 to 10 pounds of fluid in his dressings this morning when they were removed these were changed this Friday. Subsequently I think he needs to go to the ER for further evaluation and treatment I think is probably can need diuresis possibly even IV antibiotics been on what the blood work looks like but in general I feel like he needs something to get this under control from  an outpatient perspective absent of everywhere I can think of and I cannot get this under control with our traditional measures. I think this is going require more so that we can get him better 12/30; this is a patient with severe bilateral lymphedema. He was hospitalized from 04/26/2021 through 04/29/2021 treated for cellulitis in the setting of lower extremity ulcers and lymphedema. After he left the hospital he is apparently seen for nurse visit our staff contacted cardiology and he has been started on Lasix 40 mg. Apparently his legs have less edema. Lab work from 05/04/2021 showed a BUN of 38 and creatinine of 1.59 these are elevated versus previous where his creatinine seems to have been 1.30 on 12/19 his potassium is 4.3. I believe the lab work is being followed by cardiology We have him in a 4-layer wrap. Xeroform on the leg wounds and sit to fit on the Berry damage skin on the left dorsal foot versus right dorsal foot. He has compression pumps but does not use them. We have apparently not yet ordered him compression stockings 05/17/2021 upon evaluation today patient's legs though better than last time I personally saw him appear to be getting worse compared to where they were previous. Dr. Quentin Cornwall was actually last 1 to see you I have not seen him since 19 December. That was before he went into  the ER. Coming out apparently his legs looked also and they still look better but not as good as they were in the past. 1/16; patient with severe bilateral lymphedema. Severe scaled hyperkeratotic skin on the dorsal aspect of his distal left foot and left medial ankle.. On the right side changes are not as bad. He did not have any weeping edema. Our intake nurse was convinced that he is being compliant with compression pumps 1 hour twice a day 05/31/2021 upon evaluation today patient actually appears to be doing a little bit better in my opinion in regard to his feet. I do not see as much drainage and it being just completely wet as it was previous. Fortunately I do not also see any signs of active infection which is great news as well. 06/07/21 Upon inspection patient's wound bed actually showed signs of doing well he is not nearly as weepy and wet as he has been in the past and overall very pleased in that regard. Fortunately I do not see any signs of active infection locally or nor systemically at this time. Which is great news. No fevers, chills, nausea, vomiting, or diarrhea. 06/14/2021 upon evaluation today patient appears to be doing well with regard to his right foot I am pleased in that regard. His left foot is still draining quite a bit despite using lymphedema pumps, 4-layer compression wraps, and he tells me elevating his legs as well. He also has Lasix that he takes twice a day. Nonetheless I believe that this is still good to be an ongoing issue. We have a hard time getting this under control as far as the swelling is concerned. 06/21/2021 upon evaluation today patient appears to be doing decently well in regard to his wounds all things considered. He still has a tremendous amount of drainage and fluid noted at this point. Fortunately I do not see any signs of active infection locally or systemically at this point which is great news. Nonetheless I am unsure where to go and how to do this  as far as trying to limit his swelling and weeping from his toes in particular.  06/28/2021 upon evaluation patient unfortunately continues to have significant drainage from his feet. We have been keeping him in a compression wrap and despite this he still continues to have extreme fluid issues he seen his cardiologist he is seeing the nephrologist. We really cannot find any way to get this under control when he did well was when he was in the hospital and they got some of the fluid away. But outside of that we are just struggling to achieve the long-term goal of getting this under control and keeping it under control unfortunately. 07/05/2021 upon evaluation today patient appears to be doing poorly in regard to his feet. Unfortunately this continues to be a significant issue and to be honest I am really not certain what to do about it. I referred him to Dr. Randol Kern at [facility] and he does have an appointment although it is 12 April. He also sees his primary care provider on 6 April. He did not want to see Dr. Haynes Kerns until after he saw his PCP that is the reason the appointment so far out. There is really not much I can do in that regard. Nonetheless I do think that we are still continue to have significant lymphedema issues with significant mount of weeping in regard to the feet and again this has just become extremely difficult to manage to be honest I am not sure if there is something else that Dr. Haynes Kerns or someone else could recommend he also will be seeing Dr. Dellia Nims in 2 weeks when I am on vacation and at that time I will see if Dr. Dellia Nims has any ideas about where to go from here in the meantime. [name], [name] ([id number]) 07/12/2021 upon evaluation today patient appears to be doing about as well as can be expected with regard to his feet. He does actually see his kidney doctor this Friday. He also will be seeing his primary care provider on April 4 and then following that around mid April he will be  seeing Dr. Haynes Kerns at [facility] which was a referral made for him. Again my goal is to try to find out some way to fix this and to be perfectly honest we have had some issues with making any good adjustments. When he was in the hospital and greater amounts of Lasix he was able to get this down and it looked much better upon discharge. With that being said right now things just are not doing nearly as good as what they used to be. 3/13; patient presents for follow-up. He has been using his lymphedema pumps over the past week. He reports an increase in his Lasix dose. He has no issues or complaints today. He denies signs of infection. 07/26/2021 upon evaluation today patient appears to be doing better in regard to his feet bilaterally. Both are showing signs of much less drainage which is great news and overall very pleased in that regard. Fortunately there does not appear to be any evidence of active infection locally or systemically at this time. No fevers, chills, nausea, vomiting, or diarrhea. 08/02/2021 upon evaluation today patient appears to be doing well with regard to his feet. Both are showing signs of being drier the right pretty much has not really draining much at all which is great news. The left is not draining anywhere close to his much as it was during the last evaluation. This is excellent news and overall very pleased. 08/09/2021 upon evaluation today patient appears to be doing well with regard to  his legs the right leg especially showing signs of excellent improvement which is great news I do not see any evidence of active infection locally or systemically which is great. In regard to the left leg he still has some weeping and drainage but nothing as significant as what it was in the past this is great news. 08-16-2021 upon evaluation today patient appears to actually be doing quite well in my opinion in regard to his feet. This is significantly improved compared to what we previously seen  and overall I am extremely pleased in that regard. I do believe that He is actually improving although this is obviously very slow going. 08-23-2021 upon evaluation today patient appears to be doing well currently in regard to his right leg which actually is pretty dry at this point today. Fortunately I do not see any evidence of active infection at this time which is great news. No fevers, chills, nausea, vomiting, or diarrhea. 08-30-2021 upon evaluation today patient appears to be doing well with regard to his lower extremities. The right foot is pretty much completely dry which is great news the left foot though not completely dry seems to be doing decently well. I do not see any signs of active infection locally or systemically which is great news. No fevers, chills, nausea, vomiting, or diarrhea. 09-06-2021 upon evaluation today patient appears to be doing well with regard to his feet in fact now the left foot is almost completely dry as well and I am definitely seeing a lot of significant improvement. With that being said unfortunately he actually appears to have some cellulitis of his right thigh. His toes are also little bit red but this may just be due to the increased swelling. He really is not warm to touch in regard to the toes. 5/8; excellent edema control on the right foot and lower leg there is no open wounds but we continue to put compression on this otherwise this will breakdown. He is using his compression pumps twice a day at home The area that is problematic is on the left dorsal foot some areas that are not fully epithelialized with very dry fissured skin over this area. 09-20-2021 upon evaluation today patient appears to be doing a little bit worse in regard to swelling at this time. Fortunately I do not see any signs of infection with that being said the wrap was not on quite as well as what I would like to have seen. I do believe that this has caused a little bit of excess swelling  and again we need to try to get this under good control. 09-27-2021 upon evaluation today patient appears to be doing awesome in regard to his feet and legs. Everything is measuring smaller the swelling is down and to be perfectly honest I am extremely pleased with the end of his feet especially on the left side and how dry this is today. I do think we are on the right track here. 5/31; patient presents for follow-up. He is using nystatin powder to the feet bilaterally under 4-layer compression. He has no issues or complaints today. He states he is going to the lymphedema clinic tomorrow. Since he has no open wounds on the right lower extremity they will be focused on the side. 10-11-2021 upon evaluation today patient appears to be doing excellent in regard to his legs. He has been tolerating the dressing changes without complication. Fortunately there does not appear to be any evidence of active infection locally or  systemically at this time which is great news. No fevers, chills, nausea, vomiting, or diarrhea. 10-18-2021 upon evaluation today patient appears to be doing well with regard to his legs. He has been tolerating the dressing changes without complication and actually seen in lymphedema clinic now in regard to his right leg were taken care of the left leg. Fortunately I do not see any evidence of active infection locally or systemically at this time. 10-26-2021 upon evaluation today patient's wounds actually are showing signs of doing well in fact he really does not have wounds as much is weeping of the lower extremities. Fortunately I do not see any evidence of active infection locally or systemically which is great news. No fever or chills noted 11-02-2021 upon evaluation today patient appears to be doing decently well in regard to his legs. Of actually been on the phone quite a bit discussing with [facility] orthotics custom compression for him. I also have been discussing everything with his  lymphedema clinic provider as well. Helene Kelp notes that there is really not much more that she can do for him which was noted last week as well. Subsequently I do believe that the patient would benefit from getting custom compression in fact I think that is what he is going require to keep anything under control here. He voiced understanding. 11-08-2021 upon evaluation today patient appears to be doing excellent in regard to his legs and feet. Fortunately I do not see any signs of infection at this time which is great news. No fever or chills noted again I am still been working on trying to figure out what exactly we need to do as far as getting the [place] involved in coverage for custom compression for this patient. I also discussed with him that he does need to have custom shoes which I completely understand. With that being said it does make it difficult thinking about what kind of combination of shoes and compression is going to be best for him for the long-term. Fortunately I do not see any evidence of active infection locally or systemically which is great news. Overall I think you are doing quite well. 11-16-2021 upon evaluation today patient appears to be doing well currently in regard to his legs and feet. Everything is significantly smaller [place], [name] ([id number]) compared to what has been. Fortunately I do not see any evidence of active infection locally or systemically at this time which is great news. 11-23-2021 upon evaluation today patient appears to be doing well with regard to his legs bilaterally. We can actually initiate treatment with a recommendation today to utilize Tubigrip and try to see if we can get this under better control. The patient is in agreement with the plan. Nonetheless the big question is whether this will be something that he can utilize at home in order to keep his swelling down and allow him to be able to function without having to come into the clinic as frequently.  He notes that he would love to get to this point. He does have a cousin who is willing to help him with change out the Tubigrip when necessary. 7/25; the patient does not have any open wounds however his left leg is a lot more swollen than it was last week. His cousin came in who presumably would be the one to change his juxta lites to practice on doing this for the patient. She apparently lives in the [place] block. In the meantime he has been using Tubigrip 12-07-2021 upon  evaluation today patient appears to be doing well with regard to his legs although we are transitioning to try to get him to use the Tubigrip which again has been a little bit of a setback but not too much overall I think he is still doing well he still able to wear his current shoes which is good news. He does have a cousin who is willing to help him with the Tubigrip and we are training her how to do this as well. She is here today. 12-14-2021 upon evaluation today patient appears to be doing well currently in regard to his right leg which I think is doing excellent with the Tubigrip. With regard to the left leg unfortunately this is not doing nearly as well and will get me to do something to intervene here. I think getting back into a 4-layer wraps can be the way to go short-term To get this back under control and the patient voiced understanding. 12-21-2021 upon evaluation today patient appears to be tolerating the dressing changes currently without complication the wrap of the left leg is doing well the right leg reason Tubigrip. The wound is looking significantly improved. 12-28-2021 upon evaluation today patient appears to be doing well currently in regard to his wound. He has been tolerating the dressing changes without complication. Fortunately there is no evidence of active infection locally or systemically at this time. I think he is very close to complete resolution on the left leg and I think we are ready to go back to the  Tubigrip. 01-04-2022 upon evaluation today patient appears to be doing okay in regard to his legs of the more swollen because he was rolling and pushing the Tubigrip down because it felt "too tight". With that being said I discussed with the patient today that we really do need to keep this pulled up and if he keeps it pulled up and when it starts to feel tight gets to where he can elevate his legs I think that he can keep this under control. I would suggest if it starts feeling tight that he lay down where he can elevate his legs above heart level to basically help the swelling to go down is much as possible. He voiced understanding. I also told him that is can to be his job to keep these pulled up and that if he is not able to do this I Georgina Peer probably have to refer him elsewhere to get a second opinion as I have pretty much done everything I can try to get his legs under control and keep them under control he is much better than where he used to be and his feet can actually fit in his shoes which is great but in general I just do not know what else to do he has his cousin helping with putting the Tubigrip's on which is awesome and a great resource for him but again she cannot be there to keep them pulled up and nor is that even her job. 01-11-2022 upon evaluation today patient's legs are looking much better. He actually kept the Tubigrip up this week which has made a tremendous difference and improvement in his overall appearance today. Fortunately I do not see any signs of active infection locally or systemically at this time. 01-18-2022 upon evaluation today patient appears to be doing well currently in regard to his legs. If active does not appear to be anything open at this point. This is great news. Electronic Signature(s) Signed: 01/18/2022 11:38:57  AM By: Worthy Keeler PA-C Entered By: Worthy Keeler on 01/18/2022 11:38:57 [name]  ([id number]) -------------------------------------------------------------------------------- Problem List Details Patient Name: RAMELO, OETKEN Date of Service: 01/18/2022 11:00 AM Medical Record Number: [id number] Patient Account Number: [id number] Date of Birth/Sex: January 14, 1951 (71 y.o. M) Treating RN: [name] Primary Care Provider: Fontaine No Other Clinician: Referring Provider: Fontaine No Treating Provider/Extender: Skipper Cliche in Treatment: 30 Active Problems ICD-10 Encounter Code Description Active Date MDM Diagnosis E11.622 Type 2 diabetes mellitus with other skin ulcer 01/26/2021 No Yes I89.0 Lymphedema, not elsewhere classified 01/26/2021 No Yes I87.2 Venous insufficiency (chronic) (peripheral) 01/26/2021 No Yes L97.822 Non-pressure chronic ulcer of other part of left lower leg with fat layer 01/26/2021 No Yes exposed L97.512 Non-pressure chronic ulcer of other part of right foot with fat layer 01/26/2021 No Yes exposed [place] (primary) hypertension 01/26/2021 No Yes L97.811 Non-pressure chronic ulcer of other part of right lower leg limited to 02/15/2021 No Yes breakdown of skin Inactive Problems Resolved Problems Electronic Signature(s) Signed: 01/18/2022 10:59:30 AM By: Worthy Keeler PA-C Entered By: Worthy Keeler on 01/18/2022 10:59:30

## 2022-01-18 NOTE — Progress Notes (Signed)
[name] ([id number]) Visit Report for 01/18/2022 Arrival Information Details Patient Name: [name], [name] Date of Service: 01/18/2022 11:00 AM Medical Record Number: [id number] Patient Account Number: [id number] Date of Birth/Sex: 12/01/1950 (71 y.o. M) Treating RN: [name] Primary Care Brodie Scovell: [place] No Other Clinician: Referring Mayo Faulk: [place] No Treating Panhia Karl/Extender: Skipper Cliche in Treatment: 24 Visit Information History Since Last Visit All ordered tests and consults were completed: No Patient Arrived: Ambulatory Added or deleted any medications: No Arrival Time: 11:13 Any new allergies or adverse reactions: No Accompanied By: self Had a fall or experienced change in No Transfer Assistance: None activities of daily living that may affect Patient Identification Verified: Yes risk of falls: Secondary Verification Process Completed: Yes Signs or symptoms of abuse/neglect since last visito No Patient Requires Transmission-Based No Hospitalized since last visit: No Precautions: Implantable device outside of the clinic excluding No Patient Has Alerts: Yes cellular tissue based products placed in the center Patient Alerts: AVVS consult on file since last visit: Last ABI-R 1.09; L Has Dressing in Place as Prescribed: Yes 1.04 Has Compression in Place as Prescribed: Yes Pain Present Now: No Electronic Signature(s) Unsigned Entered ByCarlene Coria on 01/18/2022 11:13:40 Signature(s): Date(s): [name] ([id number]) -------------------------------------------------------------------------------- Lower Extremity Assessment Details Patient Name: [name], [name] Date of Service: 01/18/2022 11:00 AM Medical Record Number: [id number] Patient Account Number: [id number] Date of Birth/Sex: 1950-11-30 (71 y.o. M) Treating RN: [name] Primary Care Claribel Sachs: [place] No Other Clinician: Referring Marcell Chavarin: [place] No Treating Amela Handley/Extender: Skipper Cliche in Treatment: 51 Edema Assessment Assessed: [Left: No] Patrice Paradise: No] [Left: Edema] [Right: :] Calf Left: Right: Point of Measurement: 38 cm From Medial Instep 36 cm 38 cm Ankle Left: Right: Point of Measurement: 14 cm From Medial Instep 24 cm 27 cm Electronic Signature(s) Unsigned Entered ByCarlene Coria on 01/18/2022 11:23:48 Signature(s): Date(s): [name] ([id number]) -------------------------------------------------------------------------------- Multi Wound Chart Details Patient Name: [name], [name] Date of Service: 01/18/2022 11:00 AM Medical Record Number: [id number] Patient Account Number: [id number] Date of Birth/Sex: [birth year] (71 y.o. M) Treating RN: [name] Primary Care Tymeer Vaquera: [place] No Other Clinician: Referring Kamdyn Colborn: [place] No Treating Fleta Borgeson/Extender: Skipper Cliche in Treatment: 51 Vital Signs Height(in): Pulse(bpm): 58 Weight(lbs): 312 Blood Pressure(mmHg): 130/58 Body Mass Index(BMI): Temperature(F): 97.8 Respiratory Rate(breaths/min): 16 Wound Assessments Treatment Notes Electronic Signature(s) Unsigned Entered ByCarlene Coria on 01/18/2022 11:24:08 Signature(s): Date(s): [name] ([id number]) -------------------------------------------------------------------------------- [place] Details Patient Name: [name], [name] Date of Service: 01/18/2022 11:00 AM Medical Record Number: [id number] Patient Account Number: [id number] Date of Birth/Sex: 1951-01-28 (71 y.o. M) Treating RN: [name] Primary Care Huan Pollok: [place] No Other Clinician: Referring Dalila Arca: [place] No Treating Gabe Glace/Extender: Skipper Cliche in Treatment: 30 Active Inactive Soft Tissue Infection Nursing Diagnoses: Impaired tissue integrity Knowledge deficit related to disease process and management Knowledge deficit related to  home infection control: handwashing, handling of soiled dressings, supply storage Potential for infection: soft tissue Goals: Patient/caregiver will verbalize understanding of or measures to prevent infection and contamination in the home setting Date Initiated: 09/06/2021 Date Inactivated: 10/18/2021 Target Resolution Date: 09/06/2021 Goal Status: Met Patient's soft tissue infection will resolve Date Initiated: 09/06/2021 Date Inactivated: 10/18/2021 Target Resolution Date: 09/06/2021 Goal Status: Met Signs and symptoms of infection will be recognized early to allow for prompt treatment Date Initiated: 09/06/2021 Target Resolution Date: 11/28/2021 Goal Status: Active Interventions: Assess signs and symptoms of infection every visit Provide education on infection Treatment Activities: Education provided on Infection : 01/11/2022 Systemic antibiotics : 09/06/2021 Notes:  Electronic Signature(s) Unsigned Entered By: [name] on 01/18/2022 11:23:53 Signature(s): Date(s): [name] ([id number]) -------------------------------------------------------------------------------- Pain Assessment Details Patient Name: [name], [name] Date of Service: 01/18/2022 11:00 AM Medical Record Number: [id number] Patient Account Number: [id number] Date of Birth/Sex: [birth year] (71 y.o. M) Treating RN: [name] Primary Care Esmirna Ravan: [place] No Other Clinician: Referring Nitisha Civello: [place] No Treating Cashae Weich/Extender: Skipper Cliche in Treatment: 81 Active Problems Location of Pain Severity and Description of Pain Patient Has Paino No Site Locations Pain Management and Medication Current Pain Management: Electronic Signature(s) Unsigned Entered ByCarlene Coria on 01/18/2022 11:14:57 Signature(s): Date(s): [name] ([id number]) -------------------------------------------------------------------------------- [name] Details Patient Name: [name] Date of Service:  01/18/2022 11:00 AM Medical Record Number: [id number] Patient Account Number: [id number] Date of Birth/Sex: [birth year] (71 y.o. M) Treating RN: [name] Primary Care Shanterica Biehler: [place] No Other Clinician: Referring Jenita Rayfield: [place] No Treating Tayshon Winker/Extender: Skipper Cliche in Treatment: 51 Vital Signs Time Taken: 11:14 Temperature (F): 97.8 Weight (lbs): 312 Pulse (bpm): 58 Respiratory Rate (breaths/min): 16 Blood Pressure (mmHg): 130/58 Reference Range: 80 - 120 mg / dl Electronic Signature(s) Unsigned Entered ByCarlene Coria on 01/18/2022 11:14:49 Signature(s): Date(s):

## 2022-01-25 ENCOUNTER — Encounter: Payer: No Typology Code available for payment source | Admitting: Physician Assistant

## 2022-01-25 DIAGNOSIS — E11622 Type 2 diabetes mellitus with other skin ulcer: Secondary | ICD-10-CM | POA: Diagnosis not present

## 2022-01-25 NOTE — Progress Notes (Addendum)
APOLLO, TIMOTHY ([id number]) Visit Report for 01/25/2022 Chief Complaint Document Details Patient Name: [name], [name] Date of Service: 01/25/2022 11:00 AM Medical Record Number: [id number] Patient Account Number: [id number] Date of Birth/Sex: 05/27/1950 (71 y.o. M) Treating RN: [name] Primary Care Provider: Fontaine No Other Clinician: Referring Provider: Fontaine No Treating Provider/Extender: Skipper Cliche in Treatment: 41 Information Obtained from: Patient Chief Complaint Left LE ulcers Electronic Signature(s) Signed: 01/25/2022 11:06:12 AM By: Worthy Keeler PA-C Entered By: Worthy Keeler on 01/25/2022 11:06:12 [name] ([id number]) -------------------------------------------------------------------------------- HPI Details Patient Name: [name] Date of Service: 01/25/2022 11:00 AM Medical Record Number: [id number] Patient Account Number: [id number] Date of Birth/Sex: [birth year] (71 y.o. M) Treating RN: [name] Primary Care Provider: Fontaine No Other Clinician: Referring Provider: Fontaine No Treating Provider/Extender: Skipper Cliche in Treatment: 48 History of Present Illness HPI Description: 71 year old male who presented to the ER with bilateral lower extremity blisters which had started last week. he has a past medical history of leukemia, diabetes mellitus, hypertension, edema of both lower extremities, his recurrent skin infections, peripheral vascular disease, coronary artery disease, congestive heart failure and peripheral neuropathy. in the ER he was given Rocephin and put on Silvadene cream. he was put on oral doxycycline and was asked to follow-up with the [facility]. His last hemoglobin A1c was 6.6 in December and he checks his blood sugar once a week. He does not have any physicians outside the [place] system. He does not recall any vascular duplex studies done either for arterial or venous disease but was told  to wear compression stockings which he does not use 05/30/2016 -- we have not yet received any of his notes from the [facility] hospital system and his arterial and venous duplex studies are scheduled here in [place] around mid February. We are unable to have his insurance accepted by home health agencies and hence he is getting dressings only once a week. 06/06/16 -- -- I received a call from the patient's PCP at the [facility] at [facility] and spoke to Dr. Garvin Fila, phone number [phone] and fax number [phone]. She confirmed that no vascular testing was done over the last 5 years and she would be happy to do them if the patient did want them to be done at the [place] and we could fax him a request. Readmission: 71 year old male seen by as in February of this year and was referred to vein and vascular for studies and opinion from the vascular surgeons. The patient returns today with a fresh problem having had blisters on his left lower extremity which have been there for about 5 days and he clearly states that he has been wearing his compression stockings as advised though he could not read the moderate compression and has been wearing light compression. Review of his electronic medical records note that he had lower extremity arterial duplex examination done on 06/23/2016 which showed no hemodynamically significant stenosis in the bilateral lower extremity arterial system. He also had a lower extremity venous reflux examination done on 07/07/2016 and it was noted that he had venous incompetence in the right great saphenous vein and bilateral common femoral veins. Patient was seen by Dr. Tamala Julian on the same day and for some reason his notes do not reflect the venous studies or the arterial studies and he recommended patient do a venous duplex ultrasound to look for reflux and return to see him.he would also consider a lymph pump if required. The patient was told that his workup  was normal and hence the  patient canceled his follow-up appointment. 02/03/17 on evaluation today patient left medial lower extremity blister appears to be doing about the same. It is still continuing to drain and there's still the blistered skin covering the wound bed which is making it difficult for the alternate to do its job. Fortunately there is no evidence of cellulitis. No fevers chills noted. Patient states in general he is not having any significant discomfort. Patient's lower extremity arterial duplex exam revealed that patient was hemodynamically stable with no evidence of stenosis in regard to the bilateral lower extremities. The lower extremity venous reflux exam revealed the patient had venous incontinence noted in the right greater saphenous and bilateral common femoral vein. There is no evidence of deep or superficial vein thrombosis in the bilateral lower extremities. Readmission: 11/12/18 Patient presents for evaluation our clinic today concerning issues that he is having with his left lower extremity. He tells me that a couple weeks ago he began developing blisters on the left lower extremity along with increased swelling. He typically wears his compression stockings on a regular basis is previously been evaluated both here as well is with vascular surgery they would recommend lymphedema pumps but unfortunately that somehow fell through and he never heard anything back from that. Nonetheless I think lymphedema pumps would be beneficial for this patient. He does have a history of hypertension and diabetes. Obviously the chronic venous stasis and lymphedema as well. At this point the blisters have been given in more trouble he states sometimes when the blisters openings able to clean it down with alcohol and it will dry out and do well. Unfortunately that has not been the case this time. He is having some discomfort although this mean these with cleaning the areas he doesn't have discomfort just on a regular  basis. He has not been able to wear his compression stockings since the blisters arose due to the fact that of course it will drain into the socks causing additional issues and he didn't have any way to wrap this otherwise. He has increased to taking his Lasix every day instead of every other day. He sees his primary care provider later this month as well. No fevers, chills, nausea, or vomiting noted at this time. 11/19/18-Patient returns at 1 week, per intake RN the amount of seepage into the compression wraps was definitely improved, overall all the wounds are measuring smaller but continuing silver alginate to the wounds as primary dressing 11/26/18 on evaluation today patient appears to be doing quite well in regard to his left lower Trinity ulcers. In fact of the areas that were noted initially he only has two regions still open. There is no evidence of active infection at this time. He still is not heard anything from the company regarding lymphedema pumps as of yet. Again as previously seen vascular they have not recommended any surgical intervention. 12/03/2018 on evaluation today patient actually appears to be doing quite well with regard to his lower extremity ulcers. In fact most of the areas appear to be healed the one spot which does not seem to be completely healed I am unsure of whether or not this is really draining that much but nonetheless there does not appear to be any signs of infection or significant drainage at this point. There is no sign of fever, chills, nausea, vomiting, or diarrhea. Overall I am pleased with how things have progressed I think is very close to being able to transition  to his home compression stockings. [name], [name] ([id number]) 12/10/2018 upon evaluation today patient appears to be doing quite well with regard to his left lower extremity. He has been tolerating the dressing changes without complication. Fortunately there is no signs of active infection at this  time. He appears after thorough evaluation of his leg to only have 1 small area that remains open at this point everything else appears to be almost completely closed. He still have significant swelling of the left lower extremity. We had discussed discussing this with his primary care provider he is not able to see her in person they were at the [facility] and right now the [place] is not seeing patients on site. According to the patient anyway. Subsequently he did speak with her apparently and his primary care provider feels that he may likely have a DVT. With that being said she has not seen his leg she is just going off of his history. Nonetheless that is a concern that the patient now has as well and while I do not feel the DVT is likely we can definitely ensure that that is not the case I will go ahead and see about putting that order in today. Nonetheless otherwise I am in a recommend that we continue with the current wound care measures including the compression therapy most likely. We just need to ensure that his leg is indeed free of any DVTs. 12/17/2018 on evaluation today patient actually appears to be completely healed today. He does have 2 very small areas of blistering although this is not anything too significant at this point which is good news. With that being said I am in agreement with the fact that I think he is completely healed at this point. He does want to get back into his compression stocking. The good news is we have gotten approval from insurance for his lymphedema pumps we received a letter since last saw him last week. The other good news is his study did come back and showed no evidence of a DVT. 12/20/2018 on evaluation today patient presents for follow-up concerning his ongoing issues with his left lower extremity. He was actually discharged last Friday and did fairly well until he states blisters opened this morning. He tells me he has been wearing his compression  stocking although he has a hard time getting this on. There does not appear to be any signs of active infection at this time. No fevers, chills, nausea, vomiting, or diarrhea. 12/27/2018 on evaluation today patient appears to be doing very well with regard to his swelling of the left lower extremity the 4 layer compression wrap seems to have been beneficial for him. Fortunately there is no signs of active infection at this time. Patient has been tolerating the compression wrap without complication and his foot swelling in particular appears to be greatly improved. He does still have a wound on the lateral portion of his left leg I believe this is more of a blister that has now reopened. 01/03/2019 on evaluation today patient actually appears to be doing excellent in regard to his left lower extremity. He did receive his compression pumps and is actually use this 7 times since he was last here in the office. On top of the compression wrap he is now roughly 3 cm better at the calf and 2 cm better at the ankle he also states that his foot seem to go an issue better without even having to use a shoe horn. Obviously I  think this is all evidence that he is doing excellent in this regard. The other good news is he does not appear to have anything open today as far as wounds are concerned. 01/15/2019 on evaluation today patient appears to be doing more poorly yet again with regard to his left lower extremity. He has developed new wounds again after being discharged just recently. Unfortunately this continues to be the case that he will heal and then have subsequent new wounds. The last time I was hopeful that he may not end up coming back too quickly especially since he states he has been using his lymphedema pumps along with wearing his compression. Nonetheless he had a blister on the back of his leg that popped up on the left and this has opened up into an ulceration it is quite painful. 01/22/19 on evaluation  today patient actually appears to be doing well with regard to his wound on the left lower extremity. He's been tolerating the dressing changes without complication including the compression wrap in the wound appears to be significantly smaller today which is great news. Overall very pleased in this regard. 01/29/2019 on evaluation today patient appears to be doing well with regard to his left posterior lower extremity ulcer. He has been tolerating the dressing changes without complication. This is not completely healed but is getting much closer. We did order a Farrow wrap 4000 for him he has received this and has it with him today although I am not sure we are quite ready to start him on that as of yet. We are very close. 02/05/2019 on evaluation today patient actually appears to be doing quite well with regard to his left posterior lower extremity ulcer. He still has a very tiny opening remaining but the fortunate thing is he seems to be healing quite nicely. He also did get his Farrow wrap which I am hoping will help with his edema control as well at home. Fortunately there is no evidence of active infection. 02/12/2019 patient and fortunately appears to be doing poorly in regard to his wounds of the left lower extremity. He was very close to healing therefore we attempted to use his Velcro compression wraps continuing with lymphedema pumps at home. Unfortunately that does not seem to have done very well for him. He tells me that he wore them all the time but again I am not sure why if that is the case that he is having such significant edema. He is still on his fluid pills as well. With that being said there is no obvious sign of infection although I do wonder about the possibility of infection at this time as well. 02/19/2019 unfortunately upon evaluation today patient appears to be doing more poorly with regard to his left lower extremity. He is not showing signs of significant improvement and I  think the biggest issue here is that he does have an infection that appears to likely be Pseudomonas. That is based on the blue-green drainage that were noted at this time. Unfortunately the antibiotic that has been on is not going to take care of this at all. I think they will get a need to switch him to either Levaquin or Cipro and this was discussed with the patient. 02/26/2019 on evaluation today patient's lower extremity on the left appears to be doing significantly better as compared to last evaluation. Fortunately there is no signs of active infection at this time. He has been tolerating the compression wrap without complication in  fact he made it the whole week at this point. He is showing signs of excellent improvement I am very happy in this regard. With that being said he is having some issues with infection we did review the results of his culture which I noted today. He did have a positive finding for Enterobacter as well as Alcaligenes faecalis. Fortunately the Levaquin that I placed him on will work for both which is great news. There is no signs of systemic infection at this point. 10/30; left posterior leg wound in the setting of very significant edema and what looks like chronic venous inflammation. He has compression pumps but does not use them. We have been using 3 layer compression. Silver alginate to the wound as the primary dressing 03/18/2019 on evaluation today patient appears to be doing a little better compared to last time I saw him. He really has not been using his compression pumps he tells me that he is having too much discomfort. He has been keeping his wraps on however. He is only been taking his fluid pills every other day because he states they are not really helping and he has an appointment with his primary care provider at the [facility] tomorrow. Subsequently the wound itself on the left lower extremity does seem to be greatly improved compared to previous. 03/25/2019 on  evaluation today patient appears to be doing better with regard to his wounds on the bilateral lower extremities. The left is doing excellent the right is also doing better although both still do show some signs of open wounds noted at this point unfortunately. Fortunately there is no signs of active infection at this time. The patient also is not really having any significant pain which is good news. Unfortunately there was some confusion with the referral on vascular disease and as far as getting the patient scheduled there can be contacting him later today to do this [name], [name] ([id number]) fortunately we got this straightened out. 04/01/2019 on evaluation today patient appears to be doing no fevers, chills, nausea, vomiting, or diarrhea. Excellent at this time with regard to his lower extremities. There does not appear to be any open wound at this point which is good news. Fortunately is also no signs of active infection at this time. Overall feel like the patient has done excellent with the compression the problem is every time we got him to this point and then subsequently go to using his own compression things just go right back to where they were. I am not sure how to address this we can try to get an appointment with vascular for 2 weeks now they have yet to call him. Obviously this has become frustrating for the patient as well. I think the issue has just been an honest error as far as scheduling is concerned but nonetheless still worn out the point where I am unsure of which direction we should take. 04/08/2019 on evaluation today patient actually appears to be doing well with regard to his lower extremities. There are no open wounds at this time and things seem to be managing quite nicely as far as the overall edema control is concerned. With that being said he does have his compression socks today for [place] to go ahead and reinitiate therapy in that manner at this point. He is going to be  going for shoes to be measured on Wednesday and then coincidentally he will also be seeing vascular on Thursday. Overall I think this is good news and  again I am hopeful that they will be able to do something for him to help prevent ongoing issues with edema control as well. No fevers, chills, nausea, vomiting, or diarrhea. 04/11/2019 on evaluation today patient actually appears to be doing poorly after just being discharged on Monday of this week. He had been experiencing issues with again blisters especially on the left lower extremity. With that being said he was completely healed and appeared to be doing great this past Monday. He then subsequently has new blisters that formed before his appointment with vascular this morning. He was also measured for shoes in the interim. With that being said we may have figured out what exactly is going on and why he continues to have issues like what we are seeing at this point. He takes his compression stockings off at nighttime and then he ends up having to sleep in his chair for 5-6 hours a night. He sleeps with his feet down he cannot really get him up in the recliner and therefore he is sleeping and the worst possible his position with his feet on the floor for that majority of the time. Again as I explained to him that is about one third at minimum at least one fourth of his day that he spending with his feet dangling down on the ground and the worst possible position they could be. I think this may be what is causing the issue. Subsequently I am leaning toward thinking that he may need a hospital bed in order to elevate his legs. We likely can have to coordinate this with his primary care provider at the [facility]. Readmission: 01/26/2021 this is a patient who presents for repeat evaluation here in the clinic although it is actually been couple of years since have seen him in fact it was December 2020 when I last saw him. Subsequently he never really  healed but did end up being lost to follow-up. He tells me has been having issues ongoing with his lower extremities has bilateral lower extremity lymphedema no real significant or definitive open wounds but in general his lymphedema is way out of control. We were never able to refer him to lymphedema clinic simply due to the fact to be honest we were never able to get him completely healed. I do not see anyone with open wounds. The patient does have evidence of type 2 diabetes mellitus, lymphedema, chronic venous insufficiency, and hypertension. That really has not changed since his last evaluation. 02/09/2021 upon evaluation today patient appears to be doing a little better in regard to his legs although he still having a tremendous amount of drainage especially on the left leg. Fortunately there does not appear to be any evidence of active infection. Of note when we looked into this further it appears that the patient did not have any absorptive dressing on it was just the 4-layer compression wrap. Nonetheless this is probably big part of the issue here. 10/10; he comes in today with 3 large areas on the upper right lower leg likely remanence of denuded blistering under his compression wraps. He has no other wounds on the right. On the left he has the denuded area on the left medial foot and ankle and on the left dorsal foot. Massive lymphedema in both feet dorsally. Using Zetuvit under compression We have increased home health visitation to twice a week to change the dressings and will change it once 02/22/2021 upon evaluation today patient appears to be doing well currently  with regard to his wounds. He has been tolerating the dressing changes without complication. Fortunately there does not appear to be any evidence of active infection which is great news. No fevers, chills, nausea, vomiting, or diarrhea. The biggest issue I see currently is that home health is not putting any medicine on the  actual wounds before wrapping. 03/01/2021 upon evaluation today the patient's right leg actually appears to be doing quite well which is great news there does not appear to be any evidence of active infection at this time. No fevers, chills, nausea, vomiting, or diarrhea. With that being said the patient is having issues on the left foot where he is having significant drainage is also an ammonia smell he does not have any animals at home and this makes me concerned about a bacteria producing urea as a byproduct. Again the possible common organisms will be E. coli, Proteus, and Enterococcus. All 3 of which can be successfully treated with Levaquin. For that reason I think that this may be a good option for [place] to consider placing him on and I did obtain a culture as well for confirmation sake. 03/08/2021 upon evaluation today patient appears to be doing unfortunately still somewhat poorly in regard to his leg ulcerations. He actually has an area on the right leg where he blistered due to the fact that his wrap slid down and caused an area of pinching on his skin and this has led to a significant issue here. 03/15/2021 upon evaluation today patient unfortunately has not been wrapped appropriately with absorptive dressings nor with the appropriate technique for the third layer of the 4-layer compression wrap. These are issues that we continue to try to address with the home health nurse. Also the absorptive dressing that she had was cut in half and therefore that causes things to leak out it does not actually trap the fluid in regard to the top of the foot overall I think that all these combined are really not seeing things improved significantly here. Fortunately there does not appear to be any signs of significant infection at this time which is good news. He still is having a tremendous amount of drainage. 03/22/2021 upon evaluation today patient appears to be draining tremendously. He still continues  to tell me that he is using his pumps 2 times a day and that coupled with that tells me that he is elevating his legs as well. With that being said all things considered I am really just not seeing the improvement we would expect to see with the 4-layer compression wrap and all the above noted. He in fact had an extremely large Zetuvit dressing on both legs and that they were extremely filled to the max with fluid. This is after just being changed just before the weekend and this is Monday. Nonetheless I am concerned about the fact that there is something going on fairly significant that we cannot get any of this under control and that he is draining this significantly. He supposed be having an echocardiogram it sounds like scheduling has been an issue for him as far as getting in sooner. Its something to do with needing his cousin to drive him because of where it sat and he cannot drive himself to this appointment either way I really think he needs to try to see what he can do about making this happen a little sooner. He tells me he will call today. [name], PITTA ([id number]) 03/29/2021 on evaluation today patient appears to be  doing about the same in regard to his legs. He did get his cardiology appointment moved up to 6 December which is at least good that is better than what it was before mid December. Overall very pleased in that regard. 04/05/2021 upon evaluation today patient unfortunately is still doing fairly poorly. There does not appear to be any signs of active infection at this time. No fevers, chills, nausea, vomiting, or diarrhea. Unfortunately I think until his edema is under control and overall fluid overload there is really not to be much chance that I can do much to get him better. This is quite unfortunate and frustrating both for myself and the patient to be perfectly honest. Nonetheless I think that he really needs to have a conversation both with his primary care provider as well  as cardiologist he sees the PCP on Monday and cardiology on Wednesday of next week. 04/13/2021 upon evaluation today patient appears to be doing poorly in regard to his bilateral lower extremities his left is still worse than the right. With that being said he has a tremendous amount of drainage he did see his primary care provider yesterday there really was not much there to be done from their perspective. He sees cardiology tomorrow. Nonetheless my biggest concern here is simply that if we do not get the edema under control he is going to continue to have drainage and honestly I think at some point he is going to become infected severely that is my main concern. 04/19/2021 upon evaluation today patient appears to be doing poorly still in regard to his legs. Unfortunately there does not appear to be any signs of infection at this point. He does have a tremendous amount of drainage however. We have not seen the results back from the cardiologist and the echocardiogram that was done. It appears that the patient checked out okay as far as that is concerned with regard to ejection fraction though we still have some issues here to be honest with his diastolic function. I am unsure if this is accounting for everything that we are seeing or not. Either way he has a tremendous amount of drainage from his legs that we are just not able to control in the outpatient setting at this point. I have reached out to Dr. Rockey Situ his cardiologist to see once he reviews the sheet if there is anything that he feels like can be done from an outpatient perspective if not then I think the way to go is probably can to be through inpatient admission and diuresis. Otherwise I am not sure how working to get this under control we tried antibiotics, compression wrapping, and I have told the patient to be elevating his legs I am not sure how much he does of this but either way I think that this is still an ongoing issue  nonetheless. 04/26/2021 upon evaluation today patient appears to be doing poorly in regard to his legs. He is having a tremendous amount of fluid at this point which is quite unfortunate. Its to the point that he may have had at least 5 to 10 pounds of fluid in his dressings this morning when they were removed these were changed this Friday. Subsequently I think he needs to go to the ER for further evaluation and treatment I think is probably can need diuresis possibly even IV antibiotics been on what the blood work looks like but in general I feel like he needs something to get this under control from  an outpatient perspective absent of everywhere I can think of and I cannot get this under control with our traditional measures. I think this is going require more so that we can get him better 12/30; this is a patient with severe bilateral lymphedema. He was hospitalized from 04/26/2021 through 04/29/2021 treated for cellulitis in the setting of lower extremity ulcers and lymphedema. After he left the hospital he is apparently seen for nurse visit our staff contacted cardiology and he has been started on Lasix 40 mg. Apparently his legs have less edema. Lab work from 05/04/2021 showed a BUN of 38 and creatinine of 1.59 these are elevated versus previous where his creatinine seems to have been 1.30 on 12/19 his potassium is 4.3. I believe the lab work is being followed by cardiology We have him in a 4-layer wrap. Xeroform on the leg wounds and sit to fit on the Berry damage skin on the left dorsal foot versus right dorsal foot. He has compression pumps but does not use them. We have apparently not yet ordered him compression stockings 05/17/2021 upon evaluation today patient's legs though better than last time I personally saw him appear to be getting worse compared to where they were previous. Dr. Quentin Cornwall was actually last 1 to see you I have not seen him since 19 December. That was before he went into  the ER. Coming out apparently his legs looked also and they still look better but not as good as they were in the past. 1/16; patient with severe bilateral lymphedema. Severe scaled hyperkeratotic skin on the dorsal aspect of his distal left foot and left medial ankle.. On the right side changes are not as bad. He did not have any weeping edema. Our intake nurse was convinced that he is being compliant with compression pumps 1 hour twice a day 05/31/2021 upon evaluation today patient actually appears to be doing a little bit better in my opinion in regard to his feet. I do not see as much drainage and it being just completely wet as it was previous. Fortunately I do not also see any signs of active infection which is great news as well. 06/07/21 Upon inspection patient's wound bed actually showed signs of doing well he is not nearly as weepy and wet as he has been in the past and overall very pleased in that regard. Fortunately I do not see any signs of active infection locally or nor systemically at this time. Which is great news. No fevers, chills, nausea, vomiting, or diarrhea. 06/14/2021 upon evaluation today patient appears to be doing well with regard to his right foot I am pleased in that regard. His left foot is still draining quite a bit despite using lymphedema pumps, 4-layer compression wraps, and he tells me elevating his legs as well. He also has Lasix that he takes twice a day. Nonetheless I believe that this is still good to be an ongoing issue. We have a hard time getting this under control as far as the swelling is concerned. 06/21/2021 upon evaluation today patient appears to be doing decently well in regard to his wounds all things considered. He still has a tremendous amount of drainage and fluid noted at this point. Fortunately I do not see any signs of active infection locally or systemically at this point which is great news. Nonetheless I am unsure where to go and how to do this  as far as trying to limit his swelling and weeping from his toes in particular.  06/28/2021 upon evaluation patient unfortunately continues to have significant drainage from his feet. We have been keeping him in a compression wrap and despite this he still continues to have extreme fluid issues he seen his cardiologist he is seeing the nephrologist. We really cannot find any way to get this under control when he did well was when he was in the hospital and they got some of the fluid away. But outside of that we are just struggling to achieve the long-term goal of getting this under control and keeping it under control unfortunately. 07/05/2021 upon evaluation today patient appears to be doing poorly in regard to his feet. Unfortunately this continues to be a significant issue and to be honest I am really not certain what to do about it. I referred him to Dr. Randol Kern at [facility] and he does have an appointment although it is 12 April. He also sees his primary care provider on 6 April. He did not want to see Dr. Haynes Kerns until after he saw his PCP that is the reason the appointment so far out. There is really not much I can do in that regard. Nonetheless I do think that we are still continue to have significant lymphedema issues with significant mount of weeping in regard to the feet and again this has just become extremely difficult to manage to be honest I am not sure if there is something else that Dr. Haynes Kerns or someone else could recommend he also will be seeing Dr. Dellia Nims in 2 weeks when I am on vacation and at that time I will see if Dr. Dellia Nims has any ideas about where to go from here in the meantime. [name], [name] ([id number]) 07/12/2021 upon evaluation today patient appears to be doing about as well as can be expected with regard to his feet. He does actually see his kidney doctor this Friday. He also will be seeing his primary care provider on April 4 and then following that around mid April he will be  seeing Dr. Haynes Kerns at [facility] which was a referral made for him. Again my goal is to try to find out some way to fix this and to be perfectly honest we have had some issues with making any good adjustments. When he was in the hospital and greater amounts of Lasix he was able to get this down and it looked much better upon discharge. With that being said right now things just are not doing nearly as good as what they used to be. 3/13; patient presents for follow-up. He has been using his lymphedema pumps over the past week. He reports an increase in his Lasix dose. He has no issues or complaints today. He denies signs of infection. 07/26/2021 upon evaluation today patient appears to be doing better in regard to his feet bilaterally. Both are showing signs of much less drainage which is great news and overall very pleased in that regard. Fortunately there does not appear to be any evidence of active infection locally or systemically at this time. No fevers, chills, nausea, vomiting, or diarrhea. 08/02/2021 upon evaluation today patient appears to be doing well with regard to his feet. Both are showing signs of being drier the right pretty much has not really draining much at all which is great news. The left is not draining anywhere close to his much as it was during the last evaluation. This is excellent news and overall very pleased. 08/09/2021 upon evaluation today patient appears to be doing well with regard to  his legs the right leg especially showing signs of excellent improvement which is great news I do not see any evidence of active infection locally or systemically which is great. In regard to the left leg he still has some weeping and drainage but nothing as significant as what it was in the past this is great news. 08-16-2021 upon evaluation today patient appears to actually be doing quite well in my opinion in regard to his feet. This is significantly improved compared to what we previously seen  and overall I am extremely pleased in that regard. I do believe that He is actually improving although this is obviously very slow going. 08-23-2021 upon evaluation today patient appears to be doing well currently in regard to his right leg which actually is pretty dry at this point today. Fortunately I do not see any evidence of active infection at this time which is great news. No fevers, chills, nausea, vomiting, or diarrhea. 08-30-2021 upon evaluation today patient appears to be doing well with regard to his lower extremities. The right foot is pretty much completely dry which is great news the left foot though not completely dry seems to be doing decently well. I do not see any signs of active infection locally or systemically which is great news. No fevers, chills, nausea, vomiting, or diarrhea. 09-06-2021 upon evaluation today patient appears to be doing well with regard to his feet in fact now the left foot is almost completely dry as well and I am definitely seeing a lot of significant improvement. With that being said unfortunately he actually appears to have some cellulitis of his right thigh. His toes are also little bit red but this may just be due to the increased swelling. He really is not warm to touch in regard to the toes. 5/8; excellent edema control on the right foot and lower leg there is no open wounds but we continue to put compression on this otherwise this will breakdown. He is using his compression pumps twice a day at home The area that is problematic is on the left dorsal foot some areas that are not fully epithelialized with very dry fissured skin over this area. 09-20-2021 upon evaluation today patient appears to be doing a little bit worse in regard to swelling at this time. Fortunately I do not see any signs of infection with that being said the wrap was not on quite as well as what I would like to have seen. I do believe that this has caused a little bit of excess swelling  and again we need to try to get this under good control. 09-27-2021 upon evaluation today patient appears to be doing awesome in regard to his feet and legs. Everything is measuring smaller the swelling is down and to be perfectly honest I am extremely pleased with the end of his feet especially on the left side and how dry this is today. I do think we are on the right track here. 5/31; patient presents for follow-up. He is using nystatin powder to the feet bilaterally under 4-layer compression. He has no issues or complaints today. He states he is going to the lymphedema clinic tomorrow. Since he has no open wounds on the right lower extremity they will be focused on the side. 10-11-2021 upon evaluation today patient appears to be doing excellent in regard to his legs. He has been tolerating the dressing changes without complication. Fortunately there does not appear to be any evidence of active infection locally or  systemically at this time which is great news. No fevers, chills, nausea, vomiting, or diarrhea. 10-18-2021 upon evaluation today patient appears to be doing well with regard to his legs. He has been tolerating the dressing changes without complication and actually seen in lymphedema clinic now in regard to his right leg were taken care of the left leg. Fortunately I do not see any evidence of active infection locally or systemically at this time. 10-26-2021 upon evaluation today patient's wounds actually are showing signs of doing well in fact he really does not have wounds as much is weeping of the lower extremities. Fortunately I do not see any evidence of active infection locally or systemically which is great news. No fever or chills noted 11-02-2021 upon evaluation today patient appears to be doing decently well in regard to his legs. Of actually been on the phone quite a bit discussing with [facility] orthotics custom compression for him. I also have been discussing everything with his  lymphedema clinic provider as well. Helene Kelp notes that there is really not much more that she can do for him which was noted last week as well. Subsequently I do believe that the patient would benefit from getting custom compression in fact I think that is what he is going require to keep anything under control here. He voiced understanding. 11-08-2021 upon evaluation today patient appears to be doing excellent in regard to his legs and feet. Fortunately I do not see any signs of infection at this time which is great news. No fever or chills noted again I am still been working on trying to figure out what exactly we need to do as far as getting the [place] involved in coverage for custom compression for this patient. I also discussed with him that he does need to have custom shoes which I completely understand. With that being said it does make it difficult thinking about what kind of combination of shoes and compression is going to be best for him for the long-term. Fortunately I do not see any evidence of active infection locally or systemically which is great news. Overall I think you are doing quite well. 11-16-2021 upon evaluation today patient appears to be doing well currently in regard to his legs and feet. Everything is significantly smaller [place], [name] ([id number]) compared to what has been. Fortunately I do not see any evidence of active infection locally or systemically at this time which is great news. 11-23-2021 upon evaluation today patient appears to be doing well with regard to his legs bilaterally. We can actually initiate treatment with a recommendation today to utilize Tubigrip and try to see if we can get this under better control. The patient is in agreement with the plan. Nonetheless the big question is whether this will be something that he can utilize at home in order to keep his swelling down and allow him to be able to function without having to come into the clinic as frequently.  He notes that he would love to get to this point. He does have a cousin who is willing to help him with change out the Tubigrip when necessary. 7/25; the patient does not have any open wounds however his left leg is a lot more swollen than it was last week. His cousin came in who presumably would be the one to change his juxta lites to practice on doing this for the patient. She apparently lives in the [place] block. In the meantime he has been using Tubigrip 12-07-2021 upon  evaluation today patient appears to be doing well with regard to his legs although we are transitioning to try to get him to use the Tubigrip which again has been a little bit of a setback but not too much overall I think he is still doing well he still able to wear his current shoes which is good news. He does have a cousin who is willing to help him with the Tubigrip and we are training her how to do this as well. She is here today. 12-14-2021 upon evaluation today patient appears to be doing well currently in regard to his right leg which I think is doing excellent with the Tubigrip. With regard to the left leg unfortunately this is not doing nearly as well and will get me to do something to intervene here. I think getting back into a 4-layer wraps can be the way to go short-term To get this back under control and the patient voiced understanding. 12-21-2021 upon evaluation today patient appears to be tolerating the dressing changes currently without complication the wrap of the left leg is doing well the right leg reason Tubigrip. The wound is looking significantly improved. 12-28-2021 upon evaluation today patient appears to be doing well currently in regard to his wound. He has been tolerating the dressing changes without complication. Fortunately there is no evidence of active infection locally or systemically at this time. I think he is very close to complete resolution on the left leg and I think we are ready to go back to the  Tubigrip. 01-04-2022 upon evaluation today patient appears to be doing okay in regard to his legs of the more swollen because he was rolling and pushing the Tubigrip down because it felt "too tight". With that being said I discussed with the patient today that we really do need to keep this pulled up and if he keeps it pulled up and when it starts to feel tight gets to where he can elevate his legs I think that he can keep this under control. I would suggest if it starts feeling tight that he lay down where he can elevate his legs above heart level to basically help the swelling to go down is much as possible. He voiced understanding. I also told him that is can to be his job to keep these pulled up and that if he is not able to do this I Georgina Peer probably have to refer him elsewhere to get a second opinion as I have pretty much done everything I can try to get his legs under control and keep them under control he is much better than where he used to be and his feet can actually fit in his shoes which is great but in general I just do not know what else to do he has his cousin helping with putting the Tubigrip's on which is awesome and a great resource for him but again she cannot be there to keep them pulled up and nor is that even her job. 01-11-2022 upon evaluation today patient's legs are looking much better. He actually kept the Tubigrip up this week which has made a tremendous difference and improvement in his overall appearance today. Fortunately I do not see any signs of active infection locally or systemically at this time. 01-18-2022 upon evaluation today patient appears to be doing well currently in regard to his legs. If active does not appear to be anything open at this point. This is great news. 01-25-2022 upon evaluation today patient  appears to be doing well currently in regard to his legs. Fortunately I do not see any signs of significant open wounds which is great news and overall I do  believe that he is doing quite well. I do not see any evidence of active infection locally or systemically at this time which is great news. No fevers, chills, nausea, vomiting, or diarrhea. Electronic Signature(s) Signed: 01/25/2022 3:19:00 PM By: Worthy Keeler PA-C Entered By: Worthy Keeler on 01/25/2022 15:19:00 [name] ([id number]) -------------------------------------------------------------------------------- Physical Exam Details Patient Name: [name] Date of Service: 01/25/2022 11:00 AM Medical Record Number: [id number] Patient Account Number: [id number] Date of Birth/Sex: 1950-05-30 (71 y.o. M) Treating RN: [name] Primary Care Provider: Fontaine No Other Clinician: Referring Provider: Fontaine No Treating Provider/Extender: Skipper Cliche in Treatment: 64 Constitutional Well-nourished and well-hydrated in no acute distress. Respiratory normal breathing without difficulty. Psychiatric this patient is able to make decisions and demonstrates good insight into disease process. Alert and Oriented x 3. pleasant and cooperative. Notes Upon inspection patient's wound bed actually showed signs of good granulation and epithelization at this point. Fortunately I do not see any evidence of active infection locally or systemically which is great news and overall I am extremely pleased with where we stand currently. Electronic Signature(s) Signed: 01/25/2022 3:19:17 PM By: Worthy Keeler PA-C Entered By: Worthy Keeler on 01/25/2022 15:19:16 [name] ([id number]) -------------------------------------------------------------------------------- Physician Orders Details Patient Name: [name] Date of Service: 01/25/2022 11:00 AM Medical Record Number: [id number] Patient Account Number: [id number] Date of Birth/Sex: 1951/03/26 (71 y.o. M) Treating RN: [name] Primary Care Provider: Fontaine No Other Clinician: Referring Provider:  Fontaine No Treating Provider/Extender: Skipper Cliche in Treatment: 69 Verbal / Phone Orders: No Diagnosis Coding ICD-10 Coding Code Description E11.622 Type 2 diabetes mellitus with other skin ulcer I89.0 Lymphedema, not elsewhere classified I87.2 Venous insufficiency (chronic) (peripheral) L97.822 Non-pressure chronic ulcer of other part of left lower leg with fat layer exposed L97.512 Non-pressure chronic ulcer of other part of right foot with fat layer exposed I10 Essential (primary) hypertension L97.811 Non-pressure chronic ulcer of other part of right lower leg limited to breakdown of skin Follow-up Appointments o Return Appointment in 1 week. o Nurse Visit as needed Bathing/ Shower/ Hygiene o May shower with wound dressing protected with water repellent cover or cast protector. o No tub bath. Anesthetic (Use 'Patient Medications' Section for Anesthetic Order Entry) o Lidocaine applied to wound bed Edema Control - Lymphedema / Segmental Compressive Device / Other Bilateral Lower Extremities o 4 Layer Compression System Lymphedema. - left side with nystatin between toes and silver cell to any weeping areas with tubi c double layer to ankle/foot o Tubigrip double layer applied - Nystatin powder between toes double size d bilat just behind toes to patella knotch , double size C foot ankles enclose toes to just above ankle scell to any weeping areas o Elevate, Exercise Daily and Avoid Standing for Long Periods of Time. o Elevate legs to the level of the heart and pump ankles as often as possible o Elevate leg(s) parallel to the floor when sitting. o Compression Pump: Use compression pump on left lower extremity for 60 minutes, twice daily. - 2 times per day o DO YOUR BEST to sleep in the bed at night. DO NOT sleep in your recliner. Long hours of sitting in a recliner leads to swelling of the legs and/or potential wounds on your backside. o  Other: - Contact prescriber regarding use  of diuretics to reduce fluid overload. Off-Loading o Turn and reposition every 2 hours Additional Orders / Instructions o Follow Nutritious Diet and Increase Protein Intake Medications-Please add to medication list. o P.O. Antibiotics Electronic Signature(s) Signed: 01/25/2022 5:18:45 PM By: Worthy Keeler PA-C Signed: 01/28/2022 9:44:45 AM By: [name] RN Entered By: [name] on 01/25/2022 11:26:19 [name] ([id number]) -------------------------------------------------------------------------------- Problem List Details Patient Name: [name] Date of Service: 01/25/2022 11:00 AM Medical Record Number: [id number] Patient Account Number: [id number] Date of Birth/Sex: Oct 22, 1950 (71 y.o. M) Treating RN: [name] Primary Care Provider: Fontaine No Other Clinician: Referring Provider: Fontaine No Treating Provider/Extender: Skipper Cliche in Treatment: 48 Active Problems ICD-10 Encounter Code Description Active Date MDM Diagnosis E11.622 Type 2 diabetes mellitus with other skin ulcer 01/26/2021 No Yes I89.0 Lymphedema, not elsewhere classified 01/26/2021 No Yes I87.2 Venous insufficiency (chronic) (peripheral) 01/26/2021 No Yes L97.822 Non-pressure chronic ulcer of other part of left lower leg with fat layer 01/26/2021 No Yes exposed L97.512 Non-pressure chronic ulcer of other part of right foot with fat layer 01/26/2021 No Yes exposed [place] (primary) hypertension 01/26/2021 No Yes L97.811 Non-pressure chronic ulcer of other part of right lower leg limited to 02/15/2021 No Yes breakdown of skin Inactive Problems Resolved Problems Electronic Signature(s) Signed: 01/25/2022 11:06:10 AM By: Worthy Keeler PA-C Entered By: Worthy Keeler on 01/25/2022 11:06:09 [name] ([id number]) -------------------------------------------------------------------------------- Progress Note  Details Patient Name: [name] Date of Service: 01/25/2022 11:00 AM Medical Record Number: [id number] Patient Account Number: [id number] Date of Birth/Sex: [birth year] (71 y.o. M) Treating RN: [name] Primary Care Provider: Fontaine No Other Clinician: Referring Provider: Fontaine No Treating Provider/Extender: Skipper Cliche in Treatment: 65 Subjective Chief Complaint Information obtained from Patient Left LE ulcers History of Present Illness (HPI) 71 year old male who presented to the ER with bilateral lower extremity blisters which had started last week. he has a past medical history of leukemia, diabetes mellitus, hypertension, edema of both lower extremities, his recurrent skin infections, peripheral vascular disease, coronary artery disease, congestive heart failure and peripheral neuropathy. in the ER he was given Rocephin and put on Silvadene cream. he was put on oral doxycycline and was asked to follow-up with the [facility]. His last hemoglobin A1c was 6.6 in December and he checks his blood sugar once a week. He does not have any physicians outside the [place] system. He does not recall any vascular duplex studies done either for arterial or venous disease but was told to wear compression stockings which he does not use 05/30/2016 -- we have not yet received any of his notes from the [facility] hospital system and his arterial and venous duplex studies are scheduled here in [place] around mid February. We are unable to have his insurance accepted by home health agencies and hence he is getting dressings only once a week. 06/06/16 -- -- I received a call from the patient's PCP at the [facility] at [facility] and spoke to Dr. Garvin Fila, phone number [phone] and fax number [phone]. She confirmed that no vascular testing was done over the last 5 years and she would be happy to do them if the patient did want them to be done at the [place] and we could fax him a  request. Readmission: 71 year old male seen by as in February of this year and was referred to vein and vascular for studies and opinion from the vascular surgeons. The patient returns today with a fresh problem having had blisters on his  left lower extremity which have been there for about 5 days and he clearly states that he has been wearing his compression stockings as advised though he could not read the moderate compression and has been wearing light compression. Review of his electronic medical records note that he had lower extremity arterial duplex examination done on 06/23/2016 which showed no hemodynamically significant stenosis in the bilateral lower extremity arterial system. He also had a lower extremity venous reflux examination done on 07/07/2016 and it was noted that he had venous incompetence in the right great saphenous vein and bilateral common femoral veins. Patient was seen by Dr. Tamala Julian on the same day and for some reason his notes do not reflect the venous studies or the arterial studies and he recommended patient do a venous duplex ultrasound to look for reflux and return to see him.he would also consider a lymph pump if required. The patient was told that his workup was normal and hence the patient canceled his follow-up appointment. 02/03/17 on evaluation today patient left medial lower extremity blister appears to be doing about the same. It is still continuing to drain and there's still the blistered skin covering the wound bed which is making it difficult for the alternate to do its job. Fortunately there is no evidence of cellulitis. No fevers chills noted. Patient states in general he is not having any significant discomfort. Patient's lower extremity arterial duplex exam revealed that patient was hemodynamically stable with no evidence of stenosis in regard to the bilateral lower extremities. The lower extremity venous reflux exam revealed the patient had venous  incontinence noted in the right greater saphenous and bilateral common femoral vein. There is no evidence of deep or superficial vein thrombosis in the bilateral lower extremities. Readmission: 11/12/18 Patient presents for evaluation our clinic today concerning issues that he is having with his left lower extremity. He tells me that a couple weeks ago he began developing blisters on the left lower extremity along with increased swelling. He typically wears his compression stockings on a regular basis is previously been evaluated both here as well is with vascular surgery they would recommend lymphedema pumps but unfortunately that somehow fell through and he never heard anything back from that. Nonetheless I think lymphedema pumps would be beneficial for this patient. He does have a history of hypertension and diabetes. Obviously the chronic venous stasis and lymphedema as well. At this point the blisters have been given in more trouble he states sometimes when the blisters openings able to clean it down with alcohol and it will dry out and do well. Unfortunately that has not been the case this time. He is having some discomfort although this mean these with cleaning the areas he doesn't have discomfort just on a regular basis. He has not been able to wear his compression stockings since the blisters arose due to the fact that of course it will drain into the socks causing additional issues and he didn't have any way to wrap this otherwise. He has increased to taking his Lasix every day instead of every other day. He sees his primary care provider later this month as well. No fevers, chills, nausea, or vomiting noted at this time. 11/19/18-Patient returns at 1 week, per intake RN the amount of seepage into the compression wraps was definitely improved, overall all the wounds are measuring smaller but continuing silver alginate to the wounds as primary dressing 11/26/18 on evaluation today patient  appears to be doing quite well in  regard to his left lower [place] ulcers. In fact of the areas that were noted initially he only has two regions still open. There is no evidence of active infection at this time. He still is not heard anything from the company regarding lymphedema pumps as of yet. Again as previously seen vascular they have not recommended any surgical intervention. [name], [name] ([id number]) 12/03/2018 on evaluation today patient actually appears to be doing quite well with regard to his lower extremity ulcers. In fact most of the areas appear to be healed the one spot which does not seem to be completely healed I am unsure of whether or not this is really draining that much but nonetheless there does not appear to be any signs of infection or significant drainage at this point. There is no sign of fever, chills, nausea, vomiting, or diarrhea. Overall I am pleased with how things have progressed I think is very close to being able to transition to his home compression stockings. 12/10/2018 upon evaluation today patient appears to be doing quite well with regard to his left lower extremity. He has been tolerating the dressing changes without complication. Fortunately there is no signs of active infection at this time. He appears after thorough evaluation of his leg to only have 1 small area that remains open at this point everything else appears to be almost completely closed. He still have significant swelling of the left lower extremity. We had discussed discussing this with his primary care provider he is not able to see her in person they were at the [facility] and right now the [place] is not seeing patients on site. According to the patient anyway. Subsequently he did speak with her apparently and his primary care provider feels that he may likely have a DVT. With that being said she has not seen his leg she is just going off of his history. Nonetheless that is a concern that the  patient now has as well and while I do not feel the DVT is likely we can definitely ensure that that is not the case I will go ahead and see about putting that order in today. Nonetheless otherwise I am in a recommend that we continue with the current wound care measures including the compression therapy most likely. We just need to ensure that his leg is indeed free of any DVTs. 12/17/2018 on evaluation today patient actually appears to be completely healed today. He does have 2 very small areas of blistering although this is not anything too significant at this point which is good news. With that being said I am in agreement with the fact that I think he is completely healed at this point. He does want to get back into his compression stocking. The good news is we have gotten approval from insurance for his lymphedema pumps we received a letter since last saw him last week. The other good news is his study did come back and showed no evidence of a DVT. 12/20/2018 on evaluation today patient presents for follow-up concerning his ongoing issues with his left lower extremity. He was actually discharged last Friday and did fairly well until he states blisters opened this morning. He tells me he has been wearing his compression stocking although he has a hard time getting this on. There does not appear to be any signs of active infection at this time. No fevers, chills, nausea, vomiting, or diarrhea. 12/27/2018 on evaluation today patient appears to be doing very well with regard to  his swelling of the left lower extremity the 4 layer compression wrap seems to have been beneficial for him. Fortunately there is no signs of active infection at this time. Patient has been tolerating the compression wrap without complication and his foot swelling in particular appears to be greatly improved. He does still have a wound on the lateral portion of his left leg I believe this is more of a blister that has now  reopened. 01/03/2019 on evaluation today patient actually appears to be doing excellent in regard to his left lower extremity. He did receive his compression pumps and is actually use this 7 times since he was last here in the office. On top of the compression wrap he is now roughly 3 cm better at the calf and 2 cm better at the ankle he also states that his foot seem to go an issue better without even having to use a shoe horn. Obviously I think this is all evidence that he is doing excellent in this regard. The other good news is he does not appear to have anything open today as far as wounds are concerned. 01/15/2019 on evaluation today patient appears to be doing more poorly yet again with regard to his left lower extremity. He has developed new wounds again after being discharged just recently. Unfortunately this continues to be the case that he will heal and then have subsequent new wounds. The last time I was hopeful that he may not end up coming back too quickly especially since he states he has been using his lymphedema pumps along with wearing his compression. Nonetheless he had a blister on the back of his leg that popped up on the left and this has opened up into an ulceration it is quite painful. 01/22/19 on evaluation today patient actually appears to be doing well with regard to his wound on the left lower extremity. He's been tolerating the dressing changes without complication including the compression wrap in the wound appears to be significantly smaller today which is great news. Overall very pleased in this regard. 01/29/2019 on evaluation today patient appears to be doing well with regard to his left posterior lower extremity ulcer. He has been tolerating the dressing changes without complication. This is not completely healed but is getting much closer. We did order a Farrow wrap 4000 for him he has received this and has it with him today although I am not sure we are quite ready to  start him on that as of yet. We are very close. 02/05/2019 on evaluation today patient actually appears to be doing quite well with regard to his left posterior lower extremity ulcer. He still has a very tiny opening remaining but the fortunate thing is he seems to be healing quite nicely. He also did get his Farrow wrap which I am hoping will help with his edema control as well at home. Fortunately there is no evidence of active infection. 02/12/2019 patient and fortunately appears to be doing poorly in regard to his wounds of the left lower extremity. He was very close to healing therefore we attempted to use his Velcro compression wraps continuing with lymphedema pumps at home. Unfortunately that does not seem to have done very well for him. He tells me that he wore them all the time but again I am not sure why if that is the case that he is having such significant edema. He is still on his fluid pills as well. With that being said there is  no obvious sign of infection although I do wonder about the possibility of infection at this time as well. 02/19/2019 unfortunately upon evaluation today patient appears to be doing more poorly with regard to his left lower extremity. He is not showing signs of significant improvement and I think the biggest issue here is that he does have an infection that appears to likely be Pseudomonas. That is based on the blue-green drainage that were noted at this time. Unfortunately the antibiotic that has been on is not going to take care of this at all. I think they will get a need to switch him to either Levaquin or Cipro and this was discussed with the patient. 02/26/2019 on evaluation today patient's lower extremity on the left appears to be doing significantly better as compared to last evaluation. Fortunately there is no signs of active infection at this time. He has been tolerating the compression wrap without complication in fact he made it the whole week at this  point. He is showing signs of excellent improvement I am very happy in this regard. With that being said he is having some issues with infection we did review the results of his culture which I noted today. He did have a positive finding for Enterobacter as well as Alcaligenes faecalis. Fortunately the Levaquin that I placed him on will work for both which is great news. There is no signs of systemic infection at this point. 10/30; left posterior leg wound in the setting of very significant edema and what looks like chronic venous inflammation. He has compression pumps but does not use them. We have been using 3 layer compression. Silver alginate to the wound as the primary dressing 03/18/2019 on evaluation today patient appears to be doing a little better compared to last time I saw him. He really has not been using his compression pumps he tells me that he is having too much discomfort. He has been keeping his wraps on however. He is only been taking his fluid pills every other day because he states they are not really helping and he has an appointment with his primary care provider at the [facility] tomorrow. Subsequently the wound itself on the left lower extremity does seem to be greatly improved compared to previous. KEINO, PLACENCIA ([id number]) 03/25/2019 on evaluation today patient appears to be doing better with regard to his wounds on the bilateral lower extremities. The left is doing excellent the right is also doing better although both still do show some signs of open wounds noted at this point unfortunately. Fortunately there is no signs of active infection at this time. The patient also is not really having any significant pain which is good news. Unfortunately there was some confusion with the referral on vascular disease and as far as getting the patient scheduled there can be contacting him later today to do this fortunately we got this straightened out. 04/01/2019 on evaluation today patient  appears to be doing no fevers, chills, nausea, vomiting, or diarrhea. Excellent at this time with regard to his lower extremities. There does not appear to be any open wound at this point which is good news. Fortunately is also no signs of active infection at this time. Overall feel like the patient has done excellent with the compression the problem is every time we got him to this point and then subsequently go to using his own compression things just go right back to where they were. I am not sure how to address this we  can try to get an appointment with vascular for 2 weeks now they have yet to call him. Obviously this has become frustrating for the patient as well. I think the issue has just been an honest error as far as scheduling is concerned but nonetheless still worn out the point where I am unsure of which direction we should take. 04/08/2019 on evaluation today patient actually appears to be doing well with regard to his lower extremities. There are no open wounds at this time and things seem to be managing quite nicely as far as the overall edema control is concerned. With that being said he does have his compression socks today for [place] to go ahead and reinitiate therapy in that manner at this point. He is going to be going for shoes to be measured on Wednesday and then coincidentally he will also be seeing vascular on Thursday. Overall I think this is good news and again I am hopeful that they will be able to do something for him to help prevent ongoing issues with edema control as well. No fevers, chills, nausea, vomiting, or diarrhea. 04/11/2019 on evaluation today patient actually appears to be doing poorly after just being discharged on Monday of this week. He had been experiencing issues with again blisters especially on the left lower extremity. With that being said he was completely healed and appeared to be doing great this past Monday. He then subsequently has new blisters that  formed before his appointment with vascular this morning. He was also measured for shoes in the interim. With that being said we may have figured out what exactly is going on and why he continues to have issues like what we are seeing at this point. He takes his compression stockings off at nighttime and then he ends up having to sleep in his chair for 5-6 hours a night. He sleeps with his feet down he cannot really get him up in the recliner and therefore he is sleeping and the worst possible his position with his feet on the floor for that majority of the time. Again as I explained to him that is about one third at minimum at least one fourth of his day that he spending with his feet dangling down on the ground and the worst possible position they could be. I think this may be what is causing the issue. Subsequently I am leaning toward thinking that he may need a hospital bed in order to elevate his legs. We likely can have to coordinate this with his primary care provider at the [facility] Readmission: 01/26/2021 this is a patient who presents for repeat evaluation here in the clinic although it is actually been couple of years since have seen him in fact it was December 2020 when I last saw him. Subsequently he never really healed but did end up being lost to follow-up. He tells me has been having issues ongoing with his lower extremities has bilateral lower extremity lymphedema no real significant or definitive open wounds but in general his lymphedema is way out of control. We were never able to refer him to lymphedema clinic simply due to the fact to be honest we were never able to get him completely healed. I do not see anyone with open wounds. The patient does have evidence of type 2 diabetes mellitus, lymphedema, chronic venous insufficiency, and hypertension. That really has not changed since his last evaluation. 02/09/2021 upon evaluation today patient appears to be doing a little better  in regard to his legs although he still having a tremendous amount of drainage especially on the left leg. Fortunately there does not appear to be any evidence of active infection. Of note when we looked into this further it appears that the patient did not have any absorptive dressing on it was just the 4-layer compression wrap. Nonetheless this is probably big part of the issue here. 10/10; he comes in today with 3 large areas on the upper right lower leg likely remanence of denuded blistering under his compression wraps. He has no other wounds on the right. On the left he has the denuded area on the left medial foot and ankle and on the left dorsal foot. Massive lymphedema in both feet dorsally. Using Zetuvit under compression We have increased home health visitation to twice a week to change the dressings and will change it once 02/22/2021 upon evaluation today patient appears to be doing well currently with regard to his wounds. He has been tolerating the dressing changes without complication. Fortunately there does not appear to be any evidence of active infection which is great news. No fevers, chills, nausea, vomiting, or diarrhea. The biggest issue I see currently is that home health is not putting any medicine on the actual wounds before wrapping. 03/01/2021 upon evaluation today the patient's right leg actually appears to be doing quite well which is great news there does not appear to be any evidence of active infection at this time. No fevers, chills, nausea, vomiting, or diarrhea. With that being said the patient is having issues on the left foot where he is having significant drainage is also an ammonia smell he does not have any animals at home and this makes me concerned about a bacteria producing urea as a byproduct. Again the possible common organisms will be E. coli, Proteus, and Enterococcus. All 3 of which can be successfully treated with Levaquin. For that reason I think that  this may be a good option for [place] to consider placing him on and I did obtain a culture as well for confirmation sake. 03/08/2021 upon evaluation today patient appears to be doing unfortunately still somewhat poorly in regard to his leg ulcerations. He actually has an area on the right leg where he blistered due to the fact that his wrap slid down and caused an area of pinching on his skin and this has led to a significant issue here. 03/15/2021 upon evaluation today patient unfortunately has not been wrapped appropriately with absorptive dressings nor with the appropriate technique for the third layer of the 4-layer compression wrap. These are issues that we continue to try to address with the home health nurse. Also the absorptive dressing that she had was cut in half and therefore that causes things to leak out it does not actually trap the fluid in regard to the top of the foot overall I think that all these combined are really not seeing things improved significantly here. Fortunately there does not appear to be any signs of significant infection at this time which is good news. He still is having a tremendous amount of drainage. 03/22/2021 upon evaluation today patient appears to be draining tremendously. He still continues to tell me that he is using his pumps 2 times a day and that coupled with that tells me that he is elevating his legs as well. With that being said all things considered I am really just not seeing the improvement we would expect to see with the 4-layer compression  wrap and all the above noted. He in fact had an extremely large Zetuvit dressing on both legs and that they were extremely filled to the max with fluid. This is after just being changed just before the weekend and this [name], [name] ([id number]) is Monday. Nonetheless I am concerned about the fact that there is something going on fairly significant that we cannot get any of this under control and that he is draining  this significantly. He supposed be having an echocardiogram it sounds like scheduling has been an issue for him as far as getting in sooner. Its something to do with needing his cousin to drive him because of where it sat and he cannot drive himself to this appointment either way I really think he needs to try to see what he can do about making this happen a little sooner. He tells me he will call today. 03/29/2021 on evaluation today patient appears to be doing about the same in regard to his legs. He did get his cardiology appointment moved up to 6 December which is at least good that is better than what it was before mid December. Overall very pleased in that regard. 04/05/2021 upon evaluation today patient unfortunately is still doing fairly poorly. There does not appear to be any signs of active infection at this time. No fevers, chills, nausea, vomiting, or diarrhea. Unfortunately I think until his edema is under control and overall fluid overload there is really not to be much chance that I can do much to get him better. This is quite unfortunate and frustrating both for myself and the patient to be perfectly honest. Nonetheless I think that he really needs to have a conversation both with his primary care provider as well as cardiologist he sees the PCP on Monday and cardiology on Wednesday of next week. 04/13/2021 upon evaluation today patient appears to be doing poorly in regard to his bilateral lower extremities his left is still worse than the right. With that being said he has a tremendous amount of drainage he did see his primary care provider yesterday there really was not much there to be done from their perspective. He sees cardiology tomorrow. Nonetheless my biggest concern here is simply that if we do not get the edema under control he is going to continue to have drainage and honestly I think at some point he is going to become infected severely that is my main concern. 04/19/2021  upon evaluation today patient appears to be doing poorly still in regard to his legs. Unfortunately there does not appear to be any signs of infection at this point. He does have a tremendous amount of drainage however. We have not seen the results back from the cardiologist and the echocardiogram that was done. It appears that the patient checked out okay as far as that is concerned with regard to ejection fraction though we still have some issues here to be honest with his diastolic function. I am unsure if this is accounting for everything that we are seeing or not. Either way he has a tremendous amount of drainage from his legs that we are just not able to control in the outpatient setting at this point. I have reached out to Dr. Rockey Situ his cardiologist to see once he reviews the sheet if there is anything that he feels like can be done from an outpatient perspective if not then I think the way to go is probably can to be through inpatient admission and diuresis. Otherwise  I am not sure how working to get this under control we tried antibiotics, compression wrapping, and I have told the patient to be elevating his legs I am not sure how much he does of this but either way I think that this is still an ongoing issue nonetheless. 04/26/2021 upon evaluation today patient appears to be doing poorly in regard to his legs. He is having a tremendous amount of fluid at this point which is quite unfortunate. Its to the point that he may have had at least 5 to 10 pounds of fluid in his dressings this morning when they were removed these were changed this Friday. Subsequently I think he needs to go to the ER for further evaluation and treatment I think is probably can need diuresis possibly even IV antibiotics been on what the blood work looks like but in general I feel like he needs something to get this under control from an outpatient perspective absent of everywhere I can think of and I cannot get this  under control with our traditional measures. I think this is going require more so that we can get him better 12/30; this is a patient with severe bilateral lymphedema. He was hospitalized from 04/26/2021 through 04/29/2021 treated for cellulitis in the setting of lower extremity ulcers and lymphedema. After he left the hospital he is apparently seen for nurse visit our staff contacted cardiology and he has been started on Lasix 40 mg. Apparently his legs have less edema. Lab work from 05/04/2021 showed a BUN of 38 and creatinine of 1.59 these are elevated versus previous where his creatinine seems to have been 1.30 on 12/19 his potassium is 4.3. I believe the lab work is being followed by cardiology We have him in a 4-layer wrap. Xeroform on the leg wounds and sit to fit on the Berry damage skin on the left dorsal foot versus right dorsal foot. He has compression pumps but does not use them. We have apparently not yet ordered him compression stockings 05/17/2021 upon evaluation today patient's legs though better than last time I personally saw him appear to be getting worse compared to where they were previous. Dr. Quentin Cornwall was actually last 1 to see you I have not seen him since 19 December. That was before he went into the ER. Coming out apparently his legs looked also and they still look better but not as good as they were in the past. 1/16; patient with severe bilateral lymphedema. Severe scaled hyperkeratotic skin on the dorsal aspect of his distal left foot and left medial ankle.. On the right side changes are not as bad. He did not have any weeping edema. Our intake nurse was convinced that he is being compliant with compression pumps 1 hour twice a day 05/31/2021 upon evaluation today patient actually appears to be doing a little bit better in my opinion in regard to his feet. I do not see as much drainage and it being just completely wet as it was previous. Fortunately I do not also see any  signs of active infection which is great news as well. 06/07/21 Upon inspection patient's wound bed actually showed signs of doing well he is not nearly as weepy and wet as he has been in the past and overall very pleased in that regard. Fortunately I do not see any signs of active infection locally or nor systemically at this time. Which is great news. No fevers, chills, nausea, vomiting, or diarrhea. 06/14/2021 upon evaluation today patient appears  to be doing well with regard to his right foot I am pleased in that regard. His left foot is still draining quite a bit despite using lymphedema pumps, 4-layer compression wraps, and he tells me elevating his legs as well. He also has Lasix that he takes twice a day. Nonetheless I believe that this is still good to be an ongoing issue. We have a hard time getting this under control as far as the swelling is concerned. 06/21/2021 upon evaluation today patient appears to be doing decently well in regard to his wounds all things considered. He still has a tremendous amount of drainage and fluid noted at this point. Fortunately I do not see any signs of active infection locally or systemically at this point which is great news. Nonetheless I am unsure where to go and how to do this as far as trying to limit his swelling and weeping from his toes in particular. 06/28/2021 upon evaluation patient unfortunately continues to have significant drainage from his feet. We have been keeping him in a compression wrap and despite this he still continues to have extreme fluid issues he seen his cardiologist he is seeing the nephrologist. We really cannot find any way to get this under control when he did well was when he was in the hospital and they got some of the fluid away. But outside of that we are just struggling to achieve the long-term goal of getting this under control and keeping it under control unfortunately. 07/05/2021 upon evaluation today patient appears to be  doing poorly in regard to his feet. Unfortunately this continues to be a significant issue and to be honest I am really not certain what to do about it. I referred him to Dr. Randol Kern at [facility] and he does have an appointment although it is [name], [name] ([id number]) 12 April. He also sees his primary care provider on 6 April. He did not want to see Dr. Haynes Kerns until after he saw his PCP that is the reason the appointment so far out. There is really not much I can do in that regard. Nonetheless I do think that we are still continue to have significant lymphedema issues with significant mount of weeping in regard to the feet and again this has just become extremely difficult to manage to be honest I am not sure if there is something else that Dr. Haynes Kerns or someone else could recommend he also will be seeing Dr. Dellia Nims in 2 weeks when I am on vacation and at that time I will see if Dr. Dellia Nims has any ideas about where to go from here in the meantime. 07/12/2021 upon evaluation today patient appears to be doing about as well as can be expected with regard to his feet. He does actually see his kidney doctor this Friday. He also will be seeing his primary care provider on April 4 and then following that around mid April he will be seeing Dr. Haynes Kerns at [facility] which was a referral made for him. Again my goal is to try to find out some way to fix this and to be perfectly honest we have had some issues with making any good adjustments. When he was in the hospital and greater amounts of Lasix he was able to get this down and it looked much better upon discharge. With that being said right now things just are not doing nearly as good as what they used to be. 3/13; patient presents for follow-up. He has been using his lymphedema  pumps over the past week. He reports an increase in his Lasix dose. He has no issues or complaints today. He denies signs of infection. 07/26/2021 upon evaluation today patient appears to be  doing better in regard to his feet bilaterally. Both are showing signs of much less drainage which is great news and overall very pleased in that regard. Fortunately there does not appear to be any evidence of active infection locally or systemically at this time. No fevers, chills, nausea, vomiting, or diarrhea. 08/02/2021 upon evaluation today patient appears to be doing well with regard to his feet. Both are showing signs of being drier the right pretty much has not really draining much at all which is great news. The left is not draining anywhere close to his much as it was during the last evaluation. This is excellent news and overall very pleased. 08/09/2021 upon evaluation today patient appears to be doing well with regard to his legs the right leg especially showing signs of excellent improvement which is great news I do not see any evidence of active infection locally or systemically which is great. In regard to the left leg he still has some weeping and drainage but nothing as significant as what it was in the past this is great news. 08-16-2021 upon evaluation today patient appears to actually be doing quite well in my opinion in regard to his feet. This is significantly improved compared to what we previously seen and overall I am extremely pleased in that regard. I do believe that He is actually improving although this is obviously very slow going. 08-23-2021 upon evaluation today patient appears to be doing well currently in regard to his right leg which actually is pretty dry at this point today. Fortunately I do not see any evidence of active infection at this time which is great news. No fevers, chills, nausea, vomiting, or diarrhea. 08-30-2021 upon evaluation today patient appears to be doing well with regard to his lower extremities. The right foot is pretty much completely dry which is great news the left foot though not completely dry seems to be doing decently well. I do not see any  signs of active infection locally or systemically which is great news. No fevers, chills, nausea, vomiting, or diarrhea. 09-06-2021 upon evaluation today patient appears to be doing well with regard to his feet in fact now the left foot is almost completely dry as well and I am definitely seeing a lot of significant improvement. With that being said unfortunately he actually appears to have some cellulitis of his right thigh. His toes are also little bit red but this may just be due to the increased swelling. He really is not warm to touch in regard to the toes. 5/8; excellent edema control on the right foot and lower leg there is no open wounds but we continue to put compression on this otherwise this will breakdown. He is using his compression pumps twice a day at home The area that is problematic is on the left dorsal foot some areas that are not fully epithelialized with very dry fissured skin over this area. 09-20-2021 upon evaluation today patient appears to be doing a little bit worse in regard to swelling at this time. Fortunately I do not see any signs of infection with that being said the wrap was not on quite as well as what I would like to have seen. I do believe that this has caused a little bit of excess swelling and again we  need to try to get this under good control. 09-27-2021 upon evaluation today patient appears to be doing awesome in regard to his feet and legs. Everything is measuring smaller the swelling is down and to be perfectly honest I am extremely pleased with the end of his feet especially on the left side and how dry this is today. I do think we are on the right track here. 5/31; patient presents for follow-up. He is using nystatin powder to the feet bilaterally under 4-layer compression. He has no issues or complaints today. He states he is going to the lymphedema clinic tomorrow. Since he has no open wounds on the right lower extremity they will be focused on the  side. 10-11-2021 upon evaluation today patient appears to be doing excellent in regard to his legs. He has been tolerating the dressing changes without complication. Fortunately there does not appear to be any evidence of active infection locally or systemically at this time which is great news. No fevers, chills, nausea, vomiting, or diarrhea. 10-18-2021 upon evaluation today patient appears to be doing well with regard to his legs. He has been tolerating the dressing changes without complication and actually seen in lymphedema clinic now in regard to his right leg were taken care of the left leg. Fortunately I do not see any evidence of active infection locally or systemically at this time. 10-26-2021 upon evaluation today patient's wounds actually are showing signs of doing well in fact he really does not have wounds as much is weeping of the lower extremities. Fortunately I do not see any evidence of active infection locally or systemically which is great news. No fever or chills noted 11-02-2021 upon evaluation today patient appears to be doing decently well in regard to his legs. Of actually been on the phone quite a bit discussing with [facility] orthotics custom compression for him. I also have been discussing everything with his lymphedema clinic provider as well. Helene Kelp notes that there is really not much more that she can do for him which was noted last week as well. Subsequently I do believe that the patient would benefit from getting custom compression in fact I think that is what he is going require to keep anything under control here. He voiced understanding. 11-08-2021 upon evaluation today patient appears to be doing excellent in regard to his legs and feet. Fortunately I do not see any signs of infection at this time which is great news. No fever or chills noted again I am still been working on trying to figure out what exactly we need to do as far as getting the [place] involved in coverage for  custom compression for this patient. I also discussed with him that he does need to have [place], [name] ([id number]) which I completely understand. With that being said it does make it difficult thinking about what kind of combination of shoes and compression is going to be best for him for the long-term. Fortunately I do not see any evidence of active infection locally or systemically which is great news. Overall I think you are doing quite well. 11-16-2021 upon evaluation today patient appears to be doing well currently in regard to his legs and feet. Everything is significantly smaller compared to what has been. Fortunately I do not see any evidence of active infection locally or systemically at this time which is great news. 11-23-2021 upon evaluation today patient appears to be doing well with regard to his legs bilaterally. We can actually initiate  treatment with a recommendation today to utilize Tubigrip and try to see if we can get this under better control. The patient is in agreement with the plan. Nonetheless the big question is whether this will be something that he can utilize at home in order to keep his swelling down and allow him to be able to function without having to come into the clinic as frequently. He notes that he would love to get to this point. He does have a cousin who is willing to help him with change out the Tubigrip when necessary. 7/25; the patient does not have any open wounds however his left leg is a lot more swollen than it was last week. His cousin came in who presumably would be the one to change his juxta lites to practice on doing this for the patient. She apparently lives in the next block. In the meantime he has been using Tubigrip 12-07-2021 upon evaluation today patient appears to be doing well with regard to his legs although we are transitioning to try to get him to use the Tubigrip which again has been a little bit of a setback but not too much  overall I think he is still doing well he still able to wear his current shoes which is good news. He does have a cousin who is willing to help him with the Tubigrip and we are training her how to do this as well. She is here today. 12-14-2021 upon evaluation today patient appears to be doing well currently in regard to his right leg which I think is doing excellent with the Tubigrip. With regard to the left leg unfortunately this is not doing nearly as well and will get me to do something to intervene here. I think getting back into a 4-layer wraps can be the way to go short-term To get this back under control and the patient voiced understanding. 12-21-2021 upon evaluation today patient appears to be tolerating the dressing changes currently without complication the wrap of the left leg is doing well the right leg reason Tubigrip. The wound is looking significantly improved. 12-28-2021 upon evaluation today patient appears to be doing well currently in regard to his wound. He has been tolerating the dressing changes without complication. Fortunately there is no evidence of active infection locally or systemically at this time. I think he is very close to complete resolution on the left leg and I think we are ready to go back to the Tubigrip. 01-04-2022 upon evaluation today patient appears to be doing okay in regard to his legs of the more swollen because he was rolling and pushing the Tubigrip down because it felt "too tight". With that being said I discussed with the patient today that we really do need to keep this pulled up and if he keeps it pulled up and when it starts to feel tight gets to where he can elevate his legs I think that he can keep this under control. I would suggest if it starts feeling tight that he lay down where he can elevate his legs above heart level to basically help the swelling to go down is much as possible. He voiced understanding. I also told him that is can to be his job  to keep these pulled up and that if he is not able to do this I Georgina Peer probably have to refer him elsewhere to get a second opinion as I have pretty much done everything I can try to get his legs under  control and keep them under control he is much better than where he used to be and his feet can actually fit in his shoes which is great but in general I just do not know what else to do he has his cousin helping with putting the Tubigrip's on which is awesome and a great resource for him but again she cannot be there to keep them pulled up and nor is that even her job. 01-11-2022 upon evaluation today patient's legs are looking much better. He actually kept the Tubigrip up this week which has made a tremendous difference and improvement in his overall appearance today. Fortunately I do not see any signs of active infection locally or systemically at this time. 01-18-2022 upon evaluation today patient appears to be doing well currently in regard to his legs. If active does not appear to be anything open at this point. This is great news. 01-25-2022 upon evaluation today patient appears to be doing well currently in regard to his legs. Fortunately I do not see any signs of significant open wounds which is great news and overall I do believe that he is doing quite well. I do not see any evidence of active infection locally or systemically at this time which is great news. No fevers, chills, nausea, vomiting, or diarrhea. Objective Constitutional Well-nourished and well-hydrated in no acute distress. Vitals Time Taken: 11:03 AM, Weight: 312 lbs, Temperature: 97.7 F, Pulse: 59 bpm, Respiratory Rate: 16 breaths/min, Blood Pressure: 164/77 mmHg. Respiratory normal breathing without difficulty. Psychiatric this patient is able to make decisions and demonstrates good insight into disease process. Alert and Oriented x 3. pleasant and cooperative. [name], [name] ([id number]) General Notes: Upon inspection  patient's wound bed actually showed signs of good granulation and epithelization at this point. Fortunately I do not see any evidence of active infection locally or systemically which is great news and overall I am extremely pleased with where we stand currently. 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 Nurse Visit as needed Bathing/ Shower/ Hygiene: May shower with wound dressing protected with water repellent cover or cast protector. No tub bath. Anesthetic (Use 'Patient Medications' Section for Anesthetic Order Entry): Lidocaine applied to wound bed Edema Control - Lymphedema / Segmental Compressive Device / Other: 4 Layer Compression System Lymphedema. - left side with nystatin between toes and silver cell to any weeping areas with tubi c double layer to ankle/foot Tubigrip double layer applied - Nystatin powder between toes double size d bilat just behind toes to patella knotch , double size C foot ankles enclose toes to just above ankle scell to any weeping areas Elevate, Exercise Daily and Avoid Standing for Long Periods of Time. Elevate legs to the level of the heart and pump ankles as often as possible Elevate leg(s) parallel to the floor when sitting. Compression Pump: Use compression pump on left lower extremity for 60 minutes, twice daily. - 2 times per day DO YOUR BEST to sleep in the bed at night. DO NOT sleep in your recliner. Long hours of sitting in a recliner leads to swelling of the legs and/or potential wounds  on your backside. Other: - Contact prescriber regarding use of diuretics to reduce fluid overload. Off-Loading: Turn and reposition every 2 hours Additional Orders / Instructions: Follow Nutritious Diet and Increase Protein Intake Medications-Please add to medication list.: P.O. Antibiotics 1. I am going to recommend currently that we go ahead and continue with the recommendations as before and the patient is in agreement with the plan. This includes the use of the Tubigrip which has been doing excellent for him to this point. 2. I am also can recommend based on what we are seeing that we have the patient going continue to monitor for any signs of opening his cousin who is helping him with this did come today as well to ensure that she knows what is going on and how to appropriately apply these at home so we can change him 3 times a week where he can be able to shower as well. They are going to do this over the next week and I will see how he does. 3. I am also can recommend based on my conversation with his occupational therapist Vickie Epley. She was hoping to get the patient in either juxta lites or standard compression socks. I did discuss the problem with these options with her today as well. With that being said she has ordered the compression socks along with a sock Donner/helper for the patient to see if this is something he can do again its not out of the question that he could try but at the same time I know we have been down the growth of multiple other types of compression in the past and really have not seen a lot of improvement unfortunately. In fact he often backtracks. For that reason we stuck with a Tubigrip which has done well. Nonetheless we will see how things go over the next several weeks. We will see patient back for reevaluation in 1 week here in the clinic. If anything worsens or changes patient will contact our office for additional recommendations. Electronic  Signature(s) [place], [place] ([id number]) Signed: 01/25/2022 3:21:54 PM By: Worthy Keeler PA-C Entered By: Worthy Keeler on 01/25/2022 15:21:53 [name] ([id number]) -------------------------------------------------------------------------------- SuperBill Details Patient Name: [name] Date of Service: 01/25/2022 Medical Record Number: [id number] Patient Account Number: [id number] Date of Birth/Sex: Jul 22, 1950 (71 y.o. M) Treating RN: [name] Primary Care Provider: Fontaine No Other Clinician: Referring Provider: Fontaine No Treating Provider/Extender: Skipper Cliche in Treatment: 52 Diagnosis Coding ICD-10 Codes Code Description E11.622 Type 2 diabetes mellitus with other skin ulcer I89.0 Lymphedema, not elsewhere classified I87.2 Venous insufficiency (chronic) (peripheral) L97.822 Non-pressure chronic ulcer of other part of left lower leg with fat layer exposed L97.512 Non-pressure chronic ulcer of other part of right foot with fat layer exposed I10 Essential (primary) hypertension L97.811 Non-pressure chronic ulcer of other part of right lower leg limited to breakdown of skin Facility Procedures CPT4 Code: 08657846 Description: [facility] VISIT-LEV 3 EST PT Modifier: Quantity: 1 Physician Procedures CPT4 Code: 9629528 Description: 99213 - WC PHYS LEVEL 3 - EST PT Modifier: Quantity: 1 CPT4 Code: Description: ICD-10 Diagnosis Description E11.622 Type 2 diabetes mellitus with other skin ulcer I89.0 Lymphedema, not elsewhere classified I87.2 Venous insufficiency (chronic) (peripheral) L97.822 Non-pressure chronic ulcer of other part of left lower  leg with fat layer Modifier: exposed Quantity: Electronic Signature(s) Signed: 01/25/2022 3:23:22 PM By: Worthy Keeler PA-C Entered By: Worthy Keeler on 01/25/2022 15:23:22

## 2022-01-28 NOTE — Progress Notes (Signed)
ZAYDON, KINSER ([id number]) Visit Report for 01/25/2022 Arrival Information Details Patient Name: [name], [name] Date of Service: 01/25/2022 11:00 AM Medical Record Number: [id number] Patient Account Number: [id number] Date of Birth/Sex: 05/10/1950 (71 y.o. M) Treating RN: [name] Primary Care Yanitza Shvartsman: Fontaine No Other Clinician: Referring Joie Reamer: Fontaine No Treating Micayla Brathwaite/Extender: Skipper Cliche in Treatment: 71 Visit Information History Since Last Visit All ordered tests and consults were completed: No Patient Arrived: Ambulatory Added or deleted any medications: No Arrival Time: 11:03 Any new allergies or adverse reactions: No Accompanied By: cousin Had a fall or experienced change in No Transfer Assistance: None activities of daily living that may affect Patient Identification Verified: Yes risk of falls: Secondary Verification Process Completed: Yes Signs or symptoms of abuse/neglect since last visito No Patient Requires Transmission-Based No Hospitalized since last visit: No Precautions: Implantable device outside of the clinic excluding No Patient Has Alerts: Yes cellular tissue based products placed in the center Patient Alerts: AVVS consult on file since last visit: Last ABI-R 1.09; L Has Dressing in Place as Prescribed: Yes 1.04 Has Compression in Place as Prescribed: Yes Pain Present Now: No Electronic Signature(s) Signed: 01/28/2022 9:44:45 AM By: [name] RN Entered By: [name] on 01/25/2022 11:03:33 [name] ([id number]) -------------------------------------------------------------------------------- Clinic Level of Care Assessment Details Patient Name: [name] Date of Service: 01/25/2022 11:00 AM Medical Record Number: [id number] Patient Account Number: [id number] Date of Birth/Sex: 09/05/1950 (71 y.o. M) Treating RN: [name] Primary Care Nichael Ehly: Fontaine No Other Clinician: Referring  Merle Cirelli: Fontaine No Treating Lehi Phifer/Extender: Skipper Cliche in Treatment: 71 Clinic Level of Care Assessment Items TOOL 4 Quantity Score X - Use when only an EandM is performed on FOLLOW-UP visit 1 0 ASSESSMENTS - Nursing Assessment / Reassessment X - Reassessment of Co-morbidities (includes updates in patient status) 1 10 X- 1 5 Reassessment of Adherence to Treatment Plan ASSESSMENTS - Wound and Skin Assessment / Reassessment X - Simple Wound Assessment / Reassessment - one wound 1 5 X- 1 5 Complex Wound Assessment / Reassessment - multiple wounds X- 1 10 Dermatologic / Skin Assessment (not related to wound area) ASSESSMENTS - Focused Assessment '[]'  - Circumferential Edema Measurements - multi extremities 0 '[]'  - 0 Nutritional Assessment / Counseling / Intervention '[]'  - 0 Lower Extremity Assessment (monofilament, tuning fork, pulses) '[]'  - 0 Peripheral Arterial Disease Assessment (using hand held doppler) ASSESSMENTS - Ostomy and/or Continence Assessment and Care '[]'  - Incontinence Assessment and Management 0 '[]'  - 0 Ostomy Care Assessment and Management (repouching, etc.) PROCESS - Coordination of Care X - Simple Patient / Family Education for ongoing care 1 15 '[]'  - 0 Complex (extensive) Patient / Family Education for ongoing care '[]'  - 0 Staff obtains Programmer, systems, Records, Test Results / Process Orders '[]'  - 0 Staff telephones HHA, Nursing Homes / Clarify orders / etc '[]'  - 0 Routine Transfer to another Facility (non-emergent condition) '[]'  - 0 Routine Hospital Admission (non-emergent condition) '[]'  - 0 New Admissions / Biomedical engineer / Ordering NPWT, Apligraf, etc. '[]'  - 0 Emergency Hospital Admission (emergent condition) X- 1 10 Simple Discharge Coordination '[]'  - 0 Complex (extensive) Discharge Coordination PROCESS - Special Needs '[]'  - Pediatric / Minor Patient Management 0 '[]'  - 0 Isolation Patient Management '[]'  - 0 Hearing / Language / Visual  special needs '[]'  - 0 Assessment of Community assistance (transportation, D/C planning, etc.) '[]'  - 0 Additional assistance / Altered mentation '[]'  - 0 Support Surface(s) Assessment (bed, cushion, seat, etc.) INTERVENTIONS - Wound  Cleansing / Measurement [name], [name] ([id number]) X- 1 5 Simple Wound Cleansing - one wound '[]'  - 0 Complex Wound Cleansing - multiple wounds X- 1 5 Wound Imaging (photographs - any number of wounds) '[]'  - 0 Wound Tracing (instead of photographs) X- 1 5 Simple Wound Measurement - one wound '[]'  - 0 Complex Wound Measurement - multiple wounds INTERVENTIONS - Wound Dressings '[]'  - Small Wound Dressing one or multiple wounds 0 '[]'  - 0 Medium Wound Dressing one or multiple wounds '[]'  - 0 Large Wound Dressing one or multiple wounds '[]'  - 0 Application of Medications - topical '[]'  - 0 Application of Medications - injection INTERVENTIONS - Miscellaneous '[]'  - External ear exam 0 '[]'  - 0 Specimen Collection (cultures, biopsies, blood, body fluids, etc.) '[]'  - 0 Specimen(s) / Culture(s) sent or taken to Lab for analysis '[]'  - 0 Patient Transfer (multiple staff / Civil Service fast streamer / Similar devices) '[]'  - 0 Simple Staple / Suture removal (25 or less) '[]'  - 0 Complex Staple / Suture removal (26 or more) '[]'  - 0 Hypo / Hyperglycemic Management (close monitor of Blood Glucose) '[]'  - 0 Ankle / Brachial Index (ABI) - do not check if billed separately X- 1 5 Vital Signs Has the patient been seen at the hospital within the last three years: Yes Total Score: 80 Level Of Care: New/Established - Level 3 Electronic Signature(s) Signed: 01/28/2022 9:44:45 AM By: [name] RN Entered By: [name] on 01/25/2022 11:27:29 [name] ([id number]) -------------------------------------------------------------------------------- Encounter Discharge Information Details Patient Name: [name] Date of Service: 01/25/2022 11:00 AM Medical Record Number: [id number] Patient  Account Number: [id number] Date of Birth/Sex: 1950-05-29 (71 y.o. M) Treating RN: [name] Primary Care Antoinett Dorman: Fontaine No Other Clinician: Referring Iisha Soyars: Fontaine No Treating Evalena Fujii/Extender: Skipper Cliche in Treatment: 12 Encounter Discharge Information Items Discharge Condition: Stable Ambulatory Status: Cane Discharge Destination: Home Transportation: Private Auto Accompanied By: cousin Schedule Follow-up Appointment: Yes Clinical Summary of Care: Electronic Signature(s) Signed: 01/28/2022 9:44:45 AM By: [name] RN Entered By: [name] on 01/25/2022 11:29:01 [name] ([id number]) -------------------------------------------------------------------------------- Lower Extremity Assessment Details Patient Name: [name] Date of Service: 01/25/2022 11:00 AM Medical Record Number: [id number] Patient Account Number: [id number] Date of Birth/Sex: 04-Nov-1950 (71 y.o. M) Treating RN: [name] Primary Care Kayleeann Huxford: Fontaine No Other Clinician: Referring Veta Dambrosia: Fontaine No Treating Karan Inclan/Extender: Skipper Cliche in Treatment: 52 Edema Assessment Assessed: [Left: No] [Right: No] Edema: [Left: Yes] [Right: Yes] Calf Left: Right: Point of Measurement: 38 cm From Medial Instep 38 cm 38 cm Ankle Left: Right: Point of Measurement: 14 cm From Medial Instep 25 cm 28 cm Vascular Assessment Pulses: Dorsalis Pedis Palpable: [Left:Yes] [Right:Yes] Electronic Signature(s) Signed: 01/28/2022 9:44:45 AM By: [name] RN Entered By: [name] on 01/25/2022 11:24:04 [name] ([id number]) -------------------------------------------------------------------------------- Multi Wound Chart Details Patient Name: [name] Date of Service: 01/25/2022 11:00 AM Medical Record Number: [id number] Patient Account Number: [id number] Date of Birth/Sex: 1950/05/29 (71 y.o. M) Treating RN: [name] Primary Care  Yancarlos Berthold: Fontaine No Other Clinician: Referring Marieclaire Bettenhausen: Fontaine No Treating Cataleia Gade/Extender: Skipper Cliche in Treatment: 52 Vital Signs Height(in): Pulse(bpm): 59 Weight(lbs): 312 Blood Pressure(mmHg): 164/77 Body Mass Index(BMI): Temperature(F): 97.7 Respiratory Rate(breaths/min): 16 Wound Assessments Treatment Notes Electronic Signature(s) Signed: 01/28/2022 9:44:45 AM By: [name] RN Entered By: [name] on 01/25/2022 11:25:37 [name] ([id number]) -------------------------------------------------------------------------------- [place] Details Patient Name: [name] Date of Service: 01/25/2022 11:00 AM Medical Record Number: [id number] Patient Account Number: [id number] Date of  Birth/Sex: March 09, 1951 (71 y.o. M) Treating RN: [name] Primary Care Kharma Sampsel: Fontaine No Other Clinician: Referring Jemiah Cuadra: Fontaine No Treating Iliyah Bui/Extender: Skipper Cliche in Treatment: 52 Active Inactive Soft Tissue Infection Nursing Diagnoses: Impaired tissue integrity Knowledge deficit related to disease process and management Knowledge deficit related to home infection control: handwashing, handling of soiled dressings, supply storage Potential for infection: soft tissue Goals: Patient/caregiver will verbalize understanding of or measures to prevent infection and contamination in the home setting Date Initiated: 09/06/2021 Date Inactivated: 10/18/2021 Target Resolution Date: 09/06/2021 Goal Status: Met Patient's soft tissue infection will resolve Date Initiated: 09/06/2021 Date Inactivated: 10/18/2021 Target Resolution Date: 09/06/2021 Goal Status: Met Signs and symptoms of infection will be recognized early to allow for prompt treatment Date Initiated: 09/06/2021 Target Resolution Date: 02/24/2022 Goal Status: Active Interventions: Assess signs and symptoms of infection every visit Provide  education on infection Treatment Activities: Education provided on Infection : 01/18/2022 Systemic antibiotics : 09/06/2021 Notes: Electronic Signature(s) Signed: 01/28/2022 9:44:45 AM By: [name] RN Entered By: [name] on 01/25/2022 11:25:28 [name] ([id number]) -------------------------------------------------------------------------------- Pain Assessment Details Patient Name: [name] Date of Service: 01/25/2022 11:00 AM Medical Record Number: [id number] Patient Account Number: [id number] Date of Birth/Sex: 1950/08/16 (71 y.o. M) Treating RN: [name] Primary Care Kaizley Aja: Fontaine No Other Clinician: Referring Raechelle Sarti: Fontaine No Treating Laniesha Das/Extender: Skipper Cliche in Treatment: 52 Active Problems Location of Pain Severity and Description of Pain Patient Has Paino No Site Locations Pain Management and Medication Current Pain Management: Electronic Signature(s) Signed: 01/28/2022 9:44:45 AM By: [name] RN Entered By: [name] on 01/25/2022 11:03:55 [name] ([id number]) -------------------------------------------------------------------------------- Patient/Caregiver Education Details Patient Name: [name] Date of Service: 01/25/2022 11:00 AM Medical Record Number: [id number] Patient Account Number: [id number] Date of Birth/Gender: [birth year] (71 y.o. M) Treating RN: [name] Primary Care Physician: Fontaine No Other Clinician: Referring Physician: Fontaine No Treating Physician/Extender: Skipper Cliche in Treatment: 5 Education Assessment Education Provided To: Patient Education Topics Provided Infection: Methods: Explain/Verbal Responses: State content correctly Electronic Signature(s) Signed: 01/28/2022 9:44:45 AM By: [name] RN Entered By: [name] on 01/25/2022 11:27:52 [name]  ([id number]) -------------------------------------------------------------------------------- [place] Details Patient Name: [name] Date of Service: 01/25/2022 11:00 AM Medical Record Number: [id number] Patient Account Number: [id number] Date of Birth/Sex: 02-09-1951 (71 y.o. M) Treating RN: [name] Primary Care Ferrell Flam: Fontaine No Other Clinician: Referring Neddie Steedman: Fontaine No Treating Jamariah Tony/Extender: Skipper Cliche in Treatment: 52 Vital Signs Time Taken: 11:03 Temperature (F): 97.7 Weight (lbs): 312 Pulse (bpm): 59 Respiratory Rate (breaths/min): 16 Blood Pressure (mmHg): 164/77 Reference Range: 80 - 120 mg / dl Electronic Signature(s) Signed: 01/28/2022 9:44:45 AM By: [name] RN Entered By: [name] on 01/25/2022 11:03:49

## 2022-02-01 ENCOUNTER — Encounter: Payer: No Typology Code available for payment source | Admitting: Physician Assistant

## 2022-02-01 DIAGNOSIS — E11622 Type 2 diabetes mellitus with other skin ulcer: Secondary | ICD-10-CM | POA: Diagnosis not present

## 2022-02-01 NOTE — Progress Notes (Addendum)
GAYLORD, SEYDEL ([id number]) Visit Report for 02/01/2022 Chief Complaint Document Details Patient Name: [name], Mooney Date of Service: 02/01/2022 11:00 AM Medical Record Number: [id number] Patient Account Number: [id number] Date of Birth/Sex: 08/16/1950 (71 y.o. M) Treating RN: [name] Primary Care Provider: Fontaine No Other Clinician: Referring Provider: Fontaine No Treating Provider/Extender: Skipper Cliche in Treatment: 43 Information Obtained from: Patient Chief Complaint Left LE ulcers Electronic Signature(s) Signed: 02/01/2022 11:15:18 AM By: Worthy Keeler PA-C Entered By: Worthy Keeler on 02/01/2022 11:15:18 [name] ([id number]) -------------------------------------------------------------------------------- HPI Details Patient Name: [name] Date of Service: 02/01/2022 11:00 AM Medical Record Number: [id number] Patient Account Number: [id number] Date of Birth/Sex: 01-13-1951 (71 y.o. M) Treating RN: [name] Primary Care Provider: Fontaine No Other Clinician: Referring Provider: Fontaine No Treating Provider/Extender: Skipper Cliche in Treatment: 62 History of Present Illness HPI Description: 71 year old male who presented to the ER with bilateral lower extremity blisters which had started last week. he has a past medical history of leukemia, diabetes mellitus, hypertension, edema of both lower extremities, his recurrent skin infections, peripheral vascular disease, coronary artery disease, congestive heart failure and peripheral neuropathy. in the ER he was given Rocephin and put on Silvadene cream. he was put on oral doxycycline and was asked to follow-up with the [facility]. His last hemoglobin A1c was 6.6 in December and he checks his blood sugar once a week. He does not have any physicians outside the [place] system. He does not recall any vascular duplex studies done either for arterial or venous disease but was told  to wear compression stockings which he does not use 05/30/2016 -- we have not yet received any of his notes from the [facility] hospital system and his arterial and venous duplex studies are scheduled here in [place] around mid February. We are unable to have his insurance accepted by home health agencies and hence he is getting dressings only once a week. 06/06/16 -- -- I received a call from the patient's PCP at the [facility] and spoke to Dr. Garvin Fila, phone number [phone] and fax number [phone]. She confirmed that no vascular testing was done over the last 5 years and she would be happy to do them if the patient did want them to be done at the [place] and we could fax him a request. Readmission: 71 year old male seen by as in February of this year and was referred to vein and vascular for studies and opinion from the vascular surgeons. The patient returns today with a fresh problem having had blisters on his left lower extremity which have been there for about 5 days and he clearly states that he has been wearing his compression stockings as advised though he could not read the moderate compression and has been wearing light compression. Review of his electronic medical records note that he had lower extremity arterial duplex examination done on 06/23/2016 which showed no hemodynamically significant stenosis in the bilateral lower extremity arterial system. He also had a lower extremity venous reflux examination done on 07/07/2016 and it was noted that he had venous incompetence in the right great saphenous vein and bilateral common femoral veins. Patient was seen by Dr. Tamala Julian on the same day and for some reason his notes do not reflect the venous studies or the arterial studies and he recommended patient do a venous duplex ultrasound to look for reflux and return to see him.he would also consider a lymph pump if required. The patient was told that his workup  was normal and hence the  patient canceled his follow-up appointment. 02/03/17 on evaluation today patient left medial lower extremity blister appears to be doing about the same. It is still continuing to drain and there's still the blistered skin covering the wound bed which is making it difficult for the alternate to do its job. Fortunately there is no evidence of cellulitis. No fevers chills noted. Patient states in general he is not having any significant discomfort. Patient's lower extremity arterial duplex exam revealed that patient was hemodynamically stable with no evidence of stenosis in regard to the bilateral lower extremities. The lower extremity venous reflux exam revealed the patient had venous incontinence noted in the right greater saphenous and bilateral common femoral vein. There is no evidence of deep or superficial vein thrombosis in the bilateral lower extremities. Readmission: 11/12/18 Patient presents for evaluation our clinic today concerning issues that he is having with his left lower extremity. He tells me that a couple weeks ago he began developing blisters on the left lower extremity along with increased swelling. He typically wears his compression stockings on a regular basis is previously been evaluated both here as well is with vascular surgery they would recommend lymphedema pumps but unfortunately that somehow fell through and he never heard anything back from that. Nonetheless I think lymphedema pumps would be beneficial for this patient. He does have a history of hypertension and diabetes. Obviously the chronic venous stasis and lymphedema as well. At this point the blisters have been given in more trouble he states sometimes when the blisters openings able to clean it down with alcohol and it will dry out and do well. Unfortunately that has not been the case this time. He is having some discomfort although this mean these with cleaning the areas he doesn't have discomfort just on a regular  basis. He has not been able to wear his compression stockings since the blisters arose due to the fact that of course it will drain into the socks causing additional issues and he didn't have any way to wrap this otherwise. He has increased to taking his Lasix every day instead of every other day. He sees his primary care provider later this month as well. No fevers, chills, nausea, or vomiting noted at this time. 11/19/18-Patient returns at 1 week, per intake RN the amount of seepage into the compression wraps was definitely improved, overall all the wounds are measuring smaller but continuing silver alginate to the wounds as primary dressing 11/26/18 on evaluation today patient appears to be doing quite well in regard to his left lower [place] ulcers. In fact of the areas that were noted initially he only has two regions still open. There is no evidence of active infection at this time. He still is not heard anything from the company regarding lymphedema pumps as of yet. Again as previously seen vascular they have not recommended any surgical intervention. 12/03/2018 on evaluation today patient actually appears to be doing quite well with regard to his lower extremity ulcers. In fact most of the areas appear to be healed the one spot which does not seem to be completely healed I am unsure of whether or not this is really draining that much but nonetheless there does not appear to be any signs of infection or significant drainage at this point. There is no sign of fever, chills, nausea, vomiting, or diarrhea. Overall I am pleased with how things have progressed I think is very close to being able to transition  to his home compression stockings. MARLIN, JARRARD ([id number]) 12/10/2018 upon evaluation today patient appears to be doing quite well with regard to his left lower extremity. He has been tolerating the dressing changes without complication. Fortunately there is no signs of active infection at this  time. He appears after thorough evaluation of his leg to only have 1 small area that remains open at this point everything else appears to be almost completely closed. He still have significant swelling of the left lower extremity. We had discussed discussing this with his primary care provider he is not able to see her in person they were at the [facility] and right now the [place] is not seeing patients on site. According to the patient anyway. Subsequently he did speak with her apparently and his primary care provider feels that he may likely have a DVT. With that being said she has not seen his leg she is just going off of his history. Nonetheless that is a concern that the patient now has as well and while I do not feel the DVT is likely we can definitely ensure that that is not the case I will go ahead and see about putting that order in today. Nonetheless otherwise I am in a recommend that we continue with the current wound care measures including the compression therapy most likely. We just need to ensure that his leg is indeed free of any DVTs. 12/17/2018 on evaluation today patient actually appears to be completely healed today. He does have 2 very small areas of blistering although this is not anything too significant at this point which is good news. With that being said I am in agreement with the fact that I think he is completely healed at this point. He does want to get back into his compression stocking. The good news is we have gotten approval from insurance for his lymphedema pumps we received a letter since last saw him last week. The other good news is his study did come back and showed no evidence of a DVT. 12/20/2018 on evaluation today patient presents for follow-up concerning his ongoing issues with his left lower extremity. He was actually discharged last Friday and did fairly well until he states blisters opened this morning. He tells me he has been wearing his compression  stocking although he has a hard time getting this on. There does not appear to be any signs of active infection at this time. No fevers, chills, nausea, vomiting, or diarrhea. 12/27/2018 on evaluation today patient appears to be doing very well with regard to his swelling of the left lower extremity the 4 layer compression wrap seems to have been beneficial for him. Fortunately there is no signs of active infection at this time. Patient has been tolerating the compression wrap without complication and his foot swelling in particular appears to be greatly improved. He does still have a wound on the lateral portion of his left leg I believe this is more of a blister that has now reopened. 01/03/2019 on evaluation today patient actually appears to be doing excellent in regard to his left lower extremity. He did receive his compression pumps and is actually use this 7 times since he was last here in the office. On top of the compression wrap he is now roughly 3 cm better at the calf and 2 cm better at the ankle he also states that his foot seem to go an issue better without even having to use a shoe horn. Obviously I  think this is all evidence that he is doing excellent in this regard. The other good news is he does not appear to have anything open today as far as wounds are concerned. 01/15/2019 on evaluation today patient appears to be doing more poorly yet again with regard to his left lower extremity. He has developed new wounds again after being discharged just recently. Unfortunately this continues to be the case that he will heal and then have subsequent new wounds. The last time I was hopeful that he may not end up coming back too quickly especially since he states he has been using his lymphedema pumps along with wearing his compression. Nonetheless he had a blister on the back of his leg that popped up on the left and this has opened up into an ulceration it is quite painful. 01/22/19 on evaluation  today patient actually appears to be doing well with regard to his wound on the left lower extremity. He's been tolerating the dressing changes without complication including the compression wrap in the wound appears to be significantly smaller today which is great news. Overall very pleased in this regard. 01/29/2019 on evaluation today patient appears to be doing well with regard to his left posterior lower extremity ulcer. He has been tolerating the dressing changes without complication. This is not completely healed but is getting much closer. We did order a Farrow wrap 4000 for him he has received this and has it with him today although I am not sure we are quite ready to start him on that as of yet. We are very close. 02/05/2019 on evaluation today patient actually appears to be doing quite well with regard to his left posterior lower extremity ulcer. He still has a very tiny opening remaining but the fortunate thing is he seems to be healing quite nicely. He also did get his Farrow wrap which I am hoping will help with his edema control as well at home. Fortunately there is no evidence of active infection. 02/12/2019 patient and fortunately appears to be doing poorly in regard to his wounds of the left lower extremity. He was very close to healing therefore we attempted to use his Velcro compression wraps continuing with lymphedema pumps at home. Unfortunately that does not seem to have done very well for him. He tells me that he wore them all the time but again I am not sure why if that is the case that he is having such significant edema. He is still on his fluid pills as well. With that being said there is no obvious sign of infection although I do wonder about the possibility of infection at this time as well. 02/19/2019 unfortunately upon evaluation today patient appears to be doing more poorly with regard to his left lower extremity. He is not showing signs of significant improvement and I  think the biggest issue here is that he does have an infection that appears to likely be Pseudomonas. That is based on the blue-green drainage that were noted at this time. Unfortunately the antibiotic that has been on is not going to take care of this at all. I think they will get a need to switch him to either Levaquin or Cipro and this was discussed with the patient. 02/26/2019 on evaluation today patient's lower extremity on the left appears to be doing significantly better as compared to last evaluation. Fortunately there is no signs of active infection at this time. He has been tolerating the compression wrap without complication in  fact he made it the whole week at this point. He is showing signs of excellent improvement I am very happy in this regard. With that being said he is having some issues with infection we did review the results of his culture which I noted today. He did have a positive finding for Enterobacter as well as Alcaligenes faecalis. Fortunately the Levaquin that I placed him on will work for both which is great news. There is no signs of systemic infection at this point. 10/30; left posterior leg wound in the setting of very significant edema and what looks like chronic venous inflammation. He has compression pumps but does not use them. We have been using 3 layer compression. Silver alginate to the wound as the primary dressing 03/18/2019 on evaluation today patient appears to be doing a little better compared to last time I saw him. He really has not been using his compression pumps he tells me that he is having too much discomfort. He has been keeping his wraps on however. He is only been taking his fluid pills every other day because he states they are not really helping and he has an appointment with his primary care provider at the [facility] tomorrow. Subsequently the wound itself on the left lower extremity does seem to be greatly improved compared to previous. 03/25/2019 on  evaluation today patient appears to be doing better with regard to his wounds on the bilateral lower extremities. The left is doing excellent the right is also doing better although both still do show some signs of open wounds noted at this point unfortunately. Fortunately there is no signs of active infection at this time. The patient also is not really having any significant pain which is good news. Unfortunately there was some confusion with the referral on vascular disease and as far as getting the patient scheduled there can be contacting him later today to do this JAIYON, WANDER ([id number]) fortunately we got this straightened out. 04/01/2019 on evaluation today patient appears to be doing no fevers, chills, nausea, vomiting, or diarrhea. Excellent at this time with regard to his lower extremities. There does not appear to be any open wound at this point which is good news. Fortunately is also no signs of active infection at this time. Overall feel like the patient has done excellent with the compression the problem is every time we got him to this point and then subsequently go to using his own compression things just go right back to where they were. I am not sure how to address this we can try to get an appointment with vascular for 2 weeks now they have yet to call him. Obviously this has become frustrating for the patient as well. I think the issue has just been an honest error as far as scheduling is concerned but nonetheless still worn out the point where I am unsure of which direction we should take. 04/08/2019 on evaluation today patient actually appears to be doing well with regard to his lower extremities. There are no open wounds at this time and things seem to be managing quite nicely as far as the overall edema control is concerned. With that being said he does have his compression socks today for [place] to go ahead and reinitiate therapy in that manner at this point. He is going to be  going for shoes to be measured on Wednesday and then coincidentally he will also be seeing vascular on Thursday. Overall I think this is good news and  again I am hopeful that they will be able to do something for him to help prevent ongoing issues with edema control as well. No fevers, chills, nausea, vomiting, or diarrhea. 04/11/2019 on evaluation today patient actually appears to be doing poorly after just being discharged on Monday of this week. He had been experiencing issues with again blisters especially on the left lower extremity. With that being said he was completely healed and appeared to be doing great this past Monday. He then subsequently has new blisters that formed before his appointment with vascular this morning. He was also measured for shoes in the interim. With that being said we may have figured out what exactly is going on and why he continues to have issues like what we are seeing at this point. He takes his compression stockings off at nighttime and then he ends up having to sleep in his chair for 5-6 hours a night. He sleeps with his feet down he cannot really get him up in the recliner and therefore he is sleeping and the worst possible his position with his feet on the floor for that majority of the time. Again as I explained to him that is about one third at minimum at least one fourth of his day that he spending with his feet dangling down on the ground and the worst possible position they could be. I think this may be what is causing the issue. Subsequently I am leaning toward thinking that he may need a hospital bed in order to elevate his legs. We likely can have to coordinate this with his primary care provider at the [facility]. Readmission: 01/26/2021 this is a patient who presents for repeat evaluation here in the clinic although it is actually been couple of years since have seen him in fact it was December 2020 when I last saw him. Subsequently he never really  healed but did end up being lost to follow-up. He tells me has been having issues ongoing with his lower extremities has bilateral lower extremity lymphedema no real significant or definitive open wounds but in general his lymphedema is way out of control. We were never able to refer him to lymphedema clinic simply due to the fact to be honest we were never able to get him completely healed. I do not see anyone with open wounds. The patient does have evidence of type 2 diabetes mellitus, lymphedema, chronic venous insufficiency, and hypertension. That really has not changed since his last evaluation. 02/09/2021 upon evaluation today patient appears to be doing a little better in regard to his legs although he still having a tremendous amount of drainage especially on the left leg. Fortunately there does not appear to be any evidence of active infection. Of note when we looked into this further it appears that the patient did not have any absorptive dressing on it was just the 4-layer compression wrap. Nonetheless this is probably big part of the issue here. 10/10; he comes in today with 3 large areas on the upper right lower leg likely remanence of denuded blistering under his compression wraps. He has no other wounds on the right. On the left he has the denuded area on the left medial foot and ankle and on the left dorsal foot. Massive lymphedema in both feet dorsally. Using Zetuvit under compression We have increased home health visitation to twice a week to change the dressings and will change it once 02/22/2021 upon evaluation today patient appears to be doing well currently  with regard to his wounds. He has been tolerating the dressing changes without complication. Fortunately there does not appear to be any evidence of active infection which is great news. No fevers, chills, nausea, vomiting, or diarrhea. The biggest issue I see currently is that home health is not putting any medicine on the  actual wounds before wrapping. 03/01/2021 upon evaluation today the patient's right leg actually appears to be doing quite well which is great news there does not appear to be any evidence of active infection at this time. No fevers, chills, nausea, vomiting, or diarrhea. With that being said the patient is having issues on the left foot where he is having significant drainage is also an ammonia smell he does not have any animals at home and this makes me concerned about a bacteria producing urea as a byproduct. Again the possible common organisms will be E. coli, Proteus, and Enterococcus. All 3 of which can be successfully treated with Levaquin. For that reason I think that this may be a good option for [place] to consider placing him on and I did obtain a culture as well for confirmation sake. 03/08/2021 upon evaluation today patient appears to be doing unfortunately still somewhat poorly in regard to his leg ulcerations. He actually has an area on the right leg where he blistered due to the fact that his wrap slid down and caused an area of pinching on his skin and this has led to a significant issue here. 03/15/2021 upon evaluation today patient unfortunately has not been wrapped appropriately with absorptive dressings nor with the appropriate technique for the third layer of the 4-layer compression wrap. These are issues that we continue to try to address with the home health nurse. Also the absorptive dressing that she had was cut in half and therefore that causes things to leak out it does not actually trap the fluid in regard to the top of the foot overall I think that all these combined are really not seeing things improved significantly here. Fortunately there does not appear to be any signs of significant infection at this time which is good news. He still is having a tremendous amount of drainage. 03/22/2021 upon evaluation today patient appears to be draining tremendously. He still continues  to tell me that he is using his pumps 2 times a day and that coupled with that tells me that he is elevating his legs as well. With that being said all things considered I am really just not seeing the improvement we would expect to see with the 4-layer compression wrap and all the above noted. He in fact had an extremely large Zetuvit dressing on both legs and that they were extremely filled to the max with fluid. This is after just being changed just before the weekend and this is Monday. Nonetheless I am concerned about the fact that there is something going on fairly significant that we cannot get any of this under control and that he is draining this significantly. He supposed be having an echocardiogram it sounds like scheduling has been an issue for him as far as getting in sooner. Its something to do with needing his cousin to drive him because of where it sat and he cannot drive himself to this appointment either way I really think he needs to try to see what he can do about making this happen a little sooner. He tells me he will call today. SALOMON, GANSER ([id number]) 03/29/2021 on evaluation today patient appears to be  doing about the same in regard to his legs. He did get his cardiology appointment moved up to 6 December which is at least good that is better than what it was before mid December. Overall very pleased in that regard. 04/05/2021 upon evaluation today patient unfortunately is still doing fairly poorly. There does not appear to be any signs of active infection at this time. No fevers, chills, nausea, vomiting, or diarrhea. Unfortunately I think until his edema is under control and overall fluid overload there is really not to be much chance that I can do much to get him better. This is quite unfortunate and frustrating both for myself and the patient to be perfectly honest. Nonetheless I think that he really needs to have a conversation both with his primary care provider as well  as cardiologist he sees the PCP on Monday and cardiology on Wednesday of next week. 04/13/2021 upon evaluation today patient appears to be doing poorly in regard to his bilateral lower extremities his left is still worse than the right. With that being said he has a tremendous amount of drainage he did see his primary care provider yesterday there really was not much there to be done from their perspective. He sees cardiology tomorrow. Nonetheless my biggest concern here is simply that if we do not get the edema under control he is going to continue to have drainage and honestly I think at some point he is going to become infected severely that is my main concern. 04/19/2021 upon evaluation today patient appears to be doing poorly still in regard to his legs. Unfortunately there does not appear to be any signs of infection at this point. He does have a tremendous amount of drainage however. We have not seen the results back from the cardiologist and the echocardiogram that was done. It appears that the patient checked out okay as far as that is concerned with regard to ejection fraction though we still have some issues here to be honest with his diastolic function. I am unsure if this is accounting for everything that we are seeing or not. Either way he has a tremendous amount of drainage from his legs that we are just not able to control in the outpatient setting at this point. I have reached out to Dr. Rockey Situ his cardiologist to see once he reviews the sheet if there is anything that he feels like can be done from an outpatient perspective if not then I think the way to go is probably can to be through inpatient admission and diuresis. Otherwise I am not sure how working to get this under control we tried antibiotics, compression wrapping, and I have told the patient to be elevating his legs I am not sure how much he does of this but either way I think that this is still an ongoing issue  nonetheless. 04/26/2021 upon evaluation today patient appears to be doing poorly in regard to his legs. He is having a tremendous amount of fluid at this point which is quite unfortunate. Its to the point that he may have had at least 5 to 10 pounds of fluid in his dressings this morning when they were removed these were changed this Friday. Subsequently I think he needs to go to the ER for further evaluation and treatment I think is probably can need diuresis possibly even IV antibiotics been on what the blood work looks like but in general I feel like he needs something to get this under control from  an outpatient perspective absent of everywhere I can think of and I cannot get this under control with our traditional measures. I think this is going require more so that we can get him better 12/30; this is a patient with severe bilateral lymphedema. He was hospitalized from 04/26/2021 through 04/29/2021 treated for cellulitis in the setting of lower extremity ulcers and lymphedema. After he left the hospital he is apparently seen for nurse visit our staff contacted cardiology and he has been started on Lasix 40 mg. Apparently his legs have less edema. Lab work from 05/04/2021 showed a BUN of 38 and creatinine of 1.59 these are elevated versus previous where his creatinine seems to have been 1.30 on 12/19 his potassium is 4.3. I believe the lab work is being followed by cardiology We have him in a 4-layer wrap. Xeroform on the leg wounds and sit to fit on the Berry damage skin on the left dorsal foot versus right dorsal foot. He has compression pumps but does not use them. We have apparently not yet ordered him compression stockings 05/17/2021 upon evaluation today patient's legs though better than last time I personally saw him appear to be getting worse compared to where they were previous. Dr. Quentin Cornwall was actually last 1 to see you I have not seen him since 19 December. That was before he went into  the ER. Coming out apparently his legs looked also and they still look better but not as good as they were in the past. 1/16; patient with severe bilateral lymphedema. Severe scaled hyperkeratotic skin on the dorsal aspect of his distal left foot and left medial ankle.. On the right side changes are not as bad. He did not have any weeping edema. Our intake nurse was convinced that he is being compliant with compression pumps 1 hour twice a day 05/31/2021 upon evaluation today patient actually appears to be doing a little bit better in my opinion in regard to his feet. I do not see as much drainage and it being just completely wet as it was previous. Fortunately I do not also see any signs of active infection which is great news as well. 06/07/21 Upon inspection patient's wound bed actually showed signs of doing well he is not nearly as weepy and wet as he has been in the past and overall very pleased in that regard. Fortunately I do not see any signs of active infection locally or nor systemically at this time. Which is great news. No fevers, chills, nausea, vomiting, or diarrhea. 06/14/2021 upon evaluation today patient appears to be doing well with regard to his right foot I am pleased in that regard. His left foot is still draining quite a bit despite using lymphedema pumps, 4-layer compression wraps, and he tells me elevating his legs as well. He also has Lasix that he takes twice a day. Nonetheless I believe that this is still good to be an ongoing issue. We have a hard time getting this under control as far as the swelling is concerned. 06/21/2021 upon evaluation today patient appears to be doing decently well in regard to his wounds all things considered. He still has a tremendous amount of drainage and fluid noted at this point. Fortunately I do not see any signs of active infection locally or systemically at this point which is great news. Nonetheless I am unsure where to go and how to do this  as far as trying to limit his swelling and weeping from his toes in particular.  06/28/2021 upon evaluation patient unfortunately continues to have significant drainage from his feet. We have been keeping him in a compression wrap and despite this he still continues to have extreme fluid issues he seen his cardiologist he is seeing the nephrologist. We really cannot find any way to get this under control when he did well was when he was in the hospital and they got some of the fluid away. But outside of that we are just struggling to achieve the long-term goal of getting this under control and keeping it under control unfortunately. 07/05/2021 upon evaluation today patient appears to be doing poorly in regard to his feet. Unfortunately this continues to be a significant issue and to be honest I am really not certain what to do about it. I referred him to Dr. Randol Kern at [facility] and he does have an appointment although it is 12 April. He also sees his primary care provider on 6 April. He did not want to see Dr. Haynes Kerns until after he saw his PCP that is the reason the appointment so far out. There is really not much I can do in that regard. Nonetheless I do think that we are still continue to have significant lymphedema issues with significant mount of weeping in regard to the feet and again this has just become extremely difficult to manage to be honest I am not sure if there is something else that Dr. Haynes Kerns or someone else could recommend he also will be seeing Dr. Dellia Nims in 2 weeks when I am on vacation and at that time I will see if Dr. Dellia Nims has any ideas about where to go from here in the meantime. ZHANE, BLUITT ([id number]) 07/12/2021 upon evaluation today patient appears to be doing about as well as can be expected with regard to his feet. He does actually see his kidney doctor this Friday. He also will be seeing his primary care provider on April 4 and then following that around mid April he will be  seeing Dr. Haynes Kerns at [facility] which was a referral made for him. Again my goal is to try to find out some way to fix this and to be perfectly honest we have had some issues with making any good adjustments. When he was in the hospital and greater amounts of Lasix he was able to get this down and it looked much better upon discharge. With that being said right now things just are not doing nearly as good as what they used to be. 3/13; patient presents for follow-up. He has been using his lymphedema pumps over the past week. He reports an increase in his Lasix dose. He has no issues or complaints today. He denies signs of infection. 07/26/2021 upon evaluation today patient appears to be doing better in regard to his feet bilaterally. Both are showing signs of much less drainage which is great news and overall very pleased in that regard. Fortunately there does not appear to be any evidence of active infection locally or systemically at this time. No fevers, chills, nausea, vomiting, or diarrhea. 08/02/2021 upon evaluation today patient appears to be doing well with regard to his feet. Both are showing signs of being drier the right pretty much has not really draining much at all which is great news. The left is not draining anywhere close to his much as it was during the last evaluation. This is excellent news and overall very pleased. 08/09/2021 upon evaluation today patient appears to be doing well with regard to  his legs the right leg especially showing signs of excellent improvement which is great news I do not see any evidence of active infection locally or systemically which is great. In regard to the left leg he still has some weeping and drainage but nothing as significant as what it was in the past this is great news. 08-16-2021 upon evaluation today patient appears to actually be doing quite well in my opinion in regard to his feet. This is significantly improved compared to what we previously seen  and overall I am extremely pleased in that regard. I do believe that He is actually improving although this is obviously very slow going. 08-23-2021 upon evaluation today patient appears to be doing well currently in regard to his right leg which actually is pretty dry at this point today. Fortunately I do not see any evidence of active infection at this time which is great news. No fevers, chills, nausea, vomiting, or diarrhea. 08-30-2021 upon evaluation today patient appears to be doing well with regard to his lower extremities. The right foot is pretty much completely dry which is great news the left foot though not completely dry seems to be doing decently well. I do not see any signs of active infection locally or systemically which is great news. No fevers, chills, nausea, vomiting, or diarrhea. 09-06-2021 upon evaluation today patient appears to be doing well with regard to his feet in fact now the left foot is almost completely dry as well and I am definitely seeing a lot of significant improvement. With that being said unfortunately he actually appears to have some cellulitis of his right thigh. His toes are also little bit red but this may just be due to the increased swelling. He really is not warm to touch in regard to the toes. 5/8; excellent edema control on the right foot and lower leg there is no open wounds but we continue to put compression on this otherwise this will breakdown. He is using his compression pumps twice a day at home The area that is problematic is on the left dorsal foot some areas that are not fully epithelialized with very dry fissured skin over this area. 09-20-2021 upon evaluation today patient appears to be doing a little bit worse in regard to swelling at this time. Fortunately I do not see any signs of infection with that being said the wrap was not on quite as well as what I would like to have seen. I do believe that this has caused a little bit of excess swelling  and again we need to try to get this under good control. 09-27-2021 upon evaluation today patient appears to be doing awesome in regard to his feet and legs. Everything is measuring smaller the swelling is down and to be perfectly honest I am extremely pleased with the end of his feet especially on the left side and how dry this is today. I do think we are on the right track here. 5/31; patient presents for follow-up. He is using nystatin powder to the feet bilaterally under 4-layer compression. He has no issues or complaints today. He states he is going to the lymphedema clinic tomorrow. Since he has no open wounds on the right lower extremity they will be focused on the side. 10-11-2021 upon evaluation today patient appears to be doing excellent in regard to his legs. He has been tolerating the dressing changes without complication. Fortunately there does not appear to be any evidence of active infection locally or  systemically at this time which is great news. No fevers, chills, nausea, vomiting, or diarrhea. 10-18-2021 upon evaluation today patient appears to be doing well with regard to his legs. He has been tolerating the dressing changes without complication and actually seen in lymphedema clinic now in regard to his right leg were taken care of the left leg. Fortunately I do not see any evidence of active infection locally or systemically at this time. 10-26-2021 upon evaluation today patient's wounds actually are showing signs of doing well in fact he really does not have wounds as much is weeping of the lower extremities. Fortunately I do not see any evidence of active infection locally or systemically which is great news. No fever or chills noted 11-02-2021 upon evaluation today patient appears to be doing decently well in regard to his legs. Of actually been on the phone quite a bit discussing with [facility] orthotics custom compression for him. I also have been discussing everything with his  lymphedema clinic provider as well. Helene Kelp notes that there is really not much more that she can do for him which was noted last week as well. Subsequently I do believe that the patient would benefit from getting custom compression in fact I think that is what he is going require to keep anything under control here. He voiced understanding. 11-08-2021 upon evaluation today patient appears to be doing excellent in regard to his legs and feet. Fortunately I do not see any signs of infection at this time which is great news. No fever or chills noted again I am still been working on trying to figure out what exactly we need to do as far as getting the [place] involved in coverage for custom compression for this patient. I also discussed with him that he does need to have custom shoes which I completely understand. With that being said it does make it difficult thinking about what kind of combination of shoes and compression is going to be best for him for the long-term. Fortunately I do not see any evidence of active infection locally or systemically which is great news. Overall I think you are doing quite well. 11-16-2021 upon evaluation today patient appears to be doing well currently in regard to his legs and feet. Everything is significantly smaller [employer], [name] ([id number]) compared to what has been. Fortunately I do not see any evidence of active infection locally or systemically at this time which is great news. 11-23-2021 upon evaluation today patient appears to be doing well with regard to his legs bilaterally. We can actually initiate treatment with a recommendation today to utilize Tubigrip and try to see if we can get this under better control. The patient is in agreement with the plan. Nonetheless the big question is whether this will be something that he can utilize at home in order to keep his swelling down and allow him to be able to function without having to come into the clinic as frequently.  He notes that he would love to get to this point. He does have a cousin who is willing to help him with change out the Tubigrip when necessary. 7/25; the patient does not have any open wounds however his left leg is a lot more swollen than it was last week. His cousin came in who presumably would be the one to change his juxta lites to practice on doing this for the patient. She apparently lives in the [place] block. In the meantime he has been using Tubigrip 12-07-2021 upon  evaluation today patient appears to be doing well with regard to his legs although we are transitioning to try to get him to use the Tubigrip which again has been a little bit of a setback but not too much overall I think he is still doing well he still able to wear his current shoes which is good news. He does have a cousin who is willing to help him with the Tubigrip and we are training her how to do this as well. She is here today. 12-14-2021 upon evaluation today patient appears to be doing well currently in regard to his right leg which I think is doing excellent with the Tubigrip. With regard to the left leg unfortunately this is not doing nearly as well and will get me to do something to intervene here. I think getting back into a 4-layer wraps can be the way to go short-term To get this back under control and the patient voiced understanding. 12-21-2021 upon evaluation today patient appears to be tolerating the dressing changes currently without complication the wrap of the left leg is doing well the right leg reason Tubigrip. The wound is looking significantly improved. 12-28-2021 upon evaluation today patient appears to be doing well currently in regard to his wound. He has been tolerating the dressing changes without complication. Fortunately there is no evidence of active infection locally or systemically at this time. I think he is very close to complete resolution on the left leg and I think we are ready to go back to the  Tubigrip. 01-04-2022 upon evaluation today patient appears to be doing okay in regard to his legs of the more swollen because he was rolling and pushing the Tubigrip down because it felt "too tight". With that being said I discussed with the patient today that we really do need to keep this pulled up and if he keeps it pulled up and when it starts to feel tight gets to where he can elevate his legs I think that he can keep this under control. I would suggest if it starts feeling tight that he lay down where he can elevate his legs above heart level to basically help the swelling to go down is much as possible. He voiced understanding. I also told him that is can to be his job to keep these pulled up and that if he is not able to do this I Georgina Peer probably have to refer him elsewhere to get a second opinion as I have pretty much done everything I can try to get his legs under control and keep them under control he is much better than where he used to be and his feet can actually fit in his shoes which is great but in general I just do not know what else to do he has his cousin helping with putting the Tubigrip's on which is awesome and a great resource for him but again she cannot be there to keep them pulled up and nor is that even her job. 01-11-2022 upon evaluation today patient's legs are looking much better. He actually kept the Tubigrip up this week which has made a tremendous difference and improvement in his overall appearance today. Fortunately I do not see any signs of active infection locally or systemically at this time. 01-18-2022 upon evaluation today patient appears to be doing well currently in regard to his legs. If active does not appear to be anything open at this point. This is great news. 01-25-2022 upon evaluation today patient  appears to be doing well currently in regard to his legs. Fortunately I do not see any signs of significant open wounds which is great news and overall I do  believe that he is doing quite well. I do not see any evidence of active infection locally or systemically at this time which is great news. No fevers, chills, nausea, vomiting, or diarrhea. 02-01-2022 upon evaluation patient actually appears to be doing quite well with just 2 slightly draining areas noted at this point. Fortunately I do not see any evidence of active infection locally or systemically which is great news and overall I am extremely pleased with where we stand today. Electronic Signature(s) Signed: 02/01/2022 4:41:34 PM By: Worthy Keeler PA-C Entered By: Worthy Keeler on 02/01/2022 16:41:33 [name] ([id number]) -------------------------------------------------------------------------------- Physical Exam Details Patient Name: [name] Date of Service: 02/01/2022 11:00 AM Medical Record Number: [id number] Patient Account Number: [id number] Date of Birth/Sex: 1950/12/15 (71 y.o. M) Treating RN: [name] Primary Care Provider: Fontaine No Other Clinician: Referring Provider: Fontaine No Treating Provider/Extender: Skipper Cliche in Treatment: 4 Constitutional Well-nourished and well-hydrated in no acute distress. Respiratory normal breathing without difficulty. Psychiatric this patient is able to make decisions and demonstrates good insight into disease process. Alert and Oriented x 3. pleasant and cooperative. Notes Upon inspection patient's legs again are dramatically improved compared to where we have been and I do believe that we are on the right track here. I do not see any signs of active infection at this time. Electronic Signature(s) Signed: 02/01/2022 4:42:22 PM By: Worthy Keeler PA-C Entered By: Worthy Keeler on 02/01/2022 16:42:22 [name] ([id number]) -------------------------------------------------------------------------------- Physician Orders Details Patient Name: [name] Date of Service: 02/01/2022 11:00  AM Medical Record Number: [id number] Patient Account Number: [id number] Date of Birth/Sex: 1951/01/17 (71 y.o. M) Treating RN: [name] Primary Care Provider: Fontaine No Other Clinician: Referring Provider: Fontaine No Treating Provider/Extender: Skipper Cliche in Treatment: 55 Verbal / Phone Orders: No Diagnosis Coding ICD-10 Coding Code Description E11.622 Type 2 diabetes mellitus with other skin ulcer I89.0 Lymphedema, not elsewhere classified I87.2 Venous insufficiency (chronic) (peripheral) L97.822 Non-pressure chronic ulcer of other part of left lower leg with fat layer exposed L97.512 Non-pressure chronic ulcer of other part of right foot with fat layer exposed I10 Essential (primary) hypertension L97.811 Non-pressure chronic ulcer of other part of right lower leg limited to breakdown of skin Follow-up Appointments o Return Appointment in 1 week. o Nurse Visit as needed Bathing/ Shower/ Hygiene o May shower with wound dressing protected with water repellent cover or cast protector. o No tub bath. Anesthetic (Use 'Patient Medications' Section for Anesthetic Order Entry) o Lidocaine applied to wound bed Edema Control - Lymphedema / Segmental Compressive Device / Other Bilateral Lower Extremities o Tubigrip double layer applied - double layer size D bilat just behind toes to patella knotch , double layer size C foot ankles enclose toes to just above ankle scell to any weeping areas o Elevate, Exercise Daily and Avoid Standing for Long Periods of Time. o Elevate legs to the level of the heart and pump ankles as often as possible o Elevate leg(s) parallel to the floor when sitting. o Compression Pump: Use compression pump on left lower extremity for 60 minutes, twice daily. - 2 times per day o DO YOUR BEST to sleep in the bed at night. DO NOT sleep in your recliner. Long hours of sitting in a recliner leads to swelling of the legs  and/or potential wounds on your backside. o Other: - Contact prescriber regarding use of diuretics to reduce fluid overload. Off-Loading o Turn and reposition every 2 hours Additional Orders / Instructions o Follow Nutritious Diet and Increase Protein Intake Electronic Signature(s) Signed: 02/01/2022 11:29:08 AM By: [name] RN Signed: 02/01/2022 4:52:08 PM By: Worthy Keeler PA-C Entered By: [name] on 02/01/2022 11:29:08 [name] ([id number]) -------------------------------------------------------------------------------- Problem List Details Patient Name: [name] Date of Service: 02/01/2022 11:00 AM Medical Record Number: [id number] Patient Account Number: [id number] Date of Birth/Sex: [birth year] (71 y.o. M) Treating RN: [name] Primary Care Provider: Fontaine No Other Clinician: Referring Provider: Fontaine No Treating Provider/Extender: Skipper Cliche in Treatment: 91 Active Problems ICD-10 Encounter Code Description Active Date MDM Diagnosis E11.622 Type 2 diabetes mellitus with other skin ulcer 01/26/2021 No Yes I89.0 Lymphedema, not elsewhere classified 01/26/2021 No Yes I87.2 Venous insufficiency (chronic) (peripheral) 01/26/2021 No Yes L97.822 Non-pressure chronic ulcer of other part of left lower leg with fat layer 01/26/2021 No Yes exposed L97.512 Non-pressure chronic ulcer of other part of right foot with fat layer 01/26/2021 No Yes exposed [place] (primary) hypertension 01/26/2021 No Yes L97.811 Non-pressure chronic ulcer of other part of right lower leg limited to 02/15/2021 No Yes breakdown of skin Inactive Problems Resolved Problems Electronic Signature(s) Signed: 02/01/2022 11:15:14 AM By: Worthy Keeler PA-C Entered By: Worthy Keeler on 02/01/2022 11:15:14 [name] ([id number]) -------------------------------------------------------------------------------- Progress Note Details Patient Name: [name] Date of Service: 02/01/2022 11:00 AM Medical Record Number: [id number] Patient Account Number: [id number] Date of Birth/Sex: Jan 04, 1951 (71 y.o. M) Treating RN: [name] Primary Care Provider: Fontaine No Other Clinician: Referring Provider: Fontaine No Treating Provider/Extender: Skipper Cliche in Treatment: 102 Subjective Chief Complaint Information obtained from Patient Left LE ulcers History of Present Illness (HPI) 71 year old male who presented to the ER with bilateral lower extremity blisters which had started last week. he has a past medical history of leukemia, diabetes mellitus, hypertension, edema of both lower extremities, his recurrent skin infections, peripheral vascular disease, coronary artery disease, congestive heart failure and peripheral neuropathy. in the ER he was given Rocephin and put on Silvadene cream. he was put on oral doxycycline and was asked to follow-up with the [facility]. His last hemoglobin A1c was 6.6 in December and he checks his blood sugar once a week. He does not have any physicians outside the [place] system. He does not recall any vascular duplex studies done either for arterial or venous disease but was told to wear compression stockings which he does not use 05/30/2016 -- we have not yet received any of his notes from the [facility] hospital system and his arterial and venous duplex studies are scheduled here in [place] around mid February. We are unable to have his insurance accepted by home health agencies and hence he is getting dressings only once a week. 06/06/16 -- -- I received a call from the patient's PCP at the [facility] and spoke to Dr. Garvin Fila, phone number [phone] and fax number [phone]. She confirmed that no vascular testing was done over the last 5 years and she would be happy to do them if the patient did want them to be done at the [place] and we could fax him a request. Readmission: 71 year old  male seen by as in February of this year and was referred to vein and vascular for studies and opinion from the vascular surgeons. The patient returns today with a fresh problem  having had blisters on his left lower extremity which have been there for about 5 days and he clearly states that he has been wearing his compression stockings as advised though he could not read the moderate compression and has been wearing light compression. Review of his electronic medical records note that he had lower extremity arterial duplex examination done on 06/23/2016 which showed no hemodynamically significant stenosis in the bilateral lower extremity arterial system. He also had a lower extremity venous reflux examination done on 07/07/2016 and it was noted that he had venous incompetence in the right great saphenous vein and bilateral common femoral veins. Patient was seen by Dr. Tamala Julian on the same day and for some reason his notes do not reflect the venous studies or the arterial studies and he recommended patient do a venous duplex ultrasound to look for reflux and return to see him.he would also consider a lymph pump if required. The patient was told that his workup was normal and hence the patient canceled his follow-up appointment. 02/03/17 on evaluation today patient left medial lower extremity blister appears to be doing about the same. It is still continuing to drain and there's still the blistered skin covering the wound bed which is making it difficult for the alternate to do its job. Fortunately there is no evidence of cellulitis. No fevers chills noted. Patient states in general he is not having any significant discomfort. Patient's lower extremity arterial duplex exam revealed that patient was hemodynamically stable with no evidence of stenosis in regard to the bilateral lower extremities. The lower extremity venous reflux exam revealed the patient had venous incontinence noted in the right greater  saphenous and bilateral common femoral vein. There is no evidence of deep or superficial vein thrombosis in the bilateral lower extremities. Readmission: 11/12/18 Patient presents for evaluation our clinic today concerning issues that he is having with his left lower extremity. He tells me that a couple weeks ago he began developing blisters on the left lower extremity along with increased swelling. He typically wears his compression stockings on a regular basis is previously been evaluated both here as well is with vascular surgery they would recommend lymphedema pumps but unfortunately that somehow fell through and he never heard anything back from that. Nonetheless I think lymphedema pumps would be beneficial for this patient. He does have a history of hypertension and diabetes. Obviously the chronic venous stasis and lymphedema as well. At this point the blisters have been given in more trouble he states sometimes when the blisters openings able to clean it down with alcohol and it will dry out and do well. Unfortunately that has not been the case this time. He is having some discomfort although this mean these with cleaning the areas he doesn't have discomfort just on a regular basis. He has not been able to wear his compression stockings since the blisters arose due to the fact that of course it will drain into the socks causing additional issues and he didn't have any way to wrap this otherwise. He has increased to taking his Lasix every day instead of every other day. He sees his primary care provider later this month as well. No fevers, chills, nausea, or vomiting noted at this time. 11/19/18-Patient returns at 1 week, per intake RN the amount of seepage into the compression wraps was definitely improved, overall all the wounds are measuring smaller but continuing silver alginate to the wounds as primary dressing 11/26/18 on evaluation today patient appears to be  doing quite well in regard to  his left lower [place] ulcers. In fact of the areas that were noted initially he only has two regions still open. There is no evidence of active infection at this time. He still is not heard anything from the company regarding lymphedema pumps as of yet. Again as previously seen vascular they have not recommended any surgical intervention. RYKEN, PASCHAL ([id number]) 12/03/2018 on evaluation today patient actually appears to be doing quite well with regard to his lower extremity ulcers. In fact most of the areas appear to be healed the one spot which does not seem to be completely healed I am unsure of whether or not this is really draining that much but nonetheless there does not appear to be any signs of infection or significant drainage at this point. There is no sign of fever, chills, nausea, vomiting, or diarrhea. Overall I am pleased with how things have progressed I think is very close to being able to transition to his home compression stockings. 12/10/2018 upon evaluation today patient appears to be doing quite well with regard to his left lower extremity. He has been tolerating the dressing changes without complication. Fortunately there is no signs of active infection at this time. He appears after thorough evaluation of his leg to only have 1 small area that remains open at this point everything else appears to be almost completely closed. He still have significant swelling of the left lower extremity. We had discussed discussing this with his primary care provider he is not able to see her in person they were at the [facility] and right now the [place] is not seeing patients on site. According to the patient anyway. Subsequently he did speak with her apparently and his primary care provider feels that he may likely have a DVT. With that being said she has not seen his leg she is just going off of his history. Nonetheless that is a concern that the patient now has as well and while I do not feel  the DVT is likely we can definitely ensure that that is not the case I will go ahead and see about putting that order in today. Nonetheless otherwise I am in a recommend that we continue with the current wound care measures including the compression therapy most likely. We just need to ensure that his leg is indeed free of any DVTs. 12/17/2018 on evaluation today patient actually appears to be completely healed today. He does have 2 very small areas of blistering although this is not anything too significant at this point which is good news. With that being said I am in agreement with the fact that I think he is completely healed at this point. He does want to get back into his compression stocking. The good news is we have gotten approval from insurance for his lymphedema pumps we received a letter since last saw him last week. The other good news is his study did come back and showed no evidence of a DVT. 12/20/2018 on evaluation today patient presents for follow-up concerning his ongoing issues with his left lower extremity. He was actually discharged last Friday and did fairly well until he states blisters opened this morning. He tells me he has been wearing his compression stocking although he has a hard time getting this on. There does not appear to be any signs of active infection at this time. No fevers, chills, nausea, vomiting, or diarrhea. 12/27/2018 on evaluation today patient appears to be doing  very well with regard to his swelling of the left lower extremity the 4 layer compression wrap seems to have been beneficial for him. Fortunately there is no signs of active infection at this time. Patient has been tolerating the compression wrap without complication and his foot swelling in particular appears to be greatly improved. He does still have a wound on the lateral portion of his left leg I believe this is more of a blister that has now reopened. 01/03/2019 on evaluation today patient  actually appears to be doing excellent in regard to his left lower extremity. He did receive his compression pumps and is actually use this 7 times since he was last here in the office. On top of the compression wrap he is now roughly 3 cm better at the calf and 2 cm better at the ankle he also states that his foot seem to go an issue better without even having to use a shoe horn. Obviously I think this is all evidence that he is doing excellent in this regard. The other good news is he does not appear to have anything open today as far as wounds are concerned. 01/15/2019 on evaluation today patient appears to be doing more poorly yet again with regard to his left lower extremity. He has developed new wounds again after being discharged just recently. Unfortunately this continues to be the case that he will heal and then have subsequent new wounds. The last time I was hopeful that he may not end up coming back too quickly especially since he states he has been using his lymphedema pumps along with wearing his compression. Nonetheless he had a blister on the back of his leg that popped up on the left and this has opened up into an ulceration it is quite painful. 01/22/19 on evaluation today patient actually appears to be doing well with regard to his wound on the left lower extremity. He's been tolerating the dressing changes without complication including the compression wrap in the wound appears to be significantly smaller today which is great news. Overall very pleased in this regard. 01/29/2019 on evaluation today patient appears to be doing well with regard to his left posterior lower extremity ulcer. He has been tolerating the dressing changes without complication. This is not completely healed but is getting much closer. We did order a Farrow wrap 4000 for him he has received this and has it with him today although I am not sure we are quite ready to start him on that as of yet. We are very  close. 02/05/2019 on evaluation today patient actually appears to be doing quite well with regard to his left posterior lower extremity ulcer. He still has a very tiny opening remaining but the fortunate thing is he seems to be healing quite nicely. He also did get his Farrow wrap which I am hoping will help with his edema control as well at home. Fortunately there is no evidence of active infection. 02/12/2019 patient and fortunately appears to be doing poorly in regard to his wounds of the left lower extremity. He was very close to healing therefore we attempted to use his Velcro compression wraps continuing with lymphedema pumps at home. Unfortunately that does not seem to have done very well for him. He tells me that he wore them all the time but again I am not sure why if that is the case that he is having such significant edema. He is still on his fluid pills as well. With  that being said there is no obvious sign of infection although I do wonder about the possibility of infection at this time as well. 02/19/2019 unfortunately upon evaluation today patient appears to be doing more poorly with regard to his left lower extremity. He is not showing signs of significant improvement and I think the biggest issue here is that he does have an infection that appears to likely be Pseudomonas. That is based on the blue-green drainage that were noted at this time. Unfortunately the antibiotic that has been on is not going to take care of this at all. I think they will get a need to switch him to either Levaquin or Cipro and this was discussed with the patient. 02/26/2019 on evaluation today patient's lower extremity on the left appears to be doing significantly better as compared to last evaluation. Fortunately there is no signs of active infection at this time. He has been tolerating the compression wrap without complication in fact he made it the whole week at this point. He is showing signs of excellent  improvement I am very happy in this regard. With that being said he is having some issues with infection we did review the results of his culture which I noted today. He did have a positive finding for Enterobacter as well as Alcaligenes faecalis. Fortunately the Levaquin that I placed him on will work for both which is great news. There is no signs of systemic infection at this point. 10/30; left posterior leg wound in the setting of very significant edema and what looks like chronic venous inflammation. He has compression pumps but does not use them. We have been using 3 layer compression. Silver alginate to the wound as the primary dressing 03/18/2019 on evaluation today patient appears to be doing a little better compared to last time I saw him. He really has not been using his compression pumps he tells me that he is having too much discomfort. He has been keeping his wraps on however. He is only been taking his fluid pills every other day because he states they are not really helping and he has an appointment with his primary care provider at the [facility] tomorrow. Subsequently the wound itself on the left lower extremity does seem to be greatly improved compared to previous. LYMAN, BALINGIT ([id number]) 03/25/2019 on evaluation today patient appears to be doing better with regard to his wounds on the bilateral lower extremities. The left is doing excellent the right is also doing better although both still do show some signs of open wounds noted at this point unfortunately. Fortunately there is no signs of active infection at this time. The patient also is not really having any significant pain which is good news. Unfortunately there was some confusion with the referral on vascular disease and as far as getting the patient scheduled there can be contacting him later today to do this fortunately we got this straightened out. 04/01/2019 on evaluation today patient appears to be doing no fevers, chills,  nausea, vomiting, or diarrhea. Excellent at this time with regard to his lower extremities. There does not appear to be any open wound at this point which is good news. Fortunately is also no signs of active infection at this time. Overall feel like the patient has done excellent with the compression the problem is every time we got him to this point and then subsequently go to using his own compression things just go right back to where they were. I am not sure  how to address this we can try to get an appointment with vascular for 2 weeks now they have yet to call him. Obviously this has become frustrating for the patient as well. I think the issue has just been an honest error as far as scheduling is concerned but nonetheless still worn out the point where I am unsure of which direction we should take. 04/08/2019 on evaluation today patient actually appears to be doing well with regard to his lower extremities. There are no open wounds at this time and things seem to be managing quite nicely as far as the overall edema control is concerned. With that being said he does have his compression socks today for [place] to go ahead and reinitiate therapy in that manner at this point. He is going to be going for shoes to be measured on Wednesday and then coincidentally he will also be seeing vascular on Thursday. Overall I think this is good news and again I am hopeful that they will be able to do something for him to help prevent ongoing issues with edema control as well. No fevers, chills, nausea, vomiting, or diarrhea. 04/11/2019 on evaluation today patient actually appears to be doing poorly after just being discharged on Monday of this week. He had been experiencing issues with again blisters especially on the left lower extremity. With that being said he was completely healed and appeared to be doing great this past Monday. He then subsequently has new blisters that formed before his appointment with  vascular this morning. He was also measured for shoes in the interim. With that being said we may have figured out what exactly is going on and why he continues to have issues like what we are seeing at this point. He takes his compression stockings off at nighttime and then he ends up having to sleep in his chair for 5-6 hours a night. He sleeps with his feet down he cannot really get him up in the recliner and therefore he is sleeping and the worst possible his position with his feet on the floor for that majority of the time. Again as I explained to him that is about one third at minimum at least one fourth of his day that he spending with his feet dangling down on the ground and the worst possible position they could be. I think this may be what is causing the issue. Subsequently I am leaning toward thinking that he may need a hospital bed in order to elevate his legs. We likely can have to coordinate this with his primary care provider at the [facility]. Readmission: 01/26/2021 this is a patient who presents for repeat evaluation here in the clinic although it is actually been couple of years since have seen him in fact it was December 2020 when I last saw him. Subsequently he never really healed but did end up being lost to follow-up. He tells me has been having issues ongoing with his lower extremities has bilateral lower extremity lymphedema no real significant or definitive open wounds but in general his lymphedema is way out of control. We were never able to refer him to lymphedema clinic simply due to the fact to be honest we were never able to get him completely healed. I do not see anyone with open wounds. The patient does have evidence of type 2 diabetes mellitus, lymphedema, chronic venous insufficiency, and hypertension. That really has not changed since his last evaluation. 02/09/2021 upon evaluation today patient appears to be  doing a little better in regard to his legs although he  still having a tremendous amount of drainage especially on the left leg. Fortunately there does not appear to be any evidence of active infection. Of note when we looked into this further it appears that the patient did not have any absorptive dressing on it was just the 4-layer compression wrap. Nonetheless this is probably big part of the issue here. 10/10; he comes in today with 3 large areas on the upper right lower leg likely remanence of denuded blistering under his compression wraps. He has no other wounds on the right. On the left he has the denuded area on the left medial foot and ankle and on the left dorsal foot. Massive lymphedema in both feet dorsally. Using Zetuvit under compression We have increased home health visitation to twice a week to change the dressings and will change it once 02/22/2021 upon evaluation today patient appears to be doing well currently with regard to his wounds. He has been tolerating the dressing changes without complication. Fortunately there does not appear to be any evidence of active infection which is great news. No fevers, chills, nausea, vomiting, or diarrhea. The biggest issue I see currently is that home health is not putting any medicine on the actual wounds before wrapping. 03/01/2021 upon evaluation today the patient's right leg actually appears to be doing quite well which is great news there does not appear to be any evidence of active infection at this time. No fevers, chills, nausea, vomiting, or diarrhea. With that being said the patient is having issues on the left foot where he is having significant drainage is also an ammonia smell he does not have any animals at home and this makes me concerned about a bacteria producing urea as a byproduct. Again the possible common organisms will be E. coli, Proteus, and Enterococcus. All 3 of which can be successfully treated with Levaquin. For that reason I think that this may be a good option for [place] to  consider placing him on and I did obtain a culture as well for confirmation sake. 03/08/2021 upon evaluation today patient appears to be doing unfortunately still somewhat poorly in regard to his leg ulcerations. He actually has an area on the right leg where he blistered due to the fact that his wrap slid down and caused an area of pinching on his skin and this has led to a significant issue here. 03/15/2021 upon evaluation today patient unfortunately has not been wrapped appropriately with absorptive dressings nor with the appropriate technique for the third layer of the 4-layer compression wrap. These are issues that we continue to try to address with the home health nurse. Also the absorptive dressing that she had was cut in half and therefore that causes things to leak out it does not actually trap the fluid in regard to the top of the foot overall I think that all these combined are really not seeing things improved significantly here. Fortunately there does not appear to be any signs of significant infection at this time which is good news. He still is having a tremendous amount of drainage. 03/22/2021 upon evaluation today patient appears to be draining tremendously. He still continues to tell me that he is using his pumps 2 times a day and that coupled with that tells me that he is elevating his legs as well. With that being said all things considered I am really just not seeing the improvement we would expect to  see with the 4-layer compression wrap and all the above noted. He in fact had an extremely large Zetuvit dressing on both legs and that they were extremely filled to the max with fluid. This is after just being changed just before the weekend and this MYLZ, YUAN ([id number]) is Monday. Nonetheless I am concerned about the fact that there is something going on fairly significant that we cannot get any of this under control and that he is draining this significantly. He supposed be  having an echocardiogram it sounds like scheduling has been an issue for him as far as getting in sooner. Its something to do with needing his cousin to drive him because of where it sat and he cannot drive himself to this appointment either way I really think he needs to try to see what he can do about making this happen a little sooner. He tells me he will call today. 03/29/2021 on evaluation today patient appears to be doing about the same in regard to his legs. He did get his cardiology appointment moved up to 6 December which is at least good that is better than what it was before mid December. Overall very pleased in that regard. 04/05/2021 upon evaluation today patient unfortunately is still doing fairly poorly. There does not appear to be any signs of active infection at this time. No fevers, chills, nausea, vomiting, or diarrhea. Unfortunately I think until his edema is under control and overall fluid overload there is really not to be much chance that I can do much to get him better. This is quite unfortunate and frustrating both for myself and the patient to be perfectly honest. Nonetheless I think that he really needs to have a conversation both with his primary care provider as well as cardiologist he sees the PCP on Monday and cardiology on Wednesday of next week. 04/13/2021 upon evaluation today patient appears to be doing poorly in regard to his bilateral lower extremities his left is still worse than the right. With that being said he has a tremendous amount of drainage he did see his primary care provider yesterday there really was not much there to be done from their perspective. He sees cardiology tomorrow. Nonetheless my biggest concern here is simply that if we do not get the edema under control he is going to continue to have drainage and honestly I think at some point he is going to become infected severely that is my main concern. 04/19/2021 upon evaluation today patient  appears to be doing poorly still in regard to his legs. Unfortunately there does not appear to be any signs of infection at this point. He does have a tremendous amount of drainage however. We have not seen the results back from the cardiologist and the echocardiogram that was done. It appears that the patient checked out okay as far as that is concerned with regard to ejection fraction though we still have some issues here to be honest with his diastolic function. I am unsure if this is accounting for everything that we are seeing or not. Either way he has a tremendous amount of drainage from his legs that we are just not able to control in the outpatient setting at this point. I have reached out to Dr. Rockey Situ his cardiologist to see once he reviews the sheet if there is anything that he feels like can be done from an outpatient perspective if not then I think the way to go is probably can to be through  inpatient admission and diuresis. Otherwise I am not sure how working to get this under control we tried antibiotics, compression wrapping, and I have told the patient to be elevating his legs I am not sure how much he does of this but either way I think that this is still an ongoing issue nonetheless. 04/26/2021 upon evaluation today patient appears to be doing poorly in regard to his legs. He is having a tremendous amount of fluid at this point which is quite unfortunate. Its to the point that he may have had at least 5 to 10 pounds of fluid in his dressings this morning when they were removed these were changed this Friday. Subsequently I think he needs to go to the ER for further evaluation and treatment I think is probably can need diuresis possibly even IV antibiotics been on what the blood work looks like but in general I feel like he needs something to get this under control from an outpatient perspective absent of everywhere I can think of and I cannot get this under control with our  traditional measures. I think this is going require more so that we can get him better 12/30; this is a patient with severe bilateral lymphedema. He was hospitalized from 04/26/2021 through 04/29/2021 treated for cellulitis in the setting of lower extremity ulcers and lymphedema. After he left the hospital he is apparently seen for nurse visit our staff contacted cardiology and he has been started on Lasix 40 mg. Apparently his legs have less edema. Lab work from 05/04/2021 showed a BUN of 38 and creatinine of 1.59 these are elevated versus previous where his creatinine seems to have been 1.30 on 12/19 his potassium is 4.3. I believe the lab work is being followed by cardiology We have him in a 4-layer wrap. Xeroform on the leg wounds and sit to fit on the Berry damage skin on the left dorsal foot versus right dorsal foot. He has compression pumps but does not use them. We have apparently not yet ordered him compression stockings 05/17/2021 upon evaluation today patient's legs though better than last time I personally saw him appear to be getting worse compared to where they were previous. Dr. Quentin Cornwall was actually last 1 to see you I have not seen him since 19 December. That was before he went into the ER. Coming out apparently his legs looked also and they still look better but not as good as they were in the past. 1/16; patient with severe bilateral lymphedema. Severe scaled hyperkeratotic skin on the dorsal aspect of his distal left foot and left medial ankle.. On the right side changes are not as bad. He did not have any weeping edema. Our intake nurse was convinced that he is being compliant with compression pumps 1 hour twice a day 05/31/2021 upon evaluation today patient actually appears to be doing a little bit better in my opinion in regard to his feet. I do not see as much drainage and it being just completely wet as it was previous. Fortunately I do not also see any signs of active infection  which is great news as well. 06/07/21 Upon inspection patient's wound bed actually showed signs of doing well he is not nearly as weepy and wet as he has been in the past and overall very pleased in that regard. Fortunately I do not see any signs of active infection locally or nor systemically at this time. Which is great news. No fevers, chills, nausea, vomiting, or diarrhea. 06/14/2021  upon evaluation today patient appears to be doing well with regard to his right foot I am pleased in that regard. His left foot is still draining quite a bit despite using lymphedema pumps, 4-layer compression wraps, and he tells me elevating his legs as well. He also has Lasix that he takes twice a day. Nonetheless I believe that this is still good to be an ongoing issue. We have a hard time getting this under control as far as the swelling is concerned. 06/21/2021 upon evaluation today patient appears to be doing decently well in regard to his wounds all things considered. He still has a tremendous amount of drainage and fluid noted at this point. Fortunately I do not see any signs of active infection locally or systemically at this point which is great news. Nonetheless I am unsure where to go and how to do this as far as trying to limit his swelling and weeping from his toes in particular. 06/28/2021 upon evaluation patient unfortunately continues to have significant drainage from his feet. We have been keeping him in a compression wrap and despite this he still continues to have extreme fluid issues he seen his cardiologist he is seeing the nephrologist. We really cannot find any way to get this under control when he did well was when he was in the hospital and they got some of the fluid away. But outside of that we are just struggling to achieve the long-term goal of getting this under control and keeping it under control unfortunately. 07/05/2021 upon evaluation today patient appears to be doing poorly in regard to  his feet. Unfortunately this continues to be a significant issue and to be honest I am really not certain what to do about it. I referred him to Dr. Randol Kern at [facility] and he does have an appointment although it is PRESLEY, SUMMERLIN ([id number]) 12 April. He also sees his primary care provider on 6 April. He did not want to see Dr. Haynes Kerns until after he saw his PCP that is the reason the appointment so far out. There is really not much I can do in that regard. Nonetheless I do think that we are still continue to have significant lymphedema issues with significant mount of weeping in regard to the feet and again this has just become extremely difficult to manage to be honest I am not sure if there is something else that Dr. Haynes Kerns or someone else could recommend he also will be seeing Dr. Dellia Nims in 2 weeks when I am on vacation and at that time I will see if Dr. Dellia Nims has any ideas about where to go from here in the meantime. 07/12/2021 upon evaluation today patient appears to be doing about as well as can be expected with regard to his feet. He does actually see his kidney doctor this Friday. He also will be seeing his primary care provider on April 4 and then following that around mid April he will be seeing Dr. Haynes Kerns at [facility] which was a referral made for him. Again my goal is to try to find out some way to fix this and to be perfectly honest we have had some issues with making any good adjustments. When he was in the hospital and greater amounts of Lasix he was able to get this down and it looked much better upon discharge. With that being said right now things just are not doing nearly as good as what they used to be. 3/13; patient presents for follow-up. He  has been using his lymphedema pumps over the past week. He reports an increase in his Lasix dose. He has no issues or complaints today. He denies signs of infection. 07/26/2021 upon evaluation today patient appears to be doing better in regard to his  feet bilaterally. Both are showing signs of much less drainage which is great news and overall very pleased in that regard. Fortunately there does not appear to be any evidence of active infection locally or systemically at this time. No fevers, chills, nausea, vomiting, or diarrhea. 08/02/2021 upon evaluation today patient appears to be doing well with regard to his feet. Both are showing signs of being drier the right pretty much has not really draining much at all which is great news. The left is not draining anywhere close to his much as it was during the last evaluation. This is excellent news and overall very pleased. 08/09/2021 upon evaluation today patient appears to be doing well with regard to his legs the right leg especially showing signs of excellent improvement which is great news I do not see any evidence of active infection locally or systemically which is great. In regard to the left leg he still has some weeping and drainage but nothing as significant as what it was in the past this is great news. 08-16-2021 upon evaluation today patient appears to actually be doing quite well in my opinion in regard to his feet. This is significantly improved compared to what we previously seen and overall I am extremely pleased in that regard. I do believe that He is actually improving although this is obviously very slow going. 08-23-2021 upon evaluation today patient appears to be doing well currently in regard to his right leg which actually is pretty dry at this point today. Fortunately I do not see any evidence of active infection at this time which is great news. No fevers, chills, nausea, vomiting, or diarrhea. 08-30-2021 upon evaluation today patient appears to be doing well with regard to his lower extremities. The right foot is pretty much completely dry which is great news the left foot though not completely dry seems to be doing decently well. I do not see any signs of active infection  locally or systemically which is great news. No fevers, chills, nausea, vomiting, or diarrhea. 09-06-2021 upon evaluation today patient appears to be doing well with regard to his feet in fact now the left foot is almost completely dry as well and I am definitely seeing a lot of significant improvement. With that being said unfortunately he actually appears to have some cellulitis of his right thigh. His toes are also little bit red but this may just be due to the increased swelling. He really is not warm to touch in regard to the toes. 5/8; excellent edema control on the right foot and lower leg there is no open wounds but we continue to put compression on this otherwise this will breakdown. He is using his compression pumps twice a day at home The area that is problematic is on the left dorsal foot some areas that are not fully epithelialized with very dry fissured skin over this area. 09-20-2021 upon evaluation today patient appears to be doing a little bit worse in regard to swelling at this time. Fortunately I do not see any signs of infection with that being said the wrap was not on quite as well as what I would like to have seen. I do believe that this has caused a little bit of  excess swelling and again we need to try to get this under good control. 09-27-2021 upon evaluation today patient appears to be doing awesome in regard to his feet and legs. Everything is measuring smaller the swelling is down and to be perfectly honest I am extremely pleased with the end of his feet especially on the left side and how dry this is today. I do think we are on the right track here. 5/31; patient presents for follow-up. He is using nystatin powder to the feet bilaterally under 4-layer compression. He has no issues or complaints today. He states he is going to the lymphedema clinic tomorrow. Since he has no open wounds on the right lower extremity they will be focused on the side. 10-11-2021 upon evaluation  today patient appears to be doing excellent in regard to his legs. He has been tolerating the dressing changes without complication. Fortunately there does not appear to be any evidence of active infection locally or systemically at this time which is great news. No fevers, chills, nausea, vomiting, or diarrhea. 10-18-2021 upon evaluation today patient appears to be doing well with regard to his legs. He has been tolerating the dressing changes without complication and actually seen in lymphedema clinic now in regard to his right leg were taken care of the left leg. Fortunately I do not see any evidence of active infection locally or systemically at this time. 10-26-2021 upon evaluation today patient's wounds actually are showing signs of doing well in fact he really does not have wounds as much is weeping of the lower extremities. Fortunately I do not see any evidence of active infection locally or systemically which is great news. No fever or chills noted 11-02-2021 upon evaluation today patient appears to be doing decently well in regard to his legs. Of actually been on the phone quite a bit discussing with [facility] orthotics custom compression for him. I also have been discussing everything with his lymphedema clinic provider as well. Helene Kelp notes that there is really not much more that she can do for him which was noted last week as well. Subsequently I do believe that the patient would benefit from getting custom compression in fact I think that is what he is going require to keep anything under control here. He voiced understanding. 11-08-2021 upon evaluation today patient appears to be doing excellent in regard to his legs and feet. Fortunately I do not see any signs of infection at this time which is great news. No fever or chills noted again I am still been working on trying to figure out what exactly we need to do as far as getting the [place] involved in coverage for custom compression for this  patient. I also discussed with him that he does need to have [place], [name] ([id number]) which I completely understand. With that being said it does make it difficult thinking about what kind of combination of shoes and compression is going to be best for him for the long-term. Fortunately I do not see any evidence of active infection locally or systemically which is great news. Overall I think you are doing quite well. 11-16-2021 upon evaluation today patient appears to be doing well currently in regard to his legs and feet. Everything is significantly smaller compared to what has been. Fortunately I do not see any evidence of active infection locally or systemically at this time which is great news. 11-23-2021 upon evaluation today patient appears to be doing well with regard to his legs  bilaterally. We can actually initiate treatment with a recommendation today to utilize Tubigrip and try to see if we can get this under better control. The patient is in agreement with the plan. Nonetheless the big question is whether this will be something that he can utilize at home in order to keep his swelling down and allow him to be able to function without having to come into the clinic as frequently. He notes that he would love to get to this point. He does have a cousin who is willing to help him with change out the Tubigrip when necessary. 7/25; the patient does not have any open wounds however his left leg is a lot more swollen than it was last week. His cousin came in who presumably would be the one to change his juxta lites to practice on doing this for the patient. She apparently lives in the [place] block. In the meantime he has been using Tubigrip 12-07-2021 upon evaluation today patient appears to be doing well with regard to his legs although we are transitioning to try to get him to use the Tubigrip which again has been a little bit of a setback but not too much overall I think he is still  doing well he still able to wear his current shoes which is good news. He does have a cousin who is willing to help him with the Tubigrip and we are training her how to do this as well. She is here today. 12-14-2021 upon evaluation today patient appears to be doing well currently in regard to his right leg which I think is doing excellent with the Tubigrip. With regard to the left leg unfortunately this is not doing nearly as well and will get me to do something to intervene here. I think getting back into a 4-layer wraps can be the way to go short-term To get this back under control and the patient voiced understanding. 12-21-2021 upon evaluation today patient appears to be tolerating the dressing changes currently without complication the wrap of the left leg is doing well the right leg reason Tubigrip. The wound is looking significantly improved. 12-28-2021 upon evaluation today patient appears to be doing well currently in regard to his wound. He has been tolerating the dressing changes without complication. Fortunately there is no evidence of active infection locally or systemically at this time. I think he is very close to complete resolution on the left leg and I think we are ready to go back to the Tubigrip. 01-04-2022 upon evaluation today patient appears to be doing okay in regard to his legs of the more swollen because he was rolling and pushing the Tubigrip down because it felt "too tight". With that being said I discussed with the patient today that we really do need to keep this pulled up and if he keeps it pulled up and when it starts to feel tight gets to where he can elevate his legs I think that he can keep this under control. I would suggest if it starts feeling tight that he lay down where he can elevate his legs above heart level to basically help the swelling to go down is much as possible. He voiced understanding. I also told him that is can to be his job to keep these pulled up and  that if he is not able to do this I Georgina Peer probably have to refer him elsewhere to get a second opinion as I have pretty much done everything I can try  to get his legs under control and keep them under control he is much better than where he used to be and his feet can actually fit in his shoes which is great but in general I just do not know what else to do he has his cousin helping with putting the Tubigrip's on which is awesome and a great resource for him but again she cannot be there to keep them pulled up and nor is that even her job. 01-11-2022 upon evaluation today patient's legs are looking much better. He actually kept the Tubigrip up this week which has made a tremendous difference and improvement in his overall appearance today. Fortunately I do not see any signs of active infection locally or systemically at this time. 01-18-2022 upon evaluation today patient appears to be doing well currently in regard to his legs. If active does not appear to be anything open at this point. This is great news. 01-25-2022 upon evaluation today patient appears to be doing well currently in regard to his legs. Fortunately I do not see any signs of significant open wounds which is great news and overall I do believe that he is doing quite well. I do not see any evidence of active infection locally or systemically at this time which is great news. No fevers, chills, nausea, vomiting, or diarrhea. 02-01-2022 upon evaluation patient actually appears to be doing quite well with just 2 slightly draining areas noted at this point. Fortunately I do not see any evidence of active infection locally or systemically which is great news and overall I am extremely pleased with where we stand today. Objective Constitutional Well-nourished and well-hydrated in no acute distress. Vitals Time Taken: 11:01 AM, Weight: 312 lbs, Temperature: 97.7 F, Pulse: 60 bpm, Respiratory Rate: 18 breaths/min, Blood Pressure:  144/75 mmHg. Respiratory normal breathing without difficulty. Psychiatric TYSHAUN, VINZANT ([id number]) this patient is able to make decisions and demonstrates good insight into disease process. Alert and Oriented x 3. pleasant and cooperative. General Notes: Upon inspection patient's legs again are dramatically improved compared to where we have been and I do believe that we are on the right track here. I do not see any signs of active infection at this time. 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 Nurse Visit as needed Bathing/ Shower/ Hygiene: May shower with wound dressing protected with water repellent cover or cast protector. No tub bath. Anesthetic (Use 'Patient Medications' Section for Anesthetic Order Entry): Lidocaine applied to wound bed Edema Control - Lymphedema / Segmental Compressive Device / Other: Tubigrip double layer applied - double layer size D bilat just behind toes to patella knotch , double layer size C foot ankles enclose toes to just above ankle scell to any weeping areas Elevate, Exercise Daily and Avoid Standing for Long Periods of Time. Elevate legs to the level of the heart and pump ankles as often as possible Elevate leg(s) parallel to the floor when sitting. Compression Pump: Use compression pump on left lower extremity for 60 minutes, twice daily. - 2 times per day DO YOUR BEST to sleep in the bed at night. DO NOT  sleep in your recliner. Long hours of sitting in a recliner leads to swelling of the  legs and/or potential wounds on your backside. Other: - Contact prescriber regarding use of diuretics to reduce fluid overload. Off-Loading: Turn and reposition every 2 hours Additional Orders / Instructions: Follow Nutritious Diet and Increase Protein Intake 1. Based on what I am seeing currently I do believe that the patient actually is doing extremely well. I do not see any signs of infection and I think that he is probably ready for discharge. The plan is to likely discharge him next week. 2. I am would recommend as well that we have the patient continue to elevate his legs much as possible he needs to be using his pumps as well. 3. I would also suggest that he continue to monitor for any signs of worsening if anything changes he should let me know. We will see patient back for reevaluation in 1 week here in the clinic. If anything worsens or changes patient will contact our office for additional recommendations. Electronic Signature(s) Signed: 02/01/2022 4:42:58 PM By: Worthy Keeler PA-C Entered By: Worthy Keeler on 02/01/2022 16:42:57 [name] ([id number]) -------------------------------------------------------------------------------- SuperBill Details Patient Name: [name] Date of Service: 02/01/2022 Medical Record Number: [id number] Patient Account Number: [id number] Date of Birth/Sex: 07/23/1950 (71 y.o. M) Treating RN: [name] Primary Care Provider: Fontaine No Other Clinician: Referring Provider: Fontaine No Treating Provider/Extender: Skipper Cliche in Treatment: 53 Diagnosis Coding ICD-10 Codes Code Description E11.622 Type 2 diabetes mellitus with other skin ulcer I89.0 Lymphedema, not elsewhere classified I87.2 Venous insufficiency (chronic) (peripheral) L97.822 Non-pressure chronic ulcer of other part of left lower leg with fat layer exposed L97.512 Non-pressure chronic ulcer of other part of right foot with fat layer  exposed I10 Essential (primary) hypertension L97.811 Non-pressure chronic ulcer of other part of right lower leg limited to breakdown of skin Facility Procedures CPT4 Code: 87867672 Description: [phone] - WOUND CARE VISIT-LEV 2 EST PT Modifier: Quantity: 1 Physician Procedures CPT4 Code: 9628366 Description: 99213 - WC PHYS LEVEL 3 - EST PT Modifier: Quantity: 1 CPT4 Code: Description: ICD-10 Diagnosis Description E11.622 Type 2 diabetes mellitus with other skin ulcer I89.0 Lymphedema, not elsewhere classified I87.2 Venous insufficiency (chronic) (peripheral) L97.822 Non-pressure chronic ulcer of other part of left lower  leg with fat layer Modifier: exposed Quantity: Electronic Signature(s) Signed: 02/01/2022 4:43:18 PM By: Worthy Keeler PA-C Previous Signature: 02/01/2022 11:42:03 AM Version By: [name] RN Entered By: Worthy Keeler on 02/01/2022 16:43:17

## 2022-02-03 NOTE — Progress Notes (Signed)
[name], Mooney ([id number]) Visit Report for 02/01/2022 Arrival Information Details Patient Name: [name], [name] Date of Service: 02/01/2022 11:00 AM Medical Record Number: [id number] Patient Account Number: [id number] Date of Birth/Sex: 1951-04-03 (71 y.o. M) Treating RN: [name] Primary Care Karmella Bouvier: Fontaine No Other Clinician: Referring Markia Kyer: Fontaine No Treating Bowen Kia/Extender: Skipper Cliche in Treatment: 60 Visit Information History Since Last Visit All ordered tests and consults were completed: No Patient Arrived: Ambulatory Added or deleted any medications: No Arrival Time: 11:01 Any new allergies or adverse reactions: No Accompanied By: self Had a fall or experienced change in No Transfer Assistance: None activities of daily living that may affect Patient Identification Verified: Yes risk of falls: Secondary Verification Process Completed: Yes Signs or symptoms of abuse/neglect since last visito No Patient Requires Transmission-Based No Hospitalized since last visit: No Precautions: Implantable device outside of the clinic excluding No Patient Has Alerts: Yes cellular tissue based products placed in the center Patient Alerts: AVVS consult on file since last visit: Last ABI-R 1.09; L Has Dressing in Place as Prescribed: Yes 1.04 Has Compression in Place as Prescribed: Yes Pain Present Now: No Electronic Signature(s) Signed: 02/03/2022 12:48:30 PM By: [name] RN Entered By: [name] on 02/01/2022 11:01:33 [name] ([id number]) -------------------------------------------------------------------------------- Clinic Level of Care Assessment Details Patient Name: [name] Date of Service: 02/01/2022 11:00 AM Medical Record Number: [id number] Patient Account Number: [id number] Date of Birth/Sex: [birth year] (71 y.o. M) Treating RN: [name] Primary Care Nisa Decaire: Fontaine No Other Clinician: Referring Pattricia Weiher:  Fontaine No Treating Habib Kise/Extender: Skipper Cliche in Treatment: 43 Clinic Level of Care Assessment Items TOOL 4 Quantity Score X - Use when only an EandM is performed on FOLLOW-UP visit 1 0 ASSESSMENTS - Nursing Assessment / Reassessment X - Reassessment of Co-morbidities (includes updates in patient status) 1 10 X- 1 5 Reassessment of Adherence to Treatment Plan ASSESSMENTS - Wound and Skin Assessment / Reassessment _0  - Simple Wound Assessment / Reassessment - one wound 0 _1  - 0 Complex Wound Assessment / Reassessment - multiple wounds _2  - 0 Dermatologic / Skin Assessment (not related to wound area) ASSESSMENTS - Focused Assessment _3  - Circumferential Edema Measurements - multi extremities 0 _4  - 0 Nutritional Assessment / Counseling / Intervention _5  - 0 Lower Extremity Assessment (monofilament, tuning fork, pulses) _6  - 0 Peripheral Arterial Disease Assessment (using hand held doppler) ASSESSMENTS - Ostomy and/or Continence Assessment and Care _7  - Incontinence Assessment and Management 0 _8  - 0 Ostomy Care Assessment and Management (repouching, etc.) PROCESS - Coordination of Care X - Simple Patient / Family Education for ongoing care 1 15 _9  - 0 Complex (extensive) Patient / Family Education for ongoing care _10  - 0 Staff obtains Programmer, systems, Records, Test Results / Process Orders _11  - 0 Staff telephones HHA, Nursing Homes / Clarify orders / etc _12  - 0 Routine Transfer to another Facility (non-emergent condition) _13  - 0 Routine Hospital Admission (non-emergent condition) _14  - 0 New Admissions / Biomedical engineer / Ordering NPWT, Apligraf, etc. _15  - 0 Emergency Hospital Admission (emergent condition) X- 1 10 Simple Discharge Coordination _16  - 0 Complex (extensive) Discharge Coordination PROCESS - Special Needs _17  - Pediatric / Minor Patient Management 0 _18  - 0 Isolation Patient Management _19  - 0 Hearing / Language / Visual special  needs _20  - 0 Assessment of Community assistance (transportation, D/C planning, etc.) _21  - 0 Additional assistance / Altered mentation _22  - 0 Support Surface(s) Assessment (bed, cushion, seat, etc.) INTERVENTIONS - Wound Cleansing /  Measurement [place], [name] ([id number]) _0  - 0 Simple Wound Cleansing - one wound _1  - 0 Complex Wound Cleansing - multiple wounds _2  - 0 Wound Imaging (photographs - any number of wounds) _3  - 0 Wound Tracing (instead of photographs) _4  - 0 Simple Wound Measurement - one wound _5  - 0 Complex Wound Measurement - multiple wounds INTERVENTIONS - Wound Dressings _6  - Small Wound Dressing one or multiple wounds 0 _7  - 0 Medium Wound Dressing one or multiple wounds _8  - 0 Large Wound Dressing one or multiple wounds <NLZJQBHALPFXTKWI>_0<\/XBDZHGDJMEQASTMH>_9  - 0 Application of Medications - topical <QQIWLNLGXQJJHERD>_4<\/YCXKGYJEHUDJSHFW>_26  - 0 Application of Medications - injection INTERVENTIONS - Miscellaneous _11  - External ear exam 0 _12  - 0 Specimen Collection (cultures, biopsies, blood, body fluids, etc.) _13  - 0 Specimen(s) / Culture(s) sent or taken to Lab for analysis _14  - 0 Patient Transfer (multiple staff / Civil Service fast streamer / Similar devices) _15  - 0 Simple Staple / Suture removal (25 or less) _16  - 0 Complex Staple / Suture removal (26 or more) _17  - 0 Hypo / Hyperglycemic Management (close monitor of Blood Glucose) _18  - 0 Ankle / Brachial Index (ABI) - do not check if billed separately X- 1 5 Vital Signs Has the patient been seen at the hospital within the last three years: Yes Total Score: 45 Level Of Care: New/Established - Level 2 Electronic Signature(s) Signed: 02/03/2022 12:48:30 PM By: [name] RN Entered By: [name] on 02/01/2022 11:41:56 [name] ([id number]) -------------------------------------------------------------------------------- Encounter Discharge Information Details Patient Name: [name] Date of Service: 02/01/2022 11:00 AM Medical Record Number: [id number] Patient Account  Number: [id number] Date of Birth/Sex: May 10, 1950 (71 y.o. M) Treating RN: [name] Primary Care Akito Boomhower: Fontaine No Other Clinician: Referring Malachi Kinzler: Fontaine No Treating Lashala Laser/Extender: Skipper Cliche in Treatment: 55 Encounter Discharge Information Items Discharge Condition: Stable Ambulatory Status: Cane Discharge Destination: Home Transportation: Private Auto Accompanied By: self Schedule Follow-up Appointment: Yes Clinical Summary of Care: Electronic Signature(s) Signed: 02/01/2022 12:31:55 PM By: [name] RN Entered By: [name] on 02/01/2022 12:31:55 [name] ([id number]) -------------------------------------------------------------------------------- Lower Extremity Assessment Details Patient Name: [name] Date of Service: 02/01/2022 11:00 AM Medical Record Number: [id number] Patient Account Number: [id number] Date of Birth/Sex: [birth year] (71 y.o. M) Treating RN: [name] Primary Care Zaydenn Balaguer: Fontaine No Other Clinician: Referring Tylyn Derwin: Fontaine No Treating Tymber Stallings/Extender: Skipper Cliche in Treatment: 53 Edema Assessment Assessed: [Left: No] [Right: No] Edema: [Left: No] [Right: No] Calf Left: Right: Point of Measurement: 38 cm From Medial Instep 37 cm 38 cm Ankle Left: Right: Point of Measurement: 14 cm From Medial Instep 25 cm 27 cm Vascular Assessment Pulses: Dorsalis Pedis Palpable: [Left:Yes] [Right:Yes] Electronic Signature(s) Signed: 02/01/2022 11:26:32 AM By: [name] RN Entered By: [name] on 02/01/2022 11:26:32 [name] ([id number]) -------------------------------------------------------------------------------- Multi Wound Chart Details Patient Name: [name] Date of Service: 02/01/2022 11:00 AM Medical Record Number: [id number] Patient Account Number: [id number] Date of Birth/Sex: 17-May-1950 (71 y.o. M) Treating RN: [name] Primary Care Frederich Montilla:  Fontaine No Other Clinician: Referring Micha Dosanjh: Fontaine No Treating Moyses Pavey/Extender: Skipper Cliche in Treatment: 53 Vital Signs Height(in): Pulse(bpm): 60 Weight(lbs): 312 Blood Pressure(mmHg): 144/75 Body Mass Index(BMI): Temperature(F): 97.7 Respiratory Rate(breaths/min): 18 Wound Assessments Treatment Notes Electronic Signature(s) Signed: 02/01/2022 11:27:01 AM By: [name] RN Entered By: [name] on 02/01/2022 11:27:01 [name] ([id number]) -------------------------------------------------------------------------------- [place] Details Patient Name: [name] Date of Service: 02/01/2022 11:00 AM Medical Record Number: [id number] Patient Account Number: [id number] Date of Birth/Sex: [birth year] (  71 y.o. M) Treating RN: [name] Primary Care Deshannon Seide: Fontaine No Other Clinician: Referring Kirkland Figg: Fontaine No Treating Shayley Medlin/Extender: Skipper Cliche in Treatment: 33 Active Inactive Soft Tissue Infection Nursing Diagnoses: Impaired tissue integrity Knowledge deficit related to disease process and management Knowledge deficit related to home infection control: handwashing, handling of soiled dressings, supply storage Potential for infection: soft tissue Goals: Patient/caregiver will verbalize understanding of or measures to prevent infection and contamination in the home setting Date Initiated: 09/06/2021 Date Inactivated: 10/18/2021 Target Resolution Date: 09/06/2021 Goal Status: Met Patient's soft tissue infection will resolve Date Initiated: 09/06/2021 Date Inactivated: 10/18/2021 Target Resolution Date: 09/06/2021 Goal Status: Met Signs and symptoms of infection will be recognized early to allow for prompt treatment Date Initiated: 09/06/2021 Target Resolution Date: 02/24/2022 Goal Status: Active Interventions: Assess signs and symptoms of infection every visit Provide education on  infection Treatment Activities: Education provided on Infection : 01/25/2022 Systemic antibiotics : 09/06/2021 Notes: Electronic Signature(s) Signed: 02/01/2022 11:26:44 AM By: [name] RN Entered By: [name] on 02/01/2022 11:26:43 [name] ([id number]) -------------------------------------------------------------------------------- Pain Assessment Details Patient Name: [name] Date of Service: 02/01/2022 11:00 AM Medical Record Number: [id number] Patient Account Number: [id number] Date of Birth/Sex: 09-Nov-1950 (71 y.o. M) Treating RN: [name] Primary Care Tyshawna Alarid: Fontaine No Other Clinician: Referring Dacia Capers: Fontaine No Treating Jovoni Borkenhagen/Extender: Skipper Cliche in Treatment: 5 Active Problems Location of Pain Severity and Description of Pain Patient Has Paino No Site Locations Pain Management and Medication Current Pain Management: Electronic Signature(s) Signed: 02/03/2022 12:48:30 PM By: [name] RN Entered By: [name] on 02/01/2022 11:01:58 [name] ([id number]) -------------------------------------------------------------------------------- Patient/Caregiver Education Details Patient Name: [name] Date of Service: 02/01/2022 11:00 AM Medical Record Number: [id number] Patient Account Number: [id number] Date of Birth/Gender: 06-30-1950 (71 y.o. M) Treating RN: [name] Primary Care Physician: Fontaine No Other Clinician: Referring Physician: Fontaine No Treating Physician/Extender: Skipper Cliche in Treatment: 36 Education Assessment Education Provided To: Patient Education Topics Provided Infection: Methods: Explain/Verbal Responses: State content correctly Electronic Signature(s) Signed: 02/03/2022 12:48:30 PM By: [name] RN Entered By: [name] on 02/01/2022 11:42:15 [name]  ([id number]) -------------------------------------------------------------------------------- [place] Details Patient Name: [name] Date of Service: 02/01/2022 11:00 AM Medical Record Number: [id number] Patient Account Number: [id number] Date of Birth/Sex: 03/07/1951 (71 y.o. M) Treating RN: [name] Primary Care Zakai Gonyea: Fontaine No Other Clinician: Referring Jaelene Garciagarcia: Fontaine No Treating Macrina Lehnert/Extender: Skipper Cliche in Treatment: 55 Vital Signs Time Taken: 11:01 Temperature (F): 97.7 Weight (lbs): 312 Pulse (bpm): 60 Respiratory Rate (breaths/min): 18 Blood Pressure (mmHg): 144/75 Reference Range: 80 - 120 mg / dl Electronic Signature(s) Signed: 02/03/2022 12:48:30 PM By: [name] RN Entered By: [name] on 02/01/2022 11:01:50

## 2022-02-08 ENCOUNTER — Encounter: Payer: No Typology Code available for payment source | Attending: Physician Assistant | Admitting: Physician Assistant

## 2022-02-08 DIAGNOSIS — E1142 Type 2 diabetes mellitus with diabetic polyneuropathy: Secondary | ICD-10-CM | POA: Insufficient documentation

## 2022-02-08 DIAGNOSIS — L97822 Non-pressure chronic ulcer of other part of left lower leg with fat layer exposed: Secondary | ICD-10-CM | POA: Insufficient documentation

## 2022-02-08 DIAGNOSIS — I739 Peripheral vascular disease, unspecified: Secondary | ICD-10-CM | POA: Diagnosis not present

## 2022-02-08 DIAGNOSIS — I872 Venous insufficiency (chronic) (peripheral): Secondary | ICD-10-CM | POA: Insufficient documentation

## 2022-02-08 DIAGNOSIS — Z856 Personal history of leukemia: Secondary | ICD-10-CM | POA: Insufficient documentation

## 2022-02-08 DIAGNOSIS — E11622 Type 2 diabetes mellitus with other skin ulcer: Secondary | ICD-10-CM | POA: Diagnosis present

## 2022-02-08 DIAGNOSIS — L97512 Non-pressure chronic ulcer of other part of right foot with fat layer exposed: Secondary | ICD-10-CM | POA: Diagnosis not present

## 2022-02-08 DIAGNOSIS — Z79899 Other long term (current) drug therapy: Secondary | ICD-10-CM | POA: Insufficient documentation

## 2022-02-08 DIAGNOSIS — I5040 Unspecified combined systolic (congestive) and diastolic (congestive) heart failure: Secondary | ICD-10-CM | POA: Insufficient documentation

## 2022-02-08 DIAGNOSIS — I110 Hypertensive heart disease with heart failure: Secondary | ICD-10-CM | POA: Diagnosis not present

## 2022-02-08 DIAGNOSIS — E669 Obesity, unspecified: Secondary | ICD-10-CM | POA: Insufficient documentation

## 2022-02-08 DIAGNOSIS — I890 Lymphedema, not elsewhere classified: Secondary | ICD-10-CM | POA: Insufficient documentation

## 2022-02-08 DIAGNOSIS — L97811 Non-pressure chronic ulcer of other part of right lower leg limited to breakdown of skin: Secondary | ICD-10-CM | POA: Insufficient documentation

## 2022-02-08 NOTE — Progress Notes (Signed)
BURNIS, HALLING ([id number]) Visit Report for 02/08/2022 Chief Complaint Document Details Patient Name: [name], [name] Date of Service: 02/08/2022 11:00 AM Medical Record Number: [id number] Patient Account Number: [id number] Date of Birth/Sex: 1950-06-27 (71 y.o. M) Treating RN: [name] Primary Care Provider: Fontaine No Other Clinician: Referring Provider: Fontaine No Treating Provider/Extender: Skipper Cliche in Treatment: 99 Information Obtained from: Patient Chief Complaint Left LE ulcers Electronic Signature(s) Signed: 02/08/2022 11:21:27 AM By: Worthy Keeler PA-C Entered By: Worthy Keeler on 02/08/2022 11:21:27 [name] ([id number]) -------------------------------------------------------------------------------- Problem List Details Patient Name: [name] Date of Service: 02/08/2022 11:00 AM Medical Record Number: [id number] Patient Account Number: [id number] Date of Birth/Sex: Oct 19, 1950 (71 y.o. M) Treating RN: [name] Primary Care Provider: Fontaine No Other Clinician: Referring Provider: Fontaine No Treating Provider/Extender: Skipper Cliche in Treatment: 57 Active Problems ICD-10 Encounter Code Description Active Date MDM Diagnosis E11.622 Type 2 diabetes mellitus with other skin ulcer 01/26/2021 No Yes I89.0 Lymphedema, not elsewhere classified 01/26/2021 No Yes I87.2 Venous insufficiency (chronic) (peripheral) 01/26/2021 No Yes L97.822 Non-pressure chronic ulcer of other part of left lower leg with fat layer 01/26/2021 No Yes exposed L97.512 Non-pressure chronic ulcer of other part of right foot with fat layer 01/26/2021 No Yes exposed [place] (primary) hypertension 01/26/2021 No Yes L97.811 Non-pressure chronic ulcer of other part of right lower leg limited to 02/15/2021 No Yes breakdown of skin Inactive Problems Resolved Problems Electronic Signature(s) Signed: 02/08/2022 11:21:21 AM By: Worthy Keeler  PA-C Entered By: Worthy Keeler on 02/08/2022 11:21:21

## 2022-02-08 NOTE — Progress Notes (Addendum)
GRAHM, ETSITTY ([id number]) Visit Report for 02/08/2022 Arrival Information Details Patient Name: [name], [name] Date of Service: 02/08/2022 11:00 AM Medical Record Number: [id number] Patient Account Number: [id number] Date of Birth/Sex: 02/14/1951 (71 y.o. M) Treating RN: [name] Primary Care Denai Caba: Fontaine No Other Clinician: Referring Ichael Pullara: Fontaine No Treating Jaymison Luber/Extender: Skipper Cliche in Treatment: 63 Visit Information History Since Last Visit All ordered tests and consults were completed: No Patient Arrived: [name] Added or deleted any medications: No Arrival Time: 11:12 Any new allergies or adverse reactions: No Accompanied By: cousin Had a fall or experienced change in No Transfer Assistance: None activities of daily living that may affect Patient Identification Verified: Yes risk of falls: Secondary Verification Process Completed: Yes Signs or symptoms of abuse/neglect since last visito No Patient Requires Transmission-Based No Hospitalized since last visit: No Precautions: Implantable device outside of the clinic excluding No Patient Has Alerts: Yes cellular tissue based products placed in the center Patient Alerts: AVVS consult on file since last visit: Last ABI-R 1.09; L Has Compression in Place as Prescribed: Yes 1.04 Pain Present Now: No Electronic Signature(s) Signed: 02/09/2022 11:46:40 AM By: [name] RN Entered By: [name] on 02/08/2022 11:35:07 [name] ([id number]) -------------------------------------------------------------------------------- Clinic Level of Care Assessment Details Patient Name: [name] Date of Service: 02/08/2022 11:00 AM Medical Record Number: [id number] Patient Account Number: [id number] Date of Birth/Sex: 05/01/1951 (71 y.o. M) Treating RN: [name] Primary Care Maxon Kresse: Fontaine No Other Clinician: Referring Missie Gehrig: Fontaine No Treating  Darragh Nay/Extender: Skipper Cliche in Treatment: 67 Clinic Level of Care Assessment Items TOOL 4 Quantity Score X - Use when only an EandM is performed on FOLLOW-UP visit 1 0 ASSESSMENTS - Nursing Assessment / Reassessment X - Reassessment of Co-morbidities (includes updates in patient status) 1 10 X- 1 5 Reassessment of Adherence to Treatment Plan ASSESSMENTS - Wound and Skin Assessment / Reassessment _0  - Simple Wound Assessment / Reassessment - one wound 0 _1  - 0 Complex Wound Assessment / Reassessment - multiple wounds _2  - 0 Dermatologic / Skin Assessment (not related to wound area) ASSESSMENTS - Focused Assessment _3  - Circumferential Edema Measurements - multi extremities 0 _4  - 0 Nutritional Assessment / Counseling / Intervention _5  - 0 Lower Extremity Assessment (monofilament, tuning fork, pulses) _6  - 0 Peripheral Arterial Disease Assessment (using hand held doppler) ASSESSMENTS - Ostomy and/or Continence Assessment and Care _7  - Incontinence Assessment and Management 0 _8  - 0 Ostomy Care Assessment and Management (repouching, etc.) PROCESS - Coordination of Care X - Simple Patient / Family Education for ongoing care 1 15 _9  - 0 Complex (extensive) Patient / Family Education for ongoing care _10  - 0 Staff obtains Programmer, systems, Records, Test Results / Process Orders _11  - 0 Staff telephones HHA, Nursing Homes / Clarify orders / etc _12  - 0 Routine Transfer to another Facility (non-emergent condition) _13  - 0 Routine Hospital Admission (non-emergent condition) _14  - 0 New Admissions / Biomedical engineer / Ordering NPWT, Apligraf, etc. _15  - 0 Emergency Hospital Admission (emergent condition) X- 1 10 Simple Discharge Coordination _16  - 0 Complex (extensive) Discharge Coordination PROCESS - Special Needs _17  - Pediatric / Minor Patient Management 0 _18  - 0 Isolation Patient Management _19  - 0 Hearing / Language / Visual special needs _20  - 0 Assessment of  Community assistance (transportation, D/C planning, etc.) _21  - 0 Additional assistance / Altered mentation _22  - 0 Support Surface(s) Assessment (bed, cushion, seat, etc.) INTERVENTIONS - Wound Cleansing / Measurement [place], Alfonse ([id number]) _23  -  0 Simple Wound Cleansing - one wound _0  - 0 Complex Wound Cleansing - multiple wounds _1  - 0 Wound Imaging (photographs - any number of wounds) _2  - 0 Wound Tracing (instead of photographs) _3  - 0 Simple Wound Measurement - one wound _4  - 0 Complex Wound Measurement - multiple wounds INTERVENTIONS - Wound Dressings _5  - Small Wound Dressing one or multiple wounds 0 _6  - 0 Medium Wound Dressing one or multiple wounds _7  - 0 Large Wound Dressing one or multiple wounds <UXNATFTDDUKGURKY>_7<\/CWCBJSEGBTDVVOHY>_0  - 0 Application of Medications - topical <VPXTGGYIRSWNIOEV>_0<\/JJKKXFGHWEXHBZJI>_9  - 0 Application of Medications - injection INTERVENTIONS - Miscellaneous _10  - External ear exam 0 _11  - 0 Specimen Collection (cultures, biopsies, blood, body fluids, etc.) _12  - 0 Specimen(s) / Culture(s) sent or taken to Lab for analysis _13  - 0 Patient Transfer (multiple staff / Civil Service fast streamer / Similar devices) _14  - 0 Simple Staple / Suture removal (25 or less) _15  - 0 Complex Staple / Suture removal (26 or more) _16  - 0 Hypo / Hyperglycemic Management (close monitor of Blood Glucose) _17  - 0 Ankle / Brachial Index (ABI) - do not check if billed separately X- 1 5 Vital Signs Has the patient been seen at the hospital within the last three years: Yes Total Score: 45 Level Of Care: New/Established - Level 2 Electronic Signature(s) Signed: 02/09/2022 11:46:40 AM By: [name] RN Entered By: [name] on 02/08/2022 12:36:57 [name] ([id number]) -------------------------------------------------------------------------------- Encounter Discharge Information Details Patient Name: [name] Date of Service: 02/08/2022 11:00 AM Medical Record Number: [id number] Patient Account Number: [id number] Date of  Birth/Sex: [birth year] (71 y.o. M) Treating RN: [name] Primary Care Zacharee Gaddie: Fontaine No Other Clinician: Referring [name] Iglesia: Fontaine No Treating Alice Burnside/Extender: Skipper Cliche in Treatment: 71 Encounter Discharge Information Items Discharge Condition: Stable Ambulatory Status: Cane Discharge Destination: Home Transportation: Private Auto Accompanied By: cousin Schedule Follow-up Appointment: Yes Clinical Summary of Care: Electronic Signature(s) Signed: 02/09/2022 11:46:40 AM By: [name] RN Entered By: [name] on 02/08/2022 12:37:45 [name] ([id number]) -------------------------------------------------------------------------------- Lower Extremity Assessment Details Patient Name: [name] Date of Service: 02/08/2022 11:00 AM Medical Record Number: [id number] Patient Account Number: [id number] Date of Birth/Sex: 02-Aug-1950 (71 y.o. M) Treating RN: [name] Primary Care Ashad Fawbush: Fontaine No Other Clinician: Referring Lilliane Sposito: Fontaine No Treating Lindzey Zent/Extender: Skipper Cliche in Treatment: 54 Edema Assessment Assessed: [Left: No] [Right: No] Edema: [Left: Yes] [Right: Yes] Calf Left: Right: Point of Measurement: 38 cm From Medial Instep 37 cm 38 cm Ankle Left: Right: Point of Measurement: 14 cm From Medial Instep 25 cm 27 cm Vascular Assessment Pulses: Dorsalis Pedis Palpable: [Left:Yes] [Right:Yes] Electronic Signature(s) Signed: 02/09/2022 11:46:40 AM By: [name] RN Entered By: [name] on 02/08/2022 11:36:10 [name] ([id number]) -------------------------------------------------------------------------------- Multi Wound Chart Details Patient Name: [name] Date of Service: 02/08/2022 11:00 AM Medical Record Number: [id number] Patient Account Number: [id number] Date of Birth/Sex: [birth year] (71 y.o. M) Treating RN: [name] Primary Care Tamrah Victorino: Fontaine No  Other Clinician: Referring Katai Marsico: Fontaine No Treating Adalyna Godbee/Extender: Skipper Cliche in Treatment: 60 Vital Signs Height(in): Pulse(bpm): 56 Weight(lbs): 312 Blood Pressure(mmHg): 146/80 Body Mass Index(BMI): Temperature(F): 97.8 Respiratory Rate(breaths/min): 18 Wound Assessments Treatment Notes Electronic Signature(s) Signed: 02/09/2022 11:46:40 AM By: [name] RN Entered By: [name] on 02/08/2022 12:36:09 [name] ([id number]) -------------------------------------------------------------------------------- [place] Details Patient Name: [name] Date of Service: 02/08/2022 11:00 AM Medical Record Number: [id number] Patient Account Number: [id number] Date of Birth/Sex: Oct 02, 1950 (71 y.o. M) Treating RN: [name],  Morey Hummingbird Primary Care Briscoe Daniello: Fontaine No Other Clinician: Referring Vonzell Lindblad: Fontaine No Treating Parris Signer/Extender: Skipper Cliche in Treatment: 29 Active Inactive Soft Tissue Infection Nursing Diagnoses: Impaired tissue integrity Knowledge deficit related to disease process and management Knowledge deficit related to home infection control: handwashing, handling of soiled dressings, supply storage Potential for infection: soft tissue Goals: Patient/caregiver will verbalize understanding of or measures to prevent infection and contamination in the home setting Date Initiated: 09/06/2021 Date Inactivated: 10/18/2021 Target Resolution Date: 09/06/2021 Goal Status: Met Patient's soft tissue infection will resolve Date Initiated: 09/06/2021 Date Inactivated: 10/18/2021 Target Resolution Date: 09/06/2021 Goal Status: Met Signs and symptoms of infection will be recognized early to allow for prompt treatment Date Initiated: 09/06/2021 Target Resolution Date: 02/24/2022 Goal Status: Active Interventions: Assess signs and symptoms of infection every visit Provide education on infection Treatment  Activities: Education provided on Infection : 02/01/2022 Systemic antibiotics : 09/06/2021 Notes: Electronic Signature(s) Signed: 02/09/2022 11:46:40 AM By: [name] RN Entered By: [name] on 02/08/2022 12:36:02 [name] ([id number]) -------------------------------------------------------------------------------- Pain Assessment Details Patient Name: [name] Date of Service: 02/08/2022 11:00 AM Medical Record Number: [id number] Patient Account Number: [id number] Date of Birth/Sex: 1951/05/08 (71 y.o. M) Treating RN: [name] Primary Care Dontavius Keim: Fontaine No Other Clinician: Referring Bailei Buist: Fontaine No Treating Ayoub Arey/Extender: Skipper Cliche in Treatment: 36 Active Problems Location of Pain Severity and Description of Pain Patient Has Paino No Site Locations Pain Management and Medication Current Pain Management: Electronic Signature(s) Signed: 02/09/2022 11:46:40 AM By: [name] RN Entered By: [name] on 02/08/2022 11:35:34 [name] ([id number]) -------------------------------------------------------------------------------- Patient/Caregiver Education Details Patient Name: [name] Date of Service: 02/08/2022 11:00 AM Medical Record Number: [id number] Patient Account Number: [id number] Date of Birth/Gender: 12/29/1950 (71 y.o. M) Treating RN: [name] Primary Care Physician: Fontaine No Other Clinician: Referring Physician: Fontaine No Treating Physician/Extender: Skipper Cliche in Treatment: 66 Education Assessment Education Provided To: Patient Education Topics Provided Infection: Methods: Explain/Verbal Responses: State content correctly Electronic Signature(s) Signed: 02/09/2022 11:46:40 AM By: [name] RN Entered By: [name] on 02/08/2022 12:37:11 [name] ([id number]) -------------------------------------------------------------------------------- [place]  Details Patient Name: [name] Date of Service: 02/08/2022 11:00 AM Medical Record Number: [id number] Patient Account Number: [id number] Date of Birth/Sex: [birth year] (71 y.o. M) Treating RN: [name] Primary Care Sentoria Brent: Fontaine No Other Clinician: Referring Nhung Danko: Fontaine No Treating Reeya Bound/Extender: Skipper Cliche in Treatment: 25 Vital Signs Time Taken: 11:15 Temperature (F): 97.8 Weight (lbs): 312 Pulse (bpm): 56 Respiratory Rate (breaths/min): 18 Blood Pressure (mmHg): 146/80 Reference Range: 80 - 120 mg / dl Electronic Signature(s) Signed: 02/09/2022 11:46:40 AM By: [name] RN Entered By: [name] on 02/08/2022 11:35:27

## 2022-02-15 ENCOUNTER — Ambulatory Visit: Payer: No Typology Code available for payment source | Admitting: Physician Assistant

## 2022-02-22 ENCOUNTER — Encounter: Payer: No Typology Code available for payment source | Admitting: Physician Assistant

## 2022-02-22 DIAGNOSIS — E11622 Type 2 diabetes mellitus with other skin ulcer: Secondary | ICD-10-CM | POA: Diagnosis not present

## 2022-02-22 NOTE — Progress Notes (Addendum)
[name], [name] ([id number]) [id number] Page 1 of 15 Visit Report for 02/22/2022 Chief Complaint Document Details Patient Name: Date of Service: [name] [facility] 02/22/2022 11:00 A M Medical Record Number: [id number] Patient Account Number: [id number] Date of Birth/Sex: Treating RN: 03/08/1951 (71 y.o. [name]) [name] Primary Care Provider: Fontaine No Other Clinician: Referring Provider: Treating Provider/Extender: Leanora Ivanoff in Treatment: 59 Information Obtained from: Patient Chief Complaint Left LE ulcers Electronic Signature(s) Signed: 02/22/2022 11:07:29 AM By: Worthy Keeler PA-C Entered By: Worthy Keeler on 02/22/2022 11:07:29 -------------------------------------------------------------------------------- HPI Details Patient Name: Date of Service: [name], [place] 02/22/2022 11:00 [place] Record Number: [id number] Patient Account Number: [id number] Date of Birth/Sex: Treating RN: [birth year] (71 y.o. [name] Primary Care Provider: Fontaine No Other Clinician: Referring Provider: Treating Provider/Extender: Leanora Ivanoff in Treatment: 34 History of Present Illness HPI Description: 71 year old male who presented to the ER with bilateral lower extremity blisters which had started last week. he has a past medical history of leukemia, diabetes mellitus, hypertension, edema of both lower extremities, his recurrent skin infections, peripheral vascular disease, coronary artery disease, congestive heart failure and peripheral neuropathy. in the ER he was given Rocephin and put on Silvadene cream. he was put on oral doxycycline and was asked to follow-up with the [facility]. His last hemoglobin A1c was 6.6 in December and he checks his blood sugar once a week. He does not have any physicians outside the [place] system. He does not recall any vascular duplex studies done either for  arterial or venous disease but was told to wear compression stockings which he does not use 05/30/2016 -- we have not yet received any of his notes from the [facility] hospital system and his arterial and venous duplex studies are scheduled here in [place] around mid February. We are unable to have his insurance accepted by home health agencies and hence he is getting dressings only once a week. 06/06/16 -- -- I received a call from the patient's PCP at the [facility] at [facility] and spoke to Dr. Garvin Fila, phone number [phone] and fax number [phone]. She confirmed that no vascular testing was done over the last 5 years and she would be happy to do them if the patient did want them to be done at the [place] and we could fax him a request. Readmission: 71 year old male seen by as in February of this year and was referred to vein and vascular for studies and opinion from the vascular surgeons. The patient returns today with a fresh problem having had blisters on his left lower extremity which have been there for about 5 days and he clearly states that [name], [name] ([id number]) [id number] Page 2 of 15 he has been wearing his compression stockings as advised though he could not read the moderate compression and has been wearing light compression. Review of his electronic medical records note that he had lower extremity arterial duplex examination done on 06/23/2016 which showed no hemodynamically significant stenosis in the bilateral lower extremity arterial system. He also had a lower extremity venous reflux examination done on 07/07/2016 and it was noted that he had venous incompetence in the right great saphenous vein and bilateral common femoral veins. Patient was seen by Dr. Tamala Julian on the same day and for some reason his notes do not reflect the venous studies or the arterial studies and he recommended patient do a venous duplex ultrasound to look for reflux and  return to  see him.he would also consider a lymph pump if required. The patient was told that his workup was normal and hence the patient canceled his follow-up appointment. 02/03/17 on evaluation today patient left medial lower extremity blister appears to be doing about the same. It is still continuing to drain and there's still the blistered skin covering the wound bed which is making it difficult for the alternate to do its job. Fortunately there is no evidence of cellulitis. No fevers chills noted. Patient states in general he is not having any significant discomfort. Patient's lower extremity arterial duplex exam revealed that patient was hemodynamically stable with no evidence of stenosis in regard to the bilateral lower extremities. The lower extremity venous reflux exam revealed the patient had venous incontinence noted in the right greater saphenous and bilateral common femoral vein. There is no evidence of deep or superficial vein thrombosis in the bilateral lower extremities. Readmission: 11/12/18 Patient presents for evaluation our clinic today concerning issues that he is having with his left lower extremity. He tells me that a couple weeks ago he began developing blisters on the left lower extremity along with increased swelling. He typically wears his compression stockings on a regular basis is previously been evaluated both here as well is with vascular surgery they would recommend lymphedema pumps but unfortunately that somehow fell through and he never heard anything back from that. Nonetheless I think lymphedema pumps would be beneficial for this patient. He does have a history of hypertension and diabetes. Obviously the chronic venous stasis and lymphedema as well. At this point the blisters have been given in more trouble he states sometimes when the blisters openings able to clean it down with alcohol and it will dry out and do well. Unfortunately that has not been the case this time. He  is having some discomfort although this mean these with cleaning the areas he doesn't have discomfort just on a regular basis. He has not been able to wear his compression stockings since the blisters arose due to the fact that of course it will drain into the socks causing additional issues and he didn't have any way to wrap this otherwise. He has increased to taking his Lasix every day instead of every other day. He sees his primary care provider later this month as well. No fevers, chills, nausea, or vomiting noted at this time. 11/19/18-Patient returns at 1 week, per intake RN the amount of seepage into the compression wraps was definitely improved, overall all the wounds are measuring smaller but continuing silver alginate to the wounds as primary dressing 11/26/18 on evaluation today patient appears to be doing quite well in regard to his left lower [place] ulcers. In fact of the areas that were noted initially he only has two regions still open. There is no evidence of active infection at this time. He still is not heard anything from the company regarding lymphedema pumps as of yet. Again as previously seen vascular they have not recommended any surgical intervention. 12/03/2018 on evaluation today patient actually appears to be doing quite well with regard to his lower extremity ulcers. In fact most of the areas appear to be healed the one spot which does not seem to be completely healed I am unsure of whether or not this is really draining that much but nonetheless there does not appear to be any signs of infection or significant drainage at this point. There is no sign of fever, chills, nausea, vomiting, or diarrhea.  Overall I am pleased with how things have progressed I think is very close to being able to transition to his home compression stockings. 12/10/2018 upon evaluation today patient appears to be doing quite well with regard to his left lower extremity. He has been tolerating the  dressing changes without complication. Fortunately there is no signs of active infection at this time. He appears after thorough evaluation of his leg to only have 1 small area that remains open at this point everything else appears to be almost completely closed. He still have significant swelling of the left lower extremity. We had discussed discussing this with his primary care provider he is not able to see her in person they were at the [facility] and right now the [place] is not seeing patients on site. According to the patient anyway. Subsequently he did speak with her apparently and his primary care provider feels that he may likely have a DVT With that being said she has not seen his leg she is just going off of his history. Nonetheless that is a concern that the patient now has as well and while I . do not feel the DVT is likely we can definitely ensure that that is not the case I will go ahead and see about putting that order in today. Nonetheless otherwise I am in a recommend that we continue with the current wound care measures including the compression therapy most likely. We just need to ensure that his leg is indeed free of any DVT s. 12/17/2018 on evaluation today patient actually appears to be completely healed today. He does have 2 very small areas of blistering although this is not anything too significant at this point which is good news. With that being said I am in agreement with the fact that I think he is completely healed at this point. He does want to get back into his compression stocking. The good news is we have gotten approval from insurance for his lymphedema pumps we received a letter since last saw him last week. The other good news is his study did come back and showed no evidence of a DVT . 12/20/2018 on evaluation today patient presents for follow-up concerning his ongoing issues with his left lower extremity. He was actually discharged last Friday and did fairly well  until he states blisters opened this morning. He tells me he has been wearing his compression stocking although he has a hard time getting this on. There does not appear to be any signs of active infection at this time. No fevers, chills, nausea, vomiting, or diarrhea. 12/27/2018 on evaluation today patient appears to be doing very well with regard to his swelling of the left lower extremity the 4 layer compression wrap seems to have been beneficial for him. Fortunately there is no signs of active infection at this time. Patient has been tolerating the compression wrap without complication and his foot swelling in particular appears to be greatly improved. He does still have a wound on the lateral portion of his left leg I believe this is more of a blister that has now reopened. 01/03/2019 on evaluation today patient actually appears to be doing excellent in regard to his left lower extremity. He did receive his compression pumps and is actually use this 7 times since he was last here in the office. On top of the compression wrap he is now roughly 3 cm better at the calf and 2 cm better at the ankle he also states  that his foot seem to go an issue better without even having to use a shoe horn. Obviously I think this is all evidence that he is doing excellent in this regard. The other good news is he does not appear to have anything open today as far as wounds are concerned. 01/15/2019 on evaluation today patient appears to be doing more poorly yet again with regard to his left lower extremity. He has developed new wounds again after being discharged just recently. Unfortunately this continues to be the case that he will heal and then have subsequent new wounds. The last time I was hopeful that he may not end up coming back too quickly especially since he states he has been using his lymphedema pumps along with wearing his compression. Nonetheless he had a blister on the back of his leg that popped up on the  left and this has opened up into an ulceration it is quite painful. 01/22/19 on evaluation today patient actually appears to be doing well with regard to his wound on the left lower extremity. He's been tolerating the dressing changes without complication including the compression wrap in the wound appears to be significantly smaller today which is great news. Overall very pleased in this regard. 01/29/2019 on evaluation today patient appears to be doing well with regard to his left posterior lower extremity ulcer. He has been tolerating the dressing changes without complication. This is not completely healed but is getting much closer. We did order a Farrow wrap 4000 for him he has received this and has it with him today although I am not sure we are quite ready to start him on that as of yet. We are very close. 02/05/2019 on evaluation today patient actually appears to be doing quite well with regard to his left posterior lower extremity ulcer. He still has a very tiny opening remaining but the fortunate thing is he seems to be healing quite nicely. He also did get his Farrow wrap which I am hoping will help with his edema control as well at home. Fortunately there is no evidence of active infection. 02/12/2019 patient and fortunately appears to be doing poorly in regard to his wounds of the left lower extremity. He was very close to healing therefore we attempted to use his Velcro compression wraps continuing with lymphedema pumps at home. Unfortunately that does not seem to have done very well for him. He tells me that he wore them all the time but again I am not sure why if that is the case that he is having such significant edema. He is still on his fluid EMRICK, HENSCH ([id number]) [id number] Page 3 of 15 pills as well. With that being said there is no obvious sign of infection although I do wonder about the possibility of infection at this time as well. 02/19/2019  unfortunately upon evaluation today patient appears to be doing more poorly with regard to his left lower extremity. He is not showing signs of significant improvement and I think the biggest issue here is that he does have an infection that appears to likely be Pseudomonas. That is based on the blue- green drainage that were noted at this time. Unfortunately the antibiotic that has been on is not going to take care of this at all. I think they will get a need to switch him to either Levaquin or Cipro and this was discussed with the patient. 02/26/2019 on evaluation today patient's lower extremity on the left appears to be doing  significantly better as compared to last evaluation. Fortunately there is no signs of active infection at this time. He has been tolerating the compression wrap without complication in fact he made it the whole week at this point. He is showing signs of excellent improvement I am very happy in this regard. With that being said he is having some issues with infection we did review the results of his culture which I noted today. He did have a positive finding for Enterobacter as well as Alcaligenes faecalis. Fortunately the Levaquin that I placed him on will work for both which is great news. There is no signs of systemic infection at this point. 10/30; left posterior leg wound in the setting of very significant edema and what looks like chronic venous inflammation. He has compression pumps but does not use them. We have been using 3 layer compression. Silver alginate to the wound as the primary dressing 03/18/2019 on evaluation today patient appears to be doing a little better compared to last time I saw him. He really has not been using his compression pumps he tells me that he is having too much discomfort. He has been keeping his wraps on however. He is only been taking his fluid pills every other day because he states they are not really helping and he has an appointment with  his primary care provider at the [facility] tomorrow. Subsequently the wound itself on the left lower extremity does seem to be greatly improved compared to previous. 03/25/2019 on evaluation today patient appears to be doing better with regard to his wounds on the bilateral lower extremities. The left is doing excellent the right is also doing better although both still do show some signs of open wounds noted at this point unfortunately. Fortunately there is no signs of active infection at this time. The patient also is not really having any significant pain which is good news. Unfortunately there was some confusion with the referral on vascular disease and as far as getting the patient scheduled there can be contacting him later today to do this fortunately we got this straightened out. 04/01/2019 on evaluation today patient appears to be doing no fevers, chills, nausea, vomiting, or diarrhea. Excellent at this time with regard to his lower extremities. There does not appear to be any open wound at this point which is good news. Fortunately is also no signs of active infection at this time. Overall feel like the patient has done excellent with the compression the problem is every time we got him to this point and then subsequently go to using his own compression things just go right back to where they were. I am not sure how to address this we can try to get an appointment with vascular for 2 weeks now they have yet to call him. Obviously this has become frustrating for the patient as well. I think the issue has just been an honest error as far as scheduling is concerned but nonetheless still worn out the point where I am unsure of which direction we should take. 04/08/2019 on evaluation today patient actually appears to be doing well with regard to his lower extremities. There are no open wounds at this time and things seem to be managing quite nicely as far as the overall edema control is concerned. With  that being said he does have his compression socks today for [place] to go ahead and reinitiate therapy in that manner at this point. He is going to be going for  shoes to be measured on Wednesday and then coincidentally he will also be seeing vascular on Thursday. Overall I think this is good news and again I am hopeful that they will be able to do something for him to help prevent ongoing issues with edema control as well. No fevers, chills, nausea, vomiting, or diarrhea. 04/11/2019 on evaluation today patient actually appears to be doing poorly after just being discharged on Monday of this week. He had been experiencing issues with again blisters especially on the left lower extremity. With that being said he was completely healed and appeared to be doing great this past Monday. He then subsequently has new blisters that formed before his appointment with vascular this morning. He was also measured for shoes in the interim. With that being said we may have figured out what exactly is going on and why he continues to have issues like what we are seeing at this point. He takes his compression stockings off at nighttime and then he ends up having to sleep in his chair for 5-6 hours a night. He sleeps with his feet down he cannot really get him up in the recliner and therefore he is sleeping and the worst possible his position with his feet on the floor for that majority of the time. Again as I explained to him that is about one third at minimum at least one fourth of his day that he spending with his feet dangling down on the ground and the worst possible position they could be. I think this may be what is causing the issue. Subsequently I am leaning toward thinking that he may need a hospital bed in order to elevate his legs. We likely can have to coordinate this with his primary care provider at the [facility]. Readmission: 01/26/2021 this is a patient who presents for repeat evaluation here in the clinic  although it is actually been couple of years since have seen him in fact it was December 2020 when I last saw him. Subsequently he never really healed but did end up being lost to follow-up. He tells me has been having issues ongoing with his lower extremities has bilateral lower extremity lymphedema no real significant or definitive open wounds but in general his lymphedema is way out of control. We were never able to refer him to lymphedema clinic simply due to the fact to be honest we were never able to get him completely healed. I do not see anyone with open wounds. The patient does have evidence of type 2 diabetes mellitus, lymphedema, chronic venous insufficiency, and hypertension. That really has not changed since his last evaluation. 02/09/2021 upon evaluation today patient appears to be doing a little better in regard to his legs although he still having a tremendous amount of drainage especially on the left leg. Fortunately there does not appear to be any evidence of active infection. Of note when we looked into this further it appears that the patient did not have any absorptive dressing on it was just the 4-layer compression wrap. Nonetheless this is probably big part of the issue here. 10/10; he comes in today with 3 large areas on the upper right lower leg likely remanence of denuded blistering under his compression wraps. He has no other wounds on the right. On the left he has the denuded area on the left medial foot and ankle and on the left dorsal foot. Massive lymphedema in both feet dorsally. Using Zetuvit under compression We have increased home health  visitation to twice a week to change the dressings and will change it once 02/22/2021 upon evaluation today patient appears to be doing well currently with regard to his wounds. He has been tolerating the dressing changes without complication. Fortunately there does not appear to be any evidence of active infection which is great  news. No fevers, chills, nausea, vomiting, or diarrhea. The biggest issue I see currently is that home health is not putting any medicine on the actual wounds before wrapping. 03/01/2021 upon evaluation today the patient's right leg actually appears to be doing quite well which is great news there does not appear to be any evidence of active infection at this time. No fevers, chills, nausea, vomiting, or diarrhea. With that being said the patient is having issues on the left foot where he is having significant drainage is also an ammonia smell he does not have any animals at home and this makes me concerned about a bacteria producing urea as a byproduct. Again the possible common organisms will be E. coli, Proteus, and Enterococcus. All 3 of which can be successfully treated with Levaquin. For that reason I think that this may be a good option for [place] to consider placing him on and I did obtain a culture as well for confirmation sake. 03/08/2021 upon evaluation today patient appears to be doing unfortunately still somewhat poorly in regard to his leg ulcerations. He actually has an area on the right leg where he blistered due to the fact that his wrap slid down and caused an area of pinching on his skin and this has led to a significant issue here. 03/15/2021 upon evaluation today patient unfortunately has not been wrapped appropriately with absorptive dressings nor with the appropriate technique for the third layer of the 4-layer compression wrap. These are issues that we continue to try to address with the home health nurse. Also the absorptive dressing that she had was cut in half and therefore that causes things to leak out it does not actually trap the fluid in regard to the top of the foot overall I think that all these combined are really not seeing things improved significantly here. Fortunately there does not appear to be any signs of significant infection at this time which is good news. He  still is having a tremendous amount of drainage. 03/22/2021 upon evaluation today patient appears to be draining tremendously. He still continues to tell me that he is using his pumps 2 times a day and that coupled with that tells me that he is elevating his legs as well. With that being said all things considered I am really just not seeing the improvement we would expect to see with the 4-layer compression wrap and all the above noted. He in fact had an extremely large Zetuvit dressing on both legs and that they were extremely filled to the max with fluid. This is after just being changed just before the weekend and this is Monday. Nonetheless I am concerned about the fact that there is something going on fairly significant that we cannot get any of this under control and that he is draining this significantly. He supposed be having [place], [name] ([id number]) [id number] Page 4 of 15 an echocardiogram it sounds like scheduling has been an issue for him as far as getting in sooner. Its something to do with needing his cousin to drive him because of where it sat and he cannot drive himself to this appointment either way I really think he needs  to try to see what he can do about making this happen a little sooner. He tells me he will call today. 03/29/2021 on evaluation today patient appears to be doing about the same in regard to his legs. He did get his cardiology appointment moved up to 6 December which is at least good that is better than what it was before mid December. Overall very pleased in that regard. 04/05/2021 upon evaluation today patient unfortunately is still doing fairly poorly. There does not appear to be any signs of active infection at this time. No fevers, chills, nausea, vomiting, or diarrhea. Unfortunately I think until his edema is under control and overall fluid overload there is really not to be much chance that I can do much to get him better. This is  quite unfortunate and frustrating both for myself and the patient to be perfectly honest. Nonetheless I think that he really needs to have a conversation both with his primary care provider as well as cardiologist he sees the PCP on Monday and cardiology on Wednesday of next week. 04/13/2021 upon evaluation today patient appears to be doing poorly in regard to his bilateral lower extremities his left is still worse than the right. With that being said he has a tremendous amount of drainage he did see his primary care provider yesterday there really was not much there to be done from their perspective. He sees cardiology tomorrow. Nonetheless my biggest concern here is simply that if we do not get the edema under control he is going to continue to have drainage and honestly I think at some point he is going to become infected severely that is my main concern. 04/19/2021 upon evaluation today patient appears to be doing poorly still in regard to his legs. Unfortunately there does not appear to be any signs of infection at this point. He does have a tremendous amount of drainage however. We have not seen the results back from the cardiologist and the echocardiogram that was done. It appears that the patient checked out okay as far as that is concerned with regard to ejection fraction though we still have some issues here to be honest with his diastolic function. I am unsure if this is accounting for everything that we are seeing or not. Either way he has a tremendous amount of drainage from his legs that we are just not able to control in the outpatient setting at this point. I have reached out to Dr. Rockey Situ his cardiologist to see once he reviews the sheet if there is anything that he feels like can be done from an outpatient perspective if not then I think the way to go is probably can to be through inpatient admission and diuresis. Otherwise I am not sure how working to get this under control we tried  antibiotics, compression wrapping, and I have told the patient to be elevating his legs I am not sure how much he does of this but either way I think that this is still an ongoing issue nonetheless. 04/26/2021 upon evaluation today patient appears to be doing poorly in regard to his legs. He is having a tremendous amount of fluid at this point which is quite unfortunate. Its to the point that he may have had at least 5 to 10 pounds of fluid in his dressings this morning when they were removed these were changed this Friday. Subsequently I think he needs to go to the ER for further evaluation and treatment I think is probably  can need diuresis possibly even IV antibiotics been on what the blood work looks like but in general I feel like he needs something to get this under control from an outpatient perspective absent of everywhere I can think of and I cannot get this under control with our traditional measures. I think this is going require more so that we can get him better 12/30; this is a patient with severe bilateral lymphedema. He was hospitalized from 04/26/2021 through 04/29/2021 treated for cellulitis in the setting of lower extremity ulcers and lymphedema. After he left the hospital he is apparently seen for nurse visit our staff contacted cardiology and he has been started on Lasix 40 mg. Apparently his legs have less edema. Lab work from 05/04/2021 showed a BUN of 38 and creatinine of 1.59 these are elevated versus previous where his creatinine seems to have been 1.30 on 12/19 his potassium is 4.3. I believe the lab work is being followed by cardiology We have him in a 4-layer wrap. Xeroform on the leg wounds and sit to fit on the Berry damage skin on the left dorsal foot versus right dorsal foot. He has compression pumps but does not use them. We have apparently not yet ordered him compression stockings 05/17/2021 upon evaluation today patient's legs though better than last time I personally  saw him appear to be getting worse compared to where they were previous. Dr. Quentin Cornwall was actually last 1 to see you I have not seen him since 19 December. That was before he went into the ER. Coming out apparently his legs looked also and they still look better but not as good as they were in the past. 1/16; patient with severe bilateral lymphedema. Severe scaled hyperkeratotic skin on the dorsal aspect of his distal left foot and left medial ankle.. On the right side changes are not as bad. He did not have any weeping edema. Our intake nurse was convinced that he is being compliant with compression pumps 1 hour twice a day 05/31/2021 upon evaluation today patient actually appears to be doing a little bit better in my opinion in regard to his feet. I do not see as much drainage and it being just completely wet as it was previous. Fortunately I do not also see any signs of active infection which is great news as well. 06/07/21 Upon inspection patient's wound bed actually showed signs of doing well he is not nearly as weepy and wet as he has been in the past and overall very pleased in that regard. Fortunately I do not see any signs of active infection locally or nor systemically at this time. Which is great news. No fevers, chills, nausea, vomiting, or diarrhea. 06/14/2021 upon evaluation today patient appears to be doing well with regard to his right foot I am pleased in that regard. His left foot is still draining quite a bit despite using lymphedema pumps, 4-layer compression wraps, and he tells me elevating his legs as well. He also has Lasix that he takes twice a day. Nonetheless I believe that this is still good to be an ongoing issue. We have a hard time getting this under control as far as the swelling is concerned. 06/21/2021 upon evaluation today patient appears to be doing decently well in regard to his wounds all things considered. He still has a tremendous amount of drainage and fluid noted at  this point. Fortunately I do not see any signs of active infection locally or systemically at this point which  is great news. Nonetheless I am unsure where to go and how to do this as far as trying to limit his swelling and weeping from his toes in particular. 06/28/2021 upon evaluation patient unfortunately continues to have significant drainage from his feet. We have been keeping him in a compression wrap and despite this he still continues to have extreme fluid issues he seen his cardiologist he is seeing the nephrologist. We really cannot find any way to get this under control when he did well was when he was in the hospital and they got some of the fluid away. But outside of that we are just struggling to achieve the long-term goal of getting this under control and keeping it under control unfortunately. 07/05/2021 upon evaluation today patient appears to be doing poorly in regard to his feet. Unfortunately this continues to be a significant issue and to be honest I am really not certain what to do about it. I referred him to Dr. Randol Kern at [facility] and he does have an appointment although it is 12 April. He also sees his primary care provider on 6 April. He did not want to see Dr. Haynes Kerns until after he saw his PCP that is the reason the appointment so far out. There is really not much I can do in that regard. Nonetheless I do think that we are still continue to have significant lymphedema issues with significant mount of weeping in regard to the feet and again this has just become extremely difficult to manage to be honest I am not sure if there is something else that Dr. Haynes Kerns or someone else could recommend he also will be seeing Dr. Dellia Nims in 2 weeks when I am on vacation and at that time I will see if Dr. Dellia Nims has any ideas about where to go from here in the meantime. 07/12/2021 upon evaluation today patient appears to be doing about as well as can be expected with regard to his feet. He does  actually see his kidney doctor this Friday. He also will be seeing his primary care provider on April 4 and then following that around mid April he will be seeing Dr. Haynes Kerns at [facility] which was a referral made for him. Again my goal is to try to find out some way to fix this and to be perfectly honest we have had some issues with making any good adjustments. When he was in the hospital and greater amounts of Lasix he was able to get this down and it looked much better upon discharge. With that being said right now things just are not doing nearly as good as what they used to be. 3/13; patient presents for follow-up. He has been using his lymphedema pumps over the past week. He reports an increase in his Lasix dose. He has no issues or complaints today. He denies signs of infection. 07/26/2021 upon evaluation today patient appears to be doing better in regard to his feet bilaterally. Both are showing signs of much less drainage which is great news and overall very pleased in that regard. Fortunately there does not appear to be any evidence of active infection locally or systemically at this time. No fevers, chills, nausea, vomiting, or diarrhea. 08/02/2021 upon evaluation today patient appears to be doing well with regard to his feet. Both are showing signs of being drier the right pretty much has not really draining much at all which is great news. The left is not draining anywhere close to his much as  it was during the last evaluation. This is excellent news TADARRIUS, BURCH ([id number]) [id number] Page 5 of 15 and overall very pleased. 08/09/2021 upon evaluation today patient appears to be doing well with regard to his legs the right leg especially showing signs of excellent improvement which is great news I do not see any evidence of active infection locally or systemically which is great. In regard to the left leg he still has some weeping and drainage but nothing as  significant as what it was in the past this is great news. 08-16-2021 upon evaluation today patient appears to actually be doing quite well in my opinion in regard to his feet. This is significantly improved compared to what we previously seen and overall I am extremely pleased in that regard. I do believe that He is actually improving although this is obviously very slow going. 08-23-2021 upon evaluation today patient appears to be doing well currently in regard to his right leg which actually is pretty dry at this point today. Fortunately I do not see any evidence of active infection at this time which is great news. No fevers, chills, nausea, vomiting, or diarrhea. 08-30-2021 upon evaluation today patient appears to be doing well with regard to his lower extremities. The right foot is pretty much completely dry which is great news the left foot though not completely dry seems to be doing decently well. I do not see any signs of active infection locally or systemically which is great news. No fevers, chills, nausea, vomiting, or diarrhea. 09-06-2021 upon evaluation today patient appears to be doing well with regard to his feet in fact now the left foot is almost completely dry as well and I am definitely seeing a lot of significant improvement. With that being said unfortunately he actually appears to have some cellulitis of his right thigh. His toes are also little bit red but this may just be due to the increased swelling. He really is not warm to touch in regard to the toes. 5/8; excellent edema control on the right foot and lower leg there is no open wounds but we continue to put compression on this otherwise this will breakdown. He is using his compression pumps twice a day at home The area that is problematic is on the left dorsal foot some areas that are not fully epithelialized with very dry fissured skin over this area. 09-20-2021 upon evaluation today patient appears to be doing a little bit  worse in regard to swelling at this time. Fortunately I do not see any signs of infection with that being said the wrap was not on quite as well as what I would like to have seen. I do believe that this has caused a little bit of excess swelling and again we need to try to get this under good control. 09-27-2021 upon evaluation today patient appears to be doing awesome in regard to his feet and legs. Everything is measuring smaller the swelling is down and to be perfectly honest I am extremely pleased with the end of his feet especially on the left side and how dry this is today. I do think we are on the right track here. 5/31; patient presents for follow-up. He is using nystatin powder to the feet bilaterally under 4-layer compression. He has no issues or complaints today. He states he is going to the lymphedema clinic tomorrow. Since he has no open wounds on the right lower extremity they will be focused on the side. 10-11-2021 upon evaluation  today patient appears to be doing excellent in regard to his legs. He has been tolerating the dressing changes without complication. Fortunately there does not appear to be any evidence of active infection locally or systemically at this time which is great news. No fevers, chills, nausea, vomiting, or diarrhea. 10-18-2021 upon evaluation today patient appears to be doing well with regard to his legs. He has been tolerating the dressing changes without complication and actually seen in lymphedema clinic now in regard to his right leg were taken care of the left leg. Fortunately I do not see any evidence of active infection locally or systemically at this time. 10-26-2021 upon evaluation today patient's wounds actually are showing signs of doing well in fact he really does not have wounds as much is weeping of the lower extremities. Fortunately I do not see any evidence of active infection locally or systemically which is great news. No fever or chills  noted 11-02-2021 upon evaluation today patient appears to be doing decently well in regard to his legs. Of actually been on the phone quite a bit discussing with [facility] orthotics custom compression for him. I also have been discussing everything with his lymphedema clinic provider as well. T eresa notes that there is really not much more that she can do for him which was noted last week as well. Subsequently I do believe that the patient would benefit from getting custom compression in fact I think that is what he is going require to keep anything under control here. He voiced understanding. 11-08-2021 upon evaluation today patient appears to be doing excellent in regard to his legs and feet. Fortunately I do not see any signs of infection at this time which is great news. No fever or chills noted again I am still been working on trying to figure out what exactly we need to do as far as getting the [place] involved in coverage for custom compression for this patient. I also discussed with him that he does need to have custom shoes which I completely understand. With that being said it does make it difficult thinking about what kind of combination of shoes and compression is going to be best for him for the long-term. Fortunately I do not see any evidence of active infection locally or systemically which is great news. Overall I think you are doing quite well. 11-16-2021 upon evaluation today patient appears to be doing well currently in regard to his legs and feet. Everything is significantly smaller compared to what has been. Fortunately I do not see any evidence of active infection locally or systemically at this time which is great news. 11-23-2021 upon evaluation today patient appears to be doing well with regard to his legs bilaterally. We can actually initiate treatment with a recommendation today to utilize Tubigrip and try to see if we can get this under better control. The patient is in agreement with  the plan. Nonetheless the big question is whether this will be something that he can utilize at home in order to keep his swelling down and allow him to be able to function without having to come into the clinic as frequently. He notes that he would love to get to this point. He does have a cousin who is willing to help him with change out the Tubigrip when necessary. 7/25; the patient does not have any open wounds however his left leg is a lot more swollen than it was last week. His cousin came in who presumably would  be the one to change his juxta lites to practice on doing this for the patient. She apparently [place] in the [place] block. In the meantime he has been using Tubigrip 12-07-2021 upon evaluation today patient appears to be doing well with regard to his legs although we are transitioning to try to get him to use the Tubigrip which again has been a little bit of a setback but not too much overall I think he is still doing well he still able to wear his current shoes which is good news. He does have a cousin who is willing to help him with the Tubigrip and we are training her how to do this as well. She is here today. 12-14-2021 upon evaluation today patient appears to be doing well currently in regard to his right leg which I think is doing excellent with the Tubigrip. With regard to the left leg unfortunately this is not doing nearly as well and will get me to do something to intervene here. I think getting back into a 4-layer wraps can be the way to go short-term T get this back under control and the patient voiced understanding. o 12-21-2021 upon evaluation today patient appears to be tolerating the dressing changes currently without complication the wrap of the left leg is doing well the right leg reason Tubigrip. The wound is looking significantly improved. 12-28-2021 upon evaluation today patient appears to be doing well currently in regard to his wound. He has been tolerating the dressing  changes without complication. Fortunately there is no evidence of active infection locally or systemically at this time. I think he is very close to complete resolution on the left leg and I think we are ready to go back to the Tubigrip. 01-04-2022 upon evaluation today patient appears to be doing okay in regard to his legs of the more swollen because he was rolling and pushing the Tubigrip down because it felt "too tight". With that being said I discussed with the patient today that we really do need to keep this pulled up and if he keeps it pulled up and when it starts to feel tight gets to where he can elevate his legs I think that he can keep this under control. I would suggest if it starts feeling tight that he lay down where he can elevate his legs above heart level to basically help the swelling to go down is much as possible. He voiced understanding. I also told him that is can to be his job to keep these pulled up and that if he is not able to do this I Georgina Peer probably have to refer him elsewhere to get a second opinion as I have pretty much done everything I can try to get his legs under control and keep them under control he is much better than where he used to be and his feet can actually fit in his shoes which is great but in general I just do not know what else to do he has his cousin helping with putting the Tubigrip's on which is DURWOOD, DITTUS ([id number]) [id number] Page 6 of 15 awesome and a great resource for him but again she cannot be there to keep them pulled up and nor is that even her job. 01-11-2022 upon evaluation today patient's legs are looking much better. He actually kept the Tubigrip up this week which has made a tremendous difference and improvement in his overall appearance today. Fortunately I do not see any signs of active infection locally  or systemically at this time. 01-18-2022 upon evaluation today patient appears to be doing well currently  in regard to his legs. If active does not appear to be anything open at this point. This is great news. 01-25-2022 upon evaluation today patient appears to be doing well currently in regard to his legs. Fortunately I do not see any signs of significant open wounds which is great news and overall I do believe that he is doing quite well. I do not see any evidence of active infection locally or systemically at this time which is great news. No fevers, chills, nausea, vomiting, or diarrhea. 02-01-2022 upon evaluation patient actually appears to be doing quite well with just 2 slightly draining areas noted at this point. Fortunately I do not see any evidence of active infection locally or systemically which is great news and overall I am extremely pleased with where we stand today. 02-08-2022 upon evaluation today patient appears to be doing well currently in regard to his legs other than the fact that he has not been pulling the Tubigrip off to keep it in place. We discussed this in great detail today I explained why he is getting need to be diligent in this regard in order to make sure that he continues to see signs of improvement in overall does not backtrack as far as his swelling is concerned. 02-22-2022 upon evaluation today patient actually appears to be doing excellent in fact he is completely healed based on what we see. I do not see any evidence of infection locally or systemically which is great news. No fevers, chills, nausea, vomiting, or diarrhea. Electronic Signature(s) Signed: 02/22/2022 2:37:30 PM By: Worthy Keeler PA-C Entered By: Worthy Keeler on 02/22/2022 14:37:29 -------------------------------------------------------------------------------- Physical Exam Details Patient Name: Date of Service: [place], [place] 02/22/2022 11:00 A M Medical Record Number: [id number] Patient Account Number: [id number] Date of Birth/Sex: Treating RN: [birth year] (71 y.o. [name] Primary  Care Provider: Fontaine No Other Clinician: Referring Provider: Treating Provider/Extender: Leanora Ivanoff in Treatment: 52 Constitutional Obese and well-hydrated in no acute distress. Respiratory normal breathing without difficulty. Psychiatric this patient is able to make decisions and demonstrates good insight into disease process. Alert and Oriented x 3. pleasant and cooperative. Notes Patient's wounds again are all completely closed his legs are doing great and overall I am extremely pleased he has a plan for going forward to continue to keep these under control. His cousin is helping him out. Electronic Signature(s) Signed: 02/22/2022 2:40:25 PM By: Worthy Keeler PA-C Entered By: Worthy Keeler on 02/22/2022 14:40:25 [name] ([id number]) [id number] Page 7 of 15 -------------------------------------------------------------------------------- Physician Orders Details Patient Name: Date of Service: Margaretmary Bayley 02/22/2022 11:00 A M Medical Record Number: [id number] Patient Account Number: [id number] Date of Birth/Sex: Treating RN: April 02, 1951 (71 y.o. [name]) [name] Primary Care Provider: Fontaine No Other Clinician: Referring Provider: Treating Provider/Extender: Leanora Ivanoff in Treatment: 63 Verbal / Phone Orders: No Diagnosis Coding ICD-10 Coding Code Description E11.622 Type 2 diabetes mellitus with other skin ulcer I89.0 Lymphedema, not elsewhere classified I87.2 Venous insufficiency (chronic) (peripheral) L97.822 Non-pressure chronic ulcer of other part of left lower leg with fat layer exposed L97.512 Non-pressure chronic ulcer of other part of right foot with fat layer exposed I10 Essential (primary) hypertension L97.811 Non-pressure chronic ulcer of other part of right lower leg limited to breakdown of skin Discharge From [facility] Discharge from [facility] Treatment Complete - double layer size D bilat just behind toes to 6 inches above knee , double layer size C foot ankles enclose toes to just above ankle scell to any weeping areas , 2 times per week Compression Pump: Use compression pump on left lower extremity for 60 minutes, twice daily. Compression Pump: Use compression pump on right lower extremity for 60 minutes, twice daily. Moisturize legs daily after removing compression garments. Elevate, Exercise Daily and A void Standing for Long Periods of Time. DO YOUR BEST to sleep in the bed at night. DO NOT sleep in your recliner. Long hours of sitting in a recliner leads to swelling of the legs and/or potential wounds on your backside. Electronic Signature(s) Signed: 02/22/2022 11:54:55 AM By: [name] RN Signed: 02/22/2022 3:55:23 PM By: Worthy Keeler PA-C Previous Signature: 02/22/2022 11:52:36 AM Version By: [name] RN Entered By: [name] on 02/22/2022 11:54:54 -------------------------------------------------------------------------------- Problem List Details Patient Name: Date of Service: [name], [place] 02/22/2022 11:00 A M Medical Record Number: [id number] Patient Account Number: [id number] Date of Birth/Sex: Treating RN: 1950-11-19 (71 y.o. [name] Primary Care Provider: Fontaine No Other Clinician: Referring Provider: Treating Provider/Extender: Leanora Ivanoff in Treatment: [address] [place] ([id number]) [id number] Page 8 of 15 Active Problems ICD-10 Encounter Code Description Active Date MDM Diagnosis E11.622 Type 2 diabetes mellitus with other skin ulcer 01/26/2021 No Yes I89.0 Lymphedema, not elsewhere classified 01/26/2021 No Yes I87.2 Venous insufficiency (chronic) (peripheral) 01/26/2021 No Yes L97.822 Non-pressure chronic ulcer of other part of left lower leg with fat layer exposed9/20/2022 No Yes L97.512 Non-pressure  chronic ulcer of other part of right foot with fat layer exposed 01/26/2021 No Yes I10 Essential (primary) hypertension 01/26/2021 No Yes L97.811 Non-pressure chronic ulcer of other part of right lower leg limited to breakdown 02/15/2021 No Yes of skin Inactive Problems Resolved Problems Electronic Signature(s) Signed: 02/22/2022 11:07:22 AM By: Worthy Keeler PA-C Entered By: Worthy Keeler on 02/22/2022 11:07:22 -------------------------------------------------------------------------------- Progress Note Details Patient Name: Date of Service: [place], [place] 02/22/2022 11:00 A M Medical Record Number: [id number] Patient Account Number: [id number] Date of Birth/Sex: Treating RN: Dec 30, 1950 (71 y.o. [name] Primary Care Provider: Fontaine No Other Clinician: Referring Provider: Treating Provider/Extender: Leanora Ivanoff in Treatment: 90 Subjective Chief Complaint Information obtained from Patient Left LE ulcers History of Present Illness (HPI) 71 year old male who presented to the ER with bilateral lower extremity blisters which had started last week. he has a past medical history of leukemia, diabetes mellitus, hypertension, edema of both lower extremities, his recurrent skin infections, peripheral vascular disease, coronary artery disease, TITO, AUSMUS ([id number]) [id number] Page 9 of 15 congestive heart failure and peripheral neuropathy. in the ER he was given Rocephin and put on Silvadene cream. he was put on oral doxycycline and was asked to follow-up with the [facility]. His last hemoglobin A1c was 6.6 in December and he checks his blood sugar once a week. He does not have any physicians outside the [place] system. He does not recall any vascular duplex studies done either for arterial or venous disease but was told to wear compression stockings which he does not use 05/30/2016 -- we have not yet received any of  his notes from the [facility] hospital system and his arterial and venous duplex studies are scheduled here in [place] around mid February. We are unable to have his insurance accepted by home health agencies and hence he  is getting dressings only once a week. 06/06/16 -- -- I received a call from the patient's PCP at the [facility] at [facility] and spoke to Dr. Garvin Fila, phone number [phone] and fax number [phone]. She confirmed that no vascular testing was done over the last 5 years and she would be happy to do them if the patient did want them to be done at the [place] and we could fax him a request. Readmission: 71 year old male seen by as in February of this year and was referred to vein and vascular for studies and opinion from the vascular surgeons. The patient returns today with a fresh problem having had blisters on his left lower extremity which have been there for about 5 days and he clearly states that he has been wearing his compression stockings as advised though he could not read the moderate compression and has been wearing light compression. Review of his electronic medical records note that he had lower extremity arterial duplex examination done on 06/23/2016 which showed no hemodynamically significant stenosis in the bilateral lower extremity arterial system. He also had a lower extremity venous reflux examination done on 07/07/2016 and it was noted that he had venous incompetence in the right great saphenous vein and bilateral common femoral veins. Patient was seen by Dr. Tamala Julian on the same day and for some reason his notes do not reflect the venous studies or the arterial studies and he recommended patient do a venous duplex ultrasound to look for reflux and return to see him.he would also consider a lymph pump if required. The patient was told that his workup was normal and hence the patient canceled his follow-up appointment. 02/03/17 on evaluation today patient left medial lower  extremity blister appears to be doing about the same. It is still continuing to drain and there's still the blistered skin covering the wound bed which is making it difficult for the alternate to do its job. Fortunately there is no evidence of cellulitis. No fevers chills noted. Patient states in general he is not having any significant discomfort. Patient's lower extremity arterial duplex exam revealed that patient was hemodynamically stable with no evidence of stenosis in regard to the bilateral lower extremities. The lower extremity venous reflux exam revealed the patient had venous incontinence noted in the right greater saphenous and bilateral common femoral vein. There is no evidence of deep or superficial vein thrombosis in the bilateral lower extremities. Readmission: 11/12/18 Patient presents for evaluation our clinic today concerning issues that he is having with his left lower extremity. He tells me that a couple weeks ago he began developing blisters on the left lower extremity along with increased swelling. He typically wears his compression stockings on a regular basis is previously been evaluated both here as well is with vascular surgery they would recommend lymphedema pumps but unfortunately that somehow fell through and he never heard anything back from that. Nonetheless I think lymphedema pumps would be beneficial for this patient. He does have a history of hypertension and diabetes. Obviously the chronic venous stasis and lymphedema as well. At this point the blisters have been given in more trouble he states sometimes when the blisters openings able to clean it down with alcohol and it will dry out and do well. Unfortunately that has not been the case this time. He is having some discomfort although this mean these with cleaning the areas he doesn't have discomfort just on a regular basis. He has not been able to wear his  compression stockings since the blisters arose due to the  fact that of course it will drain into the socks causing additional issues and he didn't have any way to wrap this otherwise. He has increased to taking his Lasix every day instead of every other day. He sees his primary care provider later this month as well. No fevers, chills, nausea, or vomiting noted at this time. 11/19/18-Patient returns at 1 week, per intake RN the amount of seepage into the compression wraps was definitely improved, overall all the wounds are measuring smaller but continuing silver alginate to the wounds as primary dressing 11/26/18 on evaluation today patient appears to be doing quite well in regard to his left lower Trinity ulcers. In fact of the areas that were noted initially he only has two regions still open. There is no evidence of active infection at this time. He still is not heard anything from the company regarding lymphedema pumps as of yet. Again as previously seen vascular they have not recommended any surgical intervention. 12/03/2018 on evaluation today patient actually appears to be doing quite well with regard to his lower extremity ulcers. In fact most of the areas appear to be healed the one spot which does not seem to be completely healed I am unsure of whether or not this is really draining that much but nonetheless there does not appear to be any signs of infection or significant drainage at this point. There is no sign of fever, chills, nausea, vomiting, or diarrhea. Overall I am pleased with how things have progressed I think is very close to being able to transition to his home compression stockings. 12/10/2018 upon evaluation today patient appears to be doing quite well with regard to his left lower extremity. He has been tolerating the dressing changes without complication. Fortunately there is no signs of active infection at this time. He appears after thorough evaluation of his leg to only have 1 small area that remains open at this point everything else  appears to be almost completely closed. He still have significant swelling of the left lower extremity. We had discussed discussing this with his primary care provider he is not able to see her in person they were at the [facility] and right now the [place] is not seeing patients on site. According to the patient anyway. Subsequently he did speak with her apparently and his primary care provider feels that he may likely have a DVT With that being said she has not seen his leg she is just going off of his history. Nonetheless that is a concern that the patient now has as well and while I . do not feel the DVT is likely we can definitely ensure that that is not the case I will go ahead and see about putting that order in today. Nonetheless otherwise I am in a recommend that we continue with the current wound care measures including the compression therapy most likely. We just need to ensure that his leg is indeed free of any DVT s. 12/17/2018 on evaluation today patient actually appears to be completely healed today. He does have 2 very small areas of blistering although this is not anything too significant at this point which is good news. With that being said I am in agreement with the fact that I think he is completely healed at this point. He does want to get back into his compression stocking. The good news is we have gotten approval from insurance for his lymphedema pumps  we received a letter since last saw him last week. The other good news is his study did come back and showed no evidence of a DVT . 12/20/2018 on evaluation today patient presents for follow-up concerning his ongoing issues with his left lower extremity. He was actually discharged last Friday and did fairly well until he states blisters opened this morning. He tells me he has been wearing his compression stocking although he has a hard time getting this on. There does not appear to be any signs of active infection at this time. No  fevers, chills, nausea, vomiting, or diarrhea. 12/27/2018 on evaluation today patient appears to be doing very well with regard to his swelling of the left lower extremity the 4 layer compression wrap seems to have been beneficial for him. Fortunately there is no signs of active infection at this time. Patient has been tolerating the compression wrap without complication and his foot swelling in particular appears to be greatly improved. He does still have a wound on the lateral portion of his left leg I believe this is more of a blister that has now reopened. 01/03/2019 on evaluation today patient actually appears to be doing excellent in regard to his left lower extremity. He did receive his compression pumps and is actually use this 7 times since he was last here in the office. On top of the compression wrap he is now roughly 3 cm better at the calf and 2 cm better at the ankle he also states that his foot seem to go an issue better without even having to use a shoe horn. Obviously I think this is all evidence that he is doing excellent in this regard. The other good news is he does not appear to have anything open today as far as wounds are concerned. 01/15/2019 on evaluation today patient appears to be doing more poorly yet again with regard to his left lower extremity. He has developed new wounds again after being discharged just recently. Unfortunately this continues to be the case that he will heal and then have subsequent new wounds. The last time I was hopeful that he may not end up coming back too quickly especially since he states he has been using his lymphedema pumps along with wearing his compression. Nonetheless he had a blister on the back of his leg that popped up on the left and this has opened up into an ulceration it is quite painful. KURTISS, WENCE ([id number]) [id number] Page 10 of 15 01/22/19 on evaluation today patient actually appears to be doing well  with regard to his wound on the left lower extremity. He's been tolerating the dressing changes without complication including the compression wrap in the wound appears to be significantly smaller today which is great news. Overall very pleased in this regard. 01/29/2019 on evaluation today patient appears to be doing well with regard to his left posterior lower extremity ulcer. He has been tolerating the dressing changes without complication. This is not completely healed but is getting much closer. We did order a Farrow wrap 4000 for him he has received this and has it with him today although I am not sure we are quite ready to start him on that as of yet. We are very close. 02/05/2019 on evaluation today patient actually appears to be doing quite well with regard to his left posterior lower extremity ulcer. He still has a very tiny opening remaining but the fortunate thing is he seems to be healing quite nicely. He  also did get his Farrow wrap which I am hoping will help with his edema control as well at home. Fortunately there is no evidence of active infection. 02/12/2019 patient and fortunately appears to be doing poorly in regard to his wounds of the left lower extremity. He was very close to healing therefore we attempted to use his Velcro compression wraps continuing with lymphedema pumps at home. Unfortunately that does not seem to have done very well for him. He tells me that he wore them all the time but again I am not sure why if that is the case that he is having such significant edema. He is still on his fluid pills as well. With that being said there is no obvious sign of infection although I do wonder about the possibility of infection at this time as well. 02/19/2019 unfortunately upon evaluation today patient appears to be doing more poorly with regard to his left lower extremity. He is not showing signs of significant improvement and I think the biggest issue here is that he does have  an infection that appears to likely be Pseudomonas. That is based on the blue- green drainage that were noted at this time. Unfortunately the antibiotic that has been on is not going to take care of this at all. I think they will get a need to switch him to either Levaquin or Cipro and this was discussed with the patient. 02/26/2019 on evaluation today patient's lower extremity on the left appears to be doing significantly better as compared to last evaluation. Fortunately there is no signs of active infection at this time. He has been tolerating the compression wrap without complication in fact he made it the whole week at this point. He is showing signs of excellent improvement I am very happy in this regard. With that being said he is having some issues with infection we did review the results of his culture which I noted today. He did have a positive finding for Enterobacter as well as Alcaligenes faecalis. Fortunately the Levaquin that I placed him on will work for both which is great news. There is no signs of systemic infection at this point. 10/30; left posterior leg wound in the setting of very significant edema and what looks like chronic venous inflammation. He has compression pumps but does not use them. We have been using 3 layer compression. Silver alginate to the wound as the primary dressing 03/18/2019 on evaluation today patient appears to be doing a little better compared to last time I saw him. He really has not been using his compression pumps he tells me that he is having too much discomfort. He has been keeping his wraps on however. He is only been taking his fluid pills every other day because he states they are not really helping and he has an appointment with his primary care provider at the [facility] tomorrow. Subsequently the wound itself on the left lower extremity does seem to be greatly improved compared to previous. 03/25/2019 on evaluation today patient appears to be doing  better with regard to his wounds on the bilateral lower extremities. The left is doing excellent the right is also doing better although both still do show some signs of open wounds noted at this point unfortunately. Fortunately there is no signs of active infection at this time. The patient also is not really having any significant pain which is good news. Unfortunately there was some confusion with the referral on vascular disease and as far as  getting the patient scheduled there can be contacting him later today to do this fortunately we got this straightened out. 04/01/2019 on evaluation today patient appears to be doing no fevers, chills, nausea, vomiting, or diarrhea. Excellent at this time with regard to his lower extremities. There does not appear to be any open wound at this point which is good news. Fortunately is also no signs of active infection at this time. Overall feel like the patient has done excellent with the compression the problem is every time we got him to this point and then subsequently go to using his own compression things just go right back to where they were. I am not sure how to address this we can try to get an appointment with vascular for 2 weeks now they have yet to call him. Obviously this has become frustrating for the patient as well. I think the issue has just been an honest error as far as scheduling is concerned but nonetheless still worn out the point where I am unsure of which direction we should take. 04/08/2019 on evaluation today patient actually appears to be doing well with regard to his lower extremities. There are no open wounds at this time and things seem to be managing quite nicely as far as the overall edema control is concerned. With that being said he does have his compression socks today for [place] to go ahead and reinitiate therapy in that manner at this point. He is going to be going for shoes to be measured on Wednesday and then coincidentally he  will also be seeing vascular on Thursday. Overall I think this is good news and again I am hopeful that they will be able to do something for him to help prevent ongoing issues with edema control as well. No fevers, chills, nausea, vomiting, or diarrhea. 04/11/2019 on evaluation today patient actually appears to be doing poorly after just being discharged on Monday of this week. He had been experiencing issues with again blisters especially on the left lower extremity. With that being said he was completely healed and appeared to be doing great this past Monday. He then subsequently has new blisters that formed before his appointment with vascular this morning. He was also measured for shoes in the interim. With that being said we may have figured out what exactly is going on and why he continues to have issues like what we are seeing at this point. He takes his compression stockings off at nighttime and then he ends up having to sleep in his chair for 5-6 hours a night. He sleeps with his feet down he cannot really get him up in the recliner and therefore he is sleeping and the worst possible his position with his feet on the floor for that majority of the time. Again as I explained to him that is about one third at minimum at least one fourth of his day that he spending with his feet dangling down on the ground and the worst possible position they could be. I think this may be what is causing the issue. Subsequently I am leaning toward thinking that he may need a hospital bed in order to elevate his legs. We likely can have to coordinate this with his primary care provider at the [facility]. Readmission: 01/26/2021 this is a patient who presents for repeat evaluation here in the clinic although it is actually been couple of years since have seen him in fact it was December 2020 when I last  saw him. Subsequently he never really healed but did end up being lost to follow-up. He tells me has been having  issues ongoing with his lower extremities has bilateral lower extremity lymphedema no real significant or definitive open wounds but in general his lymphedema is way out of control. We were never able to refer him to lymphedema clinic simply due to the fact to be honest we were never able to get him completely healed. I do not see anyone with open wounds. The patient does have evidence of type 2 diabetes mellitus, lymphedema, chronic venous insufficiency, and hypertension. That really has not changed since his last evaluation. 02/09/2021 upon evaluation today patient appears to be doing a little better in regard to his legs although he still having a tremendous amount of drainage especially on the left leg. Fortunately there does not appear to be any evidence of active infection. Of note when we looked into this further it appears that the patient did not have any absorptive dressing on it was just the 4-layer compression wrap. Nonetheless this is probably big part of the issue here. 10/10; he comes in today with 3 large areas on the upper right lower leg likely remanence of denuded blistering under his compression wraps. He has no other wounds on the right. On the left he has the denuded area on the left medial foot and ankle and on the left dorsal foot. Massive lymphedema in both feet dorsally. Using Zetuvit under compression We have increased home health visitation to twice a week to change the dressings and will change it once 02/22/2021 upon evaluation today patient appears to be doing well currently with regard to his wounds. He has been tolerating the dressing changes without complication. Fortunately there does not appear to be any evidence of active infection which is great news. No fevers, chills, nausea, vomiting, or diarrhea. The biggest issue I see currently is that home health is not putting any medicine on the actual wounds before wrapping. 03/01/2021 upon evaluation today the patient's  right leg actually appears to be doing quite well which is great news there does not appear to be any evidence of active infection at this time. No fevers, chills, nausea, vomiting, or diarrhea. With that being said the patient is having issues on the left foot where he is having significant drainage is also an ammonia smell he does not have any animals at home and this makes me concerned about a bacteria producing urea as a byproduct. Again the possible common organisms will be E. coli, Proteus, and Enterococcus. All 3 of which can be successfully treated with Levaquin. For that reason I think that this may be a good option for [place] to consider placing him on and I did obtain a culture as well for confirmation sake. MATAS, BURROWS ([id number]) [id number] Page 11 of 15 03/08/2021 upon evaluation today patient appears to be doing unfortunately still somewhat poorly in regard to his leg ulcerations. He actually has an area on the right leg where he blistered due to the fact that his wrap slid down and caused an area of pinching on his skin and this has led to a significant issue here. 03/15/2021 upon evaluation today patient unfortunately has not been wrapped appropriately with absorptive dressings nor with the appropriate technique for the third layer of the 4-layer compression wrap. These are issues that we continue to try to address with the home health nurse. Also the absorptive dressing that she had was cut in half and  therefore that causes things to leak out it does not actually trap the fluid in regard to the top of the foot overall I think that all these combined are really not seeing things improved significantly here. Fortunately there does not appear to be any signs of significant infection at this time which is good news. He still is having a tremendous amount of drainage. 03/22/2021 upon evaluation today patient appears to be draining tremendously. He still continues  to tell me that he is using his pumps 2 times a day and that coupled with that tells me that he is elevating his legs as well. With that being said all things considered I am really just not seeing the improvement we would expect to see with the 4-layer compression wrap and all the above noted. He in fact had an extremely large Zetuvit dressing on both legs and that they were extremely filled to the max with fluid. This is after just being changed just before the weekend and this is Monday. Nonetheless I am concerned about the fact that there is something going on fairly significant that we cannot get any of this under control and that he is draining this significantly. He supposed be having an echocardiogram it sounds like scheduling has been an issue for him as far as getting in sooner. Its something to do with needing his cousin to drive him because of where it sat and he cannot drive himself to this appointment either way I really think he needs to try to see what he can do about making this happen a little sooner. He tells me he will call today. 03/29/2021 on evaluation today patient appears to be doing about the same in regard to his legs. He did get his cardiology appointment moved up to 6 December which is at least good that is better than what it was before mid December. Overall very pleased in that regard. 04/05/2021 upon evaluation today patient unfortunately is still doing fairly poorly. There does not appear to be any signs of active infection at this time. No fevers, chills, nausea, vomiting, or diarrhea. Unfortunately I think until his edema is under control and overall fluid overload there is really not to be much chance that I can do much to get him better. This is quite unfortunate and frustrating both for myself and the patient to be perfectly honest. Nonetheless I think that he really needs to have a conversation both with his primary care provider as well as cardiologist he sees the  PCP on Monday and cardiology on Wednesday of next week. 04/13/2021 upon evaluation today patient appears to be doing poorly in regard to his bilateral lower extremities his left is still worse than the right. With that being said he has a tremendous amount of drainage he did see his primary care provider yesterday there really was not much there to be done from their perspective. He sees cardiology tomorrow. Nonetheless my biggest concern here is simply that if we do not get the edema under control he is going to continue to have drainage and honestly I think at some point he is going to become infected severely that is my main concern. 04/19/2021 upon evaluation today patient appears to be doing poorly still in regard to his legs. Unfortunately there does not appear to be any signs of infection at this point. He does have a tremendous amount of drainage however. We have not seen the results back from the cardiologist and the echocardiogram that was  done. It appears that the patient checked out okay as far as that is concerned with regard to ejection fraction though we still have some issues here to be honest with his diastolic function. I am unsure if this is accounting for everything that we are seeing or not. Either way he has a tremendous amount of drainage from his legs that we are just not able to control in the outpatient setting at this point. I have reached out to Dr. Rockey Situ his cardiologist to see once he reviews the sheet if there is anything that he feels like can be done from an outpatient perspective if not then I think the way to go is probably can to be through inpatient admission and diuresis. Otherwise I am not sure how working to get this under control we tried antibiotics, compression wrapping, and I have told the patient to be elevating his legs I am not sure how much he does of this but either way I think that this is still an ongoing issue nonetheless. 04/26/2021 upon evaluation  today patient appears to be doing poorly in regard to his legs. He is having a tremendous amount of fluid at this point which is quite unfortunate. Its to the point that he may have had at least 5 to 10 pounds of fluid in his dressings this morning when they were removed these were changed this Friday. Subsequently I think he needs to go to the ER for further evaluation and treatment I think is probably can need diuresis possibly even IV antibiotics been on what the blood work looks like but in general I feel like he needs something to get this under control from an outpatient perspective absent of everywhere I can think of and I cannot get this under control with our traditional measures. I think this is going require more so that we can get him better 12/30; this is a patient with severe bilateral lymphedema. He was hospitalized from 04/26/2021 through 04/29/2021 treated for cellulitis in the setting of lower extremity ulcers and lymphedema. After he left the hospital he is apparently seen for nurse visit our staff contacted cardiology and he has been started on Lasix 40 mg. Apparently his legs have less edema. Lab work from 05/04/2021 showed a BUN of 38 and creatinine of 1.59 these are elevated versus previous where his creatinine seems to have been 1.30 on 12/19 his potassium is 4.3. I believe the lab work is being followed by cardiology We have him in a 4-layer wrap. Xeroform on the leg wounds and sit to fit on the Berry damage skin on the left dorsal foot versus right dorsal foot. He has compression pumps but does not use them. We have apparently not yet ordered him compression stockings 05/17/2021 upon evaluation today patient's legs though better than last time I personally saw him appear to be getting worse compared to where they were previous. Dr. Quentin Cornwall was actually last 1 to see you I have not seen him since 19 December. That was before he went into the ER. Coming out apparently his legs  looked also and they still look better but not as good as they were in the past. 1/16; patient with severe bilateral lymphedema. Severe scaled hyperkeratotic skin on the dorsal aspect of his distal left foot and left medial ankle.. On the right side changes are not as bad. He did not have any weeping edema. Our intake nurse was convinced that he is being compliant with compression pumps 1 hour  twice a day 05/31/2021 upon evaluation today patient actually appears to be doing a little bit better in my opinion in regard to his feet. I do not see as much drainage and it being just completely wet as it was previous. Fortunately I do not also see any signs of active infection which is great news as well. 06/07/21 Upon inspection patient's wound bed actually showed signs of doing well he is not nearly as weepy and wet as he has been in the past and overall very pleased in that regard. Fortunately I do not see any signs of active infection locally or nor systemically at this time. Which is great news. No fevers, chills, nausea, vomiting, or diarrhea. 06/14/2021 upon evaluation today patient appears to be doing well with regard to his right foot I am pleased in that regard. His left foot is still draining quite a bit despite using lymphedema pumps, 4-layer compression wraps, and he tells me elevating his legs as well. He also has Lasix that he takes twice a day. Nonetheless I believe that this is still good to be an ongoing issue. We have a hard time getting this under control as far as the swelling is concerned. 06/21/2021 upon evaluation today patient appears to be doing decently well in regard to his wounds all things considered. He still has a tremendous amount of drainage and fluid noted at this point. Fortunately I do not see any signs of active infection locally or systemically at this point which is great news. Nonetheless I am unsure where to go and how to do this as far as trying to limit his swelling and  weeping from his toes in particular. 06/28/2021 upon evaluation patient unfortunately continues to have significant drainage from his feet. We have been keeping him in a compression wrap and despite this he still continues to have extreme fluid issues he seen his cardiologist he is seeing the nephrologist. We really cannot find any way to get this under control when he did well was when he was in the hospital and they got some of the fluid away. But outside of that we are just struggling to achieve the long-term goal of getting this under control and keeping it under control unfortunately. 07/05/2021 upon evaluation today patient appears to be doing poorly in regard to his feet. Unfortunately this continues to be a significant issue and to be honest I am really not certain what to do about it. I referred him to Dr. Randol Kern at [facility] and he does have an appointment although it is 12 April. He also sees his primary care provider on 6 April. He did not want to see Dr. Haynes Kerns until after he saw his PCP that is the reason the appointment so far out. There is really not much I can do in that regard. Nonetheless I do think that we are still continue to have significant lymphedema issues with significant mount of weeping in regard to the feet and again this has just become extremely difficult to manage to be honest I am not sure if there is something else that Dr. Haynes Kerns or someone else could recommend he also will be seeing Dr. Dellia Nims in 2 weeks when I am on vacation and at that time I will see if Dr. Dellia Nims has any ideas about where to go from here in the meantime. MARKS, SCALERA ([id number]) [id number] Page 12 of 15 07/12/2021 upon evaluation today patient appears to be doing about as well as can be expected with  regard to his feet. He does actually see his kidney doctor this Friday. He also will be seeing his primary care provider on April 4 and then following that around mid April  he will be seeing Dr. Haynes Kerns at [facility] which was a referral made for him. Again my goal is to try to find out some way to fix this and to be perfectly honest we have had some issues with making any good adjustments. When he was in the hospital and greater amounts of Lasix he was able to get this down and it looked much better upon discharge. With that being said right now things just are not doing nearly as good as what they used to be. 3/13; patient presents for follow-up. He has been using his lymphedema pumps over the past week. He reports an increase in his Lasix dose. He has no issues or complaints today. He denies signs of infection. 07/26/2021 upon evaluation today patient appears to be doing better in regard to his feet bilaterally. Both are showing signs of much less drainage which is great news and overall very pleased in that regard. Fortunately there does not appear to be any evidence of active infection locally or systemically at this time. No fevers, chills, nausea, vomiting, or diarrhea. 08/02/2021 upon evaluation today patient appears to be doing well with regard to his feet. Both are showing signs of being drier the right pretty much has not really draining much at all which is great news. The left is not draining anywhere close to his much as it was during the last evaluation. This is excellent news and overall very pleased. 08/09/2021 upon evaluation today patient appears to be doing well with regard to his legs the right leg especially showing signs of excellent improvement which is great news I do not see any evidence of active infection locally or systemically which is great. In regard to the left leg he still has some weeping and drainage but nothing as significant as what it was in the past this is great news. 08-16-2021 upon evaluation today patient appears to actually be doing quite well in my opinion in regard to his feet. This is significantly improved compared to what we  previously seen and overall I am extremely pleased in that regard. I do believe that He is actually improving although this is obviously very slow going. 08-23-2021 upon evaluation today patient appears to be doing well currently in regard to his right leg which actually is pretty dry at this point today. Fortunately I do not see any evidence of active infection at this time which is great news. No fevers, chills, nausea, vomiting, or diarrhea. 08-30-2021 upon evaluation today patient appears to be doing well with regard to his lower extremities. The right foot is pretty much completely dry which is great news the left foot though not completely dry seems to be doing decently well. I do not see any signs of active infection locally or systemically which is great news. No fevers, chills, nausea, vomiting, or diarrhea. 09-06-2021 upon evaluation today patient appears to be doing well with regard to his feet in fact now the left foot is almost completely dry as well and I am definitely seeing a lot of significant improvement. With that being said unfortunately he actually appears to have some cellulitis of his right thigh. His toes are also little bit red but this may just be due to the increased swelling. He really is not warm to touch in regard to  the toes. 5/8; excellent edema control on the right foot and lower leg there is no open wounds but we continue to put compression on this otherwise this will breakdown. He is using his compression pumps twice a day at home The area that is problematic is on the left dorsal foot some areas that are not fully epithelialized with very dry fissured skin over this area. 09-20-2021 upon evaluation today patient appears to be doing a little bit worse in regard to swelling at this time. Fortunately I do not see any signs of infection with that being said the wrap was not on quite as well as what I would like to have seen. I do believe that this has caused a little bit of  excess swelling and again we need to try to get this under good control. 09-27-2021 upon evaluation today patient appears to be doing awesome in regard to his feet and legs. Everything is measuring smaller the swelling is down and to be perfectly honest I am extremely pleased with the end of his feet especially on the left side and how dry this is today. I do think we are on the right track here. 5/31; patient presents for follow-up. He is using nystatin powder to the feet bilaterally under 4-layer compression. He has no issues or complaints today. He states he is going to the lymphedema clinic tomorrow. Since he has no open wounds on the right lower extremity they will be focused on the side. 10-11-2021 upon evaluation today patient appears to be doing excellent in regard to his legs. He has been tolerating the dressing changes without complication. Fortunately there does not appear to be any evidence of active infection locally or systemically at this time which is great news. No fevers, chills, nausea, vomiting, or diarrhea. 10-18-2021 upon evaluation today patient appears to be doing well with regard to his legs. He has been tolerating the dressing changes without complication and actually seen in lymphedema clinic now in regard to his right leg were taken care of the left leg. Fortunately I do not see any evidence of active infection locally or systemically at this time. 10-26-2021 upon evaluation today patient's wounds actually are showing signs of doing well in fact he really does not have wounds as much is weeping of the lower extremities. Fortunately I do not see any evidence of active infection locally or systemically which is great news. No fever or chills noted 11-02-2021 upon evaluation today patient appears to be doing decently well in regard to his legs. Of actually been on the phone quite a bit discussing with [facility] orthotics custom compression for him. I also have been discussing  everything with his lymphedema clinic provider as well. T eresa notes that there is really not much more that she can do for him which was noted last week as well. Subsequently I do believe that the patient would benefit from getting custom compression in fact I think that is what he is going require to keep anything under control here. He voiced understanding. 11-08-2021 upon evaluation today patient appears to be doing excellent in regard to his legs and feet. Fortunately I do not see any signs of infection at this time which is great news. No fever or chills noted again I am still been working on trying to figure out what exactly we need to do as far as getting the [place] involved in coverage for custom compression for this patient. I also discussed with him that he does  need to have custom shoes which I completely understand. With that being said it does make it difficult thinking about what kind of combination of shoes and compression is going to be best for him for the long-term. Fortunately I do not see any evidence of active infection locally or systemically which is great news. Overall I think you are doing quite well. 11-16-2021 upon evaluation today patient appears to be doing well currently in regard to his legs and feet. Everything is significantly smaller compared to what has been. Fortunately I do not see any evidence of active infection locally or systemically at this time which is great news. 11-23-2021 upon evaluation today patient appears to be doing well with regard to his legs bilaterally. We can actually initiate treatment with a recommendation today to utilize Tubigrip and try to see if we can get this under better control. The patient is in agreement with the plan. Nonetheless the big question is whether this will be something that he can utilize at home in order to keep his swelling down and allow him to be able to function without having to come into the clinic as frequently. He notes  that he would love to get to this point. He does have a cousin who is willing to help him with change out the Tubigrip when necessary. 7/25; the patient does not have any open wounds however his left leg is a lot more swollen than it was last week. His cousin came in who presumably would be the one to change his juxta lites to practice on doing this for the patient. She apparently lives in the [place] block. In the meantime he has been using Tubigrip 12-07-2021 upon evaluation today patient appears to be doing well with regard to his legs although we are transitioning to try to get him to use the Tubigrip which again has been a little bit of a setback but not too much overall I think he is still doing well he still able to wear his current shoes which is good news. He does have a cousin who is willing to help him with the Tubigrip and we are training her how to do this as well. She is here today. 12-14-2021 upon evaluation today patient appears to be doing well currently in regard to his right leg which I think is doing excellent with the Tubigrip. With regard to the left leg unfortunately this is not doing nearly as well and will get me to do something to intervene here. I think getting back into a 4-layer wraps can be the way to go short-term T get this back under control and the patient voiced understandingNike Southwell, [name] ([id number]) [id number] Page 13 of 15 12-21-2021 upon evaluation today patient appears to be tolerating the dressing changes currently without complication the wrap of the left leg is doing well the right leg reason Tubigrip. The wound is looking significantly improved. 12-28-2021 upon evaluation today patient appears to be doing well currently in regard to his wound. He has been tolerating the dressing changes without complication. Fortunately there is no evidence of active infection locally or systemically at this time. I think he is very close to complete  resolution on the left leg and I think we are ready to go back to the Tubigrip. 01-04-2022 upon evaluation today patient appears to be doing okay in regard to his legs of the more swollen because he was rolling and pushing the Tubigrip down because it felt "too tight". With that  being said I discussed with the patient today that we really do need to keep this pulled up and if he keeps it pulled up and when it starts to feel tight gets to where he can elevate his legs I think that he can keep this under control. I would suggest if it starts feeling tight that he lay down where he can elevate his legs above heart level to basically help the swelling to go down is much as possible. He voiced understanding. I also told him that is can to be his job to keep these pulled up and that if he is not able to do this I Georgina Peer probably have to refer him elsewhere to get a second opinion as I have pretty much done everything I can try to get his legs under control and keep them under control he is much better than where he used to be and his feet can actually fit in his shoes which is great but in general I just do not know what else to do he has his cousin helping with putting the Tubigrip's on which is awesome and a great resource for him but again she cannot be there to keep them pulled up and nor is that even her job. 01-11-2022 upon evaluation today patient's legs are looking much better. He actually kept the Tubigrip up this week which has made a tremendous difference and improvement in his overall appearance today. Fortunately I do not see any signs of active infection locally or systemically at this time. 01-18-2022 upon evaluation today patient appears to be doing well currently in regard to his legs. If active does not appear to be anything open at this point. This is great news. 01-25-2022 upon evaluation today patient appears to be doing well currently in regard to his legs. Fortunately I do not see any signs  of significant open wounds which is great news and overall I do believe that he is doing quite well. I do not see any evidence of active infection locally or systemically at this time which is great news. No fevers, chills, nausea, vomiting, or diarrhea. 02-01-2022 upon evaluation patient actually appears to be doing quite well with just 2 slightly draining areas noted at this point. Fortunately I do not see any evidence of active infection locally or systemically which is great news and overall I am extremely pleased with where we stand today. 02-08-2022 upon evaluation today patient appears to be doing well currently in regard to his legs other than the fact that he has not been pulling the Tubigrip off to keep it in place. We discussed this in great detail today I explained why he is getting need to be diligent in this regard in order to make sure that he continues to see signs of improvement in overall does not backtrack as far as his swelling is concerned. 02-22-2022 upon evaluation today patient actually appears to be doing excellent in fact he is completely healed based on what we see. I do not see any evidence of infection locally or systemically which is great news. No fevers, chills, nausea, vomiting, or diarrhea. Objective Constitutional Obese and well-hydrated in no acute distress. Vitals Time Taken: 11:05 AM, Weight: 312 lbs, Temperature: 98 F, Pulse: 68 bpm, Respiratory Rate: 18 breaths/min, Blood Pressure: 165/91 mmHg. Respiratory normal breathing without difficulty. Psychiatric this patient is able to make decisions and demonstrates good insight into disease process. Alert and Oriented x 3. pleasant and cooperative. General Notes: Patient's wounds again  are all completely closed his legs are doing great and overall I am extremely pleased he has a plan for going forward to continue to keep these under control. His cousin is helping him out. 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 Compression Pump: Use compression pump on right lower extremity for 60 minutes, twice daily. Moisturize legs daily after removing compression garments. Elevate, Exercise Daily and Avoid Standing for Long Periods of Time. DO YOUR BEST to sleep in the bed at night. DO NOT sleep in your recliner. Long hours of sitting in a recliner leads to swelling of the legs and/or potential wounds on your backside. 1. I am going to recommend that we have the patient continue to monitor for any signs of worsening or infection. Obviously if anything opens he should contact the office and let me know otherwise we will get a continue to see where things stand going forward. We will see him back for follow-up visit as needed. Electronic Signature(s) Signed: 02/22/2022 2:43:31 PM By: Worthy Keeler PA-C Entered By: Worthy Keeler on 02/22/2022 14:43:31 -------------------------------------------------------------------------------- SuperBill Details Patient Name: Date of Service: [name], [place] 02/22/2022 Medical Record Number: [id number] Patient Account Number: [id number] Date  of Birth/Sex: Treating RN: 08-03-1950 (71 y.o. [name]) [name] Primary Care Provider: Font[place] No Other Clinician: Referring Provider: Treating Provider/Extender: Leanora Ivanoff in Treatment: 56 Diagnosis Coding ICD-10 Codes Code Description E11.622 Type 2 diabetes mellitus with other skin ulcer I89.0 Lymphedema, not elsewhere classified I87.2 Venous insufficiency (chronic) (peripheral) L97.822 Non-pressure chronic ulcer of other part of left lower leg with fat layer exposed L97.512 Non-pressure chronic ulcer of other part of right foot with fat layer exposed I10 Essential (primary) hypertension L97.811 Non-pressure chronic ulcer of other part of right lower leg limited to breakdown of skin Facility Procedures : CPT4 Code: 95284132 Description: 44010 - WOUND CARE VISIT-LEV 2 EST PT Modifier: Quantity: 1 Physician Procedures : CPT4 Code Description Modifier [id number] - WC PHYS LEVEL 3 - EST PT ICD-10 Diagnosis Description E11.622 Type 2 diabetes mellitus with other skin ulcer I89.0 Lymphedema, not elsewhere classified I87.2 Venous insufficiency (chronic) (peripheral)  L97.822 Non-pressure chronic ulcer of other part of left lower leg with fat layer exposed Quantity: 1 Electronic Signature(s) Signed: 02/22/2022 2:43:45 PM By: Worthy Keeler PA-C Previous Signature: 02/22/2022 11:53:04 AM Version By: [name] RN Marcello Moores, [name] ([id number]) [id number] Page 15 of 15 Entered By: Worthy Keeler on 02/22/2022 14:43:45

## 2022-02-23 NOTE — Progress Notes (Signed)
Brady, [name] ([id number]) [id number] Page 1 of 7 Visit Report for 02/22/2022 Arrival Information Details Patient Name: Date of Service: [name] 02/22/2022 11:00 [place] Record Number: [id number] Patient Account Number: [id number] Date of Birth/Sex: Treating RN: May 20, 1950 (71 y.o. [name]) [name] Primary Care Granville Whitefield: Fontaine No Other Clinician: Referring Karisma Meiser: Treating Sam Wunschel/Extender: Leanora Ivanoff in Treatment: 34 Visit Information History Since Last Visit All ordered tests and consults were completed: No Patient Arrived: Ambulatory Added or deleted any medications: No Arrival Time: 11:01 Any new allergies or adverse reactions: No Accompanied By: cousin Had a fall or experienced change in No Transfer Assistance: None activities of daily living that may affect Patient Identification Verified: Yes risk of falls: Secondary Verification Process Completed: Yes Signs or symptoms of abuse/neglect since last visito No Patient Requires Transmission-Based Precautions: No Hospitalized since last visit: No Patient Has Alerts: Yes Implantable device outside of the clinic excluding No Patient Alerts: AVVS consult on file cellular tissue based products placed in the center Last ABI-R 1.09; L 1.04 since last visit: Has Dressing in Place as Prescribed: Yes Has Compression in Place as Prescribed: Yes Pain Present Now: No Electronic Signature(s) Signed: 02/23/2022 8:53:50 AM By: [name] RN Entered By: [name] on 02/22/2022 11:05:22 -------------------------------------------------------------------------------- Clinic Level of Care Assessment Details Patient Name: Date of Service: [name] 02/22/2022 11:00 [place] Record Number: [id number] Patient Account Number: [id number] Date of Birth/Sex: Treating RN: [birth year] (71 y.o. Oval Linsey Primary Care Primus Gritton: Fontaine No  Other Clinician: Referring Statia Burdick: Treating Kieanna Rollo/Extender: Leanora Ivanoff in Treatment: 56 Clinic Level of Care Assessment Items TOOL 4 Quantity Score X- 1 0 Use when only an EandM is performed on FOLLOW-UP visit ASSESSMENTS - Nursing Assessment / Reassessment X- 1 10 Reassessment of Co-morbidities (includes updates in patient status) IYAD, DEROO ([id number]) [id number] Page 2 of 7 X- 1 5 Reassessment of Adherence to Treatment Plan ASSESSMENTS - Wound and Skin A ssessment / Reassessment '[]'$  - 0 Simple Wound Assessment / Reassessment - one wound '[]'$  - 0 Complex Wound Assessment / Reassessment - multiple wounds '[]'$  - 0 Dermatologic / Skin Assessment (not related to wound area) ASSESSMENTS - Focused Assessment '[]'$  - 0 Circumferential Edema Measurements - multi extremities '[]'$  - 0 Nutritional Assessment / Counseling / Intervention '[]'$  - 0 Lower Extremity Assessment (monofilament, tuning fork, pulses) '[]'$  - 0 Peripheral Arterial Disease Assessment (using hand held doppler) ASSESSMENTS - Ostomy and/or Continence Assessment and Care '[]'$  - 0 Incontinence Assessment and Management '[]'$  - 0 Ostomy Care Assessment and Management (repouching, etc.) PROCESS - Coordination of Care X - Simple Patient / Family Education for ongoing care 1 15 '[]'$  - 0 Complex (extensive) Patient / Family Education for ongoing care '[]'$  - 0 Staff obtains Programmer, systems, Records, T Results / Process Orders est '[]'$  - 0 Staff telephones HHA, Nursing Homes / Clarify orders / etc '[]'$  - 0 Routine Transfer to another Facility (non-emergent condition) '[]'$  - 0 Routine [facility] Admission (non-emergent condition) '[]'$  - 0 New Admissions / Biomedical engineer / Ordering NPWT Apligraf, etc. , '[]'$  - 0 Emergency [facility] Admission (emergent condition) X- 1 10 Simple Discharge Coordination '[]'$  - 0 Complex (extensive) Discharge Coordination PROCESS - Special Needs '[]'$  -  0 Pediatric / Minor Patient Management '[]'$  - 0 Isolation Patient Management '[]'$  - 0 Hearing / Language / Visual special needs '[]'$  - 0 Assessment of Community assistance (transportation, D/C planning, etc.) '[]'$  - 0  Additional assistance / Altered mentation '[]'$  - 0 Support Surface(s) Assessment (bed, cushion, seat, etc.) INTERVENTIONS - Wound Cleansing / Measurement '[]'$  - 0 Simple Wound Cleansing - one wound '[]'$  - 0 Complex Wound Cleansing - multiple wounds '[]'$  - 0 Wound Imaging (photographs - any number of wounds) '[]'$  - 0 Wound Tracing (instead of photographs) '[]'$  - 0 Simple Wound Measurement - one wound '[]'$  - 0 Complex Wound Measurement - multiple wounds INTERVENTIONS - Wound Dressings '[]'$  - 0 Small Wound Dressing one or multiple wounds '[]'$  - 0 Medium Wound Dressing one or multiple wounds '[]'$  - 0 Large Wound Dressing one or multiple wounds '[]'$  - 0 Application of Medications - topical '[]'$  - 0 Application of Medications - injection INTERVENTIONS - Miscellaneous DEONTAE, ROBSON ([id number]) [id number] Page 3 of 7 '[]'$  - 0 External ear exam '[]'$  - 0 Specimen Collection (cultures, biopsies, blood, body fluids, etc.) '[]'$  - 0 Specimen(s) / Culture(s) sent or taken to Lab for analysis '[]'$  - 0 Patient Transfer (multiple staff / Harrel Lemon Lift / Similar devices) '[]'$  - 0 Simple Staple / Suture removal (25 or less) '[]'$  - 0 Complex Staple / Suture removal (26 or more) '[]'$  - 0 Hypo / Hyperglycemic Management (close monitor of Blood Glucose) '[]'$  - 0 Ankle / Brachial Index (ABI) - do not check if billed separately X- 1 5 Vital Signs Has the patient been seen at the [facility] within the last three years: Yes Total Score: 45 Level Of Care: New/Established - Level 2 Electronic Signature(s) Signed: 02/23/2022 8:53:50 AM By: [name] RN Entered By: [name] on 02/22/2022 11:52:55 -------------------------------------------------------------------------------- Encounter  Discharge Information Details Patient Name: Date of Service: [name], [place] 02/22/2022 11:00 [place] Record Number: [id number] Patient Account Number: [id number] Date of Birth/Sex: Treating RN: Jul 09, 1950 (71 y.o. [name]) [name] Primary Care Johnston Maddocks: Fontaine No Other Clinician: Referring Keylon Labelle: Treating Teyana Pierron/Extender: Leanora Ivanoff in Treatment: 56 Encounter Discharge Information Items Discharge Condition: Stable Ambulatory Status: [place] Discharge Destination: Home Transportation: Private Auto Accompanied By: cousin Schedule Follow-up Appointment: Yes Clinical Summary of Care: Electronic Signature(s) Signed: 02/22/2022 11:55:23 AM By: [name] RN Entered By: [name] on 02/22/2022 11:55:23 [name] ([id number]) [id number] Page 4 of 7 -------------------------------------------------------------------------------- Lower Extremity Assessment Details Patient Name: Date of Service: [name] 02/22/2022 11:00 [place] Medical Record Number: [id number] Patient Account Number: [id number] Date of Birth/Sex: Treating RN: May 23, 1950 (71 y.o. [name]) [name] Primary Care Maaz Spiering: Fontaine No Other Clinician: Referring Kaiden Dardis: Treating Mirel Hundal/Extender: Leanora Ivanoff in Treatment: 56 Edema Assessment Assessed: Shirlyn Goltz: No] Patrice Paradise: No] Edema: [Left: No] [Right: No] Calf Left: Right: Point of Measurement: 38 cm From Medial Instep 38 cm 38 cm Ankle Left: Right: Point of Measurement: 14 cm From Medial Instep 26 cm 27 cm Vascular Assessment Pulses: Dorsalis Pedis Palpable: [Left:Yes] [Right:Yes] Electronic Signature(s) Signed: 02/23/2022 8:53:50 AM By: [name] RN Previous Signature: 02/22/2022 11:50:26 AM Version By: [name] RN Entered By: [name] on 02/22/2022  13:11:49 -------------------------------------------------------------------------------- Multi Wound Chart Details Patient Name: Date of Service: [name], [place] 02/22/2022 11:00 A M Medical Record Number: [id number] Patient Account Number: [id number] Date of Birth/Sex: Treating RN: Sep 09, 1950 (71 y.o. Oval Linsey Primary Care Winnie Umali: Fontaine No Other Clinician: Referring Loralee Weitzman: Treating Shruti Arrey/Extender: Leanora Ivanoff in Treatment: 81 Vital Signs Height(in): Pulse(bpm): 68 Weight(lbs): 312 Blood Pressure(mmHg): 165/91 Body Mass Index(BMI): Temperature(F): 98 Respiratory Rate(breaths/min): 18 [Treatment Notes:Wound Assessments Treatment Notes]  Electronic Signature(s) Signed: 02/22/2022 11:50:51 AM By: [name] RN Entered By: [name] on 02/22/2022 11:50:51 [name] ([id number]) [id number] Page 5 of 7 -------------------------------------------------------------------------------- Multi-Disciplinary Care Plan Details Patient Name: Date of Service: [name] [facility] 02/22/2022 11:00 A M Medical Record Number: [id number] Patient Account Number: [id number] Date of Birth/Sex: Treating RN: 01/10/1951 (71 y.o. [name]) [name] Primary Care Dyamon Sosinski: Fontaine No Other Clinician: Referring Yennifer Segovia: Treating Jaymes Hang/Extender: Leanora Ivanoff in Treatment: 78 Active Inactive Electronic Signature(s) Signed: 02/22/2022 11:53:31 AM By: [name] RN Previous Signature: 02/22/2022 11:50:40 AM Version By: [name] RN Entered By: [name] on 02/22/2022 11:53:31 -------------------------------------------------------------------------------- Pain Assessment Details Patient Name: Date of ServiceHenrine Screws [place] 02/22/2022 11:00 [place] Record Number: [id number] Patient Account Number: [id number] Date of Birth/Sex: Treating RN: 26-Mar-1951 (71 y.o. Oval Linsey Primary Care Austynn Pridmore: Fontaine No Other Clinician: Referring Keoshia Steinmetz: Treating Shante Archambeault/Extender: Leanora Ivanoff in Treatment: 14 Active Problems Location of Pain Severity and Description of Pain Patient Has Paino No Site Locations [place], [place] ([id number]) [id number] Page 6 of 7 Pain Management and Medication Current Pain Management: Electronic Signature(s) Signed: 02/23/2022 8:53:50 AM By: [name] RN Entered By: [name] on 02/22/2022 11:06:08 -------------------------------------------------------------------------------- Patient/Caregiver Education Details Patient Name: Date of Service: [name], [place] 10/17/2023andnbsp11:00 [place] Number: [id number] Patient Account Number: [id number] Date of Birth/Gender: Treating RN: Oct 11, 1950 (71 y.o. Oval Linsey Primary Care Physician: Fontaine No Other Clinician: Referring Physician: Treating Physician/Extender: Leanora Ivanoff in Treatment: 7 Education Assessment Education Provided To: Patient Education Topics Provided Wound/Skin Impairment: Methods: Explain/Verbal Responses: State content correctly Electronic Signature(s) Signed: 02/23/2022 8:53:50 AM By: [name] RN Entered By: [name] on 02/22/2022 11:53:15 -------------------------------------------------------------------------------- Vitals Details Patient Name: Date of Service: [name], [place] 02/22/2022 11:00 [place] Number: [id number] Patient Account Number: [id number] Date of Birth/Sex: Treating RN: 1951/03/03 (71 y.o. Oval Linsey Primary Care Silva Aamodt: Fontaine No Other Clinician: Referring Torrance Frech: Treating Tationna Fullard/Extender: Leanora Ivanoff in Treatment: 56 Vital Signs Time Taken: 11:05 Temperature (F): [place], [place] ([id number]) [id number] Page 7  of 7 Weight (lbs): 312 Pulse (bpm): 68 Respiratory Rate (breaths/min): 18 Blood Pressure (mmHg): 165/91 Reference Range: 80 - 120 mg / dl Electronic Signature(s) Signed: 02/23/2022 8:53:50 AM By: [name] RN Entered By: [name] on 02/22/2022 11:05:43

## 2022-03-01 ENCOUNTER — Encounter: Payer: No Typology Code available for payment source | Admitting: Physician Assistant

## 2022-03-07 ENCOUNTER — Encounter (INDEPENDENT_AMBULATORY_CARE_PROVIDER_SITE_OTHER): Payer: Self-pay

## 2022-03-08 ENCOUNTER — Encounter: Payer: No Typology Code available for payment source | Admitting: Physician Assistant

## 2022-03-28 ENCOUNTER — Ambulatory Visit: Payer: PPO | Admitting: Podiatry

## 2022-08-01 ENCOUNTER — Ambulatory Visit: Payer: No Typology Code available for payment source | Admitting: Physician Assistant

## 2023-01-30 ENCOUNTER — Ambulatory Visit
Admission: RE | Admit: 2023-01-30 | Discharge: 2023-01-30 | Disposition: A | Discharge status: Home / Self Care | Payer: No Typology Code available for payment source | Source: Ambulatory Visit | Attending: Emergency Medicine | Admitting: Emergency Medicine

## 2023-01-30 VITALS — BP 166/90 | HR 63 | Temp 98.6°F

## 2023-01-30 DIAGNOSIS — E876 Hypokalemia: Secondary | ICD-10-CM

## 2023-01-30 DIAGNOSIS — I891 Lymphangitis: Secondary | ICD-10-CM | POA: Diagnosis not present

## 2023-01-30 DIAGNOSIS — L03116 Cellulitis of left lower limb: Secondary | ICD-10-CM | POA: Diagnosis not present

## 2023-01-30 LAB — CBC WITH DIFFERENTIAL/PLATELET
Abs Immature Granulocytes: 0.03 10*3/uL (ref 0.00–0.07)
Basophils Absolute: 0 10*3/uL (ref 0.0–0.1)
Basophils Relative: 1 %
Eosinophils Absolute: 0.3 10*3/uL (ref 0.0–0.5)
Eosinophils Relative: 6 %
HCT: 35.2 % — ABNORMAL LOW (ref 39.0–52.0)
Hemoglobin: 11.2 g/dL — ABNORMAL LOW (ref 13.0–17.0)
Immature Granulocytes: 1 %
Lymphocytes Relative: 20 %
Lymphs Abs: 1.1 10*3/uL (ref 0.7–4.0)
MCH: 28.4 pg (ref 26.0–34.0)
MCHC: 31.8 g/dL (ref 30.0–36.0)
MCV: 89.1 fL (ref 80.0–100.0)
Monocytes Absolute: 0.4 10*3/uL (ref 0.1–1.0)
Monocytes Relative: 7 %
Neutro Abs: 3.5 10*3/uL (ref 1.7–7.7)
Neutrophils Relative %: 65 %
Platelets: 180 10*3/uL (ref 150–400)
RBC: 3.95 MIL/uL — ABNORMAL LOW (ref 4.22–5.81)
RDW: 16 % — ABNORMAL HIGH (ref 11.5–15.5)
WBC: 5.3 10*3/uL (ref 4.0–10.5)
nRBC: 0 % (ref 0.0–0.2)

## 2023-01-30 LAB — BASIC METABOLIC PANEL
Anion gap: 12 (ref 5–15)
BUN: 23 mg/dL (ref 8–23)
CO2: 29 mmol/L (ref 22–32)
Calcium: 9.3 mg/dL (ref 8.9–10.3)
Chloride: 99 mmol/L (ref 98–111)
Creatinine, Ser: 1.3 mg/dL — ABNORMAL HIGH (ref 0.61–1.24)
GFR, Estimated: 58 mL/min — ABNORMAL LOW (ref >60)
Glucose, Bld: 193 mg/dL — ABNORMAL HIGH (ref 70–99)
Potassium: 3.4 mmol/L — ABNORMAL LOW (ref 3.5–5.1)
Sodium: 140 mmol/L (ref 135–145)

## 2023-01-30 MED ORDER — DOXYCYCLINE HYCLATE 100 MG PO CAPS: 100.0000 mg | ORAL_CAPSULE | Freq: Two times a day (BID) | ORAL | 0 refills | Status: AC

## 2023-01-30 MED ORDER — CEFTRIAXONE SODIUM 1 G IJ SOLR
1.0000 g | Freq: Once | INTRAMUSCULAR | Status: AC
  Administered 2023-01-30: 1 g via INTRAMUSCULAR

## 2023-01-30 MED ORDER — CEPHALEXIN 500 MG PO CAPS: 500.0000 mg | ORAL_CAPSULE | Freq: Two times a day (BID) | ORAL | 0 refills | Status: AC

## 2023-01-30 MED ORDER — MUPIROCIN 2 % EX OINT: 1.0000 | TOPICAL_OINTMENT | Freq: Two times a day (BID) | CUTANEOUS | 0 refills | Status: DC

## 2023-01-30 NOTE — ED Triage Notes (Signed)
Pt presents to UC c/o  rash on LT upper leg, pt noticed this onset Thursday last week. Area not itchy, but is "bumpy" and warm to the touch

## 2023-01-30 NOTE — ED Provider Notes (Signed)
HPI  SUBJECTIVE:  [name] is a 72 y.o. male who presents with 5 days of painful erythema, increased temperature over his left medial thigh.  He states it has not spread since it started.  He does not recall an insect bite or trauma.  No fevers, body aches, chills, itching.  He reports bilateral lower extremity swelling, worse on the left, but states that this is not new or different from his baseline lymphedema.  He has tried elevation and Tylenol.  Tylenol helps.  No aggravating factors.  No recent immobilization, surgery in the past 4 weeks, cough, hemoptysis, shortness of breath.  He denies open sores distally. Patient has a past medical history of diabetes, hyperlipidemia, hypertension, leukemia, peripheral vascular disease.  Per problem list, he has a history of chronic CHF, chronic kidney disease stage IIIb which he denies.  No history of DVT, PE or MRSA infections.  PCP: At [facility] [place].  He has follow-up with them next Friday.  Past Medical History:  Diagnosis Date   Diabetes mellitus without complication (HCC)    Hyperlipidemia    Hypertension    Leukemia (HCC)     Past Surgical History:  Procedure Laterality Date   CATARACT EXTRACTION Right    COLONOSCOPY WITH PROPOFOL N/A 12/13/2021   Procedure: COLONOSCOPY WITH PROPOFOL;  Surgeon: Regis Bill, MD;  Location: [facility] ENDOSCOPY;  Service: Endoscopy;  Laterality: N/A;   TONSILLECTOMY     TOOTH EXTRACTION      Family History  Problem Relation Age of Onset   Cancer Mother    Stroke Father    Cancer Sister     Social History   Tobacco Use   Smoking status: Former    Current packs/day: 0.00    Types: Cigarettes    Quit date: 2005    Years since quitting: 19.7   Smokeless tobacco: Never  Vaping Use   Vaping status: Never Used  Substance Use Topics   Alcohol use: No   Drug use: No    No current facility-administered medications for this encounter.  Current Outpatient Medications:    cephALEXin (KEFLEX) 500 MG  capsule, Take 1 capsule (500 mg total) by mouth 2 (two) times daily for 7 days., Disp: 14 capsule, Rfl: 0   doxycycline (VIBRAMYCIN) 100 MG capsule, Take 1 capsule (100 mg total) by mouth 2 (two) times daily for 7 days., Disp: 14 capsule, Rfl: 0   mupirocin ointment (BACTROBAN) 2 %, Apply 1 Application topically 2 (two) times daily., Disp: 22 g, Rfl: 0   aspirin 81 MG chewable tablet, Chew 81 mg by mouth daily., Disp: , Rfl:    bisoprolol (ZEBETA) 5 MG tablet, Take 1 tablet by mouth daily., Disp: , Rfl:    Cholecalciferol 25 MCG (1000 UT) tablet, Take 2 tablets by mouth daily., Disp: , Rfl:    Ensure Max Protein (ENSURE MAX PROTEIN) LIQD, Take 330 mLs (11 oz total) by mouth 2 (two) times daily., Disp: 330 mL, Rfl: 12   ferrous sulfate 325 (65 FE) MG EC tablet, Take 325 mg by mouth daily., Disp: , Rfl:    furosemide (LASIX) 20 MG tablet, Take 2 tablets (40 mg total) by mouth daily., Disp: 60 tablet, Rfl: 1   glimepiride (AMARYL) 1 MG tablet, Take 1 tablet by mouth daily., Disp: , Rfl:    hydrALAZINE (APRESOLINE) 25 MG tablet, Take 75 mg by mouth in the morning, at noon, and at bedtime., Disp: , Rfl:    lisinopril (ZESTRIL) 40 MG tablet,  TAKE ONE TABLET BY MOUTH EVERY DAY FOR BLOOD PRESSURE **REPLACES LISINOPRIL/HCTZ, Disp: , Rfl:    Multiple Vitamin (MULTIVITAMIN WITH MINERALS) TABS tablet, Take 1 tablet by mouth daily., Disp: 90 tablet, Rfl: 1   nystatin (MYCOSTATIN/NYSTOP) powder, Apply to toe crevices with unna wrap changes, Disp: 30 g, Rfl: 0   Omega-3 Fatty Acids (FISH OIL) 1000 MG CAPS, Take 1 capsule by mouth daily., Disp: , Rfl:    pioglitazone (ACTOS) 15 MG tablet, Take 15 mg by mouth daily., Disp: , Rfl:    pravastatin (PRAVACHOL) 80 MG tablet, Take 40 mg by mouth daily. daily, Disp: , Rfl:    sertraline (ZOLOFT) 100 MG tablet, Take 200 mg by mouth daily., Disp: , Rfl:    silver sulfADIAZINE (SILVADENE) 1 % cream, Apply to left ankle with unna wrap changes, Disp: 400 g, Rfl: 0  No Known  Allergies   ROS  As noted in HPI.   Physical Exam  BP (!) 166/90 (BP Location: Right Arm)   Pulse 63   Temp 98.6 F (37 C) (Oral)   SpO2 97%   Constitutional: Well developed, well nourished, no acute distress Eyes:  EOMI, conjunctiva normal bilaterally HENT: Normocephalic, atraumatic,mucus membranes moist Respiratory: Normal inspiratory effort Cardiovascular: Normal rate GI: nondistended skin: Extensive tender blanchable erythema, edema along the medial, posterior and lateral thigh extending to the top of the calf.  No popliteal tenderness.  Positive ulcer at the base of his left foot. Tender erythema, edema posterior left leg  Tender erythema extending up to the medial thigh.   Chronic stasis dermatitis with ulcer.      . Musculoskeletal: no deformities Neurologic: Alert & oriented x 3, no focal neuro deficits Psychiatric: Speech and behavior appropriate   ED Course   Medications  cefTRIAXone (ROCEPHIN) injection 1 g (1 g Intramuscular Given 01/30/23 1041)    Orders Placed This Encounter  Procedures   CBC with Differential    Standing Status:   Standing    Number of Occurrences:   1   Basic metabolic panel    Standing Status:   Standing    Number of Occurrences:   1    Results for orders placed or performed during the hospital encounter of 01/30/23 (from the past 24 hour(s))  CBC with Differential     Status: Abnormal   Collection Time: 01/30/23 10:31 AM  Result Value Ref Range   WBC 5.3 4.0 - 10.5 K/uL   RBC 3.95 (L) 4.22 - 5.81 MIL/uL   Hemoglobin 11.2 (L) 13.0 - 17.0 g/dL   HCT 52.8 (L) 41.3 - 24.4 %   MCV 89.1 80.0 - 100.0 fL   MCH 28.4 26.0 - 34.0 pg   MCHC 31.8 30.0 - 36.0 g/dL   RDW 01.0 (H) 27.2 - 53.6 %   Platelets 180 150 - 400 K/uL   nRBC 0.0 0.0 - 0.2 %   Neutrophils Relative % 65 %   Neutro Abs 3.5 1.7 - 7.7 K/uL   Lymphocytes Relative 20 %   Lymphs Abs 1.1 0.7 - 4.0 K/uL   Monocytes Relative 7 %   Monocytes Absolute 0.4 0.1 -  1.0 K/uL   Eosinophils Relative 6 %   Eosinophils Absolute 0.3 0.0 - 0.5 K/uL   Basophils Relative 1 %   Basophils Absolute 0.0 0.0 - 0.1 K/uL   Immature Granulocytes 1 %   Abs Immature Granulocytes 0.03 0.00 - 0.07 K/uL  Basic metabolic panel     Status: Abnormal  Collection Time: 01/30/23 10:31 AM  Result Value Ref Range   Sodium 140 135 - 145 mmol/L   Potassium 3.4 (L) 3.5 - 5.1 mmol/L   Chloride 99 98 - 111 mmol/L   CO2 29 22 - 32 mmol/L   Glucose, Bld 193 (H) 70 - 99 mg/dL   BUN 23 8 - 23 mg/dL   Creatinine, Ser 3.66 (H) 0.61 - 1.24 mg/dL   Calcium 9.3 8.9 - 44.0 mg/dL   GFR, Estimated 58 (L) >60 mL/min   Anion gap 12 5 - 15   No results found.  ED Clinical Impression  1. Cellulitis of leg, left   2. Lymphangitis of lower extremity   3. Hypokalemia      ED Assessment/Plan     1.  Concern for a cellulitis/lymphangitis.  Considered DVT, but I think this is less likely with the ulcer that he has of the base of his ankle/foot.  However, vitals are normal, he has not had any fevers, no antipyretic in the past 6 hours, will check a CBC, BMP.  If significantly abnormal, will send to the emergency department for further evaluation.  Plan to give 1000 mg of Rocephin here.  Calculated creatinine clearance from labs done in January 2023 100 mL/min  Will give 1 gm rocephin here.   Labs reviewed.  His kidney function is at baseline, he has no leukocytosis.  Potassium borderline low, but I believe this can be repleted orally.  Home with doxy and keflex x 1 week, bactroban over the wound appliy bid. He is to f/u with the wound care center at [facility].  He has already been evaluated multiple times by vascular, and states that his care has been given over to the wound care center.  He also has follow-up with his PCP next week.  Strict ER return precautions given to patient and family member.  2.  Hypokalemia.  Very mild.  Advised oral repletion.  Discussed labs,  MDM, treatment  plan, and plan for follow-up with patient. Discussed sn/sx that should prompt return to the ED. patient agrees with plan.   Meds ordered this encounter  Medications   cefTRIAXone (ROCEPHIN) injection 1 g   cephALEXin (KEFLEX) 500 MG capsule    Sig: Take 1 capsule (500 mg total) by mouth 2 (two) times daily for 7 days.    Dispense:  14 capsule    Refill:  0   doxycycline (VIBRAMYCIN) 100 MG capsule    Sig: Take 1 capsule (100 mg total) by mouth 2 (two) times daily for 7 days.    Dispense:  14 capsule    Refill:  0   mupirocin ointment (BACTROBAN) 2 %    Sig: Apply 1 Application topically 2 (two) times daily.    Dispense:  22 g    Refill:  0      *This clinic note was created using Scientist, clinical (histocompatibility and immunogenetics). Therefore, there may be occasional mistakes despite careful proofreading.  ?    Domenick Gong, MD 02/01/23 1418

## 2023-01-30 NOTE — Discharge Instructions (Signed)
I have given you 1000 mg of Rocephin here in the clinic.  Keep this clean with soap and water, and apply Bactroban once or twice a day.  Finish the doxycycline and Keflex, even if you feel better.  If you get worse or have not improved in the next 48 hours, I would go to the emergency department.  Try scheduling a follow-up appointment with the wound care center within the next week.  Please keep your PCP appointment as scheduled.

## 2023-02-17 ENCOUNTER — Ambulatory Visit: Payer: No Typology Code available for payment source | Admitting: Physician Assistant

## 2023-04-22 IMAGING — US US EXTREM LOW VENOUS
1 series · 13 of 24 positions shown · non-contrast
Comparison: Ultrasound venous left lower extremity 12/14/2018

CLINICAL DATA: Bilateral leg edema. Ulcerations. On anticoagulation
therapy.



[Series 1: us venous img lower bilat (dvt) · portal-venous · 13 of 58 slices shown]
[im 1/58]
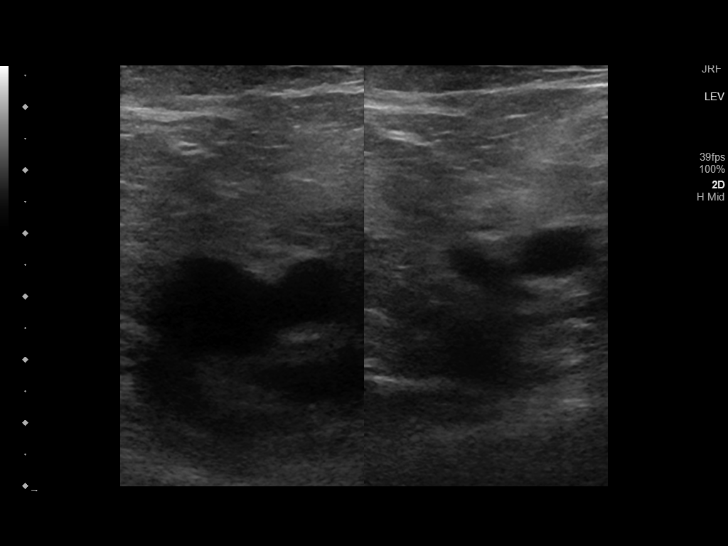
[im 5/58]
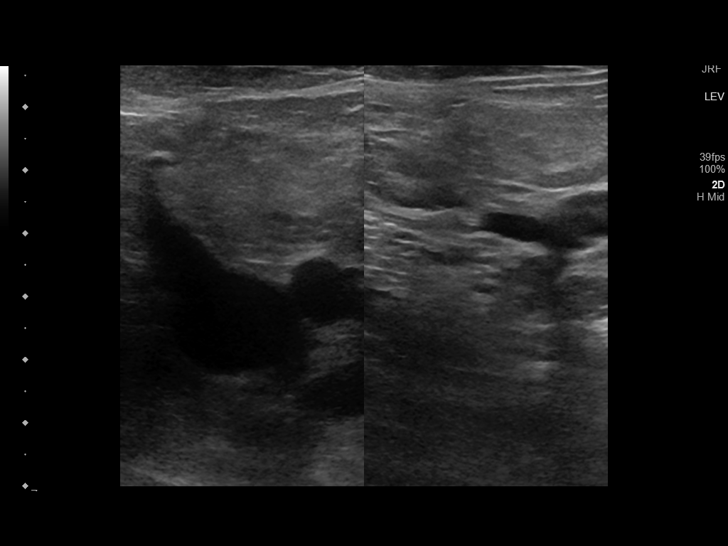
[im 10/58]
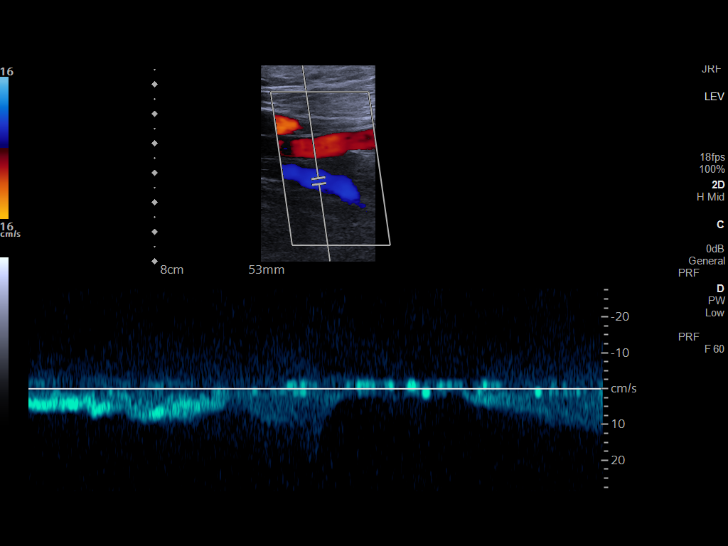
[im 15/58]
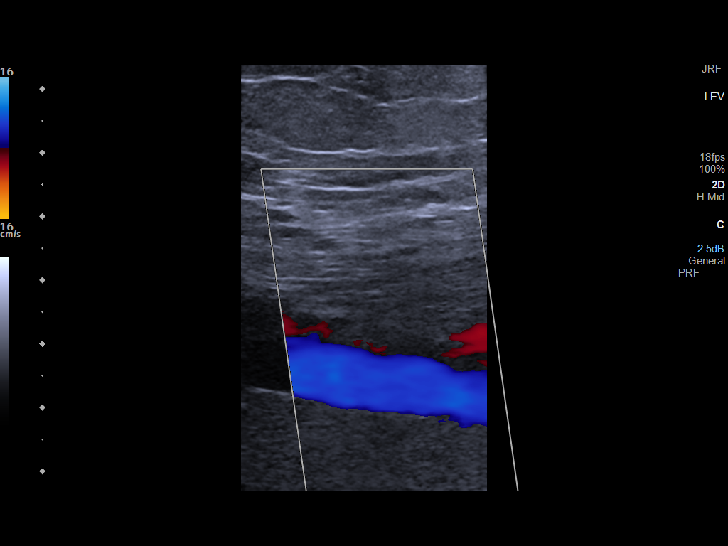
[im 20/58]
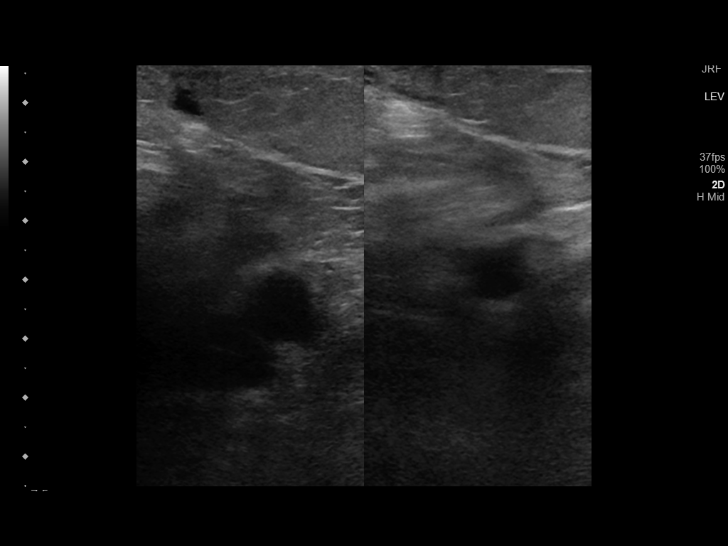
[im 25/58]
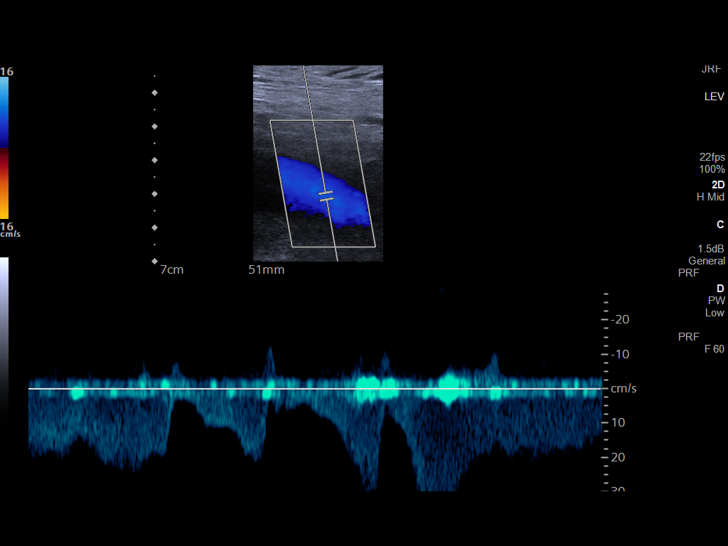
[im 30/58]
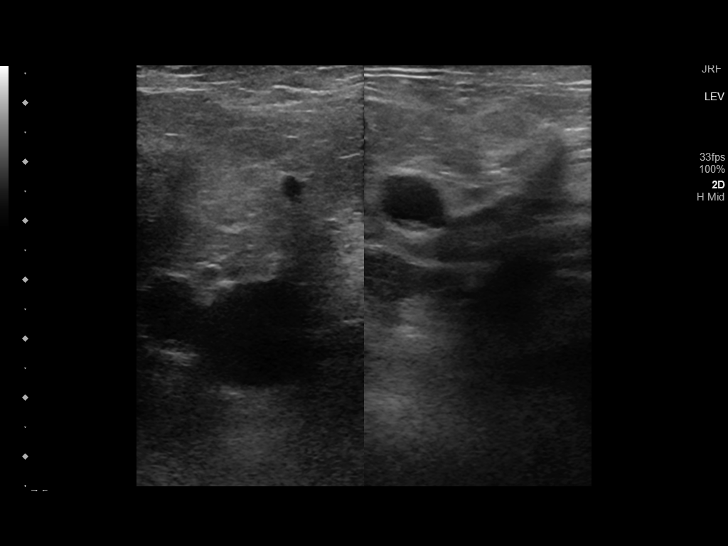
[im 33/58]
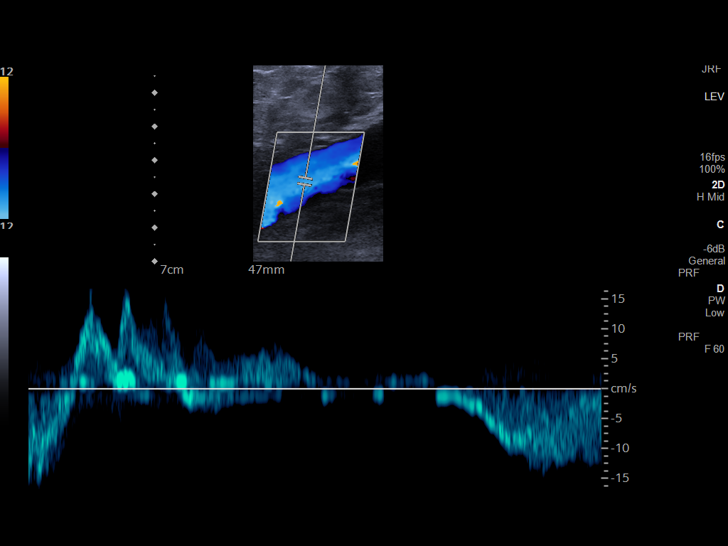
[im 38/58]
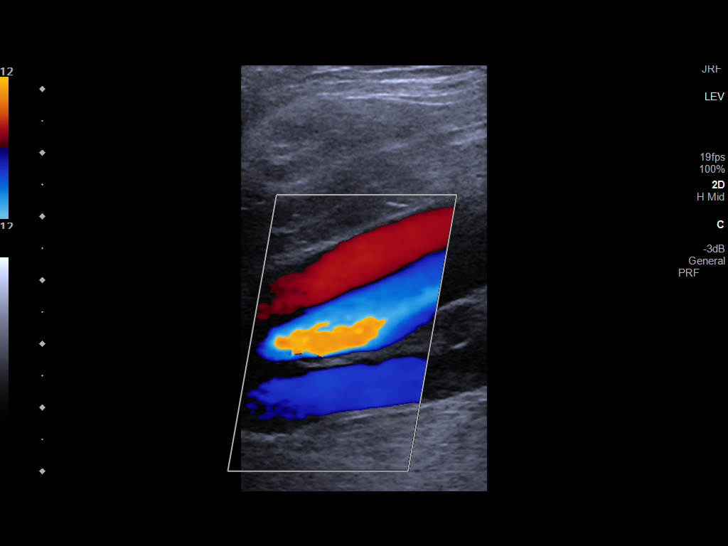
[im 43/58]
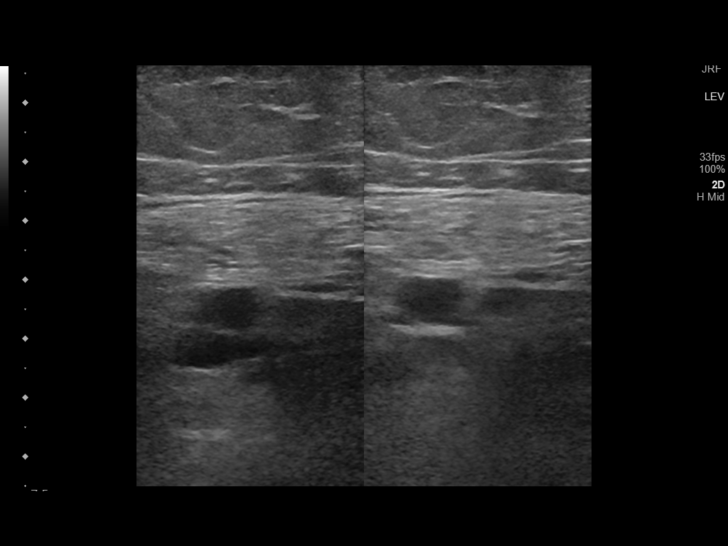
[im 48/58]
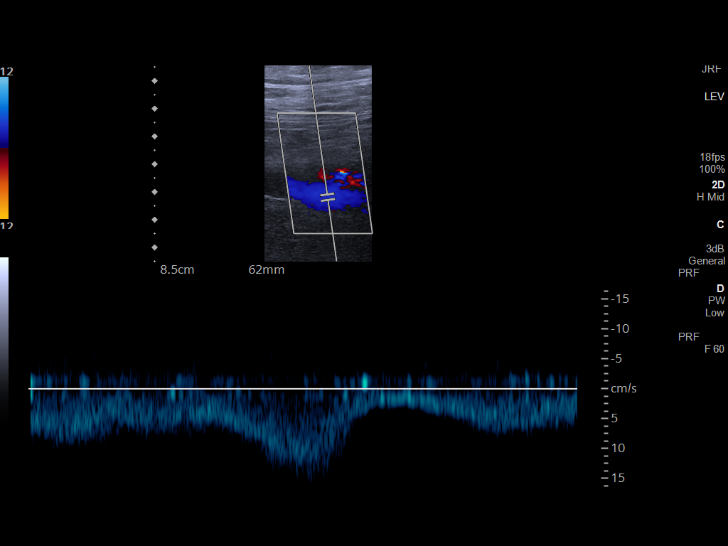
[im 53/58]
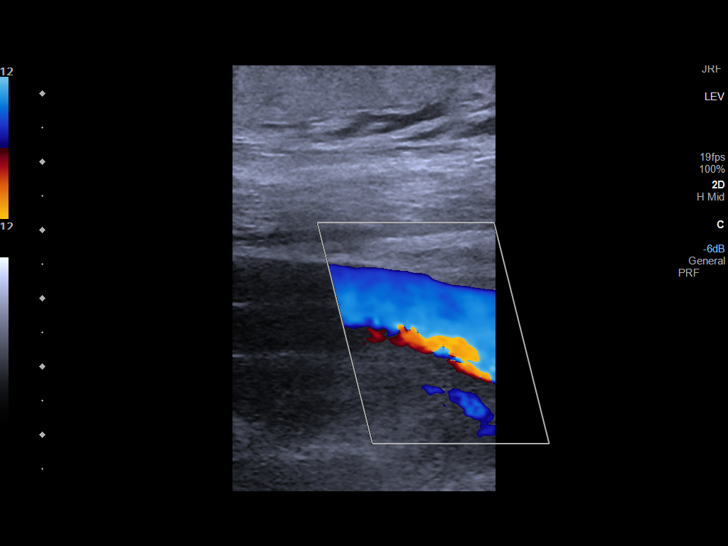
[im 58/58]
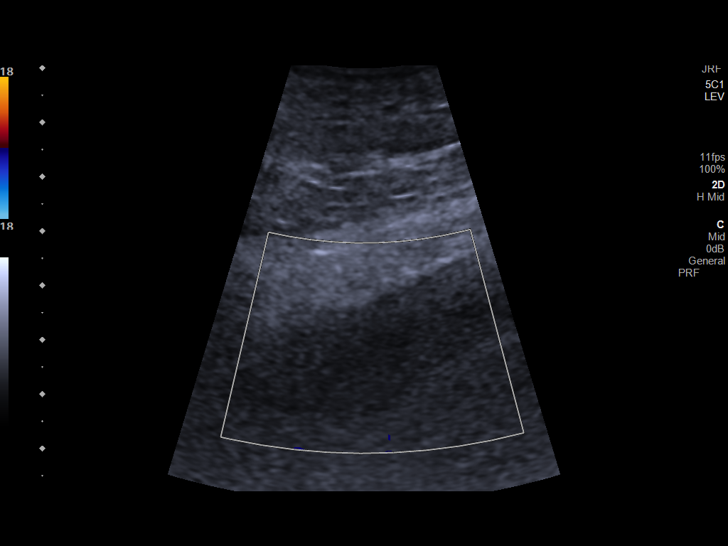

[13 of 24 positions shown; findings below may reference images not displayed]

FINDINGS: RIGHT LOWER EXTREMITY

Common Femoral Vein: No evidence of thrombus. Normal
compressibility, respiratory phasicity and response to augmentation.

Saphenofemoral Junction: No evidence of thrombus. Normal
compressibility and flow on color Doppler imaging.

Profunda Femoral Vein: No evidence of thrombus. Normal
compressibility and flow on color Doppler imaging.

Femoral Vein: No evidence of thrombus. Normal compressibility,
respiratory phasicity and response to augmentation.

Popliteal Vein: No evidence of thrombus. Normal compressibility,
respiratory phasicity and response to augmentation.

Calf Veins: Not visualized

Superficial Great Saphenous Vein: No evidence of thrombus. Normal
compressibility.

Venous Reflux:  None.

Other Findings:  None.

LEFT LOWER EXTREMITY

Common Femoral Vein: No evidence of thrombus. Normal
compressibility, respiratory phasicity and response to augmentation.

Saphenofemoral Junction: No evidence of thrombus. Normal
compressibility and flow on color Doppler imaging.

Profunda Femoral Vein: No evidence of thrombus. Normal
compressibility and flow on color Doppler imaging.

Femoral Vein: No evidence of thrombus. Normal compressibility,
respiratory phasicity and response to augmentation.

Popliteal Vein: No evidence of thrombus. Normal compressibility,
respiratory phasicity and response to augmentation.

Calf Veins: Not visualized.

Superficial Great Saphenous Vein: No evidence of thrombus. Normal
compressibility.

Venous Reflux:  None.

Other Findings:  Bilateral lower extremity edema.
IMPRESSION: 1. No evidence of deep venous thrombosis in either lower extremity.
2. Please note the calf veins are not visualized/evaluated due to
wounds and edema.

## 2023-08-17 ENCOUNTER — Encounter: Attending: Physician Assistant | Admitting: Physician Assistant

## 2023-08-17 DIAGNOSIS — I890 Lymphedema, not elsewhere classified: Secondary | ICD-10-CM | POA: Insufficient documentation

## 2023-08-17 DIAGNOSIS — L97812 Non-pressure chronic ulcer of other part of right lower leg with fat layer exposed: Secondary | ICD-10-CM | POA: Diagnosis not present

## 2023-08-17 DIAGNOSIS — I87333 Chronic venous hypertension (idiopathic) with ulcer and inflammation of bilateral lower extremity: Secondary | ICD-10-CM | POA: Insufficient documentation

## 2023-08-17 DIAGNOSIS — E11622 Type 2 diabetes mellitus with other skin ulcer: Secondary | ICD-10-CM | POA: Diagnosis present

## 2023-08-17 DIAGNOSIS — L97822 Non-pressure chronic ulcer of other part of left lower leg with fat layer exposed: Secondary | ICD-10-CM | POA: Insufficient documentation

## 2023-08-17 DIAGNOSIS — I10 Essential (primary) hypertension: Secondary | ICD-10-CM | POA: Insufficient documentation

## 2023-08-21 ENCOUNTER — Encounter

## 2023-08-21 DIAGNOSIS — E11622 Type 2 diabetes mellitus with other skin ulcer: Secondary | ICD-10-CM | POA: Diagnosis not present

## 2023-08-23 ENCOUNTER — Encounter: Admitting: Physician Assistant

## 2023-08-23 DIAGNOSIS — E11622 Type 2 diabetes mellitus with other skin ulcer: Secondary | ICD-10-CM | POA: Diagnosis not present

## 2023-08-28 ENCOUNTER — Encounter

## 2023-08-28 DIAGNOSIS — E11622 Type 2 diabetes mellitus with other skin ulcer: Secondary | ICD-10-CM | POA: Diagnosis not present

## 2023-08-31 ENCOUNTER — Encounter: Admitting: Physician Assistant

## 2023-08-31 DIAGNOSIS — E11622 Type 2 diabetes mellitus with other skin ulcer: Secondary | ICD-10-CM | POA: Diagnosis not present

## 2023-09-04 ENCOUNTER — Ambulatory Visit

## 2023-09-07 ENCOUNTER — Encounter: Attending: Physician Assistant | Admitting: Physician Assistant

## 2023-09-07 DIAGNOSIS — L97822 Non-pressure chronic ulcer of other part of left lower leg with fat layer exposed: Secondary | ICD-10-CM | POA: Diagnosis not present

## 2023-09-07 DIAGNOSIS — I87333 Chronic venous hypertension (idiopathic) with ulcer and inflammation of bilateral lower extremity: Secondary | ICD-10-CM | POA: Diagnosis not present

## 2023-09-07 DIAGNOSIS — L97812 Non-pressure chronic ulcer of other part of right lower leg with fat layer exposed: Secondary | ICD-10-CM | POA: Diagnosis not present

## 2023-09-07 DIAGNOSIS — I10 Essential (primary) hypertension: Secondary | ICD-10-CM | POA: Insufficient documentation

## 2023-09-07 DIAGNOSIS — E11622 Type 2 diabetes mellitus with other skin ulcer: Secondary | ICD-10-CM | POA: Insufficient documentation

## 2023-09-07 DIAGNOSIS — I890 Lymphedema, not elsewhere classified: Secondary | ICD-10-CM | POA: Insufficient documentation

## 2023-09-14 ENCOUNTER — Encounter: Admitting: Physician Assistant

## 2023-09-14 DIAGNOSIS — E11622 Type 2 diabetes mellitus with other skin ulcer: Secondary | ICD-10-CM | POA: Diagnosis not present

## 2023-09-21 ENCOUNTER — Encounter: Admitting: Physician Assistant

## 2023-09-21 DIAGNOSIS — E11622 Type 2 diabetes mellitus with other skin ulcer: Secondary | ICD-10-CM | POA: Diagnosis not present

## 2023-09-28 ENCOUNTER — Encounter: Admitting: Physician Assistant

## 2023-09-28 DIAGNOSIS — E11622 Type 2 diabetes mellitus with other skin ulcer: Secondary | ICD-10-CM | POA: Diagnosis not present

## 2023-10-12 ENCOUNTER — Encounter: Attending: Physician Assistant | Admitting: Physician Assistant

## 2023-10-12 DIAGNOSIS — E11622 Type 2 diabetes mellitus with other skin ulcer: Secondary | ICD-10-CM | POA: Insufficient documentation

## 2023-10-12 DIAGNOSIS — L97822 Non-pressure chronic ulcer of other part of left lower leg with fat layer exposed: Secondary | ICD-10-CM | POA: Insufficient documentation

## 2023-10-12 DIAGNOSIS — I87333 Chronic venous hypertension (idiopathic) with ulcer and inflammation of bilateral lower extremity: Secondary | ICD-10-CM | POA: Diagnosis not present

## 2023-10-12 DIAGNOSIS — L97812 Non-pressure chronic ulcer of other part of right lower leg with fat layer exposed: Secondary | ICD-10-CM | POA: Insufficient documentation

## 2023-10-12 DIAGNOSIS — I890 Lymphedema, not elsewhere classified: Secondary | ICD-10-CM | POA: Diagnosis not present

## 2023-10-12 DIAGNOSIS — I10 Essential (primary) hypertension: Secondary | ICD-10-CM | POA: Diagnosis not present

## 2023-10-19 ENCOUNTER — Encounter: Admitting: Physician Assistant

## 2023-10-19 DIAGNOSIS — E11622 Type 2 diabetes mellitus with other skin ulcer: Secondary | ICD-10-CM | POA: Diagnosis not present

## 2023-11-02 ENCOUNTER — Encounter: Admitting: Physician Assistant

## 2023-11-09 ENCOUNTER — Other Ambulatory Visit: Payer: Self-pay | Admitting: Physician Assistant

## 2023-11-09 ENCOUNTER — Encounter: Attending: Physician Assistant | Admitting: Physician Assistant

## 2023-11-09 ENCOUNTER — Ambulatory Visit
Admission: RE | Admit: 2023-11-09 | Discharge: 2023-11-09 | Disposition: A | Discharge status: Home / Self Care | Source: Ambulatory Visit | Attending: Physician Assistant | Admitting: Physician Assistant

## 2023-11-09 DIAGNOSIS — I10 Essential (primary) hypertension: Secondary | ICD-10-CM | POA: Insufficient documentation

## 2023-11-09 DIAGNOSIS — L97812 Non-pressure chronic ulcer of other part of right lower leg with fat layer exposed: Secondary | ICD-10-CM | POA: Insufficient documentation

## 2023-11-09 DIAGNOSIS — I87333 Chronic venous hypertension (idiopathic) with ulcer and inflammation of bilateral lower extremity: Secondary | ICD-10-CM | POA: Diagnosis not present

## 2023-11-09 DIAGNOSIS — S82391A Other fracture of lower end of right tibia, initial encounter for closed fracture: Secondary | ICD-10-CM | POA: Diagnosis present

## 2023-11-09 DIAGNOSIS — I890 Lymphedema, not elsewhere classified: Secondary | ICD-10-CM | POA: Insufficient documentation

## 2023-11-09 DIAGNOSIS — E11622 Type 2 diabetes mellitus with other skin ulcer: Secondary | ICD-10-CM | POA: Diagnosis present

## 2023-11-09 DIAGNOSIS — L97822 Non-pressure chronic ulcer of other part of left lower leg with fat layer exposed: Secondary | ICD-10-CM | POA: Diagnosis not present

## 2023-11-20 ENCOUNTER — Encounter: Admitting: Physician Assistant

## 2023-11-20 DIAGNOSIS — E11622 Type 2 diabetes mellitus with other skin ulcer: Secondary | ICD-10-CM | POA: Diagnosis not present

## 2023-11-24 ENCOUNTER — Inpatient Hospital Stay
Admission: AD | Admit: 2023-11-24 | Discharge: 2023-11-29 | DRG: 492 | Disposition: A | Discharge status: Skilled Nursing Facility | Source: Ambulatory Visit | Attending: Family Medicine | Admitting: Family Medicine

## 2023-11-24 ENCOUNTER — Encounter (HOSPITAL_COMMUNITY): Payer: Self-pay

## 2023-11-24 ENCOUNTER — Inpatient Hospital Stay

## 2023-11-24 ENCOUNTER — Ambulatory Visit (INDEPENDENT_AMBULATORY_CARE_PROVIDER_SITE_OTHER)

## 2023-11-24 ENCOUNTER — Encounter: Payer: Self-pay | Admitting: Podiatry

## 2023-11-24 ENCOUNTER — Ambulatory Visit: Admitting: Podiatry

## 2023-11-24 VITALS — Ht 73.0 in | Wt 315.0 lb

## 2023-11-24 DIAGNOSIS — F32A Depression, unspecified: Secondary | ICD-10-CM | POA: Diagnosis present

## 2023-11-24 DIAGNOSIS — Z87891 Personal history of nicotine dependence: Secondary | ICD-10-CM | POA: Diagnosis not present

## 2023-11-24 DIAGNOSIS — I130 Hypertensive heart and chronic kidney disease with heart failure and stage 1 through stage 4 chronic kidney disease, or unspecified chronic kidney disease: Secondary | ICD-10-CM | POA: Diagnosis present

## 2023-11-24 DIAGNOSIS — R262 Difficulty in walking, not elsewhere classified: Secondary | ICD-10-CM | POA: Diagnosis present

## 2023-11-24 DIAGNOSIS — E785 Hyperlipidemia, unspecified: Secondary | ICD-10-CM | POA: Diagnosis present

## 2023-11-24 DIAGNOSIS — J9589 Other postprocedural complications and disorders of respiratory system, not elsewhere classified: Secondary | ICD-10-CM | POA: Diagnosis not present

## 2023-11-24 DIAGNOSIS — I10 Essential (primary) hypertension: Secondary | ICD-10-CM | POA: Diagnosis present

## 2023-11-24 DIAGNOSIS — E1122 Type 2 diabetes mellitus with diabetic chronic kidney disease: Secondary | ICD-10-CM | POA: Diagnosis present

## 2023-11-24 DIAGNOSIS — W19XXXA Unspecified fall, initial encounter: Secondary | ICD-10-CM | POA: Diagnosis present

## 2023-11-24 DIAGNOSIS — Z66 Do not resuscitate: Secondary | ICD-10-CM | POA: Diagnosis present

## 2023-11-24 DIAGNOSIS — I890 Lymphedema, not elsewhere classified: Secondary | ICD-10-CM

## 2023-11-24 DIAGNOSIS — M25571 Pain in right ankle and joints of right foot: Secondary | ICD-10-CM | POA: Diagnosis present

## 2023-11-24 DIAGNOSIS — S93431A Sprain of tibiofibular ligament of right ankle, initial encounter: Secondary | ICD-10-CM | POA: Diagnosis present

## 2023-11-24 DIAGNOSIS — R5381 Other malaise: Secondary | ICD-10-CM | POA: Diagnosis present

## 2023-11-24 DIAGNOSIS — F329 Major depressive disorder, single episode, unspecified: Secondary | ICD-10-CM | POA: Diagnosis present

## 2023-11-24 DIAGNOSIS — S82841S Displaced bimalleolar fracture of right lower leg, sequela: Secondary | ICD-10-CM | POA: Diagnosis not present

## 2023-11-24 DIAGNOSIS — J9601 Acute respiratory failure with hypoxia: Secondary | ICD-10-CM | POA: Diagnosis not present

## 2023-11-24 DIAGNOSIS — E1165 Type 2 diabetes mellitus with hyperglycemia: Secondary | ICD-10-CM | POA: Diagnosis present

## 2023-11-24 DIAGNOSIS — S82852A Displaced trimalleolar fracture of left lower leg, initial encounter for closed fracture: Secondary | ICD-10-CM | POA: Diagnosis not present

## 2023-11-24 DIAGNOSIS — Z79899 Other long term (current) drug therapy: Secondary | ICD-10-CM

## 2023-11-24 DIAGNOSIS — I5032 Chronic diastolic (congestive) heart failure: Secondary | ICD-10-CM | POA: Diagnosis present

## 2023-11-24 DIAGNOSIS — Z856 Personal history of leukemia: Secondary | ICD-10-CM | POA: Diagnosis not present

## 2023-11-24 DIAGNOSIS — Y92009 Unspecified place in unspecified non-institutional (private) residence as the place of occurrence of the external cause: Secondary | ICD-10-CM

## 2023-11-24 DIAGNOSIS — S82851A Displaced trimalleolar fracture of right lower leg, initial encounter for closed fracture: Secondary | ICD-10-CM | POA: Diagnosis present

## 2023-11-24 DIAGNOSIS — D631 Anemia in chronic kidney disease: Secondary | ICD-10-CM | POA: Diagnosis present

## 2023-11-24 DIAGNOSIS — E1151 Type 2 diabetes mellitus with diabetic peripheral angiopathy without gangrene: Secondary | ICD-10-CM | POA: Diagnosis present

## 2023-11-24 DIAGNOSIS — E1129 Type 2 diabetes mellitus with other diabetic kidney complication: Secondary | ICD-10-CM | POA: Diagnosis present

## 2023-11-24 DIAGNOSIS — Z7982 Long term (current) use of aspirin: Secondary | ICD-10-CM | POA: Diagnosis not present

## 2023-11-24 DIAGNOSIS — S82841B Displaced bimalleolar fracture of right lower leg, initial encounter for open fracture type I or II: Secondary | ICD-10-CM | POA: Diagnosis not present

## 2023-11-24 DIAGNOSIS — I739 Peripheral vascular disease, unspecified: Secondary | ICD-10-CM | POA: Diagnosis present

## 2023-11-24 DIAGNOSIS — Z7984 Long term (current) use of oral hypoglycemic drugs: Secondary | ICD-10-CM | POA: Diagnosis not present

## 2023-11-24 DIAGNOSIS — R001 Bradycardia, unspecified: Secondary | ICD-10-CM | POA: Diagnosis not present

## 2023-11-24 DIAGNOSIS — N1831 Chronic kidney disease, stage 3a: Principal | ICD-10-CM | POA: Diagnosis present

## 2023-11-24 DIAGNOSIS — J9811 Atelectasis: Secondary | ICD-10-CM | POA: Diagnosis not present

## 2023-11-24 DIAGNOSIS — N183 Chronic kidney disease, stage 3 unspecified: Secondary | ICD-10-CM | POA: Diagnosis present

## 2023-11-24 LAB — TYPE AND SCREEN
ABO/RH(D): A POS
Antibody Screen: NEGATIVE

## 2023-11-24 LAB — GLUCOSE, CAPILLARY: Glucose-Capillary: 174 mg/dL — ABNORMAL HIGH (ref 70–99)

## 2023-11-24 MED ORDER — SERTRALINE HCL 50 MG PO TABS
200.0000 mg | ORAL_TABLET | Freq: Every day | ORAL | Status: DC
  Administered 2023-11-26 – 2023-11-29 (×4): 200 mg via ORAL
  Filled 2023-11-24 (×5): qty 4

## 2023-11-24 MED ORDER — HYDROCODONE-ACETAMINOPHEN 5-325 MG PO TABS
1.0000 | ORAL_TABLET | Freq: Four times a day (QID) | ORAL | Status: DC | PRN
  Filled 2023-11-24: qty 2

## 2023-11-24 MED ORDER — BISACODYL 5 MG PO TBEC: 5.0000 mg | DELAYED_RELEASE_TABLET | Freq: Every day | ORAL | Status: DC | PRN

## 2023-11-24 MED ORDER — VITAMIN D 25 MCG (1000 UNIT) PO TABS
2000.0000 [IU] | ORAL_TABLET | Freq: Every day | ORAL | Status: DC
  Administered 2023-11-26 – 2023-11-29 (×4): 2000 [IU] via ORAL
  Filled 2023-11-24 (×4): qty 2

## 2023-11-24 MED ORDER — ENOXAPARIN SODIUM 80 MG/0.8ML IJ SOSY
0.5000 mg/kg | PREFILLED_SYRINGE | INTRAMUSCULAR | Status: DC
  Administered 2023-11-25 – 2023-11-28 (×4): 72.5 mg via SUBCUTANEOUS
  Filled 2023-11-24 (×7): qty 0.72

## 2023-11-24 MED ORDER — BISOPROLOL FUMARATE 5 MG PO TABS
5.0000 mg | ORAL_TABLET | Freq: Every day | ORAL | Status: DC
  Filled 2023-11-24: qty 1

## 2023-11-24 MED ORDER — ASPIRIN 81 MG PO CHEW: 81.0000 mg | CHEWABLE_TABLET | Freq: Every day | ORAL | Status: DC

## 2023-11-24 MED ORDER — LISINOPRIL 20 MG PO TABS: 40.0000 mg | ORAL_TABLET | Freq: Every day | ORAL | Status: DC

## 2023-11-24 MED ORDER — SODIUM CHLORIDE 0.9 % IV SOLN: INTRAVENOUS | Status: DC

## 2023-11-24 MED ORDER — HYDRALAZINE HCL 50 MG PO TABS: 50.0000 mg | ORAL_TABLET | Freq: Three times a day (TID) | ORAL | Status: DC

## 2023-11-24 MED ORDER — OXYCODONE HCL 5 MG PO TABS: 5.0000 mg | ORAL_TABLET | ORAL | Status: DC | PRN

## 2023-11-24 MED ORDER — PRAVASTATIN SODIUM 20 MG PO TABS
40.0000 mg | ORAL_TABLET | Freq: Every day | ORAL | Status: DC
  Administered 2023-11-26 – 2023-11-29 (×4): 40 mg via ORAL
  Filled 2023-11-24 (×4): qty 2

## 2023-11-24 MED ORDER — SENNOSIDES-DOCUSATE SODIUM 8.6-50 MG PO TABS: 1.0000 | ORAL_TABLET | Freq: Every evening | ORAL | Status: DC | PRN

## 2023-11-24 MED ORDER — INSULIN ASPART 100 UNIT/ML IJ SOLN
0.0000 [IU] | Freq: Three times a day (TID) | INTRAMUSCULAR | Status: DC
  Administered 2023-11-25: 3 [IU] via SUBCUTANEOUS
  Administered 2023-11-25: 5 [IU] via SUBCUTANEOUS
  Administered 2023-11-26 – 2023-11-27 (×6): 2 [IU] via SUBCUTANEOUS
  Administered 2023-11-28: 3 [IU] via SUBCUTANEOUS
  Administered 2023-11-28 (×2): 2 [IU] via SUBCUTANEOUS
  Administered 2023-11-29 (×2): 1 [IU] via SUBCUTANEOUS
  Filled 2023-11-24 (×12): qty 1

## 2023-11-24 NOTE — H&P (Signed)
 History and Physical    Patient: [name] FMW:[id number] DOB: [birth year] DOA: 11/24/2023 DOS: the patient was seen and examined on 11/24/2023 PCP: Dexter Harlene BIRCH, PA-C  Patient coming from: Home  Chief Complaint: No chief complaint on file.  HPI: [name] is a 73 y.o. male with medical history significant of morbid obesity, hypertension, type 2 diabetes mellitus, lymphedema, grade 2 diastolic dysfunction, CKD stage IIIa who was referred to the emergency room by [facility] for elective trimalleolar fracture repair.  Patient apparently had an injury about 3 weeks ago after he fell at home.  He was previously seen and evaluated by [facility] and was recommended conservative management with boot.  Patient continues to have significant pain with ambulation and decided to sought second opinion with [facility] today.  CT scan done of the ankle joint revealed oblique displaced trimalleolar fracture.  Patient was referred to the hospital for admission with plans for ORIF scheduled for tomorrow AM.  Patient denies any history of coronary disease.  At baseline, he was able to ambulate without any exertional dyspnea prior to the injury.  No recent chest pain. EKG has been ordered and pending. Review of Systems: As mentioned in the history of present illness. All other systems reviewed and are negative. Past Medical History:  Diagnosis Date   Diabetes mellitus without complication (HCC)    Hyperlipidemia    Hypertension    Leukemia (HCC)    Past Surgical History:  Procedure Laterality Date   CATARACT EXTRACTION Right    COLONOSCOPY WITH PROPOFOL  N/A 12/13/2021   Procedure: COLONOSCOPY WITH PROPOFOL ;  Surgeon: [name], MD;  Location: [facility] ENDOSCOPY;  Service: Endoscopy;  Laterality: N/A;   TONSILLECTOMY     TOOTH EXTRACTION     Social History:  reports that he quit smoking about 20 years ago. His smoking use included cigarettes. He has never  used smokeless tobacco. He reports that he does not drink alcohol and does not use drugs.  No Known Allergies  Family History  Problem Relation Age of Onset   Cancer Mother    Stroke Father    Cancer Sister     Prior to Admission medications   Medication Sig Start Date End Date Taking? Authorizing Provider  aspirin  81 MG chewable tablet Chew 81 mg by mouth daily.    [provider]  bisoprolol  (ZEBETA ) 5 MG tablet Take 1 tablet by mouth daily. 06/15/20   [provider]  Cholecalciferol  25 MCG (1000 UT) tablet Take 2 tablets by mouth daily. 09/11/14   [provider]  Ensure Max Protein (ENSURE MAX PROTEIN) LIQD Take 330 mLs (11 oz total) by mouth 2 (two) times daily. 12/04/20   Amin, Sumayya, MD  ferrous sulfate  325 (65 FE) MG EC tablet Take 325 mg by mouth daily.    [provider]  furosemide  (LASIX ) 20 MG tablet Take 2 tablets (40 mg total) by mouth daily. 04/30/21 11/24/23  Darron Deatrice LABOR, MD  glimepiride (AMARYL) 1 MG tablet Take 1 tablet by mouth daily. 02/23/21   [provider]  hydrALAZINE  (APRESOLINE ) 25 MG tablet Take 75 mg by mouth in the morning, at noon, and at bedtime. 04/12/21   [provider]  lisinopril  (ZESTRIL ) 40 MG tablet TAKE ONE TABLET BY MOUTH EVERY DAY FOR BLOOD PRESSURE **REPLACES LISINOPRIL /HCTZ 04/13/20   [provider]  Multiple Vitamin (MULTIVITAMIN WITH MINERALS) TABS tablet Take 1 tablet by mouth daily. 12/04/20   Amin,  Sumayya, MD  mupirocin  ointment (BACTROBAN ) 2 % Apply 1 Application topically 2 (two) times daily. 01/30/23   Van Knee, MD  nystatin  (MYCOSTATIN /NYSTOP ) powder Apply to toe crevices with unna wrap changes 07/31/20   Brown, Fallon E, NP  Omega-3 Fatty Acids (FISH OIL) 1000 MG CAPS Take 1 capsule by mouth daily.    [provider]  pioglitazone (ACTOS) 15 MG tablet Take 15 mg by mouth daily.    [provider]  pravastatin  (PRAVACHOL ) 80 MG tablet Take 40 mg  by mouth daily. daily    [provider]  sertraline  (ZOLOFT ) 100 MG tablet Take 200 mg by mouth daily.    [provider]  silver  sulfADIAZINE  (SILVADENE ) 1 % cream Apply to left ankle with unna wrap changes 07/31/20   Delores Orvin BRAVO, NP    Physical Exam: Vitals:   11/24/23 1840 11/24/23 2012  BP: (!) 101/42 126/63  Pulse: 64 62  Resp: 14 16  Temp: 98.1 F (36.7 C) 98 F (36.7 C)  TempSrc:  Oral  SpO2: 94% 96%   General: Patient is an obese Caucasian gentleman.  he is hard of hearing. HEENT: Oral mucosa moist Neck: Supple with no JVD Chest: Clinically diminished bilaterally with no added sounds. Cardiovascular regular S1-S2 with no murmur. Abdomen: Soft nontender with no organomegaly Extremities with right boot in place.  Left lower extremity significant for lymphedema with Ace wrap in place. Skin negative for any new rash. CNS shows no focal deficit. Data Reviewed: Sodium is 140, potassium 3.8, chloride 106, bicarb 27, glucose 218, BUN 30, creatinine 1.30 calcium 9 AST 24 ALT 21 WBC 6.1 hemoglobin 8.8, hematocrit 29.8, platelet count 204 neutrophil: 69  1Oblique displaced fracture of the distal fibular diaphysis with the distal fracture margin extending to the proximal aspect of the distal tibiofibular syndesmosis. 2. Mildly displaced posterior malleolar fracture. 3. Minimally displaced avulsion fracture along the expected course of the deltoid ligamentous complex at the anteromedial ankle. 4. Avulsion fracture along the expected course of the anterior talofibular ligament at the anterolateral ankle.  Assessment and Plan: 73 year old obese gentleman with type 2 diabetes mellitus, hypertension, CKD stage IIIa, diastolic heart failure, presents on account of right ankle injury on 10/09/2023.  Patient has had difficulty ambulating on same.  Was seen and assessed by foot and ankle doctor today and recommended ORIF.  Patient was admitted directly from the office in  preparation for procedure in AM.  Patient will be kept n.p.o. in anticipation  #1.  Subacute trimalleolar fracture: Seen and assessed by [facility].  Patient was referred to hospital for open reduction internal fixation scheduled for a.m.  Patient will be assessed postop for his needs and disposition.  Will optimize pain management with oxycodone.  #2.  Chronic anemia.  Likely due to anemia of chronic kidney disease.  Will send anemia workup.  Type and screen has been ordered.  No indication for PRBC transfusion at this time  #3.  Essential hypertension with possible underlying hypertensive heart disease characterized by grade 2 diastolic dysfunction.  Resume home medications including bisoprolol  and hydralazine .  Monitor blood pressure per protocol.  #4.  Type 2 diabetes mellitus.  Will order for A1c.  Fingerstick glucose monitoring per unit protocol.  Insulin  sliding scale has been instituted.  Will hold all oral diabetes medications.  #5.  Dyslipidemia: Patient will continue with his statin  #6.  History of depression on Zoloft .  Continue with same.  #7.  History of  bilateral lymphedema.  Previously been on Lasix  but admits currently not being on same due to being being taken off   #8.  Remote history of leukemia   Advance Care Planning:   Code Status: Full Code   Consults:   Family Communication: To be determined  Severity of Illness: The appropriate patient status for this patient is INPATIENT. Inpatient status is judged to be reasonable and necessary in order to provide the required intensity of service to ensure the patient's safety. The patient's presenting symptoms, physical exam findings, and initial radiographic and laboratory data in the context of their chronic comorbidities is felt to place them at high risk for further clinical deterioration. Furthermore, it is not anticipated that the patient will be medically stable for discharge from the hospital within 2  midnights of admission.   * I certify that at the point of admission it is my clinical judgment that the patient will require inpatient hospital care spanning beyond 2 midnights from the point of admission due to high intensity of service, high risk for further deterioration and high frequency of surveillance required.*  Author: Maude MARLA Dart, MD 11/24/2023 9:25 PM  For on call review [email or web address].

## 2023-11-24 NOTE — Progress Notes (Addendum)
 No chief complaint on file.   HPI: 73 y.o. male PMHx T2DM, HTN, lymphedema, CHF, CKD no blood thinners presenting today for evaluation and second opinion of ankle fracture to the right lower extremity.  Patient referred from the wound care center.  Brief history: Patient reports initial fall injury in his bathroom at home on 10/31/2023.  Denies any head trauma or loss of consciousness.  The trauma was isolated to the foot and ankle.  He did not go to the emergency department.  He was ultimately referred to [facility] where x-rays and CT scan were ordered.  He followed up with an orthopedic surgeon at [facility] on 11/14/2023 I did not recommend surgery at that time.  While at that office visit he sustained another fall injury in the x-ray room according to the patient and his cousin who is present today.  He continues to have instability and pain and tenderness associated to the right lower extremity.  He was recently discharged at the wound care center for venous leg ulcers but at the time of fall while in the doctor's office he reopened a superficial abrasion to the right leg.  At the wound care center there were concern for the ankle fracture and referred here for second opinion.  Past Medical History:  Diagnosis Date   Diabetes mellitus without complication (HCC)    Hyperlipidemia    Hypertension    Leukemia (HCC)     Past Surgical History:  Procedure Laterality Date   CATARACT EXTRACTION Right    COLONOSCOPY WITH PROPOFOL  N/A 12/13/2021   Procedure: COLONOSCOPY WITH PROPOFOL ;  Surgeon: [name], MD;  Location: [facility] ENDOSCOPY;  Service: Endoscopy;  Laterality: N/A;   TONSILLECTOMY     TOOTH EXTRACTION      No Known Allergies   Physical Exam: General: The patient is alert and oriented x3 in no acute distress.  Dermatology: Superficial abrasion and venous ulcer noted to the right leg proximal to the ankle.  Dressings were left intact today  Vascular: Chronic lower  extremity lymphedema bilateral.  Venous insufficiency reported  VAS US  ABI WITH/WO TBI 04/11/2019 ABI Findings:  +---------+------------------+-----+---------+--------+  Right   Rt Pressure (mmHg)IndexWaveform Comment   +---------+------------------+-----+---------+--------+  Brachial 164                                       +---------+------------------+-----+---------+--------+  ATA     175               1.07 triphasic          +---------+------------------+-----+---------+--------+  PTA     178               1.09 triphasic          +---------+------------------+-----+---------+--------+  Great Toe149               0.91 Normal             +---------+------------------+-----+---------+--------+    +---------+------------------+-----+---------+-------+  Left    Lt Pressure (mmHg)IndexWaveform Comment  +---------+------------------+-----+---------+-------+  Brachial 154                                      +---------+------------------+-----+---------+-------+  ATA     162               0.99 triphasic         +---------+------------------+-----+---------+-------+  PTA     170               1.04 triphasic         +---------+------------------+-----+---------+-------+  Great Toe137               0.84 Normal            +---------+------------------+-----+---------+-------+   +-------+-----------+-----------+------------+------------+  ABI/TBIToday's ABIToday's TBIPrevious ABIPrevious TBI  +-------+-----------+-----------+------------+------------+  Right 1.09       .91                                  +-------+-----------+-----------+------------+------------+  Left  1.04       .84                                  +-------+-----------+-----------+------------+------------+  Summary:  Right: Resting right ankle-brachial index is within normal range. No  evidence of significant right lower extremity arterial  disease. The right  toe-brachial index is normal.  Left: Resting left ankle-brachial index is within normal range. No  evidence of significant left lower extremity arterial disease. The left  toe-brachial index is normal.   Neurological: Grossly intact via light touch  Musculoskeletal Exam: Prior to the ankle fracture patient was ambulatory  Radiographic Exam RT ankle 11/24/2023:  Marinda Patient C  laterally displaced oblique distal fibular fracture proximal to the tibiotalar joint.  Laterally displaced tibiotalar joint with lateral deviation of the talus in relationship to the tibial plafond.  Nondisplaced posterior malleolus fracture also noted  CT ANKLE RIGHT WO CONTRAST 11/09/2023 IMPRESSION: 1. Oblique displaced fracture of the distal fibular diaphysis with the distal fracture margin extending to the proximal aspect of the distal tibiofibular syndesmosis. 2. Mildly displaced posterior malleolar fracture. 3. Minimally displaced avulsion fracture along the expected course of the deltoid ligamentous complex at the anteromedial ankle. 4. Avulsion fracture along the expected course of the anterior talofibular ligament at the anterolateral ankle.  Assessment/Plan of Care: 1.  Displaced trimalleolar ankle fracture with syndesmotic injury right.  DOI: 10/31/2023  -Patient evaluated.  Chart and imaging was reviewed today in length in detail with the patient -Today I had a very long detailed discussion with the patient and his cousin, Alisa Baptist, who was present upon evaluation.  We spent a great amount of time discussing the risks and associated benefits of ORIF of the right ankle.  The right ankle is incredibly unstable and prior to this incident he was ambulatory.  Despite the patient's health and risks with the chronic venous insufficiency and lymphedema I do believe that the benefits of ORIF of the right ankle outweigh the risks.  Chronically unstable ankle fracture will undoubtedly lead to  continued falls and instability which can obviously be detrimental for this patient.  Patient understands that the associated risks of surgery given his comorbidities include delayed healing, nonunion, infection, and ultimately possible limb loss. -Unfortunate the patient lives at home alone.   -I do believe the patient warrants admission into the hospital for ORIF of the right ankle and postoperative monitoring.  Upon discharge he will require medically necessary admission into long-term rehab facility to ensure postoperative nonweightbearing for minimum 8 weeks.  Patient has no means to care for himself especially living at home alone.  He will need 24-hour surveillance and care postoperatively.  Again this was discussed in length in detail  with patient. -Hoping to avoid admission through the ED will contact the hospital for possible direct admission and inpatient surgery. Will contact and discussed with hospitalist service for admission -Will monitor and manage inpatient  *Please contact Alisa Baptist (cousin) for directions regarding direct admission and registration of the hospital. Phone [phone]       Thresa EMERSON Sar, DPM [facility]  Dr. Thresa EMERSON Sar, DPM    [address] [place], [place] [address]                Office [phone]  Fax [phone]

## 2023-11-25 ENCOUNTER — Encounter
Admission: AD | Disposition: A | Discharge status: Skilled Nursing Facility | Payer: Self-pay | Source: Ambulatory Visit | Attending: Obstetrics and Gynecology

## 2023-11-25 ENCOUNTER — Inpatient Hospital Stay

## 2023-11-25 ENCOUNTER — Inpatient Hospital Stay: Payer: Self-pay | Admitting: Anesthesiology

## 2023-11-25 ENCOUNTER — Inpatient Hospital Stay: Admission: RE | Admit: 2023-11-25 | Source: Home / Self Care | Admitting: Podiatry

## 2023-11-25 ENCOUNTER — Other Ambulatory Visit: Payer: Self-pay

## 2023-11-25 DIAGNOSIS — S82841B Displaced bimalleolar fracture of right lower leg, initial encounter for open fracture type I or II: Secondary | ICD-10-CM | POA: Diagnosis not present

## 2023-11-25 HISTORY — PX: ORIF ANKLE FRACTURE: SHX5408

## 2023-11-25 LAB — GLUCOSE, CAPILLARY
Glucose-Capillary: 173 mg/dL — ABNORMAL HIGH (ref 70–99)
Glucose-Capillary: 203 mg/dL — ABNORMAL HIGH (ref 70–99)
Glucose-Capillary: 269 mg/dL — ABNORMAL HIGH (ref 70–99)
Glucose-Capillary: 244 mg/dL — ABNORMAL HIGH (ref 70–99)

## 2023-11-25 LAB — HEMOGLOBIN A1C
Hgb A1c MFr Bld: 7 % — ABNORMAL HIGH (ref 4.8–5.6)
Mean Plasma Glucose: 154.2 mg/dL

## 2023-11-25 SURGERY — OPEN REDUCTION INTERNAL FIXATION (ORIF) ANKLE FRACTURE
Anesthesia: Choice | Site: Ankle | Laterality: Right

## 2023-11-25 MED ORDER — FENTANYL CITRATE (PF) 100 MCG/2ML IJ SOLN
INTRAMUSCULAR | Status: AC
  Filled 2023-11-25: qty 2

## 2023-11-25 MED ORDER — SENNOSIDES-DOCUSATE SODIUM 8.6-50 MG PO TABS
1.0000 | ORAL_TABLET | Freq: Every day | ORAL | Status: DC
  Filled 2023-11-25: qty 1

## 2023-11-25 MED ORDER — SUGAMMADEX SODIUM 200 MG/2ML IV SOLN
INTRAVENOUS | Status: DC | PRN
  Administered 2023-11-25: 300 mg via INTRAVENOUS

## 2023-11-25 MED ORDER — EPHEDRINE SULFATE-NACL 50-0.9 MG/10ML-% IV SOSY
PREFILLED_SYRINGE | INTRAVENOUS | Status: DC | PRN
  Administered 2023-11-25: 5 mg via INTRAVENOUS
  Administered 2023-11-25: 10 mg via INTRAVENOUS
  Administered 2023-11-25 (×4): 5 mg via INTRAVENOUS

## 2023-11-25 MED ORDER — DEXAMETHASONE SODIUM PHOSPHATE 10 MG/ML IJ SOLN
INTRAMUSCULAR | Status: DC | PRN
  Administered 2023-11-25: 4 mg via INTRAVENOUS

## 2023-11-25 MED ORDER — LIDOCAINE HCL (PF) 2 % IJ SOLN
INTRAMUSCULAR | Status: AC
  Filled 2023-11-25: qty 5

## 2023-11-25 MED ORDER — ROCURONIUM BROMIDE 100 MG/10ML IV SOLN
INTRAVENOUS | Status: DC | PRN
Start: 2023-11-25 — End: 2023-11-25
  Administered 2023-11-25: 20 mg via INTRAVENOUS
  Administered 2023-11-25: 60 mg via INTRAVENOUS

## 2023-11-25 MED ORDER — PROPOFOL 10 MG/ML IV BOLUS
INTRAVENOUS | Status: DC | PRN
Start: 2023-11-25 — End: 2023-11-25
  Administered 2023-11-25: 150 mg via INTRAVENOUS

## 2023-11-25 MED ORDER — OXYCODONE HCL 5 MG PO TABS
ORAL_TABLET | ORAL | Status: AC
  Filled 2023-11-25: qty 1

## 2023-11-25 MED ORDER — ACETAMINOPHEN 10 MG/ML IV SOLN
INTRAVENOUS | Status: AC
  Filled 2023-11-25: qty 100

## 2023-11-25 MED ORDER — SODIUM CHLORIDE 0.9 % IV SOLN
INTRAVENOUS | Status: DC | PRN
Start: 2023-11-25 — End: 2023-11-25

## 2023-11-25 MED ORDER — ROCURONIUM BROMIDE 10 MG/ML (PF) SYRINGE
PREFILLED_SYRINGE | INTRAVENOUS | Status: AC
Start: 2023-11-25 — End: 2023-11-25
  Filled 2023-11-25: qty 10

## 2023-11-25 MED ORDER — FENTANYL CITRATE (PF) 100 MCG/2ML IJ SOLN
INTRAMUSCULAR | Status: AC
Start: 2023-11-25 — End: 2023-11-25
  Filled 2023-11-25: qty 2

## 2023-11-25 MED ORDER — LIDOCAINE HCL (PF) 1 % IJ SOLN
INTRAMUSCULAR | Status: AC
  Filled 2023-11-25: qty 30

## 2023-11-25 MED ORDER — OXYCODONE HCL 5 MG PO TABS
5.0000 mg | ORAL_TABLET | Freq: Once | ORAL | Status: AC | PRN
  Administered 2023-11-25: 5 mg via ORAL

## 2023-11-25 MED ORDER — OXYCODONE HCL 5 MG PO TABS
5.0000 mg | ORAL_TABLET | ORAL | Status: DC | PRN
  Administered 2023-11-25 – 2023-11-29 (×6): 10 mg via ORAL
  Filled 2023-11-25 (×6): qty 2

## 2023-11-25 MED ORDER — ADULT MULTIVITAMIN W/MINERALS CH
1.0000 | ORAL_TABLET | Freq: Every day | ORAL | Status: DC
  Administered 2023-11-26 – 2023-11-29 (×4): 1 via ORAL
  Filled 2023-11-25 (×4): qty 1

## 2023-11-25 MED ORDER — OXYCODONE HCL 5 MG/5ML PO SOLN: 5.0000 mg | Freq: Once | ORAL | Status: AC | PRN

## 2023-11-25 MED ORDER — FENTANYL CITRATE (PF) 100 MCG/2ML IJ SOLN
25.0000 ug | INTRAMUSCULAR | Status: DC | PRN
  Administered 2023-11-25: 25 ug via INTRAVENOUS

## 2023-11-25 MED ORDER — PHENYLEPHRINE 80 MCG/ML (10ML) SYRINGE FOR IV PUSH (FOR BLOOD PRESSURE SUPPORT)
PREFILLED_SYRINGE | INTRAVENOUS | Status: DC | PRN
  Administered 2023-11-25 (×7): 160 ug via INTRAVENOUS

## 2023-11-25 MED ORDER — MIDAZOLAM HCL 2 MG/2ML IJ SOLN
INTRAMUSCULAR | Status: AC
  Filled 2023-11-25: qty 2

## 2023-11-25 MED ORDER — POLYETHYLENE GLYCOL 3350 17 G PO PACK
17.0000 g | PACK | Freq: Every day | ORAL | Status: DC
  Administered 2023-11-25 – 2023-11-27 (×3): 17 g via ORAL
  Filled 2023-11-25 (×3): qty 1

## 2023-11-25 MED ORDER — BUPIVACAINE HCL (PF) 0.5 % IJ SOLN
INTRAMUSCULAR | Status: AC
  Filled 2023-11-25: qty 30

## 2023-11-25 MED ORDER — PROPOFOL 10 MG/ML IV BOLUS
INTRAVENOUS | Status: AC
  Filled 2023-11-25: qty 20

## 2023-11-25 MED ORDER — CEFAZOLIN SODIUM-DEXTROSE 3-4 GM/150ML-% IV SOLN
3.0000 g | INTRAVENOUS | Status: AC
  Administered 2023-11-25: 3 g via INTRAVENOUS
  Filled 2023-11-25: qty 150

## 2023-11-25 MED ORDER — PHENYLEPHRINE HCL-NACL 20-0.9 MG/250ML-% IV SOLN
INTRAVENOUS | Status: DC | PRN
  Administered 2023-11-25: 25 ug/min via INTRAVENOUS

## 2023-11-25 MED ORDER — PHENYLEPHRINE HCL-NACL 20-0.9 MG/250ML-% IV SOLN
INTRAVENOUS | Status: AC
  Filled 2023-11-25: qty 250

## 2023-11-25 MED ORDER — ACETAMINOPHEN 10 MG/ML IV SOLN
INTRAVENOUS | Status: DC | PRN
  Administered 2023-11-25: 1000 mg via INTRAVENOUS

## 2023-11-25 MED ORDER — EPHEDRINE 5 MG/ML INJ
INTRAVENOUS | Status: AC
  Filled 2023-11-25: qty 5

## 2023-11-25 MED ORDER — FENTANYL CITRATE (PF) 100 MCG/2ML IJ SOLN
INTRAMUSCULAR | Status: DC | PRN
  Administered 2023-11-25: 100 ug via INTRAVENOUS
  Administered 2023-11-25 (×2): 25 ug via INTRAVENOUS

## 2023-11-25 MED ORDER — PROPOFOL 1000 MG/100ML IV EMUL
INTRAVENOUS | Status: AC
Start: 2023-11-25 — End: 2023-11-25
  Filled 2023-11-25: qty 100

## 2023-11-25 MED ORDER — HYDROMORPHONE HCL 1 MG/ML IJ SOLN
1.0000 mg | INTRAMUSCULAR | Status: DC | PRN
  Administered 2023-11-28 (×2): 1 mg via INTRAVENOUS
  Filled 2023-11-25 (×2): qty 1

## 2023-11-25 MED ORDER — LIDOCAINE HCL (CARDIAC) PF 100 MG/5ML IV SOSY
PREFILLED_SYRINGE | INTRAVENOUS | Status: DC | PRN
  Administered 2023-11-25: 100 mg via INTRAVENOUS

## 2023-11-25 MED ORDER — ENSURE MAX PROTEIN PO LIQD
11.0000 [oz_av] | Freq: Two times a day (BID) | ORAL | Status: DC
  Administered 2023-11-25 – 2023-11-29 (×8): 11 [oz_av] via ORAL

## 2023-11-25 MED ORDER — ONDANSETRON HCL 4 MG/2ML IJ SOLN
INTRAMUSCULAR | Status: AC
  Filled 2023-11-25: qty 2

## 2023-11-25 MED ORDER — LIDOCAINE HCL (PF) 1 % IJ SOLN
INTRAMUSCULAR | Status: DC | PRN
  Administered 2023-11-25: 10 mL

## 2023-11-25 MED ORDER — ONDANSETRON HCL 4 MG/2ML IJ SOLN
INTRAMUSCULAR | Status: DC | PRN
Start: 2023-11-25 — End: 2023-11-25
  Administered 2023-11-25: 4 mg via INTRAVENOUS

## 2023-11-25 SURGICAL SUPPLY — 54 items
ANCH SUT GRAPPLER R3FLEX 2H (Anchor) IMPLANT
BIT DRILL CANN LONG 4.6X220 (DRILL) IMPLANT
BIT DRILL CANNULTD 2.6 X 130MM (DRILL) IMPLANT
BNDG COHESIVE 4X5 TAN STRL LF (GAUZE/BANDAGES/DRESSINGS) ×1 IMPLANT
BNDG ELASTIC 4INX 5YD STR LF (GAUZE/BANDAGES/DRESSINGS) IMPLANT
BNDG ELASTIC 4X5.8 VLCR NS LF (GAUZE/BANDAGES/DRESSINGS) ×1 IMPLANT
BNDG ESMARCH 4X12 STRL LF (GAUZE/BANDAGES/DRESSINGS) ×1 IMPLANT
BNDG GAUZE DERMACEA FLUFF 4 (GAUZE/BANDAGES/DRESSINGS) ×1 IMPLANT
BOOT STEPPER DURA LG (SOFTGOODS) IMPLANT
CHLORAPREP W/TINT 26 (MISCELLANEOUS) ×1 IMPLANT
CUFF TOURN SGL QUICK 18X4 (TOURNIQUET CUFF) IMPLANT
CUFF TRNQT CYL 24X4X16.5-23 (TOURNIQUET CUFF) IMPLANT
DRAPE C-ARM 42X70 (DRAPES) IMPLANT
DRAPE C-ARMOR (DRAPES) IMPLANT
DRAPE FLUOR MINI C-ARM 54X84 (DRAPES) IMPLANT
ELECTRODE REM PT RTRN 9FT ADLT (ELECTROSURGICAL) ×1 IMPLANT
GAUZE SPONGE 4X4 12PLY STRL (GAUZE/BANDAGES/DRESSINGS) ×1 IMPLANT
GAUZE XEROFORM 1X8 LF (GAUZE/BANDAGES/DRESSINGS) ×1 IMPLANT
GAUZE XEROFORM 5X9 LF (GAUZE/BANDAGES/DRESSINGS) IMPLANT
GLOVE BIO SURGEON STRL SZ8 (GLOVE) ×1 IMPLANT
GLOVE BIOGEL PI IND STRL 8 (GLOVE) ×1 IMPLANT
GOWN STRL REUS W/ TWL LRG LVL3 (GOWN DISPOSABLE) ×1 IMPLANT
GOWN STRL REUS W/ TWL XL LVL3 (GOWN DISPOSABLE) ×1 IMPLANT
HANDLE YANKAUER SUCT BULB TIP (MISCELLANEOUS) ×1 IMPLANT
KIT PREVENA INCISION MGT 13 (CANNISTER) IMPLANT
KIT TURNOVER KIT A (KITS) ×1 IMPLANT
KWIRE SNGL END 1.2X150 (MISCELLANEOUS) IMPLANT
LABEL OR SOLS (LABEL) ×1 IMPLANT
MANIFOLD NEPTUNE II (INSTRUMENTS) ×1 IMPLANT
NDL HYPO 25X1 1.5 SAFETY (NEEDLE) ×2 IMPLANT
NEEDLE HYPO 25X1 1.5 SAFETY (NEEDLE) ×2 IMPLANT
NS IRRIG 500ML POUR BTL (IV SOLUTION) ×1 IMPLANT
PACK EXTREMITY ARMC (MISCELLANEOUS) ×1 IMPLANT
PAD ABD DERMACEA PRESS 5X9 (GAUZE/BANDAGES/DRESSINGS) ×1 IMPLANT
PAD PREP OB/GYN DISP 24X41 (PERSONAL CARE ITEMS) ×1 IMPLANT
PENCIL SMOKE EVACUATOR (MISCELLANEOUS) ×1 IMPLANT
PLATE FIB 11H ANATOMICAL RT (Plate) IMPLANT
SCREW CANN MH LNG 4.0X24 (Screw) IMPLANT
SCREW COUNTERSINK 4.0 HEADED (MISCELLANEOUS) IMPLANT
SCREW LOCK PLATE R3 3.5X12 (Screw) IMPLANT
SCREW LOCK PLATE R3 3.5X14 (Screw) IMPLANT
SCREW LOCK PLATE R3 3.5X16 (Screw) IMPLANT
SOLUTION PREP PVP 2OZ (MISCELLANEOUS) ×1 IMPLANT
SPONGE T-LAP 18X18 ~~LOC~~+RFID (SPONGE) ×1 IMPLANT
STAPLER SKIN PROX 35W (STAPLE) IMPLANT
STIMULATOR BONE GROWTH EMG EXT (ORTHOPEDIC SUPPLIES) IMPLANT
STOCKINETTE M/LG 89821 (MISCELLANEOUS) ×1 IMPLANT
STRAP SAFETY 5IN WIDE (MISCELLANEOUS) ×1 IMPLANT
SUT VIC AB 4-0 PS2 27 (SUTURE) ×1 IMPLANT
SUT VICRYL AB 3-0 FS1 BRD 27IN (SUTURE) ×1 IMPLANT
SUTURE VICRYL 4-0 27 PS-2 BART (SUTURE) ×1 IMPLANT
SYR 10ML LL (SYRINGE) ×2 IMPLANT
TRAP FLUID SMOKE EVACUATOR (MISCELLANEOUS) ×1 IMPLANT
WATER STERILE IRR 500ML POUR (IV SOLUTION) ×1 IMPLANT

## 2023-11-25 NOTE — Brief Op Note (Signed)
 11/24/2023 - 11/25/2023  11:19 AM  PATIENT:  [name]  73 y.o. male  PRE-OPERATIVE DIAGNOSIS:  Right Ankle Fracture  POST-OPERATIVE DIAGNOSIS:  Right Ankle Fracture  PROCEDURE:  Procedure(s): OPEN REDUCTION INTERNAL FIXATION (ORIF) ANKLE FRACTURE (Right)  SURGEON:  Surgeons and Role:    DEWAINE Janit Thresa CHRISTELLA, DPM - Primary  PHYSICIAN ASSISTANT:   ASSISTANTS: none   ANESTHESIA:   general  EBL:  10mL   BLOOD ADMINISTERED:none  DRAINS: none   LOCAL MEDICATIONS USED:  LIDOCAINE   and Amount: 20 ml  SPECIMEN:  No Specimen  DISPOSITION OF SPECIMEN:  N/A  COUNTS:  YES  TOURNIQUET:   Total Tourniquet Time Documented: Thigh (Right) - 95 minutes Total: Thigh (Right) - 95 minutes   DICTATION: .Nechama Dictation  PLAN OF CARE: Admit to inpatient   PATIENT DISPOSITION:  PACU - hemodynamically stable.   Delay start of Pharmacological VTE agent (>24hrs) due to surgical blood loss or risk of bleeding: no

## 2023-11-25 NOTE — Op Note (Signed)
 OPERATIVE REPORT Patient name: [name] MRN: [id number] DOB: July 19, 1950  DOS: 11/25/23  Preop Dx: Bimalleolar ankle fracture right.  Syndesmotic injury right Postop Dx: same  Procedure:  1.  ORIF trimalleolar ankle fracture right 2.  Repair tibiofibular syndesmosis injury right  Surgeon: Thresa EMERSON Sar DPM  Anesthesia: General anesthesia  Hemostasis: Thigh tourniquet inflated to a pressure of after esmarch exsanguination   EBL: 10 mL Materials: Paragon 28 Fibular plate w/ 6.4ff locking screws.  Paragon 28 4.0 mm interfrag screw.  Paragon 28 R55flex stabilization system. Provena negative pressure incisional VAC.  Orthofix external bone stimulator  Injectables: 20 mL of 1% lidocaine  plain Pathology: None  Condition: The patient tolerated the procedure and anesthesia well. No complications noted or reported   Justification for procedure: The patient is a 73 y.o. male who presents today for surgical correction of displaced trimalleolar ankle fracture right. The patient was told benefits as well as possible side effects of the surgery. The patient consented for surgical correction. The patient consent form was reviewed. All patient questions were answered. No guarantees were expressed or implied. The patient and the surgeon both signed the patient consent form with the witness present and placed in the patient's chart.   Procedure in Detail: The patient was brought to the operating room, placed in the operating table in the supine position at which time an aseptic scrub and drape were performed about the patient's respective lower extremity after anesthesia was induced as described above. Attention was then directed to the surgical area where procedure number one commenced.  Procedure #1: ORIF trimalleolar ankle fracture right Attention was directed to the lateral aspect of the right ankle where a 13 cm linear longitudinal skin incision was planned and made bisecting the  distal 1/4 of the fibula.  Incision was carried down to the level of bone and fracture site with care taken to cut clamp ligate retract away all small neurovascular structures traversing the incision site.  The fracture site was identified and soft callus around the area that had developed over the past 3.5 weeks was resected away using a rongeur.  Mobilization of the fracture site was achieved and the fracture fragment was reduced using bone reduction forceps which was verified by x-ray fluoroscopy.  After appropriate reduction of the fracture site a 4.0 mm interfrag screw was placed across the fracture followed by an 11-hole fibular plate with respective screws.  All orthopedic hardware was inserted in the standard AO fashion and verified by intraoperative x-ray fluoroscopy.  Temporary reduction forceps were removed and attention was directed to the syndesmotic injury where procedure #2 commenced.  Procedure #2: Repair syndesmotic injury right tibiofibular syndesmosis X-ray fluoroscopy and preoperative evaluation indicated syndesmotic injury and diastases between the tibia and fibula. Paragon28 R1flex stabilization system was utilized to repair and reduce the diastases and stabilize the syndesmosis.  It was inserted just distal to the fracture site through the fibular plate and inserted in the standard fashion as indicated on the surgical technique guide.  Placement was verified with intraoperative x-ray fluoroscopy and visualization and reduction of the syndesmosis was visualized as well on x-ray.  Good stabilization of the fracture fragment and ankle joint noted after placement of the stabilization system.  Copious irrigation was then utilized in preparation for routine layered primary closure.  Primary closure was achieved using a combination of 3-0 Vicryl, 4-0 Vicryl, and skin staples followed by Provena incisional VAC placed directly over the incision site of the fibula.  Dry sterile compressive  dressings were then applied to all previously mentioned incision sites about the patient's lower extremity. The tourniquet which was used for hemostasis was deflated. All normal neurovascular responses including pink color and warmth returned all the digits of patient's lower extremity.  Immobilization cam boot was also applied to the lower extremity with external Orthofix bone stimulator to facilitate healing of the fracture site and osseous union postoperatively.  The patient was then transferred from the operating room to the recovery room having tolerated the procedure and anesthesia well. All vital signs are stable. After a brief stay in the recovery room the patient was readmitted to inpatient room with postoperative orders placed.    IMPRESSION: Good reduction and restoration of the ankle mortise.   Thresa EMERSON Sar, DPM [facility]  Dr. Thresa EMERSON Sar, DPM    [address] [place], [place] [address]                Office [phone]  Fax [phone]

## 2023-11-25 NOTE — Progress Notes (Signed)
 Initial Nutrition Assessment  DOCUMENTATION CODES:   Obesity unspecified  INTERVENTION:   Ensure Max protein supplement po BID, each supplement provides 150kcal and 30g of protein.  MVI po daily   NUTRITION DIAGNOSIS:   Increased nutrient needs related to post-op healing as evidenced by estimated needs.  GOAL:   Patient will meet greater than or equal to 90% of their needs  MONITOR:   PO intake, Supplement acceptance, Labs, Weight trends, I & O's, Skin  REASON FOR ASSESSMENT:   Consult Assessment of nutrition requirement/status  ASSESSMENT:   73 y/o male with DM, HTN, PVD, CHF, CKD III, HLD, lymphedema, chronic VSUs and umbilcal hernia who is admitted with ankle fractur after a fall s/p ORIF 7/19.  RD working remotely.  Pt with increased estimated needs r/t ankle fracture. RD will add supplements and vitamins to help support post op healing. Per chart, pt appears weight stable at baseline. RD will obtain exam and history at follow up.   Medications reviewed and include: D3, lovenox , insulin , senokot  Labs reviewed: K 3.4(L), creat 1.30(H) Cbgs- 269, 203 x 24 hrs  AIC 7.0(H)- 7/18  NUTRITION - FOCUSED PHYSICAL EXAM: Unable to perform at this time   Diet Order:   Diet Order             Diet Carb Modified Fluid consistency: Thin; Room service appropriate? Yes  Diet effective now                  EDUCATION NEEDS:   Not appropriate for education at this time  Skin:  Skin Assessment: Reviewed RN Assessment (incision ankle)  Last BM:  pta  Height:   Ht Readings from Last 1 Encounters:  11/24/23 6' 1 (1.854 m)    Weight:   Wt Readings from Last 1 Encounters:  11/24/23 (!) 142.9 kg    Ideal Body Weight:  83.6 kg  BMI:  There is no height or weight on file to calculate BMI.  Estimated Nutritional Needs:   Kcal:  2500-2800kcal/day  Protein:  >125g/day  Fluid:  2.2-2.5L/day  Augustin Shams MS, RD, LDN If unable to be reached, please send  secure chat to RD inpatient available from 8:00a-4:00p daily

## 2023-11-25 NOTE — Progress Notes (Addendum)
 PROGRESS NOTE    [name]  FMW:[id number] DOB: 1950-07-15 DOA: 11/24/2023 PCP: Dexter Harlene BIRCH, PA-C  Outpatient Specialists: [facility]    Brief Narrative:   From admission h and p [name] is a 73 y.o. male with medical history significant of morbid obesity, hypertension, type 2 diabetes mellitus, lymphedema, grade 2 diastolic dysfunction, CKD stage IIIa who was referred to the emergency room by [facility] for elective trimalleolar fracture repair.  Patient apparently had an injury about 3 weeks ago after he fell at home.  He was previously seen and evaluated by [facility] and was recommended conservative management with boot.  Patient continues to have significant pain with ambulation and decided to sought second opinion with [facility] today.   CT scan done of the ankle joint revealed oblique displaced trimalleolar fracture.  Patient was referred to the hospital for admission with plans for ORIF scheduled for tomorrow AM.   Patient denies any history of coronary disease.  At baseline, he was able to ambulate without any exertional dyspnea prior to the injury.  No recent chest pain. EKG has been ordered and pending.   Assessment & Plan:   Principal Problem:   Ankle fracture, bimalleolar, closed, right, sequela Active Problems:   Type II diabetes mellitus with renal manifestations (HCC)   Essential hypertension   Morbid obesity (HCC)   Peripheral vascular disease (HCC)   Chronic diastolic CHF (congestive heart failure) (HCC)   CKD (chronic kidney disease), stage IIIa  # Right bimaleolar ankle fracture with syndesmotic injury Pod0 from operative repair with podiatry - care per podiatry - pt/ot consults - pain control, bowel regimen  # Med rec Pending  # CKD 3a No check of kidney function has yet been performed - f/u bmp  # T2DM Glucose appropriate - SSI for now - holding home glimepiride , pioglitazone   # Morbid obesity -  noted  # HFpEF Appears compensated - home bisoprolol , furosemide  on hold  # HTN BPs soft after surgery - hold home bisoprolol , hydral, lisinopril  - hold antihypertensives for now  # MDD - home sertraline   # Debility Lives alone, ambulates w/ a walker at baseline. Will likely need snf - pt/ot consults pending    DVT prophylaxis: lovenox  Code Status: dnr/dni, patient confirms this on 7/19 Family Communication: cousin Alisa updated telephonically 7/19  Level of care: Med-Surg Status is: Inpatient Remains inpatient appropriate because: severity of illness    Consultants:  podiatry  Procedures: See above  Antimicrobials:  Peri-operative    Subjective: Reports right ankle pain, no other complaints  Objective: Vitals:   11/25/23 1223 11/25/23 1224 11/25/23 1250 11/25/23 1256  BP:  (!) 113/53 (!) 81/32 (!) 103/42  Pulse: (!) 57 61 66   Resp: 14 14 14    Temp:  (!) 97.5 F (36.4 C) 97.9 F (36.6 C)   TempSrc:   Oral   SpO2: 99% 100% 96%     Intake/Output Summary (Last 24 hours) at 11/25/2023 1448 Last data filed at 11/25/2023 1118 Gross per 24 hour  Intake 750 ml  Output --  Net 750 ml   There were no vitals filed for this visit.  Examination:  General exam: Appears calm and comfortable  Respiratory system: Clear to auscultation. Respiratory effort normal. Cardiovascular system: S1 & S2 heard, RRR.   Gastrointestinal system: Abdomen is obese, soft and nontender.   Central nervous system: Alert and oriented. No focal neurological deficits. Extremities:  right foot in boot  Skin: No visible rashes, lesions or ulcers Psychiatry: Judgement and insight appear normal. Mood & affect appropriate.     Data Reviewed: I have personally reviewed following labs and imaging studies  CBC: No results for input(s): WBC, NEUTROABS, HGB, HCT, MCV, PLT in the last 168 hours. Basic Metabolic Panel: No results for input(s): NA, K, CL, CO2, GLUCOSE,  BUN, CREATININE, CALCIUM, MG, PHOS in the last 168 hours. GFR: CrCl cannot be calculated (Patient's most recent lab result is older than the maximum 21 days allowed.). Liver Function Tests: No results for input(s): AST, ALT, ALKPHOS, BILITOT, PROT, ALBUMIN in the last 168 hours. No results for input(s): LIPASE, AMYLASE in the last 168 hours. No results for input(s): AMMONIA in the last 168 hours. Coagulation Profile: No results for input(s): INR, PROTIME in the last 168 hours. Cardiac Enzymes: No results for input(s): CKTOTAL, CKMB, CKMBINDEX, TROPONINI in the last 168 hours. BNP (last 3 results) No results for input(s): PROBNP in the last 8760 hours. HbA1C: Recent Labs    11/24/23 2156  HGBA1C 7.0*   CBG: Recent Labs  Lab 11/24/23 2141 11/25/23 1127 11/25/23 1250  GLUCAP 174* 173* 203*   Lipid Profile: No results for input(s): CHOL, HDL, LDLCALC, TRIG, CHOLHDL, LDLDIRECT in the last 72 hours. Thyroid Function Tests: No results for input(s): TSH, T4TOTAL, FREET4, T3FREE, THYROIDAB in the last 72 hours. Anemia Panel: No results for input(s): VITAMINB12, FOLATE, FERRITIN, TIBC, IRON, RETICCTPCT in the last 72 hours. Urine analysis: No results found for: COLORURINE, APPEARANCEUR, LABSPEC, PHURINE, GLUCOSEU, HGBUR, BILIRUBINUR, KETONESUR, PROTEINUR, UROBILINOGEN, NITRITE, LEUKOCYTESUR Sepsis Labs: @LABRCNTIP (procalcitonin:4,lacticidven:4)  )No results found for this or any previous visit (from the past 240 hours).       Radiology Studies: DG Ankle Complete Right Result Date: 11/25/2023 CLINICAL DATA:  Postoperative evaluation, status post ORIF EXAM: RIGHT ANKLE - COMPLETE 3+ VIEW COMPARISON:  November 24, 2023 ankle radiograph FINDINGS: Sequelae of interval reduction of distal tibia and fibular fractures with plate and screw fixation hardware traversing the distal fibular  fracture. There is now near anatomic alignment of the tibiotalar joint. No new fractures are identified. Surgical staples along the lateral aspect of the ankle. Diffuse soft tissue swelling. IMPRESSION: Sequelae of interval ORIF of distal tibia and fibular fractures with improved alignment and no acute adverse features. Electronically Signed   By: Michaeline Blanch M.D.   On: 11/25/2023 13:54   DG MINI C-ARM IMAGE ONLY Result Date: 11/25/2023 There is no interpretation for this exam.  This order is for images obtained during a surgical procedure.  Please See Surgeries Tab for more information regarding the procedure.   Chest Portable 1 View Result Date: 11/24/2023 CLINICAL DATA:  [id number] Volume overload [id number] EXAM: PORTABLE CHEST 1 VIEW COMPARISON:  None Available. FINDINGS: The heart and mediastinal contours are within normal limits. Low lung volumes. Left base atelectasis. No pulmonary edema. No pleural effusion. No pneumothorax. No acute osseous abnormality. IMPRESSION: Low lung volumes with left base atelectasis. Recommend repeat chest x-ray PA and lateral view for further evaluation. Electronically Signed   By: Morgane  Naveau M.D.   On: 11/24/2023 22:10   DG Ankle Complete Right Result Date: 11/24/2023 Please see detailed radiograph report in office note.       Scheduled Meds:  aspirin   81 mg Oral Daily   cholecalciferol   2,000 Units Oral Daily   enoxaparin  (LOVENOX ) injection  0.5 mg/kg Subcutaneous Q24H   insulin  aspart  0-9 Units Subcutaneous TID WC   pravastatin   40  mg Oral Daily   sertraline   200 mg Oral Daily   Continuous Infusions:  sodium chloride        LOS: 1 day     Devaughn KATHEE Ban, MD [facility]   If 7PM-7AM, please contact night-coverage [email or web address] Password [email or web address] 11/25/2023, 2:48 PM

## 2023-11-25 NOTE — Consult Note (Signed)
 PODIATRY CONSULTATION  NAME Mahin Guardia MRN [id number] DOB 1951/03/05 DOA 11/24/2023   Reason for consult: No chief complaint on file.   Consulting physician:   History of present illness: 73 y.o. male   Past Medical History:  Diagnosis Date   Diabetes mellitus without complication (HCC)    Hyperlipidemia    Hypertension    Leukemia (HCC)        Latest Ref Rng & Units 01/30/2023   10:31 AM 04/26/2021   11:32 AM 12/04/2020    4:52 AM  CBC  WBC 4.0 - 10.5 K/uL 5.3  6.1  7.0   Hemoglobin 13.0 - 17.0 g/dL 88.7  8.8  8.7   Hematocrit 39.0 - 52.0 % 35.2  29.8  28.6   Platelets 150 - 400 K/uL 180  204  193        Latest Ref Rng & Units 01/30/2023   10:31 AM 06/02/2021    3:59 PM 05/04/2021   10:03 AM  BMP  Glucose 70 - 99 mg/dL 806  93  804   BUN 8 - 23 mg/dL 23  34  38   Creatinine 0.61 - 1.24 mg/dL 8.69  8.64  8.40   BUN/Creat Ratio 10 - 24  25  24    Sodium 135 - 145 mmol/L 140  145  144   Potassium 3.5 - 5.1 mmol/L 3.4  4.7  4.3   Chloride 98 - 111 mmol/L 99  102  101   CO2 22 - 32 mmol/L 29  20  27    Calcium 8.9 - 10.3 mg/dL 9.3  9.4  9.3       Physical Exam: General: The patient is alert and oriented x3 in no acute distress.   Dermatology: Skin is warm, dry and supple bilateral lower extremities. Negative for open lesions or macerations.  Vascular: Palpable pedal pulses bilaterally. No edema or erythema noted. Capillary refill within normal limits.  Neurological: Epicritic and protective threshold grossly intact bilaterally.   Musculoskeletal Exam: No structural deformity noted.    ASSESSMENT/PLAN OF CARE 1.  Unstable trimalleolar ankle fracture with syndesmotic injury right  -Patient seen preoperatively.  Again today reviewed the necessity for ORIF of the right ankle due to the severe instability.  Patient understands the risks and benefits associated which were reviewed again today.  He understands that he is high chance for complication which we will  manage to the best of our ability if they arise.  Complications including wound dehiscence, infection, nonunion, and possibly limb loss.  He also understands that without ORIF there arises additional complications including chronic instability of the ankle joint which would create high susceptibility to fall risk, and patient would likely never be able to walk on the foot again.  Prior to the ankle fracture he was ambulating and independent. -Surgical consent reviewed again this morning with both the patient and his sister which is POA, Diane Setzer, as well as his cousin who was present in the office yesterday, Alisa Baptist.  All are in agreement to proceed with surgery at this time -Admitted yesterday, 11/24/2023, direct admission to [facility] hospital.  -Plan for discharge to long-term care facility.  Will require case management consult and placement.  Patient lives alone and this will be medically necessary since he will be strictly nonweightbearing postoperatively 8 weeks. -Will follow while inpatient     Thank you for the consult.  Please contact me directly via secure chat with any questions or concerns.  Thresa EMERSON Sar, DPM [facility]  Dr. Thresa EMERSON Sar, DPM    [address] [place], [place] [address]                Office [phone]  Fax [phone]

## 2023-11-25 NOTE — Anesthesia Procedure Notes (Signed)
 Procedure Name: Intubation Date/Time: 11/25/2023 9:28 AM  Performed by: Dyane Mass, CRNAPre-anesthesia Checklist: Patient identified, Emergency Drugs available, Suction available and Patient being monitored Patient Re-evaluated:Patient Re-evaluated prior to induction Oxygen Delivery Method: Circle system utilized Preoxygenation: Pre-oxygenation with 100% oxygen Induction Type: IV induction Ventilation: Mask ventilation without difficulty Laryngoscope Size: McGrath and 4 Grade View: Grade I Tube type: Oral Tube size: 7.5 mm Number of attempts: 1 Airway Equipment and Method: Stylet and Oral airway Placement Confirmation: ETT inserted through vocal cords under direct vision, positive ETCO2 and breath sounds checked- equal and bilateral Secured at: 23 cm Tube secured with: Tape Dental Injury: Teeth and Oropharynx as per pre-operative assessment

## 2023-11-25 NOTE — Anesthesia Postprocedure Evaluation (Signed)
 Anesthesia Post Note  Patient: [name]  Procedure(s) Performed: OPEN REDUCTION INTERNAL FIXATION (ORIF) ANKLE FRACTURE (Right: Ankle)  Patient location during evaluation: PACU Anesthesia Type: General Level of consciousness: awake and alert Pain management: pain level controlled Vital Signs Assessment: post-procedure vital signs reviewed and stable Respiratory status: spontaneous breathing, nonlabored ventilation, respiratory function stable and patient connected to nasal cannula oxygen Cardiovascular status: blood pressure returned to baseline and stable Postop Assessment: no apparent nausea or vomiting Anesthetic complications: no   No notable events documented.   Last Vitals:  Vitals:   11/25/23 1250 11/25/23 1256  BP: (!) 81/32 (!) 103/42  Pulse: 66   Resp: 14   Temp: 36.6 C   SpO2: 96%     Last Pain:  Vitals:   11/25/23 1250  TempSrc: Oral  PainSc:                  [name]

## 2023-11-25 NOTE — Transfer of Care (Signed)
 Immediate Anesthesia Transfer of Care Note  Patient: [name]  Procedure(s) Performed: OPEN REDUCTION INTERNAL FIXATION (ORIF) ANKLE FRACTURE (Right: Ankle)  Patient Location: PACU  Anesthesia Type:General  Level of Consciousness: drowsy and patient cooperative  Airway & Oxygen Therapy: Patient Spontanous Breathing and Patient connected to face mask oxygen  Post-op Assessment: Report given to RN and Post -op Vital signs reviewed and stable  Post vital signs: Reviewed and stable  Last Vitals:  Vitals Value Taken Time  BP 112/60 11/25/23 11:25  Temp    Pulse 56 11/25/23 11:28  Resp    SpO2 100 % 11/25/23 11:28  Vitals shown include unfiled device data.  Last Pain:  Vitals:   11/25/23 0431  TempSrc: Oral  PainSc:          Complications: No notable events documented.

## 2023-11-25 NOTE — Plan of Care (Signed)

## 2023-11-25 NOTE — Interval H&P Note (Signed)
 History and Physical Interval Note:  11/25/2023 6:18 AM  [name]  has presented today for surgery, with the diagnosis of Right Ankle Fracture.  The various methods of treatment have been discussed with the patient and family. After consideration of risks, benefits and other options for treatment, the patient has consented to  Procedure(s): OPEN REDUCTION INTERNAL FIXATION (ORIF) ANKLE FRACTURE (Right) as a surgical intervention.  The patient's history has been reviewed, patient examined, no change in status, stable for surgery.  I have reviewed the patient's chart and labs.  Questions were answered to the patient's satisfaction.     Thresa CHRISTELLA Sar

## 2023-11-25 NOTE — Anesthesia Preprocedure Evaluation (Signed)
 Anesthesia Evaluation  Patient identified by MRN, date of birth, ID band Patient awake    Reviewed: Allergy & Precautions, NPO status , Patient's Chart, lab work & pertinent test results  History of Anesthesia Complications Negative for: history of anesthetic complications  Airway Mallampati: IV  TM Distance: >3 FB Neck ROM: full    Dental  (+) Poor Dentition   Pulmonary neg shortness of breath, sleep apnea , neg COPD, neg recent URI, former smoker   Pulmonary exam normal        Cardiovascular hypertension, (-) angina + CAD, + Peripheral Vascular Disease and +CHF  (-) Past MI and (-) CABG Normal cardiovascular exam     Neuro/Psych negative neurological ROS  negative psych ROS   GI/Hepatic negative GI ROS, Neg liver ROS,,,  Endo/Other  diabetes  Class 3 obesity  Renal/GU Renal disease     Musculoskeletal   Abdominal   Peds  Hematology  (+) Blood dyscrasia, anemia   Anesthesia Other Findings Past Medical History: No date: Diabetes mellitus without complication (HCC) No date: Hyperlipidemia No date: Hypertension No date: Leukemia (HCC)  Past Surgical History: No date: CATARACT EXTRACTION; Right No date: TONSILLECTOMY No date: TOOTH EXTRACTION  BMI    Body Mass Index: 41.56 kg/m      Reproductive/Obstetrics negative OB ROS                              Anesthesia Physical Anesthesia Plan  ASA: 3  Anesthesia Plan:    Post-op Pain Management:    Induction: Intravenous  PONV Risk Score and Plan: 2 and Ondansetron  and Dexamethasone   Airway Management Planned: LMA  Additional Equipment:   Intra-op Plan:   Post-operative Plan: Extubation in OR  Informed Consent: I have reviewed the patients History and Physical, chart, labs and discussed the procedure including the risks, benefits and alternatives for the proposed anesthesia with the patient or authorized representative  who has indicated his/her understanding and acceptance.     Dental Advisory Given  Plan Discussed with: Anesthesiologist, CRNA and Surgeon  Anesthesia Plan Comments: (Patient consented for risks of anesthesia including but not limited to:  - adverse reactions to medications - damage to eyes, teeth, lips or other oral mucosa - nerve damage due to positioning  - sore throat or hoarseness - Damage to heart, brain, nerves, lungs, other parts of body or loss of life  Patient voiced understanding and assent.)        Anesthesia Quick Evaluation

## 2023-11-26 ENCOUNTER — Inpatient Hospital Stay

## 2023-11-26 LAB — CBC
HCT: 34.4 % — ABNORMAL LOW (ref 39.0–52.0)
Hemoglobin: 10.5 g/dL — ABNORMAL LOW (ref 13.0–17.0)
MCH: 27.6 pg (ref 26.0–34.0)
MCHC: 30.5 g/dL (ref 30.0–36.0)
MCV: 90.3 fL (ref 80.0–100.0)
Platelets: 155 K/uL (ref 150–400)
RBC: 3.81 MIL/uL — ABNORMAL LOW (ref 4.22–5.81)
RDW: 16.9 % — ABNORMAL HIGH (ref 11.5–15.5)
WBC: 7.3 K/uL (ref 4.0–10.5)
nRBC: 0 % (ref 0.0–0.2)

## 2023-11-26 LAB — BASIC METABOLIC PANEL WITH GFR
Anion gap: 7 (ref 5–15)
BUN: 34 mg/dL — ABNORMAL HIGH (ref 8–23)
CO2: 31 mmol/L (ref 22–32)
Calcium: 9 mg/dL (ref 8.9–10.3)
Chloride: 102 mmol/L (ref 98–111)
Creatinine, Ser: 1.21 mg/dL (ref 0.61–1.24)
GFR, Estimated: >60 mL/min (ref >60)
Glucose, Bld: 170 mg/dL — ABNORMAL HIGH (ref 70–99)
Potassium: 3.9 mmol/L (ref 3.5–5.1)
Sodium: 140 mmol/L (ref 135–145)

## 2023-11-26 LAB — D-DIMER, QUANTITATIVE: D-Dimer, Quant: 1.77 ug{FEU}/mL — ABNORMAL HIGH (ref 0.00–0.50)

## 2023-11-26 LAB — GLUCOSE, CAPILLARY
Glucose-Capillary: 167 mg/dL — ABNORMAL HIGH (ref 70–99)
Glucose-Capillary: 174 mg/dL — ABNORMAL HIGH (ref 70–99)
Glucose-Capillary: 158 mg/dL — ABNORMAL HIGH (ref 70–99)
Glucose-Capillary: 218 mg/dL — ABNORMAL HIGH (ref 70–99)

## 2023-11-26 LAB — BRAIN NATRIURETIC PEPTIDE: B Natriuretic Peptide: 187.2 pg/mL — ABNORMAL HIGH (ref 0.0–100.0)

## 2023-11-26 MED ORDER — FUROSEMIDE 40 MG PO TABS
40.0000 mg | ORAL_TABLET | Freq: Every day | ORAL | Status: DC
  Administered 2023-11-26 – 2023-11-29 (×4): 40 mg via ORAL
  Filled 2023-11-26 (×4): qty 1

## 2023-11-26 MED ORDER — IOHEXOL 350 MG/ML SOLN
100.0000 mL | Freq: Once | INTRAVENOUS | Status: AC | PRN
  Administered 2023-11-26: 100 mL via INTRAVENOUS

## 2023-11-26 MED ORDER — IOHEXOL 350 MG/ML SOLN
75.0000 mL | Freq: Once | INTRAVENOUS | Status: AC | PRN
  Administered 2023-11-26: 75 mL via INTRAVENOUS

## 2023-11-26 NOTE — Progress Notes (Signed)
 PROGRESS NOTE    [name]  FMW:[id number] DOB: 1951/02/02 DOA: 11/24/2023 PCP: Dexter Harlene BIRCH, PA-C  Outpatient Specialists: [facility]    Brief Narrative:   From admission h and p [name] is a 73 y.o. male with medical history significant of morbid obesity, hypertension, type 2 diabetes mellitus, lymphedema, grade 2 diastolic dysfunction, CKD stage IIIa who was referred to the emergency room by [facility] for elective trimalleolar fracture repair.  Patient apparently had an injury about 3 weeks ago after he fell at home.  He was previously seen and evaluated by [facility] and was recommended conservative management with boot.  Patient continues to have significant pain with ambulation and decided to sought second opinion with [facility] today.   CT scan done of the ankle joint revealed oblique displaced trimalleolar fracture.  Patient was referred to the hospital for admission with plans for ORIF scheduled for tomorrow AM.   Patient denies any history of coronary disease.  At baseline, he was able to ambulate without any exertional dyspnea prior to the injury.  No recent chest pain. EKG has been ordered and pending.   Assessment & Plan:   Principal Problem:   Ankle fracture, bimalleolar, closed, right, sequela Active Problems:   Type II diabetes mellitus with renal manifestations (HCC)   Essential hypertension   Morbid obesity (HCC)   Peripheral vascular disease (HCC)   Chronic diastolic CHF (congestive heart failure) (HCC)   CKD (chronic kidney disease), stage IIIa  # Right bimaleolar ankle fracture with syndesmotic injury Pod1 from operative repair with podiatry - care per podiatry - pt/ot consults pending - pain control, bowel regimen. No BM yet  # Acute hypoxic respiratory failure Sats in upper 80s off oxygen. Denies dyspnea or cough. Suspect atelectasis and x-ray on 7/18 suggests this.  - continue IC - continue O2 - check  bnp and dimer  # CKD 2/3a Kidney function at baseline  # T2DM Glucose appropriate - SSI for now - holding home glimepiride , pioglitazone   # Morbid obesity - noted  # HFpEF Appears compensated - resume home furosemide  - home bisoprolol  on hold  # HTN BPs soft after surgery, improving - hold home bisoprolol , hydral, lisinopril   # MDD - home sertraline   # Debility Lives alone, ambulates w/ a walker at baseline. Will likely need snf - pt/ot consults pending    DVT prophylaxis: lovenox  Code Status: dnr/dni, patient confirms this on 7/19 Family Communication: cousin Alisa updated telephonically 7/19. No answer when sister diane telephoned today  Level of care: Med-Surg Status is: Inpatient Remains inpatient appropriate because: severity of illness    Consultants:  podiatry  Procedures: See above  Antimicrobials:  Peri-operative    Subjective: Reports stable right ankle pain, no other complaints  Objective: Vitals:   11/25/23 1527 11/25/23 1945 11/26/23 0402 11/26/23 0831  BP: (!) 96/44 (!) 109/51 (!) 124/55 126/62  Pulse: 66 65 71 83  Resp: 17 20 16 18   Temp: 98.1 F (36.7 C) 98 F (36.7 C) 98.5 F (36.9 C) 98.3 F (36.8 C)  TempSrc: Oral     SpO2: 98% 100% 96% 97%  Weight:   (!) (P) 153.5 kg     Intake/Output Summary (Last 24 hours) at 11/26/2023 1034 Last data filed at 11/26/2023 0900 Gross per 24 hour  Intake 1830 ml  Output --  Net 1830 ml   Filed Weights   11/26/23 0402  Weight: (!) (P) 153.5 kg  Examination:  General exam: Appears calm and comfortable  Respiratory system: Clear to auscultation save for basilar rales. Respiratory effort normal. Cardiovascular system: S1 & S2 heard, RRR.   Gastrointestinal system: Abdomen is obese, soft and nontender.   Central nervous system: Alert and oriented. No focal neurological deficits. Extremities:  right foot in boot Skin: No visible rashes, lesions or ulcers Psychiatry: Judgement and  insight appear normal. Mood & affect appropriate.     Data Reviewed: I have personally reviewed following labs and imaging studies  CBC: Recent Labs  Lab 11/26/23 0513  WBC 7.3  HGB 10.5*  HCT 34.4*  MCV 90.3  PLT 155   Basic Metabolic Panel: Recent Labs  Lab 11/26/23 0513  NA 140  K 3.9  CL 102  CO2 31  GLUCOSE 170*  BUN 34*  CREATININE 1.21  CALCIUM 9.0   GFR: Estimated Creatinine Clearance: 80.8 mL/min (by C-G formula based on SCr of 1.21 mg/dL). Liver Function Tests: No results for input(s): AST, ALT, ALKPHOS, BILITOT, PROT, ALBUMIN in the last 168 hours. No results for input(s): LIPASE, AMYLASE in the last 168 hours. No results for input(s): AMMONIA in the last 168 hours. Coagulation Profile: No results for input(s): INR, PROTIME in the last 168 hours. Cardiac Enzymes: No results for input(s): CKTOTAL, CKMB, CKMBINDEX, TROPONINI in the last 168 hours. BNP (last 3 results) No results for input(s): PROBNP in the last 8760 hours. HbA1C: Recent Labs    11/24/23 2156  HGBA1C 7.0*   CBG: Recent Labs  Lab 11/25/23 1127 11/25/23 1250 11/25/23 1740 11/25/23 2113 11/26/23 0832  GLUCAP 173* 203* 269* 244* 167*   Lipid Profile: No results for input(s): CHOL, HDL, LDLCALC, TRIG, CHOLHDL, LDLDIRECT in the last 72 hours. Thyroid Function Tests: No results for input(s): TSH, T4TOTAL, FREET4, T3FREE, THYROIDAB in the last 72 hours. Anemia Panel: No results for input(s): VITAMINB12, FOLATE, FERRITIN, TIBC, IRON, RETICCTPCT in the last 72 hours. Urine analysis: No results found for: COLORURINE, APPEARANCEUR, LABSPEC, PHURINE, GLUCOSEU, HGBUR, BILIRUBINUR, KETONESUR, PROTEINUR, UROBILINOGEN, NITRITE, LEUKOCYTESUR Sepsis Labs: @LABRCNTIP (procalcitonin:4,lacticidven:4)  )No results found for this or any previous visit (from the past 240 hours).       Radiology  Studies: DG Ankle Complete Right Result Date: 11/25/2023 CLINICAL DATA:  Postoperative evaluation, status post ORIF EXAM: RIGHT ANKLE - COMPLETE 3+ VIEW COMPARISON:  November 24, 2023 ankle radiograph FINDINGS: Sequelae of interval reduction of distal tibia and fibular fractures with plate and screw fixation hardware traversing the distal fibular fracture. There is now near anatomic alignment of the tibiotalar joint. No new fractures are identified. Surgical staples along the lateral aspect of the ankle. Diffuse soft tissue swelling. IMPRESSION: Sequelae of interval ORIF of distal tibia and fibular fractures with improved alignment and no acute adverse features. Electronically Signed   By: Michaeline Blanch M.D.   On: 11/25/2023 13:54   DG MINI C-ARM IMAGE ONLY Result Date: 11/25/2023 There is no interpretation for this exam.  This order is for images obtained during a surgical procedure.  Please See Surgeries Tab for more information regarding the procedure.   Chest Portable 1 View Result Date: 11/24/2023 CLINICAL DATA:  [id number] Volume overload [id number] EXAM: PORTABLE CHEST 1 VIEW COMPARISON:  None Available. FINDINGS: The heart and mediastinal contours are within normal limits. Low lung volumes. Left base atelectasis. No pulmonary edema. No pleural effusion. No pneumothorax. No acute osseous abnormality. IMPRESSION: Low lung volumes with left base atelectasis. Recommend repeat chest x-ray PA and lateral view for further  evaluation. Electronically Signed   By: Morgane  Naveau M.D.   On: 11/24/2023 22:10        Scheduled Meds:  cholecalciferol   2,000 Units Oral Daily   enoxaparin  (LOVENOX ) injection  0.5 mg/kg Subcutaneous Q24H   insulin  aspart  0-9 Units Subcutaneous TID WC   multivitamin with minerals  1 tablet Oral Daily   polyethylene glycol  17 g Oral Daily   pravastatin   40 mg Oral Daily   Ensure Max Protein  11 oz Oral BID   senna-docusate  1 tablet Oral QHS   sertraline   200 mg Oral Daily    Continuous Infusions:     LOS: 2 days     Devaughn KATHEE Ban, MD [facility]   If 7PM-7AM, please contact night-coverage [email or web address] Password [email or web address] 11/26/2023, 10:34 AM

## 2023-11-26 NOTE — Progress Notes (Signed)
   Subjective: 1 Day Post-Op Procedure(s) (LRB): OPEN REDUCTION INTERNAL FIXATION (ORIF) ANKLE FRACTURE (Right) DOS: 11/25/2023  Patient doing well.  Pain tolerated.  Resting comfortably in bed.  No new complaints  Objective: Vital signs in last 24 hours: Temp:  [97.9 F (36.6 C)-98.5 F (36.9 C)] 98.3 F (36.8 C) (07/20 0831) Pulse Rate:  [65-83] 83 (07/20 0831) Resp:  [14-20] 18 (07/20 0831) BP: (81-126)/(32-62) 126/62 (07/20 0831) SpO2:  [93 %-100 %] 93 % (07/20 0930) Weight:  [153.5 kg] (P) 153.5 kg (07/20 0402)  Recent Labs    11/26/23 0513  HGB 10.5*   Recent Labs    11/26/23 0513  WBC 7.3  RBC 3.81*  HCT 34.4*  PLT 155   Recent Labs    11/26/23 0513  NA 140  K 3.9  CL 102  CO2 31  BUN 34*  CREATININE 1.21  GLUCOSE 170*  CALCIUM 9.0   No results for input(s): LABPT, INR in the last 72 hours.  Physical Exam: Dressings and cam boot left intact.  Capillary refill to the toes WNL.  Patient denies any paresthesia or numbness area.  No strikethrough noted on the dressings.  Assessment/Plan: 1 Day Post-Op Procedure(s) (LRB): OPEN REDUCTION INTERNAL FIXATION (ORIF) ANKLE FRACTURE (Right) DOS: 11/25/2023  -Patient seen at bedside this AM -Strict NWB surgical extremity times minimum 8 weeks.  Patient requires admission to a long-term rehab facility. Rec case management consult for placement -Leave dressings and cam boot intact.  Do not remove.  Leave clean dry and intact until first follow-up visit in office, anticipated for this Friday, 12/01/2023 -After 1st week of postop we will resume dressing changes at rehab facility where he is placed/residing -Continue Orthofix External Bone Stimulator 3hrs daily over CAM boot -First postop visit in office 1 week post discharge  -Podiatry will sign off   Thresa CHRISTELLA Sar 11/26/2023, 12:35 PM  Thresa EMERSON Sar, DPM [facility]  Dr. Thresa EMERSON Sar, DPM    [address] [place], [place] [address]                Office [phone]  Fax [phone]

## 2023-11-26 NOTE — Evaluation (Signed)
 Physical Therapy Evaluation Co-Eval with OT Patient Details Name: [name] MRN: [id number] DOB: 28-Dec-1950 Today's Date: 11/26/2023  History of Present Illness  [name] is a 73 y.o. male with medical history significant of morbid obesity, hypertension, type 2 diabetes mellitus, lymphedema, grade 2 diastolic dysfunction, CKD stage IIIa who was referred to the emergency room by [facility] for elective trimalleolar fracture repair.  Patient apparently had an injury about 3 weeks ago after he fell at home.  He was previously seen and evaluated by [facility] and was recommended conservative management with boot.  Patient continues to have significant pain with ambulation and decided to sought second opinion with [facility] today. CT scan done of the ankle joint revealed oblique displaced trimalleolar fracture. He is s/p ORIF right Malleolus and tibiofibular syndesmosis repair on 11/25/2023. Patient is NWB on RLE with wearing CAM Boot.  Clinical Impression  73 yo Male POD #1 from RLE ORIF to ankle after sustaining a trimalleolar fracture after falling a few weeks ago. Patient lives alone but has help from his cousin prn. He has a PMH significant for lymphedema and reports his cousin would come over to his home 2x a week and wrap his legs. As a result he did not shower daily and would only get in shower 1-2 days a week. He ambulated with quad cane prior to fall. After falling, he ambulated with rollator and reports significant decline in mobility due to severe ankle pain. Patient reports sleeping in lift chair at baseline with significant difficulty getting up from regular height surfaces. Today he required min A +1 to transition supine to sitting edge of bed with extra time and verbal cues for positioning. Patient required max A +2 to transfer sit to stand with extra cues/assistance to maintain NWB RLE. Patient able to stand edge of bed x1  min while maintaining NWB on  RLE. He was able to scoot in bed while sitting with min guard and cues for weight bearing precautions/positioning. Patient did require Min A +2 to transition sit to supine requiring assistance to lift both legs back in bed. He does demonstrate swelling in BLE. He would benefit from skilled PT intervention to improve strength and mobility while maintaining weight bearing precautions. During session, he was able to sit edge of bed on room air with SPo2 93-94%. However upon returning supine Spo2 levels dropped to 90%; 1L O2 reapplied. RN Notified.       If plan is discharge home, recommend the following: Two people to help with walking and/or transfers;Two people to help with bathing/dressing/bathroom;Assist for transportation;Assistance with cooking/housework   Can travel by private vehicle   No    Equipment Recommendations None recommended by PT  Recommendations for Other Services       Functional Status Assessment Patient has had a recent decline in their functional status and demonstrates the ability to make significant improvements in function in a reasonable and predictable amount of time.     Precautions / Restrictions Precautions Precautions: Fall Required Braces or Orthoses: Other Brace Other Brace: CAM Boot RLE Restrictions Weight Bearing Restrictions Per Provider Order: Yes RLE Weight Bearing Per Provider Order: Non weight bearing Other Position/Activity Restrictions: While wearing CAM Boot      Mobility  Bed Mobility Overal bed mobility: Needs Assistance Bed Mobility: Supine to Sit, Sit to Supine     Supine to sit: Min assist Sit to supine: Min assist, +2 for physical assistance   General  bed mobility comments: able to scoot up in bed with supervision and moderate verbal cues for hand/foot placement; Overall patient requires extra time/effort for bed mobility. He also requires verbal cues to maintain RLE NWB; He was able to scoot laterally when sitting edge of bed with  min guard +2 and verbal cues for hand placement/positioning. Patient required +2 assist to help maintain RLE NWB and support trunk/body position.    Transfers Overall transfer level: Needs assistance Equipment used: Rolling walker (2 wheels) Transfers: Sit to/from Stand Sit to Stand: Max assist, +2 physical assistance, From elevated surface           General transfer comment: Required elevated bed surface and max A +2 including support to RLE to maintain NWB on RLE; Pt able to stand edge of bed x1 min while maintaining NWB; Unable to take a step with LLE at this time.    Ambulation/Gait               General Gait Details: unable at this time  Stairs            Wheelchair Mobility     Tilt Bed    Modified Rankin (Stroke Patients Only)       Balance Overall balance assessment: Needs assistance, Mild deficits observed, not formally tested Sitting-balance support: Bilateral upper extremity supported, Feet supported Sitting balance-Leahy Scale: Fair Sitting balance - Comments: during dynamic MMT to LLE, patient lost balance posteriorly requiring physical assistance to recover; able to sit statically with hands/feet supported and no loss of balance   Standing balance support: Reliant on assistive device for balance, Bilateral upper extremity supported Standing balance-Leahy Scale: Zero Standing balance comment: Reliant on RW and requires max A +2 for standing balance and positioning                             Pertinent Vitals/Pain Pain Assessment Pain Assessment: 0-10 Pain Score: 6  Pain Location: right ankle Pain Descriptors / Indicators: Throbbing Pain Intervention(s): Limited activity within patient's tolerance, Monitored during session, Premedicated before session, Repositioned    Home Living Family/patient expects to be discharged to:: Private residence Living Arrangements: Alone   Type of Home: House Home Access: Ramped entrance (from  sidewalk to porch)       Home Layout: One level Home Equipment: Shower seat - built in;Shower seat;Toilet riser;Other (comment);Cane - single point;Rollator (4 wheels);Electric scooter;Hospital bed Additional Comments: has bar outside shower; has Lymphedema and gets legs wrapped therefore only takes a shower 1-2x a week;    Prior Function               Mobility Comments: Pt is Mod I for mobility; Pt used quad cane prior to fall and then after fall used rollator; Pt reports hasn't driven in >1 year; Cousin would bring groceries; Pt did laundry; Cousin would cook for him; ADLs Comments: Independent in self care ADLs; family assisted with errands and some cleaning; Pt has hospital bed but would sleep in lift chair     Extremity/Trunk Assessment   Upper Extremity Assessment Upper Extremity Assessment: Defer to OT evaluation    Lower Extremity Assessment Lower Extremity Assessment: RLE deficits/detail;LLE deficits/detail RLE: Unable to fully assess due to immobilization LLE Deficits / Details: strength grossly 3+/5, intact light touch sensation LLE Sensation: WNL LLE Coordination: WNL    Cervical / Trunk Assessment Cervical / Trunk Assessment: Kyphotic  Communication   Communication Communication: No apparent difficulties  Cognition Arousal: Alert Behavior During Therapy: WFL for tasks assessed/performed   PT - Cognitive impairments: No apparent impairments                         Following commands: Intact       Cueing Cueing Techniques: Verbal cues     General Comments General comments (skin integrity, edema, etc.): has purple/red spot on left posterior shoulder with bandaid from recent cyst removal; Demonstrates some edema in BLE with dressings looking tight on RLE; unable to see toes to check circulation; Pt has a history of lymphedema and reports cousin would wrap his legs 2x a week.    Exercises Other Exercises Other Exercises: Educated patient on  role of PT and recommendations; Pt required extensive education/cues on how to maintain NWB on RLE during mobility   Assessment/Plan    PT Assessment Patient needs continued PT services  PT Problem List Decreased strength;Cardiopulmonary status limiting activity;Pain;Decreased range of motion;Decreased activity tolerance;Decreased balance;Decreased mobility       PT Treatment Interventions DME instruction;Balance training;Gait training;Functional mobility training;Patient/family education;Therapeutic activities;Therapeutic exercise    PT Goals (Current goals can be found in the Care Plan section)  Acute Rehab PT Goals Patient Stated Goal: To reduce pain and be able to walk again PT Goal Formulation: With patient Time For Goal Achievement: 12/10/23 Potential to Achieve Goals: Fair    Frequency Min 2X/week     Co-evaluation PT/OT/SLP Co-Evaluation/Treatment: Yes Reason for Co-Treatment: For patient/therapist safety;To address functional/ADL transfers PT goals addressed during session: Mobility/safety with mobility;Balance;Proper use of DME OT goals addressed during session: ADL's and self-care;Proper use of Adaptive equipment and DME       AM-PAC PT 6 Clicks Mobility  Outcome Measure Help needed turning from your back to your side while in a flat bed without using bedrails?: A Little Help needed moving from lying on your back to sitting on the side of a flat bed without using bedrails?: A Little Help needed moving to and from a bed to a chair (including a wheelchair)?: Total Help needed standing up from a chair using your arms (e.g., wheelchair or bedside chair)?: A Lot Help needed to walk in hospital room?: Total Help needed climbing 3-5 steps with a railing? : Total 6 Click Score: 11    End of Session Equipment Utilized During Treatment: Gait belt Activity Tolerance: Patient tolerated treatment well Patient left: in bed;with call bell/phone within reach;with bed alarm  set Nurse Communication: Mobility status PT Visit Diagnosis: Other abnormalities of gait and mobility (R26.89);Muscle weakness (generalized) (M62.81);History of falling (Z91.81);Pain Pain - Right/Left: Right Pain - part of body: Ankle and joints of foot    Time: 9099-9056 PT Time Calculation (min) (ACUTE ONLY): 43 min   Charges:   PT Evaluation $PT Eval Low Complexity: 1 Low PT Treatments $Therapeutic Activity: 23-37 mins PT General Charges $$ ACUTE PT VISIT: 1 Visit          Dail Lerew PT, DPT 11/26/2023, 1:27 PM

## 2023-11-26 NOTE — Evaluation (Signed)
 Occupational Therapy Evaluation Patient Details Name: [name] MRN: [id number] DOB: 19-Oct-1950 Today's Date: 11/26/2023   History of Present Illness   Pt is a 73 y.o. male who was referred to the ER by [facility] for elective trimalleolar fracture repair s/p fall 3 weeks ago. Pt is now POD1 R ORIF trimalleolar ankle fracture and repair of right tibiofibular syndesmosis injury. PMH of morbid obesity, hypertension, DM2, lymphedema, grade 2 diastolic dysfunction, CKD stage IIIa     Clinical Impressions Pt was seen for PT/OT co-evaluation this date to maximize pt/therapist safety. PTA, pt resides in a one level home with a ramp to enter alone. He is typically MOD I with ambulation using a QC, but recently began using a rollator after the fall. He is typically MOD I/IND with ADL management and has family assist with cleaning and meals at times. Cousin brings his groceries.   Pt presents to acute OT demonstrating impaired ADL performance and functional mobility 2/2 pain, weakness, balance deficits, and limited endurance. Pain at 6/10 in RLE-premedicated. On 2L initially and placed on RA for session with sp02 >90% until return to supine and placed back on 1L. Pt currently requires Min A for supine to sit at EOB with assist to maintain NWB through RLE. Pt educated on strict NWB in RLE and wearing CAM boot at all times. He did lose balance posteriorly during dynamic seated task, but CGA for static and able to lateral scoot to HOB using BUEs and LLE with bed height slightly elevated with CGA and increased time/rest breaks. He required increased bed height and Max A x2 for STS from EOB to RW and cues and blocking to prevent weight bearing through RLE initially, then progressed to standing ~44min with ability to maintain NWB. Pt fatigues easily and required chux pad change from small BM. With use of bed features and bed in trendelenburg he scooted to [facility] with SBA and cueing. Anticipate Max A for  LB ADL management and CGA for UB ADL management at this time. Pt would benefit from skilled OT services to address noted impairments and functional limitations to maximize safety and independence while minimizing falls risk and caregiver burden. Do anticipate the need for follow up OT services upon acute hospital DC.     If plan is discharge home, recommend the following:   Two people to help with walking and/or transfers;A lot of help with bathing/dressing/bathroom;Assistance with cooking/housework;Assist for transportation     Functional Status Assessment   Patient has had a recent decline in their functional status and demonstrates the ability to make significant improvements in function in a reasonable and predictable amount of time.     Equipment Recommendations   Other (comment);BSC/3in1;Wheelchair (measurements OT);Hospital bed (defer)     Recommendations for Other Services         Precautions/Restrictions   Precautions Precautions: Fall Required Braces or Orthoses: Other Brace Other Brace: CAM Boot RLE Restrictions Weight Bearing Restrictions Per Provider Order: Yes RLE Weight Bearing Per Provider Order: Non weight bearing Other Position/Activity Restrictions: CAM boot not to be removed at any time     Mobility Bed Mobility Overal bed mobility: Needs Assistance Bed Mobility: Supine to Sit, Sit to Supine     Supine to sit: Min assist Sit to supine: Min assist, +2 for physical assistance   General bed mobility comments: use of bed features and assist for RLE initiation to reach EOB, Min A x2 for BLE management to return to supine; able  to lateral scoot towards [facility] with increased time and effort with CGA x2 for safety; scooted to [facility] using headboard rails with bed in trendelenburg with SUP    Transfers Overall transfer level: Needs assistance Equipment used: Rolling walker (2 wheels) Transfers: Sit to/from Stand Sit to Stand: Max assist, +2 physical  assistance, From elevated surface           General transfer comment: increased bed height and Max A x2 for STS from EOB to RW with increased cueing and support to maintain NWB on RLE; stood ~77min with inability to progress mobility further      Balance Overall balance assessment: Needs assistance, Mild deficits observed, not formally tested Sitting-balance support: Bilateral upper extremity supported, Feet supported Sitting balance-Leahy Scale: Fair Sitting balance - Comments: loses balance posteriorly during dynamic seated activity   Standing balance support: Reliant on assistive device for balance, Bilateral upper extremity supported Standing balance-Leahy Scale: Zero Standing balance comment: Max A x2 and RW use to stand from EOB and maintain NWB                           ADL either performed or assessed with clinical judgement   ADL Overall ADL's : Needs assistance/impaired                                       General ADL Comments: pt requiring Min A x1-2 for bed mobility tasks, anticipate Max A for LB ADL management and supervision for UB ADL management     Vision         Perception         Praxis         Pertinent Vitals/Pain Pain Assessment Pain Assessment: 0-10 Pain Score: 6  Pain Location: right ankle Pain Descriptors / Indicators: Throbbing Pain Intervention(s): Limited activity within patient's tolerance, Monitored during session, Repositioned, Premedicated before session     Extremity/Trunk Assessment Upper Extremity Assessment Upper Extremity Assessment: Overall WFL for tasks assessed   Lower Extremity Assessment Lower Extremity Assessment: Defer to PT evaluation RLE: Unable to fully assess due to immobilization LLE Deficits / Details: strength grossly 3+/5, intact light touch sensation LLE Sensation: WNL LLE Coordination: WNL   Cervical / Trunk Assessment Cervical / Trunk Assessment: Kyphotic   Communication  Communication Communication: No apparent difficulties   Cognition Arousal: Alert Behavior During Therapy: WFL for tasks assessed/performed                                 Following commands: Intact       Cueing  General Comments   Cueing Techniques: Verbal cues  recent cyst removal on upper back/L shoulder blade region; h/o lymphedema with wrapping of BLEs; on 2L 02 initially, able to be removed throughout session and maintained >90%, but with return to bed noted to drop to 90% so placed back on 1L 02   Exercises Other Exercises Other Exercises: Edu on role of OT in acute setting. Frequent reminders for NWB status will all movement.   Shoulder Instructions      Home Living Family/patient expects to be discharged to:: Private residence Living Arrangements: Alone   Type of Home: House Home Access: Ramped entrance (from sidewalk to porch)     Home Layout: One level  Bathroom Shower/Tub: Producer, television/film/video: Standard     Home Equipment: Shower seat - built in;Shower seat;Toilet riser;Other (comment);Cane - single point;Rollator (4 wheels);Electric scooter;Hospital bed   Additional Comments: has bar outside shower; has Lymphedema and gets legs wrapped therefore only takes a shower 1-2x a week;      Prior Functioning/Environment               Mobility Comments: Pt is Mod I for mobility; Pt used quad cane prior to fall and then after fall used rollator; Pt reports hasn't driven in >1 year; Cousin would bring groceries; Pt did laundry; Cousin would cook for him; ADLs Comments: Independent in self care ADLs; family assisted with errands and some cleaning; Pt has hospital bed but would sleep in lift chair    OT Problem List: Decreased strength;Pain;Increased edema;Decreased activity tolerance;Impaired balance (sitting and/or standing)   OT Treatment/Interventions: Self-care/ADL training;Therapeutic exercise;Patient/family education;Balance  training;Therapeutic activities;DME and/or AE instruction      OT Goals(Current goals can be found in the care plan section)   Acute Rehab OT Goals Patient Stated Goal: decrease pain and improve function OT Goal Formulation: With patient Time For Goal Achievement: 12/10/23 Potential to Achieve Goals: Fair ADL Goals Pt Will Perform Upper Body Bathing: with supervision;sitting Pt Will Perform Lower Body Dressing: with min assist;with adaptive equipment;sitting/lateral leans;sit to/from stand Pt Will Transfer to Toilet: stand pivot transfer;bedside commode;with min assist;with mod assist   OT Frequency:  Min 2X/week    Co-evaluation PT/OT/SLP Co-Evaluation/Treatment: Yes Reason for Co-Treatment: For patient/therapist safety;To address functional/ADL transfers PT goals addressed during session: Mobility/safety with mobility;Balance;Proper use of DME OT goals addressed during session: ADL's and self-care;Proper use of Adaptive equipment and DME      AM-PAC OT 6 Clicks Daily Activity     Outcome Measure Help from another person eating meals?: None Help from another person taking care of personal grooming?: A Little Help from another person toileting, which includes using toliet, bedpan, or urinal?: Total Help from another person bathing (including washing, rinsing, drying)?: Total Help from another person to put on and taking off regular upper body clothing?: A Lot Help from another person to put on and taking off regular lower body clothing?: A Lot 6 Click Score: 13   End of Session Equipment Utilized During Treatment: Rolling walker (2 wheels);Gait belt Nurse Communication: Mobility status  Activity Tolerance: Patient tolerated treatment well Patient left: in bed;with call bell/phone within reach;with bed alarm set  OT Visit Diagnosis: Other abnormalities of gait and mobility (R26.89);Unsteadiness on feet (R26.81);Muscle weakness (generalized) (M62.81);Pain Pain - Right/Left:  Right Pain - part of body: Ankle and joints of foot                Time: 9099-9054 OT Time Calculation (min): 45 min Charges:  OT General Charges $OT Visit: 1 Visit OT Evaluation $OT Eval Moderate Complexity: 1 Mod Taysia Rivere, OTR/L  11/26/23, 2:30 PM  Sherran Margolis E Chon Buhl 11/26/2023, 2:22 PM

## 2023-11-26 NOTE — Plan of Care (Signed)

## 2023-11-27 ENCOUNTER — Encounter: Payer: Self-pay | Admitting: Podiatry

## 2023-11-27 ENCOUNTER — Ambulatory Visit: Admitting: Physician Assistant

## 2023-11-27 LAB — GLUCOSE, CAPILLARY
Glucose-Capillary: 174 mg/dL — ABNORMAL HIGH (ref 70–99)
Glucose-Capillary: 204 mg/dL — ABNORMAL HIGH (ref 70–99)
Glucose-Capillary: 198 mg/dL — ABNORMAL HIGH (ref 70–99)
Glucose-Capillary: 169 mg/dL — ABNORMAL HIGH (ref 70–99)
Glucose-Capillary: 199 mg/dL — ABNORMAL HIGH (ref 70–99)

## 2023-11-27 MED ORDER — INSULIN GLARGINE-YFGN 100 UNIT/ML ~~LOC~~ SOLN: 5.0000 [IU] | Freq: Every day | SUBCUTANEOUS | Status: DC

## 2023-11-27 MED ORDER — BISOPROLOL FUMARATE 5 MG PO TABS
5.0000 mg | ORAL_TABLET | Freq: Every day | ORAL | Status: DC
  Administered 2023-11-28 – 2023-11-29 (×2): 5 mg via ORAL
  Filled 2023-11-27 (×2): qty 1

## 2023-11-27 MED ORDER — GLIMEPIRIDE 1 MG PO TABS
1.0000 mg | ORAL_TABLET | Freq: Every day | ORAL | Status: DC
Start: 2023-11-27 — End: 2023-11-29
  Administered 2023-11-27 – 2023-11-29 (×3): 1 mg via ORAL
  Filled 2023-11-27 (×3): qty 1

## 2023-11-27 MED ORDER — PIOGLITAZONE HCL 15 MG PO TABS
15.0000 mg | ORAL_TABLET | Freq: Every day | ORAL | Status: DC
Start: 2023-11-27 — End: 2023-11-29
  Administered 2023-11-27 – 2023-11-29 (×3): 15 mg via ORAL
  Filled 2023-11-27 (×3): qty 1

## 2023-11-27 NOTE — TOC Initial Note (Signed)
 Transition of Care [facility]) - Initial/Assessment Note    Patient Details  Name: [name] MRN: [id number] Date of Birth: [birth year]  [facility] CM/SW Contact:    Corean ONEIDA Haddock, RN Phone Number: 11/27/2023, 4:45 PM  Clinical Narrative:                  Therapy recs for SNF.  Patient confirms he is interested in SNF Per [place] at the [facility] patient is only 50% service connect and not eligible for SNF placement through [facility]  Per patient he has HTA benefits, and card in on file. Requested patient registration to add HTA to hospital chart  PASRR obtained Fl2 sent for signature Bed search initiated          Patient Goals and CMS Choice            Expected Discharge Plan and Services                                              Prior Living Arrangements/Services                       Activities of Daily Living   ADL Screening (condition at time of admission) Independently performs ADLs?: No Does the patient have a NEW difficulty with bathing/dressing/toileting/self-feeding that is expected to last >3 days?: Yes (Initiates electronic notice to provider for possible OT consult) Does the patient have a NEW difficulty with getting in/out of bed, walking, or climbing stairs that is expected to last >3 days?: Yes (Initiates electronic notice to provider for possible PT consult) Does the patient have a NEW difficulty with communication that is expected to last >3 days?: No Is the patient deaf or have difficulty hearing?: Yes Does the patient have difficulty seeing, even when wearing glasses/contacts?: Yes Does the patient have difficulty concentrating, remembering, or making decisions?: No  Permission Sought/Granted                  Emotional Assessment              Admission diagnosis:  Unstable ankle [M25.373] Ankle fracture [S82.899A] Ankle fracture, bimalleolar, closed, right, sequela [S82.841S] Patient Active Problem List    Diagnosis Date Noted   Ankle fracture, bimalleolar, closed, right, sequela 11/24/2023   Wound infection 04/26/2021   Chronic diastolic CHF (congestive heart failure) (HCC) 04/26/2021   CKD (chronic kidney disease), stage IIIa 04/26/2021   Iron deficiency anemia 04/26/2021   Difficulty in walking, not elsewhere classified 01/14/2021   AKI (acute kidney injury) (HCC) 12/03/2020   Hyperkalemia 12/03/2020   Edema 07/02/2020   Hypertensive heart disease without congestive heart failure 07/02/2020   Other personal history presenting hazards to health 07/02/2020   Morbid obesity (HCC) 07/02/2020   Peripheral vascular disease (HCC) 07/02/2020   Cataract 07/02/2020   Psychosexual dysfunction with inhibited sexual excitement 07/02/2020   Issue of medical certificate for disability examination 07/02/2020   Tobacco use disorder 07/02/2020   Umbilical hernia 07/02/2020   Pain due to onychomycosis of toenails of both feet 01/23/2020   Venous ulcer (HCC) 04/11/2019   Chronic venous insufficiency 07/07/2016   Venous stasis dermatitis of both lower extremities 07/07/2016   Lymphedema 07/07/2016   Type II diabetes mellitus with renal manifestations (HCC) 07/07/2016   Essential hypertension 07/07/2016   Hyperlipidemia 07/07/2016   PCP:  Dexter Harlene BIRCH, PA-C Pharmacy:   [facility] [address]), [place] - [address] [place]) [place] [address] Phone: [phone] Fax: [phone]     Social Drivers of Health (SDOH) Social History: SDOH Screenings   Food Insecurity: No Food Insecurity (11/24/2023)  Housing: Unknown (11/24/2023)  Transportation Needs: No Transportation Needs (11/24/2023)  Utilities: Not At Risk (11/24/2023)  Social Connections: Unknown (11/24/2023)  Tobacco Use: Medium Risk (11/24/2023)   SDOH Interventions:     Readmission Risk Interventions     No data to display

## 2023-11-27 NOTE — [place] FL2 (Signed)
 [place]  MEDICAID FL2 LEVEL OF CARE FORM     IDENTIFICATION  Patient Name: [name] Birthdate: February 06, 1951 Sex: male Admission Date (Current Location): 11/24/2023  [facility] and [place] Number:  Chiropodist and Address:         Provider Number: [id number]  Attending Physician Name and Address:  Kandis Devaughn Sayres, MD  Relative Name and Phone Number:       Current Level of Care: Hospital Recommended Level of Care: Skilled Nursing Facility Prior Approval Number:    Date Approved/Denied:   PASRR Number: [id number]  Discharge Plan: SNF    Current Diagnoses: Patient Active Problem List   Diagnosis Date Noted   Ankle fracture, bimalleolar, closed, right, sequela 11/24/2023   Wound infection 04/26/2021   Chronic diastolic CHF (congestive heart failure) (HCC) 04/26/2021   CKD (chronic kidney disease), stage IIIa 04/26/2021   Iron deficiency anemia 04/26/2021   Difficulty in walking, not elsewhere classified 01/14/2021   AKI (acute kidney injury) (HCC) 12/03/2020   Hyperkalemia 12/03/2020   Edema 07/02/2020   Hypertensive heart disease without congestive heart failure 07/02/2020   Other personal history presenting hazards to health 07/02/2020   Morbid obesity (HCC) 07/02/2020   Peripheral vascular disease (HCC) 07/02/2020   Cataract 07/02/2020   Psychosexual dysfunction with inhibited sexual excitement 07/02/2020   Issue of medical certificate for disability examination 07/02/2020   Tobacco use disorder 07/02/2020   Umbilical hernia 07/02/2020   Pain due to onychomycosis of toenails of both feet 01/23/2020   Venous ulcer (HCC) 04/11/2019   Chronic venous insufficiency 07/07/2016   Venous stasis dermatitis of both lower extremities 07/07/2016   Lymphedema 07/07/2016   Type II diabetes mellitus with renal manifestations (HCC) 07/07/2016   Essential hypertension 07/07/2016   Hyperlipidemia 07/07/2016    Orientation RESPIRATION BLADDER Height & Weight      Self, Time, Situation, Place  Normal (2L [place]) Incontinent Weight: (!) 155.1 kg Height:     BEHAVIORAL SYMPTOMS/MOOD NEUROLOGICAL BOWEL NUTRITION STATUS      Incontinent Diet (Carb modified)  AMBULATORY STATUS COMMUNICATION OF NEEDS Skin   Extensive Assist Verbally Bruising (non pressure wound to ankle)                       Personal Care Assistance Level of Assistance              Functional Limitations Info             SPECIAL CARE FACTORS FREQUENCY  PT (By licensed PT), OT (By licensed OT)                    Contractures Contractures Info: Not present    Additional Factors Info  Code Status, Allergies Code Status Info: DNR Allergies Info: NKDA           Current Medications (11/27/2023):  This is the current hospital active medication list Current Facility-Administered Medications  Medication Dose Route Frequency Provider Last Rate Last Admin   bisacodyl  (DULCOLAX) EC tablet 5 mg  5 mg Oral Daily PRN Acheampong, Peter K, MD       [START ON 11/28/2023] bisoprolol  (ZEBETA ) tablet 5 mg  5 mg Oral Daily Wouk, Devaughn Sayres, MD       cholecalciferol  (VITAMIN D3) 25 MCG (1000 UNIT) tablet 2,000 Units  2,000 Units Oral Daily Acheampong, Peter K, MD   2,000 Units at 11/27/23 9078   enoxaparin  (LOVENOX ) injection 72.5 mg  0.5 mg/kg Subcutaneous Q24H  Marca Maude POUR, MD   72.5 mg at 11/26/23 2305   furosemide  (LASIX ) tablet 40 mg  40 mg Oral Daily Kandis Devaughn Sayres, MD   40 mg at 11/27/23 9077   glimepiride  (AMARYL ) tablet 1 mg  1 mg Oral Daily Wouk, Devaughn Sayres, MD       HYDROmorphone  (DILAUDID ) injection 1 mg  1 mg Intravenous Q4H PRN Wouk, Devaughn Sayres, MD       insulin  aspart (novoLOG ) injection 0-9 Units  0-9 Units Subcutaneous TID WC Acheampong, Peter K, MD   2 Units at 11/27/23 1233   multivitamin with minerals tablet 1 tablet  1 tablet Oral Daily Kandis Devaughn Sayres, MD   1 tablet at 11/27/23 9078   oxyCODONE  (Oxy IR/ROXICODONE ) immediate release tablet  5-10 mg  5-10 mg Oral Q4H PRN Kandis Devaughn Sayres, MD   10 mg at 11/27/23 9078   pioglitazone  (ACTOS ) tablet 15 mg  15 mg Oral Daily Wouk, Devaughn Sayres, MD       polyethylene glycol (MIRALAX  / GLYCOLAX ) packet 17 g  17 g Oral Daily Kandis Devaughn Sayres, MD   17 g at 11/27/23 9077   pravastatin  (PRAVACHOL ) tablet 40 mg  40 mg Oral Daily Acheampong, Peter K, MD   40 mg at 11/27/23 9078   protein supplement (ENSURE MAX) liquid  11 oz Oral BID Kandis Devaughn Sayres, MD   11 oz at 11/27/23 9077   senna-docusate (Senokot-S) tablet 1 tablet  1 tablet Oral QHS Wouk, Devaughn Sayres, MD       sertraline  (ZOLOFT ) tablet 200 mg  200 mg Oral Daily Acheampong, Peter K, MD   200 mg at 11/27/23 9077     Discharge Medications: Please see discharge summary for a list of discharge medications.  Relevant Imaging Results:  Relevant Lab Results:   Additional Information ss [id number]  Corean ONEIDA Haddock, RN

## 2023-11-27 NOTE — Progress Notes (Signed)
 PROGRESS NOTE    [name]  FMW:[id number] DOB: 20-Aug-1950 DOA: 11/24/2023 PCP: Dexter Harlene BIRCH, PA-C  Outpatient Specialists: [facility]    Brief Narrative:   From admission h and p [name] is a 73 y.o. male with medical history significant of morbid obesity, hypertension, type 2 diabetes mellitus, lymphedema, grade 2 diastolic dysfunction, CKD stage IIIa who was referred to the emergency room by [facility] for elective trimalleolar fracture repair.  Patient apparently had an injury about 3 weeks ago after he fell at home.  He was previously seen and evaluated by [facility] and was recommended conservative management with boot.  Patient continues to have significant pain with ambulation and decided to sought second opinion with [facility] today.   CT scan done of the ankle joint revealed oblique displaced trimalleolar fracture.  Patient was referred to the hospital for admission with plans for ORIF scheduled for tomorrow AM.   Patient denies any history of coronary disease.  At baseline, he was able to ambulate without any exertional dyspnea prior to the injury.  No recent chest pain. EKG has been ordered and pending.   Assessment & Plan:   Principal Problem:   Ankle fracture, bimalleolar, closed, right, sequela Active Problems:   Type II diabetes mellitus with renal manifestations (HCC)   Essential hypertension   Morbid obesity (HCC)   Peripheral vascular disease (HCC)   Chronic diastolic CHF (congestive heart failure) (HCC)   CKD (chronic kidney disease), stage IIIa  # Right bimaleolar ankle fracture with syndesmotic injury Post op from operative repair on 7/19 with podiatry - per podiatry's 7/20 note: nwb right leg at least 8 weeks, leave dressings and cam boot on until f/u 7/25, use orthofix external bone stimulator 3 hours daily - pain control, bowel regimen. Had a BM on 7/20  # Acute hypoxic respiratory failure Sats in upper  80s off oxygen. Denies dyspnea or cough. Suspect atelectasis and CTA on 7/20 confirms this, no PE or signs fluid overload - continue IC - continue O2, wean as able  # CKD 2/3a Kidney function at baseline  # T2DM Glucose elevated - SSI  - resume home glimepiride , pioglitazone  in preparation for discharge  # Morbid obesity - noted  # HFpEF Appears compensated - cont home furosemide , bisoprolol   # HTN BPs soft after surgery, improving - resuming home bisoprolol  - home hydral, lisinopril  on hold  # MDD - home sertraline   # Debility Lives alone, ambulates w/ a walker at baseline. PT/OT advising snf - TOC consulted for SNF    DVT prophylaxis: lovenox  Code Status: dnr/dni, patient confirms this on 7/19 Family Communication: cousin Alisa updated telephonically 7/21  Level of care: Med-Surg Status is: Inpatient Remains inpatient appropriate because: severity of illness    Consultants:  podiatry  Procedures: See above  Antimicrobials:  Peri-operative    Subjective: Reports stable right ankle pain, no other complaints. BM yesterday  Objective: Vitals:   11/26/23 2000 11/27/23 0412 11/27/23 0430 11/27/23 0804  BP: 124/65  (!) 105/55 121/69  Pulse: 77  74 72  Resp: 20  20 16   Temp: 100.1 F (37.8 C)  98.6 F (37 C) 98.5 F (36.9 C)  TempSrc:   Oral Oral  SpO2: 98%  98% 97%  Weight:  (!) 155.1 kg      Intake/Output Summary (Last 24 hours) at 11/27/2023 1457 Last data filed at 11/27/2023 1300 Gross per 24 hour  Intake 640 ml  Output  700 ml  Net -60 ml   Filed Weights   11/26/23 0402 11/27/23 0412  Weight: (!) (P) 153.5 kg (!) 155.1 kg    Examination:  General exam: Appears calm and comfortable  Respiratory system: Clear to auscultation save for bibasilar rales. Respiratory effort normal. Cardiovascular system: S1 & S2 heard, RRR.   Gastrointestinal system: Abdomen is obese, soft and nontender.   Central nervous system: Alert and oriented. No  focal neurological deficits. Extremities:  right foot in boot Skin: No visible rashes, lesions or ulcers Psychiatry: Judgement and insight appear normal. Mood & affect appropriate.     Data Reviewed: I have personally reviewed following labs and imaging studies  CBC: Recent Labs  Lab 11/26/23 0513  WBC 7.3  HGB 10.5*  HCT 34.4*  MCV 90.3  PLT 155   Basic Metabolic Panel: Recent Labs  Lab 11/26/23 0513  NA 140  K 3.9  CL 102  CO2 31  GLUCOSE 170*  BUN 34*  CREATININE 1.21  CALCIUM 9.0   GFR: Estimated Creatinine Clearance: 84.6 mL/min (by C-G formula based on SCr of 1.21 mg/dL). Liver Function Tests: No results for input(s): AST, ALT, ALKPHOS, BILITOT, PROT, ALBUMIN in the last 168 hours. No results for input(s): LIPASE, AMYLASE in the last 168 hours. No results for input(s): AMMONIA in the last 168 hours. Coagulation Profile: No results for input(s): INR, PROTIME in the last 168 hours. Cardiac Enzymes: No results for input(s): CKTOTAL, CKMB, CKMBINDEX, TROPONINI in the last 168 hours. BNP (last 3 results) No results for input(s): PROBNP in the last 8760 hours. HbA1C: Recent Labs    11/24/23 2156  HGBA1C 7.0*   CBG: Recent Labs  Lab 11/26/23 1231 11/26/23 1620 11/26/23 2304 11/27/23 0804 11/27/23 1149  GLUCAP 174* 158* 218* 204* 198*   Lipid Profile: No results for input(s): CHOL, HDL, LDLCALC, TRIG, CHOLHDL, LDLDIRECT in the last 72 hours. Thyroid Function Tests: No results for input(s): TSH, T4TOTAL, FREET4, T3FREE, THYROIDAB in the last 72 hours. Anemia Panel: No results for input(s): VITAMINB12, FOLATE, FERRITIN, TIBC, IRON, RETICCTPCT in the last 72 hours. Urine analysis: No results found for: COLORURINE, APPEARANCEUR, LABSPEC, PHURINE, GLUCOSEU, HGBUR, BILIRUBINUR, KETONESUR, PROTEINUR, UROBILINOGEN, NITRITE, LEUKOCYTESUR Sepsis  Labs: @LABRCNTIP (procalcitonin:4,lacticidven:4)  )No results found for this or any previous visit (from the past 240 hours).       Radiology Studies: CT Angio Chest Pulmonary Embolism (PE) W or WO Contrast Result Date: 11/26/2023 CLINICAL DATA:  Hypoxia.  Elevated D-dimer. EXAM: CT ANGIOGRAPHY CHEST WITH CONTRAST TECHNIQUE: Multidetector CT imaging of the chest was performed using the standard protocol during bolus administration of intravenous contrast. Multiplanar CT image reconstructions and MIPs were obtained to evaluate the vascular anatomy. RADIATION DOSE REDUCTION: This exam was performed according to the departmental dose-optimization program which includes automated exposure control, adjustment of the mA and/or kV according to patient size and/or use of iterative reconstruction technique. CONTRAST:  OMNIPAQUE  IOHEXOL  350 MG/ML SOLN, 75mL OMNIPAQUE  IOHEXOL  350 MG/ML SOLN COMPARISON:  Chest radiograph, 11/24/2023. FINDINGS: Cardiovascular: Pulmonary arteries are relatively well opacified to the proximal segmental level. More peripherally there is suboptimal opacification of the smaller segmental and subsegmental vessels limiting their assessment. Allowing for this mild limitation, there is no evidence of a pulmonary embolism. Heart is normal size. No pericardial effusion. Great vessels are normal in caliber. No aortic dissection. Arch branch vessels are widely patent. Mediastinum/Nodes: No neck base, mediastinal or hilar masses or enlarged lymph nodes. Trachea esophagus are unremarkable. Lungs/Pleura: Minimal subsegmental  atelectasis in the dependent lower lobes and right upper lobe. Lungs otherwise clear. No pleural effusion or pneumothorax. Upper Abdomen: No acute abnormality. Musculoskeletal: No fracture or acute finding. No bone lesion. No chest wall mass. Review of the MIP images confirms the above findings. IMPRESSION: 1. No evidence of a pulmonary embolism. Study mildly limited as  detailed above. 2. No acute findings. Electronically Signed   By: Alm Parkins M.D.   On: 11/26/2023 19:37        Scheduled Meds:  cholecalciferol   2,000 Units Oral Daily   enoxaparin  (LOVENOX ) injection  0.5 mg/kg Subcutaneous Q24H   furosemide   40 mg Oral Daily   insulin  aspart  0-9 Units Subcutaneous TID WC   multivitamin with minerals  1 tablet Oral Daily   polyethylene glycol  17 g Oral Daily   pravastatin   40 mg Oral Daily   Ensure Max Protein  11 oz Oral BID   senna-docusate  1 tablet Oral QHS   sertraline   200 mg Oral Daily   Continuous Infusions:     LOS: 3 days     Devaughn KATHEE Ban, MD [facility]   If 7PM-7AM, please contact night-coverage [email or web address] Password [facility] 11/27/2023, 2:57 PM

## 2023-11-28 LAB — GLUCOSE, CAPILLARY
Glucose-Capillary: 170 mg/dL — ABNORMAL HIGH (ref 70–99)
Glucose-Capillary: 204 mg/dL — ABNORMAL HIGH (ref 70–99)
Glucose-Capillary: 162 mg/dL — ABNORMAL HIGH (ref 70–99)
Glucose-Capillary: 209 mg/dL — ABNORMAL HIGH (ref 70–99)

## 2023-11-28 MED ORDER — INSULIN ASPART 100 UNIT/ML IJ SOLN
2.0000 [IU] | Freq: Three times a day (TID) | INTRAMUSCULAR | Status: DC
  Administered 2023-11-29 (×2): 2 [IU] via SUBCUTANEOUS
  Filled 2023-11-28 (×2): qty 1

## 2023-11-28 NOTE — Progress Notes (Signed)
 Physical Therapy Treatment Patient Details Name: [name] MRN: [id number] DOB: Sep 15, 1950 Today's Date: 11/28/2023   History of Present Illness Pt is a 73 y.o. male who was referred to the ER by [facility] for elective trimalleolar fracture repair s/p fall 3 weeks ago. Pt is now POD1 R ORIF trimalleolar ankle fracture and repair of right tibiofibular syndesmosis injury. PMH of morbid obesity, hypertension, DM2, lymphedema, grade 2 diastolic dysfunction, CKD stage IIIa    PT Comments  Co-treat with OT performed this date. Today's tx involved continuation of transfer attempts to increase overall strength and activity tolerance. Pt performing bed mob min a-mod a with LE limb negotiation, multimodal cues needed for rail use and sequencing. STS x3 attempts unsuccessful d/t pt unable to clear bottom, multimodal cueing needed for attempts. Pt with increased time able to scoot minimally towards [facility] with multimodal cueing, pt clearly extremely fatigued. Pt overall continues to need skilled PT interventions to meet therapy goals. PT to follow acutely as appropriate.    If plan is discharge home, recommend the following: Two people to help with walking and/or transfers;Two people to help with bathing/dressing/bathroom;Assist for transportation;Assistance with cooking/housework   Can travel by private vehicle     No  Equipment Recommendations  None recommended by PT    Recommendations for Other Services       Precautions / Restrictions Precautions Precautions: Fall Recall of Precautions/Restrictions: Intact Other Brace: CAM Boot RLE Restrictions Weight Bearing Restrictions Per Provider Order: Yes RLE Weight Bearing Per Provider Order: Non weight bearing Other Position/Activity Restrictions: CAM boot not to be removed at any time     Mobility  Bed Mobility Overal bed mobility: Needs Assistance Bed Mobility: Rolling, Supine to Sit, Sit to Supine Rolling: Min assist    Supine to sit: Min assist, +2 for physical assistance, HOB elevated, Used rails Sit to supine: Mod assist, Min assist, +2 for physical assistance, Used rails   General bed mobility comments: multimodal cueing on bed mobility, pt able to progress involvement but continues to be very fatigued with any sort of movement    Transfers Overall transfer level: Needs assistance Equipment used: Rolling walker (2 wheels) Transfers: Sit to/from Stand Sit to Stand: Max assist, +2 physical assistance, From elevated surface           General transfer comment: x3 attempts, unable to clear >1 inch off the bed, height elevated    Ambulation/Gait               General Gait Details: unable at this time   Stairs             Wheelchair Mobility     Tilt Bed    Modified Rankin (Stroke Patients Only)       Balance Overall balance assessment: Needs assistance Sitting-balance support: Bilateral upper extremity supported, Feet supported Sitting balance-Leahy Scale: Fair Sitting balance - Comments: sitting EOB       Standing balance comment: NT                            Communication Communication Communication: Impaired Factors Affecting Communication: Hearing impaired  Cognition Arousal: Alert Behavior During Therapy: WFL for tasks assessed/performed   PT - Cognitive impairments: No apparent impairments                         Following commands: Intact      Cueing  Cueing Techniques: Verbal cues, Tactile cues, Gestural cues  Exercises      General Comments        Pertinent Vitals/Pain Pain Assessment Pain Assessment: 0-10 Pain Score: 4  Pain Location: right ankle Pain Descriptors / Indicators: Throbbing Pain Intervention(s): Monitored during session    Home Living                          Prior Function            PT Goals (current goals can now be found in the care plan section) Acute Rehab PT Goals Patient  Stated Goal: To reduce pain and be able to walk again PT Goal Formulation: With patient Time For Goal Achievement: 12/10/23 Potential to Achieve Goals: Fair Progress towards PT goals: Progressing toward goals    Frequency    Min 2X/week      PT Plan      Co-evaluation PT/OT/SLP Co-Evaluation/Treatment: Yes Reason for Co-Treatment: For patient/therapist safety;To address functional/ADL transfers PT goals addressed during session: Mobility/safety with mobility;Balance;Proper use of DME        AM-PAC PT 6 Clicks Mobility   Outcome Measure  Help needed turning from your back to your side while in a flat bed without using bedrails?: A Little Help needed moving from lying on your back to sitting on the side of a flat bed without using bedrails?: A Lot Help needed moving to and from a bed to a chair (including a wheelchair)?: Total Help needed standing up from a chair using your arms (e.g., wheelchair or bedside chair)?: Total Help needed to walk in hospital room?: Total Help needed climbing 3-5 steps with a railing? : Total 6 Click Score: 9    End of Session Equipment Utilized During Treatment: Oxygen Activity Tolerance: Patient tolerated treatment well Patient left: in bed;with call bell/phone within reach;with bed alarm set Nurse Communication: Mobility status;Other (comment) (nee of linen change) PT Visit Diagnosis: Other abnormalities of gait and mobility (R26.89);Muscle weakness (generalized) (M62.81);History of falling (Z91.81);Pain Pain - Right/Left: Right Pain - part of body: Ankle and joints of foot     Time: 8881-8851 PT Time Calculation (min) (ACUTE ONLY): 30 min  Charges:                               Vikas Wegmann Romero-Perozo, SPT  11/28/2023, 12:34 PM

## 2023-11-28 NOTE — Inpatient Diabetes Management (Signed)
 Inpatient Diabetes Program Recommendations  AACE/ADA: New Consensus Statement on Inpatient Glycemic Control (2015)  Target Ranges:  Prepandial:   less than 140 mg/dL      Peak postprandial:   less than 180 mg/dL (1-2 hours)      Critically ill patients:  140 - 180 mg/dL   Lab Results  Component Value Date   GLUCAP 204 (H) 11/28/2023   HGBA1C 7.0 (H) 11/24/2023    Review of Glycemic Control  Latest Reference Range & Units 11/27/23 11:49 11/27/23 15:57 11/27/23 20:11 11/28/23 07:57 11/28/23 11:15  Glucose-Capillary 70 - 99 mg/dL 801 (H) 830 (H) 800 (H) 170 (H) 204 (H)   Diabetes history: DM 2 Outpatient Diabetes medications:  Amaryl  1 mg daily Actos  15 mg daily Current orders for Inpatient glycemic control:  Novolog  0-9 units tid with meals   Inpatient Diabetes Program Recommendations:   If appropriate, consider adding Novolog  2 units tid with meals (hold if patient eats less than 50% or NPO).   Thanks,  Randall Bullocks, RN, BC-ADM Inpatient Diabetes Coordinator Pager [phone]  (8a-5p)

## 2023-11-28 NOTE — Progress Notes (Signed)
 Occupational Therapy Treatment Patient Details Name: [name] MRN: [id number] DOB: [birth year] Today's Date: 11/28/2023   History of present illness Pt is a 73 y.o. male who was referred to the ER by [facility] for elective trimalleolar fracture repair s/p fall 3 weeks ago. Pt is now POD1 R ORIF trimalleolar ankle fracture and repair of right tibiofibular syndesmosis injury. PMH of morbid obesity, hypertension, DM2, lymphedema, grade 2 diastolic dysfunction, CKD stage IIIa   OT comments  Pt seen for OT tx. Session focused on UB ADLs and grooming tasks bed level as pt reports fatigue. Able to perform face washing with setup (min vcs to don [place] after removal to wash face), MIN A to wash hair with shampoo cap and comb back of head, MIN A for UB bathing/dressing. Pt is able to roll with MAX A and use of bed features. Pt making progress towards goals, will continue to work towards seated EOB and mobility next session. Discharge recommendation appropriate. Patient will benefit from continued inpatient follow up therapy, <3 hours/day       If plan is discharge home, recommend the following:  Two people to help with walking and/or transfers;A lot of help with bathing/dressing/bathroom;Assistance with cooking/housework;Assist for transportation   Equipment Recommendations  Other (comment);BSC/3in1;Wheelchair (measurements OT);Hospital bed       Precautions / Restrictions Precautions Precautions: Fall Recall of Precautions/Restrictions: Intact Restrictions Weight Bearing Restrictions Per Provider Order: Yes RLE Weight Bearing Per Provider Order: Non weight bearing Other Position/Activity Restrictions: CAM boot not to be removed at any time       Mobility Bed Mobility Overal bed mobility: Needs Assistance Bed Mobility: Rolling           General bed mobility comments: maxA with use of bed features to roll for OT to wash pt's back    Transfers Overall transfer level:  Needs assistance                 General transfer comment: NT     Balance Overall balance assessment: Needs assistance, Mild deficits observed, not formally tested     Sitting balance - Comments: NT                                   ADL either performed or assessed with clinical judgement   ADL Overall ADL's : Needs assistance/impaired     Grooming: Wash/dry hands;Wash/dry face;Brushing hair;Set up;Bed level;Minimal assistance Grooming Details (indicate cue type and reason): washes face/hands bed level with setup, MIN A to apply shampoo cap and comb hair Upper Body Bathing: Minimal assistance;Bed level Upper Body Bathing Details (indicate cue type and reason): minA due to body habitus and unable to reach                           General ADL Comments: Bed level session per pt request due to pain levels and fatigue     Communication Communication Communication: No apparent difficulties   Cognition Arousal: Alert Behavior During Therapy: [facility] for tasks assessed/performed                                 Following commands: Intact        Cueing   Cueing Techniques: Verbal cues  Pertinent Vitals/ Pain       Pain Assessment Pain Assessment: 0-10 Pain Score: 6  Pain Location: right ankle Pain Descriptors / Indicators: Throbbing Pain Intervention(s): Limited activity within patient's tolerance, Monitored during session, Repositioned   Frequency  Min 2X/week        Progress Toward Goals  OT Goals(current goals can now be found in the care plan section)  Progress towards OT goals: Progressing toward goals  Acute Rehab OT Goals OT Goal Formulation: With patient Time For Goal Achievement: 12/10/23 Potential to Achieve Goals: Fair ADL Goals Pt Will Perform Upper Body Bathing: with supervision;sitting Pt Will Perform Lower Body Dressing: with min assist;with adaptive equipment;sitting/lateral leans;sit  to/from stand Pt Will Transfer to Toilet: stand pivot transfer;bedside commode;with min assist;with mod assist  Plan         AM-PAC OT 6 Clicks Daily Activity     Outcome Measure   Help from another person eating meals?: None Help from another person taking care of personal grooming?: A Little Help from another person toileting, which includes using toliet, bedpan, or urinal?: Total Help from another person bathing (including washing, rinsing, drying)?: Total Help from another person to put on and taking off regular upper body clothing?: A Lot Help from another person to put on and taking off regular lower body clothing?: A Lot 6 Click Score: 13    End of Session Equipment Utilized During Treatment: Oxygen  OT Visit Diagnosis: Other abnormalities of gait and mobility (R26.89);Unsteadiness on feet (R26.81);Muscle weakness (generalized) (M62.81);Pain Pain - Right/Left: Right Pain - part of body: Ankle and joints of foot   Activity Tolerance Patient tolerated treatment well   Patient Left in bed;with call bell/phone within reach;with bed alarm set;with nursing/sitter in room   Nurse Communication Mobility status        Time:  - 8469-8446   OT Time Calculation (min): 23 min  $OT Visit: 1 Visit OT Treatments $Self Care/Home Management : 23-37 mins  Charges:   $  Delon L. Rashon Westrup, OTR/L  11/28/23, 8:31 AM

## 2023-11-28 NOTE — Plan of Care (Signed)
  Problem: Clinical Measurements: Goal: Ability to maintain clinical measurements within normal limits will improve Outcome: Progressing Goal: Will remain free from infection Outcome: Progressing Goal: Diagnostic test results will improve Outcome: Progressing Goal: Respiratory complications will improve Outcome: Progressing Goal: Cardiovascular complication will be avoided Outcome: Progressing   Problem: Activity: Goal: Risk for activity intolerance will decrease Outcome: Progressing   Problem: Nutrition: Goal: Adequate nutrition will be maintained Outcome: Progressing   Problem: Coping: Goal: Level of anxiety will decrease Outcome: Progressing   Problem: Elimination: Goal: Will not experience complications related to bowel motility Outcome: Progressing Goal: Will not experience complications related to urinary retention Outcome: Progressing   Problem: Pain Managment: Goal: General experience of comfort will improve and/or be controlled Outcome: Progressing   Problem: Safety: Goal: Ability to remain free from injury will improve Outcome: Progressing   Problem: Skin Integrity: Goal: Risk for impaired skin integrity will decrease Outcome: Progressing   Problem: Education: Goal: Ability to describe self-care measures that may prevent or decrease complications (Diabetes Survival Skills Education) will improve Outcome: Progressing Goal: Individualized Educational Video(s) Outcome: Progressing   Problem: Coping: Goal: Ability to adjust to condition or change in health will improve Outcome: Progressing   Problem: Fluid Volume: Goal: Ability to maintain a balanced intake and output will improve Outcome: Progressing   Problem: Health Behavior/Discharge Planning: Goal: Ability to identify and utilize available resources and services will improve Outcome: Progressing Goal: Ability to manage health-related needs will improve Outcome: Progressing   Problem:  Metabolic: Goal: Ability to maintain appropriate glucose levels will improve Outcome: Progressing   Problem: Nutritional: Goal: Maintenance of adequate nutrition will improve Outcome: Progressing Goal: Progress toward achieving an optimal weight will improve Outcome: Progressing   Problem: Skin Integrity: Goal: Risk for impaired skin integrity will decrease Outcome: Progressing   Problem: Tissue Perfusion: Goal: Adequacy of tissue perfusion will improve Outcome: Progressing

## 2023-11-28 NOTE — Progress Notes (Signed)
 PROGRESS NOTE    [name]  FMW:[id number]  DOB: [birth year]  DOA: 11/24/2023 PCP: Dexter Harlene BIRCH, PA-C Outpatient Specialists:   Hospital course: 73 y.o. male with medical history significant of morbid obesity, hypertension, type 2 diabetes mellitus, lymphedema, grade 2 diastolic dysfunction, CKD stage IIIa who was referred to the emergency room by [facility] for elective trimalleolar fracture repair.  Patient underwent operative repair on 7/19 by podiatry.  Subjective:  Patient seen in conjunction with his wife, they both note that it is frustrating for him to be lying in bed.  They are worried that he might get a pneumonia.  I spoke with them about how important it is to use incentive spirometer several times a day, to take deep breaths and to make sure he sitting upright when he is eating.  Patient denies any new cough or shortness of breath at present.   Objective: Vitals:   11/28/23 0515 11/28/23 0756 11/28/23 1109 11/28/23 1551  BP: (!) 142/76 (!) 123/59 (!) 141/88 (!) 104/55  Pulse: 96 75 83 72  Resp: 15 18 19 18   Temp: 98.4 F (36.9 C) 99.8 F (37.7 C) 99 F (37.2 C) 98.1 F (36.7 C)  TempSrc: Oral Oral Oral Oral  SpO2: 92% (!) 89% 95% 96%  Weight:        Intake/Output Summary (Last 24 hours) at 11/28/2023 1615 Last data filed at 11/28/2023 1453 Gross per 24 hour  Intake 1080 ml  Output 1650 ml  Net -570 ml   Filed Weights   11/26/23 0402 11/27/23 0412 11/28/23 0500  Weight: (!) (P) 153.5 kg (!) 155.1 kg (!) 155 kg     Exam:  General: Obese man lying in bed at 10 degrees in NAD Eyes: sclera anicteric, conjuctiva mild injection bilaterally CVS: S1-S2, regular  Respiratory:  decreased air entry bilaterally secondary to body habitus GI: NABS, soft, NT, obese LE: Right foot in boot Neuro: A/O x 3,  grossly nonfocal.  Psych: patient is logical and coherent, judgement and insight appear normal, mood and affect appropriate to  situation.  Data Reviewed:  Basic Metabolic Panel: Recent Labs  Lab 11/26/23 0513  NA 140  K 3.9  CL 102  CO2 31  GLUCOSE 170*  BUN 34*  CREATININE 1.21  CALCIUM 9.0    CBC: Recent Labs  Lab 11/26/23 0513  WBC 7.3  HGB 10.5*  HCT 34.4*  MCV 90.3  PLT 155     Scheduled Meds:  bisoprolol   5 mg Oral Daily   cholecalciferol   2,000 Units Oral Daily   enoxaparin  (LOVENOX ) injection  0.5 mg/kg Subcutaneous Q24H   furosemide   40 mg Oral Daily   glimepiride   1 mg Oral Daily   insulin  aspart  0-9 Units Subcutaneous TID WC   multivitamin with minerals  1 tablet Oral Daily   pioglitazone   15 mg Oral Daily   polyethylene glycol  17 g Oral Daily   pravastatin   40 mg Oral Daily   Ensure Max Protein  11 oz Oral BID   senna-docusate  1 tablet Oral QHS   sertraline   200 mg Oral Daily   Continuous Infusions:   Assessment & Plan:   Hypoxic respiratory failure Atelectasis Requiring 2 L O2 to keep O2 sats greater than 97 Atelectasis on CTA on 7/20 no PE or signs fluid overload Continue to wean O2 Discussed importance of using incentive spirometer and sitting upright to prevent pneumonia  DM 2 Persistent hyperglycemia Will add  aspart 2 units 3 times daily AC per diabetes coronary recommendations Continue home doses of glimepiride , pioglitazone  in preparation for discharge  HFpEF No shortness of breath, patient appears compensated Continue home doses of Lasix  and bisoprolol   HTN BP remains low normal on home bisoprolol  Continue to hold home doses of hydral, lisinopril      Copied and pasted from previous note, not directly addressed today: # Right bimaleolar ankle fracture with syndesmotic injury Post op from operative repair on 7/19 with podiatry - per podiatry's 7/20 note: nwb right leg at least 8 weeks, leave dressings and cam boot on until f/u 7/25, use orthofix external bone stimulator 3 hours daily - pain control, bowel regimen. Had a BM on 7/20   # Acute  hypoxic respiratory failure Requiring 2 L O2 to keep O2 sats greater than 97 suspect atelectasis and CTA on 7/20 confirms this, no PE or signs fluid overload - continue IC - continue O2, wean as able   # CKD 2/3a Kidney function at baseline   # MDD - home sertraline    # Debility Lives alone, ambulates w/ a walker at baseline. PT/OT advising snf - TOC consulted for SNF     DVT prophylaxis: Lovenox  Code Status: DNR limited Family Communication: Sister at bedside throughout     Studies: CT Angio Chest Pulmonary Embolism (PE) W or WO Contrast Result Date: 11/26/2023 CLINICAL DATA:  Hypoxia.  Elevated D-dimer. EXAM: CT ANGIOGRAPHY CHEST WITH CONTRAST TECHNIQUE: Multidetector CT imaging of the chest was performed using the standard protocol during bolus administration of intravenous contrast. Multiplanar CT image reconstructions and MIPs were obtained to evaluate the vascular anatomy. RADIATION DOSE REDUCTION: This exam was performed according to the departmental dose-optimization program which includes automated exposure control, adjustment of the mA and/or kV according to patient size and/or use of iterative reconstruction technique. CONTRAST:  OMNIPAQUE  IOHEXOL  350 MG/ML SOLN, 75mL OMNIPAQUE  IOHEXOL  350 MG/ML SOLN COMPARISON:  Chest radiograph, 11/24/2023. FINDINGS: Cardiovascular: Pulmonary arteries are relatively well opacified to the proximal segmental level. More peripherally there is suboptimal opacification of the smaller segmental and subsegmental vessels limiting their assessment. Allowing for this mild limitation, there is no evidence of a pulmonary embolism. Heart is normal size. No pericardial effusion. Great vessels are normal in caliber. No aortic dissection. Arch branch vessels are widely patent. Mediastinum/Nodes: No neck base, mediastinal or hilar masses or enlarged lymph nodes. Trachea esophagus are unremarkable. Lungs/Pleura: Minimal subsegmental atelectasis in the  dependent lower lobes and right upper lobe. Lungs otherwise clear. No pleural effusion or pneumothorax. Upper Abdomen: No acute abnormality. Musculoskeletal: No fracture or acute finding. No bone lesion. No chest wall mass. Review of the MIP images confirms the above findings. IMPRESSION: 1. No evidence of a pulmonary embolism. Study mildly limited as detailed above. 2. No acute findings. Electronically Signed   By: Alm Parkins M.D.   On: 11/26/2023 19:37    Principal Problem:   Ankle fracture, bimalleolar, closed, right, sequela Active Problems:   Type II diabetes mellitus with renal manifestations (HCC)   Essential hypertension   Morbid obesity (HCC)   Peripheral vascular disease (HCC)   Chronic diastolic CHF (congestive heart failure) (HCC)   CKD (chronic kidney disease), stage IIIa     Sylvestre Rathgeber Tublu Ziaire Bieser, [facility]  If 7PM-7AM, please contact night-coverage [email or web address]   LOS: 4 days

## 2023-11-28 NOTE — Progress Notes (Signed)
 Occupational Therapy Treatment Patient Details Name: [name] MRN: [id number] DOB: [birth year] Today's Date: 11/28/2023   History of present illness Pt is a 73 y.o. male who was referred to the ER by [facility] for elective trimalleolar fracture repair s/p fall 3 weeks ago. Pt is now POD1 R ORIF trimalleolar ankle fracture and repair of right tibiofibular syndesmosis injury. PMH of morbid obesity, hypertension, DM2, lymphedema, grade 2 diastolic dysfunction, CKD stage IIIa   OT comments  Pt seen for OT/PT co-tx this date. RN provides pain meds just prior to session for optimal performance. Pt requires MIN - MOD A for bed mobility with increased time. Pt agreeable to attempt STS but unable to clear posterior from elevated bed surface while maintaining NWB of RLE. Max multimodal cuing for hand placement and use of RW with +2 given and ultimately unsuccessful. Pt exteremly fatigued, requires rest breaks between all transitions and is able to laterally scoot towards HOB with MAX A +2. Discharge recommendation remains appropriate, OT will continue to progress as able.       If plan is discharge home, recommend the following:  Two people to help with walking and/or transfers;A lot of help with bathing/dressing/bathroom;Assistance with cooking/housework;Assist for transportation   Equipment Recommendations  Other (comment);BSC/3in1;Wheelchair (measurements OT);Hospital bed;Hoyer lift       Precautions / Restrictions Precautions Precautions: Fall Recall of Precautions/Restrictions: Intact Required Braces or Orthoses: Other Brace Other Brace: CAM Boot RLE Restrictions Weight Bearing Restrictions Per Provider Order: Yes RLE Weight Bearing Per Provider Order: Non weight bearing Other Position/Activity Restrictions: CAM boot not to be removed at any time       Mobility Bed Mobility Overal bed mobility: Needs Assistance Bed Mobility: Rolling, Supine to Sit, Sit to  Supine Rolling: Min assist   Supine to sit: Min assist, +2 for physical assistance, HOB elevated, Used rails Sit to supine: Mod assist, Min assist, +2 for physical assistance, Used rails   General bed mobility comments: multimodal cueing on bed mobility, pt fatigues very quickly    Transfers Overall transfer level: Needs assistance Equipment used: Rolling walker (2 wheels) Transfers: Sit to/from Stand Sit to Stand: Max assist, +2 physical assistance, From elevated surface           General transfer comment: attempted 3 trials to stand, pt unable to clear posterior off bed while maintaining NWB on RLE     Balance Overall balance assessment: Needs assistance Sitting-balance support: Bilateral upper extremity supported, Feet supported Sitting balance-Leahy Scale: Fair Sitting balance - Comments: sitting EOB   Standing balance support: Reliant on assistive device for balance, Bilateral upper extremity supported Standing balance-Leahy Scale: Zero Standing balance comment: NT                           ADL either performed or assessed with clinical judgement   ADL Overall ADL's : Needs assistance/impaired                                       General ADL Comments: Session focused on building tolerance to upright seated position. Anticipate pt will be MAX - TOTAL for LB ADLs     Communication Communication Communication: Impaired Factors Affecting Communication: Hearing impaired   Cognition Arousal: Alert Behavior During Therapy: WFL for tasks assessed/performed Cognition: No apparent impairments  OT - Cognition Comments: slow processing at times                 Following commands: Intact        Cueing   Cueing Techniques: Verbal cues, Tactile cues, Gestural cues             Pertinent Vitals/ Pain       Pain Assessment Pain Assessment: 0-10 Pain Score: 4  Pain Location: right ankle Pain Descriptors /  Indicators: Throbbing Pain Intervention(s): Limited activity within patient's tolerance, Premedicated before session, Repositioned, Monitored during session         Frequency  Min 2X/week        Progress Toward Goals  OT Goals(current goals can now be found in the care plan section)  Progress towards OT goals: Progressing toward goals  Acute Rehab OT Goals OT Goal Formulation: With patient Time For Goal Achievement: 12/10/23 Potential to Achieve Goals: Fair  Plan      Co-evaluation    PT/OT/SLP Co-Evaluation/Treatment: Yes Reason for Co-Treatment: For patient/therapist safety;To address functional/ADL transfers PT goals addressed during session: Mobility/safety with mobility;Balance;Proper use of DME OT goals addressed during session: ADL's and self-care;Proper use of Adaptive equipment and DME      AM-PAC OT 6 Clicks Daily Activity     Outcome Measure   Help from another person eating meals?: None Help from another person taking care of personal grooming?: A Little Help from another person toileting, which includes using toliet, bedpan, or urinal?: Total Help from another person bathing (including washing, rinsing, drying)?: Total Help from another person to put on and taking off regular upper body clothing?: A Lot Help from another person to put on and taking off regular lower body clothing?: A Lot 6 Click Score: 13    End of Session Equipment Utilized During Treatment: Oxygen  OT Visit Diagnosis: Other abnormalities of gait and mobility (R26.89);Unsteadiness on feet (R26.81);Muscle weakness (generalized) (M62.81);Pain Pain - Right/Left: Right Pain - part of body: Ankle and joints of foot   Activity Tolerance Patient tolerated treatment well   Patient Left in bed;with call bell/phone within reach;with bed alarm set   Nurse Communication Mobility status        Time: 8881-8850 OT Time Calculation (min): 31 min  Charges: OT General Charges $OT Visit:  1 Visit OT Treatments $Self Care/Home Management : 8-22 mins Adwoa Axe L. Rielynn Trulson, OTR/L  11/28/23, 3:55 PM

## 2023-11-28 NOTE — Plan of Care (Signed)

## 2023-11-28 NOTE — Progress Notes (Signed)
 PIV removed with approval for no active line received from Dr. Dana. Catheter intact upon removal. Insertion site clean, dry, and intact. Gauze and tape placed over site. Will continue to follow plan of care.   Handoff report given to [facility] RN at this time.

## 2023-11-28 NOTE — TOC Progression Note (Signed)
 Transition of Care [facility]) - Progression Note    Patient Details  Name: [name] MRN: [id number] Date of Birth: 27-Jul-1950  [facility]) CM/SW Contact  Corean ONEIDA Haddock, RN Phone Number: 11/28/2023, 11:55 AM  Clinical Narrative:      Met with patient and cousin [name] at bedside.  Presented Bed offers. CMS Medicare.gov Compare Post Acute Care list reviewed with patient.  Patient accepts bed at [facility].  Accepted in HUB and notified Tammy at [facility]  VM left for HTA request auth for [facility] not [facility] transport        Expected Discharge Plan and Services                                               Social Determinants of Health (SDOH) Interventions SDOH Screenings   Food Insecurity: No Food Insecurity (11/24/2023)  Housing: Unknown (11/24/2023)  Transportation Needs: No Transportation Needs (11/24/2023)  Utilities: Not At Risk (11/24/2023)  Social Connections: Unknown (11/24/2023)  Tobacco Use: Medium Risk (11/24/2023)    Readmission Risk Interventions     No data to display

## 2023-11-29 LAB — CBC
HCT: 32.5 % — ABNORMAL LOW (ref 39.0–52.0)
Hemoglobin: 10 g/dL — ABNORMAL LOW (ref 13.0–17.0)
MCH: 27.2 pg (ref 26.0–34.0)
MCHC: 30.8 g/dL (ref 30.0–36.0)
MCV: 88.6 fL (ref 80.0–100.0)
Platelets: 162 K/uL (ref 150–400)
RBC: 3.67 MIL/uL — ABNORMAL LOW (ref 4.22–5.81)
RDW: 16 % — ABNORMAL HIGH (ref 11.5–15.5)
WBC: 5.7 K/uL (ref 4.0–10.5)
nRBC: 0 % (ref 0.0–0.2)

## 2023-11-29 LAB — GLUCOSE, CAPILLARY
Glucose-Capillary: 138 mg/dL — ABNORMAL HIGH (ref 70–99)
Glucose-Capillary: 214 mg/dL — ABNORMAL HIGH (ref 70–99)

## 2023-11-29 MED ORDER — OXYCODONE HCL 5 MG PO TABS: 5.0000 mg | ORAL_TABLET | Freq: Four times a day (QID) | ORAL | 0 refills | Status: AC | PRN

## 2023-11-29 NOTE — Hospital Course (Addendum)
 [name] is a 73 y.o. male with morbid obesity, hypertension, type 2 diabetes, lymphedema, grade 2 diastolic dysfunction, CKD stage IIIa, who is referred to the emergency department by [facility] for elective trimalleolar fracture repair.  He underwent operative repair on 7/19.  Postoperative course was complicated by hypoxia secondary to atelectasis.  CTA was performed which confirmed no pulmonary embolism or volume overload.  No evidence of pneumonia.  He was encouraged to use incentive spirometry, sit upright, and mobilize out of bed as often as possible. Patient was cleared for podiatry for discharge with outpatient follow-up on 7/25.  TOC was consulted and arranged for SNF placement.  Hypoxic respiratory failure Atelectasis - CTA 7/20: No PE, no evidence of volume overload.  No fever or WBC to suggest pneumonia.  No infiltrate on CT - Continue to wean O2 as tolerated - Continue using I-S, OOB as frequently as able  Right bimalleolar ankle fracture with syndesmotic injury - Operative repair 7/19 with podiatry - Per podiatry: Nonweightbearing right leg at least 8 weeks, leave dressings and cam boot on until follow-up in office 7/25.  Use Orthofix external bone stimulator 3 hours daily. - Continue pain control and bowel regimen.  Type 2 diabetes - Home glimepiride , pioglitazone . - May need additional sliding scale while at SNF  Hypertension - BP is low/normal on home dose bisoprolol  - Hydralazine , lasix , lisinopril  held at discharge.  May resume as blood pressure allows  CKD stage IIIa - Kidney function at baseline - Outpatient follow-up  MDD - Home dose sertraline   Debility - Deconditioned given the above.  Lives alone, ambulates with walker at baseline - PT/OT advising SNF.  Discharging directly to SNF today.  Normocytic anemia - Hemoglobin 10.5 on arrival, is down trended to 10.0.  No tachycardia.  Baseline appears to be between 9 and 11.

## 2023-11-29 NOTE — Plan of Care (Signed)
 Report called to [facility], [facility] transported [facility]

## 2023-11-29 NOTE — Progress Notes (Signed)
 Physical Therapy Treatment Patient Details Name: [name] MRN: [id number] DOB: 12/16/1950 Today's Date: 11/29/2023   History of Present Illness Pt is a 73 y.o. male who was referred to the ER by [facility] for elective trimalleolar fracture repair s/p fall 3 weeks ago. Pt is now POD1 R ORIF trimalleolar ankle fracture and repair of right tibiofibular syndesmosis injury. PMH of morbid obesity, hypertension, DM2, lymphedema, grade 2 diastolic dysfunction, CKD stage IIIa    PT Comments  Co-treat with OT this date. Pt found in supine in bed, when asked to roll to sit up, found to have a BM. PT/OT assisted pt with rolling with CGA/min a with cueing to use hand rails, assisted with pericare where pt continued to have BM while getting cleaned up. Pt unable to tell when he is having a BM. PT to follow acutely as appropriate.     If plan is discharge home, recommend the following: Two people to help with walking and/or transfers;Two people to help with bathing/dressing/bathroom;Assist for transportation;Assistance with cooking/housework   Can travel by private vehicle     No  Equipment Recommendations  None recommended by PT    Recommendations for Other Services       Precautions / Restrictions Precautions Precautions: Fall Recall of Precautions/Restrictions: Intact Required Braces or Orthoses: Other Brace Other Brace: CAM Boot RLE Restrictions Weight Bearing Restrictions Per Provider Order: Yes RLE Weight Bearing Per Provider Order: Non weight bearing Other Position/Activity Restrictions: CAM boot not to be removed at any time     Mobility  Bed Mobility Overal bed mobility: Needs Assistance Bed Mobility: Rolling Rolling: Min assist, Contact guard assist         General bed mobility comments: rolling in bed this date to assist with pericare d/t continuous BM in bed    Transfers                   General transfer comment: NT    Ambulation/Gait                General Gait Details: unable at this time   Stairs             Wheelchair Mobility     Tilt Bed    Modified Rankin (Stroke Patients Only)       Balance       Sitting balance - Comments: NT       Standing balance comment: NT                            Communication Communication Communication: Impaired Factors Affecting Communication: Hearing impaired  Cognition Arousal: Alert Behavior During Therapy: WFL for tasks assessed/performed   PT - Cognitive impairments: No apparent impairments                         Following commands: Intact      Cueing Cueing Techniques: Verbal cues, Tactile cues  Exercises Other Exercises Other Exercises: assisted with pericare in bed, pt incontinent and can't tell when he is having a BM, pt intermittegly using bed pan    General Comments        Pertinent Vitals/Pain Pain Assessment Pain Assessment: Faces Faces Pain Scale: Hurts a little bit Pain Location: right ankle Pain Descriptors / Indicators: Throbbing Pain Intervention(s): Monitored during session    Home Living  Prior Function            PT Goals (current goals can now be found in the care plan section) Acute Rehab PT Goals Patient Stated Goal: To reduce pain and be able to walk again PT Goal Formulation: With patient Time For Goal Achievement: 12/10/23 Potential to Achieve Goals: Fair Progress towards PT goals: Progressing toward goals    Frequency    Min 2X/week      PT Plan      Co-evaluation PT/OT/SLP Co-Evaluation/Treatment: Yes Reason for Co-Treatment: For patient/therapist safety;To address functional/ADL transfers PT goals addressed during session: Mobility/safety with mobility;Balance;Proper use of DME OT goals addressed during session: ADL's and self-care;Proper use of Adaptive equipment and DME      AM-PAC PT 6 Clicks Mobility   Outcome Measure   Help needed turning from your back to your side while in a flat bed without using bedrails?: A Little Help needed moving from lying on your back to sitting on the side of a flat bed without using bedrails?: A Lot Help needed moving to and from a bed to a chair (including a wheelchair)?: Total Help needed standing up from a chair using your arms (e.g., wheelchair or bedside chair)?: Total Help needed to walk in hospital room?: Total Help needed climbing 3-5 steps with a railing? : Total 6 Click Score: 9    End of Session   Activity Tolerance: Other (comment) (session limited d/t continuous BM) Patient left: in bed;with call bell/phone within reach;with bed alarm set Nurse Communication: Mobility status PT Visit Diagnosis: Other abnormalities of gait and mobility (R26.89);Muscle weakness (generalized) (M62.81);History of falling (Z91.81);Pain Pain - Right/Left: Right Pain - part of body: Ankle and joints of foot     Time: 8672-8642 PT Time Calculation (min) (ACUTE ONLY): 30 min  Charges:                              Abu Heavin Romero-Perozo, SPT  11/29/2023, 2:06 PM

## 2023-11-29 NOTE — Plan of Care (Signed)

## 2023-11-29 NOTE — Discharge Summary (Signed)
 DISCHARGE SUMMARY    [name] FMW:[id number] DOB: 1951/04/18 DOA: 11/24/2023  PCP: Dexter Harlene BIRCH, PA-C  Admit date: 11/24/2023 Discharge date: 11/29/2023   Recommendations for Outpatient Follow-up:  Follow up with PCP in 1-2 weeks to review kidney disease and diabetes management See podiatry as scheduled on 7/25   Hospital Course: [name] is a 73 y.o. male with morbid obesity, hypertension, type 2 diabetes, lymphedema, grade 2 diastolic dysfunction, CKD stage IIIa, who is referred to the emergency department by [facility] for elective trimalleolar fracture repair.  He underwent operative repair on 7/19.  Postoperative course was complicated by hypoxia secondary to atelectasis.  CTA was performed which confirmed no pulmonary embolism or volume overload.  No evidence of pneumonia.  He was encouraged to use incentive spirometry, sit upright, and mobilize out of bed as often as possible. Patient was cleared for podiatry for discharge with outpatient follow-up on 7/25.  TOC was consulted and arranged for SNF placement.  Hypoxic respiratory failure Atelectasis - CTA 7/20: No PE, no evidence of volume overload.  No fever or WBC to suggest pneumonia.  No infiltrate on CT - Continue to wean O2 as tolerated - Continue using I-S, OOB as frequently as able  Right bimalleolar ankle fracture with syndesmotic injury - Operative repair 7/19 with podiatry - Per podiatry: Nonweightbearing right leg at least 8 weeks, leave dressings and cam boot on until follow-up in office 7/25.  Use Orthofix external bone stimulator 3 hours daily. - Continue pain control and bowel regimen.  Type 2 diabetes - Home glimepiride , pioglitazone . - May need additional sliding scale while at SNF  Hypertension - BP is low/normal on home dose bisoprolol  - Hydralazine , lasix , lisinopril  held at discharge.  May resume as blood pressure allows  CKD stage IIIa - Kidney function at baseline -  Outpatient follow-up  MDD - Home dose sertraline   Debility - Deconditioned given the above.  Lives alone, ambulates with walker at baseline - PT/OT advising SNF.  Discharging directly to SNF today.  Normocytic anemia - Hemoglobin 10.5 on arrival, is down trended to 10.0.  No tachycardia.  Baseline appears to be between 9 and 11.    Discharge Instructions  Discharge Instructions     Call MD for:  difficulty breathing, headache or visual disturbances   Complete by: As directed    Call MD for:  persistant dizziness or light-headedness   Complete by: As directed    Call MD for:  persistant nausea and vomiting   Complete by: As directed    Call MD for:  severe uncontrolled pain   Complete by: As directed    Call MD for:  temperature >100.4   Complete by: As directed    Diet Carb Modified   Complete by: As directed    Discharge instructions   Complete by: As directed    Follow-up with podiatry on 7/25 as scheduled.  Follow-up closely with primary care to review your kidney disease and diabetes management.    We had made some changes to your blood pressure medications.  Please keep a blood pressure log and follow-up with your primary care physician to review this blood pressure log and make further medication changes as needed.  Please review your medication list carefully and ensure you are taking the right pills at home.   Discharge wound care:   Complete by: As directed    Nonweightbearing right leg at least 8 weeks, leave dressings and cam boot on until  follow-up in office 7/25.  Use Orthofix external bone stimulator 3 hours daily.   Increase activity slowly   Complete by: As directed       Allergies as of 11/29/2023   No Known Allergies      Medication List     PAUSE taking these medications    furosemide  20 MG tablet Wait to take this until your doctor or other care provider tells you to start again. Commonly known as: LASIX  Take 2 tablets (40 mg total) by mouth  daily. What changed: additional instructions       STOP taking these medications    aspirin  81 MG chewable tablet   doxycycline  100 MG capsule Commonly known as: VIBRAMYCIN    Ensure Max Protein Liqd   ferrous sulfate  325 (65 FE) MG EC tablet   Fish Oil 1000 MG Caps   hydrALAZINE  25 MG tablet Commonly known as: APRESOLINE    lisinopril  40 MG tablet Commonly known as: ZESTRIL    loperamide 2 MG capsule Commonly known as: IMODIUM   mupirocin  ointment 2 % Commonly known as: BACTROBAN    silver  sulfADIAZINE  1 % cream Commonly known as: SILVADENE        TAKE these medications    bisoprolol  5 MG tablet Commonly known as: ZEBETA  Take 1 tablet by mouth daily.   Cholecalciferol  25 MCG (1000 UT) tablet Take 2 tablets by mouth daily.   glimepiride  1 MG tablet Commonly known as: AMARYL  Take 1 tablet by mouth daily.   multivitamin with minerals Tabs tablet Take 1 tablet by mouth daily.   nystatin  powder Commonly known as: MYCOSTATIN /NYSTOP  Apply to toe crevices with unna wrap changes   oxyCODONE  5 MG immediate release tablet Commonly known as: Oxy IR/ROXICODONE  Take 1 tablet (5 mg total) by mouth every 6 (six) hours as needed for up to 3 days for severe pain (pain score 7-10) (for pain not relieved by tylenol ).   pioglitazone  15 MG tablet Commonly known as: ACTOS  Take 15 mg by mouth daily.   pravastatin  80 MG tablet Commonly known as: PRAVACHOL  Take 40 mg by mouth daily. daily   sertraline  100 MG tablet Commonly known as: ZOLOFT  Take 200 mg by mouth daily.               Discharge Care Instructions  (From admission, onward)           Start     Ordered   11/29/23 0000  Discharge wound care:       Comments: Nonweightbearing right leg at least 8 weeks, leave dressings and cam boot on until follow-up in office 7/25.  Use Orthofix external bone stimulator 3 hours daily.   11/29/23 1225            Contact information for after-discharge care      Destination     [facility]   Service: Skilled Nursing Contact information: [address] [place]  [phone]                    No Known Allergies  Consultations:    Procedures/Studies: CT Angio Chest Pulmonary Embolism (PE) W or WO Contrast Result Date: 11/26/2023 CLINICAL DATA:  Hypoxia.  Elevated D-dimer. EXAM: CT ANGIOGRAPHY CHEST WITH CONTRAST TECHNIQUE: Multidetector CT imaging of the chest was performed using the standard protocol during bolus administration of intravenous contrast. Multiplanar CT image reconstructions and MIPs were obtained to evaluate the vascular anatomy. RADIATION DOSE REDUCTION: This exam was performed according to the  departmental dose-optimization program which includes automated exposure control, adjustment of the mA and/or kV according to patient size and/or use of iterative reconstruction technique. CONTRAST:  OMNIPAQUE  IOHEXOL  350 MG/ML SOLN, 75mL OMNIPAQUE  IOHEXOL  350 MG/ML SOLN COMPARISON:  Chest radiograph, 11/24/2023. FINDINGS: Cardiovascular: Pulmonary arteries are relatively well opacified to the proximal segmental level. More peripherally there is suboptimal opacification of the smaller segmental and subsegmental vessels limiting their assessment. Allowing for this mild limitation, there is no evidence of a pulmonary embolism. Heart is normal size. No pericardial effusion. Great vessels are normal in caliber. No aortic dissection. Arch branch vessels are widely patent. Mediastinum/Nodes: No neck base, mediastinal or hilar masses or enlarged lymph nodes. Trachea esophagus are unremarkable. Lungs/Pleura: Minimal subsegmental atelectasis in the dependent lower lobes and right upper lobe. Lungs otherwise clear. No pleural effusion or pneumothorax. Upper Abdomen: No acute abnormality. Musculoskeletal: No fracture or acute finding. No bone lesion. No chest wall mass. Review of the MIP images  confirms the above findings. IMPRESSION: 1. No evidence of a pulmonary embolism. Study mildly limited as detailed above. 2. No acute findings. Electronically Signed   By: Alm Parkins M.D.   On: 11/26/2023 19:37   DG Ankle Complete Right Result Date: 11/25/2023 CLINICAL DATA:  Postoperative evaluation, status post ORIF EXAM: RIGHT ANKLE - COMPLETE 3+ VIEW COMPARISON:  November 24, 2023 ankle radiograph FINDINGS: Sequelae of interval reduction of distal tibia and fibular fractures with plate and screw fixation hardware traversing the distal fibular fracture. There is now near anatomic alignment of the tibiotalar joint. No new fractures are identified. Surgical staples along the lateral aspect of the ankle. Diffuse soft tissue swelling. IMPRESSION: Sequelae of interval ORIF of distal tibia and fibular fractures with improved alignment and no acute adverse features. Electronically Signed   By: Michaeline Blanch M.D.   On: 11/25/2023 13:54   DG MINI C-ARM IMAGE ONLY Result Date: 11/25/2023 There is no interpretation for this exam.  This order is for images obtained during a surgical procedure.  Please See Surgeries Tab for more information regarding the procedure.   Chest Portable 1 View Result Date: 11/24/2023 CLINICAL DATA:  [id number] Volume overload [id number] EXAM: PORTABLE CHEST 1 VIEW COMPARISON:  None Available. FINDINGS: The heart and mediastinal contours are within normal limits. Low lung volumes. Left base atelectasis. No pulmonary edema. No pleural effusion. No pneumothorax. No acute osseous abnormality. IMPRESSION: Low lung volumes with left base atelectasis. Recommend repeat chest x-ray PA and lateral view for further evaluation. Electronically Signed   By: Morgane  Naveau M.D.   On: 11/24/2023 22:10   DG Ankle Complete Right Result Date: 11/24/2023 Please see detailed radiograph report in office note.  CT ANKLE RIGHT WO CONTRAST Result Date: 11/09/2023 CLINICAL DATA:  Fracture. EXAM: CT OF THE RIGHT ANKLE  WITHOUT CONTRAST TECHNIQUE: Multidetector CT imaging of the right ankle was performed according to the standard protocol. Multiplanar CT image reconstructions were also generated. RADIATION DOSE REDUCTION: This exam was performed according to the departmental dose-optimization program which includes automated exposure control, adjustment of the mA and/or kV according to patient size and/or use of iterative reconstruction technique. COMPARISON:  None Available. FINDINGS: Bones/Joint/Cartilage Oblique fracture of the distal fibular diaphysis with the distal fracture margin extending to the proximal aspect of the distal tibiofibular syndesmosis. There is 4 mm of lateral displacement of the distal fracture component. Posterior malleolar fracture demonstrates a 2-3 mm of superior displacement (series 8, image 50). Linear cortical fragments measuring up to 6 mm  adjacent to the anterolateral talar dome, along the expected course of the deltoid ligamentous complex, compatible with minimally displaced avulsion fracture. Similar appearing punctate osseous fragments adjacent to the anterior margin of the lateral malleolus likely reflect avulsion fracture fragments along the expected course of the anterior talofibular ligament Ankle mortise appears relatively congruent. Mild posterior subtalar osteoarthritis. Ligaments Ligaments are suboptimally evaluated by CT. Muscles and Tendons Muscles are within normal limits for age. No intramuscular fluid collection or hematoma. Soft tissue Soft tissue swelling of the ankle. No fluid collection or hematoma. No soft tissue mass. IMPRESSION: 1. Oblique displaced fracture of the distal fibular diaphysis with the distal fracture margin extending to the proximal aspect of the distal tibiofibular syndesmosis. 2. Mildly displaced posterior malleolar fracture. 3. Minimally displaced avulsion fracture along the expected course of the deltoid ligamentous complex at the anteromedial ankle. 4.  Avulsion fracture along the expected course of the anterior talofibular ligament at the anterolateral ankle. Electronically Signed   By: Harrietta Sherry M.D.   On: 11/09/2023 13:54      Discharge Exam: Vitals:   11/29/23 0412 11/29/23 0926  BP: 123/77 (!) 114/52  Pulse: 70 89  Resp: 16 17  Temp: 98.3 F (36.8 C) 98.2 F (36.8 C)  SpO2: 96% 92%   Vitals:   11/28/23 1551 11/28/23 1951 11/29/23 0412 11/29/23 0926  BP: (!) 104/55 138/68 123/77 (!) 114/52  Pulse: 72 75 70 89  Resp: 18 16 16 17   Temp: 98.1 F (36.7 C) 98.6 F (37 C) 98.3 F (36.8 C) 98.2 F (36.8 C)  TempSrc: Oral Oral Oral Oral  SpO2: 96% 91% 96% 92%  Weight:        Constitutional:  Normal appearance. Non toxic-appearing.  HENT: Head Normocephalic and atraumatic.  Mucous membranes are moist.  Eyes:  Extraocular intact. Conjunctivae normal.  Cardiovascular: Rate and Rhythm: Normal rate and regular rhythm.  Pulmonary: Non labored, symmetric rise of chest wall.  Skin: warm and dry. not jaundiced.  Foot bandaged C/D/I Neurological: No focal deficit present. alert. Oriented.  Psychiatric: Mood and Affect congruent.    The results of significant diagnostics from this hospitalization (including imaging, microbiology, ancillary and laboratory) are listed below for reference.     Microbiology: No results found for this or any previous visit (from the past 240 hours).   Labs: BNP (last 3 results) Recent Labs    11/26/23 0513  BNP 187.2*   Basic Metabolic Panel: Recent Labs  Lab 11/26/23 0513  NA 140  K 3.9  CL 102  CO2 31  GLUCOSE 170*  BUN 34*  CREATININE 1.21  CALCIUM 9.0   Liver Function Tests: No results for input(s): AST, ALT, ALKPHOS, BILITOT, PROT, ALBUMIN in the last 168 hours. No results for input(s): LIPASE, AMYLASE in the last 168 hours. No results for input(s): AMMONIA in the last 168 hours. CBC: Recent Labs  Lab 11/26/23 0513 11/29/23 0254  WBC 7.3 5.7  HGB  10.5* 10.0*  HCT 34.4* 32.5*  MCV 90.3 88.6  PLT 155 162   Cardiac Enzymes: No results for input(s): CKTOTAL, CKMB, CKMBINDEX, TROPONINI in the last 168 hours. BNP: Invalid input(s): POCBNP CBG: Recent Labs  Lab 11/28/23 1115 11/28/23 1614 11/28/23 2128 11/29/23 0754 11/29/23 1150  GLUCAP 204* 162* 209* 138* 214*   D-Dimer No results for input(s): DDIMER in the last 72 hours. Hgb A1c No results for input(s): HGBA1C in the last 72 hours. Lipid Profile No results for input(s): CHOL, HDL, LDLCALC, TRIG,  CHOLHDL, LDLDIRECT in the last 72 hours. Thyroid function studies No results for input(s): TSH, T4TOTAL, T3FREE, THYROIDAB in the last 72 hours.  Invalid input(s): FREET3 Anemia work up No results for input(s): VITAMINB12, FOLATE, FERRITIN, TIBC, IRON, RETICCTPCT in the last 72 hours. Urinalysis No results found for: COLORURINE, APPEARANCEUR, LABSPEC, PHURINE, GLUCOSEU, HGBUR, BILIRUBINUR, KETONESUR, PROTEINUR, UROBILINOGEN, NITRITE, LEUKOCYTESUR Sepsis Labs Recent Labs  Lab 11/26/23 0513 11/29/23 0254  WBC 7.3 5.7   Microbiology No results found for this or any previous visit (from the past 240 hours).   Time coordinating discharge: 32 min   SIGNED: Lorane Poland, MD  [facility] 11/29/2023, 12:27 PM Pager   If 7PM-7AM, please contact night-coverage

## 2023-11-29 NOTE — Progress Notes (Signed)
 Occupational Therapy Treatment Patient Details Name: [name] MRN: [id number] DOB: 1951/02/05 Today's Date: 11/29/2023   History of present illness Pt is a 73 y.o. male who was referred to the ER by [facility] for elective trimalleolar fracture repair s/p fall 3 weeks ago. Pt is now POD1 R ORIF trimalleolar ankle fracture and repair of right tibiofibular syndesmosis injury. PMH of morbid obesity, hypertension, DM2, lymphedema, grade 2 diastolic dysfunction, CKD stage IIIa   OT comments  Pt seen for OT/PT co-tx. Pt long sitting in bed with CAM boot donned, pre-medicated for pain and agreeable to therapy. Pt found to have large, loose incontinent BM requiring increased time and multiple bouts of pericare for hygiene. Pt unable to tell when he is having a BM. Pt able to roll L<>R and back to supine with MIN A +1, MAX A for pericare, and dons clean gown with MIN A bed level. Pt hopeful to discharge soon. Discharge recommendation appropriate, OT will continue to follow.       If plan is discharge home, recommend the following:  Two people to help with walking and/or transfers;A lot of help with bathing/dressing/bathroom;Assistance with cooking/housework;Assist for transportation   Equipment Recommendations  Other (comment);BSC/3in1;Wheelchair (measurements OT);Hospital bed;Hoyer lift       Precautions / Restrictions Precautions Precautions: Fall Recall of Precautions/Restrictions: Intact Required Braces or Orthoses: Other Brace Other Brace: CAM Boot RLE Restrictions Weight Bearing Restrictions Per Provider Order: Yes RLE Weight Bearing Per Provider Order: Non weight bearing Other Position/Activity Restrictions: CAM boot not to be removed at any time       Mobility Bed Mobility Overal bed mobility: Needs Assistance   Rolling: Min assist, +2 for physical assistance         General bed mobility comments: pt rolling R<>L and back to supine for multiple bouts of  pericare after multiple large loose incontinent BMs    Transfers Overall transfer level: Needs assistance                 General transfer comment: NT due to the need for pericare bed level     Balance Overall balance assessment: Needs assistance     Sitting balance - Comments: NT       Standing balance comment: NT                           ADL either performed or assessed with clinical judgement   ADL Overall ADL's : Needs assistance/impaired                             Toileting- Clothing Manipulation and Hygiene: Maximal assistance;+2 for physical assistance;Bed level Toileting - Clothing Manipulation Details (indicate cue type and reason): Pt with large, loose, incontinent BM in bed. Pt voices that he is unaware and cannot tell when he needs to have a BM. MAX A for pericare bed level. Pt continuing to have BM, placed on bed pan, rolling R<>L for pericare       General ADL Comments: Session focused on bed level pericare after incontinent BM. Pt having multiple BMs during session, requiring increased time and repetitive pericare with bed pad changes.     Communication Communication Communication: Impaired Factors Affecting Communication: Hearing impaired   Cognition Arousal: Alert Behavior During Therapy: WFL for tasks assessed/performed Cognition: No apparent impairments  OT - Cognition Comments: slow processing at times                 Following commands: Intact        Cueing   Cueing Techniques: Verbal cues, Tactile cues, Gestural cues        General Comments Pt doffed [place] upon arrival, on RA with Spo2 at 96%    Pertinent Vitals/ Pain       Pain Assessment Pain Assessment: No/denies pain   Frequency  Min 2X/week        Progress Toward Goals  OT Goals(current goals can now be found in the care plan section)  Progress towards OT goals: Progressing toward goals  Acute Rehab OT Goals OT Goal  Formulation: With patient Time For Goal Achievement: 12/10/23 Potential to Achieve Goals: Fair ADL Goals Pt Will Perform Upper Body Bathing: with supervision;sitting Pt Will Perform Lower Body Dressing: with min assist;with adaptive equipment;sitting/lateral leans;sit to/from stand Pt Will Transfer to Toilet: stand pivot transfer;bedside commode;with min assist;with mod assist  Plan      Co-evaluation    PT/OT/SLP Co-Evaluation/Treatment: Yes Reason for Co-Treatment: For patient/therapist safety;To address functional/ADL transfers PT goals addressed during session: Mobility/safety with mobility;Balance;Proper use of DME OT goals addressed during session: ADL's and self-care;Proper use of Adaptive equipment and DME      AM-PAC OT 6 Clicks Daily Activity     Outcome Measure   Help from another person eating meals?: None Help from another person taking care of personal grooming?: A Little Help from another person toileting, which includes using toliet, bedpan, or urinal?: Total Help from another person bathing (including washing, rinsing, drying)?: Total Help from another person to put on and taking off regular upper body clothing?: A Lot Help from another person to put on and taking off regular lower body clothing?: A Lot 6 Click Score: 13    End of Session    OT Visit Diagnosis: Other abnormalities of gait and mobility (R26.89);Unsteadiness on feet (R26.81);Muscle weakness (generalized) (M62.81);Pain   Activity Tolerance Patient tolerated treatment well   Patient Left in bed;with call bell/phone within reach;with bed alarm set   Nurse Communication Mobility status        Time: 8670-8640 OT Time Calculation (min): 30 min  Charges: OT General Charges $OT Visit: 1 Visit OT Treatments $Self Care/Home Management : 8-22 mins  Daine Croker L. Gissela Bloch, OTR/L  11/29/23, 2:10 PM

## 2023-11-29 NOTE — TOC Transition Note (Signed)
 Transition of Care [facility]) - Discharge Note   Patient Details  Name: [name] MRN: [id number] Date of Birth: 1950-11-04  [facility] of [facility]) CM/SW Contact:  Corean ONEIDA Haddock, RN Phone Number: 11/29/2023, 1:39 PM   Clinical Narrative:        Insurance auth received auth id [id number] Auth id for [facility] [id number]   [facility]tic[facility]ated DC date: 11/29/23  Family notified: cousin gail  Transport by: [employer], contracting  Per MD patient ready for DC to . RN, patient, patient's family, and facility notified of DC. Discharge Summary sent to facility. RN given number for report. DC packet on chart. Ambulance transport requested for patient.   TOC signing off.    Patient Goals and CMS Choice            Discharge Placement                       Discharge Plan and Services Additional resources added to the After Visit Summary for                                       Social Drivers of Health (SDOH) Interventions SDOH Screenings   Food Insecurity: No Food Insecurity (11/24/2023)  Housing: Unknown (11/24/2023)  Transportation Needs: No Transportation Needs (11/24/2023)  Utilities: Not At Risk (11/24/2023)  Social Connections: Unknown (11/24/2023)  Tobacco Use: Medium Risk (11/24/2023)     Readmission Risk Interventions     No data to display

## 2023-11-29 NOTE — TOC Progression Note (Signed)
 Transition of Care [facility]) - Progression Note    Patient Details  Name: [name] MRN: [id number] Date of Birth: 1951-05-09  Transition of Care [facility]) CM/SW Contact  Corean ONEIDA Haddock, RN Phone Number: 11/29/2023, 9:35 AM  Clinical Narrative:      Shara pending for [facility]                    Expected Discharge Plan and Services                                               Social Drivers of Health (SDOH) Interventions SDOH Screenings   Food Insecurity: No Food Insecurity (11/24/2023)  Housing: Unknown (11/24/2023)  Transportation Needs: No Transportation Needs (11/24/2023)  Utilities: Not At Risk (11/24/2023)  Social Connections: Unknown (11/24/2023)  Tobacco Use: Medium Risk (11/24/2023)    Readmission Risk Interventions     No data to display

## 2023-12-01 ENCOUNTER — Ambulatory Visit (INDEPENDENT_AMBULATORY_CARE_PROVIDER_SITE_OTHER): Admitting: Podiatry

## 2023-12-01 ENCOUNTER — Encounter: Payer: Self-pay | Admitting: Podiatry

## 2023-12-01 VITALS — Ht 73.0 in | Wt 341.7 lb

## 2023-12-01 DIAGNOSIS — S82852A Displaced trimalleolar fracture of left lower leg, initial encounter for closed fracture: Secondary | ICD-10-CM

## 2023-12-05 NOTE — Progress Notes (Signed)
   Chief Complaint  Patient presents with   Routine Post Op    Subjective:  Patient presents today status post ORIF trimalleolar ankle fracture left performed inpatient [facility] hospital.  DOS: 11/25/2023.  Patient doing well.  Currently residing at [facility].  NWB currently in a wheelchair with a cam boot intact.  Past Medical History:  Diagnosis Date   Diabetes mellitus without complication (HCC)    Hyperlipidemia    Hypertension    Leukemia (HCC)     Past Surgical History:  Procedure Laterality Date   CATARACT EXTRACTION Right    COLONOSCOPY WITH PROPOFOL  N/A 12/13/2021   Procedure: COLONOSCOPY WITH PROPOFOL ;  Surgeon: [name], MD;  Location: [facility] ENDOSCOPY;  Service: Endoscopy;  Laterality: N/A;   ORIF ANKLE FRACTURE Right 11/25/2023   Procedure: OPEN REDUCTION INTERNAL FIXATION (ORIF) ANKLE FRACTURE;  Surgeon: Janit Thresa HERO, DPM;  Location: [facility] ORS;  Service: Orthopedics/Podiatry;  Laterality: Right;   TONSILLECTOMY     TOOTH EXTRACTION      No Known Allergies  Objective/Physical Exam Neurovascular status intact.  Incision well coapted with staples intact.  Incisional wound VAC in place without complication.  No sign of infectious process noted. No dehiscence. No active bleeding noted.  Moderate edema noted to the surgical extremity.  DG Ankle Complete Right 11/25/2023 FINDINGS: Sequelae of interval reduction of distal tibia and fibular fractures with plate and screw fixation hardware traversing the distal fibular fracture. There is now near anatomic alignment of the tibiotalar joint. No new fractures are identified. Surgical staples along the lateral aspect of the ankle. Diffuse soft tissue swelling.   IMPRESSION: Sequelae of interval ORIF of distal tibia and fibular fractures with improved alignment and no acute adverse features.  Assessment: 1. s/p ORIF ankle fracture right.  DOS: 11/25/2023  Plan of Care:  -Patient was evaluated. -Incisional wound  VAC removed.  Dressings applied -Orders placed for dressing changes at [facility] -Continue NWB in CAM boot -Return to clinic 2 weeks for staple removal  *Cousins name is Alisa Thresa EMERSON Janit, DPM [facility]  Dr. Thresa EMERSON Janit, DPM    [address] [place], [place] [address]                Office [phone]  Fax [phone]

## 2023-12-15 ENCOUNTER — Ambulatory Visit

## 2023-12-15 ENCOUNTER — Encounter: Payer: Self-pay | Admitting: Podiatry

## 2023-12-15 ENCOUNTER — Ambulatory Visit: Admitting: Podiatry

## 2023-12-15 DIAGNOSIS — S82852A Displaced trimalleolar fracture of left lower leg, initial encounter for closed fracture: Secondary | ICD-10-CM

## 2023-12-15 NOTE — Progress Notes (Signed)
 Chief Complaint  Patient presents with   Routine Post Op    POV #2 dos 11/25/23 Closed displaced trimalleolar fracture of left ankle, pt states everything is going well continues to wear cam boot and bone stimulator.    Subjective:  Patient presents today status post ORIF trimalleolar ankle fracture left performed inpatient [facility] hospital.  DOS: 11/25/2023.  Patient doing well.  Currently residing at [facility].  NWB currently in a wheelchair with a cam boot intact.  There has been some frustration with dressing changes not performed routinely.  The immobilization cam boot has also not been applied to the lower extremity continuously.  The family has also noticed an for placement of the bone stimulator.  There is also concern with the family that [facility] has stated that insurance will no longer pay for him to be there although it is medically necessary and he lives alone and they will need to pay out-of-pocket  Past Medical History:  Diagnosis Date   Diabetes mellitus without complication (HCC)    Hyperlipidemia    Hypertension    Leukemia (HCC)     Past Surgical History:  Procedure Laterality Date   CATARACT EXTRACTION Right    COLONOSCOPY WITH PROPOFOL  N/A 12/13/2021   Procedure: COLONOSCOPY WITH PROPOFOL ;  Surgeon: [name], MD;  Location: [facility] ENDOSCOPY;  Service: Endoscopy;  Laterality: N/A;   ORIF ANKLE FRACTURE Right 11/25/2023   Procedure: OPEN REDUCTION INTERNAL FIXATION (ORIF) ANKLE FRACTURE;  Surgeon: Janit Thresa HERO, DPM;  Location: [facility] ORS;  Service: Orthopedics/Podiatry;  Laterality: Right;   TONSILLECTOMY     TOOTH EXTRACTION      No Known Allergies  Objective/Physical Exam Neurovascular status intact.  Incision well coapted with staples intact. No dehiscence. No active bleeding noted.  Chronic venous insufficiency noted  DG Ankle Complete Right 11/25/2023 FINDINGS: Sequelae of interval reduction of distal tibia and fibular fractures with plate  and screw fixation hardware traversing the distal fibular fracture. There is now near anatomic alignment of the tibiotalar joint. No new fractures are identified. Surgical staples along the lateral aspect of the ankle. Diffuse soft tissue swelling.   IMPRESSION: Sequelae of interval ORIF of distal tibia and fibular fractures with improved alignment and no acute adverse features.  Radiographic exam RT ankle 12/15/2023 Tibiotalar joint congruent.  Orthopedic hardware intact.  Near anatomical realignment of the ankle mortise.   Assessment: 1. s/p ORIF ankle fracture right.  DOS: 11/25/2023  Plan of Care:  -Patient was evaluated. -Staples removed -Given the severity of the ankle fracture, it is mandatory that the patient continues to be nonweightbearing to the right lower extremity.  It is medically necessary that he stays at [facility] since he lives alone and is unable to be nonweightbearing independently.  His sister and cousin is unable to properly care for him and assist him.  -Continue strict nonweightbearing to the surgical extremity for an additional 4 weeks -Dressings changed. -Dressing change orders to [facility] every other day:  -Apply large Xeroform nonadherent sheet over the incision site as well as any areas of skin breakdown  -Wrap loosely with large Kerlex  -Two 4 Ace wraps to wrap up to the knee  - Reapply cam boot  - Apply the external bone stimulator placed over the lateral aspect of the ankle 3hrs/day. Recharge daily. -Again, my concern if the patient is discharged home without continued monitoring care.  He will be high risk of reapplying pressure and ruining the surgery as well  as high risk of falling which would be very detrimental to the patient. -Return to clinic in 4 weeks for follow-up x-ray and possibly begin weightbearing and physical therapy.  I would recommend at this time he may be discharged after PT eval at [facility] for weightbearing in the cam  boot with the assistance of a walker  *Cousins name is Alisa Thresa EMERSON Janit, DPM [facility]  Dr. Thresa EMERSON Janit, DPM    [address] [place], [place] [address]                Office [phone]  Fax [phone]

## 2023-12-15 NOTE — Addendum Note (Signed)
 Addended by: NICHOLAUS MARJORIE SAILOR on: 12/15/2023 10:55 AM   Modules accepted: Orders

## 2024-01-09 ENCOUNTER — Ambulatory Visit

## 2024-01-09 ENCOUNTER — Ambulatory Visit (INDEPENDENT_AMBULATORY_CARE_PROVIDER_SITE_OTHER): Admitting: Podiatry

## 2024-01-09 ENCOUNTER — Ambulatory Visit (INDEPENDENT_AMBULATORY_CARE_PROVIDER_SITE_OTHER)

## 2024-01-09 ENCOUNTER — Encounter: Payer: Self-pay | Admitting: Podiatry

## 2024-01-09 DIAGNOSIS — S82852A Displaced trimalleolar fracture of left lower leg, initial encounter for closed fracture: Secondary | ICD-10-CM

## 2024-01-09 NOTE — Progress Notes (Signed)
   Chief Complaint  Patient presents with   Routine Post Op    POV #3 DOS 11/25/23 Closed displaced trimalleolar fracture of left ankle,    Subjective:  Patient presents today status post ORIF trimalleolar ankle fracture left performed inpatient [facility] hospital.  DOS: 11/25/2023.  Patient doing well.  Currently residing at [facility].  NWB currently in a wheelchair with a cam boot intact.  Orders to apply dressing changes daily at [facility].  There has been concern for lack of dressing changes at [facility].  Family submitted an appeal to continue care at [facility] which was approved.  Past Medical History:  Diagnosis Date   Diabetes mellitus without complication (HCC)    Hyperlipidemia    Hypertension    Leukemia (HCC)     Past Surgical History:  Procedure Laterality Date   CATARACT EXTRACTION Right    COLONOSCOPY WITH PROPOFOL  N/A 12/13/2021   Procedure: COLONOSCOPY WITH PROPOFOL ;  Surgeon: [name], MD;  Location: [facility] ENDOSCOPY;  Service: Endoscopy;  Laterality: N/A;   ORIF ANKLE FRACTURE Right 11/25/2023   Procedure: OPEN REDUCTION INTERNAL FIXATION (ORIF) ANKLE FRACTURE;  Surgeon: Janit Thresa HERO, DPM;  Location: [facility] ORS;  Service: Orthopedics/Podiatry;  Laterality: Right;   TONSILLECTOMY     TOOTH EXTRACTION      No Known Allergies  Objective/Physical Exam Neurovascular status intact.  Incision is nicely healed.  No open wounds noted to the lower extremity.  Chronic venous insufficiency is noted.  DG Ankle Complete Right 11/25/2023 FINDINGS: Sequelae of interval reduction of distal tibia and fibular fractures with plate and screw fixation hardware traversing the distal fibular fracture. There is now near anatomic alignment of the tibiotalar joint. No new fractures are identified. Surgical staples along the lateral aspect of the ankle. Diffuse soft tissue swelling.   IMPRESSION: Sequelae of interval ORIF of distal tibia and fibular fractures  with improved alignment and no acute adverse features.  Radiographic exam RT ankle 01/09/2024 Unchanged with routine healing.  Tibiotalar joint congruent.  Orthopedic hardware intact.  Near anatomical realignment of the ankle mortise.   Assessment: 1. s/p ORIF ankle fracture right.  DOS: 11/25/2023  Plan of Care:  -Patient was evaluated. -Overall the patient is doing very well and healing routinely.  He is now able to be weightbearing in the cam boot with the assistance of a walker.  Recommend physical therapy at [facility] to assist with rehab until discharge. -Recommend discharge on 01/22/2024 pending physical therapy evaluation and as long as the patient is able to weight-bear in the cam boot with the assistance of the walker unassisted. -Continue external bone stimulator daily -Discontinue wound dressing changes.  Recommend compression wraps daily.  Patient's family has the compression wraps to apply to the lower extremity -Return to clinic with me 6 weeks for follow-up x-ray  *Cousins name is Alisa Thresa EMERSON Janit, DPM [facility]  Dr. Thresa EMERSON Janit, DPM    [address] [place], [place] [address]                Office [phone]  Fax [phone]

## 2024-02-16 ENCOUNTER — Ambulatory Visit (INDEPENDENT_AMBULATORY_CARE_PROVIDER_SITE_OTHER): Admitting: Podiatry

## 2024-02-16 ENCOUNTER — Encounter: Payer: Self-pay | Admitting: Podiatry

## 2024-02-16 ENCOUNTER — Ambulatory Visit (INDEPENDENT_AMBULATORY_CARE_PROVIDER_SITE_OTHER)

## 2024-02-16 VITALS — Ht 73.0 in | Wt 341.2 lb

## 2024-02-16 DIAGNOSIS — S82852A Displaced trimalleolar fracture of left lower leg, initial encounter for closed fracture: Secondary | ICD-10-CM | POA: Diagnosis not present

## 2024-02-16 NOTE — Progress Notes (Signed)
   Chief Complaint  Patient presents with   Routine Post Op     POV #4 DOS 11/25/23 Closed displaced trimalleolar fracture of left ankle, he states the ankle is a lot better, no longer wheelchair to ambulate states pain level is at 4-5, no other complaints.      Subjective:  Patient presents today status post ORIF trimalleolar ankle fracture left performed inpatient [facility] hospital.  DOS: 11/25/2023.  Doing well.  WBAT in the cam boot with the assistance of a walker.  He is now at home.  Both PT and OT come to the home for therapy  Past Medical History:  Diagnosis Date   Diabetes mellitus without complication (HCC)    Hyperlipidemia    Hypertension    Leukemia (HCC)     Past Surgical History:  Procedure Laterality Date   CATARACT EXTRACTION Right    COLONOSCOPY WITH PROPOFOL  N/A 12/13/2021   Procedure: COLONOSCOPY WITH PROPOFOL ;  Surgeon: [name], MD;  Location: [facility] ENDOSCOPY;  Service: Endoscopy;  Laterality: N/A;   ORIF ANKLE FRACTURE Right 11/25/2023   Procedure: OPEN REDUCTION INTERNAL FIXATION (ORIF) ANKLE FRACTURE;  Surgeon: Janit Thresa HERO, DPM;  Location: [facility] ORS;  Service: Orthopedics/Podiatry;  Laterality: Right;   TONSILLECTOMY     TOOTH EXTRACTION      No Known Allergies  Objective/Physical Exam Neurovascular status intact.  Incision is nicely healed.  No open wounds noted to the lower extremity.  Chronic venous insufficiency is noted.  DG Ankle Complete Right 11/25/2023 FINDINGS: Sequelae of interval reduction of distal tibia and fibular fractures with plate and screw fixation hardware traversing the distal fibular fracture. There is now near anatomic alignment of the tibiotalar joint. No new fractures are identified. Surgical staples along the lateral aspect of the ankle. Diffuse soft tissue swelling.   IMPRESSION: Sequelae of interval ORIF of distal tibia and fibular fractures with improved alignment and no acute adverse features.  Radiographic  exam RT ankle 02/16/2024 Tibiotalar joint congruent.  Orthopedic hardware intact and stable without complication.  Near anatomical realignment of the ankle mortise.   Assessment: 1. s/p ORIF ankle fracture right.  DOS: 11/25/2023  Plan of Care:  -Patient was evaluated. -Okay to discontinue cam boot.  Recommend good supportive tennis shoes with the assistance of a walker -Continue PT and OT at home as long as it continues to be beneficial -From a surgical standpoint okay for full activity no restrictions -Return to clinic PRN  *Cousins name is Alisa Thresa EMERSON Janit, DPM [facility]  Dr. Thresa EMERSON Janit, DPM    [address] [place], [place] [address]                Office [phone]  Fax [phone]

## 2024-03-07 ENCOUNTER — Encounter (HOSPITAL_BASED_OUTPATIENT_CLINIC_OR_DEPARTMENT_OTHER): Payer: Self-pay | Admitting: Internal Medicine

## 2024-03-07 DIAGNOSIS — R0681 Apnea, not elsewhere classified: Secondary | ICD-10-CM

## 2024-03-07 DIAGNOSIS — R0683 Snoring: Secondary | ICD-10-CM

## 2024-03-07 DIAGNOSIS — J9601 Acute respiratory failure with hypoxia: Secondary | ICD-10-CM
